# Patient Record
Sex: Male | Born: 1965 | Race: Black or African American | Hispanic: No | Marital: Single | Smoking: Current every day smoker
Health system: Southern US, Community
[De-identification: ages and names within clinical notes are randomized; demographics above are authoritative.]

## PROBLEM LIST (undated history)

## (undated) DIAGNOSIS — D649 Anemia, unspecified: Secondary | ICD-10-CM

## (undated) DIAGNOSIS — Z992 Dependence on renal dialysis: Secondary | ICD-10-CM

## (undated) DIAGNOSIS — Z972 Presence of dental prosthetic device (complete) (partial): Secondary | ICD-10-CM

## (undated) DIAGNOSIS — I10 Essential (primary) hypertension: Secondary | ICD-10-CM

## (undated) DIAGNOSIS — N186 End stage renal disease: Secondary | ICD-10-CM

## (undated) DIAGNOSIS — N189 Chronic kidney disease, unspecified: Secondary | ICD-10-CM

## (undated) DIAGNOSIS — Z9289 Personal history of other medical treatment: Secondary | ICD-10-CM

## (undated) DIAGNOSIS — R0600 Dyspnea, unspecified: Secondary | ICD-10-CM

## (undated) DIAGNOSIS — F1010 Alcohol abuse, uncomplicated: Secondary | ICD-10-CM

## (undated) DIAGNOSIS — N529 Male erectile dysfunction, unspecified: Secondary | ICD-10-CM

## (undated) HISTORY — PX: NO PAST SURGERIES: SHX2092

---

## 2000-02-24 ENCOUNTER — Encounter: Payer: Self-pay | Admitting: *Deleted

## 2000-02-24 ENCOUNTER — Emergency Department (HOSPITAL_COMMUNITY): Admission: EM | Admit: 2000-02-24 | Discharge: 2000-02-24 | Payer: Self-pay | Admitting: *Deleted

## 2000-02-27 ENCOUNTER — Ambulatory Visit (HOSPITAL_BASED_OUTPATIENT_CLINIC_OR_DEPARTMENT_OTHER): Admission: RE | Admit: 2000-02-27 | Discharge: 2000-02-27 | Payer: Self-pay | Admitting: *Deleted

## 2002-01-17 ENCOUNTER — Emergency Department (HOSPITAL_COMMUNITY): Admission: EM | Admit: 2002-01-17 | Discharge: 2002-01-17 | Payer: Self-pay | Admitting: Emergency Medicine

## 2002-01-17 ENCOUNTER — Encounter: Payer: Self-pay | Admitting: Emergency Medicine

## 2013-01-31 ENCOUNTER — Encounter (HOSPITAL_COMMUNITY): Payer: Self-pay | Admitting: Emergency Medicine

## 2013-01-31 ENCOUNTER — Emergency Department (HOSPITAL_COMMUNITY)
Admission: EM | Admit: 2013-01-31 | Discharge: 2013-02-01 | Disposition: A | Discharge status: Home / Self Care | Payer: Self-pay | Attending: Emergency Medicine | Admitting: Emergency Medicine

## 2013-01-31 DIAGNOSIS — W19XXXA Unspecified fall, initial encounter: Secondary | ICD-10-CM

## 2013-01-31 DIAGNOSIS — S0003XA Contusion of scalp, initial encounter: Secondary | ICD-10-CM | POA: Insufficient documentation

## 2013-01-31 DIAGNOSIS — F1010 Alcohol abuse, uncomplicated: Secondary | ICD-10-CM | POA: Insufficient documentation

## 2013-01-31 DIAGNOSIS — S0101XA Laceration without foreign body of scalp, initial encounter: Secondary | ICD-10-CM

## 2013-01-31 DIAGNOSIS — S0100XA Unspecified open wound of scalp, initial encounter: Secondary | ICD-10-CM | POA: Insufficient documentation

## 2013-01-31 DIAGNOSIS — S199XXA Unspecified injury of neck, initial encounter: Secondary | ICD-10-CM | POA: Insufficient documentation

## 2013-01-31 DIAGNOSIS — S0993XA Unspecified injury of face, initial encounter: Secondary | ICD-10-CM | POA: Insufficient documentation

## 2013-01-31 DIAGNOSIS — F10929 Alcohol use, unspecified with intoxication, unspecified: Secondary | ICD-10-CM

## 2013-01-31 DIAGNOSIS — IMO0002 Reserved for concepts with insufficient information to code with codable children: Secondary | ICD-10-CM | POA: Insufficient documentation

## 2013-01-31 DIAGNOSIS — M25521 Pain in right elbow: Secondary | ICD-10-CM

## 2013-01-31 DIAGNOSIS — S060X1A Concussion with loss of consciousness of 30 minutes or less, initial encounter: Secondary | ICD-10-CM | POA: Insufficient documentation

## 2013-01-31 DIAGNOSIS — S1093XA Contusion of unspecified part of neck, initial encounter: Secondary | ICD-10-CM | POA: Insufficient documentation

## 2013-01-31 DIAGNOSIS — Y9389 Activity, other specified: Secondary | ICD-10-CM | POA: Insufficient documentation

## 2013-01-31 DIAGNOSIS — R040 Epistaxis: Secondary | ICD-10-CM | POA: Insufficient documentation

## 2013-01-31 DIAGNOSIS — Y92410 Unspecified street and highway as the place of occurrence of the external cause: Secondary | ICD-10-CM | POA: Insufficient documentation
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
## 2013-02-01 ENCOUNTER — Emergency Department (HOSPITAL_COMMUNITY): Payer: Self-pay

## 2013-02-01 LAB — COMPREHENSIVE METABOLIC PANEL
ALT: 79 U/L — ABNORMAL HIGH (ref 0–53)
AST: 52 U/L — ABNORMAL HIGH (ref 0–37)
Albumin: 3.5 g/dL (ref 3.5–5.2)
Alkaline Phosphatase: 84 U/L (ref 39–117)
BUN: 24 mg/dL — ABNORMAL HIGH (ref 6–23)
CO2: 21 mEq/L (ref 19–32)
Calcium: 9.5 mg/dL (ref 8.4–10.5)
Chloride: 105 mEq/L (ref 96–112)
Creatinine, Ser: 2.81 mg/dL — ABNORMAL HIGH (ref 0.50–1.35)
GFR calc Af Amer: 30 mL/min — ABNORMAL LOW (ref >90)
GFR calc non Af Amer: 26 mL/min — ABNORMAL LOW (ref >90)
Glucose, Bld: 83 mg/dL (ref 70–99)
Potassium: 4.2 mEq/L (ref 3.5–5.1)
Sodium: 141 mEq/L (ref 135–145)
Total Bilirubin: 0.6 mg/dL (ref 0.3–1.2)
Total Protein: 7.3 g/dL (ref 6.0–8.3)

## 2013-02-01 LAB — CBC WITH DIFFERENTIAL/PLATELET
Basophils Absolute: 0.1 10*3/uL (ref 0.0–0.1)
Basophils Relative: 0 % (ref 0–1)
Eosinophils Absolute: 0.2 10*3/uL (ref 0.0–0.7)
Eosinophils Relative: 1 % (ref 0–5)
HCT: 43.7 % (ref 39.0–52.0)
Hemoglobin: 15.5 g/dL (ref 13.0–17.0)
Lymphocytes Relative: 6 % — ABNORMAL LOW (ref 12–46)
Lymphs Abs: 0.9 10*3/uL (ref 0.7–4.0)
MCH: 34.9 pg — ABNORMAL HIGH (ref 26.0–34.0)
MCHC: 35.5 g/dL (ref 30.0–36.0)
MCV: 98.4 fL (ref 78.0–100.0)
Monocytes Absolute: 0.9 10*3/uL (ref 0.1–1.0)
Monocytes Relative: 6 % (ref 3–12)
Neutro Abs: 13.5 10*3/uL — ABNORMAL HIGH (ref 1.7–7.7)
Neutrophils Relative %: 87 % — ABNORMAL HIGH (ref 43–77)
Platelets: 169 10*3/uL (ref 150–400)
RBC: 4.44 MIL/uL (ref 4.22–5.81)
RDW: 12.4 % (ref 11.5–15.5)
WBC: 15.6 10*3/uL — ABNORMAL HIGH (ref 4.0–10.5)

## 2013-02-01 LAB — ETHANOL: Alcohol, Ethyl (B): 125 mg/dL — ABNORMAL HIGH (ref 0–11)

## 2013-02-01 IMAGING — CR DG PELVIS 1-2V
1 series · 1 of 1 positions shown · non-contrast
Comparison: None.

CLINICAL DATA: Fall

PELVIS - 1-2 VIEW

[t pelvis ap]
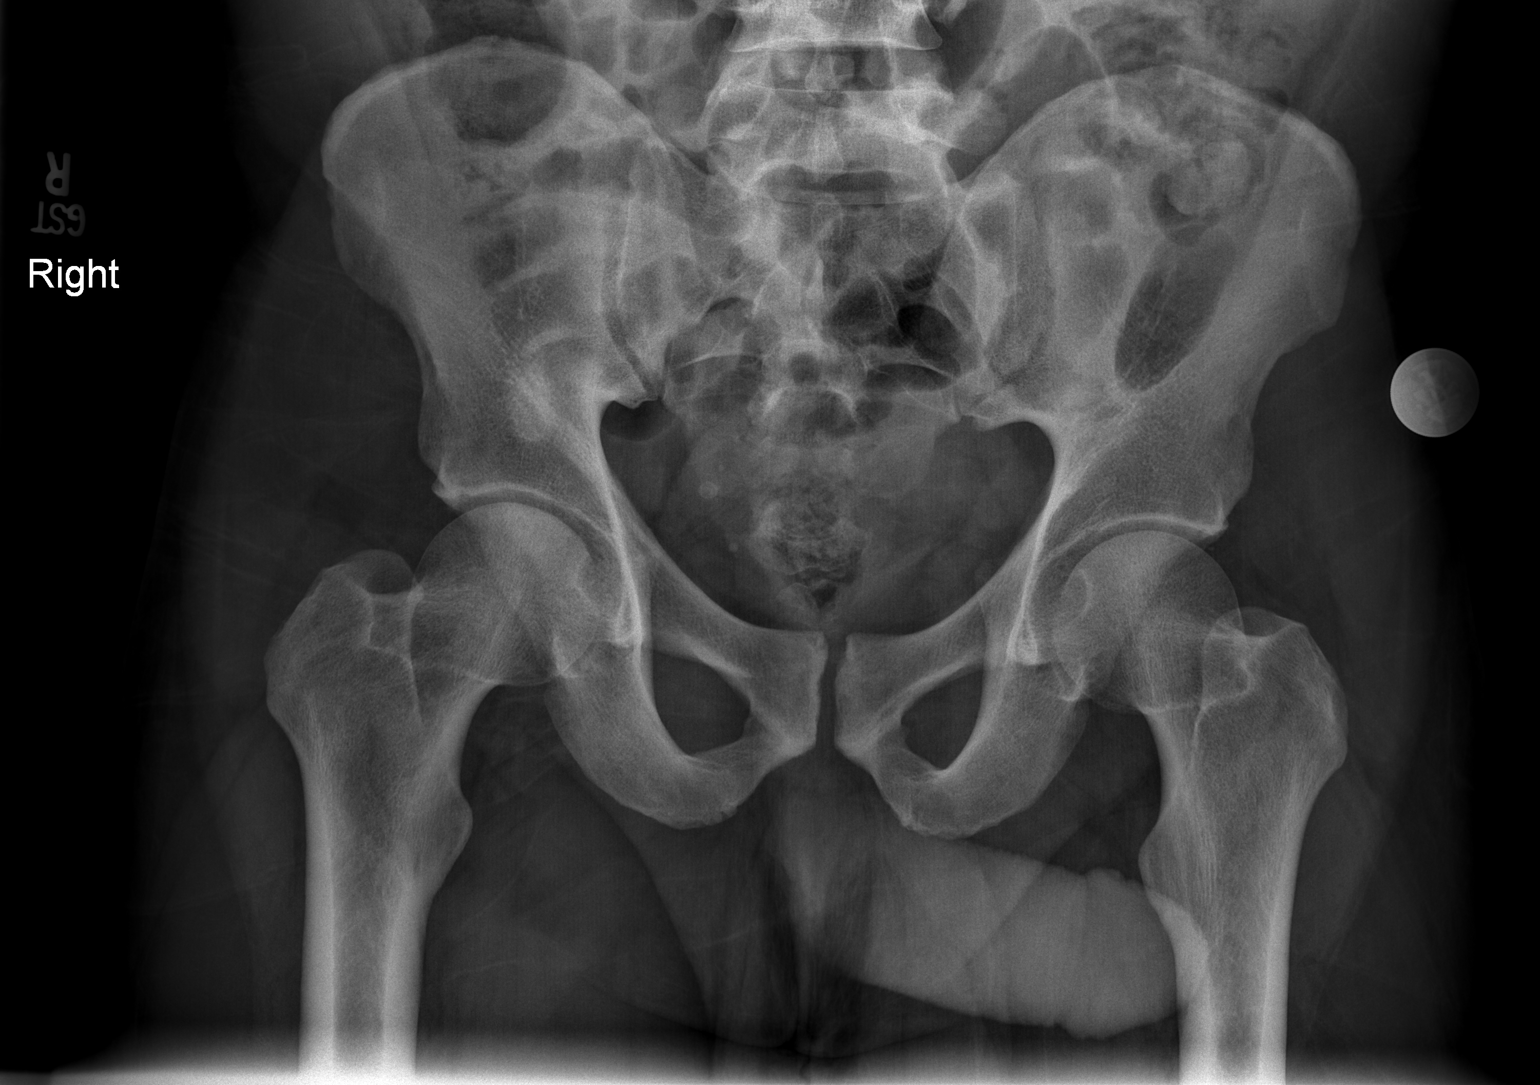

[1 of 1 positions shown; findings below may reference images not displayed]

FINDINGS: No displaced fracture.  Femoral heads project over the
acetabula.  There is an oval sclerotic focus projecting over the
right iliac bone.
IMPRESSION: No displaced fracture identified on this single view of the pelvis.
Recommend dedicated views if there is a focal area of concern.

Oval sclerotic focus right iliac bone. If no outside prior imaging
is available for comparison, consider bone scan to further
evaluate.

## 2013-02-01 IMAGING — CR DG ELBOW COMPLETE 3+V*R*
5 series · 5 of 5 positions shown · non-contrast
Comparison: None.

CLINICAL DATA: Fall, pain, laceration.

RIGHT ELBOW - COMPLETE 3+ VIEW

[x elbow ap right]
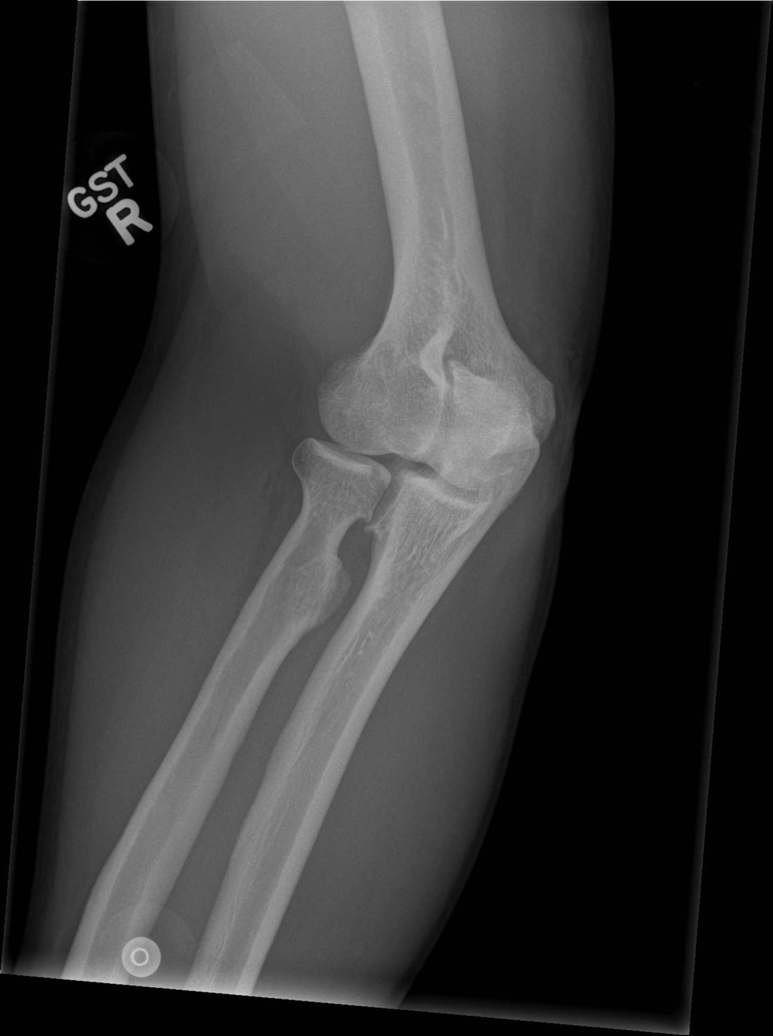

[x elbow obl right (1 of 2)]
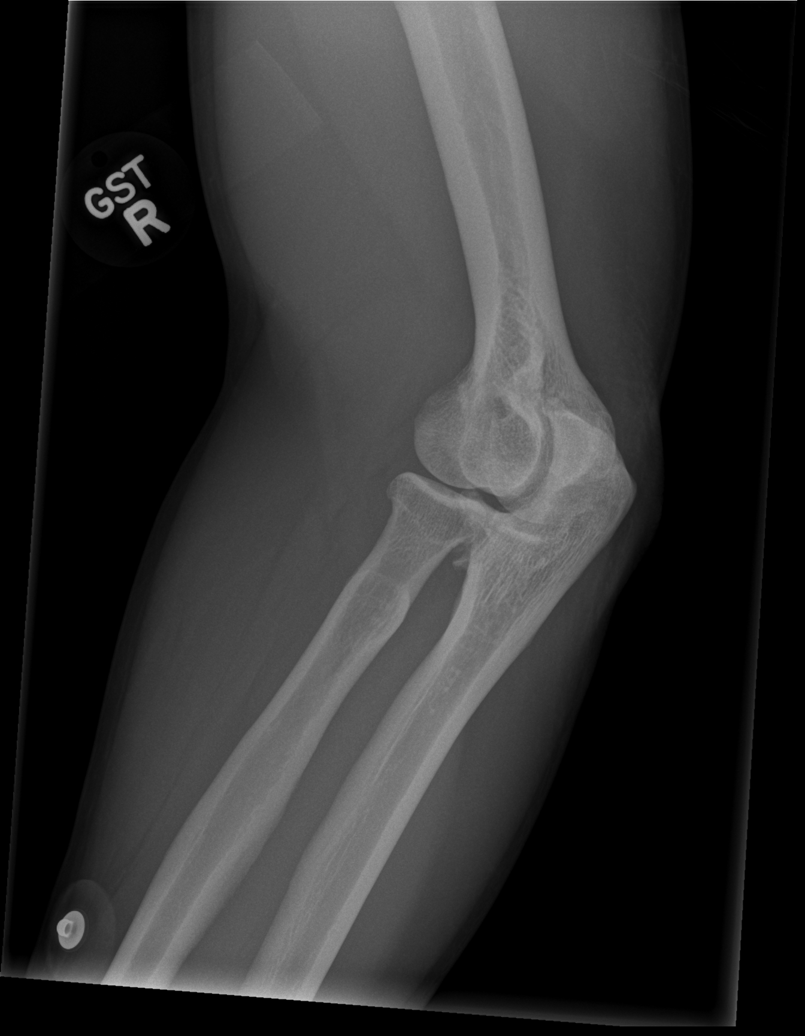

[x elbow obl right (2 of 2)]
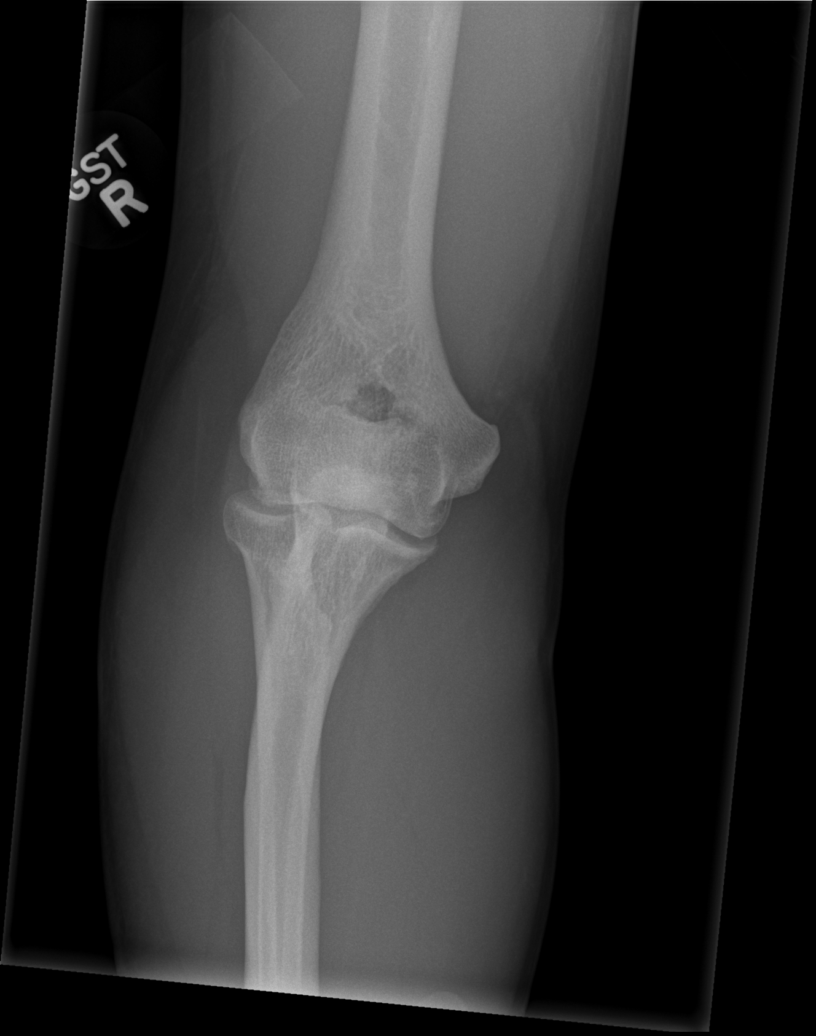

[x elbow lat right (1 of 2)]
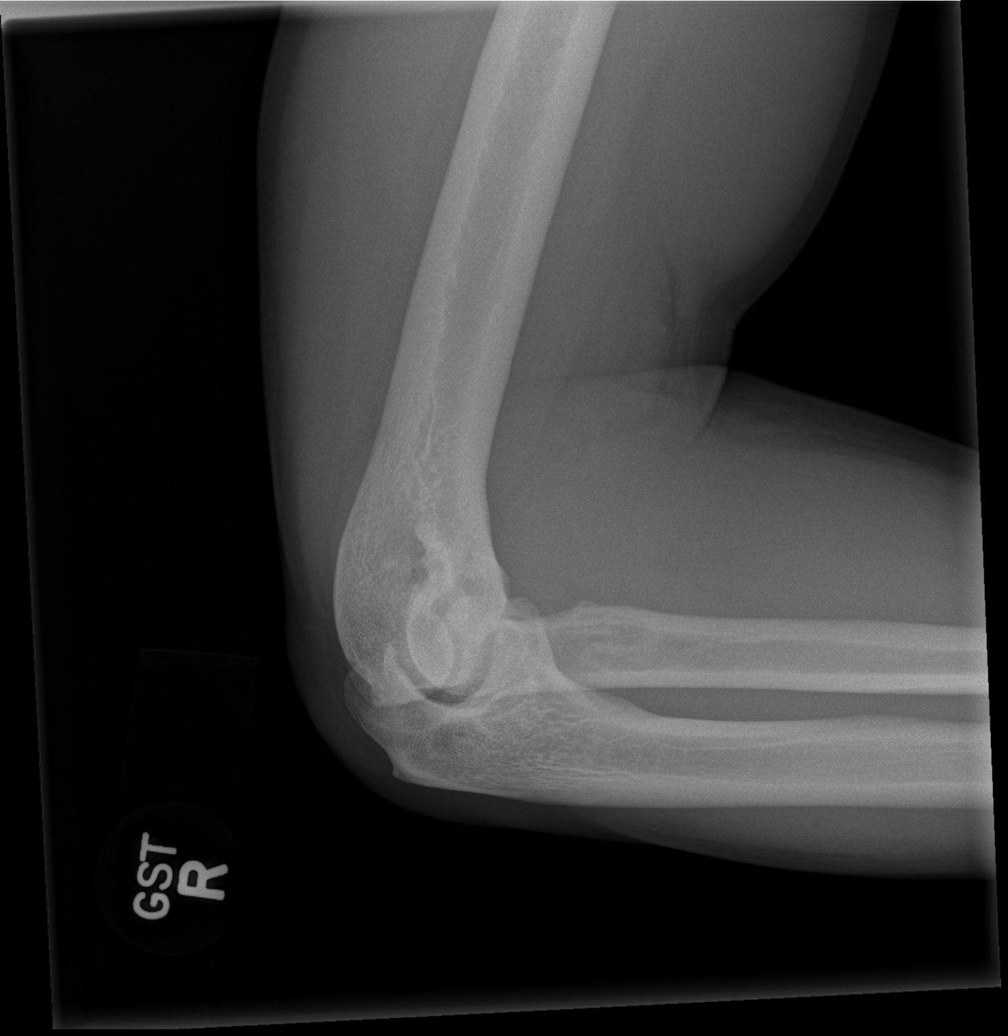

[x elbow lat right (2 of 2)]
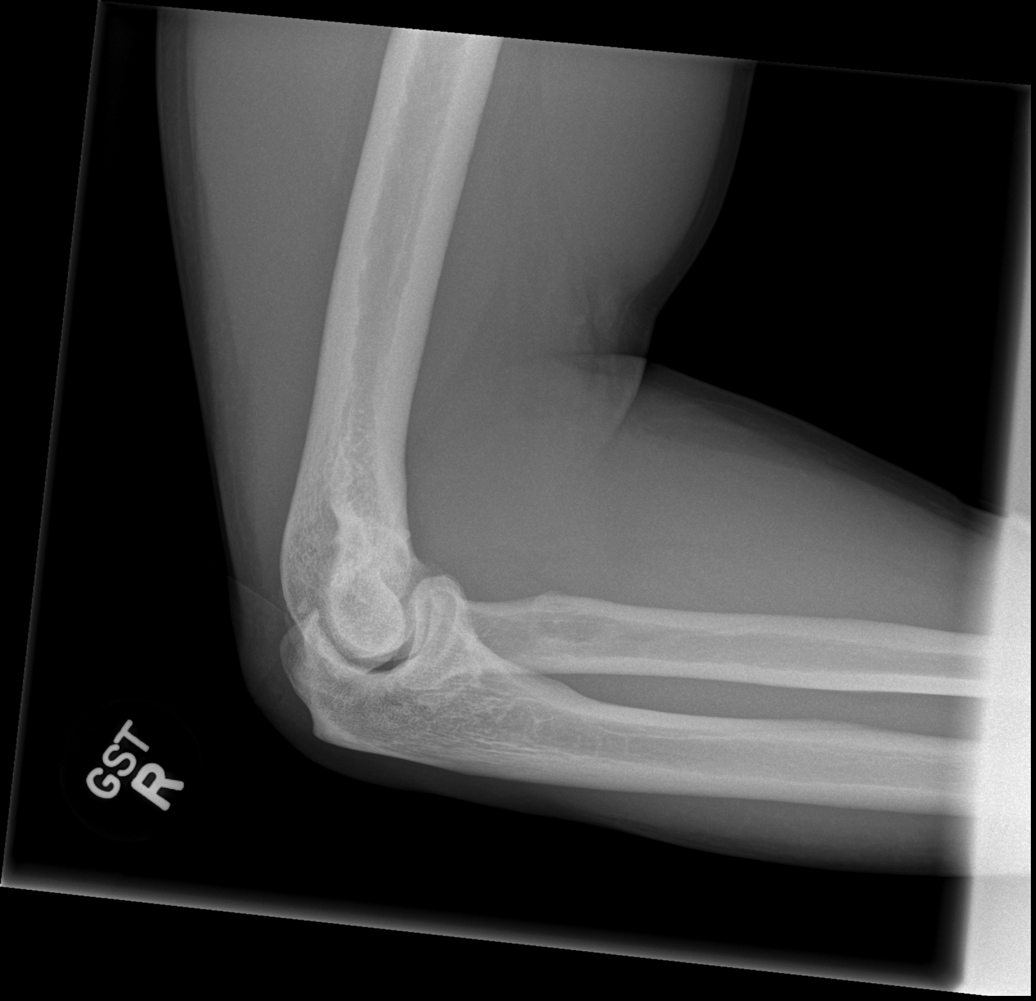

[5 of 5 positions shown; findings below may reference images not displayed]

FINDINGS: The lateral view is suboptimal despite repeating.  It is
difficult to assess for joint effusion.  There is a linear lucency
within the radial head.  Cannot exclude nondisplaced radial head
fracture.  Consider immobilization and repeat imaging if symptoms
persist.
IMPRESSION: Linear lucency in the radial head.  Cannot exclude nondisplaced
radial head fracture.  Consider immobilization and repeat imaging
if symptoms persist.

## 2013-02-01 IMAGING — CR DG SACRUM/COCCYX 2+V
3 series · 3 of 3 positions shown · non-contrast
Comparison: Contemporaneous pelvis and lumbar spine radiographs

CLINICAL DATA: Fall, pain.

SACRUM AND COCCYX - 2+ VIEW

[t sacrum ap]
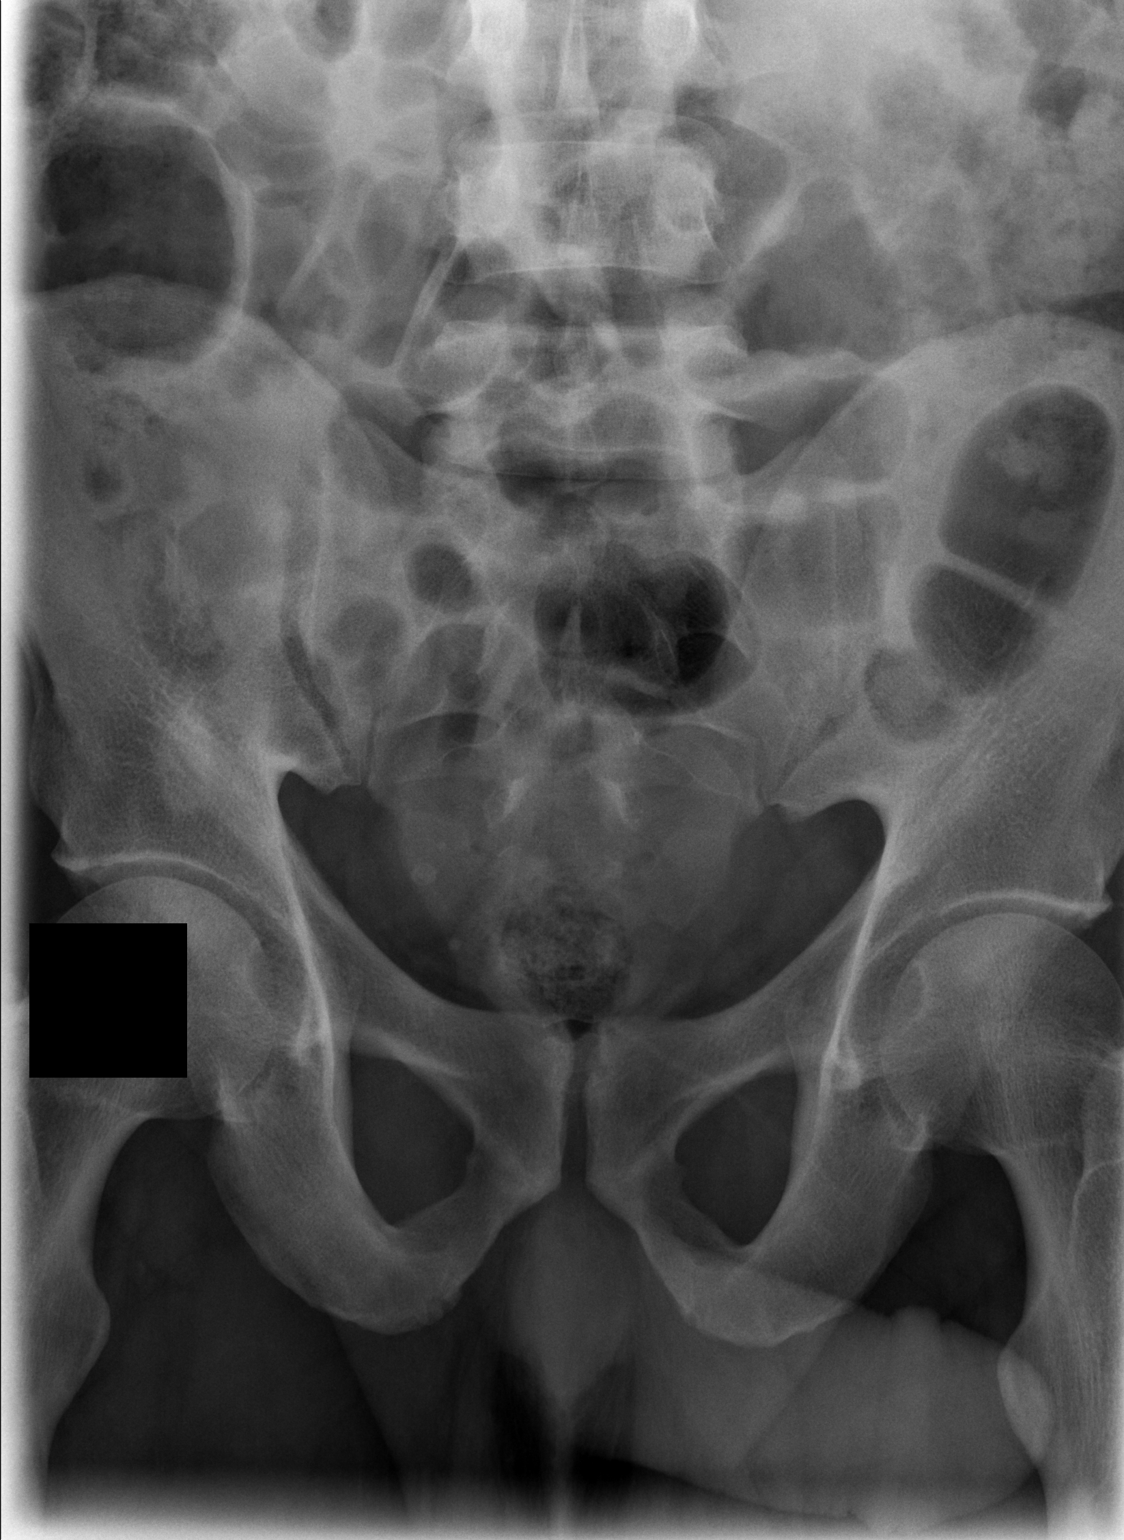

[t coccyx ap]
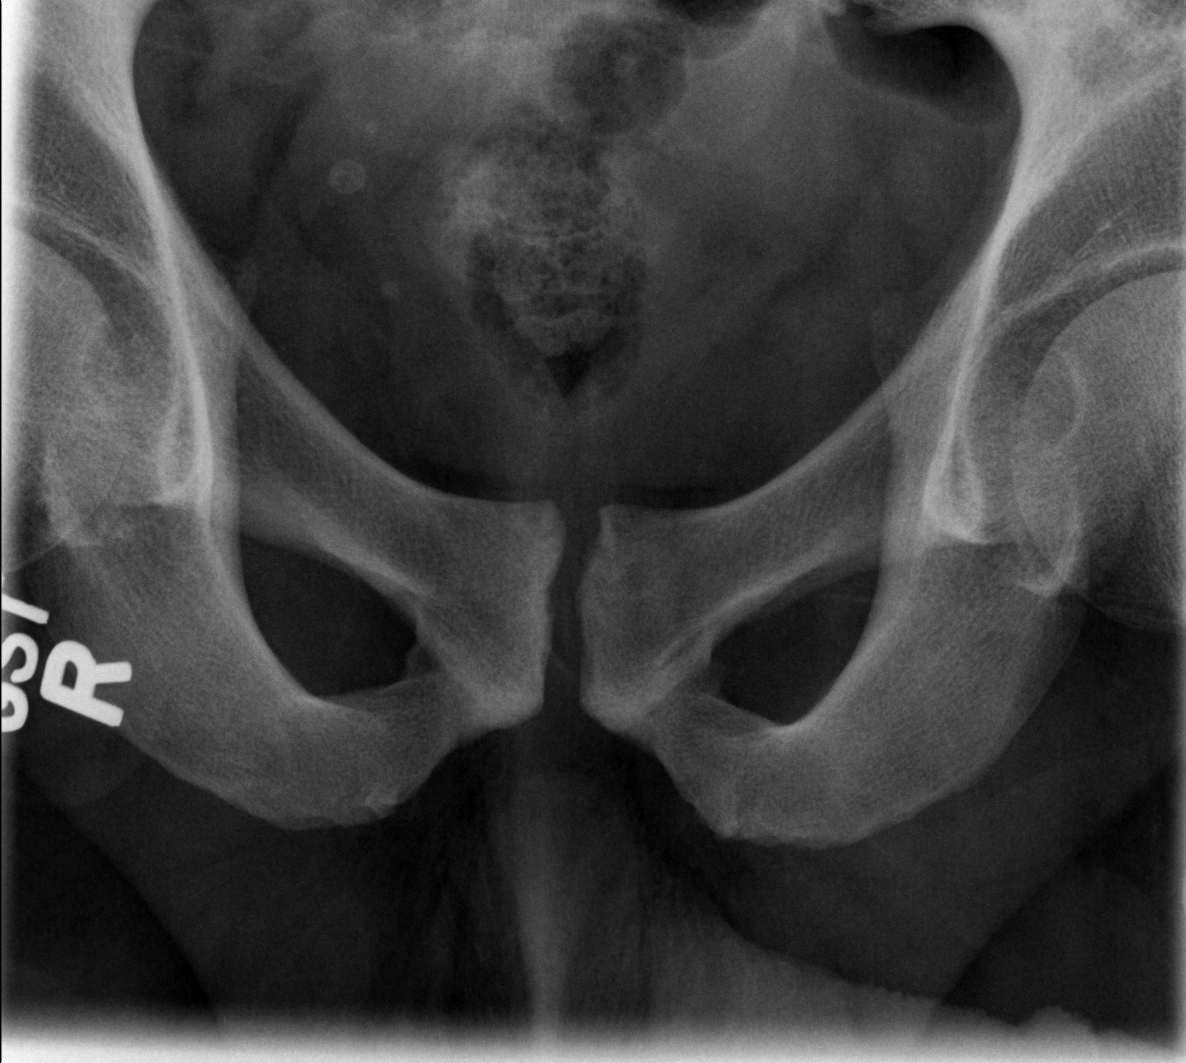

[t sacrum coccyx lat]
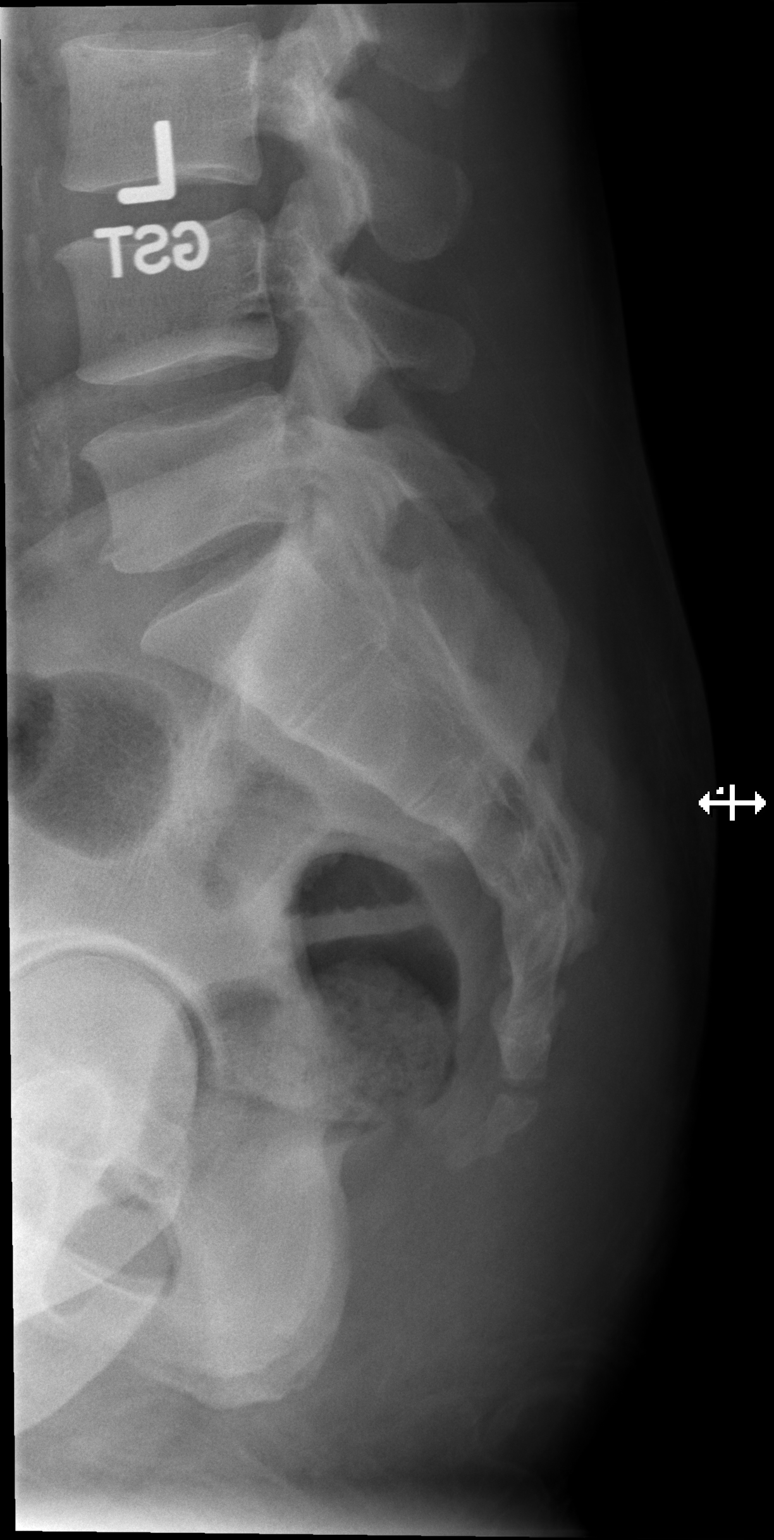

[3 of 3 positions shown; findings below may reference images not displayed]

FINDINGS: No displaced fracture.  No aggressive osseous lesions.
IMPRESSION: No displaced fracture identified.

Consider cross-sectional imaging to exclude a nondisplaced fracture
if management will change.

## 2013-02-01 IMAGING — CR DG LUMBAR SPINE COMPLETE 4+V
5 series · 5 of 5 positions shown · non-contrast
Comparison: None

CLINICAL DATA: Fall, lower back pain and coccyx pain.

LUMBAR SPINE - COMPLETE 4+ VIEW

[t lumbar spine ap]
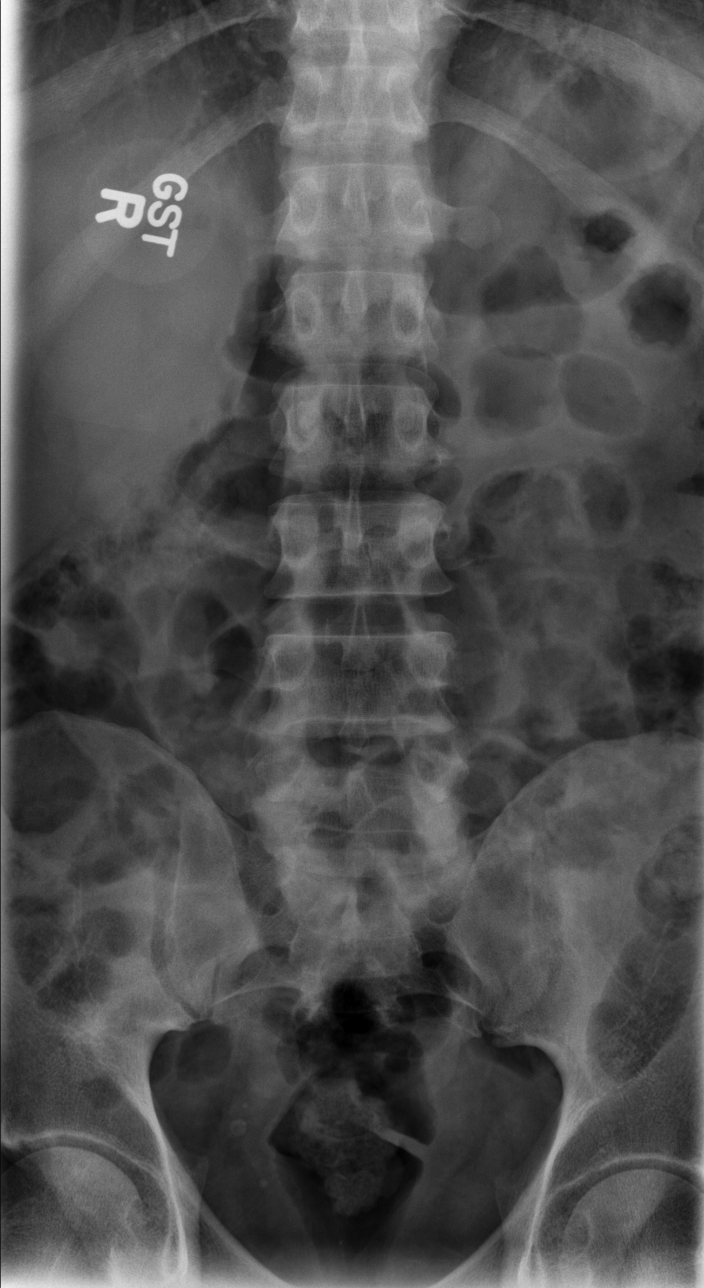

[t lumbar spine obl (1 of 2)]
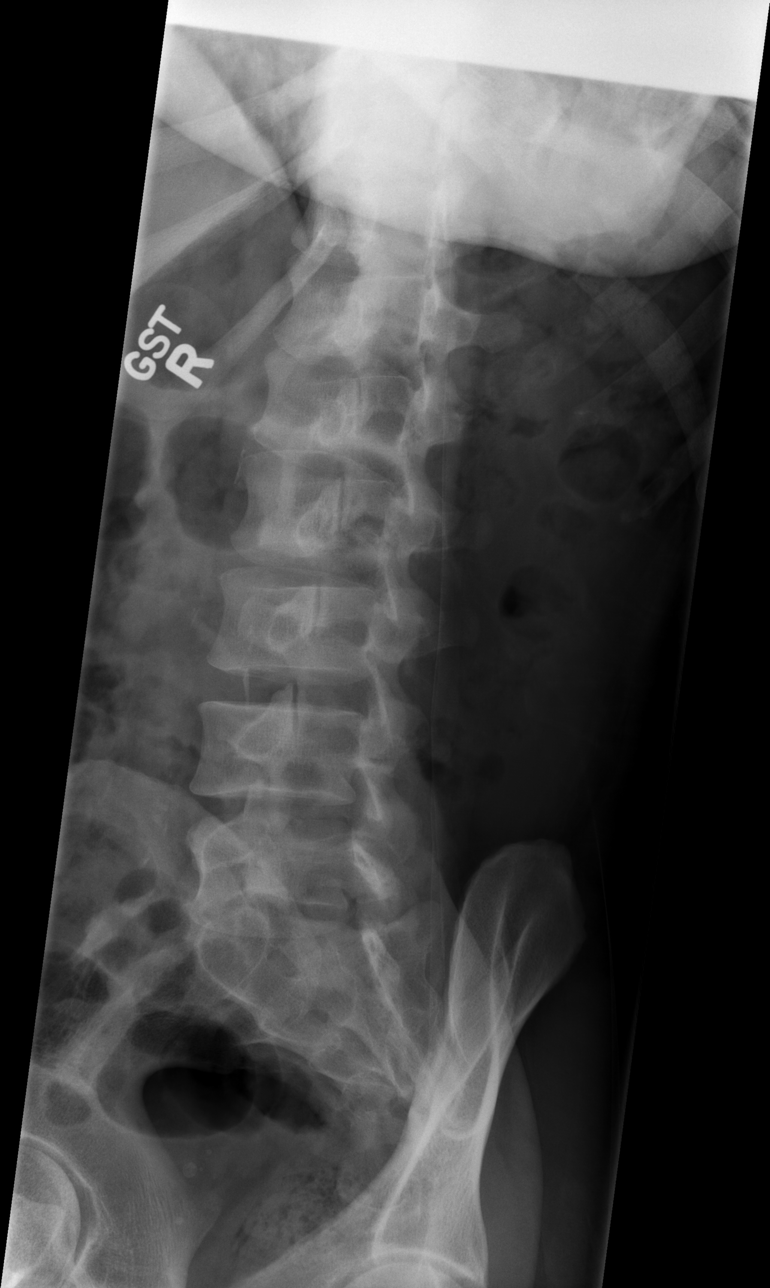

[t lumbar spine obl (2 of 2)]
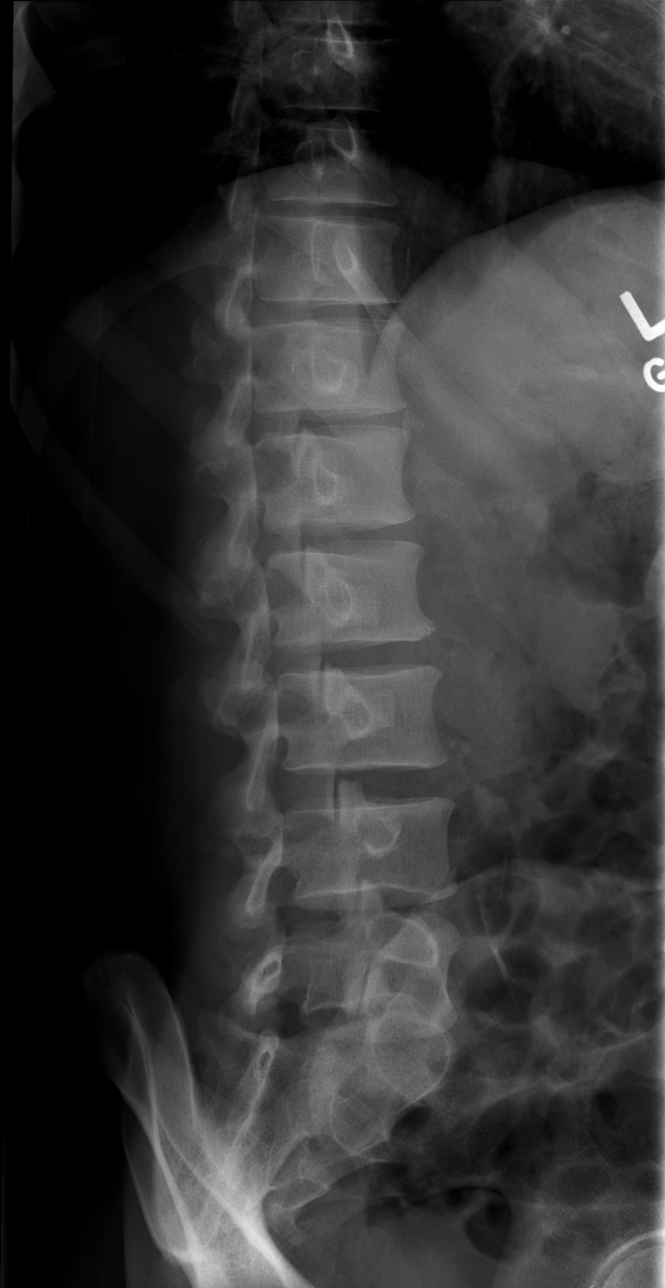

[t lumbar spine lat]
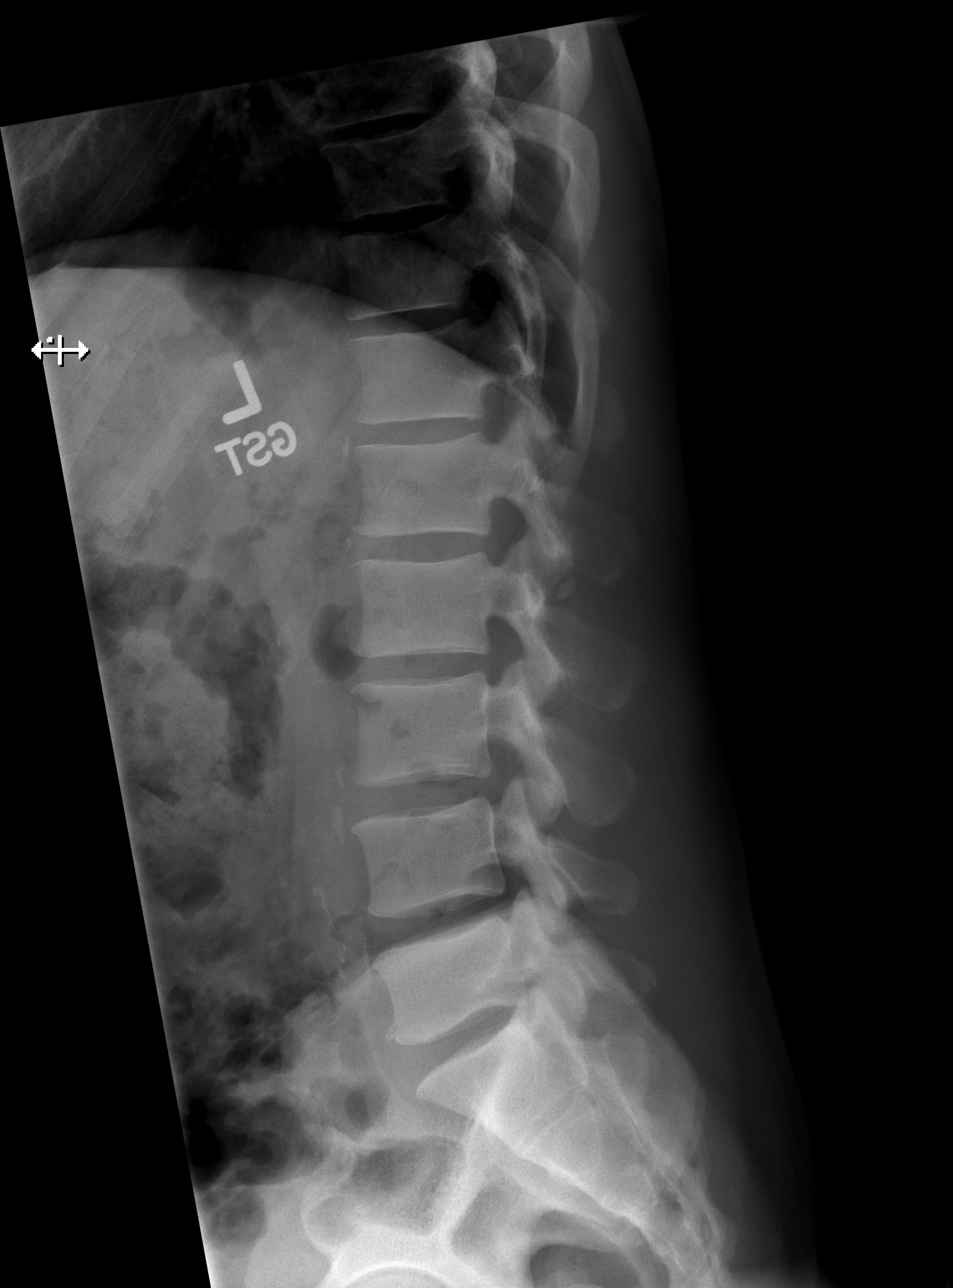

[t lumbar l-5 s-1 spot]
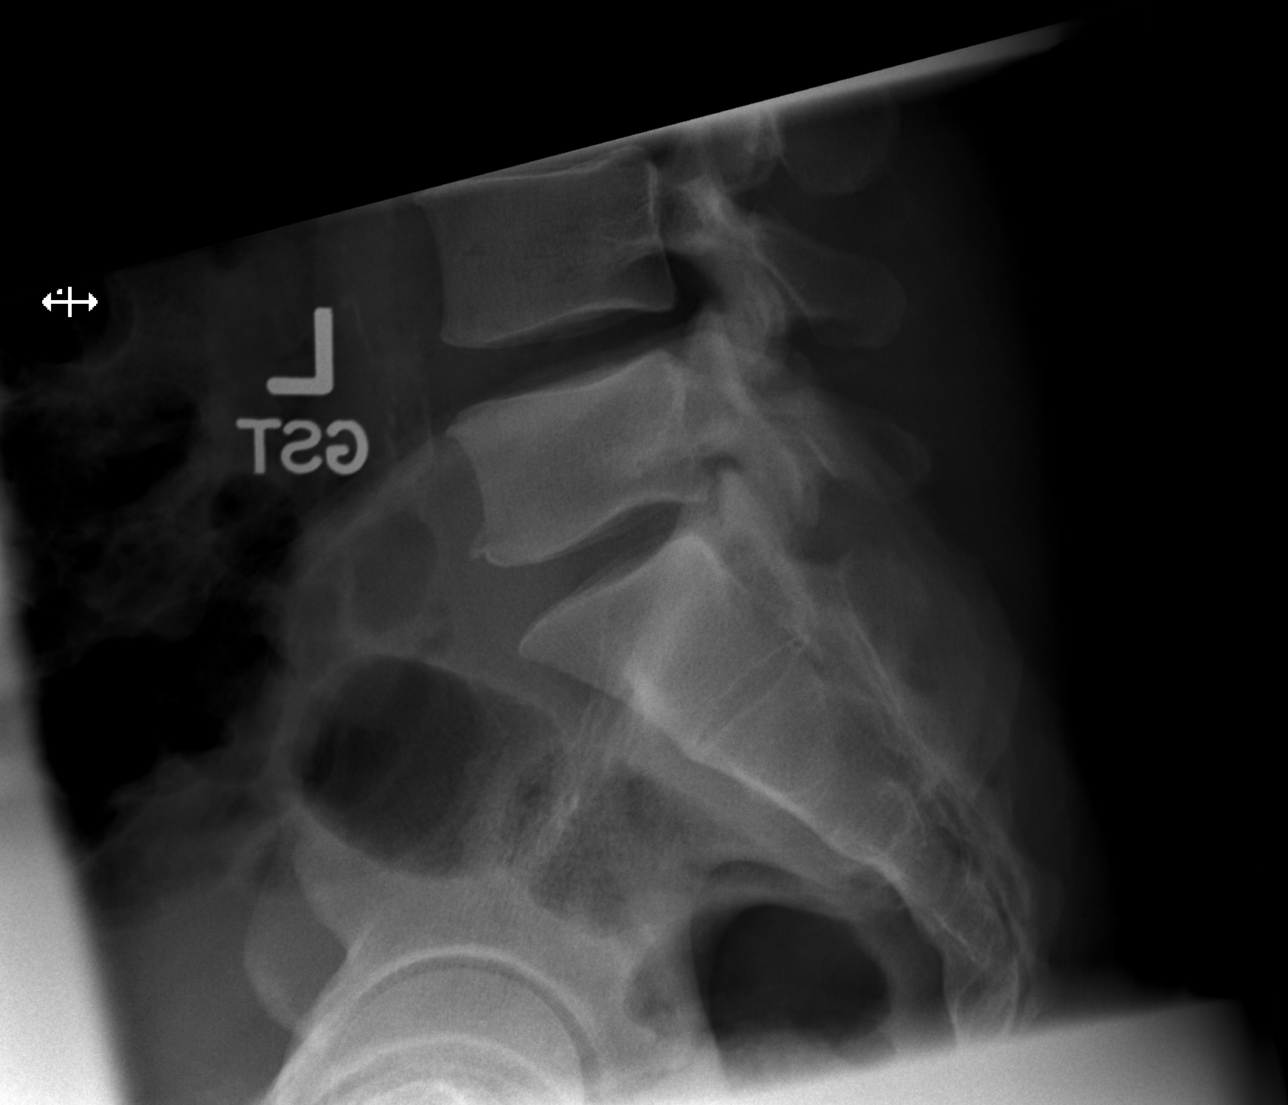

[5 of 5 positions shown; findings below may reference images not displayed]

FINDINGS: The imaged vertebral bodies and inter-vertebral disc
spaces are maintained. No displaced acute fracture or dislocation
identified. Mild lower lumbar and lumbosacral degenerative changes
Atherosclerotic vascular calcifications.  Otherwise, the  para-
vertebral and overlying soft tissues are within normal limits.
IMPRESSION: Mild lower lumbar degenerative changes.  No acute osseous finding
of the lumbar spine

## 2013-02-01 IMAGING — CT CT HEAD W/O CM
4 of 8 series · 17 of 47 positions shown, 19 images · non-contrast
Comparison: None.

CT HEAD

CLINICAL DATA: Fall, hit head.  Neck pain.  Laceration and
swelling to back of head.

CT HEAD WITHOUT CONTRAST
CT MAXILLOFACIAL WITHOUT CONTRAST
CT CERVICAL SPINE WITHOUT CONTRAST
TECHNIQUE: Multidetector CT imaging of the head, cervical spine,
and maxillofacial structures were performed using the standard
protocol without intravenous contrast. Multiplanar CT image
reconstructions of the cervical spine and maxillofacial structures
were also generated.

[Series 6: facial 2.0 h31s st · axial · 0.32mm/px · z∈[-245,-93]mm · 8 of 98 slices shown, 10 images]
[im 11/98  brain]
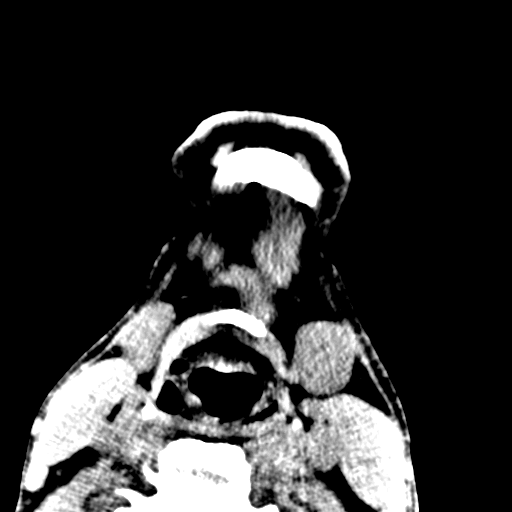
[im 11/98  bone]
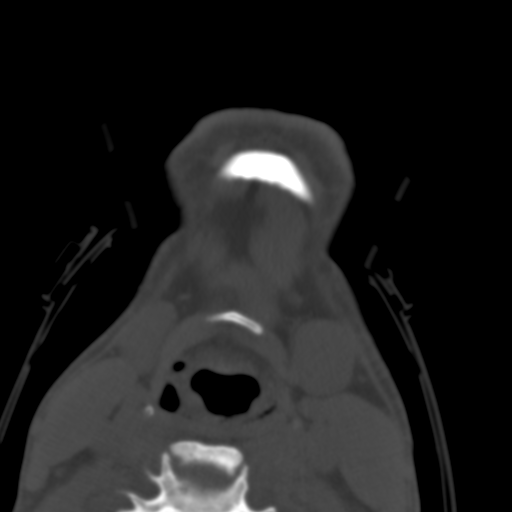
[im 22/98  brain]
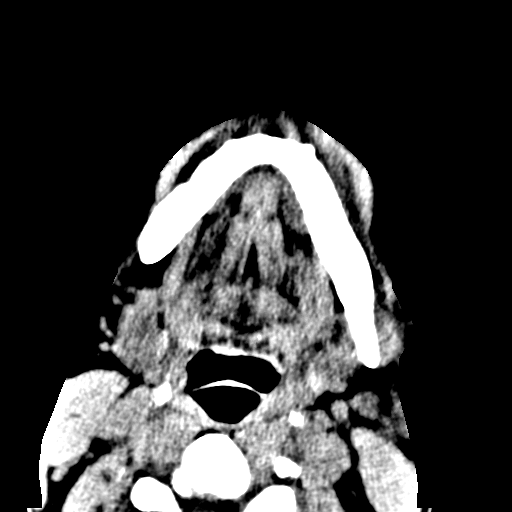
[im 33/98  brain]
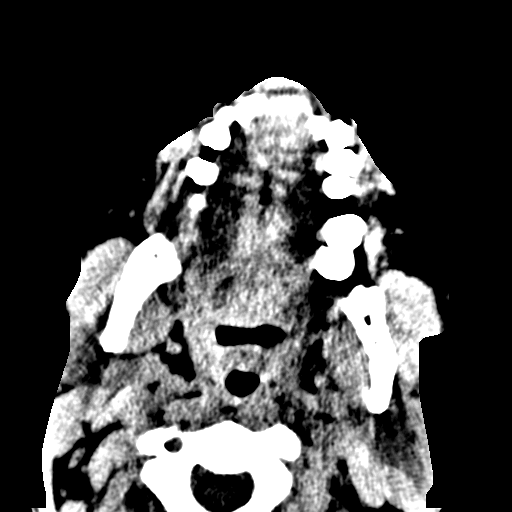
[im 44/98  brain]
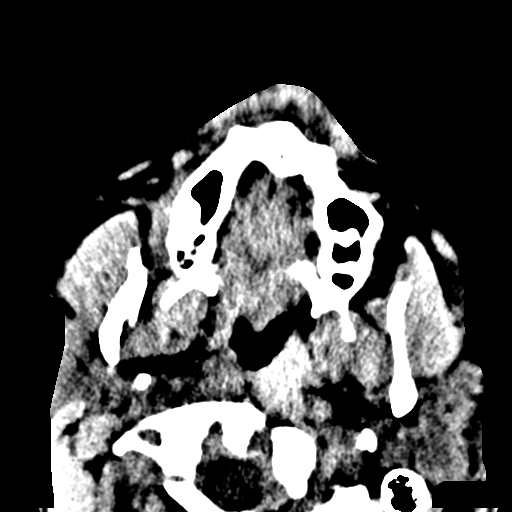
[im 54/98  brain]
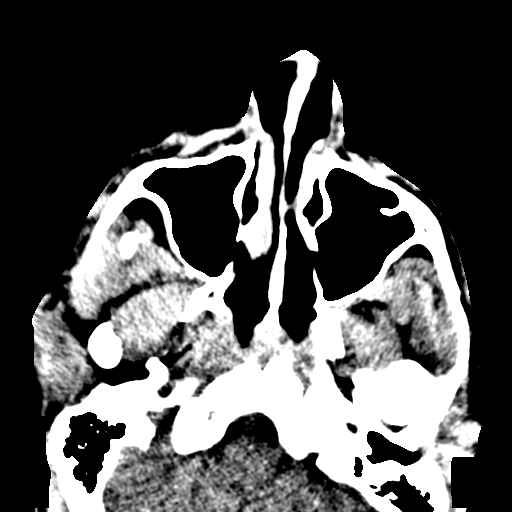
[im 54/98  bone]
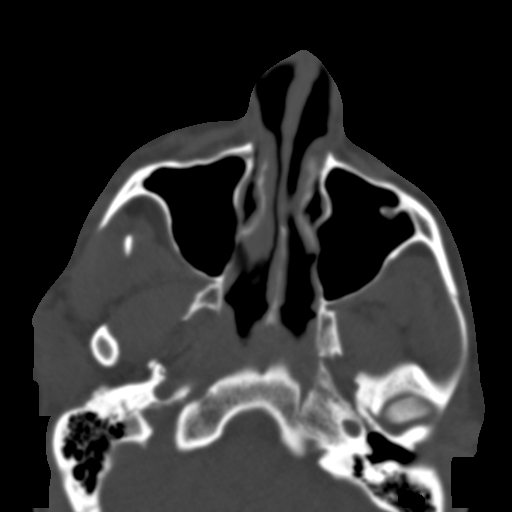
[im 65/98  brain]
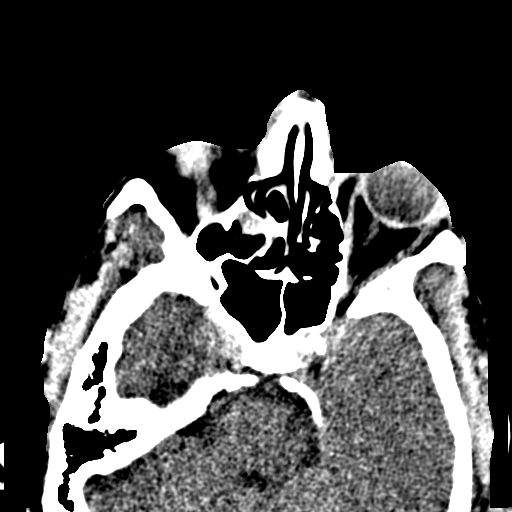
[im 76/98  brain]
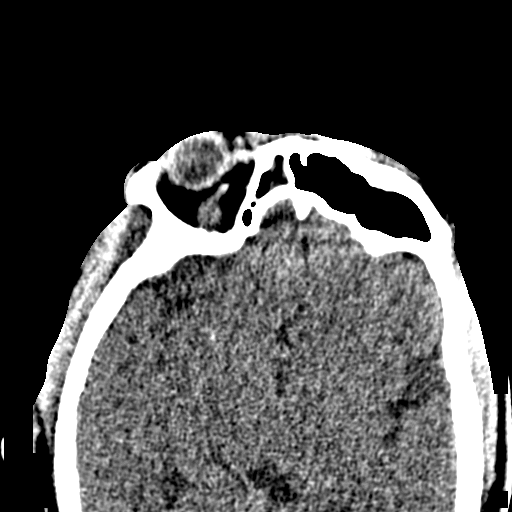
[im 87/98  brain]
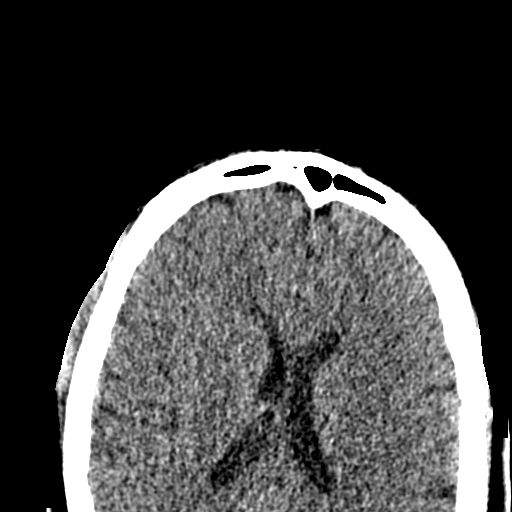

[Series 602: cor · coronal · 0.38mm/px · 3 of 68 slices shown]
[im 14/68  brain]
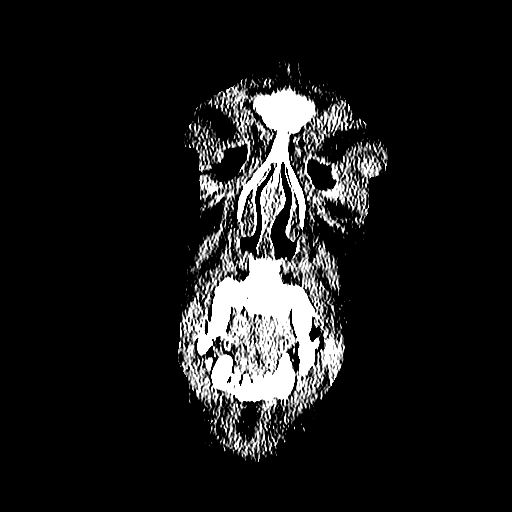
[im 27/68  brain]
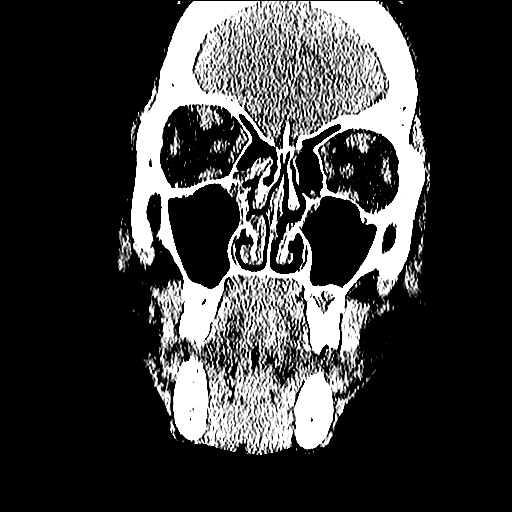
[im 41/68  brain]
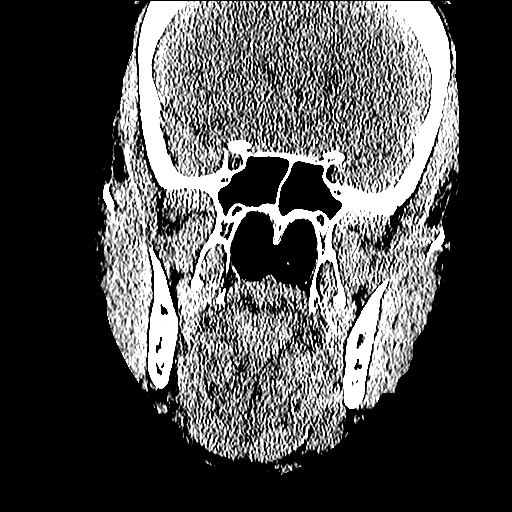

[Series 603: sag · sagittal · 0.38mm/px · 2 of 79 slices shown]
[im 27/79  brain]
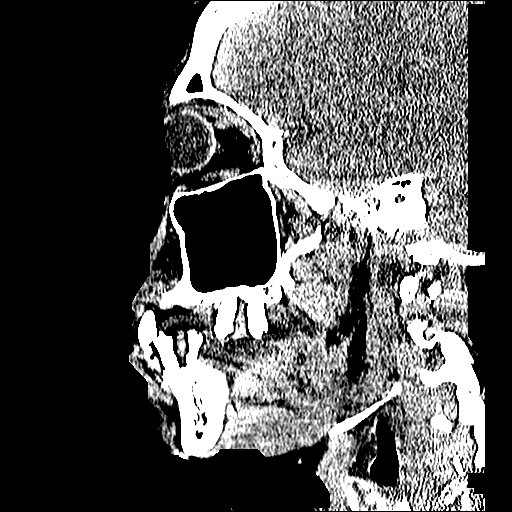
[im 53/79  brain]
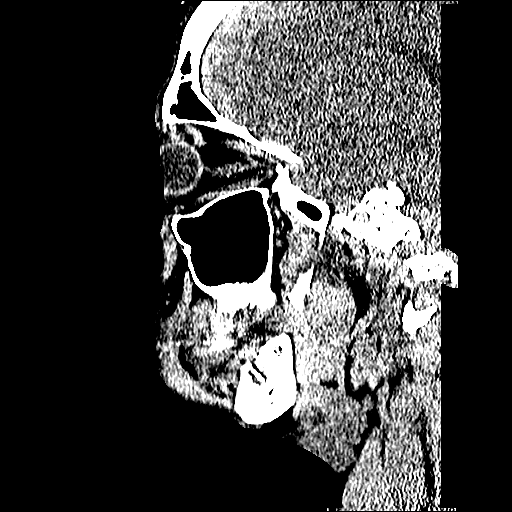

[Series 604: axial · axial · 0.33mm/px · z∈[-310,-241]mm · 4 of 83 slices shown]
[im 12/83  brain]
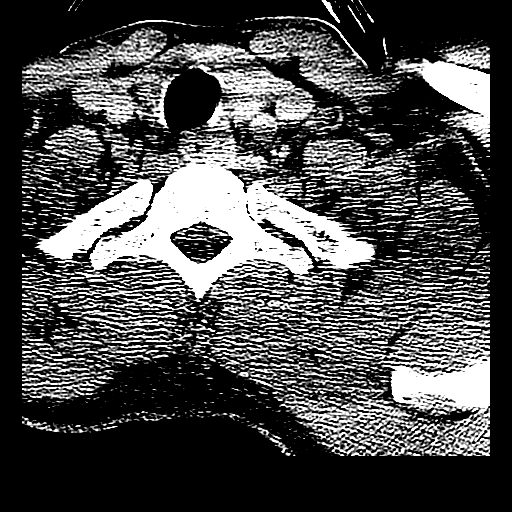
[im 24/83  brain]
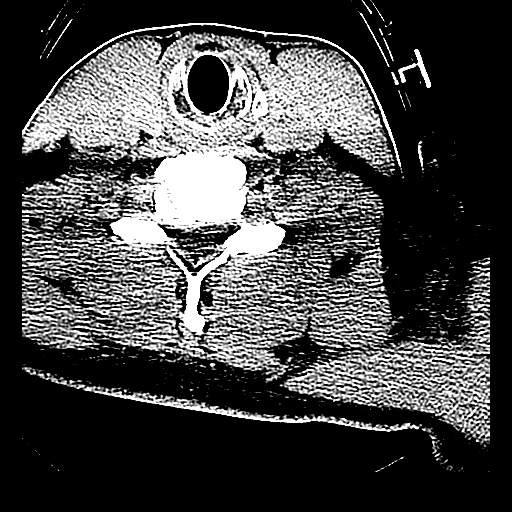
[im 36/83  brain]
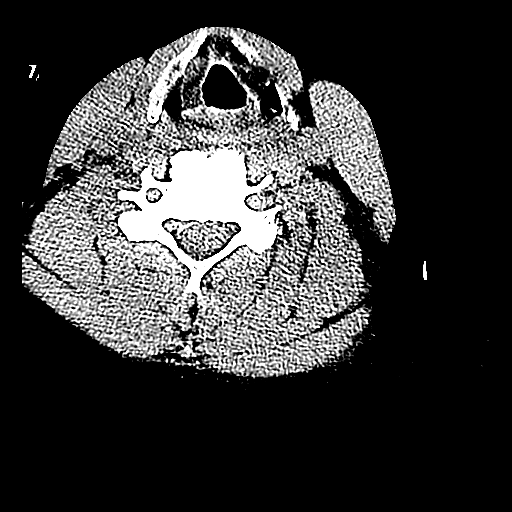
[im 47/83  brain]
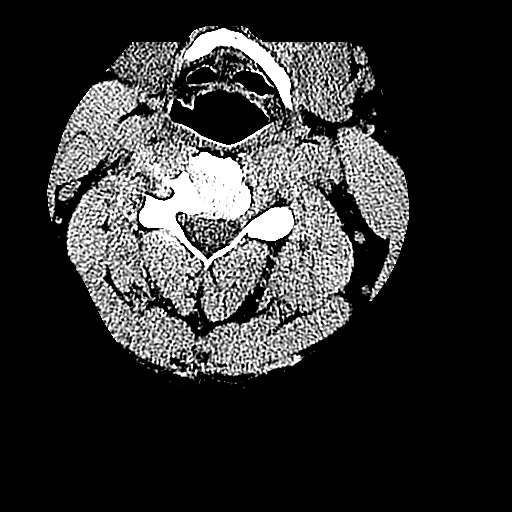

[17 of 47 positions shown; findings below may reference images not displayed]

FINDINGS: Soft tissue swelling and laceration noted along the
posterior scalp.  No underlying calvarial abnormality. No acute
intracranial abnormality.  Specifically, no hemorrhage,
hydrocephalus, mass lesion, acute infarction, or significant
intracranial injury.  No acute calvarial abnormality. Visualized
mastoids clear.  Orbital soft tissues unremarkable.
IMPRESSION: No acute intracranial abnormality.

CT MAXILLOFACIAL
FINDINGS: Zygomatic arches and orbital walls are intact.  Mandible
intact.  No facial fracture.  Orbital soft tissues are
unremarkable. Minimal mucosal thickening within the right maxillary
sinus and scattered ethmoid air cells.
IMPRESSION: No evidence of facial fracture.

CT CERVICAL SPINE
FINDINGS: Degenerative disc disease in the mid and lower cervical
spine with degenerative spurring and disc space narrowing.
Prevertebral soft tissues are normal.  Alignment normal.  No
fracture.  No epidural or paraspinal hematoma.
IMPRESSION: Degenerative changes.  No acute findings.

## 2013-02-01 MED ORDER — OXYCODONE-ACETAMINOPHEN 5-325 MG PO TABS: 2.0000 | ORAL_TABLET | ORAL | Status: DC | PRN

## 2013-02-01 MED ORDER — MORPHINE SULFATE 4 MG/ML IJ SOLN
4.0000 mg | Freq: Once | INTRAMUSCULAR | Status: AC
  Administered 2013-02-01: 4 mg via INTRAVENOUS
  Filled 2013-02-01: qty 1

## 2013-02-01 MED ORDER — TETANUS-DIPHTH-ACELL PERTUSSIS 5-2.5-18.5 LF-MCG/0.5 IM SUSP
0.5000 mL | Freq: Once | INTRAMUSCULAR | Status: AC
  Administered 2013-02-01: 0.5 mL via INTRAMUSCULAR
  Filled 2013-02-01: qty 0.5
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
## 2016-08-04 ENCOUNTER — Emergency Department (HOSPITAL_COMMUNITY): Payer: Medicaid Other

## 2016-08-04 ENCOUNTER — Inpatient Hospital Stay (HOSPITAL_COMMUNITY)
Admission: EM | Admit: 2016-08-04 | Discharge: 2016-08-12 | DRG: 673 | Disposition: A | Discharge status: Home / Self Care | Payer: Medicaid Other | Attending: Internal Medicine | Admitting: Internal Medicine

## 2016-08-04 ENCOUNTER — Other Ambulatory Visit (HOSPITAL_COMMUNITY): Payer: Self-pay

## 2016-08-04 ENCOUNTER — Inpatient Hospital Stay (HOSPITAL_COMMUNITY): Payer: Medicaid Other

## 2016-08-04 ENCOUNTER — Encounter (HOSPITAL_COMMUNITY): Payer: Self-pay | Admitting: *Deleted

## 2016-08-04 DIAGNOSIS — Z841 Family history of disorders of kidney and ureter: Secondary | ICD-10-CM | POA: Diagnosis not present

## 2016-08-04 DIAGNOSIS — Z23 Encounter for immunization: Secondary | ICD-10-CM

## 2016-08-04 DIAGNOSIS — I161 Hypertensive emergency: Secondary | ICD-10-CM | POA: Diagnosis not present

## 2016-08-04 DIAGNOSIS — R200 Anesthesia of skin: Secondary | ICD-10-CM | POA: Diagnosis not present

## 2016-08-04 DIAGNOSIS — D649 Anemia, unspecified: Secondary | ICD-10-CM | POA: Diagnosis present

## 2016-08-04 DIAGNOSIS — D509 Iron deficiency anemia, unspecified: Secondary | ICD-10-CM | POA: Diagnosis present

## 2016-08-04 DIAGNOSIS — F1020 Alcohol dependence, uncomplicated: Secondary | ICD-10-CM | POA: Diagnosis present

## 2016-08-04 DIAGNOSIS — N185 Chronic kidney disease, stage 5: Secondary | ICD-10-CM | POA: Diagnosis not present

## 2016-08-04 DIAGNOSIS — Z95828 Presence of other vascular implants and grafts: Secondary | ICD-10-CM

## 2016-08-04 DIAGNOSIS — F1010 Alcohol abuse, uncomplicated: Secondary | ICD-10-CM | POA: Diagnosis not present

## 2016-08-04 DIAGNOSIS — J441 Chronic obstructive pulmonary disease with (acute) exacerbation: Secondary | ICD-10-CM | POA: Diagnosis present

## 2016-08-04 DIAGNOSIS — J449 Chronic obstructive pulmonary disease, unspecified: Secondary | ICD-10-CM | POA: Diagnosis not present

## 2016-08-04 DIAGNOSIS — D638 Anemia in other chronic diseases classified elsewhere: Secondary | ICD-10-CM | POA: Diagnosis present

## 2016-08-04 DIAGNOSIS — N179 Acute kidney failure, unspecified: Secondary | ICD-10-CM | POA: Diagnosis not present

## 2016-08-04 DIAGNOSIS — I10 Essential (primary) hypertension: Secondary | ICD-10-CM | POA: Diagnosis not present

## 2016-08-04 DIAGNOSIS — F17210 Nicotine dependence, cigarettes, uncomplicated: Secondary | ICD-10-CM | POA: Diagnosis present

## 2016-08-04 DIAGNOSIS — Z992 Dependence on renal dialysis: Secondary | ICD-10-CM

## 2016-08-04 DIAGNOSIS — K2970 Gastritis, unspecified, without bleeding: Secondary | ICD-10-CM | POA: Diagnosis present

## 2016-08-04 DIAGNOSIS — N186 End stage renal disease: Secondary | ICD-10-CM | POA: Diagnosis not present

## 2016-08-04 DIAGNOSIS — I120 Hypertensive chronic kidney disease with stage 5 chronic kidney disease or end stage renal disease: Secondary | ICD-10-CM | POA: Diagnosis not present

## 2016-08-04 DIAGNOSIS — R0602 Shortness of breath: Secondary | ICD-10-CM | POA: Diagnosis not present

## 2016-08-04 DIAGNOSIS — Z419 Encounter for procedure for purposes other than remedying health state, unspecified: Secondary | ICD-10-CM

## 2016-08-04 DIAGNOSIS — Z720 Tobacco use: Secondary | ICD-10-CM | POA: Diagnosis present

## 2016-08-04 DIAGNOSIS — K644 Residual hemorrhoidal skin tags: Secondary | ICD-10-CM | POA: Diagnosis not present

## 2016-08-04 DIAGNOSIS — K921 Melena: Secondary | ICD-10-CM | POA: Diagnosis present

## 2016-08-04 HISTORY — DX: Alcohol abuse, uncomplicated: F10.10

## 2016-08-04 HISTORY — DX: Chronic kidney disease, unspecified: N18.9

## 2016-08-04 LAB — CBC WITH DIFFERENTIAL/PLATELET
Basophils Absolute: 0 10*3/uL (ref 0.0–0.1)
Basophils Relative: 0 %
Eosinophils Absolute: 0.7 10*3/uL (ref 0.0–0.7)
Eosinophils Relative: 9 %
HCT: 16.2 % — ABNORMAL LOW (ref 39.0–52.0)
Hemoglobin: 5.3 g/dL — CL (ref 13.0–17.0)
Lymphocytes Relative: 15 %
Lymphs Abs: 1.2 10*3/uL (ref 0.7–4.0)
MCH: 31.5 pg (ref 26.0–34.0)
MCHC: 32.7 g/dL (ref 30.0–36.0)
MCV: 96.4 fL (ref 78.0–100.0)
Monocytes Absolute: 0.5 10*3/uL (ref 0.1–1.0)
Monocytes Relative: 6 %
Neutro Abs: 5.6 10*3/uL (ref 1.7–7.7)
Neutrophils Relative %: 70 %
Platelets: 158 10*3/uL (ref 150–400)
RBC: 1.68 MIL/uL — ABNORMAL LOW (ref 4.22–5.81)
RDW: 14.2 % (ref 11.5–15.5)
WBC: 8 10*3/uL (ref 4.0–10.5)

## 2016-08-04 LAB — IRON AND TIBC
Iron: 29 ug/dL — ABNORMAL LOW (ref 45–182)
Saturation Ratios: 7 % — ABNORMAL LOW (ref 17.9–39.5)
TIBC: 423 ug/dL (ref 250–450)
UIBC: 394 ug/dL

## 2016-08-04 LAB — HEPATIC FUNCTION PANEL
ALT: 20 U/L (ref 17–63)
AST: 22 U/L (ref 15–41)
Albumin: 3.2 g/dL — ABNORMAL LOW (ref 3.5–5.0)
Alkaline Phosphatase: 78 U/L (ref 38–126)
Bilirubin, Direct: <0.1 mg/dL — ABNORMAL LOW (ref 0.1–0.5)
Total Bilirubin: 0.4 mg/dL (ref 0.3–1.2)
Total Protein: 6.4 g/dL — ABNORMAL LOW (ref 6.5–8.1)

## 2016-08-04 LAB — I-STAT TROPONIN, ED: Troponin i, poc: 0.03 ng/mL (ref 0.00–0.08)

## 2016-08-04 LAB — I-STAT CHEM 8, ED
BUN: 88 mg/dL — ABNORMAL HIGH (ref 6–20)
Calcium, Ion: 1.15 mmol/L (ref 1.15–1.40)
Chloride: 113 mmol/L — ABNORMAL HIGH (ref 101–111)
Creatinine, Ser: 14.4 mg/dL — ABNORMAL HIGH (ref 0.61–1.24)
Glucose, Bld: 92 mg/dL (ref 65–99)
HCT: 15 % — ABNORMAL LOW (ref 39.0–52.0)
Hemoglobin: 5.1 g/dL — CL (ref 13.0–17.0)
Potassium: 4.6 mmol/L (ref 3.5–5.1)
Sodium: 140 mmol/L (ref 135–145)
TCO2: 16 mmol/L (ref 0–100)

## 2016-08-04 LAB — RETICULOCYTES
RBC.: 1.68 MIL/uL — ABNORMAL LOW (ref 4.22–5.81)
Retic Count, Absolute: 38.6 10*3/uL (ref 19.0–186.0)
Retic Ct Pct: 2.3 % (ref 0.4–3.1)

## 2016-08-04 LAB — ETHANOL: Alcohol, Ethyl (B): <5 mg/dL (ref <5)

## 2016-08-04 LAB — ABO/RH: ABO/RH(D): O NEG

## 2016-08-04 LAB — URINE MICROSCOPIC-ADD ON

## 2016-08-04 LAB — HEMOGLOBIN AND HEMATOCRIT, BLOOD
HCT: 24.9 % — ABNORMAL LOW (ref 39.0–52.0)
Hemoglobin: 8.3 g/dL — ABNORMAL LOW (ref 13.0–17.0)

## 2016-08-04 LAB — URINALYSIS, ROUTINE W REFLEX MICROSCOPIC
Bilirubin Urine: NEGATIVE
Glucose, UA: 100 mg/dL — AB
Ketones, ur: NEGATIVE mg/dL
Leukocytes, UA: NEGATIVE
Nitrite: NEGATIVE
Protein, ur: >300 mg/dL — AB
Specific Gravity, Urine: 1.011 (ref 1.005–1.030)
pH: 5.5 (ref 5.0–8.0)

## 2016-08-04 LAB — PREPARE RBC (CROSSMATCH)

## 2016-08-04 LAB — FOLATE: Folate: 10.6 ng/mL (ref >5.9)

## 2016-08-04 LAB — ACETAMINOPHEN LEVEL: Acetaminophen (Tylenol), Serum: <10 ug/mL — ABNORMAL LOW (ref 10–30)

## 2016-08-04 LAB — SALICYLATE LEVEL: Salicylate Lvl: <7 mg/dL (ref 2.8–30.0)

## 2016-08-04 LAB — PROTEIN / CREATININE RATIO, URINE
Creatinine, Urine: 68.1 mg/dL
Protein Creatinine Ratio: 2.67 mg/mg{Cre} — ABNORMAL HIGH (ref 0.00–0.15)
Total Protein, Urine: 182 mg/dL

## 2016-08-04 LAB — VITAMIN B12: Vitamin B-12: 220 pg/mL (ref 180–914)

## 2016-08-04 LAB — POC OCCULT BLOOD, ED: Fecal Occult Bld: POSITIVE — AB

## 2016-08-04 LAB — FERRITIN: Ferritin: 16 ng/mL — ABNORMAL LOW (ref 24–336)

## 2016-08-04 LAB — TSH: TSH: 1.742 u[IU]/mL (ref 0.350–4.500)

## 2016-08-04 IMAGING — CR DG CHEST 2V
2 series · 2 of 2 positions shown · non-contrast
Comparison: None.

CLINICAL DATA: 49 y/o M; shortness of breath and mid chest pain for
1 week.

EXAM:
CHEST  2 VIEW

[chest pa]
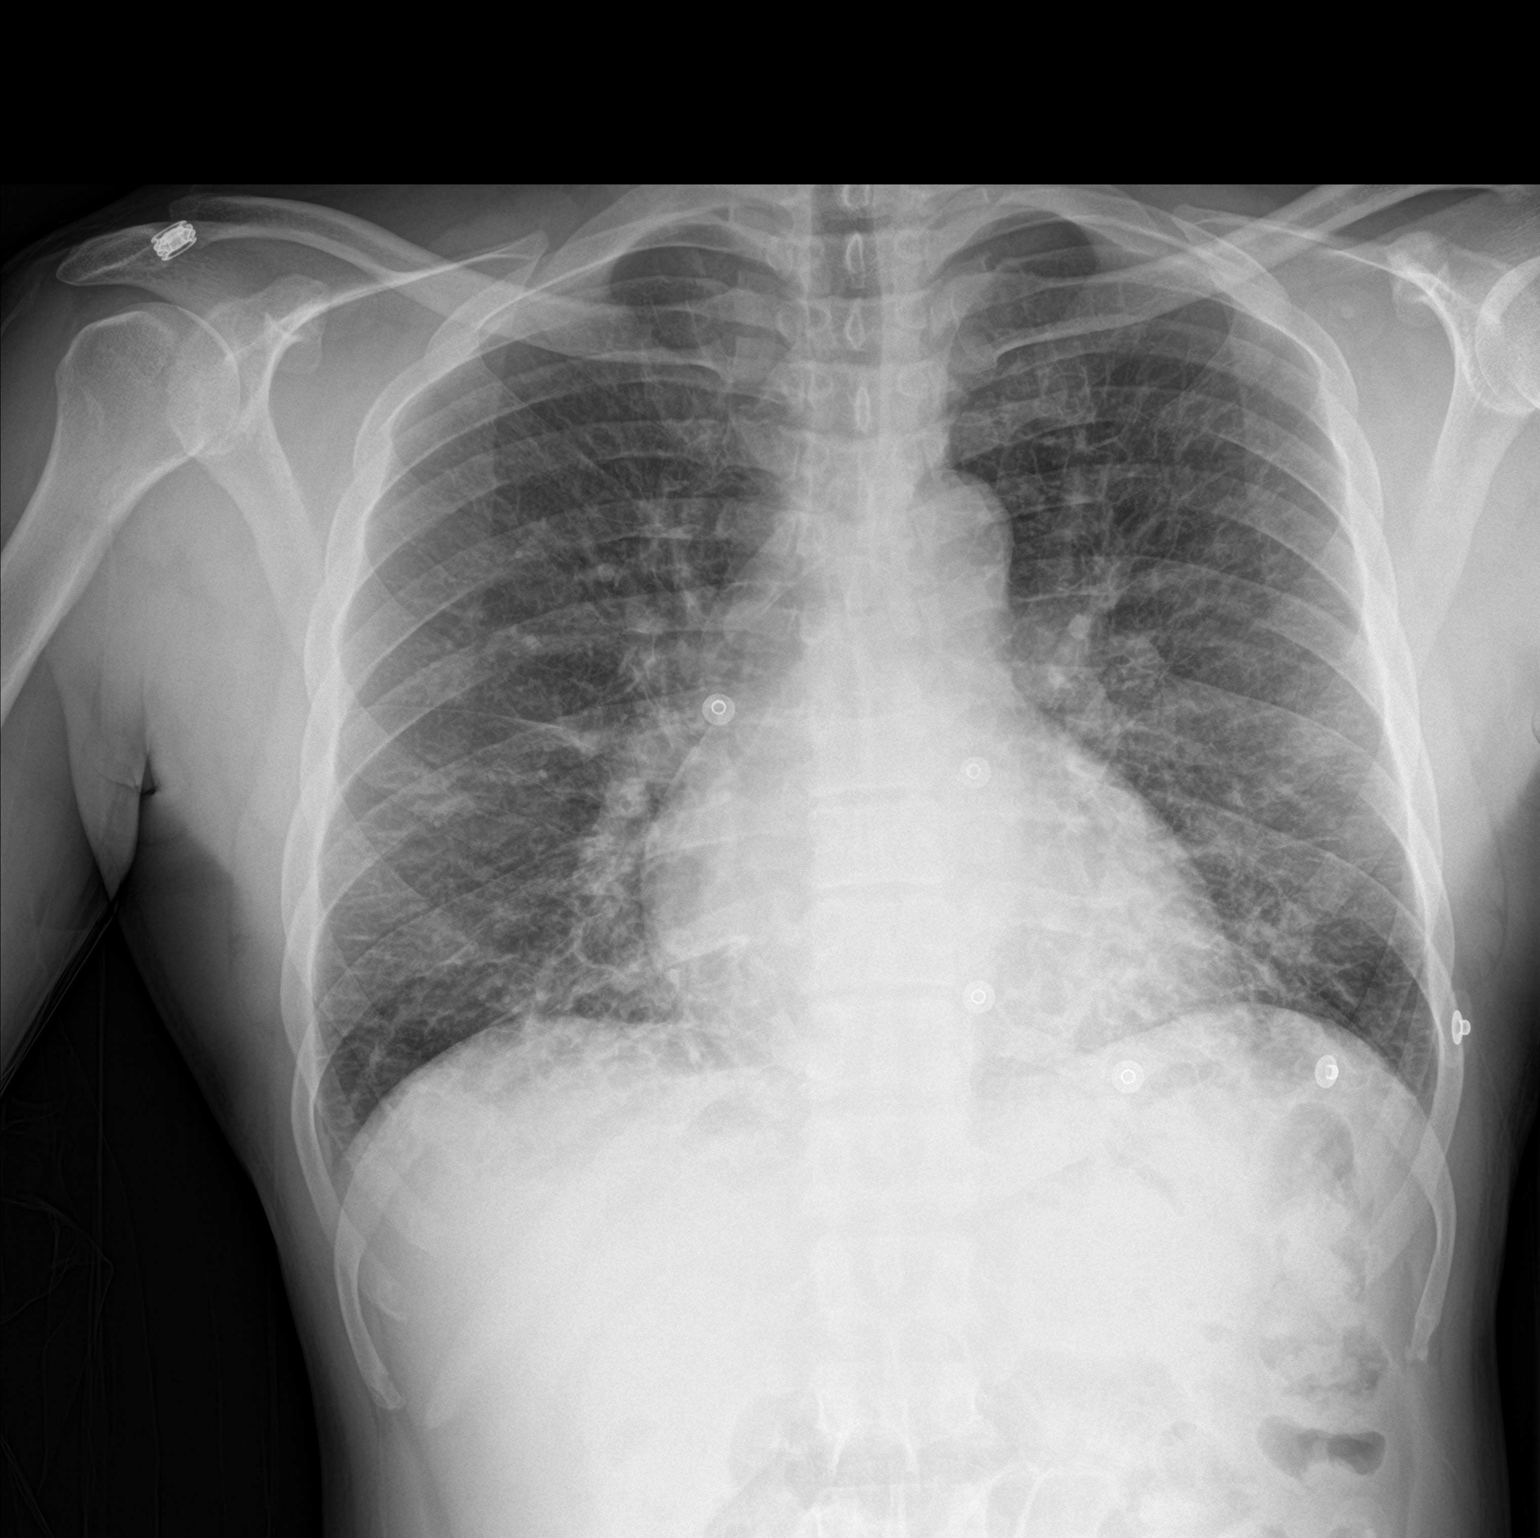

[chest lat]
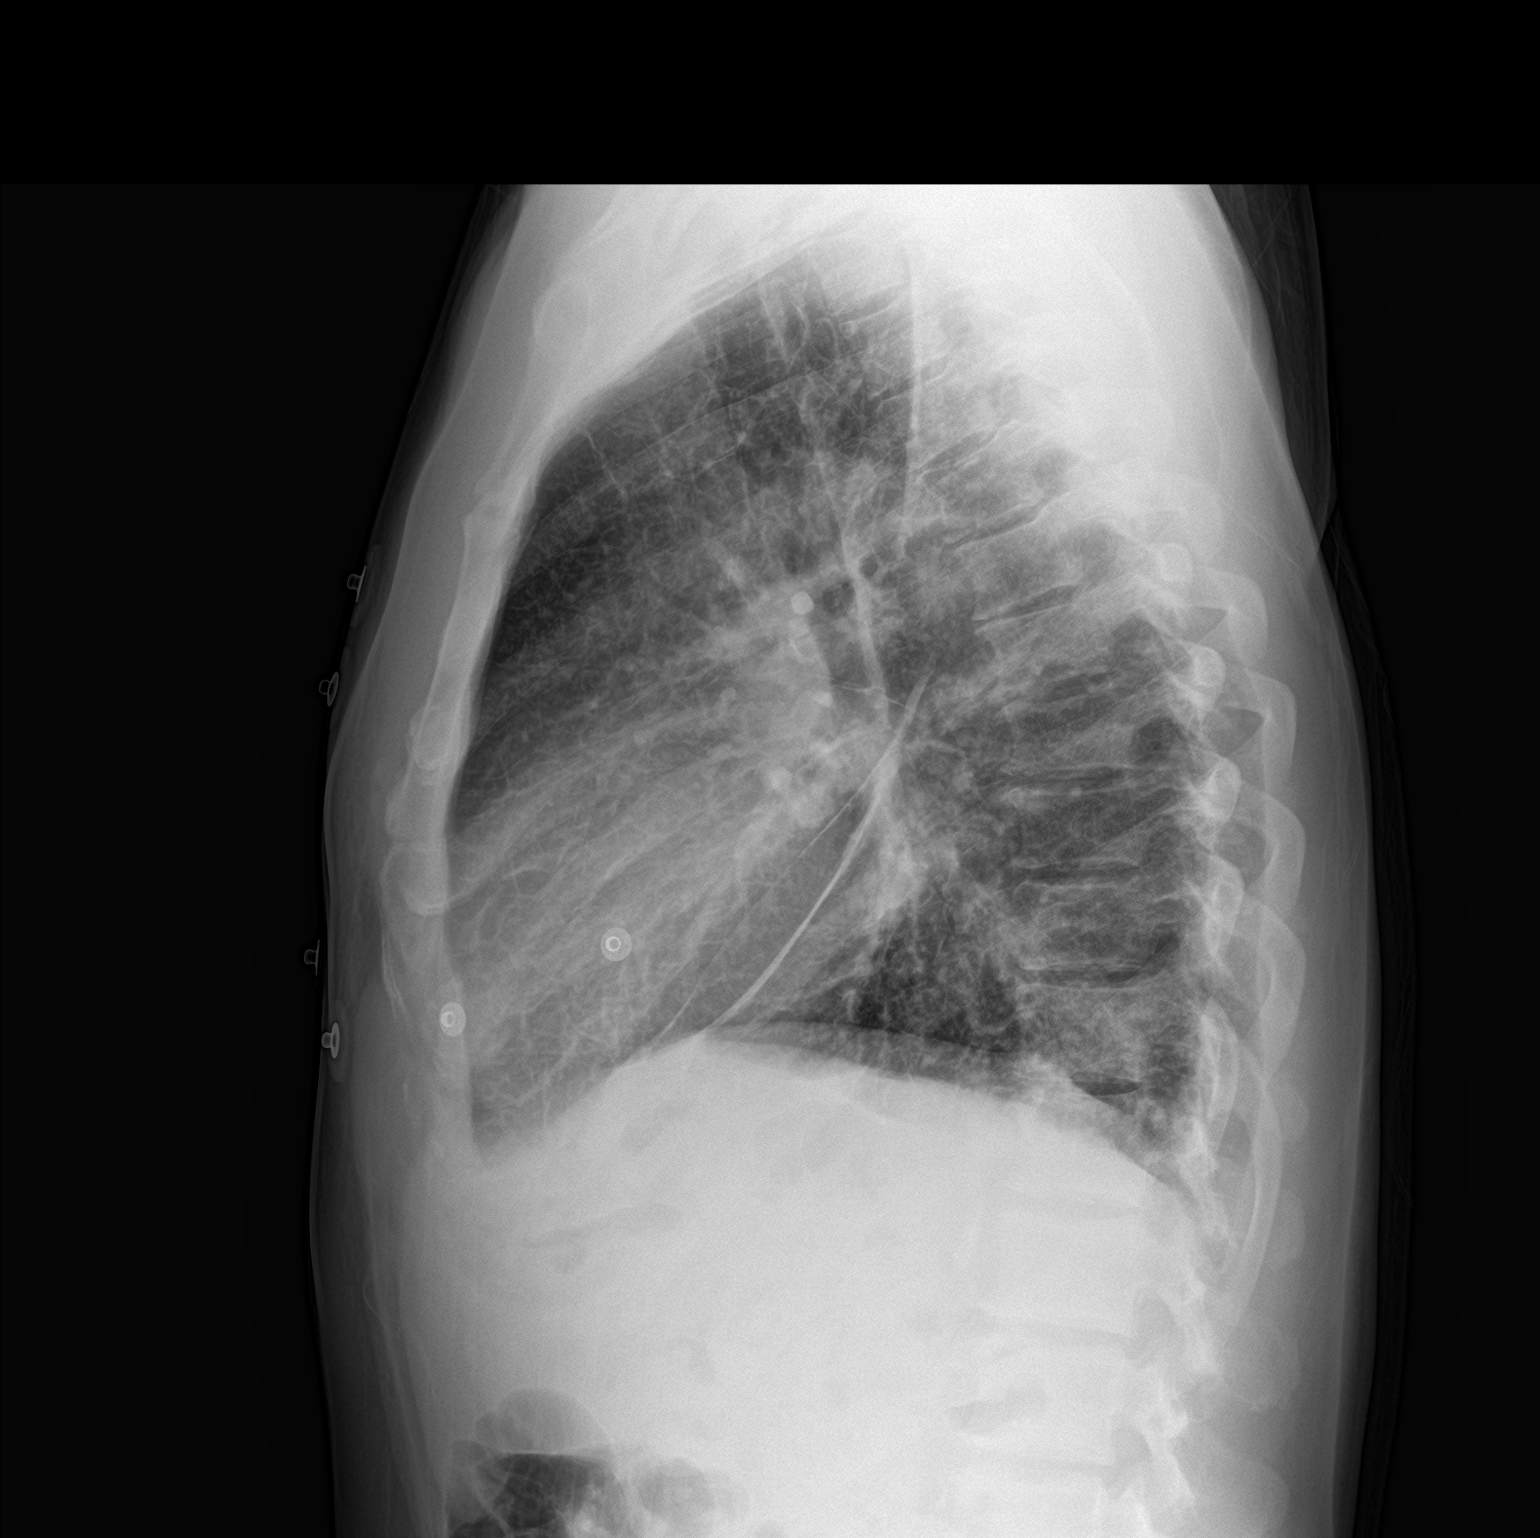

[2 of 2 positions shown; findings below may reference images not displayed]

FINDINGS: Moderate bronchitic changes. No focal consolidation. No pneumothorax
or effusion. Normal cardiac silhouette. Mild degenerative changes of
thoracic spine.
IMPRESSION: Moderate bronchitic changes.  No focal consolidation.

By: RTOYOTA M.D.

## 2016-08-04 MED ORDER — ONDANSETRON HCL 4 MG PO TABS: 4.0000 mg | ORAL_TABLET | Freq: Four times a day (QID) | ORAL | Status: DC | PRN

## 2016-08-04 MED ORDER — THIAMINE HCL 100 MG/ML IJ SOLN
100.0000 mg | Freq: Every day | INTRAMUSCULAR | Status: DC
  Filled 2016-08-04: qty 2

## 2016-08-04 MED ORDER — VITAMIN B-1 100 MG PO TABS
100.0000 mg | ORAL_TABLET | Freq: Every day | ORAL | Status: DC
  Administered 2016-08-04 – 2016-08-12 (×9): 100 mg via ORAL
  Filled 2016-08-04 (×9): qty 1

## 2016-08-04 MED ORDER — FOLIC ACID 1 MG PO TABS
1.0000 mg | ORAL_TABLET | Freq: Every day | ORAL | Status: DC
  Administered 2016-08-04 – 2016-08-12 (×9): 1 mg via ORAL
  Filled 2016-08-04 (×9): qty 1

## 2016-08-04 MED ORDER — LORAZEPAM 1 MG PO TABS: 1.0000 mg | ORAL_TABLET | Freq: Four times a day (QID) | ORAL | Status: AC | PRN

## 2016-08-04 MED ORDER — BISACODYL 10 MG RE SUPP: 10.0000 mg | Freq: Every day | RECTAL | Status: DC | PRN

## 2016-08-04 MED ORDER — ACETAMINOPHEN 650 MG RE SUPP: 650.0000 mg | Freq: Four times a day (QID) | RECTAL | Status: DC | PRN

## 2016-08-04 MED ORDER — SODIUM CHLORIDE 0.9 % IV SOLN: 10.0000 mL/h | Freq: Once | INTRAVENOUS | Status: DC

## 2016-08-04 MED ORDER — HYDRALAZINE HCL 20 MG/ML IJ SOLN
10.0000 mg | Freq: Once | INTRAMUSCULAR | Status: AC
  Administered 2016-08-04: 10 mg via INTRAVENOUS
  Filled 2016-08-04: qty 1

## 2016-08-04 MED ORDER — HYDRALAZINE HCL 20 MG/ML IJ SOLN
10.0000 mg | Freq: Four times a day (QID) | INTRAMUSCULAR | Status: DC | PRN
  Administered 2016-08-04 – 2016-08-05 (×2): 10 mg via INTRAVENOUS
  Filled 2016-08-04 (×2): qty 1

## 2016-08-04 MED ORDER — ACETAMINOPHEN 325 MG PO TABS
650.0000 mg | ORAL_TABLET | Freq: Four times a day (QID) | ORAL | Status: DC | PRN
  Administered 2016-08-05 – 2016-08-10 (×4): 650 mg via ORAL
  Filled 2016-08-04 (×2): qty 2

## 2016-08-04 MED ORDER — ONDANSETRON HCL 4 MG/2ML IJ SOLN: 4.0000 mg | Freq: Four times a day (QID) | INTRAMUSCULAR | Status: DC | PRN

## 2016-08-04 MED ORDER — ADULT MULTIVITAMIN W/MINERALS CH
1.0000 | ORAL_TABLET | Freq: Every day | ORAL | Status: DC
  Administered 2016-08-04 – 2016-08-12 (×10): 1 via ORAL
  Filled 2016-08-04 (×11): qty 1

## 2016-08-04 MED ORDER — MAGNESIUM CITRATE PO SOLN: 1.0000 | Freq: Once | ORAL | Status: DC | PRN

## 2016-08-04 MED ORDER — ALBUTEROL SULFATE (2.5 MG/3ML) 0.083% IN NEBU
2.5000 mg | INHALATION_SOLUTION | RESPIRATORY_TRACT | Status: DC | PRN
Start: 2016-08-04 — End: 2016-08-12
  Administered 2016-08-04 (×2): 2.5 mg via RESPIRATORY_TRACT
  Filled 2016-08-04 (×2): qty 3

## 2016-08-04 MED ORDER — HYDRALAZINE HCL 20 MG/ML IJ SOLN: 5.0000 mg | Freq: Three times a day (TID) | INTRAMUSCULAR | Status: DC | PRN

## 2016-08-04 MED ORDER — SODIUM CHLORIDE 0.9 % IV SOLN
INTRAVENOUS | Status: DC
  Administered 2016-08-04 – 2016-08-07 (×3): via INTRAVENOUS

## 2016-08-04 MED ORDER — SODIUM CHLORIDE 0.9% FLUSH
3.0000 mL | Freq: Two times a day (BID) | INTRAVENOUS | Status: DC
  Administered 2016-08-04 – 2016-08-11 (×10): 3 mL via INTRAVENOUS

## 2016-08-04 MED ORDER — TRAZODONE HCL 50 MG PO TABS
25.0000 mg | ORAL_TABLET | Freq: Every evening | ORAL | Status: DC | PRN
  Filled 2016-08-04: qty 1

## 2016-08-04 MED ORDER — POLYETHYLENE GLYCOL 3350 17 G PO PACK
17.0000 g | PACK | Freq: Every day | ORAL | Status: DC
  Administered 2016-08-04 – 2016-08-05 (×2): 17 g via ORAL
  Filled 2016-08-04 (×2): qty 1

## 2016-08-04 MED ORDER — SENNOSIDES-DOCUSATE SODIUM 8.6-50 MG PO TABS
1.0000 | ORAL_TABLET | Freq: Every evening | ORAL | Status: DC | PRN
  Administered 2016-08-09: 1 via ORAL
  Filled 2016-08-04 (×2): qty 1

## 2016-08-04 MED ORDER — NICOTINE 14 MG/24HR TD PT24
14.0000 mg | MEDICATED_PATCH | Freq: Every day | TRANSDERMAL | Status: DC
  Administered 2016-08-04 – 2016-08-12 (×8): 14 mg via TRANSDERMAL
  Filled 2016-08-04 (×9): qty 1

## 2016-08-04 MED ORDER — SODIUM CHLORIDE 0.9 % IV BOLUS (SEPSIS)
1000.0000 mL | Freq: Once | INTRAVENOUS | Status: AC
  Administered 2016-08-04: 1000 mL via INTRAVENOUS

## 2016-08-04 MED ORDER — IPRATROPIUM-ALBUTEROL 0.5-2.5 (3) MG/3ML IN SOLN
3.0000 mL | Freq: Once | RESPIRATORY_TRACT | Status: AC
  Administered 2016-08-04: 3 mL via RESPIRATORY_TRACT
  Filled 2016-08-04: qty 3

## 2016-08-04 MED ORDER — SALINE SPRAY 0.65 % NA SOLN
1.0000 | NASAL | Status: DC | PRN
  Administered 2016-08-05: 1 via NASAL
  Filled 2016-08-04 (×2): qty 44

## 2016-08-04 MED ORDER — FERUMOXYTOL INJECTION 510 MG/17 ML
510.0000 mg | Freq: Once | INTRAVENOUS | Status: AC
  Administered 2016-08-04: 510 mg via INTRAVENOUS
  Filled 2016-08-04: qty 17

## 2016-08-04 MED ORDER — LORAZEPAM 2 MG/ML IJ SOLN: 1.0000 mg | Freq: Four times a day (QID) | INTRAMUSCULAR | Status: AC | PRN

## 2016-08-04 NOTE — Progress Notes (Signed)
GI Prelim Note:  Chart reviewed, will be seeing pt later today.  Cleotis Nipper, M.D. Pager [phone] If no answer or after 5 PM call [phone]

## 2016-08-04 NOTE — H&P (Signed)
History and Physical    Jordan [name] MRN:[id number] DOB: 03/12/1966 DOA: 08/04/2016   PCP: No PCP Per Patient   Patient coming from:  Home  Chief Complaint: SHortness of breath   HPI: Jordan [name] is a 49 y.o. male not seen by a PCP in several years, with a history of tobacco abuse, presenting to the ED with several day history of progressive shortness of breath with wheezing waking him out from his sleep. Dyspnea is worse with exertion, cannot walk more than one block before experiencing these symptoms. He denies any hemoptysis.  Denies rhinorrhea, fevers, chills, night sweats or mucositis.  Denies any chest pain, chest wall pain or palpitations.Denies any sick contacts or recent long distant travels. He works as a painter. Denies any abdominal pain, nausea vomiting or diarrhea. He denies any gross blood in the stools. He denies any decreased appetite Denies dizziness or vertigo. Denies lower extremity swelling. No confusion was reported. Denies any vision changes, double vision or headaches. Has not had any screening tests over the last several years.   . ED Course:  BP (!) 185/102   Pulse (!) 126   Temp 98.5 F (36.9 C) (Oral)   Resp 24   Ht 5' 4" (1.626 m)   Wt 74.8 kg (165 lb)   SpO2 100%   BMI 28.32 kg/m    CXR showing moderate bronchitic changes. Patient was given 1 L of fluids, DuoNeb x2 Hemoglobin 5.1 receiving 2 units of blood BUN 88 creatinine 14.4, previously 2.81 in May 2014 urinalysis positive for yeast Hemoccult positive Nephrology consult and GI consult are  Pending.  Review of Systems: As per HPI otherwise 10 point review of systems negative.   History reviewed. No pertinent past medical history.  History reviewed. No pertinent surgical history.  Social History Social History   Social History  . Marital status: Single    Spouse name: N/A  . Number of children: N/A  . Years of education: N/A   Occupational History  . Not on file.   Social  History Main Topics  . Smoking status: Current Every Day Smoker    Packs/day: 0.50    Years: 15.00    Types: Cigarettes  . Smokeless tobacco: Never Used  . Alcohol use 12.6 oz/week    21 Cans of beer per week  . Drug use: Unknown  . Sexual activity: Not on file   Other Topics Concern  . Not on file   Social History Narrative  . No narrative on file     No Known Allergies  Family History  Problem Relation Age of Onset  . Diabetes Mother   . Kidney failure Father   . Kidney failure Brother       Prior to Admission medications   Not on File    Physical Exam:    Vitals:   08/04/16 0530 08/04/16 0600 08/04/16 0700 08/04/16 0730  BP: 173/100 (!) 195/108 (!) 193/109 (!) 185/102  Pulse: 108 115 118 (!) 126  Resp: (!) 28 23 18 24  Temp:      TempSrc:      SpO2: 98% 98% 95% 100%  Weight:      Height:           Constitutional: NAD, calm, comfortable  Vitals:   08/04/16 0530 08/04/16 0600 08/04/16 0700 08/04/16 0730  BP: 173/100 (!) 195/108 (!) 193/109 (!) 185/102  Pulse: 108 115 118 (!) 126  Resp: (!) 28 23 18 24  Temp:        TempSrc:      SpO2: 98% 98% 95% 100%  Weight:      Height:       Eyes: PERRL,periorbital edema, conjunctivae normal ENMT: Mucous membranes are moist. Posterior pharynx clear of any exudate or lesions.Many missing teeth.  Neck: normal, supple, no masses, no thyromegaly Respiratory: clear to auscultation bilaterally, no wheezing, no crackles. Normal respiratory effort. No accessory muscle use.  Cardiovascular: Regular rate and rhythm, no murmurs / rubs / gallops. No extremity edema. 2+ pedal pulses. No carotid bruits.  Abdomen: no tenderness, no masses palpated. No hepatosplenomegaly. Bowel sounds positive.  Musculoskeletal: no clubbing / cyanosis. No joint deformity upper and lower extremities. Good ROM, no contractures. Normal muscle tone.  Skin: no rashes, lesions, ulcers.  Neurologic: CN 2-12 grossly intact. Sensation intact, DTR  normal. Strength 5/5 in all 4.  Psychiatric: Normal judgment and insight. Alert and oriented x 3. Normal mood.     Labs on Admission: I have personally reviewed following labs and imaging studies  CBC:  Recent Labs Lab 08/04/16 0253 08/04/16 0305  WBC 8.0  --   NEUTROABS 5.6  --   HGB 5.3* 5.1*  HCT 16.2* 15.0*  MCV 96.4  --   PLT 158  --     Basic Metabolic Panel:  Recent Labs Lab 08/04/16 0305  NA 140  K 4.6  CL 113*  GLUCOSE 92  BUN 88*  CREATININE 14.40*    GFR: Estimated Creatinine Clearance: 5.8 mL/min (by C-G formula based on SCr of 14.4 mg/dL (H)).  Liver Function Tests:  Recent Labs Lab 08/04/16 0253  AST 22  ALT 20  ALKPHOS 78  BILITOT 0.4  PROT 6.4*  ALBUMIN 3.2*   No results for input(s): LIPASE, AMYLASE in the last 168 hours. No results for input(s): AMMONIA in the last 168 hours.  Coagulation Profile: No results for input(s): INR, PROTIME in the last 168 hours.  Cardiac Enzymes: No results for input(s): CKTOTAL, CKMB, CKMBINDEX, TROPONINI in the last 168 hours.  BNP (last 3 results) No results for input(s): PROBNP in the last 8760 hours.  HbA1C: No results for input(s): HGBA1C in the last 72 hours.  CBG: No results for input(s): GLUCAP in the last 168 hours.  Lipid Profile: No results for input(s): CHOL, HDL, LDLCALC, TRIG, CHOLHDL, LDLDIRECT in the last 72 hours.  Thyroid Function Tests: No results for input(s): TSH, T4TOTAL, FREET4, T3FREE, THYROIDAB in the last 72 hours.  Anemia Panel:  Recent Labs  08/04/16 0543  VITAMINB12 220  FOLATE 10.6  FERRITIN 16*  TIBC 423  IRON 29*  RETICCTPCT 2.3    Urine analysis:    Component Value Date/Time   COLORURINE STRAW (A) 08/04/2016 0448   APPEARANCEUR CLOUDY (A) 08/04/2016 0448   LABSPEC 1.011 08/04/2016 0448   PHURINE 5.5 08/04/2016 0448   GLUCOSEU 100 (A) 08/04/2016 0448   HGBUR SMALL (A) 08/04/2016 0448   BILIRUBINUR NEGATIVE 08/04/2016 0448   KETONESUR NEGATIVE  08/04/2016 0448   PROTEINUR >300 (A) 08/04/2016 0448   NITRITE NEGATIVE 08/04/2016 0448   LEUKOCYTESUR NEGATIVE 08/04/2016 0448    Sepsis Labs: @LABRCNTIP(procalcitonin:4,lacticidven:4) )No results found for this or any previous visit (from the past 240 hour(s)).   Radiological Exams on Admission: Dg Chest 2 View  Result Date: 08/04/2016 CLINICAL DATA:  49 y/o M; shortness of breath and mid chest pain for 1 week. EXAM: CHEST  2 VIEW COMPARISON:  None. FINDINGS: Moderate bronchitic changes. No focal consolidation. No pneumothorax or effusion. Normal   cardiac silhouette. Mild degenerative changes of thoracic spine. IMPRESSION: Moderate bronchitic changes.  No focal consolidation. Electronically Signed   By: Lance  Furusawa-Stratton M.D.   On: 08/04/2016 04:10    EKG: Independently reviewed.  Assessment/Plan Active Problems:   Tobacco abuse   ETOH abuse   Uncontrolled hypertension   Acute renal failure (ARF) (HCC)   Shortness of breath    Symptomatic Anemia in the setting of blood loss,  chronic disease, iron deficiency    Hemoglobin on admission 5.1 (15 in 2014)  MCV normal at 93.4, retic at 2.3, ferritin 16, Iron 29 . B12 220  Received  2 units of blood.  Hcult +. Never had a colonoscopy. CXR w/o evidence of acute process.O2 sats normal in room air   No significant amount of NSAIDs.  Admit to inpatient telemetry  Repeat CBC in am. Transfuse if Hb less than 8  Iron supplements GI consult : spoke with Dr. Buccini, who is to see patient today. Clear liquid diet today,  NPO after midnight for endoscopy  Check SPEP, UPEP   Acute on  likely chronic renal failure. Last Cr in 2014 2.81, currently at 14.4. No EGFR available for review his current stage  Lab Results  Component Value Date   CREATININE 14.40 (H) 08/04/2016   CREATININE 2.81 (H) 02/01/2013  Nephrology to see Obtain Renal US SPEP and UPEP   Uncontrolled  Hypertension in the setting of renal failure currently between  200s/100s . Has not had OP follow up  TSH Hydralazine 5-10 mg IV q 6 hrs prn for BP 180/90  Will need  workup for secondary hypertension as outpatient  Alcohol abuse and dependence and at risk for withdrawal -  Telemetry Check ETOH level  -  CIWA with Ativan per protocol -  Thiamine, folate, and MVI  Tobacco abuse  -  Nicotine patch   -  Counseled cessation    DVT prophylaxis: SCD's  Code Status:   Full     Family Communication:  Discussed with patient Disposition Plan: Expect patient to be discharged to home after condition improves Consults called:    Nephrology and GI  Admission status Inpatient Tele  , E, PA-C [facility]   08/04/2016, 7:50 AM    

## 2016-08-04 NOTE — ED Notes (Signed)
MD merrell at bedside. States pt can have clear liquids at this time.

## 2016-08-04 NOTE — ED Notes (Signed)
Blood bank called and states blood is ready 

## 2016-08-04 NOTE — ED Provider Notes (Signed)
[facility] DEPT Provider Note   CSN: [id number] Arrival date & time: 08/04/16  0229  By signing my name below, I, [name]. [name], attest that this documentation has been prepared under the direction and in the presence of Teghan Philbin, MD.  Electronically Signed: [name]. [name], ED Scribe. 08/04/16. 2:58 AM.   History   Chief Complaint Chief Complaint  Patient presents with  . Shortness of Breath   The history is provided by the patient. No language interpreter was used.  Shortness of Breath  This is a new problem. The problem occurs continuously.The current episode started 2 days ago. The problem has been gradually worsening. Associated symptoms include cough and wheezing. Pertinent negatives include no fever, no headaches, no vomiting and no leg swelling. It is unknown what precipitated the problem. Risk factors include smoking. He has had no prior hospitalizations. He has had no prior ED visits. He has had no prior ICU admissions.    HPI Comments: Jordan [name], brought in by EMS from home is a 50 y.o. male without any pertinent past medical history who presents to the Emergency Department complaining of constant shortness of breath x 2 days; worsened this evening that woke him from sleep. Pt states shortness of breath is made worse with exertion without any alleviating factors. Pt states he is able to walk about 1 city block before shortness of breath worsens. He also reports associated dry cough and wheezing. CPAP applied and pt was given Nitro x 3 and albuterol en route to department. Pt also attempted 324 mg of ASA prior to EMS arrival. No recent fever or chills. Pt smokes approximately 1/2 pack of cigarettes daily. He also drinks 12 ounces on average daily but denies any illicit drug use.  PCP: No PCP Per Patient    History reviewed. No pertinent past medical history.  There are no active problems to display for this patient.   History reviewed. No pertinent surgical  history.     Home Medications    Prior to Admission medications   Medication Sig Start Date End Date Taking? Authorizing Provider  oxyCODONE-acetaminophen (PERCOCET/ROXICET) 5-325 MG per tablet Take 2 tablets by mouth every 4 (four) hours as needed for pain. 02/01/13   Linton Flemings, MD    Family History No family history on file.  Social History Social History  Substance Use Topics  . Smoking status: Current Every Day Smoker  . Smokeless tobacco: Never Used  . Alcohol use Yes     Allergies   Patient has no known allergies.   Review of Systems Review of Systems  Constitutional: Negative for chills and fever.  Respiratory: Positive for cough, shortness of breath and wheezing.   Cardiovascular: Negative for leg swelling.  Gastrointestinal: Negative for nausea and vomiting.  Neurological: Negative for headaches.  All other systems reviewed and are negative.    Physical Exam Updated Vital Signs BP (!) 125/107 (BP Location: Right Arm)   Pulse 113   Temp 98.5 F (36.9 C) (Oral)   Resp 20   Ht 5\' 4"  (1.626 m)   Wt 165 lb (74.8 kg)   SpO2 100%   BMI 28.32 kg/m   Physical Exam  Constitutional: He is oriented to person, place, and time. He appears well-developed and well-nourished. No distress.  HENT:  Head: Normocephalic and atraumatic.  Mouth/Throat: Oropharynx is clear and moist. No oropharyngeal exudate.  Eyes: EOM are normal. Pupils are equal, round, and reactive to light.  Dense periorbital edema B lower>>>  upper  Neck: Normal range of motion. Neck supple. No JVD present. No tracheal deviation present.  No carotid bruits. Trachea is midline.  Cardiovascular: Regular rhythm, normal heart sounds and intact distal pulses.  Tachycardia present.   Pulmonary/Chest: Effort normal. No stridor. No respiratory distress. He has wheezes. He has no rales.  Wheezes of the L and diminished on the R.  Abdominal: Soft. Bowel sounds are normal. He exhibits no distension and no  mass. There is no tenderness. There is no guarding.  Genitourinary: Rectal exam shows guaiac positive stool.  Genitourinary Comments: Chaperone present normal colored hard stool in the rectal vault  Musculoskeletal: Normal range of motion. He exhibits no edema or deformity.  Lymphadenopathy:    He has no cervical adenopathy.  Neurological: He is alert and oriented to person, place, and time.  Skin: Skin is warm and dry. Capillary refill takes less than 2 seconds.  Psychiatric: He has a normal mood and affect. Judgment normal.  Nursing note and vitals reviewed.    ED Treatments / Results   DIAGNOSTIC STUDIES: Oxygen Saturation is 100% on RA, Normal by my interpretation.    COORDINATION OF CARE: 2:58 AM- Will order blood work and EKG. Discussed treatment plan with pt at bedside and pt agreed to plan.     Vitals:   08/04/16 0330 08/04/16 0430  BP: (!) 171/109 (!) 167/113  Pulse: 101 108  Resp: 18 24  Temp:      EKG Interpretation  Date/Time:  Monday August 04 2016 02:30:59 EST Ventricular Rate:  118 PR Interval:    QRS Duration: 82 QT Interval:  342 QTC Calculation: 480 R Axis:   69 Text Interpretation:  Sinus tachycardia Left ventricular hypertrophy Confirmed by [facility]  MD, Emmaline Kluver ([id number]) on 08/04/2016 3:00:23 AM      Results for orders placed or performed during the hospital encounter of 08/04/16  CBC with Differential/Platelet  Result Value Ref Range   WBC 8.0 4.0 - 10.5 K/uL   RBC 1.68 (L) 4.22 - 5.81 MIL/uL   Hemoglobin 5.3 (LL) 13.0 - 17.0 g/dL   HCT 16.2 (L) 39.0 - 52.0 %   MCV 96.4 78.0 - 100.0 fL   MCH 31.5 26.0 - 34.0 pg   MCHC 32.7 30.0 - 36.0 g/dL   RDW 14.2 11.5 - 15.5 %   Platelets 158 150 - 400 K/uL   Neutrophils Relative % 70 %   Lymphocytes Relative 15 %   Monocytes Relative 6 %   Eosinophils Relative 9 %   Basophils Relative 0 %   Neutro Abs 5.6 1.7 - 7.7 K/uL   Lymphs Abs 1.2 0.7 - 4.0 K/uL   Monocytes Absolute 0.5 0.1 - 1.0 K/uL    Eosinophils Absolute 0.7 0.0 - 0.7 K/uL   Basophils Absolute 0.0 0.0 - 0.1 K/uL   RBC Morphology POLYCHROMASIA PRESENT   Hepatic function panel  Result Value Ref Range   Total Protein 6.4 (L) 6.5 - 8.1 g/dL   Albumin 3.2 (L) 3.5 - 5.0 g/dL   AST 22 15 - 41 U/L   ALT 20 17 - 63 U/L   Alkaline Phosphatase 78 38 - 126 U/L   Total Bilirubin 0.4 0.3 - 1.2 mg/dL   Bilirubin, Direct <0.1 (L) 0.1 - 0.5 mg/dL   Indirect Bilirubin NOT CALCULATED 0.3 - 0.9 mg/dL  Acetaminophen level  Result Value Ref Range   Acetaminophen (Tylenol), Serum <10 (L) 10 - 30 ug/mL  Salicylate level  Result Value Ref Range  Salicylate Lvl <2.8 2.8 - 30.0 mg/dL  Urinalysis, Routine w reflex microscopic (not at [facility])  Result Value Ref Range   Color, Urine STRAW (A) YELLOW   APPearance CLOUDY (A) CLEAR   Specific Gravity, Urine 1.011 1.005 - 1.030   pH 5.5 5.0 - 8.0   Glucose, UA 100 (A) NEGATIVE mg/dL   Hgb urine dipstick SMALL (A) NEGATIVE   Bilirubin Urine NEGATIVE NEGATIVE   Ketones, ur NEGATIVE NEGATIVE mg/dL   Protein, ur >300 (A) NEGATIVE mg/dL   Nitrite NEGATIVE NEGATIVE   Leukocytes, UA NEGATIVE NEGATIVE  Urine microscopic-add on  Result Value Ref Range   Squamous Epithelial / LPF 0-5 (A) NONE SEEN   WBC, UA 0-5 0 - 5 WBC/hpf   RBC / HPF 0-5 0 - 5 RBC/hpf   Bacteria, UA FEW (A) NONE SEEN   Urine-Other YEAST PRESENT   I-Stat Chem 8, ED  Result Value Ref Range   Sodium 140 135 - 145 mmol/L   Potassium 4.6 3.5 - 5.1 mmol/L   Chloride 113 (H) 101 - 111 mmol/L   BUN 88 (H) 6 - 20 mg/dL   Creatinine, Ser 14.40 (H) 0.61 - 1.24 mg/dL   Glucose, Bld 92 65 - 99 mg/dL   Calcium, Ion 1.15 1.15 - 1.40 mmol/L   TCO2 16 0 - 100 mmol/L   Hemoglobin 5.1 (LL) 13.0 - 17.0 g/dL   HCT 15.0 (L) 39.0 - 52.0 %   Comment NOTIFIED PHYSICIAN   I-stat troponin, ED  Result Value Ref Range   Troponin i, poc 0.03 0.00 - 0.08 ng/mL   Comment 3          POC occult blood, ED  Result Value Ref Range   Fecal Occult Bld  POSITIVE (A) NEGATIVE   Dg Chest 2 View  Result Date: 08/04/2016 CLINICAL DATA:  50 y/o M; shortness of breath and mid chest pain for 1 week. EXAM: CHEST  2 VIEW COMPARISON:  None. FINDINGS: Moderate bronchitic changes. No focal consolidation. No pneumothorax or effusion. Normal cardiac silhouette. Mild degenerative changes of thoracic spine. IMPRESSION: Moderate bronchitic changes.  No focal consolidation. Electronically Signed   By: Kristine Garbe M.D.   On: 08/04/2016 04:10    Procedures Procedures (including critical care time)  Medications Ordered in ED Medications  0.9 %  sodium chloride infusion (not administered)  0.9 %  sodium chloride infusion (not administered)  ipratropium-albuterol (DUONEB) 0.5-2.5 (3) MG/3ML nebulizer solution 3 mL (3 mLs Nebulization Given 08/04/16 0334)  sodium chloride 0.9 % bolus 1,000 mL (1,000 mLs Intravenous New Bag/Given 08/04/16 0445)    Blood transfusion ordered.  Patient consent obtained MDM Reviewed: previous chart and nursing note Reviewed previous: labs Interpretation: labs, x-ray and ECG (No chf by me on CXR anemia of 5.3 on CBC, acute renal failure with cr 14.44 ) Total time providing critical care: > 105 minutes. This excludes time spent performing separately reportable procedures and services. Consults: admitting MD  CRITICAL CARE Performed by: Carlisle Beers Total critical care time: 120 minutes Critical care time was exclusive of separately billable procedures and treating other patients. Critical care was necessary to treat or prevent imminent or life-threatening deterioration. Critical care was time spent personally by me on the following activities: development of treatment plan with patient and/or surrogate as well as nursing, discussions with consultants, evaluation of patient's response to treatment, examination of patient, obtaining history from patient or surrogate, ordering and performing treatments and  interventions, ordering and review of laboratory  studies, ordering and review of radiographic studies, pulse oximetry and re-evaluation of patient's condition.  Final Clinical Impressions(s) / ED Diagnoses  ARF COPD Symptomatic anemia requiring transfusion  I personally performed the services described in this documentation, which was scribed in my presence. The recorded information has been reviewed and is accurate.      Veatrice Kells, MD 08/04/16 [id number]

## 2016-08-04 NOTE — Progress Notes (Signed)
Received signout from Dr. Randal Buba  50 year old male with pmh CKG, tobacco, and alcohol use; who presented with complaints of SOB with cough and wheezing over the last few days. Patient without routine care. Hemoglobin found to be 5.1, BUN 88, creatinine 14.40 (previously 2.81 in 01/2013).  UA positive for yeast. Stools noted to be hard and brown, also guaiac positive. Physical exam revealed periorbital edema, tachycardia, and wheezing. CXR showing moderate bronchitic changes. Patient was given 1 L of fluids, DuoNeb, and set up to be transfused 2 units of blood. Will need to consult nephrology.

## 2016-08-04 NOTE — Progress Notes (Signed)
RN paged because pt BP very high. Pt came in with renal failure and uncontrolled HTN. Hydralazine ordered but parameters unclear to RN. Hydralazine order changed to 10mg  IV q6hrs prn SBP over 180 and/or DBP over 100 and call if BP still this high after prn given. Reviewed GI note. No procedure planned for am. Clear liquids can be continued. D/c'd NPO order.  KJKG, NP [facility]

## 2016-08-04 NOTE — ED Notes (Signed)
To the xray dept

## 2016-08-04 NOTE — ED Notes (Signed)
Report CALLED TO 5W.

## 2016-08-04 NOTE — ED Triage Notes (Signed)
The pt  Arrived by [facility] from home  The pt woke up one hour ago coughing and he could not get his breath.  He also had some chest painrales initially he arrived by ems with c-pap in place pt was removed from c-pap and placed on nasal 02 at2  Pt is a smoker  He has 324mg  of aspirin before ems arrived   Iv per ems  They gave him sl nitro x 3  The nitro appeared to help his breathing they gave him hhn with albuterol5.0 and atrovent 0.5  Alert oriented skin warm and dry

## 2016-08-04 NOTE — ED Notes (Signed)
Admitting a bedside.

## 2016-08-04 NOTE — ED Notes (Signed)
At bedside with pt during his first 15 minutes of transfusion

## 2016-08-04 NOTE — Consult Note (Signed)
[facility] Consult Note     Date: 08/04/2016                  Patient Name:  Jordan [name]  MRN: [id number]  DOB: Apr 24, 1966  Age / Sex: 50 y.o., male         PCP: No PCP Per Patient                 Service Requesting Consult: TRH                 Reason for Consult: Acute on chronic kidney disease            Chief Complaint: SOB  HPI: Pt is a 9M with a PMH significant for EtOH abuse, tobacco abuse, and CKD (creatinine 2.8 in 2014) who is now seen in consultation at the request of Dr. Marily Memos for evaluation and recommendations surrounding presumed acute on chronic kidney disease.    Briefly, pt was in his usual state of health until 2-3 days ago when he began feeling SOB.  He prior to that, he noticed several days of dark tarry stools that occurred after he drank liquor.  This has never happened to him before.  He has not sought routine medical care in many years.    He presented to the ED where his Hgb was found to be 5.1 and creatinine of 14.  No change in urine output.  Strong FH of ESRD (mother and brother).  He denies ingesting antifreeze, rubbing alcohol, methanol, or homemade alcohol.  Past Medical History:  Diagnosis Date  . CKD (chronic kidney disease)   . ETOH abuse     Past Surgical History:  Procedure Laterality Date  . NO PAST SURGERIES      Family History  Problem Relation Age of Onset  . Diabetes Mother   . Kidney failure Father   . Kidney failure Brother    Social History:  reports that he has been smoking Cigarettes.  He has a 7.50 pack-year smoking history. He has never used smokeless tobacco. He reports that he drinks about 12.6 oz of alcohol per week . His drug history is not on file.  Allergies: No Known Allergies  No prescriptions prior to admission.    Results for orders placed or performed during the hospital encounter of 08/04/16 (from the past 48 hour(s))  CBC with Differential/Platelet     Status: Abnormal   Collection  Time: 08/04/16  2:53 AM  Result Value Ref Range   WBC 8.0 4.0 - 10.5 K/uL   RBC 1.68 (L) 4.22 - 5.81 MIL/uL   Hemoglobin 5.3 (LL) 13.0 - 17.0 g/dL    Comment: REPEATED TO VERIFY CRITICAL RESULT CALLED TO, READ BACK BY AND VERIFIED WITH: C.CRISCOE,RN [id number] 08/04/16 M.CAMPBELL    HCT 16.2 (L) 39.0 - 52.0 %   MCV 96.4 78.0 - 100.0 fL   MCH 31.5 26.0 - 34.0 pg   MCHC 32.7 30.0 - 36.0 g/dL   RDW 14.2 11.5 - 15.5 %   Platelets 158 150 - 400 K/uL   Neutrophils Relative % 70 %   Lymphocytes Relative 15 %   Monocytes Relative 6 %   Eosinophils Relative 9 %   Basophils Relative 0 %   Neutro Abs 5.6 1.7 - 7.7 K/uL   Lymphs Abs 1.2 0.7 - 4.0 K/uL   Monocytes Absolute 0.5 0.1 - 1.0 K/uL   Eosinophils Absolute 0.7 0.0 - 0.7 K/uL   Basophils Absolute 0.0  0.0 - 0.1 K/uL   RBC Morphology POLYCHROMASIA PRESENT   Hepatic function panel     Status: Abnormal   Collection Time: 08/04/16  2:53 AM  Result Value Ref Range   Total Protein 6.4 (L) 6.5 - 8.1 g/dL   Albumin 3.2 (L) 3.5 - 5.0 g/dL   AST 22 15 - 41 U/L   ALT 20 17 - 63 U/L   Alkaline Phosphatase 78 38 - 126 U/L   Total Bilirubin 0.4 0.3 - 1.2 mg/dL   Bilirubin, Direct <0.1 (L) 0.1 - 0.5 mg/dL   Indirect Bilirubin NOT CALCULATED 0.3 - 0.9 mg/dL  Acetaminophen level     Status: Abnormal   Collection Time: 08/04/16  3:00 AM  Result Value Ref Range   Acetaminophen (Tylenol), Serum <10 (L) 10 - 30 ug/mL    Comment:        THERAPEUTIC CONCENTRATIONS VARY SIGNIFICANTLY. A RANGE OF 10-30 ug/mL MAY BE AN EFFECTIVE CONCENTRATION FOR MANY PATIENTS. HOWEVER, SOME ARE BEST TREATED AT CONCENTRATIONS OUTSIDE THIS RANGE. ACETAMINOPHEN CONCENTRATIONS >150 ug/mL AT 4 HOURS AFTER INGESTION AND >50 ug/mL AT 12 HOURS AFTER INGESTION ARE OFTEN ASSOCIATED WITH TOXIC REACTIONS.   Salicylate level     Status: None   Collection Time: 08/04/16  3:00 AM  Result Value Ref Range   Salicylate Lvl <0.8 2.8 - 30.0 mg/dL  I-stat troponin, ED     Status:  None   Collection Time: 08/04/16  3:03 AM  Result Value Ref Range   Troponin i, poc 0.03 0.00 - 0.08 ng/mL   Comment 3            Comment: Due to the release kinetics of cTnI, a negative result within the first hours of the onset of symptoms does not rule out myocardial infarction with certainty. If myocardial infarction is still suspected, repeat the test at appropriate intervals.   I-Stat Chem 8, ED     Status: Abnormal   Collection Time: 08/04/16  3:05 AM  Result Value Ref Range   Sodium 140 135 - 145 mmol/L   Potassium 4.6 3.5 - 5.1 mmol/L   Chloride 113 (H) 101 - 111 mmol/L   BUN 88 (H) 6 - 20 mg/dL   Creatinine, Ser 14.40 (H) 0.61 - 1.24 mg/dL   Glucose, Bld 92 65 - 99 mg/dL   Calcium, Ion 1.15 1.15 - 1.40 mmol/L   TCO2 16 0 - 100 mmol/L   Hemoglobin 5.1 (LL) 13.0 - 17.0 g/dL   HCT 15.0 (L) 39.0 - 52.0 %   Comment NOTIFIED PHYSICIAN   Prepare RBC     Status: None   Collection Time: 08/04/16  3:09 AM  Result Value Ref Range   Order Confirmation ORDER PROCESSED BY BLOOD BANK   POC occult blood, ED     Status: Abnormal   Collection Time: 08/04/16  4:46 AM  Result Value Ref Range   Fecal Occult Bld POSITIVE (A) NEGATIVE  Urinalysis, Routine w reflex microscopic (not at [facility])     Status: Abnormal   Collection Time: 08/04/16  4:48 AM  Result Value Ref Range   Color, Urine STRAW (A) YELLOW   APPearance CLOUDY (A) CLEAR   Specific Gravity, Urine 1.011 1.005 - 1.030   pH 5.5 5.0 - 8.0   Glucose, UA 100 (A) NEGATIVE mg/dL   Hgb urine dipstick SMALL (A) NEGATIVE   Bilirubin Urine NEGATIVE NEGATIVE   Ketones, ur NEGATIVE NEGATIVE mg/dL   Protein, ur >300 (A) NEGATIVE mg/dL  Nitrite NEGATIVE NEGATIVE   Leukocytes, UA NEGATIVE NEGATIVE  Urine microscopic-add on     Status: Abnormal   Collection Time: 08/04/16  4:48 AM  Result Value Ref Range   Squamous Epithelial / LPF 0-5 (A) NONE SEEN   WBC, UA 0-5 0 - 5 WBC/hpf   RBC / HPF 0-5 0 - 5 RBC/hpf   Bacteria, UA FEW (A)  NONE SEEN   Urine-Other YEAST PRESENT   Vitamin B12     Status: None   Collection Time: 08/04/16  5:43 AM  Result Value Ref Range   Vitamin B-12 220 180 - 914 pg/mL    Comment: (NOTE) This assay is not validated for testing neonatal or myeloproliferative syndrome specimens for Vitamin B12 levels.   Folate     Status: None   Collection Time: 08/04/16  5:43 AM  Result Value Ref Range   Folate 10.6 >5.9 ng/mL  Iron and TIBC     Status: Abnormal   Collection Time: 08/04/16  5:43 AM  Result Value Ref Range   Iron 29 (L) 45 - 182 ug/dL   TIBC 423 250 - 450 ug/dL   Saturation Ratios 7 (L) 17.9 - 39.5 %   UIBC 394 ug/dL  Ferritin     Status: Abnormal   Collection Time: 08/04/16  5:43 AM  Result Value Ref Range   Ferritin 16 (L) 24 - 336 ng/mL  Reticulocytes     Status: Abnormal   Collection Time: 08/04/16  5:43 AM  Result Value Ref Range   Retic Ct Pct 2.3 0.4 - 3.1 %   RBC. 1.68 (L) 4.22 - 5.81 MIL/uL   Retic Count, Manual 38.6 19.0 - 186.0 K/uL  Type and screen [place]     Status: None (Preliminary result)   Collection Time: 08/04/16  7:00 AM  Result Value Ref Range   ABO/RH(D) O NEG    Antibody Screen NEG    Sample Expiration 08/07/2016    Unit Number [id number]    Blood Component Type RED CELLS,LR    Unit division 00    Status of Unit ISSUED    Transfusion Status OK TO TRANSFUSE    Crossmatch Result Compatible    Unit Number [id number]    Blood Component Type RED CELLS,LR    Unit division 00    Status of Unit ISSUED    Transfusion Status OK TO TRANSFUSE    Crossmatch Result Compatible    Unit Number [id number]    Blood Component Type RED CELLS,LR    Unit division 00    Status of Unit ALLOCATED    Transfusion Status OK TO TRANSFUSE    Crossmatch Result Compatible    Unit Number [id number]    Blood Component Type RED CELLS,LR    Unit division 00    Status of Unit ALLOCATED    Transfusion Status OK TO TRANSFUSE    Crossmatch Result  Compatible   ABO/Rh     Status: None   Collection Time: 08/04/16  7:00 AM  Result Value Ref Range   ABO/RH(D) O NEG   TSH     Status: None   Collection Time: 08/04/16  8:25 AM  Result Value Ref Range   TSH 1.742 0.350 - 4.500 uIU/mL    Comment: Performed by a 3rd Generation assay with a functional sensitivity of <=0.01 uIU/mL.   Dg Chest 2 View  Result Date: 08/04/2016 CLINICAL DATA:  50 y/o M; shortness of breath and mid chest pain for  1 week. EXAM: CHEST  2 VIEW COMPARISON:  None. FINDINGS: Moderate bronchitic changes. No focal consolidation. No pneumothorax or effusion. Normal cardiac silhouette. Mild degenerative changes of thoracic spine. IMPRESSION: Moderate bronchitic changes.  No focal consolidation. Electronically Signed   By: Kristine Garbe M.D.   On: 08/04/2016 04:10   US Renal  Result Date: 08/04/2016 CLINICAL DATA:  Chronic renal insufficiency EXAM: RENAL / URINARY TRACT ULTRASOUND COMPLETE COMPARISON:  None in PACs FINDINGS: Right Kidney: Length: 7.2 cm. The renal cortical echotexture is markedly increased and greater than that of the adjacent liver. There is no hydronephrosis. No discrete mass is observed. Left Kidney: Length: 9.1 cm. The renal cortical echotexture is markedly increased similar to that on the right. There is no hydronephrosis nor parenchymal mass. Bladder: The partially distended urinary bladder is normal. The prostate gland produces a mild irregular impression upon the urinary bladder base. IMPRESSION: Renal cortical atrophy and increased renal cortical echotexture consistent with medical renal disease. No hydronephrosis. Normal appearance of the urinary bladder. Electronically Signed   By: David  Martinique M.D.   On: 08/04/2016 13:25    ROS  Blood pressure (!) 173/108, pulse (!) 102, temperature 98.8 F (37.1 C), temperature source Oral, resp. rate 20, height 5\' 4"  (1.626 m), weight 74.8 kg (165 lb), SpO2 98 %. Physical Exam  GEN: NAD, lying flat in  bed HEENT: EOMI, PERRL, sclerae muddy NECK: Supple, no JVD or bruit PULM: CTAB no c/w/r CV : tachcardic, II/VI systolic murmur, no r/g ABD: soft, nondistended, nontender, +liver edge 3 cm below costal margin.  Spleen not appreciated EXT: no LE edema NEURO: AAO x 3, no asterixis MSK: no synovitis SKIN: no rashes or lesions, mult tattoos  URINE SEDIMENT: ~2 WBCs/ hpf, rare OFB.  One possible broad-based granular cast fragment.  Otherwise no crystals or hematuria.   Assessment/Plan  1.  Presumed acute on chronic kidney injury: possibly due to ischemia in the setting of symptomatic anemia.  Unclear what his other medical issues are but is hypertensive here.  Adding on HgbA1c, Hep B/C, HIV, RPR.  SPEP and UPEP are pending.  UP/C as well.  ANA (doubt GN).  Will await renal US ; may need to biopsy.  No indication for HD as of right now but will follow closely for metabolic derangements or development of uremia.  Based on I-stat labs, AG 11 (corrected for albumin is 11.2), doubt toxic alcohol ingestion.  Tylenol and salicylate levels negative.   2.  Symptomatic anemia: Tsat 7, getting ferraheme and 1u pRBCs.  May be a chronic component but some is likely acute based on history of dark stools.  May need Aranesp.  3.  Dark tarry stools: GI consult.  4.  EtOH abuse: CIWA protocol  5.  Tobacco abuse: nicotine patch.   Madelon Lips, MD [facility] Cell [phone] Pager [phone] 08/04/2016, 1:59 PM

## 2016-08-04 NOTE — Consult Note (Signed)
Referring Provider:  Dr. Linna Darner [facility] EDP) Primary Care Physician:  No PCP Per Patient Primary Gastroenterologist:  None (unassigned)  Reason for Consultation:  Heme positive stool, iron deficiency, and anemia  HPI: Jordan [name] is a 50 y.o. male admitted to the hospital this morning through the emergency room, because of weakness and shortness of breath.  Evaluation in the emergency room showed marked laboratory abnormalities, including hemoglobin of 5 as opposed to his baseline of 15.5 in 2014, and acute on chronic renal failure with creatinine of 14.4 (BUN 88), as compared to a baseline of 2.8 in 2014.  The patient smokes and is apparently a fairly heavy drinker. He has not been under regular medical care. 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 However, over the past month, he has noticed significant exertional dyspnea, and recently, he has had a productive cough, associated with chest pain while coughing. No exertional chest pain.  In the emergency room, rectal examination by the EDP showed firm, normal colored stool that was Hemoccult positive. The patient's labs are consistent with iron deficiency, including ferritin of 16 and iron saturation of 7%, although his MCV is normal.  The patient took a BC powder the night before presenting to the emergency room but denies any ongoing use of aspirin products or nonsteroidal anti-inflammatory drugs.  There is no family history of colon cancer or of other GI tract diseases.   Past Medical History:  Diagnosis Date  . CKD (chronic kidney disease)   . ETOH abuse     Past Surgical History:  Procedure Laterality Date  . NO PAST SURGERIES      Prior to Admission medications   Not on File    Current Facility-Administered Medications  Medication Dose Route Frequency Provider Last Rate Last Dose  . 0.9 %  sodium chloride infusion  10 mL/hr Intravenous Once April Palumbo, MD      . 0.9 %  sodium  chloride infusion   Intravenous Continuous Norval Morton, MD 10 mL/hr at 08/04/16 0826    . acetaminophen (TYLENOL) tablet 650 mg  650 mg Oral Q6H PRN Rondel Jumbo, PA-C       Or  . acetaminophen (TYLENOL) suppository 650 mg  650 mg Rectal Q6H PRN Rondel Jumbo, PA-C      . albuterol (PROVENTIL) (2.5 MG/3ML) 0.083% nebulizer solution 2.5 mg  2.5 mg Nebulization Q4H PRN Waldemar Dickens, MD   2.5 mg at 08/04/16 1457  . bisacodyl (DULCOLAX) suppository 10 mg  10 mg Rectal Daily PRN Rondel Jumbo, PA-C      . ferumoxytol [facility]) 510 mg in sodium chloride 0.9 % 100 mL IVPB  510 mg Intravenous Once Waldemar Dickens, MD      . folic acid (FOLVITE) tablet 1 mg  1 mg Oral Daily Rondel Jumbo, PA-C   1 mg at 08/04/16 1014  . hydrALAZINE (APRESOLINE) injection 5-10 mg  5-10 mg Intravenous Q8H PRN Rondel Jumbo, PA-C      . LORazepam (ATIVAN) tablet 1 mg  1 mg Oral Q6H PRN Rondel Jumbo, PA-C       Or  . LORazepam (ATIVAN) injection 1 mg  1 mg Intravenous Q6H PRN Rondel Jumbo, PA-C      . magnesium citrate solution 1 Bottle  1 Bottle Oral Once PRN Rondel Jumbo, PA-C      . multivitamin with minerals tablet 1 tablet  1 tablet  Oral Daily Rondel Jumbo, PA-C   1 tablet at 08/04/16 1014  . nicotine (NICODERM CQ - dosed in mg/24 hours) patch 14 mg  14 mg Transdermal Daily Rondel Jumbo, PA-C   14 mg at 08/04/16 1014  . ondansetron (ZOFRAN) tablet 4 mg  4 mg Oral Q6H PRN Rondel Jumbo, PA-C       Or  . ondansetron [facility]) injection 4 mg  4 mg Intravenous Q6H PRN Rondel Jumbo, PA-C      . senna-docusate (Senokot-S) tablet 1 tablet  1 tablet Oral QHS PRN Rondel Jumbo, PA-C      . sodium chloride flush (NS) 0.9 % injection 3 mL  3 mL Intravenous Q12H Rondel Jumbo, PA-C      . thiamine (VITAMIN B-1) tablet 100 mg  100 mg Oral Daily Rondel Jumbo, PA-C   100 mg at 08/04/16 1014   Or  . thiamine (B-1) injection 100 mg  100 mg Intravenous Daily Rondel Jumbo, PA-C      . traZODone (DESYREL)  tablet 25 mg  25 mg Oral QHS PRN Rondel Jumbo, PA-C        Allergies as of 08/04/2016  . (No Known Allergies)    Family History  Problem Relation Age of Onset  . Diabetes Mother   . Kidney failure Father   . Kidney failure Brother     Social History   Social History  . Marital status: Single    Spouse name: N/A  . Number of children: N/A  . Years of education: N/A   Occupational History  . Not on file.   Social History Main Topics  . Smoking status: Current Every Day Smoker    Packs/day: 0.50    Years: 15.00    Types: Cigarettes  . Smokeless tobacco: Never Used  . Alcohol use 12.6 oz/week    21 Cans of beer per week  . Drug use: Unknown  . Sexual activity: Not on file   Other Topics Concern  . Not on file   Social History Narrative  . No narrative on file    Review of Systems: Remarkably negative for GI tract symptoms. The patient has not noticed dark stools, rectal bleeding, anorexia, weight loss, reflux, dysphagia, nausea, epigastric pain, constipation, or diarrhea. No exertional chest pain, some exertional dyspnea as per history of present illness, cough as per history of present illness, no skin problems or lymph node swelling, no joint problems or urinary difficulties.  Physical Exam: Vital signs in last 24 hours: Temp:  [98.2 F (36.8 C)-98.8 F (37.1 C)] 98.8 F (37.1 C) (11/13 1221) Pulse Rate:  [56-126] 102 (11/13 1221) Resp:  [18-28] 20 (11/13 1221) BP: (125-195)/(98-113) 173/108 (11/13 1221) SpO2:  [95 %-100 %] 99 % (11/13 1457) Weight:  [74.8 kg (165 lb)] 74.8 kg (165 lb) (11/13 0239)   General:   Alert,  Well-developed, well-nourished, pleasant and cooperative in NAD. The patient does not look sick at all, lying in bed, despite his severe anemia and azotemia. Head:  Normocephalic and atraumatic. Eyes:  Sclera clear, no icterus.   Conjunctiva pink, despite anemia (status post transfusion). Mouth:   No ulcerations or lesions.  Oropharynx pink &  moist. Neck:   No masses or thyromegaly. Lungs:  Soft expiratory wheeze in right lung field anteriorly. Heart:  Slightly rapid rate, regular rhythm; no murmurs, clicks, rubs,  or gallops. Specifically, I am unable to hear any pericardial rub to suggest uremic  pericarditis. Abdomen:  Soft, nontender, nontympanitic, and nondistended. No masses, hepatosplenomegaly or ventral hernias noted. Normal bowel sounds, without bruits, guarding, or rebound.   Rectal:  Per EDP., Showed formed, normal colored stool, Hemoccult positive Msk:   Symmetrical without gross deformities. Extremities:   Without clubbing, cyanosis, or edema. Neurologic:  Alert and coherent;  grossly normal neurologically. Skin:  Intact without significant lesions or rashes. Cervical Nodes:  No significant cervical adenopathy. Psych:   Alert and cooperative. Normal mood and affect.  Intake/Output from previous day: 11/12 0701 - 11/13 0700 In: 1000 [I.V.:1000] Out: 160 [Urine:160] Intake/Output this shift: Total I/O In: 791.7 [I.V.:85.7; Blood:706] Out: 250 [Urine:250]  Lab Results:  Recent Labs  08/04/16 0253 08/04/16 0305 08/04/16 1423  WBC 8.0  --   --   HGB 5.3* 5.1* 8.3*  HCT 16.2* 15.0* 24.9*  PLT 158  --   --    BMET  Recent Labs  08/04/16 0305  NA 140  K 4.6  CL 113*  GLUCOSE 92  BUN 88*  CREATININE 14.40*   LFT  Recent Labs  08/04/16 0253  PROT 6.4*  ALBUMIN 3.2*  AST 22  ALT 20  ALKPHOS 78  BILITOT 0.4  BILIDIR <0.1*  IBILI NOT CALCULATED   PT/INR No results for input(s): LABPROT, INR in the last 72 hours.  Studies/Results: Dg Chest 2 View  Result Date: 08/04/2016 CLINICAL DATA:  50 y/o M; shortness of breath and mid chest pain for 1 week. EXAM: CHEST  2 VIEW COMPARISON:  None. FINDINGS: Moderate bronchitic changes. No focal consolidation. No pneumothorax or effusion. Normal cardiac silhouette. Mild degenerative changes of thoracic spine. IMPRESSION: Moderate bronchitic changes.  No  focal consolidation. Electronically Signed   By: Kristine Garbe M.D.   On: 08/04/2016 04:10   US Renal  Result Date: 08/04/2016 CLINICAL DATA:  Chronic renal insufficiency EXAM: RENAL / URINARY TRACT ULTRASOUND COMPLETE COMPARISON:  None in PACs FINDINGS: Right Kidney: Length: 7.2 cm. The renal cortical echotexture is markedly increased and greater than that of the adjacent liver. There is no hydronephrosis. No discrete mass is observed. Left Kidney: Length: 9.1 cm. The renal cortical echotexture is markedly increased similar to that on the right. There is no hydronephrosis nor parenchymal mass. Bladder: The partially distended urinary bladder is normal. The prostate gland produces a mild irregular impression upon the urinary bladder base. IMPRESSION: Renal cortical atrophy and increased renal cortical echotexture consistent with medical renal disease. No hydronephrosis. Normal appearance of the urinary bladder. Electronically Signed   By: David  Martinique M.D.   On: 08/04/2016 13:25    Impression: 1. Profound anemia, probably multifactorial 2. Iron deficiency 3. Heme positive stool 4. Severe azotemia 5. Tobacco and alcohol exposure  Discussion: It is unclear whether the renal disease is the primary cause of the patient's anemia, leading to perhaps an element of uremic gastropathy with Hemoccult positive stool on that basis (this would not readily explain the iron deficiency), or whether there is some GI tract pathology causing anemia from chronic GI tract blood loss, which put a strain on the patient's pre-existing renal dysfunction, leading to frank azotemia.  Plan: The patient clearly needs endoscopy and colonoscopy, possibly with small bowel capsule endoscopy if those studies are negative. The nature, purpose, and risks of upper endoscopy and colonoscopy were reviewed with the patient. However, I do not think that the patient should undergo such testing until there is improvement in his  blood pressure and his renal function.  I  would favor continuation of a liquid diet and I will start the patient on MiraLAX as sort of a preliminary prep while awaiting medical stabilization.  Please feel free to call me to discuss his case in more detail at any time.    LOS: 0 days   Charnae Lill V  08/04/2016, 6:53 PM   Pager [phone] If no answer or after 5 PM call [phone]

## 2016-08-04 NOTE — ED Notes (Signed)
The pt  Reports that a week ago his stools were black but went back to normal

## 2016-08-05 ENCOUNTER — Inpatient Hospital Stay (HOSPITAL_COMMUNITY): Payer: Medicaid Other

## 2016-08-05 DIAGNOSIS — N186 End stage renal disease: Secondary | ICD-10-CM

## 2016-08-05 LAB — COMPREHENSIVE METABOLIC PANEL
ALT: 16 U/L — ABNORMAL LOW (ref 17–63)
AST: 15 U/L (ref 15–41)
Albumin: 3.1 g/dL — ABNORMAL LOW (ref 3.5–5.0)
Alkaline Phosphatase: 82 U/L (ref 38–126)
Anion gap: 10 (ref 5–15)
BUN: 69 mg/dL — ABNORMAL HIGH (ref 6–20)
CO2: 16 mmol/L — ABNORMAL LOW (ref 22–32)
Calcium: 9.4 mg/dL (ref 8.9–10.3)
Chloride: 115 mmol/L — ABNORMAL HIGH (ref 101–111)
Creatinine, Ser: 11.98 mg/dL — ABNORMAL HIGH (ref 0.61–1.24)
GFR calc Af Amer: 5 mL/min — ABNORMAL LOW (ref >60)
GFR calc non Af Amer: 4 mL/min — ABNORMAL LOW (ref >60)
Glucose, Bld: 92 mg/dL (ref 65–99)
Potassium: 4.8 mmol/L (ref 3.5–5.1)
Sodium: 141 mmol/L (ref 135–145)
Total Bilirubin: 0.9 mg/dL (ref 0.3–1.2)
Total Protein: 6.6 g/dL (ref 6.5–8.1)

## 2016-08-05 LAB — URINE CULTURE: Culture: NO GROWTH

## 2016-08-05 LAB — PROTEIN ELECTROPHORESIS, SERUM
A/G Ratio: 1 (ref 0.7–1.7)
Albumin ELP: 3.3 g/dL (ref 2.9–4.4)
Alpha-1-Globulin: 0.2 g/dL (ref 0.0–0.4)
Alpha-2-Globulin: 0.7 g/dL (ref 0.4–1.0)
Beta Globulin: 1.2 g/dL (ref 0.7–1.3)
Gamma Globulin: 1.1 g/dL (ref 0.4–1.8)
Globulin, Total: 3.2 g/dL (ref 2.2–3.9)
Total Protein ELP: 6.5 g/dL (ref 6.0–8.5)

## 2016-08-05 LAB — IMMUNOFIXATION ELECTROPHORESIS
IgA: 273 mg/dL (ref 90–386)
IgG (Immunoglobin G), Serum: 1139 mg/dL (ref 700–1600)
IgM, Serum: 65 mg/dL (ref 20–172)
Total Protein ELP: 6.7 g/dL (ref 6.0–8.5)

## 2016-08-05 LAB — RPR: RPR Ser Ql: NONREACTIVE

## 2016-08-05 LAB — HEPATITIS B SURFACE ANTIGEN: Hepatitis B Surface Ag: NEGATIVE

## 2016-08-05 LAB — CBC
HCT: 23.7 % — ABNORMAL LOW (ref 39.0–52.0)
Hemoglobin: 8 g/dL — ABNORMAL LOW (ref 13.0–17.0)
MCH: 30.9 pg (ref 26.0–34.0)
MCHC: 33.8 g/dL (ref 30.0–36.0)
MCV: 91.5 fL (ref 78.0–100.0)
Platelets: 166 10*3/uL (ref 150–400)
RBC: 2.59 MIL/uL — ABNORMAL LOW (ref 4.22–5.81)
RDW: 17.2 % — ABNORMAL HIGH (ref 11.5–15.5)
WBC: 9.2 10*3/uL (ref 4.0–10.5)

## 2016-08-05 LAB — HEMOGLOBIN A1C
Hgb A1c MFr Bld: 5.3 % (ref 4.8–5.6)
Mean Plasma Glucose: 105 mg/dL

## 2016-08-05 LAB — ANTINUCLEAR ANTIBODIES, IFA: ANA Ab, IFA: POSITIVE — AB

## 2016-08-05 LAB — HCV COMMENT:

## 2016-08-05 LAB — PHOSPHORUS: Phosphorus: 4.9 mg/dL — ABNORMAL HIGH (ref 2.5–4.6)

## 2016-08-05 LAB — HIV ANTIBODY (ROUTINE TESTING W REFLEX): HIV Screen 4th Generation wRfx: NONREACTIVE

## 2016-08-05 LAB — FANA STAINING PATTERNS: Homogeneous Pattern: 1:80 {titer}

## 2016-08-05 LAB — HEPATITIS B SURFACE ANTIBODY,QUALITATIVE: Hep B S Ab: REACTIVE

## 2016-08-05 LAB — HEPATITIS C ANTIBODY (REFLEX): HCV Ab: <0.1 s/co ratio (ref 0.0–0.9)

## 2016-08-05 MED ORDER — AMLODIPINE BESYLATE 10 MG PO TABS
10.0000 mg | ORAL_TABLET | Freq: Every day | ORAL | Status: DC
Start: 2016-08-05 — End: 2016-08-12
  Administered 2016-08-05 – 2016-08-12 (×8): 10 mg via ORAL
  Filled 2016-08-05 (×8): qty 1

## 2016-08-05 MED ORDER — INFLUENZA VAC SPLIT QUAD 0.5 ML IM SUSY
0.5000 mL | PREFILLED_SYRINGE | INTRAMUSCULAR | Status: AC | PRN
  Administered 2016-08-09: 0.5 mL via INTRAMUSCULAR
  Filled 2016-08-05: qty 0.5

## 2016-08-05 MED ORDER — DARBEPOETIN ALFA 40 MCG/0.4ML IJ SOSY
40.0000 ug | PREFILLED_SYRINGE | INTRAMUSCULAR | Status: DC
  Administered 2016-08-05: 40 ug via SUBCUTANEOUS
  Filled 2016-08-05: qty 0.4

## 2016-08-05 MED ORDER — LABETALOL HCL 5 MG/ML IV SOLN: 10.0000 mg | INTRAVENOUS | Status: DC | PRN

## 2016-08-05 MED ORDER — POLYETHYLENE GLYCOL 3350 17 G PO PACK
17.0000 g | PACK | Freq: Two times a day (BID) | ORAL | Status: DC
  Administered 2016-08-05 – 2016-08-11 (×6): 17 g via ORAL
  Filled 2016-08-05 (×10): qty 1

## 2016-08-05 MED ORDER — PNEUMOCOCCAL VAC POLYVALENT 25 MCG/0.5ML IJ INJ
0.5000 mL | INJECTION | INTRAMUSCULAR | Status: AC | PRN
  Administered 2016-08-10: 0.5 mL via INTRAMUSCULAR
  Filled 2016-08-05: qty 0.5

## 2016-08-05 MED ORDER — CARVEDILOL 6.25 MG PO TABS
6.2500 mg | ORAL_TABLET | Freq: Two times a day (BID) | ORAL | Status: DC
  Administered 2016-08-05 – 2016-08-12 (×15): 6.25 mg via ORAL
  Filled 2016-08-05 (×14): qty 1

## 2016-08-05 NOTE — Progress Notes (Signed)
[facility] Progress Note    Assessment/ Plan:   1.  Presumed acute kidney injury on chronic kidney disease Stage V: possibly due to ischemia in the setting of symptomatic anemia.  Unclear what his other medical issues are but is hypertensive here.  Hep B/C, HIV, RPR all negative.  SPEP and UPEP are pending.  UP/C with ~ 2 g protein.  ANA pending. Renal U/S showing very echogenic kidneys without hydro.  Cr improving but I am unsure how much it will continue to improve as I suspect a lot of this is chronic.  He may need to start HD here in house but he doesn't have any uremic symptoms and no metabolic derangements.  Continue to follow renal function.  Vein mapping ordered for today.  2.  Symptomatic anemia: Tsat 7, getting ferraheme and 1u pRBCs.  May be a chronic component but some is likely acute based on history of dark stools.  starting Aranesp 40 q week.  Hgb goal 10-11.  3.  Dark tarry stools: GI consult, planning for scopes closer to d/c  4.  HTN: amlodipine 10 mg daily added today, also adding Coreg 6.25 mg BID.  Will uptitrate; can add diuretic (ie chlorthalidone) at some point if more control needed but won't at this time since element of AKI.  No ACEi/ARB.    4.  EtOH abuse: no tremor today, has Ativan if needed  5.  Tobacco abuse: nicotine patch.  6.  Bone/mineral: adding on PTH and phos to labs.    Subjective:     Feeling well.  Hypertensive.  Amlodipine added this AM.   Objective:   BP (!) 174/91 (BP Location: Right Arm)   Pulse (!) 120   Temp 98 F (36.7 C) (Oral)   Resp 16   Ht 5\' 4"  (1.626 m)   Wt 75 kg (165 lb 5.5 oz)   SpO2 100%   BMI 28.38 kg/m   Intake/Output Summary (Last 24 hours) at 08/05/16 1137 Last data filed at 08/05/16 1100  Gross per 24 hour  Intake           342.34 ml  Output             1400 ml  Net         -1057.66 ml   Weight change: 0.156 kg (5.5 oz)  Physical Exam: GEN: NAD, standing up HEENT: EOMI, PERRL, sclerae  muddy NECK: Supple, no JVD or bruit PULM: CTAB no c/w/r CV : tachcardic, II/VI systolic murmur, no r/g ABD: soft, nondistended, nontender, +liver edge 3 cm below costal margin.  Spleen not appreciated EXT: no LE edema NEURO: AAO x 3, no asterixis, no tremor MSK: no synovitis SKIN: no rashes or lesions, mult tattoos  Imaging: Dg Chest 2 View  Result Date: 08/04/2016 CLINICAL DATA:  50 y/o M; shortness of breath and mid chest pain for 1 week. EXAM: CHEST  2 VIEW COMPARISON:  None. FINDINGS: Moderate bronchitic changes. No focal consolidation. No pneumothorax or effusion. Normal cardiac silhouette. Mild degenerative changes of thoracic spine. IMPRESSION: Moderate bronchitic changes.  No focal consolidation. Electronically Signed   By: Kristine Garbe M.D.   On: 08/04/2016 04:10   US Renal  Result Date: 08/04/2016 CLINICAL DATA:  Chronic renal insufficiency EXAM: RENAL / URINARY TRACT ULTRASOUND COMPLETE COMPARISON:  None in PACs FINDINGS: Right Kidney: Length: 7.2 cm. The renal cortical echotexture is markedly increased and greater than that of the adjacent liver. There is no hydronephrosis. No discrete  mass is observed. Left Kidney: Length: 9.1 cm. The renal cortical echotexture is markedly increased similar to that on the right. There is no hydronephrosis nor parenchymal mass. Bladder: The partially distended urinary bladder is normal. The prostate gland produces a mild irregular impression upon the urinary bladder base. IMPRESSION: Renal cortical atrophy and increased renal cortical echotexture consistent with medical renal disease. No hydronephrosis. Normal appearance of the urinary bladder. Electronically Signed   By: David  Martinique M.D.   On: 08/04/2016 13:25    Labs: BMET  Recent Labs Lab 08/04/16 0305 08/05/16 0609  NA 140 141  K 4.6 4.8  CL 113* 115*  CO2  --  16*  GLUCOSE 92 92  BUN 88* 69*  CREATININE 14.40* 11.98*  CALCIUM  --  9.4   CBC  Recent Labs Lab  08/04/16 0253 08/04/16 0305 08/04/16 1423 08/05/16 0609  WBC 8.0  --   --  9.2  NEUTROABS 5.6  --   --   --   HGB 5.3* 5.1* 8.3* 8.0*  HCT 16.2* 15.0* 24.9* 23.7*  MCV 96.4  --   --  91.5  PLT 158  --   --  166    Medications:    . amLODipine  10 mg Oral Daily  . carvedilol  6.25 mg Oral BID WC  . folic acid  1 mg Oral Daily  . multivitamin with minerals  1 tablet Oral Daily  . nicotine  14 mg Transdermal Daily  . polyethylene glycol  17 g Oral BID  . sodium chloride flush  3 mL Intravenous Q12H  . thiamine  100 mg Oral Daily   Or  . thiamine  100 mg Intravenous Daily     Madelon Lips MD [facility] Cell [phone] pgr [phone] 08/05/2016, 11:37 AM

## 2016-08-05 NOTE — Progress Notes (Signed)
PROGRESS NOTE    Jordan [name]  WUJ:[id number] DOB: [birth year] DOA: 08/04/2016 PCP: No PCP Per Patient   Brief Narrative:  Jordan [name] is a 50 y.o. male not seen by a PCP in several years, with a history of tobacco abuse, presenting to the ED with several day history of progressive shortness of breath with wheezing waking him out from his sleep. Dyspnea is worse with exertion, cannot walk more than one block before experiencing these symptoms. He denies any hemoptysis. He denies any gross blood in the stools. Patient's hemoglobin on admission was 5.1. He received 2 units of packed red blood cells. He has never had a colonoscopy. GI was consulted as well as Nephrology for AKI.    Assessment & Plan:   Active Problems:   Tobacco abuse   ETOH abuse   Uncontrolled hypertension   Acute renal failure (ARF) (HCC)   Shortness of breath   Symptomatic anemia   Acute renal failure (HCC)   Hypertensive emergency  Symptomatic anemia, combination of iron deficiency and blood loss anemia  -Received 2 units of packed red blood cells on 11/13  -Hemoccult positive  -Started on iron supplementation  -GI following: EGD and colonoscopy when patient's BP and kidney function more stable  -CLD for now   Acute kidney injury on CKD stage 5 -Nephrology following: possibly renal failure due to ischemia in setting of symptomatic anemia  -Renal US showed medical renal disease  -ANA, SPEP, UPEP pending  -Vein mapping ordered per nephro, may need HD while in patient but will cont to monitor for now   Uncontrolled hypertension -Start Norvasc 10 mg daily, coreg 6.25mg  BID  -No ACE/ARB -Hydralazine and labetalol as needed to maintain SBP less than 180  Alcohol abuse CIWA scale  Tobacco abuse -Nicotine patch -Cessation counseling   DVT prophylaxis: SCDs Code Status: Full Family Communication: no family at bedside Disposition Plan: pending further improvement    Consultants:    GI  Nephrology  Procedures:   None  Antimicrobials:   None     Subjective: Patient states that his breathing has improved since admission. No other complaints. Feeling well otherwise. No headaches, vision changes, chest pain, nausea or vomiting.   Objective: Vitals:   08/05/16 0537 08/05/16 0557 08/05/16 0558 08/05/16 0645  BP: (!) 181/109  (!) 181/109 (!) 174/91  Pulse: (!) 112   (!) 120  Resp:      Temp:      TempSrc:      SpO2:      Weight:  75 kg (165 lb 5.5 oz)    Height:        Intake/Output Summary (Last 24 hours) at 08/05/16 0928 Last data filed at 08/05/16 0414  Gross per 24 hour  Intake           791.67 ml  Output             1000 ml  Net          -208.33 ml   Filed Weights   08/04/16 0239 08/05/16 0557  Weight: 74.8 kg (165 lb) 75 kg (165 lb 5.5 oz)    Examination:  General exam: Appears calm and comfortable  Respiratory system: Clear to auscultation. Respiratory effort normal. Cardiovascular system: S1 & S2 heard, tachycardic, regular rhythm, systolic murmur. No pedal edema. Gastrointestinal system: Abdomen is nondistended, soft and nontender. Normal bowel sounds heard. Central nervous system: Alert and oriented. No focal neurological deficits. Extremities: Symmetric 5 x 5 power.  Skin: No rashes, lesions or ulcers Psychiatry: Judgement and insight appear normal. Mood & affect appropriate.   Data Reviewed: I have personally reviewed following labs and imaging studies  CBC:  Recent Labs Lab 08/04/16 0253 08/04/16 0305 08/04/16 1423 08/05/16 0609  WBC 8.0  --   --  9.2  NEUTROABS 5.6  --   --   --   HGB 5.3* 5.1* 8.3* 8.0*  HCT 16.2* 15.0* 24.9* 23.7*  MCV 96.4  --   --  91.5  PLT 158  --   --  323   Basic Metabolic Panel:  Recent Labs Lab 08/04/16 0305 08/05/16 0609  NA 140 141  K 4.6 4.8  CL 113* 115*  CO2  --  16*  GLUCOSE 92 92  BUN 88* 69*  CREATININE 14.40* 11.98*  CALCIUM  --  9.4   GFR: Estimated Creatinine  Clearance: 7 mL/min (by C-G formula based on SCr of 11.98 mg/dL (H)). Liver Function Tests:  Recent Labs Lab 08/04/16 0253 08/05/16 0609  AST 22 15  ALT 20 16*  ALKPHOS 78 82  BILITOT 0.4 0.9  PROT 6.4* 6.6  ALBUMIN 3.2* 3.1*   No results for input(s): LIPASE, AMYLASE in the last 168 hours. No results for input(s): AMMONIA in the last 168 hours. Coagulation Profile: No results for input(s): INR, PROTIME in the last 168 hours. Cardiac Enzymes: No results for input(s): CKTOTAL, CKMB, CKMBINDEX, TROPONINI in the last 168 hours. BNP (last 3 results) No results for input(s): PROBNP in the last 8760 hours. HbA1C: No results for input(s): HGBA1C in the last 72 hours. CBG: No results for input(s): GLUCAP in the last 168 hours. Lipid Profile: No results for input(s): CHOL, HDL, LDLCALC, TRIG, CHOLHDL, LDLDIRECT in the last 72 hours. Thyroid Function Tests:  Recent Labs  08/04/16 0825  TSH 1.742   Anemia Panel:  Recent Labs  08/04/16 0543  VITAMINB12 220  FOLATE 10.6  FERRITIN 16*  TIBC 423  IRON 29*  RETICCTPCT 2.3   Sepsis Labs: No results for input(s): PROCALCITON, LATICACIDVEN in the last 168 hours.  Recent Results (from the past 240 hour(s))  Urine culture     Status: None   Collection Time: 08/04/16 10:17 AM  Result Value Ref Range Status   Specimen Description URINE, CLEAN CATCH  Final   Special Requests NONE  Final   Culture NO GROWTH  Final   Report Status 08/05/2016 FINAL  Final       Radiology Studies: Dg Chest 2 View  Result Date: 08/04/2016 CLINICAL DATA:  50 y/o M; shortness of breath and mid chest pain for 1 week. EXAM: CHEST  2 VIEW COMPARISON:  None. FINDINGS: Moderate bronchitic changes. No focal consolidation. No pneumothorax or effusion. Normal cardiac silhouette. Mild degenerative changes of thoracic spine. IMPRESSION: Moderate bronchitic changes.  No focal consolidation. Electronically Signed   By: Kristine Garbe M.D.   On:  08/04/2016 04:10   US Renal  Result Date: 08/04/2016 CLINICAL DATA:  Chronic renal insufficiency EXAM: RENAL / URINARY TRACT ULTRASOUND COMPLETE COMPARISON:  None in PACs FINDINGS: Right Kidney: Length: 7.2 cm. The renal cortical echotexture is markedly increased and greater than that of the adjacent liver. There is no hydronephrosis. No discrete mass is observed. Left Kidney: Length: 9.1 cm. The renal cortical echotexture is markedly increased similar to that on the right. There is no hydronephrosis nor parenchymal mass. Bladder: The partially distended urinary bladder is normal. The prostate gland produces a mild irregular impression  upon the urinary bladder base. IMPRESSION: Renal cortical atrophy and increased renal cortical echotexture consistent with medical renal disease. No hydronephrosis. Normal appearance of the urinary bladder. Electronically Signed   By: David  Martinique M.D.   On: 08/04/2016 13:25      Scheduled Meds: . amLODipine  10 mg Oral Daily  . folic acid  1 mg Oral Daily  . multivitamin with minerals  1 tablet Oral Daily  . nicotine  14 mg Transdermal Daily  . polyethylene glycol  17 g Oral BID  . sodium chloride flush  3 mL Intravenous Q12H  . thiamine  100 mg Oral Daily   Or  . thiamine  100 mg Intravenous Daily   Continuous Infusions: . sodium chloride 10 mL/hr at 08/04/16 0826     LOS: 1 day    Time spent: 40 minutes   Dessa Phi, DO [facility] [email or web address] Password [email or web address] 08/05/2016, 9:28 AM

## 2016-08-05 NOTE — Progress Notes (Signed)
Patient feels okay.  Hemoglobin stable over past 24 hours, following transfusion.  BUN and creatinine slightly improved.  Elevated blood pressure remains a problem.  Impression: Heme positive stool and iron deficiency anemia in a patient with acute on chronic renal failure and severe hypertension.  Recommendation: Continue observation from GI tract standpoint, since patient does not show evidence of active GI bleeding. Aim for endoscopy and colonoscopy prior to discharge, once blood pressure and renal function have plateaued, hopefully in a more acceptable range than they currently are.  Jordan [name], M.D. Pager [phone] If no answer or after 5 PM call [phone]

## 2016-08-05 NOTE — Progress Notes (Signed)
Addendum to earlier progress note: Patient is on clear liquid diet and twice daily MiraLAX, in anticipation of colonoscopy hopefully in the next several days. This will act as sort of a "preliminary prep."  Cleotis Nipper, M.D. Pager [phone] If no answer or after 5 PM call [phone]

## 2016-08-05 NOTE — Care Management Note (Addendum)
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 SOB, weakness, Cr . >14, Hgb 5.3 (transfused). Renal failure, uncontrolled HTN. UE U/S mapping for fistula today.   NO PCP/ Insurance   Action/Plan:  CM will continue to follow. Watch for HD, F/U needs.   Addendum- [facility] (no apts available at [facility]). Verified with [facility] that clippling is in progress. Will also have EGD tomorrow.   Expected Discharge Date:  08/06/16               Expected Discharge Plan:     In-House Referral:     Discharge planning Services  CM Consult  Post Acute Care Choice:    Choice offered to:     DME Arranged:    DME Agency:     HH Arranged:    HH Agency:     Status of Service:  In process, will continue to follow  If discussed at Long Length of Stay Meetings, dates discussed:    Additional Comments:  Carles Collet, RN 08/05/2016, 10:34 AM

## 2016-08-05 NOTE — Progress Notes (Signed)
Right  Upper Extremity Vein Map    Cephalic  Segment Diameter Depth Comment  1. Axilla 1.7mm 3.58mm   2. Mid upper arm 1.33mm 2.61mm   3. Above [facility] 1.49mm 2.57mm   4. In [facility] 1.39mm 4.107mm   5. Below AC   Unable to visualize  6. Mid forearm   Unable to visualize  7. Wrist   Unable to visualize                  Basilic  Segment Diameter Depth Comment  1. Axilla     2. Mid upper arm 4.38mm 25.58mm   3. Above [facility] 3.5mm 26.52mm   4. In [facility] 3.89mm 8.34mm   5. Below AC   Unable to visualize  6. Mid forearm   Unable to visualize  7. Wrist   Unable to visualize                   Left Upper Extremity Vein Map    Cephalic  Segment Diameter Depth Comment  1. Axilla 0.110mm    2. Mid upper arm   Unable to visualize  3. Above [facility]   Unable to visualize  4. In [facility]   Unable to visualize  5. Below AC   Unable to visualize  6. Mid forearm 1.57mm 2.30mm   7. Wrist 1.37mm 1.64mm                   Basilic  Segment Diameter Depth Comment  1. Axilla 3.17mm 8.52mm   2. Mid upper arm 3.55mm 7.41mm   3. Above AC 3.70mm 6.80mm   4. In [facility] 3.81mm 2.13mm Multiple Branches  5. Below AC   Unable to visualize  6. Mid forearm   Unable to visualize  7. Wrist   Unable to visualize                  08/05/2016 4:15 PM Maudry Mayhew, BS, RVT, RDCS, RDMS

## 2016-08-06 DIAGNOSIS — N185 Chronic kidney disease, stage 5: Secondary | ICD-10-CM

## 2016-08-06 LAB — BASIC METABOLIC PANEL
Anion gap: 11 (ref 5–15)
BUN: 69 mg/dL — ABNORMAL HIGH (ref 6–20)
CO2: 17 mmol/L — ABNORMAL LOW (ref 22–32)
Calcium: 9.2 mg/dL (ref 8.9–10.3)
Chloride: 111 mmol/L (ref 101–111)
Creatinine, Ser: 12.08 mg/dL — ABNORMAL HIGH (ref 0.61–1.24)
GFR calc Af Amer: 5 mL/min — ABNORMAL LOW (ref >60)
GFR calc non Af Amer: 4 mL/min — ABNORMAL LOW (ref >60)
Glucose, Bld: 98 mg/dL (ref 65–99)
Potassium: 5 mmol/L (ref 3.5–5.1)
Sodium: 139 mmol/L (ref 135–145)

## 2016-08-06 LAB — CBC WITH DIFFERENTIAL/PLATELET
Basophils Absolute: 0 10*3/uL (ref 0.0–0.1)
Basophils Relative: 0 %
Eosinophils Absolute: 0.8 10*3/uL — ABNORMAL HIGH (ref 0.0–0.7)
Eosinophils Relative: 12 %
HCT: 22.9 % — ABNORMAL LOW (ref 39.0–52.0)
Hemoglobin: 7.5 g/dL — ABNORMAL LOW (ref 13.0–17.0)
Lymphocytes Relative: 18 %
Lymphs Abs: 1.2 10*3/uL (ref 0.7–4.0)
MCH: 30.4 pg (ref 26.0–34.0)
MCHC: 32.8 g/dL (ref 30.0–36.0)
MCV: 92.7 fL (ref 78.0–100.0)
Monocytes Absolute: 0.7 10*3/uL (ref 0.1–1.0)
Monocytes Relative: 10 %
Neutro Abs: 3.9 10*3/uL (ref 1.7–7.7)
Neutrophils Relative %: 60 %
Platelets: 162 10*3/uL (ref 150–400)
RBC: 2.47 MIL/uL — ABNORMAL LOW (ref 4.22–5.81)
RDW: 16.4 % — ABNORMAL HIGH (ref 11.5–15.5)
WBC: 6.6 10*3/uL (ref 4.0–10.5)

## 2016-08-06 LAB — PARATHYROID HORMONE, INTACT (NO CA): PTH: 455 pg/mL — ABNORMAL HIGH (ref 15–65)

## 2016-08-06 MED ORDER — CALCITRIOL 0.25 MCG PO CAPS
0.2500 ug | ORAL_CAPSULE | Freq: Every day | ORAL | Status: DC
  Administered 2016-08-06 – 2016-08-12 (×7): 0.25 ug via ORAL
  Filled 2016-08-06 (×7): qty 1

## 2016-08-06 MED ORDER — DEXTROSE 5 % IV SOLN
1.5000 g | INTRAVENOUS | Status: AC
  Administered 2016-08-07: 1.5 g via INTRAVENOUS
  Filled 2016-08-06 (×2): qty 1.5

## 2016-08-06 NOTE — Progress Notes (Signed)
GASTROENTEROLOGY PROGRESS NOTE  Problem:   Heme positive stool and iron deficiency anemia  Subjective: He is hungry and would like to eat.  Objective: Today, his creatinine level was essentially the same as the day before, whereas previously, there had been daily improvement. Hemoglobin is slightly lower today, but probably within normal lab variation.  Assessment: End stage renal disease with unexplained iron deficiency anemia and heme positivity.  Plan: 1. Spoke with Dr. Hollie Salk today, who feels that patient will need dialysis. 2. Because the patient will be getting dialysis initiated on this admission, and given the absence of any active bleeding, I feel it is more prudent to defer his endoscopy and colonoscopy until he is metabolically under better control via dialysis, and his blood pressure is better controlled. Therefore, we will plan to follow the patient at a distance and will probably recheck him over the weekend, with the anticipation of arranging endoscopy and colonoscopy for early next week. Please call us in the meantime if needed.  Cleotis Nipper, M.D. 08/06/2016 8:30 PM  Pager [phone] If no answer or after 5 PM call [phone]

## 2016-08-06 NOTE — Consult Note (Signed)
[facility] [place]  Patient name: Jordan [name] MRN: [id number] DOB: [birth year] Sex: male  REASON FOR CONSULT: TDC and permanent access, consult is from Dr. Hollie Salk.  HPI: Jordan [name] is a 50 y.o. male who presents for permanent dialysis access and tunneled dialysis catheter placement. He is not yet on hemodialysis but will be starting soon. He has never had access procedures before. He is right handed. He presented to the [facility] ED with progressive shortness of breath. He has a history of renal insufficiency, tobacco abuse and alcohol abuse. He was also found to have a hemoglobin of 5.1 on admission. He had heme positive stools and iron deficiency anemia. GI is planning on endoscopy and colonoscopy early next week after dialysis is initiated.   He is a current every day smoker.   Past Medical History:  Diagnosis Date  . CKD (chronic kidney disease)   . ETOH abuse     Family History  Problem Relation Age of Onset  . Diabetes Mother   . Kidney failure Father   . Kidney failure Brother     SOCIAL HISTORY: Social History   Social History  . Marital status: Single    Spouse name: N/A  . Number of children: N/A  . Years of education: N/A   Occupational History  . Not on file.   Social History Main Topics  . Smoking status: Current Every Day Smoker    Packs/day: 0.50    Years: 15.00    Types: Cigarettes  . Smokeless tobacco: Never Used  . Alcohol use 12.6 oz/week    21 Cans of beer per week  . Drug use: Unknown  . Sexual activity: Not on file   Other Topics Concern  . Not on file   Social History Narrative  . No narrative on file    No Known Allergies  Current Facility-Administered Medications  Medication Dose Route Frequency Provider Last Rate Last Dose  . 0.9 %  sodium chloride infusion   Intravenous Continuous Norval Morton, MD 10 mL/hr at 08/04/16 0826    . acetaminophen (TYLENOL) tablet 650 mg  650 mg Oral Q6H PRN Rondel Jumbo, PA-C   650 mg at 08/06/16 1125   Or  . acetaminophen (TYLENOL) suppository 650 mg  650 mg Rectal Q6H PRN Rondel Jumbo, PA-C      . albuterol (PROVENTIL) (2.5 MG/3ML) 0.083% nebulizer solution 2.5 mg  2.5 mg Nebulization Q4H PRN Waldemar Dickens, MD   2.5 mg at 08/04/16 2059  . amLODipine (NORVASC) tablet 10 mg  10 mg Oral Daily Shon Millet, DO   10 mg at 08/06/16 0909  . bisacodyl (DULCOLAX) suppository 10 mg  10 mg Rectal Daily PRN Rondel Jumbo, PA-C      . calcitRIOL (ROCALTROL) capsule 0.25 mcg  0.25 mcg Oral Daily Madelon Lips, MD   0.25 mcg at 08/06/16 1638  . carvedilol (COREG) tablet 6.25 mg  6.25 mg Oral BID WC Madelon Lips, MD   6.25 mg at 08/06/16 1638  . [START ON 08/07/2016] cefUROXime (ZINACEF) 1.5 g in dextrose 5 % 50 mL IVPB  1.5 g Intravenous To SS-Surg Alvia Grove, PA-C      . Darbepoetin Alfa (ARANESP) injection 40 mcg  40 mcg Subcutaneous Q Tue-1800 Madelon Lips, MD   40 mcg at 08/05/16 1704  . folic acid (FOLVITE) tablet 1 mg  1 mg Oral Daily Rondel Jumbo, PA-C   1  mg at 08/06/16 0908  . hydrALAZINE (APRESOLINE) injection 10 mg  10 mg Intravenous Q6H PRN Gardiner Barefoot, NP   10 mg at 08/05/16 0558  . Influenza vac split quadrivalent PF (FLUARIX) injection 0.5 mL  0.5 mL Intramuscular Prior to discharge Shon Millet, DO      . labetalol (NORMODYNE,TRANDATE) injection 10 mg  10 mg Intravenous Q10 min PRN Shon Millet, DO      . LORazepam (ATIVAN) tablet 1 mg  1 mg Oral Q6H PRN Rondel Jumbo, PA-C       Or  . LORazepam (ATIVAN) injection 1 mg  1 mg Intravenous Q6H PRN Rondel Jumbo, PA-C      . multivitamin with minerals tablet 1 tablet  1 tablet Oral Daily Rondel Jumbo, PA-C   1 tablet at 08/06/16 0908  . nicotine (NICODERM CQ - dosed in mg/24 hours) patch 14 mg  14 mg Transdermal Daily Rondel Jumbo, PA-C   14 mg at 08/05/16 0834  . ondansetron (ZOFRAN) tablet 4 mg  4 mg Oral Q6H PRN Rondel Jumbo, PA-C        Or  . ondansetron [facility]) injection 4 mg  4 mg Intravenous Q6H PRN Rondel Jumbo, PA-C      . pneumococcal 23 valent vaccine (PNU-IMMUNE) injection 0.5 mL  0.5 mL Intramuscular Prior to discharge Shon Millet, DO      . polyethylene glycol (MIRALAX / GLYCOLAX) packet 17 g  17 g Oral BID Ronald Lobo, MD   17 g at 08/06/16 0908  . senna-docusate (Senokot-S) tablet 1 tablet  1 tablet Oral QHS PRN Rondel Jumbo, PA-C      . sodium chloride (OCEAN) 0.65 % nasal spray 1 spray  1 spray Each Nare PRN Waldemar Dickens, MD   1 spray at 08/05/16 [phone]  . sodium chloride flush (NS) 0.9 % injection 3 mL  3 mL Intravenous Q12H Rondel Jumbo, PA-C   3 mL at 08/06/16 0910  . thiamine (VITAMIN B-1) tablet 100 mg  100 mg Oral Daily Rondel Jumbo, PA-C   100 mg at 08/06/16 7371   Or  . thiamine (B-1) injection 100 mg  100 mg Intravenous Daily Rondel Jumbo, PA-C      . traZODone (DESYREL) tablet 25 mg  25 mg Oral QHS PRN Rondel Jumbo, PA-C        REVIEW OF SYSTEMS:  [X]  denotes positive finding, [ ]  denotes negative finding Cardiac  Comments:  Chest pain or chest pressure:    Shortness of breath upon exertion:    Short of breath when lying flat:    Irregular heart rhythm:        Vascular    Pain in calf, thigh, or hip brought on by ambulation:    Pain in feet at night that wakes you up from your sleep:     Blood clot in your veins:    Leg swelling:         Pulmonary    Oxygen at home:    Productive cough:     Wheezing:         Neurologic    Sudden weakness in arms or legs:     Sudden numbness in arms or legs:     Sudden onset of difficulty speaking or slurred speech:    Temporary loss of vision in one eye:     Problems with dizziness:  Gastrointestinal    Blood in stool:     Vomited blood:         Genitourinary    Burning when urinating:     Blood in urine:        Psychiatric    Major depression:         Hematologic    Bleeding problems:    Problems  with blood clotting too easily:        Skin    Rashes or ulcers:        Constitutional    Fever or chills:      PHYSICAL EXAM: Vitals:   08/05/16 1800 08/06/16 0001 08/06/16 0609 08/06/16 1420  BP: (!) 154/96 (!) 148/90 (!) 153/98 131/83  Pulse: 92 93 90 79  Resp:  17 17   Temp: 98.6 F (37 C) 98.7 F (37.1 C) 98.3 F (36.8 C) 98.1 F (36.7 C)  TempSrc: Oral Oral Oral Oral  SpO2: 100% 100% 100% 100%  Weight:      Height:        GENERAL: The patient is a well-nourished male, in no acute distress. The vital signs are documented above. VASCULAR: Palpable left radial and brachial pulse.  PULMONARY: Non labored respiratory effort.  MUSCULOSKELETAL: There are no major deformities or cyanosis. NEUROLOGIC: No focal weakness or paresthesias are detected. SKIN: There are no ulcers or rashes noted. PSYCHIATRIC: The patient has a normal affect.   MEDICAL ISSUES: New ESRD  Based on vein mapping, the patient has an adequate left basilic vein. He is right handed. Plan for left basilic vein transposition and insertion of tunneled dialysis catheter tomorrow with Dr. Scot Dock. The patient understands that basilic vein fistulas can be staged procedures. We will determine whether his surgery will be one stage versus two based on size of vein intra-op. NPO past midnight.    Virgina Jock, PA-C [facility] [place]    I agree with the above.  I have seen and examined the patient.  Based on vein mapping review and the fact that he is right handed, I have recommended a left arm BVT, either single stage or 2 stage procedure.  We discussed the risk of steal and non-maturity.  He will also need a catheter.  The left arm IV will need to be removed.  He is scheduled for tomorrow.  Annamarie Major

## 2016-08-06 NOTE — Progress Notes (Signed)
PROGRESS NOTE        PATIENT DETAILS Name: Jordan [name] Age: 50 y.o. Sex: male Date of Birth: 02-03-1966 Admit Date: 08/04/2016 Admitting Physician Waldemar Dickens, MD PCP:No PCP Per Patient  Brief Narrative: Patient is a 50 y.o. male who has not seen a PCP in the past several years presented with shortness of breath, was found to have hemoglobin of 5.1 and a creatinine of 14.4. Patient was subsequently admitted to the hospitalist service for further evaluation and treatment.  Subjective: Lying comfortably in bed-denies chest pain or shortness of breath. Feels overall much better than on initial presentation.  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 Extensive workup including HIV, hep B/C, RPR negative. A1c only at 5.3 SPEP without any M spike. No hydronephrosis seen on renal ultrasound. Creatinine slowly down trending, but suspect will require hemodialysis in the near future-although he is not uremic at this time. Await further recommendations from nephrology.  Anemia: No overt GI loss by history-although FOBT positive-GI following and contemplating EGD/colonoscopy before discharge. 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 Transfuse 2 units of PRBC on 11/13, also started on iron supplementation-hemoglobin stable for now. Follow  Hypertension: Better controlled, continue amlodipine and Coreg. Follow and optimize accordingly  Alcohol abuse: No signs of withdrawal at present, continue with Ativan per protocol  Tobacco abuse: Counseled, continue transdermal nicotine   DVT Prophylaxis:  SCD's-given concern for occult GI bleed  Code Status: Full code   Family  Communication: None at bedside  Disposition Plan: Remain inpatient-requires several more days of hospitalization  Antimicrobial agents: None  Procedures: None  CONSULTS:  GI and nephrology  Time spent: 25 minutes-Greater than 50% of this time was spent in counseling, explanation of diagnosis, planning of further management, and coordination of care.  MEDICATIONS: Anti-infectives    None      Scheduled Meds: . amLODipine  10 mg Oral Daily  . carvedilol  6.25 mg Oral BID WC  . darbepoetin (ARANESP) injection - NON-DIALYSIS  40 mcg Subcutaneous Q Tue-1800  . folic acid  1 mg Oral Daily  . multivitamin with minerals  1 tablet Oral Daily  . nicotine  14 mg Transdermal Daily  . polyethylene glycol  17 g Oral BID  . sodium chloride flush  3 mL Intravenous Q12H  . thiamine  100 mg Oral Daily   Or  . thiamine  100 mg Intravenous Daily   Continuous Infusions: . sodium chloride 10 mL/hr at 08/04/16 0826   PRN Meds:.acetaminophen **OR** acetaminophen, albuterol, bisacodyl, hydrALAZINE, Influenza vac split quadrivalent PF, labetalol, LORazepam **OR** LORazepam, ondansetron **OR** ondansetron (ZOFRAN) IV, pneumococcal 23 valent vaccine, senna-docusate, sodium chloride, traZODone   PHYSICAL EXAM: Vital signs: Vitals:   08/05/16 1318 08/05/16 1800 08/06/16 0001 08/06/16 0609  BP: (!) 168/84 (!) 154/96 (!) 148/90 (!) 153/98  Pulse: (!) 115 92 93 90  Resp: 18  17 17   Temp: 99.5 F (37.5 C) 98.6 F (37 C) 98.7 F (37.1 C) 98.3 F (36.8 C)  TempSrc: Oral Oral Oral Oral  SpO2: 100% 100% 100% 100%  Weight:      Height:       [employer]  08/04/16 0239 08/05/16 0557  Weight: 74.8 kg (165 lb) 75 kg (165 lb 5.5 oz)   Body mass index is 28.38 kg/m.   General appearance :Awake, alert, not in any distress. Speech Clear. Not toxic Looking Eyes:, pupils equally reactive to light and accomodation,no scleral icterus.Pink conjunctiva HEENT: Atraumatic and Normocephalic Neck:  supple, no JVD. No cervical lymphadenopathy. No thyromegaly Resp:Good air entry bilaterally, no added sounds  CVS: S1 S2 regular, no murmurs.  GI: Bowel sounds present, Non tender and not distended with no gaurding, rigidity or rebound.No organomegaly Extremities: B/L Lower Ext shows no edema, both legs are warm to touch Neurology:  speech clear,Non focal, sensation is grossly intact. Psychiatric: Normal judgment and insight. Alert and oriented x 3. Normal mood. Musculoskeletal:.No digital cyanosis Skin:No Rash, warm and dry Wounds:N/A  I have personally reviewed following labs and imaging studies  LABORATORY DATA: CBC:  Recent Labs Lab 08/04/16 0253 08/04/16 0305 08/04/16 1423 08/05/16 0609 08/06/16 0706  WBC 8.0  --   --  9.2 6.6  NEUTROABS 5.6  --   --   --  3.9  HGB 5.3* 5.1* 8.3* 8.0* 7.5*  HCT 16.2* 15.0* 24.9* 23.7* 22.9*  MCV 96.4  --   --  91.5 92.7  PLT 158  --   --  166 785    Basic Metabolic Panel:  Recent Labs Lab 08/04/16 0305 08/05/16 0609 08/05/16 1234 08/06/16 0706  NA 140 141  --  139  K 4.6 4.8  --  5.0  CL 113* 115*  --  111  CO2  --  16*  --  17*  GLUCOSE 92 92  --  98  BUN 88* 69*  --  69*  CREATININE 14.40* 11.98*  --  12.08*  CALCIUM  --  9.4  --  9.2  PHOS  --   --  4.9*  --     GFR: Estimated Creatinine Clearance: 6.9 mL/min (by C-G formula based on SCr of 12.08 mg/dL (H)).  Liver Function Tests:  Recent Labs Lab 08/04/16 0253 08/05/16 0609  AST 22 15  ALT 20 16*  ALKPHOS 78 82  BILITOT 0.4 0.9  PROT 6.4* 6.6  ALBUMIN 3.2* 3.1*   No results for input(s): LIPASE, AMYLASE in the last 168 hours. No results for input(s): AMMONIA in the last 168 hours.  Coagulation Profile: No results for input(s): INR, PROTIME in the last 168 hours.  Cardiac Enzymes: No results for input(s): CKTOTAL, CKMB, CKMBINDEX, TROPONINI in the last 168 hours.  BNP (last 3 results) No results for input(s): PROBNP in the last 8760  hours.  HbA1C:  Recent Labs  08/04/16 1423  HGBA1C 5.3    CBG: No results for input(s): GLUCAP in the last 168 hours.  Lipid Profile: No results for input(s): CHOL, HDL, LDLCALC, TRIG, CHOLHDL, LDLDIRECT in the last 72 hours.  Thyroid Function Tests:  Recent Labs  08/04/16 0825  TSH 1.742    Anemia Panel:  Recent Labs  08/04/16 0543  VITAMINB12 220  FOLATE 10.6  FERRITIN 16*  TIBC 423  IRON 29*  RETICCTPCT 2.3    Urine analysis:    Component Value Date/Time   COLORURINE STRAW (A) 08/04/2016 0448   APPEARANCEUR CLOUDY (A) 08/04/2016 0448   LABSPEC 1.011 08/04/2016 0448   PHURINE 5.5 08/04/2016 0448   GLUCOSEU 100 (A) 08/04/2016 0448   HGBUR SMALL (A) 08/04/2016 0448   BILIRUBINUR NEGATIVE 08/04/2016 0448   KETONESUR NEGATIVE 08/04/2016 0448   PROTEINUR >300 (  A) 08/04/2016 0448   NITRITE NEGATIVE 08/04/2016 0448   LEUKOCYTESUR NEGATIVE 08/04/2016 0448    Sepsis Labs: Lactic Acid, Venous No results found for: LATICACIDVEN  MICROBIOLOGY: Recent Results (from the past 240 hour(s))  Urine culture     Status: None   Collection Time: 08/04/16 10:17 AM  Result Value Ref Range Status   Specimen Description URINE, CLEAN CATCH  Final   Special Requests NONE  Final   Culture NO GROWTH  Final   Report Status 08/05/2016 FINAL  Final    RADIOLOGY STUDIES/RESULTS: Dg Chest 2 View  Result Date: 08/04/2016 CLINICAL DATA:  50 y/o M; shortness of breath and mid chest pain for 1 week. EXAM: CHEST  2 VIEW COMPARISON:  None. FINDINGS: Moderate bronchitic changes. No focal consolidation. No pneumothorax or effusion. Normal cardiac silhouette. Mild degenerative changes of thoracic spine. IMPRESSION: Moderate bronchitic changes.  No focal consolidation. Electronically Signed   By: Kristine Garbe M.D.   On: 08/04/2016 04:10   US Renal  Result Date: 08/04/2016 CLINICAL DATA:  Chronic renal insufficiency EXAM: RENAL / URINARY TRACT ULTRASOUND COMPLETE  COMPARISON:  None in PACs FINDINGS: Right Kidney: Length: 7.2 cm. The renal cortical echotexture is markedly increased and greater than that of the adjacent liver. There is no hydronephrosis. No discrete mass is observed. Left Kidney: Length: 9.1 cm. The renal cortical echotexture is markedly increased similar to that on the right. There is no hydronephrosis nor parenchymal mass. Bladder: The partially distended urinary bladder is normal. The prostate gland produces a mild irregular impression upon the urinary bladder base. IMPRESSION: Renal cortical atrophy and increased renal cortical echotexture consistent with medical renal disease. No hydronephrosis. Normal appearance of the urinary bladder. Electronically Signed   By: David  Martinique M.D.   On: 08/04/2016 13:25     LOS: 2 days   Oren Binet, MD  [facility] Pager:[phone]  If 7PM-7AM, please contact night-coverage [email or web address] Password [email or web address] 08/06/2016, 11:45 AM

## 2016-08-06 NOTE — Progress Notes (Signed)
Addendum to previous note: Patient would like to speak to case manager about financing his care, and would like to be started on solid food, both of which are okay from the GI tract standpoint;  I thought these would be more properly ordered by his attending hospitalist and/or nephrologist, at their discretion.  Cleotis Nipper, M.D. Pager [phone] If no answer or after 5 PM call [phone]

## 2016-08-06 NOTE — Progress Notes (Signed)
[facility] Progress Note    Assessment/ Plan:   1.  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  Hep B/C, HIV, RPR all negative.  SPEP negative; HgbA1c 5.3--> but has some glucose on Ua, adding on Kappa/ Lambda light chains.  UP/C with ~ 2 g protein.  ANA + 1:80 (weakly). Renal U/S showing very echogenic kidneys without hydro.  Creatinine is 12 and seems to have plateaued.  I was hopeful he would have more improvement; he is essentially ESRD (eGFR 4)  and should begin dialysis. Has had vein mapping, will consult [facility] for fistula and TDC placement.    2.  Symptomatic anemia: Tsat 7, getting ferraheme and 1u pRBCs.  Chronic component but some is likely acute based on history of dark stools.  starting Aranesp 40 q week.  Hgb goal 10-11.    3.  Dark tarry stools: GI consult, planning for scopes closer to d/c and until after HD initiated.  4.  HTN: amlodipine 10 mg daily added today, also adding Coreg 6.25 mg BID.  Will uptitrate.  No ACEi/ARB. BP improving.   4.  EtOH abuse: no tremor today, has Ativan if needed  5.  Tobacco abuse: nicotine patch.  6.  Bone/mineral: PTH 455, Phos 4.9, no binder needed now, starting Calcitriol 0.25 mcg daily.  Vit D pending  Subjective:     Feeling well.  BP s are getting better.  Creatinine has plateaued at 12.   Objective:   BP (!) 153/98 (BP Location: Left Arm)   Pulse 90   Temp 98.3 F (36.8 C) (Oral)   Resp 17   Ht _0  (1.626 m)   Wt 75 kg (165 lb 5.5 oz)   SpO2 100%   BMI 28.38 kg/m   Intake/Output Summary (Last 24 hours) at 08/06/16 1258 Last data filed at 08/06/16 0913  Gross per 24 hour  Intake              720 ml  Output              225 ml  Net              495 ml   Weight change:   Physical Exam: GEN: NAD, standing up HEENT: EOMI, PERRL, sclerae muddy NECK:  Supple, no JVD or bruit PULM: CTAB no c/w/r CV : tachcardic, II/VI systolic murmur, no r/g ABD: soft, nondistended, nontender, +liver edge 3 cm below costal margin.  Spleen not appreciated EXT: no LE edema NEURO: AAO x 3, no asterixis, no tremor MSK: no synovitis SKIN: no rashes or lesions, mult tattoos  Imaging: US Renal  Result Date: 08/04/2016 CLINICAL DATA:  Chronic renal insufficiency EXAM: RENAL / URINARY TRACT ULTRASOUND COMPLETE COMPARISON:  None in PACs FINDINGS: Right Kidney: Length: 7.2 cm. The renal cortical echotexture is markedly increased and greater than that of the adjacent liver. There is no hydronephrosis. No discrete mass is observed. Left Kidney: Length: 9.1 cm. The renal cortical echotexture is markedly increased similar to that on the right. There is no hydronephrosis nor parenchymal mass. Bladder: The partially distended urinary bladder is normal. The prostate gland produces a mild irregular impression upon the urinary bladder base. IMPRESSION: Renal cortical atrophy and increased renal cortical echotexture consistent with medical renal disease. No hydronephrosis. Normal appearance of the urinary bladder. Electronically Signed   By: David  Martinique M.D.  On: 08/04/2016 13:25    Labs: BMET  Recent Labs Lab 08/04/16 0305 08/05/16 0609 08/05/16 1234 08/06/16 0706  NA 140 141  --  139  K 4.6 4.8  --  5.0  CL 113* 115*  --  111  CO2  --  16*  --  17*  GLUCOSE 92 92  --  98  BUN 88* 69*  --  69*  CREATININE 14.40* 11.98*  --  12.08*  CALCIUM  --  9.4  --  9.2  PHOS  --   --  4.9*  --    CBC  Recent Labs Lab 08/04/16 0253 08/04/16 0305 08/04/16 1423 08/05/16 0609 08/06/16 0706  WBC 8.0  --   --  9.2 6.6  NEUTROABS 5.6  --   --   --  3.9  HGB 5.3* 5.1* 8.3* 8.0* 7.5*  HCT 16.2* 15.0* 24.9* 23.7* 22.9*  MCV 96.4  --   --  91.5 92.7  PLT 158  --   --  166 162    Medications:    . amLODipine  10 mg Oral Daily  . carvedilol  6.25 mg Oral BID WC  .  darbepoetin (ARANESP) injection - NON-DIALYSIS  40 mcg Subcutaneous Q Tue-1800  . folic acid  1 mg Oral Daily  . multivitamin with minerals  1 tablet Oral Daily  . nicotine  14 mg Transdermal Daily  . polyethylene glycol  17 g Oral BID  . sodium chloride flush  3 mL Intravenous Q12H  . thiamine  100 mg Oral Daily   Or  . thiamine  100 mg Intravenous Daily     Madelon Lips MD [facility] Cell [phone] pgr [phone] 08/06/2016, 12:58 PM

## 2016-08-07 ENCOUNTER — Inpatient Hospital Stay (HOSPITAL_COMMUNITY): Payer: Medicaid Other | Admitting: Certified Registered Nurse Anesthetist

## 2016-08-07 ENCOUNTER — Encounter (HOSPITAL_COMMUNITY): Payer: Self-pay | Admitting: Certified Registered Nurse Anesthetist

## 2016-08-07 ENCOUNTER — Inpatient Hospital Stay (HOSPITAL_COMMUNITY): Payer: Medicaid Other

## 2016-08-07 ENCOUNTER — Encounter (HOSPITAL_COMMUNITY)
Admission: EM | Disposition: A | Discharge status: Home / Self Care | Payer: Self-pay | Source: Home / Self Care | Attending: Internal Medicine

## 2016-08-07 DIAGNOSIS — I10 Essential (primary) hypertension: Secondary | ICD-10-CM

## 2016-08-07 HISTORY — PX: BASCILIC VEIN TRANSPOSITION: SHX5742

## 2016-08-07 HISTORY — PX: INSERTION OF DIALYSIS CATHETER: SHX1324

## 2016-08-07 LAB — CBC WITH DIFFERENTIAL/PLATELET
Basophils Absolute: 0 10*3/uL (ref 0.0–0.1)
Basophils Relative: 0 %
Eosinophils Absolute: 1 10*3/uL — ABNORMAL HIGH (ref 0.0–0.7)
Eosinophils Relative: 12 %
HCT: 23.2 % — ABNORMAL LOW (ref 39.0–52.0)
Hemoglobin: 7.6 g/dL — ABNORMAL LOW (ref 13.0–17.0)
Lymphocytes Relative: 16 %
Lymphs Abs: 1.2 10*3/uL (ref 0.7–4.0)
MCH: 30.4 pg (ref 26.0–34.0)
MCHC: 32.8 g/dL (ref 30.0–36.0)
MCV: 92.8 fL (ref 78.0–100.0)
Monocytes Absolute: 0.7 10*3/uL (ref 0.1–1.0)
Monocytes Relative: 9 %
Neutro Abs: 4.9 10*3/uL (ref 1.7–7.7)
Neutrophils Relative %: 63 %
Platelets: 179 10*3/uL (ref 150–400)
RBC: 2.5 MIL/uL — ABNORMAL LOW (ref 4.22–5.81)
RDW: 16 % — ABNORMAL HIGH (ref 11.5–15.5)
WBC: 7.8 10*3/uL (ref 4.0–10.5)

## 2016-08-07 LAB — PREPARE RBC (CROSSMATCH)

## 2016-08-07 LAB — BASIC METABOLIC PANEL
Anion gap: 11 (ref 5–15)
BUN: 67 mg/dL — ABNORMAL HIGH (ref 6–20)
CO2: 16 mmol/L — ABNORMAL LOW (ref 22–32)
Calcium: 9.2 mg/dL (ref 8.9–10.3)
Chloride: 112 mmol/L — ABNORMAL HIGH (ref 101–111)
Creatinine, Ser: 11.85 mg/dL — ABNORMAL HIGH (ref 0.61–1.24)
GFR calc Af Amer: 5 mL/min — ABNORMAL LOW (ref >60)
GFR calc non Af Amer: 4 mL/min — ABNORMAL LOW (ref >60)
Glucose, Bld: 85 mg/dL (ref 65–99)
Potassium: 5 mmol/L (ref 3.5–5.1)
Sodium: 139 mmol/L (ref 135–145)

## 2016-08-07 LAB — EXPECTORATED SPUTUM ASSESSMENT W REFEX TO RESP CULTURE

## 2016-08-07 LAB — EXPECTORATED SPUTUM ASSESSMENT W GRAM STAIN, RFLX TO RESP C

## 2016-08-07 IMAGING — CR DG CHEST 1V PORT
1 series · 1 of 1 positions shown · non-contrast
Comparison: [DATE].

CLINICAL DATA: Status post dialysis catheter insertion.

EXAM:
PORTABLE CHEST 1 VIEW

[AP]
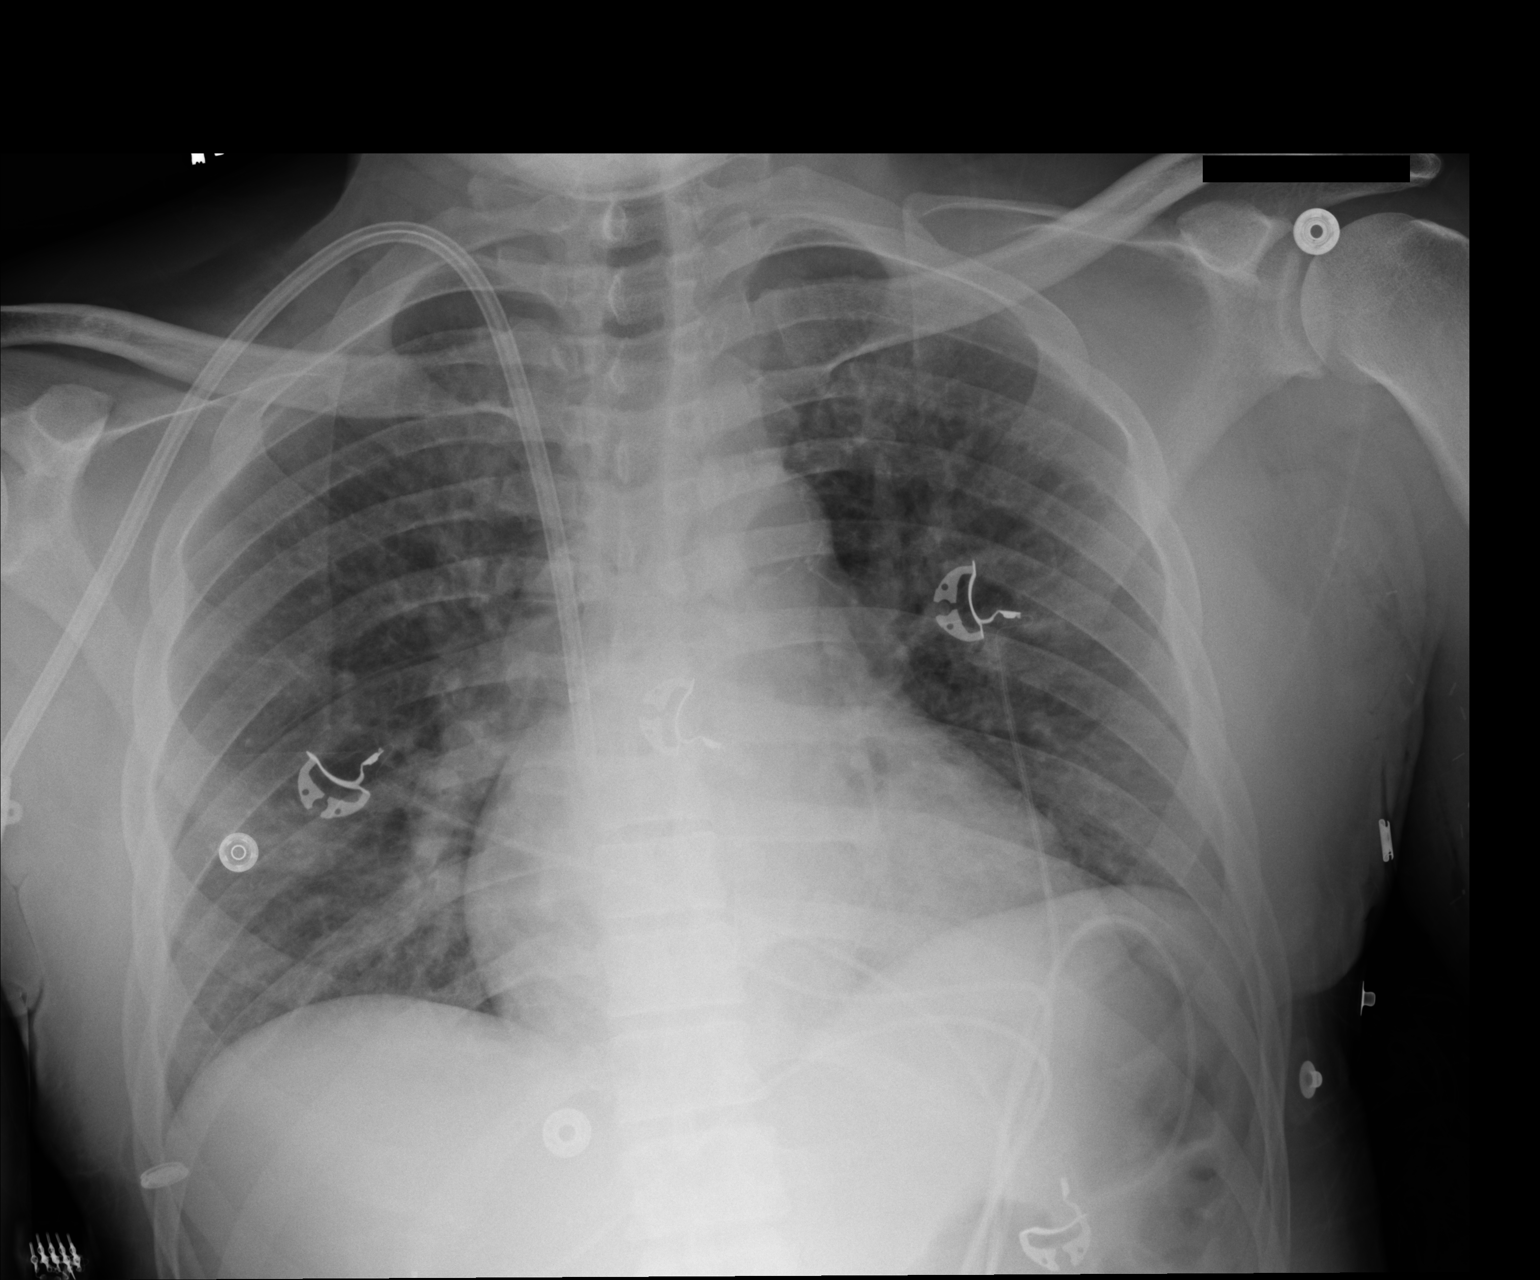

[1 of 1 positions shown; findings below may reference images not displayed]

FINDINGS: Interval right jugular catheter with its tip at the superior
cavoatrial junction. No pneumothorax. Borderline enlarged cardiac
silhouette with prominent pulmonary vasculature. Clear lungs.
Unremarkable bones.
IMPRESSION: 1. Right jugular catheter tip at the superior cavoatrial junction
without pneumothorax.
2. Pulmonary vascular congestion and borderline cardiomegaly.

## 2016-08-07 SURGERY — TRANSPOSITION, VEIN, BASILIC
Anesthesia: General | Site: Chest

## 2016-08-07 MED ORDER — LIDOCAINE-PRILOCAINE 2.5-2.5 % EX CREA
1.0000 "application " | TOPICAL_CREAM | CUTANEOUS | Status: DC | PRN
  Filled 2016-08-07: qty 5

## 2016-08-07 MED ORDER — HEPARIN SODIUM (PORCINE) 1000 UNIT/ML IJ SOLN
INTRAMUSCULAR | Status: AC
  Filled 2016-08-07: qty 1

## 2016-08-07 MED ORDER — SUCCINYLCHOLINE CHLORIDE 200 MG/10ML IV SOSY
PREFILLED_SYRINGE | INTRAVENOUS | Status: AC
  Filled 2016-08-07: qty 10

## 2016-08-07 MED ORDER — LIDOCAINE HCL (PF) 1 % IJ SOLN
5.0000 mL | INTRAMUSCULAR | Status: DC | PRN
  Filled 2016-08-07: qty 5

## 2016-08-07 MED ORDER — FENTANYL CITRATE (PF) 100 MCG/2ML IJ SOLN
INTRAMUSCULAR | Status: AC
  Filled 2016-08-07: qty 2

## 2016-08-07 MED ORDER — OXYCODONE-ACETAMINOPHEN 5-325 MG PO TABS
1.0000 | ORAL_TABLET | ORAL | Status: DC | PRN
  Administered 2016-08-07 – 2016-08-08 (×2): 2 via ORAL
  Filled 2016-08-07 (×2): qty 2

## 2016-08-07 MED ORDER — HEPARIN SODIUM (PORCINE) 1000 UNIT/ML IJ SOLN
INTRAMUSCULAR | Status: DC | PRN
  Administered 2016-08-07: 5000 [IU] via INTRAVENOUS

## 2016-08-07 MED ORDER — FENTANYL CITRATE (PF) 100 MCG/2ML IJ SOLN
INTRAMUSCULAR | Status: DC | PRN
  Administered 2016-08-07 (×2): 100 ug via INTRAVENOUS

## 2016-08-07 MED ORDER — GLYCOPYRROLATE 0.2 MG/ML IV SOSY
PREFILLED_SYRINGE | INTRAVENOUS | Status: AC
  Filled 2016-08-07: qty 3

## 2016-08-07 MED ORDER — ALBUMIN HUMAN 5 % IV SOLN
INTRAVENOUS | Status: DC | PRN
  Administered 2016-08-07 (×2): via INTRAVENOUS

## 2016-08-07 MED ORDER — ATROPINE SULFATE 1 MG/ML IJ SOLN
INTRAMUSCULAR | Status: AC
  Filled 2016-08-07: qty 1

## 2016-08-07 MED ORDER — PHENYLEPHRINE 40 MCG/ML (10ML) SYRINGE FOR IV PUSH (FOR BLOOD PRESSURE SUPPORT)
PREFILLED_SYRINGE | INTRAVENOUS | Status: AC
  Filled 2016-08-07: qty 20

## 2016-08-07 MED ORDER — IOPAMIDOL (ISOVUE-300) INJECTION 61%
INTRAVENOUS | Status: AC
  Filled 2016-08-07: qty 50

## 2016-08-07 MED ORDER — PHENYLEPHRINE HCL 10 MG/ML IJ SOLN
INTRAVENOUS | Status: DC | PRN
  Administered 2016-08-07: 50 ug/min via INTRAVENOUS

## 2016-08-07 MED ORDER — HEPARIN SODIUM (PORCINE) 1000 UNIT/ML IJ SOLN
INTRAMUSCULAR | Status: DC | PRN
  Administered 2016-08-07: 1.7 mL via INTRAVENOUS

## 2016-08-07 MED ORDER — EPHEDRINE SULFATE 50 MG/ML IJ SOLN
INTRAMUSCULAR | Status: DC | PRN
  Administered 2016-08-07 (×2): 15 mg via INTRAVENOUS
  Administered 2016-08-07: 5 mg via INTRAVENOUS
  Administered 2016-08-07: 15 mg via INTRAVENOUS

## 2016-08-07 MED ORDER — THROMBIN 20000 UNITS EX SOLR
CUTANEOUS | Status: AC
  Filled 2016-08-07: qty 20000

## 2016-08-07 MED ORDER — SODIUM CHLORIDE 0.9 % IV SOLN: 100.0000 mL | INTRAVENOUS | Status: DC | PRN

## 2016-08-07 MED ORDER — PROPOFOL 10 MG/ML IV BOLUS
INTRAVENOUS | Status: AC
  Filled 2016-08-07: qty 20

## 2016-08-07 MED ORDER — LIDOCAINE 2% (20 MG/ML) 5 ML SYRINGE
INTRAMUSCULAR | Status: DC | PRN
  Administered 2016-08-07: 60 mg via INTRAVENOUS

## 2016-08-07 MED ORDER — PENTAFLUOROPROP-TETRAFLUOROETH EX AERO
1.0000 "application " | INHALATION_SPRAY | CUTANEOUS | Status: DC | PRN
  Filled 2016-08-07: qty 30

## 2016-08-07 MED ORDER — ALTEPLASE 2 MG IJ SOLR
2.0000 mg | Freq: Once | INTRAMUSCULAR | Status: DC | PRN
  Filled 2016-08-07: qty 2

## 2016-08-07 MED ORDER — NEOSTIGMINE METHYLSULFATE 5 MG/5ML IV SOSY
PREFILLED_SYRINGE | INTRAVENOUS | Status: AC
Start: 2016-08-07 — End: 2016-08-07
  Filled 2016-08-07: qty 5

## 2016-08-07 MED ORDER — MIDAZOLAM HCL 2 MG/2ML IJ SOLN
INTRAMUSCULAR | Status: AC
  Filled 2016-08-07: qty 2

## 2016-08-07 MED ORDER — SODIUM CHLORIDE 0.9 % IV SOLN: INTRAVENOUS | Status: DC

## 2016-08-07 MED ORDER — PROPOFOL 10 MG/ML IV BOLUS
INTRAVENOUS | Status: DC | PRN
  Administered 2016-08-07: 130 mg via INTRAVENOUS

## 2016-08-07 MED ORDER — ONDANSETRON HCL 4 MG/2ML IJ SOLN
INTRAMUSCULAR | Status: AC
  Filled 2016-08-07: qty 2

## 2016-08-07 MED ORDER — MIDAZOLAM HCL 5 MG/5ML IJ SOLN
INTRAMUSCULAR | Status: DC | PRN
  Administered 2016-08-07: 2 mg via INTRAVENOUS

## 2016-08-07 MED ORDER — ONDANSETRON HCL 4 MG/2ML IJ SOLN
INTRAMUSCULAR | Status: DC | PRN
  Administered 2016-08-07: 4 mg via INTRAVENOUS

## 2016-08-07 MED ORDER — PROTAMINE SULFATE 10 MG/ML IV SOLN
INTRAVENOUS | Status: AC
  Filled 2016-08-07: qty 5

## 2016-08-07 MED ORDER — EPHEDRINE 5 MG/ML INJ
INTRAVENOUS | Status: AC
  Filled 2016-08-07: qty 10

## 2016-08-07 MED ORDER — LIDOCAINE 2% (20 MG/ML) 5 ML SYRINGE
INTRAMUSCULAR | Status: AC
  Filled 2016-08-07: qty 10

## 2016-08-07 MED ORDER — HYDROMORPHONE HCL 1 MG/ML IJ SOLN: 0.2500 mg | INTRAMUSCULAR | Status: DC | PRN

## 2016-08-07 MED ORDER — LIDOCAINE HCL (PF) 1 % IJ SOLN
INTRAMUSCULAR | Status: DC | PRN
  Administered 2016-08-07: 18 mL

## 2016-08-07 MED ORDER — LIDOCAINE HCL (PF) 1 % IJ SOLN
INTRAMUSCULAR | Status: AC
  Filled 2016-08-07: qty 30

## 2016-08-07 MED ORDER — PHENYLEPHRINE HCL 10 MG/ML IJ SOLN
INTRAMUSCULAR | Status: DC | PRN
  Administered 2016-08-07: 80 ug via INTRAVENOUS
  Administered 2016-08-07: 120 ug via INTRAVENOUS
  Administered 2016-08-07 (×2): 80 ug via INTRAVENOUS
  Administered 2016-08-07 (×4): 120 ug via INTRAVENOUS

## 2016-08-07 MED ORDER — EPHEDRINE 5 MG/ML INJ
INTRAVENOUS | Status: AC
  Filled 2016-08-07: qty 20

## 2016-08-07 MED ORDER — ONDANSETRON HCL 4 MG/2ML IJ SOLN
INTRAMUSCULAR | Status: AC
Start: 2016-08-07 — End: 2016-08-07
  Filled 2016-08-07: qty 2

## 2016-08-07 MED ORDER — 0.9 % SODIUM CHLORIDE (POUR BTL) OPTIME
TOPICAL | Status: DC | PRN
  Administered 2016-08-07: 1000 mL

## 2016-08-07 MED ORDER — ROCURONIUM BROMIDE 10 MG/ML (PF) SYRINGE
PREFILLED_SYRINGE | INTRAVENOUS | Status: AC
  Filled 2016-08-07: qty 10

## 2016-08-07 MED ORDER — SODIUM CHLORIDE 0.9 % IV SOLN
INTRAVENOUS | Status: DC | PRN
  Administered 2016-08-07: 16:00:00

## 2016-08-07 MED ORDER — SODIUM CHLORIDE 0.9 % IV SOLN
INTRAVENOUS | Status: DC | PRN
  Administered 2016-08-07: 17:00:00 via INTRAVENOUS

## 2016-08-07 MED ORDER — PROTAMINE SULFATE 10 MG/ML IV SOLN
INTRAVENOUS | Status: DC | PRN
  Administered 2016-08-07: 10 mg via INTRAVENOUS
  Administered 2016-08-07: 20 mg via INTRAVENOUS

## 2016-08-07 MED ORDER — HEPARIN SODIUM (PORCINE) 1000 UNIT/ML DIALYSIS
1000.0000 [IU] | INTRAMUSCULAR | Status: DC | PRN
  Filled 2016-08-07: qty 1

## 2016-08-07 SURGICAL SUPPLY — 62 items
ADH SKN CLS APL DERMABOND .7 (GAUZE/BANDAGES/DRESSINGS) ×2
ADH SKN CLS LQ APL DERMABOND (GAUZE/BANDAGES/DRESSINGS) ×2
ARMBAND PINK RESTRICT EXTREMIT (MISCELLANEOUS) ×4 IMPLANT
BAG DECANTER FOR FLEXI CONT (MISCELLANEOUS) ×4 IMPLANT
BIOPATCH RED 1 DISK 7.0 (GAUZE/BANDAGES/DRESSINGS) ×3 IMPLANT
BIOPATCH RED 1IN DISK 7.0MM (GAUZE/BANDAGES/DRESSINGS) ×1
BLADE CLIPPER SURG (BLADE) ×2 IMPLANT
CANISTER SUCTION 2500CC (MISCELLANEOUS) ×4 IMPLANT
CANNULA VESSEL 3MM 2 BLNT TIP (CANNULA) IMPLANT
CATH PALINDROME RT-P 15FX23CM (CATHETERS) ×2 IMPLANT
CHLORAPREP W/TINT 26ML (MISCELLANEOUS) ×4 IMPLANT
CLIP TI MEDIUM 24 (CLIP) ×4 IMPLANT
CLIP TI WIDE RED SMALL 24 (CLIP) ×4 IMPLANT
COVER PROBE W GEL 5X96 (DRAPES) ×4 IMPLANT
COVER SURGICAL LIGHT HANDLE (MISCELLANEOUS) ×4 IMPLANT
DECANTER SPIKE VIAL GLASS SM (MISCELLANEOUS) ×4 IMPLANT
DERMABOND ADHESIVE PROPEN (GAUZE/BANDAGES/DRESSINGS) ×2
DERMABOND ADVANCED (GAUZE/BANDAGES/DRESSINGS) ×2
DERMABOND ADVANCED .7 DNX12 (GAUZE/BANDAGES/DRESSINGS) ×2 IMPLANT
DERMABOND ADVANCED .7 DNX6 (GAUZE/BANDAGES/DRESSINGS) IMPLANT
DRAIN PENROSE 1/2X12 LTX STRL (WOUND CARE) IMPLANT
DRAPE C-ARM 42X72 X-RAY (DRAPES) ×4 IMPLANT
DRAPE CHEST BREAST 15X10 FENES (DRAPES) ×4 IMPLANT
DRSG TEGADERM 4X4.75 (GAUZE/BANDAGES/DRESSINGS) ×2 IMPLANT
ELECT REM PT RETURN 9FT ADLT (ELECTROSURGICAL) ×4
ELECTRODE REM PT RTRN 9FT ADLT (ELECTROSURGICAL) ×2 IMPLANT
GAUZE SPONGE 2X2 8PLY STRL LF (GAUZE/BANDAGES/DRESSINGS) IMPLANT
GAUZE SPONGE 4X4 16PLY XRAY LF (GAUZE/BANDAGES/DRESSINGS) ×6 IMPLANT
GLOVE BIO SURGEON STRL SZ 6.5 (GLOVE) ×1 IMPLANT
GLOVE BIO SURGEON STRL SZ7.5 (GLOVE) ×4 IMPLANT
GLOVE BIO SURGEONS STRL SZ 6.5 (GLOVE) ×1
GLOVE BIOGEL PI IND STRL 7.0 (GLOVE) IMPLANT
GLOVE BIOGEL PI IND STRL 8 (GLOVE) ×2 IMPLANT
GLOVE BIOGEL PI INDICATOR 7.0 (GLOVE) ×6
GLOVE BIOGEL PI INDICATOR 8 (GLOVE) ×2
GLOVE SURG SS PI 6.5 STRL IVOR (GLOVE) ×2 IMPLANT
GOWN STRL REUS W/ TWL LRG LVL3 (GOWN DISPOSABLE) ×6 IMPLANT
GOWN STRL REUS W/TWL LRG LVL3 (GOWN DISPOSABLE) ×12
KIT BASIN OR (CUSTOM PROCEDURE TRAY) ×4 IMPLANT
KIT ROOM TURNOVER OR (KITS) ×4 IMPLANT
NDL 18GX1X1/2 (RX/OR ONLY) (NEEDLE) ×2 IMPLANT
NDL HYPO 25GX1X1/2 BEV (NEEDLE) ×2 IMPLANT
NEEDLE 18GX1X1/2 (RX/OR ONLY) (NEEDLE) ×4 IMPLANT
NEEDLE 22X1 1/2 (OR ONLY) (NEEDLE) ×4 IMPLANT
NEEDLE HYPO 25GX1X1/2 BEV (NEEDLE) ×4 IMPLANT
NS IRRIG 1000ML POUR BTL (IV SOLUTION) ×4 IMPLANT
PACK CV ACCESS (CUSTOM PROCEDURE TRAY) ×4 IMPLANT
PACK SURGICAL SETUP 50X90 (CUSTOM PROCEDURE TRAY) ×4 IMPLANT
PAD ARMBOARD 7.5X6 YLW CONV (MISCELLANEOUS) ×8 IMPLANT
SPONGE GAUZE 2X2 STER 10/PKG (GAUZE/BANDAGES/DRESSINGS)
SPONGE SURGIFOAM ABS GEL 100 (HEMOSTASIS) IMPLANT
SUT ETHILON 3 0 PS 1 (SUTURE) ×4 IMPLANT
SUT PROLENE 6 0 BV (SUTURE) ×8 IMPLANT
SUT SILK 2 0 SH (SUTURE) ×2 IMPLANT
SUT VIC AB 3-0 SH 27 (SUTURE) ×8
SUT VIC AB 3-0 SH 27X BRD (SUTURE) ×2 IMPLANT
SUT VICRYL 4-0 PS2 18IN ABS (SUTURE) ×10 IMPLANT
SYR 20CC LL (SYRINGE) ×8 IMPLANT
SYR 5ML LL (SYRINGE) ×8 IMPLANT
SYR CONTROL 10ML LL (SYRINGE) ×4 IMPLANT
SYRINGE 10CC LL (SYRINGE) ×4 IMPLANT
UNDERPAD 30X30 (UNDERPADS AND DIAPERS) ×4 IMPLANT

## 2016-08-07 NOTE — Anesthesia Procedure Notes (Addendum)
Procedure Name: LMA Insertion Date/Time: 08/07/2016 3:02 PM Performed by: Salli Quarry Clotine Heiner Pre-anesthesia Checklist: Patient identified, Emergency Drugs available, Suction available and Patient being monitored Patient Re-evaluated:Patient Re-evaluated prior to inductionOxygen Delivery Method: Circle System Utilized Preoxygenation: Pre-oxygenation with 100% oxygen Intubation Type: IV induction LMA: LMA inserted LMA Size: 4.0 Number of attempts: 1 Placement Confirmation: positive ETCO2 and breath sounds checked- equal and bilateral Tube secured with: Tape Dental Injury: Teeth and Oropharynx as per pre-operative assessment

## 2016-08-07 NOTE — Progress Notes (Signed)
[facility] Progress Note    Assessment/ Plan:   1.  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  Hep B/C, HIV, RPR all negative.  SPEP negative; HgbA1c 5.3--> but has some glucose on Ua, adding on Kappa/ Lambda light chains.  UP/C with ~ 2 g protein.  ANA + 1:80 (weakly). Renal U/S showing very echogenic kidneys without hydro.  Creatinine is 12 and seems to have plateaued.  I was hopeful he would have more improvement; he is essentially ESRD (eGFR 4)  and should begin dialysis. Has had vein mapping, going to OR today with [facility].  2.  Symptomatic anemia: Tsat 7, getting ferraheme and 1u pRBCs.  Chronic component but some is likely acute based on history of dark stools.  starting Aranesp 40 q week.  Hgb goal 10-11.    3.  Dark tarry stools: GI consult, planning for scopes closer to d/c and until after HD initiated.  4.  HTN: amlodipine 10 mg daily added today, also adding Coreg 6.25 mg BID.  Will uptitrate.  No ACEi/ARB. BP improving.   4.  EtOH abuse: no tremor today, has Ativan if needed  5.  Tobacco abuse: nicotine patch.  6.  Bone/mineral: PTH 455, Phos 4.9, no binder needed now, starting Calcitriol 0.25 mcg daily.  Vit D pending  Subjective:    Getting fistula and TDC today.    Objective:   BP 134/84   Pulse 79   Temp 99.5 F (37.5 C) (Oral)   Resp 16   Ht 5' 4"  (1.626 m)   Wt 68.6 kg (151 lb 3.2 oz)   SpO2 96%   BMI 25.95 kg/m   Intake/Output Summary (Last 24 hours) at 08/07/16 1243 Last data filed at 08/07/16 1015  Gross per 24 hour  Intake              360 ml  Output                0 ml  Net              360 ml   Weight change:   Physical Exam: GEN: NAD, standing up HEENT: EOMI, PERRL, sclerae muddy NECK: Supple, no JVD or bruit PULM: CTAB no c/w/r CV : tachcardic, II/VI systolic murmur, no  r/g ABD: soft, nondistended, nontender, +liver edge 3 cm below costal margin.  Spleen not appreciated EXT: no LE edema NEURO: AAO x 3, no asterixis, no tremor MSK: no synovitis SKIN: no rashes or lesions, mult tattoos  Imaging: No results found.  Labs: BMET  Recent Labs Lab 08/04/16 0305 08/05/16 0609 08/05/16 1234 08/06/16 0706 08/07/16 0630  NA 140 141  --  139 139  K 4.6 4.8  --  5.0 5.0  CL 113* 115*  --  111 112*  CO2  --  16*  --  17* 16*  GLUCOSE 92 92  --  98 85  BUN 88* 69*  --  69* 67*  CREATININE 14.40* 11.98*  --  12.08* 11.85*  CALCIUM  --  9.4  --  9.2 9.2  PHOS  --   --  4.9*  --   --    CBC  Recent Labs Lab 08/04/16 0253  08/04/16 1423 08/05/16 0609 08/06/16 0706 08/07/16 0630  WBC 8.0  --   --  9.2 6.6 7.8  NEUTROABS 5.6  --   --   --  3.9 4.9  HGB 5.3*  < > 8.3* 8.0* 7.5* 7.6*  HCT 16.2*  < > 24.9* 23.7* 22.9* 23.2*  MCV 96.4  --   --  91.5 92.7 92.8  PLT 158  --   --  166 162 179  < > = values in this interval not displayed.  Medications:    . amLODipine  10 mg Oral Daily  . calcitRIOL  0.25 mcg Oral Daily  . carvedilol  6.25 mg Oral BID WC  . cefUROXime (ZINACEF)  IV  1.5 g Intravenous To SS-Surg  . darbepoetin (ARANESP) injection - NON-DIALYSIS  40 mcg Subcutaneous Q Tue-1800  . folic acid  1 mg Oral Daily  . multivitamin with minerals  1 tablet Oral Daily  . nicotine  14 mg Transdermal Daily  . polyethylene glycol  17 g Oral BID  . sodium chloride flush  3 mL Intravenous Q12H  . thiamine  100 mg Oral Daily   Or  . thiamine  100 mg Intravenous Daily     Madelon Lips MD [facility] Cell [phone] pgr [phone] 08/07/2016, 12:43 PM

## 2016-08-07 NOTE — Interval H&P Note (Signed)
History and Physical Interval Note:  08/07/2016 2:35 PM  Jordan [name]  has presented today for surgery, with the diagnosis of Stage V Chronic Kidney Disease N18.5  The various methods of treatment have been discussed with the patient and family. After consideration of risks, benefits and other options for treatment, the patient has consented to  Procedure(s): BASILIC VEIN TRANSPOSITION (Left) INSERTION OF DIALYSIS CATHETER (N/A) as a surgical intervention .  The patient's history has been reviewed, patient examined, no change in status, stable for surgery.  I have reviewed the patient's chart and labs.  Questions were answered to the patient's satisfaction.     [name]

## 2016-08-07 NOTE — Op Note (Signed)
    NAME: Jordan [name]   MRN: [id number] DOB: June 22, 1966    DATE OF OPERATION: 08/07/2016  PREOP DIAGNOSIS: Stage V chronic kidney disease  POSTOP DIAGNOSIS: Same  PROCEDURE:  1. Ultrasound-guided placement of right IJ 23 cm tunneled dialysis catheter 2. Left basilic vein transposition  SURGEON: Judeth Cornfield. Scot Dock, MD, FACS  ASSIST: Silva Bandy, [facility]  ANESTHESIA: Gen.   EBL: Minimal  INDICATIONS: Jordan [name] is a 50 y.o. male who presents for new access.  FINDINGS: 4 mm left upper arm basilic vein  TECHNIQUE: The patient was taken to the operative and received a general anesthetic. The neck and upper chest were prepped and draped in usual sterile fashion. Under ultrasound guidance, the right IJ was cannulated and a guidewire introduced into the suprarenal cava under fluoroscopic control. The tract over the wire was dilated and then the dilator and peel-away sheath were advanced over the wire and the wire and dilator were removed. The 23 cm catheter was passed through the peel-away sheath and positioned in the right atrium. The exit site for the catheter was selected and the skin anesthetized between the 2 areas. The catheter was brought through the tunnel and cut to the appropriate length and the distal ports were attached. Both ports withdrew easily and flushed with heparinized saline and filled with concentrated heparin. The catheter was secured at its exit site with a 3-0 nylon suture. The IJ cannulation site was closed with a 40 septic or stitch. A sterile dressing was applied.  Attention was then turned to the left arm. The basilic vein by duplex. 3 reasonable in size. Using 3 incisions along the medial aspect of the left upper arm, the basilic vein was harvested from the antecubital level to the axilla. Through the distal incision the brachial artery was dissected free beneath the fascia. Of note there was some clot in the basilic vein at the cubital level. The vein was  ligated distally and the small segment of clot was removed. The vein was then irrigated with heparinized saline. It was marked to prevent twisting. It was fully mobilized with all branches divided between clips and 3-0 silk ties. It did empty into the brachial system in the mid upper arm and harvested the brachial vein from that level. A tunnel was created from the antecubital incision to the axillary incision and the vein was brought to the tunnel. The brachial artery was clamped proximally and distally and a longitudinal arteriotomy was made. The vein was cut in appropriate length, spatulated, and sewn end-to-side to the artery using continuous 6-0 Prolene suture. At the completion was excellent thrill in the fistula and a palpable radial pulse. The heparin was partially reversed with protamine. The wounds were closed with the clear 3-0 Vicryl the skin closed with 4-0 Vicryl. Irma Bond was applied. The patient tolerated the procedure well and was transferred to the recovery room in stable condition. All needle and sponge counts were correct.   [name], MD, FACS [facility]  DATE OF DICTATION:   08/07/2016

## 2016-08-07 NOTE — Anesthesia Postprocedure Evaluation (Signed)
Anesthesia Post Note  Patient: LARNCE SCHNACKENBERG  Procedure(s) Performed: Procedure(s) (LRB): LEFT BASILIC VEIN TRANSPOSITION (Left) INSERTION OF TUNNELED DIALYSIS CATHETER (N/A)  Patient location during evaluation: PACU Anesthesia Type: General Level of consciousness: awake and alert Pain management: pain level controlled Vital Signs Assessment: post-procedure vital signs reviewed and stable Respiratory status: spontaneous breathing, nonlabored ventilation, respiratory function stable and patient connected to nasal cannula oxygen Cardiovascular status: blood pressure returned to baseline and stable Postop Assessment: no signs of nausea or vomiting Anesthetic complications: no    Last Vitals:  Vitals:   08/07/16 1807 08/07/16 1822  BP: 113/75 113/78  Pulse: 86 85  Resp: (!) 21 17  Temp:      Last Pain:  Vitals:   08/07/16 2000  TempSrc:   PainSc: [place]

## 2016-08-07 NOTE — Progress Notes (Signed)
Pre-procedure check list complete. Report given to Rolla Flatten, RN in short stay. Patient transferred to OR for fistula placement. Wyonia Hough

## 2016-08-07 NOTE — Transfer of Care (Signed)
Immediate Anesthesia Transfer of Care Note  Patient: Jordan [name]  Procedure(s) Performed: Procedure(s): LEFT BASILIC VEIN TRANSPOSITION (Left) INSERTION OF TUNNELED DIALYSIS CATHETER (N/A)  Patient Location: PACU  Anesthesia Type:General  Level of Consciousness: awake, alert , oriented and patient cooperative  Airway & Oxygen Therapy: Patient Spontanous Breathing and Patient connected to nasal cannula oxygen  Post-op Assessment: Report given to RN and Post -op Vital signs reviewed and stable  Post vital signs: Reviewed and stable  Last Vitals:  Vitals:   08/07/16 1023 08/07/16 1738  BP: 134/84   Pulse: 79   Resp:    Temp:  36.4 C    Last Pain:  Vitals:   08/07/16 1738  TempSrc:   PainSc: 0-No pain         Complications: No apparent anesthesia complications

## 2016-08-07 NOTE — H&P (View-Only) (Signed)
[facility] [place]  Patient name: Jordan [name] MRN: [id number] DOB: [birth year] Sex: male  REASON FOR CONSULT: TDC and permanent access, consult is from Dr. Hollie Salk.  HPI: Jordan [name] is a 50 y.o. male who presents for permanent dialysis access and tunneled dialysis catheter placement. He is not yet on hemodialysis but will be starting soon. He has never had access procedures before. He is right handed. He presented to the [facility] ED with progressive shortness of breath. He has a history of renal insufficiency, tobacco abuse and alcohol abuse. He was also found to have a hemoglobin of 5.1 on admission. He had heme positive stools and iron deficiency anemia. GI is planning on endoscopy and colonoscopy early next week after dialysis is initiated.   He is a current every day smoker.   Past Medical History:  Diagnosis Date  . CKD (chronic kidney disease)   . ETOH abuse     Family History  Problem Relation Age of Onset  . Diabetes Mother   . Kidney failure Father   . Kidney failure Brother     SOCIAL HISTORY: Social History   Social History  . Marital status: Single    Spouse name: N/A  . Number of children: N/A  . Years of education: N/A   Occupational History  . Not on file.   Social History Main Topics  . Smoking status: Current Every Day Smoker    Packs/day: 0.50    Years: 15.00    Types: Cigarettes  . Smokeless tobacco: Never Used  . Alcohol use 12.6 oz/week    21 Cans of beer per week  . Drug use: Unknown  . Sexual activity: Not on file   Other Topics Concern  . Not on file   Social History Narrative  . No narrative on file    No Known Allergies  Current Facility-Administered Medications  Medication Dose Route Frequency Provider Last Rate Last Dose  . 0.9 %  sodium chloride infusion   Intravenous Continuous Norval Morton, MD 10 mL/hr at 08/04/16 0826    . acetaminophen (TYLENOL) tablet 650 mg  650 mg Oral Q6H PRN Rondel Jumbo, PA-C   650 mg at 08/06/16 1125   Or  . acetaminophen (TYLENOL) suppository 650 mg  650 mg Rectal Q6H PRN Rondel Jumbo, PA-C      . albuterol (PROVENTIL) (2.5 MG/3ML) 0.083% nebulizer solution 2.5 mg  2.5 mg Nebulization Q4H PRN Waldemar Dickens, MD   2.5 mg at 08/04/16 2059  . amLODipine (NORVASC) tablet 10 mg  10 mg Oral Daily Shon Millet, DO   10 mg at 08/06/16 0909  . bisacodyl (DULCOLAX) suppository 10 mg  10 mg Rectal Daily PRN Rondel Jumbo, PA-C      . calcitRIOL (ROCALTROL) capsule 0.25 mcg  0.25 mcg Oral Daily Madelon Lips, MD   0.25 mcg at 08/06/16 1638  . carvedilol (COREG) tablet 6.25 mg  6.25 mg Oral BID WC Madelon Lips, MD   6.25 mg at 08/06/16 1638  . [START ON 08/07/2016] cefUROXime (ZINACEF) 1.5 g in dextrose 5 % 50 mL IVPB  1.5 g Intravenous To SS-Surg Alvia Grove, PA-C      . Darbepoetin Alfa (ARANESP) injection 40 mcg  40 mcg Subcutaneous Q Tue-1800 Madelon Lips, MD   40 mcg at 08/05/16 1704  . folic acid (FOLVITE) tablet 1 mg  1 mg Oral Daily Rondel Jumbo, PA-C   1  mg at 08/06/16 0908  . hydrALAZINE (APRESOLINE) injection 10 mg  10 mg Intravenous Q6H PRN Gardiner Barefoot, NP   10 mg at 08/05/16 0558  . Influenza vac split quadrivalent PF (FLUARIX) injection 0.5 mL  0.5 mL Intramuscular Prior to discharge Shon Millet, DO      . labetalol (NORMODYNE,TRANDATE) injection 10 mg  10 mg Intravenous Q10 min PRN Shon Millet, DO      . LORazepam (ATIVAN) tablet 1 mg  1 mg Oral Q6H PRN Rondel Jumbo, PA-C       Or  . LORazepam (ATIVAN) injection 1 mg  1 mg Intravenous Q6H PRN Rondel Jumbo, PA-C      . multivitamin with minerals tablet 1 tablet  1 tablet Oral Daily Rondel Jumbo, PA-C   1 tablet at 08/06/16 0908  . nicotine (NICODERM CQ - dosed in mg/24 hours) patch 14 mg  14 mg Transdermal Daily Rondel Jumbo, PA-C   14 mg at 08/05/16 0834  . ondansetron (ZOFRAN) tablet 4 mg  4 mg Oral Q6H PRN Rondel Jumbo, PA-C        Or  . ondansetron [facility]) injection 4 mg  4 mg Intravenous Q6H PRN Rondel Jumbo, PA-C      . pneumococcal 23 valent vaccine (PNU-IMMUNE) injection 0.5 mL  0.5 mL Intramuscular Prior to discharge Shon Millet, DO      . polyethylene glycol (MIRALAX / GLYCOLAX) packet 17 g  17 g Oral BID Ronald Lobo, MD   17 g at 08/06/16 0908  . senna-docusate (Senokot-S) tablet 1 tablet  1 tablet Oral QHS PRN Rondel Jumbo, PA-C      . sodium chloride (OCEAN) 0.65 % nasal spray 1 spray  1 spray Each Nare PRN Waldemar Dickens, MD   1 spray at 08/05/16 [phone]  . sodium chloride flush (NS) 0.9 % injection 3 mL  3 mL Intravenous Q12H Rondel Jumbo, PA-C   3 mL at 08/06/16 0910  . thiamine (VITAMIN B-1) tablet 100 mg  100 mg Oral Daily Rondel Jumbo, PA-C   100 mg at 08/06/16 3419   Or  . thiamine (B-1) injection 100 mg  100 mg Intravenous Daily Rondel Jumbo, PA-C      . traZODone (DESYREL) tablet 25 mg  25 mg Oral QHS PRN Rondel Jumbo, PA-C        REVIEW OF SYSTEMS:  [X]  denotes positive finding, [ ]  denotes negative finding Cardiac  Comments:  Chest pain or chest pressure:    Shortness of breath upon exertion:    Short of breath when lying flat:    Irregular heart rhythm:        Vascular    Pain in calf, thigh, or hip brought on by ambulation:    Pain in feet at night that wakes you up from your sleep:     Blood clot in your veins:    Leg swelling:         Pulmonary    Oxygen at home:    Productive cough:     Wheezing:         Neurologic    Sudden weakness in arms or legs:     Sudden numbness in arms or legs:     Sudden onset of difficulty speaking or slurred speech:    Temporary loss of vision in one eye:     Problems with dizziness:  Gastrointestinal    Blood in stool:     Vomited blood:         Genitourinary    Burning when urinating:     Blood in urine:        Psychiatric    Major depression:         Hematologic    Bleeding problems:    Problems  with blood clotting too easily:        Skin    Rashes or ulcers:        Constitutional    Fever or chills:      PHYSICAL EXAM: Vitals:   08/05/16 1800 08/06/16 0001 08/06/16 0609 08/06/16 1420  BP: (!) 154/96 (!) 148/90 (!) 153/98 131/83  Pulse: 92 93 90 79  Resp:  17 17   Temp: 98.6 F (37 C) 98.7 F (37.1 C) 98.3 F (36.8 C) 98.1 F (36.7 C)  TempSrc: Oral Oral Oral Oral  SpO2: 100% 100% 100% 100%  Weight:      Height:        GENERAL: The patient is a well-nourished male, in no acute distress. The vital signs are documented above. VASCULAR: Palpable left radial and brachial pulse.  PULMONARY: Non labored respiratory effort.  MUSCULOSKELETAL: There are no major deformities or cyanosis. NEUROLOGIC: No focal weakness or paresthesias are detected. SKIN: There are no ulcers or rashes noted. PSYCHIATRIC: The patient has a normal affect.   MEDICAL ISSUES: New ESRD  Based on vein mapping, the patient has an adequate left basilic vein. He is right handed. Plan for left basilic vein transposition and insertion of tunneled dialysis catheter tomorrow with Dr. Scot Dock. The patient understands that basilic vein fistulas can be staged procedures. We will determine whether his surgery will be one stage versus two based on size of vein intra-op. NPO past midnight.    Virgina Jock, PA-C [facility] [place]    I agree with the above.  I have seen and examined the patient.  Based on vein mapping review and the fact that he is right handed, I have recommended a left arm BVT, either single stage or 2 stage procedure.  We discussed the risk of steal and non-maturity.  He will also need a catheter.  The left arm IV will need to be removed.  He is scheduled for tomorrow.  Annamarie Major

## 2016-08-07 NOTE — Anesthesia Preprocedure Evaluation (Addendum)
Anesthesia Evaluation  Patient identified by MRN, date of birth, ID band Patient awake    Reviewed: Allergy & Precautions, H&P , NPO status , Patient's Chart, lab work & pertinent test results  Airway Mallampati: II  TM Distance: >3 FB Neck ROM: Full    Dental no notable dental hx. (+) Poor Dentition, Dental Advisory Given   Pulmonary Current Smoker,    Pulmonary exam normal breath sounds clear to auscultation       Cardiovascular hypertension, Pt. on medications  Rhythm:Regular Rate:Normal     Neuro/Psych negative neurological ROS  negative psych ROS   GI/Hepatic negative GI ROS,   Endo/Other  negative endocrine ROS  Renal/GU CRFRenal disease  negative genitourinary   Musculoskeletal   Abdominal   Peds  Hematology negative hematology ROS (+) anemia ,   Anesthesia Other Findings   Reproductive/Obstetrics negative OB ROS                           Anesthesia Physical Anesthesia Plan  ASA: III  Anesthesia Plan: General   Post-op Pain Management:    Induction: Intravenous  Airway Management Planned: LMA  Additional Equipment:   Intra-op Plan:   Post-operative Plan: Extubation in OR  Informed Consent: I have reviewed the patients History and Physical, chart, labs and discussed the procedure including the risks, benefits and alternatives for the proposed anesthesia with the patient or authorized representative who has indicated his/her understanding and acceptance.   Dental advisory given  Plan Discussed with: CRNA  Anesthesia Plan Comments:        Anesthesia Quick Evaluation

## 2016-08-07 NOTE — Progress Notes (Signed)
PROGRESS NOTE        PATIENT DETAILS Name: Jordan [name] Age: 50 y.o. Sex: male Date of Birth: [birth year] Admit Date: 08/04/2016 Admitting Physician Waldemar Dickens, MD PCP:No PCP Per Patient  Brief Narrative: Patient is a 50 y.o. male who has not seen a PCP in the past several years presented with shortness of breath, was found to have hemoglobin of 5.1 and a creatinine of 14.4. Patient was subsequently admitted to the hospitalist service for further evaluation and treatment.  Subjective: Lying comfortably in bed-denies chest pain or shortness of breath.   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 Extensive workup including HIV, hep B/C, RPR negative. A1c only at 5.3 SPEP without any M spike. No hydronephrosis seen on renal ultrasound. Creatinine slowly down trending, but seems to have plateaued over the past few days. Nephrology now recommending initiation of hemodialysis. Vascular surgery planning on hemodialysis catheter placement and left basilic vein transposition today.  Anemia: No overt GI loss by history-although FOBT positive-GI following and contemplating EGD/colonoscopy before discharge. 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 Transfused 2 units of PRBC on 11/13, also started on iron supplementation-hemoglobin stable for now. Follow  Hypertension: Better controlled, continue amlodipine and Coreg. Follow and optimize accordingly  Alcohol abuse: No signs of withdrawal at present, continue with Ativan per protocol  Tobacco abuse: Counseled, continue transdermal nicotine   DVT Prophylaxis:  SCD's-given concern for occult GI bleed  Code Status: Full code    Family Communication: None at bedside  Disposition Plan: Remain inpatient-requires several more days of hospitalization  Antimicrobial agents: None  Procedures: None  CONSULTS:  GI and nephrology  Time spent: 25 minutes-Greater than 50% of this time was spent in counseling, explanation of diagnosis, planning of further management, and coordination of care.  MEDICATIONS: Anti-infectives    Start     Dose/Rate Route Frequency Ordered Stop   08/07/16 0600  cefUROXime (ZINACEF) 1.5 g in dextrose 5 % 50 mL IVPB     1.5 g 100 mL/hr over 30 Minutes Intravenous To [facility] 08/06/16 1911 08/08/16 0600      Scheduled Meds: . amLODipine  10 mg Oral Daily  . calcitRIOL  0.25 mcg Oral Daily  . carvedilol  6.25 mg Oral BID WC  . cefUROXime (ZINACEF)  IV  1.5 g Intravenous To SS-Surg  . darbepoetin (ARANESP) injection - NON-DIALYSIS  40 mcg Subcutaneous Q Tue-1800  . folic acid  1 mg Oral Daily  . multivitamin with minerals  1 tablet Oral Daily  . nicotine  14 mg Transdermal Daily  . polyethylene glycol  17 g Oral BID  . sodium chloride flush  3 mL Intravenous Q12H  . thiamine  100 mg Oral Daily   Or  . thiamine  100 mg Intravenous Daily   Continuous Infusions: . sodium chloride 10 mL/hr at 08/04/16 0826   PRN Meds:.acetaminophen **OR** acetaminophen, albuterol, bisacodyl, hydrALAZINE, Influenza vac split quadrivalent PF, labetalol, ondansetron **OR** ondansetron (ZOFRAN) IV, pneumococcal 23 valent vaccine, senna-docusate, sodium chloride, traZODone   PHYSICAL EXAM: Vital signs: Vitals:   08/06/16 1420 08/07/16 0559 08/07/16 0600 08/07/16 1023  BP: 131/83  (!) 142/85 134/84  Pulse:  79  81 79  Resp:   16   Temp: 98.1 F (36.7 C)  99.5 F (37.5 C)   TempSrc: Oral  Oral   SpO2: 100%  96%   Weight:  68.6 kg (151 lb 3.2 oz)    Height:       Filed Weights   08/04/16 0239 08/05/16 0557 08/07/16 0559  Weight: 74.8 kg (165 lb) 75 kg (165 lb 5.5 oz) 68.6 kg (151  lb 3.2 oz)   Body mass index is 25.95 kg/m.   General appearance :Awake, alert, not in any distress. Speech Clear. Not toxic Looking Eyes:, pupils equally reactive to light and accomodation,no scleral icterus.Pink conjunctiva HEENT: Atraumatic and Normocephalic Neck: supple, no JVD. No cervical lymphadenopathy. No thyromegaly Resp:Good air entry bilaterally, no added sounds  CVS: S1 S2 regular, no murmurs.  GI: Bowel sounds present, Non tender and not distended with no gaurding, rigidity or rebound.No organomegaly Extremities: B/L Lower Ext shows no edema, both legs are warm to touch Neurology:  speech clear,Non focal, sensation is grossly intact. Psychiatric: Normal judgment and insight. Alert and oriented x 3. Normal mood. Musculoskeletal:.No digital cyanosis Skin:No Rash, warm and dry Wounds:N/A  I have personally reviewed following labs and imaging studies  LABORATORY DATA: CBC:  Recent Labs Lab 08/04/16 0253 08/04/16 0305 08/04/16 1423 08/05/16 0609 08/06/16 0706 08/07/16 0630  WBC 8.0  --   --  9.2 6.6 7.8  NEUTROABS 5.6  --   --   --  3.9 4.9  HGB 5.3* 5.1* 8.3* 8.0* 7.5* 7.6*  HCT 16.2* 15.0* 24.9* 23.7* 22.9* 23.2*  MCV 96.4  --   --  91.5 92.7 92.8  PLT 158  --   --  166 162 242    Basic Metabolic Panel:  Recent Labs Lab 08/04/16 0305 08/05/16 0609 08/05/16 1234 08/06/16 0706 08/07/16 0630  NA 140 141  --  139 139  K 4.6 4.8  --  5.0 5.0  CL 113* 115*  --  111 112*  CO2  --  16*  --  17* 16*  GLUCOSE 92 92  --  98 85  BUN 88* 69*  --  69* 67*  CREATININE 14.40* 11.98*  --  12.08* 11.85*  CALCIUM  --  9.4  --  9.2 9.2  PHOS  --   --  4.9*  --   --     GFR: Estimated Creatinine Clearance: 6.4 mL/min (by C-G formula based on SCr of 11.85 mg/dL (H)).  Liver Function Tests:  Recent Labs Lab 08/04/16 0253 08/05/16 0609  AST 22 15  ALT 20 16*  ALKPHOS 78 82  BILITOT 0.4 0.9  PROT 6.4* 6.6  ALBUMIN 3.2* 3.1*   No results for input(s):  LIPASE, AMYLASE in the last 168 hours. No results for input(s): AMMONIA in the last 168 hours.  Coagulation Profile: No results for input(s): INR, PROTIME in the last 168 hours.  Cardiac Enzymes: No results for input(s): CKTOTAL, CKMB, CKMBINDEX, TROPONINI in the last 168 hours.  BNP (last 3 results) No results for input(s): PROBNP in the last 8760 hours.  HbA1C:  Recent Labs  08/04/16 1423  HGBA1C 5.3    CBG: No results for input(s): GLUCAP in the last 168 hours.  Lipid Profile: No results for input(s): CHOL, HDL, LDLCALC, TRIG, CHOLHDL, LDLDIRECT in the last 72 hours.  Thyroid Function Tests: No results for input(s): TSH, T4TOTAL, FREET4, T3FREE, THYROIDAB in the last 72 hours.  Anemia Panel: No results  for input(s): VITAMINB12, FOLATE, FERRITIN, TIBC, IRON, RETICCTPCT in the last 72 hours.  Urine analysis:    Component Value Date/Time   COLORURINE STRAW (A) 08/04/2016 0448   APPEARANCEUR CLOUDY (A) 08/04/2016 0448   LABSPEC 1.011 08/04/2016 0448   PHURINE 5.5 08/04/2016 0448   GLUCOSEU 100 (A) 08/04/2016 0448   HGBUR SMALL (A) 08/04/2016 0448   BILIRUBINUR NEGATIVE 08/04/2016 0448   KETONESUR NEGATIVE 08/04/2016 0448   PROTEINUR >300 (A) 08/04/2016 0448   NITRITE NEGATIVE 08/04/2016 0448   LEUKOCYTESUR NEGATIVE 08/04/2016 0448    Sepsis Labs: Lactic Acid, Venous No results found for: LATICACIDVEN  MICROBIOLOGY: Recent Results (from the past 240 hour(s))  Urine culture     Status: None   Collection Time: 08/04/16 10:17 AM  Result Value Ref Range Status   Specimen Description URINE, CLEAN CATCH  Final   Special Requests NONE  Final   Culture NO GROWTH  Final   Report Status 08/05/2016 FINAL  Final  Culture, sputum-assessment     Status: None   Collection Time: 08/07/16  5:17 AM  Result Value Ref Range Status   Specimen Description EXPECTORATED SPUTUM  Final   Special Requests NONE  Final   Sputum evaluation   Final    MICROSCOPIC FINDINGS SUGGEST  THAT THIS SPECIMEN IS NOT REPRESENTATIVE OF LOWER RESPIRATORY SECRETIONS. PLEASE RECOLLECT. Gram Stain Report Called to,Read Back By and Verified With: A BAKER,RN @0722  08/07/16 MKELLY,MLT    Report Status 08/07/2016 FINAL  Final    RADIOLOGY STUDIES/RESULTS: Dg Chest 2 View  Result Date: 08/04/2016 CLINICAL DATA:  50 y/o M; shortness of breath and mid chest pain for 1 week. EXAM: CHEST  2 VIEW COMPARISON:  None. FINDINGS: Moderate bronchitic changes. No focal consolidation. No pneumothorax or effusion. Normal cardiac silhouette. Mild degenerative changes of thoracic spine. IMPRESSION: Moderate bronchitic changes.  No focal consolidation. Electronically Signed   By: Kristine Garbe M.D.   On: 08/04/2016 04:10   US Renal  Result Date: 08/04/2016 CLINICAL DATA:  Chronic renal insufficiency EXAM: RENAL / URINARY TRACT ULTRASOUND COMPLETE COMPARISON:  None in PACs FINDINGS: Right Kidney: Length: 7.2 cm. The renal cortical echotexture is markedly increased and greater than that of the adjacent liver. There is no hydronephrosis. No discrete mass is observed. Left Kidney: Length: 9.1 cm. The renal cortical echotexture is markedly increased similar to that on the right. There is no hydronephrosis nor parenchymal mass. Bladder: The partially distended urinary bladder is normal. The prostate gland produces a mild irregular impression upon the urinary bladder base. IMPRESSION: Renal cortical atrophy and increased renal cortical echotexture consistent with medical renal disease. No hydronephrosis. Normal appearance of the urinary bladder. Electronically Signed   By: David  Martinique M.D.   On: 08/04/2016 13:25     LOS: 3 days   Oren Binet, MD  [facility] Pager:[phone]  If 7PM-7AM, please contact night-coverage [email or web address] Password [email or web address] 08/07/2016, 11:29 AM

## 2016-08-08 ENCOUNTER — Other Ambulatory Visit: Payer: Self-pay | Admitting: *Deleted

## 2016-08-08 ENCOUNTER — Encounter (HOSPITAL_COMMUNITY): Payer: Self-pay | Admitting: Vascular Surgery

## 2016-08-08 DIAGNOSIS — Z4931 Encounter for adequacy testing for hemodialysis: Secondary | ICD-10-CM

## 2016-08-08 DIAGNOSIS — N186 End stage renal disease: Secondary | ICD-10-CM

## 2016-08-08 LAB — TYPE AND SCREEN
ABO/RH(D): O NEG
Antibody Screen: NEGATIVE
Unit division: 0
Unit division: 0
Unit division: 0
Unit division: 0
Unit division: 0
Unit division: 0

## 2016-08-08 LAB — ANTI-DNA ANTIBODY, DOUBLE-STRANDED: ds DNA Ab: <1 IU/mL (ref 0–9)

## 2016-08-08 LAB — CBC WITH DIFFERENTIAL/PLATELET
Basophils Absolute: 0 10*3/uL (ref 0.0–0.1)
Basophils Relative: 0 %
Eosinophils Absolute: 0.8 10*3/uL — ABNORMAL HIGH (ref 0.0–0.7)
Eosinophils Relative: 9 %
HCT: 27.6 % — ABNORMAL LOW (ref 39.0–52.0)
Hemoglobin: 8.9 g/dL — ABNORMAL LOW (ref 13.0–17.0)
Lymphocytes Relative: 11 %
Lymphs Abs: 0.9 10*3/uL (ref 0.7–4.0)
MCH: 29.5 pg (ref 26.0–34.0)
MCHC: 32.2 g/dL (ref 30.0–36.0)
MCV: 91.4 fL (ref 78.0–100.0)
Monocytes Absolute: 1.3 10*3/uL — ABNORMAL HIGH (ref 0.1–1.0)
Monocytes Relative: 15 %
Neutro Abs: 5.3 10*3/uL (ref 1.7–7.7)
Neutrophils Relative %: 65 %
Platelets: 148 10*3/uL — ABNORMAL LOW (ref 150–400)
RBC: 3.02 MIL/uL — ABNORMAL LOW (ref 4.22–5.81)
RDW: 17.6 % — ABNORMAL HIGH (ref 11.5–15.5)
WBC: 8.2 10*3/uL (ref 4.0–10.5)

## 2016-08-08 LAB — BASIC METABOLIC PANEL
Anion gap: 12 (ref 5–15)
BUN: 64 mg/dL — ABNORMAL HIGH (ref 6–20)
CO2: 14 mmol/L — ABNORMAL LOW (ref 22–32)
Calcium: 8.6 mg/dL — ABNORMAL LOW (ref 8.9–10.3)
Chloride: 112 mmol/L — ABNORMAL HIGH (ref 101–111)
Creatinine, Ser: 11.59 mg/dL — ABNORMAL HIGH (ref 0.61–1.24)
GFR calc Af Amer: 5 mL/min — ABNORMAL LOW (ref >60)
GFR calc non Af Amer: 5 mL/min — ABNORMAL LOW (ref >60)
Glucose, Bld: 87 mg/dL (ref 65–99)
Potassium: 4.9 mmol/L (ref 3.5–5.1)
Sodium: 138 mmol/L (ref 135–145)

## 2016-08-08 LAB — KAPPA/LAMBDA LIGHT CHAINS
Kappa free light chain: 186.8 mg/L — ABNORMAL HIGH (ref 3.3–19.4)
Kappa, lambda light chain ratio: 1.37 (ref 0.26–1.65)
Lambda free light chains: 136.1 mg/L — ABNORMAL HIGH (ref 5.7–26.3)

## 2016-08-08 LAB — VITAMIN D 25 HYDROXY (VIT D DEFICIENCY, FRACTURES): Vit D, 25-Hydroxy: 19.6 ng/mL — ABNORMAL LOW (ref 30.0–100.0)

## 2016-08-08 MED ORDER — TUBERCULIN PPD 5 UNIT/0.1ML ID SOLN
5.0000 [IU] | Freq: Once | INTRADERMAL | Status: AC
  Administered 2016-08-08: 5 [IU] via INTRADERMAL
  Filled 2016-08-08: qty 0.1

## 2016-08-08 MED ORDER — VITAMIN D 1000 UNITS PO TABS
1000.0000 [IU] | ORAL_TABLET | Freq: Every day | ORAL | Status: DC
  Administered 2016-08-08: 1000 [IU] via ORAL
  Filled 2016-08-08: qty 1

## 2016-08-08 NOTE — Progress Notes (Signed)
Nutrition Education Note  Consulted by MD for new ESRD nutrition education.  Provided pt with Food Pyramid for Healthy Eating with Kidney Disease handout. RD and Intern contact information provided with handout.  Pt is s/p insertion of dialysis catheter on 08/07/16. Pt consuming lunch meal at time of visit. Pt reports having a good appetite. Discussed foods high in phosphorus, potassium, and sodium. Pt states he likes some high-potassium fruits such as oranges and bananas. Counseled pt regarding switching to foods lower in potassium. Pt states he likes eggs and grits which are appropriate foods to consume on the renal diet.  Discussed appropriate servings per day of each food group. Pt appreciate of education and does not believe these diet changes will be an issue.  Teach-back method used.  Body mass index is 25.8 kg/m. BMI categorized as Overweight.  Expect fair compliance.  There are no other nutrition concerns at this time. If new nutrition needs arise, please re-consult RD.  Jordan [name] Dietetic Intern Pager Number: [phone]

## 2016-08-08 NOTE — Progress Notes (Signed)
Report given to dialysis nurse. They will put in for transportation at 1315.

## 2016-08-08 NOTE — Progress Notes (Signed)
[facility] Progress Note    Assessment/ Plan:   1. 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  Hep B/C, HIV, RPR all negative.  SPEP negative; HgbA1c 5.3--> but has some glucose on Ua, Kappa/ Lambda light chains with ratio 1.37.  UP/C with ~ 2 g protein.  ANA + 1:80 (weakly). Renal U/S showing very echogenic kidneys without hydro.  Creatinine is 12 and seems to have plateaued.  New ESRD. - hepatitis studies already complete - HIV complete - needs PPD- have placed order (11/17) - CXR without cavitary lung lesions (11/13)   2.  Symptomatic anemia: Tsat 7, getting ferraheme and 1u pRBCs.  Chronic component but some is likely acute based on history of dark stools.  starting Aranesp 40 q week.  Hgb goal 10-11.    3.  Dark tarry stools: GI consult, planning for scopes closer to d/c and until after HD initiated--> likely Monday?  4.  HTN: amlodipine 10 mg daily added today, also adding Coreg 6.25 mg BID.  Will uptitrate.  No ACEi/ARB. BP improving--> maybe some was EtOH withdrawal?   4.  EtOH abuse: no tremor today, has Ativan if needed (CIWA protocol) 5.  Tobacco abuse: nicotine patch.  6.  Bone/mineral: PTH 455, Phos 4.9, no binder needed now, starting Calcitriol 0.25 mcg daily.  Vit D 19.6, starting cholecalciferol  Subjective:    For HD today--> #1 treatment   Objective:   BP (!) 143/83   Pulse 97   Temp 99 F (37.2 C) (Oral)   Resp (!) 24   Ht 5\' 4"  (1.626 m)   Wt 68.2 kg (150 lb 5.7 oz)   SpO2 100%   BMI 25.81 kg/m   Intake/Output Summary (Last 24 hours) at 08/08/16 1620 Last data filed at 08/08/16 1547  Gross per 24 hour  Intake             1930 ml  Output              518 ml  Net             1412 ml   Weight change: -0.408 kg (-14.4 oz)  Physical Exam: GEN: NAD, standing up HEENT: EOMI, PERRL, sclerae muddy NECK: Supple, no JVD or bruit PULM: CTAB no  c/w/r CV : tachcardic, II/VI systolic murmur, no r/g ABD: soft, nondistended, nontender, +liver edge 3 cm below costal margin.  Spleen not appreciated EXT: no LE edema NEURO: AAO x 3, no asterixis, no tremor MSK: no synovitis SKIN: no rashes or lesions, mult tattoos ACESS: R IJ TDC, LUE AVF with good bruit and thrill  Imaging: Dg Chest Port 1 View  Result Date: 08/07/2016 CLINICAL DATA:  Status post dialysis catheter insertion. EXAM: PORTABLE CHEST 1 VIEW COMPARISON:  08/04/2016. FINDINGS: Interval right jugular catheter with its tip at the superior cavoatrial junction. No pneumothorax. Borderline enlarged cardiac silhouette with prominent pulmonary vasculature. Clear lungs. Unremarkable bones. IMPRESSION: 1. Right jugular catheter tip at the superior cavoatrial junction without pneumothorax. 2. Pulmonary vascular congestion and borderline cardiomegaly. Electronically Signed   By: Claudie Revering M.D.   On: 08/07/2016 21:22    Labs: BMET  Recent Labs Lab 08/04/16 0305 08/05/16 0609 08/05/16 1234 08/06/16 0706 08/07/16 0630 08/08/16 0721  NA 140 141  --  139 139 138  K 4.6 4.8  --  5.0 5.0 4.9  CL 113* 115*  --  111 112* 112*  CO2  --  16*  --  17* 16* 14*  GLUCOSE 92 92  --  98 85 87  BUN 88* 69*  --  69* 67* 64*  CREATININE 14.40* 11.98*  --  12.08* 11.85* 11.59*  CALCIUM  --  9.4  --  9.2 9.2 8.6*  PHOS  --   --  4.9*  --   --   --    CBC  Recent Labs Lab 08/04/16 0253  08/05/16 0609 08/06/16 0706 08/07/16 0630 08/08/16 0721  WBC 8.0  --  9.2 6.6 7.8 8.2  NEUTROABS 5.6  --   --  3.9 4.9 5.3  HGB 5.3*  < > 8.0* 7.5* 7.6* 8.9*  HCT 16.2*  < > 23.7* 22.9* 23.2* 27.6*  MCV 96.4  --  91.5 92.7 92.8 91.4  PLT 158  --  166 162 179 148*  < > = values in this interval not displayed.  Medications:    . amLODipine  10 mg Oral Daily  . calcitRIOL  0.25 mcg Oral Daily  . carvedilol  6.25 mg Oral BID WC  . darbepoetin (ARANESP) injection - NON-DIALYSIS  40 mcg Subcutaneous  Q Tue-1800  . folic acid  1 mg Oral Daily  . multivitamin with minerals  1 tablet Oral Daily  . nicotine  14 mg Transdermal Daily  . polyethylene glycol  17 g Oral BID  . sodium chloride flush  3 mL Intravenous Q12H  . thiamine  100 mg Oral Daily   Or  . thiamine  100 mg Intravenous Daily     Madelon Lips MD [facility] [phone] pgr [phone] 08/08/2016, 4:20 PM

## 2016-08-08 NOTE — Procedures (Signed)
Patient seen and examined on Hemodialysis. QB 200 UF goal 0 mL. HD #1  2 hrs.  Tolerating treatment well but system clotted with 17 min to go.  Needs heparin next rx. Treatment adjusted as needed.  Madelon Lips MD [facility] Cell [phone] Pgr: [phone] 4:26 PM

## 2016-08-08 NOTE — Progress Notes (Addendum)
  [facility] Progress Note  Subjective  - POD #1  Having mild numbness left hand. Is tolerable. Pain with incisions.   Objective Vitals:   08/07/16 2322 08/08/16 0638  BP: 113/69 131/77  Pulse: 100 93  Resp: 18 20  Temp: 98 F (36.7 C) 98.3 F (36.8 C)    Intake/Output Summary (Last 24 hours) at 08/08/16 0843 Last data filed at 08/08/16 0641  Gross per 24 hour  Intake             2320 ml  Output              630 ml  Net             1690 ml   Left upper arm fistula with palpable thrill. Some ecchymosis but arm is soft. Incisions clean and intact. Palpable left radial pulse.  Right IJ TDC  Assessment/Planning: 50 y.o. male is s/p: left basilic vein transposition and insertion of right IJ tunneled dialysis catheter.  1 Day Post-Op   Some mild left hand numbness. Tolerable. No pain. Palpable left radial pulse. Advised patient to notify us if this numbness worsens. Left upper arm fistula is patent.  Will sign off. F/u in 4-6 weeks with duplex to check maturation of fistula.   Alvia Grove 08/08/2016 8:43 AM --  I have interviewed the patient and examined the patient. I agree with the findings by the PA. Good thrill in BVT left arm. Palpable left radial pulse. I have arranged f/u in 6 weeks.   Gae Gallop, MD [phone]

## 2016-08-08 NOTE — Progress Notes (Signed)
PROGRESS NOTE    Jordan [name]  VHQ:[id number] DOB: 1965/11/18 DOA: 08/04/2016 PCP: No PCP Per Patient   Brief Narrative:  Jordan [name] is a 50 y.o. male not seen by a PCP in several years, with a history of tobacco abuse, presenting to the ED with several day history of progressive shortness of breath with wheezing waking him out from his sleep. Dyspnea is worse with exertion, cannot walk more than one block before experiencing these symptoms. He denies any hemoptysis. He denies any gross blood in the stools. Patient's hemoglobin on admission was 5.1. He received 2 units of packed red blood cells. He has never had a colonoscopy. GI was consulted as well as Nephrology for AKI.    Assessment & Plan:   Active Problems:   Tobacco abuse   ETOH abuse   Uncontrolled hypertension   Acute renal failure (ARF) (HCC)   Shortness of breath   Symptomatic anemia   Acute renal failure (HCC)   Hypertensive emergency  Symptomatic anemia, combination of iron deficiency and blood loss anemia  -Received 2 units of packed red blood cells on 11/13  -Hemoccult positive  -Started on iron supplementation  -GI following: EGD and colonoscopy prior to discharge   Acute kidney injury, now end-stage  -Nephrology following: possibly renal failure due to ischemia in setting of symptomatic anemia with underlying hypertensive nephrosclerosis. Extensive workup including HIV, hep B/C, RPR negative. A1c only at 5.3 SPEP without any M spike. No hydronephrosis seen on renal ultrasound.  -Left upper extremity fistula placed 11/16. Need follow up 4-6 weeks with duplex to check maturation -Right IJ tunneled dialysis catheter placed 11/16  -HD per Nephrology   Uncontrolled hypertension -Norvasc and coreg   -No ACE/ARB -Hydralazine and labetalol as needed to maintain SBP less than 180  Alcohol abuse -CIWA scale  Tobacco abuse -Nicotine patch -Cessation counseling   DVT prophylaxis: SCDs Code Status:  Full Family Communication: no family at bedside Disposition Plan: pending further improvement    Consultants:   GI  Nephrology  Procedures:   None  Antimicrobials:   None     Subjective: Patient doing well today. Has some numbness of left hand but no pain. Denies any fevers, chest pain, cough, shortness of breath, nausea, vomiting, diarrhea, abdominal pain, dysuria. Patient is tolerating meals    Objective: Vitals:   08/07/16 1807 08/07/16 1822 08/07/16 2322 08/08/16 0638  BP: 113/75 113/78 113/69 131/77  Pulse: 86 85 100 93  Resp: (!) 21 17 18 20   Temp:   98 F (36.7 C) 98.3 F (36.8 C)  TempSrc:   Oral Oral  SpO2: 97% 97% 97% 100%  Weight:    68.2 kg (150 lb 4.8 oz)  Height:        Intake/Output Summary (Last 24 hours) at 08/08/16 1109 Last data filed at 08/08/16 1033  Gross per 24 hour  Intake             2680 ml  Output              630 ml  Net             2050 ml   Filed Weights   08/05/16 0557 08/07/16 0559 08/08/16 0638  Weight: 75 kg (165 lb 5.5 oz) 68.6 kg (151 lb 3.2 oz) 68.2 kg (150 lb 4.8 oz)    Examination:  General exam: Appears calm and comfortable  Respiratory system: Clear to auscultation. Respiratory effort normal. Cardiovascular system: S1 & S2  heard, RRR, systolic murmur. No pedal edema. Gastrointestinal system: Abdomen is nondistended, soft and nontender. Normal bowel sounds heard. Central nervous system: Alert and oriented. No focal neurological deficits. Extremities: Symmetric 5 x 5 power. Left upper extremity with fistula in place, palpable thrill Skin: No rashes, lesions or ulcers, Right chest with tunneled catheter in place  Psychiatry: Judgement and insight appear normal. Mood & affect appropriate.   Data Reviewed: I have personally reviewed following labs and imaging studies  CBC:  Recent Labs Lab 08/04/16 0253  08/04/16 1423 08/05/16 0609 08/06/16 0706 08/07/16 0630 08/08/16 0721  WBC 8.0  --   --  9.2 6.6 7.8 8.2   NEUTROABS 5.6  --   --   --  3.9 4.9 5.3  HGB 5.3*  < > 8.3* 8.0* 7.5* 7.6* 8.9*  HCT 16.2*  < > 24.9* 23.7* 22.9* 23.2* 27.6*  MCV 96.4  --   --  91.5 92.7 92.8 91.4  PLT 158  --   --  166 162 179 148*  < > = values in this interval not displayed. Basic Metabolic Panel:  Recent Labs Lab 08/04/16 0305 08/05/16 0609 08/05/16 1234 08/06/16 0706 08/07/16 0630 08/08/16 0721  NA 140 141  --  139 139 138  K 4.6 4.8  --  5.0 5.0 4.9  CL 113* 115*  --  111 112* 112*  CO2  --  16*  --  17* 16* 14*  GLUCOSE 92 92  --  98 85 87  BUN 88* 69*  --  69* 67* 64*  CREATININE 14.40* 11.98*  --  12.08* 11.85* 11.59*  CALCIUM  --  9.4  --  9.2 9.2 8.6*  PHOS  --   --  4.9*  --   --   --    GFR: Estimated Creatinine Clearance: 6.5 mL/min (by C-G formula based on SCr of 11.59 mg/dL (H)). Liver Function Tests:  Recent Labs Lab 08/04/16 0253 08/05/16 0609  AST 22 15  ALT 20 16*  ALKPHOS 78 82  BILITOT 0.4 0.9  PROT 6.4* 6.6  ALBUMIN 3.2* 3.1*   No results for input(s): LIPASE, AMYLASE in the last 168 hours. No results for input(s): AMMONIA in the last 168 hours. Coagulation Profile: No results for input(s): INR, PROTIME in the last 168 hours. Cardiac Enzymes: No results for input(s): CKTOTAL, CKMB, CKMBINDEX, TROPONINI in the last 168 hours. BNP (last 3 results) No results for input(s): PROBNP in the last 8760 hours. HbA1C: No results for input(s): HGBA1C in the last 72 hours. CBG: No results for input(s): GLUCAP in the last 168 hours. Lipid Profile: No results for input(s): CHOL, HDL, LDLCALC, TRIG, CHOLHDL, LDLDIRECT in the last 72 hours. Thyroid Function Tests: No results for input(s): TSH, T4TOTAL, FREET4, T3FREE, THYROIDAB in the last 72 hours. Anemia Panel: No results for input(s): VITAMINB12, FOLATE, FERRITIN, TIBC, IRON, RETICCTPCT in the last 72 hours. Sepsis Labs: No results for input(s): PROCALCITON, LATICACIDVEN in the last 168 hours.  Recent Results (from the past  240 hour(s))  Urine culture     Status: None   Collection Time: 08/04/16 10:17 AM  Result Value Ref Range Status   Specimen Description URINE, CLEAN CATCH  Final   Special Requests NONE  Final   Culture NO GROWTH  Final   Report Status 08/05/2016 FINAL  Final  Culture, sputum-assessment     Status: None   Collection Time: 08/07/16  5:17 AM  Result Value Ref Range Status   Specimen  Description EXPECTORATED SPUTUM  Final   Special Requests NONE  Final   Sputum evaluation   Final    MICROSCOPIC FINDINGS SUGGEST THAT THIS SPECIMEN IS NOT REPRESENTATIVE OF LOWER RESPIRATORY SECRETIONS. PLEASE RECOLLECT. Gram Stain Report Called to,Read Back By and Verified With: A BAKER,RN @0722  08/07/16 MKELLY,MLT    Report Status 08/07/2016 FINAL  Final       Radiology Studies: Dg Chest Port 1 View  Result Date: 08/07/2016 CLINICAL DATA:  Status post dialysis catheter insertion. EXAM: PORTABLE CHEST 1 VIEW COMPARISON:  08/04/2016. FINDINGS: Interval right jugular catheter with its tip at the superior cavoatrial junction. No pneumothorax. Borderline enlarged cardiac silhouette with prominent pulmonary vasculature. Clear lungs. Unremarkable bones. IMPRESSION: 1. Right jugular catheter tip at the superior cavoatrial junction without pneumothorax. 2. Pulmonary vascular congestion and borderline cardiomegaly. Electronically Signed   By: Claudie Revering M.D.   On: 08/07/2016 21:22      Scheduled Meds: . amLODipine  10 mg Oral Daily  . calcitRIOL  0.25 mcg Oral Daily  . carvedilol  6.25 mg Oral BID WC  . darbepoetin (ARANESP) injection - NON-DIALYSIS  40 mcg Subcutaneous Q Tue-1800  . folic acid  1 mg Oral Daily  . multivitamin with minerals  1 tablet Oral Daily  . nicotine  14 mg Transdermal Daily  . polyethylene glycol  17 g Oral BID  . sodium chloride flush  3 mL Intravenous Q12H  . thiamine  100 mg Oral Daily   Or  . thiamine  100 mg Intravenous Daily   Continuous Infusions: . sodium chloride 10  mL/hr at 08/07/16 1320  . sodium chloride       LOS: 4 days    Time spent: 40 minutes   Dessa Phi, DO [facility] [email or web address] Password [email or web address] 08/08/2016, 11:09 AM

## 2016-08-09 LAB — CBC WITH DIFFERENTIAL/PLATELET
Basophils Absolute: 0 10*3/uL (ref 0.0–0.1)
Basophils Relative: 0 %
Eosinophils Absolute: 0.8 10*3/uL — ABNORMAL HIGH (ref 0.0–0.7)
Eosinophils Relative: 10 %
HCT: 26.3 % — ABNORMAL LOW (ref 39.0–52.0)
Hemoglobin: 8.3 g/dL — ABNORMAL LOW (ref 13.0–17.0)
Lymphocytes Relative: 21 %
Lymphs Abs: 1.7 10*3/uL (ref 0.7–4.0)
MCH: 28.8 pg (ref 26.0–34.0)
MCHC: 31.6 g/dL (ref 30.0–36.0)
MCV: 91.3 fL (ref 78.0–100.0)
Monocytes Absolute: 0.8 10*3/uL (ref 0.1–1.0)
Monocytes Relative: 10 %
Neutro Abs: 4.6 10*3/uL (ref 1.7–7.7)
Neutrophils Relative %: 59 %
Platelets: 131 10*3/uL — ABNORMAL LOW (ref 150–400)
RBC: 2.88 MIL/uL — ABNORMAL LOW (ref 4.22–5.81)
RDW: 17.5 % — ABNORMAL HIGH (ref 11.5–15.5)
WBC: 7.9 10*3/uL (ref 4.0–10.5)

## 2016-08-09 LAB — BASIC METABOLIC PANEL
Anion gap: 10 (ref 5–15)
BUN: 47 mg/dL — ABNORMAL HIGH (ref 6–20)
CO2: 21 mmol/L — ABNORMAL LOW (ref 22–32)
Calcium: 9 mg/dL (ref 8.9–10.3)
Chloride: 109 mmol/L (ref 101–111)
Creatinine, Ser: 9.47 mg/dL — ABNORMAL HIGH (ref 0.61–1.24)
GFR calc Af Amer: 7 mL/min — ABNORMAL LOW (ref >60)
GFR calc non Af Amer: 6 mL/min — ABNORMAL LOW (ref >60)
Glucose, Bld: 89 mg/dL (ref 65–99)
Potassium: 4.2 mmol/L (ref 3.5–5.1)
Sodium: 140 mmol/L (ref 135–145)

## 2016-08-09 LAB — HEPATITIS B SURFACE ANTIGEN: Hepatitis B Surface Ag: NEGATIVE

## 2016-08-09 LAB — HEPATITIS B SURFACE ANTIBODY,QUALITATIVE: Hep B S Ab: REACTIVE

## 2016-08-09 LAB — HEPATITIS B CORE ANTIBODY, TOTAL: Hep B Core Total Ab: POSITIVE — AB

## 2016-08-09 MED ORDER — DARBEPOETIN ALFA 40 MCG/0.4ML IJ SOSY
40.0000 ug | PREFILLED_SYRINGE | INTRAMUSCULAR | Status: DC
Start: 2016-08-12 — End: 2016-08-12
  Administered 2016-08-12: 40 ug via INTRAVENOUS
  Filled 2016-08-09: qty 0.4

## 2016-08-09 NOTE — Progress Notes (Signed)
Jordan [name] 9:18 AM  Subjective: Patient seen and examined and case discussed with my partner Dr. B and hospital computer chart reviewed and he has no GI complaints and has not seen any blood and did okay with dialysis yesterday and we discussed colonoscopy and endoscopy and he is willing to have that done and will plan on Monday  Objective: Vital signs stable afebrile no acute distress lungs are clear decreased heart sounds abdomen is soft nontender good bowel sounds labs reviewed hemoglobin stable  Assessment: Guaiac positive anemia in patient without previous GI workup  Plan: The risks benefits methods of colonoscopy and endoscopy was discussed with the patient and he is willing to have the procedure and if okay tomorrow we will proceed on Monday and we discussed the prep as well  [facility] E  Pager [phone] After 5PM or if no answer call [phone]

## 2016-08-09 NOTE — Progress Notes (Signed)
PROGRESS NOTE    Jordan [name]  BTD:[id number] DOB: Jun 15, 1966 DOA: 08/04/2016 PCP: No PCP Per Patient   Brief Narrative:  Jordan [name] is a 50 y.o. male not seen by a PCP in several years, with a history of tobacco abuse, presenting to the ED with several day history of progressive shortness of breath with wheezing waking him out from his sleep. Dyspnea is worse with exertion, cannot walk more than one block before experiencing these symptoms. He denies any hemoptysis. He denies any gross blood in the stools. Patient's hemoglobin on admission was 5.1. He received 2 units of packed red blood cells. He has never had a colonoscopy. GI was consulted as well as Nephrology for AKI.    Assessment & Plan:   Principal Problem:   Symptomatic anemia Active Problems:   Tobacco abuse   ETOH abuse   Uncontrolled hypertension   Acute renal failure (ARF) (HCC)   Hypertensive emergency  Symptomatic anemia, combination of iron deficiency and blood loss anemia  -Received 2 units of packed red blood cells on 11/13  -Hemoccult positive  -Started on iron supplementation  -GI following: EGD and colonoscopy prior to discharge, tentatively Monday  -Hgb stabilized at 8   New ESRD  -Nephrology following: possibly renal failure due to ischemia in setting of symptomatic anemia with underlying hypertensive nephrosclerosis. Extensive workup including HIV, hep B/C, RPR negative. A1c only at 5.3. SPEP without any M spike. No hydronephrosis seen on renal ultrasound.  -Left upper extremity fistula placed 11/16. Need follow up 4-6 weeks with duplex to check maturation -Right IJ tunneled dialysis catheter placed 11/16  -HD per Nephrology  -CLIP pending   Uncontrolled hypertension, improved  -Norvasc and coreg   -No ACE/ARB -Hydralazine and labetalol as needed to maintain SBP less than 180  Alcohol abuse -Initially treated with CIWA, now off   Tobacco abuse -Nicotine patch -Cessation counseling    DVT prophylaxis: SCDs Code Status: Full Family Communication: no family at bedside Disposition Plan: pending further improvement    Consultants:   GI  Nephrology  Procedures:   None  Antimicrobials:   None     Subjective: Patient doing well today. No complaints.   Objective: Vitals:   08/08/16 1547 08/08/16 2113 08/09/16 0254 08/09/16 0503  BP: (!) 143/83 134/80  118/67  Pulse: 97 97  88  Resp: (!) 24 19  19   Temp: 99 F (37.2 C) 98.4 F (36.9 C)  98.4 F (36.9 C)  TempSrc: Oral     SpO2:  99%  96%  Weight: 68.2 kg (150 lb 5.7 oz)  68.4 kg (150 lb 12.7 oz)   Height:        Intake/Output Summary (Last 24 hours) at 08/09/16 0740 Last data filed at 08/09/16 0648  Gross per 24 hour  Intake            616.5 ml  Output             -112 ml  Net            728.5 ml   Filed Weights   08/08/16 1358 08/08/16 1547 08/09/16 0254  Weight: 68.2 kg (150 lb 5.7 oz) 68.2 kg (150 lb 5.7 oz) 68.4 kg (150 lb 12.7 oz)    Examination:  General exam: Appears calm and comfortable  Respiratory system: Clear to auscultation. Respiratory effort normal. Cardiovascular system: S1 & S2 heard, RRR, systolic murmur. No pedal edema. Gastrointestinal system: Abdomen is nondistended, soft and nontender. Normal  bowel sounds heard. Central nervous system: Alert and oriented. No focal neurological deficits. Extremities: Symmetric 5 x 5 power. Left upper extremity with fistula in place, palpable thrill Skin: No rashes, lesions or ulcers, Right chest with tunneled catheter in place  Psychiatry: Judgement and insight appear normal. Mood & affect appropriate.   Data Reviewed: I have personally reviewed following labs and imaging studies  CBC:  Recent Labs Lab 08/04/16 0253  08/05/16 0609 08/06/16 0706 08/07/16 0630 08/08/16 0721 08/09/16 0537  WBC 8.0  --  9.2 6.6 7.8 8.2 7.9  NEUTROABS 5.6  --   --  3.9 4.9 5.3 4.6  HGB 5.3*  < > 8.0* 7.5* 7.6* 8.9* 8.3*  HCT 16.2*  < > 23.7*  22.9* 23.2* 27.6* 26.3*  MCV 96.4  --  91.5 92.7 92.8 91.4 91.3  PLT 158  --  166 162 179 148* 131*  < > = values in this interval not displayed. Basic Metabolic Panel:  Recent Labs Lab 08/05/16 0609 08/05/16 1234 08/06/16 0706 08/07/16 0630 08/08/16 0721 08/09/16 0537  NA 141  --  139 139 138 140  K 4.8  --  5.0 5.0 4.9 4.2  CL 115*  --  111 112* 112* 109  CO2 16*  --  17* 16* 14* 21*  GLUCOSE 92  --  98 85 87 89  BUN 69*  --  69* 67* 64* 47*  CREATININE 11.98*  --  12.08* 11.85* 11.59* 9.47*  CALCIUM 9.4  --  9.2 9.2 8.6* 9.0  PHOS  --  4.9*  --   --   --   --    GFR: Estimated Creatinine Clearance: 8 mL/min (by C-G formula based on SCr of 9.47 mg/dL (H)). Liver Function Tests:  Recent Labs Lab 08/04/16 0253 08/05/16 0609  AST 22 15  ALT 20 16*  ALKPHOS 78 82  BILITOT 0.4 0.9  PROT 6.4* 6.6  ALBUMIN 3.2* 3.1*   No results for input(s): LIPASE, AMYLASE in the last 168 hours. No results for input(s): AMMONIA in the last 168 hours. Coagulation Profile: No results for input(s): INR, PROTIME in the last 168 hours. Cardiac Enzymes: No results for input(s): CKTOTAL, CKMB, CKMBINDEX, TROPONINI in the last 168 hours. BNP (last 3 results) No results for input(s): PROBNP in the last 8760 hours. HbA1C: No results for input(s): HGBA1C in the last 72 hours. CBG: No results for input(s): GLUCAP in the last 168 hours. Lipid Profile: No results for input(s): CHOL, HDL, LDLCALC, TRIG, CHOLHDL, LDLDIRECT in the last 72 hours. Thyroid Function Tests: No results for input(s): TSH, T4TOTAL, FREET4, T3FREE, THYROIDAB in the last 72 hours. Anemia Panel: No results for input(s): VITAMINB12, FOLATE, FERRITIN, TIBC, IRON, RETICCTPCT in the last 72 hours. Sepsis Labs: No results for input(s): PROCALCITON, LATICACIDVEN in the last 168 hours.  Recent Results (from the past 240 hour(s))  Urine culture     Status: None   Collection Time: 08/04/16 10:17 AM  Result Value Ref Range  Status   Specimen Description URINE, CLEAN CATCH  Final   Special Requests NONE  Final   Culture NO GROWTH  Final   Report Status 08/05/2016 FINAL  Final  Culture, sputum-assessment     Status: None   Collection Time: 08/07/16  5:17 AM  Result Value Ref Range Status   Specimen Description EXPECTORATED SPUTUM  Final   Special Requests NONE  Final   Sputum evaluation   Final    MICROSCOPIC FINDINGS SUGGEST THAT THIS SPECIMEN  IS NOT REPRESENTATIVE OF LOWER RESPIRATORY SECRETIONS. PLEASE RECOLLECT. Gram Stain Report Called to,Read Back By and Verified With: A BAKER,RN @0722  08/07/16 MKELLY,MLT    Report Status 08/07/2016 FINAL  Final       Radiology Studies: Dg Chest Port 1 View  Result Date: 08/07/2016 CLINICAL DATA:  Status post dialysis catheter insertion. EXAM: PORTABLE CHEST 1 VIEW COMPARISON:  08/04/2016. FINDINGS: Interval right jugular catheter with its tip at the superior cavoatrial junction. No pneumothorax. Borderline enlarged cardiac silhouette with prominent pulmonary vasculature. Clear lungs. Unremarkable bones. IMPRESSION: 1. Right jugular catheter tip at the superior cavoatrial junction without pneumothorax. 2. Pulmonary vascular congestion and borderline cardiomegaly. Electronically Signed   By: Claudie Revering M.D.   On: 08/07/2016 21:22      Scheduled Meds: . amLODipine  10 mg Oral Daily  . calcitRIOL  0.25 mcg Oral Daily  . carvedilol  6.25 mg Oral BID WC  . darbepoetin (ARANESP) injection - NON-DIALYSIS  40 mcg Subcutaneous Q Tue-1800  . folic acid  1 mg Oral Daily  . multivitamin with minerals  1 tablet Oral Daily  . nicotine  14 mg Transdermal Daily  . polyethylene glycol  17 g Oral BID  . sodium chloride flush  3 mL Intravenous Q12H  . thiamine  100 mg Oral Daily   Or  . thiamine  100 mg Intravenous Daily  . tuberculin  5 Units Intradermal Once   Continuous Infusions: . sodium chloride 10 mL/hr at 08/07/16 1320  . sodium chloride       LOS: 5 days     Time spent: 30 minutes   Dessa Phi, DO [facility] [email or web address] Password [email or web address] 08/09/2016, 7:40 AM

## 2016-08-09 NOTE — Progress Notes (Signed)
[facility] Progress Note    Assessment/ Plan:   1. 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  Hep B/C, HIV, RPR all negative.  SPEP negative; HgbA1c 5.3--> but has some glucose on Ua, Kappa/ Lambda light chains with ratio 1.37.  UP/C with ~ 2 g protein.  ANA + 1:80 (weakly). Renal U/S showing very echogenic kidneys without hydro.  Creatinine is 12 and seems to have plateaued.  New ESRD. - hepatitis studies already complete - HIV complete - needs PPD- have placed order (11/17) - CXR without cavitary lung lesions (11/13) -CLIP  2.  Symptomatic anemia: Tsat 7, getting ferraheme and 1u pRBCs.  Chronic component but some is likely acute based on history of dark stools.  starting Aranesp 40 q week (11/14-) . Have now ordered it with dialysis. Hgb goal 10-11.    3.  Dark tarry stools: GI consult, planning for scopes closer to d/c and until after HD initiated--> likely Monday.  4.  HTN: amlodipine 10 mg daily Coreg 6.25 mg BID.  Will uptitrate.  No ACEi/ARB. BP improving--> maybe some was EtOH withdrawal?   4.  EtOH abuse: no tremor today, has Ativan if needed (CIWA protocol)  5.  Tobacco abuse: nicotine patch.  6.  Bone/mineral: PTH 455, Phos 4.9, no binder needed now, starting Calcitriol 0.25 mcg daily.  Vit D 19.6  Subjective:    For HD #2 today.     Objective:   BP 118/67 (BP Location: Right Arm)   Pulse 88   Temp 98.4 F (36.9 C)   Resp 19   Ht 5\' 4"  (1.626 m)   Wt 68.4 kg (150 lb 12.7 oz)   SpO2 96%   BMI 25.88 kg/m   Intake/Output Summary (Last 24 hours) at 08/09/16 1340 Last data filed at 08/09/16 0648  Gross per 24 hour  Intake            256.5 ml  Output             -112 ml  Net            368.5 ml   Weight change: 0.024 kg (0.9 oz)  Physical Exam: GEN: NAD, sitting in bed, talking on phone HEENT: EOMI, PERRL, sclerae muddy NECK: Supple, no JVD or  bruit PULM: CTAB no c/w/r CV : tachcardic, II/VI systolic murmur, no r/g ABD: soft, nondistended, nontender, +liver edge 3 cm below costal margin.  Spleen not appreciated EXT: no LE edema NEURO: AAO x 3, no asterixis, no tremor MSK: no synovitis SKIN: no rashes or lesions, mult tattoos ACESS: R IJ TDC, LUE AVF with good bruit and thrill  Imaging: Dg Chest Port 1 View  Result Date: 08/07/2016 CLINICAL DATA:  Status post dialysis catheter insertion. EXAM: PORTABLE CHEST 1 VIEW COMPARISON:  08/04/2016. FINDINGS: Interval right jugular catheter with its tip at the superior cavoatrial junction. No pneumothorax. Borderline enlarged cardiac silhouette with prominent pulmonary vasculature. Clear lungs. Unremarkable bones. IMPRESSION: 1. Right jugular catheter tip at the superior cavoatrial junction without pneumothorax. 2. Pulmonary vascular congestion and borderline cardiomegaly. Electronically Signed   By: Claudie Revering M.D.   On: 08/07/2016 21:22    Labs: BMET  Recent Labs Lab 08/04/16 0305 08/05/16 0609 08/05/16 1234 08/06/16 0706 08/07/16 0630 08/08/16 0721 08/09/16 0537  NA 140 141  --  139 139 138 140  K 4.6 4.8  --  5.0 5.0 4.9 4.2  CL  113* 115*  --  111 112* 112* 109  CO2  --  16*  --  17* 16* 14* 21*  GLUCOSE 92 92  --  98 85 87 89  BUN 88* 69*  --  69* 67* 64* 47*  CREATININE 14.40* 11.98*  --  12.08* 11.85* 11.59* 9.47*  CALCIUM  --  9.4  --  9.2 9.2 8.6* 9.0  PHOS  --   --  4.9*  --   --   --   --    CBC  Recent Labs Lab 08/06/16 0706 08/07/16 0630 08/08/16 0721 08/09/16 0537  WBC 6.6 7.8 8.2 7.9  NEUTROABS 3.9 4.9 5.3 4.6  HGB 7.5* 7.6* 8.9* 8.3*  HCT 22.9* 23.2* 27.6* 26.3*  MCV 92.7 92.8 91.4 91.3  PLT 162 179 148* 131*    Medications:    . amLODipine  10 mg Oral Daily  . calcitRIOL  0.25 mcg Oral Daily  . carvedilol  6.25 mg Oral BID WC  . darbepoetin (ARANESP) injection - NON-DIALYSIS  40 mcg Subcutaneous Q Tue-1800  . folic acid  1 mg Oral Daily   . multivitamin with minerals  1 tablet Oral Daily  . nicotine  14 mg Transdermal Daily  . polyethylene glycol  17 g Oral BID  . sodium chloride flush  3 mL Intravenous Q12H  . thiamine  100 mg Oral Daily   Or  . thiamine  100 mg Intravenous Daily  . tuberculin  5 Units Intradermal Once     Madelon Lips MD [facility] Cell [phone] pgr [phone] 08/09/2016, 1:40 PM

## 2016-08-10 LAB — CBC WITH DIFFERENTIAL/PLATELET
Basophils Absolute: 0 10*3/uL (ref 0.0–0.1)
Basophils Relative: 0 %
Eosinophils Absolute: 0.7 10*3/uL (ref 0.0–0.7)
Eosinophils Relative: 9 %
HCT: 26.2 % — ABNORMAL LOW (ref 39.0–52.0)
Hemoglobin: 8.5 g/dL — ABNORMAL LOW (ref 13.0–17.0)
Lymphocytes Relative: 14 %
Lymphs Abs: 1.1 10*3/uL (ref 0.7–4.0)
MCH: 29.3 pg (ref 26.0–34.0)
MCHC: 32.4 g/dL (ref 30.0–36.0)
MCV: 90.3 fL (ref 78.0–100.0)
Monocytes Absolute: 0.7 10*3/uL (ref 0.1–1.0)
Monocytes Relative: 8 %
Neutro Abs: 5.6 10*3/uL (ref 1.7–7.7)
Neutrophils Relative %: 69 %
Platelets: 122 10*3/uL — ABNORMAL LOW (ref 150–400)
RBC: 2.9 MIL/uL — ABNORMAL LOW (ref 4.22–5.81)
RDW: 17 % — ABNORMAL HIGH (ref 11.5–15.5)
WBC: 8.1 10*3/uL (ref 4.0–10.5)

## 2016-08-10 MED ORDER — ACETAMINOPHEN 325 MG PO TABS
ORAL_TABLET | ORAL | Status: AC
  Filled 2016-08-10: qty 2

## 2016-08-10 MED ORDER — ACETAMINOPHEN 325 MG PO TABS
ORAL_TABLET | ORAL | Status: AC
  Administered 2016-08-10: 650 mg via ORAL
  Filled 2016-08-10: qty 2

## 2016-08-10 MED ORDER — SODIUM CHLORIDE 0.9 % IV SOLN
INTRAVENOUS | Status: DC
  Administered 2016-08-10: 10 mL/h via INTRAVENOUS

## 2016-08-10 MED ORDER — PEG 3350-KCL-NA BICARB-NACL 420 G PO SOLR
4000.0000 mL | Freq: Once | ORAL | Status: AC
  Administered 2016-08-10: 4000 mL via ORAL
  Filled 2016-08-10: qty 4000

## 2016-08-10 NOTE — Progress Notes (Signed)
PROGRESS NOTE    DEACON GADBOIS  HUD:[id number] DOB: March 01, 1966 DOA: 08/04/2016 PCP: No PCP Per Patient   Brief Narrative:  Jordan [name] is a 50 y.o. male not seen by a PCP in several years, with a history of tobacco abuse, presenting to the ED with several day history of progressive shortness of breath with wheezing waking him out from his sleep. Dyspnea is worse with exertion, cannot walk more than one block before experiencing these symptoms. He denies any hemoptysis. He denies any gross blood in the stools. Patient's hemoglobin on admission was 5.1. He received 2 units of packed red blood cells. He has never had a colonoscopy. GI was consulted as well as Nephrology for AKI.    Assessment & Plan:   Principal Problem:   Symptomatic anemia Active Problems:   Tobacco abuse   ETOH abuse   Uncontrolled hypertension   Acute renal failure (ARF) (HCC)   Hypertensive emergency  Symptomatic anemia, combination of iron deficiency and blood loss anemia  -Received 2 units of packed red blood cells on 11/13  -Hemoccult positive  -Started on iron supplementation  -Hgb stabilized at 8  -GI following: EGD and colonoscopy scheduled for 11/20 8am   New ESRD  -Nephrology following: possibly renal failure due to ischemia in setting of symptomatic anemia with underlying hypertensive nephrosclerosis. Extensive workup including HIV, hep B/C, RPR negative. A1c only at 5.3. SPEP without any M spike. No hydronephrosis seen on renal ultrasound.  -Left upper extremity fistula placed 11/16. Need follow up 4-6 weeks with duplex to check maturation -Right IJ tunneled dialysis catheter placed 11/16  -HD per Nephrology  -CLIP pending   Uncontrolled hypertension, improved  -Norvasc and coreg   -No ACE/ARB -Hydralazine and labetalol as needed to maintain SBP less than 180  Alcohol abuse -Initially treated with CIWA, now off   Tobacco abuse -Nicotine patch -Cessation counseling   DVT prophylaxis:  SCDs Code Status: Full Family Communication: no family at bedside Disposition Plan: pending further work up and improvement, likely back home    Consultants:   GI  Nephrology  Procedures:   None  Antimicrobials:   None     Subjective: Patient doing well today. No complaints.   Objective: Vitals:   08/10/16 0300 08/10/16 0330 08/10/16 0343 08/10/16 0534  BP:  (!) 143/86 140/80 130/62  Pulse: 98 90 100 71  Resp:   20 18  Temp:   98 F (36.7 C) 99.4 F (37.4 C)  TempSrc:   Oral Oral  SpO2:   100% 100%  Weight:   67.9 kg (149 lb 11.1 oz)   Height:        Intake/Output Summary (Last 24 hours) at 08/10/16 0732 Last data filed at 08/10/16 0500  Gross per 24 hour  Intake           117.33 ml  Output                0 ml  Net           117.33 ml   Filed Weights   08/09/16 0254 08/10/16 0043 08/10/16 0343  Weight: 68.4 kg (150 lb 12.7 oz) 67.9 kg (149 lb 11.1 oz) 67.9 kg (149 lb 11.1 oz)    Examination:  General exam: Appears calm and comfortable  Respiratory system: Clear to auscultation. Respiratory effort normal. Cardiovascular system: S1 & S2 heard, RRR, systolic murmur. No pedal edema. Gastrointestinal system: Abdomen is nondistended, soft and nontender. Normal bowel sounds heard.  Central nervous system: Alert and oriented. No focal neurological deficits. Extremities: Symmetric 5 x 5 power. Left upper extremity with fistula in place, palpable thrill Skin: No rashes, lesions or ulcers, Right chest with tunneled catheter in place  Psychiatry: Judgement and insight appear normal. Mood & affect appropriate.   Data Reviewed: I have personally reviewed following labs and imaging studies  CBC:  Recent Labs Lab 08/06/16 0706 08/07/16 0630 08/08/16 0721 08/09/16 0537 08/10/16 0632  WBC 6.6 7.8 8.2 7.9 8.1  NEUTROABS 3.9 4.9 5.3 4.6 5.6  HGB 7.5* 7.6* 8.9* 8.3* 8.5*  HCT 22.9* 23.2* 27.6* 26.3* 26.2*  MCV 92.7 92.8 91.4 91.3 90.3  PLT 162 179 148* 131* 122*    Basic Metabolic Panel:  Recent Labs Lab 08/05/16 0609 08/05/16 1234 08/06/16 0706 08/07/16 0630 08/08/16 0721 08/09/16 0537  NA 141  --  139 139 138 140  K 4.8  --  5.0 5.0 4.9 4.2  CL 115*  --  111 112* 112* 109  CO2 16*  --  17* 16* 14* 21*  GLUCOSE 92  --  98 85 87 89  BUN 69*  --  69* 67* 64* 47*  CREATININE 11.98*  --  12.08* 11.85* 11.59* 9.47*  CALCIUM 9.4  --  9.2 9.2 8.6* 9.0  PHOS  --  4.9*  --   --   --   --    GFR: Estimated Creatinine Clearance: 8 mL/min (by C-G formula based on SCr of 9.47 mg/dL (H)). Liver Function Tests:  Recent Labs Lab 08/04/16 0253 08/05/16 0609  AST 22 15  ALT 20 16*  ALKPHOS 78 82  BILITOT 0.4 0.9  PROT 6.4* 6.6  ALBUMIN 3.2* 3.1*   No results for input(s): LIPASE, AMYLASE in the last 168 hours. No results for input(s): AMMONIA in the last 168 hours. Coagulation Profile: No results for input(s): INR, PROTIME in the last 168 hours. Cardiac Enzymes: No results for input(s): CKTOTAL, CKMB, CKMBINDEX, TROPONINI in the last 168 hours. BNP (last 3 results) No results for input(s): PROBNP in the last 8760 hours. HbA1C: No results for input(s): HGBA1C in the last 72 hours. CBG: No results for input(s): GLUCAP in the last 168 hours. Lipid Profile: No results for input(s): CHOL, HDL, LDLCALC, TRIG, CHOLHDL, LDLDIRECT in the last 72 hours. Thyroid Function Tests: No results for input(s): TSH, T4TOTAL, FREET4, T3FREE, THYROIDAB in the last 72 hours. Anemia Panel: No results for input(s): VITAMINB12, FOLATE, FERRITIN, TIBC, IRON, RETICCTPCT in the last 72 hours. Sepsis Labs: No results for input(s): PROCALCITON, LATICACIDVEN in the last 168 hours.  Recent Results (from the past 240 hour(s))  Urine culture     Status: None   Collection Time: 08/04/16 10:17 AM  Result Value Ref Range Status   Specimen Description URINE, CLEAN CATCH  Final   Special Requests NONE  Final   Culture NO GROWTH  Final   Report Status 08/05/2016 FINAL   Final  Culture, sputum-assessment     Status: None   Collection Time: 08/07/16  5:17 AM  Result Value Ref Range Status   Specimen Description EXPECTORATED SPUTUM  Final   Special Requests NONE  Final   Sputum evaluation   Final    MICROSCOPIC FINDINGS SUGGEST THAT THIS SPECIMEN IS NOT REPRESENTATIVE OF LOWER RESPIRATORY SECRETIONS. PLEASE RECOLLECT. Gram Stain Report Called to,Read Back By and Verified With: A BAKER,RN @0722  08/07/16 MKELLY,MLT    Report Status 08/07/2016 FINAL  Final       Radiology  Studies: No results found.    Scheduled Meds: . acetaminophen      . amLODipine  10 mg Oral Daily  . calcitRIOL  0.25 mcg Oral Daily  . carvedilol  6.25 mg Oral BID WC  . [START ON 08/12/2016] darbepoetin (ARANESP) injection - DIALYSIS  40 mcg Intravenous Q Tue-HD  . folic acid  1 mg Oral Daily  . multivitamin with minerals  1 tablet Oral Daily  . nicotine  14 mg Transdermal Daily  . polyethylene glycol  17 g Oral BID  . sodium chloride flush  3 mL Intravenous Q12H  . thiamine  100 mg Oral Daily   Or  . thiamine  100 mg Intravenous Daily  . tuberculin  5 Units Intradermal Once   Continuous Infusions: . sodium chloride 10 mL/hr at 08/07/16 1320  . sodium chloride       LOS: 6 days    Time spent: 30 minutes   Dessa Phi, DO [facility] [email or web address] Password [email or web address] 08/10/2016, 7:32 AM

## 2016-08-10 NOTE — Progress Notes (Signed)
TB skin test placed in [facility] on 08/08/16 Read @ 48 hours. 65mm Negative result.

## 2016-08-10 NOTE — Progress Notes (Signed)
[facility] Progress Note    Assessment/ Plan:   1. 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  Hep B/C, HIV, RPR all negative.  SPEP negative; HgbA1c 5.3--> but has some glucose on Ua, Kappa/ Lambda light chains with ratio 1.37.  UP/C with ~ 2 g protein.  ANA + 1:80 (weakly). Renal U/S showing very echogenic kidneys without hydro.  Creatinine is 12 and seems to have plateaued.  New ESRD. - hepatitis studies already complete - HIV complete - needs PPD- have placed order (11/17), needs to be read today 11/19 after 5:30 pm - CXR without cavitary lung lesions (11/13) -CLIP  2.  Symptomatic anemia: Tsat 7, getting ferraheme and 1u pRBCs.  Chronic component but some is likely acute based on history of dark stools.  starting Aranesp 40 q week (11/14-) . Have now ordered it with dialysis. Hgb goal 10-11.    3.  Dark tarry stools: GI consult, planning for scopes closer to d/c and until after HD initiated--> likely Monday.  4.  HTN: amlodipine 10 mg daily Coreg 6.25 mg BID.  Will uptitrate.  No ACEi/ARB. BP improving--> maybe some was EtOH withdrawal?   4.  EtOH abuse: no tremor today, has Ativan if needed (CIWA protocol)  5.  Tobacco abuse: nicotine patch.  6.  Bone/mineral: PTH 455, Phos 4.9, no binder needed now, starting Calcitriol 0.25 mcg daily.  Vit D 19.6  Subjective:    For HD #3 today.     Objective:   BP 119/65   Pulse 96   Temp 97 F (36.1 C) (Oral)   Resp 18   Ht 5\' 4"  (1.626 m)   Wt 67.9 kg (149 lb 11.1 oz)   SpO2 98%   BMI 25.69 kg/m   Intake/Output Summary (Last 24 hours) at 08/10/16 1442 Last data filed at 08/10/16 0500  Gross per 24 hour  Intake           117.33 ml  Output                0 ml  Net           117.33 ml   Weight change: -0.3 kg (-10.6 oz)  Physical Exam: GEN: NAD, sitting in bed HEENT: EOMI, PERRL, sclerae muddy NECK: Supple, no JVD or  bruit PULM: CTAB no c/w/r CV : tachcardic, II/VI systolic murmur, no r/g ABD: soft, nondistended, nontender, +liver edge 3 cm below costal margin.  Spleen not appreciated EXT: no LE edema NEURO: AAO x 3, no asterixis, no tremor MSK: no synovitis SKIN: no rashes or lesions, mult tattoos ACESS: R IJ TDC, LUE AVF with good bruit and thrill  Imaging: No results found.  Labs: BMET  Recent Labs Lab 08/04/16 0305 08/05/16 0609 08/05/16 1234 08/06/16 0706 08/07/16 0630 08/08/16 0721 08/09/16 0537 08/10/16 0632  NA 140 141  --  139 139 138 140 135  K 4.6 4.8  --  5.0 5.0 4.9 4.2 3.2*  CL 113* 115*  --  111 112* 112* 109 100*  CO2  --  16*  --  17* 16* 14* 21* 27  GLUCOSE 92 92  --  98 85 87 89 135*  BUN 88* 69*  --  69* 67* 64* 47* 22*  CREATININE 14.40* 11.98*  --  12.08* 11.85* 11.59* 9.47* 5.76*  CALCIUM  --  9.4  --  9.2 9.2 8.6* 9.0 8.8*  PHOS  --   --  4.9*  --   --   --   --   --    CBC  Recent Labs Lab 08/07/16 0630 08/08/16 0721 08/09/16 0537 08/10/16 0632  WBC 7.8 8.2 7.9 8.1  NEUTROABS 4.9 5.3 4.6 5.6  HGB 7.6* 8.9* 8.3* 8.5*  HCT 23.2* 27.6* 26.3* 26.2*  MCV 92.8 91.4 91.3 90.3  PLT 179 148* 131* 122*    Medications:    . acetaminophen      . amLODipine  10 mg Oral Daily  . calcitRIOL  0.25 mcg Oral Daily  . carvedilol  6.25 mg Oral BID WC  . [START ON 08/12/2016] darbepoetin (ARANESP) injection - DIALYSIS  40 mcg Intravenous Q Tue-HD  . folic acid  1 mg Oral Daily  . multivitamin with minerals  1 tablet Oral Daily  . nicotine  14 mg Transdermal Daily  . polyethylene glycol  17 g Oral BID  . polyethylene glycol-electrolytes  4,000 mL Oral Once  . sodium chloride flush  3 mL Intravenous Q12H  . thiamine  100 mg Oral Daily   Or  . thiamine  100 mg Intravenous Daily  . tuberculin  5 Units Intradermal Once     Madelon Lips MD [facility] Cell [phone] pgr [phone] 08/10/2016, 2:42 PM

## 2016-08-10 NOTE — Progress Notes (Signed)
Report given to dialysis nurse. Pt to transport to dialysis before lunch.

## 2016-08-10 NOTE — Anesthesia Preprocedure Evaluation (Addendum)
Anesthesia Evaluation  Patient identified by MRN, date of birth, ID band Patient awake    Reviewed: Allergy & Precautions, H&P , NPO status , Patient's Chart, lab work & pertinent test results  Airway Mallampati: II   Neck ROM: full    Dental no notable dental hx.    Pulmonary Current Smoker,    Pulmonary exam normal breath sounds clear to auscultation       Cardiovascular hypertension,  Rhythm:regular Rate:Normal     Neuro/Psych negative neurological ROS  negative psych ROS   GI/Hepatic Neg liver ROS, GI bleed   Endo/Other  negative endocrine ROS  Renal/GU Renal InsufficiencyRenal disease  negative genitourinary   Musculoskeletal   Abdominal   Peds  Hematology negative hematology ROS (+) anemia ,   Anesthesia Other Findings   Reproductive/Obstetrics negative OB ROS                            Anesthesia Physical Anesthesia Plan  ASA: III  Anesthesia Plan: MAC   Post-op Pain Management:    Induction: Intravenous  Airway Management Planned: Nasal Cannula  Additional Equipment:   Intra-op Plan:   Post-operative Plan:   Informed Consent: I have reviewed the patients History and Physical, chart, labs and discussed the procedure including the risks, benefits and alternatives for the proposed anesthesia with the patient or authorized representative who has indicated his/her understanding and acceptance.   Dental advisory given  Plan Discussed with: CRNA  Anesthesia Plan Comments:         Anesthesia Quick Evaluation

## 2016-08-10 NOTE — Procedures (Signed)
Patient seen and examined on Hemodialysis. QB 400 UF goal 500 mL Tolerating treatment well.  No clotting with addition of small dose of heparin.  Need to keep an eye on hemoglobins, they are stable but still need scopes Treatment adjusted as needed.  Madelon Lips MD [facility] Cell [phone] Pgr: [phone] 2:44 PM

## 2016-08-10 NOTE — Progress Notes (Signed)
Jordan [name] 10:34 AM  Subjective: Patient doing well without any new complaints and no GI complaints and we rediscussed his procedures and he is willing to proceed  Objective: Vital signs stable afebrile no acute distress abdomen is soft nontender labs stable  Assessment: Guaiac positive anemia  Plan: Will proceed with prep today clear liquids only and colonoscopy and endoscopy tomorrow at 8 AM by one of my partners  [facility] E  Pager [phone] After 5PM or if no answer call [phone]

## 2016-08-11 ENCOUNTER — Telehealth: Payer: Self-pay | Admitting: Vascular Surgery

## 2016-08-11 ENCOUNTER — Encounter (HOSPITAL_COMMUNITY): Payer: Self-pay | Admitting: Anesthesiology

## 2016-08-11 LAB — BASIC METABOLIC PANEL
Anion gap: 8 (ref 5–15)
Anion gap: 11 (ref 5–15)
BUN: 22 mg/dL — ABNORMAL HIGH (ref 6–20)
BUN: 14 mg/dL (ref 6–20)
CO2: 27 mmol/L (ref 22–32)
CO2: 28 mmol/L (ref 22–32)
Calcium: 8.8 mg/dL — ABNORMAL LOW (ref 8.9–10.3)
Calcium: 9.2 mg/dL (ref 8.9–10.3)
Chloride: 100 mmol/L — ABNORMAL LOW (ref 101–111)
Chloride: 97 mmol/L — ABNORMAL LOW (ref 101–111)
Creatinine, Ser: 5.76 mg/dL — ABNORMAL HIGH (ref 0.61–1.24)
Creatinine, Ser: 5.02 mg/dL — ABNORMAL HIGH (ref 0.61–1.24)
GFR calc Af Amer: 12 mL/min — ABNORMAL LOW (ref >60)
GFR calc Af Amer: 14 mL/min — ABNORMAL LOW (ref >60)
GFR calc non Af Amer: 11 mL/min — ABNORMAL LOW (ref >60)
GFR calc non Af Amer: 12 mL/min — ABNORMAL LOW (ref >60)
Glucose, Bld: 135 mg/dL — ABNORMAL HIGH (ref 65–99)
Glucose, Bld: 86 mg/dL (ref 65–99)
Potassium: 3.2 mmol/L — ABNORMAL LOW (ref 3.5–5.1)
Potassium: 3.6 mmol/L (ref 3.5–5.1)
Sodium: 135 mmol/L (ref 135–145)
Sodium: 136 mmol/L (ref 135–145)

## 2016-08-11 LAB — CBC WITH DIFFERENTIAL/PLATELET
Basophils Absolute: 0 10*3/uL (ref 0.0–0.1)
Basophils Relative: 0 %
Eosinophils Absolute: 0.7 10*3/uL (ref 0.0–0.7)
Eosinophils Relative: 8 %
HCT: 28.2 % — ABNORMAL LOW (ref 39.0–52.0)
Hemoglobin: 9.2 g/dL — ABNORMAL LOW (ref 13.0–17.0)
Lymphocytes Relative: 10 %
Lymphs Abs: 0.9 10*3/uL (ref 0.7–4.0)
MCH: 30.2 pg (ref 26.0–34.0)
MCHC: 32.6 g/dL (ref 30.0–36.0)
MCV: 92.5 fL (ref 78.0–100.0)
Monocytes Absolute: 1.3 10*3/uL — ABNORMAL HIGH (ref 0.1–1.0)
Monocytes Relative: 14 %
Neutro Abs: 5.9 10*3/uL (ref 1.7–7.7)
Neutrophils Relative %: 67 %
Platelets: 111 10*3/uL — ABNORMAL LOW (ref 150–400)
RBC: 3.05 MIL/uL — ABNORMAL LOW (ref 4.22–5.81)
RDW: 17.4 % — ABNORMAL HIGH (ref 11.5–15.5)
WBC: 8.8 10*3/uL (ref 4.0–10.5)

## 2016-08-11 NOTE — Progress Notes (Signed)
[facility] Progress Note  Jordan [name] 50 y.o. 07/14/1966  CC:   Anemia,    Subjective: Patient only finished 1/3 of the prep for colonoscopy. Not having clear BM. Denied any blood in the stool. Hgb stable.   ROS : negative for abdominal pain, nausea, vomiting.    Objective: Vital signs in last 24 hours: Vitals:   08/10/16 2226 08/11/16 0644  BP: 130/73 124/74  Pulse: 89 93  Resp: 17 16  Temp: 98.3 F (36.8 C) 99.6 F (37.6 C)    Physical Exam:  General:  Alert, cooperative, no distress, appears stated age  Head:  Normocephalic, without obvious abnormality, atraumatic  Eyes:  , EOM's intact,   Lungs:   Clear to auscultation bilaterally, respirations unlabored  Heart:  Regular rate and rhythm, S1, S2 normal  Abdomen:   Soft, non-tender, bowel sounds active all four quadrants,  no masses,   Extremities: Extremities normal, atraumatic, no  edema  Pulses: 2+ and symmetric    Lab Results:  Recent Labs  08/10/16 0632 08/11/16 0445  NA 135 136  K 3.2* 3.6  CL 100* 97*  CO2 27 28  GLUCOSE 135* 86  BUN 22* 14  CREATININE 5.76* 5.02*  CALCIUM 8.8* 9.2   No results for input(s): AST, ALT, ALKPHOS, BILITOT, PROT, ALBUMIN in the last 72 hours.  Recent Labs  08/10/16 0632 08/11/16 0445  WBC 8.1 8.8  NEUTROABS 5.6 5.9  HGB 8.5* 9.2*  HCT 26.2* 28.2*  MCV 90.3 92.5  PLT 122* 111*   No results for input(s): LABPROT, INR in the last 72 hours.    Assessment/Plan: - Anemia - Occult blood positive - Acute renal failure  Recommendations ------------------------- - Patient has not finished his prep for colonoscopy. Discussed in detail with the patient.  - We'll put him on clear liquid diet today. Tentative plan for EGD and colonoscopy tomorrow - No globin stable. No active overt bleeding. - GI Will follow   Otis Brace MD, FACP 08/11/2016, 8:25 AM  Pager [phone]  If no answer or after 5 PM call [phone]

## 2016-08-11 NOTE — Progress Notes (Signed)
CSW called by [facility] to assist with transportation resources for patient to attend dialysis at [facility] on [address].   CSW called dialysis center to ask about local transport companies- they referred CSW to [employer].  CSW called [employer] who states pt would be eligible for their services even though technically in [facility].  No application process but pt dialysis center would need to call with CLIP time/length of treatment in order to set up transport times-  Each ride would be $6 round trip.  CSW explained to pt and provided resources.  Pt also inquired about what he should do when he gets home since he doesn't feel as if he can work as much with dialysis and dialysis port- CSW informed about Medicaid application and SSI Disability application process- also left message for Financial Counselor to follow up with patient regarding this process.  CSW signing off- please reconsult if needed  Jorge Ny, [employer] Social Worker [phone]

## 2016-08-11 NOTE — Telephone Encounter (Signed)
Unable to leave vm on phone, states it it unavailable Will mail letter for appt on 12/27 w/US and f/u

## 2016-08-11 NOTE — Telephone Encounter (Signed)
-----   Message from Mena Goes, RN sent at 08/08/2016 12:45 PM EST ----- Regarding: schedule duplex and postop    ----- Message ----- From: Alvia Grove, PA-C Sent: 08/08/2016   8:46 AM To: Vvs Charge Pool  S/p left basilic vein transposition and TDC 08/07/16  F/u in 4-6 weeks with duplex  Thanks Maudie Mercury

## 2016-08-11 NOTE — Progress Notes (Signed)
Brief Education Follow-Up Note  RD received consult for renal diet education.   Noted pt received education on 08/08/16; refer to RD note for further details.   Spoke with pt at bedside, who reports he is anxious due to being on a clear liquid diet and having to consume bowel prep. He states he was eating well when on solid foods previously.   When probed, pt did recall speaking with dietetic intern on 08/08/16, however, he reports "I think she mostly talked to my daughter". Pt reports he prepares his own foods at home. Reviewed principles of renal diet with pt; reports he tries to follow a low sodium diet at home. Discussed specific ways he could decrease sodium in his diet. Also discussed high sources of potassium and phosphorus in diet diet. Pt was very upset about having to limit banana consumption, as he eats one daily to suppress hand cramps; RD discussed other ways he could limit other low potassium foods by substituting it with lower potassium and phosphorus fruits and vegetables. Also discussed that diet recommendations may change based upon lab levels. Teachback method used. Expect fair compliance.   NO further nutritional needs identified at this time. If further nutrition issues arise, please re-consult RD.   Jordan [name] A. Jimmye Norman, RD, LDN, CDE Pager: [phone] After hours Pager: [phone]

## 2016-08-11 NOTE — Progress Notes (Signed)
Pt. Reporting numbness in left hand (arm of recent fistula placement). Hand warm and good capillary refilled. MD notified. Will continue to monitor.

## 2016-08-11 NOTE — Progress Notes (Signed)
Assessment/ Plan:   1. 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  Hep B/C, HIV, RPR all negative. SPEP negative; HgbA1c 5.3--> but has some glucose on Ua, Kappa/ Lambda light chains with ratio 1.37. UP/C with ~ 2 g protein. ANA + 1:80 (weakly). Renal U/S showing very echogenic kidneys without hydro.  Creatinine is 12 and seems to have plateaued.  New ESRD. - hepatitis studies already complete - HIV complete - needs PPD- have placed order (11/17), needs to be read today 11/19 after 5:30 pm - CXR without cavitary lung lesions (11/13) -CLIP, HD in AM after GI procedure  2. Symptomatic anemia: Tsat 7, getting ferraheme and 1u pRBCs.  Chronic component but some is likely acute based on history of dark stools. starting Aranesp 40 q week (11/14-) . Have now ordered it with dialysis. Hgb goal 10-11.    3. Dark tarry stools: GI consult, colon. in AM  4. EtOH abuse: no tremor today, has Ativan if needed (CIWA protocol)  5. Tobacco abuse: nicotine patch.  6.  Bone/mineral: PTH 455, Phos 4.9, no binder needed now, starting Calcitriol 0.25 mcg daily.  Vit D 19.6  Subjective: Interval History: Feels better  Objective: Vital signs in last 24 hours: Temp:  [97.7 F (36.5 C)-99.6 F (37.6 C)] 99.6 F (37.6 C) (11/20 0644) Pulse Rate:  [88-98] 90 (11/20 0843) Resp:  [16-20] 16 (11/20 0644) BP: (113-151)/(65-89) 133/80 (11/20 0843) SpO2:  [99 %] 99 % (11/20 0644) Weight change: 0 kg (0 lb)  Intake/Output from previous day: 11/19 0701 - 11/20 0700 In: 131 [I.V.:131] Out: 711 [Urine:200] Intake/Output this shift: Total I/O In: 0  Out: 100 [Urine:100]  General appearance: alert and cooperative Resp: clear to auscultation bilaterally Cardio: regular rate and rhythm, S1, S2 normal, no murmur, click, rub or gallop Extremities: extremities normal, atraumatic, no cyanosis or edema and LUE AVF   CW  TDC on R  Lab Results:  Recent Labs  08/10/16 0632 08/11/16 0445  WBC 8.1 8.8  HGB 8.5* 9.2*  HCT 26.2* 28.2*  PLT 122* 111*   BMET:  Recent Labs  08/10/16 0632 08/11/16 0445  NA 135 136  K 3.2* 3.6  CL 100* 97*  CO2 27 28  GLUCOSE 135* 86  BUN 22* 14  CREATININE 5.76* 5.02*  CALCIUM 8.8* 9.2   No results for input(s): PTH in the last 72 hours. Iron Studies: No results for input(s): IRON, TIBC, TRANSFERRIN, FERRITIN in the last 72 hours. Studies/Results: No results found.  Scheduled: . amLODipine  10 mg Oral Daily  . calcitRIOL  0.25 mcg Oral Daily  . carvedilol  6.25 mg Oral BID WC  . [START ON 08/12/2016] darbepoetin (ARANESP) injection - DIALYSIS  40 mcg Intravenous Q Tue-HD  . folic acid  1 mg Oral Daily  . multivitamin with minerals  1 tablet Oral Daily  . nicotine  14 mg Transdermal Daily  . polyethylene glycol  17 g Oral BID  . sodium chloride flush  3 mL Intravenous Q12H  . thiamine  100 mg Oral Daily   Or  . thiamine  100 mg Intravenous Daily     LOS: 7 days   Olie Scaffidi C 08/11/2016,12:52 PM

## 2016-08-11 NOTE — Progress Notes (Signed)
Spoke with patient primary RN and patient directly regarding bowel prep for colonoscopy today. Patient has drank about 1/3 of 4L prep, he is having liquid stools with hard formed stool mixed in. Patient is scheduled for EGD and colonoscopy at 0800 today. Call placed to Dr. Alessandra Bevels to relay information. Per Dr. Alessandra Bevels cancel procedure for today and reschedule for tomorrow, he will round on patient to discuss this with him. Call placed to patient nurse and informed her of this, advised for patient not drink any more prep at this time until MD see patient and discusses new plan.

## 2016-08-11 NOTE — Plan of Care (Signed)
Problem: Fluid Volume: Goal: Compliance with measures to maintain balanced fluid volume will improve Outcome: Progressing Patient verbalizes understanding about the importance of fluid volume management including monitoring intake amounts and following a renal diet.

## 2016-08-11 NOTE — Progress Notes (Signed)
PROGRESS NOTE    Jordan [name]  CWC:[id number] DOB: 04-11-1966 DOA: 08/04/2016 PCP: No PCP Per Patient   Brief Narrative:  Jordan [name] is a 50 y.o. male not seen by a PCP in several years, with a history of tobacco abuse, presenting to the ED with several day history of progressive shortness of breath with wheezing waking him out from his sleep. Dyspnea is worse with exertion, cannot walk more than one block before experiencing these symptoms. He denies any hemoptysis. He denies any gross blood in the stools. Patient's hemoglobin on admission was 5.1. He received 2 units of packed red blood cells. He has never had a colonoscopy. GI was consulted as well as Nephrology for AKI.    Assessment & Plan:   Principal Problem:   Symptomatic anemia Active Problems:   Tobacco abuse   ETOH abuse   Uncontrolled hypertension   Acute renal failure (ARF) (HCC)   Hypertensive emergency  Symptomatic anemia, combination of iron deficiency and blood loss anemia  -Received 2 units of packed red blood cells on 11/13  -Hemoccult positive  -Started on iron supplementation  -Hgb stabilized at 8  -GI following: EGD and colonoscopy scheduled for 11/21. Rescheduled as patient unable to finish prep yesterday    New ESRD  -Nephrology following: possibly renal failure due to ischemia in setting of symptomatic anemia with underlying hypertensive nephrosclerosis. Extensive workup including HIV, hep B/C, RPR negative. A1c only at 5.3. SPEP without any M spike. No hydronephrosis seen on renal ultrasound.  -Left upper extremity fistula placed 11/16. Need follow up 4-6 weeks with duplex to check maturation -Right IJ tunneled dialysis catheter placed 11/16  -HD per Nephrology  -CLIP pending   Uncontrolled hypertension, improved  -Norvasc and coreg   -No ACE/ARB  Alcohol abuse -Initially treated with CIWA, now off   Tobacco abuse -Nicotine patch -Cessation counseling   DVT prophylaxis: SCDs Code  Status: Full Family Communication: no family at bedside Disposition Plan: pending further work up and improvement, likely back home    Consultants:   GI  Nephrology  Procedures:   None  Antimicrobials:   None     Subjective: Patient doing well today. States he had a horrible night due to colonoscopy prep and was only able to finish 1/3 of it. No nausea, vomiting, not having clear BM.   Objective: Vitals:   08/10/16 1458 08/10/16 2226 08/11/16 0644 08/11/16 0843  BP: (!) 151/89 130/73 124/74 133/80  Pulse: 88 89 93 90  Resp: 20 17 16    Temp: 97.7 F (36.5 C) 98.3 F (36.8 C) 99.6 F (37.6 C)   TempSrc: Oral Oral Oral   SpO2:  99% 99%   Weight:      Height:        Intake/Output Summary (Last 24 hours) at 08/11/16 0905 Last data filed at 08/10/16 2226  Gross per 24 hour  Intake           131.01 ml  Output              711 ml  Net          -579.99 ml   Filed Weights   08/10/16 0043 08/10/16 0343 08/10/16 1145  Weight: 67.9 kg (149 lb 11.1 oz) 67.9 kg (149 lb 11.1 oz) 67.9 kg (149 lb 11.1 oz)    Examination:  General exam: Appears calm and comfortable  Respiratory system: Clear to auscultation. Respiratory effort normal. Cardiovascular system: S1 & S2 heard, RRR, systolic  murmur. No pedal edema. Gastrointestinal system: Abdomen is nondistended, soft and nontender. Normal bowel sounds heard. Central nervous system: Alert and oriented. No focal neurological deficits. Extremities: Symmetric 5 x 5 power. Left upper extremity with fistula in place, palpable thrill Skin: No rashes, lesions or ulcers, Right chest with tunneled catheter in place  Psychiatry: Judgement and insight appear normal. Mood & affect appropriate.   Data Reviewed: I have personally reviewed following labs and imaging studies  CBC:  Recent Labs Lab 08/07/16 0630 08/08/16 0721 08/09/16 0537 08/10/16 0632 08/11/16 0445  WBC 7.8 8.2 7.9 8.1 8.8  NEUTROABS 4.9 5.3 4.6 5.6 5.9  HGB 7.6*  8.9* 8.3* 8.5* 9.2*  HCT 23.2* 27.6* 26.3* 26.2* 28.2*  MCV 92.8 91.4 91.3 90.3 92.5  PLT 179 148* 131* 122* 440*   Basic Metabolic Panel:  Recent Labs Lab 08/05/16 1234  08/07/16 0630 08/08/16 0721 08/09/16 0537 08/10/16 0632 08/11/16 0445  NA  --   < > 139 138 140 135 136  K  --   < > 5.0 4.9 4.2 3.2* 3.6  CL  --   < > 112* 112* 109 100* 97*  CO2  --   < > 16* 14* 21* 27 28  GLUCOSE  --   < > 85 87 89 135* 86  BUN  --   < > 67* 64* 47* 22* 14  CREATININE  --   < > 11.85* 11.59* 9.47* 5.76* 5.02*  CALCIUM  --   < > 9.2 8.6* 9.0 8.8* 9.2  PHOS 4.9*  --   --   --   --   --   --   < > = values in this interval not displayed. GFR: Estimated Creatinine Clearance: 15.1 mL/min (by C-G formula based on SCr of 5.02 mg/dL (H)). Liver Function Tests:  Recent Labs Lab 08/05/16 0609  AST 15  ALT 16*  ALKPHOS 82  BILITOT 0.9  PROT 6.6  ALBUMIN 3.1*   No results for input(s): LIPASE, AMYLASE in the last 168 hours. No results for input(s): AMMONIA in the last 168 hours. Coagulation Profile: No results for input(s): INR, PROTIME in the last 168 hours. Cardiac Enzymes: No results for input(s): CKTOTAL, CKMB, CKMBINDEX, TROPONINI in the last 168 hours. BNP (last 3 results) No results for input(s): PROBNP in the last 8760 hours. HbA1C: No results for input(s): HGBA1C in the last 72 hours. CBG: No results for input(s): GLUCAP in the last 168 hours. Lipid Profile: No results for input(s): CHOL, HDL, LDLCALC, TRIG, CHOLHDL, LDLDIRECT in the last 72 hours. Thyroid Function Tests: No results for input(s): TSH, T4TOTAL, FREET4, T3FREE, THYROIDAB in the last 72 hours. Anemia Panel: No results for input(s): VITAMINB12, FOLATE, FERRITIN, TIBC, IRON, RETICCTPCT in the last 72 hours. Sepsis Labs: No results for input(s): PROCALCITON, LATICACIDVEN in the last 168 hours.  Recent Results (from the past 240 hour(s))  Urine culture     Status: None   Collection Time: 08/04/16 10:17 AM    Result Value Ref Range Status   Specimen Description URINE, CLEAN CATCH  Final   Special Requests NONE  Final   Culture NO GROWTH  Final   Report Status 08/05/2016 FINAL  Final  Culture, sputum-assessment     Status: None   Collection Time: 08/07/16  5:17 AM  Result Value Ref Range Status   Specimen Description EXPECTORATED SPUTUM  Final   Special Requests NONE  Final   Sputum evaluation   Final    MICROSCOPIC  FINDINGS SUGGEST THAT THIS SPECIMEN IS NOT REPRESENTATIVE OF LOWER RESPIRATORY SECRETIONS. PLEASE RECOLLECT. Gram Stain Report Called to,Read Back By and Verified With: A BAKER,RN @0722  08/07/16 MKELLY,MLT    Report Status 08/07/2016 FINAL  Final       Radiology Studies: No results found.    Scheduled Meds: . amLODipine  10 mg Oral Daily  . calcitRIOL  0.25 mcg Oral Daily  . carvedilol  6.25 mg Oral BID WC  . [START ON 08/12/2016] darbepoetin (ARANESP) injection - DIALYSIS  40 mcg Intravenous Q Tue-HD  . folic acid  1 mg Oral Daily  . multivitamin with minerals  1 tablet Oral Daily  . nicotine  14 mg Transdermal Daily  . polyethylene glycol  17 g Oral BID  . sodium chloride flush  3 mL Intravenous Q12H  . thiamine  100 mg Oral Daily   Or  . thiamine  100 mg Intravenous Daily   Continuous Infusions: . sodium chloride 10 mL/hr at 08/07/16 1320  . sodium chloride    . sodium chloride 10 mL/hr (08/10/16 1604)     LOS: 7 days    Time spent: 30 minutes   Dessa Phi, DO [facility] [email or web address] Password [email or web address] 08/11/2016, 9:05 AM

## 2016-08-12 ENCOUNTER — Encounter (HOSPITAL_COMMUNITY)
Admission: EM | Disposition: A | Discharge status: Home / Self Care | Payer: Self-pay | Source: Home / Self Care | Attending: Internal Medicine

## 2016-08-12 ENCOUNTER — Inpatient Hospital Stay (HOSPITAL_COMMUNITY): Payer: Medicaid Other | Admitting: Anesthesiology

## 2016-08-12 ENCOUNTER — Encounter (HOSPITAL_COMMUNITY): Payer: Self-pay

## 2016-08-12 HISTORY — PX: ESOPHAGOGASTRODUODENOSCOPY: SHX5428

## 2016-08-12 HISTORY — PX: COLONOSCOPY: SHX5424

## 2016-08-12 LAB — BASIC METABOLIC PANEL
Anion gap: 10 (ref 5–15)
BUN: 20 mg/dL (ref 6–20)
CO2: 26 mmol/L (ref 22–32)
Calcium: 9.3 mg/dL (ref 8.9–10.3)
Chloride: 99 mmol/L — ABNORMAL LOW (ref 101–111)
Creatinine, Ser: 6.96 mg/dL — ABNORMAL HIGH (ref 0.61–1.24)
GFR calc Af Amer: 10 mL/min — ABNORMAL LOW (ref >60)
GFR calc non Af Amer: 8 mL/min — ABNORMAL LOW (ref >60)
Glucose, Bld: 84 mg/dL (ref 65–99)
Potassium: 3.8 mmol/L (ref 3.5–5.1)
Sodium: 135 mmol/L (ref 135–145)

## 2016-08-12 LAB — CBC WITH DIFFERENTIAL/PLATELET
Basophils Absolute: 0 10*3/uL (ref 0.0–0.1)
Basophils Relative: 0 %
Eosinophils Absolute: 0.6 10*3/uL (ref 0.0–0.7)
Eosinophils Relative: 6 %
HCT: 28 % — ABNORMAL LOW (ref 39.0–52.0)
Hemoglobin: 8.8 g/dL — ABNORMAL LOW (ref 13.0–17.0)
Lymphocytes Relative: 17 %
Lymphs Abs: 1.7 10*3/uL (ref 0.7–4.0)
MCH: 29.1 pg (ref 26.0–34.0)
MCHC: 31.4 g/dL (ref 30.0–36.0)
MCV: 92.7 fL (ref 78.0–100.0)
Monocytes Absolute: 0.9 10*3/uL (ref 0.1–1.0)
Monocytes Relative: 9 %
Neutro Abs: 6.9 10*3/uL (ref 1.7–7.7)
Neutrophils Relative %: 68 %
Platelets: 118 10*3/uL — ABNORMAL LOW (ref 150–400)
RBC: 3.02 MIL/uL — ABNORMAL LOW (ref 4.22–5.81)
RDW: 17 % — ABNORMAL HIGH (ref 11.5–15.5)
WBC: 10.1 10*3/uL (ref 4.0–10.5)

## 2016-08-12 SURGERY — EGD (ESOPHAGOGASTRODUODENOSCOPY)
Anesthesia: Monitor Anesthesia Care

## 2016-08-12 MED ORDER — DARBEPOETIN ALFA 40 MCG/0.4ML IJ SOSY
PREFILLED_SYRINGE | INTRAMUSCULAR | Status: AC
  Administered 2016-08-12: 40 ug via INTRAVENOUS
  Filled 2016-08-12: qty 0.4

## 2016-08-12 MED ORDER — PROPOFOL 500 MG/50ML IV EMUL
INTRAVENOUS | Status: DC | PRN
  Administered 2016-08-12: 100 ug/kg/min via INTRAVENOUS

## 2016-08-12 MED ORDER — PROPOFOL 10 MG/ML IV BOLUS
INTRAVENOUS | Status: DC | PRN
  Administered 2016-08-12: 30 mg via INTRAVENOUS
  Administered 2016-08-12: 20 mg via INTRAVENOUS
  Administered 2016-08-12: 50 mg via INTRAVENOUS

## 2016-08-12 MED ORDER — SODIUM CHLORIDE 0.9 % IV SOLN
INTRAVENOUS | Status: DC | PRN
  Administered 2016-08-12: 10:00:00 via INTRAVENOUS

## 2016-08-12 MED ORDER — CARVEDILOL 6.25 MG PO TABS: 6.2500 mg | ORAL_TABLET | Freq: Two times a day (BID) | ORAL | 0 refills | Status: DC

## 2016-08-12 MED ORDER — LIDOCAINE HCL (CARDIAC) 20 MG/ML IV SOLN
INTRAVENOUS | Status: DC | PRN
  Administered 2016-08-12: 100 mg via INTRATRACHEAL

## 2016-08-12 MED ORDER — BUTAMBEN-TETRACAINE-BENZOCAINE 2-2-14 % EX AERO
INHALATION_SPRAY | CUTANEOUS | Status: DC | PRN
  Administered 2016-08-12: 2 via TOPICAL

## 2016-08-12 MED ORDER — AMLODIPINE BESYLATE 10 MG PO TABS: 10.0000 mg | ORAL_TABLET | Freq: Every day | ORAL | 0 refills | Status: DC

## 2016-08-12 NOTE — Anesthesia Postprocedure Evaluation (Signed)
Anesthesia Post Note  Patient: Jordan [name]  Procedure(s) Performed: Procedure(s) (LRB): ESOPHAGOGASTRODUODENOSCOPY (EGD) (N/A) COLONOSCOPY (N/A)  Patient location during evaluation: PACU Anesthesia Type: MAC Level of consciousness: awake and alert Pain management: pain level controlled Vital Signs Assessment: post-procedure vital signs reviewed and stable Respiratory status: spontaneous breathing, nonlabored ventilation and respiratory function stable Cardiovascular status: stable and blood pressure returned to baseline Anesthetic complications: no    Last Vitals:  Vitals:   08/12/16 1722 08/12/16 1806  BP: 119/76 117/70  Pulse: 87 92  Resp: 18   Temp: 37.1 C     Last Pain:  Vitals:   08/12/16 1722  TempSrc: Oral  PainSc: 0-No pain                 Kylee Nardozzi,W. EDMOND

## 2016-08-12 NOTE — Anesthesia Procedure Notes (Signed)
Procedure Name: MAC Date/Time: 08/12/2016 9:52 AM Performed by: Lance Coon Pre-anesthesia Checklist: Patient identified, Emergency Drugs available, Suction available, Patient being monitored and Timeout performed Patient Re-evaluated:Patient Re-evaluated prior to inductionOxygen Delivery Method: Nasal cannula

## 2016-08-12 NOTE — Op Note (Signed)
[facility] Patient Name: Jordan [name] Procedure Date : 08/12/2016 MRN: [id number] Attending MD: Otis Brace , MD Date of Birth: November 22, 1965 CSN: [id number] Age: 50 Admit Type: Inpatient Procedure:                Upper GI endoscopy Indications:              Gastrointestinal bleeding of unknown origin Providers:                Otis Brace, MD, Dortha Schwalbe RN, RN,                            Despina Pole Tech, Technician, Lance Coon, CRNA Referring MD:              Medicines:                Sedation Administered by an Anesthesia Professional Complications:            No immediate complications. Estimated Blood Loss:     Estimated blood loss: none. Procedure:                Pre-Anesthesia Assessment:                           - Prior to the procedure, a History and Physical                            was performed, and patient medications and                            allergies were reviewed. The patient's tolerance of                            previous anesthesia was also reviewed. The risks                            and benefits of the procedure and the sedation                            options and risks were discussed with the patient.                            All questions were answered, and informed consent                            was obtained. Prior Anticoagulants: The patient has                            taken no previous anticoagulant or antiplatelet                            agents. ASA Grade Assessment: III - A patient with                            severe systemic disease. After reviewing the risks  and benefits, the patient was deemed in                            satisfactory condition to undergo the procedure.                           After obtaining informed consent, the endoscope was                            passed under direct vision. Throughout the                            procedure, the  patient's blood pressure, pulse, and                            oxygen saturations were monitored continuously. The                            Endoscope was introduced through the mouth, and                            advanced to the second part of duodenum. The upper                            GI endoscopy was accomplished with ease. The                            patient tolerated the procedure well. Scope In: Scope Out: Findings:      The Z-line was regular and was found 40 cm from the incisors.      Localized mild inflammation characterized by erythema, friability,       granularity and linear erosions was found in the gastric fundus.       Biopsies were taken with a cold forceps for Helicobacter pylori testing.      The exam of the stomach was otherwise normal.      The duodenal bulb, first portion of the duodenum and second portion of       the duodenum were normal. Impression:               - Z-line regular, 40 cm from the incisors.                           - Gastritis. Biopsied.                           - Normal duodenal bulb, first portion of the                            duodenum and second portion of the duodenum. Moderate Sedation:      Moderate (conscious) sedation was personally administered by an       anesthesia professional. The following parameters were monitored: oxygen       saturation, heart rate, blood pressure, and response to care. Recommendation:           - Return patient to hospital ward for ongoing  care.                           - Resume previous diet.                           - Continue present medications.                           - Await pathology results. Procedure Code(s):        --- Professional ---                           [phone], Esophagogastroduodenoscopy, flexible,                            transoral; with biopsy, single or multiple Diagnosis Code(s):        --- Professional ---                           K29.70, Gastritis, unspecified,  without bleeding                           K92.2, Gastrointestinal hemorrhage, unspecified CPT copyright 2016 [employer] Association. All rights reserved. The codes documented in this report are preliminary and upon coder review may  be revised to meet current compliance requirements. Otis Brace, MD Otis Brace, MD 08/12/2016 10:32:12 AM Number of Addenda: 0

## 2016-08-12 NOTE — Progress Notes (Signed)
Jordan [name] is a 50 y.o. male has presented with anemia.  The various methods of treatment have been discussed with the patient and family. After consideration of risks, benefits and other options for treatment, the patient has consented to  Procedure(s): EGD and colonoscopy.  as a surgical intervention .  The patient's history has been reviewed, patient examined, no change in status, stable for surgery.  I have reviewed the patient's chart and labs.  Questions were answered to the patient's satisfaction.

## 2016-08-12 NOTE — Progress Notes (Addendum)
PROGRESS NOTE    Jordan [name]  XAJ:[id number] DOB: September 21, 1966 DOA: 08/04/2016 PCP: No PCP Per Patient   Brief Narrative:  Jordan [name] is a 50 y.o. male not seen by a PCP in several years, with a history of tobacco abuse, presenting to the ED with several day history of progressive shortness of breath with wheezing waking him out from his sleep. Dyspnea is worse with exertion, cannot walk more than one block before experiencing these symptoms. He denies any hemoptysis. He denies any gross blood in the stools. Patient's hemoglobin on admission was 5.1. He received 2 units of packed red blood cells. He has never had a colonoscopy. GI was consulted as well as Nephrology for AKI.    Assessment & Plan:   Principal Problem:   Symptomatic anemia Active Problems:   Tobacco abuse   ETOH abuse   Uncontrolled hypertension   Acute renal failure (ARF) (HCC)   Hypertensive emergency  Symptomatic anemia, combination of iron deficiency and blood loss anemia  -Received 2 units of packed red blood cells on 11/13  -Hemoccult positive  -Started on iron supplementation  -Hgb stabilized at 8  -GI consulted, S/p EGD and colonoscopy on 11/21 -- found gastritis, colon polyp  -Gastric biopsy for H Pylori pending -Colon polyp biopsy pending  -Will need to follow with GI (dr. Alessandra Bevels) in 4 weeks, and repeat colonoscopy in 1 year   New ESRD  -Nephrology following: possibly renal failure due to ischemia in setting of symptomatic anemia with underlying hypertensive nephrosclerosis. Extensive workup including HIV, hep B/C, RPR negative. A1c only at 5.3. SPEP without any M spike. No hydronephrosis seen on renal ultrasound.  -Left upper extremity fistula placed 11/16. Need follow up 4-6 weeks with duplex to check maturation -Right IJ tunneled dialysis catheter placed 11/16  -HD per Nephrology  -CLIP pending   Uncontrolled hypertension, improved  -Norvasc and coreg   -No ACE/ARB  Alcohol  abuse -Initially treated with CIWA, now off   Tobacco abuse -Nicotine patch -Cessation counseling   DVT prophylaxis: SCDs Code Status: Full Family Communication: no family at bedside Disposition Plan: EGD/Colonoscopy/HD today, CLIP still pending. Likely back home tomorrow 11/22    Consultants:   GI  Nephrology  Vascular surgery  Procedures:   LUE fistula placed 11/16, RIJ tunneled catheter placed 11/16  EGD/Colonoscopy 11/21   Antimicrobials:   None     Subjective: Patient doing well today. No episodes of nausea, vomiting, abdominal pain. No further bleeding episodes. Had some numbness in left arm in area of fistula, but now resolved   Objective: Vitals:   08/12/16 1035 08/12/16 1040 08/12/16 1108 08/12/16 1208  BP: 108/70 132/76 128/81 127/70  Pulse: 90 90 88   Resp: (!) 21 16 16    Temp:   98.2 F (36.8 C)   TempSrc:   Oral   SpO2: 97% 97% 98%   Weight:      Height:        Intake/Output Summary (Last 24 hours) at 08/12/16 1219 Last data filed at 08/12/16 1032  Gross per 24 hour  Intake           469.17 ml  Output                0 ml  Net           469.17 ml   Filed Weights   08/10/16 0343 08/10/16 1145 08/12/16 0651  Weight: 67.9 kg (149 lb 11.1 oz) 67.9 kg (149 lb  11.1 oz) 69.4 kg (153 lb 1.6 oz)    Examination:  General exam: Appears calm and comfortable  Respiratory system: Clear to auscultation. Respiratory effort normal. Cardiovascular system: S1 & S2 heard, RRR, systolic murmur. No pedal edema. Gastrointestinal system: Abdomen is nondistended, soft and nontender. Normal bowel sounds heard. Central nervous system: Alert and oriented. No focal neurological deficits. Extremities: Symmetric 5 x 5 power. Left upper extremity with fistula in place, palpable thrill, arm is warm to palpation  Skin: No rashes, lesions or ulcers, Right chest with tunneled catheter in place  Psychiatry: Judgement and insight appear normal. Mood & affect appropriate.    Data Reviewed: I have personally reviewed following labs and imaging studies  CBC:  Recent Labs Lab 08/08/16 0721 08/09/16 0537 08/10/16 0632 08/11/16 0445 08/12/16 0553  WBC 8.2 7.9 8.1 8.8 10.1  NEUTROABS 5.3 4.6 5.6 5.9 6.9  HGB 8.9* 8.3* 8.5* 9.2* 8.8*  HCT 27.6* 26.3* 26.2* 28.2* 28.0*  MCV 91.4 91.3 90.3 92.5 92.7  PLT 148* 131* 122* 111* 657*   Basic Metabolic Panel:  Recent Labs Lab 08/05/16 1234  08/08/16 0721 08/09/16 0537 08/10/16 0632 08/11/16 0445 08/12/16 0553  NA  --   < > 138 140 135 136 135  K  --   < > 4.9 4.2 3.2* 3.6 3.8  CL  --   < > 112* 109 100* 97* 99*  CO2  --   < > 14* 21* 27 28 26   GLUCOSE  --   < > 87 89 135* 86 84  BUN  --   < > 64* 47* 22* 14 20  CREATININE  --   < > 11.59* 9.47* 5.76* 5.02* 6.96*  CALCIUM  --   < > 8.6* 9.0 8.8* 9.2 9.3  PHOS 4.9*  --   --   --   --   --   --   < > = values in this interval not displayed. GFR: Estimated Creatinine Clearance: 10.9 mL/min (by C-G formula based on SCr of 6.96 mg/dL (H)). Liver Function Tests: No results for input(s): AST, ALT, ALKPHOS, BILITOT, PROT, ALBUMIN in the last 168 hours. No results for input(s): LIPASE, AMYLASE in the last 168 hours. No results for input(s): AMMONIA in the last 168 hours. Coagulation Profile: No results for input(s): INR, PROTIME in the last 168 hours. Cardiac Enzymes: No results for input(s): CKTOTAL, CKMB, CKMBINDEX, TROPONINI in the last 168 hours. BNP (last 3 results) No results for input(s): PROBNP in the last 8760 hours. HbA1C: No results for input(s): HGBA1C in the last 72 hours. CBG: No results for input(s): GLUCAP in the last 168 hours. Lipid Profile: No results for input(s): CHOL, HDL, LDLCALC, TRIG, CHOLHDL, LDLDIRECT in the last 72 hours. Thyroid Function Tests: No results for input(s): TSH, T4TOTAL, FREET4, T3FREE, THYROIDAB in the last 72 hours. Anemia Panel: No results for input(s): VITAMINB12, FOLATE, FERRITIN, TIBC, IRON, RETICCTPCT  in the last 72 hours. Sepsis Labs: No results for input(s): PROCALCITON, LATICACIDVEN in the last 168 hours.  Recent Results (from the past 240 hour(s))  Urine culture     Status: None   Collection Time: 08/04/16 10:17 AM  Result Value Ref Range Status   Specimen Description URINE, CLEAN CATCH  Final   Special Requests NONE  Final   Culture NO GROWTH  Final   Report Status 08/05/2016 FINAL  Final  Culture, sputum-assessment     Status: None   Collection Time: 08/07/16  5:17 AM  Result  Value Ref Range Status   Specimen Description EXPECTORATED SPUTUM  Final   Special Requests NONE  Final   Sputum evaluation   Final    MICROSCOPIC FINDINGS SUGGEST THAT THIS SPECIMEN IS NOT REPRESENTATIVE OF LOWER RESPIRATORY SECRETIONS. PLEASE RECOLLECT. Gram Stain Report Called to,Read Back By and Verified With: A BAKER,RN @0722  08/07/16 MKELLY,MLT    Report Status 08/07/2016 FINAL  Final       Radiology Studies: No results found.    Scheduled Meds: . amLODipine  10 mg Oral Daily  . calcitRIOL  0.25 mcg Oral Daily  . carvedilol  6.25 mg Oral BID WC  . darbepoetin (ARANESP) injection - DIALYSIS  40 mcg Intravenous Q Tue-HD  . folic acid  1 mg Oral Daily  . multivitamin with minerals  1 tablet Oral Daily  . nicotine  14 mg Transdermal Daily  . polyethylene glycol  17 g Oral BID  . sodium chloride flush  3 mL Intravenous Q12H  . thiamine  100 mg Oral Daily   Continuous Infusions: . sodium chloride 10 mL/hr at 08/07/16 1320  . sodium chloride    . sodium chloride 10 mL/hr (08/10/16 1604)     LOS: 8 days    Time spent: 30 minutes   Dessa Phi, DO [facility] [email or web address] Password [email or web address] 08/12/2016, 12:19 PM

## 2016-08-12 NOTE — Progress Notes (Signed)
08/12/2016 1:53 PM Hemodialysis Outpatient Note; this patient has been accepted at the [facility] on [address], on a Tuesday, Thursday and Saturday 2nd shift schedule. Due to the Holiday this week the center is requesting that Mr. Ager present to the center tomorrow (Wednesday) prior to 2pm to sign his paperwork and consents. He can then be dialyzed on Friday and Saturday this week and begin his regular schedule next week. Thank you. Jenetta Downer.

## 2016-08-12 NOTE — Progress Notes (Signed)
Assessment/ Plan:   1. New ESRD: nephrosclerosis and family history of ESRD in brother Jordan [name].  -CLIP, HD today; await OP schedule for HD 2 GIB with polyp on colonoscopy with poor prep, gastritis on endo 3 Anemia   Subjective: Colonoscopy done today  Objective: Vital signs in last 24 hours: Temp:  [98.2 F (36.8 C)-99.6 F (37.6 C)] 98.2 F (36.8 C) (11/21 1108) Pulse Rate:  [84-92] 88 (11/21 1108) Resp:  [16-21] 16 (11/21 1108) BP: (86-135)/(52-85) 127/70 (11/21 1208) SpO2:  [95 %-99 %] 98 % (11/21 1108) Weight:  [69.4 kg (153 lb 1.6 oz)] 69.4 kg (153 lb 1.6 oz) (11/21 0651) Weight change: 1.546 kg (3 lb 6.5 oz)  Intake/Output from previous day: 11/20 0701 - 11/21 0700 In: 469.2 [P.O.:100; I.V.:369.2] Out: 100 [Urine:100] Intake/Output this shift: No intake/output data recorded.  General appearance: alert and cooperative Resp: clear to auscultation bilaterally Chest wall: no tenderness Cardio: regular rate and rhythm, S1, S2 normal, no murmur, click, rub or gallop Extremities: extremities normal, atraumatic, no cyanosis or edema AVF immature on LUE; TDC on R  Lab Results:  Recent Labs  08/11/16 0445 08/12/16 0553  WBC 8.8 10.1  HGB 9.2* 8.8*  HCT 28.2* 28.0*  PLT 111* 118*   BMET:  Recent Labs  08/11/16 0445 08/12/16 0553  NA 136 135  K 3.6 3.8  CL 97* 99*  CO2 28 26  GLUCOSE 86 84  BUN 14 20  CREATININE 5.02* 6.96*  CALCIUM 9.2 9.3   No results for input(s): PTH in the last 72 hours. Iron Studies: No results for input(s): IRON, TIBC, TRANSFERRIN, FERRITIN in the last 72 hours. Studies/Results: No results found.  Scheduled: . amLODipine  10 mg Oral Daily  . calcitRIOL  0.25 mcg Oral Daily  . carvedilol  6.25 mg Oral BID WC  . darbepoetin (ARANESP) injection - DIALYSIS  40 mcg Intravenous Q Tue-HD  . folic acid  1 mg Oral Daily  . multivitamin with minerals  1 tablet Oral Daily  . nicotine  14 mg Transdermal Daily  . polyethylene glycol  17 g  Oral BID  . sodium chloride flush  3 mL Intravenous Q12H  . thiamine  100 mg Oral Daily     LOS: 8 days   Jordan [name] C 08/12/2016,12:46 PM

## 2016-08-12 NOTE — Progress Notes (Signed)
[facility] Gastroenterology Progress Note  Jordan [name] 50 y.o. [birth year]  CC:   Anemia,    Subjective:  Patient completed his prep. Noted small amount of bright blood in the stool. For EGD and colonoscopy today.  hemoglobin remained stable.   ROS : negative for abdominal pain, nausea, vomiting.    Objective: Vital signs in last 24 hours: Vitals:   08/11/16 2314 08/12/16 0651  BP: 124/71 116/71  Pulse: 92 87  Resp: 17 16  Temp: 99.6 F (37.6 C) 98.7 F (37.1 C)    Physical Exam:  General:  Alert, cooperative, no distress, appears stated age  Head:  Normocephalic, without obvious abnormality, atraumatic  Eyes:  , EOM's intact,   Lungs:   Clear to auscultation bilaterally, respirations unlabored  Heart:  Regular rate and rhythm, S1, S2 normal  Abdomen:   Soft, non-tender, bowel sounds active all four quadrants,  no masses,   Extremities: Extremities normal, atraumatic, no  edema  Pulses: 2+ and symmetric    Lab Results:  Recent Labs  08/11/16 0445 08/12/16 0553  NA 136 135  K 3.6 3.8  CL 97* 99*  CO2 28 26  GLUCOSE 86 84  BUN 14 20  CREATININE 5.02* 6.96*  CALCIUM 9.2 9.3   No results for input(s): AST, ALT, ALKPHOS, BILITOT, PROT, ALBUMIN in the last 72 hours.  Recent Labs  08/11/16 0445 08/12/16 0553  WBC 8.8 10.1  NEUTROABS 5.9 6.9  HGB 9.2* 8.8*  HCT 28.2* 28.0*  MCV 92.5 92.7  PLT 111* 118*   No results for input(s): LABPROT, INR in the last 72 hours.    Assessment/Plan: - Anemia - Occult blood positive - Acute renal failure  Recommendations ------------------------- - EGD and colonoscopy today. Risk benefits discussed with the patient. Verbalized understanding - Further plan based on endoscopic finding - GI Will follow   Otis Brace MD, FACP 08/12/2016, 8:29 AM  Pager [phone]  If no answer or after 5 PM call [phone]

## 2016-08-12 NOTE — Op Note (Signed)
[facility] Patient Name: Jordan [name] Procedure Date : 08/12/2016 MRN: [id number] Attending MD: Otis Brace , MD Date of Birth: Feb 04, 1966 CSN: [id number] Age: 50 Admit Type: Inpatient Procedure:                Colonoscopy Indications:              Heme positive stool, Iron deficiency anemia Providers:                Otis Brace, MD, Dortha Schwalbe RN, RN,                            Despina Pole Tech, Technician, Lance Coon, CRNA Referring MD:              Medicines:                Sedation Administered by an Anesthesia Professional Complications:            No immediate complications. Estimated Blood Loss:     Estimated blood loss: none. Procedure:                Pre-Anesthesia Assessment:                           - Prior to the procedure, a History and Physical                            was performed, and patient medications and                            allergies were reviewed. The patient's tolerance of                            previous anesthesia was also reviewed. The risks                            and benefits of the procedure and the sedation                            options and risks were discussed with the patient.                            All questions were answered, and informed consent                            was obtained. Prior Anticoagulants: The patient has                            taken no previous anticoagulant or antiplatelet                            agents. ASA Grade Assessment: III - A patient with                            severe systemic disease. After reviewing the risks  and benefits, the patient was deemed in                            satisfactory condition to undergo the procedure.                           After obtaining informed consent, the colonoscope                            was passed under direct vision. Throughout the                            procedure, the patient's  blood pressure, pulse, and                            oxygen saturations were monitored continuously. The                            EC-[id number] ([id number]) scope was introduced through                            the anus and advanced to the the cecum, identified                            by appendiceal orifice and ileocecal valve. The                            colonoscopy was performed with ease. The patient                            tolerated the procedure well. The quality of the                            bowel preparation was poor. The ileocecal valve,                            appendiceal orifice, and rectum were photographed.                            The quality of the bowel preparation was poor. Scope In: 10:06:52 AM Scope Out: 10:31:05 AM Scope Withdrawal Time: 0 hours 5 minutes 57 seconds  Total Procedure Duration: 0 hours 24 minutes 13 seconds  Findings:      The perianal exam findings include non-thrombosed external hemorrhoids.      A small amount of semi-liquid stool was found in the sigmoid colon, in       the transverse colon and in the cecum, interfering with visualization.      A localized area of mildly congested and erythematous mucosa was found       at the hepatic flexure. Biopsies were taken with a cold forceps for       histology.      A 8 mm polyp was found in the distal sigmoid colon. The polyp was       semi-pedunculated. The polyp was removed with a cold snare. Resection  and retrieval were complete.      Internal hemorrhoids were found during retroflexion. The hemorrhoids       were medium-sized. Impression:               - Preparation of the colon was poor.                           - Non-thrombosed external hemorrhoids found on                            perianal exam.                           - Stool in the sigmoid colon, in the transverse                            colon and in the cecum.                           - Congested and erythematous  mucosa at the hepatic                            flexure. Biopsied.                           - One 8 mm polyp in the distal sigmoid colon,                            removed with a cold snare. Resected and retrieved.                           - Internal hemorrhoids. Moderate Sedation:      Moderate (conscious) sedation was personally administered by an       anesthesia professional. The following parameters were monitored: oxygen       saturation, heart rate, blood pressure, and response to care. Recommendation:           - Return patient to hospital ward for ongoing care.                           - Resume previous diet.                           - Continue present medications.                           - Await pathology results.                           - Repeat colonoscopy in 1 year because the bowel                            preparation was suboptimal.                           - Return to my office in 4 weeks. Procedure Code(s):        ---  Professional ---                           [phone], Colonoscopy, flexible; with removal of                            tumor(s), polyp(s), or other lesion(s) by snare                            technique                           45380, 59, Colonoscopy, flexible; with biopsy,                            single or multiple Diagnosis Code(s):        --- Professional ---                           K64.4, Residual hemorrhoidal skin tags                           K64.8, Other hemorrhoids                           K63.89, Other specified diseases of intestine                           D12.5, Benign neoplasm of sigmoid colon                           R19.5, Other fecal abnormalities                           D50.9, Iron deficiency anemia, unspecified CPT copyright 2016 American Medical Association. All rights reserved. The codes documented in this report are preliminary and upon coder review may  be revised to meet current compliance requirements. Otis Brace, MD Otis Brace, MD 08/12/2016 10:40:00 AM Number of Addenda: 0

## 2016-08-12 NOTE — Brief Op Note (Signed)
08/04/2016 - 08/12/2016  10:50 AM  PATIENT:  Jordan [name]  51 y.o. male  PRE-OPERATIVE DIAGNOSIS:  GI Bleed  POST-OPERATIVE DIAGNOSIS:  gastritis; colon polyp. No active bleeding. Poor prep    PROCEDURE:  Procedure(s): ESOPHAGOGASTRODUODENOSCOPY (EGD) (N/A) COLONOSCOPY (N/A)  SURGEON:  Surgeon(s) and Role:    * Otis Brace, MD - Primary  Recommendations -------------------------- - Advance diet as tolerated - While up in GI clinic in 4 weeks - Repeat colonoscopy in 1 year secondary to poor prep and colon polyp. - If ongoing bleeding, may need capsule endoscopy - GI will sign off. Call us back if needed

## 2016-08-12 NOTE — Progress Notes (Signed)
[name] to be D/C'd to home per MD order.  Discussed with the patient and all questions fully answered.  VSS, Skin clean, dry and intact without evidence of skin break down, no evidence of skin tears noted. IV catheter discontinued intact. Site without signs and symptoms of complications. Dressing and pressure applied.  An After Visit Summary was printed and given to the patient. Patient received prescriptions.  D/c education completed with patient/family including follow up instructions, medication list, d/c activities limitations if indicated, with other d/c instructions as indicated by MD - patient able to verbalize understanding, all questions fully answered.   Patient instructed to return to ED, call 911, or call MD for any changes in condition.   Patient escorted via [place], and D/C home via private auto.  Lynann Beaver 08/12/2016 6:36 PM

## 2016-08-12 NOTE — Transfer of Care (Signed)
Immediate Anesthesia Transfer of Care Note  Patient: Jordan [name]  Procedure(s) Performed: Procedure(s): ESOPHAGOGASTRODUODENOSCOPY (EGD) (N/A) COLONOSCOPY (N/A)  Patient Location: Endoscopy Unit  Anesthesia Type:MAC  Level of Consciousness: sedated and patient cooperative  Airway & Oxygen Therapy: Patient Spontanous Breathing  Post-op Assessment: Report given to RN and Post -op Vital signs reviewed and stable  Post vital signs: Reviewed and stable  Last Vitals:  Vitals:   08/12/16 0651 08/12/16 0843  BP: 116/71 135/85  Pulse: 87 85  Resp: 16 19  Temp: 37.1 C 36.9 C    Last Pain:  Vitals:   08/12/16 0843  TempSrc: Oral  PainSc:       Patients Stated Pain Goal: 4 (11/46/43 1427)  Complications: No apparent anesthesia complications

## 2016-08-12 NOTE — Discharge Summary (Signed)
Physician Discharge Summary  Jordan VOLKOV [name]:[id number] DOB: 07-23-1966 DOA: 08/04/2016  PCP: No PCP Per Patient  Admit date: 08/04/2016 Discharge date: 08/12/2016  Admitted From: Home Disposition:  Home  Recommendations for Outpatient Follow-up:  1. Follow up with PCP in 1-2 weeks 2. You have been accepted at [facility] on [address], on a Tuesday, Thursday and Saturday 2nd shift schedule. You must go to the dialysis center on 11/22 prior to 2pm to sign paperwork and consent, then you will be dialyzed on Friday and Saturday this week and begin regular schedule next week.  3. Follow up with GI (Dr. Alessandra Bevels) in 4 weeks and repeat colonoscopy in 1 year.  4. Follow up with Vascular Surgery (Dr. Scot Dock) in 4-6 weeks with duplex to check maturation of left upper arm fistula  5. Please obtain BMP/CBC in one week  6. Please follow up on the following pending results: gastric biopsy for H Pylori and colon polyp biopsy   Home Health: No  Equipment/Devices: None   Discharge Condition: Stable CODE STATUS: Full  Diet recommendation: Renal diet   Brief/Interim Summary: Jordan Teall Hawkinsis a 50 y.o.malenot seen by a PCP in several years, with a history of tobacco abuse, presenting to the ED with several day history of progressive shortness of breath with wheezingwaking him out from his sleep. Dyspnea is worse with exertion, cannot walk more than one block before experiencing these symptoms. He denies any hemoptysis. He denies any gross blood in the stools. Patient's hemoglobin on admission was 5.1. He received 2 units of packed red blood cells. He has never had a colonoscopy. GI was consulted as well as Nephrology for AKI. See below for further details of hospitalization, work up, and plan.   Discharge Diagnoses:  Principal Problem:   Symptomatic anemia Active Problems:   Tobacco abuse   ETOH abuse   Uncontrolled hypertension   Acute renal failure (ARF)  (HCC)   Hypertensive emergency  Symptomatic anemia, combination of iron deficiency and blood loss anemia  -Received 2 units of packed red blood cells on 11/13  -Hemoccult positive  -Hgb stabilized at 8. No further bleeding episodes.  -GI consulted, S/p EGD and colonoscopy on 11/21 -- found gastritis, colon polyp  -Gastric biopsy for H Pylori pending -Colon polyp biopsy pending  -Will need to follow with GI (Dr. Alessandra Bevels) in 4 weeks, and repeat colonoscopy in 1 year   New ESRD  -Nephrology following: possibly renal failure due to ischemia in setting of symptomatic anemia with underlying hypertensive nephrosclerosis. Extensive workup including HIV, hep B/C, RPR negative. A1c only at 5.3. SPEP without any M spike. No hydronephrosis seen on renal ultrasound.  -Left upper extremity fistula placed 11/16. Need follow up 4-6 weeks with duplex to check maturation -Right IJ tunneled dialysis catheter placed 11/16  -HD per Nephrology  -CLIP completed, [facility]   Uncontrolled hypertension, improved  -Norvasc and coreg  -No ACE/ARB  Alcohol abuse -Initially treated with CIWA, now off   Tobacco abuse -Nicotine patch -Cessation counseling   Discharge Instructions  Discharge Instructions    Call MD for:  extreme fatigue    Complete by:  As directed    Call MD for:  persistant dizziness or light-headedness    Complete by:  As directed    Call MD for:  redness, tenderness, or signs of infection (pain, swelling, redness, odor or green/yellow discharge around incision site)    Complete by:  As directed    Call  MD for:  temperature >100.4    Complete by:  As directed    Diet - low sodium heart healthy    Complete by:  As directed    Discharge instructions    Complete by:  As directed    Recommendations for Outpatient Follow-up:  Follow up with PCP in 1-2 weeks You have been accepted at [facility] on [address] [place], on a Tuesday, Thursday and  Saturday 2nd shift schedule. You must go to the dialysis center on 11/22 prior to 2pm to sign paperwork and consent, then you will be dialyzed on Friday and Saturday this week and begin regular schedule next week.  Follow up with GI (Dr. Alessandra Bevels) in 4 weeks and repeat colonoscopy in 1 year.  Follow up with Vascular Surgery in 4-6 weeks with duplex to check maturation of left upper arm fistula  Please obtain BMP/CBC in one week  Please follow up on the following pending results: gastric biopsy for H Pylori and colon polyp biopsy   Increase activity slowly    Complete by:  As directed        Medication List    TAKE these medications   amLODipine 10 MG tablet Commonly known as:  NORVASC Take 1 tablet (10 mg total) by mouth daily. Start taking on:  08/13/2016   carvedilol 6.25 MG tablet Commonly known as:  COREG Take 1 tablet (6.25 mg total) by mouth 2 (two) times daily with a meal.      Follow-up Information    [name], MD Follow up in 4 week(s).   Specialties:  Vascular Surgery, Cardiology Why:  Our office will call you to ararnge an appointment  Contact information: [place]  [address] [address]. Go on 09/08/2016.   Specialty:  Internal Medicine Why:  at 2:30pm. Please arrive 15 minutes early and bring your photo ID. This is to establish primary care. Contact information: [facility].   Why:  You may fill your prescriptions here at discharge for a reduced price, usually around $4 each. Contact information: [place] [address] [phone]         No Known Allergies  Consultations:  GI  Nephro  Vascular Sx    Procedures/Studies: Dg Chest 2 View  Result Date: 08/04/2016 CLINICAL DATA:  50 y/o M; shortness of breath and mid chest pain  for 1 week. EXAM: CHEST  2 VIEW COMPARISON:  None. FINDINGS: Moderate bronchitic changes. No focal consolidation. No pneumothorax or effusion. Normal cardiac silhouette. Mild degenerative changes of thoracic spine. IMPRESSION: Moderate bronchitic changes.  No focal consolidation. Electronically Signed   By: Kristine Garbe M.D.   On: 08/04/2016 04:10   US Renal  Result Date: 08/04/2016 CLINICAL DATA:  Chronic renal insufficiency EXAM: RENAL / URINARY TRACT ULTRASOUND COMPLETE COMPARISON:  None in PACs FINDINGS: Right Kidney: Length: 7.2 cm. The renal cortical echotexture is markedly increased and greater than that of the adjacent liver. There is no hydronephrosis. No discrete mass is observed. Left Kidney: Length: 9.1 cm. The renal cortical echotexture is markedly increased similar to that on the right. There is no hydronephrosis nor parenchymal mass. Bladder: The partially distended urinary bladder is normal. The prostate gland produces a mild irregular impression upon the urinary bladder base. IMPRESSION:  Renal cortical atrophy and increased renal cortical echotexture consistent with medical renal disease. No hydronephrosis. Normal appearance of the urinary bladder. Electronically Signed   By: David  Martinique M.D.   On: 08/04/2016 13:25   Dg Chest Port 1 View  Result Date: 08/07/2016 CLINICAL DATA:  Status post dialysis catheter insertion. EXAM: PORTABLE CHEST 1 VIEW COMPARISON:  08/04/2016. FINDINGS: Interval right jugular catheter with its tip at the superior cavoatrial junction. No pneumothorax. Borderline enlarged cardiac silhouette with prominent pulmonary vasculature. Clear lungs. Unremarkable bones. IMPRESSION: 1. Right jugular catheter tip at the superior cavoatrial junction without pneumothorax. 2. Pulmonary vascular congestion and borderline cardiomegaly. Electronically Signed   By: Claudie Revering M.D.   On: 08/07/2016 21:22     Discharge Exam: Vitals:   08/12/16 1352 08/12/16 1430   BP: 115/68 114/64  Pulse: 89 88  Resp:    Temp:     Vitals:   08/12/16 1208 08/12/16 1347 08/12/16 1352 08/12/16 1430  BP: 127/70 127/67 115/68 114/64  Pulse:  88 89 88  Resp:  18    Temp:  98.1 F (36.7 C)    TempSrc:  Oral    SpO2:  98%    Weight:      Height:        General: Pt is alert, awake, not in acute distress Cardiovascular: RRR, S1/S2 +, no rubs, no gallops Respiratory: CTA bilaterally, no wheezing, no rhonchi Abdominal: Soft, NT, ND, bowel sounds + Extremities: no edema, no cyanosis, left upper fistula with palpable thrill    The results of significant diagnostics from this hospitalization (including imaging, microbiology, ancillary and laboratory) are listed below for reference.     Microbiology: Recent Results (from the past 240 hour(s))  Urine culture     Status: None   Collection Time: 08/04/16 10:17 AM  Result Value Ref Range Status   Specimen Description URINE, CLEAN CATCH  Final   Special Requests NONE  Final   Culture NO GROWTH  Final   Report Status 08/05/2016 FINAL  Final  Culture, sputum-assessment     Status: None   Collection Time: 08/07/16  5:17 AM  Result Value Ref Range Status   Specimen Description EXPECTORATED SPUTUM  Final   Special Requests NONE  Final   Sputum evaluation   Final    MICROSCOPIC FINDINGS SUGGEST THAT THIS SPECIMEN IS NOT REPRESENTATIVE OF LOWER RESPIRATORY SECRETIONS. PLEASE RECOLLECT. Gram Stain Report Called to,Read Back By and Verified With: A BAKER,RN @0722  08/07/16 MKELLY,MLT    Report Status 08/07/2016 FINAL  Final     Labs: BNP (last 3 results) No results for input(s): BNP in the last 8760 hours. Basic Metabolic Panel:  Recent Labs Lab 08/08/16 0721 08/09/16 0537 08/10/16 0632 08/11/16 0445 08/12/16 0553  NA 138 140 135 136 135  K 4.9 4.2 3.2* 3.6 3.8  CL 112* 109 100* 97* 99*  CO2 14* 21* 27 28 26   GLUCOSE 87 89 135* 86 84  BUN 64* 47* 22* 14 20  CREATININE 11.59* 9.47* 5.76* 5.02* 6.96*   CALCIUM 8.6* 9.0 8.8* 9.2 9.3   Liver Function Tests: No results for input(s): AST, ALT, ALKPHOS, BILITOT, PROT, ALBUMIN in the last 168 hours. No results for input(s): LIPASE, AMYLASE in the last 168 hours. No results for input(s): AMMONIA in the last 168 hours. CBC:  Recent Labs Lab 08/08/16 0721 08/09/16 0537 08/10/16 0632 08/11/16 0445 08/12/16 0553  WBC 8.2 7.9 8.1 8.8 10.1  NEUTROABS 5.3 4.6 5.6 5.9 6.9  HGB 8.9* 8.3* 8.5* 9.2* 8.8*  HCT 27.6* 26.3* 26.2* 28.2* 28.0*  MCV 91.4 91.3 90.3 92.5 92.7  PLT 148* 131* 122* 111* 118*   Cardiac Enzymes: No results for input(s): CKTOTAL, CKMB, CKMBINDEX, TROPONINI in the last 168 hours. BNP: Invalid input(s): POCBNP CBG: No results for input(s): GLUCAP in the last 168 hours. D-Dimer No results for input(s): DDIMER in the last 72 hours. Hgb A1c No results for input(s): HGBA1C in the last 72 hours. Lipid Profile No results for input(s): CHOL, HDL, LDLCALC, TRIG, CHOLHDL, LDLDIRECT in the last 72 hours. Thyroid function studies No results for input(s): TSH, T4TOTAL, T3FREE, THYROIDAB in the last 72 hours.  Invalid input(s): FREET3 Anemia work up No results for input(s): VITAMINB12, FOLATE, FERRITIN, TIBC, IRON, RETICCTPCT in the last 72 hours. Urinalysis    Component Value Date/Time   COLORURINE STRAW (A) 08/04/2016 0448   APPEARANCEUR CLOUDY (A) 08/04/2016 0448   LABSPEC 1.011 08/04/2016 0448   PHURINE 5.5 08/04/2016 0448   GLUCOSEU 100 (A) 08/04/2016 0448   HGBUR SMALL (A) 08/04/2016 0448   BILIRUBINUR NEGATIVE 08/04/2016 0448   KETONESUR NEGATIVE 08/04/2016 0448   PROTEINUR >300 (A) 08/04/2016 0448   NITRITE NEGATIVE 08/04/2016 0448   LEUKOCYTESUR NEGATIVE 08/04/2016 0448   Sepsis Labs Invalid input(s): PROCALCITONIN,  WBC,  LACTICIDVEN Microbiology Recent Results (from the past 240 hour(s))  Urine culture     Status: None   Collection Time: 08/04/16 10:17 AM  Result Value Ref Range Status   Specimen  Description URINE, CLEAN CATCH  Final   Special Requests NONE  Final   Culture NO GROWTH  Final   Report Status 08/05/2016 FINAL  Final  Culture, sputum-assessment     Status: None   Collection Time: 08/07/16  5:17 AM  Result Value Ref Range Status   Specimen Description EXPECTORATED SPUTUM  Final   Special Requests NONE  Final   Sputum evaluation   Final    MICROSCOPIC FINDINGS SUGGEST THAT THIS SPECIMEN IS NOT REPRESENTATIVE OF LOWER RESPIRATORY SECRETIONS. PLEASE RECOLLECT. Gram Stain Report Called to,Read Back By and Verified With: A BAKER,RN @0722  08/07/16 MKELLY,MLT    Report Status 08/07/2016 FINAL  Final     Time coordinating discharge: Over 30 minutes  SIGNED:  Dessa Phi, DO [facility] Pager [phone]  If 7PM-7AM, please contact night-coverage [email or web address] Password [facility] 08/12/2016, 2:34 PM

## 2016-08-13 DIAGNOSIS — I120 Hypertensive chronic kidney disease with stage 5 chronic kidney disease or end stage renal disease: Secondary | ICD-10-CM | POA: Insufficient documentation

## 2016-08-13 DIAGNOSIS — D509 Iron deficiency anemia, unspecified: Secondary | ICD-10-CM | POA: Insufficient documentation

## 2016-08-13 DIAGNOSIS — R519 Headache, unspecified: Secondary | ICD-10-CM | POA: Insufficient documentation

## 2016-08-13 DIAGNOSIS — N2581 Secondary hyperparathyroidism of renal origin: Secondary | ICD-10-CM | POA: Insufficient documentation

## 2016-08-13 DIAGNOSIS — Z23 Encounter for immunization: Secondary | ICD-10-CM | POA: Insufficient documentation

## 2016-08-13 DIAGNOSIS — R52 Pain, unspecified: Secondary | ICD-10-CM | POA: Insufficient documentation

## 2016-08-13 DIAGNOSIS — E440 Moderate protein-calorie malnutrition: Secondary | ICD-10-CM | POA: Insufficient documentation

## 2016-08-13 DIAGNOSIS — D689 Coagulation defect, unspecified: Secondary | ICD-10-CM | POA: Insufficient documentation

## 2016-08-13 DIAGNOSIS — R197 Diarrhea, unspecified: Secondary | ICD-10-CM | POA: Insufficient documentation

## 2016-09-05 ENCOUNTER — Other Ambulatory Visit: Payer: Self-pay | Admitting: *Deleted

## 2016-09-05 DIAGNOSIS — M79642 Pain in left hand: Secondary | ICD-10-CM

## 2016-09-08 ENCOUNTER — Ambulatory Visit: Payer: Self-pay | Admitting: Family Medicine

## 2016-09-08 ENCOUNTER — Encounter: Payer: Self-pay | Admitting: Vascular Surgery

## 2016-09-09 ENCOUNTER — Other Ambulatory Visit: Payer: Self-pay

## 2016-09-09 ENCOUNTER — Ambulatory Visit (HOSPITAL_COMMUNITY)
Admission: RE | Admit: 2016-09-09 | Discharge: 2016-09-09 | Disposition: A | Discharge status: Home / Self Care | Payer: Medicaid Other | Source: Ambulatory Visit | Attending: Vascular Surgery | Admitting: Vascular Surgery

## 2016-09-09 ENCOUNTER — Ambulatory Visit (INDEPENDENT_AMBULATORY_CARE_PROVIDER_SITE_OTHER): Payer: Self-pay | Admitting: Vascular Surgery

## 2016-09-09 VITALS — BP 109/68 | HR 99 | Temp 98.0°F | Resp 18

## 2016-09-09 DIAGNOSIS — M79642 Pain in left hand: Secondary | ICD-10-CM

## 2016-09-09 DIAGNOSIS — T82898A Other specified complication of vascular prosthetic devices, implants and grafts, initial encounter: Secondary | ICD-10-CM

## 2016-09-09 NOTE — Progress Notes (Signed)
   Patient name: Jordan [name] MRN: [id number] DOB: April 09, 1966 Sex: male  REASON FOR VISIT: left hand numbness post-op access  HPI: Jordan [name] is a 50 y.o. male who presents for his first post-operative visit s/p left 08/07/2016. On postop day 1, he did complain of some numbness to his left hand. He stated that this was tolerable. Since his surgery, he has complained of progressive numbness to his left hand. He states that it is worse during dialysis. He says that he had to end his dialysis session early yesterday secondary to this numbness. He dialyzes on Mondays, Wednesdays and Fridays.  Current Outpatient Prescriptions  Medication Sig Dispense Refill  . amLODipine (NORVASC) 10 MG tablet Take 1 tablet (10 mg total) by mouth daily. 30 tablet 0  . carvedilol (COREG) 6.25 MG tablet Take 1 tablet (6.25 mg total) by mouth 2 (two) times daily with a meal. 60 tablet 0   No current facility-administered medications for this visit.     REVIEW OF SYSTEMS:  [X]  denotes positive finding, [ ]  denotes negative finding Cardiac  Comments:  Chest pain or chest pressure:    Shortness of breath upon exertion:    Short of breath when lying flat:    Irregular heart rhythm:    Constitutional    Fever or chills:      PHYSICAL EXAM: Vitals:   09/09/16 1416  BP: 109/68  Pulse: 99  Resp: 18  Temp: 98 F (36.7 C)  SpO2: 98%    GENERAL: The patient is a well-nourished male, in no acute distress. The vital signs are documented above. CARDIOVASCULAR: There is a regular rate and rhythm.Right IJ TDC. PULMONARY: Non labored respiratory effort. VASCULAR: 1+ left radial pulse. Excellent thrill left upper arm fistula. Left upper arm incisions healing well. Mild clear drainage from middle incision. 2+ right radial pulse. Easily visible left forearm cephalic vein.   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 This numbness is worse with dialysis. He is currently dialyzing via a right IJ tunneled dialysis catheter on Mondays, Wednesdays and Fridays. Patient was seen with Dr. early who recommends ligation of left arm fistula. The patient understands that he will need new access in the right arm. On exam, there appears to be a adequate right cephalic vein at the wrist. Plan for ligation of left basilic vein fistula with creation of new right arm fistula on 09/12/2016 with Dr. Scot Dock. He will have dialysis scheduled for this Thursday.  Virgina Jock, PA-C [facility] [place]

## 2016-09-11 ENCOUNTER — Encounter (HOSPITAL_COMMUNITY): Payer: Self-pay | Admitting: *Deleted

## 2016-09-11 NOTE — Progress Notes (Signed)
Spoke with pt for pre-op call. Pt denies cardiac history, chest pain or sob. Pt states he can't afford his bp medications and has not had any for at least 4 days. He states he's going to try and get them filled at [facility]

## 2016-09-12 ENCOUNTER — Encounter (HOSPITAL_COMMUNITY)
Admission: RE | Disposition: A | Discharge status: Home / Self Care | Payer: Self-pay | Source: Ambulatory Visit | Attending: Vascular Surgery

## 2016-09-12 ENCOUNTER — Ambulatory Visit (HOSPITAL_COMMUNITY)
Admission: RE | Admit: 2016-09-12 | Discharge: 2016-09-12 | Disposition: A | Discharge status: Home / Self Care | Payer: Medicaid Other | Source: Ambulatory Visit | Attending: Vascular Surgery | Admitting: Vascular Surgery

## 2016-09-12 ENCOUNTER — Encounter (HOSPITAL_COMMUNITY): Payer: Self-pay | Admitting: *Deleted

## 2016-09-12 ENCOUNTER — Ambulatory Visit (HOSPITAL_COMMUNITY): Payer: Medicaid Other | Admitting: Anesthesiology

## 2016-09-12 DIAGNOSIS — Z79899 Other long term (current) drug therapy: Secondary | ICD-10-CM | POA: Diagnosis not present

## 2016-09-12 DIAGNOSIS — F17200 Nicotine dependence, unspecified, uncomplicated: Secondary | ICD-10-CM | POA: Diagnosis not present

## 2016-09-12 DIAGNOSIS — Z992 Dependence on renal dialysis: Secondary | ICD-10-CM | POA: Diagnosis not present

## 2016-09-12 DIAGNOSIS — N186 End stage renal disease: Secondary | ICD-10-CM | POA: Diagnosis not present

## 2016-09-12 DIAGNOSIS — T82898A Other specified complication of vascular prosthetic devices, implants and grafts, initial encounter: Secondary | ICD-10-CM | POA: Insufficient documentation

## 2016-09-12 DIAGNOSIS — I120 Hypertensive chronic kidney disease with stage 5 chronic kidney disease or end stage renal disease: Secondary | ICD-10-CM | POA: Diagnosis not present

## 2016-09-12 DIAGNOSIS — Y832 Surgical operation with anastomosis, bypass or graft as the cause of abnormal reaction of the patient, or of later complication, without mention of misadventure at the time of the procedure: Secondary | ICD-10-CM | POA: Diagnosis not present

## 2016-09-12 HISTORY — DX: Essential (primary) hypertension: I10

## 2016-09-12 HISTORY — DX: Anemia, unspecified: D64.9

## 2016-09-12 HISTORY — PX: LIGATION OF ARTERIOVENOUS  FISTULA: SHX5948

## 2016-09-12 LAB — POCT I-STAT 4, (NA,K, GLUC, HGB,HCT)
Glucose, Bld: 79 mg/dL (ref 65–99)
HCT: 34 % — ABNORMAL LOW (ref 39.0–52.0)
Hemoglobin: 11.6 g/dL — ABNORMAL LOW (ref 13.0–17.0)
Potassium: 4.2 mmol/L (ref 3.5–5.1)
Sodium: 140 mmol/L (ref 135–145)

## 2016-09-12 SURGERY — LIGATION OF ARTERIOVENOUS  FISTULA
Anesthesia: General | Site: Arm Upper | Laterality: Left

## 2016-09-12 MED ORDER — FENTANYL CITRATE (PF) 250 MCG/5ML IJ SOLN
INTRAMUSCULAR | Status: AC
  Filled 2016-09-12: qty 5

## 2016-09-12 MED ORDER — PHENYLEPHRINE HCL 10 MG/ML IJ SOLN
INTRAMUSCULAR | Status: DC | PRN
  Administered 2016-09-12: 80 ug via INTRAVENOUS
  Administered 2016-09-12: 120 ug via INTRAVENOUS
  Administered 2016-09-12: 80 ug via INTRAVENOUS

## 2016-09-12 MED ORDER — 0.9 % SODIUM CHLORIDE (POUR BTL) OPTIME
TOPICAL | Status: DC | PRN
  Administered 2016-09-12: 1000 mL

## 2016-09-12 MED ORDER — LIDOCAINE HCL (CARDIAC) 20 MG/ML IV SOLN
INTRAVENOUS | Status: DC | PRN
  Administered 2016-09-12: 70 mg via INTRATRACHEAL

## 2016-09-12 MED ORDER — THROMBIN 20000 UNITS EX SOLR
CUTANEOUS | Status: AC
  Filled 2016-09-12: qty 20000

## 2016-09-12 MED ORDER — MIDAZOLAM HCL 2 MG/2ML IJ SOLN
INTRAMUSCULAR | Status: AC
  Filled 2016-09-12: qty 2

## 2016-09-12 MED ORDER — OXYCODONE-ACETAMINOPHEN 5-325 MG PO TABS: 1.0000 | ORAL_TABLET | Freq: Four times a day (QID) | ORAL | 0 refills | Status: DC | PRN

## 2016-09-12 MED ORDER — SODIUM CHLORIDE 0.9 % IV SOLN
INTRAVENOUS | Status: DC
  Administered 2016-09-12 (×2): via INTRAVENOUS

## 2016-09-12 MED ORDER — PROPOFOL 10 MG/ML IV BOLUS
INTRAVENOUS | Status: DC | PRN
  Administered 2016-09-12: 180 mg via INTRAVENOUS

## 2016-09-12 MED ORDER — PHENYLEPHRINE 40 MCG/ML (10ML) SYRINGE FOR IV PUSH (FOR BLOOD PRESSURE SUPPORT)
PREFILLED_SYRINGE | INTRAVENOUS | Status: AC
  Filled 2016-09-12: qty 10

## 2016-09-12 MED ORDER — MIDAZOLAM HCL 5 MG/5ML IJ SOLN
INTRAMUSCULAR | Status: DC | PRN
  Administered 2016-09-12: 2 mg via INTRAVENOUS

## 2016-09-12 MED ORDER — BACITRACIN ZINC 500 UNIT/GM EX OINT
TOPICAL_OINTMENT | CUTANEOUS | Status: AC
  Filled 2016-09-12: qty 28.35

## 2016-09-12 MED ORDER — DEXTROSE 5 % IV SOLN
1.5000 g | INTRAVENOUS | Status: AC
  Administered 2016-09-12: 1.5 g via INTRAVENOUS

## 2016-09-12 MED ORDER — EPHEDRINE SULFATE 50 MG/ML IJ SOLN
INTRAMUSCULAR | Status: DC | PRN
  Administered 2016-09-12 (×3): 15 mg via INTRAVENOUS

## 2016-09-12 MED ORDER — HEPARIN SODIUM (PORCINE) 5000 UNIT/ML IJ SOLN
INTRAMUSCULAR | Status: DC | PRN
  Administered 2016-09-12: 500 mL

## 2016-09-12 MED ORDER — PAPAVERINE HCL 30 MG/ML IJ SOLN
INTRAMUSCULAR | Status: AC
  Filled 2016-09-12: qty 2

## 2016-09-12 MED ORDER — DEXTROSE 5 % IV SOLN
INTRAVENOUS | Status: AC
  Filled 2016-09-12: qty 1.5

## 2016-09-12 MED ORDER — ONDANSETRON HCL 4 MG/2ML IJ SOLN
INTRAMUSCULAR | Status: AC
  Filled 2016-09-12: qty 2

## 2016-09-12 MED ORDER — ONDANSETRON HCL 4 MG/2ML IJ SOLN
INTRAMUSCULAR | Status: DC | PRN
  Administered 2016-09-12: 4 mg via INTRAVENOUS

## 2016-09-12 MED ORDER — CHLORHEXIDINE GLUCONATE CLOTH 2 % EX PADS: 6.0000 | MEDICATED_PAD | Freq: Once | CUTANEOUS | Status: DC

## 2016-09-12 MED ORDER — LIDOCAINE HCL (PF) 1 % IJ SOLN
INTRAMUSCULAR | Status: AC
  Filled 2016-09-12: qty 30

## 2016-09-12 MED ORDER — PROPOFOL 10 MG/ML IV BOLUS
INTRAVENOUS | Status: AC
  Filled 2016-09-12: qty 20

## 2016-09-12 MED ORDER — EPHEDRINE 5 MG/ML INJ
INTRAVENOUS | Status: AC
  Filled 2016-09-12: qty 10

## 2016-09-12 MED ORDER — OXYCODONE-ACETAMINOPHEN 5-325 MG PO TABS
ORAL_TABLET | ORAL | Status: AC
  Filled 2016-09-12: qty 2

## 2016-09-12 MED ORDER — EPINEPHRINE PF 1 MG/ML IJ SOLN
INTRAMUSCULAR | Status: AC
  Filled 2016-09-12: qty 1

## 2016-09-12 MED ORDER — BACITRACIN 500 UNIT/GM EX OINT
TOPICAL_OINTMENT | CUTANEOUS | Status: DC | PRN
  Administered 2016-09-12: 1 via TOPICAL

## 2016-09-12 MED ORDER — HEPARIN SODIUM (PORCINE) 1000 UNIT/ML IJ SOLN
INTRAMUSCULAR | Status: AC
  Filled 2016-09-12: qty 1

## 2016-09-12 MED ORDER — FENTANYL CITRATE (PF) 250 MCG/5ML IJ SOLN
INTRAMUSCULAR | Status: DC | PRN
  Administered 2016-09-12: 100 ug via INTRAVENOUS
  Administered 2016-09-12: 50 ug via INTRAVENOUS

## 2016-09-12 SURGICAL SUPPLY — 43 items
ADH SKN CLS APL DERMABOND .7 (GAUZE/BANDAGES/DRESSINGS) ×2
ARMBAND PINK RESTRICT EXTREMIT (MISCELLANEOUS) ×2 IMPLANT
CANISTER SUCTION 2500CC (MISCELLANEOUS) ×3 IMPLANT
CANNULA VESSEL 3MM 2 BLNT TIP (CANNULA) ×3 IMPLANT
CLIP TI MEDIUM 6 (CLIP) ×3 IMPLANT
CLIP TI WIDE RED SMALL 6 (CLIP) ×5 IMPLANT
COVER PROBE W GEL 5X96 (DRAPES) IMPLANT
DECANTER SPIKE VIAL GLASS SM (MISCELLANEOUS) ×3 IMPLANT
DERMABOND ADVANCED (GAUZE/BANDAGES/DRESSINGS) ×1
DERMABOND ADVANCED .7 DNX12 (GAUZE/BANDAGES/DRESSINGS) ×2 IMPLANT
DRAPE ORTHO SPLIT 77X108 STRL (DRAPES) ×3
DRAPE SURG ORHT 6 SPLT 77X108 (DRAPES) ×1 IMPLANT
ELECT REM PT RETURN 9FT ADLT (ELECTROSURGICAL) ×3
ELECTRODE REM PT RTRN 9FT ADLT (ELECTROSURGICAL) ×2 IMPLANT
GAUZE SPONGE 4X4 12PLY STRL (GAUZE/BANDAGES/DRESSINGS) ×1 IMPLANT
GEL ULTRASOUND 20GR AQUASONIC (MISCELLANEOUS) IMPLANT
GLOVE BIO SURGEON STRL SZ7.5 (GLOVE) ×3 IMPLANT
GLOVE BIOGEL M 6.5 STRL (GLOVE) ×4 IMPLANT
GLOVE BIOGEL PI IND STRL 8 (GLOVE) ×3 IMPLANT
GLOVE BIOGEL PI INDICATOR 8 (GLOVE) ×2
GOWN STRL REUS W/ TWL LRG LVL3 (GOWN DISPOSABLE) ×6 IMPLANT
GOWN STRL REUS W/TWL LRG LVL3 (GOWN DISPOSABLE) ×9
GRAFT GORETEX 6X10 (Vascular Products) ×2 IMPLANT
KIT BASIN OR (CUSTOM PROCEDURE TRAY) ×3 IMPLANT
KIT ROOM TURNOVER OR (KITS) ×3 IMPLANT
NS IRRIG 1000ML POUR BTL (IV SOLUTION) ×3 IMPLANT
PACK CV ACCESS (CUSTOM PROCEDURE TRAY) ×3 IMPLANT
PAD ARMBOARD 7.5X6 YLW CONV (MISCELLANEOUS) ×6 IMPLANT
SPONGE GAUZE 4X4 12PLY STER LF (GAUZE/BANDAGES/DRESSINGS) ×2 IMPLANT
SPONGE SURGIFOAM ABS GEL 100 (HEMOSTASIS) IMPLANT
STOCKINETTE 6  STRL (DRAPES) ×1
STOCKINETTE 6 STRL (DRAPES) ×1 IMPLANT
SUT ETHILON 3 0 PS 1 (SUTURE) IMPLANT
SUT PROLENE 6 0 BV (SUTURE) ×7 IMPLANT
SUT SILK 0 TIES 10X30 (SUTURE) ×3 IMPLANT
SUT VIC AB 3-0 SH 27 (SUTURE) ×3
SUT VIC AB 3-0 SH 27X BRD (SUTURE) ×2 IMPLANT
SUT VICRYL 4-0 PS2 18IN ABS (SUTURE) ×3 IMPLANT
SWAB COLLECTION DEVICE MRSA (MISCELLANEOUS) IMPLANT
TAPE CLOTH SURG 4X10 WHT LF (GAUZE/BANDAGES/DRESSINGS) ×2 IMPLANT
TUBE ANAEROBIC SPECIMEN COL (MISCELLANEOUS) IMPLANT
UNDERPAD 30X30 (UNDERPADS AND DIAPERS) ×3 IMPLANT
WATER STERILE IRR 1000ML POUR (IV SOLUTION) ×3 IMPLANT

## 2016-09-12 NOTE — Op Note (Signed)
    NAME: Jordan [name]   MRN: [id number] DOB: 27-Jan-1966    DATE OF OPERATION: 09/12/2016  PREOP DIAGNOSIS: Steal syndrome left arm  POSTOP DIAGNOSIS: Same  PROCEDURE: Banding of left basilic vein transposition  SURGEON: Judeth Cornfield. Scot Dock, MD, FACS  ASSIST: Silva Bandy, [facility]  ANESTHESIA: Gen.   EBL: Minimal  INDICATIONS: Jordan [name] is a 50 y.o. male who had a left basilic vein transposition placed. He developed steal symptoms. I recommended banding of his fistula before proceeding with ligation of the fistula. He was agreeable with this plan.  FINDINGS: Palpable radial pulse at the completion with a biphasic radial and ulnar signal. Still with good thrill and AV fistula.  TECHNIQUE: The patient was taken to the operating room and received a general anesthetic. The left upper extremity was prepped and draped in usual sterile fashion. A longitudinal incision was made over the proximal fistula. Here the fistula was dissected free. A 6 mm PTFE graft was brought to the field and a segment of this was cut and then opened longitudinally. This was then sewn with interrupted 6-0 Prolene in order to narrow the fistula slightly. At the completion there was a biphasic radial and ulnar signal with Doppler. Hemostasis was obtained in the wound. The wound was closed with deep 3-0 Vicryl and the skin closed with 4-0 Vicryl. Dermabond was applied. The patient tolerated the procedure well and was transferred to the recovery room in stable condition. All needle and sponge counts were correct.  [name], MD, FACS [facility]  DATE OF DICTATION:   09/12/2016

## 2016-09-12 NOTE — Interval H&P Note (Signed)
History and Physical Interval Note:  09/12/2016 11:34 AM  Jordan [name]  has presented today for surgery, with the diagnosis of Steal Syndrome Left Basilic Vein Transposition T82.510 End Stage Renal Disease N18.6  The various methods of treatment have been discussed with the patient and family. After consideration of risks, benefits and other options for treatment, the patient has consented to  Procedure(s): LIGATION OF ARTERIOVENOUS  FISTULA-LEFT BASILIC VEIN TRANSPOSITION (Left) ARTERIOVENOUS (AV) FISTULA CREATION-RIGHT ARM (Right) as a surgical intervention .  The patient's history has been reviewed, patient examined, no change in status, stable for surgery.  I have reviewed the patient's chart and labs.  Questions were answered to the patient's satisfaction.     [name]

## 2016-09-12 NOTE — Anesthesia Postprocedure Evaluation (Signed)
Anesthesia Post Note  Patient: Jordan [name]  Procedure(s) Performed: Procedure(s) (LRB): BANDING OF LEFT  ARTERIOVENOUS  FISTULA (Left)  Patient location during evaluation: PACU Anesthesia Type: General Level of consciousness: awake and alert and patient cooperative Pain management: pain level controlled Vital Signs Assessment: post-procedure vital signs reviewed and stable Respiratory status: spontaneous breathing and respiratory function stable Cardiovascular status: stable Anesthetic complications: no       Last Vitals:  Vitals:   09/12/16 1409 09/12/16 1420  BP: 104/68 (!) 137/94  Pulse: 87 85  Resp: 19 16  Temp: 36.9 C     Last Pain:  Vitals:   09/12/16 0900  TempSrc: Oral                 [facility] S

## 2016-09-12 NOTE — Anesthesia Preprocedure Evaluation (Signed)
Anesthesia Evaluation  Patient identified by MRN, date of birth, ID band Patient awake    Reviewed: Allergy & Precautions, H&P , NPO status , Patient's Chart, lab work & pertinent test results  Airway Mallampati: II   Neck ROM: full    Dental   Pulmonary Current Smoker,    breath sounds clear to auscultation       Cardiovascular hypertension,  Rhythm:regular Rate:Normal     Neuro/Psych    GI/Hepatic (+)     substance abuse  alcohol use,   Endo/Other    Renal/GU ESRF and DialysisRenal disease     Musculoskeletal   Abdominal   Peds  Hematology   Anesthesia Other Findings   Reproductive/Obstetrics                             Anesthesia Physical Anesthesia Plan  ASA: III  Anesthesia Plan: General   Post-op Pain Management:    Induction: Intravenous  Airway Management Planned: LMA  Additional Equipment:   Intra-op Plan:   Post-operative Plan:   Informed Consent: I have reviewed the patients History and Physical, chart, labs and discussed the procedure including the risks, benefits and alternatives for the proposed anesthesia with the patient or authorized representative who has indicated his/her understanding and acceptance.     Plan Discussed with: CRNA, Anesthesiologist and Surgeon  Anesthesia Plan Comments:         Anesthesia Quick Evaluation

## 2016-09-12 NOTE — Anesthesia Procedure Notes (Signed)
Procedure Name: LMA Insertion Date/Time: 09/12/2016 12:12 PM Performed by: Mariea Clonts Pre-anesthesia Checklist: Patient identified, Emergency Drugs available, Suction available and Patient being monitored Patient Re-evaluated:Patient Re-evaluated prior to inductionOxygen Delivery Method: Circle System Utilized Preoxygenation: Pre-oxygenation with 100% oxygen Intubation Type: IV induction Ventilation: Mask ventilation without difficulty LMA: LMA inserted LMA Size: 4.0 Number of attempts: 1 Airway Equipment and Method: Bite block Placement Confirmation: positive ETCO2 Tube secured with: Tape Dental Injury: Teeth and Oropharynx as per pre-operative assessment

## 2016-09-12 NOTE — Transfer of Care (Signed)
Immediate Anesthesia Transfer of Care Note  Patient: Jordan [name]  Procedure(s) Performed: Procedure(s): BANDING OF LEFT  ARTERIOVENOUS  FISTULA (Left)  Patient Location: PACU  Anesthesia Type:General  Level of Consciousness: awake, alert  and oriented  Airway & Oxygen Therapy: Patient Spontanous Breathing and Patient connected to nasal cannula oxygen  Post-op Assessment: Report given to RN, Post -op Vital signs reviewed and stable and Patient moving all extremities X 4  Post vital signs: Reviewed and stable  Last Vitals:  Vitals:   09/12/16 0900 09/12/16 1310  BP: (!) 138/92 107/69  Pulse: 84 (!) 102  Resp: 18 17  Temp: 36.4 C 36.5 C    Last Pain:  Vitals:   09/12/16 0900  TempSrc: Oral      Patients Stated Pain Goal: (P) 0 (63/78/58 [id number])  Complications: No apparent anesthesia complications

## 2016-09-12 NOTE — H&P (View-Only) (Signed)
   Patient name: Jordan [name] MRN: [id number] DOB: [birth year] Sex: male  REASON FOR VISIT: left hand numbness post-op access  HPI: CLYDE ZARRELLA is a 50 y.o. male who presents for his first post-operative visit s/p left 08/07/2016. On postop day 1, he did complain of some numbness to his left hand. He stated that this was tolerable. Since his surgery, he has complained of progressive numbness to his left hand. He states that it is worse during dialysis. He says that he had to end his dialysis session early yesterday secondary to this numbness. He dialyzes on Mondays, Wednesdays and Fridays.  Current Outpatient Prescriptions  Medication Sig Dispense Refill  . amLODipine (NORVASC) 10 MG tablet Take 1 tablet (10 mg total) by mouth daily. 30 tablet 0  . carvedilol (COREG) 6.25 MG tablet Take 1 tablet (6.25 mg total) by mouth 2 (two) times daily with a meal. 60 tablet 0   No current facility-administered medications for this visit.     REVIEW OF SYSTEMS:  [X]  denotes positive finding, [ ]  denotes negative finding Cardiac  Comments:  Chest pain or chest pressure:    Shortness of breath upon exertion:    Short of breath when lying flat:    Irregular heart rhythm:    Constitutional    Fever or chills:      PHYSICAL EXAM: Vitals:   09/09/16 1416  BP: 109/68  Pulse: 99  Resp: 18  Temp: 98 F (36.7 C)  SpO2: 98%    GENERAL: The patient is a well-nourished male, in no acute distress. The vital signs are documented above. CARDIOVASCULAR: There is a regular rate and rhythm.Right IJ TDC. PULMONARY: Non labored respiratory effort. VASCULAR: 1+ left radial pulse. Excellent thrill left upper arm fistula. Left upper arm incisions healing well. Mild clear drainage from middle incision. 2+ right radial pulse. Easily visible left forearm cephalic vein.   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 This numbness is worse with dialysis. He is currently dialyzing via a right IJ tunneled dialysis catheter on Mondays, Wednesdays and Fridays. Patient was seen with Dr. early who recommends ligation of left arm fistula. The patient understands that he will need new access in the right arm. On exam, there appears to be a adequate right cephalic vein at the wrist. Plan for ligation of left basilic vein fistula with creation of new right arm fistula on 09/12/2016 with Dr. Scot Dock. He will have dialysis scheduled for this Thursday.  Virgina Jock, PA-C [facility] [place]

## 2016-09-16 ENCOUNTER — Encounter (HOSPITAL_COMMUNITY): Payer: Self-pay | Admitting: Vascular Surgery

## 2016-09-17 ENCOUNTER — Emergency Department
Admission: EM | Admit: 2016-09-17 | Discharge: 2016-09-17 | Disposition: A | Discharge status: Home / Self Care | Payer: Medicaid Other | Attending: Emergency Medicine | Admitting: Emergency Medicine

## 2016-09-17 ENCOUNTER — Emergency Department: Payer: Medicaid Other

## 2016-09-17 ENCOUNTER — Encounter (HOSPITAL_COMMUNITY): Payer: Self-pay

## 2016-09-17 DIAGNOSIS — I129 Hypertensive chronic kidney disease with stage 1 through stage 4 chronic kidney disease, or unspecified chronic kidney disease: Secondary | ICD-10-CM | POA: Insufficient documentation

## 2016-09-17 DIAGNOSIS — N189 Chronic kidney disease, unspecified: Secondary | ICD-10-CM | POA: Insufficient documentation

## 2016-09-17 DIAGNOSIS — F17210 Nicotine dependence, cigarettes, uncomplicated: Secondary | ICD-10-CM | POA: Diagnosis not present

## 2016-09-17 DIAGNOSIS — R0789 Other chest pain: Secondary | ICD-10-CM

## 2016-09-17 DIAGNOSIS — Z79899 Other long term (current) drug therapy: Secondary | ICD-10-CM | POA: Diagnosis not present

## 2016-09-17 LAB — TROPONIN I
Troponin I: <0.03 ng/mL (ref <0.03)
Troponin I: <0.03 ng/mL (ref <0.03)

## 2016-09-17 LAB — BASIC METABOLIC PANEL
Anion gap: 12 (ref 5–15)
BUN: 45 mg/dL — ABNORMAL HIGH (ref 6–20)
CO2: 26 mmol/L (ref 22–32)
Calcium: 9.1 mg/dL (ref 8.9–10.3)
Chloride: 101 mmol/L (ref 101–111)
Creatinine, Ser: 9.86 mg/dL — ABNORMAL HIGH (ref 0.61–1.24)
GFR calc Af Amer: 6 mL/min — ABNORMAL LOW (ref >60)
GFR calc non Af Amer: 5 mL/min — ABNORMAL LOW (ref >60)
Glucose, Bld: 62 mg/dL — ABNORMAL LOW (ref 65–99)
Potassium: 4.7 mmol/L (ref 3.5–5.1)
Sodium: 139 mmol/L (ref 135–145)

## 2016-09-17 LAB — CBC
HCT: 33.7 % — ABNORMAL LOW (ref 40.0–52.0)
Hemoglobin: 11.4 g/dL — ABNORMAL LOW (ref 13.0–18.0)
MCH: 32.6 pg (ref 26.0–34.0)
MCHC: 33.7 g/dL (ref 32.0–36.0)
MCV: 96.7 fL (ref 80.0–100.0)
Platelets: 157 10*3/uL (ref 150–440)
RBC: 3.49 MIL/uL — ABNORMAL LOW (ref 4.40–5.90)
RDW: 20.9 % — ABNORMAL HIGH (ref 11.5–14.5)
WBC: 6.9 10*3/uL (ref 3.8–10.6)

## 2016-09-17 IMAGING — CR DG CHEST 2V
2 series · 2 of 2 positions shown · non-contrast
Comparison: [DATE]

CLINICAL DATA: Shortness of Breath, chest pain after dialysis this
morning

EXAM:
CHEST  2 VIEW

[chest lat]
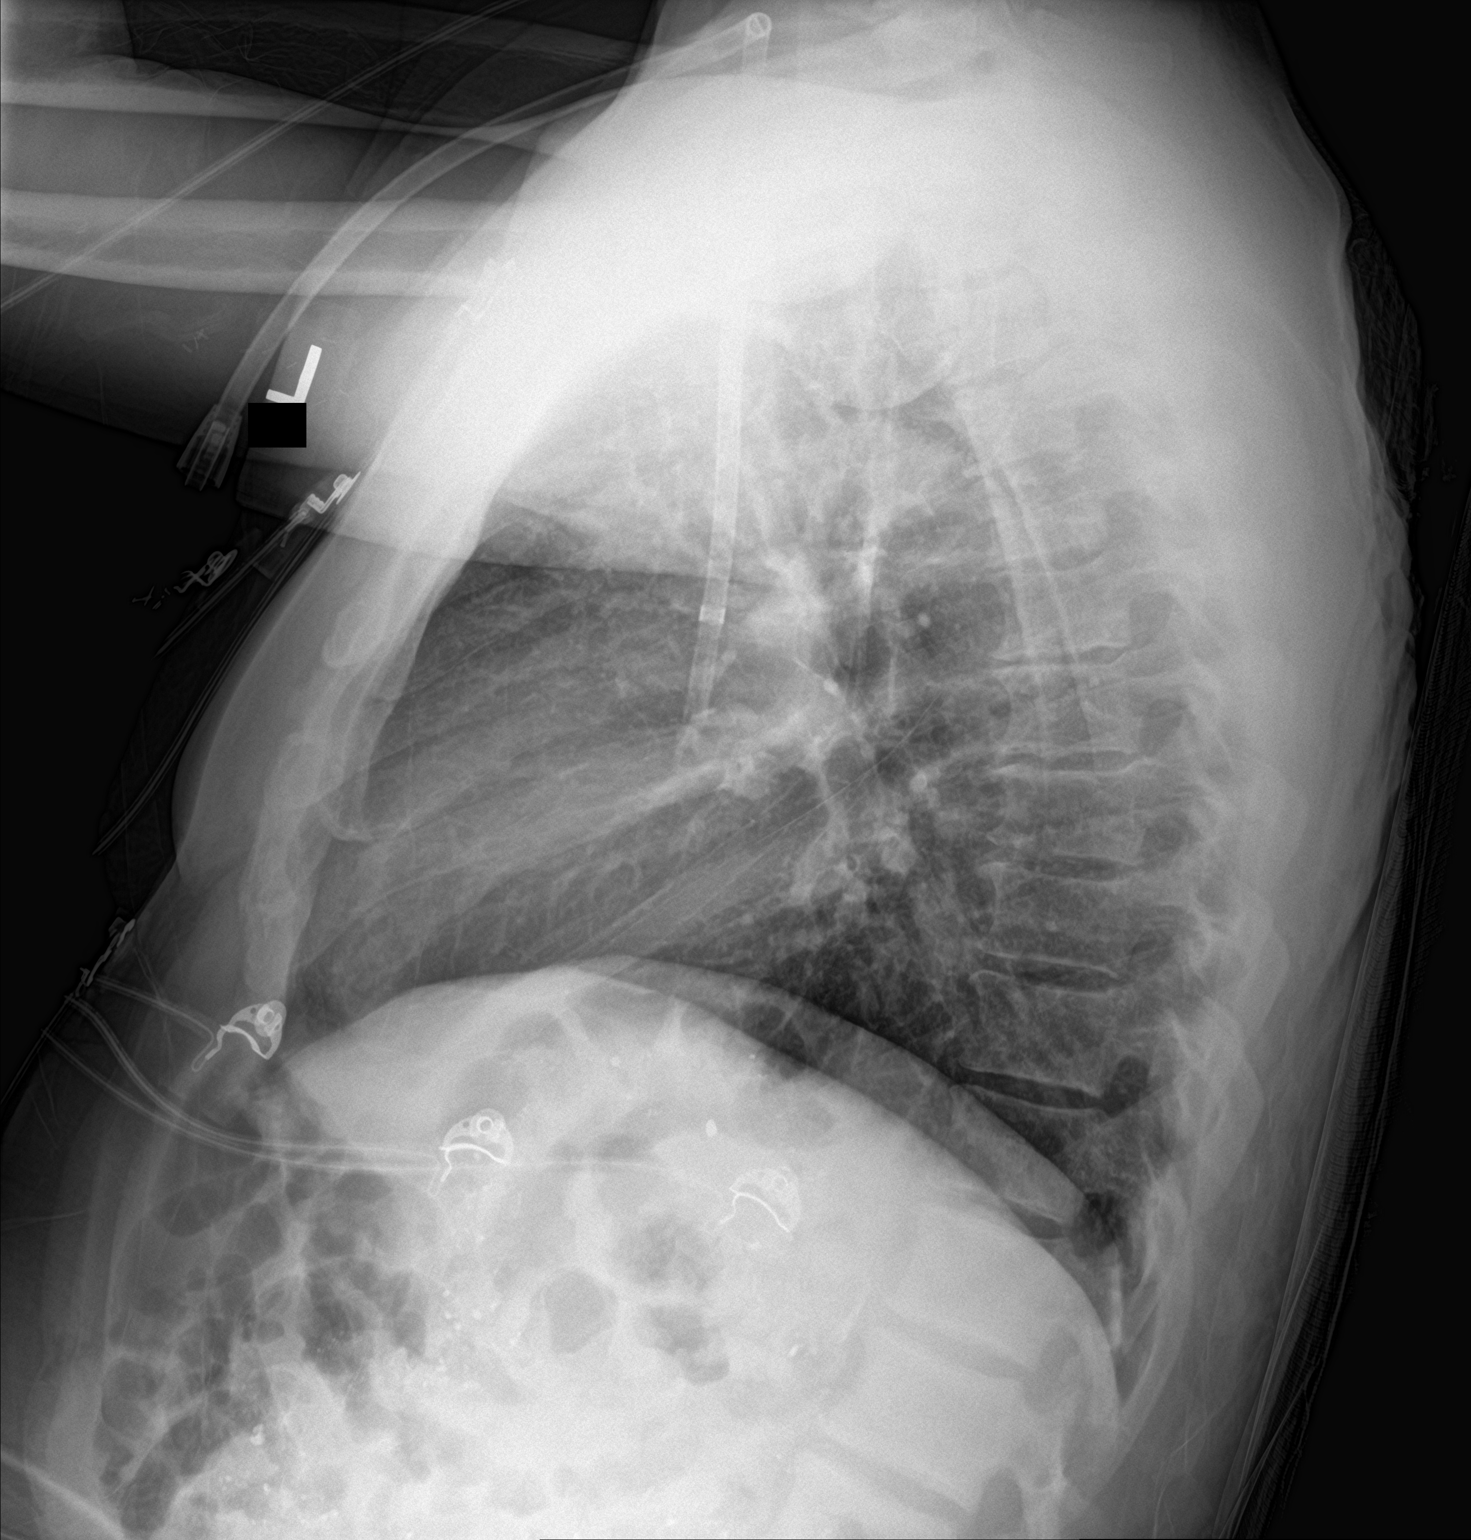

[chest ap]
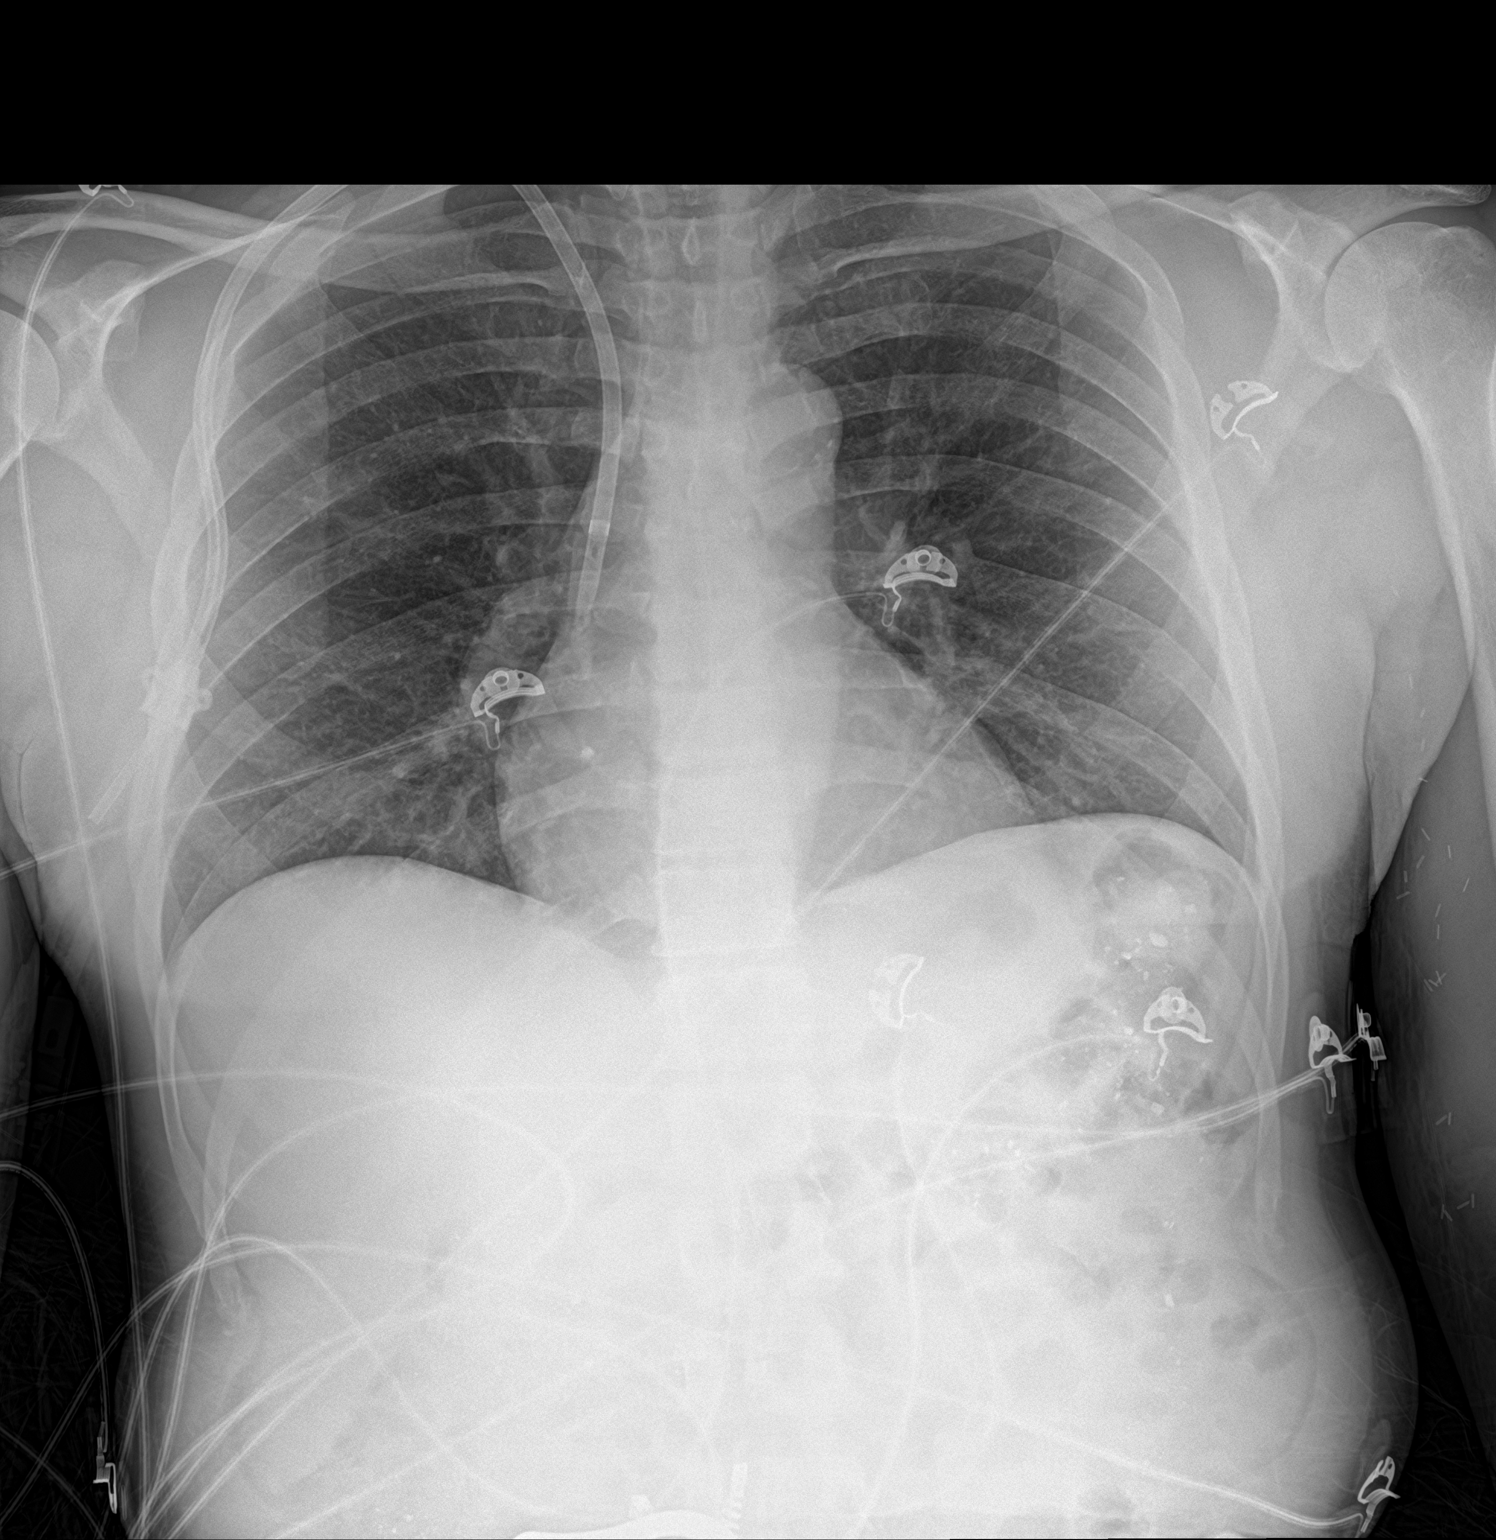

[2 of 2 positions shown; findings below may reference images not displayed]

FINDINGS: Cardiomediastinal silhouette is stable. Right dialysis catheter is
unchanged in position. No infiltrate or pleural effusion. No
pulmonary edema. No pneumothorax.
IMPRESSION: No active cardiopulmonary disease.

## 2016-09-17 NOTE — Discharge Instructions (Signed)

## 2016-09-17 NOTE — ED Triage Notes (Signed)
Pt comes into the ED via EMS from [facility] with c/o substernal chest pain that started about 1hr into dialysis, states pain is much better now 4/10.Jordan [name] Pt is not in any acute distress. ASA 324mg  given PTA.Jordan [name]

## 2016-09-17 NOTE — ED Provider Notes (Signed)
[facility] Emergency Department Provider Note   ____________________________________________    I have reviewed the triage vital signs and the nursing notes.   HISTORY  Chief Complaint Chest Pain     HPI Jordan [name] is a 50 y.o. male who presents with complaints of chest pain. Patient reports he was receiving dialysis, had had one hour and had a brief episode of chest discomfort in his central chest which he described as tightness. This resolved rapidly and currently feels well and has no complaints. No history of heart disease. No shortness of breath. No palpitations. No calf pain or swelling.   Past Medical History:  Diagnosis Date  . Anemia   . CKD (chronic kidney disease)    Stage 5  Dialysis - M/W/F  . ETOH abuse   . Hypertension     Patient Active Problem List   Diagnosis Date Noted  . Tobacco abuse 08/04/2016  . ETOH abuse 08/04/2016  . Uncontrolled hypertension 08/04/2016  . Acute renal failure (ARF) ([place]) 08/04/2016  . Symptomatic anemia 08/04/2016  . Hypertensive emergency     Past Surgical History:  Procedure Laterality Date  . BASCILIC VEIN TRANSPOSITION Left 08/07/2016   Procedure: LEFT BASILIC VEIN TRANSPOSITION;  Surgeon: Angelia Mould, MD;  Location: [place];  Service: Vascular;  Laterality: Left;  . COLONOSCOPY N/A 08/12/2016   Procedure: COLONOSCOPY;  Surgeon: Otis Brace, MD;  Location: [place];  Service: Gastroenterology;  Laterality: N/A;  . ESOPHAGOGASTRODUODENOSCOPY N/A 08/12/2016   Procedure: ESOPHAGOGASTRODUODENOSCOPY (EGD);  Surgeon: Otis Brace, MD;  Location: [place];  Service: Gastroenterology;  Laterality: N/A;  . INSERTION OF DIALYSIS CATHETER N/A 08/07/2016   Procedure: INSERTION OF TUNNELED DIALYSIS CATHETER;  Surgeon: Angelia Mould, MD;  Location: [place];  Service: Vascular;  Laterality: N/A;  . LIGATION OF ARTERIOVENOUS  FISTULA Left 09/12/2016   Procedure: BANDING  OF LEFT  ARTERIOVENOUS  FISTULA;  Surgeon: Angelia Mould, MD;  Location: [facility] OR;  Service: Vascular;  Laterality: Left;  . NO PAST SURGERIES      Prior to Admission medications   Medication Sig Start Date End Date Taking? Authorizing Provider  amLODipine (NORVASC) 10 MG tablet Take 1 tablet (10 mg total) by mouth daily. 08/13/16  Yes Jennifer Chahn-Yang Choi, DO  carvedilol (COREG) 6.25 MG tablet Take 1 tablet (6.25 mg total) by mouth 2 (two) times daily with a meal. 08/12/16  Yes Jennifer Chahn-Yang Choi, DO  ibuprofen (ADVIL,MOTRIN) 200 MG tablet Take 400 mg by mouth every 6 (six) hours as needed for headache or moderate pain.    Historical Provider, MD  oxyCODONE-acetaminophen (PERCOCET/ROXICET) 5-325 MG tablet Take 1 tablet by mouth every 6 (six) hours as needed. Patient not taking: Reported on 09/17/2016 09/12/16   Alvia Grove, PA-C     Allergies Patient has no known allergies.  Family History  Problem Relation Age of Onset  . Diabetes Mother   . Kidney failure Mother   . Kidney failure Brother     Social History Social History  Substance Use Topics  . Smoking status: Current Every Day Smoker    Packs/day: 0.50    Years: 15.00    Types: Cigarettes  . Smokeless tobacco: Never Used     Comment: down to 1 a day  . Alcohol use 12.6 oz/week    21 Cans of beer per week     Comment: none since 08/04/16    Review of Systems  Constitutional: No fever/chills Eyes:  No visual changes.   Cardiovascular:As above Respiratory: Denies shortness of breath. No cough Gastrointestinal: No abdominal pain.  No nausea, no vomiting.   Genitourinary: Negative for dysuria. Musculoskeletal: Negative for back pain. Skin: Negative for rash. Neurological: Negative for headaches   10-point ROS otherwise negative.  ____________________________________________   PHYSICAL EXAM:  VITAL SIGNS: ED Triage Vitals  Enc Vitals Group     BP 09/17/16 1338 124/89     Pulse Rate  09/17/16 1338 80     Resp 09/17/16 1338 17     Temp 09/17/16 1338 98 F (36.7 C)     Temp Source 09/17/16 1338 Oral     SpO2 09/17/16 1338 100 %     Weight 09/17/16 1339 160 lb (72.6 kg)     Height 09/17/16 1339 5\' 4"  (1.626 m)     Head Circumference --      Peak Flow --      Pain Score 09/17/16 1339 4     Pain Loc --      Pain Edu? --      Excl. in [place]? --     Constitutional: Alert and oriented. No acute distress. Pleasant and interactive Eyes: Conjunctivae are normal.   Nose: No congestion/rhinnorhea. Mouth/Throat: Mucous membranes are moist.    Cardiovascular: Normal rate, regular rhythm. Grossly normal heart sounds.  Good peripheral circulation. Right chest dialysis catheter. Left AV fistula, positive thrill Respiratory: Normal respiratory effort.  No retractions. Lungs CTAB. Gastrointestinal: Soft and nontender. No distention.  No CVA tenderness. Genitourinary: deferred Musculoskeletal: No lower extremity tenderness nor edema.  Warm and well perfused Neurologic:  Normal speech and language. No gross focal neurologic deficits are appreciated.  Skin:  Skin is warm, dry and intact. No rash noted. Psychiatric: Mood and affect are normal. Speech and behavior are normal.  ____________________________________________   LABS (all labs ordered are listed, but only abnormal results are displayed)  Labs Reviewed  BASIC METABOLIC PANEL - Abnormal; Notable for the following:       Result Value   Glucose, Bld 62 (*)    BUN 45 (*)    Creatinine, Ser 9.86 (*)    GFR calc non Af Amer 5 (*)    GFR calc Af Amer 6 (*)    All other components within normal limits  CBC - Abnormal; Notable for the following:    RBC 3.49 (*)    Hemoglobin 11.4 (*)    HCT 33.7 (*)    RDW 20.9 (*)    All other components within normal limits  TROPONIN I   ____________________________________________  EKG  ED ECG REPORT I, Lavonia Drafts, the attending physician, personally viewed and interpreted  this ECG.  Date: 09/17/2016 EKG Time: 1:41 PM Rate: 82 Rhythm: normal sinus rhythm QRS Axis: normal Intervals: normal ST/T Wave abnormalities: normal Conduction Disturbances: none   ____________________________________________  RADIOLOGY  Chest x-ray unremarkable ____________________________________________   PROCEDURES  Procedure(s) performed: No    Critical Care performed: No ____________________________________________   INITIAL IMPRESSION / ASSESSMENT AND PLAN / ED COURSE  Pertinent labs & imaging results that were available during my care of the patient were reviewed by me and considered in my medical decision making (see chart for details).  Patient presents with brief episode of chest pain now resolved. He is well-appearing and in no distress. Initial EKG and lab work is reassuring. We will check a second troponin and if that is normal. The patient will be appropriate for outpatient follow-up as needed. I've  asked Dr. Karma Greaser to follow-up on the second blood test. Anticipate d/c.  Clinical Course    ____________________________________________   FINAL CLINICAL IMPRESSION(S) / ED DIAGNOSES  Final diagnoses:  Atypical chest pain      NEW MEDICATIONS STARTED DURING THIS VISIT:  New Prescriptions   No medications on file     Note:  This document was prepared using Dragon voice recognition software and may include unintentional dictation errors.    Lavonia Drafts, MD 09/17/16 [phone]

## 2016-09-17 NOTE — ED Notes (Signed)
Patient transported to X-ray 

## 2016-09-17 NOTE — ED Notes (Signed)
ED Provider at bedside. 

## 2016-09-17 NOTE — ED Provider Notes (Signed)
-----------------------------------------   3:26 PM on 09/17/2016 -----------------------------------------   Blood pressure 116/83, pulse 83, temperature 98 F (36.7 C), temperature source Oral, resp. rate 14, height 5\' 4"  (1.626 m), weight 72.6 kg, SpO2 100 %.  Assuming care from Dr. Corky Downs.  In short, Jordan [name] is a 50 y.o. male with a chief complaint of Chest Pain .  Refer to the original H&P for additional details.  The current plan of care is to follow up second troponin.   ----------------------------------------- 6:21 PM on 09/17/2016 -----------------------------------------  Patient asymptomatic and ate a meal while he was waiting.  Second troponin was negative.  I gave my usual and customary return precautions.      Hinda Kehr, MD 09/17/16 [phone]

## 2016-09-24 ENCOUNTER — Encounter: Payer: Self-pay | Admitting: Vascular Surgery

## 2016-09-24 ENCOUNTER — Ambulatory Visit (INDEPENDENT_AMBULATORY_CARE_PROVIDER_SITE_OTHER): Payer: Self-pay | Admitting: Vascular Surgery

## 2016-09-24 VITALS — BP 90/57 | HR 104 | Temp 98.1°F | Resp 16 | Ht 64.0 in | Wt 151.0 lb

## 2016-09-24 DIAGNOSIS — Z992 Dependence on renal dialysis: Secondary | ICD-10-CM

## 2016-09-24 DIAGNOSIS — N186 End stage renal disease: Secondary | ICD-10-CM

## 2016-09-24 NOTE — Progress Notes (Signed)
   Patient name: Jordan [name] MRN: [id number] DOB: August 09, 1966 Sex: male  REASON FOR VISIT: post-op  HPI: Jordan [name] is a 51 y.o. male who presents for postoperative follow-up status post banding of his left basilic vein transposition. This was performed for steal syndrome. The patient denies any further numbness or pain to his left hand. His original basilic vein transposition was on 08/07/2016 by Dr. Scot Dock. He is currently dialyzing via a right IJ tunneled dialysis catheter on Mondays, Wednesdays and Fridays. There are no complications with this.  Current Outpatient Prescriptions  Medication Sig Dispense Refill  . amLODipine (NORVASC) 10 MG tablet Take 1 tablet (10 mg total) by mouth daily. (Patient not taking: Reported on 09/24/2016) 30 tablet 0  . carvedilol (COREG) 6.25 MG tablet Take 1 tablet (6.25 mg total) by mouth 2 (two) times daily with a meal. (Patient not taking: Reported on 09/24/2016) 60 tablet 0  . ibuprofen (ADVIL,MOTRIN) 200 MG tablet Take 400 mg by mouth every 6 (six) hours as needed for headache or moderate pain.    Marland Kitchen oxyCODONE-acetaminophen (PERCOCET/ROXICET) 5-325 MG tablet Take 1 tablet by mouth every 6 (six) hours as needed. (Patient not taking: Reported on 09/24/2016) 6 tablet 0   No current facility-administered medications for this visit.     REVIEW OF SYSTEMS:  [X]  denotes positive finding, [ ]  denotes negative finding Cardiac  Comments:  Chest pain or chest pressure:    Shortness of breath upon exertion:    Short of breath when lying flat:    Irregular heart rhythm:    Constitutional    Fever or chills:      PHYSICAL EXAM: Vitals:   09/24/16 1543  BP: (!) 90/57  Pulse: (!) 104  Resp: 16  Temp: 98.1 F (36.7 C)  TempSrc: Oral  SpO2: 98%  Weight: 151 lb (68.5 kg)  Height: 5\' 4"  (1.626 m)    GENERAL: The patient is a well-nourished male, in no acute distress. The vital signs are documented above. VASCULAR: Left antecubital incision healed.  Easily palpable thrill left upper arm fistula. 1+ left radial pulse. Sensation and motor function intact to left hand.   MEDICAL ISSUES: Status post banding left basilic vein transposition  The patient's left hand steal symptoms have resolved. He is currently dialyzing via a right IJ tunneled dialysis catheter. His original basilic vein transposition surgery was on 08/07/2016. It is maturing well and may be cannulated 3 months from the original surgery date. He will follow-up as needed.  Virgina Jock, PA-C [facility] MD: Scot Dock

## 2016-10-13 DIAGNOSIS — Z0279 Encounter for issue of other medical certificate: Secondary | ICD-10-CM | POA: Diagnosis not present

## 2016-10-17 DIAGNOSIS — N390 Urinary tract infection, site not specified: Secondary | ICD-10-CM | POA: Insufficient documentation

## 2016-11-14 ENCOUNTER — Other Ambulatory Visit: Payer: Self-pay

## 2016-11-17 ENCOUNTER — Other Ambulatory Visit: Payer: Self-pay

## 2016-11-19 ENCOUNTER — Other Ambulatory Visit: Payer: Self-pay

## 2016-11-20 ENCOUNTER — Ambulatory Visit (HOSPITAL_COMMUNITY)
Admission: RE | Admit: 2016-11-20 | Discharge: 2016-11-20 | Disposition: A | Discharge status: Home / Self Care | Payer: Medicaid Other | Source: Ambulatory Visit | Attending: Vascular Surgery | Admitting: Vascular Surgery

## 2016-11-20 DIAGNOSIS — T82898A Other specified complication of vascular prosthetic devices, implants and grafts, initial encounter: Secondary | ICD-10-CM | POA: Diagnosis not present

## 2016-11-20 DIAGNOSIS — Z841 Family history of disorders of kidney and ureter: Secondary | ICD-10-CM | POA: Insufficient documentation

## 2016-11-20 DIAGNOSIS — I120 Hypertensive chronic kidney disease with stage 5 chronic kidney disease or end stage renal disease: Secondary | ICD-10-CM | POA: Insufficient documentation

## 2016-11-20 DIAGNOSIS — F17210 Nicotine dependence, cigarettes, uncomplicated: Secondary | ICD-10-CM | POA: Diagnosis not present

## 2016-11-20 DIAGNOSIS — Z992 Dependence on renal dialysis: Secondary | ICD-10-CM | POA: Insufficient documentation

## 2016-11-20 DIAGNOSIS — Z79899 Other long term (current) drug therapy: Secondary | ICD-10-CM | POA: Insufficient documentation

## 2016-11-20 DIAGNOSIS — N186 End stage renal disease: Secondary | ICD-10-CM | POA: Diagnosis present

## 2016-11-20 DIAGNOSIS — Z833 Family history of diabetes mellitus: Secondary | ICD-10-CM | POA: Diagnosis not present

## 2016-11-20 MED ORDER — LIDOCAINE HCL (PF) 2 % IJ SOLN
INTRAMUSCULAR | Status: AC
  Filled 2016-11-20: qty 10

## 2016-11-20 NOTE — Progress Notes (Signed)
Right neck dressing CDI and VSS; PT DC home with transportation.

## 2016-11-20 NOTE — Progress Notes (Signed)
[facility]IN [facility]ISTS SHORT STAY H&P  CC: ESRD   HPI: Jordan [name] is a 51 y.o. male who has been on HD through  functioning Hemodialysis access in the basilic vein transposition surgery was on 08/07/2016 left extremity. They are here for HD catheter removal. Pt. denies signs of steal syndrome.  Past Medical History:  Diagnosis Date  . Anemia   . CKD (chronic kidney disease)    Stage 5  Dialysis - M/W/F  . ETOH abuse   . Hypertension     Family Hx Family History  Problem Relation Age of Onset  . Diabetes Mother   . Kidney failure Mother   . Kidney failure Brother     Social HX Social History  Substance Use Topics  . Smoking status: Current Every Day Smoker    Packs/day: 0.05    Years: 15.00    Types: Cigarettes  . Smokeless tobacco: Never Used     Comment: down to 1 a day  . Alcohol use 12.6 oz/week    21 Cans of beer per week     Comment: none since 08/04/16    Allergies No Known Allergies  Medications Current Outpatient Prescriptions  Medication Sig Dispense Refill  . amLODipine (NORVASC) 10 MG tablet Take 1 tablet (10 mg total) by mouth daily. (Patient not taking: Reported on 09/24/2016) 30 tablet 0  . carvedilol (COREG) 6.25 MG tablet Take 1 tablet (6.25 mg total) by mouth 2 (two) times daily with a meal. (Patient not taking: Reported on 09/24/2016) 60 tablet 0  . ibuprofen (ADVIL,MOTRIN) 200 MG tablet Take 400 mg by mouth every 6 (six) hours as needed for headache or moderate pain.    Marland Kitchen oxyCODONE-acetaminophen (PERCOCET/ROXICET) 5-325 MG tablet Take 1 tablet by mouth every 6 (six) hours as needed. (Patient not taking: Reported on 09/24/2016) 6 tablet 0   No current facility-administered medications for this encounter.     Labs COAG No results found for: INR, PROTIME No results found for: PTT  PHYSICAL EXAM  There were no vitals filed for this visit.  General:  WDWN in NAD HENT: WNL Eyes: Pupils equal Pulmonary: normal non-labored breathing   Cardiac: RRR, Skin: normal, no cyanosis, jaundice, pallor or bruising Vascular Exam/Pulses: 2+  pulses in LEFT lower extremity. Extremities without ischemic changes, no Gangrene , no cellulitis; no open wounds;   There is a good thrill and good bruit in the left AV fistula . Hand grip is 5/5 and sensation in digits is intact;   Impression: This is a 51 y.o. male who has a functioning HD access.  Plan: Removal of Right IJ HD catheter Theda Sers, Laurynn Mccorvey [facility] 11/20/2016 11:58 AM  [facility] Catheter Removal Procedure Note  Diagnosis: ESRD with Functioning AVF/AVGG  Plan:  Remove right diatek catheter  Consent signed:  yes Time out completed:  yes Coumadin:  No. PT/INR (if applicable):   Other labs:   Procedure: 1.  Sterile prepping and draping over catheter area 2. 5 ml 2% lidocaine plain instilled at removal site. 3.  right catheter removed in its entirety with cuff in tact. 4.  Complications: none  5. Tip of catheter sent for culture:  no   Patient tolerated procedure well:  yes Pressure held, no bleeding noted, dressing applied Instructions given to the pt regarding wound care and bleeding.  OtherLaurence Slate [facility] 11/20/2016 11:58 AM

## 2016-11-21 NOTE — Addendum Note (Signed)
Addendum  created 11/21/16 1310 by Donnella Bi, RN   Order sets accessed

## 2016-12-10 ENCOUNTER — Telehealth: Payer: Self-pay

## 2016-12-10 DIAGNOSIS — T82510A Breakdown (mechanical) of surgically created arteriovenous fistula, initial encounter: Secondary | ICD-10-CM

## 2016-12-10 DIAGNOSIS — N186 End stage renal disease: Secondary | ICD-10-CM

## 2016-12-10 NOTE — Telephone Encounter (Signed)
rec'd call from nurse at [place].  Reported the pt's left arm access has not been running well.  Reported, today, he had to be stuck in the arterial limb 3 x,  to complete the HD treatment.  Stated it seemed like the access was  clotting off.  Reported they were able to finish the treatment today.  Discussed with Dr. Scot Dock.  Recommended to schedule for access duplex and office visit to further evaluate.

## 2016-12-11 NOTE — Telephone Encounter (Signed)
Sched appt 12/16/16; lab at 11:30 and TFE 1:00. Lm on cell# to inform pt.

## 2016-12-16 ENCOUNTER — Inpatient Hospital Stay (HOSPITAL_COMMUNITY): Admission: RE | Admit: 2016-12-16 | Payer: PRIVATE HEALTH INSURANCE | Source: Ambulatory Visit

## 2016-12-16 ENCOUNTER — Ambulatory Visit: Payer: PRIVATE HEALTH INSURANCE | Admitting: Vascular Surgery

## 2017-01-28 ENCOUNTER — Other Ambulatory Visit: Payer: Self-pay

## 2017-02-26 ENCOUNTER — Ambulatory Visit (INDEPENDENT_AMBULATORY_CARE_PROVIDER_SITE_OTHER): Payer: Medicare Other | Admitting: Urology

## 2017-02-26 ENCOUNTER — Encounter: Payer: Self-pay | Admitting: Urology

## 2017-02-26 VITALS — BP 112/67 | HR 98 | Ht 64.0 in | Wt 151.8 lb

## 2017-02-26 DIAGNOSIS — N529 Male erectile dysfunction, unspecified: Secondary | ICD-10-CM

## 2017-02-26 MED ORDER — SILDENAFIL CITRATE 20 MG PO TABS: ORAL_TABLET | ORAL | 3 refills | Status: DC

## 2017-02-26 NOTE — Progress Notes (Signed)
02/26/2017 3:48 PM   Jordan [name] Jul 23, 1966 [id number]  Referring provider: Corliss Parish, MD [address] [place], [place] [address]  Chief Complaint  Patient presents with  . Erectile Dysfunction    HPI: The patient is a 51 year old [name] with a past medical history of end-stage renal disease on hemodialysis presents to discuss erectile dysfunction.  This is gotten worse since he started dialysis November 2017. He is able to obtain a partial erection but does not penetrate well enough for intercourse. He has not tried medications before. He has no heart conditions. He can walk up and down flights of stairs without difficulty. She does not take nitrates for chest pain.   PMH: Past Medical History:  Diagnosis Date  . Anemia   . CKD (chronic kidney disease)    Stage 5  Dialysis - M/W/F  . ETOH abuse   . Hypertension     Surgical History: Past Surgical History:  Procedure Laterality Date  . BASCILIC VEIN TRANSPOSITION Left 08/07/2016   Procedure: LEFT BASILIC VEIN TRANSPOSITION;  Surgeon: Angelia Mould, MD;  Location: [place];  Service: Vascular;  Laterality: Left;  . COLONOSCOPY N/A 08/12/2016   Procedure: COLONOSCOPY;  Surgeon: Otis Brace, MD;  Location: [place];  Service: Gastroenterology;  Laterality: N/A;  . ESOPHAGOGASTRODUODENOSCOPY N/A 08/12/2016   Procedure: ESOPHAGOGASTRODUODENOSCOPY (EGD);  Surgeon: Otis Brace, MD;  Location: [place];  Service: Gastroenterology;  Laterality: N/A;  . INSERTION OF DIALYSIS CATHETER N/A 08/07/2016   Procedure: INSERTION OF TUNNELED DIALYSIS CATHETER;  Surgeon: Angelia Mould, MD;  Location: [place];  Service: Vascular;  Laterality: N/A;  . LIGATION OF ARTERIOVENOUS  FISTULA Left 09/12/2016   Procedure: BANDING OF LEFT  ARTERIOVENOUS  FISTULA;  Surgeon: Angelia Mould, MD;  Location: [place];  Service: Vascular;  Laterality: Left;  . NO PAST SURGERIES      Home Medications:  Allergies  as of 02/26/2017   No Known Allergies     Medication List       Accurate as of 02/26/17  3:48 PM. Always use your most recent med list.          amLODipine 10 MG tablet Commonly known as:  NORVASC Take 1 tablet (10 mg total) by mouth daily.   carvedilol 6.25 MG tablet Commonly known as:  COREG Take 1 tablet (6.25 mg total) by mouth 2 (two) times daily with a meal.   ibuprofen 200 MG tablet Commonly known as:  ADVIL,MOTRIN Take 400 mg by mouth every 6 (six) hours as needed for headache or moderate pain.   oxyCODONE-acetaminophen 5-325 MG tablet Commonly known as:  PERCOCET/ROXICET Take 1 tablet by mouth every 6 (six) hours as needed.   sildenafil 20 MG tablet Commonly known as:  REVATIO Take 1 to 5 tabs PO daily prn       Allergies: No Known Allergies  Family History: Family History  Problem Relation Age of Onset  . Diabetes Mother   . Kidney failure Mother   . Kidney failure Brother   . Post-traumatic stress disorder Neg Hx   . Bladder Cancer Neg Hx   . Kidney cancer Neg Hx     Social History:  reports that he has been smoking Cigarettes.  He has a 0.75 pack-year smoking history. He has never used smokeless tobacco. He reports that he drinks about 12.6 oz of alcohol per week . He reports that he does not use drugs.  ROS: UROLOGY Frequent Urination?: No Hard to postpone urination?:  No Burning/pain with urination?: No Get up at night to urinate?: No Leakage of urine?: No Urine stream starts and stops?: No Trouble starting stream?: No Do you have to strain to urinate?: No Blood in urine?: No Urinary tract infection?: No Sexually transmitted disease?: No Injury to kidneys or bladder?: No Painful intercourse?: No Weak stream?: No Erection problems?: No Penile pain?: No  Gastrointestinal Nausea?: No Vomiting?: No Indigestion/heartburn?: No Diarrhea?: No Constipation?: No  Constitutional Fever: No Night sweats?: No Weight loss?: No Fatigue?:  No  Skin Skin rash/lesions?: Yes Itching?: Yes  Eyes Blurred vision?: No Double vision?: No  Ears/Nose/Throat Sore throat?: No Sinus problems?: No  Hematologic/Lymphatic Swollen glands?: No Easy bruising?: No  Cardiovascular Leg swelling?: No Chest pain?: No  Respiratory Cough?: No Shortness of breath?: No  Endocrine Excessive thirst?: No  Musculoskeletal Back pain?: No Joint pain?: No  Neurological Headaches?: No Dizziness?: No  Psychologic Depression?: No Anxiety?: No  Physical Exam: BP 112/67 (BP Location: Left Arm, Patient Position: Sitting, Cuff Size: Normal)   Pulse 98   Ht 5\' 4"  (1.626 m)   Wt 151 lb 12.8 oz (68.9 kg)   BMI 26.06 kg/m   Constitutional:  Alert and oriented, No acute distress. HEENT: [place] AT, moist mucus membranes.  Trachea midline, no masses. Cardiovascular: No clubbing, cyanosis, or edema. Respiratory: Normal respiratory effort, no increased work of breathing. GI: Abdomen is soft, nontender, nondistended, no abdominal masses GU: No CVA tenderness. Normal phallus. Testicles descended equal bilaterally. Benign. Skin: No rashes, bruises or suspicious lesions. Lymph: No cervical or inguinal adenopathy. Neurologic: Grossly intact, no focal deficits, moving all 4 extremities. Psychiatric: Normal mood and affect.  Laboratory Data: Lab Results  Component Value Date   WBC 6.9 09/17/2016   HGB 11.4 (L) 09/17/2016   HCT 33.7 (L) 09/17/2016   MCV 96.7 09/17/2016   PLT 157 09/17/2016    Lab Results  Component Value Date   CREATININE 9.86 (H) 09/17/2016    No results found for: PSA  No results found for: TESTOSTERONE  Lab Results  Component Value Date   HGBA1C 5.3 08/04/2016    Urinalysis    Component Value Date/Time   COLORURINE STRAW (A) 08/04/2016 0448   APPEARANCEUR CLOUDY (A) 08/04/2016 0448   LABSPEC 1.011 08/04/2016 0448   PHURINE 5.5 08/04/2016 0448   GLUCOSEU 100 (A) 08/04/2016 0448   HGBUR SMALL (A)  08/04/2016 0448   BILIRUBINUR NEGATIVE 08/04/2016 0448   KETONESUR NEGATIVE 08/04/2016 0448   PROTEINUR >300 (A) 08/04/2016 0448   NITRITE NEGATIVE 08/04/2016 0448   LEUKOCYTESUR NEGATIVE 08/04/2016 0448    Assessment & Plan:   1. Erectile dysfunction We'll start the patient on generic sildenafil 1-5 tablets by mouth daily when necessary. He is one of the risk of priapism and the need for emergent intervention. He'll follow-up in 3 months to assess his progress.  Return in about 3 months (around 05/29/2017).  Nickie Retort, MD  [facility] [address] [place], [place] [address] [phone]

## 2017-05-26 DIAGNOSIS — K2971 Gastritis, unspecified, with bleeding: Secondary | ICD-10-CM | POA: Insufficient documentation

## 2017-05-26 DIAGNOSIS — I10 Essential (primary) hypertension: Secondary | ICD-10-CM | POA: Diagnosis present

## 2017-05-26 DIAGNOSIS — Z992 Dependence on renal dialysis: Secondary | ICD-10-CM

## 2017-05-26 DIAGNOSIS — N186 End stage renal disease: Secondary | ICD-10-CM | POA: Diagnosis present

## 2017-05-28 ENCOUNTER — Encounter: Payer: Self-pay | Admitting: Urology

## 2017-05-28 ENCOUNTER — Ambulatory Visit: Payer: Medicaid Other | Admitting: Urology

## 2017-06-18 ENCOUNTER — Encounter: Payer: Self-pay | Admitting: Urology

## 2017-06-18 ENCOUNTER — Ambulatory Visit: Payer: Medicaid Other | Admitting: Urology

## 2017-10-26 ENCOUNTER — Other Ambulatory Visit: Payer: Self-pay

## 2017-10-26 DIAGNOSIS — R200 Anesthesia of skin: Secondary | ICD-10-CM

## 2017-11-17 ENCOUNTER — Other Ambulatory Visit (HOSPITAL_COMMUNITY): Payer: Self-pay

## 2017-11-17 ENCOUNTER — Ambulatory Visit: Payer: Self-pay | Admitting: Vascular Surgery

## 2018-01-12 ENCOUNTER — Inpatient Hospital Stay (HOSPITAL_COMMUNITY): Admission: RE | Admit: 2018-01-12 | Payer: Self-pay | Source: Ambulatory Visit

## 2018-01-12 ENCOUNTER — Ambulatory Visit: Payer: Self-pay | Admitting: Vascular Surgery

## 2018-03-19 ENCOUNTER — Emergency Department (HOSPITAL_COMMUNITY): Payer: Medicare Other

## 2018-03-19 ENCOUNTER — Encounter (HOSPITAL_COMMUNITY): Payer: Self-pay | Admitting: Internal Medicine

## 2018-03-19 ENCOUNTER — Inpatient Hospital Stay (HOSPITAL_COMMUNITY)
Admission: EM | Admit: 2018-03-19 | Discharge: 2018-03-19 | DRG: 070 | Discharge status: Against Medical Advice | Payer: Medicare Other | Attending: Internal Medicine | Admitting: Internal Medicine

## 2018-03-19 DIAGNOSIS — F17210 Nicotine dependence, cigarettes, uncomplicated: Secondary | ICD-10-CM | POA: Diagnosis present

## 2018-03-19 DIAGNOSIS — Z862 Personal history of diseases of the blood and blood-forming organs and certain disorders involving the immune mechanism: Secondary | ICD-10-CM | POA: Insufficient documentation

## 2018-03-19 DIAGNOSIS — K148 Other diseases of tongue: Secondary | ICD-10-CM

## 2018-03-19 DIAGNOSIS — X58XXXA Exposure to other specified factors, initial encounter: Secondary | ICD-10-CM | POA: Diagnosis present

## 2018-03-19 DIAGNOSIS — F1010 Alcohol abuse, uncomplicated: Secondary | ICD-10-CM | POA: Diagnosis present

## 2018-03-19 DIAGNOSIS — Y900 Blood alcohol level of less than 20 mg/100 ml: Secondary | ICD-10-CM | POA: Diagnosis present

## 2018-03-19 DIAGNOSIS — J029 Acute pharyngitis, unspecified: Secondary | ICD-10-CM | POA: Diagnosis present

## 2018-03-19 DIAGNOSIS — I120 Hypertensive chronic kidney disease with stage 5 chronic kidney disease or end stage renal disease: Secondary | ICD-10-CM | POA: Diagnosis present

## 2018-03-19 DIAGNOSIS — Z992 Dependence on renal dialysis: Secondary | ICD-10-CM

## 2018-03-19 DIAGNOSIS — T68XXXA Hypothermia, initial encounter: Secondary | ICD-10-CM | POA: Diagnosis present

## 2018-03-19 DIAGNOSIS — E872 Acidosis: Secondary | ICD-10-CM | POA: Diagnosis present

## 2018-03-19 DIAGNOSIS — Z8619 Personal history of other infectious and parasitic diseases: Secondary | ICD-10-CM | POA: Insufficient documentation

## 2018-03-19 DIAGNOSIS — B1910 Unspecified viral hepatitis B without hepatic coma: Secondary | ICD-10-CM | POA: Insufficient documentation

## 2018-03-19 DIAGNOSIS — N2581 Secondary hyperparathyroidism of renal origin: Secondary | ICD-10-CM | POA: Diagnosis present

## 2018-03-19 DIAGNOSIS — Z79899 Other long term (current) drug therapy: Secondary | ICD-10-CM | POA: Diagnosis not present

## 2018-03-19 DIAGNOSIS — E162 Hypoglycemia, unspecified: Secondary | ICD-10-CM | POA: Diagnosis present

## 2018-03-19 DIAGNOSIS — I10 Essential (primary) hypertension: Secondary | ICD-10-CM | POA: Diagnosis present

## 2018-03-19 DIAGNOSIS — R402413 Glasgow coma scale score 13-15, at hospital admission: Secondary | ICD-10-CM | POA: Diagnosis present

## 2018-03-19 DIAGNOSIS — Z841 Family history of disorders of kidney and ureter: Secondary | ICD-10-CM

## 2018-03-19 DIAGNOSIS — Z5321 Procedure and treatment not carried out due to patient leaving prior to being seen by health care provider: Secondary | ICD-10-CM | POA: Diagnosis not present

## 2018-03-19 DIAGNOSIS — N186 End stage renal disease: Secondary | ICD-10-CM | POA: Diagnosis present

## 2018-03-19 DIAGNOSIS — K7290 Hepatic failure, unspecified without coma: Secondary | ICD-10-CM

## 2018-03-19 DIAGNOSIS — G9340 Encephalopathy, unspecified: Principal | ICD-10-CM | POA: Diagnosis present

## 2018-03-19 DIAGNOSIS — D649 Anemia, unspecified: Secondary | ICD-10-CM

## 2018-03-19 DIAGNOSIS — D631 Anemia in chronic kidney disease: Secondary | ICD-10-CM | POA: Diagnosis present

## 2018-03-19 DIAGNOSIS — K7682 Hepatic encephalopathy: Secondary | ICD-10-CM

## 2018-03-19 LAB — CBC WITH DIFFERENTIAL/PLATELET
Abs Immature Granulocytes: 0.1 10*3/uL (ref 0.0–0.1)
Basophils Absolute: 0 10*3/uL (ref 0.0–0.1)
Basophils Relative: 1 %
Eosinophils Absolute: 0.7 10*3/uL (ref 0.0–0.7)
Eosinophils Relative: 8 %
HCT: 35 % — ABNORMAL LOW (ref 39.0–52.0)
Hemoglobin: 11.2 g/dL — ABNORMAL LOW (ref 13.0–17.0)
Immature Granulocytes: 1 %
Lymphocytes Relative: 25 %
Lymphs Abs: 1.9 10*3/uL (ref 0.7–4.0)
MCH: 32.6 pg (ref 26.0–34.0)
MCHC: 32 g/dL (ref 30.0–36.0)
MCV: 101.7 fL — ABNORMAL HIGH (ref 78.0–100.0)
Monocytes Absolute: 0.8 10*3/uL (ref 0.1–1.0)
Monocytes Relative: 10 %
Neutro Abs: 4.5 10*3/uL (ref 1.7–7.7)
Neutrophils Relative %: 56 %
Platelets: 201 10*3/uL (ref 150–400)
RBC: 3.44 MIL/uL — ABNORMAL LOW (ref 4.22–5.81)
RDW: 14.2 % (ref 11.5–15.5)
WBC: 7.9 10*3/uL (ref 4.0–10.5)

## 2018-03-19 LAB — CBG MONITORING, ED: Glucose-Capillary: 86 mg/dL (ref 70–99)

## 2018-03-19 LAB — HEMOGLOBIN A1C
Hgb A1c MFr Bld: 5.5 % (ref 4.8–5.6)
Mean Plasma Glucose: 111.15 mg/dL

## 2018-03-19 LAB — I-STAT TROPONIN, ED: Troponin i, poc: 0 ng/mL (ref 0.00–0.08)

## 2018-03-19 LAB — COMPREHENSIVE METABOLIC PANEL
ALT: 18 U/L (ref 0–44)
AST: 22 U/L (ref 15–41)
Albumin: 3.7 g/dL (ref 3.5–5.0)
Alkaline Phosphatase: 81 U/L (ref 38–126)
Anion gap: 23 — ABNORMAL HIGH (ref 5–15)
BUN: 34 mg/dL — ABNORMAL HIGH (ref 6–20)
CO2: 20 mmol/L — ABNORMAL LOW (ref 22–32)
Calcium: 8.1 mg/dL — ABNORMAL LOW (ref 8.9–10.3)
Chloride: 96 mmol/L — ABNORMAL LOW (ref 98–111)
Creatinine, Ser: 12.99 mg/dL — ABNORMAL HIGH (ref 0.61–1.24)
GFR calc Af Amer: 4 mL/min — ABNORMAL LOW (ref >60)
GFR calc non Af Amer: 4 mL/min — ABNORMAL LOW (ref >60)
Glucose, Bld: 71 mg/dL (ref 70–99)
Potassium: 3.9 mmol/L (ref 3.5–5.1)
Sodium: 139 mmol/L (ref 135–145)
Total Bilirubin: 0.8 mg/dL (ref 0.3–1.2)
Total Protein: 8.1 g/dL (ref 6.5–8.1)

## 2018-03-19 LAB — I-STAT CG4 LACTIC ACID, ED: Lactic Acid, Venous: 3.17 mmol/L (ref 0.5–1.9)

## 2018-03-19 LAB — I-STAT VENOUS BLOOD GAS, ED
Acid-base deficit: 2 mmol/L (ref 0.0–2.0)
Bicarbonate: 26.2 mmol/L (ref 20.0–28.0)
O2 Saturation: 47 %
TCO2: 28 mmol/L (ref 22–32)
pCO2, Ven: 58.6 mmHg (ref 44.0–60.0)
pH, Ven: 7.258 (ref 7.250–7.430)
pO2, Ven: 30 mmHg — CL (ref 32.0–45.0)

## 2018-03-19 LAB — I-STAT CHEM 8, ED
BUN: 34 mg/dL — ABNORMAL HIGH (ref 6–20)
Calcium, Ion: 0.98 mmol/L — ABNORMAL LOW (ref 1.15–1.40)
Chloride: 96 mmol/L — ABNORMAL LOW (ref 98–111)
Creatinine, Ser: 13.1 mg/dL — ABNORMAL HIGH (ref 0.61–1.24)
Glucose, Bld: 48 mg/dL — ABNORMAL LOW (ref 70–99)
HCT: 33 % — ABNORMAL LOW (ref 39.0–52.0)
Hemoglobin: 11.2 g/dL — ABNORMAL LOW (ref 13.0–17.0)
Potassium: 3.6 mmol/L (ref 3.5–5.1)
Sodium: 139 mmol/L (ref 135–145)
TCO2: 24 mmol/L (ref 22–32)

## 2018-03-19 LAB — OSMOLALITY: Osmolality: 303 mOsm/kg — ABNORMAL HIGH (ref 275–295)

## 2018-03-19 LAB — AMMONIA: Ammonia: 82 umol/L — ABNORMAL HIGH (ref 9–35)

## 2018-03-19 LAB — GLUCOSE, CAPILLARY: Glucose-Capillary: 116 mg/dL — ABNORMAL HIGH (ref 70–99)

## 2018-03-19 LAB — ETHANOL: Alcohol, Ethyl (B): 14 mg/dL — ABNORMAL HIGH (ref <10)

## 2018-03-19 LAB — GROUP A STREP BY PCR: Group A Strep by PCR: NOT DETECTED

## 2018-03-19 LAB — TSH: TSH: 0.453 u[IU]/mL (ref 0.350–4.500)

## 2018-03-19 IMAGING — DX DG CHEST 2V
2 series · 2 of 2 positions shown · non-contrast
Comparison: [DATE]

CLINICAL DATA: Pt here from home after calling out for "throat pain
that was an emergency." Upon EMS arrival, pt was responsive to pain
only. CBG on arrival 57. Recheck CBG [REDACTED] No hx of diabetes

EXAM:
CHEST - 2 VIEW

[w chest lat]
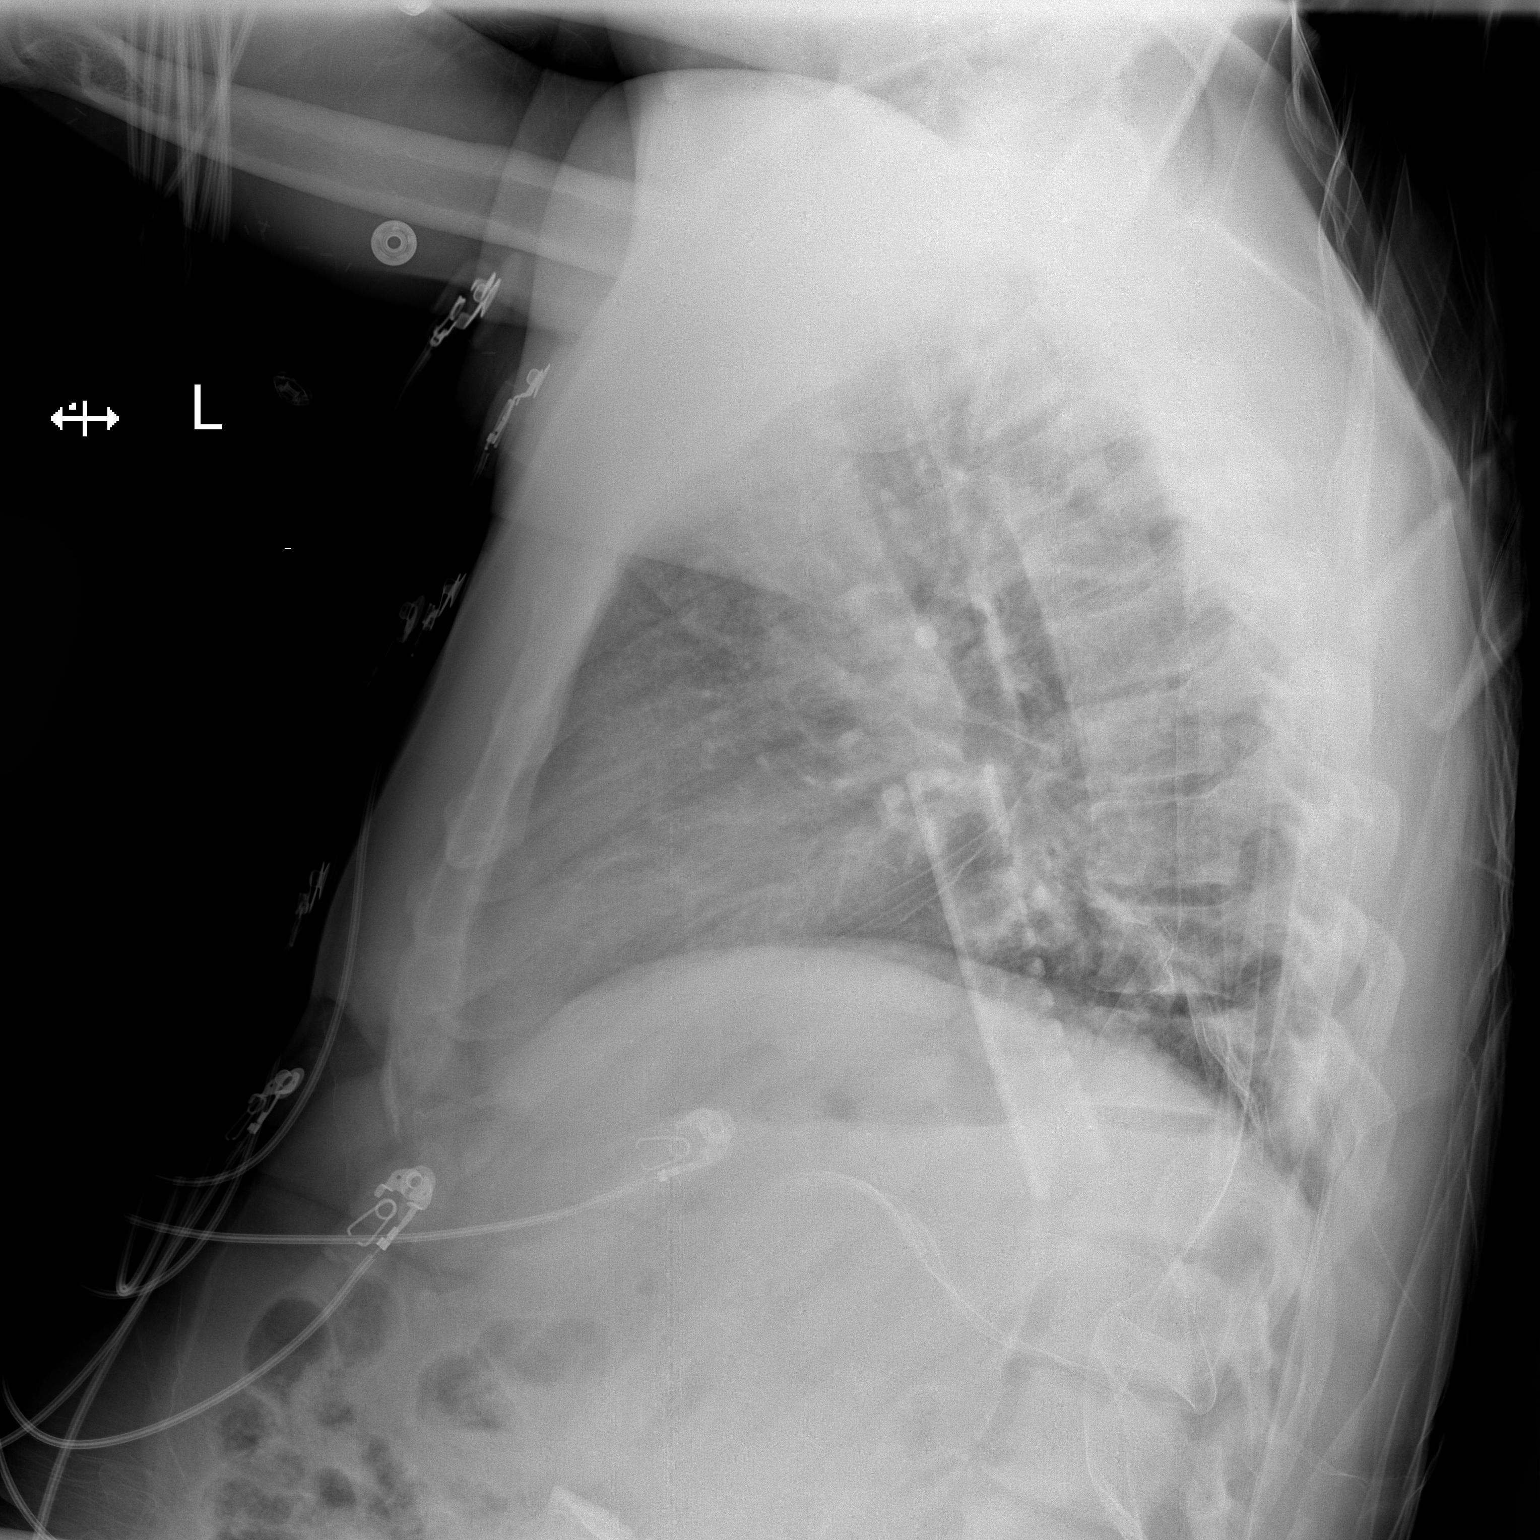

[x chest ap]
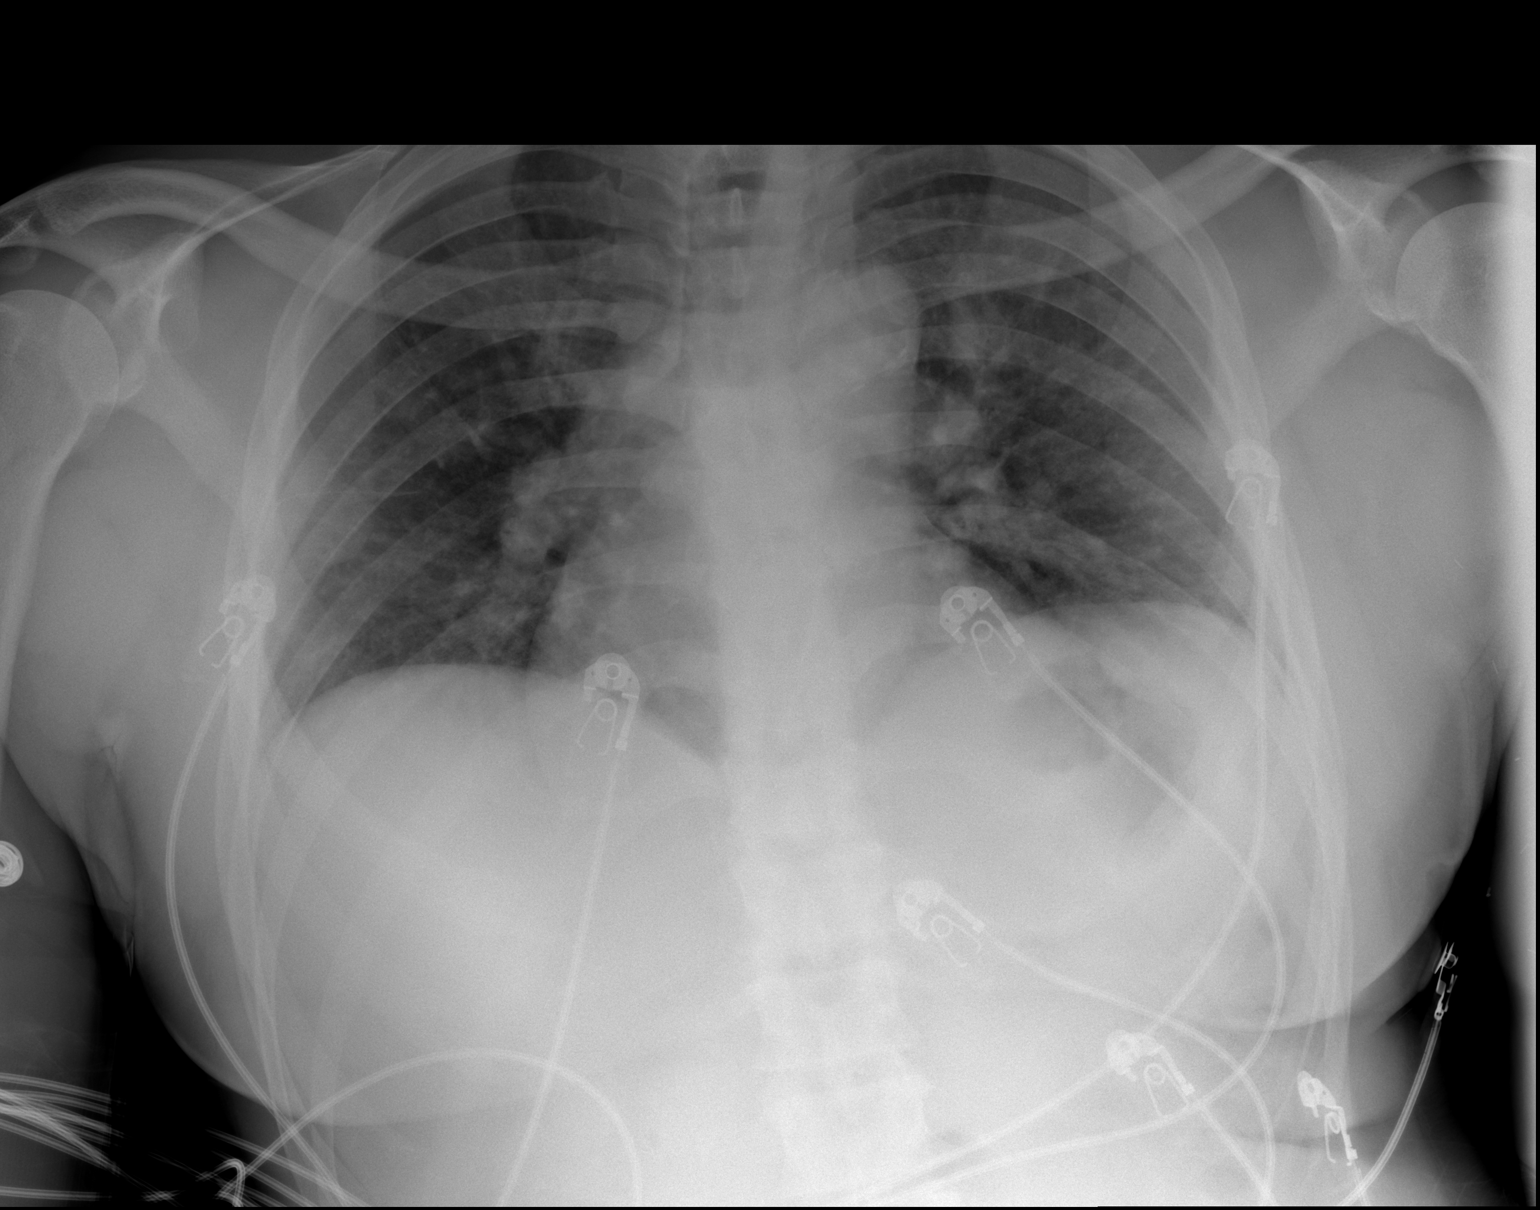

[2 of 2 positions shown; findings below may reference images not displayed]

FINDINGS: Low lung volumes with crowding of bronchovascular structures. No
confluent airspace disease or overt interstitial edema.

Heart size upper limits normal for technique. Aortic Atherosclerosis
([YZ]-170.0).

No effusion. No pneumothorax. Interval removal of tunneled right IJ
hemodialysis catheter.

Visualized bones unremarkable.
IMPRESSION: Low volumes.  No acute cardiopulmonary disease.

## 2018-03-19 IMAGING — CT CT HEAD W/O CM
3 series · 16 of 47 positions shown, 19 images · non-contrast
Comparison: [DATE]

CLINICAL DATA: Acute presentation with throat pain. Dialysis
patient. Mental status changes.

EXAM:
CT HEAD WITHOUT CONTRAST
TECHNIQUE: Contiguous axial images were obtained from the base of the skull
through the vertex without intravenous contrast.

[Series 1: head 5.0 h30s · axial · 0.42mm/px · z∈[-86,+44]mm · 10 of 32 slices shown, 13 images]
[im 3/32  brain]
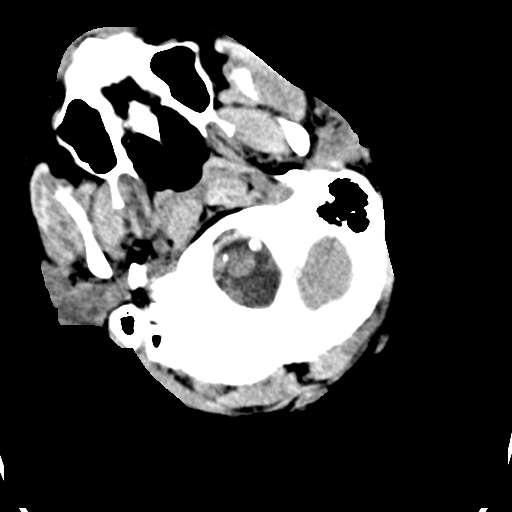
[im 3/32  bone]
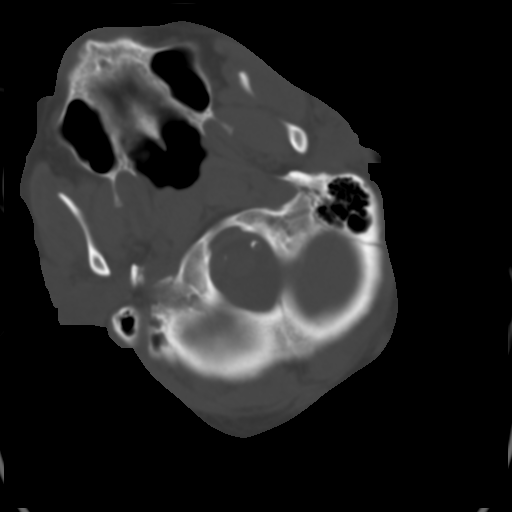
[im 6/32  brain]
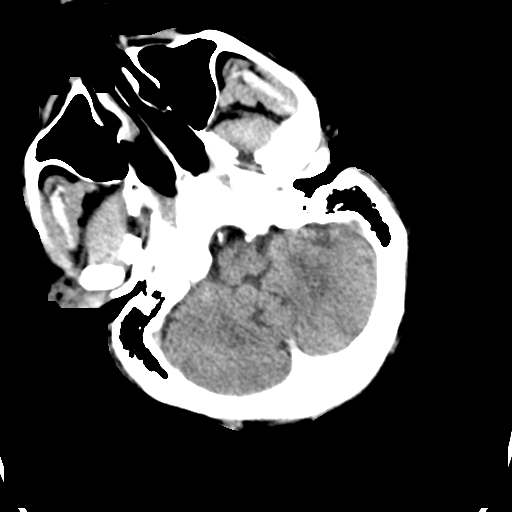
[im 9/32  brain]
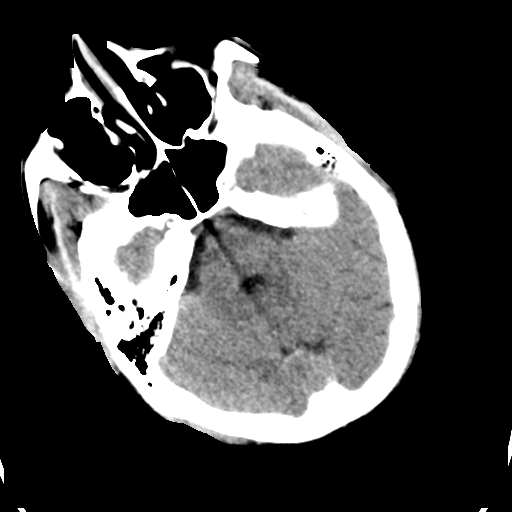
[im 11/32  brain]
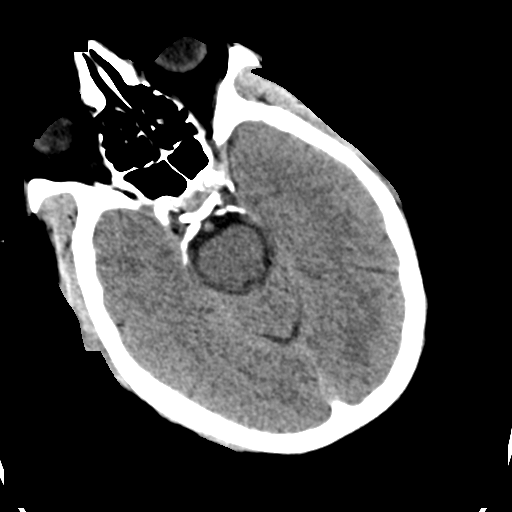
[im 14/32  brain]
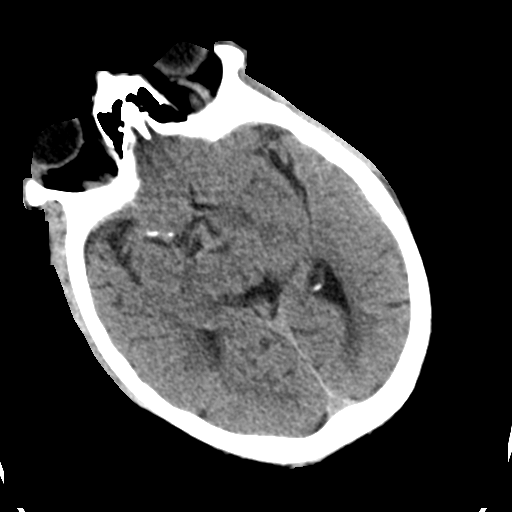
[im 14/32  bone]
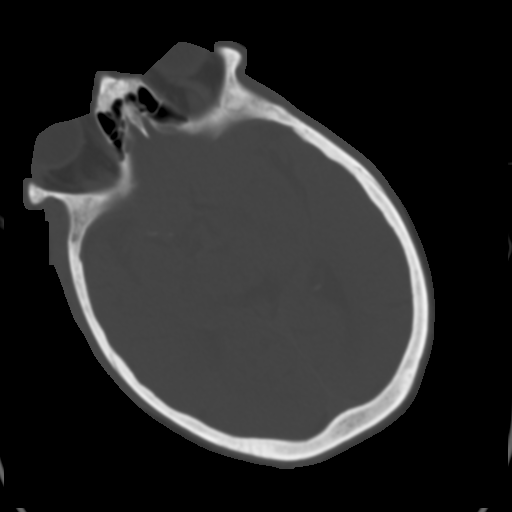
[im 18/32  brain]
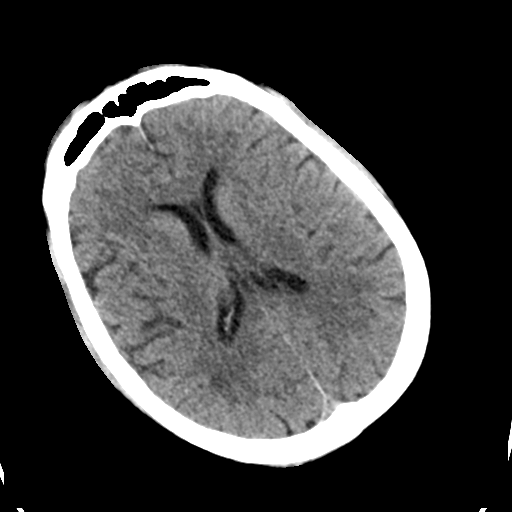
[im 21/32  brain]
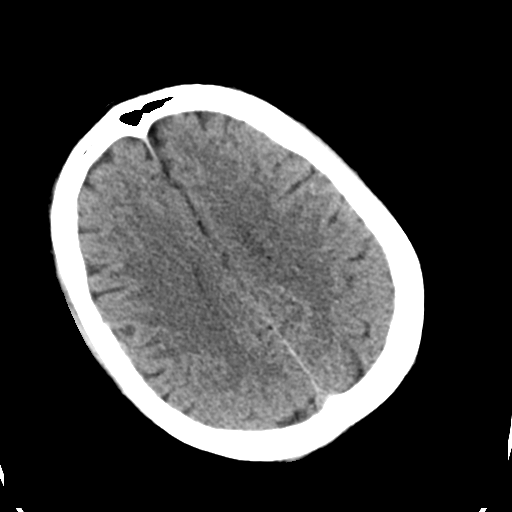
[im 24/32  brain]
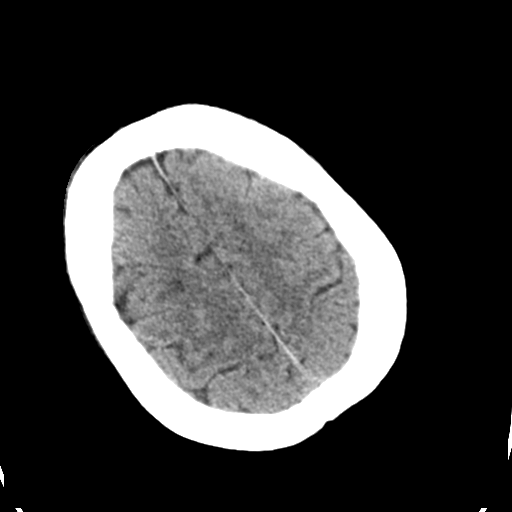
[im 26/32  brain]
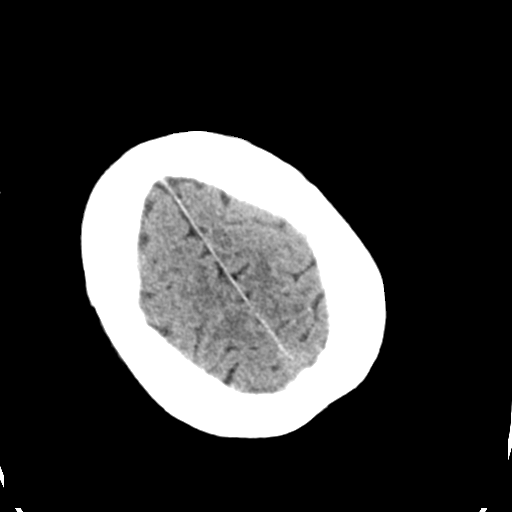
[im 26/32  bone]
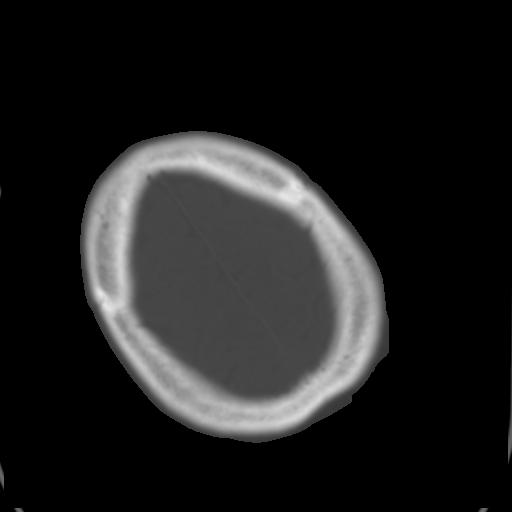
[im 29/32  brain]
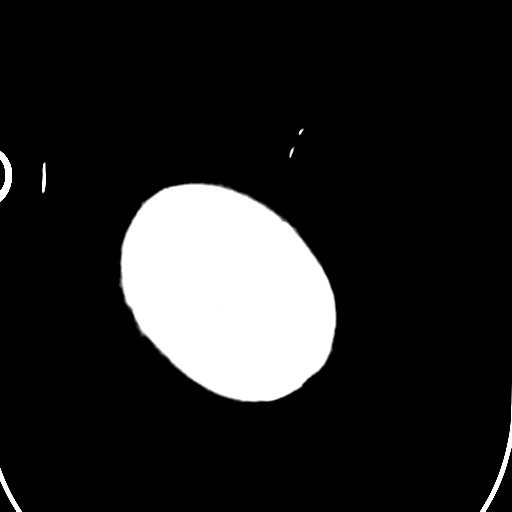

[Series 5: head 3.0 mpr cor · coronal · 0.30mm/px · 3 of 67 slices shown]
[im 23/67  brain]
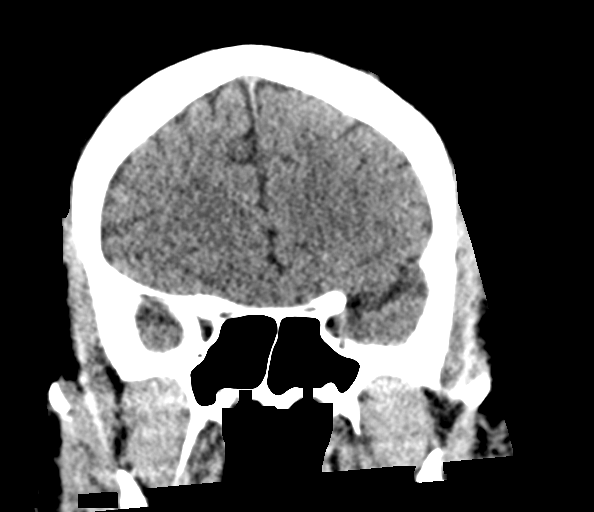
[im 30/67  brain]
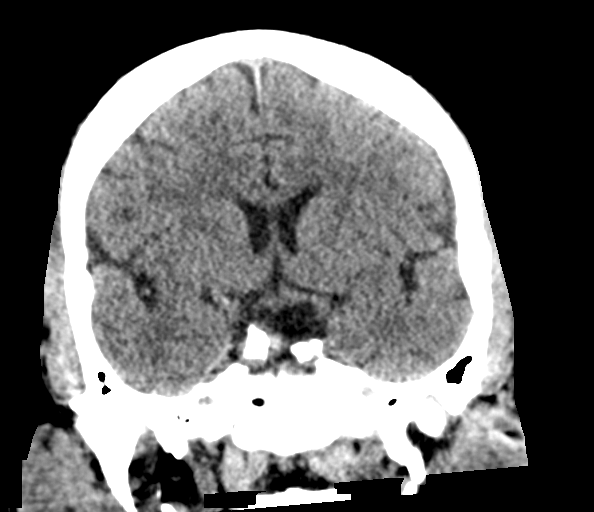
[im 37/67  brain]
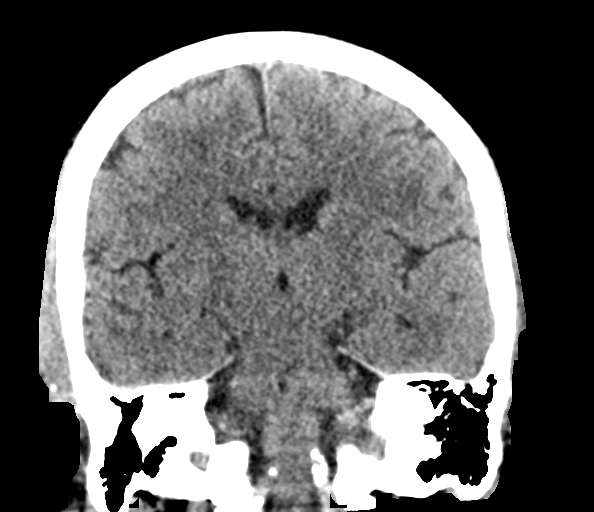

[Series 6: head 3.0 mpr sag · sagittal · 0.36mm/px · 3 of 58 slices shown]
[im 20/58  brain]
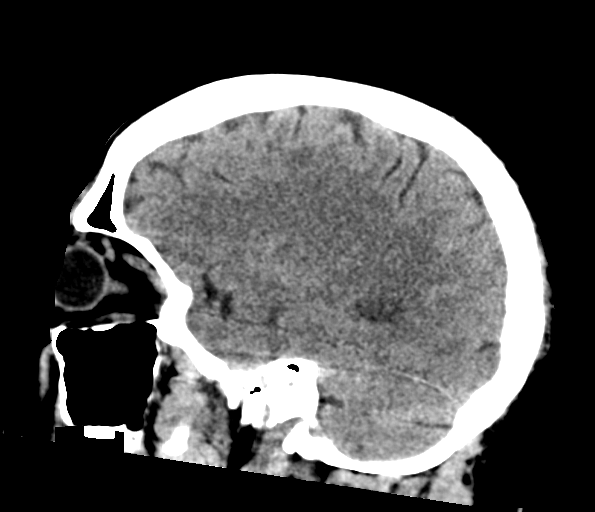
[im 29/58  brain]
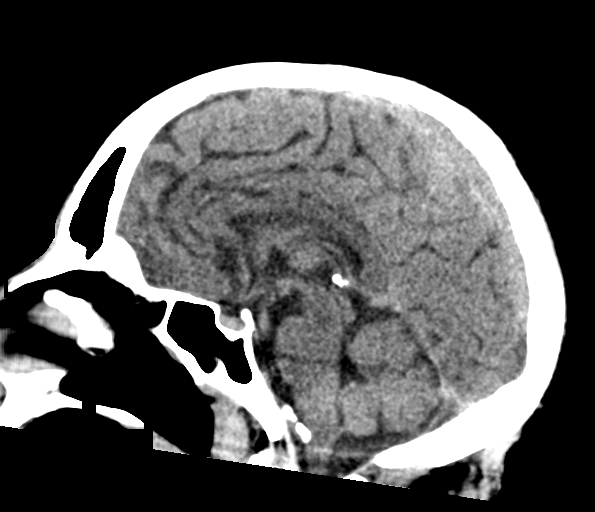
[im 39/58  brain]
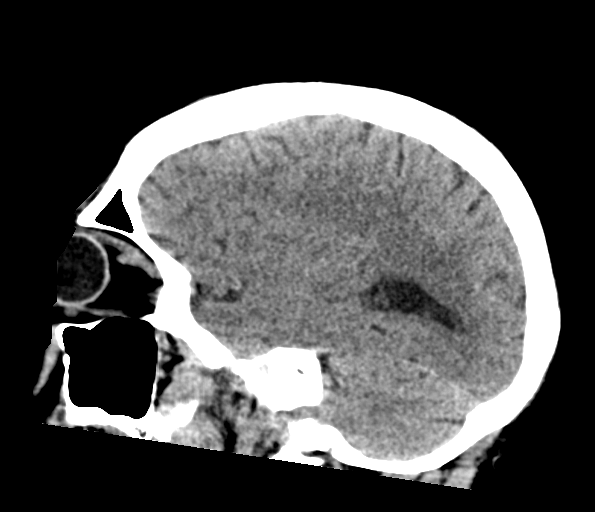

[16 of 47 positions shown; findings below may reference images not displayed]

FINDINGS: Brain: The brain does not show accelerated atrophy. There are mild
chronic small-vessel ischemic changes of the white matter. No sign
of acute infarction, mass lesion, hemorrhage, hydrocephalus or
extra-axial collection.

Vascular: There is atherosclerotic calcification of the major
vessels at the base of the brain.

Skull: Negative

Sinuses/Orbits: Clear/normal

Other: None
IMPRESSION: No acute or significant brain finding. Mild chronic small-vessel
change of the hemispheric white matter.

## 2018-03-19 IMAGING — CT CT NECK W/O CM
5 series · 16 of 33 positions shown, 18 images · IV contrast (APPLIED)
Comparison: [DATE]

CLINICAL DATA: Acute presentation with severe throat pain.

EXAM:
CT NECK WITHOUT CONTRAST
TECHNIQUE: Multidetector CT imaging of the neck was performed following the
standard protocol without intravenous contrast.

[Series 3: neck 2.0 i31s 3 · axial · 0.39mm/px · z∈[-199,-125]mm · 2 of 111 slices shown (1 of 2)]
[im 37/111  bone]
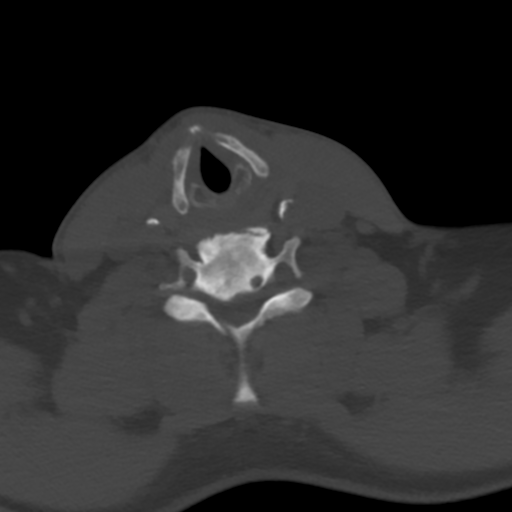
[im 74/111  bone]
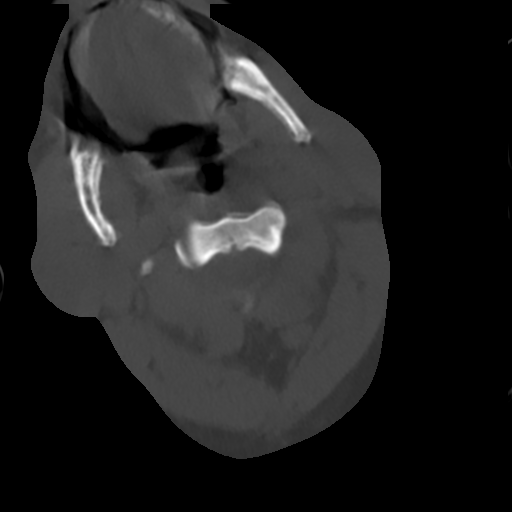

[Series 4: neck 2.0 i31s 3 · axial · 0.39mm/px · z∈[-217,-107]mm · 3 of 111 slices shown, 4 images (2 of 2)]
[im 28/111  soft-tissue]
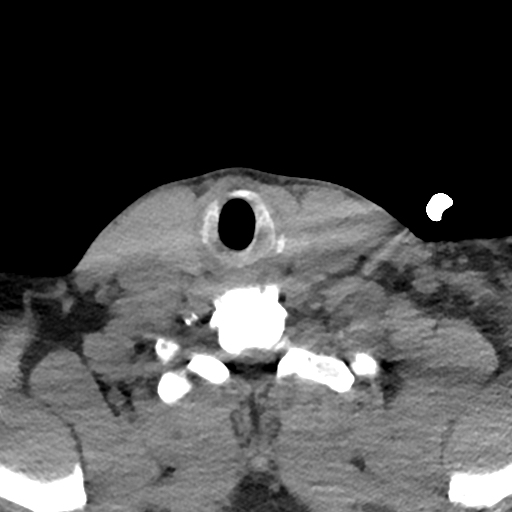
[im 28/111  bone]
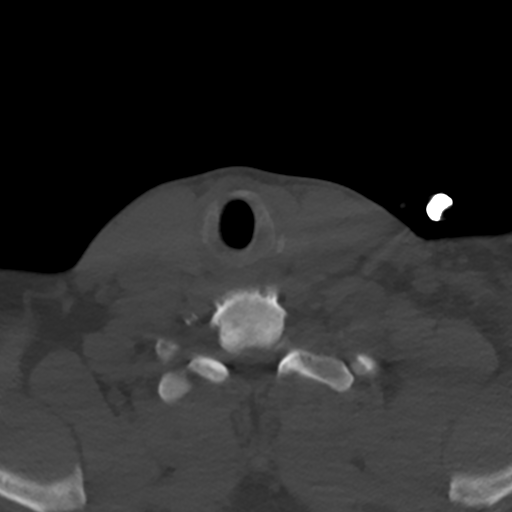
[im 56/111  bone]
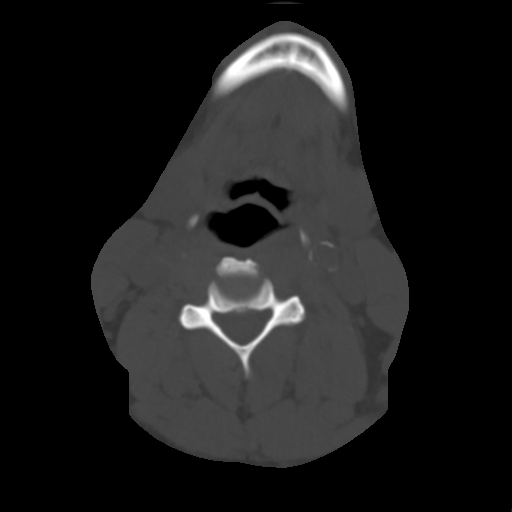
[im 83/111  bone]
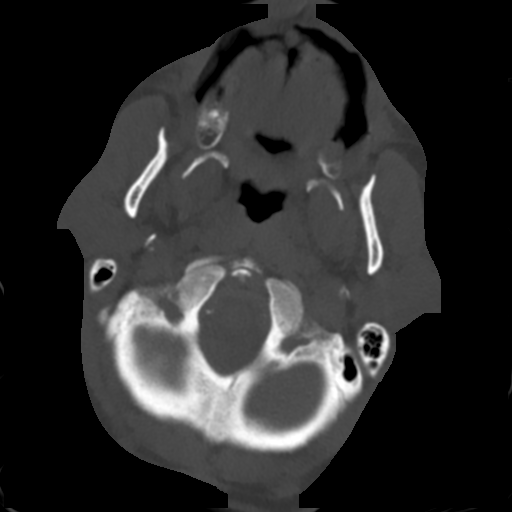

[Series 7: coronal st · coronal · 0.38mm/px · 3 of 110 slices shown]
[im 22/110  bone]
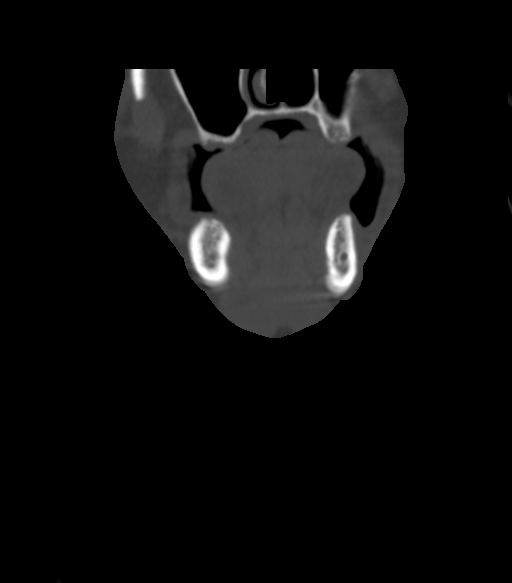
[im 44/110  bone]
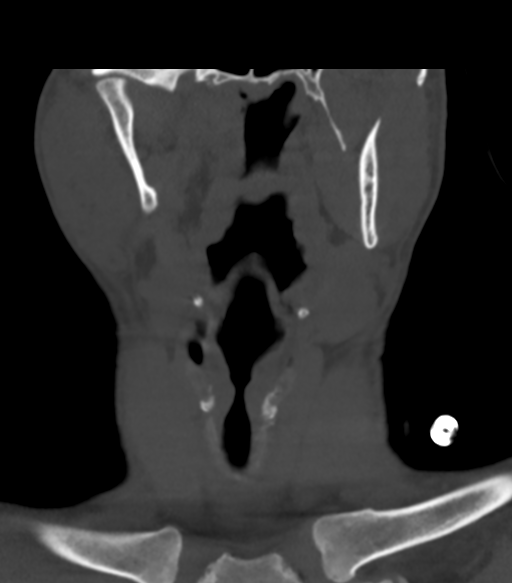
[im 66/110  bone]
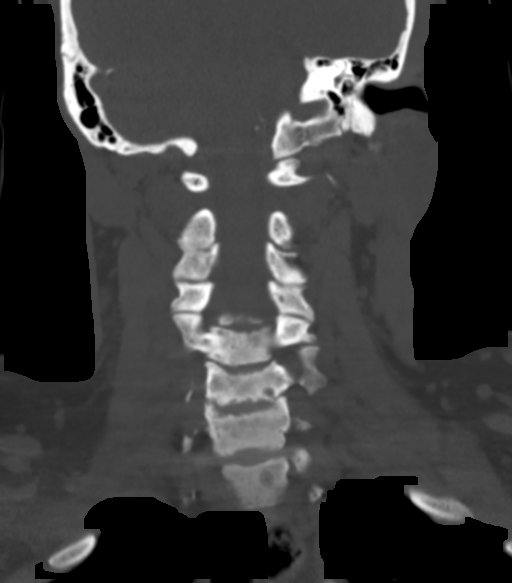

[Series 8: sagittal st · sagittal · 0.43mm/px · 5 of 101 slices shown, 6 images]
[im 34/101  bone]
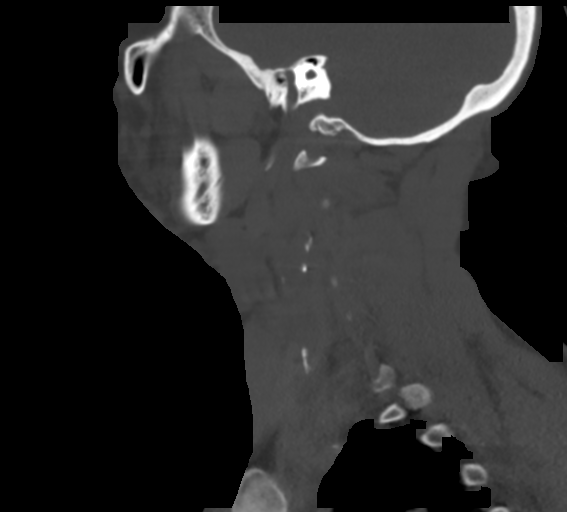
[im 42/101  bone]
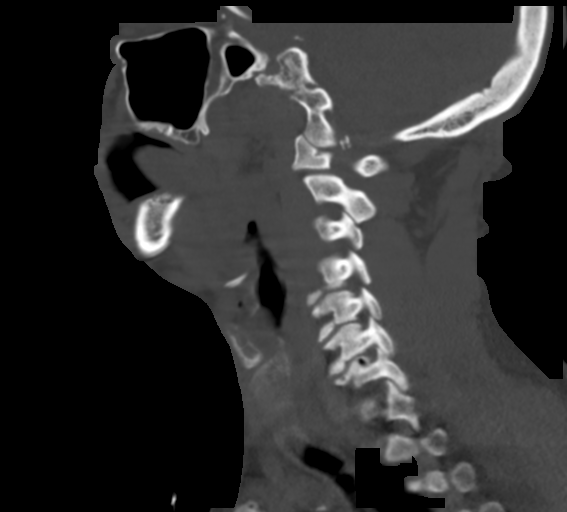
[im 51/101  soft-tissue]
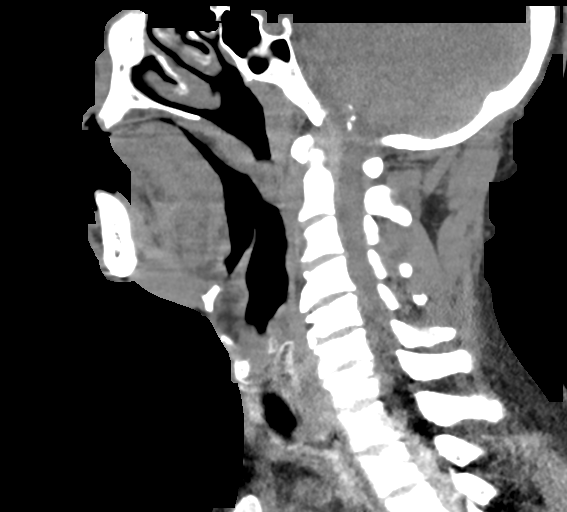
[im 51/101  bone]
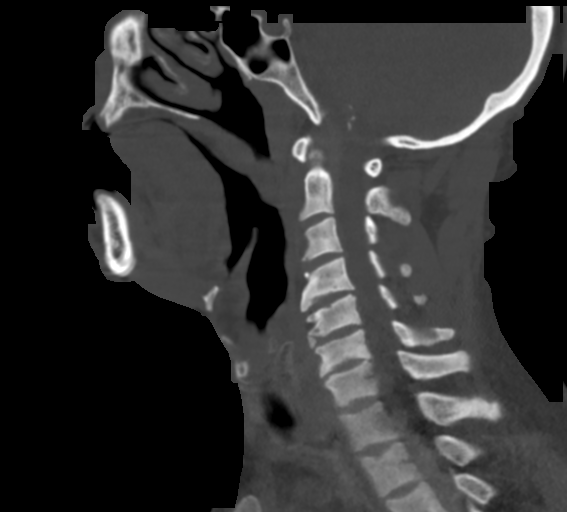
[im 59/101  bone]
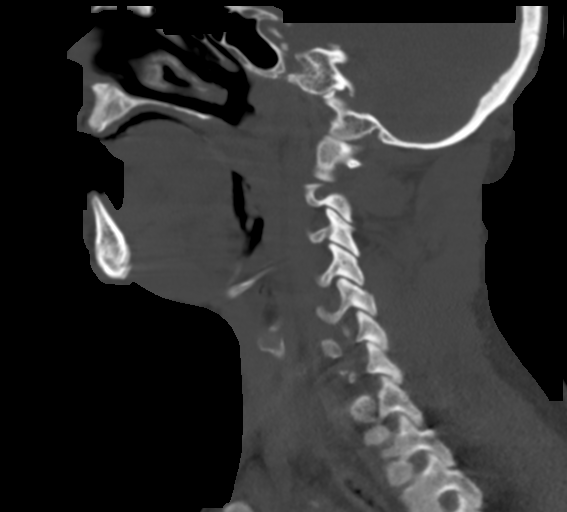
[im 67/101  bone]
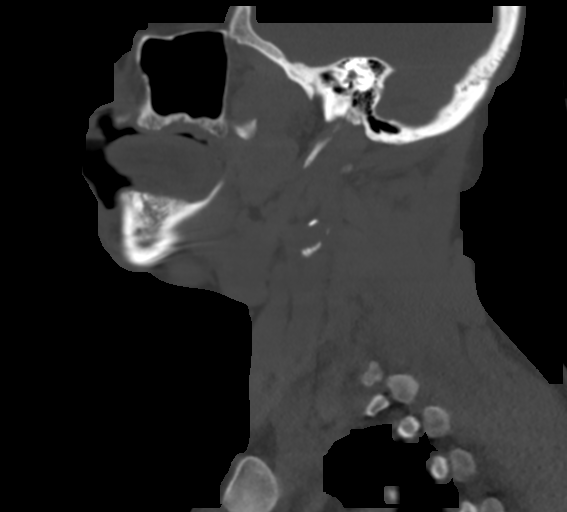

[Series 9: orthogonal st · axial · 0.39mm/px · z∈[-212,-104]mm · 3 of 108 slices shown]
[im 27/108  bone]
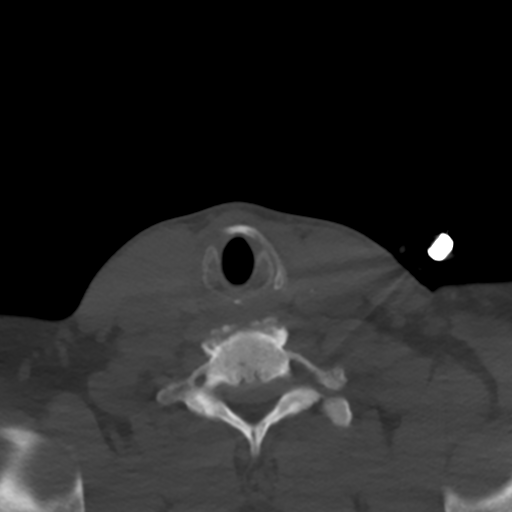
[im 54/108  bone]
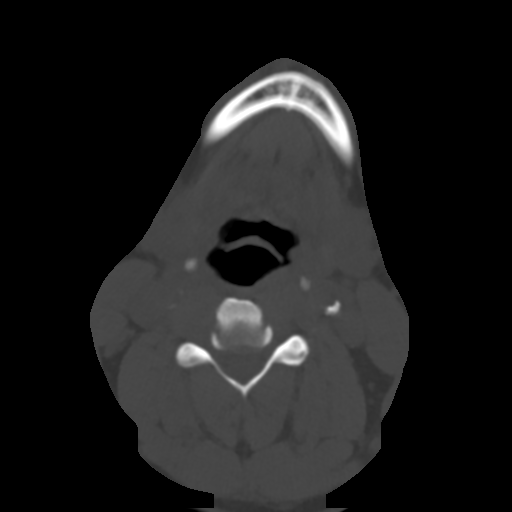
[im 81/108  bone]
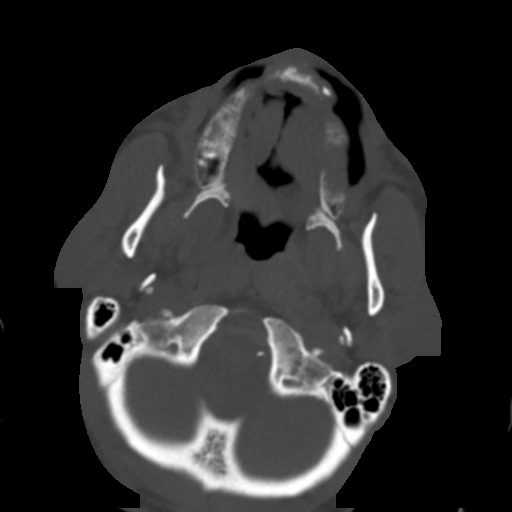

[16 of 33 positions shown; findings below may reference images not displayed]

FINDINGS: Pharynx and larynx: No mucosal or submucosal lesion is seen.

Salivary glands: Parotid and submandibular glands are normal.

Thyroid: Normal

Lymph nodes: No enlarged or low-density nodes seen on either side of
the neck.

Vascular: Carotid bifurcation region calcification. No acute
vascular finding evident.

Limited intracranial: Normal

Visualized orbits: Normal

Mastoids and visualized paranasal sinuses: Clear

Skeleton: Ordinary mid cervical spondylosis. No acute or likely
significant finding.

Upper chest: Negative

Other: No sign of inflammatory changes.
IMPRESSION: Negative CT scan of the neck. No abnormality seen to explain acute
pain. The

## 2018-03-19 MED ORDER — DEXTROSE 50 % IV SOLN: 1.0000 | INTRAVENOUS | Status: DC | PRN

## 2018-03-19 MED ORDER — SENNOSIDES-DOCUSATE SODIUM 8.6-50 MG PO TABS: 1.0000 | ORAL_TABLET | Freq: Every evening | ORAL | Status: DC | PRN

## 2018-03-19 MED ORDER — NICOTINE 7 MG/24HR TD PT24: 7.0000 mg | MEDICATED_PATCH | Freq: Every day | TRANSDERMAL | Status: DC

## 2018-03-19 MED ORDER — FOLIC ACID 1 MG PO TABS: 1.0000 mg | ORAL_TABLET | Freq: Every day | ORAL | 0 refills | Status: DC

## 2018-03-19 MED ORDER — THIAMINE HCL 100 MG PO TABS: 100.0000 mg | ORAL_TABLET | Freq: Every day | ORAL | 0 refills | Status: DC

## 2018-03-19 MED ORDER — NICOTINE 7 MG/24HR TD PT24: 7.0000 mg | MEDICATED_PATCH | Freq: Every day | TRANSDERMAL | 0 refills | Status: DC

## 2018-03-19 MED ORDER — PIPERACILLIN-TAZOBACTAM 3.375 G IVPB: 3.3750 g | Freq: Two times a day (BID) | INTRAVENOUS | Status: DC

## 2018-03-19 MED ORDER — THIAMINE HCL 100 MG/ML IJ SOLN: 100.0000 mg | Freq: Every day | INTRAMUSCULAR | Status: DC

## 2018-03-19 MED ORDER — VITAMIN B-1 100 MG PO TABS: 100.0000 mg | ORAL_TABLET | Freq: Every day | ORAL | Status: DC

## 2018-03-19 MED ORDER — ACETAMINOPHEN 325 MG PO TABS: 650.0000 mg | ORAL_TABLET | Freq: Four times a day (QID) | ORAL | Status: DC | PRN

## 2018-03-19 MED ORDER — PIPERACILLIN-TAZOBACTAM 3.375 G IVPB 30 MIN
3.3750 g | Freq: Once | INTRAVENOUS | Status: AC
  Administered 2018-03-19: 3.375 g via INTRAVENOUS
  Filled 2018-03-19: qty 50

## 2018-03-19 MED ORDER — ACETAMINOPHEN 650 MG RE SUPP: 650.0000 mg | Freq: Four times a day (QID) | RECTAL | Status: DC | PRN

## 2018-03-19 MED ORDER — LACTULOSE 10 GM/15ML PO SOLN
20.0000 g | Freq: Once | ORAL | Status: DC
Start: 2018-03-19 — End: 2018-03-19

## 2018-03-19 MED ORDER — FOLIC ACID 1 MG PO TABS: 1.0000 mg | ORAL_TABLET | Freq: Every day | ORAL | Status: DC

## 2018-03-19 MED ORDER — VANCOMYCIN HCL 10 G IV SOLR
1500.0000 mg | Freq: Once | INTRAVENOUS | Status: DC
  Filled 2018-03-19: qty 1500

## 2018-03-19 MED ORDER — HEPARIN SODIUM (PORCINE) 5000 UNIT/ML IJ SOLN: 5000.0000 [IU] | Freq: Three times a day (TID) | INTRAMUSCULAR | Status: DC

## 2018-03-19 MED ORDER — LACTULOSE ENEMA
300.0000 mL | Freq: Once | ORAL | Status: DC
  Filled 2018-03-19: qty 300

## 2018-03-19 MED ORDER — ADULT MULTIVITAMIN W/MINERALS CH: 1.0000 | ORAL_TABLET | Freq: Every day | ORAL | Status: DC

## 2018-03-19 NOTE — ED Triage Notes (Addendum)
Pt here from home after calling out for "throat pain that was an emergency." Upon EMS arrival, pt was responsive to pain only. CBG on arrival 46. Recheck CBG WDL No hx of diabetes. Given 12.5g D50 IV by EMS. Intermittently responsive to voice for EMS, follows commands for this RN. Pt receives dialysis MWF.

## 2018-03-19 NOTE — Assessment & Plan Note (Deleted)
History of +HepBcore Ab 11/17 but repeat negative in Care Everywhere 8/18

## 2018-03-19 NOTE — H&P (Signed)
Date: 03/19/2018               Patient Name:  Jordan [name] MRN: [id number]  DOB: 1966-02-08 Age / Sex: 52 y.o., male   PCP: Patient, No Pcp Per         Medical Service: Internal Medicine Teaching Service         Attending Physician: Dr. Aldine Contes, MD    First Contact: Dr. Trilby Drummer Pager: [phone]  Second Contact: Dr. Danford Bad Pager: [phone]       After Hours (After 5p/  First Contact Pager: [phone]  weekends / holidays): Second Contact Pager: [phone]   Chief Complaint: Encephalopathy, Throat Pain  History of Present Illness:  Mr Ulin is a 52 yo M with Hx of ESRD (HD MWF); HTN; Alcohol Use, and Anemia who presented with encephalopathy a throat pain. Patient had been difficult to arouse earlier so history was obtained with assistance of chart review. His roommate had called the 911 for the patient's sore throat and inability to be roused. His brother had reportedly helped him home the night prior as he was stumbling and weak. Patient was found lying on the floor with blankets. Initially not arousable, but would then respond to some questions. CBG was initially normal but repeat en route was in the 50s and patient was given D50. Patient was intermittently arousable for EMS and ED provider. He states that he had noticed 2 days of a sore throat and feeling tired. 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 He states he just started feeling more alert about 20 minutes before he was seen by me. He endorses 2 days of cough intermittently productive of clear sputum. He denies fevers, chills, headache, Vision changes, Nausea, diarrhea, constipation, or changes in urination.  In the ED: - Vitals: Stable, initial Temp 96.7 - Labs: CMP showed HCO3 20 (GAP 23), BUN 35, Cr 12.9 (ESRD previous Cr 9 one yr ago), Glucose 71; CBC showed Hgb 11.2 (at baseline); ETOH 14; Ammonia 82; Troponin was negative; Lactate 3.17 (ESRD and ETOH positive); VBG 7.258  58  30; Strep  PCR was ordered; and blood cultures were obtained - EKG: personally reviewed my interpretation is NSR, Biphasic T-wave in III and aVF, ST-elevation V1,V2,V3 (early repolarization suspected) - CXR: Low lung volumes, no acute disease - CT Head: No acute or significant brain finding. Mild chronic small-vessel change of the hemispheric white matter - CT Neck: Negative CT scan of the neck. No abnormality seen to explain acute pain.  - Interventions: Zosyn, vanc (d/c'd before administered), lactulose (d/c's before administered)  Patient to admitted for further workup and care  Meds: Current Meds  Medication Sig  . hydrOXYzine (ATARAX/VISTARIL) 50 MG tablet Take 50 mg by mouth as needed.  Marland Kitchen RENVELA 800 MG tablet Take 800 mg by mouth 2 (two) times daily.    Allergies: Allergies as of 03/19/2018  . (No Known Allergies)   Past Medical History:  Diagnosis Date  . Anemia   . CKD (chronic kidney disease)    Stage 5  Dialysis - M/W/F  . ETOH abuse   . Hypertension     Family History:  Family History  Problem Relation Age of Onset  . Diabetes Mother   . Kidney failure Mother   . Kidney failure Brother   . Post-traumatic stress disorder Neg Hx   . Bladder Cancer Neg Hx   . Kidney cancer Neg Hx   - Reviewed on  Admission  Social History:  Social History   Tobacco Use  . Smoking status: Current Every Day Smoker    Packs/day: 0.05    Years: 15.00    Pack years: 0.75    Types: Cigarettes  . Smokeless tobacco: Never Used  . Tobacco comment: down to 1 a day  Substance Use Topics  . Alcohol use: Yes    Alcohol/week: 12.6 oz    Types: 21 Cans of beer per week    Comment: none since 08/04/16  . Drug use: No  - Reviewed on Admission  Review of Systems: A complete ROS was negative except as per HPI.  Physical Exam: Blood pressure 134/83, pulse 86, temperature (!) 96.7 F (35.9 C), temperature source Rectal, resp. rate (!) 22, height 5\' 6"  (1.676 m), weight 160 lb (72.6 kg), SpO2  98 %. Physical Exam  Constitutional: He is oriented to person, place, and time. He appears well-developed and well-nourished. No distress.  HENT:  Head: Normocephalic and atraumatic.  macroglossia  Eyes: EOM are normal. Right eye exhibits no discharge. Left eye exhibits no discharge.  Cardiovascular: Normal rate, regular rhythm, normal heart sounds and intact distal pulses.  Pulmonary/Chest: Effort normal and breath sounds normal. No respiratory distress.  Abdominal: Soft. Bowel sounds are normal. He exhibits no distension. There is no tenderness.  Musculoskeletal: He exhibits no edema or deformity.  Neurological: He is alert and oriented to person, place, and time.  No asterixis  Skin: Skin is warm and dry.   EKG: personally reviewed my interpretation is NSR, Biphasic T-wave in III and aVF, ST-elevation V1,V2,V3 (early repolarization suspected)  CXR: personally reviewed my interpretation is low lung volumes, rotated exam, no acute disease  Assessment & Plan by Problem: Active Problems:   ETOH abuse   ESRD (end stage renal disease) ([place])   Essential hypertension   Encephalopathy  Encephalopathy: Resolving. Patient presented from home after 911 was called for patient being difficult to rouse. Per EMS and ED reports patient was initially only intermittently responsive. Did have an episode of hypoglycemia with EMS and a GAP acidosis (rasing concern for toxic ingestion) and elevated lactate (raising concern for possible sepsis). Once patient was alert he denies any ingestion and will be proceeding with dialysis today, which would treat for most toxic ingestions. His hypo glycemia can be explained by alcohol consumption and his elevated lactate can be explained by ESRD status and alcohol consumption. CTs of the Head and Neck in ED were negative for acute abnormalities. Upon my exam patient was alert and oriented x3 asking for food and stating he had just been very tired for the past 2 days after  working outdoors in the heat. Patient's symptoms may be due to a combination of exhaustion, alcohol consumption, and hypoglycemia. Do not suspect sepsis or hepatic encephalopathy. > Patient's history suspicious for heavier alcohol use than he describes given ethanol level of 14 on admission, likely at least 12 hours after last drink given he had been asleep all night and morning. > Osmolar Gap 5 (normal) - Cardiac monitoring - HD as below - AM CMP and CBC  ESRD: On HD MWF. Complaint with his sessions. Last session on Wednesday. Missed session today. Patient has follow with Dr Emmit Pomfret and is being worked up for [facility] at [facility]. [facility] Consulted in the ED for HD, appreciate their assistance  Alcohol Use: Patient has history of alcohol use disorder with up to 21 drinks per week, currently endorses only about 4-5 drinks per  week. - CIWA without ativan  Pharyngitis: Patient describes 2 days of sore throat. Throat is mildly eryhtematous on exam. > Strep PCR pending  Hypertension: Patient has not taken antihypertensives (amlodipine, carvedilol) for at elast 1.5 years.  > BP elevated to 006J-494J systolic in ED - Will continue to monitor BP response following dialysis.  FEN: CM VTE ppx: Heparin Code Status: FULL  Dispo: Admit patient to Inpatient with expected length of stay greater than 2 midnights.  Signed: Neva Seat, MD 03/19/2018, 2:37 PM  Pager: [phone]

## 2018-03-19 NOTE — ED Notes (Signed)
Patient transported to CT 

## 2018-03-19 NOTE — ED Notes (Signed)
Patient transported to X-ray/CT scans

## 2018-03-19 NOTE — ED Notes (Signed)
Droplet precautions initiated and maintained.

## 2018-03-19 NOTE — Progress Notes (Signed)
Pharmacy Antibiotic Note  Jordan [name] is a 52 y.o. male admitted on 03/19/2018 with bacteremia.  Pharmacy has been consulted for vancomycin and Zosyn dosing. Patient is ESRD on HD MWF per notes. Uncertain when last session was.   Plan: Vancomycin 1500mg  IV x1 as loading dose- will follow HD schedule and tolerance for maintenance dosing Zosyn 3.375g IV x1 over 18min, then 3.375g IV q12h EI Follow c/s, clinical progression, HD schedule/tolernace, level PRN, LOT  Height: 5\' 6"  (167.6 cm) Weight: 160 lb (72.6 kg) IBW/kg (Calculated) : 63.8  Temp (24hrs), Avg:96.7 F (35.9 C), Min:96.7 F (35.9 C), Max:96.7 F (35.9 C)  Recent Labs  Lab 03/19/18 0910 03/19/18 1103 03/19/18 1118 03/19/18 1119  WBC  --  7.9  --   --   CREATININE 12.99*  --  13.10*  --   LATICACIDVEN  --   --   --  3.17*    Estimated Creatinine Clearance: 6.1 mL/min (A) (by C-G formula based on SCr of 13.1 mg/dL (H)).    No Known Allergies  Antimicrobials this admission: Vancomycin 6/28 >>  Zosyn 6/28 >>   Dose adjustments this admission: n/a  Microbiology results: 6/28 BCx:  6/28 Group A strep PCR:  Thank you for allowing pharmacy to be a part of this patient's care.  Jordan [name], PharmD, BCPS Clinical Pharmacist [phone] Please check AMION for all [place] numbers 03/19/2018 12:32 PM

## 2018-03-19 NOTE — Consult Note (Addendum)
[facility] Renal Consultation Note  Indication for Consultation:  Management of ESRD/hemodialysis; anemia, hypertension/volume and secondary hyperparathyroidism  HPI: Jordan [name] is a 52 y.o. male with ESRD nephrosclerosis  first hd 07/2016-  HTN, etoh abuse, tobacco abuse ,GIB  07/2016 gastritis /, hemorrhoids now admitted with AMS  With Hepatic encephalopathy( ammonia 82, ETOH 14 ), ETOH abuse , hypoglycemia. His last  Hd was  on schedule= Wednesday 03/17/18 . 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 By ER history = roommate called EMS sec. Pt difficult to arouse and co sore throat, BS in 50s  Given D50  By ems without much improvement . No reported problems with op hd, denies fevers, chills, cp, sob , abd pain and has been compliant with hd past weeks . Throat  pain cos in Er with CT Hd and Neck  Negative  For acute problems / CXR  No vol excess /and k 3.9 with Scr 13/ bun 34/ HGB 11.2/    Past Medical History:  Diagnosis Date  . Anemia   . CKD (chronic kidney disease)    Stage 5  Dialysis - M/W/F  . ETOH abuse   . Hypertension     Past Surgical History:  Procedure Laterality Date  . BASCILIC VEIN TRANSPOSITION Left 08/07/2016   Procedure: LEFT BASILIC VEIN TRANSPOSITION;  Surgeon: Angelia Mould, MD;  Location: [place];  Service: Vascular;  Laterality: Left;  . COLONOSCOPY N/A 08/12/2016   Procedure: COLONOSCOPY;  Surgeon: Otis Brace, MD;  Location: [place];  Service: Gastroenterology;  Laterality: N/A;  . ESOPHAGOGASTRODUODENOSCOPY N/A 08/12/2016   Procedure: ESOPHAGOGASTRODUODENOSCOPY (EGD);  Surgeon: Otis Brace, MD;  Location: [place];  Service: Gastroenterology;  Laterality: N/A;  . INSERTION OF DIALYSIS CATHETER N/A 08/07/2016   Procedure: INSERTION OF TUNNELED DIALYSIS CATHETER;  Surgeon: Angelia Mould, MD;  Location: [place];   Service: Vascular;  Laterality: N/A;  . LIGATION OF ARTERIOVENOUS  FISTULA Left 09/12/2016   Procedure: BANDING OF LEFT  ARTERIOVENOUS  FISTULA;  Surgeon: Angelia Mould, MD;  Location: [facility] OR;  Service: Vascular;  Laterality: Left;  . NO PAST SURGERIES        Family History  Problem Relation Age of Onset  . Diabetes Mother   . Kidney failure Mother   . Kidney failure Brother   . Post-traumatic stress disorder Neg Hx   . Bladder Cancer Neg Hx   . Kidney cancer Neg Hx       reports that he has been smoking cigarettes.  He has a 0.75 pack-year smoking history. He has never used smokeless tobacco. He reports that he drinks about 12.6 oz of alcohol per week. He reports that he does not use drugs.  No Known Allergies  Prior to Admission medications   Medication Sig Start Date End Date Taking? Authorizing Provider  hydrOXYzine (ATARAX/VISTARIL) 50 MG tablet Take 50 mg by mouth as needed. 12/25/17  Yes [provider]  RENVELA 800 MG tablet Take 800 mg by mouth 2 (two) times daily. 01/06/18  Yes [provider]  amLODipine (NORVASC) 10 MG tablet Take 1 tablet (10 mg total) by mouth daily. Patient not taking: Reported on 09/24/2016 08/13/16   Dessa Phi, DO  carvedilol (COREG) 6.25 MG tablet Take 1 tablet (6.25 mg total) by mouth 2 (two) times daily with a meal. Patient not taking: Reported on 09/24/2016 08/12/16  Dessa Phi, DO     Anti-infectives (From admission, onward)   Start     Dose/Rate Route Frequency Ordered Stop   03/20/18 0100  piperacillin-tazobactam (ZOSYN) IVPB 3.375 g  Status:  Discontinued     3.375 g 12.5 mL/hr over 240 Minutes Intravenous Every 12 hours 03/19/18 1206 03/19/18 1335   03/19/18 1215  piperacillin-tazobactam (ZOSYN) IVPB 3.375 g     3.375 g 100 mL/hr over 30 Minutes Intravenous  Once 03/19/18 1206 03/19/18 1306   03/19/18 1215  vancomycin (VANCOCIN) 1,500 mg in sodium chloride 0.9 % 500 mL IVPB  Status:  Discontinued     1,500  mg 250 mL/hr over 120 Minutes Intravenous  Once 03/19/18 1206 03/19/18 1316      Results for orders placed or performed during the hospital encounter of 03/19/18 (from the past 48 hour(s))  Comprehensive metabolic panel     Status: Abnormal   Collection Time: 03/19/18  9:10 AM  Result Value Ref Range   Sodium 139 135 - 145 mmol/L   Potassium 3.9 3.5 - 5.1 mmol/L   Chloride 96 (L) 98 - 111 mmol/L    Comment: Please note change in reference range.   CO2 20 (L) 22 - 32 mmol/L   Glucose, Bld 71 70 - 99 mg/dL    Comment: Please note change in reference range.   BUN 34 (H) 6 - 20 mg/dL    Comment: Please note change in reference range.   Creatinine, Ser 12.99 (H) 0.61 - 1.24 mg/dL   Calcium 8.1 (L) 8.9 - 10.3 mg/dL   Total Protein 8.1 6.5 - 8.1 g/dL   Albumin 3.7 3.5 - 5.0 g/dL   AST 22 15 - 41 U/L   ALT 18 0 - 44 U/L    Comment: Please note change in reference range.   Alkaline Phosphatase 81 38 - 126 U/L   Total Bilirubin 0.8 0.3 - 1.2 mg/dL   GFR calc non Af Amer 4 (L) >60 mL/min   GFR calc Af Amer 4 (L) >60 mL/min    Comment: (NOTE) The eGFR has been calculated using the CKD EPI equation. This calculation has not been validated in all clinical situations. eGFR's persistently <60 mL/min signify possible Chronic Kidney Disease.    Anion gap 23 (H) 5 - 15    Comment: Performed at [facility], [place] [address], [place], [place] [address]  CBG monitoring, ED     Status: None   Collection Time: 03/19/18  9:15 AM  Result Value Ref Range   Glucose-Capillary 86 70 - 99 mg/dL   Comment 1 Notify RN    Comment 2 Document in Chart   Group A Strep by PCR     Status: None   Collection Time: 03/19/18 10:43 AM  Result Value Ref Range   Group A Strep by PCR NOT DETECTED NOT DETECTED    Comment: Performed at [facility], [place] [address], [place], [place] [address]  CBC with Differential/Platelet     Status: Abnormal   Collection Time: 03/19/18 11:03 AM  Result Value Ref Range    WBC 7.9 4.0 - 10.5 K/uL   RBC 3.44 (L) 4.22 - 5.81 MIL/uL   Hemoglobin 11.2 (L) 13.0 - 17.0 g/dL   HCT 35.0 (L) 39.0 - 52.0 %   MCV 101.7 (H) 78.0 - 100.0 fL   MCH 32.6 26.0 - 34.0 pg   MCHC 32.0 30.0 - 36.0 g/dL   RDW 14.2 11.5 -  15.5 %   Platelets 201 150 - 400 K/uL   Neutrophils Relative % 56 %   Neutro Abs 4.5 1.7 - 7.7 K/uL   Lymphocytes Relative 25 %   Lymphs Abs 1.9 0.7 - 4.0 K/uL   Monocytes Relative 10 %   Monocytes Absolute 0.8 0.1 - 1.0 K/uL   Eosinophils Relative 8 %   Eosinophils Absolute 0.7 0.0 - 0.7 K/uL   Basophils Relative 1 %   Basophils Absolute 0.0 0.0 - 0.1 K/uL   Immature Granulocytes 1 %   Abs Immature Granulocytes 0.1 0.0 - 0.1 K/uL    Comment: Performed at [place] [address], [place], [place] [address]  Ethanol     Status: Abnormal   Collection Time: 03/19/18 11:03 AM  Result Value Ref Range   Alcohol, Ethyl (B) 14 (H) <10 mg/dL    Comment: (NOTE) Lowest detectable limit for serum alcohol is 10 mg/dL. For medical purposes only. Performed at [facility], [place] [address] [place], [place] [address]   TSH     Status: None   Collection Time: 03/19/18 11:03 AM  Result Value Ref Range   TSH 0.453 0.350 - 4.500 uIU/mL    Comment: Performed by a 3rd Generation assay with a functional sensitivity of <=0.01 uIU/mL. Performed at [facility], [place] [address], [place], [place] [address]   Ammonia     Status: Abnormal   Collection Time: 03/19/18 11:03 AM  Result Value Ref Range   Ammonia 82 (H) 9 - 35 umol/L    Comment: Performed at [facility] [address], [place], [place] [address]  Osmolality     Status: Abnormal   Collection Time: 03/19/18 11:03 AM  Result Value Ref Range   Osmolality 303 (H) 275 - 295 mOsm/kg    Comment: Performed at [facility], [place] [address], [place], [place] [address]  I-stat troponin, ED     Status: None   Collection Time: 03/19/18 11:17 AM  Result Value Ref Range   Troponin i,  poc 0.00 0.00 - 0.08 ng/mL   Comment 3            Comment: Due to the release kinetics of cTnI, a negative result within the first hours of the onset of symptoms does not rule out myocardial infarction with certainty. If myocardial infarction is still suspected, repeat the test at appropriate intervals.   I-stat chem 8, ed     Status: Abnormal   Collection Time: 03/19/18 11:18 AM  Result Value Ref Range   Sodium 139 135 - 145 mmol/L   Potassium 3.6 3.5 - 5.1 mmol/L   Chloride 96 (L) 98 - 111 mmol/L   BUN 34 (H) 6 - 20 mg/dL   Creatinine, Ser 13.10 (H) 0.61 - 1.24 mg/dL   Glucose, Bld 48 (L) 70 - 99 mg/dL   Calcium, Ion 0.98 (L) 1.15 - 1.40 mmol/L   TCO2 24 22 - 32 mmol/L   Hemoglobin 11.2 (L) 13.0 - 17.0 g/dL   HCT 33.0 (L) 39.0 - 52.0 %  I-Stat CG4 Lactic Acid, ED     Status: Abnormal   Collection Time: 03/19/18 11:19 AM  Result Value Ref Range   Lactic Acid, Venous 3.17 (HH) 0.5 - 1.9 mmol/L   Comment NOTIFIED PHYSICIAN   I-Stat venous blood gas, ED     Status: Abnormal   Collection Time: 03/19/18 11:20 AM  Result Value Ref Range  pH, Ven 7.258 7.250 - 7.430   pCO2, Ven 58.6 44.0 - 60.0 mmHg   pO2, Ven 30.0 (LL) 32.0 - 45.0 mmHg   Bicarbonate 26.2 20.0 - 28.0 mmol/L   TCO2 28 22 - 32 mmol/L   O2 Saturation 47.0 %   Acid-base deficit 2.0 0.0 - 2.0 mmol/L   Patient temperature HIDE    Sample type VENOUS    Comment NOTIFIED PHYSICIAN     ROS: see hpi  Physical Exam: Vitals:   03/19/18 1400 03/19/18 1501  BP: 134/83 (!) 141/88  Pulse: 86 90  Resp: (!) 22 18  Temp:    SpO2: 98% 99%     General: alert, AAM , NAD , Awoke from sleep, appropriate , soft spoken , wd ,WN HEENT: [place], EOMI , Sclera  mildly icteric , MMM Neck: no jvd Heart: RRR, No mrg Lungs: CTA , non labored breathing  Abdomen: BS +, soft ,NT, ND Extremities: no pedal edema Skin: no overt rash or pedal ulcers Neuro: Alert now OX3, no acute focal deficits  Dialysis Access: pos bruit LUA AVF    Dialysis Orders: Center: [facility]   on mwf . EDW 73 kg( leaving below needs dropped)  HD Bath 2 k, 2.25 ca  Time 4hr Heparin 2000. Access LUA AVF     Calcitriol 1.24mg / Parsabiv 5 mcg hd  mcg IV/HD  Mircera 30 mcg q 2 wks ( last  03/10/18)      Assessment/Plan  1. ESRD -  HD today on schedule MWF 2. Hepatic Encephalopathy - with Ammonia  82  and ETOH 14 . Rx per admit team  3. Hypertension/volume  - no excess vol on CXR or exam/ on amlodipine 10 mg  (make HS ) and Coreg 6.240mbid / fu bp trend  4. Anemia Of ESRD  - HGB 11.2 no esa needs , fu trne (last Mircera 3035m 03/10/18 5. Metabolic bone disease -  Po vit d on hd , Renvela binder  6. ETOH abuse - etoh  Level 14 on admit    DavErnest HaberA-C [phone]/[phone], 3:17 PM   Pt seen, examined and agree w A/P as above.  RobKelly Splinter CarNewell Rubbermaidger [id number]/2019, 4:38 PM

## 2018-03-19 NOTE — ED Notes (Signed)
Hospitalists at the bedside.

## 2018-03-19 NOTE — Progress Notes (Signed)
Inpatient Diabetes Program Recommendations  AACE/ADA: New Consensus Statement on Inpatient Glycemic Control (2015)  Target Ranges:  Prepandial:   less than 140 mg/dL      Peak postprandial:   less than 180 mg/dL (1-2 hours)      Critically ill patients:  140 - 180 mg/dL   Lab Results  Component Value Date   GLUCAP 86 03/19/2018   HGBA1C 5.5 03/19/2018    Patient does not have a history of DM A1c 5.5%.  Lab glucose 48 at 1120 am. Called RN Ryan to recheck a glucose one time to make sure patient was ok. RN reported patient came to floor about 1 hour ago and was hungry. Patient received crackers and a beverage at that time.  Thanks,  Tama Headings RN, MSN, BC-ADM, [facility] Inpatient Diabetes Coordinator Team Pager [phone] (8a-5p)

## 2018-03-19 NOTE — ED Notes (Signed)
Attempts made to gain 2nd IV access and draw labs unsuccessful. Able to draw enough blood for CMP. Will attempt to gain rest of lab work when patient comes back from x-ray/CT scans.

## 2018-03-19 NOTE — ED Provider Notes (Signed)
[facility] EMERGENCY DEPARTMENT Provider Note   CSN: [id number] Arrival date & time: 03/19/18  8527     History   Chief Complaint Chief Complaint  Patient presents with  . Altered Mental Status    HPI Jordan [name] is a 52 y.o. male hx of CKD on HD (last HD yesterday but MWF), ETOH abuse, HTN here with AMS.  Patient usually goes to dialysis Monday Wednesday Friday, he states that go to dialysis yesterday for extra dialysis but unclear if he actually went.  He cannot tell me how much he got dialyzed yesterday.  Today, patient called EMS for severe sore throat.  Patient was noted to be altered and CBG was 57.  Patient was given D50 by EMS.  Patient was noted to be very tired in triage and wakes up and then falls asleep.  The history is provided by the patient and the EMS personnel.  Level V caveat- AMS   Past Medical History:  Diagnosis Date  . Anemia   . CKD (chronic kidney disease)    Stage 5  Dialysis - M/W/F  . ETOH abuse   . Hypertension     Patient Active Problem List   Diagnosis Date Noted  . Tobacco abuse 08/04/2016  . ETOH abuse 08/04/2016  . Uncontrolled hypertension 08/04/2016  . Acute renal failure (ARF) ([place]) 08/04/2016  . Symptomatic anemia 08/04/2016  . Hypertensive emergency     Past Surgical History:  Procedure Laterality Date  . BASCILIC VEIN TRANSPOSITION Left 08/07/2016   Procedure: LEFT BASILIC VEIN TRANSPOSITION;  Surgeon: Angelia Mould, MD;  Location: [place];  Service: Vascular;  Laterality: Left;  . COLONOSCOPY N/A 08/12/2016   Procedure: COLONOSCOPY;  Surgeon: Otis Brace, MD;  Location: [place];  Service: Gastroenterology;  Laterality: N/A;  . ESOPHAGOGASTRODUODENOSCOPY N/A 08/12/2016   Procedure: ESOPHAGOGASTRODUODENOSCOPY (EGD);  Surgeon: Otis Brace, MD;  Location: [facility];  Service: Gastroenterology;  Laterality: N/A;  . INSERTION OF DIALYSIS CATHETER N/A 08/07/2016   Procedure: INSERTION OF  TUNNELED DIALYSIS CATHETER;  Surgeon: Angelia Mould, MD;  Location: [place] [place];  Service: Vascular;  Laterality: N/A;  . LIGATION OF ARTERIOVENOUS  FISTULA Left 09/12/2016   Procedure: BANDING OF LEFT  ARTERIOVENOUS  FISTULA;  Surgeon: Angelia Mould, MD;  Location: [facility];  Service: Vascular;  Laterality: Left;  . NO PAST SURGERIES          Home Medications    Prior to Admission medications   Medication Sig Start Date End Date Taking? Authorizing Provider  amLODipine (NORVASC) 10 MG tablet Take 1 tablet (10 mg total) by mouth daily. Patient not taking: Reported on 09/24/2016 08/13/16   Dessa Phi, DO  carvedilol (COREG) 6.25 MG tablet Take 1 tablet (6.25 mg total) by mouth 2 (two) times daily with a meal. Patient not taking: Reported on 09/24/2016 08/12/16   Dessa Phi, DO  ibuprofen (ADVIL,MOTRIN) 200 MG tablet Take 400 mg by mouth every 6 (six) hours as needed for headache or moderate pain.    [provider]  oxyCODONE-acetaminophen (PERCOCET/ROXICET) 5-325 MG tablet Take 1 tablet by mouth every 6 (six) hours as needed. Patient not taking: Reported on 09/24/2016 09/12/16   Virgina Jock A, PA-C  sildenafil (REVATIO) 20 MG tablet Take 1 to 5 tabs PO daily prn 02/26/17   Nickie Retort, MD    Family History Family History  Problem Relation Age of Onset  . Diabetes Mother   . Kidney failure Mother   .  Kidney failure Brother   . Post-traumatic stress disorder Neg Hx   . Bladder Cancer Neg Hx   . Kidney cancer Neg Hx     Social History Social History   Tobacco Use  . Smoking status: Current Every Day Smoker    Packs/day: 0.05    Years: 15.00    Pack years: 0.75    Types: Cigarettes  . Smokeless tobacco: Never Used  . Tobacco comment: down to 1 a day  Substance Use Topics  . Alcohol use: Yes    Alcohol/week: 12.6 oz    Types: 21 Cans of beer per week    Comment: none since 08/04/16  . Drug use: No     Allergies   Patient has no known  allergies.   Review of Systems Review of Systems  HENT: Positive for sore throat.   Psychiatric/Behavioral: Positive for confusion.  All other systems reviewed and are negative.    Physical Exam Updated Vital Signs BP (!) 162/104   Pulse 89   Temp (!) 96.7 F (35.9 C) (Rectal)   Resp 18   Ht 5\' 6"  (1.676 m)   Wt 72.6 kg (160 lb)   SpO2 100%   BMI 25.82 kg/m   Physical Exam  Constitutional:  Altered, confused. Wakes up to command but then falls asleep   HENT:  Head: Normocephalic.  OP slightly red, tonsils not enlarged   Eyes: Pupils are equal, round, and reactive to light. Conjunctivae and EOM are normal.  Neck:  Diffuse cervical LAD, some submandibular swelling   Cardiovascular: Normal rate, regular rhythm and normal heart sounds.  Pulmonary/Chest: Effort normal. No stridor. No respiratory distress. He has no wheezes.  Abdominal: Soft. Bowel sounds are normal. He exhibits no distension. There is no tenderness.  Musculoskeletal: Normal range of motion.  Neurological:  Sleepy, wakes up and then falls asleep. Moving all extremities   Skin: Skin is warm.  Psychiatric:  Unable   Nursing note and vitals reviewed.    ED Treatments / Results  Labs (all labs ordered are listed, but only abnormal results are displayed) Labs Reviewed  CBC WITH DIFFERENTIAL/PLATELET - Abnormal; Notable for the following components:      Result Value   RBC 3.44 (*)    Hemoglobin 11.2 (*)    HCT 35.0 (*)    MCV 101.7 (*)    All other components within normal limits  ETHANOL - Abnormal; Notable for the following components:   Alcohol, Ethyl (B) 14 (*)    All other components within normal limits  COMPREHENSIVE METABOLIC PANEL - Abnormal; Notable for the following components:   Chloride 96 (*)    CO2 20 (*)    BUN 34 (*)    Creatinine, Ser 12.99 (*)    Calcium 8.1 (*)    GFR calc non Af Amer 4 (*)    GFR calc Af Amer 4 (*)    Anion gap 23 (*)    All other components within normal  limits  AMMONIA - Abnormal; Notable for the following components:   Ammonia 82 (*)    All other components within normal limits  I-STAT CHEM 8, ED - Abnormal; Notable for the following components:   Chloride 96 (*)    BUN 34 (*)    Creatinine, Ser 13.10 (*)    Glucose, Bld 48 (*)    Calcium, Ion 0.98 (*)    Hemoglobin 11.2 (*)    HCT 33.0 (*)    All other components within  normal limits  I-STAT CG4 LACTIC ACID, ED - Abnormal; Notable for the following components:   Lactic Acid, Venous 3.17 (*)    All other components within normal limits  I-STAT VENOUS BLOOD GAS, ED - Abnormal; Notable for the following components:   pO2, Ven 30.0 (*)    All other components within normal limits  CULTURE, BLOOD (ROUTINE X 2)  CULTURE, BLOOD (ROUTINE X 2)  GROUP A STREP BY PCR  TSH  BLOOD GAS, VENOUS  CBG MONITORING, ED  I-STAT TROPONIN, ED  I-STAT CG4 LACTIC ACID, ED    EKG EKG Interpretation  Date/Time:  Friday March 19 2018 08:59:09 EDT Ventricular Rate:  84 PR Interval:    QRS Duration: 93 QT Interval:  431 QTC Calculation: 510 R Axis:   46 Text Interpretation:  Sinus rhythm Borderline T abnormalities, inferior leads ST elev, probable normal early repol pattern Prolonged QT interval No significant change since last tracing Confirmed by Wandra Arthurs [phone]) on 03/19/2018 9:45:06 AM   Radiology Dg Chest 2 View  Result Date: 03/19/2018 CLINICAL DATA:  Pt here from home after calling out for "throat pain that was an emergency." Upon EMS arrival, pt was responsive to pain only. CBG on arrival 49. Recheck CBG WDL No hx of diabetes EXAM: CHEST - 2 VIEW COMPARISON:  09/17/2016 FINDINGS: Low lung volumes with crowding of bronchovascular structures. No confluent airspace disease or overt interstitial edema. Heart size upper limits normal for technique. Aortic Atherosclerosis (ICD10-170.0). No effusion. No pneumothorax. Interval removal of tunneled right IJ hemodialysis catheter. Visualized bones  unremarkable. IMPRESSION: Low volumes.  No acute cardiopulmonary disease. Electronically Signed   By: Lucrezia Europe M.D.   On: 03/19/2018 10:01   Ct Head Wo Contrast  Result Date: 03/19/2018 CLINICAL DATA:  Acute presentation with throat pain. Dialysis patient. Mental status changes. EXAM: CT HEAD WITHOUT CONTRAST TECHNIQUE: Contiguous axial images were obtained from the base of the skull through the vertex without intravenous contrast. COMPARISON:  02/01/2013 FINDINGS: Brain: The brain does not show accelerated atrophy. There are mild chronic small-vessel ischemic changes of the white matter. No sign of acute infarction, mass lesion, hemorrhage, hydrocephalus or extra-axial collection. Vascular: There is atherosclerotic calcification of the major vessels at the base of the brain. Skull: Negative Sinuses/Orbits: Clear/normal Other: None IMPRESSION: No acute or significant brain finding. Mild chronic small-vessel change of the hemispheric white matter. Electronically Signed   By: Nelson Chimes M.D.   On: 03/19/2018 10:42   Ct Soft Tissue Neck Wo Contrast  Result Date: 03/19/2018 CLINICAL DATA:  Acute presentation with severe throat pain. EXAM: CT NECK WITHOUT CONTRAST TECHNIQUE: Multidetector CT imaging of the neck was performed following the standard protocol without intravenous contrast. COMPARISON:  02/01/2013 FINDINGS: Pharynx and larynx: No mucosal or submucosal lesion is seen. Salivary glands: Parotid and submandibular glands are normal. Thyroid: Normal Lymph nodes: No enlarged or low-density nodes seen on either side of the neck. Vascular: Carotid bifurcation region calcification. No acute vascular finding evident. Limited intracranial: Normal Visualized orbits: Normal Mastoids and visualized paranasal sinuses: Clear Skeleton: Ordinary mid cervical spondylosis. No acute or likely significant finding. Upper chest: Negative Other: No sign of inflammatory changes. IMPRESSION: Negative CT scan of the neck. No  abnormality seen to explain acute pain. The Electronically Signed   By: Nelson Chimes M.D.   On: 03/19/2018 10:40    Procedures Procedures (including critical care time)  CRITICAL CARE Performed by: Wandra Arthurs   Total critical care time:  30 minutes  Critical care time was exclusive of separately billable procedures and treating other patients.  Critical care was necessary to treat or prevent imminent or life-threatening deterioration.  Critical care was time spent personally by me on the following activities: development of treatment plan with patient and/or surrogate as well as nursing, discussions with consultants, evaluation of patient's response to treatment, examination of patient, obtaining history from patient or surrogate, ordering and performing treatments and interventions, ordering and review of laboratory studies, ordering and review of radiographic studies, pulse oximetry and re-evaluation of patient's condition.   Medications Ordered in ED Medications  piperacillin-tazobactam (ZOSYN) IVPB 3.375 g (3.375 g Intravenous New Bag/Given 03/19/18 1228)  piperacillin-tazobactam (ZOSYN) IVPB 3.375 g (has no administration in time range)  vancomycin (VANCOCIN) 1,500 mg in sodium chloride 0.9 % 500 mL IVPB (has no administration in time range)  lactulose (CHRONULAC) enema 200 gm (has no administration in time range)     Initial Impression / Assessment and Plan / ED Course  I have reviewed the triage vital signs and the nursing notes.  Pertinent labs & imaging results that were available during my care of the patient were reviewed by me and considered in my medical decision making (see chart for details).    Jordan [name] is a 52 y.o. male here with AMS. Patient is hypothermic. Patient has sore throat as well and neck swelling. Consider Ludwig vs RPA. Also consider pneumonia vs hypothyroidism. He is alcoholic as well so will get ammonia to r/o hepatic encephalopathy. Will get  labs, CT head, CT neck, lactate, cultures, ammonia levels. He doesn't urinate at baseline.   12:54 PM CT head/neck unremarkable. Lactate 3.1. CXR showed no obvious infiltrate. Given hypothermia, lactic acidosis, will give empiric abx. Of note, his Cr is 13, baseline is 9 so I think he likely didn't go to dialysis recently. His ETOH is 14 and ammonia is 82 so I think the AMS is likely hepatic encephalopathy. He is not awake for lactulose orally so will give lactulose enema. I called Dr. Melvia Heaps from nephrology to dialyze him. Internal medicine to admit for hepatic encephalopathy, renal failure, sepsis.   Final Clinical Impressions(s) / ED Diagnoses   Final diagnoses:  Hepatic encephalopathy ([place])  ESRD (end stage renal disease) [facility])    ED Discharge Orders    None       Drenda Freeze, MD 03/19/18 1254

## 2018-03-19 NOTE — Progress Notes (Signed)
Paged that patient is wanting to leave after HD. Upon entering the room the patient is resting comfortably. He voices that he would like to go home and is not sure why he was admitted to the hospital. Feels that he was excessively tired from working in the heat the day prior and is otherwise feeling fine. Denies fevers, chills, visual changes, headaches, N/V, abdominal pain, CP, SOB, cough, myalgias, arthralgias, new rash, diarrhea, hematuria, dysuria. He understands that we do not have a clear picture as to what caused his encephalopathy and was advised to stay in the hospital for further work-up. He again reiterated that he wants to leave. We discussed risks of leaving against medical advise including death. He voiced understanding and asked to leave.

## 2018-03-20 NOTE — Discharge Summary (Signed)
Patient Left Against Medical Advice  Name: Jordan [name] MRN: [id number] DOB: 10/30/1965 52 y.o. PCP: Patient, No Pcp Per  Date of Admission: 03/19/2018  8:55 AM Date of Discharge: 03/19/2018 Attending Physician: Aldine Contes, MD Discharge Diagnosis:  1. Encephalopathy 2. ESRD 3. ETOH abuse  Discharge Medications: Allergies as of 03/19/2018   No Known Allergies     Medication List    STOP taking these medications   amLODipine 10 MG tablet Commonly known as:  NORVASC   carvedilol 6.25 MG tablet Commonly known as:  COREG     TAKE these medications   folic acid 1 MG tablet Commonly known as:  FOLVITE Take 1 tablet (1 mg total) by mouth daily.   hydrOXYzine 50 MG tablet Commonly known as:  ATARAX/VISTARIL Take 50 mg by mouth as needed.   nicotine 7 mg/24hr patch Commonly known as:  NICODERM CQ - dosed in mg/24 hr Place 1 patch (7 mg total) onto the skin daily.   RENVELA 800 MG tablet Generic drug:  sevelamer carbonate Take 800 mg by mouth 2 (two) times daily.   thiamine 100 MG tablet Take 1 tablet (100 mg total) by mouth daily.       Disposition and follow-up:   Jordan [name] left Against Medical Advice.  At the hospital follow up visit please address:  Encephalopathy: - Monitor for recurrence of symptoms - Investigate extent of alcohol use further  ESRD:  - Ensure compliance with HD sessions - Monitor renal transplant workup status  Alcohol Use:  - Investigate further degree of use and counsel on reduction/cessation as appropriate  Pharyngitis:  - Monitor for resolution of symptoms  Hypertension:  2.  Labs / imaging needed at time of follow-up: None  3.  Pending labs/ test needing follow-up: None  Follow-up Appointments:   Hospital Course by problem list:  Encephalopathy: Patient presented from home after 911 was called for patient being difficult to rouse. Per EMS and ED reports patient was initially only intermittently  responsive and had 1-2 episodes of hypoglycemia. His labwork showed Anion Gap Acidosis with normal osmolar gap, elevated lactate, and ETOH level of 14.  He had CTs of the Head and Neck in ED, negative for acute abnormalities.Patient was alert by the time he was seen by internal medicine and denied any toxic ingestion, but endorsed alcohol use. His hypoglycemia and elevated lactate were likely due to alcohol use and ESRD status.  He claimed he had just been very tired for the past 2 days after working outdoors in the heat. Patient's symptoms may be due to a combination of exhaustion, alcohol consumption, and hypoglycemia. Sepsis or hepatic encephalopathy were not suspected. His workup was not completed as patient left AMA after finishing his dialysis session.  ESRD: On HD MWF. Complaint with his sessions. Last session on Wednesday prior to admission Missed session on Friday (day of admission). He was seen by nephrology and dialysis in the hospital. He subsequently left AMA after dialysis.   Alcohol Use: Patient has history of alcohol use disorder with up to 21 drinks per week, currently endorses only about 4-5 drinks per week. Suspect patient may drink more than reported and his lethargy and encephalopathy were related, in part, to alcohol use.  Pharyngitis: Patient describes 2 days of sore throat. Throat is mildly eryhtematous on exam. Strep PCR negative.  Hypertension: Patient has not taken antihypertensives (amlodipine, carvedilol) for at least 1.5 years. BP elevated to 585I-778E systolic in ED. Plan was to  follow up BP following dialysis, but patient left AMA  Discharge Vitals:   BP 135/73 (BP Location: Right Arm)   Pulse 90   Temp 98.4 F (36.9 C) (Oral)   Resp 18   Ht 5' 4"  (1.626 m)   Wt 161 lb 9.6 oz (73.3 kg)   SpO2 99%   BMI 27.74 kg/m   Pertinent Labs, Studies, and Procedures:  CBC Latest Ref Rng & Units 03/19/2018 03/19/2018 09/17/2016  WBC 4.0 - 10.5 K/uL - 7.9 6.9  Hemoglobin  13.0 - 17.0 g/dL 11.2(L) 11.2(L) 11.4(L)  Hematocrit 39.0 - 52.0 % 33.0(L) 35.0(L) 33.7(L)  Platelets 150 - 400 K/uL - 201 157   CMP Latest Ref Rng & Units 03/19/2018 03/19/2018 09/17/2016  Glucose 70 - 99 mg/dL 48(L) 71 62(L)  BUN 6 - 20 mg/dL 34(H) 34(H) 45(H)  Creatinine 0.61 - 1.24 mg/dL 13.10(H) 12.99(H) 9.86(H)  Sodium 135 - 145 mmol/L 139 139 139  Potassium 3.5 - 5.1 mmol/L 3.6 3.9 4.7  Chloride 98 - 111 mmol/L 96(L) 96(L) 101  CO2 22 - 32 mmol/L - 20(L) 26  Calcium 8.9 - 10.3 mg/dL - 8.1(L) 9.1  Total Protein 6.5 - 8.1 g/dL - 8.1 -  Total Bilirubin 0.3 - 1.2 mg/dL - 0.8 -  Alkaline Phos 38 - 126 U/L - 81 -  AST 15 - 41 U/L - 22 -  ALT 0 - 44 U/L - 18 -   Admission EKG: EKG Interpretation  Date/Time:  Friday March 19 2018 08:59:09 EDT Ventricular Rate:  84 PR Interval:    QRS Duration: 93 QT Interval:  431 QTC Calculation: 510 R Axis:   46 Text Interpretation:  Sinus rhythm Borderline T abnormalities, inferior leads ST elev, probable normal early repol pattern Prolonged QT interval No significant change since last tracing Confirmed by Wandra Arthurs [phone]) on 03/19/2018 9:45:06 AM  CXR: IMPRESSION: Low volumes.  No acute cardiopulmonary disease.  CT Head WO: IMPRESSION: No acute or significant brain finding. Mild chronic small-vessel change of the hemispheric white matter.  CT Soft Tissue Neck WO: IMPRESSION: Negative CT scan of the neck. No abnormality seen to explain acute pain.  Recent Results (from the past 2160 hour(s))  Comprehensive metabolic panel     Status: Abnormal   Collection Time: 03/19/18  9:10 AM  Result Value Ref Range   Sodium 139 135 - 145 mmol/L   Potassium 3.9 3.5 - 5.1 mmol/L   Chloride 96 (L) 98 - 111 mmol/L    Comment: Please note change in reference range.   CO2 20 (L) 22 - 32 mmol/L   Glucose, Bld 71 70 - 99 mg/dL    Comment: Please note change in reference range.   BUN 34 (H) 6 - 20 mg/dL    Comment: Please note change in reference  range.   Creatinine, Ser 12.99 (H) 0.61 - 1.24 mg/dL   Calcium 8.1 (L) 8.9 - 10.3 mg/dL   Total Protein 8.1 6.5 - 8.1 g/dL   Albumin 3.7 3.5 - 5.0 g/dL   AST 22 15 - 41 U/L   ALT 18 0 - 44 U/L    Comment: Please note change in reference range.   Alkaline Phosphatase 81 38 - 126 U/L   Total Bilirubin 0.8 0.3 - 1.2 mg/dL   GFR calc non Af Amer 4 (L) >60 mL/min   GFR calc Af Amer 4 (L) >60 mL/min    Comment: (NOTE) The eGFR has been calculated using the CKD EPI  equation. This calculation has not been validated in all clinical situations. eGFR's persistently <60 mL/min signify possible Chronic Kidney Disease.    Anion gap 23 (H) 5 - 15    Comment: Performed at [facility], [place] [address] [place], [place] [address]  CBG monitoring, ED     Status: None   Collection Time: 03/19/18  9:15 AM  Result Value Ref Range   Glucose-Capillary 86 70 - 99 mg/dL   Comment 1 Notify RN    Comment 2 Document in Chart   Group A Strep by PCR     Status: None   Collection Time: 03/19/18 10:43 AM  Result Value Ref Range   Group A Strep by PCR NOT DETECTED NOT DETECTED    Comment: Performed at [facility], [place] [address] [place], [place] [address]  Blood culture (routine x 2)     Status: None (Preliminary result)   Collection Time: 03/19/18 10:50 AM  Result Value Ref Range   Specimen Description BLOOD RIGHT WRIST    Special Requests      BOTTLES DRAWN AEROBIC ONLY Blood Culture results may not be optimal due to an inadequate volume of blood received in culture bottles   Culture      NO GROWTH < 24 HOURS Performed at [facility] [address] [place], [place] [address]    Report Status PENDING   Blood culture (routine x 2)     Status: None (Preliminary result)   Collection Time: 03/19/18 10:59 AM  Result Value Ref Range   Specimen Description BLOOD RIGHT HAND    Special Requests      BOTTLES DRAWN AEROBIC ONLY Blood Culture results may not be optimal due to an inadequate  volume of blood received in culture bottles   Culture      NO GROWTH < 24 HOURS Performed at [facility] [address], [place],  [address]    Report Status PENDING   CBC with Differential/Platelet     Status: Abnormal   Collection Time: 03/19/18 11:03 AM  Result Value Ref Range   WBC 7.9 4.0 - 10.5 K/uL   RBC 3.44 (L) 4.22 - 5.81 MIL/uL   Hemoglobin 11.2 (L) 13.0 - 17.0 g/dL   HCT 35.0 (L) 39.0 - 52.0 %   MCV 101.7 (H) 78.0 - 100.0 fL   MCH 32.6 26.0 - 34.0 pg   MCHC 32.0 30.0 - 36.0 g/dL   RDW 14.2 11.5 - 15.5 %   Platelets 201 150 - 400 K/uL   Neutrophils Relative % 56 %   Neutro Abs 4.5 1.7 - 7.7 K/uL   Lymphocytes Relative 25 %   Lymphs Abs 1.9 0.7 - 4.0 K/uL   Monocytes Relative 10 %   Monocytes Absolute 0.8 0.1 - 1.0 K/uL   Eosinophils Relative 8 %   Eosinophils Absolute 0.7 0.0 - 0.7 K/uL   Basophils Relative 1 %   Basophils Absolute 0.0 0.0 - 0.1 K/uL   Immature Granulocytes 1 %   Abs Immature Granulocytes 0.1 0.0 - 0.1 K/uL    Comment: Performed at [facility], [address] [place],  [address]  Ethanol     Status: Abnormal   Collection Time: 03/19/18 11:03 AM  Result Value Ref Range   Alcohol, Ethyl (B) 14 (H) <10 mg/dL    Comment: (NOTE) Lowest detectable limit for serum alcohol is 10 mg/dL. For medical purposes only. Performed at [facility], [place] [address] [place], [place] [address]   TSH     Status: None   Collection Time: 03/19/18 11:03 AM  Result Value Ref Range   TSH 0.453 0.350 - 4.500 uIU/mL    Comment: Performed by a 3rd Generation assay with a functional sensitivity of <=0.01 uIU/mL. Performed at [facility], [place] [address] [place], [place] [address]   Ammonia     Status: Abnormal   Collection Time: 03/19/18 11:03 AM  Result Value Ref Range   Ammonia 82 (H) 9 - 35 umol/L    Comment: Performed at [facility] [address] [place], [place] [address]  Osmolality     Status: Abnormal    Collection Time: 03/19/18 11:03 AM  Result Value Ref Range   Osmolality 303 (H) 275 - 295 mOsm/kg    Comment: Performed at [facility], [place] [address], [place], [place] [address]  I-stat troponin, ED     Status: None   Collection Time: 03/19/18 11:17 AM  Result Value Ref Range   Troponin i, poc 0.00 0.00 - 0.08 ng/mL   Comment 3            Comment: Due to the release kinetics of cTnI, a negative result within the first hours of the onset of symptoms does not rule out myocardial infarction with certainty. If myocardial infarction is still suspected, repeat the test at appropriate intervals.   I-stat chem 8, ed     Status: Abnormal   Collection Time: 03/19/18 11:18 AM  Result Value Ref Range   Sodium 139 135 - 145 mmol/L   Potassium 3.6 3.5 - 5.1 mmol/L   Chloride 96 (L) 98 - 111 mmol/L   BUN 34 (H) 6 - 20 mg/dL   Creatinine, Ser 13.10 (H) 0.61 - 1.24 mg/dL   Glucose, Bld 48 (L) 70 - 99 mg/dL   Calcium, Ion 0.98 (L) 1.15 - 1.40 mmol/L   TCO2 24 22 - 32 mmol/L   Hemoglobin 11.2 (L) 13.0 - 17.0 g/dL   HCT 33.0 (L) 39.0 - 52.0 %  I-Stat CG4 Lactic Acid, ED     Status: Abnormal   Collection Time: 03/19/18 11:19 AM  Result Value Ref Range   Lactic Acid, Venous 3.17 (HH) 0.5 - 1.9 mmol/L   Comment NOTIFIED PHYSICIAN   I-Stat venous blood gas, ED     Status: Abnormal   Collection Time: 03/19/18 11:20 AM  Result Value Ref Range   pH, Ven 7.258 7.250 - 7.430   pCO2, Ven 58.6 44.0 - 60.0 mmHg   pO2, Ven 30.0 (LL) 32.0 - 45.0 mmHg   Bicarbonate 26.2 20.0 - 28.0 mmol/L   TCO2 28 22 - 32 mmol/L   O2 Saturation 47.0 %   Acid-base deficit 2.0 0.0 - 2.0 mmol/L   Patient temperature HIDE    Sample type VENOUS    Comment NOTIFIED PHYSICIAN   Hemoglobin A1c     Status: None   Collection Time: 03/19/18  3:36 PM  Result Value Ref Range   Hgb A1c MFr Bld 5.5 4.8 - 5.6 %    Comment: (NOTE) Pre diabetes:          5.7%-6.4% Diabetes:              >6.4% Glycemic control for    <7.0% adults with diabetes    Mean Plasma Glucose 111.15 mg/dL    Comment: Performed at [facility], [address], [place], [place] [address]  Glucose, capillary     Status: Abnormal   Collection Time: 03/19/18  4:05 PM  Result Value Ref Range   Glucose-Capillary 116 (H) 70 - 99 mg/dL    Discharge Instructions: Discharge Instructions    Call MD for:  difficulty breathing, headache or visual disturbances   Complete by:  As directed    Call MD for:  extreme fatigue   Complete by:  As directed    Call MD for:  hives   Complete by:  As directed    Call MD for:  persistant dizziness or light-headedness   Complete by:  As directed    Call MD for:  persistant nausea and vomiting   Complete by:  As directed    Call MD for:  redness, tenderness, or signs of infection (pain, swelling, redness, odor or green/yellow discharge around incision site)   Complete by:  As directed    Call MD for:  severe uncontrolled pain   Complete by:  As directed    Call MD for:  temperature >100.4   Complete by:  As directed    Diet - low sodium heart healthy   Complete by:  As directed    Discharge instructions   Complete by:  As directed    Please follow up with Dr. Moshe Cipro or another physician within 1 week of leaving the hospital. If you start having chest pain, shortness of breath, return of confusion, sleepiness please return to the ED.  Please limit your alcohol intake.  Make sure that you are eating regularly throughout the day. I have sent in nicotine patches to your pharmacy; place a new patch every 24 hrs (remove old one).   Increase activity slowly   Complete by:  As directed       Signed: Neva Seat, MD 03/20/2018, 10:21 AM   Pager: [phone]

## 2018-03-24 LAB — CULTURE, BLOOD (ROUTINE X 2)
Culture: NO GROWTH
Culture: NO GROWTH

## 2018-04-15 ENCOUNTER — Ambulatory Visit: Payer: Medicaid Other | Admitting: Urology

## 2018-04-15 ENCOUNTER — Encounter: Payer: Self-pay | Admitting: Urology

## 2018-05-17 ENCOUNTER — Emergency Department (HOSPITAL_COMMUNITY): Payer: Medicare Other

## 2018-05-17 ENCOUNTER — Observation Stay (HOSPITAL_COMMUNITY)
Admission: EM | Admit: 2018-05-17 | Discharge: 2018-05-17 | Disposition: A | Discharge status: Home / Self Care | Payer: Medicare Other | Attending: Internal Medicine | Admitting: Internal Medicine

## 2018-05-17 ENCOUNTER — Encounter (HOSPITAL_COMMUNITY): Payer: Self-pay

## 2018-05-17 DIAGNOSIS — R778 Other specified abnormalities of plasma proteins: Secondary | ICD-10-CM | POA: Diagnosis present

## 2018-05-17 DIAGNOSIS — I120 Hypertensive chronic kidney disease with stage 5 chronic kidney disease or end stage renal disease: Secondary | ICD-10-CM | POA: Insufficient documentation

## 2018-05-17 DIAGNOSIS — I10 Essential (primary) hypertension: Secondary | ICD-10-CM | POA: Diagnosis not present

## 2018-05-17 DIAGNOSIS — N186 End stage renal disease: Secondary | ICD-10-CM | POA: Diagnosis not present

## 2018-05-17 DIAGNOSIS — Z79899 Other long term (current) drug therapy: Secondary | ICD-10-CM | POA: Insufficient documentation

## 2018-05-17 DIAGNOSIS — J811 Chronic pulmonary edema: Secondary | ICD-10-CM | POA: Diagnosis present

## 2018-05-17 DIAGNOSIS — Z9114 Patient's other noncompliance with medication regimen: Secondary | ICD-10-CM | POA: Diagnosis not present

## 2018-05-17 DIAGNOSIS — J9601 Acute respiratory failure with hypoxia: Principal | ICD-10-CM | POA: Diagnosis present

## 2018-05-17 DIAGNOSIS — Z992 Dependence on renal dialysis: Secondary | ICD-10-CM

## 2018-05-17 DIAGNOSIS — D631 Anemia in chronic kidney disease: Secondary | ICD-10-CM | POA: Diagnosis present

## 2018-05-17 DIAGNOSIS — I161 Hypertensive emergency: Secondary | ICD-10-CM | POA: Diagnosis not present

## 2018-05-17 DIAGNOSIS — F17210 Nicotine dependence, cigarettes, uncomplicated: Secondary | ICD-10-CM | POA: Insufficient documentation

## 2018-05-17 DIAGNOSIS — J810 Acute pulmonary edema: Secondary | ICD-10-CM

## 2018-05-17 DIAGNOSIS — E8770 Fluid overload, unspecified: Secondary | ICD-10-CM

## 2018-05-17 DIAGNOSIS — Z720 Tobacco use: Secondary | ICD-10-CM | POA: Diagnosis present

## 2018-05-17 DIAGNOSIS — R7989 Other specified abnormal findings of blood chemistry: Secondary | ICD-10-CM | POA: Diagnosis not present

## 2018-05-17 DIAGNOSIS — F1010 Alcohol abuse, uncomplicated: Secondary | ICD-10-CM | POA: Diagnosis not present

## 2018-05-17 LAB — BASIC METABOLIC PANEL
Anion gap: 19 — ABNORMAL HIGH (ref 5–15)
Anion gap: 14 (ref 5–15)
BUN: 44 mg/dL — ABNORMAL HIGH (ref 6–20)
BUN: 47 mg/dL — ABNORMAL HIGH (ref 6–20)
CO2: 24 mmol/L (ref 22–32)
CO2: 24 mmol/L (ref 22–32)
Calcium: 8.6 mg/dL — ABNORMAL LOW (ref 8.9–10.3)
Calcium: 8.2 mg/dL — ABNORMAL LOW (ref 8.9–10.3)
Chloride: 94 mmol/L — ABNORMAL LOW (ref 98–111)
Chloride: 98 mmol/L (ref 98–111)
Creatinine, Ser: 12.84 mg/dL — ABNORMAL HIGH (ref 0.61–1.24)
Creatinine, Ser: 13.42 mg/dL — ABNORMAL HIGH (ref 0.61–1.24)
GFR calc Af Amer: 5 mL/min — ABNORMAL LOW (ref >60)
GFR calc Af Amer: 4 mL/min — ABNORMAL LOW (ref >60)
GFR calc non Af Amer: 4 mL/min — ABNORMAL LOW (ref >60)
GFR calc non Af Amer: 4 mL/min — ABNORMAL LOW (ref >60)
Glucose, Bld: 111 mg/dL — ABNORMAL HIGH (ref 70–99)
Glucose, Bld: 114 mg/dL — ABNORMAL HIGH (ref 70–99)
Potassium: 3.8 mmol/L (ref 3.5–5.1)
Potassium: 4 mmol/L (ref 3.5–5.1)
Sodium: 137 mmol/L (ref 135–145)
Sodium: 136 mmol/L (ref 135–145)

## 2018-05-17 LAB — CBC WITH DIFFERENTIAL/PLATELET
Abs Immature Granulocytes: 0.1 10*3/uL (ref 0.0–0.1)
Basophils Absolute: 0 10*3/uL (ref 0.0–0.1)
Basophils Relative: 0 %
Eosinophils Absolute: 0.8 10*3/uL — ABNORMAL HIGH (ref 0.0–0.7)
Eosinophils Relative: 8 %
HCT: 28.4 % — ABNORMAL LOW (ref 39.0–52.0)
Hemoglobin: 9.2 g/dL — ABNORMAL LOW (ref 13.0–17.0)
Immature Granulocytes: 1 %
Lymphocytes Relative: 15 %
Lymphs Abs: 1.5 10*3/uL (ref 0.7–4.0)
MCH: 33.9 pg (ref 26.0–34.0)
MCHC: 32.4 g/dL (ref 30.0–36.0)
MCV: 104.8 fL — ABNORMAL HIGH (ref 78.0–100.0)
Monocytes Absolute: 0.7 10*3/uL (ref 0.1–1.0)
Monocytes Relative: 7 %
Neutro Abs: 7.2 10*3/uL (ref 1.7–7.7)
Neutrophils Relative %: 69 %
Platelets: 165 10*3/uL (ref 150–400)
RBC: 2.71 MIL/uL — ABNORMAL LOW (ref 4.22–5.81)
RDW: 14 % (ref 11.5–15.5)
WBC: 10.3 10*3/uL (ref 4.0–10.5)

## 2018-05-17 LAB — GLUCOSE, CAPILLARY: Glucose-Capillary: 231 mg/dL — ABNORMAL HIGH (ref 70–99)

## 2018-05-17 LAB — LIPID PANEL
Cholesterol: 145 mg/dL (ref 0–200)
HDL: 34 mg/dL — ABNORMAL LOW (ref >40)
LDL Cholesterol: 98 mg/dL (ref 0–99)
Total CHOL/HDL Ratio: 4.3 RATIO
Triglycerides: 67 mg/dL (ref <150)
VLDL: 13 mg/dL (ref 0–40)

## 2018-05-17 LAB — TROPONIN I
Troponin I: 0.04 ng/mL (ref <0.03)
Troponin I: 0.05 ng/mL (ref <0.03)
Troponin I: 0.04 ng/mL (ref <0.03)

## 2018-05-17 LAB — CBC
HCT: 23 % — ABNORMAL LOW (ref 39.0–52.0)
Hemoglobin: 7.5 g/dL — ABNORMAL LOW (ref 13.0–17.0)
MCH: 33.9 pg (ref 26.0–34.0)
MCHC: 32.6 g/dL (ref 30.0–36.0)
MCV: 104.1 fL — ABNORMAL HIGH (ref 78.0–100.0)
Platelets: 143 10*3/uL — ABNORMAL LOW (ref 150–400)
RBC: 2.21 MIL/uL — ABNORMAL LOW (ref 4.22–5.81)
RDW: 13.8 % (ref 11.5–15.5)
WBC: 9.3 10*3/uL (ref 4.0–10.5)

## 2018-05-17 LAB — HEMOGLOBIN A1C
Hgb A1c MFr Bld: 4.7 % — ABNORMAL LOW (ref 4.8–5.6)
Mean Plasma Glucose: 88.19 mg/dL

## 2018-05-17 LAB — HIV ANTIBODY (ROUTINE TESTING W REFLEX): HIV Screen 4th Generation wRfx: NONREACTIVE

## 2018-05-17 LAB — MAGNESIUM: Magnesium: 2.3 mg/dL (ref 1.7–2.4)

## 2018-05-17 IMAGING — DX DG CHEST 1V PORT
1 series · 1 of 1 positions shown · non-contrast
Comparison: Chest radiograph dated [DATE]

CLINICAL DATA: 51-year-old male with respiratory distress.

EXAM:
PORTABLE CHEST 1 VIEW

[chest ap]
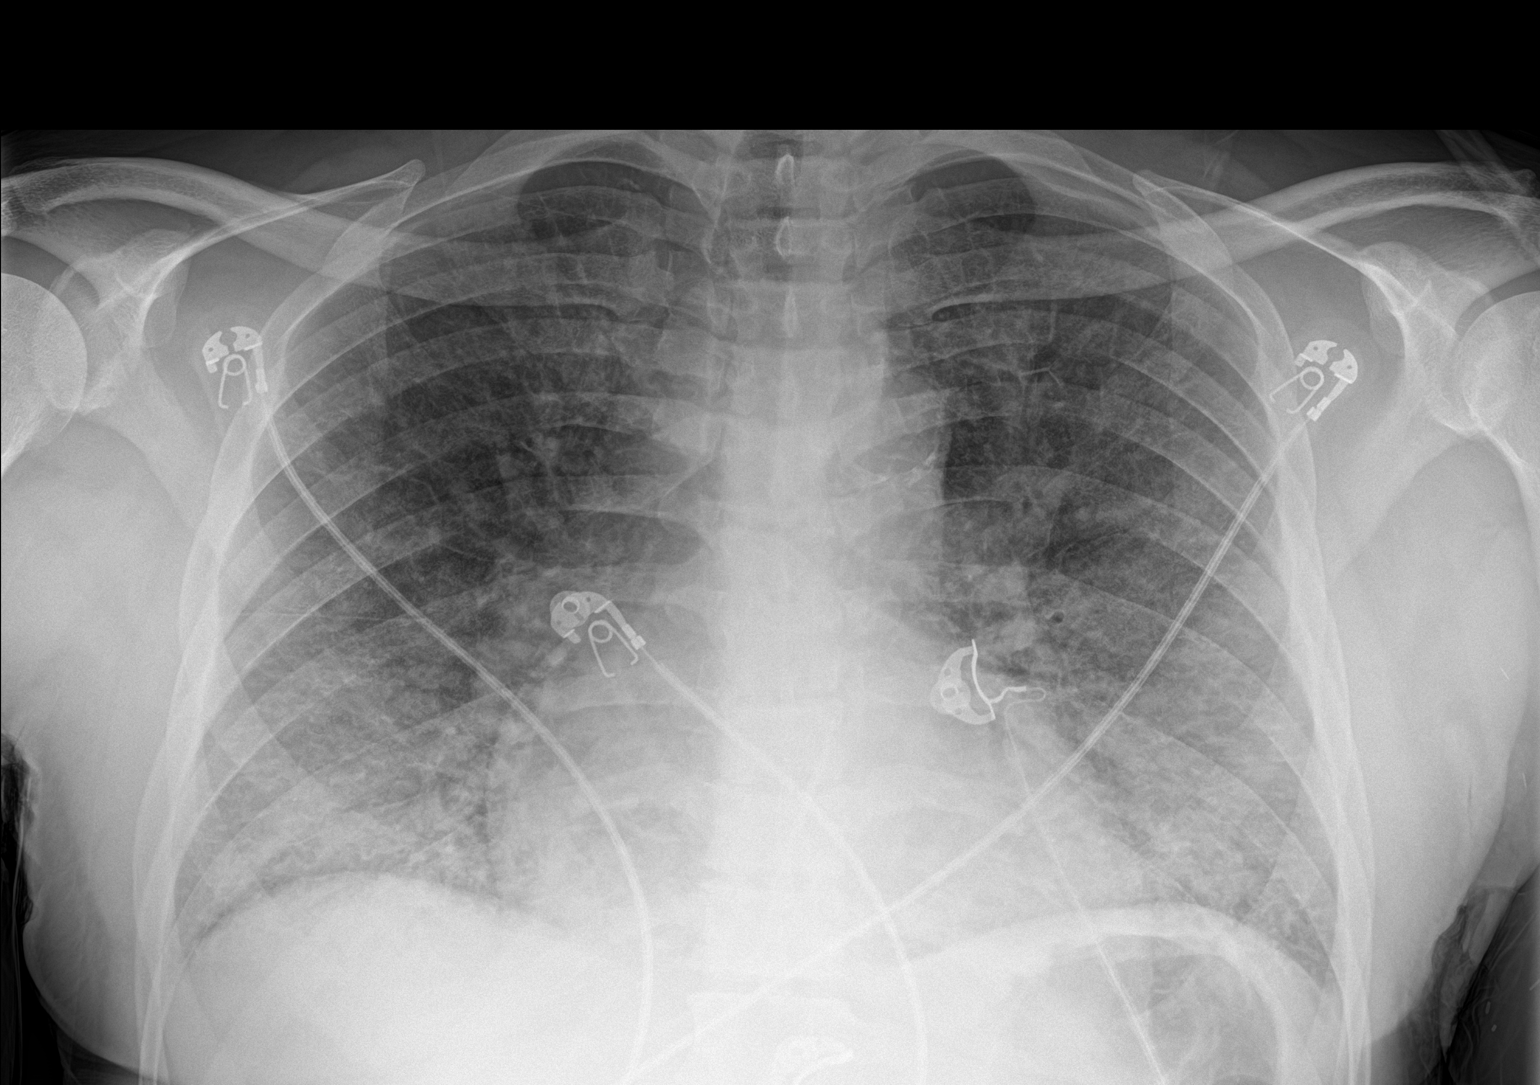

[1 of 1 positions shown; findings below may reference images not displayed]

FINDINGS: Mild cardiomegaly. There is diffuse interstitial prominence most
consistent with edema. No pleural effusion or pneumothorax. No acute
osseous pathology.
IMPRESSION: Cardiomegaly with pulmonary edema. Pneumonia is not excluded.
Clinical correlation is recommended.

## 2018-05-17 MED ORDER — MORPHINE SULFATE (PF) 2 MG/ML IV SOLN: 2.0000 mg | INTRAVENOUS | Status: DC | PRN

## 2018-05-17 MED ORDER — ADULT MULTIVITAMIN W/MINERALS CH
1.0000 | ORAL_TABLET | Freq: Every day | ORAL | Status: DC
  Administered 2018-05-17: 1 via ORAL
  Filled 2018-05-17: qty 1

## 2018-05-17 MED ORDER — HYDRALAZINE HCL 20 MG/ML IJ SOLN: 5.0000 mg | INTRAMUSCULAR | Status: DC | PRN

## 2018-05-17 MED ORDER — HEPARIN SODIUM (PORCINE) 5000 UNIT/ML IJ SOLN
5000.0000 [IU] | Freq: Three times a day (TID) | INTRAMUSCULAR | Status: DC
  Administered 2018-05-17: 5000 [IU] via SUBCUTANEOUS
  Filled 2018-05-17: qty 1

## 2018-05-17 MED ORDER — NITROGLYCERIN IN D5W 200-5 MCG/ML-% IV SOLN: 50.0000 ug/min | Freq: Once | INTRAVENOUS | Status: DC

## 2018-05-17 MED ORDER — DARBEPOETIN ALFA 60 MCG/0.3ML IJ SOSY
60.0000 ug | PREFILLED_SYRINGE | INTRAMUSCULAR | Status: DC
  Filled 2018-05-17 (×2): qty 0.3

## 2018-05-17 MED ORDER — NICOTINE 21 MG/24HR TD PT24
21.0000 mg | MEDICATED_PATCH | Freq: Every day | TRANSDERMAL | Status: DC
  Administered 2018-05-17: 21 mg via TRANSDERMAL
  Filled 2018-05-17: qty 1

## 2018-05-17 MED ORDER — ZOLPIDEM TARTRATE 5 MG PO TABS: 5.0000 mg | ORAL_TABLET | Freq: Every evening | ORAL | Status: DC | PRN

## 2018-05-17 MED ORDER — SENNOSIDES-DOCUSATE SODIUM 8.6-50 MG PO TABS: 1.0000 | ORAL_TABLET | Freq: Every evening | ORAL | Status: DC | PRN

## 2018-05-17 MED ORDER — ACETAMINOPHEN 650 MG RE SUPP: 650.0000 mg | Freq: Four times a day (QID) | RECTAL | Status: DC | PRN

## 2018-05-17 MED ORDER — CHLORHEXIDINE GLUCONATE CLOTH 2 % EX PADS: 6.0000 | MEDICATED_PAD | Freq: Every day | CUTANEOUS | Status: DC

## 2018-05-17 MED ORDER — LORAZEPAM 2 MG/ML IJ SOLN: 0.0000 mg | Freq: Two times a day (BID) | INTRAMUSCULAR | Status: DC

## 2018-05-17 MED ORDER — ACETAMINOPHEN 325 MG PO TABS: 650.0000 mg | ORAL_TABLET | Freq: Four times a day (QID) | ORAL | Status: DC | PRN

## 2018-05-17 MED ORDER — ONDANSETRON HCL 4 MG/2ML IJ SOLN: 4.0000 mg | Freq: Four times a day (QID) | INTRAMUSCULAR | Status: DC | PRN

## 2018-05-17 MED ORDER — AMLODIPINE BESYLATE 10 MG PO TABS
10.0000 mg | ORAL_TABLET | Freq: Every day | ORAL | Status: DC
  Filled 2018-05-17: qty 1

## 2018-05-17 MED ORDER — LORAZEPAM 2 MG/ML IJ SOLN: 1.0000 mg | Freq: Four times a day (QID) | INTRAMUSCULAR | Status: DC | PRN

## 2018-05-17 MED ORDER — NITROGLYCERIN IN D5W 200-5 MCG/ML-% IV SOLN
50.0000 ug/min | Freq: Once | INTRAVENOUS | Status: AC
  Administered 2018-05-17: 50 ug/min via INTRAVENOUS
  Filled 2018-05-17: qty 250

## 2018-05-17 MED ORDER — FOLIC ACID 1 MG PO TABS
1.0000 mg | ORAL_TABLET | Freq: Every day | ORAL | Status: DC
  Administered 2018-05-17: 1 mg via ORAL
  Filled 2018-05-17: qty 1

## 2018-05-17 MED ORDER — LEVALBUTEROL HCL 1.25 MG/0.5ML IN NEBU
1.2500 mg | INHALATION_SOLUTION | Freq: Four times a day (QID) | RESPIRATORY_TRACT | Status: DC
  Administered 2018-05-17: 1.25 mg via RESPIRATORY_TRACT
  Filled 2018-05-17: qty 0.5

## 2018-05-17 MED ORDER — SEVELAMER CARBONATE 800 MG PO TABS
800.0000 mg | ORAL_TABLET | Freq: Two times a day (BID) | ORAL | Status: DC
  Administered 2018-05-17 (×2): 800 mg via ORAL
  Filled 2018-05-17 (×2): qty 1

## 2018-05-17 MED ORDER — HYDROXYZINE HCL 25 MG PO TABS: 50.0000 mg | ORAL_TABLET | ORAL | Status: DC | PRN

## 2018-05-17 MED ORDER — DARBEPOETIN ALFA 60 MCG/0.3ML IJ SOSY: 60.0000 ug | PREFILLED_SYRINGE | INTRAMUSCULAR | Status: DC

## 2018-05-17 MED ORDER — LORAZEPAM 2 MG/ML IJ SOLN: 0.0000 mg | Freq: Four times a day (QID) | INTRAMUSCULAR | Status: DC

## 2018-05-17 MED ORDER — ONDANSETRON HCL 4 MG PO TABS: 4.0000 mg | ORAL_TABLET | Freq: Four times a day (QID) | ORAL | Status: DC | PRN

## 2018-05-17 MED ORDER — ASPIRIN EC 325 MG PO TBEC
325.0000 mg | DELAYED_RELEASE_TABLET | Freq: Every day | ORAL | Status: DC
  Administered 2018-05-17: 325 mg via ORAL
  Filled 2018-05-17: qty 1

## 2018-05-17 MED ORDER — VITAMIN B-1 100 MG PO TABS
100.0000 mg | ORAL_TABLET | Freq: Every day | ORAL | Status: DC
  Administered 2018-05-17: 100 mg via ORAL
  Filled 2018-05-17: qty 1

## 2018-05-17 MED ORDER — AMLODIPINE BESYLATE 10 MG PO TABS: 10.0000 mg | ORAL_TABLET | Freq: Every day | ORAL | 0 refills | Status: DC

## 2018-05-17 MED ORDER — LORAZEPAM 1 MG PO TABS: 1.0000 mg | ORAL_TABLET | Freq: Four times a day (QID) | ORAL | Status: DC | PRN

## 2018-05-17 MED ORDER — THIAMINE HCL 100 MG/ML IJ SOLN: 100.0000 mg | Freq: Every day | INTRAMUSCULAR | Status: DC

## 2018-05-17 MED ORDER — DM-GUAIFENESIN ER 30-600 MG PO TB12
1.0000 | ORAL_TABLET | Freq: Two times a day (BID) | ORAL | Status: DC | PRN
  Filled 2018-05-17: qty 1

## 2018-05-17 NOTE — ED Provider Notes (Addendum)
[facility] EMERGENCY DEPARTMENT Provider Note   CSN: [id number] Arrival date & time: 05/17/18  4403     History   Chief Complaint Chief Complaint  Patient presents with  . Respiratory Distress    HPI Jordan [name] is a 52 y.o. male.  HPI  52 year old male with history of hypertension, CKD comes in with chief complaint of respiratory distress.  Patient gets his dialysis Monday, Wednesday, Friday in [place].  According to patient he started getting shortness of breath during the daytime yesterday.  Is a day progressed his symptoms gradually got worse.  Patient has orthopnea and paroxysmal nocturnal dyspnea-like symptoms.  Finally patients are having severe respiratory distress and called EMS.  According to EMS, when they arrived patient's O2 sats were in the 80s.  They put him on BiPAP.  He was wheezing.  Patient has no history of lung disease and he denies any cough, fevers, chills.  Patient is up-to-date with his dialysis and has taken all his medications as prescribed.  Past Medical History:  Diagnosis Date  . Anemia   . CKD (chronic kidney disease)    Stage 5  Dialysis - M/W/F  . ETOH abuse   . Hypertension     Patient Active Problem List   Diagnosis Date Noted  . Hepatitis B 03/19/2018  . Encephalopathy 03/19/2018  . 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 History of macrocytic anemia 03/19/2018  . History of iron deficiency anemia 03/19/2018  . ESRD (end stage renal disease) ([facility] [place]) 05/26/2017  . Essential hypertension 05/26/2017  . Gastritis with hemorrhage 05/26/2017  . Tobacco abuse 08/04/2016  . ETOH abuse 08/04/2016  . Uncontrolled hypertension 08/04/2016  . Acute renal failure (ARF) ([facility]) 08/04/2016  . Symptomatic anemia 08/04/2016  . Hypertensive emergency     Past Surgical History:  Procedure Laterality Date  . BASCILIC VEIN TRANSPOSITION Left 08/07/2016   Procedure: LEFT BASILIC VEIN TRANSPOSITION;  Surgeon:  Angelia Mould, MD;  Location: [facility];  Service: Vascular;  Laterality: Left;  . COLONOSCOPY N/A 08/12/2016   Procedure: COLONOSCOPY;  Surgeon: Otis Brace, MD;  Location: [facility];  Service: Gastroenterology;  Laterality: N/A;  . ESOPHAGOGASTRODUODENOSCOPY N/A 08/12/2016   Procedure: ESOPHAGOGASTRODUODENOSCOPY (EGD);  Surgeon: Otis Brace, MD;  Location: [facility];  Service: Gastroenterology;  Laterality: N/A;  . INSERTION OF DIALYSIS CATHETER N/A 08/07/2016   Procedure: INSERTION OF TUNNELED DIALYSIS CATHETER;  Surgeon: Angelia Mould, MD;  Location: [facility];  Service: Vascular;  Laterality: N/A;  . LIGATION OF ARTERIOVENOUS  FISTULA Left 09/12/2016   Procedure: BANDING OF LEFT  ARTERIOVENOUS  FISTULA;  Surgeon: Angelia Mould, MD;  Location: [facility];  Service: Vascular;  Laterality: Left;  . NO PAST SURGERIES          Home Medications    Prior to Admission medications   Medication Sig Start Date End Date Taking? Authorizing Provider  folic acid (FOLVITE) 1 MG tablet Take 1 tablet (1 mg total) by mouth daily. 03/19/18   Alphonzo Grieve, MD  hydrOXYzine (ATARAX/VISTARIL) 50 MG tablet Take 50 mg by mouth as needed. 12/25/17   [provider]  nicotine (NICODERM CQ - DOSED IN MG/24 HR) 7 mg/24hr patch Place 1 patch (7 mg total) onto the skin daily. 03/19/18   Alphonzo Grieve, MD  RENVELA 800 MG tablet Take 800 mg by mouth 2 (two) times daily. 01/06/18   [provider]  thiamine 100 MG tablet Take 1 tablet (100  mg total) by mouth daily. 03/19/18   Alphonzo Grieve, MD    Family History Family History  Problem Relation Age of Onset  . Diabetes Mother   . Kidney failure Mother   . Kidney failure Brother   . Post-traumatic stress disorder Neg Hx   . Bladder Cancer Neg Hx   . Kidney cancer Neg Hx     Social History Social History   Tobacco Use  . Smoking status: Current Every Day Smoker    Packs/day: 0.05    Years: 15.00    Pack  years: 0.75    Types: Cigarettes  . Smokeless tobacco: Never Used  . Tobacco comment: down to 1 a day  Substance Use Topics  . Alcohol use: Yes    Alcohol/week: 21.0 standard drinks    Types: 21 Cans of beer per week    Comment: none since 08/04/16  . Drug use: No     Allergies   Patient has no known allergies.   Review of Systems Review of Systems  Unable to perform ROS: Severe respiratory distress     Physical Exam Updated Vital Signs BP (!) 174/107   Pulse (!) 107   Temp 98.5 F (36.9 C) (Axillary)   Resp (!) 22   Ht 5\' 4"  (1.626 m)   Wt 76.2 kg   SpO2 100%   BMI 28.84 kg/m   Physical Exam  Constitutional: He is oriented to person, place, and time. He appears well-developed.  HENT:  Head: Atraumatic.  Eyes: EOM are normal.  Neck: Neck supple.  Cardiovascular: Regular rhythm.  Tachycardia  Pulmonary/Chest: Effort normal. He has wheezes.  And expiratory wheeze without any rales  Abdominal: Soft. There is no tenderness.  Musculoskeletal: He exhibits no edema.  Neurological: He is alert and oriented to person, place, and time.  Skin: Skin is warm.  Nursing note and vitals reviewed.    ED Treatments / Results  Labs (all labs ordered are listed, but only abnormal results are displayed) Labs Reviewed  CBC WITH DIFFERENTIAL/PLATELET - Abnormal; Notable for the following components:      Result Value   RBC 2.71 (*)    Hemoglobin 9.2 (*)    HCT 28.4 (*)    MCV 104.8 (*)    Eosinophils Absolute 0.8 (*)    All other components within normal limits  BASIC METABOLIC PANEL  TROPONIN I  MAGNESIUM    EKG EKG Interpretation  Date/Time:  Monday May 17 2018 01:00:49 EDT Ventricular Rate:  107 PR Interval:    QRS Duration: 91 QT Interval:  369 QTC Calculation: 493 R Axis:   52 Text Interpretation:  Sinus tachycardia Atrial premature complexes Borderline prolonged QT interval No acute changes Confirmed by Varney Biles ([id number]) on 05/17/2018 1:25:47  AM   Radiology Dg Chest Port 1 View  Result Date: 05/17/2018 CLINICAL DATA:  52 year old male with respiratory distress. EXAM: PORTABLE CHEST 1 VIEW COMPARISON:  Chest radiograph dated 03/19/2018 FINDINGS: Mild cardiomegaly. There is diffuse interstitial prominence most consistent with edema. No pleural effusion or pneumothorax. No acute osseous pathology. IMPRESSION: Cardiomegaly with pulmonary edema. Pneumonia is not excluded. Clinical correlation is recommended. Electronically Signed   By: Anner Crete M.D.   On: 05/17/2018 01:27    Procedures Procedures (including critical care time)  CRITICAL CARE Performed by: Dellamae Rosamilia   Total critical care time: 41 minutes for acute respiratory failure requiring NIPPV support / respiratory support.  Critical care time was exclusive of separately billable procedures and treating  other patients.  Critical care was necessary to treat or prevent imminent or life-threatening deterioration.  Critical care was time spent personally by me on the following activities: development of treatment plan with patient and/or surrogate as well as nursing, discussions with consultants, evaluation of patient's response to treatment, examination of patient, obtaining history from patient or surrogate, ordering and performing treatments and interventions, ordering and review of laboratory studies, ordering and review of radiographic studies, pulse oximetry and re-evaluation of patient's condition.   Medications Ordered in ED Medications  nitroGLYCERIN 50 mg in dextrose 5 % 250 mL (0.2 mg/mL) infusion (190 mcg/min Intravenous Rate/Dose Change 05/17/18 0250)     Initial Impression / Assessment and Plan / ED Course  I have reviewed the triage vital signs and the nursing notes.  Pertinent labs & imaging results that were available during my care of the patient were reviewed by me and considered in my medical decision making (see chart for  details).  Clinical Course as of May 17 254  Mon May 17, 2018  0200 Patient appears comfortable on BiPAP.  Is no longer in respiratory distress. Since patient does not take any antihypertensives, we will start him on nitro drip at this time.  We will map of 90 mmhg  MAP (mmHg): 130 [AN]  0255 Dr. Jonnie Finner, nephrology will see the patient.  He wants the patient to be admitted.   [AN]    Clinical Course User Index [AN] Varney Biles, MD    52 year old male comes in with chief complaint of shortness of breath. Patient has history of end-stage renal disease on HD.  He does not have history of hypertension or lung disease. Patient started having acute shortness of breath tonight, after gradual worsening during the daytime.  He was wheezing all over and was noted to be hypoxic and tripoding for EMS.  Patient is on BiPAP at this time.  He is noted to be hypertensive with maps in the 130-140 range.  Concerns are for flash pulmonary edema.  Based on history and exam, we do not think patient is having pneumonia or COPD exacerbation.  She denies any chest pain and this does not appear to be ACS.    Final Clinical Impressions(s) / ED Diagnoses   Final diagnoses:  Acute respiratory failure with hypoxia ([place])  Acute pulmonary edema [facility])    ED Discharge Orders    None       Varney Biles, MD 05/17/18 [place], [place], MD 05/29/18 [phone]

## 2018-05-17 NOTE — Discharge Summary (Signed)
DISCHARGE SUMMARY  PRESS CASALE  MR#: [id number]  DOB:11/30/1965  Date of Admission: 05/17/2018 Date of Discharge: 05/17/2018  Attending Physician:Jeffrey Hennie Duos, MD  Patient's RWE:[id number], No Pcp Per  Consults:  Nephrology   Disposition: D/C home   Follow-up Appts: Follow-up Information    [place] [place] Follow up.   Why:  Keep your schedule hemodialysis appointments.   Contact information: [place] [address] [phone]          Discharge Diagnoses: Acute hypoxic respiratory failure Pulmonary edema Malignant hypertension - uncontrolled hypertension ESRD Anemia of chronic kidney disease Tobacco abuse Alcohol abuse-  Initial presentation: 51 y.o. male with a hx of hypertension not taking medications, alcohol abuse, tobacco abuse, anemia, and ESRD on HD (MWF) who presented with shortness of breath, orthopnea, and paroxysmal nocturnal dyspnea. ]No chest pain. No fever or chills.   In the ED he was found to have oxygen sats in the 80s and BiPAP was started.  Hospital Course: The patient was admitted to the acute units and Nephrology was consulted.  Nephrology suspected the patient's severe pulmonary edema was related to lean body weight loss with a resultant need to adjust his EDW.  He was given an extra urgent hemodialysis treatment during the hospital.  He tolerated this exceptionally well with his symptoms entirely resolving.  He otherwise was medically stable.  He was therefore cleared for discharge home 05/17/2018.  Nephrology has communicated his new lower EDW to his hemodialysis unit for future treatments.  Allergies as of 05/17/2018   No Known Allergies     Medication List    STOP taking these medications   nicotine 7 mg/24hr patch Commonly known as:  NICODERM CQ - dosed in mg/24 hr     TAKE these medications   acetaminophen 325 MG tablet Commonly known as:  TYLENOL Take 2 tablets (650 mg total) by mouth every 6 (six) hours as  needed for mild pain (or Fever >/= 101).   amLODipine 10 MG tablet Commonly known as:  NORVASC Take 1 tablet (10 mg total) by mouth daily.   calcium acetate 667 MG capsule Commonly known as:  PHOSLO Take 1,334 mg by mouth 3 (three) times daily with meals.   Darbepoetin Alfa 60 MCG/0.3ML Sosy injection Commonly known as:  ARANESP Inject 0.3 mLs (60 mcg total) into the vein every Monday with hemodialysis. Start taking on:  01/27/997   folic acid 1 MG tablet Commonly known as:  FOLVITE Take 1 tablet (1 mg total) by mouth daily.   hydrOXYzine 50 MG tablet Commonly known as:  ATARAX/VISTARIL Take 50 mg by mouth as needed.   RENVELA 800 MG tablet Generic drug:  sevelamer carbonate Take 800 mg by mouth 2 (two) times daily.   thiamine 100 MG tablet Take 1 tablet (100 mg total) by mouth daily.       Day of Discharge BP (!) 146/82 (BP Location: Right Arm)   Pulse (!) 102   Temp 98 F (36.7 C) (Oral)   Resp 18   Ht 5\' 4"  (1.626 m)   Wt 71.2 kg Comment: stood to scale   SpO2 95%   BMI 26.94 kg/m   Physical Exam: General: No acute respiratory distress Lungs: Clear to auscultation bilaterally without wheezes or crackles Cardiovascular: Regular rate and rhythm without murmur gallop or rub normal S1 and S2 Abdomen: Nontender, nondistended, soft, bowel sounds positive, no rebound, no ascites, no appreciable mass Extremities: No significant cyanosis, clubbing, or edema bilateral lower extremities  Basic Metabolic Panel: Recent Labs  Lab 05/17/18 0112 05/17/18 0358  NA 137 136  K 3.8 4.0  CL 94* 98  CO2 24 24  GLUCOSE 111* 114*  BUN 44* 47*  CREATININE 12.84* 13.42*  CALCIUM 8.6* 8.2*  MG 2.3  --    CBC: Recent Labs  Lab 05/17/18 0112 05/17/18 0358  WBC 10.3 9.3  NEUTROABS 7.2  --   HGB 9.2* 7.5*  HCT 28.4* 23.0*  MCV 104.8* 104.1*  PLT 165 143*    Cardiac Enzymes: Recent Labs  Lab 05/17/18 0112 05/17/18 0358 05/17/18 1004  TROPONINI 0.04* 0.05* 0.04*     Time spent in discharge (includes decision making & examination of pt): 30 minutes  05/17/2018, 4:39 PM   Cherene Altes, MD [facility] Office  [phone] Pager [phone]  On-Call/Text Page:      [email or web address]      password [facility]

## 2018-05-17 NOTE — Progress Notes (Signed)
Patient stated he was breathing easier and wanted to remove bipap. Placed patient on 3lpm nasal cannula with Sp02=99% at this time. Will continue to monitor patient.

## 2018-05-17 NOTE — Consult Note (Addendum)
Renal Service Consult Note [facility]  Jordan [name] 05/17/2018 [place] Requesting Physician:  Dr Kathrynn Humble  Reason for Consult:  ESRD pt in resp distress HPI: The patient is a 52 y.o. year-old with hx of HTN, etoh abuse, anemia and ESRD on HD MWF in [place] / [place].  Pt presented to ED this am w/ sudden onset SOB, sweating, and coughing.  No chest pain, prod cough, fevers , chills.   IN ED BP was 187/108, RR 27 and temp wnl.  CXR shows pulm edema. We are asked to see for HD.    Pt comes to his HD appts and stays on the machine.  Has been coming off close to or under his edw of 72.5kg lately.     ROS  denies CP  no joint pain   no HA  no blurry vision  no rash  no diarrhea  no nausea/ vomiting    Past Medical History  Past Medical History:  Diagnosis Date  . Anemia   . CKD (chronic kidney disease)    Stage 5  Dialysis - M/W/F  . ETOH abuse   . Hypertension    Past Surgical History  Past Surgical History:  Procedure Laterality Date  . BASCILIC VEIN TRANSPOSITION Left 08/07/2016   Procedure: LEFT BASILIC VEIN TRANSPOSITION;  Surgeon: Angelia Mould, MD;  Location: [place];  Service: Vascular;  Laterality: Left;  . COLONOSCOPY N/A 08/12/2016   Procedure: COLONOSCOPY;  Surgeon: Otis Brace, MD;  Location: [place];  Service: Gastroenterology;  Laterality: N/A;  . ESOPHAGOGASTRODUODENOSCOPY N/A 08/12/2016   Procedure: ESOPHAGOGASTRODUODENOSCOPY (EGD);  Surgeon: Otis Brace, MD;  Location: [place];  Service: Gastroenterology;  Laterality: N/A;  . INSERTION OF DIALYSIS CATHETER N/A 08/07/2016   Procedure: INSERTION OF TUNNELED DIALYSIS CATHETER;  Surgeon: Angelia Mould, MD;  Location: [place];  Service: Vascular;  Laterality: N/A;  . LIGATION OF ARTERIOVENOUS  FISTULA Left 09/12/2016   Procedure: BANDING OF LEFT  ARTERIOVENOUS  FISTULA;  Surgeon: Angelia Mould, MD;  Location: [facility] OR;  Service: Vascular;   Laterality: Left;  . NO PAST SURGERIES     Family History  Family History  Problem Relation Age of Onset  . Diabetes Mother   . Kidney failure Mother   . Kidney failure Brother   . Post-traumatic stress disorder Neg Hx   . Bladder Cancer Neg Hx   . Kidney cancer Neg Hx    Social History  reports that he has been smoking cigarettes. He has a 0.75 pack-year smoking history. He has never used smokeless tobacco. He reports that he drinks about 21.0 standard drinks of alcohol per week. He reports that he does not use drugs. Allergies No Known Allergies Home medications Prior to Admission medications   Medication Sig Start Date End Date Taking? Authorizing Provider  folic acid (FOLVITE) 1 MG tablet Take 1 tablet (1 mg total) by mouth daily. 03/19/18   Alphonzo Grieve, MD  hydrOXYzine (ATARAX/VISTARIL) 50 MG tablet Take 50 mg by mouth as needed. 12/25/17   [provider]  nicotine (NICODERM CQ - DOSED IN MG/24 HR) 7 mg/24hr patch Place 1 patch (7 mg total) onto the skin daily. 03/19/18   Alphonzo Grieve, MD  RENVELA 800 MG tablet Take 800 mg by mouth 2 (two) times daily. 01/06/18   [provider]  thiamine 100 MG tablet Take 1 tablet (100 mg total) by mouth daily. 03/19/18   Alphonzo Grieve, MD   Liver Function Tests  No results for input(s): AST, ALT, ALKPHOS, BILITOT, PROT, ALBUMIN in the last 168 hours. No results for input(s): LIPASE, AMYLASE in the last 168 hours. CBC Recent Labs  Lab 05/17/18 0112  WBC 10.3  NEUTROABS 7.2  HGB 9.2*  HCT 28.4*  MCV 104.8*  PLT 397   Basic Metabolic Panel Recent Labs  Lab 05/17/18 0112  NA 137  K 3.8  CL 94*  CO2 24  GLUCOSE 111*  BUN 44*  CREATININE 12.84*  CALCIUM 8.6*   Iron/TIBC/Ferritin/ %Sat    Component Value Date/Time   IRON 29 (L) 08/04/2016 0543   TIBC 423 08/04/2016 0543   FERRITIN 16 (L) 08/04/2016 0543   IRONPCTSAT 7 (L) 08/04/2016 0543    Vitals:   05/17/18 0242 05/17/18 0245 05/17/18 0248 05/17/18  0251  BP: (!) 158/106 (!) 165/107 (!) 169/103 (!) 174/107  Pulse:    (!) 107  Resp: 20 (!) 21 16 (!) 22  Temp:      TempSrc:      SpO2:    100%  Weight:      Height:       Exam Gen alert, on bipap, no distress No rash, cyanosis or gangrene Sclera anicteric, throat not seen  No jvd or bruits Chest bilat basilar rales 1/3 up RRR no MRG Abd soft ntnd no mass or ascites +bs GU normal male MS no joint effusions or deformity Ext no LE edema, no wounds or ulcers Neuro is alert, Ox 3 , nf LUA AVF+bruit    Home meds:  - renvela 800 bid/ thiamine 100/ nicotine patch/ folic acid  Dialysis: MWF [place]  4h  450/800   72.5kg (coming in under)   LUA aVF   Hep 2000  - calc 2 ug tiw  - etecalcetide 5ug IV tiw  - mircera 75 ug q 2wks to start on 8/27 Last Hb 8.6 and last tsat 49%    Impression: 1. Resp distress/ pulm edema - suspect pulm edema due to lean body wt loss in HD patient.  On Bipap and looks better per ED team.  Plan on HD asap this am.  May need lower dry wt, has been coming in right at or slightly under dry wt.  Wean bipap on HD as tolerated.  2. ESRD HD MWF x 4 yrs 3. HTN - not on any BP medications at home. On IV NTG now 4. MBD - cont meds 5. Anemia ckd - tsat 49%, Hb down 8.6, was supposed to start ^'d dose mircera today, plan darbe 60 ug on HD   Plan - as above  Kelly Splinter MD [employer] pager [phone]   05/17/2018, 3:12 AM

## 2018-05-17 NOTE — H&P (Signed)
History and Physical    Jordan [name] ULA:[id number] DOB: [birth year] DOA: 05/17/2018  Referring MD/NP/PA:   PCP: Patient, No Pcp Per   Patient coming from:  The patient is coming from home.  At baseline, pt is independent for most of ADL.   Chief Complaint: SOB  HPI: Jordan [name] is a 52 y.o. male with medical history significant of hypertension not taking medications, alcohol abuse, tobacco abuse, anemia, ESRD-HD (MWF), who presents with shortness of breath  Patient states that he has been having shortness of breath since yesterday, which has been progressively getting worse. He has orthopnea and paroxysmal nocturnal dyspnea. He has cough with grass colored mucus production. No chest pain. No fever or chills. He was found to have oxygen desaturated to 80s. Patient denies nausea or vomiting, diarrhea or abdominal pain. No symptoms of UTI or unilateral weakness. Patient states that he had full course of dialysis on Friday. Patient cannot speak in full sentence. BiPAP was started in the ED.  ED Course: pt was found to have elevated blood pressure 187/108, tachycardia, tachypnea,temperature normal. Troponin 0.04, WBC 10.3, potassium 3.8, bicarbonate 24, creatinine 12.84, BUN 44, chest x-ray showed cardiomegaly and pulmonary edema. Patient is placed on stepdown bed for observation. Renal, Dr. Jonnie Finner was consulted for urgernt dialysis.   Review of Systems:   General: no fevers, chills, has fatigue HEENT: no blurry vision, hearing changes or sore throat Respiratory: has dyspnea, coughing, no wheezing CV: no chest pain, no palpitations GI: no nausea, vomiting, abdominal pain, diarrhea, constipation GU: no dysuria, burning on urination, increased urinary frequency, hematuria  Ext: no leg edema Neuro: no unilateral weakness, numbness, or tingling, no vision change or hearing loss Skin: no rash, no skin tear. MSK: No muscle spasm, no deformity, no limitation of range of movement in  spin Heme: No easy bruising.  Travel history: No recent long distant travel.  Allergy: No Known Allergies  Past Medical History:  Diagnosis Date  . Anemia   . CKD (chronic kidney disease)    Stage 5  Dialysis - M/W/F  . ETOH abuse   . Hypertension     Past Surgical History:  Procedure Laterality Date  . BASCILIC VEIN TRANSPOSITION Left 08/07/2016   Procedure: LEFT BASILIC VEIN TRANSPOSITION;  Surgeon: Angelia Mould, MD;  Location: [place];  Service: Vascular;  Laterality: Left;  . COLONOSCOPY N/A 08/12/2016   Procedure: COLONOSCOPY;  Surgeon: Otis Brace, MD;  Location: [place];  Service: Gastroenterology;  Laterality: N/A;  . ESOPHAGOGASTRODUODENOSCOPY N/A 08/12/2016   Procedure: ESOPHAGOGASTRODUODENOSCOPY (EGD);  Surgeon: Otis Brace, MD;  Location: [place];  Service: Gastroenterology;  Laterality: N/A;  . INSERTION OF DIALYSIS CATHETER N/A 08/07/2016   Procedure: INSERTION OF TUNNELED DIALYSIS CATHETER;  Surgeon: Angelia Mould, MD;  Location: [place];  Service: Vascular;  Laterality: N/A;  . LIGATION OF ARTERIOVENOUS  FISTULA Left 09/12/2016   Procedure: BANDING OF LEFT  ARTERIOVENOUS  FISTULA;  Surgeon: Angelia Mould, MD;  Location: [place];  Service: Vascular;  Laterality: Left;  . NO PAST SURGERIES      Social History:  reports that he has been smoking cigarettes. He has a 0.75 pack-year smoking history. He has never used smokeless tobacco. He reports that he drinks about 21.0 standard drinks of alcohol per week. He reports that he does not use drugs.  Family History:  Family History  Problem Relation Age of Onset  . Diabetes Mother   . Kidney failure Mother   .  Kidney failure Brother   . Post-traumatic stress disorder Neg Hx   . Bladder Cancer Neg Hx   . Kidney cancer Neg Hx      Prior to Admission medications   Medication Sig Start Date End Date Taking? Authorizing Provider  folic acid (FOLVITE) 1 MG tablet Take 1 tablet (1 mg  total) by mouth daily. 03/19/18   Alphonzo Grieve, MD  hydrOXYzine (ATARAX/VISTARIL) 50 MG tablet Take 50 mg by mouth as needed. 12/25/17   [provider]  nicotine (NICODERM CQ - DOSED IN MG/24 HR) 7 mg/24hr patch Place 1 patch (7 mg total) onto the skin daily. 03/19/18   Alphonzo Grieve, MD  RENVELA 800 MG tablet Take 800 mg by mouth 2 (two) times daily. 01/06/18   [provider]  thiamine 100 MG tablet Take 1 tablet (100 mg total) by mouth daily. 03/19/18   Alphonzo Grieve, MD    Physical Exam: Vitals:   05/17/18 0248 05/17/18 0251 05/17/18 0325 05/17/18 0330  BP: (!) 169/103 (!) 174/107 (!) 164/106 (!) 167/104  Pulse:  (!) 107    Resp: 16 (!) 22 (!) 22 17  Temp:      TempSrc:      SpO2:  100%    Weight:      Height:       General: Not in acute distress HEENT:       Eyes: PERRL, EOMI, no scleral icterus.       ENT: No discharge from the ears and nose, no pharynx injection, no tonsillar enlargement.        Neck: No JVD, no bruit, no mass felt. Heme: No neck lymph node enlargement. Cardiac: S1/S2, RRR, No murmurs, No gallops or rubs. Respiratory: Has fine crackles bilaterally. GI: Soft, nondistended, nontender, no rebound pain, no organomegaly, BS present. GU: No hematuria Ext: No pitting leg edema bilaterally. 2+DP/PT pulse bilaterally. Musculoskeletal: No joint deformities, No joint redness or warmth, no limitation of ROM in spin. Skin: No rashes.  Neuro: Alert, oriented X3, cranial nerves II-XII grossly intact, moves all extremities normally. Psych: Patient is not psychotic, no suicidal or hemocidal ideation.  Labs on Admission: I have personally reviewed following labs and imaging studies  CBC: Recent Labs  Lab 05/17/18 0112  WBC 10.3  NEUTROABS 7.2  HGB 9.2*  HCT 28.4*  MCV 104.8*  PLT 485   Basic Metabolic Panel: Recent Labs  Lab 05/17/18 0112  NA 137  K 3.8  CL 94*  CO2 24  GLUCOSE 111*  BUN 44*  CREATININE 12.84*  CALCIUM 8.6*  MG 2.3    GFR: Estimated Creatinine Clearance: 6.4 mL/min (A) (by C-G formula based on SCr of 12.84 mg/dL (H)). Liver Function Tests: No results for input(s): AST, ALT, ALKPHOS, BILITOT, PROT, ALBUMIN in the last 168 hours. No results for input(s): LIPASE, AMYLASE in the last 168 hours. No results for input(s): AMMONIA in the last 168 hours. Coagulation Profile: No results for input(s): INR, PROTIME in the last 168 hours. Cardiac Enzymes: Recent Labs  Lab 05/17/18 0112  TROPONINI 0.04*   BNP (last 3 results) No results for input(s): PROBNP in the last 8760 hours. HbA1C: No results for input(s): HGBA1C in the last 72 hours. CBG: No results for input(s): GLUCAP in the last 168 hours. Lipid Profile: No results for input(s): CHOL, HDL, LDLCALC, TRIG, CHOLHDL, LDLDIRECT in the last 72 hours. Thyroid Function Tests: No results for input(s): TSH, T4TOTAL, FREET4, T3FREE, THYROIDAB in the last 72 hours. Anemia Panel:  No results for input(s): VITAMINB12, FOLATE, FERRITIN, TIBC, IRON, RETICCTPCT in the last 72 hours. Urine analysis:    Component Value Date/Time   COLORURINE STRAW (A) 08/04/2016 0448   APPEARANCEUR CLOUDY (A) 08/04/2016 0448   LABSPEC 1.011 08/04/2016 0448   PHURINE 5.5 08/04/2016 0448   GLUCOSEU 100 (A) 08/04/2016 0448   HGBUR SMALL (A) 08/04/2016 0448   BILIRUBINUR NEGATIVE 08/04/2016 0448   KETONESUR NEGATIVE 08/04/2016 0448   PROTEINUR >300 (A) 08/04/2016 0448   NITRITE NEGATIVE 08/04/2016 0448   LEUKOCYTESUR NEGATIVE 08/04/2016 0448   Sepsis Labs: @LABRCNTIP (procalcitonin:4,lacticidven:4) )No results found for this or any previous visit (from the past 240 hour(s)).   Radiological Exams on Admission: Dg Chest Port 1 View  Result Date: 05/17/2018 CLINICAL DATA:  52 year old male with respiratory distress. EXAM: PORTABLE CHEST 1 VIEW COMPARISON:  Chest radiograph dated 03/19/2018 FINDINGS: Mild cardiomegaly. There is diffuse interstitial prominence most consistent  with edema. No pleural effusion or pneumothorax. No acute osseous pathology. IMPRESSION: Cardiomegaly with pulmonary edema. Pneumonia is not excluded. Clinical correlation is recommended. Electronically Signed   By: Anner Crete M.D.   On: 05/17/2018 01:27     EKG: Independently reviewed.  Sinus rhythm, tachycardia, QTC 493, LAE, nonspecific T-wave change.  Assessment/Plan Principal Problem:   Acute respiratory failure with hypoxia (HCC) Active Problems:   Tobacco abuse   ETOH abuse   Hypertensive emergency   ESRD on dialysis [facility])   Essential hypertension   Elevated troponin   Pulmonary edema   Anemia in ESRD (end-stage renal disease) ([place])   Acute respiratory failure with hypoxia due to pulmonary edema: this is likely secondary to hypertensive emergency and blood pressure medication noncompliance. Dr. Jonnie Finner of renal was consulted and hemodialysis urgently.  -will place on tele bed for obs -on BiPAP -urgent dialysis per Dr. Jonnie Finner  ESRD on dialysis (MWF): -HD per renal  Hypertensive emergency and hx of HTN: patient is not taking any medications at home. -on nitro gtt now -start amlodipine 10 mg daily -IV hydralazine when necessary  Tobacco abuse and Alcohol abuse: -Nicotine patch -CIWA protocol  Elevated troponin: trop 0.04. No CP. Likely due to demand ischemia secondary to pulmonary edema and hypertensive emergency. Decreased clearance in the setting of ESRD may have also contributed partially. -Troponin 3 - check A1c, FLP, UDS -start ASA -prn morphine if developes CP  Anemia due to Esrd: hgb dropped drom 11.2 on 03/19/18 to 9.2 today. -plan darbe 60 ug on HD per Dr. Jonnie Finner   DVT ppx: SQ Heparin     Code Status: Full code Family Communication: None at bed side. Disposition Plan:  Anticipate discharge back to previous home environment Consults called:  Dr. Jonnie Finner of renal Admission status:    SDU/obs     Date of Service 05/17/2018    Ivor Costa [facility] Pager [phone]  If 7PM-7AM, please contact night-coverage [email or web address] Password [email or web address] 05/17/2018, 4:11 AM

## 2018-05-17 NOTE — Discharge Instructions (Signed)
Pulmonary Edema Pulmonary edema is abnormal fluid buildup in the lungs that can make it hard to breathe. Follow these instructions at home:  Talk to your doctor about an exercise program.  Eat a healthy diet: ? Eat fresh fruits, vegetables, and lean meats. ? Limit high fat and salty foods. ? Avoid processed, canned, or fried foods. ? Avoid fast food.  Follow your doctor's advice about taking medicine and recording the medicine you take.  Follow your doctor's advice about keeping a record of your weight.  Talk to your doctor about keeping track of your blood pressure.  Do not smoke.  Do not use nicotine patches or nicotine gum.  Make a follow-up appointment with your doctor.  Ask your doctor for a copy of your latest heart tracing (ECG) and keep a copy with you at all times. Get help right away if:  You have chest pain. THIS IS AN EMERGENCY. Do not wait to see if the pain will go away. Call for local emergency medical help. Do not drive yourself to the hospital.  You have sweating, feel sick to your stomach (nauseous), or are experiencing shortness of breath.  Your weight increases more than your doctor tells you it should.  You start to have shortness of breath.  You notice more swelling in your hands, feet, ankles, or belly.  You have dizziness, blurred vision, headache, or unsteadiness that does not go away.  You cough up bloody spit.  You have a cough that does not go away.  You are unable to sleep because it is hard to breathe.  You begin to feel a jumping or fluttering sensation (palpitations) in the chest that is unusual for you. This information is not intended to replace advice given to you by your health care provider. Make sure you discuss any questions you have with your health care provider. Document Released: 08/27/2009 Document Revised: 02/14/2016 Document Reviewed: 05/16/2013 Elsevier Interactive Patient Education  [employer].

## 2018-05-17 NOTE — Procedures (Signed)
I saw the patient during dialysis.  Near the end of his treatment he is now off bipap, breathing comfortably laying at 20 degrees in bed.  Feels back to normal.  After 3.5L of UF he had mild cramps, BP 166/101.   Post weight today is 71.2kg.    OK with DC today from nephrology perspective.  I communicated new EDW with home HD unit RN -- recommended 71kg.     Reminded him to be very careful with na/fluid restriction prior to next treatment.

## 2018-05-17 NOTE — ED Triage Notes (Addendum)
Patient BIB [facility] for respiratory distress. Per EMS patient is a dialysis patient (MWF); reports increasing SOB throughout the day, worse when laying down/sitting. Upon EMS arrival O2 sats 80% on RA started on CPAP 100%. Given 10 albuterol, 0.5 Atrovent, and 2 NTG. Patient denies chest pain, n/v, or abdominal pain. Patient A&O x 4.   BP 224/104 HR 112

## 2018-05-17 NOTE — Progress Notes (Signed)
RT transported pt to dialysis without any events. Report given to RT.

## 2018-07-18 ENCOUNTER — Emergency Department (HOSPITAL_COMMUNITY): Payer: Medicare Other

## 2018-07-18 ENCOUNTER — Encounter (HOSPITAL_COMMUNITY): Payer: Self-pay | Admitting: Emergency Medicine

## 2018-07-18 ENCOUNTER — Other Ambulatory Visit: Payer: Self-pay

## 2018-07-18 ENCOUNTER — Inpatient Hospital Stay (HOSPITAL_COMMUNITY)
Admission: EM | Admit: 2018-07-18 | Discharge: 2018-07-19 | DRG: 189 | Disposition: A | Discharge status: Home / Self Care | Payer: Medicare Other | Attending: Internal Medicine | Admitting: Internal Medicine

## 2018-07-18 DIAGNOSIS — I120 Hypertensive chronic kidney disease with stage 5 chronic kidney disease or end stage renal disease: Secondary | ICD-10-CM | POA: Diagnosis not present

## 2018-07-18 DIAGNOSIS — F17210 Nicotine dependence, cigarettes, uncomplicated: Secondary | ICD-10-CM | POA: Diagnosis not present

## 2018-07-18 DIAGNOSIS — Z79899 Other long term (current) drug therapy: Secondary | ICD-10-CM | POA: Diagnosis not present

## 2018-07-18 DIAGNOSIS — Z720 Tobacco use: Secondary | ICD-10-CM

## 2018-07-18 DIAGNOSIS — Z9115 Patient's noncompliance with renal dialysis: Secondary | ICD-10-CM

## 2018-07-18 DIAGNOSIS — D631 Anemia in chronic kidney disease: Secondary | ICD-10-CM | POA: Diagnosis not present

## 2018-07-18 DIAGNOSIS — Z992 Dependence on renal dialysis: Secondary | ICD-10-CM

## 2018-07-18 DIAGNOSIS — L299 Pruritus, unspecified: Secondary | ICD-10-CM | POA: Diagnosis not present

## 2018-07-18 DIAGNOSIS — E8770 Fluid overload, unspecified: Secondary | ICD-10-CM | POA: Diagnosis not present

## 2018-07-18 DIAGNOSIS — Z841 Family history of disorders of kidney and ureter: Secondary | ICD-10-CM

## 2018-07-18 DIAGNOSIS — R0602 Shortness of breath: Secondary | ICD-10-CM | POA: Diagnosis present

## 2018-07-18 DIAGNOSIS — E8889 Other specified metabolic disorders: Secondary | ICD-10-CM | POA: Diagnosis present

## 2018-07-18 DIAGNOSIS — N2581 Secondary hyperparathyroidism of renal origin: Secondary | ICD-10-CM | POA: Diagnosis not present

## 2018-07-18 DIAGNOSIS — N186 End stage renal disease: Secondary | ICD-10-CM | POA: Diagnosis present

## 2018-07-18 DIAGNOSIS — J810 Acute pulmonary edema: Secondary | ICD-10-CM | POA: Diagnosis not present

## 2018-07-18 DIAGNOSIS — R0603 Acute respiratory distress: Secondary | ICD-10-CM | POA: Diagnosis not present

## 2018-07-18 DIAGNOSIS — R0902 Hypoxemia: Secondary | ICD-10-CM | POA: Diagnosis present

## 2018-07-18 DIAGNOSIS — N19 Unspecified kidney failure: Secondary | ICD-10-CM

## 2018-07-18 DIAGNOSIS — F1010 Alcohol abuse, uncomplicated: Secondary | ICD-10-CM | POA: Diagnosis not present

## 2018-07-18 DIAGNOSIS — J811 Chronic pulmonary edema: Secondary | ICD-10-CM | POA: Diagnosis present

## 2018-07-18 DIAGNOSIS — I10 Essential (primary) hypertension: Secondary | ICD-10-CM

## 2018-07-18 LAB — I-STAT CHEM 8, ED
BUN: 77 mg/dL — ABNORMAL HIGH (ref 6–20)
Calcium, Ion: 1.11 mmol/L — ABNORMAL LOW (ref 1.15–1.40)
Chloride: 100 mmol/L (ref 98–111)
Creatinine, Ser: >18 mg/dL — ABNORMAL HIGH (ref 0.61–1.24)
Glucose, Bld: 94 mg/dL (ref 70–99)
HCT: 28 % — ABNORMAL LOW (ref 39.0–52.0)
Hemoglobin: 9.5 g/dL — ABNORMAL LOW (ref 13.0–17.0)
Potassium: 5 mmol/L (ref 3.5–5.1)
Sodium: 138 mmol/L (ref 135–145)
TCO2: 25 mmol/L (ref 22–32)

## 2018-07-18 LAB — CBC WITH DIFFERENTIAL/PLATELET
Abs Immature Granulocytes: 0.03 10*3/uL (ref 0.00–0.07)
Basophils Absolute: 0.1 10*3/uL (ref 0.0–0.1)
Basophils Relative: 1 %
Eosinophils Absolute: 0.8 10*3/uL — ABNORMAL HIGH (ref 0.0–0.5)
Eosinophils Relative: 8 %
HCT: 29.5 % — ABNORMAL LOW (ref 39.0–52.0)
Hemoglobin: 9.5 g/dL — ABNORMAL LOW (ref 13.0–17.0)
Immature Granulocytes: 0 %
Lymphocytes Relative: 16 %
Lymphs Abs: 1.7 10*3/uL (ref 0.7–4.0)
MCH: 32.5 pg (ref 26.0–34.0)
MCHC: 32.2 g/dL (ref 30.0–36.0)
MCV: 101 fL — ABNORMAL HIGH (ref 80.0–100.0)
Monocytes Absolute: 0.7 10*3/uL (ref 0.1–1.0)
Monocytes Relative: 7 %
Neutro Abs: 7 10*3/uL (ref 1.7–7.7)
Neutrophils Relative %: 68 %
Platelets: 169 10*3/uL (ref 150–400)
RBC: 2.92 MIL/uL — ABNORMAL LOW (ref 4.22–5.81)
RDW: 12.9 % (ref 11.5–15.5)
WBC: 10.2 10*3/uL (ref 4.0–10.5)
nRBC: 0 % (ref 0.0–0.2)

## 2018-07-18 LAB — COMPREHENSIVE METABOLIC PANEL
ALT: 11 U/L (ref 0–44)
AST: 16 U/L (ref 15–41)
Albumin: 3.5 g/dL (ref 3.5–5.0)
Alkaline Phosphatase: 55 U/L (ref 38–126)
Anion gap: 17 — ABNORMAL HIGH (ref 5–15)
BUN: 76 mg/dL — ABNORMAL HIGH (ref 6–20)
CO2: 23 mmol/L (ref 22–32)
Calcium: 9.6 mg/dL (ref 8.9–10.3)
Chloride: 100 mmol/L (ref 98–111)
Creatinine, Ser: 19.14 mg/dL — ABNORMAL HIGH (ref 0.61–1.24)
GFR calc Af Amer: 3 mL/min — ABNORMAL LOW (ref >60)
GFR calc non Af Amer: 2 mL/min — ABNORMAL LOW (ref >60)
Glucose, Bld: 93 mg/dL (ref 70–99)
Potassium: 5.1 mmol/L (ref 3.5–5.1)
Sodium: 140 mmol/L (ref 135–145)
Total Bilirubin: 0.9 mg/dL (ref 0.3–1.2)
Total Protein: 7.6 g/dL (ref 6.5–8.1)

## 2018-07-18 LAB — MAGNESIUM: Magnesium: 3 mg/dL — ABNORMAL HIGH (ref 1.7–2.4)

## 2018-07-18 LAB — I-STAT TROPONIN, ED: Troponin i, poc: 0.04 ng/mL (ref 0.00–0.08)

## 2018-07-18 LAB — ETHANOL: Alcohol, Ethyl (B): <10 mg/dL (ref <10)

## 2018-07-18 LAB — PHOSPHORUS: Phosphorus: 6.1 mg/dL — ABNORMAL HIGH (ref 2.5–4.6)

## 2018-07-18 IMAGING — DX DG CHEST 1V PORT
1 series · 1 of 1 positions shown · non-contrast
Comparison: [DATE]

CLINICAL DATA: Itching all over since [REDACTED]. Patient unable to
go to dialysis due to feeling poorly. Swelling beneath the thighs.
Dyspnea and cough today.

EXAM:
PORTABLE CHEST 1 VIEW

[chest ap]
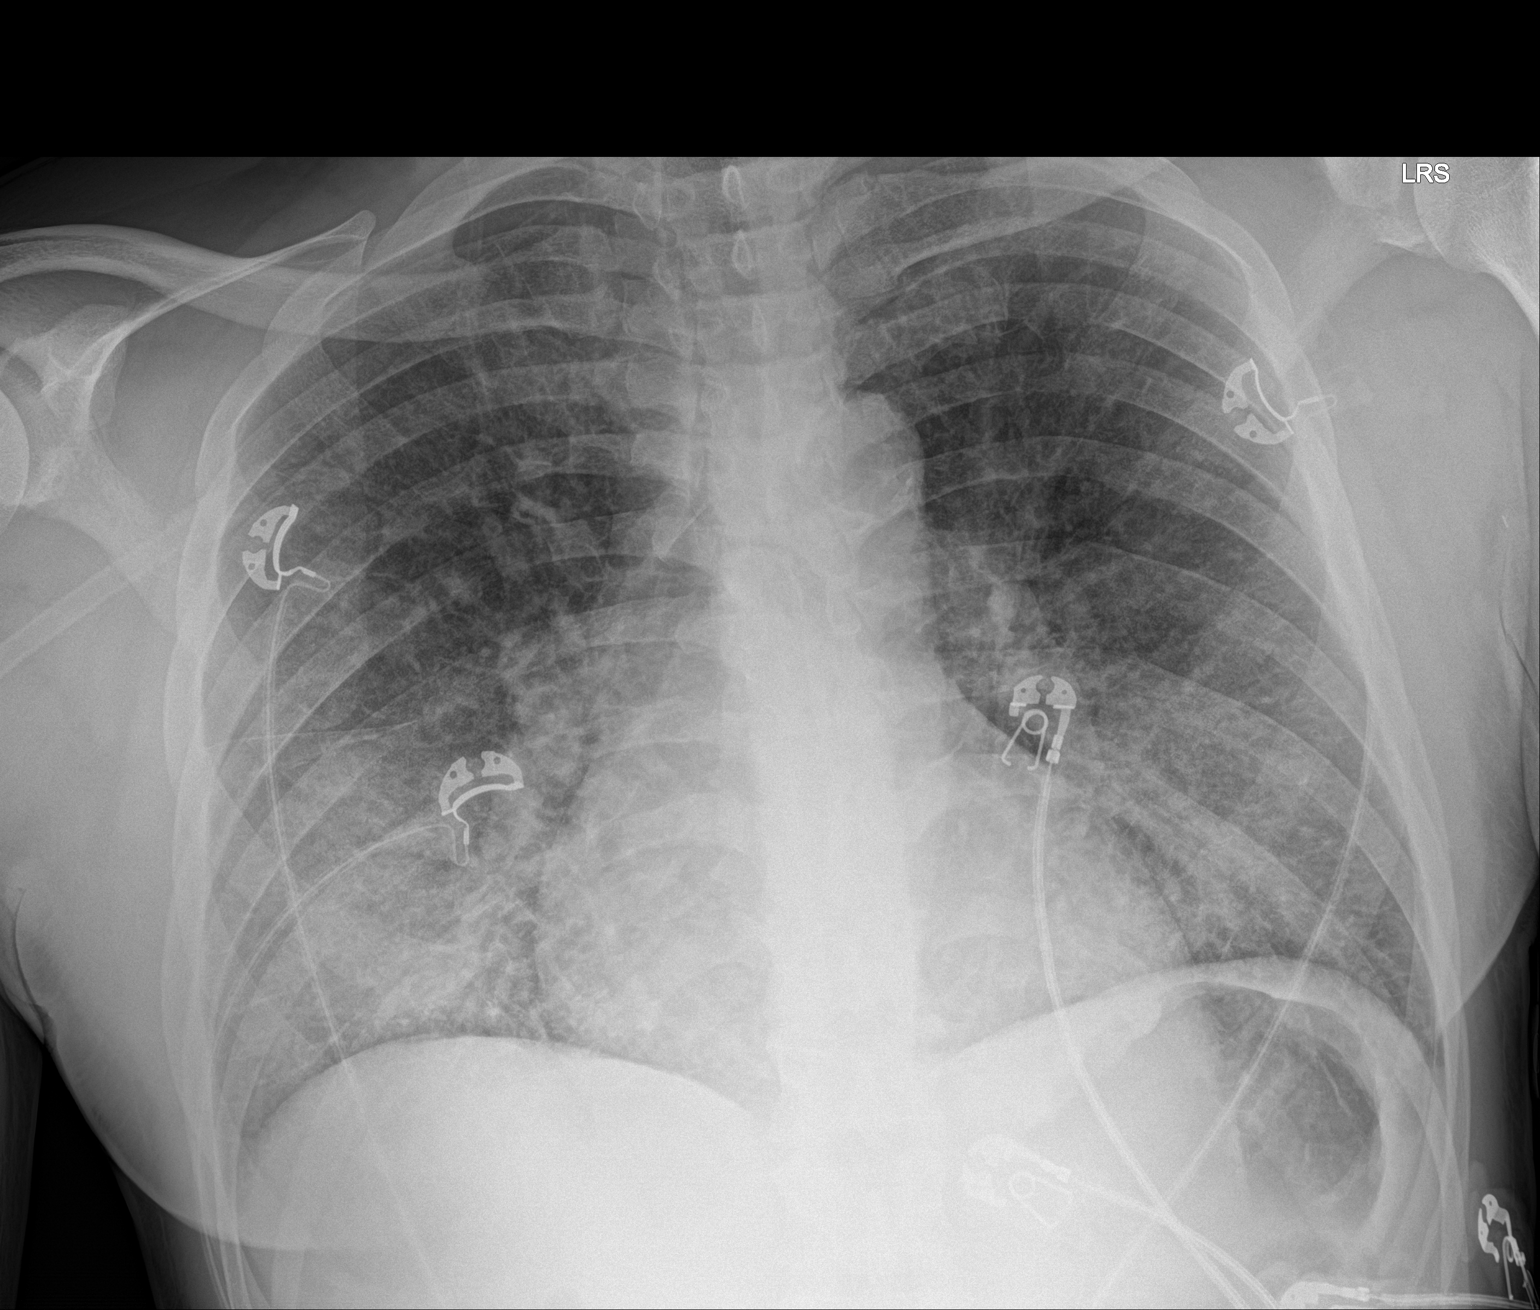

[1 of 1 positions shown; findings below may reference images not displayed]

FINDINGS: Stable borderline cardiomegaly with moderate aortic atherosclerosis.
No aneurysm. Diffuse pulmonary edema with more confluent opacities
at the right greater than left lung base. No effusion or
pneumothorax. No acute nor suspicious osseous abnormalities.
IMPRESSION: 1. Stable cardiomegaly with aortic atherosclerosis.
2. Redemonstration of what appears to be diffuse pulmonary edema
possibly noncardiogenic given history of dialysis. There are more
confluent airspace opacities noted at the right lung base, more
likely related to the edema however superimposed pneumonia would be
difficult to entirely exclude.

## 2018-07-18 MED ORDER — VITAMIN B-1 100 MG PO TABS: 100.0000 mg | ORAL_TABLET | Freq: Every day | ORAL | Status: DC

## 2018-07-18 MED ORDER — LIDOCAINE-PRILOCAINE 2.5-2.5 % EX CREA: 1.0000 "application " | TOPICAL_CREAM | CUTANEOUS | Status: DC | PRN

## 2018-07-18 MED ORDER — ACETAMINOPHEN 325 MG PO TABS: 650.0000 mg | ORAL_TABLET | Freq: Four times a day (QID) | ORAL | Status: DC | PRN

## 2018-07-18 MED ORDER — SODIUM CHLORIDE 0.9 % IV SOLN: 100.0000 mL | INTRAVENOUS | Status: DC | PRN

## 2018-07-18 MED ORDER — ONDANSETRON HCL 4 MG/2ML IJ SOLN: 4.0000 mg | Freq: Four times a day (QID) | INTRAMUSCULAR | Status: DC | PRN

## 2018-07-18 MED ORDER — ONDANSETRON HCL 4 MG PO TABS: 4.0000 mg | ORAL_TABLET | Freq: Four times a day (QID) | ORAL | Status: DC | PRN

## 2018-07-18 MED ORDER — IPRATROPIUM BROMIDE 0.02 % IN SOLN
0.5000 mg | Freq: Once | RESPIRATORY_TRACT | Status: AC
  Administered 2018-07-18: 0.5 mg via RESPIRATORY_TRACT
  Filled 2018-07-18: qty 2.5

## 2018-07-18 MED ORDER — NICOTINE 21 MG/24HR TD PT24: 21.0000 mg | MEDICATED_PATCH | Freq: Every day | TRANSDERMAL | Status: DC

## 2018-07-18 MED ORDER — DIPHENHYDRAMINE HCL 50 MG/ML IJ SOLN
25.0000 mg | Freq: Once | INTRAMUSCULAR | Status: AC
  Administered 2018-07-18: 25 mg via INTRAVENOUS
  Filled 2018-07-18: qty 1

## 2018-07-18 MED ORDER — METHYLPREDNISOLONE SODIUM SUCC 125 MG IJ SOLR
125.0000 mg | Freq: Once | INTRAMUSCULAR | Status: AC
  Administered 2018-07-18: 125 mg via INTRAVENOUS
  Filled 2018-07-18: qty 2

## 2018-07-18 MED ORDER — CHLORHEXIDINE GLUCONATE CLOTH 2 % EX PADS: 6.0000 | MEDICATED_PAD | Freq: Every day | CUTANEOUS | Status: DC

## 2018-07-18 MED ORDER — HEPARIN SODIUM (PORCINE) 1000 UNIT/ML DIALYSIS: 4000.0000 [IU] | INTRAMUSCULAR | Status: DC | PRN

## 2018-07-18 MED ORDER — FOLIC ACID 1 MG PO TABS: 1.0000 mg | ORAL_TABLET | Freq: Every day | ORAL | Status: DC

## 2018-07-18 MED ORDER — HEPARIN SODIUM (PORCINE) 5000 UNIT/ML IJ SOLN: 5000.0000 [IU] | Freq: Three times a day (TID) | INTRAMUSCULAR | Status: DC

## 2018-07-18 MED ORDER — LIDOCAINE HCL (PF) 1 % IJ SOLN: 5.0000 mL | INTRAMUSCULAR | Status: DC | PRN

## 2018-07-18 MED ORDER — SEVELAMER CARBONATE 800 MG PO TABS
800.0000 mg | ORAL_TABLET | Freq: Two times a day (BID) | ORAL | Status: DC
  Administered 2018-07-19: 800 mg via ORAL
  Filled 2018-07-18 (×2): qty 1

## 2018-07-18 MED ORDER — AMLODIPINE BESYLATE 10 MG PO TABS: 10.0000 mg | ORAL_TABLET | Freq: Every day | ORAL | Status: DC

## 2018-07-18 MED ORDER — ALBUTEROL SULFATE (2.5 MG/3ML) 0.083% IN NEBU
5.0000 mg | INHALATION_SOLUTION | Freq: Once | RESPIRATORY_TRACT | Status: AC
  Administered 2018-07-18: 5 mg via RESPIRATORY_TRACT
  Filled 2018-07-18: qty 6

## 2018-07-18 MED ORDER — CALCIUM ACETATE (PHOS BINDER) 667 MG PO CAPS
1334.0000 mg | ORAL_CAPSULE | Freq: Three times a day (TID) | ORAL | Status: DC
  Filled 2018-07-18 (×2): qty 2

## 2018-07-18 MED ORDER — HYDROXYZINE HCL 25 MG PO TABS
50.0000 mg | ORAL_TABLET | ORAL | Status: DC | PRN
  Administered 2018-07-19: 50 mg via ORAL
  Filled 2018-07-18: qty 2

## 2018-07-18 MED ORDER — PENTAFLUOROPROP-TETRAFLUOROETH EX AERO: 1.0000 "application " | INHALATION_SPRAY | CUTANEOUS | Status: DC | PRN

## 2018-07-18 NOTE — H&P (Signed)
History and Physical   Jordan [name] XBD:[id number] DOB: [birth year] DOA: 07/18/2018  Referring MD/NP/PA: Dr. Darl Householder  PCP: System, Pcp Not In   Outpatient Specialists: Nephrology  Patient coming from: Home  Chief Complaint: Shortness of breath  HPI: Jordan [name] is a 52 y.o. male with medical history significant of end-stage renal disease on hemodialysis on Mondays Wednesdays and Fridays, hypertension who, anemia of chronic disease who missed his dialysis for 2 days secondary to itching.  Patient was seen in the ER with shortness of breath bilateral crackles as well as generalized fluid overload.  He is being admitted for emergent hemodialysis.  Nephrology has been consulted.  ED Course: Temperature is 98 blood pressure 170/107 pulse 116 respiratory rate of 18 oxygen sat 98% room air.  White count 8.2 with hemoglobin 9.5 and creatinine 19.14 BUN is 76.  Phosphorus is 6.1 and magnesium 3.0.  Chest x-ray showed diffuse pulmonary edema.  Patient has received steroids and oxygen as well as nebulizer treatment.  Nephrology has been consulted and patient will be admitted for hemodialysis.  Review of Systems: As per HPI otherwise 10 point review of systems negative.    Past Medical History:  Diagnosis Date  . Anemia   . CKD (chronic kidney disease)    Stage 5  Dialysis - M/W/F  . ETOH abuse   . Hypertension     Past Surgical History:  Procedure Laterality Date  . BASCILIC VEIN TRANSPOSITION Left 08/07/2016   Procedure: LEFT BASILIC VEIN TRANSPOSITION;  Surgeon: Angelia Mould, MD;  Location: [place];  Service: Vascular;  Laterality: Left;  . COLONOSCOPY N/A 08/12/2016   Procedure: COLONOSCOPY;  Surgeon: Otis Brace, MD;  Location: [place];  Service: Gastroenterology;  Laterality: N/A;  . ESOPHAGOGASTRODUODENOSCOPY N/A 08/12/2016   Procedure: ESOPHAGOGASTRODUODENOSCOPY (EGD);  Surgeon: Otis Brace, MD;  Location: [place];  Service: Gastroenterology;   Laterality: N/A;  . INSERTION OF DIALYSIS CATHETER N/A 08/07/2016   Procedure: INSERTION OF TUNNELED DIALYSIS CATHETER;  Surgeon: Angelia Mould, MD;  Location: [place];  Service: Vascular;  Laterality: N/A;  . LIGATION OF ARTERIOVENOUS  FISTULA Left 09/12/2016   Procedure: BANDING OF LEFT  ARTERIOVENOUS  FISTULA;  Surgeon: Angelia Mould, MD;  Location: [place];  Service: Vascular;  Laterality: Left;  . NO PAST SURGERIES       reports that he has been smoking cigarettes. He has a 0.75 pack-year smoking history. He has never used smokeless tobacco. He reports that he drinks about 21.0 standard drinks of alcohol per week. He reports that he does not use drugs.  No Known Allergies  Family History  Problem Relation Age of Onset  . Diabetes Mother   . Kidney failure Mother   . Kidney failure Brother   . Post-traumatic stress disorder Neg Hx   . Bladder Cancer Neg Hx   . Kidney cancer Neg Hx      Prior to Admission medications   Medication Sig Start Date End Date Taking? Authorizing Provider  acetaminophen (TYLENOL) 325 MG tablet Take 2 tablets (650 mg total) by mouth every 6 (six) hours as needed for mild pain (or Fever >/= 101). 05/17/18   Cherene Altes, MD  amLODipine (NORVASC) 10 MG tablet Take 1 tablet (10 mg total) by mouth daily. 05/17/18   Cherene Altes, MD  calcium acetate (PHOSLO) 667 MG capsule Take 1,334 mg by mouth 3 (three) times daily with meals.    [provider]  Darbepoetin Alfa (ARANESP) 60  MCG/0.3ML SOSY injection Inject 0.3 mLs (60 mcg total) into the vein every Monday with hemodialysis. 05/24/18   Cherene Altes, MD  folic acid (FOLVITE) 1 MG tablet Take 1 tablet (1 mg total) by mouth daily. 03/19/18   Alphonzo Grieve, MD  hydrOXYzine (ATARAX/VISTARIL) 50 MG tablet Take 50 mg by mouth as needed. 12/25/17   [provider]  RENVELA 800 MG tablet Take 800 mg by mouth 2 (two) times daily. 01/06/18   [provider]  thiamine  100 MG tablet Take 1 tablet (100 mg total) by mouth daily. 03/19/18   Alphonzo Grieve, MD    Physical Exam: Vitals:   07/18/18 2009  BP: (!) 170/107  Pulse: (!) 116  Resp: 18  Temp: 98 F (36.7 C)  TempSrc: Oral  SpO2: 98%      Constitutional: NAD, calm, comfortable Vitals:   07/18/18 2009  BP: (!) 170/107  Pulse: (!) 116  Resp: 18  Temp: 98 F (36.7 C)  TempSrc: Oral  SpO2: 98%   Eyes: PERRL, lids and conjunctivae normal ENMT: Mucous membranes are moist. Posterior pharynx clear of any exudate or lesions.Normal dentition.  Neck: normal, supple, no masses, no thyromegaly Respiratory: Mild expiratory wheezes with diffuse bilateral crackles. Normal respiratory effort. No accessory muscle use.  Cardiovascular: Regular rate and rhythm, no murmurs / rubs / gallops. No extremity edema. 2+ pedal pulses. No carotid bruits.  Abdomen: no tenderness, no masses palpated. No hepatosplenomegaly. Bowel sounds positive.  Musculoskeletal: no clubbing / cyanosis. No joint deformity upper and lower extremities. Good ROM, no contractures. Normal muscle tone.  Skin: no rashes, lesions, ulcers. No induration Neurologic: CN 2-12 grossly intact. Sensation intact, DTR normal. Strength 5/5 in all 4.  Psychiatric: Normal judgment and insight. Alert and oriented x 3. Normal mood.     Labs on Admission: I have personally reviewed following labs and imaging studies  CBC: Recent Labs  Lab 07/18/18 2025 07/18/18 2110  WBC  --  10.2  NEUTROABS  --  7.0  HGB 9.5* 9.5*  HCT 28.0* 29.5*  MCV  --  101.0*  PLT  --  157   Basic Metabolic Panel: Recent Labs  Lab 07/18/18 2025 07/18/18 2110  NA 138 140  K 5.0 5.1  CL 100 100  CO2  --  23  GLUCOSE 94 93  BUN 77* 76*  CREATININE >18.00* 19.14*  CALCIUM  --  9.6  MG  --  3.0*  PHOS  --  6.1*   GFR: CrCl cannot be calculated (Unknown ideal weight.). Liver Function Tests: Recent Labs  Lab 07/18/18 2110  AST 16  ALT 11  ALKPHOS 55    BILITOT 0.9  PROT 7.6  ALBUMIN 3.5   No results for input(s): LIPASE, AMYLASE in the last 168 hours. No results for input(s): AMMONIA in the last 168 hours. Coagulation Profile: No results for input(s): INR, PROTIME in the last 168 hours. Cardiac Enzymes: No results for input(s): CKTOTAL, CKMB, CKMBINDEX, TROPONINI in the last 168 hours. BNP (last 3 results) No results for input(s): PROBNP in the last 8760 hours. HbA1C: No results for input(s): HGBA1C in the last 72 hours. CBG: No results for input(s): GLUCAP in the last 168 hours. Lipid Profile: No results for input(s): CHOL, HDL, LDLCALC, TRIG, CHOLHDL, LDLDIRECT in the last 72 hours. Thyroid Function Tests: No results for input(s): TSH, T4TOTAL, FREET4, T3FREE, THYROIDAB in the last 72 hours. Anemia Panel: No results for input(s): VITAMINB12, FOLATE, FERRITIN, TIBC, IRON, RETICCTPCT in  the last 72 hours. Urine analysis:    Component Value Date/Time   COLORURINE STRAW (A) 08/04/2016 0448   APPEARANCEUR CLOUDY (A) 08/04/2016 0448   LABSPEC 1.011 08/04/2016 0448   PHURINE 5.5 08/04/2016 0448   GLUCOSEU 100 (A) 08/04/2016 0448   HGBUR SMALL (A) 08/04/2016 0448   BILIRUBINUR NEGATIVE 08/04/2016 0448   KETONESUR NEGATIVE 08/04/2016 0448   PROTEINUR >300 (A) 08/04/2016 0448   NITRITE NEGATIVE 08/04/2016 0448   LEUKOCYTESUR NEGATIVE 08/04/2016 0448   Sepsis Labs: @LABRCNTIP (procalcitonin:4,lacticidven:4) )No results found for this or any previous visit (from the past 240 hour(s)).   Radiological Exams on Admission: Dg Chest Port 1 View  Result Date: 07/18/2018 CLINICAL DATA:  Itching all over since Thursday. Patient unable to go to dialysis due to feeling poorly. Swelling beneath the thighs. Dyspnea and cough today. EXAM: PORTABLE CHEST 1 VIEW COMPARISON:  05/17/2018 FINDINGS: Stable borderline cardiomegaly with moderate aortic atherosclerosis. No aneurysm. Diffuse pulmonary edema with more confluent opacities at the right  greater than left lung base. No effusion or pneumothorax. No acute nor suspicious osseous abnormalities. IMPRESSION: 1. Stable cardiomegaly with aortic atherosclerosis. 2. Redemonstration of what appears to be diffuse pulmonary edema possibly noncardiogenic given history of dialysis. There are more confluent airspace opacities noted at the right lung base, more likely related to the edema however superimposed pneumonia would be difficult to entirely exclude. Electronically Signed   By: Ashley Royalty M.D.   On: 07/18/2018 22:17    EKG: Independently reviewed.  It showed sinus tachycardia with a rate of 122.  No significant ST changes  Assessment/Plan Principal Problem:   Pulmonary edema Active Problems:   Tobacco abuse   ETOH abuse   Uncontrolled hypertension   ESRD on dialysis ([place])     #1 pulmonary edema: Secondary to patient missing his hemodialysis.  Admit the patient and continue empiric nebulizer.  Hemodialysis as per nephrology.  #2 end-stage renal disease: As above  #3 alcohol abuse: Patient denied any recent alcohol intake.  Continue thiamine and folic acid.  Watch for evidence of DTs.  #4 uncontrolled hypertension: Partly due to missing hemodialysis.  Blood pressure medication will be continued from home on that monitor after hemodialysis.  #5 tobacco abuse: Counseling provided.  Nicotine patch.     DVT prophylaxis: Heparin Code Status: Full code Family Communication: None available Disposition Plan: Home Consults called: Dr. Posey Pronto of nephrology Admission status: Observation  Severity of Illness: The appropriate patient status for this patient is OBSERVATION. Observation status is judged to be reasonable and necessary in order to provide the required intensity of service to ensure the patient's safety. The patient's presenting symptoms, physical exam findings, and initial radiographic and laboratory data in the context of their medical condition is felt to place them at  decreased risk for further clinical deterioration. Furthermore, it is anticipated that the patient will be medically stable for discharge from the hospital within 2 midnights of admission. The following factors support the patient status of observation.   " The patient's presenting symptoms include shortness of breath and hypoxia. " The physical exam findings include hypoxemia fluid overload. " The initial radiographic and laboratory data are pulmonary edema on chest x-ray.     Barbette Merino MD [facility] Pager [phone]  If 7PM-7AM, please contact night-coverage [email or web address] Password [email or web address]  07/18/2018, 11:05 PM

## 2018-07-18 NOTE — ED Triage Notes (Addendum)
C/o itching all over since Thursday.  States he was unable to go to dialysis on Friday because he felt so bad.  It appears pt has swelling under bilateral eyes but pt denies and states that is normal for him.  Pt believes it is related to his phosphorus level.

## 2018-07-18 NOTE — ED Notes (Signed)
Pt complains of the itching still. Pt states the medication has not helped.

## 2018-07-18 NOTE — ED Provider Notes (Signed)
[facility] EMERGENCY DEPARTMENT Provider Note   CSN: [id number] Arrival date & time: 07/18/18  2004     History   Chief Complaint Chief Complaint  Patient presents with  . itching all over  . Shortness of Breath    HPI Jordan [name] is a 52 y.o. male hx of CKD on HD, ETOH abuse, HTN, here presented with shortness of breath, itchiness.  Patient states that he has progressive shortness of breath over the last several days.  Patient also has increased itchiness throughout.  He states that he cannot sit in the dialysis chair so he missed his dialysis 2 days ago.  His last dialysis was Wednesday  (4 days ago).  Patient denies any new drugs or shampoos.  Denies any exposures to allergens.  Patient was admitted for pulmonary edema several months ago.  The history is provided by the patient.    Past Medical History:  Diagnosis Date  . Anemia   . CKD (chronic kidney disease)    Stage 5  Dialysis - M/W/F  . ETOH abuse   . Hypertension     Patient Active Problem List   Diagnosis Date Noted  . Anemia in ESRD (end-stage renal disease) ([place]) 05/17/2018  . Hepatitis B 03/19/2018  . Encephalopathy 03/19/2018  . 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 History of macrocytic anemia 03/19/2018  . History of iron deficiency anemia 03/19/2018  . ESRD on dialysis ([place]) 05/26/2017  . Essential hypertension 05/26/2017  . Gastritis with hemorrhage 05/26/2017  . Tobacco abuse 08/04/2016  . ETOH abuse 08/04/2016  . Uncontrolled hypertension 08/04/2016  . Acute renal failure (ARF) ([place]) 08/04/2016  . Symptomatic anemia 08/04/2016    Past Surgical History:  Procedure Laterality Date  . BASCILIC VEIN TRANSPOSITION Left 08/07/2016   Procedure: LEFT BASILIC VEIN TRANSPOSITION;  Surgeon: Angelia Mould, MD;  Location: [place];  Service: Vascular;  Laterality: Left;  . COLONOSCOPY N/A 08/12/2016   Procedure: COLONOSCOPY;  Surgeon: Otis Brace, MD;   Location: [place];  Service: Gastroenterology;  Laterality: N/A;  . ESOPHAGOGASTRODUODENOSCOPY N/A 08/12/2016   Procedure: ESOPHAGOGASTRODUODENOSCOPY (EGD);  Surgeon: Otis Brace, MD;  Location: [facility];  Service: Gastroenterology;  Laterality: N/A;  . INSERTION OF DIALYSIS CATHETER N/A 08/07/2016   Procedure: INSERTION OF TUNNELED DIALYSIS CATHETER;  Surgeon: Angelia Mould, MD;  Location: [facility];  Service: Vascular;  Laterality: N/A;  . LIGATION OF ARTERIOVENOUS  FISTULA Left 09/12/2016   Procedure: BANDING OF LEFT  ARTERIOVENOUS  FISTULA;  Surgeon: Angelia Mould, MD;  Location: [facility];  Service: Vascular;  Laterality: Left;  . NO PAST SURGERIES          Home Medications    Prior to Admission medications   Medication Sig Start Date End Date Taking? Authorizing Provider  acetaminophen (TYLENOL) 325 MG tablet Take 2 tablets (650 mg total) by mouth every 6 (six) hours as needed for mild pain (or Fever >/= 101). 05/17/18   Cherene Altes, MD  amLODipine (NORVASC) 10 MG tablet Take 1 tablet (10 mg total) by mouth daily. 05/17/18   Cherene Altes, MD  calcium acetate (PHOSLO) 667 MG capsule Take 1,334 mg by mouth 3 (three) times daily with meals.    [provider]  Darbepoetin Alfa (ARANESP) 60 MCG/0.3ML SOSY injection Inject 0.3 mLs (60 mcg total) into the vein every Monday with hemodialysis. 05/24/18   Cherene Altes, MD  folic acid (FOLVITE) 1 MG tablet Take  1 tablet (1 mg total) by mouth daily. 03/19/18   Alphonzo Grieve, MD  hydrOXYzine (ATARAX/VISTARIL) 50 MG tablet Take 50 mg by mouth as needed. 12/25/17   [provider]  RENVELA 800 MG tablet Take 800 mg by mouth 2 (two) times daily. 01/06/18   [provider]  thiamine 100 MG tablet Take 1 tablet (100 mg total) by mouth daily. 03/19/18   Alphonzo Grieve, MD    Family History Family History  Problem Relation Age of Onset  . Diabetes Mother   . Kidney failure Mother   .  Kidney failure Brother   . Post-traumatic stress disorder Neg Hx   . Bladder Cancer Neg Hx   . Kidney cancer Neg Hx     Social History Social History   Tobacco Use  . Smoking status: Current Every Day Smoker    Packs/day: 0.05    Years: 15.00    Pack years: 0.75    Types: Cigarettes  . Smokeless tobacco: Never Used  . Tobacco comment: down to 1 a day  Substance Use Topics  . Alcohol use: Yes    Alcohol/week: 21.0 standard drinks    Types: 21 Cans of beer per week    Comment: none since 08/04/16  . Drug use: No     Allergies   Patient has no known allergies.   Review of Systems Review of Systems  Respiratory: Positive for shortness of breath.   All other systems reviewed and are negative.    Physical Exam Updated Vital Signs BP (!) 170/107 (BP Location: Right Arm)   Pulse (!) 116   Temp 98 F (36.7 C) (Oral)   Resp 18   SpO2 98%   Physical Exam  Constitutional:  Uncomfortable, tachypneic   HENT:  Head: Normocephalic.  Mouth/Throat: Oropharynx is clear and moist.  Eyes: Pupils are equal, round, and reactive to light. EOM are normal.  Neck: Normal range of motion.  Cardiovascular:  Tachycardic   Pulmonary/Chest:  Tachypneic, + crackles bilateral bases   Abdominal: Soft. Bowel sounds are normal.  Musculoskeletal:       Right lower leg: Normal.  1+ edema bilaterally   Neurological: He is alert.  Skin: Skin is warm. Capillary refill takes less than 2 seconds.  No obvious urticaria or rash or cellulitis. No rash on web spaces   Psychiatric: He has a normal mood and affect.  Nursing note and vitals reviewed.    ED Treatments / Results  Labs (all labs ordered are listed, but only abnormal results are displayed) Labs Reviewed  CBC WITH DIFFERENTIAL/PLATELET - Abnormal; Notable for the following components:      Result Value   RBC 2.92 (*)    Hemoglobin 9.5 (*)    HCT 29.5 (*)    MCV 101.0 (*)    Eosinophils Absolute 0.8 (*)    All other  components within normal limits  COMPREHENSIVE METABOLIC PANEL - Abnormal; Notable for the following components:   BUN 76 (*)    Creatinine, Ser 19.14 (*)    GFR calc non Af Amer 2 (*)    GFR calc Af Amer 3 (*)    Anion gap 17 (*)    All other components within normal limits  MAGNESIUM - Abnormal; Notable for the following components:   Magnesium 3.0 (*)    All other components within normal limits  PHOSPHORUS - Abnormal; Notable for the following components:   Phosphorus 6.1 (*)    All other components within normal limits  I-STAT CHEM 8, ED - Abnormal; Notable for the following components:   BUN 77 (*)    Creatinine, Ser >18.00 (*)    Calcium, Ion 1.11 (*)    Hemoglobin 9.5 (*)    HCT 28.0 (*)    All other components within normal limits  ETHANOL  I-STAT TROPONIN, ED    EKG EKG Interpretation  Date/Time:  Sunday July 18 2018 21:43:12 EDT Ventricular Rate:  125 PR Interval:    QRS Duration: 89 QT Interval:  325 QTC Calculation: 469 R Axis:   77 Text Interpretation:  Sinus tachycardia Ventricular premature complex Aberrant complex RSR' in V1 or V2, probably normal variant Baseline wander in lead(s) II III aVF V1 V2 Since last tracing rate faster Confirmed by Wandra Arthurs ([id number]) on 07/18/2018 9:45:44 PM   Radiology Dg Chest Port 1 View  Result Date: 07/18/2018 CLINICAL DATA:  Itching all over since Thursday. Patient unable to go to dialysis due to feeling poorly. Swelling beneath the thighs. Dyspnea and cough today. EXAM: PORTABLE CHEST 1 VIEW COMPARISON:  05/17/2018 FINDINGS: Stable borderline cardiomegaly with moderate aortic atherosclerosis. No aneurysm. Diffuse pulmonary edema with more confluent opacities at the right greater than left lung base. No effusion or pneumothorax. No acute nor suspicious osseous abnormalities. IMPRESSION: 1. Stable cardiomegaly with aortic atherosclerosis. 2. Redemonstration of what appears to be diffuse pulmonary edema possibly  noncardiogenic given history of dialysis. There are more confluent airspace opacities noted at the right lung base, more likely related to the edema however superimposed pneumonia would be difficult to entirely exclude. Electronically Signed   By: Ashley Royalty M.D.   On: 07/18/2018 22:17    Procedures Procedures (including critical care time)  CRITICAL CARE Performed by: Wandra Arthurs   Total critical care time: 30 minutes  Critical care time was exclusive of separately billable procedures and treating other patients.  Critical care was necessary to treat or prevent imminent or life-threatening deterioration.  Critical care was time spent personally by me on the following activities: development of treatment plan with patient and/or surrogate as well as nursing, discussions with consultants, evaluation of patient's response to treatment, examination of patient, obtaining history from patient or surrogate, ordering and performing treatments and interventions, ordering and review of laboratory studies, ordering and review of radiographic studies, pulse oximetry and re-evaluation of patient's condition.   Medications Ordered in ED Medications  methylPREDNISolone sodium succinate (SOLU-MEDROL) 125 mg/2 mL injection 125 mg (125 mg Intravenous Not Given 07/18/18 2133)  Chlorhexidine Gluconate Cloth 2 % PADS 6 each (has no administration in time range)  diphenhydrAMINE (BENADRYL) injection 25 mg (25 mg Intravenous Given 07/18/18 2131)  albuterol (PROVENTIL) (2.5 MG/3ML) 0.083% nebulizer solution 5 mg (5 mg Nebulization Given 07/18/18 2153)  ipratropium (ATROVENT) nebulizer solution 0.5 mg (0.5 mg Nebulization Given 07/18/18 2153)     Initial Impression / Assessment and Plan / ED Course  I have reviewed the triage vital signs and the nursing notes.  Pertinent labs & imaging results that were available during my care of the patient were reviewed by me and considered in my medical decision making  (see chart for details).     Jordan [name] is a 52 y.o. male here with SOB, itchiness. I think patient likely has pulmonary edema, uremia causing his symptoms. Will get labs, CXR. Will give nebs, steroids, benadryl.   11:13 PM Cr 20, K 5.1. CXR showed pulmonary edema. I talked to Dr. Posey Pronto from nephrology. He will put in  dialysis orders. Since patient is in pulmonary edema, will admit to hospitalist service.    Final Clinical Impressions(s) / ED Diagnoses   Final diagnoses:  None    ED Discharge Orders    None       Drenda Freeze, MD 07/18/18 2314

## 2018-07-18 NOTE — ED Notes (Signed)
Pt's breathing returning to normal, HR continues to be 112s.  This RN made two additional attempts for an IV, and [place], RN attempted x 1.  Will attempt again when patient's breathing returns to more relaxed state.

## 2018-07-18 NOTE — ED Notes (Signed)
This RN went in to start IV.  Patient became SOB, started coughing up frothy, blood-tinged sputum while this RN attempted IV.  Patient then became diaphoretic and began to ask for supplemental O2.   EDP made aware.

## 2018-07-19 ENCOUNTER — Other Ambulatory Visit: Payer: Self-pay

## 2018-07-19 DIAGNOSIS — N186 End stage renal disease: Secondary | ICD-10-CM | POA: Diagnosis not present

## 2018-07-19 DIAGNOSIS — R0602 Shortness of breath: Secondary | ICD-10-CM | POA: Diagnosis not present

## 2018-07-19 DIAGNOSIS — Z720 Tobacco use: Secondary | ICD-10-CM | POA: Diagnosis not present

## 2018-07-19 DIAGNOSIS — J810 Acute pulmonary edema: Secondary | ICD-10-CM | POA: Diagnosis not present

## 2018-07-19 DIAGNOSIS — F1010 Alcohol abuse, uncomplicated: Secondary | ICD-10-CM | POA: Diagnosis not present

## 2018-07-19 LAB — CBC
HCT: 29.1 % — ABNORMAL LOW (ref 39.0–52.0)
HCT: 29.8 % — ABNORMAL LOW (ref 39.0–52.0)
Hemoglobin: 9.1 g/dL — ABNORMAL LOW (ref 13.0–17.0)
Hemoglobin: 9.1 g/dL — ABNORMAL LOW (ref 13.0–17.0)
MCH: 31.1 pg (ref 26.0–34.0)
MCH: 31.6 pg (ref 26.0–34.0)
MCHC: 30.5 g/dL (ref 30.0–36.0)
MCHC: 31.3 g/dL (ref 30.0–36.0)
MCV: 101 fL — ABNORMAL HIGH (ref 80.0–100.0)
MCV: 101.7 fL — ABNORMAL HIGH (ref 80.0–100.0)
Platelets: 163 10*3/uL (ref 150–400)
Platelets: 190 10*3/uL (ref 150–400)
RBC: 2.88 MIL/uL — ABNORMAL LOW (ref 4.22–5.81)
RBC: 2.93 MIL/uL — ABNORMAL LOW (ref 4.22–5.81)
RDW: 12.9 % (ref 11.5–15.5)
RDW: 13.1 % (ref 11.5–15.5)
WBC: 12.7 10*3/uL — ABNORMAL HIGH (ref 4.0–10.5)
WBC: 9.5 10*3/uL (ref 4.0–10.5)
nRBC: 0 % (ref 0.0–0.2)
nRBC: 0 % (ref 0.0–0.2)

## 2018-07-19 LAB — COMPREHENSIVE METABOLIC PANEL
ALT: 11 U/L (ref 0–44)
AST: 14 U/L — ABNORMAL LOW (ref 15–41)
Albumin: 3.6 g/dL (ref 3.5–5.0)
Alkaline Phosphatase: 52 U/L (ref 38–126)
Anion gap: 19 — ABNORMAL HIGH (ref 5–15)
BUN: 83 mg/dL — ABNORMAL HIGH (ref 6–20)
CO2: 21 mmol/L — ABNORMAL LOW (ref 22–32)
Calcium: 9.4 mg/dL (ref 8.9–10.3)
Chloride: 99 mmol/L (ref 98–111)
Creatinine, Ser: 20.49 mg/dL — ABNORMAL HIGH (ref 0.61–1.24)
GFR calc Af Amer: 3 mL/min — ABNORMAL LOW (ref >60)
GFR calc non Af Amer: 2 mL/min — ABNORMAL LOW (ref >60)
Glucose, Bld: 112 mg/dL — ABNORMAL HIGH (ref 70–99)
Potassium: 4.9 mmol/L (ref 3.5–5.1)
Sodium: 139 mmol/L (ref 135–145)
Total Bilirubin: 1 mg/dL (ref 0.3–1.2)
Total Protein: 7.3 g/dL (ref 6.5–8.1)

## 2018-07-19 LAB — CREATININE, SERUM
Creatinine, Ser: 19.74 mg/dL — ABNORMAL HIGH (ref 0.61–1.24)
GFR calc Af Amer: 3 mL/min — ABNORMAL LOW (ref >60)
GFR calc non Af Amer: 2 mL/min — ABNORMAL LOW (ref >60)

## 2018-07-19 LAB — TSH: TSH: 0.934 u[IU]/mL (ref 0.350–4.500)

## 2018-07-19 MED ORDER — RENA-VITE PO TABS: 1.0000 | ORAL_TABLET | Freq: Every day | ORAL | 0 refills | Status: AC

## 2018-07-19 MED ORDER — RENA-VITE PO TABS: 1.0000 | ORAL_TABLET | Freq: Every day | ORAL | Status: DC

## 2018-07-19 MED ORDER — DARBEPOETIN ALFA 40 MCG/0.4ML IJ SOSY
40.0000 ug | PREFILLED_SYRINGE | INTRAMUSCULAR | Status: DC
  Administered 2018-07-19: 40 ug via INTRAVENOUS

## 2018-07-19 MED ORDER — CALCITRIOL 0.5 MCG PO CAPS
ORAL_CAPSULE | ORAL | Status: AC
  Filled 2018-07-19: qty 4

## 2018-07-19 MED ORDER — CALCIUM ACETATE (PHOS BINDER) 667 MG PO CAPS
4002.0000 mg | ORAL_CAPSULE | Freq: Three times a day (TID) | ORAL | Status: DC
  Administered 2018-07-19: 4002 mg via ORAL
  Filled 2018-07-19: qty 6

## 2018-07-19 MED ORDER — NEPRO/CARBSTEADY PO LIQD
237.0000 mL | Freq: Two times a day (BID) | ORAL | Status: DC
  Filled 2018-07-19 (×2): qty 237

## 2018-07-19 MED ORDER — CALCITRIOL 0.5 MCG PO CAPS
2.2500 ug | ORAL_CAPSULE | ORAL | Status: DC
  Administered 2018-07-19: 2.25 ug via ORAL

## 2018-07-19 MED ORDER — CALCITRIOL 0.25 MCG PO CAPS: 2.2500 ug | ORAL_CAPSULE | ORAL | 0 refills | Status: AC

## 2018-07-19 MED ORDER — DARBEPOETIN ALFA 40 MCG/0.4ML IJ SOSY
PREFILLED_SYRINGE | INTRAMUSCULAR | Status: AC
  Filled 2018-07-19: qty 0.4

## 2018-07-19 MED ORDER — CALCITRIOL 0.25 MCG PO CAPS
ORAL_CAPSULE | ORAL | Status: AC
  Filled 2018-07-19: qty 1

## 2018-07-19 MED ORDER — HYDROXYZINE HCL 50 MG PO TABS: 50.0000 mg | ORAL_TABLET | Freq: Four times a day (QID) | ORAL | 0 refills | Status: DC | PRN

## 2018-07-19 NOTE — Discharge Summary (Signed)
Physician Discharge Summary  Jordan [name] TWK:[id number] DOB: [birth year] DOA: 07/18/2018  PCP: System, Pcp Not In  Admit date: 07/18/2018 Discharge date: 07/19/2018  Admitted From: Home Disposition: Home  Recommendations for Outpatient Follow-up:  1. Follow up with PCP in 1-2 weeks 2. Please obtain BMP/CBC in one week your next doctors visit.  3. Follow-up with outpatient nephrology at the next dialysis  Home Health: None Equipment/Devices: None Discharge Condition: Stable CODE STATUS: Full code Diet recommendation: Renal  Brief/Interim Summary: 52 year old with history of ESRD on hemodialysis Monday Wednesday Friday at [facility], essential hypertension, alcohol use, tobacco use Came to the hospital with complaints of feeling shortness of breath and itching.  Patient states he goes to his routine dialysis on Monday Wednesday Friday and his last session was last Wednesday.  He missed Friday because he was having intense itching throughout his body therefore could not make it.  He came to the hospital reporting shortness of breath.  On the chest x-ray is found to have diffuse pulmonary edema therefore nephrology was consultation for hemodialysis. Patient tolerated his HD procedure well and felt better symptomatically after.  He is being discharged home in stable condition with outpatient follow-up with primary care provider and nephrology as stated above.  He will be given Atarax for itching.  Advised to remain compliant with his dialysis.   Discharge Diagnoses:  Principal Problem:   Pulmonary edema Active Problems:   Tobacco abuse   ETOH abuse   Uncontrolled hypertension   ESRD on dialysis [facility])  Acute respiratory distress secondary to pulmonary edema ESRD on hemodialysis, Monday Wednesday Friday -Patient missed his dialysis session last Friday therefore came to the hospital with signs of fluid overload.  He was dialyzed this morning and he feels better.  He can be  discharged after that and follow-up outpatient. -Have advised him to remain compliant with his HD sessions to avoid these kind of symptoms in the future.  Diffuse itching, better -We will prescribe Atarax  Essential hypertension - Initially blood pressure was elevated but this is improved since his dialysis.  Continue his home Norvasc 10 mg daily  Alcohol use and tobacco use -Nicotine patch while he was here.  Counseled to quit using alcohol and tobacco.  Heparin subcu for DVT prophylaxis while here Patient remains full code Discharged today in stable condition after his dialysis.  Discharge Instructions   Allergies as of 07/19/2018   No Known Allergies     Medication List    TAKE these medications   acetaminophen 325 MG tablet Commonly known as:  TYLENOL Take 2 tablets (650 mg total) by mouth every 6 (six) hours as needed for mild pain (or Fever >/= 101).   amLODipine 10 MG tablet Commonly known as:  NORVASC Take 1 tablet (10 mg total) by mouth daily.   calcitRIOL 0.25 MCG capsule Commonly known as:  ROCALTROL Take 9 capsules (2.25 mcg total) by mouth every Monday, Wednesday, and Friday with hemodialysis.   calcium acetate 667 MG capsule Commonly known as:  PHOSLO Take 1,334 mg by mouth 3 (three) times daily with meals.   Darbepoetin Alfa 60 MCG/0.3ML Sosy injection Commonly known as:  ARANESP Inject 0.3 mLs (60 mcg total) into the vein every Monday with hemodialysis.   folic acid 1 MG tablet Commonly known as:  FOLVITE Take 1 tablet (1 mg total) by mouth daily.   hydrOXYzine 50 MG tablet Commonly known as:  ATARAX/VISTARIL Take 1 tablet (50 mg total) by mouth every 6 (  six) hours as needed for itching. What changed:    when to take this  reasons to take this   multivitamin Tabs tablet Take 1 tablet by mouth at bedtime.   RENVELA 800 MG tablet Generic drug:  sevelamer carbonate Take 800 mg by mouth 2 (two) times daily.   thiamine 100 MG tablet Take 1  tablet (100 mg total) by mouth daily.   triamcinolone cream 0.1 % Commonly known as:  KENALOG      Follow-up [facility]. Schedule an appointment as soon as possible for a visit in 2 week(s).   Contact information: [place] [id number] [phone]         No Known Allergies  You were cared for by a hospitalist during your hospital stay. If you have any questions about your discharge medications or the care you received while you were in the hospital after you are discharged, you can call the unit and asked to speak with the hospitalist on call if the hospitalist that took care of you is not available. Once you are discharged, your primary care physician will handle any further medical issues. Please note that no refills for any discharge medications will be authorized once you are discharged, as it is imperative that you return to your primary care physician (or establish a relationship with a primary care physician if you do not have one) for your aftercare needs so that they can reassess your need for medications and monitor your lab values.  Consultations:  Nephrology   Procedures/Studies: Dg Chest Port 1 View  Result Date: 07/18/2018 CLINICAL DATA:  Itching all over since Thursday. Patient unable to go to dialysis due to feeling poorly. Swelling beneath the thighs. Dyspnea and cough today. EXAM: PORTABLE CHEST 1 VIEW COMPARISON:  05/17/2018 FINDINGS: Stable borderline cardiomegaly with moderate aortic atherosclerosis. No aneurysm. Diffuse pulmonary edema with more confluent opacities at the right greater than left lung base. No effusion or pneumothorax. No acute nor suspicious osseous abnormalities. IMPRESSION: 1. Stable cardiomegaly with aortic atherosclerosis. 2. Redemonstration of what appears to be diffuse pulmonary edema possibly noncardiogenic given history of dialysis. There are more confluent  airspace opacities noted at the right lung base, more likely related to the edema however superimposed pneumonia would be difficult to entirely exclude. Electronically Signed   By: Ashley Royalty M.D.   On: 07/18/2018 22:17      Subjective: Feels better after being on dialysis and fluid removal.  No new complaints. He tells me last week he had intense itching throughout his body therefore did not go to his dialysis session.  This morning this is better.  General = no fevers, chills, dizziness, malaise, fatigue HEENT/EYES = negative for pain, redness, loss of vision, double vision, blurred vision, loss of hearing, sore throat, hoarseness, dysphagia Cardiovascular= negative for chest pain, palpitation, murmurs, lower extremity swelling Respiratory/lungs= negative for shortness of breath, cough, hemoptysis, wheezing, mucus production Gastrointestinal= negative for nausea, vomiting,, abdominal pain, melena, hematemesis Genitourinary= negative for Dysuria, Hematuria, Change in Urinary Frequency MSK = Negative for arthralgia, myalgias, Back Pain, Joint swelling  Neurology= Negative for headache, seizures, numbness, tingling  Psychiatry= Negative for anxiety, depression, suicidal and homocidal ideation Allergy/Immunology= Medication/Food allergy as listed  Skin= Negative for Rash, lesions, ulcers, itching   Discharge Exam: Vitals:   07/19/18 0900 07/19/18 0930  BP: 134/75 137/81  Pulse: 92 92  Resp:    Temp:  SpO2: 100%    Vitals:   07/19/18 0800 07/19/18 0830 07/19/18 0900 07/19/18 0930  BP: (!) 155/85 (!) 142/82 134/75 137/81  Pulse: (!) 104 90 92 92  Resp:      Temp:      TempSrc:      SpO2: 100% 100% 100%     General: Pt is alert, awake, not in acute distress Cardiovascular: RRR, S1/S2 +, no rubs, no gallops Respiratory: CTA bilaterally, no wheezing, no rhonchi Abdominal: Soft, NT, ND, bowel sounds + Extremities: no edema, no cyanosis    The results of significant  diagnostics from this hospitalization (including imaging, microbiology, ancillary and laboratory) are listed below for reference.     Microbiology: No results found for this or any previous visit (from the past 240 hour(s)).   Labs: BNP (last 3 results) No results for input(s): BNP in the last 8760 hours. Basic Metabolic Panel: Recent Labs  Lab 07/18/18 2025 07/18/18 2110 07/19/18 0003 07/19/18 0349  NA 138 140  --  139  K 5.0 5.1  --  4.9  CL 100 100  --  99  CO2  --  23  --  21*  GLUCOSE 94 93  --  112*  BUN 77* 76*  --  83*  CREATININE >18.00* 19.14* 19.74* 20.49*  CALCIUM  --  9.6  --  9.4  MG  --  3.0*  --   --   PHOS  --  6.1*  --   --    Liver Function Tests: Recent Labs  Lab 07/18/18 2110 07/19/18 0349  AST 16 14*  ALT 11 11  ALKPHOS 55 52  BILITOT 0.9 1.0  PROT 7.6 7.3  ALBUMIN 3.5 3.6   No results for input(s): LIPASE, AMYLASE in the last 168 hours. No results for input(s): AMMONIA in the last 168 hours. CBC: Recent Labs  Lab 07/18/18 2025 07/18/18 2110 07/19/18 0003 07/19/18 0349  WBC  --  10.2 12.7* 9.5  NEUTROABS  --  7.0  --   --   HGB 9.5* 9.5* 9.1* 9.1*  HCT 28.0* 29.5* 29.1* 29.8*  MCV  --  101.0* 101.0* 101.7*  PLT  --  169 190 163   Cardiac Enzymes: No results for input(s): CKTOTAL, CKMB, CKMBINDEX, TROPONINI in the last 168 hours. BNP: Invalid input(s): POCBNP CBG: No results for input(s): GLUCAP in the last 168 hours. D-Dimer No results for input(s): DDIMER in the last 72 hours. Hgb A1c No results for input(s): HGBA1C in the last 72 hours. Lipid Profile No results for input(s): CHOL, HDL, LDLCALC, TRIG, CHOLHDL, LDLDIRECT in the last 72 hours. Thyroid function studies Recent Labs    07/19/18 0003  TSH 0.934   Anemia work up No results for input(s): VITAMINB12, FOLATE, FERRITIN, TIBC, IRON, RETICCTPCT in the last 72 hours. Urinalysis    Component Value Date/Time   COLORURINE STRAW (A) 08/04/2016 0448   APPEARANCEUR  CLOUDY (A) 08/04/2016 0448   LABSPEC 1.011 08/04/2016 0448   PHURINE 5.5 08/04/2016 0448   GLUCOSEU 100 (A) 08/04/2016 0448   HGBUR SMALL (A) 08/04/2016 0448   BILIRUBINUR NEGATIVE 08/04/2016 0448   KETONESUR NEGATIVE 08/04/2016 0448   PROTEINUR >300 (A) 08/04/2016 0448   NITRITE NEGATIVE 08/04/2016 0448   LEUKOCYTESUR NEGATIVE 08/04/2016 0448   Sepsis Labs Invalid input(s): PROCALCITONIN,  WBC,  LACTICIDVEN Microbiology No results found for this or any previous visit (from the past 240 hour(s)).   Time coordinating discharge:  I have spent 35 minutes  face to face with the patient and on the ward discussing the patients care, assessment, plan and disposition with other care givers. >50% of the time was devoted counseling the patient about the risks and benefits of treatment/Discharge disposition and coordinating care.   SIGNED:   Damita Lack, MD  [facility] 07/19/2018, 10:41 AM Pager   If 7PM-7AM, please contact night-coverage [email or web address] Password [email or web address]

## 2018-07-19 NOTE — Consult Note (Signed)
[facility] Renal Consultation Note    Indication for Consultation:  Management of ESRD/hemodialysis; anemia, hypertension/volume and secondary hyperparathyroidism PCP:  HPI: Jordan [name] is a 52 y.o. male with ESRD on hemodialysis MWF at [facility]. PMH of HTN, ETOH abuse, tobacco abuse, AOCD, SHPT. Last HD 07/14/18 left 0.4 kg under OP EDW, ran full tx.   Patient presented to ED with C/O SOB and itching 07/18/2018. Patient states itching started early Friday-said he didn't think he could sit still long enough to complete a treatment. He said he had rash on chest which has now resolved. He was hypertensive on arrival, CXR showed diffuse pulmonary edema with confluent airspace opacities R lung mostly likely pulmonary edema but superimposed PNA difficult to exclude. WBC 10.2 HGB 9.5 K+ 5.1 SCr 19.14 BUN 76> He has been admitted for pulmonary edema and hemodialysis. He can possibly be discharged home post HD.   Seen on HD, no C/O itching/SOB at present. He denies fever, chills, chest pain, SOB prior to Sunday. Says he believes he is itching because his phosphorous is high. Denies recent allergen exposures, ABX or new medications.    Past Medical History:  Diagnosis Date  . Anemia   . CKD (chronic kidney disease)    Stage 5  Dialysis - M/W/F  . ETOH abuse   . Hypertension    Past Surgical History:  Procedure Laterality Date  . BASCILIC VEIN TRANSPOSITION Left 08/07/2016   Procedure: LEFT BASILIC VEIN TRANSPOSITION;  Surgeon: Angelia Mould, MD;  Location: [place];  Service: Vascular;  Laterality: Left;  . COLONOSCOPY N/A 08/12/2016   Procedure: COLONOSCOPY;  Surgeon: Otis Brace, MD;  Location: [place];  Service: Gastroenterology;  Laterality: N/A;  . ESOPHAGOGASTRODUODENOSCOPY N/A 08/12/2016   Procedure: ESOPHAGOGASTRODUODENOSCOPY (EGD);  Surgeon: Otis Brace, MD;  Location: [place];  Service: Gastroenterology;  Laterality: N/A;  .  INSERTION OF DIALYSIS CATHETER N/A 08/07/2016   Procedure: INSERTION OF TUNNELED DIALYSIS CATHETER;  Surgeon: Angelia Mould, MD;  Location: [place];  Service: Vascular;  Laterality: N/A;  . LIGATION OF ARTERIOVENOUS  FISTULA Left 09/12/2016   Procedure: BANDING OF LEFT  ARTERIOVENOUS  FISTULA;  Surgeon: Angelia Mould, MD;  Location[facility] OR;  Service: Vascular;  Laterality: Left;  . NO PAST SURGERIES     Family History  Problem Relation Age of Onset  . Diabetes Mother   . Kidney failure Mother   . Kidney failure Brother   . Post-traumatic stress disorder Neg Hx   . Bladder Cancer Neg Hx   . Kidney cancer Neg Hx    Social History:  reports that he has been smoking cigarettes. He has a 0.75 pack-year smoking history. He has never used smokeless tobacco. He reports that he drinks about 21.0 standard drinks of alcohol per week. He reports that he does not use drugs. No Known Allergies Prior to Admission medications   Medication Sig Start Date End Date Taking? Authorizing Provider  acetaminophen (TYLENOL) 325 MG tablet Take 2 tablets (650 mg total) by mouth every 6 (six) hours as needed for mild pain (or Fever >/= 101). 05/17/18   Cherene Altes, MD  amLODipine (NORVASC) 10 MG tablet Take 1 tablet (10 mg total) by mouth daily. 05/17/18   Cherene Altes, MD  calcium acetate (PHOSLO) 667 MG capsule Take 1,334 mg by mouth 3 (three) times daily with meals.    [provider]  Darbepoetin Alfa (ARANESP) 60 MCG/0.3ML SOSY injection Inject 0.3 mLs (  60 mcg total) into the vein every Monday with hemodialysis. 05/24/18   Cherene Altes, MD  folic acid (FOLVITE) 1 MG tablet Take 1 tablet (1 mg total) by mouth daily. 03/19/18   Alphonzo Grieve, MD  hydrOXYzine (ATARAX/VISTARIL) 50 MG tablet Take 50 mg by mouth as needed. 12/25/17   [provider]  RENVELA 800 MG tablet Take 800 mg by mouth 2 (two) times daily. 01/06/18   [provider]  thiamine 100 MG tablet  Take 1 tablet (100 mg total) by mouth daily. 03/19/18   Alphonzo Grieve, MD  triamcinolone cream (KENALOG) 0.1 %  07/13/18   [provider]   Current Facility-Administered Medications  Medication Dose Route Frequency Provider Last Rate Last Dose  . 0.9 %  sodium chloride infusion  100 mL Intravenous PRN Elmarie Shiley, MD      . 0.9 %  sodium chloride infusion  100 mL Intravenous PRN Elmarie Shiley, MD      . acetaminophen (TYLENOL) tablet 650 mg  650 mg Oral Q6H PRN Elwyn Reach, MD      . amLODipine (NORVASC) tablet 10 mg  10 mg Oral Daily Gala Romney L, MD      . calcium acetate (PHOSLO) capsule 1,334 mg  1,334 mg Oral TID WC Elwyn Reach, MD      . Chlorhexidine Gluconate Cloth 2 % PADS 6 each  6 each Topical Q0600 Elmarie Shiley, MD      . folic acid (FOLVITE) tablet 1 mg  1 mg Oral Daily Elwyn Reach, MD      . heparin injection 4,000 Units  4,000 Units Dialysis PRN Elmarie Shiley, MD      . heparin injection 5,000 Units  5,000 Units Subcutaneous Q8H Gala Romney L, MD      . hydrOXYzine (ATARAX/VISTARIL) tablet 50 mg  50 mg Oral PRN Elwyn Reach, MD   50 mg at 07/19/18 0108  . lidocaine (PF) (XYLOCAINE) 1 % injection 5 mL  5 mL Intradermal PRN Elmarie Shiley, MD      . lidocaine-prilocaine (EMLA) cream 1 application  1 application Topical PRN Elmarie Shiley, MD      . nicotine (NICODERM CQ - dosed in mg/24 hours) patch 21 mg  21 mg Transdermal Daily Jonelle Sidle, Mohammad L, MD      . ondansetron (ZOFRAN) tablet 4 mg  4 mg Oral Q6H PRN Elwyn Reach, MD       Or  . ondansetron (ZOFRAN) injection 4 mg  4 mg Intravenous Q6H PRN Elwyn Reach, MD      . pentafluoroprop-tetrafluoroeth (GEBAUERS) aerosol 1 application  1 application Topical PRN Elmarie Shiley, MD      . sevelamer carbonate (RENVELA) tablet 800 mg  800 mg Oral BID WC Jonelle Sidle, Mohammad L, MD      . thiamine (VITAMIN B-1) tablet 100 mg  100 mg Oral Daily Elwyn Reach, MD       Labs: Basic Metabolic Panel: Recent  Labs  Lab 07/18/18 2025 07/18/18 2110 07/19/18 0003 07/19/18 0349  NA 138 140  --  139  K 5.0 5.1  --  4.9  CL 100 100  --  99  CO2  --  23  --  21*  GLUCOSE 94 93  --  112*  BUN 77* 76*  --  83*  CREATININE >18.00* 19.14* 19.74* 20.49*  CALCIUM  --  9.6  --  9.4  PHOS  --  6.1*  --   --  Liver Function Tests: Recent Labs  Lab 07/18/18 2110 07/19/18 0349  AST 16 14*  ALT 11 11  ALKPHOS 55 52  BILITOT 0.9 1.0  PROT 7.6 7.3  ALBUMIN 3.5 3.6   No results for input(s): LIPASE, AMYLASE in the last 168 hours. No results for input(s): AMMONIA in the last 168 hours. CBC: Recent Labs  Lab 07/18/18 2110 07/19/18 0003 07/19/18 0349  WBC 10.2 12.7* 9.5  NEUTROABS 7.0  --   --   HGB 9.5* 9.1* 9.1*  HCT 29.5* 29.1* 29.8*  MCV 101.0* 101.0* 101.7*  PLT 169 190 163   Cardiac Enzymes: No results for input(s): CKTOTAL, CKMB, CKMBINDEX, TROPONINI in the last 168 hours. CBG: No results for input(s): GLUCAP in the last 168 hours. Iron Studies: No results for input(s): IRON, TIBC, TRANSFERRIN, FERRITIN in the last 72 hours. Studies/Results: Dg Chest Port 1 View  Result Date: 07/18/2018 CLINICAL DATA:  Itching all over since Thursday. Patient unable to go to dialysis due to feeling poorly. Swelling beneath the thighs. Dyspnea and cough today. EXAM: PORTABLE CHEST 1 VIEW COMPARISON:  05/17/2018 FINDINGS: Stable borderline cardiomegaly with moderate aortic atherosclerosis. No aneurysm. Diffuse pulmonary edema with more confluent opacities at the right greater than left lung base. No effusion or pneumothorax. No acute nor suspicious osseous abnormalities. IMPRESSION: 1. Stable cardiomegaly with aortic atherosclerosis. 2. Redemonstration of what appears to be diffuse pulmonary edema possibly noncardiogenic given history of dialysis. There are more confluent airspace opacities noted at the right lung base, more likely related to the edema however superimposed pneumonia would be difficult to  entirely exclude. Electronically Signed   By: Ashley Royalty M.D.   On: 07/18/2018 22:17    ROS: As per HPI otherwise negative.   Physical Exam: Vitals:   07/19/18 0800 07/19/18 0830 07/19/18 0900 07/19/18 0930  BP: (!) 155/85 (!) 142/82 134/75 137/81  Pulse: (!) 104 90 92 92  Resp:      Temp:      TempSrc:      SpO2: 100% 100% 100%      General: Well developed, well nourished, in no acute distress. Head: Normocephalic, atraumatic, sclera non-icteric, mucus membranes are moist Neck: Supple. JVD not elevated. Lungs: Clear bilaterally to auscultation without wheezes, rales, or rhonchi. Breathing is unlabored. Heart: RRR with S1 S2. No murmurs, rubs, or gallops appreciated. Abdomen: Soft, non-tender, non-distended with normoactive bowel sounds. No rebound/guarding. No obvious abdominal masses. M-S:  Strength and tone appear normal for age. Lower extremities:without edema or ischemic changes, no open wounds  Neuro: Alert and oriented X 3. Moves all extremities spontaneously. Psych:  Responds to questions appropriately with a normal affect. Dialysis Access: LUA AVF cannulated at present.   Dialysis Orders: [facility] MWF 4 hrs 180NRe 450/800 68.5 kg 2.0 K/ 2.25 Ca  -Heparin 2000 units IV -Parsabiv 5 mg IV TIW -Calcitriol 2.25 mcg PO TIW  Assessment/Plan: 1.  SOB/Pulmonary edema-missed HD 07/16/18. Usually compliant with HD. UFG 4.5 liters. Lower EDW to 68 kg.  2. Itching-no visible rash. Last Phos 10.7-probably playing a role. Reminded to take binders, Atarax/Solumedrol 125 mg IV per primary.  3.  ESRD -  MWF. HD today on schedule. K+ 4.9 4.  Hypertension/volume  - BP controlled. No LE edema.  5.  Anemia  - HGB 9.1. Last HGB 10.3 at OP HD center 07/14/18. Give Aranesp 40 mcg IV today, follow HGB 6.  Metabolic bone disease - RFP added to today's labs. Parsabiv not available on formulary. Continue  binders, VDRA.  7.  Nutrition - Albumin 3.6 Renal diet, renal vit, nepro  Disposition:  Discussed with primary. Stable for DC post HD.   Rita H. Owens Shark, NP-C 07/19/2018, 9:56 AM  [employer] [phone]

## 2018-07-19 NOTE — ED Notes (Signed)
Pt called out complaining he was itching again.

## 2018-07-19 NOTE — Progress Notes (Signed)
Paged Dr Reesa Chew to verify when Pt should be discharged. Per Amin Pt was to be discharged from Dialysis after being walked down hall to make sure Pt didn't become short of breath.  Per Reesa Chew, dialysis stated that dialysis could not discharge pt from there, that the pt would have to be sent to the floor and then discharged.   This RN will discharge pt per orders of Physician.

## 2018-09-09 ENCOUNTER — Other Ambulatory Visit: Payer: Self-pay

## 2018-09-09 ENCOUNTER — Emergency Department (HOSPITAL_COMMUNITY)
Admission: EM | Admit: 2018-09-09 | Discharge: 2018-09-10 | Disposition: A | Discharge status: Home / Self Care | Payer: Medicare Other | Attending: Emergency Medicine | Admitting: Emergency Medicine

## 2018-09-09 ENCOUNTER — Encounter (HOSPITAL_COMMUNITY): Payer: Self-pay

## 2018-09-09 DIAGNOSIS — F17210 Nicotine dependence, cigarettes, uncomplicated: Secondary | ICD-10-CM | POA: Insufficient documentation

## 2018-09-09 DIAGNOSIS — I10 Essential (primary) hypertension: Secondary | ICD-10-CM | POA: Diagnosis not present

## 2018-09-09 DIAGNOSIS — L299 Pruritus, unspecified: Secondary | ICD-10-CM | POA: Diagnosis present

## 2018-09-09 DIAGNOSIS — Z79899 Other long term (current) drug therapy: Secondary | ICD-10-CM | POA: Diagnosis not present

## 2018-09-09 NOTE — ED Triage Notes (Signed)
Pt here with complaint of persistent itching for the last day.  Pt gets dialysis M/W/F and received whole treatment yesterday.  A&Ox4, no shortness of breath or any other complaints.

## 2018-09-10 DIAGNOSIS — L299 Pruritus, unspecified: Secondary | ICD-10-CM | POA: Diagnosis not present

## 2018-09-10 LAB — CBC WITH DIFFERENTIAL/PLATELET
Abs Immature Granulocytes: 0.03 10*3/uL (ref 0.00–0.07)
Basophils Absolute: 0 10*3/uL (ref 0.0–0.1)
Basophils Relative: 0 %
Eosinophils Absolute: 0.8 10*3/uL — ABNORMAL HIGH (ref 0.0–0.5)
Eosinophils Relative: 8 %
HCT: 35 % — ABNORMAL LOW (ref 39.0–52.0)
Hemoglobin: 11.4 g/dL — ABNORMAL LOW (ref 13.0–17.0)
Immature Granulocytes: 0 %
Lymphocytes Relative: 16 %
Lymphs Abs: 1.7 10*3/uL (ref 0.7–4.0)
MCH: 32.9 pg (ref 26.0–34.0)
MCHC: 32.6 g/dL (ref 30.0–36.0)
MCV: 101.2 fL — ABNORMAL HIGH (ref 80.0–100.0)
Monocytes Absolute: 1.1 10*3/uL — ABNORMAL HIGH (ref 0.1–1.0)
Monocytes Relative: 11 %
Neutro Abs: 6.7 10*3/uL (ref 1.7–7.7)
Neutrophils Relative %: 65 %
Platelets: 182 10*3/uL (ref 150–400)
RBC: 3.46 MIL/uL — ABNORMAL LOW (ref 4.22–5.81)
RDW: 14.4 % (ref 11.5–15.5)
WBC: 10.4 10*3/uL (ref 4.0–10.5)
nRBC: 0 % (ref 0.0–0.2)

## 2018-09-10 LAB — COMPREHENSIVE METABOLIC PANEL
ALT: 13 U/L (ref 0–44)
AST: 17 U/L (ref 15–41)
Albumin: 3.6 g/dL (ref 3.5–5.0)
Alkaline Phosphatase: 54 U/L (ref 38–126)
Anion gap: 19 — ABNORMAL HIGH (ref 5–15)
BUN: 32 mg/dL — ABNORMAL HIGH (ref 6–20)
CO2: 29 mmol/L (ref 22–32)
Calcium: 10.9 mg/dL — ABNORMAL HIGH (ref 8.9–10.3)
Chloride: 92 mmol/L — ABNORMAL LOW (ref 98–111)
Creatinine, Ser: 12.77 mg/dL — ABNORMAL HIGH (ref 0.61–1.24)
GFR calc Af Amer: 5 mL/min — ABNORMAL LOW (ref >60)
GFR calc non Af Amer: 4 mL/min — ABNORMAL LOW (ref >60)
Glucose, Bld: 97 mg/dL (ref 70–99)
Potassium: 3.9 mmol/L (ref 3.5–5.1)
Sodium: 140 mmol/L (ref 135–145)
Total Bilirubin: 0.8 mg/dL (ref 0.3–1.2)
Total Protein: 7.4 g/dL (ref 6.5–8.1)

## 2018-09-10 MED ORDER — HYDROXYZINE HCL 25 MG PO TABS
50.0000 mg | ORAL_TABLET | Freq: Once | ORAL | Status: AC
  Administered 2018-09-10: 50 mg via ORAL
  Filled 2018-09-10: qty 2

## 2018-09-10 MED ORDER — HYDROXYZINE HCL 25 MG PO TABS: 50.0000 mg | ORAL_TABLET | Freq: Three times a day (TID) | ORAL | 0 refills | Status: DC | PRN

## 2018-09-10 MED ORDER — DIPHENHYDRAMINE HCL 25 MG PO CAPS
50.0000 mg | ORAL_CAPSULE | Freq: Once | ORAL | Status: DC
  Filled 2018-09-10: qty 2

## 2018-09-10 NOTE — Discharge Instructions (Signed)
Please follow-up with your nephrologist as an outpatient.  Your labs today were normal.

## 2018-09-10 NOTE — ED Notes (Signed)
Discharge instructions discussed with patient. No further questions at this time. Patient ambulatory, A&O x 4 at discharge.

## 2018-09-10 NOTE — ED Provider Notes (Addendum)
TIME SEEN: 12:38 AM  CHIEF COMPLAINT: Itching  HPI: Patient is a 52 year old male with history of end-stage renal disease on hemodialysis Monday, Wednesday, Friday, hypertension, alcohol abuse who presents to the emergency department with diffuse itching for the past few days.  It appears he has had similar symptoms in the past.  States when he was seen here in October he had similar symptoms and states he thinks he was given a steroid which helped his symptoms.  It appears he was given Atarax.  No rash.  No fever.  He has not missed any dialysis but states he has had to have shorter dialysis sessions secondary to intense itching during dialysis.  He states he has talked to his nephrologist about this but cannot recall what they have told him.  No new soaps, lotions, detergents or medications.  No chest pain or shortness of breath.  No vomiting or diarrhea.  ROS: See HPI Constitutional: no fever  Eyes: no drainage  ENT: no runny nose   Cardiovascular:  no chest pain  Resp: no SOB  GI: no vomiting GU: no dysuria Integumentary: no rash  Allergy: no hives  Musculoskeletal: no leg swelling  Neurological: no slurred speech ROS otherwise negative  PAST MEDICAL HISTORY/PAST SURGICAL HISTORY:  Past Medical History:  Diagnosis Date  . Anemia   . CKD (chronic kidney disease)    Stage 5  Dialysis - M/W/F  . ETOH abuse   . Hypertension     MEDICATIONS:  Prior to Admission medications   Medication Sig Start Date End Date Taking? Authorizing Provider  acetaminophen (TYLENOL) 325 MG tablet Take 2 tablets (650 mg total) by mouth every 6 (six) hours as needed for mild pain (or Fever >/= 101). 05/17/18   Cherene Altes, MD  amLODipine (NORVASC) 10 MG tablet Take 1 tablet (10 mg total) by mouth daily. 05/17/18   Cherene Altes, MD  calcium acetate (PHOSLO) 667 MG capsule Take 1,334 mg by mouth 3 (three) times daily with meals.    [provider]  Darbepoetin Alfa (ARANESP) 60  MCG/0.3ML SOSY injection Inject 0.3 mLs (60 mcg total) into the vein every Monday with hemodialysis. 05/24/18   Cherene Altes, MD  folic acid (FOLVITE) 1 MG tablet Take 1 tablet (1 mg total) by mouth daily. 03/19/18   Alphonzo Grieve, MD  hydrOXYzine (ATARAX/VISTARIL) 50 MG tablet Take 1 tablet (50 mg total) by mouth every 6 (six) hours as needed for itching. 07/19/18   Amin, Ankit Chirag, MD  RENVELA 800 MG tablet Take 800 mg by mouth 2 (two) times daily. 01/06/18   [provider]  thiamine 100 MG tablet Take 1 tablet (100 mg total) by mouth daily. 03/19/18   Alphonzo Grieve, MD  triamcinolone cream (KENALOG) 0.1 %  07/13/18   [provider]    ALLERGIES:  No Known Allergies  SOCIAL HISTORY:  Social History   Tobacco Use  . Smoking status: Current Every Day Smoker    Packs/day: 0.05    Years: 15.00    Pack years: 0.75    Types: Cigarettes  . Smokeless tobacco: Never Used  . Tobacco comment: down to 1 a day  Substance Use Topics  . Alcohol use: Yes    Alcohol/week: 21.0 standard drinks    Types: 21 Cans of beer per week    Comment: none since 08/04/16    FAMILY HISTORY: Family History  Problem Relation Age of Onset  . Diabetes Mother   . Kidney  failure Mother   . Kidney failure Brother   . Post-traumatic stress disorder Neg Hx   . Bladder Cancer Neg Hx   . Kidney cancer Neg Hx     EXAM: BP (!) 187/99   Pulse 100   Temp 97.9 F (36.6 C) (Oral)   Resp 17   SpO2 100%  CONSTITUTIONAL: Alert and oriented and responds appropriately to questions. Well-appearing; well-nourished HEAD: Normocephalic EYES: Conjunctivae clear, pupils appear equal, EOMI, no scleral icterus ENT: normal nose; moist mucous membranes NECK: Supple, no meningismus, no nuchal rigidity, no LAD  CARD: RRR; S1 and S2 appreciated; no murmurs, no clicks, no rubs, no gallops RESP: Normal chest excursion without splinting or tachypnea; breath sounds clear and equal bilaterally; no  wheezes, no rhonchi, no rales, no hypoxia or respiratory distress, speaking full sentences ABD/GI: Normal bowel sounds; non-distended; soft, non-tender, no rebound, no guarding, no peritoneal signs, no hepatosplenomegaly BACK:  The back appears normal and is non-tender to palpation, there is no CVA tenderness EXT: Normal ROM in all joints; non-tender to palpation; no edema; normal capillary refill; no cyanosis, no calf tenderness or swelling    SKIN: Normal color for age and race; warm; no rash, patient has some scarring and excoriation from scratching but no rash noted, no urticaria, no petechiae or purpura, no blisters or desquamation, no calciphylaxis noted NEURO: Moves all extremities equally PSYCH: The patient's mood and manner are appropriate. Grooming and personal hygiene are appropriate.  MEDICAL DECISION MAKING: Patient here with itching that is worse after dialysis.  He has no rash on examination.  Does have a history of alcohol abuse and therefore will obtain labs to check a total bilirubin level, liver function test to ensure this is not the cause of his itching.  He denies missing any dialysis recently and has no chest pain or shortness of breath.  I do not feel he needs to be on steroids at this time.  Will give Atarax for symptomatic relief.  ED PROGRESS: Patient's labs show no acute abnormality.  I feel he is safe to be discharged and follow-up with his outpatient doctors.  It appears Atarax has helped him in the past.  Will discharge with prescription of the same.   Patient states he is frustrated because he is worried that with dialysis tomorrow he will begin itching again.  Discussed with him that there is no sign of any life-threatening illness or injury present today and that he will need to follow-up as an outpatient for this.  He has not been scratching over the past hour and is resting comfortably currently.  Again there is no sign of any rash on examination, infectious etiology,  other systemic symptoms.  No new exposures.  I do not feel steroids, epinephrine would be beneficial.  I feel he needs symptomatic treatment and close outpatient follow-up.  He verbalized understanding.   At this time, I do not feel there is any life-threatening condition present. I have reviewed and discussed all results (EKG, imaging, lab, urine as appropriate) and exam findings with patient/family. I have reviewed nursing notes and appropriate previous records.  I feel the patient is safe to be discharged home without further emergent workup and can continue workup as an outpatient as needed. Discussed usual and customary return precautions. Patient/family verbalize understanding and are comfortable with this plan.  Outpatient follow-up has been provided as needed. All questions have been answered.      Hopelynn Gartland, Delice Bison, DO 09/10/18 [phone]  Iyanni Hepp, Delice Bison, DO 09/10/18 [phone]

## 2018-09-10 NOTE — ED Notes (Signed)
Patient refused last set of vitals. States "this was a waste of time. I'm still itching. I can't go to dialysis like this, I can't stand it". Informed patient I had Benadryl for him to take but he refused it because "it doesn't work".

## 2018-10-03 ENCOUNTER — Emergency Department (HOSPITAL_COMMUNITY)
Admission: EM | Admit: 2018-10-03 | Discharge: 2018-10-03 | Disposition: A | Discharge status: Home / Self Care | Payer: Medicare Other | Attending: Emergency Medicine | Admitting: Emergency Medicine

## 2018-10-03 ENCOUNTER — Encounter (HOSPITAL_COMMUNITY): Payer: Self-pay | Admitting: Emergency Medicine

## 2018-10-03 ENCOUNTER — Emergency Department (HOSPITAL_COMMUNITY): Payer: Medicare Other

## 2018-10-03 DIAGNOSIS — Z992 Dependence on renal dialysis: Secondary | ICD-10-CM | POA: Insufficient documentation

## 2018-10-03 DIAGNOSIS — R0602 Shortness of breath: Secondary | ICD-10-CM | POA: Diagnosis present

## 2018-10-03 DIAGNOSIS — Z79899 Other long term (current) drug therapy: Secondary | ICD-10-CM | POA: Diagnosis not present

## 2018-10-03 DIAGNOSIS — N185 Chronic kidney disease, stage 5: Secondary | ICD-10-CM | POA: Insufficient documentation

## 2018-10-03 DIAGNOSIS — I120 Hypertensive chronic kidney disease with stage 5 chronic kidney disease or end stage renal disease: Secondary | ICD-10-CM | POA: Diagnosis not present

## 2018-10-03 DIAGNOSIS — J40 Bronchitis, not specified as acute or chronic: Secondary | ICD-10-CM | POA: Diagnosis not present

## 2018-10-03 DIAGNOSIS — F17210 Nicotine dependence, cigarettes, uncomplicated: Secondary | ICD-10-CM | POA: Diagnosis not present

## 2018-10-03 LAB — CBC WITH DIFFERENTIAL/PLATELET
Abs Immature Granulocytes: 0.05 10*3/uL (ref 0.00–0.07)
Basophils Absolute: 0 10*3/uL (ref 0.0–0.1)
Basophils Relative: 0 %
Eosinophils Absolute: 0.7 10*3/uL — ABNORMAL HIGH (ref 0.0–0.5)
Eosinophils Relative: 7 %
HCT: 25 % — ABNORMAL LOW (ref 39.0–52.0)
Hemoglobin: 8.2 g/dL — ABNORMAL LOW (ref 13.0–17.0)
Immature Granulocytes: 1 %
Lymphocytes Relative: 15 %
Lymphs Abs: 1.6 10*3/uL (ref 0.7–4.0)
MCH: 34.2 pg — ABNORMAL HIGH (ref 26.0–34.0)
MCHC: 32.8 g/dL (ref 30.0–36.0)
MCV: 104.2 fL — ABNORMAL HIGH (ref 80.0–100.0)
Monocytes Absolute: 0.5 10*3/uL (ref 0.1–1.0)
Monocytes Relative: 5 %
Neutro Abs: 7.3 10*3/uL (ref 1.7–7.7)
Neutrophils Relative %: 72 %
Platelets: 173 10*3/uL (ref 150–400)
RBC: 2.4 MIL/uL — ABNORMAL LOW (ref 4.22–5.81)
RDW: 14.6 % (ref 11.5–15.5)
WBC: 10.2 10*3/uL (ref 4.0–10.5)
nRBC: 0 % (ref 0.0–0.2)

## 2018-10-03 LAB — I-STAT TROPONIN, ED: Troponin i, poc: 0.04 ng/mL (ref 0.00–0.08)

## 2018-10-03 LAB — BASIC METABOLIC PANEL
Anion gap: 21 — ABNORMAL HIGH (ref 5–15)
BUN: 64 mg/dL — ABNORMAL HIGH (ref 6–20)
CO2: 22 mmol/L (ref 22–32)
Calcium: 9.2 mg/dL (ref 8.9–10.3)
Chloride: 94 mmol/L — ABNORMAL LOW (ref 98–111)
Creatinine, Ser: 16.49 mg/dL — ABNORMAL HIGH (ref 0.61–1.24)
GFR calc Af Amer: 3 mL/min — ABNORMAL LOW (ref >60)
GFR calc non Af Amer: 3 mL/min — ABNORMAL LOW (ref >60)
Glucose, Bld: 115 mg/dL — ABNORMAL HIGH (ref 70–99)
Potassium: 4.5 mmol/L (ref 3.5–5.1)
Sodium: 137 mmol/L (ref 135–145)

## 2018-10-03 IMAGING — CR DG CHEST 2V
2 series · 2 of 2 positions shown · non-contrast
Comparison: [DATE]

CLINICAL DATA: Intermittent shortness of breath and cough for 2
weeks.

EXAM:
CHEST - 2 VIEW

[chest lat]
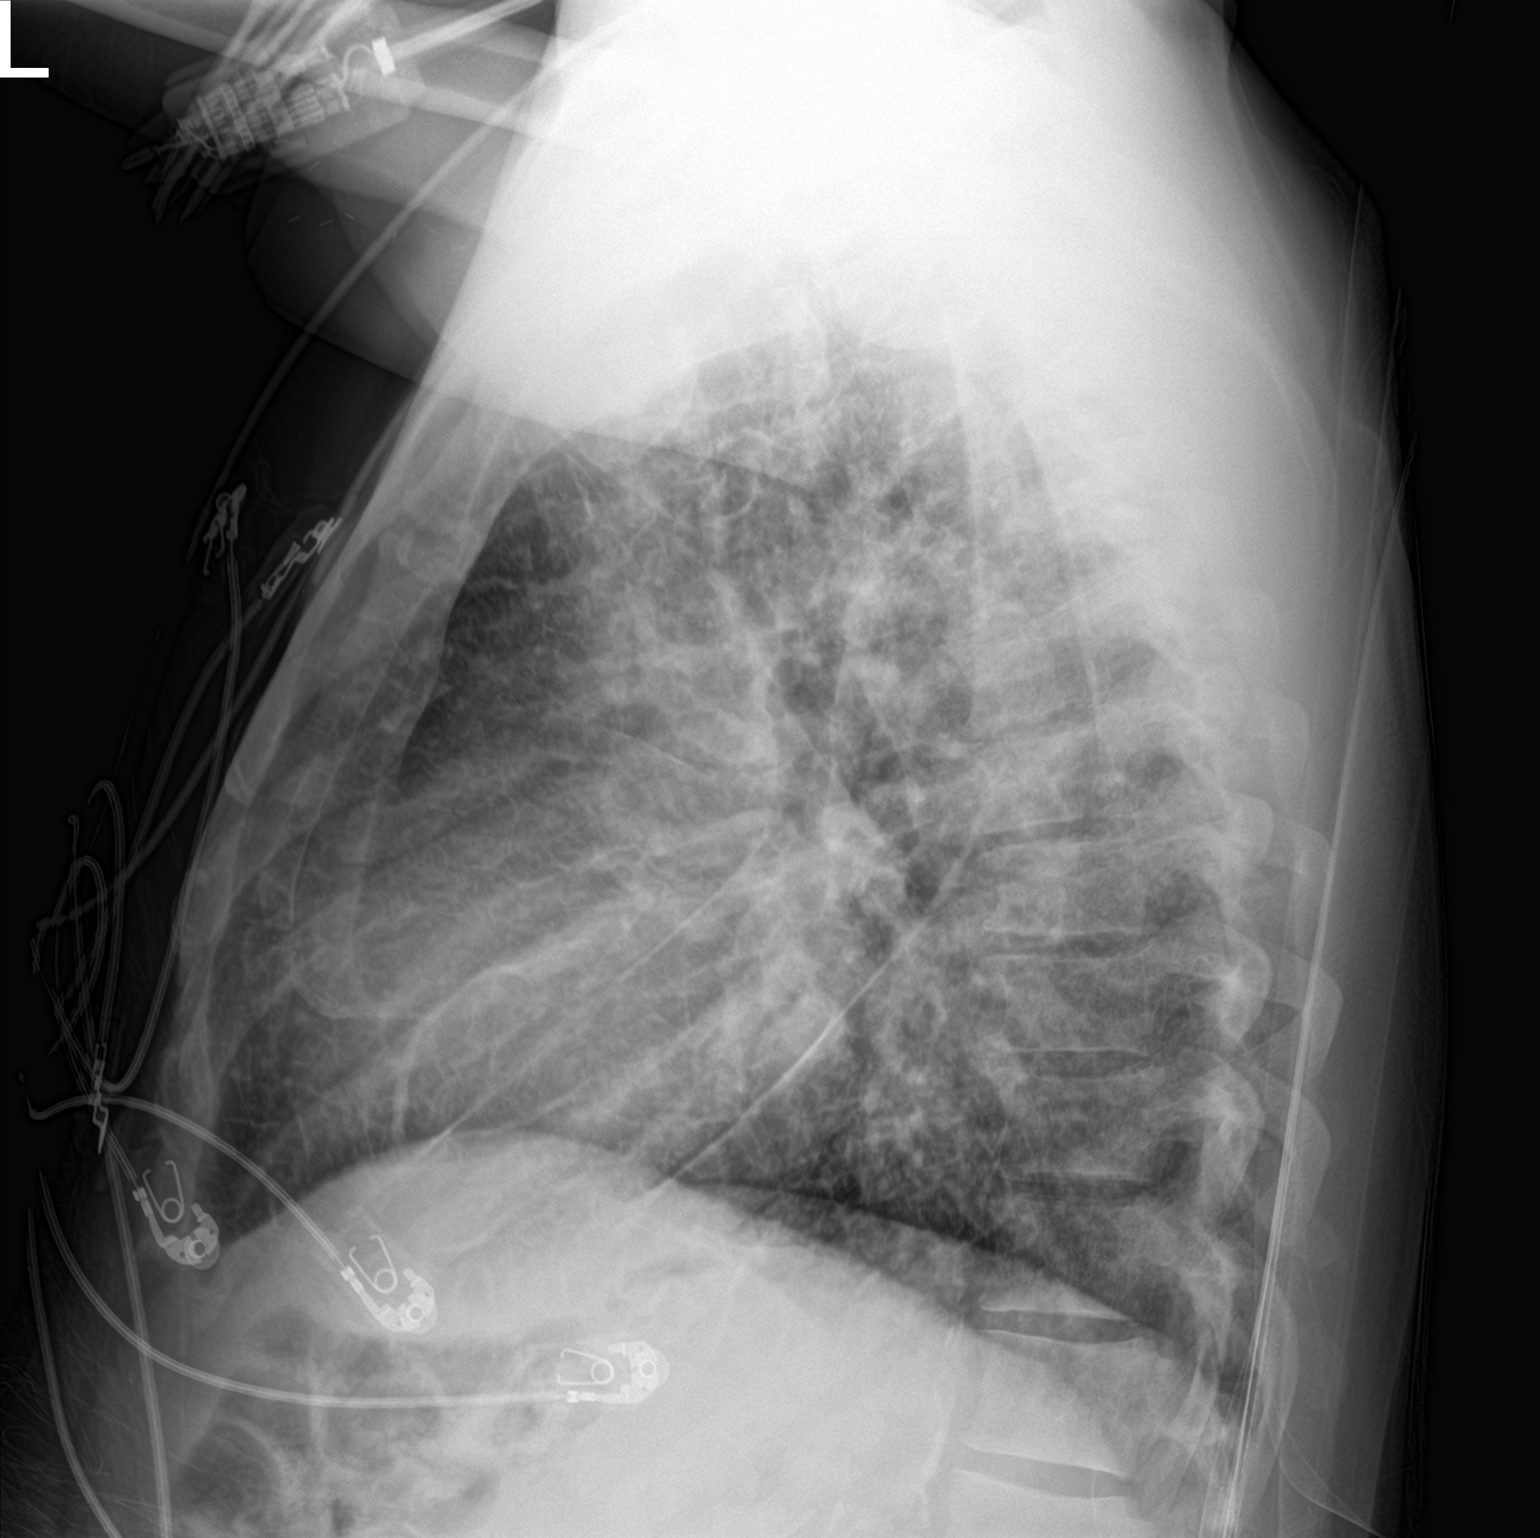

[chest ap]
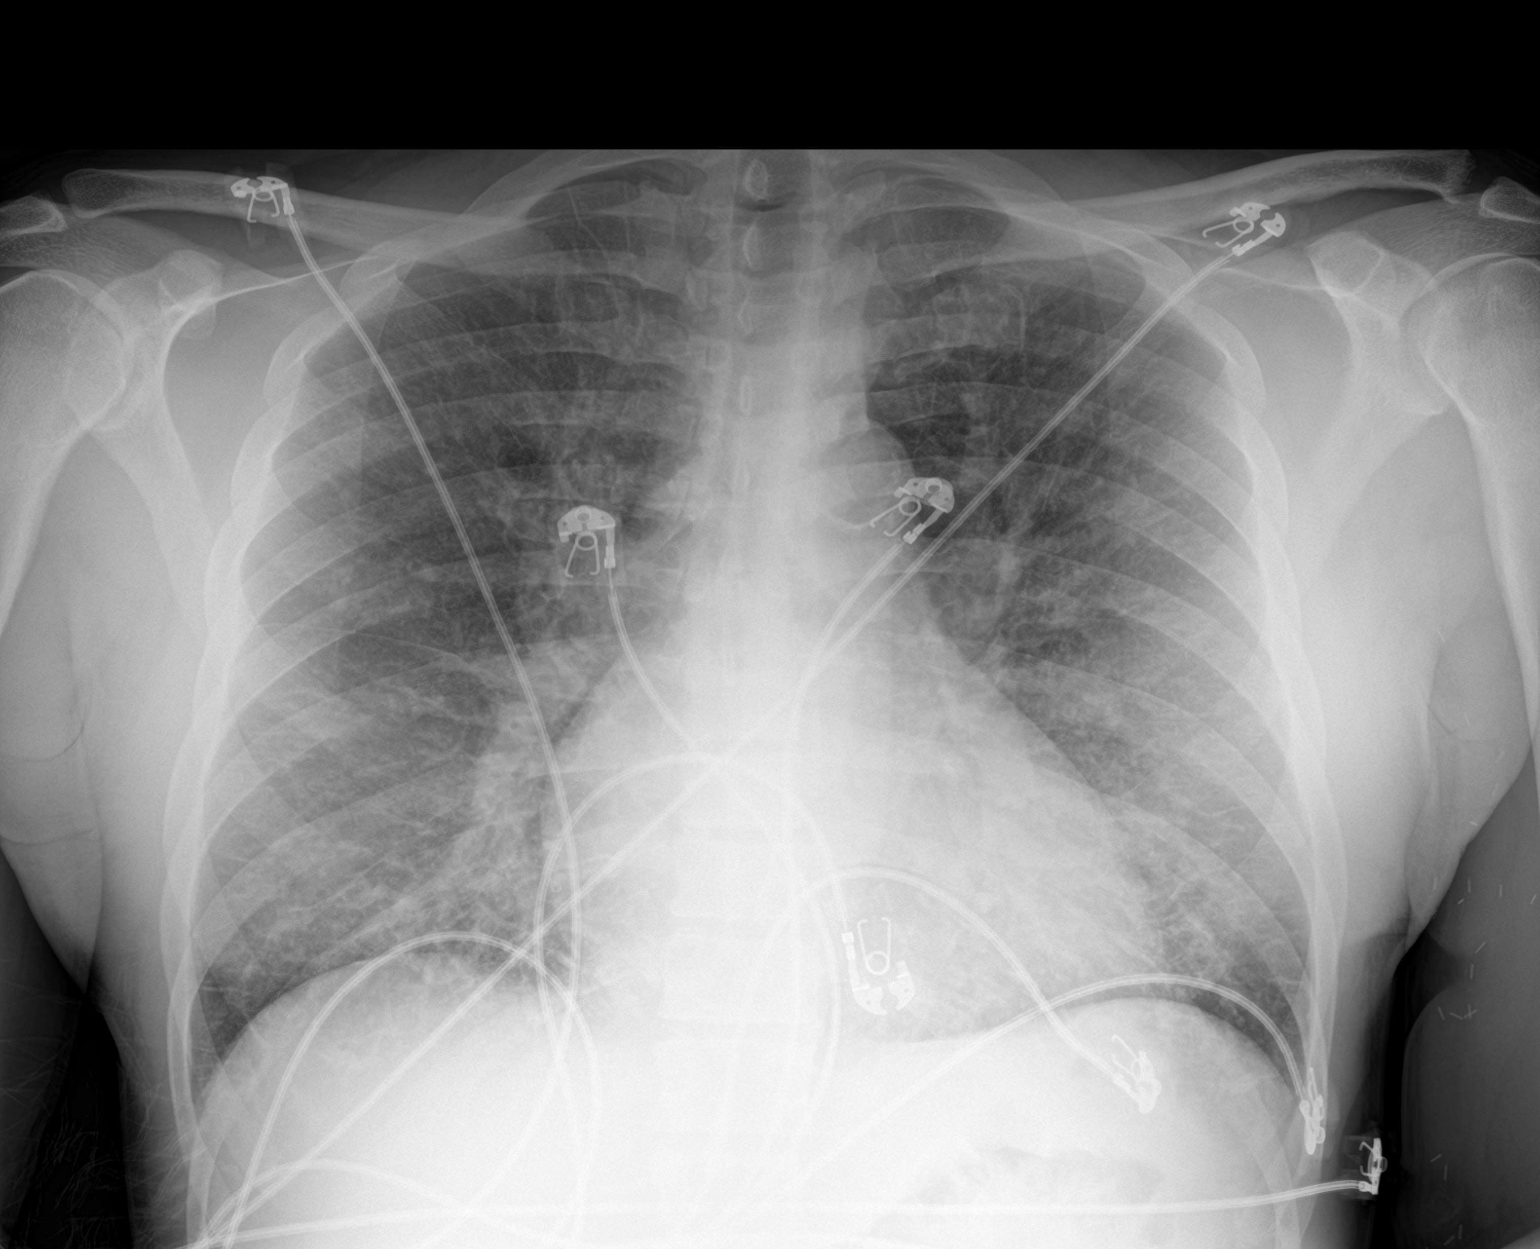

[2 of 2 positions shown; findings below may reference images not displayed]

FINDINGS: Cardiac enlargement with diffuse interstitial infiltration in the
lungs consistent with edema. No focal consolidation. No blunting of
costophrenic angles. No pneumothorax. Mediastinal contours appear
intact. Calcification of the aorta.
IMPRESSION: Cardiac enlargement with diffuse interstitial edema.

## 2018-10-03 MED ORDER — ALBUTEROL SULFATE HFA 108 (90 BASE) MCG/ACT IN AERS: 2.0000 | INHALATION_SPRAY | RESPIRATORY_TRACT | 2 refills | Status: DC | PRN

## 2018-10-03 MED ORDER — PREDNISONE 20 MG PO TABS: 40.0000 mg | ORAL_TABLET | Freq: Every day | ORAL | 0 refills | Status: DC

## 2018-10-03 MED ORDER — DIPHENHYDRAMINE HCL 25 MG PO CAPS
25.0000 mg | ORAL_CAPSULE | Freq: Once | ORAL | Status: AC
  Administered 2018-10-03: 25 mg via ORAL
  Filled 2018-10-03: qty 1

## 2018-10-03 MED ORDER — DOXYCYCLINE HYCLATE 100 MG PO CAPS: 100.0000 mg | ORAL_CAPSULE | Freq: Two times a day (BID) | ORAL | 0 refills | Status: DC

## 2018-10-03 MED ORDER — DIPHENHYDRAMINE HCL 50 MG/ML IJ SOLN: 25.0000 mg | Freq: Once | INTRAMUSCULAR | Status: DC

## 2018-10-03 NOTE — ED Provider Notes (Signed)
[facility] EMERGENCY DEPARTMENT Provider Note   CSN: [id number] Arrival date & time: 10/03/18  2992     History   Chief Complaint Chief Complaint  Patient presents with  . Shortness of Breath    HPI Jordan [name] is a 53 y.o. male.  Patient presents to the emergency department for evaluation of shortness of breath.  Patient reports that he has been experiencing cough that has been ongoing for 2 weeks.  Cough has progressed and is now productive of thick brown sputum.  Patient arrives in the ER by ambulance.  EMS report that he had diffuse wheezing throughout all lung fields, improved with nebulizer treatment.  Patient confirms this at arrival, reports that his breathing is much better.     Past Medical History:  Diagnosis Date  . Anemia   . CKD (chronic kidney disease)    Stage 5  Dialysis - M/W/F  . ETOH abuse   . Hypertension     Patient Active Problem List   Diagnosis Date Noted  . Pulmonary edema 07/18/2018  . Anemia in ESRD (end-stage renal disease) ([place]) 05/17/2018  . Hepatitis B 03/19/2018  . Encephalopathy 03/19/2018  . 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 History of macrocytic anemia 03/19/2018  . History of iron deficiency anemia 03/19/2018  . ESRD on dialysis ([place]) 05/26/2017  . Essential hypertension 05/26/2017  . Gastritis with hemorrhage 05/26/2017  . Tobacco abuse 08/04/2016  . ETOH abuse 08/04/2016  . Uncontrolled hypertension 08/04/2016  . Acute renal failure (ARF) ([place]) 08/04/2016  . Symptomatic anemia 08/04/2016    Past Surgical History:  Procedure Laterality Date  . BASCILIC VEIN TRANSPOSITION Left 08/07/2016   Procedure: LEFT BASILIC VEIN TRANSPOSITION;  Surgeon: Angelia Mould, MD;  Location: [facility];  Service: Vascular;  Laterality: Left;  . COLONOSCOPY N/A 08/12/2016   Procedure: COLONOSCOPY;  Surgeon: Otis Brace, MD;  Location: [facility];  Service: Gastroenterology;  Laterality: N/A;    . ESOPHAGOGASTRODUODENOSCOPY N/A 08/12/2016   Procedure: ESOPHAGOGASTRODUODENOSCOPY (EGD);  Surgeon: Otis Brace, MD;  Location: [facility];  Service: Gastroenterology;  Laterality: N/A;  . INSERTION OF DIALYSIS CATHETER N/A 08/07/2016   Procedure: INSERTION OF TUNNELED DIALYSIS CATHETER;  Surgeon: Angelia Mould, MD;  Location: [facility];  Service: Vascular;  Laterality: N/A;  . LIGATION OF ARTERIOVENOUS  FISTULA Left 09/12/2016   Procedure: BANDING OF LEFT  ARTERIOVENOUS  FISTULA;  Surgeon: Angelia Mould, MD;  Location: [facility];  Service: Vascular;  Laterality: Left;  . NO PAST SURGERIES          Home Medications    Prior to Admission medications   Medication Sig Start Date End Date Taking? Authorizing Provider  acetaminophen (TYLENOL) 325 MG tablet Take 2 tablets (650 mg total) by mouth every 6 (six) hours as needed for mild pain (or Fever >/= 101). 05/17/18   Cherene Altes, MD  albuterol (PROVENTIL HFA;VENTOLIN HFA) 108 (90 Base) MCG/ACT inhaler Inhale 2 puffs into the lungs every 4 (four) hours as needed for wheezing or shortness of breath. 10/03/18   Orpah Greek, MD  amLODipine (NORVASC) 10 MG tablet Take 1 tablet (10 mg total) by mouth daily. 05/17/18   Cherene Altes, MD  calcium acetate (PHOSLO) 667 MG capsule Take 1,334 mg by mouth 3 (three) times daily with meals.    [provider]  Darbepoetin Alfa (ARANESP) 60 MCG/0.3ML SOSY injection Inject 0.3 mLs (60 mcg total) into the vein every Monday  with hemodialysis. 05/24/18   Cherene Altes, MD  doxycycline (VIBRAMYCIN) 100 MG capsule Take 1 capsule (100 mg total) by mouth 2 (two) times daily. 10/03/18   Orpah Greek, MD  folic acid (FOLVITE) 1 MG tablet Take 1 tablet (1 mg total) by mouth daily. 03/19/18   Alphonzo Grieve, MD  hydrOXYzine (ATARAX/VISTARIL) 25 MG tablet Take 2 tablets (50 mg total) by mouth every 8 (eight) hours as needed. 09/10/18   Ward, Delice Bison, DO  predniSONE  (DELTASONE) 20 MG tablet Take 2 tablets (40 mg total) by mouth daily with breakfast. 10/03/18   Pollina, Gwenyth Allegra, MD  RENVELA 800 MG tablet Take 800 mg by mouth 2 (two) times daily. 01/06/18   [provider]  thiamine 100 MG tablet Take 1 tablet (100 mg total) by mouth daily. 03/19/18   Alphonzo Grieve, MD  triamcinolone cream (KENALOG) 0.1 %  07/13/18   [provider]    Family History Family History  Problem Relation Age of Onset  . Diabetes Mother   . Kidney failure Mother   . Kidney failure Brother   . Post-traumatic stress disorder Neg Hx   . Bladder Cancer Neg Hx   . Kidney cancer Neg Hx     Social History Social History   Tobacco Use  . Smoking status: Current Every Day Smoker    Packs/day: 0.05    Years: 15.00    Pack years: 0.75    Types: Cigarettes  . Smokeless tobacco: Never Used  . Tobacco comment: down to 1 a day  Substance Use Topics  . Alcohol use: Yes    Alcohol/week: 21.0 standard drinks    Types: 21 Cans of beer per week    Comment: none since 08/04/16  . Drug use: No     Allergies   Patient has no known allergies.   Review of Systems Review of Systems  Respiratory: Positive for cough, shortness of breath and wheezing.   Skin:       Itching, but no rash  All other systems reviewed and are negative.    Physical Exam Updated Vital Signs BP (!) 144/84   Pulse 98   Temp 98.6 F (37 C) (Oral)   Resp 18   SpO2 99%   Physical Exam Vitals signs and nursing note reviewed.  Constitutional:      General: He is not in acute distress.    Appearance: Normal appearance. He is well-developed.  HENT:     Head: Normocephalic and atraumatic.     Right Ear: Hearing normal.     Left Ear: Hearing normal.     Nose: Nose normal.  Eyes:     Conjunctiva/sclera: Conjunctivae normal.     Pupils: Pupils are equal, round, and reactive to light.  Neck:     Musculoskeletal: Normal range of motion and neck supple.  Cardiovascular:      Rate and Rhythm: Regular rhythm.     Heart sounds: S1 normal and S2 normal. No murmur. No friction rub. No gallop.   Pulmonary:     Effort: Pulmonary effort is normal. No respiratory distress.     Breath sounds: Wheezing present.  Chest:     Chest wall: No tenderness.  Abdominal:     General: Bowel sounds are normal.     Palpations: Abdomen is soft.     Tenderness: There is no abdominal tenderness. There is no guarding or rebound. Negative signs include Murphy's sign and McBurney's sign.  Hernia: No hernia is present.  Musculoskeletal: Normal range of motion.  Skin:    General: Skin is warm and dry.     Findings: No rash.  Neurological:     Mental Status: He is alert and oriented to person, place, and time.     GCS: GCS eye subscore is 4. GCS verbal subscore is 5. GCS motor subscore is 6.     Cranial Nerves: No cranial nerve deficit.     Sensory: No sensory deficit.     Coordination: Coordination normal.  Psychiatric:        Speech: Speech normal.        Behavior: Behavior normal.        Thought Content: Thought content normal.      ED Treatments / Results  Labs (all labs ordered are listed, but only abnormal results are displayed) Labs Reviewed  CBC WITH DIFFERENTIAL/PLATELET - Abnormal; Notable for the following components:      Result Value   RBC 2.40 (*)    Hemoglobin 8.2 (*)    HCT 25.0 (*)    MCV 104.2 (*)    MCH 34.2 (*)    Eosinophils Absolute 0.7 (*)    All other components within normal limits  BASIC METABOLIC PANEL - Abnormal; Notable for the following components:   Chloride 94 (*)    Glucose, Bld 115 (*)    BUN 64 (*)    Creatinine, Ser 16.49 (*)    GFR calc non Af Amer 3 (*)    GFR calc Af Amer 3 (*)    Anion gap 21 (*)    All other components within normal limits  I-STAT TROPONIN, ED    EKG EKG Interpretation  Date/Time:  Sunday October 03 2018 02:16:50 EST Ventricular Rate:  109 PR Interval:    QRS Duration: 83 QT Interval:  356 QTC  Calculation: 480 R Axis:   67 Text Interpretation:  Sinus tachycardia Borderline prolonged QT interval Confirmed by Orpah Greek [phone]) on 10/03/2018 2:19:11 AM   Radiology Dg Chest 2 View  Result Date: 10/03/2018 CLINICAL DATA:  Intermittent shortness of breath and cough for 2 weeks. EXAM: CHEST - 2 VIEW COMPARISON:  07/18/2018 FINDINGS: Cardiac enlargement with diffuse interstitial infiltration in the lungs consistent with edema. No focal consolidation. No blunting of costophrenic angles. No pneumothorax. Mediastinal contours appear intact. Calcification of the aorta. IMPRESSION: Cardiac enlargement with diffuse interstitial edema. Electronically Signed   By: Lucienne Capers M.D.   On: 10/03/2018 03:22    Procedures Procedures (including critical care time)  Medications Ordered in ED Medications  diphenhydrAMINE (BENADRYL) capsule 25 mg (has no administration in time range)     Initial Impression / Assessment and Plan / ED Course  I have reviewed the triage vital signs and the nursing notes.  Pertinent labs & imaging results that were available during my care of the patient were reviewed by me and considered in my medical decision making (see chart for details).     Patient presents to the emergency department for evaluation of difficulty breathing.  He reportedly had significant bronchospasm and wheezing for EMS but has improved with albuterol treatment.  He has been administered Solu-Medrol.  Patient confirms that he feels much better.  Patient is a dialysis patient.  He missed dialysis yesterday because he was itching.  Apparently this itching that he is complaining of is a chronic problem for him.  He does not have any rash notable on examination and this is  a chronic condition.  He asked for Benadryl and was given Benadryl for the itch.  Patient has done well, he is currently sleeping and breathing comfortably, oxygen saturation 99%.  He will not require  hospitalization for this.  No evidence of pneumonia.  Although his x-ray does show some interstitial edema, his electrolytes are normal and he does not have any extremitas that would require emergent dialysis today.  He can be discharged, treat for upper respiratory bronchitis and follow-up with dialysis tomorrow as scheduled.  He was given return precautions.  Final Clinical Impressions(s) / ED Diagnoses   Final diagnoses:  Bronchitis    ED Discharge Orders         Ordered    albuterol (PROVENTIL HFA;VENTOLIN HFA) 108 (90 Base) MCG/ACT inhaler  Every 4 hours PRN     10/03/18 0504    predniSONE (DELTASONE) 20 MG tablet  Daily with breakfast     10/03/18 0504    doxycycline (VIBRAMYCIN) 100 MG capsule  2 times daily     10/03/18 0505           Orpah Greek, MD 10/03/18 [phone]

## 2018-10-03 NOTE — ED Notes (Signed)
Had to wake pt up. Pt reports his breathing fine but complains of itching.

## 2018-10-03 NOTE — ED Notes (Signed)
Patient verbalizes understanding of discharge instructions. Opportunity for questioning and answers were provided. Armband removed by staff, pt discharged from ED home via POV.  

## 2018-10-03 NOTE — ED Triage Notes (Signed)
Pt arrived [facility] from home with c/o intermittent SOB, cough x 2 weeks. Pt has a productive cough with brown sputum. Per ems pt has rhonchi in LLL and was wheezing through out. Pt given 125mg  solumedrol, 10 albuterol and 1mg  atrovent wen route with EMS. Dialysis pt MWF last went Wednesday because on Friday he was "itching" pt seen previously for itching.  Vitals with EMS 167/106 P100 cbg 133 99% during neb treatment. 18g IV in the [facility]

## 2018-10-03 NOTE — ED Notes (Signed)
Patient transported to X-ray 

## 2018-10-03 NOTE — Discharge Instructions (Signed)
You must go to dialysis on Monday, even if you are itching.  If your shortness of breath worsens, return to the ER.

## 2018-11-15 ENCOUNTER — Emergency Department: Payer: Medicare Other

## 2018-11-15 ENCOUNTER — Other Ambulatory Visit: Payer: Self-pay

## 2018-11-15 ENCOUNTER — Emergency Department
Admission: EM | Admit: 2018-11-15 | Discharge: 2018-11-15 | Disposition: A | Discharge status: Home / Self Care | Payer: Medicare Other | Attending: Internal Medicine | Admitting: Internal Medicine

## 2018-11-15 DIAGNOSIS — N185 Chronic kidney disease, stage 5: Secondary | ICD-10-CM | POA: Insufficient documentation

## 2018-11-15 DIAGNOSIS — D649 Anemia, unspecified: Secondary | ICD-10-CM | POA: Diagnosis not present

## 2018-11-15 DIAGNOSIS — R042 Hemoptysis: Secondary | ICD-10-CM | POA: Insufficient documentation

## 2018-11-15 DIAGNOSIS — I120 Hypertensive chronic kidney disease with stage 5 chronic kidney disease or end stage renal disease: Secondary | ICD-10-CM | POA: Diagnosis not present

## 2018-11-15 DIAGNOSIS — F17210 Nicotine dependence, cigarettes, uncomplicated: Secondary | ICD-10-CM | POA: Insufficient documentation

## 2018-11-15 DIAGNOSIS — Z992 Dependence on renal dialysis: Secondary | ICD-10-CM | POA: Insufficient documentation

## 2018-11-15 DIAGNOSIS — Z79899 Other long term (current) drug therapy: Secondary | ICD-10-CM | POA: Insufficient documentation

## 2018-11-15 DIAGNOSIS — R0602 Shortness of breath: Secondary | ICD-10-CM | POA: Diagnosis present

## 2018-11-15 LAB — CBC
HCT: 24.6 % — ABNORMAL LOW (ref 39.0–52.0)
Hemoglobin: 7.9 g/dL — ABNORMAL LOW (ref 13.0–17.0)
MCH: 35.7 pg — ABNORMAL HIGH (ref 26.0–34.0)
MCHC: 32.1 g/dL (ref 30.0–36.0)
MCV: 111.3 fL — ABNORMAL HIGH (ref 80.0–100.0)
Platelets: 162 10*3/uL (ref 150–400)
RBC: 2.21 MIL/uL — ABNORMAL LOW (ref 4.22–5.81)
RDW: 15.3 % (ref 11.5–15.5)
WBC: 8.2 10*3/uL (ref 4.0–10.5)
nRBC: 0.2 % (ref 0.0–0.2)

## 2018-11-15 LAB — COMPREHENSIVE METABOLIC PANEL
ALT: 16 U/L (ref 0–44)
AST: 21 U/L (ref 15–41)
Albumin: 3.7 g/dL (ref 3.5–5.0)
Alkaline Phosphatase: 45 U/L (ref 38–126)
Anion gap: 17 — ABNORMAL HIGH (ref 5–15)
BUN: 78 mg/dL — ABNORMAL HIGH (ref 6–20)
CO2: 25 mmol/L (ref 22–32)
Calcium: 10.8 mg/dL — ABNORMAL HIGH (ref 8.9–10.3)
Chloride: 97 mmol/L — ABNORMAL LOW (ref 98–111)
Creatinine, Ser: 14.35 mg/dL — ABNORMAL HIGH (ref 0.61–1.24)
GFR calc Af Amer: 4 mL/min — ABNORMAL LOW (ref >60)
GFR calc non Af Amer: 3 mL/min — ABNORMAL LOW (ref >60)
Glucose, Bld: 127 mg/dL — ABNORMAL HIGH (ref 70–99)
Potassium: 5 mmol/L (ref 3.5–5.1)
Sodium: 139 mmol/L (ref 135–145)
Total Bilirubin: 1.2 mg/dL (ref 0.3–1.2)
Total Protein: 7 g/dL (ref 6.5–8.1)

## 2018-11-15 LAB — TYPE AND SCREEN
ABO/RH(D): O NEG
Antibody Screen: NEGATIVE

## 2018-11-15 IMAGING — CT CT ANGIO CHEST
2 of 6 series · 18 of 46 positions shown · IV contrast (APPLIED)
Comparison: Chest x-ray [DATE]

CLINICAL DATA: Chronic kidney disease. Alcohol abuse. Hypertension.
Current shortness of breath with bloody sputum 4 days.

EXAM:
CT ANGIOGRAPHY CHEST WITH CONTRAST
TECHNIQUE: Multidetector CT imaging of the chest was performed using the
standard protocol during bolus administration of intravenous
contrast. Multiplanar CT image reconstructions and MIPs were
obtained to evaluate the vascular anatomy.
CONTRAST:  75mL OMNIPAQUE IOHEXOL 350 MG/ML SOLN

[Series 5: thins · axial · 0.65mm/px · z∈[-309,-54]mm · 15 of 281 slices shown]
[im 13/281  lung]
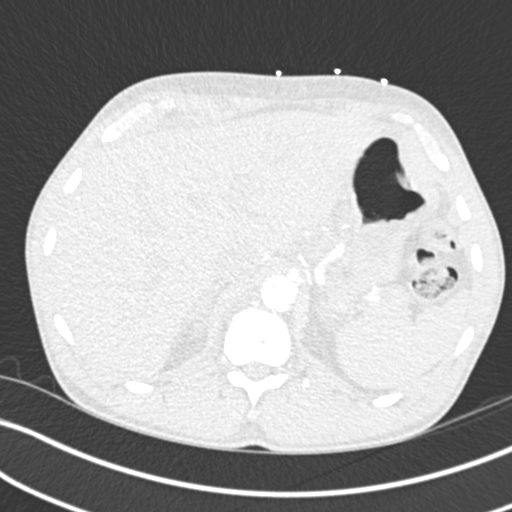
[im 37/281  soft-tissue]
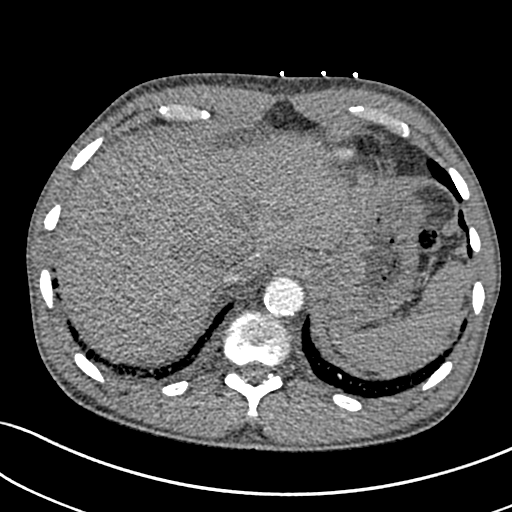
[im 49/281  lung]
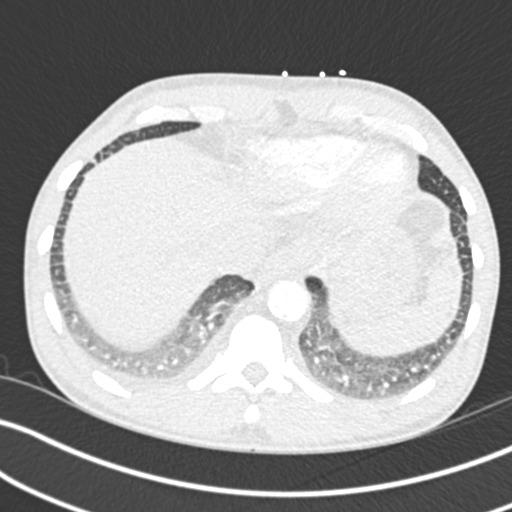
[im 74/281  soft-tissue]
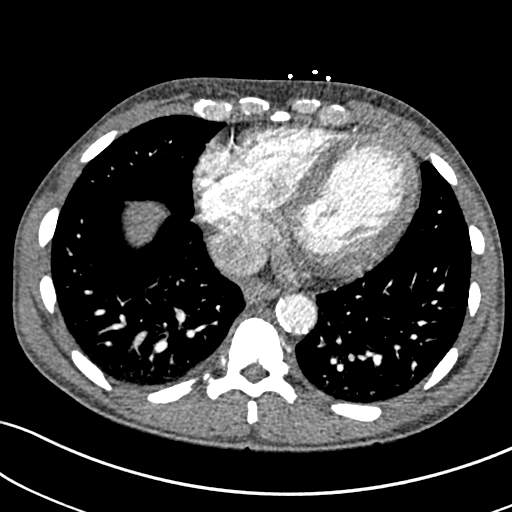
[im 86/281  lung]
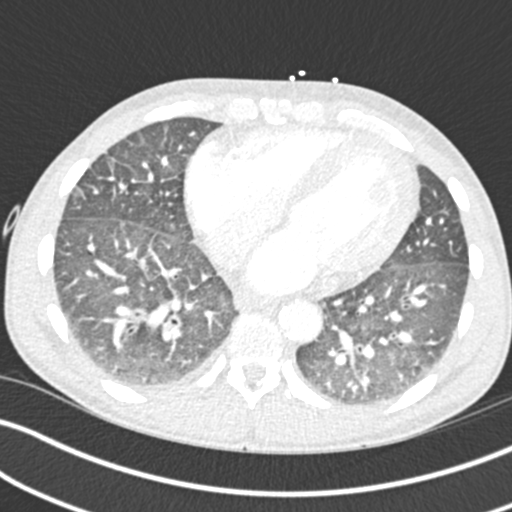
[im 110/281  soft-tissue]
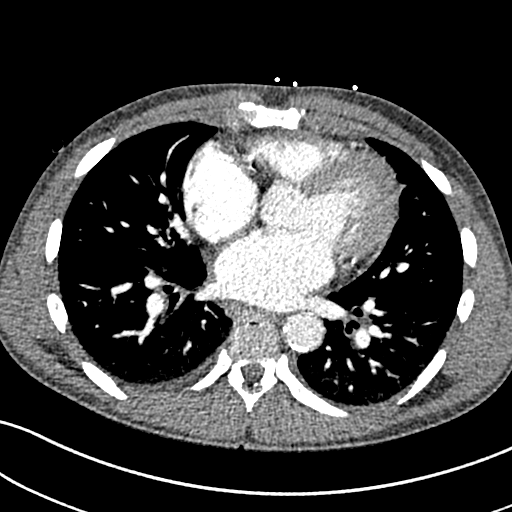
[im 122/281  lung]
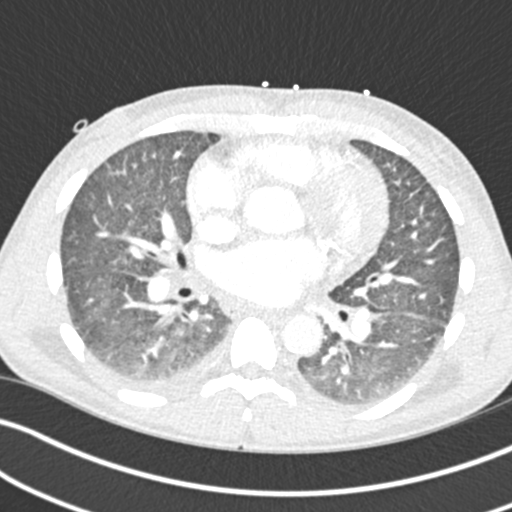
[im 147/281  soft-tissue]
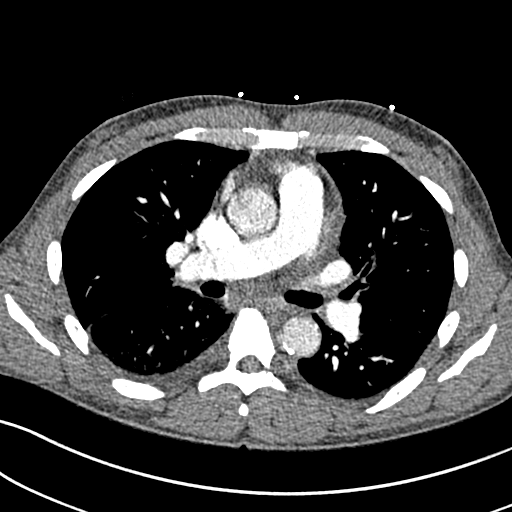
[im 159/281  lung]
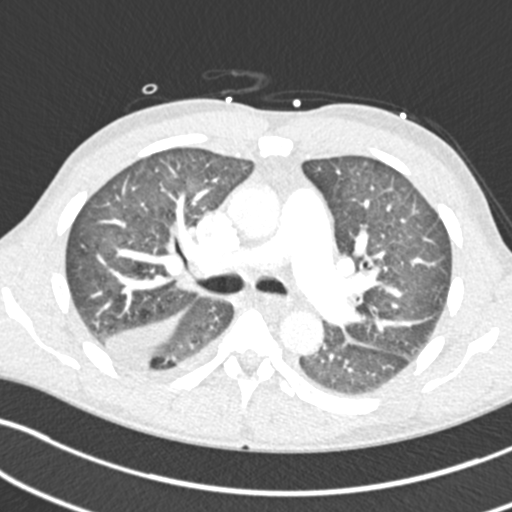
[im 171/281  soft-tissue]
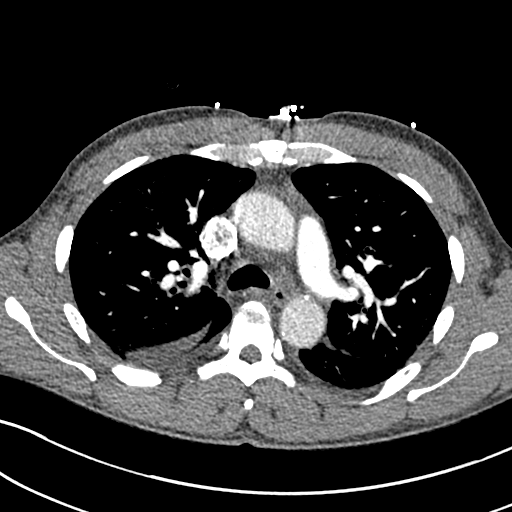
[im 195/281  lung]
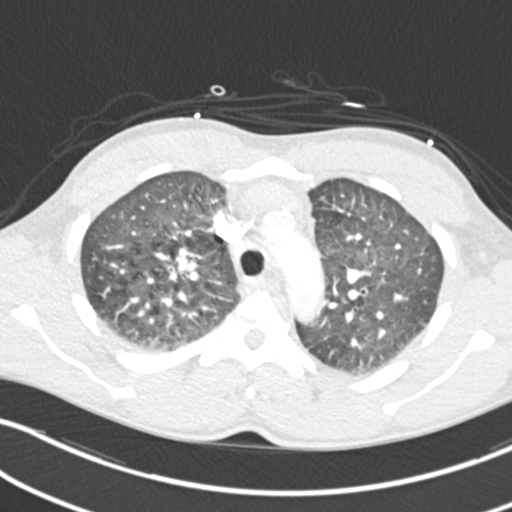
[im 207/281  soft-tissue]
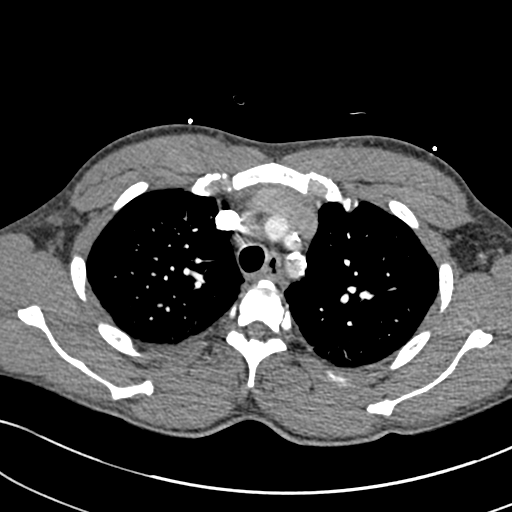
[im 232/281  lung]
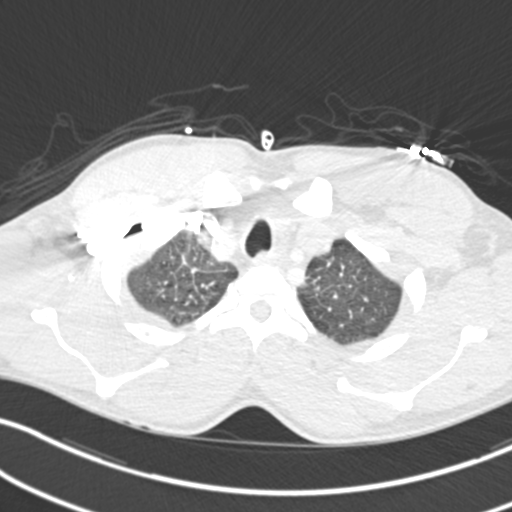
[im 244/281  soft-tissue]
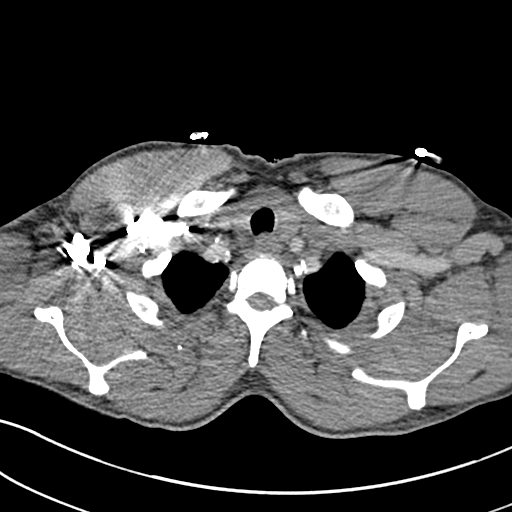
[im 268/281  lung]
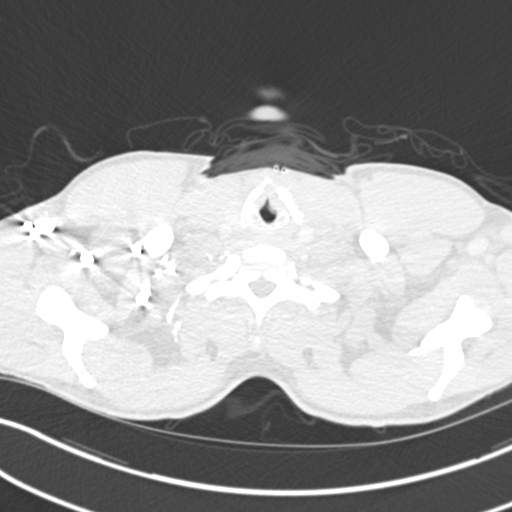

[Series 7: coronal mpr · coronal · 0.58mm/px · 3 of 79 slices shown]
[im 20/79  soft-tissue]
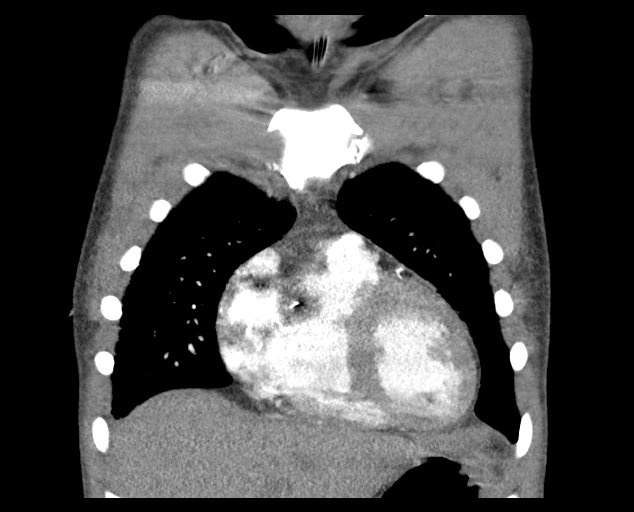
[im 40/79  soft-tissue]
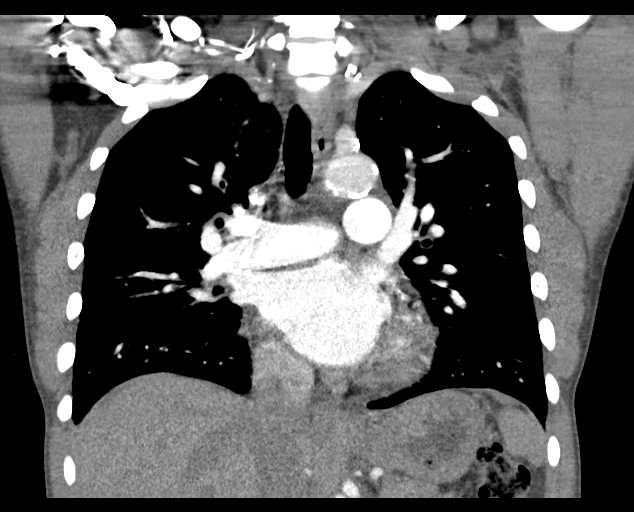
[im 59/79  soft-tissue]
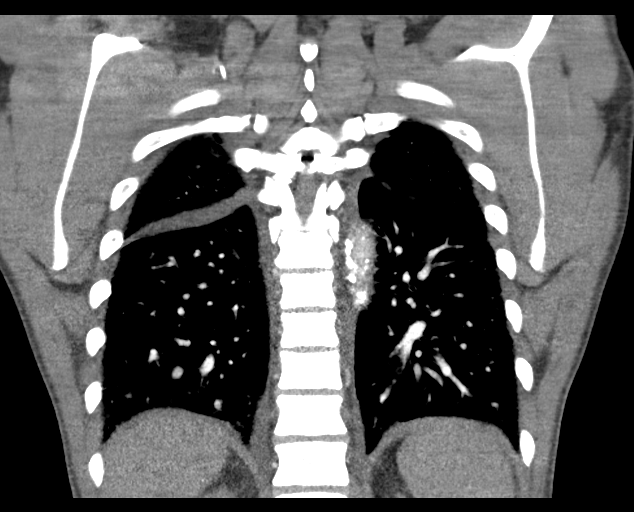

[18 of 46 positions shown; findings below may reference images not displayed]

FINDINGS: Cardiovascular: Mild cardiomegaly. Calcified plaque over the left
main and 3 vessel coronary arteries. Mild calcified plaque over the
thoracic aorta. No evidence of pulmonary emboli.

Mediastinum/Nodes: No mediastinal or hilar adenopathy. Remaining
mediastinal structures are normal.

Lungs/Pleura: Lungs are adequately inflated and demonstrate mild
bilateral hazy central perihilar increased attenuation of bibasilar
basilar increased attenuation suggesting interstitial edema. Minimal
fluid along the superior aspect of the right major fissure. Airways
are normal.

Upper Abdomen: No acute findings. Calcified plaque over the
abdominal aorta.

Musculoskeletal: Mild degenerative change of the spine.

Review of the MIP images confirms the above findings.
IMPRESSION: Subtle hazy bilateral perihilar and bibasilar attenuation likely
interstitial edema. Mild cardiomegaly. Tiny amount right pleural
fluid.

No evidence of pulmonary emboli.

Aortic Atherosclerosis ([DJ]-[DJ]). Atherosclerotic coronary
artery disease.

## 2018-11-15 MED ORDER — IOHEXOL 350 MG/ML SOLN
75.0000 mL | Freq: Once | INTRAVENOUS | Status: AC | PRN
  Administered 2018-11-15: 75 mL via INTRAVENOUS

## 2018-11-15 NOTE — ED Triage Notes (Signed)
Pt c/o SOB with bloody sputum for the past 4 days, states it was a lot of blood last night. Pt is in NAD at present. Respirations WNL. Skin is warm and dry.

## 2018-11-15 NOTE — ED Provider Notes (Signed)
[facility] Emergency Department Provider Note       Time seen: ----------------------------------------- 2:33 PM on 11/15/2018 -----------------------------------------   I have reviewed the triage vital signs and the nursing notes.  HISTORY   Chief Complaint Hemoptysis    HPI Jordan [name] is a 53 y.o. male with a history of anemia, chronic kidney disease, on dialysis Monday, Wednesday and Friday, alcohol abuse, hypertension who presents to the ED for shortness of breath with bloody sputum for the past 4 days.  Patient states that happens more at night, particular when he lays down.  He denies fevers, chills or other complaints.  Past Medical History:  Diagnosis Date  . Anemia   . CKD (chronic kidney disease)    Stage 5  Dialysis - M/W/F  . ETOH abuse   . Hypertension     Patient Active Problem List   Diagnosis Date Noted  . Pulmonary edema 07/18/2018  . Anemia in ESRD (end-stage renal disease) ([place]) 05/17/2018  . Hepatitis B 03/19/2018  . Encephalopathy 03/19/2018  . 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 History of macrocytic anemia 03/19/2018  . History of iron deficiency anemia 03/19/2018  . ESRD on dialysis ([place]) 05/26/2017  . Essential hypertension 05/26/2017  . Gastritis with hemorrhage 05/26/2017  . Tobacco abuse 08/04/2016  . ETOH abuse 08/04/2016  . Uncontrolled hypertension 08/04/2016  . Acute renal failure (ARF) ([place]) 08/04/2016  . Symptomatic anemia 08/04/2016    Past Surgical History:  Procedure Laterality Date  . BASCILIC VEIN TRANSPOSITION Left 08/07/2016   Procedure: LEFT BASILIC VEIN TRANSPOSITION;  Surgeon: Angelia Mould, MD;  Location: [place];  Service: Vascular;  Laterality: Left;  . COLONOSCOPY N/A 08/12/2016   Procedure: COLONOSCOPY;  Surgeon: Otis Brace, MD;  Location: [place];  Service: Gastroenterology;  Laterality: N/A;  . ESOPHAGOGASTRODUODENOSCOPY N/A 08/12/2016   Procedure:  ESOPHAGOGASTRODUODENOSCOPY (EGD);  Surgeon: Otis Brace, MD;  Location: [place];  Service: Gastroenterology;  Laterality: N/A;  . INSERTION OF DIALYSIS CATHETER N/A 08/07/2016   Procedure: INSERTION OF TUNNELED DIALYSIS CATHETER;  Surgeon: Angelia Mould, MD;  Location: [place];  Service: Vascular;  Laterality: N/A;  . LIGATION OF ARTERIOVENOUS  FISTULA Left 09/12/2016   Procedure: BANDING OF LEFT  ARTERIOVENOUS  FISTULA;  Surgeon: Angelia Mould, MD;  Location: [place];  Service: Vascular;  Laterality: Left;  . NO PAST SURGERIES      Allergies Patient has no known allergies.  Social History Social History   Tobacco Use  . Smoking status: Current Every Day Smoker    Packs/day: 0.05    Years: 15.00    Pack years: 0.75    Types: Cigarettes  . Smokeless tobacco: Never Used  . Tobacco comment: down to 1 a day  Substance Use Topics  . Alcohol use: Yes    Alcohol/week: 21.0 standard drinks    Types: 21 Cans of beer per week    Comment: none since 08/04/16  . Drug use: No   Review of Systems Constitutional: Negative for fever. Cardiovascular: Negative for chest pain. Respiratory: Negative for shortness of breath.  Positive for cough and hemoptysis Gastrointestinal: Negative for abdominal pain, vomiting and diarrhea. Musculoskeletal: Negative for back pain. Skin: Negative for rash. Neurological: Negative for headaches, focal weakness or numbness.  All systems negative/normal/unremarkable except as stated in the HPI  ____________________________________________   PHYSICAL EXAM:  VITAL SIGNS: ED Triage Vitals  Enc Vitals Group     BP 11/15/18 1227 Marland Kitchen)  178/100     Pulse Rate 11/15/18 1227 (!) 102     Resp 11/15/18 1227 17     Temp 11/15/18 1227 98 F (36.7 C)     Temp Source 11/15/18 1227 Oral     SpO2 11/15/18 1227 99 %     Weight 11/15/18 1229 154 lb (69.9 kg)     Height 11/15/18 1229 5\' 4"  (1.626 m)     Head Circumference --      Peak Flow --       Pain Score 11/15/18 1229 0     Pain Loc --      Pain Edu? --      Excl. in [place]? --     Constitutional: Alert and oriented.  No acute distress Eyes: Conjunctivae are normal. Normal extraocular movements. Cardiovascular: Normal rate, regular rhythm. No murmurs, rubs, or gallops. Respiratory: Normal respiratory effort without tachypnea nor retractions. Breath sounds are clear and equal bilaterally. Gastrointestinal: Soft and nontender. Normal bowel sounds Musculoskeletal: Nontender with normal range of motion in extremities. No lower extremity tenderness nor edema. Neurologic:  Normal speech and language. No gross focal neurologic deficits are appreciated.  Skin:  Skin is warm, dry and intact. No rash noted. Psychiatric: Mood and affect are normal. Speech and behavior are normal.  ____________________________________________  ED COURSE:  As part of my medical decision making, I reviewed the following data within the [facility] History obtained from family if available, nursing notes, old chart and ekg, as well as notes from prior ED visits. Patient presented for hemoptysis, we will assess with labs and imaging as indicated at this time.   Procedures ____________________________________________   LABS (pertinent positives/negatives)  Labs Reviewed  COMPREHENSIVE METABOLIC PANEL - Abnormal; Notable for the following components:      Result Value   Chloride 97 (*)    Glucose, Bld 127 (*)    BUN 78 (*)    Creatinine, Ser 14.35 (*)    Calcium 10.8 (*)    GFR calc non Af Amer 3 (*)    GFR calc Af Amer 4 (*)    Anion gap 17 (*)    All other components within normal limits  CBC - Abnormal; Notable for the following components:   RBC 2.21 (*)    Hemoglobin 7.9 (*)    HCT 24.6 (*)    MCV 111.3 (*)    MCH 35.7 (*)    All other components within normal limits  TYPE AND SCREEN    RADIOLOGY  CTA chest Is still pending ____________________________________________    DIFFERENTIAL DIAGNOSIS   Bronchitis, bronchospasm, pneumonia, PE, influenza  FINAL ASSESSMENT AND PLAN  Hemoptysis   Plan: The patient had presented for hemoptysis. Patient's labs are at his baseline. Patient's imaging still pending at this time   Laurence Aly, MD    Note: This note was generated in part or whole with voice recognition software. Voice recognition is usually quite accurate but there are transcription errors that can and very often do occur. I apologize for any typographical errors that were not detected and corrected.     Earleen Newport, MD 11/15/18 1515

## 2018-11-15 NOTE — ED Provider Notes (Signed)
CT Angio back and negative for PE. Does show some slight edema. Patient is scheduled for dialysis tomorrow at 11 am. Patient states he does have oxygen at home. Comfortable with plan to follow up tomorrow.    Nance Pear, MD 11/15/18 [phone]

## 2018-11-15 NOTE — Discharge Instructions (Signed)
Please seek medical attention for any high fevers, chest pain, shortness of breath, change in behavior, persistent vomiting, bloody stool or any other new or concerning symptoms.  

## 2018-11-15 NOTE — ED Notes (Signed)
Patient transported to CT 

## 2018-11-15 NOTE — ED Notes (Signed)
Patient requested something to eat. It was given the OK from Dr. Jimmye Norman .

## 2018-11-30 ENCOUNTER — Other Ambulatory Visit: Payer: Self-pay

## 2018-11-30 ENCOUNTER — Emergency Department: Payer: Medicare Other

## 2018-11-30 ENCOUNTER — Inpatient Hospital Stay
Admission: EM | Admit: 2018-11-30 | Discharge: 2018-12-03 | DRG: 915 | Disposition: A | Discharge status: Home / Self Care | Payer: Medicare Other | Attending: Internal Medicine | Admitting: Internal Medicine

## 2018-11-30 DIAGNOSIS — N2581 Secondary hyperparathyroidism of renal origin: Secondary | ICD-10-CM | POA: Diagnosis present

## 2018-11-30 DIAGNOSIS — E875 Hyperkalemia: Secondary | ICD-10-CM | POA: Diagnosis present

## 2018-11-30 DIAGNOSIS — S7011XA Contusion of right thigh, initial encounter: Secondary | ICD-10-CM

## 2018-11-30 DIAGNOSIS — Z841 Family history of disorders of kidney and ureter: Secondary | ICD-10-CM | POA: Diagnosis not present

## 2018-11-30 DIAGNOSIS — N19 Unspecified kidney failure: Secondary | ICD-10-CM | POA: Diagnosis present

## 2018-11-30 DIAGNOSIS — T782XXA Anaphylactic shock, unspecified, initial encounter: Secondary | ICD-10-CM

## 2018-11-30 DIAGNOSIS — Z833 Family history of diabetes mellitus: Secondary | ICD-10-CM

## 2018-11-30 DIAGNOSIS — Z7952 Long term (current) use of systemic steroids: Secondary | ICD-10-CM

## 2018-11-30 DIAGNOSIS — T148XXA Other injury of unspecified body region, initial encounter: Secondary | ICD-10-CM

## 2018-11-30 DIAGNOSIS — Z992 Dependence on renal dialysis: Secondary | ICD-10-CM

## 2018-11-30 DIAGNOSIS — N186 End stage renal disease: Secondary | ICD-10-CM

## 2018-11-30 DIAGNOSIS — D631 Anemia in chronic kidney disease: Secondary | ICD-10-CM | POA: Diagnosis present

## 2018-11-30 DIAGNOSIS — I120 Hypertensive chronic kidney disease with stage 5 chronic kidney disease or end stage renal disease: Secondary | ICD-10-CM | POA: Diagnosis present

## 2018-11-30 DIAGNOSIS — Y848 Other medical procedures as the cause of abnormal reaction of the patient, or of later complication, without mention of misadventure at the time of the procedure: Secondary | ICD-10-CM | POA: Diagnosis present

## 2018-11-30 DIAGNOSIS — F17210 Nicotine dependence, cigarettes, uncomplicated: Secondary | ICD-10-CM | POA: Diagnosis present

## 2018-11-30 DIAGNOSIS — T7840XA Allergy, unspecified, initial encounter: Principal | ICD-10-CM | POA: Diagnosis present

## 2018-11-30 DIAGNOSIS — L7632 Postprocedural hematoma of skin and subcutaneous tissue following other procedure: Secondary | ICD-10-CM | POA: Diagnosis present

## 2018-11-30 DIAGNOSIS — Z79899 Other long term (current) drug therapy: Secondary | ICD-10-CM | POA: Diagnosis not present

## 2018-11-30 LAB — CBC WITH DIFFERENTIAL/PLATELET
Abs Immature Granulocytes: 0.03 10*3/uL (ref 0.00–0.07)
Basophils Absolute: 0 10*3/uL (ref 0.0–0.1)
Basophils Relative: 0 %
Eosinophils Absolute: 0.5 10*3/uL (ref 0.0–0.5)
Eosinophils Relative: 5 %
HCT: 22.1 % — ABNORMAL LOW (ref 39.0–52.0)
Hemoglobin: 7 g/dL — ABNORMAL LOW (ref 13.0–17.0)
Immature Granulocytes: 0 %
Lymphocytes Relative: 7 %
Lymphs Abs: 0.6 10*3/uL — ABNORMAL LOW (ref 0.7–4.0)
MCH: 35.5 pg — ABNORMAL HIGH (ref 26.0–34.0)
MCHC: 31.7 g/dL (ref 30.0–36.0)
MCV: 112.2 fL — ABNORMAL HIGH (ref 80.0–100.0)
Monocytes Absolute: 0.4 10*3/uL (ref 0.1–1.0)
Monocytes Relative: 5 %
Neutro Abs: 7.1 10*3/uL (ref 1.7–7.7)
Neutrophils Relative %: 83 %
Platelets: 150 10*3/uL (ref 150–400)
RBC: 1.97 MIL/uL — ABNORMAL LOW (ref 4.22–5.81)
RDW: 14.7 % (ref 11.5–15.5)
Smear Review: NORMAL
WBC: 8.7 10*3/uL (ref 4.0–10.5)
nRBC: 0 % (ref 0.0–0.2)

## 2018-11-30 LAB — COMPREHENSIVE METABOLIC PANEL
ALT: 22 U/L (ref 0–44)
AST: 37 U/L (ref 15–41)
Albumin: 3.3 g/dL — ABNORMAL LOW (ref 3.5–5.0)
Alkaline Phosphatase: 38 U/L (ref 38–126)
Anion gap: 18 — ABNORMAL HIGH (ref 5–15)
BUN: 74 mg/dL — ABNORMAL HIGH (ref 6–20)
CO2: 22 mmol/L (ref 22–32)
Calcium: 9.7 mg/dL (ref 8.9–10.3)
Chloride: 99 mmol/L (ref 98–111)
Creatinine, Ser: 13.63 mg/dL — ABNORMAL HIGH (ref 0.61–1.24)
GFR calc Af Amer: 4 mL/min — ABNORMAL LOW (ref >60)
GFR calc non Af Amer: 4 mL/min — ABNORMAL LOW (ref >60)
Glucose, Bld: 100 mg/dL — ABNORMAL HIGH (ref 70–99)
Potassium: 5.4 mmol/L — ABNORMAL HIGH (ref 3.5–5.1)
Sodium: 139 mmol/L (ref 135–145)
Total Bilirubin: 1.3 mg/dL — ABNORMAL HIGH (ref 0.3–1.2)
Total Protein: 6.7 g/dL (ref 6.5–8.1)

## 2018-11-30 IMAGING — DX PORTABLE CHEST - 1 VIEW
1 series · 1 of 1 positions shown · non-contrast
Comparison: [DATE]

CLINICAL DATA: Shortness of breath

EXAM:
PORTABLE CHEST 1 VIEW

[chest ap]
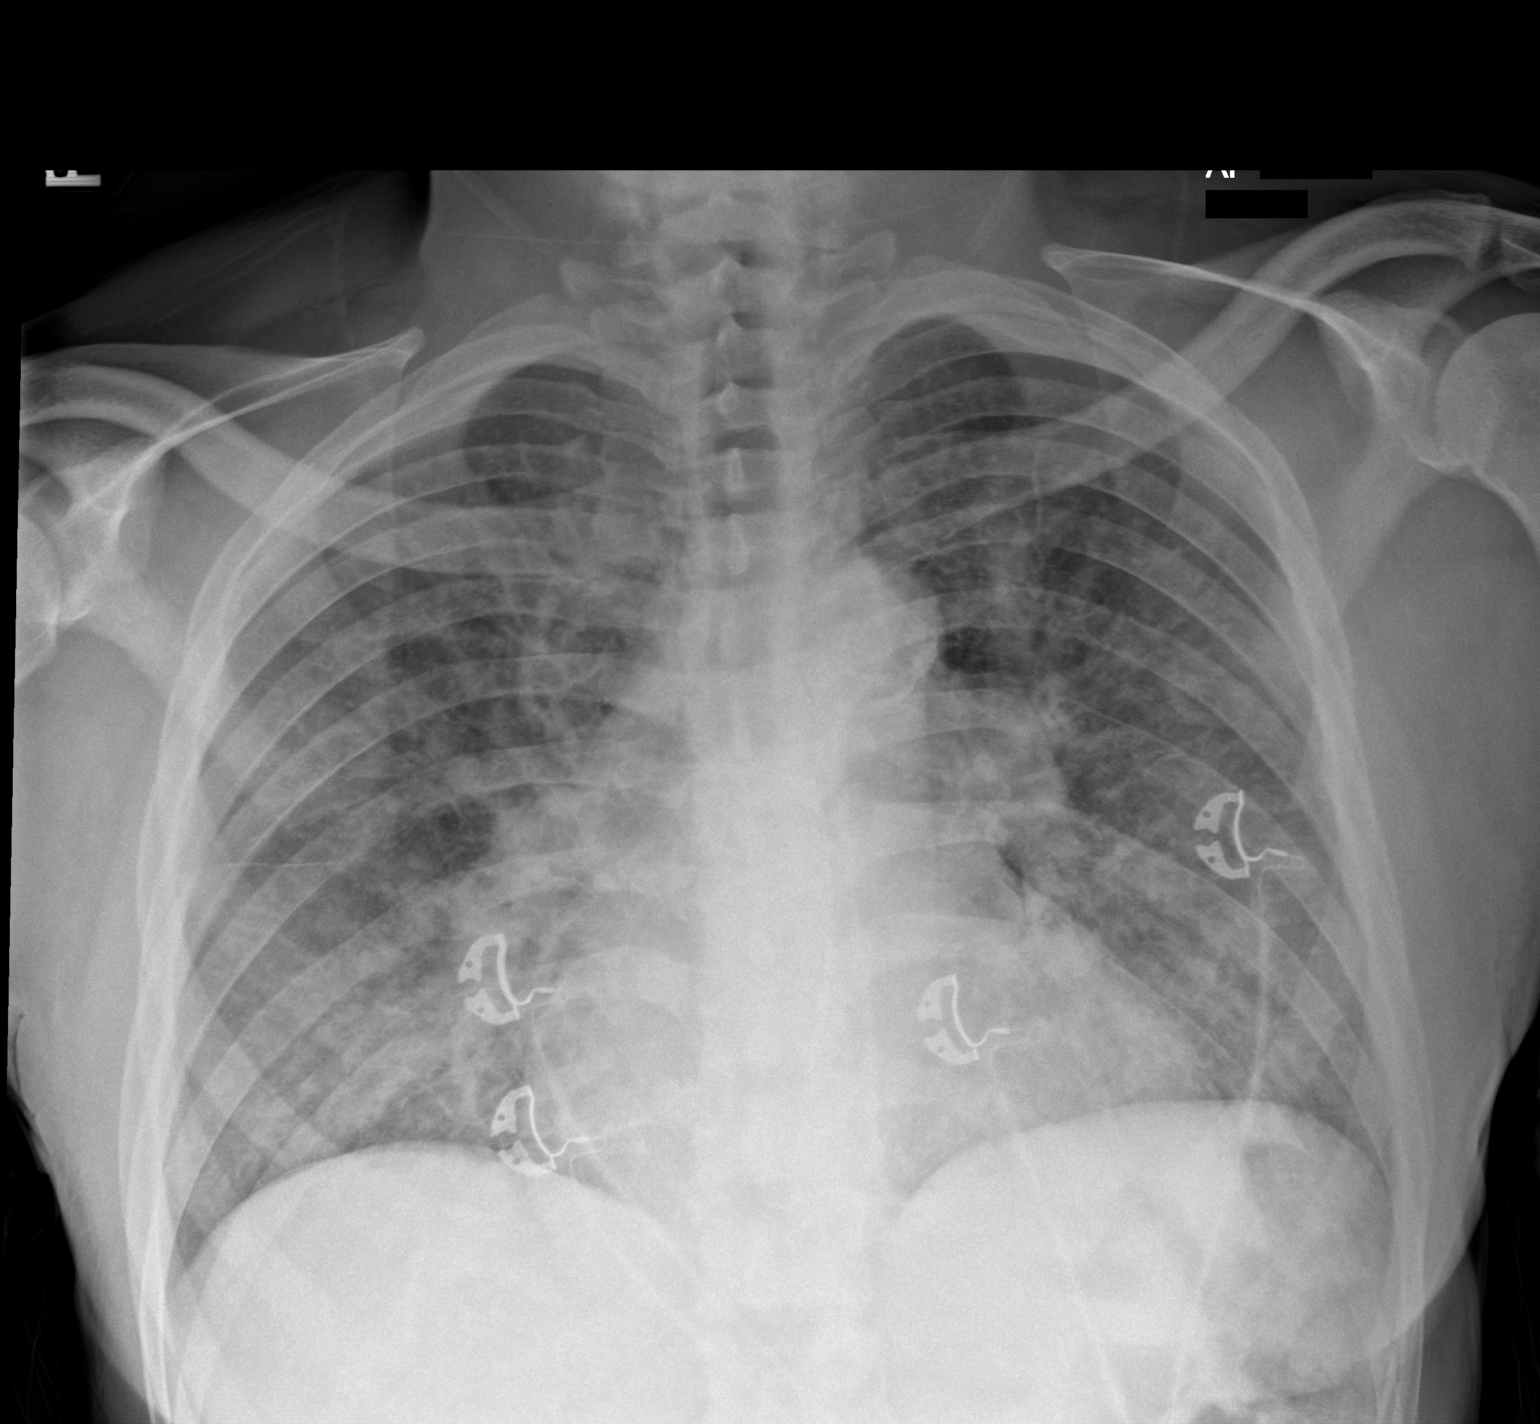

[1 of 1 positions shown; findings below may reference images not displayed]

FINDINGS: Cardiac shadow is prominent. Aortic calcifications are again seen.
Diffuse vascular congestion is noted without interstitial edema
likely related to volume overload. This has increased in the
interval from the prior exam. No bony abnormality is seen.
IMPRESSION: Increasing vascular congestion when compared with the prior exam.

## 2018-11-30 MED ORDER — METHYLPREDNISOLONE SODIUM SUCC 125 MG IJ SOLR
125.0000 mg | Freq: Once | INTRAMUSCULAR | Status: AC
  Administered 2018-11-30: 125 mg via INTRAVENOUS

## 2018-11-30 MED ORDER — METHYLPREDNISOLONE SODIUM SUCC 125 MG IJ SOLR
60.0000 mg | Freq: Two times a day (BID) | INTRAMUSCULAR | Status: DC
  Administered 2018-11-30 – 2018-12-01 (×2): 60 mg via INTRAVENOUS
  Filled 2018-11-30 (×2): qty 2

## 2018-11-30 MED ORDER — DIPHENHYDRAMINE HCL 25 MG PO CAPS: 50.0000 mg | ORAL_CAPSULE | Freq: Once | ORAL | Status: DC

## 2018-11-30 MED ORDER — FOLIC ACID 1 MG PO TABS
1.0000 mg | ORAL_TABLET | Freq: Every day | ORAL | Status: DC
  Administered 2018-11-30: 1 mg via ORAL
  Filled 2018-11-30: qty 1

## 2018-11-30 MED ORDER — EPOETIN ALFA 4000 UNIT/ML IJ SOLN
4000.0000 [IU] | Freq: Once | INTRAMUSCULAR | Status: AC
  Administered 2018-11-30: 4000 [IU] via INTRAVENOUS
  Filled 2018-11-30: qty 1

## 2018-11-30 MED ORDER — AMLODIPINE BESYLATE 10 MG PO TABS
10.0000 mg | ORAL_TABLET | Freq: Every day | ORAL | Status: DC
  Administered 2018-12-03: 10 mg via ORAL
  Filled 2018-11-30 (×3): qty 1

## 2018-11-30 MED ORDER — VITAMIN B-1 100 MG PO TABS
100.0000 mg | ORAL_TABLET | Freq: Every day | ORAL | Status: DC
  Administered 2018-11-30: 100 mg via ORAL
  Filled 2018-11-30: qty 1

## 2018-11-30 MED ORDER — SODIUM CHLORIDE 0.9 % IV SOLN: 100.0000 mL | INTRAVENOUS | Status: DC | PRN

## 2018-11-30 MED ORDER — DIPHENHYDRAMINE HCL 50 MG/ML IJ SOLN
INTRAMUSCULAR | Status: AC
  Filled 2018-11-30: qty 1

## 2018-11-30 MED ORDER — DOXYCYCLINE HYCLATE 100 MG PO TABS
100.0000 mg | ORAL_TABLET | Freq: Two times a day (BID) | ORAL | Status: DC
  Administered 2018-11-30 – 2018-12-03 (×5): 100 mg via ORAL
  Filled 2018-11-30 (×5): qty 1

## 2018-11-30 MED ORDER — ALBUTEROL SULFATE HFA 108 (90 BASE) MCG/ACT IN AERS: 2.0000 | INHALATION_SPRAY | RESPIRATORY_TRACT | Status: DC | PRN

## 2018-11-30 MED ORDER — DIPHENHYDRAMINE HCL 50 MG/ML IJ SOLN
25.0000 mg | Freq: Four times a day (QID) | INTRAMUSCULAR | Status: DC | PRN
  Administered 2018-11-30 – 2018-12-03 (×6): 25 mg via INTRAVENOUS
  Filled 2018-11-30 (×4): qty 1

## 2018-11-30 MED ORDER — FAMOTIDINE IN NACL 20-0.9 MG/50ML-% IV SOLN
20.0000 mg | Freq: Two times a day (BID) | INTRAVENOUS | Status: DC
  Administered 2018-11-30 – 2018-12-03 (×5): 20 mg via INTRAVENOUS
  Filled 2018-11-30 (×5): qty 50

## 2018-11-30 MED ORDER — SEVELAMER CARBONATE 800 MG PO TABS
1600.0000 mg | ORAL_TABLET | Freq: Three times a day (TID) | ORAL | Status: DC
  Administered 2018-11-30 – 2018-12-03 (×7): 1600 mg via ORAL
  Filled 2018-11-30 (×7): qty 2

## 2018-11-30 MED ORDER — DIPHENHYDRAMINE HCL 50 MG/ML IJ SOLN
50.0000 mg | Freq: Once | INTRAMUSCULAR | Status: AC
  Administered 2018-11-30: 50 mg via INTRAVENOUS

## 2018-11-30 MED ORDER — HEPARIN SODIUM (PORCINE) 1000 UNIT/ML DIALYSIS
1000.0000 [IU] | INTRAMUSCULAR | Status: DC | PRN
  Filled 2018-11-30: qty 1

## 2018-11-30 MED ORDER — EPINEPHRINE 0.3 MG/0.3ML IJ SOAJ
0.3000 mg | Freq: Once | INTRAMUSCULAR | Status: AC
  Administered 2018-11-30: 0.3 mg via INTRAMUSCULAR

## 2018-11-30 MED ORDER — HEPARIN SODIUM (PORCINE) 1000 UNIT/ML DIALYSIS
20.0000 [IU]/kg | INTRAMUSCULAR | Status: DC | PRN
  Filled 2018-11-30: qty 2

## 2018-11-30 MED ORDER — CHLORHEXIDINE GLUCONATE CLOTH 2 % EX PADS
6.0000 | MEDICATED_PAD | Freq: Every day | CUTANEOUS | Status: DC
  Filled 2018-11-30: qty 6

## 2018-11-30 MED ORDER — LIDOCAINE-PRILOCAINE 2.5-2.5 % EX CREA
1.0000 "application " | TOPICAL_CREAM | CUTANEOUS | Status: DC | PRN
  Filled 2018-11-30: qty 5

## 2018-11-30 MED ORDER — DARBEPOETIN ALFA 60 MCG/0.3ML IJ SOSY: 60.0000 ug | PREFILLED_SYRINGE | INTRAMUSCULAR | Status: DC

## 2018-11-30 MED ORDER — NICOTINE 14 MG/24HR TD PT24
14.0000 mg | MEDICATED_PATCH | Freq: Every day | TRANSDERMAL | Status: DC
  Administered 2018-11-30 – 2018-12-03 (×4): 14 mg via TRANSDERMAL
  Filled 2018-11-30 (×4): qty 1

## 2018-11-30 MED ORDER — LORAZEPAM 2 MG/ML IJ SOLN
1.0000 mg | Freq: Once | INTRAMUSCULAR | Status: AC
  Administered 2018-11-30: 1 mg via INTRAVENOUS
  Filled 2018-11-30: qty 1

## 2018-11-30 MED ORDER — LIDOCAINE HCL (PF) 1 % IJ SOLN
5.0000 mL | INTRAMUSCULAR | Status: DC | PRN
  Filled 2018-11-30: qty 5

## 2018-11-30 MED ORDER — HYDROXYZINE HCL 25 MG PO TABS
50.0000 mg | ORAL_TABLET | Freq: Three times a day (TID) | ORAL | Status: DC | PRN
  Administered 2018-12-02: 50 mg via ORAL
  Filled 2018-11-30: qty 2

## 2018-11-30 MED ORDER — PENTAFLUOROPROP-TETRAFLUOROETH EX AERO
1.0000 "application " | INHALATION_SPRAY | CUTANEOUS | Status: DC | PRN
  Filled 2018-11-30: qty 30

## 2018-11-30 MED ORDER — ACETAMINOPHEN 325 MG PO TABS
650.0000 mg | ORAL_TABLET | Freq: Four times a day (QID) | ORAL | Status: DC | PRN
  Administered 2018-12-02: 650 mg via ORAL
  Filled 2018-11-30: qty 2

## 2018-11-30 MED ORDER — FAMOTIDINE IN NACL 20-0.9 MG/50ML-% IV SOLN
20.0000 mg | Freq: Once | INTRAVENOUS | Status: AC
  Administered 2018-11-30: 20 mg via INTRAVENOUS

## 2018-11-30 MED ORDER — ALBUTEROL SULFATE (2.5 MG/3ML) 0.083% IN NEBU: 2.5000 mg | INHALATION_SOLUTION | RESPIRATORY_TRACT | Status: DC | PRN

## 2018-11-30 MED ORDER — ALBUTEROL SULFATE (2.5 MG/3ML) 0.083% IN NEBU
5.0000 mg | INHALATION_SOLUTION | Freq: Once | RESPIRATORY_TRACT | Status: AC
  Administered 2018-11-30: 5 mg via RESPIRATORY_TRACT
  Filled 2018-11-30: qty 6

## 2018-11-30 MED ORDER — ADULT MULTIVITAMIN W/MINERALS CH
1.0000 | ORAL_TABLET | Freq: Every day | ORAL | Status: DC
  Administered 2018-11-30: 1 via ORAL
  Filled 2018-11-30: qty 1

## 2018-11-30 NOTE — ED Provider Notes (Signed)
Patient is likely uremic which is causing his severe itching.  He does have a right thigh hematoma from epi shot.  This will need to be observed.  We will place an ice pack on the hematoma.  I will discuss with nephrology for the possibility of dialyzing him in the hospital.   Earleen Newport, MD 11/30/18 [phone]

## 2018-11-30 NOTE — ED Notes (Signed)
Pt continues to be restless and complains of itching.  EDP notified; see new orders.

## 2018-11-30 NOTE — ED Notes (Signed)
Nephrology to bedside.  

## 2018-11-30 NOTE — ED Notes (Signed)
ED TO INPATIENT HANDOFF REPORT  ED Nurse Name and Phone #: Caryl Pina, RN [phone]  S Name/Age/Gender Jordan [name] 53 y.o. male Room/Bed: ED07A/ED07A  Code Status   Code Status: Full Code  Home/SNF/Other Home Patient oriented to: self, place, time and situation Is this baseline? Yes   Triage Complete: Triage complete  Chief Complaint SOB, Poss Allergic Reaction  Triage Note Pt [facility] from kidney center with c/o allergic reaction. EMS states pt was there to for dialysis when he started feeling SHOB and itching all over. EMS states that pt was having some expiratory wheezing so initiated a duoneb en route. EMS states that he is on a new medication but is unknown what it is. Pt has some bloody sputum as well as some hives on body. Pt had dialysis on Friday.    Allergies No Known Allergies  Level of Care/Admitting Diagnosis ED Disposition    ED Disposition Condition [facility] Area: [place] [[id number]]  Level of Care: Telemetry [5]  Diagnosis: Allergic reaction [428768]  Admitting Physician: Otila Back [place]  Attending Physician: Otila Back [[id number]]  Estimated length of stay: past midnight tomorrow  Certification:: I certify this patient will need inpatient services for at least 2 midnights  PT Class (Do Not Modify): Inpatient [101]  PT Acc Code (Do Not Modify): Private [1]       B Medical/Surgery History Past Medical History:  Diagnosis Date  . Anemia   . CKD (chronic kidney disease)    Stage 5  Dialysis - M/W/F  . ETOH abuse   . Hypertension    Past Surgical History:  Procedure Laterality Date  . BASCILIC VEIN TRANSPOSITION Left 08/07/2016   Procedure: LEFT BASILIC VEIN TRANSPOSITION;  Surgeon: Angelia Mould, MD;  Location: [place];  Service: Vascular;  Laterality: Left;  . COLONOSCOPY N/A 08/12/2016   Procedure: COLONOSCOPY;  Surgeon: Otis Brace, MD;  Location: [place];  Service:  Gastroenterology;  Laterality: N/A;  . ESOPHAGOGASTRODUODENOSCOPY N/A 08/12/2016   Procedure: ESOPHAGOGASTRODUODENOSCOPY (EGD);  Surgeon: Otis Brace, MD;  Location: [place];  Service: Gastroenterology;  Laterality: N/A;  . INSERTION OF DIALYSIS CATHETER N/A 08/07/2016   Procedure: INSERTION OF TUNNELED DIALYSIS CATHETER;  Surgeon: Angelia Mould, MD;  Location: [place];  Service: Vascular;  Laterality: N/A;  . LIGATION OF ARTERIOVENOUS  FISTULA Left 09/12/2016   Procedure: BANDING OF LEFT  ARTERIOVENOUS  FISTULA;  Surgeon: Angelia Mould, MD;  Location: [place];  Service: Vascular;  Laterality: Left;  . NO PAST SURGERIES       A IV Location/Drains/Wounds Patient Lines/Drains/Airways Status   Active Line/Drains/Airways    Name:   Placement date:   Placement time:   Site:   Days:   Peripheral IV 11/30/18 Right Antecubital   11/30/18    0645    Antecubital   less than 1   Fistula / Graft Left Upper arm Arteriovenous fistula   -    -    Upper arm      Hemodialysis Catheter Right Subclavian Double-lumen   08/07/16    1520    Subclavian   845   Incision (Closed) 08/07/16 Chest Right   08/07/16    1605     845   Incision (Closed) 08/07/16 Arm Left   08/07/16    1605     845   Incision (Closed) 09/12/16 Arm Left   09/12/16    1252  809          Intake/Output Last 24 hours  Intake/Output Summary (Last 24 hours) at 11/30/2018 1052 Last data filed at 11/30/2018 0715 Gross per 24 hour  Intake 50 ml  Output -  Net 50 ml    Labs/Imaging Results for orders placed or performed during the hospital encounter of 11/30/18 (from the past 48 hour(s))  Comprehensive metabolic panel     Status: Abnormal   Collection Time: 11/30/18  8:07 AM  Result Value Ref Range   Sodium 139 135 - 145 mmol/L   Potassium 5.4 (H) 3.5 - 5.1 mmol/L    Comment: HEMOLYSIS AT THIS LEVEL MAY AFFECT RESULT   Chloride 99 98 - 111 mmol/L   CO2 22 22 - 32 mmol/L   Glucose, Bld 100 (H) 70 - 99 mg/dL    BUN 74 (H) 6 - 20 mg/dL   Creatinine, Ser 13.63 (H) 0.61 - 1.24 mg/dL   Calcium 9.7 8.9 - 10.3 mg/dL   Total Protein 6.7 6.5 - 8.1 g/dL   Albumin 3.3 (L) 3.5 - 5.0 g/dL   AST 37 15 - 41 U/L    Comment: HEMOLYSIS AT THIS LEVEL MAY AFFECT RESULT   ALT 22 0 - 44 U/L   Alkaline Phosphatase 38 38 - 126 U/L   Total Bilirubin 1.3 (H) 0.3 - 1.2 mg/dL    Comment: HEMOLYSIS AT THIS LEVEL MAY AFFECT RESULT   GFR calc non Af Amer 4 (L) >60 mL/min   GFR calc Af Amer 4 (L) >60 mL/min   Anion gap 18 (H) 5 - 15    Comment: Performed at [facility], [address] [place], [place] [address]  CBC with Differential     Status: Abnormal   Collection Time: 11/30/18  8:07 AM  Result Value Ref Range   WBC 8.7 4.0 - 10.5 K/uL   RBC 1.97 (L) 4.22 - 5.81 MIL/uL   Hemoglobin 7.0 (L) 13.0 - 17.0 g/dL   HCT 22.1 (L) 39.0 - 52.0 %   MCV 112.2 (H) 80.0 - 100.0 fL   MCH 35.5 (H) 26.0 - 34.0 pg   MCHC 31.7 30.0 - 36.0 g/dL   RDW 14.7 11.5 - 15.5 %   Platelets 150 150 - 400 K/uL   nRBC 0.0 0.0 - 0.2 %   Neutrophils Relative % 83 %   Neutro Abs 7.1 1.7 - 7.7 K/uL   Lymphocytes Relative 7 %   Lymphs Abs 0.6 (L) 0.7 - 4.0 K/uL   Monocytes Relative 5 %   Monocytes Absolute 0.4 0.1 - 1.0 K/uL   Eosinophils Relative 5 %   Eosinophils Absolute 0.5 0.0 - 0.5 K/uL   Basophils Relative 0 %   Basophils Absolute 0.0 0.0 - 0.1 K/uL   WBC Morphology MORPHOLOGY UNREMARKABLE    RBC Morphology MORPHOLOGY UNREMARKABLE    Smear Review Normal platelet morphology    Immature Granulocytes 0 %   Abs Immature Granulocytes 0.03 0.00 - 0.07 K/uL    Comment: Performed at [facility], [address] [place], [place] [address]   Dg Chest Portable 1 View  Result Date: 11/30/2018 CLINICAL DATA:  Shortness of breath EXAM: PORTABLE CHEST 1 VIEW COMPARISON:  11/15/2018 FINDINGS: Cardiac shadow is prominent. Aortic calcifications are again seen. Diffuse vascular congestion is noted without interstitial edema  likely related to volume overload. This has increased in the interval from the prior exam. No bony abnormality is seen. IMPRESSION: Increasing vascular congestion when compared  with the prior exam. Electronically Signed   By: Inez Catalina M.D.   On: 11/30/2018 07:17    Pending Labs Unresulted Labs (From admission, onward)    Start     Ordered   12/01/18 0500  CBC  Tomorrow morning,   STAT     11/30/18 1015   12/01/18 0500  Comprehensive metabolic panel  Tomorrow morning,   STAT     11/30/18 1015   12/01/18 0500  Magnesium  Tomorrow morning,   STAT     11/30/18 1015   12/01/18 0500  Phosphorus  Tomorrow morning,   STAT     11/30/18 1015   Signed and Held  Phosphorus  Once,   R     Signed and Held   Signed and Held  Ferritin  Once,   R     Signed and Held   Signed and Held  Iron and TIBC  Once,   R     Signed and Held   Signed and Held  Parathyroid hormone, intact (no Ca)  Once,   R     Signed and Held   Signed and Held  Magnesium  Once,   R     Signed and Held          Vitals/Pain Today's Vitals   11/30/18 0900 11/30/18 0930 11/30/18 1000 11/30/18 1045  BP: (!) 156/97 (!) 155/88 (!) 160/90   Pulse: (!) 111 (!) 113 (!) 109   Resp: (!) 23 (!) 24 (!) 22   SpO2: 96% 97% 98%   Weight:      Height:      PainSc:    Asleep    Isolation Precautions No active isolations  Medications Medications  Chlorhexidine Gluconate Cloth 2 % PADS 6 each (has no administration in time range)  epoetin alfa (EPOGEN,PROCRIT) injection 4,000 Units (has no administration in time range)  acetaminophen (TYLENOL) tablet 650 mg (has no administration in time range)  doxycycline (VIBRAMYCIN) capsule 100 mg (has no administration in time range)  amLODipine (NORVASC) tablet 10 mg (has no administration in time range)  sevelamer carbonate (RENVELA) tablet 1,600 mg (has no administration in time range)  Darbepoetin Alfa (ARANESP) injection 60 mcg (has no administration in time range)  folic acid  (FOLVITE) tablet 1 mg (has no administration in time range)  thiamine tablet 100 mg (has no administration in time range)  albuterol (PROVENTIL HFA;VENTOLIN HFA) 108 (90 Base) MCG/ACT inhaler 2 puff (has no administration in time range)  diphenhydrAMINE (BENADRYL) injection 25 mg (has no administration in time range)  hydrOXYzine (ATARAX/VISTARIL) tablet 50 mg (has no administration in time range)  multivitamin with minerals tablet 1 tablet (has no administration in time range)  methylPREDNISolone sodium succinate (SOLU-MEDROL) 125 mg/2 mL injection 60 mg (has no administration in time range)  famotidine (PEPCID) IVPB 20 mg premix (has no administration in time range)  nicotine (NICODERM CQ - dosed in mg/24 hours) patch 14 mg (has no administration in time range)  albuterol (PROVENTIL) (2.5 MG/3ML) 0.083% nebulizer solution 5 mg (5 mg Nebulization Given 11/30/18 0800)  methylPREDNISolone sodium succinate (SOLU-MEDROL) 125 mg/2 mL injection 125 mg (125 mg Intravenous Given 11/30/18 0645)  famotidine (PEPCID) IVPB 20 mg premix (0 mg Intravenous Stopped 11/30/18 0715)  diphenhydrAMINE (BENADRYL) injection 50 mg (50 mg Intravenous Given 11/30/18 0655)  EPINEPHrine (EPI-PEN) injection 0.3 mg (0.3 mg Intramuscular Given 11/30/18 0645)  LORazepam (ATIVAN) injection 1 mg (1 mg Intravenous Given 11/30/18 0848)  diphenhydrAMINE (BENADRYL) injection  50 mg (50 mg Intravenous Given 11/30/18 0848)    Mobility walks Low fall risk   Focused Assessments Pulmonary Assessment Handoff:  Lung sounds:   O2 Device: Nasal Cannula O2 Flow Rate (L/min): 2 L/min      R Recommendations: See Admitting Provider Note  Report given to:   Additional Notes: N/A

## 2018-11-30 NOTE — Plan of Care (Signed)
  Problem: Activity: Goal: Risk for activity intolerance will decrease Outcome: Progressing   

## 2018-11-30 NOTE — Progress Notes (Signed)
   11/30/18 1515  Vital Signs  Pulse Rate Source Monitor  BP Location Right Arm  BP Method Automatic  Patient Position (if appropriate) Lying  During Hemodialysis Assessment  Blood Flow Rate (mL/min) 400 mL/min  Arterial Pressure (mmHg) -160 mmHg  Venous Pressure (mmHg) 200 mmHg  Transmembrane Pressure (mmHg) 70 mmHg  Ultrafiltration Rate (mL/min) 860 mL/min  Dialysate Flow Rate (mL/min) 800 ml/min  Conductivity: Machine  13.5  HD Safety Checks Performed Yes  Intra-Hemodialysis Comments Progressing as prescribed   Pt sleeping comfortably

## 2018-11-30 NOTE — H&P (Addendum)
[facility] [place] NAME: Jordan [name]    MR#:  [id number]  DATE OF BIRTH:  August 30, 1966  DATE OF ADMISSION:  11/30/2018  PRIMARY CARE PHYSICIAN: Lorelee Market, MD   REQUESTING/REFERRING PHYSICIAN: Dr. Lenise Arena  CHIEF COMPLAINT:   Chief Complaint  Patient presents with  . Allergic Reaction    HISTORY OF PRESENT ILLNESS:  Jordan [name]  is a 53 y.o. male with a known history of end-stage renal disease on hemodialysis on Mondays, Wednesdays and Fridays, hypertension, alcohol abuse and anemia of chronic disease who presented to the emergency room via EMS with complaints of facial swelling and itching.  Was felt to be having an allergic reaction.  Etiology unclear.  Was given DuoNeb in route.  Patient denies any new exposures.  No dizziness.  No chest pain.  Symptoms started gradually.  No aggravating or relieving factors.  Patient was already given intramuscular epinephrine to the right thigh.  Laboratory studies done in the emergency room.  There was concern that patient might be uremic.  Last hemodialysis was 4 days prior to admission.  Patient already seen by nephrology service prior to my evaluation.  Patient also reported to have developed hematoma in the right eye at the site of epinephrine administration.  Patient already given a dose of IV Solu-Medrol as well as Pepcid and Benadryl.  Patient being admitted to medical service for further evaluation and management.  PAST MEDICAL HISTORY:   Past Medical History:  Diagnosis Date  . Anemia   . CKD (chronic kidney disease)    Stage 5  Dialysis - M/W/F  . ETOH abuse   . Hypertension     PAST SURGICAL HISTORY:   Past Surgical History:  Procedure Laterality Date  . BASCILIC VEIN TRANSPOSITION Left 08/07/2016   Procedure: LEFT BASILIC VEIN TRANSPOSITION;  Surgeon: Angelia Mould, MD;  Location: [facility];  Service: Vascular;  Laterality: Left;  . COLONOSCOPY N/A  08/12/2016   Procedure: COLONOSCOPY;  Surgeon: Otis Brace, MD;  Location: [facility];  Service: Gastroenterology;  Laterality: N/A;  . ESOPHAGOGASTRODUODENOSCOPY N/A 08/12/2016   Procedure: ESOPHAGOGASTRODUODENOSCOPY (EGD);  Surgeon: Otis Brace, MD;  Location: [facility];  Service: Gastroenterology;  Laterality: N/A;  . INSERTION OF DIALYSIS CATHETER N/A 08/07/2016   Procedure: INSERTION OF TUNNELED DIALYSIS CATHETER;  Surgeon: Angelia Mould, MD;  Location: [facility];  Service: Vascular;  Laterality: N/A;  . LIGATION OF ARTERIOVENOUS  FISTULA Left 09/12/2016   Procedure: BANDING OF LEFT  ARTERIOVENOUS  FISTULA;  Surgeon: Angelia Mould, MD;  Location: [facility];  Service: Vascular;  Laterality: Left;  . NO PAST SURGERIES      SOCIAL HISTORY:   Social History   Tobacco Use  . Smoking status: Current Every Day Smoker    Packs/day: 0.05    Years: 15.00    Pack years: 0.75    Types: Cigarettes  . Smokeless tobacco: Never Used  . Tobacco comment: down to 1 a day  Substance Use Topics  . Alcohol use: Yes    Alcohol/week: 21.0 standard drinks    Types: 21 Cans of beer per week    Comment: none since 08/04/16    FAMILY HISTORY:   Family History  Problem Relation Age of Onset  . Diabetes Mother   . Kidney failure Mother   . Kidney failure Brother   . Post-traumatic stress disorder Neg Hx   . Bladder Cancer Neg Hx   . Kidney cancer  Neg Hx     DRUG ALLERGIES:  No Known Allergies  REVIEW OF SYSTEMS:   Review of Systems  Constitutional: Negative for chills and fever.  HENT: Negative for hearing loss and tinnitus.   Eyes: Negative for blurred vision, double vision and photophobia.  Respiratory: Positive for cough and shortness of breath.        Mild wheezing  Cardiovascular: Negative for chest pain and orthopnea.  Gastrointestinal: Negative for abdominal pain, heartburn, nausea and vomiting.  Genitourinary: Negative for dysuria and urgency.    Musculoskeletal: Negative for back pain and myalgias.  Skin: Positive for itching and rash.  Neurological: Negative for dizziness and headaches.  Psychiatric/Behavioral: Negative for depression and suicidal ideas.    MEDICATIONS AT HOME:   Prior to Admission medications   Medication Sig Start Date End Date Taking? Authorizing Provider  acetaminophen (TYLENOL) 325 MG tablet Take 2 tablets (650 mg total) by mouth every 6 (six) hours as needed for mild pain (or Fever >/= 101). Patient taking differently: Take 650 mg by mouth every 4 (four) hours as needed for mild pain.  05/17/18  Yes Cherene Altes, MD  albuterol (PROVENTIL HFA;VENTOLIN HFA) 108 (90 Base) MCG/ACT inhaler Inhale 2 puffs into the lungs every 4 (four) hours as needed for wheezing or shortness of breath. 10/03/18  Yes Pollina, Gwenyth Allegra, MD  diphenhydrAMINE (BENADRYL) 25 MG tablet Take 25 mg by mouth every 6 (six) hours as needed for itching.   Yes [provider]  doxycycline (VIBRAMYCIN) 100 MG capsule Take 1 capsule (100 mg total) by mouth 2 (two) times daily. 10/03/18  Yes Pollina, Gwenyth Allegra, MD  hydrOXYzine (ATARAX/VISTARIL) 50 MG tablet Take 50 mg by mouth 3 (three) times daily as needed for itching.   Yes [provider]  RENVELA 800 MG tablet Take 2,400 mg by mouth 3 (three) times daily with meals.  01/06/18  Yes [provider]  thiamine 100 MG tablet Take 1 tablet (100 mg total) by mouth daily. 03/19/18  Yes Alphonzo Grieve, MD  amLODipine (NORVASC) 10 MG tablet Take 1 tablet (10 mg total) by mouth daily. Patient not taking: Reported on 10/03/2018 05/17/18   Cherene Altes, MD  Darbepoetin Alfa (ARANESP) 60 MCG/0.3ML SOSY injection Inject 0.3 mLs (60 mcg total) into the vein every Monday with hemodialysis. Patient not taking: Reported on 11/30/2018 05/24/18   Cherene Altes, MD  folic acid (FOLVITE) 1 MG tablet Take 1 tablet (1 mg total) by mouth daily. Patient not taking: Reported on  10/03/2018 03/19/18   Alphonzo Grieve, MD  gabapentin (NEURONTIN) 100 MG capsule Take 100 mg by mouth 3 (three) times daily as needed (itching).    [provider]  hydrOXYzine (ATARAX/VISTARIL) 25 MG tablet Take 2 tablets (50 mg total) by mouth every 8 (eight) hours as needed. Patient not taking: Reported on 10/03/2018 09/10/18   Ward, Delice Bison, DO  loperamide (IMODIUM) 2 MG capsule Take 4 mg by mouth as needed for diarrhea or loose stools.    [provider]  predniSONE (DELTASONE) 20 MG tablet Take 2 tablets (40 mg total) by mouth daily with breakfast. Patient not taking: Reported on 11/30/2018 10/03/18   Orpah Greek, MD      VITAL SIGNS:  Blood pressure (!) 145/89, pulse (!) 108, temperature 98.6 F (37 C), temperature source Oral, resp. rate 20, height 5\' 4"  (1.626 m), weight 69.3 kg, SpO2 96 %.  PHYSICAL EXAMINATION:  Physical Exam  GENERAL:  53 y.o.-year-old  patient lying in the bed with no acute distress.  EYES: Pupils equal, round, reactive to light and accommodation. No scleral icterus. Extraocular muscles intact.  HEENT: Head atraumatic, normocephalic. Oropharynx and nasopharynx clear.  NECK:  Supple, no jugular venous distention. No thyroid enlargement, no tenderness.  LUNGS: Normal breath sounds bilaterally, no wheezing, rales,rhonchi or crepitation. No use of accessory muscles of respiration.  CARDIOVASCULAR: S1, S2 normal. No murmurs, rubs, or gallops.  ABDOMEN: Soft, nontender, nondistended. Bowel sounds present. No organomegaly or mass.  EXTREMITIES: No pedal edema, cyanosis, or clubbing.  Noted swelling and tenderness over the right eye at the site of epinephrine administration concerning for intramuscular hematoma. NEUROLOGIC: Cranial nerves II through XII are intact. Muscle strength 5/5 in all extremities. Sensation intact. Gait not checked.  PSYCHIATRIC: The patient is alert and oriented x 3.  SKIN: Mild pruritic papular rash diffusely.   Gradually improving with epinephrine administration.  No ulcers.Marland Kitchen   LABORATORY PANEL:   CBC Recent Labs  Lab 11/30/18 0807  WBC 8.7  HGB 7.0*  HCT 22.1*  PLT 150   ------------------------------------------------------------------------------------------------------------------  Chemistries  Recent Labs  Lab 11/30/18 0807  NA 139  K 5.4*  CL 99  CO2 22  GLUCOSE 100*  BUN 74*  CREATININE 13.63*  CALCIUM 9.7  AST 37  ALT 22  ALKPHOS 38  BILITOT 1.3*   ------------------------------------------------------------------------------------------------------------------  Cardiac Enzymes No results for input(s): TROPONINI in the last 168 hours. ------------------------------------------------------------------------------------------------------------------  RADIOLOGY:  Dg Chest Portable 1 View  Result Date: 11/30/2018 CLINICAL DATA:  Shortness of breath EXAM: PORTABLE CHEST 1 VIEW COMPARISON:  11/15/2018 FINDINGS: Cardiac shadow is prominent. Aortic calcifications are again seen. Diffuse vascular congestion is noted without interstitial edema likely related to volume overload. This has increased in the interval from the prior exam. No bony abnormality is seen. IMPRESSION: Increasing vascular congestion when compared with the prior exam. Electronically Signed   By: Inez Catalina M.D.   On: 11/30/2018 07:17      IMPRESSION AND PLAN:   1.  Allergic reaction Etiology not clear.  Patient cannot remember any exposure. Was already given a dose of intramuscular epinephrine. Placed on IV Solu-Medrol.  Pepcid and as needed Benadryl. Clinically no evidence of any airway compromise. Monitor clinically  2.  Intramuscular hematoma in the right thigh Secondary to epinephrine injection.  Avoid blood thinners.  Monitor clinically  3.  End-stage renal disease on hemodialysis Last hemodialysis 4 days ago. Already seen by nephrologist plans for dialysis due to concern of patient being  slightly uremic.  4.  Mild hyperkalemia with potassium of 5.4 To be treated with hemodialysis today  5.  Anemia of chronic disease Related to end-stage renal disease.  Continue Aranesp.  6.  History of alcohol abuse. No evidence of intoxication or withdrawal at this time. Continue thiamine, folic acid and multivitamin  DVT prophylaxis; SCDs No heparin products due to intramuscular hematoma.    All the records are reviewed and case discussed with ED provider. Management plans discussed with the patient, family and they are in agreement.  CODE STATUS: 50  TOTAL TIME TAKING CARE OF THIS PATIENT: 2 minutes.    Eboni Coval M.D on 11/30/2018 at 2:40 PM  Between 7am to 6pm - Pager - [phone]  After 6pm go to [email or web address] - Proofreader  [facility]  Office  [phone]  CC: Primary care physician; Lorelee Market, MD   Note: This dictation was prepared with Dragon dictation along with smaller  Company secretary. Any transcriptional errors that result from this process are unintentional.

## 2018-11-30 NOTE — Progress Notes (Signed)
   11/30/18 1735  Neurological  Level of Consciousness Alert  Orientation Level Oriented X4  Respiratory  Respiratory Pattern Regular;Labored  Chest Assessment Chest expansion symmetrical  R Upper  Breath Sounds Clear  L Upper Breath Sounds Clear  R Lower Breath Sounds Inspiratory wheezes  L Lower Breath Sounds Inspiratory wheezes  Cough Non-productive  Cardiac  Heart Sounds S1, S2  Jugular Venous Distention (JVD) No  Cardiac Rhythm ST  Vascular  R Radial Pulse +2  L Radial Pulse +2  R Dorsalis Pedis Pulse +1  L Dorsalis Pedis Pulse +1  Musculoskeletal  Musculoskeletal (WDL) WDL  Generalized Weakness Yes  Psychosocial  Psychosocial (WDL) WDL   Post dialysis assessment

## 2018-11-30 NOTE — ED Triage Notes (Signed)
Pt arrived via [facility] from kidney center with c/o allergic reaction. EMS states pt was there to for dialysis when he started feeling SHOB and itching all over. EMS states that pt was having some expiratory wheezing so initiated a duoneb en route. EMS states that he is on a new medication but is unknown what it is. Pt has some bloody sputum as well as some hives on body. Pt had dialysis on Friday.

## 2018-11-30 NOTE — ED Provider Notes (Signed)
[facility] Emergency Department Provider Note  ____________________________________________  Time seen: Approximately 7:27 AM  I have reviewed the triage vital signs and the nursing notes.   HISTORY  Chief Complaint Allergic Reaction    HPI Jordan [name] is a 53 y.o. male with a history of end-stage renal disease on hemodialysis Monday with her Friday, hypertension, alcohol abuse who last had dialysis on Friday 4 days ago.  He went to dialysis this morning but on arrival there was noted to have facial swelling and itching and was sent to the ED for evaluation.  EMS note that the patient also was itchy so they gave him a DuoNeb en route.  Patient denies any new exposures or known allergens.  Does complain of shortness of breath and itching.  No dizziness or chest pain.  Symptoms are gradual onset, constant, no aggravating or alleviating factors.      Past Medical History:  Diagnosis Date  . Anemia   . CKD (chronic kidney disease)    Stage 5  Dialysis - M/W/F  . ETOH abuse   . Hypertension      Patient Active Problem List   Diagnosis Date Noted  . Pulmonary edema 07/18/2018  . Anemia in ESRD (end-stage renal disease) ([place]) 05/17/2018  . Hepatitis B 03/19/2018  . Encephalopathy 03/19/2018  . 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 History of macrocytic anemia 03/19/2018  . History of iron deficiency anemia 03/19/2018  . ESRD on dialysis ([place]) 05/26/2017  . Essential hypertension 05/26/2017  . Gastritis with hemorrhage 05/26/2017  . Tobacco abuse 08/04/2016  . ETOH abuse 08/04/2016  . Uncontrolled hypertension 08/04/2016  . Acute renal failure (ARF) ([place]) 08/04/2016  . Symptomatic anemia 08/04/2016     Past Surgical History:  Procedure Laterality Date  . BASCILIC VEIN TRANSPOSITION Left 08/07/2016   Procedure: LEFT BASILIC VEIN TRANSPOSITION;  Surgeon: Angelia Mould, MD;  Location: [facility];  Service: Vascular;  Laterality:  Left;  . COLONOSCOPY N/A 08/12/2016   Procedure: COLONOSCOPY;  Surgeon: Otis Brace, MD;  Location: [facility];  Service: Gastroenterology;  Laterality: N/A;  . ESOPHAGOGASTRODUODENOSCOPY N/A 08/12/2016   Procedure: ESOPHAGOGASTRODUODENOSCOPY (EGD);  Surgeon: Otis Brace, MD;  Location: [facility];  Service: Gastroenterology;  Laterality: N/A;  . INSERTION OF DIALYSIS CATHETER N/A 08/07/2016   Procedure: INSERTION OF TUNNELED DIALYSIS CATHETER;  Surgeon: Angelia Mould, MD;  Location: [facility];  Service: Vascular;  Laterality: N/A;  . LIGATION OF ARTERIOVENOUS  FISTULA Left 09/12/2016   Procedure: BANDING OF LEFT  ARTERIOVENOUS  FISTULA;  Surgeon: Angelia Mould, MD;  Location: [facility];  Service: Vascular;  Laterality: Left;  . NO PAST SURGERIES       Prior to Admission medications   Medication Sig Start Date End Date Taking? Authorizing Provider  acetaminophen (TYLENOL) 325 MG tablet Take 2 tablets (650 mg total) by mouth every 6 (six) hours as needed for mild pain (or Fever >/= 101). 05/17/18   Cherene Altes, MD  albuterol (PROVENTIL HFA;VENTOLIN HFA) 108 (90 Base) MCG/ACT inhaler Inhale 2 puffs into the lungs every 4 (four) hours as needed for wheezing or shortness of breath. 10/03/18   Orpah Greek, MD  amLODipine (NORVASC) 10 MG tablet Take 1 tablet (10 mg total) by mouth daily. Patient not taking: Reported on 10/03/2018 05/17/18   Cherene Altes, MD  Darbepoetin Alfa (ARANESP) 60 MCG/0.3ML SOSY injection Inject 0.3 mLs (60 mcg total) into the vein every Monday with  hemodialysis. Patient not taking: Reported on 10/03/2018 05/24/18   Cherene Altes, MD  diphenhydrAMINE (BENADRYL) 25 MG tablet Take 25 mg by mouth every 6 (six) hours as needed for itching.    [provider]  doxycycline (VIBRAMYCIN) 100 MG capsule Take 1 capsule (100 mg total) by mouth 2 (two) times daily. 10/03/18   Orpah Greek, MD  folic acid (FOLVITE) 1 MG tablet  Take 1 tablet (1 mg total) by mouth daily. Patient not taking: Reported on 10/03/2018 03/19/18   Alphonzo Grieve, MD  hydrOXYzine (ATARAX/VISTARIL) 25 MG tablet Take 2 tablets (50 mg total) by mouth every 8 (eight) hours as needed. Patient not taking: Reported on 10/03/2018 09/10/18   Ward, Delice Bison, DO  predniSONE (DELTASONE) 20 MG tablet Take 2 tablets (40 mg total) by mouth daily with breakfast. 10/03/18   Pollina, Gwenyth Allegra, MD  RENVELA 800 MG tablet Take 1,600 mg by mouth 3 (three) times daily with meals.  01/06/18   [provider]  thiamine 100 MG tablet Take 1 tablet (100 mg total) by mouth daily. Patient not taking: Reported on 10/03/2018 03/19/18   Alphonzo Grieve, MD     Allergies Patient has no known allergies.   Family History  Problem Relation Age of Onset  . Diabetes Mother   . Kidney failure Mother   . Kidney failure Brother   . Post-traumatic stress disorder Neg Hx   . Bladder Cancer Neg Hx   . Kidney cancer Neg Hx     Social History Social History   Tobacco Use  . Smoking status: Current Every Day Smoker    Packs/day: 0.05    Years: 15.00    Pack years: 0.75    Types: Cigarettes  . Smokeless tobacco: Never Used  . Tobacco comment: down to 1 a day  Substance Use Topics  . Alcohol use: Yes    Alcohol/week: 21.0 standard drinks    Types: 21 Cans of beer per week    Comment: none since 08/04/16  . Drug use: No    Review of Systems  Constitutional:   No fever or chills.  ENT:   No sore throat. No rhinorrhea.  Facial swelling as above Cardiovascular:   No chest pain or syncope. Respiratory: Positive shortness of breath without cough. Gastrointestinal:   Negative for abdominal pain, vomiting and diarrhea.  Musculoskeletal:   Negative for focal pain or swelling All other systems reviewed and are negative except as documented above in ROS and HPI.  ____________________________________________   PHYSICAL EXAM:  VITAL SIGNS: ED Triage Vitals   Enc Vitals Group     BP 11/30/18 0649 (!) 174/102     Pulse Rate 11/30/18 0649 (!) 110     Resp 11/30/18 0649 18     Temp --      Temp src --      SpO2 11/30/18 0649 96 %     Weight 11/30/18 0700 154 lb (69.9 kg)     Height 11/30/18 0700 5\' 4"  (1.626 m)     Head Circumference --      Peak Flow --      Pain Score 11/30/18 0700 7     Pain Loc --      Pain Edu? --      Excl. in [place]? --     Vital signs reviewed, nursing assessments reviewed.   Constitutional:   Alert and oriented. Non-toxic appearance. Eyes:   Conjunctivae are normal. EOMI. PERRL. ENT  Head:   Normocephalic and atraumatic.  Diffuse facial swelling, worse in periorbital face bilaterally      Nose:   No congestion/rhinnorhea.       Mouth/Throat:   MMM, no pharyngeal erythema. No peritonsillar mass.       Neck:   No meningismus. Full ROM. Hematological/Lymphatic/Immunilogical:   No cervical lymphadenopathy. Cardiovascular:   Tachycardia heart rate 110. Symmetric bilateral radial and DP pulses.  No murmurs. Cap refill less than 2 seconds. Respiratory:   Normal respiratory effort without tachypnea/retractions.  Diffuse expiratory wheezing . Gastrointestinal:   Soft and nontender. Non distended. There is no CVA tenderness.  No rebound, rigidity, or guarding. Musculoskeletal:   Normal range of motion in all extremities. No joint effusions.  No lower extremity tenderness.  No edema. Neurologic:   Normal speech and language.  Motor grossly intact. No acute focal neurologic deficits are appreciated.  Skin:    Skin is warm, dry and intact. No rash noted.  No petechiae, purpura, or bullae.  ____________________________________________    LABS (pertinent positives/negatives) (all labs ordered are listed, but only abnormal results are displayed) Labs Reviewed  COMPREHENSIVE METABOLIC PANEL  CBC WITH DIFFERENTIAL/PLATELET   ____________________________________________   EKG  Interpreted by me Sinus tachycardia  rate 110, right axis, normal intervals.  Normal QRS ST segments and T waves.  ____________________________________________    [id number]  Dg Chest Portable 1 View  Result Date: 11/30/2018 CLINICAL DATA:  Shortness of breath EXAM: PORTABLE CHEST 1 VIEW COMPARISON:  11/15/2018 FINDINGS: Cardiac shadow is prominent. Aortic calcifications are again seen. Diffuse vascular congestion is noted without interstitial edema likely related to volume overload. This has increased in the interval from the prior exam. No bony abnormality is seen. IMPRESSION: Increasing vascular congestion when compared with the prior exam. Electronically Signed   By: Inez Catalina M.D.   On: 11/30/2018 07:17    ____________________________________________   PROCEDURES .Critical Care Performed by: Carrie Mew, MD Authorized by: Carrie Mew, MD   Critical care provider statement:    Critical care time (minutes):  30   Critical care time was exclusive of:  Separately billable procedures and treating other patients   Critical care was necessary to treat or prevent imminent or life-threatening deterioration of the following conditions:  Circulatory failure   Critical care was time spent personally by me on the following activities:  Development of treatment plan with patient or surrogate, discussions with consultants, evaluation of patient's response to treatment, examination of patient, obtaining history from patient or surrogate, ordering and performing treatments and interventions, ordering and review of laboratory studies, ordering and review of radiographic studies, pulse oximetry, re-evaluation of patient's condition and review of old charts    ____________________________________________  DIFFERENTIAL DIAGNOSIS   Anaphylactic reaction, pulmonary edema, pneumonia, electrolyte abnormality  CLINICAL IMPRESSION / ASSESSMENT AND PLAN / ED COURSE  Medications ordered in the ED: Medications  albuterol  (PROVENTIL) (2.5 MG/3ML) 0.083% nebulizer solution 5 mg (has no administration in time range)  methylPREDNISolone sodium succinate (SOLU-MEDROL) 125 mg/2 mL injection 125 mg (125 mg Intravenous Given 11/30/18 0645)  famotidine (PEPCID) IVPB 20 mg premix (20 mg Intravenous New Bag/Given 11/30/18 0645)  diphenhydrAMINE (BENADRYL) injection 50 mg (50 mg Intravenous Given 11/30/18 0655)  EPINEPHrine (EPI-PEN) injection 0.3 mg (0.3 mg Intramuscular Given 11/30/18 0645)    Pertinent labs & imaging results that were available during my care of the patient were reviewed by me and considered in my medical decision making (see chart for details).  Patient presents with facial swelling, itching, expiratory wheezing consistent with anaphylactic reaction.  Unable to identify exposure.  Ordered prednisone Solu-Medrol Pepcid and EpiPen, labs and chest x-ray.  EKG unremarkable.      ____________________________________________   FINAL CLINICAL IMPRESSION(S) / ED DIAGNOSES    Final diagnoses:  Anaphylaxis, initial encounter  End-stage renal disease on hemodialysis [facility])     ED Discharge Orders    None      Portions of this note were generated with dragon dictation software. Dictation errors may occur despite best attempts at proofreading.   Carrie Mew, MD 11/30/18 [phone]

## 2018-11-30 NOTE — ED Notes (Signed)
Hospitalist to bedside.

## 2018-11-30 NOTE — Consult Note (Signed)
Date: 11/30/2018                  Patient Name:  Jordan [name]  MRN: [id number]  DOB: 1966-09-06  Age / Sex: 53 y.o., male         PCP: Lorelee Market, MD                 Service Requesting Consult: IM/ Otila Back, MD                 Reason for Consult: uremia            History of Present Illness: Patient is a 53 y.o. male with medical problems of end-stage renal disease, who was admitted to [facility] on 11/30/2018 for evaluation of excessive itching.  Initially it was thought patient might be having an allergic reaction therefore he was treated with albuterol, Solu-Medrol, Benadryl and EpiPen. Patient states that he has been on dialysis for 3-1/2 years.  He dialyzes at [facility].  Monday, Wednesday, Friday shift.  His last dialysis was on Friday according to patient.  He states he missed his dialysis yesterday because of excessive itching.  Patient is very sleepy at present because of Benadryl and not able to provide detailed information.   Medications: Outpatient medications: (Not in a hospital admission)   Current medications: No current facility-administered medications for this encounter.    Current Outpatient Medications  Medication Sig Dispense Refill  . acetaminophen (TYLENOL) 325 MG tablet Take 2 tablets (650 mg total) by mouth every 6 (six) hours as needed for mild pain (or Fever >/= 101).    Marland Kitchen albuterol (PROVENTIL HFA;VENTOLIN HFA) 108 (90 Base) MCG/ACT inhaler Inhale 2 puffs into the lungs every 4 (four) hours as needed for wheezing or shortness of breath. 1 Inhaler 2  . amLODipine (NORVASC) 10 MG tablet Take 1 tablet (10 mg total) by mouth daily. (Patient not taking: Reported on 10/03/2018) 30 tablet 0  . Darbepoetin Alfa (ARANESP) 60 MCG/0.3ML SOSY injection Inject 0.3 mLs (60 mcg total) into the vein every Monday with hemodialysis. (Patient not taking: Reported on 10/03/2018) 4.2 mL   . diphenhydrAMINE (BENADRYL) 25 MG tablet Take 25 mg by mouth every 6 (six)  hours as needed for itching.    Marland Kitchen doxycycline (VIBRAMYCIN) 100 MG capsule Take 1 capsule (100 mg total) by mouth 2 (two) times daily. 20 capsule 0  . folic acid (FOLVITE) 1 MG tablet Take 1 tablet (1 mg total) by mouth daily. (Patient not taking: Reported on 10/03/2018) 30 tablet 0  . hydrOXYzine (ATARAX/VISTARIL) 25 MG tablet Take 2 tablets (50 mg total) by mouth every 8 (eight) hours as needed. (Patient not taking: Reported on 10/03/2018) 30 tablet 0  . predniSONE (DELTASONE) 20 MG tablet Take 2 tablets (40 mg total) by mouth daily with breakfast. 10 tablet 0  . RENVELA 800 MG tablet Take 1,600 mg by mouth 3 (three) times daily with meals.   6  . thiamine 100 MG tablet Take 1 tablet (100 mg total) by mouth daily. (Patient not taking: Reported on 10/03/2018) 30 tablet 0      Allergies: No Known Allergies    Past Medical History: Past Medical History:  Diagnosis Date  . Anemia   . CKD (chronic kidney disease)    Stage 5  Dialysis - M/W/F  . ETOH abuse   . Hypertension      Past Surgical History: Past Surgical History:  Procedure Laterality Date  . BASCILIC  VEIN TRANSPOSITION Left 08/07/2016   Procedure: LEFT BASILIC VEIN TRANSPOSITION;  Surgeon: Angelia Mould, MD;  Location: [place];  Service: Vascular;  Laterality: Left;  . COLONOSCOPY N/A 08/12/2016   Procedure: COLONOSCOPY;  Surgeon: Otis Brace, MD;  Location: [place];  Service: Gastroenterology;  Laterality: N/A;  . ESOPHAGOGASTRODUODENOSCOPY N/A 08/12/2016   Procedure: ESOPHAGOGASTRODUODENOSCOPY (EGD);  Surgeon: Otis Brace, MD;  Location: [facility];  Service: Gastroenterology;  Laterality: N/A;  . INSERTION OF DIALYSIS CATHETER N/A 08/07/2016   Procedure: INSERTION OF TUNNELED DIALYSIS CATHETER;  Surgeon: Angelia Mould, MD;  Location: [place];  Service: Vascular;  Laterality: N/A;  . LIGATION OF ARTERIOVENOUS  FISTULA Left 09/12/2016   Procedure: BANDING OF LEFT  ARTERIOVENOUS  FISTULA;  Surgeon:  Angelia Mould, MD;  Location: [facility] OR;  Service: Vascular;  Laterality: Left;  . NO PAST SURGERIES       Family History: Family History  Problem Relation Age of Onset  . Diabetes Mother   . Kidney failure Mother   . Kidney failure Brother   . Post-traumatic stress disorder Neg Hx   . Bladder Cancer Neg Hx   . Kidney cancer Neg Hx      Social History: Social History   Socioeconomic History  . Marital status: Single    Spouse name: Not on file  . Number of children: Not on file  . Years of education: Not on file  . Highest education level: Not on file  Occupational History  . Not on file  Social Needs  . Financial resource strain: Not on file  . Food insecurity:    Worry: Not on file    Inability: Not on file  . Transportation needs:    Medical: Not on file    Non-medical: Not on file  Tobacco Use  . Smoking status: Current Every Day Smoker    Packs/day: 0.05    Years: 15.00    Pack years: 0.75    Types: Cigarettes  . Smokeless tobacco: Never Used  . Tobacco comment: down to 1 a day  Substance and Sexual Activity  . Alcohol use: Yes    Alcohol/week: 21.0 standard drinks    Types: 21 Cans of beer per week    Comment: none since 08/04/16  . Drug use: No  . Sexual activity: Not on file  Lifestyle  . Physical activity:    Days per week: Not on file    Minutes per session: Not on file  . Stress: Not on file  Relationships  . Social connections:    Talks on phone: Not on file    Gets together: Not on file    Attends religious service: Not on file    Active member of club or organization: Not on file    Attends meetings of clubs or organizations: Not on file    Relationship status: Not on file  . Intimate partner violence:    Fear of current or ex partner: Not on file    Emotionally abused: Not on file    Physically abused: Not on file    Forced sexual activity: Not on file  Other Topics Concern  . Not on file  Social History Narrative  . Not on  file     Review of Systems: Not reliable Gen:  HEENT:  CV:  Resp:  GI: GU :  MS:  Derm:   Psych: Heme:  Neuro:  Endocrine  Vital Signs: Blood pressure (!) 160/97, pulse (!) 115, resp. rate Marland Kitchen)  21, height 5\' 4"  (1.626 m), weight 69.9 kg, SpO2 92 %.   Intake/Output Summary (Last 24 hours) at 11/30/2018 0931 Last data filed at 11/30/2018 0715 Gross per 24 hour  Intake 50 ml  Output -  Net 50 ml    Weight trends: [employer]   11/30/18 0700  Weight: 69.9 kg    Physical Exam: General:  No acute distress, laying in the bed  HEENT  moist oral mucous membranes  Neck:  No JVD  Lungs:  Mild diffuse wheezing, mild basilar crackles, 2 L nasal cannula  Heart::  Regular rhythm, sinus rhythm on telemetry  Abdomen:  Soft, Nontender  Extremities:  No peripheral edema  Neurologic:  Sleepy but arousable, able to answer few questions  Skin:  No rashes, excessive itching  Access:  Left upper extremity well-developed AV fistula, good bruit and thrill   Lab results: Basic Metabolic Panel: Recent Labs  Lab 11/30/18 0807  NA 139  K 5.4*  CL 99  CO2 22  GLUCOSE 100*  BUN 74*  CREATININE 13.63*  CALCIUM 9.7    Liver Function Tests: Recent Labs  Lab 11/30/18 0807  AST 37  ALT 22  ALKPHOS 38  BILITOT 1.3*  PROT 6.7  ALBUMIN 3.3*   No results for input(s): LIPASE, AMYLASE in the last 168 hours. No results for input(s): AMMONIA in the last 168 hours.  CBC: Recent Labs  Lab 11/30/18 0807  WBC 8.7  NEUTROABS 7.1  HGB 7.0*  HCT 22.1*  MCV 112.2*  PLT 150    Cardiac Enzymes: No results for input(s): CKTOTAL, TROPONINI in the last 168 hours.  BNP: Invalid input(s): POCBNP  CBG: No results for input(s): GLUCAP in the last 168 hours.  Microbiology: No results found for this or any previous visit (from the past 720 hour(s)).   Coagulation Studies: No results for input(s): LABPROT, INR in the last 72 hours.  Urinalysis: No results for input(s):  COLORURINE, LABSPEC, PHURINE, GLUCOSEU, HGBUR, BILIRUBINUR, KETONESUR, PROTEINUR, UROBILINOGEN, NITRITE, LEUKOCYTESUR in the last 72 hours.  Invalid input(s): APPERANCEUR      Imaging: Dg Chest Portable 1 View  Result Date: 11/30/2018 CLINICAL DATA:  Shortness of breath EXAM: PORTABLE CHEST 1 VIEW COMPARISON:  11/15/2018 FINDINGS: Cardiac shadow is prominent. Aortic calcifications are again seen. Diffuse vascular congestion is noted without interstitial edema likely related to volume overload. This has increased in the interval from the prior exam. No bony abnormality is seen. IMPRESSION: Increasing vascular congestion when compared with the prior exam. Electronically Signed   By: Inez Catalina M.D.   On: 11/30/2018 07:17      Assessment & Plan: Pt is a 53 y.o. African-American  male with ESRD, hypertension, was admitted on 11/30/2018 with excessive itching thought to be secondary to either allergic reaction or uremia for under dialysis  [address]/MWF  1.  Uremia in the setting of ESRD.  Patient missed his dialysis yesterday.  He has mild hyperkalemia and excessive itching which may be a manifestation of uremia.  We will arrange for hemodialysis today.  2.  Anemia of chronic kidney disease -Epogen supplementation with dialysis  3.  Secondary hyperparathyroidism -We will check phosphorus and PTH      LOS: 0 Marillyn Goren 3/10/20209:31 AM  [place], [place]  Note: This note was prepared with Dragon dictation. Any transcription errors are unintentional

## 2018-11-30 NOTE — ED Notes (Addendum)
Pt called out that he was itching too much and needed medications. PRN benadryl given. Pt sleeping at this time with audible snoring.

## 2018-11-30 NOTE — Progress Notes (Signed)
   11/30/18 1415  Vital Signs  Pulse Rate 96  Pulse Rate Source Monitor  BP Location Right Arm  BP Method Automatic  Patient Position (if appropriate) Lying  During Hemodialysis Assessment  Blood Flow Rate (mL/min) 400 mL/min  Arterial Pressure (mmHg) -120 mmHg  Venous Pressure (mmHg) 150 mmHg  Transmembrane Pressure (mmHg) 50 mmHg  Ultrafiltration Rate (mL/min) 860 mL/min  Dialysate Flow Rate (mL/min) 800 ml/min  Conductivity: Machine  13.8  HD Safety Checks Performed Yes  Intra-Hemodialysis Comments Progressing as prescribed   Pt c/o generalized body itchiness, last diphenhydramine  dose 11:30 am , PRN scheduled every 6 hrs, will notify MD.

## 2018-11-30 NOTE — Progress Notes (Signed)
Hemodialysis- Attempted to get patient for treatment. Patient wants to eat lunch. Will reattempt

## 2018-11-30 NOTE — Progress Notes (Signed)
   11/30/18 1545  Vital Signs  Pulse Rate Source Monitor  BP Location Right Arm  BP Method Automatic  Patient Position (if appropriate) Lying  During Hemodialysis Assessment  Blood Flow Rate (mL/min) 400 mL/min  Arterial Pressure (mmHg) -150 mmHg  Venous Pressure (mmHg) 220 mmHg  Transmembrane Pressure (mmHg) 70 mmHg  Ultrafiltration Rate (mL/min) 860 mL/min  Dialysate Flow Rate (mL/min) 800 ml/min  Conductivity: Machine  15.3  HD Safety Checks Performed Yes  Intra-Hemodialysis Comments Progressing as prescribed   Pt c/o of pain on the R leg (point of epi pen injection) , site swollen and tender, will notify primary RN to monitor.

## 2018-12-01 LAB — COMPREHENSIVE METABOLIC PANEL
ALT: 20 U/L (ref 0–44)
AST: 22 U/L (ref 15–41)
Albumin: 3.1 g/dL — ABNORMAL LOW (ref 3.5–5.0)
Alkaline Phosphatase: 42 U/L (ref 38–126)
Anion gap: 12 (ref 5–15)
BUN: 48 mg/dL — ABNORMAL HIGH (ref 6–20)
CO2: 31 mmol/L (ref 22–32)
Calcium: 8.8 mg/dL — ABNORMAL LOW (ref 8.9–10.3)
Chloride: 98 mmol/L (ref 98–111)
Creatinine, Ser: 8.36 mg/dL — ABNORMAL HIGH (ref 0.61–1.24)
GFR calc Af Amer: 8 mL/min — ABNORMAL LOW (ref >60)
GFR calc non Af Amer: 7 mL/min — ABNORMAL LOW (ref >60)
Glucose, Bld: 127 mg/dL — ABNORMAL HIGH (ref 70–99)
Potassium: 5 mmol/L (ref 3.5–5.1)
Sodium: 141 mmol/L (ref 135–145)
Total Bilirubin: 1.2 mg/dL (ref 0.3–1.2)
Total Protein: 6 g/dL — ABNORMAL LOW (ref 6.5–8.1)

## 2018-12-01 LAB — MAGNESIUM: Magnesium: 2.2 mg/dL (ref 1.7–2.4)

## 2018-12-01 LAB — CBC
HCT: 19.8 % — ABNORMAL LOW (ref 39.0–52.0)
Hemoglobin: 6.2 g/dL — ABNORMAL LOW (ref 13.0–17.0)
MCH: 35.2 pg — ABNORMAL HIGH (ref 26.0–34.0)
MCHC: 31.3 g/dL (ref 30.0–36.0)
MCV: 112.5 fL — ABNORMAL HIGH (ref 80.0–100.0)
Platelets: 139 10*3/uL — ABNORMAL LOW (ref 150–400)
RBC: 1.76 MIL/uL — ABNORMAL LOW (ref 4.22–5.81)
RDW: 14.7 % (ref 11.5–15.5)
WBC: 7.2 10*3/uL (ref 4.0–10.5)
nRBC: 0.3 % — ABNORMAL HIGH (ref 0.0–0.2)

## 2018-12-01 LAB — HEMOGLOBIN AND HEMATOCRIT, BLOOD
HCT: 20.1 % — ABNORMAL LOW (ref 39.0–52.0)
Hemoglobin: 6.3 g/dL — ABNORMAL LOW (ref 13.0–17.0)

## 2018-12-01 LAB — MRSA PCR SCREENING: MRSA by PCR: NEGATIVE

## 2018-12-01 LAB — PHOSPHORUS: Phosphorus: 5.2 mg/dL — ABNORMAL HIGH (ref 2.5–4.6)

## 2018-12-01 LAB — PREPARE RBC (CROSSMATCH)

## 2018-12-01 MED ORDER — VITAMIN B-1 100 MG PO TABS
100.0000 mg | ORAL_TABLET | Freq: Every day | ORAL | Status: DC
  Administered 2018-12-02 – 2018-12-03 (×2): 100 mg via ORAL
  Filled 2018-12-01 (×2): qty 1

## 2018-12-01 MED ORDER — LORAZEPAM 1 MG PO TABS: 1.0000 mg | ORAL_TABLET | Freq: Four times a day (QID) | ORAL | Status: DC | PRN

## 2018-12-01 MED ORDER — FOLIC ACID 1 MG PO TABS
1.0000 mg | ORAL_TABLET | Freq: Every day | ORAL | Status: DC
  Administered 2018-12-01 – 2018-12-03 (×3): 1 mg via ORAL
  Filled 2018-12-01 (×3): qty 1

## 2018-12-01 MED ORDER — ADULT MULTIVITAMIN W/MINERALS CH
1.0000 | ORAL_TABLET | Freq: Every day | ORAL | Status: DC
  Administered 2018-12-02 – 2018-12-03 (×2): 1 via ORAL
  Filled 2018-12-01 (×2): qty 1

## 2018-12-01 MED ORDER — LORAZEPAM 2 MG/ML IJ SOLN: 0.0000 mg | Freq: Two times a day (BID) | INTRAMUSCULAR | Status: DC

## 2018-12-01 MED ORDER — EPOETIN ALFA 10000 UNIT/ML IJ SOLN
4000.0000 [IU] | INTRAMUSCULAR | Status: DC
  Administered 2018-12-01 – 2018-12-03 (×2): 4000 [IU] via INTRAVENOUS

## 2018-12-01 MED ORDER — THIAMINE HCL 100 MG/ML IJ SOLN: 100.0000 mg | Freq: Every day | INTRAMUSCULAR | Status: DC

## 2018-12-01 MED ORDER — SODIUM CHLORIDE 0.9% IV SOLUTION: Freq: Once | INTRAVENOUS | Status: DC

## 2018-12-01 MED ORDER — LORAZEPAM 2 MG/ML IJ SOLN
0.0000 mg | Freq: Four times a day (QID) | INTRAMUSCULAR | Status: AC
  Administered 2018-12-02: 2 mg via INTRAVENOUS
  Filled 2018-12-01: qty 1

## 2018-12-01 MED ORDER — METHYLPREDNISOLONE SODIUM SUCC 40 MG IJ SOLR
40.0000 mg | Freq: Two times a day (BID) | INTRAMUSCULAR | Status: DC
  Administered 2018-12-01 – 2018-12-03 (×4): 40 mg via INTRAVENOUS
  Filled 2018-12-01 (×5): qty 1

## 2018-12-01 MED ORDER — LORAZEPAM 2 MG/ML IJ SOLN: 1.0000 mg | Freq: Four times a day (QID) | INTRAMUSCULAR | Status: DC | PRN

## 2018-12-01 NOTE — Progress Notes (Signed)
   12/01/18 2100  Neurological  Level of Consciousness Alert  Orientation Level Oriented X4  Respiratory  Respiratory Pattern Regular  Chest Assessment Chest expansion symmetrical  Bilateral Breath Sounds Diminished;Expiratory wheezes  Cough Productive  Sputum Amount Small  Sputum Color Hemoptysis;Clear (reported to night shift RN.spit 1x during tx,bright red)  Cardiac  Pulse Regular  Cardiac Rhythm ST  Ectopy Multifocal PVC's  Ectopy Frequency Occasional  Vascular  R Radial Pulse +2  L Radial Pulse +2  Integumentary  Integumentary (WDL) X  Skin Color Appropriate for ethnicity  Skin Condition Dry;Itching  Itching intervention  (see MAR - Nephrologist notified)  Musculoskeletal  Musculoskeletal (WDL) WDL  GU Assessment  Genitourinary (WDL) X  Genitourinary Symptoms  (HD pt)  Psychosocial  Psychosocial (WDL) WDL

## 2018-12-01 NOTE — Progress Notes (Signed)
HD Tx Start   12/01/18 1715  Vital Signs  Pulse Rate (!) 106  Resp (!) 24  BP (!) 156/97  Oxygen Therapy  SpO2 97 %  During Hemodialysis Assessment  Blood Flow Rate (mL/min) 400 mL/min  Arterial Pressure (mmHg) -180 mmHg  Venous Pressure (mmHg) 150 mmHg  Transmembrane Pressure (mmHg) 60 mmHg  Ultrafiltration Rate (mL/min) 810 mL/min  Dialysate Flow Rate (mL/min) 800 ml/min  Conductivity: Machine  14  HD Safety Checks Performed Yes  Dialysis Fluid Bolus Normal Saline  Bolus Amount (mL) 250 mL  Intra-Hemodialysis Comments Tx initiated

## 2018-12-01 NOTE — Progress Notes (Signed)
Post HD Tx   12/01/18 2100  Vital Signs  Pulse Rate (!) 107  Resp (!) 23  BP (!) 167/99  Oxygen Therapy  SpO2 100 %  Post-Hemodialysis Assessment  Rinseback Volume (mL) 250 mL  KECN 70.9 V  Dialyzer Clearance Lightly streaked  Duration of HD Treatment -hour(s) 3.5 hour(s)  Hemodialysis Intake (mL) 500 mL  UF Total -Machine (mL) 3000 mL  Net UF (mL) 2500 mL  Tolerated HD Treatment Yes  Post-Hemodialysis Comments pt stable post tx  AVG/AVF Arterial Site Held (minutes) 12 minutes  AVG/AVF Venous Site Held (minutes) 12 minutes  Fistula / Graft Left Upper arm Arteriovenous fistula  No Placement Date or Time found.   Orientation: Left  Access Location: Upper arm  Access Type: Arteriovenous fistula  Site Condition No complications  Fistula / Graft Assessment Present;Thrill;Bruit  Drainage Description None

## 2018-12-01 NOTE — Progress Notes (Signed)
HD Tx End   12/01/18 2045  Hand-Off documentation  Report given to (Full Name) Sharyn Lull RN 1C  Report received from (Full Name) Trellis Paganini RN  Vital Signs  Temp 98.6 F (37 C)  Temp Source Oral  Pulse Rate (!) 105  Resp (!) 21  BP (!) 182/93  BP Location Right Arm  BP Method Automatic  Patient Position (if appropriate) Lying  Oxygen Therapy  SpO2 98 %  O2 Device Room Air  Pain Assessment  Pain Scale 0-10  Pain Score 0  During Hemodialysis Assessment  Blood Flow Rate (mL/min) 200 mL/min  Arterial Pressure (mmHg) -160 mmHg  Venous Pressure (mmHg) 150 mmHg  Transmembrane Pressure (mmHg) 70 mmHg  Ultrafiltration Rate (mL/min) 810 mL/min  Dialysate Flow Rate (mL/min) 800 ml/min  Conductivity: Machine  14  HD Safety Checks Performed Yes  Dialysis Fluid Bolus Normal Saline  Bolus Amount (mL) 250 mL  Intra-Hemodialysis Comments Tx completed;Tolerated well  Fistula / Graft Left Upper arm Arteriovenous fistula  No Placement Date or Time found.   Orientation: Left  Access Location: Upper arm  Access Type: Arteriovenous fistula  Site Condition No complications  Fistula / Graft Assessment Present;Thrill;Bruit  Status Deaccessed  Drainage Description None

## 2018-12-01 NOTE — Progress Notes (Signed)
Notified MD of pt with sweats, and some agitation. Has history of Alcohol Abuse. Asked about last drink, orders placed. Will continue to monitor and assess.

## 2018-12-01 NOTE — Progress Notes (Signed)
[facility], [place] 12/01/18  Subjective:   Patient feels well today. 3 L of fluid was removed with hemodialysis yesterday Hemoglobin found to be low at 6.2.  Patient is scheduled for blood transfusion today Able to eat without nausea or vomiting.  Ate a good breakfast and also asking for extra snacks today.  Objective:  Vital signs in last 24 hours:  Temp:  [97.8 F (36.6 C)-98.7 F (37.1 C)] 98.6 F (37 C) (03/11 0843) Pulse Rate:  [99-113] 99 (03/11 0843) Resp:  [16-31] 18 (03/11 0843) BP: (133-165)/(81-95) 152/85 (03/11 0843) SpO2:  [92 %-96 %] 96 % (03/11 0843) Weight:  [67.2 kg] 67.2 kg (03/11 0438)  Weight change: -0.544 kg Filed Weights   11/30/18 1141 11/30/18 1343 12/01/18 0438  Weight: 69.3 kg 69.3 kg 67.2 kg    Intake/Output:    Intake/Output Summary (Last 24 hours) at 12/01/2018 1503 Last data filed at 12/01/2018 1029 Gross per 24 hour  Intake 170.81 ml  Output 3034 ml  Net -2863.19 ml     Physical Exam: General:  No acute distress, laying in the bed  HEENT  moist oral mucous membranes  Neck  supple  Pulm/lungs  clear to auscultation, normal effort  CVS/Heart  regular  Abdomen:   Soft, nontender  Extremities:  No peripheral edema  Neurologic:  Alert, oriented  Skin:  No acute rashes  Access:  Left upper extremity AV fistula       Basic Metabolic Panel:  Recent Labs  Lab 11/30/18 0807 12/01/18 0544  NA 139 141  K 5.4* 5.0  CL 99 98  CO2 22 31  GLUCOSE 100* 127*  BUN 74* 48*  CREATININE 13.63* 8.36*  CALCIUM 9.7 8.8*  MG  --  2.2  PHOS  --  5.2*     CBC: Recent Labs  Lab 11/30/18 0807 12/01/18 0544  WBC 8.7 7.2  NEUTROABS 7.1  --   HGB 7.0* 6.2*  HCT 22.1* 19.8*  MCV 112.2* 112.5*  PLT 150 139*      Lab Results  Component Value Date   HEPBSAG Negative 08/08/2016   HEPBSAB Reactive 08/08/2016      Microbiology:  Recent Results (from the past 240 hour(s))  MRSA PCR Screening      Status: None   Collection Time: 12/01/18 12:55 PM  Result Value Ref Range Status   MRSA by PCR NEGATIVE NEGATIVE Final    Comment:        The GeneXpert MRSA Assay (FDA approved for NASAL specimens only), is one component of a comprehensive MRSA colonization surveillance program. It is not intended to diagnose MRSA infection nor to guide or monitor treatment for MRSA infections. Performed at [facility], [address] [place], [place] [address]     Coagulation Studies: No results for input(s): LABPROT, INR in the last 72 hours.  Urinalysis: No results for input(s): COLORURINE, LABSPEC, PHURINE, GLUCOSEU, HGBUR, BILIRUBINUR, KETONESUR, PROTEINUR, UROBILINOGEN, NITRITE, LEUKOCYTESUR in the last 72 hours.  Invalid input(s): APPERANCEUR    Imaging: Dg Chest Portable 1 View  Result Date: 11/30/2018 CLINICAL DATA:  Shortness of breath EXAM: PORTABLE CHEST 1 VIEW COMPARISON:  11/15/2018 FINDINGS: Cardiac shadow is prominent. Aortic calcifications are again seen. Diffuse vascular congestion is noted without interstitial edema likely related to volume overload. This has increased in the interval from the prior exam. No bony abnormality is seen. IMPRESSION: Increasing vascular congestion when compared with the prior exam. Electronically Signed   By: Elta Guadeloupe  Lukens M.D.   On: 11/30/2018 07:17     Medications:   . famotidine (PEPCID) IV 20 mg (11/30/18 2116)   . sodium chloride   Intravenous Once  . amLODipine  10 mg Oral Daily  . Chlorhexidine Gluconate Cloth  6 each Topical Q0600  . doxycycline  100 mg Oral BID  . folic acid  1 mg Oral Daily  . LORazepam  0-4 mg Intravenous Q6H   Followed by  . [START ON 12/03/2018] LORazepam  0-4 mg Intravenous Q12H  . methylPREDNISolone (SOLU-MEDROL) injection  40 mg Intravenous Q12H  . multivitamin with minerals  1 tablet Oral Daily  . nicotine  14 mg Transdermal Daily  . sevelamer carbonate  1,600 mg Oral TID WC  . thiamine  100  mg Oral Daily   Or  . thiamine  100 mg Intravenous Daily   acetaminophen, albuterol, diphenhydrAMINE, hydrOXYzine, LORazepam **OR** LORazepam  Assessment/ Plan:  53 y.o. African-American male with ESRD, hypertension, was admitted on 11/30/2018 with excessive itching thought to be secondary to either allergic reaction or uremia for under dialysis  [address]/MWF  1.  Uremia in the setting of ESRD.   -Plan for hemodialysis today to get the patient back on his regular regimen  2.  Anemia of chronic kidney disease -Epogen supplementation with dialysis -Blood transfusion planned with dialysis today -Patient denies any active bleeding  3.  Secondary hyperparathyroidism -Phosphorus 5.2, acceptable   LOS: 1 Laurice Iglesia 3/11/20203:03 PM  [place], [place]  Note: This note was prepared with Dragon dictation. Any transcription errors are unintentional

## 2018-12-01 NOTE — Progress Notes (Signed)
HD Pre Tx Assessment   12/01/18 1700  Neurological  Level of Consciousness Alert  Orientation Level Oriented X4  Respiratory  Respiratory Pattern Regular  Chest Assessment Chest expansion symmetrical  Bilateral Breath Sounds Diminished;Expiratory wheezes;Fine crackles  Cough Productive  Sputum Amount Copious  Cardiac  Pulse Regular  Cardiac Rhythm ST  Vascular  R Radial Pulse +2  L Radial Pulse +2  Integumentary  Integumentary (WDL) X  Skin Color Appropriate for ethnicity  Skin Condition Dry;Itching  Itching intervention  (see MAR - Nephrologist notified)  Musculoskeletal  Musculoskeletal (WDL) WDL  GU Assessment  Genitourinary (WDL) X  Genitourinary Symptoms  (HD pt)  Psychosocial  Psychosocial (WDL) WDL

## 2018-12-01 NOTE — Progress Notes (Signed)
[facility] [place] NAME: Helmut Hennon    MR#:  [id number]  DATE OF BIRTH:  [birth year]  SUBJECTIVE:  CHIEF COMPLAINT:   Chief Complaint  Patient presents with  . Allergic Reaction   No new complaint this morning.  No itching.  No rash.  No bleeding.  Resting comfortably. REVIEW OF SYSTEMS:  Review of Systems  Constitutional: Negative for fever.  HENT: Negative for hearing loss and tinnitus.   Eyes: Negative for blurred vision and double vision.  Respiratory: Negative for cough and shortness of breath.   Cardiovascular: Negative for chest pain and palpitations.  Gastrointestinal: Negative for heartburn.  Genitourinary: Negative for dysuria, frequency and urgency.  Musculoskeletal: Negative for myalgias and neck pain.  Skin: Negative for itching and rash.  Neurological: Negative for dizziness and headaches.  Psychiatric/Behavioral: Negative for depression and hallucinations.    DRUG ALLERGIES:  No Known Allergies VITALS:  Blood pressure (!) 152/85, pulse 99, temperature 98.6 F (37 C), temperature source Oral, resp. rate 18, height 5\' 4"  (1.626 m), weight 67.2 kg, SpO2 96 %. PHYSICAL EXAMINATION:   Physical Exam  Constitutional: He appears well-developed and well-nourished.  HENT:  Head: Normocephalic and atraumatic.  Right Ear: External ear normal.  Eyes: Pupils are equal, round, and reactive to light. Conjunctivae and EOM are normal. Right eye exhibits no discharge.  Neck: Normal range of motion. Neck supple. No tracheal deviation present.  Cardiovascular: Normal rate, regular rhythm and normal heart sounds.  No murmur heard. Respiratory: Effort normal. He has wheezes.  GI: Soft. He exhibits no distension.  Musculoskeletal:        General: No edema.     Comments: Size of right thigh hematoma reduced.  Less tenderness.  Less swelling.  Neurological: He is alert. No cranial nerve deficit.  Skin: Skin is warm. He is not  diaphoretic. No erythema.  Psychiatric: He has a normal mood and affect. His behavior is normal.   LABORATORY PANEL:  Male CBC Recent Labs  Lab 12/01/18 0544  WBC 7.2  HGB 6.2*  HCT 19.8*  PLT 139*   ------------------------------------------------------------------------------------------------------------------ Chemistries  Recent Labs  Lab 12/01/18 0544  NA 141  K 5.0  CL 98  CO2 31  GLUCOSE 127*  BUN 48*  CREATININE 8.36*  CALCIUM 8.8*  MG 2.2  AST 22  ALT 20  ALKPHOS 42  BILITOT 1.2   RADIOLOGY:  No results found. ASSESSMENT AND PLAN:   1.  Allergic reaction Etiology not clear.  Patient cannot remember any exposure. Was already given a dose of intramuscular epinephrine. Placed on IV Solu-Medrol.  Significant improvement clinically.  No itching or rash this morning.  Decreased dose of IV Solu-Medrol from 60 to 40 mg every 12 hourly. Pepcid and as needed Benadryl. Clinically no evidence of any airway compromise. Monitor clinically  2.  Intramuscular hematoma in the right thigh Less swelling and tenderness this morning. Secondary to epinephrine injection.  Avoid blood thinners.  Monitor clinically  3.  End-stage renal disease on hemodialysis Last hemodialysis 4 days prior to admission. Had hemodialysis yesterday.  Scheduled for hemodialysis again today.  4.  Mild hyperkalemia with potassium of 5.4 Resolved with hemodialysis  5.   Acute on chronic chronic disease Likely also related to right thigh hematoma status post epinephrine injection.  Clinically not evidence of any bleeding. Noted hemoglobin down to 6.2.  To transfuse with 1 unit of packed red blood cells during hemodialysis today. Continue  Aranesp.  6.  History of alcohol abuse. No evidence of intoxication or withdrawal at this time. Continue thiamine, folic acid and multivitamin  DVT prophylaxis; SCDs No heparin products due to intramuscular hematoma.   All the records are reviewed  and case discussed with Care Management/Social Worker. Management plans discussed with the patient, family and they are in agreement.  CODE STATUS: Full Code  TOTAL TIME TAKING CARE OF THIS PATIENT: 27 minutes.   More than 50% of the time was spent in counseling/coordination of care: YES  POSSIBLE D/C IN 1-2 DAYS, DEPENDING ON CLINICAL CONDITION.   Tavish Gettis M.D on 12/01/2018 at 2:14 PM  Between 7am to 6pm - Pager - [phone]  After 6pm go to [email or web address] - Proofreader  [facility]  Office  [phone]  CC: Primary care physician; Lorelee Market, MD  Note: This dictation was prepared with Dragon dictation along with smaller phrase technology. Any transcriptional errors that result from this process are unintentional.

## 2018-12-01 NOTE — Progress Notes (Signed)
Pre HD Tx    12/01/18 1705  Hand-Off documentation  Report given to (Full Name) Trellis Paganini RN  Report received from (Full Name) Stacie Glaze RN  Vital Signs  Temp 98.2 F (36.8 C)  Temp Source Oral  Pulse Rate (!) 103  Pulse Rate Source Monitor  Resp 19  BP (!) 154/97  BP Location Right Arm  BP Method Automatic  Patient Position (if appropriate) Lying  Oxygen Therapy  SpO2 100 %  O2 Device Room Air  Pain Assessment  Pain Scale 0-10  Pain Score 0  Dialysis Weight  Weight 67.2 kg  Type of Weight Pre-Dialysis  Time-Out for Hemodialysis  What Procedure? Hemodialysis  Pt Identifiers(min of two) First/Last Name;MRN/Account#  Correct Site? Yes  Correct Side? Yes  Correct Procedure? Yes  Consents Verified? Yes  Rad Studies Available? N/A  Safety Precautions Reviewed? Yes  Engineer, civil (consulting) Number 4  Station Number 4  UF/Alarm Test Passed  Conductivity: Meter 14  Conductivity: Machine  14  pH 7.4  Reverse Osmosis main  Normal Saline Lot Number [id number]  Dialyzer Lot Number [id number]  Disposable Set Lot Number [id number]  Machine Temperature 98.6 F (37 C)  Musician and Audible Yes  Blood Lines Intact and Secured Yes  Pre Treatment Patient Checks  Vascular access used during treatment Fistula  Hepatitis B Surface Antigen Results Negative  Date Hepatitis B Surface Antigen Drawn 11/25/18  Hepatitis B Surface Antibody  (>100)  Date Hepatitis B Surface Antibody Drawn 11/25/18  Hemodialysis Consent Verified Yes  Hemodialysis Standing Orders Initiated Yes  ECG (Telemetry) Monitor On Yes  Prime Ordered Normal Saline  Length of  DialysisTreatment -hour(s) 3.5 Hour(s)  Dialysis Treatment Comments Na 140  Dialyzer Elisio 17H NR  Dialysate 2K, 2.5 Ca  Dialysate Flow Ordered 800  Blood Flow Rate Ordered 400 mL/min  Ultrafiltration Goal 2.5 Liters  Blood Products Ordered Packed Red Blood Cells  Dialysis Blood Pressure Support Ordered Normal Saline   Education / Care Plan  Dialysis Education Provided Yes  Documented Education in Care Plan Yes  Fistula / Graft Left Upper arm Arteriovenous fistula  No Placement Date or Time found.   Orientation: Left  Access Location: Upper arm  Access Type: Arteriovenous fistula  Site Condition No complications  Fistula / Graft Assessment Present;Thrill;Bruit  Status Accessed  Needle Size 15  Drainage Description None

## 2018-12-02 ENCOUNTER — Encounter
Admission: EM | Disposition: A | Discharge status: Home / Self Care | Payer: Self-pay | Source: Home / Self Care | Attending: Internal Medicine

## 2018-12-02 ENCOUNTER — Other Ambulatory Visit (INDEPENDENT_AMBULATORY_CARE_PROVIDER_SITE_OTHER): Payer: Self-pay | Admitting: Vascular Surgery

## 2018-12-02 DIAGNOSIS — S7011XA Contusion of right thigh, initial encounter: Secondary | ICD-10-CM

## 2018-12-02 DIAGNOSIS — Z992 Dependence on renal dialysis: Secondary | ICD-10-CM

## 2018-12-02 DIAGNOSIS — Z79899 Other long term (current) drug therapy: Secondary | ICD-10-CM

## 2018-12-02 DIAGNOSIS — F17210 Nicotine dependence, cigarettes, uncomplicated: Secondary | ICD-10-CM

## 2018-12-02 DIAGNOSIS — N186 End stage renal disease: Secondary | ICD-10-CM

## 2018-12-02 DIAGNOSIS — I120 Hypertensive chronic kidney disease with stage 5 chronic kidney disease or end stage renal disease: Secondary | ICD-10-CM

## 2018-12-02 HISTORY — PX: LOWER EXTREMITY INTERVENTION: CATH118252

## 2018-12-02 LAB — BASIC METABOLIC PANEL
Anion gap: 11 (ref 5–15)
BUN: 32 mg/dL — ABNORMAL HIGH (ref 6–20)
CO2: 28 mmol/L (ref 22–32)
Calcium: 8.6 mg/dL — ABNORMAL LOW (ref 8.9–10.3)
Chloride: 102 mmol/L (ref 98–111)
Creatinine, Ser: 5.51 mg/dL — ABNORMAL HIGH (ref 0.61–1.24)
GFR calc Af Amer: 13 mL/min — ABNORMAL LOW (ref >60)
GFR calc non Af Amer: 11 mL/min — ABNORMAL LOW (ref >60)
Glucose, Bld: 146 mg/dL — ABNORMAL HIGH (ref 70–99)
Potassium: 4 mmol/L (ref 3.5–5.1)
Sodium: 141 mmol/L (ref 135–145)

## 2018-12-02 LAB — CBC
HCT: 24.2 % — ABNORMAL LOW (ref 39.0–52.0)
Hemoglobin: 7.5 g/dL — ABNORMAL LOW (ref 13.0–17.0)
MCH: 34.2 pg — ABNORMAL HIGH (ref 26.0–34.0)
MCHC: 31 g/dL (ref 30.0–36.0)
MCV: 110.5 fL — ABNORMAL HIGH (ref 80.0–100.0)
Platelets: 168 10*3/uL (ref 150–400)
RBC: 2.19 MIL/uL — ABNORMAL LOW (ref 4.22–5.81)
RDW: 19.1 % — ABNORMAL HIGH (ref 11.5–15.5)
WBC: 10.4 10*3/uL (ref 4.0–10.5)
nRBC: 0.5 % — ABNORMAL HIGH (ref 0.0–0.2)

## 2018-12-02 LAB — HEMOGLOBIN AND HEMATOCRIT, BLOOD
HCT: 22.5 % — ABNORMAL LOW (ref 39.0–52.0)
Hemoglobin: 7.1 g/dL — ABNORMAL LOW (ref 13.0–17.0)

## 2018-12-02 LAB — MAGNESIUM: Magnesium: 2.1 mg/dL (ref 1.7–2.4)

## 2018-12-02 SURGERY — LOWER EXTREMITY INTERVENTION
Anesthesia: Moderate Sedation | Laterality: Right

## 2018-12-02 MED ORDER — FENTANYL CITRATE (PF) 100 MCG/2ML IJ SOLN
INTRAMUSCULAR | Status: AC
  Filled 2018-12-02: qty 2

## 2018-12-02 MED ORDER — MIDAZOLAM HCL 5 MG/5ML IJ SOLN
INTRAMUSCULAR | Status: AC
  Filled 2018-12-02: qty 5

## 2018-12-02 MED ORDER — MIDAZOLAM HCL 2 MG/ML PO SYRP
8.0000 mg | ORAL_SOLUTION | Freq: Once | ORAL | Status: DC | PRN
  Filled 2018-12-02: qty 4

## 2018-12-02 MED ORDER — HYDROMORPHONE HCL 1 MG/ML IJ SOLN: 1.0000 mg | Freq: Once | INTRAMUSCULAR | Status: DC | PRN

## 2018-12-02 MED ORDER — FAMOTIDINE 20 MG PO TABS: 20.0000 mg | ORAL_TABLET | Freq: Every day | ORAL | 0 refills | Status: DC

## 2018-12-02 MED ORDER — ONDANSETRON HCL 4 MG/2ML IJ SOLN: 4.0000 mg | Freq: Four times a day (QID) | INTRAMUSCULAR | Status: DC | PRN

## 2018-12-02 MED ORDER — NICOTINE 14 MG/24HR TD PT24: 14.0000 mg | MEDICATED_PATCH | Freq: Every day | TRANSDERMAL | 0 refills | Status: DC

## 2018-12-02 MED ORDER — SODIUM CHLORIDE 0.9 % IV SOLN
INTRAVENOUS | Status: DC
  Administered 2018-12-02: 14:00:00 via INTRAVENOUS

## 2018-12-02 MED ORDER — METHYLPREDNISOLONE SODIUM SUCC 125 MG IJ SOLR: 125.0000 mg | Freq: Once | INTRAMUSCULAR | Status: DC | PRN

## 2018-12-02 MED ORDER — HEPARIN SODIUM (PORCINE) 1000 UNIT/ML IJ SOLN
INTRAMUSCULAR | Status: AC
  Filled 2018-12-02: qty 1

## 2018-12-02 MED ORDER — CEFAZOLIN SODIUM-DEXTROSE 1-4 GM/50ML-% IV SOLN
1.0000 g | INTRAVENOUS | Status: AC
  Filled 2018-12-02: qty 50

## 2018-12-02 MED ORDER — DIPHENHYDRAMINE HCL 50 MG/ML IJ SOLN: 50.0000 mg | Freq: Once | INTRAMUSCULAR | Status: DC | PRN

## 2018-12-02 MED ORDER — MORPHINE SULFATE (PF) 2 MG/ML IV SOLN
2.0000 mg | INTRAVENOUS | Status: DC | PRN
  Administered 2018-12-02: 2 mg via INTRAVENOUS
  Filled 2018-12-02: qty 1

## 2018-12-02 MED ORDER — LIDOCAINE-EPINEPHRINE (PF) 1 %-1:200000 IJ SOLN
INTRAMUSCULAR | Status: AC
  Filled 2018-12-02: qty 30

## 2018-12-02 MED ORDER — FAMOTIDINE 20 MG PO TABS: 40.0000 mg | ORAL_TABLET | Freq: Once | ORAL | Status: DC | PRN

## 2018-12-02 MED ORDER — FENTANYL CITRATE (PF) 100 MCG/2ML IJ SOLN
INTRAMUSCULAR | Status: DC | PRN
  Administered 2018-12-02: 50 ug via INTRAVENOUS

## 2018-12-02 MED ORDER — PREDNISONE 10 MG PO TABS: ORAL_TABLET | ORAL | 0 refills | Status: DC

## 2018-12-02 MED ORDER — MIDAZOLAM HCL 2 MG/2ML IJ SOLN
INTRAMUSCULAR | Status: DC | PRN
  Administered 2018-12-02: 2 mg via INTRAVENOUS

## 2018-12-02 SURGICAL SUPPLY — 10 items
CATH BEACON 5 .035 65 KMP TIP (CATHETERS) ×1 IMPLANT
CATH PIG 70CM (CATHETERS) ×1 IMPLANT
DEVICE STARCLOSE SE CLOSURE (Vascular Products) ×1 IMPLANT
DEVICE TORQUE .025-.038 (MISCELLANEOUS) ×1 IMPLANT
GUIDEWIRE ANGLED .035 180CM (WIRE) ×1 IMPLANT
PACK ANGIOGRAPHY (CUSTOM PROCEDURE TRAY) ×1 IMPLANT
SHEATH BRITE TIP 5FRX11 (SHEATH) ×1 IMPLANT
SYR MEDRAD MARK 7 150ML (SYRINGE) ×1 IMPLANT
TUBING CONTRAST HIGH PRESS 72 (TUBING) ×1 IMPLANT
WIRE J 3MM .035X145CM (WIRE) ×1 IMPLANT

## 2018-12-02 NOTE — H&P (Signed)
[facility] History & Physical Update  The patient was interviewed and re-examined.  The patient's previous History and Physical has been reviewed and is unchanged.  There is no change in the plan of care. We plan to proceed with the scheduled procedure.  Leotis Pain, MD  12/02/2018, 2:19 PM

## 2018-12-02 NOTE — Progress Notes (Signed)
[facility], [place] 12/02/18  Subjective:   Patient complains of right thigh pain and swelling Hemoglobin 7.5 this morning  Objective:  Vital signs in last 24 hours:  Temp:  [98.2 F (36.8 C)-98.6 F (37 C)] 98.3 F (36.8 C) (03/12 0340) Pulse Rate:  [102-110] 102 (03/12 0340) Resp:  [17-24] 23 (03/11 2100) BP: (140-182)/(80-107) 140/80 (03/12 0340) SpO2:  [95 %-100 %] 98 % (03/12 0340) Weight:  [66.9 kg-67.2 kg] 66.9 kg (03/11 2145)  Weight change: -2.11 kg Filed Weights   12/01/18 0438 12/01/18 1705 12/01/18 2145  Weight: 67.2 kg 67.2 kg 66.9 kg    Intake/Output:    Intake/Output Summary (Last 24 hours) at 12/02/2018 1207 Last data filed at 12/02/2018 0603 Gross per 24 hour  Intake 640 ml  Output 2500 ml  Net -1860 ml     Physical Exam: General:  No acute distress, laying in the bed  HEENT  moist oral mucous membranes  Neck  supple  Pulm/lungs  clear to auscultation, normal effort  CVS/Heart  regular  Abdomen:   Soft, nontender  Extremities:  No peripheral edema, right thigh pain and swelling  Neurologic:  Alert, oriented  Skin:  No acute rashes  Access:  Left upper extremity AV fistula       Basic Metabolic Panel:  Recent Labs  Lab 11/30/18 0807 12/01/18 0544 12/02/18 0450  NA 139 141 141  K 5.4* 5.0 4.0  CL 99 98 102  CO2 22 31 28   GLUCOSE 100* 127* 146*  BUN 74* 48* 32*  CREATININE 13.63* 8.36* 5.51*  CALCIUM 9.7 8.8* 8.6*  MG  --  2.2 2.1  PHOS  --  5.2*  --      CBC: Recent Labs  Lab 11/30/18 0807 12/01/18 0544 12/01/18 1930 12/02/18 0450  WBC 8.7 7.2  --  10.4  NEUTROABS 7.1  --   --   --   HGB 7.0* 6.2* 6.3* 7.5*  HCT 22.1* 19.8* 20.1* 24.2*  MCV 112.2* 112.5*  --  110.5*  PLT 150 139*  --  168      Lab Results  Component Value Date   HEPBSAG Negative 08/08/2016   HEPBSAB Reactive 08/08/2016      Microbiology:  Recent Results (from the past 240 hour(s))  MRSA PCR Screening      Status: None   Collection Time: 12/01/18 12:55 PM  Result Value Ref Range Status   MRSA by PCR NEGATIVE NEGATIVE Final    Comment:        The GeneXpert MRSA Assay (FDA approved for NASAL specimens only), is one component of a comprehensive MRSA colonization surveillance program. It is not intended to diagnose MRSA infection nor to guide or monitor treatment for MRSA infections. Performed at [facility], [address] [place], [place] [address]     Coagulation Studies: No results for input(s): LABPROT, INR in the last 72 hours.  Urinalysis: No results for input(s): COLORURINE, LABSPEC, PHURINE, GLUCOSEU, HGBUR, BILIRUBINUR, KETONESUR, PROTEINUR, UROBILINOGEN, NITRITE, LEUKOCYTESUR in the last 72 hours.  Invalid input(s): APPERANCEUR    Imaging: No results found.   Medications:   . famotidine (PEPCID) IV 20 mg (12/02/18 0925)   . sodium chloride   Intravenous Once  . amLODipine  10 mg Oral Daily  . Chlorhexidine Gluconate Cloth  6 each Topical Q0600  . doxycycline  100 mg Oral BID  . [START ON 12/03/2018] epoetin (EPOGEN/PROCRIT) injection  4,000 Units Intravenous Q M,W,F-HD  .  folic acid  1 mg Oral Daily  . LORazepam  0-4 mg Intravenous Q6H   Followed by  . [START ON 12/03/2018] LORazepam  0-4 mg Intravenous Q12H  . methylPREDNISolone (SOLU-MEDROL) injection  40 mg Intravenous Q12H  . multivitamin with minerals  1 tablet Oral Daily  . nicotine  14 mg Transdermal Daily  . sevelamer carbonate  1,600 mg Oral TID WC  . thiamine  100 mg Oral Daily   Or  . thiamine  100 mg Intravenous Daily   acetaminophen, albuterol, diphenhydrAMINE, hydrOXYzine, LORazepam **OR** LORazepam  Assessment/ Plan:  53 y.o. African-American male with ESRD, hypertension, was admitted on 11/30/2018 with excessive itching thought to be secondary to either allergic reaction or uremia for under dialysis  [address]/[facility]  1.  ESRD.   -Plan for hemodialysis tomorrow  2.   Anemia of chronic kidney disease -Epogen supplementation with dialysis -Blood transfusion given with hemodialysis this admission  3.  Secondary hyperparathyroidism -Phosphorus 5.2, acceptable  4.  Right thigh hematoma Surgical evaluation is in progress   LOS: 2 Armanii Pressnell 3/12/202012:07 PM  [place], [place]  Note: This note was prepared with Dragon dictation. Any transcription errors are unintentional

## 2018-12-02 NOTE — Consult Note (Signed)
[facility] Consult Note  MRN : [id number]  Jordan [name] is a 53 y.o. (02-15-1966) male who presents with chief complaint of  Chief Complaint  Patient presents with  . Allergic Reaction   History of Present Illness:  The patient is a 53 year old male with a past medical history of hypertension, EtOH abuse, end-stage renal disease on hemodialysis, anemia, tobacco abuse, hepatitis B, encephalopathy who was admitted through the [facility] emergency department after experiencing a allergic reaction to an unknown trigger at dialysis.  The patient endorses a history of being treated with epinephrine to the right thigh as part of his treatment for anaphylaxis..  Since admission, the patient has noticed progressively worsening swelling and discomfort to that area.  The patient notes his pain worsens with activity.  Skin is intact.  He denies any fever, nausea vomiting.  Vascular surgery was consulted by Dr. Stark Jock for worsening right thigh hematoma. Current Facility-Administered Medications  Medication Dose Route Frequency Provider Last Rate Last Dose  . fentaNYL (SUBLIMAZE) 100 MCG/2ML injection           . heparin 1000 UNIT/ML injection           . lidocaine-EPINEPHrine (XYLOCAINE-EPINEPHrine) 1 %-1:200000 (PF) injection           . midazolam (VERSED) 5 MG/5ML injection           . [MAR Hold] 0.9 %  sodium chloride infusion (Manually program via Guardrails IV Fluids)   Intravenous Once Ojie, Jude, MD      . 0.9 %  sodium chloride infusion   Intravenous Continuous Travez Stancil A, PA-C 10 mL/hr at 12/02/18 1340    . [MAR Hold] acetaminophen (TYLENOL) tablet 650 mg  650 mg Oral Q6H PRN Stark Jock, Jude, MD   650 mg at 12/02/18 1129  . [MAR Hold] albuterol (PROVENTIL) (2.5 MG/3ML) 0.083% nebulizer solution 2.5 mg  2.5 mg Nebulization Q4H PRN Ojie, Jude, MD      . Doug Sou Hold] amLODipine (NORVASC) tablet 10 mg  10 mg Oral Daily Ojie, Jude, MD       . Doug Sou Hold] ceFAZolin (ANCEF) IVPB 1 g/50 mL premix  1 g Intravenous On Call Krizia Flight, Janalyn Harder, PA-C      . [MAR Hold] Chlorhexidine Gluconate Cloth 2 % PADS 6 each  6 each Topical Q0600 Murlean Iba, MD      . Doug Sou Hold] diphenhydrAMINE (BENADRYL) injection 25 mg  25 mg Intravenous Q6H PRN Stark Jock, Jude, MD   25 mg at 12/02/18 0118  . diphenhydrAMINE (BENADRYL) injection 50 mg  50 mg Intravenous Once PRN Shanee Batch, Janalyn Harder, PA-C      . [MAR Hold] doxycycline (VIBRA-TABS) tablet 100 mg  100 mg Oral BID Stark Jock, Jude, MD   100 mg at 12/02/18 0925  . [MAR Hold] epoetin alfa (EPOGEN,PROCRIT) injection 4,000 Units  4,000 Units Intravenous Q M,W,F-HD Murlean Iba, MD   4,000 Units at 12/01/18 1753  . [MAR Hold] famotidine (PEPCID) IVPB 20 mg premix  20 mg Intravenous Q12H Ojie, Jude, MD 100 mL/hr at 12/02/18 0925 20 mg at 12/02/18 0925  . famotidine (PEPCID) tablet 40 mg  40 mg Oral Once PRN Mamoru Takeshita, Janalyn Harder, PA-C      . [MAR Hold] folic acid (FOLVITE) tablet 1 mg  1 mg Oral Daily Lance Coon, MD   1 mg at 12/02/18 0925  . [MAR Hold] HYDROmorphone (DILAUDID) injection 1 mg  1 mg Intravenous Once PRN Jem Castro, Joelene Millin  A, PA-C      . [MAR Hold] hydrOXYzine (ATARAX/VISTARIL) tablet 50 mg  50 mg Oral TID PRN Stark Jock, Jude, MD   50 mg at 12/02/18 0200  . lidocaine-EPINEPHrine (XYLOCAINE-EPINEPHrine) 1 %-1:200000 (PF) injection           . [MAR Hold] LORazepam (ATIVAN) injection 0-4 mg  0-4 mg Intravenous Q6H Lance Coon, MD   2 mg at 12/02/18 0230   Followed by  . [MAR Hold] LORazepam (ATIVAN) injection 0-4 mg  0-4 mg Intravenous Q12H Lance Coon, MD      . Doug Sou Hold] LORazepam (ATIVAN) tablet 1 mg  1 mg Oral Q6H PRN Lance Coon, MD       Or  . Doug Sou Hold] LORazepam (ATIVAN) injection 1 mg  1 mg Intravenous Q6H PRN Lance Coon, MD      . methylPREDNISolone sodium succinate (SOLU-MEDROL) 125 mg/2 mL injection 125 mg  125 mg Intravenous Once PRN Degan Hanser, Janalyn Harder, PA-C      . [MAR  Hold] methylPREDNISolone sodium succinate (SOLU-MEDROL) 40 mg/mL injection 40 mg  40 mg Intravenous Q12H Ojie, Jude, MD   40 mg at 12/02/18 0925  . midazolam (VERSED) 2 MG/ML syrup 8 mg  8 mg Oral Once PRN Porfiria Heinrich, Janalyn Harder, PA-C      . [MAR Hold] morphine 2 MG/ML injection 2 mg  2 mg Intravenous Q4H PRN Stark Jock, Jude, MD   2 mg at 12/02/18 1249  . [MAR Hold] multivitamin with minerals tablet 1 tablet  1 tablet Oral Daily Lance Coon, MD   1 tablet at 12/02/18 [phone]  . [MAR Hold] nicotine (NICODERM CQ - dosed in mg/24 hours) patch 14 mg  14 mg Transdermal Daily Ojie, Jude, MD   14 mg at 12/02/18 0925  . [MAR Hold] ondansetron (ZOFRAN) injection 4 mg  4 mg Intravenous Q6H PRN Eyal Greenhaw, Janalyn Harder, PA-C      . [MAR Hold] sevelamer carbonate (RENVELA) tablet 1,600 mg  1,600 mg Oral TID WC Ojie, Jude, MD   1,600 mg at 12/02/18 0925  . [MAR Hold] thiamine (VITAMIN B-1) tablet 100 mg  100 mg Oral Daily Lance Coon, MD   100 mg at 12/02/18 4287   Or  . [MAR Hold] thiamine (B-1) injection 100 mg  100 mg Intravenous Daily Lance Coon, MD       Past Medical History:  Diagnosis Date  . Anemia   . CKD (chronic kidney disease)    Stage 5  Dialysis - M/W/F  . ETOH abuse   . Hypertension    Past Surgical History:  Procedure Laterality Date  . BASCILIC VEIN TRANSPOSITION Left 08/07/2016   Procedure: LEFT BASILIC VEIN TRANSPOSITION;  Surgeon: Angelia Mould, MD;  Location: [place];  Service: Vascular;  Laterality: Left;  . COLONOSCOPY N/A 08/12/2016   Procedure: COLONOSCOPY;  Surgeon: Otis Brace, MD;  Location: [place];  Service: Gastroenterology;  Laterality: N/A;  . ESOPHAGOGASTRODUODENOSCOPY N/A 08/12/2016   Procedure: ESOPHAGOGASTRODUODENOSCOPY (EGD);  Surgeon: Otis Brace, MD;  Location: [place];  Service: Gastroenterology;  Laterality: N/A;  . INSERTION OF DIALYSIS CATHETER N/A 08/07/2016   Procedure: INSERTION OF TUNNELED DIALYSIS CATHETER;  Surgeon: Angelia Mould, MD;  Location: [place];  Service: Vascular;  Laterality: N/A;  . LIGATION OF ARTERIOVENOUS  FISTULA Left 09/12/2016   Procedure: BANDING OF LEFT  ARTERIOVENOUS  FISTULA;  Surgeon: Angelia Mould, MD;  Location: [place];  Service: Vascular;  Laterality: Left;  . NO PAST SURGERIES  Social History Social History   Tobacco Use  . Smoking status: Current Every Day Smoker    Packs/day: 0.05    Years: 15.00    Pack years: 0.75    Types: Cigarettes  . Smokeless tobacco: Never Used  . Tobacco comment: down to 1 a day  Substance Use Topics  . Alcohol use: Yes    Alcohol/week: 21.0 standard drinks    Types: 21 Cans of beer per week    Comment: none since 08/04/16  . Drug use: No   Family History Family History  Problem Relation Age of Onset  . Diabetes Mother   . Kidney failure Mother   . Kidney failure Brother   . Post-traumatic stress disorder Neg Hx   . Bladder Cancer Neg Hx   . Kidney cancer Neg Hx   Denies family history of peripheral artery disease, venous disease or bleeding/clotting disorders.  No Known Allergies  REVIEW OF SYSTEMS (Negative unless checked)  Constitutional: [] Weight loss  [] Fever  [] Chills Cardiac: [] Chest pain   [] Chest pressure   [] Palpitations   [] Shortness of breath when laying flat   [] Shortness of breath at rest   [] Shortness of breath with exertion. Vascular:  [x] Pain in legs with walking   [x] Pain in legs at rest   [x] Pain in legs when laying flat   [] Claudication   [] Pain in feet when walking  [] Pain in feet at rest  [] Pain in feet when laying flat   [] History of DVT   [] Phlebitis   [x] Swelling in legs   [] Varicose veins   [] Non-healing ulcers Pulmonary:   [] Uses home oxygen   [] Productive cough   [] Hemoptysis   [] Wheeze  [] COPD   [] Asthma Neurologic:  [] Dizziness  [] Blackouts   [] Seizures   [] History of stroke   [] History of TIA  [] Aphasia   [] Temporary blindness   [] Dysphagia   [] Weakness or numbness in arms   [] Weakness or numbness in  legs Musculoskeletal:  [] Arthritis   [] Joint swelling   [] Joint pain   [] Low back pain Hematologic:  [] Easy bruising  [] Easy bleeding   [] Hypercoagulable state   [x] Anemic  [x] Hepatitis Gastrointestinal:  [] Blood in stool   [] Vomiting blood  [x] Gastroesophageal reflux/heartburn   [] Difficulty swallowing. Genitourinary:  [x] Chronic kidney disease   [] Difficult urination  [] Frequent urination  [] Burning with urination   [] Blood in urine Skin:  [] Rashes   [] Ulcers   [] Wounds Psychological:  [] History of anxiety   []  History of major depression.  Physical Examination  Vitals:   12/01/18 2145 12/02/18 0340 12/02/18 1341 12/02/18 1601  BP: (!) 154/90 140/80 (!) 154/83   Pulse: (!) 106 (!) 102    Resp:   19   Temp: 98.3 F (36.8 C) 98.3 F (36.8 C) 98.3 F (36.8 C)   TempSrc: Oral Oral Oral   SpO2: 96% 98% 94% 100%  Weight: 66.9 kg     Height:       Body mass index is 25.3 kg/m. Gen:  WD/WN, NAD Head: [place]/AT, No temporalis wasting. Prominent temp pulse not noted. Ear/Nose/Throat: Hearing grossly intact, nares w/o erythema or drainage, oropharynx w/o Erythema/Exudate Eyes: Sclera non-icteric, conjunctiva clear Neck: Trachea midline.  No JVD.  Pulmonary:  Good air movement, respirations not labored, equal bilaterally.  Cardiac: RRR, normal S1, S2. Vascular:  Vessel Right Left  Radial Palpable Palpable  Ulnar Palpable Palpable  Brachial Palpable Palpable  Carotid Palpable, without bruit Palpable, without bruit  Aorta Not palpable N/A  Femoral Palpable Palpable  Popliteal Palpable  Palpable  PT Palpable Palpable  DP Palpable Palpable   Right lower extremity: Hematoma noted to the right thigh.  Tender to palpation.  Erythematous.  Skin is intact.  Gastrointestinal: soft, non-tender/non-distended. No guarding/reflex.  Musculoskeletal: M/S 5/5 throughout.  Extremities without ischemic changes.  No deformity or atrophy.  Neurologic: Sensation grossly intact in extremities.   Symmetrical.  Speech is fluent. Motor exam as listed above. Psychiatric: Judgment intact, Mood & affect appropriate for pt's clinical situation. Dermatologic: No rashes or ulcers noted.  No cellulitis or open wounds. Lymph : No Cervical, Axillary, or Inguinal lymphadenopathy.  CBC Lab Results  Component Value Date   WBC 10.4 12/02/2018   HGB 7.1 (L) 12/02/2018   HCT 22.5 (L) 12/02/2018   MCV 110.5 (H) 12/02/2018   PLT 168 12/02/2018   BMET    Component Value Date/Time   NA 141 12/02/2018 0450   K 4.0 12/02/2018 0450   CL 102 12/02/2018 0450   CO2 28 12/02/2018 0450   GLUCOSE 146 (H) 12/02/2018 0450   BUN 32 (H) 12/02/2018 0450   CREATININE 5.51 (H) 12/02/2018 0450   CALCIUM 8.6 (L) 12/02/2018 0450   GFRNONAA 11 (L) 12/02/2018 0450   GFRAA 13 (L) 12/02/2018 0450   Estimated Creatinine Clearance: 13.3 mL/min (A) (by C-G formula based on SCr of 5.51 mg/dL (H)).  COAG No results found for: INR, PROTIME  Radiology Ct Angio Chest Pe W And/or Wo Contrast  Result Date: 11/15/2018 CLINICAL DATA:  Chronic kidney disease. Alcohol abuse. Hypertension. Current shortness of breath with bloody sputum 4 days. EXAM: CT ANGIOGRAPHY CHEST WITH CONTRAST TECHNIQUE: Multidetector CT imaging of the chest was performed using the standard protocol during bolus administration of intravenous contrast. Multiplanar CT image reconstructions and MIPs were obtained to evaluate the vascular anatomy. CONTRAST:  50mL OMNIPAQUE IOHEXOL 350 MG/ML SOLN COMPARISON:  Chest x-ray 10/03/2018 FINDINGS: Cardiovascular: Mild cardiomegaly. Calcified plaque over the left main and 3 vessel coronary arteries. Mild calcified plaque over the thoracic aorta. No evidence of pulmonary emboli. Mediastinum/Nodes: No mediastinal or hilar adenopathy. Remaining mediastinal structures are normal. Lungs/Pleura: Lungs are adequately inflated and demonstrate mild bilateral hazy central perihilar increased attenuation of bibasilar basilar  increased attenuation suggesting interstitial edema. Minimal fluid along the superior aspect of the right major fissure. Airways are normal. Upper Abdomen: No acute findings. Calcified plaque over the abdominal aorta. Musculoskeletal: Mild degenerative change of the spine. Review of the MIP images confirms the above findings. IMPRESSION: Subtle hazy bilateral perihilar and bibasilar attenuation likely interstitial edema. Mild cardiomegaly. Tiny amount right pleural fluid. No evidence of pulmonary emboli. Aortic Atherosclerosis (ICD10-I70.0). Atherosclerotic coronary artery disease. Electronically Signed   By: Marin Olp M.D.   On: 11/15/2018 16:00   Dg Chest Portable 1 View  Result Date: 11/30/2018 CLINICAL DATA:  Shortness of breath EXAM: PORTABLE CHEST 1 VIEW COMPARISON:  11/15/2018 FINDINGS: Cardiac shadow is prominent. Aortic calcifications are again seen. Diffuse vascular congestion is noted without interstitial edema likely related to volume overload. This has increased in the interval from the prior exam. No bony abnormality is seen. IMPRESSION: Increasing vascular congestion when compared with the prior exam. Electronically Signed   By: Inez Catalina M.D.   On: 11/30/2018 07:17   Assessment/Plan The patient is a 53 year old male with multiple medical issues who was admitted to [facility] for anaphylaxis.  Patient was given epinephrine to the right thigh and subsequently experienced progressively worsening hematoma 1.  Right thigh hematoma: Size and tenderness  of the hematoma has progressively worsened since admission.  Recommend a right lower extremity angiogram with possible intervention to assess the patient's anatomy and localize any bleeding.  If bleeding is noted and attempt at hemostasis can be achieved at that time.  Procedure, risks and benefits explained to the patient.  All questions answered.  Patient wishes to proceed. 2.  Tobacco abuse: We had a discussion for  approximately three minutes regarding the absolute need for smoking cessation due to the deleterious nature of tobacco on the vascular system. We discussed the tobacco use would diminish patency of any intervention, and likely significantly worsen progressio of disease. We discussed multiple agents for quitting including replacement therapy or medications to reduce cravings such as Chantix. The patient voices their understanding of the importance of smoking cessation. 3. ESRD: Denies any issues with his hemodialysis access at this time 4.  Hypertension: On appropriate medications. Encouraged good control as its slows the progression of atherosclerotic disease  Discussed with Dr. Mayme Genta, PA-C  12/02/2018 4:03 PM    This note was created with Dragon medical transcription system.  Any error is purely unintentional

## 2018-12-02 NOTE — Progress Notes (Addendum)
Jordan [name] NAME: Jordan [name]    MR#:  [id number]  DATE OF BIRTH:  1966/07/27  SUBJECTIVE:  CHIEF COMPLAINT:   Chief Complaint  Patient presents with  . Allergic Reaction   No new complaint this morning.  No itching.  No rash.  No bleeding.  Resting comfortably. REVIEW OF SYSTEMS:  Review of Systems  Constitutional: Negative for fever.  HENT: Negative for hearing loss and tinnitus.   Eyes: Negative for blurred vision and double vision.  Respiratory: Negative for cough and shortness of breath.   Cardiovascular: Negative for chest pain and palpitations.  Gastrointestinal: Negative for heartburn.  Genitourinary: Negative for dysuria, frequency and urgency.  Musculoskeletal: Negative for myalgias and neck pain.       Increasing swelling and pain at the site of her right thigh hematoma  Skin: Negative for itching and rash.  Neurological: Negative for dizziness and headaches.  Psychiatric/Behavioral: Negative for depression and hallucinations.    DRUG ALLERGIES:  No Known Allergies VITALS:  Blood pressure 140/80, pulse (!) 102, temperature 98.3 F (36.8 C), temperature source Oral, resp. rate (!) 23, height 5\' 4"  (1.626 m), weight 66.9 kg, SpO2 98 %. PHYSICAL EXAMINATION:   Physical Exam  Constitutional: He appears well-developed and well-nourished.  HENT:  Head: Normocephalic and atraumatic.  Right Ear: External ear normal.  Eyes: Pupils are equal, round, and reactive to light. Conjunctivae and EOM are normal. Right eye exhibits no discharge.  Neck: Normal range of motion. Neck supple. No tracheal deviation present.  Cardiovascular: Normal rate, regular rhythm and normal heart sounds.  No murmur heard. Respiratory: Effort normal. He has wheezes.  GI: Soft. He exhibits no distension.  Musculoskeletal:        General: No edema.     Comments: Increased swelling and tenderness at the site of the right thigh hematoma  this morning  Neurological: He is alert. No cranial nerve deficit.  Skin: Skin is warm. He is not diaphoretic. No erythema.  Psychiatric: He has a normal mood and affect. His behavior is normal.   LABORATORY PANEL:  Male CBC Recent Labs  Lab 12/02/18 0450  WBC 10.4  HGB 7.5*  HCT 24.2*  PLT 168   ------------------------------------------------------------------------------------------------------------------ Chemistries  Recent Labs  Lab 12/01/18 0544 12/02/18 0450  NA 141 141  K 5.0 4.0  CL 98 102  CO2 31 28  GLUCOSE 127* 146*  BUN 48* 32*  CREATININE 8.36* 5.51*  CALCIUM 8.8* 8.6*  MG 2.2 2.1  AST 22  --   ALT 20  --   ALKPHOS 42  --   BILITOT 1.2  --    RADIOLOGY:  No results found. ASSESSMENT AND PLAN:   1.  Allergic reaction Etiology not clear.  Patient cannot remember any exposure. Was already given a dose of intramuscular epinephrine. Placed on IV Solu-Medrol.  Significant improvement clinically.  Had some itching last night which appears improved this morning.   Patient already on pepcid and as needed Benadryl. Clinically no evidence of any airway compromise. Monitor clinically  2.  Intramuscular hematoma in the right thigh Secondary to epinephrine injection on the day of presentation prior to my evaluation The hematoma did improve in the last 1 to 2 days. This morning however patient noted to have increased swelling and tenderness of the right thigh. Requested for repeat hemoglobin and hematocrit. Vascular surgery consult placed .  I spoke with Dr. Delana Meyer.  He recommended keeping patient  n.p.o.  He will discussed with Dr. Lucky Cowboy who will evaluate patient and make a decision regarding doing arteriogram to determine if patient actively bleeding. PRN morphine for pain control. Avoid blood thinners.  Monitor clinically  3.  End-stage renal disease on hemodialysis Last hemodialysis 4 days prior to admission. Continue inpatient hemodialysis per  nephrology  4.  Mild hyperkalemia with potassium of 5.4 Resolved with hemodialysis  5.   Acute on chronic chronic disease Likely also related to right thigh hematoma status post epinephrine injection. Noted hemoglobin down to 6.2 yesterday and patient transfused with 1 unit of packed red blood cells with improvement in hemoglobin to 7.5 this morning.Continue Aranesp.  6.  History of alcohol abuse. No evidence of intoxication or withdrawal at this time. Continue thiamine, folic acid and multivitamin  DVT prophylaxis; SCDs No heparin products due to intramuscular hematoma.   All the records are reviewed and case discussed with Care Management/Social Worker. Management plans discussed with the patient, family and they are in agreement.  CODE STATUS: Full Code  TOTAL TIME TAKING CARE OF THIS PATIENT: 36 minutes.   More than 50% of the time was spent in counseling/coordination of care: YES  POSSIBLE D/C IN 2 DAYS, DEPENDING ON CLINICAL CONDITION.   [name] M.D on 12/02/2018 at 12:09 PM  Between 7am to 6pm - Pager - [phone]  After 6pm go to [email or web address] - Proofreader  [facility]  Office  [phone]  CC: Primary care physician; Lorelee Market, MD  Note: This dictation was prepared with Dragon dictation along with smaller phrase technology. Any transcriptional errors that result from this process are unintentional.

## 2018-12-02 NOTE — Op Note (Signed)
[facility]  Percutaneous Study/Intervention Procedural Note   Date of Surgery: 11/30/2018 - 12/02/2018  Surgeon(s):Lezley Bedgood    Assistants:none  Pre-operative Diagnosis: Right thigh hematoma  Post-operative diagnosis:  Same  Procedure(s) Performed:             1.  Ultrasound guidance for vascular access left femoral artery             2.  Catheter placement into right SFA and profunda femoris artery from left femoral approach             3.  Aortogram and selective right lower extremity angiogram including selective injections in both the SFA and the profunda femoris artery             4.  StarClose closure device left femoral artery  EBL: 5 cc  Contrast: 50 cc  Fluoro Time: 4.8 minutes  Moderate Conscious Sedation Time: approximately 20 minutes using 2 mg of Versed and 50 mcg of Fentanyl              Indications:  Patient is a 53 y.o.male with a right thigh hematoma in the area from previous epinephrine shot injection.  This got worse with activity and has progressed despite being 2 days since the injection.  There was concern for possible arterial component to the hematoma, and we were asked to perform an angiogram for further evaluation.  Risks and benefits are discussed and informed consent is obtained.   Procedure:  The patient was identified and appropriate procedural time out was performed.  The patient was then placed supine on the table and prepped and draped in the usual sterile fashion. Moderate conscious sedation was administered during a face to face encounter with the patient throughout the procedure with my supervision of the RN administering medicines and monitoring the patient's vital signs, pulse oximetry, telemetry and mental status throughout from the start of the procedure until the patient was taken to the recovery room. Ultrasound was used to evaluate the left common femoral artery.  It was patent .  A digital ultrasound image was acquired.   A Seldinger needle was used to access the left common femoral artery under direct ultrasound guidance and a permanent image was performed.  A 0.035 J wire was advanced without resistance and a 5Fr sheath was placed.  Pigtail catheter was placed into the aorta and an AP aortogram was performed. This demonstrated sluggish renal artery flow but no obvious stenosis and normal aorta and iliac segments without significant stenosis. I then crossed the aortic bifurcation and advanced to the right femoral head. Selective right lower extremity angiogram was then performed. This demonstrated a normal common femoral artery, profunda femoris artery, and superficial femoral artery proximally.  No obvious signs of bleeding were seen on the indirect injection.  I then decided to sequentially catheterize the SFA and perform further interrogation of the SFA.  This was normal with normal flow down to the tibial trifurcation and no extravasation from any branches.  Catheter was then pulled back to the common femoral artery and then advanced down into the profunda femoris artery for selective injection of the profunda femoris artery to evaluate for bleeding.  Again, no extravasation was seen in any profunda femoris artery branches with brisk normal flow.  I elected to terminate the procedure. The sheath was removed and StarClose closure device was deployed in the left femoral artery with excellent hemostatic result. The patient was taken to the recovery room in stable  condition having tolerated the procedure well.  Findings:               Aortogram:  Normal aorta and iliac arteries without significant stenosis.  The renal artery perfusion was quite pruned but no obvious renal artery stenosis was seen.             Right lower Extremity:  Normal common femoral artery, profunda femoris artery, and superficial femoral artery down to the popliteal artery and the tibial trifurcation.  Sequential catheter placement was performed into the  SFA and then the profunda femoris artery to perform selective imaging to look at branches for extravasation.  No extravasation was seen in either the SFA or the profunda femoris artery branches.   Disposition: Patient was taken to the recovery room in stable condition having tolerated the procedure well.  Complications: None  Leotis Pain 12/02/2018 4:32 PM   This note was created with Dragon Medical transcription system. Any errors in dictation are purely unintentional.

## 2018-12-02 NOTE — Progress Notes (Signed)
Patient clinically stable post angiogram, vitals stable. No bleeding nor hematoma at left groin. Report called to  Care nurse on telemetry (Amy) with questions answered.denies complaints at this time.

## 2018-12-02 NOTE — Progress Notes (Signed)
Patient back from vascular procedure.  Had some bleeding at site.  Held pressure for 10 minutes and bleeding appears to have ceased.  Will continue to monitor.

## 2018-12-03 ENCOUNTER — Encounter: Payer: Self-pay | Admitting: Vascular Surgery

## 2018-12-03 LAB — MAGNESIUM: Magnesium: 2.2 mg/dL (ref 1.7–2.4)

## 2018-12-03 LAB — CBC
HCT: 21.5 % — ABNORMAL LOW (ref 39.0–52.0)
Hemoglobin: 6.6 g/dL — ABNORMAL LOW (ref 13.0–17.0)
MCH: 34.2 pg — ABNORMAL HIGH (ref 26.0–34.0)
MCHC: 30.7 g/dL (ref 30.0–36.0)
MCV: 111.4 fL — ABNORMAL HIGH (ref 80.0–100.0)
Platelets: 182 10*3/uL (ref 150–400)
RBC: 1.93 MIL/uL — ABNORMAL LOW (ref 4.22–5.81)
RDW: 18.6 % — ABNORMAL HIGH (ref 11.5–15.5)
WBC: 11.2 10*3/uL — ABNORMAL HIGH (ref 4.0–10.5)
nRBC: 0.2 % (ref 0.0–0.2)

## 2018-12-03 LAB — BASIC METABOLIC PANEL
Anion gap: 10 (ref 5–15)
BUN: 61 mg/dL — ABNORMAL HIGH (ref 6–20)
CO2: 27 mmol/L (ref 22–32)
Calcium: 8.5 mg/dL — ABNORMAL LOW (ref 8.9–10.3)
Chloride: 104 mmol/L (ref 98–111)
Creatinine, Ser: 8.05 mg/dL — ABNORMAL HIGH (ref 0.61–1.24)
GFR calc Af Amer: 8 mL/min — ABNORMAL LOW (ref >60)
GFR calc non Af Amer: 7 mL/min — ABNORMAL LOW (ref >60)
Glucose, Bld: 154 mg/dL — ABNORMAL HIGH (ref 70–99)
Potassium: 4.7 mmol/L (ref 3.5–5.1)
Sodium: 141 mmol/L (ref 135–145)

## 2018-12-03 LAB — PHOSPHORUS: Phosphorus: 4.2 mg/dL (ref 2.5–4.6)

## 2018-12-03 LAB — PREPARE RBC (CROSSMATCH)

## 2018-12-03 LAB — HEMOGLOBIN AND HEMATOCRIT, BLOOD
HCT: 25.4 % — ABNORMAL LOW (ref 39.0–52.0)
Hemoglobin: 8.2 g/dL — ABNORMAL LOW (ref 13.0–17.0)

## 2018-12-03 MED ORDER — HYDRALAZINE HCL 20 MG/ML IJ SOLN
10.0000 mg | Freq: Once | INTRAMUSCULAR | Status: AC
  Administered 2018-12-03: 10 mg via INTRAVENOUS
  Filled 2018-12-03: qty 1

## 2018-12-03 MED ORDER — SODIUM CHLORIDE 0.9% IV SOLUTION: Freq: Once | INTRAVENOUS | Status: DC

## 2018-12-03 MED ORDER — OXYCODONE-ACETAMINOPHEN 5-325 MG PO TABS: 1.0000 | ORAL_TABLET | ORAL | 0 refills | Status: AC | PRN

## 2018-12-03 NOTE — Progress Notes (Signed)
Pt refused his CIWA assessment and refusing bed alarm off. Will continue to monitor.

## 2018-12-03 NOTE — Discharge Summary (Signed)
[facility] NAME: Jordan [name]    MR#:  [id number]  DATE OF BIRTH:  1966-07-16  DATE OF ADMISSION:  11/30/2018   ADMITTING PHYSICIAN: Gladstone Lighter, MD  DATE OF DISCHARGE: 12/03/2018  PRIMARY CARE PHYSICIAN: Lorelee Market, MD   ADMISSION DIAGNOSIS:  Uremia [N19] End-stage renal disease on hemodialysis ([facility]) [N18.6, Z99.2] Anaphylaxis, initial encounter [T78.2XXA] Hematoma of right thigh, initial encounter [S70.11XA] DISCHARGE DIAGNOSIS:  Active Problems:   Allergic reaction  SECONDARY DIAGNOSIS:   Past Medical History:  Diagnosis Date  . Anemia   . CKD (chronic kidney disease)    Stage 5  Dialysis - M/W/F  . ETOH abuse   . Hypertension    HOSPITAL COURSE:    Chief complaint; allergic reaction  History of presenting complaint; Jordan [name]  is a 53 y.o. male with a known history of end-stage renal disease on hemodialysis on Mondays, Wednesdays and Fridays, hypertension, alcohol abuse and anemia of chronic disease who presented to the emergency room via EMS with complaints of facial swelling and itching.  Was felt to be having an allergic reaction.  Etiology unclear.  Was given DuoNeb in route.  Patient denies any new exposures.  No dizziness.  No chest pain.  Symptoms started gradually.  No aggravating or relieving factors.  Patient was already given intramuscular epinephrine to the right thigh.  Laboratory studies done in the emergency room.  There was concern that patient might be uremic.  Last hemodialysis was 4 days prior to admission.  Patient already seen by nephrology service prior to my evaluation.  Patient also reported to have developed hematoma in the right thigh at the site of epinephrine administration.  Patient already given a dose of IV Solu-Medrol as well as Pepcid and Benadryl.  Patient admitted to medical service for further evaluation   Hospital course;  1.Allergic reaction Etiology not  clear. Patient cannot remember any exposure. Was already given a dose of intramuscular epinephrine.  Patient was treated with IV Solu-Medrol during this admission.  Being discharged on p.o. prednisone taper.  PRN Benadryl and Pepcid.  No further itching reported today.  Clinically no evidence of any airway compromise.  Hemodynamically stable.   2.Intramuscular hematoma in the right thigh Secondary to epinephrineinjection on the day of presentation prior to my evaluation The hematoma did improve in the last 1 to 2 days.  Yesterday patient noted to have increased swelling and tenderness of the right thigh.  Vascular surgery was consulted.  Patient subsequently had angiogram done with normal common femoral artery, profunda femoris artery, and superficial femoral artery down to the popliteal artery and the tibial trifurcation.  Sequential catheter placement was performed into the SFA and then the profunda femoris artery to perform selective imaging to look at branches for extravasation.  No extravasation was seen in either the SFA or the profunda femoris artery branches. Patient reports significant improvement this morning.  Swelling significantly improved.  Patient ambulating independently.  Patient requesting to be discharged home today. Noted slight drop in hemoglobin to 6.6 this morning.  Was transfused with 1 unit of packed red blood cells during hemodialysis today and repeat hemoglobin improved to 8.2 prior to discharge.  3.End-stage renal disease on hemodialysis Continue outpatient hemodialysis on discharge  4.Mild hyperkalemia with potassium of 5.4 Resolved with hemodialysis  5.  Acute on chronic chronic disease Likely also related to right thigh hematoma status post epinephrine injection.  Patient received a total of 2  units of packed red blood cells during this admission.  Hemoglobin stable at 8.2 this morning from 6.2 yesterday  6.History of alcohol abuse. No evidence of  intoxication or withdrawal at this time.  Was placed on p.o. thiamine, multivitamin and folic acid during this admission.    DISCHARGE CONDITIONS:  Stable CONSULTS OBTAINED:  Treatment Team:  Jordan Iba, MD DRUG ALLERGIES:  No Known Allergies DISCHARGE MEDICATIONS:   Allergies as of 12/03/2018   No Known Allergies     Medication List    STOP taking these medications   gabapentin 100 MG capsule Commonly known as:  NEURONTIN     TAKE these medications   acetaminophen 325 MG tablet Commonly known as:  TYLENOL Take 2 tablets (650 mg total) by mouth every 6 (six) hours as needed for mild pain (or Fever >/= 101). What changed:    when to take this  reasons to take this   albuterol 108 (90 Base) MCG/ACT inhaler Commonly known as:  PROVENTIL HFA;VENTOLIN HFA Inhale 2 puffs into the lungs every 4 (four) hours as needed for wheezing or shortness of breath.   amLODipine 10 MG tablet Commonly known as:  NORVASC Take 1 tablet (10 mg total) by mouth daily.   Darbepoetin Alfa 60 MCG/0.3ML Sosy injection Commonly known as:  ARANESP Inject 0.3 mLs (60 mcg total) into the vein every Monday with hemodialysis.   diphenhydrAMINE 25 MG tablet Commonly known as:  BENADRYL Take 25 mg by mouth every 6 (six) hours as needed for itching.   doxycycline 100 MG capsule Commonly known as:  VIBRAMYCIN Take 1 capsule (100 mg total) by mouth 2 (two) times daily.   famotidine 20 MG tablet Commonly known as:  PEPCID Take 1 tablet (20 mg total) by mouth daily for 5 days.   folic acid 1 MG tablet Commonly known as:  FOLVITE Take 1 tablet (1 mg total) by mouth daily.   hydrOXYzine 25 MG tablet Commonly known as:  ATARAX/VISTARIL Take 2 tablets (50 mg total) by mouth every 8 (eight) hours as needed. What changed:  Another medication with the same name was removed. Continue taking this medication, and follow the directions you see here.   loperamide 2 MG capsule Commonly known as:   IMODIUM Take 4 mg by mouth as needed for diarrhea or loose stools.   nicotine 14 mg/24hr patch Commonly known as:  NICODERM CQ - dosed in mg/24 hours Place 1 patch (14 mg total) onto the skin daily.   oxyCODONE-acetaminophen 5-325 MG tablet Commonly known as:  Percocet Take 1 tablet by mouth every 4 (four) hours as needed for up to 4 days for severe pain.   predniSONE 10 MG tablet Commonly known as:  DELTASONE 40 mg p.o. daily x2 days Then 30 mg p.o. daily x2 days Then 20 mg p.o. daily x2 days Then 10 mg p.o. daily x2 days What changed:    medication strength  how much to take  how to take this  when to take this  additional instructions   Renvela 800 MG tablet Generic drug:  sevelamer carbonate Take 2,400 mg by mouth 3 (three) times daily with meals.   thiamine 100 MG tablet Take 1 tablet (100 mg total) by mouth daily.        DISCHARGE INSTRUCTIONS:   DIET:  Renal diet DISCHARGE CONDITION:  Stable ACTIVITY:  Activity as tolerated OXYGEN:  Home Oxygen: No.  Oxygen Delivery: room air DISCHARGE LOCATION:  home   If you experience  worsening of your admission symptoms, develop shortness of breath, life threatening emergency, suicidal or homicidal thoughts you must seek medical attention immediately by calling 911 or calling your MD immediately  if symptoms less severe.  You Must read complete instructions/literature along with all the possible adverse reactions/side effects for all the Medicines you take and that have been prescribed to you. Take any new Medicines after you have completely understood and accpet all the possible adverse reactions/side effects.   Please note  You were cared for by a hospitalist during your hospital stay. If you have any questions about your discharge medications or the care you received while you were in the hospital after you are discharged, you can call the unit and asked to speak with the hospitalist on call if the hospitalist  that took care of you is not available. Once you are discharged, your primary care physician will handle any further medical issues. Please note that NO REFILLS for any discharge medications will be authorized once you are discharged, as it is imperative that you return to your primary care physician (or establish a relationship with a primary care physician if you do not have one) for your aftercare needs so that they can reassess your need for medications and monitor your lab values.    On the day of Discharge:  VITAL SIGNS:  Blood pressure (!) 164/95, pulse (!) 106, temperature 98.5 F (36.9 C), temperature source Oral, resp. rate 18, height 5\' 4"  (1.626 m), weight 70.4 kg, SpO2 99 %. PHYSICAL EXAMINATION:  GENERAL:  53 y.o.-year-old patient lying in the bed with no acute distress.  EYES: Pupils equal, round, reactive to light and accommodation. No scleral icterus. Extraocular muscles intact.  HEENT: Head atraumatic, normocephalic. Oropharynx and nasopharynx clear.  NECK:  Supple, no jugular venous distention. No thyroid enlargement, no tenderness.  LUNGS: Normal breath sounds bilaterally, no wheezing, rales,rhonchi or crepitation. No use of accessory muscles of respiration.  CARDIOVASCULAR: S1, S2 normal. No murmurs, rubs, or gallops.  ABDOMEN: Soft, non-tender, non-distended. Bowel sounds present. No organomegaly or mass.  EXTREMITIES: No pedal edema, cyanosis, or clubbing.  NEUROLOGIC: Cranial nerves II through XII are intact. Muscle strength 5/5 in all extremities. Sensation intact. Gait not checked.  PSYCHIATRIC: The patient is alert and oriented x 3.  SKIN: No obvious rash, lesion, or ulcer.  DATA REVIEW:   CBC Recent Labs  Lab 12/03/18 0436 12/03/18 1529  WBC 11.2*  --   HGB 6.6* 8.2*  HCT 21.5* 25.4*  PLT 182  --     Chemistries  Recent Labs  Lab 12/01/18 0544  12/03/18 0436  NA 141   < > 141  K 5.0   < > 4.7  CL 98   < > 104  CO2 31   < > 27  GLUCOSE 127*   < >  154*  BUN 48*   < > 61*  CREATININE 8.36*   < > 8.05*  CALCIUM 8.8*   < > 8.5*  MG 2.2   < > 2.2  AST 22  --   --   ALT 20  --   --   ALKPHOS 42  --   --   BILITOT 1.2  --   --    < > = values in this interval not displayed.     Microbiology Results  Results for orders placed or performed during the hospital encounter of 11/30/18  MRSA PCR Screening     Status: None  Collection Time: 12/01/18 12:55 PM  Result Value Ref Range Status   MRSA by PCR NEGATIVE NEGATIVE Final    Comment:        The GeneXpert MRSA Assay (FDA approved for NASAL specimens only), is one component of a comprehensive MRSA colonization surveillance program. It is not intended to diagnose MRSA infection nor to guide or monitor treatment for MRSA infections. Performed at [facility], [address] [place], [place] [address]     RADIOLOGY:  No results found.   Management plans discussed with the patient, family and they are in agreement.  CODE STATUS: Full Code   TOTAL TIME TAKING CARE OF THIS PATIENT: 40 minutes.    Jordan [name] M.D on 12/03/2018 at 4:08 PM  Between 7am to 6pm - Pager - [phone]  After 6pm go to [email or web address] - Proofreader  [facility]  Office  [phone]  CC: Primary care physician; Lorelee Market, MD   Note: This dictation was prepared with Dragon dictation along with smaller phrase technology. Any transcriptional errors that result from this process are unintentional.

## 2018-12-03 NOTE — Progress Notes (Signed)
[place], [place] 12/03/18  Subjective:   Patient Seen during hemodialysis.  Tolerating well.  Right thigh is softer and less painful.  Patient continues to complain of generalized itching.  Receiving blood transfusion during dialysis.   HEMODIALYSIS FLOWSHEET:  Blood Flow Rate (mL/min): 300 mL/min Arterial Pressure (mmHg): -150 mmHg Venous Pressure (mmHg): 170 mmHg Transmembrane Pressure (mmHg): 60 mmHg Ultrafiltration Rate (mL/min): 0 mL/min Dialysate Flow Rate (mL/min): 800 ml/min Conductivity: Machine : 13.9 Conductivity: Machine : 13.9 Dialysis Fluid Bolus: Normal Saline Bolus Amount (mL): 250 mL Dialysate Change: 2K    Objective:  Vital signs in last 24 hours:  Temp:  [98.1 F (36.7 C)-98.5 F (36.9 C)] 98.4 F (36.9 C) (03/13 1145) Pulse Rate:  [98-109] 103 (03/13 1200) Resp:  [16-23] 21 (03/13 1200) BP: (144-182)/(83-106) 182/98 (03/13 1200) SpO2:  [94 %-100 %] 97 % (03/13 1200) Weight:  [70.4 kg] 70.4 kg (03/13 0955)  Weight change:  Filed Weights   12/01/18 1705 12/01/18 2145 12/03/18 0955  Weight: 67.2 kg 66.9 kg 70.4 kg    Intake/Output:    Intake/Output Summary (Last 24 hours) at 12/03/2018 1242 Last data filed at 12/03/2018 0905 Gross per 24 hour  Intake 256.32 ml  Output 0 ml  Net 256.32 ml     Physical Exam: General:  No acute distress, laying in the bed  HEENT  moist oral mucous membranes  Neck  supple  Pulm/lungs  clear to auscultation, normal effort  CVS/Heart  regular  Abdomen:   Soft, nontender  Extremities:  No peripheral edema, right thigh pain and swelling- improving  Neurologic:  Alert, oriented  Skin:  No acute rashes  Access:  Left upper extremity AV fistula       Basic Metabolic Panel:  Recent Labs  Lab 11/30/18 0807 12/01/18 0544 12/02/18 0450 12/03/18 0436  NA 139 141 141 141  K 5.4* 5.0 4.0 4.7  CL 99 98 102 104  CO2 22 31 28 27   GLUCOSE 100* 127* 146* 154*  BUN 74* 48* 32* 61*   CREATININE 13.63* 8.36* 5.51* 8.05*  CALCIUM 9.7 8.8* 8.6* 8.5*  MG  --  2.2 2.1 2.2  PHOS  --  5.2*  --  4.2     CBC: Recent Labs  Lab 11/30/18 0807 12/01/18 0544 12/01/18 1930 12/02/18 0450 12/02/18 1253 12/03/18 0436  WBC 8.7 7.2  --  10.4  --  11.2*  NEUTROABS 7.1  --   --   --   --   --   HGB 7.0* 6.2* 6.3* 7.5* 7.1* 6.6*  HCT 22.1* 19.8* 20.1* 24.2* 22.5* 21.5*  MCV 112.2* 112.5*  --  110.5*  --  111.4*  PLT 150 139*  --  168  --  182      Lab Results  Component Value Date   HEPBSAG Negative 08/08/2016   HEPBSAB Reactive 08/08/2016      Microbiology:  Recent Results (from the past 240 hour(s))  MRSA PCR Screening     Status: None   Collection Time: 12/01/18 12:55 PM  Result Value Ref Range Status   MRSA by PCR NEGATIVE NEGATIVE Final    Comment:        The GeneXpert MRSA Assay (FDA approved for NASAL specimens only), is one component of a comprehensive MRSA colonization surveillance program. It is not intended to diagnose MRSA infection nor to guide or monitor treatment for MRSA infections. Performed at [facility], [address], [place], [place] [address]  Coagulation Studies: No results for input(s): LABPROT, INR in the last 72 hours.  Urinalysis: No results for input(s): COLORURINE, LABSPEC, PHURINE, GLUCOSEU, HGBUR, BILIRUBINUR, KETONESUR, PROTEINUR, UROBILINOGEN, NITRITE, LEUKOCYTESUR in the last 72 hours.  Invalid input(s): APPERANCEUR    Imaging: No results found.   Medications:   .  ceFAZolin (ANCEF) IV    . famotidine (PEPCID) IV 20 mg (12/02/18 2104)   . sodium chloride   Intravenous Once  . sodium chloride   Intravenous Once  . amLODipine  10 mg Oral Daily  . Chlorhexidine Gluconate Cloth  6 each Topical Q0600  . doxycycline  100 mg Oral BID  . epoetin (EPOGEN/PROCRIT) injection  4,000 Units Intravenous Q M,W,F-HD  . folic acid  1 mg Oral Daily  . LORazepam  0-4 mg Intravenous Q12H  . methylPREDNISolone  (SOLU-MEDROL) injection  40 mg Intravenous Q12H  . multivitamin with minerals  1 tablet Oral Daily  . nicotine  14 mg Transdermal Daily  . sevelamer carbonate  1,600 mg Oral TID WC  . thiamine  100 mg Oral Daily   Or  . thiamine  100 mg Intravenous Daily   acetaminophen, albuterol, diphenhydrAMINE, HYDROmorphone (DILAUDID) injection, hydrOXYzine, LORazepam **OR** LORazepam, morphine injection, ondansetron (ZOFRAN) IV  Assessment/ Plan:  53 y.o. African-American male with ESRD, hypertension, was admitted on 11/30/2018 with excessive itching thought to be secondary to either allergic reaction or uremia for under dialysis  [address]/MWF  1.  ESRD.   -Patient seen during dialysis Tolerating well   2.  Anemia of chronic kidney disease -Epogen supplementation with dialysis -Blood transfusion given with hemodialysis this admission  3.  Secondary hyperparathyroidism -Phosphorus 4.2, acceptable  4.  Right thigh hematoma Right leg angiogram was negative for any active bleed. Clinically improving   LOS: 3 Jordan [name] 3/13/202012:42 PM  [place], [place]  Note: This note was prepared with Dragon dictation. Any transcription errors are unintentional

## 2018-12-03 NOTE — Progress Notes (Signed)
Post HD Tx   12/03/18 1352  Vital Signs  Pulse Rate (!) 105  Resp (!) 22  BP (!) 160/99  Oxygen Therapy  SpO2 100 %  Post-Hemodialysis Assessment  Rinseback Volume (mL) 250 mL  Dialyzer Clearance Lightly streaked  Duration of HD Treatment -hour(s) 3.5 hour(s)  Hemodialysis Intake (mL) 500 mL  UF Total -Machine (mL) 526 mL  Net UF (mL) 26 mL  Tolerated HD Treatment Yes  Post-Hemodialysis Comments  (Pt tolerated well after UF off/BFR 300 per MD)  AVG/AVF Arterial Site Held (minutes) 10 minutes  AVG/AVF Venous Site Held (minutes) 10 minutes

## 2018-12-03 NOTE — Progress Notes (Signed)
Pt refused his CHG bath. Will continue to monitor.

## 2018-12-03 NOTE — Plan of Care (Signed)
  Problem: Education: Goal: Knowledge of General Education information will improve Description Including pain rating scale, medication(s)/side effects and non-pharmacologic comfort measures Outcome: Progressing   Problem: Clinical Measurements: Goal: Will remain free from infection Outcome: Progressing   Problem: Activity: Goal: Risk for activity intolerance will decrease Outcome: Progressing   Problem: Clinical Measurements: Goal: Complications related to the disease process, condition or treatment will be avoided or minimized Outcome: Progressing

## 2018-12-03 NOTE — Progress Notes (Signed)
  Pt's BP is 168/101, pt received blood pressure  meds after HD tx. Dr. Stark Jock notified, received verbal orders for one time dose of  IV  Hydralazine.   Update 16:46; Rechecked BP is now 148/ 95. Pt in no acute distress.

## 2018-12-03 NOTE — Progress Notes (Signed)
HD Tx End   12/03/18 1345  Hand-Off documentation  Report given to (Full Name) Stacie Glaze RN  Report received from (Full Name) Trellis Paganini RN  Vital Signs  Temp 98.6 F (37 C)  Temp Source Oral  Pulse Rate 99  Pulse Rate Source Monitor  Resp (!) 22  BP (!) 175/78  BP Location Right Arm  BP Method Automatic  Patient Position (if appropriate) Lying  Oxygen Therapy  SpO2 99 %  O2 Device Room Air  Pain Assessment  Pain Scale 0-10  Pain Score 0  During Hemodialysis Assessment  Blood Flow Rate (mL/min) 300 mL/min  Arterial Pressure (mmHg) -150 mmHg  Venous Pressure (mmHg) 170 mmHg  Transmembrane Pressure (mmHg) 60 mmHg  Ultrafiltration Rate (mL/min) 0 mL/min  Dialysate Flow Rate (mL/min) 800 ml/min  Conductivity: Machine  13.9  HD Safety Checks Performed Yes  Dialysis Fluid Bolus Normal Saline  Bolus Amount (mL) 250 mL  Intra-Hemodialysis Comments Tx completed  Fistula / Graft Left Upper arm Arteriovenous fistula  No Placement Date or Time found.   Orientation: Left  Access Location: Upper arm  Access Type: Arteriovenous fistula  Site Condition No complications  Fistula / Graft Assessment Present;Thrill;Bruit  Status Deaccessed  Drainage Description None

## 2018-12-03 NOTE — Progress Notes (Signed)
Jordan [name] to be D/C'd Home per MD order.  Discussed prescriptions and follow up appointments with the patient. Prescriptions given to patient, medication list explained in detail. Pt verbalized understanding.  Allergies as of 12/03/2018   No Known Allergies     Medication List    STOP taking these medications   gabapentin 100 MG capsule Commonly known as:  NEURONTIN     TAKE these medications   acetaminophen 325 MG tablet Commonly known as:  TYLENOL Take 2 tablets (650 mg total) by mouth every 6 (six) hours as needed for mild pain (or Fever >/= 101). What changed:    when to take this  reasons to take this   albuterol 108 (90 Base) MCG/ACT inhaler Commonly known as:  PROVENTIL HFA;VENTOLIN HFA Inhale 2 puffs into the lungs every 4 (four) hours as needed for wheezing or shortness of breath.   amLODipine 10 MG tablet Commonly known as:  NORVASC Take 1 tablet (10 mg total) by mouth daily.   Darbepoetin Alfa 60 MCG/0.3ML Sosy injection Commonly known as:  ARANESP Inject 0.3 mLs (60 mcg total) into the vein every Monday with hemodialysis.   diphenhydrAMINE 25 MG tablet Commonly known as:  BENADRYL Take 25 mg by mouth every 6 (six) hours as needed for itching.   doxycycline 100 MG capsule Commonly known as:  VIBRAMYCIN Take 1 capsule (100 mg total) by mouth 2 (two) times daily.   famotidine 20 MG tablet Commonly known as:  PEPCID Take 1 tablet (20 mg total) by mouth daily for 5 days.   folic acid 1 MG tablet Commonly known as:  FOLVITE Take 1 tablet (1 mg total) by mouth daily.   hydrOXYzine 25 MG tablet Commonly known as:  ATARAX/VISTARIL Take 2 tablets (50 mg total) by mouth every 8 (eight) hours as needed. What changed:  Another medication with the same name was removed. Continue taking this medication, and follow the directions you see here.   loperamide 2 MG capsule Commonly known as:  IMODIUM Take 4 mg by mouth as needed for diarrhea or loose stools.    nicotine 14 mg/24hr patch Commonly known as:  NICODERM CQ - dosed in mg/24 hours Place 1 patch (14 mg total) onto the skin daily.   oxyCODONE-acetaminophen 5-325 MG tablet Commonly known as:  Percocet Take 1 tablet by mouth every 4 (four) hours as needed for up to 4 days for severe pain.   predniSONE 10 MG tablet Commonly known as:  DELTASONE 40 mg p.o. daily x2 days Then 30 mg p.o. daily x2 days Then 20 mg p.o. daily x2 days Then 10 mg p.o. daily x2 days What changed:    medication strength  how much to take  how to take this  when to take this  additional instructions   Renvela 800 MG tablet Generic drug:  sevelamer carbonate Take 2,400 mg by mouth 3 (three) times daily with meals.   thiamine 100 MG tablet Take 1 tablet (100 mg total) by mouth daily.       Vitals:   12/03/18 1442 12/03/18 1620  BP: (!) 164/95 (!) 168/101  Pulse: (!) 106   Resp: 18   Temp: 98.5 F (36.9 C)   SpO2: 99%     Tele box removed and returned. Skin clean, dry and intact without evidence of skin break down, no evidence of skin tears noted. IV catheter discontinued intact. Site without signs and symptoms of complications. Dressing and pressure applied. Pt denies pain at  this time. No complaints noted.  An After Visit Summary was printed and given to the patient. Patient escorted via [place], and D/C home via private auto.  [name]

## 2018-12-03 NOTE — Progress Notes (Signed)
[facility] Daily Progress Note   Subjective: 1 Day Post-Op: 1. Ultrasound guidance for vascular access left femoral artery 2. Catheter placement into right SFA and profunda femoris artery from left femoral  approach 3. Aortogram and selective right lower extremity angiogram including selective injections  in both the SFA and the profunda femoris artery 4.  StarClose closure device left femoral artery  Findings: Right lower Extremity: Normal common femoral artery, profunda femoris artery, and superficial femoral artery down to the popliteal artery and the tibial trifurcation.  Sequential catheter placement was performed into the SFA and then the profunda femoris artery to perform selective imaging to look at branches for extravasation.  No extravasation was seen in either the SFA or the profunda femoris artery branches.  No active bleeding noted on angiogram. No further recommendations or follow up needed.  Vascular to sign off at this time.  Discussed with Dr. Eber Hong Wynton Hufstetler PA-C 12/03/2018 11:00 AM

## 2018-12-03 NOTE — Progress Notes (Signed)
HD Tx Start   12/03/18 1015  Vital Signs  Pulse Rate 100  Resp 18  BP (!) 159/94  Oxygen Therapy  SpO2 97 %  During Hemodialysis Assessment  Blood Flow Rate (mL/min) 400 mL/min  Arterial Pressure (mmHg) -110 mmHg  Venous Pressure (mmHg) 190 mmHg  Transmembrane Pressure (mmHg) 60 mmHg  Ultrafiltration Rate (mL/min) 430 mL/min  Dialysate Flow Rate (mL/min) 800 ml/min  Conductivity: Machine  13.9  HD Safety Checks Performed Yes  Dialysis Fluid Bolus Normal Saline  Bolus Amount (mL) 250 mL  Intra-Hemodialysis Comments Tx initiated

## 2018-12-03 NOTE — Progress Notes (Signed)
Pre HD Tx   12/03/18 0955  Hand-Off documentation  Report given to (Full Name) Trellis Paganini RN  Report received from (Full Name) Stacie Glaze RN  Vital Signs  Temp 98.4 F (36.9 C)  Temp Source Oral  Pulse Rate (!) 102  Pulse Rate Source Monitor  Resp 16  BP (!) 154/96  BP Location Right Arm  BP Method Automatic  Patient Position (if appropriate) Lying  Oxygen Therapy  SpO2 96 %  O2 Device Room Air  Pain Assessment  Pain Scale 0-10  Pain Score 0  Dialysis Weight  Weight 70.4 kg  Type of Weight Pre-Dialysis  Time-Out for Hemodialysis  What Procedure? Hemodialysis   Pt Identifiers(min of two) First/Last Name;MRN/Account#  Correct Site? Yes  Correct Side? Yes  Correct Procedure? Yes  Consents Verified? Yes  Rad Studies Available? N/A  Safety Precautions Reviewed? Yes  Engineer, civil (consulting) Number 4  Station Number 4  UF/Alarm Test Passed  Conductivity: Meter 14  Conductivity: Machine  13.8  pH 7.2  Reverse Osmosis main  Normal Saline Lot Number [id number]  Dialyzer Lot Number [id number]  Disposable Set Lot Number [id number]  Machine Temperature 98.6 F (37 C)  Musician and Audible Yes  Blood Lines Intact and Secured Yes  Pre Treatment Patient Checks  Vascular access used during treatment Fistula  Hepatitis B Surface Antigen Results Negative  Date Hepatitis B Surface Antigen Drawn 11/25/18  Hepatitis B Surface Antibody  (>10)  Date Hepatitis B Surface Antibody Drawn 11/25/18  Hemodialysis Consent Verified Yes  Hemodialysis Standing Orders Initiated Yes  ECG (Telemetry) Monitor On Yes  Prime Ordered Normal Saline  Length of  DialysisTreatment -hour(s) 3.5 Hour(s)  Dialysis Treatment Comments Na 140  Dialyzer Elisio 17H NR  Dialysate 2K, 2.5 Ca  Dialysate Flow Ordered 800  Blood Flow Rate Ordered 400 mL/min  Ultrafiltration Goal 1 Liters  Blood Products Ordered Packed Red Blood Cells  Dialysis Blood Pressure Support Ordered Normal Saline  Education  / Care Plan  Dialysis Education Provided Yes  Documented Education in Care Plan Yes  Fistula / Graft Left Upper arm Arteriovenous fistula  No Placement Date or Time found.   Orientation: Left  Access Location: Upper arm  Access Type: Arteriovenous fistula  Site Condition No complications  Fistula / Graft Assessment Present;Thrill;Bruit  Status Accessed  Needle Size 15  Drainage Description None

## 2018-12-03 NOTE — Care Management Important Message (Signed)
Important Message  Patient Details  Name: Jordan [name] MRN: [id number] Date of Birth: 10-07-1965   Medicare Important Message Given:  Yes    Dannette Barbara 12/03/2018, 2:10 PM

## 2018-12-03 NOTE — Progress Notes (Signed)
Pre HD Assessment   Noted right left hematoma following epi admin after suspected allergic rxn on admission. Pt voices that it is painful to the touch. Left groin dressing post procedure from yesterday is clean, dry and intact without active bleeding. Will attempt 1 liter fluid removal today.     12/03/18 1000  Neurological  Level of Consciousness Alert  Orientation Level Oriented X4  Respiratory  Respiratory Pattern Regular  Chest Assessment Chest expansion symmetrical  Bilateral Breath Sounds Clear;Diminished  Cough Non-productive;Other (Comment) (non-productive today-hemoptysis last tx (small amts/x2))  Cardiac  Pulse Regular  Heart Sounds S1, S2  Jugular Venous Distention (JVD) No  ECG Monitor Yes  Cardiac Rhythm ST  Ectopy Multifocal PVC's  Ectopy Frequency Occasional  Antiarrhythmic device No  Vascular  R Radial Pulse +2  L Radial Pulse +2  Integumentary  Integumentary (WDL) X  Skin Color Appropriate for ethnicity  Skin Condition Dry;Itching  Additional Integumentary Comments  (HD pt)  Musculoskeletal  Musculoskeletal (WDL) WDL  Gastrointestinal  Bowel Sounds Assessment Hyperactive  GU Assessment  Genitourinary (WDL) X  Genitourinary Symptoms Oliguria (HD pt)  Psychosocial  Psychosocial (WDL) WDL  Emotional support given Given to patient

## 2018-12-03 NOTE — Progress Notes (Signed)
Post HD Tx Assessment    12/03/18 1355  Neurological  Level of Consciousness Alert  Orientation Level Oriented X4  Respiratory  Respiratory Pattern Regular  Chest Assessment Chest expansion symmetrical  Bilateral Breath Sounds Clear;Diminished  Cardiac  Pulse Regular  Heart Sounds S1, S2  Jugular Venous Distention (JVD) No  ECG Monitor Yes  Cardiac Rhythm ST  Ectopy Multifocal PVC's  Ectopy Frequency Occasional  Antiarrhythmic device No  Vascular  R Radial Pulse +2  L Radial Pulse +2  Integumentary  Integumentary (WDL) X  Skin Color Appropriate for ethnicity  Skin Condition Dry;Itching  Musculoskeletal  Musculoskeletal (WDL) WDL  Gastrointestinal  Bowel Sounds Assessment Hyperactive  GU Assessment  Genitourinary (WDL) X  Genitourinary Symptoms Oliguria (HD pt)  Psychosocial  Psychosocial (WDL) WDL  Emotional support given Given to patient

## 2018-12-04 LAB — TYPE AND SCREEN
ABO/RH(D): O NEG
Antibody Screen: NEGATIVE
Unit division: 0
Unit division: 0

## 2018-12-04 LAB — BPAM RBC
Blood Product Expiration Date: 202003122359
Blood Product Expiration Date: 202003282359
ISSUE DATE / TIME: 202003111731
ISSUE DATE / TIME: 202003131135
Unit Type and Rh: 5100
Unit Type and Rh: 9500

## 2019-02-11 ENCOUNTER — Emergency Department: Payer: Medicare Other

## 2019-02-11 ENCOUNTER — Inpatient Hospital Stay
Admission: EM | Admit: 2019-02-11 | Discharge: 2019-02-12 | DRG: 189 | Disposition: A | Discharge status: Home / Self Care | Payer: Medicare Other | Attending: Internal Medicine | Admitting: Internal Medicine

## 2019-02-11 ENCOUNTER — Encounter: Payer: Self-pay | Admitting: Emergency Medicine

## 2019-02-11 ENCOUNTER — Other Ambulatory Visit: Payer: Self-pay

## 2019-02-11 DIAGNOSIS — R188 Other ascites: Secondary | ICD-10-CM | POA: Diagnosis present

## 2019-02-11 DIAGNOSIS — R042 Hemoptysis: Secondary | ICD-10-CM | POA: Diagnosis present

## 2019-02-11 DIAGNOSIS — F17210 Nicotine dependence, cigarettes, uncomplicated: Secondary | ICD-10-CM | POA: Diagnosis present

## 2019-02-11 DIAGNOSIS — Z79899 Other long term (current) drug therapy: Secondary | ICD-10-CM

## 2019-02-11 DIAGNOSIS — Z992 Dependence on renal dialysis: Secondary | ICD-10-CM

## 2019-02-11 DIAGNOSIS — N2581 Secondary hyperparathyroidism of renal origin: Secondary | ICD-10-CM | POA: Diagnosis present

## 2019-02-11 DIAGNOSIS — I120 Hypertensive chronic kidney disease with stage 5 chronic kidney disease or end stage renal disease: Secondary | ICD-10-CM | POA: Diagnosis present

## 2019-02-11 DIAGNOSIS — Z833 Family history of diabetes mellitus: Secondary | ICD-10-CM

## 2019-02-11 DIAGNOSIS — J810 Acute pulmonary edema: Principal | ICD-10-CM | POA: Diagnosis present

## 2019-02-11 DIAGNOSIS — D631 Anemia in chronic kidney disease: Secondary | ICD-10-CM | POA: Diagnosis present

## 2019-02-11 DIAGNOSIS — Z1159 Encounter for screening for other viral diseases: Secondary | ICD-10-CM

## 2019-02-11 DIAGNOSIS — N186 End stage renal disease: Secondary | ICD-10-CM | POA: Diagnosis present

## 2019-02-11 DIAGNOSIS — D649 Anemia, unspecified: Secondary | ICD-10-CM

## 2019-02-11 DIAGNOSIS — E875 Hyperkalemia: Secondary | ICD-10-CM | POA: Diagnosis present

## 2019-02-11 DIAGNOSIS — D62 Acute posthemorrhagic anemia: Secondary | ICD-10-CM | POA: Diagnosis present

## 2019-02-11 LAB — COMPREHENSIVE METABOLIC PANEL WITH GFR
ALT: 98 U/L — ABNORMAL HIGH (ref 0–44)
AST: 104 U/L — ABNORMAL HIGH (ref 15–41)
Albumin: 3.5 g/dL (ref 3.5–5.0)
Alkaline Phosphatase: 65 U/L (ref 38–126)
Anion gap: 22 — ABNORMAL HIGH (ref 5–15)
BUN: 98 mg/dL — ABNORMAL HIGH (ref 6–20)
CO2: 22 mmol/L (ref 22–32)
Calcium: 9 mg/dL (ref 8.9–10.3)
Chloride: 96 mmol/L — ABNORMAL LOW (ref 98–111)
Creatinine, Ser: 15.76 mg/dL — ABNORMAL HIGH (ref 0.61–1.24)
GFR calc Af Amer: 4 mL/min — ABNORMAL LOW
GFR calc non Af Amer: 3 mL/min — ABNORMAL LOW
Glucose, Bld: 87 mg/dL (ref 70–99)
Potassium: 6.3 mmol/L (ref 3.5–5.1)
Sodium: 140 mmol/L (ref 135–145)
Total Bilirubin: 1.4 mg/dL — ABNORMAL HIGH (ref 0.3–1.2)
Total Protein: 6.5 g/dL (ref 6.5–8.1)

## 2019-02-11 LAB — PROTIME-INR
INR: 1.4 — ABNORMAL HIGH (ref 0.8–1.2)
Prothrombin Time: 16.7 s — ABNORMAL HIGH (ref 11.4–15.2)

## 2019-02-11 LAB — CBC WITH DIFFERENTIAL/PLATELET
Abs Immature Granulocytes: 0.12 K/uL — ABNORMAL HIGH (ref 0.00–0.07)
Basophils Absolute: 0 K/uL (ref 0.0–0.1)
Basophils Relative: 0 %
Eosinophils Absolute: 0.1 K/uL (ref 0.0–0.5)
Eosinophils Relative: 1 %
HCT: 20.4 % — ABNORMAL LOW (ref 39.0–52.0)
Hemoglobin: 6.4 g/dL — ABNORMAL LOW (ref 13.0–17.0)
Immature Granulocytes: 1 %
Lymphocytes Relative: 10 %
Lymphs Abs: 1.1 K/uL (ref 0.7–4.0)
MCH: 34.2 pg — ABNORMAL HIGH (ref 26.0–34.0)
MCHC: 31.4 g/dL (ref 30.0–36.0)
MCV: 109.1 fL — ABNORMAL HIGH (ref 80.0–100.0)
Monocytes Absolute: 0.7 K/uL (ref 0.1–1.0)
Monocytes Relative: 6 %
Neutro Abs: 8.4 K/uL — ABNORMAL HIGH (ref 1.7–7.7)
Neutrophils Relative %: 82 %
Platelets: 155 K/uL (ref 150–400)
RBC: 1.87 MIL/uL — ABNORMAL LOW (ref 4.22–5.81)
RDW: 15.9 % — ABNORMAL HIGH (ref 11.5–15.5)
WBC: 10.4 K/uL (ref 4.0–10.5)
nRBC: 0.2 % (ref 0.0–0.2)

## 2019-02-11 LAB — MRSA PCR SCREENING: MRSA by PCR: NEGATIVE

## 2019-02-11 LAB — APTT: aPTT: 29 s (ref 24–36)

## 2019-02-11 LAB — TROPONIN I: Troponin I: 0.07 ng/mL (ref <0.03)

## 2019-02-11 LAB — PREPARE RBC (CROSSMATCH)

## 2019-02-11 LAB — SARS CORONAVIRUS 2 BY RT PCR (HOSPITAL ORDER, PERFORMED IN ~~LOC~~ HOSPITAL LAB): SARS Coronavirus 2: NEGATIVE

## 2019-02-11 LAB — LACTIC ACID, PLASMA: Lactic Acid, Venous: 1.7 mmol/L (ref 0.5–1.9)

## 2019-02-11 IMAGING — CT CT CHEST WITHOUT CONTRAST
2 of 3 series · 15 of 36 positions shown, 18 images · non-contrast
Comparison: Chest radiograph [DATE] and CTA [DATE]

CLINICAL DATA: Hemoptysis. Shortness of breath. On dialysis.

EXAM:
CT CHEST WITHOUT CONTRAST
TECHNIQUE: Multidetector CT imaging of the chest was performed following the
standard protocol without IV contrast.

[Series 2: thorax · axial · 0.66mm/px · z∈[-320,-62]mm · 12 of 153 slices shown, 15 images]
[im 12/153  mediastinal]
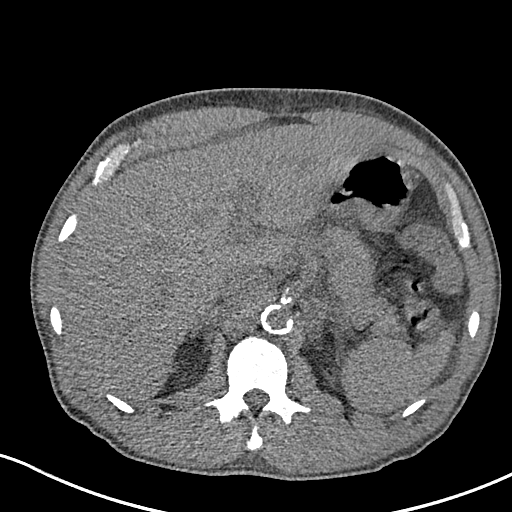
[im 12/153  lung]
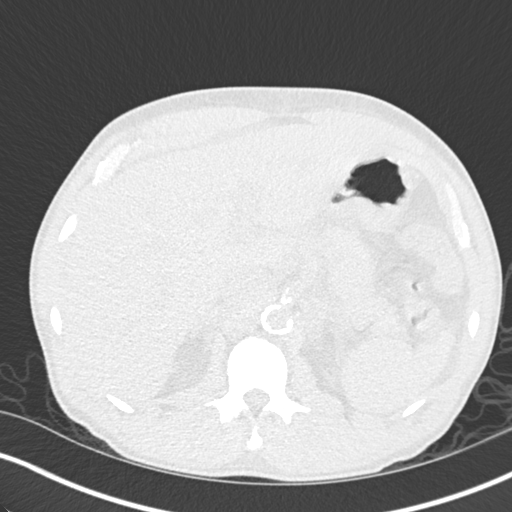
[im 23/153  lung]
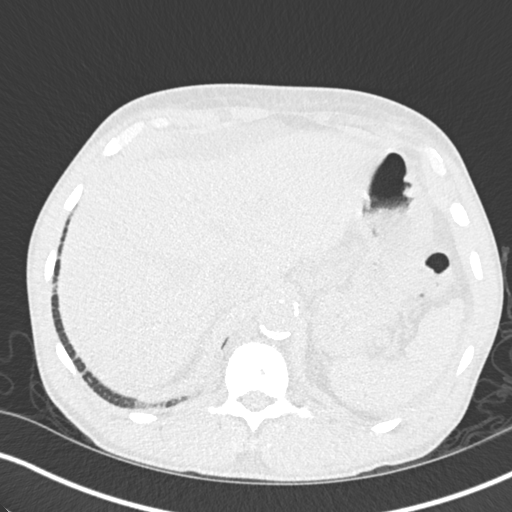
[im 34/153  lung]
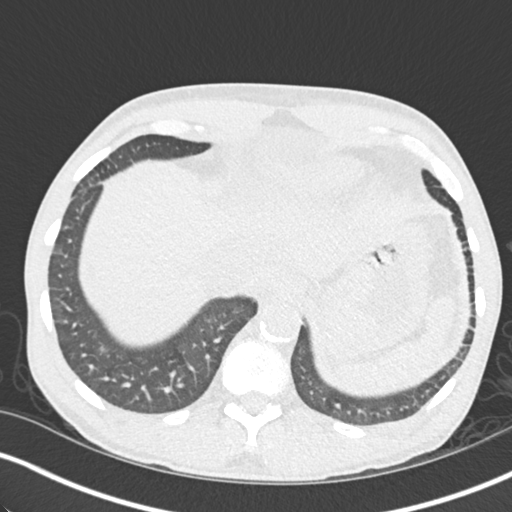
[im 46/153  lung]
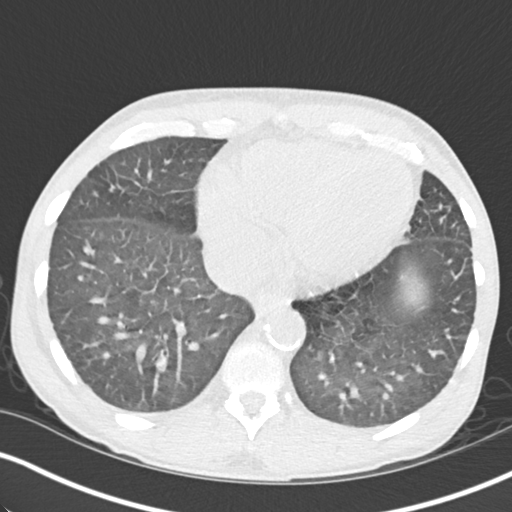
[im 57/153  mediastinal]
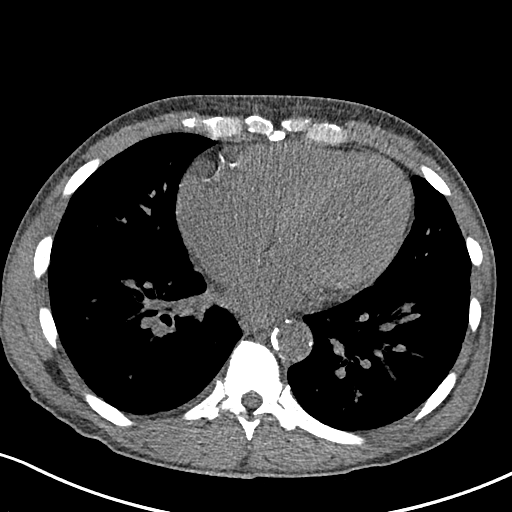
[im 57/153  lung]
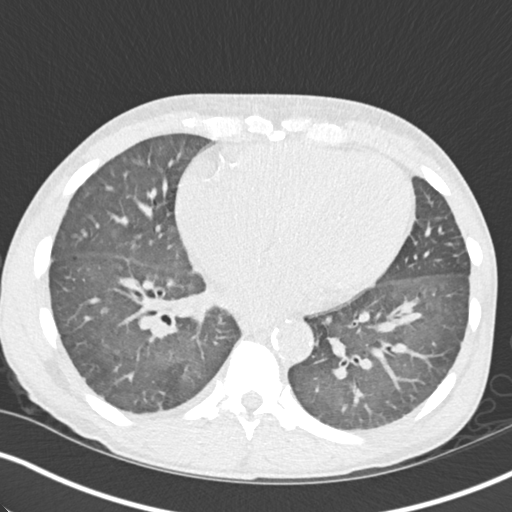
[im 68/153  lung]
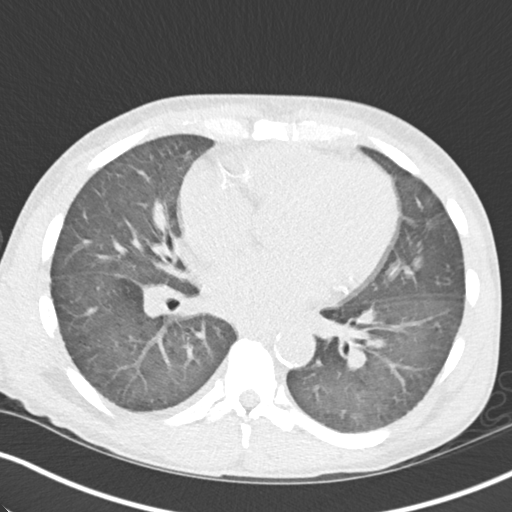
[im 85/153  lung]
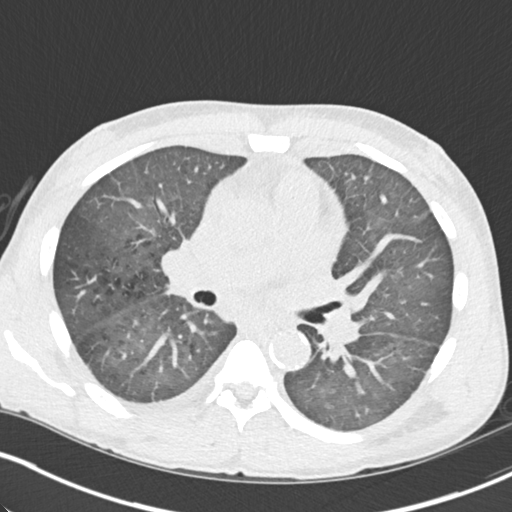
[im 96/153  lung]
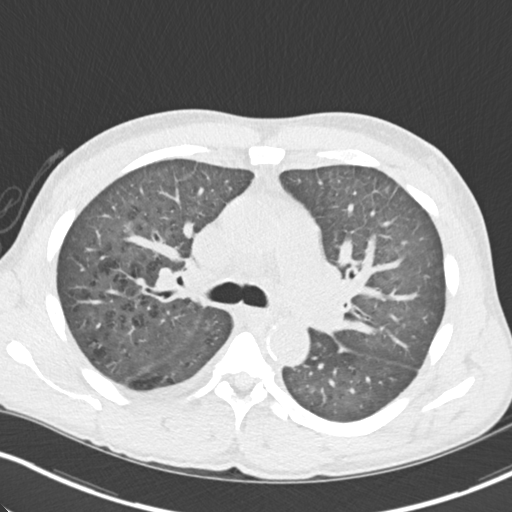
[im 107/153  mediastinal]
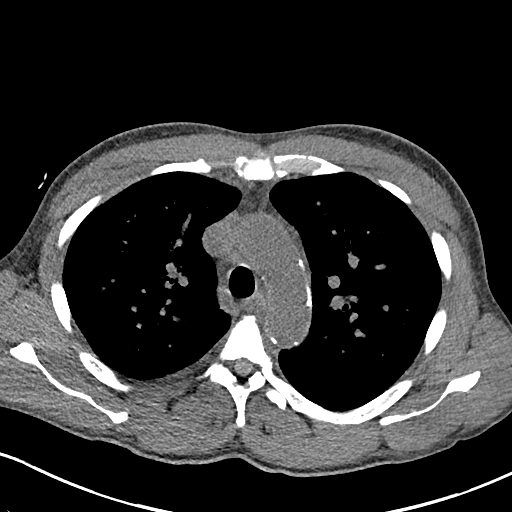
[im 107/153  lung]
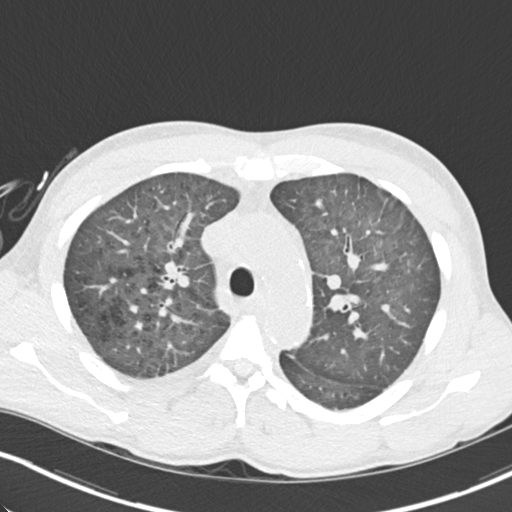
[im 119/153  lung]
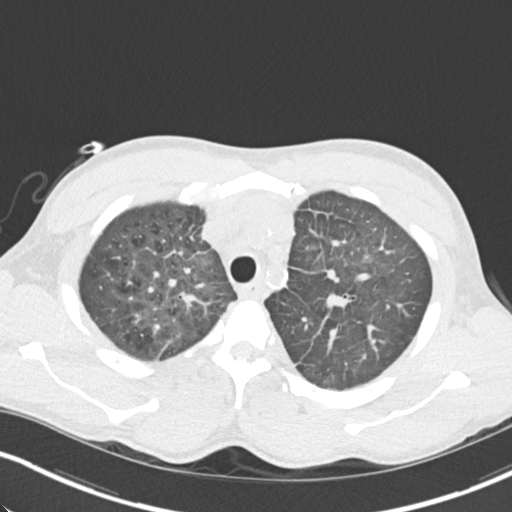
[im 130/153  lung]
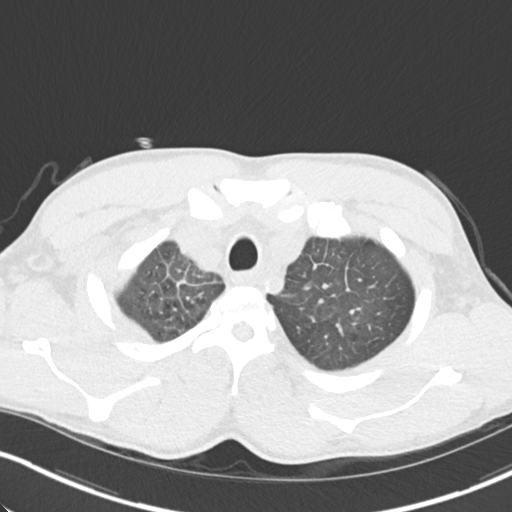
[im 141/153  lung]
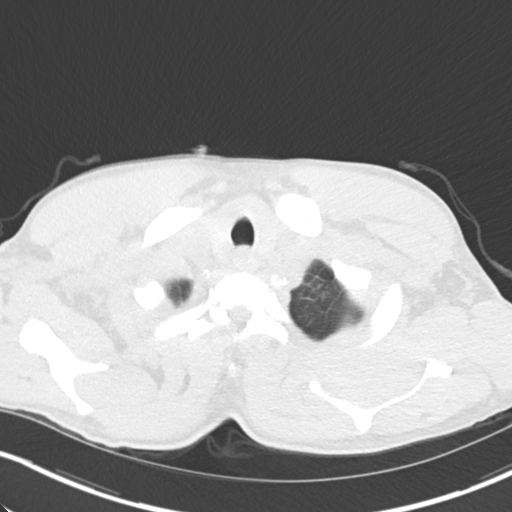

[Series 5: coronal · coronal · 0.66mm/px · 3 of 133 slices shown]
[im 27/133  lung]
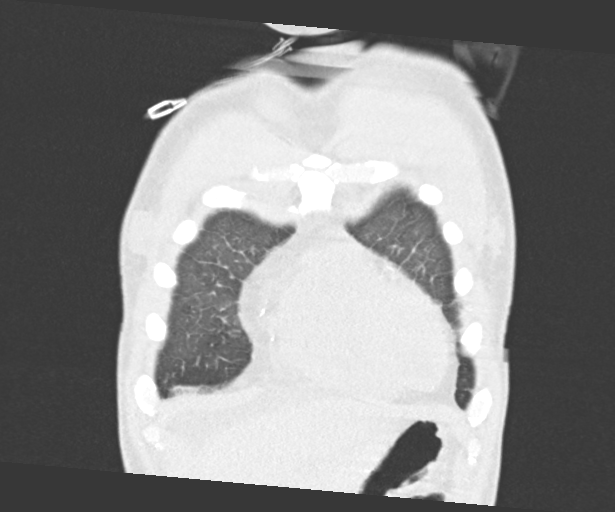
[im 53/133  lung]
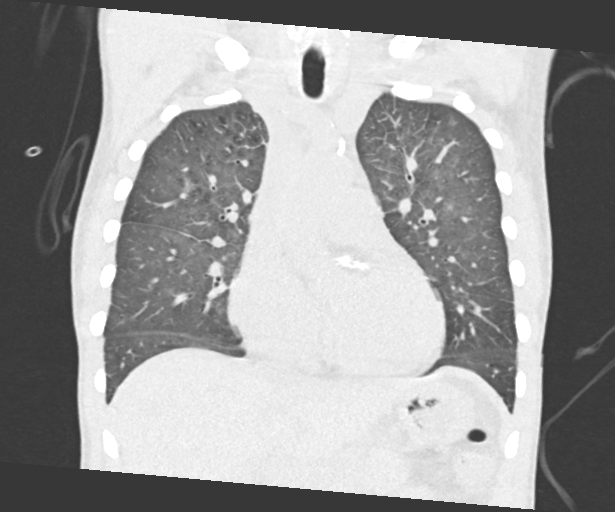
[im 80/133  lung]
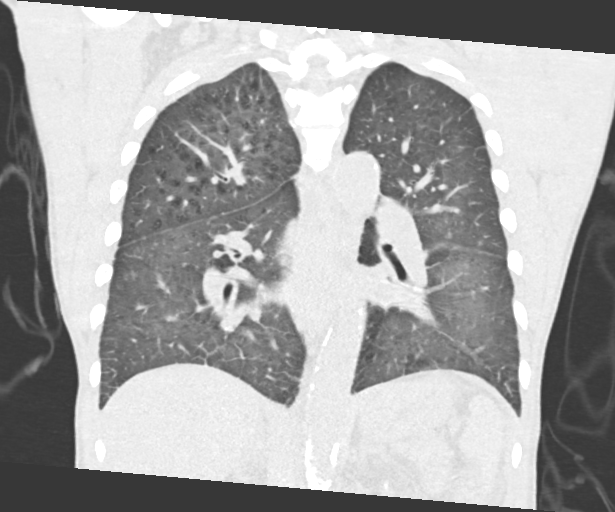

[15 of 36 positions shown; findings below may reference images not displayed]

FINDINGS: Cardiovascular: Mild cardiomegaly. Decreased attenuation of the
blood pool compatible with known anemia. Thoracic aortic
atherosclerosis without aneurysm. Three-vessel coronary artery
atherosclerosis. No pericardial effusion.

Mediastinum/Nodes: No enlarged axillary lymph nodes. Small
mediastinal lymph nodes, likely reactive. Limited assessment for
hilar lymphadenopathy in the absence of IV contrast. Unremarkable
esophagus and thyroid.

Lungs/Pleura: No pleural effusion or pneumothorax. Interlobular
septal thickening and mild ground glass opacity relatively diffusely
throughout both lungs, similar to the prior CT allowing for
increased be basilar motion artifact on the prior study. No
consolidative opacity or mass. Mild peribronchial thickening.

Upper Abdomen: Small volume ascites in the upper abdomen. Atrophic
kidneys. Heavy atherosclerotic calcification.

Musculoskeletal: Diffusely increased osseous density likely
reflecting renal osteodystrophy. Small Schmorl's nodes in the
thoracic and lumbar spine.
IMPRESSION: 1. Similar appearance of interlobular septal thickening and ground
glass opacity bilaterally likely reflecting mild edema.
2. Small volume ascites in the upper abdomen.
3. Aortic Atherosclerosis ([3I]-[3I]).

## 2019-02-11 IMAGING — DX PORTABLE CHEST - 1 VIEW
1 series · 1 of 1 positions shown · non-contrast
Comparison: Radiographs [DATE] and [DATE].

CLINICAL DATA: Coughing up blood.  Hemodialysis patient.

EXAM:
PORTABLE CHEST 1 VIEW

[chest ap]
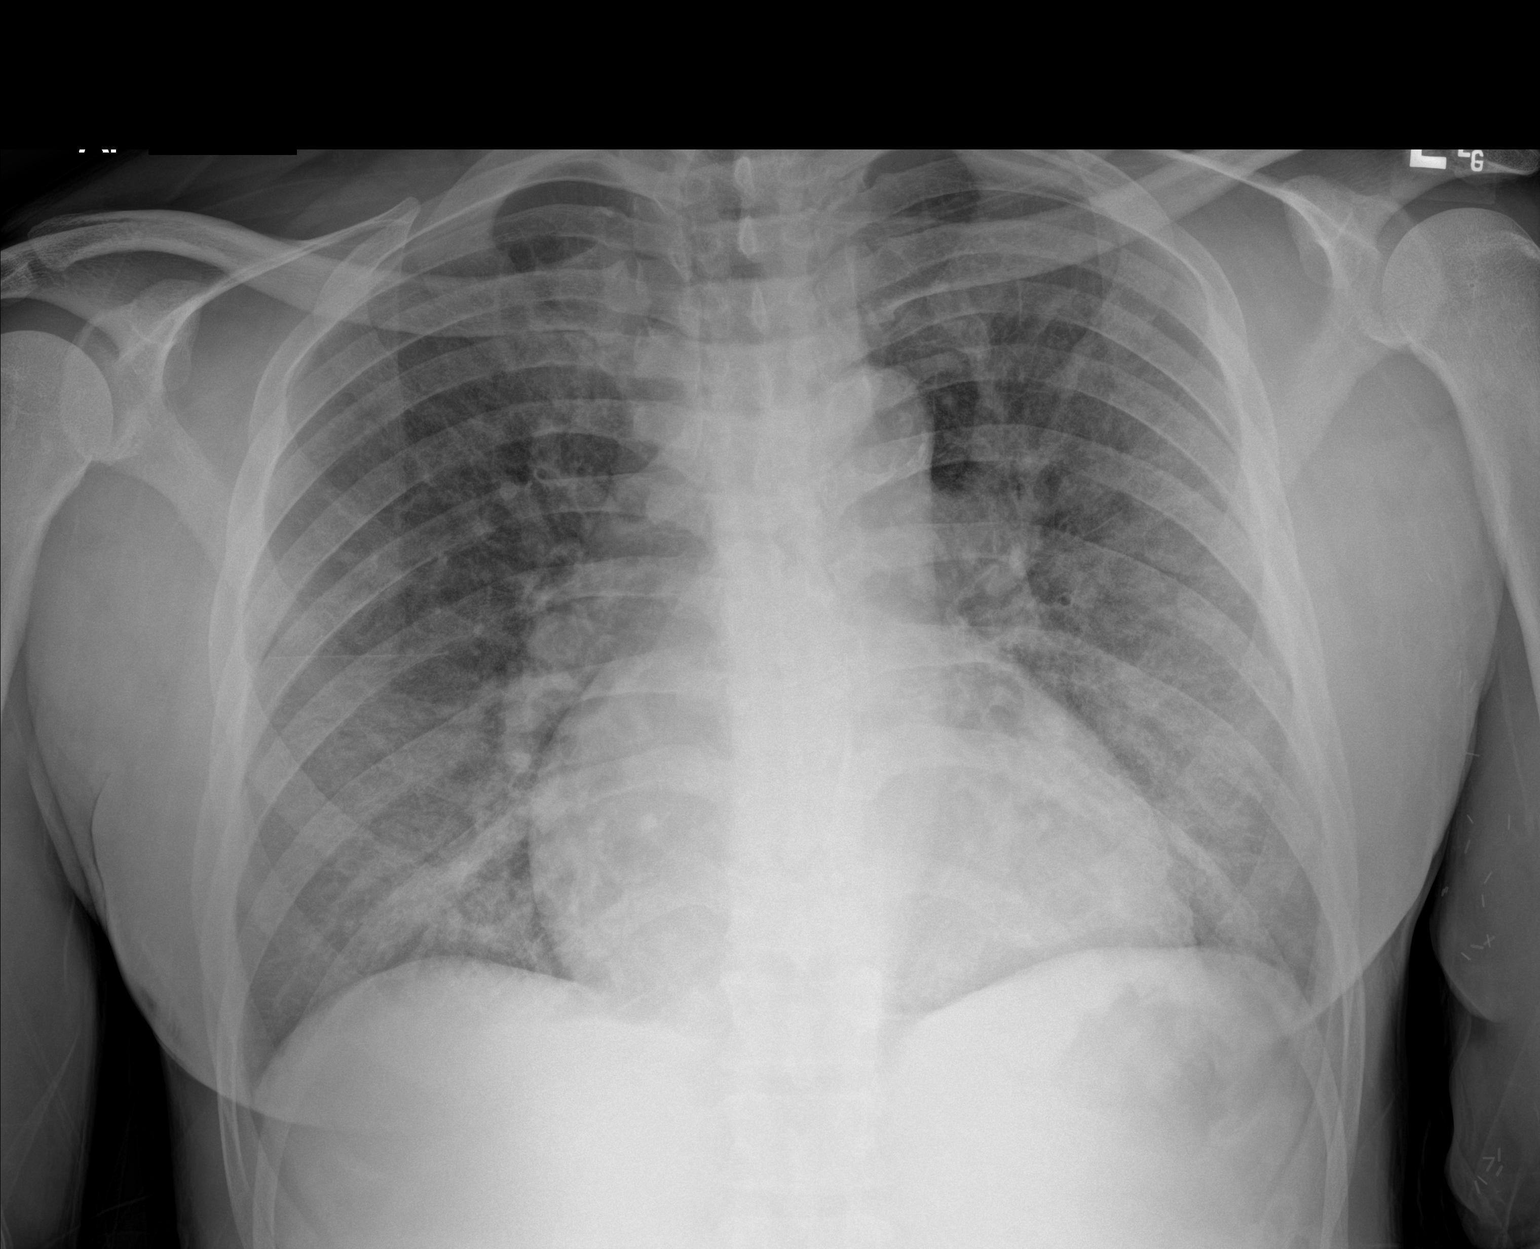

[1 of 1 positions shown; findings below may reference images not displayed]

FINDINGS: There is mild cardiomegaly with chronic vascular congestion. No
edema, confluent airspace opacity, pleural effusion or pneumothorax.
The bones appear unremarkable.
IMPRESSION: Mild cardiomegaly with chronic vascular congestion. No edema or
other acute findings.

## 2019-02-11 MED ORDER — NICOTINE 14 MG/24HR TD PT24
14.0000 mg | MEDICATED_PATCH | Freq: Every day | TRANSDERMAL | Status: DC
  Administered 2019-02-11: 14 mg via TRANSDERMAL
  Filled 2019-02-11: qty 1

## 2019-02-11 MED ORDER — DEXTROSE 50 % IV SOLN
1.0000 | Freq: Once | INTRAVENOUS | Status: AC
  Administered 2019-02-11: 50 mL via INTRAVENOUS
  Filled 2019-02-11: qty 50

## 2019-02-11 MED ORDER — CALCIUM GLUCONATE 10 % IV SOLN: 1.0000 g | Freq: Once | INTRAVENOUS | Status: DC

## 2019-02-11 MED ORDER — CALCIUM GLUCONATE-NACL 1-0.675 GM/50ML-% IV SOLN
1.0000 g | Freq: Once | INTRAVENOUS | Status: AC
  Administered 2019-02-11: 1000 mg via INTRAVENOUS
  Filled 2019-02-11: qty 50

## 2019-02-11 MED ORDER — CHLORHEXIDINE GLUCONATE CLOTH 2 % EX PADS: 6.0000 | MEDICATED_PAD | Freq: Every day | CUTANEOUS | Status: DC

## 2019-02-11 MED ORDER — DIPHENHYDRAMINE HCL 25 MG PO CAPS
25.0000 mg | ORAL_CAPSULE | Freq: Once | ORAL | Status: AC
  Administered 2019-02-11: 25 mg via ORAL
  Filled 2019-02-11: qty 1

## 2019-02-11 MED ORDER — PENTAFLUOROPROP-TETRAFLUOROETH EX AERO
1.0000 "application " | INHALATION_SPRAY | CUTANEOUS | Status: DC | PRN
  Filled 2019-02-11: qty 30

## 2019-02-11 MED ORDER — SODIUM CHLORIDE 0.9% IV SOLUTION
Freq: Once | INTRAVENOUS | Status: DC
  Filled 2019-02-11: qty 250

## 2019-02-11 MED ORDER — INSULIN ASPART 100 UNIT/ML ~~LOC~~ SOLN
5.0000 [IU] | Freq: Once | SUBCUTANEOUS | Status: AC
  Administered 2019-02-11: 17:00:00 5 [IU] via INTRAVENOUS
  Filled 2019-02-11: qty 1

## 2019-02-11 NOTE — ED Triage Notes (Signed)
Pt to ER via EMS from dialysis with c/o coughing up blood.  Pt states started on Wednesday.  Pt sent by dialysis.  States last dialysis treatment was on Wednesday and was short.

## 2019-02-11 NOTE — ED Provider Notes (Signed)
[facility] Emergency Department Provider Note   ____________________________________________    I have reviewed the triage vital signs and the nursing notes.   HISTORY  Chief Complaint Hemoptysis     HPI Jordan [name] is a 53 y.o. male with a history of chronic kidney disease on dialysis last dialysis was on Wednesday who presents today with complaints of hemoptysis.  Patient reports over the last 2 to 3 days he has been coughing up blood.  He went to see a pulmonologist yesterday but they told him they could not see him until he has been tested for coronavirus.  Today dialysis they sent him to the emergency department and would not dialyze him.  He denies fevers or chills.  No myalgias.  No known exposure to positive COVID-19 patient.  Mild shortness of breath noted.  Not on blood thinners  Past Medical History:  Diagnosis Date  . Anemia   . CKD (chronic kidney disease)    Stage 5  Dialysis - M/W/F  . ETOH abuse   . Hypertension     Patient Active Problem List   Diagnosis Date Noted  . Allergic reaction 11/30/2018  . Pulmonary edema 07/18/2018  . Anemia in ESRD (end-stage renal disease) ([place]) 05/17/2018  . Hepatitis B 03/19/2018  . Encephalopathy 03/19/2018  . 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 History of macrocytic anemia 03/19/2018  . History of iron deficiency anemia 03/19/2018  . ESRD on dialysis ([place]) 05/26/2017  . Essential hypertension 05/26/2017  . Gastritis with hemorrhage 05/26/2017  . Tobacco abuse 08/04/2016  . ETOH abuse 08/04/2016  . Uncontrolled hypertension 08/04/2016  . Acute renal failure (ARF) ([place]) 08/04/2016  . Symptomatic anemia 08/04/2016    Past Surgical History:  Procedure Laterality Date  . BASCILIC VEIN TRANSPOSITION Left 08/07/2016   Procedure: LEFT BASILIC VEIN TRANSPOSITION;  Surgeon: Angelia Mould, MD;  Location: [place];  Service: Vascular;  Laterality: Left;  . COLONOSCOPY N/A  08/12/2016   Procedure: COLONOSCOPY;  Surgeon: Otis Brace, MD;  Location: [place];  Service: Gastroenterology;  Laterality: N/A;  . ESOPHAGOGASTRODUODENOSCOPY N/A 08/12/2016   Procedure: ESOPHAGOGASTRODUODENOSCOPY (EGD);  Surgeon: Otis Brace, MD;  Location: [place];  Service: Gastroenterology;  Laterality: N/A;  . INSERTION OF DIALYSIS CATHETER N/A 08/07/2016   Procedure: INSERTION OF TUNNELED DIALYSIS CATHETER;  Surgeon: Angelia Mould, MD;  Location: [place];  Service: Vascular;  Laterality: N/A;  . LIGATION OF ARTERIOVENOUS  FISTULA Left 09/12/2016   Procedure: BANDING OF LEFT  ARTERIOVENOUS  FISTULA;  Surgeon: Angelia Mould, MD;  Location: [place];  Service: Vascular;  Laterality: Left;  . LOWER EXTREMITY INTERVENTION Right 12/02/2018   Procedure: LOWER EXTREMITY INTERVENTION;  Surgeon: Algernon Huxley, MD;  Location: [facility];  Service: Cardiovascular;  Laterality: Right;  . NO PAST SURGERIES      Prior to Admission medications   Medication Sig Start Date End Date Taking? Authorizing Provider  acetaminophen (TYLENOL) 325 MG tablet Take 2 tablets (650 mg total) by mouth every 6 (six) hours as needed for mild pain (or Fever >/= 101). Patient taking differently: Take 650 mg by mouth every 4 (four) hours as needed for mild pain.  05/17/18   Cherene Altes, MD  albuterol (PROVENTIL HFA;VENTOLIN HFA) 108 (90 Base) MCG/ACT inhaler Inhale 2 puffs into the lungs every 4 (four) hours as needed for wheezing or shortness of breath. 10/03/18   Orpah Greek, MD  amLODipine (  NORVASC) 10 MG tablet Take 1 tablet (10 mg total) by mouth daily. Patient not taking: Reported on 10/03/2018 05/17/18   Cherene Altes, MD  Darbepoetin Alfa (ARANESP) 60 MCG/0.3ML SOSY injection Inject 0.3 mLs (60 mcg total) into the vein every Monday with hemodialysis. Patient not taking: Reported on 11/30/2018 05/24/18   Cherene Altes, MD  diphenhydrAMINE (BENADRYL) 25 MG  tablet Take 25 mg by mouth every 6 (six) hours as needed for itching.    [provider]  doxycycline (VIBRAMYCIN) 100 MG capsule Take 1 capsule (100 mg total) by mouth 2 (two) times daily. 10/03/18   Orpah Greek, MD  famotidine (PEPCID) 20 MG tablet Take 1 tablet (20 mg total) by mouth daily for 5 days. 12/02/18 12/07/18  Otila Back, MD  folic acid (FOLVITE) 1 MG tablet Take 1 tablet (1 mg total) by mouth daily. Patient not taking: Reported on 10/03/2018 03/19/18   Alphonzo Grieve, MD  hydrOXYzine (ATARAX/VISTARIL) 25 MG tablet Take 2 tablets (50 mg total) by mouth every 8 (eight) hours as needed. Patient not taking: Reported on 10/03/2018 09/10/18   Ward, Delice Bison, DO  loperamide (IMODIUM) 2 MG capsule Take 4 mg by mouth as needed for diarrhea or loose stools.    [provider]  nicotine (NICODERM CQ - DOSED IN MG/24 HOURS) 14 mg/24hr patch Place 1 patch (14 mg total) onto the skin daily. 12/03/18   Stark Jock Jude, MD  predniSONE (DELTASONE) 10 MG tablet 40 mg p.o. daily x2 days Then 30 mg p.o. daily x2 days Then 20 mg p.o. daily x2 days Then 10 mg p.o. daily x2 days 12/02/18   Otila Back, MD  RENVELA 800 MG tablet Take 2,400 mg by mouth 3 (three) times daily with meals.  01/06/18   [provider]  thiamine 100 MG tablet Take 1 tablet (100 mg total) by mouth daily. 03/19/18   Alphonzo Grieve, MD     Allergies Patient has no known allergies.  Family History  Problem Relation Age of Onset  . Diabetes Mother   . Kidney failure Mother   . Kidney failure Brother   . Post-traumatic stress disorder Neg Hx   . Bladder Cancer Neg Hx   . Kidney cancer Neg Hx     Social History Social History   Tobacco Use  . Smoking status: Current Every Day Smoker    Packs/day: 0.05    Years: 15.00    Pack years: 0.75    Types: Cigarettes  . Smokeless tobacco: Never Used  . Tobacco comment: down to 1 a day  Substance Use Topics  . Alcohol use: Yes    Alcohol/week: 21.0  standard drinks    Types: 21 Cans of beer per week    Comment: none since 08/04/16  . Drug use: No    Review of Systems  Constitutional: No fever/chills Eyes: No visual changes.  ENT: No sore throat. Cardiovascular: Denies chest pain. Respiratory: As above Gastrointestinal: No abdominal pain.  No nausea, no vomiting.   Genitourinary: Negative for dysuria. Musculoskeletal: Negative for back pain. Skin: Negative for rash. Neurological: Negative for headaches   ____________________________________________   PHYSICAL EXAM:  VITAL SIGNS: ED Triage Vitals  Enc Vitals Group     BP 02/11/19 1211 (!) 153/76     Pulse Rate 02/11/19 1211 100     Resp 02/11/19 1211 20     Temp 02/11/19 1211 98.8 F (37.1 C)     Temp Source 02/11/19 1211 Oral  SpO2 02/11/19 1211 (!) 89 %     Weight 02/11/19 1216 70.3 kg (155 lb)     Height 02/11/19 1216 1.626 m (5\' 4" )     Head Circumference --      Peak Flow --      Pain Score 02/11/19 1221 0     Pain Loc --      Pain Edu? --      Excl. in [place]? --     Constitutional: Alert and oriented.  Eyes: Conjunctivae are normal.   Nose: No congestion/rhinnorhea. Mouth/Throat: Mucous membranes are moist.    Cardiovascular: Normal rate, regular rhythm. Grossly normal heart sounds.  Good peripheral circulation. Respiratory: Normal respiratory effort.  No retractions. Lungs CTAB. Gastrointestinal: Soft and nontender. No distention.  No CVA tenderness. Genitourinary: deferred Musculoskeletal: No lower extremity tenderness nor edema.  Warm and well perfused Neurologic:  Normal speech and language. No gross focal neurologic deficits are appreciated.  Skin:  Skin is warm, dry and intact. No rash noted. Psychiatric: Mood and affect are normal. Speech and behavior are normal.  ____________________________________________   LABS (all labs ordered are listed, but only abnormal results are displayed)  Labs Reviewed  CBC WITH DIFFERENTIAL/PLATELET -  Abnormal; Notable for the following components:      Result Value   RBC 1.87 (*)    Hemoglobin 6.4 (*)    HCT 20.4 (*)    MCV 109.1 (*)    MCH 34.2 (*)    RDW 15.9 (*)    Neutro Abs 8.4 (*)    Abs Immature Granulocytes 0.12 (*)    All other components within normal limits  COMPREHENSIVE METABOLIC PANEL - Abnormal; Notable for the following components:   Potassium 6.3 (*)    Chloride 96 (*)    BUN 98 (*)    Creatinine, Ser 15.76 (*)    AST 104 (*)    ALT 98 (*)    Total Bilirubin 1.4 (*)    GFR calc non Af Amer 3 (*)    GFR calc Af Amer 4 (*)    Anion gap 22 (*)    All other components within normal limits  TROPONIN I - Abnormal; Notable for the following components:   Troponin I 0.07 (*)    All other components within normal limits  PROTIME-INR - Abnormal; Notable for the following components:   Prothrombin Time 16.7 (*)    INR 1.4 (*)    All other components within normal limits  SARS CORONAVIRUS 2 (HOSPITAL ORDER, [place] [facility])  CULTURE, BLOOD (ROUTINE X 2)  CULTURE, BLOOD (ROUTINE X 2)  LACTIC ACID, PLASMA  APTT   ____________________________________________  EKG  ED ECG REPORT I, Lavonia Drafts, the attending physician, personally viewed and interpreted this ECG.  Date: 02/11/2019  Rhythm: normal sinus rhythm QRS Axis: normal Intervals: normal ST/T Wave abnormalities: normal Narrative Interpretation: no evidence of acute ischemia  ____________________________________________  RADIOLOGY  Chest x-ray unremarkable ____________________________________________   PROCEDURES  Procedure(s) performed: No  Procedures   Critical Care performed: No ____________________________________________   INITIAL IMPRESSION / ASSESSMENT AND PLAN / ED COURSE  Pertinent labs & imaging results that were available during my care of the patient were reviewed by me and considered in my medical decision making (see chart for details).  Patient  presents with reports of hemoptysis, chest x-ray overall reassuring, exam is reassuring he is well-appearing and appears that he has been having intermittent symptoms over the last several days.  Initial pulse oximetry of 89% however, started on nasal cannula oxygen.  Pending labs further eval  ----------------------------------------- 3:18 PM on 02/11/2019 -----------------------------------------  Patient with elevated potassium of 6.3 likely related to missing dialysis today.  Require admission for dialysis have discussed with the hospitalist for admission.  Pending callback from nephrology    ____________________________________________   FINAL CLINICAL IMPRESSION(S) / ED DIAGNOSES  Final diagnoses:  Hyperkalemia  Anemia, unspecified type  Hemoptysis        Note:  This document was prepared using Dragon voice recognition software and may include unintentional dictation errors.   Lavonia Drafts, MD 02/11/19 [phone]

## 2019-02-11 NOTE — ED Notes (Signed)
Date and time results received: 02/11/19 3:06 PM  Test: K, Troponin Critical Value: 6.2, 0.07  Name of Provider Notified: Corky Downs

## 2019-02-11 NOTE — TOC Initial Note (Signed)
Transition of Care [facility]) - Initial/Assessment Note    Patient Details  Name: Jordan [name] MRN: [id number] Date of Birth: Feb 17, 1966  Transition of [facility]) CM/SW Contact:    Shelbie Hutching, RN Phone Number: 02/11/2019, 3:29 PM  Clinical Narrative:                 Patient is being seen in the ED for hemoptysis x 3 days, patient went to dialysis but they would not dialyze him and sent him to the ED.  Patient will be admitted and Elvera Bicker with patient pathways notified that patient is here.  Patient is currently waiting on a bed in the isolation ward.  Patient is negative for COVID.    Expected Discharge Plan: Home/Self Care Barriers to Discharge: Continued Medical Work up   Patient Goals and CMS Choice        Expected Discharge Plan and Services Expected Discharge Plan: Home/Self Care                                              Prior Living Arrangements/Services   Lives with:: Relatives                Criminal Activity/Legal Involvement Pertinent to Current Situation/Hospitalization: No - Comment as needed  Activities of Daily Living      Permission Sought/Granted                  Emotional Assessment              Admission diagnosis:  vomiting blood Patient Active Problem List   Diagnosis Date Noted  . Allergic reaction 11/30/2018  . Pulmonary edema 07/18/2018  . Anemia in ESRD (end-stage renal disease) ([place]) 05/17/2018  . Hepatitis B 03/19/2018  . Encephalopathy 03/19/2018  . 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 History of macrocytic anemia 03/19/2018  . History of iron deficiency anemia 03/19/2018  . ESRD on dialysis ([place]) 05/26/2017  . Essential hypertension 05/26/2017  . Gastritis with hemorrhage 05/26/2017  . Tobacco abuse 08/04/2016  . ETOH abuse 08/04/2016  . Uncontrolled hypertension 08/04/2016  . Acute renal failure (ARF) ([place]) 08/04/2016  . Symptomatic anemia 08/04/2016   PCP:  Lorelee Market, MD Pharmacy:   [facility] [place] [address] Phone: [phone] Fax: [phone]     Social Determinants of Health (SDOH) Interventions    Readmission Risk Interventions No flowsheet data found.

## 2019-02-11 NOTE — H&P (Signed)
[facility] NAME: Jordan [name]    MR#:  [id number]  DATE OF BIRTH:  [birth year]  DATE OF ADMISSION:  02/11/2019  PRIMARY CARE PHYSICIAN: Lorelee Market, MD   REQUESTING/REFERRING PHYSICIAN: Lavonia Drafts  CHIEF COMPLAINT:   Chief Complaint  Patient presents with   Hemoptysis    HISTORY OF PRESENT ILLNESS:  Jordan [name]  is a 53 y.o. male with a known history of end-stage renal disease on hemodialysis on Mondays Wednesdays and Fridays, hypertension , anemia of chronic disease and tobacco abuse who presented to the emergency room today with complaints of hemoptysis.  Patient has been coughing up blood over the last 3 days.  Described amount as teaspoon sized.  More at night.  No fevers.  No chest pain.  No nausea vomiting or diarrhea.  Patient had gone for hemodialysis today but was not dialyzed.  Was sent to the emergency room for further evaluation.  COVID virus testing done was negative.  Patient not on any blood thinners.  Chest x-ray done in the emergency room with mild cardiomegaly with chronic vascular congestion without acute findings.  Patient subsequently had CT chest without contrast done which showed findings reflecting mild edema.  Small volume ascites in the upper abdomen.  Patient noted to have potassium of 6.3.  Hemoglobin of 6.4.  Hyperkalemia being treated medically in the emergency room.  I discussed with nephrologist and plan is to have patient dialyzed today.  2 units of packed red blood cells has been ordered to be transfused during hemodialysis.  PAST MEDICAL HISTORY:   Past Medical History:  Diagnosis Date   Anemia    CKD (chronic kidney disease)    Stage 5  Dialysis - M/W/F   ETOH abuse    Hypertension     PAST SURGICAL HISTORY:   Past Surgical History:  Procedure Laterality Date   BASCILIC VEIN TRANSPOSITION Left 08/07/2016   Procedure: LEFT BASILIC VEIN TRANSPOSITION;  Surgeon:  Angelia Mould, MD;  Location: [facility];  Service: Vascular;  Laterality: Left;   COLONOSCOPY N/A 08/12/2016   Procedure: COLONOSCOPY;  Surgeon: Otis Brace, MD;  Location: [facility];  Service: Gastroenterology;  Laterality: N/A;   ESOPHAGOGASTRODUODENOSCOPY N/A 08/12/2016   Procedure: ESOPHAGOGASTRODUODENOSCOPY (EGD);  Surgeon: Otis Brace, MD;  Location: [facility];  Service: Gastroenterology;  Laterality: N/A;   INSERTION OF DIALYSIS CATHETER N/A 08/07/2016   Procedure: INSERTION OF TUNNELED DIALYSIS CATHETER;  Surgeon: Angelia Mould, MD;  Location: [facility];  Service: Vascular;  Laterality: N/A;   LIGATION OF ARTERIOVENOUS  FISTULA Left 09/12/2016   Procedure: BANDING OF LEFT  ARTERIOVENOUS  FISTULA;  Surgeon: Angelia Mould, MD;  Location: [facility];  Service: Vascular;  Laterality: Left;   LOWER EXTREMITY INTERVENTION Right 12/02/2018   Procedure: LOWER EXTREMITY INTERVENTION;  Surgeon: Algernon Huxley, MD;  Location: [facility];  Service: Cardiovascular;  Laterality: Right;   NO PAST SURGERIES      SOCIAL HISTORY:   Social History   Tobacco Use   Smoking status: Current Every Day Smoker    Packs/day: 0.05    Years: 15.00    Pack years: 0.75    Types: Cigarettes   Smokeless tobacco: Never Used   Tobacco comment: down to 1 a day  Substance Use Topics   Alcohol use: Yes    Alcohol/week: 21.0 standard drinks    Types: 21 Cans of beer per week    Comment: none  since 08/04/16    FAMILY HISTORY:   Family History  Problem Relation Age of Onset   Diabetes Mother    Kidney failure Mother    Kidney failure Brother    Post-traumatic stress disorder Neg Hx    Bladder Cancer Neg Hx    Kidney cancer Neg Hx     DRUG ALLERGIES:  No Known Allergies  REVIEW OF SYSTEMS:   Review of Systems  Constitutional: Negative for chills and fever.  HENT: Negative for hearing loss and tinnitus.   Eyes: Negative for blurred vision and double  vision.  Respiratory: Positive for cough and hemoptysis. Negative for shortness of breath.   Cardiovascular: Negative for chest pain and palpitations.  Gastrointestinal: Negative for abdominal pain, heartburn, nausea and vomiting.  Genitourinary: Negative for dysuria and urgency.  Musculoskeletal: Negative for myalgias and neck pain.  Skin: Negative for itching and rash.  Neurological: Negative for dizziness and headaches.  Psychiatric/Behavioral: Negative for depression and substance abuse.    MEDICATIONS AT HOME:   Prior to Admission medications   Medication Sig Start Date End Date Taking? Authorizing Provider  acetaminophen (TYLENOL) 325 MG tablet Take 2 tablets (650 mg total) by mouth every 6 (six) hours as needed for mild pain (or Fever >/= 101). Patient taking differently: Take 650 mg by mouth every 4 (four) hours as needed for mild pain.  05/17/18   Cherene Altes, MD  albuterol (PROVENTIL HFA;VENTOLIN HFA) 108 (90 Base) MCG/ACT inhaler Inhale 2 puffs into the lungs every 4 (four) hours as needed for wheezing or shortness of breath. 10/03/18   Orpah Greek, MD  amLODipine (NORVASC) 10 MG tablet Take 1 tablet (10 mg total) by mouth daily. Patient not taking: Reported on 10/03/2018 05/17/18   Cherene Altes, MD  Darbepoetin Alfa (ARANESP) 60 MCG/0.3ML SOSY injection Inject 0.3 mLs (60 mcg total) into the vein every Monday with hemodialysis. Patient not taking: Reported on 11/30/2018 05/24/18   Cherene Altes, MD  diphenhydrAMINE (BENADRYL) 25 MG tablet Take 25 mg by mouth every 6 (six) hours as needed for itching.    [provider]  doxycycline (VIBRAMYCIN) 100 MG capsule Take 1 capsule (100 mg total) by mouth 2 (two) times daily. 10/03/18   Orpah Greek, MD  famotidine (PEPCID) 20 MG tablet Take 1 tablet (20 mg total) by mouth daily for 5 days. 12/02/18 12/07/18  Otila Back, MD  folic acid (FOLVITE) 1 MG tablet Take 1 tablet (1 mg total) by mouth  daily. Patient not taking: Reported on 10/03/2018 03/19/18   Alphonzo Grieve, MD  hydrOXYzine (ATARAX/VISTARIL) 25 MG tablet Take 2 tablets (50 mg total) by mouth every 8 (eight) hours as needed. Patient not taking: Reported on 10/03/2018 09/10/18   Ward, Delice Bison, DO  loperamide (IMODIUM) 2 MG capsule Take 4 mg by mouth as needed for diarrhea or loose stools.    [provider]  nicotine (NICODERM CQ - DOSED IN MG/24 HOURS) 14 mg/24hr patch Place 1 patch (14 mg total) onto the skin daily. 12/03/18   Stark Jock Balen Woolum, MD  predniSONE (DELTASONE) 10 MG tablet 40 mg p.o. daily x2 days Then 30 mg p.o. daily x2 days Then 20 mg p.o. daily x2 days Then 10 mg p.o. daily x2 days 12/02/18   Otila Back, MD  RENVELA 800 MG tablet Take 2,400 mg by mouth 3 (three) times daily with meals.  01/06/18   [provider]  thiamine 100 MG tablet Take 1 tablet (100 mg  total) by mouth daily. 03/19/18   Alphonzo Grieve, MD      VITAL SIGNS:  Blood pressure (!) 153/76, pulse 100, temperature 98.8 F (37.1 C), temperature source Oral, resp. rate 20, height 5\' 4"  (1.626 m), weight 70.3 kg, SpO2 (!) 89 %.  PHYSICAL EXAMINATION:  Physical Exam  GENERAL:  53 y.o.-year-old patient lying in the bed with no acute distress.  EYES: Pupils equal, round, reactive to light and accommodation. No scleral icterus. Extraocular muscles intact.  HEENT: Head atraumatic, normocephalic. Oropharynx and nasopharynx clear.  NECK:  Supple, no jugular venous distention. No thyroid enlargement, no tenderness.  LUNGS: Normal breath sounds bilaterally, no wheezing, rales,rhonchi or crepitation. No use of accessory muscles of respiration.  CARDIOVASCULAR: S1, S2 normal. No murmurs, rubs, or gallops.  ABDOMEN: Soft, nontender, nondistended. Bowel sounds present. No organomegaly or mass.  EXTREMITIES: No pedal edema, cyanosis, or clubbing.  NEUROLOGIC: Cranial nerves II through XII are intact. Muscle strength 5/5 in all extremities.  Sensation intact. Gait not checked.  PSYCHIATRIC: The patient is alert and oriented x 3.  SKIN: No obvious rash, lesion, or ulcer.   LABORATORY PANEL:   CBC Recent Labs  Lab 02/11/19 1354  WBC 10.4  HGB 6.4*  HCT 20.4*  PLT 155   ------------------------------------------------------------------------------------------------------------------  Chemistries  Recent Labs  Lab 02/11/19 1354  NA 140  K 6.3*  CL 96*  CO2 22  GLUCOSE 87  BUN 98*  CREATININE 15.76*  CALCIUM 9.0  AST 104*  ALT 98*  ALKPHOS 65  BILITOT 1.4*   ------------------------------------------------------------------------------------------------------------------  Cardiac Enzymes Recent Labs  Lab 02/11/19 1354  TROPONINI 0.07*   ------------------------------------------------------------------------------------------------------------------  RADIOLOGY:  Ct Chest Wo Contrast  Result Date: 02/11/2019 CLINICAL DATA:  Hemoptysis. Shortness of breath. On dialysis. EXAM: CT CHEST WITHOUT CONTRAST TECHNIQUE: Multidetector CT imaging of the chest was performed following the standard protocol without IV contrast. COMPARISON:  Chest radiograph 02/11/2019 and CTA 11/15/2018 FINDINGS: Cardiovascular: Mild cardiomegaly. Decreased attenuation of the blood pool compatible with known anemia. Thoracic aortic atherosclerosis without aneurysm. Three-vessel coronary artery atherosclerosis. No pericardial effusion. Mediastinum/Nodes: No enlarged axillary lymph nodes. Small mediastinal lymph nodes, likely reactive. Limited assessment for hilar lymphadenopathy in the absence of IV contrast. Unremarkable esophagus and thyroid. Lungs/Pleura: No pleural effusion or pneumothorax. Interlobular septal thickening and mild ground glass opacity relatively diffusely throughout both lungs, similar to the prior CT allowing for increased be basilar motion artifact on the prior study. No consolidative opacity or mass. Mild peribronchial  thickening. Upper Abdomen: Small volume ascites in the upper abdomen. Atrophic kidneys. Heavy atherosclerotic calcification. 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 Small Schmorl's nodes in the thoracic and lumbar spine. IMPRESSION: 1. Similar appearance of interlobular septal thickening and ground glass opacity bilaterally likely reflecting mild edema. 2. Small volume ascites in the upper abdomen. 3. Aortic Atherosclerosis (ICD10-I70.0). Electronically Signed   By: Logan Bores M.D.   On: 02/11/2019 16:45   Dg Chest Port 1 View  Result Date: 02/11/2019 CLINICAL DATA:  Coughing up blood.  Hemodialysis patient. EXAM: PORTABLE CHEST 1 VIEW COMPARISON:  Radiographs 05/17/2018 and 03/19/2018. FINDINGS: There is mild cardiomegaly with chronic vascular congestion. No edema, confluent airspace opacity, pleural effusion or pneumothorax. The bones appear unremarkable. IMPRESSION: Mild cardiomegaly with chronic vascular congestion. No edema or other acute findings. Electronically Signed   By: Richardean Sale M.D.   On: 02/11/2019 12:50      IMPRESSION AND PLAN:  Patient is a 53 year old African-American male with  history of end-stage renal disease on hemodialysis on Mondays Wednesdays and Fridays and hypertension who presented with hemoptysis.  Found to have hemoglobin of 6.4 and potassium of 6.3.  Being admitted for further management.  1.  Hemoptysis Described as mild.  Teaspoon sized.  No further hemoptysis since last night.  No chest pain.  No fevers. CT chest done with findings consistent with mild edema. Avoid all antiplatelet agents or anticoagulants. Monitor  2.  Acute blood loss anemia with hemoglobin of 6.4 Secondary to hemoptysis. Superimposed on anemia of chronic disease.  To be transfused with 2 units of packed red blood cells during hemodialysis today  3.  End-stage renal disease on hemodialysis. Patient due for hemodialysis today.   Nephrologist already making arrangements for hemodialysis today.  4.  Hyperkalemia with potassium of 6.3 Being treated medically in the emergency room.  To be dialyzed today as well.  5.  Tobacco abuse Smoking cessation counseling done.  Patient motivated to cut down.  Placed on nicotine patch.  DVT prophylaxis; SCDs No heparin products due to presentation with hemoptysis.    All the records are reviewed and case discussed with ED provider. Management plans discussed with the patient, family and they are in agreement.  CODE STATUS: Full code  TOTAL TIME TAKING CARE OF THIS PATIENT: 63 minutes.    Nithin Demeo M.D on 02/11/2019 at 4:57 PM  Between 7am to 6pm - Pager - [phone]  After 6pm go to [email or web address] - Proofreader  [facility]  Office  [phone]  CC: Primary care physician; Lorelee Market, MD   Note: This dictation was prepared with Dragon dictation along with smaller phrase technology. Any transcriptional errors that result from this process are unintentional.

## 2019-02-11 NOTE — Progress Notes (Signed)
Pre HD Assessment    02/11/19 2127  Neurological  Level of Consciousness Alert  Orientation Level Oriented X4  Respiratory  Respiratory Pattern Regular;Labored  Chest Assessment Chest expansion symmetrical  Bilateral Breath Sounds Diminished;Fine crackles  Cough Congested  Cardiac  Pulse Regular  Heart Sounds S1, S2  ECG Monitor Yes  Cardiac Rhythm ST  Vascular  R Radial Pulse +2  L Radial Pulse +2  Edema Generalized;Facial  Generalized Edema +2  Psychosocial  Psychosocial (WDL) WDL

## 2019-02-11 NOTE — Progress Notes (Signed)
Pt is off the unit to dialysis.  

## 2019-02-11 NOTE — Progress Notes (Signed)
Pre HD Assessment    02/11/19 2027  Neurological  Level of Consciousness Alert  Orientation Level Oriented X4  Respiratory  Respiratory Pattern Regular;Labored  Chest Assessment Chest expansion symmetrical  Bilateral Breath Sounds Diminished;Fine crackles  Cough Congested  Cardiac  Pulse Regular  Heart Sounds S1, S2  ECG Monitor Yes  Cardiac Rhythm ST  Vascular  R Radial Pulse +2  L Radial Pulse +2  Edema Generalized;Facial  Generalized Edema +2  Psychosocial  Psychosocial (WDL) WDL

## 2019-02-11 NOTE — Progress Notes (Signed)
Shortly after inititaing TX, pt became flushed, felt hot and nauseated. MD notified. UF turned off, BFR reduced Pt O2 increased. Pt encouraged to try slow deep breathing and after 10 minutes began to start to feel better, TX resumed as ordered, pt is stable.    02/11/19 2053  Vital Signs  Resp (!) 23  BP (!) 159/103  During Hemodialysis Assessment  Blood Flow Rate (mL/min) 400 mL/min  Arterial Pressure (mmHg) -120 mmHg  Venous Pressure (mmHg) 230 mmHg  Transmembrane Pressure (mmHg) 60 mmHg  Ultrafiltration Rate (mL/min) 880 mL/min  Dialysate Flow Rate (mL/min) 600 ml/min  Conductivity: Machine  13.9  HD Safety Checks Performed Yes  Intra-Hemodialysis Comments See progress note

## 2019-02-11 NOTE — ED Notes (Signed)
ED TO INPATIENT HANDOFF REPORT  ED Nurse Name and Phone #: Anderson Malta [phone]  S Name/Age/Gender Jordan [name] 53 y.o. male Room/Bed: ED32A/ED32A  Code Status   Code Status: Full Code  Home/SNF/Other Home Patient oriented to: self, place, time and situation Is this baseline? Yes   Triage Complete: Triage complete  Chief Complaint vomiting blood  Triage Note Pt to ER via EMS from dialysis with c/o coughing up blood.  Pt states started on Wednesday.  Pt sent by dialysis.  States last dialysis treatment was on Wednesday and was short.   Allergies No Known Allergies  Level of Care/Admitting Diagnosis ED Disposition    ED Disposition Condition [facility] Area: [place] [[id number]]  Level of Care: Telemetry [5]  Covid Evaluation: Screening Protocol (No Symptoms)  Diagnosis: Hemoptysis [213086]  Admitting Physician: Otila Back [[id number]]  Attending Physician: Otila Back [[id number]]  Estimated length of stay: past midnight tomorrow  Certification:: I certify this patient will need inpatient services for at least 2 midnights  PT Class (Do Not Modify): Inpatient [101]  PT Acc Code (Do Not Modify): Private [1]       B Medical/Surgery History Past Medical History:  Diagnosis Date  . Anemia   . CKD (chronic kidney disease)    Stage 5  Dialysis - M/W/F  . ETOH abuse   . Hypertension    Past Surgical History:  Procedure Laterality Date  . BASCILIC VEIN TRANSPOSITION Left 08/07/2016   Procedure: LEFT BASILIC VEIN TRANSPOSITION;  Surgeon: Angelia Mould, MD;  Location: [place];  Service: Vascular;  Laterality: Left;  . COLONOSCOPY N/A 08/12/2016   Procedure: COLONOSCOPY;  Surgeon: Otis Brace, MD;  Location: [place];  Service: Gastroenterology;  Laterality: N/A;  . ESOPHAGOGASTRODUODENOSCOPY N/A 08/12/2016   Procedure: ESOPHAGOGASTRODUODENOSCOPY (EGD);  Surgeon: Otis Brace, MD;  Location: [place];  Service:  Gastroenterology;  Laterality: N/A;  . INSERTION OF DIALYSIS CATHETER N/A 08/07/2016   Procedure: INSERTION OF TUNNELED DIALYSIS CATHETER;  Surgeon: Angelia Mould, MD;  Location: [place];  Service: Vascular;  Laterality: N/A;  . LIGATION OF ARTERIOVENOUS  FISTULA Left 09/12/2016   Procedure: BANDING OF LEFT  ARTERIOVENOUS  FISTULA;  Surgeon: Angelia Mould, MD;  Location: [place];  Service: Vascular;  Laterality: Left;  . LOWER EXTREMITY INTERVENTION Right 12/02/2018   Procedure: LOWER EXTREMITY INTERVENTION;  Surgeon: Algernon Huxley, MD;  Location: [facility];  Service: Cardiovascular;  Laterality: Right;  . NO PAST SURGERIES       A IV Location/Drains/Wounds Patient Lines/Drains/Airways Status   Active Line/Drains/Airways    Name:   Placement date:   Placement time:   Site:   Days:   Fistula / Graft Left Upper arm Arteriovenous fistula   -    -    Upper arm             Intake/Output Last 24 hours No intake or output data in the 24 hours ending 02/11/19 1656  Labs/Imaging Results for orders placed or performed during the hospital encounter of 02/11/19 (from the past 48 hour(s))  CBC with Differential     Status: Abnormal   Collection Time: 02/11/19  1:54 PM  Result Value Ref Range   WBC 10.4 4.0 - 10.5 K/uL   RBC 1.87 (L) 4.22 - 5.81 MIL/uL   Hemoglobin 6.4 (L) 13.0 - 17.0 g/dL   HCT 20.4 (L) 39.0 - 52.0 %   MCV 109.1 (H) 80.0 -  100.0 fL   MCH 34.2 (H) 26.0 - 34.0 pg   MCHC 31.4 30.0 - 36.0 g/dL   RDW 15.9 (H) 11.5 - 15.5 %   Platelets 155 150 - 400 K/uL   nRBC 0.2 0.0 - 0.2 %   Neutrophils Relative % 82 %   Neutro Abs 8.4 (H) 1.7 - 7.7 K/uL   Lymphocytes Relative 10 %   Lymphs Abs 1.1 0.7 - 4.0 K/uL   Monocytes Relative 6 %   Monocytes Absolute 0.7 0.1 - 1.0 K/uL   Eosinophils Relative 1 %   Eosinophils Absolute 0.1 0.0 - 0.5 K/uL   Basophils Relative 0 %   Basophils Absolute 0.0 0.0 - 0.1 K/uL   Immature Granulocytes 1 %   Abs Immature  Granulocytes 0.12 (H) 0.00 - 0.07 K/uL    Comment: Performed at [facility], [address] [place], [place] [address]  Comprehensive metabolic panel     Status: Abnormal   Collection Time: 02/11/19  1:54 PM  Result Value Ref Range   Sodium 140 135 - 145 mmol/L   Potassium 6.3 (HH) 3.5 - 5.1 mmol/L    Comment: CRITICAL RESULT CALLED TO, READ BACK BY AND VERIFIED WITH Carleta Woodrow AT 1450 ON 02/11/19 KLM RESULTS VERIFIED BY REPEAT TESTING    Chloride 96 (L) 98 - 111 mmol/L   CO2 22 22 - 32 mmol/L   Glucose, Bld 87 70 - 99 mg/dL   BUN 98 (H) 6 - 20 mg/dL   Creatinine, Ser 15.76 (H) 0.61 - 1.24 mg/dL   Calcium 9.0 8.9 - 10.3 mg/dL   Total Protein 6.5 6.5 - 8.1 g/dL   Albumin 3.5 3.5 - 5.0 g/dL   AST 104 (H) 15 - 41 U/L   ALT 98 (H) 0 - 44 U/L   Alkaline Phosphatase 65 38 - 126 U/L   Total Bilirubin 1.4 (H) 0.3 - 1.2 mg/dL   GFR calc non Af Amer 3 (L) >60 mL/min   GFR calc Af Amer 4 (L) >60 mL/min   Anion gap 22 (H) 5 - 15    Comment: Performed at [facility], [address] [place], [place] [address]  Troponin I - ONCE - STAT     Status: Abnormal   Collection Time: 02/11/19  1:54 PM  Result Value Ref Range   Troponin I 0.07 (HH) <0.03 ng/mL    Comment: CRITICAL RESULT CALLED TO, READ BACK BY AND VERIFIED WITH Karlisa Gaubert AT 1450 ON 02/11/19 KLM Performed at [facility], [address] [place], [place] [address]   Lactic acid, plasma     Status: None   Collection Time: 02/11/19  1:54 PM  Result Value Ref Range   Lactic Acid, Venous 1.7 0.5 - 1.9 mmol/L    Comment: Performed at [facility], [address] [place], [place] [address]  APTT     Status: None   Collection Time: 02/11/19  1:54 PM  Result Value Ref Range   aPTT 29 24 - 36 seconds    Comment: Performed at [facility], [address] [place], [place] [address]  Protime-INR     Status: Abnormal   Collection Time: 02/11/19  1:54 PM  Result Value Ref Range    Prothrombin Time 16.7 (H) 11.4 - 15.2 seconds   INR 1.4 (H) 0.8 - 1.2    Comment: (NOTE) INR goal varies based on device and disease states. Performed at [facility], [address],  [place], [place] [address]   SARS Coronavirus 2 (CEPHEID- Performed in [place] hospital lab), Hosp Order     Status: None   Collection Time: 02/11/19  2:00 PM  Result Value Ref Range   SARS Coronavirus 2 NEGATIVE NEGATIVE    Comment: (NOTE) If result is NEGATIVE SARS-CoV-2 target nucleic acids are NOT DETECTED. The SARS-CoV-2 RNA is generally detectable in upper and lower  respiratory specimens during the acute phase of infection. The lowest  concentration of SARS-CoV-2 viral copies this assay can detect is 250  copies / mL. A negative result does not preclude SARS-CoV-2 infection  and should not be used as the sole basis for treatment or other  patient management decisions.  A negative result may occur with  improper specimen collection / handling, submission of specimen other  than nasopharyngeal swab, presence of viral mutation(s) within the  areas targeted by this assay, and inadequate number of viral copies  (<250 copies / mL). A negative result must be combined with clinical  observations, patient history, and epidemiological information. If result is POSITIVE SARS-CoV-2 target nucleic acids are DETECTED. The SARS-CoV-2 RNA is generally detectable in upper and lower  respiratory specimens dur ing the acute phase of infection.  Positive  results are indicative of active infection with SARS-CoV-2.  Clinical  correlation with patient history and other diagnostic information is  necessary to determine patient infection status.  Positive results do  not rule out bacterial infection or co-infection with other viruses. If result is PRESUMPTIVE POSTIVE SARS-CoV-2 nucleic acids MAY BE PRESENT.   A presumptive positive result was obtained on the submitted specimen  and confirmed on  repeat testing.  While 2019 novel coronavirus  (SARS-CoV-2) nucleic acids may be present in the submitted sample  additional confirmatory testing may be necessary for epidemiological  and / or clinical management purposes  to differentiate between  SARS-CoV-2 and other Sarbecovirus currently known to infect humans.  If clinically indicated additional testing with an alternate test  methodology [phone]) is advised. The SARS-CoV-2 RNA is generally  detectable in upper and lower respiratory sp ecimens during the acute  phase of infection. The expected result is Negative. Fact Sheet for Patients:  StrictlyIdeas.no Fact Sheet for Healthcare Providers: BankingDealers.co.za This test is not yet approved or cleared by the [place] FDA and has been authorized for detection and/or diagnosis of SARS-CoV-2 by FDA under an Emergency Use Authorization (EUA).  This EUA will remain in effect (meaning this test can be used) for the duration of the COVID-19 declaration under Section 564(b)(1) of the Act, 21 U.S.C. section 360bbb-3(b)(1), unless the authorization is terminated or revoked sooner. Performed at [facility], [address] [place], [place] [address]    Ct Chest Wo Contrast  Result Date: 02/11/2019 CLINICAL DATA:  Hemoptysis. Shortness of breath. On dialysis. EXAM: CT CHEST WITHOUT CONTRAST TECHNIQUE: Multidetector CT imaging of the chest was performed following the standard protocol without IV contrast. COMPARISON:  Chest radiograph 02/11/2019 and CTA 11/15/2018 FINDINGS: Cardiovascular: Mild cardiomegaly. Decreased attenuation of the blood pool compatible with known anemia. Thoracic aortic atherosclerosis without aneurysm. Three-vessel coronary artery atherosclerosis. No pericardial effusion. Mediastinum/Nodes: No enlarged axillary lymph nodes. Small mediastinal lymph nodes, likely reactive. Limited assessment for hilar  lymphadenopathy in the absence of IV contrast. Unremarkable esophagus and thyroid. Lungs/Pleura: No pleural effusion or pneumothorax. Interlobular septal thickening and mild ground glass opacity relatively diffusely throughout both lungs, similar to the prior CT allowing for increased be basilar motion artifact  on the prior study. No consolidative opacity or mass. Mild peribronchial thickening. Upper Abdomen: Small volume ascites in the upper abdomen. Atrophic kidneys. Heavy atherosclerotic calcification. 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 Small Schmorl's nodes in the thoracic and lumbar spine. IMPRESSION: 1. Similar appearance of interlobular septal thickening and ground glass opacity bilaterally likely reflecting mild edema. 2. Small volume ascites in the upper abdomen. 3. Aortic Atherosclerosis (ICD10-I70.0). Electronically Signed   By: Logan Bores M.D.   On: 02/11/2019 16:45   Dg Chest Port 1 View  Result Date: 02/11/2019 CLINICAL DATA:  Coughing up blood.  Hemodialysis patient. EXAM: PORTABLE CHEST 1 VIEW COMPARISON:  Radiographs 05/17/2018 and 03/19/2018. FINDINGS: There is mild cardiomegaly with chronic vascular congestion. No edema, confluent airspace opacity, pleural effusion or pneumothorax. The bones appear unremarkable. IMPRESSION: Mild cardiomegaly with chronic vascular congestion. No edema or other acute findings. Electronically Signed   By: Richardean Sale M.D.   On: 02/11/2019 12:50    Pending Labs Unresulted Labs (From admission, onward)    Start     Ordered   02/11/19 1226  Blood culture (routine x 2)  BLOOD CULTURE X 2,   STAT     02/11/19 1226          Vitals/Pain Today's Vitals   02/11/19 1211 02/11/19 1216 02/11/19 1221  BP: (!) 153/76    Pulse: 100    Resp: 20    Temp: 98.8 F (37.1 C)    TempSrc: Oral    SpO2: (!) 89%    Weight:  70.3 kg   Height:  5\' 4"  (1.626 m)   PainSc:   0-No pain    Isolation  Precautions Droplet and Contact precautions  Medications Medications  insulin aspart (novoLOG) injection 5 Units (has no administration in time range)  dextrose 50 % solution 50 mL (has no administration in time range)  calcium gluconate 1 g/ 50 mL sodium chloride IVPB (has no administration in time range)  diphenhydrAMINE (BENADRYL) capsule 25 mg (25 mg Oral Given 02/11/19 1402)    Mobility walks Low fall risk   Focused Assessments Pulmonary Assessment Handoff:  Lung sounds:   O2 Device: Room Air        R Recommendations: See Admitting Provider Note  Report given to:   Additional Notes:

## 2019-02-12 LAB — BPAM RBC
Blood Product Expiration Date: 202006142359
Blood Product Expiration Date: 202006142359
ISSUE DATE / TIME: 202005222129
ISSUE DATE / TIME: 202005222229
Unit Type and Rh: 5100
Unit Type and Rh: 5100

## 2019-02-12 LAB — BASIC METABOLIC PANEL
Anion gap: 16 — ABNORMAL HIGH (ref 5–15)
BUN: 62 mg/dL — ABNORMAL HIGH (ref 6–20)
CO2: 27 mmol/L (ref 22–32)
Calcium: 8.9 mg/dL (ref 8.9–10.3)
Chloride: 99 mmol/L (ref 98–111)
Creatinine, Ser: 11.44 mg/dL — ABNORMAL HIGH (ref 0.61–1.24)
GFR calc Af Amer: 5 mL/min — ABNORMAL LOW (ref >60)
GFR calc non Af Amer: 5 mL/min — ABNORMAL LOW (ref >60)
Glucose, Bld: 118 mg/dL — ABNORMAL HIGH (ref 70–99)
Potassium: 4.3 mmol/L (ref 3.5–5.1)
Sodium: 142 mmol/L (ref 135–145)

## 2019-02-12 LAB — TYPE AND SCREEN
ABO/RH(D): O NEG
Antibody Screen: NEGATIVE
Unit division: 0
Unit division: 0

## 2019-02-12 LAB — PHOSPHORUS: Phosphorus: 6.1 mg/dL — ABNORMAL HIGH (ref 2.5–4.6)

## 2019-02-12 LAB — CBC
HCT: 25.5 % — ABNORMAL LOW (ref 39.0–52.0)
Hemoglobin: 8.4 g/dL — ABNORMAL LOW (ref 13.0–17.0)
MCH: 33.1 pg (ref 26.0–34.0)
MCHC: 32.9 g/dL (ref 30.0–36.0)
MCV: 100.4 fL — ABNORMAL HIGH (ref 80.0–100.0)
Platelets: 125 10*3/uL — ABNORMAL LOW (ref 150–400)
RBC: 2.54 MIL/uL — ABNORMAL LOW (ref 4.22–5.81)
RDW: 19.2 % — ABNORMAL HIGH (ref 11.5–15.5)
WBC: 8.5 10*3/uL (ref 4.0–10.5)
nRBC: 0.2 % (ref 0.0–0.2)

## 2019-02-12 LAB — MAGNESIUM: Magnesium: 2.1 mg/dL (ref 1.7–2.4)

## 2019-02-12 MED ORDER — CALCIUM ACETATE (PHOS BINDER) 667 MG PO CAPS
1334.0000 mg | ORAL_CAPSULE | Freq: Three times a day (TID) | ORAL | Status: DC
  Administered 2019-02-12: 1334 mg via ORAL
  Filled 2019-02-12 (×3): qty 2

## 2019-02-12 MED ORDER — AMLODIPINE BESYLATE 10 MG PO TABS: 10.0000 mg | ORAL_TABLET | Freq: Every day | ORAL | 1 refills | Status: DC

## 2019-02-12 MED ORDER — FAMOTIDINE IN NACL 20-0.9 MG/50ML-% IV SOLN
20.0000 mg | Freq: Once | INTRAVENOUS | Status: AC
  Administered 2019-02-12: 06:00:00 20 mg via INTRAVENOUS
  Filled 2019-02-12: qty 50

## 2019-02-12 MED ORDER — SODIUM CHLORIDE 0.9% FLUSH: 3.0000 mL | Freq: Two times a day (BID) | INTRAVENOUS | Status: DC

## 2019-02-12 MED ORDER — DIPHENHYDRAMINE HCL 50 MG/ML IJ SOLN
25.0000 mg | Freq: Once | INTRAMUSCULAR | Status: AC
  Administered 2019-02-12: 25 mg via INTRAVENOUS
  Filled 2019-02-12: qty 1

## 2019-02-12 MED ORDER — CALCIUM ACETATE (PHOS BINDER) 667 MG PO CAPS: 1334.0000 mg | ORAL_CAPSULE | Freq: Three times a day (TID) | ORAL | 2 refills | Status: DC

## 2019-02-12 MED ORDER — DIPHENHYDRAMINE HCL 25 MG PO CAPS
25.0000 mg | ORAL_CAPSULE | Freq: Once | ORAL | Status: AC
  Administered 2019-02-12: 25 mg via ORAL
  Filled 2019-02-12: qty 1

## 2019-02-12 MED ORDER — AMLODIPINE BESYLATE 10 MG PO TABS
10.0000 mg | ORAL_TABLET | Freq: Every day | ORAL | Status: DC
  Administered 2019-02-12: 11:00:00 10 mg via ORAL
  Filled 2019-02-12: qty 1

## 2019-02-12 MED ORDER — HYDRALAZINE HCL 20 MG/ML IJ SOLN: 10.0000 mg | Freq: Four times a day (QID) | INTRAMUSCULAR | Status: DC | PRN

## 2019-02-12 NOTE — Progress Notes (Signed)
Pt. back in room from dialysis. Alert and oriented. No complaints voiced at this time. Will continue to monitor.

## 2019-02-12 NOTE — Progress Notes (Signed)
HD Tx completed, tolerated well, UF goal met, PT B/P improved pulse and HR improved with 2 units blood received during [facility]    02/11/19 2330  Vital Signs  Temp 98.6 F (37 C)  Temp Source Oral  Pulse Rate 89  Resp 15  BP (!) 171/105  During Hemodialysis Assessment  Blood Flow Rate (mL/min) 400 mL/min  Arterial Pressure (mmHg) -120 mmHg  Venous Pressure (mmHg) 230 mmHg  Transmembrane Pressure (mmHg) 60 mmHg  Ultrafiltration Rate (mL/min) 880 mL/min  Dialysate Flow Rate (mL/min) 600 ml/min  Conductivity: Machine  13.9  HD Safety Checks Performed Yes  KECN 70.6 KECN  Dialysis Fluid Bolus Normal Saline  Bolus Amount (mL) 250 mL  Intra-Hemodialysis Comments Tx completed;Tolerated well

## 2019-02-12 NOTE — Progress Notes (Addendum)
Went over discharge instructions with the patient including new medication and to call for his follow-up appointment, provide discharge paperwork. Discontinue peripheral IV and telemetry monitor. Talked to patient about the pharmacy listed in his discharge paper which is the [facility], he say that they are close now, the weekend and Monday since it is memorial day. Text page Dr. Posey Pronto, Sona if we can change the pharmacy to [facility] in [place], awaiting for MD to call me back.   Patient's ride is here I took his number and I will let him know once I hear back from Dr. Posey Pronto.   Dr. Posey Pronto page back and she say she will change the pharmacy. Tried to call patient's number was not working, so I called his brother instead and let him know about the change.

## 2019-02-12 NOTE — Progress Notes (Signed)
Post HD Assessment   02/11/19 2345  Neurological  Level of Consciousness Alert  Orientation Level Oriented X4  Respiratory  Respiratory Pattern Regular  Chest Assessment Chest expansion symmetrical  Bilateral Breath Sounds Clear;Diminished  Cough None  Cardiac  Pulse Regular  Heart Sounds S1, S2  ECG Monitor Yes  Cardiac Rhythm ST;NSR  Vascular  R Radial Pulse +2  L Radial Pulse +2  Edema Generalized;Facial  Generalized Edema +1  Psychosocial  Psychosocial (WDL) WDL

## 2019-02-12 NOTE — Progress Notes (Signed)
Post HD TX    02/11/19 2345  Vital Signs  Resp (!) 22  BP (!) 162/100  Post-Hemodialysis Assessment  Rinseback Volume (mL) 250 mL  KECN 70.6 V  Dialyzer Clearance Lightly streaked  Duration of HD Treatment -hour(s) 3 hour(s)  Hemodialysis Intake (mL) 500 mL  UF Total -Machine (mL) 2001 mL  Net UF (mL) 1501 mL  Tolerated HD Treatment Yes  AVG/AVF Arterial Site Held (minutes) 5 minutes  AVG/AVF Venous Site Held (minutes) 5 minutes  Fistula / Graft Left Upper arm Arteriovenous fistula  No Placement Date or Time found.   Placed prior to admission: Yes  Orientation: Left  Access Location: Upper arm  Access Type: Arteriovenous fistula  Site Condition No complications  Fistula / Graft Assessment Thrill;Bruit;Aneurism present  Status Deaccessed  Drainage Description None

## 2019-02-12 NOTE — Progress Notes (Signed)
[facility]  ROUNDING NOTE   Subjective:  Patient admitted yesterday with hemoptysis. He was found to be hyperkalemic and underwent hemodialysis yesterday. Feeling much better today.   Objective:  Vital signs in last 24 hours:  Temp:  [97.9 F (36.6 C)-99.3 F (37.4 C)] 97.9 F (36.6 C) (05/23 0814) Pulse Rate:  [89-110] 104 (05/23 0815) Resp:  [15-30] 18 (05/23 0814) BP: (142-182)/(78-113) 169/113 (05/23 0814) SpO2:  [91 %-100 %] 93 % (05/23 0815) Weight:  [70.3 kg] 70.3 kg (05/22 2030)  Weight change:  Filed Weights   02/11/19 1216 02/11/19 2030  Weight: 70.3 kg 70.3 kg    Intake/Output: I/O last 3 completed shifts: In: 1390 [Blood:1390] Out: 1503 [Urine:1; Other:1501; Stool:1]   Intake/Output this shift:  Total I/O In: 360 [P.O.:360] Out: -   Physical Exam: General: No acute distress  Head: Normocephalic, atraumatic. Moist oral mucosal membranes  Eyes: Anicteric  Neck: Supple, trachea midline  Lungs:  Clear to auscultation, normal effort  Heart: S1S2 no rubs  Abdomen:  Soft, nontender, bowel sounds present  Extremities:  peripheral edema.  Neurologic: Awake, alert, following commands  Skin: No lesions  Access:  eft upper extremity AV fistula    Basic Metabolic Panel: Recent Labs  Lab 02/11/19 1354 02/12/19 0545  NA 140 142  K 6.3* 4.3  CL 96* 99  CO2 22 27  GLUCOSE 87 118*  BUN 98* 62*  CREATININE 15.76* 11.44*  CALCIUM 9.0 8.9  MG  --  2.1  PHOS  --  6.1*    Liver Function Tests: Recent Labs  Lab 02/11/19 1354  AST 104*  ALT 98*  ALKPHOS 65  BILITOT 1.4*  PROT 6.5  ALBUMIN 3.5   No results for input(s): LIPASE, AMYLASE in the last 168 hours. No results for input(s): AMMONIA in the last 168 hours.  CBC: Recent Labs  Lab 02/11/19 1354 02/12/19 0545  WBC 10.4 8.5  NEUTROABS 8.4*  --   HGB 6.4* 8.4*  HCT 20.4* 25.5*  MCV 109.1* 100.4*  PLT 155 125*    Cardiac Enzymes: Recent Labs  Lab 02/11/19 1354   TROPONINI 0.07*    BNP: Invalid input(s): POCBNP  CBG: No results for input(s): GLUCAP in the last 168 hours.  Microbiology: Results for orders placed or performed during the hospital encounter of 02/11/19  Blood culture (routine x 2)     Status: None (Preliminary result)   Collection Time: 02/11/19  1:48 PM  Result Value Ref Range Status   Specimen Description BLOOD  Final   Special Requests   Final    BOTTLES DRAWN AEROBIC AND ANAEROBIC Blood Culture results may not be optimal due to an inadequate volume of blood received in culture bottles   Culture   Final    NO GROWTH < 24 HOURS Performed at [facility], [address], [place], [place] [address]    Report Status PENDING  Incomplete  Blood culture (routine x 2)     Status: None (Preliminary result)   Collection Time: 02/11/19  1:54 PM  Result Value Ref Range Status   Specimen Description BLOOD  Final   Special Requests   Final    BOTTLES DRAWN AEROBIC AND ANAEROBIC Blood Culture results may not be optimal due to an inadequate volume of blood received in culture bottles   Culture   Final    NO GROWTH < 24 HOURS Performed at [facility], [address], [place], [place] [address]  Report Status PENDING  Incomplete  SARS Coronavirus 2 (CEPHEID- Performed in [place] hospital lab), Hosp Order     Status: None   Collection Time: 02/11/19  2:00 PM  Result Value Ref Range Status   SARS Coronavirus 2 NEGATIVE NEGATIVE Final    Comment: (NOTE) If result is NEGATIVE SARS-CoV-2 target nucleic acids are NOT DETECTED. The SARS-CoV-2 RNA is generally detectable in upper and lower  respiratory specimens during the acute phase of infection. The lowest  concentration of SARS-CoV-2 viral copies this assay can detect is 250  copies / mL. A negative result does not preclude SARS-CoV-2 infection  and should not be used as the sole basis for treatment or other  patient management decisions.  A negative result  may occur with  improper specimen collection / handling, submission of specimen other  than nasopharyngeal swab, presence of viral mutation(s) within the  areas targeted by this assay, and inadequate number of viral copies  (<250 copies / mL). A negative result must be combined with clinical  observations, patient history, and epidemiological information. If result is POSITIVE SARS-CoV-2 target nucleic acids are DETECTED. The SARS-CoV-2 RNA is generally detectable in upper and lower  respiratory specimens dur ing the acute phase of infection.  Positive  results are indicative of active infection with SARS-CoV-2.  Clinical  correlation with patient history and other diagnostic information is  necessary to determine patient infection status.  Positive results do  not rule out bacterial infection or co-infection with other viruses. If result is PRESUMPTIVE POSTIVE SARS-CoV-2 nucleic acids MAY BE PRESENT.   A presumptive positive result was obtained on the submitted specimen  and confirmed on repeat testing.  While 2019 novel coronavirus  (SARS-CoV-2) nucleic acids may be present in the submitted sample  additional confirmatory testing may be necessary for epidemiological  and / or clinical management purposes  to differentiate between  SARS-CoV-2 and other Sarbecovirus currently known to infect humans.  If clinically indicated additional testing with an alternate test  methodology [phone]) is advised. The SARS-CoV-2 RNA is generally  detectable in upper and lower respiratory sp ecimens during the acute  phase of infection. The expected result is Negative. Fact Sheet for Patients:  StrictlyIdeas.no Fact Sheet for Healthcare Providers: BankingDealers.co.za This test is not yet approved or cleared by the [place] FDA and has been authorized for detection and/or diagnosis of SARS-CoV-2 by FDA under an Emergency Use Authorization (EUA).   This EUA will remain in effect (meaning this test can be used) for the duration of the COVID-19 declaration under Section 564(b)(1) of the Act, 21 U.S.C. section 360bbb-3(b)(1), unless the authorization is terminated or revoked sooner. Performed at [facility], [address] [place], [place] [address]   MRSA PCR Screening     Status: None   Collection Time: 02/11/19  6:48 PM  Result Value Ref Range Status   MRSA by PCR NEGATIVE NEGATIVE Final    Comment:        The GeneXpert MRSA Assay (FDA approved for NASAL specimens only), is one component of a comprehensive MRSA colonization surveillance program. It is not intended to diagnose MRSA infection nor to guide or monitor treatment for MRSA infections. Performed at [facility], [address] [place], [place] [address]     Coagulation Studies: Recent Labs    02/11/19 1354  LABPROT 16.7*  INR 1.4*    Urinalysis: No results for input(s): COLORURINE, LABSPEC, PHURINE, GLUCOSEU, HGBUR, BILIRUBINUR, KETONESUR, PROTEINUR, UROBILINOGEN, NITRITE,  LEUKOCYTESUR in the last 72 hours.  Invalid input(s): APPERANCEUR    Imaging: Ct Chest Wo Contrast  Result Date: 02/11/2019 CLINICAL DATA:  Hemoptysis. Shortness of breath. On dialysis. EXAM: CT CHEST WITHOUT CONTRAST TECHNIQUE: Multidetector CT imaging of the chest was performed following the standard protocol without IV contrast. COMPARISON:  Chest radiograph 02/11/2019 and CTA 11/15/2018 FINDINGS: Cardiovascular: Mild cardiomegaly. Decreased attenuation of the blood pool compatible with known anemia. Thoracic aortic atherosclerosis without aneurysm. Three-vessel coronary artery atherosclerosis. No pericardial effusion. Mediastinum/Nodes: No enlarged axillary lymph nodes. Small mediastinal lymph nodes, likely reactive. Limited assessment for hilar lymphadenopathy in the absence of IV contrast. Unremarkable esophagus and thyroid. Lungs/Pleura: No pleural effusion or  pneumothorax. Interlobular septal thickening and mild ground glass opacity relatively diffusely throughout both lungs, similar to the prior CT allowing for increased be basilar motion artifact on the prior study. No consolidative opacity or mass. Mild peribronchial thickening. Upper Abdomen: Small volume ascites in the upper abdomen. Atrophic kidneys. Heavy atherosclerotic calcification. 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 Small Schmorl's nodes in the thoracic and lumbar spine. IMPRESSION: 1. Similar appearance of interlobular septal thickening and ground glass opacity bilaterally likely reflecting mild edema. 2. Small volume ascites in the upper abdomen. 3. Aortic Atherosclerosis (ICD10-I70.0). Electronically Signed   By: Logan Bores M.D.   On: 02/11/2019 16:45   Dg Chest Port 1 View  Result Date: 02/11/2019 CLINICAL DATA:  Coughing up blood.  Hemodialysis patient. EXAM: PORTABLE CHEST 1 VIEW COMPARISON:  Radiographs 05/17/2018 and 03/19/2018. FINDINGS: There is mild cardiomegaly with chronic vascular congestion. No edema, confluent airspace opacity, pleural effusion or pneumothorax. The bones appear unremarkable. IMPRESSION: Mild cardiomegaly with chronic vascular congestion. No edema or other acute findings. Electronically Signed   By: Richardean Sale M.D.   On: 02/11/2019 12:50     Medications:    . sodium chloride   Intravenous Once  . amLODipine  10 mg Oral Daily  . calcium acetate  1,334 mg Oral TID WC  . Chlorhexidine Gluconate Cloth  6 each Topical Q0600  . nicotine  14 mg Transdermal Daily  . sodium chloride flush  3 mL Intravenous Q12H   hydrALAZINE  Assessment/ Plan:  53 y.o. male with ESRD, hypertension, anemia of chronic kidney disease, secondary hyperparathyroidism who was admitted with hemoptysis.  [facility] [address]/MWF/[facility]  1.  ESRD on HD MWF.  Patient underwent dialysis yesterday.  No acute indication for  dialysis today.  We advised the patient to maintain his normal outpatient hemodialysis schedule Monday.  2.  Anemia of chronic kidney disease.  Hemoglobin improved to 8.4 post blood transfusion.  Continue Mircera as an outpatient.  3.  Secondary hyperparathyroidism.  Most recent phosphorus was 6.1.  Continue calcium acetate 2 tablets p.o. 3 times daily with meals and monitor bone mineral metabolism parameters as an outpatient.   LOS: 1 Sabirin Baray 5/23/20201:59 PM

## 2019-02-12 NOTE — Discharge Summary (Addendum)
[place] [facility] [place] NAME: Jordan [name]    MR#:  [id number]  DATE OF BIRTH:  Jul 14, 1966  DATE OF ADMISSION:  02/11/2019 ADMITTING PHYSICIAN: Otila Back, MD  DATE OF DISCHARGE: 02/12/2019  PRIMARY CARE PHYSICIAN: Lorelee Market, MD    ADMISSION DIAGNOSIS:  Hyperkalemia [E87.5] Hemoptysis [R04.2] Anemia, unspecified type [D64.9]  DISCHARGE DIAGNOSIS:  Hyperkalemia-- resolved acute pulmonary edema which shortness of breath improved after dialysis any of chronic disease/end-stage renal disease-- status post  blood transfusion SECONDARY DIAGNOSIS:   Past Medical History:  Diagnosis Date  . Anemia   . CKD (chronic kidney disease)    Stage 5  Dialysis - M/W/F  . ETOH abuse   . Hypertension     HOSPITAL COURSE:  53 year old African-American male with history of end-stage renal disease on hemodialysis on Mondays Wednesdays and Fridays and hypertension who presented with hemoptysis.  Found to have hemoglobin of 6.4 and potassium of 6.3.  Being admitted for further management.  1.  Hemoptysis suspected due to Pulmonary edema as noted on CT chest Described as mild.  Teaspoon sized.  No further hemoptysis since last night.  No chest pain.  No fevers. CT chest done with findings consistent with mild edema. Avoid all antiplatelet agents or anticoagulants. No more coughing sats stable on RA patient improved quicker than expected.  2.  Acute on chronic anemia in the setting of ESRD -hemoglobin of 6.4--BT--8.4 Secondary to hemoptysis. Superimposed on anemia of chronic disease.    3.  End-stage renal disease on hemodialysis. Status post emergent hemodialysis with ultrafiltration removal of 1500 mL -continue phoslo and renvela  4.  Hyperkalemia with potassium of 6.3 Being treated medically in the emergency room.  -K down to 4.3  5.  Tobacco abuse Smoking cessation counseling done.  Patient motivated tostop  Placed on  nicotine patch.  6. HTN--norvasc  Overall at baseline. D/w nephrology. No indicaiton for HD today. Patient will resume his hemodialysis per routine from Monday.    CONSULTS OBTAINED:  Treatment Team:  Anthonette Legato, MD  DRUG ALLERGIES:  No Known Allergies  DISCHARGE MEDICATIONS:   Allergies as of 02/12/2019   No Known Allergies     Medication List    TAKE these medications   albuterol 108 (90 Base) MCG/ACT inhaler Commonly known as:  VENTOLIN HFA Inhale 2 puffs into the lungs every 4 (four) hours as needed for wheezing or shortness of breath.   amLODipine 10 MG tablet Commonly known as:  NORVASC Take 1 tablet (10 mg total) by mouth daily. Start taking on:  Feb 13, 2019   calcium acetate 667 MG capsule Commonly known as:  PHOSLO Take 2 capsules (1,334 mg total) by mouth 3 (three) times daily with meals.   Renvela 800 MG tablet Generic drug:  sevelamer carbonate Take 2,400 mg by mouth 3 (three) times daily with meals.       If you experience worsening of your admission symptoms, develop shortness of breath, life threatening emergency, suicidal or homicidal thoughts you must seek medical attention immediately by calling 911 or calling your MD immediately  if symptoms less severe.  You Must read complete instructions/literature along with all the possible adverse reactions/side effects for all the Medicines you take and that have been prescribed to you. Take any new Medicines after you have completely understood and accept all the possible adverse reactions/side effects.   Please note  You were cared for by a hospitalist during your hospital  stay. If you have any questions about your discharge medications or the care you received while you were in the hospital after you are discharged, you can call the unit and asked to speak with the hospitalist on call if the hospitalist that took care of you is not available. Once you are discharged, your primary care physician  will handle any further medical issues. Please note that NO REFILLS for any discharge medications will be authorized once you are discharged, as it is imperative that you return to your primary care physician (or establish a relationship with a primary care physician if you do not have one) for your aftercare needs so that they can reassess your need for medications and monitor your lab values. Today   SUBJECTIVE   No new compalints  VITAL SIGNS:  Blood pressure (!) 169/113, pulse (!) 104, temperature 97.9 F (36.6 C), resp. rate 18, height 5\' 4"  (1.626 m), weight 70.3 kg, SpO2 93 %.  I/O:    Intake/Output Summary (Last 24 hours) at 02/12/2019 1352 Last data filed at 02/12/2019 1350 Gross per 24 hour  Intake 1750 ml  Output 1503 ml  Net 247 ml    PHYSICAL EXAMINATION:  GENERAL:  53 y.o.-year-old patient lying in the bed with no acute distress. Chronically ill EYES: Pupils equal, round, reactive to light and accommodation. No scleral icterus. Extraocular muscles intact.  HEENT: Head atraumatic, normocephalic. Oropharynx and nasopharynx clear.  NECK:  Supple, no jugular venous distention. No thyroid enlargement, no tenderness.  LUNGS: Normal breath sounds bilaterally, no wheezing, rales,rhonchi or crepitation. No use of accessory muscles of respiration.  CARDIOVASCULAR: S1, S2 normal. No murmurs, rubs, or gallops.  ABDOMEN: Soft, non-tender, non-distended. Bowel sounds present. No organomegaly or mass.  EXTREMITIES: No pedal edema, cyanosis, or clubbing. HD fistula/graft UE NEUROLOGIC: Cranial nerves II through XII are intact. Muscle strength 5/5 in all extremities. Sensation intact. Gait not checked.  PSYCHIATRIC: The patient is alert and oriented x 3.  SKIN: No obvious rash, lesion, or ulcer.   DATA REVIEW:   CBC  Recent Labs  Lab 02/12/19 0545  WBC 8.5  HGB 8.4*  HCT 25.5*  PLT 125*    Chemistries  Recent Labs  Lab 02/11/19 1354 02/12/19 0545  NA 140 142  K 6.3*  4.3  CL 96* 99  CO2 22 27  GLUCOSE 87 118*  BUN 98* 62*  CREATININE 15.76* 11.44*  CALCIUM 9.0 8.9  MG  --  2.1  AST 104*  --   ALT 98*  --   ALKPHOS 65  --   BILITOT 1.4*  --     Microbiology Results   Recent Results (from the past 240 hour(s))  Blood culture (routine x 2)     Status: None (Preliminary result)   Collection Time: 02/11/19  1:48 PM  Result Value Ref Range Status   Specimen Description BLOOD  Final   Special Requests   Final    BOTTLES DRAWN AEROBIC AND ANAEROBIC Blood Culture results may not be optimal due to an inadequate volume of blood received in culture bottles   Culture   Final    NO GROWTH < 24 HOURS Performed at [facility], [address] [place], [place] [address]    Report Status PENDING  Incomplete  Blood culture (routine x 2)     Status: None (Preliminary result)   Collection Time: 02/11/19  1:54 PM  Result Value Ref Range Status   Specimen Description BLOOD  Final  Special Requests   Final    BOTTLES DRAWN AEROBIC AND ANAEROBIC Blood Culture results may not be optimal due to an inadequate volume of blood received in culture bottles   Culture   Final    NO GROWTH < 24 HOURS Performed at [facility], [address] [place], [place] [address]    Report Status PENDING  Incomplete  SARS Coronavirus 2 (CEPHEID- Performed in [place] hospital lab), Hosp Order     Status: None   Collection Time: 02/11/19  2:00 PM  Result Value Ref Range Status   SARS Coronavirus 2 NEGATIVE NEGATIVE Final    Comment: (NOTE) If result is NEGATIVE SARS-CoV-2 target nucleic acids are NOT DETECTED. The SARS-CoV-2 RNA is generally detectable in upper and lower  respiratory specimens during the acute phase of infection. The lowest  concentration of SARS-CoV-2 viral copies this assay can detect is 250  copies / mL. A negative result does not preclude SARS-CoV-2 infection  and should not be used as the sole basis for treatment or other   patient management decisions.  A negative result may occur with  improper specimen collection / handling, submission of specimen other  than nasopharyngeal swab, presence of viral mutation(s) within the  areas targeted by this assay, and inadequate number of viral copies  (<250 copies / mL). A negative result must be combined with clinical  observations, patient history, and epidemiological information. If result is POSITIVE SARS-CoV-2 target nucleic acids are DETECTED. The SARS-CoV-2 RNA is generally detectable in upper and lower  respiratory specimens dur ing the acute phase of infection.  Positive  results are indicative of active infection with SARS-CoV-2.  Clinical  correlation with patient history and other diagnostic information is  necessary to determine patient infection status.  Positive results do  not rule out bacterial infection or co-infection with other viruses. If result is PRESUMPTIVE POSTIVE SARS-CoV-2 nucleic acids MAY BE PRESENT.   A presumptive positive result was obtained on the submitted specimen  and confirmed on repeat testing.  While 2019 novel coronavirus  (SARS-CoV-2) nucleic acids may be present in the submitted sample  additional confirmatory testing may be necessary for epidemiological  and / or clinical management purposes  to differentiate between  SARS-CoV-2 and other Sarbecovirus currently known to infect humans.  If clinically indicated additional testing with an alternate test  methodology [phone]) is advised. The SARS-CoV-2 RNA is generally  detectable in upper and lower respiratory sp ecimens during the acute  phase of infection. The expected result is Negative. Fact Sheet for Patients:  StrictlyIdeas.no Fact Sheet for Healthcare Providers: BankingDealers.co.za This test is not yet approved or cleared by the [place] FDA and has been authorized for detection and/or diagnosis of SARS-CoV-2  by FDA under an Emergency Use Authorization (EUA).  This EUA will remain in effect (meaning this test can be used) for the duration of the COVID-19 declaration under Section 564(b)(1) of the Act, 21 U.S.C. section 360bbb-3(b)(1), unless the authorization is terminated or revoked sooner. Performed at [facility], [address] [place], [place] [address]   MRSA PCR Screening     Status: None   Collection Time: 02/11/19  6:48 PM  Result Value Ref Range Status   MRSA by PCR NEGATIVE NEGATIVE Final    Comment:        The GeneXpert MRSA Assay (FDA approved for NASAL specimens only), is one component of a comprehensive MRSA colonization surveillance program. It is not intended to diagnose  MRSA infection nor to guide or monitor treatment for MRSA infections. Performed at [facility], [address], [place], [place] [address]     RADIOLOGY:  Ct Chest Wo Contrast  Result Date: 02/11/2019 CLINICAL DATA:  Hemoptysis. Shortness of breath. On dialysis. EXAM: CT CHEST WITHOUT CONTRAST TECHNIQUE: Multidetector CT imaging of the chest was performed following the standard protocol without IV contrast. COMPARISON:  Chest radiograph 02/11/2019 and CTA 11/15/2018 FINDINGS: Cardiovascular: Mild cardiomegaly. Decreased attenuation of the blood pool compatible with known anemia. Thoracic aortic atherosclerosis without aneurysm. Three-vessel coronary artery atherosclerosis. No pericardial effusion. Mediastinum/Nodes: No enlarged axillary lymph nodes. Small mediastinal lymph nodes, likely reactive. Limited assessment for hilar lymphadenopathy in the absence of IV contrast. Unremarkable esophagus and thyroid. Lungs/Pleura: No pleural effusion or pneumothorax. Interlobular septal thickening and mild ground glass opacity relatively diffusely throughout both lungs, similar to the prior CT allowing for increased be basilar motion artifact on the prior study. No consolidative opacity or mass.  Mild peribronchial thickening. Upper Abdomen: Small volume ascites in the upper abdomen. Atrophic kidneys. Heavy atherosclerotic calcification. 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 Small Schmorl's nodes in the thoracic and lumbar spine. IMPRESSION: 1. Similar appearance of interlobular septal thickening and ground glass opacity bilaterally likely reflecting mild edema. 2. Small volume ascites in the upper abdomen. 3. Aortic Atherosclerosis (ICD10-I70.0). Electronically Signed   By: Logan Bores M.D.   On: 02/11/2019 16:45   Dg Chest Port 1 View  Result Date: 02/11/2019 CLINICAL DATA:  Coughing up blood.  Hemodialysis patient. EXAM: PORTABLE CHEST 1 VIEW COMPARISON:  Radiographs 05/17/2018 and 03/19/2018. FINDINGS: There is mild cardiomegaly with chronic vascular congestion. No edema, confluent airspace opacity, pleural effusion or pneumothorax. The bones appear unremarkable. IMPRESSION: Mild cardiomegaly with chronic vascular congestion. No edema or other acute findings. Electronically Signed   By: Richardean Sale M.D.   On: 02/11/2019 12:50     CODE STATUS:     Code Status Orders  (From admission, onward)         Start     Ordered   02/11/19 1646  Full code  Continuous     02/11/19 1646        Code Status History    Date Active Date Inactive Code Status Order ID Comments User Context   11/30/2018 1011 12/03/2018 1959 Full Code [id number]  Otila Back, MD ED   07/18/2018 2330 07/19/2018 1908 Full Code [id number]  Elwyn Reach, MD ED   05/17/2018 0321 05/17/2018 2113 Full Code [id number]  Ivor Costa, MD ED   03/19/2018 1336 03/20/2018 0003 Full Code [id number]  Neva Seat, MD ED   08/04/2016 0807 08/12/2016 2323 Full Code [id number]  Shawn Route, Coralee Pesa, PA-C ED      TOTAL TIME TAKING CARE OF THIS PATIENT: *40* minutes.    Fritzi Mandes M.D on 02/12/2019 at 1:52 PM  Between 7am to 6pm - Pager - [phone] After 6pm go to  [email or web address] - password [email or web address] [place] [facility]  Office  [phone]  CC: Primary care physician; Lorelee Market, MD

## 2019-02-12 NOTE — Progress Notes (Signed)
Notified Gardiner Barefoot, NP, about pt. with c/o generalized itching that has reoccurred since prior administration of Benadryl. Pt. Previously stated that the itching is not a new occurrence.  Pt. States that he has chronic itching issues at home and usually takes a shower to relieve the discomfort or uses a cream. However, pt. recieved 2 units of PRBC's in dialysis last night.  No hives or rash noted upon assessment. Pt. denies chest pain or shortness of breath. Received a one time order for 20mg  IV pepcid and 25mg  IV benadryl. See MAR.  Will continue to monitor.

## 2019-02-12 NOTE — Discharge Instructions (Signed)
Resume your hemodialysis Monday Wednesday Friday as before Pt   advised smoking cessation

## 2019-02-16 LAB — CULTURE, BLOOD (ROUTINE X 2)
Culture: NO GROWTH
Culture: NO GROWTH

## 2019-02-18 ENCOUNTER — Encounter: Payer: Self-pay | Admitting: Emergency Medicine

## 2019-02-18 ENCOUNTER — Emergency Department
Admission: EM | Admit: 2019-02-18 | Discharge: 2019-02-18 | Disposition: A | Discharge status: Home / Self Care | Payer: Medicare Other | Attending: Emergency Medicine | Admitting: Emergency Medicine

## 2019-02-18 ENCOUNTER — Other Ambulatory Visit: Payer: Self-pay

## 2019-02-18 ENCOUNTER — Emergency Department: Payer: Medicare Other

## 2019-02-18 DIAGNOSIS — F17210 Nicotine dependence, cigarettes, uncomplicated: Secondary | ICD-10-CM | POA: Insufficient documentation

## 2019-02-18 DIAGNOSIS — R042 Hemoptysis: Secondary | ICD-10-CM | POA: Diagnosis not present

## 2019-02-18 DIAGNOSIS — I120 Hypertensive chronic kidney disease with stage 5 chronic kidney disease or end stage renal disease: Secondary | ICD-10-CM | POA: Diagnosis not present

## 2019-02-18 DIAGNOSIS — L299 Pruritus, unspecified: Secondary | ICD-10-CM | POA: Diagnosis present

## 2019-02-18 DIAGNOSIS — Z79899 Other long term (current) drug therapy: Secondary | ICD-10-CM | POA: Diagnosis not present

## 2019-02-18 DIAGNOSIS — Z1159 Encounter for screening for other viral diseases: Secondary | ICD-10-CM | POA: Diagnosis not present

## 2019-02-18 DIAGNOSIS — Z992 Dependence on renal dialysis: Secondary | ICD-10-CM | POA: Diagnosis not present

## 2019-02-18 DIAGNOSIS — N186 End stage renal disease: Secondary | ICD-10-CM | POA: Insufficient documentation

## 2019-02-18 DIAGNOSIS — J810 Acute pulmonary edema: Secondary | ICD-10-CM

## 2019-02-18 LAB — COMPREHENSIVE METABOLIC PANEL
ALT: 57 U/L — ABNORMAL HIGH (ref 0–44)
AST: 22 U/L (ref 15–41)
Albumin: 3.4 g/dL — ABNORMAL LOW (ref 3.5–5.0)
Alkaline Phosphatase: 67 U/L (ref 38–126)
Anion gap: 20 — ABNORMAL HIGH (ref 5–15)
BUN: 73 mg/dL — ABNORMAL HIGH (ref 6–20)
CO2: 22 mmol/L (ref 22–32)
Calcium: 9.1 mg/dL (ref 8.9–10.3)
Chloride: 99 mmol/L (ref 98–111)
Creatinine, Ser: 13 mg/dL — ABNORMAL HIGH (ref 0.61–1.24)
GFR calc Af Amer: 5 mL/min — ABNORMAL LOW (ref >60)
GFR calc non Af Amer: 4 mL/min — ABNORMAL LOW (ref >60)
Glucose, Bld: 79 mg/dL (ref 70–99)
Potassium: 5 mmol/L (ref 3.5–5.1)
Sodium: 141 mmol/L (ref 135–145)
Total Bilirubin: 1.4 mg/dL — ABNORMAL HIGH (ref 0.3–1.2)
Total Protein: 7 g/dL (ref 6.5–8.1)

## 2019-02-18 LAB — CBC WITH DIFFERENTIAL/PLATELET
Abs Immature Granulocytes: 0.14 10*3/uL — ABNORMAL HIGH (ref 0.00–0.07)
Basophils Absolute: 0.1 10*3/uL (ref 0.0–0.1)
Basophils Relative: 0 %
Eosinophils Absolute: 0.7 10*3/uL — ABNORMAL HIGH (ref 0.0–0.5)
Eosinophils Relative: 6 %
HCT: 24.3 % — ABNORMAL LOW (ref 39.0–52.0)
Hemoglobin: 7.6 g/dL — ABNORMAL LOW (ref 13.0–17.0)
Immature Granulocytes: 1 %
Lymphocytes Relative: 11 %
Lymphs Abs: 1.3 10*3/uL (ref 0.7–4.0)
MCH: 33.2 pg (ref 26.0–34.0)
MCHC: 31.3 g/dL (ref 30.0–36.0)
MCV: 106.1 fL — ABNORMAL HIGH (ref 80.0–100.0)
Monocytes Absolute: 0.8 10*3/uL (ref 0.1–1.0)
Monocytes Relative: 6 %
Neutro Abs: 9.4 10*3/uL — ABNORMAL HIGH (ref 1.7–7.7)
Neutrophils Relative %: 76 %
Platelets: 123 10*3/uL — ABNORMAL LOW (ref 150–400)
RBC: 2.29 MIL/uL — ABNORMAL LOW (ref 4.22–5.81)
RDW: 17.1 % — ABNORMAL HIGH (ref 11.5–15.5)
WBC: 12.5 10*3/uL — ABNORMAL HIGH (ref 4.0–10.5)
nRBC: 0 % (ref 0.0–0.2)

## 2019-02-18 LAB — LACTIC ACID, PLASMA: Lactic Acid, Venous: 1.4 mmol/L (ref 0.5–1.9)

## 2019-02-18 LAB — SARS CORONAVIRUS 2 BY RT PCR (HOSPITAL ORDER, PERFORMED IN ~~LOC~~ HOSPITAL LAB): SARS Coronavirus 2: NEGATIVE

## 2019-02-18 LAB — TROPONIN I: Troponin I: 0.08 ng/mL (ref <0.03)

## 2019-02-18 IMAGING — DX PORTABLE CHEST - 1 VIEW
1 series · 1 of 1 positions shown · non-contrast
Comparison: CT scan and radiographs [DATE].

CLINICAL DATA: Productive cough.

EXAM:
PORTABLE CHEST 1 VIEW

[chest ap]
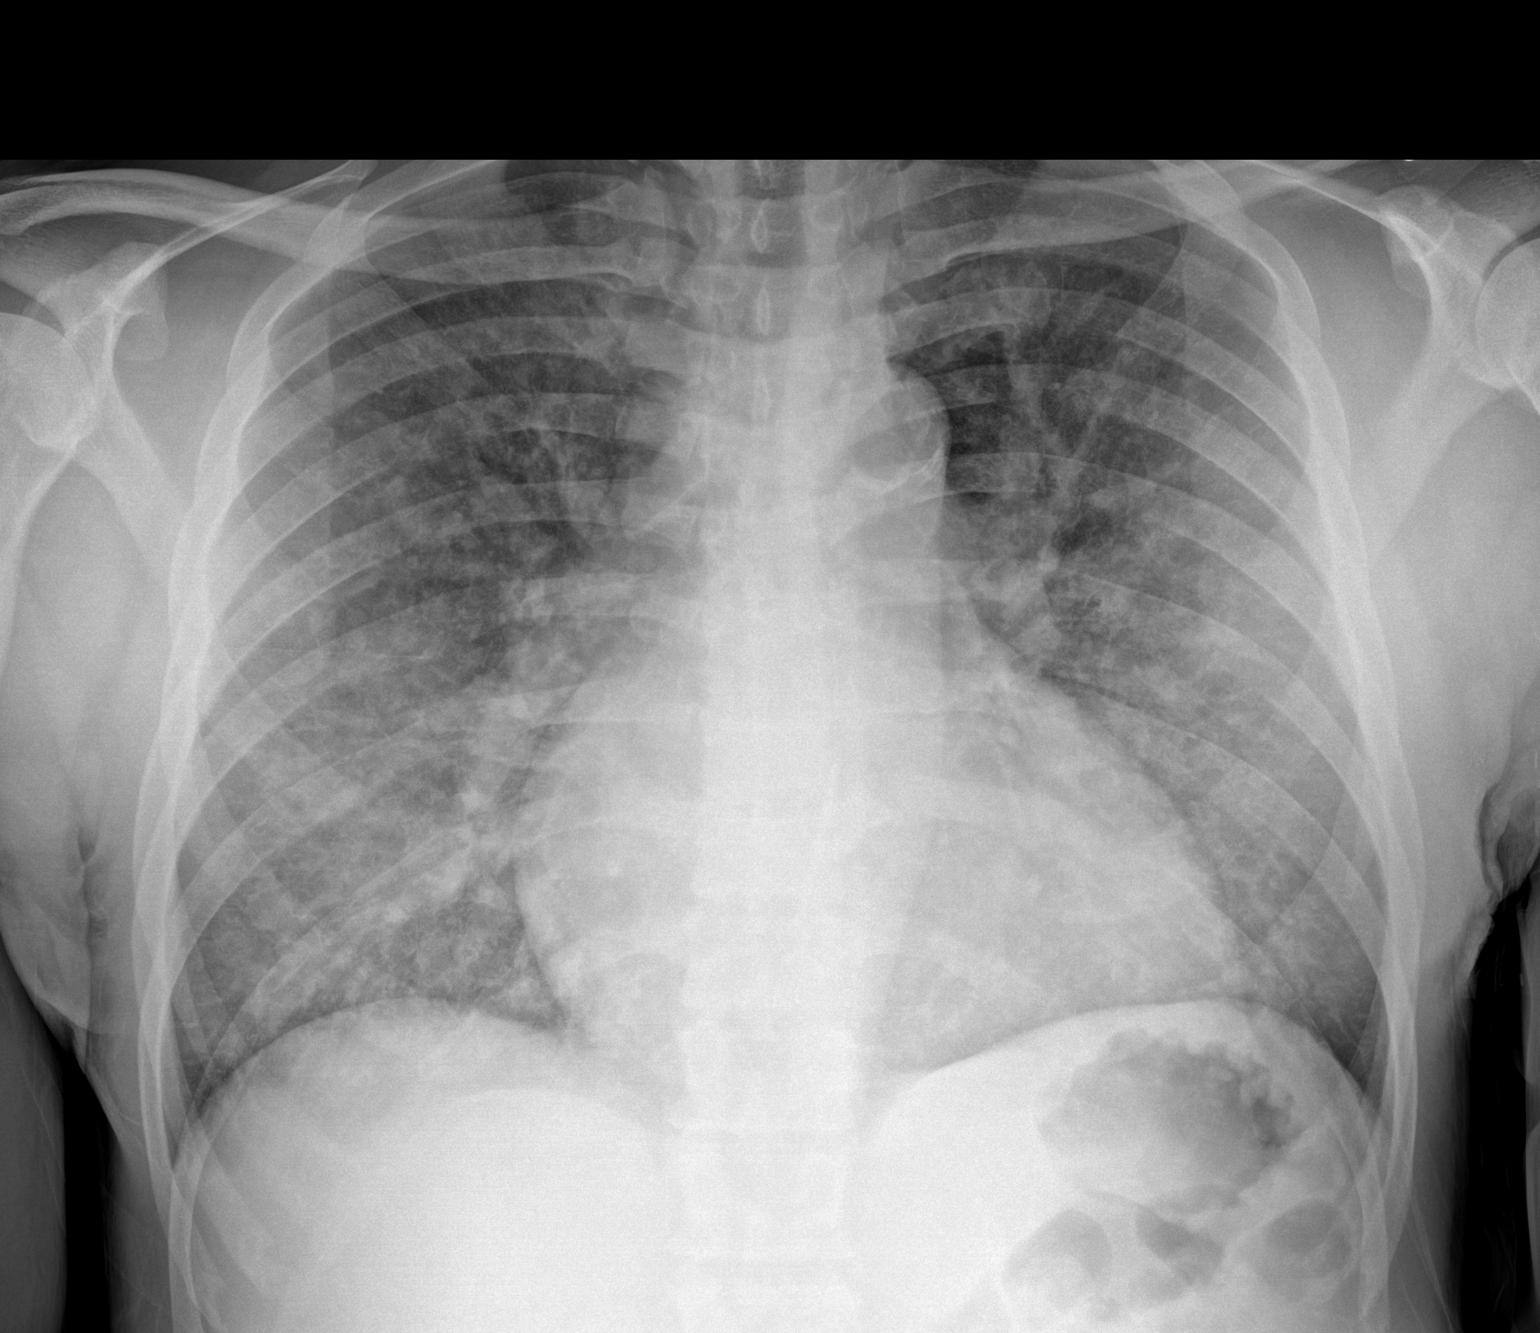

[1 of 1 positions shown; findings below may reference images not displayed]

FINDINGS: Stable cardiomegaly with central pulmonary vascular congestion. No
pneumothorax or pleural effusion is noted. Stable bilateral lung
opacities are noted which may represent some degree of pulmonary
edema. Bony thorax is unremarkable.
IMPRESSION: Stable cardiomegaly with central pulmonary vascular congestion and
possible bilateral pulmonary edema.

## 2019-02-18 MED ORDER — HYDROXYZINE HCL 25 MG PO TABS
25.0000 mg | ORAL_TABLET | Freq: Three times a day (TID) | ORAL | Status: DC | PRN
  Administered 2019-02-18: 25 mg via ORAL
  Filled 2019-02-18 (×2): qty 1

## 2019-02-18 MED ORDER — ONDANSETRON 4 MG PO TBDP
4.0000 mg | ORAL_TABLET | Freq: Once | ORAL | Status: AC
  Administered 2019-02-18: 4 mg via ORAL
  Filled 2019-02-18: qty 1

## 2019-02-18 NOTE — ED Provider Notes (Signed)
[facility] Emergency Department Provider Note  ____________________________________________  Time seen: Approximately 8:42 AM  I have reviewed the triage vital signs and the nursing notes.   HISTORY  Chief Complaint Pruritis   HPI Jordan [name] is a 53 y.o. male with h/o ESRD on HD (MWF), HTN, alcohol abuse who presents for evaluation of pruritus.  Patient seen inflexible transfer to C pod because of a cough.  His biggest complaint is diffuse pruritus which she has had for 2 days.  Last dialysis was 3 days ago.  His schedule was changed this week due to [facility] Day weekend.  He is scheduled to have dialysis today at 11 AM.  He is also complaining of a cough with intermittent blood-tinged sputum.  He was admitted 7 days ago for the same.  Had a CT that was negative for PE and positive for pulmonary edema.  Patient denies fever or chills, chest pain or shortness of breath, leg pain or swelling, any prior history of PE or DVTs.   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   History of macrocytic anemia 03/19/2018   History of iron deficiency anemia 03/19/2018   ESRD on dialysis ([place]) 05/26/2017   Essential hypertension 05/26/2017   Gastritis with hemorrhage 05/26/2017   Tobacco abuse 08/04/2016   ETOH abuse 08/04/2016   Uncontrolled hypertension 08/04/2016   Acute renal failure (ARF) ([place]) 08/04/2016   Symptomatic anemia 08/04/2016    Past Surgical History:  Procedure Laterality Date   BASCILIC VEIN TRANSPOSITION Left 08/07/2016    Procedure: LEFT BASILIC VEIN TRANSPOSITION;  Surgeon: Angelia Mould, MD;  Location: [place];  Service: Vascular;  Laterality: Left;   COLONOSCOPY N/A 08/12/2016   Procedure: COLONOSCOPY;  Surgeon: Otis Brace, MD;  Location: [place];  Service: Gastroenterology;  Laterality: N/A;   ESOPHAGOGASTRODUODENOSCOPY N/A 08/12/2016   Procedure: ESOPHAGOGASTRODUODENOSCOPY (EGD);  Surgeon: Otis Brace, MD;  Location: [place];  Service: Gastroenterology;  Laterality: N/A;   INSERTION OF DIALYSIS CATHETER N/A 08/07/2016   Procedure: INSERTION OF TUNNELED DIALYSIS CATHETER;  Surgeon: Angelia Mould, MD;  Location: [place];  Service: Vascular;  Laterality: N/A;   LIGATION OF ARTERIOVENOUS  FISTULA Left 09/12/2016   Procedure: BANDING OF LEFT  ARTERIOVENOUS  FISTULA;  Surgeon: Angelia Mould, MD;  Location: [place];  Service: Vascular;  Laterality: Left;   LOWER EXTREMITY INTERVENTION Right 12/02/2018   Procedure: LOWER EXTREMITY INTERVENTION;  Surgeon: Algernon Huxley, MD;  Location: [place] [facility];  Service: Cardiovascular;  Laterality: Right;   NO PAST SURGERIES      Prior to Admission medications   Medication Sig Start Date End Date Taking? Authorizing Provider  albuterol (PROVENTIL HFA;VENTOLIN HFA) 108 (90 Base) MCG/ACT inhaler Inhale 2 puffs into the lungs every 4 (four) hours as needed for wheezing or shortness of breath. 10/03/18   Orpah Greek, MD  amLODipine (NORVASC) 10 MG tablet Take 1 tablet (10 mg total) by mouth daily. 02/13/19   Fritzi Mandes, MD  calcium  acetate (PHOSLO) 667 MG capsule Take 2 capsules (1,334 mg total) by mouth 3 (three) times daily with meals. 02/12/19   Fritzi Mandes, MD  RENVELA 800 MG tablet Take 2,400 mg by mouth 3 (three) times daily with meals.  01/06/18   [provider]    Allergies Patient has no known allergies.  Family History  Problem Relation Age of Onset   Diabetes Mother    Kidney failure Mother      Kidney failure Brother    Post-traumatic stress disorder Neg Hx    Bladder Cancer Neg Hx    Kidney cancer Neg Hx     Social History Social History   Tobacco Use   Smoking status: Current Every Day Smoker    Packs/day: 0.05    Years: 15.00    Pack years: 0.75    Types: Cigarettes   Smokeless tobacco: Never Used   Tobacco comment: down to 1 a day  Substance Use Topics   Alcohol use: Yes    Alcohol/week: 21.0 standard drinks    Types: 21 Cans of beer per week    Comment: none since 08/04/16   Drug use: No    Review of Systems  Constitutional: Negative for fever. Eyes: Negative for visual changes. ENT: Negative for sore throat. Neck: No neck pain  Cardiovascular: Negative for chest pain. Respiratory: Negative for shortness of breath. + cough, hemoptysis Gastrointestinal: Negative for abdominal pain, vomiting or diarrhea. Genitourinary: Negative for dysuria. Musculoskeletal: Negative for back pain. Skin: Negative for rash. + pruritus Neurological: Negative for headaches, weakness or numbness. Psych: No SI or HI  ____________________________________________   PHYSICAL EXAM:  VITAL SIGNS: ED Triage Vitals [02/18/19 0618]  Enc Vitals Group     BP (!) 165/89     Pulse Rate (!) 103     Resp 18     Temp 97.8 F (36.6 C)     Temp Source Oral     SpO2 100 %     Weight 155 lb (70.3 kg)     Height 5\' 4"  (1.626 m)     Head Circumference      Peak Flow      Pain Score      Pain Loc      Pain Edu?      Excl. in [place]?     Constitutional: Alert and oriented. Well appearing and in no apparent distress. HEENT:      Head: Normocephalic and atraumatic.         Eyes: Conjunctivae are normal. Sclera is non-icteric.       Mouth/Throat: Mucous membranes are moist.       Neck: Supple with no signs of meningismus. Cardiovascular: Tachycardic with regular rhythm. No murmurs, gallops, or rubs. 2+ symmetrical distal pulses are present in all extremities. No  JVD. Respiratory: Normal respiratory effort. Lungs are clear to auscultation bilaterally. No wheezes, crackles, or rhonchi.  Gastrointestinal: Soft, non tender, and non distended with positive bowel sounds. No rebound or guarding. Musculoskeletal: Nontender with normal range of motion in all extremities. No edema, cyanosis, or erythema of extremities. Neurologic: Normal speech and language. Face is symmetric. Moving all extremities. No gross focal neurologic deficits are appreciated. Skin: Skin is warm, dry and intact. No rash noted. Psychiatric: Mood and affect are normal. Speech and behavior are normal.  ____________________________________________   LABS (all labs ordered are listed, but only abnormal results are displayed)  Labs Reviewed  CBC WITH DIFFERENTIAL/PLATELET - Abnormal; Notable for the following components:  Result Value   WBC 12.5 (*)    RBC 2.29 (*)    Hemoglobin 7.6 (*)    HCT 24.3 (*)    MCV 106.1 (*)    RDW 17.1 (*)    Platelets 123 (*)    Neutro Abs 9.4 (*)    Eosinophils Absolute 0.7 (*)    Abs Immature Granulocytes 0.14 (*)    All other components within normal limits  COMPREHENSIVE METABOLIC PANEL - Abnormal; Notable for the following components:   BUN 73 (*)    Creatinine, Ser 13.00 (*)    Albumin 3.4 (*)    ALT 57 (*)    Total Bilirubin 1.4 (*)    GFR calc non Af Amer 4 (*)    GFR calc Af Amer 5 (*)    Anion gap 20 (*)    All other components within normal limits  TROPONIN I - Abnormal; Notable for the following components:   Troponin I 0.08 (*)    All other components within normal limits  SARS CORONAVIRUS 2 (HOSPITAL ORDER, [place] [facility])  LACTIC ACID, PLASMA   ____________________________________________  EKG  ED ECG REPORT I, Rudene Re, the attending physician, personally viewed and interpreted this ECG.  Sinus tachycardia, rate of 108, normal intervals, normal axis, no ST elevations or depressions.   Otherwise normal EKG unchanged from prior. ____________________________________________  RADIOLOGY  I have personally reviewed the images performed during this visit and I agree with the Radiologist's read.   Interpretation by Radiologist:  Dg Chest Portable 1 View  Result Date: 02/18/2019 CLINICAL DATA:  Productive cough. EXAM: PORTABLE CHEST 1 VIEW COMPARISON:  CT scan and radiographs of Feb 11, 2019. FINDINGS: Stable cardiomegaly with central pulmonary vascular congestion. No pneumothorax or pleural effusion is noted. Stable bilateral lung opacities are noted which may represent some degree of pulmonary edema. Bony thorax is unremarkable. IMPRESSION: Stable cardiomegaly with central pulmonary vascular congestion and possible bilateral pulmonary edema. Electronically Signed   By: Marijo Conception M.D.   On: 02/18/2019 07:36     ____________________________________________   PROCEDURES  Procedure(s) performed: None Procedures Critical Care performed:  None ____________________________________________   INITIAL IMPRESSION / ASSESSMENT AND PLAN / ED COURSE   53 y.o. male with h/o ESRD on HD (MWF), HTN, alcohol abuse who presents for evaluation of pruritus.  Patient seen inflexible transfer to C pod because of a cough and hemoptysis. Patient with several prior presentations with hemoptysis including most recently 7 days ago with negative CT angiogram of the chest.  Every single presentation was due to pulmonary edema.  Chest x-ray is consistent with pulmonary edema most likely in the setting of only 1 dialysis treatment in the last 7 days instead of 2.  Patient is breathing comfortably on room air and satting 100%, there is no swelling or edema of his lower extremities.  His biggest complaint is pruritus which is due to his dialysis.  He was given hydroxyzine and flax before being transferred to [place]. His labs showed no evidence of hyperkalemia. Stable anemia. Stable mildly elevated troponin.  EKG with no ischemic changes. Low suspicion for infectious etiology. Covid swab repeated. Patient scheduled for HD this am at 11AM. Will provide him with cab voucher to transfer to HD once COVID swab is returned.   _________________________ 9:48 AM on 02/18/2019 -----------------------------------------  COVID negative.  Patient remains well-appearing with normal work of breathing, no chest pain or shortness of breath.  Voucher was provided for patient  to go to dialysis at 11 AM.  Will discharge at this time.  Discussed my standard return precautions with patient.    As part of my medical decision making, I reviewed the following data within the [facility] notes reviewed and incorporated, Labs reviewed , EKG interpreted , Old EKG reviewed, Old chart reviewed, Radiograph reviewed  Notes from prior ED visits and [place] Controlled Substance Database    Pertinent labs & imaging results that were available during my care of the patient were reviewed by me and considered in my medical decision making (see chart for details).    ____________________________________________   FINAL CLINICAL IMPRESSION(S) / ED DIAGNOSES  Final diagnoses:  Chronic pruritus  Hemoptysis  Acute pulmonary edema ([place])      NEW MEDICATIONS STARTED DURING THIS VISIT:  ED Discharge Orders    None       Note:  This document was prepared using Dragon voice recognition software and may include unintentional dictation errors.    Alfred Levins, [place], MD 02/18/19 [phone]

## 2019-02-18 NOTE — TOC Initial Note (Signed)
Present Transition of Care [facility]) - Initial/Assessment Note    Patient Details  Name: Jordan [name] MRN: [id number] Date of Birth: 11/06/1965  Transition of Care [facility]) CM/SW Contact:    Katrina Stack, RN Phone Number: 02/18/2019, 8:44 AM  Clinical Narrative:                  Patient with high risk score.  He has frequent presentations. Presents today with puritis for 2 days. He is chronic HD at [facility] M W F 11 AM. Due for HD today. Informed attending he is coughing up blood.  Patient had work up for this same symptom one week prior.  Informed attending.  It intended patient will discharge from the ED in time to receive his HD treatment today.  Patient denies transportation issues that affect his ability to get to pcp appointments. Patient uses [employer] for transportation.  Patient will be provided a cab voucher that will take him to his HD appointment.  CM notified [place] that patient will need his routine transportation home after his dialysis session     Patient Goals and CMS Choice        Expected Discharge Plan and Services                                                Prior Living Arrangements/Services                       Activities of Daily Living      Permission Sought/Granted                  Emotional Assessment              Admission diagnosis:  Itching, High blood pressure Patient Active Problem List   Diagnosis Date Noted  . Hemoptysis 02/11/2019  . Allergic reaction 11/30/2018  . Pulmonary edema 07/18/2018  . Anemia in ESRD (end-stage renal disease) ([place]) 05/17/2018  . Hepatitis B 03/19/2018  . Encephalopathy 03/19/2018  . 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 History of macrocytic anemia 03/19/2018  . History of iron deficiency anemia 03/19/2018  . ESRD on dialysis ([place]) 05/26/2017  . Essential hypertension 05/26/2017  . Gastritis with hemorrhage 05/26/2017  . Tobacco abuse 08/04/2016  . ETOH  abuse 08/04/2016  . Uncontrolled hypertension 08/04/2016  . Acute renal failure (ARF) ([place]) 08/04/2016  . Symptomatic anemia 08/04/2016   PCP:  Lorelee Market, MD Pharmacy:   [place], [address] [place] [address] Phone: [phone] Fax: [phone]     Social Determinants of Health (SDOH) Interventions    Readmission Risk Interventions No flowsheet data found.

## 2019-02-18 NOTE — ED Notes (Signed)
Date and time results received: 02/18/19 8:14 AM   Test: Troponin Critical Value: 0.08  Name of Provider Notified: S. Caryn Section, PA

## 2019-02-18 NOTE — ED Triage Notes (Signed)
Pt arrived via EMS from home where pt called out for c/o all over purities x2 days, worsening this AM. Pt reports he has taken 7, 25mg  Benadryl within the last 3-4 hours with no relief.

## 2019-02-18 NOTE — Discharge Instructions (Signed)
Make sure to go straight to dialysis to help with your cough and itching. Return to the ER if you have chest pain, shortness of breath, or fever.

## 2019-02-18 NOTE — ED Provider Notes (Signed)
[facility] Emergency Department Provider Note  ____________________________________________   First MD Initiated Contact with Patient 02/18/19 [phone]     (approximate)  I have reviewed the triage vital signs and the nursing notes.   HISTORY  Chief Complaint Pruritis    HPI Jordan [name] is a 53 y.o. male presents emergency department complaining of persistent itching for 2 days.  He also complains of productive cough with blood.  Denies fever or chills.  Last dialysis was on Tuesday.  He does have swelling to the face.  He denies any rash.  He tried Benadryl and moisturizing creams without any relief.    Past Medical History:  Diagnosis Date  . Anemia   . CKD (chronic kidney disease)    Stage 5  Dialysis - M/W/F  . ETOH abuse   . Hypertension     Patient Active Problem List   Diagnosis Date Noted  . Hemoptysis 02/11/2019  . Allergic reaction 11/30/2018  . Pulmonary edema 07/18/2018  . Anemia in ESRD (end-stage renal disease) ([place]) 05/17/2018  . Hepatitis B 03/19/2018  . Encephalopathy 03/19/2018  . 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 History of macrocytic anemia 03/19/2018  . History of iron deficiency anemia 03/19/2018  . ESRD on dialysis ([place]) 05/26/2017  . Essential hypertension 05/26/2017  . Gastritis with hemorrhage 05/26/2017  . Tobacco abuse 08/04/2016  . ETOH abuse 08/04/2016  . Uncontrolled hypertension 08/04/2016  . Acute renal failure (ARF) ([place]) 08/04/2016  . Symptomatic anemia 08/04/2016    Past Surgical History:  Procedure Laterality Date  . BASCILIC VEIN TRANSPOSITION Left 08/07/2016   Procedure: LEFT BASILIC VEIN TRANSPOSITION;  Surgeon: Angelia Mould, MD;  Location: [place];  Service: Vascular;  Laterality: Left;  . COLONOSCOPY N/A 08/12/2016   Procedure: COLONOSCOPY;  Surgeon: Otis Brace, MD;  Location: [place];  Service: Gastroenterology;  Laterality: N/A;  .  ESOPHAGOGASTRODUODENOSCOPY N/A 08/12/2016   Procedure: ESOPHAGOGASTRODUODENOSCOPY (EGD);  Surgeon: Otis Brace, MD;  Location: [place];  Service: Gastroenterology;  Laterality: N/A;  . INSERTION OF DIALYSIS CATHETER N/A 08/07/2016   Procedure: INSERTION OF TUNNELED DIALYSIS CATHETER;  Surgeon: Angelia Mould, MD;  Location: [place];  Service: Vascular;  Laterality: N/A;  . LIGATION OF ARTERIOVENOUS  FISTULA Left 09/12/2016   Procedure: BANDING OF LEFT  ARTERIOVENOUS  FISTULA;  Surgeon: Angelia Mould, MD;  Location: [place];  Service: Vascular;  Laterality: Left;  . LOWER EXTREMITY INTERVENTION Right 12/02/2018   Procedure: LOWER EXTREMITY INTERVENTION;  Surgeon: Algernon Huxley, MD;  Location: [place] [facility];  Service: Cardiovascular;  Laterality: Right;  . NO PAST SURGERIES      Prior to Admission medications   Medication Sig Start Date End Date Taking? Authorizing Provider  albuterol (PROVENTIL HFA;VENTOLIN HFA) 108 (90 Base) MCG/ACT inhaler Inhale 2 puffs into the lungs every 4 (four) hours as needed for wheezing or shortness of breath. 10/03/18   Orpah Greek, MD  amLODipine (NORVASC) 10 MG tablet Take 1 tablet (10 mg total) by mouth daily. 02/13/19   Fritzi Mandes, MD  calcium acetate (PHOSLO) 667 MG capsule Take 2 capsules (1,334 mg total) by mouth 3 (three) times daily with meals. 02/12/19   Fritzi Mandes, MD  RENVELA 800 MG tablet Take 2,400 mg by mouth 3 (three) times daily with meals.  01/06/18   [provider]    Allergies Patient has no known allergies.  Family History  Problem Relation Age of Onset  .  Diabetes Mother   . Kidney failure Mother   . Kidney failure Brother   . Post-traumatic stress disorder Neg Hx   . Bladder Cancer Neg Hx   . Kidney cancer Neg Hx     Social History Social History   Tobacco Use  . Smoking status: Current Every Day Smoker    Packs/day: 0.05    Years: 15.00    Pack years: 0.75    Types: Cigarettes  .  Smokeless tobacco: Never Used  . Tobacco comment: down to 1 a day  Substance Use Topics  . Alcohol use: Yes    Alcohol/week: 21.0 standard drinks    Types: 21 Cans of beer per week    Comment: none since 08/04/16  . Drug use: No    Review of Systems  Constitutional: No fever/chills Eyes: No visual changes. ENT: No sore throat. Respiratory: Productive cough with blood Genitourinary: Negative for dysuria. Musculoskeletal: Negative for back pain. Skin: Negative for rash.  Itching    ____________________________________________   PHYSICAL EXAM:  VITAL SIGNS: ED Triage Vitals [02/18/19 0618]  Enc Vitals Group     BP (!) 165/89     Pulse Rate (!) 103     Resp 18     Temp 97.8 F (36.6 C)     Temp Source Oral     SpO2 100 %     Weight 155 lb (70.3 kg)     Height _0  (1.626 m)     Head Circumference      Peak Flow      Pain Score      Pain Loc      Pain Edu?      Excl. in [place]?     Constitutional: Alert and oriented. Well appearing and in no acute distress.  Patient is scratching Eyes: Conjunctivae are normal.  Head: Atraumatic. Nose: No congestion/rhinnorhea. Mouth/Throat: Mucous membranes are moist.   Neck:  supple no lymphadenopathy noted Cardiovascular: Normal rate, regular rhythm. Heart sounds are normal Respiratory: Normal respiratory effort.  No retractions, lungs c t a  GU: deferred Musculoskeletal: FROM all extremities, warm and well perfused Neurologic:  Normal speech and language.  Skin:  Skin is warm, dry and intact. No rash noted. Psychiatric: Mood and affect are normal. Speech and behavior are normal.  ____________________________________________   LABS (all labs ordered are listed, but only abnormal results are displayed)  Labs Reviewed  CBC WITH DIFFERENTIAL/PLATELET - Abnormal; Notable for the following components:      Result Value   WBC 12.5 (*)    RBC 2.29 (*)    Hemoglobin 7.6 (*)    HCT 24.3 (*)    MCV 106.1 (*)    RDW 17.1 (*)     Platelets 123 (*)    Neutro Abs 9.4 (*)    Eosinophils Absolute 0.7 (*)    Abs Immature Granulocytes 0.14 (*)    All other components within normal limits  COMPREHENSIVE METABOLIC PANEL - Abnormal; Notable for the following components:   BUN 73 (*)    Creatinine, Ser 13.00 (*)    Albumin 3.4 (*)    ALT 57 (*)    Total Bilirubin 1.4 (*)    GFR calc non Af Amer 4 (*)    GFR calc Af Amer 5 (*)    Anion gap 20 (*)    All other components within normal limits  TROPONIN I - Abnormal; Notable for the following components:   Troponin I 0.08 (*)  All other components within normal limits  SARS CORONAVIRUS 2 (HOSPITAL ORDER, [place] [facility])  LACTIC ACID, PLASMA   ____________________________________________   ____________________________________________  RADIOLOGY  Chest x-ray shows pulmonary vascular congestion  ____________________________________________   PROCEDURES  Procedure(s) performed: Atarax 25 mg p.o.   Procedures    ____________________________________________   INITIAL IMPRESSION / ASSESSMENT AND PLAN / ED COURSE  Pertinent labs & imaging results that were available during my care of the patient were reviewed by me and considered in my medical decision making (see chart for details).   Patient is 53 year old dialysis patient complaining of itching.  Patient also has productive cough with blood.  He was seen a week ago for the productive cough.  COVID at that time was negative.  He denies any fever or chills.  Last dialysis was Tuesday.  Physical exam patient is constantly scratching.  Does have some periorbital edema.  Blood pressure and pulse are both elevated.  Remainder the exam is basically unremarkable.  Chest x-ray shows pulmonary vascular congestion with possible pulmonary edema  Labs ordered, CBC, met C, troponin, COVID  Discussed with Dr. Alfred Levins, pt moved to [place] was evaluated in Emergency Department  on 02/18/2019 for the symptoms described in the history of present illness. He was evaluated in the context of the global COVID-19 pandemic, which necessitated consideration that the patient might be at risk for infection with the SARS-CoV-2 virus that causes COVID-19. Institutional protocols and algorithms that pertain to the evaluation of patients at risk for COVID-19 are in a state of rapid change based on information released by regulatory bodies including the CDC and federal and state organizations. These policies and algorithms were followed during the patient's care in the ED.   As part of my medical decision making, I reviewed the following data within the [place] notes reviewed and incorporated, Labs review, Old chart reviewed, Evaluated by EM attending dr Alfred Levins, Notes from prior ED visits and [place] Controlled Substance Database  ____________________________________________   FINAL CLINICAL IMPRESSION(S) / ED DIAGNOSES  Final diagnoses:  Chronic pruritus  Hemoptysis  Acute pulmonary edema ([place])      NEW MEDICATIONS STARTED DURING THIS VISIT:  New Prescriptions   No medications on file     Note:  This document was prepared using Dragon voice recognition software and may include unintentional dictation errors.    Versie Starks, PA-C 02/18/19 [place], [place], MD 02/18/19 [phone]

## 2019-02-18 NOTE — ED Notes (Addendum)
See triage note.  Presents with persistent itching for about 2 days.  States he has tried showers and moisturizing creams  Last dialysis was Tuesday and is scheduled to go again today  Peri orbital edema noted  SOB with exertion noted   Cough has been prod

## 2019-02-18 NOTE — ED Notes (Signed)
Pt moved to room 32  Report to [facility]

## 2019-06-14 ENCOUNTER — Encounter: Payer: Self-pay | Admitting: Internal Medicine

## 2019-06-14 ENCOUNTER — Other Ambulatory Visit: Payer: Self-pay

## 2019-06-14 ENCOUNTER — Ambulatory Visit (INDEPENDENT_AMBULATORY_CARE_PROVIDER_SITE_OTHER): Payer: Medicare Other | Admitting: Internal Medicine

## 2019-06-14 VITALS — BP 120/68 | HR 100 | Temp 97.8°F | Wt 157.0 lb

## 2019-06-14 DIAGNOSIS — I10 Essential (primary) hypertension: Secondary | ICD-10-CM | POA: Diagnosis not present

## 2019-06-14 DIAGNOSIS — N186 End stage renal disease: Secondary | ICD-10-CM

## 2019-06-14 DIAGNOSIS — D631 Anemia in chronic kidney disease: Secondary | ICD-10-CM

## 2019-06-14 DIAGNOSIS — R2241 Localized swelling, mass and lump, right lower limb: Secondary | ICD-10-CM | POA: Diagnosis not present

## 2019-06-14 DIAGNOSIS — Z992 Dependence on renal dialysis: Secondary | ICD-10-CM

## 2019-06-14 DIAGNOSIS — Z716 Tobacco abuse counseling: Secondary | ICD-10-CM | POA: Diagnosis not present

## 2019-06-14 MED ORDER — CHANTIX STARTING MONTH PAK 0.5 MG X 11 & 1 MG X 42 PO TABS: ORAL_TABLET | ORAL | 0 refills | Status: DC

## 2019-06-14 NOTE — Assessment & Plan Note (Signed)
Continue dialysis He will continue to follow with nephrology

## 2019-06-14 NOTE — Assessment & Plan Note (Signed)
Controlled off meds Reinforced DASH diet and increase in aerobic exercise Will monitor

## 2019-06-14 NOTE — Assessment & Plan Note (Signed)
CBC reviewed He reports he gets iron with dialysis Will monitor

## 2019-06-14 NOTE — Patient Instructions (Signed)
Coping with Quitting Smoking  Quitting smoking is a physical and mental challenge. You will face cravings, withdrawal symptoms, and temptation. Before quitting, work with your health care provider to make a plan that can help you cope. Preparation can help you quit and keep you from giving in. How can I cope with cravings? Cravings usually last for 5-10 minutes. If you get through it, the craving will pass. Consider taking the following actions to help you cope with cravings:  Keep your mouth busy: ? Chew sugar-free gum. ? Suck on hard candies or a straw. ? Brush your teeth.  Keep your hands and body busy: ? Immediately change to a different activity when you feel a craving. ? Squeeze or play with a ball. ? Do an activity or a hobby, like making bead jewelry, practicing needlepoint, or working with wood. ? Mix up your normal routine. ? Take a short exercise break. Go for a quick walk or run up and down stairs. ? Spend time in public places where smoking is not allowed.  Focus on doing something kind or helpful for someone else.  Call a friend or family member to talk during a craving.  Join a support group.  Call a quit line, such as 1-800-QUIT-NOW.  Talk with your health care provider about medicines that might help you cope with cravings and make quitting easier for you. How can I deal with withdrawal symptoms? Your body may experience negative effects as it tries to get used to not having nicotine in the system. These effects are called withdrawal symptoms. They may include:  Feeling hungrier than normal.  Trouble concentrating.  Irritability.  Trouble sleeping.  Feeling depressed.  Restlessness and agitation.  Craving a cigarette. To manage withdrawal symptoms:  Avoid places, people, and activities that trigger your cravings.  Remember why you want to quit.  Get plenty of sleep.  Avoid coffee and other caffeinated drinks. These may worsen some of your symptoms.  How can I handle social situations? Social situations can be difficult when you are quitting smoking, especially in the first few weeks. To manage this, you can:  Avoid parties, bars, and other social situations where people might be smoking.  Avoid alcohol.  Leave right away if you have the urge to smoke.  Explain to your family and friends that you are quitting smoking. Ask for understanding and support.  Plan activities with friends or family where smoking is not an option. What are some ways I can cope with stress? Wanting to smoke may cause stress, and stress can make you want to smoke. Find ways to manage your stress. Relaxation techniques can help. For example:  Breathe slowly and deeply, in through your nose and out through your mouth.  Listen to soothing, relaxing music.  Talk with a family member or friend about your stress.  Light a candle.  Soak in a bath or take a shower.  Think about a peaceful place. What are some ways I can prevent weight gain? Be aware that many people gain weight after they quit smoking. However, not everyone does. To keep from gaining weight, have a plan in place before you quit and stick to the plan after you quit. Your plan should include:  Having healthy snacks. When you have a craving, it may help to: ? Eat plain popcorn, crunchy carrots, celery, or other cut vegetables. ? Chew sugar-free gum.  Changing how you eat: ? Eat small portion sizes at meals. ? Eat 4-6 small meals   throughout the day instead of 1-2 large meals a day. ? Be mindful when you eat. Do not watch television or do other things that might distract you as you eat.  Exercising regularly: ? Make time to exercise each day. If you do not have time for a long workout, do short bouts of exercise for 5-10 minutes several times a day. ? Do some form of strengthening exercise, like weight lifting, and some form of aerobic exercise, like running or swimming.  Drinking plenty of  water or other low-calorie or no-calorie drinks. Drink 6-8 glasses of water daily, or as much as instructed by your health care provider. Summary  Quitting smoking is a physical and mental challenge. You will face cravings, withdrawal symptoms, and temptation to smoke again. Preparation can help you as you go through these challenges.  You can cope with cravings by keeping your mouth busy (such as by chewing gum), keeping your body and hands busy, and making calls to family, friends, or a helpline for people who want to quit smoking.  You can cope with withdrawal symptoms by avoiding places where people smoke, avoiding drinks with caffeine, and getting plenty of rest.  Ask your health care provider about the different ways to prevent weight gain, avoid stress, and handle social situations. This information is not intended to replace advice given to you by your health care provider. Make sure you discuss any questions you have with your health care provider. Document Released: 09/05/2016 Document Revised: 08/21/2017 Document Reviewed: 09/05/2016 Elsevier Patient Education  2020 Elsevier Inc.  

## 2019-06-14 NOTE — Progress Notes (Signed)
HPI  Pt presents to the clinic today to establish care and for management of the conditions listed below.   HTN: His BP today is 120/68.  He is not currently on any antihypertensive medications, but has been on multiple in the past. He denies headaches, dizziness, visual changes or chest pain.   Anemia of CKD: His last H/H was 7.6/24.3, 01/2019. He has had blood transfusions in the past. He reports he gets iron with his dialysis.  ESRD: Creatinine 12, GFR 5, 01/2019. HD, M/W/F in [place]. He does follow with nephrologist. He is currently preparing for kidney transplant but has to quit smoking. He is down to 3 cigarettes per day. He has failed patches and gums.  Flu: 06/2018 Tetanus: 01/2013 Pneumovax: 08/2017 Prevnar: 09/2018 PSA Screening: never Colon Screening: 07/2016 Vision Screening: as needed Dentist: as needed  Past Medical History:  Diagnosis Date  . Anemia   . CKD (chronic kidney disease)    Stage 5  Dialysis - M/W/F  . ETOH abuse   . Hypertension     Current Outpatient Medications  Medication Sig Dispense Refill  . albuterol (PROVENTIL HFA;VENTOLIN HFA) 108 (90 Base) MCG/ACT inhaler Inhale 2 puffs into the lungs every 4 (four) hours as needed for wheezing or shortness of breath. 1 Inhaler 2  . calcium acetate (PHOSLO) 667 MG capsule Take 2 capsules (1,334 mg total) by mouth 3 (three) times daily with meals. 90 capsule 2  . RENVELA 800 MG tablet Take 2,400 mg by mouth 3 (three) times daily with meals.   6   No current facility-administered medications for this visit.     No Known Allergies  Family History  Problem Relation Age of Onset  . Diabetes Mother   . Kidney failure Mother   . Kidney failure Brother   . Post-traumatic stress disorder Neg Hx   . Bladder Cancer Neg Hx   . Kidney cancer Neg Hx     Social History   Socioeconomic History  . Marital status: Single    Spouse name: Not on file  . Number of children: Not on file  . Years of education: Not  on file  . Highest education level: Not on file  Occupational History  . Not on file  Social Needs  . Financial resource strain: Not on file  . Food insecurity    Worry: Not on file    Inability: Not on file  . Transportation needs    Medical: Not on file    Non-medical: Not on file  Tobacco Use  . Smoking status: Current Every Day Smoker    Packs/day: 0.50    Years: 15.00    Pack years: 7.50    Types: Cigarettes  . Smokeless tobacco: Never Used  . Tobacco comment: down to 1 a day  Substance and Sexual Activity  . Alcohol use: Yes    Alcohol/week: 21.0 standard drinks    Types: 21 Cans of beer per week    Comment: none since 08/04/16  . Drug use: No  . Sexual activity: Not on file  Lifestyle  . Physical activity    Days per week: Not on file    Minutes per session: Not on file  . Stress: Not on file  Relationships  . Social Herbalist on phone: Not on file    Gets together: Not on file    Attends religious service: Not on file    Active member of club or organization: Not on file  Attends meetings of clubs or organizations: Not on file    Relationship status: Not on file  . Intimate partner violence    Fear of current or ex partner: Not on file    Emotionally abused: Not on file    Physically abused: Not on file    Forced sexual activity: Not on file  Other Topics Concern  . Not on file  Social History Narrative  . Not on file    ROS:  Constitutional: Denies fever, malaise, fatigue, headache or abrupt weight changes.  HEENT: Denies eye pain, eye redness, ear pain, ringing in the ears, wax buildup, runny nose, nasal congestion, bloody nose, or sore throat. Respiratory: Denies difficulty breathing, shortness of breath, cough or sputum production.   Cardiovascular: Denies chest pain, chest tightness, palpitations or swelling in the hands or feet.  Gastrointestinal: Denies abdominal pain, bloating, constipation, diarrhea or blood in the stool.  GU:  Denies frequency, urgency, pain with urination, blood in urine, odor or discharge. Musculoskeletal: Denies decrease in range of motion, difficulty with gait, muscle pain or joint pain and swelling.  Skin: Pt reports lump of right upper thigh. Denies redness, rashes, lesions or ulcercations.  Neurological: Denies dizziness, difficulty with memory, difficulty with speech or problems with balance and coordination.  Psych: Denies anxiety, depression, SI/HI.  No other specific complaints in a complete review of systems (except as listed in HPI above).  PE:  BP 120/68   Pulse 100   Temp 97.8 F (36.6 C) (Temporal)   Wt 157 lb (71.2 kg)   SpO2 97%   BMI 26.95 kg/m  Wt Readings from Last 3 Encounters:  06/14/19 157 lb (71.2 kg)  02/18/19 155 lb (70.3 kg)  02/11/19 154 lb 15.7 oz (70.3 kg)    General: Appears older than his stated age, well developed, well nourished in NAD. Head: Normocephalic and atraumatic. Skin: Cord like nonmobile tortouos mass noted of right lateral upper thigh. Cardiovascular: Normal rate and rhythm. S1,S2 noted.  No murmur, rubs or gallops noted. No JVD or BLE edema. Left upper arm AV fistula with +bruti/thrill.  Pulmonary/Chest: Normal effort and diminshed breath sounds. No respiratory distress. No wheezes, rales or ronchi noted.  Musculoskeletal:   No difficulty with gait.  Neurological: Alert and oriented. Cranial nerves II-XII grossly intact. Coordination normal.  Psychiatric: Mood and affect normal. Behavior is normal. Judgment and thought content normal.   BMET    Component Value Date/Time   NA 141 02/18/2019 0730   K 5.0 02/18/2019 0730   CL 99 02/18/2019 0730   CO2 22 02/18/2019 0730   GLUCOSE 79 02/18/2019 0730   BUN 73 (H) 02/18/2019 0730   CREATININE 13.00 (H) 02/18/2019 0730   CALCIUM 9.1 02/18/2019 0730   GFRNONAA 4 (L) 02/18/2019 0730   GFRAA 5 (L) 02/18/2019 0730    Lipid Panel     Component Value Date/Time   CHOL 145 05/17/2018 0359    TRIG 67 05/17/2018 0359   HDL 34 (L) 05/17/2018 0359   CHOLHDL 4.3 05/17/2018 0359   VLDL 13 05/17/2018 0359   LDLCALC 98 05/17/2018 0359    CBC    Component Value Date/Time   WBC 12.5 (H) 02/18/2019 0730   RBC 2.29 (L) 02/18/2019 0730   HGB 7.6 (L) 02/18/2019 0730   HCT 24.3 (L) 02/18/2019 0730   PLT 123 (L) 02/18/2019 0730   MCV 106.1 (H) 02/18/2019 0730   MCH 33.2 02/18/2019 0730   MCHC 31.3 02/18/2019 0730  RDW 17.1 (H) 02/18/2019 0730   LYMPHSABS 1.3 02/18/2019 0730   MONOABS 0.8 02/18/2019 0730   EOSABS 0.7 (H) 02/18/2019 0730   BASOSABS 0.1 02/18/2019 0730    Hgb A1C Lab Results  Component Value Date   HGBA1C 4.7 (L) 05/17/2018     Assessment and Plan:  Mass of Right Upper Thigh:  ? Varicosity Occurred after steroid injection Will obtain u/s RLE for further eval, no concern of DVT  Encounter for Smoking Cessation:  Support offered today Will trial Chantix  Make an appt for your Medicare Wellness Exam Webb Silversmith, NP

## 2019-06-16 ENCOUNTER — Other Ambulatory Visit: Payer: Self-pay | Admitting: Internal Medicine

## 2019-06-16 ENCOUNTER — Other Ambulatory Visit: Payer: Self-pay

## 2019-06-16 ENCOUNTER — Telehealth: Payer: Self-pay | Admitting: *Deleted

## 2019-06-16 ENCOUNTER — Ambulatory Visit: Admission: RE | Admit: 2019-06-16 | Payer: Medicare Other | Source: Ambulatory Visit

## 2019-06-16 ENCOUNTER — Ambulatory Visit (INDEPENDENT_AMBULATORY_CARE_PROVIDER_SITE_OTHER): Payer: Medicare Other

## 2019-06-16 DIAGNOSIS — R2241 Localized swelling, mass and lump, right lower limb: Secondary | ICD-10-CM | POA: Diagnosis not present

## 2019-06-16 NOTE — Telephone Encounter (Signed)
Kim at [facility] called stating that patient was there this morning for a pre-op and wanted to go ahead and have his Ultrasound done there. Patient was suppose to come to her department, but has not shown up yet. Kim requested a call back for verification of orders. Maudie Mercury stated that she has sent an order over to you to sign, but wants to discuss all the test needed.

## 2019-06-16 NOTE — Telephone Encounter (Signed)
Jordan [name], can you help me with this. Was he scheduled somewhere else or at a different time.

## 2019-06-16 NOTE — Telephone Encounter (Signed)
Patient went were he was supposed to go today for his [place] at [facility]. The [place] Dept at [facility]   somehow got involved and were trying to help him out by putting another order in but her never showed up because he went where he was supposed to go.

## 2019-06-20 ENCOUNTER — Telehealth: Payer: Self-pay | Admitting: Internal Medicine

## 2019-06-20 NOTE — Telephone Encounter (Signed)
Misty from [facility] is calling in regards to the patient New Patient appointment,.  She wanted to check with [place] to see if there is anyway possible they could get office notes on that appointment   Phone- [phone]

## 2019-06-23 ENCOUNTER — Other Ambulatory Visit: Payer: Self-pay

## 2019-06-23 ENCOUNTER — Ambulatory Visit (INDEPENDENT_AMBULATORY_CARE_PROVIDER_SITE_OTHER): Payer: Medicare Other | Admitting: Internal Medicine

## 2019-06-23 ENCOUNTER — Telehealth: Payer: Self-pay

## 2019-06-23 ENCOUNTER — Encounter: Payer: Self-pay | Admitting: Internal Medicine

## 2019-06-23 VITALS — BP 132/88 | HR 103 | Temp 98.6°F | Wt 158.0 lb

## 2019-06-23 DIAGNOSIS — R202 Paresthesia of skin: Secondary | ICD-10-CM | POA: Diagnosis not present

## 2019-06-23 DIAGNOSIS — R2241 Localized swelling, mass and lump, right lower limb: Secondary | ICD-10-CM

## 2019-06-23 LAB — VITAMIN D 25 HYDROXY (VIT D DEFICIENCY, FRACTURES): VITD: 51.22 ng/mL (ref 30.00–100.00)

## 2019-06-23 LAB — VITAMIN B12: Vitamin B-12: 265 pg/mL (ref 211–911)

## 2019-06-23 LAB — HEMOGLOBIN A1C: Hgb A1c MFr Bld: 6.3 % (ref 4.6–6.5)

## 2019-06-23 NOTE — Progress Notes (Signed)
Subjective:    Patient ID: Jordan [name], male    DOB: [birth year], 53 y.o.   MRN: [id number]  HPI  Pt presents to the clinic today with c/o right leg pain, numbness and tingling. He reports this has been occurring every time he has dialysis. He had to stop dialysis yesterday because the pain was too intense. He describes the pain as sharp and shooting, starting in the right mid thigh and radiating into his toes. He denies back pain or back injury.  He believes this is coming from a knot on his right lateral thigh, that developed after a epinephrine injection at the hospital 11/2018. Ultrasound of the area showed:  There are 2 avascular, anechoic areas on the far lateral mid right thigh. Neither one is compressible. They measure 0.9 x 0.3 cm and, 0.5 x 0.2 cm.  He had a previous angiogram of the right lower leg by vascular that did not show any abnormalities.  Review of Systems      Past Medical History:  Diagnosis Date  . Anemia   . CKD (chronic kidney disease)    Stage 5  Dialysis - M/W/F  . ETOH abuse   . Hypertension     Current Outpatient Medications  Medication Sig Dispense Refill  . albuterol (PROVENTIL HFA;VENTOLIN HFA) 108 (90 Base) MCG/ACT inhaler Inhale 2 puffs into the lungs every 4 (four) hours as needed for wheezing or shortness of breath. 1 Inhaler 2  . calcium acetate (PHOSLO) 667 MG capsule Take 2 capsules (1,334 mg total) by mouth 3 (three) times daily with meals. 90 capsule 2  . RENVELA 800 MG tablet Take 2,400 mg by mouth 3 (three) times daily with meals.   6  . varenicline (CHANTIX STARTING MONTH PAK) 0.5 MG X 11 & 1 MG X 42 tablet Take one 0.5 mg tablet by mouth once daily for 3 days, then increase to one 0.5 mg tablet twice daily for 4 days, then increase to one 1 mg tablet twice daily. 53 tablet 0   No current facility-administered medications for this visit.     No Known Allergies  Family History  Problem Relation Age of Onset  . Diabetes Mother   .  Kidney failure Mother   . Kidney failure Brother   . Post-traumatic stress disorder Neg Hx   . Bladder Cancer Neg Hx   . Kidney cancer Neg Hx     Social History   Socioeconomic History  . Marital status: Single    Spouse name: Not on file  . Number of children: Not on file  . Years of education: Not on file  . Highest education level: Not on file  Occupational History  . Not on file  Social Needs  . Financial resource strain: Not on file  . Food insecurity    Worry: Not on file    Inability: Not on file  . Transportation needs    Medical: Not on file    Non-medical: Not on file  Tobacco Use  . Smoking status: Current Every Day Smoker    Packs/day: 0.50    Years: 15.00    Pack years: 7.50    Types: Cigarettes  . Smokeless tobacco: Never Used  . Tobacco comment: down to 1 a day  Substance and Sexual Activity  . Alcohol use: Yes    Alcohol/week: 21.0 standard drinks    Types: 21 Cans of beer per week    Comment: none since 08/04/16  . Drug use:  No  . Sexual activity: Not on file  Lifestyle  . Physical activity    Days per week: Not on file    Minutes per session: Not on file  . Stress: Not on file  Relationships  . Social Herbalist on phone: Not on file    Gets together: Not on file    Attends religious service: Not on file    Active member of club or organization: Not on file    Attends meetings of clubs or organizations: Not on file    Relationship status: Not on file  . Intimate partner violence    Fear of current or ex partner: Not on file    Emotionally abused: Not on file    Physically abused: Not on file    Forced sexual activity: Not on file  Other Topics Concern  . Not on file  Social History Narrative  . Not on file     Constitutional: Denies fever, malaise, fatigue, headache or abrupt weight changes.  Respiratory: Denies difficulty breathing, shortness of breath, cough or sputum production.   Cardiovascular: Denies chest pain,  chest tightness, palpitations or swelling in the hands or feet.  Musculoskeletal: Denies decrease in range of motion, difficulty with gait, muscle pain or joint pain and swelling.  Skin: Pt reports mass of right lateral thigh. Denies redness, rashes, or ulcercations.  Neurological: Pt reports numbness and tingling of right leg.Denies dizziness, difficulty with memory, difficulty with speech or problems with balance and coordination.    No other specific complaints in a complete review of systems (except as listed in HPI above).  Objective:   Physical Exam  BP 132/88   Pulse (!) 103   Temp 98.6 F (37 C) (Temporal)   Wt 158 lb (71.7 kg)   SpO2 98%   BMI 27.12 kg/m  Wt Readings from Last 3 Encounters:  06/23/19 158 lb (71.7 kg)  06/14/19 157 lb (71.2 kg)  02/18/19 155 lb (70.3 kg)    General: Appears his stated age, well developed, well nourished in NAD. Skin: Warm, dry and intact. 2 distinct masses noted of right lateral thigh, smaller in size than previous exam. Cardiovascular: Tachycardic.  Pedal pulses 2+ bilaterally. Pulmonary/Chest: Normal effort and positive vesicular breath sounds. No respiratory distress. No wheezes, rales or ronchi noted.  Musculoskeletal: Normal flexion, extension and rotation of the lumbar spine.  No bony tenderness noted over the lumbar spine.  Normal flexion, extension, internal and external rotation of the right hip.  No pain with palpation of the right hip.  Strength 5/5 B LE.  No difficulty with gait.  Neurological: Alert and oriented.  Sensation intact to BLE.   BMET    Component Value Date/Time   NA 141 02/18/2019 0730   K 5.0 02/18/2019 0730   CL 99 02/18/2019 0730   CO2 22 02/18/2019 0730   GLUCOSE 79 02/18/2019 0730   BUN 73 (H) 02/18/2019 0730   CREATININE 13.00 (H) 02/18/2019 0730   CALCIUM 9.1 02/18/2019 0730   GFRNONAA 4 (L) 02/18/2019 0730   GFRAA 5 (L) 02/18/2019 0730    Lipid Panel     Component Value Date/Time   CHOL 145  05/17/2018 0359   TRIG 67 05/17/2018 0359   HDL 34 (L) 05/17/2018 0359   CHOLHDL 4.3 05/17/2018 0359   VLDL 13 05/17/2018 0359   LDLCALC 98 05/17/2018 0359    CBC    Component Value Date/Time   WBC 12.5 (H) 02/18/2019 0730  RBC 2.29 (L) 02/18/2019 0730   HGB 7.6 (L) 02/18/2019 0730   HCT 24.3 (L) 02/18/2019 0730   PLT 123 (L) 02/18/2019 0730   MCV 106.1 (H) 02/18/2019 0730   MCH 33.2 02/18/2019 0730   MCHC 31.3 02/18/2019 0730   RDW 17.1 (H) 02/18/2019 0730   LYMPHSABS 1.3 02/18/2019 0730   MONOABS 0.8 02/18/2019 0730   EOSABS 0.7 (H) 02/18/2019 0730   BASOSABS 0.1 02/18/2019 0730    Hgb A1C Lab Results  Component Value Date   HGBA1C 4.7 (L) 05/17/2018            Assessment & Plan:   Mass of Thigh:  Ultrasound was not helpful in diagnosis of this mass ?  Scar tissue from epinephrine injection in the right lateral thigh  Paresthesia of Right Lower Extremity:  X-ray lumbar spine today Will check vitamin D, B12 and A1c today Consider PT versus MRI of the lumbar spine  We will follow-up after labs and x-rays, return precautions discussed Webb Silversmith, NP

## 2019-06-23 NOTE — Telephone Encounter (Signed)
Pt needs to return to have xrays taken, LVM to call xray

## 2019-06-25 ENCOUNTER — Encounter: Payer: Self-pay | Admitting: Internal Medicine

## 2019-06-25 NOTE — Patient Instructions (Signed)
Paresthesia Paresthesia is a burning or prickling feeling. This feeling can happen in any part of the body. It often happens in the hands, arms, legs, or feet. Usually, it is not painful. In most cases, the feeling goes away in a short time and is not a sign of a serious problem. If you have paresthesia that lasts a long time, you may need to be seen by your doctor. Follow these instructions at home: Alcohol use   Do not drink alcohol if: ? Your doctor tells you not to drink. ? You are pregnant, may be pregnant, or are planning to become pregnant.  If you drink alcohol: ? Limit how much you use to:  0-1 drink a day for women.  0-2 drinks a day for men. ? Be aware of how much alcohol is in your drink. In the [place] one drink equals one 12 oz bottle of beer (355 mL), one 5 oz glass of wine (148 mL), or one 1 oz glass of hard liquor (44 mL). Nutrition   Eat a healthy diet. This includes: ? Eating foods that have a lot of fiber in them, such as fresh fruits and vegetables, whole grains, and beans. ? Limiting foods that have a lot of fat and processed sugars in them, such as fried or sweet foods. General instructions  Take over-the-counter and prescription medicines only as told by your doctor.  Do not use any products that have nicotine or tobacco in them, such as cigarettes and e-cigarettes. If you need help quitting, ask your doctor.  If you have diabetes, work with your doctor to make sure your blood sugar stays in a healthy range.  If your feet feel numb: ? Check for redness, warmth, and swelling every day. ? Wear padded socks and comfortable shoes. These help protect your feet.  Keep all follow-up visits as told by your doctor. This is important. Contact a doctor if:  You have paresthesia that gets worse or does not go away.  Your burning or prickling feeling gets worse when you walk.  You have pain or cramps.  You feel dizzy.  You have a rash. Get help right away if  you:  Feel weak.  Have trouble walking or moving.  Have problems speaking, understanding, or seeing.  Feel confused.  Cannot control when you pee (urinate) or poop (have a bowel movement).  Lose feeling (have numbness) after an injury.  Have new weakness in an arm or leg.  Pass out (faint). Summary  Paresthesia is a burning or prickling feeling. It often happens in the hands, arms, legs, or feet.  In most cases, the feeling goes away in a short time and is not a sign of a serious problem.  If you have paresthesia that lasts a long time, you may need to be seen by your doctor. This information is not intended to replace advice given to you by your health care provider. Make sure you discuss any questions you have with your health care provider. Document Released: 08/21/2008 Document Revised: 10/04/2018 Document Reviewed: 09/17/2017 Elsevier Patient Education  2020 [employer]  

## 2019-06-28 ENCOUNTER — Ambulatory Visit (INDEPENDENT_AMBULATORY_CARE_PROVIDER_SITE_OTHER)
Admission: RE | Admit: 2019-06-28 | Discharge: 2019-06-28 | Disposition: A | Discharge status: Home / Self Care | Payer: Medicare Other | Source: Ambulatory Visit | Attending: Internal Medicine | Admitting: Internal Medicine

## 2019-06-28 DIAGNOSIS — R202 Paresthesia of skin: Secondary | ICD-10-CM

## 2019-06-28 IMAGING — DX DG LUMBAR SPINE COMPLETE 4+V
5 series · 5 of 5 positions shown · non-contrast
Comparison: Radiographs dated [DATE]

CLINICAL DATA: Right leg paresthesia.

EXAM:
LUMBAR SPINE - COMPLETE 4+ VIEW

[l-spine ap]
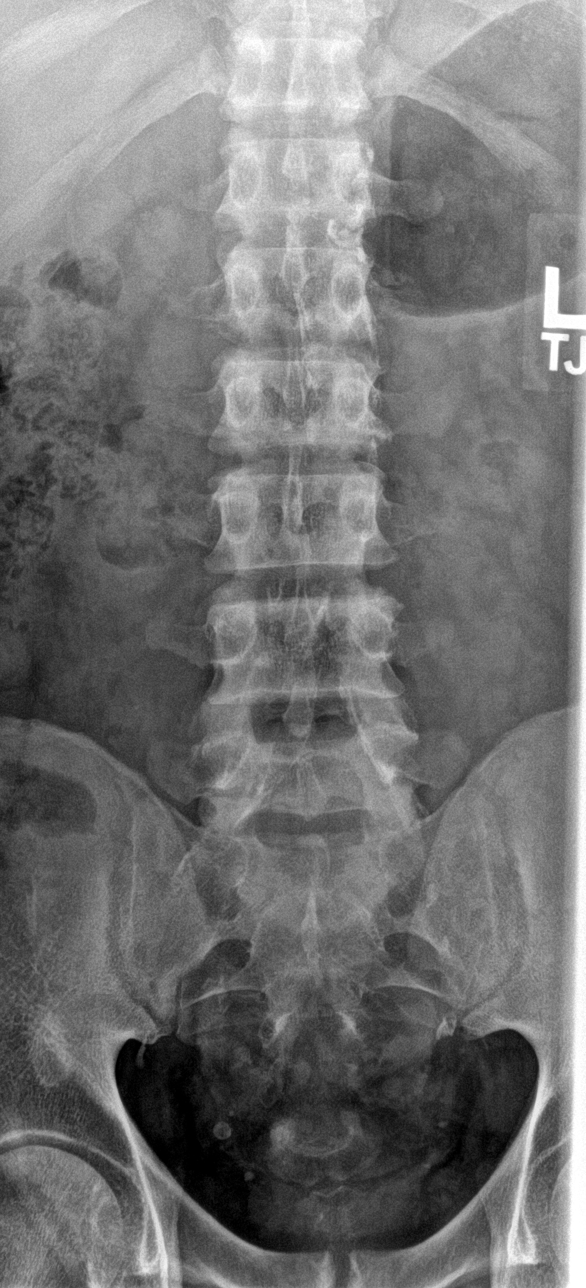

[l-spine obl (1 of 2)]
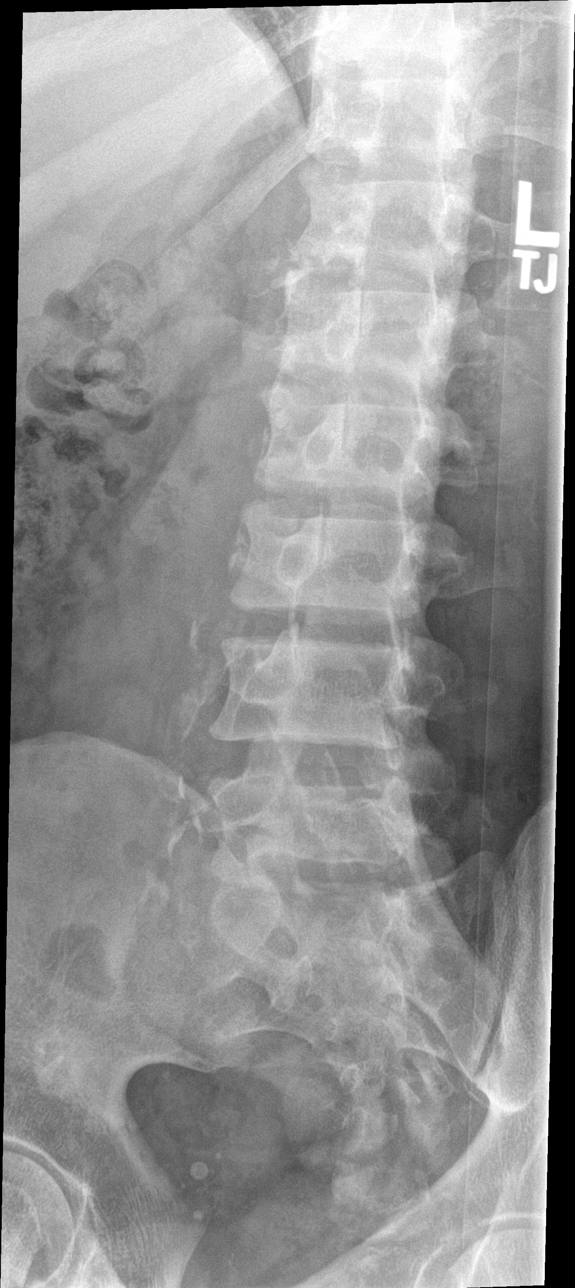

[l-spine obl (2 of 2)]
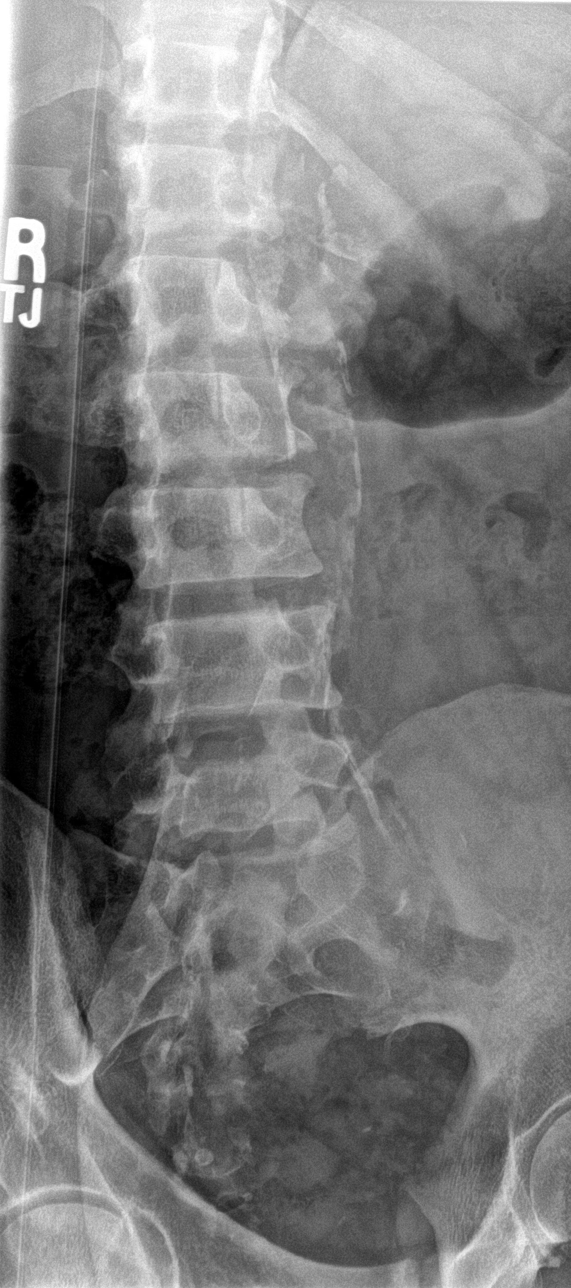

[l-spine lat]
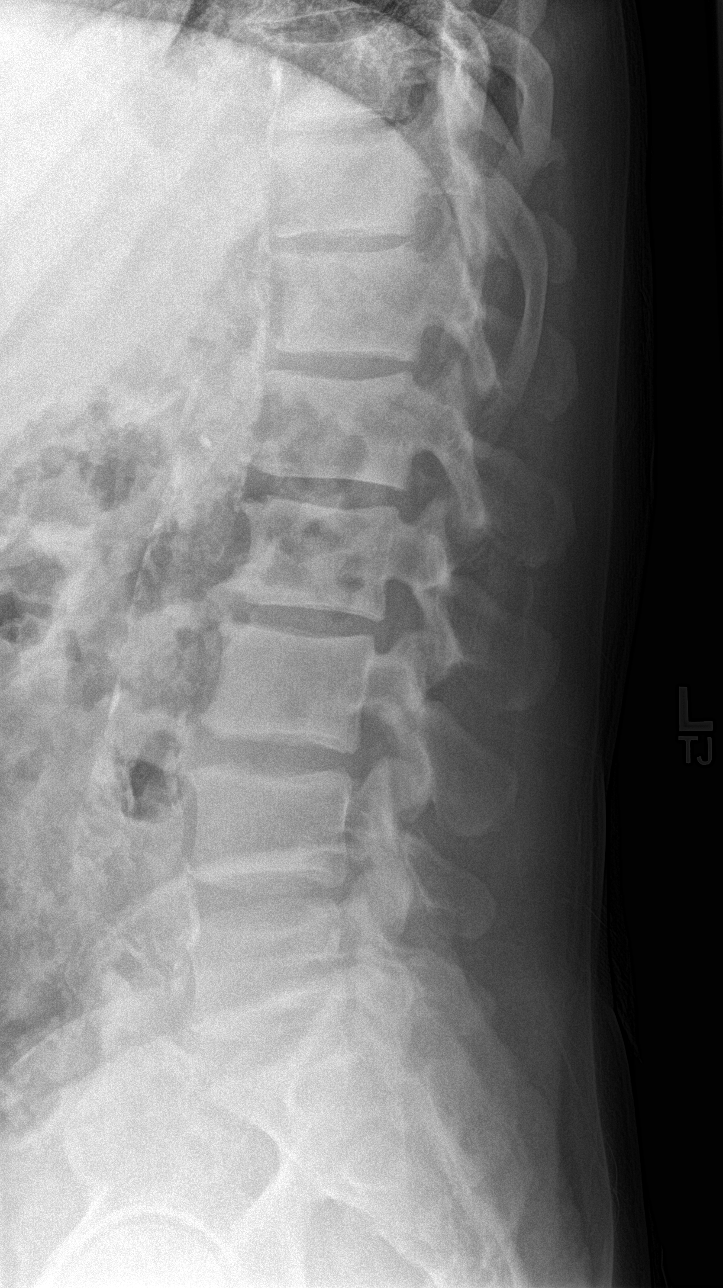

[l-spine l5/s1]
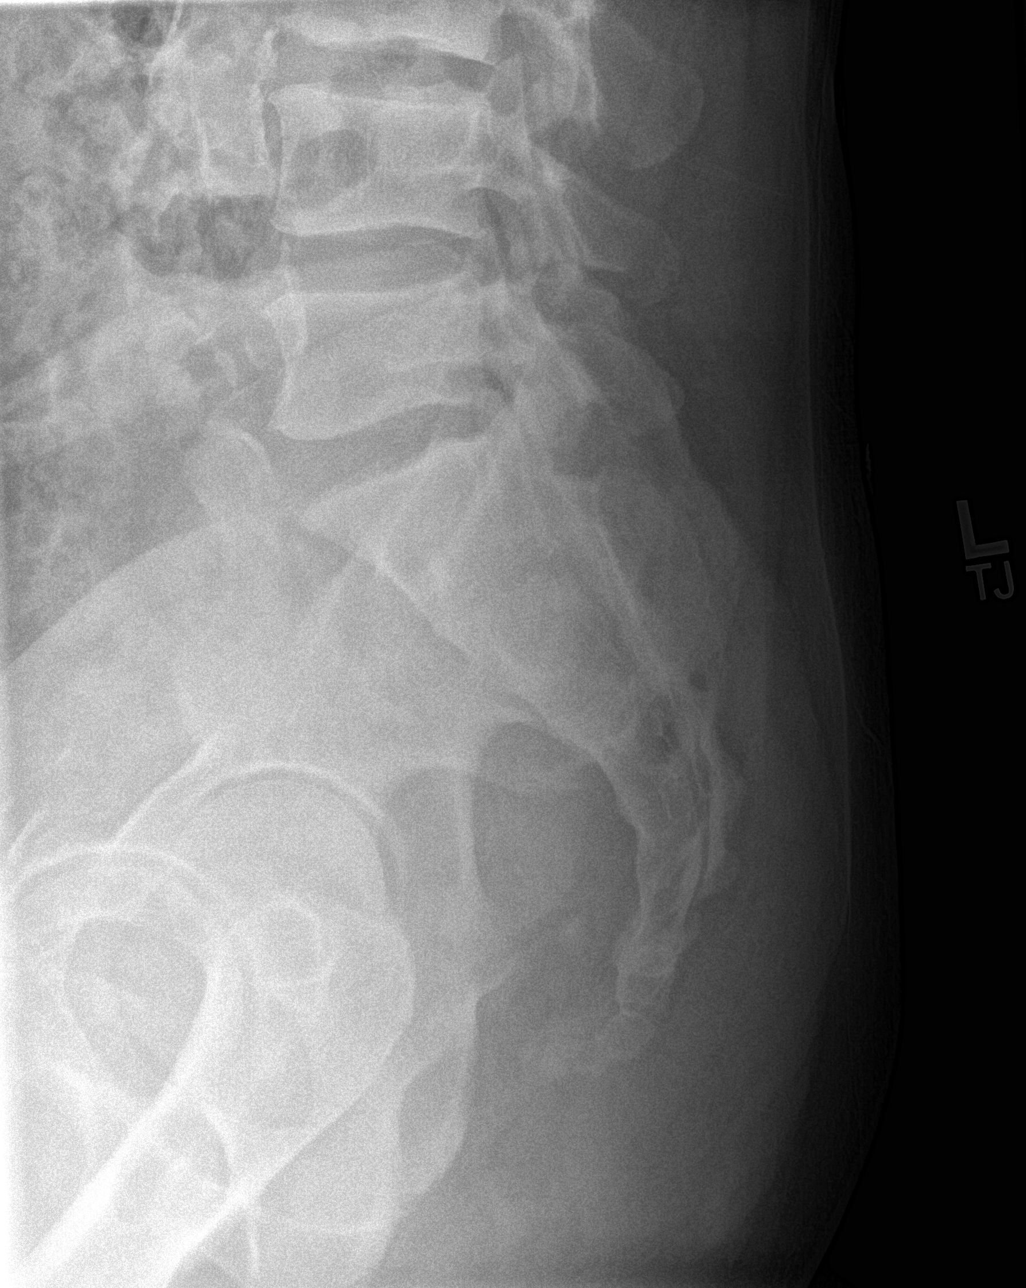

[5 of 5 positions shown; findings below may reference images not displayed]

FINDINGS: There are 6 non-rib-bearing vertebra in the lumbar spine. There is
no disc space narrowing, fracture, subluxation, facet arthritis, or
other abnormality of the lumbar spine. The sacroiliac joints appear
normal.

The patient does have extensive arterial vascular calcification in
the abdominal aorta and iliac vessels, significantly progressed
since [EY].
IMPRESSION: No significant abnormality of the lumbar spine.

The patient has extensive arterial vascular calcification in the
abdominal aorta and iliac vessels, significantly progressed since

## 2019-07-01 ENCOUNTER — Telehealth: Payer: Self-pay | Admitting: Internal Medicine

## 2019-07-01 ENCOUNTER — Other Ambulatory Visit: Payer: Self-pay | Admitting: Internal Medicine

## 2019-07-01 DIAGNOSIS — R2241 Localized swelling, mass and lump, right lower limb: Secondary | ICD-10-CM

## 2019-07-01 NOTE — Telephone Encounter (Signed)
Patient stated that his leg is still giving him trouble and in pain. He wanted to know if it is possible for him to come in and have an xray done.  Patient is only available on Tuesdays or Thursdays due to dialysis    Are we okay to schedule this for him?

## 2019-07-01 NOTE — Telephone Encounter (Signed)
See result note from lumbar xray

## 2019-07-12 ENCOUNTER — Ambulatory Visit: Admission: RE | Admit: 2019-07-12 | Payer: Medicare Other | Source: Ambulatory Visit

## 2019-07-19 ENCOUNTER — Ambulatory Visit: Admission: RE | Admit: 2019-07-19 | Payer: Medicare Other | Source: Ambulatory Visit

## 2019-07-22 ENCOUNTER — Emergency Department (HOSPITAL_COMMUNITY): Payer: No Typology Code available for payment source

## 2019-07-22 ENCOUNTER — Emergency Department (HOSPITAL_COMMUNITY)
Admission: EM | Admit: 2019-07-22 | Discharge: 2019-07-22 | Disposition: A | Discharge status: Home / Self Care | Payer: No Typology Code available for payment source | Attending: Emergency Medicine | Admitting: Emergency Medicine

## 2019-07-22 DIAGNOSIS — Y9389 Activity, other specified: Secondary | ICD-10-CM | POA: Insufficient documentation

## 2019-07-22 DIAGNOSIS — N186 End stage renal disease: Secondary | ICD-10-CM | POA: Insufficient documentation

## 2019-07-22 DIAGNOSIS — T1490XA Injury, unspecified, initial encounter: Secondary | ICD-10-CM

## 2019-07-22 DIAGNOSIS — S80812A Abrasion, left lower leg, initial encounter: Secondary | ICD-10-CM | POA: Diagnosis not present

## 2019-07-22 DIAGNOSIS — Y92410 Unspecified street and highway as the place of occurrence of the external cause: Secondary | ICD-10-CM | POA: Insufficient documentation

## 2019-07-22 DIAGNOSIS — M25061 Hemarthrosis, right knee: Secondary | ICD-10-CM

## 2019-07-22 DIAGNOSIS — M542 Cervicalgia: Secondary | ICD-10-CM | POA: Diagnosis not present

## 2019-07-22 DIAGNOSIS — Y999 Unspecified external cause status: Secondary | ICD-10-CM | POA: Diagnosis not present

## 2019-07-22 DIAGNOSIS — F17210 Nicotine dependence, cigarettes, uncomplicated: Secondary | ICD-10-CM | POA: Insufficient documentation

## 2019-07-22 DIAGNOSIS — S022XXA Fracture of nasal bones, initial encounter for closed fracture: Secondary | ICD-10-CM | POA: Insufficient documentation

## 2019-07-22 DIAGNOSIS — Z992 Dependence on renal dialysis: Secondary | ICD-10-CM | POA: Diagnosis not present

## 2019-07-22 DIAGNOSIS — S0101XA Laceration without foreign body of scalp, initial encounter: Secondary | ICD-10-CM | POA: Diagnosis not present

## 2019-07-22 DIAGNOSIS — I120 Hypertensive chronic kidney disease with stage 5 chronic kidney disease or end stage renal disease: Secondary | ICD-10-CM | POA: Insufficient documentation

## 2019-07-22 DIAGNOSIS — S80811A Abrasion, right lower leg, initial encounter: Secondary | ICD-10-CM | POA: Insufficient documentation

## 2019-07-22 DIAGNOSIS — S0990XA Unspecified injury of head, initial encounter: Secondary | ICD-10-CM | POA: Diagnosis present

## 2019-07-22 LAB — I-STAT CHEM 8, ED
BUN: 53 mg/dL — ABNORMAL HIGH (ref 6–20)
Calcium, Ion: 0.86 mmol/L — CL (ref 1.15–1.40)
Chloride: 99 mmol/L (ref 98–111)
Creatinine, Ser: 15.1 mg/dL — ABNORMAL HIGH (ref 0.61–1.24)
Glucose, Bld: 111 mg/dL — ABNORMAL HIGH (ref 70–99)
HCT: 31 % — ABNORMAL LOW (ref 39.0–52.0)
Hemoglobin: 10.5 g/dL — ABNORMAL LOW (ref 13.0–17.0)
Potassium: 4.6 mmol/L (ref 3.5–5.1)
Sodium: 135 mmol/L (ref 135–145)
TCO2: 24 mmol/L (ref 22–32)

## 2019-07-22 LAB — CBC
HCT: 33.5 % — ABNORMAL LOW (ref 39.0–52.0)
Hemoglobin: 11.1 g/dL — ABNORMAL LOW (ref 13.0–17.0)
MCH: 33 pg (ref 26.0–34.0)
MCHC: 33.1 g/dL (ref 30.0–36.0)
MCV: 99.7 fL (ref 80.0–100.0)
Platelets: 151 10*3/uL (ref 150–400)
RBC: 3.36 MIL/uL — ABNORMAL LOW (ref 4.22–5.81)
RDW: 15.4 % (ref 11.5–15.5)
WBC: 7.8 10*3/uL (ref 4.0–10.5)
nRBC: 0 % (ref 0.0–0.2)

## 2019-07-22 LAB — SAMPLE TO BLOOD BANK

## 2019-07-22 LAB — COMPREHENSIVE METABOLIC PANEL
ALT: 17 U/L (ref 0–44)
AST: 21 U/L (ref 15–41)
Albumin: 3.4 g/dL — ABNORMAL LOW (ref 3.5–5.0)
Alkaline Phosphatase: 183 U/L — ABNORMAL HIGH (ref 38–126)
Anion gap: 20 — ABNORMAL HIGH (ref 5–15)
BUN: 58 mg/dL — ABNORMAL HIGH (ref 6–20)
CO2: 23 mmol/L (ref 22–32)
Calcium: 8 mg/dL — ABNORMAL LOW (ref 8.9–10.3)
Chloride: 95 mmol/L — ABNORMAL LOW (ref 98–111)
Creatinine, Ser: 14.14 mg/dL — ABNORMAL HIGH (ref 0.61–1.24)
GFR calc Af Amer: 4 mL/min — ABNORMAL LOW (ref >60)
GFR calc non Af Amer: 4 mL/min — ABNORMAL LOW (ref >60)
Glucose, Bld: 112 mg/dL — ABNORMAL HIGH (ref 70–99)
Potassium: 4.8 mmol/L (ref 3.5–5.1)
Sodium: 138 mmol/L (ref 135–145)
Total Bilirubin: 0.8 mg/dL (ref 0.3–1.2)
Total Protein: 6.7 g/dL (ref 6.5–8.1)

## 2019-07-22 LAB — LIPASE, BLOOD: Lipase: 51 U/L (ref 11–51)

## 2019-07-22 LAB — PROTIME-INR
INR: 1 (ref 0.8–1.2)
Prothrombin Time: 13.4 seconds (ref 11.4–15.2)

## 2019-07-22 LAB — CDS SEROLOGY

## 2019-07-22 LAB — LACTIC ACID, PLASMA: Lactic Acid, Venous: 2.1 mmol/L (ref 0.5–1.9)
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
## 2019-07-22 MED ORDER — IBUPROFEN 600 MG PO TABS: 600.0000 mg | ORAL_TABLET | Freq: Four times a day (QID) | ORAL | 0 refills | Status: DC | PRN

## 2019-07-22 MED ORDER — METHOCARBAMOL 500 MG PO TABS: 500.0000 mg | ORAL_TABLET | Freq: Two times a day (BID) | ORAL | 0 refills | Status: DC

## 2019-07-22 MED ORDER — LIDOCAINE HCL (PF) 1 % IJ SOLN: 30.0000 mL | Freq: Once | INTRAMUSCULAR | Status: DC

## 2019-07-22 MED ORDER — IOHEXOL 350 MG/ML SOLN
100.0000 mL | Freq: Once | INTRAVENOUS | Status: AC | PRN
  Administered 2019-07-22: 100 mL via INTRAVENOUS

## 2019-07-22 MED ORDER — MORPHINE SULFATE (PF) 4 MG/ML IV SOLN: 4.0000 mg | Freq: Once | INTRAVENOUS | Status: DC

## 2019-07-22 MED ORDER — MORPHINE SULFATE (PF) 4 MG/ML IV SOLN
8.0000 mg | Freq: Once | INTRAVENOUS | Status: AC
  Administered 2019-07-22: 8 mg via INTRAVENOUS
  Filled 2019-07-22: qty 2

## 2019-07-22 MED ORDER — LIDOCAINE-EPINEPHRINE (PF) 2 %-1:200000 IJ SOLN
10.0000 mL | Freq: Once | INTRAMUSCULAR | Status: AC
  Administered 2019-07-22: 10 mL
  Filled 2019-07-22: qty 20

## 2019-07-22 MED ORDER — BUPIVACAINE HCL (PF) 0.5 % IJ SOLN: 30.0000 mL | Freq: Once | INTRAMUSCULAR | Status: DC

## 2019-07-22 MED ORDER — MORPHINE SULFATE (PF) 4 MG/ML IV SOLN
4.0000 mg | Freq: Once | INTRAVENOUS | Status: AC
  Administered 2019-07-22: 4 mg via INTRAVENOUS
  Filled 2019-07-22: qty 1
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
## 2019-07-22 NOTE — ED Notes (Signed)
Family Relative [facility]) [phone]
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
## 2019-07-22 NOTE — ED Notes (Signed)
Patient verbalizes understanding of discharge instructions. Opportunity for questioning and answers were provided. Armband removed by staff, pt discharged from ED via wheelchair to home.  

## 2019-07-26 ENCOUNTER — Ambulatory Visit: Payer: Medicare Other | Admitting: Internal Medicine

## 2019-07-26 ENCOUNTER — Telehealth: Payer: Self-pay

## 2019-07-26 NOTE — Telephone Encounter (Signed)
Noted, will discuss at upcoming appt 
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
## 2019-07-28 ENCOUNTER — Ambulatory Visit: Admission: RE | Admit: 2019-07-28 | Payer: No Typology Code available for payment source | Source: Ambulatory Visit

## 2019-07-29 ENCOUNTER — Ambulatory Visit: Payer: Medicare Other | Admitting: Internal Medicine
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
## 2019-08-04 ENCOUNTER — Ambulatory Visit (INDEPENDENT_AMBULATORY_CARE_PROVIDER_SITE_OTHER): Payer: Medicare Other | Admitting: Internal Medicine

## 2019-08-04 ENCOUNTER — Encounter: Payer: Self-pay | Admitting: Internal Medicine

## 2019-08-04 ENCOUNTER — Telehealth: Payer: Self-pay | Admitting: Internal Medicine

## 2019-08-04 ENCOUNTER — Other Ambulatory Visit: Payer: Self-pay

## 2019-08-04 DIAGNOSIS — R2241 Localized swelling, mass and lump, right lower limb: Secondary | ICD-10-CM

## 2019-08-04 DIAGNOSIS — S0191XD Laceration without foreign body of unspecified part of head, subsequent encounter: Secondary | ICD-10-CM | POA: Diagnosis not present

## 2019-08-04 DIAGNOSIS — M25461 Effusion, right knee: Secondary | ICD-10-CM

## 2019-08-04 MED ORDER — TRAMADOL HCL 50 MG PO TABS: 50.0000 mg | ORAL_TABLET | Freq: Two times a day (BID) | ORAL | 0 refills | Status: AC | PRN
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
## 2019-08-04 NOTE — Telephone Encounter (Signed)
Pt is requesting to be scheduled for an MRI.   Please advise, thanks.

## 2019-08-04 NOTE — Patient Instructions (Signed)
Laceration Care, Adult A laceration is a cut that may go through all layers of the skin. The cut may also go into the tissue that is right under the skin. Some cuts heal on their own. Others need to be closed with stitches (sutures), staples, skin adhesive strips, or skin glue. Taking care of your injury lowers your risk of infection, helps your injury to heal better, and may prevent scarring. Supplies needed:  Soap.  Water.  Hand sanitizer.  Bandage (dressing).  Antibiotic ointment.  Clean towel. How to take care of your cut Wash your hands with soap and water before touching your wound or changing your bandage. If soap and water are not available, use hand sanitizer. If your doctor used stitches or staples:  Keep the wound clean and dry.  If you were given a bandage, change it at least once a day as told by your doctor. You should also change it if it gets wet or dirty.  Keep the wound completely dry for the first 24 hours, or as told by your doctor. After that, you may take a shower or a bath. Do not get the wound soaked in water until after the stitches or staples have been removed.  Clean the wound once a day, or as told by your doctor: ? Wash the wound with soap and water. ? Rinse the wound with water to remove all soap. ? Pat the wound dry with a clean towel. Do not rub the wound.  After you clean the wound, put a thin layer of antibiotic ointment on it as told by your doctor. This ointment: ? Helps to prevent infection. ? Keeps the bandage from sticking to the wound.  Have your stitches or staples removed as told by your doctor. If your doctor used skin adhesive strips:  Keep the wound clean and dry.  If you were given a bandage, you should change it at least once a day as told by your doctor. You should also change it if it gets wet or dirty.  Do not get the skin adhesive strips wet. You can take a shower or a bath, but keep the wound dry.  If the wound gets wet,  pat it dry with a clean towel. Do not rub the wound.  Skin adhesive strips fall off on their own. You can trim the strips as the wound heals. Do not remove any strips that are still stuck to the wound. They will fall off after a while. If your doctor used skin glue:  Try to keep your wound dry, but you may briefly wet it in the shower or bath. Do not soak the wound in water, such as by swimming.  After you take a shower or a bath, gently pat the wound dry with a clean towel. Do not rub the wound.  Do not do any activities that will make you really sweaty until the skin glue has fallen off on its own.  Do not apply liquid, cream, or ointment medicine to your wound while the skin glue is still on.  If you were given a bandage, you should change it at least once a day or as told by your doctor. You should also change it if it gets dirty or wet.  If a bandage is placed over the wound, do not let the tape touch the skin glue.  Do not pick at the glue. The skin glue usually stays on for 5-10 days. Then, it falls off the skin. General   instructions   Take over-the-counter and prescription medicines only as told by your doctor.  If you were given antibiotic medicine or ointment, take or apply it as told by your doctor. Do not stop using it even if your condition improves.  Do not scratch or pick at the wound.  Check your wound every day for signs of infection. Watch for: ? Redness, swelling, or pain. ? Fluid, blood, or pus.  Raise (elevate) the injured area above the level of your heart while you are sitting or lying down.  If directed, put ice on the affected area: ? Put ice in a plastic bag. ? Place a towel between your skin and the bag. ? Leave the ice on for 20 minutes, 2-3 times a day.  Prevent scarring by covering your wound with sunscreen of at least 30 SPF whenever you are outside after your wound has healed.  Keep all follow-up visits as told by your doctor. This is important.  Get help if:  You got a tetanus shot and you have any of these problems at the injection site: ? Swelling. ? Very bad pain. ? Redness. ? Bleeding.  You have a fever.  A wound that was closed breaks open.  You notice a bad smell coming from your wound or your bandage.  You notice something coming out of the wound, such as wood or glass.  Medicine does not relieve your pain.  You have more redness, swelling, or pain at the site of your wound.  You have fluid, blood, or pus coming from your wound.  You notice a change in the color of your skin near your wound.  You need to change the bandage often because fluid, blood, or pus is coming from the wound.  You start to have a new rash.  You start to have numbness around the wound. Get help right away if:  You have very bad swelling around the wound.  Your pain suddenly gets worse and is very bad.  You notice painful lumps near the wound or anywhere on your body.  You have a red streak going away from your wound.  The wound is on your hand or foot, and: ? You cannot move a finger or toe. ? Your fingers or toes look pale or bluish. Summary  A laceration is a cut that may go through all layers of the skin. The cut may also go into the tissue right under the skin.  Some cuts heal on their own. Others need to be closed with stitches, staples, skin adhesive strips, or skin glue.  Follow your doctor's instructions for caring for your cut. Proper care of a cut lowers the risk of infection, helps the cut heal better, and prevents scarring. This information is not intended to replace advice given to you by your health care provider. Make sure you discuss any questions you have with your health care provider. Document Released: 02/25/2008 Document Revised: 11/06/2017 Document Reviewed: 09/28/2017 Elsevier Patient Education  2020 [employer].
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
## 2019-08-07 NOTE — Telephone Encounter (Signed)
Jordan [name], he has been scheduled and cancelled 3 times. Not sure what we need to do to get him rescheduled.

## 2019-08-09 ENCOUNTER — Other Ambulatory Visit
Admission: RE | Admit: 2019-08-09 | Discharge: 2019-08-09 | Disposition: A | Discharge status: Home / Self Care | Payer: Medicare Other | Source: Ambulatory Visit | Attending: Plastic Surgery | Admitting: Plastic Surgery

## 2019-08-09 DIAGNOSIS — Z20828 Contact with and (suspected) exposure to other viral communicable diseases: Secondary | ICD-10-CM | POA: Insufficient documentation

## 2019-08-09 DIAGNOSIS — Z01812 Encounter for preprocedural laboratory examination: Secondary | ICD-10-CM | POA: Insufficient documentation

## 2019-08-09 LAB — SARS CORONAVIRUS 2 (TAT 6-24 HRS): SARS Coronavirus 2: NEGATIVE

## 2019-08-10 ENCOUNTER — Encounter (HOSPITAL_COMMUNITY): Payer: Self-pay | Admitting: *Deleted

## 2019-08-10 ENCOUNTER — Telehealth: Payer: Self-pay

## 2019-08-10 ENCOUNTER — Other Ambulatory Visit: Payer: Self-pay

## 2019-08-10 MED ORDER — CEFAZOLIN SODIUM-DEXTROSE 2-4 GM/100ML-% IV SOLN
2.0000 g | INTRAVENOUS | Status: AC
  Administered 2019-08-11: 2 g via INTRAVENOUS
  Filled 2019-08-10: qty 100

## 2019-08-10 NOTE — Anesthesia Preprocedure Evaluation (Addendum)
Anesthesia Evaluation  Patient identified by MRN, date of birth, ID band Patient awake    Reviewed: Allergy & Precautions, NPO status , Patient's Chart, lab work & pertinent test results  History of Anesthesia Complications Negative for: history of anesthetic complications  Airway Mallampati: II  TM Distance: >3 FB Neck ROM: Full    Dental  (+) Dental Advisory Given   Pulmonary neg recent URI, Current Smoker and Patient abstained from smoking.,    breath sounds clear to auscultation       Cardiovascular hypertension, (-) angina(-) Past MI and (-) CHF  Rhythm:Regular     Neuro/Psych negative neurological ROS  negative psych ROS   GI/Hepatic negative GI ROS, (+)     substance abuse  alcohol use, Hepatitis -  Endo/Other  negative endocrine ROS  Renal/GU ESRF and DialysisRenal disease     Musculoskeletal   Abdominal   Peds  Hematology  (+) Blood dyscrasia, anemia ,   Anesthesia Other Findings   Reproductive/Obstetrics                            Anesthesia Physical Anesthesia Plan  ASA: III  Anesthesia Plan: General   Post-op Pain Management:    Induction: Intravenous  PONV Risk Score and Plan: 1 and Treatment may vary due to age or medical condition, Ondansetron and Dexamethasone  Airway Management Planned: Oral ETT  Additional Equipment: None  Intra-op Plan:   Post-operative Plan: Extubation in OR  Informed Consent: I have reviewed the patients History and Physical, chart, labs and discussed the procedure including the risks, benefits and alternatives for the proposed anesthesia with the patient or authorized representative who has indicated his/her understanding and acceptance.     Dental advisory given  Plan Discussed with: CRNA and Surgeon  Anesthesia Plan Comments: (PAT note written 08/10/2019 by Myra Gianotti, PA-C. )       Anesthesia Quick Evaluation

## 2019-08-10 NOTE — Telephone Encounter (Signed)
Everytime an order gets canceled you will need to place a New order. You cant reuse the order once it has been scheduled

## 2019-08-10 NOTE — Progress Notes (Signed)
Patient denies shortness of breath, fever, cough and chest pain.  PCP - Webb Silversmith, NP Cardiologist -  denies  Chest x-ray -07/22/19, 1 view EKG - 07/22/19 Stress Test - deneis ECHO - denies Cardiac Cath - denies  Anesthesia review: Yes  STOP now taking any Aspirin (unless otherwise instructed by your surgeon), Aleve, Naproxen, Ibuprofen, Motrin, Advil, Goody's, BC's, all herbal medications, fish oil, and all vitamins.   Coronavirus Screening Covid test on 08/09/19 was negative.  Patient verbalized understanding of instructions that were given via phone.

## 2019-08-10 NOTE — Telephone Encounter (Signed)
I have reordered. If he cancels again, I will not reorder.

## 2019-08-10 NOTE — Telephone Encounter (Signed)
Will need to wait until we see post op notes from Plastics
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
## 2019-08-10 NOTE — Addendum Note (Signed)
Addended by: Jearld Fenton on: 08/10/2019 09:38 AM   Modules accepted: Orders

## 2019-08-10 NOTE — Telephone Encounter (Signed)
Pt scheduled to have his nose reconstructed tomorrow.  He is to arrive@5  a.m.  When should pt have his u/s and MRI?

## 2019-08-11 ENCOUNTER — Encounter (HOSPITAL_COMMUNITY)
Admission: RE | Disposition: A | Discharge status: Home / Self Care | Payer: Self-pay | Source: Home / Self Care | Attending: Plastic Surgery

## 2019-08-11 ENCOUNTER — Ambulatory Visit (HOSPITAL_COMMUNITY)
Admission: RE | Admit: 2019-08-11 | Discharge: 2019-08-11 | Disposition: A | Discharge status: Home / Self Care | Payer: No Typology Code available for payment source | Attending: Plastic Surgery | Admitting: Plastic Surgery

## 2019-08-11 ENCOUNTER — Ambulatory Visit (HOSPITAL_COMMUNITY): Payer: No Typology Code available for payment source | Admitting: Certified Registered Nurse Anesthetist

## 2019-08-11 ENCOUNTER — Encounter (HOSPITAL_COMMUNITY): Payer: Self-pay

## 2019-08-11 ENCOUNTER — Ambulatory Visit: Payer: Medicare Other

## 2019-08-11 DIAGNOSIS — D649 Anemia, unspecified: Secondary | ICD-10-CM | POA: Insufficient documentation

## 2019-08-11 DIAGNOSIS — F17200 Nicotine dependence, unspecified, uncomplicated: Secondary | ICD-10-CM | POA: Insufficient documentation

## 2019-08-11 DIAGNOSIS — N186 End stage renal disease: Secondary | ICD-10-CM | POA: Insufficient documentation

## 2019-08-11 DIAGNOSIS — Z992 Dependence on renal dialysis: Secondary | ICD-10-CM | POA: Insufficient documentation

## 2019-08-11 DIAGNOSIS — I120 Hypertensive chronic kidney disease with stage 5 chronic kidney disease or end stage renal disease: Secondary | ICD-10-CM | POA: Diagnosis not present

## 2019-08-11 DIAGNOSIS — Z79899 Other long term (current) drug therapy: Secondary | ICD-10-CM | POA: Diagnosis not present

## 2019-08-11 DIAGNOSIS — S022XXA Fracture of nasal bones, initial encounter for closed fracture: Secondary | ICD-10-CM | POA: Diagnosis not present

## 2019-08-11 DIAGNOSIS — I739 Peripheral vascular disease, unspecified: Secondary | ICD-10-CM | POA: Insufficient documentation

## 2019-08-11 DIAGNOSIS — Y92410 Unspecified street and highway as the place of occurrence of the external cause: Secondary | ICD-10-CM | POA: Insufficient documentation

## 2019-08-11 HISTORY — DX: Male erectile dysfunction, unspecified: N52.9

## 2019-08-11 HISTORY — DX: Presence of dental prosthetic device (complete) (partial): Z97.2

## 2019-08-11 HISTORY — PX: CLOSED REDUCTION NASAL FRACTURE: SHX5365

## 2019-08-11 HISTORY — DX: Personal history of other medical treatment: Z92.89

## 2019-08-11 HISTORY — DX: Dyspnea, unspecified: R06.00

## 2019-08-11 LAB — CBC
HCT: 23.2 % — ABNORMAL LOW (ref 39.0–52.0)
Hemoglobin: 7.5 g/dL — ABNORMAL LOW (ref 13.0–17.0)
MCH: 33.3 pg (ref 26.0–34.0)
MCHC: 32.3 g/dL (ref 30.0–36.0)
MCV: 103.1 fL — ABNORMAL HIGH (ref 80.0–100.0)
Platelets: 194 10*3/uL (ref 150–400)
RBC: 2.25 MIL/uL — ABNORMAL LOW (ref 4.22–5.81)
RDW: 14.7 % (ref 11.5–15.5)
WBC: 7.4 10*3/uL (ref 4.0–10.5)
nRBC: 0 % (ref 0.0–0.2)

## 2019-08-11 LAB — BASIC METABOLIC PANEL
Anion gap: 19 — ABNORMAL HIGH (ref 5–15)
BUN: 44 mg/dL — ABNORMAL HIGH (ref 6–20)
CO2: 25 mmol/L (ref 22–32)
Calcium: 8.4 mg/dL — ABNORMAL LOW (ref 8.9–10.3)
Chloride: 98 mmol/L (ref 98–111)
Creatinine, Ser: 13.09 mg/dL — ABNORMAL HIGH (ref 0.61–1.24)
GFR calc Af Amer: 4 mL/min — ABNORMAL LOW (ref >60)
GFR calc non Af Amer: 4 mL/min — ABNORMAL LOW (ref >60)
Glucose, Bld: 88 mg/dL (ref 70–99)
Potassium: 4.3 mmol/L (ref 3.5–5.1)
Sodium: 142 mmol/L (ref 135–145)

## 2019-08-11 SURGERY — CLOSED REDUCTION, FRACTURE, NASAL BONE
Anesthesia: General | Site: Nose

## 2019-08-11 MED ORDER — LIDOCAINE-EPINEPHRINE 1 %-1:100000 IJ SOLN
INTRAMUSCULAR | Status: DC | PRN
  Administered 2019-08-11: 4 mL

## 2019-08-11 MED ORDER — ONDANSETRON HCL 4 MG/2ML IJ SOLN
INTRAMUSCULAR | Status: DC | PRN
  Administered 2019-08-11: 4 mg via INTRAVENOUS

## 2019-08-11 MED ORDER — DEXMEDETOMIDINE HCL 200 MCG/2ML IV SOLN
INTRAVENOUS | Status: DC | PRN
  Administered 2019-08-11: 8 ug via INTRAVENOUS

## 2019-08-11 MED ORDER — ONDANSETRON HCL 4 MG/2ML IJ SOLN
INTRAMUSCULAR | Status: AC
  Filled 2019-08-11: qty 2

## 2019-08-11 MED ORDER — OXYCODONE HCL 5 MG PO TABS
5.0000 mg | ORAL_TABLET | Freq: Once | ORAL | Status: AC | PRN
  Administered 2019-08-11: 5 mg via ORAL

## 2019-08-11 MED ORDER — BACITRACIN-NEOMYCIN-POLYMYXIN 400-5-5000 EX OINT
TOPICAL_OINTMENT | CUTANEOUS | Status: DC | PRN
  Administered 2019-08-11: 1 via TOPICAL

## 2019-08-11 MED ORDER — FENTANYL CITRATE (PF) 250 MCG/5ML IJ SOLN
INTRAMUSCULAR | Status: DC | PRN
  Administered 2019-08-11 (×2): 50 ug via INTRAVENOUS

## 2019-08-11 MED ORDER — SUCCINYLCHOLINE CHLORIDE 200 MG/10ML IV SOSY
PREFILLED_SYRINGE | INTRAVENOUS | Status: DC | PRN
  Administered 2019-08-11: 100 mg via INTRAVENOUS

## 2019-08-11 MED ORDER — FENTANYL CITRATE (PF) 250 MCG/5ML IJ SOLN
INTRAMUSCULAR | Status: AC
  Filled 2019-08-11: qty 5

## 2019-08-11 MED ORDER — OXYCODONE HCL 5 MG PO TABS
ORAL_TABLET | ORAL | Status: AC
  Filled 2019-08-11: qty 1

## 2019-08-11 MED ORDER — FENTANYL CITRATE (PF) 100 MCG/2ML IJ SOLN
INTRAMUSCULAR | Status: AC
  Filled 2019-08-11: qty 2

## 2019-08-11 MED ORDER — DEXMEDETOMIDINE HCL IN NACL 200 MCG/50ML IV SOLN
INTRAVENOUS | Status: AC
  Filled 2019-08-11: qty 50

## 2019-08-11 MED ORDER — MIDAZOLAM HCL 2 MG/2ML IJ SOLN
INTRAMUSCULAR | Status: AC
  Filled 2019-08-11: qty 2

## 2019-08-11 MED ORDER — LIDOCAINE 2% (20 MG/ML) 5 ML SYRINGE
INTRAMUSCULAR | Status: DC | PRN
  Administered 2019-08-11: 60 mg via INTRAVENOUS

## 2019-08-11 MED ORDER — CEPHALEXIN 500 MG PO CAPS: 500.0000 mg | ORAL_CAPSULE | Freq: Every day | ORAL | 0 refills | Status: AC

## 2019-08-11 MED ORDER — ACETAMINOPHEN 500 MG PO TABS: 1000.0000 mg | ORAL_TABLET | Freq: Once | ORAL | Status: DC | PRN

## 2019-08-11 MED ORDER — ACETAMINOPHEN 10 MG/ML IV SOLN: 1000.0000 mg | Freq: Once | INTRAVENOUS | Status: DC | PRN

## 2019-08-11 MED ORDER — OXYCODONE HCL 5 MG/5ML PO SOLN: 5.0000 mg | Freq: Once | ORAL | Status: AC | PRN

## 2019-08-11 MED ORDER — PROPOFOL 10 MG/ML IV BOLUS
INTRAVENOUS | Status: DC | PRN
  Administered 2019-08-11: 120 mg via INTRAVENOUS

## 2019-08-11 MED ORDER — 0.9 % SODIUM CHLORIDE (POUR BTL) OPTIME
TOPICAL | Status: DC | PRN
  Administered 2019-08-11: 1000 mL

## 2019-08-11 MED ORDER — DEXAMETHASONE SODIUM PHOSPHATE 10 MG/ML IJ SOLN
INTRAMUSCULAR | Status: DC | PRN
  Administered 2019-08-11: 10 mg via INTRAVENOUS

## 2019-08-11 MED ORDER — FENTANYL CITRATE (PF) 100 MCG/2ML IJ SOLN
25.0000 ug | INTRAMUSCULAR | Status: DC | PRN
  Administered 2019-08-11: 25 ug via INTRAVENOUS

## 2019-08-11 MED ORDER — LIDOCAINE 2% (20 MG/ML) 5 ML SYRINGE
INTRAMUSCULAR | Status: AC
  Filled 2019-08-11: qty 10

## 2019-08-11 MED ORDER — OXYMETAZOLINE HCL 0.05 % NA SOLN
NASAL | Status: DC | PRN
  Administered 2019-08-11: 1

## 2019-08-11 MED ORDER — PROPOFOL 10 MG/ML IV BOLUS
INTRAVENOUS | Status: AC
  Filled 2019-08-11: qty 20

## 2019-08-11 MED ORDER — BACITRACIN-NEOMYCIN-POLYMYXIN OINTMENT TUBE
TOPICAL_OINTMENT | CUTANEOUS | Status: AC
  Filled 2019-08-11: qty 14.17

## 2019-08-11 MED ORDER — LIDOCAINE-EPINEPHRINE 1 %-1:100000 IJ SOLN
INTRAMUSCULAR | Status: AC
  Filled 2019-08-11: qty 1

## 2019-08-11 MED ORDER — ACETAMINOPHEN 160 MG/5ML PO SOLN: 1000.0000 mg | Freq: Once | ORAL | Status: DC | PRN

## 2019-08-11 MED ORDER — SODIUM CHLORIDE 0.9 % IV SOLN
INTRAVENOUS | Status: DC | PRN
  Administered 2019-08-11: 07:00:00 via INTRAVENOUS

## 2019-08-11 MED ORDER — SUCCINYLCHOLINE CHLORIDE 200 MG/10ML IV SOSY
PREFILLED_SYRINGE | INTRAVENOUS | Status: AC
  Filled 2019-08-11: qty 10

## 2019-08-11 MED ORDER — OXYMETAZOLINE HCL 0.05 % NA SOLN
NASAL | Status: AC
  Filled 2019-08-11: qty 30

## 2019-08-11 MED ORDER — MIDAZOLAM HCL 5 MG/5ML IJ SOLN
INTRAMUSCULAR | Status: DC | PRN
  Administered 2019-08-11: 2 mg via INTRAVENOUS

## 2019-08-11 MED ORDER — DEXAMETHASONE SODIUM PHOSPHATE 10 MG/ML IJ SOLN
INTRAMUSCULAR | Status: AC
  Filled 2019-08-11: qty 1

## 2019-08-11 SURGICAL SUPPLY — 31 items
BLADE SURG 15 STRL LF DISP TIS (BLADE) IMPLANT
BLADE SURG 15 STRL SS (BLADE)
CANISTER SUCT 1200ML W/VALVE (MISCELLANEOUS) ×2 IMPLANT
COVER BACK TABLE 60X90IN (DRAPES) ×2 IMPLANT
COVER MAYO STAND STRL (DRAPES) ×1 IMPLANT
COVER WAND RF STERILE (DRAPES) ×1 IMPLANT
DRAPE HALF SHEET 40X57 (DRAPES) ×2 IMPLANT
GAUZE SPONGE 2X2 8PLY STRL LF (GAUZE/BANDAGES/DRESSINGS) ×1 IMPLANT
GAUZE SPONGE 4X4 16PLY XRAY LF (GAUZE/BANDAGES/DRESSINGS) ×1 IMPLANT
GLOVE BIO SURGEON STRL SZ 6 (GLOVE) ×2 IMPLANT
GLOVE BIOGEL PI IND STRL 6.5 (GLOVE) IMPLANT
GLOVE BIOGEL PI IND STRL 7.0 (GLOVE) IMPLANT
GLOVE BIOGEL PI INDICATOR 6.5 (GLOVE) ×1
GLOVE BIOGEL PI INDICATOR 7.0 (GLOVE) ×1
GOWN STRL NON-REIN LRG LVL3 (GOWN DISPOSABLE) ×2 IMPLANT
KIT BASIN OR (CUSTOM PROCEDURE TRAY) ×2 IMPLANT
KIT SPLINT NASAL DENVER LRG BE (GAUZE/BANDAGES/DRESSINGS) ×1 IMPLANT
KIT SPLINT NASAL DENVER MIN BE (GAUZE/BANDAGES/DRESSINGS) IMPLANT
KIT SPLINT NASAL DENVER PET BE (GAUZE/BANDAGES/DRESSINGS) IMPLANT
KIT SPLINT NASAL DENVER SM BEI (GAUZE/BANDAGES/DRESSINGS) IMPLANT
NDL PRECISIONGLIDE 27X1.5 (NEEDLE) IMPLANT
NEEDLE PRECISIONGLIDE 27X1.5 (NEEDLE) IMPLANT
PATTIES SURGICAL .5 X3 (DISPOSABLE) ×2 IMPLANT
SPLINT NASAL DOYLE BI-VL (GAUZE/BANDAGES/DRESSINGS) ×1 IMPLANT
SPLINT NASAL THERMO PLAST (MISCELLANEOUS) IMPLANT
SPONGE GAUZE 2X2 STER 10/PKG (GAUZE/BANDAGES/DRESSINGS) ×1
SUT CHROMIC 4 0 P 3 18 (SUTURE) IMPLANT
SUT PROLENE 2 0 CT2 30 (SUTURE) ×2 IMPLANT
SYR CONTROL 10ML LL (SYRINGE) ×1 IMPLANT
TOWEL GREEN STERILE FF (TOWEL DISPOSABLE) ×2 IMPLANT
TUBE CONNECTING 20X1/4 (TUBING) ×2 IMPLANT
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
## 2019-08-11 NOTE — Op Note (Signed)
Operative Note   DATE OF OPERATION: 11.19.20  LOCATION: [place] [facility]-outpatient  SURGICAL DIVISION: Plastic Surgery  PREOPERATIVE DIAGNOSES:  Closed fracture nasal bones and septum  POSTOPERATIVE DIAGNOSES:  same  PROCEDURE:  Closed reduction nasal bones and septum with stabilization  SURGEON: Irene Limbo MD MBA  ASSISTANT: none  ANESTHESIA:  General.   EBL: 5 ml  COMPLICATIONS: None immediate.   INDICATIONS FOR PROCEDURE:  The patient, Jordan [name], is a 53 y.o. male born on [birth year], is here for reduction nasal and septal fractures.   FINDINGS: Right caudal septal deviation reduced.  DESCRIPTION OF PROCEDURE:  The patient was taken to the operating room. SCDs were placed and IV antibiotics were given. A time out was performed and all information was confirmed to be correct. Nasal passages treated with Afrin soaked pledgets and local anesthetic infiltrated to perform bilateral supraorbital and infraorbital nerve blocks as well as across nasal dorsum membranous septum. Patient draped in usual fashion. Pledgets removed. Nasal septum reduced with nasal speculum. Blunt elevator used to elevated nasal bones and reduced. Doyle splints secured to membranous septum with 2-0 prolene. Denver splint applied to nasal dorsum. Nasopharynx suctioned.  The patient was allowed to wake from anesthesia, extubated and taken to the recovery room in satisfactory condition.   SPECIMENS: none  DRAINS: none  Irene Limbo, MD [facility] Plastic & Reconstructive Surgery

## 2019-08-11 NOTE — Transfer of Care (Signed)
Immediate Anesthesia Transfer of Care Note  Patient: Jordan [name]  Procedure(s) Performed: CLOSED REDUCTION NASAL FRACTURE WITH STABILIZATION (N/A Nose)  Patient Location: PACU  Anesthesia Type:General  Level of Consciousness: awake, alert  and oriented  Airway & Oxygen Therapy: Patient Spontanous Breathing and Patient connected to face mask oxygen  Post-op Assessment: Report given to RN, Post -op Vital signs reviewed and stable and Patient moving all extremities X 4  Post vital signs: Reviewed and stable  Last Vitals:  Vitals Value Taken Time  BP    Temp    Pulse 100 08/11/19 0750  Resp 13 08/11/19 0750  SpO2 100 % 08/11/19 0750  Vitals shown include unvalidated device data.  Last Pain:  Vitals:   08/11/19 [id number]  PainSc: 0-No pain         Complications: No apparent anesthesia complications

## 2019-08-11 NOTE — Interval H&P Note (Signed)
History and Physical Interval Note:  08/11/2019 5:46 AM  Jordan [name]  has presented today for surgery, with the diagnosis of closed nasal fracture, closed septal fracture.  The various methods of treatment have been discussed with the patient and family. After consideration of risks, benefits and other options for treatment, the patient has consented to  Procedure(s): CLOSED REDUCTION NASAL FRACTURE WITH STABILIZATION (N/A) as a surgical intervention.  The patient's history has been reviewed, patient examined, no change in status, stable for surgery.  I have reviewed the patient's chart and labs.  Questions were answered to the patient's satisfaction.     Arnoldo Hooker Tenise Stetler

## 2019-08-11 NOTE — Anesthesia Procedure Notes (Signed)
Procedure Name: Intubation Date/Time: 08/11/2019 7:22 AM Performed by: Mariea Clonts, CRNA Pre-anesthesia Checklist: Patient identified, Emergency Drugs available, Suction available and Patient being monitored Patient Re-evaluated:Patient Re-evaluated prior to induction Oxygen Delivery Method: Circle System Utilized Preoxygenation: Pre-oxygenation with 100% oxygen Induction Type: IV induction Ventilation: Mask ventilation without difficulty Laryngoscope Size: Mac and 3 Grade View: Grade II Tube type: Oral Tube size: 7.5 mm Number of attempts: 1 Airway Equipment and Method: Stylet and Oral airway Placement Confirmation: ETT inserted through vocal cords under direct vision,  positive ETCO2 and breath sounds checked- equal and bilateral Tube secured with: Tape Dental Injury: Teeth and Oropharynx as per pre-operative assessment

## 2019-08-12 ENCOUNTER — Encounter (HOSPITAL_COMMUNITY): Payer: Self-pay | Admitting: Plastic Surgery

## 2019-08-14 NOTE — Anesthesia Postprocedure Evaluation (Signed)
Anesthesia Post Note  Patient: Jordan [name]  Procedure(s) Performed: CLOSED REDUCTION NASAL FRACTURE WITH STABILIZATION (N/A Nose)     Patient location during evaluation: PACU Anesthesia Type: General Level of consciousness: awake and alert Pain management: pain level controlled Vital Signs Assessment: post-procedure vital signs reviewed and stable Respiratory status: spontaneous breathing, nonlabored ventilation, respiratory function stable and patient connected to nasal cannula oxygen Cardiovascular status: blood pressure returned to baseline and stable Postop Assessment: no apparent nausea or vomiting Anesthetic complications: no    Last Vitals:  Vitals:   08/11/19 0849 08/11/19 0850  BP: (!) 171/92   Pulse: 93 92  Resp: 12 12  Temp:  36.8 C  SpO2: 95% 96%    Last Pain:  Vitals:   08/11/19 0820  PainSc: Asleep                 Trula Frede

## 2019-08-22 ENCOUNTER — Telehealth: Payer: Self-pay

## 2019-08-22 NOTE — Telephone Encounter (Signed)
Pt states he doesn't have the metal splint on his nose anymore and doesn't think they placed anything metal during nasal surgery.  He is ready to do his MRI.

## 2019-08-22 NOTE — Telephone Encounter (Signed)
It is ordered, just needs to be scheduled

## 2019-08-24 ENCOUNTER — Ambulatory Visit: Payer: Medicare Other

## 2019-09-15 ENCOUNTER — Ambulatory Visit: Admission: RE | Admit: 2019-09-15 | Payer: Medicare Other | Source: Ambulatory Visit

## 2019-09-19 ENCOUNTER — Other Ambulatory Visit: Payer: Self-pay

## 2019-09-19 ENCOUNTER — Encounter: Payer: Self-pay | Admitting: *Deleted

## 2019-09-19 ENCOUNTER — Inpatient Hospital Stay
Admission: EM | Admit: 2019-09-19 | Discharge: 2019-09-22 | DRG: 811 | Disposition: A | Discharge status: Home / Self Care | Payer: Medicare Other | Attending: Internal Medicine | Admitting: Internal Medicine

## 2019-09-19 DIAGNOSIS — Z20828 Contact with and (suspected) exposure to other viral communicable diseases: Secondary | ICD-10-CM | POA: Diagnosis present

## 2019-09-19 DIAGNOSIS — F17210 Nicotine dependence, cigarettes, uncomplicated: Secondary | ICD-10-CM | POA: Diagnosis present

## 2019-09-19 DIAGNOSIS — I10 Essential (primary) hypertension: Secondary | ICD-10-CM | POA: Diagnosis not present

## 2019-09-19 DIAGNOSIS — D649 Anemia, unspecified: Secondary | ICD-10-CM | POA: Diagnosis not present

## 2019-09-19 DIAGNOSIS — L299 Pruritus, unspecified: Secondary | ICD-10-CM | POA: Diagnosis present

## 2019-09-19 DIAGNOSIS — Z992 Dependence on renal dialysis: Secondary | ICD-10-CM

## 2019-09-19 DIAGNOSIS — R9431 Abnormal electrocardiogram [ECG] [EKG]: Secondary | ICD-10-CM | POA: Diagnosis present

## 2019-09-19 DIAGNOSIS — D62 Acute posthemorrhagic anemia: Secondary | ICD-10-CM | POA: Diagnosis not present

## 2019-09-19 DIAGNOSIS — N186 End stage renal disease: Secondary | ICD-10-CM | POA: Diagnosis not present

## 2019-09-19 DIAGNOSIS — D631 Anemia in chronic kidney disease: Secondary | ICD-10-CM | POA: Diagnosis present

## 2019-09-19 DIAGNOSIS — Z841 Family history of disorders of kidney and ureter: Secondary | ICD-10-CM

## 2019-09-19 DIAGNOSIS — Z66 Do not resuscitate: Secondary | ICD-10-CM | POA: Diagnosis present

## 2019-09-19 DIAGNOSIS — D696 Thrombocytopenia, unspecified: Secondary | ICD-10-CM | POA: Diagnosis present

## 2019-09-19 DIAGNOSIS — R2241 Localized swelling, mass and lump, right lower limb: Secondary | ICD-10-CM

## 2019-09-19 DIAGNOSIS — Z833 Family history of diabetes mellitus: Secondary | ICD-10-CM

## 2019-09-19 DIAGNOSIS — B1910 Unspecified viral hepatitis B without hepatic coma: Secondary | ICD-10-CM | POA: Diagnosis present

## 2019-09-19 LAB — IRON AND TIBC
Iron: 110 ug/dL (ref 45–182)
Saturation Ratios: 32 % (ref 17.9–39.5)
TIBC: 340 ug/dL (ref 250–450)
UIBC: 230 ug/dL

## 2019-09-19 LAB — COMPREHENSIVE METABOLIC PANEL
ALT: 10 U/L (ref 0–44)
AST: 16 U/L (ref 15–41)
Albumin: 3.6 g/dL (ref 3.5–5.0)
Alkaline Phosphatase: 122 U/L (ref 38–126)
Anion gap: 14 (ref 5–15)
BUN: 37 mg/dL — ABNORMAL HIGH (ref 6–20)
CO2: 30 mmol/L (ref 22–32)
Calcium: 8.3 mg/dL — ABNORMAL LOW (ref 8.9–10.3)
Chloride: 96 mmol/L — ABNORMAL LOW (ref 98–111)
Creatinine, Ser: 9.67 mg/dL — ABNORMAL HIGH (ref 0.61–1.24)
GFR calc Af Amer: 6 mL/min — ABNORMAL LOW (ref >60)
GFR calc non Af Amer: 6 mL/min — ABNORMAL LOW (ref >60)
Glucose, Bld: 82 mg/dL (ref 70–99)
Potassium: 3.7 mmol/L (ref 3.5–5.1)
Sodium: 140 mmol/L (ref 135–145)
Total Bilirubin: 1.5 mg/dL — ABNORMAL HIGH (ref 0.3–1.2)
Total Protein: 7.5 g/dL (ref 6.5–8.1)

## 2019-09-19 LAB — CBC WITH DIFFERENTIAL/PLATELET
Abs Immature Granulocytes: 0.07 10*3/uL (ref 0.00–0.07)
Basophils Absolute: 0.1 10*3/uL (ref 0.0–0.1)
Basophils Relative: 1 %
Eosinophils Absolute: 0.6 10*3/uL — ABNORMAL HIGH (ref 0.0–0.5)
Eosinophils Relative: 7 %
HCT: 21.1 % — ABNORMAL LOW (ref 39.0–52.0)
Hemoglobin: 6.7 g/dL — ABNORMAL LOW (ref 13.0–17.0)
Immature Granulocytes: 1 %
Lymphocytes Relative: 16 %
Lymphs Abs: 1.3 10*3/uL (ref 0.7–4.0)
MCH: 34.2 pg — ABNORMAL HIGH (ref 26.0–34.0)
MCHC: 31.8 g/dL (ref 30.0–36.0)
MCV: 107.7 fL — ABNORMAL HIGH (ref 80.0–100.0)
Monocytes Absolute: 0.6 10*3/uL (ref 0.1–1.0)
Monocytes Relative: 7 %
Neutro Abs: 5.6 10*3/uL (ref 1.7–7.7)
Neutrophils Relative %: 68 %
Platelets: 170 10*3/uL (ref 150–400)
RBC: 1.96 MIL/uL — ABNORMAL LOW (ref 4.22–5.81)
RDW: 13.7 % (ref 11.5–15.5)
WBC: 8.2 10*3/uL (ref 4.0–10.5)
nRBC: 0 % (ref 0.0–0.2)

## 2019-09-19 LAB — PHOSPHORUS: Phosphorus: 3.8 mg/dL (ref 2.5–4.6)

## 2019-09-19 LAB — RETICULOCYTES
Immature Retic Fract: 20.2 % — ABNORMAL HIGH (ref 2.3–15.9)
RBC.: 1.89 MIL/uL — ABNORMAL LOW (ref 4.22–5.81)
Retic Count, Absolute: 90.9 10*3/uL (ref 19.0–186.0)
Retic Ct Pct: 4.8 % — ABNORMAL HIGH (ref 0.4–3.1)

## 2019-09-19 LAB — FOLATE: Folate: 8.9 ng/mL (ref >5.9)

## 2019-09-19 LAB — VITAMIN B12: Vitamin B-12: 262 pg/mL (ref 180–914)

## 2019-09-19 LAB — MAGNESIUM: Magnesium: 2.1 mg/dL (ref 1.7–2.4)

## 2019-09-19 LAB — FERRITIN: Ferritin: 727 ng/mL — ABNORMAL HIGH (ref 24–336)

## 2019-09-19 MED ORDER — HYDROXYZINE HCL 25 MG PO TABS
25.0000 mg | ORAL_TABLET | Freq: Once | ORAL | Status: AC
  Administered 2019-09-19: 25 mg via ORAL
  Filled 2019-09-19: qty 1

## 2019-09-19 MED ORDER — FAMOTIDINE IN NACL 20-0.9 MG/50ML-% IV SOLN
20.0000 mg | Freq: Once | INTRAVENOUS | Status: AC
  Administered 2019-09-19: 20 mg via INTRAVENOUS
  Filled 2019-09-19: qty 50

## 2019-09-19 MED ORDER — SODIUM CHLORIDE 0.9 % IV SOLN
10.0000 mL/h | Freq: Once | INTRAVENOUS | Status: AC
  Administered 2019-09-20: 10 mL/h via INTRAVENOUS

## 2019-09-19 MED ORDER — TRIAMCINOLONE ACETONIDE 0.5 % EX OINT
TOPICAL_OINTMENT | Freq: Two times a day (BID) | CUTANEOUS | Status: DC
  Administered 2019-09-20: 1 via TOPICAL
  Filled 2019-09-19: qty 15

## 2019-09-19 NOTE — ED Provider Notes (Signed)
[facility] EMERGENCY DEPARTMENT Provider Note   CSN: [id number] Arrival date & time: 09/19/19  1322     History Chief Complaint  Patient presents with  . Pruritis    Jordan [name] is a 53 y.o. male.  HPI   53 yo M with PMhx CKD, anemia here with weakness, pruritis. Pt reports his pruritis has worsened over the past 2-3 days. H/o intermittent chronic pruritis but is much worse, and acutely worsened with HD today. He itches primarily in his legs and bl elbows, but also diffusely. No wheezing, lip swelling, or mouth lesions. He denies any new allergen exposures, clothes, drugs. He has been seen before for this. Does not feel like PO benadryl is helping. No pain. Denies known insect bites. No known tick bites.   Pt also c/o mild worsening of chronic fatigue along with SOB, worse with exertion. No alleviating factors. No CP, lightheadedness. Denies any blood loss in his stools.      Past Medical History:  Diagnosis Date  . Anemia   . CKD (chronic kidney disease)    Stage 5  Dialysis - M/W/F in [place], [place]  . Dyspnea    tx with inhaler when sick  . ED (erectile dysfunction)   . ETOH abuse   . History of blood transfusion   . Hypertension   . Wears dentures     Patient Active Problem List   Diagnosis Date Noted  . Anemia in ESRD (end-stage renal disease) ([place]) 05/17/2018  . Hepatitis B 03/19/2018  . ESRD on dialysis ([place]) 05/26/2017  . Essential hypertension 05/26/2017    Past Surgical History:  Procedure Laterality Date  . BASCILIC VEIN TRANSPOSITION Left 08/07/2016   Procedure: LEFT BASILIC VEIN TRANSPOSITION;  Surgeon: Angelia Mould, MD;  Location: [facility];  Service: Vascular;  Laterality: Left;  . CLOSED REDUCTION NASAL FRACTURE N/A 08/11/2019   Procedure: CLOSED REDUCTION NASAL FRACTURE WITH STABILIZATION;  Surgeon: Irene Limbo, MD;  Location: [facility];  Service: Plastics;  Laterality: N/A;  . COLONOSCOPY N/A 08/12/2016   Procedure: COLONOSCOPY;  Surgeon: Otis Brace, MD;  Location: [facility];  Service: Gastroenterology;  Laterality: N/A;  . ESOPHAGOGASTRODUODENOSCOPY N/A 08/12/2016   Procedure: ESOPHAGOGASTRODUODENOSCOPY (EGD);  Surgeon: Otis Brace, MD;  Location: [facility];  Service: Gastroenterology;  Laterality: N/A;  . INSERTION OF DIALYSIS CATHETER N/A 08/07/2016   Procedure: INSERTION OF TUNNELED DIALYSIS CATHETER;  Surgeon: Angelia Mould, MD;  Location: [facility];  Service: Vascular;  Laterality: N/A;  . LIGATION OF ARTERIOVENOUS  FISTULA Left 09/12/2016   Procedure: BANDING OF LEFT  ARTERIOVENOUS  FISTULA;  Surgeon: Angelia Mould, MD;  Location: [facility];  Service: Vascular;  Laterality: Left;  . LOWER EXTREMITY INTERVENTION Right 12/02/2018   Procedure: LOWER EXTREMITY INTERVENTION;  Surgeon: Algernon Huxley, MD;  Location: [facility];  Service: Cardiovascular;  Laterality: Right;       Family History  Problem Relation Age of Onset  . Diabetes Mother   . Kidney failure Mother   . Kidney failure Brother   . Post-traumatic stress disorder Neg Hx   . Bladder Cancer Neg Hx   . Kidney cancer Neg Hx     Social History   Tobacco Use  . Smoking status: Current Every Day Smoker    Packs/day: 0.50    Years: 15.00    Pack years: 7.50    Types: Cigarettes  . Smokeless tobacco: Never Used  Substance Use Topics  . Alcohol use: Yes  Alcohol/week: 21.0 standard drinks    Types: 21 Cans of beer per week    Comment: none since 08/04/16  . Drug use: No    Home Medications Prior to Admission medications   Medication Sig Start Date End Date Taking? Authorizing Provider  albuterol (PROVENTIL HFA;VENTOLIN HFA) 108 (90 Base) MCG/ACT inhaler Inhale 2 puffs into the lungs every 4 (four) hours as needed for wheezing or shortness of breath. 10/03/18  Yes Pollina, Gwenyth Allegra, MD  calcium acetate (PHOSLO) 667 MG capsule Take 2 capsules (1,334 mg total) by mouth 3 (three)  times daily with meals. Patient taking differently: Take [phone] mg by mouth See admin instructions. Take [phone] with each meal take 667 mg with each snack 02/12/19  Yes Fritzi Mandes, MD  hydrOXYzine (ATARAX/VISTARIL) 50 MG tablet Take 50 mg by mouth 3 (three) times daily. 08/09/19  Yes [provider]  sildenafil (VIAGRA) 50 MG tablet Take 50 mg by mouth daily as needed for erectile dysfunction. 07/20/19  Yes [provider]    Allergies    Patient has no known allergies.  Review of Systems   Review of Systems  Constitutional: Positive for fatigue. Negative for chills and fever.  HENT: Negative for sore throat.   Respiratory: Positive for shortness of breath.   Cardiovascular: Negative for chest pain.  Gastrointestinal: Negative for abdominal pain.  Genitourinary: Negative for flank pain.  Musculoskeletal: Negative for neck pain.  Skin: Positive for rash. Negative for wound.  Allergic/Immunologic: Negative for immunocompromised state.  Neurological: Positive for weakness. Negative for numbness.  Hematological: Does not bruise/bleed easily.  All other systems reviewed and are negative.   Physical Exam Updated Vital Signs BP (!) 165/90 (BP Location: Right Arm)   Pulse (!) 103   Temp 98.4 F (36.9 C) (Oral)   Resp 20   Ht 5\' 4"  (1.626 m)   Wt 70.3 kg   SpO2 96%   BMI 26.61 kg/m   Physical Exam Vitals and nursing note reviewed.  Constitutional:      General: He is not in acute distress.    Appearance: He is well-developed.  HENT:     Head: Normocephalic and atraumatic.  Eyes:     Conjunctiva/sclera: Conjunctivae normal.  Cardiovascular:     Rate and Rhythm: Normal rate and regular rhythm.     Heart sounds: Normal heart sounds.  Pulmonary:     Effort: Pulmonary effort is normal. No respiratory distress.     Breath sounds: No wheezing.  Abdominal:     General: Abdomen is flat. There is no distension.  Musculoskeletal:     Cervical back: Neck  supple.  Skin:    General: Skin is warm.     Capillary Refill: Capillary refill takes less than 2 seconds.     Findings: Rash present.     Comments: Urticarial rash noted to posterior thighs, bilateral upper arms/elbows. Secondary excoriations to posterior thighs. Mild erythema of face. No swelling of lips.   Neurological:     Mental Status: He is alert and oriented to person, place, and time.     Motor: No abnormal muscle tone.     ED Results / Procedures / Treatments   Labs (all labs ordered are listed, but only abnormal results are displayed) Labs Reviewed  CBC WITH DIFFERENTIAL/PLATELET - Abnormal; Notable for the following components:      Result Value   RBC 1.96 (*)    Hemoglobin 6.7 (*)    HCT 21.1 (*)  MCV 107.7 (*)    MCH 34.2 (*)    Eosinophils Absolute 0.6 (*)    All other components within normal limits  COMPREHENSIVE METABOLIC PANEL - Abnormal; Notable for the following components:   Chloride 96 (*)    BUN 37 (*)    Creatinine, Ser 9.67 (*)    Calcium 8.3 (*)    Total Bilirubin 1.5 (*)    GFR calc non Af Amer 6 (*)    GFR calc Af Amer 6 (*)    All other components within normal limits  FERRITIN - Abnormal; Notable for the following components:   Ferritin 727 (*)    All other components within normal limits  RETICULOCYTES - Abnormal; Notable for the following components:   Retic Ct Pct 4.8 (*)    RBC. 1.89 (*)    Immature Retic Fract 20.2 (*)    All other components within normal limits  SARS CORONAVIRUS 2 (TAT 6-24 HRS)  MAGNESIUM  PHOSPHORUS  FOLATE  IRON AND TIBC  VITAMIN B12  PREPARE RBC (CROSSMATCH)  TYPE AND SCREEN    EKG Normal sinus rhythm, VR 96. PR 114, QRS 84, QTc 523. No acute st elevations or depressions.   Radiology No results found.  Procedures .Critical Care Performed by: Duffy Bruce, MD Authorized by: Duffy Bruce, MD   Critical care provider statement:    Critical care time (minutes):  35   Critical care time was  exclusive of:  Separately billable procedures and treating other patients and teaching time   Critical care was necessary to treat or prevent imminent or life-threatening deterioration of the following conditions:  Cardiac failure and circulatory failure   Critical care was time spent personally by me on the following activities:  Development of treatment plan with patient or surrogate, discussions with consultants, evaluation of patient's response to treatment, examination of patient, obtaining history from patient or surrogate, ordering and performing treatments and interventions, ordering and review of laboratory studies, ordering and review of radiographic studies, pulse oximetry, re-evaluation of patient's condition and review of old charts   I assumed direction of critical care for this patient from another provider in my specialty: no     (including critical care time)  Medications Ordered in ED Medications  0.9 %  sodium chloride infusion (has no administration in time range)  triamcinolone ointment (KENALOG) 0.5 % ( Topical Given 09/19/19 2122)  famotidine (PEPCID) IVPB 20 mg premix (0 mg Intravenous Stopped 09/19/19 1908)  hydrOXYzine (ATARAX/VISTARIL) tablet 25 mg (25 mg Oral Given 09/19/19 1820)    ED Course  I have reviewed the triage vital signs and the nursing notes.  Pertinent labs & imaging results that were available during my care of the patient were reviewed by me and considered in my medical decision making (see chart for details).  Jordan [name] was evaluated in Emergency Department on 09/19/2019 for the symptoms described in the history of present illness. He was evaluated in the context of the global COVID-19 pandemic, which necessitated consideration that the patient might be at risk for infection with the SARS-CoV-2 virus that causes COVID-19. Institutional protocols and algorithms that pertain to the evaluation of patients at risk for COVID-19 are in a state of rapid  change based on information released by regulatory bodies including the CDC and federal and state organizations. These policies and algorithms were followed during the patient's care in the ED.    MDM Rules/Calculators/A&P  53 yo M here with urticaria, pruritis. History of recurrent urticaria. Unclear etiology - could be idiopathic, viral, related to one of his medications, or possibly uremic. Rash is not c/w SJS/TEN. No signs of secondary system involvement. Anithistamines given.  Pt also reporting SOB. Hgb 6.7. He has a h/o chronic anemia from his renal disease, but will need slow transfusion with monitoring. Will admit.  Final Clinical Impression(s) / ED Diagnoses Final diagnoses:  Acute on chronic anemia  Chronic pruritus    Rx / DC Orders ED Discharge Orders    None       Duffy Bruce, MD 09/19/19 2230

## 2019-09-19 NOTE — ED Triage Notes (Signed)
PT reports feeling itchy all over x 2 days. Pt unsure if he was having an allergic reaction but reports the PO benadryl at home has not been working. NO hives noted upon assessment in triage but pt reports itching on abd and both legs.

## 2019-09-19 NOTE — ED Notes (Signed)
Vladimir Crofts, NP in with pt at this time.

## 2019-09-19 NOTE — ED Notes (Signed)
Report given to Butch RN 

## 2019-09-19 NOTE — ED Notes (Signed)
Pt is resting.  He states the rash on his bottom is itching a lot, and when he touches it, he experiences pain of 8/10.  He requested a meal tray and drink which was provided.  Toileting offered, bed locked in lowest position, call bell within reach.  No other needs expressed at this time.

## 2019-09-19 NOTE — ED Notes (Signed)
See triage note  Presents with itching all over  States sxs' started about 2 days ago  Tried PO benadryl w/o relief
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
## 2019-09-20 DIAGNOSIS — Z992 Dependence on renal dialysis: Secondary | ICD-10-CM

## 2019-09-20 DIAGNOSIS — R2241 Localized swelling, mass and lump, right lower limb: Secondary | ICD-10-CM | POA: Diagnosis not present

## 2019-09-20 DIAGNOSIS — Z841 Family history of disorders of kidney and ureter: Secondary | ICD-10-CM | POA: Diagnosis not present

## 2019-09-20 DIAGNOSIS — L299 Pruritus, unspecified: Secondary | ICD-10-CM | POA: Diagnosis present

## 2019-09-20 DIAGNOSIS — D649 Anemia, unspecified: Secondary | ICD-10-CM

## 2019-09-20 DIAGNOSIS — D62 Acute posthemorrhagic anemia: Secondary | ICD-10-CM | POA: Diagnosis present

## 2019-09-20 DIAGNOSIS — D539 Nutritional anemia, unspecified: Secondary | ICD-10-CM | POA: Diagnosis not present

## 2019-09-20 DIAGNOSIS — F17210 Nicotine dependence, cigarettes, uncomplicated: Secondary | ICD-10-CM | POA: Diagnosis present

## 2019-09-20 DIAGNOSIS — Z833 Family history of diabetes mellitus: Secondary | ICD-10-CM | POA: Diagnosis not present

## 2019-09-20 DIAGNOSIS — Z20828 Contact with and (suspected) exposure to other viral communicable diseases: Secondary | ICD-10-CM | POA: Diagnosis present

## 2019-09-20 DIAGNOSIS — B1910 Unspecified viral hepatitis B without hepatic coma: Secondary | ICD-10-CM | POA: Diagnosis present

## 2019-09-20 DIAGNOSIS — D631 Anemia in chronic kidney disease: Secondary | ICD-10-CM | POA: Diagnosis present

## 2019-09-20 DIAGNOSIS — D696 Thrombocytopenia, unspecified: Secondary | ICD-10-CM | POA: Diagnosis present

## 2019-09-20 DIAGNOSIS — N186 End stage renal disease: Secondary | ICD-10-CM | POA: Diagnosis present

## 2019-09-20 DIAGNOSIS — Z66 Do not resuscitate: Secondary | ICD-10-CM | POA: Diagnosis present

## 2019-09-20 LAB — CBC
HCT: 20.8 % — ABNORMAL LOW (ref 39.0–52.0)
Hemoglobin: 6.5 g/dL — ABNORMAL LOW (ref 13.0–17.0)
MCH: 33.5 pg (ref 26.0–34.0)
MCHC: 31.3 g/dL (ref 30.0–36.0)
MCV: 107.2 fL — ABNORMAL HIGH (ref 80.0–100.0)
Platelets: 160 10*3/uL (ref 150–400)
RBC: 1.94 MIL/uL — ABNORMAL LOW (ref 4.22–5.81)
RDW: 14.3 % (ref 11.5–15.5)
WBC: 7.5 10*3/uL (ref 4.0–10.5)
nRBC: 0 % (ref 0.0–0.2)

## 2019-09-20 LAB — COMPREHENSIVE METABOLIC PANEL
ALT: 9 U/L (ref 0–44)
AST: 15 U/L (ref 15–41)
Albumin: 3.4 g/dL — ABNORMAL LOW (ref 3.5–5.0)
Alkaline Phosphatase: 128 U/L — ABNORMAL HIGH (ref 38–126)
Anion gap: 16 — ABNORMAL HIGH (ref 5–15)
BUN: 46 mg/dL — ABNORMAL HIGH (ref 6–20)
CO2: 28 mmol/L (ref 22–32)
Calcium: 8.1 mg/dL — ABNORMAL LOW (ref 8.9–10.3)
Chloride: 98 mmol/L (ref 98–111)
Creatinine, Ser: 11.25 mg/dL — ABNORMAL HIGH (ref 0.61–1.24)
GFR calc Af Amer: 5 mL/min — ABNORMAL LOW (ref >60)
GFR calc non Af Amer: 5 mL/min — ABNORMAL LOW (ref >60)
Glucose, Bld: 98 mg/dL (ref 70–99)
Potassium: 4.2 mmol/L (ref 3.5–5.1)
Sodium: 142 mmol/L (ref 135–145)
Total Bilirubin: 1.3 mg/dL — ABNORMAL HIGH (ref 0.3–1.2)
Total Protein: 7.1 g/dL (ref 6.5–8.1)

## 2019-09-20 LAB — HIV ANTIBODY (ROUTINE TESTING W REFLEX): HIV Screen 4th Generation wRfx: NONREACTIVE

## 2019-09-20 LAB — HEMOGLOBIN AND HEMATOCRIT, BLOOD
HCT: 28 % — ABNORMAL LOW (ref 39.0–52.0)
Hemoglobin: 9.1 g/dL — ABNORMAL LOW (ref 13.0–17.0)

## 2019-09-20 LAB — PHOSPHORUS: Phosphorus: 5 mg/dL — ABNORMAL HIGH (ref 2.5–4.6)

## 2019-09-20 LAB — CBC WITH DIFFERENTIAL/PLATELET
Abs Immature Granulocytes: 0.07 10*3/uL (ref 0.00–0.07)
Basophils Absolute: 0 10*3/uL (ref 0.0–0.1)
Basophils Relative: 0 %
Eosinophils Absolute: 0.6 10*3/uL — ABNORMAL HIGH (ref 0.0–0.5)
Eosinophils Relative: 9 %
HCT: 20 % — ABNORMAL LOW (ref 39.0–52.0)
Hemoglobin: 6.4 g/dL — ABNORMAL LOW (ref 13.0–17.0)
Immature Granulocytes: 1 %
Lymphocytes Relative: 16 %
Lymphs Abs: 1.1 10*3/uL (ref 0.7–4.0)
MCH: 34 pg (ref 26.0–34.0)
MCHC: 32 g/dL (ref 30.0–36.0)
MCV: 106.4 fL — ABNORMAL HIGH (ref 80.0–100.0)
Monocytes Absolute: 0.5 10*3/uL (ref 0.1–1.0)
Monocytes Relative: 7 %
Neutro Abs: 4.8 10*3/uL (ref 1.7–7.7)
Neutrophils Relative %: 67 %
Platelets: 157 10*3/uL (ref 150–400)
RBC: 1.88 MIL/uL — ABNORMAL LOW (ref 4.22–5.81)
RDW: 14.2 % (ref 11.5–15.5)
WBC: 7.1 10*3/uL (ref 4.0–10.5)
nRBC: 0 % (ref 0.0–0.2)

## 2019-09-20 LAB — SARS CORONAVIRUS 2 (TAT 6-24 HRS): SARS Coronavirus 2: NEGATIVE

## 2019-09-20 LAB — PREPARE RBC (CROSSMATCH)

## 2019-09-20 LAB — MAGNESIUM: Magnesium: 2.1 mg/dL (ref 1.7–2.4)

## 2019-09-20 LAB — MRSA PCR SCREENING: MRSA by PCR: NEGATIVE

## 2019-09-20 LAB — TSH: TSH: 0.665 u[IU]/mL (ref 0.350–4.500)

## 2019-09-20 MED ORDER — SODIUM CHLORIDE 0.9% FLUSH: 3.0000 mL | INTRAVENOUS | Status: DC | PRN

## 2019-09-20 MED ORDER — ORAL CARE MOUTH RINSE
15.0000 mL | Freq: Two times a day (BID) | OROMUCOSAL | Status: DC
  Administered 2019-09-20 – 2019-09-21 (×2): 15 mL via OROMUCOSAL

## 2019-09-20 MED ORDER — NICOTINE 21 MG/24HR TD PT24
21.0000 mg | MEDICATED_PATCH | Freq: Once | TRANSDERMAL | Status: AC
  Administered 2019-09-20: 21 mg via TRANSDERMAL
  Filled 2019-09-20: qty 1

## 2019-09-20 MED ORDER — DIPHENHYDRAMINE HCL 50 MG/ML IJ SOLN
25.0000 mg | Freq: Once | INTRAMUSCULAR | Status: AC
  Administered 2019-09-20: 25 mg via INTRAVENOUS
  Filled 2019-09-20: qty 1

## 2019-09-20 MED ORDER — DIPHENHYDRAMINE HCL 25 MG PO CAPS
25.0000 mg | ORAL_CAPSULE | Freq: Four times a day (QID) | ORAL | Status: DC | PRN
  Administered 2019-09-21: 25 mg via ORAL
  Filled 2019-09-20: qty 1

## 2019-09-20 MED ORDER — SODIUM CHLORIDE 0.9% FLUSH
3.0000 mL | Freq: Two times a day (BID) | INTRAVENOUS | Status: DC
  Administered 2019-09-20 – 2019-09-22 (×5): 3 mL via INTRAVENOUS

## 2019-09-20 MED ORDER — SODIUM CHLORIDE 0.9 % IV SOLN: Freq: Once | INTRAVENOUS | Status: DC

## 2019-09-20 MED ORDER — ACETAMINOPHEN 325 MG PO TABS: 650.0000 mg | ORAL_TABLET | Freq: Four times a day (QID) | ORAL | Status: DC | PRN

## 2019-09-20 MED ORDER — HYDRALAZINE HCL 25 MG PO TABS
25.0000 mg | ORAL_TABLET | Freq: Four times a day (QID) | ORAL | Status: DC | PRN
  Filled 2019-09-20: qty 1

## 2019-09-20 MED ORDER — ALPRAZOLAM 0.25 MG PO TABS
0.2500 mg | ORAL_TABLET | Freq: Two times a day (BID) | ORAL | Status: DC | PRN
  Administered 2019-09-20 (×2): 0.25 mg via ORAL
  Filled 2019-09-20 (×3): qty 1

## 2019-09-20 MED ORDER — SODIUM CHLORIDE 0.9% IV SOLUTION: Freq: Once | INTRAVENOUS | Status: AC

## 2019-09-20 MED ORDER — ACETAMINOPHEN 650 MG RE SUPP: 650.0000 mg | Freq: Four times a day (QID) | RECTAL | Status: DC | PRN

## 2019-09-20 MED ORDER — HYDROCODONE-ACETAMINOPHEN 5-325 MG PO TABS: 1.0000 | ORAL_TABLET | ORAL | Status: DC | PRN

## 2019-09-20 MED ORDER — SODIUM CHLORIDE 0.9 % IV SOLN: 250.0000 mL | INTRAVENOUS | Status: DC | PRN
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
## 2019-09-20 NOTE — Progress Notes (Signed)
PROGRESS NOTE    Jordan [name]  [id number] DOB: July 20, 1966 DOA: 09/19/2019 PCP: Jearld Fenton, NP       Assessment & Plan:   Active Problems:   ESRD on dialysis [facility])   Essential hypertension   Anemia in ESRD (end-stage renal disease) (HCC)   Prolonged QT interval   Symptomatic anemia   Pruritus   Acute blood loss anemia & anemia of chronic disease: was previously in the process of receiving his first unit of packed red blood cells sometime through the transfusion developed worsening pruritus so the transfusion was stopped & the transfusion was not completed. Will give 2 units of PRBCs w/ IV benadryl prior to start of the transfusions. H&H 4 hours post transfusions   ESRD: on HD MWF. Nephro consulted   Elevated alkaline phosphatase level: previously elevated in Oct 2020. Etiology unclear. Will continue to monitor   Hyperbilirubinemia: etiology unclear, possible alcohol use. Will continue to monitor   Essential HTN: hydralazine prn  Prolonged QT interval: will monitor on tele  Pruritus: chronic. Etiology unclear. Benadryl prn  Hx of alcohol abuse: monitor for any signs/symptoms of w/drawal  Smoker: smoking cessation counseling. Nicotine patch to prevent w/drawal  DVT prophylaxis: SCDs only secondary to anemia Code Status: full  Family Communication:  Disposition Plan:    Consultants:   nephro   Antimicrobials: n/a   Subjective: Pt c/o fatigue and feeling sleepy.   Objective: Vitals:   09/20/19 0120 09/20/19 0459 09/20/19 0459 09/20/19 0633  BP: (!) 148/94 (!) 152/99 (!) 152/99 (!) 152/92  Pulse: (!) 101 (!) 108 (!) 105 (!) 101  Resp: 18 18 20 16   Temp:    98.3 F (36.8 C)  TempSrc:    Oral  SpO2: 90% 96% 92% 100%  Weight:      Height:        Intake/Output Summary (Last 24 hours) at 09/20/2019 0736 Last data filed at 09/20/2019 0220 Gross per 24 hour  Intake 175 ml  Output --  Net 175 ml   Filed Weights   09/19/19 1442  Weight:  70.3 kg    Examination:  General exam: Appears calm and comfortable  Respiratory system: diminished breath sounds b/l. No rales Cardiovascular system: S1 & S2 +. No , rubs, gallops or clicks.  Gastrointestinal system: Abdomen is nondistended, soft and nontender. Normal bowel sounds heard. Central nervous system: Alert and oriented. Moves all 4 extremities Psychiatry: Judgement and insight appear normal. Flat mood and affect    Data Reviewed: I have personally reviewed following labs and imaging studies  CBC: Recent Labs  Lab 09/19/19 1447 09/20/19 0145 09/20/19 0454  WBC 8.2 7.1 7.5  NEUTROABS 5.6 4.8  --   HGB 6.7* 6.4* 6.5*  HCT 21.1* 20.0* 20.8*  MCV 107.7* 106.4* 107.2*  PLT 170 157 0000000   Basic Metabolic Panel: Recent Labs  Lab 09/19/19 1447 09/19/19 1602 09/20/19 0454  NA 140  --  142  K 3.7  --  4.2  CL 96*  --  98  CO2 30  --  28  GLUCOSE 82  --  98  BUN 37*  --  46*  CREATININE 9.67*  --  11.25*  CALCIUM 8.3*  --  8.1*  MG  --  2.1 2.1  PHOS  --  3.8 5.0*   GFR: Estimated Creatinine Clearance: 6.4 mL/min (A) (by C-G formula based on SCr of 11.25 mg/dL (H)). Liver Function Tests: Recent Labs  Lab 09/19/19 1447 09/20/19 0454  AST 16 15  ALT 10 9  ALKPHOS 122 128*  BILITOT 1.5* 1.3*  PROT 7.5 7.1  ALBUMIN 3.6 3.4*   No results for input(s): LIPASE, AMYLASE in the last 168 hours. No results for input(s): AMMONIA in the last 168 hours. Coagulation Profile: No results for input(s): INR, PROTIME in the last 168 hours. Cardiac Enzymes: No results for input(s): CKTOTAL, CKMB, CKMBINDEX, TROPONINI in the last 168 hours. BNP (last 3 results) No results for input(s): PROBNP in the last 8760 hours. HbA1C: No results for input(s): HGBA1C in the last 72 hours. CBG: No results for input(s): GLUCAP in the last 168 hours. Lipid Profile: No results for input(s): CHOL, HDL, LDLCALC, TRIG, CHOLHDL, LDLDIRECT in the last 72 hours. Thyroid Function  Tests: Recent Labs    09/20/19 0454  TSH 0.665   Anemia Panel: Recent Labs    09/19/19 1447 09/19/19 1952  VITAMINB12  --  262  FOLATE 8.9  --   FERRITIN 727*  --   TIBC 340  --   IRON 110  --   RETICCTPCT  --  4.8*   Sepsis Labs: No results for input(s): PROCALCITON, LATICACIDVEN in the last 168 hours.  Recent Results (from the past 240 hour(s))  SARS CORONAVIRUS 2 (TAT 6-24 HRS) Nasopharyngeal Nasopharyngeal Swab     Status: None   Collection Time: 09/19/19  6:30 PM   Specimen: Nasopharyngeal Swab  Result Value Ref Range Status   SARS Coronavirus 2 NEGATIVE NEGATIVE Final    Comment: (NOTE) SARS-CoV-2 target nucleic acids are NOT DETECTED. The SARS-CoV-2 RNA is generally detectable in upper and lower respiratory specimens during the acute phase of infection. Negative results do not preclude SARS-CoV-2 infection, do not rule out co-infections with other pathogens, and should not be used as the sole basis for treatment or other patient management decisions. Negative results must be combined with clinical observations, patient history, and epidemiological information. The expected result is Negative. Fact Sheet for Patients: SugarRoll.be Fact Sheet for Healthcare Providers: https://www.woods-mathews.com/ This test is not yet approved or cleared by the [place] FDA and  has been authorized for detection and/or diagnosis of SARS-CoV-2 by FDA under an Emergency Use Authorization (EUA). This EUA will remain  in effect (meaning this test can be used) for the duration of the COVID-19 declaration under Section 56 4(b)(1) of the Act, 21 U.S.C. section 360bbb-3(b)(1), unless the authorization is terminated or revoked sooner. Performed at [facility], [place] [address], [place], [place] [address]          Radiology Studies: No results found.      Scheduled Meds: . sodium chloride   Intravenous Once  .  diphenhydrAMINE  25 mg Intravenous Once  . mouth rinse  15 mL Mouth Rinse BID  . nicotine  21 mg Transdermal Once  . sodium chloride flush  3 mL Intravenous Q12H  . triamcinolone ointment   Topical BID   Continuous Infusions: . sodium chloride Stopped (09/20/19 0511)  . sodium chloride       LOS: 0 days    Time spent: 33 mins    Wyvonnia Dusky, MD [facility] Pager [phone]  If 7PM-7AM, please contact night-coverage [email or web address] Password [facility] 09/20/2019, 7:36 AM

## 2019-09-20 NOTE — ED Notes (Addendum)
ED TO INPATIENT HANDOFF REPORT  ED Nurse Name and Phone #: Karena Addison [phone]  S Name/Age/Gender Jordan [name] 53 y.o. male Room/Bed: ED38A/ED38A  Code Status   Code Status: DNR  Home/SNF/Other Home Patient oriented to: self, place, time and situation Is this baseline? Yes   Triage Complete: Triage complete  Chief Complaint Symptomatic anemia [D64.9]  Triage Note PT reports feeling itchy all over x 2 days. Pt unsure if he was having an allergic reaction but reports the PO benadryl at home has not been working. NO hives noted upon assessment in triage but pt reports itching on abd and both legs.     Allergies No Known Allergies  Level of Care/Admitting Diagnosis ED Disposition    ED Disposition Condition Comment   Admit  Hospital Area: [facility] [[id number]]  Level of Care: Telemetry [5]  Covid Evaluation: Asymptomatic Screening Protocol (No Symptoms)  Diagnosis: Symptomatic anemia [id number]  Admitting Physician: Toy Baker [[id number]]  Attending Physician: Toy Baker [[id number]]  PT Class (Do Not Modify): Observation [104]  PT Acc Code (Do Not Modify): Observation [10022]       B Medical/Surgery History Past Medical History:  Diagnosis Date  . Anemia   . CKD (chronic kidney disease)    Stage 5  Dialysis - M/W/F in [place], [place]  . Dyspnea    tx with inhaler when sick  . ED (erectile dysfunction)   . ETOH abuse   . History of blood transfusion   . Hypertension   . Wears dentures    Past Surgical History:  Procedure Laterality Date  . BASCILIC VEIN TRANSPOSITION Left 08/07/2016   Procedure: LEFT BASILIC VEIN TRANSPOSITION;  Surgeon: Angelia Mould, MD;  Location: [facility];  Service: Vascular;  Laterality: Left;  . CLOSED REDUCTION NASAL FRACTURE N/A 08/11/2019   Procedure: CLOSED REDUCTION NASAL FRACTURE WITH STABILIZATION;  Surgeon: Irene Limbo, MD;  Location: [facility];  Service: Plastics;  Laterality: N/A;  .  COLONOSCOPY N/A 08/12/2016   Procedure: COLONOSCOPY;  Surgeon: Otis Brace, MD;  Location: [facility];  Service: Gastroenterology;  Laterality: N/A;  . ESOPHAGOGASTRODUODENOSCOPY N/A 08/12/2016   Procedure: ESOPHAGOGASTRODUODENOSCOPY (EGD);  Surgeon: Otis Brace, MD;  Location: [facility];  Service: Gastroenterology;  Laterality: N/A;  . INSERTION OF DIALYSIS CATHETER N/A 08/07/2016   Procedure: INSERTION OF TUNNELED DIALYSIS CATHETER;  Surgeon: Angelia Mould, MD;  Location: [facility];  Service: Vascular;  Laterality: N/A;  . LIGATION OF ARTERIOVENOUS  FISTULA Left 09/12/2016   Procedure: BANDING OF LEFT  ARTERIOVENOUS  FISTULA;  Surgeon: Angelia Mould, MD;  Location: [facility];  Service: Vascular;  Laterality: Left;  . LOWER EXTREMITY INTERVENTION Right 12/02/2018   Procedure: LOWER EXTREMITY INTERVENTION;  Surgeon: Algernon Huxley, MD;  Location: [facility];  Service: Cardiovascular;  Laterality: Right;     A IV Location/Drains/Wounds Patient Lines/Drains/Airways Status   Active Line/Drains/Airways    Name:   Placement date:   Placement time:   Site:   Days:   Peripheral IV 09/19/19 Right Antecubital   09/19/19    1808    Antecubital   1   Peripheral IV 09/19/19 Right Forearm   09/19/19    1809    Forearm   1   Fistula / Graft Left Upper arm Arteriovenous fistula   --    --    Upper arm      Incision (Closed) 08/11/19 Nose   08/11/19    0757     40  Intake/Output Last 24 hours  Intake/Output Summary (Last 24 hours) at 09/20/2019 0602 Last data filed at 09/20/2019 0220 Gross per 24 hour  Intake 175 ml  Output --  Net 175 ml    Labs/Imaging Results for orders placed or performed during the hospital encounter of 09/19/19 (from the past 48 hour(s))  CBC with Differential     Status: Abnormal   Collection Time: 09/19/19  2:47 PM  Result Value Ref Range   WBC 8.2 4.0 - 10.5 K/uL   RBC 1.96 (L) 4.22 - 5.81 MIL/uL   Hemoglobin 6.7 (L) 13.0 -  17.0 g/dL   HCT 21.1 (L) 39.0 - 52.0 %   MCV 107.7 (H) 80.0 - 100.0 fL   MCH 34.2 (H) 26.0 - 34.0 pg   MCHC 31.8 30.0 - 36.0 g/dL   RDW 13.7 11.5 - 15.5 %   Platelets 170 150 - 400 K/uL   nRBC 0.0 0.0 - 0.2 %   Neutrophils Relative % 68 %   Neutro Abs 5.6 1.7 - 7.7 K/uL   Lymphocytes Relative 16 %   Lymphs Abs 1.3 0.7 - 4.0 K/uL   Monocytes Relative 7 %   Monocytes Absolute 0.6 0.1 - 1.0 K/uL   Eosinophils Relative 7 %   Eosinophils Absolute 0.6 (H) 0.0 - 0.5 K/uL   Basophils Relative 1 %   Basophils Absolute 0.1 0.0 - 0.1 K/uL   Immature Granulocytes 1 %   Abs Immature Granulocytes 0.07 0.00 - 0.07 K/uL    Comment: Performed at [facility], [address] [place], [place] [address]  Comprehensive metabolic panel     Status: Abnormal   Collection Time: 09/19/19  2:47 PM  Result Value Ref Range   Sodium 140 135 - 145 mmol/L   Potassium 3.7 3.5 - 5.1 mmol/L   Chloride 96 (L) 98 - 111 mmol/L   CO2 30 22 - 32 mmol/L   Glucose, Bld 82 70 - 99 mg/dL   BUN 37 (H) 6 - 20 mg/dL   Creatinine, Ser 9.67 (H) 0.61 - 1.24 mg/dL   Calcium 8.3 (L) 8.9 - 10.3 mg/dL   Total Protein 7.5 6.5 - 8.1 g/dL   Albumin 3.6 3.5 - 5.0 g/dL   AST 16 15 - 41 U/L   ALT 10 0 - 44 U/L   Alkaline Phosphatase 122 38 - 126 U/L   Total Bilirubin 1.5 (H) 0.3 - 1.2 mg/dL   GFR calc non Af Amer 6 (L) >60 mL/min   GFR calc Af Amer 6 (L) >60 mL/min   Anion gap 14 5 - 15    Comment: Performed at [facility], [address], [place], [place] [address]  Folate     Status: None   Collection Time: 09/19/19  2:47 PM  Result Value Ref Range   Folate 8.9 >5.9 ng/mL    Comment: Performed at [facility], [address] [place], [place] [address]  Iron and TIBC     Status: None   Collection Time: 09/19/19  2:47 PM  Result Value Ref Range   Iron 110 45 - 182 ug/dL   TIBC 340 250 - 450 ug/dL   Saturation Ratios 32 17.9 - 39.5 %   UIBC 230 ug/dL    Comment: Performed at [facility], [address], [place], [place] [address]  Ferritin     Status: Abnormal   Collection Time: 09/19/19  2:47 PM  Result Value Ref Range   Ferritin  727 (H) 24 - 336 ng/mL    Comment: Performed at [facility], [address] [place], [place] [address]  Magnesium     Status: None   Collection Time: 09/19/19  4:02 PM  Result Value Ref Range   Magnesium 2.1 1.7 - 2.4 mg/dL    Comment: Performed at [facility], [address] [place], [place] [address]  Phosphorus     Status: None   Collection Time: 09/19/19  4:02 PM  Result Value Ref Range   Phosphorus 3.8 2.5 - 4.6 mg/dL    Comment: Performed at [facility], [address] [place], [place] [address]  Prepare RBC     Status: None   Collection Time: 09/19/19  5:18 PM  Result Value Ref Range   Order Confirmation      ORDER PROCESSED BY BLOOD BANK Performed at [facility], [address] [place], [place] [address]   Type and screen [place]     Status: None (Preliminary result)   Collection Time: 09/19/19  6:04 PM  Result Value Ref Range   ABO/RH(D) O NEG    Antibody Screen POS    Sample Expiration 09/22/2019,2359    DAT, IgG NEG    DAT, complement NEG    Antibody Identification ANTI D ANTI C    Unit Number [id number]    Blood Component Type RED CELLS,LR    Unit division 00    Status of Unit ALLOCATED    Transfusion Status OK TO TRANSFUSE    Crossmatch Result COMPATIBLE    Unit Number [id number]    Blood Component Type RED CELLS,LR    Unit division 00    Status of Unit ISSUED    Transfusion Status OK TO TRANSFUSE    Crossmatch Result      COMPATIBLE Performed at [facility], [address] [place], [place] [address]    Unit Number [id number]    Blood Component Type RED CELLS,LR    Unit division 00    Status of Unit ALLOCATED    Transfusion Status OK TO TRANSFUSE    Crossmatch Result COMPATIBLE    Unit Number  [id number]    Blood Component Type RED CELLS,LR    Unit division 00    Status of Unit ALLOCATED    Transfusion Status OK TO TRANSFUSE    Crossmatch Result COMPATIBLE   SARS CORONAVIRUS 2 (TAT 6-24 HRS) Nasopharyngeal Nasopharyngeal Swab     Status: None   Collection Time: 09/19/19  6:30 PM   Specimen: Nasopharyngeal Swab  Result Value Ref Range   SARS Coronavirus 2 NEGATIVE NEGATIVE    Comment: (NOTE) SARS-CoV-2 target nucleic acids are NOT DETECTED. The SARS-CoV-2 RNA is generally detectable in upper and lower respiratory specimens during the acute phase of infection. Negative results do not preclude SARS-CoV-2 infection, do not rule out co-infections with other pathogens, and should not be used as the sole basis for treatment or other patient management decisions. Negative results must be combined with clinical observations, patient history, and epidemiological information. The expected result is Negative. Fact Sheet for Patients: SugarRoll.be Fact Sheet for Healthcare Providers: https://www.woods-mathews.com/ This test is not yet approved or cleared by the [place] FDA and  has been authorized for detection and/or diagnosis of SARS-CoV-2 by FDA under an Emergency Use Authorization (EUA). This EUA will remain  in effect (meaning this test can be used) for the duration of the COVID-19 declaration under Section 56 4(b)(1) of the  Act, 21 U.S.C. section 360bbb-3(b)(1), unless the authorization is terminated or revoked sooner. Performed at [facility], [place] [address] [place], [place] [address]   Vitamin B12     Status: None   Collection Time: 09/19/19  7:52 PM  Result Value Ref Range   Vitamin B-12 262 180 - 914 pg/mL    Comment: (NOTE) This assay is not validated for testing neonatal or myeloproliferative syndrome specimens for Vitamin B12 levels. Performed at [facility], [place] [address] [place],  [place] [address]   Reticulocytes     Status: Abnormal   Collection Time: 09/19/19  7:52 PM  Result Value Ref Range   Retic Ct Pct 4.8 (H) 0.4 - 3.1 %   RBC. 1.89 (L) 4.22 - 5.81 MIL/uL   Retic Count, Absolute 90.9 19.0 - 186.0 K/uL   Immature Retic Fract 20.2 (H) 2.3 - 15.9 %    Comment: Performed at [facility], [address] [place], [place] [address]  CBC with Differential     Status: Abnormal   Collection Time: 09/20/19  1:45 AM  Result Value Ref Range   WBC 7.1 4.0 - 10.5 K/uL   RBC 1.88 (L) 4.22 - 5.81 MIL/uL   Hemoglobin 6.4 (L) 13.0 - 17.0 g/dL   HCT 20.0 (L) 39.0 - 52.0 %   MCV 106.4 (H) 80.0 - 100.0 fL   MCH 34.0 26.0 - 34.0 pg   MCHC 32.0 30.0 - 36.0 g/dL   RDW 14.2 11.5 - 15.5 %   Platelets 157 150 - 400 K/uL   nRBC 0.0 0.0 - 0.2 %   Neutrophils Relative % 67 %   Neutro Abs 4.8 1.7 - 7.7 K/uL   Lymphocytes Relative 16 %   Lymphs Abs 1.1 0.7 - 4.0 K/uL   Monocytes Relative 7 %   Monocytes Absolute 0.5 0.1 - 1.0 K/uL   Eosinophils Relative 9 %   Eosinophils Absolute 0.6 (H) 0.0 - 0.5 K/uL   Basophils Relative 0 %   Basophils Absolute 0.0 0.0 - 0.1 K/uL   Immature Granulocytes 1 %   Abs Immature Granulocytes 0.07 0.00 - 0.07 K/uL    Comment: Performed at [facility], [address] [place], [place] [address]  CBC     Status: Abnormal   Collection Time: 09/20/19  4:54 AM  Result Value Ref Range   WBC 7.5 4.0 - 10.5 K/uL   RBC 1.94 (L) 4.22 - 5.81 MIL/uL   Hemoglobin 6.5 (L) 13.0 - 17.0 g/dL   HCT 20.8 (L) 39.0 - 52.0 %   MCV 107.2 (H) 80.0 - 100.0 fL   MCH 33.5 26.0 - 34.0 pg   MCHC 31.3 30.0 - 36.0 g/dL   RDW 14.3 11.5 - 15.5 %   Platelets 160 150 - 400 K/uL   nRBC 0.0 0.0 - 0.2 %    Comment: Performed at [facility], [address] [place],  [address]   No results found.  Pending Labs Unresulted Labs (From admission, onward)    Start     Ordered   09/20/19 0500  HIV Antibody (routine testing w rflx)  (HIV Antibody  (Routine testing w reflex) panel)  Tomorrow morning,   STAT     09/20/19 0144   09/20/19 0500  Magnesium  Tomorrow morning,   STAT    Comments: Call MD if <1.5    09/20/19 0144   09/20/19 0500  Phosphorus  Tomorrow morning,   STAT  09/20/19 0144   09/20/19 0500  TSH  Once,   STAT    Comments: Cancel if already done within 1 month and notify MD    09/20/19 0144   09/20/19 0500  Comprehensive metabolic panel  Once,   STAT    Comments: Cal MD for K<3.5 or >5.0    09/20/19 0144   Unscheduled  Occult blood card to lab, stool RN will collect  As needed,   STAT    Question Answer Comment  Specimen to be collected by: RN will collect   Release to patient Immediate      09/19/19 1936          Vitals/Pain Today's Vitals   09/20/19 0120 09/20/19 0459 09/20/19 0459 09/20/19 0509  BP: (!) 148/94 (!) 152/99 (!) 152/99   Pulse: (!) 101 (!) 108 (!) 105   Resp: 18 18 20    Temp:      TempSrc:      SpO2: 90% 96% 92%   Weight:      Height:      PainSc:   0-No pain 0-No pain    Isolation Precautions No active isolations  Medications Medications  triamcinolone ointment (KENALOG) 0.5 % ( Topical Given 09/19/19 2122)  acetaminophen (TYLENOL) tablet 650 mg (has no administration in time range)    Or  acetaminophen (TYLENOL) suppository 650 mg (has no administration in time range)  HYDROcodone-acetaminophen (NORCO/VICODIN) 5-325 MG per tablet 1-2 tablet (has no administration in time range)  0.9 %  sodium chloride infusion ( Intravenous Hold 09/20/19 0511)  diphenhydrAMINE (BENADRYL) capsule 25 mg (has no administration in time range)  sodium chloride flush (NS) 0.9 % injection 3 mL (has no administration in time range)  sodium chloride flush (NS) 0.9 % injection 3 mL (has no administration in time range)  0.9 %  sodium chloride infusion (has no administration in time range)  nicotine (NICODERM CQ - dosed in mg/24 hours) patch 21 mg (21 mg Transdermal Patch Applied 09/20/19 0050)   0.9 %  sodium chloride infusion (10 mL/hr Intravenous New Bag/Given 09/20/19 0038)  famotidine (PEPCID) IVPB 20 mg premix (0 mg Intravenous Stopped 09/19/19 1908)  hydrOXYzine (ATARAX/VISTARIL) tablet 25 mg (25 mg Oral Given 09/19/19 1820)  diphenhydrAMINE (BENADRYL) injection 25 mg (25 mg Intravenous Given 09/20/19 0132)    Mobility walks Low fall risk   Focused Assessments    R Recommendations: See Admitting Provider Note  Report given to: Elisha Headland, RN

## 2019-09-20 NOTE — ED Notes (Signed)
Patient placed on 2L O2 due to continued shortness of breath and O2 sat of 90-92% on RA.

## 2019-09-20 NOTE — ED Notes (Signed)
Pt called out for increased itching after blood administration was started at 0032. Blood stopped and sent back to blood bank. Hospitalist made aware of situation.

## 2019-09-21 ENCOUNTER — Inpatient Hospital Stay: Payer: Medicare Other

## 2019-09-21 DIAGNOSIS — R2241 Localized swelling, mass and lump, right lower limb: Secondary | ICD-10-CM

## 2019-09-21 DIAGNOSIS — D539 Nutritional anemia, unspecified: Secondary | ICD-10-CM

## 2019-09-21 LAB — CBC
HCT: 26.2 % — ABNORMAL LOW (ref 39.0–52.0)
Hemoglobin: 8.6 g/dL — ABNORMAL LOW (ref 13.0–17.0)
MCH: 33.1 pg (ref 26.0–34.0)
MCHC: 32.8 g/dL (ref 30.0–36.0)
MCV: 100.8 fL — ABNORMAL HIGH (ref 80.0–100.0)
Platelets: 150 10*3/uL (ref 150–400)
RBC: 2.6 MIL/uL — ABNORMAL LOW (ref 4.22–5.81)
RDW: 17.9 % — ABNORMAL HIGH (ref 11.5–15.5)
WBC: 9 10*3/uL (ref 4.0–10.5)
nRBC: 0 % (ref 0.0–0.2)

## 2019-09-21 LAB — COMPREHENSIVE METABOLIC PANEL
ALT: 9 U/L (ref 0–44)
AST: 12 U/L — ABNORMAL LOW (ref 15–41)
Albumin: 3.5 g/dL (ref 3.5–5.0)
Alkaline Phosphatase: 114 U/L (ref 38–126)
Anion gap: 18 — ABNORMAL HIGH (ref 5–15)
BUN: 65 mg/dL — ABNORMAL HIGH (ref 6–20)
CO2: 25 mmol/L (ref 22–32)
Calcium: 8.2 mg/dL — ABNORMAL LOW (ref 8.9–10.3)
Chloride: 99 mmol/L (ref 98–111)
Creatinine, Ser: 13.64 mg/dL — ABNORMAL HIGH (ref 0.61–1.24)
GFR calc Af Amer: 4 mL/min — ABNORMAL LOW (ref >60)
GFR calc non Af Amer: 4 mL/min — ABNORMAL LOW (ref >60)
Glucose, Bld: 101 mg/dL — ABNORMAL HIGH (ref 70–99)
Potassium: 4.5 mmol/L (ref 3.5–5.1)
Sodium: 142 mmol/L (ref 135–145)
Total Bilirubin: 1.6 mg/dL — ABNORMAL HIGH (ref 0.3–1.2)
Total Protein: 7.2 g/dL (ref 6.5–8.1)

## 2019-09-21 IMAGING — MR MR FEMUR*R* W/O CM
5 series · 40 of 40 positions shown · non-contrast
Comparison: None.

CLINICAL DATA: Soft tissue mass in the right leg.

EXAM:
MRI OF THE RIGHT FEMUR WITHOUT CONTRAST
TECHNIQUE: Multiplanar, multisequence MR imaging of the right femur was
performed. No intravenous contrast was administered.

[Series 6: composed cor t1_comp · coronal · right · 5.6mm · 1.08mm/px · 4 of 34 slices shown]
[im 1/34]
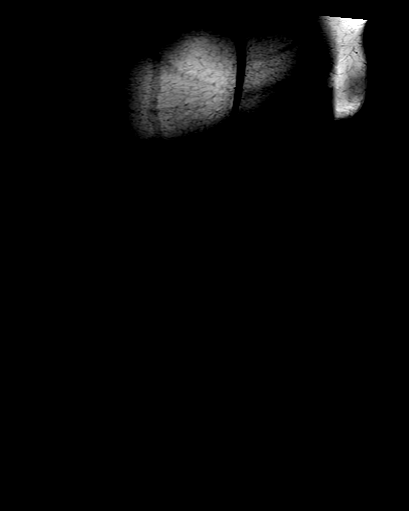
[im 12/34]
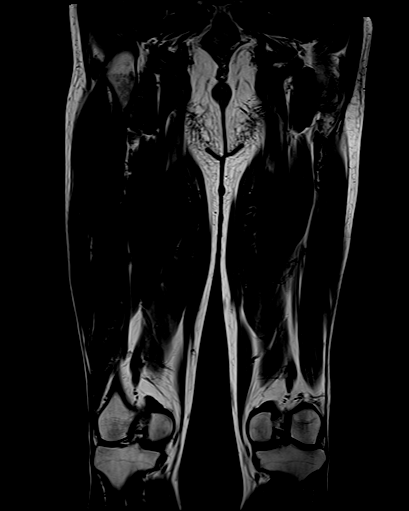
[im 23/34]
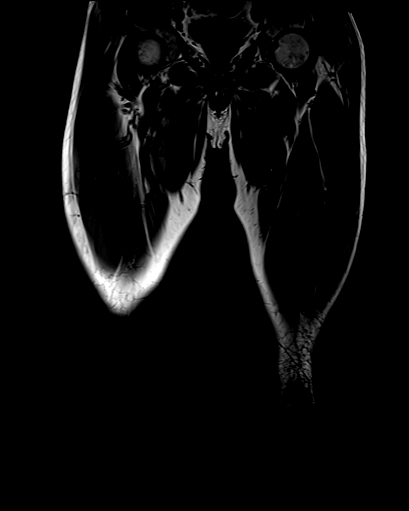
[im 34/34]
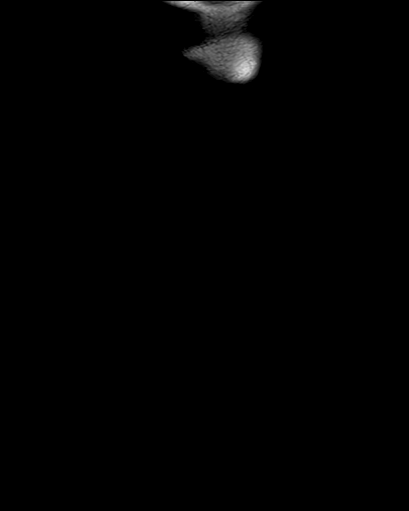

[Series 10: composed cor stir_comp · coronal · right · 5.6mm · 1.08mm/px · 5 of 34 slices shown]
[im 1/34]
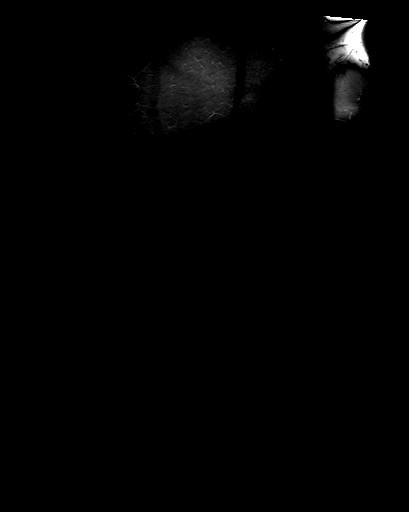
[im 9/34]
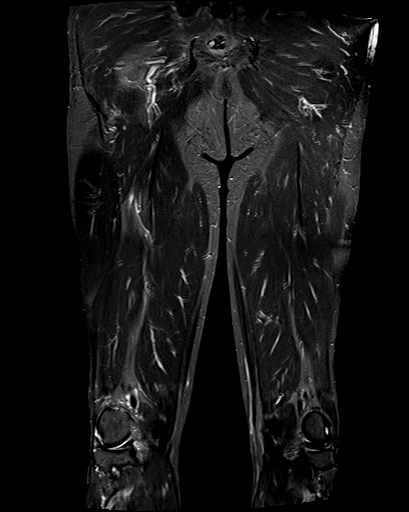
[im 17/34]
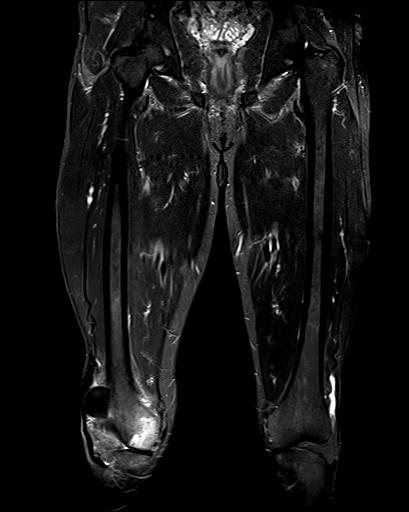
[im 25/34]
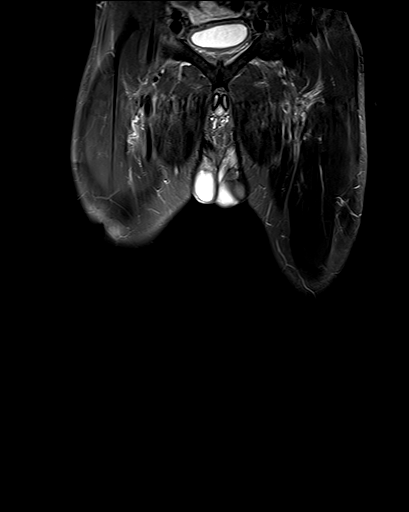
[im 34/34]
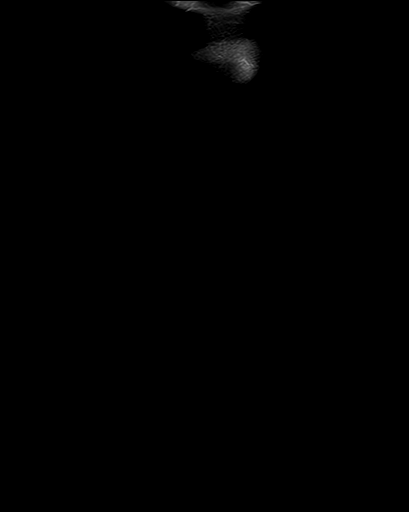

[Series 15: T2 · axial · right · 5.0mm · 0.86mm/px · z∈[-224,+292]mm · 13 of 87 slices shown]
[im 1/87]
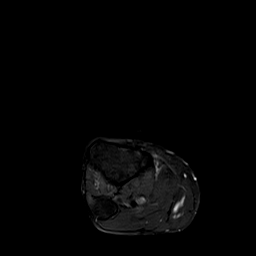
[im 8/87]
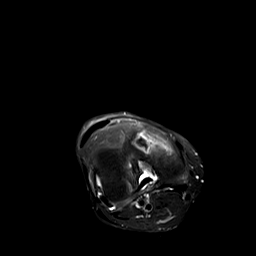
[im 15/87]
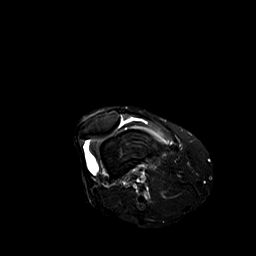
[im 22/87]
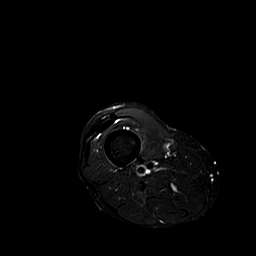
[im 29/87]
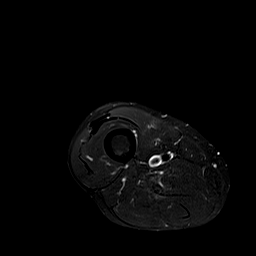
[im 36/87]
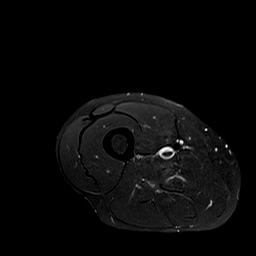
[im 44/87]
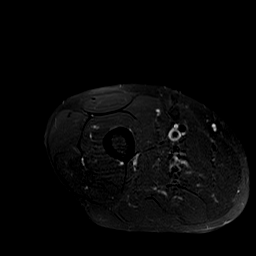
[im 51/87]
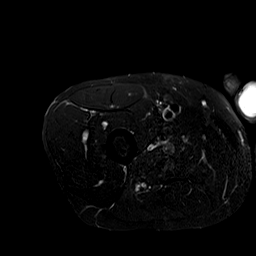
[im 58/87]
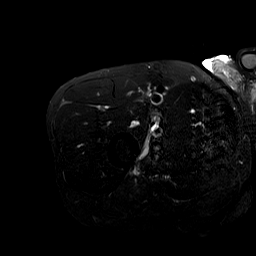
[im 65/87]
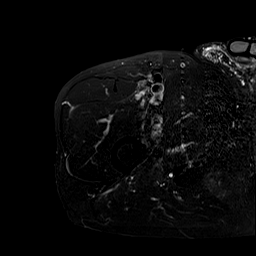
[im 72/87]
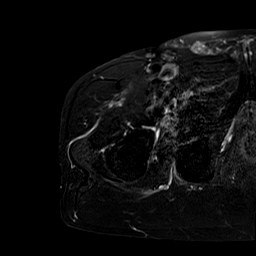
[im 79/87]
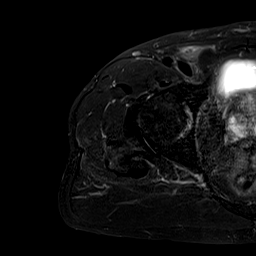
[im 87/87]
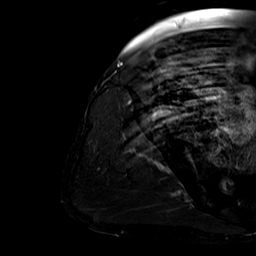

[Series 18: ax t1_comp · axial · right · 5.0mm · 0.86mm/px · z∈[-224,+292]mm · 13 of 87 slices shown]
[im 1/87]
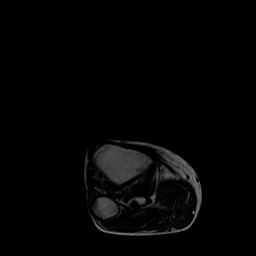
[im 8/87]
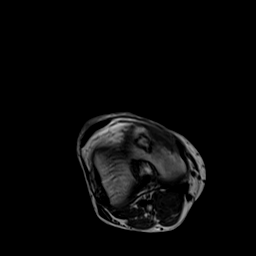
[im 15/87]
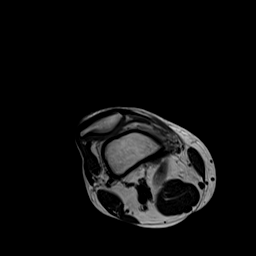
[im 22/87]
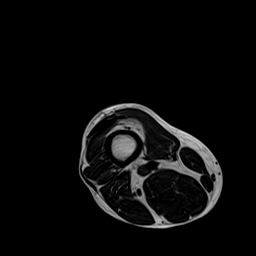
[im 29/87]
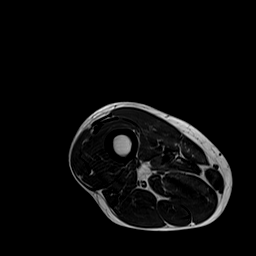
[im 36/87]
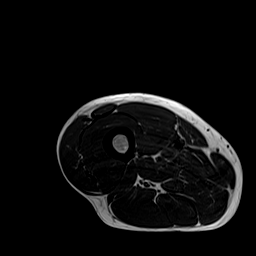
[im 44/87]
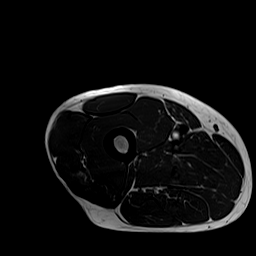
[im 51/87]
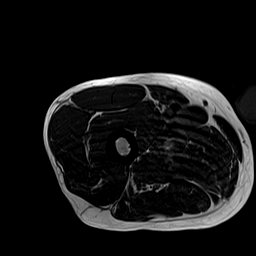
[im 58/87]
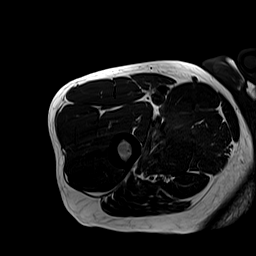
[im 65/87]
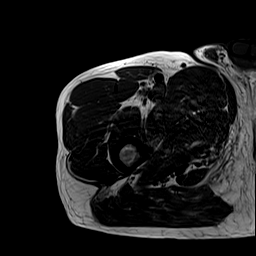
[im 72/87]
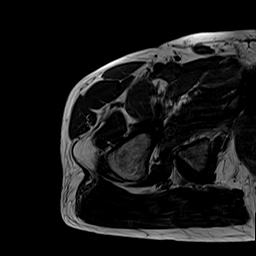
[im 79/87]
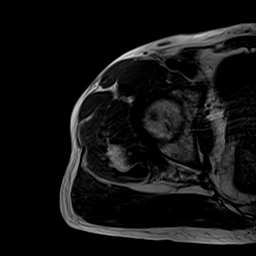
[im 87/87]
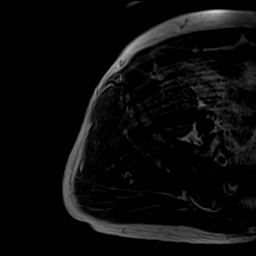

[Series 23: t2_tse_sag fs_comp · sagittal · right · 5.6mm · 0.94mm/px · 5 of 36 slices shown]
[im 1/36]
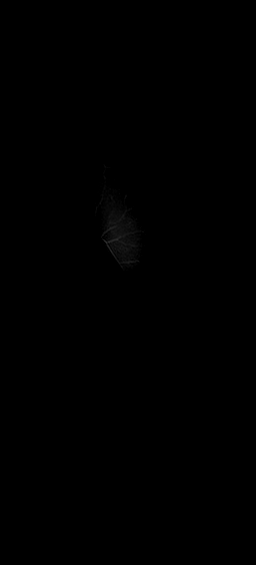
[im 9/36]
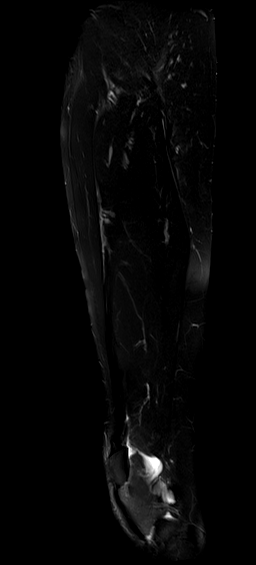
[im 18/36]
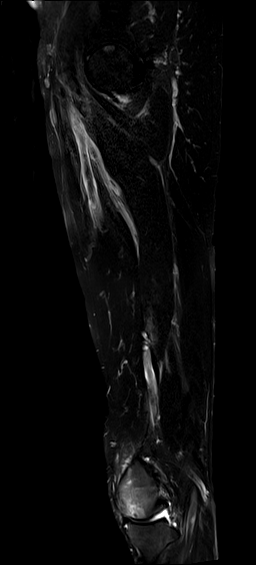
[im 27/36]
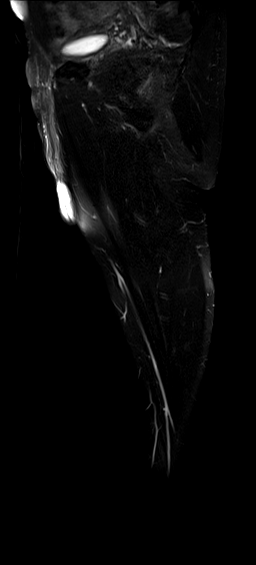
[im 36/36]
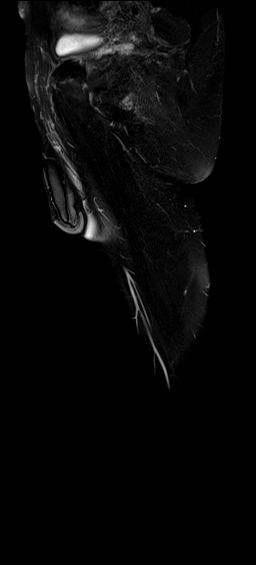

[40 of 40 positions shown; findings below may reference images not displayed]

FINDINGS: Bones/Joint/Cartilage

No fracture or dislocation. Normal alignment. Small joint effusion.
Minimal osteoarthritis of the right hip with mild subchondral
reactive marrow changes.

Subchondral serpiginous signal abnormality in the right medial
femoral condyle with severe surrounding marrow edema which may
reflect subchondral osteonecrosis versus a bone infarct.

No aggressive osseous lesion.

Ligaments

Collateral ligaments are intact.

Muscles and Tendons
Muscles are normal. No muscle atrophy. No intramuscular fluid
collection or hematoma.

Soft tissue
No fluid collection or hematoma.  No soft tissue mass.
IMPRESSION: 1. No soft tissue mass at the site of clinical palpable abnormality
in the anterior aspect of the right thigh.
2. Subchondral serpiginous signal abnormality in the right medial
femoral condyle with severe surrounding marrow edema which may
reflect subchondral osteonecrosis versus a bone infarct.

## 2019-09-21 MED ORDER — LABETALOL HCL 5 MG/ML IV SOLN
10.0000 mg | Freq: Four times a day (QID) | INTRAVENOUS | Status: DC | PRN
  Filled 2019-09-21: qty 4

## 2019-09-21 MED ORDER — CALCIUM ACETATE (PHOS BINDER) 667 MG PO CAPS
1334.0000 mg | ORAL_CAPSULE | Freq: Three times a day (TID) | ORAL | Status: DC
  Administered 2019-09-22 (×2): 1334 mg via ORAL
  Filled 2019-09-21 (×2): qty 2

## 2019-09-21 MED ORDER — EPOETIN ALFA 10000 UNIT/ML IJ SOLN
4000.0000 [IU] | INTRAMUSCULAR | Status: DC
  Administered 2019-09-21: 4000 [IU] via INTRAVENOUS

## 2019-09-21 MED ORDER — CHLORHEXIDINE GLUCONATE CLOTH 2 % EX PADS: 6.0000 | MEDICATED_PAD | Freq: Every day | CUTANEOUS | Status: DC

## 2019-09-21 NOTE — Progress Notes (Signed)
   09/21/19 0930  Neurological  Level of Consciousness Alert  Orientation Level Oriented X4  Respiratory  Respiratory Pattern Regular;Unlabored  Bilateral Breath Sounds Clear  Cardiac  Pulse Regular  Heart Sounds S1, S2  ECG Monitor Yes  pt stable for HD TX AVF +/+ UFG 1.5L no c/os
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
## 2019-09-21 NOTE — Progress Notes (Addendum)
PROGRESS NOTE    Jordan [name]  [id number] DOB: May 26, 1966 DOA: 09/19/2019 PCP: Jearld Fenton, NP       Assessment & Plan:   Active Problems:   ESRD on dialysis [facility])   Essential hypertension   Anemia in ESRD (end-stage renal disease) (HCC)   Prolonged QT interval   Symptomatic anemia   Pruritus   Hyperbilirubinemia   Acute blood loss anemia & anemia of chronic disease: etiology unclear. Improved today. S/p 2 units of PRBCs 09/20/19. Will continue to monitor H&H. Fecal occult ordered. GI consulted. 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 Here, there is focal penetrating atherosclerotic ulcer versus focal dissection. Likely chronic. Discussed the case w/ Dr. Lucky Cowboy (vascular surg) and he stated that those findings are not worrisome & would not cause any bleeding, pain or other issues.  ESRD: on HD MWF. Nephro following and recs apprec  Right leg mass: intermittent as per pt. Etiology unclear, possible hematoma. MRI femur ordered as pt was previously scheduled as an outpatient for this test but has missed the appt 5 times secondary to transportation issues   Elevated alkaline phosphatase level: previously elevated in Oct 2020. Resolved  Hyperbilirubinemia: etiology unclear, possible alcohol use. Trending up today. Will continue to monitor   Essential HTN: hydralazine prn  Prolonged QT interval: will monitor on tele  Pruritus: chronic. Etiology unclear. Benadryl prn  Hx of alcohol abuse: monitor for any signs/symptoms of w/drawal  Smoker: smoking cessation counseling. Nicotine patch to prevent w/drawal  DVT prophylaxis: SCDs only secondary to anemia Code Status: full  Family Communication:  Disposition Plan:    Consultants:   nephro  GI   Antimicrobials: n/a   Subjective: Pt c/o malaise  Objective: Vitals:   09/20/19 2115 09/21/19 0050 09/21/19 0146 09/21/19 0310  BP: (!) 164/97 (!) 162/110 (!)  172/102 (!) 152/88  Pulse: 99 96 (!) 102 95  Resp: 17 19  20   Temp: 97.9 F (36.6 C) 97.8 F (36.6 C)  98.7 F (37.1 C)  TempSrc: Oral Oral  Oral  SpO2: 93% 98%  93%  Weight:      Height:        Intake/Output Summary (Last 24 hours) at 09/21/2019 0757 Last data filed at 09/20/2019 1700 Gross per 24 hour  Intake 847 ml  Output --  Net 847 ml   Filed Weights   09/19/19 1442  Weight: 70.3 kg    Examination:  General exam: Appears calm and comfortable  Respiratory system: decreased breath sounds b/l. No wheezes Cardiovascular system: S1 & S2 +. No rubs, gallops or clicks.  Gastrointestinal system: Abdomen is nondistended, soft and nontender. Normal bowel sounds heard. Central nervous system: Alert and oriented. Moves all 4 extremities Psychiatry: Judgement and insight appear normal. Flat mood and affect    Data Reviewed: I have personally reviewed following labs and imaging studies  CBC: Recent Labs  Lab 09/19/19 1447 09/20/19 0145 09/20/19 0454 09/20/19 1811 09/21/19 0414  WBC 8.2 7.1 7.5  --  9.0  NEUTROABS 5.6 4.8  --   --   --   HGB 6.7* 6.4* 6.5* 9.1* 8.6*  HCT 21.1* 20.0* 20.8* 28.0* 26.2*  MCV 107.7* 106.4* 107.2*  --  100.8*  PLT 170 157 160  --  [id number]   Basic Metabolic Panel: Recent Labs  Lab 09/19/19 1447 09/19/19 1602 09/20/19 0454 09/21/19 0414  NA 140  --  142 142  K 3.7  --  4.2  4.5  CL 96*  --  98 99  CO2 30  --  28 25  GLUCOSE 82  --  98 101*  BUN 37*  --  46* 65*  CREATININE 9.67*  --  11.25* 13.64*  CALCIUM 8.3*  --  8.1* 8.2*  MG  --  2.1 2.1  --   PHOS  --  3.8 5.0*  --    GFR: Estimated Creatinine Clearance: 5.3 mL/min (A) (by C-G formula based on SCr of 13.64 mg/dL (H)). Liver Function Tests: Recent Labs  Lab 09/19/19 1447 09/20/19 0454 09/21/19 0414  AST 16 15 12*  ALT 10 9 9   ALKPHOS 122 128* 114  BILITOT 1.5* 1.3* 1.6*  PROT 7.5 7.1 7.2  ALBUMIN 3.6 3.4* 3.5   No results for input(s): LIPASE, AMYLASE in the last  168 hours. No results for input(s): AMMONIA in the last 168 hours. Coagulation Profile: No results for input(s): INR, PROTIME in the last 168 hours. Cardiac Enzymes: No results for input(s): CKTOTAL, CKMB, CKMBINDEX, TROPONINI in the last 168 hours. BNP (last 3 results) No results for input(s): PROBNP in the last 8760 hours. HbA1C: No results for input(s): HGBA1C in the last 72 hours. CBG: No results for input(s): GLUCAP in the last 168 hours. Lipid Profile: No results for input(s): CHOL, HDL, LDLCALC, TRIG, CHOLHDL, LDLDIRECT in the last 72 hours. Thyroid Function Tests: Recent Labs    09/20/19 0454  TSH 0.665   Anemia Panel: Recent Labs    09/19/19 1447 09/19/19 1952  VITAMINB12  --  262  FOLATE 8.9  --   FERRITIN 727*  --   TIBC 340  --   IRON 110  --   RETICCTPCT  --  4.8*   Sepsis Labs: No results for input(s): PROCALCITON, LATICACIDVEN in the last 168 hours.  Recent Results (from the past 240 hour(s))  SARS CORONAVIRUS 2 (TAT 6-24 HRS) Nasopharyngeal Nasopharyngeal Swab     Status: None   Collection Time: 09/19/19  6:30 PM   Specimen: Nasopharyngeal Swab  Result Value Ref Range Status   SARS Coronavirus 2 NEGATIVE NEGATIVE Final    Comment: (NOTE) SARS-CoV-2 target nucleic acids are NOT DETECTED. The SARS-CoV-2 RNA is generally detectable in upper and lower respiratory specimens during the acute phase of infection. Negative results do not preclude SARS-CoV-2 infection, do not rule out co-infections with other pathogens, and should not be used as the sole basis for treatment or other patient management decisions. Negative results must be combined with clinical observations, patient history, and epidemiological information. The expected result is Negative. Fact Sheet for Patients: SugarRoll.be Fact Sheet for Healthcare Providers: https://www.woods-mathews.com/ This test is not yet approved or cleared by the [place] FDA and  has been authorized for detection and/or diagnosis of SARS-CoV-2 by FDA under an Emergency Use Authorization (EUA). This EUA will remain  in effect (meaning this test can be used) for the duration of the COVID-19 declaration under Section 56 4(b)(1) of the Act, 21 U.S.C. section 360bbb-3(b)(1), unless the authorization is terminated or revoked sooner. Performed at [facility], [place] [address] [place], [place] [address]   MRSA PCR Screening     Status: None   Collection Time: 09/20/19  9:30 PM   Specimen: Nasopharyngeal  Result Value Ref Range Status   MRSA by PCR NEGATIVE NEGATIVE Final    Comment:        The GeneXpert MRSA Assay (FDA approved for NASAL specimens only), is one component  of a comprehensive MRSA colonization surveillance program. It is not intended to diagnose MRSA infection nor to guide or monitor treatment for MRSA infections. Performed at [facility], [address] [place], [place] [address]          Radiology Studies: No results found.      Scheduled Meds: . mouth rinse  15 mL Mouth Rinse BID  . sodium chloride flush  3 mL Intravenous Q12H  . triamcinolone ointment   Topical BID   Continuous Infusions: . sodium chloride Stopped (09/20/19 0511)  . sodium chloride       LOS: 1 day    Time spent: 35 mins    Wyvonnia Dusky, MD [facility] Pager [phone]  If 7PM-7AM, please contact night-coverage [email or web address] Password [email or web address] 09/21/2019, 7:57 AM
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
## 2019-09-21 NOTE — Progress Notes (Signed)
   09/21/19 1335  Neurological  Level of Consciousness Alert  Orientation Level Oriented X4  Respiratory  Respiratory Pattern Regular;Unlabored  Bilateral Breath Sounds Clear  Cardiac  Pulse Regular  Heart Sounds S1, S2  pt stable tolerated Hd tx no c/os Avf +/+ ufg 2.5L

## 2019-09-21 NOTE — Progress Notes (Signed)
Talked to Dr. Jimmye Norman about patient not having his binders ordered.  She gave me a verbal order for them.

## 2019-09-22 DIAGNOSIS — N186 End stage renal disease: Secondary | ICD-10-CM

## 2019-09-22 DIAGNOSIS — D631 Anemia in chronic kidney disease: Secondary | ICD-10-CM

## 2019-09-22 LAB — COMPREHENSIVE METABOLIC PANEL
ALT: 9 U/L (ref 0–44)
AST: 12 U/L — ABNORMAL LOW (ref 15–41)
Albumin: 3.4 g/dL — ABNORMAL LOW (ref 3.5–5.0)
Alkaline Phosphatase: 114 U/L (ref 38–126)
Anion gap: 13 (ref 5–15)
BUN: 43 mg/dL — ABNORMAL HIGH (ref 6–20)
CO2: 27 mmol/L (ref 22–32)
Calcium: 8.3 mg/dL — ABNORMAL LOW (ref 8.9–10.3)
Chloride: 101 mmol/L (ref 98–111)
Creatinine, Ser: 9.1 mg/dL — ABNORMAL HIGH (ref 0.61–1.24)
GFR calc Af Amer: 7 mL/min — ABNORMAL LOW (ref >60)
GFR calc non Af Amer: 6 mL/min — ABNORMAL LOW (ref >60)
Glucose, Bld: 102 mg/dL — ABNORMAL HIGH (ref 70–99)
Potassium: 4.3 mmol/L (ref 3.5–5.1)
Sodium: 141 mmol/L (ref 135–145)
Total Bilirubin: 1.5 mg/dL — ABNORMAL HIGH (ref 0.3–1.2)
Total Protein: 7.1 g/dL (ref 6.5–8.1)

## 2019-09-22 LAB — CBC
HCT: 27.7 % — ABNORMAL LOW (ref 39.0–52.0)
Hemoglobin: 8.7 g/dL — ABNORMAL LOW (ref 13.0–17.0)
MCH: 31.5 pg (ref 26.0–34.0)
MCHC: 31.4 g/dL (ref 30.0–36.0)
MCV: 100.4 fL — ABNORMAL HIGH (ref 80.0–100.0)
Platelets: 143 10*3/uL — ABNORMAL LOW (ref 150–400)
RBC: 2.76 MIL/uL — ABNORMAL LOW (ref 4.22–5.81)
RDW: 16.1 % — ABNORMAL HIGH (ref 11.5–15.5)
WBC: 9.5 10*3/uL (ref 4.0–10.5)
nRBC: 0 % (ref 0.0–0.2)

## 2019-09-22 MED ORDER — DIPHENHYDRAMINE HCL 25 MG PO CAPS: 25.0000 mg | ORAL_CAPSULE | Freq: Four times a day (QID) | ORAL | 0 refills | Status: DC | PRN

## 2019-09-22 NOTE — Progress Notes (Signed)
[name], MD [address] [place], [place], [place], [address] [address], [place], [place], [place], [address] Phone: [phone]  Fax: [phone]   Subjective:  No abdominal pain, no signs of active GI bleeding  Objective: Exam: Vital signs in last 24 hours: Vitals:   09/21/19 1404 09/21/19 2015 09/21/19 2028 09/22/19 0455  BP: (!) 168/98  (!) 158/94 (!) 157/94  Pulse: (!) 108  (!) 101 (!) 102  Resp: 20  20 18   Temp: 98.9 F (37.2 C)  98.9 F (37.2 C) 98.6 F (37 C)  TempSrc: Oral  Oral Oral  SpO2: (!) 89% 91% 100% 94%  Weight:      Height:       Weight change:   Intake/Output Summary (Last 24 hours) at 09/22/2019 1417 Last data filed at 09/22/2019 1249 Gross per 24 hour  Intake 720 ml  Output --  Net 720 ml    General: No acute distress, AAO x3 Abd: Soft, NT/ND, No HSM Skin: Warm, no rashes Neck: Supple, Trachea midline   Lab Results: Lab Results  Component Value Date   WBC 9.5 09/22/2019   HGB 8.7 (L) 09/22/2019   HCT 27.7 (L) 09/22/2019   MCV 100.4 (H) 09/22/2019   PLT 143 (L) 09/22/2019   Micro Results: Recent Results (from the past 240 hour(s))  SARS CORONAVIRUS 2 (TAT 6-24 HRS) Nasopharyngeal Nasopharyngeal Swab     Status: None   Collection Time: 09/19/19  6:30 PM   Specimen: Nasopharyngeal Swab  Result Value Ref Range Status   SARS Coronavirus 2 NEGATIVE NEGATIVE Final    Comment: (NOTE) SARS-CoV-2 target nucleic acids are NOT DETECTED. The SARS-CoV-2 RNA is generally detectable in upper and lower respiratory specimens during the acute phase of infection. Negative results do not preclude SARS-CoV-2 infection, do not rule out co-infections with other pathogens, and should not be used as the sole basis for treatment or other patient management decisions. Negative results must be combined with clinical observations, patient history, and epidemiological information. The expected result is Negative. Fact Sheet for  Patients: SugarRoll.be Fact Sheet for Healthcare Providers: https://www.woods-mathews.com/ This test is not yet approved or cleared by the [place] FDA and  has been authorized for detection and/or diagnosis of SARS-CoV-2 by FDA under an Emergency Use Authorization (EUA). This EUA will remain  in effect (meaning this test can be used) for the duration of the COVID-19 declaration under Section 56 4(b)(1) of the Act, 21 U.S.C. section 360bbb-3(b)(1), unless the authorization is terminated or revoked sooner. Performed at [facility], [place] [address] [place], [place] [address]   MRSA PCR Screening     Status: None   Collection Time: 09/20/19  9:30 PM   Specimen: Nasopharyngeal  Result Value Ref Range Status   MRSA by PCR NEGATIVE NEGATIVE Final    Comment:        The GeneXpert MRSA Assay (FDA approved for NASAL specimens only), is one component of a comprehensive MRSA colonization surveillance program. It is not intended to diagnose MRSA infection nor to guide or monitor treatment for MRSA infections. Performed at [facility], [address] [place], [place] [address]    Studies/Results: MR [facility] RIGHT WO CONTRAST  Result Date: 09/21/2019 CLINICAL DATA:  Soft tissue mass in the right leg. EXAM: MRI OF THE RIGHT FEMUR WITHOUT CONTRAST TECHNIQUE: Multiplanar, multisequence MR imaging of the right femur was performed. No intravenous contrast was administered. COMPARISON:  None. FINDINGS: Bones/Joint/Cartilage No fracture or  dislocation. Normal alignment. Small joint effusion. Minimal osteoarthritis of the right hip with mild subchondral reactive marrow changes. Subchondral serpiginous signal abnormality in the right medial femoral condyle with severe surrounding marrow edema which may reflect subchondral osteonecrosis versus a bone infarct. No aggressive osseous lesion. Ligaments Collateral ligaments are intact. Muscles and  Tendons Muscles are normal. No muscle atrophy. No intramuscular fluid collection or hematoma. Soft tissue No fluid collection or hematoma.  No soft tissue mass. IMPRESSION: 1. No soft tissue mass at the site of clinical palpable abnormality in the anterior aspect of the right thigh. 2. Subchondral serpiginous signal abnormality in the right medial femoral condyle with severe surrounding marrow edema which may reflect subchondral osteonecrosis versus a bone infarct. Electronically Signed   By: Kathreen Devoid   On: 09/21/2019 17:57   Medications:  Scheduled Meds: . calcium acetate  [id number] mg Oral TID WC  . Chlorhexidine Gluconate Cloth  6 each Topical Q0600  . epoetin (EPOGEN/PROCRIT) injection  4,000 Units Intravenous Q M,W,F-HD  . mouth rinse  15 mL Mouth Rinse BID  . sodium chloride flush  3 mL Intravenous Q12H  . triamcinolone ointment   Topical BID   Continuous Infusions: . sodium chloride Stopped (09/20/19 0511)  . sodium chloride     PRN Meds:.sodium chloride, acetaminophen **OR** acetaminophen, ALPRAZolam, diphenhydrAMINE, hydrALAZINE, HYDROcodone-acetaminophen, labetalol, sodium chloride flush   Assessment: Active Problems:   ESRD on dialysis [facility])   Essential hypertension   Anemia in ESRD (end-stage renal disease) (HCC)   Prolonged QT interval   Symptomatic anemia   Pruritus   Hyperbilirubinemia   Leg mass, right    Plan: Hemoglobin is stable at this time  Would recommend hematology evaluation for further work-up for microcytic anemia  Official vascular consult also recommended due to CT findings  Elective endoscopic procedures to be done after hematology work-up is complete  Patient does not have iron deficiency and no signs of active GI bleeding, therefore no indication for urgent endoscopy at this time   LOS: 2 days   [name], MD 09/22/2019, 2:17 PM
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
## 2019-09-22 NOTE — Progress Notes (Signed)
Pt discharged per MD order. IV removed. Discharge instructions reviewed with pt. Pt verbalized understanding. All questions answered to pt satisfaction. Pt taken downstairs in wheelchair by staff.  

## 2019-09-23 LAB — TYPE AND SCREEN
ABO/RH(D): O NEG
Antibody Screen: POSITIVE
DAT, IgG: NEGATIVE
DAT, complement: NEGATIVE
Donor AG Type: NEGATIVE
Donor AG Type: NEGATIVE
Donor AG Type: NEGATIVE
Unit division: 0
Unit division: 0
Unit division: 0
Unit division: 0
Unit division: 0

## 2019-09-23 LAB — BPAM RBC
Blood Product Expiration Date: 202101132359
Blood Product Expiration Date: 202101132359
Blood Product Expiration Date: 202101142359
Blood Product Expiration Date: 202101142359
Blood Product Expiration Date: 202101182359
ISSUE DATE / TIME: 202012290024
ISSUE DATE / TIME: 202012290919
ISSUE DATE / TIME: 202012291306
Unit Type and Rh: 9500
Unit Type and Rh: 9500
Unit Type and Rh: 9500
Unit Type and Rh: 9500
Unit Type and Rh: 9500

## 2019-10-01 ENCOUNTER — Other Ambulatory Visit: Payer: Self-pay

## 2019-10-01 ENCOUNTER — Non-Acute Institutional Stay (HOSPITAL_COMMUNITY)
Admission: EM | Admit: 2019-10-01 | Discharge: 2019-10-01 | Disposition: A | Discharge status: Home / Self Care | Payer: Medicare Other | Attending: Nephrology | Admitting: Nephrology

## 2019-10-01 ENCOUNTER — Encounter (HOSPITAL_COMMUNITY): Payer: Self-pay | Admitting: Emergency Medicine

## 2019-10-01 ENCOUNTER — Emergency Department (HOSPITAL_COMMUNITY): Payer: Medicare Other

## 2019-10-01 DIAGNOSIS — Z20822 Contact with and (suspected) exposure to covid-19: Secondary | ICD-10-CM | POA: Diagnosis not present

## 2019-10-01 DIAGNOSIS — Z992 Dependence on renal dialysis: Secondary | ICD-10-CM | POA: Diagnosis not present

## 2019-10-01 DIAGNOSIS — L299 Pruritus, unspecified: Secondary | ICD-10-CM

## 2019-10-01 DIAGNOSIS — N529 Male erectile dysfunction, unspecified: Secondary | ICD-10-CM | POA: Diagnosis not present

## 2019-10-01 DIAGNOSIS — F17210 Nicotine dependence, cigarettes, uncomplicated: Secondary | ICD-10-CM | POA: Insufficient documentation

## 2019-10-01 DIAGNOSIS — D539 Nutritional anemia, unspecified: Secondary | ICD-10-CM | POA: Insufficient documentation

## 2019-10-01 DIAGNOSIS — I120 Hypertensive chronic kidney disease with stage 5 chronic kidney disease or end stage renal disease: Secondary | ICD-10-CM | POA: Diagnosis not present

## 2019-10-01 DIAGNOSIS — D631 Anemia in chronic kidney disease: Secondary | ICD-10-CM | POA: Insufficient documentation

## 2019-10-01 DIAGNOSIS — J810 Acute pulmonary edema: Secondary | ICD-10-CM | POA: Insufficient documentation

## 2019-10-01 DIAGNOSIS — N186 End stage renal disease: Secondary | ICD-10-CM | POA: Diagnosis not present

## 2019-10-01 DIAGNOSIS — Z79899 Other long term (current) drug therapy: Secondary | ICD-10-CM | POA: Diagnosis not present

## 2019-10-01 LAB — CBC WITH DIFFERENTIAL/PLATELET
Abs Immature Granulocytes: 0.08 10*3/uL — ABNORMAL HIGH (ref 0.00–0.07)
Basophils Absolute: 0 10*3/uL (ref 0.0–0.1)
Basophils Relative: 0 %
Eosinophils Absolute: 0.4 10*3/uL (ref 0.0–0.5)
Eosinophils Relative: 4 %
HCT: 21.1 % — ABNORMAL LOW (ref 39.0–52.0)
Hemoglobin: 6.8 g/dL — CL (ref 13.0–17.0)
Immature Granulocytes: 1 %
Lymphocytes Relative: 10 %
Lymphs Abs: 1 10*3/uL (ref 0.7–4.0)
MCH: 33.5 pg (ref 26.0–34.0)
MCHC: 32.2 g/dL (ref 30.0–36.0)
MCV: 103.9 fL — ABNORMAL HIGH (ref 80.0–100.0)
Monocytes Absolute: 0.6 10*3/uL (ref 0.1–1.0)
Monocytes Relative: 6 %
Neutro Abs: 7.7 10*3/uL (ref 1.7–7.7)
Neutrophils Relative %: 79 %
Platelets: 155 10*3/uL (ref 150–400)
RBC: 2.03 MIL/uL — ABNORMAL LOW (ref 4.22–5.81)
RDW: 14.3 % (ref 11.5–15.5)
WBC: 9.6 10*3/uL (ref 4.0–10.5)
nRBC: 0 % (ref 0.0–0.2)

## 2019-10-01 LAB — BASIC METABOLIC PANEL
Anion gap: 19 — ABNORMAL HIGH (ref 5–15)
BUN: 54 mg/dL — ABNORMAL HIGH (ref 6–20)
CO2: 24 mmol/L (ref 22–32)
Calcium: 8.4 mg/dL — ABNORMAL LOW (ref 8.9–10.3)
Chloride: 96 mmol/L — ABNORMAL LOW (ref 98–111)
Creatinine, Ser: 12.23 mg/dL — ABNORMAL HIGH (ref 0.61–1.24)
GFR calc Af Amer: 5 mL/min — ABNORMAL LOW (ref >60)
GFR calc non Af Amer: 4 mL/min — ABNORMAL LOW (ref >60)
Glucose, Bld: 100 mg/dL — ABNORMAL HIGH (ref 70–99)
Potassium: 4.2 mmol/L (ref 3.5–5.1)
Sodium: 139 mmol/L (ref 135–145)

## 2019-10-01 LAB — MAGNESIUM: Magnesium: 2 mg/dL (ref 1.7–2.4)

## 2019-10-01 LAB — RESPIRATORY PANEL BY RT PCR (FLU A&B, COVID)
Influenza A by PCR: NEGATIVE
Influenza B by PCR: NEGATIVE
SARS Coronavirus 2 by RT PCR: NEGATIVE

## 2019-10-01 LAB — PREPARE RBC (CROSSMATCH)

## 2019-10-01 IMAGING — DX DG CHEST 1V PORT
1 series · 1 of 1 positions shown · non-contrast
Comparison: [DATE]

CLINICAL DATA: Dyspnea

EXAM:
PORTABLE CHEST 1 VIEW

[chest ap]
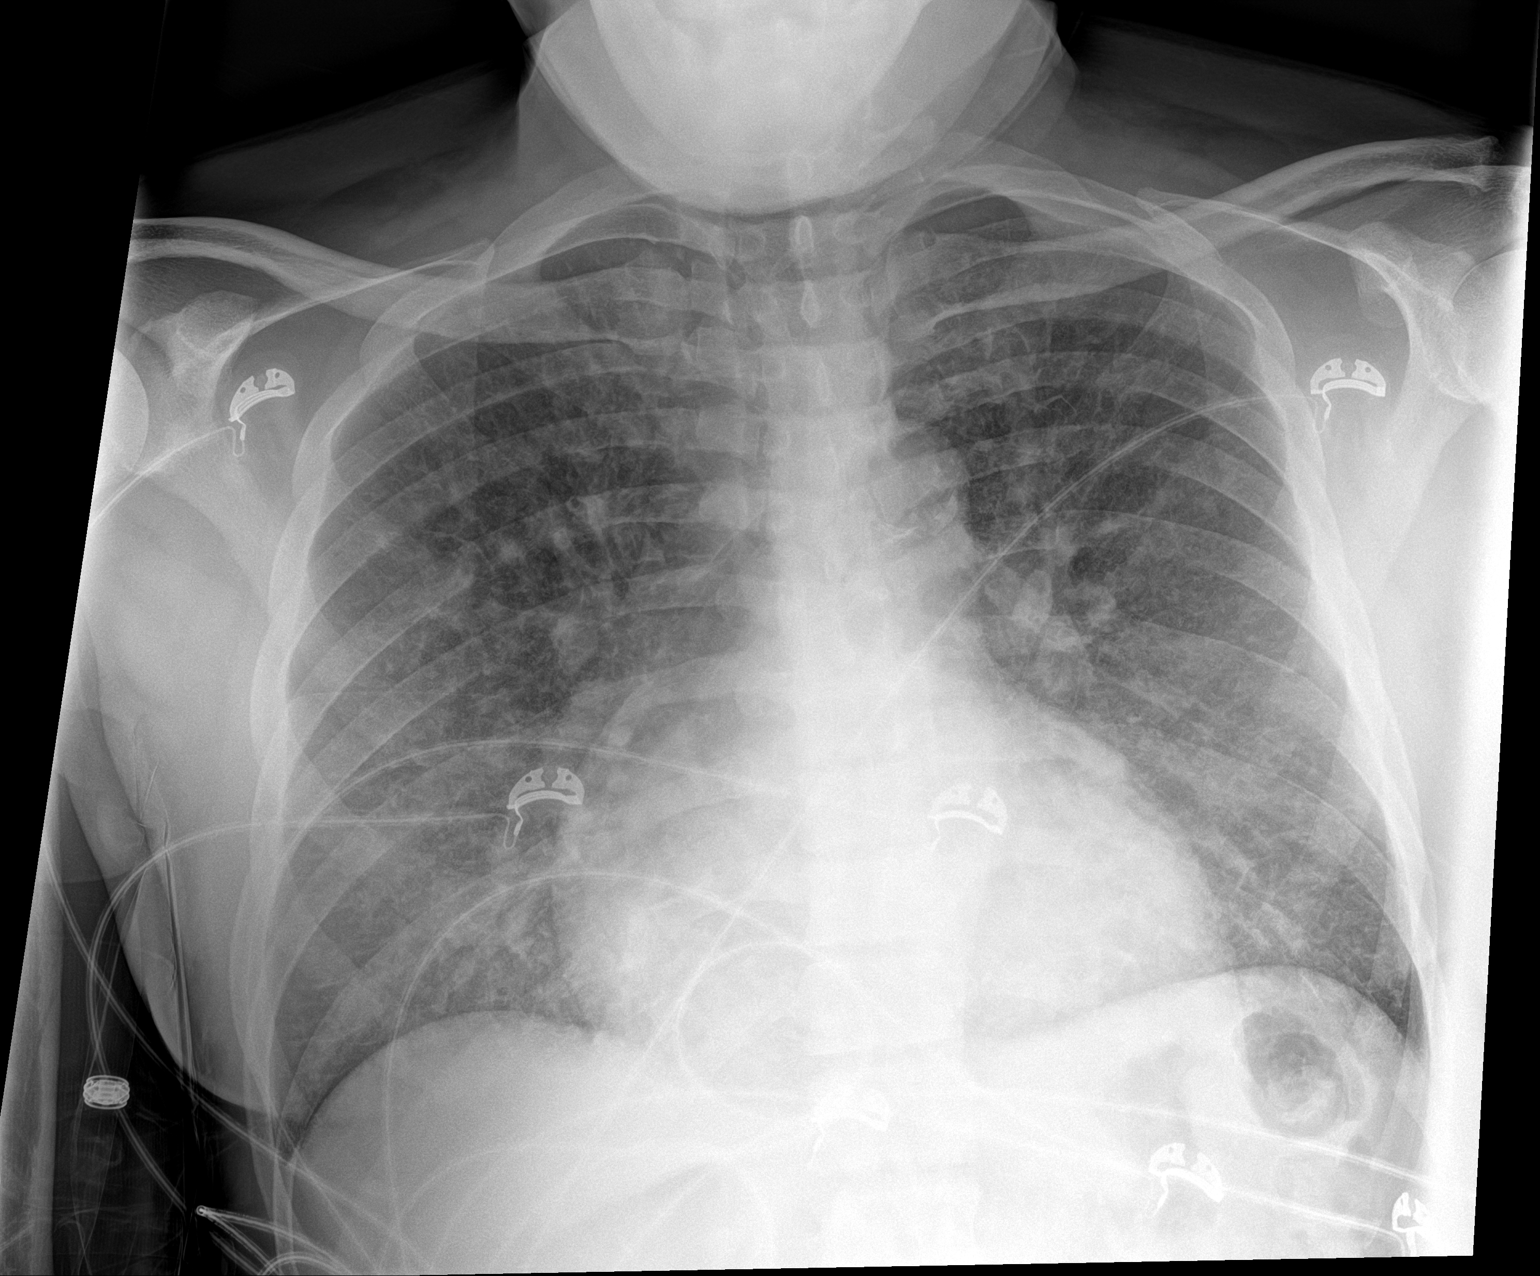

[1 of 1 positions shown; findings below may reference images not displayed]

FINDINGS: Mild cardiomegaly. Mild interstitial opacities. No pleural effusion
or pneumothorax. No focal airspace consolidation.
IMPRESSION: Mild cardiomegaly and mild interstitial edema.

## 2019-10-01 MED ORDER — DOXEPIN HCL 10 MG PO CAPS
10.0000 mg | ORAL_CAPSULE | Freq: Once | ORAL | Status: AC
  Administered 2019-10-01: 10 mg via ORAL
  Filled 2019-10-01: qty 1

## 2019-10-01 MED ORDER — FAMOTIDINE 20 MG PO TABS
20.0000 mg | ORAL_TABLET | Freq: Once | ORAL | Status: AC
  Administered 2019-10-01: 20 mg via ORAL
  Filled 2019-10-01: qty 1

## 2019-10-01 MED ORDER — DIPHENHYDRAMINE HCL 25 MG PO CAPS
50.0000 mg | ORAL_CAPSULE | Freq: Once | ORAL | Status: AC
  Administered 2019-10-01: 50 mg via ORAL
  Filled 2019-10-01: qty 2

## 2019-10-01 MED ORDER — CHLORHEXIDINE GLUCONATE CLOTH 2 % EX PADS: 6.0000 | MEDICATED_PAD | Freq: Every day | CUTANEOUS | Status: DC

## 2019-10-01 MED ORDER — SODIUM CHLORIDE 0.9% IV SOLUTION: Freq: Once | INTRAVENOUS | Status: DC

## 2019-10-01 NOTE — ED Provider Notes (Signed)
54 year old male ESRD on dialysispresents to ER with concern for shortness of breath.  Missed dialysis yesterday, had been scheduled for dialysis this morning.  CXR noted mild pulmonary edema, concern for fluid overload, need for dialysis.  No hyper K, hemoglobin slight drop, history of chronic anemia.  Care was discussed with nephrology on-call, Dr. Royce Macadamia who will arrange for urgent dialysis.  She also ordered unit of blood.  7:30AM Received signout from Dr. Roxanne Mins, anticipate discharge after dialysis  3:38 PM Signed out to Dr. Vanita Panda, has not returned from dialysis yet   Lucrezia Starch, MD 10/01/19 1538

## 2019-10-01 NOTE — ED Notes (Signed)
Walked patient to the bathroom patient did well patient is now back in bed on the monitor  

## 2019-10-01 NOTE — ED Provider Notes (Addendum)
[facility] EMERGENCY DEPARTMENT Provider Note   CSN: [id number] Arrival date & time: 10/01/19  [id number]   History Chief Complaint  Patient presents with  . Shortness of Breath    Jordan [name] is a 54 y.o. male.  The history is provided by the patient.  Shortness of Breath He has history of hypertension, end-stage renal disease on hemodialysis, prolonged QT interval and comes in because of dyspnea.  He had missed his dialysis session yesterday and is scheduled to have his dialysis done at 5:45 AM today.  He has had dyspnea for about the last 2 days which is worse when supine.  He has also had a cough productive of some green sputum.  He denies fever or chills, although EMS reports the measured his temperature at 101.3.  He denies nausea, vomiting, diarrhea.  He denies arthralgias or myalgias.  There has been no loss of sense of smell or taste.  He denies exposure to COVID-19.  He denies chest pain, heaviness, tightness, pressure.  He denies diaphoresis.  Of note, he did receive a blood transfusion about a week ago.  He also is complaining of generalized itching which has been going on for over a month, has failed to respond to oral diphenhydramine.  EMS noted oxygen saturation of 80% on room air.  Past Medical History:  Diagnosis Date  . Anemia   . CKD (chronic kidney disease)    Stage 5  Dialysis - M/W/F in [place], [place]  . Dyspnea    tx with inhaler when sick  . ED (erectile dysfunction)   . ETOH abuse   . History of blood transfusion   . Hypertension   . Wears dentures     Patient Active Problem List   Diagnosis Date Noted  . Leg mass, right   . Hyperbilirubinemia   . Prolonged QT interval 09/19/2019  . Symptomatic anemia 09/19/2019  . Pruritus 09/19/2019  . Anemia in ESRD (end-stage renal disease) ([place]) 05/17/2018  . Hepatitis B 03/19/2018  . ESRD on dialysis ([place]) 05/26/2017  . Essential hypertension 05/26/2017    Past Surgical History:  Procedure  Laterality Date  . BASCILIC VEIN TRANSPOSITION Left 08/07/2016   Procedure: LEFT BASILIC VEIN TRANSPOSITION;  Surgeon: Angelia Mould, MD;  Location: [place];  Service: Vascular;  Laterality: Left;  . CLOSED REDUCTION NASAL FRACTURE N/A 08/11/2019   Procedure: CLOSED REDUCTION NASAL FRACTURE WITH STABILIZATION;  Surgeon: Irene Limbo, MD;  Location: [place];  Service: Plastics;  Laterality: N/A;  . COLONOSCOPY N/A 08/12/2016   Procedure: COLONOSCOPY;  Surgeon: Otis Brace, MD;  Location: [place];  Service: Gastroenterology;  Laterality: N/A;  . ESOPHAGOGASTRODUODENOSCOPY N/A 08/12/2016   Procedure: ESOPHAGOGASTRODUODENOSCOPY (EGD);  Surgeon: Otis Brace, MD;  Location: [place];  Service: Gastroenterology;  Laterality: N/A;  . INSERTION OF DIALYSIS CATHETER N/A 08/07/2016   Procedure: INSERTION OF TUNNELED DIALYSIS CATHETER;  Surgeon: Angelia Mould, MD;  Location: [place];  Service: Vascular;  Laterality: N/A;  . LIGATION OF ARTERIOVENOUS  FISTULA Left 09/12/2016   Procedure: BANDING OF LEFT  ARTERIOVENOUS  FISTULA;  Surgeon: Angelia Mould, MD;  Location: [place];  Service: Vascular;  Laterality: Left;  . LOWER EXTREMITY INTERVENTION Right 12/02/2018   Procedure: LOWER EXTREMITY INTERVENTION;  Surgeon: Algernon Huxley, MD;  Location: [place] [facility];  Service: Cardiovascular;  Laterality: Right;       Family History  Problem Relation Age of Onset  . Diabetes Mother   . Kidney  failure Mother   . Kidney failure Brother   . Post-traumatic stress disorder Neg Hx   . Bladder Cancer Neg Hx   . Kidney cancer Neg Hx     Social History   Tobacco Use  . Smoking status: Current Every Day Smoker    Packs/day: 0.50    Years: 15.00    Pack years: 7.50    Types: Cigarettes  . Smokeless tobacco: Never Used  Substance Use Topics  . Alcohol use: Yes    Alcohol/week: 21.0 standard drinks    Types: 21 Cans of beer per week    Comment: none since 08/04/16   . Drug use: No    Home Medications Prior to Admission medications   Medication Sig Start Date End Date Taking? Authorizing Provider  albuterol (PROVENTIL HFA;VENTOLIN HFA) 108 (90 Base) MCG/ACT inhaler Inhale 2 puffs into the lungs every 4 (four) hours as needed for wheezing or shortness of breath. 10/03/18   Orpah Greek, MD  calcium acetate (PHOSLO) 667 MG capsule Take 2 capsules (1,334 mg total) by mouth 3 (three) times daily with meals. Patient taking differently: Take [phone] mg by mouth See admin instructions. Take [phone] with each meal take 667 mg with each snack 02/12/19   Fritzi Mandes, MD  diphenhydrAMINE (BENADRYL) 25 mg capsule Take 1 capsule (25 mg total) by mouth every 6 (six) hours as needed for itching. 09/22/19 10/22/19  Wyvonnia Dusky, MD  hydrOXYzine (ATARAX/VISTARIL) 50 MG tablet Take 50 mg by mouth 3 (three) times daily. 08/09/19   [provider]  sildenafil (VIAGRA) 50 MG tablet Take 50 mg by mouth daily as needed for erectile dysfunction. 07/20/19   [provider]    Allergies    Patient has no known allergies.  Review of Systems   Review of Systems  Respiratory: Positive for shortness of breath.   All other systems reviewed and are negative.   Physical Exam Updated Vital Signs BP (!) 183/105 (BP Location: Right Arm)   Pulse (!) 116   Temp 98.5 F (36.9 C) (Oral)   Resp (!) 26   SpO2 96%   Physical Exam Vitals and nursing note reviewed.   54 year old male, resting comfortably and in no acute distress. Vital signs are significant for elevated heart rate, respiratory rate, blood pressure. Oxygen saturation is 88%, which is hypoxic.  He is placed on nasal oxygen with saturation increasing to 96%. Head is normocephalic and atraumatic. PERRLA, EOMI. Oropharynx is clear. Neck is nontender and supple without adenopathy. JVD is present. Back is nontender and there is no CVA tenderness. Lungs have fine rales at the bases  bilaterally.  There are no wheezes or rhonchi. Chest is nontender. Heart has regular rate and rhythm without murmur. Abdomen is soft, flat, nontender without masses or hepatosplenomegaly and peristalsis is normoactive. Extremities have no cyanosis or edema, full range of motion is present.  AV fistula is present in the left upper arm with thrill present. Skin is warm and dry without rash. Neurologic: Mental status is normal, cranial nerves are intact, there are no motor or sensory deficits.  ED Results / Procedures / Treatments   Labs (all labs ordered are listed, but only abnormal results are displayed) Labs Reviewed  BASIC METABOLIC PANEL - Abnormal; Notable for the following components:      Result Value   Chloride 96 (*)    Glucose, Bld 100 (*)    BUN 54 (*)    Creatinine, Ser 12.23 (*)  Calcium 8.4 (*)    GFR calc non Af Amer 4 (*)    GFR calc Af Amer 5 (*)    Anion gap 19 (*)    All other components within normal limits  CBC WITH DIFFERENTIAL/PLATELET - Abnormal; Notable for the following components:   RBC 2.03 (*)    Hemoglobin 6.8 (*)    HCT 21.1 (*)    MCV 103.9 (*)    Abs Immature Granulocytes 0.08 (*)    All other components within normal limits  RESPIRATORY PANEL BY RT PCR (FLU A&B, COVID)  MAGNESIUM  PREPARE RBC (CROSSMATCH)  TYPE AND SCREEN    EKG EKG Interpretation  Date/Time:  Saturday October 01 2019 03:50:30 EST Ventricular Rate:  114 PR Interval:    QRS Duration: 84 QT Interval:  367 QTC Calculation: 506 R Axis:   61 Text Interpretation: Sinus tachycardia Ventricular premature complex Aberrant conduction of SV complex(es) RSR' in V1 or V2, probably normal variant Prolonged QT interval Baseline wander in lead(s) II When compared with ECG of 09/19/2019, QT has shortened Confirmed by Delora Fuel ([id number]) on 10/01/2019 3:59:41 AM   Radiology DG Chest Port 1 View  Result Date: 10/01/2019 CLINICAL DATA:  Dyspnea EXAM: PORTABLE CHEST 1 VIEW COMPARISON:   07/22/2019 FINDINGS: Mild cardiomegaly. Mild interstitial opacities. No pleural effusion or pneumothorax. No focal airspace consolidation. IMPRESSION: Mild cardiomegaly and mild interstitial edema. Electronically Signed   By: Ulyses Jarred M.D.   On: 10/01/2019 04:38    Procedures Procedures  CRITICAL CARE Performed by: Delora Fuel Total critical care time: 40 minutes Critical care time was exclusive of separately billable procedures and treating other patients. Critical care was necessary to treat or prevent imminent or life-threatening deterioration. Critical care was time spent personally by me on the following activities: development of treatment plan with patient and/or surrogate as well as nursing, discussions with consultants, evaluation of patient's response to treatment, examination of patient, obtaining history from patient or surrogate, ordering and performing treatments and interventions, ordering and review of laboratory studies, ordering and review of radiographic studies, pulse oximetry and re-evaluation of patient's condition.  Medications Ordered in ED Medications  Chlorhexidine Gluconate Cloth 2 % PADS 6 each (has no administration in time range)  0.9 %  sodium chloride infusion (Manually program via Guardrails IV Fluids) (has no administration in time range)  doxepin (SINEQUAN) capsule 10 mg (10 mg Oral Given 10/01/19 0430)  diphenhydrAMINE (BENADRYL) capsule 50 mg (50 mg Oral Given 10/01/19 0556)  famotidine (PEPCID) tablet 20 mg (20 mg Oral Given 10/01/19 0556)    ED Course  I have reviewed the triage vital signs and the nursing notes.  Pertinent labs & imaging results that were available during my care of the patient were reviewed by me and considered in my medical decision making (see chart for details).  MDM Rules/Calculators/A&P Dyspnea with hypoxia most likely secondary to fluid overload given his recent blood transfusion and missing dialysis session.  Also consider  possibility of pneumonia including COVID-19.  No reason to suspect pulmonary embolism.  Old records are reviewed showing prior visits for urticaria and pruritus, prior admission for respiratory failure and need for emergent dialysis.  Chest x-ray is consistent with mild pulmonary edema.  Labs show no hyperkalemia, but worsening anemia.  He will likely need a transfusion in the next 1-2 weeks.  Case is discussed with Dr. Royce Macadamia of nephrology service who agrees to admit the patient for urgent dialysis.  COVID-19 PCR is negative.  Also, he failed to get relief of itching with doxepin and is given a dose of diphenhydramine plus famotidine.  Jordan [name] was evaluated in Emergency Department on 10/01/2019 for the symptoms described in the history of present illness. He was evaluated in the context of the global COVID-19 pandemic, which necessitated consideration that the patient might be at risk for infection with the SARS-CoV-2 virus that causes COVID-19. Institutional protocols and algorithms that pertain to the evaluation of patients at risk for COVID-19 are in a state of rapid change based on information released by regulatory bodies including the CDC and federal and state organizations. These policies and algorithms were followed during the patient's care in the ED.  Final Clinical Impression(s) / ED Diagnoses Final diagnoses:  Acute pulmonary edema ([place])  End-stage renal disease on hemodialysis ([place])  Macrocytic anemia    Rx / DC Orders ED Discharge Orders    None       Delora Fuel, MD [id number] [phone]    Delora Fuel, MD [id number] [phone]

## 2019-10-01 NOTE — ED Notes (Signed)
Gave patient his lunch tray patient is resting with call bell in reach

## 2019-10-01 NOTE — ED Notes (Signed)
Nephrology paged again.

## 2019-10-01 NOTE — ED Notes (Signed)
Called Hemodialysis.  They stated to go ahead and give blood b/c pt is not due for dialysis until later on today.

## 2019-10-01 NOTE — ED Notes (Signed)
Date and time results received: 10/01/19  (use smartphrase ".now" to insert current time)  Test: Hg  Critical Value: 6.8 Name of Provider Notified: Roxanne Mins

## 2019-10-01 NOTE — ED Notes (Signed)
Nephrology paged.

## 2019-10-01 NOTE — ED Triage Notes (Signed)
HD pt brought to ED by [facility] from home for c/o SOB x 3 days, fever and persistent cought, pt miss HD yesterday. HD treatment MWF. Pt has a blood transfusion a week ago. 80% on RA placed on NRM by EMS with a SPO2 up to 100%. Per EMS fever 1013. BP 164/82, HR 110.

## 2019-10-01 NOTE — ED Notes (Signed)
Called Dialysis.  They stated they have had a lot of emergencies and his time has been pushed to later this afternoon/early evening.  They stated to hold the 2nd unit of blood until he gets to dialysis.

## 2019-10-01 NOTE — Discharge Instructions (Addendum)
Please be sure to maintain your dialysis schedule.  Return here for concerning changes in your condition.

## 2019-10-01 NOTE — ED Notes (Signed)
Got patient a turkey sandwich patient is resting with call bell in reach 

## 2019-10-01 NOTE — ED Notes (Signed)
Went to check on patient and patient was sitting in the chair on the telephone

## 2019-10-02 LAB — TYPE AND SCREEN
ABO/RH(D): O NEG
Antibody Screen: POSITIVE
DAT, IgG: NEGATIVE
Donor AG Type: NEGATIVE
Donor AG Type: NEGATIVE
Unit division: 0
Unit division: 0

## 2019-10-02 LAB — BPAM RBC
Blood Product Expiration Date: 202101142359
Blood Product Expiration Date: 202101182359
ISSUE DATE / TIME: 202101090953
ISSUE DATE / TIME: 202101091548
Unit Type and Rh: 9500
Unit Type and Rh: 9500

## 2019-10-05 ENCOUNTER — Inpatient Hospital Stay (HOSPITAL_COMMUNITY)
Admission: EM | Admit: 2019-10-05 | Discharge: 2019-10-07 | DRG: 640 | Disposition: A | Discharge status: Home / Self Care | Payer: Medicare Other | Attending: Internal Medicine | Admitting: Internal Medicine

## 2019-10-05 ENCOUNTER — Other Ambulatory Visit: Payer: Self-pay

## 2019-10-05 ENCOUNTER — Emergency Department (HOSPITAL_COMMUNITY): Payer: Medicare Other

## 2019-10-05 DIAGNOSIS — E8770 Fluid overload, unspecified: Principal | ICD-10-CM | POA: Diagnosis present

## 2019-10-05 DIAGNOSIS — E876 Hypokalemia: Secondary | ICD-10-CM | POA: Diagnosis present

## 2019-10-05 DIAGNOSIS — F17210 Nicotine dependence, cigarettes, uncomplicated: Secondary | ICD-10-CM | POA: Diagnosis present

## 2019-10-05 DIAGNOSIS — Z833 Family history of diabetes mellitus: Secondary | ICD-10-CM

## 2019-10-05 DIAGNOSIS — R0603 Acute respiratory distress: Secondary | ICD-10-CM | POA: Diagnosis present

## 2019-10-05 DIAGNOSIS — L299 Pruritus, unspecified: Secondary | ICD-10-CM | POA: Diagnosis present

## 2019-10-05 DIAGNOSIS — Z9115 Patient's noncompliance with renal dialysis: Secondary | ICD-10-CM

## 2019-10-05 DIAGNOSIS — Z841 Family history of disorders of kidney and ureter: Secondary | ICD-10-CM

## 2019-10-05 DIAGNOSIS — I120 Hypertensive chronic kidney disease with stage 5 chronic kidney disease or end stage renal disease: Secondary | ICD-10-CM | POA: Diagnosis present

## 2019-10-05 DIAGNOSIS — B1910 Unspecified viral hepatitis B without hepatic coma: Secondary | ICD-10-CM | POA: Diagnosis present

## 2019-10-05 DIAGNOSIS — R0902 Hypoxemia: Secondary | ICD-10-CM

## 2019-10-05 DIAGNOSIS — R17 Unspecified jaundice: Secondary | ICD-10-CM | POA: Diagnosis present

## 2019-10-05 DIAGNOSIS — J810 Acute pulmonary edema: Secondary | ICD-10-CM | POA: Diagnosis present

## 2019-10-05 DIAGNOSIS — D631 Anemia in chronic kidney disease: Secondary | ICD-10-CM | POA: Diagnosis present

## 2019-10-05 DIAGNOSIS — N186 End stage renal disease: Secondary | ICD-10-CM

## 2019-10-05 DIAGNOSIS — I10 Essential (primary) hypertension: Secondary | ICD-10-CM | POA: Diagnosis present

## 2019-10-05 DIAGNOSIS — Z20822 Contact with and (suspected) exposure to covid-19: Secondary | ICD-10-CM | POA: Diagnosis present

## 2019-10-05 DIAGNOSIS — E8889 Other specified metabolic disorders: Secondary | ICD-10-CM | POA: Diagnosis present

## 2019-10-05 DIAGNOSIS — I959 Hypotension, unspecified: Secondary | ICD-10-CM | POA: Diagnosis present

## 2019-10-05 DIAGNOSIS — Z992 Dependence on renal dialysis: Secondary | ICD-10-CM

## 2019-10-05 LAB — CBC WITH DIFFERENTIAL/PLATELET
Abs Immature Granulocytes: 0.19 10*3/uL — ABNORMAL HIGH (ref 0.00–0.07)
Basophils Absolute: 0 10*3/uL (ref 0.0–0.1)
Basophils Relative: 0 %
Eosinophils Absolute: 0.3 10*3/uL (ref 0.0–0.5)
Eosinophils Relative: 4 %
HCT: 24.8 % — ABNORMAL LOW (ref 39.0–52.0)
Hemoglobin: 8.1 g/dL — ABNORMAL LOW (ref 13.0–17.0)
Immature Granulocytes: 2 %
Lymphocytes Relative: 12 %
Lymphs Abs: 1.1 10*3/uL (ref 0.7–4.0)
MCH: 32.9 pg (ref 26.0–34.0)
MCHC: 32.7 g/dL (ref 30.0–36.0)
MCV: 100.8 fL — ABNORMAL HIGH (ref 80.0–100.0)
Monocytes Absolute: 0.6 10*3/uL (ref 0.1–1.0)
Monocytes Relative: 6 %
Neutro Abs: 7.6 10*3/uL (ref 1.7–7.7)
Neutrophils Relative %: 76 %
Platelets: 155 10*3/uL (ref 150–400)
RBC: 2.46 MIL/uL — ABNORMAL LOW (ref 4.22–5.81)
RDW: 15.7 % — ABNORMAL HIGH (ref 11.5–15.5)
WBC: 9.8 10*3/uL (ref 4.0–10.5)
nRBC: 0.2 % (ref 0.0–0.2)

## 2019-10-05 LAB — TROPONIN I (HIGH SENSITIVITY)
Troponin I (High Sensitivity): 134 ng/L (ref <18)
Troponin I (High Sensitivity): 114 ng/L (ref <18)

## 2019-10-05 LAB — PROTIME-INR
INR: 1.4 — ABNORMAL HIGH (ref 0.8–1.2)
Prothrombin Time: 17.3 seconds — ABNORMAL HIGH (ref 11.4–15.2)

## 2019-10-05 LAB — COMPREHENSIVE METABOLIC PANEL
ALT: 41 U/L (ref 0–44)
AST: 37 U/L (ref 15–41)
Albumin: 3.1 g/dL — ABNORMAL LOW (ref 3.5–5.0)
Alkaline Phosphatase: 155 U/L — ABNORMAL HIGH (ref 38–126)
Anion gap: 16 — ABNORMAL HIGH (ref 5–15)
BUN: 71 mg/dL — ABNORMAL HIGH (ref 6–20)
CO2: 27 mmol/L (ref 22–32)
Calcium: 8.4 mg/dL — ABNORMAL LOW (ref 8.9–10.3)
Chloride: 96 mmol/L — ABNORMAL LOW (ref 98–111)
Creatinine, Ser: 11.29 mg/dL — ABNORMAL HIGH (ref 0.61–1.24)
GFR calc Af Amer: 5 mL/min — ABNORMAL LOW (ref >60)
GFR calc non Af Amer: 5 mL/min — ABNORMAL LOW (ref >60)
Glucose, Bld: 124 mg/dL — ABNORMAL HIGH (ref 70–99)
Potassium: 3.1 mmol/L — ABNORMAL LOW (ref 3.5–5.1)
Sodium: 139 mmol/L (ref 135–145)
Total Bilirubin: 2.5 mg/dL — ABNORMAL HIGH (ref 0.3–1.2)
Total Protein: 6.5 g/dL (ref 6.5–8.1)

## 2019-10-05 LAB — LACTIC ACID, PLASMA: Lactic Acid, Venous: 1.8 mmol/L (ref 0.5–1.9)

## 2019-10-05 LAB — POC SARS CORONAVIRUS 2 AG -  ED: SARS Coronavirus 2 Ag: NEGATIVE

## 2019-10-05 LAB — RESPIRATORY PANEL BY RT PCR (FLU A&B, COVID)
Influenza A by PCR: NEGATIVE
Influenza B by PCR: NEGATIVE
SARS Coronavirus 2 by RT PCR: NEGATIVE

## 2019-10-05 IMAGING — DX DG CHEST 1V PORT
1 series · 1 of 1 positions shown · non-contrast
Comparison: [DATE], CT [DATE], [DATE], [DATE]

CLINICAL DATA: Short of breath

EXAM:
PORTABLE CHEST 1 VIEW

[chest]
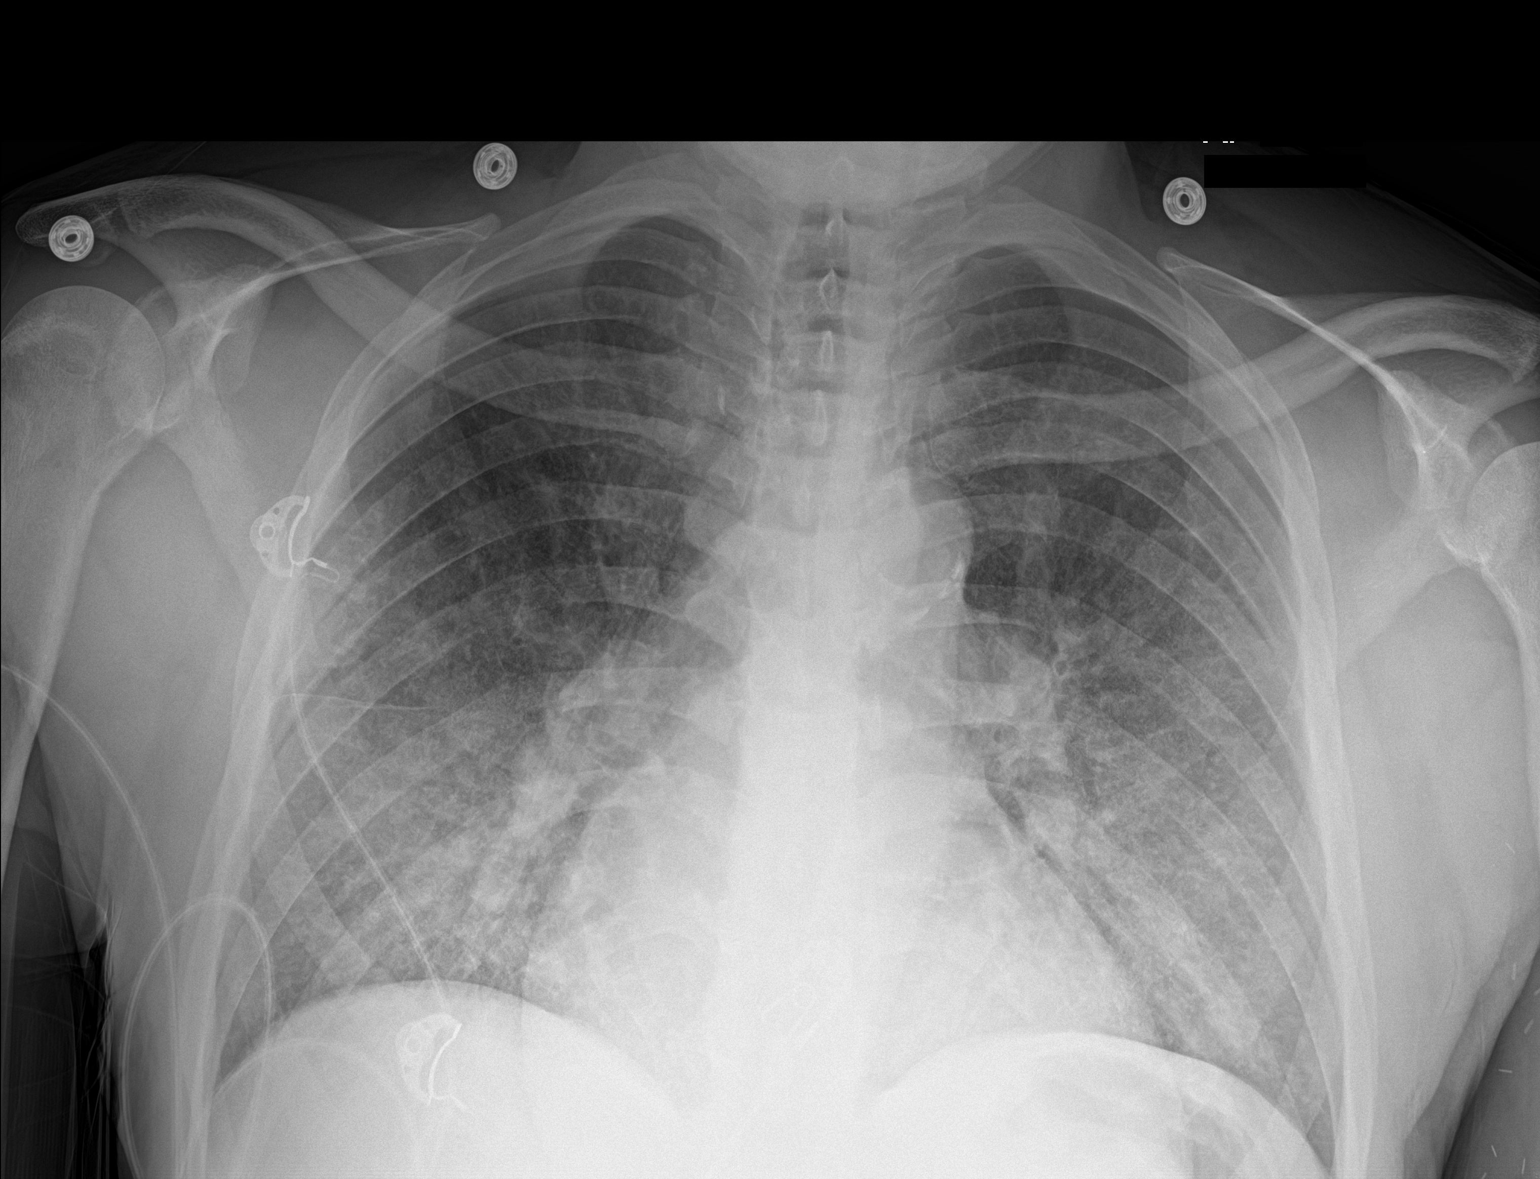

[1 of 1 positions shown; findings below may reference images not displayed]

FINDINGS: Stable mild cardiomegaly. Vascular calcification. Diffuse bilateral
interstitial and ground-glass opacity.aortic atherosclerosis. No
pneumothorax.
IMPRESSION: Cardiomegaly with vascular congestion. Diffuse bilateral
interstitial and ground-glass opacity, favor pulmonary edema.

## 2019-10-05 MED ORDER — DIPHENHYDRAMINE HCL 50 MG/ML IJ SOLN
25.0000 mg | Freq: Once | INTRAMUSCULAR | Status: AC
  Administered 2019-10-05: 25 mg via INTRAVENOUS
  Filled 2019-10-05: qty 1

## 2019-10-05 MED ORDER — ALBUTEROL SULFATE HFA 108 (90 BASE) MCG/ACT IN AERS
2.0000 | INHALATION_SPRAY | Freq: Once | RESPIRATORY_TRACT | Status: AC
  Administered 2019-10-05: 2 via RESPIRATORY_TRACT
  Filled 2019-10-05: qty 6.7

## 2019-10-05 MED ORDER — NICOTINE 14 MG/24HR TD PT24
14.0000 mg | MEDICATED_PATCH | Freq: Every day | TRANSDERMAL | Status: DC
  Administered 2019-10-06 – 2019-10-07 (×3): 14 mg via TRANSDERMAL
  Filled 2019-10-05 (×3): qty 1

## 2019-10-05 MED ORDER — NITROGLYCERIN 2 % TD OINT
1.0000 [in_us] | TOPICAL_OINTMENT | Freq: Four times a day (QID) | TRANSDERMAL | Status: AC
  Administered 2019-10-06: 1 [in_us] via TOPICAL
  Filled 2019-10-05 (×2): qty 30

## 2019-10-05 MED ORDER — FUROSEMIDE 10 MG/ML IJ SOLN
120.0000 mg | Freq: Once | INTRAVENOUS | Status: AC
  Administered 2019-10-05: 120 mg via INTRAVENOUS
  Filled 2019-10-05: qty 10

## 2019-10-05 MED ORDER — METHYLPREDNISOLONE SODIUM SUCC 40 MG IJ SOLR
40.0000 mg | Freq: Once | INTRAMUSCULAR | Status: AC
  Administered 2019-10-05: 40 mg via INTRAVENOUS
  Filled 2019-10-05: qty 1

## 2019-10-05 NOTE — ED Notes (Signed)
Pt given meal per MD 

## 2019-10-05 NOTE — H&P (Signed)
History and Physical    Jordan [name] [id number] DOB: Apr 14, 1966 DOA: 10/05/2019  PCP: Jearld Fenton, NP   Patient coming from: Home.  I have personally briefly reviewed patient's old medical records in [place]  Chief Complaint: Shortness of breath.  HPI: Jordan [name] is a 54 y.o. male with medical history significant of anemia, hep B, ESRD on hemodialysis on M/W/F, dyspnea, ED, EtOH abuse, hypertension, active smoker of about 4 cigarettes a day who is coming to the emergency department due to progressively worse dyspnea for the past few days associated with pink-tinged sputum and fatigue.  His initial O2 sat was 86% on room air.  He denies fever, chills or night sweats.  No history of COVID-19 exposure per patient.  He denies chest pain, palpitations, but occasionally feels lightheaded.  No PND, but some orthopnea and difficulty sleeping, but denies lower extremity edema.  Denies abdominal pain, nausea, emesis, melena or hematochezia.  Had diarrhea about 2 days ago, but has not had a BM since then.  He urinates about once a day and denies dysuria, frequency or hematuria.  No polyuria, polydipsia, polyphagia or blurred vision.  ED Course: White count 9.8, hemoglobin 8.1 and platelets 155.  PT 17.3 INR 1.4.  Lactic acid was normal.  SARS coronavirus was negative.  Potassium was 3.1, chloride 96.  The rest of the electrolytes within normal range when calcium corrected to albumin.  BUN was 71, glucose 124 and creatinine 11.29 mg/dL.  Total bilirubin was 2.5 mg/dL.  Troponin was 134 and then 114 ng/L.  Chest radiograph shows cardiomegaly with vascular congestion.  Review of Systems: As per HPI otherwise 10 point review of systems negative.  Past Medical History:  Diagnosis Date  . Anemia   . CKD (chronic kidney disease)    Stage 5  Dialysis - M/W/F in [place], [place]  . Dyspnea    tx with inhaler when sick  . ED (erectile dysfunction)   . ETOH abuse   . History of blood  transfusion   . Hypertension   . Wears dentures     Past Surgical History:  Procedure Laterality Date  . BASCILIC VEIN TRANSPOSITION Left 08/07/2016   Procedure: LEFT BASILIC VEIN TRANSPOSITION;  Surgeon: Angelia Mould, MD;  Location: [place];  Service: Vascular;  Laterality: Left;  . CLOSED REDUCTION NASAL FRACTURE N/A 08/11/2019   Procedure: CLOSED REDUCTION NASAL FRACTURE WITH STABILIZATION;  Surgeon: Irene Limbo, MD;  Location: [place];  Service: Plastics;  Laterality: N/A;  . COLONOSCOPY N/A 08/12/2016   Procedure: COLONOSCOPY;  Surgeon: Otis Brace, MD;  Location: [place];  Service: Gastroenterology;  Laterality: N/A;  . ESOPHAGOGASTRODUODENOSCOPY N/A 08/12/2016   Procedure: ESOPHAGOGASTRODUODENOSCOPY (EGD);  Surgeon: Otis Brace, MD;  Location: [place];  Service: Gastroenterology;  Laterality: N/A;  . INSERTION OF DIALYSIS CATHETER N/A 08/07/2016   Procedure: INSERTION OF TUNNELED DIALYSIS CATHETER;  Surgeon: Angelia Mould, MD;  Location: [place];  Service: Vascular;  Laterality: N/A;  . LIGATION OF ARTERIOVENOUS  FISTULA Left 09/12/2016   Procedure: BANDING OF LEFT  ARTERIOVENOUS  FISTULA;  Surgeon: Angelia Mould, MD;  Location: [place];  Service: Vascular;  Laterality: Left;  . LOWER EXTREMITY INTERVENTION Right 12/02/2018   Procedure: LOWER EXTREMITY INTERVENTION;  Surgeon: Algernon Huxley, MD;  Location: [place] [facility];  Service: Cardiovascular;  Laterality: Right;     reports that he has been smoking cigarettes. He has a 7.50 pack-year smoking history. He has never  used smokeless tobacco. He reports current alcohol use of about 21.0 standard drinks of alcohol per week. He reports that he does not use drugs.  No Known Allergies  Family History  Problem Relation Age of Onset  . Diabetes Mother   . Kidney failure Mother   . Kidney failure Brother   . Post-traumatic stress disorder Neg Hx   . Bladder Cancer Neg Hx   . Kidney cancer  Neg Hx    Prior to Admission medications   Medication Sig Start Date End Date Taking? Authorizing Provider  albuterol (PROVENTIL HFA;VENTOLIN HFA) 108 (90 Base) MCG/ACT inhaler Inhale 2 puffs into the lungs every 4 (four) hours as needed for wheezing or shortness of breath. 10/03/18   Orpah Greek, MD  calcium acetate (PHOSLO) 667 MG capsule Take 2 capsules (1,334 mg total) by mouth 3 (three) times daily with meals. Patient not taking: Reported on 10/01/2019 02/12/19   Fritzi Mandes, MD  diphenhydrAMINE (BENADRYL) 25 mg capsule Take 1 capsule (25 mg total) by mouth every 6 (six) hours as needed for itching. 09/22/19 10/22/19  Wyvonnia Dusky, MD  hydrOXYzine (ATARAX/VISTARIL) 50 MG tablet Take 50 mg by mouth 3 (three) times daily. 08/09/19   [provider]  sevelamer carbonate (RENVELA) 800 MG tablet Take 2,400 mg by mouth 3 (three) times daily. 09/28/19   [provider]  sildenafil (VIAGRA) 50 MG tablet Take 50 mg by mouth daily as needed for erectile dysfunction. 07/20/19   [provider]    Physical Exam: Vitals:   10/05/19 1617 10/05/19 2049  BP: (!) 172/98 (!) 166/108  Pulse: (!) 111 (!) 111  Resp: (!) 27 (!) 33  SpO2: 91% 93%    Constitutional: NAD, calm, comfortable Eyes: PERRL, lids and conjunctivae normal ENMT: Mucous membranes are moist. Posterior pharynx clear of any exudate or lesions. Neck: normal, supple, no masses, no thyromegaly Respiratory: Bilateral wheezing and crackles. Normal respiratory effort. No accessory muscle use.  Cardiovascular: Regular rate and rhythm, no murmurs / rubs / gallops. No extremity edema.  Left arm AV fistula in place with good thrill.  2+ pedal pulses. No carotid bruits.  Abdomen: no tenderness, no masses palpated. No hepatosplenomegaly. Bowel sounds positive.  Musculoskeletal: no clubbing / cyanosis. Good ROM, no contractures. Normal muscle tone.  Skin: Multiple excoriations on lower extremities from  scratching. Neurologic: CN 2-12 grossly intact. Sensation intact, DTR normal. Strength 5/5 in all 4.  Psychiatric: Normal judgment and insight. Alert and oriented x 3. Normal mood.   Labs on Admission: I have personally reviewed following labs and imaging studies  CBC: Recent Labs  Lab 10/01/19 0429 10/05/19 1653  WBC 9.6 9.8  NEUTROABS 7.7 7.6  HGB 6.8* 8.1*  HCT 21.1* 24.8*  MCV 103.9* 100.8*  PLT 155 [id number]   Basic Metabolic Panel: Recent Labs  Lab 10/01/19 0429 10/05/19 1653  NA 139 139  K 4.2 3.1*  CL 96* 96*  CO2 24 27  GLUCOSE 100* 124*  BUN 54* 71*  CREATININE 12.23* 11.29*  CALCIUM 8.4* 8.4*  MG 2.0  --    GFR: CrCl cannot be calculated (Unknown ideal weight.). Liver Function Tests: Recent Labs  Lab 10/05/19 1653  AST 37  ALT 41  ALKPHOS 155*  BILITOT 2.5*  PROT 6.5  ALBUMIN 3.1*   No results for input(s): LIPASE, AMYLASE in the last 168 hours. No results for input(s): AMMONIA in the last 168 hours. Coagulation Profile: Recent Labs  Lab 10/05/19 1653  INR  1.4*   Cardiac Enzymes: No results for input(s): CKTOTAL, CKMB, CKMBINDEX, TROPONINI in the last 168 hours. BNP (last 3 results) No results for input(s): PROBNP in the last 8760 hours. HbA1C: No results for input(s): HGBA1C in the last 72 hours. CBG: No results for input(s): GLUCAP in the last 168 hours. Lipid Profile: No results for input(s): CHOL, HDL, LDLCALC, TRIG, CHOLHDL, LDLDIRECT in the last 72 hours. Thyroid Function Tests: No results for input(s): TSH, T4TOTAL, FREET4, T3FREE, THYROIDAB in the last 72 hours. Anemia Panel: No results for input(s): VITAMINB12, FOLATE, FERRITIN, TIBC, IRON, RETICCTPCT in the last 72 hours. Urine analysis:    Component Value Date/Time   COLORURINE STRAW (A) 08/04/2016 0448   APPEARANCEUR CLOUDY (A) 08/04/2016 0448   LABSPEC 1.011 08/04/2016 0448   PHURINE 5.5 08/04/2016 0448   GLUCOSEU 100 (A) 08/04/2016 0448   HGBUR SMALL (A) 08/04/2016 0448    BILIRUBINUR NEGATIVE 08/04/2016 0448   KETONESUR NEGATIVE 08/04/2016 0448   PROTEINUR >300 (A) 08/04/2016 0448   NITRITE NEGATIVE 08/04/2016 0448   LEUKOCYTESUR NEGATIVE 08/04/2016 0448    Radiological Exams on Admission: DG Chest Port 1 View  Result Date: 10/05/2019 CLINICAL DATA:  Short of breath EXAM: PORTABLE CHEST 1 VIEW COMPARISON:  10/01/2019, CT 07/22/2019, 07/22/2019, 02/18/2019 FINDINGS: Stable mild cardiomegaly. Vascular calcification. Diffuse bilateral interstitial and ground-glass opacity.aortic atherosclerosis. No pneumothorax. IMPRESSION: Cardiomegaly with vascular congestion. Diffuse bilateral interstitial and ground-glass opacity, favor pulmonary edema. Electronically Signed   By: Donavan Foil M.D.   On: 10/05/2019 16:38    EKG: Independently reviewed.  Vent. rate 109 BPM PR interval * ms QRS duration 89 ms QT/QTc 376/507 ms P-R-T axes 66 62 75 Sinus tachycardia  Assessment/Plan Principal Problem:   Volume overload Likely due to incomplete dialysis. Furosemide 120 mg IVP x1. Nitropaste to anterior chest wall. HD in the morning. Solu-Medrol 40 mg IVP x1. Bronchodilators as needed.  Active Problems:   Essential hypertension Monitor blood pressure. Hydralazine as needed. Received Lasix earlier. Has Nitropaste to ACW    Anemia in ESRD (end-stage renal disease) ([facility]) Monitor H&H. Epogen per nephrology.    Pruritus Continue hydroxyzine. Received Solu-Medrol for wheezing.    Hyperbilirubinemia Check RUQ ultrasound. Follow-up LFTs.    Hypokalemia Monitor potassium level.   DVT prophylaxis: Lovenox SQ. Code Status: Full code. Family Communication:  Disposition Plan: Observation for HD in AM. Consults called: Nephrology was contacted by the ED. Admission status: Observation/telemetry.   Reubin Milan MD [facility]  If 7PM-7AM, please contact night-coverage [email or web address]  10/05/2019, 9:39 PM   This document was prepared using  Dragon voice recognition software and may contain some unintended transcription errors.

## 2019-10-05 NOTE — ED Triage Notes (Signed)
BIB EMS from home. Reports SOB X few days, coughing up pink tinged sputum, seen 10/01/19 and dx with pulmonary edema. Unable to complete a full dialysis treatment in 3 weeks. 86% RA, placed on 2L [place].

## 2019-10-05 NOTE — ED Provider Notes (Signed)
[facility] EMERGENCY DEPARTMENT Provider Note   CSN: [id number] Arrival date & time: 10/05/19  1551     History Chief Complaint  Patient presents with  . Shortness of Breath    Jordan [name] is a 54 y.o. male.  HPI Patient reports that he started to get increasingly short of breath this evening.  He reports he has been somewhat short of breath for a few days but started coughing a lot last night and today.  He reports he is bringing up a little bit of pink-tinged sputum.  He also reports he is wheezing.  Patient reports he does smoke a few cigarettes a day.  He has not had fever or body aches.  He denies chest pain.  He denies swelling in his legs.  He reports he is having to cut his dialysis sessions short because he has such a bad problem with itching.  He reports almost his whole body itches, and after about an hour he cannot finish the dialysis.  He states his itching started a few weeks ago after he got his last blood transfusion.  He denies he had a problem with distracting itching before that.     Past Medical History:  Diagnosis Date  . Anemia   . CKD (chronic kidney disease)    Stage 5  Dialysis - M/W/F in [place], [place]  . Dyspnea    tx with inhaler when sick  . ED (erectile dysfunction)   . ETOH abuse   . History of blood transfusion   . Hypertension   . Wears dentures     Patient Active Problem List   Diagnosis Date Noted  . ESRD (end stage renal disease) ([facility]) 10/01/2019  . Leg mass, right   . Hyperbilirubinemia   . Prolonged QT interval 09/19/2019  . Symptomatic anemia 09/19/2019  . Pruritus 09/19/2019  . Anemia in ESRD (end-stage renal disease) ([facility]) 05/17/2018  . Hepatitis B 03/19/2018  . ESRD on dialysis ([facility]) 05/26/2017  . Essential hypertension 05/26/2017    Past Surgical History:  Procedure Laterality Date  . BASCILIC VEIN TRANSPOSITION Left 08/07/2016   Procedure: LEFT BASILIC VEIN TRANSPOSITION;  Surgeon: Angelia Mould, MD;  Location: [facility];  Service: Vascular;  Laterality: Left;  . CLOSED REDUCTION NASAL FRACTURE N/A 08/11/2019   Procedure: CLOSED REDUCTION NASAL FRACTURE WITH STABILIZATION;  Surgeon: Irene Limbo, MD;  Location: [facility];  Service: Plastics;  Laterality: N/A;  . COLONOSCOPY N/A 08/12/2016   Procedure: COLONOSCOPY;  Surgeon: Otis Brace, MD;  Location: [facility];  Service: Gastroenterology;  Laterality: N/A;  . ESOPHAGOGASTRODUODENOSCOPY N/A 08/12/2016   Procedure: ESOPHAGOGASTRODUODENOSCOPY (EGD);  Surgeon: Otis Brace, MD;  Location: [facility];  Service: Gastroenterology;  Laterality: N/A;  . INSERTION OF DIALYSIS CATHETER N/A 08/07/2016   Procedure: INSERTION OF TUNNELED DIALYSIS CATHETER;  Surgeon: Angelia Mould, MD;  Location: [facility];  Service: Vascular;  Laterality: N/A;  . LIGATION OF ARTERIOVENOUS  FISTULA Left 09/12/2016   Procedure: BANDING OF LEFT  ARTERIOVENOUS  FISTULA;  Surgeon: Angelia Mould, MD;  Location: [facility];  Service: Vascular;  Laterality: Left;  . LOWER EXTREMITY INTERVENTION Right 12/02/2018   Procedure: LOWER EXTREMITY INTERVENTION;  Surgeon: Algernon Huxley, MD;  Location: [facility];  Service: Cardiovascular;  Laterality: Right;       Family History  Problem Relation Age of Onset  . Diabetes Mother   . Kidney failure Mother   . Kidney failure Brother   .  Post-traumatic stress disorder Neg Hx   . Bladder Cancer Neg Hx   . Kidney cancer Neg Hx     Social History   Tobacco Use  . Smoking status: Current Every Day Smoker    Packs/day: 0.50    Years: 15.00    Pack years: 7.50    Types: Cigarettes  . Smokeless tobacco: Never Used  Substance Use Topics  . Alcohol use: Yes    Alcohol/week: 21.0 standard drinks    Types: 21 Cans of beer per week    Comment: none since 08/04/16  . Drug use: No    Home Medications Prior to Admission medications   Medication Sig Start Date End Date Taking? Authorizing  Provider  albuterol (PROVENTIL HFA;VENTOLIN HFA) 108 (90 Base) MCG/ACT inhaler Inhale 2 puffs into the lungs every 4 (four) hours as needed for wheezing or shortness of breath. 10/03/18   Orpah Greek, MD  calcium acetate (PHOSLO) 667 MG capsule Take 2 capsules (1,334 mg total) by mouth 3 (three) times daily with meals. Patient not taking: Reported on 10/01/2019 02/12/19   Fritzi Mandes, MD  diphenhydrAMINE (BENADRYL) 25 mg capsule Take 1 capsule (25 mg total) by mouth every 6 (six) hours as needed for itching. 09/22/19 10/22/19  Wyvonnia Dusky, MD  hydrOXYzine (ATARAX/VISTARIL) 50 MG tablet Take 50 mg by mouth 3 (three) times daily. 08/09/19   [provider]  sevelamer carbonate (RENVELA) 800 MG tablet Take 2,400 mg by mouth 3 (three) times daily. 09/28/19   [provider]  sildenafil (VIAGRA) 50 MG tablet Take 50 mg by mouth daily as needed for erectile dysfunction. 07/20/19   [provider]    Allergies    Patient has no known allergies.  Review of Systems   Review of Systems 10 Systems reviewed and are negative for acute change except as noted in the HPI.  Physical Exam Updated Vital Signs BP (!) 166/108 (BP Location: Right Arm)   Pulse (!) 111   Resp (!) 33   SpO2 93%   Physical Exam Constitutional:      Comments: Patient is alert and appropriate.  Mild to moderate tachypnea.  He is speaking in full sentences.  Mental status is clear.  HENT:     Head: Normocephalic and atraumatic.     Mouth/Throat:     Pharynx: Oropharynx is clear.  Eyes:     Extraocular Movements: Extraocular movements intact.  Cardiovascular:     Rate and Rhythm: Normal rate and regular rhythm.  Pulmonary:     Comments: Moderate increased work of breathing at rest.  Speaking in full sentences.  Crackles lower lung fields. Abdominal:     General: There is no distension.     Palpations: Abdomen is soft.     Tenderness: There is no abdominal tenderness. There is no  guarding.  Musculoskeletal:        General: No swelling. Normal range of motion.     Right lower leg: No edema.     Left lower leg: No edema.  Skin:    General: Skin is warm and dry.  Neurological:     General: No focal deficit present.     Coordination: Coordination normal.  Psychiatric:        Mood and Affect: Mood normal.     ED Results / Procedures / Treatments   Labs (all labs ordered are listed, but only abnormal results are displayed) Labs Reviewed  COMPREHENSIVE METABOLIC PANEL - Abnormal; Notable for the following  components:      Result Value   Potassium 3.1 (*)    Chloride 96 (*)    Glucose, Bld 124 (*)    BUN 71 (*)    Creatinine, Ser 11.29 (*)    Calcium 8.4 (*)    Albumin 3.1 (*)    Alkaline Phosphatase 155 (*)    Total Bilirubin 2.5 (*)    GFR calc non Af Amer 5 (*)    GFR calc Af Amer 5 (*)    Anion gap 16 (*)    All other components within normal limits  CBC WITH DIFFERENTIAL/PLATELET - Abnormal; Notable for the following components:   RBC 2.46 (*)    Hemoglobin 8.1 (*)    HCT 24.8 (*)    MCV 100.8 (*)    RDW 15.7 (*)    Abs Immature Granulocytes 0.19 (*)    All other components within normal limits  PROTIME-INR - Abnormal; Notable for the following components:   Prothrombin Time 17.3 (*)    INR 1.4 (*)    All other components within normal limits  TROPONIN I (HIGH SENSITIVITY) - Abnormal; Notable for the following components:   Troponin I (High Sensitivity) 134 (*)    All other components within normal limits  RESPIRATORY PANEL BY RT PCR (FLU A&B, COVID)  LACTIC ACID, PLASMA  POC SARS CORONAVIRUS 2 AG -  ED  TROPONIN I (HIGH SENSITIVITY)    EKG EKG Interpretation  Date/Time:  Wednesday October 05 2019 16:14:24 EST Ventricular Rate:  109 PR Interval:    QRS Duration: 89 QT Interval:  376 QTC Calculation: 507 R Axis:   62 Text Interpretation: Sinus tachycardia no sig change from previous. no acute ischemic appearamnce Confirmed by  Charlesetta Shanks [phone]) on 10/05/2019 8:29:03 PM   Radiology DG Chest Port 1 View  Result Date: 10/05/2019 CLINICAL DATA:  Short of breath EXAM: PORTABLE CHEST 1 VIEW COMPARISON:  10/01/2019, CT 07/22/2019, 07/22/2019, 02/18/2019 FINDINGS: Stable mild cardiomegaly. Vascular calcification. Diffuse bilateral interstitial and ground-glass opacity.aortic atherosclerosis. No pneumothorax. IMPRESSION: Cardiomegaly with vascular congestion. Diffuse bilateral interstitial and ground-glass opacity, favor pulmonary edema. Electronically Signed   By: Donavan Foil M.D.   On: 10/05/2019 16:38    Procedures Procedures (including critical care time) CRITICAL CARE Performed by: Charlesetta Shanks   Total critical care time: 30 minutes  Critical care time was exclusive of separately billable procedures and treating other patients.  Critical care was necessary to treat or prevent imminent or life-threatening deterioration.  Critical care was time spent personally by me on the following activities: development of treatment plan with patient and/or surrogate as well as nursing, discussions with consultants, evaluation of patient's response to treatment, examination of patient, obtaining history from patient or surrogate, ordering and performing treatments and interventions, ordering and review of laboratory studies, ordering and review of radiographic studies, pulse oximetry and re-evaluation of patient's condition. Medications Ordered in ED Medications  albuterol (VENTOLIN HFA) 108 (90 Base) MCG/ACT inhaler 2 puff (has no administration in time range)  diphenhydrAMINE (BENADRYL) injection 25 mg (has no administration in time range)  albuterol (VENTOLIN HFA) 108 (90 Base) MCG/ACT inhaler 2 puff (2 puffs Inhalation Given 10/05/19 1859)    ED Course  I have reviewed the triage vital signs and the nursing notes.  Pertinent labs & imaging results that were available during my care of the patient were reviewed by  me and considered in my medical decision making (see chart for details).  Clinical Course as of  Oct 04 2101  Wed Oct 05, 2019  2039 Consult: Reviewed with nephrology, Dr. Carolin Sicks. They will consult and see patient in the morning anticipating needing inpatient dialysis.   [MP]    Clinical Course User Index [MP] Charlesetta Shanks, MD   MDM Rules/Calculators/A&P                      Patient presents with increasing shortness of breath.  He has not been able to complete dialysis sessions.  He does have crackles and volume overload on x-ray.  This appears most likely due to inability to complete dialysis sessions.  Patient is requiring oxygen.  He will need admission for observation and inpatient dialysis. Final Clinical Impression(s) / ED Diagnoses Final diagnoses:  ESRD (end stage renal disease) on dialysis [facility])  Hypoxia    Rx / DC Orders ED Discharge Orders    None       Charlesetta Shanks, MD 10/07/19 1607

## 2019-10-06 DIAGNOSIS — E8770 Fluid overload, unspecified: Secondary | ICD-10-CM | POA: Diagnosis present

## 2019-10-06 DIAGNOSIS — I959 Hypotension, unspecified: Secondary | ICD-10-CM | POA: Diagnosis present

## 2019-10-06 DIAGNOSIS — R0603 Acute respiratory distress: Secondary | ICD-10-CM | POA: Diagnosis present

## 2019-10-06 DIAGNOSIS — F17210 Nicotine dependence, cigarettes, uncomplicated: Secondary | ICD-10-CM | POA: Diagnosis present

## 2019-10-06 DIAGNOSIS — Z9115 Patient's noncompliance with renal dialysis: Secondary | ICD-10-CM | POA: Diagnosis not present

## 2019-10-06 DIAGNOSIS — E8889 Other specified metabolic disorders: Secondary | ICD-10-CM | POA: Diagnosis present

## 2019-10-06 DIAGNOSIS — D631 Anemia in chronic kidney disease: Secondary | ICD-10-CM

## 2019-10-06 DIAGNOSIS — Z20822 Contact with and (suspected) exposure to covid-19: Secondary | ICD-10-CM | POA: Diagnosis present

## 2019-10-06 DIAGNOSIS — R17 Unspecified jaundice: Secondary | ICD-10-CM | POA: Diagnosis present

## 2019-10-06 DIAGNOSIS — N186 End stage renal disease: Secondary | ICD-10-CM | POA: Diagnosis present

## 2019-10-06 DIAGNOSIS — E876 Hypokalemia: Secondary | ICD-10-CM | POA: Diagnosis present

## 2019-10-06 DIAGNOSIS — Z992 Dependence on renal dialysis: Secondary | ICD-10-CM | POA: Diagnosis not present

## 2019-10-06 DIAGNOSIS — L299 Pruritus, unspecified: Secondary | ICD-10-CM | POA: Diagnosis present

## 2019-10-06 DIAGNOSIS — R0902 Hypoxemia: Secondary | ICD-10-CM | POA: Diagnosis present

## 2019-10-06 DIAGNOSIS — Z841 Family history of disorders of kidney and ureter: Secondary | ICD-10-CM | POA: Diagnosis not present

## 2019-10-06 DIAGNOSIS — Z833 Family history of diabetes mellitus: Secondary | ICD-10-CM | POA: Diagnosis not present

## 2019-10-06 DIAGNOSIS — I120 Hypertensive chronic kidney disease with stage 5 chronic kidney disease or end stage renal disease: Secondary | ICD-10-CM | POA: Diagnosis present

## 2019-10-06 DIAGNOSIS — B1910 Unspecified viral hepatitis B without hepatic coma: Secondary | ICD-10-CM | POA: Diagnosis present

## 2019-10-06 DIAGNOSIS — J810 Acute pulmonary edema: Secondary | ICD-10-CM | POA: Diagnosis present

## 2019-10-06 LAB — COMPREHENSIVE METABOLIC PANEL
ALT: 42 U/L (ref 0–44)
AST: 32 U/L (ref 15–41)
Albumin: 2.9 g/dL — ABNORMAL LOW (ref 3.5–5.0)
Alkaline Phosphatase: 136 U/L — ABNORMAL HIGH (ref 38–126)
Anion gap: 18 — ABNORMAL HIGH (ref 5–15)
BUN: 84 mg/dL — ABNORMAL HIGH (ref 6–20)
CO2: 24 mmol/L (ref 22–32)
Calcium: 8.2 mg/dL — ABNORMAL LOW (ref 8.9–10.3)
Chloride: 97 mmol/L — ABNORMAL LOW (ref 98–111)
Creatinine, Ser: 12.5 mg/dL — ABNORMAL HIGH (ref 0.61–1.24)
GFR calc Af Amer: 5 mL/min — ABNORMAL LOW (ref >60)
GFR calc non Af Amer: 4 mL/min — ABNORMAL LOW (ref >60)
Glucose, Bld: 177 mg/dL — ABNORMAL HIGH (ref 70–99)
Potassium: 4.2 mmol/L (ref 3.5–5.1)
Sodium: 139 mmol/L (ref 135–145)
Total Bilirubin: 2.2 mg/dL — ABNORMAL HIGH (ref 0.3–1.2)
Total Protein: 6.3 g/dL — ABNORMAL LOW (ref 6.5–8.1)

## 2019-10-06 LAB — CBC
HCT: 22.6 % — ABNORMAL LOW (ref 39.0–52.0)
Hemoglobin: 7.4 g/dL — ABNORMAL LOW (ref 13.0–17.0)
MCH: 32.9 pg (ref 26.0–34.0)
MCHC: 32.7 g/dL (ref 30.0–36.0)
MCV: 100.4 fL — ABNORMAL HIGH (ref 80.0–100.0)
Platelets: 148 10*3/uL — ABNORMAL LOW (ref 150–400)
RBC: 2.25 MIL/uL — ABNORMAL LOW (ref 4.22–5.81)
RDW: 15.7 % — ABNORMAL HIGH (ref 11.5–15.5)
WBC: 9.1 10*3/uL (ref 4.0–10.5)
nRBC: 0 % (ref 0.0–0.2)

## 2019-10-06 LAB — PHOSPHORUS: Phosphorus: 4.9 mg/dL — ABNORMAL HIGH (ref 2.5–4.6)

## 2019-10-06 MED ORDER — CAMPHOR-MENTHOL 0.5-0.5 % EX LOTN
TOPICAL_LOTION | CUTANEOUS | Status: DC | PRN
  Filled 2019-10-06: qty 222

## 2019-10-06 MED ORDER — HYDRALAZINE HCL 10 MG PO TABS
10.0000 mg | ORAL_TABLET | Freq: Four times a day (QID) | ORAL | Status: DC | PRN
  Administered 2019-10-06: 10 mg via ORAL
  Filled 2019-10-06: qty 1

## 2019-10-06 MED ORDER — HYDROXYZINE HCL 25 MG PO TABS
50.0000 mg | ORAL_TABLET | Freq: Three times a day (TID) | ORAL | Status: DC
  Administered 2019-10-06 – 2019-10-07 (×5): 50 mg via ORAL
  Filled 2019-10-06 (×6): qty 2

## 2019-10-06 MED ORDER — SEVELAMER CARBONATE 800 MG PO TABS
2400.0000 mg | ORAL_TABLET | Freq: Three times a day (TID) | ORAL | Status: DC
  Administered 2019-10-06 – 2019-10-07 (×3): 2400 mg via ORAL
  Filled 2019-10-06 (×3): qty 3

## 2019-10-06 MED ORDER — ACETAMINOPHEN 650 MG RE SUPP: 650.0000 mg | Freq: Four times a day (QID) | RECTAL | Status: DC | PRN

## 2019-10-06 MED ORDER — CALCIUM ACETATE (PHOS BINDER) 667 MG PO CAPS
1334.0000 mg | ORAL_CAPSULE | Freq: Three times a day (TID) | ORAL | Status: DC
  Administered 2019-10-06 – 2019-10-07 (×3): 1334 mg via ORAL
  Filled 2019-10-06 (×3): qty 2

## 2019-10-06 MED ORDER — GABAPENTIN 100 MG PO CAPS
100.0000 mg | ORAL_CAPSULE | Freq: Every day | ORAL | Status: DC
  Administered 2019-10-06 (×2): 100 mg via ORAL
  Filled 2019-10-06 (×2): qty 1

## 2019-10-06 MED ORDER — ALBUTEROL SULFATE (2.5 MG/3ML) 0.083% IN NEBU: 3.0000 mL | INHALATION_SOLUTION | RESPIRATORY_TRACT | Status: DC | PRN

## 2019-10-06 MED ORDER — CHLORHEXIDINE GLUCONATE CLOTH 2 % EX PADS: 6.0000 | MEDICATED_PAD | Freq: Every day | CUTANEOUS | Status: DC

## 2019-10-06 MED ORDER — HEPARIN SODIUM (PORCINE) 5000 UNIT/ML IJ SOLN
5000.0000 [IU] | Freq: Three times a day (TID) | INTRAMUSCULAR | Status: DC
  Administered 2019-10-06: 5000 [IU] via SUBCUTANEOUS
  Filled 2019-10-06 (×2): qty 1

## 2019-10-06 MED ORDER — ACETAMINOPHEN 325 MG PO TABS
650.0000 mg | ORAL_TABLET | Freq: Four times a day (QID) | ORAL | Status: DC | PRN
  Filled 2019-10-06: qty 2

## 2019-10-06 NOTE — Plan of Care (Signed)
  Problem: Activity: Goal: Risk for activity intolerance will decrease Outcome: Progressing   Problem: Coping: Goal: Level of anxiety will decrease Outcome: Progressing   

## 2019-10-06 NOTE — Consult Note (Signed)
[facility]: Reason for Consult: To manage dialysis and dialysis related needs Referring Physician: Dr Eulogio Bear  HPI:  Jordan [name] is an 54 y.o. male with history of hypertension, anemia, ESRD on HD MWF at [facility], noncompliance with the dialysis treatment presented with worsening shortness of breath, hypoxia seen as a consultation at the request of Dr. Eliseo Squires for the management of ESRD.  Patient is noncompliance with outpatient dialysis treatment.  He missed 2 treatments last week, the last treatment was yesterday only for 1 hour.  He signs off early with the complaint of itching.  His phosphorus level was very high.  In the ER, he was hypotensive, hypoxic in room air requiring 3 L of oxygen.  The chest x-ray with pulmonary edema.  Labs showed potassium level of 4.2 and hemoglobin 7.4.  Patient reports worsening shortness of breath associated with some chest discomfort.  Denied nausea vomiting headache or dizziness.  Outpatient dialysis orders: Dialyzes at [place] 68 kg. 4hours HD Bath 2K, 2 Ca , Dialyzer 180 NR, Heparin none. Access left UE AVF + Mircera 225 mcg on 1/6  Past Medical History:  Diagnosis Date  . Anemia   . CKD (chronic kidney disease)    Stage 5  Dialysis - M/W/F in [place], [place]  . Dyspnea    tx with inhaler when sick  . ED (erectile dysfunction)   . ETOH abuse   . History of blood transfusion   . Hypertension   . Wears dentures     Past Surgical History:  Procedure Laterality Date  . BASCILIC VEIN TRANSPOSITION Left 08/07/2016   Procedure: LEFT BASILIC VEIN TRANSPOSITION;  Surgeon: Angelia Mould, MD;  Location: [place];  Service: Vascular;  Laterality: Left;  . CLOSED REDUCTION NASAL FRACTURE N/A 08/11/2019   Procedure: CLOSED REDUCTION NASAL FRACTURE WITH STABILIZATION;  Surgeon: Irene Limbo, MD;  Location: [place];  Service: Plastics;  Laterality: N/A;  . COLONOSCOPY N/A 08/12/2016   Procedure: COLONOSCOPY;  Surgeon: Otis Brace, MD;  Location: [place];  Service: Gastroenterology;  Laterality: N/A;  . ESOPHAGOGASTRODUODENOSCOPY N/A 08/12/2016   Procedure: ESOPHAGOGASTRODUODENOSCOPY (EGD);  Surgeon: Otis Brace, MD;  Location: [place];  Service: Gastroenterology;  Laterality: N/A;  . INSERTION OF DIALYSIS CATHETER N/A 08/07/2016   Procedure: INSERTION OF TUNNELED DIALYSIS CATHETER;  Surgeon: Angelia Mould, MD;  Location: [place];  Service: Vascular;  Laterality: N/A;  . LIGATION OF ARTERIOVENOUS  FISTULA Left 09/12/2016   Procedure: BANDING OF LEFT  ARTERIOVENOUS  FISTULA;  Surgeon: Angelia Mould, MD;  Location: [place];  Service: Vascular;  Laterality: Left;  . LOWER EXTREMITY INTERVENTION Right 12/02/2018   Procedure: LOWER EXTREMITY INTERVENTION;  Surgeon: Algernon Huxley, MD;  Location: [facility];  Service: Cardiovascular;  Laterality: Right;    Family History  Problem Relation Age of Onset  . Diabetes Mother   . Kidney failure Mother   . Kidney failure Brother   . Post-traumatic stress disorder Neg Hx   . Bladder Cancer Neg Hx   . Kidney cancer Neg Hx     Social History:  reports that he has been smoking cigarettes. He has a 7.50 pack-year smoking history. He has never used smokeless tobacco. He reports current alcohol use of about 21.0 standard drinks of alcohol per week. He reports that he does not use drugs.  Allergies: No Known Allergies  Medications: I have reviewed the patient's current medications.   Results for orders  placed or performed during the hospital encounter of 10/05/19 (from the past 48 hour(s))  Comprehensive metabolic panel     Status: Abnormal   Collection Time: 10/05/19  4:53 PM  Result Value Ref Range   Sodium 139 135 - 145 mmol/L   Potassium 3.1 (L) 3.5 - 5.1 mmol/L   Chloride 96 (L) 98 - 111 mmol/L   CO2 27 22 - 32 mmol/L   Glucose, Bld 124 (H) 70 - 99 mg/dL   BUN 71 (H) 6 - 20 mg/dL    Creatinine, Ser 11.29 (H) 0.61 - 1.24 mg/dL   Calcium 8.4 (L) 8.9 - 10.3 mg/dL   Total Protein 6.5 6.5 - 8.1 g/dL   Albumin 3.1 (L) 3.5 - 5.0 g/dL   AST 37 15 - 41 U/L   ALT 41 0 - 44 U/L   Alkaline Phosphatase 155 (H) 38 - 126 U/L   Total Bilirubin 2.5 (H) 0.3 - 1.2 mg/dL   GFR calc non Af Amer 5 (L) >60 mL/min   GFR calc Af Amer 5 (L) >60 mL/min   Anion gap 16 (H) 5 - 15    Comment: Performed at [facility], [address], [place], [place] [address]  Troponin I (High Sensitivity)     Status: Abnormal   Collection Time: 10/05/19  4:53 PM  Result Value Ref Range   Troponin I (High Sensitivity) 134 (HH) <18 ng/L    Comment: CRITICAL RESULT CALLED TO, READ BACK BY AND VERIFIED WITH: R HARDY,RN 1756 10/05/2019 WBOND (NOTE) Elevated high sensitivity troponin I (hsTnI) values and significant  changes across serial measurements may suggest ACS but many other  chronic and acute conditions are known to elevate hsTnI results.  Refer to the Links section for chest pain algorithms and additional  guidance. Performed at [facility], [place] [address] [place], [place] [address]   Lactic acid, plasma     Status: None   Collection Time: 10/05/19  4:53 PM  Result Value Ref Range   Lactic Acid, Venous 1.8 0.5 - 1.9 mmol/L    Comment: Performed at [facility] [address] [place], [place] [address]  CBC with Differential     Status: Abnormal   Collection Time: 10/05/19  4:53 PM  Result Value Ref Range   WBC 9.8 4.0 - 10.5 K/uL   RBC 2.46 (L) 4.22 - 5.81 MIL/uL   Hemoglobin 8.1 (L) 13.0 - 17.0 g/dL   HCT 24.8 (L) 39.0 - 52.0 %   MCV 100.8 (H) 80.0 - 100.0 fL   MCH 32.9 26.0 - 34.0 pg   MCHC 32.7 30.0 - 36.0 g/dL   RDW 15.7 (H) 11.5 - 15.5 %   Platelets 155 150 - 400 K/uL   nRBC 0.2 0.0 - 0.2 %   Neutrophils Relative % 76 %   Neutro Abs 7.6 1.7 - 7.7 K/uL   Lymphocytes Relative 12 %   Lymphs Abs 1.1 0.7 - 4.0 K/uL   Monocytes Relative 6 %   Monocytes Absolute 0.6  0.1 - 1.0 K/uL   Eosinophils Relative 4 %   Eosinophils Absolute 0.3 0.0 - 0.5 K/uL   Basophils Relative 0 %   Basophils Absolute 0.0 0.0 - 0.1 K/uL   Immature Granulocytes 2 %   Abs Immature Granulocytes 0.19 (H) 0.00 - 0.07 K/uL    Comment: Performed at [facility] [address] [place], [place] [address]  Protime-INR     Status:  Abnormal   Collection Time: 10/05/19  4:53 PM  Result Value Ref Range   Prothrombin Time 17.3 (H) 11.4 - 15.2 seconds   INR 1.4 (H) 0.8 - 1.2    Comment: (NOTE) INR goal varies based on device and disease states. Performed at [facility], [place] [address] [place], [place] [address]   POC SARS Coronavirus 2 Ag-ED - Nasal Swab (BD Veritor Kit)     Status: None   Collection Time: 10/05/19  5:32 PM  Result Value Ref Range   SARS Coronavirus 2 Ag NEGATIVE NEGATIVE    Comment: (NOTE) SARS-CoV-2 antigen NOT DETECTED.  Negative results are presumptive.  Negative results do not preclude SARS-CoV-2 infection and should not be used as the sole basis for treatment or other patient management decisions, including infection  control decisions, particularly in the presence of clinical signs and  symptoms consistent with COVID-19, or in those who have been in contact with the virus.  Negative results must be combined with clinical observations, patient history, and epidemiological information. The expected result is Negative. Fact Sheet for Patients: PodPark.tn Fact Sheet for Healthcare Providers: GiftContent.is This test is not yet approved or cleared by the [place] FDA and  has been authorized for detection and/or diagnosis of SARS-CoV-2 by FDA under an Emergency Use Authorization (EUA).  This EUA will remain in effect (meaning this test can be used) for the duration of  the COVID-19 de claration under Section 564(b)(1) of the Act, 21 U.S.C. section 360bbb-3(b)(1), unless the  authorization is terminated or revoked sooner.   Troponin I (High Sensitivity)     Status: Abnormal   Collection Time: 10/05/19  8:37 PM  Result Value Ref Range   Troponin I (High Sensitivity) 114 (HH) <18 ng/L    Comment: CRITICAL VALUE NOTED.  VALUE IS CONSISTENT WITH PREVIOUSLY REPORTED AND CALLED VALUE. Performed at [facility], [place] [address], [place], [place] [address]   Respiratory Panel by RT PCR (Flu A&B, Covid) - Nasopharyngeal Swab     Status: None   Collection Time: 10/05/19 10:22 PM   Specimen: Nasopharyngeal Swab  Result Value Ref Range   SARS Coronavirus 2 by RT PCR NEGATIVE NEGATIVE    Comment: (NOTE) SARS-CoV-2 target nucleic acids are NOT DETECTED. The SARS-CoV-2 RNA is generally detectable in upper respiratoy specimens during the acute phase of infection. The lowest concentration of SARS-CoV-2 viral copies this assay can detect is 131 copies/mL. A negative result does not preclude SARS-Cov-2 infection and should not be used as the sole basis for treatment or other patient management decisions. A negative result may occur with  improper specimen collection/handling, submission of specimen other than nasopharyngeal swab, presence of viral mutation(s) within the areas targeted by this assay, and inadequate number of viral copies (<131 copies/mL). A negative result must be combined with clinical observations, patient history, and epidemiological information. The expected result is Negative. Fact Sheet for Patients:  PinkCheek.be Fact Sheet for Healthcare Providers:  GravelBags.it This test is not yet ap proved or cleared by the [place] FDA and  has been authorized for detection and/or diagnosis of SARS-CoV-2 by FDA under an Emergency Use Authorization (EUA). This EUA will remain  in effect (meaning this test can be used) for the duration of the COVID-19 declaration under Section 564(b)(1) of the  Act, 21 U.S.C. section 360bbb-3(b)(1), unless the authorization is terminated or revoked sooner.    Influenza A by PCR NEGATIVE NEGATIVE   Influenza B by  PCR NEGATIVE NEGATIVE    Comment: (NOTE) The Xpert Xpress SARS-CoV-2/FLU/RSV assay is intended as an aid in  the diagnosis of influenza from Nasopharyngeal swab specimens and  should not be used as a sole basis for treatment. Nasal washings and  aspirates are unacceptable for Xpert Xpress SARS-CoV-2/FLU/RSV  testing. Fact Sheet for Patients: PinkCheek.be Fact Sheet for Healthcare Providers: GravelBags.it This test is not yet approved or cleared by the [place] FDA and  has been authorized for detection and/or diagnosis of SARS-CoV-2 by  FDA under an Emergency Use Authorization (EUA). This EUA will remain  in effect (meaning this test can be used) for the duration of the  Covid-19 declaration under Section 564(b)(1) of the Act, 21  U.S.C. section 360bbb-3(b)(1), unless the authorization is  terminated or revoked. Performed at [facility], [place] [address] [place], [place] [address]   Comprehensive metabolic panel     Status: Abnormal   Collection Time: 10/06/19  4:28 AM  Result Value Ref Range   Sodium 139 135 - 145 mmol/L   Potassium 4.2 3.5 - 5.1 mmol/L   Chloride 97 (L) 98 - 111 mmol/L   CO2 24 22 - 32 mmol/L   Glucose, Bld 177 (H) 70 - 99 mg/dL   BUN 84 (H) 6 - 20 mg/dL   Creatinine, Ser 12.50 (H) 0.61 - 1.24 mg/dL   Calcium 8.2 (L) 8.9 - 10.3 mg/dL   Total Protein 6.3 (L) 6.5 - 8.1 g/dL   Albumin 2.9 (L) 3.5 - 5.0 g/dL   AST 32 15 - 41 U/L   ALT 42 0 - 44 U/L   Alkaline Phosphatase 136 (H) 38 - 126 U/L   Total Bilirubin 2.2 (H) 0.3 - 1.2 mg/dL   GFR calc non Af Amer 4 (L) >60 mL/min   GFR calc Af Amer 5 (L) >60 mL/min   Anion gap 18 (H) 5 - 15    Comment: Performed at [facility], [place] [address] [place], [place] [address]  CBC     Status:  Abnormal   Collection Time: 10/06/19  4:28 AM  Result Value Ref Range   WBC 9.1 4.0 - 10.5 K/uL   RBC 2.25 (L) 4.22 - 5.81 MIL/uL   Hemoglobin 7.4 (L) 13.0 - 17.0 g/dL   HCT 22.6 (L) 39.0 - 52.0 %   MCV 100.4 (H) 80.0 - 100.0 fL   MCH 32.9 26.0 - 34.0 pg   MCHC 32.7 30.0 - 36.0 g/dL   RDW 15.7 (H) 11.5 - 15.5 %   Platelets 148 (L) 150 - 400 K/uL   nRBC 0.0 0.0 - 0.2 %    Comment: Performed at [facility], [place] [address], [place], [place] [address]    DG Chest Port 1 View  Result Date: 10/05/2019 CLINICAL DATA:  Short of breath EXAM: PORTABLE CHEST 1 VIEW COMPARISON:  10/01/2019, CT 07/22/2019, 07/22/2019, 02/18/2019 FINDINGS: Stable mild cardiomegaly. Vascular calcification. Diffuse bilateral interstitial and ground-glass opacity.aortic atherosclerosis. No pneumothorax. IMPRESSION: Cardiomegaly with vascular congestion. Diffuse bilateral interstitial and ground-glass opacity, favor pulmonary edema. Electronically Signed   By: Donavan Foil M.D.   On: 10/05/2019 16:38    ROS: As per H&P, other systems reviewed are negative. Blood pressure (!) 161/88, pulse 98, temperature 99.8 F (37.7 C), temperature source Oral, resp. rate 20, weight 70.1 kg, SpO2 (!) 88 %. 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 Skin: No rash or ulcer.  Assessment/Plan: 1 Acute pulmonary edema/acute hypoxia: Due to missed dialysis treatment and fluid overload.  Plan for HD this morning for ultrafiltration.  2 ESRD: MWF schedule.  He missed 2 treatments last week and had incomplete treatment yesterday.  Plan for extra treatment today.  He has AV fistula for the access.  3  Hypertension: BP elevated.  Plan for ultrafiltration during HD.  Resume home medication  4. Anemia of ESRD: Had Mircera on 09/28/2019.  Monitor hemoglobin.  5. Metabolic Bone Disease: Check phosphorus level.  Continue both PhosLo and Renvela.  Need to better control phosphorus level which might be contributing itching.   Jalene Demo Tanna Furry 10/06/2019, 6:49 AM

## 2019-10-06 NOTE — Progress Notes (Signed)
Saw patient and examined in the room. Awaiting HD which should be happening very soon.  Discussed his pruritus which started 3-4 mos ago and gets worse on HD so he is signing off early. Asking for help w/ this.  Has tried topicals and benadryl ("eating like candy"). Will start gabapentin.  HD today.   Kelly Splinter, MD 10/06/2019, 11:05 AM

## 2019-10-06 NOTE — Progress Notes (Signed)
New Admission Note:   Arrival Method: ED via stretcher Mental Orientation: A&O x4 Telemetry: Box10, CCMD notified Assessment: to be completed Skin:refer to flowsheet IV: RUA, NSL Pain: 0/10 Tubes: None Safety Measures: Safety Fall Prevention Plan has been discussed  Admission: to be completed 5 [place] Orientation: Patient has been orientated to the room, unit and staff.   Family: none at bedside  Orders to be reviewed and implemented. Will continue to monitor the patient. Call light has been placed within reach and bed alarm has been activated.

## 2019-10-06 NOTE — Progress Notes (Signed)
Progress Note    Jordan [name]  [id number] DOB: 08/18/1966  DOA: 10/05/2019 PCP: Jearld Fenton, NP    Brief Narrative:     Medical records reviewed and are as summarized below:  Jordan [name] is an 54 y.o. male with medical history significant of anemia, hep B, ESRD on hemodialysis on M/W/F, dyspnea, ED, EtOH abuse, hypertension, active smoker of about 4 cigarettes a day who is coming to the emergency department due to progressively worse dyspnea for the past few days associated with pink-tinged sputum and fatigue.  His initial O2 sat was 86% on room air.  He denies fever, chills or night sweats.  No history of COVID-19 exposure per patient.  He denies chest pain, palpitations, but occasionally feels lightheaded.  No PND, but some orthopnea and difficulty sleeping, but denies lower extremity edema.  Denies abdominal pain, nausea, emesis, melena or hematochezia.  Had diarrhea about 2 days ago, but has not had a BM since then.  He urinates about once a day and denies dysuria, frequency or hematuria.  No polyuria, polydipsia, polyphagia or blurred vision.  Assessment/Plan:   Principal Problem:   Volume overload Active Problems:   Essential hypertension   Anemia in ESRD (end-stage renal disease) (HCC)   Pruritus   Hyperbilirubinemia   Hypokalemia   Volume overload Likely due to incomplete dialysis/missed sessions. HD today and again in the AM -dropped to 80 at rest initially this afternoon but on re-eval was lower 90s at rest but stopped to mid 80s with ambulation    Essential hypertension Monitor blood pressure. Hydralazine as needed. Received Lasix earlier. Has Nitropaste to [facility]    Anemia in ESRD (end-stage renal disease) (HCC) Monitor H&H. Epogen per nephrology.    Pruritus Continue hydroxyzine. -lower phos    Hyperbilirubinemia Check RUQ ultrasound. Follow-up LFTs.   Family Communication/Anticipated D/C date and plan/Code Status    Disposition  Plan: if able to ambulate tomorrow after HD can be d/c'd    Medical Consultants:    nephrology  Subjective:   No complaints  Objective:    Vitals:   10/06/19 1300 10/06/19 1330 10/06/19 1400 10/06/19 1435  BP: (!) 190/111 (!) 183/98 (!) 158/89 (!) 176/86  Pulse: (!) 104 99 (!) 101 (!) 101  Resp:    20  Temp:    97.6 F (36.4 C)  TempSrc:    Oral  SpO2:    100%  Weight:    68.1 kg    Intake/Output Summary (Last 24 hours) at 10/06/2019 1701 Last data filed at 10/06/2019 1418 Gross per 24 hour  Intake 560 ml  Output 3250 ml  Net -2690 ml   Filed Weights   10/06/19 0531 10/06/19 1435  Weight: 70.1 kg 68.1 kg    Exam: In bed, NAD pleasant and cooperative rales in lower lung- some upper airway noise No LE edema  Data Reviewed:   I have personally reviewed following labs and imaging studies:  Labs: Labs show the following:   Basic Metabolic Panel: Recent Labs  Lab 10/01/19 0429 10/01/19 0429 10/05/19 1653 10/06/19 0428 10/06/19 0746  NA 139  --  139 139  --   K 4.2   < > 3.1* 4.2  --   CL 96*  --  96* 97*  --   CO2 24  --  27 24  --   GLUCOSE 100*  --  124* 177*  --   BUN 54*  --  71* 84*  --  CREATININE 12.23*  --  11.29* 12.50*  --   CALCIUM 8.4*  --  8.4* 8.2*  --   MG 2.0  --   --   --   --   PHOS  --   --   --   --  4.9*   < > = values in this interval not displayed.   GFR Estimated Creatinine Clearance: 5.8 mL/min (A) (by C-G formula based on SCr of 12.5 mg/dL (H)). Liver Function Tests: Recent Labs  Lab 10/05/19 1653 10/06/19 0428  AST 37 32  ALT 41 42  ALKPHOS 155* 136*  BILITOT 2.5* 2.2*  PROT 6.5 6.3*  ALBUMIN 3.1* 2.9*   No results for input(s): LIPASE, AMYLASE in the last 168 hours. No results for input(s): AMMONIA in the last 168 hours. Coagulation profile Recent Labs  Lab 10/05/19 1653  INR 1.4*    CBC: Recent Labs  Lab 10/01/19 0429 10/05/19 1653 10/06/19 0428  WBC 9.6 9.8 9.1  NEUTROABS 7.7 7.6  --   HGB  6.8* 8.1* 7.4*  HCT 21.1* 24.8* 22.6*  MCV 103.9* 100.8* 100.4*  PLT 155 155 148*   Cardiac Enzymes: No results for input(s): CKTOTAL, CKMB, CKMBINDEX, TROPONINI in the last 168 hours. BNP (last 3 results) No results for input(s): PROBNP in the last 8760 hours. CBG: No results for input(s): GLUCAP in the last 168 hours. D-Dimer: No results for input(s): DDIMER in the last 72 hours. Hgb A1c: No results for input(s): HGBA1C in the last 72 hours. Lipid Profile: No results for input(s): CHOL, HDL, LDLCALC, TRIG, CHOLHDL, LDLDIRECT in the last 72 hours. Thyroid function studies: No results for input(s): TSH, T4TOTAL, T3FREE, THYROIDAB in the last 72 hours.  Invalid input(s): FREET3 Anemia work up: No results for input(s): VITAMINB12, FOLATE, FERRITIN, TIBC, IRON, RETICCTPCT in the last 72 hours. Sepsis Labs: Recent Labs  Lab 10/01/19 0429 10/05/19 1653 10/06/19 0428  WBC 9.6 9.8 9.1  LATICACIDVEN  --  1.8  --     Microbiology Recent Results (from the past 240 hour(s))  Respiratory Panel by RT PCR (Flu A&B, Covid) - Nasopharyngeal Swab     Status: None   Collection Time: 10/01/19  4:29 AM   Specimen: Nasopharyngeal Swab  Result Value Ref Range Status   SARS Coronavirus 2 by RT PCR NEGATIVE NEGATIVE Final    Comment: (NOTE) SARS-CoV-2 target nucleic acids are NOT DETECTED. The SARS-CoV-2 RNA is generally detectable in upper respiratoy specimens during the acute phase of infection. The lowest concentration of SARS-CoV-2 viral copies this assay can detect is 131 copies/mL. A negative result does not preclude SARS-Cov-2 infection and should not be used as the sole basis for treatment or other patient management decisions. A negative result may occur with  improper specimen collection/handling, submission of specimen other than nasopharyngeal swab, presence of viral mutation(s) within the areas targeted by this assay, and inadequate number of viral copies (<131 copies/mL). A  negative result must be combined with clinical observations, patient history, and epidemiological information. The expected result is Negative. Fact Sheet for Patients:  PinkCheek.be Fact Sheet for Healthcare Providers:  GravelBags.it This test is not yet ap proved or cleared by the [place] FDA and  has been authorized for detection and/or diagnosis of SARS-CoV-2 by FDA under an Emergency Use Authorization (EUA). This EUA will remain  in effect (meaning this test can be used) for the duration of the COVID-19 declaration under Section 564(b)(1) of the Act, 21 U.S.C. section 360bbb-3(b)(1),  unless the authorization is terminated or revoked sooner.    Influenza A by PCR NEGATIVE NEGATIVE Final   Influenza B by PCR NEGATIVE NEGATIVE Final    Comment: (NOTE) The Xpert Xpress SARS-CoV-2/FLU/RSV assay is intended as an aid in  the diagnosis of influenza from Nasopharyngeal swab specimens and  should not be used as a sole basis for treatment. Nasal washings and  aspirates are unacceptable for Xpert Xpress SARS-CoV-2/FLU/RSV  testing. Fact Sheet for Patients: PinkCheek.be Fact Sheet for Healthcare Providers: GravelBags.it This test is not yet approved or cleared by the [place] FDA and  has been authorized for detection and/or diagnosis of SARS-CoV-2 by  FDA under an Emergency Use Authorization (EUA). This EUA will remain  in effect (meaning this test can be used) for the duration of the  Covid-19 declaration under Section 564(b)(1) of the Act, 21  U.S.C. section 360bbb-3(b)(1), unless the authorization is  terminated or revoked. Performed at [facility], [place] [address] [place], [place] [address]   Respiratory Panel by RT PCR (Flu A&B, Covid) - Nasopharyngeal Swab     Status: None   Collection Time: 10/05/19 10:22 PM   Specimen: Nasopharyngeal Swab   Result Value Ref Range Status   SARS Coronavirus 2 by RT PCR NEGATIVE NEGATIVE Final    Comment: (NOTE) SARS-CoV-2 target nucleic acids are NOT DETECTED. The SARS-CoV-2 RNA is generally detectable in upper respiratoy specimens during the acute phase of infection. The lowest concentration of SARS-CoV-2 viral copies this assay can detect is 131 copies/mL. A negative result does not preclude SARS-Cov-2 infection and should not be used as the sole basis for treatment or other patient management decisions. A negative result may occur with  improper specimen collection/handling, submission of specimen other than nasopharyngeal swab, presence of viral mutation(s) within the areas targeted by this assay, and inadequate number of viral copies (<131 copies/mL). A negative result must be combined with clinical observations, patient history, and epidemiological information. The expected result is Negative. Fact Sheet for Patients:  PinkCheek.be Fact Sheet for Healthcare Providers:  GravelBags.it This test is not yet ap proved or cleared by the [place] FDA and  has been authorized for detection and/or diagnosis of SARS-CoV-2 by FDA under an Emergency Use Authorization (EUA). This EUA will remain  in effect (meaning this test can be used) for the duration of the COVID-19 declaration under Section 564(b)(1) of the Act, 21 U.S.C. section 360bbb-3(b)(1), unless the authorization is terminated or revoked sooner.    Influenza A by PCR NEGATIVE NEGATIVE Final   Influenza B by PCR NEGATIVE NEGATIVE Final    Comment: (NOTE) The Xpert Xpress SARS-CoV-2/FLU/RSV assay is intended as an aid in  the diagnosis of influenza from Nasopharyngeal swab specimens and  should not be used as a sole basis for treatment. Nasal washings and  aspirates are unacceptable for Xpert Xpress SARS-CoV-2/FLU/RSV  testing. Fact Sheet for  Patients: PinkCheek.be Fact Sheet for Healthcare Providers: GravelBags.it This test is not yet approved or cleared by the [place] FDA and  has been authorized for detection and/or diagnosis of SARS-CoV-2 by  FDA under an Emergency Use Authorization (EUA). This EUA will remain  in effect (meaning this test can be used) for the duration of the  Covid-19 declaration under Section 564(b)(1) of the Act, 21  U.S.C. section 360bbb-3(b)(1), unless the authorization is  terminated or revoked. Performed at [facility], [place] [address], [place], [place] [address]     Procedures  and diagnostic studies:  DG Chest Port 1 View  Result Date: 10/05/2019 CLINICAL DATA:  Short of breath EXAM: PORTABLE CHEST 1 VIEW COMPARISON:  10/01/2019, CT 07/22/2019, 07/22/2019, 02/18/2019 FINDINGS: Stable mild cardiomegaly. Vascular calcification. Diffuse bilateral interstitial and ground-glass opacity.aortic atherosclerosis. No pneumothorax. IMPRESSION: Cardiomegaly with vascular congestion. Diffuse bilateral interstitial and ground-glass opacity, favor pulmonary edema. Electronically Signed   By: Donavan Foil M.D.   On: 10/05/2019 16:38    Medications:   . calcium acetate  1,334 mg Oral TID WC  . Chlorhexidine Gluconate Cloth  6 each Topical Q0600  . Chlorhexidine Gluconate Cloth  6 each Topical Q0600  . gabapentin  100 mg Oral QHS  . heparin  5,000 Units Subcutaneous Q8H  . hydrOXYzine  50 mg Oral TID  . nicotine  14 mg Transdermal Daily  . sevelamer carbonate  2,400 mg Oral TID WC   Continuous Infusions:   LOS: 0 days   Geradine Girt  [facility]   How to contact the [facility] Attending or Consulting provider [place] or covering provider during after hours [place], for this patient?  1. Check the care team in [facility] and look for a) attending/consulting [facility] provider listed and b) the [facility] team listed 2. Log into [email or web address] and use  's universal password to access. If you do not have the password, please contact the hospital operator. 3. Locate the [facility] provider you are looking for under [facility] and page to a number that you can be directly reached. 4. If you still have difficulty reaching the provider, please page the [facility] (Director on Call) for the Hospitalists listed on amion for assistance.  10/06/2019, 5:01 PM

## 2019-10-07 DIAGNOSIS — E876 Hypokalemia: Secondary | ICD-10-CM

## 2019-10-07 DIAGNOSIS — L299 Pruritus, unspecified: Secondary | ICD-10-CM

## 2019-10-07 MED ORDER — GABAPENTIN 100 MG PO CAPS: 100.0000 mg | ORAL_CAPSULE | Freq: Every day | ORAL | 0 refills | Status: DC

## 2019-10-07 MED ORDER — NICOTINE 14 MG/24HR TD PT24: 14.0000 mg | MEDICATED_PATCH | Freq: Every day | TRANSDERMAL | 0 refills | Status: DC

## 2019-10-07 NOTE — Progress Notes (Signed)
[facility] Progress Note   Subjective:   Patient seen on HD. On room air, VSS on HD. Only complaint is itching, was started on gabapentin yesterday and referred to dermatology outpatient. Denies SOB, orthopnea, CP, palpitations, abdominal pain, N/V/D. Wants to go home today.   Objective Vitals:   10/07/19 0555 10/07/19 0730 10/07/19 0740 10/07/19 0745  BP: (!) 145/94 (!) 151/81 (!) 145/71 136/79  Pulse: 99 99 99 97  Resp: 20 18 (!) 25 (!) 21  Temp: 98.1 F (36.7 C) 98.9 F (37.2 C)    TempSrc: Oral Oral    SpO2: 94% 91%    Weight:  68.5 kg     Physical Exam General: Well developed, well nourished male. Alert and in NAD Heart: RRR, no murmurs, rubs or gallops Lungs: Lungs CTA bilaterally without wheezing, rhonchi or rales Abdomen: Abdomen soft, non-tender, non-distended. +BS Extremities: No peripheral edema Dialysis Access:  LUE AVF currently accessed Skin: Scattered papules posterior lower extremities, no erythema/drainage  Additional Objective Labs: Basic Metabolic Panel: Recent Labs  Lab 10/01/19 0429 10/05/19 1653 10/06/19 0428 10/06/19 0746  NA 139 139 139  --   K 4.2 3.1* 4.2  --   CL 96* 96* 97*  --   CO2 24 27 24   --   GLUCOSE 100* 124* 177*  --   BUN 54* 71* 84*  --   CREATININE 12.23* 11.29* 12.50*  --   CALCIUM 8.4* 8.4* 8.2*  --   PHOS  --   --   --  4.9*   Liver Function Tests: Recent Labs  Lab 10/05/19 1653 10/06/19 0428  AST 37 32  ALT 41 42  ALKPHOS 155* 136*  BILITOT 2.5* 2.2*  PROT 6.5 6.3*  ALBUMIN 3.1* 2.9*   CBC: Recent Labs  Lab 10/01/19 0429 10/05/19 1653 10/06/19 0428  WBC 9.6 9.8 9.1  NEUTROABS 7.7 7.6  --   HGB 6.8* 8.1* 7.4*  HCT 21.1* 24.8* 22.6*  MCV 103.9* 100.8* 100.4*  PLT 155 155 148*   Blood Culture    Component Value Date/Time   SDES BLOOD 02/11/2019 1354   SPECREQUEST  02/11/2019 1354    BOTTLES DRAWN AEROBIC AND ANAEROBIC Blood Culture results may not be optimal due to an inadequate  volume of blood received in culture bottles   CULT  02/11/2019 1354    NO GROWTH 5 DAYS Performed at [facility], [address] [place], [place] [address]    REPTSTATUS 02/16/2019 FINAL 02/11/2019 1354     Studies/Results: DG Chest Port 1 View  Result Date: 10/05/2019 CLINICAL DATA:  Short of breath EXAM: PORTABLE CHEST 1 VIEW COMPARISON:  10/01/2019, CT 07/22/2019, 07/22/2019, 02/18/2019 FINDINGS: Stable mild cardiomegaly. Vascular calcification. Diffuse bilateral interstitial and ground-glass opacity.aortic atherosclerosis. No pneumothorax. IMPRESSION: Cardiomegaly with vascular congestion. Diffuse bilateral interstitial and ground-glass opacity, favor pulmonary edema. Electronically Signed   By: Donavan Foil M.D.   On: 10/05/2019 16:38   Medications:  . calcium acetate  1,334 mg Oral TID WC  . Chlorhexidine Gluconate Cloth  6 each Topical Q0600  . Chlorhexidine Gluconate Cloth  6 each Topical Q0600  . gabapentin  100 mg Oral QHS  . heparin  5,000 Units Subcutaneous Q8H  . hydrOXYzine  50 mg Oral TID  . nicotine  14 mg Transdermal Daily  . sevelamer carbonate  2,400 mg Oral TID WC    Dialysis Orders: Dialyzes at [place] 68 kg. 4hours HD Bath 2K, 2 Ca , Dialyzer  180 NR, Heparin none. Access left UE AVF + Mircera 225 mcg on 1/6  Assessment/Plan: 1 Acute pulmonary edema/acute hypoxia: Due to missed/shortened dialysis treatment and fluid overload. Completed HD yesterday with net UF 3L, on HD again today and tolerating well. On RA. May need EDW lowered at discharge.   2 ESRD: MWF schedule.  He missed 2 treatments last week and had incomplete treatment on 1/13.  Had extra treatment yesterday, now back on MWF schedule. Last K+ 4.2. Encouraged compliance, has been shortening treatments due to itching.   3 Hypertension: BP moderately elevated, improving on HD. Continue home meds, UF 3L with HD today as tolerated.   4. Anemia of ESRD: On max ESA, no bleeding  reported. Had Mircera on 09/28/2019- next dose due 1/21.  Hgb 7.4. Transfuse PRN.  5. Metabolic Bone Disease: Phos 4.9, corrected calcium 9.1.  Continue both PhosLo and Renvela.   6. Itching: Has tried topical creams, benadryl and hydroxyzine with minimal relief. Phos is actually at goal. Started on gabapentin yesterday and has been referred to dermatology outpatient.   7. Hyperbilirubinemia: Per primary.  Anice Paganini, PA-C 10/07/2019, 9:08 AM  [facility] Pager: [phone]

## 2019-10-07 NOTE — Progress Notes (Signed)
SATURATION QUALIFICATIONS: (This note is used to comply with regulatory documentation for home oxygen)  Patient Saturations on Room Air at Rest = 97%  Patient Saturations on Room Air while Ambulating = 91%   

## 2019-10-07 NOTE — Discharge Summary (Addendum)
Physician Discharge Summary  Jordan [name]:[id number] DOB: [birth year] DOA: 10/05/2019  PCP: Jearld Fenton, NP  Admit date: 10/05/2019 Discharge date: 10/07/2019  Time spent: 45 minutes  Recommendations for Outpatient Follow-up:  1. Resume MWF dialysis schedule 2. Encouraged compliance and will need control of phos for itching   Discharge Diagnoses:  Principal Problem:   Volume overload Active Problems:   Essential hypertension   Anemia in ESRD (end-stage renal disease) ([place])   Pruritus   Hyperbilirubinemia   Hypokalemia   Acute respiratory distress   Discharge Condition: stable  Diet recommendation: renal  Filed Weights   10/06/19 1900 10/07/19 0730 10/07/19 1040  Weight: 69.2 kg 68.5 kg 66 kg    History of present illness:  Jordan [name] is a 54 y.o. male with medical history significant of anemia, hep B, ESRD on hemodialysis on M/W/F, dyspnea, ED, EtOH abuse, hypertension, active smoker of about 4 cigarettes a day who presented 10/05/19 to the emergency department due to progressively worse dyspnea for the previous few days associated with pink-tinged sputum and fatigue.  His initial O2 sat was 86% on room air.  He denied fever, chills or night sweats.  No history of COVID-19 exposure per patient.  He denied chest pain, palpitations, but occasionally felt lightheaded.  No PND, but some orthopnea and difficulty sleeping, but denied lower extremity edema.  Denied abdominal pain, nausea, emesis, melena or hematochezia.  Had diarrhea about 2 days prior, but has not had a BM since then.  He urinates about once a day and denied dysuria, frequency or hematuria.  No polyuria, polydipsia, polyphagia or blurred vision.  Hospital Course:  Volume overload Likely due to incomplete dialysis/missed sessions.HD  1/14 and 1/15 per nephrology. Oxygen saturation level greater than 97% on room air at rest and 91% on room air with ambulation. Encouraged to comply with HD  sessions  Essential hypertension elevated on admission likely related to missed dialysis. Received Lasix in the emergency department.  Controlled at discharge.  Anemia in ESRD (end-stage renal disease) ([place]) stable at baseline. Epogen per nephrology.  Pruritus Continue hydroxyzine.  Also prescribed gabapentin will continue at discharge.  Per nephrology note phosphorus at goal  Hyperbilirubinemia OP follow up. Total bili 2.2 which is a little higher than baseline. Alk phos 136.    Procedures:  Dialysis 1/14 and 1/15  Consultations:  [facility] nephrology  Discharge Exam: Vitals:   10/07/19 1040 10/07/19 1100  BP: (!) 153/74 (!) 151/84  Pulse: 97   Resp: 20 18  Temp: 98.4 F (36.9 C) 98.2 F (36.8 C)  SpO2: 93% 93%    General: Awake alert no acute distress Cardiovascular: Regular rate and rhythm no murmur gallop or rub no lower extremity edema Respiratory: Nondistended soft positive bowel sounds throughout no guarding or rebounding  Discharge Instructions   Discharge Instructions    Call MD for:  difficulty breathing, headache or visual disturbances   Complete by: As directed    Call MD for:  persistant dizziness or light-headedness   Complete by: As directed    Call MD for:  severe uncontrolled pain   Complete by: As directed    Call MD for:  temperature >100.4   Complete by: As directed    Diet - low sodium heart healthy   Complete by: As directed    Increase activity slowly   Complete by: As directed      Allergies as of 10/07/2019   No Known Allergies  Medication List    TAKE these medications   albuterol 108 (90 Base) MCG/ACT inhaler Commonly known as: VENTOLIN HFA Inhale 2 puffs into the lungs every 4 (four) hours as needed for wheezing or shortness of breath.   calcium acetate 667 MG capsule Commonly known as: PHOSLO Take 2 capsules (1,334 mg total) by mouth 3 (three) times daily with meals.   diphenhydrAMINE 25 mg capsule Commonly  known as: BENADRYL Take 1 capsule (25 mg total) by mouth every 6 (six) hours as needed for itching.   gabapentin 100 MG capsule Commonly known as: NEURONTIN Take 1 capsule (100 mg total) by mouth at bedtime.   hydrOXYzine 50 MG tablet Commonly known as: ATARAX/VISTARIL Take 50 mg by mouth 3 (three) times daily.   nicotine 14 mg/24hr patch Commonly known as: NICODERM CQ - dosed in mg/24 hours Place 1 patch (14 mg total) onto the skin daily. Start taking on: October 08, 2019   sevelamer carbonate 800 MG tablet Commonly known as: RENVELA Take 2,400 mg by mouth 3 (three) times daily.   sildenafil 50 MG tablet Commonly known as: VIAGRA Take 50 mg by mouth daily as needed for erectile dysfunction.      No Known Allergies    The results of significant diagnostics from this hospitalization (including imaging, microbiology, ancillary and laboratory) are listed below for reference.    Significant Diagnostic Studies: MR [facility] RIGHT WO CONTRAST  Result Date: 09/21/2019 CLINICAL DATA:  Soft tissue mass in the right leg. EXAM: MRI OF THE RIGHT FEMUR WITHOUT CONTRAST TECHNIQUE: Multiplanar, multisequence MR imaging of the right femur was performed. No intravenous contrast was administered. COMPARISON:  None. FINDINGS: Bones/Joint/Cartilage No fracture or dislocation. Normal alignment. Small joint effusion. Minimal osteoarthritis of the right hip with mild subchondral reactive marrow changes. Subchondral serpiginous signal abnormality in the right medial femoral condyle with severe surrounding marrow edema which may reflect subchondral osteonecrosis versus a bone infarct. No aggressive osseous lesion. Ligaments Collateral ligaments are intact. Muscles and Tendons Muscles are normal. No muscle atrophy. No intramuscular fluid collection or hematoma. Soft tissue No fluid collection or hematoma.  No soft tissue mass. IMPRESSION: 1. No soft tissue mass at the site of clinical palpable abnormality in  the anterior aspect of the right thigh. 2. Subchondral serpiginous signal abnormality in the right medial femoral condyle with severe surrounding marrow edema which may reflect subchondral osteonecrosis versus a bone infarct. Electronically Signed   By: Kathreen Devoid   On: 09/21/2019 17:57   DG Chest Port 1 View  Result Date: 10/05/2019 CLINICAL DATA:  Short of breath EXAM: PORTABLE CHEST 1 VIEW COMPARISON:  10/01/2019, CT 07/22/2019, 07/22/2019, 02/18/2019 FINDINGS: Stable mild cardiomegaly. Vascular calcification. Diffuse bilateral interstitial and ground-glass opacity.aortic atherosclerosis. No pneumothorax. IMPRESSION: Cardiomegaly with vascular congestion. Diffuse bilateral interstitial and ground-glass opacity, favor pulmonary edema. Electronically Signed   By: Donavan Foil M.D.   On: 10/05/2019 16:38   DG Chest Port 1 View  Result Date: 10/01/2019 CLINICAL DATA:  Dyspnea EXAM: PORTABLE CHEST 1 VIEW COMPARISON:  07/22/2019 FINDINGS: Mild cardiomegaly. Mild interstitial opacities. No pleural effusion or pneumothorax. No focal airspace consolidation. IMPRESSION: Mild cardiomegaly and mild interstitial edema. Electronically Signed   By: Ulyses Jarred M.D.   On: 10/01/2019 04:38    Microbiology: Recent Results (from the past 240 hour(s))  Respiratory Panel by RT PCR (Flu A&B, Covid) - Nasopharyngeal Swab     Status: None   Collection Time: 10/01/19  4:29 AM   Specimen: Nasopharyngeal  Swab  Result Value Ref Range Status   SARS Coronavirus 2 by RT PCR NEGATIVE NEGATIVE Final    Comment: (NOTE) SARS-CoV-2 target nucleic acids are NOT DETECTED. The SARS-CoV-2 RNA is generally detectable in upper respiratoy specimens during the acute phase of infection. The lowest concentration of SARS-CoV-2 viral copies this assay can detect is 131 copies/mL. A negative result does not preclude SARS-Cov-2 infection and should not be used as the sole basis for treatment or other patient management decisions. A  negative result may occur with  improper specimen collection/handling, submission of specimen other than nasopharyngeal swab, presence of viral mutation(s) within the areas targeted by this assay, and inadequate number of viral copies (<131 copies/mL). A negative result must be combined with clinical observations, patient history, and epidemiological information. The expected result is Negative. Fact Sheet for Patients:  PinkCheek.be Fact Sheet for Healthcare Providers:  GravelBags.it This test is not yet ap proved or cleared by the [place] FDA and  has been authorized for detection and/or diagnosis of SARS-CoV-2 by FDA under an Emergency Use Authorization (EUA). This EUA will remain  in effect (meaning this test can be used) for the duration of the COVID-19 declaration under Section 564(b)(1) of the Act, 21 U.S.C. section 360bbb-3(b)(1), unless the authorization is terminated or revoked sooner.    Influenza A by PCR NEGATIVE NEGATIVE Final   Influenza B by PCR NEGATIVE NEGATIVE Final    Comment: (NOTE) The Xpert Xpress SARS-CoV-2/FLU/RSV assay is intended as an aid in  the diagnosis of influenza from Nasopharyngeal swab specimens and  should not be used as a sole basis for treatment. Nasal washings and  aspirates are unacceptable for Xpert Xpress SARS-CoV-2/FLU/RSV  testing. Fact Sheet for Patients: PinkCheek.be Fact Sheet for Healthcare Providers: GravelBags.it This test is not yet approved or cleared by the [place] FDA and  has been authorized for detection and/or diagnosis of SARS-CoV-2 by  FDA under an Emergency Use Authorization (EUA). This EUA will remain  in effect (meaning this test can be used) for the duration of the  Covid-19 declaration under Section 564(b)(1) of the Act, 21  U.S.C. section 360bbb-3(b)(1), unless the authorization is   terminated or revoked. Performed at [facility], [place] [address], [place], [place] [address]   Respiratory Panel by RT PCR (Flu A&B, Covid) - Nasopharyngeal Swab     Status: None   Collection Time: 10/05/19 10:22 PM   Specimen: Nasopharyngeal Swab  Result Value Ref Range Status   SARS Coronavirus 2 by RT PCR NEGATIVE NEGATIVE Final    Comment: (NOTE) SARS-CoV-2 target nucleic acids are NOT DETECTED. The SARS-CoV-2 RNA is generally detectable in upper respiratoy specimens during the acute phase of infection. The lowest concentration of SARS-CoV-2 viral copies this assay can detect is 131 copies/mL. A negative result does not preclude SARS-Cov-2 infection and should not be used as the sole basis for treatment or other patient management decisions. A negative result may occur with  improper specimen collection/handling, submission of specimen other than nasopharyngeal swab, presence of viral mutation(s) within the areas targeted by this assay, and inadequate number of viral copies (<131 copies/mL). A negative result must be combined with clinical observations, patient history, and epidemiological information. The expected result is Negative. Fact Sheet for Patients:  PinkCheek.be Fact Sheet for Healthcare Providers:  GravelBags.it This test is not yet ap proved or cleared by the [place] FDA and  has been authorized for detection and/or diagnosis of SARS-CoV-2 by FDA  under an Emergency Use Authorization (EUA). This EUA will remain  in effect (meaning this test can be used) for the duration of the COVID-19 declaration under Section 564(b)(1) of the Act, 21 U.S.C. section 360bbb-3(b)(1), unless the authorization is terminated or revoked sooner.    Influenza A by PCR NEGATIVE NEGATIVE Final   Influenza B by PCR NEGATIVE NEGATIVE Final    Comment: (NOTE) The Xpert Xpress SARS-CoV-2/FLU/RSV assay is intended as an aid  in  the diagnosis of influenza from Nasopharyngeal swab specimens and  should not be used as a sole basis for treatment. Nasal washings and  aspirates are unacceptable for Xpert Xpress SARS-CoV-2/FLU/RSV  testing. Fact Sheet for Patients: PinkCheek.be Fact Sheet for Healthcare Providers: GravelBags.it This test is not yet approved or cleared by the [place] FDA and  has been authorized for detection and/or diagnosis of SARS-CoV-2 by  FDA under an Emergency Use Authorization (EUA). This EUA will remain  in effect (meaning this test can be used) for the duration of the  Covid-19 declaration under Section 564(b)(1) of the Act, 21  U.S.C. section 360bbb-3(b)(1), unless the authorization is  terminated or revoked. Performed at [facility], [place] [address] [place], [place] [address]      Labs: Basic Metabolic Panel: Recent Labs  Lab 10/01/19 0429 10/05/19 1653 10/06/19 0428 10/06/19 0746  NA 139 139 139  --   K 4.2 3.1* 4.2  --   CL 96* 96* 97*  --   CO2 _0 --   GLUCOSE 100* 124* 177*  --   BUN 54* 71* 84*  --   CREATININE 12.23* 11.29* 12.50*  --   CALCIUM 8.4* 8.4* 8.2*  --   MG 2.0  --   --   --   PHOS  --   --   --  4.9*   Liver Function Tests: Recent Labs  Lab 10/05/19 1653 10/06/19 0428  AST 37 32  ALT 41 42  ALKPHOS 155* 136*  BILITOT 2.5* 2.2*  PROT 6.5 6.3*  ALBUMIN 3.1* 2.9*   No results for input(s): LIPASE, AMYLASE in the last 168 hours. No results for input(s): AMMONIA in the last 168 hours. CBC: Recent Labs  Lab 10/01/19 0429 10/05/19 1653 10/06/19 0428  WBC 9.6 9.8 9.1  NEUTROABS 7.7 7.6  --   HGB 6.8* 8.1* 7.4*  HCT 21.1* 24.8* 22.6*  MCV 103.9* 100.8* 100.4*  PLT 155 155 148*   Cardiac Enzymes: No results for input(s): CKTOTAL, CKMB, CKMBINDEX, TROPONINI in the last 168 hours. BNP: BNP (last 3 results) No results for input(s): BNP in the last 8760  hours.  ProBNP (last 3 results) No results for input(s): PROBNP in the last 8760 hours.  CBG: No results for input(s): GLUCAP in the last 168 hours.     Signed:  Radene Gunning NP [facility] 10/07/2019, 12:36 PM  Patient was seen, examined,treatment plan was discussed with the Advance Practice Provider.  I have personally reviewed the clinical findings, labs, EKG, imaging studies and management of this patient in detail. I have also reviewed the orders written for this patient which were under my direction. I agree with the documentation, as recorded by the Advance Practice Provider.   Jordan [name] is a 54 y.o. male here after missing HD.  He is s/p several dialysis sessions.  He is off O2 and no longer short of breath.  Encouraged compliance.   Geradine Girt, DO   How  to contact the [facility] Attending or Consulting provider [place] or covering provider during after hours [place], for this patient?  1. Check the care team in [facility] and look for a) attending/consulting [facility] provider listed and b) the [facility] team listed 2. Log into [email or web address] and use [place]'s universal password to access. If you do not have the password, please contact the hospital operator. 3. Locate the [facility] provider you are looking for under [facility] and page to a number that you can be directly reached. 4. If you still have difficulty reaching the provider, please page the [facility] (Director on Call) for the Hospitalists listed on amion for assistance.

## 2019-10-08 ENCOUNTER — Telehealth: Payer: Self-pay | Admitting: Physician Assistant

## 2019-10-08 NOTE — Telephone Encounter (Signed)
Transition of Care from [place]  Date of discharge: 10/07/19 Date of contact: 10/08/19 Method of contact: Phone Spoke to patient  Patient contacted to discuss discharge from recent inpatient hospitalization. Patient was admitted from 10/06/19-10/07/19 with a diagnosis of volume overload from missed HD. Discharge medications reviewed. Patient reports Sarna lotion was used for itching while he was admitted and was very effective, requesting a prescription for this. Rx sent through [employer] to [facility]. Patient has also been referred to a dermatologist.  Anice Paganini, PA-C 10/08/2019, 1:09 PM  [facility] Pager: [phone]

## 2019-10-22 ENCOUNTER — Observation Stay (HOSPITAL_COMMUNITY)
Admission: EM | Admit: 2019-10-22 | Discharge: 2019-10-23 | Disposition: A | Discharge status: Home / Self Care | Payer: Medicare Other | Attending: Internal Medicine | Admitting: Internal Medicine

## 2019-10-22 ENCOUNTER — Encounter (HOSPITAL_COMMUNITY): Payer: Self-pay | Admitting: Emergency Medicine

## 2019-10-22 ENCOUNTER — Emergency Department (HOSPITAL_COMMUNITY): Payer: Medicare Other

## 2019-10-22 ENCOUNTER — Other Ambulatory Visit: Payer: Self-pay

## 2019-10-22 DIAGNOSIS — I5033 Acute on chronic diastolic (congestive) heart failure: Secondary | ICD-10-CM | POA: Diagnosis not present

## 2019-10-22 DIAGNOSIS — J810 Acute pulmonary edema: Secondary | ICD-10-CM | POA: Diagnosis present

## 2019-10-22 DIAGNOSIS — Z79899 Other long term (current) drug therapy: Secondary | ICD-10-CM | POA: Diagnosis not present

## 2019-10-22 DIAGNOSIS — J9601 Acute respiratory failure with hypoxia: Secondary | ICD-10-CM | POA: Insufficient documentation

## 2019-10-22 DIAGNOSIS — R509 Fever, unspecified: Secondary | ICD-10-CM | POA: Insufficient documentation

## 2019-10-22 DIAGNOSIS — I509 Heart failure, unspecified: Secondary | ICD-10-CM

## 2019-10-22 DIAGNOSIS — Z20822 Contact with and (suspected) exposure to covid-19: Secondary | ICD-10-CM | POA: Diagnosis not present

## 2019-10-22 DIAGNOSIS — I132 Hypertensive heart and chronic kidney disease with heart failure and with stage 5 chronic kidney disease, or end stage renal disease: Principal | ICD-10-CM | POA: Insufficient documentation

## 2019-10-22 DIAGNOSIS — F17210 Nicotine dependence, cigarettes, uncomplicated: Secondary | ICD-10-CM | POA: Insufficient documentation

## 2019-10-22 DIAGNOSIS — N186 End stage renal disease: Secondary | ICD-10-CM | POA: Diagnosis not present

## 2019-10-22 DIAGNOSIS — L299 Pruritus, unspecified: Secondary | ICD-10-CM | POA: Diagnosis not present

## 2019-10-22 DIAGNOSIS — E8770 Fluid overload, unspecified: Secondary | ICD-10-CM | POA: Diagnosis not present

## 2019-10-22 DIAGNOSIS — D649 Anemia, unspecified: Secondary | ICD-10-CM

## 2019-10-22 DIAGNOSIS — B1910 Unspecified viral hepatitis B without hepatic coma: Secondary | ICD-10-CM | POA: Insufficient documentation

## 2019-10-22 DIAGNOSIS — I700 Atherosclerosis of aorta: Secondary | ICD-10-CM | POA: Diagnosis not present

## 2019-10-22 DIAGNOSIS — N529 Male erectile dysfunction, unspecified: Secondary | ICD-10-CM | POA: Insufficient documentation

## 2019-10-22 DIAGNOSIS — Z992 Dependence on renal dialysis: Secondary | ICD-10-CM | POA: Insufficient documentation

## 2019-10-22 DIAGNOSIS — D631 Anemia in chronic kidney disease: Secondary | ICD-10-CM | POA: Diagnosis not present

## 2019-10-22 DIAGNOSIS — I10 Essential (primary) hypertension: Secondary | ICD-10-CM

## 2019-10-22 LAB — CBC WITH DIFFERENTIAL/PLATELET
Abs Immature Granulocytes: 0.21 10*3/uL — ABNORMAL HIGH (ref 0.00–0.07)
Basophils Absolute: 0 10*3/uL (ref 0.0–0.1)
Basophils Relative: 0 %
Eosinophils Absolute: 0.4 10*3/uL (ref 0.0–0.5)
Eosinophils Relative: 4 %
HCT: 24.5 % — ABNORMAL LOW (ref 39.0–52.0)
Hemoglobin: 7.4 g/dL — ABNORMAL LOW (ref 13.0–17.0)
Immature Granulocytes: 2 %
Lymphocytes Relative: 9 %
Lymphs Abs: 0.9 10*3/uL (ref 0.7–4.0)
MCH: 34.9 pg — ABNORMAL HIGH (ref 26.0–34.0)
MCHC: 30.2 g/dL (ref 30.0–36.0)
MCV: 115.6 fL — ABNORMAL HIGH (ref 80.0–100.0)
Monocytes Absolute: 0.6 10*3/uL (ref 0.1–1.0)
Monocytes Relative: 6 %
Neutro Abs: 7.8 10*3/uL — ABNORMAL HIGH (ref 1.7–7.7)
Neutrophils Relative %: 79 %
Platelets: 146 10*3/uL — ABNORMAL LOW (ref 150–400)
RBC: 2.12 MIL/uL — ABNORMAL LOW (ref 4.22–5.81)
RDW: 19.9 % — ABNORMAL HIGH (ref 11.5–15.5)
WBC: 9.9 10*3/uL (ref 4.0–10.5)
nRBC: 4.5 % — ABNORMAL HIGH (ref 0.0–0.2)

## 2019-10-22 LAB — BASIC METABOLIC PANEL
Anion gap: 14 (ref 5–15)
BUN: 20 mg/dL (ref 6–20)
CO2: 29 mmol/L (ref 22–32)
Calcium: 8 mg/dL — ABNORMAL LOW (ref 8.9–10.3)
Chloride: 97 mmol/L — ABNORMAL LOW (ref 98–111)
Creatinine, Ser: 5.97 mg/dL — ABNORMAL HIGH (ref 0.61–1.24)
GFR calc Af Amer: 12 mL/min — ABNORMAL LOW (ref >60)
GFR calc non Af Amer: 10 mL/min — ABNORMAL LOW (ref >60)
Glucose, Bld: 104 mg/dL — ABNORMAL HIGH (ref 70–99)
Potassium: 3.5 mmol/L (ref 3.5–5.1)
Sodium: 140 mmol/L (ref 135–145)

## 2019-10-22 LAB — HEPATITIS PANEL, ACUTE
HCV Ab: NONREACTIVE
Hep A IgM: NONREACTIVE
Hep B C IgM: UNDETERMINED — AB
Hepatitis B Surface Ag: NONREACTIVE

## 2019-10-22 LAB — HEMOGLOBIN AND HEMATOCRIT, BLOOD
HCT: 26.7 % — ABNORMAL LOW (ref 39.0–52.0)
Hemoglobin: 8.5 g/dL — ABNORMAL LOW (ref 13.0–17.0)

## 2019-10-22 LAB — PREPARE RBC (CROSSMATCH)

## 2019-10-22 LAB — CREATININE, SERUM
Creatinine, Ser: 6.86 mg/dL — ABNORMAL HIGH (ref 0.61–1.24)
GFR calc Af Amer: 10 mL/min — ABNORMAL LOW (ref >60)
GFR calc non Af Amer: 8 mL/min — ABNORMAL LOW (ref >60)

## 2019-10-22 LAB — RESPIRATORY PANEL BY RT PCR (FLU A&B, COVID)
Influenza A by PCR: NEGATIVE
Influenza B by PCR: NEGATIVE
SARS Coronavirus 2 by RT PCR: NEGATIVE

## 2019-10-22 LAB — TROPONIN I (HIGH SENSITIVITY)
Troponin I (High Sensitivity): 92 ng/L — ABNORMAL HIGH (ref <18)
Troponin I (High Sensitivity): 56 ng/L — ABNORMAL HIGH (ref <18)

## 2019-10-22 LAB — HEPATITIS B CORE ANTIBODY, TOTAL: Hep B Core Total Ab: REACTIVE — AB

## 2019-10-22 IMAGING — DX DG CHEST 1V PORT
1 series · 1 of 1 positions shown · non-contrast
Comparison: Prior radiograph from [DATE].

CLINICAL DATA: Initial evaluation for acute shortness of breath.

EXAM:
PORTABLE CHEST 1 VIEW

[chest]
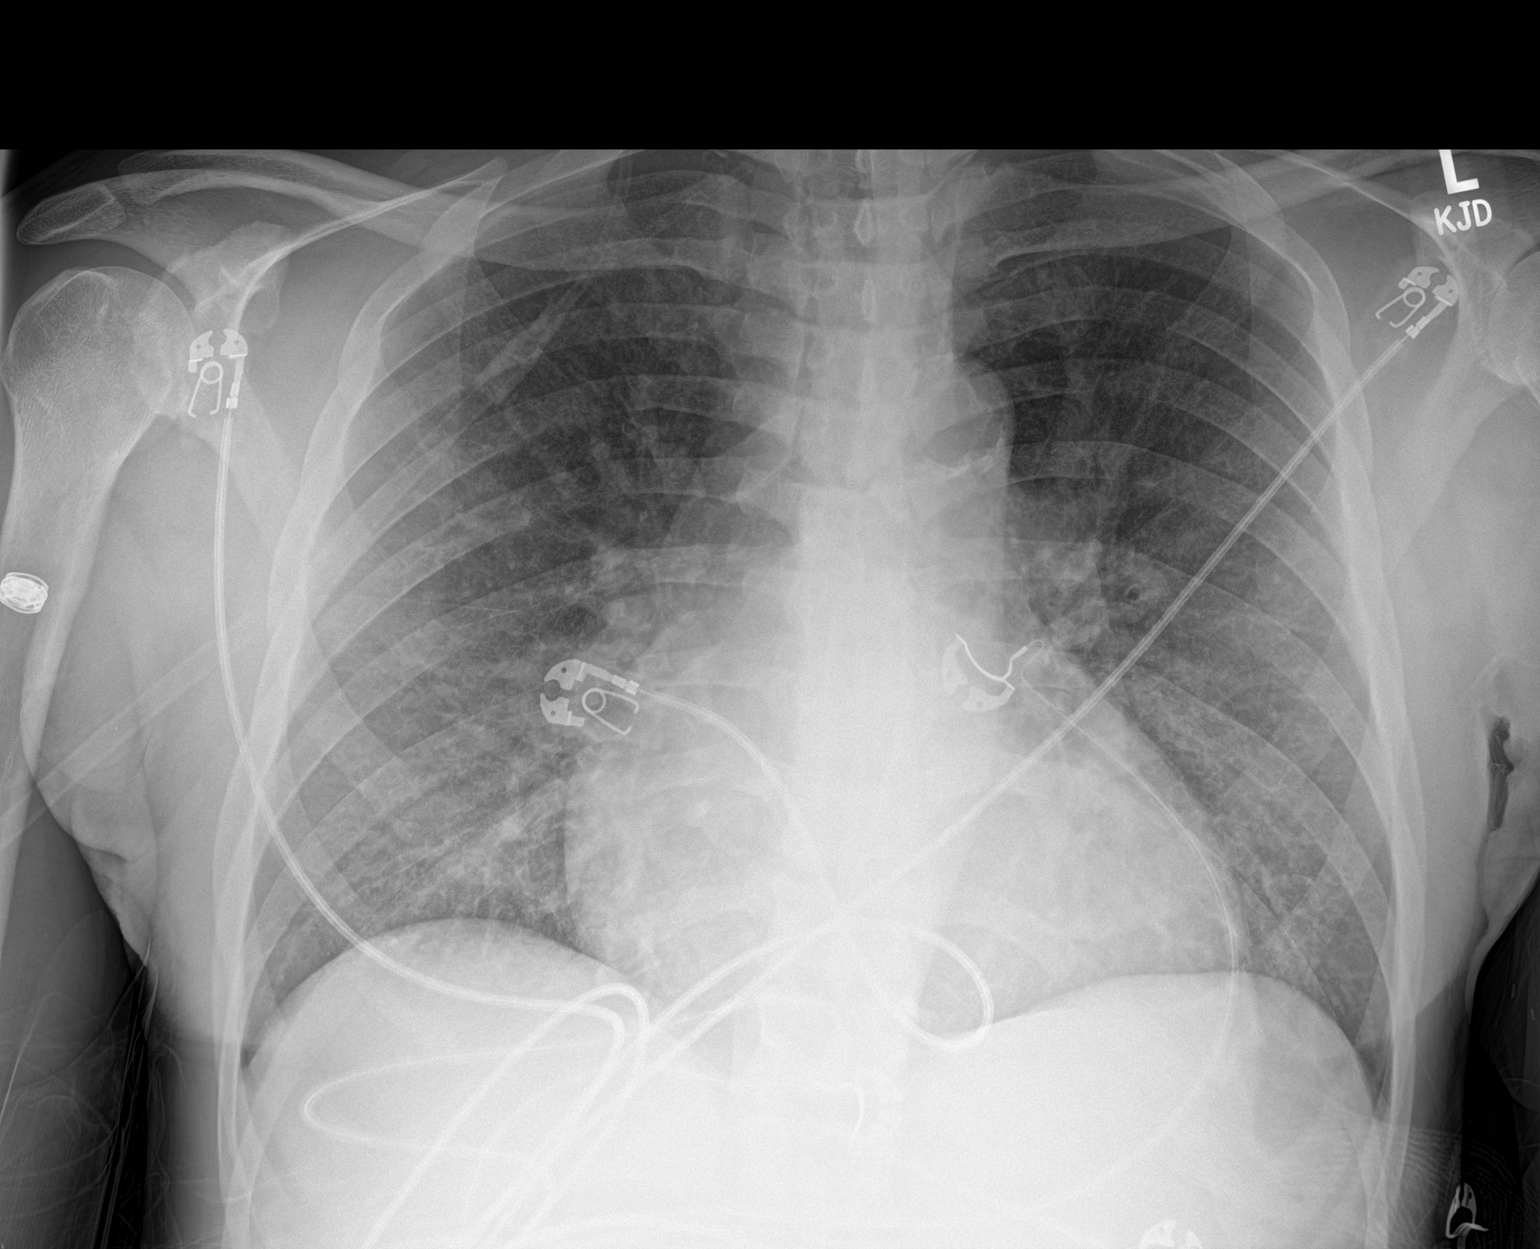

[1 of 1 positions shown; findings below may reference images not displayed]

FINDINGS: Cardiomegaly, stable. Mediastinal silhouette within normal limits.
Aortic atherosclerosis.

Lungs normally inflated. Diffuse vascular congestion with
interstitial prominence, suggesting pulmonary interstitial edema. No
consolidative opacity. No pleural effusion. No pneumothorax.

No acute osseous finding.
IMPRESSION: 1. Cardiomegaly with diffuse pulmonary vascular congestion and
interstitial prominence, suggesting pulmonary interstitial edema.
2.  Aortic Atherosclerosis ([4C]-[4C]).

## 2019-10-22 MED ORDER — CHLORHEXIDINE GLUCONATE CLOTH 2 % EX PADS
6.0000 | MEDICATED_PAD | Freq: Every day | CUTANEOUS | Status: DC
  Administered 2019-10-22 – 2019-10-23 (×2): 6 via TOPICAL

## 2019-10-22 MED ORDER — ALTEPLASE 2 MG IJ SOLR: 2.0000 mg | Freq: Once | INTRAMUSCULAR | Status: DC | PRN

## 2019-10-22 MED ORDER — LIDOCAINE-PRILOCAINE 2.5-2.5 % EX CREA: 1.0000 "application " | TOPICAL_CREAM | CUTANEOUS | Status: DC | PRN

## 2019-10-22 MED ORDER — GABAPENTIN 100 MG PO CAPS
100.0000 mg | ORAL_CAPSULE | Freq: Every day | ORAL | Status: DC
  Administered 2019-10-22: 100 mg via ORAL
  Filled 2019-10-22: qty 1

## 2019-10-22 MED ORDER — HEPARIN SODIUM (PORCINE) 1000 UNIT/ML DIALYSIS: 1000.0000 [IU] | INTRAMUSCULAR | Status: DC | PRN

## 2019-10-22 MED ORDER — SODIUM CHLORIDE 0.9% FLUSH: 3.0000 mL | INTRAVENOUS | Status: DC | PRN

## 2019-10-22 MED ORDER — ALBUTEROL SULFATE HFA 108 (90 BASE) MCG/ACT IN AERS
2.0000 | INHALATION_SPRAY | RESPIRATORY_TRACT | Status: DC | PRN
  Filled 2019-10-22: qty 6.7

## 2019-10-22 MED ORDER — DIPHENHYDRAMINE HCL 50 MG/ML IJ SOLN
12.5000 mg | Freq: Four times a day (QID) | INTRAMUSCULAR | Status: DC | PRN
  Administered 2019-10-22: 50 mg via INTRAVENOUS
  Administered 2019-10-22: 12.5 mg via INTRAVENOUS
  Filled 2019-10-22: qty 1

## 2019-10-22 MED ORDER — SODIUM CHLORIDE 0.9 % IV SOLN: 100.0000 mL | INTRAVENOUS | Status: DC | PRN

## 2019-10-22 MED ORDER — ALBUTEROL SULFATE (2.5 MG/3ML) 0.083% IN NEBU: 2.5000 mg | INHALATION_SOLUTION | RESPIRATORY_TRACT | Status: DC | PRN

## 2019-10-22 MED ORDER — PENTAFLUOROPROP-TETRAFLUOROETH EX AERO: 1.0000 "application " | INHALATION_SPRAY | CUTANEOUS | Status: DC | PRN

## 2019-10-22 MED ORDER — CALCIUM ACETATE (PHOS BINDER) 667 MG PO CAPS
1334.0000 mg | ORAL_CAPSULE | Freq: Three times a day (TID) | ORAL | Status: DC
  Administered 2019-10-22 – 2019-10-23 (×2): 1334 mg via ORAL
  Filled 2019-10-22 (×2): qty 2

## 2019-10-22 MED ORDER — DIPHENHYDRAMINE HCL 50 MG/ML IJ SOLN
INTRAMUSCULAR | Status: AC
  Filled 2019-10-22: qty 1

## 2019-10-22 MED ORDER — NICOTINE 14 MG/24HR TD PT24
14.0000 mg | MEDICATED_PATCH | Freq: Every day | TRANSDERMAL | Status: DC
  Administered 2019-10-22 – 2019-10-23 (×2): 14 mg via TRANSDERMAL
  Filled 2019-10-22 (×2): qty 1

## 2019-10-22 MED ORDER — SODIUM CHLORIDE 0.9% FLUSH
3.0000 mL | Freq: Two times a day (BID) | INTRAVENOUS | Status: DC
  Administered 2019-10-22 (×2): 3 mL via INTRAVENOUS

## 2019-10-22 MED ORDER — ACETAMINOPHEN 325 MG PO TABS
650.0000 mg | ORAL_TABLET | Freq: Four times a day (QID) | ORAL | Status: DC | PRN
  Administered 2019-10-23: 650 mg via ORAL
  Filled 2019-10-22: qty 2

## 2019-10-22 MED ORDER — LIDOCAINE HCL (PF) 1 % IJ SOLN: 5.0000 mL | INTRAMUSCULAR | Status: DC | PRN

## 2019-10-22 MED ORDER — LORATADINE 10 MG PO TABS
10.0000 mg | ORAL_TABLET | Freq: Once | ORAL | Status: AC
  Administered 2019-10-22: 10 mg via ORAL
  Filled 2019-10-22: qty 1

## 2019-10-22 MED ORDER — SODIUM CHLORIDE 0.9% IV SOLUTION: Freq: Once | INTRAVENOUS | Status: DC

## 2019-10-22 MED ORDER — HEPARIN SODIUM (PORCINE) 5000 UNIT/ML IJ SOLN
5000.0000 [IU] | Freq: Three times a day (TID) | INTRAMUSCULAR | Status: DC
  Administered 2019-10-22 (×2): 5000 [IU] via SUBCUTANEOUS
  Filled 2019-10-22 (×2): qty 1

## 2019-10-22 NOTE — ED Notes (Signed)
Pt complaints of itching 10 out of 10, since this morning 3am, pt state receiving a "little white pill", that didn't relief his itching, still being a 10 out 10.

## 2019-10-22 NOTE — Progress Notes (Signed)
Same day note  Patient seen and examined at bedside.  Patient was admitted to the hospital for shortness of breath  At the time of my evaluation, patient complains of tightness of breath especially on exertion.  Complains of leg pruritus.  Nursing staff reported fever of 100.6 F.  Physical examination reveals average built male , not in obvious distress but mildly tachypneic.Marland Kitchen  At the time of my evaluation was on room air.  Chest auscultation with coarse breath sounds.  Legs with pruritic hyperpigmented nodules..  Laboratory data and imaging was reviewed over the last 24 hours  Assessment and Plan.  Shortness of breath acute hypoxic respiratory likely secondary to volume overload from pulmonary vascular congestion.  Patient also developed new fever.  We will send the blood cultures.  Awaiting for hemodialysis.  ED physician has spoken with nephrology about it.  Continue supplemental oxygen.   denies chest pain.  Pruritus.  We will put the patient on Benadryl.  Nicotine use.  We will continue with nicotine patch  Anemia with a hemoglobin of 7.4.  Could transfuse with hemodialysis.  No Charge  Signed,  Delila Pereyra, MD [facility]

## 2019-10-22 NOTE — ED Provider Notes (Signed)
[facility] EMERGENCY DEPARTMENT Provider Note   CSN: [id number] Arrival date & time: 10/22/19  0343     History Chief Complaint  Patient presents with  . Shortness of Breath    Blood Transfusion this morning ( scheduled)   . Skin Itching    Jordan [name] is a 54 y.o. male.  HPI     Past Medical History:  Diagnosis Date  . Anemia   . CKD (chronic kidney disease)    Stage 5  Dialysis - M/W/F in [place], [place]  . Dyspnea    tx with inhaler when sick  . ED (erectile dysfunction)   . ETOH abuse   . History of blood transfusion   . Hypertension   . Wears dentures     Patient Active Problem List   Diagnosis Date Noted  . Acute respiratory distress 10/06/2019  . Volume overload 10/05/2019  . Hypokalemia 10/05/2019  . ESRD (end stage renal disease) ([place]) 10/01/2019  . Leg mass, right   . Hyperbilirubinemia   . Prolonged QT interval 09/19/2019  . Symptomatic anemia 09/19/2019  . Pruritus 09/19/2019  . Anemia in ESRD (end-stage renal disease) ([place]) 05/17/2018  . Hepatitis B 03/19/2018  . ESRD on dialysis ([place]) 05/26/2017  . Essential hypertension 05/26/2017    Past Surgical History:  Procedure Laterality Date  . BASCILIC VEIN TRANSPOSITION Left 08/07/2016   Procedure: LEFT BASILIC VEIN TRANSPOSITION;  Surgeon: Angelia Mould, MD;  Location: [facility];  Service: Vascular;  Laterality: Left;  . CLOSED REDUCTION NASAL FRACTURE N/A 08/11/2019   Procedure: CLOSED REDUCTION NASAL FRACTURE WITH STABILIZATION;  Surgeon: Irene Limbo, MD;  Location: [facility];  Service: Plastics;  Laterality: N/A;  . COLONOSCOPY N/A 08/12/2016   Procedure: COLONOSCOPY;  Surgeon: Otis Brace, MD;  Location: [facility];  Service: Gastroenterology;  Laterality: N/A;  . ESOPHAGOGASTRODUODENOSCOPY N/A 08/12/2016   Procedure: ESOPHAGOGASTRODUODENOSCOPY (EGD);  Surgeon: Otis Brace, MD;  Location: [facility];  Service: Gastroenterology;  Laterality: N/A;  .  INSERTION OF DIALYSIS CATHETER N/A 08/07/2016   Procedure: INSERTION OF TUNNELED DIALYSIS CATHETER;  Surgeon: Angelia Mould, MD;  Location: [facility];  Service: Vascular;  Laterality: N/A;  . LIGATION OF ARTERIOVENOUS  FISTULA Left 09/12/2016   Procedure: BANDING OF LEFT  ARTERIOVENOUS  FISTULA;  Surgeon: Angelia Mould, MD;  Location: [facility];  Service: Vascular;  Laterality: Left;  . LOWER EXTREMITY INTERVENTION Right 12/02/2018   Procedure: LOWER EXTREMITY INTERVENTION;  Surgeon: Algernon Huxley, MD;  Location: [facility];  Service: Cardiovascular;  Laterality: Right;       Family History  Problem Relation Age of Onset  . Diabetes Mother   . Kidney failure Mother   . Kidney failure Brother   . Post-traumatic stress disorder Neg Hx   . Bladder Cancer Neg Hx   . Kidney cancer Neg Hx     Social History   Tobacco Use  . Smoking status: Current Every Day Smoker    Packs/day: 0.50    Years: 15.00    Pack years: 7.50    Types: Cigarettes  . Smokeless tobacco: Never Used  Substance Use Topics  . Alcohol use: Yes    Alcohol/week: 21.0 standard drinks    Types: 21 Cans of beer per week    Comment: none since 08/04/16  . Drug use: No    Home Medications Prior to Admission medications   Medication Sig Start Date End Date Taking? Authorizing Provider  albuterol (PROVENTIL HFA;VENTOLIN HFA)  108 (90 Base) MCG/ACT inhaler Inhale 2 puffs into the lungs every 4 (four) hours as needed for wheezing or shortness of breath. 10/03/18   Orpah Greek, MD  calcium acetate (PHOSLO) 667 MG capsule Take 2 capsules (1,334 mg total) by mouth 3 (three) times daily with meals. Patient not taking: Reported on 10/01/2019 02/12/19   Fritzi Mandes, MD  diphenhydrAMINE (BENADRYL) 25 mg capsule Take 1 capsule (25 mg total) by mouth every 6 (six) hours as needed for itching. 09/22/19 10/22/19  Wyvonnia Dusky, MD  gabapentin (NEURONTIN) 100 MG capsule Take 1 capsule (100 mg total) by  mouth at bedtime. 10/07/19   Black, Lezlie Octave, NP  hydrOXYzine (ATARAX/VISTARIL) 50 MG tablet Take 50 mg by mouth 3 (three) times daily. 08/09/19   [provider]  nicotine (NICODERM CQ - DOSED IN MG/24 HOURS) 14 mg/24hr patch Place 1 patch (14 mg total) onto the skin daily. 10/08/19   Black, Lezlie Octave, NP  sevelamer carbonate (RENVELA) 800 MG tablet Take 2,400 mg by mouth 3 (three) times daily. 09/28/19   [provider]  sildenafil (VIAGRA) 50 MG tablet Take 50 mg by mouth daily as needed for erectile dysfunction. 07/20/19   [provider]    Allergies    Patient has no known allergies.  Review of Systems   Review of Systems  Physical Exam Updated Vital Signs BP 128/78   Pulse 100   Temp 98.7 F (37.1 C) (Oral)   Resp 16   Ht 5\' 4"  (1.626 m)   Wt 75 kg   SpO2 99%   BMI 28.38 kg/m   Physical Exam Vitals and nursing note reviewed.  Constitutional:      Appearance: He is not diaphoretic.  HENT:     Head: Normocephalic and atraumatic.     Nose: Nose normal.  Eyes:     Conjunctiva/sclera: Conjunctivae normal.     Pupils: Pupils are equal, round, and reactive to light.  Cardiovascular:     Rate and Rhythm: Normal rate and regular rhythm.     Pulses: Normal pulses.     Heart sounds: Normal heart sounds.  Pulmonary:     Effort: Tachypnea present.     Breath sounds: Rales present.  Abdominal:     General: Abdomen is flat. Bowel sounds are normal.     Tenderness: There is no abdominal tenderness. There is no guarding.  Musculoskeletal:        General: Normal range of motion.     Cervical back: Normal range of motion and neck supple.  Skin:    General: Skin is warm and dry.     Capillary Refill: Capillary refill takes less than 2 seconds.  Neurological:     General: No focal deficit present.     Mental Status: He is alert and oriented to person, place, and time.     Deep Tendon Reflexes: Reflexes normal.  Psychiatric:        Mood and Affect: Mood  normal.        Behavior: Behavior normal.     ED Results / Procedures / Treatments   Labs (all labs ordered are listed, but only abnormal results are displayed) Results for orders placed or performed during the hospital encounter of 10/22/19  CBC with Differential/Platelet  Result Value Ref Range   WBC 9.9 4.0 - 10.5 K/uL   RBC 2.12 (L) 4.22 - 5.81 MIL/uL   Hemoglobin 7.4 (L) 13.0 - 17.0 g/dL   HCT 24.5 (L)  39.0 - 52.0 %   MCV 115.6 (H) 80.0 - 100.0 fL   MCH 34.9 (H) 26.0 - 34.0 pg   MCHC 30.2 30.0 - 36.0 g/dL   RDW 19.9 (H) 11.5 - 15.5 %   Platelets 146 (L) 150 - 400 K/uL   nRBC 4.5 (H) 0.0 - 0.2 %   Neutrophils Relative % 79 %   Neutro Abs 7.8 (H) 1.7 - 7.7 K/uL   Lymphocytes Relative 9 %   Lymphs Abs 0.9 0.7 - 4.0 K/uL   Monocytes Relative 6 %   Monocytes Absolute 0.6 0.1 - 1.0 K/uL   Eosinophils Relative 4 %   Eosinophils Absolute 0.4 0.0 - 0.5 K/uL   Basophils Relative 0 %   Basophils Absolute 0.0 0.0 - 0.1 K/uL   Immature Granulocytes 2 %   Abs Immature Granulocytes 0.21 (H) 0.00 - 0.07 K/uL  Basic metabolic panel  Result Value Ref Range   Sodium 140 135 - 145 mmol/L   Potassium 3.5 3.5 - 5.1 mmol/L   Chloride 97 (L) 98 - 111 mmol/L   CO2 29 22 - 32 mmol/L   Glucose, Bld 104 (H) 70 - 99 mg/dL   BUN 20 6 - 20 mg/dL   Creatinine, Ser 5.97 (H) 0.61 - 1.24 mg/dL   Calcium 8.0 (L) 8.9 - 10.3 mg/dL   GFR calc non Af Amer 10 (L) >60 mL/min   GFR calc Af Amer 12 (L) >60 mL/min   Anion gap 14 5 - 15  Troponin I (High Sensitivity)  Result Value Ref Range   Troponin I (High Sensitivity) 92 (H) <18 ng/L   DG Chest Portable 1 View  Result Date: 10/22/2019 CLINICAL DATA:  Initial evaluation for acute shortness of breath. EXAM: PORTABLE CHEST 1 VIEW COMPARISON:  Prior radiograph from 10/05/2019. FINDINGS: Cardiomegaly, stable. Mediastinal silhouette within normal limits. Aortic atherosclerosis. Lungs normally inflated. Diffuse vascular congestion with interstitial  prominence, suggesting pulmonary interstitial edema. No consolidative opacity. No pleural effusion. No pneumothorax. No acute osseous finding. IMPRESSION: 1. Cardiomegaly with diffuse pulmonary vascular congestion and interstitial prominence, suggesting pulmonary interstitial edema. 2.  Aortic Atherosclerosis (ICD10-I70.0). Electronically Signed   By: Jeannine Boga M.D.   On: 10/22/2019 04:26   DG Chest Port 1 View  Result Date: 10/05/2019 CLINICAL DATA:  Short of breath EXAM: PORTABLE CHEST 1 VIEW COMPARISON:  10/01/2019, CT 07/22/2019, 07/22/2019, 02/18/2019 FINDINGS: Stable mild cardiomegaly. Vascular calcification. Diffuse bilateral interstitial and ground-glass opacity.aortic atherosclerosis. No pneumothorax. IMPRESSION: Cardiomegaly with vascular congestion. Diffuse bilateral interstitial and ground-glass opacity, favor pulmonary edema. Electronically Signed   By: Donavan Foil M.D.   On: 10/05/2019 16:38   DG Chest Port 1 View  Result Date: 10/01/2019 CLINICAL DATA:  Dyspnea EXAM: PORTABLE CHEST 1 VIEW COMPARISON:  07/22/2019 FINDINGS: Mild cardiomegaly. Mild interstitial opacities. No pleural effusion or pneumothorax. No focal airspace consolidation. IMPRESSION: Mild cardiomegaly and mild interstitial edema. Electronically Signed   By: Ulyses Jarred M.D.   On: 10/01/2019 04:38    EKG EKG Interpretation  Date/Time:  Saturday October 22 2019 04:00:07 EST Ventricular Rate:  103 PR Interval:    QRS Duration: 85 QT Interval:  381 QTC Calculation: 499 R Axis:   48 Text Interpretation: Sinus tachycardia RSR' in V1 or V2, probably normal variant Confirmed by Randal Buba, Keelin Neville ([id number]) on 10/22/2019 4:57:30 AM   Radiology DG Chest Portable 1 View  Result Date: 10/22/2019 CLINICAL DATA:  Initial evaluation for acute shortness of breath. EXAM: PORTABLE CHEST 1 VIEW COMPARISON:  Prior radiograph from 10/05/2019. FINDINGS: Cardiomegaly, stable. Mediastinal silhouette within normal limits.  Aortic atherosclerosis. Lungs normally inflated. Diffuse vascular congestion with interstitial prominence, suggesting pulmonary interstitial edema. No consolidative opacity. No pleural effusion. No pneumothorax. No acute osseous finding. IMPRESSION: 1. Cardiomegaly with diffuse pulmonary vascular congestion and interstitial prominence, suggesting pulmonary interstitial edema. 2.  Aortic Atherosclerosis (ICD10-I70.0). Electronically Signed   By: Jeannine Boga M.D.   On: 10/22/2019 04:26    Procedures Procedures (including critical care time)  Medications Ordered in ED Medications  loratadine (CLARITIN) tablet 10 mg (10 mg Oral Given 10/22/19 [id number])    ED Course  I have reviewed the triage vital signs and the nursing notes.  Pertinent labs & imaging results that were available during my care of the patient were reviewed by me and considered in my medical decision making (see chart for details).    543 case d/w Dr. Justin Mend.  Patient will receive dialysis this am.   Final Clinical Impression(s) / ED Diagnoses  Admit to medicine    Nocholas Damaso, MD 10/22/19 [id number]

## 2019-10-22 NOTE — Progress Notes (Signed)
New Admission Note:   Arrival Method: Bed  Mental Orientation: Alert and oriented  Telemetry: none  Assessment: Completed Skin: intact, scratches on legs   [id number] hand Pain: none  Tubes: none  Safety Measures: Safety Fall Prevention Plan has been given, discussed and signed Admission: Completed [facility] Orientation: Patient has been orientated to the room, unit and staff.  Family: none   Orders have been reviewed and implemented. Will continue to monitor the patient. Call light has been placed within reach and bed alarm has been activated.   Liliane Mallis RN [facility] Phone: [phone]

## 2019-10-22 NOTE — Procedures (Signed)
   I was present at this dialysis session, have reviewed the session itself and made  appropriate changes Kelly Splinter MD [place] pager [phone]   10/22/2019, 3:17 PM

## 2019-10-22 NOTE — H&P (Signed)
History and Physical    Jordan [name] [id number] DOB: 06/19/1966 DOA: 10/22/2019  PCP: Jearld Fenton, NP    Patient coming from: Home    Chief Complaint: Shortness of breath  HPI: Jordan [name] is a 54 y.o. male with medical history significant of  Anemia hepatitis B, end-stage renal disease on hemodialysis Monday Wednesday and Friday ethanol abuse hypertension smoker came with a chief complaint of shortness of breath.  Patient states he wants to be dialyzed today. He has a severe pruritus which tell him he is getting worse. His hemoglobin is 7.4 and he is asking for a unit of blood. The physical exam he is in mild respiratory distress tachypneic and tachycardic.  His O2 is 94%  ED Course:  Emergency room physician discussed the case with nephrology Decided to be dialyzed this morning and possible transfusion Sodium 140, potassium 3.5 CO2 29, BUN 20 creatinine 5.9 hemoglobin 7.4 hematocrit 24 platelets 146 Electrocardiogram sinus tach no acute ST-T changes troponin 92, has chronic elevated troponin around 100  Review of Systems: As per HPI otherwise 10 point review of systems negative.  With the exception of shortness of breath  Past Medical History:  Diagnosis Date  . Anemia   . CKD (chronic kidney disease)    Stage 5  Dialysis - M/W/F in [place], [place]  . Dyspnea    tx with inhaler when sick  . ED (erectile dysfunction)   . ETOH abuse   . History of blood transfusion   . Hypertension   . Wears dentures     Past Surgical History:  Procedure Laterality Date  . BASCILIC VEIN TRANSPOSITION Left 08/07/2016   Procedure: LEFT BASILIC VEIN TRANSPOSITION;  Surgeon: Angelia Mould, MD;  Location: [facility];  Service: Vascular;  Laterality: Left;  . CLOSED REDUCTION NASAL FRACTURE N/A 08/11/2019   Procedure: CLOSED REDUCTION NASAL FRACTURE WITH STABILIZATION;  Surgeon: Irene Limbo, MD;  Location: [facility];  Service: Plastics;  Laterality: N/A;  . COLONOSCOPY  N/A 08/12/2016   Procedure: COLONOSCOPY;  Surgeon: Otis Brace, MD;  Location: [facility];  Service: Gastroenterology;  Laterality: N/A;  . ESOPHAGOGASTRODUODENOSCOPY N/A 08/12/2016   Procedure: ESOPHAGOGASTRODUODENOSCOPY (EGD);  Surgeon: Otis Brace, MD;  Location: [facility];  Service: Gastroenterology;  Laterality: N/A;  . INSERTION OF DIALYSIS CATHETER N/A 08/07/2016   Procedure: INSERTION OF TUNNELED DIALYSIS CATHETER;  Surgeon: Angelia Mould, MD;  Location: [facility];  Service: Vascular;  Laterality: N/A;  . LIGATION OF ARTERIOVENOUS  FISTULA Left 09/12/2016   Procedure: BANDING OF LEFT  ARTERIOVENOUS  FISTULA;  Surgeon: Angelia Mould, MD;  Location: [facility];  Service: Vascular;  Laterality: Left;  . LOWER EXTREMITY INTERVENTION Right 12/02/2018   Procedure: LOWER EXTREMITY INTERVENTION;  Surgeon: Algernon Huxley, MD;  Location: [facility];  Service: Cardiovascular;  Laterality: Right;     reports that he has been smoking cigarettes. He has a 7.50 pack-year smoking history. He has never used smokeless tobacco. He reports current alcohol use of about 21.0 standard drinks of alcohol per week. He reports that he does not use drugs.  No Known Allergies  Family History  Problem Relation Age of Onset  . Diabetes Mother   . Kidney failure Mother   . Kidney failure Brother   . Post-traumatic stress disorder Neg Hx   . Bladder Cancer Neg Hx   . Kidney cancer Neg Hx      Prior to Admission medications   Medication Sig Start Date  End Date Taking? Authorizing Provider  albuterol (PROVENTIL HFA;VENTOLIN HFA) 108 (90 Base) MCG/ACT inhaler Inhale 2 puffs into the lungs every 4 (four) hours as needed for wheezing or shortness of breath. 10/03/18  Yes Pollina, Gwenyth Allegra, MD  calcium acetate (PHOSLO) 667 MG capsule Take 2 capsules (1,334 mg total) by mouth 3 (three) times daily with meals. 02/12/19  Yes Fritzi Mandes, MD  hydrOXYzine (ATARAX/VISTARIL) 50 MG tablet  Take 50 mg by mouth 3 (three) times daily. 08/09/19  Yes [provider]  gabapentin (NEURONTIN) 100 MG capsule Take 1 capsule (100 mg total) by mouth at bedtime. 10/07/19   Black, Lezlie Octave, NP  nicotine (NICODERM CQ - DOSED IN MG/24 HOURS) 14 mg/24hr patch Place 1 patch (14 mg total) onto the skin daily. 10/08/19   Black, Lezlie Octave, NP  sildenafil (VIAGRA) 50 MG tablet Take 50 mg by mouth daily as needed for erectile dysfunction. 07/20/19   [provider]    Physical Exam: Vitals:   10/22/19 0430 10/22/19 0445 10/22/19 0500 10/22/19 0515  BP: (!) 140/109  128/78   Pulse: (!) 101 (!) 104 (!) 107 100  Resp: (!) 29 (!) 21 (!) 33 16  Temp:      TempSrc:      SpO2: 100% 100% 98% 99%  Weight:      Height:        Constitutional: NAD, calm, comfortable Vitals:   10/22/19 0430 10/22/19 0445 10/22/19 0500 10/22/19 0515  BP: (!) 140/109  128/78   Pulse: (!) 101 (!) 104 (!) 107 100  Resp: (!) 29 (!) 21 (!) 33 16  Temp:      TempSrc:      SpO2: 100% 100% 98% 99%  Weight:      Height:       Eyes: PERRL, lids and conjunctivae normal ENMT: Mucous membranes are moist. Posterior pharynx clear of any exudate or lesions.Normal dentition.  Neck: normal, supple, no masses, no thyromegaly Respiratory: clear to auscultation bilaterally, no wheezing, no crackles. Normal respiratory effort. No accessory muscle use.  Cardiovascular: Regular rate and rhythm, no murmurs / rubs / gallops. No extremity edema. 2+ pedal pulses. No carotid bruits.  Abdomen: no tenderness, no masses palpated. No hepatosplenomegaly. Bowel sounds positive.  Musculoskeletal: no clubbing / cyanosis. No joint deformity upper and lower extremities. Good ROM, no contractures. Normal muscle tone.  Skin: no rashes, lesions, ulcers. No induration Neurologic: CN 2-12 grossly intact. Sensation intact, DTR normal. Strength 5/5 in all 4.  Psychiatric: Normal judgment and insight. Alert and oriented x 3. Normal mood.    Labs  on Admission: I have personally reviewed following labs and imaging studies  CBC: Recent Labs  Lab 10/22/19 0358  WBC 9.9  NEUTROABS 7.8*  HGB 7.4*  HCT 24.5*  MCV 115.6*  PLT 123456*   Basic Metabolic Panel: Recent Labs  Lab 10/22/19 0358  NA 140  K 3.5  CL 97*  CO2 29  GLUCOSE 104*  BUN 20  CREATININE 5.97*  CALCIUM 8.0*   GFR: Estimated Creatinine Clearance: 13.4 mL/min (A) (by C-G formula based on SCr of 5.97 mg/dL (H)). Liver Function Tests: No results for input(s): AST, ALT, ALKPHOS, BILITOT, PROT, ALBUMIN in the last 168 hours. No results for input(s): LIPASE, AMYLASE in the last 168 hours. No results for input(s): AMMONIA in the last 168 hours. Coagulation Profile: No results for input(s): INR, PROTIME in the last 168 hours. Cardiac Enzymes: No results for input(s): CKTOTAL, CKMB, CKMBINDEX, TROPONINI  in the last 168 hours. BNP (last 3 results) No results for input(s): PROBNP in the last 8760 hours. HbA1C: No results for input(s): HGBA1C in the last 72 hours. CBG: No results for input(s): GLUCAP in the last 168 hours. Lipid Profile: No results for input(s): CHOL, HDL, LDLCALC, TRIG, CHOLHDL, LDLDIRECT in the last 72 hours. Thyroid Function Tests: No results for input(s): TSH, T4TOTAL, FREET4, T3FREE, THYROIDAB in the last 72 hours. Anemia Panel: No results for input(s): VITAMINB12, FOLATE, FERRITIN, TIBC, IRON, RETICCTPCT in the last 72 hours. Urine analysis:    Component Value Date/Time   COLORURINE STRAW (A) 08/04/2016 0448   APPEARANCEUR CLOUDY (A) 08/04/2016 0448   LABSPEC 1.011 08/04/2016 0448   PHURINE 5.5 08/04/2016 0448   GLUCOSEU 100 (A) 08/04/2016 0448   HGBUR SMALL (A) 08/04/2016 0448   BILIRUBINUR NEGATIVE 08/04/2016 0448   KETONESUR NEGATIVE 08/04/2016 0448   PROTEINUR >300 (A) 08/04/2016 0448   NITRITE NEGATIVE 08/04/2016 0448   LEUKOCYTESUR NEGATIVE 08/04/2016 0448    Radiological Exams on Admission: DG Chest Portable 1  View  Result Date: 10/22/2019 CLINICAL DATA:  Initial evaluation for acute shortness of breath. EXAM: PORTABLE CHEST 1 VIEW COMPARISON:  Prior radiograph from 10/05/2019. FINDINGS: Cardiomegaly, stable. Mediastinal silhouette within normal limits. Aortic atherosclerosis. Lungs normally inflated. Diffuse vascular congestion with interstitial prominence, suggesting pulmonary interstitial edema. No consolidative opacity. No pleural effusion. No pneumothorax. No acute osseous finding. IMPRESSION: 1. Cardiomegaly with diffuse pulmonary vascular congestion and interstitial prominence, suggesting pulmonary interstitial edema. 2.  Aortic Atherosclerosis (ICD10-I70.0). Electronically Signed   By: Jeannine Boga M.D.   On: 10/22/2019 04:26    EKG: Independently reviewed.  Sinus tachycardia no acute ST-T changes  Assessment and plan  Acute on chronic congestive heart failure diastolic dysfunction Volume overloaded To be dialyzed this morning Patient requires to be transfused during dialysis Plan to be dialysis morning based on the conversation between the emergency room physician and nephrology  Chronic anemia secondary to end-stage renal disease Hemoglobin 7.4 Plan possible blood transfusion during dialysis  Essential hypertension Resume home medication  Pruritus Atarax  Assessment/Plan Principal Problem:   Volume overload Active Problems:   ESRD on dialysis ([facility])   Essential hypertension   Anemia in ESRD (end-stage renal disease) ([facility])   Symptomatic anemia   Pruritus   Acute on chronic congestive heart failure ([facility])      DVT prophylaxis: Lovenox Code Status: Full code Family Communication:  Disposition Plan: Home Consults called: Nephrology by emergency room physician to get dialyzed this morning Admission status: Observation   Alleyah Twombly G Cloyde Oregel MD [facility]  If 7PM-7AM, please contact night-coverage [email or web address]   10/22/2019, 6:31 AM

## 2019-10-22 NOTE — Consult Note (Signed)
Renal Service Consult Note [facility]  CAROL BELLUS 10/22/2019 Sol Blazing Requesting Physician:  Dr Louanne Belton  Reason for Consult:  ESRD pt w/ SOB HPI: The patient is a 54 y.o. year-old w/ hx of HTN, etoh/ tobacco and ESRD on HD MWF came to ED w/ SOB asking to have dialysis. Also c/o severe pruritis, not getting better. Says he has to sign off early from HD due to this. Hb 7.4 in ED, pt requesting prbc's, SpO2 94%. CXR +IS edema.  Asked to see for ESRD.   Pt denies any CP or abd pain.  Started on gabapentin for his itching this last admission.  Not doing any better.  May be missing his ESA by signing off HD early frequently.     ROS  denies CP  no joint pain   no HA  no blurry vision  no rash  no diarrhea  no nausea/ vomiting     Past Medical History  Past Medical History:  Diagnosis Date  . Anemia   . CKD (chronic kidney disease)    Stage 5  Dialysis - M/W/F in [place], [place]  . Dyspnea    tx with inhaler when sick  . ED (erectile dysfunction)   . ETOH abuse   . History of blood transfusion   . Hypertension   . Wears dentures    Past Surgical History  Past Surgical History:  Procedure Laterality Date  . BASCILIC VEIN TRANSPOSITION Left 08/07/2016   Procedure: LEFT BASILIC VEIN TRANSPOSITION;  Surgeon: Angelia Mould, MD;  Location: [place];  Service: Vascular;  Laterality: Left;  . CLOSED REDUCTION NASAL FRACTURE N/A 08/11/2019   Procedure: CLOSED REDUCTION NASAL FRACTURE WITH STABILIZATION;  Surgeon: Irene Limbo, MD;  Location: [place];  Service: Plastics;  Laterality: N/A;  . COLONOSCOPY N/A 08/12/2016   Procedure: COLONOSCOPY;  Surgeon: Otis Brace, MD;  Location: [place];  Service: Gastroenterology;  Laterality: N/A;  . ESOPHAGOGASTRODUODENOSCOPY N/A 08/12/2016   Procedure: ESOPHAGOGASTRODUODENOSCOPY (EGD);  Surgeon: Otis Brace, MD;  Location: [place];  Service: Gastroenterology;  Laterality: N/A;  .  INSERTION OF DIALYSIS CATHETER N/A 08/07/2016   Procedure: INSERTION OF TUNNELED DIALYSIS CATHETER;  Surgeon: Angelia Mould, MD;  Location: [place];  Service: Vascular;  Laterality: N/A;  . LIGATION OF ARTERIOVENOUS  FISTULA Left 09/12/2016   Procedure: BANDING OF LEFT  ARTERIOVENOUS  FISTULA;  Surgeon: Angelia Mould, MD;  Location: [place];  Service: Vascular;  Laterality: Left;  . LOWER EXTREMITY INTERVENTION Right 12/02/2018   Procedure: LOWER EXTREMITY INTERVENTION;  Surgeon: Algernon Huxley, MD;  Location: [place] [facility];  Service: Cardiovascular;  Laterality: Right;   Family History  Family History  Problem Relation Age of Onset  . Diabetes Mother   . Kidney failure Mother   . Kidney failure Brother   . Post-traumatic stress disorder Neg Hx   . Bladder Cancer Neg Hx   . Kidney cancer Neg Hx    Social History  reports that he has been smoking cigarettes. He has a 7.50 pack-year smoking history. He has never used smokeless tobacco. He reports current alcohol use of about 21.0 standard drinks of alcohol per week. He reports that he does not use drugs. Allergies No Known Allergies Home medications Prior to Admission medications   Medication Sig Start Date End Date Taking? Authorizing Provider  albuterol (PROVENTIL HFA;VENTOLIN HFA) 108 (90 Base) MCG/ACT inhaler Inhale 2 puffs into the lungs every 4 (four) hours as needed for  wheezing or shortness of breath. 10/03/18  Yes Pollina, Gwenyth Allegra, MD  calcium acetate (PHOSLO) 667 MG capsule Take 2 capsules (1,334 mg total) by mouth 3 (three) times daily with meals. 02/12/19  Yes Fritzi Mandes, MD  hydrOXYzine (ATARAX/VISTARIL) 50 MG tablet Take 50 mg by mouth 3 (three) times daily. 08/09/19  Yes [provider]  gabapentin (NEURONTIN) 100 MG capsule Take 1 capsule (100 mg total) by mouth at bedtime. 10/07/19   Black, Lezlie Octave, NP  nicotine (NICODERM CQ - DOSED IN MG/24 HOURS) 14 mg/24hr patch Place 1 patch (14 mg total) onto  the skin daily. 10/08/19   Black, Lezlie Octave, NP  sildenafil (VIAGRA) 50 MG tablet Take 50 mg by mouth daily as needed for erectile dysfunction. 07/20/19   [provider]   Recent Labs  Lab 10/22/19 0358  WBC 9.9  NEUTROABS 7.8*  HGB 7.4*  HCT 24.5*  MCV 115.6*  PLT 146*   Recent Labs  Lab 10/22/19 0358 10/22/19 0928  NA 140  --   K 3.5  --   CL 97*  --   CO2 29  --   GLUCOSE 104*  --   BUN 20  --   CREATININE 5.97* 6.86*  CALCIUM 8.0*  --    Iron/TIBC/Ferritin/ %Sat    Component Value Date/Time   IRON 110 09/19/2019 1447   TIBC 340 09/19/2019 1447   FERRITIN 727 (H) 09/19/2019 1447   IRONPCTSAT 32 09/19/2019 1447    Vitals:   10/22/19 0630 10/22/19 0800 10/22/19 0816 10/22/19 0911  BP: (!) 146/79 (!) 144/89 (!) 153/98 (!) 142/87  Pulse: (!) 102 (!) 104 (!) 109 (!) 102  Resp: (!) 26  (!) 22 18  Temp:    (!) 100.6 F (38.1 C)  TempSrc:    Oral  SpO2: 95% (!) 88% 98% 91%  Weight:    70.5 kg  Height:    5\' 4"  (1.626 m)    Exam Gen alert, no distress, RA, calm Skin - several hard papular lesions bilat LE's, no drainage or erythema Sclera anicteric, throat clear  No jvd or bruits Chest basilar crackels bilat RRR no MRG Abd soft ntnd no mass or ascites +bs GU normal male MS no joint effusions or deformity Ext no LE edema, no wounds or ulcers Neuro is alert, Ox 3 , nf LUA AVF+bruit    Home meds:  - gabapentin 100 hs/ n icotine patch/ phoslo ac tid/ prn hydroxyzine    Outpt HD: [facility] MWF   4h  68kg   2/2 bath  Hep none  LUA AVF  - mircera 225 last 1/27  - venofer 100mg  x 3 last 1/29 for tsat 15%  - parsa 5  - calci 0.5 tiw  - last Hb 6.8 and trending down   Assessment/ Plan: 1. SOB/ vol overload/ pulm edema - plan HD upstairs soon today. Get vol down and weigh pt post HD, may be losing body wt.  2. ESRD - on HD MWF.  HD today off sched, then reassess 3. Pruritis - sig issue for him, gets worse on HD 4. Anemia ckd - Hb 7.4, pt  requesting prbc's will order 1u   Kelly Splinter  MD 10/22/2019, 11:21 AM

## 2019-10-22 NOTE — ED Triage Notes (Signed)
Patient arrived with EMS from home reports SOB x2 weeks worse with exertion  , patient also complained of generalized skin itching yesterday after hemodialysis treatment , patient added that he is scheduled for blood transfusion this morning . Denies fever or chills .

## 2019-10-22 NOTE — Plan of Care (Signed)
  Problem: Health Behavior/Discharge Planning: Goal: Ability to manage health-related needs will improve Outcome: Progressing   

## 2019-10-22 NOTE — ED Notes (Signed)
Pt oxygen sat dropped to 87% on RA. Pt placed on 2L O2 [place] to maintain O2 above 92%. Pt is sating 98% on 2L O2 [place].

## 2019-10-23 DIAGNOSIS — I509 Heart failure, unspecified: Secondary | ICD-10-CM | POA: Diagnosis not present

## 2019-10-23 DIAGNOSIS — I10 Essential (primary) hypertension: Secondary | ICD-10-CM | POA: Diagnosis not present

## 2019-10-23 DIAGNOSIS — N186 End stage renal disease: Secondary | ICD-10-CM | POA: Diagnosis not present

## 2019-10-23 DIAGNOSIS — E8770 Fluid overload, unspecified: Secondary | ICD-10-CM | POA: Diagnosis not present

## 2019-10-23 DIAGNOSIS — I132 Hypertensive heart and chronic kidney disease with heart failure and with stage 5 chronic kidney disease, or end stage renal disease: Secondary | ICD-10-CM | POA: Diagnosis not present

## 2019-10-23 LAB — CBC
HCT: 30.5 % — ABNORMAL LOW (ref 39.0–52.0)
Hemoglobin: 9.6 g/dL — ABNORMAL LOW (ref 13.0–17.0)
MCH: 34.8 pg — ABNORMAL HIGH (ref 26.0–34.0)
MCHC: 31.5 g/dL (ref 30.0–36.0)
MCV: 110.5 fL — ABNORMAL HIGH (ref 80.0–100.0)
Platelets: 137 10*3/uL — ABNORMAL LOW (ref 150–400)
RBC: 2.76 MIL/uL — ABNORMAL LOW (ref 4.22–5.81)
RDW: 22.5 % — ABNORMAL HIGH (ref 11.5–15.5)
WBC: 9.9 10*3/uL (ref 4.0–10.5)
nRBC: 2.6 % — ABNORMAL HIGH (ref 0.0–0.2)

## 2019-10-23 LAB — COMPREHENSIVE METABOLIC PANEL
ALT: 23 U/L (ref 0–44)
AST: 24 U/L (ref 15–41)
Albumin: 3.6 g/dL (ref 3.5–5.0)
Alkaline Phosphatase: 192 U/L — ABNORMAL HIGH (ref 38–126)
Anion gap: 15 (ref 5–15)
BUN: 23 mg/dL — ABNORMAL HIGH (ref 6–20)
CO2: 28 mmol/L (ref 22–32)
Calcium: 8.4 mg/dL — ABNORMAL LOW (ref 8.9–10.3)
Chloride: 98 mmol/L (ref 98–111)
Creatinine, Ser: 5.94 mg/dL — ABNORMAL HIGH (ref 0.61–1.24)
GFR calc Af Amer: 12 mL/min — ABNORMAL LOW (ref >60)
GFR calc non Af Amer: 10 mL/min — ABNORMAL LOW (ref >60)
Glucose, Bld: 119 mg/dL — ABNORMAL HIGH (ref 70–99)
Potassium: 3.8 mmol/L (ref 3.5–5.1)
Sodium: 141 mmol/L (ref 135–145)
Total Bilirubin: 2 mg/dL — ABNORMAL HIGH (ref 0.3–1.2)
Total Protein: 7.3 g/dL (ref 6.5–8.1)

## 2019-10-23 LAB — MAGNESIUM: Magnesium: 2.2 mg/dL (ref 1.7–2.4)

## 2019-10-23 MED ORDER — CALCITRIOL 0.5 MCG PO CAPS: 0.5000 ug | ORAL_CAPSULE | ORAL | Status: DC

## 2019-10-23 MED ORDER — TRIAMCINOLONE ACETONIDE 0.025 % EX OINT: 1.0000 "application " | TOPICAL_OINTMENT | Freq: Two times a day (BID) | CUTANEOUS | 2 refills | Status: DC

## 2019-10-23 MED ORDER — CHLORHEXIDINE GLUCONATE CLOTH 2 % EX PADS: 6.0000 | MEDICATED_PAD | Freq: Every day | CUTANEOUS | Status: DC

## 2019-10-23 MED ORDER — CALCITRIOL 0.5 MCG PO CAPS: 0.5000 ug | ORAL_CAPSULE | ORAL | 0 refills | Status: DC

## 2019-10-23 MED ORDER — RENA-VITE PO TABS: 1.0000 | ORAL_TABLET | Freq: Every day | ORAL | Status: DC

## 2019-10-23 NOTE — Discharge Summary (Signed)
Physician Discharge Summary  Jordan [name] [id number] DOB: 04-May-1966 DOA: 10/22/2019  PCP: Jearld Fenton, NP  Admit date: 10/22/2019 Discharge date: 10/23/2019  Admitted From: Home  Discharge disposition: Home   Recommendations for Outpatient Follow-Up:   . Follow up with your primary care provider in one week.  . Check CBC, BMP in the next visit. . Blood cultures have been drawn for mild fever on 10/23/19.  Patient insisted on getting discharged but will need to follow-up blood cultures.  He looks clinically okay.  He has been advised to seek medical attention if his clinical condition worsened or if had persistent fever.   Discharge Diagnosis:   Principal Problem:   Volume overload Active Problems:   ESRD on dialysis [facility])   Essential hypertension   Anemia in ESRD (end-stage renal disease) (HCC)   Symptomatic anemia   Pruritus   Acute on chronic congestive heart failure ([place]) Fever  Discharge Condition: Improved.  Diet recommendation: Low sodium, heart healthy.    Wound care: None.  Code status: Full.   History of Present Illness:   Jordan [name] is a 54 y.o. male with medical history significant for  Anemia, hepatitis B, end-stage renal disease on hemodialysis Monday Wednesday and Friday, ethanol abuse, hypertension, smoker who presented to the hospital with  shortness of breath. On physical exam he was in mild respiratory distress, was tachypneic and tachycardic.  ED Course:  Sodium 140, potassium 3.5 CO2 29, BUN 20 creatinine 5.9 hemoglobin 7.4 hematocrit 24 platelets 146. Electrocardiogram sinus tach no acute ST-T changes troponin 92, has chronic elevated troponin around 100   Hospital Course:   Following conditions were addressed during hospitalization as listed below,  Shortness of breath, acute hypoxic respiratory failure likely secondary to volume overload from pulmonary vascular congestion.  Status post hemodialysis with resolution of his  symptoms.  He was advised compliance with hemodialysis and quitting smoking.  Mild fever.  Influenza was negative. COVID-19 was negative.  Patient denies any cough or sputum production.  No localizing signs of infection.  Patient is insistent on getting discharged today due to his brother's wedding.  Blood cultures have been drawn today.  Will need to follow-up blood cultures on discharge.  Patient was advised to come to the hospital or seek medical attention if he were to develop further/worsening symptoms.  Pruritus nodules over the legs.    Continue Atarax, add triamcinolone cream on discharge.  Patient was emphasized the need for compliance with hemodialysis  Nicotine use.    Patient was counseled against smoking.  Anemia with a hemoglobin of 7.4.    Received 1 unit of packed RBC.  His shortness of breath has improved.   Disposition.  At this time, patient is stable for disposition home.  He was advised to seek medical attention for ongoing fever or worsening symptoms.  Medical Consultants:    Nephrology  Procedures:    Hemodialysis PRBC transfusion  Subjective:   Today, patient feels okay.  Ambulating okay.  Feels okay with breathing, no cough or sputum production.  Discharge Exam:   Vitals:   10/23/19 0502 10/23/19 0919  BP: 139/85 (!) 146/99  Pulse: (!) 105 100  Resp: 18 18  Temp: 98.2 F (36.8 C) 100.3 F (37.9 C)  SpO2: 94% 94%   Vitals:   10/22/19 1729 10/22/19 2010 10/23/19 0502 10/23/19 0919  BP: 137/83 131/73 139/85 (!) 146/99  Pulse: (!) 102 98 (!) 105 100  Resp: 18 18 18  18  Temp: 98.4 F (36.9 C)  98.2 F (36.8 C) 100.3 F (37.9 C)  TempSrc: Oral   Oral  SpO2: 92% 100% 94% 94%  Weight:      Height:        General: Alert awake, not in obvious distress, on room air HENT: pupils equally reacting to light,  No scleral pallor or icterus noted. Oral mucosa is moist.  Chest:  .  Diminished breath sounds bilaterally.  No wheezes noted CVS: S1 &S2  heard. No murmur.  Regular rate and rhythm. Abdomen: Soft, nontender, nondistended.  Bowel sounds are heard.   Extremities: No cyanosis, clubbing or edema.  Peripheral pulses are palpable.  Pruritic nodules over the lower extremities.  Left upper extremity AV fistula with bruit Psych: Alert, awake and oriented, normal mood CNS:  No cranial nerve deficits.  Power equal in all extremities.   Skin: Warm and dry.  No rashes noted.  The results of significant diagnostics from this hospitalization (including imaging, microbiology, ancillary and laboratory) are listed below for reference.     Diagnostic Studies:   DG Chest Portable 1 View  Result Date: 10/22/2019 CLINICAL DATA:  Initial evaluation for acute shortness of breath. EXAM: PORTABLE CHEST 1 VIEW COMPARISON:  Prior radiograph from 10/05/2019. FINDINGS: Cardiomegaly, stable. Mediastinal silhouette within normal limits. Aortic atherosclerosis. Lungs normally inflated. Diffuse vascular congestion with interstitial prominence, suggesting pulmonary interstitial edema. No consolidative opacity. No pleural effusion. No pneumothorax. No acute osseous finding. IMPRESSION: 1. Cardiomegaly with diffuse pulmonary vascular congestion and interstitial prominence, suggesting pulmonary interstitial edema. 2.  Aortic Atherosclerosis (ICD10-I70.0). Electronically Signed   By: Jeannine Boga M.D.   On: 10/22/2019 04:26    Labs:   Basic Metabolic Panel: Recent Labs  Lab 10/22/19 0358 10/22/19 0928 10/23/19 0610  NA 140  --  141  K 3.5  --  3.8  CL 97*  --  98  CO2 29  --  28  GLUCOSE 104*  --  119*  BUN 20  --  23*  CREATININE 5.97* 6.86* 5.94*  CALCIUM 8.0*  --  8.4*  MG  --   --  2.2   GFR Estimated Creatinine Clearance: 12.2 mL/min (A) (by C-G formula based on SCr of 5.94 mg/dL (H)). Liver Function Tests: Recent Labs  Lab 10/23/19 0610  AST 24  ALT 23  ALKPHOS 192*  BILITOT 2.0*  PROT 7.3  ALBUMIN 3.6   No results for input(s):  LIPASE, AMYLASE in the last 168 hours. No results for input(s): AMMONIA in the last 168 hours. Coagulation profile No results for input(s): INR, PROTIME in the last 168 hours.  CBC: Recent Labs  Lab 10/22/19 0358 10/22/19 1818 10/23/19 0610  WBC 9.9  --  9.9  NEUTROABS 7.8*  --   --   HGB 7.4* 8.5* 9.6*  HCT 24.5* 26.7* 30.5*  MCV 115.6*  --  110.5*  PLT 146*  --  137*   Cardiac Enzymes: No results for input(s): CKTOTAL, CKMB, CKMBINDEX, TROPONINI in the last 168 hours. BNP: Invalid input(s): POCBNP CBG: No results for input(s): GLUCAP in the last 168 hours. D-Dimer No results for input(s): DDIMER in the last 72 hours. Hgb A1c No results for input(s): HGBA1C in the last 72 hours. Lipid Profile No results for input(s): CHOL, HDL, LDLCALC, TRIG, CHOLHDL, LDLDIRECT in the last 72 hours. Thyroid function studies No results for input(s): TSH, T4TOTAL, T3FREE, THYROIDAB in the last 72 hours.  Invalid input(s): FREET3 Anemia work up No results  for input(s): VITAMINB12, FOLATE, FERRITIN, TIBC, IRON, RETICCTPCT in the last 72 hours. Microbiology Recent Results (from the past 240 hour(s))  Respiratory Panel by RT PCR (Flu A&B, Covid) - Nasopharyngeal Swab     Status: None   Collection Time: 10/22/19  5:24 AM   Specimen: Nasopharyngeal Swab  Result Value Ref Range Status   SARS Coronavirus 2 by RT PCR NEGATIVE NEGATIVE Final    Comment: (NOTE) SARS-CoV-2 target nucleic acids are NOT DETECTED. The SARS-CoV-2 RNA is generally detectable in upper respiratoy specimens during the acute phase of infection. The lowest concentration of SARS-CoV-2 viral copies this assay can detect is 131 copies/mL. A negative result does not preclude SARS-Cov-2 infection and should not be used as the sole basis for treatment or other patient management decisions. A negative result may occur with  improper specimen collection/handling, submission of specimen other than nasopharyngeal swab, presence of  viral mutation(s) within the areas targeted by this assay, and inadequate number of viral copies (<131 copies/mL). A negative result must be combined with clinical observations, patient history, and epidemiological information. The expected result is Negative. Fact Sheet for Patients:  PinkCheek.be Fact Sheet for Healthcare Providers:  GravelBags.it This test is not yet ap proved or cleared by the [place] FDA and  has been authorized for detection and/or diagnosis of SARS-CoV-2 by FDA under an Emergency Use Authorization (EUA). This EUA will remain  in effect (meaning this test can be used) for the duration of the COVID-19 declaration under Section 564(b)(1) of the Act, 21 U.S.C. section 360bbb-3(b)(1), unless the authorization is terminated or revoked sooner.    Influenza A by PCR NEGATIVE NEGATIVE Final   Influenza B by PCR NEGATIVE NEGATIVE Final    Comment: (NOTE) The Xpert Xpress SARS-CoV-2/FLU/RSV assay is intended as an aid in  the diagnosis of influenza from Nasopharyngeal swab specimens and  should not be used as a sole basis for treatment. Nasal washings and  aspirates are unacceptable for Xpert Xpress SARS-CoV-2/FLU/RSV  testing. Fact Sheet for Patients: PinkCheek.be Fact Sheet for Healthcare Providers: GravelBags.it This test is not yet approved or cleared by the [place] FDA and  has been authorized for detection and/or diagnosis of SARS-CoV-2 by  FDA under an Emergency Use Authorization (EUA). This EUA will remain  in effect (meaning this test can be used) for the duration of the  Covid-19 declaration under Section 564(b)(1) of the Act, 21  U.S.C. section 360bbb-3(b)(1), unless the authorization is  terminated or revoked. Performed at [facility], [place] [address] [place], [place] [address]      Discharge Instructions:   Discharge  Instructions    Call MD for:  temperature >100.4   Complete by: As directed    Diet - low sodium heart healthy   Complete by: As directed    Discharge instructions   Complete by: As directed    Follow-up with your primary care patient in 1 week.  Continue dialysis as scheduled.  Please seek medical attention if you persist to have fever or feel sicker.  Apply cream over the itchy areas in the leg.   Increase activity slowly   Complete by: As directed      Allergies as of 10/23/2019   No Known Allergies     Medication List    TAKE these medications   albuterol 108 (90 Base) MCG/ACT inhaler Commonly known as: VENTOLIN HFA Inhale 2 puffs into the lungs every 4 (four) hours as needed for wheezing or  shortness of breath.   calcitRIOL 0.5 MCG capsule Commonly known as: ROCALTROL Take 1 capsule (0.5 mcg total) by mouth every Monday, Wednesday, and Friday with hemodialysis. Start taking on: October 24, 2019   calcium acetate 667 MG capsule Commonly known as: PHOSLO Take 2 capsules (1,334 mg total) by mouth 3 (three) times daily with meals.   gabapentin 100 MG capsule Commonly known as: NEURONTIN Take 1 capsule (100 mg total) by mouth at bedtime.   hydrOXYzine 50 MG tablet Commonly known as: ATARAX/VISTARIL Take 50 mg by mouth 3 (three) times daily.   nicotine 14 mg/24hr patch Commonly known as: NICODERM CQ - dosed in mg/24 hours Place 1 patch (14 mg total) onto the skin daily.   sildenafil 50 MG tablet Commonly known as: VIAGRA Take 50 mg by mouth daily as needed for erectile dysfunction.   triamcinolone 0.025 % ointment Commonly known as: KENALOG Apply 1 application topically 2 (two) times daily.         Time coordinating discharge: 39 minutes  Signed:  Justyna Timoney  [facility] 10/23/2019, 1:14 PM

## 2019-10-23 NOTE — Care Management (Signed)
Unable to reach patient to do [place] letter.

## 2019-10-23 NOTE — Progress Notes (Signed)
[facility] Progress Note   Dialysis Orders: [facility] MWF   4h  68kg   2/2 bath  Hep none  LUA AVF  - mircera 225 last 1/27  - venofer 100mg  x 3 last 1/29 for tsat 15%  - parsa 5  - calci 0.5 tiw  - last Hb 6.8 and trending down  Assessment/Plan: 1. pulmonary edema - net UF 3 L 1/30 with post wt 68.6  2. ESRD -MWF - HD Monday if not d/c 3. Anemia - hgb 9.6 after transfusion 1 PRBC -  4. Secondary hyperparathyroidism - binders -  5. HTN/volume - no  BP drop or cramping yesterday - not quite to EDW - it is possible EDW could go down some more 6. Nutrition - renal diet with fluid restriction 7. Fever - had elevated temp yesterday before transfusion and and again this am - does not appear sick - Covid influenza A/B negative 8.  Dialysis noncompliance   Myriam Jacobson, PA-C [facility] [phone] 10/23/2019,9:21 AM  LOS: 0 days   Subjective:   Denies any chills, cough. Wants to know when he can go home.  Objective Vitals:   10/22/19 1729 10/22/19 2010 10/23/19 0502 10/23/19 0919  BP: 137/83 131/73 139/85 (!) 146/99  Pulse: (!) 102 98 (!) 105 100  Resp: 18 18 18 18   Temp: 98.4 F (36.9 C)  98.2 F (36.8 C) 100.3 F (37.9 C)  TempSrc: Oral   Oral  SpO2: 92% 100% 94% 94%  Weight:      Height:       Physical Exam General: breathing easily on room air NAD Heart: RRR Lungs: few crackles at bases Abdomen: soft NT Extremities: no sig LE edema Dialysis Access:  Left upper AVF + bruit   Additional Objective Labs: Basic Metabolic Panel: Recent Labs  Lab 10/22/19 0358 10/22/19 0928 10/23/19 0610  NA 140  --  141  K 3.5  --  3.8  CL 97*  --  98  CO2 29  --  28  GLUCOSE 104*  --  119*  BUN 20  --  23*  CREATININE 5.97* 6.86* 5.94*  CALCIUM 8.0*  --  8.4*   Liver Function Tests: Recent Labs  Lab 10/23/19 0610  AST 24  ALT 23  ALKPHOS 192*  BILITOT 2.0*  PROT 7.3  ALBUMIN 3.6   No results for input(s): LIPASE,  AMYLASE in the last 168 hours. CBC: Recent Labs  Lab 10/22/19 0358 10/22/19 1818 10/23/19 0610  WBC 9.9  --  9.9  NEUTROABS 7.8*  --   --   HGB 7.4* 8.5* 9.6*  HCT 24.5* 26.7* 30.5*  MCV 115.6*  --  110.5*  PLT 146*  --  137*   Blood Culture    Component Value Date/Time   SDES BLOOD 02/11/2019 1354   SPECREQUEST  02/11/2019 1354    BOTTLES DRAWN AEROBIC AND ANAEROBIC Blood Culture results may not be optimal due to an inadequate volume of blood received in culture bottles   CULT  02/11/2019 1354    NO GROWTH 5 DAYS Performed at [facility], [address] [place], [place] [address]    REPTSTATUS 02/16/2019 FINAL 02/11/2019 1354    Cardiac Enzymes: No results for input(s): CKTOTAL, CKMB, CKMBINDEX, TROPONINI in the last 168 hours. CBG: No results for input(s): GLUCAP in the last 168 hours. Iron Studies: No results for input(s): IRON, TIBC, TRANSFERRIN, FERRITIN in the last 72 hours. Lab Results  Component Value Date   INR 1.4 (H) 10/05/2019   INR 1.0 07/22/2019   INR 1.4 (H) 02/11/2019   Studies/Results: DG Chest Portable 1 View  Result Date: 10/22/2019 CLINICAL DATA:  Initial evaluation for acute shortness of breath. EXAM: PORTABLE CHEST 1 VIEW COMPARISON:  Prior radiograph from 10/05/2019. FINDINGS: Cardiomegaly, stable. Mediastinal silhouette within normal limits. Aortic atherosclerosis. Lungs normally inflated. Diffuse vascular congestion with interstitial prominence, suggesting pulmonary interstitial edema. No consolidative opacity. No pleural effusion. No pneumothorax. No acute osseous finding. IMPRESSION: 1. Cardiomegaly with diffuse pulmonary vascular congestion and interstitial prominence, suggesting pulmonary interstitial edema. 2.  Aortic Atherosclerosis (ICD10-I70.0). Electronically Signed   By: Jeannine Boga M.D.   On: 10/22/2019 04:26   Medications:  . sodium chloride   Intravenous Once  . calcium acetate  1,334 mg Oral TID WC  .  Chlorhexidine Gluconate Cloth  6 each Topical Q0600  . gabapentin  100 mg Oral QHS  . heparin  5,000 Units Subcutaneous Q8H  . nicotine  14 mg Transdermal Daily  . sodium chloride flush  3 mL Intravenous Q12H

## 2019-10-23 NOTE — Progress Notes (Signed)
DISCHARGE NOTE HOME KEADEN WITTER to be discharged Home per MD order. Discussed prescriptions and follow up appointments with the patient. Prescriptions given to patient; medication list explained in detail. Patient verbalized understanding.  Skin clean, dry and intact without evidence of skin break down, no evidence of skin tears noted. IV catheter discontinued intact. Site without signs and symptoms of complications. Dressing and pressure applied. Pt denies pain at the site currently. No complaints noted.  Patient free of lines, drains, and wounds.   An After Visit Summary (AVS) was printed and given to the patient. Patient escorted via wheelchair, and discharged home via private auto.  Arlyss Repress, RN

## 2019-10-24 ENCOUNTER — Telehealth: Payer: Self-pay | Admitting: Nephrology

## 2019-10-24 LAB — TYPE AND SCREEN
ABO/RH(D): O NEG
Antibody Screen: POSITIVE
DAT, IgG: NEGATIVE
Donor AG Type: NEGATIVE
Donor AG Type: NEGATIVE
Unit division: 0
Unit division: 0

## 2019-10-24 LAB — BPAM RBC
Blood Product Expiration Date: 202102192359
Blood Product Expiration Date: 202102212359
ISSUE DATE / TIME: 202101301506
Unit Type and Rh: 9500
Unit Type and Rh: 9500

## 2019-10-24 LAB — HEPATITIS B E ANTIGEN: Hep B E Ag: NEGATIVE

## 2019-10-24 LAB — HEPATITIS B SURFACE ANTIBODY, QUANTITATIVE: Hep B S AB Quant (Post): >1000 m[IU]/mL (ref >9.9)

## 2019-10-24 NOTE — Telephone Encounter (Signed)
Transition   Of Care Contact from Inpatient Facility  Date of Discharge 10/23/2019 Date of  Admit        10/22/2019 Method of Contact  Phone Talked with Patient    Patient contacted to discuss transition of care from recent hospitalization . Patient was  Admitted to [facility] from  10/22/2019 with Diagnosis of Acute Hypoxic Respiratory Failure from  volume  overload / pulmonary vascular congestion.  Symptoms resolved with dialysis  .     Medications were reviewed and he stated he had all med's and no questions. He missed his OP HD today and would not tell me his reason  But plans to go tomorrow for op HD .   He has no questions about his discharge instructions       Ernest Haber PA-C  [facility]  Pager  [phone]

## 2019-10-25 ENCOUNTER — Inpatient Hospital Stay: Payer: Medicare Other

## 2019-10-25 ENCOUNTER — Inpatient Hospital Stay: Payer: Medicare Other | Attending: Oncology | Admitting: Oncology

## 2019-10-25 ENCOUNTER — Other Ambulatory Visit: Payer: Self-pay

## 2019-10-25 ENCOUNTER — Encounter: Payer: Self-pay | Admitting: Oncology

## 2019-10-25 VITALS — BP 151/92 | HR 101 | Temp 96.5°F | Ht 64.0 in | Wt 158.0 lb

## 2019-10-25 DIAGNOSIS — D649 Anemia, unspecified: Secondary | ICD-10-CM

## 2019-10-25 DIAGNOSIS — I120 Hypertensive chronic kidney disease with stage 5 chronic kidney disease or end stage renal disease: Secondary | ICD-10-CM | POA: Diagnosis not present

## 2019-10-25 DIAGNOSIS — F17210 Nicotine dependence, cigarettes, uncomplicated: Secondary | ICD-10-CM | POA: Diagnosis not present

## 2019-10-25 DIAGNOSIS — D631 Anemia in chronic kidney disease: Secondary | ICD-10-CM | POA: Insufficient documentation

## 2019-10-25 DIAGNOSIS — N186 End stage renal disease: Secondary | ICD-10-CM | POA: Insufficient documentation

## 2019-10-25 DIAGNOSIS — N189 Chronic kidney disease, unspecified: Secondary | ICD-10-CM

## 2019-10-25 LAB — CBC WITH DIFFERENTIAL/PLATELET
Abs Immature Granulocytes: 0.11 10*3/uL — ABNORMAL HIGH (ref 0.00–0.07)
Basophils Absolute: 0 10*3/uL (ref 0.0–0.1)
Basophils Relative: 1 %
Eosinophils Absolute: 0.4 10*3/uL (ref 0.0–0.5)
Eosinophils Relative: 5 %
HCT: 30.4 % — ABNORMAL LOW (ref 39.0–52.0)
Hemoglobin: 9 g/dL — ABNORMAL LOW (ref 13.0–17.0)
Immature Granulocytes: 1 %
Lymphocytes Relative: 9 %
Lymphs Abs: 0.8 10*3/uL (ref 0.7–4.0)
MCH: 34 pg (ref 26.0–34.0)
MCHC: 29.6 g/dL — ABNORMAL LOW (ref 30.0–36.0)
MCV: 114.7 fL — ABNORMAL HIGH (ref 80.0–100.0)
Monocytes Absolute: 0.6 10*3/uL (ref 0.1–1.0)
Monocytes Relative: 7 %
Neutro Abs: 6.4 10*3/uL (ref 1.7–7.7)
Neutrophils Relative %: 77 %
Platelets: 127 10*3/uL — ABNORMAL LOW (ref 150–400)
RBC: 2.65 MIL/uL — ABNORMAL LOW (ref 4.22–5.81)
RDW: 21.6 % — ABNORMAL HIGH (ref 11.5–15.5)
WBC: 8.3 10*3/uL (ref 4.0–10.5)
nRBC: 0.6 % — ABNORMAL HIGH (ref 0.0–0.2)

## 2019-10-25 LAB — TSH: TSH: 0.802 u[IU]/mL (ref 0.350–4.500)

## 2019-10-25 LAB — COMPREHENSIVE METABOLIC PANEL
ALT: 19 U/L (ref 0–44)
AST: 19 U/L (ref 15–41)
Albumin: 3.7 g/dL (ref 3.5–5.0)
Alkaline Phosphatase: 176 U/L — ABNORMAL HIGH (ref 38–126)
Anion gap: 19 — ABNORMAL HIGH (ref 5–15)
BUN: 74 mg/dL — ABNORMAL HIGH (ref 6–20)
CO2: 22 mmol/L (ref 22–32)
Calcium: 8.6 mg/dL — ABNORMAL LOW (ref 8.9–10.3)
Chloride: 97 mmol/L — ABNORMAL LOW (ref 98–111)
Creatinine, Ser: 12.17 mg/dL — ABNORMAL HIGH (ref 0.61–1.24)
GFR calc Af Amer: 5 mL/min — ABNORMAL LOW (ref >60)
GFR calc non Af Amer: 4 mL/min — ABNORMAL LOW (ref >60)
Glucose, Bld: 101 mg/dL — ABNORMAL HIGH (ref 70–99)
Potassium: 4.4 mmol/L (ref 3.5–5.1)
Sodium: 138 mmol/L (ref 135–145)
Total Bilirubin: 1.5 mg/dL — ABNORMAL HIGH (ref 0.3–1.2)
Total Protein: 7.5 g/dL (ref 6.5–8.1)

## 2019-10-25 LAB — IRON AND TIBC
Iron: 205 ug/dL — ABNORMAL HIGH (ref 45–182)
Saturation Ratios: 53 % — ABNORMAL HIGH (ref 17.9–39.5)
TIBC: 385 ug/dL (ref 250–450)
UIBC: 180 ug/dL

## 2019-10-25 LAB — VITAMIN B12: Vitamin B-12: 435 pg/mL (ref 180–914)

## 2019-10-25 LAB — FOLATE: Folate: 18.1 ng/mL (ref >5.9)

## 2019-10-25 LAB — RETICULOCYTES
Immature Retic Fract: 30.9 % — ABNORMAL HIGH (ref 2.3–15.9)
RBC.: 2.65 MIL/uL — ABNORMAL LOW (ref 4.22–5.81)
Retic Count, Absolute: 328.3 10*3/uL — ABNORMAL HIGH (ref 19.0–186.0)
Retic Ct Pct: 12.4 % — ABNORMAL HIGH (ref 0.4–3.1)

## 2019-10-25 LAB — FERRITIN: Ferritin: 716 ng/mL — ABNORMAL HIGH (ref 24–336)

## 2019-10-25 LAB — PATHOLOGIST SMEAR REVIEW

## 2019-10-25 NOTE — Progress Notes (Signed)
Patient is here to establish care. Patient is currently on dialysis (Mondays, Wednesdays, Fridays).

## 2019-10-26 DIAGNOSIS — N186 End stage renal disease: Secondary | ICD-10-CM | POA: Diagnosis not present

## 2019-10-26 DIAGNOSIS — Z992 Dependence on renal dialysis: Secondary | ICD-10-CM | POA: Diagnosis not present

## 2019-10-26 DIAGNOSIS — L299 Pruritus, unspecified: Secondary | ICD-10-CM | POA: Diagnosis not present

## 2019-10-26 DIAGNOSIS — D631 Anemia in chronic kidney disease: Secondary | ICD-10-CM | POA: Diagnosis not present

## 2019-10-26 DIAGNOSIS — N2581 Secondary hyperparathyroidism of renal origin: Secondary | ICD-10-CM | POA: Diagnosis not present

## 2019-10-26 DIAGNOSIS — R52 Pain, unspecified: Secondary | ICD-10-CM | POA: Diagnosis not present

## 2019-10-26 LAB — HAPTOGLOBIN: Haptoglobin: 27 mg/dL — ABNORMAL LOW (ref 29–370)

## 2019-10-26 LAB — KAPPA/LAMBDA LIGHT CHAINS
Kappa free light chain: 269.6 mg/L — ABNORMAL HIGH (ref 3.3–19.4)
Kappa, lambda light chain ratio: 1.01 (ref 0.26–1.65)
Lambda free light chains: 265.8 mg/L — ABNORMAL HIGH (ref 5.7–26.3)

## 2019-10-27 LAB — MULTIPLE MYELOMA PANEL, SERUM
Albumin SerPl Elph-Mcnc: 4 g/dL (ref 2.9–4.4)
Albumin/Glob SerPl: 1.5 (ref 0.7–1.7)
Alpha 1: 0.3 g/dL (ref 0.0–0.4)
Alpha2 Glob SerPl Elph-Mcnc: 0.6 g/dL (ref 0.4–1.0)
B-Globulin SerPl Elph-Mcnc: 1.1 g/dL (ref 0.7–1.3)
Gamma Glob SerPl Elph-Mcnc: 0.9 g/dL (ref 0.4–1.8)
Globulin, Total: 2.8 g/dL (ref 2.2–3.9)
IgA: 237 mg/dL (ref 90–386)
IgG (Immunoglobin G), Serum: 1078 mg/dL (ref 603–1613)
IgM (Immunoglobulin M), Srm: 61 mg/dL (ref 20–172)
Total Protein ELP: 6.8 g/dL (ref 6.0–8.5)

## 2019-10-27 LAB — CULTURE, BLOOD (ROUTINE X 2)
Culture: NO GROWTH
Culture: NO GROWTH
Special Requests: ADEQUATE
Special Requests: ADEQUATE

## 2019-10-28 ENCOUNTER — Encounter: Payer: Self-pay | Admitting: Oncology

## 2019-10-28 LAB — CULTURE, BLOOD (ROUTINE X 2)
Culture: NO GROWTH
Culture: NO GROWTH
Special Requests: ADEQUATE

## 2019-10-28 NOTE — Progress Notes (Signed)
Hematology/Oncology Consult note [facility] [phone] [phone]  Patient Care Team: Jearld Fenton, NP as PCP - General (Internal Medicine) Center, [facility]   Name of the patient: Jordan [name]  [id number]  1966-04-19    Reason for referral- anemia   Referring physician- Dr. Posey Pronto  Date of visit: 10/28/19   History of presenting illness- Patient is a 54 year old African-American male with a past medical history significant for end-stage renal disease on dialysis.  He sees Dr. Posey Pronto from [facility].  He has been referred to [place] for anemia.  Patient has been receiving IV iron as well as Mircera through nephrology and the last dose was given on 113 2021 to 25 mcg.  He was noted to have a hemoglobin of 7.9 in November 2020.  Most recently patient was admitted to the hospital in January for shortness of breath and fluid overload due to noncompliance with dialysis.  He received dialysis in the ER and was discharged.  He also received 1 unit of PRBC.  His hemoglobin at that point improved from 7.4-9.6.  Patient reports ongoing fatigue but denies other complaints.  Denies any blood in his stool or urine.  ECOG PS- 1  Pain scale- 0   Review of systems- Review of Systems  Constitutional: Positive for malaise/fatigue. Negative for chills, fever and weight loss.  HENT: Negative for congestion, ear discharge and nosebleeds.   Eyes: Negative for blurred vision.  Respiratory: Negative for cough, hemoptysis, sputum production, shortness of breath and wheezing.   Cardiovascular: Negative for chest pain, palpitations, orthopnea and claudication.  Gastrointestinal: Negative for abdominal pain, blood in stool, constipation, diarrhea, heartburn, melena, nausea and vomiting.  Genitourinary: Negative for dysuria, flank pain, frequency, hematuria and urgency.  Musculoskeletal: Negative for back pain, joint pain and myalgias.  Skin:  Negative for rash.  Neurological: Negative for dizziness, tingling, focal weakness, seizures, weakness and headaches.  Endo/Heme/Allergies: Does not bruise/bleed easily.  Psychiatric/Behavioral: Negative for depression and suicidal ideas. The patient does not have insomnia.     No Known Allergies  Patient Active Problem List   Diagnosis Date Noted  . Acute on chronic congestive heart failure ([place]) 10/22/2019  . Acute respiratory distress 10/06/2019  . Volume overload 10/05/2019  . Hypokalemia 10/05/2019  . ESRD (end stage renal disease) ([place]) 10/01/2019  . Leg mass, right   . Hyperbilirubinemia   . Prolonged QT interval 09/19/2019  . Symptomatic anemia 09/19/2019  . Pruritus 09/19/2019  . Hypercalcemia 01/19/2019  . Anemia in ESRD (end-stage renal disease) ([place]) 05/17/2018  . Hepatitis B 03/19/2018  . ESRD on dialysis ([place]) 05/26/2017  . Essential hypertension 05/26/2017  . Urinary tract infection, site not specified 10/17/2016  . Coagulation defect, unspecified ([place]) 08/13/2016  . Diarrhea, unspecified 08/13/2016  . Encounter for immunization 08/13/2016  . Headache, unspecified 08/13/2016  . Hypertensive chronic kidney disease with stage 5 chronic kidney disease or end stage renal disease ([place]) 08/13/2016  . Iron deficiency anemia, unspecified 08/13/2016  . Pain, unspecified 08/13/2016  . Moderate protein-calorie malnutrition ([place]) 08/13/2016  . Secondary hyperparathyroidism of renal origin ([place]) 08/13/2016     Past Medical History:  Diagnosis Date  . Anemia   . CKD (chronic kidney disease)    Stage 5  Dialysis - M/W/F in [place], [place]  . Dyspnea    tx with inhaler when sick  . ED (erectile dysfunction)   . ETOH abuse   . History of blood transfusion   . Hypertension   .  Wears dentures      Past Surgical History:  Procedure Laterality Date  . BASCILIC VEIN TRANSPOSITION Left 08/07/2016   Procedure: LEFT BASILIC VEIN TRANSPOSITION;  Surgeon: Angelia Mould, MD;  Location: [place];  Service: Vascular;  Laterality: Left;  . CLOSED REDUCTION NASAL FRACTURE N/A 08/11/2019   Procedure: CLOSED REDUCTION NASAL FRACTURE WITH STABILIZATION;  Surgeon: Irene Limbo, MD;  Location: [place];  Service: Plastics;  Laterality: N/A;  . COLONOSCOPY N/A 08/12/2016   Procedure: COLONOSCOPY;  Surgeon: Otis Brace, MD;  Location: [place];  Service: Gastroenterology;  Laterality: N/A;  . ESOPHAGOGASTRODUODENOSCOPY N/A 08/12/2016   Procedure: ESOPHAGOGASTRODUODENOSCOPY (EGD);  Surgeon: Otis Brace, MD;  Location: [place];  Service: Gastroenterology;  Laterality: N/A;  . INSERTION OF DIALYSIS CATHETER N/A 08/07/2016   Procedure: INSERTION OF TUNNELED DIALYSIS CATHETER;  Surgeon: Angelia Mould, MD;  Location: [place];  Service: Vascular;  Laterality: N/A;  . LIGATION OF ARTERIOVENOUS  FISTULA Left 09/12/2016   Procedure: BANDING OF LEFT  ARTERIOVENOUS  FISTULA;  Surgeon: Angelia Mould, MD;  Location: [place];  Service: Vascular;  Laterality: Left;  . LOWER EXTREMITY INTERVENTION Right 12/02/2018   Procedure: LOWER EXTREMITY INTERVENTION;  Surgeon: Algernon Huxley, MD;  Location: [facility];  Service: Cardiovascular;  Laterality: Right;    Social History   Socioeconomic History  . Marital status: Single    Spouse name: Not on file  . Number of children: Not on file  . Years of education: Not on file  . Highest education level: Not on file  Occupational History  . Not on file  Tobacco Use  . Smoking status: Current Every Day Smoker    Packs/day: 0.50    Years: 40.00    Pack years: 20.00    Types: Cigarettes  . Smokeless tobacco: Never Used  Substance and Sexual Activity  . Alcohol use: Yes    Alcohol/week: 21.0 standard drinks    Types: 21 Cans of beer per week    Comment: none since 08/04/16  . Drug use: No  . Sexual activity: Not on file  Other Topics Concern  . Not on file  Social History Narrative  . Not  on file   Social Determinants of Health   Financial Resource Strain:   . Difficulty of Paying Living Expenses: Not on file  Food Insecurity:   . Worried About Charity fundraiser in the Last Year: Not on file  . Ran Out of Food in the Last Year: Not on file  Transportation Needs:   . Lack of Transportation (Medical): Not on file  . Lack of Transportation (Non-Medical): Not on file  Physical Activity:   . Days of Exercise per Week: Not on file  . Minutes of Exercise per Session: Not on file  Stress:   . Feeling of Stress : Not on file  Social Connections:   . Frequency of Communication with Friends and Family: Not on file  . Frequency of Social Gatherings with Friends and Family: Not on file  . Attends Religious Services: Not on file  . Active Member of Clubs or Organizations: Not on file  . Attends Archivist Meetings: Not on file  . Marital Status: Not on file  Intimate Partner Violence:   . Fear of Current or Ex-Partner: Not on file  . Emotionally Abused: Not on file  . Physically Abused: Not on file  . Sexually Abused: Not on file     Family History  Problem Relation Age of Onset  . Diabetes Mother   . Kidney failure Mother   . Healthy Father   . Kidney failure Brother   . Healthy Sister   . Kidney disease Daughter   . Post-traumatic stress disorder Neg Hx   . Bladder Cancer Neg Hx   . Kidney cancer Neg Hx      Current Outpatient Medications:  .  albuterol (PROVENTIL HFA;VENTOLIN HFA) 108 (90 Base) MCG/ACT inhaler, Inhale 2 puffs into the lungs every 4 (four) hours as needed for wheezing or shortness of breath., Disp: 1 Inhaler, Rfl: 2 .  calcitRIOL (ROCALTROL) 0.5 MCG capsule, Take 1 capsule (0.5 mcg total) by mouth every Monday, Wednesday, and Friday with hemodialysis., Disp: 30 capsule, Rfl: 0 .  calcium acetate (PHOSLO) 667 MG capsule, Take 2 capsules (1,334 mg total) by mouth 3 (three) times daily with meals., Disp: 90 capsule, Rfl: 2 .  hydrOXYzine  (ATARAX/VISTARIL) 50 MG tablet, Take 50 mg by mouth 3 (three) times daily., Disp: , Rfl:  .  nicotine (NICODERM CQ - DOSED IN MG/24 HOURS) 14 mg/24hr patch, Place 1 patch (14 mg total) onto the skin daily., Disp: 28 patch, Rfl: 0 .  triamcinolone (KENALOG) 0.025 % ointment, Apply 1 application topically 2 (two) times daily., Disp: 30 g, Rfl: 2 .  sildenafil (VIAGRA) 50 MG tablet, Take 50 mg by mouth daily as needed for erectile dysfunction., Disp: , Rfl:    Physical exam:  Vitals:   10/25/19 1009  BP: (!) 151/92  Pulse: (!) 101  Temp: (!) 96.5 F (35.8 C)  TempSrc: Tympanic  SpO2: 99%  Weight: 158 lb (71.7 kg)  Height: 5' 4"  (1.626 m)   Physical Exam HENT:     Head: Normocephalic and atraumatic.  Eyes:     Pupils: Pupils are equal, round, and reactive to light.  Cardiovascular:     Rate and Rhythm: Normal rate and regular rhythm.     Heart sounds: Normal heart sounds.  Pulmonary:     Effort: Pulmonary effort is normal.     Breath sounds: Normal breath sounds.  Abdominal:     General: Bowel sounds are normal.     Palpations: Abdomen is soft.  Musculoskeletal:     Cervical back: Normal range of motion.  Skin:    General: Skin is warm and dry.  Neurological:     Mental Status: He is alert and oriented to person, place, and time.        CMP Latest Ref Rng & Units 10/25/2019  Glucose 70 - 99 mg/dL 101(H)  BUN 6 - 20 mg/dL 74(H)  Creatinine 0.61 - 1.24 mg/dL 12.17(H)  Sodium 135 - 145 mmol/L 138  Potassium 3.5 - 5.1 mmol/L 4.4  Chloride 98 - 111 mmol/L 97(L)  CO2 22 - 32 mmol/L 22  Calcium 8.9 - 10.3 mg/dL 8.6(L)  Total Protein 6.5 - 8.1 g/dL 7.5  Total Bilirubin 0.3 - 1.2 mg/dL 1.5(H)  Alkaline Phos 38 - 126 U/L 176(H)  AST 15 - 41 U/L 19  ALT 0 - 44 U/L 19   CBC Latest Ref Rng & Units 10/25/2019  WBC 4.0 - 10.5 K/uL 8.3  Hemoglobin 13.0 - 17.0 g/dL 9.0(L)  Hematocrit 39.0 - 52.0 % 30.4(L)  Platelets 150 - 400 K/uL 127(L)    No images are attached to the  encounter.  DG Chest Portable 1 View  Result Date: 10/22/2019 CLINICAL DATA:  Initial evaluation for acute shortness of breath. EXAM: PORTABLE CHEST  1 VIEW COMPARISON:  Prior radiograph from 10/05/2019. FINDINGS: Cardiomegaly, stable. Mediastinal silhouette within normal limits. Aortic atherosclerosis. Lungs normally inflated. Diffuse vascular congestion with interstitial prominence, suggesting pulmonary interstitial edema. No consolidative opacity. No pleural effusion. No pneumothorax. No acute osseous finding. IMPRESSION: 1. Cardiomegaly with diffuse pulmonary vascular congestion and interstitial prominence, suggesting pulmonary interstitial edema. 2.  Aortic Atherosclerosis (ICD10-I70.0). Electronically Signed   By: Jeannine Boga M.D.   On: 10/22/2019 04:26   DG Chest Port 1 View  Result Date: 10/05/2019 CLINICAL DATA:  Short of breath EXAM: PORTABLE CHEST 1 VIEW COMPARISON:  10/01/2019, CT 07/22/2019, 07/22/2019, 02/18/2019 FINDINGS: Stable mild cardiomegaly. Vascular calcification. Diffuse bilateral interstitial and ground-glass opacity.aortic atherosclerosis. No pneumothorax. IMPRESSION: Cardiomegaly with vascular congestion. Diffuse bilateral interstitial and ground-glass opacity, favor pulmonary edema. Electronically Signed   By: Donavan Foil M.D.   On: 10/05/2019 16:38   DG Chest Port 1 View  Result Date: 10/01/2019 CLINICAL DATA:  Dyspnea EXAM: PORTABLE CHEST 1 VIEW COMPARISON:  07/22/2019 FINDINGS: Mild cardiomegaly. Mild interstitial opacities. No pleural effusion or pneumothorax. No focal airspace consolidation. IMPRESSION: Mild cardiomegaly and mild interstitial edema. Electronically Signed   By: Ulyses Jarred M.D.   On: 10/01/2019 04:38    Assessment and plan- Patient is a 54 y.o. male will be referred for anemia of chronic kidney disease.  Today I will do a complete anemia work-up including CBC, ferritin and iron studies, B12 and folate, myeloma panel, serum free light chains,  TSH, haptoglobin, reticulocyte count to ascertain if there is any other cause of anemia.  Patient is already getting Venofer and EPO through dialysis.  If his hemoglobin stays between 9 and 10 with IV iron and EPO I am inclined to monitor this conservatively without a bone marrow biopsy.  He is already receiving high doses of Mircera.  However if his anemia remains significant I will consider doing a bone marrow biopsy down the line   Thank you for this kind referral and the opportunity to participate in the care of this patient   Visit Diagnosis 1. Normocytic anemia   2. Anemia of chronic renal failure, unspecified CKD stage     Dr. Randa Evens, MD, MPH [facility] [phone] 10/28/2019 12:26 PM

## 2019-10-31 ENCOUNTER — Inpatient Hospital Stay (HOSPITAL_COMMUNITY)
Admission: EM | Admit: 2019-10-31 | Discharge: 2019-11-01 | DRG: 640 | Discharge status: Against Medical Advice | Payer: Medicare Other | Attending: Internal Medicine | Admitting: Internal Medicine

## 2019-10-31 ENCOUNTER — Encounter (HOSPITAL_COMMUNITY): Payer: Self-pay

## 2019-10-31 ENCOUNTER — Other Ambulatory Visit: Payer: Self-pay

## 2019-10-31 ENCOUNTER — Emergency Department (HOSPITAL_COMMUNITY): Payer: Medicare Other

## 2019-10-31 DIAGNOSIS — I120 Hypertensive chronic kidney disease with stage 5 chronic kidney disease or end stage renal disease: Secondary | ICD-10-CM | POA: Diagnosis present

## 2019-10-31 DIAGNOSIS — R042 Hemoptysis: Secondary | ICD-10-CM | POA: Diagnosis not present

## 2019-10-31 DIAGNOSIS — I10 Essential (primary) hypertension: Secondary | ICD-10-CM | POA: Diagnosis not present

## 2019-10-31 DIAGNOSIS — I160 Hypertensive urgency: Secondary | ICD-10-CM | POA: Diagnosis not present

## 2019-10-31 DIAGNOSIS — E8770 Fluid overload, unspecified: Secondary | ICD-10-CM | POA: Diagnosis not present

## 2019-10-31 DIAGNOSIS — Z79899 Other long term (current) drug therapy: Secondary | ICD-10-CM | POA: Diagnosis not present

## 2019-10-31 DIAGNOSIS — E875 Hyperkalemia: Secondary | ICD-10-CM | POA: Diagnosis present

## 2019-10-31 DIAGNOSIS — Z833 Family history of diabetes mellitus: Secondary | ICD-10-CM

## 2019-10-31 DIAGNOSIS — F1010 Alcohol abuse, uncomplicated: Secondary | ICD-10-CM | POA: Diagnosis present

## 2019-10-31 DIAGNOSIS — E8889 Other specified metabolic disorders: Secondary | ICD-10-CM | POA: Diagnosis present

## 2019-10-31 DIAGNOSIS — Z5329 Procedure and treatment not carried out because of patient's decision for other reasons: Secondary | ICD-10-CM | POA: Diagnosis not present

## 2019-10-31 DIAGNOSIS — J9601 Acute respiratory failure with hypoxia: Secondary | ICD-10-CM | POA: Diagnosis present

## 2019-10-31 DIAGNOSIS — E8779 Other fluid overload: Secondary | ICD-10-CM | POA: Diagnosis not present

## 2019-10-31 DIAGNOSIS — R079 Chest pain, unspecified: Secondary | ICD-10-CM | POA: Diagnosis not present

## 2019-10-31 DIAGNOSIS — R0602 Shortness of breath: Secondary | ICD-10-CM | POA: Diagnosis not present

## 2019-10-31 DIAGNOSIS — Z841 Family history of disorders of kidney and ureter: Secondary | ICD-10-CM | POA: Diagnosis not present

## 2019-10-31 DIAGNOSIS — N186 End stage renal disease: Secondary | ICD-10-CM | POA: Diagnosis not present

## 2019-10-31 DIAGNOSIS — R000 Tachycardia, unspecified: Secondary | ICD-10-CM | POA: Diagnosis not present

## 2019-10-31 DIAGNOSIS — L299 Pruritus, unspecified: Secondary | ICD-10-CM | POA: Diagnosis present

## 2019-10-31 DIAGNOSIS — N2581 Secondary hyperparathyroidism of renal origin: Secondary | ICD-10-CM | POA: Diagnosis present

## 2019-10-31 DIAGNOSIS — D631 Anemia in chronic kidney disease: Secondary | ICD-10-CM | POA: Diagnosis present

## 2019-10-31 DIAGNOSIS — F17210 Nicotine dependence, cigarettes, uncomplicated: Secondary | ICD-10-CM | POA: Diagnosis present

## 2019-10-31 DIAGNOSIS — J810 Acute pulmonary edema: Secondary | ICD-10-CM | POA: Diagnosis present

## 2019-10-31 DIAGNOSIS — Z20822 Contact with and (suspected) exposure to covid-19: Secondary | ICD-10-CM | POA: Diagnosis present

## 2019-10-31 DIAGNOSIS — Z9115 Patient's noncompliance with renal dialysis: Secondary | ICD-10-CM | POA: Diagnosis not present

## 2019-10-31 DIAGNOSIS — Z992 Dependence on renal dialysis: Secondary | ICD-10-CM | POA: Diagnosis not present

## 2019-10-31 LAB — CBC WITH DIFFERENTIAL/PLATELET
Abs Immature Granulocytes: 0.06 10*3/uL (ref 0.00–0.07)
Basophils Absolute: 0 10*3/uL (ref 0.0–0.1)
Basophils Relative: 0 %
Eosinophils Absolute: 0.4 10*3/uL (ref 0.0–0.5)
Eosinophils Relative: 4 %
HCT: 29.2 % — ABNORMAL LOW (ref 39.0–52.0)
Hemoglobin: 8.6 g/dL — ABNORMAL LOW (ref 13.0–17.0)
Immature Granulocytes: 1 %
Lymphocytes Relative: 6 %
Lymphs Abs: 0.6 10*3/uL — ABNORMAL LOW (ref 0.7–4.0)
MCH: 34.3 pg — ABNORMAL HIGH (ref 26.0–34.0)
MCHC: 29.5 g/dL — ABNORMAL LOW (ref 30.0–36.0)
MCV: 116.3 fL — ABNORMAL HIGH (ref 80.0–100.0)
Monocytes Absolute: 0.5 10*3/uL (ref 0.1–1.0)
Monocytes Relative: 6 %
Neutro Abs: 7.9 10*3/uL — ABNORMAL HIGH (ref 1.7–7.7)
Neutrophils Relative %: 83 %
Platelets: 128 10*3/uL — ABNORMAL LOW (ref 150–400)
RBC: 2.51 MIL/uL — ABNORMAL LOW (ref 4.22–5.81)
RDW: 18.6 % — ABNORMAL HIGH (ref 11.5–15.5)
WBC: 9.1 10*3/uL (ref 4.0–10.5)
nRBC: 0 % (ref 0.0–0.2)

## 2019-10-31 LAB — POCT I-STAT 7, (LYTES, BLD GAS, ICA,H+H)
Acid-base deficit: 4 mmol/L — ABNORMAL HIGH (ref 0.0–2.0)
Bicarbonate: 19.9 mmol/L — ABNORMAL LOW (ref 20.0–28.0)
Calcium, Ion: 1.08 mmol/L — ABNORMAL LOW (ref 1.15–1.40)
HCT: 26 % — ABNORMAL LOW (ref 39.0–52.0)
Hemoglobin: 8.8 g/dL — ABNORMAL LOW (ref 13.0–17.0)
O2 Saturation: 88 %
Patient temperature: 98.6
Potassium: 5.2 mmol/L — ABNORMAL HIGH (ref 3.5–5.1)
Sodium: 139 mmol/L (ref 135–145)
TCO2: 21 mmol/L — ABNORMAL LOW (ref 22–32)
pCO2 arterial: 30 mmHg — ABNORMAL LOW (ref 32.0–48.0)
pH, Arterial: 7.429 (ref 7.350–7.450)
pO2, Arterial: 53 mmHg — ABNORMAL LOW (ref 83.0–108.0)

## 2019-10-31 LAB — RENAL FUNCTION PANEL
Albumin: 3.2 g/dL — ABNORMAL LOW (ref 3.5–5.0)
Anion gap: 19 — ABNORMAL HIGH (ref 5–15)
BUN: 105 mg/dL — ABNORMAL HIGH (ref 6–20)
CO2: 19 mmol/L — ABNORMAL LOW (ref 22–32)
Calcium: 8.6 mg/dL — ABNORMAL LOW (ref 8.9–10.3)
Chloride: 103 mmol/L (ref 98–111)
Creatinine, Ser: 18.25 mg/dL — ABNORMAL HIGH (ref 0.61–1.24)
GFR calc Af Amer: 3 mL/min — ABNORMAL LOW (ref >60)
GFR calc non Af Amer: 3 mL/min — ABNORMAL LOW (ref >60)
Glucose, Bld: 84 mg/dL (ref 70–99)
Phosphorus: 7.1 mg/dL — ABNORMAL HIGH (ref 2.5–4.6)
Potassium: 6.1 mmol/L — ABNORMAL HIGH (ref 3.5–5.1)
Sodium: 141 mmol/L (ref 135–145)

## 2019-10-31 LAB — COMPREHENSIVE METABOLIC PANEL
ALT: 15 U/L (ref 0–44)
AST: 18 U/L (ref 15–41)
Albumin: 3.4 g/dL — ABNORMAL LOW (ref 3.5–5.0)
Alkaline Phosphatase: 146 U/L — ABNORMAL HIGH (ref 38–126)
Anion gap: 22 — ABNORMAL HIGH (ref 5–15)
BUN: 100 mg/dL — ABNORMAL HIGH (ref 6–20)
CO2: 18 mmol/L — ABNORMAL LOW (ref 22–32)
Calcium: 8.7 mg/dL — ABNORMAL LOW (ref 8.9–10.3)
Chloride: 104 mmol/L (ref 98–111)
Creatinine, Ser: 17.41 mg/dL — ABNORMAL HIGH (ref 0.61–1.24)
GFR calc Af Amer: 3 mL/min — ABNORMAL LOW (ref >60)
GFR calc non Af Amer: 3 mL/min — ABNORMAL LOW (ref >60)
Glucose, Bld: 94 mg/dL (ref 70–99)
Potassium: 5.6 mmol/L — ABNORMAL HIGH (ref 3.5–5.1)
Sodium: 144 mmol/L (ref 135–145)
Total Bilirubin: 1.8 mg/dL — ABNORMAL HIGH (ref 0.3–1.2)
Total Protein: 6.9 g/dL (ref 6.5–8.1)

## 2019-10-31 LAB — CREATININE, SERUM
Creatinine, Ser: 17.21 mg/dL — ABNORMAL HIGH (ref 0.61–1.24)
GFR calc Af Amer: 3 mL/min — ABNORMAL LOW (ref >60)
GFR calc non Af Amer: 3 mL/min — ABNORMAL LOW (ref >60)

## 2019-10-31 LAB — CBC
HCT: 24.3 % — ABNORMAL LOW (ref 39.0–52.0)
HCT: 25.1 % — ABNORMAL LOW (ref 39.0–52.0)
Hemoglobin: 7.4 g/dL — ABNORMAL LOW (ref 13.0–17.0)
Hemoglobin: 7.6 g/dL — ABNORMAL LOW (ref 13.0–17.0)
MCH: 35.1 pg — ABNORMAL HIGH (ref 26.0–34.0)
MCH: 34.4 pg — ABNORMAL HIGH (ref 26.0–34.0)
MCHC: 30.5 g/dL (ref 30.0–36.0)
MCHC: 30.3 g/dL (ref 30.0–36.0)
MCV: 115.2 fL — ABNORMAL HIGH (ref 80.0–100.0)
MCV: 113.6 fL — ABNORMAL HIGH (ref 80.0–100.0)
Platelets: 162 10*3/uL (ref 150–400)
Platelets: 124 10*3/uL — ABNORMAL LOW (ref 150–400)
RBC: 2.11 MIL/uL — ABNORMAL LOW (ref 4.22–5.81)
RBC: 2.21 MIL/uL — ABNORMAL LOW (ref 4.22–5.81)
RDW: 18.6 % — ABNORMAL HIGH (ref 11.5–15.5)
RDW: 18 % — ABNORMAL HIGH (ref 11.5–15.5)
WBC: 8.4 10*3/uL (ref 4.0–10.5)
WBC: 9 10*3/uL (ref 4.0–10.5)
nRBC: 0 % (ref 0.0–0.2)
nRBC: 0 % (ref 0.0–0.2)

## 2019-10-31 LAB — I-STAT CHEM 8, ED
BUN: 103 mg/dL — ABNORMAL HIGH (ref 6–20)
Calcium, Ion: 0.95 mmol/L — ABNORMAL LOW (ref 1.15–1.40)
Chloride: 107 mmol/L (ref 98–111)
Creatinine, Ser: >18 mg/dL — ABNORMAL HIGH (ref 0.61–1.24)
Glucose, Bld: 93 mg/dL (ref 70–99)
HCT: 26 % — ABNORMAL LOW (ref 39.0–52.0)
Hemoglobin: 8.8 g/dL — ABNORMAL LOW (ref 13.0–17.0)
Potassium: 5.3 mmol/L — ABNORMAL HIGH (ref 3.5–5.1)
Sodium: 139 mmol/L (ref 135–145)
TCO2: 22 mmol/L (ref 22–32)

## 2019-10-31 LAB — POTASSIUM: Potassium: 4 mmol/L (ref 3.5–5.1)

## 2019-10-31 LAB — TROPONIN I (HIGH SENSITIVITY): Troponin I (High Sensitivity): 104 ng/L (ref <18)

## 2019-10-31 LAB — RESPIRATORY PANEL BY RT PCR (FLU A&B, COVID)
Influenza A by PCR: NEGATIVE
Influenza B by PCR: NEGATIVE
SARS Coronavirus 2 by RT PCR: NEGATIVE

## 2019-10-31 LAB — MAGNESIUM: Magnesium: 2.1 mg/dL (ref 1.7–2.4)

## 2019-10-31 IMAGING — DX DG CHEST 1V PORT
1 series · 1 of 1 positions shown · non-contrast
Comparison: [DATE]

CLINICAL DATA: Shortness of breath and chest pain.

EXAM:
PORTABLE CHEST 1 VIEW

[chest]
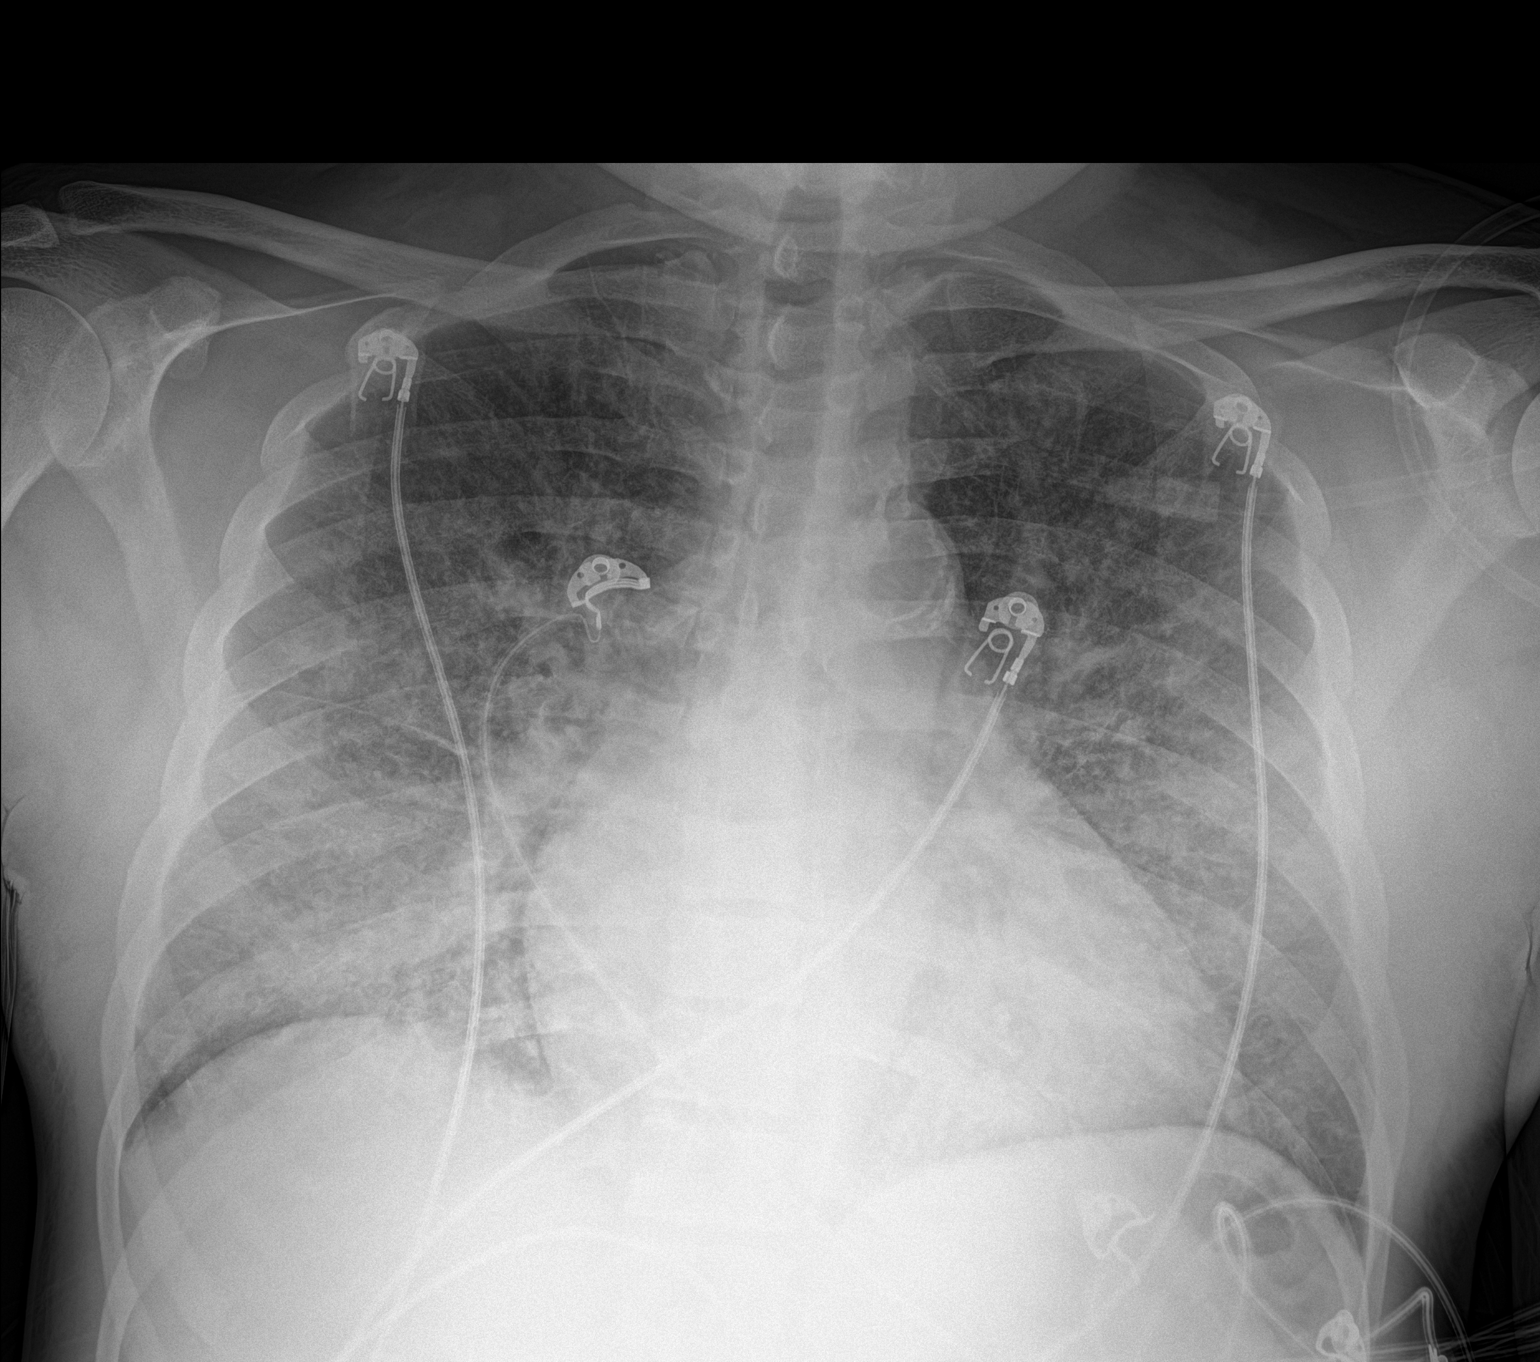

[1 of 1 positions shown; findings below may reference images not displayed]

FINDINGS: Artifact overlies the chest. The heart is enlarged. There is aortic
atherosclerosis. Widespread opacity likely reflects interstitial and
alveolar pulmonary edema. No visible effusion at this time. No acute
bone finding.
IMPRESSION: Acute pulmonary edema.

## 2019-10-31 MED ORDER — CALCIUM ACETATE (PHOS BINDER) 667 MG PO CAPS
1334.0000 mg | ORAL_CAPSULE | Freq: Three times a day (TID) | ORAL | Status: DC
  Administered 2019-10-31 – 2019-11-01 (×4): 1334 mg via ORAL
  Filled 2019-10-31 (×6): qty 2

## 2019-10-31 MED ORDER — GUAIFENESIN-DM 100-10 MG/5ML PO SYRP
5.0000 mL | ORAL_SOLUTION | ORAL | Status: DC | PRN
  Administered 2019-11-01 (×2): 5 mL via ORAL
  Filled 2019-10-31 (×2): qty 5

## 2019-10-31 MED ORDER — SODIUM CHLORIDE 0.9 % IV SOLN: 100.0000 mL | INTRAVENOUS | Status: DC | PRN

## 2019-10-31 MED ORDER — HEPARIN SODIUM (PORCINE) 5000 UNIT/ML IJ SOLN
5000.0000 [IU] | Freq: Three times a day (TID) | INTRAMUSCULAR | Status: DC
  Administered 2019-10-31 – 2019-11-01 (×2): 5000 [IU] via SUBCUTANEOUS
  Filled 2019-10-31 (×2): qty 1

## 2019-10-31 MED ORDER — CHLORHEXIDINE GLUCONATE CLOTH 2 % EX PADS: 6.0000 | MEDICATED_PAD | Freq: Every day | CUTANEOUS | Status: DC

## 2019-10-31 MED ORDER — ACETAMINOPHEN 650 MG RE SUPP: 650.0000 mg | Freq: Four times a day (QID) | RECTAL | Status: DC | PRN

## 2019-10-31 MED ORDER — HEPARIN SODIUM (PORCINE) 1000 UNIT/ML DIALYSIS: 1000.0000 [IU] | INTRAMUSCULAR | Status: DC | PRN

## 2019-10-31 MED ORDER — ALBUTEROL SULFATE (2.5 MG/3ML) 0.083% IN NEBU
5.0000 mg | INHALATION_SOLUTION | Freq: Once | RESPIRATORY_TRACT | Status: AC
  Administered 2019-10-31: 03:00:00 5 mg via RESPIRATORY_TRACT
  Filled 2019-10-31: qty 6

## 2019-10-31 MED ORDER — PENTAFLUOROPROP-TETRAFLUOROETH EX AERO: 1.0000 "application " | INHALATION_SPRAY | CUTANEOUS | Status: DC | PRN

## 2019-10-31 MED ORDER — HYDROCORTISONE 0.5 % EX CREA
TOPICAL_CREAM | Freq: Two times a day (BID) | CUTANEOUS | Status: DC
  Filled 2019-10-31: qty 28.35

## 2019-10-31 MED ORDER — ONDANSETRON HCL 4 MG PO TABS: 4.0000 mg | ORAL_TABLET | Freq: Four times a day (QID) | ORAL | Status: DC | PRN

## 2019-10-31 MED ORDER — ACETAMINOPHEN 325 MG PO TABS: 650.0000 mg | ORAL_TABLET | Freq: Four times a day (QID) | ORAL | Status: DC | PRN

## 2019-10-31 MED ORDER — CALCITRIOL 0.5 MCG PO CAPS
0.5000 ug | ORAL_CAPSULE | ORAL | Status: DC
  Administered 2019-10-31: 15:00:00 0.5 ug via ORAL
  Filled 2019-10-31: qty 1

## 2019-10-31 MED ORDER — ALTEPLASE 2 MG IJ SOLR: 2.0000 mg | Freq: Once | INTRAMUSCULAR | Status: DC | PRN

## 2019-10-31 MED ORDER — HYDROCORTISONE 1 % EX CREA
TOPICAL_CREAM | Freq: Two times a day (BID) | CUTANEOUS | Status: DC
  Filled 2019-10-31: qty 28

## 2019-10-31 MED ORDER — LIDOCAINE-PRILOCAINE 2.5-2.5 % EX CREA: 1.0000 "application " | TOPICAL_CREAM | CUTANEOUS | Status: DC | PRN

## 2019-10-31 MED ORDER — ONDANSETRON HCL 4 MG/2ML IJ SOLN: 4.0000 mg | Freq: Four times a day (QID) | INTRAMUSCULAR | Status: DC | PRN

## 2019-10-31 MED ORDER — DIPHENHYDRAMINE HCL 25 MG PO CAPS
25.0000 mg | ORAL_CAPSULE | Freq: Four times a day (QID) | ORAL | Status: AC | PRN
  Administered 2019-11-01: 25 mg via ORAL
  Filled 2019-10-31: qty 1

## 2019-10-31 MED ORDER — ALBUTEROL SULFATE (2.5 MG/3ML) 0.083% IN NEBU
2.5000 mg | INHALATION_SOLUTION | RESPIRATORY_TRACT | Status: DC | PRN
  Filled 2019-10-31: qty 3

## 2019-10-31 MED ORDER — LIDOCAINE HCL (PF) 1 % IJ SOLN: 5.0000 mL | INTRAMUSCULAR | Status: DC | PRN

## 2019-10-31 MED ORDER — HYDRALAZINE HCL 20 MG/ML IJ SOLN
10.0000 mg | INTRAMUSCULAR | Status: DC | PRN
  Administered 2019-10-31: 15:00:00 10 mg via INTRAVENOUS
  Filled 2019-10-31: qty 1

## 2019-10-31 NOTE — ED Provider Notes (Signed)
[facility] EMERGENCY DEPARTMENT Provider Note   CSN: [id number] Arrival date & time: 10/31/19  0248     History Chief Complaint  Patient presents with  . Shortness of Breath    Jordan [name] is a 54 y.o. male.  54 year old male with a history of hypertension, ESRD (on M/W/F dialysis), tobacco use, ETOH abuse presents to the emergency department complaining of shortness of breath x2 days.  EMS called to the house for progressive symptoms.  Report oxygen saturations of 78% on room air upon their arrival.  Patient has no history of chronic oxygen requirement.  States that he was only dialyzed for 1 hour on Wednesday and subsequently missed dialysis on Friday.  Shortness of breath is aggravated with exertion.  He complains of some associated nausea as well as lower extremity swelling.  Makes little to no urine at baseline.  Denies fevers, chest pain or tightness, vomiting, syncope.  Continues to smoke a few cigarettes a day.  Denies history of COPD or asthma.    Admitted to the hospital x 3 since 09/19/19 for pulmonary edema. Hx of noncompliance with dialysis.  The history is provided by the patient. No language interpreter was used.  Shortness of Breath      Past Medical History:  Diagnosis Date  . Anemia   . CKD (chronic kidney disease)    Stage 5  Dialysis - M/W/F in [place], [place]  . Dyspnea    tx with inhaler when sick  . ED (erectile dysfunction)   . ETOH abuse   . History of blood transfusion   . Hypertension   . Wears dentures     Patient Active Problem List   Diagnosis Date Noted  . Acute on chronic congestive heart failure ([place]) 10/22/2019  . Acute respiratory distress 10/06/2019  . Volume overload 10/05/2019  . Hypokalemia 10/05/2019  . ESRD (end stage renal disease) ([place]) 10/01/2019  . Leg mass, right   . Hyperbilirubinemia   . Prolonged QT interval 09/19/2019  . Symptomatic anemia 09/19/2019  . Pruritus 09/19/2019  . Hypercalcemia  01/19/2019  . Anemia in ESRD (end-stage renal disease) ([place]) 05/17/2018  . Hepatitis B 03/19/2018  . ESRD on dialysis ([place]) 05/26/2017  . Essential hypertension 05/26/2017  . Urinary tract infection, site not specified 10/17/2016  . Coagulation defect, unspecified ([place]) 08/13/2016  . Diarrhea, unspecified 08/13/2016  . Encounter for immunization 08/13/2016  . Headache, unspecified 08/13/2016  . Hypertensive chronic kidney disease with stage 5 chronic kidney disease or end stage renal disease ([place]) 08/13/2016  . Iron deficiency anemia, unspecified 08/13/2016  . Pain, unspecified 08/13/2016  . Moderate protein-calorie malnutrition ([place]) 08/13/2016  . Secondary hyperparathyroidism of renal origin ([place]) 08/13/2016    Past Surgical History:  Procedure Laterality Date  . BASCILIC VEIN TRANSPOSITION Left 08/07/2016   Procedure: LEFT BASILIC VEIN TRANSPOSITION;  Surgeon: Angelia Mould, MD;  Location: [facility];  Service: Vascular;  Laterality: Left;  . CLOSED REDUCTION NASAL FRACTURE N/A 08/11/2019   Procedure: CLOSED REDUCTION NASAL FRACTURE WITH STABILIZATION;  Surgeon: Irene Limbo, MD;  Location: [facility];  Service: Plastics;  Laterality: N/A;  . COLONOSCOPY N/A 08/12/2016   Procedure: COLONOSCOPY;  Surgeon: Otis Brace, MD;  Location: [facility];  Service: Gastroenterology;  Laterality: N/A;  . ESOPHAGOGASTRODUODENOSCOPY N/A 08/12/2016   Procedure: ESOPHAGOGASTRODUODENOSCOPY (EGD);  Surgeon: Otis Brace, MD;  Location: [facility];  Service: Gastroenterology;  Laterality: N/A;  . INSERTION OF DIALYSIS CATHETER N/A 08/07/2016   Procedure: INSERTION OF TUNNELED  DIALYSIS CATHETER;  Surgeon: Angelia Mould, MD;  Location: [facility];  Service: Vascular;  Laterality: N/A;  . LIGATION OF ARTERIOVENOUS  FISTULA Left 09/12/2016   Procedure: BANDING OF LEFT  ARTERIOVENOUS  FISTULA;  Surgeon: Angelia Mould, MD;  Location: [facility];  Service: Vascular;  Laterality: Left;  .  LOWER EXTREMITY INTERVENTION Right 12/02/2018   Procedure: LOWER EXTREMITY INTERVENTION;  Surgeon: Algernon Huxley, MD;  Location: [facility];  Service: Cardiovascular;  Laterality: Right;       Family History  Problem Relation Age of Onset  . Diabetes Mother   . Kidney failure Mother   . Healthy Father   . Kidney failure Brother   . Healthy Sister   . Kidney disease Daughter   . Post-traumatic stress disorder Neg Hx   . Bladder Cancer Neg Hx   . Kidney cancer Neg Hx     Social History   Tobacco Use  . Smoking status: Current Every Day Smoker    Packs/day: 0.50    Years: 40.00    Pack years: 20.00    Types: Cigarettes  . Smokeless tobacco: Never Used  Substance Use Topics  . Alcohol use: Yes    Alcohol/week: 21.0 standard drinks    Types: 21 Cans of beer per week    Comment: none since 08/04/16  . Drug use: No    Home Medications Prior to Admission medications   Medication Sig Start Date End Date Taking? Authorizing Provider  albuterol (PROVENTIL HFA;VENTOLIN HFA) 108 (90 Base) MCG/ACT inhaler Inhale 2 puffs into the lungs every 4 (four) hours as needed for wheezing or shortness of breath. 10/03/18   Orpah Greek, MD  calcitRIOL (ROCALTROL) 0.5 MCG capsule Take 1 capsule (0.5 mcg total) by mouth every Monday, Wednesday, and Friday with hemodialysis. 10/24/19   Pokhrel, Corrie Mckusick, MD  calcium acetate (PHOSLO) 667 MG capsule Take 2 capsules (1,334 mg total) by mouth 3 (three) times daily with meals. 02/12/19   Fritzi Mandes, MD  hydrOXYzine (ATARAX/VISTARIL) 50 MG tablet Take 50 mg by mouth 3 (three) times daily. 08/09/19   [provider]  nicotine (NICODERM CQ - DOSED IN MG/24 HOURS) 14 mg/24hr patch Place 1 patch (14 mg total) onto the skin daily. 10/08/19   Black, Lezlie Octave, NP  sildenafil (VIAGRA) 50 MG tablet Take 50 mg by mouth daily as needed for erectile dysfunction. 07/20/19   [provider]  triamcinolone (KENALOG) 0.025 % ointment Apply 1  application topically 2 (two) times daily. 10/23/19   Pokhrel, Corrie Mckusick, MD    Allergies    Patient has no known allergies.  Review of Systems   Review of Systems  Respiratory: Positive for shortness of breath.   Ten systems reviewed and are negative for acute change, except as noted in the HPI.    Physical Exam Updated Vital Signs BP (!) 186/117   Pulse (!) 115   Temp 98.7 F (37.1 C) (Oral)   Resp (!) 25   SpO2 98%   Physical Exam Vitals and nursing note reviewed.  Constitutional:      General: He is not in acute distress.    Appearance: He is well-developed. He is not diaphoretic.     Comments: Nontoxic appearing.  HENT:     Head: Normocephalic and atraumatic.  Eyes:     General: No scleral icterus.    Conjunctiva/sclera: Conjunctivae normal.  Cardiovascular:     Rate and Rhythm: Regular rhythm. Tachycardia present.  Pulses: Normal pulses.  Pulmonary:     Effort: Tachypnea and accessory muscle usage (mild) present. No respiratory distress.     Breath sounds: Wheezing present.     Comments: Diffuse expiratory wheezing. Rales in b/l bases. SpO2 97-98% on 3L via [place]. Abdominal:     Comments: Soft, distended, nontender abdomen.  Musculoskeletal:        General: Normal range of motion.     Cervical back: Normal range of motion.     Comments: 1+ pitting edema BLE  Skin:    General: Skin is warm and dry.     Coloration: Skin is not pale.     Findings: No erythema or rash.  Neurological:     Mental Status: He is alert and oriented to person, place, and time.     Coordination: Coordination normal.     Comments: Ambulatory from stretcher to exam room bed  Psychiatric:        Behavior: Behavior normal.     ED Results / Procedures / Treatments   Labs (all labs ordered are listed, but only abnormal results are displayed) Labs Reviewed  CBC WITH DIFFERENTIAL/PLATELET - Abnormal; Notable for the following components:      Result Value   RBC 2.51 (*)    Hemoglobin  8.6 (*)    HCT 29.2 (*)    MCV 116.3 (*)    MCH 34.3 (*)    MCHC 29.5 (*)    RDW 18.6 (*)    Platelets 128 (*)    Neutro Abs 7.9 (*)    Lymphs Abs 0.6 (*)    All other components within normal limits  I-STAT CHEM 8, ED - Abnormal; Notable for the following components:   Potassium 5.3 (*)    BUN 103 (*)    Creatinine, Ser >18.00 (*)    Calcium, Ion 0.95 (*)    Hemoglobin 8.8 (*)    HCT 26.0 (*)    All other components within normal limits  POCT I-STAT 7, (LYTES, BLD GAS, ICA,H+H) - Abnormal; Notable for the following components:   pCO2 arterial 30.0 (*)    pO2, Arterial 53.0 (*)    Bicarbonate 19.9 (*)    TCO2 21 (*)    Acid-base deficit 4.0 (*)    Potassium 5.2 (*)    Calcium, Ion 1.08 (*)    HCT 26.0 (*)    Hemoglobin 8.8 (*)    All other components within normal limits  RESPIRATORY PANEL BY RT PCR (FLU A&B, COVID)  COMPREHENSIVE METABOLIC PANEL  MAGNESIUM  TROPONIN I (HIGH SENSITIVITY)    EKG None  Radiology DG Chest Portable 1 View  Result Date: 10/31/2019 CLINICAL DATA:  Shortness of breath and chest pain. EXAM: PORTABLE CHEST 1 VIEW COMPARISON:  10/22/2019 FINDINGS: Artifact overlies the chest. The heart is enlarged. There is aortic atherosclerosis. Widespread opacity likely reflects interstitial and alveolar pulmonary edema. No visible effusion at this time. No acute bone finding. IMPRESSION: Acute pulmonary edema. Electronically Signed   By: Nelson Chimes M.D.   On: 10/31/2019 03:41    Procedures .Critical Care Performed by: Antonietta Breach, PA-C Authorized by: Antonietta Breach, PA-C   Critical care provider statement:    Critical care time (minutes):  45   Critical care was necessary to treat or prevent imminent or life-threatening deterioration of the following conditions:  Respiratory failure and renal failure   Critical care was time spent personally by me on the following activities:  Discussions with consultants, evaluation of patient's  response to treatment,  examination of patient, ordering and performing treatments and interventions, ordering and review of laboratory studies, ordering and review of radiographic studies, pulse oximetry, re-evaluation of patient's condition, obtaining history from patient or surrogate and review of old charts   (including critical care time)  Medications Ordered in ED Medications  albuterol (PROVENTIL) (2.5 MG/3ML) 0.083% nebulizer solution 5 mg (5 mg Nebulization Given 10/31/19 0308)    ED Course  I have reviewed the triage vital signs and the nursing notes.  Pertinent labs & imaging results that were available during my care of the patient were reviewed by me and considered in my medical decision making (see chart for details).    MDM Rules/Calculators/A&P                      54 year old male with a history of ESRD on dialysis presents to the emergency department for 48 hours of shortness of breath.  Missed dialysis on Friday and was only dialyzed for 1 hour the Wednesday prior.  On chart review, patient appears to have history of noncompliance with dialysis.  Was noted to have oxygen saturations in the 70s on EMS arrival.  No history of chronic oxygen requirement.  Sats have been stable on supplemental oxygen via nasal cannula.  BiPAP ordered for management as ED evaluation suggestive of acute pulmonary edema, likely secondary to missed dialysis.  Plan for admission to hospitalist service for further management.  Dr. Hal Hope to assess in the ED.   Final Clinical Impression(s) / ED Diagnoses Final diagnoses:  Acute pulmonary edema ([place])  Noncompliance with renal dialysis [facility])    Rx / DC Orders ED Discharge Orders    None       Antonietta Breach, PA-C 10/31/19 [place], Delice Bison, DO 10/31/19 [phone]

## 2019-10-31 NOTE — Progress Notes (Addendum)
PROGRESS NOTE    Jordan [name]  [id number] DOB: [birth year] DOA: 10/31/2019 PCP: Jordan Fenton, NP    Brief Narrative:  54 year old male who presented with dyspnea.  He does have significant past medical history for end-stage renal disease on hemodialysis (M/W/F), hypertension and chronic anemia.  Patient did not have a full dialysis session about 5 days ago, he missed his last hemodialysis session.  He presented with persistent and severe dyspnea.  On his initial physical examination he was in respiratory distress and placed on a BiPAP.  Pressure 155/92, heart rate 103, respiratory 19, oxygen saturation 99%, lungs clear to auscultation bilaterally, heart S1-S2 present rhythm, soft abdomen, no lower extremity edema.  Chest radiograph with cardiomegaly, bilateral interstitial infiltrates with vascular hilar congestion bilaterally.  Patient was admitted to the hospital working diagnosis of volume overload with pulmonary edema due to end-stage renal disease noncompliant with hemodialysis.  Assessment & Plan:   Principal Problem:   Acute pulmonary edema (HCC) Active Problems:   ESRD on dialysis [facility])   Essential hypertension   Anemia in ESRD (end-stage renal disease) (HCC)   Pruritus   1. Acute hypoxic respiratory failure due to volume overload pulmonary edema due to ESRD/ non compliant with renal replacement therapy. Patient had urgent HD today, underwent ultrafiltration 3,120 ml with improvement of oxygenation and symptoms. Will do trial off Biapap and continue oxymetry monitoring. Keep 02 sat above 88%.   Patient will likely need further ultrafiltration while hospitalized to achieve euvolemia. Follow with nephrology recommendations.  Patient continue to be at high risk for worsening respiratory failure.   2. Uncontrolled HTN/ emergency. Blood pressure systolic [id number] mmHg, now after ultrafiltration. Will continue close monitoring of blood pressure. At home not on antihypertensive  medications. On as needed hydralazine while hospitalized.   3. Anemia of chronic renal disease. Follow as outpatient. Hgb stable.   4. Metabolic bone disease. Continue with calcium acetate and calcitriol.      DVT prophylaxis: heparin   Code Status: full Family Communication: no family at the bedside  Disposition Plan/ discharge barriers: patient continue critically ill.    Consultants:   Nephrology     Subjective: Patient seen on HD, continue to have dyspnea, no chest pain, no nausea or vomiting. Positive weakness.   Objective: Vitals:   10/31/19 0929 10/31/19 0940 10/31/19 0953 10/31/19 1000  BP: (!) 173/100 (!) 170/98 (!) 172/102 (!) 172/100  Pulse: (!) 102  (!) 102 100  Resp: 19     Temp:  98.6 F (37 C)    TempSrc:  Axillary    SpO2: 100%     Weight:  74.1 kg     No intake or output data in the 24 hours ending 10/31/19 1024 Filed Weights   10/31/19 0940  Weight: 74.1 kg    Examination:   General: Not in pain or dyspnea, deconditioned  Neurology: Awake and alert, non focal  E ENT: no pallor, no icterus, oral mucosa moist Cardiovascular: No JVD. S1-S2 present, rhythmic, no gallops, rubs, or murmurs. No lower extremity edema. Pulmonary: positive breath sounds bilaterally,with no wheezing, rhonchi or rales. Gastrointestinal. Abdomen with no organomegaly, non tender, no rebound or guarding Skin. No rashes Musculoskeletal: no joint deformities     Data Reviewed: I have personally reviewed following labs and imaging studies  CBC: Recent Labs  Lab 10/25/19 1107 10/31/19 0256 10/31/19 0301 10/31/19 0359 10/31/19 0630  WBC 8.3 9.1  --   --  8.4  NEUTROABS 6.4 7.9*  --   --   --  HGB 9.0* 8.6* 8.8* 8.8* 7.4*  HCT 30.4* 29.2* 26.0* 26.0* 24.3*  MCV 114.7* 116.3*  --   --  115.2*  PLT 127* 128*  --   --  0000000   Basic Metabolic Panel: Recent Labs  Lab 10/25/19 1107 10/31/19 0256 10/31/19 0301 10/31/19 0359 10/31/19 0630  NA 138 144 139 139  --    K 4.4 5.6* 5.3* 5.2*  --   CL 97* 104 107  --   --   CO2 22 18*  --   --   --   GLUCOSE 101* 94 93  --   --   BUN 74* 100* 103*  --   --   CREATININE 12.17* 17.41* >18.00*  --  17.21*  CALCIUM 8.6* 8.7*  --   --   --   MG  --  2.1  --   --   --    GFR: Estimated Creatinine Clearance: 4.6 mL/min (A) (by C-G formula based on SCr of 17.21 mg/dL (H)). Liver Function Tests: Recent Labs  Lab 10/25/19 1107 10/31/19 0256  AST 19 18  ALT 19 15  ALKPHOS 176* 146*  BILITOT 1.5* 1.8*  PROT 7.5 6.9  ALBUMIN 3.7 3.4*   No results for input(s): LIPASE, AMYLASE in the last 168 hours. No results for input(s): AMMONIA in the last 168 hours. Coagulation Profile: No results for input(s): INR, PROTIME in the last 168 hours. Cardiac Enzymes: No results for input(s): CKTOTAL, CKMB, CKMBINDEX, TROPONINI in the last 168 hours. BNP (last 3 results) No results for input(s): PROBNP in the last 8760 hours. HbA1C: No results for input(s): HGBA1C in the last 72 hours. CBG: No results for input(s): GLUCAP in the last 168 hours. Lipid Profile: No results for input(s): CHOL, HDL, LDLCALC, TRIG, CHOLHDL, LDLDIRECT in the last 72 hours. Thyroid Function Tests: No results for input(s): TSH, T4TOTAL, FREET4, T3FREE, THYROIDAB in the last 72 hours. Anemia Panel: No results for input(s): VITAMINB12, FOLATE, FERRITIN, TIBC, IRON, RETICCTPCT in the last 72 hours.    Radiology Studies: I have reviewed all of the imaging during this hospital visit personally     Scheduled Meds: . calcitRIOL  0.5 mcg Oral Q M,W,F-HD  . calcium acetate  1,334 mg Oral TID WC  . Chlorhexidine Gluconate Cloth  6 each Topical Q0600  . heparin  5,000 Units Subcutaneous Q8H   Continuous Infusions:   LOS: 0 days        Jordan Naeem Gerome Apley, MD

## 2019-10-31 NOTE — Progress Notes (Signed)
Switched pt from [facility] to V-60 BIPAP for transport and use on 6E. No noted respiratory issues at this time.

## 2019-10-31 NOTE — Procedures (Signed)
   I was present at this dialysis session, have reviewed the session itself and made  appropriate changes Kelly Splinter MD [place] pager [phone]   10/31/2019, 1:50 PM

## 2019-10-31 NOTE — Progress Notes (Signed)
Patient transported to dialysis without any apparent complications.  

## 2019-10-31 NOTE — ED Notes (Signed)
Pt resting in bed. Pt denies new or worsening complaints. Will continue to monitor. No distress noted. Pt on continuous monitoring via blood pressure, pulse ox, and cardiac monitor.  

## 2019-10-31 NOTE — Progress Notes (Signed)
RT placed patient on BIPAP per MD order. Patient tolerating settings well at this time. RT will monitor as needed.

## 2019-10-31 NOTE — Progress Notes (Signed)
[facility] Progress Note  Subjective: pt recently dc'd on 1/31 for SOB / vol overload.  Pt now returned to OP HD this am severely SOB so sent to ED where CXR showing pulm edema and pt required bipap support for resp distress / RR 31.  Now RR 16 and pt comfortable, asked to see for HD.    Vitals:   10/31/19 1100 10/31/19 1130 10/31/19 1200 10/31/19 1230  BP: (!) 172/106 (!) 170/100 (!) 174/100 (!) 178/79  Pulse: (!) 106 (!) 104 100 99  Resp:      Temp:      TempSrc:      SpO2:      Weight:        Physical Exam General: breathing easily on room air NAD Heart: RRR Lungs: few crackles at bases Abdomen: soft NT Extremities: no sig LE edema Dialysis Access:  Left upper AVF + bruit  Dialysis Orders: [facility] MWF 4h 68kg 2/2 bath Hep none LUA AVF  Assessment/Plan: 1. SOB/ pulmonary edema - missed 1.5 HD sessions last week. Well over dry wt, plan HD semi-urgent today, max UF and wean off bipap. May need reassess edw and/or why missing dialysis.  2. ESRD -MWF - HD today as above 3. Anemia - cont meds 4. Secondary hyperparathyroidism -cont meds 5. HTN/volume - did not get him below edw on recent admit 6. Nutrition - renal diet with fluid restriction 7.  Dialysis noncompliance    Kelly Splinter 10/31/2019, 1:45 PM  Inpatient medications: . calcitRIOL  0.5 mcg Oral Q M,W,F-HD  . calcium acetate  1,334 mg Oral TID WC  . Chlorhexidine Gluconate Cloth  6 each Topical Q0600  . heparin  5,000 Units Subcutaneous Q8H    acetaminophen **OR** acetaminophen, albuterol, hydrALAZINE, ondansetron **OR** ondansetron (ZOFRAN) IV Recent Labs  Lab 10/31/19 0256 10/31/19 0256 10/31/19 0301 10/31/19 0301 10/31/19 0359 10/31/19 0630 10/31/19 1000  NA 144   < > 139   < > 139  --  141  K 5.6*   < > 5.3*   < > 5.2*  --  6.1*  CL 104   < > 107  --   --   --  103  CO2 18*  --   --   --   --   --  19*  GLUCOSE 94   < > 93  --   --   --  84  BUN 100*   < > 103*  --   --    --  105*  CREATININE 17.41*   < > >18.00*   < >  --  17.21* 18.25*  CALCIUM 8.7*  --   --   --   --   --  8.6*  PHOS  --   --   --   --   --   --  7.1*   < > = values in this interval not displayed.

## 2019-10-31 NOTE — H&P (Signed)
History and Physical    Jordan [name] [id number] DOB: 1965-11-13 DOA: 10/31/2019  PCP: Jearld Fenton, NP  Patient coming from: Home.  Chief Complaint: Shortness of breath.  HPI: Jordan [name] is a 54 y.o. male with history of ESRD on hemodialysis on Monday Wednesday Friday, hypertension, anemia presents to the ER because of worsening shortness of breath.  Patient states he did not have a full session on Wednesday about 5 days ago of dialysis and had missed his dialysis on Friday.  Became more short of breath since yesterday presents to the ER.  Denies any chest pain productive cough fever chills.  ED Course: In the ER patient was short of breath and hypoxic with chest x-ray showing pulmonary edema was placed on BiPAP.  Covid test was negative.  Patient was afebrile.  Labs show potassium of 5.6 creatinine 17.4 high sensitive troponin was 104 CBC largely at baseline.  Diastolic blood pressure was elevated at 98.  Systolic was [id number].  Review of Systems: As per HPI, rest all negative.   Past Medical History:  Diagnosis Date  . Anemia   . CKD (chronic kidney disease)    Stage 5  Dialysis - M/W/F in [place], [place]  . Dyspnea    tx with inhaler when sick  . ED (erectile dysfunction)   . ETOH abuse   . History of blood transfusion   . Hypertension   . Wears dentures     Past Surgical History:  Procedure Laterality Date  . BASCILIC VEIN TRANSPOSITION Left 08/07/2016   Procedure: LEFT BASILIC VEIN TRANSPOSITION;  Surgeon: Angelia Mould, MD;  Location: [facility];  Service: Vascular;  Laterality: Left;  . CLOSED REDUCTION NASAL FRACTURE N/A 08/11/2019   Procedure: CLOSED REDUCTION NASAL FRACTURE WITH STABILIZATION;  Surgeon: Irene Limbo, MD;  Location: [facility];  Service: Plastics;  Laterality: N/A;  . COLONOSCOPY N/A 08/12/2016   Procedure: COLONOSCOPY;  Surgeon: Otis Brace, MD;  Location: [facility];  Service: Gastroenterology;  Laterality: N/A;  .  ESOPHAGOGASTRODUODENOSCOPY N/A 08/12/2016   Procedure: ESOPHAGOGASTRODUODENOSCOPY (EGD);  Surgeon: Otis Brace, MD;  Location: [facility];  Service: Gastroenterology;  Laterality: N/A;  . INSERTION OF DIALYSIS CATHETER N/A 08/07/2016   Procedure: INSERTION OF TUNNELED DIALYSIS CATHETER;  Surgeon: Angelia Mould, MD;  Location: [facility];  Service: Vascular;  Laterality: N/A;  . LIGATION OF ARTERIOVENOUS  FISTULA Left 09/12/2016   Procedure: BANDING OF LEFT  ARTERIOVENOUS  FISTULA;  Surgeon: Angelia Mould, MD;  Location: [facility];  Service: Vascular;  Laterality: Left;  . LOWER EXTREMITY INTERVENTION Right 12/02/2018   Procedure: LOWER EXTREMITY INTERVENTION;  Surgeon: Algernon Huxley, MD;  Location: [facility];  Service: Cardiovascular;  Laterality: Right;     reports that he has been smoking cigarettes. He has a 20.00 pack-year smoking history. He has never used smokeless tobacco. He reports current alcohol use of about 21.0 standard drinks of alcohol per week. He reports that he does not use drugs.  No Known Allergies  Family History  Problem Relation Age of Onset  . Diabetes Mother   . Kidney failure Mother   . Healthy Father   . Kidney failure Brother   . Healthy Sister   . Kidney disease Daughter   . Post-traumatic stress disorder Neg Hx   . Bladder Cancer Neg Hx   . Kidney cancer Neg Hx     Prior to Admission medications   Medication Sig Start Date End Date Taking? Authorizing  Provider  albuterol (PROVENTIL HFA;VENTOLIN HFA) 108 (90 Base) MCG/ACT inhaler Inhale 2 puffs into the lungs every 4 (four) hours as needed for wheezing or shortness of breath. 10/03/18  Yes Orpah Greek, MD  calcitRIOL (ROCALTROL) 0.5 MCG capsule Take 1 capsule (0.5 mcg total) by mouth every Monday, Wednesday, and Friday with hemodialysis. 10/24/19  Yes Pokhrel, Laxman, MD  calcium acetate (PHOSLO) 667 MG capsule Take 2 capsules (1,334 mg total) by mouth 3 (three) times daily  with meals. 02/12/19  Yes Fritzi Mandes, MD  hydrOXYzine (ATARAX/VISTARIL) 50 MG tablet Take 50 mg by mouth 3 (three) times daily. 08/09/19  Yes [provider]  sildenafil (VIAGRA) 50 MG tablet Take 50 mg by mouth daily as needed for erectile dysfunction. 07/20/19  Yes [provider]  triamcinolone (KENALOG) 0.025 % ointment Apply 1 application topically 2 (two) times daily. 10/23/19  Yes Pokhrel, Laxman, MD  nicotine (NICODERM CQ - DOSED IN MG/24 HOURS) 14 mg/24hr patch Place 1 patch (14 mg total) onto the skin daily. Patient not taking: Reported on 10/31/2019 10/08/19   Radene Gunning, NP    Physical Exam: Constitutional: Moderately built and nourished. Vitals:   10/31/19 0535 10/31/19 0545 10/31/19 0600 10/31/19 0606  BP: (!) 155/92 (!) 150/90 (!) 156/98 (!) 153/99  Pulse: (!) 105 (!) 104 (!) 104 (!) 103  Resp:  16 17 19   Temp:    97.6 F (36.4 C)  TempSrc:    Axillary  SpO2:  97% 100% 99%   Eyes: Anicteric no pallor. ENMT: No discharge from the ears eyes nose or mouth. Neck: No masses.  No neck rigidity but no JVD appreciated. Respiratory: No rhonchi or crepitations. Cardiovascular: S1-S2 heard. Abdomen: Soft nontender bowel sound present. Musculoskeletal: No edema. Skin: No rash. Neurologic: Alert awake oriented time place and person.  Moves all extremities. Psychiatric: Appears normal per normal affect.   Labs on Admission: I have personally reviewed following labs and imaging studies  CBC: Recent Labs  Lab 10/25/19 1107 10/31/19 0256 10/31/19 0301 10/31/19 0359  WBC 8.3 9.1  --   --   NEUTROABS 6.4 7.9*  --   --   HGB 9.0* 8.6* 8.8* 8.8*  HCT 30.4* 29.2* 26.0* 26.0*  MCV 114.7* 116.3*  --   --   PLT 127* 128*  --   --    Basic Metabolic Panel: Recent Labs  Lab 10/25/19 1107 10/31/19 0256 10/31/19 0301 10/31/19 0359  NA 138 144 139 139  K 4.4 5.6* 5.3* 5.2*  CL 97* 104 107  --   CO2 22 18*  --   --   GLUCOSE 101* 94 93  --   BUN 74* 100*  103*  --   CREATININE 12.17* 17.41* >18.00*  --   CALCIUM 8.6* 8.7*  --   --   MG  --  2.1  --   --    GFR: CrCl cannot be calculated (This lab value cannot be used to calculate CrCl because it is not a number: >18.00). Liver Function Tests: Recent Labs  Lab 10/25/19 1107 10/31/19 0256  AST 19 18  ALT 19 15  ALKPHOS 176* 146*  BILITOT 1.5* 1.8*  PROT 7.5 6.9  ALBUMIN 3.7 3.4*   No results for input(s): LIPASE, AMYLASE in the last 168 hours. No results for input(s): AMMONIA in the last 168 hours. Coagulation Profile: No results for input(s): INR, PROTIME in the last 168 hours. Cardiac Enzymes: No results for input(s): CKTOTAL, CKMB, CKMBINDEX,  TROPONINI in the last 168 hours. BNP (last 3 results) No results for input(s): PROBNP in the last 8760 hours. HbA1C: No results for input(s): HGBA1C in the last 72 hours. CBG: No results for input(s): GLUCAP in the last 168 hours. Lipid Profile: No results for input(s): CHOL, HDL, LDLCALC, TRIG, CHOLHDL, LDLDIRECT in the last 72 hours. Thyroid Function Tests: No results for input(s): TSH, T4TOTAL, FREET4, T3FREE, THYROIDAB in the last 72 hours. Anemia Panel: No results for input(s): VITAMINB12, FOLATE, FERRITIN, TIBC, IRON, RETICCTPCT in the last 72 hours. Urine analysis:    Component Value Date/Time   COLORURINE STRAW (A) 08/04/2016 0448   APPEARANCEUR CLOUDY (A) 08/04/2016 0448   LABSPEC 1.011 08/04/2016 0448   PHURINE 5.5 08/04/2016 0448   GLUCOSEU 100 (A) 08/04/2016 0448   HGBUR SMALL (A) 08/04/2016 0448   BILIRUBINUR NEGATIVE 08/04/2016 0448   KETONESUR NEGATIVE 08/04/2016 0448   PROTEINUR >300 (A) 08/04/2016 0448   NITRITE NEGATIVE 08/04/2016 0448   LEUKOCYTESUR NEGATIVE 08/04/2016 0448   Sepsis Labs: @LABRCNTIP (procalcitonin:4,lacticidven:4) ) Recent Results (from the past 240 hour(s))  Respiratory Panel by RT PCR (Flu A&B, Covid) - Nasopharyngeal Swab     Status: None   Collection Time: 10/22/19  5:24 AM    Specimen: Nasopharyngeal Swab  Result Value Ref Range Status   SARS Coronavirus 2 by RT PCR NEGATIVE NEGATIVE Final    Comment: (NOTE) SARS-CoV-2 target nucleic acids are NOT DETECTED. The SARS-CoV-2 RNA is generally detectable in upper respiratoy specimens during the acute phase of infection. The lowest concentration of SARS-CoV-2 viral copies this assay can detect is 131 copies/mL. A negative result does not preclude SARS-Cov-2 infection and should not be used as the sole basis for treatment or other patient management decisions. A negative result may occur with  improper specimen collection/handling, submission of specimen other than nasopharyngeal swab, presence of viral mutation(s) within the areas targeted by this assay, and inadequate number of viral copies (<131 copies/mL). A negative result must be combined with clinical observations, patient history, and epidemiological information. The expected result is Negative. Fact Sheet for Patients:  PinkCheek.be Fact Sheet for Healthcare Providers:  GravelBags.it This test is not yet ap proved or cleared by the [place] FDA and  has been authorized for detection and/or diagnosis of SARS-CoV-2 by FDA under an Emergency Use Authorization (EUA). This EUA will remain  in effect (meaning this test can be used) for the duration of the COVID-19 declaration under Section 564(b)(1) of the Act, 21 U.S.C. section 360bbb-3(b)(1), unless the authorization is terminated or revoked sooner.    Influenza A by PCR NEGATIVE NEGATIVE Final   Influenza B by PCR NEGATIVE NEGATIVE Final    Comment: (NOTE) The Xpert Xpress SARS-CoV-2/FLU/RSV assay is intended as an aid in  the diagnosis of influenza from Nasopharyngeal swab specimens and  should not be used as a sole basis for treatment. Nasal washings and  aspirates are unacceptable for Xpert Xpress SARS-CoV-2/FLU/RSV  testing. Fact Sheet  for Patients: PinkCheek.be Fact Sheet for Healthcare Providers: GravelBags.it This test is not yet approved or cleared by the [place] FDA and  has been authorized for detection and/or diagnosis of SARS-CoV-2 by  FDA under an Emergency Use Authorization (EUA). This EUA will remain  in effect (meaning this test can be used) for the duration of the  Covid-19 declaration under Section 564(b)(1) of the Act, 21  U.S.C. section 360bbb-3(b)(1), unless the authorization is  terminated or revoked. Performed at [facility], [address] [place], [place] [address]   Culture, blood (Routine X 2) w Reflex to ID Panel     Status: None   Collection Time: 10/22/19 11:12 AM   Specimen: BLOOD  Result Value Ref Range Status   Specimen Description BLOOD RIGHT ANTECUBITAL  Final   Special Requests   Final    BOTTLES DRAWN AEROBIC AND ANAEROBIC Blood Culture adequate volume   Culture   Final    NO GROWTH 5 DAYS Performed at [facility], [place] [address] [place], [place] [address]    Report Status 10/27/2019 FINAL  Final  Culture, blood (Routine X 2) w Reflex to ID Panel     Status: None   Collection Time: 10/22/19 11:30 AM   Specimen: BLOOD RIGHT ARM  Result Value Ref Range Status   Specimen Description BLOOD RIGHT ARM  Final   Special Requests   Final    BOTTLES DRAWN AEROBIC ONLY Blood Culture adequate volume   Culture   Final    NO GROWTH 5 DAYS Performed at [facility], [place] [address], [place], [place] [address]    Report Status 10/27/2019 FINAL  Final  Culture, blood (routine x 2)     Status: None   Collection Time: 10/23/19 12:30 PM   Specimen: BLOOD RIGHT ARM  Result Value Ref Range Status   Specimen Description BLOOD RIGHT ARM  Final   Special Requests   Final    BOTTLES DRAWN AEROBIC AND ANAEROBIC Blood Culture results may not be optimal due to an inadequate volume of blood received in culture bottles    Culture   Final    NO GROWTH 5 DAYS Performed at [facility], [place] [address] [place], [place] [address]    Report Status 10/28/2019 FINAL  Final  Culture, blood (routine x 2)     Status: None   Collection Time: 10/23/19 12:39 PM   Specimen: BLOOD  Result Value Ref Range Status   Specimen Description BLOOD RIGHT ANTECUBITAL  Final   Special Requests   Final    BOTTLES DRAWN AEROBIC AND ANAEROBIC Blood Culture adequate volume   Culture   Final    NO GROWTH 5 DAYS Performed at [facility], [place] [address] [place], [place] [address]    Report Status 10/28/2019 FINAL  Final  Respiratory Panel by RT PCR (Flu A&B, Covid) - Nasopharyngeal Swab     Status: None   Collection Time: 10/31/19  3:00 AM   Specimen: Nasopharyngeal Swab  Result Value Ref Range Status   SARS Coronavirus 2 by RT PCR NEGATIVE NEGATIVE Final    Comment: (NOTE) SARS-CoV-2 target nucleic acids are NOT DETECTED. The SARS-CoV-2 RNA is generally detectable in upper respiratoy specimens during the acute phase of infection. The lowest concentration of SARS-CoV-2 viral copies this assay can detect is 131 copies/mL. A negative result does not preclude SARS-Cov-2 infection and should not be used as the sole basis for treatment or other patient management decisions. A negative result may occur with  improper specimen collection/handling, submission of specimen other than nasopharyngeal swab, presence of viral mutation(s) within the areas targeted by this assay, and inadequate number of viral copies (<131 copies/mL). A negative result must be combined with clinical observations, patient history, and epidemiological information. The expected result is Negative. Fact Sheet for Patients:  PinkCheek.be Fact Sheet for Healthcare Providers:  GravelBags.it This test is not yet ap proved or cleared by the [place] and  has been authorized for  detection and/or diagnosis of SARS-CoV-2 by FDA under an Emergency Use Authorization (EUA). This EUA will remain  in effect (meaning this test can be used) for the duration of the COVID-19 declaration under Section 564(b)(1) of the Act, 21 U.S.C. section 360bbb-3(b)(1), unless the authorization is terminated or revoked sooner.    Influenza A by PCR NEGATIVE NEGATIVE Final   Influenza B by PCR NEGATIVE NEGATIVE Final    Comment: (NOTE) The Xpert Xpress SARS-CoV-2/FLU/RSV assay is intended as an aid in  the diagnosis of influenza from Nasopharyngeal swab specimens and  should not be used as a sole basis for treatment. Nasal washings and  aspirates are unacceptable for Xpert Xpress SARS-CoV-2/FLU/RSV  testing. Fact Sheet for Patients: PinkCheek.be Fact Sheet for Healthcare Providers: GravelBags.it This test is not yet approved or cleared by the [place] FDA and  has been authorized for detection and/or diagnosis of SARS-CoV-2 by  FDA under an Emergency Use Authorization (EUA). This EUA will remain  in effect (meaning this test can be used) for the duration of the  Covid-19 declaration under Section 564(b)(1) of the Act, 21  U.S.C. section 360bbb-3(b)(1), unless the authorization is  terminated or revoked. Performed at [facility], [place] [address], [place], [place] [address]      Radiological Exams on Admission: DG Chest Portable 1 View  Result Date: 10/31/2019 CLINICAL DATA:  Shortness of breath and chest pain. EXAM: PORTABLE CHEST 1 VIEW COMPARISON:  10/22/2019 FINDINGS: Artifact overlies the chest. The heart is enlarged. There is aortic atherosclerosis. Widespread opacity likely reflects interstitial and alveolar pulmonary edema. No visible effusion at this time. No acute bone finding. IMPRESSION: Acute pulmonary edema. Electronically Signed   By: Nelson Chimes M.D.   On: 10/31/2019 03:41    EKG: Independently  reviewed.  Sinus tachycardia.  Assessment/Plan Principal Problem:   Acute pulmonary edema (HCC) Active Problems:   ESRD on dialysis [facility])   Essential hypertension   Anemia in ESRD (end-stage renal disease) (HCC)   Pruritus    1. Acute pulmonary edema secondary to noncompliance with his dialysis for which we will consult nephrology.  Presently on BiPAP.  I think patient's respiratory failure will improve with dialysis. 2. Hypertensive urgency likely from missing dialysis.  We will keep patient on as needed IV hydralazine.  I think patient blood pressure will improve dialysis. 3. Anemia secondary to ESRD  -follow CBC. 4. 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 5. Tobacco abuse -advised about quitting.  Given the acute respiratory failure with hypoxia will need close monitoring for any further deterioration in inpatient status.   DVT prophylaxis: Heparin. Code Status: Full code. Family Communication: Discussed with patient. Disposition Plan: Home. Consults called: We will consult nephrology. Admission status: Inpatient.   Rise Patience MD [facility] Pager [phone].  If 7PM-7AM, please contact night-coverage [email or web address] Password [facility]  10/31/2019, 6:32 AM

## 2019-11-01 ENCOUNTER — Inpatient Hospital Stay (HOSPITAL_COMMUNITY): Payer: Medicare Other

## 2019-11-01 LAB — BASIC METABOLIC PANEL
Anion gap: 16 — ABNORMAL HIGH (ref 5–15)
BUN: 64 mg/dL — ABNORMAL HIGH (ref 6–20)
CO2: 24 mmol/L (ref 22–32)
Calcium: 8.4 mg/dL — ABNORMAL LOW (ref 8.9–10.3)
Chloride: 100 mmol/L (ref 98–111)
Creatinine, Ser: 12.42 mg/dL — ABNORMAL HIGH (ref 0.61–1.24)
GFR calc Af Amer: 5 mL/min — ABNORMAL LOW (ref >60)
GFR calc non Af Amer: 4 mL/min — ABNORMAL LOW (ref >60)
Glucose, Bld: 105 mg/dL — ABNORMAL HIGH (ref 70–99)
Potassium: 4.6 mmol/L (ref 3.5–5.1)
Sodium: 140 mmol/L (ref 135–145)

## 2019-11-01 LAB — CBC
HCT: 25.4 % — ABNORMAL LOW (ref 39.0–52.0)
Hemoglobin: 7.8 g/dL — ABNORMAL LOW (ref 13.0–17.0)
MCH: 34.4 pg — ABNORMAL HIGH (ref 26.0–34.0)
MCHC: 30.7 g/dL (ref 30.0–36.0)
MCV: 111.9 fL — ABNORMAL HIGH (ref 80.0–100.0)
Platelets: 125 10*3/uL — ABNORMAL LOW (ref 150–400)
RBC: 2.27 MIL/uL — ABNORMAL LOW (ref 4.22–5.81)
RDW: 17.4 % — ABNORMAL HIGH (ref 11.5–15.5)
WBC: 7.1 10*3/uL (ref 4.0–10.5)
nRBC: 0 % (ref 0.0–0.2)

## 2019-11-01 IMAGING — DX DG CHEST 1V PORT
1 series · 2 of 2 positions shown · non-contrast
Comparison: [DATE] and [DATE]

CLINICAL DATA: Shortness of breath. Pulmonary edema.

EXAM:
PORTABLE CHEST 1 VIEW

[Series 1: chest ap · 0.14mm/px · 2 of 2 slices shown]
[im 1/2]
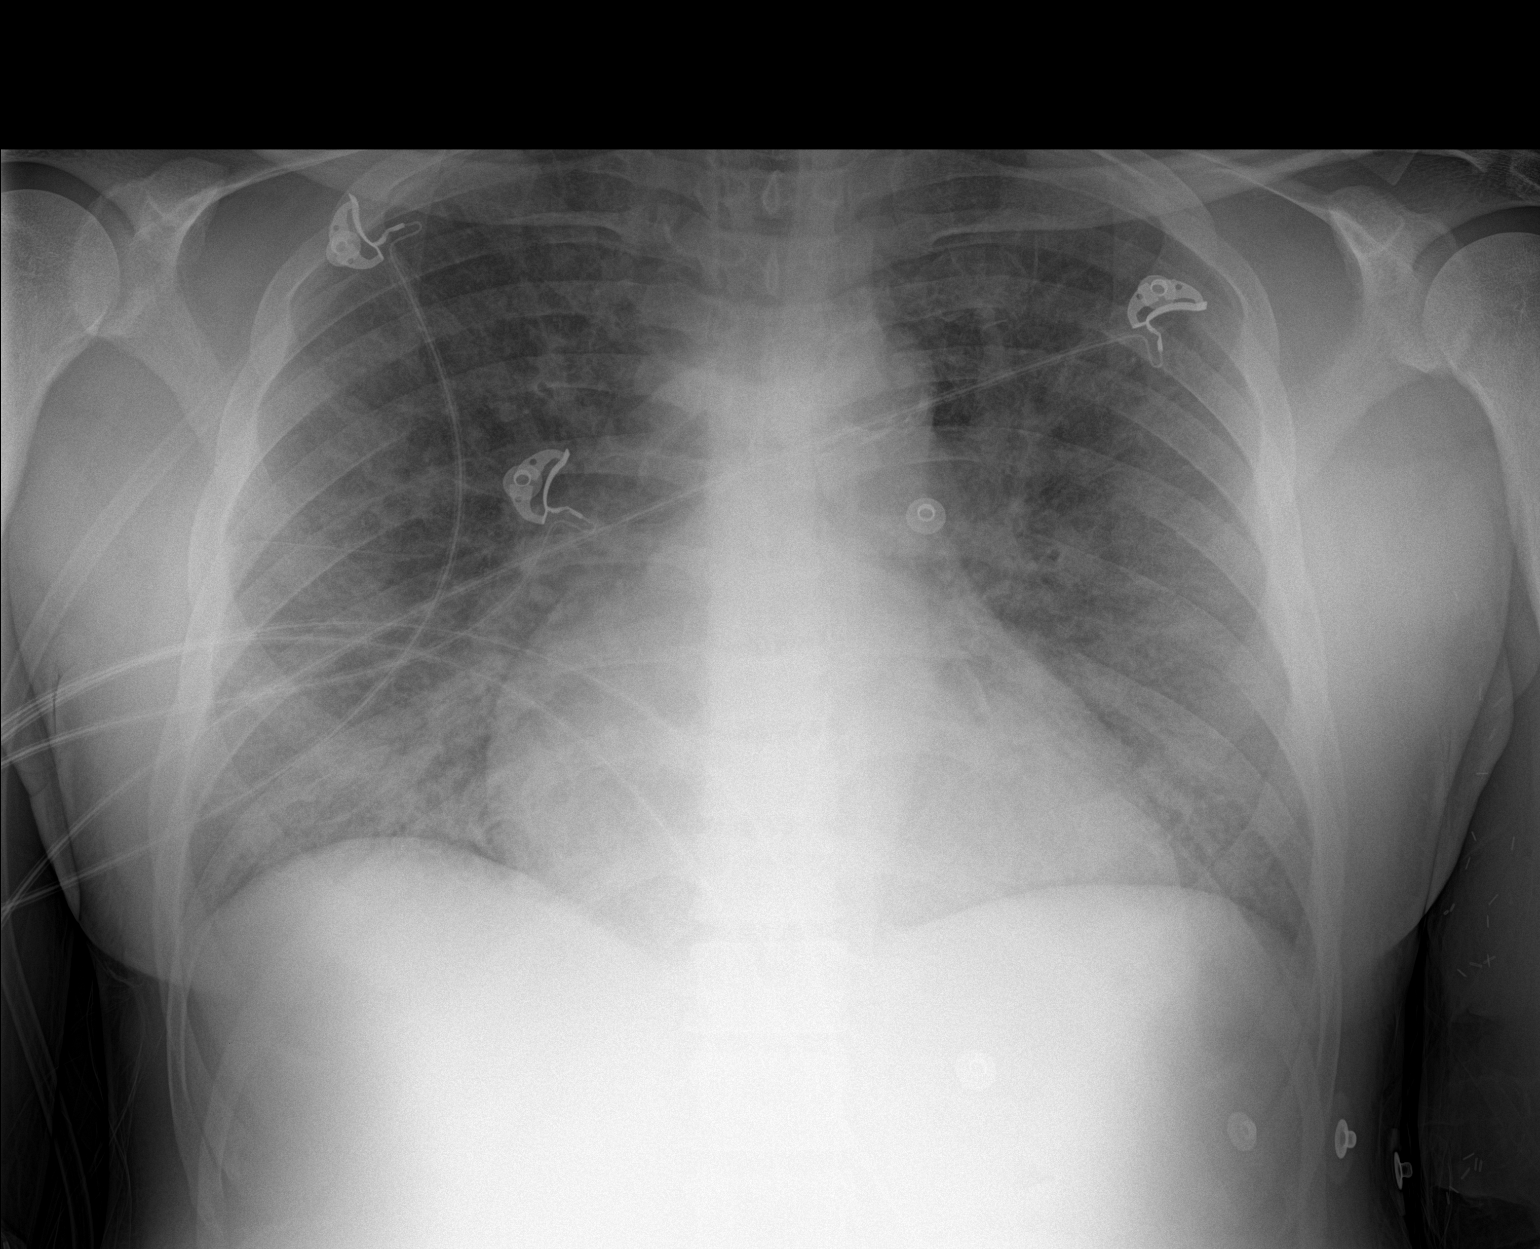
[im 2/2]
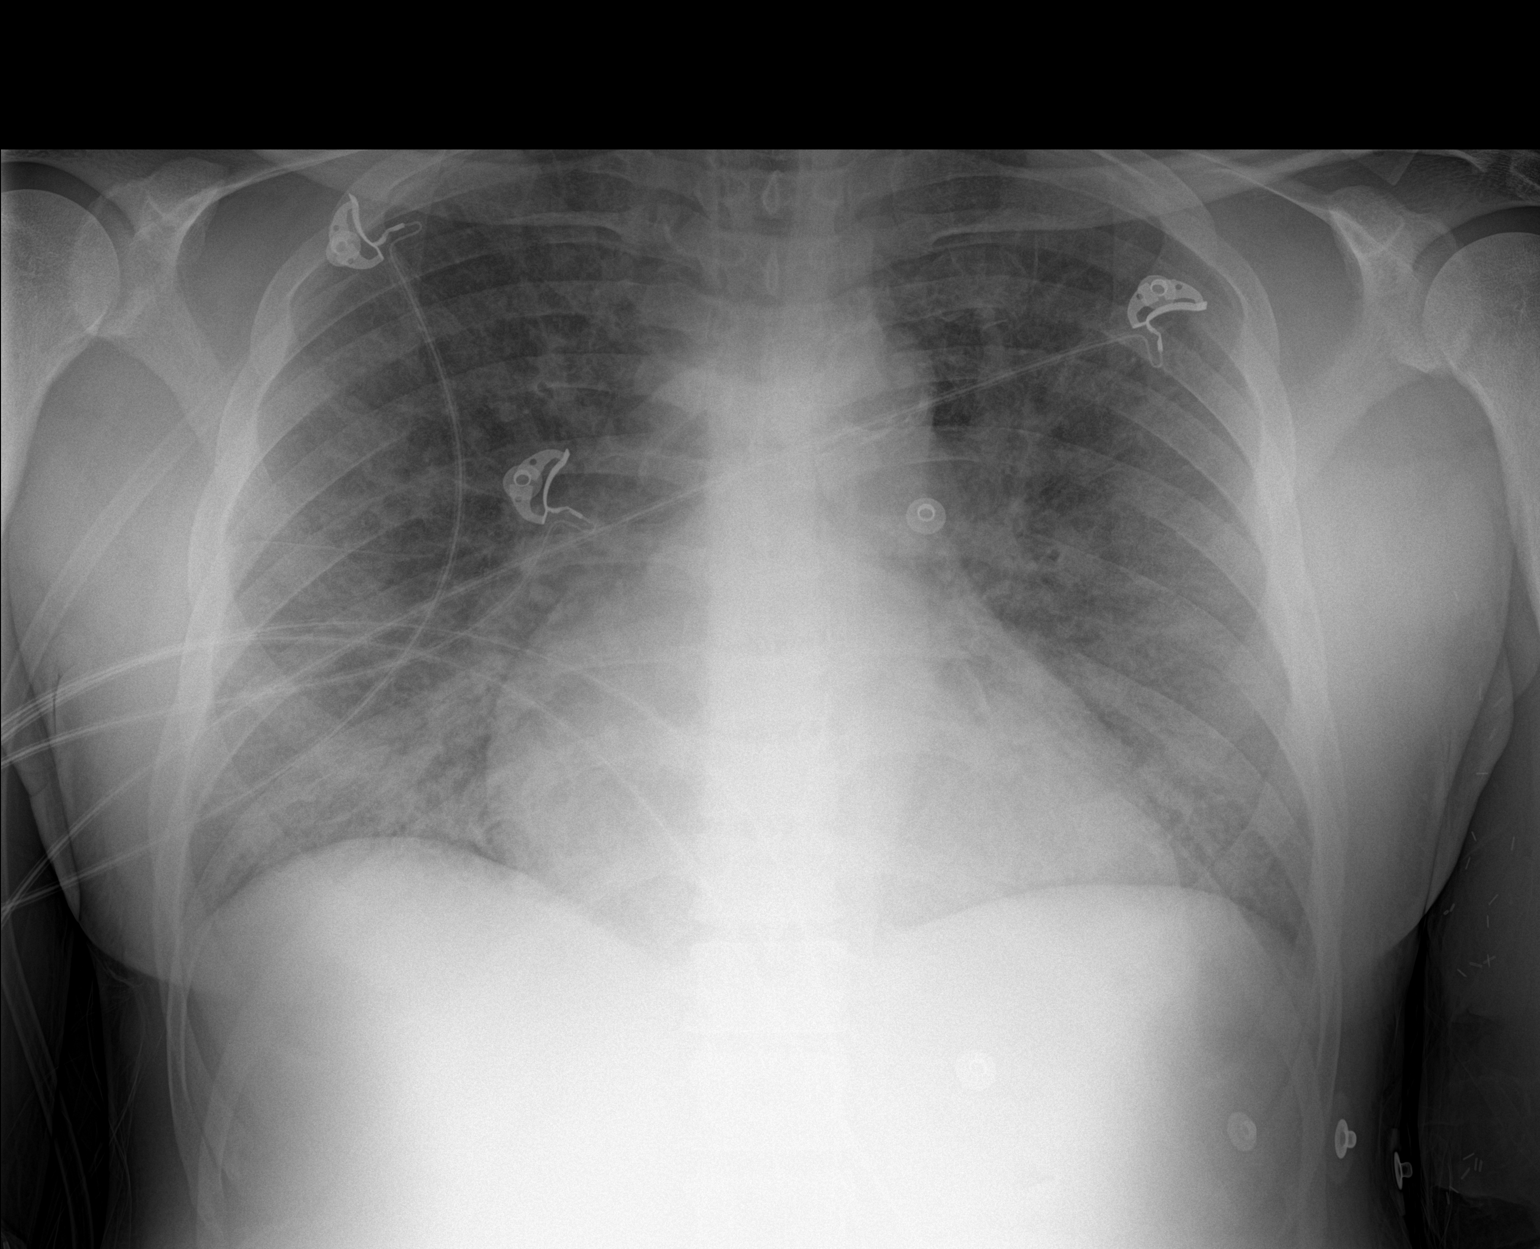

[2 of 2 positions shown; findings below may reference images not displayed]

FINDINGS: The diffuse bilateral pulmonary edema has improved. Heart size is
normal considering the AP portable technique. Slight pulmonary
vascular prominence. Aortic atherosclerosis. No effusions. No bone
abnormality.
IMPRESSION: Improving pulmonary edema.

Aortic Atherosclerosis ([R5]-[R5]).

## 2019-11-01 MED ORDER — OXYMETAZOLINE HCL 0.05 % NA SOLN
1.0000 | Freq: Two times a day (BID) | NASAL | Status: DC
  Filled 2019-11-01: qty 30

## 2019-11-01 MED ORDER — DARBEPOETIN ALFA 200 MCG/0.4ML IJ SOSY: 200.0000 ug | PREFILLED_SYRINGE | INTRAMUSCULAR | Status: DC

## 2019-11-01 MED ORDER — FLUTICASONE PROPIONATE 50 MCG/ACT NA SUSP
2.0000 | Freq: Two times a day (BID) | NASAL | Status: DC
  Filled 2019-11-01: qty 16

## 2019-11-01 MED ORDER — GUAIFENESIN-DM 100-10 MG/5ML PO SYRP: 10.0000 mL | ORAL_SOLUTION | ORAL | Status: DC | PRN

## 2019-11-01 MED ORDER — SALINE SPRAY 0.65 % NA SOLN
1.0000 | Freq: Four times a day (QID) | NASAL | Status: DC | PRN
  Filled 2019-11-01: qty 44

## 2019-11-01 NOTE — Discharge Summary (Addendum)
Physician Discharge Summary  Jordan [name] [id number] DOB: 21-Apr-1966 DOA: 10/31/2019  PCP: Jearld Fenton, NP  Admit date: 10/31/2019 Discharge date: 11/01/2019  Admitted From: Home  Disposition:  Home   Recommendations for Outpatient Follow-up and new medication changes:  1. Follow up with Jearld Fenton NP in 7 days.   Home Health: no   Equipment/Devices: no    Discharge Condition: stable  CODE STATUS: full  Diet recommendation: heart healthy and renal prudent.   Brief/Interim Summary:  Patient was admitted to the hospital with the working diagnosis of volume overload with pulmonary edema due to end-stage renal disease noncompliant with hemodialysis.  54 year old male who presented with dyspnea. He does have significant past medical history for end-stage renal disease on hemodialysis (M/W/F),hypertension and chronic anemia. Patient did not have a full dialysis session about 5 days ago, he missed his last hemodialysis session. He presented with persistent and severe dyspnea. On his initial physical examination he was in respiratory distress and placed on a BiPAP. Pressure 155/92, heart rate 103, respiratory rate 19, oxygen saturation 99%, lungs clear to auscultation bilaterally, heart S1-S2 present rhythm, soft abdomen, no lower extremity edema. Sodium 144, potassium 5.6, chloride 104, bicarb 18, glucose 94, BUN 100, creatinine 17, calcium 8.7, white count 9.1, hemoglobin 8.6, hematocrit 39.2, platelet 128.  Chest radiograph with cardiomegaly, bilateral interstitial infiltrates with vascular hilar congestion bilaterally.  EKG 117 bpm, normal axis, normal intervals, sinus rhythm, no ST segment or T wave changes.  Patient underwent emergent hemodialysis with ultrafiltration with improvement with his symptoms.   1. Acute hypoxic respiratory failure due to volume overload complicated with acute pulmonary edema due to ESRD/ non compliant with renal replacement therapy/  complicated with hyperkalemia. Patient had urgent HD yesterday with  ultrafiltration 3,120 ml. This am continue to have volume overload with positive JVD, pulmonary wheezing and rhonchi. Continue to need supplemental 02 per [place] to keep 02 saturation more than 88%. K this am 4,6.   Plan for ultrafiltration today, for the second consecutive day, to target a more negative fluid balance.     2. Uncontrolled HTN/ emergency. Blood pressure 154/90 mmHg, continue volume overload, for ultrafiltration today. Continue blood pressure monitoring.   3. Anemia of chronic renal disease. Hgb and Hct stable at 7.8. Check iron panel. No indication for prbc transfusion for Hgb more than 7. Continue with darbepoetine.   4. Metabolic bone disease. On calcium acetate and calcitriol.  5. Sinus congestion and hemoptysis. Patient this am with hemoptysis and post nasal dripping. Will hold on bipap for now as long as oxygen saturation more than 88%. Will add flonase, afrin and nasal saline spry, along with guaifenesin dm.    18:06.  Today after hemodialysis patient was told that he was probably stable to be discharged home.    On the telemetry ward he continues to have increased oxygen requirements, his oxygen saturation is low 90s on 2 L of supplemental oxygen.  He has dyspnea.  I explained him that it is best to stay in the hospital, receive supplemental oxygen, continue hemodialysis while inpatient to prevent complications including death.  Patient is adamant about leaving the hospital, he understand the consequences about leaving without completing his treatments.  He fully understands complications including worsening pulmonary edema, and death.  I explained to him in detail that I recommend him staying in the hospital, but he has decided to leave the hospital [facility].  I offer him interventions to make his hospital  stay more tolerable but he has decided to go home.    Discharge Diagnoses:   Principal Problem:   Acute pulmonary edema (HCC) Active Problems:   ESRD on dialysis [facility])   Essential hypertension   Anemia in ESRD (end-stage renal disease) ([place])   Pruritus    Discharge Instructions   Allergies as of 11/01/2019   No Known Allergies     Medication List    TAKE these medications   albuterol 108 (90 Base) MCG/ACT inhaler Commonly known as: VENTOLIN HFA Inhale 2 puffs into the lungs every 4 (four) hours as needed for wheezing or shortness of breath.   calcitRIOL 0.5 MCG capsule Commonly known as: ROCALTROL Take 1 capsule (0.5 mcg total) by mouth every Monday, Wednesday, and Friday with hemodialysis.   calcium acetate 667 MG capsule Commonly known as: PHOSLO Take 2 capsules (1,334 mg total) by mouth 3 (three) times daily with meals.   hydrOXYzine 50 MG tablet Commonly known as: ATARAX/VISTARIL Take 50 mg by mouth 3 (three) times daily.   nicotine 14 mg/24hr patch Commonly known as: NICODERM CQ - dosed in mg/24 hours Place 1 patch (14 mg total) onto the skin daily.   sildenafil 50 MG tablet Commonly known as: VIAGRA Take 50 mg by mouth daily as needed for erectile dysfunction.   triamcinolone 0.025 % ointment Commonly known as: KENALOG Apply 1 application topically 2 (two) times daily.       No Known Allergies  Consultations:  Nephrology    Procedures/Studies: DG CHEST PORT 1 VIEW  Result Date: 11/01/2019 CLINICAL DATA:  Shortness of breath. Pulmonary edema. EXAM: PORTABLE CHEST 1 VIEW COMPARISON:  10/31/2019 and 10/22/2019 FINDINGS: The diffuse bilateral pulmonary edema has improved. Heart size is normal considering the AP portable technique. Slight pulmonary vascular prominence. Aortic atherosclerosis. No effusions. No bone abnormality. IMPRESSION: Improving pulmonary edema. Aortic Atherosclerosis (ICD10-I70.0). Electronically Signed   By: Lorriane Shire M.D.   On: 11/01/2019 10:21   DG Chest Portable 1 View  Result Date:  10/31/2019 CLINICAL DATA:  Shortness of breath and chest pain. EXAM: PORTABLE CHEST 1 VIEW COMPARISON:  10/22/2019 FINDINGS: Artifact overlies the chest. The heart is enlarged. There is aortic atherosclerosis. Widespread opacity likely reflects interstitial and alveolar pulmonary edema. No visible effusion at this time. No acute bone finding. IMPRESSION: Acute pulmonary edema. Electronically Signed   By: Nelson Chimes M.D.   On: 10/31/2019 03:41   DG Chest Portable 1 View  Result Date: 10/22/2019 CLINICAL DATA:  Initial evaluation for acute shortness of breath. EXAM: PORTABLE CHEST 1 VIEW COMPARISON:  Prior radiograph from 10/05/2019. FINDINGS: Cardiomegaly, stable. Mediastinal silhouette within normal limits. Aortic atherosclerosis. Lungs normally inflated. Diffuse vascular congestion with interstitial prominence, suggesting pulmonary interstitial edema. No consolidative opacity. No pleural effusion. No pneumothorax. No acute osseous finding. IMPRESSION: 1. Cardiomegaly with diffuse pulmonary vascular congestion and interstitial prominence, suggesting pulmonary interstitial edema. 2.  Aortic Atherosclerosis (ICD10-I70.0). Electronically Signed   By: Jeannine Boga M.D.   On: 10/22/2019 04:26   DG Chest Port 1 View  Result Date: 10/05/2019 CLINICAL DATA:  Short of breath EXAM: PORTABLE CHEST 1 VIEW COMPARISON:  10/01/2019, CT 07/22/2019, 07/22/2019, 02/18/2019 FINDINGS: Stable mild cardiomegaly. Vascular calcification. Diffuse bilateral interstitial and ground-glass opacity.aortic atherosclerosis. No pneumothorax. IMPRESSION: Cardiomegaly with vascular congestion. Diffuse bilateral interstitial and ground-glass opacity, favor pulmonary edema. Electronically Signed   By: Donavan Foil M.D.   On: 10/05/2019 16:38      Procedures:   Subjective: Patient is feeling well,  back to his normal baseline, no dyspnea or chest pain, no nausea or vomiting.   Discharge Exam: Vitals:   11/01/19 1530 11/01/19  1543  BP: (!) 152/90 (!) 157/94  Pulse: (!) 101 99  Resp:  16  Temp:  98 F (36.7 C)  SpO2:  97%   Vitals:   11/01/19 1430 11/01/19 1500 11/01/19 1530 11/01/19 1543  BP: (!) 155/92 (!) 162/96 (!) 152/90 (!) 157/94  Pulse: (!) 101 (!) 105 (!) 101 99  Resp:    16  Temp:    98 F (36.7 C)  TempSrc:    Oral  SpO2:    97%  Weight:    69.9 kg    General: Not in pain or dyspnea  Neurology: Awake and alert, non focal  E ENT: no pallor, no icterus, oral mucosa moist Cardiovascular: No JVD. S1-S2 present, rhythmic, no gallops, rubs, or murmurs. No lower extremity edema. Pulmonary: positive breath sounds bilaterally, adequate air movement, no wheezing, rhonchi or rales. Gastrointestinal. Abdomen with no organomegaly, non tender, no rebound or guarding Skin. No rashes Musculoskeletal: no joint deformities   The results of significant diagnostics from this hospitalization (including imaging, microbiology, ancillary and laboratory) are listed below for reference.     Microbiology: Recent Results (from the past 240 hour(s))  Culture, blood (routine x 2)     Status: None   Collection Time: 10/23/19 12:30 PM   Specimen: BLOOD RIGHT ARM  Result Value Ref Range Status   Specimen Description BLOOD RIGHT ARM  Final   Special Requests   Final    BOTTLES DRAWN AEROBIC AND ANAEROBIC Blood Culture results may not be optimal due to an inadequate volume of blood received in culture bottles   Culture   Final    NO GROWTH 5 DAYS Performed at [facility], [place] [address] [place], [place] [address]    Report Status 10/28/2019 FINAL  Final  Culture, blood (routine x 2)     Status: None   Collection Time: 10/23/19 12:39 PM   Specimen: BLOOD  Result Value Ref Range Status   Specimen Description BLOOD RIGHT ANTECUBITAL  Final   Special Requests   Final    BOTTLES DRAWN AEROBIC AND ANAEROBIC Blood Culture adequate volume   Culture   Final    NO GROWTH 5 DAYS Performed at [facility], [place] [address], [place], [place] [address]    Report Status 10/28/2019 FINAL  Final  Respiratory Panel by RT PCR (Flu A&B, Covid) - Nasopharyngeal Swab     Status: None   Collection Time: 10/31/19  3:00 AM   Specimen: Nasopharyngeal Swab  Result Value Ref Range Status   SARS Coronavirus 2 by RT PCR NEGATIVE NEGATIVE Final    Comment: (NOTE) SARS-CoV-2 target nucleic acids are NOT DETECTED. The SARS-CoV-2 RNA is generally detectable in upper respiratoy specimens during the acute phase of infection. The lowest concentration of SARS-CoV-2 viral copies this assay can detect is 131 copies/mL. A negative result does not preclude SARS-Cov-2 infection and should not be used as the sole basis for treatment or other patient management decisions. A negative result may occur with  improper specimen collection/handling, submission of specimen other than nasopharyngeal swab, presence of viral mutation(s) within the areas targeted by this assay, and inadequate number of viral copies (<131 copies/mL). A negative result must be combined with clinical observations, patient history, and epidemiological information. The expected result is Negative. Fact Sheet for Patients:  PinkCheek.be Fact  Sheet for Healthcare Providers:  GravelBags.it This test is not yet ap proved or cleared by the [place] FDA and  has been authorized for detection and/or diagnosis of SARS-CoV-2 by FDA under an Emergency Use Authorization (EUA). This EUA will remain  in effect (meaning this test can be used) for the duration of the COVID-19 declaration under Section 564(b)(1) of the Act, 21 U.S.C. section 360bbb-3(b)(1), unless the authorization is terminated or revoked sooner.    Influenza A by PCR NEGATIVE NEGATIVE Final   Influenza B by PCR NEGATIVE NEGATIVE Final    Comment: (NOTE) The Xpert Xpress SARS-CoV-2/FLU/RSV assay is intended as an aid in  the  diagnosis of influenza from Nasopharyngeal swab specimens and  should not be used as a sole basis for treatment. Nasal washings and  aspirates are unacceptable for Xpert Xpress SARS-CoV-2/FLU/RSV  testing. Fact Sheet for Patients: PinkCheek.be Fact Sheet for Healthcare Providers: GravelBags.it This test is not yet approved or cleared by the [place] FDA and  has been authorized for detection and/or diagnosis of SARS-CoV-2 by  FDA under an Emergency Use Authorization (EUA). This EUA will remain  in effect (meaning this test can be used) for the duration of the  Covid-19 declaration under Section 564(b)(1) of the Act, 21  U.S.C. section 360bbb-3(b)(1), unless the authorization is  terminated or revoked. Performed at [facility], [place] [address] [place], [place] [address]      Labs: BNP (last 3 results) No results for input(s): BNP in the last 8760 hours. Basic Metabolic Panel: Recent Labs  Lab 10/31/19 0256 10/31/19 0256 10/31/19 0301 10/31/19 0359 10/31/19 0630 10/31/19 1000 10/31/19 1637 11/01/19 0318  NA 144  --  139 139  --  141  --  140  K 5.6*   < > 5.3* 5.2*  --  6.1* 4.0 4.6  CL 104  --  107  --   --  103  --  100  CO2 18*  --   --   --   --  19*  --  24  GLUCOSE 94  --  93  --   --  84  --  105*  BUN 100*  --  103*  --   --  105*  --  64*  CREATININE 17.41*  --  >18.00*  --  17.21* 18.25*  --  12.42*  CALCIUM 8.7*  --   --   --   --  8.6*  --  8.4*  MG 2.1  --   --   --   --   --   --   --   PHOS  --   --   --   --   --  7.1*  --   --    < > = values in this interval not displayed.   Liver Function Tests: Recent Labs  Lab 10/31/19 0256 10/31/19 1000  AST 18  --   ALT 15  --   ALKPHOS 146*  --   BILITOT 1.8*  --   PROT 6.9  --   ALBUMIN 3.4* 3.2*   No results for input(s): LIPASE, AMYLASE in the last 168 hours. No results for input(s): AMMONIA in the last 168 hours. CBC: Recent Labs   Lab 10/31/19 0256 10/31/19 0256 10/31/19 0301 10/31/19 0359 10/31/19 0630 10/31/19 1000 11/01/19 1128  WBC 9.1  --   --   --  8.4 9.0 7.1  NEUTROABS 7.9*  --   --   --   --   --   --  HGB 8.6*   < > 8.8* 8.8* 7.4* 7.6* 7.8*  HCT 29.2*   < > 26.0* 26.0* 24.3* 25.1* 25.4*  MCV 116.3*  --   --   --  115.2* 113.6* 111.9*  PLT 128*  --   --   --  162 124* 125*   < > = values in this interval not displayed.   Cardiac Enzymes: No results for input(s): CKTOTAL, CKMB, CKMBINDEX, TROPONINI in the last 168 hours. BNP: Invalid input(s): POCBNP CBG: No results for input(s): GLUCAP in the last 168 hours. D-Dimer No results for input(s): DDIMER in the last 72 hours. Hgb A1c No results for input(s): HGBA1C in the last 72 hours. Lipid Profile No results for input(s): CHOL, HDL, LDLCALC, TRIG, CHOLHDL, LDLDIRECT in the last 72 hours. Thyroid function studies No results for input(s): TSH, T4TOTAL, T3FREE, THYROIDAB in the last 72 hours.  Invalid input(s): FREET3 Anemia work up No results for input(s): VITAMINB12, FOLATE, FERRITIN, TIBC, IRON, RETICCTPCT in the last 72 hours. Urinalysis    Component Value Date/Time   COLORURINE STRAW (A) 08/04/2016 0448   APPEARANCEUR CLOUDY (A) 08/04/2016 0448   LABSPEC 1.011 08/04/2016 0448   PHURINE 5.5 08/04/2016 0448   GLUCOSEU 100 (A) 08/04/2016 0448   HGBUR SMALL (A) 08/04/2016 0448   BILIRUBINUR NEGATIVE 08/04/2016 0448   KETONESUR NEGATIVE 08/04/2016 0448   PROTEINUR >300 (A) 08/04/2016 0448   NITRITE NEGATIVE 08/04/2016 0448   LEUKOCYTESUR NEGATIVE 08/04/2016 0448   Sepsis Labs Invalid input(s): PROCALCITONIN,  WBC,  LACTICIDVEN Microbiology Recent Results (from the past 240 hour(s))  Culture, blood (routine x 2)     Status: None   Collection Time: 10/23/19 12:30 PM   Specimen: BLOOD RIGHT ARM  Result Value Ref Range Status   Specimen Description BLOOD RIGHT ARM  Final   Special Requests   Final    BOTTLES DRAWN AEROBIC AND  ANAEROBIC Blood Culture results may not be optimal due to an inadequate volume of blood received in culture bottles   Culture   Final    NO GROWTH 5 DAYS Performed at [facility], [place] [address], [place], [place] [address]    Report Status 10/28/2019 FINAL  Final  Culture, blood (routine x 2)     Status: None   Collection Time: 10/23/19 12:39 PM   Specimen: BLOOD  Result Value Ref Range Status   Specimen Description BLOOD RIGHT ANTECUBITAL  Final   Special Requests   Final    BOTTLES DRAWN AEROBIC AND ANAEROBIC Blood Culture adequate volume   Culture   Final    NO GROWTH 5 DAYS Performed at [facility], [place] [address] [place], [place] [address]    Report Status 10/28/2019 FINAL  Final  Respiratory Panel by RT PCR (Flu A&B, Covid) - Nasopharyngeal Swab     Status: None   Collection Time: 10/31/19  3:00 AM   Specimen: Nasopharyngeal Swab  Result Value Ref Range Status   SARS Coronavirus 2 by RT PCR NEGATIVE NEGATIVE Final    Comment: (NOTE) SARS-CoV-2 target nucleic acids are NOT DETECTED. The SARS-CoV-2 RNA is generally detectable in upper respiratoy specimens during the acute phase of infection. The lowest concentration of SARS-CoV-2 viral copies this assay can detect is 131 copies/mL. A negative result does not preclude SARS-Cov-2 infection and should not be used as the sole basis for treatment or other patient management decisions. A negative result may occur with  improper specimen collection/handling, submission of specimen other  than nasopharyngeal swab, presence of viral mutation(s) within the areas targeted by this assay, and inadequate number of viral copies (<131 copies/mL). A negative result must be combined with clinical observations, patient history, and epidemiological information. The expected result is Negative. Fact Sheet for Patients:  PinkCheek.be Fact Sheet for Healthcare Providers:   GravelBags.it This test is not yet ap proved or cleared by the [place] FDA and  has been authorized for detection and/or diagnosis of SARS-CoV-2 by FDA under an Emergency Use Authorization (EUA). This EUA will remain  in effect (meaning this test can be used) for the duration of the COVID-19 declaration under Section 564(b)(1) of the Act, 21 U.S.C. section 360bbb-3(b)(1), unless the authorization is terminated or revoked sooner.    Influenza A by PCR NEGATIVE NEGATIVE Final   Influenza B by PCR NEGATIVE NEGATIVE Final    Comment: (NOTE) The Xpert Xpress SARS-CoV-2/FLU/RSV assay is intended as an aid in  the diagnosis of influenza from Nasopharyngeal swab specimens and  should not be used as a sole basis for treatment. Nasal washings and  aspirates are unacceptable for Xpert Xpress SARS-CoV-2/FLU/RSV  testing. Fact Sheet for Patients: PinkCheek.be Fact Sheet for Healthcare Providers: GravelBags.it This test is not yet approved or cleared by the [place] FDA and  has been authorized for detection and/or diagnosis of SARS-CoV-2 by  FDA under an Emergency Use Authorization (EUA). This EUA will remain  in effect (meaning this test can be used) for the duration of the  Covid-19 declaration under Section 564(b)(1) of the Act, 21  U.S.C. section 360bbb-3(b)(1), unless the authorization is  terminated or revoked. Performed at [facility], [place] [address], [place], [place] [address]      Time coordinating discharge: 45 minutes  SIGNED:   Tawni Millers, MD  [facility] 11/01/2019, 4:58 PM

## 2019-11-01 NOTE — Progress Notes (Signed)
Patient returned from dialysis stating a doctor told him he could go home. Paged MD, MD states patient can go home if he is able to be weaned off O2. When patient was taken off O2 he desated to 79%. Placed patient back on 2L [place], sating 84-95% on [place]. Patient then coughed up bright red tinged sputum and threw up his dinner. Explained the risks of patient going home at this moment. Patient still wants to go home. Dr. Cathlean Sauer notified of patient's condition and him wanting to go home. MD came to bedside and spoke with patient. Patient decided to leave the hospital AMA.

## 2019-11-01 NOTE — Progress Notes (Signed)
Pt has been having nasal drainage (down back of throat) throughout the night. Also, complaining about the O2- he coughed up a small amount of brown/red tinged sputum- (not sure if it is nasal drainage or not). I will hand this off to the day shift RN-

## 2019-11-01 NOTE — Progress Notes (Signed)
Subjective:  HD yest with 3300 ml uf , still cos sob , 96 O2 sat [place] O2, coughing up some blood tinged sputum ,Not in distress , walking to bathroom now , ,obtain stat cxr   Objective Vital signs in last 24 hours: Vitals:   10/31/19 1956 10/31/19 2042 11/01/19 0639 11/01/19 0958  BP:  (!) 146/88 (!) 165/98   Pulse:  (!) 109 (!) 109 (!) (P) 108  Resp: 18   (P) 18  Temp:  99.1 F (37.3 C) 97.6 F (36.4 C)   TempSrc:  Oral Oral   SpO2:  99% 95% (!) (P) 89%  Weight:       Weight change:    Physical Exam: General:alert  NAD, but concern with coughing  some blood tinged sputum  Heart:Reg tachy , no rub  Lungs:faint crackles R base, unlabored breathing currently walking to BR  Abdomen:soft NT, ND Extremities:no sig LE edema Dialysis Access:Left upper AVF + bruit   OP Dialysis Orders: [facility] MWF 4h 68kg 2/2 bath Hep none LUA AVF  Mircera 225 mcg last 10/19/19 Calcitriol 0.86mcg po q hd  Parsabiv  5 mg po qhd   Problem/Plan: 1.SOB/ pulmonary edema - missed 1.5 HD sessions last week. Well over dry wt,HD admit 02/08= 3300 ml uf  Still  3 kg > edw with sob and cough hemoptysis  check CXR stat , may need more hd today / currently not hypoxic on o2 sat  [place] O2/ Needs better OP HD compliance / second admit past month  Missing hd    2. ESRD-MWF - HD yest and cxr today fu   3. Anemia-hgb 7.6 cont Aranesp next tx  4. Secondary hyperparathyroidism-cont meds 5.HTN/volume- did not get him below edw on recent admit/ no op bp meds  bp admit improved with hd yest  6. Nutrition- renal diet with fluid restriction 7. Dialysis noncompliance  Ernest Haber, PA-C [facility] [phone] 11/01/2019,9:59 AM  LOS: 1 day   Labs: Basic Metabolic Panel: Recent Labs  Lab 10/31/19 0256 10/31/19 0256 10/31/19 0301 10/31/19 0301 10/31/19 0359 10/31/19 0359 10/31/19 0630 10/31/19 1000 10/31/19 1637 11/01/19 0318  NA 144   < > 139   < > 139  --   --   141  --  140  K 5.6*   < > 5.3*   < > 5.2*   < >  --  6.1* 4.0 4.6  CL 104   < > 107  --   --   --   --  103  --  100  CO2 18*  --   --   --   --   --   --  19*  --  24  GLUCOSE 94   < > 93  --   --   --   --  84  --  105*  BUN 100*   < > 103*  --   --   --   --  105*  --  64*  CREATININE 17.41*   < > >18.00*   < >  --   --  17.21* 18.25*  --  12.42*  CALCIUM 8.7*  --   --   --   --   --   --  8.6*  --  8.4*  PHOS  --   --   --   --   --   --   --  7.1*  --   --    < > =  values in this interval not displayed.   Liver Function Tests: Recent Labs  Lab 10/25/19 1107 10/31/19 0256 10/31/19 1000  AST 19 18  --   ALT 19 15  --   ALKPHOS 176* 146*  --   BILITOT 1.5* 1.8*  --   PROT 7.5 6.9  --   ALBUMIN 3.7 3.4* 3.2*   No results for input(s): LIPASE, AMYLASE in the last 168 hours. No results for input(s): AMMONIA in the last 168 hours. CBC: Recent Labs  Lab 10/25/19 1107 10/25/19 1107 10/31/19 0256 10/31/19 0301 10/31/19 0359 10/31/19 0630 10/31/19 1000  WBC 8.3   < > 9.1  --   --  8.4 9.0  NEUTROABS 6.4  --  7.9*  --   --   --   --   HGB 9.0*   < > 8.6*   < > 8.8* 7.4* 7.6*  HCT 30.4*   < > 29.2*   < > 26.0* 24.3* 25.1*  MCV 114.7*  --  116.3*  --   --  115.2* 113.6*  PLT 127*   < > 128*  --   --  162 124*   < > = values in this interval not displayed.   Cardiac Enzymes: No results for input(s): CKTOTAL, CKMB, CKMBINDEX, TROPONINI in the last 168 hours. CBG: No results for input(s): GLUCAP in the last 168 hours.  Studies/Results: DG Chest Portable 1 View  Result Date: 10/31/2019 CLINICAL DATA:  Shortness of breath and chest pain. EXAM: PORTABLE CHEST 1 VIEW COMPARISON:  10/22/2019 FINDINGS: Artifact overlies the chest. The heart is enlarged. There is aortic atherosclerosis. Widespread opacity likely reflects interstitial and alveolar pulmonary edema. No visible effusion at this time. No acute bone finding. IMPRESSION: Acute pulmonary edema. Electronically Signed   By:  Nelson Chimes M.D.   On: 10/31/2019 03:41   Medications:  . calcitRIOL  0.5 mcg Oral Q M,W,F-HD  . calcium acetate  1,334 mg Oral TID WC  . heparin  5,000 Units Subcutaneous Q8H  . hydrocortisone cream   Topical BID

## 2019-11-01 NOTE — Progress Notes (Signed)
Patient refuses to wear Bipap, but it is contraindicated for patient coughing up blood. Consulted the physician to come assess the patient for decompensation and for coughing up blood.

## 2019-11-01 NOTE — Progress Notes (Signed)
PROGRESS NOTE    Jordan [name]  [id number] DOB: Apr 15, 1966 DOA: 10/31/2019 PCP: Jearld Fenton, NP    Brief Narrative:   Patient was admitted to the hospital with the working diagnosis of volume overload with pulmonary edema due to end-stage renal disease noncompliant with hemodialysis.  54 year old male who presented with dyspnea.  He does have significant past medical history for end-stage renal disease on hemodialysis (M/W/F), hypertension and chronic anemia.  Patient did not have a full dialysis session about 5 days ago, he missed his last hemodialysis session.  He presented with persistent and severe dyspnea.  On his initial physical examination he was in respiratory distress and placed on a BiPAP.  Pressure 155/92, heart rate 103, respiratory rate 19, oxygen saturation 99%, lungs clear to auscultation bilaterally, heart S1-S2 present rhythm, soft abdomen, no lower extremity edema.  Sodium 144, potassium 5.6, chloride 104, bicarb 18, glucose 94, BUN 100, creatinine 17, calcium 8.7, white count 9.1, hemoglobin 8.6, hematocrit 39.2, platelet 128.  Chest radiograph with cardiomegaly, bilateral interstitial infiltrates with vascular hilar congestion bilaterally.  Patient underwent emergent hemodialysis with ultrafiltration with improvement with his symptoms.   Plan to continue ultrafiltration until euvolemia is achieved.   Assessment & Plan:   Principal Problem:   Acute pulmonary edema (HCC) Active Problems:   ESRD on dialysis [facility])   Essential hypertension   Anemia in ESRD (end-stage renal disease) (HCC)   Pruritus   1. Acute hypoxic respiratory failure due to volume overload complicated with acute pulmonary edema due to ESRD/ non compliant with renal replacement therapy/ complicated with hyperkalemia. Patient had urgent HD yesterday with  ultrafiltration 3,120 ml. This am continue to have volume overload with positive JVD, pulmonary wheezing and rhonchi. Continue to need  supplemental 02 per [place] to keep 02 saturation more than 88%. K this am 4,6.   Plan for ultrafiltration today, for the second consecutive day, to target a more negative fluid balance.    Patient continue to be at high risk for worsening respiratory failure.   2. Uncontrolled HTN/ emergency. Blood pressure 154/90 mmHg, continue volume overload, for ultrafiltration today. Continue blood pressure monitoring.   3. Anemia of chronic renal disease. Hgb and Hct stable at 7.8. Check iron panel. No indication for prbc transfusion for Hgb more than 7. Continue with darbepoetine.   4. Metabolic bone disease. On calcium acetate and calcitriol.   5. Sinus congestion and hemoptysis. Patient this am with hemoptysis and post nasal dripping. Will hold on bipap for now as long as oxygen saturation more than 88%. Will add flonase, afrin and nasal saline spry, along with guaifenesin dm.    DVT prophylaxis: heparin   Code Status: full Family Communication: no family at the bedside  Disposition Plan/ discharge barriers: patient continue critically ill.   Consultants:    nephrology     Subjective: Patient with positive cough, postnasal dripping and hemoptysis this am, not able to sleep well last night. No nausea or vomiting. Dyspnea has improved but not yet back to baseline.   Objective: Vitals:   10/31/19 1956 10/31/19 2042 11/01/19 0639 11/01/19 0958  BP:  (!) 146/88 (!) 165/98   Pulse:  (!) 109 (!) 109 (!) 108  Resp: 18   18  Temp:  99.1 F (37.3 C) 97.6 F (36.4 C)   TempSrc:  Oral Oral   SpO2:  99% 95% (!) 89%  Weight:        Intake/Output Summary (Last 24 hours) at 11/01/2019 1056  Last data filed at 10/31/2019 2030 Gross per 24 hour  Intake 780 ml  Output 3300 ml  Net -2520 ml   Filed Weights   10/31/19 0940 10/31/19 1254  Weight: 74.1 kg 71 kg    Examination:   General: Not in pain or dyspnea, deconditioned  Neurology: Awake and alert, non focal  E ENT: no pallor, no  icterus, oral mucosa moist. Mild facial and periorbital edema.  Cardiovascular: Positive JVD. S1-S2 present, rhythmic, no gallops, rubs, or murmurs. No significant lower extremity edema. Pulmonary: positive breath sounds bilaterally, adequate air movement, positive bilateral wheezing and scattered rhonchi, with no rales. Gastrointestinal. Abdomen with, no organomegaly, non tender, no rebound or guarding Skin. No rashes Musculoskeletal: no joint deformities     Data Reviewed: I have personally reviewed following labs and imaging studies  CBC: Recent Labs  Lab 10/25/19 1107 10/25/19 1107 10/31/19 0256 10/31/19 0301 10/31/19 0359 10/31/19 0630 10/31/19 1000  WBC 8.3  --  9.1  --   --  8.4 9.0  NEUTROABS 6.4  --  7.9*  --   --   --   --   HGB 9.0*   < > 8.6* 8.8* 8.8* 7.4* 7.6*  HCT 30.4*   < > 29.2* 26.0* 26.0* 24.3* 25.1*  MCV 114.7*  --  116.3*  --   --  115.2* 113.6*  PLT 127*  --  128*  --   --  162 124*   < > = values in this interval not displayed.   Basic Metabolic Panel: Recent Labs  Lab 10/25/19 1107 10/25/19 1107 10/31/19 0256 10/31/19 0256 10/31/19 0301 10/31/19 0359 10/31/19 0630 10/31/19 1000 10/31/19 1637 11/01/19 0318  NA 138   < > 144  --  139 139  --  141  --  140  K 4.4   < > 5.6*   < > 5.3* 5.2*  --  6.1* 4.0 4.6  CL 97*  --  104  --  107  --   --  103  --  100  CO2 22  --  18*  --   --   --   --  19*  --  24  GLUCOSE 101*  --  94  --  93  --   --  84  --  105*  BUN 74*  --  100*  --  103*  --   --  105*  --  64*  CREATININE 12.17*   < > 17.41*  --  >18.00*  --  17.21* 18.25*  --  12.42*  CALCIUM 8.6*  --  8.7*  --   --   --   --  8.6*  --  8.4*  MG  --   --  2.1  --   --   --   --   --   --   --   PHOS  --   --   --   --   --   --   --  7.1*  --   --    < > = values in this interval not displayed.   GFR: Estimated Creatinine Clearance: 5.8 mL/min (A) (by C-G formula based on SCr of 12.42 mg/dL (H)). Liver Function Tests: Recent Labs  Lab  10/25/19 1107 10/31/19 0256 10/31/19 1000  AST 19 18  --   ALT 19 15  --   ALKPHOS 176* 146*  --   BILITOT 1.5* 1.8*  --   PROT 7.5  6.9  --   ALBUMIN 3.7 3.4* 3.2*   No results for input(s): LIPASE, AMYLASE in the last 168 hours. No results for input(s): AMMONIA in the last 168 hours. Coagulation Profile: No results for input(s): INR, PROTIME in the last 168 hours. Cardiac Enzymes: No results for input(s): CKTOTAL, CKMB, CKMBINDEX, TROPONINI in the last 168 hours. BNP (last 3 results) No results for input(s): PROBNP in the last 8760 hours. HbA1C: No results for input(s): HGBA1C in the last 72 hours. CBG: No results for input(s): GLUCAP in the last 168 hours. Lipid Profile: No results for input(s): CHOL, HDL, LDLCALC, TRIG, CHOLHDL, LDLDIRECT in the last 72 hours. Thyroid Function Tests: No results for input(s): TSH, T4TOTAL, FREET4, T3FREE, THYROIDAB in the last 72 hours. Anemia Panel: No results for input(s): VITAMINB12, FOLATE, FERRITIN, TIBC, IRON, RETICCTPCT in the last 72 hours.    Radiology Studies: I have reviewed all of the imaging during this hospital visit personally     Scheduled Meds: . calcitRIOL  0.5 mcg Oral Q M,W,F-HD  . calcium acetate  1,334 mg Oral TID WC  . fluticasone  2 spray Each Nare BID  . heparin  5,000 Units Subcutaneous Q8H  . hydrocortisone cream   Topical BID  . oxymetazoline  1 spray Each Nare BID   Continuous Infusions:   LOS: 1 day        Andrian Urbach Gerome Apley, MD

## 2019-11-02 ENCOUNTER — Telehealth: Payer: Self-pay

## 2019-11-02 NOTE — Telephone Encounter (Signed)
He should follow up with nephrology.

## 2019-11-02 NOTE — Telephone Encounter (Signed)
Patient doesn't qualify for TCM due to ESRD on hemodialysis. Please forward to appointment staff for follow up if needed.

## 2019-11-04 DIAGNOSIS — N186 End stage renal disease: Secondary | ICD-10-CM | POA: Diagnosis not present

## 2019-11-04 DIAGNOSIS — D631 Anemia in chronic kidney disease: Secondary | ICD-10-CM | POA: Diagnosis not present

## 2019-11-04 DIAGNOSIS — N2581 Secondary hyperparathyroidism of renal origin: Secondary | ICD-10-CM | POA: Diagnosis not present

## 2019-11-04 DIAGNOSIS — L299 Pruritus, unspecified: Secondary | ICD-10-CM | POA: Diagnosis not present

## 2019-11-04 DIAGNOSIS — R52 Pain, unspecified: Secondary | ICD-10-CM | POA: Diagnosis not present

## 2019-11-04 DIAGNOSIS — Z992 Dependence on renal dialysis: Secondary | ICD-10-CM | POA: Diagnosis not present

## 2019-11-07 DIAGNOSIS — R52 Pain, unspecified: Secondary | ICD-10-CM | POA: Diagnosis not present

## 2019-11-07 DIAGNOSIS — L299 Pruritus, unspecified: Secondary | ICD-10-CM | POA: Diagnosis not present

## 2019-11-07 DIAGNOSIS — N2581 Secondary hyperparathyroidism of renal origin: Secondary | ICD-10-CM | POA: Diagnosis not present

## 2019-11-07 DIAGNOSIS — D631 Anemia in chronic kidney disease: Secondary | ICD-10-CM | POA: Diagnosis not present

## 2019-11-07 DIAGNOSIS — N186 End stage renal disease: Secondary | ICD-10-CM | POA: Diagnosis not present

## 2019-11-07 DIAGNOSIS — Z992 Dependence on renal dialysis: Secondary | ICD-10-CM | POA: Diagnosis not present

## 2019-11-08 ENCOUNTER — Other Ambulatory Visit: Payer: Self-pay

## 2019-11-08 ENCOUNTER — Encounter: Payer: Self-pay | Admitting: Oncology

## 2019-11-08 ENCOUNTER — Inpatient Hospital Stay: Payer: Medicare Other | Admitting: Oncology

## 2019-11-08 NOTE — Progress Notes (Unsigned)
Patient stated that he has been having difficulty breathing at nighttime. He also stated that he would contact his PCP to let him know because he had been afraid to sleep due to dying on his sleep for not breathing well. Patient would like to know his lab results and recommendations.

## 2019-11-09 ENCOUNTER — Encounter: Payer: Self-pay | Admitting: Oncology

## 2019-11-09 ENCOUNTER — Other Ambulatory Visit: Payer: Self-pay

## 2019-11-09 DIAGNOSIS — N2581 Secondary hyperparathyroidism of renal origin: Secondary | ICD-10-CM | POA: Diagnosis not present

## 2019-11-09 DIAGNOSIS — L299 Pruritus, unspecified: Secondary | ICD-10-CM | POA: Diagnosis not present

## 2019-11-09 DIAGNOSIS — Z992 Dependence on renal dialysis: Secondary | ICD-10-CM | POA: Diagnosis not present

## 2019-11-09 DIAGNOSIS — N186 End stage renal disease: Secondary | ICD-10-CM | POA: Diagnosis not present

## 2019-11-09 DIAGNOSIS — D631 Anemia in chronic kidney disease: Secondary | ICD-10-CM | POA: Diagnosis not present

## 2019-11-09 DIAGNOSIS — R52 Pain, unspecified: Secondary | ICD-10-CM | POA: Diagnosis not present

## 2019-11-09 NOTE — Progress Notes (Unsigned)
Patient stated that he has been having difficulty breathing at nighttime. He also stated that he would contact his PCP to let him know because he had been afraid to sleep due to dying on his sleep for not breathing well. Patient would like to know his lab results and recommendations.

## 2019-11-10 ENCOUNTER — Inpatient Hospital Stay (HOSPITAL_BASED_OUTPATIENT_CLINIC_OR_DEPARTMENT_OTHER): Payer: Medicare Other | Admitting: Oncology

## 2019-11-10 ENCOUNTER — Inpatient Hospital Stay: Payer: Medicare Other | Admitting: Oncology

## 2019-11-10 ENCOUNTER — Encounter: Payer: Self-pay | Admitting: Oncology

## 2019-11-10 DIAGNOSIS — D631 Anemia in chronic kidney disease: Secondary | ICD-10-CM | POA: Diagnosis not present

## 2019-11-10 DIAGNOSIS — D649 Anemia, unspecified: Secondary | ICD-10-CM | POA: Diagnosis not present

## 2019-11-10 DIAGNOSIS — N189 Chronic kidney disease, unspecified: Secondary | ICD-10-CM

## 2019-11-10 NOTE — Progress Notes (Signed)
He gets to itching after his dialysis.

## 2019-11-11 ENCOUNTER — Other Ambulatory Visit: Payer: Self-pay

## 2019-11-11 ENCOUNTER — Observation Stay
Admission: EM | Admit: 2019-11-11 | Discharge: 2019-11-13 | Disposition: A | Discharge status: Home / Self Care | Payer: Medicare Other | Attending: Family Medicine | Admitting: Family Medicine

## 2019-11-11 ENCOUNTER — Encounter: Payer: Self-pay | Admitting: Emergency Medicine

## 2019-11-11 ENCOUNTER — Emergency Department: Payer: Medicare Other

## 2019-11-11 DIAGNOSIS — R042 Hemoptysis: Secondary | ICD-10-CM | POA: Diagnosis not present

## 2019-11-11 DIAGNOSIS — J9601 Acute respiratory failure with hypoxia: Secondary | ICD-10-CM | POA: Diagnosis not present

## 2019-11-11 DIAGNOSIS — N2581 Secondary hyperparathyroidism of renal origin: Secondary | ICD-10-CM | POA: Insufficient documentation

## 2019-11-11 DIAGNOSIS — R221 Localized swelling, mass and lump, neck: Secondary | ICD-10-CM | POA: Diagnosis not present

## 2019-11-11 DIAGNOSIS — F17210 Nicotine dependence, cigarettes, uncomplicated: Secondary | ICD-10-CM | POA: Diagnosis not present

## 2019-11-11 DIAGNOSIS — N186 End stage renal disease: Secondary | ICD-10-CM

## 2019-11-11 DIAGNOSIS — I132 Hypertensive heart and chronic kidney disease with heart failure and with stage 5 chronic kidney disease, or end stage renal disease: Secondary | ICD-10-CM | POA: Insufficient documentation

## 2019-11-11 DIAGNOSIS — R1310 Dysphagia, unspecified: Secondary | ICD-10-CM | POA: Diagnosis not present

## 2019-11-11 DIAGNOSIS — I10 Essential (primary) hypertension: Secondary | ICD-10-CM | POA: Diagnosis not present

## 2019-11-11 DIAGNOSIS — J439 Emphysema, unspecified: Secondary | ICD-10-CM

## 2019-11-11 DIAGNOSIS — R0902 Hypoxemia: Secondary | ICD-10-CM | POA: Diagnosis not present

## 2019-11-11 DIAGNOSIS — I700 Atherosclerosis of aorta: Secondary | ICD-10-CM

## 2019-11-11 DIAGNOSIS — D631 Anemia in chronic kidney disease: Secondary | ICD-10-CM | POA: Insufficient documentation

## 2019-11-11 DIAGNOSIS — I509 Heart failure, unspecified: Secondary | ICD-10-CM | POA: Insufficient documentation

## 2019-11-11 DIAGNOSIS — Z992 Dependence on renal dialysis: Secondary | ICD-10-CM | POA: Diagnosis not present

## 2019-11-11 DIAGNOSIS — N529 Male erectile dysfunction, unspecified: Secondary | ICD-10-CM | POA: Insufficient documentation

## 2019-11-11 DIAGNOSIS — R0602 Shortness of breath: Secondary | ICD-10-CM | POA: Diagnosis not present

## 2019-11-11 DIAGNOSIS — D696 Thrombocytopenia, unspecified: Secondary | ICD-10-CM | POA: Diagnosis not present

## 2019-11-11 DIAGNOSIS — R05 Cough: Secondary | ICD-10-CM | POA: Diagnosis not present

## 2019-11-11 DIAGNOSIS — L299 Pruritus, unspecified: Secondary | ICD-10-CM | POA: Insufficient documentation

## 2019-11-11 DIAGNOSIS — R52 Pain, unspecified: Secondary | ICD-10-CM | POA: Diagnosis not present

## 2019-11-11 DIAGNOSIS — D649 Anemia, unspecified: Secondary | ICD-10-CM

## 2019-11-11 DIAGNOSIS — Z20822 Contact with and (suspected) exposure to covid-19: Secondary | ICD-10-CM | POA: Insufficient documentation

## 2019-11-11 DIAGNOSIS — E8770 Fluid overload, unspecified: Secondary | ICD-10-CM | POA: Diagnosis not present

## 2019-11-11 DIAGNOSIS — I120 Hypertensive chronic kidney disease with stage 5 chronic kidney disease or end stage renal disease: Secondary | ICD-10-CM | POA: Diagnosis not present

## 2019-11-11 DIAGNOSIS — J9621 Acute and chronic respiratory failure with hypoxia: Secondary | ICD-10-CM

## 2019-11-11 DIAGNOSIS — J810 Acute pulmonary edema: Secondary | ICD-10-CM

## 2019-11-11 DIAGNOSIS — Z79899 Other long term (current) drug therapy: Secondary | ICD-10-CM | POA: Diagnosis not present

## 2019-11-11 LAB — CBC WITH DIFFERENTIAL/PLATELET
Abs Immature Granulocytes: 0.1 10*3/uL — ABNORMAL HIGH (ref 0.00–0.07)
Basophils Absolute: 0.1 10*3/uL (ref 0.0–0.1)
Basophils Relative: 1 %
Eosinophils Absolute: 0.4 10*3/uL (ref 0.0–0.5)
Eosinophils Relative: 5 %
HCT: 23.6 % — ABNORMAL LOW (ref 39.0–52.0)
Hemoglobin: 7.2 g/dL — ABNORMAL LOW (ref 13.0–17.0)
Immature Granulocytes: 1 %
Lymphocytes Relative: 10 %
Lymphs Abs: 0.8 10*3/uL (ref 0.7–4.0)
MCH: 33.6 pg (ref 26.0–34.0)
MCHC: 30.5 g/dL (ref 30.0–36.0)
MCV: 110.3 fL — ABNORMAL HIGH (ref 80.0–100.0)
Monocytes Absolute: 0.5 10*3/uL (ref 0.1–1.0)
Monocytes Relative: 6 %
Neutro Abs: 6.5 10*3/uL (ref 1.7–7.7)
Neutrophils Relative %: 77 %
Platelets: 142 10*3/uL — ABNORMAL LOW (ref 150–400)
RBC: 2.14 MIL/uL — ABNORMAL LOW (ref 4.22–5.81)
RDW: 15.9 % — ABNORMAL HIGH (ref 11.5–15.5)
WBC: 8.4 10*3/uL (ref 4.0–10.5)
nRBC: 0 % (ref 0.0–0.2)

## 2019-11-11 LAB — BASIC METABOLIC PANEL
Anion gap: 13 (ref 5–15)
BUN: 25 mg/dL — ABNORMAL HIGH (ref 6–20)
CO2: 32 mmol/L (ref 22–32)
Calcium: 8.6 mg/dL — ABNORMAL LOW (ref 8.9–10.3)
Chloride: 98 mmol/L (ref 98–111)
Creatinine, Ser: 6.87 mg/dL — ABNORMAL HIGH (ref 0.61–1.24)
GFR calc Af Amer: 10 mL/min — ABNORMAL LOW (ref >60)
GFR calc non Af Amer: 8 mL/min — ABNORMAL LOW (ref >60)
Glucose, Bld: 117 mg/dL — ABNORMAL HIGH (ref 70–99)
Potassium: 3.8 mmol/L (ref 3.5–5.1)
Sodium: 143 mmol/L (ref 135–145)

## 2019-11-11 IMAGING — CR DG CHEST 2V
2 series · 2 of 2 positions shown · non-contrast
Comparison: Chest radiograph dated [DATE].

CLINICAL DATA: 53-year-old male with shortness of breath.

EXAM:
CHEST - 2 VIEW

[chest pa]
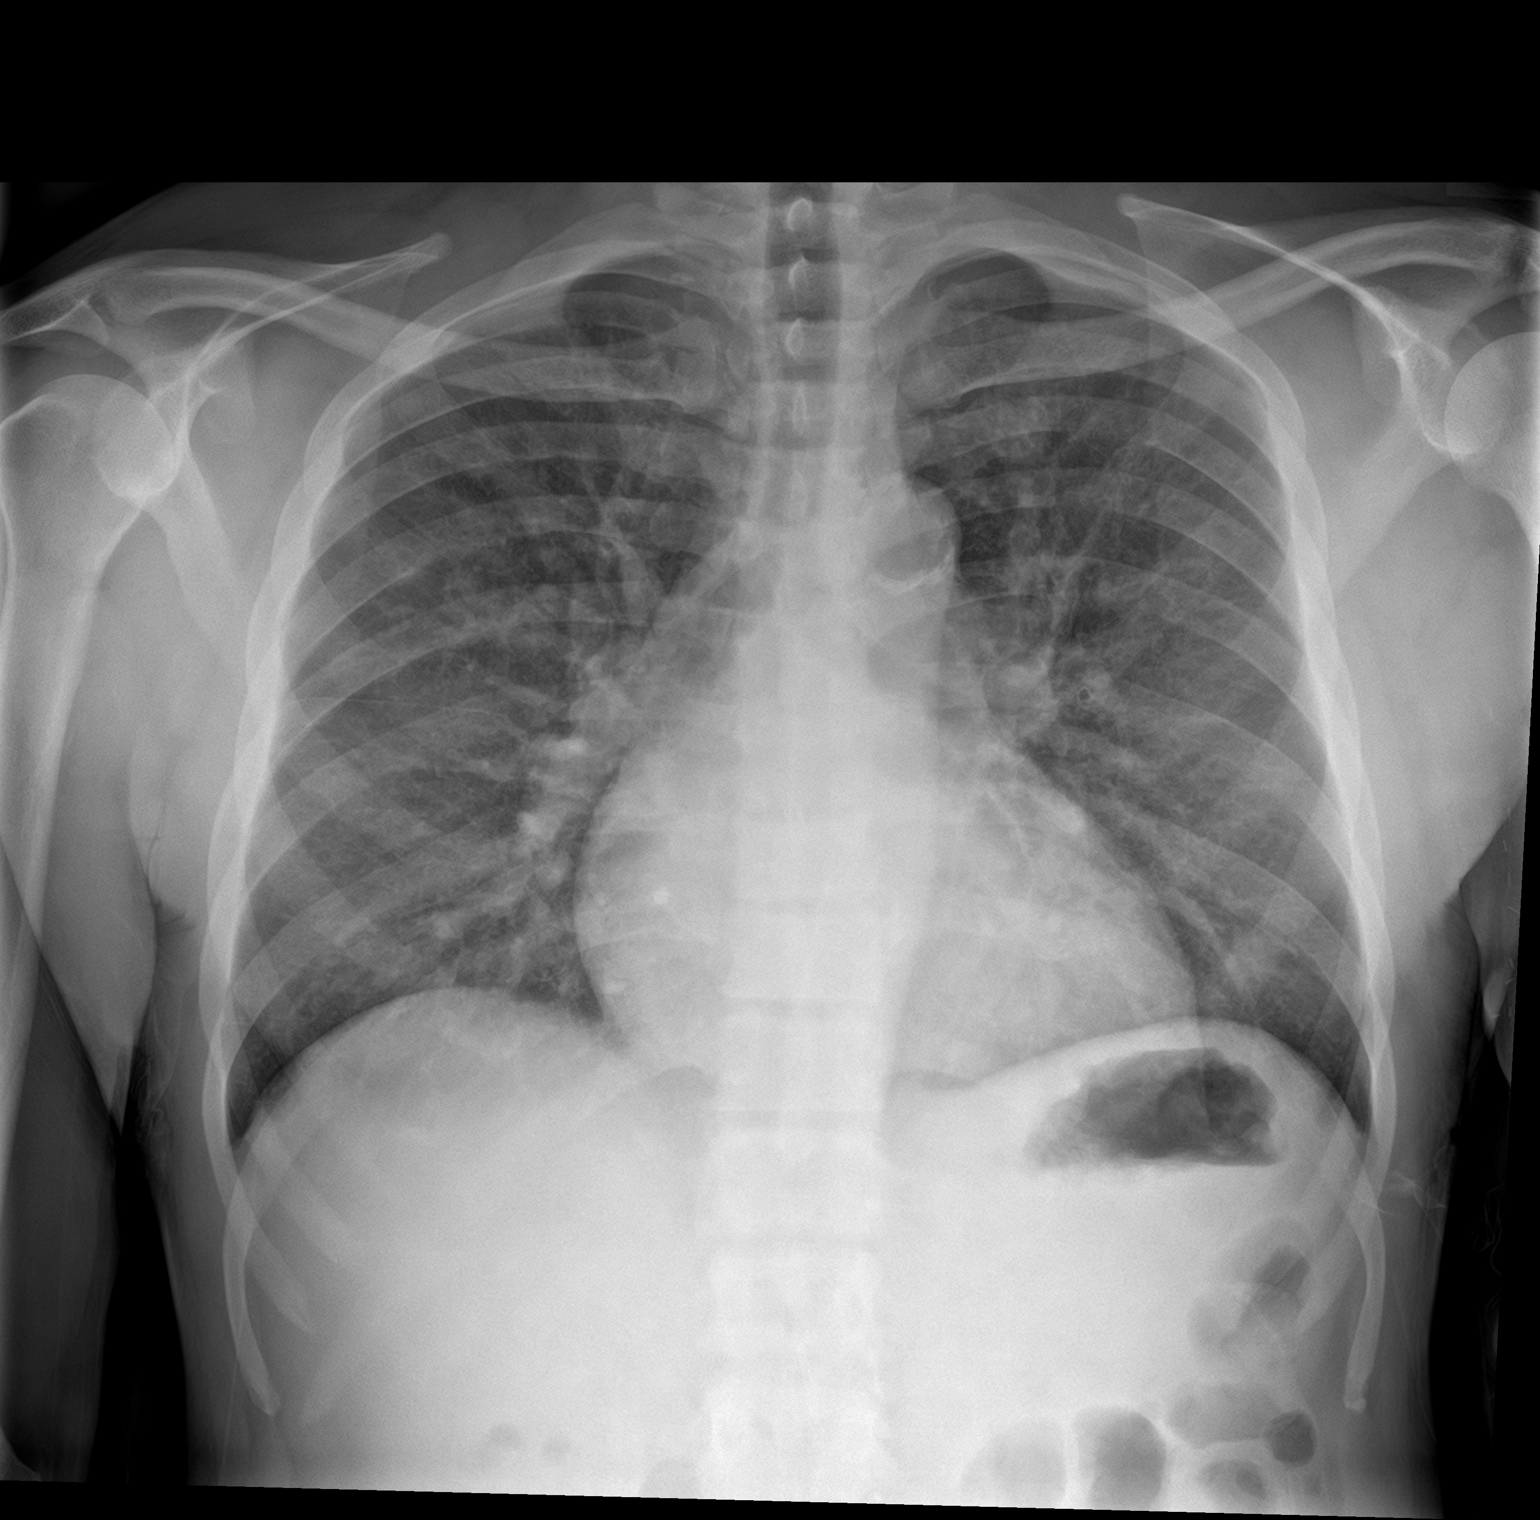

[chest lat]
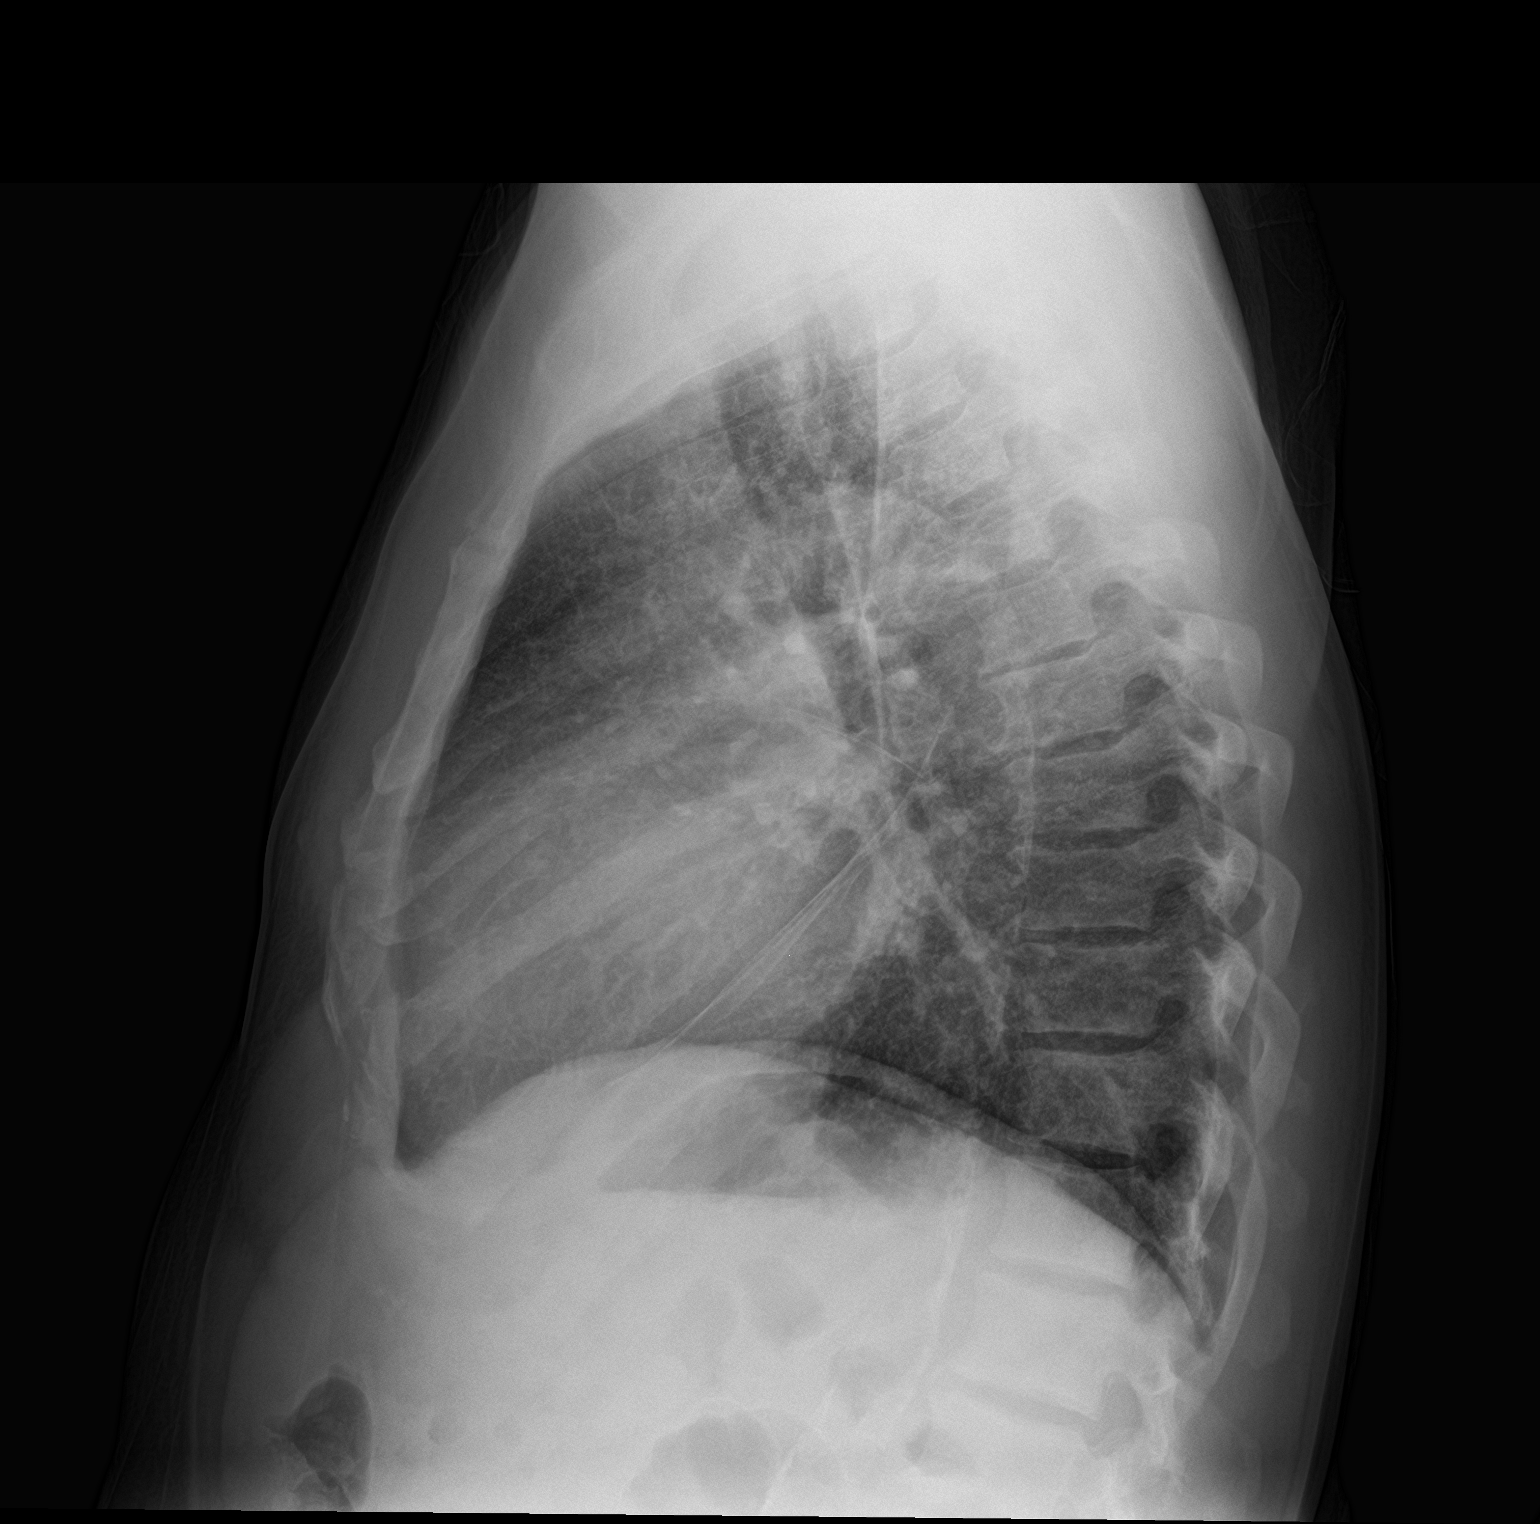

[2 of 2 positions shown; findings below may reference images not displayed]

FINDINGS: There is vascular congestion and mild edema. Superimposed pneumonia
is not excluded. Clinical correlation is recommended. No lobar
consolidation, pleural effusion, pneumothorax. There is
atherosclerotic calcification of the aorta. Stable cardiac
silhouette. No acute osseous pathology.
IMPRESSION: Mild edema. Pneumonia is not excluded. Clinical correlation is
recommended.

## 2019-11-11 MED ORDER — HYDROXYZINE HCL 25 MG PO TABS
25.0000 mg | ORAL_TABLET | Freq: Once | ORAL | Status: AC
  Administered 2019-11-12: 25 mg via ORAL
  Filled 2019-11-11: qty 1

## 2019-11-11 MED ORDER — HYDROCOD POLST-CPM POLST ER 10-8 MG/5ML PO SUER
5.0000 mL | Freq: Once | ORAL | Status: AC
  Administered 2019-11-12: 5 mL via ORAL
  Filled 2019-11-11: qty 5

## 2019-11-11 NOTE — ED Triage Notes (Signed)
Report from [facility]: pt is from home c/o SOB and cough with some blood, pt had dialysis today and was able to finish all but 40 mins; "vitals were all normal"

## 2019-11-11 NOTE — ED Triage Notes (Signed)
Pt arrives via [facility] with SOB x 2-3 days with associated cough. Pt is able to talk in complete sentences at this time and manage all secretions. Pt has clear lung sounds throughout all quadrants.

## 2019-11-11 NOTE — ED Notes (Signed)
Pt placed on 2L nasal canula by this RN. Pt's O2 now 99% on 2L.

## 2019-11-11 NOTE — ED Provider Notes (Signed)
[facility] Emergency Department Provider Note   ____________________________________________   First MD Initiated Contact with Patient 11/11/19 2341     (approximate)  I have reviewed the triage vital signs and the nursing notes.   HISTORY  Chief Complaint Shortness of Breath    HPI DEQUANTA KUEHLER is a 54 y.o. male brought to the ED via EMS from home with a chief complaint of shortness of breath.  Patient has a history of ESRD on HD M/W/F.  Reports he did receive dialysis today with the exception of 40 minutes due to itching.  Instead of removing 3 to 4 kg, he states only 1 kg was removed and he is 3 pounds above his dry weight.  Reports this happens often and that he has them stop dialysis early due to itching.  Also complaining of hemoptysis x2 days.  Recent hospitalization 2/8-2/9 for pulmonary edema secondary to ESRD noncompliant with hemodialysis.  He left AMA.  Review of records show that he was complaining of hemoptysis at that time.  Denies fever, chest pain, abdominal pain, nausea or vomiting.  Makes very little urine daily.       Past Medical History:  Diagnosis Date  . Anemia   . CKD (chronic kidney disease)    Stage 5  Dialysis - M/W/F in [place], [place]  . Dyspnea    tx with inhaler when sick  . ED (erectile dysfunction)   . ETOH abuse   . History of blood transfusion   . Hypertension   . Wears dentures     Patient Active Problem List   Diagnosis Date Noted  . Acute pulmonary edema ([place]) 10/31/2019  . Acute on chronic congestive heart failure ([place]) 10/22/2019  . Acute respiratory distress 10/06/2019  . Volume overload 10/05/2019  . Hypokalemia 10/05/2019  . ESRD (end stage renal disease) ([place]) 10/01/2019  . Leg mass, right   . Hyperbilirubinemia   . Prolonged QT interval 09/19/2019  . Symptomatic anemia 09/19/2019  . Pruritus 09/19/2019  . Hemoptysis 02/11/2019  . Hypercalcemia 01/19/2019  . Anemia in ESRD (end-stage renal  disease) ([place]) 05/17/2018  . Hepatitis B 03/19/2018  . ESRD on dialysis ([place]) 05/26/2017  . Essential hypertension 05/26/2017  . Urinary tract infection, site not specified 10/17/2016  . Coagulation defect, unspecified ([place]) 08/13/2016  . Diarrhea, unspecified 08/13/2016  . Encounter for immunization 08/13/2016  . Headache, unspecified 08/13/2016  . Hypertensive chronic kidney disease with stage 5 chronic kidney disease or end stage renal disease ([place]) 08/13/2016  . Iron deficiency anemia, unspecified 08/13/2016  . Pain, unspecified 08/13/2016  . Moderate protein-calorie malnutrition ([place]) 08/13/2016  . Secondary hyperparathyroidism of renal origin ([place]) 08/13/2016    Past Surgical History:  Procedure Laterality Date  . BASCILIC VEIN TRANSPOSITION Left 08/07/2016   Procedure: LEFT BASILIC VEIN TRANSPOSITION;  Surgeon: Angelia Mould, MD;  Location: [place];  Service: Vascular;  Laterality: Left;  . CLOSED REDUCTION NASAL FRACTURE N/A 08/11/2019   Procedure: CLOSED REDUCTION NASAL FRACTURE WITH STABILIZATION;  Surgeon: Irene Limbo, MD;  Location: [place];  Service: Plastics;  Laterality: N/A;  . COLONOSCOPY N/A 08/12/2016   Procedure: COLONOSCOPY;  Surgeon: Otis Brace, MD;  Location: [facility] [place];  Service: Gastroenterology;  Laterality: N/A;  . ESOPHAGOGASTRODUODENOSCOPY N/A 08/12/2016   Procedure: ESOPHAGOGASTRODUODENOSCOPY (EGD);  Surgeon: Otis Brace, MD;  Location: [place];  Service: Gastroenterology;  Laterality: N/A;  . INSERTION OF DIALYSIS CATHETER N/A 08/07/2016   Procedure: INSERTION OF TUNNELED DIALYSIS CATHETER;  Surgeon:  Angelia Mould, MD;  Location: [place];  Service: Vascular;  Laterality: N/A;  . LIGATION OF ARTERIOVENOUS  FISTULA Left 09/12/2016   Procedure: BANDING OF LEFT  ARTERIOVENOUS  FISTULA;  Surgeon: Angelia Mould, MD;  Location: [facility];  Service: Vascular;  Laterality: Left;  . LOWER EXTREMITY INTERVENTION Right 12/02/2018    Procedure: LOWER EXTREMITY INTERVENTION;  Surgeon: Algernon Huxley, MD;  Location: [facility];  Service: Cardiovascular;  Laterality: Right;    Prior to Admission medications   Medication Sig Start Date End Date Taking? Authorizing Provider  albuterol (PROVENTIL HFA;VENTOLIN HFA) 108 (90 Base) MCG/ACT inhaler Inhale 2 puffs into the lungs every 4 (four) hours as needed for wheezing or shortness of breath. 10/03/18   Orpah Greek, MD  calcitRIOL (ROCALTROL) 0.5 MCG capsule Take 1 capsule (0.5 mcg total) by mouth every Monday, Wednesday, and Friday with hemodialysis. 10/24/19   Pokhrel, Corrie Mckusick, MD  calcium acetate (PHOSLO) 667 MG capsule Take 2 capsules (1,334 mg total) by mouth 3 (three) times daily with meals. 02/12/19   Fritzi Mandes, MD  hydrOXYzine (ATARAX/VISTARIL) 50 MG tablet Take 50 mg by mouth 3 (three) times daily. 08/09/19   [provider]  nicotine (NICODERM CQ - DOSED IN MG/24 HOURS) 14 mg/24hr patch Place 1 patch (14 mg total) onto the skin daily. 10/08/19   Black, Lezlie Octave, NP  sildenafil (VIAGRA) 50 MG tablet Take 50 mg by mouth daily as needed for erectile dysfunction. 07/20/19   [provider]  triamcinolone (KENALOG) 0.025 % ointment Apply 1 application topically 2 (two) times daily. 10/23/19   Pokhrel, Corrie Mckusick, MD    Allergies Patient has no known allergies.  Family History  Problem Relation Age of Onset  . Diabetes Mother   . Kidney failure Mother   . Healthy Father   . Kidney failure Brother   . Healthy Sister   . Kidney disease Daughter   . Post-traumatic stress disorder Neg Hx   . Bladder Cancer Neg Hx   . Kidney cancer Neg Hx     Social History Social History   Tobacco Use  . Smoking status: Current Every Day Smoker    Packs/day: 0.50    Years: 40.00    Pack years: 20.00    Types: Cigarettes  . Smokeless tobacco: Never Used  Substance Use Topics  . Alcohol use: Yes    Alcohol/week: 21.0 standard drinks    Types: 21 Cans  of beer per week    Comment: none since 08/04/16  . Drug use: No    Review of Systems  Constitutional: No fever/chills Eyes: No visual changes. ENT: No sore throat. Cardiovascular: Denies chest pain. Respiratory: Positive for hemoptysis and shortness of breath. Gastrointestinal: No abdominal pain.  No nausea, no vomiting.  No diarrhea.  No constipation. Genitourinary: Negative for dysuria. Musculoskeletal: Negative for back pain. Skin: Negative for rash. Neurological: Negative for headaches, focal weakness or numbness.   ____________________________________________   PHYSICAL EXAM:  VITAL SIGNS: ED Triage Vitals  Enc Vitals Group     BP 11/11/19 2113 (!) 149/81     Pulse Rate 11/11/19 2113 (!) 104     Resp 11/11/19 2113 18     Temp 11/11/19 2113 98.7 F (37.1 C)     Temp Source 11/11/19 2113 Oral     SpO2 11/11/19 2113 91 %     Weight 11/11/19 2114 160 lb (72.6 kg)     Height 11/11/19 2114 5\' 4"  (1.626 m)  Head Circumference --      Peak Flow --      Pain Score 11/11/19 2113 0     Pain Loc --      Pain Edu? --      Excl. in [place]? --     Constitutional: Alert and oriented.  Chronically ill appearing and in mild acute distress. Eyes: Conjunctivae are normal. PERRL. EOMI. Head: Atraumatic. Nose: No congestion/rhinnorhea. Mouth/Throat: Mucous membranes are moist.  Oropharynx non-erythematous. Neck: No stridor.   Cardiovascular: Normal rate, regular rhythm. Grossly normal heart sounds.  Good peripheral circulation. Respiratory: Increased respiratory effort.  No retractions. Lungs with bibasilar rales. Gastrointestinal: Soft and nontender. No distention. No abdominal bruits. No CVA tenderness. Musculoskeletal: No lower extremity tenderness nor edema.  No joint effusions. Neurologic:  Normal speech and language. No gross focal neurologic deficits are appreciated.  Skin:  Skin is warm, dry and intact. No rash noted. Psychiatric: Mood and affect are normal. Speech and  behavior are normal.  ____________________________________________   LABS (all labs ordered are listed, but only abnormal results are displayed)  Labs Reviewed  CBC WITH DIFFERENTIAL/PLATELET - Abnormal; Notable for the following components:      Result Value   RBC 2.14 (*)    Hemoglobin 7.2 (*)    HCT 23.6 (*)    MCV 110.3 (*)    RDW 15.9 (*)    Platelets 142 (*)    Abs Immature Granulocytes 0.10 (*)    All other components within normal limits  BASIC METABOLIC PANEL - Abnormal; Notable for the following components:   Glucose, Bld 117 (*)    BUN 25 (*)    Creatinine, Ser 6.87 (*)    Calcium 8.6 (*)    GFR calc non Af Amer 8 (*)    GFR calc Af Amer 10 (*)    All other components within normal limits  RESPIRATORY PANEL BY RT PCR (FLU A&B, COVID)  CULTURE, BLOOD (ROUTINE X 2)  CULTURE, BLOOD (ROUTINE X 2)  LACTIC ACID, PLASMA  LACTIC ACID, PLASMA   ____________________________________________  EKG  ED ECG REPORT I, Shy Guallpa J, the attending physician, personally viewed and interpreted this ECG.   Date: 11/12/2019  EKG Time: 2125  Rate: 106  Rhythm: sinus tachycardia  Axis: Normal  Intervals:none  ST&T Change: Nonspecific  ____________________________________________  RADIOLOGY  ED MD interpretation: Pulmonary edema; CT demonstrates no PE  Official radiology report(s): DG Chest 2 View  Result Date: 11/11/2019 CLINICAL DATA:  54 year old male with shortness of breath. EXAM: CHEST - 2 VIEW COMPARISON:  Chest radiograph dated 11/01/2019. FINDINGS: There is vascular congestion and mild edema. Superimposed pneumonia is not excluded. Clinical correlation is recommended. No lobar consolidation, pleural effusion, pneumothorax. There is atherosclerotic calcification of the aorta. Stable cardiac silhouette. No acute osseous pathology. IMPRESSION: Mild edema. Pneumonia is not excluded. Clinical correlation is recommended. Electronically Signed   By: Anner Crete M.D.    On: 11/11/2019 21:41   CT Angio Chest PE W/Cm &/Or Wo Cm  Result Date: 11/12/2019 CLINICAL DATA:  Shortness of breath. End-stage renal disease on hemodialysis. Hemoptysis. EXAM: CT ANGIOGRAPHY CHEST WITH CONTRAST TECHNIQUE: Multidetector CT imaging of the chest was performed using the standard protocol during bolus administration of intravenous contrast. Multiplanar CT image reconstructions and MIPs were obtained to evaluate the vascular anatomy. CONTRAST:  59mL OMNIPAQUE IOHEXOL 350 MG/ML SOLN COMPARISON:  07/22/2019 FINDINGS: Cardiovascular: Contrast injection is sufficient to demonstrate satisfactory opacification of the pulmonary arteries to the segmental level. There  is no pulmonary embolus. The heart size is enlarged. Aortic calcifications are noted without evidence for aneurysm. Coronary artery calcifications are noted. There is no significant pericardial effusion. Mediastinum/Nodes: --the gland there is a subcarinal lymph node measuring approximately 1.7 cm. This has increased 3D since the prior study. There are few additional prominent mediastinal and hilar lymph nodes. --No axillary lymphadenopathy. --No supraclavicular lymphadenopathy. --Normal thyroid gland. --The esophagus is unremarkable Lungs/Pleura: Emphysematous changes are noted bilaterally. There is developing interlobular septal thickening. There is no pneumothorax or large pleural effusion. Upper Abdomen: No acute abnormality. 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 Review of the MIP images confirms the above findings. IMPRESSION: 1. No evidence for acute pulmonary embolus. 2. Cardiomegaly with developing interlobular septal thickening, which is favored to represent developing pulmonary edema. 3. Emphysematous changes. 4. Nonspecific mildly enlarged mediastinal and hilar lymph nodes, increased in size compared to the prior study. These are favored to be reactive. 5. Aortic Atherosclerosis (ICD10-I70.0) and  Emphysema (ICD10-J43.9). Electronically Signed   By: Constance Holster M.D.   On: 11/12/2019 01:04    ____________________________________________   PROCEDURES  Procedure(s) performed (including Critical Care):  Procedures   ____________________________________________   INITIAL IMPRESSION / ASSESSMENT AND PLAN / ED COURSE  As part of my medical decision making, I reviewed the following data within the [place] notes reviewed and incorporated, Labs reviewed, EKG interpreted, Old chart reviewed, Radiograph reviewed and Notes from prior ED visits     MYLEZ NORMAN was evaluated in Emergency Department on 11/12/2019 for the symptoms described in the history of present illness. He was evaluated in the context of the global COVID-19 pandemic, which necessitated consideration that the patient might be at risk for infection with the SARS-CoV-2 virus that causes COVID-19. Institutional protocols and algorithms that pertain to the evaluation of patients at risk for COVID-19 are in a state of rapid change based on information released by regulatory bodies including the CDC and federal and state organizations. These policies and algorithms were followed during the patient's care in the ED.    54 year old male with ESRD on HD who presents with shortness of breath and hemoptysis. Differential includes, but is not limited to, viral syndrome, bronchitis including COPD exacerbation, pneumonia, reactive airway disease including asthma, CHF including exacerbation with or without pulmonary/interstitial edema, pneumothorax, ACS, thoracic trauma, and pulmonary embolism.  Laboratory results demonstrate stable anemia from prior.  No electrolyte abnormalities.  Room air oxygen was 91%; 96% on 2 L nasal cannula oxygen.  Chest x-ray demonstrates pulmonary edema.  Review of records show that patient did not receive imaging to evaluate his hemoptysis on his recent hospitalization.  Will  obtain CTA chest to evaluate for PE.  Check blood cultures and lactic acid as chest x-ray cannot exclude underlying pneumonia.     Clinical Course as of Nov 11 220  Sat Nov 12, 2019  0127 Updated patient of CT imaging results.  Will discuss with hospitalist for admission.   [JS]    Clinical Course User Index [JS] Paulette Blanch, MD  Patient requesting Atarax for itching.  Administer Tussionex for cough.   ____________________________________________   FINAL CLINICAL IMPRESSION(S) / ED DIAGNOSES  Final diagnoses:  SOB (shortness of breath)  Acute pulmonary edema (HCC)  ESRD (end stage renal disease) on dialysis (HCC)  Hemoptysis  Anemia, unspecified type     ED Discharge Orders    None       Note:  This document was  prepared using Systems analyst and may include unintentional dictation errors.   Paulette Blanch, MD 11/12/19 [place]

## 2019-11-12 ENCOUNTER — Encounter: Payer: Self-pay | Admitting: Radiology

## 2019-11-12 ENCOUNTER — Emergency Department: Payer: Medicare Other

## 2019-11-12 DIAGNOSIS — N2581 Secondary hyperparathyroidism of renal origin: Secondary | ICD-10-CM | POA: Diagnosis not present

## 2019-11-12 DIAGNOSIS — J9601 Acute respiratory failure with hypoxia: Secondary | ICD-10-CM

## 2019-11-12 DIAGNOSIS — R0602 Shortness of breath: Secondary | ICD-10-CM | POA: Diagnosis not present

## 2019-11-12 DIAGNOSIS — D631 Anemia in chronic kidney disease: Secondary | ICD-10-CM

## 2019-11-12 DIAGNOSIS — N186 End stage renal disease: Secondary | ICD-10-CM

## 2019-11-12 DIAGNOSIS — E8770 Fluid overload, unspecified: Secondary | ICD-10-CM | POA: Diagnosis not present

## 2019-11-12 DIAGNOSIS — I700 Atherosclerosis of aorta: Secondary | ICD-10-CM | POA: Diagnosis not present

## 2019-11-12 DIAGNOSIS — J9621 Acute and chronic respiratory failure with hypoxia: Secondary | ICD-10-CM

## 2019-11-12 DIAGNOSIS — R042 Hemoptysis: Secondary | ICD-10-CM | POA: Diagnosis not present

## 2019-11-12 DIAGNOSIS — J439 Emphysema, unspecified: Secondary | ICD-10-CM

## 2019-11-12 DIAGNOSIS — I120 Hypertensive chronic kidney disease with stage 5 chronic kidney disease or end stage renal disease: Secondary | ICD-10-CM | POA: Diagnosis not present

## 2019-11-12 DIAGNOSIS — Z992 Dependence on renal dialysis: Secondary | ICD-10-CM

## 2019-11-12 HISTORY — DX: Atherosclerosis of aorta: I70.0

## 2019-11-12 HISTORY — DX: Emphysema, unspecified: J43.9

## 2019-11-12 LAB — RESPIRATORY PANEL BY RT PCR (FLU A&B, COVID)
Influenza A by PCR: NEGATIVE
Influenza B by PCR: NEGATIVE
SARS Coronavirus 2 by RT PCR: NEGATIVE

## 2019-11-12 LAB — CBC
HCT: 21 % — ABNORMAL LOW (ref 39.0–52.0)
Hemoglobin: 6.4 g/dL — ABNORMAL LOW (ref 13.0–17.0)
MCH: 33.7 pg (ref 26.0–34.0)
MCHC: 30.5 g/dL (ref 30.0–36.0)
MCV: 110.5 fL — ABNORMAL HIGH (ref 80.0–100.0)
Platelets: 121 10*3/uL — ABNORMAL LOW (ref 150–400)
RBC: 1.9 MIL/uL — ABNORMAL LOW (ref 4.22–5.81)
RDW: 15.9 % — ABNORMAL HIGH (ref 11.5–15.5)
WBC: 7.8 10*3/uL (ref 4.0–10.5)
nRBC: 0 % (ref 0.0–0.2)

## 2019-11-12 LAB — RENAL FUNCTION PANEL
Albumin: 3.2 g/dL — ABNORMAL LOW (ref 3.5–5.0)
Anion gap: 15 (ref 5–15)
BUN: 36 mg/dL — ABNORMAL HIGH (ref 6–20)
CO2: 28 mmol/L (ref 22–32)
Calcium: 8.3 mg/dL — ABNORMAL LOW (ref 8.9–10.3)
Chloride: 96 mmol/L — ABNORMAL LOW (ref 98–111)
Creatinine, Ser: 8.89 mg/dL — ABNORMAL HIGH (ref 0.61–1.24)
GFR calc Af Amer: 7 mL/min — ABNORMAL LOW (ref >60)
GFR calc non Af Amer: 6 mL/min — ABNORMAL LOW (ref >60)
Glucose, Bld: 109 mg/dL — ABNORMAL HIGH (ref 70–99)
Phosphorus: 5.2 mg/dL — ABNORMAL HIGH (ref 2.5–4.6)
Potassium: 4.6 mmol/L (ref 3.5–5.1)
Sodium: 139 mmol/L (ref 135–145)

## 2019-11-12 LAB — CREATININE, SERUM
Creatinine, Ser: 7.72 mg/dL — ABNORMAL HIGH (ref 0.61–1.24)
GFR calc Af Amer: 8 mL/min — ABNORMAL LOW (ref >60)
GFR calc non Af Amer: 7 mL/min — ABNORMAL LOW (ref >60)

## 2019-11-12 LAB — LACTIC ACID, PLASMA: Lactic Acid, Venous: 1.2 mmol/L (ref 0.5–1.9)

## 2019-11-12 IMAGING — CT CT ANGIO CHEST
2 of 6 series · 18 of 46 positions shown · IV contrast (APPLIED)
Comparison: [DATE]

CLINICAL DATA: Shortness of breath. End-stage renal disease on
hemodialysis. Hemoptysis.

EXAM:
CT ANGIOGRAPHY CHEST WITH CONTRAST
TECHNIQUE: Multidetector CT imaging of the chest was performed using the
standard protocol during bolus administration of intravenous
contrast. Multiplanar CT image reconstructions and MIPs were
obtained to evaluate the vascular anatomy.
CONTRAST:  75mL OMNIPAQUE IOHEXOL 350 MG/ML SOLN

[Series 5: thins · axial · 0.72mm/px · z∈[-678,-430]mm · 16 of 272 slices shown]
[im 12/272  lung]
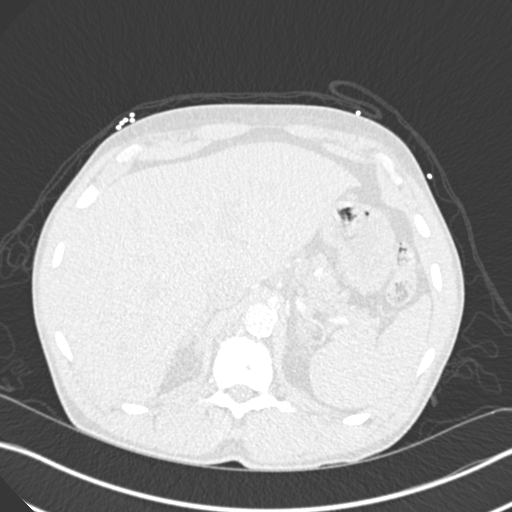
[im 36/272  soft-tissue]
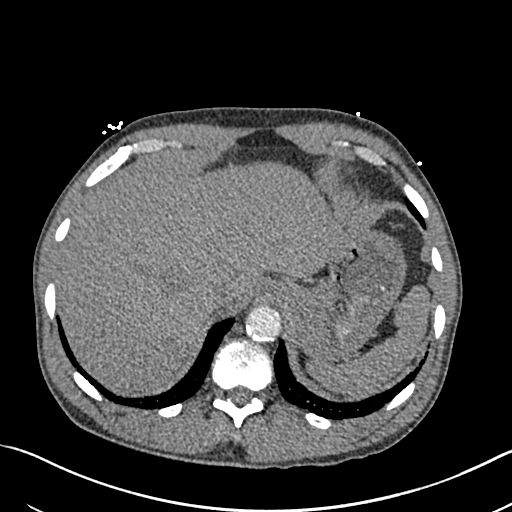
[im 48/272  lung]
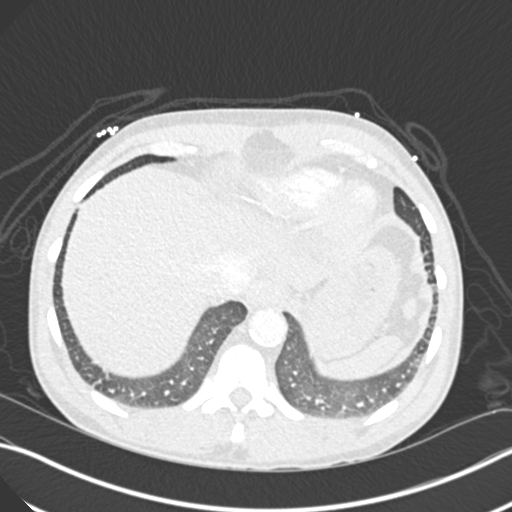
[im 59/272  soft-tissue]
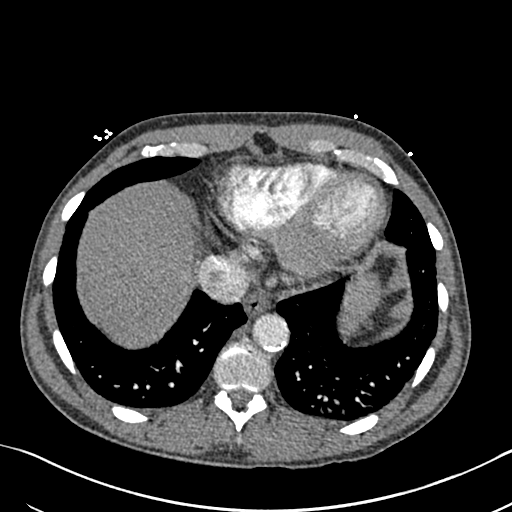
[im 83/272  lung]
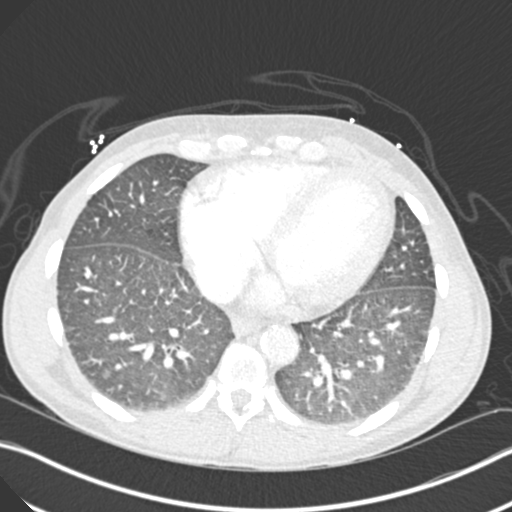
[im 95/272  soft-tissue]
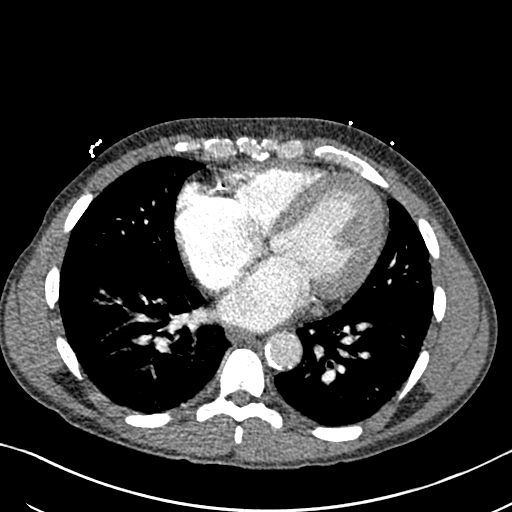
[im 107/272  lung]
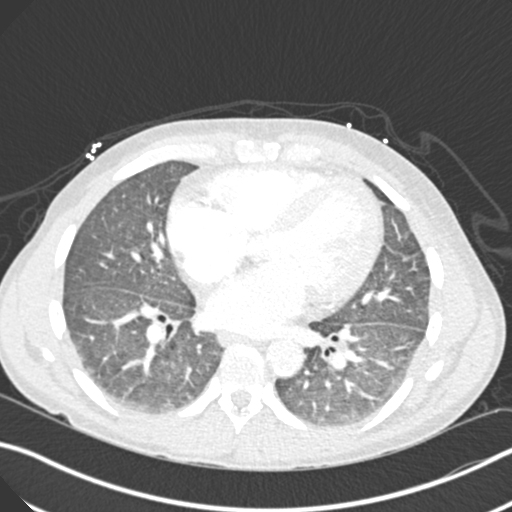
[im 130/272  soft-tissue]
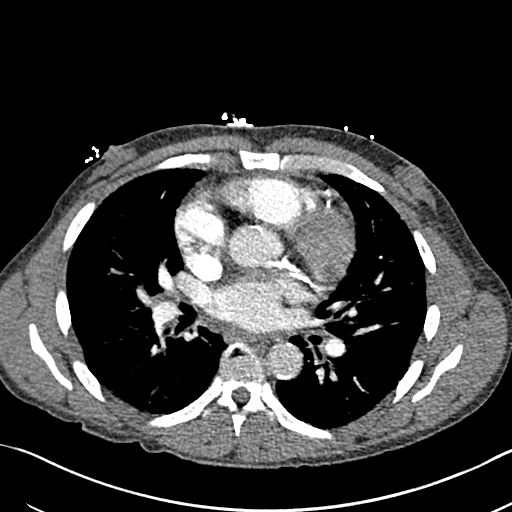
[im 142/272  lung]
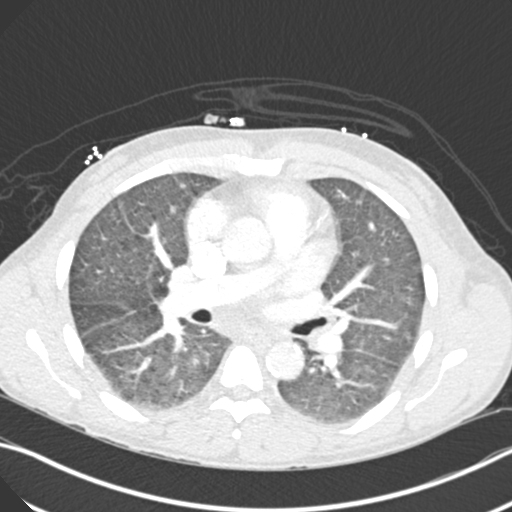
[im 165/272  soft-tissue]
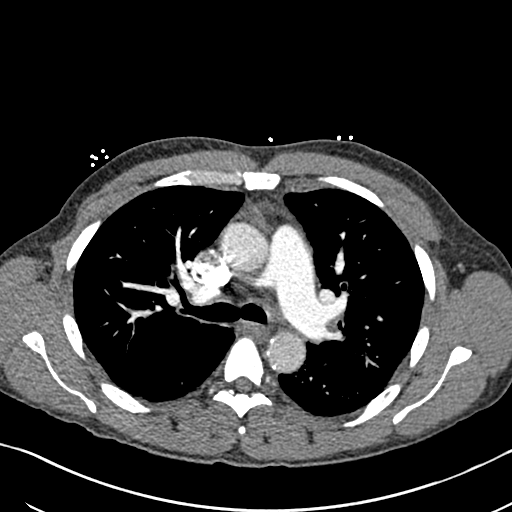
[im 177/272  lung]
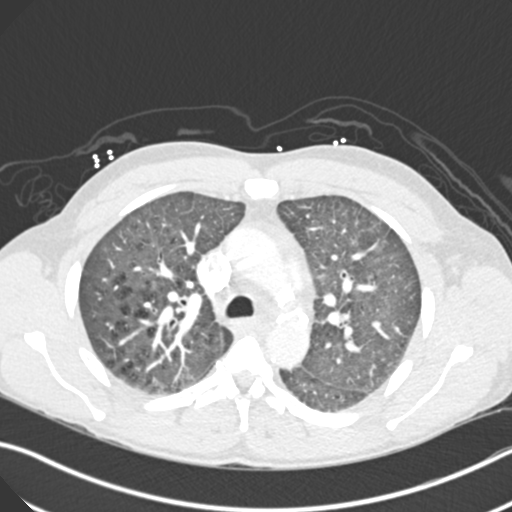
[im 189/272  soft-tissue]
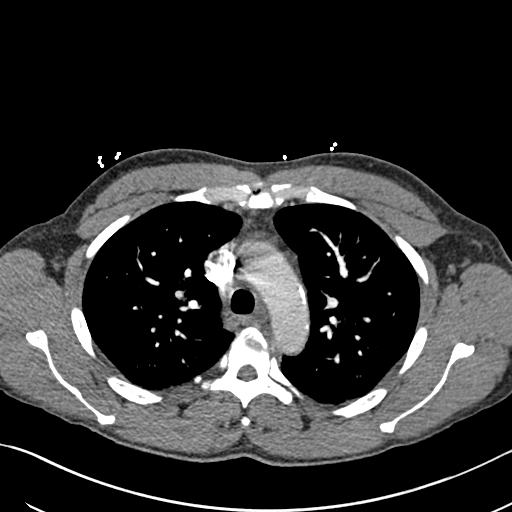
[im 213/272  lung]
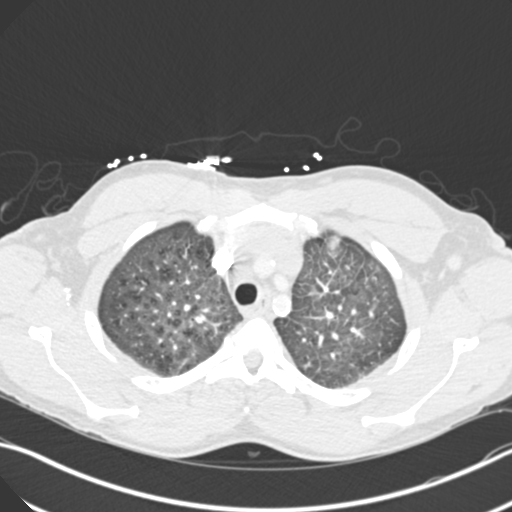
[im 224/272  soft-tissue]
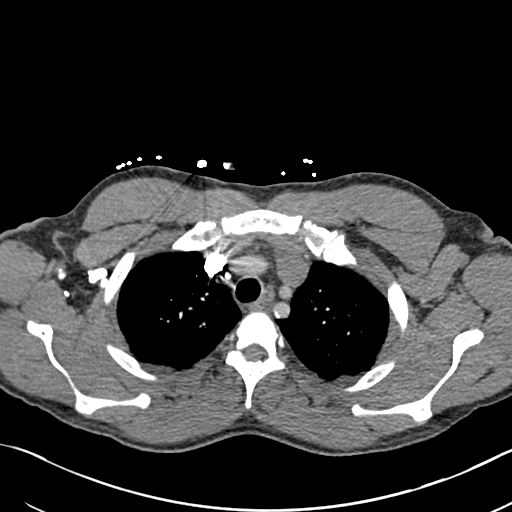
[im 236/272  lung]
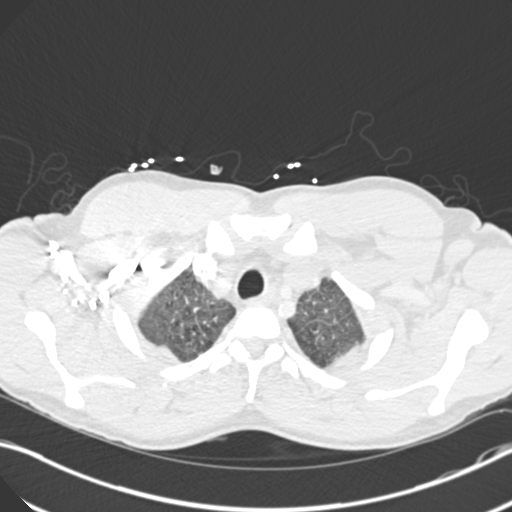
[im 260/272  soft-tissue]
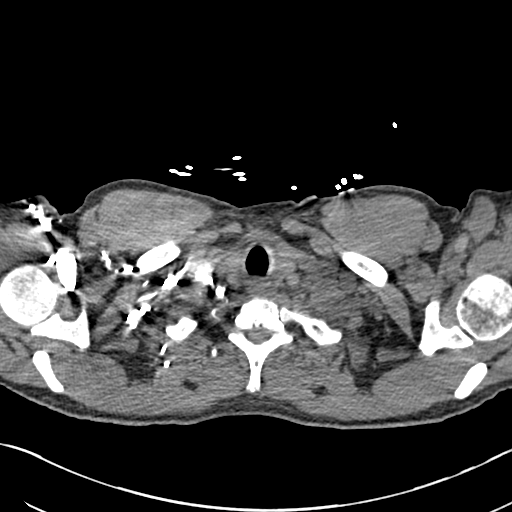

[Series 7: coronal mpr · coronal · 0.53mm/px · 2 of 85 slices shown]
[im 29/85  soft-tissue]
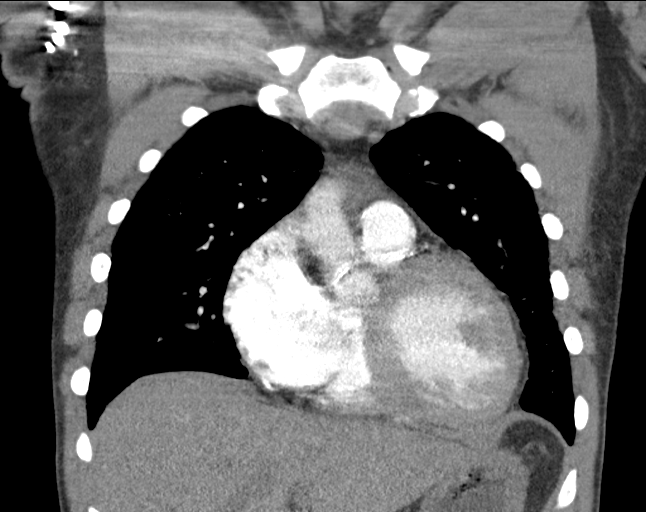
[im 57/85  soft-tissue]
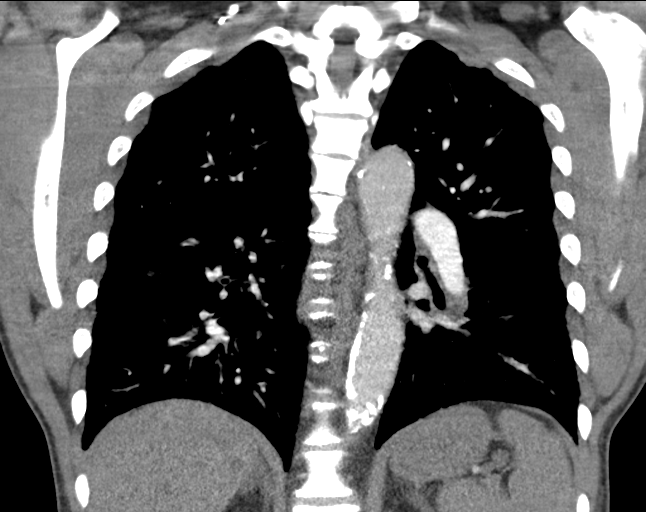

[18 of 46 positions shown; findings below may reference images not displayed]

FINDINGS: Cardiovascular: Contrast injection is sufficient to demonstrate
satisfactory opacification of the pulmonary arteries to the
segmental level. There is no pulmonary embolus. The heart size is
enlarged. Aortic calcifications are noted without evidence for
aneurysm. Coronary artery calcifications are noted. There is no
significant pericardial effusion.

Mediastinum/Nodes:

--the gland there is a subcarinal lymph node measuring approximately
1.7 cm. This has increased 3D since the prior study. There are few
additional prominent mediastinal and hilar lymph nodes.

--No axillary lymphadenopathy.

--No supraclavicular lymphadenopathy.

--Normal thyroid gland.

--The esophagus is unremarkable

Lungs/Pleura: Emphysematous changes are noted bilaterally. There is
developing interlobular septal thickening. There is no pneumothorax
or large pleural effusion.

Upper Abdomen: No acute abnormality.

Musculoskeletal: Again noted are findings of renal osteodystrophy.

Review of the MIP images confirms the above findings.
IMPRESSION: 1. No evidence for acute pulmonary embolus.
2. Cardiomegaly with developing interlobular septal thickening,
which is favored to represent developing pulmonary edema.
3. Emphysematous changes.
4. Nonspecific mildly enlarged mediastinal and hilar lymph nodes,
increased in size compared to the prior study. These are favored to
be reactive.
5. Aortic Atherosclerosis ([KZ]-[KZ]) and Emphysema ([KZ]-[KZ]).

## 2019-11-12 MED ORDER — SENNOSIDES-DOCUSATE SODIUM 8.6-50 MG PO TABS: 1.0000 | ORAL_TABLET | Freq: Every evening | ORAL | Status: DC | PRN

## 2019-11-12 MED ORDER — HYDROCORTISONE 1 % EX CREA
TOPICAL_CREAM | CUTANEOUS | Status: DC | PRN
  Filled 2019-11-12 (×2): qty 28

## 2019-11-12 MED ORDER — CALCIUM ACETATE (PHOS BINDER) 667 MG PO CAPS
1334.0000 mg | ORAL_CAPSULE | Freq: Three times a day (TID) | ORAL | Status: DC
  Administered 2019-11-12: 667 mg via ORAL
  Filled 2019-11-12: qty 2

## 2019-11-12 MED ORDER — ACETAMINOPHEN 325 MG PO TABS: 650.0000 mg | ORAL_TABLET | Freq: Four times a day (QID) | ORAL | Status: DC | PRN

## 2019-11-12 MED ORDER — DIPHENHYDRAMINE HCL 25 MG PO CAPS
25.0000 mg | ORAL_CAPSULE | Freq: Once | ORAL | Status: DC
  Filled 2019-11-12: qty 1

## 2019-11-12 MED ORDER — IOHEXOL 350 MG/ML SOLN
75.0000 mL | Freq: Once | INTRAVENOUS | Status: AC | PRN
  Administered 2019-11-12: 75 mL via INTRAVENOUS

## 2019-11-12 MED ORDER — SODIUM CHLORIDE 0.9% IV SOLUTION: Freq: Once | INTRAVENOUS | Status: AC

## 2019-11-12 MED ORDER — ACETAMINOPHEN 650 MG RE SUPP: 650.0000 mg | Freq: Four times a day (QID) | RECTAL | Status: DC | PRN

## 2019-11-12 MED ORDER — ACETAMINOPHEN 325 MG PO TABS
650.0000 mg | ORAL_TABLET | Freq: Once | ORAL | Status: DC
  Filled 2019-11-12: qty 2

## 2019-11-12 MED ORDER — HEPARIN SODIUM (PORCINE) 5000 UNIT/ML IJ SOLN
5000.0000 [IU] | Freq: Three times a day (TID) | INTRAMUSCULAR | Status: DC
  Administered 2019-11-12: 5000 [IU] via SUBCUTANEOUS
  Filled 2019-11-12: qty 1

## 2019-11-12 NOTE — Progress Notes (Signed)
[facility]  ROUNDING NOTE   Subjective:   [name] admitted to [facility] on 11/11/2019 for Acute pulmonary edema ([place]) [J81.0] SOB (shortness of breath) [R06.02] ESRD (end stage renal disease) on dialysis ([place]) [N18.6, Z99.2] Fluid overload [E87.70] Hemoptysis [R04.2] Anemia, unspecified type [D64.9]   Yesterday, patient got one hour of dialysis treatment but then came off due to itching.   He then presents to the ED last night with shortness of breath and pulmonary edema.   Objective:  Vital signs in last 24 hours:  Temp:  [98.7 F (37.1 C)-98.9 F (37.2 C)] 98.9 F (37.2 C) (02/20 0336) Pulse Rate:  [99-113] 113 (02/20 0336) Resp:  [18-24] 18 (02/20 0336) BP: (144-156)/(79-92) 152/92 (02/20 0336) SpO2:  [91 %-100 %] 96 % (02/20 0639) Weight:  [71.8 kg-72.6 kg] 71.8 kg (02/20 0336)  Weight change:  Filed Weights   11/11/19 2114 11/12/19 0336  Weight: 72.6 kg 71.8 kg    Intake/Output: I/O last 3 completed shifts: In: 200 [P.O.:200] Out: 1 [Stool:1]   Intake/Output this shift:  Total I/O In: 240 [P.O.:240] Out: -   Physical Exam: General: NAD, laying in bed  Head: Normocephalic, atraumatic. Moist oral mucosal membranes  Eyes: Anicteric, PERRL  Neck: Supple, trachea midline  Lungs:  Bilateral crackles, 3L [place]  Heart: Regular rate and rhythm  Abdomen:  Soft, nontender,   Extremities: No peripheral edema.  Neurologic: Nonfocal, moving all four extremities  Skin: No lesions  Access: Left AVF    Basic Metabolic Panel: Recent Labs  Lab 11/11/19 2118 11/12/19 0648  NA 143  --   K 3.8  --   CL 98  --   CO2 32  --   GLUCOSE 117*  --   BUN 25*  --   CREATININE 6.87* 7.72*  CALCIUM 8.6*  --     Liver Function Tests: No results for input(s): AST, ALT, ALKPHOS, BILITOT, PROT, ALBUMIN in the last 168 hours. No results for input(s): LIPASE, AMYLASE in the last 168 hours. No results for input(s): AMMONIA in the last 168  hours.  CBC: Recent Labs  Lab 11/11/19 2118 11/12/19 0648  WBC 8.4 7.8  NEUTROABS 6.5  --   HGB 7.2* 6.4*  HCT 23.6* 21.0*  MCV 110.3* 110.5*  PLT 142* 121*    Cardiac Enzymes: No results for input(s): CKTOTAL, CKMB, CKMBINDEX, TROPONINI in the last 168 hours.  BNP: Invalid input(s): POCBNP  CBG: No results for input(s): GLUCAP in the last 168 hours.  Microbiology: Results for orders placed or performed during the hospital encounter of 11/11/19  Respiratory Panel by RT PCR (Flu A&B, Covid) - Nasopharyngeal Swab     Status: None   Collection Time: 11/12/19 12:37 AM   Specimen: Nasopharyngeal Swab  Result Value Ref Range Status   SARS Coronavirus 2 by RT PCR NEGATIVE NEGATIVE Final    Comment: (NOTE) SARS-CoV-2 target nucleic acids are NOT DETECTED. The SARS-CoV-2 RNA is generally detectable in upper respiratoy specimens during the acute phase of infection. The lowest concentration of SARS-CoV-2 viral copies this assay can detect is 131 copies/mL. A negative result does not preclude SARS-Cov-2 infection and should not be used as the sole basis for treatment or other patient management decisions. A negative result may occur with  improper specimen collection/handling, submission of specimen other than nasopharyngeal swab, presence of viral mutation(s) within the areas targeted by this assay, and inadequate number of viral copies (<131 copies/mL). A negative result must be combined  with clinical observations, patient history, and epidemiological information. The expected result is Negative. Fact Sheet for Patients:  PinkCheek.be Fact Sheet for Healthcare Providers:  GravelBags.it This test is not yet ap proved or cleared by the [place] FDA and  has been authorized for detection and/or diagnosis of SARS-CoV-2 by FDA under an Emergency Use Authorization (EUA). This EUA will remain  in effect (meaning this  test can be used) for the duration of the COVID-19 declaration under Section 564(b)(1) of the Act, 21 U.S.C. section 360bbb-3(b)(1), unless the authorization is terminated or revoked sooner.    Influenza A by PCR NEGATIVE NEGATIVE Final   Influenza B by PCR NEGATIVE NEGATIVE Final    Comment: (NOTE) The Xpert Xpress SARS-CoV-2/FLU/RSV assay is intended as an aid in  the diagnosis of influenza from Nasopharyngeal swab specimens and  should not be used as a sole basis for treatment. Nasal washings and  aspirates are unacceptable for Xpert Xpress SARS-CoV-2/FLU/RSV  testing. Fact Sheet for Patients: PinkCheek.be Fact Sheet for Healthcare Providers: GravelBags.it This test is not yet approved or cleared by the [place] FDA and  has been authorized for detection and/or diagnosis of SARS-CoV-2 by  FDA under an Emergency Use Authorization (EUA). This EUA will remain  in effect (meaning this test can be used) for the duration of the  Covid-19 declaration under Section 564(b)(1) of the Act, 21  U.S.C. section 360bbb-3(b)(1), unless the authorization is  terminated or revoked. Performed at [facility], [address] [place], [place] [address]   Culture, blood (routine x 2)     Status: None (Preliminary result)   Collection Time: 11/12/19 12:37 AM   Specimen: BLOOD RIGHT FOREARM  Result Value Ref Range Status   Specimen Description BLOOD RIGHT FOREARM  Final   Special Requests Blood Culture adequate volume  Final   Culture   Final    NO GROWTH < 12 HOURS Performed at [facility], [address] [place], [place] [address]    Report Status PENDING  Incomplete  Culture, blood (routine x 2)     Status: None (Preliminary result)   Collection Time: 11/12/19 12:37 AM   Specimen: BLOOD RIGHT HAND  Result Value Ref Range Status   Specimen Description BLOOD RIGHT HAND  Final   Special Requests Blood Culture  adequate volume  Final   Culture   Final    NO GROWTH < 12 HOURS Performed at [facility], [address] [place], [place] [address]    Report Status PENDING  Incomplete    Coagulation Studies: No results for input(s): LABPROT, INR in the last 72 hours.  Urinalysis: No results for input(s): COLORURINE, LABSPEC, PHURINE, GLUCOSEU, HGBUR, BILIRUBINUR, KETONESUR, PROTEINUR, UROBILINOGEN, NITRITE, LEUKOCYTESUR in the last 72 hours.  Invalid input(s): APPERANCEUR    Imaging: DG Chest 2 View  Result Date: 11/11/2019 CLINICAL DATA:  54 year old male with shortness of breath. EXAM: CHEST - 2 VIEW COMPARISON:  Chest radiograph dated 11/01/2019. FINDINGS: There is vascular congestion and mild edema. Superimposed pneumonia is not excluded. Clinical correlation is recommended. No lobar consolidation, pleural effusion, pneumothorax. There is atherosclerotic calcification of the aorta. Stable cardiac silhouette. No acute osseous pathology. IMPRESSION: Mild edema. Pneumonia is not excluded. Clinical correlation is recommended. Electronically Signed   By: Anner Crete M.D.   On: 11/11/2019 21:41   CT Angio Chest PE W/Cm &/Or Wo Cm  Result Date: 11/12/2019 CLINICAL DATA:  Shortness of breath. End-stage renal disease on hemodialysis. Hemoptysis. EXAM:  CT ANGIOGRAPHY CHEST WITH CONTRAST TECHNIQUE: Multidetector CT imaging of the chest was performed using the standard protocol during bolus administration of intravenous contrast. Multiplanar CT image reconstructions and MIPs were obtained to evaluate the vascular anatomy. CONTRAST:  58mL OMNIPAQUE IOHEXOL 350 MG/ML SOLN COMPARISON:  07/22/2019 FINDINGS: Cardiovascular: Contrast injection is sufficient to demonstrate satisfactory opacification of the pulmonary arteries to the segmental level. There is no pulmonary embolus. The heart size is enlarged. Aortic calcifications are noted without evidence for aneurysm. Coronary artery calcifications are  noted. There is no significant pericardial effusion. Mediastinum/Nodes: --the gland there is a subcarinal lymph node measuring approximately 1.7 cm. This has increased 3D since the prior study. There are few additional prominent mediastinal and hilar lymph nodes. --No axillary lymphadenopathy. --No supraclavicular lymphadenopathy. --Normal thyroid gland. --The esophagus is unremarkable Lungs/Pleura: Emphysematous changes are noted bilaterally. There is developing interlobular septal thickening. There is no pneumothorax or large pleural effusion. Upper Abdomen: No acute abnormality. 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 Review of the MIP images confirms the above findings. IMPRESSION: 1. No evidence for acute pulmonary embolus. 2. Cardiomegaly with developing interlobular septal thickening, which is favored to represent developing pulmonary edema. 3. Emphysematous changes. 4. Nonspecific mildly enlarged mediastinal and hilar lymph nodes, increased in size compared to the prior study. These are favored to be reactive. 5. Aortic Atherosclerosis (ICD10-I70.0) and Emphysema (ICD10-J43.9). Electronically Signed   By: Constance Holster M.D.   On: 11/12/2019 01:04     Medications:    . sodium chloride   Intravenous Once  . acetaminophen  650 mg Oral Once  . diphenhydrAMINE  25 mg Oral Once  . heparin  5,000 Units Subcutaneous Q8H   acetaminophen **OR** acetaminophen, senna-docusate  Assessment/ Plan:  Jordan [name] is a 54 y.o. black male with end stage renal disease on hemodialysis, hypertension, EtOH abuse, hepatitis B who is admitted to [facility] on 11/11/2019 for Acute pulmonary edema ([place]) [J81.0] SOB (shortness of breath) [R06.02] ESRD (end stage renal disease) on dialysis ([place]) [N18.6, Z99.2] Fluid overload [E87.70] Hemoptysis [R04.2] Anemia, unspecified type [D64.9]  [facility] ([place]) MWF [place]. Left AVF  1. End Stage Renal Disease: only  got 1 hour of dialysis treatment yesterday. Now with shortness of breath and pulmonary edema.  Schedule extra dialysis treatment for later today. Orders prepared.   2. Anemia of chronic kidney disease: hemoglobin 6.4.  PRBC transfusion for today EPO with HD treatment  3. Secondary Hyperparathyroidism: with hyperphosphatemia:  Pruritis could be due to hyperphosphatemia but he has this symptom even when phosphorus is at goal.  - restart calcium acetate with meals.  - hold calcitriol  4. Hypertension: 152/92. Elevated. Not currently on any home blood pressure agents.    LOS: 0 Tracey Hermance 2/20/202110:28 AM

## 2019-11-12 NOTE — Progress Notes (Addendum)
PROGRESS NOTE  Jordan [name] [id number] DOB: 1966-01-02 DOA: 11/11/2019 PCP: Jearld Fenton, NP  Brief History   54 year old man PMH ESRD presented with shortness of breath, hemoptysis, was admitted for acute hypoxic respiratory failure secondary to volume overload secondary to poor compliance with hemodialysis.  A & P  Acute hypoxic respiratory failure secondary to volume overload secondary to poor compliance with hemodialysis, partial dialysis session as an outpatient. --Nephrology consultation appreciated. --Wean oxygen as tolerated.  Acute on chronic anemia of CKD.  No evidence of ongoing bleeding.  Although low volume hemoptysis was reported, the patient's history is suggestive of benign and minimal hemoptysis.  I do not think this has anything to do with his anemia.  Review of his chart is notable for hemoglobin 7-8. --PRBC transfusion with hemodialysis today, Epogen treatment with hemodialysis today  Low volume hemoptysis reported.  Patient's description is really tiny bit of blood when he coughed yesterday.  CTA chest negative for acute process.  Suspect this is benign in nature.  Monitor clinically.  Patient denies epistaxis. --Could consider outpatient follow-up if recurs.  Chronic intermittent thrombocytopenia, appears stable, follow-up as an outpatient  Aortic atherosclerosis and emphysema seen on chest CT --Follow-up as an outpatient  Addendum.  Complaint of neck swelling and difficulty swallowing.  I evaluated patient in hemodialysis suite.  He does have some asymmetric swelling of the face right greater than left and some fullness of the neck.  This appears to be subcutaneous edema.  It is nontender.  The skin appears unremarkable.  There is no erythema.  No lymphadenopathy noted.  Speech is fluent and clear.  Respiratory status appears quite stable.  Tongue appears unremarkable, hard and soft palate appear unremarkable.  Uvula is visualized and appeared unremarkable.   Got one dose of of Tussionex overnight.  However this does not appear to be an allergic reaction.  Suspect this was subcutaneous edema from volume overload.  Recommend hemodialysis.  Monitor clinically.  Chart review  Discharge 1/21, treated for acute hypoxic respiratory failure secondary to volume overload, mild fever Covid negative, flu negative, pruritus over the legs  Discharge 2/9 [place].  Presented with shortness of breath after missing dialysis.  Admitted for acute hypoxic respiratory failure secondary to volume overload, noncompliance.  Disposition Plan:  From: Home Anticipated disposition: Home Discussion: Needs close monitoring of blood count, hemodialysis today, resolution of hypoxia, repeat blood work in the morning.  If condition stabilizes, possible discharge tomorrow.  DVT prophylaxis: SCDs Code Status: Full Family Communication: none present    Murray Hodgkins, MD  [facility] Direct contact: see [email or web address] (further directions at bottom of note if needed) 7PM-7AM contact night coverage as at bottom of note 11/12/2019, 3:14 PM  LOS: 0 days   Significant Hospital Events   . 2/19 admitted for volume overload, acute hypoxic respiratory failure   Consults:  . Nephrology   Procedures:  . Hemodialysis  Significant Diagnostic Tests:  . SARS-CoV-2 negative . Chest x-ray mild edema. . CT chest no PE.  Pulmonary edema.   Micro Data:  .    Antimicrobials:  .   Interval History/Subjective  Feels okay today.  Breathing okay.  No hemoptysis today.  Not clear whether he had any yesterday of note.  He reports just a little bit of blood when he coughed.  Objective   Vitals:  Vitals:   11/12/19 0639 11/12/19 1405  BP:  129/86  Pulse:  97  Resp:  18  Temp:  98.3  F (36.8 C)  SpO2: 96% 94%    Exam:  Constitutional.  Appears calm, comfortable. Respiratory.  Clear to auscultation bilaterally.  No wheezes, rales or rhonchi. Cardiovascular.   Regular rate and rhythm.  No murmur, rub or gallop.  No lower extremity edema.  Psychiatric.  Grossly normal mood and affect.  Speech fluent and appropriate.  I have personally reviewed the following:   Today's Data  . Hemoglobin 6.4, platelets 121, WBC within normal limits.  Scheduled Meds: . sodium chloride   Intravenous Once  . acetaminophen  650 mg Oral Once  . calcium acetate  1,334 mg Oral TID WC  . diphenhydrAMINE  25 mg Oral Once  . heparin  5,000 Units Subcutaneous Q8H   Continuous Infusions:  Principal Problem:   Volume overload Active Problems:   ESRD on dialysis ([place])   Essential hypertension   Anemia in ESRD (end-stage renal disease) (H[facility])   Hemoptysis   Fluid overload   Acute respiratory failure with hypoxia ([place])   LOS: 0 days   How to contact the [facility] Attending or Consulting provider 7A - 7P or covering provider during after hours [place], for this patient?  1. Check the care team in [facility] and look for a) attending/consulting TR[facility] provider listed and b) the [facility] team listed 2. Log into [email or web address] and use [facility]'s universal password to access. If you do not have the password, please contact the hospital operator. 3. Locate the [facility] provider you are looking for under [facility] and page to a number that you can be directly reached. 4. If you still have difficulty reaching the provider, please page the [facility] (Director on Call) for the Hospitalists listed on amion for assistance.

## 2019-11-12 NOTE — H&P (Signed)
History and Physical    Jordan [name] [id number] DOB: [birth year] DOA: 11/11/2019  PCP: Jearld Fenton, NP   Patient coming from: home I have personally briefly reviewed patient's old medical records in [place]  Chief Complaint: coughing up blood, shortness of breath  HPI: Jordan [name] is a 54 y.o. male with medical history significant for ESRD on HD MWF with history of noncompliance with dialysis and recent overnight hospitalization on 10/31/2019 for volume overload and pulmonary edema, who also has a history of hypertension anemia of ESRD who presents to the emergency room with a complaint of coughing up small amounts of blood mixed with phlegm about a quarter sized.He has mild assoicated shortness of breath.  Patient had dialysis earlier in the day of arrival but cut his session short to 40 minutes because of pruritus.  He apparently had 1 kg of fluid removed instead of 4 and is currently above dry weight.  He denies chest pain, denies fever or chills.  During his recent past hospitalization he was having a.m. hemoptysis that was thought related to sinus congestion. ED Course: On arrival in the emergency room he was afebrile with blood pressure 149/81, HR 104, O2 sat 91% on room air.  On his blood work hemoglobin was 7.2 which is stable from recent past hospitalization.  Otherwise blood work expected for dialysis patient.  Potassium normal bicarb normal.  His chest x-ray showed mild pulmonary vascular congestion.  He underwent a CT angiogram of the chest that showed no evidence of pulmonary embolus but did show developing pulmonary edema.  EKG showed no acute ST-T wave changes.  Hospitalist consulted for admission. Review of Systems: As per HPI otherwise 10 point review of systems negative.    Past Medical History:  Diagnosis Date  . Anemia   . CKD (chronic kidney disease)    Stage 5  Dialysis - M/W/F in [place], [place]  . Dyspnea    tx with inhaler when sick  . ED  (erectile dysfunction)   . ETOH abuse   . History of blood transfusion   . Hypertension   . Wears dentures     Past Surgical History:  Procedure Laterality Date  . BASCILIC VEIN TRANSPOSITION Left 08/07/2016   Procedure: LEFT BASILIC VEIN TRANSPOSITION;  Surgeon: Angelia Mould, MD;  Location: [facility];  Service: Vascular;  Laterality: Left;  . CLOSED REDUCTION NASAL FRACTURE N/A 08/11/2019   Procedure: CLOSED REDUCTION NASAL FRACTURE WITH STABILIZATION;  Surgeon: Irene Limbo, MD;  Location: [facility];  Service: Plastics;  Laterality: N/A;  . COLONOSCOPY N/A 08/12/2016   Procedure: COLONOSCOPY;  Surgeon: Otis Brace, MD;  Location: [facility];  Service: Gastroenterology;  Laterality: N/A;  . ESOPHAGOGASTRODUODENOSCOPY N/A 08/12/2016   Procedure: ESOPHAGOGASTRODUODENOSCOPY (EGD);  Surgeon: Otis Brace, MD;  Location: [facility];  Service: Gastroenterology;  Laterality: N/A;  . INSERTION OF DIALYSIS CATHETER N/A 08/07/2016   Procedure: INSERTION OF TUNNELED DIALYSIS CATHETER;  Surgeon: Angelia Mould, MD;  Location: [facility];  Service: Vascular;  Laterality: N/A;  . LIGATION OF ARTERIOVENOUS  FISTULA Left 09/12/2016   Procedure: BANDING OF LEFT  ARTERIOVENOUS  FISTULA;  Surgeon: Angelia Mould, MD;  Location: [facility];  Service: Vascular;  Laterality: Left;  . LOWER EXTREMITY INTERVENTION Right 12/02/2018   Procedure: LOWER EXTREMITY INTERVENTION;  Surgeon: Algernon Huxley, MD;  Location: [facility];  Service: Cardiovascular;  Laterality: Right;     reports that he has been smoking cigarettes. He has  a 20.00 pack-year smoking history. He has never used smokeless tobacco. He reports current alcohol use of about 21.0 standard drinks of alcohol per week. He reports that he does not use drugs.  No Known Allergies  Family History  Problem Relation Age of Onset  . Diabetes Mother   . Kidney failure Mother   . Healthy Father   . Kidney failure Brother   .  Healthy Sister   . Kidney disease Daughter   . Post-traumatic stress disorder Neg Hx   . Bladder Cancer Neg Hx   . Kidney cancer Neg Hx      Prior to Admission medications   Medication Sig Start Date End Date Taking? Authorizing Provider  albuterol (PROVENTIL HFA;VENTOLIN HFA) 108 (90 Base) MCG/ACT inhaler Inhale 2 puffs into the lungs every 4 (four) hours as needed for wheezing or shortness of breath. 10/03/18   Orpah Greek, MD  calcitRIOL (ROCALTROL) 0.5 MCG capsule Take 1 capsule (0.5 mcg total) by mouth every Monday, Wednesday, and Friday with hemodialysis. 10/24/19   Pokhrel, Corrie Mckusick, MD  calcium acetate (PHOSLO) 667 MG capsule Take 2 capsules (1,334 mg total) by mouth 3 (three) times daily with meals. 02/12/19   Fritzi Mandes, MD  hydrOXYzine (ATARAX/VISTARIL) 50 MG tablet Take 50 mg by mouth 3 (three) times daily. 08/09/19   [provider]  nicotine (NICODERM CQ - DOSED IN MG/24 HOURS) 14 mg/24hr patch Place 1 patch (14 mg total) onto the skin daily. 10/08/19   Black, Lezlie Octave, NP  sildenafil (VIAGRA) 50 MG tablet Take 50 mg by mouth daily as needed for erectile dysfunction. 07/20/19   [provider]  triamcinolone (KENALOG) 0.025 % ointment Apply 1 application topically 2 (two) times daily. 10/23/19   Flora Lipps, MD    Physical Exam: Vitals:   11/11/19 2113 11/11/19 2114  BP: (!) 149/81   Pulse: (!) 104   Resp: 18   Temp: 98.7 F (37.1 C)   TempSrc: Oral   SpO2: 91%   Weight:  72.6 kg  Height:  5\' 4"  (1.626 m)     Vitals:   11/11/19 2113 11/11/19 2114  BP: (!) 149/81   Pulse: (!) 104   Resp: 18   Temp: 98.7 F (37.1 C)   TempSrc: Oral   SpO2: 91%   Weight:  72.6 kg  Height:  5\' 4"  (1.626 m)    Constitutional: NAD, alert and oriented x 3 Eyes: PERRL, lids with mile palpebral edemaand conjunctivae normal ENMT: Mucous membranes are moist.  Neck: normal, supple, no masses, no thyromegaly Respiratory:diminished at bases with few rales,  no wheezing, no crackles. Normal respiratory effort. No accessory muscle use.  Cardiovascular: Regular rate and rhythm, no murmurs / rubs / gallops. No extremity edema. 2+ pedal pulses. No carotid bruits.  Abdomen: no tenderness, no masses palpated. No hepatosplenomegaly. Bowel sounds positive.  Musculoskeletal: no clubbing / cyanosis. No joint deformity upper and lower extremities.  Skin: no rashes, lesions, ulcers.  Neurologic: No gross focal neurologic deficit. Psychiatric: Normal mood and affect.   Labs on Admission: I have personally reviewed following labs and imaging studies  CBC: Recent Labs  Lab 11/11/19 2118  WBC 8.4  NEUTROABS 6.5  HGB 7.2*  HCT 23.6*  MCV 110.3*  PLT [id number]*   Basic Metabolic Panel: Recent Labs  Lab 11/11/19 2118  NA 143  K 3.8  CL 98  CO2 32  GLUCOSE 117*  BUN 25*  CREATININE 6.87*  CALCIUM 8.6*   GFR:  Estimated Creatinine Clearance: 11.5 mL/min (A) (by C-G formula based on SCr of 6.87 mg/dL (H)). Liver Function Tests: No results for input(s): AST, ALT, ALKPHOS, BILITOT, PROT, ALBUMIN in the last 168 hours. No results for input(s): LIPASE, AMYLASE in the last 168 hours. No results for input(s): AMMONIA in the last 168 hours. Coagulation Profile: No results for input(s): INR, PROTIME in the last 168 hours. Cardiac Enzymes: No results for input(s): CKTOTAL, CKMB, CKMBINDEX, TROPONINI in the last 168 hours. BNP (last 3 results) No results for input(s): PROBNP in the last 8760 hours. HbA1C: No results for input(s): HGBA1C in the last 72 hours. CBG: No results for input(s): GLUCAP in the last 168 hours. Lipid Profile: No results for input(s): CHOL, HDL, LDLCALC, TRIG, CHOLHDL, LDLDIRECT in the last 72 hours. Thyroid Function Tests: No results for input(s): TSH, T4TOTAL, FREET4, T3FREE, THYROIDAB in the last 72 hours. Anemia Panel: No results for input(s): VITAMINB12, FOLATE, FERRITIN, TIBC, IRON, RETICCTPCT in the last 72 hours. Urine  analysis:    Component Value Date/Time   COLORURINE STRAW (A) 08/04/2016 0448   APPEARANCEUR CLOUDY (A) 08/04/2016 0448   LABSPEC 1.011 08/04/2016 0448   PHURINE 5.5 08/04/2016 0448   GLUCOSEU 100 (A) 08/04/2016 0448   HGBUR SMALL (A) 08/04/2016 0448   BILIRUBINUR NEGATIVE 08/04/2016 0448   KETONESUR NEGATIVE 08/04/2016 0448   PROTEINUR >300 (A) 08/04/2016 0448   NITRITE NEGATIVE 08/04/2016 0448   LEUKOCYTESUR NEGATIVE 08/04/2016 0448    Radiological Exams on Admission: DG Chest 2 View  Result Date: 11/11/2019 CLINICAL DATA:  54 year old male with shortness of breath. EXAM: CHEST - 2 VIEW COMPARISON:  Chest radiograph dated 11/01/2019. FINDINGS: There is vascular congestion and mild edema. Superimposed pneumonia is not excluded. Clinical correlation is recommended. No lobar consolidation, pleural effusion, pneumothorax. There is atherosclerotic calcification of the aorta. Stable cardiac silhouette. No acute osseous pathology. IMPRESSION: Mild edema. Pneumonia is not excluded. Clinical correlation is recommended. Electronically Signed   By: Anner Crete M.D.   On: 11/11/2019 21:41   CT Angio Chest PE W/Cm &/Or Wo Cm  Result Date: 11/12/2019 CLINICAL DATA:  Shortness of breath. End-stage renal disease on hemodialysis. Hemoptysis. EXAM: CT ANGIOGRAPHY CHEST WITH CONTRAST TECHNIQUE: Multidetector CT imaging of the chest was performed using the standard protocol during bolus administration of intravenous contrast. Multiplanar CT image reconstructions and MIPs were obtained to evaluate the vascular anatomy. CONTRAST:  66mL OMNIPAQUE IOHEXOL 350 MG/ML SOLN COMPARISON:  07/22/2019 FINDINGS: Cardiovascular: Contrast injection is sufficient to demonstrate satisfactory opacification of the pulmonary arteries to the segmental level. There is no pulmonary embolus. The heart size is enlarged. Aortic calcifications are noted without evidence for aneurysm. Coronary artery calcifications are noted. There  is no significant pericardial effusion. Mediastinum/Nodes: --the gland there is a subcarinal lymph node measuring approximately 1.7 cm. This has increased 3D since the prior study. There are few additional prominent mediastinal and hilar lymph nodes. --No axillary lymphadenopathy. --No supraclavicular lymphadenopathy. --Normal thyroid gland. --The esophagus is unremarkable Lungs/Pleura: Emphysematous changes are noted bilaterally. There is developing interlobular septal thickening. There is no pneumothorax or large pleural effusion. Upper Abdomen: No acute abnormality. 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 Review of the MIP images confirms the above findings. IMPRESSION: 1. No evidence for acute pulmonary embolus. 2. Cardiomegaly with developing interlobular septal thickening, which is favored to represent developing pulmonary edema. 3. Emphysematous changes. 4. Nonspecific mildly enlarged mediastinal and hilar lymph nodes, increased in size compared to the prior study. These  are favored to be reactive. 5. Aortic Atherosclerosis (ICD10-I70.0) and Emphysema (ICD10-J43.9). Electronically Signed   By: Constance Holster M.D.   On: 11/12/2019 01:04    EKG: Independently reviewed.   Assessment/Plan Active Problems:    Volume overload secondary to the partial dialysis session and patient with poor compliance with dialysis  ESRD on dialysis [facility]) -Nephrology consult for continuation of dialysis -Fluid restriction pending dialysis -Lasix able to produce urine -Continue Rocaltrol, PhosLo   Hemoptysis -CTA chest negative for PE or acute pulmonary process -Concur that hemoptysis might be ENT, per recent past hospitalization -Can consider outpatient referral to ENT and/or pulmonology  Chronic pruritus -Continue hydroxyzine  Anemia in ESRD (end-stage renal disease) ([facility]) -Hemoglobin 7.2, baseline plan-continue to monitor      DVT prophylaxis: heparin  Code Status: full  code  Family Communication: none  Disposition Plan: Back to previous home environment Consults called: Nephrology, Dr. Florene Glen MD [facility]     11/12/2019, 2:23 AM

## 2019-11-12 NOTE — Progress Notes (Signed)
HDU RN called stating pt refusing HDU until an MD sees him about him neck swelling. NAD upon departure from unit moments prior and sats also WNL. Informed that Nephrologist on lunch. Attending notified and arrived at bedside. MD suggested r/t fluid overload and HDU needed. Confirmed by MD and HDU RN that pt now preparing for HDU.

## 2019-11-12 NOTE — Progress Notes (Signed)
Admitted from ED with shortness of breath. No active shortness of breath with oxygen on. Sats decrease when oxygen comes off. Alert and oriented. Oxygen at 1-2 l . He complains of itching. [facility] NP notified

## 2019-11-12 NOTE — Progress Notes (Signed)
Pt dont want to start HD tx unless seen by MD regarding his swollen neck. Communicated to the floor RN and pt is very agitated and verbally abusive.

## 2019-11-12 NOTE — ED Notes (Signed)
Pt transported to CT ?

## 2019-11-12 NOTE — Progress Notes (Signed)
Pt c/o neck swelling. No changes noted since this morning. Previous charting also confirmed facial swelling. Sats are fine on RA and NAD. The only med given this shift is Phoslo at lunch, which he takes at home, and he requested to only take 1 of the 2 pills. When asked if it could be something he ate, he said no and that I was "asking too many questions". I offered to go ahead and give pre-medications, Tylenol and Benadryl, and intended for blood this afternoon in HDU, but pt refused saying benadryl "doesn't do anything". HDU also en route to pick up patient. Educated patient that if it is a fluid problem, that HDU could help with his neck. Charge RN and MD aware. Charge RN arrived to room, reinforcing above.

## 2019-11-13 DIAGNOSIS — I700 Atherosclerosis of aorta: Secondary | ICD-10-CM | POA: Diagnosis not present

## 2019-11-13 DIAGNOSIS — R042 Hemoptysis: Secondary | ICD-10-CM | POA: Diagnosis not present

## 2019-11-13 DIAGNOSIS — N2581 Secondary hyperparathyroidism of renal origin: Secondary | ICD-10-CM | POA: Diagnosis not present

## 2019-11-13 DIAGNOSIS — N186 End stage renal disease: Secondary | ICD-10-CM | POA: Diagnosis not present

## 2019-11-13 DIAGNOSIS — D631 Anemia in chronic kidney disease: Secondary | ICD-10-CM | POA: Diagnosis not present

## 2019-11-13 DIAGNOSIS — E8770 Fluid overload, unspecified: Secondary | ICD-10-CM | POA: Diagnosis not present

## 2019-11-13 DIAGNOSIS — I120 Hypertensive chronic kidney disease with stage 5 chronic kidney disease or end stage renal disease: Secondary | ICD-10-CM | POA: Diagnosis not present

## 2019-11-13 DIAGNOSIS — J9601 Acute respiratory failure with hypoxia: Secondary | ICD-10-CM | POA: Diagnosis not present

## 2019-11-13 LAB — CBC
HCT: 23.5 % — ABNORMAL LOW (ref 39.0–52.0)
Hemoglobin: 7.5 g/dL — ABNORMAL LOW (ref 13.0–17.0)
MCH: 33.3 pg (ref 26.0–34.0)
MCHC: 31.9 g/dL (ref 30.0–36.0)
MCV: 104.4 fL — ABNORMAL HIGH (ref 80.0–100.0)
Platelets: 132 10*3/uL — ABNORMAL LOW (ref 150–400)
RBC: 2.25 MIL/uL — ABNORMAL LOW (ref 4.22–5.81)
RDW: 18.1 % — ABNORMAL HIGH (ref 11.5–15.5)
WBC: 7.7 10*3/uL (ref 4.0–10.5)
nRBC: 0.3 % — ABNORMAL HIGH (ref 0.0–0.2)

## 2019-11-13 NOTE — Progress Notes (Signed)
[facility]  ROUNDING NOTE   Subjective:   Hemodialysis treatment yesterday. Tolerated treatment well. 3 hours. UF of 3 liters.   1 unit PRBC transfusion yesterday.   Objective:  Vital signs in last 24 hours:  Temp:  [98.2 F (36.8 C)-99.4 F (37.4 C)] 98.2 F (36.8 C) (02/21 0501) Pulse Rate:  [91-104] 97 (02/21 0501) Resp:  [17-26] 20 (02/21 0501) BP: (114-153)/(78-97) 142/92 (02/21 0501) SpO2:  [93 %-96 %] 96 % (02/21 0501) Weight:  [69.5 kg-72.7 kg] 69.5 kg (02/20 1915)  Weight change: 0.124 kg Filed Weights   11/12/19 0336 11/12/19 1610 11/12/19 1915  Weight: 71.8 kg 72.7 kg 69.5 kg    Intake/Output: I/O last 3 completed shifts: In: 4 [P.O.:440; Blood:340] Out: 3001 [Other:3000; Stool:1]   Intake/Output this shift:  No intake/output data recorded.  Physical Exam: General: NAD, laying in bed  Head: Normocephalic, atraumatic. Moist oral mucosal membranes  Eyes: Anicteric, PERRL  Neck: Supple, trachea midline  Lungs:  clear  Heart: Regular rate and rhythm  Abdomen:  Soft, nontender,   Extremities: No peripheral edema.  Neurologic: Nonfocal, moving all four extremities  Skin: No lesions  Access: Left AVF    Basic Metabolic Panel: Recent Labs  Lab 11/11/19 2118 11/12/19 0648 11/12/19 1530  NA 143  --  139  K 3.8  --  4.6  CL 98  --  96*  CO2 32  --  28  GLUCOSE 117*  --  109*  BUN 25*  --  36*  CREATININE 6.87* 7.72* 8.89*  CALCIUM 8.6*  --  8.3*  PHOS  --   --  5.2*    Liver Function Tests: Recent Labs  Lab 11/12/19 1530  ALBUMIN 3.2*   No results for input(s): LIPASE, AMYLASE in the last 168 hours. No results for input(s): AMMONIA in the last 168 hours.  CBC: Recent Labs  Lab 11/11/19 2118 11/12/19 0648 11/13/19 0430  WBC 8.4 7.8 7.7  NEUTROABS 6.5  --   --   HGB 7.2* 6.4* 7.5*  HCT 23.6* 21.0* 23.5*  MCV 110.3* 110.5* 104.4*  PLT 142* 121* 132*    Cardiac Enzymes: No results for input(s): CKTOTAL, CKMB,  CKMBINDEX, TROPONINI in the last 168 hours.  BNP: Invalid input(s): POCBNP  CBG: No results for input(s): GLUCAP in the last 168 hours.  Microbiology: Results for orders placed or performed during the hospital encounter of 11/11/19  Respiratory Panel by RT PCR (Flu A&B, Covid) - Nasopharyngeal Swab     Status: None   Collection Time: 11/12/19 12:37 AM   Specimen: Nasopharyngeal Swab  Result Value Ref Range Status   SARS Coronavirus 2 by RT PCR NEGATIVE NEGATIVE Final    Comment: (NOTE) SARS-CoV-2 target nucleic acids are NOT DETECTED. The SARS-CoV-2 RNA is generally detectable in upper respiratoy specimens during the acute phase of infection. The lowest concentration of SARS-CoV-2 viral copies this assay can detect is 131 copies/mL. A negative result does not preclude SARS-Cov-2 infection and should not be used as the sole basis for treatment or other patient management decisions. A negative result may occur with  improper specimen collection/handling, submission of specimen other than nasopharyngeal swab, presence of viral mutation(s) within the areas targeted by this assay, and inadequate number of viral copies (<131 copies/mL). A negative result must be combined with clinical observations, patient history, and epidemiological information. The expected result is Negative. Fact Sheet for Patients:  PinkCheek.be Fact Sheet for Healthcare Providers:  GravelBags.it This test is  not yet ap proved or cleared by the [place] and  has been authorized for detection and/or diagnosis of SARS-CoV-2 by FDA under an Emergency Use Authorization (EUA). This EUA will remain  in effect (meaning this test can be used) for the duration of the COVID-19 declaration under Section 564(b)(1) of the Act, 21 U.S.C. section 360bbb-3(b)(1), unless the authorization is terminated or revoked sooner.    Influenza A by PCR NEGATIVE NEGATIVE  Final   Influenza B by PCR NEGATIVE NEGATIVE Final    Comment: (NOTE) The Xpert Xpress SARS-CoV-2/FLU/RSV assay is intended as an aid in  the diagnosis of influenza from Nasopharyngeal swab specimens and  should not be used as a sole basis for treatment. Nasal washings and  aspirates are unacceptable for Xpert Xpress SARS-CoV-2/FLU/RSV  testing. Fact Sheet for Patients: PinkCheek.be Fact Sheet for Healthcare Providers: GravelBags.it This test is not yet approved or cleared by the [place] FDA and  has been authorized for detection and/or diagnosis of SARS-CoV-2 by  FDA under an Emergency Use Authorization (EUA). This EUA will remain  in effect (meaning this test can be used) for the duration of the  Covid-19 declaration under Section 564(b)(1) of the Act, 21  U.S.C. section 360bbb-3(b)(1), unless the authorization is  terminated or revoked. Performed at [facility], [address] [place], [place] [address]   Culture, blood (routine x 2)     Status: None (Preliminary result)   Collection Time: 11/12/19 12:37 AM   Specimen: BLOOD RIGHT FOREARM  Result Value Ref Range Status   Specimen Description BLOOD RIGHT FOREARM  Final   Special Requests Blood Culture adequate volume  Final   Culture   Final    NO GROWTH 1 DAY Performed at [facility], [address] [place], [place] [address]    Report Status PENDING  Incomplete  Culture, blood (routine x 2)     Status: None (Preliminary result)   Collection Time: 11/12/19 12:37 AM   Specimen: BLOOD RIGHT HAND  Result Value Ref Range Status   Specimen Description BLOOD RIGHT HAND  Final   Special Requests Blood Culture adequate volume  Final   Culture   Final    NO GROWTH 1 DAY Performed at [facility], [address], [place], [place] [address]    Report Status PENDING  Incomplete    Coagulation Studies: No results for input(s):  LABPROT, INR in the last 72 hours.  Urinalysis: No results for input(s): COLORURINE, LABSPEC, PHURINE, GLUCOSEU, HGBUR, BILIRUBINUR, KETONESUR, PROTEINUR, UROBILINOGEN, NITRITE, LEUKOCYTESUR in the last 72 hours.  Invalid input(s): APPERANCEUR    Imaging: DG Chest 2 View  Result Date: 11/11/2019 CLINICAL DATA:  54 year old male with shortness of breath. EXAM: CHEST - 2 VIEW COMPARISON:  Chest radiograph dated 11/01/2019. FINDINGS: There is vascular congestion and mild edema. Superimposed pneumonia is not excluded. Clinical correlation is recommended. No lobar consolidation, pleural effusion, pneumothorax. There is atherosclerotic calcification of the aorta. Stable cardiac silhouette. No acute osseous pathology. IMPRESSION: Mild edema. Pneumonia is not excluded. Clinical correlation is recommended. Electronically Signed   By: Anner Crete M.D.   On: 11/11/2019 21:41   CT Angio Chest PE W/Cm &/Or Wo Cm  Result Date: 11/12/2019 CLINICAL DATA:  Shortness of breath. End-stage renal disease on hemodialysis. Hemoptysis. EXAM: CT ANGIOGRAPHY CHEST WITH CONTRAST TECHNIQUE: Multidetector CT imaging of the chest was performed using the standard protocol during bolus administration of intravenous contrast. Multiplanar CT image reconstructions and MIPs were  obtained to evaluate the vascular anatomy. CONTRAST:  61mL OMNIPAQUE IOHEXOL 350 MG/ML SOLN COMPARISON:  07/22/2019 FINDINGS: Cardiovascular: Contrast injection is sufficient to demonstrate satisfactory opacification of the pulmonary arteries to the segmental level. There is no pulmonary embolus. The heart size is enlarged. Aortic calcifications are noted without evidence for aneurysm. Coronary artery calcifications are noted. There is no significant pericardial effusion. Mediastinum/Nodes: --the gland there is a subcarinal lymph node measuring approximately 1.7 cm. This has increased 3D since the prior study. There are few additional prominent mediastinal  and hilar lymph nodes. --No axillary lymphadenopathy. --No supraclavicular lymphadenopathy. --Normal thyroid gland. --The esophagus is unremarkable Lungs/Pleura: Emphysematous changes are noted bilaterally. There is developing interlobular septal thickening. There is no pneumothorax or large pleural effusion. Upper Abdomen: No acute abnormality. 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 Review of the MIP images confirms the above findings. IMPRESSION: 1. No evidence for acute pulmonary embolus. 2. Cardiomegaly with developing interlobular septal thickening, which is favored to represent developing pulmonary edema. 3. Emphysematous changes. 4. Nonspecific mildly enlarged mediastinal and hilar lymph nodes, increased in size compared to the prior study. These are favored to be reactive. 5. Aortic Atherosclerosis (ICD10-I70.0) and Emphysema (ICD10-J43.9). Electronically Signed   By: Constance Holster M.D.   On: 11/12/2019 01:04     Medications:    . acetaminophen  650 mg Oral Once  . diphenhydrAMINE  25 mg Oral Once  . heparin  5,000 Units Subcutaneous Q8H   acetaminophen **OR** acetaminophen, hydrocortisone cream, senna-docusate  Assessment/ Plan:  [name] is a 54 y.o. black male with end stage renal disease on hemodialysis, hypertension, EtOH abuse, hepatitis B who is admitted to [facility] on 11/11/2019 for Acute pulmonary edema ([place]) [J81.0] SOB (shortness of breath) [R06.02] ESRD (end stage renal disease) on dialysis ([place]) [N18.6, Z99.2] Fluid overload [E87.70] Hemoptysis [R04.2] Anemia, unspecified type [D64.9]  [facility] ([place]) MWF [place]. Left AVF  1. End Stage Renal Disease: only got 1 hour of dialysis treatment on Friday due to itching. He is concerned that this could be a dialyzer reaction, I will review his outpatient dialysis orders prior to treatment tomorrow.   2. Anemia of chronic kidney disease: hemoglobin 7.4,  macrocytic PRBC transfusion 2/20 - EPO with HD treatment  3. Secondary Hyperparathyroidism: with hyperphosphatemia:  Pruritis could be due to hyperphosphatemia but he has this symptom even when phosphorus is at goal.  - calcium acetate with meals.  - hold calcitriol  4. Hypertension: 142/92. Elevated. Not currently on any home blood pressure agents.    LOS: 0 Darrell Hauk 2/21/202111:02 AM

## 2019-11-13 NOTE — Progress Notes (Signed)
I connected with Jordan [name] on 11/13/19 at  3:00 PM EST by video enabled telemedicine visit and verified that I am speaking with the correct person using two identifiers.   I discussed the limitations, risks, security and privacy concerns of performing an evaluation and management service by telemedicine and the availability of in-person appointments. I also discussed with the patient that there may be a patient responsible charge related to this service. The patient expressed understanding and agreed to proceed.  Other persons participating in the visit and their role in the encounter:  home  Patient's location:  work Provider's location:  work  Risk analyst Complaint:  Discuss results fo bloodwork  History of present illness: Patient is a 54 year old African-American male with a past medical history significant for end-stage renal disease on dialysis.  He sees Dr. Posey Pronto from [facility].  He has been referred to [place] for anemia.  Patient has been receiving IV iron as well as Mircera through nephrology and the last dose was given on 113 2021 to 25 mcg.  He was noted to have a hemoglobin of 7.9 in November 2020.  Most recently patient was admitted to the hospital in January for shortness of breath and fluid overload due to noncompliance with dialysis.  He received dialysis in the ER and was discharged.  He also received 1 unit of PRBC.  His hemoglobin at that point improved from 7.4-9.6.  Interval history : other thahn fatigue patient denies any complaints   Review of Systems  Constitutional: Positive for malaise/fatigue. Negative for chills, fever and weight loss.  HENT: Negative for congestion, ear discharge and nosebleeds.   Eyes: Negative for blurred vision.  Respiratory: Negative for cough, hemoptysis, sputum production, shortness of breath and wheezing.   Cardiovascular: Negative for chest pain, palpitations, orthopnea and claudication.  Gastrointestinal: Negative for abdominal  pain, blood in stool, constipation, diarrhea, heartburn, melena, nausea and vomiting.  Genitourinary: Negative for dysuria, flank pain, frequency, hematuria and urgency.  Musculoskeletal: Negative for back pain, joint pain and myalgias.  Skin: Negative for rash.  Neurological: Negative for dizziness, tingling, focal weakness, seizures, weakness and headaches.  Endo/Heme/Allergies: Does not bruise/bleed easily.  Psychiatric/Behavioral: Negative for depression and suicidal ideas. The patient does not have insomnia.     No Known Allergies  Past Medical History:  Diagnosis Date  . Anemia   . Aortic atherosclerosis ([place]) 11/12/2019  . CKD (chronic kidney disease)    Stage 5  Dialysis - M/W/F in [place], [place]  . Dyspnea    tx with inhaler when sick  . ED (erectile dysfunction)   . Emphysema of lung ([place]) 11/12/2019  . ETOH abuse   . History of blood transfusion   . Hypertension   . Wears dentures     Past Surgical History:  Procedure Laterality Date  . BASCILIC VEIN TRANSPOSITION Left 08/07/2016   Procedure: LEFT BASILIC VEIN TRANSPOSITION;  Surgeon: Angelia Mould, MD;  Location: [facility];  Service: Vascular;  Laterality: Left;  . CLOSED REDUCTION NASAL FRACTURE N/A 08/11/2019   Procedure: CLOSED REDUCTION NASAL FRACTURE WITH STABILIZATION;  Surgeon: Irene Limbo, MD;  Location: [facility];  Service: Plastics;  Laterality: N/A;  . COLONOSCOPY N/A 08/12/2016   Procedure: COLONOSCOPY;  Surgeon: Otis Brace, MD;  Location: [facility];  Service: Gastroenterology;  Laterality: N/A;  . ESOPHAGOGASTRODUODENOSCOPY N/A 08/12/2016   Procedure: ESOPHAGOGASTRODUODENOSCOPY (EGD);  Surgeon: Otis Brace, MD;  Location: [facility];  Service: Gastroenterology;  Laterality: N/A;  . INSERTION OF DIALYSIS CATHETER  N/A 08/07/2016   Procedure: INSERTION OF TUNNELED DIALYSIS CATHETER;  Surgeon: Angelia Mould, MD;  Location: [facility];  Service: Vascular;  Laterality: N/A;  . LIGATION  OF ARTERIOVENOUS  FISTULA Left 09/12/2016   Procedure: BANDING OF LEFT  ARTERIOVENOUS  FISTULA;  Surgeon: Angelia Mould, MD;  Location: [facility];  Service: Vascular;  Laterality: Left;  . LOWER EXTREMITY INTERVENTION Right 12/02/2018   Procedure: LOWER EXTREMITY INTERVENTION;  Surgeon: Algernon Huxley, MD;  Location: [facility];  Service: Cardiovascular;  Laterality: Right;    Social History   Socioeconomic History  . Marital status: Single    Spouse name: Not on file  . Number of children: Not on file  . Years of education: Not on file  . Highest education level: Not on file  Occupational History  . Not on file  Tobacco Use  . Smoking status: Current Every Day Smoker    Packs/day: 0.50    Years: 40.00    Pack years: 20.00    Types: Cigarettes  . Smokeless tobacco: Never Used  Substance and Sexual Activity  . Alcohol use: Yes    Alcohol/week: 21.0 standard drinks    Types: 21 Cans of beer per week    Comment: none since 08/04/16  . Drug use: No  . Sexual activity: Not Currently  Other Topics Concern  . Not on file  Social History Narrative  . Not on file   Social Determinants of Health   Financial Resource Strain:   . Difficulty of Paying Living Expenses: Not on file  Food Insecurity:   . Worried About Charity fundraiser in the Last Year: Not on file  . Ran Out of Food in the Last Year: Not on file  Transportation Needs:   . Lack of Transportation (Medical): Not on file  . Lack of Transportation (Non-Medical): Not on file  Physical Activity:   . Days of Exercise per Week: Not on file  . Minutes of Exercise per Session: Not on file  Stress:   . Feeling of Stress : Not on file  Social Connections:   . Frequency of Communication with Friends and Family: Not on file  . Frequency of Social Gatherings with Friends and Family: Not on file  . Attends Religious Services: Not on file  . Active Member of Clubs or Organizations: Not on file  . Attends Theatre manager Meetings: Not on file  . Marital Status: Not on file  Intimate Partner Violence:   . Fear of Current or Ex-Partner: Not on file  . Emotionally Abused: Not on file  . Physically Abused: Not on file  . Sexually Abused: Not on file    Family History  Problem Relation Age of Onset  . Diabetes Mother   . Kidney failure Mother   . Healthy Father   . Kidney failure Brother   . Healthy Sister   . Kidney disease Daughter   . Post-traumatic stress disorder Neg Hx   . Bladder Cancer Neg Hx   . Kidney cancer Neg Hx     No current facility-administered medications for this visit. No current outpatient medications on file.  Facility-Administered Medications Ordered in Other Visits:  .  acetaminophen (TYLENOL) tablet 650 mg, 650 mg, Oral, Q6H PRN **OR** acetaminophen (TYLENOL) suppository 650 mg, 650 mg, Rectal, Q6H PRN, Athena Masse, MD .  acetaminophen (TYLENOL) tablet 650 mg, 650 mg, Oral, Once, Samuella Cota, MD .  diphenhydrAMINE (BENADRYL) capsule 25  mg, 25 mg, Oral, Once, Samuella Cota, MD .  heparin injection 5,000 Units, 5,000 Units, Subcutaneous, Q8H, Athena Masse, MD, 5,000 Units at 11/12/19 0622 .  hydrocortisone cream 1 %, , Topical, PRN, Samuella Cota, MD .  senna-docusate (Senokot-S) tablet 1 tablet, 1 tablet, Oral, QHS PRN, Athena Masse, MD  DG Chest 2 View  Result Date: 11/11/2019 CLINICAL DATA:  54 year old male with shortness of breath. EXAM: CHEST - 2 VIEW COMPARISON:  Chest radiograph dated 11/01/2019. FINDINGS: There is vascular congestion and mild edema. Superimposed pneumonia is not excluded. Clinical correlation is recommended. No lobar consolidation, pleural effusion, pneumothorax. There is atherosclerotic calcification of the aorta. Stable cardiac silhouette. No acute osseous pathology. IMPRESSION: Mild edema. Pneumonia is not excluded. Clinical correlation is recommended. Electronically Signed   By: Anner Crete M.D.   On:  11/11/2019 21:41   CT Angio Chest PE W/Cm &/Or Wo Cm  Result Date: 11/12/2019 CLINICAL DATA:  Shortness of breath. End-stage renal disease on hemodialysis. Hemoptysis. EXAM: CT ANGIOGRAPHY CHEST WITH CONTRAST TECHNIQUE: Multidetector CT imaging of the chest was performed using the standard protocol during bolus administration of intravenous contrast. Multiplanar CT image reconstructions and MIPs were obtained to evaluate the vascular anatomy. CONTRAST:  5m OMNIPAQUE IOHEXOL 350 MG/ML SOLN COMPARISON:  07/22/2019 FINDINGS: Cardiovascular: Contrast injection is sufficient to demonstrate satisfactory opacification of the pulmonary arteries to the segmental level. There is no pulmonary embolus. The heart size is enlarged. Aortic calcifications are noted without evidence for aneurysm. Coronary artery calcifications are noted. There is no significant pericardial effusion. Mediastinum/Nodes: --the gland there is a subcarinal lymph node measuring approximately 1.7 cm. This has increased 3D since the prior study. There are few additional prominent mediastinal and hilar lymph nodes. --No axillary lymphadenopathy. --No supraclavicular lymphadenopathy. --Normal thyroid gland. --The esophagus is unremarkable Lungs/Pleura: Emphysematous changes are noted bilaterally. There is developing interlobular septal thickening. There is no pneumothorax or large pleural effusion. Upper Abdomen: No acute abnormality. 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 Review of the MIP images confirms the above findings. IMPRESSION: 1. No evidence for acute pulmonary embolus. 2. Cardiomegaly with developing interlobular septal thickening, which is favored to represent developing pulmonary edema. 3. Emphysematous changes. 4. Nonspecific mildly enlarged mediastinal and hilar lymph nodes, increased in size compared to the prior study. These are favored to be reactive. 5. Aortic Atherosclerosis (ICD10-I70.0) and Emphysema  (ICD10-J43.9). Electronically Signed   By: CConstance HolsterM.D.   On: 11/12/2019 01:04   DG CHEST PORT 1 VIEW  Result Date: 11/01/2019 CLINICAL DATA:  Shortness of breath. Pulmonary edema. EXAM: PORTABLE CHEST 1 VIEW COMPARISON:  10/31/2019 and 10/22/2019 FINDINGS: The diffuse bilateral pulmonary edema has improved. Heart size is normal considering the AP portable technique. Slight pulmonary vascular prominence. Aortic atherosclerosis. No effusions. No bone abnormality. IMPRESSION: Improving pulmonary edema. Aortic Atherosclerosis (ICD10-I70.0). Electronically Signed   By: JLorriane ShireM.D.   On: 11/01/2019 10:21   DG Chest Portable 1 View  Result Date: 10/31/2019 CLINICAL DATA:  Shortness of breath and chest pain. EXAM: PORTABLE CHEST 1 VIEW COMPARISON:  10/22/2019 FINDINGS: Artifact overlies the chest. The heart is enlarged. There is aortic atherosclerosis. Widespread opacity likely reflects interstitial and alveolar pulmonary edema. No visible effusion at this time. No acute bone finding. IMPRESSION: Acute pulmonary edema. Electronically Signed   By: MNelson ChimesM.D.   On: 10/31/2019 03:41   DG Chest Portable 1 View  Result Date: 10/22/2019 CLINICAL DATA:  Initial evaluation  for acute shortness of breath. EXAM: PORTABLE CHEST 1 VIEW COMPARISON:  Prior radiograph from 10/05/2019. FINDINGS: Cardiomegaly, stable. Mediastinal silhouette within normal limits. Aortic atherosclerosis. Lungs normally inflated. Diffuse vascular congestion with interstitial prominence, suggesting pulmonary interstitial edema. No consolidative opacity. No pleural effusion. No pneumothorax. No acute osseous finding. IMPRESSION: 1. Cardiomegaly with diffuse pulmonary vascular congestion and interstitial prominence, suggesting pulmonary interstitial edema. 2.  Aortic Atherosclerosis (ICD10-I70.0). Electronically Signed   By: Jeannine Boga M.D.   On: 10/22/2019 04:26    No images are attached to the encounter.   CMP  Latest Ref Rng & Units 11/12/2019  Glucose 70 - 99 mg/dL 109(H)  BUN 6 - 20 mg/dL 36(H)  Creatinine 0.61 - 1.24 mg/dL 8.89(H)  Sodium 135 - 145 mmol/L 139  Potassium 3.5 - 5.1 mmol/L 4.6  Chloride 98 - 111 mmol/L 96(L)  CO2 22 - 32 mmol/L 28  Calcium 8.9 - 10.3 mg/dL 8.3(L)  Total Protein 6.5 - 8.1 g/dL -  Total Bilirubin 0.3 - 1.2 mg/dL -  Alkaline Phos 38 - 126 U/L -  AST 15 - 41 U/L -  ALT 0 - 44 U/L -   CBC Latest Ref Rng & Units 11/13/2019  WBC 4.0 - 10.5 K/uL 7.7  Hemoglobin 13.0 - 17.0 g/dL 7.5(L)  Hematocrit 39.0 - 52.0 % 23.5(L)  Platelets 150 - 400 K/uL 132(L)     Observation/objective: appears in no acute distress over video visit today. Breathing is non labored  Assessment and plan:Patient is a 54 year old male with h/o macrocytic anemia possibly due to CKD here to discuss bloodwork results  Patient continues to have significant anemia despite receiving EPO. Anemia work up reveals normal B!2, TSH, b12 and iron studies. Myeloma panel showed no M protein on spep but small iGg lambda on IFE. FLC ratio showsboth appa and lambda are elevated but ratio is normal likely due to CKD. Although retic count is high it is low for the degree of anemia indictaive of hypoproliferative anemia. Haptoglobin mildly low at 27.  Since the cause of anemia is unclear and has not reponded to EPO, I recommend getting a bone marrow biopsy at this time.  He will need coombs test, cold aggultinin, CMP and LDH on the day of his marrow  Follow-up instructions: video visit fater bone marrow results are back.   I discussed the assessment and treatment plan with the patient. The patient was provided an opportunity to ask questions and all were answered. The patient agreed with the plan and demonstrated an understanding of the instructions.   The patient was advised to call back or seek an in-person evaluation if the symptoms worsen or if the condition fails to improve as anticipated.   Visit  Diagnosis: 1. Normocytic anemia   2. Anemia of chronic renal failure, unspecified CKD stage     Dr. Randa Evens, MD, MPH [facility] Tel- [phone] 11/13/2019 10:51 AM

## 2019-11-13 NOTE — Discharge Summary (Signed)
Physician Discharge Summary  Jordan [name] [id number] DOB: [birth year] DOA: 11/11/2019  PCP: Jearld Fenton, NP  Admit date: 11/11/2019 Discharge date: 11/13/2019  Recommendations for Outpatient Follow-up:  1. Review outpatient dialysis orders, consider mimicking inpatient dialysis orders to prevent intractable pruritus which has caused the patient to cut short outpatient sessions  Follow-up Information    Jearld Fenton, NP Follow up.   Specialties: Internal Medicine, Emergency Medicine Why: As needed Contact information: [place] [address] [phone]            Discharge Diagnoses: Principal diagnosis is #1 1. Acute hypoxic respiratory failure secondary to volume overload  2. Pruritus with hemodialysis as an outpatient 3. Acute on chronic anemia of CKD 4. Chronic intermittent thrombocytopenia 5. Aortic atherosclerosis and emphysema   Discharge Condition: improved Disposition: home  Diet recommendation: heart healthy, limit fluid intake as per nephrologist  [facility] Weights   11/12/19 0336 11/12/19 1610 11/12/19 1915  Weight: 71.8 kg 72.7 kg 69.5 kg    History of present illness:  54 year old man PMH ESRD presented with shortness of breath, hemoptysis, was admitted for acute hypoxic respiratory failure secondary to volume overload secondary to poor compliance with hemodialysis.  Hospital Course:  Patient was admitted, seen by nephrology, underwent hemodialysis with resolution of hypoxic respiratory failure and decrease in volume overload.  Now asymptomatic and desires discharge home.  Discussed with nephrology, cleared for discharge.  Hospitalization was uncomplicated.  Patient does report itching at outpatient hemodialysis that is so extreme he has to cut off treatments.  However he had no itching during his inpatient hemodialysis.  Nephrology is looking into his outpatient hemodialysis and will see if inpatient method can be mimicked in  the outpatient setting.  Acute hypoxic respiratory failure secondary to volume overload secondary to poor compliance with hemodialysis, partial dialysis session as an outpatient. --Resolved with hemodialysis  Acute on chronic anemia of CKD.  No evidence of ongoing bleeding.  Although low volume hemoptysis was reported, the patient's history is suggestive of benign and minimal hemoptysis.  I do not think this has anything to do with his anemia.  Review of his chart is notable for hemoglobin 7-8. --Hemoglobin appropriately increased with PRBC transfusion.  Patient may consider with hemodialysis.  Low volume hemoptysis reported.  Patient's description is really tiny bit of blood when he coughed yesterday.  CTA chest negative for acute process.  Patient denies epistaxis. --Seems to have resolved.  I doubt this was clinically significant.  Could be related to occult epistaxis for even cough.  There is no history to suggest that this is clinically significant at this point.  Outpatient follow-up with workers.  Chronic intermittent thrombocytopenia, appears stable, follow-up as an outpatient  Aortic atherosclerosis and emphysema seen on chest CT --Follow-up as an outpatient  Significant Hospital Events    2/19 admitted for volume overload, acute hypoxic respiratory failure  Consults:   Nephrology  Procedures:   Hemodialysis  Significant Diagnostic Tests:   SARS-CoV-2 negative  Chest x-ray mild edema.  CT chest no PE.  Pulmonary edema.  Today's assessment: S: Feels a lot better today.  Swelling in the face has gone down.  Breathing fine.  No difficulty swallowing.  Breathing overall.  He does wonder why he itches at outpatient hemodialysis but did not itch here. O: Vitals:  Vitals:   11/12/19 1938 11/13/19 0501  BP: (!) 147/94 (!) 142/92  Pulse: (!) 104 97  Resp: 20 20  Temp: 98.8  F (37.1 C) 98.2 F (36.8 C)  SpO2: 93% 96%    Constitutional:  . Appears calm and  comfortable Eyes:  Marland Kitchen Decreased right eyelid edema ENMT:  . grossly normal hearing  . Decreased facial edema and edema below the jawline Neck:  . Decreased edema Respiratory:  . CTA bilaterally, no w/r/r.  . Respiratory effort normal. Cardiovascular:  . RRR, no m/r/g . No LE extremity edema   Psychiatric:  . Mental status o Mood, affect appropriate  Hemoglobin improved appropriately status post transfusion, 7.5.  Platelets stable at 132.  Discharge Instructions  Discharge Instructions    Activity as tolerated - No restrictions   Complete by: As directed    Diet - low sodium heart healthy   Complete by: As directed    Discharge instructions   Complete by: As directed    Call your physician or seek immediate medical attention for swelling, pain, shortness of breath, dizziness, lightheadedness, passing out, difficulty swallowing, intractable itching or worsening of condition.  Patient to limit your fluid intake as advised by your nephrologist.     Allergies as of 11/13/2019   No Known Allergies     Medication List    TAKE these medications   acetaminophen 500 MG tablet Commonly known as: TYLENOL Take 1,000 mg by mouth every 6 (six) hours as needed.   albuterol 108 (90 Base) MCG/ACT inhaler Commonly known as: VENTOLIN HFA Inhale 2 puffs into the lungs every 4 (four) hours as needed for wheezing or shortness of breath.   calcitRIOL 0.5 MCG capsule Commonly known as: ROCALTROL Take 1 capsule (0.5 mcg total) by mouth every Monday, Wednesday, and Friday with hemodialysis.   calcium acetate 667 MG capsule Commonly known as: PHOSLO Take 2 capsules (1,334 mg total) by mouth 3 (three) times daily with meals.   hydrOXYzine 50 MG tablet Commonly known as: ATARAX/VISTARIL Take 50 mg by mouth 3 (three) times daily.   nicotine 14 mg/24hr patch Commonly known as: NICODERM CQ - dosed in mg/24 hours Place 1 patch (14 mg total) onto the skin daily.   sildenafil 50 MG  tablet Commonly known as: VIAGRA Take 50 mg by mouth daily as needed for erectile dysfunction.   triamcinolone 0.025 % ointment Commonly known as: KENALOG Apply 1 application topically 2 (two) times daily.      No Known Allergies  The results of significant diagnostics from this hospitalization (including imaging, microbiology, ancillary and laboratory) are listed below for reference.    Significant Diagnostic Studies: DG Chest 2 View  Result Date: 11/11/2019 CLINICAL DATA:  54 year old male with shortness of breath. EXAM: CHEST - 2 VIEW COMPARISON:  Chest radiograph dated 11/01/2019. FINDINGS: There is vascular congestion and mild edema. Superimposed pneumonia is not excluded. Clinical correlation is recommended. No lobar consolidation, pleural effusion, pneumothorax. There is atherosclerotic calcification of the aorta. Stable cardiac silhouette. No acute osseous pathology. IMPRESSION: Mild edema. Pneumonia is not excluded. Clinical correlation is recommended. Electronically Signed   By: Anner Crete M.D.   On: 11/11/2019 21:41   CT Angio Chest PE W/Cm &/Or Wo Cm  Result Date: 11/12/2019 CLINICAL DATA:  Shortness of breath. End-stage renal disease on hemodialysis. Hemoptysis. EXAM: CT ANGIOGRAPHY CHEST WITH CONTRAST TECHNIQUE: Multidetector CT imaging of the chest was performed using the standard protocol during bolus administration of intravenous contrast. Multiplanar CT image reconstructions and MIPs were obtained to evaluate the vascular anatomy. CONTRAST:  82mL OMNIPAQUE IOHEXOL 350 MG/ML SOLN COMPARISON:  07/22/2019 FINDINGS: Cardiovascular: Contrast injection  is sufficient to demonstrate satisfactory opacification of the pulmonary arteries to the segmental level. There is no pulmonary embolus. The heart size is enlarged. Aortic calcifications are noted without evidence for aneurysm. Coronary artery calcifications are noted. There is no significant pericardial effusion.  Mediastinum/Nodes: --the gland there is a subcarinal lymph node measuring approximately 1.7 cm. This has increased 3D since the prior study. There are few additional prominent mediastinal and hilar lymph nodes. --No axillary lymphadenopathy. --No supraclavicular lymphadenopathy. --Normal thyroid gland. --The esophagus is unremarkable Lungs/Pleura: Emphysematous changes are noted bilaterally. There is developing interlobular septal thickening. There is no pneumothorax or large pleural effusion. Upper Abdomen: No acute abnormality. 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 Review of the MIP images confirms the above findings. IMPRESSION: 1. No evidence for acute pulmonary embolus. 2. Cardiomegaly with developing interlobular septal thickening, which is favored to represent developing pulmonary edema. 3. Emphysematous changes. 4. Nonspecific mildly enlarged mediastinal and hilar lymph nodes, increased in size compared to the prior study. These are favored to be reactive. 5. Aortic Atherosclerosis (ICD10-I70.0) and Emphysema (ICD10-J43.9). Electronically Signed   By: Constance Holster M.D.   On: 11/12/2019 01:04   Microbiology: Recent Results (from the past 240 hour(s))  Respiratory Panel by RT PCR (Flu A&B, Covid) - Nasopharyngeal Swab     Status: None   Collection Time: 11/12/19 12:37 AM   Specimen: Nasopharyngeal Swab  Result Value Ref Range Status   SARS Coronavirus 2 by RT PCR NEGATIVE NEGATIVE Final    Comment: (NOTE) SARS-CoV-2 target nucleic acids are NOT DETECTED. The SARS-CoV-2 RNA is generally detectable in upper respiratoy specimens during the acute phase of infection. The lowest concentration of SARS-CoV-2 viral copies this assay can detect is 131 copies/mL. A negative result does not preclude SARS-Cov-2 infection and should not be used as the sole basis for treatment or other patient management decisions. A negative result may occur with  improper specimen  collection/handling, submission of specimen other than nasopharyngeal swab, presence of viral mutation(s) within the areas targeted by this assay, and inadequate number of viral copies (<131 copies/mL). A negative result must be combined with clinical observations, patient history, and epidemiological information. The expected result is Negative. Fact Sheet for Patients:  PinkCheek.be Fact Sheet for Healthcare Providers:  GravelBags.it This test is not yet ap proved or cleared by the [place] FDA and  has been authorized for detection and/or diagnosis of SARS-CoV-2 by FDA under an Emergency Use Authorization (EUA). This EUA will remain  in effect (meaning this test can be used) for the duration of the COVID-19 declaration under Section 564(b)(1) of the Act, 21 U.S.C. section 360bbb-3(b)(1), unless the authorization is terminated or revoked sooner.    Influenza A by PCR NEGATIVE NEGATIVE Final   Influenza B by PCR NEGATIVE NEGATIVE Final    Comment: (NOTE) The Xpert Xpress SARS-CoV-2/FLU/RSV assay is intended as an aid in  the diagnosis of influenza from Nasopharyngeal swab specimens and  should not be used as a sole basis for treatment. Nasal washings and  aspirates are unacceptable for Xpert Xpress SARS-CoV-2/FLU/RSV  testing. Fact Sheet for Patients: PinkCheek.be Fact Sheet for Healthcare Providers: GravelBags.it This test is not yet approved or cleared by the [place] FDA and  has been authorized for detection and/or diagnosis of SARS-CoV-2 by  FDA under an Emergency Use Authorization (EUA). This EUA will remain  in effect (meaning this test can be used) for the duration of the  Covid-19 declaration under Section 564(b)(1)  of the Act, 21  U.S.C. section 360bbb-3(b)(1), unless the authorization is  terminated or revoked. Performed at [facility],  [address] [place], [place] [address]   Culture, blood (routine x 2)     Status: None (Preliminary result)   Collection Time: 11/12/19 12:37 AM   Specimen: BLOOD RIGHT FOREARM  Result Value Ref Range Status   Specimen Description BLOOD RIGHT FOREARM  Final   Special Requests Blood Culture adequate volume  Final   Culture   Final    NO GROWTH 1 DAY Performed at [facility], [address] [place],  [address]    Report Status PENDING  Incomplete  Culture, blood (routine x 2)     Status: None (Preliminary result)   Collection Time: 11/12/19 12:37 AM   Specimen: BLOOD RIGHT HAND  Result Value Ref Range Status   Specimen Description BLOOD RIGHT HAND  Final   Special Requests Blood Culture adequate volume  Final   Culture   Final    NO GROWTH 1 DAY Performed at [facility], [address] [place],  [address]    Report Status PENDING  Incomplete     Labs: Basic Metabolic Panel: Recent Labs  Lab 11/11/19 2118 11/12/19 0648 11/12/19 1530  NA 143  --  139  K 3.8  --  4.6  CL 98  --  96*  CO2 32  --  28  GLUCOSE 117*  --  109*  BUN 25*  --  36*  CREATININE 6.87* 7.72* 8.89*  CALCIUM 8.6*  --  8.3*  PHOS  --   --  5.2*   Liver Function Tests: Recent Labs  Lab 11/12/19 1530  ALBUMIN 3.2*   CBC: Recent Labs  Lab 11/11/19 2118 11/12/19 0648 11/13/19 0430  WBC 8.4 7.8 7.7  NEUTROABS 6.5  --   --   HGB 7.2* 6.4* 7.5*  HCT 23.6* 21.0* 23.5*  MCV 110.3* 110.5* 104.4*  PLT 142* 121* 132*    Principal Problem:   Volume overload Active Problems:   ESRD on dialysis (HCC)   Essential hypertension   Anemia in ESRD (end-stage renal disease) (HCC)   Hemoptysis   Fluid overload   Acute respiratory failure with hypoxia (HCC)   Aortic atherosclerosis (HCC)   Emphysema of lung ([place])   Time coordinating discharge: 84minutes  Signed:  Murray Hodgkins, MD  [facility]  11/13/2019, 11:17 AM

## 2019-11-13 NOTE — Plan of Care (Signed)

## 2019-11-13 NOTE — Plan of Care (Signed)
  Problem: Education: Goal: Knowledge of General Education information will improve Description: Including pain rating scale, medication(s)/side effects and non-pharmacologic comfort measures 11/13/2019 1128 by Blenda Bridegroom, RN Outcome: Completed/Met 11/13/2019 0750 by Blenda Bridegroom, RN Outcome: Progressing   Problem: Health Behavior/Discharge Planning: Goal: Ability to manage health-related needs will improve 11/13/2019 1128 by Blenda Bridegroom, RN Outcome: Completed/Met 11/13/2019 0750 by Blenda Bridegroom, RN Outcome: Progressing   Problem: Clinical Measurements: Goal: Ability to maintain clinical measurements within normal limits will improve 11/13/2019 1128 by Blenda Bridegroom, RN Outcome: Completed/Met 11/13/2019 0750 by Blenda Bridegroom, RN Outcome: Progressing Goal: Will remain free from infection 11/13/2019 1128 by Blenda Bridegroom, RN Outcome: Completed/Met 11/13/2019 0750 by Blenda Bridegroom, RN Outcome: Progressing Goal: Diagnostic test results will improve 11/13/2019 1128 by Blenda Bridegroom, RN Outcome: Completed/Met 11/13/2019 0750 by Blenda Bridegroom, RN Outcome: Progressing Goal: Respiratory complications will improve 11/13/2019 1128 by Blenda Bridegroom, RN Outcome: Completed/Met 11/13/2019 0750 by Blenda Bridegroom, RN Outcome: Progressing Goal: Cardiovascular complication will be avoided 11/13/2019 1128 by Blenda Bridegroom, RN Outcome: Completed/Met 11/13/2019 0750 by Blenda Bridegroom, RN Outcome: Progressing   Problem: Activity: Goal: Risk for activity intolerance will decrease 11/13/2019 1128 by Blenda Bridegroom, RN Outcome: Completed/Met 11/13/2019 0750 by Blenda Bridegroom, RN Outcome: Progressing   Problem: Nutrition: Goal: Adequate nutrition will be maintained 11/13/2019 1128 by Blenda Bridegroom, RN Outcome: Completed/Met 11/13/2019 0750 by Blenda Bridegroom, RN Outcome: Progressing   Problem: Coping: Goal: Level of anxiety will decrease 11/13/2019 1128 by Blenda Bridegroom, RN Outcome:  Completed/Met 11/13/2019 0750 by Blenda Bridegroom, RN Outcome: Progressing   Problem: Elimination: Goal: Will not experience complications related to bowel motility 11/13/2019 1128 by Blenda Bridegroom, RN Outcome: Completed/Met 11/13/2019 0750 by Blenda Bridegroom, RN Outcome: Progressing Goal: Will not experience complications related to urinary retention 11/13/2019 1128 by Blenda Bridegroom, RN Outcome: Completed/Met 11/13/2019 0750 by Blenda Bridegroom, RN Outcome: Progressing   Problem: Pain Managment: Goal: General experience of comfort will improve 11/13/2019 1128 by Blenda Bridegroom, RN Outcome: Completed/Met 11/13/2019 0750 by Blenda Bridegroom, RN Outcome: Progressing   Problem: Safety: Goal: Ability to remain free from injury will improve 11/13/2019 1128 by Blenda Bridegroom, RN Outcome: Completed/Met 11/13/2019 0750 by Blenda Bridegroom, RN Outcome: Progressing   Problem: Skin Integrity: Goal: Risk for impaired skin integrity will decrease 11/13/2019 1128 by Blenda Bridegroom, RN Outcome: Completed/Met 11/13/2019 0750 by Blenda Bridegroom, RN Outcome: Progressing

## 2019-11-14 DIAGNOSIS — Z992 Dependence on renal dialysis: Secondary | ICD-10-CM | POA: Diagnosis not present

## 2019-11-14 DIAGNOSIS — N186 End stage renal disease: Secondary | ICD-10-CM | POA: Diagnosis not present

## 2019-11-14 DIAGNOSIS — L299 Pruritus, unspecified: Secondary | ICD-10-CM | POA: Diagnosis not present

## 2019-11-14 DIAGNOSIS — R52 Pain, unspecified: Secondary | ICD-10-CM | POA: Diagnosis not present

## 2019-11-14 DIAGNOSIS — D631 Anemia in chronic kidney disease: Secondary | ICD-10-CM | POA: Diagnosis not present

## 2019-11-14 DIAGNOSIS — N2581 Secondary hyperparathyroidism of renal origin: Secondary | ICD-10-CM | POA: Diagnosis not present

## 2019-11-14 LAB — TYPE AND SCREEN
ABO/RH(D): O NEG
Antibody Screen: POSITIVE
Donor AG Type: NEGATIVE
Unit division: 0
Unit division: 0
Unit division: 0

## 2019-11-14 LAB — BPAM RBC
Blood Product Expiration Date: 202102232359
Blood Product Expiration Date: 202102232359
Blood Product Expiration Date: 202103012359
ISSUE DATE / TIME: 202102201637
Unit Type and Rh: 9500
Unit Type and Rh: 9500
Unit Type and Rh: 9500

## 2019-11-14 LAB — PREPARE RBC (CROSSMATCH)

## 2019-11-17 LAB — CULTURE, BLOOD (ROUTINE X 2)
Culture: NO GROWTH
Culture: NO GROWTH
Special Requests: ADEQUATE
Special Requests: ADEQUATE

## 2019-11-18 DIAGNOSIS — D631 Anemia in chronic kidney disease: Secondary | ICD-10-CM | POA: Diagnosis not present

## 2019-11-18 DIAGNOSIS — L299 Pruritus, unspecified: Secondary | ICD-10-CM | POA: Diagnosis not present

## 2019-11-18 DIAGNOSIS — N186 End stage renal disease: Secondary | ICD-10-CM | POA: Diagnosis not present

## 2019-11-18 DIAGNOSIS — Z992 Dependence on renal dialysis: Secondary | ICD-10-CM | POA: Diagnosis not present

## 2019-11-18 DIAGNOSIS — N2581 Secondary hyperparathyroidism of renal origin: Secondary | ICD-10-CM | POA: Diagnosis not present

## 2019-11-18 DIAGNOSIS — R52 Pain, unspecified: Secondary | ICD-10-CM | POA: Diagnosis not present

## 2019-11-20 DIAGNOSIS — N186 End stage renal disease: Secondary | ICD-10-CM | POA: Diagnosis not present

## 2019-11-20 DIAGNOSIS — I129 Hypertensive chronic kidney disease with stage 1 through stage 4 chronic kidney disease, or unspecified chronic kidney disease: Secondary | ICD-10-CM | POA: Diagnosis not present

## 2019-11-20 DIAGNOSIS — Z992 Dependence on renal dialysis: Secondary | ICD-10-CM | POA: Diagnosis not present

## 2019-11-23 ENCOUNTER — Emergency Department (HOSPITAL_COMMUNITY)
Admission: EM | Admit: 2019-11-23 | Discharge: 2019-11-23 | Disposition: A | Discharge status: Home / Self Care | Payer: Medicare Other | Attending: Emergency Medicine | Admitting: Emergency Medicine

## 2019-11-23 DIAGNOSIS — I132 Hypertensive heart and chronic kidney disease with heart failure and with stage 5 chronic kidney disease, or end stage renal disease: Secondary | ICD-10-CM | POA: Diagnosis not present

## 2019-11-23 DIAGNOSIS — N2581 Secondary hyperparathyroidism of renal origin: Secondary | ICD-10-CM | POA: Diagnosis not present

## 2019-11-23 DIAGNOSIS — F17210 Nicotine dependence, cigarettes, uncomplicated: Secondary | ICD-10-CM | POA: Diagnosis not present

## 2019-11-23 DIAGNOSIS — I10 Essential (primary) hypertension: Secondary | ICD-10-CM | POA: Diagnosis not present

## 2019-11-23 DIAGNOSIS — L299 Pruritus, unspecified: Secondary | ICD-10-CM | POA: Diagnosis not present

## 2019-11-23 DIAGNOSIS — N186 End stage renal disease: Secondary | ICD-10-CM | POA: Insufficient documentation

## 2019-11-23 DIAGNOSIS — D631 Anemia in chronic kidney disease: Secondary | ICD-10-CM | POA: Diagnosis not present

## 2019-11-23 DIAGNOSIS — Z79899 Other long term (current) drug therapy: Secondary | ICD-10-CM | POA: Diagnosis not present

## 2019-11-23 DIAGNOSIS — R52 Pain, unspecified: Secondary | ICD-10-CM | POA: Diagnosis not present

## 2019-11-23 DIAGNOSIS — Z992 Dependence on renal dialysis: Secondary | ICD-10-CM | POA: Diagnosis not present

## 2019-11-23 DIAGNOSIS — I509 Heart failure, unspecified: Secondary | ICD-10-CM | POA: Insufficient documentation

## 2019-11-23 DIAGNOSIS — R0902 Hypoxemia: Secondary | ICD-10-CM | POA: Diagnosis not present

## 2019-11-23 DIAGNOSIS — Z743 Need for continuous supervision: Secondary | ICD-10-CM | POA: Diagnosis not present

## 2019-11-23 DIAGNOSIS — D509 Iron deficiency anemia, unspecified: Secondary | ICD-10-CM | POA: Diagnosis not present

## 2019-11-23 LAB — COMPREHENSIVE METABOLIC PANEL
ALT: 13 U/L (ref 0–44)
AST: 17 U/L (ref 15–41)
Albumin: 3.4 g/dL — ABNORMAL LOW (ref 3.5–5.0)
Alkaline Phosphatase: 153 U/L — ABNORMAL HIGH (ref 38–126)
Anion gap: 24 — ABNORMAL HIGH (ref 5–15)
BUN: 90 mg/dL — ABNORMAL HIGH (ref 6–20)
CO2: 20 mmol/L — ABNORMAL LOW (ref 22–32)
Calcium: 9.2 mg/dL (ref 8.9–10.3)
Chloride: 94 mmol/L — ABNORMAL LOW (ref 98–111)
Creatinine, Ser: 19.65 mg/dL — ABNORMAL HIGH (ref 0.61–1.24)
GFR calc Af Amer: 3 mL/min — ABNORMAL LOW (ref >60)
GFR calc non Af Amer: 2 mL/min — ABNORMAL LOW (ref >60)
Glucose, Bld: 97 mg/dL (ref 70–99)
Potassium: 4.6 mmol/L (ref 3.5–5.1)
Sodium: 138 mmol/L (ref 135–145)
Total Bilirubin: 1.2 mg/dL (ref 0.3–1.2)
Total Protein: 6.5 g/dL (ref 6.5–8.1)

## 2019-11-23 LAB — CBC WITH DIFFERENTIAL/PLATELET
Abs Immature Granulocytes: 0 10*3/uL (ref 0.00–0.07)
Basophils Absolute: 0.2 10*3/uL — ABNORMAL HIGH (ref 0.0–0.1)
Basophils Relative: 2 %
Eosinophils Absolute: 0.8 10*3/uL — ABNORMAL HIGH (ref 0.0–0.5)
Eosinophils Relative: 9 %
HCT: 25.8 % — ABNORMAL LOW (ref 39.0–52.0)
Hemoglobin: 8 g/dL — ABNORMAL LOW (ref 13.0–17.0)
Lymphocytes Relative: 14 %
Lymphs Abs: 1.3 10*3/uL (ref 0.7–4.0)
MCH: 34.2 pg — ABNORMAL HIGH (ref 26.0–34.0)
MCHC: 31 g/dL (ref 30.0–36.0)
MCV: 110.3 fL — ABNORMAL HIGH (ref 80.0–100.0)
Monocytes Absolute: 0.3 10*3/uL (ref 0.1–1.0)
Monocytes Relative: 3 %
Neutro Abs: 6.6 10*3/uL (ref 1.7–7.7)
Neutrophils Relative %: 72 %
Platelets: 173 10*3/uL (ref 150–400)
RBC: 2.34 MIL/uL — ABNORMAL LOW (ref 4.22–5.81)
RDW: 17.6 % — ABNORMAL HIGH (ref 11.5–15.5)
WBC: 9.2 10*3/uL (ref 4.0–10.5)
nRBC: 3.5 % — ABNORMAL HIGH (ref 0.0–0.2)
nRBC: 6 /100 WBC — ABNORMAL HIGH

## 2019-11-23 MED ORDER — HYDROXYZINE HCL 25 MG PO TABS
50.0000 mg | ORAL_TABLET | Freq: Once | ORAL | Status: AC
  Administered 2019-11-23: 06:00:00 50 mg via ORAL
  Filled 2019-11-23: qty 2

## 2019-11-23 MED ORDER — TRIAMCINOLONE ACETONIDE 0.025 % EX CREA
TOPICAL_CREAM | Freq: Once | CUTANEOUS | Status: AC
  Administered 2019-11-23: 1 via TOPICAL
  Filled 2019-11-23: qty 15

## 2019-11-23 MED ORDER — CAMPHOR-MENTHOL 0.5-0.5 % EX LOTN
TOPICAL_LOTION | CUTANEOUS | Status: DC | PRN
  Administered 2019-11-23: 1 via TOPICAL
  Filled 2019-11-23: qty 222

## 2019-11-23 MED ORDER — TRIAMCINOLONE ACETONIDE 0.1 % EX CREA: 1.0000 "application " | TOPICAL_CREAM | Freq: Two times a day (BID) | CUTANEOUS | 2 refills | Status: DC

## 2019-11-23 MED ORDER — SARNA 0.5-0.5 % EX LOTN: 1.0000 "application " | TOPICAL_LOTION | CUTANEOUS | 2 refills | Status: DC | PRN

## 2019-11-23 NOTE — ED Notes (Addendum)
Pt given D/C with papers and escorted out by security. Refused to sign.

## 2019-11-23 NOTE — ED Provider Notes (Signed)
Medical screening examination/treatment/procedure(s) were conducted as a shared visit with non-physician practitioner(s) and myself.  I personally evaluated the patient during the encounter.  Patient wanted to see me because he did not want to be discharged.  He is here for itching (which he has been multiple times before).  He has been examined for emergency medical conditions and none are apparent.  Has BUN slightly elevated and has a mild acidosis which could be related to his pruritus however he is to get dialysis later today.  No hyperbilirubinemia.  No evidence of cellulitis or other infection it could be because the pruritus.  No indication for further management or workup in the ED today.  Patient can be discharged to follow up with outpatient physicians for further management of chronic conditions.Merrily Pew, MD 11/23/19 [phone]

## 2019-11-23 NOTE — ED Provider Notes (Signed)
[facility] EMERGENCY DEPARTMENT Provider Note   CSN: [id number] Arrival date & time: 11/23/19  0426     History Chief Complaint  Patient presents with  . Pruritis    Jordan [name] is a 54 y.o. male.  Patient with history of ESRD-HD, HTN, anemia, emphysema presents with generalized itching he feels is from hemodialysis. He initially reported itching for 3 weeks, however, now states that it is a chronic condition. No new symptoms. No rash, fever, pain. He has been prescribed Atarax but does not endorse taking any prescribed medications for itching.   The history is provided by the patient. No language interpreter was used.       Past Medical History:  Diagnosis Date  . Anemia   . Aortic atherosclerosis ([place]) 11/12/2019  . CKD (chronic kidney disease)    Stage 5  Dialysis - M/W/F in [place], [place]  . Dyspnea    tx with inhaler when sick  . ED (erectile dysfunction)   . Emphysema of lung ([place]) 11/12/2019  . ETOH abuse   . History of blood transfusion   . Hypertension   . Wears dentures     Patient Active Problem List   Diagnosis Date Noted  . Fluid overload 11/12/2019  . Acute respiratory failure with hypoxia ([place]) 11/12/2019  . Aortic atherosclerosis ([place]) 11/12/2019  . Emphysema of lung ([place]) 11/12/2019  . Acute pulmonary edema ([place]) 10/31/2019  . Acute on chronic congestive heart failure ([place]) 10/22/2019  . Acute respiratory distress 10/06/2019  . Volume overload 10/05/2019  . Hypokalemia 10/05/2019  . ESRD (end stage renal disease) ([place]) 10/01/2019  . Leg mass, right   . Hyperbilirubinemia   . Prolonged QT interval 09/19/2019  . Symptomatic anemia 09/19/2019  . Pruritus 09/19/2019  . Hemoptysis 02/11/2019  . Hypercalcemia 01/19/2019  . Anemia in ESRD (end-stage renal disease) ([place]) 05/17/2018  . Hepatitis B 03/19/2018  . ESRD on dialysis ([place]) 05/26/2017  . Essential hypertension 05/26/2017  . Urinary tract infection, site not specified  10/17/2016  . Coagulation defect, unspecified ([place]) 08/13/2016  . Diarrhea, unspecified 08/13/2016  . Encounter for immunization 08/13/2016  . Headache, unspecified 08/13/2016  . Hypertensive chronic kidney disease with stage 5 chronic kidney disease or end stage renal disease ([place]) 08/13/2016  . Iron deficiency anemia, unspecified 08/13/2016  . Pain, unspecified 08/13/2016  . Moderate protein-calorie malnutrition ([place]) 08/13/2016  . Secondary hyperparathyroidism of renal origin ([place]) 08/13/2016    Past Surgical History:  Procedure Laterality Date  . BASCILIC VEIN TRANSPOSITION Left 08/07/2016   Procedure: LEFT BASILIC VEIN TRANSPOSITION;  Surgeon: Angelia Mould, MD;  Location: [facility];  Service: Vascular;  Laterality: Left;  . CLOSED REDUCTION NASAL FRACTURE N/A 08/11/2019   Procedure: CLOSED REDUCTION NASAL FRACTURE WITH STABILIZATION;  Surgeon: Irene Limbo, MD;  Location: [facility];  Service: Plastics;  Laterality: N/A;  . COLONOSCOPY N/A 08/12/2016   Procedure: COLONOSCOPY;  Surgeon: Otis Brace, MD;  Location: [facility];  Service: Gastroenterology;  Laterality: N/A;  . ESOPHAGOGASTRODUODENOSCOPY N/A 08/12/2016   Procedure: ESOPHAGOGASTRODUODENOSCOPY (EGD);  Surgeon: Otis Brace, MD;  Location: [facility];  Service: Gastroenterology;  Laterality: N/A;  . INSERTION OF DIALYSIS CATHETER N/A 08/07/2016   Procedure: INSERTION OF TUNNELED DIALYSIS CATHETER;  Surgeon: Angelia Mould, MD;  Location: [facility];  Service: Vascular;  Laterality: N/A;  . LIGATION OF ARTERIOVENOUS  FISTULA Left 09/12/2016   Procedure: BANDING OF LEFT  ARTERIOVENOUS  FISTULA;  Surgeon: Angelia Mould, MD;  Location: [facility]  OR;  Service: Vascular;  Laterality: Left;  . LOWER EXTREMITY INTERVENTION Right 12/02/2018   Procedure: LOWER EXTREMITY INTERVENTION;  Surgeon: Algernon Huxley, MD;  Location: [facility];  Service: Cardiovascular;  Laterality: Right;       Family History   Problem Relation Age of Onset  . Diabetes Mother   . Kidney failure Mother   . Healthy Father   . Kidney failure Brother   . Healthy Sister   . Kidney disease Daughter   . Post-traumatic stress disorder Neg Hx   . Bladder Cancer Neg Hx   . Kidney cancer Neg Hx     Social History   Tobacco Use  . Smoking status: Current Every Day Smoker    Packs/day: 0.50    Years: 40.00    Pack years: 20.00    Types: Cigarettes  . Smokeless tobacco: Never Used  Substance Use Topics  . Alcohol use: Yes    Alcohol/week: 21.0 standard drinks    Types: 21 Cans of beer per week    Comment: none since 08/04/16  . Drug use: No    Home Medications Prior to Admission medications   Medication Sig Start Date End Date Taking? Authorizing Provider  acetaminophen (TYLENOL) 500 MG tablet Take 1,000 mg by mouth every 6 (six) hours as needed. 03/09/19 03/07/20  [provider]  albuterol (PROVENTIL HFA;VENTOLIN HFA) 108 (90 Base) MCG/ACT inhaler Inhale 2 puffs into the lungs every 4 (four) hours as needed for wheezing or shortness of breath. 10/03/18   Orpah Greek, MD  calcitRIOL (ROCALTROL) 0.5 MCG capsule Take 1 capsule (0.5 mcg total) by mouth every Monday, Wednesday, and Friday with hemodialysis. 10/24/19   Pokhrel, Corrie Mckusick, MD  calcium acetate (PHOSLO) 667 MG capsule Take 2 capsules (1,334 mg total) by mouth 3 (three) times daily with meals. 02/12/19   Fritzi Mandes, MD  hydrOXYzine (ATARAX/VISTARIL) 50 MG tablet Take 50 mg by mouth 3 (three) times daily. 08/09/19   [provider]  nicotine (NICODERM CQ - DOSED IN MG/24 HOURS) 14 mg/24hr patch Place 1 patch (14 mg total) onto the skin daily. Patient not taking: Reported on 11/12/2019 10/08/19   Radene Gunning, NP  sildenafil (VIAGRA) 50 MG tablet Take 50 mg by mouth daily as needed for erectile dysfunction. 07/20/19   [provider]  triamcinolone (KENALOG) 0.025 % ointment Apply 1 application topically 2 (two) times daily.  10/23/19   Pokhrel, Corrie Mckusick, MD    Allergies    Patient has no known allergies.  Review of Systems   Review of Systems  Constitutional: Negative for fever.  Skin: Negative for color change and wound.       itching    Physical Exam Updated Vital Signs BP (!) 164/101   Pulse (!) 101   Temp 97.8 F (36.6 C) (Oral)   Resp 18   Ht 5\' 4"  (1.626 m)   Wt 74.8 kg   SpO2 95%   BMI 28.32 kg/m   Physical Exam Vitals and nursing note reviewed.  Constitutional:      Appearance: He is well-developed.  HENT:     Head: Normocephalic.  Cardiovascular:     Rate and Rhythm: Normal rate and regular rhythm.  Pulmonary:     Effort: Pulmonary effort is normal.     Breath sounds: Rales (scattered, diffuse) present.  Abdominal:     General: Bowel sounds are normal.     Palpations: Abdomen is soft.     Tenderness: There  is no abdominal tenderness. There is no guarding or rebound.  Musculoskeletal:        General: Normal range of motion.     Cervical back: Normal range of motion and neck supple.  Skin:    General: Skin is warm and dry.     Findings: No rash.     Comments: Patient actively scratching various locations. No rash visualized. No redness.   Neurological:     Mental Status: He is alert and oriented to person, place, and time.     ED Results / Procedures / Treatments   Labs (all labs ordered are listed, but only abnormal results are displayed) Labs Reviewed  COMPREHENSIVE METABOLIC PANEL  CBC WITH DIFFERENTIAL/PLATELET    EKG None  Radiology No results found.  Procedures Procedures (including critical care time)  Medications Ordered in ED Medications  hydrOXYzine (ATARAX/VISTARIL) tablet 50 mg (50 mg Oral Given 11/23/19 0536)    ED Course  I have reviewed the triage vital signs and the nursing notes.  Pertinent labs & imaging results that were available during my care of the patient were reviewed by me and considered in my medical decision making (see chart for  details).    MDM Rules/Calculators/A&P                      Patient to ED with chronic generalized pruritis  He is not apparently using Atarax, it is unclear. He states he is taking Benadryl for itching. Atarax provided in the ED. Labs pending to insure no significant imbalance.   Atarax did not provide any relief. He has been prescribed Kenalog cream but reports he is not using this medication, "they only give me a little bit". This is ordered in the ED to attempt further relief.   Discussed his itching to be a chronic condition, likely secondary to hemodialysis, and we would try our best to find a solution for his comfort but that if labs are unremarkable he would be able to be discharged home to follow up with his doctor. He states he wants an answer to the problem. He states he wants his doctor contacted, though he has made no attempt to call his doctor over the weeks he has had itching. He states he will not go to dialysis if he is itching.   Kenalog cream ordered. Per Pharmacy recommendation, Sarna cream can be used concomitantly with the Kenalog for best change of providing relief.   Anticipate discharge home.   Labs c/w need for dialysis which is scheduled for today. The patient reports no relief with topicals that were provided. He is refusing to be discharged despite discussion on chronic nature of this problem and need for outpatient follow up to treat. Dr. Dayna Barker has been in to see him, and he is still refusing discharge. Security asked to escort patient out. He is strongly encouraged to go to his dialysis session today.   Final Clinical Impression(s) / ED Diagnoses Final diagnoses:  None   1. Chronic pruritis   Rx / DC Orders ED Discharge Orders    None       Charlann Lange, PA-C 11/23/19 [id number]    Mesner, Corene Cornea, MD 11/23/19 [phone]

## 2019-11-23 NOTE — ED Triage Notes (Signed)
Pt arrives via [facility] from home.   Pt states he has been itching for "three weeks now", denies being in contact with anything that would make him itch. Pt states "it comes from my dialysis I believe."  MWF dialysis, missed this past Monday.   Pt states "I eat benadryl like candy, but it doesn't do nothing."

## 2019-11-23 NOTE — Discharge Instructions (Addendum)
Your chronic itching is likely due to your hemodialysis. You have been given everything we can treat itching with in the emergency department and prescriptions have been written and sent and you are stable for discharge home. I hope these medications help your condition.   Your labs indicate that you need to go to your dialysis session today. I hope you decide to go even though you have said you won't if your itching continues.   You need to contact your doctor for further management of chronic, uncontrolled itching.

## 2019-11-23 NOTE — ED Notes (Addendum)
Pt stating that he will not leave the ED, he wants dialysis here, he wants to talk to the dr, and "what am I supposed to do when I start itching again". MD and PA informed. Pt encouraged to go to dialysis at the center, pt states "I'm not going all the way up to [place], I want dialysis here". This RN informed the pt that he would not be getting dialysis here and that it is for emergencies and that he has been determined by the doctor to not be suffering from an emergency so he needs to go to his dialysis center.

## 2019-11-23 NOTE — ED Notes (Addendum)
Pt provided with D/C papers, pt attempted to kick this RN's hand. Pt informed that he needs to get dressed and security will be escorting him out.   Medications given in ED highlighted, RN wrote "reapply as needed". For the creams that were given in ED. Pt to leave with the SARNA and KENALOG creams".

## 2019-11-25 DIAGNOSIS — N2581 Secondary hyperparathyroidism of renal origin: Secondary | ICD-10-CM | POA: Diagnosis not present

## 2019-11-25 DIAGNOSIS — D509 Iron deficiency anemia, unspecified: Secondary | ICD-10-CM | POA: Diagnosis not present

## 2019-11-25 DIAGNOSIS — D631 Anemia in chronic kidney disease: Secondary | ICD-10-CM | POA: Diagnosis not present

## 2019-11-25 DIAGNOSIS — Z992 Dependence on renal dialysis: Secondary | ICD-10-CM | POA: Diagnosis not present

## 2019-11-25 DIAGNOSIS — N186 End stage renal disease: Secondary | ICD-10-CM | POA: Diagnosis not present

## 2019-11-25 DIAGNOSIS — R52 Pain, unspecified: Secondary | ICD-10-CM | POA: Diagnosis not present

## 2019-11-25 DIAGNOSIS — L299 Pruritus, unspecified: Secondary | ICD-10-CM | POA: Diagnosis not present

## 2019-11-30 DIAGNOSIS — R52 Pain, unspecified: Secondary | ICD-10-CM | POA: Diagnosis not present

## 2019-11-30 DIAGNOSIS — D509 Iron deficiency anemia, unspecified: Secondary | ICD-10-CM | POA: Diagnosis not present

## 2019-11-30 DIAGNOSIS — D631 Anemia in chronic kidney disease: Secondary | ICD-10-CM | POA: Diagnosis not present

## 2019-11-30 DIAGNOSIS — N186 End stage renal disease: Secondary | ICD-10-CM | POA: Diagnosis not present

## 2019-11-30 DIAGNOSIS — Z992 Dependence on renal dialysis: Secondary | ICD-10-CM | POA: Diagnosis not present

## 2019-11-30 DIAGNOSIS — L299 Pruritus, unspecified: Secondary | ICD-10-CM | POA: Diagnosis not present

## 2019-11-30 DIAGNOSIS — N2581 Secondary hyperparathyroidism of renal origin: Secondary | ICD-10-CM | POA: Diagnosis not present

## 2019-12-02 DIAGNOSIS — L299 Pruritus, unspecified: Secondary | ICD-10-CM | POA: Diagnosis not present

## 2019-12-02 DIAGNOSIS — D509 Iron deficiency anemia, unspecified: Secondary | ICD-10-CM | POA: Diagnosis not present

## 2019-12-02 DIAGNOSIS — Z992 Dependence on renal dialysis: Secondary | ICD-10-CM | POA: Diagnosis not present

## 2019-12-02 DIAGNOSIS — N2581 Secondary hyperparathyroidism of renal origin: Secondary | ICD-10-CM | POA: Diagnosis not present

## 2019-12-02 DIAGNOSIS — R52 Pain, unspecified: Secondary | ICD-10-CM | POA: Diagnosis not present

## 2019-12-02 DIAGNOSIS — D631 Anemia in chronic kidney disease: Secondary | ICD-10-CM | POA: Diagnosis not present

## 2019-12-02 DIAGNOSIS — N186 End stage renal disease: Secondary | ICD-10-CM | POA: Diagnosis not present

## 2019-12-05 DIAGNOSIS — L299 Pruritus, unspecified: Secondary | ICD-10-CM | POA: Diagnosis not present

## 2019-12-05 DIAGNOSIS — R52 Pain, unspecified: Secondary | ICD-10-CM | POA: Diagnosis not present

## 2019-12-05 DIAGNOSIS — D509 Iron deficiency anemia, unspecified: Secondary | ICD-10-CM | POA: Diagnosis not present

## 2019-12-05 DIAGNOSIS — N186 End stage renal disease: Secondary | ICD-10-CM | POA: Diagnosis not present

## 2019-12-05 DIAGNOSIS — Z992 Dependence on renal dialysis: Secondary | ICD-10-CM | POA: Diagnosis not present

## 2019-12-05 DIAGNOSIS — N2581 Secondary hyperparathyroidism of renal origin: Secondary | ICD-10-CM | POA: Diagnosis not present

## 2019-12-05 DIAGNOSIS — D631 Anemia in chronic kidney disease: Secondary | ICD-10-CM | POA: Diagnosis not present

## 2019-12-08 ENCOUNTER — Telehealth: Payer: Self-pay | Admitting: *Deleted

## 2019-12-08 NOTE — Telephone Encounter (Signed)
Called pt at all numbers- one number I left message stating that pt has [facility] scheduled for 3/23 next tues. [facility] 7:30 [facility] , bring a driver to take him home. NPO after midnight before procedure. Sips of water for any meds to take. You will be there total 2-4 hours for the entire stay. Also the f/u appt to see md is on 4/1 at 10:45. Will try again to call later in the day

## 2019-12-09 ENCOUNTER — Emergency Department (HOSPITAL_COMMUNITY)
Admission: EM | Admit: 2019-12-09 | Discharge: 2019-12-09 | Disposition: A | Discharge status: Home / Self Care | Payer: Medicare Other | Attending: Emergency Medicine | Admitting: Emergency Medicine

## 2019-12-09 ENCOUNTER — Emergency Department (HOSPITAL_COMMUNITY): Payer: Medicare Other

## 2019-12-09 ENCOUNTER — Other Ambulatory Visit: Payer: Self-pay

## 2019-12-09 DIAGNOSIS — Z992 Dependence on renal dialysis: Secondary | ICD-10-CM | POA: Diagnosis not present

## 2019-12-09 DIAGNOSIS — L299 Pruritus, unspecified: Secondary | ICD-10-CM | POA: Insufficient documentation

## 2019-12-09 DIAGNOSIS — F17210 Nicotine dependence, cigarettes, uncomplicated: Secondary | ICD-10-CM | POA: Insufficient documentation

## 2019-12-09 DIAGNOSIS — I120 Hypertensive chronic kidney disease with stage 5 chronic kidney disease or end stage renal disease: Secondary | ICD-10-CM | POA: Diagnosis not present

## 2019-12-09 DIAGNOSIS — G4489 Other headache syndrome: Secondary | ICD-10-CM | POA: Diagnosis not present

## 2019-12-09 DIAGNOSIS — Z743 Need for continuous supervision: Secondary | ICD-10-CM | POA: Diagnosis not present

## 2019-12-09 DIAGNOSIS — J810 Acute pulmonary edema: Secondary | ICD-10-CM | POA: Insufficient documentation

## 2019-12-09 DIAGNOSIS — R05 Cough: Secondary | ICD-10-CM | POA: Diagnosis not present

## 2019-12-09 DIAGNOSIS — R0902 Hypoxemia: Secondary | ICD-10-CM | POA: Diagnosis not present

## 2019-12-09 DIAGNOSIS — N186 End stage renal disease: Secondary | ICD-10-CM | POA: Diagnosis not present

## 2019-12-09 DIAGNOSIS — R000 Tachycardia, unspecified: Secondary | ICD-10-CM | POA: Diagnosis not present

## 2019-12-09 DIAGNOSIS — R0602 Shortness of breath: Secondary | ICD-10-CM | POA: Diagnosis not present

## 2019-12-09 LAB — CBC WITH DIFFERENTIAL/PLATELET
Abs Immature Granulocytes: 0 10*3/uL (ref 0.00–0.07)
Basophils Absolute: 0.2 10*3/uL — ABNORMAL HIGH (ref 0.0–0.1)
Basophils Relative: 2 %
Eosinophils Absolute: 0.3 10*3/uL (ref 0.0–0.5)
Eosinophils Relative: 3 %
HCT: 32.6 % — ABNORMAL LOW (ref 39.0–52.0)
Hemoglobin: 9.7 g/dL — ABNORMAL LOW (ref 13.0–17.0)
Lymphocytes Relative: 8 %
Lymphs Abs: 0.7 10*3/uL (ref 0.7–4.0)
MCH: 34.5 pg — ABNORMAL HIGH (ref 26.0–34.0)
MCHC: 29.8 g/dL — ABNORMAL LOW (ref 30.0–36.0)
MCV: 116 fL — ABNORMAL HIGH (ref 80.0–100.0)
Monocytes Absolute: 0.4 10*3/uL (ref 0.1–1.0)
Monocytes Relative: 4 %
Neutro Abs: 7.7 10*3/uL (ref 1.7–7.7)
Neutrophils Relative %: 83 %
Platelets: 140 10*3/uL — ABNORMAL LOW (ref 150–400)
RBC: 2.81 MIL/uL — ABNORMAL LOW (ref 4.22–5.81)
RDW: 17.3 % — ABNORMAL HIGH (ref 11.5–15.5)
WBC: 9.3 10*3/uL (ref 4.0–10.5)
nRBC: 0 % (ref 0.0–0.2)
nRBC: 0 /100 WBC

## 2019-12-09 LAB — BASIC METABOLIC PANEL
Anion gap: 23 — ABNORMAL HIGH (ref 5–15)
BUN: 69 mg/dL — ABNORMAL HIGH (ref 6–20)
CO2: 20 mmol/L — ABNORMAL LOW (ref 22–32)
Calcium: 9.4 mg/dL (ref 8.9–10.3)
Chloride: 98 mmol/L (ref 98–111)
Creatinine, Ser: 15.07 mg/dL — ABNORMAL HIGH (ref 0.61–1.24)
GFR calc Af Amer: 4 mL/min — ABNORMAL LOW (ref >60)
GFR calc non Af Amer: 3 mL/min — ABNORMAL LOW (ref >60)
Glucose, Bld: 79 mg/dL (ref 70–99)
Potassium: 5.1 mmol/L (ref 3.5–5.1)
Sodium: 141 mmol/L (ref 135–145)

## 2019-12-09 IMAGING — DX DG CHEST 1V PORT
1 series · 1 of 1 positions shown · non-contrast
Comparison: Chest radiograph dated [DATE] and CT dated
[DATE]

CLINICAL DATA: 51-year-old male with cough.

EXAM:
PORTABLE CHEST 1 VIEW

[chest]
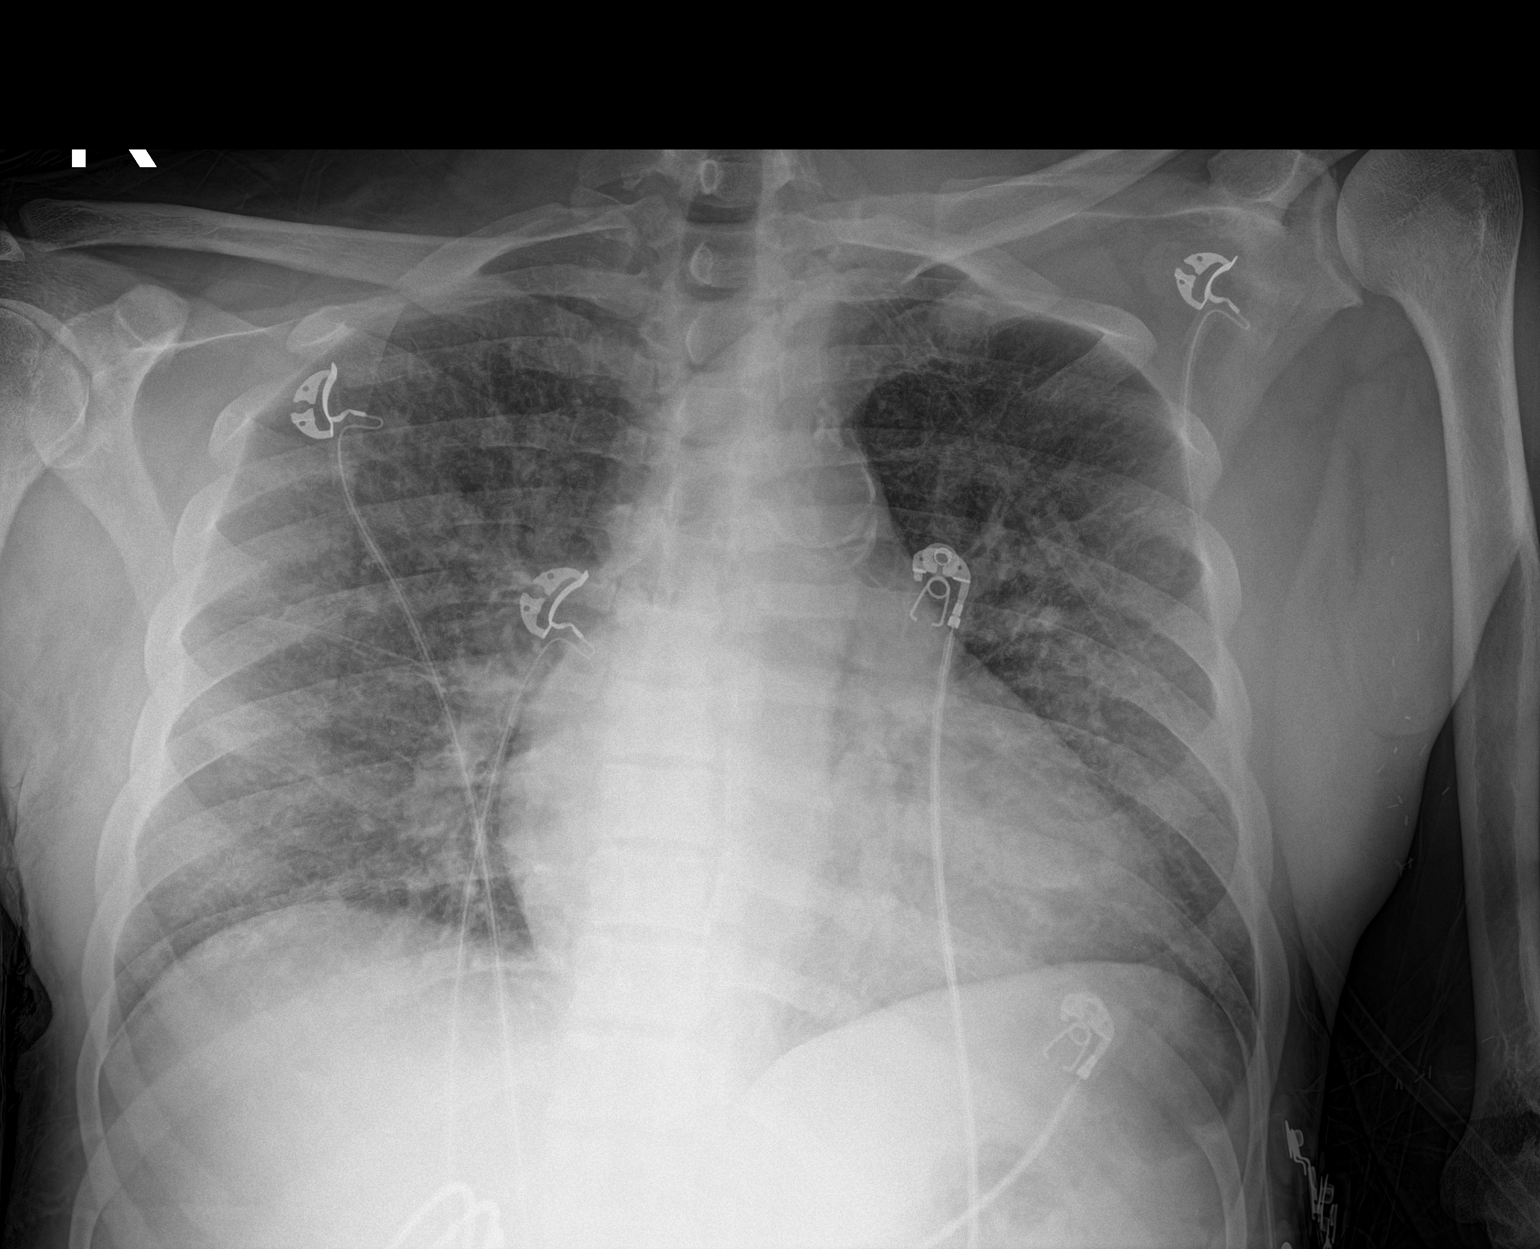

[1 of 1 positions shown; findings below may reference images not displayed]

FINDINGS: There is cardiomegaly with vascular congestion and edema. Pneumonia
is not excluded. Clinical correlation is recommended. No focal
consolidation, pleural effusion, pneumothorax. Atherosclerotic
calcification of the aorta. No acute osseous pathology.
IMPRESSION: Cardiomegaly with pulmonary edema. Pneumonia is not excluded.
Clinical correlation is recommended.

## 2019-12-09 MED ORDER — LIDOCAINE-PRILOCAINE 2.5-2.5 % EX CREA
1.0000 "application " | TOPICAL_CREAM | CUTANEOUS | Status: DC | PRN
  Filled 2019-12-09: qty 5

## 2019-12-09 MED ORDER — SODIUM CHLORIDE 0.9 % IV SOLN: 100.0000 mL | INTRAVENOUS | Status: DC | PRN

## 2019-12-09 MED ORDER — CHLORHEXIDINE GLUCONATE CLOTH 2 % EX PADS: 6.0000 | MEDICATED_PAD | Freq: Every day | CUTANEOUS | Status: DC

## 2019-12-09 MED ORDER — HYDROXYZINE HCL 25 MG PO TABS
25.0000 mg | ORAL_TABLET | Freq: Once | ORAL | Status: AC
  Administered 2019-12-09: 25 mg via ORAL
  Filled 2019-12-09: qty 1

## 2019-12-09 MED ORDER — HEPARIN SODIUM (PORCINE) 1000 UNIT/ML DIALYSIS
1000.0000 [IU] | INTRAMUSCULAR | Status: DC | PRN
  Filled 2019-12-09: qty 1

## 2019-12-09 MED ORDER — LIDOCAINE HCL (PF) 1 % IJ SOLN: 5.0000 mL | INTRAMUSCULAR | Status: DC | PRN

## 2019-12-09 MED ORDER — PENTAFLUOROPROP-TETRAFLUOROETH EX AERO
1.0000 "application " | INHALATION_SPRAY | CUTANEOUS | Status: DC | PRN
  Filled 2019-12-09: qty 30

## 2019-12-09 MED ORDER — ALTEPLASE 2 MG IJ SOLR: 2.0000 mg | Freq: Once | INTRAMUSCULAR | Status: DC | PRN

## 2019-12-09 NOTE — ED Provider Notes (Signed)
Emergency Department Provider Note   I have reviewed the triage vital signs and the nursing notes.   HISTORY  Chief Complaint Shortness of Breath and Pruritis   HPI Jordan [name] is a 54 y.o. male who presents the emerge department today for shortness of breath.  Patient is a dialysis patient last received partial dialysis session on Monday and missed it on Wednesday secondary to court.  He started have worsening dyspnea throughout the night.  Also with his chronic pruritus.  Patient states that this happened to him in the past with fluid overload.  EMS was called he was saturating 88% on room air so put him on oxygen and brought him here for further evaluation.  Patient does not have any chest pain or any other symptoms.   No other associated or modifying symptoms.    Past Medical History:  Diagnosis Date  . Anemia   . Aortic atherosclerosis ([place]) 11/12/2019  . CKD (chronic kidney disease)    Stage 5  Dialysis - M/W/F in [place], [place]  . Dyspnea    tx with inhaler when sick  . ED (erectile dysfunction)   . Emphysema of lung ([place]) 11/12/2019  . ETOH abuse   . History of blood transfusion   . Hypertension   . Wears dentures     Patient Active Problem List   Diagnosis Date Noted  . Fluid overload 11/12/2019  . Acute respiratory failure with hypoxia ([place]) 11/12/2019  . Aortic atherosclerosis ([place]) 11/12/2019  . Emphysema of lung ([place]) 11/12/2019  . Acute pulmonary edema ([place]) 10/31/2019  . Acute on chronic congestive heart failure ([place]) 10/22/2019  . Acute respiratory distress 10/06/2019  . Volume overload 10/05/2019  . Hypokalemia 10/05/2019  . ESRD (end stage renal disease) ([place]) 10/01/2019  . Leg mass, right   . Hyperbilirubinemia   . Prolonged QT interval 09/19/2019  . Symptomatic anemia 09/19/2019  . Pruritus 09/19/2019  . Hemoptysis 02/11/2019  . Hypercalcemia 01/19/2019  . Anemia in ESRD (end-stage renal disease) ([place]) 05/17/2018  . Hepatitis B  03/19/2018  . ESRD on dialysis ([place]) 05/26/2017  . Essential hypertension 05/26/2017  . Urinary tract infection, site not specified 10/17/2016  . Coagulation defect, unspecified ([place]) 08/13/2016  . Diarrhea, unspecified 08/13/2016  . Encounter for immunization 08/13/2016  . Headache, unspecified 08/13/2016  . Hypertensive chronic kidney disease with stage 5 chronic kidney disease or end stage renal disease ([place]) 08/13/2016  . Iron deficiency anemia, unspecified 08/13/2016  . Pain, unspecified 08/13/2016  . Moderate protein-calorie malnutrition ([place]) 08/13/2016  . Secondary hyperparathyroidism of renal origin ([place]) 08/13/2016    Past Surgical History:  Procedure Laterality Date  . BASCILIC VEIN TRANSPOSITION Left 08/07/2016   Procedure: LEFT BASILIC VEIN TRANSPOSITION;  Surgeon: Angelia Mould, MD;  Location: [facility];  Service: Vascular;  Laterality: Left;  . CLOSED REDUCTION NASAL FRACTURE N/A 08/11/2019   Procedure: CLOSED REDUCTION NASAL FRACTURE WITH STABILIZATION;  Surgeon: Irene Limbo, MD;  Location: [facility];  Service: Plastics;  Laterality: N/A;  . COLONOSCOPY N/A 08/12/2016   Procedure: COLONOSCOPY;  Surgeon: Otis Brace, MD;  Location: [facility];  Service: Gastroenterology;  Laterality: N/A;  . ESOPHAGOGASTRODUODENOSCOPY N/A 08/12/2016   Procedure: ESOPHAGOGASTRODUODENOSCOPY (EGD);  Surgeon: Otis Brace, MD;  Location: [facility];  Service: Gastroenterology;  Laterality: N/A;  . INSERTION OF DIALYSIS CATHETER N/A 08/07/2016   Procedure: INSERTION OF TUNNELED DIALYSIS CATHETER;  Surgeon: Angelia Mould, MD;  Location: [facility];  Service: Vascular;  Laterality: N/A;  .  LIGATION OF ARTERIOVENOUS  FISTULA Left 09/12/2016   Procedure: BANDING OF LEFT  ARTERIOVENOUS  FISTULA;  Surgeon: Angelia Mould, MD;  Location: [facility];  Service: Vascular;  Laterality: Left;  . LOWER EXTREMITY INTERVENTION Right 12/02/2018   Procedure: LOWER EXTREMITY INTERVENTION;   Surgeon: Algernon Huxley, MD;  Location: [facility];  Service: Cardiovascular;  Laterality: Right;    Current Outpatient Rx  . Order #: [id number] Class: Historical Med  . Order #: [id number] Class: Normal  . Order #: [id number] Class: Normal  . Order #: [id number] Class: Normal  . Order #: [id number] Class: Normal  . Order #: [id number] Class: Historical Med  . Order #: [id number] Class: Normal  . Order #: [id number] Class: Historical Med  . Order #: [id number] Class: Normal    Allergies Patient has no known allergies.  Family History  Problem Relation Age of Onset  . Diabetes Mother   . Kidney failure Mother   . Healthy Father   . Kidney failure Brother   . Healthy Sister   . Kidney disease Daughter   . Post-traumatic stress disorder Neg Hx   . Bladder Cancer Neg Hx   . Kidney cancer Neg Hx     Social History Social History   Tobacco Use  . Smoking status: Current Every Day Smoker    Packs/day: 0.50    Years: 40.00    Pack years: 20.00    Types: Cigarettes  . Smokeless tobacco: Never Used  Substance Use Topics  . Alcohol use: Yes    Alcohol/week: 21.0 standard drinks    Types: 21 Cans of beer per week    Comment: none since 08/04/16  . Drug use: No    Review of Systems  All other systems negative except as documented in the HPI. All pertinent positives and negatives as reviewed in the HPI. ____________________________________________   PHYSICAL EXAM:  VITAL SIGNS: ED Triage Vitals  Enc Vitals Group     BP 12/09/19 0236 (!) 179/104     Pulse Rate 12/09/19 0236 (!) 113     Resp 12/09/19 0236 (!) 23     Temp 12/09/19 0236 97.7 F (36.5 C)     Temp Source 12/09/19 0236 Oral     SpO2 12/09/19 0236 95 %    Constitutional: Alert and oriented. Well appearing and in no acute distress. Eyes: Conjunctivae are normal. PERRL. EOMI. Head: Atraumatic. Nose: No congestion/rhinnorhea. Mouth/Throat: Mucous membranes are moist.  Oropharynx non-erythematous. Neck: No  stridor.  No meningeal signs.   Cardiovascular: tachycardic rate, regular rhythm. Good peripheral circulation. Grossly normal heart sounds.   Respiratory: tachypneic respiratory effort.  No retractions. Lungs CTAB. Gastrointestinal: Soft and nontender. No distention.  Musculoskeletal: No lower extremity tenderness nor edema. No gross deformities of extremities. Neurologic:  Normal speech and language. No gross focal neurologic deficits are appreciated.  Skin:  Skin is warm, dry and intact. No rash noted.   ____________________________________________   LABS (all labs ordered are listed, but only abnormal results are displayed)  Labs Reviewed  CBC WITH DIFFERENTIAL/PLATELET  BASIC METABOLIC PANEL   ____________________________________________  EKG   EKG Interpretation  Date/Time:    Ventricular Rate:    PR Interval:    QRS Duration:   QT Interval:    QTC Calculation:   R Axis:     Text Interpretation:         ____________________________________________  RADIOLOGY  DG Chest Portable 1 View  Result Date: 12/09/2019 CLINICAL DATA:  54 year old male with cough. EXAM: PORTABLE CHEST 1 VIEW  COMPARISON:  Chest radiograph dated 11/11/2019 and CT dated 11/12/2019 FINDINGS: There is cardiomegaly with vascular congestion and edema. Pneumonia is not excluded. Clinical correlation is recommended. No focal consolidation, pleural effusion, pneumothorax. Atherosclerotic calcification of the aorta. No acute osseous pathology. IMPRESSION: Cardiomegaly with pulmonary edema. Pneumonia is not excluded. Clinical correlation is recommended. Electronically Signed   By: Anner Crete M.D.   On: 12/09/2019 02:54    ____________________________________________   PROCEDURES  Procedure(s) performed:   .Critical Care Performed by: Merrily Pew, MD Authorized by: Merrily Pew, MD   Critical care provider statement:    Critical care time (minutes):  45   Critical care was necessary to  treat or prevent imminent or life-threatening deterioration of the following conditions:  Respiratory failure   Critical care was time spent personally by me on the following activities:  Discussions with consultants, evaluation of patient's response to treatment, examination of patient, ordering and performing treatments and interventions, ordering and review of laboratory studies, ordering and review of radiographic studies, pulse oximetry, re-evaluation of patient's condition, obtaining history from patient or surrogate and review of old charts   ____________________________________________   INITIAL IMPRESSION / ASSESSMENT AND PLAN / ED COURSE  Pulmonary edema with hypoxia. Discussed with Dr. Noel Journey who will arrange for dialysis. Suspect he'll be able to be discharged afterwards.   Still with pruritus. Chronic problem. Can continue to work on it as an outpatient.   Pertinent labs & imaging results that were available during my care of the patient were reviewed by me and considered in my medical decision making (see chart for details).  ____________________________________________  FINAL CLINICAL IMPRESSION(S) / ED DIAGNOSES  Final diagnoses:  None     MEDICATIONS GIVEN DURING THIS VISIT:  Medications - No data to display   NEW OUTPATIENT MEDICATIONS STARTED DURING THIS VISIT:  New Prescriptions   No medications on file    Note:  This note was prepared with assistance of Dragon voice recognition software. Occasional wrong-word or sound-a-like substitutions may have occurred due to the inherent limitations of voice recognition software.   Alexandera Kuntzman, Corene Cornea, MD 12/10/19 [phone]

## 2019-12-09 NOTE — Procedures (Signed)
   I was present at this dialysis session, have reviewed the session itself and made  appropriate changes Kelly Splinter MD [facility] pager [phone]   12/09/2019, 1:53 PM

## 2019-12-09 NOTE — ED Triage Notes (Signed)
Pt arrives via [facility] from home   Pt is a MWF dialysis pt, he missed his Wednesday session.  Pt called EMS for SOB, lungs were clear with ems, Pt was 88% on RA, and 95% On 4L.   Pt took 100 benadryl with no relief for itching.

## 2019-12-09 NOTE — Progress Notes (Signed)
[facility] Brief Note:  Patient presented to ED due to shortness of breath after missing dialysis on 12/07/19.  CXR showed pulmonary edema, weight is ~5L over edw.  Currently on dialysis.  Breathing improved.  No longer requiring O2.  As long as no other reason for admission, and symptoms continue to improve plan to d/c home after HD.   Outpatient HD orders: MWF - [facility] 4hrs  450/800 EDW 68kg 2K 2Ca LU AVF Sensipar 77mcg qHD Venofer 100mg  qHD x 10 mircera 211mcg q2wks - last 3/10 Calcitriol 64mcg qHD  Jen Mow, PA-C [facility] Pager: [phone]

## 2019-12-09 NOTE — ED Provider Notes (Signed)
Care assumed from Dr. Dayna Barker at shift change.  See his note for full H&P.   Plan is for discharge home after dialysis if patient is back to baseline.   12:10 PM I evaluated patient upon his return to emergency department. He is well appearing and in no acute distress. He reports shortness of breath has significantly improved and he has normal work of breathing on my exam. Patient is stable to be discharged home. Recommend he follow up outpatient with pcp to continue work up for pruritis and for symptom recheck. Discussed the importance of attending all dialysis sessions and strict ED return precautions. Patient has no questions and agrees with plan of care. He is stable at time of disposition.   Vitals:   12/09/19 1100 12/09/19 1130 12/09/19 1155 12/09/19 1227  BP: (!) 153/96 137/85 (!) 156/89 (!) 157/91  Pulse: 97 90 87 95  Resp:   18 20  Temp:   98.4 F (36.9 C) 98.8 F (37.1 C)  TempSrc:   Oral Oral  SpO2:   (P) 99% 95%  Weight:   69.3 kg      Portions of this note were generated with Lobby[employer]. Dictation errors may occur despite best attempts at proofreading.      Flint Melter 12/09/19 1230    Veryl Speak, MD 12/09/19 1521

## 2019-12-09 NOTE — Progress Notes (Signed)
Discussed with EDP. Patient with pulmonary edema, on 2 L of oxygen. Not in distress. K acceptable. Plan for HD first shift in the morning. HD ordered and the nurse notified. Full consult to follow.

## 2019-12-09 NOTE — ED Notes (Signed)
Pt. Transported to HD pod #3.

## 2019-12-09 NOTE — ED Notes (Signed)
Pt. Waiting on family to pick him up. ETA 1315 hr.

## 2019-12-09 NOTE — Discharge Instructions (Addendum)
You have been seen today for shortness of breath and itching. Please read and follow all provided instructions. Return to the emergency room for worsening condition or new concerning symptoms.    1. Medications:  Continue usual home medications Take medications as prescribed. Please review all of the medicines and only take them if you do not have an allergy to them.   2. Treatment: rest. Try not to miss scheduled dialysis appointments if possible  3. Follow Up:  Please follow up with primary care provider by scheduling an appointment as soon as possible for a visit to further discuss your itching    It is also a possibility that you have an allergic reaction to any of the medicines that you have been prescribed - Everybody reacts differently to medications and while MOST people have no trouble with most medicines, you may have a reaction such as nausea, vomiting, rash, swelling, shortness of breath. If this is the case, please stop taking the medicine immediately and contact your physician.  ?

## 2019-12-12 ENCOUNTER — Telehealth: Payer: Self-pay

## 2019-12-12 ENCOUNTER — Other Ambulatory Visit: Payer: Self-pay | Admitting: Radiology

## 2019-12-12 DIAGNOSIS — R52 Pain, unspecified: Secondary | ICD-10-CM | POA: Diagnosis not present

## 2019-12-12 DIAGNOSIS — D509 Iron deficiency anemia, unspecified: Secondary | ICD-10-CM | POA: Diagnosis not present

## 2019-12-12 DIAGNOSIS — D631 Anemia in chronic kidney disease: Secondary | ICD-10-CM | POA: Diagnosis not present

## 2019-12-12 DIAGNOSIS — L299 Pruritus, unspecified: Secondary | ICD-10-CM | POA: Diagnosis not present

## 2019-12-12 DIAGNOSIS — Z992 Dependence on renal dialysis: Secondary | ICD-10-CM | POA: Diagnosis not present

## 2019-12-12 DIAGNOSIS — N186 End stage renal disease: Secondary | ICD-10-CM | POA: Diagnosis not present

## 2019-12-12 DIAGNOSIS — N2581 Secondary hyperparathyroidism of renal origin: Secondary | ICD-10-CM | POA: Diagnosis not present

## 2019-12-12 NOTE — Telephone Encounter (Signed)
Called patient and he did not answer. I had to leave him a voicemail on his cell phone since his home phone you can't leave a voicemail. A detailed message was left to go to the [facility] tomorrow at 7:30 AM for his bone marrow biopsy. This patient was contacted ealier today by Venida Jarvis and he did not answer either.

## 2019-12-13 ENCOUNTER — Ambulatory Visit
Admission: RE | Admit: 2019-12-13 | Discharge: 2019-12-13 | Disposition: A | Discharge status: Home / Self Care | Payer: Medicare Other | Source: Ambulatory Visit | Attending: Oncology | Admitting: Oncology

## 2019-12-14 ENCOUNTER — Telehealth: Payer: Self-pay

## 2019-12-14 ENCOUNTER — Telehealth: Payer: Self-pay | Admitting: *Deleted

## 2019-12-14 NOTE — Telephone Encounter (Signed)
Called patient again and had to leave him another voicemail. Patient had a bone marrow biopsy scheduled yesterday and he was a no show. Dr. Janese Banks, since he is not answering, are you okay for me to mail him a letter to call us back. He has a follow up appointment with you to go over his biopsy result. Please advise.

## 2019-12-14 NOTE — Telephone Encounter (Signed)
Jordan [name] called to report that patient was a no show for his bone marrow biopsy today

## 2019-12-15 ENCOUNTER — Telehealth: Payer: Self-pay | Admitting: *Deleted

## 2019-12-15 NOTE — Telephone Encounter (Signed)
Called all numbers for pt.. The first # [phone] no answer and voicemail full. Work # [phone] rings until it disconnects, then Mobile # [phone]- I left a message stating because he did not come for BM bx and we will also cancel his f/u appt on 4/1. I need pt to call me back and left my direct number to call me back so we can r/s.  I also called his brother elmore but there ws no answer and no voicemail has been set up

## 2019-12-16 ENCOUNTER — Telehealth: Payer: Self-pay

## 2019-12-16 DIAGNOSIS — N186 End stage renal disease: Secondary | ICD-10-CM | POA: Diagnosis not present

## 2019-12-16 DIAGNOSIS — L299 Pruritus, unspecified: Secondary | ICD-10-CM | POA: Diagnosis not present

## 2019-12-16 DIAGNOSIS — N2581 Secondary hyperparathyroidism of renal origin: Secondary | ICD-10-CM | POA: Diagnosis not present

## 2019-12-16 DIAGNOSIS — D631 Anemia in chronic kidney disease: Secondary | ICD-10-CM | POA: Diagnosis not present

## 2019-12-16 DIAGNOSIS — Z992 Dependence on renal dialysis: Secondary | ICD-10-CM | POA: Diagnosis not present

## 2019-12-16 DIAGNOSIS — D509 Iron deficiency anemia, unspecified: Secondary | ICD-10-CM | POA: Diagnosis not present

## 2019-12-16 DIAGNOSIS — R52 Pain, unspecified: Secondary | ICD-10-CM | POA: Diagnosis not present

## 2019-12-16 NOTE — Telephone Encounter (Signed)
Error

## 2019-12-19 DIAGNOSIS — D631 Anemia in chronic kidney disease: Secondary | ICD-10-CM | POA: Diagnosis not present

## 2019-12-19 DIAGNOSIS — Z992 Dependence on renal dialysis: Secondary | ICD-10-CM | POA: Diagnosis not present

## 2019-12-19 DIAGNOSIS — L299 Pruritus, unspecified: Secondary | ICD-10-CM | POA: Diagnosis not present

## 2019-12-19 DIAGNOSIS — N2581 Secondary hyperparathyroidism of renal origin: Secondary | ICD-10-CM | POA: Diagnosis not present

## 2019-12-19 DIAGNOSIS — N186 End stage renal disease: Secondary | ICD-10-CM | POA: Diagnosis not present

## 2019-12-19 DIAGNOSIS — R52 Pain, unspecified: Secondary | ICD-10-CM | POA: Diagnosis not present

## 2019-12-19 DIAGNOSIS — D509 Iron deficiency anemia, unspecified: Secondary | ICD-10-CM | POA: Diagnosis not present

## 2019-12-21 DIAGNOSIS — R52 Pain, unspecified: Secondary | ICD-10-CM | POA: Diagnosis not present

## 2019-12-21 DIAGNOSIS — Z992 Dependence on renal dialysis: Secondary | ICD-10-CM | POA: Diagnosis not present

## 2019-12-21 DIAGNOSIS — N186 End stage renal disease: Secondary | ICD-10-CM | POA: Diagnosis not present

## 2019-12-21 DIAGNOSIS — L299 Pruritus, unspecified: Secondary | ICD-10-CM | POA: Diagnosis not present

## 2019-12-21 DIAGNOSIS — N2581 Secondary hyperparathyroidism of renal origin: Secondary | ICD-10-CM | POA: Diagnosis not present

## 2019-12-21 DIAGNOSIS — D509 Iron deficiency anemia, unspecified: Secondary | ICD-10-CM | POA: Diagnosis not present

## 2019-12-21 DIAGNOSIS — D631 Anemia in chronic kidney disease: Secondary | ICD-10-CM | POA: Diagnosis not present

## 2019-12-22 ENCOUNTER — Inpatient Hospital Stay: Payer: Medicare Other | Attending: Oncology | Admitting: Oncology

## 2019-12-22 ENCOUNTER — Telehealth: Payer: Self-pay | Admitting: Oncology

## 2019-12-22 ENCOUNTER — Encounter: Payer: Self-pay | Admitting: Oncology

## 2019-12-22 NOTE — Telephone Encounter (Signed)
Patient did not attend appt on this date. Writer phoned patient to reschedule but could not reach patient and left a voicemail. Letter has also been mailed asking that patient phone [facility] to reschedule.

## 2019-12-23 DIAGNOSIS — D631 Anemia in chronic kidney disease: Secondary | ICD-10-CM | POA: Diagnosis not present

## 2019-12-23 DIAGNOSIS — N186 End stage renal disease: Secondary | ICD-10-CM | POA: Diagnosis not present

## 2019-12-23 DIAGNOSIS — N2581 Secondary hyperparathyroidism of renal origin: Secondary | ICD-10-CM | POA: Diagnosis not present

## 2019-12-23 DIAGNOSIS — L299 Pruritus, unspecified: Secondary | ICD-10-CM | POA: Diagnosis not present

## 2019-12-23 DIAGNOSIS — D509 Iron deficiency anemia, unspecified: Secondary | ICD-10-CM | POA: Diagnosis not present

## 2019-12-23 DIAGNOSIS — Z992 Dependence on renal dialysis: Secondary | ICD-10-CM | POA: Diagnosis not present

## 2019-12-26 DIAGNOSIS — Z992 Dependence on renal dialysis: Secondary | ICD-10-CM | POA: Diagnosis not present

## 2019-12-26 DIAGNOSIS — L299 Pruritus, unspecified: Secondary | ICD-10-CM | POA: Diagnosis not present

## 2019-12-26 DIAGNOSIS — D509 Iron deficiency anemia, unspecified: Secondary | ICD-10-CM | POA: Diagnosis not present

## 2019-12-26 DIAGNOSIS — D631 Anemia in chronic kidney disease: Secondary | ICD-10-CM | POA: Diagnosis not present

## 2019-12-26 DIAGNOSIS — N2581 Secondary hyperparathyroidism of renal origin: Secondary | ICD-10-CM | POA: Diagnosis not present

## 2019-12-26 DIAGNOSIS — N186 End stage renal disease: Secondary | ICD-10-CM | POA: Diagnosis not present

## 2019-12-28 DIAGNOSIS — N186 End stage renal disease: Secondary | ICD-10-CM | POA: Diagnosis not present

## 2019-12-28 DIAGNOSIS — D509 Iron deficiency anemia, unspecified: Secondary | ICD-10-CM | POA: Diagnosis not present

## 2019-12-28 DIAGNOSIS — Z992 Dependence on renal dialysis: Secondary | ICD-10-CM | POA: Diagnosis not present

## 2019-12-28 DIAGNOSIS — D631 Anemia in chronic kidney disease: Secondary | ICD-10-CM | POA: Diagnosis not present

## 2019-12-28 DIAGNOSIS — N2581 Secondary hyperparathyroidism of renal origin: Secondary | ICD-10-CM | POA: Diagnosis not present

## 2019-12-28 DIAGNOSIS — L299 Pruritus, unspecified: Secondary | ICD-10-CM | POA: Diagnosis not present

## 2019-12-30 DIAGNOSIS — Z992 Dependence on renal dialysis: Secondary | ICD-10-CM | POA: Diagnosis not present

## 2019-12-30 DIAGNOSIS — N186 End stage renal disease: Secondary | ICD-10-CM | POA: Diagnosis not present

## 2019-12-30 DIAGNOSIS — L299 Pruritus, unspecified: Secondary | ICD-10-CM | POA: Diagnosis not present

## 2019-12-30 DIAGNOSIS — D631 Anemia in chronic kidney disease: Secondary | ICD-10-CM | POA: Diagnosis not present

## 2019-12-30 DIAGNOSIS — D509 Iron deficiency anemia, unspecified: Secondary | ICD-10-CM | POA: Diagnosis not present

## 2019-12-30 DIAGNOSIS — N2581 Secondary hyperparathyroidism of renal origin: Secondary | ICD-10-CM | POA: Diagnosis not present

## 2020-01-02 DIAGNOSIS — N2581 Secondary hyperparathyroidism of renal origin: Secondary | ICD-10-CM | POA: Diagnosis not present

## 2020-01-02 DIAGNOSIS — D509 Iron deficiency anemia, unspecified: Secondary | ICD-10-CM | POA: Diagnosis not present

## 2020-01-02 DIAGNOSIS — L299 Pruritus, unspecified: Secondary | ICD-10-CM | POA: Diagnosis not present

## 2020-01-02 DIAGNOSIS — Z992 Dependence on renal dialysis: Secondary | ICD-10-CM | POA: Diagnosis not present

## 2020-01-02 DIAGNOSIS — N186 End stage renal disease: Secondary | ICD-10-CM | POA: Diagnosis not present

## 2020-01-02 DIAGNOSIS — D631 Anemia in chronic kidney disease: Secondary | ICD-10-CM | POA: Diagnosis not present

## 2020-01-04 DIAGNOSIS — D509 Iron deficiency anemia, unspecified: Secondary | ICD-10-CM | POA: Diagnosis not present

## 2020-01-04 DIAGNOSIS — N2581 Secondary hyperparathyroidism of renal origin: Secondary | ICD-10-CM | POA: Diagnosis not present

## 2020-01-04 DIAGNOSIS — D631 Anemia in chronic kidney disease: Secondary | ICD-10-CM | POA: Diagnosis not present

## 2020-01-04 DIAGNOSIS — N186 End stage renal disease: Secondary | ICD-10-CM | POA: Diagnosis not present

## 2020-01-04 DIAGNOSIS — L299 Pruritus, unspecified: Secondary | ICD-10-CM | POA: Diagnosis not present

## 2020-01-04 DIAGNOSIS — Z992 Dependence on renal dialysis: Secondary | ICD-10-CM | POA: Diagnosis not present

## 2020-01-06 DIAGNOSIS — N2581 Secondary hyperparathyroidism of renal origin: Secondary | ICD-10-CM | POA: Diagnosis not present

## 2020-01-06 DIAGNOSIS — L299 Pruritus, unspecified: Secondary | ICD-10-CM | POA: Diagnosis not present

## 2020-01-06 DIAGNOSIS — D631 Anemia in chronic kidney disease: Secondary | ICD-10-CM | POA: Diagnosis not present

## 2020-01-06 DIAGNOSIS — Z992 Dependence on renal dialysis: Secondary | ICD-10-CM | POA: Diagnosis not present

## 2020-01-06 DIAGNOSIS — N186 End stage renal disease: Secondary | ICD-10-CM | POA: Diagnosis not present

## 2020-01-06 DIAGNOSIS — D509 Iron deficiency anemia, unspecified: Secondary | ICD-10-CM | POA: Diagnosis not present

## 2020-01-09 ENCOUNTER — Encounter: Payer: Self-pay | Admitting: *Deleted

## 2020-01-09 DIAGNOSIS — Z992 Dependence on renal dialysis: Secondary | ICD-10-CM | POA: Diagnosis not present

## 2020-01-09 DIAGNOSIS — N2581 Secondary hyperparathyroidism of renal origin: Secondary | ICD-10-CM | POA: Diagnosis not present

## 2020-01-09 DIAGNOSIS — N186 End stage renal disease: Secondary | ICD-10-CM | POA: Diagnosis not present

## 2020-01-09 DIAGNOSIS — L299 Pruritus, unspecified: Secondary | ICD-10-CM | POA: Diagnosis not present

## 2020-01-09 DIAGNOSIS — D631 Anemia in chronic kidney disease: Secondary | ICD-10-CM | POA: Diagnosis not present

## 2020-01-09 DIAGNOSIS — D509 Iron deficiency anemia, unspecified: Secondary | ICD-10-CM | POA: Diagnosis not present

## 2020-01-10 ENCOUNTER — Telehealth: Payer: Self-pay | Admitting: *Deleted

## 2020-01-10 NOTE — Telephone Encounter (Signed)
Pt was scheduled for a bone marrow and only thing we could do was call him and left voicemail about the bone marrow and he was told in clinic it would be done and he agreed about it. After he missed the bx. We tried calling multiple times and only 1 phone on his list could leave a voicemail. Then on 4/1 we sent letter to pt. Asking him to call in due to we are unable to contact him by phone except for leaving a message. Today I have sent a certified letter letting him know due that we are unable to connect with him through voicemail, letter we can't schedule any appts. For the future until he contacts our office.

## 2020-01-11 DIAGNOSIS — N186 End stage renal disease: Secondary | ICD-10-CM | POA: Diagnosis not present

## 2020-01-11 DIAGNOSIS — N2581 Secondary hyperparathyroidism of renal origin: Secondary | ICD-10-CM | POA: Diagnosis not present

## 2020-01-11 DIAGNOSIS — D631 Anemia in chronic kidney disease: Secondary | ICD-10-CM | POA: Diagnosis not present

## 2020-01-11 DIAGNOSIS — L299 Pruritus, unspecified: Secondary | ICD-10-CM | POA: Diagnosis not present

## 2020-01-11 DIAGNOSIS — Z992 Dependence on renal dialysis: Secondary | ICD-10-CM | POA: Diagnosis not present

## 2020-01-11 DIAGNOSIS — D509 Iron deficiency anemia, unspecified: Secondary | ICD-10-CM | POA: Diagnosis not present

## 2020-01-16 DIAGNOSIS — D631 Anemia in chronic kidney disease: Secondary | ICD-10-CM | POA: Diagnosis not present

## 2020-01-16 DIAGNOSIS — D509 Iron deficiency anemia, unspecified: Secondary | ICD-10-CM | POA: Diagnosis not present

## 2020-01-16 DIAGNOSIS — N186 End stage renal disease: Secondary | ICD-10-CM | POA: Diagnosis not present

## 2020-01-16 DIAGNOSIS — Z992 Dependence on renal dialysis: Secondary | ICD-10-CM | POA: Diagnosis not present

## 2020-01-16 DIAGNOSIS — N2581 Secondary hyperparathyroidism of renal origin: Secondary | ICD-10-CM | POA: Diagnosis not present

## 2020-01-16 DIAGNOSIS — L299 Pruritus, unspecified: Secondary | ICD-10-CM | POA: Diagnosis not present

## 2020-01-20 DIAGNOSIS — I129 Hypertensive chronic kidney disease with stage 1 through stage 4 chronic kidney disease, or unspecified chronic kidney disease: Secondary | ICD-10-CM | POA: Diagnosis not present

## 2020-01-20 DIAGNOSIS — N186 End stage renal disease: Secondary | ICD-10-CM | POA: Diagnosis not present

## 2020-01-20 DIAGNOSIS — Z992 Dependence on renal dialysis: Secondary | ICD-10-CM | POA: Diagnosis not present

## 2020-01-24 ENCOUNTER — Observation Stay (HOSPITAL_COMMUNITY): Payer: Medicare Other

## 2020-01-24 ENCOUNTER — Encounter (HOSPITAL_COMMUNITY): Payer: Self-pay

## 2020-01-24 ENCOUNTER — Emergency Department (HOSPITAL_COMMUNITY): Payer: Medicare Other

## 2020-01-24 ENCOUNTER — Inpatient Hospital Stay (HOSPITAL_COMMUNITY)
Admission: EM | Admit: 2020-01-24 | Discharge: 2020-01-26 | DRG: 291 | Discharge status: Against Medical Advice | Payer: Medicare Other | Attending: Internal Medicine | Admitting: Internal Medicine

## 2020-01-24 DIAGNOSIS — E8770 Fluid overload, unspecified: Secondary | ICD-10-CM

## 2020-01-24 DIAGNOSIS — J810 Acute pulmonary edema: Secondary | ICD-10-CM | POA: Diagnosis not present

## 2020-01-24 DIAGNOSIS — R0902 Hypoxemia: Secondary | ICD-10-CM | POA: Diagnosis not present

## 2020-01-24 DIAGNOSIS — F1021 Alcohol dependence, in remission: Secondary | ICD-10-CM | POA: Diagnosis present

## 2020-01-24 DIAGNOSIS — Z9115 Patient's noncompliance with renal dialysis: Secondary | ICD-10-CM

## 2020-01-24 DIAGNOSIS — Z79899 Other long term (current) drug therapy: Secondary | ICD-10-CM | POA: Diagnosis not present

## 2020-01-24 DIAGNOSIS — R0683 Snoring: Secondary | ICD-10-CM | POA: Diagnosis not present

## 2020-01-24 DIAGNOSIS — Z20822 Contact with and (suspected) exposure to covid-19: Secondary | ICD-10-CM | POA: Diagnosis not present

## 2020-01-24 DIAGNOSIS — D539 Nutritional anemia, unspecified: Secondary | ICD-10-CM | POA: Diagnosis present

## 2020-01-24 DIAGNOSIS — I5033 Acute on chronic diastolic (congestive) heart failure: Secondary | ICD-10-CM | POA: Diagnosis not present

## 2020-01-24 DIAGNOSIS — Z833 Family history of diabetes mellitus: Secondary | ICD-10-CM

## 2020-01-24 DIAGNOSIS — J439 Emphysema, unspecified: Secondary | ICD-10-CM | POA: Diagnosis not present

## 2020-01-24 DIAGNOSIS — I10 Essential (primary) hypertension: Secondary | ICD-10-CM | POA: Diagnosis present

## 2020-01-24 DIAGNOSIS — D631 Anemia in chronic kidney disease: Secondary | ICD-10-CM | POA: Diagnosis present

## 2020-01-24 DIAGNOSIS — R0602 Shortness of breath: Secondary | ICD-10-CM | POA: Diagnosis not present

## 2020-01-24 DIAGNOSIS — R55 Syncope and collapse: Secondary | ICD-10-CM

## 2020-01-24 DIAGNOSIS — E875 Hyperkalemia: Secondary | ICD-10-CM | POA: Diagnosis not present

## 2020-01-24 DIAGNOSIS — N186 End stage renal disease: Secondary | ICD-10-CM | POA: Diagnosis not present

## 2020-01-24 DIAGNOSIS — Z841 Family history of disorders of kidney and ureter: Secondary | ICD-10-CM

## 2020-01-24 DIAGNOSIS — R609 Edema, unspecified: Secondary | ICD-10-CM | POA: Diagnosis not present

## 2020-01-24 DIAGNOSIS — Z992 Dependence on renal dialysis: Secondary | ICD-10-CM | POA: Diagnosis not present

## 2020-01-24 DIAGNOSIS — I120 Hypertensive chronic kidney disease with stage 5 chronic kidney disease or end stage renal disease: Secondary | ICD-10-CM | POA: Diagnosis not present

## 2020-01-24 DIAGNOSIS — F17210 Nicotine dependence, cigarettes, uncomplicated: Secondary | ICD-10-CM | POA: Diagnosis present

## 2020-01-24 DIAGNOSIS — I132 Hypertensive heart and chronic kidney disease with heart failure and with stage 5 chronic kidney disease, or end stage renal disease: Principal | ICD-10-CM | POA: Diagnosis present

## 2020-01-24 DIAGNOSIS — N2581 Secondary hyperparathyroidism of renal origin: Secondary | ICD-10-CM | POA: Diagnosis present

## 2020-01-24 DIAGNOSIS — Z743 Need for continuous supervision: Secondary | ICD-10-CM | POA: Diagnosis not present

## 2020-01-24 LAB — URINALYSIS, ROUTINE W REFLEX MICROSCOPIC
Bilirubin Urine: NEGATIVE
Glucose, UA: 150 mg/dL — AB
Ketones, ur: NEGATIVE mg/dL
Nitrite: NEGATIVE
Protein, ur: 100 mg/dL — AB
Specific Gravity, Urine: 1.012 (ref 1.005–1.030)
pH: 9 — ABNORMAL HIGH (ref 5.0–8.0)

## 2020-01-24 LAB — CBG MONITORING, ED: Glucose-Capillary: 143 mg/dL — ABNORMAL HIGH (ref 70–99)

## 2020-01-24 LAB — RESPIRATORY PANEL BY RT PCR (FLU A&B, COVID)
Influenza A by PCR: NEGATIVE
Influenza B by PCR: NEGATIVE
SARS Coronavirus 2 by RT PCR: NEGATIVE

## 2020-01-24 LAB — CBC
HCT: 28.7 % — ABNORMAL LOW (ref 39.0–52.0)
Hemoglobin: 8.7 g/dL — ABNORMAL LOW (ref 13.0–17.0)
MCH: 34.1 pg — ABNORMAL HIGH (ref 26.0–34.0)
MCHC: 30.3 g/dL (ref 30.0–36.0)
MCV: 112.5 fL — ABNORMAL HIGH (ref 80.0–100.0)
Platelets: 181 10*3/uL (ref 150–400)
RBC: 2.55 MIL/uL — ABNORMAL LOW (ref 4.22–5.81)
RDW: 16.9 % — ABNORMAL HIGH (ref 11.5–15.5)
WBC: 8.5 10*3/uL (ref 4.0–10.5)
nRBC: 0 % (ref 0.0–0.2)

## 2020-01-24 LAB — BASIC METABOLIC PANEL
Anion gap: 19 — ABNORMAL HIGH (ref 5–15)
BUN: 114 mg/dL — ABNORMAL HIGH (ref 6–20)
CO2: 21 mmol/L — ABNORMAL LOW (ref 22–32)
Calcium: 9.5 mg/dL (ref 8.9–10.3)
Chloride: 101 mmol/L (ref 98–111)
Creatinine, Ser: 20.08 mg/dL — ABNORMAL HIGH (ref 0.61–1.24)
GFR calc Af Amer: 3 mL/min — ABNORMAL LOW (ref >60)
GFR calc non Af Amer: 2 mL/min — ABNORMAL LOW (ref >60)
Glucose, Bld: 100 mg/dL — ABNORMAL HIGH (ref 70–99)
Potassium: 6.3 mmol/L (ref 3.5–5.1)
Sodium: 141 mmol/L (ref 135–145)

## 2020-01-24 LAB — POTASSIUM: Potassium: 3.9 mmol/L (ref 3.5–5.1)

## 2020-01-24 LAB — ECHOCARDIOGRAM COMPLETE
Height: 64 in
Weight: 2512 oz

## 2020-01-24 IMAGING — DX DG CHEST 2V
3 series · 3 of 3 positions shown · non-contrast
Comparison: Chest radiograph dated [DATE].

CLINICAL DATA: 53-year-old male with shortness of breath.

EXAM:
CHEST - 2 VIEW

[chest pa (1 of 2)]
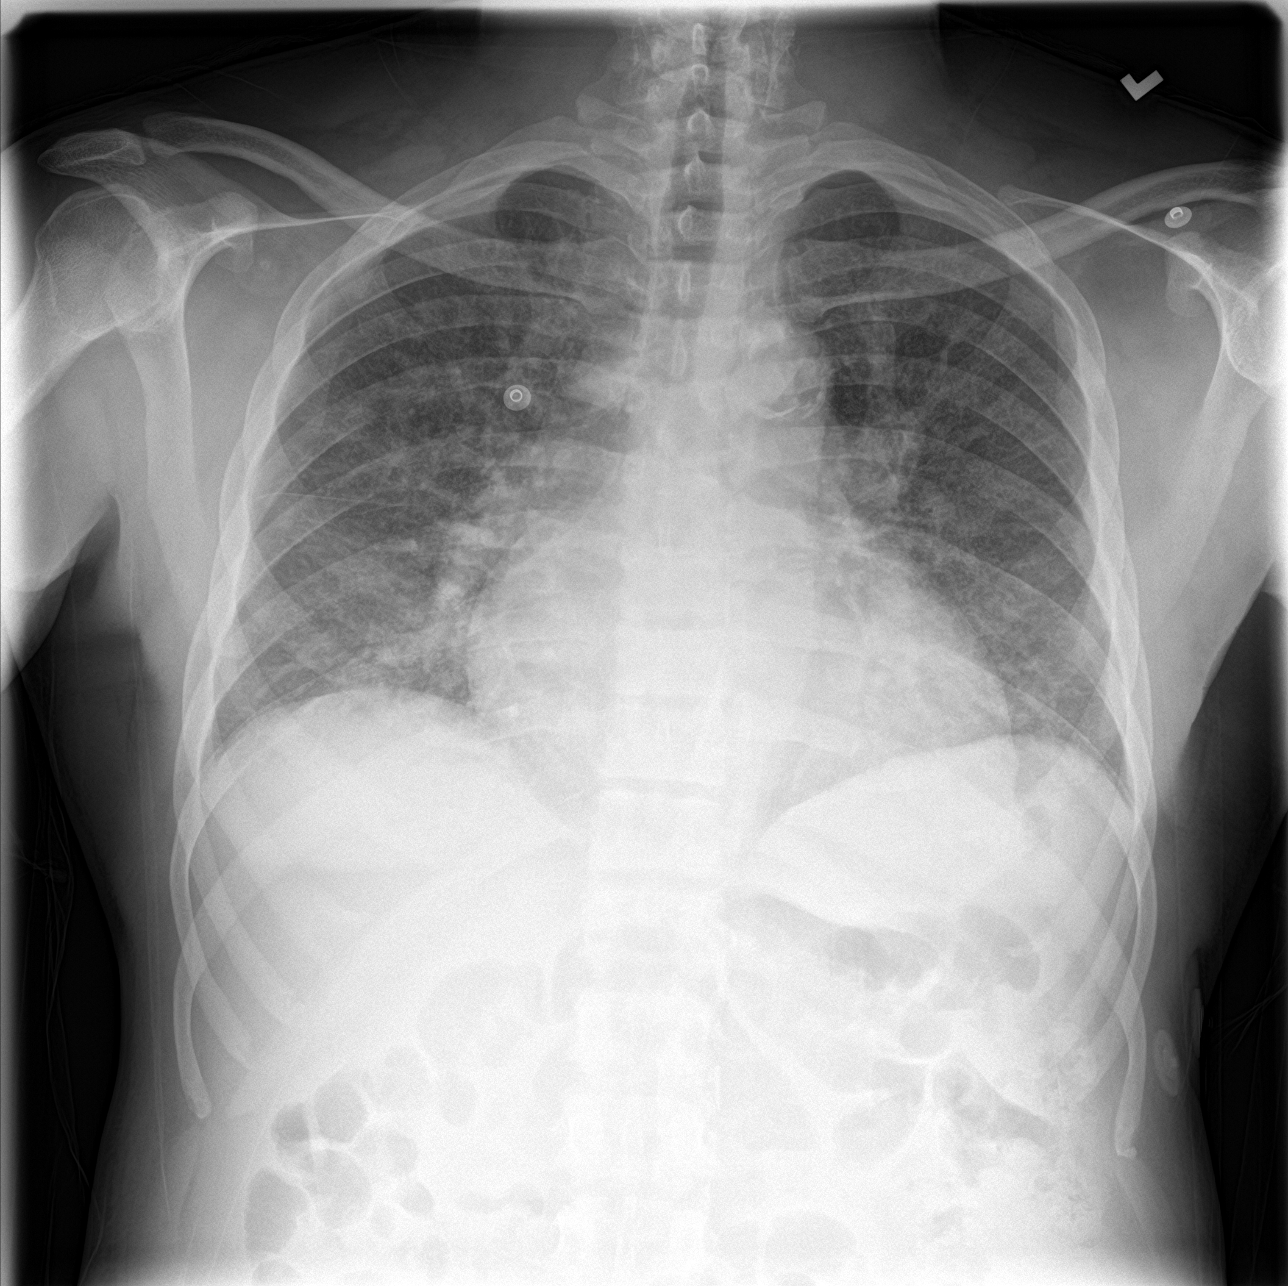

[chest lat]
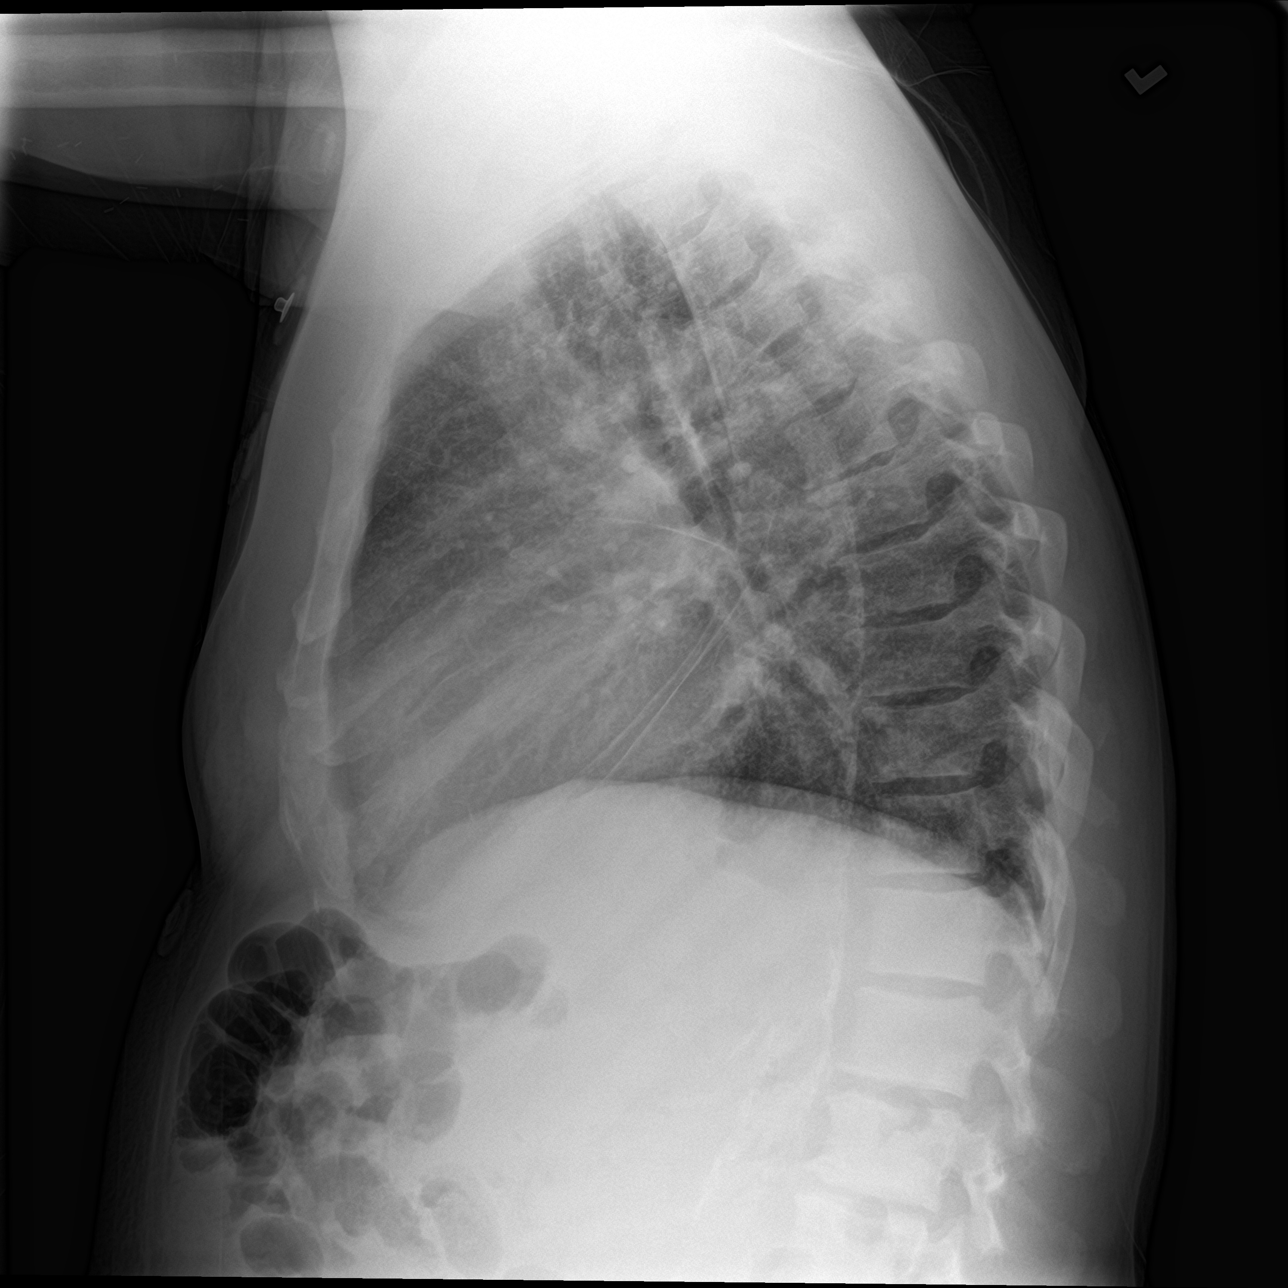

[chest pa (2 of 2)]
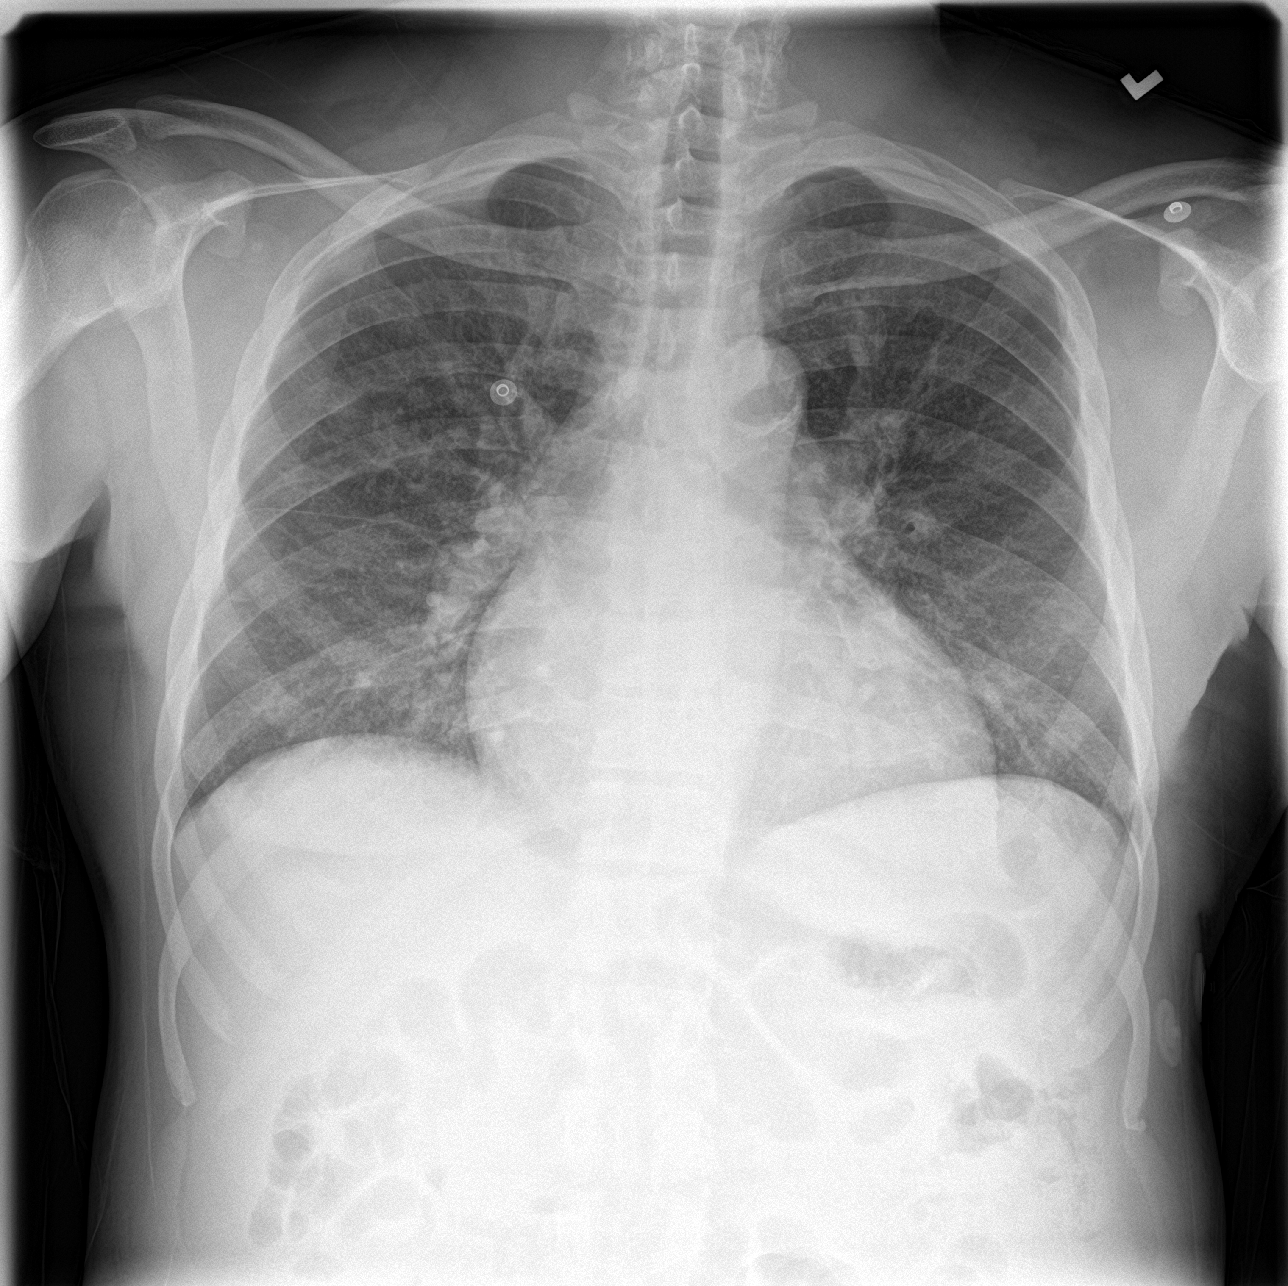

[3 of 3 positions shown; findings below may reference images not displayed]

FINDINGS: There is cardiomegaly with vascular congestion and probable mild
interstitial edema. Pneumonia is not excluded. Clinical correlation
is recommended. No focal consolidation, pleural effusion,
pneumothorax. Atherosclerotic calcification of the aorta. No acute
osseous pathology.
IMPRESSION: Cardiomegaly with vascular congestion and probable mild interstitial
edema. Pneumonia is not excluded.

## 2020-01-24 MED ORDER — ENOXAPARIN SODIUM 30 MG/0.3ML ~~LOC~~ SOLN: 30.0000 mg | SUBCUTANEOUS | Status: DC

## 2020-01-24 MED ORDER — HYDRALAZINE HCL 25 MG PO TABS: 50.0000 mg | ORAL_TABLET | Freq: Three times a day (TID) | ORAL | Status: DC

## 2020-01-24 MED ORDER — SODIUM CHLORIDE 0.9% FLUSH
3.0000 mL | Freq: Two times a day (BID) | INTRAVENOUS | Status: DC
  Administered 2020-01-24 – 2020-01-25 (×4): 3 mL via INTRAVENOUS

## 2020-01-24 MED ORDER — DARBEPOETIN ALFA 200 MCG/0.4ML IJ SOSY
PREFILLED_SYRINGE | INTRAMUSCULAR | Status: AC
  Administered 2020-01-24: 200 ug via SUBCUTANEOUS
  Filled 2020-01-24: qty 0.4

## 2020-01-24 MED ORDER — NITROGLYCERIN 2 % TD OINT
1.0000 [in_us] | TOPICAL_OINTMENT | Freq: Once | TRANSDERMAL | Status: AC
  Administered 2020-01-24: 1 [in_us] via TOPICAL
  Filled 2020-01-24: qty 1

## 2020-01-24 MED ORDER — NICOTINE 14 MG/24HR TD PT24
14.0000 mg | MEDICATED_PATCH | Freq: Every day | TRANSDERMAL | Status: DC
  Administered 2020-01-24 – 2020-01-25 (×2): 14 mg via TRANSDERMAL
  Filled 2020-01-24 (×2): qty 1

## 2020-01-24 MED ORDER — LABETALOL HCL 100 MG PO TABS
100.0000 mg | ORAL_TABLET | Freq: Two times a day (BID) | ORAL | Status: DC
  Administered 2020-01-24 – 2020-01-25 (×4): 100 mg via ORAL
  Filled 2020-01-24 (×4): qty 1

## 2020-01-24 MED ORDER — CHLORHEXIDINE GLUCONATE CLOTH 2 % EX PADS: 6.0000 | MEDICATED_PAD | Freq: Every day | CUTANEOUS | Status: DC

## 2020-01-24 MED ORDER — CALCITRIOL 0.5 MCG PO CAPS
0.5000 ug | ORAL_CAPSULE | ORAL | Status: DC
  Administered 2020-01-25: 0.5 ug via ORAL
  Filled 2020-01-24: qty 1

## 2020-01-24 MED ORDER — ENOXAPARIN SODIUM 30 MG/0.3ML ~~LOC~~ SOLN
30.0000 mg | SUBCUTANEOUS | Status: DC
  Administered 2020-01-24 – 2020-01-25 (×2): 30 mg via SUBCUTANEOUS
  Filled 2020-01-24 (×2): qty 0.3

## 2020-01-24 MED ORDER — CALCIUM ACETATE (PHOS BINDER) 667 MG PO CAPS
1334.0000 mg | ORAL_CAPSULE | Freq: Three times a day (TID) | ORAL | Status: DC
  Administered 2020-01-24 – 2020-01-25 (×3): 1334 mg via ORAL
  Filled 2020-01-24 (×6): qty 2

## 2020-01-24 MED ORDER — HYDROXYZINE HCL 25 MG PO TABS
25.0000 mg | ORAL_TABLET | Freq: Once | ORAL | Status: AC
  Administered 2020-01-24: 25 mg via ORAL

## 2020-01-24 MED ORDER — ACETAMINOPHEN 325 MG PO TABS: 650.0000 mg | ORAL_TABLET | Freq: Four times a day (QID) | ORAL | Status: DC | PRN

## 2020-01-24 MED ORDER — HYDROXYZINE HCL 25 MG PO TABS
ORAL_TABLET | ORAL | Status: AC
  Filled 2020-01-24: qty 1

## 2020-01-24 MED ORDER — DARBEPOETIN ALFA 200 MCG/0.4ML IJ SOSY
200.0000 ug | PREFILLED_SYRINGE | INTRAMUSCULAR | Status: DC
  Filled 2020-01-24: qty 0.4

## 2020-01-24 MED ORDER — HYDROXYZINE HCL 25 MG PO TABS
25.0000 mg | ORAL_TABLET | Freq: Three times a day (TID) | ORAL | Status: DC | PRN
  Administered 2020-01-24: 25 mg via ORAL
  Filled 2020-01-24: qty 1

## 2020-01-24 MED ORDER — SODIUM CHLORIDE 0.9% FLUSH: 3.0000 mL | Freq: Once | INTRAVENOUS | Status: DC

## 2020-01-24 MED ORDER — AMLODIPINE BESYLATE 10 MG PO TABS
10.0000 mg | ORAL_TABLET | Freq: Every day | ORAL | Status: DC
  Administered 2020-01-24 – 2020-01-25 (×2): 10 mg via ORAL
  Filled 2020-01-24: qty 1
  Filled 2020-01-24: qty 2
  Filled 2020-01-24: qty 1

## 2020-01-24 MED ORDER — ACETAMINOPHEN 650 MG RE SUPP: 650.0000 mg | Freq: Four times a day (QID) | RECTAL | Status: DC | PRN

## 2020-01-24 MED ORDER — CHLORHEXIDINE GLUCONATE CLOTH 2 % EX PADS
6.0000 | MEDICATED_PAD | Freq: Every day | CUTANEOUS | Status: DC
  Administered 2020-01-25 – 2020-01-26 (×2): 6 via TOPICAL

## 2020-01-24 MED ORDER — PROCHLORPERAZINE EDISYLATE 10 MG/2ML IJ SOLN: 5.0000 mg | INTRAMUSCULAR | Status: DC | PRN

## 2020-01-24 NOTE — ED Notes (Signed)
Breakfast Ordered 

## 2020-01-24 NOTE — H&P (Signed)
History and Physical    Jordan [name] BHA:[id number] DOB: Jun 22, 1966 DOA: 01/24/2020  PCP: Jearld Fenton, NP   Patient coming from: Home.  I have personally briefly reviewed patient's old medical records in [place]  Chief Complaint: Legs and testicles swelling.  HPI: Jordan [name] is a 54 y.o. male with medical history significant of anemia, aortic stenosis, stage V CKD, ED, emphysema lung, active smoker, history of EtOH abuse, history of blood transfusion, hypertension who is coming to the emergency department due to bilateral lower extremity and bilateral testicular edema since yesterday.  He has been mildly dyspneic.  He denies sodium or high fluid intake.  He reports missing dialysis on Friday and Monday.  He also stated that he passed out for 5 times while trying to have a bowel movement at home.  He denies chest pain, palpitations, nausea, emesis, diaphoresis, fever, chills, sore throat, wheezing or hemoptysis.  He has been constipated, denies abdominal pain, diarrhea, melena or hematochezia.  He rarely urinates.  No dysuria, frequency or nocturia.  ED Course: Initial vital signs temperature 98.1 F, pulse 101, respiration 18, blood pressure 166/101 mmHg and O2 sat 86% on room air.  Patient received supplemental oxygen and nephrology was contacted.  Nephrology did not suggest any treatment for hyperkalemia, but they are arranging for HD in the next 2 hours.  CBC shows a white count of 8.5, hemoglobin 8.7 g/dL and platelets 181.  Sodium 141, potassium 6.3, chloride 101 and CO2 21 mmol/L.  Glucose 100, BUN 114, creatinine 20.08 and calcium 9.5 mg/dL.  His chest radiograph showed cardiomegaly with vascular congestion and probably mild interstitial edema.   Review of Systems: As per HPI otherwise all other systems reviewed and are negative.  Past Medical History:  Diagnosis Date  . Anemia   . Aortic atherosclerosis ([place]) 11/12/2019  . CKD (chronic kidney disease)    Stage 5   Dialysis - M/W/F in [place], [place]  . Dyspnea    tx with inhaler when sick  . ED (erectile dysfunction)   . Emphysema of lung ([place]) 11/12/2019  . ETOH abuse   . History of blood transfusion   . Hypertension   . Wears dentures     Past Surgical History:  Procedure Laterality Date  . BASCILIC VEIN TRANSPOSITION Left 08/07/2016   Procedure: LEFT BASILIC VEIN TRANSPOSITION;  Surgeon: Angelia Mould, MD;  Location: [facility];  Service: Vascular;  Laterality: Left;  . CLOSED REDUCTION NASAL FRACTURE N/A 08/11/2019   Procedure: CLOSED REDUCTION NASAL FRACTURE WITH STABILIZATION;  Surgeon: Irene Limbo, MD;  Location: [facility];  Service: Plastics;  Laterality: N/A;  . COLONOSCOPY N/A 08/12/2016   Procedure: COLONOSCOPY;  Surgeon: Otis Brace, MD;  Location: [facility];  Service: Gastroenterology;  Laterality: N/A;  . ESOPHAGOGASTRODUODENOSCOPY N/A 08/12/2016   Procedure: ESOPHAGOGASTRODUODENOSCOPY (EGD);  Surgeon: Otis Brace, MD;  Location: [facility];  Service: Gastroenterology;  Laterality: N/A;  . INSERTION OF DIALYSIS CATHETER N/A 08/07/2016   Procedure: INSERTION OF TUNNELED DIALYSIS CATHETER;  Surgeon: Angelia Mould, MD;  Location: [facility];  Service: Vascular;  Laterality: N/A;  . LIGATION OF ARTERIOVENOUS  FISTULA Left 09/12/2016   Procedure: BANDING OF LEFT  ARTERIOVENOUS  FISTULA;  Surgeon: Angelia Mould, MD;  Location: [facility];  Service: Vascular;  Laterality: Left;  . LOWER EXTREMITY INTERVENTION Right 12/02/2018   Procedure: LOWER EXTREMITY INTERVENTION;  Surgeon: Algernon Huxley, MD;  Location: [facility];  Service: Cardiovascular;  Laterality: Right;  Social History  reports that he has been smoking cigarettes. He has a 20.00 pack-year smoking history. He has never used smokeless tobacco. He reports current alcohol use of about 21.0 standard drinks of alcohol per week. He reports that he does not use drugs.  No Known Allergies  Family  History  Problem Relation Age of Onset  . Diabetes Mother   . Kidney failure Mother   . Healthy Father   . Kidney failure Brother   . Healthy Sister   . Kidney disease Daughter   . Post-traumatic stress disorder Neg Hx   . Bladder Cancer Neg Hx   . Kidney cancer Neg Hx    Prior to Admission medications   Medication Sig Start Date End Date Taking? Authorizing Provider  acetaminophen (TYLENOL) 500 MG tablet Take 1,000 mg by mouth every 6 (six) hours as needed for mild pain or fever.  03/09/19 03/07/20 Yes [provider]  albuterol (PROVENTIL HFA;VENTOLIN HFA) 108 (90 Base) MCG/ACT inhaler Inhale 2 puffs into the lungs every 4 (four) hours as needed for wheezing or shortness of breath. 10/03/18  Yes Orpah Greek, MD  calcitRIOL (ROCALTROL) 0.5 MCG capsule Take 1 capsule (0.5 mcg total) by mouth every Monday, Wednesday, and Friday with hemodialysis. 10/24/19  Yes Pokhrel, Laxman, MD  calcium acetate (PHOSLO) 667 MG capsule Take 2 capsules (1,334 mg total) by mouth 3 (three) times daily with meals. 02/12/19  Yes Fritzi Mandes, MD  camphor-menthol [facility]) lotion Apply 1 application topically as needed for itching. 11/23/19  Yes Upstill, Nehemiah Settle, PA-C  hydrOXYzine (ATARAX/VISTARIL) 50 MG tablet Take 50 mg by mouth 3 (three) times daily. 08/09/19  Yes [provider]  sildenafil (VIAGRA) 50 MG tablet Take 50 mg by mouth daily as needed for erectile dysfunction. 07/20/19  Yes [provider]  triamcinolone cream (KENALOG) 0.1 % Apply 1 application topically 2 (two) times daily. 11/23/19  Yes Charlann Lange, PA-C    Physical Exam: Vitals:   01/24/20 0131 01/24/20 0337 01/24/20 0338 01/24/20 0400  BP: (!) 166/101 (!) 163/107 (!) 163/107 (!) 158/109  Pulse: (!) 101 99 96 99  Resp: 18 (!) 21 20 (!) 22  Temp: 98.1 F (36.7 C)  98.4 F (36.9 C)   TempSrc:   Oral   SpO2: (!) 86% 92% 98% 96%  Weight:   71.2 kg   Height:   5\' 4"  (1.626 m)     Constitutional: NAD, calm,  comfortable Eyes: PERRL, lids and conjunctivae normal ENMT: Mucous membranes are moist. Posterior pharynx clear of any exudate or lesions. Neck: normal, supple, no masses, no thyromegaly Respiratory: Mildly tachypneic in the low 20s, decreased breath sounds with scattered crackles bilaterally, no wheezing. No accessory muscle use.  Cardiovascular: Regular rate and rhythm, no murmurs / rubs / gallops. Left arm AV fistula in place with good thrill.  No extremity edema. 2+ pedal pulses. No carotid bruits.  Abdomen: no tenderness, no masses palpated. No hepatosplenomegaly. Bowel sounds positive.  GU: Positive scrotal and penile edema. Musculoskeletal: no clubbing / cyanosis. Good ROM, no contractures. Normal muscle tone.  Skin: no rashes, lesions, ulcers on very limited dermatological examination. Neurologic: CN 2-12 grossly intact. Sensation intact, DTR normal. Strength 5/5 in all 4.  Psychiatric: Normal judgment and insight. Alert and oriented x 3. Normal mood.   Labs on Admission: I have personally reviewed following labs and imaging studies  CBC: Recent Labs  Lab 01/24/20 0147  WBC 8.5  HGB 8.7*  HCT 28.7*  MCV  112.5*  PLT 010    Basic Metabolic Panel: Recent Labs  Lab 01/24/20 0147  NA 141  K 6.3*  CL 101  CO2 21*  GLUCOSE 100*  BUN 114*  CREATININE 20.08*  CALCIUM 9.5    GFR: Estimated Creatinine Clearance: 3.9 mL/min (A) (by C-G formula based on SCr of 20.08 mg/dL (H)).  Liver Function Tests: No results for input(s): AST, ALT, ALKPHOS, BILITOT, PROT, ALBUMIN in the last 168 hours.  Radiological Exams on Admission: DG Chest 2 View  Result Date: 01/24/2020 CLINICAL DATA:  54 year old male with shortness of breath. EXAM: CHEST - 2 VIEW COMPARISON:  Chest radiograph dated 12/09/2019. FINDINGS: There is cardiomegaly with vascular congestion and probable mild interstitial edema. Pneumonia is not excluded. Clinical correlation is recommended. No focal consolidation,  pleural effusion, pneumothorax. Atherosclerotic calcification of the aorta. No acute osseous pathology. IMPRESSION: Cardiomegaly with vascular congestion and probable mild interstitial edema. Pneumonia is not excluded. Electronically Signed   By: Anner Crete M.D.   On: 01/24/2020 01:59    EKG: Independently reviewed. Vent. rate 100 BPM PR interval 134 ms QRS duration 78 ms QT/QTc 356/459 ms P-R-T axes 63 76 35 NSR Normal ECG.  Assessment/Plan Principal Problem:   Syncopal episodes Likely vasovagal. Observation/telemetry. Check echocardiogram. Check carotid Doppler.  Active Problems:   Fluid overload Continue supplemental oxygen. Nephrology currently arranging for dialysis. Nitropaste to [facility] in the meantime. Avoid missing HD sessions in the future.    ESRD on dialysis [facility]) Going for HD soon. Advised to keep HD as scheduled. Continue calcium acetate. Avoid sodium intake. Avoid high liquids intake.    Essential hypertension Currently not on antihypertensives. Monitor blood pressure. Use antihypertensive as needed.    Macrocytic anemia Monitor H&H. Normal folate and B12 three months ago. Erythropoietin per nephrology.    Hyperkalemia Will have dialysis soon. No acute treatment per nephrology.   DVT prophylaxis: Lovenox SQ. Code Status:   Full code. Family Communication: Disposition Plan:   Patient is from:  Home.  Anticipated DC to:  Home.  Anticipated DC date:  01/24/2020 or 01/25/2020.  Anticipated DC barriers: Work-up and nephrology clearance. Consults called:  Nephrology (Dr. Justin Mend). Admission status: Observation/telemetry.  Severity of Illness: High severity.  Reubin Milan MD [facility]  How to contact the [facility] Attending or Consulting provider [place] or covering provider during after hours [place], for this patient?   1. Check the care team in [facility] and look for a) attending/consulting [facility] provider listed and b) the [facility] team  listed 2. Log into [email or web address] and use [facility]'s universal password to access. If you do not have the password, please contact the hospital operator. 3. Locate the [facility] provider you are looking for under [facility] and page to a number that you can be directly reached. 4. If you still have difficulty reaching the provider, please page the [facility] (Director on Call) for the Hospitalists listed on amion for assistance.  01/24/2020, 4:38 AM   This document was prepared using Dragon voice recognition software and may contain some unintended transcription errors.

## 2020-01-24 NOTE — Progress Notes (Signed)
PROGRESS NOTE    Patient: Jordan [name]                            PCP: Jearld Fenton, NP                    DOB: [birth year]            DOA: 01/24/2020 NOM:[id number]             DOS: 01/24/2020, 8:51 AM   LOS: 0 days   Date of Service: The patient was seen and examined on 01/24/2020  Subjective:   The patient was seen and examined this morning, remained stable sitting at the side of bed having breakfast. Stating he does not know why he is missed his dialysis, reporting he does not take his medications.  Denying of having any chest pain, shortness of breath.  Brief Narrative:   Jordan [name] is a 54 y.o.M with PMh/o chronic anemia, aortic stenosis, ESRD, ED, Emphysema lung, active smoker, history of EtOH abuse, h/o blood transfusion, HTN .Marland Kitchen Presented to the ED with missed hemodialysis, BL lower extremity edema and bilateral testicular edema since yesterday. He reports missing dialysis on Friday and Monday.  He also stated that he passed out for 5 times while trying to have a bowel movement at home. In ED work-up revealed potassium 6.3, creatinine of 20.08,  Nephrologist on-call was consulted and are recommending immediate treatment for hyperkalemia, plan to hemodialysis earlier this morning.   Assessment & Plan:   Principal Problem:   Syncopal episodes Active Problems:   ESRD on dialysis [facility])   Essential hypertension   Fluid overload   Macrocytic anemia   Hyperkalemia   Syncopal episodes -Likely vasovagal, electrolyte normality due to missed hemodialysis -We will continue observation/telemetry. -Check echocardiogram. -Check carotid Doppler.  Active Problems:    End-stage renal disease/hyperkalemia/volume overload -On hemodialysis -missed 2 session of hemodialysis (Friday and Monday) -Nephrologist consulted emergent hemodialysis -Monitoring potassium, hopefully will improve with dialysis -Continue calcium acetate -Monitoring lower extremity and testicular  edema -Anticipated fluid reduction with hemodialysis -Monitoring daily weight   Essential hypertension Currently not on antihypertensives. Monitor blood pressure. -We will initiate medication adding Norvasc, labetalol -As needed IV hydralazine    Anemia of chronic disease-macrocytic anemia Monitor H&H. Normal folate and B12 three months ago. Erythropoietin per nephrology.    H/o Aortic stenosis -Obtaining echocardiogram, -Monitoring -Adding medication for optimizing blood pressure -We will add beta-blockers.  Emphysema lung, active smoker -Patient was advised to quit smoking he shows no interest -NicoDerm patch will be provided -We will continue monitor O2 sat,  provide supplemental oxygen, maintaining O2 sat greater 92% -Nebs as needed.  History of EtOH abuse,  -Denies any recent use of alcohol -We will monitor closely.    Nutritional status:         Cultures; None   Antimicrobials: None     Consultants: Nephrologist   -----------------------------------------------------------------------------------------------------------------------------  DVT prophylaxis: SCD/Compression stockings and Lovenox SQ Code Status:   Code Status: Full Code Family Communication: No family member present at bedside-  The above findings and plan of care has been discussed with patient  in detail,  they expressed understanding and agreement of above.   Admission status:   Status is: Observation  The patient remains OBS appropriate and will d/c before 2 midnights.  Dispo: The patient is from: Home  Anticipated d/c is to: Home              Anticipated d/c date is: 1 day              Patient currently is not medically stable to d/c.  Due to critically elevated potassium,  volume overload, needing emergent hemodialysis,          Procedures:    Hemodialysis  Antimicrobials:  Anti-infectives (From admission, onward)   None       Medication:   . [START ON 01/25/2020] calcitRIOL  0.5 mcg Oral Q M,W,F-HD  . calcium acetate  1,334 mg Oral TID WC  . enoxaparin (LOVENOX) injection  30 mg Subcutaneous Q24H  . nicotine  14 mg Transdermal Daily  . sodium chloride flush  3 mL Intravenous Once  . sodium chloride flush  3 mL Intravenous Q12H    acetaminophen **OR** acetaminophen, prochlorperazine   Objective:   Vitals:   01/24/20 0600 01/24/20 0615 01/24/20 0616 01/24/20 0718  BP: (!) 152/97   (!) 173/112  Pulse: 95 (!) 109 (!) 109 95  Resp: (!) 21   19  Temp:      TempSrc:      SpO2:  92% 94% 94%  Weight:      Height:        Intake/Output Summary (Last 24 hours) at 01/24/2020 0851 Last data filed at 01/24/2020 3664 Gross per 24 hour  Intake 120 ml  Output --  Net 120 ml   Filed Weights   01/24/20 0338  Weight: 71.2 kg     Examination:   Physical Exam  Constitution:  Alert, cooperative, no distress,  Appears calm and comfortable  Psychiatric:  stable mood and affect, cognition intact,   HEENT: Normocephalic, PERRL, otherwise with in Normal limits  Chest:Chest symmetric Cardio vascular:  S1/S2, RRR, No murmure, No Rubs or Gallops  pulmonary: Clear to auscultation bilaterally, respirations unlabored, negative wheezes / crackles Abdomen: Soft, non-tender, non-distended, bowel sounds,no masses, no organomegaly Muscular skeletal: Limited exam - in bed, able to move all 4 extremities, Normal strength,  Neuro: CNII-XII intact. , normal motor and sensation, reflexes intact  Extremities: +2 pitting edema lower extremities, +2 pulses  Skin: Dry, warm to touch, negative for any Rashes, No open wounds Wounds: per nursing documentation    ------------------------------------------------------------------------------------------------------------------------------------------    LABs:  CBC Latest Ref Rng & Units 01/24/2020 12/09/2019 11/23/2019  WBC 4.0 - 10.5 K/uL 8.5 9.3 9.2  Hemoglobin 13.0 - 17.0 g/dL 8.7(L) 9.7(L) 8.0(L)   Hematocrit 39.0 - 52.0 % 28.7(L) 32.6(L) 25.8(L)  Platelets 150 - 400 K/uL 181 140(L) 173   CMP Latest Ref Rng & Units 01/24/2020 12/09/2019 11/23/2019  Glucose 70 - 99 mg/dL 100(H) 79 97  BUN 6 - 20 mg/dL 114(H) 69(H) 90(H)  Creatinine 0.61 - 1.24 mg/dL 20.08(H) 15.07(H) 19.65(H)  Sodium 135 - 145 mmol/L 141 141 138  Potassium 3.5 - 5.1 mmol/L 6.3(HH) 5.1 4.6  Chloride 98 - 111 mmol/L 101 98 94(L)  CO2 22 - 32 mmol/L 21(L) 20(L) 20(L)  Calcium 8.9 - 10.3 mg/dL 9.5 9.4 9.2  Total Protein 6.5 - 8.1 g/dL - - 6.5  Total Bilirubin 0.3 - 1.2 mg/dL - - 1.2  Alkaline Phos 38 - 126 U/L - - 153(H)  AST 15 - 41 U/L - - 17  ALT 0 - 44 U/L - - 13       Micro Results Recent Results (from the past 240 hour(s))  Respiratory Panel  by RT PCR (Flu A&B, Covid) - Nasopharyngeal Swab     Status: None   Collection Time: 01/24/20  3:52 AM   Specimen: Nasopharyngeal Swab  Result Value Ref Range Status   SARS Coronavirus 2 by RT PCR NEGATIVE NEGATIVE Final    Comment: (NOTE) SARS-CoV-2 target nucleic acids are NOT DETECTED. The SARS-CoV-2 RNA is generally detectable in upper respiratoy specimens during the acute phase of infection. The lowest concentration of SARS-CoV-2 viral copies this assay can detect is 131 copies/mL. A negative result does not preclude SARS-Cov-2 infection and should not be used as the sole basis for treatment or other patient management decisions. A negative result may occur with  improper specimen collection/handling, submission of specimen other than nasopharyngeal swab, presence of viral mutation(s) within the areas targeted by this assay, and inadequate number of viral copies (<131 copies/mL). A negative result must be combined with clinical observations, patient history, and epidemiological information. The expected result is Negative. Fact Sheet for Patients:  PinkCheek.be Fact Sheet for Healthcare Providers:   GravelBags.it This test is not yet ap proved or cleared by the [place] FDA and  has been authorized for detection and/or diagnosis of SARS-CoV-2 by FDA under an Emergency Use Authorization (EUA). This EUA will remain  in effect (meaning this test can be used) for the duration of the COVID-19 declaration under Section 564(b)(1) of the Act, 21 U.S.C. section 360bbb-3(b)(1), unless the authorization is terminated or revoked sooner.    Influenza A by PCR NEGATIVE NEGATIVE Final   Influenza B by PCR NEGATIVE NEGATIVE Final    Comment: (NOTE) The Xpert Xpress SARS-CoV-2/FLU/RSV assay is intended as an aid in  the diagnosis of influenza from Nasopharyngeal swab specimens and  should not be used as a sole basis for treatment. Nasal washings and  aspirates are unacceptable for Xpert Xpress SARS-CoV-2/FLU/RSV  testing. Fact Sheet for Patients: PinkCheek.be Fact Sheet for Healthcare Providers: GravelBags.it This test is not yet approved or cleared by the [place] FDA and  has been authorized for detection and/or diagnosis of SARS-CoV-2 by  FDA under an Emergency Use Authorization (EUA). This EUA will remain  in effect (meaning this test can be used) for the duration of the  Covid-19 declaration under Section 564(b)(1) of the Act, 21  U.S.C. section 360bbb-3(b)(1), unless the authorization is  terminated or revoked. Performed at [facility], [place] [address], [place], [place] [address]     Radiology Reports DG Chest 2 View  Result Date: 01/24/2020 CLINICAL DATA:  54 year old male with shortness of breath. EXAM: CHEST - 2 VIEW COMPARISON:  Chest radiograph dated 12/09/2019. FINDINGS: There is cardiomegaly with vascular congestion and probable mild interstitial edema. Pneumonia is not excluded. Clinical correlation is recommended. No focal consolidation, pleural effusion, pneumothorax.  Atherosclerotic calcification of the aorta. No acute osseous pathology. IMPRESSION: Cardiomegaly with vascular congestion and probable mild interstitial edema. Pneumonia is not excluded. Electronically Signed   By: Anner Crete M.D.   On: 01/24/2020 01:59    SIGNED: Deatra James, MD, FACP, FHM. [facility],  Pager (please use [email or web address] to page/text)  If 7PM-7AM, please contact night-coverage [email or web address] 01/24/2020, 8:51 AM

## 2020-01-24 NOTE — ED Notes (Signed)
Gave patient urinal for u/a

## 2020-01-24 NOTE — ED Notes (Signed)
Pt given 221ml ginger ale per pt request.

## 2020-01-24 NOTE — ED Notes (Signed)
2L O2 applied to patient per PA

## 2020-01-24 NOTE — Consult Note (Signed)
Renal Service Consult Note [facility]  Jordan [name] 01/24/2020 Sol Blazing Requesting Physician:  Dr Roger Shelter, Chauncey Cruel.  Reason for Consult:  ESRD pt w/ swelling in legs and scrotum HPI: The patient is a 54 y.o. year-old HTN, etoh abuse,ESRD on HD, anemia , aortic stenosis, active smoker who presented this am to ED w/ c/o bilat leg and scrotal edema. Missed last 2 HD sessions. Also states he passed out about 5 times while trying to have a BM at home.  Our records show pt missed his last 3 HD sessions. We are asked to see for HD.    Pt seen in ED on stretcher.  Snoring respirations, diffusely swollen all over.  Denies any CP, abd pain. +SOB and cough.  No fevers.     ROS  denies CP  no joint pain   no HA  no blurry vision  no rash  no diarrhea  no nausea/ vomiting   Past Medical History  Past Medical History:  Diagnosis Date  . Anemia   . Aortic atherosclerosis ([place]) 11/12/2019  . CKD (chronic kidney disease)    Stage 5  Dialysis - M/W/F in [place], [place]  . Dyspnea    tx with inhaler when sick  . ED (erectile dysfunction)   . Emphysema of lung ([place]) 11/12/2019  . ETOH abuse   . History of blood transfusion   . Hypertension   . Wears dentures    Past Surgical History  Past Surgical History:  Procedure Laterality Date  . BASCILIC VEIN TRANSPOSITION Left 08/07/2016   Procedure: LEFT BASILIC VEIN TRANSPOSITION;  Surgeon: Angelia Mould, MD;  Location: [place];  Service: Vascular;  Laterality: Left;  . CLOSED REDUCTION NASAL FRACTURE N/A 08/11/2019   Procedure: CLOSED REDUCTION NASAL FRACTURE WITH STABILIZATION;  Surgeon: Irene Limbo, MD;  Location: [place];  Service: Plastics;  Laterality: N/A;  . COLONOSCOPY N/A 08/12/2016   Procedure: COLONOSCOPY;  Surgeon: Otis Brace, MD;  Location: [place];  Service: Gastroenterology;  Laterality: N/A;  . ESOPHAGOGASTRODUODENOSCOPY N/A 08/12/2016   Procedure: ESOPHAGOGASTRODUODENOSCOPY (EGD);   Surgeon: Otis Brace, MD;  Location: [place];  Service: Gastroenterology;  Laterality: N/A;  . INSERTION OF DIALYSIS CATHETER N/A 08/07/2016   Procedure: INSERTION OF TUNNELED DIALYSIS CATHETER;  Surgeon: Angelia Mould, MD;  Location: [place];  Service: Vascular;  Laterality: N/A;  . LIGATION OF ARTERIOVENOUS  FISTULA Left 09/12/2016   Procedure: BANDING OF LEFT  ARTERIOVENOUS  FISTULA;  Surgeon: Angelia Mould, MD;  Location: [place];  Service: Vascular;  Laterality: Left;  . LOWER EXTREMITY INTERVENTION Right 12/02/2018   Procedure: LOWER EXTREMITY INTERVENTION;  Surgeon: Algernon Huxley, MD;  Location: [facility];  Service: Cardiovascular;  Laterality: Right;   Family History  Family History  Problem Relation Age of Onset  . Diabetes Mother   . Kidney failure Mother   . Healthy Father   . Kidney failure Brother   . Healthy Sister   . Kidney disease Daughter   . Post-traumatic stress disorder Neg Hx   . Bladder Cancer Neg Hx   . Kidney cancer Neg Hx    Social History  reports that he has been smoking cigarettes. He has a 20.00 pack-year smoking history. He has never used smokeless tobacco. He reports current alcohol use of about 21.0 standard drinks of alcohol per week. He reports that he does not use drugs. Allergies No Known Allergies Home medications Prior to Admission medications   Medication Sig  Start Date End Date Taking? Authorizing Provider  acetaminophen (TYLENOL) 500 MG tablet Take 1,000 mg by mouth every 6 (six) hours as needed for mild pain or fever.  03/09/19 03/07/20 Yes [provider]  albuterol (PROVENTIL HFA;VENTOLIN HFA) 108 (90 Base) MCG/ACT inhaler Inhale 2 puffs into the lungs every 4 (four) hours as needed for wheezing or shortness of breath. 10/03/18  Yes Orpah Greek, MD  calcitRIOL (ROCALTROL) 0.5 MCG capsule Take 1 capsule (0.5 mcg total) by mouth every Monday, Wednesday, and Friday with hemodialysis. 10/24/19  Yes  Pokhrel, L[place], MD  calcium acetate (PHOSLO) 667 MG capsule Take 2 capsules (1,334 mg total) by mouth 3 (three) times daily with meals. 02/12/19  Yes Fritzi Mandes, MD  camphor-menthol [facility]) lotion Apply 1 application topically as needed for itching. 11/23/19  Yes Upstill, Nehemiah Settle, PA-C  hydrOXYzine (ATARAX/VISTARIL) 50 MG tablet Take 50 mg by mouth 3 (three) times daily. 08/09/19  Yes [provider]  sildenafil (VIAGRA) 50 MG tablet Take 50 mg by mouth daily as needed for erectile dysfunction. 07/20/19  Yes [provider]  triamcinolone cream (KENALOG) 0.1 % Apply 1 application topically 2 (two) times daily. 11/23/19  Yes Charlann Lange, PA-C     Vitals:   01/24/20 1000 01/24/20 1030 01/24/20 1100 01/24/20 1130  BP: (!) 166/112 (!) 166/103 (!) 178/111 (!) 163/103  Pulse: 97 (!) 103 100 88  Resp: (!) 23  18 18   Temp:      TempSrc:      SpO2: 95% 95% 100% 96%  Weight:      Height:       Exam Gen pt lethargic No rash, cyanosis or gangrene Sclera anicteric, throat clear No jvd or bruits Chest bilat rales 1/3 up RRR no MRG Abd soft ntnd no mass or ascites +bs GU sig scrotal / penile edema MS no joint effusions or deformity Ext diffuse LE and facial edema Neuro is lethargic but cooperative and Ox 3 LUA AVF+bruit    Home meds:  - rocaltrol mwf 0.5 ug/ phoslo 3 ac tid  - prn's/ vitamins/ supplements     Outpt HD: MWF [place] [facility]  4h  450/800  68kg  2/2 bath  LUA AVF  Hep none  - mircera 225 last 4/2  - venofer 50/wk  - sensipar 90 tiw  - calcitriol 1 ug   Assessment/ Plan: 1. Volume overload - w/ anasarca/ body edema due to multiple missed HD sessions +/- fluid indiscretion.  Pt looks sick and needs HD, will plan on HD this afternoon and again tomorrow, get vol and solute down.  2. Hyperkalemia - rx w/ HD 3. ESRD - on HD MWF.  Missed HD x 3 4. Anemia ckd - next esa is due, will order 5. MBD ckd - cont meds /binders, rocaltrol 6. HTN - not sure if  taking home BP meds, none listed per pta meds.       Kelly Splinter  MD 01/24/2020, 12:09 PM  Recent Labs  Lab 01/24/20 0147  WBC 8.5  HGB 8.7*   Recent Labs  Lab 01/24/20 0147  K 6.3*  BUN 114*  CREATININE 20.08*  CALCIUM 9.5

## 2020-01-24 NOTE — Procedures (Signed)
   I was present at this dialysis session, have reviewed the session itself and made  appropriate changes Kelly Splinter MD [place] pager [phone]   01/24/2020, 1:18 PM

## 2020-01-24 NOTE — ED Notes (Signed)
Lunch Tray Ordered @ 1033. 

## 2020-01-24 NOTE — ED Triage Notes (Signed)
Pt comes from home, missed dialysis on Friday and today, now having swelling in bilateral legs and testicular swelling. Pt also reports 4-5 syncopal episodes today as well.

## 2020-01-24 NOTE — ED Notes (Signed)
Provided patient with food/drink

## 2020-01-24 NOTE — ED Provider Notes (Addendum)
[facility] EMERGENCY DEPARTMENT Provider Note   CSN: [id number] Arrival date & time: 01/24/20  0119     History Chief Complaint  Patient presents with  . Shortness of Breath  . Loss of Consciousness    Jordan [name] is a 54 y.o. male with a history of ESRD on dialysis MWF, hypertension, CHF, tobacco abuse, hypertension, anemia, & EtOH abuse who presents to the ED with complaints of peripheral edema, dyspnea, and syncopal episodes. Patient states that over the past few days he has had progressively worsening peripheral edema to the bilateral lower extremities extending to the testicles, he states this is very uncomfortable, he has developed associated shortness of breath that is worse with movement, no alleviating factors.  He also relates that he has had 3 syncopal episodes in the past couple of days, each have been when he is straining to have a bowel movement, he states he becomes lightheaded and loses consciousness.  He does not believe he had any head injuries.  He is unsure of the last time he went to dialysis, he states that he definitely missed today and Friday.  Denies fever, chills, cough, chest pain, abdominal pain, vomiting, melena, hematochezia, numbness, or weakness.  States he has not passed out previously.  HPI     Past Medical History:  Diagnosis Date  . Anemia   . Aortic atherosclerosis ([place]) 11/12/2019  . CKD (chronic kidney disease)    Stage 5  Dialysis - M/W/F in [place], [place]  . Dyspnea    tx with inhaler when sick  . ED (erectile dysfunction)   . Emphysema of lung ([place]) 11/12/2019  . ETOH abuse   . History of blood transfusion   . Hypertension   . Wears dentures     Patient Active Problem List   Diagnosis Date Noted  . Fluid overload 11/12/2019  . Acute respiratory failure with hypoxia ([place]) 11/12/2019  . Aortic atherosclerosis ([place]) 11/12/2019  . Emphysema of lung ([place]) 11/12/2019  . Acute pulmonary edema ([place]) 10/31/2019  . Acute  on chronic congestive heart failure ([place]) 10/22/2019  . Acute respiratory distress 10/06/2019  . Volume overload 10/05/2019  . Hypokalemia 10/05/2019  . ESRD (end stage renal disease) ([place]) 10/01/2019  . Leg mass, right   . Hyperbilirubinemia   . Prolonged QT interval 09/19/2019  . Symptomatic anemia 09/19/2019  . Pruritus 09/19/2019  . Hemoptysis 02/11/2019  . Hypercalcemia 01/19/2019  . Anemia in ESRD (end-stage renal disease) ([place]) 05/17/2018  . Hepatitis B 03/19/2018  . ESRD on dialysis ([place]) 05/26/2017  . Essential hypertension 05/26/2017  . Urinary tract infection, site not specified 10/17/2016  . Coagulation defect, unspecified ([place]) 08/13/2016  . Diarrhea, unspecified 08/13/2016  . Encounter for immunization 08/13/2016  . Headache, unspecified 08/13/2016  . Hypertensive chronic kidney disease with stage 5 chronic kidney disease or end stage renal disease ([place]) 08/13/2016  . Iron deficiency anemia, unspecified 08/13/2016  . Pain, unspecified 08/13/2016  . Moderate protein-calorie malnutrition ([place]) 08/13/2016  . Secondary hyperparathyroidism of renal origin ([place]) 08/13/2016    Past Surgical History:  Procedure Laterality Date  . BASCILIC VEIN TRANSPOSITION Left 08/07/2016   Procedure: LEFT BASILIC VEIN TRANSPOSITION;  Surgeon: Angelia Mould, MD;  Location: [facility];  Service: Vascular;  Laterality: Left;  . CLOSED REDUCTION NASAL FRACTURE N/A 08/11/2019   Procedure: CLOSED REDUCTION NASAL FRACTURE WITH STABILIZATION;  Surgeon: Irene Limbo, MD;  Location: [facility];  Service: Plastics;  Laterality: N/A;  . COLONOSCOPY N/A 08/12/2016  Procedure: COLONOSCOPY;  Surgeon: Otis Brace, MD;  Location: [facility];  Service: Gastroenterology;  Laterality: N/A;  . ESOPHAGOGASTRODUODENOSCOPY N/A 08/12/2016   Procedure: ESOPHAGOGASTRODUODENOSCOPY (EGD);  Surgeon: Otis Brace, MD;  Location: [facility];  Service: Gastroenterology;  Laterality: N/A;  . INSERTION OF  DIALYSIS CATHETER N/A 08/07/2016   Procedure: INSERTION OF TUNNELED DIALYSIS CATHETER;  Surgeon: Angelia Mould, MD;  Location: [facility];  Service: Vascular;  Laterality: N/A;  . LIGATION OF ARTERIOVENOUS  FISTULA Left 09/12/2016   Procedure: BANDING OF LEFT  ARTERIOVENOUS  FISTULA;  Surgeon: Angelia Mould, MD;  Location: [facility];  Service: Vascular;  Laterality: Left;  . LOWER EXTREMITY INTERVENTION Right 12/02/2018   Procedure: LOWER EXTREMITY INTERVENTION;  Surgeon: Algernon Huxley, MD;  Location: [facility];  Service: Cardiovascular;  Laterality: Right;       Family History  Problem Relation Age of Onset  . Diabetes Mother   . Kidney failure Mother   . Healthy Father   . Kidney failure Brother   . Healthy Sister   . Kidney disease Daughter   . Post-traumatic stress disorder Neg Hx   . Bladder Cancer Neg Hx   . Kidney cancer Neg Hx     Social History   Tobacco Use  . Smoking status: Current Every Day Smoker    Packs/day: 0.50    Years: 40.00    Pack years: 20.00    Types: Cigarettes  . Smokeless tobacco: Never Used  Substance Use Topics  . Alcohol use: Yes    Alcohol/week: 21.0 standard drinks    Types: 21 Cans of beer per week    Comment: none since 08/04/16  . Drug use: No    Home Medications Prior to Admission medications   Medication Sig Start Date End Date Taking? Authorizing Provider  acetaminophen (TYLENOL) 500 MG tablet Take 1,000 mg by mouth every 6 (six) hours as needed. 03/09/19 03/07/20  [provider]  albuterol (PROVENTIL HFA;VENTOLIN HFA) 108 (90 Base) MCG/ACT inhaler Inhale 2 puffs into the lungs every 4 (four) hours as needed for wheezing or shortness of breath. 10/03/18   Orpah Greek, MD  calcitRIOL (ROCALTROL) 0.5 MCG capsule Take 1 capsule (0.5 mcg total) by mouth every Monday, Wednesday, and Friday with hemodialysis. 10/24/19   Pokhrel, Corrie Mckusick, MD  calcium acetate (PHOSLO) 667 MG capsule Take 2 capsules (1,334 mg  total) by mouth 3 (three) times daily with meals. 02/12/19   Fritzi Mandes, MD  camphor-menthol [facility]) lotion Apply 1 application topically as needed for itching. 11/23/19   Charlann Lange, PA-C  hydrOXYzine (ATARAX/VISTARIL) 50 MG tablet Take 50 mg by mouth 3 (three) times daily. 08/09/19   [provider]  nicotine (NICODERM CQ - DOSED IN MG/24 HOURS) 14 mg/24hr patch Place 1 patch (14 mg total) onto the skin daily. Patient not taking: Reported on 11/12/2019 10/08/19   Radene Gunning, NP  sildenafil (VIAGRA) 50 MG tablet Take 50 mg by mouth daily as needed for erectile dysfunction. 07/20/19   [provider]  triamcinolone cream (KENALOG) 0.1 % Apply 1 application topically 2 (two) times daily. 11/23/19   Charlann Lange, PA-C    Allergies    Patient has no known allergies.  Review of Systems   Review of Systems  Constitutional: Negative for chills and fever.  Respiratory: Positive for shortness of breath. Negative for cough.   Cardiovascular: Positive for leg swelling. Negative for chest pain.  Gastrointestinal: Negative for abdominal pain, blood in stool,  diarrhea and vomiting.  Genitourinary: Positive for scrotal swelling.  Neurological: Positive for syncope. Negative for seizures, speech difficulty, weakness, numbness and headaches.  All other systems reviewed and are negative.   Physical Exam Updated Vital Signs BP (!) 166/101 (BP Location: Right Arm)   Pulse (!) 101   Temp 98.1 F (36.7 C)   Resp 18   SpO2 (!) 86%   Physical Exam Vitals and nursing note reviewed.  Constitutional:      General: He is not in acute distress.    Appearance: He is well-developed. He is not toxic-appearing.  HENT:     Head: Normocephalic and atraumatic.  Eyes:     General:        Right eye: No discharge.        Left eye: No discharge.     Conjunctiva/sclera: Conjunctivae normal.  Cardiovascular:     Rate and Rhythm: Normal rate and regular rhythm.  Pulmonary:     Effort: No  respiratory distress.     Breath sounds: Decreased breath sounds (Bibasilar) and wheezing (Expiratory.) present. No rhonchi.     Comments: SPO2 85 to 91% on room air, improved to mid 90s on 2 L via nasal cannula. Abdominal:     General: There is no distension.     Palpations: Abdomen is soft.     Tenderness: There is no abdominal tenderness.  Musculoskeletal:     Cervical back: Neck supple.     Comments: Patient has symmetric 3+ pitting edema to the lower legs which extends to the upper legs as well as to the scrotum.  Chaperone present.  Skin:    General: Skin is warm and dry.     Findings: No rash.  Neurological:     General: No focal deficit present.     Mental Status: He is alert.     Comments: Clear speech.   Psychiatric:        Behavior: Behavior normal.    ED Results / Procedures / Treatments   Labs (all labs ordered are listed, but only abnormal results are displayed) Labs Reviewed  BASIC METABOLIC PANEL - Abnormal; Notable for the following components:      Result Value   Potassium 6.3 (*)    CO2 21 (*)    Glucose, Bld 100 (*)    BUN 114 (*)    Creatinine, Ser 20.08 (*)    GFR calc non Af Amer 2 (*)    GFR calc Af Amer 3 (*)    Anion gap 19 (*)    All other components within normal limits  CBC - Abnormal; Notable for the following components:   RBC 2.55 (*)    Hemoglobin 8.7 (*)    HCT 28.7 (*)    MCV 112.5 (*)    MCH 34.1 (*)    RDW 16.9 (*)    All other components within normal limits  URINALYSIS, ROUTINE W REFLEX MICROSCOPIC  CBG MONITORING, ED    EKG EKG Interpretation  Date/Time:  Tuesday Jan 24 2020 01:29:01 EDT Ventricular Rate:  100 PR Interval:  134 QRS Duration: 78 QT Interval:  356 QTC Calculation: 459 R Axis:   76 Text Interpretation: Normal sinus rhythm Normal ECG Confirmed by Pryor Curia [phone]) on 01/24/2020 3:49:34 AM   Radiology DG Chest 2 View  Result Date: 01/24/2020 CLINICAL DATA:  54 year old male with shortness of breath.  EXAM: CHEST - 2 VIEW COMPARISON:  Chest radiograph dated 12/09/2019. FINDINGS: There is cardiomegaly with vascular congestion  and probable mild interstitial edema. Pneumonia is not excluded. Clinical correlation is recommended. No focal consolidation, pleural effusion, pneumothorax. Atherosclerotic calcification of the aorta. No acute osseous pathology. IMPRESSION: Cardiomegaly with vascular congestion and probable mild interstitial edema. Pneumonia is not excluded. Electronically Signed   By: Anner Crete M.D.   On: 01/24/2020 01:59    Procedures  04:00 Cardiac monitoring reveals 95NSR as reviewed and interpreted by me. Cardiac monitoring was ordered due to syncope and to monitor patient for dysrhythmia.  .Critical Care Performed by: Amaryllis Dyke, PA-C Authorized by: Amaryllis Dyke, PA-C    CRITICAL CARE Performed by: Kennith Maes   Total critical care time: 30 minutes  Critical care time was exclusive of separately billable procedures and treating other patients.  Critical care was necessary to treat or prevent imminent or life-threatening deterioration.  Critical care was time spent personally by me on the following activities: development of treatment plan with patient and/or surrogate as well as nursing, discussions with consultants, evaluation of patient's response to treatment, examination of patient, obtaining history from patient or surrogate, ordering and performing treatments and interventions, ordering and review of laboratory studies, ordering and review of radiographic studies, pulse oximetry and re-evaluation of patient's condition.  (including critical care time)  Medications Ordered in ED Medications  sodium chloride flush (NS) 0.9 % injection 3 mL (has no administration in time range)    ED Course  I have reviewed the triage vital signs and the nursing notes.  Pertinent labs & imaging results that were available during my care of the  patient were reviewed by me and considered in my medical decision making (see chart for details).    MDM Rules/Calculators/A&P                     Patient presents to the ED with complaints of peripheral edema, dyspnea, and syncope.  Recent missed dialysis sessions.  Patient notably hypoxic on room air, blood pressure is elevated.  He has bibasilar wheezing with decreased breath sounds.  He has significant peripheral edema noted.  Additional history obtained:  Previous records obtained and reviewed.   EKG: NSR, no significant interval or T wave changes. Lab Tests:  I Ordered, reviewed, and interpreted labs, which included:  CBC: Anemia similar to baseline ranges.  No leukocytosis. BMP: Hyperkalemia.  Findings consistent with ESRD.  Imaging Studies ordered:  I ordered imaging studies which included CXR, I independently visualized and interpreted imaging which showed cardiomegaly with vascular congestion and probable mild interstitial edema.  ED Course:   Patient with presentation concerning for acute pulmonary edema in the setting of missed dialysis sessions as well as multiple syncopal episodes.  Per radiology read pneumonia not excluded, however patient is afebrile, no leukocytosis, no coughing, therefore feel this is less likely.  He is hyperkalemic at 6.3, no significant concerning EKG changes.  Will discuss with nephrology on-call and plan for admission.  04:00: CONSULT: Discussed with nephrologist Dr. Justin Mend, likely dialyze within the next 1-2 hours, no need for ED to treat hyperkalemia at this time, appreciate consultation.   04:17: CONSULT: Discussed with hospitalist Dr. Olevia Bowens- accepts admission.   This is a shared visit with supervising physician Dr. Leonides Schanz who has independently evaluated patient & provided guidance in evaluation/management/disposition, in agreement with care   Portions of this note were generated with Dragon dictation software. Dictation errors may occur despite  best attempts at proofreading.  Final Clinical Impression(s) / ED Diagnoses Final diagnoses:  ESRD (  end stage renal disease) ([place])  Hyperkalemia  Syncope, unspecified syncope type  Acute pulmonary edema [facility])    Rx / DC Orders ED Discharge Orders    None       Amaryllis Dyke, PA-C 01/24/20 0240  Jordan [name] was evaluated in Emergency Department on 01/24/2020 for the symptoms described in the history of present illness. He/she was evaluated in the context of the global COVID-19 pandemic, which necessitated consideration that the patient might be at risk for infection with the SARS-CoV-2 virus that causes COVID-19. Institutional protocols and algorithms that pertain to the evaluation of patients at risk for COVID-19 are in a state of rapid change based on information released by regulatory bodies including the CDC and federal and state organizations. These policies and algorithms were followed during the patient's care in the ED.     Amaryllis Dyke, PA-C 01/24/20 9735    Ward, Delice Bison, DO 01/24/20 [phone]

## 2020-01-24 NOTE — Progress Notes (Signed)
PT Cancellation Note  Patient Details Name: Jordan [name] MRN: [id number] DOB: 1966-02-10   Cancelled Treatment:    Reason Eval/Treat Not Completed: Patient at procedure or test/unavailable  Jordan [name], PT Acute Rehab Services Pager [phone] [facility] Rehab [phone] [facility] [phone]    Jordan [name] 01/24/2020, 4:47 PM

## 2020-01-24 NOTE — Progress Notes (Signed)
Echocardiogram 2D Echocardiogram has been performed.  [place] Deputy Malarie Tappen 01/24/2020, 10:09 AM

## 2020-01-25 DIAGNOSIS — D539 Nutritional anemia, unspecified: Secondary | ICD-10-CM | POA: Diagnosis present

## 2020-01-25 DIAGNOSIS — Z9115 Patient's noncompliance with renal dialysis: Secondary | ICD-10-CM | POA: Diagnosis not present

## 2020-01-25 DIAGNOSIS — F1021 Alcohol dependence, in remission: Secondary | ICD-10-CM | POA: Diagnosis present

## 2020-01-25 DIAGNOSIS — Z992 Dependence on renal dialysis: Secondary | ICD-10-CM | POA: Diagnosis not present

## 2020-01-25 DIAGNOSIS — R55 Syncope and collapse: Secondary | ICD-10-CM | POA: Diagnosis not present

## 2020-01-25 DIAGNOSIS — N2581 Secondary hyperparathyroidism of renal origin: Secondary | ICD-10-CM | POA: Diagnosis present

## 2020-01-25 DIAGNOSIS — Z841 Family history of disorders of kidney and ureter: Secondary | ICD-10-CM | POA: Diagnosis not present

## 2020-01-25 DIAGNOSIS — F17210 Nicotine dependence, cigarettes, uncomplicated: Secondary | ICD-10-CM | POA: Diagnosis present

## 2020-01-25 DIAGNOSIS — E875 Hyperkalemia: Secondary | ICD-10-CM | POA: Diagnosis not present

## 2020-01-25 DIAGNOSIS — E8770 Fluid overload, unspecified: Secondary | ICD-10-CM | POA: Diagnosis not present

## 2020-01-25 DIAGNOSIS — R0683 Snoring: Secondary | ICD-10-CM | POA: Diagnosis present

## 2020-01-25 DIAGNOSIS — Z833 Family history of diabetes mellitus: Secondary | ICD-10-CM | POA: Diagnosis not present

## 2020-01-25 DIAGNOSIS — I132 Hypertensive heart and chronic kidney disease with heart failure and with stage 5 chronic kidney disease, or end stage renal disease: Secondary | ICD-10-CM | POA: Diagnosis present

## 2020-01-25 DIAGNOSIS — Z79899 Other long term (current) drug therapy: Secondary | ICD-10-CM | POA: Diagnosis not present

## 2020-01-25 DIAGNOSIS — J439 Emphysema, unspecified: Secondary | ICD-10-CM | POA: Diagnosis present

## 2020-01-25 DIAGNOSIS — Z20822 Contact with and (suspected) exposure to covid-19: Secondary | ICD-10-CM | POA: Diagnosis present

## 2020-01-25 DIAGNOSIS — I5033 Acute on chronic diastolic (congestive) heart failure: Secondary | ICD-10-CM | POA: Diagnosis present

## 2020-01-25 DIAGNOSIS — D631 Anemia in chronic kidney disease: Secondary | ICD-10-CM | POA: Diagnosis present

## 2020-01-25 DIAGNOSIS — I120 Hypertensive chronic kidney disease with stage 5 chronic kidney disease or end stage renal disease: Secondary | ICD-10-CM | POA: Diagnosis not present

## 2020-01-25 DIAGNOSIS — N186 End stage renal disease: Secondary | ICD-10-CM | POA: Diagnosis not present

## 2020-01-25 LAB — COMPREHENSIVE METABOLIC PANEL
ALT: 13 U/L (ref 0–44)
AST: 14 U/L — ABNORMAL LOW (ref 15–41)
Albumin: 2.8 g/dL — ABNORMAL LOW (ref 3.5–5.0)
Alkaline Phosphatase: 91 U/L (ref 38–126)
Anion gap: 14 (ref 5–15)
BUN: 60 mg/dL — ABNORMAL HIGH (ref 6–20)
CO2: 24 mmol/L (ref 22–32)
Calcium: 8.8 mg/dL — ABNORMAL LOW (ref 8.9–10.3)
Chloride: 101 mmol/L (ref 98–111)
Creatinine, Ser: 13.08 mg/dL — ABNORMAL HIGH (ref 0.61–1.24)
GFR calc Af Amer: 4 mL/min — ABNORMAL LOW (ref >60)
GFR calc non Af Amer: 4 mL/min — ABNORMAL LOW (ref >60)
Glucose, Bld: 134 mg/dL — ABNORMAL HIGH (ref 70–99)
Potassium: 4.7 mmol/L (ref 3.5–5.1)
Sodium: 139 mmol/L (ref 135–145)
Total Bilirubin: 1.3 mg/dL — ABNORMAL HIGH (ref 0.3–1.2)
Total Protein: 6.3 g/dL — ABNORMAL LOW (ref 6.5–8.1)

## 2020-01-25 LAB — CBC
HCT: 26.8 % — ABNORMAL LOW (ref 39.0–52.0)
Hemoglobin: 8.2 g/dL — ABNORMAL LOW (ref 13.0–17.0)
MCH: 33.2 pg (ref 26.0–34.0)
MCHC: 30.6 g/dL (ref 30.0–36.0)
MCV: 108.5 fL — ABNORMAL HIGH (ref 80.0–100.0)
Platelets: 159 10*3/uL (ref 150–400)
RBC: 2.47 MIL/uL — ABNORMAL LOW (ref 4.22–5.81)
RDW: 16 % — ABNORMAL HIGH (ref 11.5–15.5)
WBC: 7.6 10*3/uL (ref 4.0–10.5)
nRBC: 0 % (ref 0.0–0.2)

## 2020-01-25 LAB — GLUCOSE, CAPILLARY: Glucose-Capillary: 72 mg/dL (ref 70–99)

## 2020-01-25 NOTE — Progress Notes (Signed)
PT Cancellation Note  Patient Details Name: Jordan [name] MRN: [id number] DOB: 18-Apr-1966   Cancelled Treatment:    Reason Eval/Treat Not Completed: Fatigue/lethargy limiting ability to participate.  Declining PT, reports he does not feel up to being OOB and will prefer a tx at another time.    Ramond Dial 01/25/2020, 11:58 AM   Mee Hives, PT MS Acute Rehab Dept. Number: [place]

## 2020-01-25 NOTE — Progress Notes (Signed)
Progress Note    KATHY WARES  HOZ:[id number] DOB: [birth year]  DOA: 01/24/2020 PCP: Jearld Fenton, NP    Brief Narrative:    Medical records reviewed and are as summarized below:  Jordan [name] is an 54 y.o. male with a past medical history that includes end-stage renal disease on dialysis, aortic stenosis, chronic anemia, emphysema, EtOH abuse, hypertension admitted to the hospital May 4 due to worsening lower extremity edema up to his testicles likely related to noncompliance with dialysis in addition to reported syncopal events while trying to have a bowel movement.  Assessment/Plan:   Principal Problem:   Syncopal episodes Active Problems:   ESRD on dialysis (Person)   Hyperkalemia   Essential hypertension   Macrocytic anemia   #1.  Syncopal episodes.  Likely vasovagal in the setting of missed dialysis.  Reportedly he had 5 events prior to coming to the hospital while he was trying to have bowel movements.  Echo reveals an EF of 45 to 50% with grade 3 diastolic dysfunction and global hypokinesis as well as moderate left ventricular hypertrophy. no Events on telemetry. Dialysis for metobolic derangements. No s/sx infection -Obtain orthostatics -Consider carotid Dopplers -Stool softener  #2.  End-stage renal disease on dialysis/hyperkalemia/volume overload.  Patient is a Monday Wednesday Friday dialysis patient and he missed 2 consecutive sessions.  Unable to articulate why he missed those sessions.  He was evaluated by nephrology who opined volume overload with anasarca/body edema due to multiple missed hemodialysis sessions plus or minus fluid indiscretion and recommended emergent dialysis.  He underwent dialysis then and is undergoing dialysis again today. -Appreciate nephrology's assistance  #3.  Hypertension.  Fair control.  No antihypertensive meds on his home medication list.  Norvasc and beta-blocker added -Monitor -As needed hydralazine  #4.  Anemia of chronic  disease.  Stable at baseline  #5.  History of aortic stenosis.  Echocardiogram as noted above. -Blood pressure control -Labetalol as noted above  #6.  Emphysema/tobacco use.  Not on home oxygen.  Oxygen saturation level greater than 90% -As needed nebs -Cessation counseling offered  #7.  History of EtOH abuse.  Denies any recent drinking -monitor   Family Communication/Anticipated D/C date and plan/Code Status   DVT prophylaxis: scd ordered. Code Status: Full Code.  Family Communication: patient with questions answered Disposition Plan: Status is: Inpatient  Remains inpatient appropriate because:Inpatient level of care appropriate due to severity of illness   Dispo: The patient is from: Home              Anticipated d/c is to: Home              Anticipated d/c date is: 1 day              Patient currently is not medically stable to d/c.          Medical Consultants:    neprhology   Anti-Infectives:    None  Subjective:   Arouses to verbal stimuli in dialysis. Denies pain/discomfort resting quietly snoring  Objective:    Vitals:   01/25/20 0900 01/25/20 0930 01/25/20 1000 01/25/20 1030  BP: 125/60 (!) 145/92 136/80 (!) 119/57  Pulse: 83 89 85 87  Resp: 17 16 16 17   Temp:      TempSrc:      SpO2:      Weight:      Height:        Intake/Output Summary (Last 24 hours) at 01/25/2020 1409  Last data filed at 01/24/2020 1828 Gross per 24 hour  Intake 417 ml  Output 3000 ml  Net -2583 ml   Filed Weights   01/24/20 0338 01/25/20 0500 01/25/20 0757  Weight: 71.2 kg 78.2 kg 78.8 kg    Exam: General: Resting quietly snoring no acute distress CV regular rate and rhythm no murmur gallop or rub 2+ pitting edema lower extremities bilaterally up to above knees Respiratory: No increased work of breathing breath sounds distant but clear no crackles Abdomen: Nondistended soft positive bowel sounds throughout nontender to palpation Skeletal: Joints without  swelling/erythema moves all extremities spontaneously Neuro: Eyes closed arouses to verbal stimuli moves all extremities spontaneously follows commands oriented x3  Data Reviewed:   I have personally reviewed following labs and imaging studies:  Labs: Labs show the following:   Basic Metabolic Panel: Recent Labs  Lab 01/24/20 0147 01/24/20 0147 01/24/20 1930 01/25/20 0500  NA 141  --   --  139  K 6.3*   < > 3.9 4.7  CL 101  --   --  101  CO2 21*  --   --  24  GLUCOSE 100*  --   --  134*  BUN 114*  --   --  60*  CREATININE 20.08*  --   --  13.08*  CALCIUM 9.5  --   --  8.8*   < > = values in this interval not displayed.   GFR Estimated Creatinine Clearance: 6.3 mL/min (A) (by C-G formula based on SCr of 13.08 mg/dL (H)). Liver Function Tests: Recent Labs  Lab 01/25/20 0500  AST 14*  ALT 13  ALKPHOS 91  BILITOT 1.3*  PROT 6.3*  ALBUMIN 2.8*   No results for input(s): LIPASE, AMYLASE in the last 168 hours. No results for input(s): AMMONIA in the last 168 hours. Coagulation profile No results for input(s): INR, PROTIME in the last 168 hours.  CBC: Recent Labs  Lab 01/24/20 0147 01/25/20 0500  WBC 8.5 7.6  HGB 8.7* 8.2*  HCT 28.7* 26.8*  MCV 112.5* 108.5*  PLT 181 159   Cardiac Enzymes: No results for input(s): CKTOTAL, CKMB, CKMBINDEX, TROPONINI in the last 168 hours. BNP (last 3 results) No results for input(s): PROBNP in the last 8760 hours. CBG: Recent Labs  Lab 01/24/20 0452 01/25/20 0545  GLUCAP 143* 72   D-Dimer: No results for input(s): DDIMER in the last 72 hours. Hgb A1c: No results for input(s): HGBA1C in the last 72 hours. Lipid Profile: No results for input(s): CHOL, HDL, LDLCALC, TRIG, CHOLHDL, LDLDIRECT in the last 72 hours. Thyroid function studies: No results for input(s): TSH, T4TOTAL, T3FREE, THYROIDAB in the last 72 hours.  Invalid input(s): FREET3 Anemia work up: No results for input(s): VITAMINB12, FOLATE, FERRITIN, TIBC,  IRON, RETICCTPCT in the last 72 hours. Sepsis Labs: Recent Labs  Lab 01/24/20 0147 01/25/20 0500  WBC 8.5 7.6    Microbiology Recent Results (from the past 240 hour(s))  Respiratory Panel by RT PCR (Flu A&B, Covid) - Nasopharyngeal Swab     Status: None   Collection Time: 01/24/20  3:52 AM   Specimen: Nasopharyngeal Swab  Result Value Ref Range Status   SARS Coronavirus 2 by RT PCR NEGATIVE NEGATIVE Final    Comment: (NOTE) SARS-CoV-2 target nucleic acids are NOT DETECTED. The SARS-CoV-2 RNA is generally detectable in upper respiratoy specimens during the acute phase of infection. The lowest concentration of SARS-CoV-2 viral copies this assay can detect is 131 copies/mL. A  negative result does not preclude SARS-Cov-2 infection and should not be used as the sole basis for treatment or other patient management decisions. A negative result may occur with  improper specimen collection/handling, submission of specimen other than nasopharyngeal swab, presence of viral mutation(s) within the areas targeted by this assay, and inadequate number of viral copies (<131 copies/mL). A negative result must be combined with clinical observations, patient history, and epidemiological information. The expected result is Negative. Fact Sheet for Patients:  PinkCheek.be Fact Sheet for Healthcare Providers:  GravelBags.it This test is not yet ap proved or cleared by the [place] FDA and  has been authorized for detection and/or diagnosis of SARS-CoV-2 by FDA under an Emergency Use Authorization (EUA). This EUA will remain  in effect (meaning this test can be used) for the duration of the COVID-19 declaration under Section 564(b)(1) of the Act, 21 U.S.C. section 360bbb-3(b)(1), unless the authorization is terminated or revoked sooner.    Influenza A by PCR NEGATIVE NEGATIVE Final   Influenza B by PCR NEGATIVE NEGATIVE Final     Comment: (NOTE) The Xpert Xpress SARS-CoV-2/FLU/RSV assay is intended as an aid in  the diagnosis of influenza from Nasopharyngeal swab specimens and  should not be used as a sole basis for treatment. Nasal washings and  aspirates are unacceptable for Xpert Xpress SARS-CoV-2/FLU/RSV  testing. Fact Sheet for Patients: PinkCheek.be Fact Sheet for Healthcare Providers: GravelBags.it This test is not yet approved or cleared by the [place] FDA and  has been authorized for detection and/or diagnosis of SARS-CoV-2 by  FDA under an Emergency Use Authorization (EUA). This EUA will remain  in effect (meaning this test can be used) for the duration of the  Covid-19 declaration under Section 564(b)(1) of the Act, 21  U.S.C. section 360bbb-3(b)(1), unless the authorization is  terminated or revoked. Performed at [facility], [place] [address], [place], [place] [address]     Procedures and diagnostic studies:  DG Chest 2 View  Result Date: 01/24/2020 CLINICAL DATA:  54 year old male with shortness of breath. EXAM: CHEST - 2 VIEW COMPARISON:  Chest radiograph dated 12/09/2019. FINDINGS: There is cardiomegaly with vascular congestion and probable mild interstitial edema. Pneumonia is not excluded. Clinical correlation is recommended. No focal consolidation, pleural effusion, pneumothorax. Atherosclerotic calcification of the aorta. No acute osseous pathology. IMPRESSION: Cardiomegaly with vascular congestion and probable mild interstitial edema. Pneumonia is not excluded. Electronically Signed   By: Anner Crete M.D.   On: 01/24/2020 01:59   ECHOCARDIOGRAM COMPLETE  Result Date: 01/24/2020    ECHOCARDIOGRAM REPORT   Patient Name:   Jordan [name] Date of Exam: 01/24/2020 Medical Rec #:  [id number]        Height:       64.0 in Accession #:    [id number]       Weight:       157.0 lb Date of Birth:  [birth year]        BSA:          1.765 m  Patient Age:    69 years         BP:           187/113 mmHg Patient Gender: M                HR:           99 bpm. Exam Location:  Inpatient Procedure: 2D Echo, Color Doppler and Cardiac Doppler Indications:    R55 Syncope  History:        Patient has no prior history of Echocardiogram examinations.                 Risk Factors:Hypertension and ETOH Abuse. ESRD.  Sonographer:    Raquel Sarna Senior RDCS Referring Phys: [phone] Liborio Negron Torres  Sonographer Comments: Patient short of breath and very mobile throughout exam. IMPRESSIONS  1. Left ventricular ejection fraction, by estimation, is 45 to 50%. The left ventricle has mildly decreased function. The left ventricle demonstrates global hypokinesis. There is moderate left ventricular hypertrophy. Left ventricular diastolic parameters are consistent with Grade III diastolic dysfunction (restrictive). Elevated left ventricular end-diastolic pressure.  2. Right ventricular systolic function is normal. The right ventricular size is normal. There is severely elevated pulmonary artery systolic pressure.  3. Left atrial size was severely dilated.  4. Right atrial size was severely dilated.  5. The mitral valve is grossly normal. Mild mitral valve regurgitation. No evidence of mitral stenosis.  6. Tricuspid valve regurgitation is moderate.  7. The aortic valve is tricuspid. Aortic valve regurgitation is not visualized. Mild aortic valve sclerosis is present, with no evidence of aortic valve stenosis.  8. The inferior vena cava is dilated in size with <50% respiratory variability, suggesting right atrial pressure of 15 mmHg. Comparison(s): No prior Echocardiogram. FINDINGS  Left Ventricle: Left ventricular ejection fraction, by estimation, is 45 to 50%. The left ventricle has mildly decreased function. The left ventricle demonstrates global hypokinesis. The left ventricular internal cavity size was normal in size. There is  moderate left ventricular hypertrophy. Left  ventricular diastolic parameters are consistent with Grade III diastolic dysfunction (restrictive). Elevated left ventricular end-diastolic pressure. Right Ventricle: The right ventricular size is normal. No increase in right ventricular wall thickness. Right ventricular systolic function is normal. There is severely elevated pulmonary artery systolic pressure. The tricuspid regurgitant velocity is 3.45 m/s, and with an assumed right atrial pressure of 15 mmHg, the estimated right ventricular systolic pressure is 88.4 mmHg. Left Atrium: Left atrial size was severely dilated. Right Atrium: Right atrial size was severely dilated. Pericardium: Trivial pericardial effusion is present. Mitral Valve: The mitral valve is grossly normal. Moderate mitral annular calcification. Mild mitral valve regurgitation. No evidence of mitral valve stenosis. Tricuspid Valve: The tricuspid valve is normal in structure. Tricuspid valve regurgitation is moderate . No evidence of tricuspid stenosis. Aortic Valve: The aortic valve is tricuspid. . There is mild thickening and mild calcification of the aortic valve. Aortic valve regurgitation is not visualized. Mild aortic valve sclerosis is present, with no evidence of aortic valve stenosis. There is mild thickening of the aortic valve. There is mild calcification of the aortic valve. Pulmonic Valve: The pulmonic valve was grossly normal. Pulmonic valve regurgitation is trivial. No evidence of pulmonic stenosis. Aorta: The aortic root, ascending aorta and aortic arch are all structurally normal, with no evidence of dilitation or obstruction. Pulmonary Artery: The pulmonary artery is of normal size. Venous: The inferior vena cava is dilated in size with less than 50% respiratory variability, suggesting right atrial pressure of 15 mmHg. IAS/Shunts: No atrial level shunt detected by color flow Doppler. Additional Comments: There is a small pleural effusion. Moderate ascites is present.  LEFT  VENTRICLE PLAX 2D LVIDd:         4.63 cm      Diastology LVIDs:         3.18 cm      LV e' lateral:   9.14 cm/s LV PW:  1.28 cm      LV E/e' lateral: 17.5 LV IVS:        1.49 cm      LV e' medial:    6.53 cm/s LVOT diam:     2.10 cm      LV E/e' medial:  24.5 LV SV:         67 LV SV Index:   38 LVOT Area:     3.46 cm  LV Volumes (MOD) LV vol d, MOD A2C: 142.0 ml LV vol d, MOD A4C: 135.0 ml LV vol s, MOD A2C: 78.2 ml LV vol s, MOD A4C: 74.5 ml LV SV MOD A2C:     63.8 ml LV SV MOD A4C:     135.0 ml LV SV MOD BP:      68.1 ml RIGHT VENTRICLE RV S prime:     10.10 cm/s TAPSE (M-mode): 1.8 cm LEFT ATRIUM             Index       RIGHT ATRIUM           Index LA diam:        4.00 cm 2.27 cm/m  RA Area:     24.20 cm LA Vol (A2C):   91.4 ml 51.78 ml/m RA Volume:   81.00 ml  45.89 ml/m LA Vol (A4C):   85.9 ml 48.67 ml/m LA Biplane Vol: 89.0 ml 50.42 ml/m  AORTIC VALVE LVOT Vmax:   107.00 cm/s LVOT Vmean:  68.700 cm/s LVOT VTI:    0.192 m  AORTA Ao Root diam: 3.10 cm Ao Asc diam:  3.60 cm MITRAL VALVE                 TRICUSPID VALVE MV Area (PHT): 4.49 cm      TR Peak grad:   47.6 mmHg MV Decel Time: 169 msec      TR Vmax:        345.00 cm/s MR Peak grad:    121.0 mmHg MR Mean grad:    93.0 mmHg   SHUNTS MR Vmax:         550.00 cm/s Systemic VTI:  0.19 m MR Vmean:        467.0 cm/s  Systemic Diam: 2.10 cm MR PISA:         6.28 cm MR PISA Eff ROA: 46 mm MR PISA Radius:  1.00 cm MV E velocity: 160.00 cm/s MV A velocity: 46.90 cm/s MV E/A ratio:  3.41 Buford Dresser MD Electronically signed by Buford Dresser MD Signature Date/Time: 01/24/2020/3:05:15 PM    Final     Medications:   . amLODipine  10 mg Oral Daily  . calcitRIOL  0.5 mcg Oral Q M,W,F-HD  . calcium acetate  1,334 mg Oral TID WC  . Chlorhexidine Gluconate Cloth  6 each Topical Q0600  . darbepoetin (ARANESP) injection - NON-DIALYSIS  200 mcg Subcutaneous Q Tue-1800  . enoxaparin (LOVENOX) injection  30 mg Subcutaneous Q24H  .  labetalol  100 mg Oral BID  . nicotine  14 mg Transdermal Daily  . sodium chloride flush  3 mL Intravenous Once  . sodium chloride flush  3 mL Intravenous Q12H   Continuous Infusions:   LOS: 0 days   Radene Gunning NP  [facility]   How to contact the [facility] Attending or Consulting provider [place] or covering provider during after hours [place], for this patient?  1. Check the care team in [facility] and  look for a) attending/consulting [facility] provider listed and b) the [facility] team listed 2. Log into [email or web address] and use [place]'s universal password to access. If you do not have the password, please contact the hospital operator. 3. Locate the [facility] provider you are looking for under [facility] and page to a number that you can be directly reached. 4. If you still have difficulty reaching the provider, please page the [facility] (Director on Call) for the Hospitalists listed on amion for assistance.  01/25/2020, 2:09 PM

## 2020-01-25 NOTE — Progress Notes (Signed)
[name] Progress Note   Subjective:   Patient seen and examined at bedside during dialysis.  Remains lethargic.  Briefly awakens to touch and goes back to sleep.    Objective Vitals:   01/25/20 0900 01/25/20 0930 01/25/20 1000 01/25/20 1030  BP: 125/60 (!) 145/92 136/80 (!) 119/57  Pulse: 83 89 85 87  Resp: 17 16 16 17   Temp:      TempSrc:      SpO2:      Weight:      Height:       Physical Exam General:NAD, WDWN Heart:RRR Lungs:+wheezing throughout Abdomen:soft, NTND GU: +edema in scrotum and penis Extremities:trace edema LE Dialysis Access: LU AVF+b   Filed Weights   01/24/20 0338 01/25/20 0500 01/25/20 0757  Weight: 71.2 kg 78.2 kg 78.8 kg    Intake/Output Summary (Last 24 hours) at 01/25/2020 1115 Last data filed at 01/24/2020 1828 Gross per 24 hour  Intake 417 ml  Output 3000 ml  Net -2583 ml    Additional Objective Labs: Basic Metabolic Panel: Recent Labs  Lab 01/24/20 0147 01/24/20 1930 01/25/20 0500  NA 141  --  139  K 6.3* 3.9 4.7  CL 101  --  101  CO2 21*  --  24  GLUCOSE 100*  --  134*  BUN 114*  --  60*  CREATININE 20.08*  --  13.08*  CALCIUM 9.5  --  8.8*   Liver Function Tests: Recent Labs  Lab 01/25/20 0500  AST 14*  ALT 13  ALKPHOS 91  BILITOT 1.3*  PROT 6.3*  ALBUMIN 2.8*   CBC: Recent Labs  Lab 01/24/20 0147 01/25/20 0500  WBC 8.5 7.6  HGB 8.7* 8.2*  HCT 28.7* 26.8*  MCV 112.5* 108.5*  PLT 181 159   Blood Culture    Component Value Date/Time   SDES BLOOD RIGHT FOREARM 11/12/2019 0037   SDES BLOOD RIGHT HAND 11/12/2019 0037   SPECREQUEST Blood Culture adequate volume 11/12/2019 0037   SPECREQUEST Blood Culture adequate volume 11/12/2019 0037   CULT  11/12/2019 0037    NO GROWTH 5 DAYS Performed at [facility], [address] [place], [place] [address]    CULT  11/12/2019 0037    NO GROWTH 5 DAYS Performed at [facility], [address] [place], [place] [address]    REPTSTATUS 11/17/2019 FINAL 11/12/2019 0037   REPTSTATUS 11/17/2019 FINAL 11/12/2019 0037    CBG: Recent Labs  Lab 01/24/20 0452 01/25/20 0545  GLUCAP 143* 72   Studies/Results: DG Chest 2 View  Result Date: 01/24/2020 CLINICAL DATA:  54 year old male with shortness of breath. EXAM: CHEST - 2 VIEW COMPARISON:  Chest radiograph dated 12/09/2019. FINDINGS: There is cardiomegaly with vascular congestion and probable mild interstitial edema. Pneumonia is not excluded. Clinical correlation is recommended. No focal consolidation, pleural effusion, pneumothorax. Atherosclerotic calcification of the aorta. No acute osseous pathology. IMPRESSION: Cardiomegaly with vascular congestion and probable mild interstitial edema. Pneumonia is not excluded. Electronically Signed   By: Anner Crete M.D.   On: 01/24/2020 01:59   ECHOCARDIOGRAM COMPLETE  Result Date: 01/24/2020    ECHOCARDIOGRAM REPORT   Patient Name:   Jordan [name] Date of Exam: 01/24/2020 Medical Rec #:  [id number]        Height:       64.0 in Accession #:    [id number]       Weight:       157.0 lb Date of Birth:  [birth year]  BSA:          1.765 m Patient Age:    54 years         BP:           187/113 mmHg Patient Gender: M                HR:           99 bpm. Exam Location:  Inpatient Procedure: 2D Echo, Color Doppler and Cardiac Doppler Indications:    R55 Syncope  History:        Patient has no prior history of Echocardiogram examinations.                 Risk Factors:Hypertension and ETOH Abuse. ESRD.  Sonographer:    Raquel Sarna Senior RDCS Referring Phys: [phone] [place]  Sonographer Comments: Patient short of breath and very mobile throughout exam. IMPRESSIONS  1. Left ventricular ejection fraction, by estimation, is 45 to 50%. The left ventricle has mildly decreased function. The left ventricle demonstrates global hypokinesis. There is moderate left ventricular hypertrophy. Left ventricular diastolic parameters are consistent  with Grade III diastolic dysfunction (restrictive). Elevated left ventricular end-diastolic pressure.  2. Right ventricular systolic function is normal. The right ventricular size is normal. There is severely elevated pulmonary artery systolic pressure.  3. Left atrial size was severely dilated.  4. Right atrial size was severely dilated.  5. The mitral valve is grossly normal. Mild mitral valve regurgitation. No evidence of mitral stenosis.  6. Tricuspid valve regurgitation is moderate.  7. The aortic valve is tricuspid. Aortic valve regurgitation is not visualized. Mild aortic valve sclerosis is present, with no evidence of aortic valve stenosis.  8. The inferior vena cava is dilated in size with <50% respiratory variability, suggesting right atrial pressure of 15 mmHg. Comparison(s): No prior Echocardiogram. FINDINGS  Left Ventricle: Left ventricular ejection fraction, by estimation, is 45 to 50%. The left ventricle has mildly decreased function. The left ventricle demonstrates global hypokinesis. The left ventricular internal cavity size was normal in size. There is  moderate left ventricular hypertrophy. Left ventricular diastolic parameters are consistent with Grade III diastolic dysfunction (restrictive). Elevated left ventricular end-diastolic pressure. Right Ventricle: The right ventricular size is normal. No increase in right ventricular wall thickness. Right ventricular systolic function is normal. There is severely elevated pulmonary artery systolic pressure. The tricuspid regurgitant velocity is 3.45 m/s, and with an assumed right atrial pressure of 15 mmHg, the estimated right ventricular systolic pressure is 19.4 mmHg. Left Atrium: Left atrial size was severely dilated. Right Atrium: Right atrial size was severely dilated. Pericardium: Trivial pericardial effusion is present. Mitral Valve: The mitral valve is grossly normal. Moderate mitral annular calcification. Mild mitral valve regurgitation. No  evidence of mitral valve stenosis. Tricuspid Valve: The tricuspid valve is normal in structure. Tricuspid valve regurgitation is moderate . No evidence of tricuspid stenosis. Aortic Valve: The aortic valve is tricuspid. . There is mild thickening and mild calcification of the aortic valve. Aortic valve regurgitation is not visualized. Mild aortic valve sclerosis is present, with no evidence of aortic valve stenosis. There is mild thickening of the aortic valve. There is mild calcification of the aortic valve. Pulmonic Valve: The pulmonic valve was grossly normal. Pulmonic valve regurgitation is trivial. No evidence of pulmonic stenosis. Aorta: The aortic root, ascending aorta and aortic arch are all structurally normal, with no evidence of dilitation or obstruction. Pulmonary Artery: The pulmonary artery is of normal size. Venous:  The inferior vena cava is dilated in size with less than 50% respiratory variability, suggesting right atrial pressure of 15 mmHg. IAS/Shunts: No atrial level shunt detected by color flow Doppler. Additional Comments: There is a small pleural effusion. Moderate ascites is present.  LEFT VENTRICLE PLAX 2D LVIDd:         4.63 cm      Diastology LVIDs:         3.18 cm      LV e' lateral:   9.14 cm/s LV PW:         1.28 cm      LV E/e' lateral: 17.5 LV IVS:        1.49 cm      LV e' medial:    6.53 cm/s LVOT diam:     2.10 cm      LV E/e' medial:  24.5 LV SV:         67 LV SV Index:   38 LVOT Area:     3.46 cm  LV Volumes (MOD) LV vol d, MOD A2C: 142.0 ml LV vol d, MOD A4C: 135.0 ml LV vol s, MOD A2C: 78.2 ml LV vol s, MOD A4C: 74.5 ml LV SV MOD A2C:     63.8 ml LV SV MOD A4C:     135.0 ml LV SV MOD BP:      68.1 ml RIGHT VENTRICLE RV S prime:     10.10 cm/s TAPSE (M-mode): 1.8 cm LEFT ATRIUM             Index       RIGHT ATRIUM           Index LA diam:        4.00 cm 2.27 cm/m  RA Area:     24.20 cm LA Vol (A2C):   91.4 ml 51.78 ml/m RA Volume:   81.00 ml  45.89 ml/m LA Vol (A4C):   85.9  ml 48.67 ml/m LA Biplane Vol: 89.0 ml 50.42 ml/m  AORTIC VALVE LVOT Vmax:   107.00 cm/s LVOT Vmean:  68.700 cm/s LVOT VTI:    0.192 m  AORTA Ao Root diam: 3.10 cm Ao Asc diam:  3.60 cm MITRAL VALVE                 TRICUSPID VALVE MV Area (PHT): 4.49 cm      TR Peak grad:   47.6 mmHg MV Decel Time: 169 msec      TR Vmax:        345.00 cm/s MR Peak grad:    121.0 mmHg MR Mean grad:    93.0 mmHg   SHUNTS MR Vmax:         550.00 cm/s Systemic VTI:  0.19 m MR Vmean:        467.0 cm/s  Systemic Diam: 2.10 cm MR PISA:         6.28 cm MR PISA Eff ROA: 46 mm MR PISA Radius:  1.00 cm MV E velocity: 160.00 cm/s MV A velocity: 46.90 cm/s MV E/A ratio:  3.41 Buford Dresser MD Electronically signed by Buford Dresser MD Signature Date/Time: 01/24/2020/3:05:15 PM    Final     Medications:  . amLODipine  10 mg Oral Daily  . calcitRIOL  0.5 mcg Oral Q M,W,F-HD  . calcium acetate  1,334 mg Oral TID WC  . Chlorhexidine Gluconate Cloth  6 each Topical Q0600  . darbepoetin (ARANESP) injection - NON-DIALYSIS  200 mcg Subcutaneous Q Tue-1800  .  enoxaparin (LOVENOX) injection  30 mg Subcutaneous Q24H  . labetalol  100 mg Oral BID  . nicotine  14 mg Transdermal Daily  . sodium chloride flush  3 mL Intravenous Once  . sodium chloride flush  3 mL Intravenous Q12H    Dialysis Orders: MWF [facility]  4h  450/800  68kg  2/2 bath  LUA AVF  Hep none  - mircera 225 last 4/2  - venofer 50/wk  - sensipar 90 tiw  - calcitriol 1 ug  Assessment/Plan: 1. Volume overload - Some improvement but remains overloaded.  Plan for serial HD, net UF removed yesterday 3L.  Set for additional 3L to be removed today.   2. ESRD - on HD MWF.  Extra treatment yesterday for volume removal.  Plan for serial dialysis.  3. Hyperkalemia - K improved today to 4.7.   3. Anemia of CKD- Hgb 8.2.  Aranesp 248mcg qwk.  4. Secondary hyperparathyroidism - Ca at goal.  Will check phos. Continue binders/VDRA.   5. HTN- BP  improved.  Continue home meds.   6. Nutrition - Renal diet w/fluid restrictions.   Jen Mow, PA-C [facility] Pager: [phone] 01/25/2020,11:15 AM  LOS: 0 days

## 2020-01-25 NOTE — TOC Initial Note (Signed)
Transition of Care [facility]) - Initial/Assessment Note    Patient Details  Name: Jordan [name] MRN: [id number] Date of Birth: 03-06-1966  Transition of Care [facility]) CM/SW Contact:    Marilu Favre, RN Phone Number: 01/25/2020, 12:52 PM  Clinical Narrative:                  Patient confirmed face sheet information. From home alone, does not use any DME. Did not offer any other information. Will follow up after patient works with PT .   Barriers to Discharge: Continued Medical Work up   Patient Goals and CMS Choice        Expected Discharge Plan and Services     Discharge Planning Services: CM Consult   Living arrangements for the past 2 months: [place]                   DME Agency: NA                  Prior Living Arrangements/Services Living arrangements for the past 2 months: Single Family Home Lives with:: Self Patient language and need for interpreter reviewed:: Yes              Criminal Activity/Legal Involvement Pertinent to Current Situation/Hospitalization: No - Comment as needed  Activities of Daily Living      Permission Sought/Granted   Permission granted to share information with : No              Emotional Assessment Appearance:: Appears stated age     Orientation: : Oriented to Self, Oriented to Place, Oriented to  Time, Oriented to Situation Alcohol / Substance Use: Not Applicable Psych Involvement: No (comment)  Admission diagnosis:  Hyperkalemia [E87.5] Syncopal episodes [R55] Acute pulmonary edema (HCC) [J81.0] ESRD (end stage renal disease) ([place]) [N18.6] Syncope, unspecified syncope type [R55] Volume overload [E87.70] Patient Active Problem List   Diagnosis Date Noted  . Syncopal episodes 01/24/2020  . Macrocytic anemia 01/24/2020  . Hyperkalemia 01/24/2020  . Fluid overload 11/12/2019  . Acute respiratory failure with hypoxia ([place]) 11/12/2019  . Aortic atherosclerosis ([place]) 11/12/2019  . Emphysema of  lung ([place]) 11/12/2019  . Acute pulmonary edema ([place]) 10/31/2019  . Acute on chronic congestive heart failure ([place]) 10/22/2019  . Acute respiratory distress 10/06/2019  . Volume overload 10/05/2019  . Hypokalemia 10/05/2019  . ESRD (end stage renal disease) ([place]) 10/01/2019  . Leg mass, right   . Hyperbilirubinemia   . Symptomatic anemia 09/19/2019  . Pruritus 09/19/2019  . Hemoptysis 02/11/2019  . Hypercalcemia 01/19/2019  . Anemia in ESRD (end-stage renal disease) ([place]) 05/17/2018  . Hepatitis B 03/19/2018  . ESRD on dialysis ([place]) 05/26/2017  . Essential hypertension 05/26/2017  . Urinary tract infection, site not specified 10/17/2016  . Coagulation defect, unspecified ([place]) 08/13/2016  . Diarrhea, unspecified 08/13/2016  . Encounter for immunization 08/13/2016  . Headache, unspecified 08/13/2016  . Hypertensive chronic kidney disease with stage 5 chronic kidney disease or end stage renal disease ([place]) 08/13/2016  . Iron deficiency anemia, unspecified 08/13/2016  . Pain, unspecified 08/13/2016  . Moderate protein-calorie malnutrition ([place]) 08/13/2016  . Secondary hyperparathyroidism of renal origin ([place]) 08/13/2016   PCP:  Jearld Fenton, NP Pharmacy:   [facility], [place] - [address] [place] [address] Phone: [phone] Fax: [phone]  [facility] [address] - [place], [place] - [address] [employer], [place]  [place] [address] Phone: [phone] Fax: [phone]  [place], [place] - [place] AT [facility] [address] [place] [address] Phone: [phone] Fax: [phone]     Social Determinants of Health ([place]) Interventions    Readmission Risk Interventions No flowsheet data found.

## 2020-01-26 ENCOUNTER — Encounter (HOSPITAL_COMMUNITY): Payer: Self-pay | Admitting: Internal Medicine

## 2020-01-26 DIAGNOSIS — Z9119 Patient's noncompliance with other medical treatment and regimen: Secondary | ICD-10-CM

## 2020-01-26 DIAGNOSIS — J810 Acute pulmonary edema: Secondary | ICD-10-CM

## 2020-01-26 LAB — GLUCOSE, CAPILLARY: Glucose-Capillary: 83 mg/dL (ref 70–99)

## 2020-01-26 NOTE — Progress Notes (Signed)
Pt refused labs to be drawn at this time, requested lab to come back after 6am.

## 2020-01-26 NOTE — Progress Notes (Signed)
Patient wanting to leave AMA. Dr. Eliseo Squires made aware and paper work signed. IV removed

## 2020-01-26 NOTE — Discharge Summary (Signed)
Physician Discharge Summary  MCKOY BHAKTA XFG:[id number] DOB: 03/05/1966 DOA: 01/24/2020  PCP: Jearld Fenton, NP  Admit date: 01/24/2020 Discharge date: 01/26/2020  Admitted From: home Discharge disposition: PATIENT LEFT AMA BEFORE BEING SEEN   Recommendations for Outpatient Follow-Up:     Discharge Diagnosis:   Principal Problem:   Syncopal episodes Active Problems:   ESRD on dialysis ([facility])   Hyperkalemia   Essential hypertension   Macrocytic anemia    Discharge Condition: PATIENT LEFT AMA  Diet recommendation: Low sodium, heart healthy.    Wound care: None.  Code status: Full.   History of Present Illness:   Jordan [name] is a 54 y.o. male with medical history significant of anemia, aortic stenosis, stage V CKD, ED, emphysema lung, active smoker, history of EtOH abuse, history of blood transfusion, hypertension admitted 5/4 to the emergency department due to bilateral lower extremity and bilateral testicular edema since day prior.  He had been mildly dyspneic.  He denied sodium or high fluid intake.  He reported missing dialysis on Friday and Monday.  He also stated that he passed out for 5 times while trying to have a bowel movement at home.  He denied chest pain, palpitations, nausea, emesis, diaphoresis, fever, chills, sore throat, wheezing or hemoptysis.  He had been constipated, denied abdominal pain, diarrhea, melena or hematochezia.  He rarely urinates.  No dysuria, frequency or nocturia.  ED Course: Initial vital signs temperature 98.1 F, pulse 101, respiration 18, blood pressure 166/101 mmHg and O2 sat 86% on room air.  Patient received supplemental oxygen and nephrology was contacted.  Nephrology did not suggest any treatment for hyperkalemia, but they are arranging for HD in the next 2 hours.  CBC showed a white count of 8.5, hemoglobin 8.7 g/dL and platelets 181.  Sodium 141, potassium 6.3, chloride 101 and CO2 21 mmol/L.  Glucose 100, BUN 114,  creatinine 20.08 and calcium 9.5 mg/dL.  His chest radiograph showed cardiomegaly with vascular congestion and probably mild interstitial edema.    Hospital Course by Problem:   PATIENT LEFT AMA 01/26/20  #1.  Syncopal episodes.  Likely vasovagal in the setting of missed dialysis.  Reportedly he had 5 events prior to coming to the hospital while he was trying to have bowel movements.  Echo reveals an EF of 45 to 50% with grade 3 diastolic dysfunction and global hypokinesis as well as moderate left ventricular hypertrophy. no Events on telemetry. Dialysis for metobolic derangements. No s/sx infection  #2.  End-stage renal disease on dialysis/hyperkalemia/volume overload.  Patient is a Monday Wednesday Friday dialysis patient and he missed 2 consecutive sessions.  Unable to articulate why he missed those sessions.  He was evaluated by nephrology who opined volume overload with anasarca/body edema due to multiple missed hemodialysis sessions plus or minus fluid indiscretion and recommended emergent dialysis.  He underwent dialysis x2 and then left ama.  #3.  Hypertension.  Fair control.  No antihypertensive meds on his home medication list.  Norvasc and beta-blocker added. He left ama  #4.  Anemia of chronic disease.  Stable at baseline  #5.  History of aortic stenosis.  Echocardiogram as noted above.  #6.  Emphysema/tobacco use.  Not on home oxygen.  Oxygen saturation level greater than 90% #7.  History of EtOH abuse.  Denies any recent drinking -monitor   Medical Consultants:   nephrology   Discharge Exam:   Vitals:   01/25/20 2350 01/26/20 0519  BP: Marland Kitchen)  141/85 131/84  Pulse: 97 89  Resp: 17 17  Temp: 98.4 F (36.9 C) 98.5 F (36.9 C)  SpO2: 95% 98%   Vitals:   01/25/20 1728 01/25/20 2130 01/25/20 2350 01/26/20 0519  BP: 132/74 140/83 (!) 141/85 131/84  Pulse: 93 96 97 89  Resp: 18 16 17 17   Temp: 97.9 F (36.6 C) 98.5 F (36.9 C) 98.4 F (36.9 C) 98.5 F (36.9 C)   TempSrc:  Oral Oral Oral  SpO2: (!) 88% 93% 95% 98%  Weight:    74.7 kg  Height:        PE: LEFT AMA before physical exam on 01/26/20  The results of significant diagnostics from this hospitalization (including imaging, microbiology, ancillary and laboratory) are listed below for reference.     Procedures and Diagnostic Studies:   DG Chest 2 View  Result Date: 01/24/2020 CLINICAL DATA:  54 year old male with shortness of breath. EXAM: CHEST - 2 VIEW COMPARISON:  Chest radiograph dated 12/09/2019. FINDINGS: There is cardiomegaly with vascular congestion and probable mild interstitial edema. Pneumonia is not excluded. Clinical correlation is recommended. No focal consolidation, pleural effusion, pneumothorax. Atherosclerotic calcification of the aorta. No acute osseous pathology. IMPRESSION: Cardiomegaly with vascular congestion and probable mild interstitial edema. Pneumonia is not excluded. Electronically Signed   By: Anner Crete M.D.   On: 01/24/2020 01:59   ECHOCARDIOGRAM COMPLETE  Result Date: 01/24/2020    ECHOCARDIOGRAM REPORT   Patient Name:   Jordan [name] Date of Exam: 01/24/2020 Medical Rec #:  [id number]        Height:       64.0 in Accession #:    [id number]       Weight:       157.0 lb Date of Birth:  1966-05-20        BSA:          1.765 m Patient Age:    54 years         BP:           187/113 mmHg Patient Gender: M                HR:           99 bpm. Exam Location:  Inpatient Procedure: 2D Echo, Color Doppler and Cardiac Doppler Indications:    R55 Syncope  History:        Patient has no prior history of Echocardiogram examinations.                 Risk Factors:Hypertension and ETOH Abuse. ESRD.  Sonographer:    Raquel Sarna Senior RDCS Referring Phys: [id number] [place]  Sonographer Comments: Patient short of breath and very mobile throughout exam. IMPRESSIONS  1. Left ventricular ejection fraction, by estimation, is 45 to 50%. The left ventricle has mildly decreased function.  The left ventricle demonstrates global hypokinesis. There is moderate left ventricular hypertrophy. Left ventricular diastolic parameters are consistent with Grade III diastolic dysfunction (restrictive). Elevated left ventricular end-diastolic pressure.  2. Right ventricular systolic function is normal. The right ventricular size is normal. There is severely elevated pulmonary artery systolic pressure.  3. Left atrial size was severely dilated.  4. Right atrial size was severely dilated.  5. The mitral valve is grossly normal. Mild mitral valve regurgitation. No evidence of mitral stenosis.  6. Tricuspid valve regurgitation is moderate.  7. The aortic valve is tricuspid. Aortic valve regurgitation is not visualized. Mild aortic valve sclerosis is present, with no  evidence of aortic valve stenosis.  8. The inferior vena cava is dilated in size with <50% respiratory variability, suggesting right atrial pressure of 15 mmHg. Comparison(s): No prior Echocardiogram. FINDINGS  Left Ventricle: Left ventricular ejection fraction, by estimation, is 45 to 50%. The left ventricle has mildly decreased function. The left ventricle demonstrates global hypokinesis. The left ventricular internal cavity size was normal in size. There is  moderate left ventricular hypertrophy. Left ventricular diastolic parameters are consistent with Grade III diastolic dysfunction (restrictive). Elevated left ventricular end-diastolic pressure. Right Ventricle: The right ventricular size is normal. No increase in right ventricular wall thickness. Right ventricular systolic function is normal. There is severely elevated pulmonary artery systolic pressure. The tricuspid regurgitant velocity is 3.45 m/s, and with an assumed right atrial pressure of 15 mmHg, the estimated right ventricular systolic pressure is 38.7 mmHg. Left Atrium: Left atrial size was severely dilated. Right Atrium: Right atrial size was severely dilated. Pericardium: Trivial  pericardial effusion is present. Mitral Valve: The mitral valve is grossly normal. Moderate mitral annular calcification. Mild mitral valve regurgitation. No evidence of mitral valve stenosis. Tricuspid Valve: The tricuspid valve is normal in structure. Tricuspid valve regurgitation is moderate . No evidence of tricuspid stenosis. Aortic Valve: The aortic valve is tricuspid. . There is mild thickening and mild calcification of the aortic valve. Aortic valve regurgitation is not visualized. Mild aortic valve sclerosis is present, with no evidence of aortic valve stenosis. There is mild thickening of the aortic valve. There is mild calcification of the aortic valve. Pulmonic Valve: The pulmonic valve was grossly normal. Pulmonic valve regurgitation is trivial. No evidence of pulmonic stenosis. Aorta: The aortic root, ascending aorta and aortic arch are all structurally normal, with no evidence of dilitation or obstruction. Pulmonary Artery: The pulmonary artery is of normal size. Venous: The inferior vena cava is dilated in size with less than 50% respiratory variability, suggesting right atrial pressure of 15 mmHg. IAS/Shunts: No atrial level shunt detected by color flow Doppler. Additional Comments: There is a small pleural effusion. Moderate ascites is present.  LEFT VENTRICLE PLAX 2D LVIDd:         4.63 cm      Diastology LVIDs:         3.18 cm      LV e' lateral:   9.14 cm/s LV PW:         1.28 cm      LV E/e' lateral: 17.5 LV IVS:        1.49 cm      LV e' medial:    6.53 cm/s LVOT diam:     2.10 cm      LV E/e' medial:  24.5 LV SV:         67 LV SV Index:   38 LVOT Area:     3.46 cm  LV Volumes (MOD) LV vol d, MOD A2C: 142.0 ml LV vol d, MOD A4C: 135.0 ml LV vol s, MOD A2C: 78.2 ml LV vol s, MOD A4C: 74.5 ml LV SV MOD A2C:     63.8 ml LV SV MOD A4C:     135.0 ml LV SV MOD BP:      68.1 ml RIGHT VENTRICLE RV S prime:     10.10 cm/s TAPSE (M-mode): 1.8 cm LEFT ATRIUM             Index       RIGHT ATRIUM  Index LA diam:        4.00 cm 2.27 cm/m  RA Area:     24.20 cm LA Vol (A2C):   91.4 ml 51.78 ml/m RA Volume:   81.00 ml  45.89 ml/m LA Vol (A4C):   85.9 ml 48.67 ml/m LA Biplane Vol: 89.0 ml 50.42 ml/m  AORTIC VALVE LVOT Vmax:   107.00 cm/s LVOT Vmean:  68.700 cm/s LVOT VTI:    0.192 m  AORTA Ao Root diam: 3.10 cm Ao Asc diam:  3.60 cm MITRAL VALVE                 TRICUSPID VALVE MV Area (PHT): 4.49 cm      TR Peak grad:   47.6 mmHg MV Decel Time: 169 msec      TR Vmax:        345.00 cm/s MR Peak grad:    121.0 mmHg MR Mean grad:    93.0 mmHg   SHUNTS MR Vmax:         550.00 cm/s Systemic VTI:  0.19 m MR Vmean:        467.0 cm/s  Systemic Diam: 2.10 cm MR PISA:         6.28 cm MR PISA Eff ROA: 46 mm MR PISA Radius:  1.00 cm MV E velocity: 160.00 cm/s MV A velocity: 46.90 cm/s MV E/A ratio:  3.41 Buford Dresser MD Electronically signed by Buford Dresser MD Signature Date/Time: 01/24/2020/3:05:15 PM    Final      Labs:   Basic Metabolic Panel: Recent Labs  Lab 01/24/20 0147 01/24/20 0147 01/24/20 1930 01/25/20 0500  NA 141  --   --  139  K 6.3*   < > 3.9 4.7  CL 101  --   --  101  CO2 21*  --   --  24  GLUCOSE 100*  --   --  134*  BUN 114*  --   --  60*  CREATININE 20.08*  --   --  13.08*  CALCIUM 9.5  --   --  8.8*   < > = values in this interval not displayed.   GFR Estimated Creatinine Clearance: 6.1 mL/min (A) (by C-G formula based on SCr of 13.08 mg/dL (H)). Liver Function Tests: Recent Labs  Lab 01/25/20 0500  AST 14*  ALT 13  ALKPHOS 91  BILITOT 1.3*  PROT 6.3*  ALBUMIN 2.8*   No results for input(s): LIPASE, AMYLASE in the last 168 hours. No results for input(s): AMMONIA in the last 168 hours. Coagulation profile No results for input(s): INR, PROTIME in the last 168 hours.  CBC: Recent Labs  Lab 01/24/20 0147 01/25/20 0500  WBC 8.5 7.6  HGB 8.7* 8.2*  HCT 28.7* 26.8*  MCV 112.5* 108.5*  PLT 181 159   Cardiac Enzymes: No results for  input(s): CKTOTAL, CKMB, CKMBINDEX, TROPONINI in the last 168 hours. BNP: Invalid input(s): POCBNP CBG: Recent Labs  Lab 01/24/20 0452 01/25/20 0545 01/26/20 0609  GLUCAP 143* 72 83   D-Dimer No results for input(s): DDIMER in the last 72 hours. Hgb A1c No results for input(s): HGBA1C in the last 72 hours. Lipid Profile No results for input(s): CHOL, HDL, LDLCALC, TRIG, CHOLHDL, LDLDIRECT in the last 72 hours. Thyroid function studies No results for input(s): TSH, T4TOTAL, T3FREE, THYROIDAB in the last 72 hours.  Invalid input(s): FREET3 Anemia work up No results for input(s): VITAMINB12, FOLATE, FERRITIN, TIBC, IRON, RETICCTPCT in the last 72 hours.  Microbiology Recent Results (from the past 240 hour(s))  Respiratory Panel by RT PCR (Flu A&B, Covid) - Nasopharyngeal Swab     Status: None   Collection Time: 01/24/20  3:52 AM   Specimen: Nasopharyngeal Swab  Result Value Ref Range Status   SARS Coronavirus 2 by RT PCR NEGATIVE NEGATIVE Final    Comment: (NOTE) SARS-CoV-2 target nucleic acids are NOT DETECTED. The SARS-CoV-2 RNA is generally detectable in upper respiratoy specimens during the acute phase of infection. The lowest concentration of SARS-CoV-2 viral copies this assay can detect is 131 copies/mL. A negative result does not preclude SARS-Cov-2 infection and should not be used as the sole basis for treatment or other patient management decisions. A negative result may occur with  improper specimen collection/handling, submission of specimen other than nasopharyngeal swab, presence of viral mutation(s) within the areas targeted by this assay, and inadequate number of viral copies (<131 copies/mL). A negative result must be combined with clinical observations, patient history, and epidemiological information. The expected result is Negative. Fact Sheet for Patients:  PinkCheek.be Fact Sheet for Healthcare Providers:   GravelBags.it This test is not yet ap proved or cleared by the [place] FDA and  has been authorized for detection and/or diagnosis of SARS-CoV-2 by FDA under an Emergency Use Authorization (EUA). This EUA will remain  in effect (meaning this test can be used) for the duration of the COVID-19 declaration under Section 564(b)(1) of the Act, 21 U.S.C. section 360bbb-3(b)(1), unless the authorization is terminated or revoked sooner.    Influenza A by PCR NEGATIVE NEGATIVE Final   Influenza B by PCR NEGATIVE NEGATIVE Final    Comment: (NOTE) The Xpert Xpress SARS-CoV-2/FLU/RSV assay is intended as an aid in  the diagnosis of influenza from Nasopharyngeal swab specimens and  should not be used as a sole basis for treatment. Nasal washings and  aspirates are unacceptable for Xpert Xpress SARS-CoV-2/FLU/RSV  testing. Fact Sheet for Patients: PinkCheek.be Fact Sheet for Healthcare Providers: GravelBags.it This test is not yet approved or cleared by the [place] FDA and  has been authorized for detection and/or diagnosis of SARS-CoV-2 by  FDA under an Emergency Use Authorization (EUA). This EUA will remain  in effect (meaning this test can be used) for the duration of the  Covid-19 declaration under Section 564(b)(1) of the Act, 21  U.S.C. section 360bbb-3(b)(1), unless the authorization is  terminated or revoked. Performed at [facility], [place] [address] [place],  [address]      Discharge Instructions:        Time coordinating discharge: 20 minutes  Signed:  Radene Gunning NP  [facility] 01/26/2020, 9:46 AM

## 2020-01-27 ENCOUNTER — Telehealth: Payer: Self-pay | Admitting: Nurse Practitioner

## 2020-01-27 DIAGNOSIS — Z992 Dependence on renal dialysis: Secondary | ICD-10-CM | POA: Diagnosis not present

## 2020-01-27 DIAGNOSIS — D509 Iron deficiency anemia, unspecified: Secondary | ICD-10-CM | POA: Diagnosis not present

## 2020-01-27 DIAGNOSIS — D631 Anemia in chronic kidney disease: Secondary | ICD-10-CM | POA: Diagnosis not present

## 2020-01-27 DIAGNOSIS — N2581 Secondary hyperparathyroidism of renal origin: Secondary | ICD-10-CM | POA: Diagnosis not present

## 2020-01-27 DIAGNOSIS — N186 End stage renal disease: Secondary | ICD-10-CM | POA: Diagnosis not present

## 2020-01-27 NOTE — Telephone Encounter (Signed)
Transition of care contact from inpatient facility  Date of Discharge: 01/26/2020 left AMA Date of Contact: 01/27/2020 Method of contact: Phone  Attempted to contact patient to discuss transition of care from inpatient admission. Patient did not answer the phone. Has no voicemail. Unable to leave message.

## 2020-01-29 ENCOUNTER — Telehealth: Payer: Self-pay | Admitting: Nephrology

## 2020-01-29 NOTE — Telephone Encounter (Signed)
Transition of Care Contact from [facility]  Date of Discharge: 01/27/20 Date of Contact: 01/29/20 Method of contact: phone  Attempted to contact patient to discuss transition of care from inpatient admission. Patient did not answer the phone. Has no voicemail. Unable to leave message.  Jen Mow, PA-C [facility] Pager: [phone]

## 2020-01-31 ENCOUNTER — Other Ambulatory Visit: Payer: Self-pay

## 2020-01-31 ENCOUNTER — Inpatient Hospital Stay
Admission: EM | Admit: 2020-01-31 | Discharge: 2020-02-01 | DRG: 640 | Discharge status: Against Medical Advice | Payer: Medicare Other | Attending: Internal Medicine | Admitting: Internal Medicine

## 2020-01-31 ENCOUNTER — Encounter: Payer: Self-pay | Admitting: Emergency Medicine

## 2020-01-31 ENCOUNTER — Emergency Department: Payer: Medicare Other

## 2020-01-31 DIAGNOSIS — J9621 Acute and chronic respiratory failure with hypoxia: Secondary | ICD-10-CM | POA: Diagnosis present

## 2020-01-31 DIAGNOSIS — J449 Chronic obstructive pulmonary disease, unspecified: Secondary | ICD-10-CM | POA: Diagnosis not present

## 2020-01-31 DIAGNOSIS — I10 Essential (primary) hypertension: Secondary | ICD-10-CM

## 2020-01-31 DIAGNOSIS — I160 Hypertensive urgency: Secondary | ICD-10-CM | POA: Diagnosis not present

## 2020-01-31 DIAGNOSIS — Z20822 Contact with and (suspected) exposure to covid-19: Secondary | ICD-10-CM | POA: Diagnosis present

## 2020-01-31 DIAGNOSIS — Z992 Dependence on renal dialysis: Secondary | ICD-10-CM | POA: Diagnosis not present

## 2020-01-31 DIAGNOSIS — N186 End stage renal disease: Secondary | ICD-10-CM

## 2020-01-31 DIAGNOSIS — E8770 Fluid overload, unspecified: Secondary | ICD-10-CM | POA: Diagnosis not present

## 2020-01-31 DIAGNOSIS — Z743 Need for continuous supervision: Secondary | ICD-10-CM | POA: Diagnosis not present

## 2020-01-31 DIAGNOSIS — N2581 Secondary hyperparathyroidism of renal origin: Secondary | ICD-10-CM | POA: Diagnosis present

## 2020-01-31 DIAGNOSIS — Z833 Family history of diabetes mellitus: Secondary | ICD-10-CM

## 2020-01-31 DIAGNOSIS — N50819 Testicular pain, unspecified: Secondary | ICD-10-CM | POA: Diagnosis present

## 2020-01-31 DIAGNOSIS — J9601 Acute respiratory failure with hypoxia: Secondary | ICD-10-CM | POA: Diagnosis present

## 2020-01-31 DIAGNOSIS — I120 Hypertensive chronic kidney disease with stage 5 chronic kidney disease or end stage renal disease: Secondary | ICD-10-CM | POA: Diagnosis present

## 2020-01-31 DIAGNOSIS — J810 Acute pulmonary edema: Secondary | ICD-10-CM | POA: Diagnosis not present

## 2020-01-31 DIAGNOSIS — F1010 Alcohol abuse, uncomplicated: Secondary | ICD-10-CM | POA: Diagnosis present

## 2020-01-31 DIAGNOSIS — Z9115 Patient's noncompliance with renal dialysis: Secondary | ICD-10-CM

## 2020-01-31 DIAGNOSIS — E785 Hyperlipidemia, unspecified: Secondary | ICD-10-CM | POA: Diagnosis present

## 2020-01-31 DIAGNOSIS — Z9114 Patient's other noncompliance with medication regimen: Secondary | ICD-10-CM

## 2020-01-31 DIAGNOSIS — R0689 Other abnormalities of breathing: Secondary | ICD-10-CM | POA: Diagnosis not present

## 2020-01-31 DIAGNOSIS — Z79899 Other long term (current) drug therapy: Secondary | ICD-10-CM | POA: Diagnosis not present

## 2020-01-31 DIAGNOSIS — Z5329 Procedure and treatment not carried out because of patient's decision for other reasons: Secondary | ICD-10-CM | POA: Diagnosis not present

## 2020-01-31 DIAGNOSIS — R0602 Shortness of breath: Secondary | ICD-10-CM | POA: Diagnosis not present

## 2020-01-31 DIAGNOSIS — Z841 Family history of disorders of kidney and ureter: Secondary | ICD-10-CM | POA: Diagnosis not present

## 2020-01-31 DIAGNOSIS — R531 Weakness: Secondary | ICD-10-CM | POA: Diagnosis not present

## 2020-01-31 DIAGNOSIS — R52 Pain, unspecified: Secondary | ICD-10-CM | POA: Diagnosis not present

## 2020-01-31 DIAGNOSIS — J811 Chronic pulmonary edema: Secondary | ICD-10-CM | POA: Diagnosis present

## 2020-01-31 DIAGNOSIS — G4489 Other headache syndrome: Secondary | ICD-10-CM | POA: Diagnosis not present

## 2020-01-31 DIAGNOSIS — D631 Anemia in chronic kidney disease: Secondary | ICD-10-CM | POA: Diagnosis not present

## 2020-01-31 LAB — CBC WITH DIFFERENTIAL/PLATELET
Abs Immature Granulocytes: 0.05 K/uL (ref 0.00–0.07)
Basophils Absolute: 0 K/uL (ref 0.0–0.1)
Basophils Relative: 1 %
Eosinophils Absolute: 0.5 K/uL (ref 0.0–0.5)
Eosinophils Relative: 6 %
HCT: 30.2 % — ABNORMAL LOW (ref 39.0–52.0)
Hemoglobin: 9 g/dL — ABNORMAL LOW (ref 13.0–17.0)
Immature Granulocytes: 1 %
Lymphocytes Relative: 11 %
Lymphs Abs: 0.9 K/uL (ref 0.7–4.0)
MCH: 32.5 pg (ref 26.0–34.0)
MCHC: 29.8 g/dL — ABNORMAL LOW (ref 30.0–36.0)
MCV: 109 fL — ABNORMAL HIGH (ref 80.0–100.0)
Monocytes Absolute: 0.7 K/uL (ref 0.1–1.0)
Monocytes Relative: 9 %
Neutro Abs: 6 K/uL (ref 1.7–7.7)
Neutrophils Relative %: 72 %
Platelets: 144 K/uL — ABNORMAL LOW (ref 150–400)
RBC: 2.77 MIL/uL — ABNORMAL LOW (ref 4.22–5.81)
RDW: 16.2 % — ABNORMAL HIGH (ref 11.5–15.5)
WBC: 8.2 K/uL (ref 4.0–10.5)
nRBC: 0 % (ref 0.0–0.2)

## 2020-01-31 LAB — BASIC METABOLIC PANEL WITH GFR
Anion gap: 18 — ABNORMAL HIGH (ref 5–15)
BUN: 66 mg/dL — ABNORMAL HIGH (ref 6–20)
CO2: 25 mmol/L (ref 22–32)
Calcium: 9.1 mg/dL (ref 8.9–10.3)
Chloride: 101 mmol/L (ref 98–111)
Creatinine, Ser: 14.2 mg/dL — ABNORMAL HIGH (ref 0.61–1.24)
GFR calc Af Amer: 4 mL/min — ABNORMAL LOW
GFR calc non Af Amer: 3 mL/min — ABNORMAL LOW
Glucose, Bld: 79 mg/dL (ref 70–99)
Potassium: 4.7 mmol/L (ref 3.5–5.1)
Sodium: 144 mmol/L (ref 135–145)

## 2020-01-31 LAB — HEPATIC FUNCTION PANEL
ALT: 14 U/L (ref 0–44)
AST: 26 U/L (ref 15–41)
Albumin: 3.9 g/dL (ref 3.5–5.0)
Alkaline Phosphatase: 106 U/L (ref 38–126)
Bilirubin, Direct: 0.4 mg/dL — ABNORMAL HIGH (ref 0.0–0.2)
Indirect Bilirubin: 1.3 mg/dL — ABNORMAL HIGH (ref 0.3–0.9)
Total Bilirubin: 1.7 mg/dL — ABNORMAL HIGH (ref 0.3–1.2)
Total Protein: 7.9 g/dL (ref 6.5–8.1)

## 2020-01-31 IMAGING — DX DG CHEST 1V PORT
1 series · 1 of 1 positions shown · non-contrast
Comparison: [DATE]

CLINICAL DATA: Shortness of breath

EXAM:
PORTABLE CHEST 1 VIEW

[chest ap]
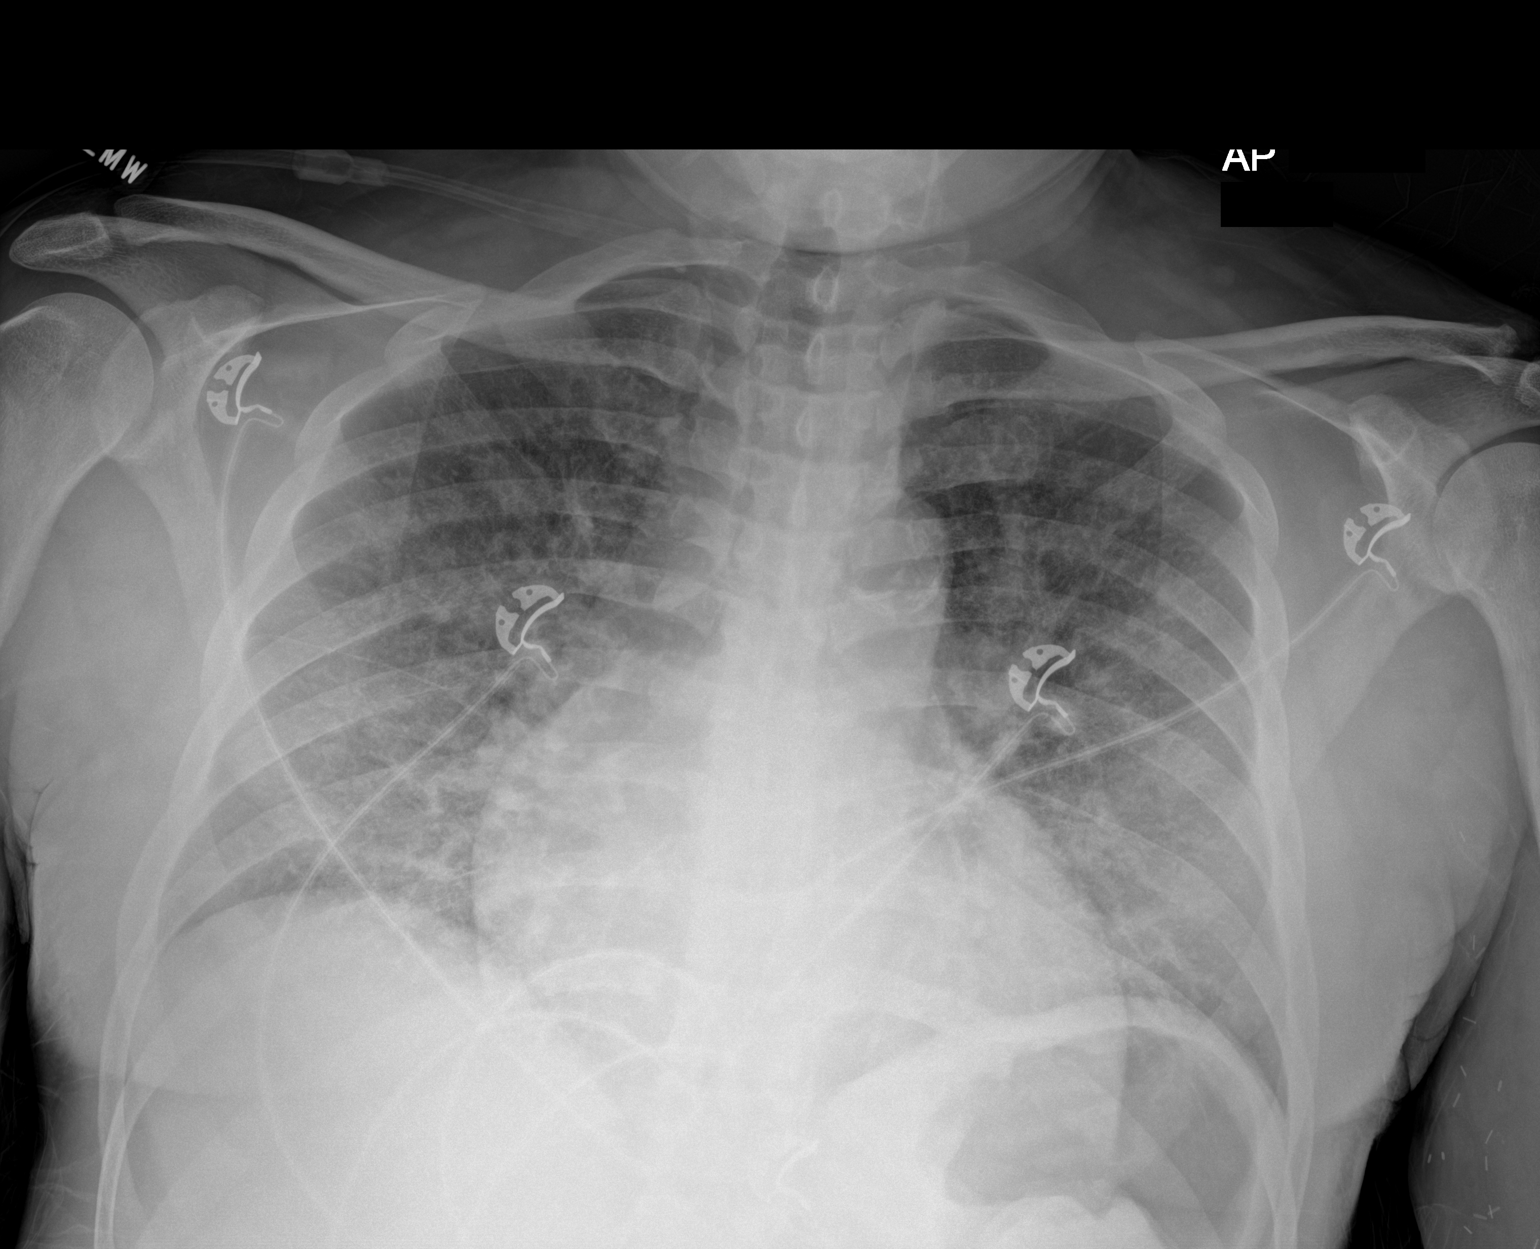

[1 of 1 positions shown; findings below may reference images not displayed]

FINDINGS: There is unchanged mild cardiomegaly. There is prominence of the
central pulmonary vasculature with mildly increased left airspace
opacities seen throughout both, increased from the prior exam. No
pleural effusion. No acute osseous abnormality.
IMPRESSION: Cardiomegaly and increased diffuse bilateral airspace opacities,
likely pulmonary edema, however infectious etiology.

## 2020-01-31 MED ORDER — NITROGLYCERIN 2 % TD OINT
1.0000 [in_us] | TOPICAL_OINTMENT | Freq: Once | TRANSDERMAL | Status: AC
  Administered 2020-01-31: 1 [in_us] via TOPICAL
  Filled 2020-01-31: qty 1

## 2020-01-31 MED ORDER — LABETALOL HCL 5 MG/ML IV SOLN: 10.0000 mg | Freq: Once | INTRAVENOUS | Status: DC

## 2020-01-31 MED ORDER — IPRATROPIUM-ALBUTEROL 0.5-2.5 (3) MG/3ML IN SOLN
3.0000 mL | Freq: Once | RESPIRATORY_TRACT | Status: AC
  Administered 2020-01-31: 3 mL via RESPIRATORY_TRACT
  Filled 2020-01-31: qty 3

## 2020-01-31 MED ORDER — LABETALOL HCL 5 MG/ML IV SOLN
20.0000 mg | Freq: Once | INTRAVENOUS | Status: AC
  Administered 2020-01-31: 20 mg via INTRAVENOUS
  Filled 2020-01-31: qty 4

## 2020-01-31 NOTE — ED Provider Notes (Signed)
[facility] Emergency Department Provider Note    First MD Initiated Contact with Patient 01/31/20 2122     (approximate)  I have reviewed the triage vital signs and the nursing notes.   HISTORY  Chief Complaint Testicle Pain and Shortness of Breath    HPI Jordan [name] is a 54 y.o. male with the below listed past medical history dialysis dependent presents the ER for worsening swelling as well as shortness of breath.  Patient missed dialysis yesterday due to a court appointment.  Patient was just recently admitted for similar swelling but is also having multiple syncopal episodes and has been missing dialysis more frequently.  Left AMA during previous admission.    Past Medical History:  Diagnosis Date  . Anemia   . Aortic atherosclerosis ([place]) 11/12/2019  . CKD (chronic kidney disease)    Stage 5  Dialysis - M/W/F in [place], [place]  . Dyspnea    tx with inhaler when sick  . ED (erectile dysfunction)   . Emphysema of lung ([place]) 11/12/2019  . ETOH abuse   . History of blood transfusion   . Hypertension   . Wears dentures    Family History  Problem Relation Age of Onset  . Diabetes Mother   . Kidney failure Mother   . Healthy Father   . Kidney failure Brother   . Healthy Sister   . Kidney disease Daughter   . Post-traumatic stress disorder Neg Hx   . Bladder Cancer Neg Hx   . Kidney cancer Neg Hx    Past Surgical History:  Procedure Laterality Date  . BASCILIC VEIN TRANSPOSITION Left 08/07/2016   Procedure: LEFT BASILIC VEIN TRANSPOSITION;  Surgeon: Angelia Mould, MD;  Location: [place];  Service: Vascular;  Laterality: Left;  . CLOSED REDUCTION NASAL FRACTURE N/A 08/11/2019   Procedure: CLOSED REDUCTION NASAL FRACTURE WITH STABILIZATION;  Surgeon: Irene Limbo, MD;  Location: [place];  Service: Plastics;  Laterality: N/A;  . COLONOSCOPY N/A 08/12/2016   Procedure: COLONOSCOPY;  Surgeon: Otis Brace, MD;  Location: [place];  Service: Gastroenterology;  Laterality: N/A;  . ESOPHAGOGASTRODUODENOSCOPY N/A 08/12/2016   Procedure: ESOPHAGOGASTRODUODENOSCOPY (EGD);  Surgeon: Otis Brace, MD;  Location: [place];  Service: Gastroenterology;  Laterality: N/A;  . INSERTION OF DIALYSIS CATHETER N/A 08/07/2016   Procedure: INSERTION OF TUNNELED DIALYSIS CATHETER;  Surgeon: Angelia Mould, MD;  Location: [place];  Service: Vascular;  Laterality: N/A;  . LIGATION OF ARTERIOVENOUS  FISTULA Left 09/12/2016   Procedure: BANDING OF LEFT  ARTERIOVENOUS  FISTULA;  Surgeon: Angelia Mould, MD;  Location: [place];  Service: Vascular;  Laterality: Left;  . LOWER EXTREMITY INTERVENTION Right 12/02/2018   Procedure: LOWER EXTREMITY INTERVENTION;  Surgeon: Algernon Huxley, MD;  Location: [place] [facility];  Service: Cardiovascular;  Laterality: Right;   Patient Active Problem List   Diagnosis Date Noted  . Pulmonary edema 01/31/2020  . Syncopal episodes 01/24/2020  . Macrocytic anemia 01/24/2020  . Hyperkalemia 01/24/2020  . Fluid overload 11/12/2019  . Acute respiratory failure with hypoxia ([place]) 11/12/2019  . Aortic atherosclerosis ([place]) 11/12/2019  . Emphysema of lung ([place]) 11/12/2019  . Acute pulmonary edema ([place]) 10/31/2019  . Acute on chronic congestive heart failure ([place]) 10/22/2019  . Acute respiratory distress 10/06/2019  . Volume overload 10/05/2019  . Hypokalemia 10/05/2019  . ESRD (end stage renal disease) ([place]) 10/01/2019  . Leg mass, right   . Hyperbilirubinemia   . Symptomatic anemia 09/19/2019  .  Pruritus 09/19/2019  . Hemoptysis 02/11/2019  . Hypercalcemia 01/19/2019  . Anemia in ESRD (end-stage renal disease) ([place]) 05/17/2018  . Hepatitis B 03/19/2018  . ESRD on dialysis ([place]) 05/26/2017  . Essential hypertension 05/26/2017  . Urinary tract infection, site not specified 10/17/2016  . Coagulation defect, unspecified ([place]) 08/13/2016  . Diarrhea, unspecified 08/13/2016  .  Encounter for immunization 08/13/2016  . Headache, unspecified 08/13/2016  . Hypertensive chronic kidney disease with stage 5 chronic kidney disease or end stage renal disease ([place]) 08/13/2016  . Iron deficiency anemia, unspecified 08/13/2016  . Pain, unspecified 08/13/2016  . Moderate protein-calorie malnutrition ([employer]) 08/13/2016  . Secondary hyperparathyroidism of renal origin ([place]) 08/13/2016      Prior to Admission medications   Medication Sig Start Date End Date Taking? Authorizing Provider  acetaminophen (TYLENOL) 500 MG tablet Take 1,000 mg by mouth every 6 (six) hours as needed for mild pain or fever.  03/09/19 03/07/20  [provider]  albuterol (PROVENTIL HFA;VENTOLIN HFA) 108 (90 Base) MCG/ACT inhaler Inhale 2 puffs into the lungs every 4 (four) hours as needed for wheezing or shortness of breath. 10/03/18   Orpah Greek, MD  calcitRIOL (ROCALTROL) 0.5 MCG capsule Take 1 capsule (0.5 mcg total) by mouth every Monday, Wednesday, and Friday with hemodialysis. 10/24/19   Pokhrel, Corrie Mckusick, MD  calcium acetate (PHOSLO) 667 MG capsule Take 2 capsules (1,334 mg total) by mouth 3 (three) times daily with meals. 02/12/19   Fritzi Mandes, MD  camphor-menthol [facility]) lotion Apply 1 application topically as needed for itching. 11/23/19   Charlann Lange, PA-C  hydrOXYzine (ATARAX/VISTARIL) 50 MG tablet Take 50 mg by mouth 3 (three) times daily. 08/09/19   [provider]  sildenafil (VIAGRA) 50 MG tablet Take 50 mg by mouth daily as needed for erectile dysfunction. 07/20/19   [provider]  triamcinolone cream (KENALOG) 0.1 % Apply 1 application topically 2 (two) times daily. 11/23/19   Charlann Lange, PA-C    Allergies Patient has no known allergies.    Social History Social History   Tobacco Use  . Smoking status: Current Every Day Smoker    Packs/day: 0.50    Years: 40.00    Pack years: 20.00    Types: Cigarettes  . Smokeless tobacco: Never Used   Substance Use Topics  . Alcohol use: Yes    Alcohol/week: 21.0 standard drinks    Types: 21 Cans of beer per week    Comment: none since 08/04/16  . Drug use: No    Review of Systems Patient denies headaches, rhinorrhea, blurry vision, numbness, shortness of breath, chest pain, edema, cough, abdominal pain, nausea, vomiting, diarrhea, dysuria, fevers, rashes or hallucinations unless otherwise stated above in HPI. ____________________________________________   PHYSICAL EXAM:  VITAL SIGNS: Vitals:   01/31/20 2200 01/31/20 2300  BP: (!) 156/95 (!) 144/95  Pulse: (!) 103 87  Resp:    Temp:    SpO2: 98% 93%    Constitutional: Alert and oriented.  Eyes: Conjunctivae are normal.  Head: Atraumatic. Nose: No congestion/rhinnorhea. Mouth/Throat: Mucous membranes are moist.   Neck: No stridor. Painless ROM.  Cardiovascular: Normal rate, regular rhythm. Grossly normal heart sounds.  Good peripheral circulation. Respiratory: mild tachypnea.  No retractions lungs with diminished posterior lung fields Gastrointestinal: Soft and nontender. No distention. No abdominal bruits. No CVA tenderness. Genitourinary: pitting scrotal edema, no cellulitis, crepitus or blistering Musculoskeletal: pitting edema to umbilicus.  No joint effusions. Neurologic:  Normal speech and language. No gross focal  neurologic deficits are appreciated. No facial droop Skin:  Skin is warm, dry and intact. No rash noted. Psychiatric: Mood and affect are normal. Speech and behavior are normal.  ____________________________________________   LABS (all labs ordered are listed, but only abnormal results are displayed)  Results for orders placed or performed during the hospital encounter of 01/31/20 (from the past 24 hour(s))  CBC with Differential/Platelet     Status: Abnormal   Collection Time: 01/31/20  9:23 PM  Result Value Ref Range   WBC 8.2 4.0 - 10.5 K/uL   RBC 2.77 (L) 4.22 - 5.81 MIL/uL   Hemoglobin 9.0  (L) 13.0 - 17.0 g/dL   HCT 30.2 (L) 39.0 - 52.0 %   MCV 109.0 (H) 80.0 - 100.0 fL   MCH 32.5 26.0 - 34.0 pg   MCHC 29.8 (L) 30.0 - 36.0 g/dL   RDW 16.2 (H) 11.5 - 15.5 %   Platelets 144 (L) 150 - 400 K/uL   nRBC 0.0 0.0 - 0.2 %   Neutrophils Relative % 72 %   Neutro Abs 6.0 1.7 - 7.7 K/uL   Lymphocytes Relative 11 %   Lymphs Abs 0.9 0.7 - 4.0 K/uL   Monocytes Relative 9 %   Monocytes Absolute 0.7 0.1 - 1.0 K/uL   Eosinophils Relative 6 %   Eosinophils Absolute 0.5 0.0 - 0.5 K/uL   Basophils Relative 1 %   Basophils Absolute 0.0 0.0 - 0.1 K/uL   Immature Granulocytes 1 %   Abs Immature Granulocytes 0.05 0.00 - 0.07 K/uL  Basic metabolic panel     Status: Abnormal   Collection Time: 01/31/20  9:23 PM  Result Value Ref Range   Sodium 144 135 - 145 mmol/L   Potassium 4.7 3.5 - 5.1 mmol/L   Chloride 101 98 - 111 mmol/L   CO2 25 22 - 32 mmol/L   Glucose, Bld 79 70 - 99 mg/dL   BUN 66 (H) 6 - 20 mg/dL   Creatinine, Ser 14.20 (H) 0.61 - 1.24 mg/dL   Calcium 9.1 8.9 - 10.3 mg/dL   GFR calc non Af Amer 3 (L) >60 mL/min   GFR calc Af Amer 4 (L) >60 mL/min   Anion gap 18 (H) 5 - 15  Hepatic function panel     Status: Abnormal   Collection Time: 01/31/20  9:23 PM  Result Value Ref Range   Total Protein 7.9 6.5 - 8.1 g/dL   Albumin 3.9 3.5 - 5.0 g/dL   AST 26 15 - 41 U/L   ALT 14 0 - 44 U/L   Alkaline Phosphatase 106 38 - 126 U/L   Total Bilirubin 1.7 (H) 0.3 - 1.2 mg/dL   Bilirubin, Direct 0.4 (H) 0.0 - 0.2 mg/dL   Indirect Bilirubin 1.3 (H) 0.3 - 0.9 mg/dL   ____________________________________________  EKG My review and personal interpretation at Time: 21:17   Indication: sob   Rate: 21:17  Rhythm: sinus Axis: normal Other: normal intervals, nonspecific st abn, no stemi or depressions ____________________________________________  RADIOLOGY  I personally reviewed all radiographic images ordered to evaluate for the above acute complaints and reviewed radiology reports and  findings.  These findings were personally discussed with the patient.  Please see medical record for radiology report.  ____________________________________________   PROCEDURES  Procedure(s) performed:  .Critical Care Performed by: Merlyn Lot, MD Authorized by: Merlyn Lot, MD   Critical care provider statement:    Critical care time (minutes):  15   Critical care  time was exclusive of:  Separately billable procedures and treating other patients   Critical care was necessary to treat or prevent imminent or life-threatening deterioration of the following conditions:  Circulatory failure and renal failure   Critical care was time spent personally by me on the following activities:  Development of treatment plan with patient or surrogate, discussions with consultants, evaluation of patient's response to treatment, examination of patient, obtaining history from patient or surrogate, ordering and performing treatments and interventions, ordering and review of laboratory studies, ordering and review of radiographic studies, pulse oximetry, re-evaluation of patient's condition and review of old charts      Critical Care performed: yes ____________________________________________   INITIAL IMPRESSION / ASSESSMENT AND PLAN / ED COURSE  Pertinent labs & imaging results that were available during my care of the patient were reviewed by me and considered in my medical decision making (see chart for details).   DDX: CHF, hypertensive urgency, COPD, hyperkalemia, lecture light abnormality, dialysis noncompliance  Jordan [name] is a 54 y.o. who presents to the ED with symptoms as described above.  Patient is short of breath and protecting his airway.  He is saturating appropriately on 2 to 3 L nasal cannula right now but is mildly tachypneic hypertensive with chest x-ray showing evidence of pulmonary edema.  Potassium is normal.  Will give IV labetalol for blood pressure control as  well as topical nitrates.  Discussed case with nephrology likely my concern for volume overload and missed dialysis.  Will discuss with hospitalist for admission for blood pressure control.  Patient demonstrates understanding and agrees to plan.     The patient was evaluated in Emergency Department today for the symptoms described in the history of present illness. He/she was evaluated in the context of the global COVID-19 pandemic, which necessitated consideration that the patient might be at risk for infection with the SARS-CoV-2 virus that causes COVID-19. Institutional protocols and algorithms that pertain to the evaluation of patients at risk for COVID-19 are in a state of rapid change based on information released by regulatory bodies including the CDC and federal and state organizations. These policies and algorithms were followed during the patient's care in the ED.  As part of my medical decision making, I reviewed the following data within the [place] notes reviewed and incorporated, Labs reviewed, notes from prior ED visits and [place] Controlled Substance Database   ____________________________________________   FINAL CLINICAL IMPRESSION(S) / ED DIAGNOSES  Final diagnoses:  Hypertensive urgency  Acute pulmonary edema ([place])      NEW MEDICATIONS STARTED DURING THIS VISIT:  New Prescriptions   No medications on file     Note:  This document was prepared using Dragon voice recognition software and may include unintentional dictation errors.    Merlyn Lot, MD 01/31/20 [phone]

## 2020-01-31 NOTE — ED Notes (Signed)
MD at bedside. 

## 2020-01-31 NOTE — ED Triage Notes (Signed)
pt presents from home via [facility] with c/o hx of testicular swelling, biltaeral lower leg swelling and shortness of breath. Pitting edema noted to lower legs. Pt states swelling has been present for about 2 days. mwf dialysis, hasnt been since friday. Pt respirations even and labored at this time. Pt does not normally require oxygen. Pt placed on 3L by ems. Pt oxygen saturation 100% on 3L. Pt alert at this time.

## 2020-01-31 NOTE — H&P (Signed)
History and Physical   Jordan [name] GNF:[id number] DOB: 1966/01/19 DOA: 01/31/2020  Referring MD/NP/PA: Dr. Quentin Cornwall  PCP: Jearld Fenton, NP   Outpatient Specialists: [facility]  Patient coming from: Home  Chief Complaint: Shortness of breath  HPI: Jordan [name] is a 54 y.o. male with medical history significant of end-stage renal disease on hemodialysis Mondays Wednesdays Fridays, COPD, previous alcohol abuse, hypertension, hyperlipidemia who has been noncompliant with his hemodialysis.  Patient apparently missed hemodialysis yesterday.  He presented to the ER with shortness of breath cough and tachypnea.  Patient was found to be in fluid overload.  He denied any chest pain but overall pressure.  Denied any sick contacts.  Denied any fever or chills.  Patient was evaluated for COVID-19 and so far negative.  At this point he appears to have fluid overload as a result of missed dialysis.  Nephrology consulted and recommended admission with plan to dialyze patient in the morning..  ED Course: Temperature is 97.8 blood pressure 178/104 pulse 105 respirate 23 oxygen sat 93% room air.  White count 8.2 hemoglobin 9.01 platelet 144.  Sodium 1 4040 potassium 4.7 BUN is 66 creatinine 14.2.  The rest of the chemistry appears to be within normal.  Chest x-ray showed cardiomegaly with increased diffuse bilateral airspace opacities likely pulmonary edema.  Patient is being admitted placed on oxygen and will be dialyzed in the morning.  Review of Systems: As per HPI otherwise 10 point review of systems negative.    Past Medical History:  Diagnosis Date  . Anemia   . Aortic atherosclerosis ([place]) 11/12/2019  . CKD (chronic kidney disease)    Stage 5  Dialysis - M/W/F in [place], [place]  . Dyspnea    tx with inhaler when sick  . ED (erectile dysfunction)   . Emphysema of lung ([place]) 11/12/2019  . ETOH abuse   . History of blood transfusion   . Hypertension   . Wears dentures      Past Surgical History:  Procedure Laterality Date  . BASCILIC VEIN TRANSPOSITION Left 08/07/2016   Procedure: LEFT BASILIC VEIN TRANSPOSITION;  Surgeon: Angelia Mould, MD;  Location: [facility];  Service: Vascular;  Laterality: Left;  . CLOSED REDUCTION NASAL FRACTURE N/A 08/11/2019   Procedure: CLOSED REDUCTION NASAL FRACTURE WITH STABILIZATION;  Surgeon: Irene Limbo, MD;  Location: [facility];  Service: Plastics;  Laterality: N/A;  . COLONOSCOPY N/A 08/12/2016   Procedure: COLONOSCOPY;  Surgeon: Otis Brace, MD;  Location: [facility];  Service: Gastroenterology;  Laterality: N/A;  . ESOPHAGOGASTRODUODENOSCOPY N/A 08/12/2016   Procedure: ESOPHAGOGASTRODUODENOSCOPY (EGD);  Surgeon: Otis Brace, MD;  Location: [facility];  Service: Gastroenterology;  Laterality: N/A;  . INSERTION OF DIALYSIS CATHETER N/A 08/07/2016   Procedure: INSERTION OF TUNNELED DIALYSIS CATHETER;  Surgeon: Angelia Mould, MD;  Location: [facility];  Service: Vascular;  Laterality: N/A;  . LIGATION OF ARTERIOVENOUS  FISTULA Left 09/12/2016   Procedure: BANDING OF LEFT  ARTERIOVENOUS  FISTULA;  Surgeon: Angelia Mould, MD;  Location: [facility];  Service: Vascular;  Laterality: Left;  . LOWER EXTREMITY INTERVENTION Right 12/02/2018   Procedure: LOWER EXTREMITY INTERVENTION;  Surgeon: Algernon Huxley, MD;  Location: [facility];  Service: Cardiovascular;  Laterality: Right;     reports that he has been smoking cigarettes. He has a 20.00 pack-year smoking history. He has never used smokeless tobacco. He reports current alcohol use of about 21.0 standard drinks of alcohol per week. He reports that  he does not use drugs.  No Known Allergies  Family History  Problem Relation Age of Onset  . Diabetes Mother   . Kidney failure Mother   . Healthy Father   . Kidney failure Brother   . Healthy Sister   . Kidney disease Daughter   . Post-traumatic stress disorder Neg Hx   . Bladder Cancer Neg Hx    . Kidney cancer Neg Hx      Prior to Admission medications   Medication Sig Start Date End Date Taking? Authorizing Provider  acetaminophen (TYLENOL) 500 MG tablet Take 1,000 mg by mouth every 6 (six) hours as needed for mild pain or fever.  03/09/19 03/07/20  [provider]  albuterol (PROVENTIL HFA;VENTOLIN HFA) 108 (90 Base) MCG/ACT inhaler Inhale 2 puffs into the lungs every 4 (four) hours as needed for wheezing or shortness of breath. 10/03/18   Orpah Greek, MD  calcitRIOL (ROCALTROL) 0.5 MCG capsule Take 1 capsule (0.5 mcg total) by mouth every Monday, Wednesday, and Friday with hemodialysis. 10/24/19   Pokhrel, Corrie Mckusick, MD  calcium acetate (PHOSLO) 667 MG capsule Take 2 capsules (1,334 mg total) by mouth 3 (three) times daily with meals. 02/12/19   Fritzi Mandes, MD  camphor-menthol [facility]) lotion Apply 1 application topically as needed for itching. 11/23/19   Charlann Lange, PA-C  hydrOXYzine (ATARAX/VISTARIL) 50 MG tablet Take 50 mg by mouth 3 (three) times daily. 08/09/19   [provider]  sildenafil (VIAGRA) 50 MG tablet Take 50 mg by mouth daily as needed for erectile dysfunction. 07/20/19   [provider]  triamcinolone cream (KENALOG) 0.1 % Apply 1 application topically 2 (two) times daily. 11/23/19   Charlann Lange, PA-C    Physical Exam: Vitals:   01/31/20 2118 01/31/20 2119 01/31/20 2200 01/31/20 2300  BP: (!) 178/104  (!) 156/95 (!) 144/95  Pulse: (!) 105  (!) 103 87  Resp: (!) 23     Temp:      TempSrc:      SpO2: 100%  98% 93%  Weight:  72.6 kg    Height:  5\' 4"  (1.626 m)        Constitutional: Anxious, no acute distress Vitals:   01/31/20 2118 01/31/20 2119 01/31/20 2200 01/31/20 2300  BP: (!) 178/104  (!) 156/95 (!) 144/95  Pulse: (!) 105  (!) 103 87  Resp: (!) 23     Temp:      TempSrc:      SpO2: 100%  98% 93%  Weight:  72.6 kg    Height:  5\' 4"  (1.626 m)     Eyes: PERRL, lids and conjunctivae normal ENMT: Mucous  membranes are moist. Posterior pharynx clear of any exudate or lesions.Normal dentition.  Neck: normal, supple, no masses, no thyromegaly Respiratory: Decreased air entry bilaterally with basal crackles normal respiratory effort. No accessory muscle use.  Cardiovascular: Regular rate and rhythm, no murmurs / rubs / gallops. No extremity edema. 2+ pedal pulses. No carotid bruits.  Abdomen: no tenderness, no masses palpated. No hepatosplenomegaly. Bowel sounds positive.  Musculoskeletal: no clubbing / cyanosis. No joint deformity upper and lower extremities. Good ROM, no contractures. Normal muscle tone.  Skin: no rashes, lesions, ulcers. No induration Neurologic: CN 2-12 grossly intact. Sensation intact, DTR normal. Strength 5/5 in all 4.  Psychiatric: Normal judgment and insight. Alert and oriented x 3. Normal mood.     Labs on Admission: I have personally reviewed following labs and imaging studies  CBC: Recent Labs  Lab 01/25/20 0500 01/31/20 2123  WBC 7.6 8.2  NEUTROABS  --  6.0  HGB 8.2* 9.0*  HCT 26.8* 30.2*  MCV 108.5* 109.0*  PLT 159 696*   Basic Metabolic Panel: Recent Labs  Lab 01/25/20 0500 01/31/20 2123  NA 139 144  K 4.7 4.7  CL 101 101  CO2 24 25  GLUCOSE 134* 79  BUN 60* 66*  CREATININE 13.08* 14.20*  CALCIUM 8.8* 9.1   GFR: Estimated Creatinine Clearance: 5.6 mL/min (A) (by C-G formula based on SCr of 14.2 mg/dL (H)). Liver Function Tests: Recent Labs  Lab 01/25/20 0500 01/31/20 2123  AST 14* 26  ALT 13 14  ALKPHOS 91 106  BILITOT 1.3* 1.7*  PROT 6.3* 7.9  ALBUMIN 2.8* 3.9   No results for input(s): LIPASE, AMYLASE in the last 168 hours. No results for input(s): AMMONIA in the last 168 hours. Coagulation Profile: No results for input(s): INR, PROTIME in the last 168 hours. Cardiac Enzymes: No results for input(s): CKTOTAL, CKMB, CKMBINDEX, TROPONINI in the last 168 hours. BNP (last 3 results) No results for input(s): PROBNP in the last 8760  hours. HbA1C: No results for input(s): HGBA1C in the last 72 hours. CBG: Recent Labs  Lab 01/25/20 0545 01/26/20 0609  GLUCAP 72 83   Lipid Profile: No results for input(s): CHOL, HDL, LDLCALC, TRIG, CHOLHDL, LDLDIRECT in the last 72 hours. Thyroid Function Tests: No results for input(s): TSH, T4TOTAL, FREET4, T3FREE, THYROIDAB in the last 72 hours. Anemia Panel: No results for input(s): VITAMINB12, FOLATE, FERRITIN, TIBC, IRON, RETICCTPCT in the last 72 hours. Urine analysis:    Component Value Date/Time   COLORURINE YELLOW 01/24/2020 0904   APPEARANCEUR CLEAR 01/24/2020 0904   LABSPEC 1.012 01/24/2020 0904   PHURINE 9.0 (H) 01/24/2020 0904   GLUCOSEU 150 (A) 01/24/2020 0904   HGBUR SMALL (A) 01/24/2020 0904   BILIRUBINUR NEGATIVE 01/24/2020 0904   KETONESUR NEGATIVE 01/24/2020 0904   PROTEINUR 100 (A) 01/24/2020 0904   NITRITE NEGATIVE 01/24/2020 0904   LEUKOCYTESUR MODERATE (A) 01/24/2020 0904   Sepsis Labs: @LABRCNTIP (procalcitonin:4,lacticidven:4) ) Recent Results (from the past 240 hour(s))  Respiratory Panel by RT PCR (Flu A&B, Covid) - Nasopharyngeal Swab     Status: None   Collection Time: 01/24/20  3:52 AM   Specimen: Nasopharyngeal Swab  Result Value Ref Range Status   SARS Coronavirus 2 by RT PCR NEGATIVE NEGATIVE Final    Comment: (NOTE) SARS-CoV-2 target nucleic acids are NOT DETECTED. The SARS-CoV-2 RNA is generally detectable in upper respiratoy specimens during the acute phase of infection. The lowest concentration of SARS-CoV-2 viral copies this assay can detect is 131 copies/mL. A negative result does not preclude SARS-Cov-2 infection and should not be used as the sole basis for treatment or other patient management decisions. A negative result may occur with  improper specimen collection/handling, submission of specimen other than nasopharyngeal swab, presence of viral mutation(s) within the areas targeted by this assay, and inadequate number of  viral copies (<131 copies/mL). A negative result must be combined with clinical observations, patient history, and epidemiological information. The expected result is Negative. Fact Sheet for Patients:  PinkCheek.be Fact Sheet for Healthcare Providers:  GravelBags.it This test is not yet ap proved or cleared by the [place] FDA and  has been authorized for detection and/or diagnosis of SARS-CoV-2 by FDA under an Emergency Use Authorization (EUA). This EUA will remain  in effect (meaning this test can be used) for the duration of the COVID-19  declaration under Section 564(b)(1) of the Act, 21 U.S.C. section 360bbb-3(b)(1), unless the authorization is terminated or revoked sooner.    Influenza A by PCR NEGATIVE NEGATIVE Final   Influenza B by PCR NEGATIVE NEGATIVE Final    Comment: (NOTE) The Xpert Xpress SARS-CoV-2/FLU/RSV assay is intended as an aid in  the diagnosis of influenza from Nasopharyngeal swab specimens and  should not be used as a sole basis for treatment. Nasal washings and  aspirates are unacceptable for Xpert Xpress SARS-CoV-2/FLU/RSV  testing. Fact Sheet for Patients: PinkCheek.be Fact Sheet for Healthcare Providers: GravelBags.it This test is not yet approved or cleared by the [place] FDA and  has been authorized for detection and/or diagnosis of SARS-CoV-2 by  FDA under an Emergency Use Authorization (EUA). This EUA will remain  in effect (meaning this test can be used) for the duration of the  Covid-19 declaration under Section 564(b)(1) of the Act, 21  U.S.C. section 360bbb-3(b)(1), unless the authorization is  terminated or revoked. Performed at [facility], [place] [address] [place], [place] [address]      Radiological Exams on Admission: DG Chest Portable 1 View  Result Date: 01/31/2020 CLINICAL DATA:  Shortness of breath  EXAM: PORTABLE CHEST 1 VIEW COMPARISON:  Jan 24, 2020 FINDINGS: There is unchanged mild cardiomegaly. There is prominence of the central pulmonary vasculature with mildly increased left airspace opacities seen throughout both, increased from the prior exam. No pleural effusion. No acute osseous abnormality. IMPRESSION: Cardiomegaly and increased diffuse bilateral airspace opacities, likely pulmonary edema, however infectious etiology. Electronically Signed   By: Prudencio Pair M.D.   On: 01/31/2020 21:43    EKG: Independently reviewed.  Sinus tachycardia with no significant ST changes.  Assessment/Plan Principal Problem:   Pulmonary edema Active Problems:   ESRD on dialysis [facility])   Essential hypertension   Anemia in ESRD (end-stage renal disease) ([place])   Acute respiratory failure with hypoxia (HCC)     #1 pulmonary edema: Secondary to missed dialysis and end-stage renal disease.  Patient will be admitted.  Continue home regimen.  Placed on oxygen.  He will have dialysis first thing in the morning to catch up.  #2 end-stage renal disease: Hemodialysis Mondays Wednesdays and Fridays.  Patient to be dialyzed tomorrow.  #3 essential hypertension: Has hypertensive urgency at the moment.  Resume home regimen.  Dialysis.  #4 anemia of chronic disease: Continue to monitor H&H.  #5 medication noncompliance: Counseling provided   DVT prophylaxis: Heparin Code Status: Full code Family Communication: No family at bedside Disposition Plan: Home Consults called: Dr. Holley Raring nephrology Admission status: Observation  Severity of Illness: The appropriate patient status for this patient is OBSERVATION. Observation status is judged to be reasonable and necessary in order to provide the required intensity of service to ensure the patient's safety. The patient's presenting symptoms, physical exam findings, and initial radiographic and laboratory data in the context of their medical condition is felt to  place them at decreased risk for further clinical deterioration. Furthermore, it is anticipated that the patient will be medically stable for discharge from the hospital within 2 midnights of admission. The following factors support the patient status of observation.   " The patient's presenting symptoms include shortness of breath. " The physical exam findings include bilateral crackles. " The initial radiographic and laboratory data are evidence of pulmonary edema.     Barbette Merino MD [facility] Pager [phone]  If 7PM-7AM, please contact night-coverage [email or web address] Password [email or web address]  01/31/2020, 11:33 PM

## 2020-02-01 DIAGNOSIS — N50819 Testicular pain, unspecified: Secondary | ICD-10-CM | POA: Diagnosis present

## 2020-02-01 DIAGNOSIS — I10 Essential (primary) hypertension: Secondary | ICD-10-CM | POA: Diagnosis not present

## 2020-02-01 DIAGNOSIS — I160 Hypertensive urgency: Secondary | ICD-10-CM | POA: Diagnosis present

## 2020-02-01 DIAGNOSIS — Z9115 Patient's noncompliance with renal dialysis: Secondary | ICD-10-CM | POA: Diagnosis not present

## 2020-02-01 DIAGNOSIS — J9601 Acute respiratory failure with hypoxia: Secondary | ICD-10-CM | POA: Diagnosis present

## 2020-02-01 DIAGNOSIS — D631 Anemia in chronic kidney disease: Secondary | ICD-10-CM | POA: Diagnosis not present

## 2020-02-01 DIAGNOSIS — F1010 Alcohol abuse, uncomplicated: Secondary | ICD-10-CM | POA: Diagnosis present

## 2020-02-01 DIAGNOSIS — E8770 Fluid overload, unspecified: Secondary | ICD-10-CM | POA: Diagnosis present

## 2020-02-01 DIAGNOSIS — I120 Hypertensive chronic kidney disease with stage 5 chronic kidney disease or end stage renal disease: Secondary | ICD-10-CM | POA: Diagnosis present

## 2020-02-01 DIAGNOSIS — Z9114 Patient's other noncompliance with medication regimen: Secondary | ICD-10-CM | POA: Diagnosis not present

## 2020-02-01 DIAGNOSIS — E785 Hyperlipidemia, unspecified: Secondary | ICD-10-CM | POA: Diagnosis present

## 2020-02-01 DIAGNOSIS — Z992 Dependence on renal dialysis: Secondary | ICD-10-CM | POA: Diagnosis not present

## 2020-02-01 DIAGNOSIS — N2581 Secondary hyperparathyroidism of renal origin: Secondary | ICD-10-CM | POA: Diagnosis not present

## 2020-02-01 DIAGNOSIS — Z5329 Procedure and treatment not carried out because of patient's decision for other reasons: Secondary | ICD-10-CM | POA: Diagnosis not present

## 2020-02-01 DIAGNOSIS — J810 Acute pulmonary edema: Secondary | ICD-10-CM | POA: Diagnosis present

## 2020-02-01 DIAGNOSIS — Z841 Family history of disorders of kidney and ureter: Secondary | ICD-10-CM | POA: Diagnosis not present

## 2020-02-01 DIAGNOSIS — J449 Chronic obstructive pulmonary disease, unspecified: Secondary | ICD-10-CM | POA: Diagnosis present

## 2020-02-01 DIAGNOSIS — Z20822 Contact with and (suspected) exposure to covid-19: Secondary | ICD-10-CM | POA: Diagnosis present

## 2020-02-01 DIAGNOSIS — N186 End stage renal disease: Secondary | ICD-10-CM | POA: Diagnosis present

## 2020-02-01 DIAGNOSIS — Z79899 Other long term (current) drug therapy: Secondary | ICD-10-CM | POA: Diagnosis not present

## 2020-02-01 DIAGNOSIS — Z833 Family history of diabetes mellitus: Secondary | ICD-10-CM | POA: Diagnosis not present

## 2020-02-01 LAB — CBC
HCT: 25.5 % — ABNORMAL LOW (ref 39.0–52.0)
Hemoglobin: 7.6 g/dL — ABNORMAL LOW (ref 13.0–17.0)
MCH: 32.3 pg (ref 26.0–34.0)
MCHC: 29.8 g/dL — ABNORMAL LOW (ref 30.0–36.0)
MCV: 108.5 fL — ABNORMAL HIGH (ref 80.0–100.0)
Platelets: 129 10*3/uL — ABNORMAL LOW (ref 150–400)
RBC: 2.35 MIL/uL — ABNORMAL LOW (ref 4.22–5.81)
RDW: 16.3 % — ABNORMAL HIGH (ref 11.5–15.5)
WBC: 7.5 10*3/uL (ref 4.0–10.5)
nRBC: 0 % (ref 0.0–0.2)

## 2020-02-01 LAB — GLUCOSE, CAPILLARY: Glucose-Capillary: 88 mg/dL (ref 70–99)

## 2020-02-01 LAB — COMPREHENSIVE METABOLIC PANEL
ALT: 12 U/L (ref 0–44)
AST: 15 U/L (ref 15–41)
Albumin: 3.5 g/dL (ref 3.5–5.0)
Alkaline Phosphatase: 104 U/L (ref 38–126)
Anion gap: 16 — ABNORMAL HIGH (ref 5–15)
BUN: 70 mg/dL — ABNORMAL HIGH (ref 6–20)
CO2: 23 mmol/L (ref 22–32)
Calcium: 8.9 mg/dL (ref 8.9–10.3)
Chloride: 101 mmol/L (ref 98–111)
Creatinine, Ser: 14.57 mg/dL — ABNORMAL HIGH (ref 0.61–1.24)
GFR calc Af Amer: 4 mL/min — ABNORMAL LOW (ref >60)
GFR calc non Af Amer: 3 mL/min — ABNORMAL LOW (ref >60)
Glucose, Bld: 142 mg/dL — ABNORMAL HIGH (ref 70–99)
Potassium: 4.8 mmol/L (ref 3.5–5.1)
Sodium: 140 mmol/L (ref 135–145)
Total Bilirubin: 1.5 mg/dL — ABNORMAL HIGH (ref 0.3–1.2)
Total Protein: 7 g/dL (ref 6.5–8.1)

## 2020-02-01 LAB — MRSA PCR SCREENING: MRSA by PCR: NEGATIVE

## 2020-02-01 LAB — SARS CORONAVIRUS 2 BY RT PCR (HOSPITAL ORDER, PERFORMED IN ~~LOC~~ HOSPITAL LAB): SARS Coronavirus 2: NEGATIVE

## 2020-02-01 MED ORDER — PHENOL 1.4 % MT LIQD
1.0000 | OROMUCOSAL | Status: DC | PRN
  Administered 2020-02-01: 1 via OROMUCOSAL
  Filled 2020-02-01: qty 177

## 2020-02-01 MED ORDER — CHLORHEXIDINE GLUCONATE CLOTH 2 % EX PADS: 6.0000 | MEDICATED_PAD | Freq: Every day | CUTANEOUS | Status: DC

## 2020-02-01 MED ORDER — CALCIUM CARBONATE ANTACID 1250 MG/5ML PO SUSP
500.0000 mg | Freq: Four times a day (QID) | ORAL | Status: DC | PRN
  Filled 2020-02-01: qty 5

## 2020-02-01 MED ORDER — HYDROXYZINE HCL 25 MG PO TABS
25.0000 mg | ORAL_TABLET | Freq: Three times a day (TID) | ORAL | Status: DC | PRN
  Administered 2020-02-01: 25 mg via ORAL
  Filled 2020-02-01 (×2): qty 1

## 2020-02-01 MED ORDER — ACETAMINOPHEN 325 MG PO TABS: 650.0000 mg | ORAL_TABLET | Freq: Four times a day (QID) | ORAL | Status: DC | PRN

## 2020-02-01 MED ORDER — DOCUSATE SODIUM 283 MG RE ENEM
1.0000 | ENEMA | RECTAL | Status: DC | PRN
  Filled 2020-02-01: qty 1

## 2020-02-01 MED ORDER — IPRATROPIUM-ALBUTEROL 0.5-2.5 (3) MG/3ML IN SOLN
3.0000 mL | RESPIRATORY_TRACT | Status: DC | PRN
  Administered 2020-02-01: 3 mL via RESPIRATORY_TRACT
  Filled 2020-02-01: qty 3

## 2020-02-01 MED ORDER — ONDANSETRON HCL 4 MG PO TABS: 4.0000 mg | ORAL_TABLET | Freq: Four times a day (QID) | ORAL | Status: DC | PRN

## 2020-02-01 MED ORDER — HYDRALAZINE HCL 20 MG/ML IJ SOLN
10.0000 mg | Freq: Four times a day (QID) | INTRAMUSCULAR | Status: DC | PRN
  Filled 2020-02-01: qty 0.5

## 2020-02-01 MED ORDER — CAMPHOR-MENTHOL 0.5-0.5 % EX LOTN
1.0000 "application " | TOPICAL_LOTION | Freq: Three times a day (TID) | CUTANEOUS | Status: DC | PRN
  Filled 2020-02-01 (×2): qty 222

## 2020-02-01 MED ORDER — ACETAMINOPHEN 650 MG RE SUPP: 650.0000 mg | Freq: Four times a day (QID) | RECTAL | Status: DC | PRN

## 2020-02-01 MED ORDER — ONDANSETRON HCL 4 MG/2ML IJ SOLN: 4.0000 mg | Freq: Four times a day (QID) | INTRAMUSCULAR | Status: DC | PRN

## 2020-02-01 MED ORDER — ZOLPIDEM TARTRATE 5 MG PO TABS: 5.0000 mg | ORAL_TABLET | Freq: Every evening | ORAL | Status: DC | PRN

## 2020-02-01 MED ORDER — HEPARIN SODIUM (PORCINE) 5000 UNIT/ML IJ SOLN: 5000.0000 [IU] | Freq: Three times a day (TID) | INTRAMUSCULAR | Status: DC

## 2020-02-01 MED ORDER — SORBITOL 70 % SOLN
30.0000 mL | Status: DC | PRN
  Filled 2020-02-01: qty 30

## 2020-02-01 MED ORDER — AMLODIPINE BESYLATE 10 MG PO TABS
10.0000 mg | ORAL_TABLET | Freq: Every day | ORAL | Status: DC
  Filled 2020-02-01: qty 1

## 2020-02-01 MED ORDER — NEPRO/CARBSTEADY PO LIQD: 237.0000 mL | Freq: Three times a day (TID) | ORAL | Status: DC | PRN

## 2020-02-01 NOTE — TOC Initial Note (Signed)
Transition of Care [facility]) - Initial/Assessment Note    Patient Details  Name: Jordan [name] MRN: [id number] Date of Birth: 1966/06/28  [facility]) CM/SW Contact:    Elease Hashimoto, LCSW Phone Number: 02/01/2020, 8:56 AM  Clinical Narrative:  Met with pt who is a high risk pt since was recently here one week ago. He reports he started feeling bad and missed his HD treatment which made him feel worse then came to the hospital. He goes to HD M,W,F and takes the [facility]. He lives alone and is independent with all mobility and home management. He has a brother who checks on him. When asked reason he is non--compliant he had no answer. He states: " I don't know." He has a PCP and health insurance. Will follow at a distance in case any needs.                  Expected Discharge Plan: Home/Self Care Barriers to Discharge: Continued Medical Work up   Patient Goals and CMS Choice Patient states their goals for this hospitalization and ongoing recovery are:: I feel better already after dialysis I hope I cna go home      Expected Discharge Plan and Services Expected Discharge Plan: Home/Self Care In-house Referral: Clinical Social Work     Living arrangements for the past 2 months: Single Family Home                                      Prior Living Arrangements/Services Living arrangements for the past 2 months: Single Family Home Lives with:: Self Patient language and need for interpreter reviewed:: No Do you feel safe going back to the place where you live?: Yes      Need for Family Participation in Patient Care: No (Comment) Care giver support system in place?: No (comment)   Criminal Activity/Legal Involvement Pertinent to Current Situation/Hospitalization: No - Comment as needed  Activities of Daily Living Home Assistive Devices/Equipment: Dentures (specify type)(upper and lower) ADL Screening (condition at time of admission) Patient's cognitive ability  adequate to safely complete daily activities?: Yes Is the patient deaf or have difficulty hearing?: No Does the patient have difficulty seeing, even when wearing glasses/contacts?: No Does the patient have difficulty concentrating, remembering, or making decisions?: No Patient able to express need for assistance with ADLs?: Yes Does the patient have difficulty dressing or bathing?: No Independently performs ADLs?: Yes (appropriate for developmental age) Does the patient have difficulty walking or climbing stairs?: No Weakness of Legs: None Weakness of Arms/Hands: None  Permission Sought/Granted Permission sought to share information with : Family Supports Permission granted to share information with : Yes, Verbal Permission Granted  Share Information with NAME: [place]     Permission granted to share info w Relationship: Brother     Emotional Assessment Appearance:: Appears stated age Attitude/Demeanor/Rapport: Engaged Affect (typically observed): Accepting Orientation: : Oriented to Self, Oriented to Place, Oriented to  Time, Oriented to Situation Alcohol / Substance Use: Not Applicable Psych Involvement: No (comment)  Admission diagnosis:  Acute pulmonary edema (HCC) [J81.0] Pulmonary edema [J81.1] Hypertensive urgency [I16.0] Patient Active Problem List   Diagnosis Date Noted  . Pulmonary edema 01/31/2020  . Syncopal episodes 01/24/2020  . Macrocytic anemia 01/24/2020  . Hyperkalemia 01/24/2020  . Fluid overload 11/12/2019  . Acute respiratory failure with hypoxia ([place]) 11/12/2019  . Aortic atherosclerosis ([place]) 11/12/2019  .  Emphysema of lung ([place]) 11/12/2019  . Acute pulmonary edema ([place]) 10/31/2019  . Acute on chronic congestive heart failure ([place]) 10/22/2019  . Acute respiratory distress 10/06/2019  . Volume overload 10/05/2019  . Hypokalemia 10/05/2019  . ESRD (end stage renal disease) ([place]) 10/01/2019  . Leg mass, right   . Hyperbilirubinemia   . Symptomatic  anemia 09/19/2019  . Pruritus 09/19/2019  . Hemoptysis 02/11/2019  . Hypercalcemia 01/19/2019  . Anemia in ESRD (end-stage renal disease) ([place]) 05/17/2018  . Hepatitis B 03/19/2018  . ESRD on dialysis ([place]) 05/26/2017  . Essential hypertension 05/26/2017  . Urinary tract infection, site not specified 10/17/2016  . Coagulation defect, unspecified ([place]) 08/13/2016  . Diarrhea, unspecified 08/13/2016  . Encounter for immunization 08/13/2016  . Headache, unspecified 08/13/2016  . Hypertensive chronic kidney disease with stage 5 chronic kidney disease or end stage renal disease ([place]) 08/13/2016  . Iron deficiency anemia, unspecified 08/13/2016  . Pain, unspecified 08/13/2016  . Moderate protein-calorie malnutrition ([place]) 08/13/2016  . Secondary hyperparathyroidism of renal origin ([place]) 08/13/2016   PCP:  Jearld Fenton, NP Pharmacy:   [facility], [place] [address] Phone: [phone] Fax: [phone]  [facility] - [place], [place] - [address] [employer], [address] [place] [address] Phone: [phone] Fax: [phone]  [place], [place] - [place] [address] [facility] [address] [place] [address] Phone: [phone] Fax: [phone]     Social Determinants of Health (SDOH) Interventions    Readmission Risk Interventions No flowsheet data found.
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
## 2020-02-01 NOTE — Progress Notes (Signed)
Hemodialysis patient known at [facility] MWF 11:30am, patient rides with [employer]. Please contact me with any dialysis placement concerns.  Elvera Bicker Dialysis Coordinator [phone]

## 2020-02-01 NOTE — Progress Notes (Signed)
[facility] [place] NAME: Jordan [name]    MR#:  [id number]  DATE OF BIRTH:  06/25/1966  SUBJECTIVE:  patient came in with increasing shortness of breath. He had missed dialysis on Monday due to a court date for driving ticket. Feels little better. Ate his breakfast this morning denies chest pain.   REVIEW OF SYSTEMS:   Review of Systems  Constitutional: Negative for chills, fever and weight loss.  HENT: Negative for ear discharge, ear pain and nosebleeds.   Eyes: Negative for blurred vision, pain and discharge.  Respiratory: Positive for shortness of breath. Negative for sputum production, wheezing and stridor.   Cardiovascular: Negative for chest pain, palpitations, orthopnea and PND.  Gastrointestinal: Negative for abdominal pain, diarrhea, nausea and vomiting.  Genitourinary: Negative for frequency and urgency.  Musculoskeletal: Negative for back pain and joint pain.  Neurological: Negative for sensory change, speech change, focal weakness and weakness.  Psychiatric/Behavioral: Negative for depression and hallucinations. The patient is not nervous/anxious.    Tolerating Diet:yes Tolerating PT: patient is ambulatory.  DRUG ALLERGIES:  No Known Allergies  VITALS:  Blood pressure (!) 153/100, pulse 95, temperature 97.6 F (36.4 C), temperature source Oral, resp. rate 20, height 5\' 4"  (1.626 m), weight 80.7 kg, SpO2 96 %.  PHYSICAL EXAMINATION:   Physical Exam  GENERAL:  54 y.o.-year-old patient lying in the bed with no acute distress. Obese EYES: Pupils equal, round, reactive to light and accommodation. No scleral icterus.   HEENT: Head atraumatic, normocephalic. Oropharynx and nasopharynx clear.  NECK:  Supple, no jugular venous distention. No thyroid enlargement, no tenderness.  LUNGS: decreased breath sounds bilaterally, no wheezing, rales, rhonchi. No use of accessory muscles of respiration.  CARDIOVASCULAR: S1, S2 normal.  No murmurs, rubs, or gallops.  ABDOMEN: Soft, nontender, nondistended. Bowel sounds present. No organomegaly or mass. Abdominal obesity EXTREMITIES: No cyanosis, clubbing or edema b/l.    NEUROLOGIC: Cranial nerves II through XII are intact. No focal Motor or sensory deficits b/l.   PSYCHIATRIC:  patient is alert and oriented x 3.  SKIN: No obvious rash, lesion, or ulcer.   LABORATORY PANEL:  CBC Recent Labs  Lab 02/01/20 0416  WBC 7.5  HGB 7.6*  HCT 25.5*  PLT 129*    Chemistries  Recent Labs  Lab 02/01/20 0416  NA 140  K 4.8  CL 101  CO2 23  GLUCOSE 142*  BUN 70*  CREATININE 14.57*  CALCIUM 8.9  AST 15  ALT 12  ALKPHOS 104  BILITOT 1.5*   Cardiac Enzymes No results for input(s): TROPONINI in the last 168 hours. RADIOLOGY:  DG Chest Portable 1 View  Result Date: 01/31/2020 CLINICAL DATA:  Shortness of breath EXAM: PORTABLE CHEST 1 VIEW COMPARISON:  Jan 24, 2020 FINDINGS: There is unchanged mild cardiomegaly. There is prominence of the central pulmonary vasculature with mildly increased left airspace opacities seen throughout both, increased from the prior exam. No pleural effusion. No acute osseous abnormality. IMPRESSION: Cardiomegaly and increased diffuse bilateral airspace opacities, likely pulmonary edema, however infectious etiology. Electronically Signed   By: Prudencio Pair M.D.   On: 01/31/2020 21:43   ASSESSMENT AND PLAN:  [name] is a 54 y.o. male with medical history significant of end-stage renal disease on hemodialysis Mondays Wednesdays Fridays, COPD, previous alcohol abuse, hypertension, hyperlipidemia who has been noncompliant with his hemodialysis.  Patient apparently missed hemodialysis yesterday.  He presented to the ER with shortness of breath cough  and tachypnea.  Patient was found to be in fluid overload  #1 acute hypoxic respiratory failure secondary to volume overload/pulmonary edema: Secondary to missed dialysis and end-stage renal disease.   -   Placed on oxygen.  -Spoke with nephrology Dr. Zollie Scale to get patient dialyze - wean to room air. Patient does not wear oxygen on a chronic basis -patient has history of noncompliance to his meds and maintaining dialysis schedule. -He tells me he had a court date and missed dialysis yesterday  #2 end-stage renal disease: Hemodialysis Mondays Wednesdays and Fridays.  Patient to be dialyzed today  #3 essential hypertension: Has hypertensive urgency at the moment.   -patient is not on any blood pressure meds at home. -Will start on Norvasc 10 mg daily -PRN IV hydralazine.  #4 anemia of chronic disease: Continue to monitor H&H.  #5 medication noncompliance: Counseling provided   DVT prophylaxis: Heparin Code Status: Full code Family Communication: No family at bedside Disposition Plan: Home Consults called: Dr. Holley Raring nephrology Admission status: Observation  Severity of Illness: after dialysis if hemodynamically stable patient should be able to discharge home.   TOTAL TIME TAKING CARE OF THIS PATIENT: *25* minutes.  >50% time spent on counselling and coordination of care  Note: This dictation was prepared with Dragon dictation along with smaller phrase technology. Any transcriptional errors that result from this process are unintentional.  Fritzi Mandes M.D    [facility]   CC: Primary care physician; Jearld Fenton, NPPatient ID: Jordan [name], male   DOB: 1966-04-06, 54 y.o.   MRN: [id number]

## 2020-02-01 NOTE — Plan of Care (Signed)
  Problem: Health Behavior/Discharge Planning: Goal: Ability to manage health-related needs will improve Outcome: Progressing   Problem: Clinical Measurements: Goal: Will remain free from infection Outcome: Progressing Goal: Diagnostic test results will improve Outcome: Progressing   Problem: Activity: Goal: Risk for activity intolerance will decrease Outcome: Progressing   Problem: Nutrition: Goal: Adequate nutrition will be maintained Outcome: Progressing   Problem: Coping: Goal: Level of anxiety will decrease Outcome: Progressing   Problem: Elimination: Goal: Will not experience complications related to bowel motility Outcome: Progressing   Problem: Pain Managment: Goal: General experience of comfort will improve Outcome: Progressing   Problem: Safety: Goal: Ability to remain free from injury will improve Outcome: Progressing   Problem: Skin Integrity: Goal: Risk for impaired skin integrity will decrease Outcome: Progressing

## 2020-02-01 NOTE — Progress Notes (Signed)
[facility]  ROUNDING NOTE   Subjective:  Patient well-known to [place] from prior admissions. Came in with elevated blood pressure, shortness of breath, and pulmonary edema. Currently receiving dialysis.  Objective:  Vital signs in last 24 hours:  Temp:  [97.6 F (36.4 C)-98.3 F (36.8 C)] 97.7 F (36.5 C) (05/12 1150) Pulse Rate:  [87-105] 104 (05/12 1330) Resp:  [20-27] 21 (05/12 1330) BP: (141-178)/(88-111) 167/100 (05/12 1330) SpO2:  [81 %-100 %] 99 % (05/12 1330) Weight:  [72.6 kg-80.7 kg] 80.7 kg (05/12 0150)  Weight change:  Filed Weights   01/31/20 2119 02/01/20 0150  Weight: 72.6 kg 80.7 kg    Intake/Output: No intake/output data recorded.   Intake/Output this shift:  Total I/O In: 120 [P.O.:120] Out: -   Physical Exam: General: No acute distress  Head: Normocephalic, atraumatic. Moist oral mucosal membranes  Eyes: Anicteric  Neck: Supple, trachea midline  Lungs:   Basilar rales, normal effort  Heart: S1S2 no rubs  Abdomen:  Soft, nontender, bowel sounds present  Extremities: 1+ peripheral edema.  Neurologic: Awake, alert, following commands  Skin: No lesions  Access: Left upper extremity AV fistula    Basic Metabolic Panel: Recent Labs  Lab 01/31/20 2123 02/01/20 0416  NA 144 140  K 4.7 4.8  CL 101 101  CO2 25 23  GLUCOSE 79 142*  BUN 66* 70*  CREATININE 14.20* 14.57*  CALCIUM 9.1 8.9    Liver Function Tests: Recent Labs  Lab 01/31/20 2123 02/01/20 0416  AST 26 15  ALT 14 12  ALKPHOS 106 104  BILITOT 1.7* 1.5*  PROT 7.9 7.0  ALBUMIN 3.9 3.5   No results for input(s): LIPASE, AMYLASE in the last 168 hours. No results for input(s): AMMONIA in the last 168 hours.  CBC: Recent Labs  Lab 01/31/20 2123 02/01/20 0416  WBC 8.2 7.5  NEUTROABS 6.0  --   HGB 9.0* 7.6*  HCT 30.2* 25.5*  MCV 109.0* 108.5*  PLT 144* 129*    Cardiac Enzymes: No results for input(s): CKTOTAL, CKMB, CKMBINDEX, TROPONINI in the last 168  hours.  BNP: Invalid input(s): POCBNP  CBG: Recent Labs  Lab 01/26/20 0609 02/01/20 0748  GLUCAP 83 88    Microbiology: Results for orders placed or performed during the hospital encounter of 01/31/20  SARS Coronavirus 2 by RT PCR (hospital order, performed in [facility] hospital lab) Nasopharyngeal Nasopharyngeal Swab     Status: None   Collection Time: 01/31/20 10:15 PM   Specimen: Nasopharyngeal Swab  Result Value Ref Range Status   SARS Coronavirus 2 NEGATIVE NEGATIVE Final    Comment: (NOTE) SARS-CoV-2 target nucleic acids are NOT DETECTED. The SARS-CoV-2 RNA is generally detectable in upper and lower respiratory specimens during the acute phase of infection. The lowest concentration of SARS-CoV-2 viral copies this assay can detect is 250 copies / mL. A negative result does not preclude SARS-CoV-2 infection and should not be used as the sole basis for treatment or other patient management decisions.  A negative result may occur with improper specimen collection / handling, submission of specimen other than nasopharyngeal swab, presence of viral mutation(s) within the areas targeted by this assay, and inadequate number of viral copies (<250 copies / mL). A negative result must be combined with clinical observations, patient history, and epidemiological information. Fact Sheet for Patients:   StrictlyIdeas.no Fact Sheet for Healthcare Providers: BankingDealers.co.za This test is not yet approved or cleared  by the [place] FDA and has been  authorized for detection and/or diagnosis of SARS-CoV-2 by FDA under an Emergency Use Authorization (EUA).  This EUA will remain in effect (meaning this test can be used) for the duration of the COVID-19 declaration under Section 564(b)(1) of the Act, 21 U.S.C. section 360bbb-3(b)(1), unless the authorization is terminated or revoked sooner. Performed at [facility], [address] [place], [place] [address]   MRSA PCR Screening     Status: None   Collection Time: 02/01/20  2:26 AM   Specimen: Nasopharyngeal  Result Value Ref Range Status   MRSA by PCR NEGATIVE NEGATIVE Final    Comment:        The GeneXpert MRSA Assay (FDA approved for NASAL specimens only), is one component of a comprehensive MRSA colonization surveillance program. It is not intended to diagnose MRSA infection nor to guide or monitor treatment for MRSA infections. Performed at [facility], [address] [place], [place] [address]     Coagulation Studies: No results for input(s): LABPROT, INR in the last 72 hours.  Urinalysis: No results for input(s): COLORURINE, LABSPEC, PHURINE, GLUCOSEU, HGBUR, BILIRUBINUR, KETONESUR, PROTEINUR, UROBILINOGEN, NITRITE, LEUKOCYTESUR in the last 72 hours.  Invalid input(s): APPERANCEUR    Imaging: DG Chest Portable 1 View  Result Date: 01/31/2020 CLINICAL DATA:  Shortness of breath EXAM: PORTABLE CHEST 1 VIEW COMPARISON:  Jan 24, 2020 FINDINGS: There is unchanged mild cardiomegaly. There is prominence of the central pulmonary vasculature with mildly increased left airspace opacities seen throughout both, increased from the prior exam. No pleural effusion. No acute osseous abnormality. IMPRESSION: Cardiomegaly and increased diffuse bilateral airspace opacities, likely pulmonary edema, however infectious etiology. Electronically Signed   By: Prudencio Pair M.D.   On: 01/31/2020 21:43     Medications:    . amLODipine  10 mg Oral Daily  . Chlorhexidine Gluconate Cloth  6 each Topical Daily  . Chlorhexidine Gluconate Cloth  6 each Topical Q0600  . heparin  5,000 Units Subcutaneous Q8H   acetaminophen **OR** acetaminophen, calcium carbonate (dosed in mg elemental calcium), camphor-menthol **AND** hydrOXYzine, docusate sodium, feeding supplement (NEPRO CARB STEADY), hydrALAZINE, ipratropium-albuterol, ondansetron **OR** ondansetron  (ZOFRAN) IV, phenol, sorbitol, zolpidem  Assessment/ Plan:  54 y.o. male with past medical history of ESRD on HD MWF, hypertension, alcohol abuse, hepatitis B, anemia chronic kidney disease, secondary hyperparathyroidism who was admitted with shortness of breath and acute pulmonary edema.  [facility] MWF [facility] Left AVF  1.  Acute pulmonary edema.  Patient undergoing dialysis for volume removal given acute pulmonary edema.  Advised patient on importance of adherence with outpatient dialysis schedule.  2.  Hypertension.  Maintain the patient on amlodipine 10 mg p.o. daily.  3.  Anemia of chronic kidney disease.  Hemoglobin 7.6.  Patient to resume Epogen as an outpatient.  4.  Secondary hyperparathyroidism.  Continue to monitor bone mineral metabolism parameters as an outpatient.   LOS: 0 Isela Stantz 5/12/20211:44 PM

## 2020-02-03 ENCOUNTER — Emergency Department (HOSPITAL_COMMUNITY): Payer: Medicare Other

## 2020-02-03 ENCOUNTER — Other Ambulatory Visit: Payer: Self-pay

## 2020-02-03 ENCOUNTER — Encounter (HOSPITAL_COMMUNITY): Payer: Self-pay | Admitting: Emergency Medicine

## 2020-02-03 ENCOUNTER — Observation Stay (HOSPITAL_COMMUNITY)
Admission: EM | Admit: 2020-02-03 | Discharge: 2020-02-04 | Disposition: A | Discharge status: Home / Self Care | Payer: Medicare Other | Attending: Internal Medicine | Admitting: Internal Medicine

## 2020-02-03 DIAGNOSIS — Z5329 Procedure and treatment not carried out because of patient's decision for other reasons: Secondary | ICD-10-CM | POA: Diagnosis not present

## 2020-02-03 DIAGNOSIS — Z743 Need for continuous supervision: Secondary | ICD-10-CM | POA: Diagnosis not present

## 2020-02-03 DIAGNOSIS — N185 Chronic kidney disease, stage 5: Secondary | ICD-10-CM | POA: Diagnosis present

## 2020-02-03 DIAGNOSIS — F17210 Nicotine dependence, cigarettes, uncomplicated: Secondary | ICD-10-CM | POA: Diagnosis not present

## 2020-02-03 DIAGNOSIS — I1311 Hypertensive heart and chronic kidney disease without heart failure, with stage 5 chronic kidney disease, or end stage renal disease: Secondary | ICD-10-CM | POA: Diagnosis present

## 2020-02-03 DIAGNOSIS — I700 Atherosclerosis of aorta: Secondary | ICD-10-CM | POA: Insufficient documentation

## 2020-02-03 DIAGNOSIS — I132 Hypertensive heart and chronic kidney disease with heart failure and with stage 5 chronic kidney disease, or end stage renal disease: Secondary | ICD-10-CM | POA: Diagnosis not present

## 2020-02-03 DIAGNOSIS — I10 Essential (primary) hypertension: Secondary | ICD-10-CM | POA: Diagnosis present

## 2020-02-03 DIAGNOSIS — R609 Edema, unspecified: Secondary | ICD-10-CM | POA: Diagnosis not present

## 2020-02-03 DIAGNOSIS — N186 End stage renal disease: Secondary | ICD-10-CM | POA: Diagnosis not present

## 2020-02-03 DIAGNOSIS — R0602 Shortness of breath: Secondary | ICD-10-CM

## 2020-02-03 DIAGNOSIS — Z79899 Other long term (current) drug therapy: Secondary | ICD-10-CM | POA: Insufficient documentation

## 2020-02-03 DIAGNOSIS — R0603 Acute respiratory distress: Secondary | ICD-10-CM | POA: Diagnosis not present

## 2020-02-03 DIAGNOSIS — E8770 Fluid overload, unspecified: Secondary | ICD-10-CM | POA: Diagnosis present

## 2020-02-03 DIAGNOSIS — R0902 Hypoxemia: Secondary | ICD-10-CM | POA: Diagnosis not present

## 2020-02-03 DIAGNOSIS — I5043 Acute on chronic combined systolic (congestive) and diastolic (congestive) heart failure: Secondary | ICD-10-CM | POA: Diagnosis not present

## 2020-02-03 DIAGNOSIS — J439 Emphysema, unspecified: Secondary | ICD-10-CM | POA: Diagnosis not present

## 2020-02-03 DIAGNOSIS — Z9115 Patient's noncompliance with renal dialysis: Secondary | ICD-10-CM | POA: Diagnosis not present

## 2020-02-03 DIAGNOSIS — Z20822 Contact with and (suspected) exposure to covid-19: Secondary | ICD-10-CM | POA: Diagnosis not present

## 2020-02-03 DIAGNOSIS — F1010 Alcohol abuse, uncomplicated: Secondary | ICD-10-CM | POA: Insufficient documentation

## 2020-02-03 DIAGNOSIS — I5042 Chronic combined systolic (congestive) and diastolic (congestive) heart failure: Secondary | ICD-10-CM | POA: Diagnosis present

## 2020-02-03 DIAGNOSIS — R1909 Other intra-abdominal and pelvic swelling, mass and lump: Secondary | ICD-10-CM

## 2020-02-03 DIAGNOSIS — Z992 Dependence on renal dialysis: Secondary | ICD-10-CM | POA: Diagnosis not present

## 2020-02-03 DIAGNOSIS — D631 Anemia in chronic kidney disease: Secondary | ICD-10-CM | POA: Diagnosis present

## 2020-02-03 LAB — RENAL FUNCTION PANEL
Albumin: 3.4 g/dL — ABNORMAL LOW (ref 3.5–5.0)
Anion gap: 16 — ABNORMAL HIGH (ref 5–15)
BUN: 56 mg/dL — ABNORMAL HIGH (ref 6–20)
CO2: 26 mmol/L (ref 22–32)
Calcium: 9.6 mg/dL (ref 8.9–10.3)
Chloride: 101 mmol/L (ref 98–111)
Creatinine, Ser: 12.6 mg/dL — ABNORMAL HIGH (ref 0.61–1.24)
GFR calc Af Amer: 5 mL/min — ABNORMAL LOW (ref >60)
GFR calc non Af Amer: 4 mL/min — ABNORMAL LOW (ref >60)
Glucose, Bld: 108 mg/dL — ABNORMAL HIGH (ref 70–99)
Phosphorus: 5.8 mg/dL — ABNORMAL HIGH (ref 2.5–4.6)
Potassium: 4.8 mmol/L (ref 3.5–5.1)
Sodium: 143 mmol/L (ref 135–145)

## 2020-02-03 LAB — CBC WITH DIFFERENTIAL/PLATELET
Abs Immature Granulocytes: 0 10*3/uL (ref 0.00–0.07)
Basophils Absolute: 0 10*3/uL (ref 0.0–0.1)
Basophils Relative: 0 %
Eosinophils Absolute: 0.3 10*3/uL (ref 0.0–0.5)
Eosinophils Relative: 4 %
HCT: 29.5 % — ABNORMAL LOW (ref 39.0–52.0)
Hemoglobin: 8.6 g/dL — ABNORMAL LOW (ref 13.0–17.0)
Lymphocytes Relative: 10 %
Lymphs Abs: 0.8 10*3/uL (ref 0.7–4.0)
MCH: 32.1 pg (ref 26.0–34.0)
MCHC: 29.2 g/dL — ABNORMAL LOW (ref 30.0–36.0)
MCV: 110.1 fL — ABNORMAL HIGH (ref 80.0–100.0)
Monocytes Absolute: 0.3 10*3/uL (ref 0.1–1.0)
Monocytes Relative: 4 %
Neutro Abs: 6.2 10*3/uL (ref 1.7–7.7)
Neutrophils Relative %: 82 %
Platelets: 140 10*3/uL — ABNORMAL LOW (ref 150–400)
RBC: 2.68 MIL/uL — ABNORMAL LOW (ref 4.22–5.81)
RDW: 15.7 % — ABNORMAL HIGH (ref 11.5–15.5)
WBC: 7.5 10*3/uL (ref 4.0–10.5)
nRBC: 0 % (ref 0.0–0.2)
nRBC: 0 /100 WBC

## 2020-02-03 LAB — TSH: TSH: 1.073 u[IU]/mL (ref 0.350–4.500)

## 2020-02-03 LAB — BASIC METABOLIC PANEL
Anion gap: 16 — ABNORMAL HIGH (ref 5–15)
BUN: 50 mg/dL — ABNORMAL HIGH (ref 6–20)
CO2: 27 mmol/L (ref 22–32)
Calcium: 9.7 mg/dL (ref 8.9–10.3)
Chloride: 102 mmol/L (ref 98–111)
Creatinine, Ser: 12.06 mg/dL — ABNORMAL HIGH (ref 0.61–1.24)
GFR calc Af Amer: 5 mL/min — ABNORMAL LOW (ref >60)
GFR calc non Af Amer: 4 mL/min — ABNORMAL LOW (ref >60)
Glucose, Bld: 84 mg/dL (ref 70–99)
Potassium: 4.5 mmol/L (ref 3.5–5.1)
Sodium: 145 mmol/L (ref 135–145)

## 2020-02-03 LAB — SARS CORONAVIRUS 2 BY RT PCR (HOSPITAL ORDER, PERFORMED IN ~~LOC~~ HOSPITAL LAB): SARS Coronavirus 2: NEGATIVE

## 2020-02-03 LAB — MAGNESIUM: Magnesium: 2.1 mg/dL (ref 1.7–2.4)

## 2020-02-03 LAB — BRAIN NATRIURETIC PEPTIDE: B Natriuretic Peptide: 3674.7 pg/mL — ABNORMAL HIGH (ref 0.0–100.0)

## 2020-02-03 LAB — TROPONIN I (HIGH SENSITIVITY): Troponin I (High Sensitivity): 71 ng/L — ABNORMAL HIGH (ref <18)

## 2020-02-03 IMAGING — DX DG CHEST 1V PORT
1 series · 1 of 1 positions shown · non-contrast
Comparison: Chest x-ray dated [DATE].

CLINICAL DATA: Shortness of breath.

EXAM:
PORTABLE CHEST 1 VIEW

[chest ap]
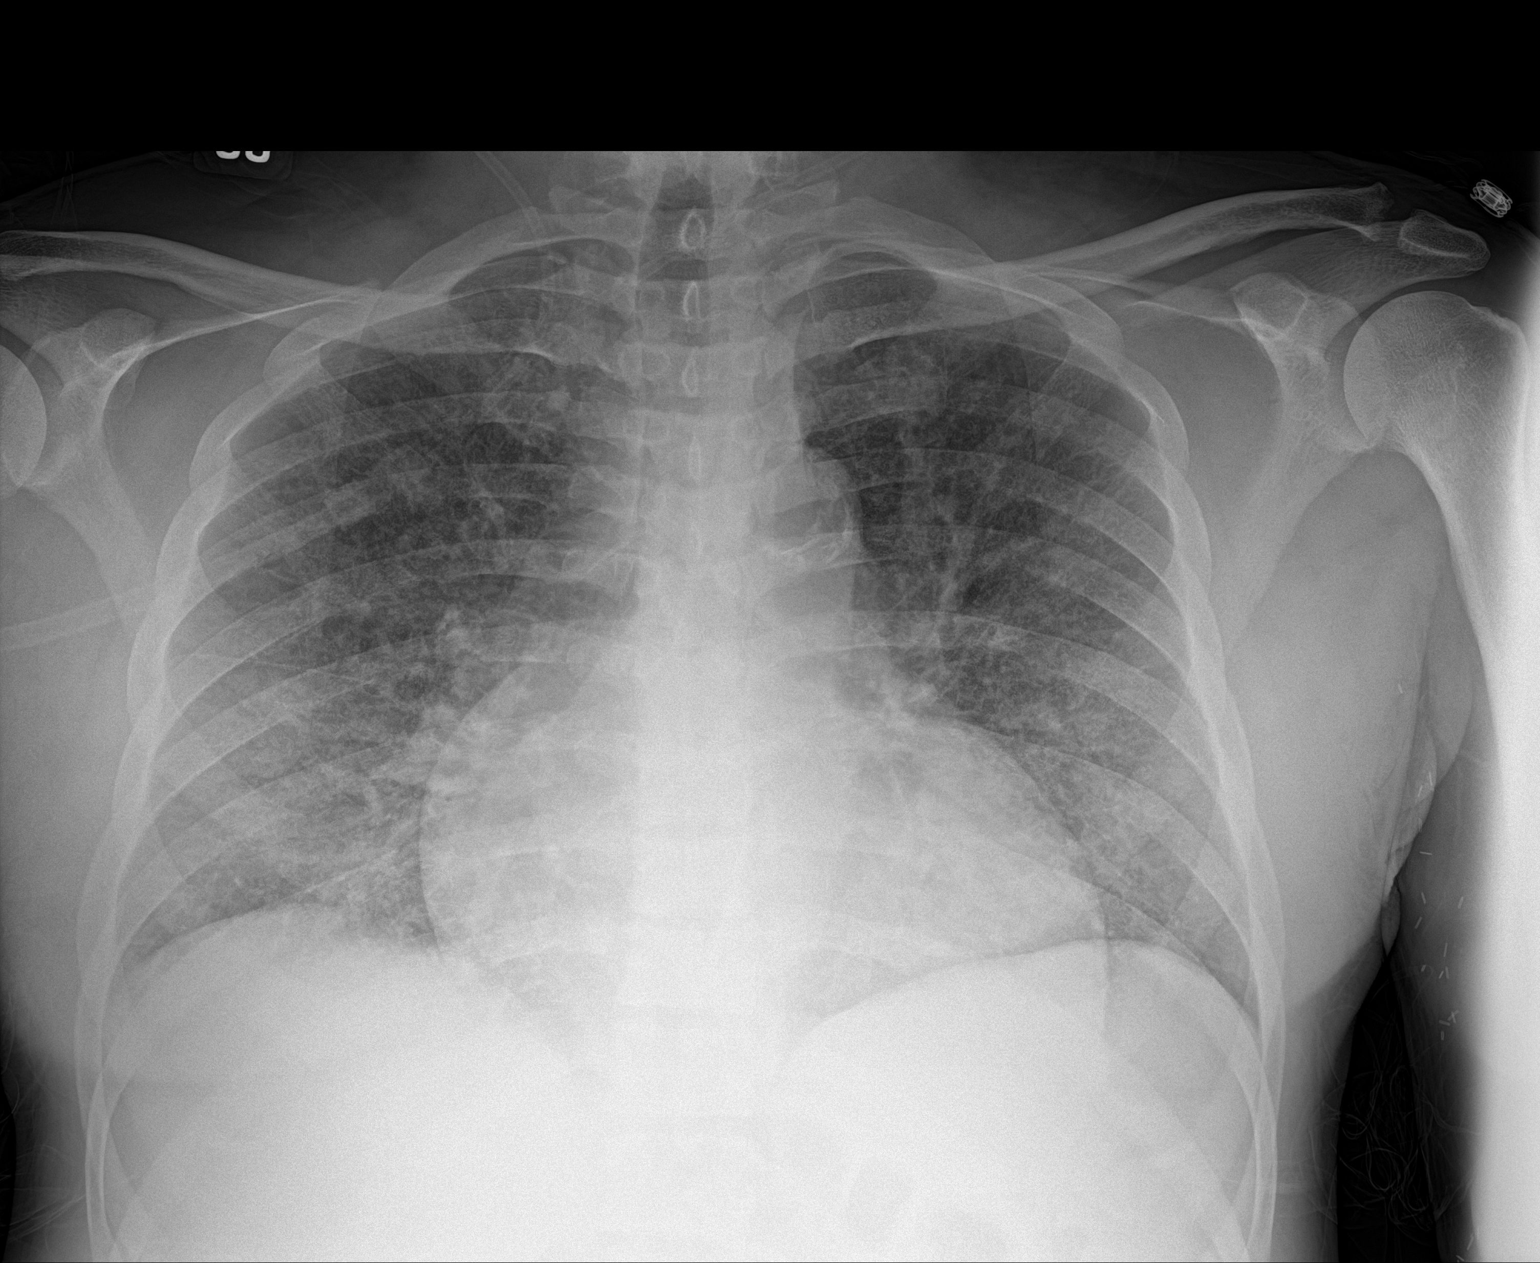

[1 of 1 positions shown; findings below may reference images not displayed]

FINDINGS: Unchanged mild cardiomegaly with diffuse interstitial thickening. No
focal consolidation, pleural effusion, or pneumothorax. No acute
osseous abnormality.
IMPRESSION: Unchanged interstitial pulmonary edema.

## 2020-02-03 MED ORDER — ONDANSETRON HCL 4 MG PO TABS: 4.0000 mg | ORAL_TABLET | Freq: Four times a day (QID) | ORAL | Status: DC | PRN

## 2020-02-03 MED ORDER — CALCITRIOL 0.5 MCG PO CAPS: 0.5000 ug | ORAL_CAPSULE | ORAL | Status: DC

## 2020-02-03 MED ORDER — CAMPHOR-MENTHOL 0.5-0.5 % EX LOTN
1.0000 "application " | TOPICAL_LOTION | CUTANEOUS | Status: DC | PRN
  Filled 2020-02-03: qty 222

## 2020-02-03 MED ORDER — HEPARIN SODIUM (PORCINE) 1000 UNIT/ML DIALYSIS
1000.0000 [IU] | INTRAMUSCULAR | Status: DC | PRN
  Filled 2020-02-03: qty 1

## 2020-02-03 MED ORDER — ACETAMINOPHEN 500 MG PO TABS: 1000.0000 mg | ORAL_TABLET | Freq: Four times a day (QID) | ORAL | Status: DC | PRN

## 2020-02-03 MED ORDER — SODIUM CHLORIDE 0.9 % IV SOLN: 100.0000 mL | INTRAVENOUS | Status: DC | PRN

## 2020-02-03 MED ORDER — SODIUM CHLORIDE 0.9 % IV SOLN: 250.0000 mL | INTRAVENOUS | Status: DC | PRN

## 2020-02-03 MED ORDER — NICOTINE 14 MG/24HR TD PT24
14.0000 mg | MEDICATED_PATCH | Freq: Every day | TRANSDERMAL | Status: DC
  Administered 2020-02-03: 14 mg via TRANSDERMAL
  Filled 2020-02-03: qty 1

## 2020-02-03 MED ORDER — ALBUTEROL SULFATE (2.5 MG/3ML) 0.083% IN NEBU: 2.5000 mg | INHALATION_SOLUTION | RESPIRATORY_TRACT | Status: DC | PRN

## 2020-02-03 MED ORDER — HEPARIN SODIUM (PORCINE) 5000 UNIT/ML IJ SOLN
5000.0000 [IU] | Freq: Three times a day (TID) | INTRAMUSCULAR | Status: DC
  Administered 2020-02-03 (×2): 5000 [IU] via SUBCUTANEOUS
  Filled 2020-02-03 (×2): qty 1

## 2020-02-03 MED ORDER — SODIUM CHLORIDE 0.9% FLUSH
3.0000 mL | Freq: Two times a day (BID) | INTRAVENOUS | Status: DC
  Administered 2020-02-03 (×2): 3 mL via INTRAVENOUS

## 2020-02-03 MED ORDER — KETOROLAC TROMETHAMINE 15 MG/ML IJ SOLN: 15.0000 mg | Freq: Four times a day (QID) | INTRAMUSCULAR | Status: DC | PRN

## 2020-02-03 MED ORDER — TRAZODONE HCL 50 MG PO TABS: 50.0000 mg | ORAL_TABLET | Freq: Every evening | ORAL | Status: DC | PRN

## 2020-02-03 MED ORDER — CALCIUM ACETATE (PHOS BINDER) 667 MG PO CAPS
2001.0000 mg | ORAL_CAPSULE | Freq: Three times a day (TID) | ORAL | Status: DC
  Administered 2020-02-03 – 2020-02-04 (×2): 2001 mg via ORAL
  Filled 2020-02-03 (×3): qty 3

## 2020-02-03 MED ORDER — HYDRALAZINE HCL 25 MG PO TABS
25.0000 mg | ORAL_TABLET | Freq: Four times a day (QID) | ORAL | Status: DC | PRN
  Administered 2020-02-03: 25 mg via ORAL
  Filled 2020-02-03: qty 1

## 2020-02-03 MED ORDER — CHLORHEXIDINE GLUCONATE CLOTH 2 % EX PADS: 6.0000 | MEDICATED_PAD | Freq: Every day | CUTANEOUS | Status: DC

## 2020-02-03 MED ORDER — SODIUM CHLORIDE 0.9% FLUSH: 3.0000 mL | INTRAVENOUS | Status: DC | PRN

## 2020-02-03 MED ORDER — POLYETHYLENE GLYCOL 3350 17 G PO PACK: 17.0000 g | PACK | Freq: Every day | ORAL | Status: DC | PRN

## 2020-02-03 MED ORDER — LIDOCAINE-PRILOCAINE 2.5-2.5 % EX CREA
1.0000 "application " | TOPICAL_CREAM | CUTANEOUS | Status: DC | PRN
  Filled 2020-02-03: qty 5

## 2020-02-03 MED ORDER — FUROSEMIDE 10 MG/ML IJ SOLN
120.0000 mg | Freq: Once | INTRAVENOUS | Status: AC
  Administered 2020-02-03: 120 mg via INTRAVENOUS
  Filled 2020-02-03: qty 10

## 2020-02-03 MED ORDER — ALBUTEROL SULFATE HFA 108 (90 BASE) MCG/ACT IN AERS: 2.0000 | INHALATION_SPRAY | RESPIRATORY_TRACT | Status: DC | PRN

## 2020-02-03 MED ORDER — TRIAMCINOLONE ACETONIDE 0.1 % EX CREA
1.0000 "application " | TOPICAL_CREAM | Freq: Two times a day (BID) | CUTANEOUS | Status: DC | PRN
  Administered 2020-02-04: 1 via TOPICAL
  Filled 2020-02-03 (×2): qty 15

## 2020-02-03 MED ORDER — ALTEPLASE 2 MG IJ SOLR: 2.0000 mg | Freq: Once | INTRAMUSCULAR | Status: DC | PRN

## 2020-02-03 MED ORDER — ONDANSETRON HCL 4 MG/2ML IJ SOLN: 4.0000 mg | Freq: Four times a day (QID) | INTRAMUSCULAR | Status: DC | PRN

## 2020-02-03 MED ORDER — HYDROXYZINE HCL 25 MG PO TABS
50.0000 mg | ORAL_TABLET | Freq: Three times a day (TID) | ORAL | Status: DC | PRN
  Administered 2020-02-04: 50 mg via ORAL
  Filled 2020-02-03: qty 2

## 2020-02-03 MED ORDER — LIDOCAINE HCL (PF) 1 % IJ SOLN: 5.0000 mL | INTRAMUSCULAR | Status: DC | PRN

## 2020-02-03 MED ORDER — PENTAFLUOROPROP-TETRAFLUOROETH EX AERO
1.0000 "application " | INHALATION_SPRAY | CUTANEOUS | Status: DC | PRN
  Filled 2020-02-03: qty 116

## 2020-02-03 NOTE — ED Provider Notes (Signed)
[facility]ERGENCY DEPARTMENT Provider Note   CSN: [id number] Arrival date & time: 02/03/20  5329     History Chief Complaint  Patient presents with  . Genital Swelling    Jordan [name] is a 54 y.o. male.  54 y.o male with a PMH of CKD5, anemia, EtOH abuse presents to the ED with a chief complaint of genital swelling for the past 2 days.  Patient reports he currently attends dialysis Monday, Wednesday, Friday, last dialyzed on Wednesday.  Reports he has had ongoing swelling for the past 2 days.  States he voices concern to dialysis team, was told that this was likely due to fluid and should resolve however has failed to do so.  He also endorses shortness of breath, currently on supplemental oxygen as needed at home, states he has been feeling more short of breath with exertion lately.  He has not been running fevers, chest pain, other complaints. Of note, patient does still produce urine.  The history is provided by the patient and medical records.       Past Medical History:  Diagnosis Date  . Anemia   . Aortic atherosclerosis ([place]) 11/12/2019  . CKD (chronic kidney disease)    Stage 5  Dialysis - M/W/F in [place], [place]  . Dyspnea    tx with inhaler when sick  . ED (erectile dysfunction)   . Emphysema of lung ([place]) 11/12/2019  . ETOH abuse   . History of blood transfusion   . Hypertension   . Wears dentures     Patient Active Problem List   Diagnosis Date Noted  . Pulmonary edema 01/31/2020  . Syncopal episodes 01/24/2020  . Macrocytic anemia 01/24/2020  . Hyperkalemia 01/24/2020  . Fluid overload 11/12/2019  . Acute respiratory failure with hypoxia ([place]) 11/12/2019  . Aortic atherosclerosis ([place]) 11/12/2019  . Emphysema of lung ([place]) 11/12/2019  . Acute pulmonary edema ([place]) 10/31/2019  . Acute on chronic congestive heart failure ([place]) 10/22/2019  . Acute respiratory distress 10/06/2019  . Volume overload 10/05/2019  . Hypokalemia  10/05/2019  . ESRD (end stage renal disease) ([place]) 10/01/2019  . Leg mass, right   . Hyperbilirubinemia   . Symptomatic anemia 09/19/2019  . Pruritus 09/19/2019  . Hemoptysis 02/11/2019  . Hypercalcemia 01/19/2019  . Anemia in ESRD (end-stage renal disease) ([place]) 05/17/2018  . Hepatitis B 03/19/2018  . ESRD on dialysis ([place]) 05/26/2017  . Essential hypertension 05/26/2017  . Urinary tract infection, site not specified 10/17/2016  . Coagulation defect, unspecified ([place]) 08/13/2016  . Diarrhea, unspecified 08/13/2016  . Encounter for immunization 08/13/2016  . Headache, unspecified 08/13/2016  . Hypertensive chronic kidney disease with stage 5 chronic kidney disease or end stage renal disease ([place]) 08/13/2016  . Iron deficiency anemia, unspecified 08/13/2016  . Pain, unspecified 08/13/2016  . Moderate protein-calorie malnutrition ([place]) 08/13/2016  . Secondary hyperparathyroidism of renal origin ([place]) 08/13/2016    Past Surgical History:  Procedure Laterality Date  . BASCILIC VEIN TRANSPOSITION Left 08/07/2016   Procedure: LEFT BASILIC VEIN TRANSPOSITION;  Surgeon: Angelia Mould, MD;  Location: [facility];  Service: Vascular;  Laterality: Left;  . CLOSED REDUCTION NASAL FRACTURE N/A 08/11/2019   Procedure: CLOSED REDUCTION NASAL FRACTURE WITH STABILIZATION;  Surgeon: Irene Limbo, MD;  Location: [facility];  Service: Plastics;  Laterality: N/A;  . COLONOSCOPY N/A 08/12/2016   Procedure: COLONOSCOPY;  Surgeon: Otis Brace, MD;  Location: [facility];  Service: Gastroenterology;  Laterality: N/A;  . ESOPHAGOGASTRODUODENOSCOPY N/A 08/12/2016  Procedure: ESOPHAGOGASTRODUODENOSCOPY (EGD);  Surgeon: Otis Brace, MD;  Location: [facility];  Service: Gastroenterology;  Laterality: N/A;  . INSERTION OF DIALYSIS CATHETER N/A 08/07/2016   Procedure: INSERTION OF TUNNELED DIALYSIS CATHETER;  Surgeon: Angelia Mould, MD;  Location: [facility];  Service: Vascular;  Laterality:  N/A;  . LIGATION OF ARTERIOVENOUS  FISTULA Left 09/12/2016   Procedure: BANDING OF LEFT  ARTERIOVENOUS  FISTULA;  Surgeon: Angelia Mould, MD;  Location: [facility];  Service: Vascular;  Laterality: Left;  . LOWER EXTREMITY INTERVENTION Right 12/02/2018   Procedure: LOWER EXTREMITY INTERVENTION;  Surgeon: Algernon Huxley, MD;  Location: [facility];  Service: Cardiovascular;  Laterality: Right;       Family History  Problem Relation Age of Onset  . Diabetes Mother   . Kidney failure Mother   . Healthy Father   . Kidney failure Brother   . Healthy Sister   . Kidney disease Daughter   . Post-traumatic stress disorder Neg Hx   . Bladder Cancer Neg Hx   . Kidney cancer Neg Hx     Social History   Tobacco Use  . Smoking status: Current Every Day Smoker    Packs/day: 0.50    Years: 40.00    Pack years: 20.00    Types: Cigarettes  . Smokeless tobacco: Never Used  Substance Use Topics  . Alcohol use: Yes    Alcohol/week: 21.0 standard drinks    Types: 21 Cans of beer per week    Comment: none since 08/04/16  . Drug use: No    Home Medications Prior to Admission medications   Medication Sig Start Date End Date Taking? Authorizing Provider  acetaminophen (TYLENOL) 500 MG tablet Take 1,000 mg by mouth every 6 (six) hours as needed for mild pain or fever.  03/09/19 03/07/20 Yes [provider]  albuterol (PROVENTIL HFA;VENTOLIN HFA) 108 (90 Base) MCG/ACT inhaler Inhale 2 puffs into the lungs every 4 (four) hours as needed for wheezing or shortness of breath. 10/03/18  Yes Orpah Greek, MD  calcitRIOL (ROCALTROL) 0.5 MCG capsule Take 1 capsule (0.5 mcg total) by mouth every Monday, Wednesday, and Friday with hemodialysis. 10/24/19  Yes Pokhrel, Laxman, MD  calcium acetate (PHOSLO) 667 MG capsule Take 2 capsules (1,334 mg total) by mouth 3 (three) times daily with meals. Patient taking differently: Take 2,001 mg by mouth 3 (three) times daily with meals.  02/12/19   Yes Fritzi Mandes, MD  camphor-menthol [facility]) lotion Apply 1 application topically as needed for itching. 11/23/19  Yes Upstill, Nehemiah Settle, PA-C  hydrOXYzine (ATARAX/VISTARIL) 50 MG tablet Take 50 mg by mouth 3 (three) times daily as needed for anxiety or itching.  08/09/19  Yes [provider]  triamcinolone cream (KENALOG) 0.1 % Apply 1 application topically 2 (two) times daily. Patient taking differently: Apply 1 application topically 2 (two) times daily as needed (itching).  11/23/19  Yes Charlann Lange, PA-C    Allergies    Patient has no known allergies.  Review of Systems   Review of Systems  Constitutional: Negative for fever.  HENT: Negative for sore throat.   Respiratory: Positive for shortness of breath.   Cardiovascular: Positive for leg swelling. Negative for chest pain.  Gastrointestinal: Negative for abdominal pain, nausea and vomiting.  Genitourinary: Negative for flank pain.  Musculoskeletal: Negative for back pain.  Neurological: Negative for light-headedness and headaches.  All other systems reviewed and are negative.   Physical Exam Updated Vital Signs BP (!) 163/87  Pulse (!) 106   Temp 98.4 F (36.9 C) (Oral)   Resp (!) 23   Ht 5\' 4"  (1.626 m)   Wt 80 kg   SpO2 94%   BMI 30.27 kg/m   Physical Exam Vitals and nursing note reviewed. Exam conducted with a chaperone present.  Constitutional:      Appearance: Normal appearance.  HENT:     Head: Normocephalic and atraumatic.     Nose: Congestion present.     Mouth/Throat:     Mouth: Mucous membranes are moist.  Eyes:     Pupils: Pupils are equal, round, and reactive to light.  Cardiovascular:     Rate and Rhythm: Normal rate.     Pulses:          Dorsalis pedis pulses are 2+ on the right side and 2+ on the left side.       Posterior tibial pulses are 2+ on the right side and 2+ on the left side.     Comments: 1+ pitting edema bilaterally.  No calf tenderness. Pulmonary:     Breath sounds:  Examination of the right-upper field reveals rales. Examination of the right-lower field reveals rales. Rales present.  Abdominal:     General: Abdomen is flat.     Tenderness: There is no abdominal tenderness. There is no right CVA tenderness or left CVA tenderness.     Hernia: There is no hernia in the left inguinal area or right inguinal area.  Genitourinary:    Penis: Uncircumcised.      Testes:        Right: Swelling present. Tenderness not present.        Left: Swelling present. Tenderness not present.     Comments: Extensive swelling noted to the genital region bilaterally.  Not erythematous, no changes in skin, no streaking. Chaperoned by RN April. Musculoskeletal:     Cervical back: Normal range of motion and neck supple.     Right lower leg: 1+ Edema present.     Left lower leg: 1+ Edema present.  Lymphadenopathy:     Lower Body: Right inguinal adenopathy present. Left inguinal adenopathy present.  Skin:    General: Skin is warm and dry.  Neurological:     Mental Status: He is alert and oriented to person, place, and time.     ED Results / Procedures / Treatments   Labs (all labs ordered are listed, but only abnormal results are displayed) Labs Reviewed  CBC WITH DIFFERENTIAL/PLATELET - Abnormal; Notable for the following components:      Result Value   RBC 2.68 (*)    Hemoglobin 8.6 (*)    HCT 29.5 (*)    MCV 110.1 (*)    MCHC 29.2 (*)    RDW 15.7 (*)    Platelets 140 (*)    All other components within normal limits  BASIC METABOLIC PANEL - Abnormal; Notable for the following components:   BUN 50 (*)    Creatinine, Ser 12.06 (*)    GFR calc non Af Amer 4 (*)    GFR calc Af Amer 5 (*)    Anion gap 16 (*)    All other components within normal limits  SARS CORONAVIRUS 2 BY RT PCR (HOSPITAL ORDER, [facility])  URINALYSIS, ROUTINE W REFLEX MICROSCOPIC  BRAIN NATRIURETIC PEPTIDE  TROPONIN I (HIGH SENSITIVITY)     EKG None  Radiology DG Chest Portable 1 View  Result Date: 02/03/2020 CLINICAL DATA:  Shortness of breath. EXAM: PORTABLE CHEST 1 VIEW COMPARISON:  Chest x-ray dated Jan 31, 2020. FINDINGS: Unchanged mild cardiomegaly with diffuse interstitial thickening. No focal consolidation, pleural effusion, or pneumothorax. No acute osseous abnormality. IMPRESSION: Unchanged interstitial pulmonary edema. Electronically Signed   By: Titus Dubin M.D.   On: 02/03/2020 10:50    Procedures Procedures (including critical care time)  Medications Ordered in ED Medications  Chlorhexidine Gluconate Cloth 2 % PADS 6 each (has no administration in time range)  furosemide (LASIX) 120 mg in dextrose 5 % 50 mL IVPB (has no administration in time range)    ED Course  I have reviewed the triage vital signs and the nursing notes.  Pertinent labs & imaging results that were available during my care of the patient were reviewed by me and considered in my medical decision making (see chart for details).    MDM Rules/Calculators/A&P  Patient with a past medical history of CKD 5 presents to the ED with complaints of genital swelling along with shortness of breath for the past 2 days.  Does report having dialysis Monday, Wednesday, Friday, states he attempted to seek medical care however did not do so.  He is currently reporting increased swelling to his groin area, has not been running fevers, still makes urine and did report dysuria to triage however the not voices during our interview.  During evaluation there is rales noted to upper and lower right lung fields, there is extensive swelling to his groin area without signs of erythema, or tenderness to palpation.  1+ pitting edema to bilateral legs.  He does report shortness of breath, does have supplemental oxygen for occasional use, states he is felt more short of breath with exertion for the past 2 days.  Chest xray: Unchanged interstitial pulmonary  edema.  Interpretation of labs by me with a CBC without leukocytosis, globin is decreased but within his baseline.  BMP without any hyperkalemia, creatinine level is 12.06 within his baseline.  12:23 PM Spoke to Nephrology on call, who reported patient is in a nonurgent basis, will likely get dialyzed sometime today or tomorrow.  Extensive patient review of chart, patient had left AMA after his last visit at [facility].  Patient does not have oxygen at home, according to last progress note patient left without any oxygen need.  Did arrive in the ED with an oxygen saturation of 86%, I feel that is best for patient to be hospitalized due to new oxygen requirement and receive his dialysis treatment.  2:30 PM spoke to Dr. Evangeline Gula who recommended 120 IV Lasix along with Covid testing.  Patient will be admitted for observation and will need dialysis.   Portions of this note were generated with Lobbyist. Dictation errors may occur despite best attempts at proofreading.  Final Clinical Impression(s) / ED Diagnoses Final diagnoses:  Groin swelling  Shortness of breath    Rx / DC Orders ED Discharge Orders    None       Janeece Fitting, PA-C 02/03/20 1430    Tegeler, Gwenyth Allegra, MD 02/03/20 1635

## 2020-02-03 NOTE — Progress Notes (Signed)
[facility] Progress Note:  Jordan [name] 1966/02/15 [id number]  Nephrology aware of patient in the ED d/t SOB and scrotal edema.  Admitted to [facility] 5/4-5/6 and to [facility] 5/11-5/12 for same compliant, leaving AMA for both admission.  Patient is chronically non compliant with outpatient dialysis.  Orders written for dialysis today due to volume overload.  If no other reason for admission can d/c post HD.    OP Dialysis Orders: MWF [facility] 4h 450/800 68kg 2/2 bath LUA AVF Hep none - mircera 225 last 4/2 - venofer 50/wk - sensipar 90 tiw - calcitriol 1 ug  Jen Mow, PA-C [facility] Pager: [phone]

## 2020-02-03 NOTE — ED Notes (Signed)
Jordan [name], other [phone] would like an update.

## 2020-02-03 NOTE — ED Triage Notes (Signed)
Patient arrived with EMS from home reports genital swelling onset 2 days ago with dysuria , hemodialysis q Mon/Wed/Fri.

## 2020-02-03 NOTE — ED Notes (Signed)
Pt given food and drink.

## 2020-02-03 NOTE — ED Notes (Signed)
Per pt, is usually on 2L [place] at home. Pt was not on O2 and SpO2 was 86% on RA. Pt placed on 2L  and SpO2 currently 96%.

## 2020-02-03 NOTE — H&P (Signed)
History and Physical    Jordan [name] NTI:[id number] DOB: 22-Dec-1965 DOA: 02/03/2020  PCP: Jearld Fenton, NP  Patient coming from:   I have personally briefly reviewed patient's old medical records in [place]  Chief Complaint: Genital swelling  HPI: Jordan [name] is a 54 y.o. male with medical history significant of end-stage renal disease on hemodialysis who still makes urine, anemia, alcohol abuse, and a history of nonadherence to medical therapy who left AMA from [facility] on 512 after having been admitted on 511.  He also left AMA from [facility] on 5 6.  Nephrology PA reports that he is chronically noncompliant with outpatient dialysis.  He is currently in volume overload and orders have been written for dialysis today.  Patient complains of ongoing swelling for 2 days and he voices concern to the dialysis team previously.  He was told that his fluid should resolve however but it has failed to do so.  (Perhaps because he has not attended dialysis?)  He endorses shortness of breath and is currently on supplemental oxygen but left from  without an oxygen requirement.  He has been feeling more short of breath with exertion lately.  He denies any fevers, chills, nausea, vomiting, chest pain, loss of taste or smell or other complaints.  Patient does still produce urine.  He reports that he has not yet obtained a COVID-19 vaccination.  ED Course:   Patient hypoxic on arrival with an oxygen saturation of 86%.  He does not have oxygen at home.  CBC does not show any leukocytosis.  Globulin is decreased but within his baseline.  BMP without any hyperkalemia.  Creatinine level is 12.06 within his baseline.  Nephrology contacted who plan to dialyze the patient sometime today.  Given his extensive scrotal edema and hypoxemia he was referred to me for further evaluation and management.  Review of Systems: As per HPI otherwise all other systems reviewed and  negative.   Past  Medical History:  Diagnosis Date  . Anemia   . Aortic atherosclerosis ([place]) 11/12/2019  . CKD (chronic kidney disease)    Stage 5  Dialysis - M/W/F in [place], [place]  . Dyspnea    tx with inhaler when sick  . ED (erectile dysfunction)   . Emphysema of lung ([place]) 11/12/2019  . ETOH abuse   . History of blood transfusion   . Hypertension   . Wears dentures     Past Surgical History:  Procedure Laterality Date  . BASCILIC VEIN TRANSPOSITION Left 08/07/2016   Procedure: LEFT BASILIC VEIN TRANSPOSITION;  Surgeon: Angelia Mould, MD;  Location: [facility];  Service: Vascular;  Laterality: Left;  . CLOSED REDUCTION NASAL FRACTURE N/A 08/11/2019   Procedure: CLOSED REDUCTION NASAL FRACTURE WITH STABILIZATION;  Surgeon: Irene Limbo, MD;  Location: [place];  Service: Plastics;  Laterality: N/A;  . COLONOSCOPY N/A 08/12/2016   Procedure: COLONOSCOPY;  Surgeon: Otis Brace, MD;  Location: [facility];  Service: Gastroenterology;  Laterality: N/A;  . ESOPHAGOGASTRODUODENOSCOPY N/A 08/12/2016   Procedure: ESOPHAGOGASTRODUODENOSCOPY (EGD);  Surgeon: Otis Brace, MD;  Location: [facility];  Service: Gastroenterology;  Laterality: N/A;  . INSERTION OF DIALYSIS CATHETER N/A 08/07/2016   Procedure: INSERTION OF TUNNELED DIALYSIS CATHETER;  Surgeon: Angelia Mould, MD;  Location: [facility];  Service: Vascular;  Laterality: N/A;  . LIGATION OF ARTERIOVENOUS  FISTULA Left 09/12/2016   Procedure: BANDING OF LEFT  ARTERIOVENOUS  FISTULA;  Surgeon: Angelia Mould, MD;  Location: [facility];  Service: Vascular;  Laterality: Left;  . LOWER EXTREMITY INTERVENTION Right 12/02/2018   Procedure: LOWER EXTREMITY INTERVENTION;  Surgeon: Algernon Huxley, MD;  Location: [facility];  Service: Cardiovascular;  Laterality: Right;    Social History   Social History Narrative  . Not on file     reports that he has been smoking cigarettes. He has a 20.00 pack-year smoking history. He has  never used smokeless tobacco. He reports current alcohol use of about 21.0 standard drinks of alcohol per week. He reports that he does not use drugs.  No Known Allergies  Family History  Problem Relation Age of Onset  . Diabetes Mother   . Kidney failure Mother   . Healthy Father   . Kidney failure Brother   . Healthy Sister   . Kidney disease Daughter   . Post-traumatic stress disorder Neg Hx   . Bladder Cancer Neg Hx   . Kidney cancer Neg Hx      Prior to Admission medications   Medication Sig Start Date End Date Taking? Authorizing Provider  acetaminophen (TYLENOL) 500 MG tablet Take 1,000 mg by mouth every 6 (six) hours as needed for mild pain or fever.  03/09/19 03/07/20 Yes [provider]  albuterol (PROVENTIL HFA;VENTOLIN HFA) 108 (90 Base) MCG/ACT inhaler Inhale 2 puffs into the lungs every 4 (four) hours as needed for wheezing or shortness of breath. 10/03/18  Yes Orpah Greek, MD  calcitRIOL (ROCALTROL) 0.5 MCG capsule Take 1 capsule (0.5 mcg total) by mouth every Monday, Wednesday, and Friday with hemodialysis. 10/24/19  Yes Pokhrel, Laxman, MD  calcium acetate (PHOSLO) 667 MG capsule Take 2 capsules (1,334 mg total) by mouth 3 (three) times daily with meals. Patient taking differently: Take 2,001 mg by mouth 3 (three) times daily with meals.  02/12/19  Yes Fritzi Mandes, MD  camphor-menthol [facility]) lotion Apply 1 application topically as needed for itching. 11/23/19  Yes Upstill, Nehemiah Settle, PA-C  hydrOXYzine (ATARAX/VISTARIL) 50 MG tablet Take 50 mg by mouth 3 (three) times daily as needed for anxiety or itching.  08/09/19  Yes [provider]  triamcinolone cream (KENALOG) 0.1 % Apply 1 application topically 2 (two) times daily. Patient taking differently: Apply 1 application topically 2 (two) times daily as needed (itching).  11/23/19  Yes Charlann Lange, PA-C    Physical Exam:  Constitutional: NAD, calm, comfortable Vitals:   02/03/20 1219 02/03/20 1315  02/03/20 1330 02/03/20 1345  BP: (!) 156/92 (!) 163/87 (!) 150/78 (!) 156/95  Pulse: (!) 103 (!) 106 (!) 107 (!) 101  Resp:  (!) 23 20   Temp:      TempSrc:      SpO2: 92% 94% 95% 96%  Weight:      Height:       Eyes: PERRL, lids and conjunctivae normal ENMT: Mucous membranes are moist. Posterior pharynx clear of any exudate or lesions.Normal dentition.  Neck: normal, supple, no masses, no thyromegaly Respiratory: dec bs b/l with exp wheezing throughout, B/L  Crackles 1/2 way up. increased respiratory effort. No accessory muscle use.  Cardiovascular: Regular rate and rhythm, 2/6 SE murmur, no rubs / gallops. 2+extremity edema. 2+ pedal pulses. No carotid bruits. Scrotal edema Massive and difficult to wear clothing with it Abdomen: no tenderness, no masses palpated. No hepatosplenomegaly. Bowel sounds positive.  Musculoskeletal: no clubbing / cyanosis. No joint deformity upper and lower extremities. Good ROM, no contractures. Normal muscle tone.  Skin: no rashes, lesions, ulcers. No induration Neurologic:  CN 2-12 grossly intact. Sensation intact, DTR normal. Strength 5/5 in all 4.  Psychiatric: Normal judgment and insight. Alert and oriented x 3. Normal mood.    Labs on Admission: I have personally reviewed following labs and imaging studies  CBC: Recent Labs  Lab 01/31/20 2123 02/01/20 0416 02/03/20 0648  WBC 8.2 7.5 7.5  NEUTROABS 6.0  --  6.2  HGB 9.0* 7.6* 8.6*  HCT 30.2* 25.5* 29.5*  MCV 109.0* 108.5* 110.1*  PLT 144* 129* 163*   Basic Metabolic Panel: Recent Labs  Lab 01/31/20 2123 02/01/20 0416 02/03/20 0648  NA 144 140 145  K 4.7 4.8 4.5  CL 101 101 102  CO2 25 23 27   GLUCOSE 79 142* 84  BUN 66* 70* 50*  CREATININE 14.20* 14.57* 12.06*  CALCIUM 9.1 8.9 9.7   GFR: Estimated Creatinine Clearance: 6.8 mL/min (A) (by C-G formula based on SCr of 12.06 mg/dL (H)). Liver Function Tests: Recent Labs  Lab 01/31/20 2123 02/01/20 0416  AST 26 15  ALT 14 12   ALKPHOS 106 104  BILITOT 1.7* 1.5*  PROT 7.9 7.0  ALBUMIN 3.9 3.5   CBG: Recent Labs  Lab 02/01/20 0748  GLUCAP 88   Urine analysis:    Component Value Date/Time   COLORURINE YELLOW 01/24/2020 0904   APPEARANCEUR CLEAR 01/24/2020 0904   LABSPEC 1.012 01/24/2020 0904   PHURINE 9.0 (H) 01/24/2020 0904   GLUCOSEU 150 (A) 01/24/2020 0904   HGBUR SMALL (A) 01/24/2020 0904   BILIRUBINUR NEGATIVE 01/24/2020 0904   [place] 01/24/2020 0904   PROTEINUR 100 (A) 01/24/2020 0904   NITRITE NEGATIVE 01/24/2020 0904   LEUKOCYTESUR MODERATE (A) 01/24/2020 0904    Radiological Exams on Admission: DG Chest Portable 1 View  Result Date: 02/03/2020 CLINICAL DATA:  Shortness of breath. EXAM: PORTABLE CHEST 1 VIEW COMPARISON:  Chest x-ray dated Jan 31, 2020. FINDINGS: Unchanged mild cardiomegaly with diffuse interstitial thickening. No focal consolidation, pleural effusion, or pneumothorax. No acute osseous abnormality. IMPRESSION: Unchanged interstitial pulmonary edema. Electronically Signed   By: Titus Dubin M.D.   On: 02/03/2020 10:50    EKG: Independently reviewed.  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  Acute respiratory distress: Likely related to volume overload with hypoxemia.  Will provide oxygen supplementation and give some diuretic now for relief of acute hypoxemia.  Patient will need acute dialysis.  2.  Acute on chronic combined systolic and diastolic congestive heart failure with volume overload: Patient requires hemodialysis for effective removal of volume.  Nephrology has seen the patient and is planning for  dialysis today.  Patient appears to be at least 6 or 7 pounds above his dry weight.  3.  Essential hypertension: Blood pressures are currently daily likely due to volume overload from lack of clearance due to renal failure.  Continue home medications and expect to improve after dialysis.  4.  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  Patient currently not on any antihypertensives.  Will monitor blood pressure and consider medications if necessary.  5.  Anemia in end-stage renal disease:  Today hemoglobin is 8.5 and currently within his baseline range of 7.6-9.  We will continue to monitor.  Patient will get erythropoietin at dialysis per his regular schedule.  DVT prophylaxis: Subcu heparin Code Status: Full code Family Communication: Offered to call family patient declined Disposition Plan: Likely home once dialysis complete and hypoxia resolved Consults called: Nephrology who is aware of the patient. Admission status: It is my clinical opinion that referral for OBSERVATION is reasonable and necessary in this patient based on the above information provided. The aforementioned taken together are felt to place the patient at high risk for further clinical deterioration. However it is anticipated that the patient may be medically stable for discharge from the hospital within 24 to 48 hours.    Lady Deutscher MD FACP [facility] Pager [phone]  How to contact the [facility] Attending or Consulting provider [place] or covering provider during after hours [place], for this patient?  1. Check the care team in [facility] and look for a) attending/consulting [facility] provider listed and b) the [facility] team listed 2. Log into [email or web address] and use [place]'s universal password to access. If you do not have the password, please contact the hospital operator. 3. Locate the [facility] provider you are looking for under  [facility] and page to a number that you can be directly reached. 4. If you still have difficulty reaching the provider, please page the [facility] (Director on Call) for the Hospitalists listed on amion for assistance.  If 7PM-7AM, please contact night-coverage [email or web address] Password [facility]  02/03/2020, 3:06 PM

## 2020-02-03 NOTE — ED Notes (Signed)
Bladder Scan- 154

## 2020-02-04 DIAGNOSIS — R0603 Acute respiratory distress: Secondary | ICD-10-CM | POA: Diagnosis not present

## 2020-02-04 DIAGNOSIS — N186 End stage renal disease: Secondary | ICD-10-CM | POA: Diagnosis not present

## 2020-02-04 DIAGNOSIS — E8770 Fluid overload, unspecified: Secondary | ICD-10-CM | POA: Diagnosis not present

## 2020-02-04 DIAGNOSIS — Z992 Dependence on renal dialysis: Secondary | ICD-10-CM | POA: Diagnosis not present

## 2020-02-04 LAB — CBC
HCT: 27.8 % — ABNORMAL LOW (ref 39.0–52.0)
Hemoglobin: 8.4 g/dL — ABNORMAL LOW (ref 13.0–17.0)
MCH: 31.9 pg (ref 26.0–34.0)
MCHC: 30.2 g/dL (ref 30.0–36.0)
MCV: 105.7 fL — ABNORMAL HIGH (ref 80.0–100.0)
Platelets: DECREASED 10*3/uL (ref 150–400)
RBC: 2.63 MIL/uL — ABNORMAL LOW (ref 4.22–5.81)
RDW: 15.2 % (ref 11.5–15.5)
WBC: 7.7 10*3/uL (ref 4.0–10.5)
nRBC: 0 % (ref 0.0–0.2)

## 2020-02-04 LAB — BASIC METABOLIC PANEL
Anion gap: 12 (ref 5–15)
BUN: 20 mg/dL (ref 6–20)
CO2: 30 mmol/L (ref 22–32)
Calcium: 9.8 mg/dL (ref 8.9–10.3)
Chloride: 98 mmol/L (ref 98–111)
Creatinine, Ser: 6.66 mg/dL — ABNORMAL HIGH (ref 0.61–1.24)
GFR calc Af Amer: 10 mL/min — ABNORMAL LOW (ref >60)
GFR calc non Af Amer: 9 mL/min — ABNORMAL LOW (ref >60)
Glucose, Bld: 109 mg/dL — ABNORMAL HIGH (ref 70–99)
Potassium: 3.9 mmol/L (ref 3.5–5.1)
Sodium: 140 mmol/L (ref 135–145)

## 2020-02-04 LAB — BRAIN NATRIURETIC PEPTIDE: B Natriuretic Peptide: 3406.5 pg/mL — ABNORMAL HIGH (ref 0.0–100.0)

## 2020-02-04 NOTE — Progress Notes (Signed)
Pt just returned to the unit from dialysis. Alert and oriented X4. Pt verbalized that he is going home today. Currently on 2 L02 for SOB. Will continue to monitor pt closely.

## 2020-02-04 NOTE — Progress Notes (Signed)
   [facility] Progress Note   Assessment/ Plan:    OP Dialysis Orders: MWF [place] [facility] 4h 450/800 68kg 2/2 bath LUA AVF Hep none - mircera 225 last 4/2 - venofer 50/wk - sensipar 90 tiw - calcitriol 1 ug  1. Hypoxia/ vol overload- improved s/p HD.  Encouraged compliance with HD.  If sats OK off O2 can d/c from renal perspective 2.ESRD MWF [place].  HD yesterday, resume reg schedule this week 3. Anemia: no ESA  4. CKD-MBD: calcitriol with HD 5. Nutrition: renal diet/ vits 6. Hypertension: improved s/p UF  Subjective:    Seen in room.  HD last night wth 3.5L off.  Took off O2 this AM--> wants to go home. Tells me that his brother died in a car accident with him in the car earlier this year and since then it's been hard.     Objective:   BP (!) 159/100 (BP Location: Right Arm)   Pulse (!) 108   Temp 98.3 F (36.8 C) (Oral)   Resp 20   Ht 5\' 4"  (1.626 m)   Wt 76.2 kg   SpO2 94%   BMI 28.82 kg/m   Physical Exam: Gen: NAD, sitting up and eating breakfast CVS: RRR loud S2 Resp: normal WOB, occasional exp wheeze Abd: soft, nontender Ext: trace LE edema ACCESS: + AVF  Labs: BMET Recent Labs  Lab 01/31/20 2123 02/01/20 0416 02/03/20 0648 02/03/20 1720  NA 144 140 145 143  K 4.7 4.8 4.5 4.8  CL 101 101 102 101  CO2 25 23 27 26   GLUCOSE 79 142* 84 108*  BUN 66* 70* 50* 56*  CREATININE 14.20* 14.57* 12.06* 12.60*  CALCIUM 9.1 8.9 9.7 9.6  PHOS  --   --   --  5.8*   CBC Recent Labs  Lab 01/31/20 2123 02/01/20 0416 02/03/20 0648  WBC 8.2 7.5 7.5  NEUTROABS 6.0  --  6.2  HGB 9.0* 7.6* 8.6*  HCT 30.2* 25.5* 29.5*  MCV 109.0* 108.5* 110.1*  PLT 144* 129* 140*      Medications:    . [START ON 02/06/2020] calcitRIOL  0.5 mcg Oral Q M,W,F-HD  . calcium acetate  2,001 mg Oral TID WC  . Chlorhexidine Gluconate Cloth  6 each Topical Q0600  . heparin  5,000 Units Subcutaneous Q8H  . nicotine  14 mg Transdermal Daily  .  sodium chloride flush  3 mL Intravenous Q12H     Madelon Lips, MD 02/04/2020, 9:40 AM

## 2020-02-04 NOTE — Progress Notes (Signed)
Patient states 'he will only be napping until 9am when he is leaving' and he does not need his heart monitor on. Tele box removed from the room and CCMD made aware that patient is refusing.

## 2020-02-04 NOTE — Progress Notes (Signed)
Pt has been picked up by the hospital transporter to the dialysis center. Pt is alert and oriented X4 at the time of transfer to another unit. Able to communicate needs. Itching medication was administered right before the transfer. No respiratory distress was noted.

## 2020-02-04 NOTE — Discharge Summary (Signed)
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 male with medical history significant of end-stage renal disease on hemodialysis who still makes urine, anemia, alcohol abuse, and a history of nonadherence to medical therapy who left AMA from [facility] on 512 after having been admitted on 511.  He also left AMA from [facility] on 5 6.   -Presented to the ED with fluid overload following noncompliance with hemodialysis  Hospital Course:  Jordan [name] is a 54 year old male with ESRD on hemodialysis Monday Wednesday Friday, alcohol use, chronic anemia, diastolic CHF, noncompliance, 2 recent hospitalizations with fluid overload from noncompliance with dialysis presented to the ED with dyspnea fluid overload yesterday afternoon. -He was dialyzed overnight and improved significantly, weaned off oxygen -Ambulating in his room and in the halls now, anxious to discharge right now. -Discussed compliance with dialysis with the patient, including risk of death. 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 -Denies having problems with transportation, counseled on the importance of compliance and that it needs to be a priority for him to stay alive -He tells me he will go to dialysis on Monday  Consultations:  Nephrology  Discharge Exam: Vitals:   02/04/20 0550 02/04/20 0641  BP: (!) 176/98 (!) 159/100  Pulse: (!) 106 (!) 108  Resp: (!) 26 20  Temp: 98.5 F (36.9 C) 98.3 F (36.8 C)  SpO2: 100% 94%    General: AAOx3 Cardiovascular: S1S2/RRR Respiratory: CTAB  Discharge Instructions   Discharge Instructions    Discharge instructions   Complete by: As directed    Renal Diet   Increase activity slowly   Complete by: As directed      Allergies as of 02/04/2020   No Known Allergies     Medication List    TAKE these medications   acetaminophen 500 MG tablet Commonly known as: TYLENOL Take 1,000 mg by mouth every 6 (six) hours as needed for mild pain or fever.   albuterol 108 (90 Base) MCG/ACT inhaler Commonly known as: VENTOLIN HFA Inhale 2 puffs into the lungs every 4 (four) hours as needed for wheezing or shortness of breath.   calcitRIOL 0.5 MCG capsule Commonly known as: ROCALTROL Take 1 capsule (0.5 mcg total) by mouth every Monday, Wednesday, and Friday with hemodialysis.   calcium acetate 667 MG capsule Commonly known as: PHOSLO Take 2 capsules (1,334 mg total) by mouth 3 (three) times daily with meals. What changed: how much to take   hydrOXYzine 50 MG tablet Commonly known as: ATARAX/VISTARIL Take 50 mg by mouth  3 (three) times daily as needed for anxiety or itching.   Sarna lotion Generic drug: camphor-menthol Apply 1 application topically as needed for itching.   triamcinolone cream 0.1 % Commonly known as: KENALOG Apply 1 application topically 2 (two) times daily. What changed:   when to take this  reasons to take this      No Known Allergies Follow-up Information    Jearld Fenton, NP. Schedule an appointment as soon as possible for a visit in 1 week(s).   Specialties: Internal Medicine, Emergency Medicine Contact information: [address] [place] [address] [phone]        Dialysis Follow up on 02/06/2020.            The results of significant diagnostics from this hospitalization (including imaging, microbiology, ancillary and laboratory) are listed below for reference.    Significant Diagnostic Studies: DG Chest 2 View  Result Date: 01/24/2020 CLINICAL DATA:  54 year old male with shortness of breath. EXAM: CHEST - 2 VIEW COMPARISON:  Chest radiograph dated 12/09/2019. FINDINGS: There is cardiomegaly with vascular congestion and probable mild interstitial edema. Pneumonia is not excluded. Clinical correlation is recommended. No focal consolidation, pleural effusion, pneumothorax. Atherosclerotic calcification of the aorta. No acute osseous pathology. IMPRESSION: Cardiomegaly with vascular congestion and probable mild interstitial edema. Pneumonia is not excluded. Electronically Signed   By: Anner Crete M.D.   On: 01/24/2020 01:59   DG Chest Portable 1 View  Result Date: 02/03/2020 CLINICAL DATA:  Shortness of breath. EXAM: PORTABLE CHEST 1 VIEW COMPARISON:  Chest x-ray dated Jan 31, 2020. FINDINGS: Unchanged mild cardiomegaly with diffuse interstitial thickening. No focal consolidation, pleural effusion, or pneumothorax. No acute osseous abnormality. IMPRESSION: Unchanged interstitial pulmonary edema. Electronically Signed   By: Titus Dubin M.D.   On: 02/03/2020 10:50   DG Chest Portable 1 View  Result Date: 01/31/2020 CLINICAL DATA:  Shortness of breath EXAM: PORTABLE CHEST 1 VIEW COMPARISON:  Jan 24, 2020 FINDINGS: There is unchanged mild cardiomegaly. There is prominence of the central pulmonary vasculature with mildly increased left airspace opacities seen throughout both, increased from the prior exam. No pleural effusion.  No acute osseous abnormality. IMPRESSION: Cardiomegaly and increased diffuse bilateral airspace opacities, likely pulmonary edema, however infectious etiology. Electronically Signed   By: Prudencio Pair M.D.   On: 01/31/2020 21:43   ECHOCARDIOGRAM COMPLETE  Result Date: 01/24/2020    ECHOCARDIOGRAM REPORT   Patient Name:   VUE PAVON Date of Exam: 01/24/2020 Medical Rec #:  [id number]        Height:       64.0 in Accession #:    [id number]       Weight:       157.0 lb Date of Birth:  July 25, 1966        BSA:          1.765 m Patient Age:    22 years         BP:           187/113 mmHg Patient Gender: M                HR:           99 bpm. Exam Location:  Inpatient Procedure: 2D Echo, Color Doppler and Cardiac Doppler Indications:    R55 Syncope  History:        Patient has no prior history of Echocardiogram examinations.  Risk Factors:Hypertension and ETOH Abuse. ESRD.  Sonographer:    Raquel Sarna Senior RDCS Referring Phys: [phone] [place]  Sonographer Comments: Patient short of breath and very mobile throughout exam. IMPRESSIONS  1. Left ventricular ejection fraction, by estimation, is 45 to 50%. The left ventricle has mildly decreased function. The left ventricle demonstrates global hypokinesis. There is moderate left ventricular hypertrophy. Left ventricular diastolic parameters are consistent with Grade III diastolic dysfunction (restrictive). Elevated left ventricular end-diastolic pressure.  2. Right ventricular systolic function is normal. The right ventricular size is normal. There is severely elevated pulmonary artery systolic pressure.  3. Left atrial size was severely dilated.  4. Right atrial size was severely dilated.  5. The mitral valve is grossly normal. Mild mitral valve regurgitation. No evidence of mitral stenosis.  6. Tricuspid valve regurgitation is moderate.  7. The aortic valve is tricuspid. Aortic valve regurgitation is not visualized. Mild aortic valve sclerosis is  present, with no evidence of aortic valve stenosis.  8. The inferior vena cava is dilated in size with <50% respiratory variability, suggesting right atrial pressure of 15 mmHg. Comparison(s): No prior Echocardiogram. FINDINGS  Left Ventricle: Left ventricular ejection fraction, by estimation, is 45 to 50%. The left ventricle has mildly decreased function. The left ventricle demonstrates global hypokinesis. The left ventricular internal cavity size was normal in size. There is  moderate left ventricular hypertrophy. Left ventricular diastolic parameters are consistent with Grade III diastolic dysfunction (restrictive). Elevated left ventricular end-diastolic pressure. Right Ventricle: The right ventricular size is normal. No increase in right ventricular wall thickness. Right ventricular systolic function is normal. There is severely elevated pulmonary artery systolic pressure. The tricuspid regurgitant velocity is 3.45 m/s, and with an assumed right atrial pressure of 15 mmHg, the estimated right ventricular systolic pressure is 02.5 mmHg. Left Atrium: Left atrial size was severely dilated. Right Atrium: Right atrial size was severely dilated. Pericardium: Trivial pericardial effusion is present. Mitral Valve: The mitral valve is grossly normal. Moderate mitral annular calcification. Mild mitral valve regurgitation. No evidence of mitral valve stenosis. Tricuspid Valve: The tricuspid valve is normal in structure. Tricuspid valve regurgitation is moderate . No evidence of tricuspid stenosis. Aortic Valve: The aortic valve is tricuspid. . There is mild thickening and mild calcification of the aortic valve. Aortic valve regurgitation is not visualized. Mild aortic valve sclerosis is present, with no evidence of aortic valve stenosis. There is mild thickening of the aortic valve. There is mild calcification of the aortic valve. Pulmonic Valve: The pulmonic valve was grossly normal. Pulmonic valve regurgitation is  trivial. No evidence of pulmonic stenosis. Aorta: The aortic root, ascending aorta and aortic arch are all structurally normal, with no evidence of dilitation or obstruction. Pulmonary Artery: The pulmonary artery is of normal size. Venous: The inferior vena cava is dilated in size with less than 50% respiratory variability, suggesting right atrial pressure of 15 mmHg. IAS/Shunts: No atrial level shunt detected by color flow Doppler. Additional Comments: There is a small pleural effusion. Moderate ascites is present.  LEFT VENTRICLE PLAX 2D LVIDd:         4.63 cm      Diastology LVIDs:         3.18 cm      LV e' lateral:   9.14 cm/s LV PW:         1.28 cm      LV E/e' lateral: 17.5 LV IVS:        1.49 cm  LV e' medial:    6.53 cm/s LVOT diam:     2.10 cm      LV E/e' medial:  24.5 LV SV:         67 LV SV Index:   38 LVOT Area:     3.46 cm  LV Volumes (MOD) LV vol d, MOD A2C: 142.0 ml LV vol d, MOD A4C: 135.0 ml LV vol s, MOD A2C: 78.2 ml LV vol s, MOD A4C: 74.5 ml LV SV MOD A2C:     63.8 ml LV SV MOD A4C:     135.0 ml LV SV MOD BP:      68.1 ml RIGHT VENTRICLE RV S prime:     10.10 cm/s TAPSE (M-mode): 1.8 cm LEFT ATRIUM             Index       RIGHT ATRIUM           Index LA diam:        4.00 cm 2.27 cm/m  RA Area:     24.20 cm LA Vol (A2C):   91.4 ml 51.78 ml/m RA Volume:   81.00 ml  45.89 ml/m LA Vol (A4C):   85.9 ml 48.67 ml/m LA Biplane Vol: 89.0 ml 50.42 ml/m  AORTIC VALVE LVOT Vmax:   107.00 cm/s LVOT Vmean:  68.700 cm/s LVOT VTI:    0.192 m  AORTA Ao Root diam: 3.10 cm Ao Asc diam:  3.60 cm MITRAL VALVE                 TRICUSPID VALVE MV Area (PHT): 4.49 cm      TR Peak grad:   47.6 mmHg MV Decel Time: 169 msec      TR Vmax:        345.00 cm/s MR Peak grad:    121.0 mmHg MR Mean grad:    93.0 mmHg   SHUNTS MR Vmax:         550.00 cm/s Systemic VTI:  0.19 m MR Vmean:        467.0 cm/s  Systemic Diam: 2.10 cm MR PISA:         6.28 cm MR PISA Eff ROA: 46 mm MR PISA Radius:  1.00 cm MV E velocity:  160.00 cm/s MV A velocity: 46.90 cm/s MV E/A ratio:  3.41 Buford Dresser MD Electronically signed by Buford Dresser MD Signature Date/Time: 01/24/2020/3:05:15 PM    Final     Microbiology: Recent Results (from the past 240 hour(s))  SARS Coronavirus 2 by RT PCR (hospital order, performed in [facility] hospital lab) Nasopharyngeal Nasopharyngeal Swab     Status: None   Collection Time: 01/31/20 10:15 PM   Specimen: Nasopharyngeal Swab  Result Value Ref Range Status   SARS Coronavirus 2 NEGATIVE NEGATIVE Final    Comment: (NOTE) SARS-CoV-2 target nucleic acids are NOT DETECTED. The SARS-CoV-2 RNA is generally detectable in upper and lower respiratory specimens during the acute phase of infection. The lowest concentration of SARS-CoV-2 viral copies this assay can detect is 250 copies / mL. A negative result does not preclude SARS-CoV-2 infection and should not be used as the sole basis for treatment or other patient management decisions.  A negative result may occur with improper specimen collection / handling, submission of specimen other than nasopharyngeal swab, presence of viral mutation(s) within the areas targeted by this assay, and inadequate number of viral copies (<250 copies / mL). A negative result must be combined with clinical  observations, patient history, and epidemiological information. Fact Sheet for Patients:   StrictlyIdeas.no Fact Sheet for Healthcare Providers: BankingDealers.co.za This test is not yet approved or cleared  by the [place] FDA and has been authorized for detection and/or diagnosis of SARS-CoV-2 by FDA under an Emergency Use Authorization (EUA).  This EUA will remain in effect (meaning this test can be used) for the duration of the COVID-19 declaration under Section 564(b)(1) of the Act, 21 U.S.C. section 360bbb-3(b)(1), unless the authorization is terminated or revoked sooner. Performed  at [facility], [address] [place], [place] [address]   MRSA PCR Screening     Status: None   Collection Time: 02/01/20  2:26 AM   Specimen: Nasopharyngeal  Result Value Ref Range Status   MRSA by PCR NEGATIVE NEGATIVE Final    Comment:        The GeneXpert MRSA Assay (FDA approved for NASAL specimens only), is one component of a comprehensive MRSA colonization surveillance program. It is not intended to diagnose MRSA infection nor to guide or monitor treatment for MRSA infections. Performed at [facility], [address] [place], [place] [address]   SARS Coronavirus 2 by RT PCR (hospital order, performed in [facility] hospital lab) Nasopharyngeal Nasopharyngeal Swab     Status: None   Collection Time: 02/03/20  3:23 PM   Specimen: Nasopharyngeal Swab  Result Value Ref Range Status   SARS Coronavirus 2 NEGATIVE NEGATIVE Final    Comment: (NOTE) SARS-CoV-2 target nucleic acids are NOT DETECTED. The SARS-CoV-2 RNA is generally detectable in upper and lower respiratory specimens during the acute phase of infection. The lowest concentration of SARS-CoV-2 viral copies this assay can detect is 250 copies / mL. A negative result does not preclude SARS-CoV-2 infection and should not be used as the sole basis for treatment or other patient management decisions.  A negative result may occur with improper specimen collection / handling, submission of specimen other than nasopharyngeal swab, presence of viral mutation(s) within the areas targeted by this assay, and inadequate number of viral copies (<250 copies / mL). A negative result must be combined with clinical observations, patient history, and epidemiological information. Fact Sheet for Patients:   StrictlyIdeas.no Fact Sheet for Healthcare Providers: BankingDealers.co.za This test is not yet approved or cleared  by the [place] FDA and has been  authorized for detection and/or diagnosis of SARS-CoV-2 by FDA under an Emergency Use Authorization (EUA).  This EUA will remain in effect (meaning this test can be used) for the duration of the COVID-19 declaration under Section 564(b)(1) of the Act, 21 U.S.C. section 360bbb-3(b)(1), unless the authorization is terminated or revoked sooner. Performed at [facility], [place] [address], [place], [place] [address]      Labs: Basic Metabolic Panel: Recent Labs  Lab 01/31/20 2123 02/01/20 0416 02/03/20 0648 02/03/20 1720 02/04/20 0931  NA 144 140 145 143 140  K 4.7 4.8 4.5 4.8 3.9  CL 101 101 102 101 98  CO2 25 23 27 26 30   GLUCOSE 79 142* 84 108* 109*  BUN 66* 70* 50* 56* 20  CREATININE 14.20* 14.57* 12.06* 12.60* 6.66*  CALCIUM 9.1 8.9 9.7 9.6 9.8  MG  --   --   --  2.1  --   PHOS  --   --   --  5.8*  --    Liver Function Tests: Recent Labs  Lab 01/31/20 2123 02/01/20 0416 02/03/20 1720  AST 26 15  --  ALT 14 12  --   ALKPHOS 106 104  --   BILITOT 1.7* 1.5*  --   PROT 7.9 7.0  --   ALBUMIN 3.9 3.5 3.4*   No results for input(s): LIPASE, AMYLASE in the last 168 hours. No results for input(s): AMMONIA in the last 168 hours. CBC: Recent Labs  Lab 01/31/20 2123 02/01/20 0416 02/03/20 0648 02/04/20 0931  WBC 8.2 7.5 7.5 7.7  NEUTROABS 6.0  --  6.2  --   HGB 9.0* 7.6* 8.6* 8.4*  HCT 30.2* 25.5* 29.5* 27.8*  MCV 109.0* 108.5* 110.1* 105.7*  PLT 144* 129* 140* PLATELET CLUMPS NOTED ON SMEAR, COUNT APPEARS DECREASED   Cardiac Enzymes: No results for input(s): CKTOTAL, CKMB, CKMBINDEX, TROPONINI in the last 168 hours. BNP: BNP (last 3 results) Recent Labs    02/03/20 1720 02/04/20 0931  BNP 3,674.7* 3,406.5*    ProBNP (last 3 results) No results for input(s): PROBNP in the last 8760 hours.  CBG: Recent Labs  Lab 02/01/20 0748  GLUCAP 88       Signed:  Domenic Polite MD.  [facility] 02/04/2020, 12:13 PM

## 2020-02-04 NOTE — Plan of Care (Signed)

## 2020-02-05 ENCOUNTER — Telehealth (HOSPITAL_COMMUNITY): Payer: Self-pay | Admitting: Nephrology

## 2020-02-05 NOTE — Telephone Encounter (Signed)
Transition of care contact from inpatient facility  Date of Discharge: 02/04/2020 Date of Contact: 02/05/2020 --- attempted Method of contact: Phone  Attempted to contact patient to discuss transition of care from inpatient admission. Patient did not answer the phone and no ability to leave message. Will try to call tomorrow.  Veneta Penton, PA-C [employer] Pager [phone]

## 2020-02-06 DIAGNOSIS — N186 End stage renal disease: Secondary | ICD-10-CM | POA: Diagnosis not present

## 2020-02-06 DIAGNOSIS — D509 Iron deficiency anemia, unspecified: Secondary | ICD-10-CM | POA: Diagnosis not present

## 2020-02-06 DIAGNOSIS — D631 Anemia in chronic kidney disease: Secondary | ICD-10-CM | POA: Diagnosis not present

## 2020-02-06 DIAGNOSIS — Z992 Dependence on renal dialysis: Secondary | ICD-10-CM | POA: Diagnosis not present

## 2020-02-06 DIAGNOSIS — N2581 Secondary hyperparathyroidism of renal origin: Secondary | ICD-10-CM | POA: Diagnosis not present

## 2020-02-08 ENCOUNTER — Telehealth (HOSPITAL_COMMUNITY): Payer: Self-pay | Admitting: Nephrology

## 2020-02-08 DIAGNOSIS — Z992 Dependence on renal dialysis: Secondary | ICD-10-CM | POA: Diagnosis not present

## 2020-02-08 DIAGNOSIS — N2581 Secondary hyperparathyroidism of renal origin: Secondary | ICD-10-CM | POA: Diagnosis not present

## 2020-02-08 DIAGNOSIS — D509 Iron deficiency anemia, unspecified: Secondary | ICD-10-CM | POA: Diagnosis not present

## 2020-02-08 DIAGNOSIS — N186 End stage renal disease: Secondary | ICD-10-CM | POA: Diagnosis not present

## 2020-02-08 DIAGNOSIS — D631 Anemia in chronic kidney disease: Secondary | ICD-10-CM | POA: Diagnosis not present

## 2020-02-08 NOTE — Telephone Encounter (Signed)
Transition of care contact from inpatient facility  Date of Discharge: 02/04/2020 Date of Contact: 02/08/2020 ---attempted Method of contact: Phone  Attempted to contact patient to discuss transition of care from inpatient admission. Patient did not answer the phone - no ability to leave voicemail message.  Will follow-up with him at dialysis this week.  Veneta Penton, PA-C [employer] Pager [phone]

## 2020-02-10 DIAGNOSIS — Z992 Dependence on renal dialysis: Secondary | ICD-10-CM | POA: Diagnosis not present

## 2020-02-10 DIAGNOSIS — N2581 Secondary hyperparathyroidism of renal origin: Secondary | ICD-10-CM | POA: Diagnosis not present

## 2020-02-10 DIAGNOSIS — D631 Anemia in chronic kidney disease: Secondary | ICD-10-CM | POA: Diagnosis not present

## 2020-02-10 DIAGNOSIS — D509 Iron deficiency anemia, unspecified: Secondary | ICD-10-CM | POA: Diagnosis not present

## 2020-02-10 DIAGNOSIS — N186 End stage renal disease: Secondary | ICD-10-CM | POA: Diagnosis not present

## 2020-02-17 DIAGNOSIS — D631 Anemia in chronic kidney disease: Secondary | ICD-10-CM | POA: Diagnosis not present

## 2020-02-17 DIAGNOSIS — N2581 Secondary hyperparathyroidism of renal origin: Secondary | ICD-10-CM | POA: Diagnosis not present

## 2020-02-17 DIAGNOSIS — Z992 Dependence on renal dialysis: Secondary | ICD-10-CM | POA: Diagnosis not present

## 2020-02-17 DIAGNOSIS — D509 Iron deficiency anemia, unspecified: Secondary | ICD-10-CM | POA: Diagnosis not present

## 2020-02-17 DIAGNOSIS — N186 End stage renal disease: Secondary | ICD-10-CM | POA: Diagnosis not present

## 2020-02-20 ENCOUNTER — Emergency Department: Payer: Medicare Other

## 2020-02-20 ENCOUNTER — Other Ambulatory Visit: Payer: Self-pay

## 2020-02-20 ENCOUNTER — Observation Stay
Admission: EM | Admit: 2020-02-20 | Discharge: 2020-02-20 | Discharge status: Against Medical Advice | Payer: Medicare Other | Attending: Internal Medicine | Admitting: Internal Medicine

## 2020-02-20 DIAGNOSIS — J810 Acute pulmonary edema: Secondary | ICD-10-CM

## 2020-02-20 DIAGNOSIS — E161 Other hypoglycemia: Secondary | ICD-10-CM | POA: Diagnosis not present

## 2020-02-20 DIAGNOSIS — Z20822 Contact with and (suspected) exposure to covid-19: Secondary | ICD-10-CM | POA: Diagnosis not present

## 2020-02-20 DIAGNOSIS — I700 Atherosclerosis of aorta: Secondary | ICD-10-CM | POA: Insufficient documentation

## 2020-02-20 DIAGNOSIS — I5043 Acute on chronic combined systolic (congestive) and diastolic (congestive) heart failure: Secondary | ICD-10-CM | POA: Diagnosis present

## 2020-02-20 DIAGNOSIS — N186 End stage renal disease: Secondary | ICD-10-CM

## 2020-02-20 DIAGNOSIS — I132 Hypertensive heart and chronic kidney disease with heart failure and with stage 5 chronic kidney disease, or end stage renal disease: Principal | ICD-10-CM | POA: Insufficient documentation

## 2020-02-20 DIAGNOSIS — Z833 Family history of diabetes mellitus: Secondary | ICD-10-CM | POA: Diagnosis not present

## 2020-02-20 DIAGNOSIS — Z79899 Other long term (current) drug therapy: Secondary | ICD-10-CM | POA: Diagnosis not present

## 2020-02-20 DIAGNOSIS — E876 Hypokalemia: Secondary | ICD-10-CM | POA: Diagnosis not present

## 2020-02-20 DIAGNOSIS — J9601 Acute respiratory failure with hypoxia: Secondary | ICD-10-CM | POA: Insufficient documentation

## 2020-02-20 DIAGNOSIS — J439 Emphysema, unspecified: Secondary | ICD-10-CM | POA: Insufficient documentation

## 2020-02-20 DIAGNOSIS — Z8619 Personal history of other infectious and parasitic diseases: Secondary | ICD-10-CM | POA: Diagnosis not present

## 2020-02-20 DIAGNOSIS — F17210 Nicotine dependence, cigarettes, uncomplicated: Secondary | ICD-10-CM | POA: Diagnosis not present

## 2020-02-20 DIAGNOSIS — Z992 Dependence on renal dialysis: Secondary | ICD-10-CM | POA: Diagnosis not present

## 2020-02-20 DIAGNOSIS — J90 Pleural effusion, not elsewhere classified: Secondary | ICD-10-CM | POA: Insufficient documentation

## 2020-02-20 DIAGNOSIS — J9621 Acute and chronic respiratory failure with hypoxia: Secondary | ICD-10-CM | POA: Diagnosis present

## 2020-02-20 DIAGNOSIS — E162 Hypoglycemia, unspecified: Secondary | ICD-10-CM | POA: Diagnosis not present

## 2020-02-20 DIAGNOSIS — R0902 Hypoxemia: Secondary | ICD-10-CM | POA: Diagnosis not present

## 2020-02-20 DIAGNOSIS — D631 Anemia in chronic kidney disease: Secondary | ICD-10-CM | POA: Diagnosis not present

## 2020-02-20 DIAGNOSIS — Z743 Need for continuous supervision: Secondary | ICD-10-CM | POA: Diagnosis not present

## 2020-02-20 DIAGNOSIS — R0602 Shortness of breath: Secondary | ICD-10-CM | POA: Diagnosis not present

## 2020-02-20 DIAGNOSIS — R188 Other ascites: Secondary | ICD-10-CM | POA: Diagnosis not present

## 2020-02-20 DIAGNOSIS — Z841 Family history of disorders of kidney and ureter: Secondary | ICD-10-CM | POA: Diagnosis not present

## 2020-02-20 DIAGNOSIS — D649 Anemia, unspecified: Secondary | ICD-10-CM | POA: Diagnosis not present

## 2020-02-20 DIAGNOSIS — I10 Essential (primary) hypertension: Secondary | ICD-10-CM | POA: Diagnosis present

## 2020-02-20 DIAGNOSIS — I5042 Chronic combined systolic (congestive) and diastolic (congestive) heart failure: Secondary | ICD-10-CM | POA: Diagnosis present

## 2020-02-20 DIAGNOSIS — I129 Hypertensive chronic kidney disease with stage 1 through stage 4 chronic kidney disease, or unspecified chronic kidney disease: Secondary | ICD-10-CM | POA: Diagnosis not present

## 2020-02-20 LAB — CBC WITH DIFFERENTIAL/PLATELET
Abs Immature Granulocytes: 0.03 10*3/uL (ref 0.00–0.07)
Basophils Absolute: 0 10*3/uL (ref 0.0–0.1)
Basophils Relative: 1 %
Eosinophils Absolute: 0.3 10*3/uL (ref 0.0–0.5)
Eosinophils Relative: 4 %
HCT: 18.9 % — ABNORMAL LOW (ref 39.0–52.0)
Hemoglobin: 5.8 g/dL — ABNORMAL LOW (ref 13.0–17.0)
Immature Granulocytes: 1 %
Lymphocytes Relative: 8 %
Lymphs Abs: 0.5 10*3/uL — ABNORMAL LOW (ref 0.7–4.0)
MCH: 32.4 pg (ref 26.0–34.0)
MCHC: 30.7 g/dL (ref 30.0–36.0)
MCV: 105.6 fL — ABNORMAL HIGH (ref 80.0–100.0)
Monocytes Absolute: 0.4 10*3/uL (ref 0.1–1.0)
Monocytes Relative: 7 %
Neutro Abs: 4.8 10*3/uL (ref 1.7–7.7)
Neutrophils Relative %: 79 %
Platelets: 126 10*3/uL — ABNORMAL LOW (ref 150–400)
RBC: 1.79 MIL/uL — ABNORMAL LOW (ref 4.22–5.81)
RDW: 15.8 % — ABNORMAL HIGH (ref 11.5–15.5)
WBC: 6 10*3/uL (ref 4.0–10.5)
nRBC: 0 % (ref 0.0–0.2)

## 2020-02-20 LAB — COMPREHENSIVE METABOLIC PANEL
ALT: 8 U/L (ref 0–44)
AST: 13 U/L — ABNORMAL LOW (ref 15–41)
Albumin: 3.8 g/dL (ref 3.5–5.0)
Alkaline Phosphatase: 100 U/L (ref 38–126)
Anion gap: 19 — ABNORMAL HIGH (ref 5–15)
BUN: 66 mg/dL — ABNORMAL HIGH (ref 6–20)
CO2: 22 mmol/L (ref 22–32)
Calcium: 9.3 mg/dL (ref 8.9–10.3)
Chloride: 100 mmol/L (ref 98–111)
Creatinine, Ser: 14.77 mg/dL — ABNORMAL HIGH (ref 0.61–1.24)
GFR calc Af Amer: 4 mL/min — ABNORMAL LOW (ref >60)
GFR calc non Af Amer: 3 mL/min — ABNORMAL LOW (ref >60)
Glucose, Bld: 81 mg/dL (ref 70–99)
Potassium: 4.6 mmol/L (ref 3.5–5.1)
Sodium: 141 mmol/L (ref 135–145)
Total Bilirubin: 2 mg/dL — ABNORMAL HIGH (ref 0.3–1.2)
Total Protein: 7.5 g/dL (ref 6.5–8.1)

## 2020-02-20 LAB — TROPONIN I (HIGH SENSITIVITY)
Troponin I (High Sensitivity): 73 ng/L — ABNORMAL HIGH (ref <18)
Troponin I (High Sensitivity): 76 ng/L — ABNORMAL HIGH (ref <18)

## 2020-02-20 LAB — ETHANOL: Alcohol, Ethyl (B): <10 mg/dL (ref <10)

## 2020-02-20 LAB — SARS CORONAVIRUS 2 BY RT PCR (HOSPITAL ORDER, PERFORMED IN ~~LOC~~ HOSPITAL LAB): SARS Coronavirus 2: NEGATIVE

## 2020-02-20 LAB — PHOSPHORUS: Phosphorus: 6.4 mg/dL — ABNORMAL HIGH (ref 2.5–4.6)

## 2020-02-20 LAB — PREPARE RBC (CROSSMATCH)

## 2020-02-20 IMAGING — DX DG CHEST 1V PORT
1 series · 1 of 1 positions shown · non-contrast
Comparison: [DATE]

CLINICAL DATA: Shortness of breath

EXAM:
PORTABLE CHEST 1 VIEW

[chest ap]
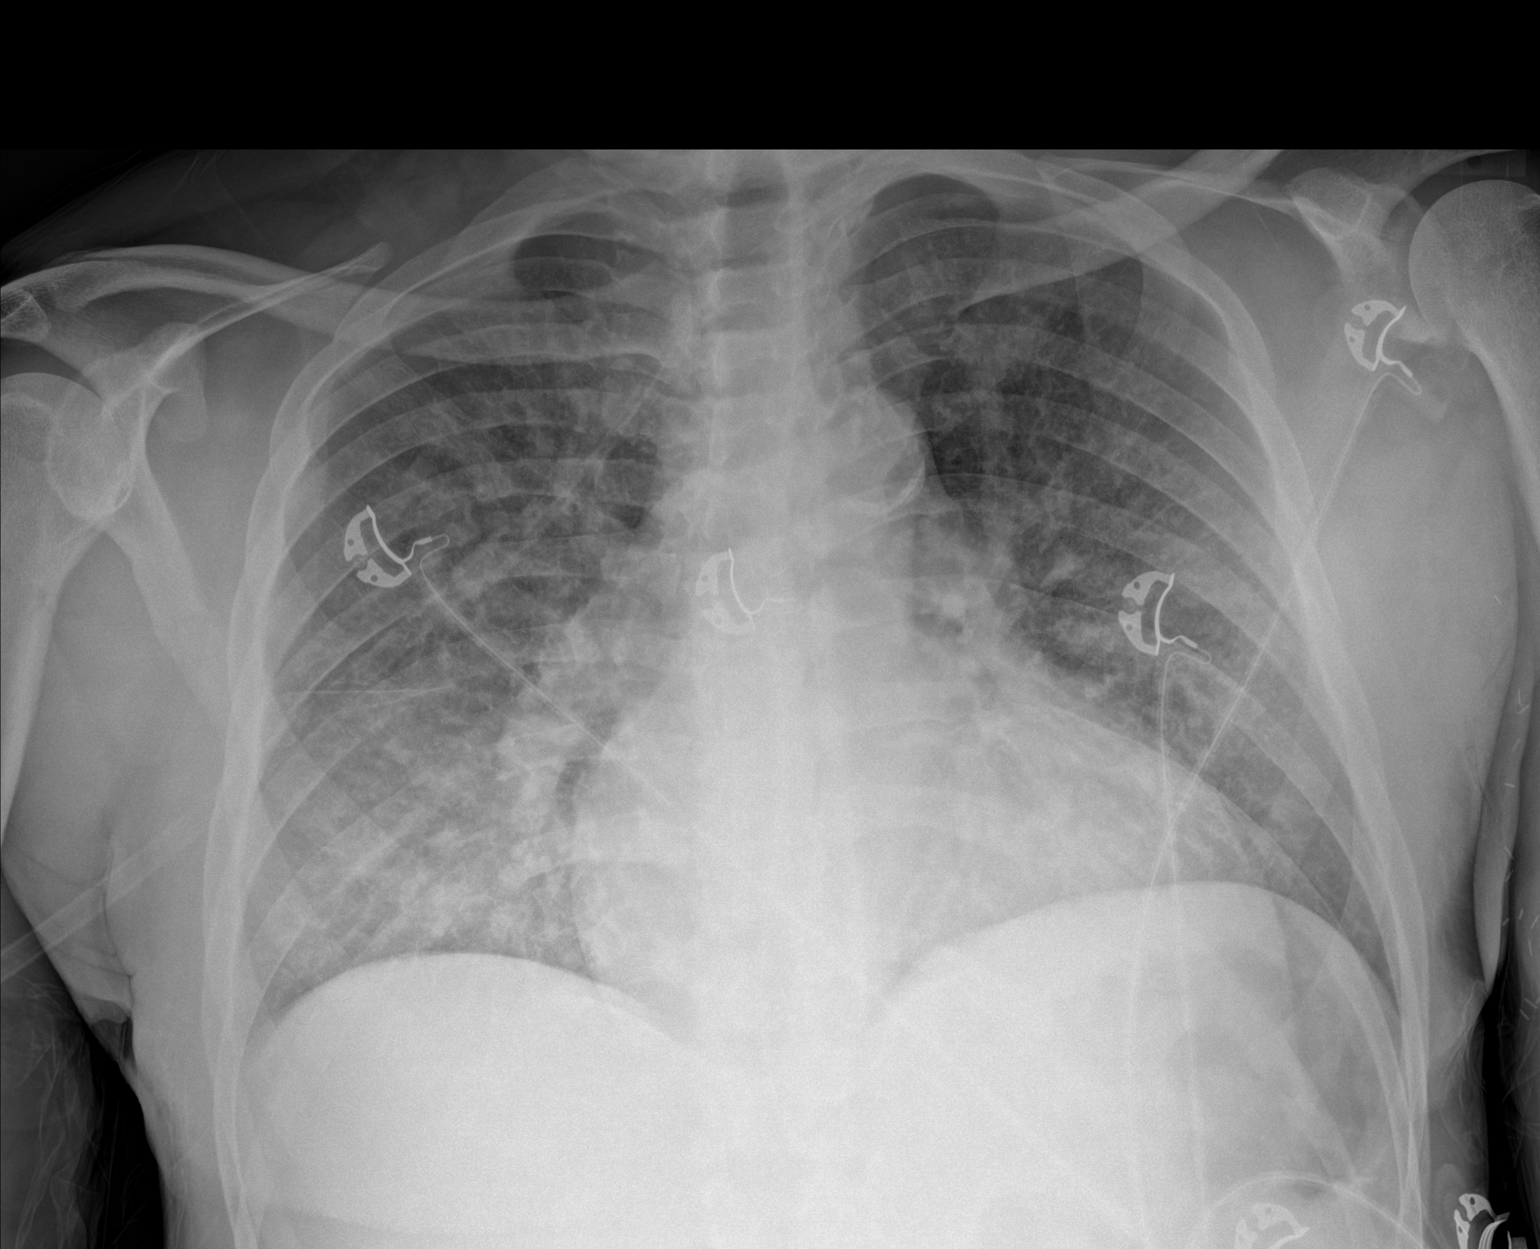

[1 of 1 positions shown; findings below may reference images not displayed]

FINDINGS: Again noted is cardiomegaly. Aortic knob calcifications. Slight
interval worsening in the diffuse fluffy interstitial opacity seen
throughout both lungs. No pleural effusion. No acute osseous
abnormality.
IMPRESSION: Slight interval worsening in the diffuse pulmonary edema.

## 2020-02-20 MED ORDER — LIDOCAINE HCL (PF) 1 % IJ SOLN
5.0000 mL | INTRAMUSCULAR | Status: DC | PRN
  Filled 2020-02-20: qty 5

## 2020-02-20 MED ORDER — SODIUM CHLORIDE 0.9 % IV SOLN: 100.0000 mL | INTRAVENOUS | Status: DC | PRN

## 2020-02-20 MED ORDER — SODIUM CHLORIDE 0.9% FLUSH
3.0000 mL | Freq: Two times a day (BID) | INTRAVENOUS | Status: DC
  Administered 2020-02-20: 3 mL via INTRAVENOUS

## 2020-02-20 MED ORDER — SODIUM CHLORIDE 0.9 % IV SOLN: 250.0000 mL | INTRAVENOUS | Status: DC | PRN

## 2020-02-20 MED ORDER — SODIUM CHLORIDE 0.9 % IV SOLN
10.0000 mL/h | Freq: Once | INTRAVENOUS | Status: AC
  Administered 2020-02-20: 10 mL/h via INTRAVENOUS

## 2020-02-20 MED ORDER — SODIUM CHLORIDE 0.9% FLUSH: 3.0000 mL | INTRAVENOUS | Status: DC | PRN

## 2020-02-20 MED ORDER — ALTEPLASE 2 MG IJ SOLR: 2.0000 mg | Freq: Once | INTRAMUSCULAR | Status: DC | PRN

## 2020-02-20 MED ORDER — LIDOCAINE-PRILOCAINE 2.5-2.5 % EX CREA
1.0000 "application " | TOPICAL_CREAM | CUTANEOUS | Status: DC | PRN
  Filled 2020-02-20: qty 5

## 2020-02-20 MED ORDER — PENTAFLUOROPROP-TETRAFLUOROETH EX AERO
1.0000 "application " | INHALATION_SPRAY | CUTANEOUS | Status: DC | PRN
  Filled 2020-02-20 (×2): qty 30

## 2020-02-20 MED ORDER — FUROSEMIDE 10 MG/ML IJ SOLN
40.0000 mg | Freq: Once | INTRAMUSCULAR | Status: AC
  Administered 2020-02-20: 40 mg via INTRAVENOUS
  Filled 2020-02-20: qty 4

## 2020-02-20 MED ORDER — ALTEPLASE 2 MG IJ SOLR
2.0000 mg | Freq: Once | INTRAMUSCULAR | Status: DC | PRN
  Filled 2020-02-20: qty 2

## 2020-02-20 MED ORDER — PENTAFLUOROPROP-TETRAFLUOROETH EX AERO
1.0000 "application " | INHALATION_SPRAY | CUTANEOUS | Status: DC | PRN
  Filled 2020-02-20: qty 30

## 2020-02-20 MED ORDER — HEPARIN SODIUM (PORCINE) 1000 UNIT/ML DIALYSIS
1000.0000 [IU] | INTRAMUSCULAR | Status: DC | PRN
  Filled 2020-02-20 (×2): qty 1

## 2020-02-20 MED ORDER — ONDANSETRON HCL 4 MG/2ML IJ SOLN: 4.0000 mg | Freq: Four times a day (QID) | INTRAMUSCULAR | Status: DC | PRN

## 2020-02-20 MED ORDER — ACETAMINOPHEN 325 MG PO TABS: 650.0000 mg | ORAL_TABLET | ORAL | Status: DC | PRN

## 2020-02-20 MED ORDER — CHLORHEXIDINE GLUCONATE CLOTH 2 % EX PADS
6.0000 | MEDICATED_PAD | Freq: Every day | CUTANEOUS | Status: DC
  Filled 2020-02-20: qty 6

## 2020-02-20 MED ORDER — HEPARIN SODIUM (PORCINE) 5000 UNIT/ML IJ SOLN: 5000.0000 [IU] | Freq: Three times a day (TID) | INTRAMUSCULAR | Status: DC

## 2020-02-20 MED ORDER — HEPARIN SODIUM (PORCINE) 1000 UNIT/ML DIALYSIS
1000.0000 [IU] | INTRAMUSCULAR | Status: DC | PRN
  Filled 2020-02-20: qty 1

## 2020-02-20 NOTE — ED Notes (Signed)
Patient given sandwich tray and gingerale upon request. Patient also given TV remote

## 2020-02-20 NOTE — Progress Notes (Signed)
Pre HD  

## 2020-02-20 NOTE — Progress Notes (Signed)
HD ended, pt tolerated tc.  @ units PRBCs infused.,  UF goal met

## 2020-02-20 NOTE — Progress Notes (Signed)
Transfusion completed

## 2020-02-20 NOTE — Progress Notes (Signed)
Blood trtansfusion

## 2020-02-20 NOTE — Progress Notes (Signed)
Transfusion started

## 2020-02-20 NOTE — Progress Notes (Signed)
Post HD  

## 2020-02-20 NOTE — Progress Notes (Signed)
Patient back from dialysis, I went in to obtain v/s and attempted to get weight on standing scale however patient refused and says he needs to go home because his family is going back to [place] in the morning and he will not have a way to get home. I educated patient that he still not ready to go since he still short of breath, and we need to check his hgb level since he received two units of blood during his dialysis treatment. Patient states " you do not understand, I need to go home". Dr. Jamse Arn notified. Patient wishes to leave AMA. Formed signed, IV and tele box removed, pt escorted via wheelchair.

## 2020-02-20 NOTE — H&P (Addendum)
History and Physical    Jordan [name] TDV:[id number] DOB: Jul 22, 1966 DOA: 02/20/2020  PCP: Jearld Fenton, NP   Patient coming from: Home I have personally briefly reviewed patient's old medical records in [place]  Chief Complaint: Shortness of breath  HPI: Jordan [name] is a 54 y.o. male with medical history significant for ESRD on HD MWF, with documented history of poor adherence with dialysis and AMA discharges most recently 01/31/2020 as well as history of chronic anemia Baseline around 9 and alcohol abuse who presents to the emergency room in respiratory distress requiring nonrebreather with EMS.  O2 sats reportedly 80% on their arrival.  He has a congested cough but denies fever or chills.  Denies chest pain.  He denies missing his last dialysis session and he scheduled for dialysis on the day of arrival  ED Course: BP on arrival 160/110, HR 110, RR 24 O2 sat 94% on nonrebreather.  Serum potassium and bicarb were within normal limits WBC normal.  Hemoglobin 5.8, down from baseline of 8.  Chest x-ray showed cardiomegaly with slight interval worsening in the diffuse pulmonary edema.  EKG showed sinus tachycardia with no acute ST-T wave changes.  Review of Systems: Unable to obtain due to clinical  Past Medical History:  Diagnosis Date  . Anemia   . Aortic atherosclerosis ([place]) 11/12/2019  . CKD (chronic kidney disease)    Stage 5  Dialysis - M/W/F in [place], [place]  . Dyspnea    tx with inhaler when sick  . ED (erectile dysfunction)   . Emphysema of lung ([place]) 11/12/2019  . ETOH abuse   . History of blood transfusion   . Hypertension   . Wears dentures     Past Surgical History:  Procedure Laterality Date  . BASCILIC VEIN TRANSPOSITION Left 08/07/2016   Procedure: LEFT BASILIC VEIN TRANSPOSITION;  Surgeon: Angelia Mould, MD;  Location: [facility];  Service: Vascular;  Laterality: Left;  . CLOSED REDUCTION NASAL FRACTURE N/A 08/11/2019   Procedure: CLOSED  REDUCTION NASAL FRACTURE WITH STABILIZATION;  Surgeon: Irene Limbo, MD;  Location: [facility];  Service: Plastics;  Laterality: N/A;  . COLONOSCOPY N/A 08/12/2016   Procedure: COLONOSCOPY;  Surgeon: Otis Brace, MD;  Location: [facility];  Service: Gastroenterology;  Laterality: N/A;  . ESOPHAGOGASTRODUODENOSCOPY N/A 08/12/2016   Procedure: ESOPHAGOGASTRODUODENOSCOPY (EGD);  Surgeon: Otis Brace, MD;  Location: [facility];  Service: Gastroenterology;  Laterality: N/A;  . INSERTION OF DIALYSIS CATHETER N/A 08/07/2016   Procedure: INSERTION OF TUNNELED DIALYSIS CATHETER;  Surgeon: Angelia Mould, MD;  Location: [facility];  Service: Vascular;  Laterality: N/A;  . LIGATION OF ARTERIOVENOUS  FISTULA Left 09/12/2016   Procedure: BANDING OF LEFT  ARTERIOVENOUS  FISTULA;  Surgeon: Angelia Mould, MD;  Location: [facility];  Service: Vascular;  Laterality: Left;  . LOWER EXTREMITY INTERVENTION Right 12/02/2018   Procedure: LOWER EXTREMITY INTERVENTION;  Surgeon: Algernon Huxley, MD;  Location: [facility];  Service: Cardiovascular;  Laterality: Right;     reports that he has been smoking cigarettes. He has a 20.00 pack-year smoking history. He has never used smokeless tobacco. He reports current alcohol use of about 21.0 standard drinks of alcohol per week. He reports that he does not use drugs.  No Known Allergies  Family History  Problem Relation Age of Onset  . Diabetes Mother   . Kidney failure Mother   . Healthy Father   . Kidney failure Brother   . Healthy Sister   .  Kidney disease Daughter   . Post-traumatic stress disorder Neg Hx   . Bladder Cancer Neg Hx   . Kidney cancer Neg Hx      Prior to Admission medications   Medication Sig Start Date End Date Taking? Authorizing Provider  acetaminophen (TYLENOL) 500 MG tablet Take 1,000 mg by mouth every 6 (six) hours as needed for mild pain or fever.  03/09/19 03/07/20 Yes [provider]  albuterol  (PROVENTIL HFA;VENTOLIN HFA) 108 (90 Base) MCG/ACT inhaler Inhale 2 puffs into the lungs every 4 (four) hours as needed for wheezing or shortness of breath. 10/03/18  Yes Orpah Greek, MD  calcitRIOL (ROCALTROL) 0.5 MCG capsule Take 1 capsule (0.5 mcg total) by mouth every Monday, Wednesday, and Friday with hemodialysis. 10/24/19  Yes Pokhrel, Laxman, MD  calcium acetate (PHOSLO) 667 MG capsule Take 2 capsules (1,334 mg total) by mouth 3 (three) times daily with meals. Patient taking differently: Take 2,001 mg by mouth 3 (three) times daily with meals.  02/12/19  Yes Fritzi Mandes, MD  camphor-menthol [facility]) lotion Apply 1 application topically as needed for itching. 11/23/19  Yes Upstill, Nehemiah Settle, PA-C  hydrOXYzine (ATARAX/VISTARIL) 50 MG tablet Take 50 mg by mouth 3 (three) times daily as needed for anxiety or itching.  08/09/19  Yes [provider]  triamcinolone cream (KENALOG) 0.1 % Apply 1 application topically 2 (two) times daily. Patient taking differently: Apply 1 application topically 2 (two) times daily as needed (itching).  11/23/19  Yes Charlann Lange, PA-C    Physical Exam: Vitals:   02/20/20 0433  BP: (!) 160/110  Pulse: (!) 110  Resp: (!) 24  Temp: 98.7 F (37.1 C)  SpO2: 94%     Vitals:   02/20/20 0433  BP: (!) 160/110  Pulse: (!) 110  Resp: (!) 24  Temp: 98.7 F (37.1 C)  SpO2: 94%    Constitutional: Alert and awake, oriented x3, tachypneic with increased work of breathing eyes: PERLA, EOMI, irises appear normal, anicteric sclera,  ENMT: external ears and nose appear normal, normal hearing       Neck: neck appears normal, no masses, normal ROM, no thyromegaly, no JVD  CVS: S1-S2 clear, regular no murmur rubs or gallops,  , no carotid bruits, pedal pulses palpable,  Respiratory: Bilateral rales, no rhonchi. Respiratory effort normal increased with accessory muscle use.  Abdomen: soft nontender, nondistended, normal bowel sounds, no hepatosplenomegaly, no  hernias Musculoskeletal: : no cyanosis, clubbing , no contractures or atrophy Neuro: Grossly intact, moving all extremities Psych: judgement and insight appear normal, stable mood and affect,  Skin: no rashes or lesions or ulcers, no induration or nodules   Labs on Admission: I have personally reviewed following labs and imaging studies  CBC: Recent Labs  Lab 02/20/20 0420  WBC 6.0  NEUTROABS 4.8  HGB 5.8*  HCT 18.9*  MCV 105.6*  PLT 694*   Basic Metabolic Panel: Recent Labs  Lab 02/20/20 0420  NA 141  K 4.6  CL 100  CO2 22  GLUCOSE 81  BUN 66*  CREATININE 14.77*  CALCIUM 9.3   GFR: CrCl cannot be calculated (Unknown ideal weight.). Liver Function Tests: Recent Labs  Lab 02/20/20 0420  AST 13*  ALT 8  ALKPHOS 100  BILITOT 2.0*  PROT 7.5  ALBUMIN 3.8   No results for input(s): LIPASE, AMYLASE in the last 168 hours. No results for input(s): AMMONIA in the last 168 hours. Coagulation Profile: No results for input(s): INR, PROTIME in the  last 168 hours. Cardiac Enzymes: No results for input(s): CKTOTAL, CKMB, CKMBINDEX, TROPONINI in the last 168 hours. BNP (last 3 results) No results for input(s): PROBNP in the last 8760 hours. HbA1C: No results for input(s): HGBA1C in the last 72 hours. CBG: No results for input(s): GLUCAP in the last 168 hours. Lipid Profile: No results for input(s): CHOL, HDL, LDLCALC, TRIG, CHOLHDL, LDLDIRECT in the last 72 hours. Thyroid Function Tests: No results for input(s): TSH, T4TOTAL, FREET4, T3FREE, THYROIDAB in the last 72 hours. Anemia Panel: No results for input(s): VITAMINB12, FOLATE, FERRITIN, TIBC, IRON, RETICCTPCT in the last 72 hours. Urine analysis:    Component Value Date/Time   COLORURINE YELLOW 01/24/2020 0904   APPEARANCEUR CLEAR 01/24/2020 0904   LABSPEC 1.012 01/24/2020 0904   PHURINE 9.0 (H) 01/24/2020 0904   GLUCOSEU 150 (A) 01/24/2020 0904   HGBUR SMALL (A) 01/24/2020 0904   BILIRUBINUR NEGATIVE  01/24/2020 0904   [place] 01/24/2020 0904   PROTEINUR 100 (A) 01/24/2020 0904   NITRITE NEGATIVE 01/24/2020 0904   LEUKOCYTESUR MODERATE (A) 01/24/2020 0904    Radiological Exams on Admission: DG Chest Port 1 View  Result Date: 02/20/2020 CLINICAL DATA:  Shortness of breath EXAM: PORTABLE CHEST 1 VIEW COMPARISON:  Feb 03, 2020 FINDINGS: Again noted is cardiomegaly. Aortic knob calcifications. Slight interval worsening in the diffuse fluffy interstitial opacity seen throughout both lungs. No pleural effusion. No acute osseous abnormality. IMPRESSION: Slight interval worsening in the diffuse pulmonary edema. Electronically Signed   By: Prudencio Pair M.D.   On: 02/20/2020 04:40    EKG: Independently reviewed.   Assessment/Plan Principal Problem:  1.  Acute pulmonary edema (HCC)   2. Acute respiratory failure with hypoxia (HCC)   3. Acute on chronic combined systolic and diastolic CHF (congestive heart failure) ([place]) -Patient presenting with acute respiratory failure with O2 sats 80% on room air on arrival of EMS with work-up consistent with heart failure exacerbation. -Last EF on file 45 to 50% on 01/24/2020 -Patient denies missing dialysis or cutting his session short -Possibly triggered by severe anemia with hemoglobin 5.8 -IV Lasix -Expect improvement with dialysis -Supplemental oxygen to keep sats over 94%    ESRD on dialysis [facility]) -Nephrology consult for dialysis as scheduled for today  Symptomatic anemia superimposed on Anemia in ESRD (end-stage renal disease) (HCC) -Hemoglobin of 5.8 down from baseline of 8 -Type and cross for transfusion during dialysis    Essential hypertension -Continue home meds pending med rec    DVT prophylaxis: heparin  Code Status: full code  Family Communication:  none  Disposition Plan: Back to previous home environment Consults called: Dr Holley Raring, nephrology Status:obs    Athena Masse MD [facility]     02/20/2020,  5:07 AM

## 2020-02-20 NOTE — Discharge Summary (Signed)
Jordan [name] ZJQ:[id number] DOB: 14-Mar-1966 DOA: 02/20/2020  PCP: Jearld Fenton, NP  Admit date: 02/20/2020  Discharge date: 02/20/2020  Admitted From: Home   disposition: Home   Recommendations for Outpatient Follow-up:   Patient is signing out AMA. Patient needs to be more compliant with his HD appointments.  Home Health: N/A Equipment/Devices: N/A Consultations: Renal Discharge Condition: Improved CODE STATUS: Full Diet Recommendation: Heart Healthy low-salt  Diet Order            Diet renal with fluid restriction Fluid restriction: 1200 mL Fluid; Room service appropriate? Yes; Fluid consistency: Thin  Diet effective now               Chief Complaint  Patient presents with   Shortness of Breath     Brief history of present illness from the day of admission and additional interim summary      Jordan [name] is a 54 y.o. male with medical history significant for ESRD on HD MWF, with documented history of poor adherence with dialysis and AMA discharges most recently 01/31/2020 as well as history of chronic anemia Baseline around 8 and alcohol abuse who presents to the emergency room in respiratory distress requiring nonrebreather with EMS.  O2 sats reportedly 80% on their arrival.  He has a congested cough but denies fever or chills.  Denies chest pain.  He denies missing his last dialysis session and he scheduled for dialysis on the day of arrival  BP on arrival 160/110, HR 110, RR 24 O2 sat 94% on nonrebreather.  Serum potassium and bicarb were within normal limits WBC normal.  Hemoglobin 5.8, down from baseline of 8.  Chest x-ray showed cardiomegaly with slight interval worsening in the diffuse pulmonary edema.  EKG showed sinus tachycardia with no acute ST-T wave changes.                                                                  Hospital Course    Patient was seen by renal and underwent emergent dialysis with transfusion of 2 unit PRBC.  After dialysis patient stated that he wanted to go home.  Notes he has family in town and does not want to be in the hospital.  Notes that shortness of breath is much better and he feels like he has more energy.  I noted that he was still tachypneic and he may need further dialysis in the morning.  Patient refused to stay and said he would sign out AMA as he is done in the past.  He states he is aware of risks of worsening respiratory status.   Discharge diagnosis     Principal Problem:   Acute pulmonary edema (HCC) Active Problems:   ESRD on dialysis [facility])   Essential hypertension   Anemia  in ESRD (end-stage renal disease) ([place])   Symptomatic anemia   Acute respiratory failure with hypoxia (HCC)   Acute on chronic combined systolic and diastolic CHF (congestive heart failure) [facility])    Discharge instructions      Discharge Medications     Follow-up Information    [place] Follow up on 02/28/2020.   Specialty: Cardiology Why: at 8:30am. Enter through the [place] entrance Contact information: [place] [phone]          Major procedures and Radiology Reports - PLEASE review detailed and final reports thoroughly  -        DG Chest 2 View  Result Date: 01/24/2020 CLINICAL DATA:  54 year old male with shortness of breath. EXAM: CHEST - 2 VIEW COMPARISON:  Chest radiograph dated 12/09/2019. FINDINGS: There is cardiomegaly with vascular congestion and probable mild interstitial edema. Pneumonia is not excluded. Clinical correlation is recommended. No focal consolidation, pleural effusion, pneumothorax. Atherosclerotic calcification of the aorta. No acute osseous pathology. IMPRESSION: Cardiomegaly with vascular  congestion and probable mild interstitial edema. Pneumonia is not excluded. Electronically Signed   By: Anner Crete M.D.   On: 01/24/2020 01:59   DG Chest Port 1 View  Result Date: 02/20/2020 CLINICAL DATA:  Shortness of breath EXAM: PORTABLE CHEST 1 VIEW COMPARISON:  Feb 03, 2020 FINDINGS: Again noted is cardiomegaly. Aortic knob calcifications. Slight interval worsening in the diffuse fluffy interstitial opacity seen throughout both lungs. No pleural effusion. No acute osseous abnormality. IMPRESSION: Slight interval worsening in the diffuse pulmonary edema. Electronically Signed   By: Prudencio Pair M.D.   On: 02/20/2020 04:40   DG Chest Portable 1 View  Result Date: 02/03/2020 CLINICAL DATA:  Shortness of breath. EXAM: PORTABLE CHEST 1 VIEW COMPARISON:  Chest x-ray dated Jan 31, 2020. FINDINGS: Unchanged mild cardiomegaly with diffuse interstitial thickening. No focal consolidation, pleural effusion, or pneumothorax. No acute osseous abnormality. IMPRESSION: Unchanged interstitial pulmonary edema. Electronically Signed   By: Titus Dubin M.D.   On: 02/03/2020 10:50   DG Chest Portable 1 View  Result Date: 01/31/2020 CLINICAL DATA:  Shortness of breath EXAM: PORTABLE CHEST 1 VIEW COMPARISON:  Jan 24, 2020 FINDINGS: There is unchanged mild cardiomegaly. There is prominence of the central pulmonary vasculature with mildly increased left airspace opacities seen throughout both, increased from the prior exam. No pleural effusion. No acute osseous abnormality. IMPRESSION: Cardiomegaly and increased diffuse bilateral airspace opacities, likely pulmonary edema, however infectious etiology. Electronically Signed   By: Prudencio Pair M.D.   On: 01/31/2020 21:43   ECHOCARDIOGRAM COMPLETE  Result Date: 01/24/2020    ECHOCARDIOGRAM REPORT   Patient Name:   Jordan [name] Date of Exam: 01/24/2020 Medical Rec #:  [id number]        Height:       64.0 in Accession #:    [id number]       Weight:       157.0 lb  Date of Birth:  Nov 20, 1965        BSA:          1.765 m Patient Age:    75 years         BP:           187/113 mmHg Patient Gender: M                HR:           99  bpm. Exam Location:  Inpatient Procedure: 2D Echo, Color Doppler and Cardiac Doppler Indications:    R55 Syncope  History:        Patient has no prior history of Echocardiogram examinations.                 Risk Factors:Hypertension and ETOH Abuse. ESRD.  Sonographer:    Raquel Sarna Senior RDCS Referring Phys: [phone] [place]  Sonographer Comments: Patient short of breath and very mobile throughout exam. IMPRESSIONS  1. Left ventricular ejection fraction, by estimation, is 45 to 50%. The left ventricle has mildly decreased function. The left ventricle demonstrates global hypokinesis. There is moderate left ventricular hypertrophy. Left ventricular diastolic parameters are consistent with Grade III diastolic dysfunction (restrictive). Elevated left ventricular end-diastolic pressure.  2. Right ventricular systolic function is normal. The right ventricular size is normal. There is severely elevated pulmonary artery systolic pressure.  3. Left atrial size was severely dilated.  4. Right atrial size was severely dilated.  5. The mitral valve is grossly normal. Mild mitral valve regurgitation. No evidence of mitral stenosis.  6. Tricuspid valve regurgitation is moderate.  7. The aortic valve is tricuspid. Aortic valve regurgitation is not visualized. Mild aortic valve sclerosis is present, with no evidence of aortic valve stenosis.  8. The inferior vena cava is dilated in size with <50% respiratory variability, suggesting right atrial pressure of 15 mmHg. Comparison(s): No prior Echocardiogram. FINDINGS  Left Ventricle: Left ventricular ejection fraction, by estimation, is 45 to 50%. The left ventricle has mildly decreased function. The left ventricle demonstrates global hypokinesis. The left ventricular internal cavity size was normal in size. There is   moderate left ventricular hypertrophy. Left ventricular diastolic parameters are consistent with Grade III diastolic dysfunction (restrictive). Elevated left ventricular end-diastolic pressure. Right Ventricle: The right ventricular size is normal. No increase in right ventricular wall thickness. Right ventricular systolic function is normal. There is severely elevated pulmonary artery systolic pressure. The tricuspid regurgitant velocity is 3.45 m/s, and with an assumed right atrial pressure of 15 mmHg, the estimated right ventricular systolic pressure is 01.0 mmHg. Left Atrium: Left atrial size was severely dilated. Right Atrium: Right atrial size was severely dilated. Pericardium: Trivial pericardial effusion is present. Mitral Valve: The mitral valve is grossly normal. Moderate mitral annular calcification. Mild mitral valve regurgitation. No evidence of mitral valve stenosis. Tricuspid Valve: The tricuspid valve is normal in structure. Tricuspid valve regurgitation is moderate . No evidence of tricuspid stenosis. Aortic Valve: The aortic valve is tricuspid. . There is mild thickening and mild calcification of the aortic valve. Aortic valve regurgitation is not visualized. Mild aortic valve sclerosis is present, with no evidence of aortic valve stenosis. There is mild thickening of the aortic valve. There is mild calcification of the aortic valve. Pulmonic Valve: The pulmonic valve was grossly normal. Pulmonic valve regurgitation is trivial. No evidence of pulmonic stenosis. Aorta: The aortic root, ascending aorta and aortic arch are all structurally normal, with no evidence of dilitation or obstruction. Pulmonary Artery: The pulmonary artery is of normal size. Venous: The inferior vena cava is dilated in size with less than 50% respiratory variability, suggesting right atrial pressure of 15 mmHg. IAS/Shunts: No atrial level shunt detected by color flow Doppler. Additional Comments: There is a small pleural  effusion. Moderate ascites is present.  LEFT VENTRICLE PLAX 2D LVIDd:         4.63 cm      Diastology LVIDs:  3.18 cm      LV e' lateral:   9.14 cm/s LV PW:         1.28 cm      LV E/e' lateral: 17.5 LV IVS:        1.49 cm      LV e' medial:    6.53 cm/s LVOT diam:     2.10 cm      LV E/e' medial:  24.5 LV SV:         67 LV SV Index:   38 LVOT Area:     3.46 cm  LV Volumes (MOD) LV vol d, MOD A2C: 142.0 ml LV vol d, MOD A4C: 135.0 ml LV vol s, MOD A2C: 78.2 ml LV vol s, MOD A4C: 74.5 ml LV SV MOD A2C:     63.8 ml LV SV MOD A4C:     135.0 ml LV SV MOD BP:      68.1 ml RIGHT VENTRICLE RV S prime:     10.10 cm/s TAPSE (M-mode): 1.8 cm LEFT ATRIUM             Index       RIGHT ATRIUM           Index LA diam:        4.00 cm 2.27 cm/m  RA Area:     24.20 cm LA Vol (A2C):   91.4 ml 51.78 ml/m RA Volume:   81.00 ml  45.89 ml/m LA Vol (A4C):   85.9 ml 48.67 ml/m LA Biplane Vol: 89.0 ml 50.42 ml/m  AORTIC VALVE LVOT Vmax:   107.00 cm/s LVOT Vmean:  68.700 cm/s LVOT VTI:    0.192 m  AORTA Ao Root diam: 3.10 cm Ao Asc diam:  3.60 cm MITRAL VALVE                 TRICUSPID VALVE MV Area (PHT): 4.49 cm      TR Peak grad:   47.6 mmHg MV Decel Time: 169 msec      TR Vmax:        345.00 cm/s MR Peak grad:    121.0 mmHg MR Mean grad:    93.0 mmHg   SHUNTS MR Vmax:         550.00 cm/s Systemic VTI:  0.19 m MR Vmean:        467.0 cm/s  Systemic Diam: 2.10 cm MR PISA:         6.28 cm MR PISA Eff ROA: 46 mm MR PISA Radius:  1.00 cm MV E velocity: 160.00 cm/s MV A velocity: 46.90 cm/s MV E/A ratio:  3.41 Buford Dresser MD Electronically signed by Buford Dresser MD Signature Date/Time: 01/24/2020/3:05:15 PM    Final     Micro Results    Recent Results (from the past 240 hour(s))  SARS Coronavirus 2 by RT PCR (hospital order, performed in [place] hospital lab) Nasopharyngeal Nasopharyngeal Swab     Status: None   Collection Time: 02/20/20  4:33 AM   Specimen: Nasopharyngeal Swab  Result Value Ref  Range Status   SARS Coronavirus 2 NEGATIVE NEGATIVE Final    Comment: (NOTE) SARS-CoV-2 target nucleic acids are NOT DETECTED. The SARS-CoV-2 RNA is generally detectable in upper and lower respiratory specimens during the acute phase of infection. The lowest concentration of SARS-CoV-2 viral copies this assay can detect is 250 copies / mL. A negative result does not preclude SARS-CoV-2 infection and should not be used as the sole  basis for treatment or other patient management decisions.  A negative result may occur with improper specimen collection / handling, submission of specimen other than nasopharyngeal swab, presence of viral mutation(s) within the areas targeted by this assay, and inadequate number of viral copies (<250 copies / mL). A negative result must be combined with clinical observations, patient history, and epidemiological information. Fact Sheet for Patients:   StrictlyIdeas.no Fact Sheet for Healthcare Providers: BankingDealers.co.za This test is not yet approved or cleared  by the [place] FDA and has been authorized for detection and/or diagnosis of SARS-CoV-2 by FDA under an Emergency Use Authorization (EUA).  This EUA will remain in effect (meaning this test can be used) for the duration of the COVID-19 declaration under Section 564(b)(1) of the Act, 21 U.S.C. section 360bbb-3(b)(1), unless the authorization is terminated or revoked sooner. Performed at [facility], [address], [place], [place] [address]     Today   Subjective    Beatrice Sehgal feels much improved since admission.  Wants to go home [place] despite my recommendation to stay overnight for work for repeat evaluation in the morning and observation status post dialysis and pulmonary edema.  Objective   Blood pressure (!) 167/94, pulse 98, temperature 98.5 F (36.9 C), temperature source Oral, resp. rate 18,  weight 76.1 kg, SpO2 91 %.   Intake/Output Summary (Last 24 hours) at 02/20/2020 1729 Last data filed at 02/20/2020 1515 Gross per 24 hour  Intake 415 ml  Output 2000 ml  Net -1585 ml    Exam General: Patient appears well and in good spirits sitting up in bed in no acute distress.  Eyes: sclera anicteric, conjuctiva mild injection bilaterally CVS: S1-S2, regular  Respiratory:  decreased air entry bilaterally, rales at bases  GI: NABS, soft, NT  LE: No edema.  Neuro: A/O x 3, Moving all extremities equally with normal strength, CN 3-12 intact, grossly nonfocal.  Psych: patient is logical and coherent, judgement and insight are very poor, mood and affect appropriate to situation.    Data Review   CBC w Diff:  Lab Results  Component Value Date   WBC 6.0 02/20/2020   HGB 5.8 (L) 02/20/2020   HCT 18.9 (L) 02/20/2020   PLT 126 (L) 02/20/2020   LYMPHOPCT 8 02/20/2020   MONOPCT 7 02/20/2020   EOSPCT 4 02/20/2020   BASOPCT 1 02/20/2020    CMP:  Lab Results  Component Value Date   NA 141 02/20/2020   K 4.6 02/20/2020   CL 100 02/20/2020   CO2 22 02/20/2020   BUN 66 (H) 02/20/2020   CREATININE 14.77 (H) 02/20/2020   PROT 7.5 02/20/2020   ALBUMIN 3.8 02/20/2020   BILITOT 2.0 (H) 02/20/2020   ALKPHOS 100 02/20/2020   AST 13 (L) 02/20/2020   ALT 8 02/20/2020  .   Total Time in preparing paper work, data evaluation and todays exam - 35 minutes  Vashti Hey M.D on 02/20/2020 at 5:29 PM  [facility]   Office  [phone]

## 2020-02-20 NOTE — Progress Notes (Signed)
HD started. 

## 2020-02-20 NOTE — ED Triage Notes (Addendum)
BIB [facility], pt states SOB for a few days now, pt last dialysis tx Friday, d/t to received dialysis (5/31 1100). Pt with Rales and wheezing in all  Fields. Pt on 80%RA.

## 2020-02-20 NOTE — ED Notes (Signed)
Blood bank relayed at this time they are still working on blood for patient. Will call when ready

## 2020-02-20 NOTE — Progress Notes (Signed)
Patient due for hemodialysis today therefore orders have been prepared.  Currently under observation.

## 2020-02-20 NOTE — ED Provider Notes (Signed)
[facility] Emergency Department Provider Note   ____________________________________________   First MD Initiated Contact with Patient 02/20/20 [phone]     (approximate)  I have reviewed the triage vital signs and the nursing notes.   HISTORY  Chief Complaint SOB   HPI Jordan [name] is a 54 y.o. male brought to the ED via EMS from home with a chief complaint of shortness of breath.  Patient has a history of ESRD on HD M/W/F.  States he was last dialyzed on Friday and is due this morning.  Presents with shortness of breath and chest tightness.  Denies fever, cough, abdominal pain, nausea or vomiting.  Patient produces little urine.       Past Medical History:  Diagnosis Date  . Anemia   . Aortic atherosclerosis ([place]) 11/12/2019  . CKD (chronic kidney disease)    Stage 5  Dialysis - M/W/F in [place], [place]  . Dyspnea    tx with inhaler when sick  . ED (erectile dysfunction)   . Emphysema of lung ([place]) 11/12/2019  . ETOH abuse   . History of blood transfusion   . Hypertension   . Wears dentures     Patient Active Problem List   Diagnosis Date Noted  . 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 Acute on chronic combined systolic and diastolic CHF (congestive heart failure) ([place]) 02/03/2020  . Pulmonary edema 01/31/2020  . Syncopal episodes 01/24/2020  . Macrocytic anemia 01/24/2020  . Hyperkalemia 01/24/2020  . Fluid overload 11/12/2019  . Acute respiratory failure with hypoxia ([place]) 11/12/2019  . Aortic atherosclerosis ([place]) 11/12/2019  . Emphysema of lung ([place]) 11/12/2019  . Acute pulmonary edema ([place]) 10/31/2019  . Acute on chronic congestive heart failure ([place]) 10/22/2019  . Acute respiratory distress 10/06/2019  . Volume overload 10/05/2019  . Hypokalemia 10/05/2019  . ESRD (end stage renal disease) ([place]) 10/01/2019  . Leg mass, right   . Hyperbilirubinemia   . Symptomatic anemia 09/19/2019  . Pruritus  09/19/2019  . Hemoptysis 02/11/2019  . Hypercalcemia 01/19/2019  . Anemia in ESRD (end-stage renal disease) ([place]) 05/17/2018  . Hepatitis B 03/19/2018  . ESRD on dialysis ([place]) 05/26/2017  . Essential hypertension 05/26/2017  . Urinary tract infection, site not specified 10/17/2016  . Coagulation defect, unspecified ([place]) 08/13/2016  . Diarrhea, unspecified 08/13/2016  . Encounter for immunization 08/13/2016  . Headache, unspecified 08/13/2016  . Hypertensive chronic kidney disease with stage 5 chronic kidney disease or end stage renal disease ([place]) 08/13/2016  . Iron deficiency anemia, unspecified 08/13/2016  . Pain, unspecified 08/13/2016  . Moderate protein-calorie malnutrition ([place]) 08/13/2016  . Secondary hyperparathyroidism of renal origin ([place]) 08/13/2016    Past Surgical History:  Procedure Laterality Date  . BASCILIC VEIN TRANSPOSITION Left 08/07/2016   Procedure: LEFT BASILIC VEIN TRANSPOSITION;  Surgeon: Angelia Mould, MD;  Location: [place];  Service: Vascular;  Laterality: Left;  . CLOSED REDUCTION NASAL FRACTURE N/A 08/11/2019   Procedure: CLOSED REDUCTION NASAL FRACTURE WITH STABILIZATION;  Surgeon: Irene Limbo, MD;  Location: [facility];  Service: Plastics;  Laterality: N/A;  . COLONOSCOPY N/A 08/12/2016   Procedure: COLONOSCOPY;  Surgeon: Otis Brace, MD;  Location: [facility];  Service: Gastroenterology;  Laterality: N/A;  . ESOPHAGOGASTRODUODENOSCOPY N/A 08/12/2016   Procedure: ESOPHAGOGASTRODUODENOSCOPY (EGD);  Surgeon: Otis Brace, MD;  Location: [facility];  Service: Gastroenterology;  Laterality: N/A;  . INSERTION OF DIALYSIS CATHETER N/A 08/07/2016   Procedure: INSERTION OF TUNNELED DIALYSIS  CATHETER;  Surgeon: Angelia Mould, MD;  Location: [facility];  Service: Vascular;  Laterality: N/A;  . LIGATION OF ARTERIOVENOUS  FISTULA Left 09/12/2016   Procedure: BANDING OF LEFT  ARTERIOVENOUS  FISTULA;  Surgeon: Angelia Mould, MD;   Location: [facility];  Service: Vascular;  Laterality: Left;  . LOWER EXTREMITY INTERVENTION Right 12/02/2018   Procedure: LOWER EXTREMITY INTERVENTION;  Surgeon: Algernon Huxley, MD;  Location: [facility];  Service: Cardiovascular;  Laterality: Right;    Prior to Admission medications   Medication Sig Start Date End Date Taking? Authorizing Provider  acetaminophen (TYLENOL) 500 MG tablet Take 1,000 mg by mouth every 6 (six) hours as needed for mild pain or fever.  03/09/19 03/07/20  [provider]  albuterol (PROVENTIL HFA;VENTOLIN HFA) 108 (90 Base) MCG/ACT inhaler Inhale 2 puffs into the lungs every 4 (four) hours as needed for wheezing or shortness of breath. 10/03/18   Orpah Greek, MD  calcitRIOL (ROCALTROL) 0.5 MCG capsule Take 1 capsule (0.5 mcg total) by mouth every Monday, Wednesday, and Friday with hemodialysis. 10/24/19   Pokhrel, Corrie Mckusick, MD  calcium acetate (PHOSLO) 667 MG capsule Take 2 capsules (1,334 mg total) by mouth 3 (three) times daily with meals. Patient taking differently: Take 2,001 mg by mouth 3 (three) times daily with meals.  02/12/19   Fritzi Mandes, MD  camphor-menthol [facility]) lotion Apply 1 application topically as needed for itching. 11/23/19   Charlann Lange, PA-C  hydrOXYzine (ATARAX/VISTARIL) 50 MG tablet Take 50 mg by mouth 3 (three) times daily as needed for anxiety or itching.  08/09/19   [provider]  triamcinolone cream (KENALOG) 0.1 % Apply 1 application topically 2 (two) times daily. Patient taking differently: Apply 1 application topically 2 (two) times daily as needed (itching).  11/23/19   Charlann Lange, PA-C    Allergies Patient has no known allergies.  Family History  Problem Relation Age of Onset  . Diabetes Mother   . Kidney failure Mother   . Healthy Father   . Kidney failure Brother   . Healthy Sister   . Kidney disease Daughter   . Post-traumatic stress disorder Neg Hx   . Bladder Cancer Neg Hx   . Kidney cancer  Neg Hx     Social History Social History   Tobacco Use  . Smoking status: Current Every Day Smoker    Packs/day: 0.50    Years: 40.00    Pack years: 20.00    Types: Cigarettes  . Smokeless tobacco: Never Used  Substance Use Topics  . Alcohol use: Yes    Alcohol/week: 21.0 standard drinks    Types: 21 Cans of beer per week    Comment: none since 08/04/16  . Drug use: No    Review of Systems  Constitutional: No fever/chills Eyes: No visual changes. ENT: No sore throat. Cardiovascular: Positive for chest pain. Respiratory: Positive for shortness of breath. Gastrointestinal: No abdominal pain.  No nausea, no vomiting.  No diarrhea.  No constipation. Genitourinary: Negative for dysuria. Musculoskeletal: Negative for back pain. Skin: Negative for rash. Neurological: Negative for headaches, focal weakness or numbness.   ____________________________________________   PHYSICAL EXAM:  VITAL SIGNS: ED Triage Vitals  Enc Vitals Group     BP      Pulse      Resp      Temp      Temp src      SpO2      Weight  Height      Head Circumference      Peak Flow      Pain Score      Pain Loc      Pain Edu?      Excl. in [place]?     Constitutional: Alert and oriented.  Chronically ill appearing and in mild to moderate acute distress. Eyes: Conjunctivae are normal. PERRL. EOMI. Head: Atraumatic. Nose: No congestion/rhinnorhea. Mouth/Throat: Mucous membranes are moist.   Neck: No stridor.   Cardiovascular: Normal rate, regular rhythm. Grossly normal heart sounds.  Good peripheral circulation. Respiratory: Normal respiratory effort.  No retractions. Lungs bilateral rales. Gastrointestinal: Soft and nontender. No distention. No abdominal bruits. No CVA tenderness. Musculoskeletal: No lower extremity tenderness nor edema.  No joint effusions. Neurologic:  Normal speech and language. No gross focal neurologic deficits are appreciated.  Skin:  Skin is warm, dry and intact. No  rash noted. Psychiatric: Mood and affect are normal. Speech and behavior are normal.  ____________________________________________   LABS (all labs ordered are listed, but only abnormal results are displayed)  Labs Reviewed  CBC WITH DIFFERENTIAL/PLATELET - Abnormal; Notable for the following components:      Result Value   RBC 1.79 (*)    Hemoglobin 5.8 (*)    HCT 18.9 (*)    MCV 105.6 (*)    RDW 15.8 (*)    Platelets 126 (*)    Lymphs Abs 0.5 (*)    All other components within normal limits  SARS CORONAVIRUS 2 BY RT PCR (HOSPITAL ORDER, [facility])  COMPREHENSIVE METABOLIC PANEL  ETHANOL  TYPE AND SCREEN  PREPARE RBC (CROSSMATCH)  TROPONIN I (HIGH SENSITIVITY)   ____________________________________________  EKG  ED ECG REPORT I, Dalilah Curlin J, the attending physician, personally viewed and interpreted this ECG.   Date: 02/20/2020  EKG Time: 0416  Rate: 109  Rhythm: sinus tachycardia  Axis: Normal  Intervals:none  ST&T Change: Nonspecific  ____________________________________________  RADIOLOGY  ED MD interpretation: Pulmonary edema  Official radiology report(s): DG Chest Port 1 View  Result Date: 02/20/2020 CLINICAL DATA:  Shortness of breath EXAM: PORTABLE CHEST 1 VIEW COMPARISON:  Feb 03, 2020 FINDINGS: Again noted is cardiomegaly. Aortic knob calcifications. Slight interval worsening in the diffuse fluffy interstitial opacity seen throughout both lungs. No pleural effusion. No acute osseous abnormality. IMPRESSION: Slight interval worsening in the diffuse pulmonary edema. Electronically Signed   By: Prudencio Pair M.D.   On: 02/20/2020 04:40    ____________________________________________   PROCEDURES  Procedure(s) performed (including Critical Care):  .1-3 Lead EKG Interpretation Performed by: Paulette Blanch, MD Authorized by: Paulette Blanch, MD     Interpretation: abnormal     ECG rate:  108   ECG rate assessment: tachycardic      Rhythm: sinus tachycardia     Ectopy: none     Conduction: normal   Comments:     Patient placed on cardiac monitor to evaluate for arrhythmias   CRITICAL CARE Performed by: Paulette Blanch   Total critical care time: 45 minutes  Critical care time was exclusive of separately billable procedures and treating other patients.  Critical care was necessary to treat or prevent imminent or life-threatening deterioration.  Critical care was time spent personally by me on the following activities: development of treatment plan with patient and/or surrogate as well as nursing, discussions with consultants, evaluation of patient's response to treatment, examination of patient, obtaining history from patient or surrogate, ordering and  performing treatments and interventions, ordering and review of laboratory studies, ordering and review of radiographic studies, pulse oximetry and re-evaluation of patient's condition.   ____________________________________________   INITIAL IMPRESSION / ASSESSMENT AND PLAN / ED COURSE  As part of my medical decision making, I reviewed the following data within the [place] notes reviewed and incorporated, Labs reviewed, EKG interpreted, Old chart reviewed, Radiograph reviewed, Discussed with admitting physician and Notes from prior ED visits     Jordan [name] was evaluated in Emergency Department on 02/20/2020 for the symptoms described in the history of present illness. He was evaluated in the context of the global COVID-19 pandemic, which necessitated consideration that the patient might be at risk for infection with the SARS-CoV-2 virus that causes COVID-19. Institutional protocols and algorithms that pertain to the evaluation of patients at risk for COVID-19 are in a state of rapid change based on information released by regulatory bodies including the CDC and federal and state organizations. These policies and algorithms were  followed during the patient's care in the ED.    54 year old male presenting with shortness of breath. Differential includes, but is not limited to, viral syndrome, bronchitis including COPD exacerbation, pneumonia, reactive airway disease including asthma, CHF including exacerbation with or without pulmonary/interstitial edema, pneumothorax, ACS, thoracic trauma, and pulmonary embolism.  Will obtain lab work, chest x-ray.  I personally reviewed patient's medical records and see that he has had 3 admissions this month for similar.  Also note history of medical noncompliance.   Clinical Course as of Feb 19 446  Mon Feb 20, 2020  0938 H/H noted which is a drop from 8/28 2 weeks ago.  Will transfuse PRBCs.  Will discuss with hospitalist services for admission.   [JS]    Clinical Course User Index [JS] Paulette Blanch, MD     ____________________________________________   FINAL CLINICAL IMPRESSION(S) / ED DIAGNOSES  Final diagnoses:  Hypoxia  Shortness of breath  Acute pulmonary edema ([place])  Anemia, unspecified type     ED Discharge Orders    None       Note:  This document was prepared using Dragon voice recognition software and may include unintentional dictation errors.   Paulette Blanch, MD 02/20/20 [phone]

## 2020-02-20 NOTE — Progress Notes (Signed)
Patient admitted to unit. Oriented to room, call bell, and staff. Bed in lowest position. Fall safety plan reviewed. Full assessment to Epic. Skin assessment verified with [place] Telemetry box verification with tele clerk- Box#:[id number]. Will continue to monitor.

## 2020-02-21 ENCOUNTER — Telehealth: Payer: Self-pay | Admitting: Family

## 2020-02-21 LAB — PARATHYROID HORMONE, INTACT (NO CA): PTH: 669 pg/mL — ABNORMAL HIGH (ref 15–65)

## 2020-02-21 NOTE — Discharge Summary (Signed)
Pt left AMA therefore ono medications listed and no final discharge diagnosis will be dictated   Jordan [name] a 54 y.o.malewith medical history significant ofend-stage renal disease on hemodialysis Mondays Wednesdays Fridays, COPD, previous alcohol abuse, hypertension, hyperlipidemia who has been noncompliant with his hemodialysis. Patient apparently missed hemodialysis yesterday. He presented to the ER with shortness of breath cough and tachypnea. Patient was found to be in fluid overload  #1 acute hypoxic respiratory failure secondary to volume overload/pulmonary edema:Secondary to missed dialysis and end-stage renal disease.  - wean to room air. Patient does not wear oxygen on a chronic basis -patient has history of noncompliance to his meds and maintaining dialysis schedule. -He tells me he had a court date and missed dialysis yesterday. He left AMA stating to The nurse that his son got shot.  #2 end-stage renal disease: -Hemodialysis Mondays Wednesdays and Fridays.  -got dialyzed inhouse  #3 essential hypertension:Has hypertensive urgency at the moment.  -patient is not on any blood pressure meds at home. -t on Norvasc 10 mg daily -PRN IV hydralazine.  #4 anemia of chronic disease:Continue to monitor H&H.  #5 medication noncompliance:Counseling provided   Left AMA

## 2020-02-21 NOTE — Telephone Encounter (Signed)
Unable to reach patient regarding his new patient CHF Clinic appointment after his recent hospital discharge on 5/31.   Alyse Low, [place]

## 2020-02-23 LAB — TYPE AND SCREEN
ABO/RH(D): O NEG
Antibody Screen: POSITIVE
Donor AG Type: NEGATIVE
Donor AG Type: NEGATIVE
PT AG Type: NEGATIVE
Unit division: 0
Unit division: 0
Unit division: 0
Unit division: 0

## 2020-02-23 LAB — BPAM RBC
Blood Product Expiration Date: 202106032359
Blood Product Expiration Date: 202106032359
Blood Product Expiration Date: 202106062359
Blood Product Expiration Date: 202106082359
ISSUE DATE / TIME: 202105311327
ISSUE DATE / TIME: 202105311411
Unit Type and Rh: 9500
Unit Type and Rh: 9500
Unit Type and Rh: 9500
Unit Type and Rh: 9500

## 2020-02-24 DIAGNOSIS — N2581 Secondary hyperparathyroidism of renal origin: Secondary | ICD-10-CM | POA: Diagnosis not present

## 2020-02-24 DIAGNOSIS — N186 End stage renal disease: Secondary | ICD-10-CM | POA: Diagnosis not present

## 2020-02-24 DIAGNOSIS — Z992 Dependence on renal dialysis: Secondary | ICD-10-CM | POA: Diagnosis not present

## 2020-02-24 DIAGNOSIS — L299 Pruritus, unspecified: Secondary | ICD-10-CM | POA: Diagnosis not present

## 2020-02-24 DIAGNOSIS — D509 Iron deficiency anemia, unspecified: Secondary | ICD-10-CM | POA: Diagnosis not present

## 2020-02-27 DIAGNOSIS — D509 Iron deficiency anemia, unspecified: Secondary | ICD-10-CM | POA: Diagnosis not present

## 2020-02-27 DIAGNOSIS — N186 End stage renal disease: Secondary | ICD-10-CM | POA: Diagnosis not present

## 2020-02-27 DIAGNOSIS — N2581 Secondary hyperparathyroidism of renal origin: Secondary | ICD-10-CM | POA: Diagnosis not present

## 2020-02-27 DIAGNOSIS — Z992 Dependence on renal dialysis: Secondary | ICD-10-CM | POA: Diagnosis not present

## 2020-02-27 DIAGNOSIS — L299 Pruritus, unspecified: Secondary | ICD-10-CM | POA: Diagnosis not present

## 2020-02-28 ENCOUNTER — Telehealth: Payer: Self-pay | Admitting: Family

## 2020-02-28 ENCOUNTER — Ambulatory Visit: Payer: Medicare Other | Admitting: Family

## 2020-02-28 NOTE — Telephone Encounter (Signed)
Patient did not show for his Heart Failure Clinic appointment on 02/28/20. Will attempt to reschedule.

## 2020-02-29 DIAGNOSIS — L299 Pruritus, unspecified: Secondary | ICD-10-CM | POA: Diagnosis not present

## 2020-02-29 DIAGNOSIS — N186 End stage renal disease: Secondary | ICD-10-CM | POA: Diagnosis not present

## 2020-02-29 DIAGNOSIS — D509 Iron deficiency anemia, unspecified: Secondary | ICD-10-CM | POA: Diagnosis not present

## 2020-02-29 DIAGNOSIS — N2581 Secondary hyperparathyroidism of renal origin: Secondary | ICD-10-CM | POA: Diagnosis not present

## 2020-02-29 DIAGNOSIS — Z992 Dependence on renal dialysis: Secondary | ICD-10-CM | POA: Diagnosis not present

## 2020-03-02 DIAGNOSIS — Z992 Dependence on renal dialysis: Secondary | ICD-10-CM | POA: Diagnosis not present

## 2020-03-02 DIAGNOSIS — L299 Pruritus, unspecified: Secondary | ICD-10-CM | POA: Diagnosis not present

## 2020-03-02 DIAGNOSIS — D509 Iron deficiency anemia, unspecified: Secondary | ICD-10-CM | POA: Diagnosis not present

## 2020-03-02 DIAGNOSIS — N2581 Secondary hyperparathyroidism of renal origin: Secondary | ICD-10-CM | POA: Diagnosis not present

## 2020-03-02 DIAGNOSIS — N186 End stage renal disease: Secondary | ICD-10-CM | POA: Diagnosis not present

## 2020-03-12 ENCOUNTER — Emergency Department: Payer: Medicare Other

## 2020-03-12 ENCOUNTER — Encounter: Payer: Self-pay | Admitting: Emergency Medicine

## 2020-03-12 ENCOUNTER — Other Ambulatory Visit: Payer: Self-pay

## 2020-03-12 ENCOUNTER — Observation Stay
Admission: EM | Admit: 2020-03-12 | Discharge: 2020-03-13 | Disposition: A | Discharge status: Home / Self Care | Payer: Medicare Other | Attending: Internal Medicine | Admitting: Internal Medicine

## 2020-03-12 DIAGNOSIS — J9601 Acute respiratory failure with hypoxia: Secondary | ICD-10-CM

## 2020-03-12 DIAGNOSIS — I5043 Acute on chronic combined systolic (congestive) and diastolic (congestive) heart failure: Secondary | ICD-10-CM | POA: Insufficient documentation

## 2020-03-12 DIAGNOSIS — N186 End stage renal disease: Secondary | ICD-10-CM | POA: Diagnosis not present

## 2020-03-12 DIAGNOSIS — R404 Transient alteration of awareness: Secondary | ICD-10-CM | POA: Diagnosis not present

## 2020-03-12 DIAGNOSIS — R0602 Shortness of breath: Principal | ICD-10-CM | POA: Insufficient documentation

## 2020-03-12 DIAGNOSIS — L299 Pruritus, unspecified: Secondary | ICD-10-CM | POA: Diagnosis not present

## 2020-03-12 DIAGNOSIS — Z9115 Patient's noncompliance with renal dialysis: Secondary | ICD-10-CM | POA: Diagnosis not present

## 2020-03-12 DIAGNOSIS — Z20822 Contact with and (suspected) exposure to covid-19: Secondary | ICD-10-CM | POA: Insufficient documentation

## 2020-03-12 DIAGNOSIS — I132 Hypertensive heart and chronic kidney disease with heart failure and with stage 5 chronic kidney disease, or end stage renal disease: Secondary | ICD-10-CM | POA: Insufficient documentation

## 2020-03-12 DIAGNOSIS — Z992 Dependence on renal dialysis: Secondary | ICD-10-CM | POA: Diagnosis not present

## 2020-03-12 DIAGNOSIS — R0902 Hypoxemia: Secondary | ICD-10-CM | POA: Diagnosis not present

## 2020-03-12 DIAGNOSIS — F17210 Nicotine dependence, cigarettes, uncomplicated: Secondary | ICD-10-CM | POA: Diagnosis not present

## 2020-03-12 DIAGNOSIS — I517 Cardiomegaly: Secondary | ICD-10-CM | POA: Diagnosis not present

## 2020-03-12 DIAGNOSIS — E875 Hyperkalemia: Secondary | ICD-10-CM

## 2020-03-12 DIAGNOSIS — Z79899 Other long term (current) drug therapy: Secondary | ICD-10-CM | POA: Diagnosis not present

## 2020-03-12 DIAGNOSIS — J810 Acute pulmonary edema: Secondary | ICD-10-CM

## 2020-03-12 DIAGNOSIS — J811 Chronic pulmonary edema: Secondary | ICD-10-CM | POA: Diagnosis not present

## 2020-03-12 DIAGNOSIS — Z743 Need for continuous supervision: Secondary | ICD-10-CM | POA: Diagnosis not present

## 2020-03-12 DIAGNOSIS — I509 Heart failure, unspecified: Secondary | ICD-10-CM

## 2020-03-12 DIAGNOSIS — J90 Pleural effusion, not elsewhere classified: Secondary | ICD-10-CM | POA: Diagnosis not present

## 2020-03-12 LAB — BASIC METABOLIC PANEL
Anion gap: 23 — ABNORMAL HIGH (ref 5–15)
BUN: 147 mg/dL — ABNORMAL HIGH (ref 6–20)
CO2: 21 mmol/L — ABNORMAL LOW (ref 22–32)
Calcium: 9.3 mg/dL (ref 8.9–10.3)
Chloride: 96 mmol/L — ABNORMAL LOW (ref 98–111)
Creatinine, Ser: 24.89 mg/dL — ABNORMAL HIGH (ref 0.61–1.24)
GFR calc Af Amer: 2 mL/min — ABNORMAL LOW (ref >60)
GFR calc non Af Amer: 2 mL/min — ABNORMAL LOW (ref >60)
Glucose, Bld: 91 mg/dL (ref 70–99)
Potassium: 5.8 mmol/L — ABNORMAL HIGH (ref 3.5–5.1)
Sodium: 140 mmol/L (ref 135–145)

## 2020-03-12 LAB — CBC
HCT: 25.6 % — ABNORMAL LOW (ref 39.0–52.0)
Hemoglobin: 8.3 g/dL — ABNORMAL LOW (ref 13.0–17.0)
MCH: 33.1 pg (ref 26.0–34.0)
MCHC: 32.4 g/dL (ref 30.0–36.0)
MCV: 102 fL — ABNORMAL HIGH (ref 80.0–100.0)
Platelets: 136 10*3/uL — ABNORMAL LOW (ref 150–400)
RBC: 2.51 MIL/uL — ABNORMAL LOW (ref 4.22–5.81)
RDW: 18.6 % — ABNORMAL HIGH (ref 11.5–15.5)
WBC: 7.7 10*3/uL (ref 4.0–10.5)
nRBC: 0 % (ref 0.0–0.2)

## 2020-03-12 LAB — BRAIN NATRIURETIC PEPTIDE: B Natriuretic Peptide: >4500 pg/mL — ABNORMAL HIGH (ref 0.0–100.0)

## 2020-03-12 LAB — SARS CORONAVIRUS 2 BY RT PCR (HOSPITAL ORDER, PERFORMED IN ~~LOC~~ HOSPITAL LAB): SARS Coronavirus 2: NEGATIVE

## 2020-03-12 IMAGING — CR DG CHEST 2V
1 series · 2 of 2 positions shown · non-contrast
Comparison: [DATE], [DATE]

CLINICAL DATA: Shortness of breath

EXAM:
CHEST - 2 VIEW

[Series 1: dg chest 2 view · 0.14mm/px · 2 of 2 slices shown]
[im 1/2]
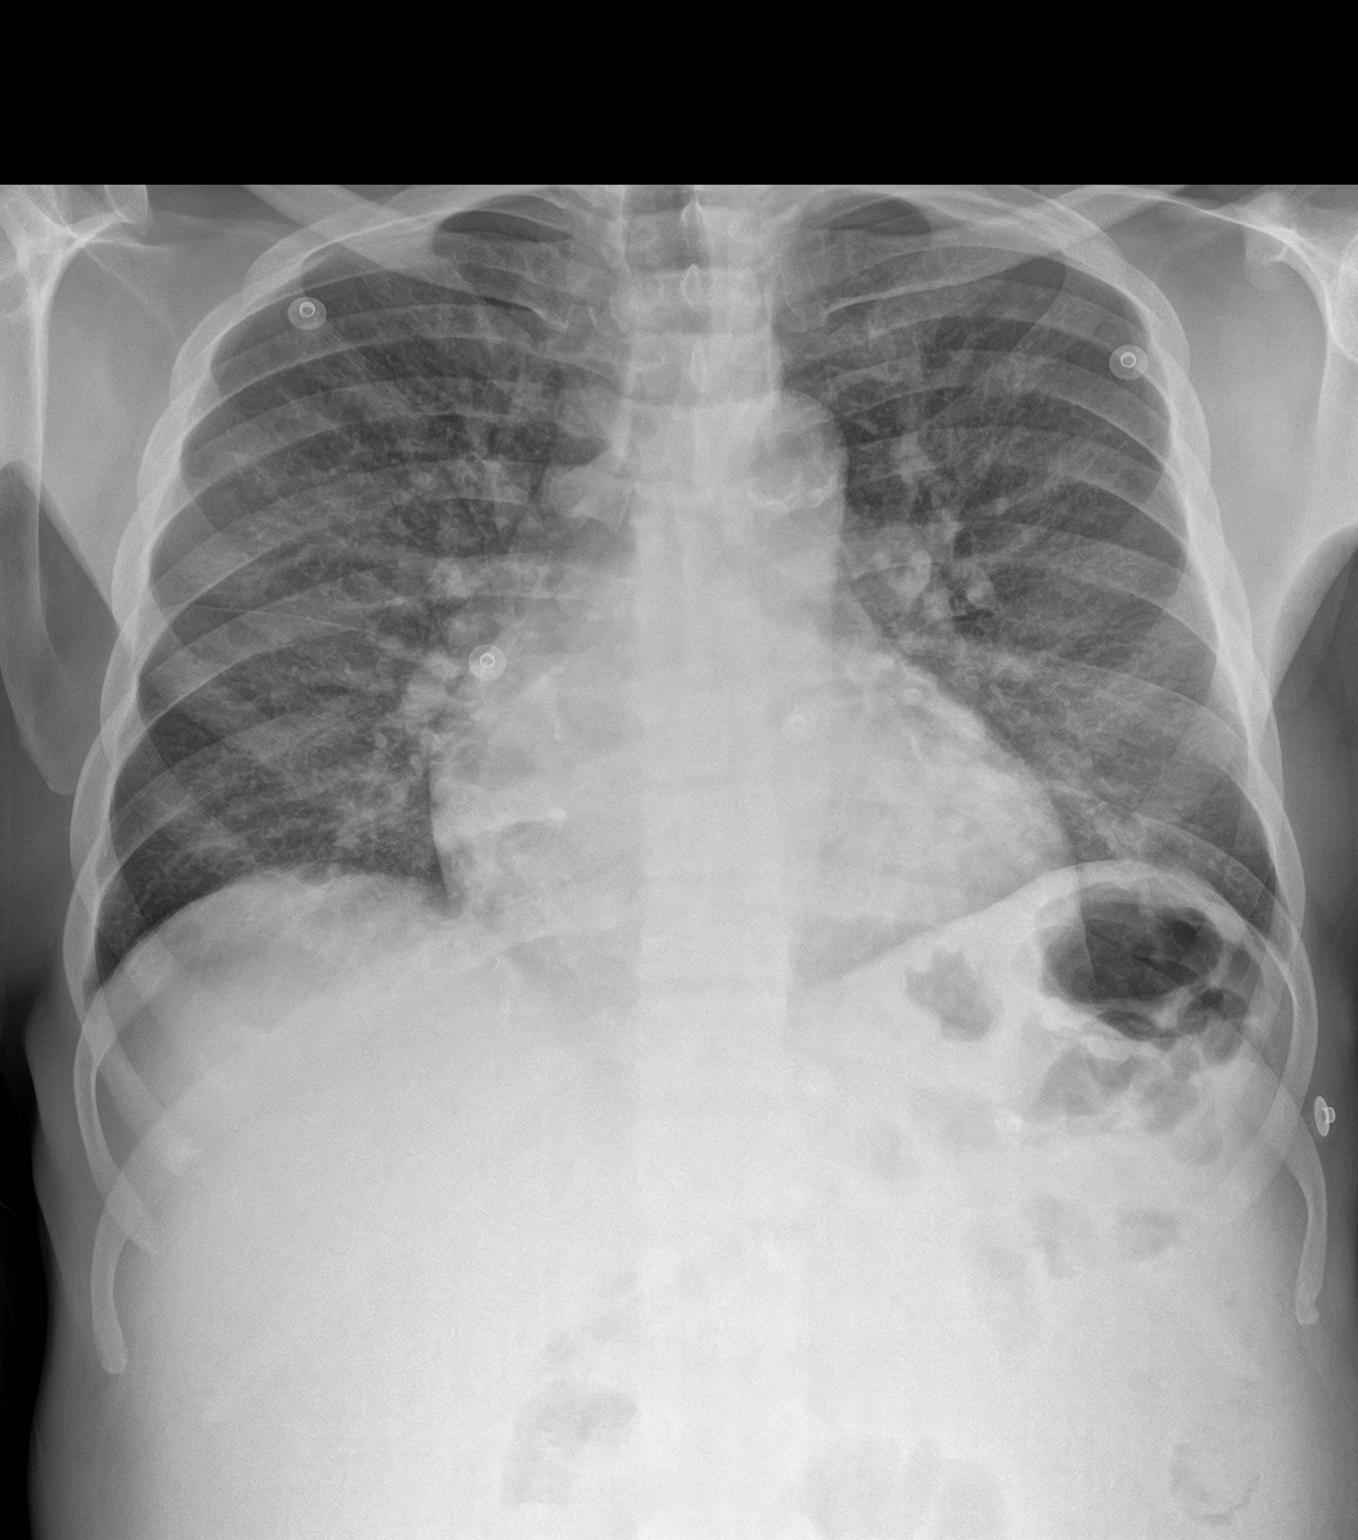
[im 2/2]
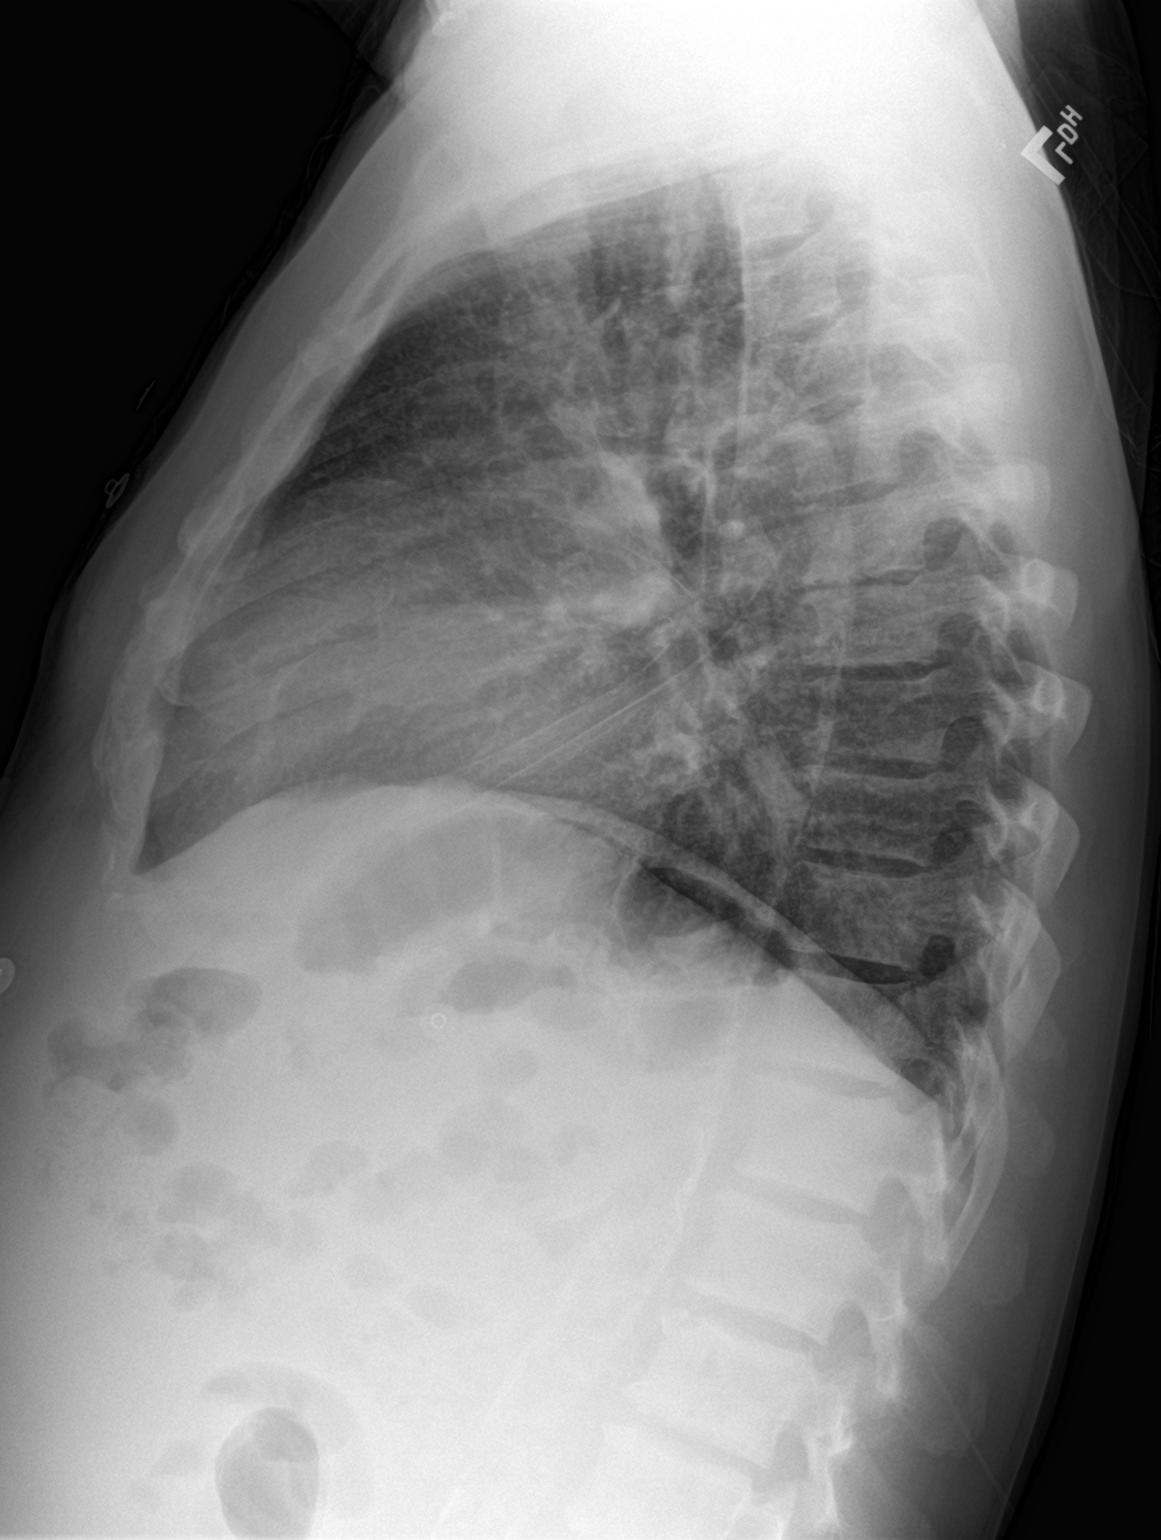

[2 of 2 positions shown; findings below may reference images not displayed]

FINDINGS: No pleural effusion. Mild cardiomegaly with vascular congestion.
Mild bilateral ground-glass opacity. Aortic atherosclerosis. No
pneumothorax.
IMPRESSION: Cardiomegaly with vascular congestion and mild bilateral
ground-glass opacity, probably representing mild pulmonary edema.

## 2020-03-12 MED ORDER — HYDRALAZINE HCL 20 MG/ML IJ SOLN
5.0000 mg | Freq: Once | INTRAMUSCULAR | Status: AC
  Administered 2020-03-12: 5 mg via INTRAVENOUS
  Filled 2020-03-12: qty 1

## 2020-03-12 MED ORDER — CALCIUM GLUCONATE 10 % IV SOLN
1.0000 g | Freq: Once | INTRAVENOUS | Status: AC
  Administered 2020-03-12: 1 g via INTRAVENOUS
  Filled 2020-03-12: qty 10

## 2020-03-12 MED ORDER — ACETAMINOPHEN 325 MG PO TABS: 650.0000 mg | ORAL_TABLET | ORAL | Status: DC | PRN

## 2020-03-12 MED ORDER — ALBUTEROL SULFATE (2.5 MG/3ML) 0.083% IN NEBU: 3.0000 mL | INHALATION_SOLUTION | RESPIRATORY_TRACT | Status: DC | PRN

## 2020-03-12 MED ORDER — DEXTROSE 50 % IV SOLN
1.0000 | Freq: Once | INTRAVENOUS | Status: AC
  Administered 2020-03-12: 50 mL via INTRAVENOUS
  Filled 2020-03-12: qty 50

## 2020-03-12 MED ORDER — SODIUM CHLORIDE 0.9% FLUSH: 3.0000 mL | Freq: Two times a day (BID) | INTRAVENOUS | Status: DC

## 2020-03-12 MED ORDER — ONDANSETRON HCL 4 MG/2ML IJ SOLN: 4.0000 mg | Freq: Four times a day (QID) | INTRAMUSCULAR | Status: DC | PRN

## 2020-03-12 MED ORDER — ALPRAZOLAM 0.5 MG PO TABS: 0.2500 mg | ORAL_TABLET | Freq: Two times a day (BID) | ORAL | Status: DC | PRN

## 2020-03-12 MED ORDER — HYDROXYZINE HCL 25 MG PO TABS
50.0000 mg | ORAL_TABLET | Freq: Three times a day (TID) | ORAL | Status: DC | PRN
  Administered 2020-03-13 (×2): 50 mg via ORAL
  Filled 2020-03-12 (×2): qty 2

## 2020-03-12 MED ORDER — TRIAMCINOLONE ACETONIDE 0.1 % EX CREA
1.0000 "application " | TOPICAL_CREAM | Freq: Two times a day (BID) | CUTANEOUS | Status: DC | PRN
  Administered 2020-03-13: 1 via TOPICAL
  Filled 2020-03-12 (×2): qty 15

## 2020-03-12 MED ORDER — CALCITRIOL 0.25 MCG PO CAPS: 0.5000 ug | ORAL_CAPSULE | ORAL | Status: DC

## 2020-03-12 MED ORDER — HEPARIN SODIUM (PORCINE) 5000 UNIT/ML IJ SOLN
5000.0000 [IU] | Freq: Three times a day (TID) | INTRAMUSCULAR | Status: DC
  Administered 2020-03-13: 5000 [IU] via SUBCUTANEOUS
  Filled 2020-03-12: qty 1

## 2020-03-12 MED ORDER — CALCIUM ACETATE (PHOS BINDER) 667 MG PO CAPS
2001.0000 mg | ORAL_CAPSULE | Freq: Three times a day (TID) | ORAL | Status: DC
  Administered 2020-03-13 (×2): 2001 mg via ORAL
  Filled 2020-03-12 (×4): qty 3

## 2020-03-12 MED ORDER — CAMPHOR-MENTHOL 0.5-0.5 % EX LOTN
1.0000 "application " | TOPICAL_LOTION | CUTANEOUS | Status: DC | PRN
  Filled 2020-03-12: qty 222

## 2020-03-12 MED ORDER — SODIUM CHLORIDE 0.9 % IV SOLN: 250.0000 mL | INTRAVENOUS | Status: DC | PRN

## 2020-03-12 MED ORDER — PATIROMER SORBITEX CALCIUM 8.4 G PO PACK
8.4000 g | PACK | Freq: Every day | ORAL | Status: DC
  Administered 2020-03-12: 8.4 g via ORAL
  Filled 2020-03-12 (×2): qty 1

## 2020-03-12 MED ORDER — INSULIN ASPART 100 UNIT/ML ~~LOC~~ SOLN
8.0000 [IU] | Freq: Once | SUBCUTANEOUS | Status: AC
  Administered 2020-03-12: 8 [IU] via INTRAVENOUS
  Filled 2020-03-12: qty 1

## 2020-03-12 MED ORDER — SODIUM CHLORIDE 0.9% FLUSH: 3.0000 mL | INTRAVENOUS | Status: DC | PRN

## 2020-03-12 MED ORDER — ZOLPIDEM TARTRATE 5 MG PO TABS: 5.0000 mg | ORAL_TABLET | Freq: Every evening | ORAL | Status: DC | PRN

## 2020-03-12 NOTE — H&P (Signed)
[facility] [place] NAME: Jordan [name]    MR#:  [id number]  DATE OF BIRTH:  [birth year]  DATE OF ADMISSION:  03/12/2020  PRIMARY CARE PHYSICIAN: Jearld Fenton, NP   REQUESTING/REFERRING PHYSICIAN: Arta Silence, MD CHIEF COMPLAINT:   Chief Complaint  Patient presents with  . Shortness of Breath    HISTORY OF PRESENT ILLNESS:  Jordan [name]  is a 54 y.o. male with a known history end-stage renal disease on hemodialysis on Monday Wednesday and Friday who missed the last 2 sessions of hemodialysis, hypertension, alcohol abuse, emphysema and presents to the emergency room with acute onset of worsening dyspnea with associated cough and wheezing since last night.  He stated that his son was killed in [place] on Thursday.  He denied any fever however has been having chills.  No chest pain or palpitations.  No dysuria, or frequency or urgency or hematuria or flank pain.  Upon presentation to the emergency room, blood pressure was 158/88 with pulse 70 of 94% on 3 L of O2 by nasal cannula and otherwise normal vital signs.  Labs revealed potassium 5.8, BUN of 147 and creatinine 24.89.  CBC showed anemia close to baseline.  COVID-19 PCR is currently pending.  Last 2D echo revealed an EF of 45 to 50% with global hypokinesia and grade 3 diastolic dysfunction on 11/28/1827.  The patient was given an ampule of 1 g of IV calcium gluconate, D50/insulin, 5 mg of IV hydralazine and 8.4 g p.o. Veltassa.  He will be admitted to an observation progressive unit bed for further evaluation and management. PAST MEDICAL HISTORY:   Past Medical History:  Diagnosis Date  . Anemia   . Aortic atherosclerosis ([place]) 11/12/2019  . CKD (chronic kidney disease)    Stage 5  Dialysis - M/W/F in [place], [place]  . Dyspnea    tx with inhaler when sick  . ED (erectile dysfunction)   . Emphysema of lung ([place]) 11/12/2019  . ETOH abuse   . History of blood transfusion   . Hypertension     . Wears dentures     PAST SURGICAL HISTORY:   Past Surgical History:  Procedure Laterality Date  . BASCILIC VEIN TRANSPOSITION Left 08/07/2016   Procedure: LEFT BASILIC VEIN TRANSPOSITION;  Surgeon: Angelia Mould, MD;  Location: [facility];  Service: Vascular;  Laterality: Left;  . CLOSED REDUCTION NASAL FRACTURE N/A 08/11/2019   Procedure: CLOSED REDUCTION NASAL FRACTURE WITH STABILIZATION;  Surgeon: Irene Limbo, MD;  Location: [facility];  Service: Plastics;  Laterality: N/A;  . COLONOSCOPY N/A 08/12/2016   Procedure: COLONOSCOPY;  Surgeon: Otis Brace, MD;  Location: [facility];  Service: Gastroenterology;  Laterality: N/A;  . ESOPHAGOGASTRODUODENOSCOPY N/A 08/12/2016   Procedure: ESOPHAGOGASTRODUODENOSCOPY (EGD);  Surgeon: Otis Brace, MD;  Location: [facility];  Service: Gastroenterology;  Laterality: N/A;  . INSERTION OF DIALYSIS CATHETER N/A 08/07/2016   Procedure: INSERTION OF TUNNELED DIALYSIS CATHETER;  Surgeon: Angelia Mould, MD;  Location: [facility];  Service: Vascular;  Laterality: N/A;  . LIGATION OF ARTERIOVENOUS  FISTULA Left 09/12/2016   Procedure: BANDING OF LEFT  ARTERIOVENOUS  FISTULA;  Surgeon: Angelia Mould, MD;  Location: [facility];  Service: Vascular;  Laterality: Left;  . LOWER EXTREMITY INTERVENTION Right 12/02/2018   Procedure: LOWER EXTREMITY INTERVENTION;  Surgeon: Algernon Huxley, MD;  Location: [facility];  Service: Cardiovascular;  Laterality: Right;    SOCIAL HISTORY:   Social History  Tobacco Use  . Smoking status: Current Every Day Smoker    Packs/day: 0.50    Years: 40.00    Pack years: 20.00    Types: Cigarettes  . Smokeless tobacco: Never Used  Substance Use Topics  . Alcohol use: Yes    Alcohol/week: 21.0 standard drinks    Types: 21 Cans of beer per week    Comment: none since 08/04/16    FAMILY HISTORY:   Family History  Problem Relation Age of Onset  . Diabetes Mother   . Kidney failure Mother    . Healthy Father   . Kidney failure Brother   . Healthy Sister   . Kidney disease Daughter   . Post-traumatic stress disorder Neg Hx   . Bladder Cancer Neg Hx   . Kidney cancer Neg Hx     DRUG ALLERGIES:  No Known Allergies  REVIEW OF SYSTEMS:   ROS As per history of present illness. All pertinent systems were reviewed above. Constitutional,  HEENT, cardiovascular, respiratory, GI, GU, musculoskeletal, neuro, psychiatric, endocrine,  integumentary and hematologic systems were reviewed and are otherwise  negative/unremarkable except for positive findings mentioned above in the HPI.   MEDICATIONS AT HOME:   Prior to Admission medications   Medication Sig Start Date End Date Taking? Authorizing Provider  albuterol (PROVENTIL HFA;VENTOLIN HFA) 108 (90 Base) MCG/ACT inhaler Inhale 2 puffs into the lungs every 4 (four) hours as needed for wheezing or shortness of breath. 10/03/18   Orpah Greek, MD  calcitRIOL (ROCALTROL) 0.5 MCG capsule Take 1 capsule (0.5 mcg total) by mouth every Monday, Wednesday, and Friday with hemodialysis. 10/24/19   Pokhrel, Corrie Mckusick, MD  calcium acetate (PHOSLO) 667 MG capsule Take 2 capsules (1,334 mg total) by mouth 3 (three) times daily with meals. Patient taking differently: Take 2,001 mg by mouth 3 (three) times daily with meals.  02/12/19   Fritzi Mandes, MD  camphor-menthol [facility]) lotion Apply 1 application topically as needed for itching. 11/23/19   Charlann Lange, PA-C  hydrOXYzine (ATARAX/VISTARIL) 50 MG tablet Take 50 mg by mouth 3 (three) times daily as needed for anxiety or itching.  08/09/19   [provider]  triamcinolone cream (KENALOG) 0.1 % Apply 1 application topically 2 (two) times daily. Patient taking differently: Apply 1 application topically 2 (two) times daily as needed (itching).  11/23/19   Charlann Lange, PA-C      VITAL SIGNS:  Blood pressure (!) 177/85, pulse 96, temperature 98.2 F (36.8 C), temperature source Oral,  resp. rate 19, height 5\' 4"  (1.626 m), weight 78 kg, SpO2 100 %.  PHYSICAL EXAMINATION:  Physical Exam  GENERAL:  54 y.o.-year-old African-American male patient lying in the bed with mild respiratory distress with conversational dyspnea. EYES: Pupils equal, round, reactive to light and accommodation. No scleral icterus. Extraocular muscles intact.  HEENT: Head atraumatic, normocephalic. Oropharynx and nasopharynx clear.  NECK:  Supple, no jugular venous distention. No thyroid enlargement, no tenderness.  LUNGS: Diminished bibasal breath sounds with bibasal rales. CARDIOVASCULAR: Regular rate and rhythm, S1, S2 normal. No murmurs, rubs, or gallops.  ABDOMEN: Soft, nondistended, nontender. Bowel sounds present. No organomegaly or mass.  EXTREMITIES: No pedal edema, cyanosis, or clubbing.  NEUROLOGIC: Cranial nerves II through XII are intact. Muscle strength 5/5 in all extremities. Sensation intact. Gait not checked.  PSYCHIATRIC: The patient is alert and oriented x 3.  Normal affect and good eye contact. SKIN: No obvious rash, lesion, or ulcer.   LABORATORY PANEL:   CBC  Recent Labs  Lab 03/12/20 1643  WBC 7.7  HGB 8.3*  HCT 25.6*  PLT 136*   ------------------------------------------------------------------------------------------------------------------  Chemistries  Recent Labs  Lab 03/12/20 1735  NA 140  K 5.8*  CL 96*  CO2 21*  GLUCOSE 91  BUN 147*  CREATININE 24.89*  CALCIUM 9.3   ------------------------------------------------------------------------------------------------------------------  Cardiac Enzymes No results for input(s): TROPONINI in the last 168 hours. ------------------------------------------------------------------------------------------------------------------  RADIOLOGY:  DG Chest 2 View  Result Date: 03/12/2020 CLINICAL DATA:  Shortness of breath EXAM: CHEST - 2 VIEW COMPARISON:  02/20/2020, 02/03/2020 FINDINGS: No pleural effusion. Mild  cardiomegaly with vascular congestion. Mild bilateral ground-glass opacity. Aortic atherosclerosis. No pneumothorax. IMPRESSION: Cardiomegaly with vascular congestion and mild bilateral ground-glass opacity, probably representing mild pulmonary edema. Electronically Signed   By: Donavan Foil M.D.   On: 03/12/2020 17:24      IMPRESSION AND PLAN:   1.  Acute on chronic combined systolic and diastolic CHF with fluid overload associated with end-stage renal disease on hemodialysis with noncompliance missing the last 2 sessions.  Last EF was 45-50 with grade 3 diastolic dysfunction and global hypokinesia on 01/24/2020. -The patient will be admitted to an observation progressive unit bed. -Nephrology consultation will be obtained for hemodialysis tomorrow. -We will diurese with IV Lasix. -Strict I's and O's. -Daily weights will be obtained. -We will continue his calcitriol and PhosLo.  2. Acute hypoxic respiratory failure secondary to #1. -O2 protocol will be followed. -Management as above.  3.  Hyperkalemia. -This was aggressively managed and will be followed.  4.  COPD with emphysema. -We will continue his albuterol and possible as needed DuoNeb's.  5.  Stable chronic anemia of end-stage renal disease. -H&H will be monitored.  6.  Hypertension. -He will be placed on as needed IV labetalol.  7.  DVT prophylaxis. -Subcutaneous heparin.     All the records are reviewed and case discussed with ED provider. The plan of care was discussed in details with the patient (and family). I answered all questions. The patient agreed to proceed with the above mentioned plan. Further management will depend upon hospital course.   CODE STATUS: Full code  Status is: Observation  The patient remains OBS appropriate and will d/c before 2 midnights.  Dispo: The patient is from: Home              Anticipated d/c is to: Home              Anticipated d/c date is: 1 day              Patient  currently is not medically stable to d/c.    TOTAL TIME TAKING CARE OF THIS PATIENT: 55 minutes.    Christel Mormon M.D on 03/12/2020 at 9:44 PM  [facility]   From 7 PM-7 AM, contact night-coverage [email or web address]  CC: Primary care physician; Jearld Fenton, NP   Note: This dictation was prepared with Dragon dictation along with smaller phrase technology. Any transcriptional typo errors that result from this process are unintentional.

## 2020-03-12 NOTE — ED Notes (Signed)
Pt provided sandwich tray

## 2020-03-12 NOTE — ED Provider Notes (Signed)
[facility] Emergency Department Provider Note ____________________________________________   First MD Initiated Contact with Patient 03/12/20 1949     (approximate)  I have reviewed the triage vital signs and the nursing notes.   HISTORY  Chief Complaint Shortness of Breath    HPI Jordan [name] is a 54 y.o. male with PMH as noted below including ESRD on dialysis, hypertension, and CHF who presents with worsening shortness of breath, gradual onset over the last several days, associated with lower abdominal swelling.  The patient states he feels like he has too much fluid.  He reports a cough productive of clear sputum.  He denies any chest pain, fever chills, vomiting, or diarrhea.  He states that he missed his last 2 dialysis treatments due to travel for a death in the family.  Past Medical History:  Diagnosis Date  . Anemia   . Aortic atherosclerosis ([place]) 11/12/2019  . CKD (chronic kidney disease)    Stage 5  Dialysis - M/W/F in [place], [place]  . Dyspnea    tx with inhaler when sick  . ED (erectile dysfunction)   . Emphysema of lung ([place]) 11/12/2019  . ETOH abuse   . History of blood transfusion   . Hypertension   . Wears dentures     Patient Active Problem List   Diagnosis Date Noted  . Acute CHF (congestive heart failure) ([place]) 03/12/2020  . 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 Acute on chronic combined systolic and diastolic CHF (congestive heart failure) ([place]) 02/03/2020  . Pulmonary edema 01/31/2020  . Syncopal episodes 01/24/2020  . Macrocytic anemia 01/24/2020  . Hyperkalemia 01/24/2020  . Fluid overload 11/12/2019  . Acute respiratory failure with hypoxia ([place]) 11/12/2019  . Aortic atherosclerosis ([place]) 11/12/2019  . Emphysema of lung ([place]) 11/12/2019  . Acute pulmonary edema ([place]) 10/31/2019  . Acute on chronic congestive heart failure ([place]) 10/22/2019  . Acute respiratory distress 10/06/2019   . Volume overload 10/05/2019  . Hypokalemia 10/05/2019  . ESRD (end stage renal disease) ([place]) 10/01/2019  . Leg mass, right   . Hyperbilirubinemia   . Symptomatic anemia 09/19/2019  . Pruritus 09/19/2019  . Hemoptysis 02/11/2019  . Hypercalcemia 01/19/2019  . Anemia in ESRD (end-stage renal disease) ([place]) 05/17/2018  . Hepatitis B 03/19/2018  . ESRD on dialysis ([place]) 05/26/2017  . Essential hypertension 05/26/2017  . Urinary tract infection, site not specified 10/17/2016  . Coagulation defect, unspecified ([place]) 08/13/2016  . Diarrhea, unspecified 08/13/2016  . Encounter for immunization 08/13/2016  . Headache, unspecified 08/13/2016  . Hypertensive chronic kidney disease with stage 5 chronic kidney disease or end stage renal disease ([place]) 08/13/2016  . Iron deficiency anemia, unspecified 08/13/2016  . Pain, unspecified 08/13/2016  . Moderate protein-calorie malnutrition ([place]) 08/13/2016  . Secondary hyperparathyroidism of renal origin ([place]) 08/13/2016    Past Surgical History:  Procedure Laterality Date  . BASCILIC VEIN TRANSPOSITION Left 08/07/2016   Procedure: LEFT BASILIC VEIN TRANSPOSITION;  Surgeon: Angelia Mould, MD;  Location: [facility];  Service: Vascular;  Laterality: Left;  . CLOSED REDUCTION NASAL FRACTURE N/A 08/11/2019   Procedure: CLOSED REDUCTION NASAL FRACTURE WITH STABILIZATION;  Surgeon: Irene Limbo, MD;  Location: [facility];  Service: Plastics;  Laterality: N/A;  . COLONOSCOPY N/A 08/12/2016   Procedure: COLONOSCOPY;  Surgeon: Otis Brace, MD;  Location: [place];  Service: Gastroenterology;  Laterality: N/A;  . ESOPHAGOGASTRODUODENOSCOPY N/A 08/12/2016   Procedure: ESOPHAGOGASTRODUODENOSCOPY (EGD);  Surgeon: Otis Brace,  MD;  Location: [facility];  Service: Gastroenterology;  Laterality: N/A;  . INSERTION OF DIALYSIS CATHETER N/A 08/07/2016   Procedure: INSERTION OF TUNNELED DIALYSIS CATHETER;  Surgeon: Angelia Mould, MD;   Location: [facility];  Service: Vascular;  Laterality: N/A;  . LIGATION OF ARTERIOVENOUS  FISTULA Left 09/12/2016   Procedure: BANDING OF LEFT  ARTERIOVENOUS  FISTULA;  Surgeon: Angelia Mould, MD;  Location: [place];  Service: Vascular;  Laterality: Left;  . LOWER EXTREMITY INTERVENTION Right 12/02/2018   Procedure: LOWER EXTREMITY INTERVENTION;  Surgeon: Algernon Huxley, MD;  Location: [facility];  Service: Cardiovascular;  Laterality: Right;    Prior to Admission medications   Medication Sig Start Date End Date Taking? Authorizing Provider  albuterol (PROVENTIL HFA;VENTOLIN HFA) 108 (90 Base) MCG/ACT inhaler Inhale 2 puffs into the lungs every 4 (four) hours as needed for wheezing or shortness of breath. 10/03/18   Orpah Greek, MD  calcitRIOL (ROCALTROL) 0.5 MCG capsule Take 1 capsule (0.5 mcg total) by mouth every Monday, Wednesday, and Friday with hemodialysis. 10/24/19   Pokhrel, Corrie Mckusick, MD  calcium acetate (PHOSLO) 667 MG capsule Take 2 capsules (1,334 mg total) by mouth 3 (three) times daily with meals. Patient taking differently: Take 2,001 mg by mouth 3 (three) times daily with meals.  02/12/19   Fritzi Mandes, MD  camphor-menthol [facility]) lotion Apply 1 application topically as needed for itching. 11/23/19   Charlann Lange, PA-C  hydrOXYzine (ATARAX/VISTARIL) 50 MG tablet Take 50 mg by mouth 3 (three) times daily as needed for anxiety or itching.  08/09/19   [provider]  triamcinolone cream (KENALOG) 0.1 % Apply 1 application topically 2 (two) times daily. Patient taking differently: Apply 1 application topically 2 (two) times daily as needed (itching).  11/23/19   Charlann Lange, PA-C    Allergies Patient has no known allergies.  Family History  Problem Relation Age of Onset  . Diabetes Mother   . Kidney failure Mother   . Healthy Father   . Kidney failure Brother   . Healthy Sister   . Kidney disease Daughter   . Post-traumatic stress disorder Neg Hx   .  Bladder Cancer Neg Hx   . Kidney cancer Neg Hx     Social History Social History   Tobacco Use  . Smoking status: Current Every Day Smoker    Packs/day: 0.50    Years: 40.00    Pack years: 20.00    Types: Cigarettes  . Smokeless tobacco: Never Used  Vaping Use  . Vaping Use: Never used  Substance Use Topics  . Alcohol use: Yes    Alcohol/week: 21.0 standard drinks    Types: 21 Cans of beer per week    Comment: none since 08/04/16  . Drug use: No    Review of Systems  Constitutional: No fever/chills. Eyes: No redness. ENT: No sore throat. Cardiovascular: Denies chest pain. Respiratory: Positive for shortness of breath. Gastrointestinal: No vomiting or diarrhea.  Genitourinary: Negative for dysuria.  Musculoskeletal: Negative for back pain. Skin: Negative for rash. Neurological: Negative for headache.   ____________________________________________   PHYSICAL EXAM:  VITAL SIGNS: ED Triage Vitals  Enc Vitals Group     BP 03/12/20 1631 (!) 158/88     Pulse Rate 03/12/20 1631 92     Resp 03/12/20 1631 16     Temp 03/12/20 1631 98.2 F (36.8 C)     Temp Source 03/12/20 1631 Oral     SpO2 03/12/20  1631 94 %     Weight 03/12/20 1634 172 lb (78 kg)     Height 03/12/20 1634 5\' 4"  (1.626 m)     Head Circumference --      Peak Flow --      Pain Score --      Pain Loc --      Pain Edu? --      Excl. in [place]? --     Constitutional: Alert and oriented.  Uncomfortable appearing but in no acute distress. Eyes: Conjunctivae are normal.  Head: Atraumatic. Nose: No congestion/rhinnorhea. Mouth/Throat: Mucous membranes are moist.   Neck: Normal range of motion.  Cardiovascular: Normal rate, regular rhythm. Grossly normal heart sounds.  Good peripheral circulation. Respiratory: Slightly increased respiratory effort.  No retractions.  Somewhat diminished breath sounds bilaterally. Gastrointestinal: Soft and nontender.  Mild distention. Genitourinary: No flank  tenderness. Musculoskeletal: Trace bilateral lower extremity edema.  Extremities warm and well perfused.  Neurologic:  Normal speech and language. No gross focal neurologic deficits are appreciated.  Skin:  Skin is warm and dry. No rash noted. Psychiatric: Mood and affect are normal. Speech and behavior are normal.  ____________________________________________   LABS (all labs ordered are listed, but only abnormal results are displayed)  Labs Reviewed  CBC - Abnormal; Notable for the following components:      Result Value   RBC 2.51 (*)    Hemoglobin 8.3 (*)    HCT 25.6 (*)    MCV 102.0 (*)    RDW 18.6 (*)    Platelets 136 (*)    All other components within normal limits  BASIC METABOLIC PANEL - Abnormal; Notable for the following components:   Potassium 5.8 (*)    Chloride 96 (*)    CO2 21 (*)    BUN 147 (*)    Creatinine, Ser 24.89 (*)    GFR calc non Af Amer 2 (*)    GFR calc Af Amer 2 (*)    Anion gap 23 (*)    All other components within normal limits  SARS CORONAVIRUS 2 BY RT PCR (HOSPITAL ORDER, [place] [facility])  BRAIN NATRIURETIC PEPTIDE   ____________________________________________  EKG  ED ECG REPORT I, Arta Silence, the attending physician, personally viewed and interpreted this ECG.  Date: 03/12/2020 EKG Time: 1645 Rate: 93 Rhythm: normal sinus rhythm QRS Axis: normal Intervals: normal ST/T Wave abnormalities: normal Narrative Interpretation: no evidence of acute ischemia  ____________________________________________  RADIOLOGY  CXR: Cardiomegaly with vascular congestion and edema ____________________________________________   PROCEDURES  Procedure(s) performed: No  Procedures  Critical Care performed: No ____________________________________________   INITIAL IMPRESSION / ASSESSMENT AND PLAN / ED COURSE  Pertinent labs & imaging results that were available during my care of the patient were reviewed by me  and considered in my medical decision making (see chart for details).  54 year old male with PMH as noted above including ESRD, hypertension, and CHF presents with worsening shortness of breath, abdominal edema, and concern for fluid overload.  The patient states he missed his last 2 dialysis treatments.  I reviewed the past medical record in Epic.  The patient was most recently admitted last month with pulmonary edema and hypoxia to the low 80s requiring nonrebreather.  He had also missed dialysis at that time.  On exam, the patient is slightly uncomfortable appearing but in no distress.  His vital signs are normal except for hypertension and an O2 saturation in the high 80s to around 90%  on room air.  He has slightly increased work of breathing but no acute respiratory distress.  The remainder of the exam is as described above.  Lab work-up and chest x-ray were obtained from triage.  Chest x-ray reveals vascular congestion and edema.  Lab work-up is significant for hyperkalemia to 5.8, although the patient has no peaked T waves or other EKG changes.  Overall I suspect that the patient's respiratory symptoms are related to pulmonary edema likely from a combination of renal insufficiency and CHF.  I have ordered calcium gluconate, Veltassa, insulin and dextrose for the hyperkalemia.  I will consult nephrology and plan for admission.  ----------------------------------------- 9:00 PM on 03/12/2020 -----------------------------------------  I discussed the case with Dr. Holley Raring from nephrology who in addition to the above treatments recommends hydralazine for the patient's blood pressure.  He advises that the patient will be appropriate for dialysis tomorrow morning.  ----------------------------------------- 9:24 PM on 03/12/2020 -----------------------------------------  I discussed the case with Dr. Sidney Ace from the hospitalist service for  admission. ____________________________________________   FINAL CLINICAL IMPRESSION(S) / ED DIAGNOSES  Final diagnoses:  Acute pulmonary edema (HCC)  Hyperkalemia      NEW MEDICATIONS STARTED DURING THIS VISIT:  New Prescriptions   No medications on file     Note:  This document was prepared using Dragon voice recognition software and may include unintentional dictation errors.    Arta Silence, MD 03/12/20 2125

## 2020-03-12 NOTE — ED Triage Notes (Signed)
Pt comes into the ED via EMS from home with c/o itching and SOB , has not had diaylsis since last Wednesday. Pt is in NAD, 90% on RA, pt is currently on 3LNC. #20gRwrist.

## 2020-03-12 NOTE — ED Triage Notes (Signed)
Patient presents to the ED with increased shortness of breath and generalized swelling.  Patient states he missed his past two dialysis treatments because he had to go out of town, Fridays and today's.  Patient states, "I feel like I have too much fluid on me."  Patient coughing in triage.  Patient states, "I have a lot of phlegm."  Patient states EMS placed patient on 2L of oxygen by [place] due to low oxygen saturation on their arrival to his home.

## 2020-03-12 NOTE — ED Notes (Signed)
See triage note, pt reports "itching all over" and "fluid build up" that started a few days ago. Reports missing 2-3 days of dialysis. No swelling noted to extremities at this time.  Pt reports does not wear oxygen at home.  RR even and unlabored.

## 2020-03-13 DIAGNOSIS — R0602 Shortness of breath: Secondary | ICD-10-CM | POA: Diagnosis not present

## 2020-03-13 DIAGNOSIS — E875 Hyperkalemia: Secondary | ICD-10-CM | POA: Diagnosis not present

## 2020-03-13 DIAGNOSIS — N2581 Secondary hyperparathyroidism of renal origin: Secondary | ICD-10-CM | POA: Diagnosis not present

## 2020-03-13 DIAGNOSIS — I509 Heart failure, unspecified: Secondary | ICD-10-CM | POA: Diagnosis not present

## 2020-03-13 DIAGNOSIS — J810 Acute pulmonary edema: Secondary | ICD-10-CM

## 2020-03-13 DIAGNOSIS — D631 Anemia in chronic kidney disease: Secondary | ICD-10-CM | POA: Diagnosis not present

## 2020-03-13 LAB — BASIC METABOLIC PANEL
Anion gap: 21 — ABNORMAL HIGH (ref 5–15)
BUN: 143 mg/dL — ABNORMAL HIGH (ref 6–20)
CO2: 21 mmol/L — ABNORMAL LOW (ref 22–32)
Calcium: 9.1 mg/dL (ref 8.9–10.3)
Chloride: 99 mmol/L (ref 98–111)
Creatinine, Ser: 24.88 mg/dL — ABNORMAL HIGH (ref 0.61–1.24)
GFR calc Af Amer: 2 mL/min — ABNORMAL LOW (ref >60)
GFR calc non Af Amer: 2 mL/min — ABNORMAL LOW (ref >60)
Glucose, Bld: 114 mg/dL — ABNORMAL HIGH (ref 70–99)
Potassium: 6.1 mmol/L — ABNORMAL HIGH (ref 3.5–5.1)
Sodium: 141 mmol/L (ref 135–145)

## 2020-03-13 LAB — CBC WITH DIFFERENTIAL/PLATELET
Abs Immature Granulocytes: 0.07 10*3/uL (ref 0.00–0.07)
Basophils Absolute: 0 10*3/uL (ref 0.0–0.1)
Basophils Relative: 0 %
Eosinophils Absolute: 0.3 10*3/uL (ref 0.0–0.5)
Eosinophils Relative: 4 %
HCT: 25.5 % — ABNORMAL LOW (ref 39.0–52.0)
Hemoglobin: 8.3 g/dL — ABNORMAL LOW (ref 13.0–17.0)
Immature Granulocytes: 1 %
Lymphocytes Relative: 7 %
Lymphs Abs: 0.6 10*3/uL — ABNORMAL LOW (ref 0.7–4.0)
MCH: 33.2 pg (ref 26.0–34.0)
MCHC: 32.5 g/dL (ref 30.0–36.0)
MCV: 102 fL — ABNORMAL HIGH (ref 80.0–100.0)
Monocytes Absolute: 0.7 10*3/uL (ref 0.1–1.0)
Monocytes Relative: 9 %
Neutro Abs: 6.2 10*3/uL (ref 1.7–7.7)
Neutrophils Relative %: 79 %
Platelets: 135 10*3/uL — ABNORMAL LOW (ref 150–400)
RBC: 2.5 MIL/uL — ABNORMAL LOW (ref 4.22–5.81)
RDW: 18.3 % — ABNORMAL HIGH (ref 11.5–15.5)
WBC: 7.8 10*3/uL (ref 4.0–10.5)
nRBC: 0 % (ref 0.0–0.2)

## 2020-03-13 LAB — POTASSIUM: Potassium: 4.1 mmol/L (ref 3.5–5.1)

## 2020-03-13 MED ORDER — CHLORHEXIDINE GLUCONATE CLOTH 2 % EX PADS
6.0000 | MEDICATED_PAD | Freq: Every day | CUTANEOUS | Status: DC
  Filled 2020-03-13 (×2): qty 6

## 2020-03-13 MED ORDER — DIPHENHYDRAMINE HCL 50 MG/ML IJ SOLN
25.0000 mg | Freq: Once | INTRAMUSCULAR | Status: AC
  Administered 2020-03-13: 25 mg via INTRAVENOUS

## 2020-03-13 MED ORDER — PATIROMER SORBITEX CALCIUM 8.4 G PO PACK: 8.4000 g | PACK | Freq: Every day | ORAL | 0 refills | Status: DC

## 2020-03-13 NOTE — ED Notes (Signed)
Pt set up with lunch tray

## 2020-03-13 NOTE — ED Notes (Signed)
Patients sheets changed and new blankets given per request

## 2020-03-13 NOTE — ED Notes (Signed)
Pt given crackers and applesauce upon request

## 2020-03-13 NOTE — Discharge Summary (Signed)
Physician Discharge Summary  Jordan [name]:[id number] DOB: 1966-08-10 DOA: 03/12/2020  PCP: Jearld Fenton, NP  Admit date: 03/12/2020 Discharge date: 03/13/2020  Admitted From: Home Disposition: Home  Recommendations for Outpatient Follow-up:  1. Follow up with PCP in 1-2 weeks 2. Please obtain BMP/CBC in one week 3. Please follow up on the following pending results: None  Home Health: No Equipment/Devices: None Discharge Condition: Fair CODE STATUS: Full Diet recommendation: Heart Healthy / Carb Modified   Brief/Interim Summary: Jordan [name]  is a 54 y.o. male with a known history end-stage renal disease on hemodialysis on Monday Wednesday and Friday who missed the last 2 sessions of hemodialysis, hypertension, alcohol abuse, emphysema and presents to the emergency room with acute onset of worsening dyspnea with associated cough and wheezing since last night.  He stated that his son was killed in [place] on Thursday. On presentation he was mildly hypoxic.  Potassium at 5.8 which increased to 6.1 next day morning despite given Veltassa, calcium gluconate and insulin. Patient was dialyzed next morning.  Potassium before discharge was 4.1.   Patient was planning to go to [place].  He was advised to arrange travel dialysis before leaving.  He was discharged home and will continue rest of his home medications. He was instructed to follow-up with outpatient dialysis.  Discharge Diagnoses:  Active Problems:   Acute CHF (congestive heart failure) [facility])   Discharge Instructions  Discharge Instructions    Diet - low sodium heart healthy   Complete by: As directed    Discharge instructions   Complete by: As directed    It was pleasure taking care of you. It is very important that you go for your dialysis regularly.  If you are planning to travel out of state please arrange your travel dialysis before leaving.   Increase activity slowly   Complete by: As directed       Allergies as of 03/13/2020   No Known Allergies     Medication List    TAKE these medications   albuterol 108 (90 Base) MCG/ACT inhaler Commonly known as: VENTOLIN HFA Inhale 2 puffs into the lungs every 4 (four) hours as needed for wheezing or shortness of breath.   calcitRIOL 0.5 MCG capsule Commonly known as: ROCALTROL Take 1 capsule (0.5 mcg total) by mouth every Monday, Wednesday, and Friday with hemodialysis.   calcium acetate 667 MG capsule Commonly known as: PHOSLO Take 2 capsules (1,334 mg total) by mouth 3 (three) times daily with meals. What changed: how much to take   Fosrenol 1000 MG Pack Generic drug: Lanthanum Carbonate Take 2 packets by mouth See admin instructions. MIX 2 PACKETS WITH SMALL AMOUNT OF APPLESAUCE OR SIMILAR FOOD. EAT IMMEDIATELY 3 TIMES/DAY WITH MEALS AND 1 PACKET WITH SNACKS   hydrOXYzine 50 MG tablet Commonly known as: ATARAX/VISTARIL Take 50 mg by mouth 3 (three) times daily as needed for anxiety or itching.   patiromer 8.4 g packet Commonly known as: VELTASSA Take 1 packet (8.4 g total) by mouth daily. Start taking on: March 14, 2020   Sarna lotion Generic drug: camphor-menthol Apply 1 application topically as needed for itching.   triamcinolone cream 0.1 % Commonly known as: KENALOG Apply 1 application topically 2 (two) times daily. What changed:   when to take this  reasons to take this       [facility] Follow up on 04/17/2020.   Specialty: Cardiology Why: at  2:00pm. Enter through the [place] entrance Contact information: [place] [address], [place], [place]. Schedule an appointment as soon as possible for a visit.   Specialties: Internal Medicine, Emergency Medicine Contact information: [place] [address] [phone]              No Known  Allergies  Consultations:  Nephrology  Procedures/Studies: DG Chest 2 View  Result Date: 03/12/2020 CLINICAL DATA:  Shortness of breath EXAM: CHEST - 2 VIEW COMPARISON:  02/20/2020, 02/03/2020 FINDINGS: No pleural effusion. Mild cardiomegaly with vascular congestion. Mild bilateral ground-glass opacity. Aortic atherosclerosis. No pneumothorax. IMPRESSION: Cardiomegaly with vascular congestion and mild bilateral ground-glass opacity, probably representing mild pulmonary edema. Electronically Signed   By: Donavan Foil M.D.   On: 03/12/2020 17:24   DG Chest Port 1 View  Result Date: 02/20/2020 CLINICAL DATA:  Shortness of breath EXAM: PORTABLE CHEST 1 VIEW COMPARISON:  Feb 03, 2020 FINDINGS: Again noted is cardiomegaly. Aortic knob calcifications. Slight interval worsening in the diffuse fluffy interstitial opacity seen throughout both lungs. No pleural effusion. No acute osseous abnormality. IMPRESSION: Slight interval worsening in the diffuse pulmonary edema. Electronically Signed   By: Prudencio Pair M.D.   On: 02/20/2020 04:40   Subjective: Patient was little irritable when seen today.  He was going for dialysis.  Patient wants to go to [place] after dialysis.  He told me that his son was killed couple of days ago.  Patient felt better after dialysis.  Discharge Exam: Vitals:   03/13/20 1300 03/13/20 1603  BP: (!) 167/85 (!) 156/86  Pulse: 88 95  Resp: 18 16  Temp: 98.2 F (36.8 C)   SpO2: 99% 94%   Vitals:   03/13/20 1230 03/13/20 1245 03/13/20 1300 03/13/20 1603  BP: 135/87 (!) 155/86 (!) 167/85 (!) 156/86  Pulse: 92 87 88 95  Resp: 20 18 18 16   Temp:   98.2 F (36.8 C)   TempSrc:   Oral   SpO2:   99% 94%  Weight:      Height:        General: Pt is alert, awake, not in acute distress Cardiovascular: RRR, S1/S2 +, no rubs, no gallops Respiratory: CTA bilaterally, no wheezing, no rhonchi Abdominal: Soft, NT, ND, bowel sounds + Extremities: no edema, no cyanosis   The  results of significant diagnostics from this hospitalization (including imaging, microbiology, ancillary and laboratory) are listed below for reference.    Microbiology: Recent Results (from the past 240 hour(s))  SARS Coronavirus 2 by RT PCR (hospital order, performed in [facility] hospital lab) Nasopharyngeal Nasopharyngeal Swab     Status: None   Collection Time: 03/12/20  8:53 PM   Specimen: Nasopharyngeal Swab  Result Value Ref Range Status   SARS Coronavirus 2 NEGATIVE NEGATIVE Final    Comment: (NOTE) SARS-CoV-2 target nucleic acids are NOT DETECTED.  The SARS-CoV-2 RNA is generally detectable in upper and lower respiratory specimens during the acute phase of infection. The lowest concentration of SARS-CoV-2 viral copies this assay can detect is 250 copies / mL. A negative result does not preclude SARS-CoV-2 infection and should not be used as the sole basis for treatment or other patient management decisions.  A negative result may occur with improper specimen collection / handling, submission of specimen other than nasopharyngeal swab, presence of viral mutation(s) within the areas targeted by this assay, and inadequate  number of viral copies (<250 copies / mL). A negative result must be combined with clinical observations, patient history, and epidemiological information.  Fact Sheet for Patients:   StrictlyIdeas.no  Fact Sheet for Healthcare Providers: BankingDealers.co.za  This test is not yet approved or  cleared by the [place] FDA and has been authorized for detection and/or diagnosis of SARS-CoV-2 by FDA under an Emergency Use Authorization (EUA).  This EUA will remain in effect (meaning this test can be used) for the duration of the COVID-19 declaration under Section 564(b)(1) of the Act, 21 U.S.C. section 360bbb-3(b)(1), unless the authorization is terminated or revoked sooner.  Performed at [facility], [address] [place], [place] [address]      Labs: BNP (last 3 results) Recent Labs    02/03/20 1720 02/04/20 0931 03/12/20 1643  BNP 3,674.7* 3,406.5* >8,413.2*   Basic Metabolic Panel: Recent Labs  Lab 03/12/20 1735 03/13/20 0534 03/13/20 1548  NA 140 141  --   K 5.8* 6.1* 4.1  CL 96* 99  --   CO2 21* 21*  --   GLUCOSE 91 114*  --   BUN 147* 143*  --   CREATININE 24.89* 24.88*  --   CALCIUM 9.3 9.1  --    Liver Function Tests: No results for input(s): AST, ALT, ALKPHOS, BILITOT, PROT, ALBUMIN in the last 168 hours. No results for input(s): LIPASE, AMYLASE in the last 168 hours. No results for input(s): AMMONIA in the last 168 hours. CBC: Recent Labs  Lab 03/12/20 1643 03/13/20 0534  WBC 7.7 7.8  NEUTROABS  --  6.2  HGB 8.3* 8.3*  HCT 25.6* 25.5*  MCV 102.0* 102.0*  PLT 136* 135*   Cardiac Enzymes: No results for input(s): CKTOTAL, CKMB, CKMBINDEX, TROPONINI in the last 168 hours. BNP: Invalid input(s): POCBNP CBG: No results for input(s): GLUCAP in the last 168 hours. D-Dimer No results for input(s): DDIMER in the last 72 hours. Hgb A1c No results for input(s): HGBA1C in the last 72 hours. Lipid Profile No results for input(s): CHOL, HDL, LDLCALC, TRIG, CHOLHDL, LDLDIRECT in the last 72 hours. Thyroid function studies No results for input(s): TSH, T4TOTAL, T3FREE, THYROIDAB in the last 72 hours.  Invalid input(s): FREET3 Anemia work up No results for input(s): VITAMINB12, FOLATE, FERRITIN, TIBC, IRON, RETICCTPCT in the last 72 hours. Urinalysis    Component Value Date/Time   COLORURINE YELLOW 01/24/2020 0904   APPEARANCEUR CLEAR 01/24/2020 0904   LABSPEC 1.012 01/24/2020 0904   PHURINE 9.0 (H) 01/24/2020 0904   GLUCOSEU 150 (A) 01/24/2020 0904   HGBUR SMALL (A) 01/24/2020 0904   BILIRUBINUR NEGATIVE 01/24/2020 0904   KETONESUR NEGATIVE 01/24/2020 0904   PROTEINUR 100 (A) 01/24/2020 0904   NITRITE NEGATIVE 01/24/2020 0904    LEUKOCYTESUR MODERATE (A) 01/24/2020 0904   Sepsis Labs Invalid input(s): PROCALCITONIN,  WBC,  LACTICIDVEN Microbiology Recent Results (from the past 240 hour(s))  SARS Coronavirus 2 by RT PCR (hospital order, performed in [facility] hospital lab) Nasopharyngeal Nasopharyngeal Swab     Status: None   Collection Time: 03/12/20  8:53 PM   Specimen: Nasopharyngeal Swab  Result Value Ref Range Status   SARS Coronavirus 2 NEGATIVE NEGATIVE Final    Comment: (NOTE) SARS-CoV-2 target nucleic acids are NOT DETECTED.  The SARS-CoV-2 RNA is generally detectable in upper and lower respiratory specimens during the acute phase of infection. The lowest concentration of SARS-CoV-2 viral copies this assay can detect is 250 copies / mL. A  negative result does not preclude SARS-CoV-2 infection and should not be used as the sole basis for treatment or other patient management decisions.  A negative result may occur with improper specimen collection / handling, submission of specimen other than nasopharyngeal swab, presence of viral mutation(s) within the areas targeted by this assay, and inadequate number of viral copies (<250 copies / mL). A negative result must be combined with clinical observations, patient history, and epidemiological information.  Fact Sheet for Patients:   StrictlyIdeas.no  Fact Sheet for Healthcare Providers: BankingDealers.co.za  This test is not yet approved or  cleared by the [place] FDA and has been authorized for detection and/or diagnosis of SARS-CoV-2 by FDA under an Emergency Use Authorization (EUA).  This EUA will remain in effect (meaning this test can be used) for the duration of the COVID-19 declaration under Section 564(b)(1) of the Act, 21 U.S.C. section 360bbb-3(b)(1), unless the authorization is terminated or revoked sooner.  Performed at [facility], [address] [place], [place]  [address]     Time coordinating discharge: Over 30 minutes  SIGNED:  Lorella Nimrod, MD  [facility] 03/13/2020, 5:06 PM  If 7PM-7AM, please contact night-coverage [email or web address]  This record has been created using Systems analyst. Errors have been sought and corrected,but may not always be located. Such creation errors do not reflect on the standard of care.

## 2020-03-13 NOTE — ED Notes (Signed)
Patient given graham crackers

## 2020-03-13 NOTE — Progress Notes (Signed)
[facility]  ROUNDING NOTE   Subjective:  Patient known to [place] from prior admission. He dialyzes at [facility] on MWF. States his last treatment was on Wednesday. Reports that his son was apparently killed. Potassium still high at 6.1.   Objective:  Vital signs in last 24 hours:  Temp:  [98.2 F (36.8 C)-98.8 F (37.1 C)] 98.8 F (37.1 C) (06/22 0930) Pulse Rate:  [88-104] 95 (06/22 1115) Resp:  [16-24] 24 (06/22 1115) BP: (143-177)/(83-108) 143/95 (06/22 1115) SpO2:  [94 %-100 %] 100 % (06/22 1015) Weight:  [78 kg] 78 kg (06/21 1634)  Weight change:  Filed Weights   03/12/20 1634  Weight: 78 kg    Intake/Output: No intake/output data recorded.   Intake/Output this shift:  Total I/O In: -  Out: 100 [Urine:100]  Physical Exam: General: No acute distress  Head: Normocephalic, atraumatic. Moist oral mucosal membranes  Eyes: Anicteric  Neck: Supple, trachea midline  Lungs:  Basilar rales, normal effort  Heart: S1S2 no rubs  Abdomen:  Soft, distended, bowel sounds present  Extremities: Trace peripheral edema.  Neurologic: Awake, alert, following commands  Skin: No lesions  Access: LUE AVF    Basic Metabolic Panel: Recent Labs  Lab 03/12/20 1735 03/13/20 0534  NA 140 141  K 5.8* 6.1*  CL 96* 99  CO2 21* 21*  GLUCOSE 91 114*  BUN 147* 143*  CREATININE 24.89* 24.88*  CALCIUM 9.3 9.1    Liver Function Tests: No results for input(s): AST, ALT, ALKPHOS, BILITOT, PROT, ALBUMIN in the last 168 hours. No results for input(s): LIPASE, AMYLASE in the last 168 hours. No results for input(s): AMMONIA in the last 168 hours.  CBC: Recent Labs  Lab 03/12/20 1643 03/13/20 0534  WBC 7.7 7.8  NEUTROABS  --  6.2  HGB 8.3* 8.3*  HCT 25.6* 25.5*  MCV 102.0* 102.0*  PLT 136* 135*    Cardiac Enzymes: No results for input(s): CKTOTAL, CKMB, CKMBINDEX, TROPONINI in the last 168 hours.  BNP: Invalid input(s): POCBNP  CBG: No results  for input(s): GLUCAP in the last 168 hours.  Microbiology: Results for orders placed or performed during the hospital encounter of 03/12/20  SARS Coronavirus 2 by RT PCR (hospital order, performed in [facility] hospital lab) Nasopharyngeal Nasopharyngeal Swab     Status: None   Collection Time: 03/12/20  8:53 PM   Specimen: Nasopharyngeal Swab  Result Value Ref Range Status   SARS Coronavirus 2 NEGATIVE NEGATIVE Final    Comment: (NOTE) SARS-CoV-2 target nucleic acids are NOT DETECTED.  The SARS-CoV-2 RNA is generally detectable in upper and lower respiratory specimens during the acute phase of infection. The lowest concentration of SARS-CoV-2 viral copies this assay can detect is 250 copies / mL. A negative result does not preclude SARS-CoV-2 infection and should not be used as the sole basis for treatment or other patient management decisions.  A negative result may occur with improper specimen collection / handling, submission of specimen other than nasopharyngeal swab, presence of viral mutation(s) within the areas targeted by this assay, and inadequate number of viral copies (<250 copies / mL). A negative result must be combined with clinical observations, patient history, and epidemiological information.  Fact Sheet for Patients:   StrictlyIdeas.no  Fact Sheet for Healthcare Providers: BankingDealers.co.za  This test is not yet approved or  cleared by the [place] FDA and has been authorized for detection and/or diagnosis of SARS-CoV-2 by FDA under an Emergency Use Authorization (EUA).  This EUA will remain in effect (meaning this test can be used) for the duration of the COVID-19 declaration under Section 564(b)(1) of the Act, 21 U.S.C. section 360bbb-3(b)(1), unless the authorization is terminated or revoked sooner.  Performed at [facility], [address] [place], [place] [address]     Coagulation  Studies: No results for input(s): LABPROT, INR in the last 72 hours.  Urinalysis: No results for input(s): COLORURINE, LABSPEC, PHURINE, GLUCOSEU, HGBUR, BILIRUBINUR, KETONESUR, PROTEINUR, UROBILINOGEN, NITRITE, LEUKOCYTESUR in the last 72 hours.  Invalid input(s): APPERANCEUR    Imaging: DG Chest 2 View  Result Date: 03/12/2020 CLINICAL DATA:  Shortness of breath EXAM: CHEST - 2 VIEW COMPARISON:  02/20/2020, 02/03/2020 FINDINGS: No pleural effusion. Mild cardiomegaly with vascular congestion. Mild bilateral ground-glass opacity. Aortic atherosclerosis. No pneumothorax. IMPRESSION: Cardiomegaly with vascular congestion and mild bilateral ground-glass opacity, probably representing mild pulmonary edema. Electronically Signed   By: Donavan Foil M.D.   On: 03/12/2020 17:24     Medications:   . sodium chloride     . [START ON 03/14/2020] calcitRIOL  0.5 mcg Oral Q M,W,F-HD  . calcium acetate  2,001 mg Oral TID WC  . Chlorhexidine Gluconate Cloth  6 each Topical Q0600  . heparin  5,000 Units Subcutaneous Q8H  . patiromer  8.4 g Oral Daily  . sodium chloride flush  3 mL Intravenous Q12H   sodium chloride, acetaminophen, albuterol, ALPRAZolam, camphor-menthol, hydrOXYzine, ondansetron (ZOFRAN) IV, sodium chloride flush, triamcinolone cream, zolpidem  Assessment/ Plan:  54 y.o. male with past medical history of ESRD on HD MWF, dialysis schedule nonadherence, anemia of chronic kidney disease, secondary hyperparathyroidism, alcohol abuse, erectile dysfunction, hypertension who presented after missed dialysis sessions.   [facility]/MWF/[facility]  1.  Hyperkalemia.  Serum potassium up to 6.1.  Secondary to nonadherence with dialysis schedule.  We will plan to perform dialysis treatment today using 2K bath.  2.  Pulmonary edema in the setting of ESRD.  Patient with mild pulmonary edema.  We will plan for ultrafiltration with dialysis treatment today.  3.  Anemia of chronic  kidney disease.  Hemoglobin 8.3.  He will resume Epogen as an outpatient tomorrow.  4.  Secondary hyperparathyroidism.  Phosphorus high at 6.4.  Maintain the patient on calcium acetate 4 tablets p.o. 3 times daily with meals.   LOS: 0 Jinelle Butchko 6/22/202111:31 AM

## 2020-03-13 NOTE — ED Notes (Signed)
Patient complaining of itching. Kenalog cream ordered with no good results. Awaiting Vistaril order to be verified by pharmacy. Patient disconnected all leads for tele. Patient refuses to wear for duration of shift.

## 2020-03-13 NOTE — ED Notes (Signed)
MD Lorella Nimrod made aware of "Patient reports the cream for itching has not helped. He is livid and yelling he needs something besides a cream. Also requesting to be transfered to another hospital and would like to see the doctor himself"

## 2020-03-13 NOTE — ED Notes (Signed)
Pt given warm blanket.

## 2020-03-13 NOTE — ED Notes (Signed)
E-signature not working at this time. Pt verbalized understanding of D/C instructions, prescriptions and follow up care with no further questions at this time. Pt in NAD and ambulatory at time of D/C.  

## 2020-03-15 ENCOUNTER — Telehealth: Payer: Self-pay | Admitting: Family

## 2020-03-15 NOTE — Telephone Encounter (Signed)
Patients phone has been disconnected so I am unable to reach patient regarding his new patient CHF Clinic appointment that was made for him after his recent hospital discharge.   Alyse Low, [place]

## 2020-03-16 DIAGNOSIS — N2581 Secondary hyperparathyroidism of renal origin: Secondary | ICD-10-CM | POA: Diagnosis not present

## 2020-03-16 DIAGNOSIS — D509 Iron deficiency anemia, unspecified: Secondary | ICD-10-CM | POA: Diagnosis not present

## 2020-03-16 DIAGNOSIS — N186 End stage renal disease: Secondary | ICD-10-CM | POA: Diagnosis not present

## 2020-03-16 DIAGNOSIS — L299 Pruritus, unspecified: Secondary | ICD-10-CM | POA: Diagnosis not present

## 2020-03-16 DIAGNOSIS — Z992 Dependence on renal dialysis: Secondary | ICD-10-CM | POA: Diagnosis not present

## 2020-03-21 ENCOUNTER — Other Ambulatory Visit: Payer: Self-pay

## 2020-03-21 ENCOUNTER — Observation Stay
Admission: EM | Admit: 2020-03-21 | Discharge: 2020-03-22 | Disposition: A | Discharge status: Home / Self Care | Payer: Medicare Other | Attending: Internal Medicine | Admitting: Internal Medicine

## 2020-03-21 ENCOUNTER — Emergency Department: Payer: Medicare Other

## 2020-03-21 DIAGNOSIS — J810 Acute pulmonary edema: Principal | ICD-10-CM | POA: Insufficient documentation

## 2020-03-21 DIAGNOSIS — I132 Hypertensive heart and chronic kidney disease with heart failure and with stage 5 chronic kidney disease, or end stage renal disease: Secondary | ICD-10-CM | POA: Diagnosis not present

## 2020-03-21 DIAGNOSIS — Z992 Dependence on renal dialysis: Secondary | ICD-10-CM | POA: Diagnosis not present

## 2020-03-21 DIAGNOSIS — I120 Hypertensive chronic kidney disease with stage 5 chronic kidney disease or end stage renal disease: Secondary | ICD-10-CM | POA: Diagnosis not present

## 2020-03-21 DIAGNOSIS — R0602 Shortness of breath: Secondary | ICD-10-CM | POA: Diagnosis not present

## 2020-03-21 DIAGNOSIS — Z9115 Patient's noncompliance with renal dialysis: Secondary | ICD-10-CM | POA: Insufficient documentation

## 2020-03-21 DIAGNOSIS — Z20822 Contact with and (suspected) exposure to covid-19: Secondary | ICD-10-CM | POA: Insufficient documentation

## 2020-03-21 DIAGNOSIS — J439 Emphysema, unspecified: Secondary | ICD-10-CM | POA: Diagnosis not present

## 2020-03-21 DIAGNOSIS — R109 Unspecified abdominal pain: Secondary | ICD-10-CM | POA: Diagnosis not present

## 2020-03-21 DIAGNOSIS — R0689 Other abnormalities of breathing: Secondary | ICD-10-CM | POA: Diagnosis not present

## 2020-03-21 DIAGNOSIS — R404 Transient alteration of awareness: Secondary | ICD-10-CM | POA: Diagnosis not present

## 2020-03-21 DIAGNOSIS — D631 Anemia in chronic kidney disease: Secondary | ICD-10-CM | POA: Diagnosis not present

## 2020-03-21 DIAGNOSIS — I5043 Acute on chronic combined systolic (congestive) and diastolic (congestive) heart failure: Secondary | ICD-10-CM | POA: Diagnosis not present

## 2020-03-21 DIAGNOSIS — I5042 Chronic combined systolic (congestive) and diastolic (congestive) heart failure: Secondary | ICD-10-CM | POA: Diagnosis present

## 2020-03-21 DIAGNOSIS — N186 End stage renal disease: Secondary | ICD-10-CM | POA: Insufficient documentation

## 2020-03-21 DIAGNOSIS — R0603 Acute respiratory distress: Secondary | ICD-10-CM

## 2020-03-21 DIAGNOSIS — N529 Male erectile dysfunction, unspecified: Secondary | ICD-10-CM | POA: Diagnosis not present

## 2020-03-21 DIAGNOSIS — R778 Other specified abnormalities of plasma proteins: Secondary | ICD-10-CM

## 2020-03-21 DIAGNOSIS — J9601 Acute respiratory failure with hypoxia: Secondary | ICD-10-CM | POA: Diagnosis not present

## 2020-03-21 DIAGNOSIS — N2581 Secondary hyperparathyroidism of renal origin: Secondary | ICD-10-CM | POA: Diagnosis not present

## 2020-03-21 DIAGNOSIS — Z833 Family history of diabetes mellitus: Secondary | ICD-10-CM | POA: Diagnosis not present

## 2020-03-21 DIAGNOSIS — F17210 Nicotine dependence, cigarettes, uncomplicated: Secondary | ICD-10-CM | POA: Diagnosis not present

## 2020-03-21 DIAGNOSIS — J9600 Acute respiratory failure, unspecified whether with hypoxia or hypercapnia: Secondary | ICD-10-CM | POA: Diagnosis not present

## 2020-03-21 DIAGNOSIS — F1010 Alcohol abuse, uncomplicated: Secondary | ICD-10-CM | POA: Diagnosis not present

## 2020-03-21 DIAGNOSIS — Z79899 Other long term (current) drug therapy: Secondary | ICD-10-CM | POA: Insufficient documentation

## 2020-03-21 DIAGNOSIS — Z743 Need for continuous supervision: Secondary | ICD-10-CM | POA: Diagnosis not present

## 2020-03-21 DIAGNOSIS — I700 Atherosclerosis of aorta: Secondary | ICD-10-CM | POA: Insufficient documentation

## 2020-03-21 DIAGNOSIS — I10 Essential (primary) hypertension: Secondary | ICD-10-CM

## 2020-03-21 DIAGNOSIS — I517 Cardiomegaly: Secondary | ICD-10-CM | POA: Diagnosis not present

## 2020-03-21 LAB — CBC WITH DIFFERENTIAL/PLATELET
Abs Immature Granulocytes: 0.12 10*3/uL — ABNORMAL HIGH (ref 0.00–0.07)
Basophils Absolute: 0 10*3/uL (ref 0.0–0.1)
Basophils Relative: 0 %
Eosinophils Absolute: 0.2 10*3/uL (ref 0.0–0.5)
Eosinophils Relative: 2 %
HCT: 21.9 % — ABNORMAL LOW (ref 39.0–52.0)
Hemoglobin: 6.9 g/dL — ABNORMAL LOW (ref 13.0–17.0)
Immature Granulocytes: 1 %
Lymphocytes Relative: 10 %
Lymphs Abs: 0.9 10*3/uL (ref 0.7–4.0)
MCH: 31.8 pg (ref 26.0–34.0)
MCHC: 31.5 g/dL (ref 30.0–36.0)
MCV: 100.9 fL — ABNORMAL HIGH (ref 80.0–100.0)
Monocytes Absolute: 0.9 10*3/uL (ref 0.1–1.0)
Monocytes Relative: 11 %
Neutro Abs: 6.6 10*3/uL (ref 1.7–7.7)
Neutrophils Relative %: 76 %
Platelets: 140 10*3/uL — ABNORMAL LOW (ref 150–400)
RBC: 2.17 MIL/uL — ABNORMAL LOW (ref 4.22–5.81)
RDW: 17.3 % — ABNORMAL HIGH (ref 11.5–15.5)
WBC: 8.7 10*3/uL (ref 4.0–10.5)
nRBC: 0 % (ref 0.0–0.2)

## 2020-03-21 LAB — BLOOD GAS, ARTERIAL
Acid-base deficit: 3.3 mmol/L — ABNORMAL HIGH (ref 0.0–2.0)
Bicarbonate: 20.8 mmol/L (ref 20.0–28.0)
Delivery systems: POSITIVE
FIO2: 0.5
O2 Saturation: 99.8 %
Patient temperature: 37
pCO2 arterial: 32 mmHg (ref 32.0–48.0)
pH, Arterial: 7.42 (ref 7.350–7.450)
pO2, Arterial: 236 mmHg — ABNORMAL HIGH (ref 83.0–108.0)

## 2020-03-21 LAB — COMPREHENSIVE METABOLIC PANEL
ALT: 21 U/L (ref 0–44)
AST: 12 U/L — ABNORMAL LOW (ref 15–41)
Albumin: 3.5 g/dL (ref 3.5–5.0)
Alkaline Phosphatase: 103 U/L (ref 38–126)
Anion gap: 17 — ABNORMAL HIGH (ref 5–15)
BUN: 71 mg/dL — ABNORMAL HIGH (ref 6–20)
CO2: 23 mmol/L (ref 22–32)
Calcium: 8.4 mg/dL — ABNORMAL LOW (ref 8.9–10.3)
Chloride: 99 mmol/L (ref 98–111)
Creatinine, Ser: 17.04 mg/dL — ABNORMAL HIGH (ref 0.61–1.24)
GFR calc Af Amer: 3 mL/min — ABNORMAL LOW (ref >60)
GFR calc non Af Amer: 3 mL/min — ABNORMAL LOW (ref >60)
Glucose, Bld: 94 mg/dL (ref 70–99)
Potassium: 5.4 mmol/L — ABNORMAL HIGH (ref 3.5–5.1)
Sodium: 139 mmol/L (ref 135–145)
Total Bilirubin: 1.7 mg/dL — ABNORMAL HIGH (ref 0.3–1.2)
Total Protein: 7.3 g/dL (ref 6.5–8.1)

## 2020-03-21 LAB — TROPONIN I (HIGH SENSITIVITY)
Troponin I (High Sensitivity): 322 ng/L (ref <18)
Troponin I (High Sensitivity): 289 ng/L (ref <18)

## 2020-03-21 LAB — PHOSPHORUS: Phosphorus: 8 mg/dL — ABNORMAL HIGH (ref 2.5–4.6)

## 2020-03-21 LAB — PROTIME-INR
INR: 1.4 — ABNORMAL HIGH (ref 0.8–1.2)
Prothrombin Time: 16.7 seconds — ABNORMAL HIGH (ref 11.4–15.2)

## 2020-03-21 LAB — SARS CORONAVIRUS 2 BY RT PCR (HOSPITAL ORDER, PERFORMED IN ~~LOC~~ HOSPITAL LAB): SARS Coronavirus 2: NEGATIVE

## 2020-03-21 LAB — GLUCOSE, CAPILLARY: Glucose-Capillary: 66 mg/dL — ABNORMAL LOW (ref 70–99)

## 2020-03-21 LAB — HEPATITIS B SURFACE ANTIGEN: Hepatitis B Surface Ag: NONREACTIVE

## 2020-03-21 LAB — BRAIN NATRIURETIC PEPTIDE: B Natriuretic Peptide: 3603.5 pg/mL — ABNORMAL HIGH (ref 0.0–100.0)

## 2020-03-21 LAB — MAGNESIUM: Magnesium: 2.3 mg/dL (ref 1.7–2.4)

## 2020-03-21 IMAGING — DX DG CHEST 1V PORT
1 series · 1 of 1 positions shown · non-contrast
Comparison: Nine days ago

CLINICAL DATA: Shortness of breath

EXAM:
PORTABLE CHEST 1 VIEW

[chest ap]
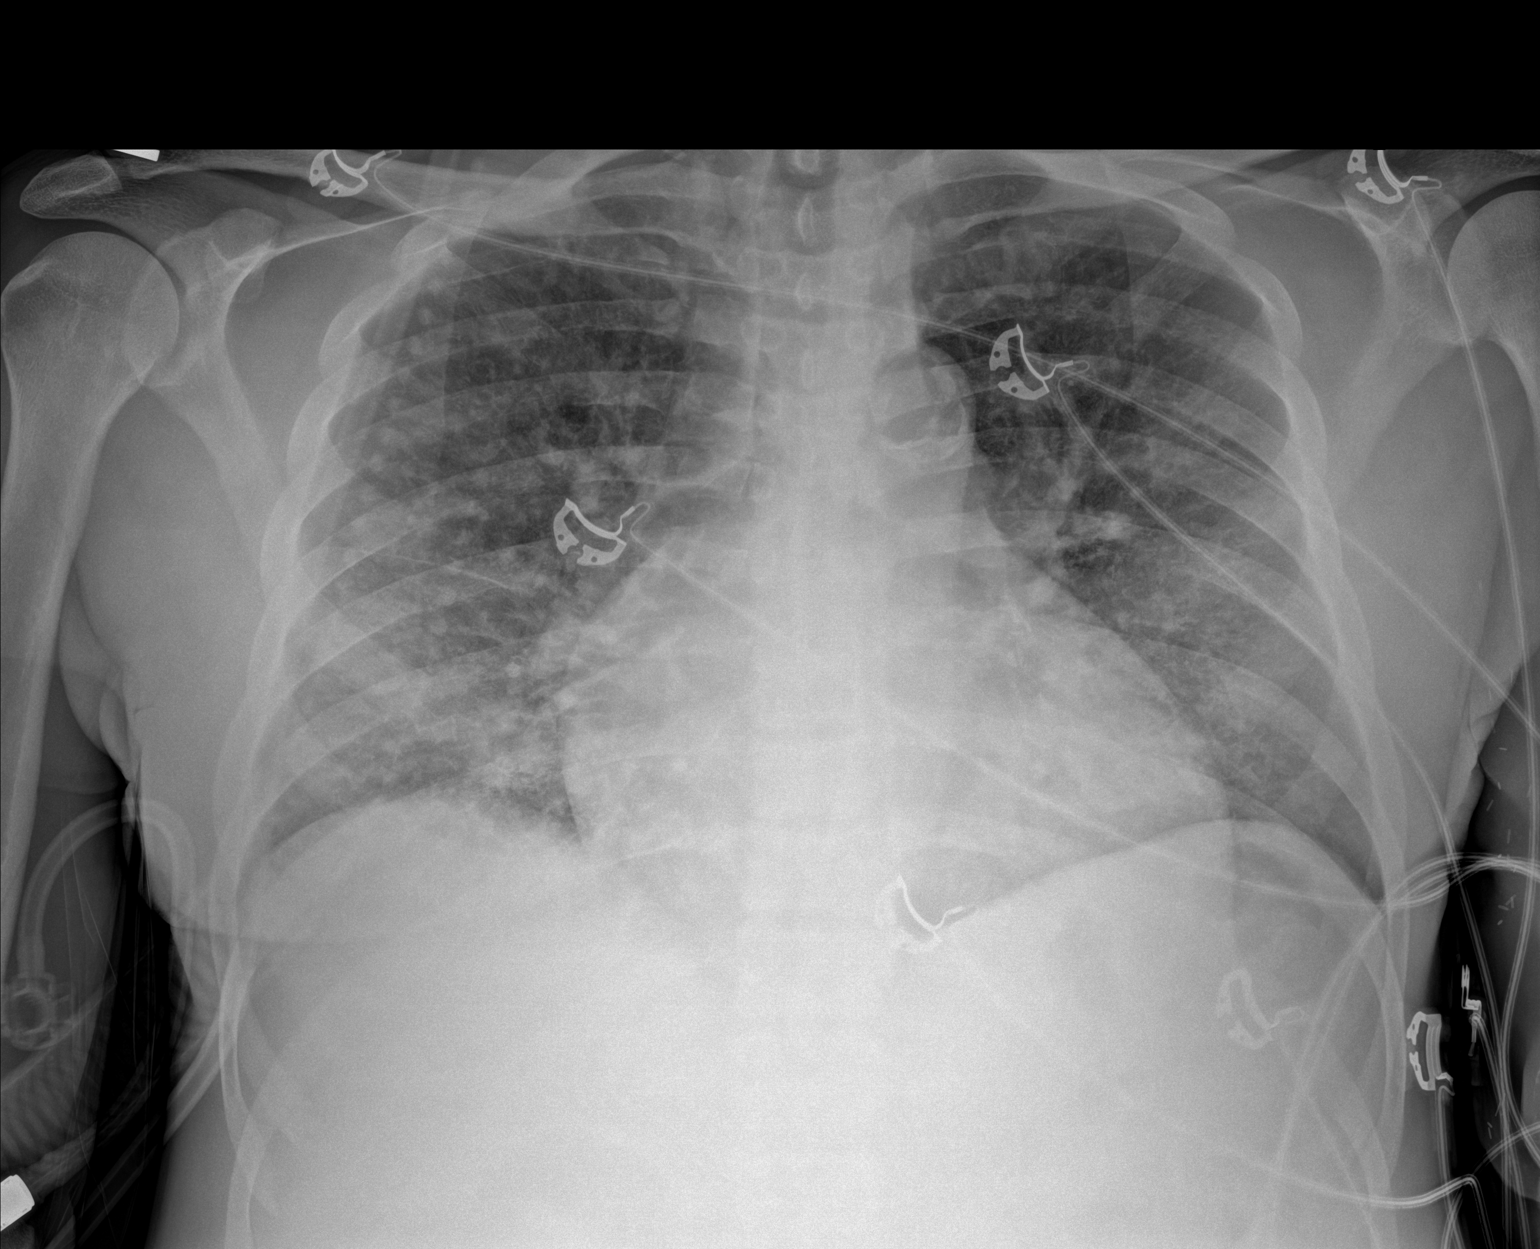

[1 of 1 positions shown; findings below may reference images not displayed]

FINDINGS: Cardiomegaly and vascular congestion. No Kerley lines or effusion.
No pneumothorax or focal consolidation.
IMPRESSION: Cardiomegaly and vascular congestion.

## 2020-03-21 MED ORDER — GABAPENTIN 300 MG PO CAPS
300.0000 mg | ORAL_CAPSULE | Freq: Every day | ORAL | Status: DC
  Administered 2020-03-21: 300 mg via ORAL
  Filled 2020-03-21: qty 1

## 2020-03-21 MED ORDER — HYDROCORTISONE 1 % EX LOTN
TOPICAL_LOTION | Freq: Three times a day (TID) | CUTANEOUS | Status: DC
  Filled 2020-03-21: qty 118

## 2020-03-21 MED ORDER — NITROGLYCERIN 2 % TD OINT
1.0000 [in_us] | TOPICAL_OINTMENT | Freq: Four times a day (QID) | TRANSDERMAL | Status: DC
  Administered 2020-03-21 – 2020-03-22 (×3): 1 [in_us] via TOPICAL
  Filled 2020-03-21 (×4): qty 1

## 2020-03-21 MED ORDER — SODIUM CHLORIDE 0.9% FLUSH: 3.0000 mL | INTRAVENOUS | Status: DC | PRN

## 2020-03-21 MED ORDER — ALPRAZOLAM 0.25 MG PO TABS
0.2500 mg | ORAL_TABLET | Freq: Two times a day (BID) | ORAL | Status: DC | PRN
  Administered 2020-03-21: 0.25 mg via ORAL
  Filled 2020-03-21 (×3): qty 1

## 2020-03-21 MED ORDER — SODIUM CHLORIDE 0.9 % IV SOLN: 250.0000 mL | INTRAVENOUS | Status: DC | PRN

## 2020-03-21 MED ORDER — CALCIUM ACETATE (PHOS BINDER) 667 MG PO CAPS
1334.0000 mg | ORAL_CAPSULE | Freq: Three times a day (TID) | ORAL | Status: DC
  Administered 2020-03-21 – 2020-03-22 (×2): 1334 mg via ORAL
  Filled 2020-03-21 (×5): qty 2

## 2020-03-21 MED ORDER — LANTHANUM CARBONATE 500 MG PO CHEW
1000.0000 mg | CHEWABLE_TABLET | Freq: Three times a day (TID) | ORAL | Status: DC
  Administered 2020-03-21 – 2020-03-22 (×2): 1000 mg via ORAL
  Filled 2020-03-21 (×4): qty 2

## 2020-03-21 MED ORDER — LISINOPRIL 10 MG PO TABS
5.0000 mg | ORAL_TABLET | Freq: Every day | ORAL | Status: DC
  Administered 2020-03-21 – 2020-03-22 (×2): 5 mg via ORAL
  Filled 2020-03-21 (×2): qty 1

## 2020-03-21 MED ORDER — CAMPHOR-MENTHOL 0.5-0.5 % EX LOTN
1.0000 "application " | TOPICAL_LOTION | CUTANEOUS | Status: DC | PRN
  Filled 2020-03-21: qty 222

## 2020-03-21 MED ORDER — HYDRALAZINE HCL 50 MG PO TABS
50.0000 mg | ORAL_TABLET | Freq: Three times a day (TID) | ORAL | Status: DC
  Administered 2020-03-21 – 2020-03-22 (×3): 50 mg via ORAL
  Filled 2020-03-21 (×3): qty 1

## 2020-03-21 MED ORDER — DIPHENHYDRAMINE HCL 50 MG/ML IJ SOLN
25.0000 mg | Freq: Once | INTRAMUSCULAR | Status: AC
  Administered 2020-03-21: 25 mg via INTRAVENOUS

## 2020-03-21 MED ORDER — DIPHENHYDRAMINE HCL 50 MG/ML IJ SOLN: 12.5000 mg | Freq: Once | INTRAMUSCULAR | Status: DC

## 2020-03-21 MED ORDER — PATIROMER SORBITEX CALCIUM 8.4 G PO PACK
8.4000 g | PACK | Freq: Every day | ORAL | Status: DC
  Administered 2020-03-21 – 2020-03-22 (×2): 8.4 g via ORAL
  Filled 2020-03-21 (×3): qty 1

## 2020-03-21 MED ORDER — HEPARIN SODIUM (PORCINE) 5000 UNIT/ML IJ SOLN
5000.0000 [IU] | Freq: Three times a day (TID) | INTRAMUSCULAR | Status: DC
  Administered 2020-03-21 – 2020-03-22 (×3): 5000 [IU] via SUBCUTANEOUS
  Filled 2020-03-21 (×3): qty 1

## 2020-03-21 MED ORDER — SODIUM CHLORIDE 0.9 % IV SOLN: Freq: Once | INTRAVENOUS | Status: DC

## 2020-03-21 MED ORDER — EPOETIN ALFA 10000 UNIT/ML IJ SOLN
10000.0000 [IU] | INTRAMUSCULAR | Status: DC
  Administered 2020-03-21: 10000 [IU] via INTRAVENOUS

## 2020-03-21 MED ORDER — HALOPERIDOL LACTATE 5 MG/ML IJ SOLN
2.0000 mg | Freq: Once | INTRAMUSCULAR | Status: AC
  Administered 2020-03-21: 2 mg via INTRAVENOUS
  Filled 2020-03-21: qty 1

## 2020-03-21 MED ORDER — ONDANSETRON HCL 4 MG/2ML IJ SOLN: 4.0000 mg | Freq: Four times a day (QID) | INTRAMUSCULAR | Status: DC | PRN

## 2020-03-21 MED ORDER — SEVELAMER CARBONATE 800 MG PO TABS
800.0000 mg | ORAL_TABLET | Freq: Three times a day (TID) | ORAL | Status: DC
  Administered 2020-03-21 – 2020-03-22 (×3): 800 mg via ORAL
  Filled 2020-03-21 (×2): qty 1

## 2020-03-21 MED ORDER — TRIAMCINOLONE ACETONIDE 0.1 % EX CREA
1.0000 "application " | TOPICAL_CREAM | Freq: Two times a day (BID) | CUTANEOUS | Status: DC | PRN
  Filled 2020-03-21: qty 15

## 2020-03-21 MED ORDER — DIPHENHYDRAMINE HCL 50 MG/ML IJ SOLN
25.0000 mg | Freq: Four times a day (QID) | INTRAMUSCULAR | Status: DC | PRN
  Administered 2020-03-21: 25 mg via INTRAVENOUS
  Filled 2020-03-21: qty 1

## 2020-03-21 MED ORDER — HYDROXYZINE HCL 25 MG PO TABS
50.0000 mg | ORAL_TABLET | Freq: Three times a day (TID) | ORAL | Status: DC | PRN
  Filled 2020-03-21: qty 1

## 2020-03-21 MED ORDER — ACETAMINOPHEN 325 MG PO TABS: 650.0000 mg | ORAL_TABLET | ORAL | Status: DC | PRN

## 2020-03-21 MED ORDER — SODIUM CHLORIDE 0.9% FLUSH
3.0000 mL | Freq: Two times a day (BID) | INTRAVENOUS | Status: DC
  Administered 2020-03-21 – 2020-03-22 (×3): 3 mL via INTRAVENOUS

## 2020-03-21 NOTE — Progress Notes (Signed)
Hemodialysis patient known at [facility] MWF 11:30am, patient rides with [employer]. Please contact me with any dialysis placement concerns.  Elvera Bicker Dialysis Coordinator [phone]

## 2020-03-21 NOTE — Progress Notes (Signed)
Same day note  Patient seen and examined at bedside.  Patient was admitted to the hospital for shortness of breath.  At the time of my evaluation, patient complains of itching and sleepiness.  Physical examination reveals mildly somnolent male seen during hemodialysis.  Laboratory data and imaging was reviewed  Assessment and Plan.  Acute pulmonary edema  secondary to fluid overload from missed dialysis with Acute respiratory failure with hypoxia/Acute on chronic combined systolic and diastolic CHF  Last known ejection fraction of 45 to 50% with grade 3 diastolic dysfunction.  On Nitropaste.    For hemodialysis today.Marland Kitchen  BiPAP and wean as tolerated  Somnolence likely from Benadryl and benzodiazepines.  Generalized pruritus.  Will resume home medication.  ESRD on dialysis, with missed dialysis sessions Nephrology consult for dialysis.    For hemodialysis today.  Elevated troponin Likely related to demand ischemia from acute respiratory distress.    No chest pain or EKG changes.   Anemia in ESRD (end-stage renal disease) (HCC) Hemoglobin of 6.9 from 8.0.  Type and screen has been performed.  Awaiting for blood transfusion.  Essential hypertension -Continue home meds   No Charge  Signed,  Delila Pereyra, MD [facility]

## 2020-03-21 NOTE — Progress Notes (Signed)
[facility]  ROUNDING NOTE   Subjective:   [name] admitted to [facility] on 03/21/2020 for Acute pulmonary edema ([place]) [J81.0] Respiratory distress [R06.03]  Emergent hemodialysis treatment this morning.     HEMODIALYSIS FLOWSHEET:  Blood Flow Rate (mL/min): 200 mL/min Arterial Pressure (mmHg): -30 mmHg Venous Pressure (mmHg): 10 mmHg Transmembrane Pressure (mmHg): 30 mmHg Ultrafiltration Rate (mL/min): 300 mL/min Dialysate Flow Rate (mL/min): 400 ml/min Conductivity: Machine : 13.8 Conductivity: Machine : 13.8 Dialysis Fluid Bolus: Normal Saline Bolus Amount (mL): 250 mL    Objective:  Vital signs in last 24 hours:  Temp:  [97.6 F (36.4 C)-98.8 F (37.1 C)] 98.8 F (37.1 C) (06/30 1200) Pulse Rate:  [88-108] 99 (06/30 1200) Resp:  [18-32] 18 (06/30 1200) BP: (137-166)/(78-113) 145/83 (06/30 1200) SpO2:  [80 %-100 %] 99 % (06/30 1200) Weight:  [76.8 kg] 76.8 kg (06/30 0312)  Weight change:  Filed Weights   03/21/20 0312  Weight: 76.8 kg    Intake/Output: No intake/output data recorded.   Intake/Output this shift:  Total I/O In: -  Out: 3500 [Other:3500]  Physical Exam: General: NAD,   Head: Normocephalic, atraumatic. Moist oral mucosal membranes  Eyes: Anicteric, PERRL  Neck: Supple, trachea midline  Lungs:  Clear to auscultation  Heart: Regular rate and rhythm  Abdomen:  Soft, nontender,   Extremities:   peripheral edema.  Neurologic: Nonfocal, moving all four extremities  Skin: No lesions  Access: Left AVF    Basic Metabolic Panel: Recent Labs  Lab 03/21/20 0315  NA 139  K 5.4*  CL 99  CO2 23  GLUCOSE 94  BUN 71*  CREATININE 17.04*  CALCIUM 8.4*  MG 2.3  PHOS 8.0*    Liver Function Tests: Recent Labs  Lab 03/21/20 0315  AST 12*  ALT 21  ALKPHOS 103  BILITOT 1.7*  PROT 7.3  ALBUMIN 3.5   No results for input(s): LIPASE, AMYLASE in the last 168 hours. No results for input(s): AMMONIA in the last 168  hours.  CBC: Recent Labs  Lab 03/21/20 0315  WBC 8.7  NEUTROABS 6.6  HGB 6.9*  HCT 21.9*  MCV 100.9*  PLT 140*    Cardiac Enzymes: No results for input(s): CKTOTAL, CKMB, CKMBINDEX, TROPONINI in the last 168 hours.  BNP: Invalid input(s): POCBNP  CBG: Recent Labs  Lab 03/21/20 0654  GLUCAP 37*    Microbiology: Results for orders placed or performed during the hospital encounter of 03/21/20  SARS Coronavirus 2 by RT PCR (hospital order, performed in [facility] hospital lab) Nasopharyngeal Nasopharyngeal Swab     Status: None   Collection Time: 03/21/20  3:10 AM   Specimen: Nasopharyngeal Swab  Result Value Ref Range Status   SARS Coronavirus 2 NEGATIVE NEGATIVE Final    Comment: (NOTE) SARS-CoV-2 target nucleic acids are NOT DETECTED.  The SARS-CoV-2 RNA is generally detectable in upper and lower respiratory specimens during the acute phase of infection. The lowest concentration of SARS-CoV-2 viral copies this assay can detect is 250 copies / mL. A negative result does not preclude SARS-CoV-2 infection and should not be used as the sole basis for treatment or other patient management decisions.  A negative result may occur with improper specimen collection / handling, submission of specimen other than nasopharyngeal swab, presence of viral mutation(s) within the areas targeted by this assay, and inadequate number of viral copies (<250 copies / mL). A negative result must be combined with clinical observations, patient history, and epidemiological information.  Fact Sheet for Patients:   StrictlyIdeas.no  Fact Sheet for Healthcare Providers: BankingDealers.co.za  This test is not yet approved or  cleared by the [place] FDA and has been authorized for detection and/or diagnosis of SARS-CoV-2 by FDA under an Emergency Use Authorization (EUA).  This EUA will remain in effect (meaning this test can be used) for  the duration of the COVID-19 declaration under Section 564(b)(1) of the Act, 21 U.S.C. section 360bbb-3(b)(1), unless the authorization is terminated or revoked sooner.  Performed at [facility], [address] [place], [place] [address]     Coagulation Studies: Recent Labs    03/21/20 0315  LABPROT 16.7*  INR 1.4*    Urinalysis: No results for input(s): COLORURINE, LABSPEC, PHURINE, GLUCOSEU, HGBUR, BILIRUBINUR, KETONESUR, PROTEINUR, UROBILINOGEN, NITRITE, LEUKOCYTESUR in the last 72 hours.  Invalid input(s): APPERANCEUR    Imaging: DG Chest Port 1 View  Result Date: 03/21/2020 CLINICAL DATA:  Shortness of breath EXAM: PORTABLE CHEST 1 VIEW COMPARISON:  Nine days ago FINDINGS: Cardiomegaly and vascular congestion. No Kerley lines or effusion. No pneumothorax or focal consolidation. IMPRESSION: Cardiomegaly and vascular congestion. Electronically Signed   By: Monte Fantasia M.D.   On: 03/21/2020 04:10     Medications:   . sodium chloride    . sodium chloride Stopped (03/21/20 1311)   . calcium acetate  1,334 mg Oral TID WC  . gabapentin  300 mg Oral QHS  . haloperidol lactate  2 mg Intravenous Once  . heparin  5,000 Units Subcutaneous Q8H  . hydrALAZINE  50 mg Oral TID  . hydrocortisone   Topical TID  . lanthanum  1,000 mg Oral TID WC  . lisinopril  5 mg Oral Daily  . nitroGLYCERIN  1 inch Topical Q6H  . patiromer  8.4 g Oral Daily  . sevelamer carbonate  800 mg Oral TID WC  . sodium chloride flush  3 mL Intravenous Q12H   sodium chloride, acetaminophen, ALPRAZolam, camphor-menthol, diphenhydrAMINE, hydrOXYzine, ondansetron (ZOFRAN) IV, sodium chloride flush, triamcinolone cream  Assessment/ Plan:  [name] is a 54 y.o. black male with end stage renal disease on hemodialysis, alcohol abuse, erectile dysfunction, hypertension who is admitted to [facility] for Acute pulmonary edema ([place]) [J81.0] Respiratory distress [R06.03]  [place]  [facility]/MWF/[facility]  1. End Stage Renal Disease with hyperkalemia: hemodialysis treatment today emergently. Tolerated treatment well.  - patiromer  2. Hypertension: well controlled. Restarted lisinopril and hydralazine.   3. Anemia of chronic kidney disease: Hemoglobin 6.9. PRBC transfusion for today.   4. Secondary Hyperparathyroidism - lanthanum and sevelamer   LOS: 0 Bricelyn Freestone 6/30/20214:58 PM

## 2020-03-21 NOTE — H&P (Signed)
History and Physical    [name]:[id number] DOB: 1965/11/17 DOA: 03/21/2020  PCP: Jearld Fenton, NP   Patient coming from: home  I have personally briefly reviewed patient's old medical records in [name]  Chief Complaint: shortness of breath  HPI: Jordan [name] is a 54 y.o. male with medical history significant for KOAH [name] is a 55 y.o. 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  O2 sat was in the 80s.  History is limited due to clinical condition but he denies cough, fever or chills and denies chest pain.ED Course: On arrival blood pressure was 165/112 with heart rate 103, respiratory rate 22, O2 sat 80% as he was transitioned to BiPAP in the ER.  Chest x-ray showed pulmonary vascular congestion.  EKG showed sinus tachycardia with no acute ST-T wave changes.  Blood work significant for troponin of 322, BNP 3600, hemoglobin 6.9.  Potassium normal.   ABG on BiPAP was unremarkable.  Patient treated with Nitropaste.  The emergency room provider contacted nephrologist Dr. Juleen China who will dialyze patient in the next few hours.  Review of Systems: Limited due to clinical condition.    Past Medical History:  Diagnosis Date  . Anemia   . Aortic atherosclerosis ([place]) 11/12/2019  . CKD (chronic kidney disease)    Stage 5  Dialysis - M/W/F in [place], [place]  . Dyspnea    tx with inhaler when sick  . ED (erectile dysfunction)   . Emphysema of lung ([place]) 11/12/2019  . ETOH abuse   . History of blood transfusion   . Hypertension   . Wears dentures     Past Surgical History:  Procedure Laterality Date  . BASCILIC VEIN  TRANSPOSITION Left 08/07/2016   Procedure: LEFT BASILIC VEIN TRANSPOSITION;  Surgeon: Angelia Mould, MD;  Location: [place];  Service: Vascular;  Laterality: Left;  . CLOSED REDUCTION NASAL FRACTURE N/A 08/11/2019   Procedure: CLOSED REDUCTION NASAL FRACTURE WITH STABILIZATION;  Surgeon: Irene Limbo, MD;  Location: [place];  Service: Plastics;  Laterality: N/A;  . COLONOSCOPY N/A 08/12/2016   Procedure: COLONOSCOPY;  Surgeon: Otis Brace, MD;  Location: [place];  Service: Gastroenterology;  Laterality: N/A;  . ESOPHAGOGASTRODUODENOSCOPY N/A 08/12/2016   Procedure: ESOPHAGOGASTRODUODENOSCOPY (EGD);  Surgeon: Otis Brace, MD;  Location: [place];  Service: Gastroenterology;  Laterality: N/A;  . INSERTION OF DIALYSIS CATHETER N/A 08/07/2016   Procedure: INSERTION OF TUNNELED DIALYSIS CATHETER;  Surgeon: Angelia Mould, MD;  Location: [place];  Service: Vascular;  Laterality: N/A;  . LIGATION OF ARTERIOVENOUS  FISTULA Left 09/12/2016   Procedure: BANDING OF LEFT  ARTERIOVENOUS  FISTULA;  Surgeon: Angelia Mould, MD;  Location: [place];  Service: Vascular;  Laterality: Left;  . LOWER EXTREMITY INTERVENTION Right 12/02/2018   Procedure: LOWER EXTREMITY INTERVENTION;  Surgeon: Algernon Huxley, MD;  Location: [place] [facility];  Service: Cardiovascular;  Laterality: Right;     reports that he has been smoking cigarettes. He has a 20.00 pack-year smoking history. He has never used smokeless tobacco. He reports current alcohol use of about 21.0 standard drinks of alcohol per week. He  reports that he does not use drugs.  No Known Allergies  Family History  Problem Relation Age of Onset  . Diabetes Mother   . Kidney failure Mother   . Healthy Father   . Kidney failure Brother   . Healthy Sister   . Kidney disease Daughter   . Post-traumatic stress disorder Neg Hx   . Bladder Cancer Neg Hx   . Kidney cancer Neg Hx       Prior to Admission medications    Medication Sig Start Date End Date Taking? Authorizing Provider  FOSRENOL 1000 MG PACK Take 2 packets by mouth See admin instructions. MIX 2 PACKETS WITH SMALL AMOUNT OF APPLESAUCE OR SIMILAR FOOD. EAT IMMEDIATELY 3 TIMES/DAY WITH MEALS AND 1 PACKET WITH SNACKS 02/27/20  Yes [provider]  gabapentin (NEURONTIN) 300 MG capsule Take 300 mg by mouth at bedtime.   Yes [provider]  hydrALAZINE (APRESOLINE) 50 MG tablet Take 50 mg by mouth 3 (three) times daily.   Yes [provider]  hydrOXYzine (ATARAX/VISTARIL) 50 MG tablet Take 50 mg by mouth 3 (three) times daily as needed for anxiety or itching.  08/09/19  Yes [provider]  patiromer (VELTASSA) 8.4 g packet Take 1 packet (8.4 g total) by mouth daily. 03/14/20  Yes Lorella Nimrod, MD  quinapril (ACCUPRIL) 10 MG tablet Take 10 mg by mouth every evening.   Yes [provider]  sevelamer carbonate (RENVELA) 800 MG tablet Take 800 mg by mouth 3 (three) times daily with meals.   Yes [provider]  calcium acetate (PHOSLO) 667 MG capsule Take 2 capsules (1,334 mg total) by mouth 3 (three) times daily with meals. Patient taking differently: Take 2,001 mg by mouth 3 (three) times daily with meals.  02/12/19   Fritzi Mandes, MD  camphor-menthol [facility]) lotion Apply 1 application topically as needed for itching. 11/23/19   Charlann Lange, PA-C  triamcinolone cream (KENALOG) 0.1 % Apply 1 application topically 2 (two) times daily. Patient taking differently: Apply 1 application topically 2 (two) times daily as needed (itching).  11/23/19   Charlann Lange, PA-C    Physical Exam: Vitals:   03/21/20 0330 03/21/20 0400 03/21/20 0430 03/21/20 0500  BP: (!) 159/100 (!) 162/113 (!) 166/107 (!) 147/98  Pulse: 99 98 99 88  Resp: (!) 29 (!) 29 (!) 31 20  Temp:      TempSrc:      SpO2: 100% 100% 99% 100%  Weight:      Height:         Vitals:   03/21/20 0330 03/21/20 0400 03/21/20 0430 03/21/20 0500   BP: (!) 159/100 (!) 162/113 (!) 166/107 (!) 147/98  Pulse: 99 98 99 88  Resp: (!) 29 (!) 29 (!) 31 20  Temp:      TempSrc:      SpO2: 100% 100% 99% 100%  Weight:      Height:          Constitutional: Awake, alert.  Tachypneic and speaking in shortened sentences.  BiPAP on but increased work of breathing HEENT:      Head: Normocephalic and atraumatic.         Eyes: PERLA, EOMI, Conjunctivae are normal. Sclera is non-icteric.       Mouth/Throat: Mucous membranes are moist.       Neck: Supple with no signs of meningismus. Cardiovascular:  Sinus tachycardia. No murmurs, gallops, or rubs. 2+ symmetrical distal pulses are present . No JVD. No LE  edema Respiratory:Tachypneic and speaking in shortened sentences.  BiPAP on but increased work of breathing .  Decreased breath sounds bilaterally with bibasilar rales.  Gastrointestinal: Soft, non tender, mild distention with positive bowel sounds. No rebound or guarding. Genitourinary: No CVA tenderness. Musculoskeletal: Nontender with normal range of motion in all extremities. No cyanosis, or erythema of extremities. Neurologic: Normal speech and language. Face is symmetric. Moving all extremities. No gross focal neurologic deficits . Skin: Skin is warm, dry.  No rash or ulcers Psychiatric: Mood and affect are normal Speech and behavior are normal   Labs on Admission: I have personally reviewed following labs and imaging studies  CBC: Recent Labs  Lab 03/21/20 0315  WBC 8.7  NEUTROABS 6.6  HGB 6.9*  HCT 21.9*  MCV 100.9*  PLT 875*   Basic Metabolic Panel: Recent Labs  Lab 03/21/20 0315  NA 139  K 5.4*  CL 99  CO2 23  GLUCOSE 94  BUN 71*  CREATININE 17.04*  CALCIUM 8.4*  MG 2.3  PHOS 8.0*   GFR: Estimated Creatinine Clearance: 4.7 mL/min (A) (by C-G formula based on SCr of 17.04 mg/dL (H)). Liver Function Tests: Recent Labs  Lab 03/21/20 0315  AST 12*  ALT 21  ALKPHOS 103  BILITOT 1.7*  PROT 7.3  ALBUMIN 3.5    No results for input(s): LIPASE, AMYLASE in the last 168 hours. No results for input(s): AMMONIA in the last 168 hours. Coagulation Profile: Recent Labs  Lab 03/21/20 0315  INR 1.4*   Cardiac Enzymes: No results for input(s): CKTOTAL, CKMB, CKMBINDEX, TROPONINI in the last 168 hours. BNP (last 3 results) No results for input(s): PROBNP in the last 8760 hours. HbA1C: No results for input(s): HGBA1C in the last 72 hours. CBG: No results for input(s): GLUCAP in the last 168 hours. Lipid Profile: No results for input(s): CHOL, HDL, LDLCALC, TRIG, CHOLHDL, LDLDIRECT in the last 72 hours. Thyroid Function Tests: No results for input(s): TSH, T4TOTAL, FREET4, T3FREE, THYROIDAB in the last 72 hours. Anemia Panel: No results for input(s): VITAMINB12, FOLATE, FERRITIN, TIBC, IRON, RETICCTPCT in the last 72 hours. Urine analysis:    Component Value Date/Time   COLORURINE YELLOW 01/24/2020 0904   APPEARANCEUR CLEAR 01/24/2020 0904   LABSPEC 1.012 01/24/2020 0904   PHURINE 9.0 (H) 01/24/2020 0904   GLUCOSEU 150 (A) 01/24/2020 0904   HGBUR SMALL (A) 01/24/2020 0904   BILIRUBINUR NEGATIVE 01/24/2020 0904   [place] 01/24/2020 0904   PROTEINUR 100 (A) 01/24/2020 0904   NITRITE NEGATIVE 01/24/2020 0904   LEUKOCYTESUR MODERATE (A) 01/24/2020 0904    Radiological Exams on Admission: DG Chest Port 1 View  Result Date: 03/21/2020 CLINICAL DATA:  Shortness of breath EXAM: PORTABLE CHEST 1 VIEW COMPARISON:  Nine days ago FINDINGS: Cardiomegaly and vascular congestion. No Kerley lines or effusion. No pneumothorax or focal consolidation. IMPRESSION: Cardiomegaly and vascular congestion. Electronically Signed   By: Monte Fantasia M.D.   On: 03/21/2020 04:10    EKG: Independently reviewed. Interpretation :   Assessment/Plan Principal Problem:   Acute pulmonary edema (HCC) Active Problems:   Acute on chronic combined systolic and diastolic CHF (congestive heart failure) (HCC)    ESRD on dialysis (HCC)   Non-compliance with renal dialysis ([place])   Essential hypertension   Elevated troponin   Anemia in ESRD (end-stage renal disease) ([place])   1.  Acute pulmonary edema (HCC) secondary to fluid overload from missed dialysis   2. Acute respiratory failure with hypoxia ([place])  3. Acute on chronic combined systolic and diastolic CHF (congestive heart failure) ([place]) -Patient presenting with acute respiratory failure with O2 sats 80% on room air on arrival of EMS with work-up consistent with acute heart  -Last EF 45 to 50% with grade 3 diastolic dysfunction on 1/0/3159 -Last dialysis session on 03/16/2020 -Continue Nitropaste -Expect improvement with dialysis -Continue BiPAP and wean as tolerated    ESRD on dialysis [facility]), with missed dialysis sessions -Nephrology consult for dialysis.  Dr. Juleen China will dialyze at 8:30 AM  Elevated troponin -Likely related to demand ischemia from acute respiratory distress.  Patient denies chest pain and EKG with no acute ST-T wave changes   Anemia in ESRD (end-stage renal disease) (HCC) -Hemoglobin of 6.9 down from baseline of 8 -Type and screen for possible transfusion during dialysis    Essential hypertension -Continue home meds   DVT prophylaxis: Heparin Code Status: full code  Family Communication:  none  Disposition Plan: Back to previous home environment Consults called: Nephrology Status:obs    Athena Masse MD [facility]     03/21/2020, 5:03 AM

## 2020-03-21 NOTE — ED Notes (Signed)
Pt states he has to use the toilet for a BM now as an emergency, pt provided with bedside commode for BM. Currently sitting on at this time. Pt easily transferred from bed to commode on own. Will take pt to floor after BM is finished. Pt has pull all wires and monitoring devices off of self due to stating he needs to have BM

## 2020-03-21 NOTE — ED Triage Notes (Signed)
Pt comes from home via [facility] due to SOB. Pt reports going to sleep not feeling well tonight, waking up and being SOB. EMS arrived and pt had RR of 30s, pt complained of abdominal pain and distension. Pt is a dialysis pt and is due for tx this AM and has not missed treatments. EMS administered 2 doses of nitroglycerin and placed pt on CPAP. Prior to pt receiving nitro pt experienced a syncopal episode moving from chair to stretcher. Pt did not fall.   On arrival Pt is still SOB and RR 32

## 2020-03-21 NOTE — ED Notes (Signed)
X RAY at bedside 

## 2020-03-21 NOTE — Progress Notes (Signed)
Treatment completed tolerated well   03/21/20 1200  Hand-Off documentation  Handoff Given Given to shift RN/LPN  Report given to (Full Name) Wilhelmenia Blase RN  Handoff Received Received from shift RN/LPN  Report received from (Full Name) Sherren Mocha RN  Vital Signs  Temp 98.8 F (37.1 C)  Temp Source Oral  Pulse Rate 99  Pulse Rate Source Monitor  Resp 18  BP (!) 145/83  BP Location Right Arm  BP Method Automatic  Patient Position (if appropriate) Lying  Oxygen Therapy  SpO2 99 %  O2 Device Nasal Cannula  O2 Flow Rate (L/min) 2 L/min  Pain Assessment  Pain Scale 0-10  Pain Score 0  Pain Type Acute pain  Patients Stated Pain Goal 0  Pre Treatment Patient Checks  Vascular access used during treatment Fistula  Hepatitis B Surface Antigen Results Negative  Date Hepatitis B Surface Antigen Drawn 02/29/20  Hepatitis B Surface Antibody  (>1000)  Date Hepatitis B Surface Antibody Drawn 02/29/20  During Hemodialysis Assessment  Blood Flow Rate (mL/min) 200 mL/min  Arterial Pressure (mmHg) -30 mmHg  Venous Pressure (mmHg) 10 mmHg  Transmembrane Pressure (mmHg) 30 mmHg  Ultrafiltration Rate (mL/min) 300 mL/min  Dialysate Flow Rate (mL/min) 400 ml/min  Conductivity: Machine  13.8  HD Safety Checks Performed Yes  KECN 78.1 KECN  Dialysis Fluid Bolus Normal Saline  Bolus Amount (mL) 250 mL  Intra-Hemodialysis Comments Tx completed  Post-Hemodialysis Assessment  Rinseback Volume (mL) 250 mL  KECN 78.1 V  Dialyzer Clearance Clear  Duration of HD Treatment -hour(s) 3.5 hour(s)  Hemodialysis Intake (mL) 500 mL  UF Total -Machine (mL) 4000 mL  Net UF (mL) 3500 mL  Tolerated HD Treatment Yes  Post-Hemodialysis Comments treatment complete  AVG/AVF Arterial Site Held (minutes) 10 minutes  AVG/AVF Venous Site Held (minutes) 10 minutes  Education / Care Plan  Dialysis Education Provided Yes  Documented Education in Care Plan Yes  Fistula / Graft Left Upper arm Arteriovenous  fistula  No Placement Date or Time found.   Placed prior to admission: Yes  Orientation: Left  Access Location: Upper arm  Access Type: Arteriovenous fistula  Site Condition No complications  Fistula / Graft Assessment Present;Thrill;Bruit  Status Accessed  Needle Size 15g  Drainage Description None

## 2020-03-21 NOTE — ED Provider Notes (Signed)
[facility] Emergency Department Provider Note  ____________________________________________   First MD Initiated Contact with Patient 03/21/20 0310     (approximate)  I have reviewed the triage vital signs and the nursing notes.  History  Chief Complaint Shortness of Breath    HPI Jordan [name] is a 54 y.o. male with history of ESRD on HD, HF (EF 40 to 58% + diastolic dysfunction) who presents emergency department for shortness of breath and respiratory distress.  Patient dialyzes Monday, Wednesday, Friday.  Last dialyzed on Friday.  States he missed Monday of this week because of a recent death in the family.  Was feeling somewhat unwell when he went to bed, and then woke up in the middle of the night with acute shortness of breath and tachypnea. Symptoms have been constant since onset and worsening.  On EMS arrival he was tachypneic into the 30s.  Oxygen 93% on RA.  Placed on CPAP for his work of breathing w/ some improvement.  Did have a brief ~30 second syncopal episode during his transfer at home onto EMS stretcher, has since returned to baseline.  Given two nitro by EMS.  He denies any chest pain.  Does feel volume overloaded in his abdomen as well.  No fevers, cough.  Has not been vaccinated against COVID.  History somewhat limited due to his respiratory distress.    Past Medical Hx Past Medical History:  Diagnosis Date  . Anemia   . Aortic atherosclerosis ([place]) 11/12/2019  . CKD (chronic kidney disease)    Stage 5  Dialysis - M/W/F in [place], [place]  . Dyspnea    tx with inhaler when sick  . ED (erectile dysfunction)   . Emphysema of lung ([place]) 11/12/2019  . ETOH abuse   . History of blood transfusion   . Hypertension   . Wears dentures     Problem List Patient Active Problem List   Diagnosis Date Noted  . Acute CHF (congestive heart failure) ([place]) 03/12/2020  . 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  Acute on chronic combined systolic and diastolic CHF (congestive heart failure) ([place]) 02/03/2020  . Pulmonary edema 01/31/2020  . Syncopal episodes 01/24/2020  . Macrocytic anemia 01/24/2020  . Hyperkalemia 01/24/2020  . Fluid overload 11/12/2019  . Acute respiratory failure with hypoxia ([place]) 11/12/2019  . Aortic atherosclerosis ([place]) 11/12/2019  . Emphysema of lung ([place]) 11/12/2019  . Acute pulmonary edema ([place]) 10/31/2019  . Acute on chronic congestive heart failure ([place]) 10/22/2019  . Acute respiratory distress 10/06/2019  . Volume overload 10/05/2019  . Hypokalemia 10/05/2019  . ESRD (end stage renal disease) ([place]) 10/01/2019  . Leg mass, right   . Hyperbilirubinemia   . Symptomatic anemia 09/19/2019  . Pruritus 09/19/2019  . Hemoptysis 02/11/2019  . Hypercalcemia 01/19/2019  . Anemia in ESRD (end-stage renal disease) ([place]) 05/17/2018  . Hepatitis B 03/19/2018  . ESRD on dialysis ([place]) 05/26/2017  . Essential hypertension 05/26/2017  . Urinary tract infection, site not specified 10/17/2016  . Coagulation defect, unspecified ([place]) 08/13/2016  . Diarrhea, unspecified 08/13/2016  . Encounter for immunization 08/13/2016  . Headache, unspecified 08/13/2016  . Hypertensive chronic kidney disease with stage 5 chronic kidney disease or end stage renal disease ([place]) 08/13/2016  . Iron deficiency anemia, unspecified 08/13/2016  . Pain, unspecified 08/13/2016  . Moderate protein-calorie malnutrition ([place]) 08/13/2016  . Secondary hyperparathyroidism of renal origin ([place]) 08/13/2016    Past Surgical Hx Past Surgical  History:  Procedure Laterality Date  . BASCILIC VEIN TRANSPOSITION Left 08/07/2016   Procedure: LEFT BASILIC VEIN TRANSPOSITION;  Surgeon: Angelia Mould, MD;  Location: [facility];  Service: Vascular;  Laterality: Left;  . CLOSED REDUCTION NASAL FRACTURE N/A 08/11/2019   Procedure: CLOSED REDUCTION NASAL FRACTURE WITH STABILIZATION;  Surgeon: Irene Limbo, MD;   Location: [facility];  Service: Plastics;  Laterality: N/A;  . COLONOSCOPY N/A 08/12/2016   Procedure: COLONOSCOPY;  Surgeon: Otis Brace, MD;  Location: [facility];  Service: Gastroenterology;  Laterality: N/A;  . ESOPHAGOGASTRODUODENOSCOPY N/A 08/12/2016   Procedure: ESOPHAGOGASTRODUODENOSCOPY (EGD);  Surgeon: Otis Brace, MD;  Location: [facility];  Service: Gastroenterology;  Laterality: N/A;  . INSERTION OF DIALYSIS CATHETER N/A 08/07/2016   Procedure: INSERTION OF TUNNELED DIALYSIS CATHETER;  Surgeon: Angelia Mould, MD;  Location: [facility];  Service: Vascular;  Laterality: N/A;  . LIGATION OF ARTERIOVENOUS  FISTULA Left 09/12/2016   Procedure: BANDING OF LEFT  ARTERIOVENOUS  FISTULA;  Surgeon: Angelia Mould, MD;  Location: [facility];  Service: Vascular;  Laterality: Left;  . LOWER EXTREMITY INTERVENTION Right 12/02/2018   Procedure: LOWER EXTREMITY INTERVENTION;  Surgeon: Algernon Huxley, MD;  Location: [facility];  Service: Cardiovascular;  Laterality: Right;    Medications Prior to Admission medications   Medication Sig Start Date End Date Taking? Authorizing Provider  albuterol (PROVENTIL HFA;VENTOLIN HFA) 108 (90 Base) MCG/ACT inhaler Inhale 2 puffs into the lungs every 4 (four) hours as needed for wheezing or shortness of breath. 10/03/18   Orpah Greek, MD  calcitRIOL (ROCALTROL) 0.5 MCG capsule Take 1 capsule (0.5 mcg total) by mouth every Monday, Wednesday, and Friday with hemodialysis. Patient not taking: Reported on 03/12/2020 10/24/19   Flora Lipps, MD  calcium acetate (PHOSLO) 667 MG capsule Take 2 capsules (1,334 mg total) by mouth 3 (three) times daily with meals. Patient taking differently: Take 2,001 mg by mouth 3 (three) times daily with meals.  02/12/19   Fritzi Mandes, MD  camphor-menthol [facility]) lotion Apply 1 application topically as needed for itching. 11/23/19   Upstill, Shari, PA-C  FOSRENOL 1000 MG PACK Take 2 packets by mouth See  admin instructions. MIX 2 PACKETS WITH SMALL AMOUNT OF APPLESAUCE OR SIMILAR FOOD. EAT IMMEDIATELY 3 TIMES/DAY WITH MEALS AND 1 PACKET WITH SNACKS 02/27/20   [provider]  hydrOXYzine (ATARAX/VISTARIL) 50 MG tablet Take 50 mg by mouth 3 (three) times daily as needed for anxiety or itching.  08/09/19   [provider]  patiromer (VELTASSA) 8.4 g packet Take 1 packet (8.4 g total) by mouth daily. 03/14/20   Lorella Nimrod, MD  triamcinolone cream (KENALOG) 0.1 % Apply 1 application topically 2 (two) times daily. Patient taking differently: Apply 1 application topically 2 (two) times daily as needed (itching).  11/23/19   Charlann Lange, PA-C    Allergies Patient has no known allergies.  Family Hx Family History  Problem Relation Age of Onset  . Diabetes Mother   . Kidney failure Mother   . Healthy Father   . Kidney failure Brother   . Healthy Sister   . Kidney disease Daughter   . Post-traumatic stress disorder Neg Hx   . Bladder Cancer Neg Hx   . Kidney cancer Neg Hx     Social Hx Social History   Tobacco Use  . Smoking status: Current Every Day Smoker    Packs/day: 0.50    Years: 40.00    Pack years: 20.00  Types: Cigarettes  . Smokeless tobacco: Never Used  Vaping Use  . Vaping Use: Never used  Substance Use Topics  . Alcohol use: Yes    Alcohol/week: 21.0 standard drinks    Types: 21 Cans of beer per week    Comment: none since 08/04/16  . Drug use: No     Review of Systems  Constitutional: Negative for fever. Negative for chills. Eyes: Negative for visual changes. ENT: Negative for sore throat. Cardiovascular: Negative for chest pain. Respiratory: + for shortness of breath. Gastrointestinal: Negative for nausea. Negative for vomiting.  Genitourinary: Negative for dysuria. Musculoskeletal: Negative for leg swelling. Skin: Negative for rash. Neurological: Negative for headaches.   Physical Exam  Vital Signs: ED Triage Vitals  Enc Vitals  Group     BP 03/21/20 0311 (!) 165/112     Pulse Rate 03/21/20 0311 (!) 103     Resp 03/21/20 0311 (!) 32     Temp --      Temp src --      SpO2 03/21/20 0311 (!) 80 %     Weight 03/21/20 0312 169 lb 5 oz (76.8 kg)     Height 03/21/20 0312 5\' 4"  (1.626 m)     Head Circumference --      Peak Flow --      Pain Score 03/21/20 0311 8     Pain Loc --      Pain Edu? --      Excl. in [place]? --     Constitutional: Alert and oriented. Arrives on CPAP. Speaking in shortened sentences.  Head: Normocephalic. Atraumatic. Eyes: Conjunctivae clear. Sclera anicteric. Pupils equal and symmetric. Nose: No masses or lesions. No congestion or rhinorrhea. Mouth/Throat: Wearing mask.  Neck: No stridor. Trachea midline.  Cardiovascular: Tachycardic, regular rhythm. Extremities well perfused. Respiratory: Speaking in shortened sentences.  Arrives on CPAP with EMS, transitioned to BiPAP on arrival.  Tachypneic, with decreased breath sounds at the bases and bibasilar rales.  Oxygen 100% on BiPAP at 12/6, 50%. Gastrointestinal: Soft. Slightly distended, presumably from fluid.  Genitourinary: Deferred. Musculoskeletal: No lower extremity edema. No deformities. Neurologic:  Normal speech and language. No gross focal or lateralizing neurologic deficits are appreciated.  Skin: Scattered scabs and excoriations to the extremities.  Psychiatric: Mood and affect are appropriate for situation.  EKG  Personally reviewed and interpreted by myself.   Date: 03/21/20 Time: 0307 Rate: 104 Rhythm: sinus Axis: normal Intervals: WNL Significant artifact, but no acute ischemic changes noted No STEMI    Radiology  Personally reviewed available imaging myself.   CXR - IMPRESSION:  Cardiomegaly and vascular congestion.    Procedures  Procedure(s) performed (including critical care):  .Critical Care Performed by: Lilia Pro., MD Authorized by: Lilia Pro., MD   Critical care provider statement:     Critical care time (minutes):  40   Critical care was time spent personally by me on the following activities:  Discussions with consultants, evaluation of patient's response to treatment, examination of patient, ordering and performing treatments and interventions, ordering and review of laboratory studies, ordering and review of radiographic studies, pulse oximetry, re-evaluation of patient's condition, obtaining history from patient or surrogate and review of old charts     Initial Impression / Assessment and Plan / MDM / ED Course  54 y.o. male who presents to the ED for respiratory distress, tachypnea, SOB, likely 2nd volume overload in the setting of ESRD w/ missed dialysis as well as hx of HF  Ddx: volume overload in the setting of ESRD with missed dialysis, as well as history of HF, combined systolic and diastolic.  Also consider ACS, pulmonary infection, COVID-19, symptomatic anemia.  Will continue BiPAP, obtain labs, imaging.  Anticipate he will need urgent dialysis.  Will plan for labs, imaging, supplemental oxygen  Normal pH and pO2 on ABG, will decrease FiO2, but continue BiPAP for his work of breathing.  Remainder of labs reveal elevated BNP 3600.  CXR with vascular congestion and cardiomegaly.  Troponin 322, suspect secondary to demand, but this is slightly higher than his priors, will trend. EKG w/ artifact due to being on BiPAP, but no STEMI and no obvious acute ischemic changes.  Hemoglobin 6.9, this appears somewhat within range of his priors from 5.8-8.3 previously.  COVID negative.  Discussed with nephrology, Dr. Juleen China, who will plan to dialyze first thing in the AM.  Will admit.  _______________________________   As part of my medical decision making I have reviewed available labs, radiology tests, reviewed old records/performed chart review, obtained additional history from EMS, and discussed with consultants (nephrology, Dr. Juleen China).     Final Clinical  Impression(s) / ED Diagnosis  Respiratory distress    Note:  This document was prepared using Dragon voice recognition software and may include unintentional dictation errors.   Lilia Pro., MD 03/21/20 [phone]

## 2020-03-21 NOTE — Progress Notes (Signed)
Inpatient Diabetes Program Recommendations  AACE/ADA: New Consensus Statement on Inpatient Glycemic Control (2015)  Target Ranges:  Prepandial:   less than 140 mg/dL      Peak postprandial:   less than 180 mg/dL (1-2 hours)      Critically ill patients:  140 - 180 mg/dL   Lab Results  Component Value Date   GLUCAP 66 (L) 03/21/2020   HGBA1C 6.3 06/23/2019    Review of Glycemic Control Results for YUNIOR, JAIN (MRN [id number]) as of 03/21/2020 10:43  Ref. Range 03/21/2020 06:54  Glucose-Capillary Latest Ref Range: 70 - 99 mg/dL 66 (L)   Diabetes history: PreDM? Outpatient Diabetes medications: none Current orders for Inpatient glycemic control: none  Inpatient Diabetes Program Recommendations:    Noted mild hypoglycemia this AM prior to HD. Consider adding CBGs TID & HS.  Also, last A1C from 2020, consider adding A1C?   Thanks, Bronson Curb, MSN, RNC-OB Diabetes Coordinator [phone] (8a-5p)

## 2020-03-21 NOTE — ED Notes (Signed)
Pt a difficult stick, lab contacted requesting for assistance with troponin and type and screen collection. States the will be here for collection at earliest possible time.

## 2020-03-21 NOTE — ED Notes (Signed)
Pt provided warm blanket and tv remote at this time. Denies any other needs currently,call light in reach

## 2020-03-21 NOTE — ED Notes (Addendum)
Pt transitioned to [place] by this nurse with order from Dr. Damita Dunnings due to pt request. Pt placed on 4LPM [place]. Pt expresses no SOB, O2 sats remain 98-100% on 4LPM. RR even and unlabored at this time. Pt continues to move in bed and moan in discomfort due to itching in legs despite benadryl doses. Pt states this itching is ongoing for last "few weeks" and pt states "I meant to get this looked at but forgot to." Pt has spots on inside of legs that he has scratched top layer of skin from.

## 2020-03-21 NOTE — ED Notes (Signed)
Pt calls nurse in room complaining of itching in the legs and stomach. Pt appears uncomfortable, MD notified, orders to follow

## 2020-03-21 NOTE — Plan of Care (Signed)
Recently admitted for fluid volume overload.

## 2020-03-21 NOTE — ED Notes (Signed)
Date and time results received: 03/21/20 0400 (use smartphrase ".now" to insert current time)  Test: troponin Critical Value: 322  Name of Provider Notified: [facility]

## 2020-03-22 DIAGNOSIS — I10 Essential (primary) hypertension: Secondary | ICD-10-CM | POA: Diagnosis not present

## 2020-03-22 DIAGNOSIS — E875 Hyperkalemia: Secondary | ICD-10-CM | POA: Diagnosis not present

## 2020-03-22 DIAGNOSIS — N2581 Secondary hyperparathyroidism of renal origin: Secondary | ICD-10-CM | POA: Diagnosis not present

## 2020-03-22 DIAGNOSIS — D631 Anemia in chronic kidney disease: Secondary | ICD-10-CM | POA: Diagnosis not present

## 2020-03-22 DIAGNOSIS — R778 Other specified abnormalities of plasma proteins: Secondary | ICD-10-CM | POA: Diagnosis not present

## 2020-03-22 DIAGNOSIS — J810 Acute pulmonary edema: Secondary | ICD-10-CM | POA: Diagnosis not present

## 2020-03-22 DIAGNOSIS — I5043 Acute on chronic combined systolic (congestive) and diastolic (congestive) heart failure: Secondary | ICD-10-CM | POA: Diagnosis not present

## 2020-03-22 DIAGNOSIS — N186 End stage renal disease: Secondary | ICD-10-CM | POA: Diagnosis not present

## 2020-03-22 LAB — BASIC METABOLIC PANEL
Anion gap: 14 (ref 5–15)
BUN: 58 mg/dL — ABNORMAL HIGH (ref 6–20)
CO2: 26 mmol/L (ref 22–32)
Calcium: 8.8 mg/dL — ABNORMAL LOW (ref 8.9–10.3)
Chloride: 99 mmol/L (ref 98–111)
Creatinine, Ser: 9.88 mg/dL — ABNORMAL HIGH (ref 0.61–1.24)
GFR calc Af Amer: 6 mL/min — ABNORMAL LOW (ref >60)
GFR calc non Af Amer: 5 mL/min — ABNORMAL LOW (ref >60)
Glucose, Bld: 119 mg/dL — ABNORMAL HIGH (ref 70–99)
Potassium: 4.9 mmol/L (ref 3.5–5.1)
Sodium: 139 mmol/L (ref 135–145)

## 2020-03-22 LAB — MAGNESIUM: Magnesium: 2 mg/dL (ref 1.7–2.4)

## 2020-03-22 LAB — CBC
HCT: 22.2 % — ABNORMAL LOW (ref 39.0–52.0)
Hemoglobin: 7.2 g/dL — ABNORMAL LOW (ref 13.0–17.0)
MCH: 32.4 pg (ref 26.0–34.0)
MCHC: 32.4 g/dL (ref 30.0–36.0)
MCV: 100 fL (ref 80.0–100.0)
Platelets: 141 10*3/uL — ABNORMAL LOW (ref 150–400)
RBC: 2.22 MIL/uL — ABNORMAL LOW (ref 4.22–5.81)
RDW: 17.2 % — ABNORMAL HIGH (ref 11.5–15.5)
WBC: 7.5 10*3/uL (ref 4.0–10.5)
nRBC: 0 % (ref 0.0–0.2)

## 2020-03-22 LAB — PHOSPHORUS: Phosphorus: 6 mg/dL — ABNORMAL HIGH (ref 2.5–4.6)

## 2020-03-22 LAB — MRSA PCR SCREENING: MRSA by PCR: NEGATIVE

## 2020-03-22 NOTE — Progress Notes (Signed)
Jordan [name] to be D/C'd Home per MD order.  Discussed prescriptions and follow up appointments with the patient. Prescriptions given to patient, medication list explained in detail. Pt verbalized understanding.  Allergies as of 03/22/2020   No Known Allergies     Medication List    TAKE these medications   calcium acetate 667 MG capsule Commonly known as: PHOSLO Take 2 capsules (1,334 mg total) by mouth 3 (three) times daily with meals. What changed: how much to take   Fosrenol 1000 MG Pack Generic drug: Lanthanum Carbonate Take 2 packets by mouth See admin instructions. MIX 2 PACKETS WITH SMALL AMOUNT OF APPLESAUCE OR SIMILAR FOOD. EAT IMMEDIATELY 3 TIMES/DAY WITH MEALS AND 1 PACKET WITH SNACKS   gabapentin 300 MG capsule Commonly known as: NEURONTIN Take 300 mg by mouth at bedtime.   hydrALAZINE 50 MG tablet Commonly known as: APRESOLINE Take 50 mg by mouth 3 (three) times daily.   hydrOXYzine 50 MG tablet Commonly known as: ATARAX/VISTARIL Take 50 mg by mouth 3 (three) times daily as needed for anxiety or itching.   patiromer 8.4 g packet Commonly known as: VELTASSA Take 1 packet (8.4 g total) by mouth daily.   quinapril 10 MG tablet Commonly known as: ACCUPRIL Take 10 mg by mouth every evening.   Sarna lotion Generic drug: camphor-menthol Apply 1 application topically as needed for itching.   sevelamer carbonate 800 MG tablet Commonly known as: RENVELA Take 800 mg by mouth 3 (three) times daily with meals.   triamcinolone cream 0.1 % Commonly known as: KENALOG Apply 1 application topically 2 (two) times daily. What changed:   when to take this  reasons to take this            Durable Medical Equipment  (From admission, onward)         Start     Ordered   03/22/20 1319  For home use only DME oxygen  Once       Question Answer Comment  Length of Need 12 Months   Mode or (Route) Nasal cannula   Liters per Minute 2   Frequency Continuous  (stationary and portable oxygen unit needed)   Oxygen conserving device Yes   Oxygen delivery system Gas      03/22/20 1318          Vitals:   03/22/20 1102 03/22/20 1152  BP:  (!) 161/99  Pulse:  99  Resp:  16  Temp:  97.9 F (36.6 C)  SpO2: 93% 100%    Skin clean, dry and intact without evidence of skin break down, no evidence of skin tears noted. IV catheter discontinued intact. Site without signs and symptoms of complications. Dressing and pressure applied. Pt denies pain at this time. No complaints noted.  An After Visit Summary was printed and given to the patient. Patient escorted via [place], and D/C home via private auto.  Fuller Mandril, RN

## 2020-03-22 NOTE — Evaluation (Signed)
Physical Therapy Evaluation Patient Details Name: Jordan [name] MRN: [id number] DOB: 10-07-1965 Today's Date: 03/22/2020   History of Present Illness  Jordan [name] is a 54 y.o. male with medical history significant for Jordan [name] is a 54 y.o. 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  Clinical Impression  Pt admitted with above diagnosis. Pt currently with functional limitations due to the deficits listed below (see "PT Problem List"). Upon entry, pt seated edge of bed, awake (but severely somnolent) and agreeable to participate. The pt is alert and oriented x3, pleasant, conversational, and generally a fair historian, difficult to understand at times due to slurred speech. Pt is snoring while awake, which he acknowledges before Pryor Curia has a chance to inquire. Pt also falls asleep mid sentence 3 times in 5 minutes with loss of seated postural upright and awakening with startle response, eventually convinced to move back to supine for safety. Pt able to perform bed mobility and transfers unsupported with independence, marches in place with no problem. Pt AMB around unit an hour prior for O2 assessment, but additional AMB is deferred at this time due to excessive daytime sleepiness action. Functional mobility assessment demonstrates increased effort/time requirements, good tolerance, but no need for physical assistance. Per nursing, pt will qualify for O2 at home due to desaturation with AMB. Per patient, no baseline impairment in mobility or balance issues. RN made aware of author's concerns regarding narcoleptic-like presentation of patient. Pt will  benefit from skilled PT intervention to increase independence and safety with basic mobility in preparation for discharge to the venue listed below.       Follow Up Recommendations Home health PT    Equipment Recommendations  None recommended by PT    Recommendations for Other Services       Precautions / Restrictions Precautions Precautions: Fall Restrictions Weight Bearing Restrictions: No      Mobility  Bed Mobility Overal bed mobility: Independent                Transfers Overall transfer level: Independent Equipment used: None             General transfer comment: no LOB, able to march in place without issue.  Ambulation/Gait Ambulation/Gait assistance:  (AMB round unit with RN earlier. Deferred at this time due to lethargy.)              Stairs            Wheelchair Mobility    Modified Rankin (Stroke Patients Only)       Balance Overall balance assessment: Modified Independent                                           Pertinent Vitals/Pain Pain Assessment: No/denies pain    Home Living Family/patient expects to be discharged to:: Private residence Living Arrangements: Alone Available Help at Discharge: Family Type of Home: House Home Access: Stairs to enter     Home Layout: One level Home Equipment: None      Prior  Function                 Hand Dominance        Extremity/Trunk Assessment   Upper Extremity Assessment Upper Extremity Assessment: Overall WFL for tasks assessed    Lower Extremity Assessment Lower Extremity Assessment: Overall WFL for tasks assessed       Communication      Cognition Arousal/Alertness: Lethargic Behavior During Therapy: WFL for tasks assessed/performed Overall Cognitive Status: Within Functional Limits for tasks assessed                                        General Comments      Exercises     Assessment/Plan    PT  Assessment Patient needs continued PT services  PT Problem List Decreased range of motion;Decreased activity tolerance;Decreased mobility;Decreased cognition       PT Treatment Interventions Gait training;Stair training;Functional mobility training;Therapeutic activities;Therapeutic exercise;Patient/family education    PT Goals (Current goals can be found in the Care Plan section)  Acute Rehab PT Goals Patient Stated Goal: improve breathing, not feel so sleepy PT Goal Formulation: With patient Time For Goal Achievement: 04/05/20 Potential to Achieve Goals: Fair    Frequency Min 2X/week   Barriers to discharge Inaccessible home environment      Co-evaluation               AM-PAC PT "6 Clicks" Mobility  Outcome Measure Help needed turning from your back to your side while in a flat bed without using bedrails?: None Help needed moving from lying on your back to sitting on the side of a flat bed without using bedrails?: None Help needed moving to and from a bed to a chair (including a wheelchair)?: None Help needed standing up from a chair using your arms (e.g., wheelchair or bedside chair)?: None Help needed to walk in hospital room?: A Little Help needed climbing 3-5 steps with a railing? : A Little 6 Click Score: 22    End of Session Equipment Utilized During Treatment: Gait belt Activity Tolerance: Patient tolerated treatment well;Patient limited by lethargy Patient left: in bed;with call bell/phone within reach;with bed alarm set Nurse Communication: Mobility status PT Visit Diagnosis: Difficulty in walking, not elsewhere classified (R26.2);Other abnormalities of gait and mobility (R26.89);Other symptoms and signs involving the nervous system (R29.898)    Time: 6948-5462 PT Time Calculation (min) (ACUTE ONLY): 12 min   Charges:   PT Evaluation $PT Eval High Complexity: 1 High          2:01 PM, 03/22/20 Etta Grandchild, PT, DPT Physical Therapist - [facility]  [phone] ([place])    [place] C 03/22/2020, 1:56 PM

## 2020-03-22 NOTE — Progress Notes (Signed)
[facility]  ROUNDING NOTE   Subjective:   Hemodialysis treatment yesterday. UF of 3.5 liters.   Objective:  Vital signs in last 24 hours:  Temp:  [97.6 F (36.4 C)-98.4 F (36.9 C)] 97.9 F (36.6 C) (07/01 1152) Pulse Rate:  [88-103] 99 (07/01 1152) Resp:  [16-24] 16 (07/01 1152) BP: (136-163)/(82-99) 161/99 (07/01 1152) SpO2:  [93 %-100 %] 100 % (07/01 1152) Weight:  [72 kg] 72 kg (07/01 0500)  Weight change: -4.8 kg Filed Weights   03/21/20 0312 03/22/20 0500  Weight: 76.8 kg 72 kg    Intake/Output: I/O last 3 completed shifts: In: 120 [P.O.:120] Out: 3550 [Urine:50; Other:3500]   Intake/Output this shift:  Total I/O In: 480 [P.O.:480] Out: -   Physical Exam: General: NAD,   Head: Normocephalic, atraumatic. Moist oral mucosal membranes  Eyes: Anicteric, PERRL  Neck: Supple, trachea midline  Lungs:  Clear to auscultation  Heart: Regular rate and rhythm  Abdomen:  Soft, nontender,   Extremities:   peripheral edema.  Neurologic: Nonfocal, moving all four extremities  Skin: No lesions  Access: Left AVF    Basic Metabolic Panel: Recent Labs  Lab 03/21/20 0315 03/22/20 0406  NA 139 139  K 5.4* 4.9  CL 99 99  CO2 23 26  GLUCOSE 94 119*  BUN 71* 58*  CREATININE 17.04* 9.88*  CALCIUM 8.4* 8.8*  MG 2.3 2.0  PHOS 8.0* 6.0*    Liver Function Tests: Recent Labs  Lab 03/21/20 0315  AST 12*  ALT 21  ALKPHOS 103  BILITOT 1.7*  PROT 7.3  ALBUMIN 3.5   No results for input(s): LIPASE, AMYLASE in the last 168 hours. No results for input(s): AMMONIA in the last 168 hours.  CBC: Recent Labs  Lab 03/21/20 0315 03/22/20 0406  WBC 8.7 7.5  NEUTROABS 6.6  --   HGB 6.9* 7.2*  HCT 21.9* 22.2*  MCV 100.9* 100.0  PLT 140* 141*    Cardiac Enzymes: No results for input(s): CKTOTAL, CKMB, CKMBINDEX, TROPONINI in the last 168 hours.  BNP: Invalid input(s): POCBNP  CBG: Recent Labs  Lab 03/21/20 0654  GLUCAP 22*     Microbiology: Results for orders placed or performed during the hospital encounter of 03/21/20  SARS Coronavirus 2 by RT PCR (hospital order, performed in [facility] hospital lab) Nasopharyngeal Nasopharyngeal Swab     Status: None   Collection Time: 03/21/20  3:10 AM   Specimen: Nasopharyngeal Swab  Result Value Ref Range Status   SARS Coronavirus 2 NEGATIVE NEGATIVE Final    Comment: (NOTE) SARS-CoV-2 target nucleic acids are NOT DETECTED.  The SARS-CoV-2 RNA is generally detectable in upper and lower respiratory specimens during the acute phase of infection. The lowest concentration of SARS-CoV-2 viral copies this assay can detect is 250 copies / mL. A negative result does not preclude SARS-CoV-2 infection and should not be used as the sole basis for treatment or other patient management decisions.  A negative result may occur with improper specimen collection / handling, submission of specimen other than nasopharyngeal swab, presence of viral mutation(s) within the areas targeted by this assay, and inadequate number of viral copies (<250 copies / mL). A negative result must be combined with clinical observations, patient history, and epidemiological information.  Fact Sheet for Patients:   StrictlyIdeas.no  Fact Sheet for Healthcare Providers: BankingDealers.co.za  This test is not yet approved or  cleared by the [place] FDA and has been authorized for detection and/or diagnosis of SARS-CoV-2  by FDA under an Emergency Use Authorization (EUA).  This EUA will remain in effect (meaning this test can be used) for the duration of the COVID-19 declaration under Section 564(b)(1) of the Act, 21 U.S.C. section 360bbb-3(b)(1), unless the authorization is terminated or revoked sooner.  Performed at [facility], [address] [place], [place] [address]   MRSA PCR Screening     Status: None   Collection Time:  03/21/20 11:48 PM   Specimen: Nasopharyngeal  Result Value Ref Range Status   MRSA by PCR NEGATIVE NEGATIVE Final    Comment:        The GeneXpert MRSA Assay (FDA approved for NASAL specimens only), is one component of a comprehensive MRSA colonization surveillance program. It is not intended to diagnose MRSA infection nor to guide or monitor treatment for MRSA infections. Performed at [facility], [address] [place], [place] [address]     Coagulation Studies: Recent Labs    03/21/20 0315  LABPROT 16.7*  INR 1.4*    Urinalysis: No results for input(s): COLORURINE, LABSPEC, PHURINE, GLUCOSEU, HGBUR, BILIRUBINUR, KETONESUR, PROTEINUR, UROBILINOGEN, NITRITE, LEUKOCYTESUR in the last 72 hours.  Invalid input(s): APPERANCEUR    Imaging: DG Chest Port 1 View  Result Date: 03/21/2020 CLINICAL DATA:  Shortness of breath EXAM: PORTABLE CHEST 1 VIEW COMPARISON:  Nine days ago FINDINGS: Cardiomegaly and vascular congestion. No Kerley lines or effusion. No pneumothorax or focal consolidation. IMPRESSION: Cardiomegaly and vascular congestion. Electronically Signed   By: Monte Fantasia M.D.   On: 03/21/2020 04:10     Medications:   . sodium chloride    . sodium chloride Stopped (03/21/20 1311)   . calcium acetate  1,334 mg Oral TID WC  . gabapentin  300 mg Oral QHS  . heparin  5,000 Units Subcutaneous Q8H  . hydrALAZINE  50 mg Oral TID  . hydrocortisone   Topical TID  . lanthanum  1,000 mg Oral TID WC  . lisinopril  5 mg Oral Daily  . nitroGLYCERIN  1 inch Topical Q6H  . patiromer  8.4 g Oral Daily  . sevelamer carbonate  800 mg Oral TID WC  . sodium chloride flush  3 mL Intravenous Q12H   sodium chloride, acetaminophen, ALPRAZolam, camphor-menthol, diphenhydrAMINE, hydrOXYzine, ondansetron (ZOFRAN) IV, triamcinolone cream  Assessment/ Plan:  [name] is a 54 y.o. black male with end stage renal disease on hemodialysis, alcohol abuse, erectile  dysfunction, hypertension who is admitted to [facility] for Acute pulmonary edema ([place]) [J81.0] Respiratory distress [R06.03]  [facility]/MWF/[facility]  1. End Stage Renal Disease with hyperkalemia: dialysis yesterday. Continue MWF schedule  2. Hypertension: well controlled. Restarted lisinopril and hydralazine.   3. Anemia of chronic kidney disease: PRBC transfusion was not done due to antibodies.   4. Secondary Hyperparathyroidism - lanthanum and sevelamer   LOS: 0 Demarrio Menges 7/1/20212:20 PM

## 2020-03-22 NOTE — Progress Notes (Signed)
SATURATION QUALIFICATIONS: (This note is used to comply with regulatory documentation for home oxygen) ? ?Patient Saturations on Room Air at Rest = 90% ? ?Patient Saturations on Room Air while Ambulating = 84% ? ?Patient Saturations on 2 Liters of oxygen while Ambulating = 93% ? ?Please briefly explain why patient needs home oxygen: ?

## 2020-03-22 NOTE — Clinical Social Work Note (Signed)
Patient was not on oxygen prior to admission. Per RN, patient meets saturation qualifications for 2 L home oxygen. MD will enter DME order. Sent referral to [facility] representative.  Dayton Scrape, [place]

## 2020-03-22 NOTE — Discharge Summary (Signed)
Physician Discharge Summary  [name] HDQ:[id number] DOB: 22-Aug-1966 DOA: 03/21/2020  PCP: Jearld Fenton, NP  Admit date: 03/21/2020 Discharge date: 03/22/2020  Admitted From: Home  Discharge disposition: Home   Recommendations for Outpatient Follow-Up:   . Follow up with your primary care provider in one week.  . Check CBC, BMP, magnesium in the next visit . Continue hemodialysis as outpatient.  Follow-up with nephrology as scheduled by the clinic. Jordan [name] Please consider sleep study as outpatient as patient seems to be snoring frequently.   Discharge Diagnosis:   Principal Problem:   Acute pulmonary edema (HCC) Active Problems:   ESRD on dialysis [facility])   Essential hypertension   Elevated troponin   Anemia in ESRD (end-stage renal disease) (HCC)   Acute on chronic combined systolic and diastolic CHF (congestive heart failure) (HCC)   Non-compliance with renal dialysis [facility])   Discharge Condition: Improved.  Diet recommendation: Low sodium, heart healthy.    Wound care: None.  Code status: Full.   History of Present Illness:   Jordan [name] is a 54 y.o. male with medical history significant for Jordan [name] a 53 y.o.malewith medical history significant forESRD on HD MWF, anemia of CKD with baseline of 8, chronic combined heart failure last EF 45 to 50% May 2021 who has had several hospitalizations for missed dialysis, discharged on 03/13/2020, and with last dialysis 5 days ago on 03/16/2020 who presents to the emergency room by EMS in respiratory distress requiring CPAP to assist with work of breathing.  O2 sat was in the 80s.   ED Course: On arrival, blood pressure was 165/112 with heart rate 103, respiratory rate 22, O2 sat 80% as he was transitioned to BiPAP in the ER.  Chest x-ray showed pulmonary vascular congestion.  EKG showed sinus tachycardia with no acute ST-T wave changes.  Blood work significant for troponin of 322, BNP 3600, hemoglobin 6.9.   Potassium normal.   ABG on BiPAP was unremarkable.  Patient treated with Nitropaste.  The emergency room provider contacted nephrologist Dr. Juleen China for dialysis and the patient was admitted to the hospital.  Hospital Course:   Following conditions were addressed during hospitalization as listed below,  Acute pulmonary edema secondary to fluid overload from missed dialysis with Acute respiratory failure with hypoxia/Acute on chronic combined systolic and diastolic CHF  Likely contributed by noncompliance to medication.  Last known ejection fraction of 45 to 50% with grade 3 diastolic dysfunction.    Received dialysis while in the hospital with improvement of volume overload and pulmonary edema.  Patient was subsequently weaned to room air   Generalized pruritus.  Resume home medication  ESRD on dialysis, with missed dialysis sessions Status post dialysis.  Patient was discharged need for being on medication.  Elevated troponin Likely related to demand ischemia from acute respiratory distress.   No chest pain or EKG changes.   Anemia in ESRD (end-stage renal disease) (HCC) Hemoglobin of 7.2 at this time.  Essential hypertension -Continue home meds.  Possibility of noncompliance.  Patient was noted to have extremely elevated blood pressure on presentation.  Episodes of snoring.  Possible obstructive sleep apnea.  Patient was encouraged to discuss this with his primary care physician and discuss about sleep study as outpatient.  Disposition.  At this time, patient is stable for disposition home.  Spoke with Dr. Juleen China nephrology prior to disposition.  I tried to reach the patient's brother on the phone but was unable to reach him.  Medical Consultants:    Nephrology  Procedures:    Hemodialysis Subjective:   Today, patient feels okay.  Denies any shortness of breath, cough, fever chills.  Wishes to go home.  Discharge Exam:   Vitals:   03/22/20 1030 03/22/20  1102  BP: (!) 163/91   Pulse: 99   Resp: 20   Temp: 97.8 F (36.6 C)   SpO2: 94% 93%   Vitals:   03/22/20 0630 03/22/20 0803 03/22/20 1030 03/22/20 1102  BP: (!) 146/90 (!) 162/89 (!) 163/91   Pulse: (!) 103 (!) 101 99   Resp: 18  20   Temp: 98.2 F (36.8 C) 97.6 F (36.4 C) 97.8 F (36.6 C)   TempSrc:  Oral Oral   SpO2: 93% 95% 94% 93%  Weight:      Height:        General: Alert awake, not in obvious distress, communicative HENT: pupils equally reacting to light,  No scleral pallor or icterus noted. Oral mucosa is moist.  Chest:    Diminished breath sounds bilaterally. No crackles or wheezes.  CVS: S1 &S2 heard. No murmur.  Regular rate and rhythm. Abdomen: Soft, nontender, nondistended.  Bowel sounds are heard.   Extremities: No cyanosis, clubbing or edema.  Peripheral pulses are palpable.  Left upper extremity AV fistula in place Psych: Alert, awake and oriented, normal mood CNS:  No cranial nerve deficits.  Power equal in all extremities.   Skin: Warm and dry.  No rashes noted.  The results of significant diagnostics from this hospitalization (including imaging, microbiology, ancillary and laboratory) are listed below for reference.     Diagnostic Studies:   DG Chest Port 1 View  Result Date: 03/21/2020 CLINICAL DATA:  Shortness of breath EXAM: PORTABLE CHEST 1 VIEW COMPARISON:  Nine days ago FINDINGS: Cardiomegaly and vascular congestion. No Kerley lines or effusion. No pneumothorax or focal consolidation. IMPRESSION: Cardiomegaly and vascular congestion. Electronically Signed   By: Monte Fantasia M.D.   On: 03/21/2020 04:10     Labs:   Basic Metabolic Panel: Recent Labs  Lab 03/21/20 0315 03/22/20 0406  NA 139 139  K 5.4* 4.9  CL 99 99  CO2 23 26  GLUCOSE 94 119*  BUN 71* 58*  CREATININE 17.04* 9.88*  CALCIUM 8.4* 8.8*  MG 2.3 2.0  PHOS 8.0* 6.0*   GFR Estimated Creatinine Clearance: 8 mL/min (A) (by C-G formula based on SCr of 9.88 mg/dL  (H)). Liver Function Tests: Recent Labs  Lab 03/21/20 0315  AST 12*  ALT 21  ALKPHOS 103  BILITOT 1.7*  PROT 7.3  ALBUMIN 3.5   No results for input(s): LIPASE, AMYLASE in the last 168 hours. No results for input(s): AMMONIA in the last 168 hours. Coagulation profile Recent Labs  Lab 03/21/20 0315  INR 1.4*    CBC: Recent Labs  Lab 03/21/20 0315 03/22/20 0406  WBC 8.7 7.5  NEUTROABS 6.6  --   HGB 6.9* 7.2*  HCT 21.9* 22.2*  MCV 100.9* 100.0  PLT 140* 141*   Cardiac Enzymes: No results for input(s): CKTOTAL, CKMB, CKMBINDEX, TROPONINI in the last 168 hours. BNP: Invalid input(s): POCBNP CBG: Recent Labs  Lab 03/21/20 0654  GLUCAP 66*   D-Dimer No results for input(s): DDIMER in the last 72 hours. Hgb A1c No results for input(s): HGBA1C in the last 72 hours. Lipid Profile No results for input(s): CHOL, HDL, LDLCALC, TRIG, CHOLHDL, LDLDIRECT in the last 72 hours. Thyroid function studies No results for  input(s): TSH, T4TOTAL, T3FREE, THYROIDAB in the last 72 hours.  Invalid input(s): FREET3 Anemia work up No results for input(s): VITAMINB12, FOLATE, FERRITIN, TIBC, IRON, RETICCTPCT in the last 72 hours. Microbiology Recent Results (from the past 240 hour(s))  SARS Coronavirus 2 by RT PCR (hospital order, performed in [facility] hospital lab) Nasopharyngeal Nasopharyngeal Swab     Status: None   Collection Time: 03/12/20  8:53 PM   Specimen: Nasopharyngeal Swab  Result Value Ref Range Status   SARS Coronavirus 2 NEGATIVE NEGATIVE Final    Comment: (NOTE) SARS-CoV-2 target nucleic acids are NOT DETECTED.  The SARS-CoV-2 RNA is generally detectable in upper and lower respiratory specimens during the acute phase of infection. The lowest concentration of SARS-CoV-2 viral copies this assay can detect is 250 copies / mL. A negative result does not preclude SARS-CoV-2 infection and should not be used as the sole basis for treatment or other patient  management decisions.  A negative result may occur with improper specimen collection / handling, submission of specimen other than nasopharyngeal swab, presence of viral mutation(s) within the areas targeted by this assay, and inadequate number of viral copies (<250 copies / mL). A negative result must be combined with clinical observations, patient history, and epidemiological information.  Fact Sheet for Patients:   StrictlyIdeas.no  Fact Sheet for Healthcare Providers: BankingDealers.co.za  This test is not yet approved or  cleared by the [place] FDA and has been authorized for detection and/or diagnosis of SARS-CoV-2 by FDA under an Emergency Use Authorization (EUA).  This EUA will remain in effect (meaning this test can be used) for the duration of the COVID-19 declaration under Section 564(b)(1) of the Act, 21 U.S.C. section 360bbb-3(b)(1), unless the authorization is terminated or revoked sooner.  Performed at [facility], [address] [place], [place] [address]   SARS Coronavirus 2 by RT PCR (hospital order, performed in [facility] hospital lab) Nasopharyngeal Nasopharyngeal Swab     Status: None   Collection Time: 03/21/20  3:10 AM   Specimen: Nasopharyngeal Swab  Result Value Ref Range Status   SARS Coronavirus 2 NEGATIVE NEGATIVE Final    Comment: (NOTE) SARS-CoV-2 target nucleic acids are NOT DETECTED.  The SARS-CoV-2 RNA is generally detectable in upper and lower respiratory specimens during the acute phase of infection. The lowest concentration of SARS-CoV-2 viral copies this assay can detect is 250 copies / mL. A negative result does not preclude SARS-CoV-2 infection and should not be used as the sole basis for treatment or other patient management decisions.  A negative result may occur with improper specimen collection / handling, submission of specimen other than nasopharyngeal swab, presence  of viral mutation(s) within the areas targeted by this assay, and inadequate number of viral copies (<250 copies / mL). A negative result must be combined with clinical observations, patient history, and epidemiological information.  Fact Sheet for Patients:   StrictlyIdeas.no  Fact Sheet for Healthcare Providers: BankingDealers.co.za  This test is not yet approved or  cleared by the [place] FDA and has been authorized for detection and/or diagnosis of SARS-CoV-2 by FDA under an Emergency Use Authorization (EUA).  This EUA will remain in effect (meaning this test can be used) for the duration of the COVID-19 declaration under Section 564(b)(1) of the Act, 21 U.S.C. section 360bbb-3(b)(1), unless the authorization is terminated or revoked sooner.  Performed at [facility], [address] [place], [place] [address]   MRSA PCR Screening  Status: None   Collection Time: 03/21/20 11:48 PM   Specimen: Nasopharyngeal  Result Value Ref Range Status   MRSA by PCR NEGATIVE NEGATIVE Final    Comment:        The GeneXpert MRSA Assay (FDA approved for NASAL specimens only), is one component of a comprehensive MRSA colonization surveillance program. It is not intended to diagnose MRSA infection nor to guide or monitor treatment for MRSA infections. Performed at [facility], [address] [place], [place] [address]      Discharge Instructions:   Discharge Instructions    Diet - low sodium heart healthy   Complete by: As directed    Discharge instructions   Complete by: As directed    Continue hemodialysis as scheduled.  Continue to take all the medications that have been prescribed.  Follow-up with your primary care provider in 1 week.   Increase activity slowly   Complete by: As directed      Allergies as of 03/22/2020   No Known Allergies     Medication List    TAKE these medications    calcium acetate 667 MG capsule Commonly known as: PHOSLO Take 2 capsules (1,334 mg total) by mouth 3 (three) times daily with meals. What changed: how much to take   Fosrenol 1000 MG Pack Generic drug: Lanthanum Carbonate Take 2 packets by mouth See admin instructions. MIX 2 PACKETS WITH SMALL AMOUNT OF APPLESAUCE OR SIMILAR FOOD. EAT IMMEDIATELY 3 TIMES/DAY WITH MEALS AND 1 PACKET WITH SNACKS   gabapentin 300 MG capsule Commonly known as: NEURONTIN Take 300 mg by mouth at bedtime.   hydrALAZINE 50 MG tablet Commonly known as: APRESOLINE Take 50 mg by mouth 3 (three) times daily.   hydrOXYzine 50 MG tablet Commonly known as: ATARAX/VISTARIL Take 50 mg by mouth 3 (three) times daily as needed for anxiety or itching.   patiromer 8.4 g packet Commonly known as: VELTASSA Take 1 packet (8.4 g total) by mouth daily.   quinapril 10 MG tablet Commonly known as: ACCUPRIL Take 10 mg by mouth every evening.   Sarna lotion Generic drug: camphor-menthol Apply 1 application topically as needed for itching.   sevelamer carbonate 800 MG tablet Commonly known as: RENVELA Take 800 mg by mouth 3 (three) times daily with meals.   triamcinolone cream 0.1 % Commonly known as: KENALOG Apply 1 application topically 2 (two) times daily. What changed:   when to take this  reasons to take this       [facility] Follow up on 04/17/2020.   Specialty: Cardiology Why: at 2:00pm. Enter through the [place] entrance Contact information: [place] [phone]               Time coordinating discharge: 39 minutes  Signed:  Stellan Vick  [facility] 03/22/2020, 11:30 AM

## 2020-03-22 NOTE — Care Management Obs Status (Signed)
[facility] NOTIFICATION   Patient Details  Name: Jordan [name] MRN: [id number] Date of Birth: October 16, 1965   Medicare Observation Status Notification Given:  Yes    Candie Chroman, LCSW 03/22/2020, 10:16 AM

## 2020-03-23 DIAGNOSIS — E8770 Fluid overload, unspecified: Secondary | ICD-10-CM | POA: Diagnosis not present

## 2020-03-23 DIAGNOSIS — D631 Anemia in chronic kidney disease: Secondary | ICD-10-CM | POA: Diagnosis not present

## 2020-03-23 DIAGNOSIS — N186 End stage renal disease: Secondary | ICD-10-CM | POA: Diagnosis not present

## 2020-03-23 DIAGNOSIS — N2581 Secondary hyperparathyroidism of renal origin: Secondary | ICD-10-CM | POA: Diagnosis not present

## 2020-03-23 DIAGNOSIS — Z992 Dependence on renal dialysis: Secondary | ICD-10-CM | POA: Diagnosis not present

## 2020-03-23 LAB — BPAM RBC
Blood Product Expiration Date: 202107202359
Blood Product Expiration Date: 202107202359
Unit Type and Rh: 9500
Unit Type and Rh: 9500

## 2020-03-23 LAB — TYPE AND SCREEN
ABO/RH(D): O NEG
Antibody Screen: POSITIVE
Unit division: 0
Unit division: 0

## 2020-03-28 ENCOUNTER — Telehealth: Payer: Self-pay | Admitting: Family

## 2020-03-28 NOTE — Telephone Encounter (Signed)
Unable to reach patient regarding his new patient CHF CLinic appointment that was made for him after his recent hosptial discharge.   Charna Neeb, NT

## 2020-04-03 ENCOUNTER — Inpatient Hospital Stay (HOSPITAL_COMMUNITY)
Admission: EM | Admit: 2020-04-03 | Discharge: 2020-04-10 | DRG: 291 | Discharge status: Against Medical Advice | Payer: Medicare Other | Attending: Critical Care Medicine | Admitting: Critical Care Medicine

## 2020-04-03 ENCOUNTER — Emergency Department (HOSPITAL_COMMUNITY): Payer: Medicare Other

## 2020-04-03 ENCOUNTER — Encounter (HOSPITAL_COMMUNITY): Payer: Self-pay | Admitting: Emergency Medicine

## 2020-04-03 DIAGNOSIS — E8770 Fluid overload, unspecified: Secondary | ICD-10-CM | POA: Diagnosis not present

## 2020-04-03 DIAGNOSIS — E872 Acidosis: Secondary | ICD-10-CM | POA: Diagnosis not present

## 2020-04-03 DIAGNOSIS — I5043 Acute on chronic combined systolic (congestive) and diastolic (congestive) heart failure: Secondary | ICD-10-CM | POA: Diagnosis present

## 2020-04-03 DIAGNOSIS — R0600 Dyspnea, unspecified: Secondary | ICD-10-CM

## 2020-04-03 DIAGNOSIS — Z992 Dependence on renal dialysis: Secondary | ICD-10-CM | POA: Diagnosis not present

## 2020-04-03 DIAGNOSIS — E8779 Other fluid overload: Secondary | ICD-10-CM | POA: Diagnosis not present

## 2020-04-03 DIAGNOSIS — I4891 Unspecified atrial fibrillation: Secondary | ICD-10-CM

## 2020-04-03 DIAGNOSIS — R0902 Hypoxemia: Secondary | ICD-10-CM | POA: Diagnosis not present

## 2020-04-03 DIAGNOSIS — Z20822 Contact with and (suspected) exposure to covid-19: Secondary | ICD-10-CM | POA: Diagnosis not present

## 2020-04-03 DIAGNOSIS — G9341 Metabolic encephalopathy: Secondary | ICD-10-CM | POA: Diagnosis not present

## 2020-04-03 DIAGNOSIS — F32A Depression, unspecified: Secondary | ICD-10-CM | POA: Diagnosis present

## 2020-04-03 DIAGNOSIS — J810 Acute pulmonary edema: Secondary | ICD-10-CM | POA: Diagnosis not present

## 2020-04-03 DIAGNOSIS — I5021 Acute systolic (congestive) heart failure: Secondary | ICD-10-CM | POA: Diagnosis not present

## 2020-04-03 DIAGNOSIS — I10 Essential (primary) hypertension: Secondary | ICD-10-CM | POA: Diagnosis present

## 2020-04-03 DIAGNOSIS — R6889 Other general symptoms and signs: Secondary | ICD-10-CM | POA: Diagnosis not present

## 2020-04-03 DIAGNOSIS — R451 Restlessness and agitation: Secondary | ICD-10-CM | POA: Diagnosis not present

## 2020-04-03 DIAGNOSIS — N2581 Secondary hyperparathyroidism of renal origin: Secondary | ICD-10-CM | POA: Diagnosis not present

## 2020-04-03 DIAGNOSIS — E7220 Disorder of urea cycle metabolism, unspecified: Secondary | ICD-10-CM | POA: Diagnosis not present

## 2020-04-03 DIAGNOSIS — I132 Hypertensive heart and chronic kidney disease with heart failure and with stage 5 chronic kidney disease, or end stage renal disease: Secondary | ICD-10-CM | POA: Diagnosis not present

## 2020-04-03 DIAGNOSIS — D539 Nutritional anemia, unspecified: Secondary | ICD-10-CM | POA: Diagnosis not present

## 2020-04-03 DIAGNOSIS — R0603 Acute respiratory distress: Secondary | ICD-10-CM

## 2020-04-03 DIAGNOSIS — D6489 Other specified anemias: Secondary | ICD-10-CM | POA: Diagnosis not present

## 2020-04-03 DIAGNOSIS — D631 Anemia in chronic kidney disease: Secondary | ICD-10-CM

## 2020-04-03 DIAGNOSIS — Z743 Need for continuous supervision: Secondary | ICD-10-CM | POA: Diagnosis not present

## 2020-04-03 DIAGNOSIS — R17 Unspecified jaundice: Secondary | ICD-10-CM | POA: Diagnosis present

## 2020-04-03 DIAGNOSIS — Z781 Physical restraint status: Secondary | ICD-10-CM

## 2020-04-03 DIAGNOSIS — Z5329 Procedure and treatment not carried out because of patient's decision for other reasons: Secondary | ICD-10-CM | POA: Diagnosis not present

## 2020-04-03 DIAGNOSIS — I5023 Acute on chronic systolic (congestive) heart failure: Secondary | ICD-10-CM | POA: Diagnosis not present

## 2020-04-03 DIAGNOSIS — E875 Hyperkalemia: Secondary | ICD-10-CM | POA: Diagnosis not present

## 2020-04-03 DIAGNOSIS — E162 Hypoglycemia, unspecified: Secondary | ICD-10-CM | POA: Diagnosis not present

## 2020-04-03 DIAGNOSIS — J811 Chronic pulmonary edema: Secondary | ICD-10-CM | POA: Diagnosis not present

## 2020-04-03 DIAGNOSIS — I313 Pericardial effusion (noninflammatory): Secondary | ICD-10-CM | POA: Diagnosis not present

## 2020-04-03 DIAGNOSIS — R739 Hyperglycemia, unspecified: Secondary | ICD-10-CM | POA: Diagnosis not present

## 2020-04-03 DIAGNOSIS — F23 Brief psychotic disorder: Secondary | ICD-10-CM | POA: Diagnosis not present

## 2020-04-03 DIAGNOSIS — J9601 Acute respiratory failure with hypoxia: Secondary | ICD-10-CM | POA: Diagnosis not present

## 2020-04-03 DIAGNOSIS — Z978 Presence of other specified devices: Secondary | ICD-10-CM

## 2020-04-03 DIAGNOSIS — R0602 Shortness of breath: Secondary | ICD-10-CM | POA: Diagnosis not present

## 2020-04-03 DIAGNOSIS — D696 Thrombocytopenia, unspecified: Secondary | ICD-10-CM | POA: Diagnosis present

## 2020-04-03 DIAGNOSIS — I509 Heart failure, unspecified: Secondary | ICD-10-CM | POA: Diagnosis not present

## 2020-04-03 DIAGNOSIS — N186 End stage renal disease: Secondary | ICD-10-CM | POA: Diagnosis not present

## 2020-04-03 DIAGNOSIS — Z841 Family history of disorders of kidney and ureter: Secondary | ICD-10-CM

## 2020-04-03 DIAGNOSIS — E876 Hypokalemia: Secondary | ICD-10-CM | POA: Diagnosis not present

## 2020-04-03 DIAGNOSIS — I480 Paroxysmal atrial fibrillation: Secondary | ICD-10-CM | POA: Diagnosis present

## 2020-04-03 DIAGNOSIS — F329 Major depressive disorder, single episode, unspecified: Secondary | ICD-10-CM | POA: Diagnosis not present

## 2020-04-03 DIAGNOSIS — R092 Respiratory arrest: Secondary | ICD-10-CM | POA: Diagnosis not present

## 2020-04-03 DIAGNOSIS — Z9115 Patient's noncompliance with renal dialysis: Secondary | ICD-10-CM

## 2020-04-03 DIAGNOSIS — Z91158 Patient's noncompliance with renal dialysis for other reason: Secondary | ICD-10-CM

## 2020-04-03 DIAGNOSIS — I953 Hypotension of hemodialysis: Secondary | ICD-10-CM | POA: Diagnosis not present

## 2020-04-03 DIAGNOSIS — R4020 Unspecified coma: Secondary | ICD-10-CM

## 2020-04-03 DIAGNOSIS — R410 Disorientation, unspecified: Secondary | ICD-10-CM | POA: Diagnosis not present

## 2020-04-03 DIAGNOSIS — I120 Hypertensive chronic kidney disease with stage 5 chronic kidney disease or end stage renal disease: Secondary | ICD-10-CM | POA: Diagnosis not present

## 2020-04-03 DIAGNOSIS — F17210 Nicotine dependence, cigarettes, uncomplicated: Secondary | ICD-10-CM | POA: Diagnosis present

## 2020-04-03 DIAGNOSIS — Z833 Family history of diabetes mellitus: Secondary | ICD-10-CM

## 2020-04-03 DIAGNOSIS — D649 Anemia, unspecified: Secondary | ICD-10-CM | POA: Diagnosis not present

## 2020-04-03 DIAGNOSIS — Z79899 Other long term (current) drug therapy: Secondary | ICD-10-CM

## 2020-04-03 DIAGNOSIS — E663 Overweight: Secondary | ICD-10-CM | POA: Diagnosis present

## 2020-04-03 DIAGNOSIS — F419 Anxiety disorder, unspecified: Secondary | ICD-10-CM | POA: Diagnosis present

## 2020-04-03 DIAGNOSIS — F10239 Alcohol dependence with withdrawal, unspecified: Secondary | ICD-10-CM | POA: Diagnosis not present

## 2020-04-03 DIAGNOSIS — Z6836 Body mass index (BMI) 36.0-36.9, adult: Secondary | ICD-10-CM

## 2020-04-03 DIAGNOSIS — N189 Chronic kidney disease, unspecified: Secondary | ICD-10-CM

## 2020-04-03 DIAGNOSIS — F320 Major depressive disorder, single episode, mild: Secondary | ICD-10-CM | POA: Diagnosis present

## 2020-04-03 DIAGNOSIS — J439 Emphysema, unspecified: Secondary | ICD-10-CM | POA: Diagnosis present

## 2020-04-03 LAB — CBC WITH DIFFERENTIAL/PLATELET
Abs Immature Granulocytes: 0.1 K/uL — ABNORMAL HIGH (ref 0.00–0.07)
Basophils Absolute: 0 K/uL (ref 0.0–0.1)
Basophils Relative: 0 %
Eosinophils Absolute: 0 K/uL (ref 0.0–0.5)
Eosinophils Relative: 0 %
HCT: 25 % — ABNORMAL LOW (ref 39.0–52.0)
Hemoglobin: 7.1 g/dL — ABNORMAL LOW (ref 13.0–17.0)
Immature Granulocytes: 1 %
Lymphocytes Relative: 5 %
Lymphs Abs: 0.3 K/uL — ABNORMAL LOW (ref 0.7–4.0)
MCH: 33 pg (ref 26.0–34.0)
MCHC: 28.4 g/dL — ABNORMAL LOW (ref 30.0–36.0)
MCV: 116.3 fL — ABNORMAL HIGH (ref 80.0–100.0)
Monocytes Absolute: 0.6 K/uL (ref 0.1–1.0)
Monocytes Relative: 9 %
Neutro Abs: 6 K/uL (ref 1.7–7.7)
Neutrophils Relative %: 85 %
Platelets: 165 K/uL (ref 150–400)
RBC: 2.15 MIL/uL — ABNORMAL LOW (ref 4.22–5.81)
RDW: 18.6 % — ABNORMAL HIGH (ref 11.5–15.5)
WBC: 7.1 K/uL (ref 4.0–10.5)
nRBC: 0 % (ref 0.0–0.2)

## 2020-04-03 LAB — BASIC METABOLIC PANEL
Anion gap: 26 — ABNORMAL HIGH (ref 5–15)
BUN: 206 mg/dL — ABNORMAL HIGH (ref 6–20)
CO2: 12 mmol/L — ABNORMAL LOW (ref 22–32)
Calcium: 9.7 mg/dL (ref 8.9–10.3)
Chloride: 102 mmol/L (ref 98–111)
Creatinine, Ser: 22.03 mg/dL — ABNORMAL HIGH (ref 0.61–1.24)
GFR calc Af Amer: 2 mL/min — ABNORMAL LOW (ref >60)
GFR calc non Af Amer: 2 mL/min — ABNORMAL LOW (ref >60)
Glucose, Bld: 94 mg/dL (ref 70–99)
Potassium: >7.5 mmol/L (ref 3.5–5.1)
Sodium: 140 mmol/L (ref 135–145)

## 2020-04-03 LAB — CBC
HCT: 20.3 % — ABNORMAL LOW (ref 39.0–52.0)
Hemoglobin: 6.4 g/dL — CL (ref 13.0–17.0)
MCH: 33.3 pg (ref 26.0–34.0)
MCHC: 31.5 g/dL (ref 30.0–36.0)
MCV: 105.7 fL — ABNORMAL HIGH (ref 80.0–100.0)
Platelets: 135 10*3/uL — ABNORMAL LOW (ref 150–400)
RBC: 1.92 MIL/uL — ABNORMAL LOW (ref 4.22–5.81)
RDW: 18.2 % — ABNORMAL HIGH (ref 11.5–15.5)
WBC: 7.1 10*3/uL (ref 4.0–10.5)
nRBC: 0 % (ref 0.0–0.2)

## 2020-04-03 LAB — I-STAT CHEM 8, ED
BUN: >130 mg/dL — ABNORMAL HIGH (ref 6–20)
Calcium, Ion: 1.06 mmol/L — ABNORMAL LOW (ref 1.15–1.40)
Chloride: 109 mmol/L (ref 98–111)
Creatinine, Ser: >18 mg/dL — ABNORMAL HIGH (ref 0.61–1.24)
Glucose, Bld: 89 mg/dL (ref 70–99)
HCT: 23 % — ABNORMAL LOW (ref 39.0–52.0)
Hemoglobin: 7.8 g/dL — ABNORMAL LOW (ref 13.0–17.0)
Potassium: 8.2 mmol/L (ref 3.5–5.1)
Sodium: 137 mmol/L (ref 135–145)
TCO2: 13 mmol/L — ABNORMAL LOW (ref 22–32)

## 2020-04-03 LAB — SARS CORONAVIRUS 2 BY RT PCR (HOSPITAL ORDER, PERFORMED IN ~~LOC~~ HOSPITAL LAB): SARS Coronavirus 2: NEGATIVE

## 2020-04-03 IMAGING — DX DG CHEST 1V PORT
1 series · 1 of 1 positions shown · non-contrast
Comparison: [DATE]

CLINICAL DATA: Hypoxemia, short of breath, end-stage renal disease

EXAM:
PORTABLE CHEST 1 VIEW

[chest]
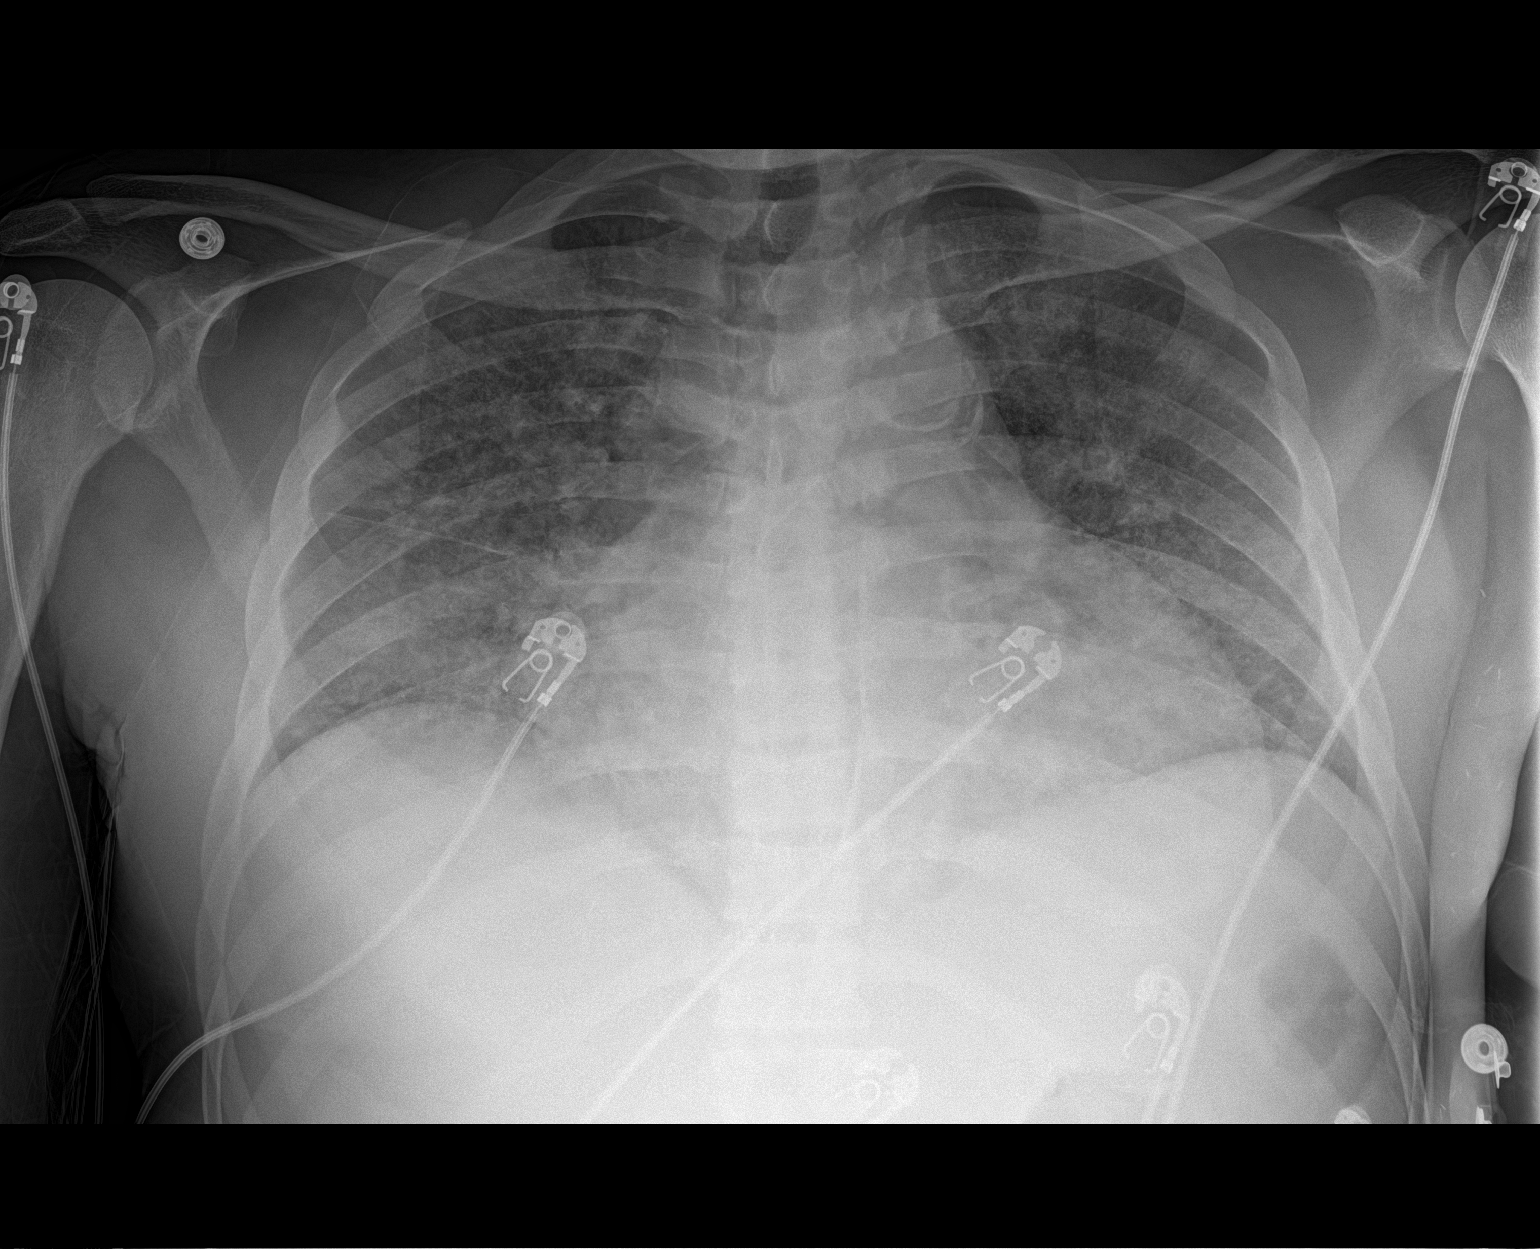

[1 of 1 positions shown; findings below may reference images not displayed]

FINDINGS: Single frontal view of the chest demonstrates an enlarged cardiac
silhouette. Diffuse interstitial prominence, ground-glass airspace
disease, and vascular congestion compatible with fluid overload. No
effusion or pneumothorax. No acute bony abnormalities.
IMPRESSION: 1. Pulmonary edema.

## 2020-04-03 MED ORDER — LIDOCAINE HCL (PF) 1 % IJ SOLN: 5.0000 mL | INTRAMUSCULAR | Status: DC | PRN

## 2020-04-03 MED ORDER — SODIUM CHLORIDE 0.9 % IV SOLN: 100.0000 mL | INTRAVENOUS | Status: DC | PRN

## 2020-04-03 MED ORDER — HEPARIN SODIUM (PORCINE) 1000 UNIT/ML DIALYSIS
1000.0000 [IU] | INTRAMUSCULAR | Status: DC | PRN
  Filled 2020-04-03: qty 1

## 2020-04-03 MED ORDER — LIDOCAINE-PRILOCAINE 2.5-2.5 % EX CREA
1.0000 "application " | TOPICAL_CREAM | CUTANEOUS | Status: DC | PRN
  Filled 2020-04-03: qty 5

## 2020-04-03 MED ORDER — ALTEPLASE 2 MG IJ SOLR: 2.0000 mg | Freq: Once | INTRAMUSCULAR | Status: DC | PRN

## 2020-04-03 MED ORDER — DEXTROSE 50 % IV SOLN
1.0000 | Freq: Once | INTRAVENOUS | Status: AC
  Administered 2020-04-03: 50 mL via INTRAVENOUS
  Filled 2020-04-03: qty 50

## 2020-04-03 MED ORDER — CHLORHEXIDINE GLUCONATE CLOTH 2 % EX PADS
6.0000 | MEDICATED_PAD | Freq: Every day | CUTANEOUS | Status: DC
  Administered 2020-04-05: 6 via TOPICAL

## 2020-04-03 MED ORDER — PENTAFLUOROPROP-TETRAFLUOROETH EX AERO
1.0000 "application " | INHALATION_SPRAY | CUTANEOUS | Status: DC | PRN
  Filled 2020-04-03: qty 116

## 2020-04-03 MED ORDER — INSULIN ASPART 100 UNIT/ML IV SOLN
5.0000 [IU] | Freq: Once | INTRAVENOUS | Status: AC
  Administered 2020-04-03: 5 [IU] via INTRAVENOUS

## 2020-04-03 NOTE — ED Triage Notes (Signed)
Pt arrives to ED with [facility] from home after a welfare check- pt has not been to dialysis in over 2 weeks. [facility] found pt to be 70% on room air having trouble breathing. Pt arrives on NRB 100% room air. States he didn't got to dialysis just because "he didn't feel like it".

## 2020-04-03 NOTE — Progress Notes (Signed)
tachycardia, c/o difficulty breathing Rapid response notified and Rapid Response nurse Ardelle Park RN in HD unit. TC to Dr. Augustin Coupe on-call Nephrologist order to D/C HX treatment pt received 2.46 hours. Critical [facility]bg called by lab of 6.4- Dr Augustin Coupe notified.

## 2020-04-03 NOTE — Progress Notes (Signed)
Frequent periods of tachycardia HR 128-157 over period of 10 minutes. pt without c/o asleep arouses easily states feels fine. TC to Dr Augustin Coupe reported tachycardia events per Dr. Augustin Coupe continue to monitor patient no intervention orders except to continue HD treatment with decrease goal of 2579ml as currently removing.

## 2020-04-03 NOTE — ED Provider Notes (Signed)
Jordan [name]   CSN: [id number] Arrival date & time: 04/03/20  1626     History Chief Complaint  Patient presents with  . Shortness of Breath    Jordan [name] is a 54 y.o. man with history significant for ESRD on HD WMF, anemia of CKD with baseline of 8, chronic combined heart failure last EF 45 to 50% May 2021 who has had several hospitalizations for missed dialysis, who presents to the ED by EMS in respiratory distress requiring NRB.  Patient was brought from home by EMS after a welfare check. Reports he has not been to dialysis in over 2 weeks. Reportedly told triage he didn't go to dialysis because "he didn't feel like it."   Denies fever/chills, cough, CP, N/V. History somewhat limited due to his respiratory distress.    Past Medical History:  Diagnosis Date  . Anemia   . Aortic atherosclerosis ([place]) 11/12/2019  . CKD (chronic kidney disease)    Stage 5  Dialysis - M/W/F in [place], [place]  . Dyspnea    tx with inhaler when sick  . ED (erectile dysfunction)   . Emphysema of lung ([place]) 11/12/2019  . ETOH abuse   . History of blood transfusion   . Hypertension   . Wears dentures     Patient Active Problem List   Diagnosis Date Noted  . Non-compliance with renal dialysis ([place]) 03/21/2020  . Acute CHF (congestive heart failure) ([place]) 03/12/2020  . 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 ([place]) 02/03/2020  . Acute on chronic combined systolic and diastolic CHF (congestive heart failure) ([place]) 02/03/2020  . Pulmonary edema 01/31/2020  . Syncopal episodes 01/24/2020  . Macrocytic anemia 01/24/2020  . Hyperkalemia 01/24/2020  . Fluid overload 11/12/2019  . Acute respiratory failure with hypoxia ([place]) 11/12/2019  . Aortic atherosclerosis ([place]) 11/12/2019  . Emphysema of lung ([place]) 11/12/2019  . Acute pulmonary edema ([place]) 10/31/2019  . Acute on chronic congestive heart failure ([place]) 10/22/2019  . Acute  respiratory distress 10/06/2019  . Volume overload 10/05/2019  . Hypokalemia 10/05/2019  . ESRD (end stage renal disease) ([place]) 10/01/2019  . Leg mass, right   . Hyperbilirubinemia   . Symptomatic anemia 09/19/2019  . Pruritus 09/19/2019  . Hemoptysis 02/11/2019  . Hypercalcemia 01/19/2019  . Elevated troponin 05/17/2018  . Anemia in ESRD (end-stage renal disease) ([place]) 05/17/2018  . Hepatitis B 03/19/2018  . ESRD on dialysis ([place]) 05/26/2017  . Essential hypertension 05/26/2017  . Urinary tract infection, site not specified 10/17/2016  . Coagulation defect, unspecified ([place]) 08/13/2016  . Diarrhea, unspecified 08/13/2016  . Encounter for immunization 08/13/2016  . Headache, unspecified 08/13/2016  . Hypertensive chronic kidney disease with stage 5 chronic kidney disease or end stage renal disease ([place]) 08/13/2016  . Iron deficiency anemia, unspecified 08/13/2016  . Pain, unspecified 08/13/2016  . Moderate protein-calorie malnutrition ([place]) 08/13/2016  . Secondary hyperparathyroidism of renal origin ([place]) 08/13/2016    Past Surgical History:  Procedure Laterality Date  . BASCILIC VEIN TRANSPOSITION Left 08/07/2016   Procedure: LEFT BASILIC VEIN TRANSPOSITION;  Surgeon: Angelia Mould, MD;  Location: [facility];  Service: Vascular;  Laterality: Left;  . CLOSED REDUCTION NASAL FRACTURE N/A 08/11/2019   Procedure: CLOSED REDUCTION NASAL FRACTURE WITH STABILIZATION;  Surgeon: Irene Limbo, MD;  Location: [facility];  Service: Plastics;  Laterality: N/A;  . COLONOSCOPY N/A 08/12/2016   Procedure: COLONOSCOPY;  Surgeon: Otis Brace, MD;  Location: [facility];  Service:  Gastroenterology;  Laterality: N/A;  . ESOPHAGOGASTRODUODENOSCOPY N/A 08/12/2016   Procedure: ESOPHAGOGASTRODUODENOSCOPY (EGD);  Surgeon: Otis Brace, MD;  Location: [facility];  Service: Gastroenterology;  Laterality: N/A;  . INSERTION OF DIALYSIS CATHETER N/A 08/07/2016   Procedure: INSERTION OF  TUNNELED DIALYSIS CATHETER;  Surgeon: Angelia Mould, MD;  Location: [facility];  Service: Vascular;  Laterality: N/A;  . LIGATION OF ARTERIOVENOUS  FISTULA Left 09/12/2016   Procedure: BANDING OF LEFT  ARTERIOVENOUS  FISTULA;  Surgeon: Angelia Mould, MD;  Location: [facility];  Service: Vascular;  Laterality: Left;  . LOWER EXTREMITY INTERVENTION Right 12/02/2018   Procedure: LOWER EXTREMITY INTERVENTION;  Surgeon: Algernon Huxley, MD;  Location: [facility];  Service: Cardiovascular;  Laterality: Right;       Family History  Problem Relation Age of Onset  . Diabetes Mother   . Kidney failure Mother   . Healthy Father   . Kidney failure Brother   . Healthy Sister   . Kidney disease Daughter   . Post-traumatic stress disorder Neg Hx   . Bladder Cancer Neg Hx   . Kidney cancer Neg Hx     Social History   Tobacco Use  . Smoking status: Current Every Day Smoker    Packs/day: 0.50    Years: 40.00    Pack years: 20.00    Types: Cigarettes  . Smokeless tobacco: Never Used  Vaping Use  . Vaping Use: Never used  Substance Use Topics  . Alcohol use: Yes    Alcohol/week: 21.0 standard drinks    Types: 21 Cans of beer per week    Comment: none since 08/04/16  . Drug use: No    Home Medications Prior to Admission medications   Medication Sig Start Date End Date Taking? Authorizing Provider  calcium acetate (PHOSLO) 667 MG capsule Take 2 capsules (1,334 mg total) by mouth 3 (three) times daily with meals. Patient taking differently: Take 2,001 mg by mouth 3 (three) times daily with meals.  02/12/19   Fritzi Mandes, MD  camphor-menthol [facility]) lotion Apply 1 application topically as needed for itching. 11/23/19   Upstill, Shari, PA-C  FOSRENOL 1000 MG PACK Take 2 packets by mouth See admin instructions. MIX 2 PACKETS WITH SMALL AMOUNT OF APPLESAUCE OR SIMILAR FOOD. EAT IMMEDIATELY 3 TIMES/DAY WITH MEALS AND 1 PACKET WITH SNACKS Patient not taking: Reported on 03/22/2020 02/27/20    [provider]  gabapentin (NEURONTIN) 300 MG capsule Take 300 mg by mouth at bedtime. Patient not taking: Reported on 03/22/2020    [provider]  hydrALAZINE (APRESOLINE) 50 MG tablet Take 50 mg by mouth 3 (three) times daily. Patient not taking: Reported on 03/22/2020    [provider]  hydrOXYzine (ATARAX/VISTARIL) 50 MG tablet Take 50 mg by mouth 3 (three) times daily as needed for anxiety or itching.  Patient not taking: Reported on 03/22/2020 08/09/19   [provider]  patiromer Daryll Drown) 8.4 g packet Take 1 packet (8.4 g total) by mouth daily. 03/14/20   Lorella Nimrod, MD  quinapril (ACCUPRIL) 10 MG tablet Take 10 mg by mouth every evening. Patient not taking: Reported on 03/22/2020    [provider]  sevelamer carbonate (RENVELA) 800 MG tablet Take 800 mg by mouth 3 (three) times daily with meals.    [provider]  triamcinolone cream (KENALOG) 0.1 % Apply 1 application topically 2 (two) times daily. Patient taking differently: Apply 1 application topically 2 (two) times daily as needed (itching).  11/23/19   Charlann Lange, PA-C    Allergies    Patient has no known allergies.  Review of Systems   Review of Systems  Constitutional: Negative for chills and fever.  Respiratory: Positive for shortness of breath. Negative for cough.   Cardiovascular: Positive for leg swelling. Negative for chest pain.  Gastrointestinal: Negative for nausea and vomiting.  Neurological: Negative for dizziness.    Physical Exam Updated Vital Signs BP (!) 171/103 (BP Location: Right Arm)   Pulse 100   Resp 16   SpO2 100%   Physical Exam Constitutional:      General: He is not in acute distress.    Appearance: He is not ill-appearing.  HENT:     Head: Normocephalic and atraumatic.  Eyes:     Extraocular Movements: Extraocular movements intact.  Neck:     Vascular: JVD present.     Comments: JVD to the jaw. Cardiovascular:     Rate and  Rhythm: Regular rhythm. Tachycardia present.     Pulses: Normal pulses.     Heart sounds: Normal heart sounds.     Arteriovenous access: left arteriovenous access is present.    Comments: Bilateral 2+ pitting edema to the knees. Left AV access with palpable thrill. Pulmonary:     Effort: Tachypnea present.     Breath sounds: Examination of the right-middle field reveals rales. Examination of the left-middle field reveals rales. Examination of the right-lower field reveals rales. Examination of the left-lower field reveals rales. Rales present.  Abdominal:     Palpations: Abdomen is soft.  Musculoskeletal:     Right lower leg: 2+ Pitting Edema present.     Left lower leg: 2+ Pitting Edema present.  Skin:    General: Skin is warm and dry.  Neurological:     Mental Status: He is alert.     ED Results / Procedures / Treatments   Labs (all labs ordered are listed, but only abnormal results are displayed) Labs Reviewed  CBC WITH DIFFERENTIAL/PLATELET - Abnormal; Notable for the following components:      Result Value   RBC 2.15 (*)    Hemoglobin 7.1 (*)    HCT 25.0 (*)    MCV 116.3 (*)    MCHC 28.4 (*)    RDW 18.6 (*)    All other components within normal limits  I-STAT CHEM 8, ED - Abnormal; Notable for the following components:   Potassium 8.2 (*)    BUN >130 (*)    Creatinine, Ser >18.00 (*)    Calcium, Ion 1.06 (*)    TCO2 13 (*)    Hemoglobin 7.8 (*)    HCT 23.0 (*)    All other components within normal limits  SARS CORONAVIRUS 2 BY RT PCR (HOSPITAL ORDER, [facility])  BASIC METABOLIC PANEL    EKG EKG Interpretation  Date/Time:  Tuesday April 03 2020 17:08:20 EDT Ventricular Rate:  99 PR Interval:    QRS Duration: 116 QT Interval:  351 QTC Calculation: 451 R Axis:   84 Text Interpretation: Incomplete analysis due to missing data in precordial lead(s) Sinus rhythm Borderline prolonged PR interval Incomplete right bundle branch block  Missing lead(s): V5 Otherwise no significant change Confirmed by Deno Etienne [phone]) on 04/03/2020 5:41:20 PM   Radiology DG Chest Port 1 View  Result Date: 04/03/2020 CLINICAL DATA:  Hypoxemia, short of breath, end-stage renal disease EXAM: PORTABLE CHEST 1 VIEW COMPARISON:  03/21/2020 FINDINGS: Single frontal view of the  chest demonstrates an enlarged cardiac silhouette. Diffuse interstitial prominence, ground-glass airspace disease, and vascular congestion compatible with fluid overload. No effusion or pneumothorax. No acute bony abnormalities. IMPRESSION: 1. Pulmonary edema. Electronically Signed   By: Randa Ngo M.D.   On: 04/03/2020 16:51    Procedures Procedures (including critical care time)  Medications Ordered in ED Medications - No data to display  ED Course  I have reviewed the triage vital signs and the nursing notes.  Pertinent labs & imaging results that were available during my care of the patient were reviewed by me and considered in my medical decision making (see chart for details).    MDM Rules/Calculators/A&P                          Jordan [name] is a 54 y.o. man with history significant for ESRD on HD WMF, anemia of CKD with baseline of 8, chronic combined heart failure last EF 45 to 50% May 2021 who has had several hospitalizations for missed dialysis, who presents to the ED by EMS in respiratory distress requiring NRB 100%.   Patient non-toxic appearing, tachypneic, tachycardic, and hypertensive with bilateral rales, JVD to the jaw, 2+ pitting edema to the knees bilaterally, satting 100% on 100% NRB. Presentation likely secondary to volume overload in the setting of ESRD with missed dialysis, complicated by history of heart failure. Differential diagnosis includes pulmonary infection, COVID-19, symptomatic anemia. Will obtain I-stat chem 8, CBC with diff, BMP, SARS-CoV test, CXR, EKG. Anticipate he will need urgent dialysis.  - CXR demonstrates pulmonary  edema. Low concern for pneumonia. - Potassium 8.2, BUN/Cr >130/>18. EKG with slightly peaked T waves. Will consult nephrology and give insulin. - Discussed with nephrology who will see patient.   Patient will be dialyzed and then likely sent back to the ED for reevaluation. If stable and well-appearing, will likely discharge home with close follow-up with outpatient dialysis.  Final Clinical Impression(s) / ED Diagnoses Final diagnoses:  None    Rx / DC Orders ED Discharge Orders    None       Alexandria Lodge, MD 04/03/20 [place], [place], DO 04/03/20 2257

## 2020-04-03 NOTE — Significant Event (Signed)
Rapid Response Event Note  Overview:  Called by HD RN for pt c/o SOB during HD and HR 130-150s. Pt has been on HD for about 2.5 hrs with just over 2L taken off (ordered to remove 3L as tolerated).     Initial Focused Assessment: On arrival, pt lethargic in bed. spO2 98% on 4L [place] (per HD RN drops his sats when O2 removed). Pt does arouse to voice but not able to answer questions d/t lethargy. Lung sounds coarse with wheezing noted.   VS: HR 145, BP 116/81, spO2 98%, RR 32  Interventions:  HD RN bolused 169ml and called nephrologist prior to my arrival. Ordered to stop HD. Removed just under 1528ml during treatment.   Bipap EKG- Afib RVR Abg   Plan of Care (if not transferred): Called ED charge to inform of pt status and need for bipap at the moment.   VS: HR 114, BP 151/93, RR 18, spO2 98% on bipap. RN instructed to call with any changes or concerns. Awaiting ED bed.   Event Summary:  Called at  2309   Event ended at  [place]

## 2020-04-03 NOTE — Consult Note (Addendum)
Reason for Consult: ESRD Referring Physician:  Dr. Tyrone Nine  Chief Complaint:  Shortness of breath  Dialysis orders: MWF 4hr at [facility] [place] 180NR EDW 68kg (leaving at 71.1 kg for many treatments) Mircera 225 q2weeks last given 01/27/20 Calcitriol 75mcg PO TIW Lt BCF  Assessment/Plan: 1. End Stage Renal Disease with hyperkalemia: last treatment 7/2; plan on dialysis emergently and then should be able to go home if supplemental O2 req is no longer required. I spent time counseling him about the effects and hazards of missing dialysis.   2. Hypertension:  Restart lisinopril and hydralazine.   3. Anemia of chronic kidney disease  - Transfuse as necessary - Will redose Mircera (last given 5/7)   4. Secondary Hyperparathyroidism - Continue lanthanum and sevelamer   HPI: Jordan [name] is an 54 y.o. male  end stage renal disease on hemodialysis, alcohol abuse, erectile dysfunction, hypertension who presents with shortness of breath after missing multiple dialysis treatments; last treatment was on 7/2 where he left at 71.1kg. Brought by EMS after a welfare check but denies f/c/ myalgias/ n/v. His brother was shot recently but he also states he didn't feel a need to go to dialysis.   ROS Pertinent items are noted in HPI.  Chemistry and CBC: Creatinine, Ser  Date/Time Value Ref Range Status  04/03/2020 05:37 PM >18.00 (H) 0.61 - 1.24 mg/dL Final  04/03/2020 05:10 PM 22.03 (H) 0.61 - 1.24 mg/dL Final  03/22/2020 04:06 AM 9.88 (H) 0.61 - 1.24 mg/dL Final  03/21/2020 03:15 AM 17.04 (H) 0.61 - 1.24 mg/dL Final  03/13/2020 05:34 AM 24.88 (H) 0.61 - 1.24 mg/dL Final  03/12/2020 05:35 PM 24.89 (H) 0.61 - 1.24 mg/dL Final  02/20/2020 04:20 AM 14.77 (H) 0.61 - 1.24 mg/dL Final  02/04/2020 09:31 AM 6.66 (H) 0.61 - 1.24 mg/dL Final    Comment:    DIALYSIS  02/03/2020 05:20 PM 12.60 (H) 0.61 - 1.24 mg/dL Final  02/03/2020 06:48 AM 12.06 (H) 0.61 - 1.24 mg/dL Final  02/01/2020 04:16 AM 14.57  (H) 0.61 - 1.24 mg/dL Final  01/31/2020 09:23 PM 14.20 (H) 0.61 - 1.24 mg/dL Final  01/25/2020 05:00 AM 13.08 (H) 0.61 - 1.24 mg/dL Final    Comment:    DELTA CHECK NOTED DIALYSIS   01/24/2020 01:47 AM 20.08 (H) 0.61 - 1.24 mg/dL Final  12/09/2019 02:39 AM 15.07 (H) 0.61 - 1.24 mg/dL Final  11/23/2019 05:23 AM 19.65 (H) 0.61 - 1.24 mg/dL Final  11/12/2019 03:30 PM 8.89 (H) 0.61 - 1.24 mg/dL Final  11/12/2019 06:48 AM 7.72 (H) 0.61 - 1.24 mg/dL Final  11/11/2019 09:18 PM 6.87 (H) 0.61 - 1.24 mg/dL Final  11/01/2019 03:18 AM 12.42 (H) 0.61 - 1.24 mg/dL Final  10/31/2019 10:00 AM 18.25 (H) 0.61 - 1.24 mg/dL Final  10/31/2019 06:30 AM 17.21 (H) 0.61 - 1.24 mg/dL Final  10/31/2019 03:01 AM >18.00 (H) 0.61 - 1.24 mg/dL Final  10/31/2019 02:56 AM 17.41 (H) 0.61 - 1.24 mg/dL Final  10/25/2019 11:07 AM 12.17 (H) 0.61 - 1.24 mg/dL Final  10/23/2019 06:10 AM 5.94 (H) 0.61 - 1.24 mg/dL Final  10/22/2019 09:28 AM 6.86 (H) 0.61 - 1.24 mg/dL Final  10/22/2019 03:58 AM 5.97 (H) 0.61 - 1.24 mg/dL Final  10/06/2019 04:28 AM 12.50 (H) 0.61 - 1.24 mg/dL Final  10/05/2019 04:53 PM 11.29 (H) 0.61 - 1.24 mg/dL Final  10/01/2019 04:29 AM 12.23 (H) 0.61 - 1.24 mg/dL Final  09/22/2019 04:58 AM 9.10 (H) 0.61 - 1.24 mg/dL Final  09/21/2019  04:14 AM 13.64 (H) 0.61 - 1.24 mg/dL Final  09/20/2019 04:54 AM 11.25 (H) 0.61 - 1.24 mg/dL Final  09/19/2019 02:47 PM 9.67 (H) 0.61 - 1.24 mg/dL Final  08/11/2019 06:37 AM 13.09 (H) 0.61 - 1.24 mg/dL Final  07/22/2019 01:09 PM 15.10 (H) 0.61 - 1.24 mg/dL Final  07/22/2019 12:38 PM 14.14 (H) 0.61 - 1.24 mg/dL Final  02/18/2019 07:30 AM 13.00 (H) 0.61 - 1.24 mg/dL Final  02/12/2019 05:45 AM 11.44 (H) 0.61 - 1.24 mg/dL Final  02/11/2019 01:54 PM 15.76 (H) 0.61 - 1.24 mg/dL Final  12/03/2018 04:36 AM 8.05 (H) 0.61 - 1.24 mg/dL Final  12/02/2018 04:50 AM 5.51 (H) 0.61 - 1.24 mg/dL Final  12/01/2018 05:44 AM 8.36 (H) 0.61 - 1.24 mg/dL Final  11/30/2018 08:07 AM 13.63 (H) 0.61  - 1.24 mg/dL Final  11/15/2018 12:35 PM 14.35 (H) 0.61 - 1.24 mg/dL Final  10/03/2018 02:52 AM 16.49 (H) 0.61 - 1.24 mg/dL Final  09/10/2018 12:55 AM 12.77 (H) 0.61 - 1.24 mg/dL Final  07/19/2018 03:49 AM 20.49 (H) 0.61 - 1.24 mg/dL Final  07/19/2018 12:03 AM 19.74 (H) 0.61 - 1.24 mg/dL Final  07/18/2018 09:10 PM 19.14 (H) 0.61 - 1.24 mg/dL Final  07/18/2018 08:25 PM >18.00 (H) 0.61 - 1.24 mg/dL Final   Recent Labs  Lab 04/03/20 1710 04/03/20 1737  NA 140 137  K >7.5* 8.2*  CL 102 109  CO2 12*  --   GLUCOSE 94 89  BUN 206* >130*  CREATININE 22.03* >18.00*  CALCIUM 9.7  --    Recent Labs  Lab 04/03/20 1710 04/03/20 1737  WBC 7.1  --   NEUTROABS PENDING  --   HGB 7.1* 7.8*  HCT 25.0* 23.0*  MCV 116.3*  --   PLT 165  --    Liver Function Tests: No results for input(s): AST, ALT, ALKPHOS, BILITOT, PROT, ALBUMIN in the last 168 hours. No results for input(s): LIPASE, AMYLASE in the last 168 hours. No results for input(s): AMMONIA in the last 168 hours. Cardiac Enzymes: No results for input(s): CKTOTAL, CKMB, CKMBINDEX, TROPONINI in the last 168 hours. Iron Studies: No results for input(s): IRON, TIBC, TRANSFERRIN, FERRITIN in the last 72 hours. PT/INR: @LABRCNTIP (inr:5)  Xrays/Other Studies: ) Results for orders placed or performed during the hospital encounter of 04/03/20 (from the past 48 hour(s))  CBC with Differential     Status: Abnormal (Preliminary result)   Collection Time: 04/03/20  5:10 PM  Result Value Ref Range   WBC 7.1 4.0 - 10.5 K/uL   RBC 2.15 (L) 4.22 - 5.81 MIL/uL   Hemoglobin 7.1 (L) 13.0 - 17.0 g/dL   HCT 25.0 (L) 39 - 52 %   MCV 116.3 (H) 80.0 - 100.0 fL   MCH 33.0 26.0 - 34.0 pg   MCHC 28.4 (L) 30.0 - 36.0 g/dL   RDW 18.6 (H) 11.5 - 15.5 %   Platelets 165 150 - 400 K/uL   nRBC 0.0 0.0 - 0.2 %    Comment: Performed at [facility], [address], [place], [place] [address]   Neutrophils Relative % PENDING %   Neutro Abs PENDING 1.7 -  7.7 K/uL   Band Neutrophils PENDING %   Lymphocytes Relative PENDING %   Lymphs Abs PENDING 0.7 - 4.0 K/uL   Monocytes Relative PENDING %   Monocytes Absolute PENDING 0 - 1 K/uL   Eosinophils Relative PENDING %   Eosinophils Absolute PENDING 0 - 0 K/uL   Basophils  Relative PENDING %   Basophils Absolute PENDING 0 - 0 K/uL   WBC Morphology PENDING    RBC Morphology PENDING    Smear Review PENDING    Other PENDING %   nRBC PENDING 0 /100 WBC   Metamyelocytes Relative PENDING %   Myelocytes PENDING %   Promyelocytes Relative PENDING %   Blasts PENDING %   Immature Granulocytes PENDING %   Abs Immature Granulocytes PENDING 0.00 - 0.07 K/uL  Basic metabolic panel     Status: Abnormal   Collection Time: 04/03/20  5:10 PM  Result Value Ref Range   Sodium 140 135 - 145 mmol/L   Potassium >7.5 (HH) 3.5 - 5.1 mmol/L    Comment: NO VISIBLE HEMOLYSIS CRITICAL RESULT CALLED TO, READ BACK BY AND VERIFIED WITH: J.EASLEY,RN 1835 04/03/20 CLARK,S    Chloride 102 98 - 111 mmol/L   CO2 12 (L) 22 - 32 mmol/L   Glucose, Bld 94 70 - 99 mg/dL    Comment: Glucose reference range applies only to samples taken after fasting for at least 8 hours.   BUN 206 (H) 6 - 20 mg/dL   Creatinine, Ser 22.03 (H) 0.61 - 1.24 mg/dL   Calcium 9.7 8.9 - 10.3 mg/dL   GFR calc non Af Amer 2 (L) >60 mL/min   GFR calc Af Amer 2 (L) >60 mL/min   Anion gap 26 (H) 5 - 15    Comment: Performed at [facility] [address] [place],  [address]  I-stat chem 8, ED (not at [facility] or [facility])     Status: Abnormal   Collection Time: 04/03/20  5:37 PM  Result Value Ref Range   Sodium 137 135 - 145 mmol/L   Potassium 8.2 (HH) 3.5 - 5.1 mmol/L   Chloride 109 98 - 111 mmol/L   BUN >130 (H) 6 - 20 mg/dL   Creatinine, Ser >18.00 (H) 0.61 - 1.24 mg/dL   Glucose, Bld 89 70 - 99 mg/dL    Comment: Glucose reference range applies only to samples taken after fasting for at least 8 hours.   Calcium, Ion 1.06 (L) 1.15 - 1.40  mmol/L   TCO2 13 (L) 22 - 32 mmol/L   Hemoglobin 7.8 (L) 13.0 - 17.0 g/dL   HCT 23.0 (L) 39 - 52 %   DG Chest Port 1 View  Result Date: 04/03/2020 CLINICAL DATA:  Hypoxemia, short of breath, end-stage renal disease EXAM: PORTABLE CHEST 1 VIEW COMPARISON:  03/21/2020 FINDINGS: Single frontal view of the chest demonstrates an enlarged cardiac silhouette. Diffuse interstitial prominence, ground-glass airspace disease, and vascular congestion compatible with fluid overload. No effusion or pneumothorax. No acute bony abnormalities. IMPRESSION: 1. Pulmonary edema. Electronically Signed   By: Randa Ngo M.D.   On: 04/03/2020 16:51    PMH:   Past Medical History:  Diagnosis Date  . Anemia   . Aortic atherosclerosis ([place]) 11/12/2019  . CKD (chronic kidney disease)    Stage 5  Dialysis - M/W/F in [place], [place]  . Dyspnea    tx with inhaler when sick  . ED (erectile dysfunction)   . Emphysema of lung ([place]) 11/12/2019  . ETOH abuse   . History of blood transfusion   . Hypertension   . Wears dentures     PSH:   Past Surgical History:  Procedure Laterality Date  . BASCILIC VEIN TRANSPOSITION Left 08/07/2016   Procedure: LEFT BASILIC VEIN TRANSPOSITION;  Surgeon: Angelia Mould, MD;  Location:  [facility] OR;  Service: Vascular;  Laterality: Left;  . CLOSED REDUCTION NASAL FRACTURE N/A 08/11/2019   Procedure: CLOSED REDUCTION NASAL FRACTURE WITH STABILIZATION;  Surgeon: Irene Limbo, MD;  Location: [place];  Service: Plastics;  Laterality: N/A;  . COLONOSCOPY N/A 08/12/2016   Procedure: COLONOSCOPY;  Surgeon: Otis Brace, MD;  Location: [place];  Service: Gastroenterology;  Laterality: N/A;  . ESOPHAGOGASTRODUODENOSCOPY N/A 08/12/2016   Procedure: ESOPHAGOGASTRODUODENOSCOPY (EGD);  Surgeon: Otis Brace, MD;  Location: [place];  Service: Gastroenterology;  Laterality: N/A;  . INSERTION OF DIALYSIS CATHETER N/A 08/07/2016   Procedure: INSERTION OF TUNNELED DIALYSIS  CATHETER;  Surgeon: Angelia Mould, MD;  Location: [place];  Service: Vascular;  Laterality: N/A;  . LIGATION OF ARTERIOVENOUS  FISTULA Left 09/12/2016   Procedure: BANDING OF LEFT  ARTERIOVENOUS  FISTULA;  Surgeon: Angelia Mould, MD;  Location: [place];  Service: Vascular;  Laterality: Left;  . LOWER EXTREMITY INTERVENTION Right 12/02/2018   Procedure: LOWER EXTREMITY INTERVENTION;  Surgeon: [place]non Huxley, MD;  Location: [place] [facility];  Service: Cardiovascular;  Laterality: Right;    Allergies: No Known Allergies  Medications:   Prior to Admission medications   Medication Sig Start Date End Date Taking? Authorizing Provider  calcium acetate (PHOSLO) 667 MG capsule Take 2 capsules (1,334 mg total) by mouth 3 (three) times daily with meals. Patient taking differently: Take 2,001 mg by mouth 3 (three) times daily with meals.  02/12/19   Fritzi Mandes, MD  camphor-menthol [facility]) lotion Apply 1 application topically as needed for itching. 11/23/19   Upstill, Shari, PA-C  FOSRENOL 1000 MG PACK Take 2 packets by mouth See admin instructions. MIX 2 PACKETS WITH SMALL AMOUNT OF APPLESAUCE OR SIMILAR FOOD. EAT IMMEDIATELY 3 TIMES/DAY WITH MEALS AND 1 PACKET WITH SNACKS Patient not taking: Reported on 03/22/2020 02/27/20   [provider]  gabapentin (NEURONTIN) 300 MG capsule Take 300 mg by mouth at bedtime. Patient not taking: Reported on 03/22/2020    [provider]  hydrALAZINE (APRESOLINE) 50 MG tablet Take 50 mg by mouth 3 (three) times daily. Patient not taking: Reported on 03/22/2020    [provider]  hydrOXYzine (ATARAX/VISTARIL) 50 MG tablet Take 50 mg by mouth 3 (three) times daily as needed for anxiety or itching.  Patient not taking: Reported on 03/22/2020 08/09/19   [provider]  patiromer Daryll Drown) 8.4 g packet Take 1 packet (8.4 g total) by mouth daily. 03/14/20   Lorella Nimrod, MD  quinapril (ACCUPRIL) 10 MG tablet Take 10 mg by mouth every  evening. Patient not taking: Reported on 03/22/2020    [provider]  sevelamer carbonate (RENVELA) 800 MG tablet Take 800 mg by mouth 3 (three) times daily with meals.    [provider]  triamcinolone cream (KENALOG) 0.1 % Apply 1 application topically 2 (two) times daily. Patient taking differently: Apply 1 application topically 2 (two) times daily as needed (itching).  11/23/19   Charlann Lange, PA-C    Discontinued Meds:  There are no discontinued medications.  Social History:  reports that he has been smoking cigarettes. He has a 20.00 pack-year smoking history. He has never used smokeless tobacco. He reports current alcohol use of about 21.0 standard drinks of alcohol per week. He reports that he does not use drugs.  Family History:   Family History  Problem Relation Age of Onset  . Diabetes Mother   . Kidney failure Mother   . Healthy Father   .  Kidney failure Brother   . Healthy Sister   . Kidney disease Daughter   . Post-traumatic stress disorder Neg Hx   . Bladder Cancer Neg Hx   . Kidney cancer Neg Hx     Blood pressure (!) 171/103, pulse 100, resp. rate 16, SpO2 100 %. General appearance: alert, cooperative and appears stated age Eyes: negative Neck: no adenopathy, no carotid bruit, supple, symmetrical, trachea midline and thyroid not enlarged, symmetric, no tenderness/mass/nodules Back: symmetric, no curvature. ROM normal. No CVA tenderness. Resp: rales bibasilar and bilaterally Cardio: regular rate and rhythm GI: distended Extremities: edema 2+ Pulses: 2+ and symmetric Skin: Skin color, texture, turgor normal. No rashes or lesions       Tanaya Dunigan, Hunt Oris, MD 04/03/2020, 6:40 PM

## 2020-04-03 NOTE — ED Notes (Signed)
Transported to hemodialysis.

## 2020-04-03 NOTE — ED Notes (Signed)
Report has been given to dialysis can come up once covid results are negative.

## 2020-04-04 ENCOUNTER — Inpatient Hospital Stay (HOSPITAL_COMMUNITY): Payer: Medicare Other

## 2020-04-04 ENCOUNTER — Other Ambulatory Visit (HOSPITAL_COMMUNITY): Payer: Medicare Other

## 2020-04-04 ENCOUNTER — Encounter (HOSPITAL_COMMUNITY): Payer: Self-pay | Admitting: Family Medicine

## 2020-04-04 DIAGNOSIS — I5021 Acute systolic (congestive) heart failure: Secondary | ICD-10-CM | POA: Diagnosis not present

## 2020-04-04 DIAGNOSIS — E875 Hyperkalemia: Secondary | ICD-10-CM

## 2020-04-04 DIAGNOSIS — D631 Anemia in chronic kidney disease: Secondary | ICD-10-CM | POA: Diagnosis not present

## 2020-04-04 DIAGNOSIS — Z20822 Contact with and (suspected) exposure to covid-19: Secondary | ICD-10-CM | POA: Diagnosis not present

## 2020-04-04 DIAGNOSIS — I517 Cardiomegaly: Secondary | ICD-10-CM | POA: Diagnosis not present

## 2020-04-04 DIAGNOSIS — R092 Respiratory arrest: Secondary | ICD-10-CM | POA: Diagnosis not present

## 2020-04-04 DIAGNOSIS — I509 Heart failure, unspecified: Secondary | ICD-10-CM | POA: Diagnosis not present

## 2020-04-04 DIAGNOSIS — D6489 Other specified anemias: Secondary | ICD-10-CM | POA: Diagnosis present

## 2020-04-04 DIAGNOSIS — E162 Hypoglycemia, unspecified: Secondary | ICD-10-CM | POA: Diagnosis not present

## 2020-04-04 DIAGNOSIS — D539 Nutritional anemia, unspecified: Secondary | ICD-10-CM | POA: Diagnosis present

## 2020-04-04 DIAGNOSIS — I953 Hypotension of hemodialysis: Secondary | ICD-10-CM | POA: Diagnosis not present

## 2020-04-04 DIAGNOSIS — R0602 Shortness of breath: Secondary | ICD-10-CM | POA: Diagnosis not present

## 2020-04-04 DIAGNOSIS — J811 Chronic pulmonary edema: Secondary | ICD-10-CM | POA: Diagnosis not present

## 2020-04-04 DIAGNOSIS — Z9115 Patient's noncompliance with renal dialysis: Secondary | ICD-10-CM | POA: Diagnosis not present

## 2020-04-04 DIAGNOSIS — J9601 Acute respiratory failure with hypoxia: Secondary | ICD-10-CM | POA: Diagnosis not present

## 2020-04-04 DIAGNOSIS — I4891 Unspecified atrial fibrillation: Secondary | ICD-10-CM | POA: Diagnosis not present

## 2020-04-04 DIAGNOSIS — R17 Unspecified jaundice: Secondary | ICD-10-CM | POA: Diagnosis present

## 2020-04-04 DIAGNOSIS — D696 Thrombocytopenia, unspecified: Secondary | ICD-10-CM | POA: Diagnosis present

## 2020-04-04 DIAGNOSIS — I132 Hypertensive heart and chronic kidney disease with heart failure and with stage 5 chronic kidney disease, or end stage renal disease: Secondary | ICD-10-CM | POA: Diagnosis not present

## 2020-04-04 DIAGNOSIS — I313 Pericardial effusion (noninflammatory): Secondary | ICD-10-CM | POA: Diagnosis present

## 2020-04-04 DIAGNOSIS — F23 Brief psychotic disorder: Secondary | ICD-10-CM | POA: Diagnosis not present

## 2020-04-04 DIAGNOSIS — N186 End stage renal disease: Secondary | ICD-10-CM | POA: Diagnosis not present

## 2020-04-04 DIAGNOSIS — F329 Major depressive disorder, single episode, unspecified: Secondary | ICD-10-CM | POA: Diagnosis present

## 2020-04-04 DIAGNOSIS — I5023 Acute on chronic systolic (congestive) heart failure: Secondary | ICD-10-CM | POA: Diagnosis present

## 2020-04-04 DIAGNOSIS — E872 Acidosis: Secondary | ICD-10-CM | POA: Diagnosis present

## 2020-04-04 DIAGNOSIS — J9811 Atelectasis: Secondary | ICD-10-CM | POA: Diagnosis not present

## 2020-04-04 DIAGNOSIS — Z992 Dependence on renal dialysis: Secondary | ICD-10-CM | POA: Diagnosis not present

## 2020-04-04 DIAGNOSIS — I120 Hypertensive chronic kidney disease with stage 5 chronic kidney disease or end stage renal disease: Secondary | ICD-10-CM | POA: Diagnosis not present

## 2020-04-04 DIAGNOSIS — R0600 Dyspnea, unspecified: Secondary | ICD-10-CM | POA: Diagnosis not present

## 2020-04-04 DIAGNOSIS — G9341 Metabolic encephalopathy: Secondary | ICD-10-CM | POA: Diagnosis not present

## 2020-04-04 DIAGNOSIS — R410 Disorientation, unspecified: Secondary | ICD-10-CM | POA: Diagnosis not present

## 2020-04-04 DIAGNOSIS — E876 Hypokalemia: Secondary | ICD-10-CM | POA: Diagnosis not present

## 2020-04-04 DIAGNOSIS — N2581 Secondary hyperparathyroidism of renal origin: Secondary | ICD-10-CM | POA: Diagnosis not present

## 2020-04-04 DIAGNOSIS — I5043 Acute on chronic combined systolic (congestive) and diastolic (congestive) heart failure: Secondary | ICD-10-CM | POA: Diagnosis not present

## 2020-04-04 DIAGNOSIS — F10239 Alcohol dependence with withdrawal, unspecified: Secondary | ICD-10-CM | POA: Diagnosis not present

## 2020-04-04 DIAGNOSIS — I10 Essential (primary) hypertension: Secondary | ICD-10-CM | POA: Diagnosis not present

## 2020-04-04 DIAGNOSIS — F17210 Nicotine dependence, cigarettes, uncomplicated: Secondary | ICD-10-CM | POA: Diagnosis present

## 2020-04-04 DIAGNOSIS — E7220 Disorder of urea cycle metabolism, unspecified: Secondary | ICD-10-CM | POA: Diagnosis present

## 2020-04-04 DIAGNOSIS — E8770 Fluid overload, unspecified: Secondary | ICD-10-CM

## 2020-04-04 LAB — CBC
HCT: 21.9 % — ABNORMAL LOW (ref 39.0–52.0)
Hemoglobin: 6.7 g/dL — CL (ref 13.0–17.0)
MCH: 32.5 pg (ref 26.0–34.0)
MCHC: 30.6 g/dL (ref 30.0–36.0)
MCV: 106.3 fL — ABNORMAL HIGH (ref 80.0–100.0)
Platelets: 127 10*3/uL — ABNORMAL LOW (ref 150–400)
RBC: 2.06 MIL/uL — ABNORMAL LOW (ref 4.22–5.81)
RDW: 19.1 % — ABNORMAL HIGH (ref 11.5–15.5)
WBC: 7.5 10*3/uL (ref 4.0–10.5)
nRBC: 0 % (ref 0.0–0.2)

## 2020-04-04 LAB — BASIC METABOLIC PANEL
Anion gap: 20 — ABNORMAL HIGH (ref 5–15)
BUN: 100 mg/dL — ABNORMAL HIGH (ref 6–20)
CO2: 20 mmol/L — ABNORMAL LOW (ref 22–32)
Calcium: 8.9 mg/dL (ref 8.9–10.3)
Chloride: 100 mmol/L (ref 98–111)
Creatinine, Ser: 12.51 mg/dL — ABNORMAL HIGH (ref 0.61–1.24)
GFR calc Af Amer: 5 mL/min — ABNORMAL LOW (ref >60)
GFR calc non Af Amer: 4 mL/min — ABNORMAL LOW (ref >60)
Glucose, Bld: 138 mg/dL — ABNORMAL HIGH (ref 70–99)
Potassium: 5.3 mmol/L — ABNORMAL HIGH (ref 3.5–5.1)
Sodium: 140 mmol/L (ref 135–145)

## 2020-04-04 LAB — RENAL FUNCTION PANEL
Albumin: 3.4 g/dL — ABNORMAL LOW (ref 3.5–5.0)
Anion gap: 19 — ABNORMAL HIGH (ref 5–15)
BUN: 95 mg/dL — ABNORMAL HIGH (ref 6–20)
CO2: 21 mmol/L — ABNORMAL LOW (ref 22–32)
Calcium: 9.4 mg/dL (ref 8.9–10.3)
Chloride: 101 mmol/L (ref 98–111)
Creatinine, Ser: 10.43 mg/dL — ABNORMAL HIGH (ref 0.61–1.24)
GFR calc Af Amer: 6 mL/min — ABNORMAL LOW (ref >60)
GFR calc non Af Amer: 5 mL/min — ABNORMAL LOW (ref >60)
Glucose, Bld: 167 mg/dL — ABNORMAL HIGH (ref 70–99)
Phosphorus: 4.4 mg/dL (ref 2.5–4.6)
Potassium: 4.2 mmol/L (ref 3.5–5.1)
Sodium: 141 mmol/L (ref 135–145)

## 2020-04-04 LAB — BLOOD GAS, ARTERIAL
Acid-Base Excess: 0.4 mmol/L (ref 0.0–2.0)
Bicarbonate: 24.3 mmol/L (ref 20.0–28.0)
Drawn by: 25266
FIO2: 40
O2 Saturation: 99.7 %
Patient temperature: 36.5
pCO2 arterial: 37.3 mmHg (ref 32.0–48.0)
pH, Arterial: 7.427 (ref 7.350–7.450)
pO2, Arterial: 150 mmHg — ABNORMAL HIGH (ref 83.0–108.0)

## 2020-04-04 LAB — PREPARE RBC (CROSSMATCH)

## 2020-04-04 IMAGING — DX DG CHEST 1V PORT
1 series · 1 of 1 positions shown · non-contrast
Comparison: [DATE]

CLINICAL DATA: Dyspnea

EXAM:
PORTABLE CHEST 1 VIEW

[chest ap]
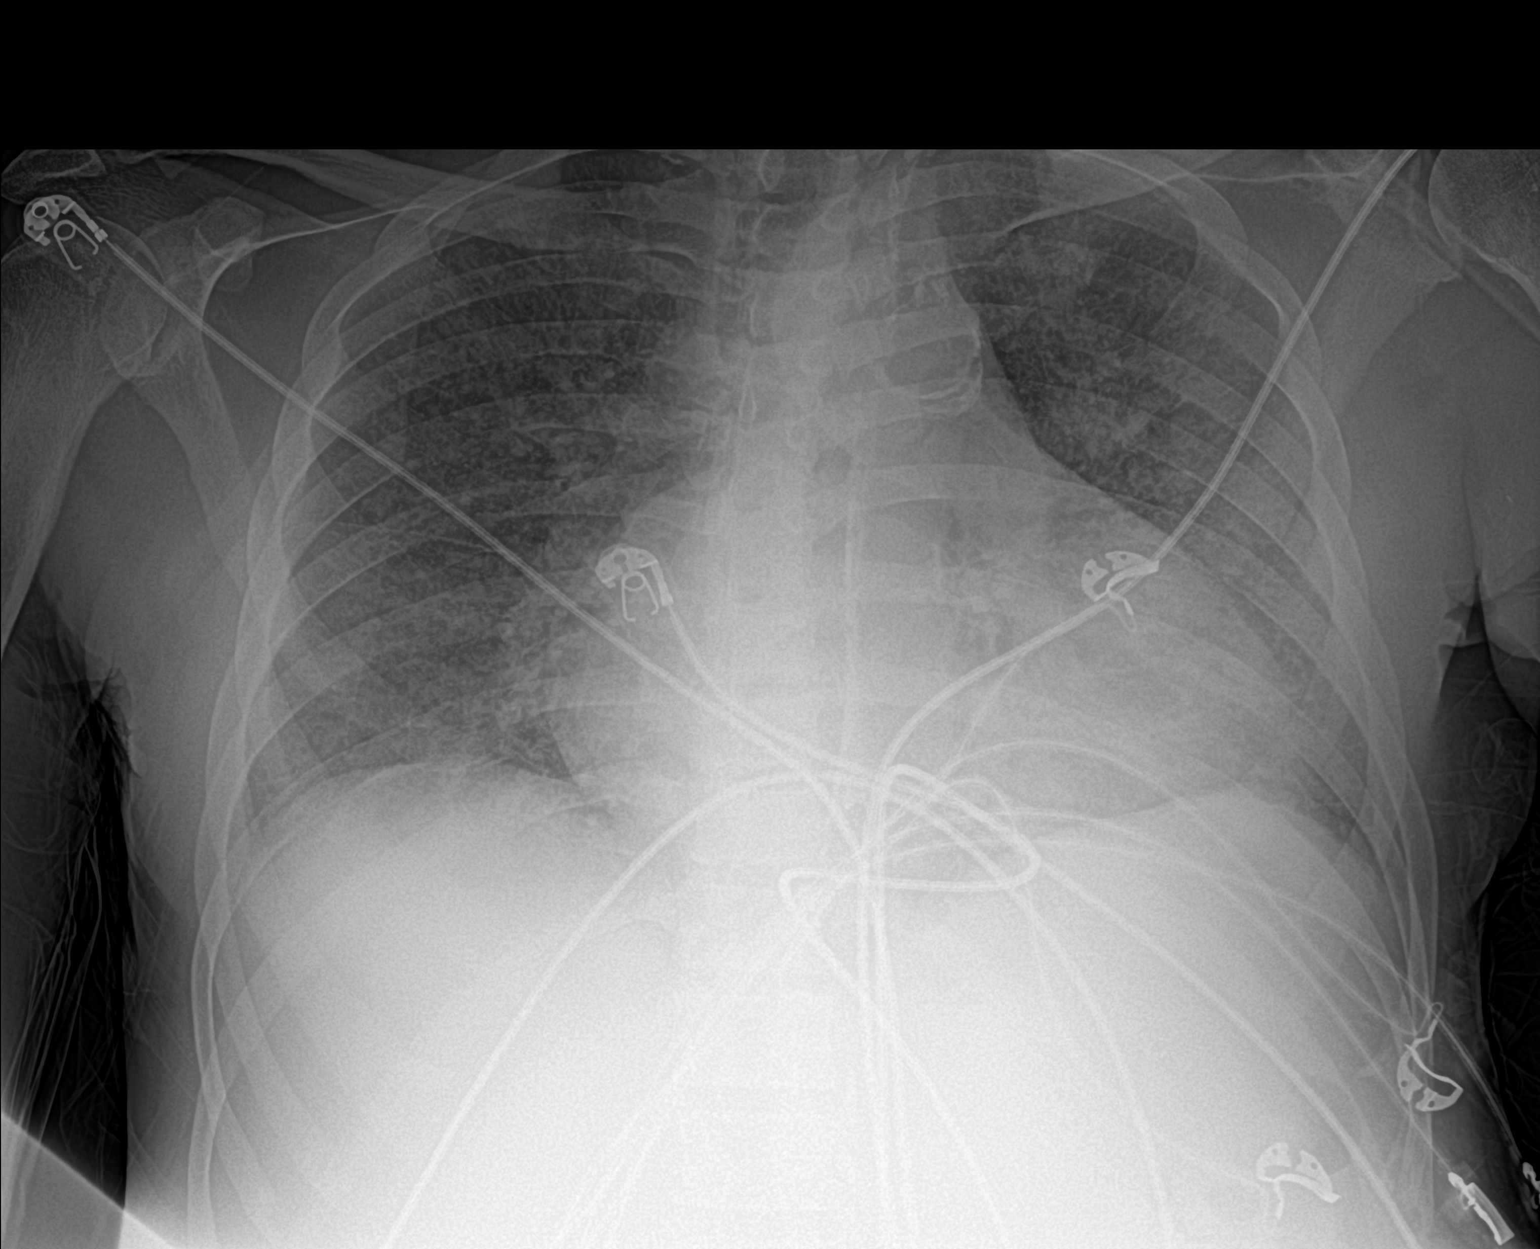

[1 of 1 positions shown; findings below may reference images not displayed]

FINDINGS: Moderate cardiomegaly. Atherosclerotic calcification of the aortic
knob. Pulmonary vascular congestion with diffuse bilateral
interstitial opacities suggesting pulmonary edema. No large pleural
fluid collection. No pneumothorax.
IMPRESSION: Cardiomegaly with pulmonary vascular congestion and diffuse
bilateral interstitial opacities suggesting pulmonary edema.

## 2020-04-04 MED ORDER — ACETAMINOPHEN 325 MG PO TABS
650.0000 mg | ORAL_TABLET | ORAL | Status: DC | PRN
  Administered 2020-04-06: 650 mg via ORAL
  Filled 2020-04-04: qty 2

## 2020-04-04 MED ORDER — DIPHENHYDRAMINE HCL 50 MG/ML IJ SOLN
12.5000 mg | Freq: Once | INTRAMUSCULAR | Status: AC
  Administered 2020-04-04: 12.5 mg via INTRAVENOUS
  Filled 2020-04-04: qty 1

## 2020-04-04 MED ORDER — HEPARIN SODIUM (PORCINE) 1000 UNIT/ML DIALYSIS
2500.0000 [IU] | INTRAMUSCULAR | Status: DC | PRN
  Filled 2020-04-04 (×2): qty 3

## 2020-04-04 MED ORDER — AMIODARONE HCL IN DEXTROSE 360-4.14 MG/200ML-% IV SOLN
60.0000 mg/h | INTRAVENOUS | Status: AC
  Administered 2020-04-04: 60 mg/h via INTRAVENOUS
  Filled 2020-04-04: qty 200

## 2020-04-04 MED ORDER — SODIUM CHLORIDE 0.9 % IV SOLN: 250.0000 mL | INTRAVENOUS | Status: DC | PRN

## 2020-04-04 MED ORDER — SODIUM CHLORIDE 0.9% FLUSH
3.0000 mL | Freq: Two times a day (BID) | INTRAVENOUS | Status: DC
  Administered 2020-04-05 – 2020-04-10 (×8): 3 mL via INTRAVENOUS

## 2020-04-04 MED ORDER — SEVELAMER CARBONATE 800 MG PO TABS
800.0000 mg | ORAL_TABLET | Freq: Three times a day (TID) | ORAL | Status: DC
  Administered 2020-04-05 – 2020-04-06 (×3): 800 mg via ORAL
  Filled 2020-04-04 (×5): qty 1

## 2020-04-04 MED ORDER — HEPARIN SODIUM (PORCINE) 1000 UNIT/ML IJ SOLN
INTRAMUSCULAR | Status: AC
  Administered 2020-04-04: 2500 [IU] via INTRAVENOUS_CENTRAL
  Filled 2020-04-04: qty 3

## 2020-04-04 MED ORDER — AMIODARONE LOAD VIA INFUSION
150.0000 mg | Freq: Once | INTRAVENOUS | Status: AC
  Administered 2020-04-04: 150 mg via INTRAVENOUS
  Filled 2020-04-04: qty 83.34

## 2020-04-04 MED ORDER — CHLORHEXIDINE GLUCONATE CLOTH 2 % EX PADS
6.0000 | MEDICATED_PAD | Freq: Every day | CUTANEOUS | Status: DC
  Administered 2020-04-04 – 2020-04-08 (×4): 6 via TOPICAL

## 2020-04-04 MED ORDER — ONDANSETRON HCL 4 MG/2ML IJ SOLN: 4.0000 mg | Freq: Four times a day (QID) | INTRAMUSCULAR | Status: DC | PRN

## 2020-04-04 MED ORDER — SODIUM CHLORIDE 0.9% IV SOLUTION: Freq: Once | INTRAVENOUS | Status: DC

## 2020-04-04 MED ORDER — DARBEPOETIN ALFA 150 MCG/0.3ML IJ SOSY
PREFILLED_SYRINGE | INTRAMUSCULAR | Status: AC
  Administered 2020-04-04: 150 ug via INTRAVENOUS
  Filled 2020-04-04: qty 0.3

## 2020-04-04 MED ORDER — DARBEPOETIN ALFA 150 MCG/0.3ML IJ SOSY
150.0000 ug | PREFILLED_SYRINGE | Freq: Once | INTRAMUSCULAR | Status: AC
  Filled 2020-04-04: qty 0.3

## 2020-04-04 MED ORDER — DIPHENHYDRAMINE HCL 25 MG PO CAPS
25.0000 mg | ORAL_CAPSULE | Freq: Three times a day (TID) | ORAL | Status: AC | PRN
  Administered 2020-04-04: 25 mg via ORAL
  Filled 2020-04-04: qty 1

## 2020-04-04 MED ORDER — SODIUM CHLORIDE 0.9% FLUSH
3.0000 mL | INTRAVENOUS | Status: DC | PRN
  Administered 2020-04-07: 3 mL via INTRAVENOUS

## 2020-04-04 MED ORDER — APIXABAN 2.5 MG PO TABS
2.5000 mg | ORAL_TABLET | Freq: Two times a day (BID) | ORAL | Status: DC
  Administered 2020-04-04 – 2020-04-06 (×3): 2.5 mg via ORAL
  Filled 2020-04-04 (×6): qty 1

## 2020-04-04 MED ORDER — PATIROMER SORBITEX CALCIUM 8.4 G PO PACK
8.4000 g | PACK | Freq: Every day | ORAL | Status: DC
  Administered 2020-04-05: 8.4 g via ORAL
  Filled 2020-04-04 (×3): qty 1

## 2020-04-04 MED ORDER — SODIUM CHLORIDE 0.9 % IV SOLN: 10.0000 mL/h | Freq: Once | INTRAVENOUS | Status: DC

## 2020-04-04 MED ORDER — AMIODARONE HCL IN DEXTROSE 360-4.14 MG/200ML-% IV SOLN
30.0000 mg/h | INTRAVENOUS | Status: DC
  Administered 2020-04-05 – 2020-04-06 (×3): 30 mg/h via INTRAVENOUS
  Filled 2020-04-04 (×3): qty 200

## 2020-04-04 MED ORDER — DILTIAZEM HCL 25 MG/5ML IV SOLN
10.0000 mg | Freq: Once | INTRAVENOUS | Status: AC
  Administered 2020-04-04: 10 mg via INTRAVENOUS
  Filled 2020-04-04: qty 5

## 2020-04-04 NOTE — Progress Notes (Signed)
  Echocardiogram 2D Echocardiogram has been attempted. Patient not in room / OTF.  Mikhala Kenan G Kaelan Amble 04/04/2020, 3:06 PM

## 2020-04-04 NOTE — ED Notes (Signed)
Pt arrived to room after dialysis, placed on 4L [place]. NAD noted, VSS on arrival. Pt denies pain at this time, no complaints or requests

## 2020-04-04 NOTE — ED Notes (Signed)
Received call back from Dr. Jonnie Finner, (nephrologist) he will come to ED to assess pt.

## 2020-04-04 NOTE — Progress Notes (Signed)
HD tx complete. Critical Hgb called-6.7. PRBC 1 unit given in dialysis. UF=3 liters. No adverse effects noted.

## 2020-04-04 NOTE — ED Notes (Signed)
Pt placed on bedpan

## 2020-04-04 NOTE — ED Notes (Signed)
Page sent to attending provider

## 2020-04-04 NOTE — ED Notes (Signed)
PT continually taking off leads. PT counseled again.

## 2020-04-04 NOTE — ED Notes (Signed)
Changed pts sheets per their request.

## 2020-04-04 NOTE — Progress Notes (Signed)
TC to ED to give report on patient. Informed that RN Charge Nurse [place] will call me back as soon as she has a space for the patient so she can obtain report.  New BP reading 151/93 P- 125 pt on NRB with 02 sat 100% on BIPAP with Respiratory and Rapid Response RN Nikki in HD unit. No patient verbal complaints.

## 2020-04-04 NOTE — ED Notes (Signed)
Dr.Schertz at bedside  

## 2020-04-04 NOTE — Consult Note (Addendum)
Cardiology Consultation:   Patient ID: ADRYEL WORTMANN MRN: [id number]; DOB: 1966/01/08  Admit date: 04/03/2020 Date of Consult: 04/04/2020  Primary Care Provider: Jearld Fenton, NP [facility] Cardiologist: New to [place] Electrophysiologist:  None    Patient Profile:   YUL DIANA is a 54 y.o. male with a hx of ESRD noncompliant with HD, anemia of chronic kidney disease, recurrent hyperkalemia, secondary hyperparathyroidism, history of EtOH and tobacco abuse who is being seen today for the evaluation of new onset atrial fibrillation at the request of Dr. Alfredia Ferguson.  History of Present Illness:   Mr. Daft is a 54 year old male with past medical history of ESRD noncompliant with HD, anemia of chronic kidney disease, recurrent hyperkalemia, secondary hyperparathyroidism, history of EtOH and tobacco abuse.  He has frequent admissions due to volume overload after missing dialysis.  Since the beginning of May until now, this is 6th admission.  Majority of previous admission for heart failure is related to missed dialysis sessions.  Echocardiogram performed on 01/24/2020 showed EF 45 to 13%, grade 3 diastolic dysfunction, severe left atrial dilatation and right atrial dilatation, mild MR. He has received multiple transfusions in the past due to worsening anemia. (1 unit PRBC 1/9, 1 unit 1/30, 2 units 2/20, 3 units 5/31, 1 unit 6/30, 1 unit 7/14)  Patient was transported to [facility] ED last night after found on a home welfare check to have significant shortness of breath.  He is a very poor historian and likely has missed recent dialysis session.  On arrival, his potassium was greater than 7.5.  Creatinine 22.03.  Hemoglobin 7.1.  Subsequent hemoglobin dropped down to 6.4 and the patient was transfused with 1 unit of packed red blood cell.  Initial EKG on arrival shows he was in sinus rhythm at the time. Chest x-ray shows clear pulmonary edema. He had 2+ pitting edema on exam.  He  was admitted for hyperkalemia and volume overload in the setting of missed dialysis session. He was taken to dialysis for volume removal, dialysis will discuss short due to onset of atrial fibrillation and hypotension.  The current plan is for repeat dialysis today.  Cardiology has been consulted for new atrial fibrillation, heart rate overnight was in the 100-130s range.  He was given a dose of IV diltiazem and placed on 2.5 mg twice a day of Eliquis.   Past Medical History:  Diagnosis Date   Anemia    Aortic atherosclerosis ([place]) 11/12/2019   CKD (chronic kidney disease)    Stage 5  Dialysis - M/W/F in [place], [place]   Dyspnea    tx with inhaler when sick   ED (erectile dysfunction)    Emphysema of lung ([place]) 11/12/2019   ETOH abuse    History of blood transfusion    Hypertension    Wears dentures     Past Surgical History:  Procedure Laterality Date   BASCILIC VEIN TRANSPOSITION Left 08/07/2016   Procedure: LEFT BASILIC VEIN TRANSPOSITION;  Surgeon: Angelia Mould, MD;  Location: [place];  Service: Vascular;  Laterality: Left;   CLOSED REDUCTION NASAL FRACTURE N/A 08/11/2019   Procedure: CLOSED REDUCTION NASAL FRACTURE WITH STABILIZATION;  Surgeon: Irene Limbo, MD;  Location: [place];  Service: Plastics;  Laterality: N/A;   COLONOSCOPY N/A 08/12/2016   Procedure: COLONOSCOPY;  Surgeon: Otis Brace, MD;  Location: [place];  Service: Gastroenterology;  Laterality: N/A;   ESOPHAGOGASTRODUODENOSCOPY N/A 08/12/2016   Procedure: ESOPHAGOGASTRODUODENOSCOPY (EGD);  Surgeon: Otis Brace, MD;  [facility];  Service: Gastroenterology;  Laterality: N/A;   INSERTION OF DIALYSIS CATHETER N/A 08/07/2016   Procedure: INSERTION OF TUNNELED DIALYSIS CATHETER;  Surgeon: Angelia Mould, MD;  Location: [place];  Service: Vascular;  Laterality: N/A;   LIGATION OF ARTERIOVENOUS  FISTULA Left 09/12/2016   Procedure: BANDING OF LEFT  ARTERIOVENOUS  FISTULA;  Surgeon:  Angelia Mould, MD;  Location: [place];  Service: Vascular;  Laterality: Left;   LOWER EXTREMITY INTERVENTION Right 12/02/2018   Procedure: LOWER EXTREMITY INTERVENTION;  Surgeon: Algernon Huxley, MD;  Location: [place] [facility];  Service: Cardiovascular;  Laterality: Right;     Home Medications:  Prior to Admission medications   Medication Sig Start Date End Date Taking? Authorizing Provider  calcium acetate (PHOSLO) 667 MG capsule Take 2 capsules (1,334 mg total) by mouth 3 (three) times daily with meals. Patient taking differently: Take 2,001 mg by mouth 3 (three) times daily with meals.  02/12/19  Yes Fritzi Mandes, MD  camphor-menthol [facility]) lotion Apply 1 application topically as needed for itching. 11/23/19  Yes Charlann Lange, PA-C  patiromer (VELTASSA) 8.4 g packet Take 1 packet (8.4 g total) by mouth daily. 03/14/20  Yes Lorella Nimrod, MD  sevelamer carbonate (RENVELA) 800 MG tablet Take 800 mg by mouth 3 (three) times daily with meals.   Yes [provider]  triamcinolone cream (KENALOG) 0.1 % Apply 1 application topically 2 (two) times daily. Patient taking differently: Apply 1 application topically 2 (two) times daily as needed (itching).  11/23/19  Yes Charlann Lange, PA-C    Inpatient Medications: Scheduled Meds:  sodium chloride   Intravenous Once   apixaban  2.5 mg Oral BID   Chlorhexidine Gluconate Cloth  6 each Topical Q0600   Chlorhexidine Gluconate Cloth  6 each Topical Q0600   darbepoetin (ARANESP) injection - DIALYSIS  150 mcg Intravenous Once   patiromer  8.4 g Oral Daily   sevelamer carbonate  800 mg Oral TID WC   sodium chloride flush  3 mL Intravenous Q12H   Continuous Infusions:  sodium chloride     sodium chloride     sodium chloride     sodium chloride     PRN Meds: sodium chloride, sodium chloride, sodium chloride, acetaminophen, alteplase, heparin, lidocaine (PF), lidocaine-prilocaine, ondansetron (ZOFRAN) IV, pentafluoroprop-tetrafluoroeth,  sodium chloride flush  Allergies:   No Known Allergies  Social History:   Social History   Socioeconomic History   Marital status: Single    Spouse name: Not on file   Number of children: Not on file   Years of education: Not on file   Highest education level: Not on file  Occupational History   Not on file  Tobacco Use   Smoking status: Current Every Day Smoker    Packs/day: 0.50    Years: 40.00    Pack years: 20.00    Types: Cigarettes   Smokeless tobacco: Never Used  Vaping Use   Vaping Use: Never used  Substance and Sexual Activity   Alcohol use: Yes    Alcohol/week: 21.0 standard drinks    Types: 21 Cans of beer per week    Comment: none since 08/04/16   Drug use: No   Sexual activity: Not Currently  Other Topics Concern   Not on file  Social History Narrative   Not on file   Social Determinants of Health   Financial Resource Strain:    Difficulty of Paying Living Expenses:   Food Insecurity:  Worried About Charity fundraiser in the Last Year:    Arboriculturist in the Last Year:   Transportation Needs:    Film/video editor (Medical):    Lack of Transportation (Non-Medical):   Physical Activity:    Days of Exercise per Week:    Minutes of Exercise per Session:   Stress:    Feeling of Stress :   Social Connections:    Frequency of Communication with Friends and Family:    Frequency of Social Gatherings with Friends and Family:    Attends Religious Services:    Active Member of Clubs or Organizations:    Attends Music therapist:    Marital Status:   Intimate Partner Violence:    Fear of Current or Ex-Partner:    Emotionally Abused:    Physically Abused:    Sexually Abused:     Family History:    Family History  Problem Relation Age of Onset   Diabetes Mother    Kidney failure Mother    Healthy Father    Kidney failure Brother    Healthy Sister    Kidney disease Daughter    Post-traumatic stress disorder Neg Hx     Bladder Cancer Neg Hx    Kidney cancer Neg Hx      ROS:  Please see the history of present illness.   All other ROS reviewed and negative.     Physical Exam/Data:   Vitals:   04/04/20 1146 04/04/20 1201 04/04/20 1216 04/04/20 1231  BP: (!) 149/85 137/88 138/88 (!) 145/93  Pulse: 86 87 91 86  Resp: 16 17 16 11   Temp:      TempSrc:      SpO2: 100% 100% 100% 100%  Weight:        Intake/Output Summary (Last 24 hours) at 04/04/2020 1301 Last data filed at 04/04/2020 0931 Gross per 24 hour  Intake 300 ml  Output 1461 ml  Net -1161 ml   Last 3 Weights 04/03/2020 03/22/2020 03/21/2020  Weight (lbs) 158 lb 11.7 oz 158 lb 11.7 oz 169 lb 5 oz  Weight (kg) 72 kg 72 kg 76.8 kg     Body mass index is 27.25 kg/m.  General:  Drowsy, on bipap, did not answer any question or follow command, able to open eye with stimuli HEENT: normal Lymph: no adenopathy Neck: no JVD Endocrine:  No thryomegaly Vascular: No carotid bruits; FA pulses 2+ bilaterally without bruits  Cardiac:  normal S1, S2; RRR; 2/6 murmur Lungs:  Anterior exam bilateral rhonchi Abd: soft, nontender, no hepatomegaly  Ext: 2+ edema Musculoskeletal:  No deformities, BUE and BLE strength normal and equal Skin: warm and dry  Neuro:  Unable to assess, patient on BIPAP and did not follow command Psych:  Unable to assess   EKG:  The EKG was personally reviewed and demonstrates:  Atrial fibrillation with RVR, rare PVC, no significant ST-T wave changes Telemetry:  Telemetry was personally reviewed and demonstrates:  Patient was initially in atrial fibrillation with RVR from 1AM until 11:34AM this morning, at 11:34AM, he converted to normal sinus rhythm with HR 80s  Relevant CV Studies:  Echo 01/24/2020  1. Left ventricular ejection fraction, by estimation, is 45 to 50%. The  left ventricle has mildly decreased function. The left ventricle  demonstrates global hypokinesis. There is moderate left ventricular  hypertrophy. Left  ventricular diastolic  parameters are consistent with Grade III diastolic dysfunction  (restrictive). Elevated left ventricular end-diastolic pressure.  2. Right ventricular systolic function is normal. The right ventricular  size is normal. There is severely elevated pulmonary artery systolic  pressure.   3. Left atrial size was severely dilated.   4. Right atrial size was severely dilated.   5. The mitral valve is grossly normal. Mild mitral valve regurgitation.  No evidence of mitral stenosis.   6. Tricuspid valve regurgitation is moderate.   7. The aortic valve is tricuspid. Aortic valve regurgitation is not  visualized. Mild aortic valve sclerosis is present, with no evidence of  aortic valve stenosis.   8. The inferior vena cava is dilated in size with <50% respiratory  variability, suggesting right atrial pressure of 15 mmHg.   Laboratory Data:  High Sensitivity Troponin:   Recent Labs  Lab 03/21/20 0315 03/21/20 0720  TROPONINIHS 322* 289*     Chemistry Recent Labs  Lab 04/03/20 1710 04/03/20 1710 04/03/20 1737 04/03/20 2230 04/04/20 0500  NA 140   < > 137 141 140  K >7.5*   < > 8.2* 4.2 5.3*  CL 102   < > 109 101 100  CO2 12*  --   --  21* 20*  GLUCOSE 94   < > 89 167* 138*  BUN 206*   < > >130* 95* 100*  CREATININE 22.03*   < > >18.00* 10.43* 12.51*  CALCIUM 9.7  --   --  9.4 8.9  GFRNONAA 2*  --   --  5* 4*  GFRAA 2*  --   --  6* 5*  ANIONGAP 26*  --   --  19* 20*   < > = values in this interval not displayed.    Recent Labs  Lab 04/03/20 2230  ALBUMIN 3.4*   Hematology Recent Labs  Lab 04/03/20 1710 04/03/20 1737 04/03/20 2230  WBC 7.1  --  7.1  RBC 2.15*  --  1.92*  HGB 7.1* 7.8* 6.4*  HCT 25.0* 23.0* 20.3*  MCV 116.3*  --  105.7*  MCH 33.0  --  33.3  MCHC 28.4*  --  31.5  RDW 18.6*  --  18.2*  PLT 165  --  135*   BNPNo results for input(s): BNP, PROBNP in the last 168 hours.  DDimer No results for input(s): DDIMER in the last 168  hours.   Radiology/Studies:  DG Chest Port 1 View  Result Date: 04/03/2020 CLINICAL DATA:  Hypoxemia, short of breath, end-stage renal disease EXAM: PORTABLE CHEST 1 VIEW COMPARISON:  03/21/2020 FINDINGS: Single frontal view of the chest demonstrates an enlarged cardiac silhouette. Diffuse interstitial prominence, ground-glass airspace disease, and vascular congestion compatible with fluid overload. No effusion or pneumothorax. No acute bony abnormalities. IMPRESSION: 1. Pulmonary edema. Electronically Signed   By: Randa Ngo M.D.   On: 04/03/2020 16:51     Assessment and Plan:   Newly diagnosed atrial fibrillation with RVR: On arrival to the ED yesterday afternoon, he was in normal sinus rhythm however developed new atrial fibrillation during dialysis last night.  This was accompanied by hypotension which stopped the dialysis session early. He converted out of atrial fibrillation around 11:34 AM this morning.  -We will discuss with MD, consider start on IV amiodarone therapy.  Although he carries a diagnosis of anemia of chronic kidney disease, however patient has had at least 6 blood transfusions this year alone, he would be a high risk patient to start on Eliquis.  I think the risk of bleeding outweighs the potential benefit. His noncompliance is  also prohibitive as well.  -This patients CHA2DS2-VASc Score and unadjusted Ischemic Stroke Rate (% per year) is equal to 2.2 % stroke rate/year from a score of 2  Above score calculated as 1 point each if present [CHF, HTN, DM, Vascular=MI/PAD/Aortic Plaque, Age if 65-74, or Male] Above score calculated as 2 points each if present [Age > 75, or Stroke/TIA/TE]  Acute respiratory failure: He is currently on BiPAP, I am unable to obtain any history from the patient and he does not follow command.  He will need another dialysis session today.  Acute on chronic combined systolic and diastolic heart failure  -Previous echocardiogram obtained in May  2021 showed EF 45 to 50% which was borderline low.  Cardiology service was not consulted at the time.  Patient is only able to tell me if he has had recent chest discomfort.  Given his history of noncompliance, he will be a poor candidate for any invasive study.  Hyperkalemia: Improved after dialysis  Anemia of chronic kidney disease: requires multiple blood transfusions this year  Altered mental status: Patient appears very somnolent during the interview and was unable to provide any history.  Noncompliance      For questions or updates, please contact [place] Please consult [email or web address] for contact info under    Signed, Almyra Deforest, [place]  04/04/2020 1:01 PM  Patient examined chart reviewed Discussed care with PA. Exam with obtunded black male Fistula in LUE. JVP elevated basilar rales Abdomen soft Flow murmur SEM LLSB Plus one edema. Telemetry review now with conversion to NSR. 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 Not likely to take PO meds reliably now Load with iv amiodarone to help maintain NSR in hopes of facilitating at least more inpatient dialysis as he has been hypotensive . Primary service has started low dose Eliquis. Although CHADVASC is 2 I suspect he is not a candidate for chronic long term anticoagulation given his non compliance with medical Rx and dialysis Would repeat echo as he has significant CE on CXR and has been non compliant with dialysis and with low BP would like to r/o pericardial effusion / uremic   Jenkins Rouge MD [facility]

## 2020-04-04 NOTE — ED Notes (Signed)
Per blood bank the unit of blood will have to be turned off at 0925, 4 hours after the unit was issued. At the current rate the blood is transfusing there unit will not finish. Will page admitting to notify.

## 2020-04-04 NOTE — ED Notes (Addendum)
Pt wanted to eat, took BiPap off himself and ate. He will not put the BiPap back on until he is finished eating. RT aware.

## 2020-04-04 NOTE — ED Notes (Signed)
PT increasingly agitated r/t not getting a shower or bed. PT educated about the process of getting the bed. PT began throwing items in room and threatening nurse. PT counseled about behavior.

## 2020-04-04 NOTE — Progress Notes (Signed)
Care started prior to midnight in the emergency room and patient was admitted early this morning after midnight and I am in current agreement with assessment and plan done by Dr. Harrold Donath.  Additional changes to the plan of care been made accordingly.  The patient is a 54 year old overweight African-American male with a past medical history significant for but not limited to ESRD on hemodialysis, anemia, history of emphysema of the lung, alcohol abuse, hypertension, aortic atherosclerosis as well as other comorbidities presented to the emergency room with shortness of breath after he had missed several dialysis sessions.  He states that he did not want to go and he has had noncompliance with hemodialysis in the past.  At home he had a welfare check and he was found to have shortness of breath and EMS was contacted and he was brought to the hospital.  He denies any fevers or chills and any chest pain but he states that his heart does race quickly sometimes.  He does have a history of volume overload and he states that his appetite has been normal but he is hungry and is not a very good historian.  In the emergency room is found to have volume overload with bilateral rales and leg edema and had increased BUN and creatinine from his baseline as well as hyperkalemia.  He was treated with calcium gluconate, insulin and D50 in the emergency room and underwent urgent dialysis but there were limited to how much fluid they could drop due to his hypotension and he was sent back to the emergency room.  He continued to have shortness of breath and found to be A. fib with RVR and he was placed on BiPAP.  Hospitalist admitted nephrology is planning to dialyze again today and because of his continued hypotension and A. fib with RVR cardiology was consulted for volume overload as well as assistance with heart rate management. Currently he is being admitted for and treated for the following but not limited too:  Acute on  Chronic Combined Systolic and Grade 3 Diastolic CHF with an EF of 45 to 50% -In the setting of volume overload from missed dialysis sessions -The being admitted to cardiac telemetry -Continue with dialysis per nephrology recommendations -strict I's and O's and daily weights -His A. fib with RVR could be contributing so cardiology been consulted for assistance with heart rate management -Continue with volume management with dialysis and appreciate further nephrology and cardiology recommendations  Acute respiratory failure with hypoxia in the setting of volume overload from acute on chronic combined systolic CHF from missed dialysis -Requiring BiPAP when I saw him and had some leg edema crackles -Continue supplemental oxygen via nasal cannula as well as BiPAP as needed -Wean O2 to room air  -Continuous pulse oximetry maintain O2 saturations greater than 90% -SpO2: 97 % O2 Flow Rate (L/min): 5 L/min FiO2 (%): 40 % -Repeat chest x-ray in the a.m. and will need an ambulatory home O2 screen prior to discharge  ESRD on Dialysis [facility]) Metabolic acidosis -Chronic with poor compliance with dialysis. -Patient's BUN/Cr on Admission was than 130/greater than 18.00 and after dialysis it is now 100/12.7 -Patient's metabolic acidosis is improving and initially his CO2 was 12, anion gap was 26, chloride level was 109 -Repeat CO2 is now 20, anion gap is 20, and chloride level is 100 -Avoid nephrotoxic medications, contrast dyes, hypotension and renally adjust medications and further deferment of dialysis to nephrology Patient is likely to be dialyzed again today  Anemia in ESRD (end-stage renal disease) (HCC)/Macrocytic Anemia -Patient with hemoglobin of 6.4 and a hematocrit of 20.3.   -Transfuse 1 unit of packed red blood cells however could not be complete as patient became volume overloaded and more dyspneic  -Patient will need to be dialyzed before further units can be provided to prevent volume  overload. -I spoke with nephrology and they will evaluate to see if he needs another unit of PRBCs in the dialysis session  -Continue to monitor for signs and symptoms of bleeding; currently no overt bleeding noted For repeat CBC in a.m.  Fluid Overload  -secondary to missed dialysis sessions -Appreciate volume management per nephrology recommendations  Essential Hypertension -Patient with hypotension during dialysis and will hold antihypertensives for now. -Continues to be hypotensive and remains on a Cardizem drip next-we will defer to cardiology for further adjustment  Hyperkalemia  -Patient initially with hyperkalemia potassium of 8.2.  After dialysis and treatment in ER potassium was 4.2 and repeat this morning was 5.8 next-patient likely to be dialyzed again and further management per nephrology  Non-compliance with Renal Dialysis [facility]) -Patient with chronic noncompliance.   -Will need social work consult to assist with educating patient on importance of dialysis and helping to arrange for any mentions that may prevent future noncompliance  Atrial Fibrillation with RVR ([place]) -Patient given Cardizem 10 mg IV once and will monitor heart rhythm and rate closely. -Patient started on Eliquis 2.5 mg p.o. twice daily -Have consulted Cardiology for further assistance in management given his uncontrolled heart rates, hypotension and continued volume overload  Thrombocytopenia -Patient's platelet count from 165 is now 135  -Continue to monitor for signs and symptoms of bleeding; currently no overt bleeding or -Repeat CBC in the a.m.  We will continue to monitor patient's clinical response to intervention repeat blood work and imaging in the a.m. and follow-up with nephrology as well as cardiology recommendations

## 2020-04-04 NOTE — ED Provider Notes (Signed)
Patient returns following dialysis, continues to be dyspneic at rest.  Oxygen saturation 90-92% with supplemental nasal oxygen.  Lungs have rales diffusely.  Discussed with nephrologist, Dr. Augustin Coupe, who states that dialysis had to be terminated because of hypotension and will need to have additional dialysis tomorrow for fluid overload.  Of note, post dialysis blood tests show hemoglobin has dropped to 6.4, and potassium has dropped to 4.2.  He will be given a blood transfusion.  Case is discussed with Dr. Corinna Capra of [facility] hospitalist, who agrees to admit the patient.   Delora Fuel, MD 21/82/88 [phone]

## 2020-04-04 NOTE — ED Notes (Signed)
Breakfast Ordered--Jordan [name]  

## 2020-04-04 NOTE — ED Notes (Signed)
Per admitting provider, keep blood unit at 75 ml/hr, turn off per protocol. Pt can have more blood during dialysis, not right now due to high risk of volume overload.

## 2020-04-04 NOTE — Progress Notes (Signed)
[facility] Progress Note  Subjective: pt had HD overnight and K+ better, 1.6 L removed and pt back on bipap d/t struggling to breath. Repeat CXR not reported yest.   Vitals:   04/04/20 0745 04/04/20 0925 04/04/20 0926 04/04/20 1121  BP: (!) 128/95 104/73 104/73   Pulse: (!) 144 (!) 110  (!) 101  Resp: (!) 21 17 (!) 25 20  Temp:  98.8 F (37.1 C)    TempSrc:  Axillary    SpO2: 100% 98%  97%  Weight:        Exam:  in bipap  responsive   +JVD  bilat rales 1/2 up post  Cor reg    Abd soft ntnd no ascites   Ext 2+ pitting pretib / pedal edema    OP HD: MWF [facility]  4h  68kg  Leaving at 71kg most rx's   L arm BCF  mircera 225 last 5.7  calc 1 ug tiw      Assessment/ Plan: 1. SOB/ resp distress - on bipap despite HD overnight. Plan another HD this afternoon asap, max UF as tolerated.  2. Hyperkalemia - K > 7.5 on admit, improved today but still high. Rx w/ hd.  3. ESRD - on HD MWF.  HD today off schedule.  4. HTN - cont acei/ hydralazine 5. 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     Kelly Splinter 04/04/2020, 11:36 AM   Recent Labs  Lab 04/03/20 1737 04/03/20 1737 04/03/20 2230 04/04/20 0500  K 8.2*   < > 4.2 5.3*  BUN >130*   < > 95* 100*  CREATININE >18.00*   < > 10.43* 12.51*  CALCIUM  --    < > 9.4 8.9  PHOS  --   --  4.4  --   HGB 7.8*  --  6.4*  --    < > = values in this interval not displayed.   Inpatient medications: . apixaban  2.5 mg Oral BID  . Chlorhexidine Gluconate Cloth  6 each Topical Q0600  . darbepoetin (ARANESP) injection - DIALYSIS  150 mcg Intravenous Once  . patiromer  8.4 g Oral Daily  . sevelamer carbonate  800 mg Oral TID WC  . sodium chloride flush  3 mL Intravenous Q12H   . sodium chloride    . sodium chloride    . sodium chloride    . sodium chloride     sodium chloride, sodium chloride, sodium chloride, acetaminophen, alteplase, heparin, lidocaine (PF),  lidocaine-prilocaine, ondansetron (ZOFRAN) IV, pentafluoroprop-tetrafluoroeth, sodium chloride flush

## 2020-04-04 NOTE — ED Notes (Signed)
PT taking of leads. PT counseled about leaving leads on.

## 2020-04-04 NOTE — H&P (Signed)
History and Physical    Jordan [name] LZJ:[id number] DOB: 30-May-1966 DOA: 04/03/2020  PCP: Jearld Fenton, NP   Patient coming from:   Home  Chief Complaint: SOB.   HPI: Jordan [name] is a 54 y.o. male with medical history significant for ESRD on HD who has missed sereral dialysis sessions. He states he just did not want to go. He has had noncompliance with HD in the past. He was found on a home welfare check to have SOB and EMS was contacted and pt brought to hospital.  He denies having any fever or chills.  He denies any chest pain but states sometimes his heart does race quickly.  He has a history of fluid overload.  He denies any cough, abdominal pain, nausea vomiting or diarrhea.  States his appetite has been normal and he is hungry now.  Patient is not a good historian.  ED Course: In the emergency room patient is found to be fluid overloaded with bilateral rails and edema of legs.  He is also found to have increase in his BUN and creatinine and hyperkalemia.  He was treated with calcium gluconate, insulin and D50 in the emergency room.  He underwent dialysis by nephrology but they were limited on how much fluid they could draw off secondary to hypotension and he was sent back to the emergency room.  Patient continued to have some shortness of breath and was found to be in A. fib with RVR.  Hospitalist service was asked to admit for further management.  Nephrology plans to dialyze again in the morning  Review of Systems:  General: Reports generalized weakness.denies fever, chills, weight loss, night sweats.  Denies dizziness.  Denies change in appetite HENT: Denies head trauma, headache, denies change in hearing, tinnitus.  Denies nasal congestion or bleeding.  Denies sore throat, sores in mouth.  Denies difficulty swallowing Eyes: Denies blurry vision, pain in eye, drainage.  Denies discoloration of eyes. Neck: Denies pain.  Denies swelling.  Denies pain with  movement. Cardiovascular: Denies chest pain.ports having palpitations.  Reports peripheral edema.  Denies orthopnea Respiratory: Ports shortness of breath.  Denies cough.  Denies wheezing.  Denies sputum production Gastrointestinal: Denies abdominal pain, swelling.  Denies nausea, vomiting, diarrhea.  Denies melena.  Denies hematemesis. Musculoskeletal: Denies limitation of movement.  Denies deformity or swelling.  Denies pain.  Denies arthralgias or myalgias. Genitourinary: Denies pelvic pain.  Denies urinary frequency or hesitancy.  Denies dysuria.  Skin: Denies rash.  Denies petechiae, purpura, ecchymosis. Neurological: Denies headache.  Denies syncope.  Denies seizure activity.  Denies weakness or paresthesia.  Denies slurred speech, drooping face.  Denies visual change. Psychiatric: Denies depression, anxiety.  Denies suicidal thoughts or ideation.  Denies hallucinations.  Past Medical History:  Diagnosis Date  . Anemia   . Aortic atherosclerosis ([place]) 11/12/2019  . CKD (chronic kidney disease)    Stage 5  Dialysis - M/W/F in [place], [place]  . Dyspnea    tx with inhaler when sick  . ED (erectile dysfunction)   . Emphysema of lung ([facility]) 11/12/2019  . ETOH abuse   . History of blood transfusion   . Hypertension   . Wears dentures     Past Surgical History:  Procedure Laterality Date  . BASCILIC VEIN TRANSPOSITION Left 08/07/2016   Procedure: LEFT BASILIC VEIN TRANSPOSITION;  Surgeon: Angelia Mould, MD;  Location: [place];  Service: Vascular;  Laterality: Left;  . CLOSED REDUCTION NASAL FRACTURE N/A 08/11/2019  Procedure: CLOSED REDUCTION NASAL FRACTURE WITH STABILIZATION;  Surgeon: Irene Limbo, MD;  Location: [place];  Service: Plastics;  Laterality: N/A;  . COLONOSCOPY N/A 08/12/2016   Procedure: COLONOSCOPY;  Surgeon: Otis Brace, MD;  Location: [place];  Service: Gastroenterology;  Laterality: N/A;  . ESOPHAGOGASTRODUODENOSCOPY N/A 08/12/2016   Procedure:  ESOPHAGOGASTRODUODENOSCOPY (EGD);  Surgeon: Otis Brace, MD;  Location: [place];  Service: Gastroenterology;  Laterality: N/A;  . INSERTION OF DIALYSIS CATHETER N/A 08/07/2016   Procedure: INSERTION OF TUNNELED DIALYSIS CATHETER;  Surgeon: Angelia Mould, MD;  Location: [place];  Service: Vascular;  Laterality: N/A;  . LIGATION OF ARTERIOVENOUS  FISTULA Left 09/12/2016   Procedure: BANDING OF LEFT  ARTERIOVENOUS  FISTULA;  Surgeon: Angelia Mould, MD;  Location: [place];  Service: Vascular;  Laterality: Left;  . LOWER EXTREMITY INTERVENTION Right 12/02/2018   Procedure: LOWER EXTREMITY INTERVENTION;  Surgeon: Algernon Huxley, MD;  Location: [place] [facility];  Service: Cardiovascular;  Laterality: Right;    Social History  reports that he has been smoking cigarettes. He has a 20.00 pack-year smoking history. He has never used smokeless tobacco. He reports current alcohol use of about 21.0 standard drinks of alcohol per week. He reports that he does not use drugs.  No Known Allergies  Family History  Problem Relation Age of Onset  . Diabetes Mother   . Kidney failure Mother   . Healthy Father   . Kidney failure Brother   . Healthy Sister   . Kidney disease Daughter   . Post-traumatic stress disorder Neg Hx   . Bladder Cancer Neg Hx   . Kidney cancer Neg Hx      Prior to Admission medications   Medication Sig Start Date End Date Taking? Authorizing Provider  calcium acetate (PHOSLO) 667 MG capsule Take 2 capsules (1,334 mg total) by mouth 3 (three) times daily with meals. Patient taking differently: Take 2,001 mg by mouth 3 (three) times daily with meals.  02/12/19  Yes Fritzi Mandes, MD  camphor-menthol [facility]) lotion Apply 1 application topically as needed for itching. 11/23/19  Yes Charlann Lange, PA-C  patiromer (VELTASSA) 8.4 g packet Take 1 packet (8.4 g total) by mouth daily. 03/14/20  Yes Lorella Nimrod, MD  sevelamer carbonate (RENVELA) 800 MG tablet Take 800 mg  by mouth 3 (three) times daily with meals.   Yes [provider]  triamcinolone cream (KENALOG) 0.1 % Apply 1 application topically 2 (two) times daily. Patient taking differently: Apply 1 application topically 2 (two) times daily as needed (itching).  11/23/19  Yes Charlann Lange, PA-C    Physical Exam: Vitals:   04/04/20 0050 04/04/20 0125 04/04/20 0145 04/04/20 0230  BP: (!) 119/92 (!) 140/99 (!) 138/97 (!) 129/95  Pulse: 98 (!) 28 90 (!) 119  Resp: 18 (!) 23 (!) 21 (!) 21  Temp:  98.3 F (36.8 C)    TempSrc:  Oral    SpO2:  96% 95% 94%  Weight:        Constitutional: NAD, calm, comfortable Vitals:   04/04/20 0050 04/04/20 0125 04/04/20 0145 04/04/20 0230  BP: (!) 119/92 (!) 140/99 (!) 138/97 (!) 129/95  Pulse: 98 (!) 28 90 (!) 119  Resp: 18 (!) 23 (!) 21 (!) 21  Temp:  98.3 F (36.8 C)    TempSrc:  Oral    SpO2:  96% 95% 94%  Weight:       General: WDWN, Alert and oriented x3.  Eyes: EOMI, PERRL,  lids and conjunctivae normal.  Sclera nonicteric HENT:  [place]/AT, external ears normal.  Nares patent without epistasis.  Mucous membranes are moist. Posterior pharynx clear of any exudate or lesions. Normal dentition.  Neck: Soft, normal range of motion, supple, no masses, no thyromegaly.  Trachea midline Respiratory: clear to auscultation bilaterally, no wheezing, no crackles. Normal respiratory effort. No accessory muscle use.  Cardiovascular: Irregularly irregular rhythm with tachycardia.  No murmurs / rubs / gallops.  +2 lower extremity edema. 1+ pedal pulses. No carotid bruits.  Mild JVD. Abdomen: Soft, no tenderness, nondistended, no rebound or guarding.  No masses palpated. Bowel sounds normoactive Musculoskeletal: FROM. no clubbing / cyanosis. No joint deformity upper and lower extremities. Normal muscle tone.  Skin: Warm, dry, intact no rashes, lesions, ulcers. No induration Neurologic: CN 2-12 grossly intact.  Normal speech.  Sensation intact, patella DTR +1  bilaterally. Strength 5/5 in all extremities.  Moves all 4 extremities spontaneously. Psychiatric: Poor insight.  Normal mood.   CHAD-VAS score- 3  Labs on Admission: I have personally reviewed following labs and imaging studies  CBC: Recent Labs  Lab 04/03/20 1710 04/03/20 1737 04/03/20 2230  WBC 7.1  --  7.1  NEUTROABS 6.0  --   --   HGB 7.1* 7.8* 6.4*  HCT 25.0* 23.0* 20.3*  MCV 116.3*  --  105.7*  PLT 165  --  135*    Basic Metabolic Panel: Recent Labs  Lab 04/03/20 1710 04/03/20 1737 04/03/20 2230  NA 140 137 141  K >7.5* 8.2* 4.2  CL 102 109 101  CO2 12*  --  21*  GLUCOSE 94 89 167*  BUN 206* >130* 95*  CREATININE 22.03* >18.00* 10.43*  CALCIUM 9.7  --  9.4  PHOS  --   --  4.4    GFR: Estimated Creatinine Clearance: 7.5 mL/min (A) (by C-G formula based on SCr of 10.43 mg/dL (H)).  Liver Function Tests: Recent Labs  Lab 04/03/20 2230  ALBUMIN 3.4*    Urine analysis:    Component Value Date/Time   COLORURINE YELLOW 01/24/2020 0904   APPEARANCEUR CLEAR 01/24/2020 0904   LABSPEC 1.012 01/24/2020 0904   PHURINE 9.0 (H) 01/24/2020 0904   GLUCOSEU 150 (A) 01/24/2020 0904   HGBUR SMALL (A) 01/24/2020 0904   BILIRUBINUR NEGATIVE 01/24/2020 0904   [place] 01/24/2020 0904   PROTEINUR 100 (A) 01/24/2020 0904   NITRITE NEGATIVE 01/24/2020 0904   LEUKOCYTESUR MODERATE (A) 01/24/2020 0904    Radiological Exams on Admission: DG Chest Port 1 View  Result Date: 04/03/2020 CLINICAL DATA:  Hypoxemia, short of breath, end-stage renal disease EXAM: PORTABLE CHEST 1 VIEW COMPARISON:  03/21/2020 FINDINGS: Single frontal view of the chest demonstrates an enlarged cardiac silhouette. Diffuse interstitial prominence, ground-glass airspace disease, and vascular congestion compatible with fluid overload. No effusion or pneumothorax. No acute bony abnormalities. IMPRESSION: 1. Pulmonary edema. Electronically Signed   By: Randa Ngo M.D.   On: 04/03/2020 16:51     EKG: Independently reviewed.  EKG shows atrial fibrillation with RVR with occasional PVC.  No acute ST changes.  QTc 457  Assessment/Plan Principal Problem:   Acute on chronic congestive heart failure [facility]) Mr. Flaum will be admitted to cardiac telemetry. He was dialyzed last evening by nephrology but they were limited on the volume they could take off secondary to hypotension.  Nephrology plans to see again in the morning and plans for dialysis again in the morning. Supplement oxygen as needed keep O2 sat between 92  to 96%    ESRD on dialysis [facility]) Chronic with poor compliance with dialysis.    Anemia in ESRD (end-stage renal disease) ([facility]) Patient with hemoglobin of 6.4.  Transfuse 1 unit of packed red blood cells.  Patient will need to be dialyzed before further units can be provided to prevent volume overload.    Fluid overload Secondary to patient missing dialysis sessions    Essential hypertension Patient with hypotension during dialysis and will hold antihypertensives for now.    Hyperkalemia Patient initially with hyperkalemia potassium of 8.2.  After dialysis and treatment in ER potassium is now 4.2    Non-compliance with renal dialysis [facility]) Patient with chronic noncompliance.  Will need social work consult to assist with educating patient on importance of dialysis and helping to arrange for any mentions that may prevent future noncompliance    Atrial fibrillation with RVR ([place]) Patient given Cardizem 10 mg IV once and will monitor heart rhythm and rate closely. Patient started on Eliquis 2.5 mg p.o. twice daily      DVT prophylaxis: Eliquis for anticoagulation with atrial fibrillation elevated chads vas score Code Status:   Full Family Communication:  Diagnosis plan discussed with patient.  Patient verbalized understanding agrees with plan.  Disposition Plan:   Patient is from:  Home  Anticipated DC to:  Home   Anticipated DC date:  Anticipate greater than  2 midnight stay in the hospital to treat medical condition  Anticipated DC barriers: Discharge may be impeded by patient's poor insight into his chronic medical condition and his history of noncompliance  Consults called:  Nephrology Admission status:  Inpatient  Severity of Illness: The appropriate patient status for this patient is INPATIENT. Inpatient status is judged to be reasonable and necessary in order to provide the required intensity of service to ensure the patient's safety. The patient's presenting symptoms, physical exam findings, and initial radiographic and laboratory data in the context of their chronic comorbidities is felt to place them at high risk for further clinical deterioration. Furthermore, it is not anticipated that the patient will be medically stable for discharge from the hospital within 2 midnights of admission. The following factors support the patient status of inpatient.   " The patient's presenting symptoms include worsening shortness of breath and edema. " The worrisome physical exam findings include edema of legs.  Palpitations.  Rales, tachycardia " The initial radiographic and laboratory data are worrisome because of hyperkalemia, fluid overload. " The chronic co-morbidities include end-stage renal disease, CHF, history of noncompliance   * I certify that at the point of admission it is my clinical judgment that the patient will require inpatient hospital care spanning beyond 2 midnights from the point of admission due to high intensity of service, high risk for further deterioration and high frequency of surveillance required.Yevonne Aline Ellison Leisure MD [facility]  How to contact the [facility] Attending or Consulting provider [place] or covering provider during after hours [place], for this patient?   1. Check the care team in [facility] and look for a) attending/consulting [facility] provider listed and b) the [facility] team listed 2. Log into [email or web address] and use [facility]'s universal password to access. If you do not have the password, please contact the hospital operator. 3. Locate the [facility] provider you are looking for under [facility] and page to a number that you can be directly reached. 4. If you still have difficulty reaching the provider, please page  the [facility] (Director on Call) for the Hospitalists listed on amion for assistance.  04/04/2020, 3:17 AM

## 2020-04-04 NOTE — ED Notes (Addendum)
Spoke with dialysis, pt is to go back to hemodialysis this evening at the earliest. This RN made staff aware that pt is still on Bipap and in fluid overload. Was provided with pager number form Dr. Jonnie Finner nephrologist. Paged Dr.

## 2020-04-05 ENCOUNTER — Inpatient Hospital Stay (HOSPITAL_COMMUNITY): Payer: Medicare Other

## 2020-04-05 ENCOUNTER — Other Ambulatory Visit: Payer: Self-pay

## 2020-04-05 DIAGNOSIS — I5021 Acute systolic (congestive) heart failure: Secondary | ICD-10-CM

## 2020-04-05 LAB — CBC WITH DIFFERENTIAL/PLATELET
Abs Immature Granulocytes: 0.15 10*3/uL — ABNORMAL HIGH (ref 0.00–0.07)
Basophils Absolute: 0 10*3/uL (ref 0.0–0.1)
Basophils Relative: 0 %
Eosinophils Absolute: 0.2 10*3/uL (ref 0.0–0.5)
Eosinophils Relative: 2 %
HCT: 26.3 % — ABNORMAL LOW (ref 39.0–52.0)
Hemoglobin: 8.3 g/dL — ABNORMAL LOW (ref 13.0–17.0)
Immature Granulocytes: 2 %
Lymphocytes Relative: 6 %
Lymphs Abs: 0.4 10*3/uL — ABNORMAL LOW (ref 0.7–4.0)
MCH: 32.2 pg (ref 26.0–34.0)
MCHC: 31.6 g/dL (ref 30.0–36.0)
MCV: 101.9 fL — ABNORMAL HIGH (ref 80.0–100.0)
Monocytes Absolute: 0.8 10*3/uL (ref 0.1–1.0)
Monocytes Relative: 11 %
Neutro Abs: 5.7 10*3/uL (ref 1.7–7.7)
Neutrophils Relative %: 79 %
Platelets: 126 10*3/uL — ABNORMAL LOW (ref 150–400)
RBC: 2.58 MIL/uL — ABNORMAL LOW (ref 4.22–5.81)
RDW: 20.8 % — ABNORMAL HIGH (ref 11.5–15.5)
WBC: 7.2 10*3/uL (ref 4.0–10.5)
nRBC: 0 % (ref 0.0–0.2)

## 2020-04-05 LAB — MAGNESIUM: Magnesium: 2.2 mg/dL (ref 1.7–2.4)

## 2020-04-05 LAB — TYPE AND SCREEN
ABO/RH(D): O NEG
Antibody Screen: POSITIVE
Donor AG Type: NEGATIVE
Donor AG Type: NEGATIVE
Unit division: 0
Unit division: 0

## 2020-04-05 LAB — COMPREHENSIVE METABOLIC PANEL
ALT: 16 U/L (ref 0–44)
AST: 16 U/L (ref 15–41)
Albumin: 3.2 g/dL — ABNORMAL LOW (ref 3.5–5.0)
Alkaline Phosphatase: 102 U/L (ref 38–126)
Anion gap: 16 — ABNORMAL HIGH (ref 5–15)
BUN: 59 mg/dL — ABNORMAL HIGH (ref 6–20)
CO2: 27 mmol/L (ref 22–32)
Calcium: 9.4 mg/dL (ref 8.9–10.3)
Chloride: 98 mmol/L (ref 98–111)
Creatinine, Ser: 9.01 mg/dL — ABNORMAL HIGH (ref 0.61–1.24)
GFR calc Af Amer: 7 mL/min — ABNORMAL LOW (ref >60)
GFR calc non Af Amer: 6 mL/min — ABNORMAL LOW (ref >60)
Glucose, Bld: 82 mg/dL (ref 70–99)
Potassium: 4 mmol/L (ref 3.5–5.1)
Sodium: 141 mmol/L (ref 135–145)
Total Bilirubin: 1.9 mg/dL — ABNORMAL HIGH (ref 0.3–1.2)
Total Protein: 7.1 g/dL (ref 6.5–8.1)

## 2020-04-05 LAB — BLOOD GAS, ARTERIAL
Acid-base deficit: 0.3 mmol/L (ref 0.0–2.0)
Bicarbonate: 25.1 mmol/L (ref 20.0–28.0)
Drawn by: 13746
FIO2: 50
O2 Saturation: 99.7 %
Patient temperature: 37.2
pCO2 arterial: 50.9 mmHg — ABNORMAL HIGH (ref 32.0–48.0)
pH, Arterial: 7.314 — ABNORMAL LOW (ref 7.350–7.450)
pO2, Arterial: 188 mmHg — ABNORMAL HIGH (ref 83.0–108.0)

## 2020-04-05 LAB — BPAM RBC
Blood Product Expiration Date: 202107212359
Blood Product Expiration Date: 202108102359
ISSUE DATE / TIME: 202107140525
ISSUE DATE / TIME: 202107141527
Unit Type and Rh: 9500
Unit Type and Rh: 9500

## 2020-04-05 LAB — ECHOCARDIOGRAM LIMITED
Height: 64 in
MV M vel: 5.19 m/s
MV Peak grad: 107.7 mmHg
S' Lateral: 3.7 cm
Weight: 2560 oz

## 2020-04-05 LAB — PHOSPHORUS: Phosphorus: 6.1 mg/dL — ABNORMAL HIGH (ref 2.5–4.6)

## 2020-04-05 LAB — AMMONIA: Ammonia: 51 umol/L — ABNORMAL HIGH (ref 9–35)

## 2020-04-05 IMAGING — DX DG CHEST 1V PORT
1 series · 1 of 1 positions shown · non-contrast
Comparison: [DATE]

CLINICAL DATA: Shortness of breath

EXAM:
PORTABLE CHEST 1 VIEW

[chest]
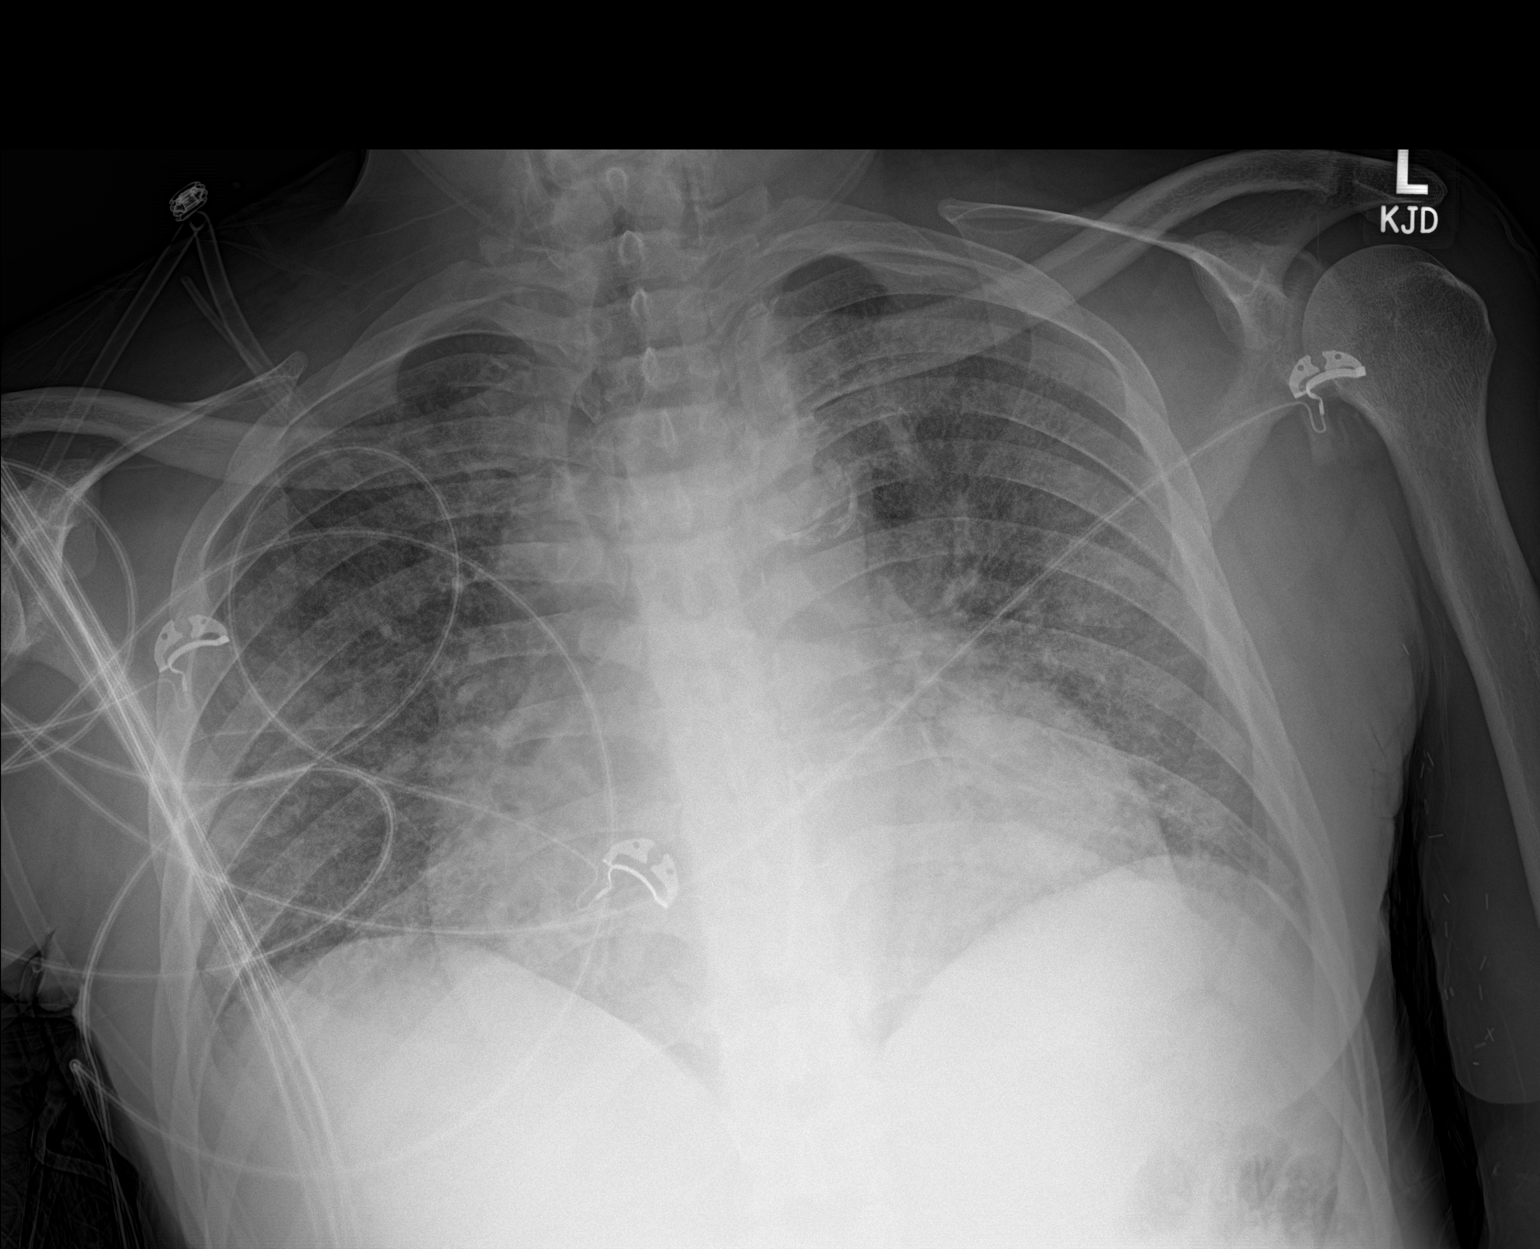

[1 of 1 positions shown; findings below may reference images not displayed]

FINDINGS: No significant change in AP portable examination with cardiomegaly
and diffuse bilateral interstitial opacity. No new airspace opacity.
The visualized skeletal structures are unremarkable.
IMPRESSION: No significant change in AP portable examination with cardiomegaly
and diffuse bilateral interstitial opacity, findings consistent with
edema. No new airspace opacity.

## 2020-04-05 MED ORDER — THIAMINE HCL 100 MG PO TABS
100.0000 mg | ORAL_TABLET | Freq: Every day | ORAL | Status: DC
  Administered 2020-04-06: 100 mg via ORAL
  Filled 2020-04-05: qty 1

## 2020-04-05 MED ORDER — ADULT MULTIVITAMIN W/MINERALS CH
1.0000 | ORAL_TABLET | Freq: Every day | ORAL | Status: DC
  Administered 2020-04-06: 1 via ORAL
  Filled 2020-04-05: qty 1

## 2020-04-05 MED ORDER — LORAZEPAM 2 MG/ML IJ SOLN
0.0000 mg | INTRAMUSCULAR | Status: DC
  Administered 2020-04-06 (×2): 1 mg via INTRAVENOUS
  Administered 2020-04-06: 2 mg via INTRAVENOUS
  Administered 2020-04-07: 4 mg via INTRAVENOUS
  Administered 2020-04-07: 2 mg via INTRAVENOUS
  Administered 2020-04-07: 1 mg via INTRAVENOUS
  Filled 2020-04-05 (×5): qty 1
  Filled 2020-04-05 (×2): qty 2

## 2020-04-05 MED ORDER — LORAZEPAM 2 MG/ML IJ SOLN
1.0000 mg | Freq: Once | INTRAMUSCULAR | Status: AC
  Administered 2020-04-05: 1 mg via INTRAVENOUS
  Filled 2020-04-05: qty 1

## 2020-04-05 MED ORDER — DIPHENHYDRAMINE HCL 25 MG PO CAPS
25.0000 mg | ORAL_CAPSULE | Freq: Four times a day (QID) | ORAL | Status: AC | PRN
  Administered 2020-04-05: 25 mg via ORAL
  Filled 2020-04-05: qty 1

## 2020-04-05 MED ORDER — LORAZEPAM 2 MG/ML IJ SOLN
INTRAMUSCULAR | Status: AC
  Filled 2020-04-05: qty 1

## 2020-04-05 MED ORDER — HALOPERIDOL LACTATE 5 MG/ML IJ SOLN
2.0000 mg | Freq: Four times a day (QID) | INTRAMUSCULAR | Status: AC | PRN
  Administered 2020-04-05: 2 mg via INTRAVENOUS
  Filled 2020-04-05: qty 1

## 2020-04-05 MED ORDER — FOLIC ACID 1 MG PO TABS
1.0000 mg | ORAL_TABLET | Freq: Every day | ORAL | Status: DC
  Administered 2020-04-06: 1 mg via ORAL
  Filled 2020-04-05: qty 1

## 2020-04-05 MED ORDER — IPRATROPIUM BROMIDE 0.02 % IN SOLN
0.5000 mg | Freq: Four times a day (QID) | RESPIRATORY_TRACT | Status: DC
  Administered 2020-04-05 (×2): 0.5 mg via RESPIRATORY_TRACT
  Filled 2020-04-05 (×2): qty 2.5

## 2020-04-05 MED ORDER — CAMPHOR-MENTHOL 0.5-0.5 % EX LOTN
TOPICAL_LOTION | CUTANEOUS | Status: DC | PRN
  Filled 2020-04-05 (×2): qty 222

## 2020-04-05 MED ORDER — THIAMINE HCL 100 MG/ML IJ SOLN
100.0000 mg | Freq: Every day | INTRAMUSCULAR | Status: DC
  Administered 2020-04-06: 100 mg via INTRAVENOUS
  Filled 2020-04-05: qty 2

## 2020-04-05 MED ORDER — LACTULOSE 10 GM/15ML PO SOLN
20.0000 g | Freq: Two times a day (BID) | ORAL | Status: DC
  Administered 2020-04-06 (×2): 20 g via ORAL
  Filled 2020-04-05 (×3): qty 30

## 2020-04-05 MED ORDER — HYDRALAZINE HCL 20 MG/ML IJ SOLN: 10.0000 mg | INTRAMUSCULAR | Status: DC | PRN

## 2020-04-05 MED ORDER — DIPHENHYDRAMINE HCL 50 MG/ML IJ SOLN
25.0000 mg | Freq: Once | INTRAMUSCULAR | Status: AC
  Administered 2020-04-05: 25 mg via INTRAVENOUS
  Filled 2020-04-05: qty 1

## 2020-04-05 MED ORDER — LORAZEPAM 2 MG/ML IJ SOLN
1.0000 mg | INTRAMUSCULAR | Status: DC | PRN
  Administered 2020-04-05: 2 mg via INTRAVENOUS
  Filled 2020-04-05: qty 1

## 2020-04-05 MED ORDER — LORAZEPAM 2 MG/ML IJ SOLN: 0.0000 mg | Freq: Three times a day (TID) | INTRAMUSCULAR | Status: DC

## 2020-04-05 MED ORDER — IPRATROPIUM BROMIDE 0.02 % IN SOLN
0.5000 mg | Freq: Two times a day (BID) | RESPIRATORY_TRACT | Status: DC
  Filled 2020-04-05: qty 2.5

## 2020-04-05 MED ORDER — LEVALBUTEROL HCL 0.63 MG/3ML IN NEBU
0.6300 mg | INHALATION_SOLUTION | Freq: Two times a day (BID) | RESPIRATORY_TRACT | Status: DC
  Filled 2020-04-05: qty 3

## 2020-04-05 MED ORDER — LEVALBUTEROL HCL 0.63 MG/3ML IN NEBU
0.6300 mg | INHALATION_SOLUTION | Freq: Four times a day (QID) | RESPIRATORY_TRACT | Status: DC
  Administered 2020-04-05 (×2): 0.63 mg via RESPIRATORY_TRACT
  Filled 2020-04-05 (×2): qty 3

## 2020-04-05 MED ORDER — LORAZEPAM 1 MG PO TABS
1.0000 mg | ORAL_TABLET | ORAL | Status: DC | PRN
  Administered 2020-04-06: 1 mg via ORAL
  Filled 2020-04-05 (×2): qty 1

## 2020-04-05 NOTE — Progress Notes (Addendum)
Pt came back from dialysis on 4LNC.  Sp02 is 99%.  Pt was given ativan while at dialysis.  Pt is sleeping at this time, RR is 18, no distress noted at this time.  Bipap on standby in the room if needed.  RT will continue to monitor.

## 2020-04-05 NOTE — ED Notes (Signed)
ECHO text in  Room.

## 2020-04-05 NOTE — ED Notes (Signed)
Breakfast Ordered--Jordan [name]  

## 2020-04-05 NOTE — Progress Notes (Signed)
Patient transported to dialysis without complication. Patient tolerated well. RT will continue to monitor.

## 2020-04-05 NOTE — ED Notes (Signed)
Lunch Tray Ordered @ 1120. 

## 2020-04-05 NOTE — Progress Notes (Signed)
Patient arrived to unit from dialysis sedated but rouses to voice. Currently on 4L [place] satting in low 90s; RT has set up bipap should it become necessary.

## 2020-04-05 NOTE — Progress Notes (Signed)
[facility] Progress Note   Subjective: Seen in ED lying in low fowlers. Still some SOB. AFib/Aflutter on heart monitor rate 98-118.   Objective Vitals:   04/05/20 0100 04/05/20 0201 04/05/20 0258 04/05/20 0456  BP: 135/80 (!) 162/86 130/85   Pulse: 89 91 90   Resp: 19 18 10    Temp:      TempSrc:      SpO2: 100% 100% 98%   Weight:    72.6 kg  Height:    5\' 4"  (1.626 m)   Physical Exam General: Chronically ill appearing male in NAD Heart: S1,S2 irregular Lungs: Bilateral breath sounds with few bibasilar crackles. O2@ 6L/M [place] Abdomen:Active BS Extremities: No LE edema Dialysis Access: L AVF aneurysmal + bruit  Additional Objective Labs: Basic Metabolic Panel: Recent Labs  Lab 04/03/20 2230 04/04/20 0500 04/05/20 0500  NA 141 140 141  K 4.2 5.3* 4.0  CL 101 100 98  CO2 21* 20* 27  GLUCOSE 167* 138* 82  BUN 95* 100* 59*  CREATININE 10.43* 12.51* 9.01*  CALCIUM 9.4 8.9 9.4  PHOS 4.4  --  6.1*   Liver Function Tests: Recent Labs  Lab 04/03/20 2230 04/05/20 0500  AST  --  16  ALT  --  16  ALKPHOS  --  102  BILITOT  --  1.9*  PROT  --  7.1  ALBUMIN 3.4* 3.2*   No results for input(s): LIPASE, AMYLASE in the last 168 hours. CBC: Recent Labs  Lab 04/03/20 1710 04/03/20 1737 04/03/20 2230 04/04/20 1345 04/05/20 0500  WBC 7.1   < > 7.1 7.5 7.2  NEUTROABS 6.0  --   --   --  5.7  HGB 7.1*   < > 6.4* 6.7* 8.3*  HCT 25.0*   < > 20.3* 21.9* 26.3*  MCV 116.3*  --  105.7* 106.3* 101.9*  PLT 165   < > 135* 127* 126*   < > = values in this interval not displayed.   Blood Culture    Component Value Date/Time   SDES BLOOD RIGHT FOREARM 11/12/2019 0037   SDES BLOOD RIGHT HAND 11/12/2019 0037   SPECREQUEST Blood Culture adequate volume 11/12/2019 0037   SPECREQUEST Blood Culture adequate volume 11/12/2019 0037   CULT  11/12/2019 0037    NO GROWTH 5 DAYS Performed at [facility], [address] [place], [place] [address]    CULT   11/12/2019 0037    NO GROWTH 5 DAYS Performed at [facility], [address] [place], [place] [address]    REPTSTATUS 11/17/2019 FINAL 11/12/2019 0037   REPTSTATUS 11/17/2019 FINAL 11/12/2019 0037    Cardiac Enzymes: No results for input(s): CKTOTAL, CKMB, CKMBINDEX, TROPONINI in the last 168 hours. CBG: No results for input(s): GLUCAP in the last 168 hours. Iron Studies: No results for input(s): IRON, TIBC, TRANSFERRIN, FERRITIN in the last 72 hours. @lablastinr3 @ Studies/Results: DG CHEST PORT 1 VIEW  Result Date: 04/04/2020 CLINICAL DATA:  Dyspnea EXAM: PORTABLE CHEST 1 VIEW COMPARISON:  04/03/2020 FINDINGS: Moderate cardiomegaly. Atherosclerotic calcification of the aortic knob. Pulmonary vascular congestion with diffuse bilateral interstitial opacities suggesting pulmonary edema. No large pleural fluid collection. No pneumothorax. IMPRESSION: Cardiomegaly with pulmonary vascular congestion and diffuse bilateral interstitial opacities suggesting pulmonary edema. Electronically Signed   By: Davina Poke D.O.   On: 04/04/2020 14:06   DG Chest Port 1 View  Result Date: 04/03/2020 CLINICAL DATA:  Hypoxemia, short of breath, end-stage renal disease EXAM: PORTABLE CHEST 1 VIEW  COMPARISON:  03/21/2020 FINDINGS: Single frontal view of the chest demonstrates an enlarged cardiac silhouette. Diffuse interstitial prominence, ground-glass airspace disease, and vascular congestion compatible with fluid overload. No effusion or pneumothorax. No acute bony abnormalities. IMPRESSION: 1. Pulmonary edema. Electronically Signed   By: Randa Ngo M.D.   On: 04/03/2020 16:51   Medications: . sodium chloride    . sodium chloride    . sodium chloride    . sodium chloride    . amiodarone 30 mg/hr (04/05/20 1227)   . sodium chloride   Intravenous Once  . apixaban  2.5 mg Oral BID  . Chlorhexidine Gluconate Cloth  6 each Topical Q0600  . Chlorhexidine Gluconate Cloth  6 each Topical  Q0600  . patiromer  8.4 g Oral Daily  . sevelamer carbonate  800 mg Oral TID WC  . sodium chloride flush  3 mL Intravenous Q12H     OP HD: MWF [facility]  4h  68kg  Leaving at 71kg most rx's   L arm BCF  mircera 225 last 5.7  calc 1 ug tiw    Assessment/ Plan: 1. SOB/ resp distress - Off BIPAP but requiring high flow O2. Will have HD again today for serial volume removal. HD 07/14 Net UF 3.5 liters. No post wt. Continue lowering volume as tolerated.  2. AFib RVR-on IV amiodarone. Started on Eliquis per cards.  3. Hyperkalemia - K > 7.5 on admit, resolved with HD. K+ 4.0 today.  4. ESRD - on HD MWF.  HD today off schedule.  5. HTN - cont acei/ hydralazine 6. Anemia ckd - tranfuse prn 7. MBD ckd - cont lanthanum/ sevelamer, Ca + phos at goal.   Jimmye Norman. Jennipher Weatherholtz NP-C 04/05/2020, 12:40 PM  [employer] [phone]

## 2020-04-05 NOTE — Progress Notes (Signed)
PROGRESS NOTE    Jordan [name]  VVO:[id number] DOB: 19-Jun-1966 DOA: 04/03/2020 PCP: Jearld Fenton, NP  Brief Narrative:  The patient is a 54 year old overweight African-American male with a past medical history significant for but not limited to ESRD on hemodialysis, anemia, history of emphysema of the lung, alcohol abuse, hypertension, aortic atherosclerosis as well as other comorbidities presented to the emergency room with shortness of breath after he had missed several dialysis sessions.  He states that he did not want to go and he has had noncompliance with hemodialysis in the past.  At home he had a welfare check and he was found to have shortness of breath and EMS was contacted and he was brought to the hospital.  He denies any fevers or chills and any chest pain but he states that his heart does race quickly sometimes.  He does have a history of volume overload and he states that his appetite has been normal but he is hungry and is not a very good historian.  In the emergency room is found to have volume overload with bilateral rales and leg edema and had increased BUN and creatinine from his baseline as well as hyperkalemia.  He was treated with calcium gluconate, insulin and D50 in the emergency room and underwent urgent dialysis but there were limited to how much fluid they could drop due to his hypotension and he was sent back to the emergency room.  He continued to have shortness of breath and found to be A. fib with RVR and he was placed on BiPAP.  Hospitalist admitted nephrology is planning to dialyze again today and because of his continued hypotension and A. fib with RVR cardiology was consulted for volume overload as well as assistance with heart rate management.  Cardiology started the patient on an amiodarone drip and he spontaneously converted to normal sinus rhythm however then converted back in the atrial fibrillation.  He will remain on amiodarone drip for now and nephrology is  planning on dialyzing the patient again today.  He was agitated earlier this afternoon and started pulling out his stuff so he was given temazepam and started on a CIWA protocol given his history of alcohol abuse.  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 fib with RVR could be contributing so cardiology been consulted for assistance with heart rate management and he is placed on a amiodarone drip; his heart rate spontaneously converted to normal sinus rhythm but now back in A. fib with RVR -Continue with volume management with dialysis and appreciate further nephrology and cardiology recommendations patient is to be dialyzed again tonight  Acute respiratory failure with hypoxia in the setting of volume overload from acute on chronic combined systolic CHF from missed dialysis -  Requiring BiPAP when I saw him and had some leg edema crackles yesterday; today he is back on 6 L but acutely worsened with his respiratory status so had to be placed back on BiPAP and an ABG was done and showed a pH of 7.31 and a PCO2 of 50 -Continue supplemental oxygen via nasal cannula as well as BiPAP as needed -Wean O2 to room air and continue with volume management per dialysis -Continuous pulse  oximetry maintain O2 saturations greater than 90% -SpO2: 98 % O2 Flow Rate (L/min): 6 L/min FiO2 (%): 50 %  -Started the patient on Xopenex/Atrovent scheduled -Repeat chest x-ray in the a.m. and will need an ambulatory home O2 screen prior to discharge  ESRD on Dialysis [facility]) Metabolic acidosis -Chronic with poor compliance with dialysis. -Patient's BUN/Cr on Admission was than 130/greater than 18.00 and after dialysis it is now 59/9.01 -Patient's metabolic acidosis is improving and initially his CO2 was 12, anion gap was 26, chloride level was 109 -Repeat CO2 is now 27, anion gap is 16, chloride level is 98 -Avoid nephrotoxic medications, contrast dyes, hypotension and renally adjust medications and further deferment of dialysis to nephrology Patient is likely to be dialyzed again today  Anemia in ESRD (end-stage renal disease) (HCC)/Macrocytic Anemia -Patient with hemoglobin of 6.4 and a hematocrit of 20.3.  -Transfuse 1 unit of packed red blood cells however could not be complete as patient became volume overloaded and more dyspneic  -Patient will need to be dialyzed before further units can be provided to prevent volume overload. -I spoke with nephrology and they will evaluate to see if he needs another unit of PRBCs in the dialysis session ; now his hemoglobin/hematocrit is/26.3 -Continue to monitor for signs and symptoms of bleeding; currently no overt bleeding noted For repeat CBC in a.m.  Fluid Overload  -secondary to missed dialysis sessions as above -Appreciate volume management per nephrology recommendations he needs will be dialyzed today  Essential Hypertension -Patient with hypotension during dialysis and will hold antihypertensives for now. -Continues to be hypotensive and remains on a Cardizem drip next-we will defer to cardiology for further adjustment  Hyperkalemia  -Patient initially with hyperkalemia potassium of 8.2. After dialysis and treatment in ER  potassium was 4.2 and repeat this morning was 5.8 next-patient likely to be dialyzed again and further management per nephrology  Non-compliance with Renal Dialysis [facility]) -Patient with chronic noncompliance.  -Will need social work consult to assist with educating patient on importance of dialysis and helping to arrange for any mentions that may prevent future noncompliance  New onset atrial Fibrillation with RVR ([place]) -Patient given Cardizem 10 mg IV once and will monitor heart rhythm and rate closely. -Patient started on Eliquis 2.5 mg p.o. twice daily -Have consulted Cardiology for further assistance in management given his uncontrolled heart rates, hypotension and continued volume overload and they have started the patient on an amiodarone drip and will continue the amiodarone drip -Cardiology recommending repeating echocardiogram which has been done but pending read -Cardiology was considering transitioning to p.o. amiodarone tomorrow however he is back in A. fib with RVR so we will continue IV amiodarone for now -Patient is not a candidate for long-term chronic anticoagulation given his poor compliance and hemodialysis -Just repeating an echocardiogram to rule out pericardial effusion  Thrombocytopenia -Patient's platelet count from 165 is now 135  -Continue to monitor for signs and symptoms of bleeding; currently no overt bleeding or -Repeat CBC in the a.m.  Agitation and tremors  with concern for alcohol withdrawal -Has a history of alcohol abuse -Placed on stepdown CIWA protocol with IV lorazepam and continue with multivitamin, folic acid and thiamine -Continue to monitor for signs and symptoms of withdrawal  Hyperammonemia -Patient is confused and agitated a little bit and could be from withdrawal but will treat his ammonia level as it is 51 and give him some lactulose twice daily  DVT prophylaxis: Anticoagulated with Eliquis Code Status: FULL CODE  Family Communication: No  family present at bedside  Disposition Plan: Pending further clearance by cardiology nephrology and patient being back to his baseline  Status is: Inpatient  Remains inpatient appropriate because:Altered mental status, Unsafe d/c plan, IV treatments appropriate due to intensity of illness or inability to take PO and Inpatient level of care appropriate due to severity of illness   Dispo: The patient is from: Home              Anticipated d/c is to: TBD              Anticipated d/c date is: 2 days              Patient currently is not medically stable to d/c.  Consultants:   Nephrology  Cardiology  Procedures:  ECHOCARDIOGRAM Done and pending read   Antimicrobials:  Anti-infectives (From admission, onward)   None     Subjective: Seen and examined at bedside and he was complaining of wanted to go home but then later became confused and started pulling on his lines.  He became very agitated and has some tremors so CIWA protocol was initiated.  Patient respiratory status also worsened so he is placed back on BiPAP.  Patient did not complain of any shortness of breath when I saw him but continues to be volume overloaded.  He does have some slight wheezing and heart rate is now irregularly irregular and tachycardic again.  No other concerns or complaints at this time  Objective: Vitals:   04/05/20 1300 04/05/20 1414 04/05/20 1426 04/05/20 1624  BP: 123/80 (!) 150/75  (!) 148/77  Pulse: 98 92 96 81  Resp: (!) 21 19 20 18   Temp:    97.9 F (36.6 C)  TempSrc:    Axillary  SpO2: 100% 92% 98%   Weight:      Height:        Intake/Output Summary (Last 24 hours) at 04/05/2020 1805 Last data filed at 04/05/2020 0039 Gross per 24 hour  Intake 200 ml  Output --  Net 200 ml   Filed Weights   04/03/20 2003 04/05/20 0456  Weight: 72 kg 72.6 kg   Examination: Physical Exam:  Constitutional: WN/WD overweight African-American male currently appears slightly uncomfortable and mildly  agitated. Eyes: Lids and conjunctivae normal, sclerae anicteric  ENMT: External Ears, Nose appear normal. Grossly normal hearing.  Neck: Appears normal, supple, no cervical masses, normal ROM, no appreciable thyromegaly: No JVD Respiratory: Diminished to auscultation bilaterally with coarse breath sounds and crackles with scattered wheezing but no appreciable rales or rhonchi.  He has slightly labored breathing and he is wearing 6 L supplemental oxygen via nasal cannula I saw him but then ended up back on the BiPAP Cardiovascular: Irregularly irregular and tachycardic, has a 2 out of 6 systolic murmur.  1-2+ lower extremity edema Abdomen: Soft, non-tender, distended secondary body habitus no masses. Bowel sounds positive.  GU: Deferred. Musculoskeletal: No clubbing / cyanosis of digits/nails. No joint deformity upper and lower extremities.  Skin: No  rashes, lesions, ulcers on limited skin evaluation. No induration; Warm and dry.  Neurologic: CN 2-12 grossly intact with no focal deficits.  Romberg sign cerebellar reflexes not assessed.  Psychiatric: Slightly impaired judgment and insight.  He is awake and alert. mildly agitated mood and appropriate affect.   Data Reviewed: I have personally reviewed following labs and imaging studies  CBC: Recent Labs  Lab 04/03/20 1710 04/03/20 1737 04/03/20 2230 04/04/20 1345 04/05/20 0500  WBC 7.1  --  7.1 7.5 7.2  NEUTROABS 6.0  --   --   --  5.7  HGB 7.1* 7.8* 6.4* 6.7* 8.3*  HCT 25.0* 23.0* 20.3* 21.9* 26.3*  MCV 116.3*  --  105.7* 106.3* 101.9*  PLT 165  --  135* 127* 854*   Basic Metabolic Panel: Recent Labs  Lab 04/03/20 1710 04/03/20 1737 04/03/20 2230 04/04/20 0500 04/05/20 0500  NA 140 137 141 140 141  K >7.5* 8.2* 4.2 5.3* 4.0  CL 102 109 101 100 98  CO2 12*  --  21* 20* 27  GLUCOSE 94 89 167* 138* 82  BUN 206* >130* 95* 100* 59*  CREATININE 22.03* >18.00* 10.43* 12.51* 9.01*  CALCIUM 9.7  --  9.4 8.9 9.4  MG  --   --   --    --  2.2  PHOS  --   --  4.4  --  6.1*   GFR: Estimated Creatinine Clearance: 8.8 mL/min (A) (by C-G formula based on SCr of 9.01 mg/dL (H)). Liver Function Tests: Recent Labs  Lab 04/03/20 2230 04/05/20 0500  AST  --  16  ALT  --  16  ALKPHOS  --  102  BILITOT  --  1.9*  PROT  --  7.1  ALBUMIN 3.4* 3.2*   No results for input(s): LIPASE, AMYLASE in the last 168 hours. Recent Labs  Lab 04/05/20 1632  AMMONIA 51*   Coagulation Profile: No results for input(s): INR, PROTIME in the last 168 hours. Cardiac Enzymes: No results for input(s): CKTOTAL, CKMB, CKMBINDEX, TROPONINI in the last 168 hours. BNP (last 3 results) No results for input(s): PROBNP in the last 8760 hours. HbA1C: No results for input(s): HGBA1C in the last 72 hours. CBG: No results for input(s): GLUCAP in the last 168 hours. Lipid Profile: No results for input(s): CHOL, HDL, LDLCALC, TRIG, CHOLHDL, LDLDIRECT in the last 72 hours. Thyroid Function Tests: No results for input(s): TSH, T4TOTAL, FREET4, T3FREE, THYROIDAB in the last 72 hours. Anemia Panel: No results for input(s): VITAMINB12, FOLATE, FERRITIN, TIBC, IRON, RETICCTPCT in the last 72 hours. Sepsis Labs: No results for input(s): PROCALCITON, LATICACIDVEN in the last 168 hours.  Recent Results (from the past 240 hour(s))  SARS Coronavirus 2 by RT PCR (hospital order, performed in [facility] hospital lab) Nasopharyngeal Nasopharyngeal Swab     Status: None   Collection Time: 04/03/20  5:10 PM   Specimen: Nasopharyngeal Swab  Result Value Ref Range Status   SARS Coronavirus 2 NEGATIVE NEGATIVE Final    Comment: (NOTE) SARS-CoV-2 target nucleic acids are NOT DETECTED.  The SARS-CoV-2 RNA is generally detectable in upper and lower respiratory specimens during the acute phase of infection. The lowest concentration of SARS-CoV-2 viral copies this assay can detect is 250 copies / mL. A negative result does not preclude SARS-CoV-2 infection and  should not be used as the sole basis for treatment or other patient management decisions.  A negative result may occur with improper specimen collection / handling, submission of  specimen other than nasopharyngeal swab, presence of viral mutation(s) within the areas targeted by this assay, and inadequate number of viral copies (<250 copies / mL). A negative result must be combined with clinical observations, patient history, and epidemiological information.  Fact Sheet for Patients:   StrictlyIdeas.no  Fact Sheet for Healthcare Providers: BankingDealers.co.za  This test is not yet approved or  cleared by the [place] FDA and has been authorized for detection and/or diagnosis of SARS-CoV-2 by FDA under an Emergency Use Authorization (EUA).  This EUA will remain in effect (meaning this test can be used) for the duration of the COVID-19 declaration under Section 564(b)(1) of the Act, 21 U.S.C. section 360bbb-3(b)(1), unless the authorization is terminated or revoked sooner.  Performed at [facility], [place] [address], [place], [place] [address]      RN Pressure Injury Documentation:     Estimated body mass index is 27.46 kg/m as calculated from the following:   Height as of this encounter: 5\' 4"  (1.626 m).   Weight as of this encounter: 72.6 kg.  Malnutrition Type:      Malnutrition Characteristics:      Nutrition Interventions:    Radiology Studies: DG CHEST PORT 1 VIEW  Result Date: 04/05/2020 CLINICAL DATA:  Shortness of breath EXAM: PORTABLE CHEST 1 VIEW COMPARISON:  04/04/2020 FINDINGS: No significant change in AP portable examination with cardiomegaly and diffuse bilateral interstitial opacity. No new airspace opacity. The visualized skeletal structures are unremarkable. IMPRESSION: No significant change in AP portable examination with cardiomegaly and diffuse bilateral interstitial opacity, findings  consistent with edema. No new airspace opacity. Electronically Signed   By: Eddie Candle M.D.   On: 04/05/2020 15:44   DG CHEST PORT 1 VIEW  Result Date: 04/04/2020 CLINICAL DATA:  Dyspnea EXAM: PORTABLE CHEST 1 VIEW COMPARISON:  04/03/2020 FINDINGS: Moderate cardiomegaly. Atherosclerotic calcification of the aortic knob. Pulmonary vascular congestion with diffuse bilateral interstitial opacities suggesting pulmonary edema. No large pleural fluid collection. No pneumothorax. IMPRESSION: Cardiomegaly with pulmonary vascular congestion and diffuse bilateral interstitial opacities suggesting pulmonary edema. Electronically Signed   By: Davina Poke D.O.   On: 04/04/2020 14:06   Scheduled Meds:  sodium chloride   Intravenous Once   apixaban  2.5 mg Oral BID   Chlorhexidine Gluconate Cloth  6 each Topical Q0600   Chlorhexidine Gluconate Cloth  6 each Topical O1751   folic acid  1 mg Oral Daily   ipratropium  0.5 mg Nebulization Q6H   levalbuterol  0.63 mg Nebulization Q6H   LORazepam  0-4 mg Intravenous Q4H   Followed by   Derrill Memo ON 04/07/2020] LORazepam  0-4 mg Intravenous Q8H   multivitamin with minerals  1 tablet Oral Daily   patiromer  8.4 g Oral Daily   sevelamer carbonate  800 mg Oral TID WC   sodium chloride flush  3 mL Intravenous Q12H   thiamine  100 mg Oral Daily   Or   thiamine  100 mg Intravenous Daily   Continuous Infusions:  sodium chloride     sodium chloride     sodium chloride     sodium chloride     amiodarone 30 mg/hr (04/05/20 1227)    LOS: 1 day   Kerney Elbe, DO [facility] PAGER is on AMION  If 7PM-7AM, please contact night-coverage [email or web address]

## 2020-04-05 NOTE — Progress Notes (Addendum)
Progress Note  Patient Name: Jordan [name] Date of Encounter: 04/05/2020  Primary Cardiologist: New   Subjective   Feeling well this AM. No specific complaints. Wants to eat. Maintaining NSR   Inpatient Medications    Scheduled Meds:  sodium chloride   Intravenous Once   apixaban  2.5 mg Oral BID   Chlorhexidine Gluconate Cloth  6 each Topical Q0600   Chlorhexidine Gluconate Cloth  6 each Topical Q0600   patiromer  8.4 g Oral Daily   sevelamer carbonate  800 mg Oral TID WC   sodium chloride flush  3 mL Intravenous Q12H   Continuous Infusions:  sodium chloride     sodium chloride     sodium chloride     sodium chloride     amiodarone 30 mg/hr (04/05/20 0038)   PRN Meds: sodium chloride, sodium chloride, sodium chloride, acetaminophen, alteplase, camphor-menthol, diphenhydrAMINE, heparin, heparin, lidocaine (PF), lidocaine-prilocaine, ondansetron (ZOFRAN) IV, pentafluoroprop-tetrafluoroeth, sodium chloride flush   Vital Signs    Vitals:   04/05/20 0100 04/05/20 0201 04/05/20 0258 04/05/20 0456  BP: 135/80 (!) 162/86 130/85   Pulse: 89 91 90   Resp: 19 18 10    Temp:      TempSrc:      SpO2: 100% 100% 98%   Weight:    72.6 kg  Height:    5\' 4"  (1.626 m)    Intake/Output Summary (Last 24 hours) at 04/05/2020 0724 Last data filed at 04/05/2020 0039 Gross per 24 hour  Intake 555.83 ml  Output 3000 ml  Net -2444.17 ml   Filed Weights   04/03/20 2003 04/05/20 0456  Weight: 72 kg 72.6 kg    Physical Exam   General: Well developed, well nourished, NAD Neck: Negative for carotid bruits. No JVD Lungs:Clear to ausculation bilaterally. No wheezes, rales, or rhonchi. Breathing is unlabored. Cardiovascular: RRR with S1 S2. No murmur Abdomen: Soft, non-tender, non-distended. No obvious abdominal masses. Extremities: No edema. Radial  pulses 2+ bilaterally Neuro: Alert and oriented. No focal deficits. No facial asymmetry. MAE spontaneously. Psych: Responds to  questions appropriately with normal affect.    Labs    Chemistry Recent Labs  Lab 04/03/20 2230 04/04/20 0500 04/05/20 0500  NA 141 140 141  K 4.2 5.3* 4.0  CL 101 100 98  CO2 21* 20* 27  GLUCOSE 167* 138* 82  BUN 95* 100* 59*  CREATININE 10.43* 12.51* 9.01*  CALCIUM 9.4 8.9 9.4  PROT  --   --  7.1  ALBUMIN 3.4*  --  3.2*  AST  --   --  16  ALT  --   --  16  ALKPHOS  --   --  102  BILITOT  --   --  1.9*  GFRNONAA 5* 4* 6*  GFRAA 6* 5* 7*  ANIONGAP 19* 20* 16*     Hematology Recent Labs  Lab 04/03/20 2230 04/04/20 1345 04/05/20 0500  WBC 7.1 7.5 7.2  RBC 1.92* 2.06* 2.58*  HGB 6.4* 6.7* 8.3*  HCT 20.3* 21.9* 26.3*  MCV 105.7* 106.3* 101.9*  MCH 33.3 32.5 32.2  MCHC 31.5 30.6 31.6  RDW 18.2* 19.1* 20.8*  PLT 135* 127* 126*    Cardiac EnzymesNo results for input(s): TROPONINI in the last 168 hours. No results for input(s): TROPIPOC in the last 168 hours.   BNPNo results for input(s): BNP, PROBNP in the last 168 hours.   DDimer No results for input(s): DDIMER in the last 168 hours.  Radiology    DG CHEST PORT 1 VIEW  Result Date: 04/04/2020 CLINICAL DATA:  Dyspnea EXAM: PORTABLE CHEST 1 VIEW COMPARISON:  04/03/2020 FINDINGS: Moderate cardiomegaly. Atherosclerotic calcification of the aortic knob. Pulmonary vascular congestion with diffuse bilateral interstitial opacities suggesting pulmonary edema. No large pleural fluid collection. No pneumothorax. IMPRESSION: Cardiomegaly with pulmonary vascular congestion and diffuse bilateral interstitial opacities suggesting pulmonary edema. Electronically Signed   By: Davina Poke D.O.   On: 04/04/2020 14:06   DG Chest Port 1 View  Result Date: 04/03/2020 CLINICAL DATA:  Hypoxemia, short of breath, end-stage renal disease EXAM: PORTABLE CHEST 1 VIEW COMPARISON:  03/21/2020 FINDINGS: Single frontal view of the chest demonstrates an enlarged cardiac silhouette. Diffuse interstitial prominence, ground-glass airspace  disease, and vascular congestion compatible with fluid overload. No effusion or pneumothorax. No acute bony abnormalities. IMPRESSION: 1. Pulmonary edema. Electronically Signed   By: Randa Ngo M.D.   On: 04/03/2020 16:51   Telemetry    04/05/20 NSR with PACs- Personally Reviewed  ECG    No new tracing as of 04/05/20- Personally Reviewed  Cardiac Studies   Echocardiogram 04/05/2020: Pending   Echocardiogram 01/24/2020:   1. Left ventricular ejection fraction, by estimation, is 45 to 50%. The  left ventricle has mildly decreased function. The left ventricle  demonstrates global hypokinesis. There is moderate left ventricular  hypertrophy. Left ventricular diastolic  parameters are consistent with Grade III diastolic dysfunction  (restrictive). Elevated left ventricular end-diastolic pressure.   2. Right ventricular systolic function is normal. The right ventricular  size is normal. There is severely elevated pulmonary artery systolic  pressure.   3. Left atrial size was severely dilated.   4. Right atrial size was severely dilated.   5. The mitral valve is grossly normal. Mild mitral valve regurgitation.  No evidence of mitral stenosis.   6. Tricuspid valve regurgitation is moderate.   7. The aortic valve is tricuspid. Aortic valve regurgitation is not  visualized. Mild aortic valve sclerosis is present, with no evidence of  aortic valve stenosis.   8. The inferior vena cava is dilated in size with <50% respiratory  variability, suggesting right atrial pressure of 15 mmHg.   Comparison(s): No prior Echocardiogram.   Patient Profile     54 y.o. male with a hx of ESRD noncompliant with HD, anemia of chronic kidney disease, recurrent hyperkalemia, secondary hyperparathyroidism, history of EtOH and tobacco abuse who is being seen today for the evaluation of new onset atrial fibrillation at the request of Dr. Alfredia Ferguson.   Assessment & Plan    1. New onset AF with RVR: -Patient  loaded with IV amiodarone infusion to help maintain NSR>>tele review today with NSR and PACs -Started on low-dose Eliquis per primary team however suspect is not a candidate for chronic long-term anticoagulation given history of poor compliance and hemodialysis -Plan to repeat echocardiogram to rule out pericardial effusion -Continue IV Amio for now>>likely transition today or tomorrow to PO   2. Acute respiratory failure: -Stabilized, currently maintaining adequate saturations on nasal cannula  3.  Acute on chronic combined systolic and diastolic CHF: -Prior echocardiogram from May 2021 with LVEF of 45 to 50%>> cardiology service not consulted at that time -Given history of noncompliance, likely poor candidate for further invasive study -Plan for GDMT with -I&O, net -3.9 L>> fluid volume managed with HD -Weight, 160lb   4.  Hyperkalemia: -Stabilized with HD -K+, 4.0 today   5.  Anemia of chronic disease: -Hemoglobin, 8.3 this a.m.  up from 6.7 yesterday   Signed, Kathyrn Drown NP-C [place] Pager: [phone] 04/05/2020, 7:24 AM     For questions or updates, please contact   Please consult [email or web address] for contact info under Cardiology/STEMI.  Patient examined chart reviewed Discussed care with NP. MS improved Maintaining NSR on telemetry. Volume control with dialysis F/U echo pending with CE on CXR and reassess EF R/O pericardial effusion. With anemia requiring multiple transfusion and non compliance doubt he is a candidate for long term anticoagulation Change to PO amiodarone in am   Jenkins Rouge MD [facility]

## 2020-04-05 NOTE — Progress Notes (Signed)
  Echocardiogram 2D Echocardiogram has been performed.  Jennette Dubin 04/05/2020, 11:42 AM

## 2020-04-06 ENCOUNTER — Inpatient Hospital Stay (HOSPITAL_COMMUNITY): Payer: Medicare Other

## 2020-04-06 DIAGNOSIS — R451 Restlessness and agitation: Secondary | ICD-10-CM

## 2020-04-06 LAB — CBC WITH DIFFERENTIAL/PLATELET
Abs Immature Granulocytes: 0.19 10*3/uL — ABNORMAL HIGH (ref 0.00–0.07)
Basophils Absolute: 0 10*3/uL (ref 0.0–0.1)
Basophils Relative: 1 %
Eosinophils Absolute: 0.2 10*3/uL (ref 0.0–0.5)
Eosinophils Relative: 3 %
HCT: 27.3 % — ABNORMAL LOW (ref 39.0–52.0)
Hemoglobin: 8.5 g/dL — ABNORMAL LOW (ref 13.0–17.0)
Immature Granulocytes: 2 %
Lymphocytes Relative: 6 %
Lymphs Abs: 0.5 10*3/uL — ABNORMAL LOW (ref 0.7–4.0)
MCH: 32.7 pg (ref 26.0–34.0)
MCHC: 31.1 g/dL (ref 30.0–36.0)
MCV: 105 fL — ABNORMAL HIGH (ref 80.0–100.0)
Monocytes Absolute: 0.9 10*3/uL (ref 0.1–1.0)
Monocytes Relative: 10 %
Neutro Abs: 6.7 10*3/uL (ref 1.7–7.7)
Neutrophils Relative %: 78 %
Platelets: 146 10*3/uL — ABNORMAL LOW (ref 150–400)
RBC: 2.6 MIL/uL — ABNORMAL LOW (ref 4.22–5.81)
RDW: 19.7 % — ABNORMAL HIGH (ref 11.5–15.5)
WBC: 8.6 10*3/uL (ref 4.0–10.5)
nRBC: 0.2 % (ref 0.0–0.2)

## 2020-04-06 LAB — COMPREHENSIVE METABOLIC PANEL
ALT: 18 U/L (ref 0–44)
AST: 22 U/L (ref 15–41)
Albumin: 3.4 g/dL — ABNORMAL LOW (ref 3.5–5.0)
Alkaline Phosphatase: 106 U/L (ref 38–126)
Anion gap: 16 — ABNORMAL HIGH (ref 5–15)
BUN: 40 mg/dL — ABNORMAL HIGH (ref 6–20)
CO2: 26 mmol/L (ref 22–32)
Calcium: 9.2 mg/dL (ref 8.9–10.3)
Chloride: 97 mmol/L — ABNORMAL LOW (ref 98–111)
Creatinine, Ser: 6.97 mg/dL — ABNORMAL HIGH (ref 0.61–1.24)
GFR calc Af Amer: 10 mL/min — ABNORMAL LOW (ref >60)
GFR calc non Af Amer: 8 mL/min — ABNORMAL LOW (ref >60)
Glucose, Bld: 118 mg/dL — ABNORMAL HIGH (ref 70–99)
Potassium: 3.4 mmol/L — ABNORMAL LOW (ref 3.5–5.1)
Sodium: 139 mmol/L (ref 135–145)
Total Bilirubin: 1.6 mg/dL — ABNORMAL HIGH (ref 0.3–1.2)
Total Protein: 7.3 g/dL (ref 6.5–8.1)

## 2020-04-06 LAB — PHOSPHORUS: Phosphorus: 4.9 mg/dL — ABNORMAL HIGH (ref 2.5–4.6)

## 2020-04-06 LAB — AMMONIA: Ammonia: 36 umol/L — ABNORMAL HIGH (ref 9–35)

## 2020-04-06 LAB — MAGNESIUM: Magnesium: 2 mg/dL (ref 1.7–2.4)

## 2020-04-06 IMAGING — DX DG CHEST 1V PORT
1 series · 1 of 1 positions shown · non-contrast
Comparison: Chest radiograph from one day prior.

CLINICAL DATA: Dyspnea

EXAM:
PORTABLE CHEST 1 VIEW

[chest]
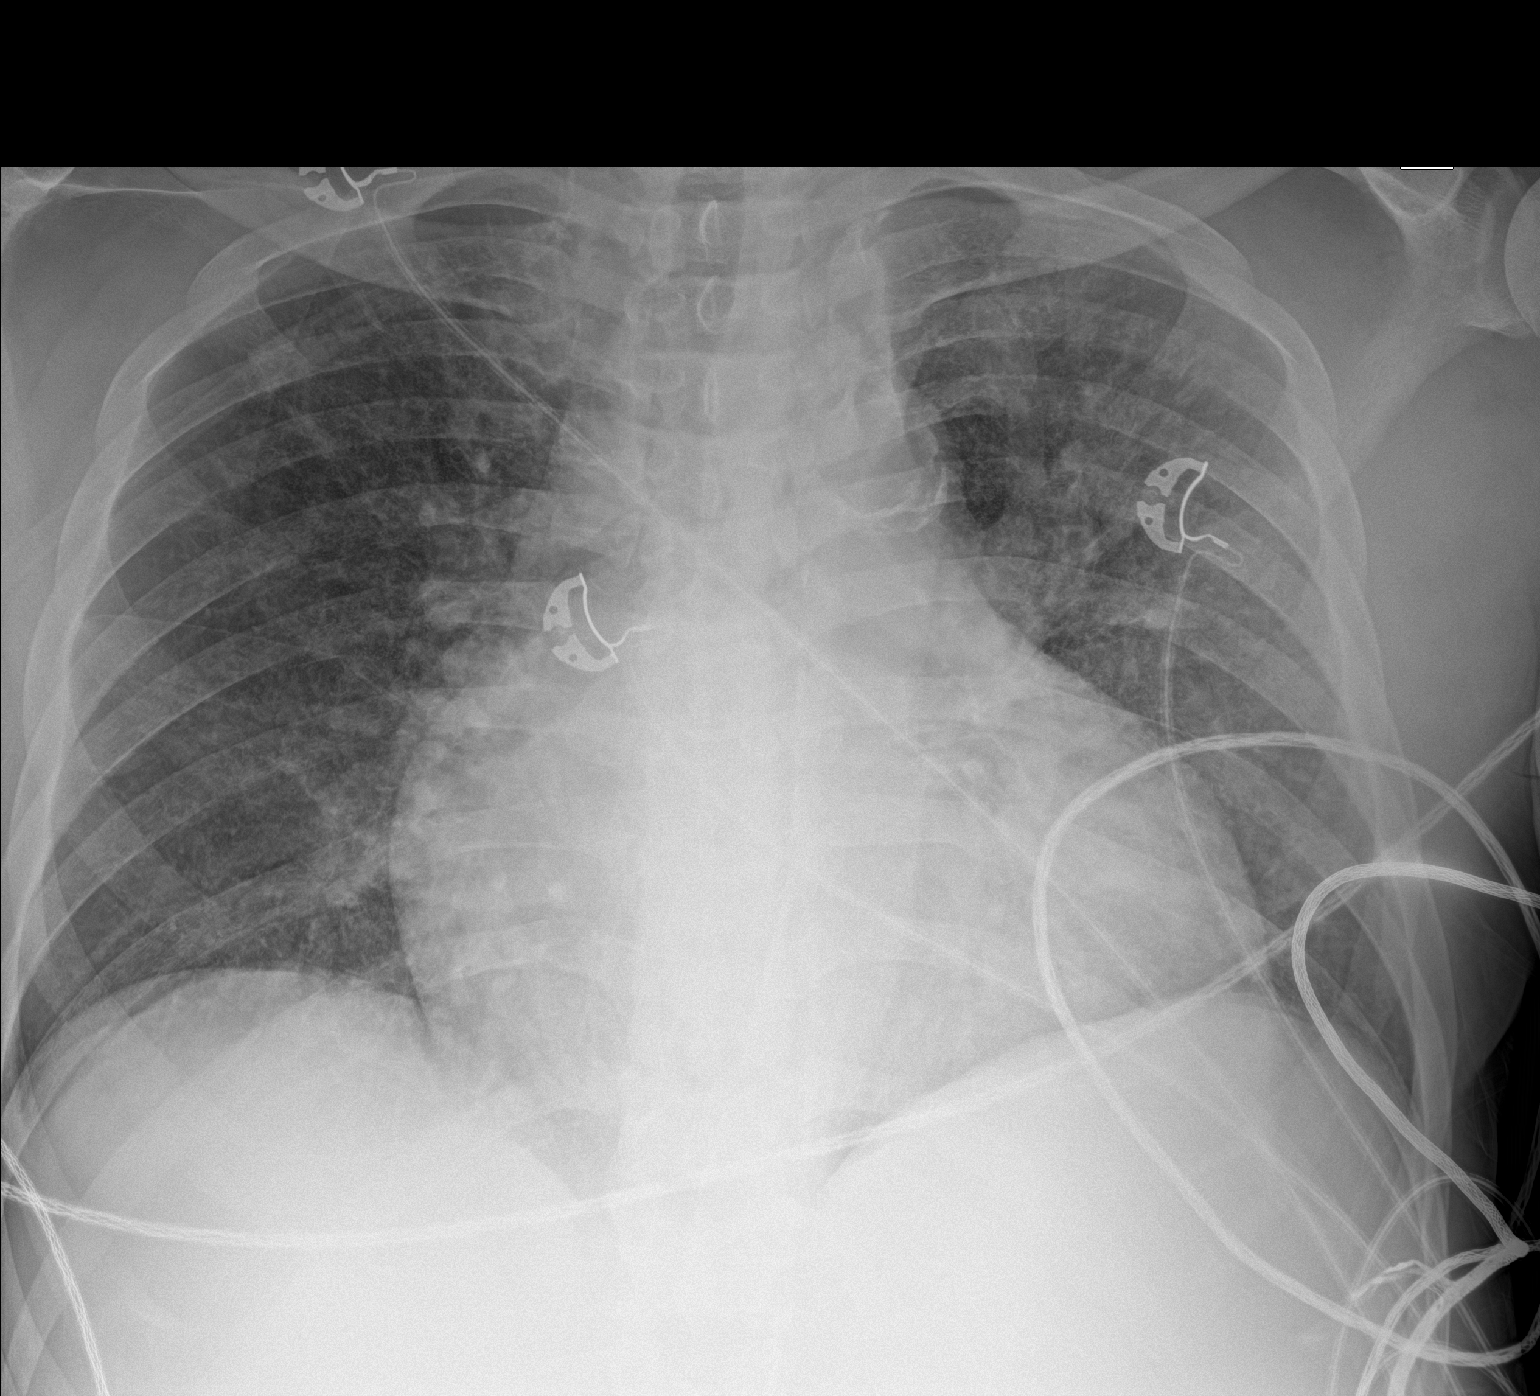

[1 of 1 positions shown; findings below may reference images not displayed]

FINDINGS: Stable cardiomediastinal silhouette with moderate cardiomegaly. No
pneumothorax. No pleural effusion. Mild-to-moderate diffuse
pulmonary edema, not substantially changed.
IMPRESSION: Mild-to-moderate congestive heart failure, not substantially
changed.

## 2020-04-06 MED ORDER — LEVALBUTEROL HCL 0.63 MG/3ML IN NEBU: 0.6300 mg | INHALATION_SOLUTION | Freq: Four times a day (QID) | RESPIRATORY_TRACT | Status: DC | PRN

## 2020-04-06 MED ORDER — IPRATROPIUM BROMIDE 0.02 % IN SOLN: 0.5000 mg | Freq: Four times a day (QID) | RESPIRATORY_TRACT | Status: DC | PRN

## 2020-04-06 MED ORDER — CARVEDILOL 3.125 MG PO TABS
3.1250 mg | ORAL_TABLET | Freq: Two times a day (BID) | ORAL | Status: DC
  Administered 2020-04-06: 3.125 mg via ORAL
  Filled 2020-04-06: qty 1

## 2020-04-06 MED ORDER — AMIODARONE HCL 200 MG PO TABS
200.0000 mg | ORAL_TABLET | Freq: Two times a day (BID) | ORAL | Status: DC
  Administered 2020-04-06 (×2): 200 mg via ORAL
  Filled 2020-04-06 (×2): qty 1

## 2020-04-06 MED ORDER — HEPARIN SODIUM (PORCINE) 5000 UNIT/ML IJ SOLN
5000.0000 [IU] | Freq: Three times a day (TID) | INTRAMUSCULAR | Status: DC
  Administered 2020-04-06 – 2020-04-10 (×9): 5000 [IU] via SUBCUTANEOUS
  Filled 2020-04-06 (×10): qty 1

## 2020-04-06 MED ORDER — HYDRALAZINE HCL 20 MG/ML IJ SOLN: 10.0000 mg | INTRAMUSCULAR | Status: DC | PRN

## 2020-04-06 NOTE — TOC Initial Note (Signed)
Transition of Care [facility]) - Initial/Assessment Note    Patient Details  Name: Jordan [name] MRN: [id number] Date of Birth: 16-Apr-1966  [facility]) CM/SW Contact:    Jacquelynn Cree Phone Number: 04/06/2020, 5:24 PM  Clinical Narrative:                 CSW met with patient bedside. Patient stated PTA he lived alone in a single story residence. CSW spoke with patient regarding PT recommendation of SNF placement at time of discharge. Patient expressed he does not want to go to a SNF and would like to return home. Patient shared he lives in an area that makes him worry about his home being safe with him gone.   CSW inquired on any supports that could assist with him returning home, patient stated his daughter Jordan Hawks [phone] will stay with him. Patient gave CSW permission to contact his daughter. CSW attempted to make contact with daughter while present in the room and was unsuccessful. CSW left a voicemail.   Patient attends dialysis MWF at [facility] in [place]. Patient gets to dialysis by transportation service "[employer]". Patient would like to go home with Home Health at discharge. TOC team will continue to follow.   Expected Discharge Plan: Skilled Nursing Facility Barriers to Discharge: Continued Medical Work up   Patient Goals and CMS Choice   CMS Medicare.gov Compare Post Acute Care list provided to:: Patient Choice offered to / list presented to : Patient  Expected Discharge Plan and Services Expected Discharge Plan: [place] arrangements for the past 2 months: Single Family Home                                      Prior Living Arrangements/Services Living arrangements for the past 2 months: Single Family Home Lives with:: Self Patient language and need for interpreter reviewed:: Yes Do you feel safe going back to the place where you live?: Yes      Need for Family Participation in Patient Care: Yes  (Comment) Care giver support system in place?: No (comment)   Criminal Activity/Legal Involvement Pertinent to Current Situation/Hospitalization: No - Comment as needed  Activities of Daily Living      Permission Sought/Granted Permission sought to share information with : Family Supports Permission granted to share information with : Yes, Verbal Permission Granted  Share Information with NAME: [place]     Permission granted to share info w Relationship: Daughter  Permission granted to share info w Contact Information: [phone]  Emotional Assessment   Attitude/Demeanor/Rapport: Self-Confident Affect (typically observed): Unable to Assess Orientation: : Oriented to Self, Oriented to Place, Oriented to  Time, Oriented to Situation Alcohol / Substance Use: Not Applicable Psych Involvement: No (comment)  Admission diagnosis:  Hyperkalemia [E87.5] Dyspnea [R06.00] Respiratory distress [R06.03] SOB (shortness of breath) [R06.02] Anemia associated with chronic renal failure [N18.9, D63.1] End-stage renal disease on hemodialysis (HCC) [N18.6, Z99.2] Atrial fibrillation with RVR (HCC) [I48.91] Acute on chronic systolic CHF (congestive heart failure) (HCC) [I50.23] Patient Active Problem List   Diagnosis Date Noted  . Atrial fibrillation with RVR ([place]) 04/04/2020  . Acute on chronic systolic CHF (congestive heart failure) ([place]) 04/04/2020  . Non-compliance with renal dialysis ([place]) 03/21/2020  . Acute CHF (congestive heart failure) ([place]) 03/12/2020  . 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  Acute on chronic combined systolic and diastolic CHF (congestive heart failure) ([place]) 02/03/2020  . Pulmonary edema 01/31/2020  . Syncopal episodes 01/24/2020  . Macrocytic anemia 01/24/2020  . Hyperkalemia 01/24/2020  . Fluid overload 11/12/2019  . Acute respiratory failure with hypoxia ([place]) 11/12/2019  . Aortic atherosclerosis ([place]) 11/12/2019  .  Emphysema of lung ([place]) 11/12/2019  . Acute pulmonary edema ([place]) 10/31/2019  . Acute on chronic congestive heart failure ([place]) 10/22/2019  . Acute respiratory distress 10/06/2019  . Volume overload 10/05/2019  . Hypokalemia 10/05/2019  . ESRD (end stage renal disease) ([place]) 10/01/2019  . Leg mass, right   . Hyperbilirubinemia   . Symptomatic anemia 09/19/2019  . Pruritus 09/19/2019  . Hemoptysis 02/11/2019  . Hypercalcemia 01/19/2019  . Elevated troponin 05/17/2018  . Anemia in ESRD (end-stage renal disease) ([place]) 05/17/2018  . Hepatitis B 03/19/2018  . ESRD on dialysis ([place]) 05/26/2017  . Essential hypertension 05/26/2017  . Urinary tract infection, site not specified 10/17/2016  . Coagulation defect, unspecified ([place]) 08/13/2016  . Diarrhea, unspecified 08/13/2016  . Encounter for immunization 08/13/2016  . Headache, unspecified 08/13/2016  . Hypertensive chronic kidney disease with stage 5 chronic kidney disease or end stage renal disease ([place]) 08/13/2016  . Iron deficiency anemia, unspecified 08/13/2016  . Pain, unspecified 08/13/2016  . Moderate protein-calorie malnutrition ([place]) 08/13/2016  . Secondary hyperparathyroidism of renal origin ([place]) 08/13/2016   PCP:  Jearld Fenton, NP Pharmacy:   [facility] [place] [place] [address] Phone: [phone] Fax: [phone]  [facility] [place], [place] - [address], [address] [place] [address] Phone: [phone] Fax: [phone]     Social Determinants of Health (SDOH) Interventions    Readmission Risk Interventions No flowsheet data found.

## 2020-04-06 NOTE — Progress Notes (Addendum)
IV access lost in elevator lobby en route to dialysis; removed by patient despite wearing safety mittens. Accompanied transport to dialysis. Informed dialysis nurse of loss of IV access. Informed dialysis nurse of previously infusing medication, rate, and indication. Dialysis nurse expressed understanding. Patient was in no acute distress when this RN handed off.

## 2020-04-06 NOTE — Progress Notes (Signed)
Patient ID: Jordan [name], male   DOB: 1966/01/28, 54 y.o.   MRN: [id number] [facility] Progress Note   Assessment/ Plan:   1.  Acute hypoxic respiratory failure secondary to pulmonary edema/volume overload: This is due to missed hemodialysis treatments and is improving with ongoing ultrafiltration with hemodialysis here in the hospital. 2. ESRD: Continue hemodialysis on Monday/Wednesday/Friday schedule with hemodialysis at this time.  If he remains in the hospital overnight, I will schedule him again for dialysis tomorrow for additional volume unloading.  Unfortunately, his outpatient course has been significant for notorious nonadherence to dialysis in spite of multiple attempts at reeducation including interventions from the medical social worker at the unit.  Referral to mental health provider was initiated 2 weeks ago and is currently pending. 3. Anemia: Continue ESA while here in the hospital, he has poor attendance at dialysis and therefore does not receive his scheduled ESA doses consistently.  Additionally, azotemia likely contributes to his poor nutritional status/inflammatory complex. 4. CKD-MBD: Calcium and phosphorus levels currently at goal, continue to monitor on low-dose sevelamer. 5.  New onset atrial fibrillation with rapid ventricular response: Seen by cardiology and echocardiogram awaited to rule out pericardial effusion/tamponade.  On amiodarone. 6. Hypertension: Blood pressure within acceptable range, continue to follow with ultrafiltration at hemodialysis.  Subjective:   Confused and agitated overnight pulling out IV access.  Now with protective mittens in place.  When seen in dialysis, he inquires as to why the mittens are there and repeatedly asks about being discharged.   Objective:   BP 110/67 (BP Location: Right Arm)   Pulse 99   Temp 97.6 F (36.4 C) (Oral)   Resp 15   Ht 5\' 4"  (1.626 m)   Wt 71.8 kg   SpO2 100%   BMI 27.17 kg/m   Physical  Exam: Gen: Appears to be comfortable resting in bed, very fidgety with obvious facial swelling CVS: Pulse regular, normal rate, S1 and S2 normal Resp: Anteriorly coarse breath sounds bilaterally without distinct rales/rhonchi Abd: Soft, obese, nontender, bowel sounds Ext: No lower extremity edema, left upper arm AV fistula cannulated at dialysis  Labs: BMET Recent Labs  Lab 04/03/20 1710 04/03/20 1737 04/03/20 2230 04/04/20 0500 04/05/20 0500 04/06/20 0506  NA 140 137 141 140 141 139  K >7.5* 8.2* 4.2 5.3* 4.0 3.4*  CL 102 109 101 100 98 97*  CO2 12*  --  21* 20* 27 26  GLUCOSE 94 89 167* 138* 82 118*  BUN 206* >130* 95* 100* 59* 40*  CREATININE 22.03* >18.00* 10.43* 12.51* 9.01* 6.97*  CALCIUM 9.7  --  9.4 8.9 9.4 9.2  PHOS  --   --  4.4  --  6.1* 4.9*   CBC Recent Labs  Lab 04/03/20 1710 04/03/20 1737 04/03/20 2230 04/04/20 1345 04/05/20 0500 04/06/20 0506  WBC 7.1   < > 7.1 7.5 7.2 8.6  NEUTROABS 6.0  --   --   --  5.7 6.7  HGB 7.1*   < > 6.4* 6.7* 8.3* 8.5*  HCT 25.0*   < > 20.3* 21.9* 26.3* 27.3*  MCV 116.3*   < > 105.7* 106.3* 101.9* 105.0*  PLT 165   < > 135* 127* 126* 146*   < > = values in this interval not displayed.      Medications:    . sodium chloride   Intravenous Once  . amiodarone  200 mg Oral BID  . apixaban  2.5 mg Oral BID  .  carvedilol  3.125 mg Oral BID WC  . Chlorhexidine Gluconate Cloth  6 each Topical Q0600  . Chlorhexidine Gluconate Cloth  6 each Topical Q0600  . folic acid  1 mg Oral Daily  . lactulose  20 g Oral BID  . LORazepam  0-4 mg Intravenous Q4H   Followed by  . [START ON 04/07/2020] LORazepam  0-4 mg Intravenous Q8H  . multivitamin with minerals  1 tablet Oral Daily  . sevelamer carbonate  800 mg Oral TID WC  . sodium chloride flush  3 mL Intravenous Q12H  . thiamine  100 mg Oral Daily   Or  . thiamine  100 mg Intravenous Daily   Elmarie Shiley, MD 04/06/2020, 10:23 AM

## 2020-04-06 NOTE — Progress Notes (Signed)
PROGRESS NOTE    KMARION RAWL  BMW:[id number] DOB: Dec 01, 1965 DOA: 04/03/2020 PCP: Jearld Fenton, NP  Brief Narrative:  The patient is a 54 year old overweight African-American male with a past medical history significant for but not limited to ESRD on hemodialysis, anemia, history of emphysema of the lung, alcohol abuse, hypertension, aortic atherosclerosis as well as other comorbidities presented to the emergency room with shortness of breath after he had missed several dialysis sessions.  He states that he did not want to go and he has had noncompliance with hemodialysis in the past.  At home he had a welfare check and he was found to have shortness of breath and EMS was contacted and he was brought to the hospital.  He denies any fevers or chills and any chest pain but he states that his heart does race quickly sometimes.  He does have a history of volume overload and he states that his appetite has been normal but he is hungry and is not a very good historian.  In the emergency room is found to have volume overload with bilateral rales and leg edema and had increased BUN and creatinine from his baseline as well as hyperkalemia.  He was treated with calcium gluconate, insulin and D50 in the emergency room and underwent urgent dialysis but there were limited to how much fluid they could drop due to his hypotension and he was sent back to the emergency room.  He continued to have shortness of breath and found to be A. fib with RVR and he was placed on BiPAP.  Hospitalist admitted nephrology is planning to dialyze again today and because of his continued hypotension and A. fib with RVR cardiology was consulted for volume overload as well as assistance with heart rate management.  Cardiology started the patient on an amiodarone drip and he spontaneously converted to normal sinus rhythm however then converted back in the atrial fibrillation.  He will remain on amiodarone drip for now and nephrology is  planning on dialyzing the patient again (this will be 4 days in a row).   He was agitated yesterday afternoon and started pulling out his stuff so he was given Lorazepamand started on a CIWA protocol given his history of alcohol abuse.  His ammonia level was also checked and it was elevated so he was given lactulose twice daily  04/06/2020 He was seen in dialysis and his heart rate are still tachycardic in the 140s and irregular.  He remained agitated and was much more confused; we had to had to have a one-to-one sitter ordered.  We will continue to monitor his CIWA protocol and obtain another ammonia level and continue lactulose for now cardiology felt that he remains sinus tachycardic and patient was loaded with IV amiodarone yesterday however he pulled out his IV and they transitioned the patient to p.o.  Cardiology is also add a low-dose beta-blocker.  Will not continue his low-dose Eliquis as a long-term solution given his noncompliance and significant anemia and cardiology feels that he is not currently a candidate for invasive cardiac evaluation.  Cardiology feels that his pericardial effusion is suspected to be uremia related given his renal failure and noncompliance.  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 fib with RVR could be  contributing so cardiology been consulted for assistance with heart rate management and he is placed on a amiodarone drip and this has been changed to p.o. given the patient continues to pull out his IVs; his heart rate spontaneously converted to normal sinus rhythm but now back in A. fib with RVR and now back into normal sinus rhythm but is tachycardic -Continue with volume management with dialysis and appreciate further nephrology and cardiology recommendations patient is to be dialyzed again tonight -To have ultrafiltration with hemodialysis  Acute respiratory failure with hypoxia in the setting of volume overload from acute on chronic combined systolic CHF from missed dialysis -Requiring BiPAP when I saw him and had some leg edema crackles yesterday; today he is back on 6 L but acutely worsened with his respiratory status so had to be placed back on BiPAP and an ABG was done and showed a pH of 7.31 and a PCO2 of 50 -Continue supplemental oxygen via nasal cannula as well as BiPAP as needed -Wean O2 to room air and continue with volume management per dialysis -Continuous pulse oximetry maintain O2 saturations greater than 90% -SpO2: 97 % O2 Flow Rate (L/min): 4 L/min FiO2 (%): 50 %  -Started the patient on Xopenex/Atrovent scheduled -Repeat chest x-ray in the a.m. and will need an ambulatory home O2 screen prior to discharge but continues to remain on oxygen and is very confused today -Patient intermittently pulls off his oxygen and had to be placed on BiPAP yesterday but is off of it today  ESRD on Dialysis [facility]) Metabolic acidosis Hyperphosphatemia -Chronic with poor compliance with dialysis. -Patient's BUN/Cr on Admission was than 130/greater than 18.00 and today it was 40/6.97 -Patient's metabolic acidosis is improving and initially his CO2 was 12, anion gap was 26, chloride level was 109 -Repeat CO2 is now 26, anion gap is 16, chloride level is 97 -Phos level was 6.1 is now trended  down to 4.9 -Avoid nephrotoxic medications, contrast dyes, hypotension and renally adjust medications and further deferment of dialysis to nephrology Patient is likely to be dialyzed again today  Anemia in ESRD (end-stage renal disease) (HCC)/Macrocytic Anemia -Patient with hemoglobin of 6.4 and a hematocrit of 20.3.  -Transfuse 1 unit of packed red blood cells however could not be complete as patient became volume overloaded and more dyspneic  -Patient will need to be dialyzed before further units can be provided to prevent volume overload. -I spoke with nephrology and they will evaluate to see if he needs another unit of PRBCs in the dialysis session ; now his hemoglobin/hematocrit  is stable and improved at 8.5/27.3 -Continue to monitor for signs and symptoms of bleeding; currently no overt bleeding noted For repeat CBC in a.m.  Fluid Overload  -secondary to missed dialysis sessions as above -Appreciate volume management per nephrology recommendations he needs will be dialyzed today seen in dialysis  Essential Hypertension -Patient with hypotension during dialysis and will hold antihypertensives for now. -Continues to be hypotensive and remains on a Cardizem drip next-we will defer to cardiology for further  adjustment  Hyperkalemia  -Patient initially with hyperkalemia potassium of 8.2.  He is now hypokalemic with a potassium level of 3.4  Non-compliance with Renal Dialysis ([facility]) -Patient with chronic noncompliance.  -Will need social work consult to assist with educating patient on importance of dialysis and helping to arrange for any mentions that may prevent future noncompliance  New onset atrial Fibrillation with RVR ([place]) conversion to normal sinus rhythm with sinus tachycardia -Patient given Cardizem 10 mg IV once and will monitor heart rhythm and rate closely. -Patient started on Eliquis 2.5 mg p.o. twice daily but will stop now given cardiology and nephrology  recommendations about him not being a long-term candidate for anticoagulation given his noncompliance and anemia -Have consulted Cardiology for further assistance in management given his uncontrolled heart rates, hypotension and continued volume overload and they have started the patient on an amiodarone drip and will continue the amiodarone drip -Cardiology recommending repeating echocardiogram and this was done and showed left ventricular ejection fraction of 45 to 50% with left ventricle being mildly decreased function and also had left ventricle demonstrated global hypokinesis as well as moderate left ventricular hypertrophy.  Patient's right ventricle systolic function was also mildly reduced; patient did have a small to moderate pericardial effusion measuring up to 1 cm adjacent to left lateral wall -Cardiology was considering transitioning to p.o. amiodarone tomorrow however he is back in A. fib with RVR so we will continue IV amiodarone for now -Patient is not a candidate for long-term chronic anticoagulation given his poor compliance and hemodialysis -Cardiology repeating an echocardiogram to rule out pericardial effusion and he did have a small to moderate pericardial effusion  Pericardial effusion -Cardiology feels that he is not a long-term candidate for anticoagulation due to his noncompliance and significant anemia and pericardial effusion next-they feel that his pericardial effusion is suspected to be uremic given his renal failure noncompliance and there recommending a follow-up echo in 4 weeks to reassess -We will stop his apixaban given his anemia and  Thrombocytopenia -Patient's platelet count from 165 and dropped to 126 at his lowest and is now 146 today -Continue to monitor for signs and symptoms of bleeding; currently no overt bleeding or -Repeat CBC in the a.m.  Confusion and agitation -We will place one-to-one sitter -ABG done yesterday showed a pH of 7.314, PCO2 of 50.9,  and PO2 of 188, bicarbonate level 25.1, and ABG O2 saturation of 99.7% and he was placed on BiPAP and has been weaned off BiPAP and on nasal cannula -May need further neurological work-up but will continue to monitor for signs and symptoms of withdrawal -Continue with delirium precautions and redirection -Consider further brain imaging if not improving and neurology consultation  Agitation and tremors with concern for alcohol withdrawal -Has a history of alcohol abuse -Placed on stepdown CIWA protocol with IV lorazepam and continue with multivitamin, folic acid and thiamine -Continue to monitor for signs and symptoms of withdrawal  Hyperammonemia -Patient is confused and agitated a little bit and could be from withdrawal but will treat his ammonia level as it is 51 and give him some lactulose twice daily -His ammonia level is now 36 and he continues to be significantly altered and confused -May need lactulose enemas  Hyperbilirubinemia -The patient's T bili was 1.9 is now trended down to 1.6 -Continue to monitor and trend and likely this was reactive  DVT prophylaxis: Anticoagulated with Eliquis but this is not a long-term solution and likely will need to be stopped;  will add heparin 5000 units subcu Code Status: FULL CODE   Family Communication: No family present at bedside  Disposition Plan: Pending further clearance by cardiology nephrology and patient being back to his baseline given that he is significantly confused and altered  Status is: Inpatient  Remains inpatient appropriate because:Altered mental status, Unsafe d/c plan, IV treatments appropriate due to intensity of illness or inability to take PO and Inpatient level of care appropriate due to severity of illness   Dispo: The patient is from: Home              Anticipated d/c is to: TBD              Anticipated d/c date is: 2 days              Patient currently is not medically stable to d/c.  Consultants:    Nephrology  Cardiology  Procedures:  ECHOCARDIOGRAM IMPRESSIONS    1. Limited echo to assess for pericardial effusion and LV systolic  function  2. Difficult to assess LVEF given patient was in Afib with RVR during  study, but appears mildly reduced. Left ventricular ejection fraction, by  estimation, is 45 to 50%. The left ventricle has mildly decreased  function. The left ventricle demonstrates  global hypokinesis. There is moderate left ventricular hypertrophy.  3. Right ventricular systolic function is mildly reduced. The right  ventricular size is moderately enlarged. There is moderately elevated  pulmonary artery systolic pressure. The estimated right ventricular  systolic pressure is 50.9 mmHg.  4. Small to moderate pericardial effusion measuring up to 1 cm adjacent  to LV lateral wall. Unable to assess for mitral inflow respiratory  variation given Afib. IVC is fixed/dilated, but no RA/RV collapse is seen  to suggest tamponade.  5. The inferior vena cava is dilated in size with <50% respiratory  variability, suggesting right atrial pressure of 15 mmHg.  6. Mitral regurgitation was not well evaluated on current study, but  noted to have eccentric MR on prior echo that by quantification was severe  (ERO 0.46 cm^2, RV 73cc). Consider TEE or cardiac MRI for further  evaluation   FINDINGS  Left Ventricle: Left ventricular ejection fraction, by estimation, is 45  to 50%. The left ventricle has mildly decreased function. The left  ventricle demonstrates global hypokinesis. The left ventricular internal  cavity size was normal in size. There is  moderate left ventricular hypertrophy.   Right Ventricle: The right ventricular size is moderately enlarged. Right  ventricular systolic function is mildly reduced. There is moderately  elevated pulmonary artery systolic pressure. The tricuspid regurgitant  velocity is 3.11 m/s, and with an  assumed right atrial pressure  of 15 mmHg, the estimated right ventricular  systolic pressure is 32.6 mmHg.   Pericardium: Small to moderate pericardial effusion measuring up to 1 cm  adjacent to LV lateral wall. Unable to assess for mitral inflow  respiratory variation given Afib. IVC is fixed/dilated, but no RA/RV  collapse is seen to suggest tamponade. A small  pericardial effusion is present.   Mitral Valve: Moderate mitral valve regurgitation.   Tricuspid Valve: The tricuspid valve is normal in structure. Tricuspid  valve regurgitation is mild.   Aortic Valve: Aortic valve regurgitation is not visualized. No aortic  stenosis is present.   Aorta: The aortic root is normal in size and structure.   Venous: The inferior vena cava is dilated in size with less than 50%  respiratory variability, suggesting right atrial  pressure of 15 mmHg.   LEFT VENTRICLE  PLAX 2D  LVIDd:     5.20 cm  LVIDs:     3.70 cm  LV PW:     1.50 cm  LV IVS:    1.10 cm     LEFT ATRIUM     Index  LA diam:  4.20 cm 2.36 cm/m    AORTA  Ao Root diam: 3.30 cm   MR Peak grad: 107.7 mmHg TRICUSPID VALVE  MR Vmax:   519.00 cm/s TR Peak grad:  38.7 mmHg              TR Vmax:    311.00 cm/s   Antimicrobials:  Anti-infectives (From admission, onward)   None     Subjective: Seen and examined at bedside in dialysis and he was significantly confused and agitated.  Thought it was the evening and nursing states that he is pulled out multiple lines and keeps pulling off his oxygen.  He states that he feels okay.  No other concerns or complaints at this time but remains confused and agitated.  Objective: Vitals:   04/06/20 1025 04/06/20 1153 04/06/20 1203 04/06/20 1552  BP: 127/68 139/88 (!) 155/109 (!) 157/75  Pulse: 100 (!) 131  97  Resp: 20  (!) 21 15  Temp: 97.8 F (36.6 C) 97.7 F (36.5 C) 97.8 F (36.6 C) (!) 97.4 F (36.3 C)  TempSrc: Oral Oral Oral Oral  SpO2: 98% 100% 100%  97%  Weight: 68.3 kg     Height:        Intake/Output Summary (Last 24 hours) at 04/06/2020 1556 Last data filed at 04/06/2020 1025 Gross per 24 hour  Intake 240 ml  Output 5100 ml  Net -4860 ml   Filed Weights   04/06/20 0457 04/06/20 0712 04/06/20 1025  Weight: 71.5 kg 71.8 kg 68.3 kg   Examination: Physical Exam:  Constitutional: WN/WD overweight African-American male who is currently agitated and appears uncomfortable laying in the dialysis unit getting dialyzed Eyes: Lids and conjunctivae normal, sclerae anicteric  ENMT: External Ears, Nose appear normal. Grossly normal hearing.  Neck: Appears normal, supple, no cervical masses, normal ROM, no appreciable thyromegaly; no JVD Respiratory: Diminished to auscultation bilaterally with coarse, no wheezing, rales, rhonchi or crackles. Normal respiratory effort and patient is not tachypenic. No accessory muscle use.  He is slightly labored breathing and intermittently continues to wear his oxygen Cardiovascular: Tachycardic rate, no murmurs / rubs / gallops. S1 and S2 auscultated.  Has 1+ lower extremity edema.  Abdomen: Soft, non-tender, distended secondary body habitus.  Bowel sounds positive.  GU: Deferred. Musculoskeletal: No clubbing / cyanosis of digits/nails. No joint deformity upper and lower extremities.  Skin: No rashes, lesions, ulcers on limited skin evaluation. No induration; Warm and dry.  Neurologic: CN 2-12 grossly intact with no focal deficits. Romberg sign cerebellar reflexes not assessed.  Psychiatric: Impaired judgment and insight.  He is not alert and oriented x 3.  A confused and agitated mood and appropriate affect.   Data Reviewed: I have personally reviewed following labs and imaging studies  CBC: Recent Labs  Lab 04/03/20 1710 04/03/20 1710 04/03/20 1737 04/03/20 2230 04/04/20 1345 04/05/20 0500 04/06/20 0506  WBC 7.1  --   --  7.1 7.5 7.2 8.6  NEUTROABS 6.0  --   --   --   --  5.7 6.7  HGB 7.1*   <  > 7.8* 6.4* 6.7* 8.3* 8.5*  HCT 25.0*   < >  23.0* 20.3* 21.9* 26.3* 27.3*  MCV 116.3*  --   --  105.7* 106.3* 101.9* 105.0*  PLT 165  --   --  135* 127* 126* 146*   < > = values in this interval not displayed.   Basic Metabolic Panel: Recent Labs  Lab 04/03/20 1710 04/03/20 1710 04/03/20 1737 04/03/20 2230 04/04/20 0500 04/05/20 0500 04/06/20 0506  NA 140   < > 137 141 140 141 139  K >7.5*   < > 8.2* 4.2 5.3* 4.0 3.4*  CL 102   < > 109 101 100 98 97*  CO2 12*  --   --  21* 20* 27 26  GLUCOSE 94   < > 89 167* 138* 82 118*  BUN 206*   < > >130* 95* 100* 59* 40*  CREATININE 22.03*   < > >18.00* 10.43* 12.51* 9.01* 6.97*  CALCIUM 9.7  --   --  9.4 8.9 9.4 9.2  MG  --   --   --   --   --  2.2 2.0  PHOS  --   --   --  4.4  --  6.1* 4.9*   < > = values in this interval not displayed.   GFR: Estimated Creatinine Clearance: 10.4 mL/min (A) (by C-G formula based on SCr of 6.97 mg/dL (H)). Liver Function Tests: Recent Labs  Lab 04/03/20 2230 04/05/20 0500 04/06/20 0506  AST  --  16 22  ALT  --  16 18  ALKPHOS  --  102 106  BILITOT  --  1.9* 1.6*  PROT  --  7.1 7.3  ALBUMIN 3.4* 3.2* 3.4*   No results for input(s): LIPASE, AMYLASE in the last 168 hours. Recent Labs  Lab 04/05/20 1632 04/06/20 1308  AMMONIA 51* 36*   Coagulation Profile: No results for input(s): INR, PROTIME in the last 168 hours. Cardiac Enzymes: No results for input(s): CKTOTAL, CKMB, CKMBINDEX, TROPONINI in the last 168 hours. BNP (last 3 results) No results for input(s): PROBNP in the last 8760 hours. HbA1C: No results for input(s): HGBA1C in the last 72 hours. CBG: No results for input(s): GLUCAP in the last 168 hours. Lipid Profile: No results for input(s): CHOL, HDL, LDLCALC, TRIG, CHOLHDL, LDLDIRECT in the last 72 hours. Thyroid Function Tests: No results for input(s): TSH, T4TOTAL, FREET4, T3FREE, THYROIDAB in the last 72 hours. Anemia Panel: No results for input(s): VITAMINB12, FOLATE,  FERRITIN, TIBC, IRON, RETICCTPCT in the last 72 hours. Sepsis Labs: No results for input(s): PROCALCITON, LATICACIDVEN in the last 168 hours.  Recent Results (from the past 240 hour(s))  SARS Coronavirus 2 by RT PCR (hospital order, performed in [facility] hospital lab) Nasopharyngeal Nasopharyngeal Swab     Status: None   Collection Time: 04/03/20  5:10 PM   Specimen: Nasopharyngeal Swab  Result Value Ref Range Status   SARS Coronavirus 2 NEGATIVE NEGATIVE Final    Comment: (NOTE) SARS-CoV-2 target nucleic acids are NOT DETECTED.  The SARS-CoV-2 RNA is generally detectable in upper and lower respiratory specimens during the acute phase of infection. The lowest concentration of SARS-CoV-2 viral copies this assay can detect is 250 copies / mL. A negative result does not preclude SARS-CoV-2 infection and should not be used as the sole basis for treatment or other patient management decisions.  A negative result may occur with improper specimen collection / handling, submission of specimen other than nasopharyngeal swab, presence of viral mutation(s) within the areas targeted by this assay, and inadequate  number of viral copies (<250 copies / mL). A negative result must be combined with clinical observations, patient history, and epidemiological information.  Fact Sheet for Patients:   StrictlyIdeas.no  Fact Sheet for Healthcare Providers: BankingDealers.co.za  This test is not yet approved or  cleared by the [place] FDA and has been authorized for detection and/or diagnosis of SARS-CoV-2 by FDA under an Emergency Use Authorization (EUA).  This EUA will remain in effect (meaning this test can be used) for the duration of the COVID-19 declaration under Section 564(b)(1) of the Act, 21 U.S.C. section 360bbb-3(b)(1), unless the authorization is terminated or revoked sooner.  Performed at [facility], [place] [address],  [place], [place] [address]      RN Pressure Injury Documentation:     Estimated body mass index is 25.85 kg/m as calculated from the following:   Height as of this encounter: 5\' 4"  (1.626 m).   Weight as of this encounter: 68.3 kg.  Malnutrition Type:      Malnutrition Characteristics:      Nutrition Interventions:    Radiology Studies: DG CHEST PORT 1 VIEW  Result Date: 04/06/2020 CLINICAL DATA:  Dyspnea EXAM: PORTABLE CHEST 1 VIEW COMPARISON:  Chest radiograph from one day prior. FINDINGS: Stable cardiomediastinal silhouette with moderate cardiomegaly. No pneumothorax. No pleural effusion. Mild-to-moderate diffuse pulmonary edema, not substantially changed. IMPRESSION: Mild-to-moderate congestive heart failure, not substantially changed. Electronically Signed   By: Ilona Sorrel M.D.   On: 04/06/2020 09:32   DG CHEST PORT 1 VIEW  Result Date: 04/05/2020 CLINICAL DATA:  Shortness of breath EXAM: PORTABLE CHEST 1 VIEW COMPARISON:  04/04/2020 FINDINGS: No significant change in AP portable examination with cardiomegaly and diffuse bilateral interstitial opacity. No new airspace opacity. The visualized skeletal structures are unremarkable. IMPRESSION: No significant change in AP portable examination with cardiomegaly and diffuse bilateral interstitial opacity, findings consistent with edema. No new airspace opacity. Electronically Signed   By: Eddie Candle M.D.   On: 04/05/2020 15:44   ECHOCARDIOGRAM LIMITED  Result Date: 04/05/2020    ECHOCARDIOGRAM LIMITED REPORT   Patient Name:   ARVINE CLAYBURN Date of Exam: 04/05/2020 Medical Rec #:  [id number]        Height:       64.0 in Accession #:    [id number]       Weight:       160.0 lb Date of Birth:  02-09-1966        BSA:          1.779 m Patient Age:    84 years         BP:           130/85 mmHg Patient Gender: M                HR:           137 bpm. Exam Location:  Inpatient Procedure: Limited Echo, Limited Color Doppler and Cardiac  Doppler Indications:    CHF-Acute Systolic F09.32 Limited for Pericardial Effusion and                 EF.  History:        Patient has prior history of Echocardiogram examinations, most                 recent 01/24/2020. Risk Factors:Hypertension.  Sonographer:    Mikki Santee RDCS (AE) Referring Phys: [place]  1. Limited echo to assess for pericardial  effusion and LV systolic function  2. Difficult to assess LVEF given patient was in Afib with RVR during study, but appears mildly reduced. Left ventricular ejection fraction, by estimation, is 45 to 50%. The left ventricle has mildly decreased function. The left ventricle demonstrates global hypokinesis. There is moderate left ventricular hypertrophy.  3. Right ventricular systolic function is mildly reduced. The right ventricular size is moderately enlarged. There is moderately elevated pulmonary artery systolic pressure. The estimated right ventricular systolic pressure is 16.1 mmHg.  4. Small to moderate pericardial effusion measuring up to 1 cm adjacent to LV lateral wall. Unable to assess for mitral inflow respiratory variation given Afib. IVC is fixed/dilated, but no RA/RV collapse is seen to suggest tamponade.  5. The inferior vena cava is dilated in size with <50% respiratory variability, suggesting right atrial pressure of 15 mmHg.  6. Mitral regurgitation was not well evaluated on current study, but noted to have eccentric MR on prior echo that by quantification was severe (ERO 0.46 cm^2, RV 73cc). Consider TEE or cardiac MRI for further evaluation FINDINGS  Left Ventricle: Left ventricular ejection fraction, by estimation, is 45 to 50%. The left ventricle has mildly decreased function. The left ventricle demonstrates global hypokinesis. The left ventricular internal cavity size was normal in size. There is  moderate left ventricular hypertrophy. Right Ventricle: The right ventricular size is moderately enlarged. Right  ventricular systolic function is mildly reduced. There is moderately elevated pulmonary artery systolic pressure. The tricuspid regurgitant velocity is 3.11 m/s, and with an assumed right atrial pressure of 15 mmHg, the estimated right ventricular systolic pressure is 09.6 mmHg. Pericardium: Small to moderate pericardial effusion measuring up to 1 cm adjacent to LV lateral wall. Unable to assess for mitral inflow respiratory variation given Afib. IVC is fixed/dilated, but no RA/RV collapse is seen to suggest tamponade. A small pericardial effusion is present. Mitral Valve: Moderate mitral valve regurgitation. Tricuspid Valve: The tricuspid valve is normal in structure. Tricuspid valve regurgitation is mild. Aortic Valve: Aortic valve regurgitation is not visualized. No aortic stenosis is present. Aorta: The aortic root is normal in size and structure. Venous: The inferior vena cava is dilated in size with less than 50% respiratory variability, suggesting right atrial pressure of 15 mmHg. LEFT VENTRICLE PLAX 2D LVIDd:         5.20 cm LVIDs:         3.70 cm LV PW:         1.50 cm LV IVS:        1.10 cm  LEFT ATRIUM         Index LA diam:    4.20 cm 2.36 cm/m   AORTA Ao Root diam: 3.30 cm MR Peak grad: 107.7 mmHg  TRICUSPID VALVE MR Vmax:      519.00 cm/s TR Peak grad:   38.7 mmHg                           TR Vmax:        311.00 cm/s Oswaldo Milian MD Electronically signed by Oswaldo Milian MD Signature Date/Time: 04/05/2020/6:18:34 PM    Final    Scheduled Meds: . sodium chloride   Intravenous Once  . amiodarone  200 mg Oral BID  . apixaban  2.5 mg Oral BID  . carvedilol  3.125 mg Oral BID WC  . Chlorhexidine Gluconate Cloth  6 each Topical Q0600  . Chlorhexidine Gluconate Cloth  6 each Topical  [id number]  . folic acid  1 mg Oral Daily  . lactulose  20 g Oral BID  . LORazepam  0-4 mg Intravenous Q4H   Followed by  . [START ON 04/07/2020] LORazepam  0-4 mg Intravenous Q8H  . multivitamin with  minerals  1 tablet Oral Daily  . sevelamer carbonate  800 mg Oral TID WC  . sodium chloride flush  3 mL Intravenous Q12H  . thiamine  100 mg Oral Daily   Or  . thiamine  100 mg Intravenous Daily   Continuous Infusions: . sodium chloride    . sodium chloride      LOS: 2 days   Kerney Elbe, DO [facility] PAGER is on [place]  If 7PM-7AM, please contact night-coverage [email or web address]

## 2020-04-06 NOTE — Progress Notes (Addendum)
Progress Note  Patient Name: Jordan [name] Date of Encounter: 04/06/2020  Primary Cardiologist: New to [facility]   Subjective   Seen in HD. Confused and agitated today. Maintaining ST   Inpatient Medications    Scheduled Meds:  sodium chloride   Intravenous Once   apixaban  2.5 mg Oral BID   Chlorhexidine Gluconate Cloth  6 each Topical Q0600   Chlorhexidine Gluconate Cloth  6 each Topical E7035   folic acid  1 mg Oral Daily   ipratropium  0.5 mg Nebulization BID   lactulose  20 g Oral BID   levalbuterol  0.63 mg Nebulization BID   LORazepam  0-4 mg Intravenous Q4H   Followed by   Derrill Memo ON 04/07/2020] LORazepam  0-4 mg Intravenous Q8H   multivitamin with minerals  1 tablet Oral Daily   patiromer  8.4 g Oral Daily   sevelamer carbonate  800 mg Oral TID WC   sodium chloride flush  3 mL Intravenous Q12H   thiamine  100 mg Oral Daily   Or   thiamine  100 mg Intravenous Daily   Continuous Infusions:  sodium chloride     sodium chloride     amiodarone 30 mg/hr (04/06/20 0022)   PRN Meds: sodium chloride, acetaminophen, camphor-menthol, heparin, hydrALAZINE, LORazepam **OR** LORazepam, ondansetron (ZOFRAN) IV, sodium chloride flush   Vital Signs    Vitals:   04/06/20 0121 04/06/20 0300 04/06/20 0350 04/06/20 0457  BP:   (!) 135/98   Pulse:      Resp:   20   Temp:   98 F (36.7 C)   TempSrc:   Oral   SpO2:  99% 97%   Weight: 69.8 kg   71.5 kg  Height:        Intake/Output Summary (Last 24 hours) at 04/06/2020 0744 Last data filed at 04/06/2020 0022 Gross per 24 hour  Intake 240 ml  Output 2100 ml  Net -1860 ml   Filed Weights   04/05/20 0456 04/06/20 0121 04/06/20 0457  Weight: 72.6 kg 69.8 kg 71.5 kg    Physical Exam   General: Ill appearing, NAD Neck: Negative for carotid bruits. No JVD Lungs:Clear to ausculation bilaterally. Breathing is unlabored. Cardiovascular: RRR with S1 S2. No murmurs Abdomen: Soft, non-tender, non-distended. No obvious  abdominal masses. Extremities: No edema. Radial pulses 2+ bilaterally Neuro: Alert and oriented. No focal deficits. No facial asymmetry. MAE spontaneously. Psych: Responds to questions appropriately with normal affect.    Labs    Chemistry Recent Labs  Lab 04/03/20 2230 04/03/20 2230 04/04/20 0500 04/05/20 0500 04/06/20 0506  NA 141   < > 140 141 139  K 4.2   < > 5.3* 4.0 3.4*  CL 101   < > 100 98 97*  CO2 21*   < > 20* 27 26  GLUCOSE 167*   < > 138* 82 118*  BUN 95*   < > 100* 59* 40*  CREATININE 10.43*   < > 12.51* 9.01* 6.97*  CALCIUM 9.4   < > 8.9 9.4 9.2  PROT  --   --   --  7.1 7.3  ALBUMIN 3.4*  --   --  3.2* 3.4*  AST  --   --   --  16 22  ALT  --   --   --  16 18  ALKPHOS  --   --   --  102 106  BILITOT  --   --   --  1.9* 1.6*  GFRNONAA 5*   < > 4* 6* 8*  GFRAA 6*   < > 5* 7* 10*  ANIONGAP 19*   < > 20* 16* 16*   < > = values in this interval not displayed.     Hematology Recent Labs  Lab 04/04/20 1345 04/05/20 0500 04/06/20 0506  WBC 7.5 7.2 8.6  RBC 2.06* 2.58* 2.60*  HGB 6.7* 8.3* 8.5*  HCT 21.9* 26.3* 27.3*  MCV 106.3* 101.9* 105.0*  MCH 32.5 32.2 32.7  MCHC 30.6 31.6 31.1  RDW 19.1* 20.8* 19.7*  PLT 127* 126* 146*    Cardiac EnzymesNo results for input(s): TROPONINI in the last 168 hours. No results for input(s): TROPIPOC in the last 168 hours.   BNPNo results for input(s): BNP, PROBNP in the last 168 hours.   DDimer No results for input(s): DDIMER in the last 168 hours.   Radiology    DG CHEST PORT 1 VIEW  Result Date: 04/05/2020 CLINICAL DATA:  Shortness of breath EXAM: PORTABLE CHEST 1 VIEW COMPARISON:  04/04/2020 FINDINGS: No significant change in AP portable examination with cardiomegaly and diffuse bilateral interstitial opacity. No new airspace opacity. The visualized skeletal structures are unremarkable. IMPRESSION: No significant change in AP portable examination with cardiomegaly and diffuse bilateral interstitial opacity,  findings consistent with edema. No new airspace opacity. Electronically Signed   By: Eddie Candle M.D.   On: 04/05/2020 15:44   DG CHEST PORT 1 VIEW  Result Date: 04/04/2020 CLINICAL DATA:  Dyspnea EXAM: PORTABLE CHEST 1 VIEW COMPARISON:  04/03/2020 FINDINGS: Moderate cardiomegaly. Atherosclerotic calcification of the aortic knob. Pulmonary vascular congestion with diffuse bilateral interstitial opacities suggesting pulmonary edema. No large pleural fluid collection. No pneumothorax. IMPRESSION: Cardiomegaly with pulmonary vascular congestion and diffuse bilateral interstitial opacities suggesting pulmonary edema. Electronically Signed   By: Davina Poke D.O.   On: 04/04/2020 14:06   ECHOCARDIOGRAM LIMITED  Result Date: 04/05/2020    ECHOCARDIOGRAM LIMITED REPORT   Patient Name:   Jordan [name] Date of Exam: 04/05/2020 Medical Rec #:  [id number]        Height:       64.0 in Accession #:    [id number]       Weight:       160.0 lb Date of Birth:  1966-02-11        BSA:          1.779 m Patient Age:    54 years         BP:           130/85 mmHg Patient Gender: M                HR:           137 bpm. Exam Location:  Inpatient Procedure: Limited Echo, Limited Color Doppler and Cardiac Doppler Indications:    CHF-Acute Systolic C37.62 Limited for Pericardial Effusion and                 EF.  History:        Patient has prior history of Echocardiogram examinations, most                 recent 01/24/2020. Risk Factors:Hypertension.  Sonographer:    Mikki Santee RDCS (AE) Referring Phys: [address]  1. Limited echo to assess for pericardial effusion and LV systolic function  2. Difficult to assess LVEF given patient was in Afib with RVR during study, but appears  mildly reduced. Left ventricular ejection fraction, by estimation, is 45 to 50%. The left ventricle has mildly decreased function. The left ventricle demonstrates global hypokinesis. There is moderate left ventricular  hypertrophy.  3. Right ventricular systolic function is mildly reduced. The right ventricular size is moderately enlarged. There is moderately elevated pulmonary artery systolic pressure. The estimated right ventricular systolic pressure is 45.3 mmHg.  4. Small to moderate pericardial effusion measuring up to 1 cm adjacent to LV lateral wall. Unable to assess for mitral inflow respiratory variation given Afib. IVC is fixed/dilated, but no RA/RV collapse is seen to suggest tamponade.  5. The inferior vena cava is dilated in size with <50% respiratory variability, suggesting right atrial pressure of 15 mmHg.  6. Mitral regurgitation was not well evaluated on current study, but noted to have eccentric MR on prior echo that by quantification was severe (ERO 0.46 cm^2, RV 73cc). Consider TEE or cardiac MRI for further evaluation FINDINGS  Left Ventricle: Left ventricular ejection fraction, by estimation, is 45 to 50%. The left ventricle has mildly decreased function. The left ventricle demonstrates global hypokinesis. The left ventricular internal cavity size was normal in size. There is  moderate left ventricular hypertrophy. Right Ventricle: The right ventricular size is moderately enlarged. Right ventricular systolic function is mildly reduced. There is moderately elevated pulmonary artery systolic pressure. The tricuspid regurgitant velocity is 3.11 m/s, and with an assumed right atrial pressure of 15 mmHg, the estimated right ventricular systolic pressure is 64.6 mmHg. Pericardium: Small to moderate pericardial effusion measuring up to 1 cm adjacent to LV lateral wall. Unable to assess for mitral inflow respiratory variation given Afib. IVC is fixed/dilated, but no RA/RV collapse is seen to suggest tamponade. A small pericardial effusion is present. Mitral Valve: Moderate mitral valve regurgitation. Tricuspid Valve: The tricuspid valve is normal in structure. Tricuspid valve regurgitation is mild. Aortic Valve:  Aortic valve regurgitation is not visualized. No aortic stenosis is present. Aorta: The aortic root is normal in size and structure. Venous: The inferior vena cava is dilated in size with less than 50% respiratory variability, suggesting right atrial pressure of 15 mmHg. LEFT VENTRICLE PLAX 2D LVIDd:         5.20 cm LVIDs:         3.70 cm LV PW:         1.50 cm LV IVS:        1.10 cm  LEFT ATRIUM         Index LA diam:    4.20 cm 2.36 cm/m   AORTA Ao Root diam: 3.30 cm MR Peak grad: 107.7 mmHg  TRICUSPID VALVE MR Vmax:      519.00 cm/s TR Peak grad:   38.7 mmHg                           TR Vmax:        311.00 cm/s Oswaldo Milian MD Electronically signed by Oswaldo Milian MD Signature Date/Time: 04/05/2020/6:18:34 PM    Final    Telemetry    04/06/20 ST with HRs in the 130's - Personally Reviewed  ECG    No new tracing as of 04/06/20- Personally Reviewed  Cardiac Studies   Echocardiogram 04/05/2020:    1. Limited echo to assess for pericardial effusion and LV systolic  function   2. Difficult to assess LVEF given patient was in Afib with RVR during  study, but appears mildly reduced. Left ventricular ejection  fraction, by  estimation, is 45 to 50%. The left ventricle has mildly decreased  function. The left ventricle demonstrates  global hypokinesis. There is moderate left ventricular hypertrophy.   3. Right ventricular systolic function is mildly reduced. The right  ventricular size is moderately enlarged. There is moderately elevated  pulmonary artery systolic pressure. The estimated right ventricular  systolic pressure is 32.4 mmHg.   4. Small to moderate pericardial effusion measuring up to 1 cm adjacent  to LV lateral wall. Unable to assess for mitral inflow respiratory  variation given Afib. IVC is fixed/dilated, but no RA/RV collapse is seen  to suggest tamponade.   5. The inferior vena cava is dilated in size with <50% respiratory  variability, suggesting right atrial  pressure of 15 mmHg.   6. Mitral regurgitation was not well evaluated on current study, but  noted to have eccentric MR on prior echo that by quantification was severe  (ERO 0.46 cm^2, RV 73cc). Consider TEE or cardiac MRI for further  evaluation      Echocardiogram 01/24/2020:   1. Left ventricular ejection fraction, by estimation, is 45 to 50%. The  left ventricle has mildly decreased function. The left ventricle  demonstrates global hypokinesis. There is moderate left ventricular  hypertrophy. Left ventricular diastolic  parameters are consistent with Grade III diastolic dysfunction  (restrictive). Elevated left ventricular end-diastolic pressure.   2. Right ventricular systolic function is normal. The right ventricular  size is normal. There is severely elevated pulmonary artery systolic  pressure.   3. Left atrial size was severely dilated.   4. Right atrial size was severely dilated.   5. The mitral valve is grossly normal. Mild mitral valve regurgitation.  No evidence of mitral stenosis.   6. Tricuspid valve regurgitation is moderate.   7. The aortic valve is tricuspid. Aortic valve regurgitation is not  visualized. Mild aortic valve sclerosis is present, with no evidence of  aortic valve stenosis.   8. The inferior vena cava is dilated in size with <50% respiratory  variability, suggesting right atrial pressure of 15 mmHg.   Comparison(s): No prior Echocardiogram.   Patient Profile     54 y.o. male with a hx of ESRD noncompliant with HD, anemia of chronic kidney disease, recurrent hyperkalemia, secondary hyperparathyroidism, history of EtOH and tobacco abuse who is being seen today for the evaluation of new onset atrial fibrillation at the request of Dr. Alfredia Ferguson.  Assessment & Plan    1. New onset AF with RVR: -Started on low-dose Eliquis per primary team however suspect is not a candidate for chronic long-term anticoagulation given history of poor compliance and  hemodialysis -Plan to repeat echocardiogram to rule out pericardial effusion>>>EF noted to be 45-50% with mildly decreased diastolic functio, global hypokinesis, moderately elevated PA pressure at 80mmHg with small to moderate pericardial effusion with no RA/RV collapse to indicate cardiac tamponade. Mitral regurgitation was not well evaluated on current study, but noted to have eccentric MR on prior echo that by quantification was severe>>>consider TEE or cardiac MRI for further  evaluation  -Patient loaded with IV amiodarone infusion to help maintain NSR>>however pt pulled out IV OTW to HD>>>weill transition to PO today>>200mg  PO BID x two weeks then 200mg  PO QD thereafter if MD agrees  -Maintaining SR/ST with rates in the 120-130's. -Will add low dose BB    2. Acute respiratory failure: -Stabilized, currently maintaining adequate saturations on nasal cannula   3.  Acute on chronic combined systolic and diastolic  CHF: -Prior echocardiogram from May 2021 with LVEF of 45 to 50%>> cardiology service not consulted at that time -Given history of noncompliance, likely poor candidate for further invasive study -Fuid volume managed with HD>>in HD today     4.  Hyperkalemia/hypokalemia: -K+, 3.4 today  -Replacement with HD   5.  Anemia of chronic disease: -Stabilizing>>hemoglobin, 8.5 this a.m. up from 6.7 yesterday   Signed, Kathyrn Drown NP-C [place] Pager: [phone] 04/06/2020, 7:44 AM     For questions or updates, please contact   Please consult [email or web address] for contact info under Cardiology/STEMI.  Patient examined chart reviewed. He is in NSR with amiodarone Ok to change to PO given MS issues and patient pulling iv's out Primary service has started him on low dose eliquis but doubt he is a long term candidate for anticoagulation due to non compliance and significant anemia.and pericardial effusion  Agree he is currently not a candidate for invasive cardiac evaluation Volume  status controlled with dialysis will need f/u echo in 4 weeks for pericardial effusion suspect uremic given renal failure and non compliance   Jenkins Rouge MD [employer] [facility]

## 2020-04-06 NOTE — Progress Notes (Signed)
PT Cancellation Note  Patient Details Name: Jordan [name] MRN: [id number] DOB: 02/15/1966   Cancelled Treatment:    Reason Eval/Treat Not Completed: Patient at procedure or test/unavailable (Pt in HD. Will check back as time allows.  )   Denice Paradise 04/06/2020, 8:58 AM  Jordan Nicolson W,PT Acute Rehabilitation Services Pager:  [phone]  Office:  [phone]

## 2020-04-06 NOTE — Procedures (Signed)
Patient seen on Hemodialysis. BP 110/67 (BP Location: Right Arm)   Pulse 99   Temp 97.6 F (36.4 C) (Oral)   Resp 15   Ht 5\' 4"  (1.626 m)   Wt 71.8 kg   SpO2 100%   BMI 27.17 kg/m   QB 400, UF goal 3L Tolerating treatment without complaints at this time. Lamenting that he has protective mittens on and asking repeatedly to go home- discussed. JP  Elmarie Shiley MD [facility]. Office # [phone] 10:11 AM

## 2020-04-06 NOTE — Progress Notes (Signed)
Pt is still on 4L [place].  Bipap on standby if needed.  Pt is oriented and no distress noted at this time.

## 2020-04-06 NOTE — Evaluation (Signed)
Physical Therapy Evaluation Patient Details Name: Jordan [name] MRN: [id number] DOB: [birth year] Today's Date: 04/06/2020   History of Present Illness  54 y.o. male with a hx of ESRD noncompliant with HD,anemia of chronic kidney disease, recurrent hyperkalemia, secondary hyperparathyroidism, history of EtOH and tobacco abusewho is being seen today for the evaluation of new onset atrial fibrillationat the request of Dr. Alfredia Ferguson.  Clinical Impression  Pt admitted with above diagnosis. Pt was able to sit EOB and do some exercise with supervision. Mod assist to stand with bil UE support. Limited by [employer].  O2 sat not reading well on 4L.  HR incr to 138 bpm with sitting. Limited eval due to pt agitation and lethargy. Will most likely need SNF.  Will follow acutely.  Pt currently with functional limitations due to the deficits listed below (see PT Problem List). Pt will benefit from skilled PT to increase their independence and safety with mobility to allow discharge to the venue listed below.   Follow Up Recommendations SNF;Supervision/Assistance - 24 hour    Equipment Recommendations  Rolling walker with 5" wheels;3in1 (PT)    Recommendations for Other Services       Precautions / Restrictions Precautions Precautions: Fall Restrictions Weight Bearing Restrictions: No      Mobility  Bed Mobility Overal bed mobility: Needs Assistance Bed Mobility: Supine to Sit     Supine to sit: Min assist     General bed mobility comments: assist due to pt impulsive and lethargic. use d the rail to sit up  Transfers Overall transfer level: Needs assistance Equipment used: 2 person hand held assist Transfers: Sit to/from Stand Sit to Stand: Mod assist;From elevated surface         General transfer comment: Needed assist to power up and steady once up and pt with posterior lean and just generally unsteady on feet. Had BM therefore cleaned pt and changed linens and gown. Pt could not take  steps to [facility] even though PT tried to encourage him to do so.   Ambulation/Gait             General Gait Details: Pt unable due to lethargy  Stairs            Wheelchair Mobility    Modified Rankin (Stroke Patients Only)       Balance Overall balance assessment: Needs assistance Sitting-balance support: No upper extremity supported;Feet supported Sitting balance-Leahy Scale: Fair Sitting balance - Comments: Removed mitts so that pt could eat his applesauce and he was able to feed himself with right hand and do some LE exercise in sitting   Standing balance support: Bilateral upper extremity supported;During functional activity Standing balance-Leahy Scale: Poor Standing balance comment: relies on bil UE support                             Pertinent Vitals/Pain Pain Assessment: No/denies pain    Home Living Family/patient expects to be discharged to:: Private residence Living Arrangements: Alone Available Help at Discharge: Family;Available PRN/intermittently Type of Home: House Home Access: Stairs to enter   [employer] of Steps: 4 Home Layout: One level Home Equipment: None      Prior Function Level of Independence: Independent               Hand Dominance   Dominant Hand: Right    Extremity/Trunk Assessment   Upper Extremity Assessment Upper Extremity Assessment: Defer to OT evaluation  Lower Extremity Assessment Lower Extremity Assessment: Generalized weakness    Cervical / Trunk Assessment Cervical / Trunk Assessment: Normal  Communication   Communication: No difficulties  Cognition Arousal/Alertness: Lethargic Behavior During Therapy: Flat affect Overall Cognitive Status: Impaired/Different from baseline                                 General Comments: Difficult to assess due to pt lethargy. Appears confused saying he lived with his brother intiially and then stating his brother is deceased  5 min later.       General Comments      Exercises General Exercises - Lower Extremity Ankle Circles/Pumps: AROM;Both;10 reps;Seated Long Arc Quad: AROM;Both;10 reps;Seated Hip Flexion/Marching: AROM;Both;10 reps;Seated   Assessment/Plan    PT Assessment Patient needs continued PT services  PT Problem List Decreased range of motion;Decreased activity tolerance;Decreased mobility;Decreased cognition       PT Treatment Interventions Gait training;Stair training;Functional mobility training;Therapeutic activities;Therapeutic exercise;Patient/family education    PT Goals (Current goals can be found in the Care Plan section)  Acute Rehab PT Goals Patient Stated Goal: unable to state PT Goal Formulation: Patient unable to participate in goal setting Time For Goal Achievement: 04/20/20 Potential to Achieve Goals: Fair    Frequency Min 2X/week   Barriers to discharge Inaccessible home environment      Co-evaluation               AM-PAC PT "6 Clicks" Mobility  Outcome Measure Help needed turning from your back to your side while in a flat bed without using bedrails?: A Little Help needed moving from lying on your back to sitting on the side of a flat bed without using bedrails?: A Little Help needed moving to and from a bed to a chair (including a wheelchair)?: A Little Help needed standing up from a chair using your arms (e.g., wheelchair or bedside chair)?: A Lot Help needed to walk in hospital room?: A Lot Help needed climbing 3-5 steps with a railing? : A Lot 6 Click Score: 15    End of Session Equipment Utilized During Treatment: Gait belt;Oxygen (on 4LO2) Activity Tolerance: Patient limited by fatigue Patient left: with call bell/phone within reach;in bed;with bed alarm set;with nursing/sitter in room;with restraints reapplied Nurse Communication: Mobility status PT Visit Diagnosis: Difficulty in walking, not elsewhere classified (R26.2);Other abnormalities of  gait and mobility (R26.89);Other symptoms and signs involving the nervous system (R29.898)    Time: 0488-8916 PT Time Calculation (min) (ACUTE ONLY): 28 min   Charges:   PT Evaluation $PT Eval Moderate Complexity: 1 Mod PT Treatments $Gait Training: 8-22 mins        Coni Homesley W,PT Acute Rehabilitation Services Pager:  [phone]  Office:  [place] 04/06/2020, 3:42 PM

## 2020-04-06 NOTE — Progress Notes (Signed)
Pt is confused and agitated , pt goes into deep naps then wakes up very agitated , Pt has pulled out multiple IV's , continuously removes oxygen causing destats into the 70's as well as the heart monitor and safety mittens. MD has been Scientist, clinical (histocompatibility and immunogenetics) ordered  Will continue to monitor

## 2020-04-07 ENCOUNTER — Inpatient Hospital Stay (HOSPITAL_COMMUNITY): Payer: Medicare Other

## 2020-04-07 ENCOUNTER — Inpatient Hospital Stay (HOSPITAL_COMMUNITY): Payer: Medicare Other | Admitting: Certified Registered"

## 2020-04-07 DIAGNOSIS — R092 Respiratory arrest: Secondary | ICD-10-CM

## 2020-04-07 DIAGNOSIS — J9601 Acute respiratory failure with hypoxia: Secondary | ICD-10-CM

## 2020-04-07 LAB — POCT I-STAT 7, (LYTES, BLD GAS, ICA,H+H)
Acid-Base Excess: 6 mmol/L — ABNORMAL HIGH (ref 0.0–2.0)
Bicarbonate: 33.5 mmol/L — ABNORMAL HIGH (ref 20.0–28.0)
Calcium, Ion: 1.26 mmol/L (ref 1.15–1.40)
HCT: 27 % — ABNORMAL LOW (ref 39.0–52.0)
Hemoglobin: 9.2 g/dL — ABNORMAL LOW (ref 13.0–17.0)
O2 Saturation: 97 %
Patient temperature: 98.6
Potassium: 3.8 mmol/L (ref 3.5–5.1)
Sodium: 139 mmol/L (ref 135–145)
TCO2: 35 mmol/L — ABNORMAL HIGH (ref 22–32)
pCO2 arterial: 64.8 mmHg — ABNORMAL HIGH (ref 32.0–48.0)
pH, Arterial: 7.321 — ABNORMAL LOW (ref 7.350–7.450)
pO2, Arterial: 104 mmHg (ref 83.0–108.0)

## 2020-04-07 LAB — CBC WITH DIFFERENTIAL/PLATELET
Abs Immature Granulocytes: 0.22 10*3/uL — ABNORMAL HIGH (ref 0.00–0.07)
Basophils Absolute: 0.1 10*3/uL (ref 0.0–0.1)
Basophils Relative: 1 %
Eosinophils Absolute: 0.3 10*3/uL (ref 0.0–0.5)
Eosinophils Relative: 3 %
HCT: 30.4 % — ABNORMAL LOW (ref 39.0–52.0)
Hemoglobin: 8.8 g/dL — ABNORMAL LOW (ref 13.0–17.0)
Immature Granulocytes: 2 %
Lymphocytes Relative: 11 %
Lymphs Abs: 1.1 10*3/uL (ref 0.7–4.0)
MCH: 31.5 pg (ref 26.0–34.0)
MCHC: 28.9 g/dL — ABNORMAL LOW (ref 30.0–36.0)
MCV: 109 fL — ABNORMAL HIGH (ref 80.0–100.0)
Monocytes Absolute: 0.9 10*3/uL (ref 0.1–1.0)
Monocytes Relative: 8 %
Neutro Abs: 7.8 10*3/uL — ABNORMAL HIGH (ref 1.7–7.7)
Neutrophils Relative %: 75 %
Platelets: 188 10*3/uL (ref 150–400)
RBC: 2.79 MIL/uL — ABNORMAL LOW (ref 4.22–5.81)
RDW: 19.2 % — ABNORMAL HIGH (ref 11.5–15.5)
WBC: 10.4 10*3/uL (ref 4.0–10.5)
nRBC: 0.6 % — ABNORMAL HIGH (ref 0.0–0.2)

## 2020-04-07 LAB — COMPREHENSIVE METABOLIC PANEL
ALT: 18 U/L (ref 0–44)
AST: 20 U/L (ref 15–41)
Albumin: 3.5 g/dL (ref 3.5–5.0)
Alkaline Phosphatase: 128 U/L — ABNORMAL HIGH (ref 38–126)
Anion gap: 13 (ref 5–15)
BUN: 32 mg/dL — ABNORMAL HIGH (ref 6–20)
CO2: 29 mmol/L (ref 22–32)
Calcium: 9.6 mg/dL (ref 8.9–10.3)
Chloride: 97 mmol/L — ABNORMAL LOW (ref 98–111)
Creatinine, Ser: 6.5 mg/dL — ABNORMAL HIGH (ref 0.61–1.24)
GFR calc Af Amer: 10 mL/min — ABNORMAL LOW (ref >60)
GFR calc non Af Amer: 9 mL/min — ABNORMAL LOW (ref >60)
Glucose, Bld: 182 mg/dL — ABNORMAL HIGH (ref 70–99)
Potassium: 4.6 mmol/L (ref 3.5–5.1)
Sodium: 139 mmol/L (ref 135–145)
Total Bilirubin: 1.3 mg/dL — ABNORMAL HIGH (ref 0.3–1.2)
Total Protein: 7.8 g/dL (ref 6.5–8.1)

## 2020-04-07 LAB — RENAL FUNCTION PANEL
Albumin: 2.9 g/dL — ABNORMAL LOW (ref 3.5–5.0)
Anion gap: 16 — ABNORMAL HIGH (ref 5–15)
BUN: 35 mg/dL — ABNORMAL HIGH (ref 6–20)
CO2: 25 mmol/L (ref 22–32)
Calcium: 8.9 mg/dL (ref 8.9–10.3)
Chloride: 97 mmol/L — ABNORMAL LOW (ref 98–111)
Creatinine, Ser: 6.57 mg/dL — ABNORMAL HIGH (ref 0.61–1.24)
GFR calc Af Amer: 10 mL/min — ABNORMAL LOW (ref >60)
GFR calc non Af Amer: 9 mL/min — ABNORMAL LOW (ref >60)
Glucose, Bld: 114 mg/dL — ABNORMAL HIGH (ref 70–99)
Phosphorus: 6.1 mg/dL — ABNORMAL HIGH (ref 2.5–4.6)
Potassium: 4 mmol/L (ref 3.5–5.1)
Sodium: 138 mmol/L (ref 135–145)

## 2020-04-07 LAB — GLUCOSE, CAPILLARY
Glucose-Capillary: 121 mg/dL — ABNORMAL HIGH (ref 70–99)
Glucose-Capillary: 164 mg/dL — ABNORMAL HIGH (ref 70–99)
Glucose-Capillary: 74 mg/dL (ref 70–99)
Glucose-Capillary: 82 mg/dL (ref 70–99)
Glucose-Capillary: 89 mg/dL (ref 70–99)

## 2020-04-07 LAB — BLOOD GAS, ARTERIAL
Acid-Base Excess: 1.8 mmol/L (ref 0.0–2.0)
Bicarbonate: 29.1 mmol/L — ABNORMAL HIGH (ref 20.0–28.0)
Drawn by: 53309
FIO2: 60
O2 Saturation: 43.6 %
Patient temperature: 36.5
pCO2 arterial: 74.2 mmHg (ref 32.0–48.0)
pH, Arterial: 7.214 — ABNORMAL LOW (ref 7.350–7.450)
pO2, Arterial: <31 mmHg — CL (ref 83.0–108.0)

## 2020-04-07 LAB — PHOSPHORUS: Phosphorus: 6.1 mg/dL — ABNORMAL HIGH (ref 2.5–4.6)

## 2020-04-07 LAB — VITAMIN B12: Vitamin B-12: 512 pg/mL (ref 180–914)

## 2020-04-07 LAB — MAGNESIUM: Magnesium: 2.3 mg/dL (ref 1.7–2.4)

## 2020-04-07 LAB — MRSA PCR SCREENING: MRSA by PCR: NEGATIVE

## 2020-04-07 LAB — HEMOGLOBIN A1C
Hgb A1c MFr Bld: 5 % (ref 4.8–5.6)
Mean Plasma Glucose: 96.8 mg/dL

## 2020-04-07 LAB — RPR: RPR Ser Ql: NONREACTIVE

## 2020-04-07 LAB — TSH: TSH: 3.913 u[IU]/mL (ref 0.350–4.500)

## 2020-04-07 IMAGING — DX DG CHEST 1V PORT
1 series · 1 of 1 positions shown · non-contrast
Comparison: Same day.

CLINICAL DATA: Shortness of breath.

EXAM:
PORTABLE CHEST 1 VIEW

[chest]
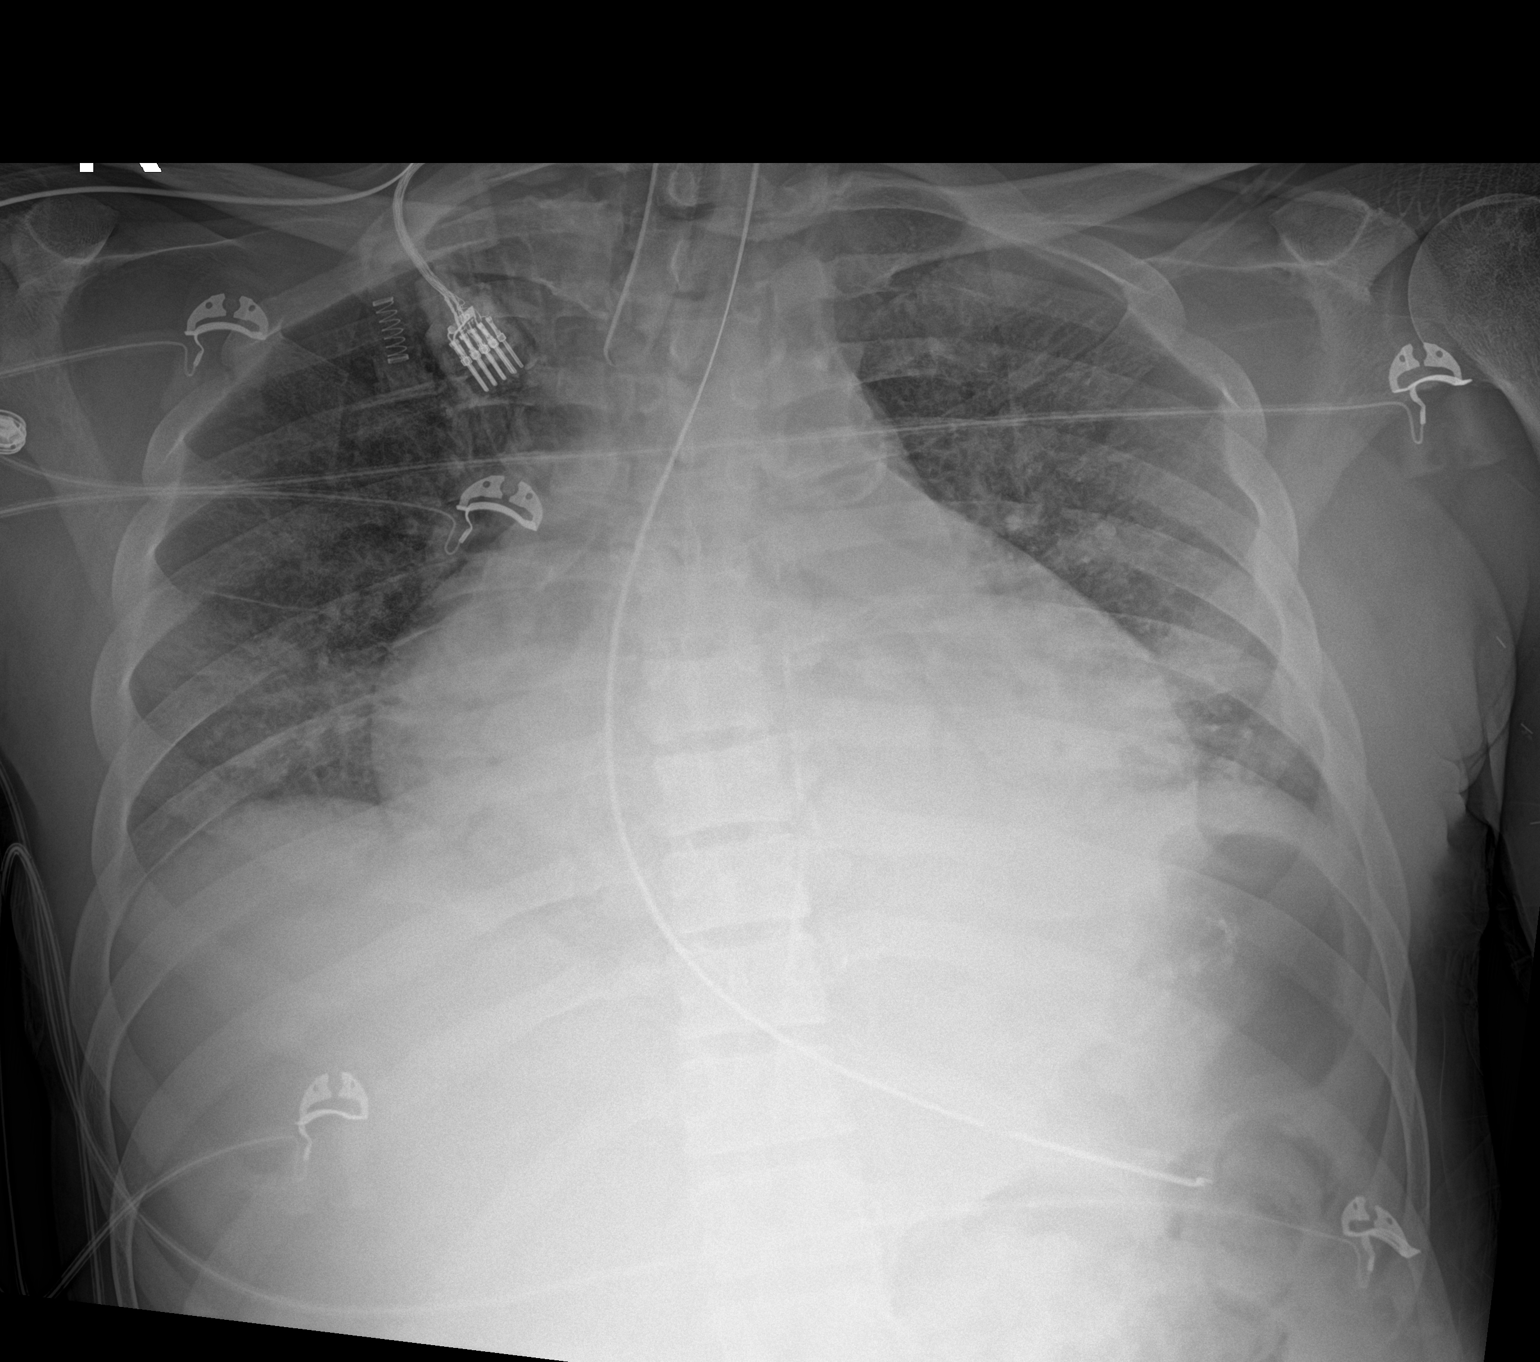

[1 of 1 positions shown; findings below may reference images not displayed]

FINDINGS: Stable cardiomegaly. Endotracheal and nasogastric tubes appear to be
in grossly good position. No pneumothorax pleural effusion is noted.
Hypoinflation of the lungs is noted with mild bibasilar subsegmental
atelectasis. Bony thorax is unremarkable.
IMPRESSION: Hypoinflation of the lungs with mild bibasilar subsegmental
atelectasis.

Aortic Atherosclerosis ([SP]-[SP]).

## 2020-04-07 IMAGING — DX DG CHEST 1V PORT
1 series · 1 of 1 positions shown · non-contrast
Comparison: [DATE]

CLINICAL DATA: Shortness of breath.

Congestive heart failure.
EXAM:
PORTABLE CHEST 1 VIEW

[chest]
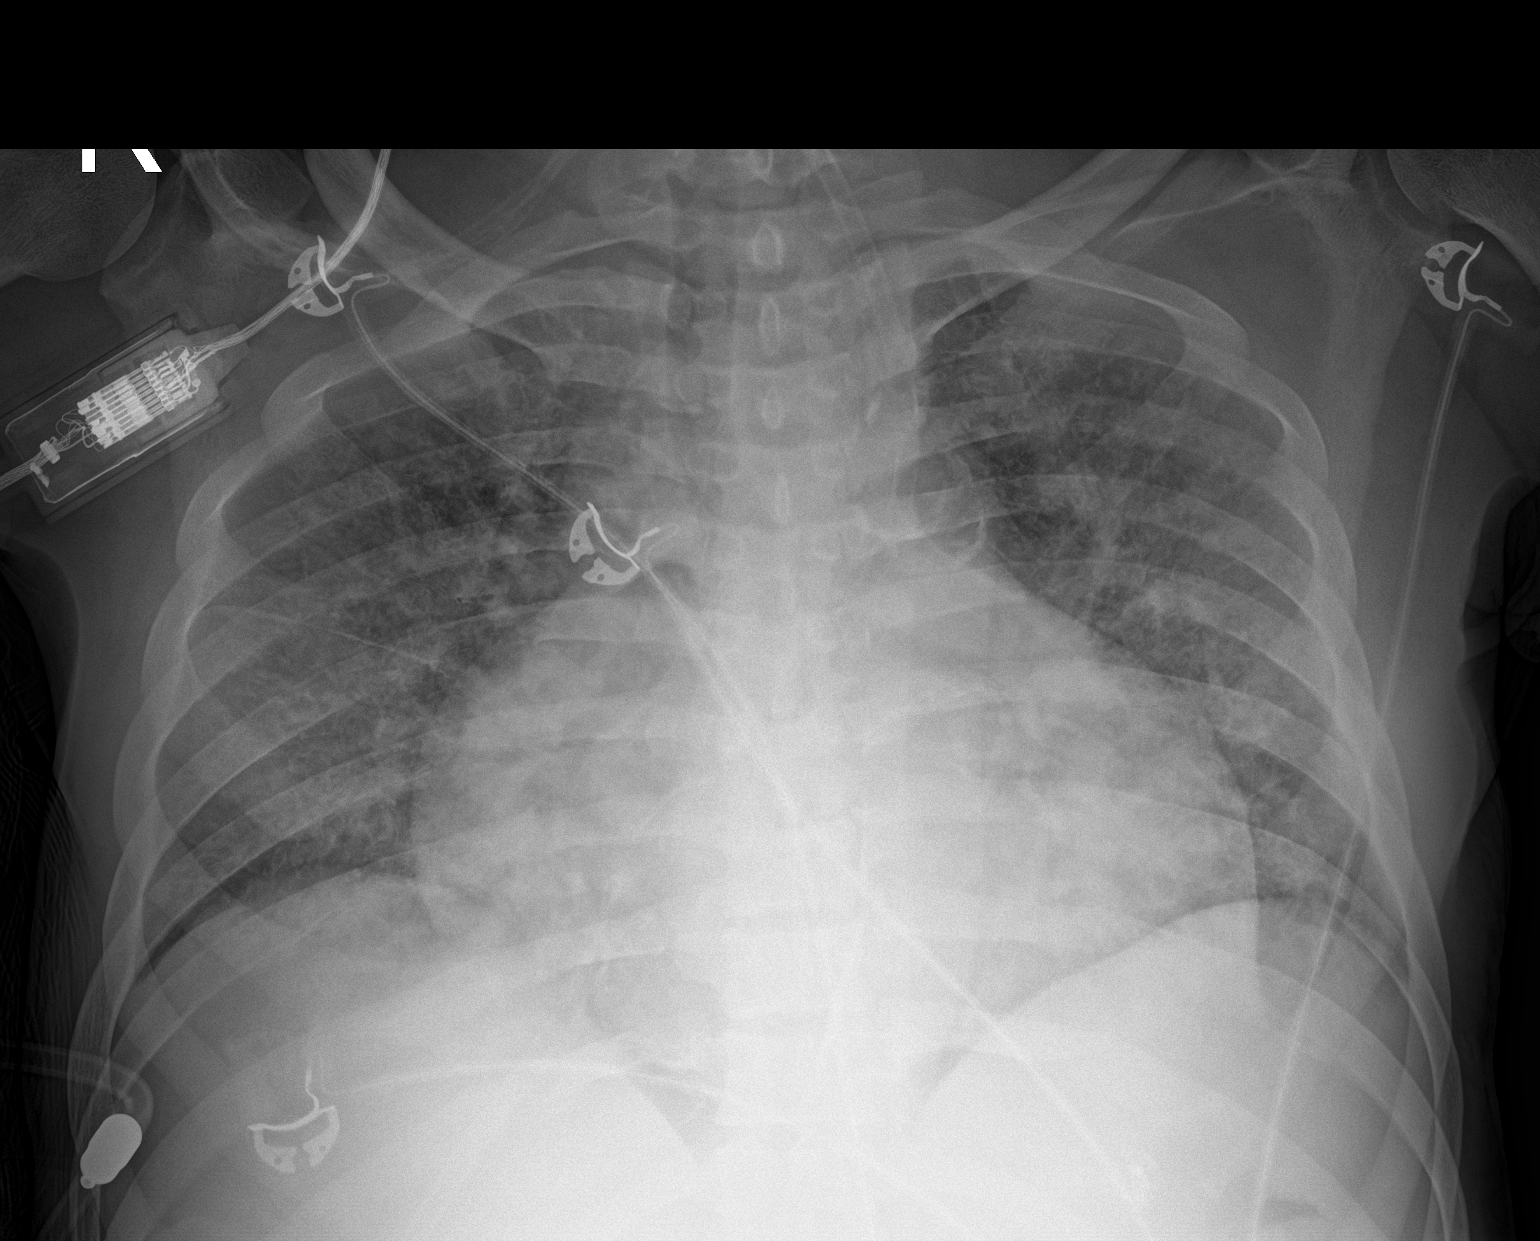

[1 of 1 positions shown; findings below may reference images not displayed]

FINDINGS: The cardiac silhouette is enlarged.

Diffuse alveolar and interstitial opacities bilaterally have
worsened from the most recent radiograph.

Osseous structures are without acute abnormality. Soft tissues are
grossly normal.
IMPRESSION: Enlarged cardiac silhouette with worsening diffuse alveolar and
interstitial opacities bilaterally, likely represent worsening mixed
pattern pulmonary edema secondary to congestive heart failure.

## 2020-04-07 MED ORDER — MIDAZOLAM HCL 2 MG/2ML IJ SOLN: 2.0000 mg | INTRAMUSCULAR | Status: DC | PRN

## 2020-04-07 MED ORDER — FOLIC ACID 1 MG PO TABS
1.0000 mg | ORAL_TABLET | Freq: Every day | ORAL | Status: DC
  Administered 2020-04-08 – 2020-04-10 (×3): 1 mg via ORAL
  Filled 2020-04-07 (×3): qty 1

## 2020-04-07 MED ORDER — THIAMINE HCL 100 MG PO TABS
100.0000 mg | ORAL_TABLET | Freq: Every day | ORAL | Status: DC
  Administered 2020-04-08 – 2020-04-10 (×3): 100 mg via ORAL
  Filled 2020-04-07 (×3): qty 1

## 2020-04-07 MED ORDER — AMIODARONE HCL 200 MG PO TABS
200.0000 mg | ORAL_TABLET | Freq: Two times a day (BID) | ORAL | Status: DC
  Administered 2020-04-08 – 2020-04-10 (×5): 200 mg via ORAL
  Filled 2020-04-07 (×5): qty 1

## 2020-04-07 MED ORDER — SODIUM CHLORIDE 0.9 % IV SOLN: 100.0000 mL | INTRAVENOUS | Status: DC | PRN

## 2020-04-07 MED ORDER — SEVELAMER CARBONATE 800 MG PO TABS
800.0000 mg | ORAL_TABLET | Freq: Three times a day (TID) | ORAL | Status: DC
  Administered 2020-04-07 (×2): 800 mg
  Filled 2020-04-07 (×2): qty 1

## 2020-04-07 MED ORDER — ADULT MULTIVITAMIN W/MINERALS CH
1.0000 | ORAL_TABLET | Freq: Every day | ORAL | Status: DC
  Administered 2020-04-07: 1
  Filled 2020-04-07: qty 1

## 2020-04-07 MED ORDER — INSULIN ASPART 100 UNIT/ML ~~LOC~~ SOLN: 0.0000 [IU] | SUBCUTANEOUS | Status: DC

## 2020-04-07 MED ORDER — DOCUSATE SODIUM 50 MG/5ML PO LIQD
100.0000 mg | Freq: Two times a day (BID) | ORAL | Status: DC
  Administered 2020-04-07: 100 mg
  Filled 2020-04-07: qty 10

## 2020-04-07 MED ORDER — CHLORHEXIDINE GLUCONATE 0.12% ORAL RINSE (MEDLINE KIT)
15.0000 mL | Freq: Two times a day (BID) | OROMUCOSAL | Status: DC
  Administered 2020-04-07: 15 mL via OROMUCOSAL

## 2020-04-07 MED ORDER — POLYETHYLENE GLYCOL 3350 17 G PO PACK
17.0000 g | PACK | Freq: Every day | ORAL | Status: DC
  Administered 2020-04-07: 17 g
  Filled 2020-04-07: qty 1

## 2020-04-07 MED ORDER — FLUMAZENIL 0.5 MG/5ML IV SOLN
INTRAVENOUS | Status: AC
  Filled 2020-04-07: qty 5

## 2020-04-07 MED ORDER — POLYETHYLENE GLYCOL 3350 17 G PO PACK: 17.0000 g | PACK | Freq: Every day | ORAL | Status: DC

## 2020-04-07 MED ORDER — PANTOPRAZOLE SODIUM 40 MG PO PACK
40.0000 mg | PACK | Freq: Every day | ORAL | Status: DC
  Administered 2020-04-07: 40 mg
  Filled 2020-04-07: qty 20

## 2020-04-07 MED ORDER — ACETAMINOPHEN 325 MG PO TABS: 650.0000 mg | ORAL_TABLET | ORAL | Status: DC | PRN

## 2020-04-07 MED ORDER — ADULT MULTIVITAMIN W/MINERALS CH
1.0000 | ORAL_TABLET | Freq: Every day | ORAL | Status: DC
  Administered 2020-04-08 – 2020-04-10 (×3): 1 via ORAL
  Filled 2020-04-07 (×3): qty 1

## 2020-04-07 MED ORDER — DOCUSATE SODIUM 50 MG/5ML PO LIQD: 100.0000 mg | Freq: Two times a day (BID) | ORAL | Status: DC

## 2020-04-07 MED ORDER — HEPARIN SODIUM (PORCINE) 1000 UNIT/ML DIALYSIS: 1000.0000 [IU] | INTRAMUSCULAR | Status: DC | PRN

## 2020-04-07 MED ORDER — ORAL CARE MOUTH RINSE
15.0000 mL | OROMUCOSAL | Status: DC
  Administered 2020-04-07 (×3): 15 mL via OROMUCOSAL

## 2020-04-07 MED ORDER — ALBUTEROL SULFATE (2.5 MG/3ML) 0.083% IN NEBU: 2.5000 mg | INHALATION_SOLUTION | RESPIRATORY_TRACT | Status: DC

## 2020-04-07 MED ORDER — CARVEDILOL 3.125 MG PO TABS
3.1250 mg | ORAL_TABLET | Freq: Two times a day (BID) | ORAL | Status: DC
  Administered 2020-04-08 – 2020-04-09 (×2): 3.125 mg via ORAL
  Filled 2020-04-07 (×3): qty 1

## 2020-04-07 MED ORDER — METOPROLOL TARTRATE 5 MG/5ML IV SOLN: 5.0000 mg | INTRAVENOUS | Status: DC | PRN

## 2020-04-07 MED ORDER — ETOMIDATE 2 MG/ML IV SOLN
INTRAVENOUS | Status: DC | PRN
  Administered 2020-04-07: 6 mg via INTRAVENOUS

## 2020-04-07 MED ORDER — CARVEDILOL 3.125 MG PO TABS
3.1250 mg | ORAL_TABLET | Freq: Two times a day (BID) | ORAL | Status: DC
  Filled 2020-04-07 (×2): qty 1

## 2020-04-07 MED ORDER — DOCUSATE SODIUM 50 MG/5ML PO LIQD
100.0000 mg | Freq: Two times a day (BID) | ORAL | Status: DC
Start: 2020-04-07 — End: 2020-04-07

## 2020-04-07 MED ORDER — PENTAFLUOROPROP-TETRAFLUOROETH EX AERO: 1.0000 "application " | INHALATION_SPRAY | CUTANEOUS | Status: DC | PRN

## 2020-04-07 MED ORDER — FOLIC ACID 1 MG PO TABS
1.0000 mg | ORAL_TABLET | Freq: Every day | ORAL | Status: DC
  Administered 2020-04-07: 1 mg
  Filled 2020-04-07: qty 1

## 2020-04-07 MED ORDER — IPRATROPIUM-ALBUTEROL 0.5-2.5 (3) MG/3ML IN SOLN: 3.0000 mL | RESPIRATORY_TRACT | Status: DC | PRN

## 2020-04-07 MED ORDER — SEVELAMER CARBONATE 800 MG PO TABS
800.0000 mg | ORAL_TABLET | Freq: Three times a day (TID) | ORAL | Status: DC
  Administered 2020-04-08 – 2020-04-10 (×5): 800 mg via ORAL
  Filled 2020-04-07 (×5): qty 1

## 2020-04-07 MED ORDER — THIAMINE HCL 100 MG PO TABS
100.0000 mg | ORAL_TABLET | Freq: Every day | ORAL | Status: DC
  Administered 2020-04-07: 100 mg
  Filled 2020-04-07: qty 1

## 2020-04-07 MED ORDER — FENTANYL CITRATE (PF) 100 MCG/2ML IJ SOLN: 50.0000 ug | INTRAMUSCULAR | Status: DC | PRN

## 2020-04-07 MED ORDER — SUCCINYLCHOLINE CHLORIDE 20 MG/ML IJ SOLN
INTRAMUSCULAR | Status: DC | PRN
  Administered 2020-04-07: 100 mg via INTRAVENOUS

## 2020-04-07 MED ORDER — PROPOFOL 1000 MG/100ML IV EMUL: 0.0000 ug/kg/min | INTRAVENOUS | Status: DC

## 2020-04-07 MED ORDER — PANTOPRAZOLE SODIUM 40 MG PO PACK
40.0000 mg | PACK | Freq: Every day | ORAL | Status: DC
  Administered 2020-04-08 – 2020-04-09 (×2): 40 mg via ORAL
  Filled 2020-04-07 (×3): qty 20

## 2020-04-07 MED ORDER — HEPARIN SODIUM (PORCINE) 1000 UNIT/ML DIALYSIS: 40.0000 [IU]/kg | INTRAMUSCULAR | Status: DC | PRN

## 2020-04-07 MED ORDER — FENTANYL CITRATE (PF) 100 MCG/2ML IJ SOLN
50.0000 ug | INTRAMUSCULAR | Status: DC | PRN
  Administered 2020-04-07 (×2): 100 ug via INTRAVENOUS
  Filled 2020-04-07 (×2): qty 2

## 2020-04-07 MED ORDER — AMIODARONE HCL 200 MG PO TABS: 200.0000 mg | ORAL_TABLET | Freq: Two times a day (BID) | ORAL | Status: DC

## 2020-04-07 MED FILL — Medication: Qty: 1 | Status: AC

## 2020-04-07 NOTE — Consult Note (Signed)
NAME:  Jordan [name], MRN:  [id number], DOB:  [birth year], LOS: 3 ADMISSION DATE:  04/03/2020, CONSULTATION DATE:  04/07/2020 REFERRING MD:  Dr. Chana Bode, [facility], CHIEF COMPLAINT:  AMS   Brief History   54 yo male smoker with hx of ESRD on iHD at welfare check at home after missing HD for 2 weeks.  Found to have hypoxia from acute pulmonary edema, hyperkalemia and a fib with RVR.  Seen by nephrology and cardiology.  Developed agitation and confusion, and started on CIWA protocol.  Developed altered mental status and respiratory arrest requiring intubation 7/17 and transferred to ICU.  Hx from medical team and chart review.  Past Medical History  ESRD, systolic/diastolic CHF, HTN, ETOH, Emphysema, ED  Significant Hospital Events   7/13 admit, iHD, Bipap 7/14 amiodarone gtt started, transfuse PRBC 7/15 CIWA protocol started 7/16 more confused/agitated 7/17 respiratory arrest/VDRF, transfer to ICU  Consults:  Nephrology Cardiology  Procedures:  ETT 7/17 >>   Significant Diagnostic Tests:  Echo 7/15 >> EF 45 to 50%, mod LVH, PASP 53.7, small/mod pericardial effusion  Micro Data:  COVID 7/13 >> negative  MRSA PCR 7/17 >>   Antimicrobials:    Interim history/subjective:    Objective   Blood pressure 136/80, pulse 93, temperature 97.7 F (36.5 C), temperature source Oral, resp. rate 18, height 5\' 4"  (1.626 m), weight 69.6 kg, SpO2 100 %.    Vent Mode: PRVC FiO2 (%):  [60 %] 60 % Set Rate:  [16 bmp] 16 bmp Vt Set:  [470 mL] 470 mL PEEP:  [5 cmH20] 5 cmH20   Intake/Output Summary (Last 24 hours) at 04/07/2020 0946 Last data filed at 04/06/2020 2213 Gross per 24 hour  Intake 220 ml  Output 3001 ml  Net -2781 ml   Filed Weights   04/06/20 0712 04/06/20 1025 04/07/20 0114  Weight: 71.8 kg 68.3 kg 69.6 kg    Examination:  General - on vent Eyes - pupils pinpoint ENT - ETT in place Cardiac - regular, 2/6 SM Chest - scattered rhonchi Abdomen - soft, non tender, +  bowel sounds Extremities - Lt upper arm AV fistula with +thrill Skin - no rashes Neuro - not following commands GU - condom cath on  Resolved Hospital Problem list   Hyperkalemia, thrombocytopenia  Assessment & Plan:   Acute hypoxic respiratory failure with compromised airway likely from ativan administration in setting of acute pulmonary edema and emphysema. - full vent support - f/u CXR, ABG - prn BDs  New onset a fib with RVR. Acute on chronic combined CHF. Pericardial effusion likely in setting of uremia. Hx of HTN. - amiodarone, coreg - ?if he is suitable candidate for long term anticoagulation given history of non compliance  ESRD. Metabolic bone disease. - iHD  Acute metabolic encephalopathy 2nd to hypoxia, uremia. ETOH withdrawal. - RASS goal 0 - prn versed, fentanyl - thiamine, folic acid  Anemia of critical illness and chronic disease. - f/u CBC - transfuse of Hb < 7 or significant bleeding  Hyperglycemia. - SSI   Best practice:  Diet: NPO DVT prophylaxis: SQ heparin GI prophylaxis: protonix Mobility: bed rest Code Status: full code Disposition: ICU  Labs   CBC: Recent Labs  Lab 04/03/20 1710 04/03/20 1737 04/03/20 2230 04/04/20 1345 04/05/20 0500 04/06/20 0506 04/07/20 0838  WBC 7.1   < > 7.1 7.5 7.2 8.6 10.4  NEUTROABS 6.0  --   --   --  5.7 6.7 7.8*  HGB 7.1*   < >  6.4* 6.7* 8.3* 8.5* 8.8*  HCT 25.0*   < > 20.3* 21.9* 26.3* 27.3* 30.4*  MCV 116.3*   < > 105.7* 106.3* 101.9* 105.0* 109.0*  PLT 165   < > 135* 127* 126* 146* 188   < > = values in this interval not displayed.    Basic Metabolic Panel: Recent Labs  Lab 04/03/20 1710 04/03/20 1710 04/03/20 1737 04/03/20 2230 04/04/20 0500 04/05/20 0500 04/06/20 0506  NA 140   < > 137 141 140 141 139  K >7.5*   < > 8.2* 4.2 5.3* 4.0 3.4*  CL 102   < > 109 101 100 98 97*  CO2 12*  --   --  21* 20* 27 26  GLUCOSE 94   < > 89 167* 138* 82 118*  BUN 206*   < > >130* 95* 100* 59*  40*  CREATININE 22.03*   < > >18.00* 10.43* 12.51* 9.01* 6.97*  CALCIUM 9.7  --   --  9.4 8.9 9.4 9.2  MG  --   --   --   --   --  2.2 2.0  PHOS  --   --   --  4.4  --  6.1* 4.9*   < > = values in this interval not displayed.   GFR: Estimated Creatinine Clearance: 10.4 mL/min (A) (by C-G formula based on SCr of 6.97 mg/dL (H)). Recent Labs  Lab 04/04/20 1345 04/05/20 0500 04/06/20 0506 04/07/20 0838  WBC 7.5 7.2 8.6 10.4    Liver Function Tests: Recent Labs  Lab 04/03/20 2230 04/05/20 0500 04/06/20 0506  AST  --  16 22  ALT  --  16 18  ALKPHOS  --  102 106  BILITOT  --  1.9* 1.6*  PROT  --  7.1 7.3  ALBUMIN 3.4* 3.2* 3.4*   No results for input(s): LIPASE, AMYLASE in the last 168 hours. Recent Labs  Lab 04/05/20 1632 04/06/20 1308  AMMONIA 51* 36*    ABG    Component Value Date/Time   PHART 7.314 (L) 04/05/2020 1435   PCO2ART 50.9 (H) 04/05/2020 1435   PO2ART 188 (H) 04/05/2020 1435   HCO3 25.1 04/05/2020 1435   TCO2 13 (L) 04/03/2020 1737   ACIDBASEDEF 0.3 04/05/2020 1435   O2SAT 99.7 04/05/2020 1435     Coagulation Profile: No results for input(s): INR, PROTIME in the last 168 hours.  Cardiac Enzymes: No results for input(s): CKTOTAL, CKMB, CKMBINDEX, TROPONINI in the last 168 hours.  HbA1C: Hgb A1c MFr Bld  Date/Time Value Ref Range Status  06/23/2019 08:40 AM 6.3 4.6 - 6.5 % Final    Comment:    Glycemic Control Guidelines for People with Diabetes:Non Diabetic:  <6%Goal of Therapy: <7%Additional Action Suggested:  >8%   05/17/2018 03:58 AM 4.7 (L) 4.8 - 5.6 % Final    Comment:    (NOTE) Pre diabetes:          5.7%-6.4% Diabetes:              >6.4% Glycemic control for   <7.0% adults with diabetes     CBG: Recent Labs  Lab 04/07/20 0924  GLUCAP 164*    Review of Systems:   Unable to obtain  Past Medical History  He,  has a past medical history of Anemia, Aortic atherosclerosis ([place]) (11/12/2019), CKD (chronic kidney disease),  Dyspnea, ED (erectile dysfunction), Emphysema of lung ([place]) (11/12/2019), ETOH abuse, History of blood transfusion, Hypertension, and Wears dentures.  Surgical History    Past Surgical History:  Procedure Laterality Date  . BASCILIC VEIN TRANSPOSITION Left 08/07/2016   Procedure: LEFT BASILIC VEIN TRANSPOSITION;  Surgeon: Angelia Mould, MD;  Location: [facility];  Service: Vascular;  Laterality: Left;  . CLOSED REDUCTION NASAL FRACTURE N/A 08/11/2019   Procedure: CLOSED REDUCTION NASAL FRACTURE WITH STABILIZATION;  Surgeon: Irene Limbo, MD;  Location: [facility];  Service: Plastics;  Laterality: N/A;  . COLONOSCOPY N/A 08/12/2016   Procedure: COLONOSCOPY;  Surgeon: Otis Brace, MD;  Location: [facility];  Service: Gastroenterology;  Laterality: N/A;  . ESOPHAGOGASTRODUODENOSCOPY N/A 08/12/2016   Procedure: ESOPHAGOGASTRODUODENOSCOPY (EGD);  Surgeon: Otis Brace, MD;  Location: [facility];  Service: Gastroenterology;  Laterality: N/A;  . INSERTION OF DIALYSIS CATHETER N/A 08/07/2016   Procedure: INSERTION OF TUNNELED DIALYSIS CATHETER;  Surgeon: Angelia Mould, MD;  Location: [facility];  Service: Vascular;  Laterality: N/A;  . LIGATION OF ARTERIOVENOUS  FISTULA Left 09/12/2016   Procedure: BANDING OF LEFT  ARTERIOVENOUS  FISTULA;  Surgeon: Angelia Mould, MD;  Location: [facility];  Service: Vascular;  Laterality: Left;  . LOWER EXTREMITY INTERVENTION Right 12/02/2018   Procedure: LOWER EXTREMITY INTERVENTION;  Surgeon: Algernon Huxley, MD;  Location: [facility];  Service: Cardiovascular;  Laterality: Right;     Social History   reports that he has been smoking cigarettes. He has a 20.00 pack-year smoking history. He has never used smokeless tobacco. He reports current alcohol use of about 21.0 standard drinks of alcohol per week. He reports that he does not use drugs.   Family History   His family history includes Diabetes in his mother; Healthy in his father  and sister; Kidney disease in his daughter; Kidney failure in his brother and mother. There is no history of Post-traumatic stress disorder, Bladder Cancer, or Kidney cancer.   Allergies No Known Allergies   Home Medications  Prior to Admission medications   Medication Sig Start Date End Date Taking? Authorizing Provider  calcium acetate (PHOSLO) 667 MG capsule Take 2 capsules (1,334 mg total) by mouth 3 (three) times daily with meals. Patient taking differently: Take 2,001 mg by mouth 3 (three) times daily with meals.  02/12/19  Yes Fritzi Mandes, MD  camphor-menthol [facility]) lotion Apply 1 application topically as needed for itching. 11/23/19  Yes Charlann Lange, PA-C  patiromer (VELTASSA) 8.4 g packet Take 1 packet (8.4 g total) by mouth daily. 03/14/20  Yes Lorella Nimrod, MD  sevelamer carbonate (RENVELA) 800 MG tablet Take 800 mg by mouth 3 (three) times daily with meals.   Yes [provider]  triamcinolone cream (KENALOG) 0.1 % Apply 1 application topically 2 (two) times daily. Patient taking differently: Apply 1 application topically 2 (two) times daily as needed (itching).  11/23/19  Yes Charlann Lange, PA-C     Critical care time: 41 minutes  Chesley Mires, MD [place] Pager - [phone] 04/07/2020, 10:05 AM

## 2020-04-07 NOTE — Progress Notes (Signed)
Patient ID: Jordan [name], male   DOB: [birth year], 54 y.o.   MRN: [id number] [facility] Progress Note   Assessment/ Plan:   1.  Acute hypoxic respiratory failure secondary to pulmonary edema/volume overload: This is due to missed hemodialysis treatments and continues to improve with hemodialysis and ultrafiltration-I will order additional dialysis for today with efforts at trying to lower his dry weight. 2. ESRD: Continue hemodialysis on Monday/Wednesday/Friday schedule with hemodialysis again ordered for today to try and volume unloading as well as offer clearance to try and improve his mental status.  Adherence has been a challenge in the past and is complicated by what appears to be a social situation/depression-referral to mental health initiated about 2 weeks ago. 3. Anemia: Continue ESA while here in the hospital, poor adherence with dialysis unfortunately has resulted in inconsistent ESA delivery. 4. CKD-MBD: Calcium and phosphorus levels currently at goal, continue to monitor on low-dose sevelamer. 5.  New onset atrial fibrillation with rapid ventricular response: Seen by cardiology and echocardiogram report pending.  Now on oral amiodarone with rate control. 6. Hypertension: Blood pressure within acceptable range, continue to follow with ultrafiltration at hemodialysis.  Subjective:   Refused BiPAP overnight, no records of oxygen saturations overnight.   Objective:   BP 136/80   Pulse 93   Temp 97.7 F (36.5 C) (Oral)   Resp 18   Ht 5\' 4"  (1.626 m)   Wt 69.6 kg   SpO2 (!) 24%   BMI 26.34 kg/m   Physical Exam: Gen: Lethargic/confused this morning.  Does not respond to calling his name CVS: Pulse regular, normal rate, S1 and S2 normal Resp: Anteriorly coarse breath sounds bilaterally without distinct rales/rhonchi Abd: Soft, obese, nontender, bowel sounds Ext: No lower extremity edema, left upper arm AV fistula cannulated at dialysis  Labs: BMET Recent Labs   Lab 04/03/20 1710 04/03/20 1737 04/03/20 2230 04/04/20 0500 04/05/20 0500 04/06/20 0506  NA 140 137 141 140 141 139  K >7.5* 8.2* 4.2 5.3* 4.0 3.4*  CL 102 109 101 100 98 97*  CO2 12*  --  21* 20* 27 26  GLUCOSE 94 89 167* 138* 82 118*  BUN 206* >130* 95* 100* 59* 40*  CREATININE 22.03* >18.00* 10.43* 12.51* 9.01* 6.97*  CALCIUM 9.7  --  9.4 8.9 9.4 9.2  PHOS  --   --  4.4  --  6.1* 4.9*   CBC Recent Labs  Lab 04/03/20 1710 04/03/20 1737 04/03/20 2230 04/04/20 1345 04/05/20 0500 04/06/20 0506  WBC 7.1   < > 7.1 7.5 7.2 8.6  NEUTROABS 6.0  --   --   --  5.7 6.7  HGB 7.1*   < > 6.4* 6.7* 8.3* 8.5*  HCT 25.0*   < > 20.3* 21.9* 26.3* 27.3*  MCV 116.3*   < > 105.7* 106.3* 101.9* 105.0*  PLT 165   < > 135* 127* 126* 146*   < > = values in this interval not displayed.      Medications:    . sodium chloride   Intravenous Once  . amiodarone  200 mg Oral BID  . carvedilol  3.125 mg Oral BID WC  . Chlorhexidine Gluconate Cloth  6 each Topical Q0600  . Chlorhexidine Gluconate Cloth  6 each Topical Q0600  . folic acid  1 mg Oral Daily  . heparin injection (subcutaneous)  5,000 Units Subcutaneous Q8H  . lactulose  20 g Oral BID  . LORazepam  0-4 mg Intravenous Q4H  Followed by  . LORazepam  0-4 mg Intravenous Q8H  . multivitamin with minerals  1 tablet Oral Daily  . sevelamer carbonate  800 mg Oral TID WC  . sodium chloride flush  3 mL Intravenous Q12H  . thiamine  100 mg Oral Daily   Or  . thiamine  100 mg Intravenous Daily   Elmarie Shiley, MD 04/07/2020, 7:56 AM

## 2020-04-07 NOTE — Progress Notes (Signed)
Called into room by NT, patient self extubated.  Restraints still on.  Patient placed on NRB and Dr. Halford Chessman made aware.  Tachycardic and lethargic, Dr. Halford Chessman at bedside, IV beta blocker to be ordered.  Monitor patients mental status and respiratory rate.

## 2020-04-07 NOTE — Progress Notes (Signed)
   04/07/20 0900  Clinical Encounter Type  Visited With Patient  Visit Type Code   Chaplain responded to code.  Chaplain available to provide support as needed.  Chaplain spoke with nurse about family support and will wait to proceed with contact once medical professionals have spoken with Wendal's family or support.

## 2020-04-07 NOTE — Progress Notes (Signed)
[facility] Progress Note Patient Name: ELIYAS SUDDRETH DOB: 1966/07/16 MRN: [id number]   Date of Service  04/07/2020  HPI/Events of Note  Camera: Discussed with bed side RN. Self extubated around 7 pm. Encephalopathic, able to cough and follow simple commands, goes to sleep. sats 98% on 55% o2 on mask. GR 91, 137/51. S/p HD. Last night got intubated , not tolerating bipap. On restraints also. Bed side asking for ? Re intubation.  eICU Interventions  - for now asp precautions - get stat ABG, if abnormal, will notify gound team to assess to re intubate.      Intervention Category Intermediate Interventions: Respiratory distress - evaluation and management Minor Interventions: Communication with other healthcare providers and/or family  Elmer Sow 04/07/2020, 8:40 PM

## 2020-04-07 NOTE — Progress Notes (Signed)
Self extubation. No noted respiratory distress.

## 2020-04-07 NOTE — Progress Notes (Signed)
Progress Note  Patient Name: Jordan [name] Date of Encounter: 04/07/2020  Primary Cardiologist: New to [facility]   Subjective   Acute respiratory arrest this am. Intubated at bedside. Transferring to ICU. Noted to be confused last night. Refused Bipap. This am more lethargic. Developed agonal breathing. Given IV Narcan and Romazicon. Did not require CPR. Never lost pulse or BP. In NSR.    Inpatient Medications    Scheduled Meds:  sodium chloride   Intravenous Once   amiodarone  200 mg Oral BID   carvedilol  3.125 mg Oral BID WC   Chlorhexidine Gluconate Cloth  6 each Topical Q0600   Chlorhexidine Gluconate Cloth  6 each Topical Q0600   flumazenil       folic acid  1 mg Oral Daily   heparin injection (subcutaneous)  5,000 Units Subcutaneous Q8H   lactulose  20 g Oral BID   LORazepam  0-4 mg Intravenous Q4H   Followed by   LORazepam  0-4 mg Intravenous Q8H   multivitamin with minerals  1 tablet Oral Daily   sevelamer carbonate  800 mg Oral TID WC   sodium chloride flush  3 mL Intravenous Q12H   thiamine  100 mg Oral Daily   Or   thiamine  100 mg Intravenous Daily   Continuous Infusions:  sodium chloride     sodium chloride     PRN Meds: sodium chloride, acetaminophen, camphor-menthol, heparin, hydrALAZINE, ipratropium, levalbuterol, LORazepam **OR** LORazepam, ondansetron (ZOFRAN) IV, sodium chloride flush   Vital Signs    Vitals:   04/07/20 0100 04/07/20 0114 04/07/20 0500 04/07/20 0616  BP: (!) 147/84  136/80   Pulse: 93     Resp:      Temp:    97.7 F (36.5 C)  TempSrc:    Oral  SpO2:      Weight:  69.6 kg    Height:        Intake/Output Summary (Last 24 hours) at 04/07/2020 0911 Last data filed at 04/06/2020 2213 Gross per 24 hour  Intake 220 ml  Output 3001 ml  Net -2781 ml   Filed Weights   04/06/20 0712 04/06/20 1025 04/07/20 0114  Weight: 71.8 kg 68.3 kg 69.6 kg    Physical Exam   General: Ill appearing, inutubated Neck:  Negative for carotid bruits. No JVD Lungs:Clear to ausculation bilaterally.  Cardiovascular: RRR with S1 S2. No murmurs Abdomen: Soft, non-tender, non-distended. No obvious abdominal masses. Extremities: No edema. Radial pulses 2+ bilaterally Neuro: Intubated not responsive   Labs    Chemistry Recent Labs  Lab 04/03/20 2230 04/03/20 2230 04/04/20 0500 04/05/20 0500 04/06/20 0506  NA 141   < > 140 141 139  K 4.2   < > 5.3* 4.0 3.4*  CL 101   < > 100 98 97*  CO2 21*   < > 20* 27 26  GLUCOSE 167*   < > 138* 82 118*  BUN 95*   < > 100* 59* 40*  CREATININE 10.43*   < > 12.51* 9.01* 6.97*  CALCIUM 9.4   < > 8.9 9.4 9.2  PROT  --   --   --  7.1 7.3  ALBUMIN 3.4*  --   --  3.2* 3.4*  AST  --   --   --  16 22  ALT  --   --   --  16 18  ALKPHOS  --   --   --  102 106  BILITOT  --   --   --  1.9* 1.6*  GFRNONAA 5*   < > 4* 6* 8*  GFRAA 6*   < > 5* 7* 10*  ANIONGAP 19*   < > 20* 16* 16*   < > = values in this interval not displayed.     Hematology Recent Labs  Lab 04/04/20 1345 04/05/20 0500 04/06/20 0506  WBC 7.5 7.2 8.6  RBC 2.06* 2.58* 2.60*  HGB 6.7* 8.3* 8.5*  HCT 21.9* 26.3* 27.3*  MCV 106.3* 101.9* 105.0*  MCH 32.5 32.2 32.7  MCHC 30.6 31.6 31.1  RDW 19.1* 20.8* 19.7*  PLT 127* 126* 146*    Cardiac EnzymesNo results for input(s): TROPONINI in the last 168 hours. No results for input(s): TROPIPOC in the last 168 hours.   BNPNo results for input(s): BNP, PROBNP in the last 168 hours.   DDimer No results for input(s): DDIMER in the last 168 hours.   Radiology    DG CHEST PORT 1 VIEW  Result Date: 04/06/2020 CLINICAL DATA:  Dyspnea EXAM: PORTABLE CHEST 1 VIEW COMPARISON:  Chest radiograph from one day prior. FINDINGS: Stable cardiomediastinal silhouette with moderate cardiomegaly. No pneumothorax. No pleural effusion. Mild-to-moderate diffuse pulmonary edema, not substantially changed. IMPRESSION: Mild-to-moderate congestive heart failure, not substantially  changed. Electronically Signed   By: Ilona Sorrel M.D.   On: 04/06/2020 09:32   ECHOCARDIOGRAM LIMITED  Result Date: 04/05/2020    ECHOCARDIOGRAM LIMITED REPORT   Patient Name:   Jordan [name] Date of Exam: 04/05/2020 Medical Rec #:  [id number]        Height:       64.0 in Accession #:    [id number]       Weight:       160.0 lb Date of Birth:  10-13-1965        BSA:          1.779 m Patient Age:    54 years         BP:           130/85 mmHg Patient Gender: M                HR:           137 bpm. Exam Location:  Inpatient Procedure: Limited Echo, Limited Color Doppler and Cardiac Doppler Indications:    CHF-Acute Systolic E42.35 Limited for Pericardial Effusion and                 EF.  History:        Patient has prior history of Echocardiogram examinations, most                 recent 01/24/2020. Risk Factors:Hypertension.  Sonographer:    Mikki Santee RDCS (AE) Referring Phys: [place]  1. Limited echo to assess for pericardial effusion and LV systolic function  2. Difficult to assess LVEF given patient was in Afib with RVR during study, but appears mildly reduced. Left ventricular ejection fraction, by estimation, is 45 to 50%. The left ventricle has mildly decreased function. The left ventricle demonstrates global hypokinesis. There is moderate left ventricular hypertrophy.  3. Right ventricular systolic function is mildly reduced. The right ventricular size is moderately enlarged. There is moderately elevated pulmonary artery systolic pressure. The estimated right ventricular systolic pressure is 36.1 mmHg.  4. Small to moderate pericardial effusion measuring up to 1 cm adjacent to LV lateral wall. Unable to assess for mitral  inflow respiratory variation given Afib. IVC is fixed/dilated, but no RA/RV collapse is seen to suggest tamponade.  5. The inferior vena cava is dilated in size with <50% respiratory variability, suggesting right atrial pressure of 15 mmHg.  6. Mitral  regurgitation was not well evaluated on current study, but noted to have eccentric MR on prior echo that by quantification was severe (ERO 0.46 cm^2, RV 73cc). Consider TEE or cardiac MRI for further evaluation FINDINGS  Left Ventricle: Left ventricular ejection fraction, by estimation, is 45 to 50%. The left ventricle has mildly decreased function. The left ventricle demonstrates global hypokinesis. The left ventricular internal cavity size was normal in size. There is  moderate left ventricular hypertrophy. Right Ventricle: The right ventricular size is moderately enlarged. Right ventricular systolic function is mildly reduced. There is moderately elevated pulmonary artery systolic pressure. The tricuspid regurgitant velocity is 3.11 m/s, and with an assumed right atrial pressure of 15 mmHg, the estimated right ventricular systolic pressure is 14.9 mmHg. Pericardium: Small to moderate pericardial effusion measuring up to 1 cm adjacent to LV lateral wall. Unable to assess for mitral inflow respiratory variation given Afib. IVC is fixed/dilated, but no RA/RV collapse is seen to suggest tamponade. A small pericardial effusion is present. Mitral Valve: Moderate mitral valve regurgitation. Tricuspid Valve: The tricuspid valve is normal in structure. Tricuspid valve regurgitation is mild. Aortic Valve: Aortic valve regurgitation is not visualized. No aortic stenosis is present. Aorta: The aortic root is normal in size and structure. Venous: The inferior vena cava is dilated in size with less than 50% respiratory variability, suggesting right atrial pressure of 15 mmHg. LEFT VENTRICLE PLAX 2D LVIDd:         5.20 cm LVIDs:         3.70 cm LV PW:         1.50 cm LV IVS:        1.10 cm  LEFT ATRIUM         Index LA diam:    4.20 cm 2.36 cm/m   AORTA Ao Root diam: 3.30 cm MR Peak grad: 107.7 mmHg  TRICUSPID VALVE MR Vmax:      519.00 cm/s TR Peak grad:   38.7 mmHg                           TR Vmax:        311.00 cm/s  Oswaldo Milian MD Electronically signed by Oswaldo Milian MD Signature Date/Time: 04/05/2020/6:18:34 PM    Final    Telemetry    04/06/20 ST with HRs in the 130's - Personally Reviewed  ECG    No new tracing as of 04/06/20- Personally Reviewed  Cardiac Studies   Echocardiogram 04/05/2020:    1. Limited echo to assess for pericardial effusion and LV systolic  function   2. Difficult to assess LVEF given patient was in Afib with RVR during  study, but appears mildly reduced. Left ventricular ejection fraction, by  estimation, is 45 to 50%. The left ventricle has mildly decreased  function. The left ventricle demonstrates  global hypokinesis. There is moderate left ventricular hypertrophy.   3. Right ventricular systolic function is mildly reduced. The right  ventricular size is moderately enlarged. There is moderately elevated  pulmonary artery systolic pressure. The estimated right ventricular  systolic pressure is 70.2 mmHg.   4. Small to moderate pericardial effusion measuring up to 1 cm adjacent  to LV lateral wall. Unable  to assess for mitral inflow respiratory  variation given Afib. IVC is fixed/dilated, but no RA/RV collapse is seen  to suggest tamponade.   5. The inferior vena cava is dilated in size with <50% respiratory  variability, suggesting right atrial pressure of 15 mmHg.   6. Mitral regurgitation was not well evaluated on current study, but  noted to have eccentric MR on prior echo that by quantification was severe  (ERO 0.46 cm^2, RV 73cc). Consider TEE or cardiac MRI for further  evaluation      Echocardiogram 01/24/2020:   1. Left ventricular ejection fraction, by estimation, is 45 to 50%. The  left ventricle has mildly decreased function. The left ventricle  demonstrates global hypokinesis. There is moderate left ventricular  hypertrophy. Left ventricular diastolic  parameters are consistent with Grade III diastolic dysfunction  (restrictive).  Elevated left ventricular end-diastolic pressure.   2. Right ventricular systolic function is normal. The right ventricular  size is normal. There is severely elevated pulmonary artery systolic  pressure.   3. Left atrial size was severely dilated.   4. Right atrial size was severely dilated.   5. The mitral valve is grossly normal. Mild mitral valve regurgitation.  No evidence of mitral stenosis.   6. Tricuspid valve regurgitation is moderate.   7. The aortic valve is tricuspid. Aortic valve regurgitation is not  visualized. Mild aortic valve sclerosis is present, with no evidence of  aortic valve stenosis.   8. The inferior vena cava is dilated in size with <50% respiratory  variability, suggesting right atrial pressure of 15 mmHg.   Comparison(s): No prior Echocardiogram.   Patient Profile     54 y.o. male with a hx of ESRD noncompliant with HD, anemia of chronic kidney disease, recurrent hyperkalemia, secondary hyperparathyroidism, history of EtOH and tobacco abuse who is being seen today for the evaluation of new onset atrial fibrillation at the request of Dr. Alfredia Ferguson.  Assessment & Plan    1. New onset AF with RVR: -Started on low-dose Eliquis per primary team however suspect is not a candidate for chronic long-term anticoagulation given history of poor compliance and hemodialysis -Plan to repeat echocardiogram to rule out pericardial effusion as outpatient>>EF noted to be 45-50% with mildly decreased diastolic function, global hypokinesis, moderately elevated PA pressure at 37mmHg with small to moderate pericardial effusion with no RA/RV collapse to indicate cardiac tamponade. Mitral regurgitation was not well evaluated on current study. Reevaluate with outpatient Echo.  -Patient loaded with IV amiodarone infusion to help maintain NSR>>transitioned to po amiodarone yesterday.  -Maintaining SR/ST  -HR improved with  low dose BB    2. Acute respiratory failure: respiratory arrest  this am. -now intubated. Suspect this is at least in part to respiratory depression from CIWA protocol. Plans per CCM.    3.  Acute on chronic combined systolic and diastolic CHF: -Prior echocardiogram from May 2021 with LVEF of 45 to 50%>> cardiology service not consulted at that time -Given history of noncompliance, likely poor candidate for further invasive study -Fuid volume managed with HD    4.  Hyperkalemia/hypokalemia: -Replacement with HD   5.  Anemia of chronic disease: -Stabilizing>>hemoglobin, 8.5 this a.m. up from 6.7 yesterday   Signed, Akiba Melfi [place] MD, [facility]   04/07/2020, 9:11 AM     For questions or updates, please contact   Please consult [email or web address] for contact info under Cardiology/STEMI.

## 2020-04-07 NOTE — Progress Notes (Signed)
CRITICAL VALUE ALERT  Critical Value:  PCO2 74.2   PO2 <31  Date & Time Notied:  04/07/20 10:53  Provider Notified: Dr. Halford Chessman  Orders Received/Actions taken: Believed to be veinous blood gas, will continue to monitor.

## 2020-04-07 NOTE — Plan of Care (Signed)
  Problem: Elimination: Goal: Will not experience complications related to bowel motility Outcome: Progressing   

## 2020-04-07 NOTE — Progress Notes (Signed)
Self extubated himself.  Maintaining SpO2, no signs of respiratory distress.  Still sleepy but wakes up easily.  Will monitor.  Chesley Mires, MD [place] Pager - [phone] 04/07/2020, 6:28 PM

## 2020-04-07 NOTE — Progress Notes (Signed)
Pt refused Bipap tonight.

## 2020-04-07 NOTE — Anesthesia Procedure Notes (Signed)
Procedure Name: Intubation Date/Time: 04/07/2020 9:00 AM Performed by: Amadeo Garnet, CRNA Pre-anesthesia Checklist: Patient identified, Emergency Drugs available, Suction available and Patient being monitored Patient Re-evaluated:Patient Re-evaluated prior to induction Oxygen Delivery Method: Ambu bag Preoxygenation: Pre-oxygenation with 100% oxygen Induction Type: IV induction Ventilation: Two handed mask ventilation required Laryngoscope Size: Mac and 3 Grade View: Grade I Tube type: Oral Tube size: 8.0 mm Number of attempts: 1 Airway Equipment and Method: Stylet Placement Confirmation: positive ETCO2,  ETT inserted through vocal cords under direct vision and breath sounds checked- equal and bilateral Secured at: 22 cm Tube secured with: Tape Dental Injury: Teeth and Oropharynx as per pre-operative assessment

## 2020-04-07 NOTE — Progress Notes (Signed)
Patient unfortunately had a respiratory arrest this morning around 8:54 and I was not paged or called until 9:11 AM.  By the time I got to the room at 9:13 patient had already been intubated and the critical care team was there and they are about to wean the patient down to the ICU.  Patient never lost a pulse and did not drop his blood pressure but when it respiratory arrest after he was given Ativan.  Last night apparently he is combative and refuses BiPAP.  He will be transferred to the critical care service and be placed on full ventilatory support and patient is to be dialyzed again given his volume overload.  TRH will sign off at this time and assume care when he is stable to be transferred out of ICU and when he is extubated.

## 2020-04-08 ENCOUNTER — Encounter (HOSPITAL_COMMUNITY): Payer: Self-pay | Admitting: Internal Medicine

## 2020-04-08 DIAGNOSIS — G9341 Metabolic encephalopathy: Secondary | ICD-10-CM

## 2020-04-08 LAB — BASIC METABOLIC PANEL
Anion gap: 12 (ref 5–15)
BUN: 20 mg/dL (ref 6–20)
CO2: 27 mmol/L (ref 22–32)
Calcium: 9.3 mg/dL (ref 8.9–10.3)
Chloride: 97 mmol/L — ABNORMAL LOW (ref 98–111)
Creatinine, Ser: 4.63 mg/dL — ABNORMAL HIGH (ref 0.61–1.24)
GFR calc Af Amer: 16 mL/min — ABNORMAL LOW (ref >60)
GFR calc non Af Amer: 14 mL/min — ABNORMAL LOW (ref >60)
Glucose, Bld: 77 mg/dL (ref 70–99)
Potassium: 3.7 mmol/L (ref 3.5–5.1)
Sodium: 136 mmol/L (ref 135–145)

## 2020-04-08 LAB — GLUCOSE, CAPILLARY
Glucose-Capillary: 83 mg/dL (ref 70–99)
Glucose-Capillary: 76 mg/dL (ref 70–99)
Glucose-Capillary: 110 mg/dL — ABNORMAL HIGH (ref 70–99)
Glucose-Capillary: 126 mg/dL — ABNORMAL HIGH (ref 70–99)
Glucose-Capillary: 147 mg/dL — ABNORMAL HIGH (ref 70–99)

## 2020-04-08 MED ORDER — AMIODARONE HCL IN DEXTROSE 360-4.14 MG/200ML-% IV SOLN
INTRAVENOUS | Status: AC
  Filled 2020-04-08: qty 200

## 2020-04-08 MED ORDER — DIPHENHYDRAMINE HCL 25 MG PO CAPS
25.0000 mg | ORAL_CAPSULE | Freq: Four times a day (QID) | ORAL | Status: DC | PRN
  Administered 2020-04-08: 25 mg via ORAL
  Filled 2020-04-08: qty 1

## 2020-04-08 MED ORDER — AMIODARONE HCL IN DEXTROSE 360-4.14 MG/200ML-% IV SOLN
30.0000 mg/h | INTRAVENOUS | Status: DC
  Administered 2020-04-08: 30 mg/h via INTRAVENOUS

## 2020-04-08 MED ORDER — INSULIN ASPART 100 UNIT/ML ~~LOC~~ SOLN
0.0000 [IU] | Freq: Three times a day (TID) | SUBCUTANEOUS | Status: DC
  Administered 2020-04-08: 0 [IU] via SUBCUTANEOUS

## 2020-04-08 MED ORDER — DEXMEDETOMIDINE HCL IN NACL 400 MCG/100ML IV SOLN
0.1000 ug/kg/h | INTRAVENOUS | Status: AC
  Administered 2020-04-08: 0.4 ug/kg/h via INTRAVENOUS

## 2020-04-08 MED ORDER — INSULIN ASPART 100 UNIT/ML ~~LOC~~ SOLN
0.0000 [IU] | Freq: Every day | SUBCUTANEOUS | Status: DC
  Administered 2020-04-08: 1 [IU] via SUBCUTANEOUS

## 2020-04-08 NOTE — Progress Notes (Signed)
Progress Note  Patient Name: Jordan [name] Date of Encounter: 04/08/2020  Primary Cardiologist: New to [facility]   Subjective   Patient alert this am. Oriented. Wants to eat. Denies SOB.     Inpatient Medications    Scheduled Meds: . amiodarone  200 mg Oral BID  . carvedilol  3.125 mg Oral BID WC  . Chlorhexidine Gluconate Cloth  6 each Topical Q0600  . folic acid  1 mg Oral Daily  . heparin injection (subcutaneous)  5,000 Units Subcutaneous Q8H  . insulin aspart  0-6 Units Subcutaneous Q4H  . multivitamin with minerals  1 tablet Oral Daily  . pantoprazole sodium  40 mg Oral Daily  . sevelamer carbonate  800 mg Oral TID WC  . sodium chloride flush  3 mL Intravenous Q12H  . thiamine  100 mg Oral Daily   Continuous Infusions: . sodium chloride    . sodium chloride    . sodium chloride    . sodium chloride    . sodium chloride    . amiodarone 30 mg/hr (04/08/20 0800)  . dexmedetomidine (PRECEDEX) IV infusion 0.4 mcg/kg/hr (04/08/20 0800)   PRN Meds: sodium chloride, sodium chloride, sodium chloride, sodium chloride, sodium chloride, acetaminophen, camphor-menthol, heparin, hydrALAZINE, ipratropium-albuterol, metoprolol tartrate, ondansetron (ZOFRAN) IV, pentafluoroprop-tetrafluoroeth, sodium chloride flush   Vital Signs    Vitals:   04/08/20 0608 04/08/20 0630 04/08/20 0700 04/08/20 0800  BP:  129/80 106/68 104/64  Pulse: 89 84 74 71  Resp: (!) 26 18 18 20   Temp:   98.9 F (37.2 C)   TempSrc:   Oral   SpO2: 96% 92% 96% 96%  Weight:      Height:        Intake/Output Summary (Last 24 hours) at 04/08/2020 0840 Last data filed at 04/08/2020 0800 Gross per 24 hour  Intake 252.34 ml  Output 2530 ml  Net -2277.66 ml   Filed Weights   04/07/20 1330 04/07/20 1726 04/08/20 0437  Weight: 70.1 kg 67.6 kg 96.5 kg    Physical Exam   General: Ill appearing, on [place] oxygen Neck: Negative for carotid bruits. No JVD Lungs:Clear to ausculation bilaterally.   Cardiovascular: RRR with S1 S2. No murmurs Abdomen: Soft, non-tender, non-distended. No obvious abdominal masses. Extremities: No edema. Radial pulses 2+ bilaterally Neuro: Intubated not responsive   Labs    Chemistry Recent Labs  Lab 04/05/20 0500 04/05/20 0500 04/06/20 0506 04/06/20 0506 04/07/20 1937 04/07/20 0838 04/07/20 1159 04/07/20 2054 04/08/20 0350  NA 141   < > 139   < > 139   < > 138 139 136  K 4.0   < > 3.4*   < > 4.6   < > 4.0 3.8 3.7  CL 98   < > 97*   < > 97*  --  97*  --  97*  CO2 27   < > 26   < > 29  --  25  --  27  GLUCOSE 82   < > 118*   < > 182*  --  114*  --  77  BUN 59*   < > 40*   < > 32*  --  35*  --  20  CREATININE 9.01*   < > 6.97*   < > 6.50*  --  6.57*  --  4.63*  CALCIUM 9.4   < > 9.2   < > 9.6  --  8.9  --  9.3  PROT 7.1  --  7.3  --  7.8  --   --   --   --   ALBUMIN 3.2*   < > 3.4*  --  3.5  --  2.9*  --   --   AST 16  --  22  --  20  --   --   --   --   ALT 16  --  18  --  18  --   --   --   --   ALKPHOS 102  --  106  --  128*  --   --   --   --   BILITOT 1.9*  --  1.6*  --  1.3*  --   --   --   --   GFRNONAA 6*   < > 8*   < > 9*  --  9*  --  14*  GFRAA 7*   < > 10*   < > 10*  --  10*  --  16*  ANIONGAP 16*   < > 16*   < > 13  --  16*  --  12   < > = values in this interval not displayed.     Hematology Recent Labs  Lab 04/05/20 0500 04/05/20 0500 04/06/20 0506 04/07/20 0838 04/07/20 2054  WBC 7.2  --  8.6 10.4  --   RBC 2.58*  --  2.60* 2.79*  --   HGB 8.3*   < > 8.5* 8.8* 9.2*  HCT 26.3*   < > 27.3* 30.4* 27.0*  MCV 101.9*  --  105.0* 109.0*  --   MCH 32.2  --  32.7 31.5  --   MCHC 31.6  --  31.1 28.9*  --   RDW 20.8*  --  19.7* 19.2*  --   PLT 126*  --  146* 188  --    < > = values in this interval not displayed.    Cardiac EnzymesNo results for input(s): TROPONINI in the last 168 hours. No results for input(s): TROPIPOC in the last 168 hours.   BNPNo results for input(s): BNP, PROBNP in the last 168 hours.    DDimer No results for input(s): DDIMER in the last 168 hours.   Radiology    DG CHEST PORT 1 VIEW  Result Date: 04/07/2020 CLINICAL DATA:  Shortness of breath. Congestive heart failure. EXAM: PORTABLE CHEST 1 VIEW COMPARISON:  April 06, 2020 FINDINGS: The cardiac silhouette is enlarged. Diffuse alveolar and interstitial opacities bilaterally have worsened from the most recent radiograph. Osseous structures are without acute abnormality. Soft tissues are grossly normal. IMPRESSION: Enlarged cardiac silhouette with worsening diffuse alveolar and interstitial opacities bilaterally, likely represent worsening mixed pattern pulmonary edema secondary to congestive heart failure. Electronically Signed   By: Fidela Salisbury M.D.   On: 04/07/2020 12:50   DG CHEST PORT 1 VIEW  Result Date: 04/07/2020 CLINICAL DATA:  Shortness of breath. EXAM: PORTABLE CHEST 1 VIEW COMPARISON:  Same day. FINDINGS: Stable cardiomegaly. Endotracheal and nasogastric tubes appear to be in grossly good position. No pneumothorax pleural effusion is noted. Hypoinflation of the lungs is noted with mild bibasilar subsegmental atelectasis. Bony thorax is unremarkable. IMPRESSION: Hypoinflation of the lungs with mild bibasilar subsegmental atelectasis. Aortic Atherosclerosis (ICD10-I70.0). Electronically Signed   By: Marijo Conception M.D.   On: 04/07/2020 10:32   Telemetry    NSR currently. Was in Afib intermittently during the night  HR 90-115.  - Personally Reviewed  ECG  No new tracing as of 04/06/20- Personally Reviewed  Cardiac Studies   Echocardiogram 04/05/2020:    1. Limited echo to assess for pericardial effusion and LV systolic  function   2. Difficult to assess LVEF given patient was in Afib with RVR during  study, but appears mildly reduced. Left ventricular ejection fraction, by  estimation, is 45 to 50%. The left ventricle has mildly decreased  function. The left ventricle demonstrates  global  hypokinesis. There is moderate left ventricular hypertrophy.   3. Right ventricular systolic function is mildly reduced. The right  ventricular size is moderately enlarged. There is moderately elevated  pulmonary artery systolic pressure. The estimated right ventricular  systolic pressure is 16.1 mmHg.   4. Small to moderate pericardial effusion measuring up to 1 cm adjacent  to LV lateral wall. Unable to assess for mitral inflow respiratory  variation given Afib. IVC is fixed/dilated, but no RA/RV collapse is seen  to suggest tamponade.   5. The inferior vena cava is dilated in size with <50% respiratory  variability, suggesting right atrial pressure of 15 mmHg.   6. Mitral regurgitation was not well evaluated on current study, but  noted to have eccentric MR on prior echo that by quantification was severe  (ERO 0.46 cm^2, RV 73cc). Consider TEE or cardiac MRI for further  evaluation      Echocardiogram 01/24/2020:   1. Left ventricular ejection fraction, by estimation, is 45 to 50%. The  left ventricle has mildly decreased function. The left ventricle  demonstrates global hypokinesis. There is moderate left ventricular  hypertrophy. Left ventricular diastolic  parameters are consistent with Grade III diastolic dysfunction  (restrictive). Elevated left ventricular end-diastolic pressure.   2. Right ventricular systolic function is normal. The right ventricular  size is normal. There is severely elevated pulmonary artery systolic  pressure.   3. Left atrial size was severely dilated.   4. Right atrial size was severely dilated.   5. The mitral valve is grossly normal. Mild mitral valve regurgitation.  No evidence of mitral stenosis.   6. Tricuspid valve regurgitation is moderate.   7. The aortic valve is tricuspid. Aortic valve regurgitation is not  visualized. Mild aortic valve sclerosis is present, with no evidence of  aortic valve stenosis.   8. The inferior vena cava is dilated  in size with <50% respiratory  variability, suggesting right atrial pressure of 15 mmHg.   Comparison(s): No prior Echocardiogram.   Patient Profile     54 y.o. male with a hx of ESRD noncompliant with HD, anemia of chronic kidney disease, recurrent hyperkalemia, secondary hyperparathyroidism, history of EtOH and tobacco abuse who is being seen today for the evaluation of new onset atrial fibrillation at the request of Dr. Alfredia Ferguson.  Assessment & Plan    1. New onset AF with RVR: -Started on low-dose Eliquis per primary team however suspect is not a candidate for chronic long-term anticoagulation given history of poor compliance and hemodialysis -Plan to repeat echocardiogram to rule out pericardial effusion as outpatient>>EF noted to be 45-50% with mildly decreased diastolic function, global hypokinesis, moderately elevated PA pressure at 9mmHg with small to moderate pericardial effusion with no RA/RV collapse to indicate cardiac tamponade. Mitral regurgitation was not well evaluated on current study. Reevaluate with outpatient Echo.  -Patient loaded with IV amiodarone infusion to help maintain NSR. Was switched to po amiodarone but then IV resumed with respiratory arrest yesterday. Still having some intermittent AFib but rate OK.  - When  able to take po's well can resume amiodarone 400 mg daily.  -HR improved with  low dose BB    2. Acute respiratory failure: respiratory arrest this am. -now self extubated. Suspect this is at least in part to respiratory depression from CIWA protocol. Plans per CCM. On [place] oxygen now.   3.  Acute on chronic combined systolic and diastolic CHF: -Prior echocardiogram from May 2021 with LVEF of 45 to 50%>> cardiology service not consulted at that time -Given history of noncompliance, likely poor candidate for further invasive study -Fuid volume managed with HD    4.  Hyperkalemia/hypokalemia: -Replacement with HD   5.  Anemia of chronic  disease: -Stabilizing>>hemoglobin, 9.2  this a.m. up from 6.7 yesterday  6. ESRD on HD. Per Nephrology   Signed, Arwin Bisceglia [place] MD, [facility]   04/08/2020, 8:40 AM     For questions or updates, please contact   Please consult [email or web address] for contact info under Cardiology/STEMI.

## 2020-04-08 NOTE — Progress Notes (Signed)
Pt becoming increasingly agitated & combative with staff.  Elink video surveillance per [employer], received Precedex gtts per Dr. Prudencio Burly.  Pt tolerating well, will continue to monitor.

## 2020-04-08 NOTE — Progress Notes (Signed)
[facility] Progress Note Patient Name: SAVIAN MAZON DOB: [birth year] MRN: [id number]   Date of Service  04/08/2020  HPI/Events of Note  Camera: agitated. Awake. Alert. Mentation improved. VS stable. Communication not coherent. Confused.  RN asking for ? Precedex.  On amiodarone gtt. HR 94. BP is fine. sats > 96%  eICU Interventions  - Precedex low dose gtt, watch for brady while on amiodarone. If worsens consider zyprexa.       Intervention Category Major Interventions: Delirium, psychosis, severe agitation - evaluation and management  Elmer Sow 04/08/2020, 5:52 AM

## 2020-04-08 NOTE — Progress Notes (Signed)
eLink Physician-Brief Progress Note Patient Name: Jordan [name] DOB: 1966/09/20 MRN: [id number]   Date of Service  04/08/2020  HPI/Events of Note  Patient complaining of generalized itchiness despite topical medication  eICU Interventions  Benadryl PO prn ordered     Intervention Category Minor Interventions: Routine modifications to care plan (e.g. PRN medications for pain, fever)  Shona Needles Genevie Elman 04/08/2020, 9:47 PM

## 2020-04-08 NOTE — Progress Notes (Signed)
Patient ID: Jordan [name], male   DOB: January 08, 1966, 54 y.o.   MRN: [id number] [facility] Progress Note   Assessment/ Plan:   1.  Acute hypoxic respiratory failure secondary to pulmonary edema/volume overload: He suffered acute respiratory failure earlier yesterday in the morning and by evening had self extubated himself-has remained on oxygen via nasal cannula and remains agitated/confused. 2. ESRD: Continue hemodialysis on Monday/Wednesday/Friday schedule with extra hemodialysis done yesterday for volume unloading/clearance.  I would recommend inpatient psychiatry consult as he may benefit from aggressive management of his anxiety/depression/passive suicidal behavior.  Adherence has been a challenge in the past and his pattern of rehospitalization will continue unless his mental health issues are addressed. 3. Anemia: Continue ESA while here in the hospital, previously poor adherence with dialysis unfortunately has resulted in inconsistent ESA delivery. 4. CKD-MBD: Calcium and phosphorus levels currently at goal, continue to monitor on low-dose sevelamer. 5.  New onset atrial fibrillation with rapid ventricular response: Seen by cardiology and echocardiogram report pending.  Now on oral amiodarone with rate control. 6. Hypertension: Blood pressure within acceptable range, continue to follow with ultrafiltration at hemodialysis.  Subjective:   Intubated yesterday morning for respiratory failure/altered mentation and transferred to ICU.  He self extubated himself later in the day and has remained on oxygen supplementation via nasal cannula.  Agitated and confused overnight.   Objective:   BP 106/68    Pulse 74    Temp 98.9 F (37.2 C) (Oral)    Resp 18    Ht 5\' 4"  (1.626 m)    Wt 96.5 kg    SpO2 96%    BMI 36.52 kg/m   Physical Exam: Gen: Awake and engaging in conversation with nurse when seen.  On oxygen via nasal cannula CVS: Pulse regular, normal rate, S1 and S2 normal Resp:  Anteriorly coarse breath sounds bilaterally without distinct rales/rhonchi Abd: Soft, obese, nontender, bowel sounds Ext: No lower extremity edema, left upper arm AV fistula with intact dressings  Labs: BMET Recent Labs  Lab 04/03/20 2230 04/03/20 2230 04/04/20 0500 04/05/20 0500 04/06/20 0506 04/07/20 0838 04/07/20 1159 04/07/20 2054 04/08/20 0350  NA 141   < > 140 141 139 139 138 139 136  K 4.2   < > 5.3* 4.0 3.4* 4.6 4.0 3.8 3.7  CL 101  --  100 98 97* 97* 97*  --  97*  CO2 21*  --  20* 27 26 29 25   --  27  GLUCOSE 167*  --  138* 82 118* 182* 114*  --  77  BUN 95*  --  100* 59* 40* 32* 35*  --  20  CREATININE 10.43*  --  12.51* 9.01* 6.97* 6.50* 6.57*  --  4.63*  CALCIUM 9.4  --  8.9 9.4 9.2 9.6 8.9  --  9.3  PHOS 4.4  --   --  6.1* 4.9* 6.1* 6.1*  --   --    < > = values in this interval not displayed.   CBC Recent Labs  Lab 04/03/20 1710 04/03/20 1737 04/04/20 1345 04/04/20 1345 04/05/20 0500 04/06/20 0506 04/07/20 0838 04/07/20 2054  WBC 7.1   < > 7.5  --  7.2 8.6 10.4  --   NEUTROABS 6.0  --   --   --  5.7 6.7 7.8*  --   HGB 7.1*   < > 6.7*   < > 8.3* 8.5* 8.8* 9.2*  HCT 25.0*   < > 21.9*   < >  26.3* 27.3* 30.4* 27.0*  MCV 116.3*   < > 106.3*  --  101.9* 105.0* 109.0*  --   PLT 165   < > 127*  --  126* 146* 188  --    < > = values in this interval not displayed.      Medications:     amiodarone  200 mg Oral BID   carvedilol  3.125 mg Oral BID WC   Chlorhexidine Gluconate Cloth  6 each Topical U0479   folic acid  1 mg Oral Daily   heparin injection (subcutaneous)  5,000 Units Subcutaneous Q8H   insulin aspart  0-6 Units Subcutaneous Q4H   multivitamin with minerals  1 tablet Oral Daily   pantoprazole sodium  40 mg Oral Daily   sevelamer carbonate  800 mg Oral TID WC   sodium chloride flush  3 mL Intravenous Q12H   thiamine  100 mg Oral Daily   Elmarie Shiley, MD 04/08/2020, 8:25 AM

## 2020-04-08 NOTE — Progress Notes (Signed)
NAME:  Jordan [name], MRN:  [id number], DOB:  21-Feb-1966, LOS: 4 ADMISSION DATE:  04/03/2020, CONSULTATION DATE:  04/07/2020 REFERRING MD:  Dr. Chana Bode, [facility], CHIEF COMPLAINT:  AMS   Brief History   54 yo male smoker with hx of ESRD on iHD at welfare check at home after missing HD for 2 weeks.  Found to have hypoxia from acute pulmonary edema, hyperkalemia and a fib with RVR.  Seen by nephrology and cardiology.  Developed agitation and confusion, and started on CIWA protocol.  Developed altered mental status and respiratory arrest requiring intubation 7/17 and transferred to ICU.  Past Medical History  ESRD, systolic/diastolic CHF, HTN, ETOH, Emphysema, ED  Significant Hospital Events   7/13 admit, iHD, Bipap 7/14 amiodarone gtt started, transfuse PRBC 7/15 CIWA protocol started 7/16 more confused/agitated 7/17 respiratory arrest/VDRF, transfer to ICU; self extubated himself 7/18 started on precedex  Consults:  Nephrology Cardiology  Procedures:  ETT 7/17 >> 7/17  Significant Diagnostic Tests:  Echo 7/15 >> EF 45 to 50%, mod LVH, PASP 53.7, small/mod pericardial effusion  Micro Data:  COVID 7/13 >> negative  MRSA PCR 7/17 >>   Antimicrobials:    Interim history/subjective:  precedex started overnight.  Asking for something to eat.  Objective   Blood pressure 104/64, pulse 71, temperature 98.9 F (37.2 C), temperature source Oral, resp. rate 20, height 5\' 4"  (1.626 m), weight 96.5 kg, SpO2 96 %.    Vent Mode: PRVC FiO2 (%):  [32 %-100 %] 32 % Set Rate:  [16 bmp-20 bmp] 20 bmp Vt Set:  [470 mL] 470 mL PEEP:  [5 cmH20] 5 cmH20 Plateau Pressure:  [19 cmH20-22 cmH20] 19 cmH20   Intake/Output Summary (Last 24 hours) at 04/08/2020 0917 Last data filed at 04/08/2020 0800 Gross per 24 hour  Intake 252.34 ml  Output 2530 ml  Net -2277.66 ml   Filed Weights   04/07/20 1330 04/07/20 1726 04/08/20 0437  Weight: 70.1 kg 67.6 kg 96.5 kg    Examination:  General -  sedated Eyes - pupils reactive ENT - no sinus tenderness, no stridor Cardiac - regular rate/rhythm, 2/6 SM Chest - equal breath sounds b/l, no wheezing or rales Abdomen - soft, non tender, + bowel sounds Extremities - no cyanosis, clubbing, or edema Skin - no rashes Neuro - RASS 0, follows commands  Resolved Hospital Problem list   Hyperkalemia, thrombocytopenia, Acute hypoxic respiratory failure with compromised airway from acute pulmonary edema  Assessment & Plan:   Acute metabolic encephalopathy 2nd to hypoxia, uremia. ETOH withdrawal. - continue precedex with RASS goal 0 to -1 - thiamine, folic acid  New onset a fib with RVR. Acute on chronic combined CHF. Pericardial effusion likely in setting of uremia. Hx of HTN. - amiodarone, coreg per cardiology - most likely not a suitable candidate for long term anticoagulation given history of non compliance  ESRD. Metabolic bone disease. - iHD per renal  Emphysema, tobacco abuse. - prn BDs  Anemia of critical illness and chronic disease. - f/u CBC intermittently - transfuse of Hb < 7 or significant bleeding  Hyperglycemia. - SSI  Best practice:  Diet: renal diet DVT prophylaxis: SQ heparin GI prophylaxis: protonix Mobility: bed rest Code Status: full code Disposition: ICU  Labs    CMP Latest Ref Rng & Units 04/08/2020 04/07/2020 04/07/2020  Glucose 70 - 99 mg/dL 77 - 114(H)  BUN 6 - 20 mg/dL 20 - 35(H)  Creatinine 0.61 - 1.24 mg/dL 4.63(H) - 6.57(H)  Sodium  135 - 145 mmol/L 136 139 138  Potassium 3.5 - 5.1 mmol/L 3.7 3.8 4.0  Chloride 98 - 111 mmol/L 97(L) - 97(L)  CO2 22 - 32 mmol/L 27 - 25  Calcium 8.9 - 10.3 mg/dL 9.3 - 8.9  Total Protein 6.5 - 8.1 g/dL - - -  Total Bilirubin 0.3 - 1.2 mg/dL - - -  Alkaline Phos 38 - 126 U/L - - -  AST 15 - 41 U/L - - -  ALT 0 - 44 U/L - - -    CBC Latest Ref Rng & Units 04/07/2020 04/07/2020 04/06/2020  WBC 4.0 - 10.5 K/uL - 10.4 8.6  Hemoglobin 13.0 - 17.0 g/dL 9.2(L)  8.8(L) 8.5(L)  Hematocrit 39 - 52 % 27.0(L) 30.4(L) 27.3(L)  Platelets 150 - 400 K/uL - 188 146(L)    ABG    Component Value Date/Time   PHART 7.321 (L) 04/07/2020 2054   PCO2ART 64.8 (H) 04/07/2020 2054   PO2ART 104 04/07/2020 2054   HCO3 33.5 (H) 04/07/2020 2054   TCO2 35 (H) 04/07/2020 2054   ACIDBASEDEF 0.3 04/05/2020 1435   O2SAT 97.0 04/07/2020 2054    CBG (last 3)  Recent Labs    04/07/20 2340 04/08/20 0307 04/08/20 0702  GLUCAP 74 83 76     Critical care time: 32 minutes  Chesley Mires, MD [place] Pager - [phone] 04/08/2020, 9:17 AM

## 2020-04-08 NOTE — Progress Notes (Signed)
Because pt NPO writer requesting amiodarone IV vs PO as ordered.  Pt VS stable on 3lpm [place] with improved mentation, but in atrial flutter, HR low 100's.  Received order accordingly from Dr. Prudencio Burly.

## 2020-04-08 NOTE — Progress Notes (Addendum)
eLink Physician-Brief Progress Note Patient Name: Jordan [name] DOB: [birth year] MRN: [id number]   Date of Service  04/08/2020  HPI/Events of Note  NPO. menation better on camera eval. amio 200 pending orally, asking for gtt. HR 80 to 110. MAP > 70  eICU Interventions  - start amio 0.5 mg/hr gtt for now.      Intervention Category Intermediate Interventions: Arrhythmia - evaluation and management  Elmer Sow 04/08/2020, 12:47 AM   1 AM ABG type 2 failure, sats are improved as per bed side RN. If worsens re intubate.

## 2020-04-09 ENCOUNTER — Encounter (HOSPITAL_COMMUNITY): Payer: Self-pay | Admitting: Internal Medicine

## 2020-04-09 DIAGNOSIS — D631 Anemia in chronic kidney disease: Secondary | ICD-10-CM

## 2020-04-09 DIAGNOSIS — F329 Major depressive disorder, single episode, unspecified: Secondary | ICD-10-CM

## 2020-04-09 DIAGNOSIS — F32A Depression, unspecified: Secondary | ICD-10-CM | POA: Diagnosis present

## 2020-04-09 DIAGNOSIS — I5043 Acute on chronic combined systolic (congestive) and diastolic (congestive) heart failure: Secondary | ICD-10-CM

## 2020-04-09 DIAGNOSIS — N186 End stage renal disease: Secondary | ICD-10-CM

## 2020-04-09 DIAGNOSIS — I10 Essential (primary) hypertension: Secondary | ICD-10-CM

## 2020-04-09 DIAGNOSIS — E8779 Other fluid overload: Secondary | ICD-10-CM

## 2020-04-09 DIAGNOSIS — I4891 Unspecified atrial fibrillation: Secondary | ICD-10-CM

## 2020-04-09 DIAGNOSIS — J810 Acute pulmonary edema: Secondary | ICD-10-CM

## 2020-04-09 DIAGNOSIS — R410 Disorientation, unspecified: Secondary | ICD-10-CM

## 2020-04-09 DIAGNOSIS — I5023 Acute on chronic systolic (congestive) heart failure: Secondary | ICD-10-CM

## 2020-04-09 DIAGNOSIS — Z9115 Patient's noncompliance with renal dialysis: Secondary | ICD-10-CM

## 2020-04-09 DIAGNOSIS — F320 Major depressive disorder, single episode, mild: Secondary | ICD-10-CM | POA: Diagnosis present

## 2020-04-09 LAB — GLUCOSE, CAPILLARY
Glucose-Capillary: 100 mg/dL — ABNORMAL HIGH (ref 70–99)
Glucose-Capillary: 124 mg/dL — ABNORMAL HIGH (ref 70–99)
Glucose-Capillary: 98 mg/dL (ref 70–99)
Glucose-Capillary: 68 mg/dL — ABNORMAL LOW (ref 70–99)
Glucose-Capillary: 144 mg/dL — ABNORMAL HIGH (ref 70–99)
Glucose-Capillary: 115 mg/dL — ABNORMAL HIGH (ref 70–99)
Glucose-Capillary: 82 mg/dL (ref 70–99)

## 2020-04-09 LAB — RENAL FUNCTION PANEL
Albumin: 3.1 g/dL — ABNORMAL LOW (ref 3.5–5.0)
Anion gap: 14 (ref 5–15)
BUN: 40 mg/dL — ABNORMAL HIGH (ref 6–20)
CO2: 27 mmol/L (ref 22–32)
Calcium: 9.1 mg/dL (ref 8.9–10.3)
Chloride: 97 mmol/L — ABNORMAL LOW (ref 98–111)
Creatinine, Ser: 6.77 mg/dL — ABNORMAL HIGH (ref 0.61–1.24)
GFR calc Af Amer: 10 mL/min — ABNORMAL LOW (ref >60)
GFR calc non Af Amer: 9 mL/min — ABNORMAL LOW (ref >60)
Glucose, Bld: 108 mg/dL — ABNORMAL HIGH (ref 70–99)
Phosphorus: 3 mg/dL (ref 2.5–4.6)
Potassium: 3.7 mmol/L (ref 3.5–5.1)
Sodium: 138 mmol/L (ref 135–145)

## 2020-04-09 LAB — CBC
HCT: 24.5 % — ABNORMAL LOW (ref 39.0–52.0)
Hemoglobin: 7.7 g/dL — ABNORMAL LOW (ref 13.0–17.0)
MCH: 32 pg (ref 26.0–34.0)
MCHC: 31.4 g/dL (ref 30.0–36.0)
MCV: 101.7 fL — ABNORMAL HIGH (ref 80.0–100.0)
Platelets: 169 10*3/uL (ref 150–400)
RBC: 2.41 MIL/uL — ABNORMAL LOW (ref 4.22–5.81)
RDW: 18.5 % — ABNORMAL HIGH (ref 11.5–15.5)
WBC: 8.5 10*3/uL (ref 4.0–10.5)
nRBC: 0.2 % (ref 0.0–0.2)

## 2020-04-09 MED ORDER — HYDROXYZINE HCL 10 MG PO TABS
10.0000 mg | ORAL_TABLET | Freq: Three times a day (TID) | ORAL | Status: DC | PRN
  Administered 2020-04-09: 10 mg via ORAL
  Filled 2020-04-09 (×2): qty 1

## 2020-04-09 MED ORDER — ALBUMIN HUMAN 25 % IV SOLN
25.0000 g | Freq: Once | INTRAVENOUS | Status: DC
  Filled 2020-04-09: qty 100

## 2020-04-09 MED ORDER — HALOPERIDOL LACTATE 5 MG/ML IJ SOLN: 2.0000 mg | Freq: Once | INTRAMUSCULAR | Status: AC

## 2020-04-09 MED ORDER — DEXMEDETOMIDINE HCL IN NACL 400 MCG/100ML IV SOLN
0.4000 ug/kg/h | INTRAVENOUS | Status: DC
  Administered 2020-04-09: 1.2 ug/kg/h via INTRAVENOUS
  Administered 2020-04-09 (×2): 0.4 ug/kg/h via INTRAVENOUS
  Administered 2020-04-10: 1.2 ug/kg/h via INTRAVENOUS
  Administered 2020-04-10: 1 ug/kg/h via INTRAVENOUS
  Filled 2020-04-09: qty 100
  Filled 2020-04-09: qty 200
  Filled 2020-04-09 (×2): qty 100

## 2020-04-09 MED ORDER — ALBUMIN HUMAN 25 % IV SOLN
25.0000 g | Freq: Once | INTRAVENOUS | Status: AC
  Administered 2020-04-09: 25 g via INTRAVENOUS

## 2020-04-09 MED ORDER — LORAZEPAM 2 MG/ML IJ SOLN
1.0000 mg | INTRAMUSCULAR | Status: DC | PRN
  Administered 2020-04-09 – 2020-04-10 (×3): 2 mg via INTRAVENOUS
  Filled 2020-04-09 (×5): qty 1

## 2020-04-09 MED ORDER — DEXTROSE 50 % IV SOLN
12.5000 g | INTRAVENOUS | Status: AC
  Administered 2020-04-09: 12.5 g via INTRAVENOUS
  Filled 2020-04-09: qty 50

## 2020-04-09 MED ORDER — DIPHENHYDRAMINE HCL 50 MG/ML IJ SOLN
25.0000 mg | Freq: Once | INTRAMUSCULAR | Status: DC
  Filled 2020-04-09: qty 1

## 2020-04-09 MED ORDER — OLANZAPINE 5 MG PO TBDP
2.5000 mg | ORAL_TABLET | Freq: Every day | ORAL | Status: DC
  Administered 2020-04-09: 2.5 mg via ORAL
  Filled 2020-04-09 (×2): qty 0.5

## 2020-04-09 MED ORDER — DIPHENHYDRAMINE HCL 50 MG/ML IJ SOLN
25.0000 mg | Freq: Four times a day (QID) | INTRAMUSCULAR | Status: DC | PRN
  Filled 2020-04-09: qty 1

## 2020-04-09 MED ORDER — HALOPERIDOL LACTATE 5 MG/ML IJ SOLN
INTRAMUSCULAR | Status: AC
  Administered 2020-04-09: 2 mg via INTRAMUSCULAR
  Filled 2020-04-09: qty 1

## 2020-04-09 MED ORDER — CARVEDILOL 12.5 MG PO TABS: 6.2500 mg | ORAL_TABLET | Freq: Two times a day (BID) | ORAL | Status: DC

## 2020-04-09 MED ORDER — DIPHENHYDRAMINE HCL 50 MG/ML IJ SOLN
25.0000 mg | Freq: Four times a day (QID) | INTRAMUSCULAR | Status: DC | PRN
  Administered 2020-04-09: 25 mg via INTRAVENOUS
  Filled 2020-04-09: qty 1

## 2020-04-09 MED ORDER — QUETIAPINE FUMARATE 50 MG PO TABS
25.0000 mg | ORAL_TABLET | Freq: Two times a day (BID) | ORAL | Status: DC
  Administered 2020-04-09 – 2020-04-10 (×3): 25 mg via ORAL
  Filled 2020-04-09 (×3): qty 1

## 2020-04-09 NOTE — Progress Notes (Signed)
Patient ID: Jordan [name], male   DOB: [birth year], 54 y.o.   MRN: [id number] [facility] Progress Note   Assessment/ Plan:   1.  Acute hypoxic respiratory failure secondary to pulmonary edema/volume overload: He suffered acute respiratory failure and was intubated; by evening had self extubated himself.  Supportive care with oxygen as needed   2. ESRD: Continue hemodialysis on Monday/Wednesday/Friday schedule.  Would recommend inpatient psychiatry consult as he may benefit from aggressive management of his anxiety/depression/passive suicidal behavior.  Adherence has been a challenge in the past and his pattern of rehospitalization will continue unless his mental health issues are addressed.  3. Anemia of ESRD: Continue ESA while here in the hospital, previously poor adherence with dialysis unfortunately has resulted in inconsistent ESA delivery.  4. CKD-MBD: Calcium and phosphorus levels currently at goal, continue to monitor on sevelamer.  5.  New onset atrial fibrillation with rapid ventricular response: Seen by cardiology.  Now on oral amiodarone   6. Hypertension: Blood pressure within acceptable range, continue to follow with ultrafiltration at hemodialysis.  Subjective:   Last HD on 7/17 with 2.5 kg UF. Agitation and threatened to leave ICU AMA overnight.  He denies complaints to me this morning  Review of systems:  Denies shortness of breath  No chest pain  no n/v    Objective:   BP 112/66   Pulse 73   Temp 98.8 F (37.1 C) (Oral)   Resp (!) 22   Ht 5\' 4"  (1.626 m)   Wt 96.2 kg   SpO2 90%   BMI 36.40 kg/m   Physical Exam: Gen: adult male in bed with NAD CVS: S1 and S2 ; no rub Resp clear to auscultation and unlabored; on 2 liters [place] Abd: Soft nontender ND Ext: No lower extremity edema Neuro - awake and conversant; provides hx and follows commands Access: left upper arm AV fistula with bruit and thrill    Labs: BMET Recent Labs  Lab  04/03/20 2230 04/03/20 2230 04/04/20 0500 04/04/20 0500 04/05/20 0500 04/06/20 0506 04/07/20 0838 04/07/20 1159 04/07/20 2054 04/08/20 0350 04/09/20 0258  NA 141   < > 140   < > 141 139 139 138 139 136 138  K 4.2   < > 5.3*   < > 4.0 3.4* 4.6 4.0 3.8 3.7 3.7  CL 101   < > 100  --  98 97* 97* 97*  --  97* 97*  CO2 21*   < > 20*  --  27 26 29 25   --  27 27  GLUCOSE 167*   < > 138*  --  82 118* 182* 114*  --  77 108*  BUN 95*   < > 100*  --  59* 40* 32* 35*  --  20 40*  CREATININE 10.43*   < > 12.51*  --  9.01* 6.97* 6.50* 6.57*  --  4.63* 6.77*  CALCIUM 9.4   < > 8.9  --  9.4 9.2 9.6 8.9  --  9.3 9.1  PHOS 4.4  --   --   --  6.1* 4.9* 6.1* 6.1*  --   --  3.0   < > = values in this interval not displayed.   CBC Recent Labs  Lab 04/03/20 1710 04/03/20 1737 04/05/20 0500 04/05/20 0500 04/06/20 0506 04/07/20 0838 04/07/20 2054 04/09/20 0258  WBC 7.1   < > 7.2  --  8.6 10.4  --  8.5  NEUTROABS 6.0  --  5.7  --  6.7 7.8*  --   --   HGB 7.1*   < > 8.3*   < > 8.5* 8.8* 9.2* 7.7*  HCT 25.0*   < > 26.3*   < > 27.3* 30.4* 27.0* 24.5*  MCV 116.3*   < > 101.9*  --  105.0* 109.0*  --  101.7*  PLT 165   < > 126*  --  146* 188  --  169   < > = values in this interval not displayed.      Medications:    . amiodarone  200 mg Oral BID  . carvedilol  3.125 mg Oral BID WC  . Chlorhexidine Gluconate Cloth  6 each Topical Q0600  . diphenhydrAMINE  25 mg Intravenous Once  . folic acid  1 mg Oral Daily  . heparin injection (subcutaneous)  5,000 Units Subcutaneous Q8H  . insulin aspart  0-5 Units Subcutaneous QHS  . insulin aspart  0-6 Units Subcutaneous TID WC  . multivitamin with minerals  1 tablet Oral Daily  . pantoprazole sodium  40 mg Oral Daily  . sevelamer carbonate  800 mg Oral TID WC  . sodium chloride flush  3 mL Intravenous Q12H  . thiamine  100 mg Oral Daily   Claudia Desanctis, MD 04/09/2020, 6:31 AM

## 2020-04-09 NOTE — Progress Notes (Signed)
Attempted to give patient IV Benadryl but he refused. He stated, "Do not get close to me, and I don't want the damn Benadryl."

## 2020-04-09 NOTE — Consult Note (Addendum)
[facility] Consult   Reason for Consult:  Rule out  Major depression with psychosis vs. Delirium  Referring Physician:  Julian Hy, DO Patient Identification: Jordan [name] MRN:  [id number] Principal Diagnosis: Acute on chronic congestive heart failure ([place]) Diagnosis:  Principal Problem:   Acute on chronic congestive heart failure ([place]) Active Problems:   ESRD on dialysis [facility])   Essential hypertension   Anemia in ESRD (end-stage renal disease) ([place])   Fluid overload   Hyperkalemia   Non-compliance with renal dialysis ([place])   Atrial fibrillation with RVR ([place])   Acute on chronic systolic CHF (congestive heart failure) ([place])   Depression   Current mild episode of major depressive disorder without prior episode ([place])   Total Time spent with patient: 30 minutes  Subjective:   Jordan [name] is a 54 y.o. male patient admitted to [facility].  Per MD progress note:  54 yo male smoker with hx of ESRD on iHD at welfare check at home after missing HD for 2 weeks.  Found to have hypoxia from acute pulmonary edema, hyperkalemia and a fib with RVR.  Seen by nephrology and cardiology.  Developed agitation and confusion, and started on CIWA protocol.  Developed altered mental status and respiratory arrest requiring intubation 7/17 and transferred to ICU.   HPI:  Jordan [name], 54 y.o., male patient seen face to face by this provider, consulted with Dr. Dwyane Dee; and chart reviewed on 04/09/20.  On evaluation Jordan [name] is in bed; he is in/out of sleep during this assessment, having to wake several times for him to answer questions.  Patient was given Haldol prior to this writers arrival and is also receiving Precedex which is making drowsy.  Patient states that he lives alone in [place], [place] but is close to family.  Patient denies suicidal/self-harm/homicidal ideation and paranoia. Patient does states that he hears voices in his head telling him "I don't like that  mother-fucker, and I don't like being around him (referring to himself).  Unable to get much information from patient; not a good historian at this time.  Spoke to patients nurse Providence Lanius, RN and Dr. Noemi Chapel:  Reporting that patient was medicated related wanting to leave the hospital, worsening agitation and aggression, threatening to hit staff related to wanting to leave.  States that they spoke with patients family who informed that patient lost his brother in a car accident last year, Son passed last year, and mother recently lost his mother which they feel is when patient gave up and missed 2 weeks of his dialysis.  Dr. Carlis Abbott wanting to rule out major depression with psychosis vs. Delirium.  Patient has no prior psychiatric history.  Dr. Carlis Abbott has started Seroquel Bid.  Will not make any changes at this time since the Seroquel was just started.  Suggested Zyprexa 2.5 mg Q hs if Seroquel is not working.  Either should help with mood stability, depression, and psychosis (whether depression induced or delirium).           Past Psychiatric History: No prior psychiatric history  Risk to Self:  No Risk to Others:  No Prior Inpatient Therapy:  No Prior Outpatient Therapy:  No  Past Medical History:  Past Medical History:  Diagnosis Date  . Anemia   . Aortic atherosclerosis ([place]) 11/12/2019  . CKD (chronic kidney disease)    Stage 5  Dialysis - M/W/F in [place], [place]  . Dyspnea    tx with inhaler when  sick  . ED (erectile dysfunction)   . Emphysema of lung ([place]) 11/12/2019  . ETOH abuse   . History of blood transfusion   . Hypertension   . Wears dentures     Past Surgical History:  Procedure Laterality Date  . BASCILIC VEIN TRANSPOSITION Left 08/07/2016   Procedure: LEFT BASILIC VEIN TRANSPOSITION;  Surgeon: Angelia Mould, MD;  Location: [facility];  Service: Vascular;  Laterality: Left;  . CLOSED REDUCTION NASAL FRACTURE N/A 08/11/2019   Procedure: CLOSED REDUCTION NASAL FRACTURE WITH  STABILIZATION;  Surgeon: Irene Limbo, MD;  Location: [facility];  Service: Plastics;  Laterality: N/A;  . COLONOSCOPY N/A 08/12/2016   Procedure: COLONOSCOPY;  Surgeon: Otis Brace, MD;  Location: [facility];  Service: Gastroenterology;  Laterality: N/A;  . ESOPHAGOGASTRODUODENOSCOPY N/A 08/12/2016   Procedure: ESOPHAGOGASTRODUODENOSCOPY (EGD);  Surgeon: Otis Brace, MD;  Location: [facility];  Service: Gastroenterology;  Laterality: N/A;  . INSERTION OF DIALYSIS CATHETER N/A 08/07/2016   Procedure: INSERTION OF TUNNELED DIALYSIS CATHETER;  Surgeon: Angelia Mould, MD;  Location: [facility];  Service: Vascular;  Laterality: N/A;  . LIGATION OF ARTERIOVENOUS  FISTULA Left 09/12/2016   Procedure: BANDING OF LEFT  ARTERIOVENOUS  FISTULA;  Surgeon: Angelia Mould, MD;  Location: [facility];  Service: Vascular;  Laterality: Left;  . LOWER EXTREMITY INTERVENTION Right 12/02/2018   Procedure: LOWER EXTREMITY INTERVENTION;  Surgeon: Algernon Huxley, MD;  Location: [facility];  Service: Cardiovascular;  Laterality: Right;   Family History:  Family History  Problem Relation Age of Onset  . Diabetes Mother   . Kidney failure Mother   . Healthy Father   . Kidney failure Brother   . Healthy Sister   . Kidney disease Daughter   . Post-traumatic stress disorder Neg Hx   . Bladder Cancer Neg Hx   . Kidney cancer Neg Hx    Family Psychiatric  History: None reported Social History:  Social History   Substance and Sexual Activity  Alcohol Use Yes  . Alcohol/week: 21.0 standard drinks  . Types: 21 Cans of beer per week   Comment: none since 08/04/16     Social History   Substance and Sexual Activity  Drug Use No    Social History   Socioeconomic History  . Marital status: Single    Spouse name: Not on file  . Number of children: Not on file  . Years of education: Not on file  . Highest education level: Not on file  Occupational History  . Not on file  Tobacco  Use  . Smoking status: Current Every Day Smoker    Packs/day: 0.50    Years: 40.00    Pack years: 20.00    Types: Cigarettes  . Smokeless tobacco: Never Used  Vaping Use  . Vaping Use: Never used  Substance and Sexual Activity  . Alcohol use: Yes    Alcohol/week: 21.0 standard drinks    Types: 21 Cans of beer per week    Comment: none since 08/04/16  . Drug use: No  . Sexual activity: Not Currently  Other Topics Concern  . Not on file  Social History Narrative  . Not on file   Social Determinants of Health   Financial Resource Strain:   . Difficulty of Paying Living Expenses:   Food Insecurity:   . Worried About Charity fundraiser in the Last Year:   . Arboriculturist in the Last Year:   Transportation Needs:   .  Lack of Transportation (Medical):   Marland Kitchen Lack of Transportation (Non-Medical):   Physical Activity:   . Days of Exercise per Week:   . Minutes of Exercise per Session:   Stress:   . Feeling of Stress :   Social Connections:   . Frequency of Communication with Friends and Family:   . Frequency of Social Gatherings with Friends and Family:   . Attends Religious Services:   . Active Member of Clubs or Organizations:   . Attends Archivist Meetings:   Marland Kitchen Marital Status:    Additional Social History:    Allergies:  No Known Allergies  Labs:  Results for orders placed or performed during the hospital encounter of 04/03/20 (from the past 48 hour(s))  Glucose, capillary     Status: None   Collection Time: 04/07/20  3:28 PM  Result Value Ref Range   Glucose-Capillary 82 70 - 99 mg/dL    Comment: Glucose reference range applies only to samples taken after fasting for at least 8 hours.  Glucose, capillary     Status: None   Collection Time: 04/07/20  7:21 PM  Result Value Ref Range   Glucose-Capillary 89 70 - 99 mg/dL    Comment: Glucose reference range applies only to samples taken after fasting for at least 8 hours.  I-STAT 7, (LYTES, BLD GAS, ICA,  H+H)     Status: Abnormal   Collection Time: 04/07/20  8:54 PM  Result Value Ref Range   pH, Arterial 7.321 (L) 7.35 - 7.45   pCO2 arterial 64.8 (H) 32 - 48 mmHg   pO2, Arterial 104 83 - 108 mmHg   Bicarbonate 33.5 (H) 20.0 - 28.0 mmol/L   TCO2 35 (H) 22 - 32 mmol/L   O2 Saturation 97.0 %   Acid-Base Excess 6.0 (H) 0.0 - 2.0 mmol/L   Sodium 139 135 - 145 mmol/L   Potassium 3.8 3.5 - 5.1 mmol/L   Calcium, Ion 1.26 1.15 - 1.40 mmol/L   HCT 27.0 (L) 39 - 52 %   Hemoglobin 9.2 (L) 13.0 - 17.0 g/dL   Patient temperature 98.6 F    Collection site Radial    Drawn by RT    Sample type ARTERIAL   Glucose, capillary     Status: None   Collection Time: 04/07/20 11:40 PM  Result Value Ref Range   Glucose-Capillary 74 70 - 99 mg/dL    Comment: Glucose reference range applies only to samples taken after fasting for at least 8 hours.  Glucose, capillary     Status: None   Collection Time: 04/08/20  3:07 AM  Result Value Ref Range   Glucose-Capillary 83 70 - 99 mg/dL    Comment: Glucose reference range applies only to samples taken after fasting for at least 8 hours.  Basic metabolic panel     Status: Abnormal   Collection Time: 04/08/20  3:50 AM  Result Value Ref Range   Sodium 136 135 - 145 mmol/L   Potassium 3.7 3.5 - 5.1 mmol/L   Chloride 97 (L) 98 - 111 mmol/L   CO2 27 22 - 32 mmol/L   Glucose, Bld 77 70 - 99 mg/dL    Comment: Glucose reference range applies only to samples taken after fasting for at least 8 hours.   BUN 20 6 - 20 mg/dL   Creatinine, Ser 4.63 (H) 0.61 - 1.24 mg/dL   Calcium 9.3 8.9 - 10.3 mg/dL   GFR calc non Af Amer 14 (L) >  60 mL/min   GFR calc Af Amer 16 (L) >60 mL/min   Anion gap 12 5 - 15    Comment: Performed at [facility] [address] [place], [place] [address]  Glucose, capillary     Status: None   Collection Time: 04/08/20  7:02 AM  Result Value Ref Range   Glucose-Capillary 76 70 - 99 mg/dL    Comment: Glucose reference range applies only to  samples taken after fasting for at least 8 hours.  Glucose, capillary     Status: Abnormal   Collection Time: 04/08/20 11:07 AM  Result Value Ref Range   Glucose-Capillary 110 (H) 70 - 99 mg/dL    Comment: Glucose reference range applies only to samples taken after fasting for at least 8 hours.  Glucose, capillary     Status: Abnormal   Collection Time: 04/08/20  3:27 PM  Result Value Ref Range   Glucose-Capillary 126 (H) 70 - 99 mg/dL    Comment: Glucose reference range applies only to samples taken after fasting for at least 8 hours.  Glucose, capillary     Status: Abnormal   Collection Time: 04/08/20  7:36 PM  Result Value Ref Range   Glucose-Capillary 147 (H) 70 - 99 mg/dL    Comment: Glucose reference range applies only to samples taken after fasting for at least 8 hours.   Comment 1 Notify RN   Renal function panel     Status: Abnormal   Collection Time: 04/09/20  2:58 AM  Result Value Ref Range   Sodium 138 135 - 145 mmol/L   Potassium 3.7 3.5 - 5.1 mmol/L   Chloride 97 (L) 98 - 111 mmol/L   CO2 27 22 - 32 mmol/L   Glucose, Bld 108 (H) 70 - 99 mg/dL    Comment: Glucose reference range applies only to samples taken after fasting for at least 8 hours.   BUN 40 (H) 6 - 20 mg/dL   Creatinine, Ser 6.77 (H) 0.61 - 1.24 mg/dL   Calcium 9.1 8.9 - 10.3 mg/dL   Phosphorus 3.0 2.5 - 4.6 mg/dL   Albumin 3.1 (L) 3.5 - 5.0 g/dL   GFR calc non Af Amer 9 (L) >60 mL/min   GFR calc Af Amer 10 (L) >60 mL/min   Anion gap 14 5 - 15    Comment: Performed at [facility] [address] [place], [place] [address]  CBC     Status: Abnormal   Collection Time: 04/09/20  2:58 AM  Result Value Ref Range   WBC 8.5 4.0 - 10.5 K/uL   RBC 2.41 (L) 4.22 - 5.81 MIL/uL   Hemoglobin 7.7 (L) 13.0 - 17.0 g/dL   HCT 24.5 (L) 39 - 52 %   MCV 101.7 (H) 80.0 - 100.0 fL   MCH 32.0 26.0 - 34.0 pg   MCHC 31.4 30.0 - 36.0 g/dL   RDW 18.5 (H) 11.5 - 15.5 %   Platelets 169 150 - 400 K/uL   nRBC 0.2 0.0  - 0.2 %    Comment: Performed at [facility], [place] [address] [place], [place] [address]  Glucose, capillary     Status: Abnormal   Collection Time: 04/09/20  4:04 AM  Result Value Ref Range   Glucose-Capillary 100 (H) 70 - 99 mg/dL    Comment: Glucose reference range applies only to samples taken after fasting for at least 8 hours.  Glucose, capillary     Status:  Abnormal   Collection Time: 04/09/20 11:09 AM  Result Value Ref Range   Glucose-Capillary 124 (H) 70 - 99 mg/dL    Comment: Glucose reference range applies only to samples taken after fasting for at least 8 hours.    Current Facility-Administered Medications  Medication Dose Route Frequency Provider Last Rate Last Admin  . 0.9 %  sodium chloride infusion  250 mL Intravenous PRN Chotiner, Yevonne Aline, MD      . 0.9 %  sodium chloride infusion  100 mL Intravenous PRN Dwana Melena, MD      . 0.9 %  sodium chloride infusion  100 mL Intravenous PRN Dwana Melena, MD      . 0.9 %  sodium chloride infusion  100 mL Intravenous PRN Valentina Gu, NP      . 0.9 %  sodium chloride infusion  100 mL Intravenous PRN Valentina Gu, NP      . acetaminophen (TYLENOL) tablet 650 mg  650 mg Oral Q4H PRN Chesley Mires, MD      . amiodarone (PACERONE) tablet 200 mg  200 mg Oral BID Chesley Mires, MD   200 mg at 04/09/20 0802  . camphor-menthol (SARNA) lotion   Topical PRN Lang Snow, FNP   Given at 04/05/20 0155  . carvedilol (COREG) tablet 6.25 mg  6.25 mg Oral BID WC Dorothy Spark, MD      . Chlorhexidine Gluconate Cloth 2 % PADS 6 each  6 each Topical Q0600 Roney Jaffe, MD   6 each at 04/08/20 0200  . dexmedetomidine (PRECEDEX) 400 MCG/100ML (4 mcg/mL) infusion  0.4-1.2 mcg/kg/hr Intravenous Titrated Desai, Rahul P, PA-C 29 mL/hr at 04/09/20 1200 1.2 mcg/kg/hr at 04/09/20 1200  . diphenhydrAMINE (BENADRYL) capsule 25 mg  25 mg Oral Q6H PRN Judd Lien, MD   25 mg at 04/08/20 2201  . diphenhydrAMINE (BENADRYL)  injection 25 mg  25 mg Intravenous Once Eliezer Bottom T, MD      . diphenhydrAMINE (BENADRYL) injection 25-50 mg  25-50 mg Intravenous Q6H PRN Noemi Chapel P, DO   25 mg at 04/09/20 1213  . folic acid (FOLVITE) tablet 1 mg  1 mg Oral Daily Chesley Mires, MD   1 mg at 04/09/20 0801  . heparin injection 1,000 Units  1,000 Units Dialysis PRN Dwana Melena, MD      . heparin injection 5,000 Units  5,000 Units Subcutaneous [address] [place], [place]   5,000 Units at 04/08/20 2203  . hydrALAZINE (APRESOLINE) injection 10 mg  10 mg Intravenous Q4H PRN Sheikh, Georgina Quint Latif, DO      . hydrOXYzine (ATARAX/VISTARIL) tablet 10 mg  10 mg Oral TID PRN Noemi Chapel P, DO   10 mg at 04/09/20 1047  . insulin aspart (novoLOG) injection 0-5 Units  0-5 Units Subcutaneous QHS Chesley Mires, MD   1 Units at 04/08/20 2203  . insulin aspart (novoLOG) injection 0-6 Units  0-6 Units Subcutaneous TID WC Chesley Mires, MD   0 Units at 04/08/20 1638  . ipratropium-albuterol (DUONEB) 0.5-2.5 (3) MG/3ML nebulizer solution 3 mL  3 mL Nebulization Q4H PRN Bowser, Laurel Dimmer, NP      . LORazepam (ATIVAN) injection 1-4 mg  1-4 mg Intravenous Q4H PRN Desai, Rahul P, PA-C   2 mg at 04/09/20 1044  . metoprolol tartrate (LOPRESSOR) injection 5 mg  5 mg Intravenous Q4H PRN Chesley Mires, MD      . multivitamin with minerals tablet 1 tablet  1 tablet Oral Daily Chesley Mires, MD   1 tablet at 04/09/20 0801  . ondansetron (ZOFRAN) injection 4 mg  4 mg Intravenous Q6H PRN Chotiner, Yevonne Aline, MD      . pantoprazole sodium (PROTONIX) 40 mg/20 mL oral suspension 40 mg  40 mg Oral Daily Chesley Mires, MD   40 mg at 04/09/20 0932  . pentafluoroprop-tetrafluoroeth (GEBAUERS) aerosol 1 application  1 application Topical PRN Valentina Gu, NP      . QUEtiapine (SEROQUEL) tablet 25 mg  25 mg Oral BID Noemi Chapel P, DO   25 mg at 04/09/20 0931  . sevelamer carbonate (RENVELA) tablet 800 mg  800 mg Oral TID WC Chesley Mires, MD   800 mg at 04/09/20  0801  . sodium chloride flush (NS) 0.9 % injection 3 mL  3 mL Intravenous Q12H Chotiner, Yevonne Aline, MD   3 mL at 04/08/20 2203  . sodium chloride flush (NS) 0.9 % injection 3 mL  3 mL Intravenous PRN Chotiner, Yevonne Aline, MD   3 mL at 04/07/20 2329  . thiamine tablet 100 mg  100 mg Oral Daily Chesley Mires, MD   100 mg at 04/09/20 0802    Musculoskeletal: Strength & Muscle Tone: Unable to assess Gait & Station: Unable to assess Patient leans: N/A  Psychiatric Specialty Exam: Physical Exam Vitals and nursing note reviewed.  Skin:    General: Skin is warm and dry.  Psychiatric:        Attention and Perception: He perceives auditory hallucinations.        Speech: Speech is delayed.        Thought Content: Thought content is not paranoid. Thought content does not include homicidal or suicidal ideation.     Comments: Unable to do full assessment related to patient in/out of sleep during assessment (drowsy).      Review of Systems  Unable to perform ROS: Acuity of condition  Psychiatric/Behavioral: Positive for hallucinations. Negative for self-injury and suicidal ideas.    Blood pressure 107/63, pulse 69, temperature 97.9 F (36.6 C), temperature source Axillary, resp. rate (!) 29, height 5\' 4"  (1.626 m), weight 96.2 kg, SpO2 (!) 87 %.Body mass index is 36.4 kg/m.  General Appearance: Hospital gown  Eye Contact:  Minimal related to being in/out sleep  Speech:  Garbled and Slow  Volume:  Normal  Mood:  Sleepy  Affect:  Congruent  Thought Process:  Disorganized and Linear  Orientation:  Other:  to person  Thought Content:  Hallucinations: Auditory  Suicidal Thoughts:  No  Homicidal Thoughts:  No  Memory:  Immediate;   Poor Recent;   Poor Remote;   Poor  Judgement:  Impaired  Insight:  Lacking  Psychomotor Activity:  Decreased  Concentration:  Concentration: Poor and Attention Span: Poor  Recall:  Poor  Fund of Knowledge:  Unable to assess  Language:  Unable to assess   Akathisia:  No  Handed:  Right  AIMS (if indicated):     Assets:  Housing Social Support  ADL's:  Unable to assess  Cognition:  Impaired,  Mild  Sleep:       Treatment Plan Summary: Medication management and Plan   Continue with Seroquel 25 mg Bid at this time.  May want to try Zyprexa zydis 2.5 mg Q hs if no improvement with Seroquel.  Patient psychiatrically cleared at this time but if needed in the future can reorder psychiatric consult.    Prior to discharge Social work consult  to set patient up with outpatient psychiatric services for therapy and medication management if needed.    Disposition:  Psychiatrically cleared No evidence of imminent risk to self or others at present.   Patient does not meet criteria for psychiatric inpatient admission. Supportive therapy provided about ongoing stressors. Discussed crisis plan, support from social network, calling 911, coming to the Emergency Department, and calling Suicide Hotline.   Spoke with Dr. Carlis Abbott who is aware of recommendations and disposition  Varun Jourdan, NP 04/09/2020 1:14 PM

## 2020-04-09 NOTE — Progress Notes (Addendum)
Patient pulled out his second IV. Not compliant with nursing staff, ripping off gown, nasal canula, and monitor wires. RN exhausted all kind of preventive measures. E-link notified.

## 2020-04-09 NOTE — Progress Notes (Signed)
Progress Note  Patient Name: Jordan [name] Date of Encounter: 04/09/2020  Primary Cardiologist: New to [facility]   Subjective   The patient denies chest pain or SOB.  Inpatient Medications    Scheduled Meds: . amiodarone  200 mg Oral BID  . carvedilol  3.125 mg Oral BID WC  . Chlorhexidine Gluconate Cloth  6 each Topical Q0600  . diphenhydrAMINE  25 mg Intravenous Once  . folic acid  1 mg Oral Daily  . heparin injection (subcutaneous)  5,000 Units Subcutaneous Q8H  . insulin aspart  0-5 Units Subcutaneous QHS  . insulin aspart  0-6 Units Subcutaneous TID WC  . multivitamin with minerals  1 tablet Oral Daily  . pantoprazole sodium  40 mg Oral Daily  . QUEtiapine  25 mg Oral BID  . sevelamer carbonate  800 mg Oral TID WC  . sodium chloride flush  3 mL Intravenous Q12H  . thiamine  100 mg Oral Daily   Continuous Infusions: . sodium chloride    . sodium chloride    . sodium chloride    . sodium chloride    . sodium chloride    . amiodarone Stopped (04/08/20 0933)  . dexmedetomidine (PRECEDEX) IV infusion Stopped (04/09/20 0424)   PRN Meds: sodium chloride, sodium chloride, sodium chloride, sodium chloride, sodium chloride, acetaminophen, camphor-menthol, diphenhydrAMINE, heparin, hydrALAZINE, hydrOXYzine, ipratropium-albuterol, LORazepam, metoprolol tartrate, ondansetron (ZOFRAN) IV, pentafluoroprop-tetrafluoroeth, sodium chloride flush   Vital Signs    Vitals:   04/09/20 0745 04/09/20 0758 04/09/20 0800 04/09/20 0900  BP: (!) 164/76  122/70   Pulse: 92 82 80 78  Resp:   19 (!) 25  Temp:      TempSrc:      SpO2: 95% 90% 90% 96%  Weight:      Height:        Intake/Output Summary (Last 24 hours) at 04/09/2020 0948 Last data filed at 04/09/2020 0800 Gross per 24 hour  Intake 915.8 ml  Output --  Net 915.8 ml   Filed Weights   04/07/20 1726 04/08/20 0437 04/09/20 0321  Weight: 67.6 kg 96.5 kg 96.2 kg    Physical Exam   General: Ill appearing, on [place]  oxygen Neck: Negative for carotid bruits. No JVD Lungs:Clear to ausculation bilaterally.  Cardiovascular: RRR with S1 S2. No murmurs Abdomen: Soft, non-tender, non-distended. No obvious abdominal masses. Extremities: No edema. Radial pulses 2+ bilaterally Neuro: Intubated not responsive   Labs    Chemistry Recent Labs  Lab 04/05/20 0500 04/05/20 0500 04/06/20 0506 04/06/20 0506 04/07/20 5638 04/07/20 7564 04/07/20 1159 04/07/20 1159 04/07/20 2054 04/08/20 0350 04/09/20 0258  NA 141   < > 139   < > 139   < > 138   < > 139 136 138  K 4.0   < > 3.4*   < > 4.6   < > 4.0   < > 3.8 3.7 3.7  CL 98   < > 97*   < > 97*   < > 97*  --   --  97* 97*  CO2 27   < > 26   < > 29   < > 25  --   --  27 27  GLUCOSE 82   < > 118*   < > 182*   < > 114*  --   --  77 108*  BUN 59*   < > 40*   < > 32*   < > 35*  --   --  20 40*  CREATININE 9.01*   < > 6.97*   < > 6.50*   < > 6.57*  --   --  4.63* 6.77*  CALCIUM 9.4   < > 9.2   < > 9.6   < > 8.9  --   --  9.3 9.1  PROT 7.1  --  7.3  --  7.8  --   --   --   --   --   --   ALBUMIN 3.2*   < > 3.4*   < > 3.5  --  2.9*  --   --   --  3.1*  AST 16  --  22  --  20  --   --   --   --   --   --   ALT 16  --  18  --  18  --   --   --   --   --   --   ALKPHOS 102  --  106  --  128*  --   --   --   --   --   --   BILITOT 1.9*  --  1.6*  --  1.3*  --   --   --   --   --   --   GFRNONAA 6*   < > 8*   < > 9*   < > 9*  --   --  14* 9*  GFRAA 7*   < > 10*   < > 10*   < > 10*  --   --  16* 10*  ANIONGAP 16*   < > 16*   < > 13   < > 16*  --   --  12 14   < > = values in this interval not displayed.     Hematology Recent Labs  Lab 04/06/20 0506 04/06/20 0506 04/07/20 0838 04/07/20 2054 04/09/20 0258  WBC 8.6  --  10.4  --  8.5  RBC 2.60*  --  2.79*  --  2.41*  HGB 8.5*   < > 8.8* 9.2* 7.7*  HCT 27.3*   < > 30.4* 27.0* 24.5*  MCV 105.0*  --  109.0*  --  101.7*  MCH 32.7  --  31.5  --  32.0  MCHC 31.1  --  28.9*  --  31.4  RDW 19.7*  --  19.2*  --   18.5*  PLT 146*  --  188  --  169   < > = values in this interval not displayed.    Cardiac EnzymesNo results for input(s): TROPONINI in the last 168 hours. No results for input(s): TROPIPOC in the last 168 hours.   BNPNo results for input(s): BNP, PROBNP in the last 168 hours.   DDimer No results for input(s): DDIMER in the last 168 hours.   Radiology    DG CHEST PORT 1 VIEW  Result Date: 04/07/2020 CLINICAL DATA:  Shortness of breath. EXAM: PORTABLE CHEST 1 VIEW COMPARISON:  Same day. FINDINGS: Stable cardiomegaly. Endotracheal and nasogastric tubes appear to be in grossly good position. No pneumothorax pleural effusion is noted. Hypoinflation of the lungs is noted with mild bibasilar subsegmental atelectasis. Bony thorax is unremarkable. IMPRESSION: Hypoinflation of the lungs with mild bibasilar subsegmental atelectasis. Aortic Atherosclerosis (ICD10-I70.0). Electronically Signed   By: Marijo Conception M.D.   On: 04/07/2020 10:32   Telemetry    NSR currently. Was in Afib intermittently during the  night, the last two episodes at 8 pm and 11 pm.  - Personally Reviewed  ECG    No new tracing as of 04/06/20- Personally Reviewed  Cardiac Studies   Echocardiogram 04/05/2020:    1. Limited echo to assess for pericardial effusion and LV systolic  function   2. Difficult to assess LVEF given patient was in Afib with RVR during  study, but appears mildly reduced. Left ventricular ejection fraction, by  estimation, is 45 to 50%. The left ventricle has mildly decreased  function. The left ventricle demonstrates  global hypokinesis. There is moderate left ventricular hypertrophy.   3. Right ventricular systolic function is mildly reduced. The right  ventricular size is moderately enlarged. There is moderately elevated  pulmonary artery systolic pressure. The estimated right ventricular  systolic pressure is 01.0 mmHg.   4. Small to moderate pericardial effusion measuring up to 1 cm  adjacent  to LV lateral wall. Unable to assess for mitral inflow respiratory  variation given Afib. IVC is fixed/dilated, but no RA/RV collapse is seen  to suggest tamponade.   5. The inferior vena cava is dilated in size with <50% respiratory  variability, suggesting right atrial pressure of 15 mmHg.   6. Mitral regurgitation was not well evaluated on current study, but  noted to have eccentric MR on prior echo that by quantification was severe  (ERO 0.46 cm^2, RV 73cc). Consider TEE or cardiac MRI for further  evaluation      Echocardiogram 01/24/2020:   1. Left ventricular ejection fraction, by estimation, is 45 to 50%. The  left ventricle has mildly decreased function. The left ventricle  demonstrates global hypokinesis. There is moderate left ventricular  hypertrophy. Left ventricular diastolic  parameters are consistent with Grade III diastolic dysfunction  (restrictive). Elevated left ventricular end-diastolic pressure.   2. Right ventricular systolic function is normal. The right ventricular  size is normal. There is severely elevated pulmonary artery systolic  pressure.   3. Left atrial size was severely dilated.   4. Right atrial size was severely dilated.   5. The mitral valve is grossly normal. Mild mitral valve regurgitation.  No evidence of mitral stenosis.   6. Tricuspid valve regurgitation is moderate.   7. The aortic valve is tricuspid. Aortic valve regurgitation is not  visualized. Mild aortic valve sclerosis is present, with no evidence of  aortic valve stenosis.   8. The inferior vena cava is dilated in size with <50% respiratory  variability, suggesting right atrial pressure of 15 mmHg.   Comparison(s): No prior Echocardiogram.    Patient Profile     54 y.o. male with a hx of ESRD noncompliant with HD, anemia of chronic kidney disease, recurrent hyperkalemia, secondary hyperparathyroidism, history of EtOH and tobacco abuse who is being seen today for the  evaluation of new onset atrial fibrillation at the request of Dr. Alfredia Ferguson.  Assessment & Plan    1. New onset AF with RVR: -Started on low-dose Eliquis per primary team however suspect is not a candidate for chronic long-term anticoagulation given history of poor compliance and hemodialysis -Plan to repeat echocardiogram to rule out pericardial effusion as outpatient>>EF noted to be 45-50% with mildly decreased diastolic function, global hypokinesis, moderately elevated PA pressure at 67mmHg with small to moderate pericardial effusion with no RA/RV collapse to indicate cardiac tamponade. Mitral regurgitation was not well evaluated on current study. Reevaluate with outpatient Echo.  -Patient loaded with IV amiodarone infusion to help maintain NSR. Was switched to  po amiodarone but then IV resumed with respiratory arrest on 7/17, now back on amiodarone 200 mg PO BID with episodes of a-fib, the last one at 11 pm yesterday. Still having some intermittent AFib but rate OK.  -HR improved with  low dose BB, I would increase carvedilol to 6.25 mg PO BID   2. Acute respiratory failure: respiratory arrest this am. -now self extubated. Suspect this is at least in part to respiratory depression from CIWA protocol. Plans per CCM. On [place] oxygen now.   3.  Acute on chronic combined systolic and diastolic CHF: -Prior echocardiogram from May 2021 with LVEF of 45 to 50%>> cardiology service not consulted at that time -Given history of noncompliance, likely poor candidate for further invasive study -Fuid volume managed with HD    4.  Hyperkalemia/hypokalemia: -Replacement with HD   5.  Anemia of chronic disease: -hemoglobin, 6.7->9.2->7.7  6. ESRD on HD. Per Nephrology   Signed, Ena Dawley, MD 04/09/2020, 9:48 AM     For questions or updates, please contact   Please consult [email or web address] for contact info under Cardiology/STEMI.

## 2020-04-09 NOTE — Progress Notes (Addendum)
1:30pm: CSW received return call from Crowder at [place] who states this patient's residence is under the [place] instead.  CSW spoke with Network engineer at [place] who states this patient is not on section 8 housing per her database search.  12pm: CSW received notification from [facility] secretary regarding this patient's need for assistance with paying his rent.   CSW met with patient at bedside to complete discussion - CSW able to obtain permission from patient to contact the [facility] on his behalf to discuss his missed rent payment.  CSW attempted to reach staff at the [employer] without success - voicemail was left requesting a return call.  Madilyn Fireman, MSW, LCSW-A Transitions of Care  Clinical Social Worker  [facility] Emergency Departments  Medical ICU [phone]

## 2020-04-09 NOTE — Progress Notes (Signed)
  OT Cancellation Note  Patient Details Name: Jordan [name] MRN: [id number] DOB: Sep 23, 1965   Cancelled Treatment:    Reason Eval/Treat Not Completed: Patient at procedure or test/ unavailable Pt currently receiving HD. OT will return later as time allows and pt is appropriate.   [facility] OTR/L Acute Rehabilitation Services Office: [place] 04/09/2020, 3:28 PM

## 2020-04-09 NOTE — Progress Notes (Signed)
[facility] Progress Note Patient Name: Jordan [name] DOB: 01/22/1966 MRN: [id number]   Date of Service  04/09/2020  HPI/Events of Note  Patient now agitated stating that he has bumps on his legs because of the bed and would want to go home. Already given PO benadryl 25 earlier  eICU Interventions  Will give another Benadryl 25 IV this time. Will inform bedside ccm team as per bedside RN request     Intervention Category Minor Interventions: Agitation / anxiety - evaluation and management  Judd Lien 04/09/2020, 1:20 AM

## 2020-04-09 NOTE — Progress Notes (Signed)
NAME:  Jordan [name], MRN:  [id number], DOB:  May 18, 1966, LOS: 5 ADMISSION DATE:  04/03/2020, CONSULTATION DATE:  04/07/2020 REFERRING MD:  Dr. Chana [name], [facility], CHIEF COMPLAINT:  AMS   Brief History   54 yo male smoker with hx of ESRD on iHD at welfare check at home after missing HD for 2 weeks.  Found to have hypoxia from acute pulmonary edema, hyperkalemia and a fib with RVR.  Seen by nephrology and cardiology.  Developed agitation and confusion, and started on CIWA protocol.  Developed altered mental status and respiratory arrest requiring intubation 7/17 and transferred to ICU.  Past Medical History  ESRD, systolic/diastolic CHF, HTN, ETOH, Emphysema, ED  Significant Hospital Events   7/13 admit, iHD, Bipap 7/14 amiodarone gtt started, transfuse PRBC 7/15 CIWA protocol started 7/16 more confused/agitated 7/17 respiratory arrest/VDRF, transfer to ICU; self extubated himself 7/18 started on precedex  Consults:  Nephrology Cardiology  Procedures:  ETT 7/17 >> 7/17  Significant Diagnostic Tests:  Echo 7/15 >> EF 45 to 50%, mod LVH, moderately enlarged RV, PASP 53.7, small/mod pericardial effusion  Micro Data:  COVID 7/13 >> negative  MRSA PCR 7/17 >>   Antimicrobials:    Interim history/subjective:  Agitated overnight, wanted to leave AMA. Pulled out all IVs.  Objective   Blood pressure (!) 164/76, pulse 82, temperature 98.8 F (37.1 C), temperature source Oral, resp. rate (!) 22, height 5\' 4"  (1.626 m), weight 96.2 kg, SpO2 90 %.        Intake/Output Summary (Last 24 hours) at 04/09/2020 0834 Last data filed at 04/09/2020 0300 Gross per 24 hour  Intake 692.14 ml  Output --  Net 692.14 ml   Filed Weights   04/07/20 1726 04/08/20 0437 04/09/20 0321  Weight: 67.6 kg 96.5 kg 96.2 kg    Examination:  General - chronically ill appearing man laying in bed in NAD Eyes - L eyelid swollen, eyes anicteric HENT - [place]/ oral mucosa moist, edentulous  Cardiac - muffled  heart sounds, regular rate & rhythm, NSR on tele Chest - breathing comfortably on 2.5L O2, CTAB Abdomen - soft, NT Extremities - no peripheral edema, no cyanosis Skin - no rashes Neuro - RASS +1, moving around in the bed on his own, moving all extremities Psych: Oriented to person, not to time or date. Told me the name of his daughter incorrectly. Agitated. Not able to describe the potential consequences of missing dialysis today as he is requesting.  Resolved Hospital Problem list   Hyperkalemia, thrombocytopenia, Acute hypoxic respiratory failure with compromised airway from acute pulmonary edema  Assessment & Plan:   Acute metabolic encephalopathy 2nd to hypoxia, uremia. ETOH withdrawal. Does not have capacity to refuse care/ make his own decisions at this point- unclear chronicity. -start seroquel 25mg  PO BID -may need to restart precedex with RASS goal 0 to -1 - con't thiamine, folic acid  New onset A- fib with RVR. Acute on chronic combined CHF Pericardial effusion likely in setting of uremia Hx of HTN - con't amiodarone and coreg; appreciate cardiology's assistance - agree that he is most likely not a suitable candidate for long term anticoagulation given history of non compliance  ESRD Metabolic bone disease - iHD per renal- planning for today -renal diet -renally dose meds  Acute respiratory failure with hypoxia, likely acute pulmonary edema from hypervolemia Emphysema, tobacco abuse. - prn BDs -wean O2 as able  Anemia of critical illness and chronic disease. - f/u CBC intermittently - transfuse of Hb <  7 or significant bleeding  Hyperglycemia- controlled - SSI PRN -goal BG 140-180 while admitted   Jordan [name] Does not have capacity to make decisions at this time -Daughter Jordan [name] and her mother Jordan [name] (who the patient had incorrectly said was his daughter) are concerned about him because he repetitively leaves AMA and does not care for himself. Jordan [name] said "he  isn't going to make it" if he leaves again. Recently had a brother that passed away and she is concerned that he is depressed and that contributes to his decisions. They think he needs help from a Psychiatrist.  Best practice:  Diet: renal diet DVT prophylaxis: SQ heparin GI prophylaxis: protonix Mobility: bed rest Code Status: full code Disposition: ICU Family: updated daughter and her mother Jordan [name] by phone  Labs    CMP Latest Ref Rng & Units 04/09/2020 04/08/2020 04/07/2020  Glucose 70 - 99 mg/dL 108(H) 77 -  BUN 6 - 20 mg/dL 40(H) 20 -  Creatinine 0.61 - 1.24 mg/dL 6.77(H) 4.63(H) -  Sodium 135 - 145 mmol/L 138 136 139  Potassium 3.5 - 5.1 mmol/L 3.7 3.7 3.8  Chloride 98 - 111 mmol/L 97(L) 97(L) -  CO2 22 - 32 mmol/L 27 27 -  Calcium 8.9 - 10.3 mg/dL 9.1 9.3 -  Total Protein 6.5 - 8.1 g/dL - - -  Total Bilirubin 0.3 - 1.2 mg/dL - - -  Alkaline Phos 38 - 126 U/L - - -  AST 15 - 41 U/L - - -  ALT 0 - 44 U/L - - -    CBC Latest Ref Rng & Units 04/09/2020 04/07/2020 04/07/2020  WBC 4.0 - 10.5 K/uL 8.5 - 10.4  Hemoglobin 13.0 - 17.0 g/dL 7.7(L) 9.2(L) 8.8(L)  Hematocrit 39 - 52 % 24.5(L) 27.0(L) 30.4(L)  Platelets 150 - 400 K/uL 169 - 188    ABG    Component Value Date/Time   PHART 7.321 (L) 04/07/2020 2054   PCO2ART 64.8 (H) 04/07/2020 2054   PO2ART 104 04/07/2020 2054   HCO3 33.5 (H) 04/07/2020 2054   TCO2 35 (H) 04/07/2020 2054   ACIDBASEDEF 0.3 04/05/2020 1435   O2SAT 97.0 04/07/2020 2054    CBG (last 3)  Recent Labs    04/08/20 1527 04/08/20 1936 04/09/20 0404  GLUCAP 126* 147* 100*     This patient is critically ill with multiple organ system failure which requires frequent high complexity decision making, assessment, support, evaluation, and titration of therapies. This was completed through the application of advanced monitoring technologies and extensive interpretation of multiple databases. During this encounter critical care time was devoted to patient  care services described in this note for 33 minutes.  Jordan Hy, DO 04/09/20 9:21 AM [facility]

## 2020-04-09 NOTE — Progress Notes (Addendum)
Pt attempting to get out of bed and leave. Dr. Carlis Abbott has deemed patient unable to make decisions to leave due to confusion/delirium and pt needs hemodialysis. Assistance called to bedside to contain situation. Charge RN, Gladys Damme and NP, Marni Griffon in room at bedside. Patient punching bed and threatening to hit. 2 mg IM Haldol given with security at bedside. IV team arrives to obtain IV and 2 mg IV Ativan given and Precedex started. PIV wrapped in kerlex for protection. Will continue to monitor. Patient seems at ease at the moment. No restraints at this time.   Dewaine Oats, RN

## 2020-04-09 NOTE — Progress Notes (Signed)
PCCM Interval Progress Note  Called to bedside due to pt agitation.  Threatening to leave AMA because he feels that the bed is giving him bumps and "tearing him up".  Was on precedex earlier during day shift.  Currently off.  He is refusing nurse to give him any more meds or assess him.  He is yelling at staff.  I re-directed him and encouraged him to stay through the morning when we can re-evaluate things.  In the meantime, will resume precedex and start PRN ativan.   Montey Hora, [place] [facility] 04/09/2020, 1:42 AM

## 2020-04-09 NOTE — Progress Notes (Signed)
Patient getting agitated, raising his voice and getting more frustrated. He is also complaining that "something is biting his legs." He is refusing all care and wants to call his brother to leave hospital. E-link notified.

## 2020-04-10 DIAGNOSIS — F23 Brief psychotic disorder: Secondary | ICD-10-CM

## 2020-04-10 DIAGNOSIS — Z992 Dependence on renal dialysis: Secondary | ICD-10-CM

## 2020-04-10 LAB — RENAL FUNCTION PANEL
Albumin: 3.2 g/dL — ABNORMAL LOW (ref 3.5–5.0)
Anion gap: 10 (ref 5–15)
BUN: 27 mg/dL — ABNORMAL HIGH (ref 6–20)
CO2: 28 mmol/L (ref 22–32)
Calcium: 9.1 mg/dL (ref 8.9–10.3)
Chloride: 100 mmol/L (ref 98–111)
Creatinine, Ser: 5.55 mg/dL — ABNORMAL HIGH (ref 0.61–1.24)
GFR calc Af Amer: 13 mL/min — ABNORMAL LOW (ref >60)
GFR calc non Af Amer: 11 mL/min — ABNORMAL LOW (ref >60)
Glucose, Bld: 89 mg/dL (ref 70–99)
Phosphorus: 4.2 mg/dL (ref 2.5–4.6)
Potassium: 4.8 mmol/L (ref 3.5–5.1)
Sodium: 138 mmol/L (ref 135–145)

## 2020-04-10 LAB — CBC
HCT: 26.1 % — ABNORMAL LOW (ref 39.0–52.0)
Hemoglobin: 7.7 g/dL — ABNORMAL LOW (ref 13.0–17.0)
MCH: 31.4 pg (ref 26.0–34.0)
MCHC: 29.5 g/dL — ABNORMAL LOW (ref 30.0–36.0)
MCV: 106.5 fL — ABNORMAL HIGH (ref 80.0–100.0)
Platelets: 153 10*3/uL (ref 150–400)
RBC: 2.45 MIL/uL — ABNORMAL LOW (ref 4.22–5.81)
RDW: 18.5 % — ABNORMAL HIGH (ref 11.5–15.5)
WBC: 7.6 10*3/uL (ref 4.0–10.5)
nRBC: 0 % (ref 0.0–0.2)

## 2020-04-10 LAB — GLUCOSE, CAPILLARY
Glucose-Capillary: 85 mg/dL (ref 70–99)
Glucose-Capillary: 69 mg/dL — ABNORMAL LOW (ref 70–99)
Glucose-Capillary: 83 mg/dL (ref 70–99)
Glucose-Capillary: 71 mg/dL (ref 70–99)
Glucose-Capillary: 128 mg/dL — ABNORMAL HIGH (ref 70–99)
Glucose-Capillary: 87 mg/dL (ref 70–99)

## 2020-04-10 MED ORDER — OLANZAPINE 2.5 MG PO TABS
2.5000 mg | ORAL_TABLET | Freq: Every day | ORAL | Status: DC
  Filled 2020-04-10: qty 1

## 2020-04-10 MED ORDER — OLANZAPINE 2.5 MG PO TABS: 2.5000 mg | ORAL_TABLET | Freq: Every day | ORAL | 1 refills | Status: DC

## 2020-04-10 MED ORDER — CHLORHEXIDINE GLUCONATE CLOTH 2 % EX PADS: 6.0000 | MEDICATED_PAD | Freq: Every day | CUTANEOUS | Status: DC

## 2020-04-10 MED ORDER — CARVEDILOL 3.125 MG PO TABS
3.1250 mg | ORAL_TABLET | Freq: Two times a day (BID) | ORAL | Status: DC
  Administered 2020-04-10: 3.125 mg via ORAL
  Filled 2020-04-10 (×2): qty 1

## 2020-04-10 MED ORDER — DARBEPOETIN ALFA 150 MCG/0.3ML IJ SOSY: 150.0000 ug | PREFILLED_SYRINGE | INTRAMUSCULAR | Status: DC

## 2020-04-10 NOTE — Progress Notes (Signed)
Progress Note  Patient Name: Jordan [name] Date of Encounter: 04/10/2020  Primary Cardiologist: New to [facility]   Subjective   The patient appears somnolent but denies any symptoms.  Inpatient Medications    Scheduled Meds: . amiodarone  200 mg Oral BID  . carvedilol  6.25 mg Oral BID WC  . Chlorhexidine Gluconate Cloth  6 each Topical Q0600  . [START ON 04/11/2020] darbepoetin (ARANESP) injection - DIALYSIS  150 mcg Intravenous Q Wed-HD  . folic acid  1 mg Oral Daily  . heparin injection (subcutaneous)  5,000 Units Subcutaneous Q8H  . insulin aspart  0-5 Units Subcutaneous QHS  . insulin aspart  0-6 Units Subcutaneous TID WC  . multivitamin with minerals  1 tablet Oral Daily  . OLANZapine zydis  2.5 mg Oral QHS  . QUEtiapine  25 mg Oral BID  . sevelamer carbonate  800 mg Oral TID WC  . sodium chloride flush  3 mL Intravenous Q12H  . thiamine  100 mg Oral Daily   Continuous Infusions: . sodium chloride    . sodium chloride    . sodium chloride    . sodium chloride    . sodium chloride    . dexmedetomidine (PRECEDEX) IV infusion 1 mcg/kg/hr (04/10/20 0700)   PRN Meds: sodium chloride, sodium chloride, sodium chloride, sodium chloride, sodium chloride, acetaminophen, camphor-menthol, diphenhydrAMINE, diphenhydrAMINE, heparin, hydrALAZINE, hydrOXYzine, ipratropium-albuterol, LORazepam, metoprolol tartrate, ondansetron (ZOFRAN) IV, pentafluoroprop-tetrafluoroeth, sodium chloride flush   Vital Signs    Vitals:   04/10/20 0407 04/10/20 0500 04/10/20 0600 04/10/20 0700  BP: (!) 97/55 (!) 93/57 96/66   Pulse: (!) 57 (!) 57 (!) 57   Resp: 17 (!) 25 (!) 28   Temp:    97.8 F (36.6 C)  TempSrc:    Axillary  SpO2: 91% 100% (!) 86%   Weight:      Height:        Intake/Output Summary (Last 24 hours) at 04/10/2020 0847 Last data filed at 04/10/2020 0700 Gross per 24 hour  Intake 396.51 ml  Output -490 ml  Net 886.51 ml   Filed Weights   04/07/20 1726 04/08/20 0437  04/09/20 0321  Weight: 67.6 kg 96.5 kg 96.2 kg    Physical Exam   General: Ill appearing, on [place] oxygen Neck: Negative for carotid bruits. No JVD Lungs:Clear to ausculation bilaterally.  Cardiovascular: RRR with S1 S2. No murmurs Abdomen: Soft, non-tender, non-distended. No obvious abdominal masses. Extremities: No edema. Radial pulses 2+ bilaterally Neuro: Intubated not responsive   Labs    Chemistry Recent Labs  Lab 04/05/20 0500 04/05/20 0500 04/06/20 0506 04/06/20 0506 04/07/20 3154 04/07/20 0086 04/07/20 1159 04/07/20 1159 04/07/20 2054 04/08/20 0350 04/09/20 0258  NA 141   < > 139   < > 139   < > 138   < > 139 136 138  K 4.0   < > 3.4*   < > 4.6   < > 4.0   < > 3.8 3.7 3.7  CL 98   < > 97*   < > 97*   < > 97*  --   --  97* 97*  CO2 27   < > 26   < > 29   < > 25  --   --  27 27  GLUCOSE 82   < > 118*   < > 182*   < > 114*  --   --  77 108*  BUN 59*   < >  40*   < > 32*   < > 35*  --   --  20 40*  CREATININE 9.01*   < > 6.97*   < > 6.50*   < > 6.57*  --   --  4.63* 6.77*  CALCIUM 9.4   < > 9.2   < > 9.6   < > 8.9  --   --  9.3 9.1  PROT 7.1  --  7.3  --  7.8  --   --   --   --   --   --   ALBUMIN 3.2*   < > 3.4*   < > 3.5  --  2.9*  --   --   --  3.1*  AST 16  --  22  --  20  --   --   --   --   --   --   ALT 16  --  18  --  18  --   --   --   --   --   --   ALKPHOS 102  --  106  --  128*  --   --   --   --   --   --   BILITOT 1.9*  --  1.6*  --  1.3*  --   --   --   --   --   --   GFRNONAA 6*   < > 8*   < > 9*   < > 9*  --   --  14* 9*  GFRAA 7*   < > 10*   < > 10*   < > 10*  --   --  16* 10*  ANIONGAP 16*   < > 16*   < > 13   < > 16*  --   --  12 14   < > = values in this interval not displayed.     Hematology Recent Labs  Lab 04/06/20 0506 04/06/20 0506 04/07/20 0838 04/07/20 2054 04/09/20 0258  WBC 8.6  --  10.4  --  8.5  RBC 2.60*  --  2.79*  --  2.41*  HGB 8.5*   < > 8.8* 9.2* 7.7*  HCT 27.3*   < > 30.4* 27.0* 24.5*  MCV 105.0*  --  109.0*  --   101.7*  MCH 32.7  --  31.5  --  32.0  MCHC 31.1  --  28.9*  --  31.4  RDW 19.7*  --  19.2*  --  18.5*  PLT 146*  --  188  --  169   < > = values in this interval not displayed.    Cardiac EnzymesNo results for input(s): TROPONINI in the last 168 hours. No results for input(s): TROPIPOC in the last 168 hours.   BNPNo results for input(s): BNP, PROBNP in the last 168 hours.   DDimer No results for input(s): DDIMER in the last 168 hours.   Radiology    No results found. Telemetry    NSR currently.  No episodes of atrial fibrillation in the last 24 hours.- Personally Reviewed  ECG    No new tracing as of 04/06/20- Personally Reviewed  Cardiac Studies   Echocardiogram 04/05/2020:    1. Limited echo to assess for pericardial effusion and LV systolic  function   2. Difficult to assess LVEF given patient was in Afib with RVR during  study, but appears mildly reduced. Left ventricular ejection fraction, by  estimation, is 45 to 50%. The left ventricle has mildly decreased  function. The left ventricle demonstrates  global hypokinesis. There is moderate left ventricular hypertrophy.   3. Right ventricular systolic function is mildly reduced. The right  ventricular size is moderately enlarged. There is moderately elevated  pulmonary artery systolic pressure. The estimated right ventricular  systolic pressure is 86.7 mmHg.   4. Small to moderate pericardial effusion measuring up to 1 cm adjacent  to LV lateral wall. Unable to assess for mitral inflow respiratory  variation given Afib. IVC is fixed/dilated, but no RA/RV collapse is seen  to suggest tamponade.   5. The inferior vena cava is dilated in size with <50% respiratory  variability, suggesting right atrial pressure of 15 mmHg.   6. Mitral regurgitation was not well evaluated on current study, but  noted to have eccentric MR on prior echo that by quantification was severe  (ERO 0.46 cm^2, RV 73cc). Consider TEE or cardiac MRI  for further  evaluation      Echocardiogram 01/24/2020:   1. Left ventricular ejection fraction, by estimation, is 45 to 50%. The  left ventricle has mildly decreased function. The left ventricle  demonstrates global hypokinesis. There is moderate left ventricular  hypertrophy. Left ventricular diastolic  parameters are consistent with Grade III diastolic dysfunction  (restrictive). Elevated left ventricular end-diastolic pressure.   2. Right ventricular systolic function is normal. The right ventricular  size is normal. There is severely elevated pulmonary artery systolic  pressure.   3. Left atrial size was severely dilated.   4. Right atrial size was severely dilated.   5. The mitral valve is grossly normal. Mild mitral valve regurgitation.  No evidence of mitral stenosis.   6. Tricuspid valve regurgitation is moderate.   7. The aortic valve is tricuspid. Aortic valve regurgitation is not  visualized. Mild aortic valve sclerosis is present, with no evidence of  aortic valve stenosis.   8. The inferior vena cava is dilated in size with <50% respiratory  variability, suggesting right atrial pressure of 15 mmHg.   Comparison(s): No prior Echocardiogram.    Patient Profile     54 y.o. male with a hx of ESRD noncompliant with HD, anemia of chronic kidney disease, recurrent hyperkalemia, secondary hyperparathyroidism, history of EtOH and tobacco abuse who is being seen today for the evaluation of new onset atrial fibrillation at the request of Dr. Alfredia Ferguson.  Assessment & Plan    1. New onset AF with RVR: -Started on low-dose Eliquis per primary team however suspect is not a candidate for chronic long-term anticoagulation given history of poor compliance and hemodialysis, his hemoglobin is stable at 7.7 but down from 9.2 on 04/07/20 -Plan to repeat echocardiogram to rule out pericardial effusion as outpatient>>EF noted to be 45-50% with mildly decreased diastolic function, global  hypokinesis, moderately elevated PA pressure at 57mmHg with small to moderate pericardial effusion with no RA/RV collapse to indicate cardiac tamponade. Mitral regurgitation was not well evaluated on current study. Reevaluate with outpatient Echo.  -Patient loaded with IV amiodarone infusion to help maintain NSR. Was switched to po amiodarone but then IV resumed with respiratory arrest on 7/17, now back on amiodarone 200 mg PO BID with episodes of a-fib, the patient has not had episode of atrial fibrillation since denied of July 18 at 11 PM.  He is rather bradycardic, I will decrease carvedilol dose to 3.125 mg p.o. twice daily, I would continue amiodarone 200 mg p.o. twice daily for  next 3 days and then switch to 200 mg daily.    2. Acute respiratory failure: respiratory arrest this am. -now self extubated. Suspect this is at least in part to respiratory depression from CIWA protocol. Plans per CCM. On [place] oxygen now.   3.  Acute on chronic combined systolic and diastolic CHF: -Prior echocardiogram from May 2021 with LVEF of 45 to 50%>> cardiology service not consulted at that time -Given history of noncompliance, likely poor candidate for further invasive study -Fuid volume managed with HD    4.  Hyperkalemia/hypokalemia: -Replacement with HD   5.  Anemia of chronic disease: -hemoglobin, 6.7->9.2->7.7  6. ESRD on HD. Per Nephrology  We will sign off, please call us with any questions.  Signed, Ena Dawley, MD 04/10/2020, 8:47 AM     For questions or updates, please contact   Please consult [email or web address] for contact info under Cardiology/STEMI.

## 2020-04-10 NOTE — Progress Notes (Addendum)
04/10/2020  Called to patient's room.  He wants to leave against medical advice.  He is oriented to person, place, and time.  I ask what will happen if he keeps missing dialysis, he states "I will die."  I tell him I would want him to stay one more day to make sure he is medically stable and off oxygen, he is not willing to do that.  He is displaying capacity to make decision for discharge even though it shows poor judgement.  He states he will go to HD on his own tomorrow.  I will prescribe medicine for his mood and told him he needs to take it in addition to his phosphate binders.  We will see if he has transport home, if he does, he will be discharged.  Erskine Emery MD PCCM

## 2020-04-10 NOTE — Progress Notes (Signed)
AMA paperwork signed, pt brother coming to get him, pt missing a pair of boots, [facility] called to see if they were there but they did not have them.

## 2020-04-10 NOTE — Progress Notes (Addendum)
Physical Therapy Treatment Patient Details Name: Jordan [name] MRN: [id number] DOB: 17-Mar-1966 Today's Date: 04/10/2020    History of Present Illness 54 y.o. male with a hx of ESRD noncompliant with HD,anemia of chronic kidney disease, recurrent hyperkalemia, secondary hyperparathyroidism, history of EtOH and tobacco abusewho is being seen today for the evaluation of new onset atrial fibrillationat the request of Dr. Alfredia Ferguson.    PT Comments    Generally alert, improving mentation, but likely not near baseline.  Emphasis on transitions, sit to stand, balance, standing activity and progressing gait.  If pt's mentation continues to improve, he is now will be safe to do therapy with 1 person assist.    Follow Up Recommendations  SNF;Supervision/Assistance - 24 hour (working toward [place])     Equipment Recommendations  Other (comment) (TBA)    Recommendations for Other Services       Precautions / Restrictions Precautions Precautions: Fall Restrictions Weight Bearing Restrictions: No    Mobility  Bed Mobility Overal bed mobility: Needs Assistance Bed Mobility: Supine to Sit     Supine to sit: Min guard     General bed mobility comments: minguard for safety  Transfers Overall transfer level: Needs assistance Equipment used: 2 person hand held assist Transfers: Sit to/from Stand Sit to Stand: Min guard         General transfer comment: guard for safety/stability  Ambulation/Gait Ambulation/Gait assistance: Min assist Gait Distance (Feet): 200 Feet Assistive device: None Gait Pattern/deviations: Step-through pattern   Gait velocity interpretation: 1.31 - 2.62 ft/sec, indicative of limited community ambulator General Gait Details: generally unsteady with scissoring, drifting, staggering with scanning.  Able to increase cadence to an insignificant degree.   Stairs             Wheelchair Mobility    Modified Rankin (Stroke Patients Only)        Balance Overall balance assessment: Needs assistance Sitting-balance support: No upper extremity supported;Feet supported Sitting balance-Leahy Scale: Fair Sitting balance - Comments: donned socks while sitting EOB   Standing balance support: No upper extremity supported;During functional activity Standing balance-Leahy Scale: Fair Standing balance comment: based on a standing activity at the sink                            Cognition Arousal/Alertness: Awake/alert Behavior During Therapy: WFL for tasks assessed/performed;Flat affect Overall Cognitive Status: Impaired/Different from baseline Area of Impairment: Attention;Safety/judgement;Awareness;Problem solving                   Current Attention Level: Selective     Safety/Judgement: Decreased awareness of safety;Decreased awareness of deficits Awareness: Emergent;Intellectual Problem Solving: Slow processing General Comments: pt requiring cues for safety, pt with instability during functional mobility required assistance from therapists to correct;pt required increased time for processing      Exercises      General Comments General comments (skin integrity, edema, etc.): SpO2 >94% with good wave form, poor wave form during functional mobility, pt DoE 3/4 at end of session following toileting      Pertinent Vitals/Pain Pain Assessment: No/denies pain    Home Living Family/patient expects to be discharged to:: Private residence Living Arrangements: Alone Available Help at Discharge: Family;Available PRN/intermittently Type of Home: House Home Access: Stairs to enter   Home Layout: One level Home Equipment: Shower seat      Prior Function Level of Independence: Independent      Comments: (P) 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   PT Goals (current goals can now be found in the care plan section) Acute Rehab PT Goals Patient Stated Goal: unable to state PT Goal  Formulation: With patient Time For Goal Achievement: 04/10/20 Potential to Achieve Goals: Fair Progress towards PT goals: Progressing toward goals    Frequency    Min 3X/week      PT Plan Current plan remains appropriate;Frequency needs to be updated    Co-evaluation PT/OT/SLP Co-Evaluation/Treatment: Yes Reason for Co-Treatment: For patient/therapist safety;To address functional/ADL transfers PT goals addressed during session: Mobility/safety with mobility OT goals addressed during session: ADL's and self-care      AM-PAC PT "6 Clicks" Mobility   Outcome Measure  Help needed turning from your back to your side while in a flat bed without using bedrails?: A Little Help needed moving from lying on your back to sitting on the side of a flat bed without using bedrails?: A Little Help needed moving to and from a bed to a chair (including a wheelchair)?: A Little Help needed standing up from a chair using your arms (e.g., wheelchair or bedside chair)?: A Little Help needed to walk in hospital room?: A Little Help needed climbing 3-5 steps with a railing? : A Lot 6 Click Score: 17    End of Session   Activity Tolerance: Patient tolerated treatment well Patient left: in chair;with call bell/phone within reach;with chair alarm set Nurse Communication: Mobility status PT Visit Diagnosis: Difficulty in walking, not elsewhere classified (R26.2);Unsteadiness on feet (R26.81);Other abnormalities of gait and mobility (R26.89)     Time: 3976-7341 PT Time Calculation (min) (ACUTE ONLY): 35 min  Charges:  $Gait Training: 8-22 mins                     04/10/2020  Ginger Carne., PT Acute Rehabilitation Services [phone]  (pager) [phone]  (office)   Tessie Fass Kaylub Detienne 04/10/2020, 4:00 PM

## 2020-04-10 NOTE — Progress Notes (Addendum)
NAME:  Jordan [name], MRN:  [id number], DOB:  21-Jun-1966, LOS: 6 ADMISSION DATE:  04/03/2020, CONSULTATION DATE:  04/07/2020 REFERRING MD:  Dr. Chana Bode, [facility], CHIEF COMPLAINT:  AMS   Brief History   54 yo male smoker with hx of ESRD on iHD at welfare check at home after missing HD for 2 weeks.  Found to have hypoxia from acute pulmonary edema, hyperkalemia and a fib with RVR.  Seen by nephrology and cardiology.  Developed agitation and confusion, and started on CIWA protocol.  Developed altered mental status and respiratory arrest requiring intubation 7/17 and transferred to ICU.  Past Medical History  ESRD, systolic/diastolic CHF, HTN, ETOH, Emphysema, ED  Significant Hospital Events   7/13 admit, iHD, Bipap 7/14 amiodarone gtt started, transfuse PRBC 7/15 CIWA protocol started 7/16 more confused/agitated 7/17 respiratory arrest/VDRF, transfer to ICU; self extubated himself 7/18 started on precedex  Consults:  Nephrology Cardiology  Procedures:  ETT 7/17 >> 7/17  Significant Diagnostic Tests:  Echo 7/15 >> EF 45 to 50%, mod LVH, moderately enlarged RV, PASP 53.7, small/mod pericardial effusion  Micro Data:  COVID 7/13 >> negative  MRSA PCR 7/17 >>   Antimicrobials:    Interim history/subjective:  No significant overnight events.  Objective   Blood pressure (!) 93/57, pulse (!) 57, temperature 97.8 F (36.6 C), temperature source Axillary, resp. rate (!) 25, height 5\' 4"  (1.626 m), weight 96.2 kg, SpO2 100 %.        Intake/Output Summary (Last 24 hours) at 04/10/2020 0708 Last data filed at 04/10/2020 0500 Gross per 24 hour  Intake 591.16 ml  Output -490 ml  Net 1081.16 ml   Filed Weights   04/07/20 1726 04/08/20 0437 04/09/20 0321  Weight: 67.6 kg 96.5 kg 96.2 kg    Examination:  General: chronically ill appearing HEENT: NCAT Cardiac: bradycardic. Regular rhythm. Extremities warm Pulm: breathing comfortably on 1LNC. Lungs clear anteriorly Abd:  soft Neuro: does not waken to voice, not following commands.  Psych: unable to assess due to sedation Skin: no rash  Resolved Hospital Problem list   Hyperkalemia, thrombocytopenia, Acute hypoxic respiratory failure with compromised airway from acute pulmonary edema  Assessment & Plan:   Acute metabolic encephalopathy 2nd to hypoxia, uremia. ETOH withdrawal. Does not have capacity to refuse care/ make his own decisions at this point- unclear chronicity. -continue seroquel and olanzapine. Would prefer to titrate up on these rather than continue precedex  -wean precedex with RASS goal 0 to 1 -con't thiamine, folic acid -SW to assist patient in setting up outpatient appt with psychiatry prior to discharge  New onset A- fib with RVR. RVR resolved--now sinus bradycardic with HR in mid 50s. Intermittently hypotensive which is likely an effect of the amiodarone Acute on chronic combined CHF--EF 45-50% Pericardial effusion likely in setting of uremia Hx of HTN - con't amiodarone and coreg; appreciate cardiology's assistance - agree that he is most likely not a suitable candidate for long term anticoagulation given history of non compliance  ESRD on HD MWF Metabolic bone disease - iHD per renal -renal diet -renally dose meds  Acute respiratory failure with hypoxia, likely acute pulmonary edema from hypervolemia. Down to 1L [place] Emphysema, tobacco abuse. - prn BDs -wean O2 as able  Anemia of critical illness and chronic disease. - f/u CBC intermittently - transfuse of Hb < 7 or significant bleeding  Hyperglycemia- had a couple of hypoglycemic events in last 24h with blood sugars running <100 even in-between those events. Continue  very sensitive SSI with HS coverage.   Best practice:  Diet: renal diet DVT prophylaxis: SQ heparin GI prophylaxis: protonix Mobility: bed rest Code Status: full code Disposition: ICU Family: will update today  Labs    CMP Latest Ref Rng & Units  04/09/2020 04/08/2020 04/07/2020  Glucose 70 - 99 mg/dL 108(H) 77 -  BUN 6 - 20 mg/dL 40(H) 20 -  Creatinine 0.61 - 1.24 mg/dL 6.77(H) 4.63(H) -  Sodium 135 - 145 mmol/L 138 136 139  Potassium 3.5 - 5.1 mmol/L 3.7 3.7 3.8  Chloride 98 - 111 mmol/L 97(L) 97(L) -  CO2 22 - 32 mmol/L 27 27 -  Calcium 8.9 - 10.3 mg/dL 9.1 9.3 -  Total Protein 6.5 - 8.1 g/dL - - -  Total Bilirubin 0.3 - 1.2 mg/dL - - -  Alkaline Phos 38 - 126 U/L - - -  AST 15 - 41 U/L - - -  ALT 0 - 44 U/L - - -    CBC Latest Ref Rng & Units 04/09/2020 04/07/2020 04/07/2020  WBC 4.0 - 10.5 K/uL 8.5 - 10.4  Hemoglobin 13.0 - 17.0 g/dL 7.7(L) 9.2(L) 8.8(L)  Hematocrit 39 - 52 % 24.5(L) 27.0(L) 30.4(L)  Platelets 150 - 400 K/uL 169 - 188    ABG    Component Value Date/Time   PHART 7.321 (L) 04/07/2020 2054   PCO2ART 64.8 (H) 04/07/2020 2054   PO2ART 104 04/07/2020 2054   HCO3 33.5 (H) 04/07/2020 2054   TCO2 35 (H) 04/07/2020 2054   ACIDBASEDEF 0.3 04/05/2020 1435   O2SAT 97.0 04/07/2020 2054    CBG (last 3)  Recent Labs    04/09/20 2307 04/10/20 0311 04/10/20 [place], MD Internal Medicine Resident PGY 2 04/10/20 8:13 AM

## 2020-04-10 NOTE — Discharge Summary (Signed)
Physician Discharge Summary  Patient ID: Jordan [name] MRN: [id number] DOB/AGE: December 23, 1965 54 y.o.  Admit date: 04/03/2020 Discharge date: 04/10/2020  Admission Diagnoses:  Discharge Diagnoses:  Principal Problem:   Acute on chronic congestive heart failure ([place]) Active Problems:   ESRD on dialysis [facility])   Essential hypertension   Anemia in ESRD (end-stage renal disease) (HCC)   Fluid overload   Hyperkalemia   Non-compliance with renal dialysis (HCC)   Atrial fibrillation with RVR (HCC)   Acute on chronic systolic CHF (congestive heart failure) (HCC)   Depression   Current mild episode of major depressive disorder without prior episode [facility])   Discharged Condition: poor  Hospital Course:  Patient was admitted after he did not go to dialysis from 2 weeks.  He was agitated and confused on floor, hypoxemic and ultimately was intubated on 7/17 but self-extubated same day.  His hypoxemia improved with dialysis.  He also developed reactive Afib/RVR, echo showing small uremic pericardial effusion, reduced EF.  Was started on amiodarone and coreg by cardiology.  These were not continued as patient was signing out AMA and the Afib was reactive.  He can start them if he can show that he can f/u with cardiology as an outpatient.  He was deemed to be a poor AC candidate due to his compliance issues.    He had periods of aggression toward staff and confusion that improved with antipsychotics.  He was seen by psychiatry who did not see any inpatient needs and cleared him.  His mental status improved to point where he was nearing readiness for discharge. Unfortunately he was not willing to stay another night in ICU on 7/20 and demanded discharge against medical advice during night shift.  He was oriented and displayed appropriate insight on consequences of missing dialysis in future.  He also stated he would start taking the antipsychotics prescribed and said he would follow up with providers  listed in discharge packet.  Consults: cardiology, pulmonary/intensive care, nephrology and psychiatry  Significant Diagnostic Studies:    Treatments: dialysis: Hemodialysis   Disposition: Discharge disposition: 07-Left Against Medical Advice/Left Without Being Seen/Elopement        Allergies as of 04/10/2020   No Known Allergies     Medication List    STOP taking these medications   triamcinolone cream 0.1 % Commonly known as: KENALOG     TAKE these medications   calcium acetate 667 MG capsule Commonly known as: PHOSLO Take 2 capsules (1,334 mg total) by mouth 3 (three) times daily with meals. What changed: how much to take   OLANZapine 2.5 MG tablet Commonly known as: ZYPREXA Take 1 tablet (2.5 mg total) by mouth at bedtime.   patiromer 8.4 g packet Commonly known as: VELTASSA Take 1 packet (8.4 g total) by mouth daily.   Sarna lotion Generic drug: camphor-menthol Apply 1 application topically as needed for itching.   sevelamer carbonate 800 MG tablet Commonly known as: RENVELA Take 800 mg by mouth 3 (three) times daily with meals.       Follow-up Information    [facility]. Schedule an appointment as soon as possible for a visit in 2 day(s).   Specialty: Cardiology Why: Call the cardiology office at the above number to make a follow up appointment regardin atrial fibrillation.  Contact information: [address] [facility]. Schedule an appointment as soon as  possible for a visit.   Specialty: Behavioral Health Why: Please call to make an appointment with behavioral health regarding depression.  Contact information: [place]       Jearld Fenton, NP. Schedule an appointment as soon as possible for a visit.   Specialties: Internal Medicine, Emergency  Medicine Why: Please call your primary care practice at the number above to arrange a hospital follow up appointment.  Contact information: [place] [id number] [phone]               Signed: Candee Furbish 04/10/2020, 9:21 PM

## 2020-04-10 NOTE — Progress Notes (Signed)
Occupational Therapy Evaluation Patient Details Name: Jordan [name] MRN: [id number] DOB: 07-Nov-1965 Today's Date: 04/10/2020    History of Present Illness 54 y.o. male with a hx of ESRD noncompliant with HD,anemia of chronic kidney disease, recurrent hyperkalemia, secondary hyperparathyroidism, history of EtOH and tobacco abusewho is being seen today for the evaluation of new onset atrial fibrillationat the request of Dr. Alfredia Ferguson.   Clinical Impression   PTA, pt was living at home alone, pt was independent with ADL/IADL and functional mobility and was driving and working. Pt currently demonstrates generalized weakness, instability, and cognitive limitations resulting in him requiring min guard to minA for completion of grooming, LB dressing and functional mobility without AD. Due to decline in current level of function, pt would benefit from acute OT to completion of IADL (ex. Medication management), safe engagement of ADL while standing, and increasing strength and stability during transfers and navigating environment.  At this time, recommend SNF follow-up. Will continue to follow acutely.     Follow Up Recommendations  SNF;Supervision/Assistance - 24 hour    Equipment Recommendations  Other (comment) (TBD)    Recommendations for Other Services       Precautions / Restrictions Precautions Precautions: Fall Restrictions Weight Bearing Restrictions: No      Mobility Bed Mobility Overal bed mobility: Needs Assistance Bed Mobility: Supine to Sit     Supine to sit: Min guard     General bed mobility comments: minguard for safety  Transfers Overall transfer level: Needs assistance Equipment used: 2 person hand held assist Transfers: Sit to/from Stand Sit to Stand: Min guard         General transfer comment: guard for safety/stability    Balance Overall balance assessment: Needs assistance Sitting-balance support: No upper extremity supported;Feet  supported Sitting balance-Leahy Scale: Fair Sitting balance - Comments: donned socks while sitting EOB   Standing balance support: No upper extremity supported;During functional activity Standing balance-Leahy Scale: Fair Standing balance comment: based on a standing activity at the sink                           ADL either performed or assessed with clinical judgement   ADL Overall ADL's : Needs assistance/impaired Eating/Feeding: Modified independent;Sitting Eating/Feeding Details (indicate cue type and reason): pt ate peanut butter while sitting in recliner Grooming: Min guard;Standing Grooming Details (indicate cue type and reason): washed hands and face standing at sink level Upper Body Bathing: Min guard;Sitting   Lower Body Bathing: Min guard;Sit to/from stand   Upper Body Dressing : Set up;Sitting   Lower Body Dressing: Min guard;Sit to/from stand Lower Body Dressing Details (indicate cue type and reason): pt donned socks sitting EOB cues to continue completion of task Toilet Transfer: Minimal assistance;Ambulation Toilet Transfer Details (indicate cue type and reason): pt ambulated with minA for stability and correction of instability Toileting- Clothing Manipulation and Hygiene: Min guard;Sit to/from stand       Functional mobility during ADLs: Minimal assistance General ADL Comments: pt demonstrates instability during functional mobiltiy requiring physical assistance from therapists to correct instability, pt with greater instability during turning;pt limited by decreased activity tolerance, instability and generalized weakness     Vision Patient Visual Report: No change from baseline Additional Comments: continue to assess     Perception     Praxis      Pertinent Vitals/Pain Pain Assessment: No/denies pain     Hand Dominance Right   Extremity/Trunk Assessment Upper Extremity Assessment  Upper Extremity Assessment: Generalized weakness   Lower  Extremity Assessment Lower Extremity Assessment: Generalized weakness   Cervical / Trunk Assessment Cervical / Trunk Assessment: Normal   Communication Communication Communication: No difficulties   Cognition Arousal/Alertness: Awake/alert Behavior During Therapy: WFL for tasks assessed/performed;Flat affect Overall Cognitive Status: Impaired/Different from baseline Area of Impairment: Attention;Safety/judgement;Awareness;Problem solving                   Current Attention Level: Selective     Safety/Judgement: Decreased awareness of safety;Decreased awareness of deficits Awareness: Emergent;Intellectual Problem Solving: Slow processing General Comments: pt requiring cues for safety, pt with instability during functional mobility required assistance from therapists to correct;pt required increased time for processing   General Comments  SpO2 >94% with good wave form, poor wave form during functional mobility, pt DoE 3/4 at end of session following toileting    Exercises     Shoulder Instructions      Home Living Family/patient expects to be discharged to:: Private residence Living Arrangements: Alone Available Help at Discharge: Family;Available PRN/intermittently Type of Home: House Home Access: Stairs to enter Entrance Stairs-Number of Steps: 4   Home Layout: One level     Bathroom Shower/Tub: Tub/shower unit         Home Equipment: Shower seat          Prior Functioning/Environment Level of Independence: Independent        Comments: 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, agricultural Problem List: Decreased activity tolerance;Impaired balance (sitting and/or standing);Decreased cognition;Decreased safety awareness;Decreased knowledge of use of DME or AE;Cardiopulmonary status limiting activity      OT Treatment/Interventions: Self-care/ADL training;Therapeutic exercise;Energy conservation;DME and/or AE  instruction;Therapeutic activities;Cognitive remediation/compensation;Patient/family education;Balance training    OT Goals(Current goals can be found in the care plan section) Acute Rehab OT Goals Patient Stated Goal: unable to state OT Goal Formulation: With patient Time For Goal Achievement: 04/24/20 Potential to Achieve Goals: Good ADL Goals Pt Will Perform Grooming: Independently Pt Will Perform Lower Body Dressing: Independently;sit to/from stand Pt Will Perform Toileting - Clothing Manipulation and hygiene: with modified independence;sit to/from stand Additional ADL Goal #1: Pt will demonstrate anticipatory awareness during ADL/IADL and functional mobility for safe engagement in his environment. Additional ADL Goal #2: Pt will complete multistep cognitive task with <3 errors for safe engagement in ADL/IADL.  OT Frequency: Min 2X/week   Barriers to D/C: Decreased caregiver support  pt lives alone       Co-evaluation PT/OT/SLP Co-Evaluation/Treatment: Yes Reason for Co-Treatment: For patient/therapist safety;To address functional/ADL transfers PT goals addressed during session: Mobility/safety with mobility OT goals addressed during session: ADL's and self-care      AM-PAC OT "6 Clicks" Daily Activity     Outcome Measure Help from another person eating meals?: A Little Help from another person taking care of personal grooming?: A Little Help from another person toileting, which includes using toliet, bedpan, or urinal?: A Little Help from another person bathing (including washing, rinsing, drying)?: A Little Help from another person to put on and taking off regular upper body clothing?: A Little Help from another person to put on and taking off regular lower body clothing?: A Little 6 Click Score: 18   End of Session Equipment Utilized During Treatment: Oxygen (<1lnc) Nurse Communication: Mobility status  Activity Tolerance: Patient tolerated treatment well Patient left:  in chair;with call bell/phone within reach;with chair alarm set  OT Visit Diagnosis:  Unsteadiness on feet (R26.81);Other abnormalities of gait and mobility (R26.89);Other symptoms and signs involving cognitive function                Time: 1457-1536 OT Time Calculation (min): 39 min Charges:  OT General Charges $OT Visit: 1 Visit OT Evaluation $OT Eval Moderate Complexity: 1 Mod OT Treatments $Self Care/Home Management : 8-22 mins  Helene Kelp OTR/L Acute Rehabilitation Services Office: [phone]   Wyn Forster 04/10/2020, 4:03 PM

## 2020-04-10 NOTE — Progress Notes (Signed)
Patient ID: Jordan [name], male   DOB: 13-Oct-1965, 54 y.o.   MRN: [id number] [facility] Progress Note   Assessment/ Plan:   1.  Acute hypoxic respiratory failure secondary to pulmonary edema/volume overload: He suffered acute respiratory failure and was intubated; by evening had self extubated himself.  Supportive care with oxygen as needed   2. ESRD: Continue hemodialysis on Monday/Wednesday/Friday schedule.   Ordered renal panel daily   3. Anemia of ESRD: Continue ESA while here in the hospital, previously poor adherence with dialysis unfortunately has resulted in inconsistent ESA delivery.  CBC in AM.  S/p aranesp 150 mcg once on 7/14.  Reordered for weekly for now  4. CKD-MBD: Calcium and phosphorus levels currently at goal, continue to monitor on sevelamer.    5.  New onset atrial fibrillation with rapid ventricular response: Seen by cardiology. on oral amiodarone.  Would decrease coreg if ok with cardiology - UF with HD is limited by hypotension  6. Hypertension: hypotension with HD.  Would decrease coreg as above if ok with cardiology.  Note precedex as well   7. Noncompliance with dialysis s/p inpatient psychiatry consult.  Appreciate psych and primary team.  Patient with anxiety/depression/passive suicidal behavior. Adherence has been a challenge in the past and his pattern of rehospitalization will continue unless his mental health issues are addressed.   Subjective:   Last HD on 7/19 with UF limited by hypotension - with goal of keep even he was actually 500 mL positive.  Note his coreg was increased yesterday per cardiology.  He was also started on precedex infusion.  He has continued on oxygen at 3 liters.  Question weights - wide variation.     Review of systems:  Unable to obtain given pt sedated on precedex   Objective:   BP (!) 93/57   Pulse (!) 57   Temp 98.2 F (36.8 C) (Axillary)   Resp (!) 25   Ht 5\' 4"  (1.626 m)   Wt 96.2 kg   SpO2 100%   BMI  36.40 kg/m   Physical Exam: Gen: adult male in bed on sedation CVS: S1 and S2 ; no rub Resp transmitted upper airway sounds and unlabored; on [place] Abd: Soft nontender ND Ext: No lower extremity edema Neuro - precedex gtt running  Access: left upper arm AV fistula with bruit and thrill    Labs: BMET Recent Labs  Lab 04/03/20 2230 04/03/20 2230 04/04/20 0500 04/04/20 0500 04/05/20 0500 04/06/20 0506 04/07/20 0838 04/07/20 1159 04/07/20 2054 04/08/20 0350 04/09/20 0258  NA 141   < > 140   < > 141 139 139 138 139 136 138  K 4.2   < > 5.3*   < > 4.0 3.4* 4.6 4.0 3.8 3.7 3.7  CL 101   < > 100  --  98 97* 97* 97*  --  97* 97*  CO2 21*   < > 20*  --  27 26 29 25   --  27 27  GLUCOSE 167*   < > 138*  --  82 118* 182* 114*  --  77 108*  BUN 95*   < > 100*  --  59* 40* 32* 35*  --  20 40*  CREATININE 10.43*   < > 12.51*  --  9.01* 6.97* 6.50* 6.57*  --  4.63* 6.77*  CALCIUM 9.4   < > 8.9  --  9.4 9.2 9.6 8.9  --  9.3 9.1  PHOS 4.4  --   --   --  6.1* 4.9* 6.1* 6.1*  --   --  3.0   < > = values in this interval not displayed.   CBC Recent Labs  Lab 04/03/20 1710 04/03/20 1737 04/05/20 0500 04/05/20 0500 04/06/20 0506 04/07/20 0838 04/07/20 2054 04/09/20 0258  WBC 7.1   < > 7.2  --  8.6 10.4  --  8.5  NEUTROABS 6.0  --  5.7  --  6.7 7.8*  --   --   HGB 7.1*   < > 8.3*   < > 8.5* 8.8* 9.2* 7.7*  HCT 25.0*   < > 26.3*   < > 27.3* 30.4* 27.0* 24.5*  MCV 116.3*   < > 101.9*  --  105.0* 109.0*  --  101.7*  PLT 165   < > 126*  --  146* 188  --  169   < > = values in this interval not displayed.      Medications:    . amiodarone  200 mg Oral BID  . carvedilol  6.25 mg Oral BID WC  . Chlorhexidine Gluconate Cloth  6 each Topical Q0600  . diphenhydrAMINE  25 mg Intravenous Once  . folic acid  1 mg Oral Daily  . heparin injection (subcutaneous)  5,000 Units Subcutaneous Q8H  . insulin aspart  0-5 Units Subcutaneous QHS  . insulin aspart  0-6 Units Subcutaneous TID WC  .  multivitamin with minerals  1 tablet Oral Daily  . OLANZapine zydis  2.5 mg Oral QHS  . pantoprazole sodium  40 mg Oral Daily  . QUEtiapine  25 mg Oral BID  . sevelamer carbonate  800 mg Oral TID WC  . sodium chloride flush  3 mL Intravenous Q12H  . thiamine  100 mg Oral Daily   Claudia Desanctis, MD 04/10/2020, 6:26 AM

## 2020-04-10 NOTE — Progress Notes (Signed)
Patient very agitated, swearing at staff, threatening to hit and "kill" staff. E-link video called to assess patient. Medication given to help calm patient. Will continue to monitor.

## 2020-04-10 NOTE — Discharge Instructions (Signed)
-   Please go to dialysis - Please follow up with a psychiatrist, number provided - Take the medication for mood that we are prescribing - Please follow up with a heart doctor, number provided  You are leaving against medical advice which means that you understand that you are risking your health and life by leaving care with a doctor's clearance.  You have stated you understand this risk and wish to proceed with going home.

## 2020-04-11 ENCOUNTER — Telehealth: Payer: Self-pay | Admitting: Nephrology

## 2020-04-11 ENCOUNTER — Other Ambulatory Visit: Payer: Self-pay

## 2020-04-11 ENCOUNTER — Emergency Department: Payer: Medicare Other

## 2020-04-11 ENCOUNTER — Emergency Department
Admission: EM | Admit: 2020-04-11 | Discharge: 2020-04-11 | Disposition: A | Discharge status: Home / Self Care | Payer: Medicare Other | Attending: Emergency Medicine | Admitting: Emergency Medicine

## 2020-04-11 DIAGNOSIS — Z743 Need for continuous supervision: Secondary | ICD-10-CM | POA: Diagnosis not present

## 2020-04-11 DIAGNOSIS — I120 Hypertensive chronic kidney disease with stage 5 chronic kidney disease or end stage renal disease: Secondary | ICD-10-CM | POA: Diagnosis not present

## 2020-04-11 DIAGNOSIS — L299 Pruritus, unspecified: Secondary | ICD-10-CM | POA: Insufficient documentation

## 2020-04-11 DIAGNOSIS — R21 Rash and other nonspecific skin eruption: Secondary | ICD-10-CM | POA: Diagnosis present

## 2020-04-11 DIAGNOSIS — R0902 Hypoxemia: Secondary | ICD-10-CM | POA: Diagnosis not present

## 2020-04-11 DIAGNOSIS — N2581 Secondary hyperparathyroidism of renal origin: Secondary | ICD-10-CM | POA: Diagnosis not present

## 2020-04-11 DIAGNOSIS — I5043 Acute on chronic combined systolic (congestive) and diastolic (congestive) heart failure: Secondary | ICD-10-CM | POA: Insufficient documentation

## 2020-04-11 DIAGNOSIS — N186 End stage renal disease: Secondary | ICD-10-CM | POA: Diagnosis not present

## 2020-04-11 DIAGNOSIS — F17210 Nicotine dependence, cigarettes, uncomplicated: Secondary | ICD-10-CM | POA: Diagnosis not present

## 2020-04-11 DIAGNOSIS — Z992 Dependence on renal dialysis: Secondary | ICD-10-CM | POA: Insufficient documentation

## 2020-04-11 DIAGNOSIS — R0602 Shortness of breath: Secondary | ICD-10-CM | POA: Diagnosis not present

## 2020-04-11 DIAGNOSIS — I132 Hypertensive heart and chronic kidney disease with heart failure and with stage 5 chronic kidney disease, or end stage renal disease: Secondary | ICD-10-CM | POA: Insufficient documentation

## 2020-04-11 DIAGNOSIS — J811 Chronic pulmonary edema: Secondary | ICD-10-CM | POA: Diagnosis not present

## 2020-04-11 DIAGNOSIS — E8770 Fluid overload, unspecified: Secondary | ICD-10-CM | POA: Diagnosis not present

## 2020-04-11 DIAGNOSIS — D631 Anemia in chronic kidney disease: Secondary | ICD-10-CM | POA: Diagnosis not present

## 2020-04-11 DIAGNOSIS — I10 Essential (primary) hypertension: Secondary | ICD-10-CM | POA: Diagnosis not present

## 2020-04-11 LAB — CBC WITH DIFFERENTIAL/PLATELET
Abs Immature Granulocytes: 0.09 10*3/uL — ABNORMAL HIGH (ref 0.00–0.07)
Basophils Absolute: 0 10*3/uL (ref 0.0–0.1)
Basophils Relative: 0 %
Eosinophils Absolute: 0.3 10*3/uL (ref 0.0–0.5)
Eosinophils Relative: 3 %
HCT: 26.3 % — ABNORMAL LOW (ref 39.0–52.0)
Hemoglobin: 8.2 g/dL — ABNORMAL LOW (ref 13.0–17.0)
Immature Granulocytes: 1 %
Lymphocytes Relative: 7 %
Lymphs Abs: 0.7 10*3/uL (ref 0.7–4.0)
MCH: 31.8 pg (ref 26.0–34.0)
MCHC: 31.2 g/dL (ref 30.0–36.0)
MCV: 101.9 fL — ABNORMAL HIGH (ref 80.0–100.0)
Monocytes Absolute: 1.3 10*3/uL — ABNORMAL HIGH (ref 0.1–1.0)
Monocytes Relative: 13 %
Neutro Abs: 7.8 10*3/uL — ABNORMAL HIGH (ref 1.7–7.7)
Neutrophils Relative %: 76 %
Platelets: 179 10*3/uL (ref 150–400)
RBC: 2.58 MIL/uL — ABNORMAL LOW (ref 4.22–5.81)
RDW: 18.6 % — ABNORMAL HIGH (ref 11.5–15.5)
WBC: 10.1 10*3/uL (ref 4.0–10.5)
nRBC: 0 % (ref 0.0–0.2)

## 2020-04-11 LAB — VITAMIN B1: Vitamin B1 (Thiamine): 383.7 nmol/L — ABNORMAL HIGH (ref 66.5–200.0)

## 2020-04-11 LAB — COMPREHENSIVE METABOLIC PANEL
ALT: 17 U/L (ref 0–44)
AST: 28 U/L (ref 15–41)
Albumin: 4 g/dL (ref 3.5–5.0)
Alkaline Phosphatase: 123 U/L (ref 38–126)
Anion gap: 15 (ref 5–15)
BUN: 56 mg/dL — ABNORMAL HIGH (ref 6–20)
CO2: 26 mmol/L (ref 22–32)
Calcium: 9.7 mg/dL (ref 8.9–10.3)
Chloride: 99 mmol/L (ref 98–111)
Creatinine, Ser: 7.41 mg/dL — ABNORMAL HIGH (ref 0.61–1.24)
GFR calc Af Amer: 9 mL/min — ABNORMAL LOW (ref >60)
GFR calc non Af Amer: 8 mL/min — ABNORMAL LOW (ref >60)
Glucose, Bld: 76 mg/dL (ref 70–99)
Potassium: 4 mmol/L (ref 3.5–5.1)
Sodium: 140 mmol/L (ref 135–145)
Total Bilirubin: 1.4 mg/dL — ABNORMAL HIGH (ref 0.3–1.2)
Total Protein: 8.5 g/dL — ABNORMAL HIGH (ref 6.5–8.1)

## 2020-04-11 IMAGING — DX DG CHEST 1V PORT
1 series · 1 of 1 positions shown · non-contrast
Comparison: [DATE]

CLINICAL DATA: Shortness of breath, missed dialysis

EXAM:
PORTABLE CHEST 1 VIEW

[chest ap]
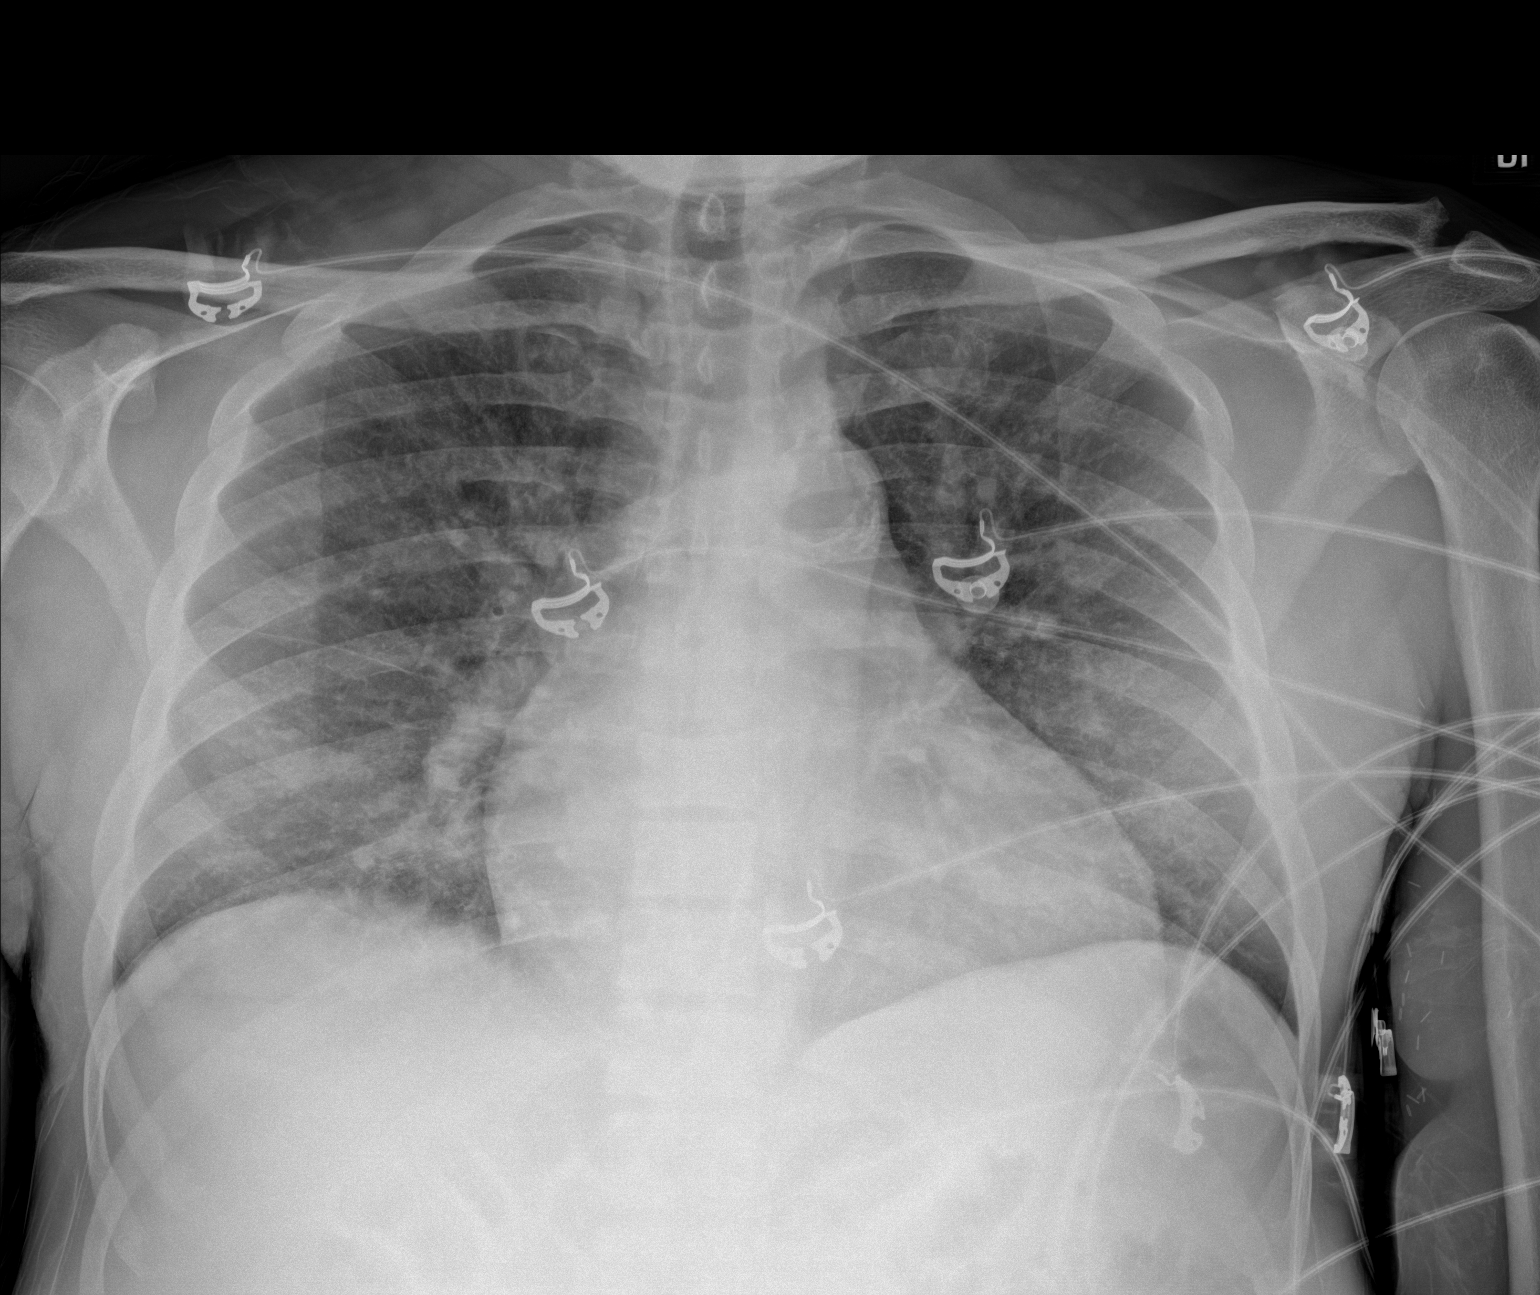

[1 of 1 positions shown; findings below may reference images not displayed]

FINDINGS: Lines and tubes are no longer present. Low lung volumes. Pulmonary
vascular congestion with probable mild edema. No pleural effusions.
No pneumothorax. Top normal heart size.
IMPRESSION: Pulmonary vascular congestion and probable mild edema.

## 2020-04-11 MED ORDER — HYDROXYZINE HCL 50 MG PO TABS
50.0000 mg | ORAL_TABLET | Freq: Once | ORAL | Status: AC
  Administered 2020-04-11: 50 mg via ORAL
  Filled 2020-04-11: qty 1

## 2020-04-11 MED ORDER — HYDROXYZINE HCL 50 MG PO TABS
50.0000 mg | ORAL_TABLET | Freq: Three times a day (TID) | ORAL | 0 refills | Status: DC | PRN
Start: 2020-04-11 — End: 2020-06-04

## 2020-04-11 MED ORDER — QUETIAPINE FUMARATE 25 MG PO TABS
25.0000 mg | ORAL_TABLET | Freq: Once | ORAL | Status: AC
  Administered 2020-04-11: 25 mg via ORAL
  Filled 2020-04-11: qty 1

## 2020-04-11 MED ORDER — DEXAMETHASONE SODIUM PHOSPHATE 10 MG/ML IJ SOLN
10.0000 mg | Freq: Once | INTRAMUSCULAR | Status: AC
  Administered 2020-04-11: 10 mg via INTRAMUSCULAR
  Filled 2020-04-11: qty 1

## 2020-04-11 NOTE — TOC Transition Note (Signed)
Transition of Care [facility]) - CM/SW Discharge Note   Patient Details  Name: Jordan [name] MRN: [id number] Date of Birth: 02-21-1966  Transition of [facility]) CM/SW Contact:  Anselm Pancoast, RN Phone Number: 04/11/2020, 11:37 AM   Clinical Narrative:    Confirmed with Amanda-Dialysis Navigator patient can return to his previous dialysis center. Patient can arrive anytime today and will be worked in for dialysis. RN CM went to visit patient in room and patient was already discharged.          Patient Goals and CMS Choice        Discharge Placement                       Discharge Plan and Services                                     Social Determinants of Health (SDOH) Interventions     Readmission Risk Interventions No flowsheet data found.

## 2020-04-11 NOTE — ED Notes (Addendum)
States he is here for dialysis  States his Dr told him told come here for treatment  Pt is SOB and abd is tight  Spoke with his dialysis center they will not be able to take him b/c he has missed about 21/2 weeks of treatment  Charge nurse aware  Pt will be moved to room 7

## 2020-04-11 NOTE — Telephone Encounter (Signed)
Transition of care contact from inpatient facility  Date of Discharge: 04/10/20 Date of Contact: 04/11/20 Method of contact: Phone  Attempted to contact patient to discuss transition of care from inpatient admission. Patient did not answer the phone -number not in service.

## 2020-04-11 NOTE — ED Notes (Signed)
See triage note  Presents with itching  States he was discharge from [facility] yesterday  He thinks they gave him something that broke him out  He has several sore areas to legs

## 2020-04-11 NOTE — ED Triage Notes (Signed)
Pt comes into the ED via EMS from home with c/o itching all over since being admitted for psych on monday. States he missed dialysis all last week and went on Monday and they kept him for eval, states he recently lost his son. Pt states he is only here for the itching, states it feels like something is biting me, states he took 2 benadryl today

## 2020-04-11 NOTE — ED Notes (Signed)
Extra red tube sent to lab

## 2020-04-11 NOTE — Discharge Instructions (Addendum)
Follow discharge care instruction take medication as directed.  Please continue your dialysis as scheduled.  You can pick up the anti-itching medication, hydroxyzine, at the [facility] on [address].  Return to the ED with any worsening symptoms.

## 2020-04-11 NOTE — ED Provider Notes (Signed)
[facility] Emergency Department Provider Note ____________________________________________   First MD Initiated Contact with Patient 04/11/20 [phone]     (approximate)  I have reviewed the triage vital signs and the nursing notes.  HISTORY  Chief Complaint Rash   HPI Jordan [name] is a 54 y.o. male who presents to the ED for evaluation of shortness of breath and rash.    History of ESRD on M/W/F IHD.  Chart review indicates patient was admitted to the ICU from 7/13-7/20, leaving AMA last night.  Hypoxemic from missed dialysis, A. fib with RVR and intermittent aggression/confusion.   Patient initially seen in our fast track by PA Mardee Postin and provided hydroxyzine with improvement of his symptoms.  Patient then began to complain of shortness of breath, and so he was transferred to my care.  During my history taking, patient does not indicate any of the above.  He reports he was on his back porch at home 2 days ago and got picked up by the cops and firefighters.  He provides tangential history, frequently returning to the itching sensation in his bilateral legs.    Past Medical History:  Diagnosis Date  . Anemia   . Aortic atherosclerosis ([place]) 11/12/2019  . CKD (chronic kidney disease)    Stage 5  Dialysis - M/W/F in [place], [place]  . Dyspnea    tx with inhaler when sick  . ED (erectile dysfunction)   . Emphysema of lung ([place]) 11/12/2019  . ETOH abuse   . History of blood transfusion   . Hypertension   . Wears dentures     Patient Active Problem List   Diagnosis Date Noted  . Depression 04/09/2020  . Current mild episode of major depressive disorder without prior episode ([place]) 04/09/2020  . Atrial fibrillation with RVR ([place]) 04/04/2020  . Acute on chronic systolic CHF (congestive heart failure) ([place]) 04/04/2020  . Non-compliance with renal dialysis ([place]) 03/21/2020  . Acute CHF (congestive heart failure) ([place]) 03/12/2020  . Hypertensive  heart and chronic kidney disease stage 5 ([place]) 02/03/2020  . Acute on chronic combined systolic and diastolic CHF (congestive heart failure) ([place]) 02/03/2020  . Pulmonary edema 01/31/2020  . Syncopal episodes 01/24/2020  . Macrocytic anemia 01/24/2020  . Hyperkalemia 01/24/2020  . Fluid overload 11/12/2019  . Acute respiratory failure with hypoxia ([place]) 11/12/2019  . Aortic atherosclerosis ([place]) 11/12/2019  . Emphysema of lung ([place]) 11/12/2019  . Acute pulmonary edema ([place]) 10/31/2019  . Acute on chronic congestive heart failure ([place]) 10/22/2019  . Acute respiratory distress 10/06/2019  . Volume overload 10/05/2019  . Hypokalemia 10/05/2019  . ESRD (end stage renal disease) ([place]) 10/01/2019  . Leg mass, right   . Hyperbilirubinemia   . Symptomatic anemia 09/19/2019  . Pruritus 09/19/2019  . Hemoptysis 02/11/2019  . Hypercalcemia 01/19/2019  . Elevated troponin 05/17/2018  . Anemia in ESRD (end-stage renal disease) ([place]) 05/17/2018  . Hepatitis B 03/19/2018  . ESRD on dialysis ([place]) 05/26/2017  . Essential hypertension 05/26/2017  . Urinary tract infection, site not specified 10/17/2016  . Coagulation defect, unspecified ([place]) 08/13/2016  . Diarrhea, unspecified 08/13/2016  . Encounter for immunization 08/13/2016  . Headache, unspecified 08/13/2016  . Hypertensive chronic kidney disease with stage 5 chronic kidney disease or end stage renal disease ([place]) 08/13/2016  . Iron deficiency anemia, unspecified 08/13/2016  . Pain, unspecified 08/13/2016  . Moderate protein-calorie malnutrition ([place]) 08/13/2016  . Secondary hyperparathyroidism of renal origin ([place]) 08/13/2016    Past  Surgical History:  Procedure Laterality Date  . BASCILIC VEIN TRANSPOSITION Left 08/07/2016   Procedure: LEFT BASILIC VEIN TRANSPOSITION;  Surgeon: Angelia Mould, MD;  Location: [facility];  Service: Vascular;  Laterality: Left;  . CLOSED REDUCTION NASAL FRACTURE N/A 08/11/2019   Procedure: CLOSED  REDUCTION NASAL FRACTURE WITH STABILIZATION;  Surgeon: Irene Limbo, MD;  Location: [facility];  Service: Plastics;  Laterality: N/A;  . COLONOSCOPY N/A 08/12/2016   Procedure: COLONOSCOPY;  Surgeon: Otis Brace, MD;  Location: [facility];  Service: Gastroenterology;  Laterality: N/A;  . ESOPHAGOGASTRODUODENOSCOPY N/A 08/12/2016   Procedure: ESOPHAGOGASTRODUODENOSCOPY (EGD);  Surgeon: Otis Brace, MD;  Location: [facility];  Service: Gastroenterology;  Laterality: N/A;  . INSERTION OF DIALYSIS CATHETER N/A 08/07/2016   Procedure: INSERTION OF TUNNELED DIALYSIS CATHETER;  Surgeon: Angelia Mould, MD;  Location: [facility];  Service: Vascular;  Laterality: N/A;  . LIGATION OF ARTERIOVENOUS  FISTULA Left 09/12/2016   Procedure: BANDING OF LEFT  ARTERIOVENOUS  FISTULA;  Surgeon: Angelia Mould, MD;  Location: [facility];  Service: Vascular;  Laterality: Left;  . LOWER EXTREMITY INTERVENTION Right 12/02/2018   Procedure: LOWER EXTREMITY INTERVENTION;  Surgeon: Algernon Huxley, MD;  Location: [facility];  Service: Cardiovascular;  Laterality: Right;    Prior to Admission medications   Medication Sig Start Date End Date Taking? Authorizing Provider  calcium acetate (PHOSLO) 667 MG capsule Take 2 capsules (1,334 mg total) by mouth 3 (three) times daily with meals. Patient taking differently: Take 2,001 mg by mouth 3 (three) times daily with meals.  02/12/19   Fritzi Mandes, MD  camphor-menthol [facility]) lotion Apply 1 application topically as needed for itching. 11/23/19   Charlann Lange, PA-C  hydrOXYzine (ATARAX/VISTARIL) 50 MG tablet Take 1 tablet (50 mg total) by mouth 3 (three) times daily as needed for itching. 04/11/20   Sable Feil, PA-C  OLANZapine (ZYPREXA) 2.5 MG tablet Take 1 tablet (2.5 mg total) by mouth at bedtime. 04/10/20   Candee Furbish, MD  patiromer (VELTASSA) 8.4 g packet Take 1 packet (8.4 g total) by mouth daily. 03/14/20   Lorella Nimrod, MD  sevelamer  carbonate (RENVELA) 800 MG tablet Take 800 mg by mouth 3 (three) times daily with meals.    [provider]    Allergies Patient has no known allergies.  Family History  Problem Relation Age of Onset  . Diabetes Mother   . Kidney failure Mother   . Healthy Father   . Kidney failure Brother   . Healthy Sister   . Kidney disease Daughter   . Post-traumatic stress disorder Neg Hx   . Bladder Cancer Neg Hx   . Kidney cancer Neg Hx     Social History Social History   Tobacco Use  . Smoking status: Current Every Day Smoker    Packs/day: 0.50    Years: 40.00    Pack years: 20.00    Types: Cigarettes  . Smokeless tobacco: Never Used  Vaping Use  . Vaping Use: Never used  Substance Use Topics  . Alcohol use: Yes    Alcohol/week: 21.0 standard drinks    Types: 21 Cans of beer per week    Comment: none since 08/04/16  . Drug use: No    Review of Systems  Constitutional: No fever/chills Eyes: No visual changes. ENT: No sore throat. Cardiovascular: Denies chest pain. Respiratory: Denies productive cough Gastrointestinal: No abdominal pain.  No nausea, no vomiting.  No diarrhea.  No constipation. Genitourinary:  Negative for dysuria. Musculoskeletal: Negative for back pain. Skin: Negative for rash.  But positive for itching sensation. Neurological: Negative for headaches, focal weakness or numbness.   ____________________________________________   PHYSICAL EXAM:  VITAL SIGNS: ED Triage Vitals  Enc Vitals Group     BP 04/11/20 0752 (!) 168/92     Pulse Rate 04/11/20 0752 88     Resp 04/11/20 0752 18     Temp 04/11/20 0752 98 F (36.7 C)     Temp Source 04/11/20 0752 Oral     SpO2 04/11/20 0752 96 %     Weight 04/11/20 0753 160 lb (72.6 kg)     Height 04/11/20 0753 5\' 4"  (1.626 m)     Head Circumference --      Peak Flow --      Pain Score 04/11/20 0752 10     Pain Loc --      Pain Edu? --      Excl. in [place]? --      Constitutional: Alert and  oriented. Well appearing and in no acute distress.  Sitting up in bed and conversational in full sentences.  Joking.  Complaining of itching sensation frequently.  With questioning, will complain of shortness of breath. Eyes: Conjunctivae are normal. PERRL. EOMI. Head: Atraumatic. Nose: No congestion/rhinnorhea. Mouth/Throat: Mucous membranes are moist.  Oropharynx non-erythematous. Neck: No stridor. No cervical spine tenderness to palpation. Cardiovascular: Normal rate, regular rhythm. Grossly normal heart sounds.  Good peripheral circulation. Respiratory: Normal respiratory effort.  No retractions.  Minimal bibasilar crackles, otherwise clear to auscultation. Gastrointestinal: Soft , nondistended, nontender to palpation. No abdominal bruits. No CVA tenderness. Musculoskeletal: No lower extremity tenderness nor edema.  No joint effusions. No signs of acute trauma. Proximal LUE AV fistula in place, distally neurovascularly intact. Neurologic:  Normal speech and language. No gross focal neurologic deficits are appreciated. No gait instability noted. Skin:  Skin is warm, dry and intact. No rash noted. Psychiatric: Mood and affect are normal. Speech and behavior are normal.  ____________________________________________   LABS (all labs ordered are listed, but only abnormal results are displayed)  Labs Reviewed  CBC WITH DIFFERENTIAL/PLATELET - Abnormal; Notable for the following components:      Result Value   RBC 2.58 (*)    Hemoglobin 8.2 (*)    HCT 26.3 (*)    MCV 101.9 (*)    RDW 18.6 (*)    Neutro Abs 7.8 (*)    Monocytes Absolute 1.3 (*)    Abs Immature Granulocytes 0.09 (*)    All other components within normal limits  COMPREHENSIVE METABOLIC PANEL - Abnormal; Notable for the following components:   BUN 56 (*)    Creatinine, Ser 7.41 (*)    Total Protein 8.5 (*)    Total Bilirubin 1.4 (*)    GFR calc non Af Amer 8 (*)    GFR calc Af Amer 9 (*)    All other components within  normal limits   ____________________________________________  12 Lead EKG Sinus rhythm rate of 94 bpm, normal axis and intervals.  No evidence of acute ischemia.  No PT wave to suggest severe hyperkalemia.  ____________________________________________  RADIOLOGY  ED MD interpretation: CXR obtained concern for pleural effusions or significant pulmonary edema in the setting of his shortness of breath and missed dialysis sessions.  Results reviewed and remarkable for mild pulmonary vascular congestion without effusion.  Official radiology report(s): DG Chest Portable 1 View  Result Date: 04/11/2020 CLINICAL DATA:  Shortness of  breath, missed dialysis EXAM: PORTABLE CHEST 1 VIEW COMPARISON:  04/07/2020 FINDINGS: Lines and tubes are no longer present. Low lung volumes. Pulmonary vascular congestion with probable mild edema. No pleural effusions. No pneumothorax. Top normal heart size. IMPRESSION: Pulmonary vascular congestion and probable mild edema. Electronically Signed   By: Macy Mis M.D.   On: 04/11/2020 09:41    ____________________________________________   PROCEDURES  Procedure(s) performed (including Critical Care):  Procedures   ____________________________________________   INITIAL IMPRESSION / ASSESSMENT AND PLAN / ED COURSE  54 year old patient with ESRD presenting from fast track, where he was initially seen for pruritus and appropriately treated with hydroxyzine, without evidence of emergent need for dialysis and able to be transferred to his outpatient unit for regularly scheduled dialysis.  We discussed the case with nephrology, they are well aware of him by name and able to fit him in to outpatient dialysis this afternoon.  Patient has no needs for emergent dialysis in the hospital today, no significant hyperkalemia or fluid overload.  His complaints of itchiness improves after providing Seroquel, as recommended by inpatient psychiatry last week.  Urged patient to  continue to follow-up with his dialysis and other outpatient visits, medically stable for transfer to his outpatient dialysis unit.  Clinical Course as of Apr 11 1616  Wed Apr 11, 2020  3710 Chart review of psychiatry notes indicates patient is was to be taking Seroquel twice daily, 1 tablet ordered here.   [DS]  1047 Spoke with nephrologist on-call, Dr. Juleen China, who was able to arrange an outpatient dialysis slot for later this morning.  Spoke with RN Tamala Julian to arrange Transport to patient's dialysis   [DS]    Clinical Course User Index [DS] Vladimir Crofts, MD     ____________________________________________   FINAL CLINICAL IMPRESSION(S) / ED DIAGNOSES  Final diagnoses:  Pruritus  ESRD (end stage renal disease) on dialysis [facility])     ED Discharge Orders         Ordered    hydrOXYzine (ATARAX/VISTARIL) 50 MG tablet  3 times daily PRN     Discontinue  Reprint     04/11/20 0818           Sharni Negron Tamala Julian   Note:  This document was prepared using Dragon voice recognition software and may include unintentional dictation errors.   Vladimir Crofts, MD 04/11/20 [phone]

## 2020-04-11 NOTE — TOC Initial Note (Signed)
Transition of Care [facility]) - Initial/Assessment Note    Patient Details  Name: RANKIN COOLMAN MRN: [id number] Date of Birth: [birth year]  Transition of Care [facility]) CM/SW Contact:    Anselm Pancoast, RN Phone Number: 04/11/2020, 10:55 AM  Clinical Narrative:                 Spoke with Elvera Bicker, Dialysis Navigator regarding patient needing OP Dialysis. Estill Bamberg states she will outreach to Dialysis center and update on plan.         Patient Goals and CMS Choice        Expected Discharge Plan and Services                                                Prior Living Arrangements/Services                       Activities of Daily Living      Permission Sought/Granted                  Emotional Assessment              Admission diagnosis:  allergic reaction Patient Active Problem List   Diagnosis Date Noted  . Depression 04/09/2020  . Current mild episode of major depressive disorder without prior episode ([place]) 04/09/2020  . Atrial fibrillation with RVR ([place]) 04/04/2020  . Acute on chronic systolic CHF (congestive heart failure) ([place]) 04/04/2020  . Non-compliance with renal dialysis ([place]) 03/21/2020  . Acute CHF (congestive heart failure) ([place]) 03/12/2020  . 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 ([place]) 02/03/2020  . Acute on chronic combined systolic and diastolic CHF (congestive heart failure) ([place]) 02/03/2020  . Pulmonary edema 01/31/2020  . Syncopal episodes 01/24/2020  . Macrocytic anemia 01/24/2020  . Hyperkalemia 01/24/2020  . Fluid overload 11/12/2019  . Acute respiratory failure with hypoxia ([place]) 11/12/2019  . Aortic atherosclerosis ([place]) 11/12/2019  . Emphysema of lung ([place]) 11/12/2019  . Acute pulmonary edema ([place]) 10/31/2019  . Acute on chronic congestive heart failure ([place]) 10/22/2019  . Acute respiratory distress 10/06/2019  . Volume overload 10/05/2019  . Hypokalemia 10/05/2019  . ESRD (end stage renal  disease) ([place]) 10/01/2019  . Leg mass, right   . Hyperbilirubinemia   . Symptomatic anemia 09/19/2019  . Pruritus 09/19/2019  . Hemoptysis 02/11/2019  . Hypercalcemia 01/19/2019  . Elevated troponin 05/17/2018  . Anemia in ESRD (end-stage renal disease) ([place]) 05/17/2018  . Hepatitis B 03/19/2018  . ESRD on dialysis ([place]) 05/26/2017  . Essential hypertension 05/26/2017  . Urinary tract infection, site not specified 10/17/2016  . Coagulation defect, unspecified ([place]) 08/13/2016  . Diarrhea, unspecified 08/13/2016  . Encounter for immunization 08/13/2016  . Headache, unspecified 08/13/2016  . Hypertensive chronic kidney disease with stage 5 chronic kidney disease or end stage renal disease ([place]) 08/13/2016  . Iron deficiency anemia, unspecified 08/13/2016  . Pain, unspecified 08/13/2016  . Moderate protein-calorie malnutrition ([place]) 08/13/2016  . Secondary hyperparathyroidism of renal origin ([place]) 08/13/2016   PCP:  Jearld Fenton, NP Pharmacy:   [facility] [address] [place] [address] Phone: [phone] Fax: [phone]  [facility] [place], [place] - [address] [place], [address] [place] [address] Phone: [phone]  Fax: [phone]     Social Determinants of Health (SDOH) Interventions    Readmission Risk Interventions No flowsheet data found.

## 2020-04-11 NOTE — ED Provider Notes (Addendum)
[facility] Emergency Department Provider Note   ____________________________________________   First MD Initiated Contact with Patient 04/11/20 [phone]     (approximate)  I have reviewed the triage vital signs and the nursing notes.   HISTORY  Chief Complaint Rash    HPI Jordan [name] is a 54 y.o. male patient complain of diffuse itching for 2 days.  Patient state he missed dialysis last week and when he had a session on Monday this week he was kept for observation.  Patient states he was released to go home and he noticed itching.  Patient states he might have been bitten by an insect.      Past Medical History:  Diagnosis Date  . Anemia   . Aortic atherosclerosis ([place]) 11/12/2019  . CKD (chronic kidney disease)    Stage 5  Dialysis - M/W/F in [place], [place]  . Dyspnea    tx with inhaler when sick  . ED (erectile dysfunction)   . Emphysema of lung ([place]) 11/12/2019  . ETOH abuse   . History of blood transfusion   . Hypertension   . Wears dentures     Patient Active Problem List   Diagnosis Date Noted  . Depression 04/09/2020  . Current mild episode of major depressive disorder without prior episode ([place]) 04/09/2020  . Atrial fibrillation with RVR ([place]) 04/04/2020  . Acute on chronic systolic CHF (congestive heart failure) ([place]) 04/04/2020  . Non-compliance with renal dialysis ([place]) 03/21/2020  . Acute CHF (congestive heart failure) ([place]) 03/12/2020  . 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 Acute on chronic combined systolic and diastolic CHF (congestive heart failure) ([place]) 02/03/2020  . Pulmonary edema 01/31/2020  . Syncopal episodes 01/24/2020  . Macrocytic anemia 01/24/2020  . Hyperkalemia 01/24/2020  . Fluid overload 11/12/2019  . Acute respiratory failure with hypoxia ([place]) 11/12/2019  . Aortic atherosclerosis ([place]) 11/12/2019  . Emphysema of lung ([place]) 11/12/2019  . Acute pulmonary edema ([place])  10/31/2019  . Acute on chronic congestive heart failure ([place]) 10/22/2019  . Acute respiratory distress 10/06/2019  . Volume overload 10/05/2019  . Hypokalemia 10/05/2019  . ESRD (end stage renal disease) ([place]) 10/01/2019  . Leg mass, right   . Hyperbilirubinemia   . Symptomatic anemia 09/19/2019  . Pruritus 09/19/2019  . Hemoptysis 02/11/2019  . Hypercalcemia 01/19/2019  . Elevated troponin 05/17/2018  . Anemia in ESRD (end-stage renal disease) ([place]) 05/17/2018  . Hepatitis B 03/19/2018  . ESRD on dialysis ([place]) 05/26/2017  . Essential hypertension 05/26/2017  . Urinary tract infection, site not specified 10/17/2016  . Coagulation defect, unspecified ([place]) 08/13/2016  . Diarrhea, unspecified 08/13/2016  . Encounter for immunization 08/13/2016  . Headache, unspecified 08/13/2016  . Hypertensive chronic kidney disease with stage 5 chronic kidney disease or end stage renal disease ([place]) 08/13/2016  . Iron deficiency anemia, unspecified 08/13/2016  . Pain, unspecified 08/13/2016  . Moderate protein-calorie malnutrition ([place]) 08/13/2016  . Secondary hyperparathyroidism of renal origin ([place]) 08/13/2016    Past Surgical History:  Procedure Laterality Date  . BASCILIC VEIN TRANSPOSITION Left 08/07/2016   Procedure: LEFT BASILIC VEIN TRANSPOSITION;  Surgeon: Angelia Mould, MD;  Location: [facility];  Service: Vascular;  Laterality: Left;  . CLOSED REDUCTION NASAL FRACTURE N/A 08/11/2019   Procedure: CLOSED REDUCTION NASAL FRACTURE WITH STABILIZATION;  Surgeon: Irene Limbo, MD;  Location: [facility];  Service: Plastics;  Laterality: N/A;  . COLONOSCOPY N/A 08/12/2016   Procedure: COLONOSCOPY;  Surgeon:  Otis Brace, MD;  Location: [facility];  Service: Gastroenterology;  Laterality: N/A;  . ESOPHAGOGASTRODUODENOSCOPY N/A 08/12/2016   Procedure: ESOPHAGOGASTRODUODENOSCOPY (EGD);  Surgeon: Otis Brace, MD;  Location: [facility];  Service: Gastroenterology;  Laterality: N/A;   . INSERTION OF DIALYSIS CATHETER N/A 08/07/2016   Procedure: INSERTION OF TUNNELED DIALYSIS CATHETER;  Surgeon: Angelia Mould, MD;  Location: [facility];  Service: Vascular;  Laterality: N/A;  . LIGATION OF ARTERIOVENOUS  FISTULA Left 09/12/2016   Procedure: BANDING OF LEFT  ARTERIOVENOUS  FISTULA;  Surgeon: Angelia Mould, MD;  Location: [facility];  Service: Vascular;  Laterality: Left;  . LOWER EXTREMITY INTERVENTION Right 12/02/2018   Procedure: LOWER EXTREMITY INTERVENTION;  Surgeon: Algernon Huxley, MD;  Location: [facility];  Service: Cardiovascular;  Laterality: Right;    Prior to Admission medications   Medication Sig Start Date End Date Taking? Authorizing Provider  calcium acetate (PHOSLO) 667 MG capsule Take 2 capsules (1,334 mg total) by mouth 3 (three) times daily with meals. Patient taking differently: Take 2,001 mg by mouth 3 (three) times daily with meals.  02/12/19   Fritzi Mandes, MD  camphor-menthol [facility]) lotion Apply 1 application topically as needed for itching. 11/23/19   Charlann Lange, PA-C  hydrOXYzine (ATARAX/VISTARIL) 50 MG tablet Take 1 tablet (50 mg total) by mouth 3 (three) times daily as needed for itching. 04/11/20   Sable Feil, PA-C  OLANZapine (ZYPREXA) 2.5 MG tablet Take 1 tablet (2.5 mg total) by mouth at bedtime. 04/10/20   Candee Furbish, MD  patiromer (VELTASSA) 8.4 g packet Take 1 packet (8.4 g total) by mouth daily. 03/14/20   Lorella Nimrod, MD  sevelamer carbonate (RENVELA) 800 MG tablet Take 800 mg by mouth 3 (three) times daily with meals.    [provider]    Allergies Patient has no known allergies.  Family History  Problem Relation Age of Onset  . Diabetes Mother   . Kidney failure Mother   . Healthy Father   . Kidney failure Brother   . Healthy Sister   . Kidney disease Daughter   . Post-traumatic stress disorder Neg Hx   . Bladder Cancer Neg Hx   . Kidney cancer Neg Hx     Social History Social History    Tobacco Use  . Smoking status: Current Every Day Smoker    Packs/day: 0.50    Years: 40.00    Pack years: 20.00    Types: Cigarettes  . Smokeless tobacco: Never Used  Vaping Use  . Vaping Use: Never used  Substance Use Topics  . Alcohol use: Yes    Alcohol/week: 21.0 standard drinks    Types: 21 Cans of beer per week    Comment: none since 08/04/16  . Drug use: No    Review of Systems Constitutional: No fever/chills Eyes: No visual changes. ENT: No sore throat. Cardiovascular: Denies chest pain. Respiratory: Denies shortness of breath. Gastrointestinal: No abdominal pain.  No nausea, no vomiting.  No diarrhea.  No constipation. Genitourinary: Negative for dysuria. Musculoskeletal: Negative for back pain. Skin: Negative for rash.  Pruritus Neurological: Negative for headaches, focal weakness or numbness. {**Psychiatric:  Endocrine: CKD,  Diabetes and hypertension   ____________________________________________   PHYSICAL EXAM:  VITAL SIGNS: ED Triage Vitals  Enc Vitals Group     BP 04/11/20 0752 (!) 168/92     Pulse Rate 04/11/20 0752 88     Resp 04/11/20 0752 18     Temp 04/11/20  0752 98 F (36.7 C)     Temp Source 04/11/20 0752 Oral     SpO2 04/11/20 0752 96 %     Weight 04/11/20 0753 160 lb (72.6 kg)     Height 04/11/20 0753 5\' 4"  (1.626 m)     Head Circumference --      Peak Flow --      Pain Score 04/11/20 0752 10     Pain Loc --      Pain Edu? --      Excl. in [place]? --     Constitutional: Alert and oriented. Well appearing and in no acute distress. Cardiovascular: Normal rate, regular rhythm. Grossly normal heart sounds.  Good peripheral circulation. Respiratory: Normal respiratory effort.  No retractions. Lungs CTAB. Musculoskeletal: No lower extremity tenderness nor edema.  No joint effusions. Neurologic:  Normal speech and language. No gross focal neurologic deficits are appreciated. No gait instability. Skin:  Skin is warm, dry and intact. No  rash noted. Psychiatric: Mood and affect are normal. Speech and behavior are normal.  ____________________________________________   LABS (all labs ordered are listed, but only abnormal results are displayed)  Labs Reviewed  CBC WITH DIFFERENTIAL/PLATELET - Abnormal; Notable for the following components:      Result Value   RBC 2.58 (*)    Hemoglobin 8.2 (*)    HCT 26.3 (*)    MCV 101.9 (*)    RDW 18.6 (*)    Neutro Abs 7.8 (*)    Monocytes Absolute 1.3 (*)    Abs Immature Granulocytes 0.09 (*)    All other components within normal limits  COMPREHENSIVE METABOLIC PANEL - Abnormal; Notable for the following components:   BUN 56 (*)    Creatinine, Ser 7.41 (*)    Total Protein 8.5 (*)    Total Bilirubin 1.4 (*)    GFR calc non Af Amer 8 (*)    GFR calc Af Amer 9 (*)    All other components within normal limits   ____________________________________________  EKG   ____________________________________________  RADIOLOGY  ED MD interpretation:    Official radiology report(s): DG Chest Portable 1 View  Result Date: 04/11/2020 CLINICAL DATA:  Shortness of breath, missed dialysis EXAM: PORTABLE CHEST 1 VIEW COMPARISON:  04/07/2020 FINDINGS: Lines and tubes are no longer present. Low lung volumes. Pulmonary vascular congestion with probable mild edema. No pleural effusions. No pneumothorax. Top normal heart size. IMPRESSION: Pulmonary vascular congestion and probable mild edema. Electronically Signed   By: Macy Mis M.D.   On: 04/11/2020 09:41    ____________________________________________   PROCEDURES  Procedure(s) performed (including Critical Care):  Procedures   ____________________________________________   INITIAL IMPRESSION / ASSESSMENT AND PLAN / ED COURSE  As part of my medical decision making, I reviewed the following data within the [facility]     Patient complains of 2 days of diffuse itching.  Patient complaint physical exam  consistent with colitis.  Patient given prescription for Atarax and advised follow-up PCP.    Jordan [name] was evaluated in Emergency Department on 04/11/2020 for the symptoms described in the history of present illness. He was evaluated in the context of the global COVID-19 pandemic, which necessitated consideration that the patient might be at risk for infection with the SARS-CoV-2 virus that causes COVID-19. Institutional protocols and algorithms that pertain to the evaluation of patients at risk for COVID-19 are in a state of rapid change based on information released by regulatory bodies including the  CDC and federal and [employer]. These policies and algorithms were followed during the patient's care in the ED.   Clinical Course as of Apr 11 1636  Wed Apr 11, 2020  8657 Chart review of psychiatry notes indicates patient is was to be taking Seroquel twice daily, 1 tablet ordered here.   [DS]  1047 Spoke with nephrologist on-call, Dr. Juleen China, who was able to arrange an outpatient dialysis slot for later this morning.  Spoke with RN Tamala Julian to arrange Transport to patient's dialysis   [DS]    Clinical Course User Index [DS] Vladimir Crofts, MD     ____________________________________________   FINAL CLINICAL IMPRESSION(S) / ED DIAGNOSES  Final diagnoses:  Pruritus  ESRD (end stage renal disease) on dialysis [facility])     ED Discharge Orders         Ordered    hydrOXYzine (ATARAX/VISTARIL) 50 MG tablet  3 times daily PRN     Discontinue  Reprint     04/11/20 0818           Note:  This document was prepared using Dragon voice recognition software and may include unintentional dictation errors.    Sable Feil, PA-C 04/11/20 8469    Lavonia Drafts, MD 04/11/20 6295    Vladimir Crofts, MD 04/11/20 [phone]

## 2020-04-12 ENCOUNTER — Telehealth: Payer: Self-pay | Admitting: Nephrology

## 2020-04-12 DIAGNOSIS — D631 Anemia in chronic kidney disease: Secondary | ICD-10-CM | POA: Diagnosis not present

## 2020-04-12 DIAGNOSIS — N2581 Secondary hyperparathyroidism of renal origin: Secondary | ICD-10-CM | POA: Diagnosis not present

## 2020-04-12 DIAGNOSIS — E8770 Fluid overload, unspecified: Secondary | ICD-10-CM | POA: Diagnosis not present

## 2020-04-12 DIAGNOSIS — Z992 Dependence on renal dialysis: Secondary | ICD-10-CM | POA: Diagnosis not present

## 2020-04-12 DIAGNOSIS — N186 End stage renal disease: Secondary | ICD-10-CM | POA: Diagnosis not present

## 2020-04-12 NOTE — Telephone Encounter (Signed)
Transition of care contact from inpatient facility  Date of Discharge: 04/10/20 Date of Contact: 7/222/21 -2nd attempt  Method of contact: Phone  Attempted to contact patient to discuss transition of care from inpatient admission. Patient did not answer the phone -number not in service.

## 2020-04-16 DIAGNOSIS — E8770 Fluid overload, unspecified: Secondary | ICD-10-CM | POA: Diagnosis not present

## 2020-04-16 DIAGNOSIS — N2581 Secondary hyperparathyroidism of renal origin: Secondary | ICD-10-CM | POA: Diagnosis not present

## 2020-04-16 DIAGNOSIS — D631 Anemia in chronic kidney disease: Secondary | ICD-10-CM | POA: Diagnosis not present

## 2020-04-16 DIAGNOSIS — Z992 Dependence on renal dialysis: Secondary | ICD-10-CM | POA: Diagnosis not present

## 2020-04-16 DIAGNOSIS — N186 End stage renal disease: Secondary | ICD-10-CM | POA: Diagnosis not present

## 2020-04-16 NOTE — Progress Notes (Deleted)
   Patient ID: Jordan [name], male    DOB: 1966/05/11, 53 y.o.   MRN: [id number]  HPI  Mr Guyton is a 54 y/o male with a history of  Echo report from 04/05/20 reviewed and showed an EF of 45-50% along with moderate LVH and moderately elevated PA Pressure of 53.7 mmHg.  Review of Systems    Physical Exam  Assessment & Plan:  1: Mildly reduced ejection fraction- - NYHA class  2:

## 2020-04-17 ENCOUNTER — Ambulatory Visit: Payer: Medicare Other | Admitting: Family

## 2020-04-17 ENCOUNTER — Telehealth: Payer: Self-pay | Admitting: Family

## 2020-04-17 NOTE — Telephone Encounter (Signed)
Patient did not show for his [facility] Failure [facility] appointment on 04/17/20. Will attempt to reschedule.

## 2020-04-18 DIAGNOSIS — E8770 Fluid overload, unspecified: Secondary | ICD-10-CM | POA: Diagnosis not present

## 2020-04-18 DIAGNOSIS — D631 Anemia in chronic kidney disease: Secondary | ICD-10-CM | POA: Diagnosis not present

## 2020-04-18 DIAGNOSIS — N186 End stage renal disease: Secondary | ICD-10-CM | POA: Diagnosis not present

## 2020-04-18 DIAGNOSIS — Z992 Dependence on renal dialysis: Secondary | ICD-10-CM | POA: Diagnosis not present

## 2020-04-18 DIAGNOSIS — N2581 Secondary hyperparathyroidism of renal origin: Secondary | ICD-10-CM | POA: Diagnosis not present

## 2020-04-20 DIAGNOSIS — D631 Anemia in chronic kidney disease: Secondary | ICD-10-CM | POA: Diagnosis not present

## 2020-04-20 DIAGNOSIS — N186 End stage renal disease: Secondary | ICD-10-CM | POA: Diagnosis not present

## 2020-04-20 DIAGNOSIS — E8770 Fluid overload, unspecified: Secondary | ICD-10-CM | POA: Diagnosis not present

## 2020-04-20 DIAGNOSIS — Z992 Dependence on renal dialysis: Secondary | ICD-10-CM | POA: Diagnosis not present

## 2020-04-20 DIAGNOSIS — N2581 Secondary hyperparathyroidism of renal origin: Secondary | ICD-10-CM | POA: Diagnosis not present

## 2020-04-21 DIAGNOSIS — I129 Hypertensive chronic kidney disease with stage 1 through stage 4 chronic kidney disease, or unspecified chronic kidney disease: Secondary | ICD-10-CM | POA: Diagnosis not present

## 2020-04-21 DIAGNOSIS — Z992 Dependence on renal dialysis: Secondary | ICD-10-CM | POA: Diagnosis not present

## 2020-04-21 DIAGNOSIS — N186 End stage renal disease: Secondary | ICD-10-CM | POA: Diagnosis not present

## 2020-04-23 DIAGNOSIS — N186 End stage renal disease: Secondary | ICD-10-CM | POA: Diagnosis not present

## 2020-04-23 DIAGNOSIS — N2581 Secondary hyperparathyroidism of renal origin: Secondary | ICD-10-CM | POA: Diagnosis not present

## 2020-04-23 DIAGNOSIS — Z992 Dependence on renal dialysis: Secondary | ICD-10-CM | POA: Diagnosis not present

## 2020-04-23 DIAGNOSIS — L299 Pruritus, unspecified: Secondary | ICD-10-CM | POA: Diagnosis not present

## 2020-04-25 DIAGNOSIS — L299 Pruritus, unspecified: Secondary | ICD-10-CM | POA: Diagnosis not present

## 2020-04-25 DIAGNOSIS — Z992 Dependence on renal dialysis: Secondary | ICD-10-CM | POA: Diagnosis not present

## 2020-04-25 DIAGNOSIS — N2581 Secondary hyperparathyroidism of renal origin: Secondary | ICD-10-CM | POA: Diagnosis not present

## 2020-04-25 DIAGNOSIS — N186 End stage renal disease: Secondary | ICD-10-CM | POA: Diagnosis not present

## 2020-04-27 DIAGNOSIS — L299 Pruritus, unspecified: Secondary | ICD-10-CM | POA: Diagnosis not present

## 2020-04-27 DIAGNOSIS — N186 End stage renal disease: Secondary | ICD-10-CM | POA: Diagnosis not present

## 2020-04-27 DIAGNOSIS — N2581 Secondary hyperparathyroidism of renal origin: Secondary | ICD-10-CM | POA: Diagnosis not present

## 2020-04-27 DIAGNOSIS — Z992 Dependence on renal dialysis: Secondary | ICD-10-CM | POA: Diagnosis not present

## 2020-04-30 DIAGNOSIS — Z992 Dependence on renal dialysis: Secondary | ICD-10-CM | POA: Diagnosis not present

## 2020-04-30 DIAGNOSIS — N186 End stage renal disease: Secondary | ICD-10-CM | POA: Diagnosis not present

## 2020-04-30 DIAGNOSIS — L299 Pruritus, unspecified: Secondary | ICD-10-CM | POA: Diagnosis not present

## 2020-04-30 DIAGNOSIS — N2581 Secondary hyperparathyroidism of renal origin: Secondary | ICD-10-CM | POA: Diagnosis not present

## 2020-05-04 DIAGNOSIS — L299 Pruritus, unspecified: Secondary | ICD-10-CM | POA: Diagnosis not present

## 2020-05-04 DIAGNOSIS — N186 End stage renal disease: Secondary | ICD-10-CM | POA: Diagnosis not present

## 2020-05-04 DIAGNOSIS — Z992 Dependence on renal dialysis: Secondary | ICD-10-CM | POA: Diagnosis not present

## 2020-05-04 DIAGNOSIS — N2581 Secondary hyperparathyroidism of renal origin: Secondary | ICD-10-CM | POA: Diagnosis not present

## 2020-05-09 DIAGNOSIS — L299 Pruritus, unspecified: Secondary | ICD-10-CM | POA: Diagnosis not present

## 2020-05-09 DIAGNOSIS — Z992 Dependence on renal dialysis: Secondary | ICD-10-CM | POA: Diagnosis not present

## 2020-05-09 DIAGNOSIS — N186 End stage renal disease: Secondary | ICD-10-CM | POA: Diagnosis not present

## 2020-05-09 DIAGNOSIS — N2581 Secondary hyperparathyroidism of renal origin: Secondary | ICD-10-CM | POA: Diagnosis not present

## 2020-05-11 DIAGNOSIS — L299 Pruritus, unspecified: Secondary | ICD-10-CM | POA: Diagnosis not present

## 2020-05-11 DIAGNOSIS — N2581 Secondary hyperparathyroidism of renal origin: Secondary | ICD-10-CM | POA: Diagnosis not present

## 2020-05-11 DIAGNOSIS — N186 End stage renal disease: Secondary | ICD-10-CM | POA: Diagnosis not present

## 2020-05-11 DIAGNOSIS — Z992 Dependence on renal dialysis: Secondary | ICD-10-CM | POA: Diagnosis not present

## 2020-05-14 DIAGNOSIS — N2581 Secondary hyperparathyroidism of renal origin: Secondary | ICD-10-CM | POA: Diagnosis not present

## 2020-05-14 DIAGNOSIS — N186 End stage renal disease: Secondary | ICD-10-CM | POA: Diagnosis not present

## 2020-05-14 DIAGNOSIS — Z992 Dependence on renal dialysis: Secondary | ICD-10-CM | POA: Diagnosis not present

## 2020-05-14 DIAGNOSIS — L299 Pruritus, unspecified: Secondary | ICD-10-CM | POA: Diagnosis not present

## 2020-05-21 DIAGNOSIS — N2581 Secondary hyperparathyroidism of renal origin: Secondary | ICD-10-CM | POA: Diagnosis not present

## 2020-05-21 DIAGNOSIS — L299 Pruritus, unspecified: Secondary | ICD-10-CM | POA: Diagnosis not present

## 2020-05-21 DIAGNOSIS — N186 End stage renal disease: Secondary | ICD-10-CM | POA: Diagnosis not present

## 2020-05-21 DIAGNOSIS — Z992 Dependence on renal dialysis: Secondary | ICD-10-CM | POA: Diagnosis not present

## 2020-05-22 DIAGNOSIS — I129 Hypertensive chronic kidney disease with stage 1 through stage 4 chronic kidney disease, or unspecified chronic kidney disease: Secondary | ICD-10-CM | POA: Diagnosis not present

## 2020-05-22 DIAGNOSIS — N186 End stage renal disease: Secondary | ICD-10-CM | POA: Diagnosis not present

## 2020-05-22 DIAGNOSIS — Z992 Dependence on renal dialysis: Secondary | ICD-10-CM | POA: Diagnosis not present

## 2020-05-25 DIAGNOSIS — N186 End stage renal disease: Secondary | ICD-10-CM | POA: Diagnosis not present

## 2020-05-25 DIAGNOSIS — Z992 Dependence on renal dialysis: Secondary | ICD-10-CM | POA: Diagnosis not present

## 2020-05-25 DIAGNOSIS — D509 Iron deficiency anemia, unspecified: Secondary | ICD-10-CM | POA: Diagnosis not present

## 2020-05-25 DIAGNOSIS — R52 Pain, unspecified: Secondary | ICD-10-CM | POA: Diagnosis not present

## 2020-05-25 DIAGNOSIS — L299 Pruritus, unspecified: Secondary | ICD-10-CM | POA: Diagnosis not present

## 2020-05-25 DIAGNOSIS — N2581 Secondary hyperparathyroidism of renal origin: Secondary | ICD-10-CM | POA: Diagnosis not present

## 2020-05-28 DIAGNOSIS — D509 Iron deficiency anemia, unspecified: Secondary | ICD-10-CM | POA: Diagnosis not present

## 2020-05-28 DIAGNOSIS — Z992 Dependence on renal dialysis: Secondary | ICD-10-CM | POA: Diagnosis not present

## 2020-05-28 DIAGNOSIS — N2581 Secondary hyperparathyroidism of renal origin: Secondary | ICD-10-CM | POA: Diagnosis not present

## 2020-05-28 DIAGNOSIS — L299 Pruritus, unspecified: Secondary | ICD-10-CM | POA: Diagnosis not present

## 2020-05-28 DIAGNOSIS — N186 End stage renal disease: Secondary | ICD-10-CM | POA: Diagnosis not present

## 2020-05-28 DIAGNOSIS — R52 Pain, unspecified: Secondary | ICD-10-CM | POA: Diagnosis not present

## 2020-06-01 DIAGNOSIS — N186 End stage renal disease: Secondary | ICD-10-CM | POA: Diagnosis not present

## 2020-06-01 DIAGNOSIS — D509 Iron deficiency anemia, unspecified: Secondary | ICD-10-CM | POA: Diagnosis not present

## 2020-06-01 DIAGNOSIS — Z992 Dependence on renal dialysis: Secondary | ICD-10-CM | POA: Diagnosis not present

## 2020-06-01 DIAGNOSIS — N2581 Secondary hyperparathyroidism of renal origin: Secondary | ICD-10-CM | POA: Diagnosis not present

## 2020-06-01 DIAGNOSIS — L299 Pruritus, unspecified: Secondary | ICD-10-CM | POA: Diagnosis not present

## 2020-06-01 DIAGNOSIS — R52 Pain, unspecified: Secondary | ICD-10-CM | POA: Diagnosis not present

## 2020-06-04 ENCOUNTER — Emergency Department
Admission: EM | Admit: 2020-06-04 | Discharge: 2020-06-04 | Discharge status: Against Medical Advice | Payer: Medicare Other | Attending: Emergency Medicine | Admitting: Emergency Medicine

## 2020-06-04 ENCOUNTER — Other Ambulatory Visit: Payer: Self-pay

## 2020-06-04 ENCOUNTER — Emergency Department: Payer: Medicare Other

## 2020-06-04 DIAGNOSIS — I132 Hypertensive heart and chronic kidney disease with heart failure and with stage 5 chronic kidney disease, or end stage renal disease: Secondary | ICD-10-CM | POA: Diagnosis not present

## 2020-06-04 DIAGNOSIS — Z992 Dependence on renal dialysis: Secondary | ICD-10-CM | POA: Insufficient documentation

## 2020-06-04 DIAGNOSIS — I5041 Acute combined systolic (congestive) and diastolic (congestive) heart failure: Secondary | ICD-10-CM | POA: Diagnosis not present

## 2020-06-04 DIAGNOSIS — R0602 Shortness of breath: Secondary | ICD-10-CM

## 2020-06-04 DIAGNOSIS — F17210 Nicotine dependence, cigarettes, uncomplicated: Secondary | ICD-10-CM | POA: Diagnosis not present

## 2020-06-04 DIAGNOSIS — Z79899 Other long term (current) drug therapy: Secondary | ICD-10-CM | POA: Insufficient documentation

## 2020-06-04 DIAGNOSIS — I120 Hypertensive chronic kidney disease with stage 5 chronic kidney disease or end stage renal disease: Secondary | ICD-10-CM | POA: Diagnosis not present

## 2020-06-04 DIAGNOSIS — N186 End stage renal disease: Secondary | ICD-10-CM | POA: Diagnosis not present

## 2020-06-04 DIAGNOSIS — R52 Pain, unspecified: Secondary | ICD-10-CM | POA: Diagnosis not present

## 2020-06-04 DIAGNOSIS — J811 Chronic pulmonary edema: Secondary | ICD-10-CM | POA: Diagnosis not present

## 2020-06-04 DIAGNOSIS — I517 Cardiomegaly: Secondary | ICD-10-CM | POA: Diagnosis not present

## 2020-06-04 DIAGNOSIS — Z743 Need for continuous supervision: Secondary | ICD-10-CM | POA: Diagnosis not present

## 2020-06-04 DIAGNOSIS — L299 Pruritus, unspecified: Secondary | ICD-10-CM | POA: Diagnosis not present

## 2020-06-04 DIAGNOSIS — R0689 Other abnormalities of breathing: Secondary | ICD-10-CM | POA: Diagnosis not present

## 2020-06-04 LAB — BASIC METABOLIC PANEL
Anion gap: 19 — ABNORMAL HIGH (ref 5–15)
BUN: 78 mg/dL — ABNORMAL HIGH (ref 6–20)
CO2: 23 mmol/L (ref 22–32)
Calcium: 9.3 mg/dL (ref 8.9–10.3)
Chloride: 96 mmol/L — ABNORMAL LOW (ref 98–111)
Creatinine, Ser: 15.59 mg/dL — ABNORMAL HIGH (ref 0.61–1.24)
GFR calc Af Amer: 4 mL/min — ABNORMAL LOW (ref >60)
GFR calc non Af Amer: 3 mL/min — ABNORMAL LOW (ref >60)
Glucose, Bld: 77 mg/dL (ref 70–99)
Potassium: 4.7 mmol/L (ref 3.5–5.1)
Sodium: 138 mmol/L (ref 135–145)

## 2020-06-04 LAB — MAGNESIUM: Magnesium: 2.8 mg/dL — ABNORMAL HIGH (ref 1.7–2.4)

## 2020-06-04 LAB — CBC
HCT: 23.7 % — ABNORMAL LOW (ref 39.0–52.0)
Hemoglobin: 7.6 g/dL — ABNORMAL LOW (ref 13.0–17.0)
MCH: 30.9 pg (ref 26.0–34.0)
MCHC: 32.1 g/dL (ref 30.0–36.0)
MCV: 96.3 fL (ref 80.0–100.0)
Platelets: 165 10*3/uL (ref 150–400)
RBC: 2.46 MIL/uL — ABNORMAL LOW (ref 4.22–5.81)
RDW: 16 % — ABNORMAL HIGH (ref 11.5–15.5)
WBC: 7.8 10*3/uL (ref 4.0–10.5)
nRBC: 0 % (ref 0.0–0.2)

## 2020-06-04 IMAGING — CR DG CHEST 2V
1 series · 2 of 2 positions shown · non-contrast
Comparison: Chest x-ray dated [DATE].

CLINICAL DATA: Shortness of breath.

EXAM:
CHEST - 2 VIEW

[Series 1: dg chest 2 view · 0.14mm/px · 2 of 2 slices shown]
[im 1/2]
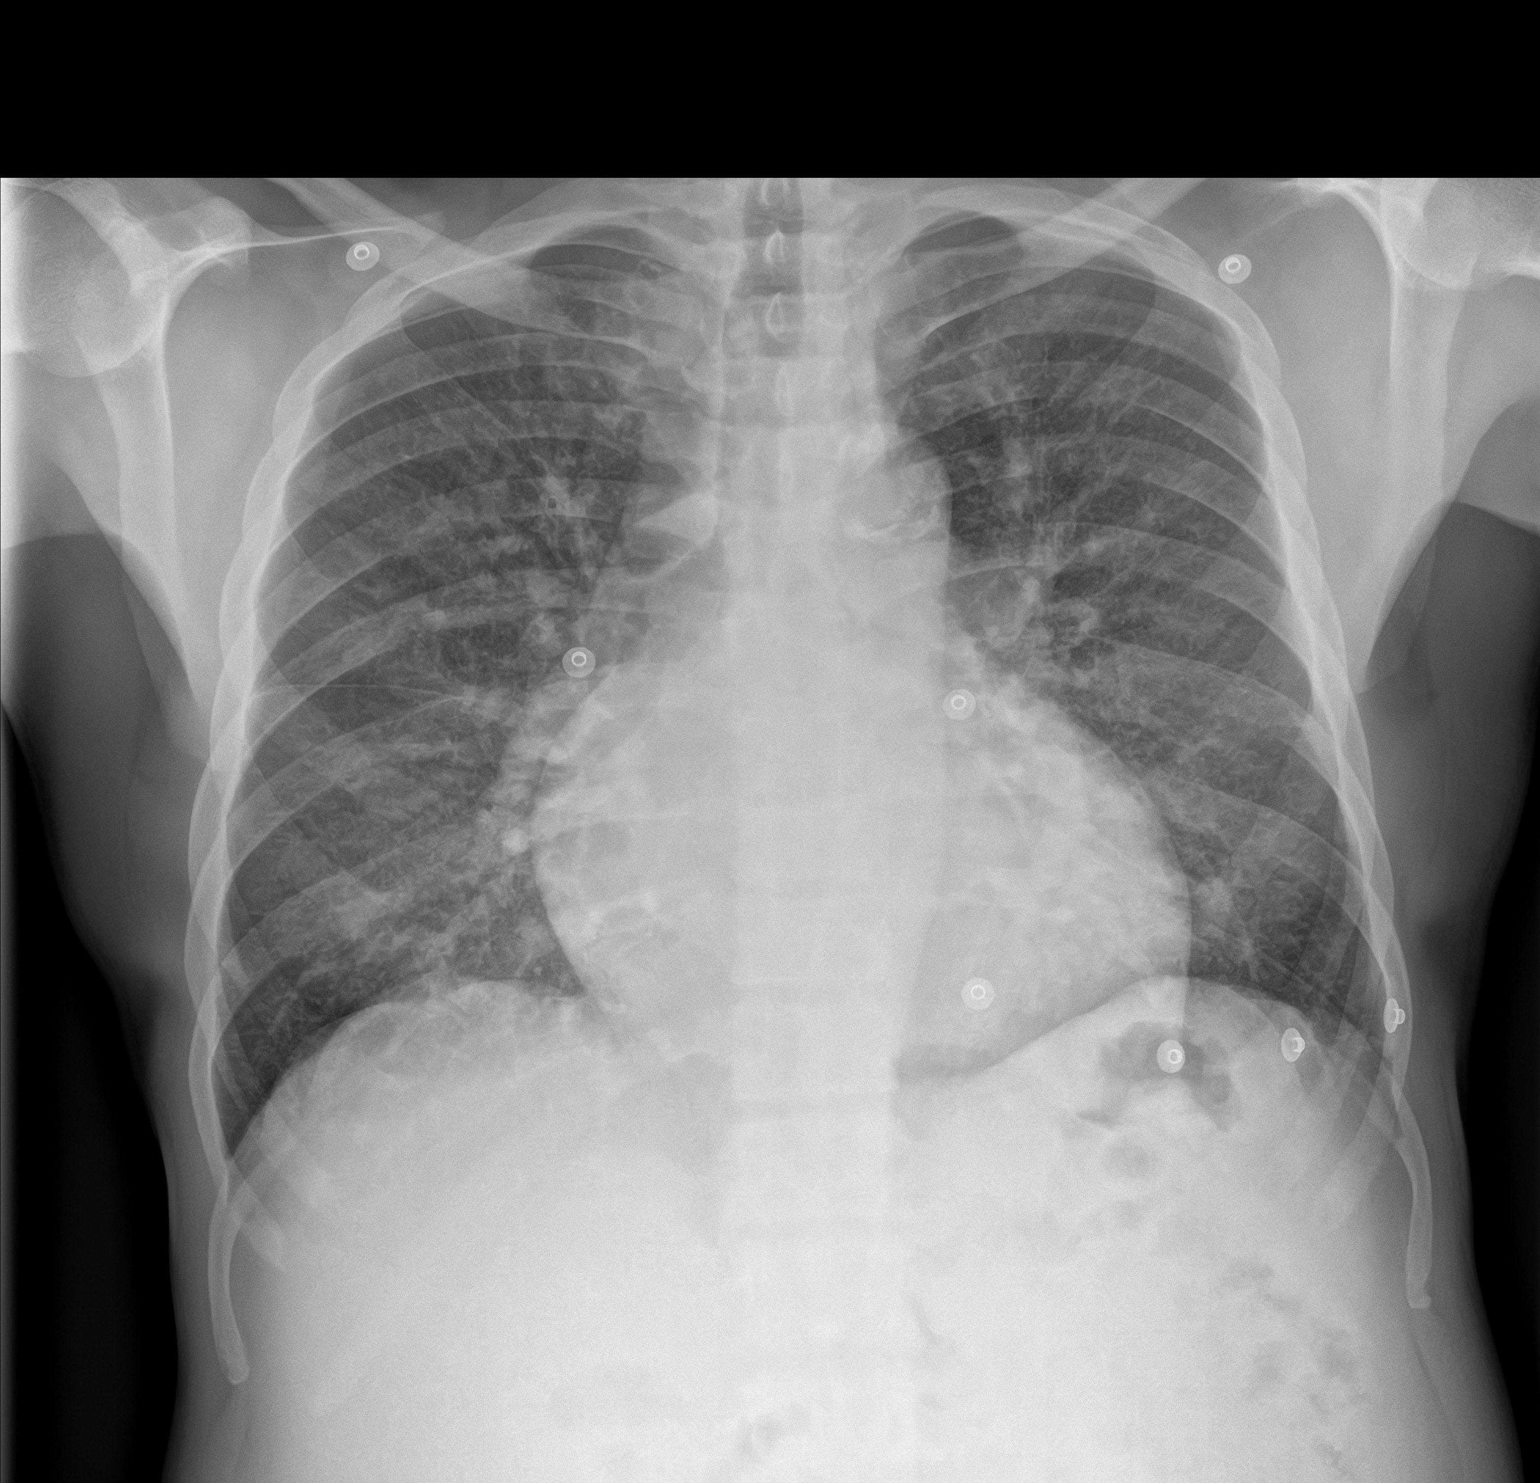
[im 2/2]
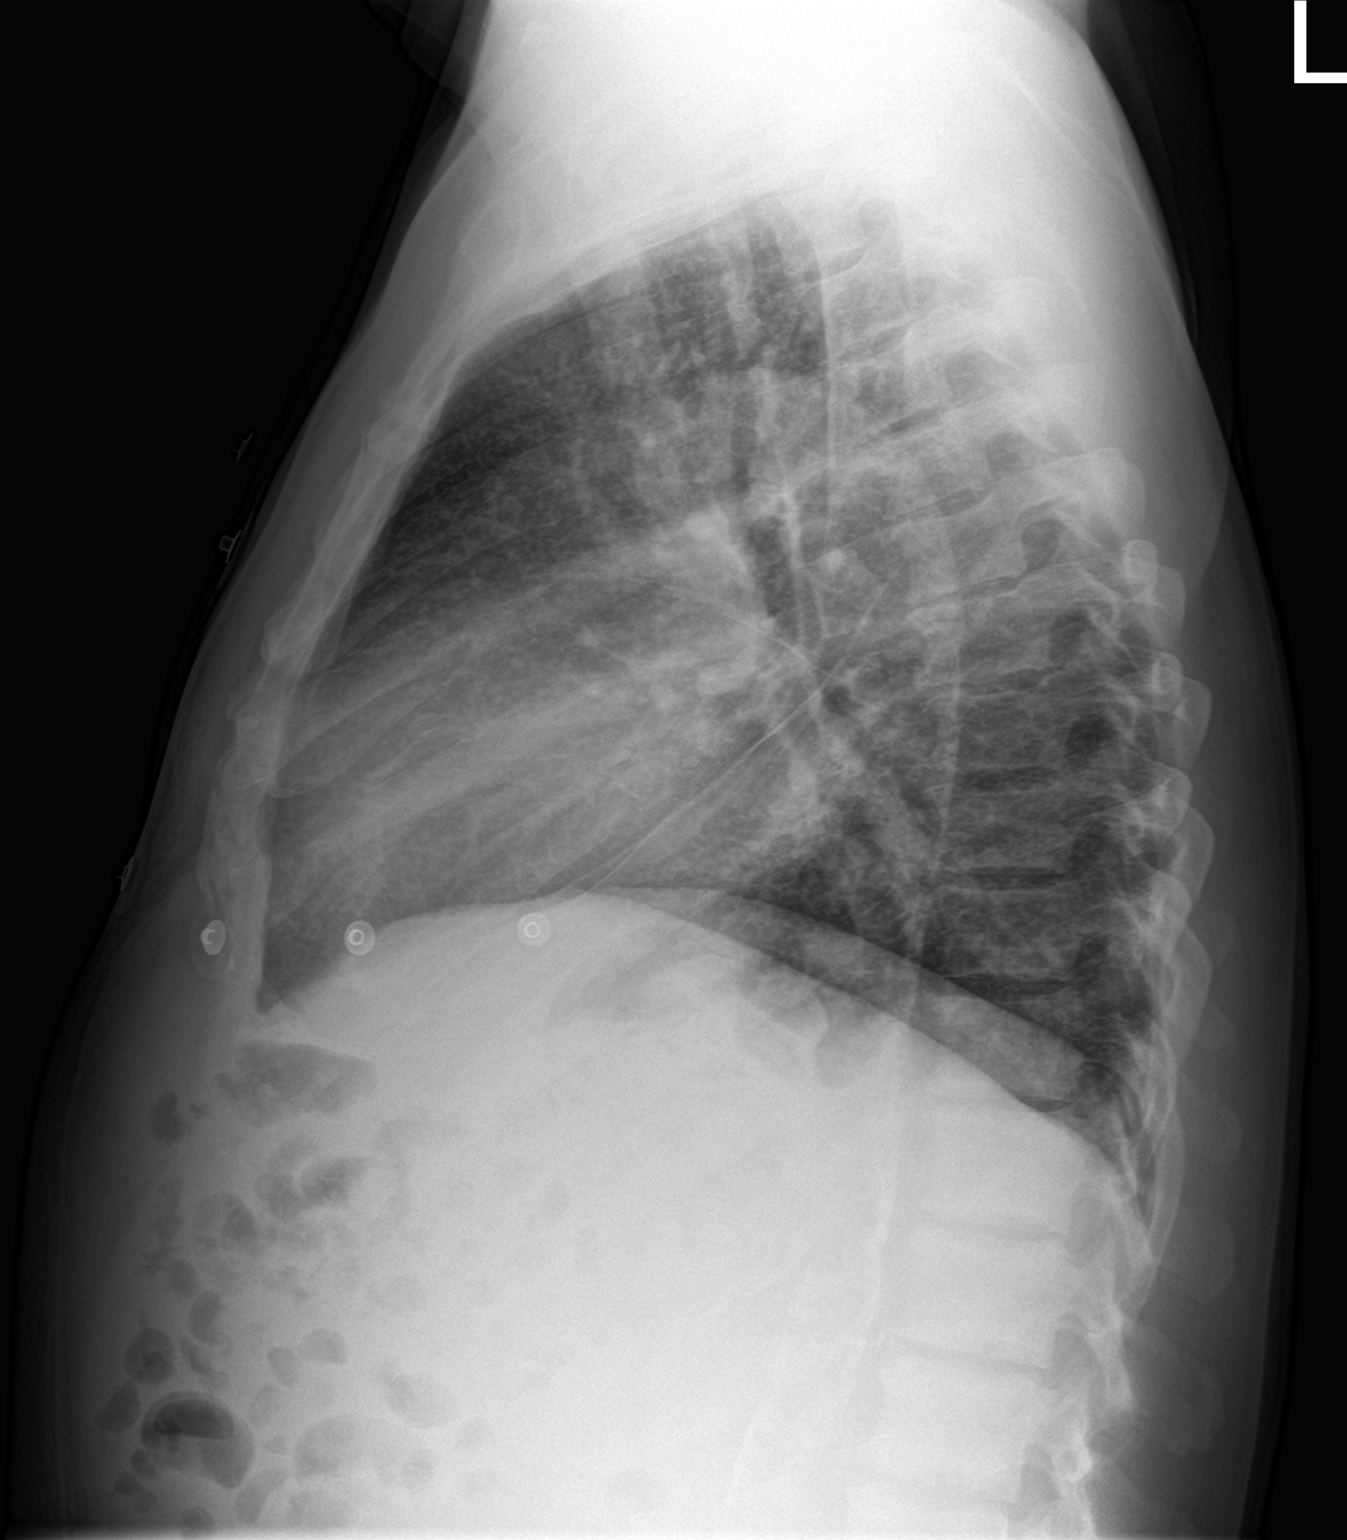

[2 of 2 positions shown; findings below may reference images not displayed]

FINDINGS: Unchanged mild cardiomegaly. Persistent pulmonary vascular
congestion and mild interstitial thickening, slightly progressed
since the prior study. No focal consolidation, pleural effusion, or
pneumothorax. No acute osseous abnormality.
IMPRESSION: 1. Slightly worsened mild interstitial pulmonary edema.

## 2020-06-04 MED ORDER — HYDROXYZINE HCL 50 MG PO TABS: 50.0000 mg | ORAL_TABLET | Freq: Three times a day (TID) | ORAL | 0 refills | Status: DC | PRN

## 2020-06-04 MED ORDER — FUROSEMIDE 10 MG/ML IJ SOLN
120.0000 mg | Freq: Once | INTRAVENOUS | Status: AC
  Administered 2020-06-04: 120 mg via INTRAVENOUS
  Filled 2020-06-04: qty 12

## 2020-06-04 MED ORDER — HYDROXYZINE HCL 25 MG PO TABS
25.0000 mg | ORAL_TABLET | Freq: Once | ORAL | Status: AC
  Administered 2020-06-04: 25 mg via ORAL
  Filled 2020-06-04: qty 1

## 2020-06-04 NOTE — ED Triage Notes (Signed)
Pt in via [facility] from home with c/o SOB, generalized itching and bilateral leg pain since Friday. Pt is on dialysis and did not get his full treatment Friday. Pt is due again today.

## 2020-06-04 NOTE — ED Provider Notes (Signed)
-----------------------------------------   5:05 PM on 06/04/2020 -----------------------------------------  The patient was signed out to me by Dr. Charna Archer.  Plan was to observe after he got IV Lasix.  If he could be weaned off of 2 L of oxygen, he would go home with a plan for dialysis tomorrow.  If not, he would be admitted.  The patient came out of the room and inform the RN that he was apparently not getting enough attention and wanted to leave.  I was with another patient at that time, and he declined to wait so that I could speak to him.  He was noted to be ambulatory and breathing without apparent difficulty.   Arta Silence, MD 06/04/20 [phone]

## 2020-06-04 NOTE — ED Notes (Signed)
Pt with trial on room air, sats dropping to 86%; [place] placed back on @ 2L

## 2020-06-04 NOTE — Discharge Instructions (Signed)
Please go to your usual dialysis center tomorrow before 11 AM for a treatment.  You may take the Atarax as needed for itching.

## 2020-06-04 NOTE — ED Provider Notes (Signed)
[facility] Emergency Department Provider Note   ____________________________________________   First MD Initiated Contact with Patient 06/04/20 1338     (approximate)  I have reviewed the triage vital signs and the nursing notes.   HISTORY  Chief Complaint Shortness of Breath    HPI Jordan [name] is a 54 y.o. male with past medical history of ESRD on HD (MWF), hypertension, atrial fibrillation, and CHF who presents to the ED complaining of shortness of breath.  Patient reports that he has had increasing difficulty breathing over the weekend but denies any chest pain or cough.  He does state that he only completed about an hour of his dialysis treatment 3 days ago because he could not stand sitting in the chair and itching.  He then felt he would be unable to tolerate his dialysis treatment today due to ongoing itching and decided to come to the ED for further evaluation.  He states he itches primarily along the back of both of his legs and has noticed a rash there.  He states this itching has been going on for over 1 month and medication he was prescribed for before helped slightly but he ran out.  He is also complaining of swelling to each of his eyelids, denies any problems with his vision.        Past Medical History:  Diagnosis Date  . Anemia   . Aortic atherosclerosis ([place]) 11/12/2019  . CKD (chronic kidney disease)    Stage 5  Dialysis - M/W/F in [place], [place]  . Dyspnea    tx with inhaler when sick  . ED (erectile dysfunction)   . Emphysema of lung ([place]) 11/12/2019  . ETOH abuse   . History of blood transfusion   . Hypertension   . Wears dentures     Patient Active Problem List   Diagnosis Date Noted  . Depression 04/09/2020  . Current mild episode of major depressive disorder without prior episode ([place]) 04/09/2020  . Atrial fibrillation with RVR ([place]) 04/04/2020  . Acute on chronic systolic CHF (congestive heart failure) ([place])  04/04/2020  . Non-compliance with renal dialysis ([place]) 03/21/2020  . Acute CHF (congestive heart failure) ([place]) 03/12/2020  . 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 ([place]) 02/03/2020  . Acute on chronic combined systolic and diastolic CHF (congestive heart failure) ([place]) 02/03/2020  . Pulmonary edema 01/31/2020  . Syncopal episodes 01/24/2020  . Macrocytic anemia 01/24/2020  . Hyperkalemia 01/24/2020  . Fluid overload 11/12/2019  . Acute respiratory failure with hypoxia ([place]) 11/12/2019  . Aortic atherosclerosis ([place]) 11/12/2019  . Emphysema of lung ([place]) 11/12/2019  . Acute pulmonary edema ([place]) 10/31/2019  . Acute on chronic congestive heart failure ([place]) 10/22/2019  . Acute respiratory distress 10/06/2019  . Volume overload 10/05/2019  . Hypokalemia 10/05/2019  . ESRD (end stage renal disease) ([place]) 10/01/2019  . Leg mass, right   . Hyperbilirubinemia   . Symptomatic anemia 09/19/2019  . Pruritus 09/19/2019  . Hemoptysis 02/11/2019  . Hypercalcemia 01/19/2019  . Elevated troponin 05/17/2018  . Anemia in ESRD (end-stage renal disease) ([place]) 05/17/2018  . Hepatitis B 03/19/2018  . ESRD on dialysis ([place]) 05/26/2017  . Essential hypertension 05/26/2017  . Urinary tract infection, site not specified 10/17/2016  . Coagulation defect, unspecified ([place]) 08/13/2016  . Diarrhea, unspecified 08/13/2016  . Encounter for immunization 08/13/2016  . Headache, unspecified 08/13/2016  . Hypertensive chronic kidney disease with stage 5 chronic kidney disease or end stage renal  disease ([place]) 08/13/2016  . Iron deficiency anemia, unspecified 08/13/2016  . Pain, unspecified 08/13/2016  . Moderate protein-calorie malnutrition ([place]) 08/13/2016  . Secondary hyperparathyroidism of renal origin ([place]) 08/13/2016    Past Surgical History:  Procedure Laterality Date  . BASCILIC VEIN TRANSPOSITION Left 08/07/2016   Procedure: LEFT BASILIC VEIN TRANSPOSITION;  Surgeon: Angelia Mould, MD;  Location: [place];  Service: Vascular;  Laterality: Left;  . CLOSED REDUCTION NASAL FRACTURE N/A 08/11/2019   Procedure: CLOSED REDUCTION NASAL FRACTURE WITH STABILIZATION;  Surgeon: Irene Limbo, MD;  Location: [place];  Service: Plastics;  Laterality: N/A;  . COLONOSCOPY N/A 08/12/2016   Procedure: COLONOSCOPY;  Surgeon: Otis Brace, MD;  Location: [place];  Service: Gastroenterology;  Laterality: N/A;  . ESOPHAGOGASTRODUODENOSCOPY N/A 08/12/2016   Procedure: ESOPHAGOGASTRODUODENOSCOPY (EGD);  Surgeon: Otis Brace, MD;  Location: [place];  Service: Gastroenterology;  Laterality: N/A;  . INSERTION OF DIALYSIS CATHETER N/A 08/07/2016   Procedure: INSERTION OF TUNNELED DIALYSIS CATHETER;  Surgeon: Angelia Mould, MD;  Location: [place];  Service: Vascular;  Laterality: N/A;  . LIGATION OF ARTERIOVENOUS  FISTULA Left 09/12/2016   Procedure: BANDING OF LEFT  ARTERIOVENOUS  FISTULA;  Surgeon: Angelia Mould, MD;  Location: [place];  Service: Vascular;  Laterality: Left;  . LOWER EXTREMITY INTERVENTION Right 12/02/2018   Procedure: LOWER EXTREMITY INTERVENTION;  Surgeon: Algernon Huxley, MD;  Location: [facility];  Service: Cardiovascular;  Laterality: Right;    Prior to Admission medications   Medication Sig Start Date End Date Taking? Authorizing Provider  calcium acetate (PHOSLO) 667 MG capsule Take 2 capsules (1,334 mg total) by mouth 3 (three) times daily with meals. Patient taking differently: Take 2,001 mg by mouth 3 (three) times daily with meals.  02/12/19  Yes Fritzi Mandes, MD  camphor-menthol [facility]) lotion Apply 1 application topically as needed for itching. 11/23/19  Yes Upstill, Shari, PA-C  OLANZapine (ZYPREXA) 2.5 MG tablet Take 1 tablet (2.5 mg total) by mouth at bedtime. 04/10/20  Yes Candee Furbish, MD  patiromer (VELTASSA) 8.4 g packet Take 1 packet (8.4 g total) by mouth daily. 03/14/20  Yes Lorella Nimrod, MD  sevelamer carbonate  (RENVELA) 800 MG tablet Take 800 mg by mouth 3 (three) times daily with meals.   Yes [provider]  hydrOXYzine (ATARAX/VISTARIL) 50 MG tablet Take 1 tablet (50 mg total) by mouth 3 (three) times daily as needed. 06/04/20   Blake Divine, MD    Allergies Patient has no known allergies.  Family History  Problem Relation Age of Onset  . Diabetes Mother   . Kidney failure Mother   . Healthy Father   . Kidney failure Brother   . Healthy Sister   . Kidney disease Daughter   . Post-traumatic stress disorder Neg Hx   . Bladder Cancer Neg Hx   . Kidney cancer Neg Hx     Social History Social History   Tobacco Use  . Smoking status: Current Every Day Smoker    Packs/day: 0.50    Years: 40.00    Pack years: 20.00    Types: Cigarettes  . Smokeless tobacco: Never Used  Vaping Use  . Vaping Use: Never used  Substance Use Topics  . Alcohol use: Yes    Alcohol/week: 21.0 standard drinks    Types: 21 Cans of beer per week    Comment: none since 08/04/16  . Drug use: No    Review of Systems  Constitutional: No fever/chills  Eyes: No visual changes. ENT: No sore throat. Cardiovascular: Denies chest pain. Respiratory: Positive for shortness of breath. Gastrointestinal: No abdominal pain.  No nausea, no vomiting.  No diarrhea.  No constipation. Genitourinary: Negative for dysuria. Musculoskeletal: Negative for back pain. Skin: Positive for rash. Neurological: Negative for headaches, focal weakness or numbness.  ____________________________________________   PHYSICAL EXAM:  VITAL SIGNS: ED Triage Vitals  Enc Vitals Group     BP 06/04/20 0846 (!) 159/95     Pulse Rate 06/04/20 0846 99     Resp 06/04/20 0846 20     Temp 06/04/20 0851 98.4 F (36.9 C)     Temp Source 06/04/20 0846 Oral     SpO2 06/04/20 0846 90 %     Weight 06/04/20 0847 170 lb (77.1 kg)     Height 06/04/20 0847 5\' 4"  (1.626 m)     Head Circumference --      Peak Flow --      Pain Score  06/04/20 0847 8     Pain Loc --      Pain Edu? --      Excl. in [place]? --     Constitutional: Alert and oriented. Eyes: Conjunctivae are normal.  Nontender lesion to left upper eyelid. Head: Atraumatic. Nose: No congestion/rhinnorhea. Mouth/Throat: Mucous membranes are moist. Neck: Normal ROM Cardiovascular: Normal rate, regular rhythm. Grossly normal heart sounds.  Left upper extremity AV fistula with palpable thrill. Respiratory: Normal respiratory effort.  No retractions. Lungs with crackles to bilateral bases. Gastrointestinal: Soft and nontender. No distention. Genitourinary: deferred Musculoskeletal: No lower extremity tenderness nor edema. Neurologic:  Normal speech and language. No gross focal neurologic deficits are appreciated. Skin:  Skin is warm, dry and intact.  Excoriations noted to bilateral legs, no erythema or warmth. Psychiatric: Mood and affect are normal. Speech and behavior are normal.  ____________________________________________   LABS (all labs ordered are listed, but only abnormal results are displayed)  Labs Reviewed  BASIC METABOLIC PANEL - Abnormal; Notable for the following components:      Result Value   Chloride 96 (*)    BUN 78 (*)    Creatinine, Ser 15.59 (*)    GFR calc non Af Amer 3 (*)    GFR calc Af Amer 4 (*)    Anion gap 19 (*)    All other components within normal limits  CBC - Abnormal; Notable for the following components:   RBC 2.46 (*)    Hemoglobin 7.6 (*)    HCT 23.7 (*)    RDW 16.0 (*)    All other components within normal limits  MAGNESIUM - Abnormal; Notable for the following components:   Magnesium 2.8 (*)    All other components within normal limits   ____________________________________________  EKG  ED ECG REPORT I, Blake Divine, the attending physician, personally viewed and interpreted this ECG.   Date: 06/04/2020  EKG Time: 8:52  Rate: 97  Rhythm: normal sinus rhythm  Axis: Normal  Intervals:Prolonged  QT  ST&T Change: None   PROCEDURES  Procedure(s) performed (including Critical Care):  Procedures   ____________________________________________   INITIAL IMPRESSION / ASSESSMENT AND PLAN / ED COURSE       54 year old male with past medical history of ESRD on HD, hypertension, atrial fib, and CHF who presents to the ED complaining of increasing shortness of breath over the past couple of days after he missed his last 2 dialysis treatments with itching.  He does appear slightly fluid overloaded with  pulmonary edema noted on chest x-ray.  He is not in any respiratory distress but does drop O2 sats to 88% on room air and was placed on 2 L nasal cannula.  Plan to discuss arrangements for dialysis with nephrology.  As for his itching, this appears to be a chronic complaint for the patient and he has been prescribed both Atarax and Seroquel for this in the past.  There is no evidence of infectious process at this time or calciphylaxis.  He would benefit from outpatient referral to dermatology but no acute intervention needed at this time, we will treat with Atarax.  Case discussed with Dr. Holley Raring of nephrology, who recommends attempt at diuresis with 120 mg of Lasix given patient still makes urine.  If he is able to diurese and we are able to wean him off the 2 L nasal cannula, he would be appropriate for discharge home and Dr. Holley Raring has arranged for patient to receive dialysis at his usual center tomorrow before 11 AM.  If we are unable to wean patient from oxygen, he may be admitted to hospitalist service for dialysis in the morning.      ____________________________________________   FINAL CLINICAL IMPRESSION(S) / ED DIAGNOSES  Final diagnoses:  Shortness of breath  ESRD on dialysis [facility])     ED Discharge Orders         Ordered    hydrOXYzine (ATARAX/VISTARIL) 50 MG tablet  3 times daily PRN        06/04/20 1543           Note:  This document was prepared using Dragon  voice recognition software and may include unintentional dictation errors.   Blake Divine, MD 06/04/20 1544

## 2020-06-04 NOTE — ED Notes (Signed)
Pt came out of room demanding to have PIV removed so he could leave; this RN was in another room with patient; PIV reported removed by another staff member; pt would not wait to speak with MD or sign AMA

## 2020-06-04 NOTE — ED Triage Notes (Signed)
Pt comes into the ED via EMS from dialysis with c/o SOB all weekend with swelling of BL eyes and itching of back and BL LE since Friday. Pt is in NAD, did not receive any of his dialysis treatment today. Only had 1.5hrs of treatment on friday

## 2020-06-06 ENCOUNTER — Encounter (HOSPITAL_COMMUNITY): Payer: Self-pay | Admitting: Emergency Medicine

## 2020-06-06 ENCOUNTER — Emergency Department (HOSPITAL_COMMUNITY): Payer: Medicare Other

## 2020-06-06 ENCOUNTER — Other Ambulatory Visit: Payer: Self-pay

## 2020-06-06 ENCOUNTER — Emergency Department (HOSPITAL_COMMUNITY)
Admission: EM | Admit: 2020-06-06 | Discharge: 2020-06-06 | Discharge status: Against Medical Advice | Payer: Medicare Other | Attending: Emergency Medicine | Admitting: Emergency Medicine

## 2020-06-06 DIAGNOSIS — R079 Chest pain, unspecified: Secondary | ICD-10-CM | POA: Diagnosis not present

## 2020-06-06 DIAGNOSIS — Z743 Need for continuous supervision: Secondary | ICD-10-CM | POA: Diagnosis not present

## 2020-06-06 DIAGNOSIS — Z992 Dependence on renal dialysis: Secondary | ICD-10-CM | POA: Diagnosis not present

## 2020-06-06 DIAGNOSIS — I132 Hypertensive heart and chronic kidney disease with heart failure and with stage 5 chronic kidney disease, or end stage renal disease: Secondary | ICD-10-CM | POA: Diagnosis not present

## 2020-06-06 DIAGNOSIS — I5043 Acute on chronic combined systolic (congestive) and diastolic (congestive) heart failure: Secondary | ICD-10-CM | POA: Diagnosis not present

## 2020-06-06 DIAGNOSIS — Z4931 Encounter for adequacy testing for hemodialysis: Secondary | ICD-10-CM | POA: Insufficient documentation

## 2020-06-06 DIAGNOSIS — Z20822 Contact with and (suspected) exposure to covid-19: Secondary | ICD-10-CM | POA: Diagnosis not present

## 2020-06-06 DIAGNOSIS — F17210 Nicotine dependence, cigarettes, uncomplicated: Secondary | ICD-10-CM | POA: Diagnosis not present

## 2020-06-06 DIAGNOSIS — R0902 Hypoxemia: Secondary | ICD-10-CM

## 2020-06-06 DIAGNOSIS — N186 End stage renal disease: Secondary | ICD-10-CM | POA: Insufficient documentation

## 2020-06-06 DIAGNOSIS — R0789 Other chest pain: Secondary | ICD-10-CM | POA: Diagnosis not present

## 2020-06-06 DIAGNOSIS — I517 Cardiomegaly: Secondary | ICD-10-CM | POA: Diagnosis not present

## 2020-06-06 DIAGNOSIS — R140 Abdominal distension (gaseous): Secondary | ICD-10-CM | POA: Diagnosis not present

## 2020-06-06 DIAGNOSIS — R0602 Shortness of breath: Secondary | ICD-10-CM | POA: Diagnosis not present

## 2020-06-06 LAB — CBC WITH DIFFERENTIAL/PLATELET
Abs Immature Granulocytes: 0.06 10*3/uL (ref 0.00–0.07)
Basophils Absolute: 0 10*3/uL (ref 0.0–0.1)
Basophils Relative: 1 %
Eosinophils Absolute: 0.2 10*3/uL (ref 0.0–0.5)
Eosinophils Relative: 3 %
HCT: 25.2 % — ABNORMAL LOW (ref 39.0–52.0)
Hemoglobin: 7.5 g/dL — ABNORMAL LOW (ref 13.0–17.0)
Immature Granulocytes: 1 %
Lymphocytes Relative: 10 %
Lymphs Abs: 0.7 10*3/uL (ref 0.7–4.0)
MCH: 29.8 pg (ref 26.0–34.0)
MCHC: 29.8 g/dL — ABNORMAL LOW (ref 30.0–36.0)
MCV: 100 fL (ref 80.0–100.0)
Monocytes Absolute: 0.5 10*3/uL (ref 0.1–1.0)
Monocytes Relative: 8 %
Neutro Abs: 5.2 10*3/uL (ref 1.7–7.7)
Neutrophils Relative %: 77 %
Platelets: 143 10*3/uL — ABNORMAL LOW (ref 150–400)
RBC: 2.52 MIL/uL — ABNORMAL LOW (ref 4.22–5.81)
RDW: 16.3 % — ABNORMAL HIGH (ref 11.5–15.5)
WBC: 6.7 10*3/uL (ref 4.0–10.5)
nRBC: 0 % (ref 0.0–0.2)

## 2020-06-06 LAB — COMPREHENSIVE METABOLIC PANEL
ALT: 8 U/L (ref 0–44)
AST: 9 U/L — ABNORMAL LOW (ref 15–41)
Albumin: 3.6 g/dL (ref 3.5–5.0)
Alkaline Phosphatase: 130 U/L — ABNORMAL HIGH (ref 38–126)
Anion gap: 23 — ABNORMAL HIGH (ref 5–15)
BUN: 99 mg/dL — ABNORMAL HIGH (ref 6–20)
CO2: 18 mmol/L — ABNORMAL LOW (ref 22–32)
Calcium: 9.5 mg/dL (ref 8.9–10.3)
Chloride: 98 mmol/L (ref 98–111)
Creatinine, Ser: 18.17 mg/dL — ABNORMAL HIGH (ref 0.61–1.24)
GFR calc Af Amer: 3 mL/min — ABNORMAL LOW (ref >60)
GFR calc non Af Amer: 3 mL/min — ABNORMAL LOW (ref >60)
Glucose, Bld: 77 mg/dL (ref 70–99)
Potassium: 5.6 mmol/L — ABNORMAL HIGH (ref 3.5–5.1)
Sodium: 139 mmol/L (ref 135–145)
Total Bilirubin: 1.6 mg/dL — ABNORMAL HIGH (ref 0.3–1.2)
Total Protein: 7.4 g/dL (ref 6.5–8.1)

## 2020-06-06 LAB — SARS CORONAVIRUS 2 BY RT PCR (HOSPITAL ORDER, PERFORMED IN ~~LOC~~ HOSPITAL LAB): SARS Coronavirus 2: NEGATIVE

## 2020-06-06 LAB — POC SARS CORONAVIRUS 2 AG -  ED: SARS Coronavirus 2 Ag: NEGATIVE

## 2020-06-06 IMAGING — DX DG CHEST 2V
2 series · 2 of 2 positions shown · non-contrast
Comparison: [DATE]

CLINICAL DATA: Shortness of breath

EXAM:
CHEST - 2 VIEW

[x chest ap]
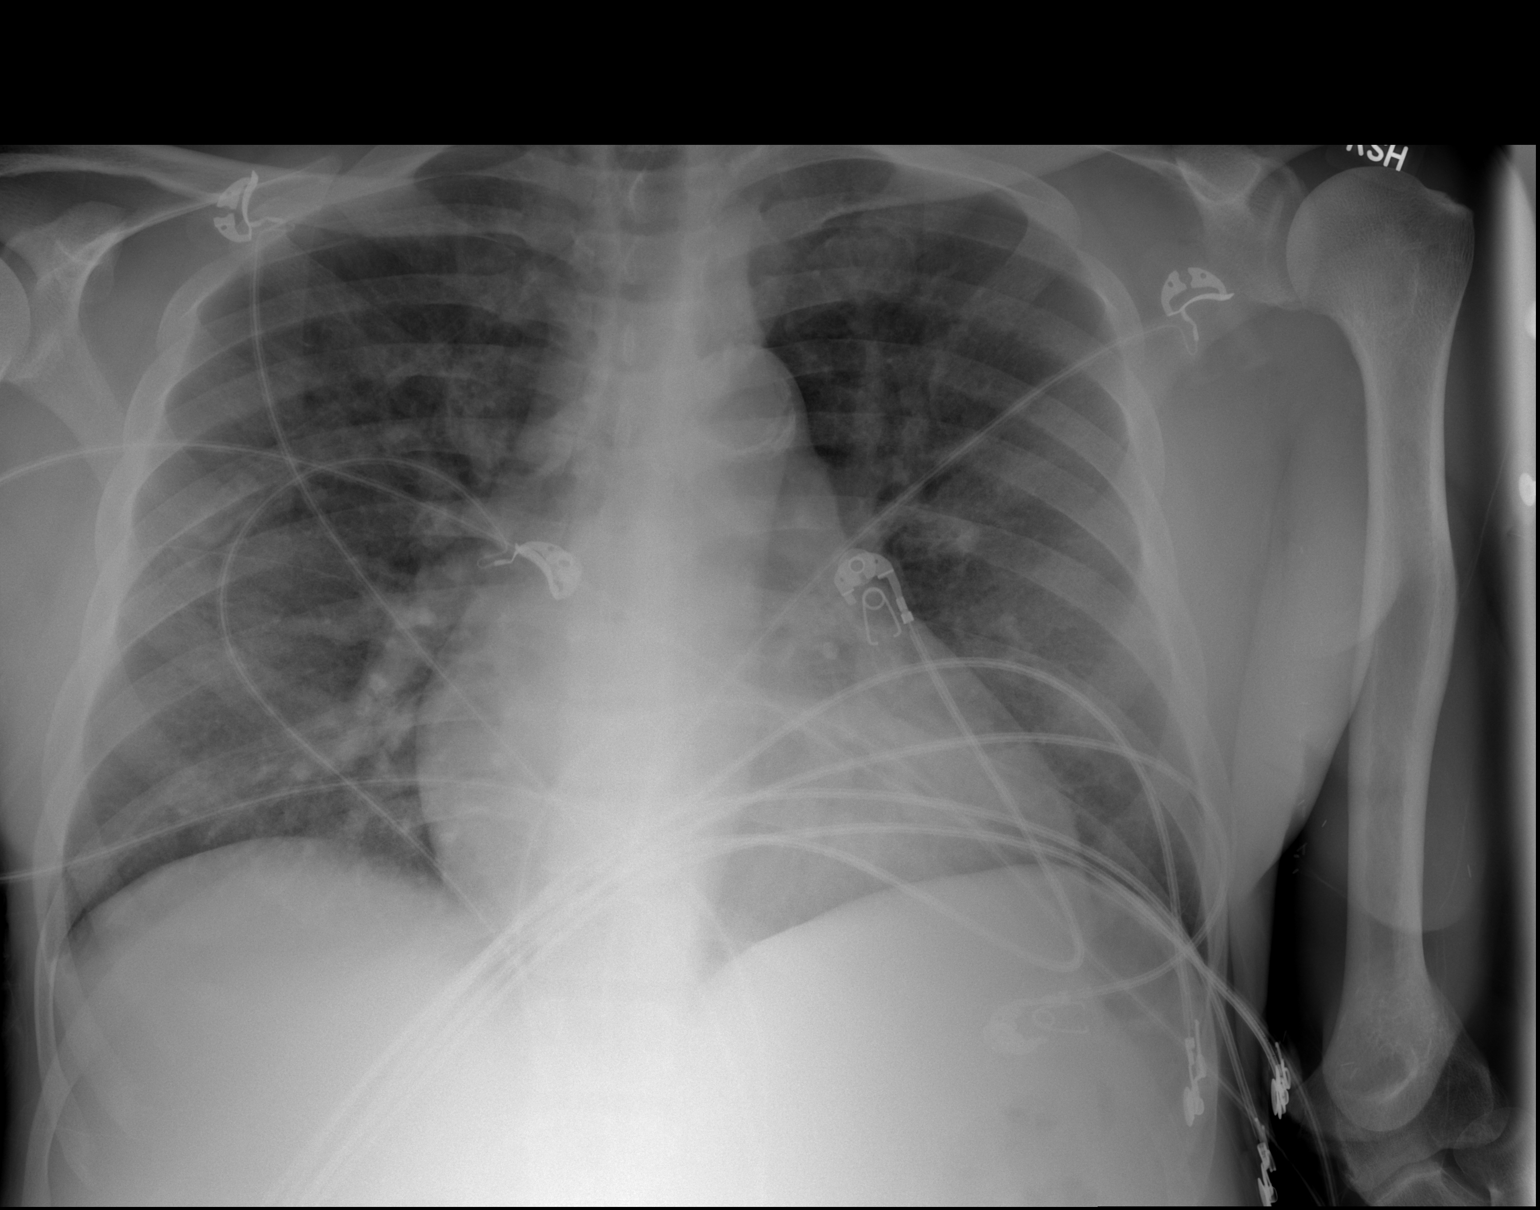

[w chest lat]
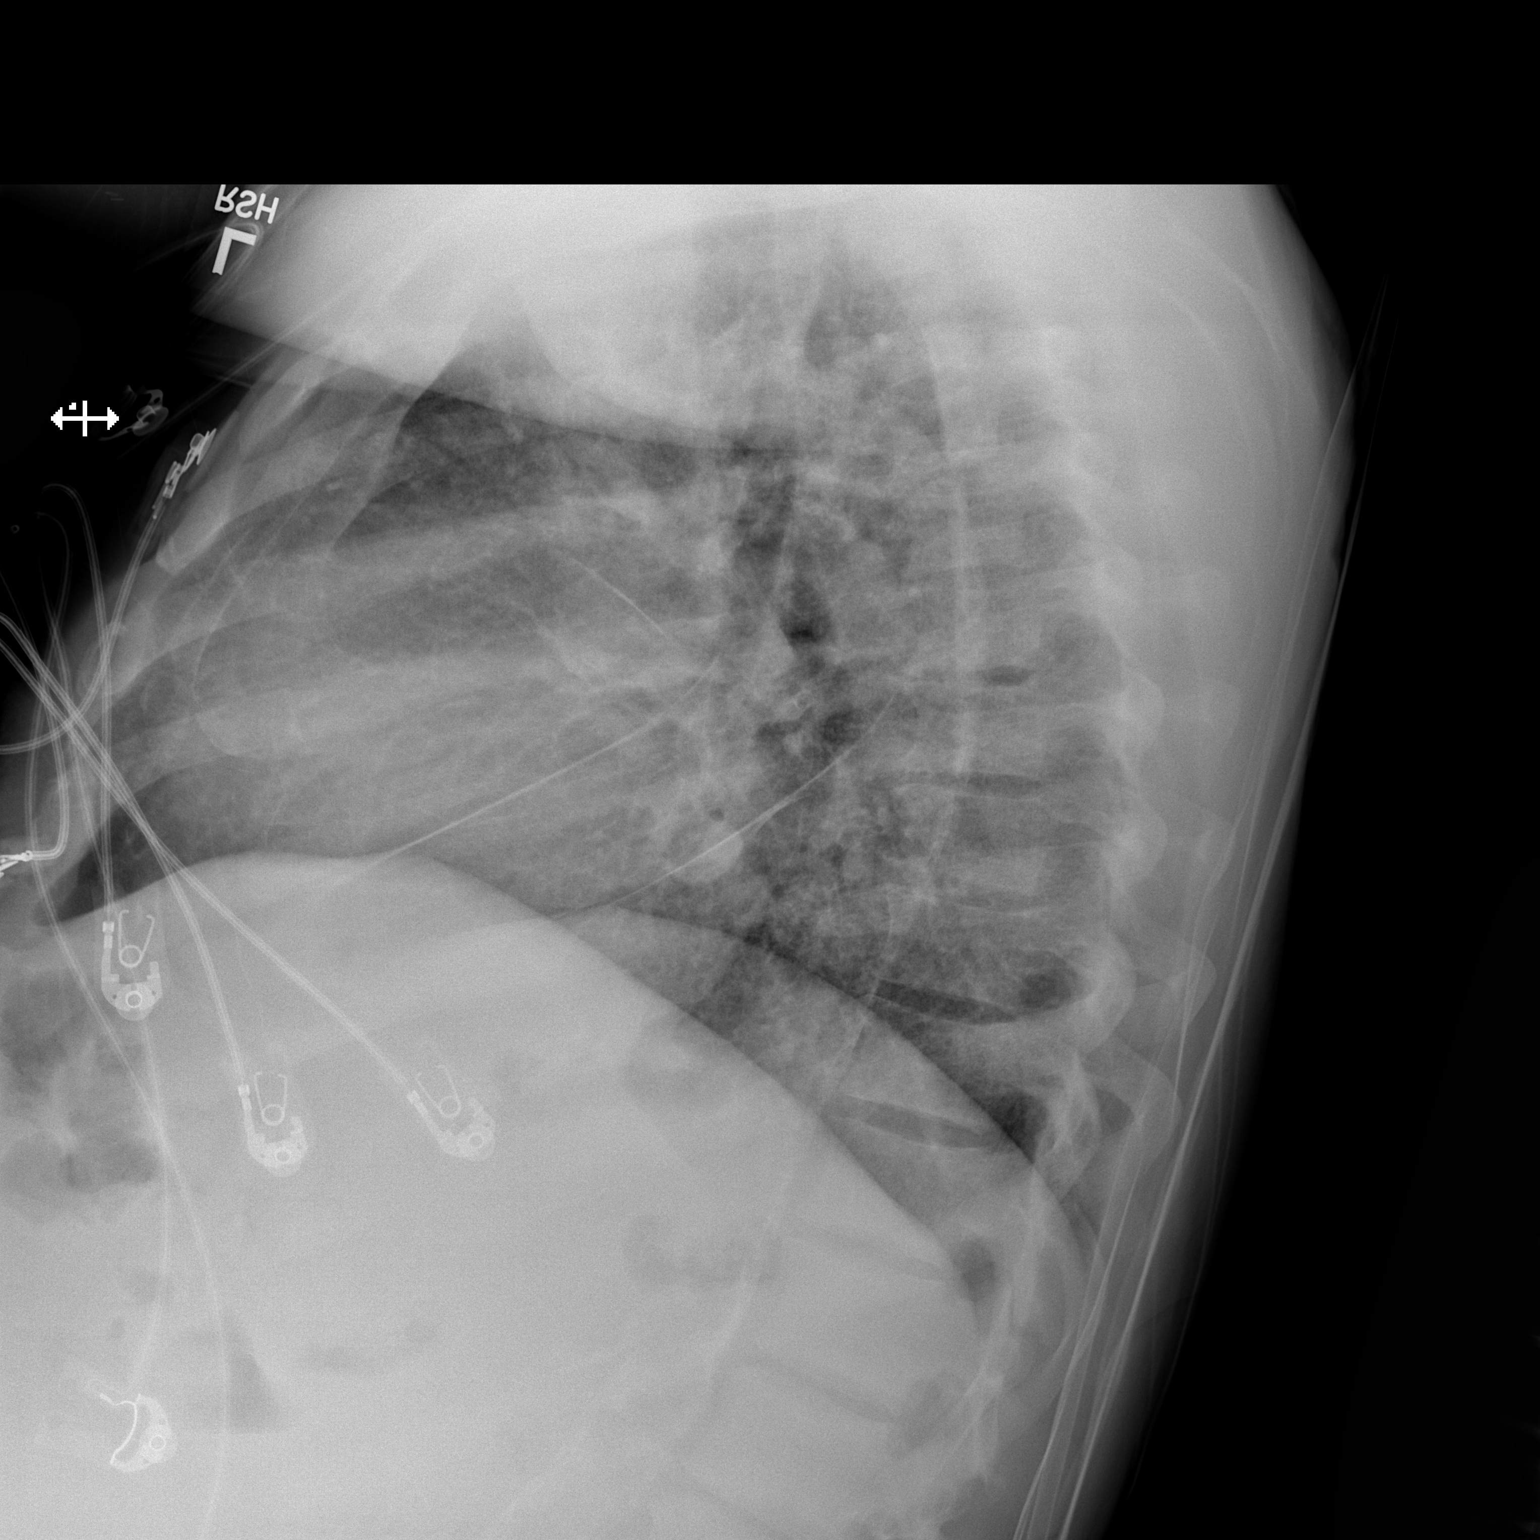

[2 of 2 positions shown; findings below may reference images not displayed]

FINDINGS: Cardiomegaly, vascular congestion. No overt edema. No confluent
opacities or effusions. No acute bony abnormality. Aortic
atherosclerosis.
IMPRESSION: Cardiomegaly, vascular congestion.

## 2020-06-06 MED ORDER — CHLORHEXIDINE GLUCONATE CLOTH 2 % EX PADS: 6.0000 | MEDICATED_PAD | Freq: Every day | CUTANEOUS | Status: DC

## 2020-06-06 MED ORDER — CINACALCET HCL 30 MG PO TABS
120.0000 mg | ORAL_TABLET | Freq: Once | ORAL | Status: DC
  Filled 2020-06-06: qty 4

## 2020-06-06 MED ORDER — HYDROXYZINE HCL 25 MG PO TABS
25.0000 mg | ORAL_TABLET | Freq: Once | ORAL | Status: AC
  Administered 2020-06-06: 25 mg via ORAL
  Filled 2020-06-06: qty 1

## 2020-06-06 MED ORDER — CALCITRIOL 0.5 MCG PO CAPS
1.0000 ug | ORAL_CAPSULE | Freq: Once | ORAL | Status: DC
  Filled 2020-06-06: qty 2

## 2020-06-06 NOTE — ED Provider Notes (Signed)
[facility] EMERGENCY DEPARTMENT Provider Note   CSN: [id number] Arrival date & time: 06/06/20  4098     History Chief Complaint  Patient presents with  . Shortness of Breath    Jordan [name] is a 54 y.o. male w PMHx ESRD on HD MWF, EtOH abuse, CHF, a fib, presenting to the emergency department with shortness of breath that has been worsening of the last multiple days.  Patient states he completed about 1 to 2 hours of dialysis on Friday, he did not go on Monday and is not on today.  He was evaluated at [facility] ED 2 days ago where he was given IV Lasix and was considered for admission, however patient decided to leave prior to completion of work-up.  He states he has not gone to dialysis due to his continuous itching which is not new for him.  He has treated with Vistaril and Benadryl without relief.  He reports shortness of breath and swelling of his eyes, as well as abdominal distention.  He is not on O2 at home.  Denies any fever or cough, no known Covid exposure.  Denies chest pain.  The history is provided by the patient and medical records.       Past Medical History:  Diagnosis Date  . Anemia   . Aortic atherosclerosis ([place]) 11/12/2019  . CKD (chronic kidney disease)    Stage 5  Dialysis - M/W/F in [place], [place]  . Dyspnea    tx with inhaler when sick  . ED (erectile dysfunction)   . Emphysema of lung ([place]) 11/12/2019  . ETOH abuse   . History of blood transfusion   . Hypertension   . Wears dentures     Patient Active Problem List   Diagnosis Date Noted  . Depression 04/09/2020  . Current mild episode of major depressive disorder without prior episode ([place]) 04/09/2020  . Atrial fibrillation with RVR ([place]) 04/04/2020  . Acute on chronic systolic CHF (congestive heart failure) ([place]) 04/04/2020  . Non-compliance with renal dialysis ([place]) 03/21/2020  . Acute CHF (congestive heart failure) ([place]) 03/12/2020  . 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 ([place]) 02/03/2020  . Acute on chronic combined systolic and diastolic CHF (congestive heart failure) ([place]) 02/03/2020  . Pulmonary edema 01/31/2020  . Syncopal episodes 01/24/2020  . Macrocytic anemia 01/24/2020  . Hyperkalemia 01/24/2020  . Fluid overload 11/12/2019  . Acute respiratory failure with hypoxia ([place]) 11/12/2019  . Aortic atherosclerosis ([place]) 11/12/2019  . Emphysema of lung ([place]) 11/12/2019  . Acute pulmonary edema ([place]) 10/31/2019  . Acute on chronic congestive heart failure ([place]) 10/22/2019  . Acute respiratory distress 10/06/2019  . Volume overload 10/05/2019  . Hypokalemia 10/05/2019  . ESRD (end stage renal disease) ([place]) 10/01/2019  . Leg mass, right   . Hyperbilirubinemia   . Symptomatic anemia 09/19/2019  . Pruritus 09/19/2019  . Hemoptysis 02/11/2019  . Hypercalcemia 01/19/2019  . Elevated troponin 05/17/2018  . Anemia in ESRD (end-stage renal disease) ([place]) 05/17/2018  . Hepatitis B 03/19/2018  . ESRD on dialysis ([place]) 05/26/2017  . Essential hypertension 05/26/2017  . Urinary tract infection, site not specified 10/17/2016  . Coagulation defect, unspecified ([place]) 08/13/2016  . Diarrhea, unspecified 08/13/2016  . Encounter for immunization 08/13/2016  . Headache, unspecified 08/13/2016  . Hypertensive chronic kidney disease with stage 5 chronic kidney disease or end stage renal disease ([place]) 08/13/2016  . Iron deficiency anemia, unspecified 08/13/2016  . Pain, unspecified 08/13/2016  .  Moderate protein-calorie malnutrition ([place]) 08/13/2016  . Secondary hyperparathyroidism of renal origin ([place]) 08/13/2016    Past Surgical History:  Procedure Laterality Date  . BASCILIC VEIN TRANSPOSITION Left 08/07/2016   Procedure: LEFT BASILIC VEIN TRANSPOSITION;  Surgeon: Angelia Mould, MD;  Location: [place];  Service: Vascular;  Laterality: Left;  . CLOSED REDUCTION NASAL FRACTURE N/A 08/11/2019   Procedure: CLOSED REDUCTION NASAL FRACTURE  WITH STABILIZATION;  Surgeon: Irene Limbo, MD;  Location: [place];  Service: Plastics;  Laterality: N/A;  . COLONOSCOPY N/A 08/12/2016   Procedure: COLONOSCOPY;  Surgeon: Otis Brace, MD;  Location: [place];  Service: Gastroenterology;  Laterality: N/A;  . ESOPHAGOGASTRODUODENOSCOPY N/A 08/12/2016   Procedure: ESOPHAGOGASTRODUODENOSCOPY (EGD);  Surgeon: Otis Brace, MD;  Location: [place];  Service: Gastroenterology;  Laterality: N/A;  . INSERTION OF DIALYSIS CATHETER N/A 08/07/2016   Procedure: INSERTION OF TUNNELED DIALYSIS CATHETER;  Surgeon: Angelia Mould, MD;  Location: [place];  Service: Vascular;  Laterality: N/A;  . LIGATION OF ARTERIOVENOUS  FISTULA Left 09/12/2016   Procedure: BANDING OF LEFT  ARTERIOVENOUS  FISTULA;  Surgeon: Angelia Mould, MD;  Location: [place];  Service: Vascular;  Laterality: Left;  . LOWER EXTREMITY INTERVENTION Right 12/02/2018   Procedure: LOWER EXTREMITY INTERVENTION;  Surgeon: Algernon Huxley, MD;  Location: [place] [facility];  Service: Cardiovascular;  Laterality: Right;       Family History  Problem Relation Age of Onset  . Diabetes Mother   . Kidney failure Mother   . Healthy Father   . Kidney failure Brother   . Healthy Sister   . Kidney disease Daughter   . Post-traumatic stress disorder Neg Hx   . Bladder Cancer Neg Hx   . Kidney cancer Neg Hx     Social History   Tobacco Use  . Smoking status: Current Every Day Smoker    Packs/day: 0.50    Years: 40.00    Pack years: 20.00    Types: Cigarettes  . Smokeless tobacco: Never Used  Vaping Use  . Vaping Use: Never used  Substance Use Topics  . Alcohol use: Yes    Alcohol/week: 21.0 standard drinks    Types: 21 Cans of beer per week    Comment: last drink 6 months ago  . Drug use: No    Home Medications Prior to Admission medications   Medication Sig Start Date End Date Taking? Authorizing Provider  calcium acetate (PHOSLO) 667 MG capsule Take 2  capsules (1,334 mg total) by mouth 3 (three) times daily with meals. Patient taking differently: Take 2,001 mg by mouth 3 (three) times daily with meals.  02/12/19   Fritzi Mandes, MD  camphor-menthol [facility]) lotion Apply 1 application topically as needed for itching. 11/23/19   Charlann Lange, PA-C  hydrOXYzine (ATARAX/VISTARIL) 50 MG tablet Take 1 tablet (50 mg total) by mouth 3 (three) times daily as needed. 06/04/20   Blake Divine, MD  OLANZapine (ZYPREXA) 2.5 MG tablet Take 1 tablet (2.5 mg total) by mouth at bedtime. 04/10/20   Candee Furbish, MD  patiromer (VELTASSA) 8.4 g packet Take 1 packet (8.4 g total) by mouth daily. 03/14/20   Lorella Nimrod, MD  sevelamer carbonate (RENVELA) 800 MG tablet Take 800 mg by mouth 3 (three) times daily with meals.    [provider]    Allergies    Patient has no known allergies.  Review of Systems   Review of Systems  Respiratory: Positive for shortness of breath.  Gastrointestinal: Positive for abdominal distention.  All other systems reviewed and are negative.   Physical Exam Updated Vital Signs BP (!) 145/92   Pulse 100   Temp 98 F (36.7 C) (Oral)   Resp 20   Ht 5\' 4"  (1.626 m)   Wt (S) 68 kg Comment: this is pt's DRY WEIGHT- per pt  SpO2 97%   BMI 25.73 kg/m   Physical Exam Vitals and nursing note reviewed.  Constitutional:      Appearance: He is well-developed.  HENT:     Head: Normocephalic and atraumatic.  Eyes:     Conjunctiva/sclera: Conjunctivae normal.  Cardiovascular:     Rate and Rhythm: Normal rate and regular rhythm.  Pulmonary:     Effort: Pulmonary effort is normal.     Comments: Fine rales in the bases.  Patient is on 2 L nasal cannula satting well. Abdominal:     General: Bowel sounds are normal. There is distension.     Palpations: Abdomen is soft.     Comments: Abdomen is distended with fluid wave.  Nontender.  Musculoskeletal:     Right lower leg: No edema.     Left lower leg: No edema.   Skin:    General: Skin is warm.  Neurological:     Mental Status: He is alert.  Psychiatric:        Behavior: Behavior normal.     ED Results / Procedures / Treatments   Labs (all labs ordered are listed, but only abnormal results are displayed) Labs Reviewed  COMPREHENSIVE METABOLIC PANEL - Abnormal; Notable for the following components:      Result Value   Potassium 5.6 (*)    CO2 18 (*)    BUN 99 (*)    Creatinine, Ser 18.17 (*)    AST 9 (*)    Alkaline Phosphatase 130 (*)    Total Bilirubin 1.6 (*)    GFR calc non Af Amer 3 (*)    GFR calc Af Amer 3 (*)    Anion gap 23 (*)    All other components within normal limits  CBC WITH DIFFERENTIAL/PLATELET - Abnormal; Notable for the following components:   RBC 2.52 (*)    Hemoglobin 7.5 (*)    HCT 25.2 (*)    MCHC 29.8 (*)    RDW 16.3 (*)    Platelets 143 (*)    All other components within normal limits  SARS CORONAVIRUS 2 BY RT PCR (HOSPITAL ORDER, [facility])  POC SARS CORONAVIRUS 2 AG -  ED    EKG None  Radiology DG Chest 2 View  Result Date: 06/06/2020 CLINICAL DATA:  Shortness of breath EXAM: CHEST - 2 VIEW COMPARISON:  06/04/2020 FINDINGS: Cardiomegaly, vascular congestion. No overt edema. No confluent opacities or effusions. No acute bony abnormality. Aortic atherosclerosis. IMPRESSION: Cardiomegaly, vascular congestion. Electronically Signed   By: Rolm Baptise M.D.   On: 06/06/2020 10:26    Procedures .Critical Care Performed by: Tishara Pizano, [place] N, PA-C Authorized by: Amirra Herling, [place] N, PA-C   Critical care provider statement:    Critical care time (minutes):  45   Critical care time was exclusive of:  Separately billable procedures and treating other patients and teaching time   Critical care was necessary to treat or prevent imminent or life-threatening deterioration of the following conditions:  Respiratory failure and renal failure   Critical care was time spent  personally by me on the following activities:  Discussions with  consultants, evaluation of patient's response to treatment, examination of patient, ordering and performing treatments and interventions, ordering and review of laboratory studies, ordering and review of radiographic studies, pulse oximetry, re-evaluation of patient's condition, obtaining history from patient or surrogate and review of old charts   I assumed direction of critical care for this patient from another provider in my specialty: no     (including critical care time)  Medications Ordered in ED Medications  Chlorhexidine Gluconate Cloth 2 % PADS 6 each (has no administration in time range)  cinacalcet (SENSIPAR) tablet 120 mg (has no administration in time range)  calcitRIOL (ROCALTROL) capsule 1 mcg (has no administration in time range)  hydrOXYzine (ATARAX/VISTARIL) tablet 25 mg (25 mg Oral Given 06/06/20 1519)    ED Course  I have reviewed the triage vital signs and the nursing notes.  Pertinent labs & imaging results that were available during my care of the patient were reviewed by me and considered in my medical decision making (see chart for details).  Clinical Course as of Jun 06 1828  Wed Jun 06, 2020  1339 Discussed with Dr. Osborne Casco with nephrology. Will dialyze patient once COVID test results. If hypoxia resolves patient is appropriate for discharge from nephrology standpoint.   [JR]    Clinical Course User Index [JR] Fabian Walder, [place] N, PA-C   MDM Rules/Calculators/A&P                          Patient presenting to the ED after noncompliance with hemodialysis with worsening shortness of breath and hypoxia.  He is satting in the 80s on room air, improved with O2 nasal cannula.  He does have distended abdomen, fine rales in the bases and chest x-ray with vascular congestion.  Potassium is 5.6.  Discussed with Dr. Osborne Casco with nephrology.  Patient will be urgently dialyzed today.  He is stable for discharge  assuming hypoxia resolves after dialysis.  He is instructed of importance of reporting to dialysis regardless of his chonic pruritus.  Patient discussed with NP Etta Quill.  Patient will need brief reevaluation after returning from dialysis and discharge if stable.  Final Clinical Impression(s) / ED Diagnoses Final diagnoses:  Encounter for hemodialysis [facility])  Hypoxia    Rx / DC Orders ED Discharge Orders    None       Sho Salguero, [place] N, PA-C 06/06/20 1830    Tegeler, Gwenyth Allegra, MD 06/27/20 1520

## 2020-06-06 NOTE — ED Triage Notes (Signed)
To ED via [facility] from home with c/o shortness of breath- increased over past couple days-- has NOT been to dialysis since Friday- "due to itching" also did not get a full treatment on Friday. States atarax and benadryl does not help the itching.  O2 sats for EMS = 87% on RA, increased on 2L/M/[place] to 94%-- O2 initially here on RA - 87-88%- placed on 2L/[place] sats 96%-- continuous itching, abd distended. Last BM 2 hours ago Dialysis in [place], makes very little urine

## 2020-06-06 NOTE — ED Notes (Addendum)
Pt back from dialysis, seen leaving the ED department. Pt asked to stay and be seen by MD again but refused to stay

## 2020-06-06 NOTE — ED Notes (Signed)
Report given to dialysisJana Half, RN

## 2020-06-06 NOTE — ED Notes (Signed)
Pt continues to remove monitor - ambulatory to bathroom, reminded to place self back on O2.

## 2020-06-06 NOTE — Treatment Plan (Signed)
54 year old male ESRD Monday Wednesday Friday at [facility] frequently misses treatments.  Has missed 2 treatments in the past 7 days.  Last dialyzed on Friday.  Comes in with shortness of breath and chest x-ray with volume overload and pulmonary edema.  Potassium is 5.7.  Patient has hypoxia.  Patient also has itching which is ongoing for a long time.  Patient will require dialysis today.  We will arrange for dialysis in the dialysis unit after Covid returns negative.  At that time and the patient's hypoxia has resolved he can likely DC from the emergency department from a renal standpoint.  We will plan for dialysis per outpatient prescription.  Also give Sensipar 120 mg and calcitriol 1 MCG.

## 2020-06-06 NOTE — ED Provider Notes (Signed)
Patient returned to the department after completing dialysis. Patient left before being reevaluated. Patient reportedly exited the department under his own power, and would not wait when approached by staff.   Etta Quill, NP 06/06/20 2026    Carmin Muskrat, MD 06/06/20 [phone]

## 2020-06-08 DIAGNOSIS — N186 End stage renal disease: Secondary | ICD-10-CM | POA: Diagnosis not present

## 2020-06-08 DIAGNOSIS — N2581 Secondary hyperparathyroidism of renal origin: Secondary | ICD-10-CM | POA: Diagnosis not present

## 2020-06-08 DIAGNOSIS — Z992 Dependence on renal dialysis: Secondary | ICD-10-CM | POA: Diagnosis not present

## 2020-06-08 DIAGNOSIS — D509 Iron deficiency anemia, unspecified: Secondary | ICD-10-CM | POA: Diagnosis not present

## 2020-06-08 DIAGNOSIS — R52 Pain, unspecified: Secondary | ICD-10-CM | POA: Diagnosis not present

## 2020-06-08 DIAGNOSIS — L299 Pruritus, unspecified: Secondary | ICD-10-CM | POA: Diagnosis not present

## 2020-06-11 DIAGNOSIS — N186 End stage renal disease: Secondary | ICD-10-CM | POA: Diagnosis not present

## 2020-06-11 DIAGNOSIS — D509 Iron deficiency anemia, unspecified: Secondary | ICD-10-CM | POA: Diagnosis not present

## 2020-06-11 DIAGNOSIS — N2581 Secondary hyperparathyroidism of renal origin: Secondary | ICD-10-CM | POA: Diagnosis not present

## 2020-06-11 DIAGNOSIS — Z992 Dependence on renal dialysis: Secondary | ICD-10-CM | POA: Diagnosis not present

## 2020-06-11 DIAGNOSIS — R52 Pain, unspecified: Secondary | ICD-10-CM | POA: Diagnosis not present

## 2020-06-11 DIAGNOSIS — L299 Pruritus, unspecified: Secondary | ICD-10-CM | POA: Diagnosis not present

## 2020-06-12 ENCOUNTER — Telehealth: Payer: Self-pay | Admitting: *Deleted

## 2020-06-12 NOTE — Telephone Encounter (Signed)
He has consistently missed appointments. I am not sending him in anything without evaluating him in the office.

## 2020-06-12 NOTE — Telephone Encounter (Signed)
Pt called in asking PCP to send in a med for itching. Pt said he's has scratched so hard that he has scratched his skin off. I advise usually he would need to be seen before we could prescribe something but pt said he has dialysis tomorrow and has missed a few appts so can't come in and wants meds sent in. As pt was saying this his phone hung up. Called # on file and it was off.   FYI to PCP

## 2020-06-14 ENCOUNTER — Ambulatory Visit: Payer: Medicare Other | Admitting: Internal Medicine

## 2020-06-15 DIAGNOSIS — Z992 Dependence on renal dialysis: Secondary | ICD-10-CM | POA: Diagnosis not present

## 2020-06-15 DIAGNOSIS — N186 End stage renal disease: Secondary | ICD-10-CM | POA: Diagnosis not present

## 2020-06-15 DIAGNOSIS — N2581 Secondary hyperparathyroidism of renal origin: Secondary | ICD-10-CM | POA: Diagnosis not present

## 2020-06-15 DIAGNOSIS — D509 Iron deficiency anemia, unspecified: Secondary | ICD-10-CM | POA: Diagnosis not present

## 2020-06-15 DIAGNOSIS — L299 Pruritus, unspecified: Secondary | ICD-10-CM | POA: Diagnosis not present

## 2020-06-15 DIAGNOSIS — R52 Pain, unspecified: Secondary | ICD-10-CM | POA: Diagnosis not present

## 2020-06-18 ENCOUNTER — Other Ambulatory Visit: Payer: Self-pay

## 2020-06-18 DIAGNOSIS — J811 Chronic pulmonary edema: Secondary | ICD-10-CM | POA: Diagnosis not present

## 2020-06-18 DIAGNOSIS — J810 Acute pulmonary edema: Principal | ICD-10-CM | POA: Insufficient documentation

## 2020-06-18 DIAGNOSIS — F17210 Nicotine dependence, cigarettes, uncomplicated: Secondary | ICD-10-CM | POA: Diagnosis not present

## 2020-06-18 DIAGNOSIS — I517 Cardiomegaly: Secondary | ICD-10-CM | POA: Diagnosis not present

## 2020-06-18 DIAGNOSIS — N186 End stage renal disease: Secondary | ICD-10-CM | POA: Insufficient documentation

## 2020-06-18 DIAGNOSIS — Z20822 Contact with and (suspected) exposure to covid-19: Secondary | ICD-10-CM | POA: Insufficient documentation

## 2020-06-18 DIAGNOSIS — R6889 Other general symptoms and signs: Secondary | ICD-10-CM | POA: Diagnosis not present

## 2020-06-18 DIAGNOSIS — Z992 Dependence on renal dialysis: Secondary | ICD-10-CM | POA: Insufficient documentation

## 2020-06-18 DIAGNOSIS — Z743 Need for continuous supervision: Secondary | ICD-10-CM | POA: Diagnosis not present

## 2020-06-18 DIAGNOSIS — R0689 Other abnormalities of breathing: Secondary | ICD-10-CM | POA: Diagnosis not present

## 2020-06-18 DIAGNOSIS — I132 Hypertensive heart and chronic kidney disease with heart failure and with stage 5 chronic kidney disease, or end stage renal disease: Secondary | ICD-10-CM | POA: Diagnosis not present

## 2020-06-18 DIAGNOSIS — I5043 Acute on chronic combined systolic (congestive) and diastolic (congestive) heart failure: Secondary | ICD-10-CM | POA: Diagnosis not present

## 2020-06-18 DIAGNOSIS — R0602 Shortness of breath: Secondary | ICD-10-CM | POA: Diagnosis not present

## 2020-06-18 DIAGNOSIS — I120 Hypertensive chronic kidney disease with stage 5 chronic kidney disease or end stage renal disease: Secondary | ICD-10-CM | POA: Diagnosis not present

## 2020-06-18 DIAGNOSIS — R188 Other ascites: Secondary | ICD-10-CM | POA: Diagnosis not present

## 2020-06-18 DIAGNOSIS — R109 Unspecified abdominal pain: Secondary | ICD-10-CM | POA: Diagnosis not present

## 2020-06-18 LAB — COMPREHENSIVE METABOLIC PANEL
ALT: 10 U/L (ref 0–44)
AST: 16 U/L (ref 15–41)
Albumin: 4 g/dL (ref 3.5–5.0)
Alkaline Phosphatase: 167 U/L — ABNORMAL HIGH (ref 38–126)
Anion gap: 18 — ABNORMAL HIGH (ref 5–15)
BUN: 76 mg/dL — ABNORMAL HIGH (ref 6–20)
CO2: 24 mmol/L (ref 22–32)
Calcium: 8.8 mg/dL — ABNORMAL LOW (ref 8.9–10.3)
Chloride: 97 mmol/L — ABNORMAL LOW (ref 98–111)
Creatinine, Ser: 13.52 mg/dL — ABNORMAL HIGH (ref 0.61–1.24)
GFR calc Af Amer: 4 mL/min — ABNORMAL LOW (ref >60)
GFR calc non Af Amer: 4 mL/min — ABNORMAL LOW (ref >60)
Glucose, Bld: 107 mg/dL — ABNORMAL HIGH (ref 70–99)
Potassium: 4.7 mmol/L (ref 3.5–5.1)
Sodium: 139 mmol/L (ref 135–145)
Total Bilirubin: 1.9 mg/dL — ABNORMAL HIGH (ref 0.3–1.2)
Total Protein: 8.1 g/dL (ref 6.5–8.1)

## 2020-06-18 LAB — LIPASE, BLOOD: Lipase: 49 U/L (ref 11–51)

## 2020-06-18 LAB — CBC
HCT: 23.8 % — ABNORMAL LOW (ref 39.0–52.0)
Hemoglobin: 7.6 g/dL — ABNORMAL LOW (ref 13.0–17.0)
MCH: 30.8 pg (ref 26.0–34.0)
MCHC: 31.9 g/dL (ref 30.0–36.0)
MCV: 96.4 fL (ref 80.0–100.0)
Platelets: 164 10*3/uL (ref 150–400)
RBC: 2.47 MIL/uL — ABNORMAL LOW (ref 4.22–5.81)
RDW: 16.1 % — ABNORMAL HIGH (ref 11.5–15.5)
WBC: 9.6 10*3/uL (ref 4.0–10.5)
nRBC: 0.4 % — ABNORMAL HIGH (ref 0.0–0.2)

## 2020-06-18 LAB — ETHANOL: Alcohol, Ethyl (B): <10 mg/dL (ref <10)

## 2020-06-18 NOTE — ED Triage Notes (Signed)
Pt in with co RUQ that started today states around 2000 no hx of the same. Pt denies any n.v.d at this time, did miss his dialysis treatment today. Last treatment was Friday.

## 2020-06-19 ENCOUNTER — Emergency Department: Payer: Medicare Other

## 2020-06-19 ENCOUNTER — Ambulatory Visit: Payer: Medicare Other | Admitting: Internal Medicine

## 2020-06-19 ENCOUNTER — Observation Stay
Admission: EM | Admit: 2020-06-19 | Discharge: 2020-06-20 | Disposition: A | Discharge status: Home / Self Care | Payer: Medicare Other | Attending: Hospitalist | Admitting: Hospitalist

## 2020-06-19 DIAGNOSIS — J811 Chronic pulmonary edema: Secondary | ICD-10-CM | POA: Diagnosis not present

## 2020-06-19 DIAGNOSIS — E8770 Fluid overload, unspecified: Secondary | ICD-10-CM | POA: Diagnosis not present

## 2020-06-19 DIAGNOSIS — R188 Other ascites: Secondary | ICD-10-CM | POA: Diagnosis not present

## 2020-06-19 DIAGNOSIS — J9601 Acute respiratory failure with hypoxia: Secondary | ICD-10-CM | POA: Diagnosis not present

## 2020-06-19 DIAGNOSIS — J9621 Acute and chronic respiratory failure with hypoxia: Secondary | ICD-10-CM | POA: Diagnosis present

## 2020-06-19 DIAGNOSIS — I509 Heart failure, unspecified: Secondary | ICD-10-CM

## 2020-06-19 DIAGNOSIS — N2581 Secondary hyperparathyroidism of renal origin: Secondary | ICD-10-CM | POA: Diagnosis not present

## 2020-06-19 DIAGNOSIS — J810 Acute pulmonary edema: Secondary | ICD-10-CM | POA: Diagnosis not present

## 2020-06-19 DIAGNOSIS — D631 Anemia in chronic kidney disease: Secondary | ICD-10-CM | POA: Diagnosis not present

## 2020-06-19 DIAGNOSIS — I5023 Acute on chronic systolic (congestive) heart failure: Secondary | ICD-10-CM

## 2020-06-19 DIAGNOSIS — R109 Unspecified abdominal pain: Secondary | ICD-10-CM | POA: Diagnosis present

## 2020-06-19 DIAGNOSIS — N186 End stage renal disease: Secondary | ICD-10-CM | POA: Diagnosis not present

## 2020-06-19 DIAGNOSIS — R0602 Shortness of breath: Secondary | ICD-10-CM | POA: Diagnosis not present

## 2020-06-19 DIAGNOSIS — F1010 Alcohol abuse, uncomplicated: Secondary | ICD-10-CM | POA: Diagnosis present

## 2020-06-19 DIAGNOSIS — I10 Essential (primary) hypertension: Secondary | ICD-10-CM | POA: Diagnosis not present

## 2020-06-19 DIAGNOSIS — I517 Cardiomegaly: Secondary | ICD-10-CM | POA: Diagnosis not present

## 2020-06-19 DIAGNOSIS — Z720 Tobacco use: Secondary | ICD-10-CM | POA: Diagnosis present

## 2020-06-19 DIAGNOSIS — Z992 Dependence on renal dialysis: Secondary | ICD-10-CM

## 2020-06-19 LAB — RESPIRATORY PANEL BY RT PCR (FLU A&B, COVID)
Influenza A by PCR: NEGATIVE
Influenza B by PCR: NEGATIVE
SARS Coronavirus 2 by RT PCR: NEGATIVE

## 2020-06-19 IMAGING — CR DG CHEST 2V
1 series · 2 of 2 positions shown · non-contrast
Comparison: [DATE]

CLINICAL DATA: Shortness of breath

EXAM:
CHEST - 2 VIEW

[Series 1: dg chest 2 view · 0.14mm/px · 2 of 2 slices shown]
[im 1/2]
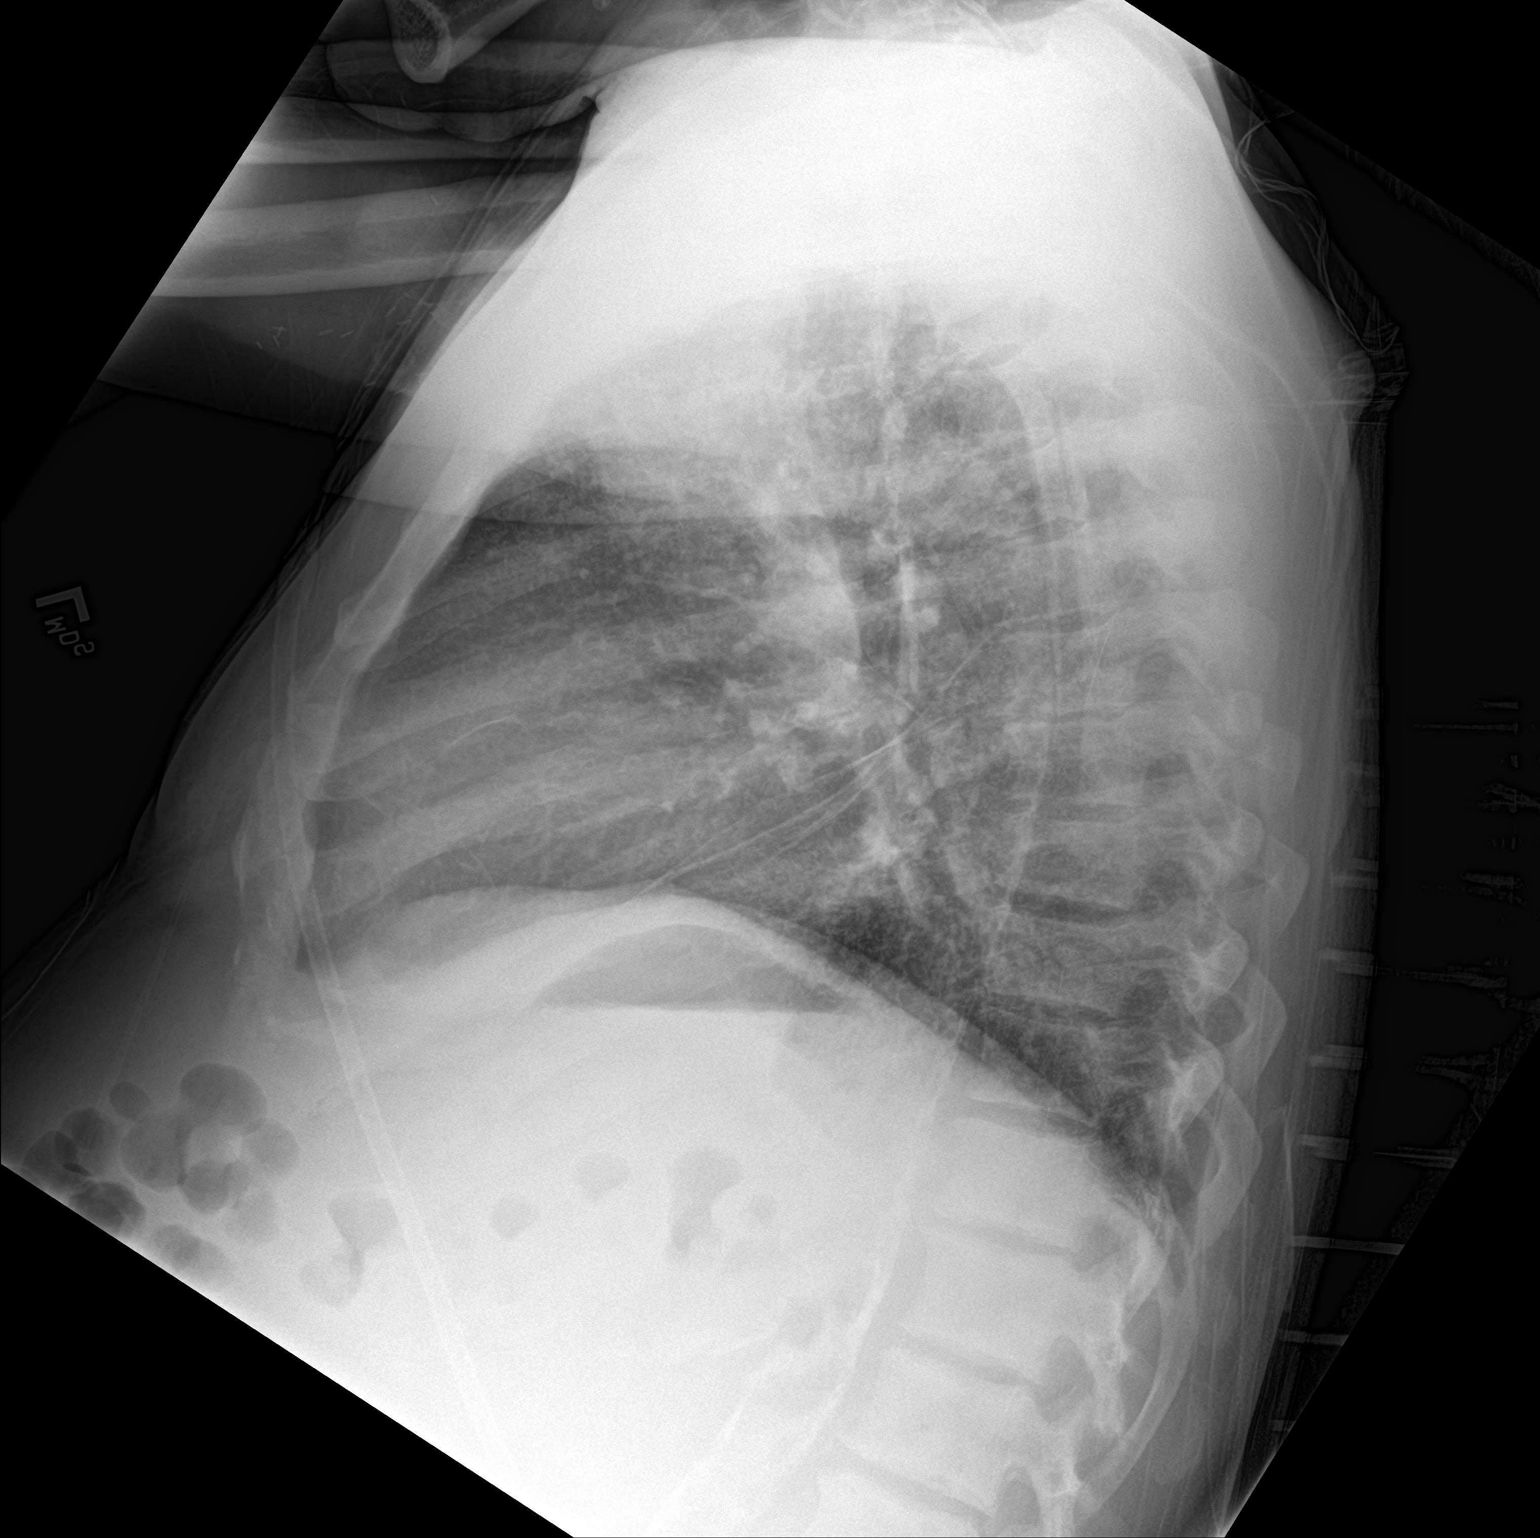
[im 2/2]
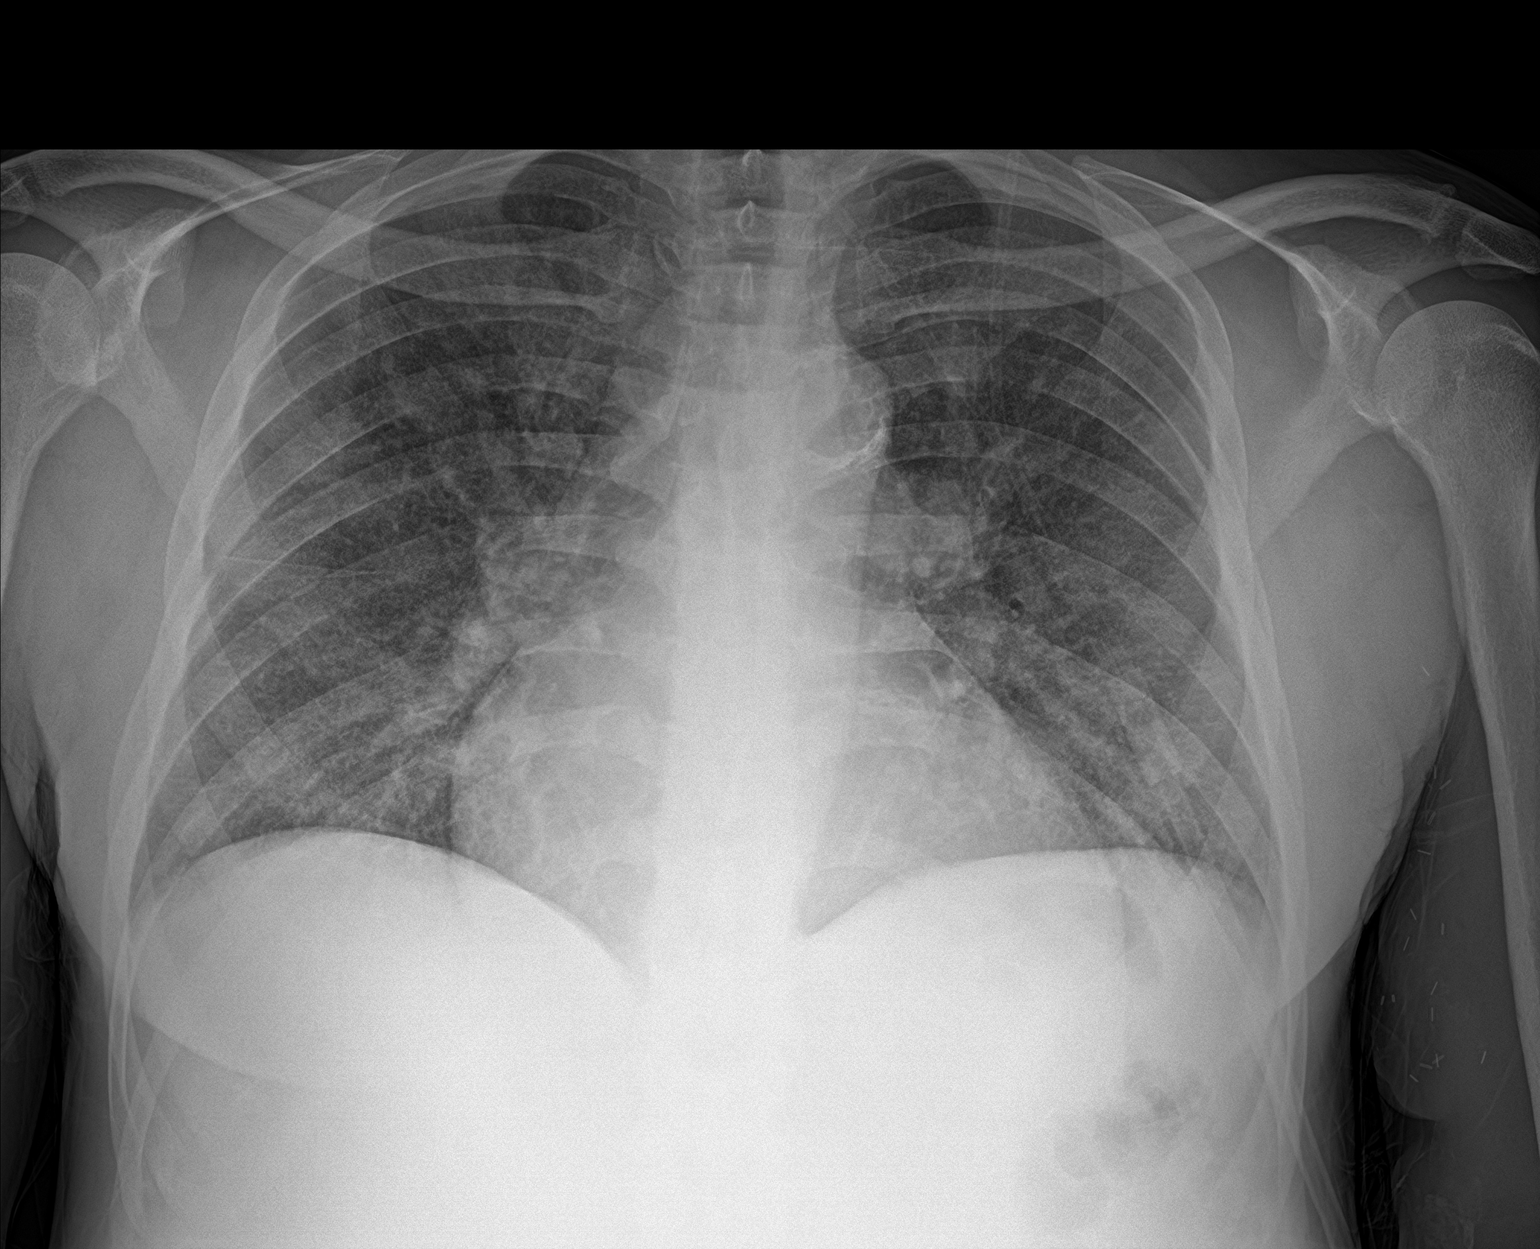

[2 of 2 positions shown; findings below may reference images not displayed]

FINDINGS: Stable mild cardiomegaly. Atherosclerotic calcification of the
thoracic aorta. Pulmonary vascular congestion with mild diffuse
interstitial opacities throughout both lungs. No pleural effusion.
No pneumothorax.
IMPRESSION: Findings suggestive of CHF with mild interstitial edema.

## 2020-06-19 IMAGING — US US ABDOMEN LIMITED
1 series · 14 of 25 positions shown · non-contrast
Comparison: None.

CLINICAL DATA: Right upper quadrant pain

EXAM:
ULTRASOUND ABDOMEN LIMITED RIGHT UPPER QUADRANT

[Series 1: us abdomen limited ruq · 14 of 42 slices shown]
[im 1/42]
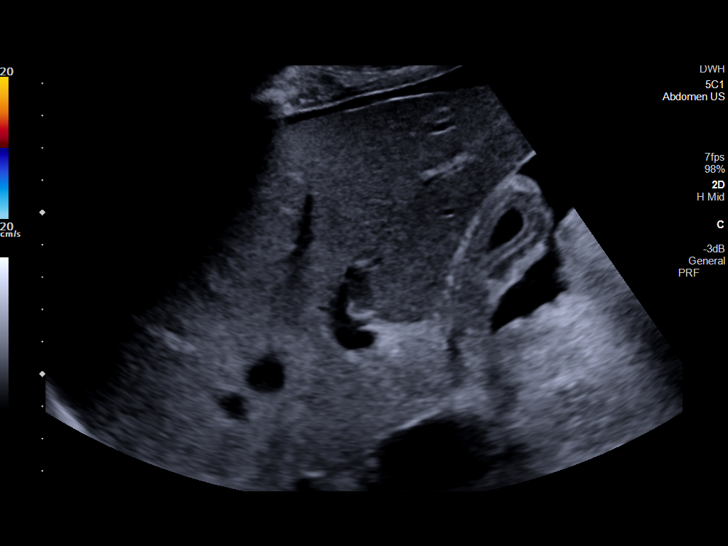
[im 4/42]
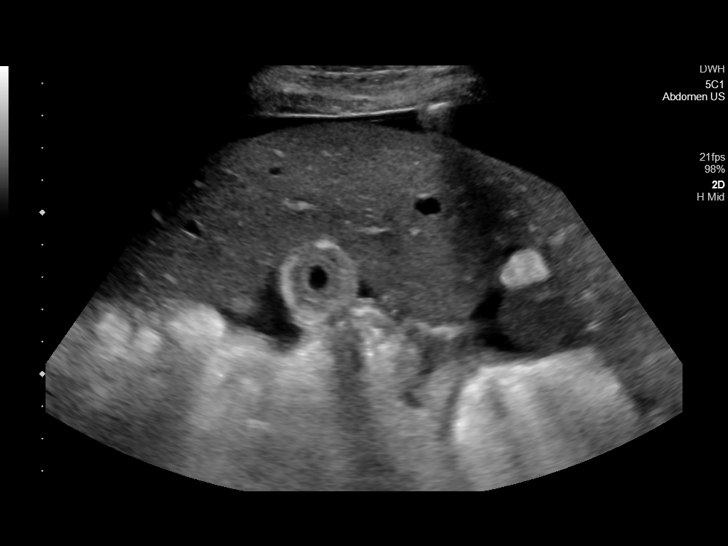
[im 7/42]
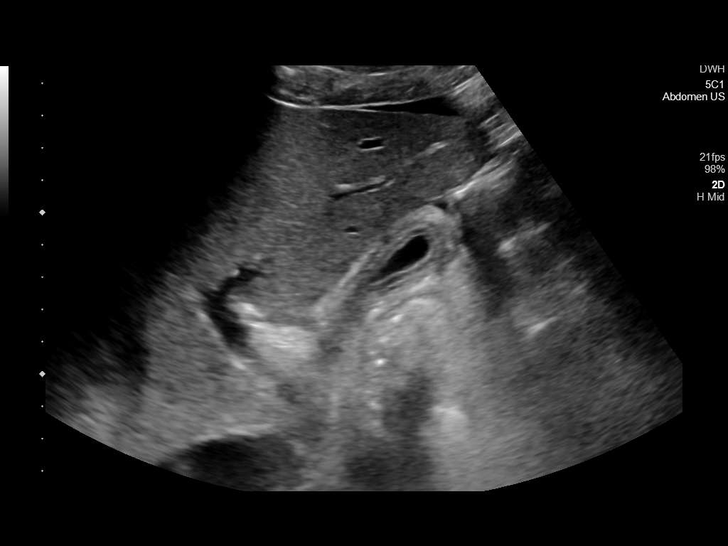
[im 11/42]
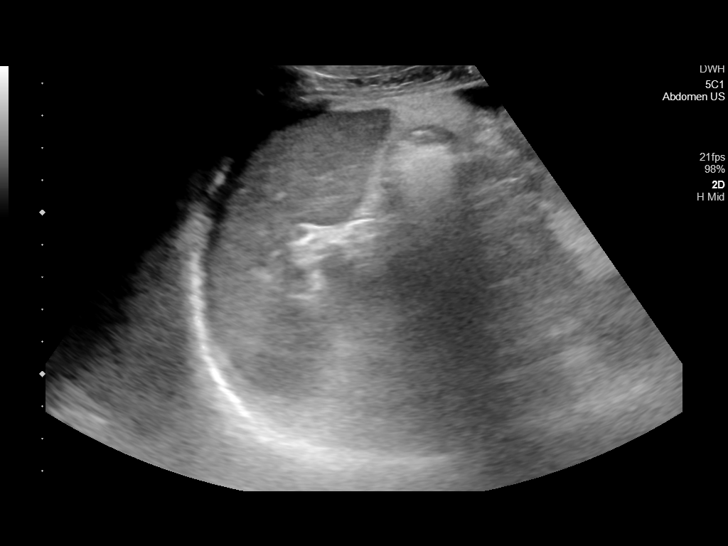
[im 14/42]
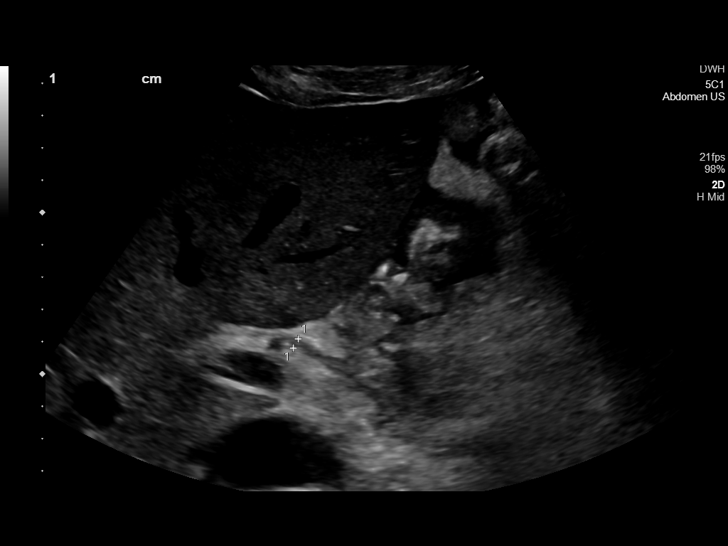
[im 16/42]
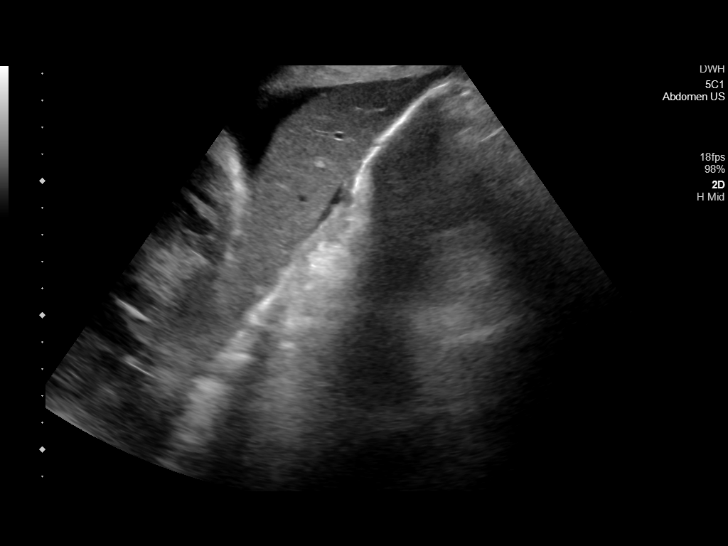
[im 19/42]
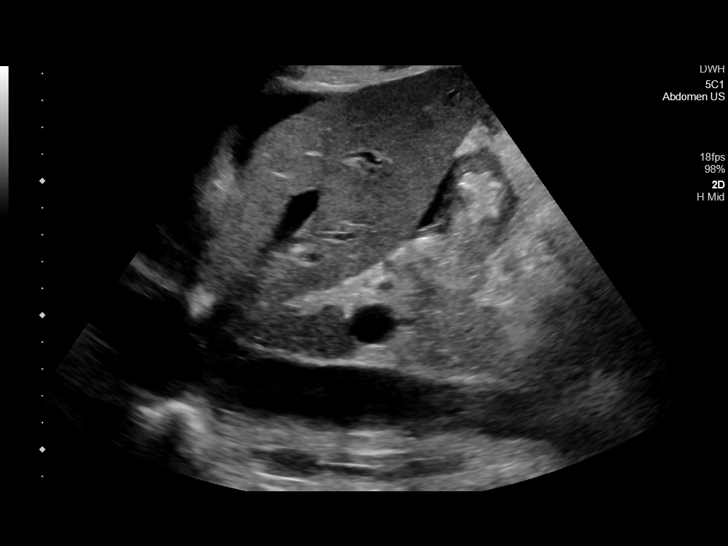
[im 23/42]
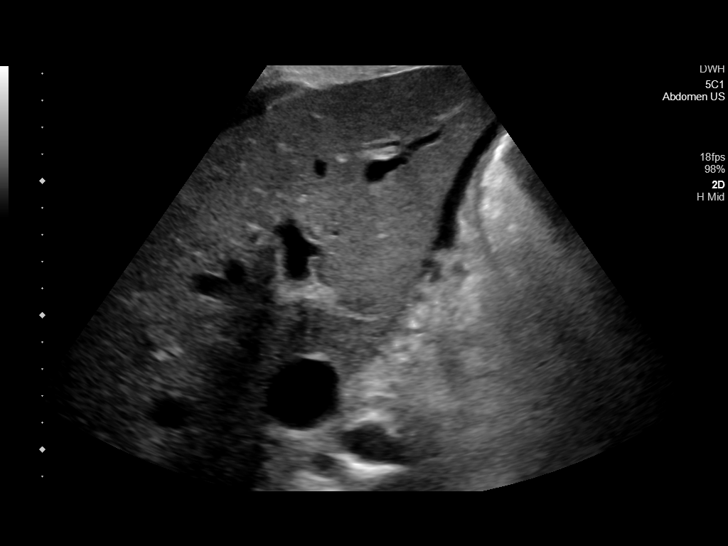
[im 26/42]
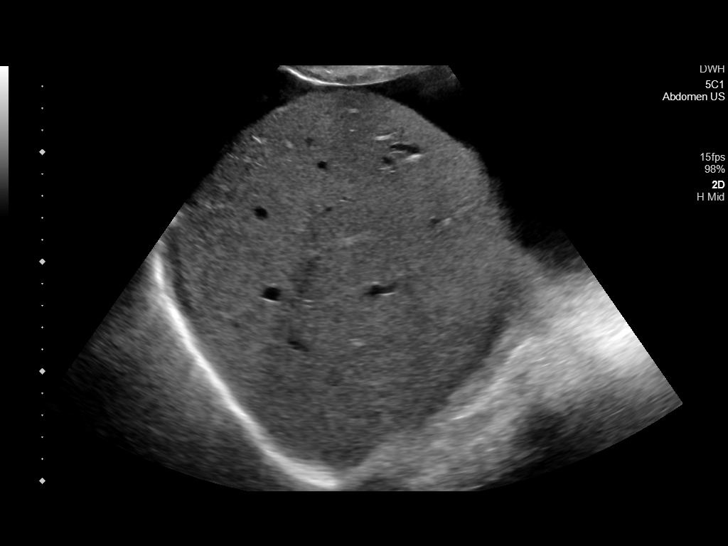
[im 28/42]
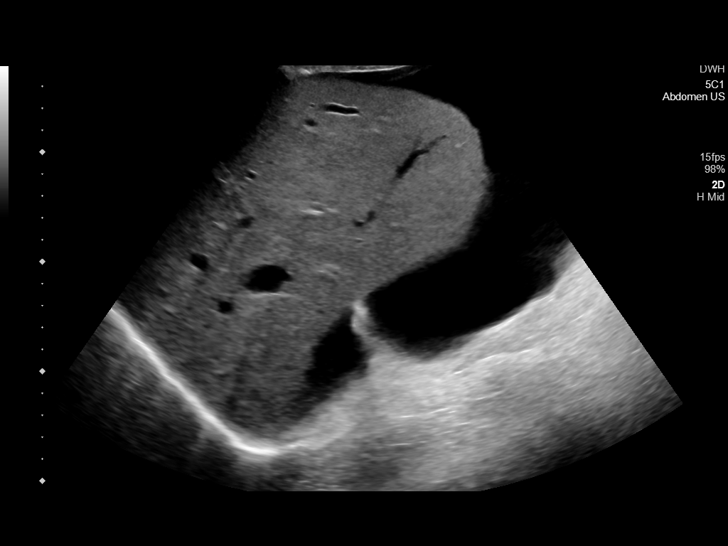
[im 31/42]
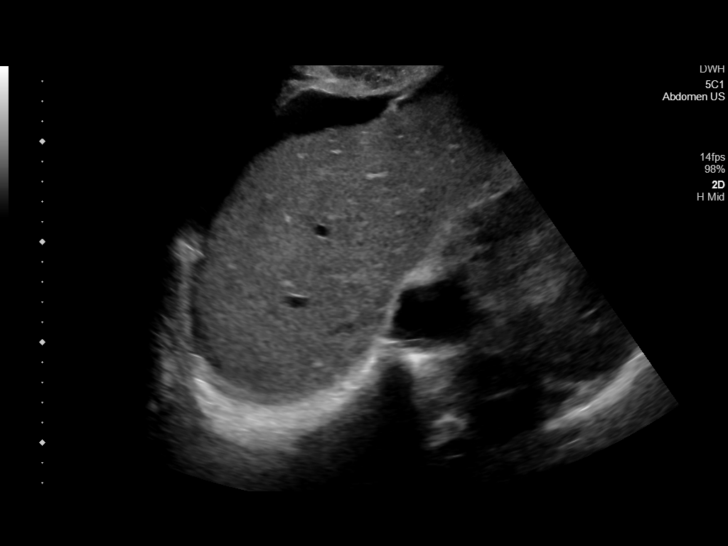
[im 35/42]
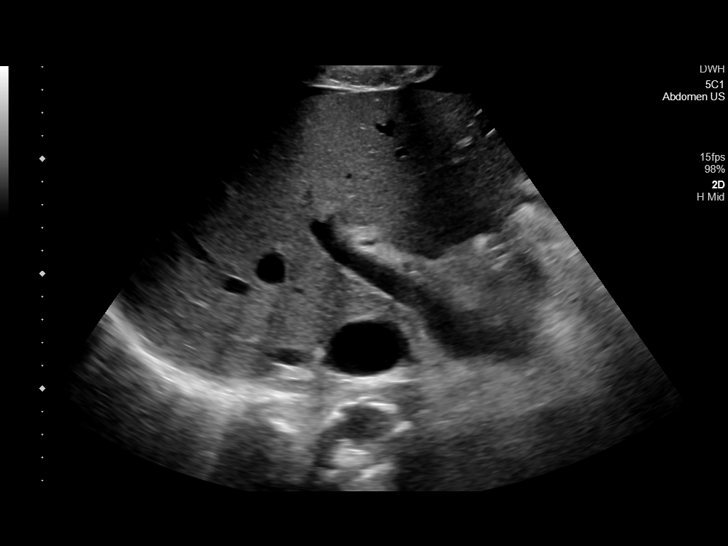
[im 38/42]
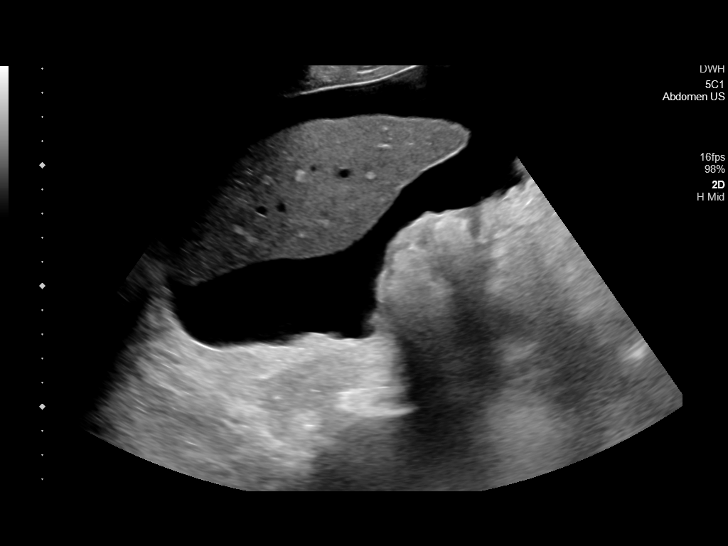
[im 42/42]
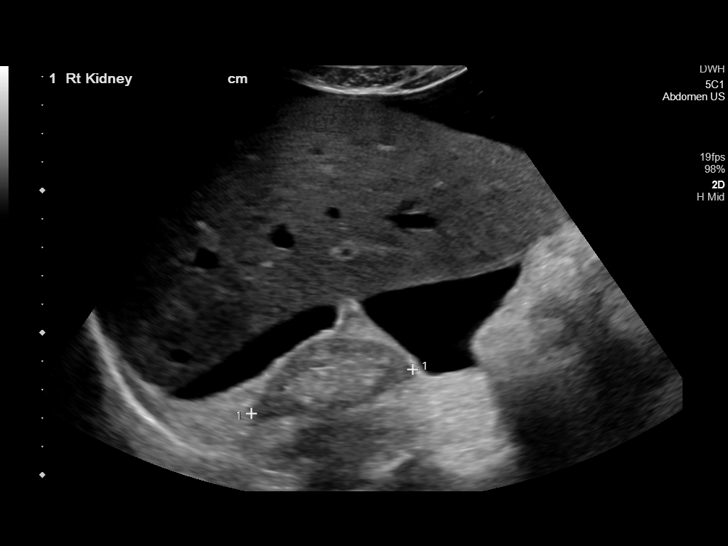

[14 of 25 positions shown; findings below may reference images not displayed]

FINDINGS: Gallbladder:

The gallbladder is contracted. No visible stones. Wall thickness
measures 7 mm but is probably exaggerated by underdistention. A
negative sonographic Murphy sign was reported by the sonographer. No
pericholecystic fluid.

Common bile duct:

Diameter: 3 mm

Liver:

No focal lesion identified. Within normal limits in parenchymal
echogenicity. Portal vein is patent on color Doppler imaging with
normal direction of blood flow towards the liver.

Other: There is ascites within all quadrants. The right kidney is
atrophic.
IMPRESSION: 1. Contracted gallbladder without evidence of acute cholecystitis.
2. Ascites within all quadrants.

## 2020-06-19 MED ORDER — THIAMINE HCL 100 MG PO TABS
100.0000 mg | ORAL_TABLET | Freq: Every day | ORAL | Status: DC
  Administered 2020-06-19: 100 mg via ORAL
  Filled 2020-06-19: qty 1

## 2020-06-19 MED ORDER — LORAZEPAM 2 MG/ML IJ SOLN: 0.0000 mg | Freq: Two times a day (BID) | INTRAMUSCULAR | Status: DC

## 2020-06-19 MED ORDER — ONDANSETRON HCL 4 MG/2ML IJ SOLN: 4.0000 mg | Freq: Three times a day (TID) | INTRAMUSCULAR | Status: DC | PRN

## 2020-06-19 MED ORDER — DM-GUAIFENESIN ER 30-600 MG PO TB12: 1.0000 | ORAL_TABLET | Freq: Two times a day (BID) | ORAL | Status: DC | PRN

## 2020-06-19 MED ORDER — CHLORHEXIDINE GLUCONATE CLOTH 2 % EX PADS
6.0000 | MEDICATED_PAD | Freq: Every day | CUTANEOUS | Status: DC
  Filled 2020-06-19: qty 6

## 2020-06-19 MED ORDER — THIAMINE HCL 100 MG/ML IJ SOLN
100.0000 mg | Freq: Every day | INTRAMUSCULAR | Status: DC
  Filled 2020-06-19: qty 2

## 2020-06-19 MED ORDER — SODIUM CHLORIDE 0.9 % IV SOLN: 250.0000 mL | INTRAVENOUS | Status: DC | PRN

## 2020-06-19 MED ORDER — SODIUM CHLORIDE 0.9% FLUSH: 3.0000 mL | Freq: Two times a day (BID) | INTRAVENOUS | Status: DC

## 2020-06-19 MED ORDER — ALBUTEROL SULFATE HFA 108 (90 BASE) MCG/ACT IN AERS
2.0000 | INHALATION_SPRAY | RESPIRATORY_TRACT | Status: DC | PRN
  Filled 2020-06-19: qty 6.7

## 2020-06-19 MED ORDER — SEVELAMER CARBONATE 800 MG PO TABS
800.0000 mg | ORAL_TABLET | Freq: Three times a day (TID) | ORAL | Status: DC
  Administered 2020-06-20 (×2): 800 mg via ORAL
  Filled 2020-06-19 (×4): qty 1

## 2020-06-19 MED ORDER — MORPHINE SULFATE (PF) 2 MG/ML IV SOLN: 2.0000 mg | INTRAVENOUS | Status: DC | PRN

## 2020-06-19 MED ORDER — LORAZEPAM 1 MG PO TABS: 1.0000 mg | ORAL_TABLET | ORAL | Status: DC | PRN

## 2020-06-19 MED ORDER — LORAZEPAM 2 MG/ML IJ SOLN: 1.0000 mg | INTRAMUSCULAR | Status: DC | PRN

## 2020-06-19 MED ORDER — HYDRALAZINE HCL 20 MG/ML IJ SOLN: 5.0000 mg | INTRAMUSCULAR | Status: DC | PRN

## 2020-06-19 MED ORDER — LISINOPRIL 10 MG PO TABS
5.0000 mg | ORAL_TABLET | Freq: Every day | ORAL | Status: DC
  Administered 2020-06-19: 5 mg via ORAL
  Filled 2020-06-19: qty 1

## 2020-06-19 MED ORDER — DIPHENHYDRAMINE HCL 50 MG/ML IJ SOLN
25.0000 mg | Freq: Once | INTRAMUSCULAR | Status: AC
  Administered 2020-06-19: 25 mg via INTRAVENOUS
  Filled 2020-06-19: qty 1

## 2020-06-19 MED ORDER — NICOTINE 21 MG/24HR TD PT24
21.0000 mg | MEDICATED_PATCH | Freq: Every day | TRANSDERMAL | Status: DC
  Administered 2020-06-19: 21 mg via TRANSDERMAL
  Filled 2020-06-19: qty 1

## 2020-06-19 MED ORDER — ASPIRIN EC 81 MG PO TBEC
81.0000 mg | DELAYED_RELEASE_TABLET | Freq: Every day | ORAL | Status: DC
  Administered 2020-06-19: 81 mg via ORAL
  Filled 2020-06-19: qty 1

## 2020-06-19 MED ORDER — OLANZAPINE 2.5 MG PO TABS
2.5000 mg | ORAL_TABLET | Freq: Every day | ORAL | Status: DC
  Administered 2020-06-19: 2.5 mg via ORAL
  Filled 2020-06-19 (×2): qty 1

## 2020-06-19 MED ORDER — SODIUM CHLORIDE 0.9% FLUSH: 3.0000 mL | INTRAVENOUS | Status: DC | PRN

## 2020-06-19 MED ORDER — FUROSEMIDE 10 MG/ML IJ SOLN
120.0000 mg | Freq: Once | INTRAVENOUS | Status: AC
  Administered 2020-06-19: 120 mg via INTRAVENOUS
  Filled 2020-06-19: qty 12

## 2020-06-19 MED ORDER — EPOETIN ALFA 4000 UNIT/ML IJ SOLN
4000.0000 [IU] | INTRAMUSCULAR | Status: DC
  Administered 2020-06-20: 4000 [IU] via INTRAVENOUS
  Filled 2020-06-19: qty 1

## 2020-06-19 MED ORDER — LORAZEPAM 2 MG/ML IJ SOLN: 0.0000 mg | Freq: Four times a day (QID) | INTRAMUSCULAR | Status: DC

## 2020-06-19 MED ORDER — CALCIUM ACETATE (PHOS BINDER) 667 MG PO CAPS
1334.0000 mg | ORAL_CAPSULE | Freq: Three times a day (TID) | ORAL | Status: DC
  Administered 2020-06-20 (×2): 1334 mg via ORAL
  Filled 2020-06-19 (×4): qty 2

## 2020-06-19 MED ORDER — ADULT MULTIVITAMIN W/MINERALS CH
1.0000 | ORAL_TABLET | Freq: Every day | ORAL | Status: DC
  Administered 2020-06-19: 1 via ORAL
  Filled 2020-06-19: qty 1

## 2020-06-19 MED ORDER — FOLIC ACID 1 MG PO TABS
1.0000 mg | ORAL_TABLET | Freq: Every day | ORAL | Status: DC
  Administered 2020-06-19: 1 mg via ORAL
  Filled 2020-06-19: qty 1

## 2020-06-19 MED ORDER — HYDROXYZINE HCL 25 MG PO TABS
50.0000 mg | ORAL_TABLET | Freq: Three times a day (TID) | ORAL | Status: DC | PRN
  Administered 2020-06-20 (×2): 50 mg via ORAL
  Filled 2020-06-19 (×2): qty 2

## 2020-06-19 MED ORDER — ACETAMINOPHEN 325 MG PO TABS: 650.0000 mg | ORAL_TABLET | Freq: Four times a day (QID) | ORAL | Status: DC | PRN

## 2020-06-19 MED ORDER — HEPARIN SODIUM (PORCINE) 5000 UNIT/ML IJ SOLN
5000.0000 [IU] | Freq: Three times a day (TID) | INTRAMUSCULAR | Status: DC
  Administered 2020-06-19 – 2020-06-20 (×2): 5000 [IU] via SUBCUTANEOUS
  Filled 2020-06-19 (×2): qty 1

## 2020-06-19 NOTE — ED Notes (Signed)
Attempted PIV x2 in right arm without success.

## 2020-06-19 NOTE — ED Notes (Signed)
Pt on 2L

## 2020-06-19 NOTE — Progress Notes (Signed)
[facility], [place] 06/19/20  Subjective:   LOS: 0  Patient known to our practice from previous admissions.  Presents for abdominal fullness leading to shortness of breath.  Missed his hemodialysis on Monday because of excessive itching Last HD was on Friday Patient was noted to have desaturations on room air therefore Admitted for further evaluation  Objective:  Vital signs in last 24 hours:  Temp:  [97.6 F (36.4 C)-98.2 F (36.8 C)] 97.6 F (36.4 C) (09/28 1328) Pulse Rate:  [88-101] 88 (09/28 1328) Resp:  [18-24] 18 (09/28 1328) BP: (147-182)/(95-114) 164/114 (09/28 1328) SpO2:  [95 %-100 %] 100 % (09/28 1328) Weight:  [72.6 kg] 72.6 kg (09/27 2315)  Weight change:  Filed Weights   06/18/20 2315  Weight: 72.6 kg    Intake/Output:   No intake or output data in the 24 hours ending 06/19/20 1533  Physical Exam: General:  No acute distress, laying in the bed  HEENT  anicteric, moist oral mucous membrane  Pulm/lungs  normal breathing effort, lungs are clear to auscultation  CVS/Heart  regular rhythm, no rub or gallop  Abdomen:   Soft, nontender  Extremities:  No peripheral edema  Neurologic:  Alert, oriented, able to follow commands  Access:  Left Arm AVF      Basic Metabolic Panel:  Recent Labs  Lab 06/18/20 2319  NA 139  K 4.7  CL 97*  CO2 24  GLUCOSE 107*  BUN 76*  CREATININE 13.52*  CALCIUM 8.8*     CBC: Recent Labs  Lab 06/18/20 2319  WBC 9.6  HGB 7.6*  HCT 23.8*  MCV 96.4  PLT 164      Lab Results  Component Value Date   HEPBSAG NON REACTIVE 03/21/2020   HEPBSAB Reactive 08/08/2016   HEPBIGM EQUIVOCAL (A) 10/22/2019      Microbiology:  No results found for this or any previous visit (from the past 240 hour(s)).  Coagulation Studies: No results for input(s): LABPROT, INR in the last 72 hours.  Urinalysis: No results for input(s): COLORURINE, LABSPEC, PHURINE, GLUCOSEU, HGBUR, BILIRUBINUR,  KETONESUR, PROTEINUR, UROBILINOGEN, NITRITE, LEUKOCYTESUR in the last 72 hours.  Invalid input(s): APPERANCEUR    Imaging: DG Chest 2 View  Result Date: 06/19/2020 CLINICAL DATA:  Shortness of breath EXAM: CHEST - 2 VIEW COMPARISON:  06/06/2020 FINDINGS: Stable mild cardiomegaly. Atherosclerotic calcification of the thoracic aorta. Pulmonary vascular congestion with mild diffuse interstitial opacities throughout both lungs. No pleural effusion. No pneumothorax. IMPRESSION: Findings suggestive of CHF with mild interstitial edema. Electronically Signed   By: Davina Poke D.O.   On: 06/19/2020 15:20   US ABDOMEN LIMITED RUQ  Result Date: 06/19/2020 CLINICAL DATA:  Right upper quadrant pain EXAM: ULTRASOUND ABDOMEN LIMITED RIGHT UPPER QUADRANT COMPARISON:  None. FINDINGS: Gallbladder: The gallbladder is contracted. No visible stones. Wall thickness measures 7 mm but is probably exaggerated by underdistention. A negative sonographic Percell Miller sign was reported by the sonographer. No pericholecystic fluid. Common bile duct: Diameter: 3 mm Liver: No focal lesion identified. Within normal limits in parenchymal echogenicity. Portal vein is patent on color Doppler imaging with normal direction of blood flow towards the liver. Other: There is ascites within all quadrants. The right kidney is atrophic. IMPRESSION: 1. Contracted gallbladder without evidence of acute cholecystitis. 2. Ascites within all quadrants. Electronically Signed   By: Ulyses Jarred M.D.   On: 06/19/2020 02:09     Medications:   . furosemide 120 mg (06/19/20 1517)   .  nicotine  21 mg Transdermal Daily     Assessment/ Plan:  54 y.o. male with end stage renal disease on hemodialysis, alcohol abuse, erectile dysfunction, hypertension atrial fibrillation multiple hospitalizations, history of acute respiratory failure requiring ventilator in July 2021 was admitted on 06/19/2020 for  Principal Problem:   Acute pulmonary edema  (HCC) Active Problems:   Tobacco abuse   ETOH abuse   ESRD on dialysis ([place])   Essential hypertension   Anemia in ESRD (end-stage renal disease) ([place])   Acute on chronic congestive heart failure ([place])   Acute respiratory failure with hypoxia (HCC)   Abdominal pain  Acute pulmonary edema ([place]) [J81.0]  #.  Shortness of breath and volume overload in setting of ESRD  [facility] [place]/MWF/[facility] [place]/EDW 67 kg Last hemodialysis September 24 Patient does appear to have mild volume overload. Currently 2 L O2 but comfortable We will plan for dialysis tomorrow morning We will also obtain bladder scan to rule out urinary retention as abdomen appears to be distended   #. Anemia of CKD  Lab Results  Component Value Date   HGB 7.6 (L) 06/18/2020   Low dose EPO with HD  #. Secondary hyperparathyroidism of renal origin N 25.81      Component Value Date/Time   PTH 669 (H) 02/20/2020 1212   Lab Results  Component Value Date   PHOS 4.2 04/10/2020   Monitor calcium and phos level during this admission    LOS: [place] 9/28/20213:33 [place], [place]

## 2020-06-19 NOTE — H&P (Signed)
History and Physical    Jordan [name]:[id number] DOB: 1966-08-05 DOA: 06/19/2020  Referring MD/NP/PA:   PCP: Jearld Fenton, NP   Patient coming from:  The patient is coming from home.  At baseline, pt is independent for most of ADL.        Chief Complaint: SOB and abdominal pain  HPI: Jordan [name] is a 54 y.o. male with medical history significant of hypertension not taking medications, alcohol abuse, tobacco abuse, anemia, ESRD-HD (MWF), noncompliance to dialysis, who presents with shortness of breath.  Patient states that he has been having abdominal pain in the past several days.  It is located in the right upper quadrant, intermittent, moderate, aching, nonradiating.  Denies nausea vomiting, diarrhea.  No fever or chills.  Because of abdominal pain, he did not go to dialysis on Monday.  Last dialysis was on last Friday.  He developed shortness of breath, which has been progressively worsening.  He has mild dry cough, but no chest pain.  No unilateral weakness.  No symptoms of UTI.  He was found to have oxygen desaturation to 88% on room air, which improved to 95 on 2 L oxygen.   ED Course: pt was found to have WBC 9.6, lipase 49, negative Covid PCR, alcohol level less than 10, potassium 4.7, bicarbonate 24, creatinine 13.5, BUN 76.  Temperature normal, blood pressure 164/114, heart rate 101 --> 88, RR 24--> 18.  Chest x-ray showed vascular congestion and pulmonary edema. Right upper quadrant of ultrasound showed gallstones without evidence of cholecystitis.  Patient is placed on MedSurg for obs. Dr. Candiss Norse of nephrology is consulted for dialysis.  Review of Systems:   General: no fevers, chills, no body weight gain, has fatigue HEENT: no blurry vision, hearing changes or sore throat Respiratory: has dyspnea, coughing, no wheezing CV: no chest pain, no palpitations GI: no nausea, vomiting, has abdominal pain, no diarrhea, constipation GU: no dysuria, burning on urination,  increased urinary frequency, hematuria  Ext: no leg edema Neuro: no unilateral weakness, numbness, or tingling, no vision change or hearing loss Skin: no rash, no skin tear. MSK: No muscle spasm, no deformity, no limitation of range of movement in spin Heme: No easy bruising.  Travel history: No recent long distant travel.  Allergy: No Known Allergies  Past Medical History:  Diagnosis Date  . Anemia   . Aortic atherosclerosis ([place]) 11/12/2019  . CKD (chronic kidney disease)    Stage 5  Dialysis - M/W/F in [place], [place]  . Dyspnea    tx with inhaler when sick  . ED (erectile dysfunction)   . Emphysema of lung ([place]) 11/12/2019  . ETOH abuse   . History of blood transfusion   . Hypertension   . Wears dentures     Past Surgical History:  Procedure Laterality Date  . BASCILIC VEIN TRANSPOSITION Left 08/07/2016   Procedure: LEFT BASILIC VEIN TRANSPOSITION;  Surgeon: Angelia Mould, MD;  Location: [facility];  Service: Vascular;  Laterality: Left;  . CLOSED REDUCTION NASAL FRACTURE N/A 08/11/2019   Procedure: CLOSED REDUCTION NASAL FRACTURE WITH STABILIZATION;  Surgeon: Irene Limbo, MD;  Location: [facility];  Service: Plastics;  Laterality: N/A;  . COLONOSCOPY N/A 08/12/2016   Procedure: COLONOSCOPY;  Surgeon: Otis Brace, MD;  Location: [facility];  Service: Gastroenterology;  Laterality: N/A;  . ESOPHAGOGASTRODUODENOSCOPY N/A 08/12/2016   Procedure: ESOPHAGOGASTRODUODENOSCOPY (EGD);  Surgeon: Otis Brace, MD;  Location: [facility];  Service: Gastroenterology;  Laterality: N/A;  . INSERTION OF  DIALYSIS CATHETER N/A 08/07/2016   Procedure: INSERTION OF TUNNELED DIALYSIS CATHETER;  Surgeon: Angelia Mould, MD;  Location: [facility];  Service: Vascular;  Laterality: N/A;  . LIGATION OF ARTERIOVENOUS  FISTULA Left 09/12/2016   Procedure: BANDING OF LEFT  ARTERIOVENOUS  FISTULA;  Surgeon: Angelia Mould, MD;  Location: [facility];  Service: Vascular;  Laterality:  Left;  . LOWER EXTREMITY INTERVENTION Right 12/02/2018   Procedure: LOWER EXTREMITY INTERVENTION;  Surgeon: Algernon Huxley, MD;  Location: [facility];  Service: Cardiovascular;  Laterality: Right;    Social History:  reports that he has been smoking cigarettes. He has a 20.00 pack-year smoking history. He has never used smokeless tobacco. He reports current alcohol use of about 21.0 standard drinks of alcohol per week. He reports that he does not use drugs.  Family History:  Family History  Problem Relation Age of Onset  . Diabetes Mother   . Kidney failure Mother   . Healthy Father   . Kidney failure Brother   . Healthy Sister   . Kidney disease Daughter   . Post-traumatic stress disorder Neg Hx   . Bladder Cancer Neg Hx   . Kidney cancer Neg Hx      Prior to Admission medications   Medication Sig Start Date End Date Taking? Authorizing Provider  calcium acetate (PHOSLO) 667 MG capsule Take 2 capsules (1,334 mg total) by mouth 3 (three) times daily with meals. Patient taking differently: Take 2,001 mg by mouth 3 (three) times daily with meals.  02/12/19   Fritzi Mandes, MD  camphor-menthol [facility]) lotion Apply 1 application topically as needed for itching. 11/23/19   Charlann Lange, PA-C  hydrOXYzine (ATARAX/VISTARIL) 50 MG tablet Take 1 tablet (50 mg total) by mouth 3 (three) times daily as needed. 06/04/20   Blake Divine, MD  OLANZapine (ZYPREXA) 2.5 MG tablet Take 1 tablet (2.5 mg total) by mouth at bedtime. 04/10/20   Candee Furbish, MD  patiromer (VELTASSA) 8.4 g packet Take 1 packet (8.4 g total) by mouth daily. 03/14/20   Lorella Nimrod, MD  sevelamer carbonate (RENVELA) 800 MG tablet Take 800 mg by mouth 3 (three) times daily with meals.    [provider]    Physical Exam: Vitals:   06/19/20 0635 06/19/20 1111 06/19/20 1328 06/19/20 1728  BP: (!) 167/98 (!) 159/101 (!) 164/114 (!) 183/103  Pulse: 96 91 88 92  Resp: 18 18 18    Temp: 98.2 F (36.8 C) 97.6 F  (36.4 C) 97.6 F (36.4 C)   TempSrc: Oral Oral Oral   SpO2: 95% 100% 100% 97%  Weight:      Height:       General: Not in acute distress HEENT:       Eyes: PERRL, EOMI, no scleral icterus.       ENT: No discharge from the ears and nose, no pharynx injection, no tonsillar enlargement.        Neck: No JVD, no bruit, no mass felt. Heme: No neck lymph node enlargement. Cardiac: S1/S2, RRR, No murmurs, No gallops or rubs. Respiratory: Has crackles bilaterally  GI: Soft, nondistended, has tenderness in RUQ, no rebound pain, no organomegaly, BS present. GU: No hematuria Ext: No pitting leg edema bilaterally. 2+DP/PT pulse bilaterally. Musculoskeletal: No joint deformities, No joint redness or warmth, no limitation of ROM in spin. Skin: No rashes.  Neuro: Alert, oriented X3, cranial nerves II-XII grossly intact, moves all extremities normally.  Psych: Patient is not psychotic,  no suicidal or hemocidal ideation.  Labs on Admission: I have personally reviewed following labs and imaging studies  CBC: Recent Labs  Lab 06/18/20 2319  WBC 9.6  HGB 7.6*  HCT 23.8*  MCV 96.4  PLT 767   Basic Metabolic Panel: Recent Labs  Lab 06/18/20 2319  NA 139  K 4.7  CL 97*  CO2 24  GLUCOSE 107*  BUN 76*  CREATININE 13.52*  CALCIUM 8.8*   GFR: Estimated Creatinine Clearance: 5.8 mL/min (A) (by C-G formula based on SCr of 13.52 mg/dL (H)). Liver Function Tests: Recent Labs  Lab 06/18/20 2319  AST 16  ALT 10  ALKPHOS 167*  BILITOT 1.9*  PROT 8.1  ALBUMIN 4.0   Recent Labs  Lab 06/18/20 2319  LIPASE 49   No results for input(s): AMMONIA in the last 168 hours. Coagulation Profile: No results for input(s): INR, PROTIME in the last 168 hours. Cardiac Enzymes: No results for input(s): CKTOTAL, CKMB, CKMBINDEX, TROPONINI in the last 168 hours. BNP (last 3 results) No results for input(s): PROBNP in the last 8760 hours. HbA1C: No results for input(s): HGBA1C in the last 72  hours. CBG: No results for input(s): GLUCAP in the last 168 hours. Lipid Profile: No results for input(s): CHOL, HDL, LDLCALC, TRIG, CHOLHDL, LDLDIRECT in the last 72 hours. Thyroid Function Tests: No results for input(s): TSH, T4TOTAL, FREET4, T3FREE, THYROIDAB in the last 72 hours. Anemia Panel: No results for input(s): VITAMINB12, FOLATE, FERRITIN, TIBC, IRON, RETICCTPCT in the last 72 hours. Urine analysis:    Component Value Date/Time   COLORURINE YELLOW 01/24/2020 0904   APPEARANCEUR CLEAR 01/24/2020 0904   LABSPEC 1.012 01/24/2020 0904   PHURINE 9.0 (H) 01/24/2020 0904   GLUCOSEU 150 (A) 01/24/2020 0904   HGBUR SMALL (A) 01/24/2020 0904   BILIRUBINUR NEGATIVE 01/24/2020 0904   KETONESUR NEGATIVE 01/24/2020 0904   PROTEINUR 100 (A) 01/24/2020 0904   NITRITE NEGATIVE 01/24/2020 0904   LEUKOCYTESUR MODERATE (A) 01/24/2020 0904   Sepsis Labs: @LABRCNTIP (procalcitonin:4,lacticidven:4) ) Recent Results (from the past 240 hour(s))  Respiratory Panel by RT PCR (Flu A&B, Covid) - Nasopharyngeal Swab     Status: None   Collection Time: 06/19/20  4:32 PM   Specimen: Nasopharyngeal Swab  Result Value Ref Range Status   SARS Coronavirus 2 by RT PCR NEGATIVE NEGATIVE Final    Comment: (NOTE) SARS-CoV-2 target nucleic acids are NOT DETECTED.  The SARS-CoV-2 RNA is generally detectable in upper respiratoy specimens during the acute phase of infection. The lowest concentration of SARS-CoV-2 viral copies this assay can detect is 131 copies/mL. A negative result does not preclude SARS-Cov-2 infection and should not be used as the sole basis for treatment or other patient management decisions. A negative result may occur with  improper specimen collection/handling, submission of specimen other than nasopharyngeal swab, presence of viral mutation(s) within the areas targeted by this assay, and inadequate number of viral copies (<131 copies/mL). A negative result must be combined with  clinical observations, patient history, and epidemiological information. The expected result is Negative.  Fact Sheet for Patients:  PinkCheek.be  Fact Sheet for Healthcare Providers:  GravelBags.it  This test is no t yet approved or cleared by the [place] FDA and  has been authorized for detection and/or diagnosis of SARS-CoV-2 by FDA under an Emergency Use Authorization (EUA). This EUA will remain  in effect (meaning this test can be used) for the duration of the COVID-19 declaration under Section 564(b)(1) of the Act, 21  U.S.C. section 360bbb-3(b)(1), unless the authorization is terminated or revoked sooner.     Influenza A by PCR NEGATIVE NEGATIVE Final   Influenza B by PCR NEGATIVE NEGATIVE Final    Comment: (NOTE) The Xpert Xpress SARS-CoV-2/FLU/RSV assay is intended as an aid in  the diagnosis of influenza from Nasopharyngeal swab specimens and  should not be used as a sole basis for treatment. Nasal washings and  aspirates are unacceptable for Xpert Xpress SARS-CoV-2/FLU/RSV  testing.  Fact Sheet for Patients: PinkCheek.be  Fact Sheet for Healthcare Providers: GravelBags.it  This test is not yet approved or cleared by the [place] FDA and  has been authorized for detection and/or diagnosis of SARS-CoV-2 by  FDA under an Emergency Use Authorization (EUA). This EUA will remain  in effect (meaning this test can be used) for the duration of the  Covid-19 declaration under Section 564(b)(1) of the Act, 21  U.S.C. section 360bbb-3(b)(1), unless the authorization is  terminated or revoked. Performed at [facility], [address], [place], [place] [address]      Radiological Exams on Admission: DG Chest 2 View  Result Date: 06/19/2020 CLINICAL DATA:  Shortness of breath EXAM: CHEST - 2 VIEW COMPARISON:  06/06/2020 FINDINGS: Stable  mild cardiomegaly. Atherosclerotic calcification of the thoracic aorta. Pulmonary vascular congestion with mild diffuse interstitial opacities throughout both lungs. No pleural effusion. No pneumothorax. IMPRESSION: Findings suggestive of CHF with mild interstitial edema. Electronically Signed   By: Davina Poke D.O.   On: 06/19/2020 15:20   US ABDOMEN LIMITED RUQ  Result Date: 06/19/2020 CLINICAL DATA:  Right upper quadrant pain EXAM: ULTRASOUND ABDOMEN LIMITED RIGHT UPPER QUADRANT COMPARISON:  None. FINDINGS: Gallbladder: The gallbladder is contracted. No visible stones. Wall thickness measures 7 mm but is probably exaggerated by underdistention. A negative sonographic Percell Miller sign was reported by the sonographer. No pericholecystic fluid. Common bile duct: Diameter: 3 mm Liver: No focal lesion identified. Within normal limits in parenchymal echogenicity. Portal vein is patent on color Doppler imaging with normal direction of blood flow towards the liver. Other: There is ascites within all quadrants. The right kidney is atrophic. IMPRESSION: 1. Contracted gallbladder without evidence of acute cholecystitis. 2. Ascites within all quadrants. Electronically Signed   By: Ulyses Jarred M.D.   On: 06/19/2020 02:09     EKG:   Not done in ED, will get one.   Assessment/Plan Principal Problem:   Acute pulmonary edema (HCC) Active Problems:   Tobacco abuse   ETOH abuse   ESRD on dialysis [facility])   Essential hypertension   Anemia in ESRD (end-stage renal disease) ([place])   Acute on chronic congestive heart failure (HCC)   Acute respiratory failure with hypoxia (HCC)   Abdominal pain    Acute respiratory failure with hypoxia due to acute pulmonary edema and acute on chronic congestive heart failure: 2D echo on 04/05/2020 showed EF of 45-50%.  Chest x-ray showed pulmonary edema.  This is most likely due to dialysis noncompliance.  -Placed on MedSurg bed for observation -Renal was consulted for  dialysis, Dr. Candiss Norse -prn albuterol for SOB -Nasal cannula oxygen to maintain oxygen saturation above 93%  Tobacco abuse -Nicotine patch  ETOH abuse -CIWA protocol  ESRD on dialysis (MWF) -Renal consulted for dialysis  Essential hypertension: Blood pressure 164/114.  Patient is not taking medications at home -IV hydralazine as needed -Start amlodipine 5 mg daily  Anemia in ESRD (end-stage renal disease) ([place]): Hemoglobin 7.6, stable (7.5 on 06/06/2020) -Follow-up by CBC  Abdominal pain: Patient has right upper quadrant abdominal pain.  Lipase normal.  Liver function normal except for total bilirubin 1.9.  Right upper quadrant ultrasound showed gallstones but no evidence of cholecystitis. -Supportive care. -As needed Zofran -As needed morphine for pain    DVT ppx: SQ Heparin    Code Status: Full code Family Communication: not done, no family member is at bed side.   Disposition Plan:  Anticipate discharge back to previous environment Consults called:  Dr. Candiss Norse of renal Admission status: Med-surg bed for obs   Status is: Observation  The patient remains OBS appropriate and will d/c before 2 midnights.  Dispo: The patient is from: Home              Anticipated d/c is to: Home              Anticipated d/c date is: 1 day              Patient currently is not medically stable to d/c.          Date of Service 06/19/2020    Ivor Costa [facility]   If 7PM-7AM, please contact night-coverage [email or web address] 06/19/2020, 6:34 PM

## 2020-06-19 NOTE — ED Notes (Signed)
Pulse ox 88% on ra; O2 placed at 2l/min via [place] to bring sat to 95%

## 2020-06-19 NOTE — ED Provider Notes (Signed)
[facility] Emergency Department Provider Note   ____________________________________________   First MD Initiated Contact with Patient 06/19/20 1338     (approximate)  I have reviewed the triage vital signs and the nursing notes.   HISTORY  Chief Complaint Abdominal Pain    HPI Jordan [name] is a 54 y.o. male with past medical history of ESRD on HD, hypertension, atrial fibrillation, and CHF who presents to the ED for abdominal pain and shortness of breath.  Patient reports that he developed some sharp pain in the right upper quadrant of his abdomen last night, that has been gradually improving since then.  He denies any associated nausea or vomiting and has not had any changes in his bowel movements.  He reports ongoing itchy rash in his extremities, which she states has made it difficult for him to go to his regular dialysis treatment.  He states he was last dialyzed 4 days ago but missed his usual dialysis appointment yesterday.  He now feels like he is holding onto extra fluid and having some difficulty breathing.  He denies any fevers, cough, or chest pain.        Past Medical History:  Diagnosis Date  . Anemia   . Aortic atherosclerosis ([place]) 11/12/2019  . CKD (chronic kidney disease)    Stage 5  Dialysis - M/W/F in [place], [place]  . Dyspnea    tx with inhaler when sick  . ED (erectile dysfunction)   . Emphysema of lung ([place]) 11/12/2019  . ETOH abuse   . History of blood transfusion   . Hypertension   . Wears dentures     Patient Active Problem List   Diagnosis Date Noted  . Abdominal pain 06/19/2020  . Depression 04/09/2020  . Current mild episode of major depressive disorder without prior episode ([place]) 04/09/2020  . Atrial fibrillation with RVR ([place]) 04/04/2020  . Acute on chronic systolic CHF (congestive heart failure) ([place]) 04/04/2020  . Non-compliance with renal dialysis ([place]) 03/21/2020  . Acute CHF (congestive heart failure)  ([place]) 03/12/2020  . 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 ([place]) 02/03/2020  . Acute on chronic combined systolic and diastolic CHF (congestive heart failure) ([place]) 02/03/2020  . Pulmonary edema 01/31/2020  . Syncopal episodes 01/24/2020  . Macrocytic anemia 01/24/2020  . Hyperkalemia 01/24/2020  . Fluid overload 11/12/2019  . Acute respiratory failure with hypoxia ([place]) 11/12/2019  . Aortic atherosclerosis ([place]) 11/12/2019  . Emphysema of lung ([place]) 11/12/2019  . Acute pulmonary edema ([place]) 10/31/2019  . Acute on chronic congestive heart failure ([place]) 10/22/2019  . Acute respiratory distress 10/06/2019  . Volume overload 10/05/2019  . Hypokalemia 10/05/2019  . ESRD (end stage renal disease) ([place]) 10/01/2019  . Leg mass, right   . Hyperbilirubinemia   . Symptomatic anemia 09/19/2019  . Pruritus 09/19/2019  . Hemoptysis 02/11/2019  . Hypercalcemia 01/19/2019  . Elevated troponin 05/17/2018  . Anemia in ESRD (end-stage renal disease) ([place]) 05/17/2018  . Hepatitis B 03/19/2018  . ESRD on dialysis ([place]) 05/26/2017  . Essential hypertension 05/26/2017  . Urinary tract infection, site not specified 10/17/2016  . Coagulation defect, unspecified ([place]) 08/13/2016  . Diarrhea, unspecified 08/13/2016  . Encounter for immunization 08/13/2016  . Headache, unspecified 08/13/2016  . Hypertensive chronic kidney disease with stage 5 chronic kidney disease or end stage renal disease ([place]) 08/13/2016  . Iron deficiency anemia, unspecified 08/13/2016  . Pain, unspecified 08/13/2016  . Moderate protein-calorie malnutrition ([place]) 08/13/2016  . Secondary  hyperparathyroidism of renal origin ([place]) 08/13/2016  . Tobacco abuse 08/04/2016  . ETOH abuse 08/04/2016    Past Surgical History:  Procedure Laterality Date  . BASCILIC VEIN TRANSPOSITION Left 08/07/2016   Procedure: LEFT BASILIC VEIN TRANSPOSITION;  Surgeon: Angelia Mould, MD;  Location: [place];  Service: Vascular;   Laterality: Left;  . CLOSED REDUCTION NASAL FRACTURE N/A 08/11/2019   Procedure: CLOSED REDUCTION NASAL FRACTURE WITH STABILIZATION;  Surgeon: Irene Limbo, MD;  Location: [place];  Service: Plastics;  Laterality: N/A;  . COLONOSCOPY N/A 08/12/2016   Procedure: COLONOSCOPY;  Surgeon: Otis Brace, MD;  Location: [place];  Service: Gastroenterology;  Laterality: N/A;  . ESOPHAGOGASTRODUODENOSCOPY N/A 08/12/2016   Procedure: ESOPHAGOGASTRODUODENOSCOPY (EGD);  Surgeon: Otis Brace, MD;  Location: [place];  Service: Gastroenterology;  Laterality: N/A;  . INSERTION OF DIALYSIS CATHETER N/A 08/07/2016   Procedure: INSERTION OF TUNNELED DIALYSIS CATHETER;  Surgeon: Angelia Mould, MD;  Location: [place];  Service: Vascular;  Laterality: N/A;  . LIGATION OF ARTERIOVENOUS  FISTULA Left 09/12/2016   Procedure: BANDING OF LEFT  ARTERIOVENOUS  FISTULA;  Surgeon: Angelia Mould, MD;  Location: [place];  Service: Vascular;  Laterality: Left;  . LOWER EXTREMITY INTERVENTION Right 12/02/2018   Procedure: LOWER EXTREMITY INTERVENTION;  Surgeon: Algernon Huxley, MD;  Location: [place];  Service: Cardiovascular;  Laterality: Right;    Prior to Admission medications   Medication Sig Start Date End Date Taking? Authorizing Provider  calcium acetate (PHOSLO) 667 MG capsule Take 2 capsules (1,334 mg total) by mouth 3 (three) times daily with meals. 02/12/19  Yes Fritzi Mandes, MD  OLANZapine (ZYPREXA) 2.5 MG tablet Take 1 tablet (2.5 mg total) by mouth at bedtime. 04/10/20  Yes Candee Furbish, MD  sevelamer carbonate (RENVELA) 800 MG tablet Take 800 mg by mouth 3 (three) times daily with meals.   Yes [provider]  camphor-menthol Timoteo Ace) lotion Apply 1 application topically as needed for itching. Patient not taking: Reported on 06/19/2020 11/23/19   Charlann Lange, PA-C  hydrOXYzine (ATARAX/VISTARIL) 50 MG tablet Take 1 tablet (50 mg total) by mouth 3 (three) times daily  as needed. 06/04/20   Blake Divine, MD  patiromer (VELTASSA) 8.4 g packet Take 1 packet (8.4 g total) by mouth daily. Patient not taking: Reported on 06/19/2020 03/14/20   Lorella Nimrod, MD    Allergies Patient has no known allergies.  Family History  Problem Relation Age of Onset  . Diabetes Mother   . Kidney failure Mother   . Healthy Father   . Kidney failure Brother   . Healthy Sister   . Kidney disease Daughter   . Post-traumatic stress disorder Neg Hx   . Bladder Cancer Neg Hx   . Kidney cancer Neg Hx     Social History Social History   Tobacco Use  . Smoking status: Current Every Day Smoker    Packs/day: 0.50    Years: 40.00    Pack years: 20.00    Types: Cigarettes  . Smokeless tobacco: Never Used  Vaping Use  . Vaping Use: Never used  Substance Use Topics  . Alcohol use: Yes    Alcohol/week: 21.0 standard drinks    Types: 21 Cans of beer per week    Comment: last drink 6 months ago  . Drug use: No    Review of Systems  Constitutional: No fever/chills Eyes: No visual changes. ENT: No sore throat. Cardiovascular: Denies chest pain. Respiratory: Positive for shortness  of breath. Gastrointestinal: Positive for abdominal pain.  No nausea, no vomiting.  No diarrhea.  No constipation. Genitourinary: Negative for dysuria. Musculoskeletal: Negative for back pain. Skin: Positive for rash. Neurological: Negative for headaches, focal weakness or numbness.  ____________________________________________   PHYSICAL EXAM:  VITAL SIGNS: ED Triage Vitals  Enc Vitals Group     BP 06/18/20 2315 (!) 182/95     Pulse Rate 06/18/20 2315 (!) 101     Resp 06/18/20 2315 (!) 24     Temp 06/18/20 2315 97.6 F (36.4 C)     Temp Source 06/18/20 2315 Oral     SpO2 06/18/20 2318 99 %     Weight 06/18/20 2315 160 lb (72.6 kg)     Height 06/18/20 2315 5\' 4"  (1.626 m)     Head Circumference --      Peak Flow --      Pain Score 06/18/20 2315 8     Pain Loc --      Pain  Edu? --      Excl. in [place]? --     Constitutional: Alert and oriented. Eyes: Conjunctivae are normal. Head: Atraumatic. Nose: No congestion/rhinnorhea. Mouth/Throat: Mucous membranes are moist. Neck: Normal ROM Cardiovascular: Normal rate, regular rhythm. Grossly normal heart sounds.  Left upper extremity AV fistula with palpable thrill. Respiratory: Normal respiratory effort.  No retractions. Lungs CTAB. Gastrointestinal: Soft and nontender. No distention. Genitourinary: deferred Musculoskeletal: No lower extremity tenderness nor edema. Neurologic:  Normal speech and language. No gross focal neurologic deficits are appreciated. Skin:  Skin is warm, dry and intact. No rash noted. Psychiatric: Mood and affect are normal. Speech and behavior are normal.  ____________________________________________   LABS (all labs ordered are listed, but only abnormal results are displayed)  Labs Reviewed  CBC - Abnormal; Notable for the following components:      Result Value   RBC 2.47 (*)    Hemoglobin 7.6 (*)    HCT 23.8 (*)    RDW 16.1 (*)    nRBC 0.4 (*)    All other components within normal limits  COMPREHENSIVE METABOLIC PANEL - Abnormal; Notable for the following components:   Chloride 97 (*)    Glucose, Bld 107 (*)    BUN 76 (*)    Creatinine, Ser 13.52 (*)    Calcium 8.8 (*)    Alkaline Phosphatase 167 (*)    Total Bilirubin 1.9 (*)    GFR calc non Af Amer 4 (*)    GFR calc Af Amer 4 (*)    Anion gap 18 (*)    All other components within normal limits  RESPIRATORY PANEL BY RT PCR (FLU A&B, COVID)  LIPASE, BLOOD  ETHANOL  URINALYSIS, COMPLETE (UACMP) WITH MICROSCOPIC     PROCEDURES  Procedure(s) performed (including Critical Care):  Procedures   ____________________________________________   INITIAL IMPRESSION / ASSESSMENT AND PLAN / ED COURSE       54 year old male with history of ESRD on HD, hypertension, atrial fibrillation, and CHF who presents to the ED  initially for right upper quadrant abdominal pain.  Right upper quadrant ultrasound shows a contracted gallbladder without evidence of cholecystitis and lab work is reassuring with chronic mild elevation in bilirubin.  He states his abdominal pain is much improved now and he has since tolerated p.o. without difficulty, I doubt acute intra-abdominal pathology.  He does not feel like he is getting increasingly short of breath with his O2 sats dropping to 88% on room air.  They subsequently improved with 2 L nasal cannula and I suspect this is due to hypervolemia with patient missing his dialysis treatment yesterday.  Case discussed with Dr. Candiss Norse of nephrology and unfortunately there is no availability for patient to be dialyzed today.  Dr. Candiss Norse recommends admission to the hospitalist for dialysis in the morning.  Case discussed with hospitalist for admission and patient agrees with plan.      ____________________________________________   FINAL CLINICAL IMPRESSION(S) / ED DIAGNOSES  Final diagnoses:  Shortness of breath  ESRD (end stage renal disease) [facility])     ED Discharge Orders    None       Note:  This document was prepared using Dragon voice recognition software and may include unintentional dictation errors.   Blake Divine, MD 06/19/20 [phone]

## 2020-06-20 ENCOUNTER — Other Ambulatory Visit: Payer: Self-pay

## 2020-06-20 ENCOUNTER — Encounter: Payer: Self-pay | Admitting: Internal Medicine

## 2020-06-20 DIAGNOSIS — D631 Anemia in chronic kidney disease: Secondary | ICD-10-CM | POA: Diagnosis not present

## 2020-06-20 DIAGNOSIS — E8770 Fluid overload, unspecified: Secondary | ICD-10-CM | POA: Diagnosis not present

## 2020-06-20 DIAGNOSIS — R0602 Shortness of breath: Secondary | ICD-10-CM | POA: Diagnosis not present

## 2020-06-20 DIAGNOSIS — N186 End stage renal disease: Secondary | ICD-10-CM | POA: Diagnosis not present

## 2020-06-20 DIAGNOSIS — J810 Acute pulmonary edema: Secondary | ICD-10-CM | POA: Diagnosis not present

## 2020-06-20 DIAGNOSIS — N2581 Secondary hyperparathyroidism of renal origin: Secondary | ICD-10-CM | POA: Diagnosis not present

## 2020-06-20 LAB — CBC
HCT: 22.1 % — ABNORMAL LOW (ref 39.0–52.0)
Hemoglobin: 7 g/dL — ABNORMAL LOW (ref 13.0–17.0)
MCH: 31 pg (ref 26.0–34.0)
MCHC: 31.7 g/dL (ref 30.0–36.0)
MCV: 97.8 fL (ref 80.0–100.0)
Platelets: 151 10*3/uL (ref 150–400)
RBC: 2.26 MIL/uL — ABNORMAL LOW (ref 4.22–5.81)
RDW: 16 % — ABNORMAL HIGH (ref 11.5–15.5)
WBC: 8.3 10*3/uL (ref 4.0–10.5)
nRBC: 0.2 % (ref 0.0–0.2)

## 2020-06-20 LAB — BASIC METABOLIC PANEL
Anion gap: 19 — ABNORMAL HIGH (ref 5–15)
BUN: 91 mg/dL — ABNORMAL HIGH (ref 6–20)
CO2: 24 mmol/L (ref 22–32)
Calcium: 9 mg/dL (ref 8.9–10.3)
Chloride: 99 mmol/L (ref 98–111)
Creatinine, Ser: 14.78 mg/dL — ABNORMAL HIGH (ref 0.61–1.24)
GFR calc Af Amer: 4 mL/min — ABNORMAL LOW (ref >60)
GFR calc non Af Amer: 3 mL/min — ABNORMAL LOW (ref >60)
Glucose, Bld: 108 mg/dL — ABNORMAL HIGH (ref 70–99)
Potassium: 5.2 mmol/L — ABNORMAL HIGH (ref 3.5–5.1)
Sodium: 142 mmol/L (ref 135–145)

## 2020-06-20 LAB — MRSA PCR SCREENING: MRSA by PCR: NEGATIVE

## 2020-06-20 LAB — PHOSPHORUS: Phosphorus: 5.1 mg/dL — ABNORMAL HIGH (ref 2.5–4.6)

## 2020-06-20 LAB — MAGNESIUM: Magnesium: 2.8 mg/dL — ABNORMAL HIGH (ref 1.7–2.4)

## 2020-06-20 MED ORDER — THIAMINE HCL 100 MG PO TABS: 100.0000 mg | ORAL_TABLET | Freq: Every day | ORAL | Status: DC

## 2020-06-20 MED ORDER — CAMPHOR-MENTHOL 0.5-0.5 % EX LOTN
TOPICAL_LOTION | CUTANEOUS | Status: DC | PRN
  Filled 2020-06-20: qty 222

## 2020-06-20 MED ORDER — EPOETIN ALFA 4000 UNIT/ML IJ SOLN
4000.0000 [IU] | INTRAMUSCULAR | Status: AC
Start: 2020-06-22 — End: ?

## 2020-06-20 MED ORDER — INFLUENZA VAC SPLIT QUAD 0.5 ML IM SUSY: 0.5000 mL | PREFILLED_SYRINGE | INTRAMUSCULAR | 0 refills | Status: AC

## 2020-06-20 MED ORDER — INFLUENZA VAC SPLIT QUAD 0.5 ML IM SUSY: 0.5000 mL | PREFILLED_SYRINGE | INTRAMUSCULAR | Status: DC

## 2020-06-20 MED ORDER — PNEUMOCOCCAL VAC POLYVALENT 25 MCG/0.5ML IJ INJ: 0.5000 mL | INJECTION | INTRAMUSCULAR | Status: DC

## 2020-06-20 MED ORDER — LISINOPRIL 10 MG PO TABS: 10.0000 mg | ORAL_TABLET | Freq: Every day | ORAL | 2 refills | Status: DC

## 2020-06-20 MED ORDER — HEPARIN SODIUM (PORCINE) 1000 UNIT/ML DIALYSIS: 20.0000 [IU]/kg | INTRAMUSCULAR | Status: DC | PRN

## 2020-06-20 MED ORDER — NICOTINE 21 MG/24HR TD PT24: 21.0000 mg | MEDICATED_PATCH | Freq: Every day | TRANSDERMAL | 0 refills | Status: AC

## 2020-06-20 NOTE — Care Management Obs Status (Signed)
[place] NOTIFICATION   Patient Details  Name: GARAN FRAPPIER MRN: [id number] Date of Birth: 1966/02/21   Medicare Observation Status Notification Given:  Yes    Candie Chroman, LCSW 06/20/2020, 4:46 PM

## 2020-06-20 NOTE — TOC Transition Note (Addendum)
Transition of Care [facility]) - CM/SW Discharge Note   Patient Details  Name: Jordan [name] MRN: [id number] Date of Birth: 22-Apr-1966  [facility] of Care [facility]) CM/SW Contact:  Candie Chroman, LCSW Phone Number: 06/20/2020, 4:51 PM   Clinical Narrative: Patient has orders to discharge home today. Patient on 2 L oxygen at home through [employer] which he says he only uses as needed. CSW notified [place] representative that patient has been on 4 L this admission. [employer] just needs DME order for 4 L to have on file. Patient does not need to change out any equipment. Patient stated he does have a scale at home but does not weigh daily. Sent secure chat to RN to let her know he may need CHF education before he leaves. No further concerns. CSW signing off.    5:11 pm: Per MD, patient back to room air after HD. CSW signing off.  Final next level of care: Home/Self Care Barriers to Discharge: No Barriers Identified   Patient Goals and CMS Choice        Discharge Placement                    Patient and family notified of of transfer: 06/20/20  Discharge Plan and Services                                     Social Determinants of Health (SDOH) Interventions     Readmission Risk Interventions No flowsheet data found.

## 2020-06-20 NOTE — Care Management CC44 (Signed)
Condition Code 44 Documentation Completed  Patient Details  Name: MICK TANGUMA MRN: [id number] Date of Birth: Nov 24, 1965   Condition Code 44 given:  Yes Patient signature on Condition Code 44 notice:  Yes Documentation of 2 MD's agreement:  Yes Code 44 added to claim:  Yes    Candie Chroman, LCSW 06/20/2020, 4:46 PM

## 2020-06-20 NOTE — Discharge Summary (Signed)
Physician Discharge Summary   Jordan [name]  male DOB: [birth year]  PRF:[id number]  PCP: Jearld Fenton, NP  Admit date: 06/19/2020 Discharge date: 06/20/2020  Admitted From: home Disposition:  home CODE STATUS: Full code  Discharge Instructions    Discharge instructions   Complete by: As directed    You have fluid buildup that was causing your abdominal discomfort and shortness of breath.  Please make sure you go to your dialysis as scheduled.    Your blood pressure has been high, so we have added a new blood pressure medication Lisinopril 10 mg daily.   Dr. Enzo Bi [facility] Course:  For full details, please see H&P, progress notes, consult notes and ancillary notes.  Briefly,  Jordan [name] is a 54 y.o. male with medical history significant of hypertension not taking medications, alcohol abuse, tobacco abuse, anemia, ESRD-HD (MWF), noncompliance to dialysis, who presented with shortness of breath and abdominal pain.  He was found to have oxygen desaturation to 88% on room air, which improved to 95 on 2 L oxygen. Pt was therefore admitted for observation.  # Acute on chronic respiratory failure with hypoxia due to  # acute pulmonary edema from missing dialysis Chest x-ray showed pulmonary edema.  This is most likely due to dialysis noncompliance.  Pt received inpatient dialysis and reported breathing back to baseline afterwards and was ready to go home.  Per TOC, pt has 2L home O2 that pt uses only as needed.  Tobacco abuse Nicotine patch  ETOH abuse CIWA protocol  ESRD on dialysis (MWF) Dialysis non-compliance Renal consulted for dialysis  Essential hypertension:  Blood pressure 164/114.  Patient is not taking medications at home.  Pt was started on Lisinopril 10 mg daily.  Anemia in ESRD (end-stage renal disease) ([place]):  Hemoglobin 7.6, stable (7.5 on 06/06/2020)  Abdominal pain:  Patient presented with right upper quadrant abdominal  pain.  Lipase normal.  Liver function normal except for total bilirubin 1.9.  Right upper quadrant ultrasound showed gallstones but no evidence of cholecystitis.  Abdominal pain likely due to fullness from fluid overload, and resolved after dialysis.   Discharge Diagnoses:  Principal Problem:   Acute pulmonary edema (HCC) Active Problems:   Tobacco abuse   ETOH abuse   ESRD on dialysis [facility])   Essential hypertension   Anemia in ESRD (end-stage renal disease) ([place])   Acute on chronic congestive heart failure (HCC)   Acute respiratory failure with hypoxia (HCC)   Pulmonary edema   Abdominal pain    Discharge Instructions:  Allergies as of 06/20/2020   No Known Allergies     Medication List    STOP taking these medications   patiromer 8.4 g packet Commonly known as: VELTASSA   Sarna lotion Generic drug: camphor-menthol     TAKE these medications   calcium acetate 667 MG capsule Commonly known as: PHOSLO Take 2 capsules (1,334 mg total) by mouth 3 (three) times daily with meals.   epoetin alfa 4000 UNIT/ML injection Commonly known as: EPOGEN Inject 1 mL (4,000 Units total) into the vein every Monday, Wednesday, and Friday with hemodialysis. Start taking on: June 22, 2020   hydrOXYzine 50 MG tablet Commonly known as: ATARAX/VISTARIL Take 1 tablet (50 mg total) by mouth 3 (three) times daily as needed.   influenza vac split quadrivalent PF 0.5 ML injection Commonly known as: FLUARIX Inject 0.5 mLs into the muscle tomorrow at 10 am for  1 dose. Start taking on: June 21, 2020   lisinopril 10 MG tablet Commonly known as: ZESTRIL Take 1 tablet (10 mg total) by mouth daily. Blood pressure medication.   nicotine 21 mg/24hr patch Commonly known as: NICODERM CQ - dosed in mg/24 hours Place 1 patch (21 mg total) onto the skin daily.   OLANZapine 2.5 MG tablet Commonly known as: ZYPREXA Take 1 tablet (2.5 mg total) by mouth at bedtime.   sevelamer carbonate 800  MG tablet Commonly known as: RENVELA Take 800 mg by mouth 3 (three) times daily with meals.   thiamine 100 MG tablet Take 1 tablet (100 mg total) by mouth daily.        Follow-up Information    [facility] up on 06/28/2020.   Specialty: Cardiology Why: at 1:00pm. Enter through the [place] entrance Contact information: [place], Cleora, NP. Schedule an appointment as soon as possible for a visit in 1 week(s).   Specialties: Internal Medicine, Emergency Medicine Contact information: [place] [address] [phone]               No Known Allergies   The results of significant diagnostics from this hospitalization (including imaging, microbiology, ancillary and laboratory) are listed below for reference.   Consultations:   Procedures/Studies: DG Chest 2 View  Result Date: 06/19/2020 CLINICAL DATA:  Shortness of breath EXAM: CHEST - 2 VIEW COMPARISON:  06/06/2020 FINDINGS: Stable mild cardiomegaly. Atherosclerotic calcification of the thoracic aorta. Pulmonary vascular congestion with mild diffuse interstitial opacities throughout both lungs. No pleural effusion. No pneumothorax. IMPRESSION: Findings suggestive of CHF with mild interstitial edema. Electronically Signed   By: Davina Poke D.O.   On: 06/19/2020 15:20   DG Chest 2 View  Result Date: 06/06/2020 CLINICAL DATA:  Shortness of breath EXAM: CHEST - 2 VIEW COMPARISON:  06/04/2020 FINDINGS: Cardiomegaly, vascular congestion. No overt edema. No confluent opacities or effusions. No acute bony abnormality. Aortic atherosclerosis. IMPRESSION: Cardiomegaly, vascular congestion. Electronically Signed   By: Rolm Baptise M.D.   On: 06/06/2020 10:26   DG Chest 2 View  Result Date: 06/04/2020 CLINICAL DATA:  Shortness of breath. EXAM: CHEST - 2 VIEW  COMPARISON:  Chest x-ray dated April 11, 2020. FINDINGS: Unchanged mild cardiomegaly. Persistent pulmonary vascular congestion and mild interstitial thickening, slightly progressed since the prior study. No focal consolidation, pleural effusion, or pneumothorax. No acute osseous abnormality. IMPRESSION: 1. Slightly worsened mild interstitial pulmonary edema. Electronically Signed   By: Titus Dubin M.D.   On: 06/04/2020 09:43   US ABDOMEN LIMITED RUQ  Result Date: 06/19/2020 CLINICAL DATA:  Right upper quadrant pain EXAM: ULTRASOUND ABDOMEN LIMITED RIGHT UPPER QUADRANT COMPARISON:  None. FINDINGS: Gallbladder: The gallbladder is contracted. No visible stones. Wall thickness measures 7 mm but is probably exaggerated by underdistention. A negative sonographic Percell Miller sign was reported by the sonographer. No pericholecystic fluid. Common bile duct: Diameter: 3 mm Liver: No focal lesion identified. Within normal limits in parenchymal echogenicity. Portal vein is patent on color Doppler imaging with normal direction of blood flow towards the liver. Other: There is ascites within all quadrants. The right kidney is atrophic. IMPRESSION: 1. Contracted gallbladder without evidence of acute cholecystitis. 2. Ascites within all quadrants. Electronically Signed   By: Ulyses Jarred M.D.   On: 06/19/2020 02:09      Labs:  BNP (last 3 results) Recent Labs    02/04/20 0931 03/12/20 1643 03/21/20 0310  BNP 3,406.5* >4,500.0* 5,009.3*   Basic Metabolic Panel: Recent Labs  Lab 06/18/20 2319 06/20/20 0414  NA 139 142  K 4.7 5.2*  CL 97* 99  CO2 24 24  GLUCOSE 107* 108*  BUN 76* 91*  CREATININE 13.52* 14.78*  CALCIUM 8.8* 9.0  MG  --  2.8*  PHOS  --  5.1*   Liver Function Tests: Recent Labs  Lab 06/18/20 2319  AST 16  ALT 10  ALKPHOS 167*  BILITOT 1.9*  PROT 8.1  ALBUMIN 4.0   Recent Labs  Lab 06/18/20 2319  LIPASE 49   No results for input(s): AMMONIA in the last 168  hours. CBC: Recent Labs  Lab 06/18/20 2319 06/20/20 0414  WBC 9.6 8.3  HGB 7.6* 7.0*  HCT 23.8* 22.1*  MCV 96.4 97.8  PLT 164 151   Cardiac Enzymes: No results for input(s): CKTOTAL, CKMB, CKMBINDEX, TROPONINI in the last 168 hours. BNP: Invalid input(s): POCBNP CBG: No results for input(s): GLUCAP in the last 168 hours. D-Dimer No results for input(s): DDIMER in the last 72 hours. Hgb A1c No results for input(s): HGBA1C in the last 72 hours. Lipid Profile No results for input(s): CHOL, HDL, LDLCALC, TRIG, CHOLHDL, LDLDIRECT in the last 72 hours. Thyroid function studies No results for input(s): TSH, T4TOTAL, T3FREE, THYROIDAB in the last 72 hours.  Invalid input(s): FREET3 Anemia work up No results for input(s): VITAMINB12, FOLATE, FERRITIN, TIBC, IRON, RETICCTPCT in the last 72 hours. Urinalysis    Component Value Date/Time   COLORURINE YELLOW 01/24/2020 0904   APPEARANCEUR CLEAR 01/24/2020 0904   LABSPEC 1.012 01/24/2020 0904   PHURINE 9.0 (H) 01/24/2020 0904   GLUCOSEU 150 (A) 01/24/2020 0904   HGBUR SMALL (A) 01/24/2020 0904   BILIRUBINUR NEGATIVE 01/24/2020 0904   KETONESUR NEGATIVE 01/24/2020 0904   PROTEINUR 100 (A) 01/24/2020 0904   NITRITE NEGATIVE 01/24/2020 0904   LEUKOCYTESUR MODERATE (A) 01/24/2020 0904   Sepsis Labs Invalid input(s): PROCALCITONIN,  WBC,  LACTICIDVEN Microbiology Recent Results (from the past 240 hour(s))  Respiratory Panel by RT PCR (Flu A&B, Covid) - Nasopharyngeal Swab     Status: None   Collection Time: 06/19/20  4:32 PM   Specimen: Nasopharyngeal Swab  Result Value Ref Range Status   SARS Coronavirus 2 by RT PCR NEGATIVE NEGATIVE Final    Comment: (NOTE) SARS-CoV-2 target nucleic acids are NOT DETECTED.  The SARS-CoV-2 RNA is generally detectable in upper respiratoy specimens during the acute phase of infection. The lowest concentration of SARS-CoV-2 viral copies this assay can detect is 131 copies/mL. A negative result  does not preclude SARS-Cov-2 infection and should not be used as the sole basis for treatment or other patient management decisions. A negative result may occur with  improper specimen collection/handling, submission of specimen other than nasopharyngeal swab, presence of viral mutation(s) within the areas targeted by this assay, and inadequate number of viral copies (<131 copies/mL). A negative result must be combined with clinical observations, patient history, and epidemiological information. The expected result is Negative.  Fact Sheet for Patients:  PinkCheek.be  Fact Sheet for Healthcare Providers:  GravelBags.it  This test is no t yet approved or cleared by the [place] FDA and  has been authorized for detection and/or diagnosis of SARS-CoV-2 by FDA under an Emergency Use Authorization (EUA). This EUA will remain  in effect (meaning this test can be used) for  the duration of the COVID-19 declaration under Section 564(b)(1) of the Act, 21 U.S.C. section 360bbb-3(b)(1), unless the authorization is terminated or revoked sooner.     Influenza A by PCR NEGATIVE NEGATIVE Final   Influenza B by PCR NEGATIVE NEGATIVE Final    Comment: (NOTE) The Xpert Xpress SARS-CoV-2/FLU/RSV assay is intended as an aid in  the diagnosis of influenza from Nasopharyngeal swab specimens and  should not be used as a sole basis for treatment. Nasal washings and  aspirates are unacceptable for Xpert Xpress SARS-CoV-2/FLU/RSV  testing.  Fact Sheet for Patients: PinkCheek.be  Fact Sheet for Healthcare Providers: GravelBags.it  This test is not yet approved or cleared by the [place] FDA and  has been authorized for detection and/or diagnosis of SARS-CoV-2 by  FDA under an Emergency Use Authorization (EUA). This EUA will remain  in effect (meaning this test can be used) for  the duration of the  Covid-19 declaration under Section 564(b)(1) of the Act, 21  U.S.C. section 360bbb-3(b)(1), unless the authorization is  terminated or revoked. Performed at [facility], [address] [place], [place] [address]   MRSA PCR Screening     Status: None   Collection Time: 06/20/20 12:35 AM   Specimen: Nasopharyngeal  Result Value Ref Range Status   MRSA by PCR NEGATIVE NEGATIVE Final    Comment:        The GeneXpert MRSA Assay (FDA approved for NASAL specimens only), is one component of a comprehensive MRSA colonization surveillance program. It is not intended to diagnose MRSA infection nor to guide or monitor treatment for MRSA infections. Performed at [facility], [address] [place], [place] [address]      Total time spend on discharging this patient, including the last patient exam, discussing the hospital stay, instructions for ongoing care as it relates to all pertinent caregivers, as well as preparing the medical discharge records, prescriptions, and/or referrals as applicable, is 30 minutes.    Enzo Bi, MD  [facility] 06/20/2020, 4:12 PM  If 7PM-7AM, please contact night-coverage

## 2020-06-20 NOTE — Progress Notes (Addendum)
[facility], [place] 06/20/20  Subjective:   LOS: 0  Patient seen in [place] treatment well. He denies worsening SOB,nausea or vomiting.    HEMODIALYSIS FLOWSHEET:  Blood Flow Rate (mL/min): 200 mL/min Arterial Pressure (mmHg): -160 mmHg Venous Pressure (mmHg): 200 mmHg Transmembrane Pressure (mmHg): 70 mmHg Ultrafiltration Rate (mL/min): 1010 mL/min Dialysate Flow Rate (mL/min): 800 ml/min Conductivity: Machine : 14.3 Conductivity: Machine : 14.3 Dialysis Fluid Bolus: Normal Saline Bolus Amount (mL): 300 mL    Objective:  Vital signs in last 24 hours:  Temp:  [97.6 F (36.4 C)-98.3 F (36.8 C)] 98.3 F (36.8 C) (09/29 1045) Pulse Rate:  [85-104] 85 (09/29 1230) Resp:  [17-28] 17 (09/29 1230) BP: (132-187)/(83-114) 141/89 (09/29 1230) SpO2:  [86 %-100 %] 100 % (09/29 1230) Weight:  [72.6 kg] 72.6 kg (09/29 0500)  Weight change: 0.024 kg Filed Weights   06/18/20 2315 06/20/20 0500  Weight: 72.6 kg 72.6 kg    Intake/Output:    Intake/Output Summary (Last 24 hours) at 06/20/2020 1237 Last data filed at 06/19/2020 1728 Gross per 24 hour  Intake 50 ml  Output --  Net 50 ml    Physical Exam: General:  No acute distress, receiving dialysis  HEENT  anicteric, moist oral mucous membrane  Pulm/lungs  normal breathing effort, lungs are clear to auscultation  CVS/Heart  S1S2 regular rhythm, no rub or gallop  Abdomen:   Soft, nontender  Extremities:  No peripheral edema  Neurologic:  Alert, oriented  Access:  Left Arm AVF      Basic Metabolic Panel:  Recent Labs  Lab 06/18/20 2319 06/20/20 0414  NA 139 142  K 4.7 5.2*  CL 97* 99  CO2 24 24  GLUCOSE 107* 108*  BUN 76* 91*  CREATININE 13.52* 14.78*  CALCIUM 8.8* 9.0  MG  --  2.8*  PHOS  --  5.1*     CBC: Recent Labs  Lab 06/18/20 2319 06/20/20 0414  WBC 9.6 8.3  HGB 7.6* 7.0*  HCT 23.8* 22.1*  MCV 96.4 97.8  PLT 164 151      Lab Results   Component Value Date   HEPBSAG NON REACTIVE 03/21/2020   HEPBSAB Reactive 08/08/2016   HEPBIGM EQUIVOCAL (A) 10/22/2019      Microbiology:  Recent Results (from the past 240 hour(s))  Respiratory Panel by RT PCR (Flu A&B, Covid) - Nasopharyngeal Swab     Status: None   Collection Time: 06/19/20  4:32 PM   Specimen: Nasopharyngeal Swab  Result Value Ref Range Status   SARS Coronavirus 2 by RT PCR NEGATIVE NEGATIVE Final    Comment: (NOTE) SARS-CoV-2 target nucleic acids are NOT DETECTED.  The SARS-CoV-2 RNA is generally detectable in upper respiratoy specimens during the acute phase of infection. The lowest concentration of SARS-CoV-2 viral copies this assay can detect is 131 copies/mL. A negative result does not preclude SARS-Cov-2 infection and should not be used as the sole basis for treatment or other patient management decisions. A negative result may occur with  improper specimen collection/handling, submission of specimen other than nasopharyngeal swab, presence of viral mutation(s) within the areas targeted by this assay, and inadequate number of viral copies (<131 copies/mL). A negative result must be combined with clinical observations, patient history, and epidemiological information. The expected result is Negative.  Fact Sheet for Patients:  PinkCheek.be  Fact Sheet for Healthcare Providers:  GravelBags.it  This test is no t yet approved or cleared by the [place] FDA  and  has been authorized for detection and/or diagnosis of SARS-CoV-2 by FDA under an Emergency Use Authorization (EUA). This EUA will remain  in effect (meaning this test can be used) for the duration of the COVID-19 declaration under Section 564(b)(1) of the Act, 21 U.S.C. section 360bbb-3(b)(1), unless the authorization is terminated or revoked sooner.     Influenza A by PCR NEGATIVE NEGATIVE Final   Influenza B by PCR NEGATIVE  NEGATIVE Final    Comment: (NOTE) The Xpert Xpress SARS-CoV-2/FLU/RSV assay is intended as an aid in  the diagnosis of influenza from Nasopharyngeal swab specimens and  should not be used as a sole basis for treatment. Nasal washings and  aspirates are unacceptable for Xpert Xpress SARS-CoV-2/FLU/RSV  testing.  Fact Sheet for Patients: PinkCheek.be  Fact Sheet for Healthcare Providers: GravelBags.it  This test is not yet approved or cleared by the [place] FDA and  has been authorized for detection and/or diagnosis of SARS-CoV-2 by  FDA under an Emergency Use Authorization (EUA). This EUA will remain  in effect (meaning this test can be used) for the duration of the  Covid-19 declaration under Section 564(b)(1) of the Act, 21  U.S.C. section 360bbb-3(b)(1), unless the authorization is  terminated or revoked. Performed at [facility], [address] [place], [place] [address]   MRSA PCR Screening     Status: None   Collection Time: 06/20/20 12:35 AM   Specimen: Nasopharyngeal  Result Value Ref Range Status   MRSA by PCR NEGATIVE NEGATIVE Final    Comment:        The GeneXpert MRSA Assay (FDA approved for NASAL specimens only), is one component of a comprehensive MRSA colonization surveillance program. It is not intended to diagnose MRSA infection nor to guide or monitor treatment for MRSA infections. Performed at [facility], [address] [place], [place] [address]     Coagulation Studies: No results for input(s): LABPROT, INR in the last 72 hours.  Urinalysis: No results for input(s): COLORURINE, LABSPEC, PHURINE, GLUCOSEU, HGBUR, BILIRUBINUR, KETONESUR, PROTEINUR, UROBILINOGEN, NITRITE, LEUKOCYTESUR in the last 72 hours.  Invalid input(s): APPERANCEUR    Imaging: DG Chest 2 View  Result Date: 06/19/2020 CLINICAL DATA:  Shortness of breath EXAM: CHEST - 2 VIEW COMPARISON:   06/06/2020 FINDINGS: Stable mild cardiomegaly. Atherosclerotic calcification of the thoracic aorta. Pulmonary vascular congestion with mild diffuse interstitial opacities throughout both lungs. No pleural effusion. No pneumothorax. IMPRESSION: Findings suggestive of CHF with mild interstitial edema. Electronically Signed   By: Davina Poke D.O.   On: 06/19/2020 15:20   US ABDOMEN LIMITED RUQ  Result Date: 06/19/2020 CLINICAL DATA:  Right upper quadrant pain EXAM: ULTRASOUND ABDOMEN LIMITED RIGHT UPPER QUADRANT COMPARISON:  None. FINDINGS: Gallbladder: The gallbladder is contracted. No visible stones. Wall thickness measures 7 mm but is probably exaggerated by underdistention. A negative sonographic Percell Miller sign was reported by the sonographer. No pericholecystic fluid. Common bile duct: Diameter: 3 mm Liver: No focal lesion identified. Within normal limits in parenchymal echogenicity. Portal vein is patent on color Doppler imaging with normal direction of blood flow towards the liver. Other: There is ascites within all quadrants. The right kidney is atrophic. IMPRESSION: 1. Contracted gallbladder without evidence of acute cholecystitis. 2. Ascites within all quadrants. Electronically Signed   By: Ulyses Jarred M.D.   On: 06/19/2020 02:09     Medications:    sodium chloride      aspirin EC  81 mg Oral Daily  calcium acetate  1,334 mg Oral TID WC   Chlorhexidine Gluconate Cloth  6 each Topical Q0600   epoetin (EPOGEN/PROCRIT) injection  4,000 Units Intravenous Q M,W,F-HD   folic acid  1 mg Oral Daily   heparin  5,000 Units Subcutaneous Q8H   [START ON 06/21/2020] influenza vac split quadrivalent PF  0.5 mL Intramuscular Tomorrow-1000   lisinopril  5 mg Oral Daily   LORazepam  0-4 mg Intravenous Q6H   Followed by   Derrill Memo ON 06/21/2020] LORazepam  0-4 mg Intravenous Q12H   multivitamin with minerals  1 tablet Oral Daily   nicotine  21 mg Transdermal Daily   OLANZapine  2.5 mg  Oral QHS   [START ON 06/21/2020] pneumococcal 23 valent vaccine  0.5 mL Intramuscular Tomorrow-1000   sevelamer carbonate  800 mg Oral TID WC   sodium chloride flush  3 mL Intravenous Q12H   thiamine  100 mg Oral Daily   Or   thiamine  100 mg Intravenous Daily     Assessment/ Plan:  54 y.o. male with end stage renal disease on hemodialysis, alcohol abuse, erectile dysfunction, hypertension atrial fibrillation multiple hospitalizations, history of acute respiratory failure requiring ventilator in July 2021 was admitted on 06/19/2020 for  Principal Problem:   Acute pulmonary edema (HCC) Active Problems:   Tobacco abuse   ETOH abuse   ESRD on dialysis ([place])   Essential hypertension   Anemia in ESRD (end-stage renal disease) ([place])   Acute on chronic congestive heart failure (HCC)   Acute respiratory failure with hypoxia (HCC)   Abdominal pain  Shortness of breath [R06.02] Acute pulmonary edema (HCC) [J81.0] ESRD (end stage renal disease) ([place]) [N18.6]  #.  Shortness of breath and volume overload in setting of ESRD  [facility]/MWF/[facility] [place]/EDW 67 kg Receiving dialysis treatment today Will continue MWF schedule    #. Anemia of CKD  Lab Results  Component Value Date   HGB 7.0 (L) 06/20/2020   Continue Epogen 4000 units with HD MWF  #. Secondary hyperparathyroidism of renal origin N 25.81      Component Value Date/Time   PTH 669 (H) 02/20/2020 1212   Lab Results  Component Value Date   PHOS 5.1 (H) 06/20/2020   Currently on Phoslo,Sevelamer  #Hypertension BP readings within acceptable range with current antihypertensive regimen of lisinopril and Hydralazine PRN   LOS: 0 Crosby Oyster 9/29/202112:37 PM  [place], [place]  Patient was seen and evaluated with Crosby Oyster, NP I agree with her note.

## 2020-06-22 DIAGNOSIS — E8770 Fluid overload, unspecified: Secondary | ICD-10-CM | POA: Diagnosis not present

## 2020-06-22 DIAGNOSIS — R52 Pain, unspecified: Secondary | ICD-10-CM | POA: Diagnosis not present

## 2020-06-22 DIAGNOSIS — N2581 Secondary hyperparathyroidism of renal origin: Secondary | ICD-10-CM | POA: Diagnosis not present

## 2020-06-22 DIAGNOSIS — Z992 Dependence on renal dialysis: Secondary | ICD-10-CM | POA: Diagnosis not present

## 2020-06-22 DIAGNOSIS — L299 Pruritus, unspecified: Secondary | ICD-10-CM | POA: Diagnosis not present

## 2020-06-22 DIAGNOSIS — D631 Anemia in chronic kidney disease: Secondary | ICD-10-CM | POA: Diagnosis not present

## 2020-06-22 DIAGNOSIS — N186 End stage renal disease: Secondary | ICD-10-CM | POA: Diagnosis not present

## 2020-06-22 DIAGNOSIS — D509 Iron deficiency anemia, unspecified: Secondary | ICD-10-CM | POA: Diagnosis not present

## 2020-06-25 ENCOUNTER — Ambulatory Visit: Payer: Medicare Other | Admitting: Internal Medicine

## 2020-06-25 DIAGNOSIS — L299 Pruritus, unspecified: Secondary | ICD-10-CM | POA: Diagnosis not present

## 2020-06-25 DIAGNOSIS — D631 Anemia in chronic kidney disease: Secondary | ICD-10-CM | POA: Diagnosis not present

## 2020-06-25 DIAGNOSIS — D509 Iron deficiency anemia, unspecified: Secondary | ICD-10-CM | POA: Diagnosis not present

## 2020-06-25 DIAGNOSIS — E8770 Fluid overload, unspecified: Secondary | ICD-10-CM | POA: Diagnosis not present

## 2020-06-25 DIAGNOSIS — N186 End stage renal disease: Secondary | ICD-10-CM | POA: Diagnosis not present

## 2020-06-25 DIAGNOSIS — N2581 Secondary hyperparathyroidism of renal origin: Secondary | ICD-10-CM | POA: Diagnosis not present

## 2020-06-25 DIAGNOSIS — Z992 Dependence on renal dialysis: Secondary | ICD-10-CM | POA: Diagnosis not present

## 2020-06-25 DIAGNOSIS — R52 Pain, unspecified: Secondary | ICD-10-CM | POA: Diagnosis not present

## 2020-06-27 DIAGNOSIS — D631 Anemia in chronic kidney disease: Secondary | ICD-10-CM | POA: Diagnosis not present

## 2020-06-27 DIAGNOSIS — N2581 Secondary hyperparathyroidism of renal origin: Secondary | ICD-10-CM | POA: Diagnosis not present

## 2020-06-27 DIAGNOSIS — Z992 Dependence on renal dialysis: Secondary | ICD-10-CM | POA: Diagnosis not present

## 2020-06-27 DIAGNOSIS — D509 Iron deficiency anemia, unspecified: Secondary | ICD-10-CM | POA: Diagnosis not present

## 2020-06-27 DIAGNOSIS — R52 Pain, unspecified: Secondary | ICD-10-CM | POA: Diagnosis not present

## 2020-06-27 DIAGNOSIS — E8770 Fluid overload, unspecified: Secondary | ICD-10-CM | POA: Diagnosis not present

## 2020-06-27 DIAGNOSIS — L299 Pruritus, unspecified: Secondary | ICD-10-CM | POA: Diagnosis not present

## 2020-06-27 DIAGNOSIS — N186 End stage renal disease: Secondary | ICD-10-CM | POA: Diagnosis not present

## 2020-06-27 NOTE — Progress Notes (Deleted)
   Patient ID: Jordan [name], male    DOB: Dec 12, 1965, 54 y.o.   MRN: [id number]  HPI  Jordan [name] is a 54 y/o male with a history of  Echo report from 04/05/20 reviewed and showed an EF of 45-50% along with moderate LVH and moderately elevated PA pressure.   Admitted 06/19/20 due to shortness of breath due to missed dialysis along with abdominal pain. Dialysis provided while admitted and symptoms improve. BP elevated as patient wasn't taking his medications. Abdominal ultrasound showed gallstones but nothing acute. Discharged the following day. Was in the ED 06/06/20 but left AMA. Was in the ED 06/04/20 but LWBS.   He presents today for his initial visit with a chief complaint of  Review of Systems    Physical Exam    Assessment & Plan:  1: Chronic heart failure with mildly reduced ejection fraction- - NYHA class - BNP 03/21/20 was 3603.5  2: HTN- - BP - BMP 06/20/20 reviewed and showed sodium 142, potassium 5.2, creatinine 14.78 and GFR 4  3: ESRD on dialysis- - dialysis on M,W,F

## 2020-06-28 ENCOUNTER — Ambulatory Visit: Payer: Medicare Other | Admitting: Family

## 2020-06-28 ENCOUNTER — Telehealth: Payer: Self-pay | Admitting: Family

## 2020-06-28 NOTE — Telephone Encounter (Signed)
Patient did not show for his [facility] appointment on 06/28/20. Will attempt to reschedule.

## 2020-06-29 DIAGNOSIS — N186 End stage renal disease: Secondary | ICD-10-CM | POA: Diagnosis not present

## 2020-06-29 DIAGNOSIS — Z992 Dependence on renal dialysis: Secondary | ICD-10-CM | POA: Diagnosis not present

## 2020-06-29 DIAGNOSIS — R52 Pain, unspecified: Secondary | ICD-10-CM | POA: Diagnosis not present

## 2020-06-29 DIAGNOSIS — D509 Iron deficiency anemia, unspecified: Secondary | ICD-10-CM | POA: Diagnosis not present

## 2020-06-29 DIAGNOSIS — L299 Pruritus, unspecified: Secondary | ICD-10-CM | POA: Diagnosis not present

## 2020-06-29 DIAGNOSIS — E8770 Fluid overload, unspecified: Secondary | ICD-10-CM | POA: Diagnosis not present

## 2020-06-29 DIAGNOSIS — N2581 Secondary hyperparathyroidism of renal origin: Secondary | ICD-10-CM | POA: Diagnosis not present

## 2020-06-29 DIAGNOSIS — D631 Anemia in chronic kidney disease: Secondary | ICD-10-CM | POA: Diagnosis not present

## 2020-07-02 DIAGNOSIS — D631 Anemia in chronic kidney disease: Secondary | ICD-10-CM | POA: Diagnosis not present

## 2020-07-02 DIAGNOSIS — N186 End stage renal disease: Secondary | ICD-10-CM | POA: Diagnosis not present

## 2020-07-02 DIAGNOSIS — Z992 Dependence on renal dialysis: Secondary | ICD-10-CM | POA: Diagnosis not present

## 2020-07-02 DIAGNOSIS — N2581 Secondary hyperparathyroidism of renal origin: Secondary | ICD-10-CM | POA: Diagnosis not present

## 2020-07-02 DIAGNOSIS — R52 Pain, unspecified: Secondary | ICD-10-CM | POA: Diagnosis not present

## 2020-07-02 DIAGNOSIS — E8770 Fluid overload, unspecified: Secondary | ICD-10-CM | POA: Diagnosis not present

## 2020-07-02 DIAGNOSIS — D509 Iron deficiency anemia, unspecified: Secondary | ICD-10-CM | POA: Diagnosis not present

## 2020-07-02 DIAGNOSIS — L299 Pruritus, unspecified: Secondary | ICD-10-CM | POA: Diagnosis not present

## 2020-07-06 DIAGNOSIS — E8770 Fluid overload, unspecified: Secondary | ICD-10-CM | POA: Diagnosis not present

## 2020-07-06 DIAGNOSIS — R52 Pain, unspecified: Secondary | ICD-10-CM | POA: Diagnosis not present

## 2020-07-06 DIAGNOSIS — D631 Anemia in chronic kidney disease: Secondary | ICD-10-CM | POA: Diagnosis not present

## 2020-07-06 DIAGNOSIS — N186 End stage renal disease: Secondary | ICD-10-CM | POA: Diagnosis not present

## 2020-07-06 DIAGNOSIS — L299 Pruritus, unspecified: Secondary | ICD-10-CM | POA: Diagnosis not present

## 2020-07-06 DIAGNOSIS — D509 Iron deficiency anemia, unspecified: Secondary | ICD-10-CM | POA: Diagnosis not present

## 2020-07-06 DIAGNOSIS — Z992 Dependence on renal dialysis: Secondary | ICD-10-CM | POA: Diagnosis not present

## 2020-07-06 DIAGNOSIS — N2581 Secondary hyperparathyroidism of renal origin: Secondary | ICD-10-CM | POA: Diagnosis not present

## 2020-07-09 DIAGNOSIS — E8770 Fluid overload, unspecified: Secondary | ICD-10-CM | POA: Diagnosis not present

## 2020-07-09 DIAGNOSIS — L299 Pruritus, unspecified: Secondary | ICD-10-CM | POA: Diagnosis not present

## 2020-07-09 DIAGNOSIS — Z992 Dependence on renal dialysis: Secondary | ICD-10-CM | POA: Diagnosis not present

## 2020-07-09 DIAGNOSIS — R52 Pain, unspecified: Secondary | ICD-10-CM | POA: Diagnosis not present

## 2020-07-09 DIAGNOSIS — D631 Anemia in chronic kidney disease: Secondary | ICD-10-CM | POA: Diagnosis not present

## 2020-07-09 DIAGNOSIS — D509 Iron deficiency anemia, unspecified: Secondary | ICD-10-CM | POA: Diagnosis not present

## 2020-07-09 DIAGNOSIS — N186 End stage renal disease: Secondary | ICD-10-CM | POA: Diagnosis not present

## 2020-07-09 DIAGNOSIS — N2581 Secondary hyperparathyroidism of renal origin: Secondary | ICD-10-CM | POA: Diagnosis not present

## 2020-07-10 DIAGNOSIS — Z992 Dependence on renal dialysis: Secondary | ICD-10-CM | POA: Diagnosis not present

## 2020-07-10 DIAGNOSIS — L299 Pruritus, unspecified: Secondary | ICD-10-CM | POA: Diagnosis not present

## 2020-07-10 DIAGNOSIS — D509 Iron deficiency anemia, unspecified: Secondary | ICD-10-CM | POA: Diagnosis not present

## 2020-07-10 DIAGNOSIS — N186 End stage renal disease: Secondary | ICD-10-CM | POA: Diagnosis not present

## 2020-07-10 DIAGNOSIS — N2581 Secondary hyperparathyroidism of renal origin: Secondary | ICD-10-CM | POA: Diagnosis not present

## 2020-07-10 DIAGNOSIS — R52 Pain, unspecified: Secondary | ICD-10-CM | POA: Diagnosis not present

## 2020-07-10 DIAGNOSIS — E8770 Fluid overload, unspecified: Secondary | ICD-10-CM | POA: Diagnosis not present

## 2020-07-10 DIAGNOSIS — D631 Anemia in chronic kidney disease: Secondary | ICD-10-CM | POA: Diagnosis not present

## 2020-07-13 DIAGNOSIS — D631 Anemia in chronic kidney disease: Secondary | ICD-10-CM | POA: Diagnosis not present

## 2020-07-13 DIAGNOSIS — Z992 Dependence on renal dialysis: Secondary | ICD-10-CM | POA: Diagnosis not present

## 2020-07-13 DIAGNOSIS — D509 Iron deficiency anemia, unspecified: Secondary | ICD-10-CM | POA: Diagnosis not present

## 2020-07-13 DIAGNOSIS — N2581 Secondary hyperparathyroidism of renal origin: Secondary | ICD-10-CM | POA: Diagnosis not present

## 2020-07-13 DIAGNOSIS — R52 Pain, unspecified: Secondary | ICD-10-CM | POA: Diagnosis not present

## 2020-07-13 DIAGNOSIS — L299 Pruritus, unspecified: Secondary | ICD-10-CM | POA: Diagnosis not present

## 2020-07-13 DIAGNOSIS — N186 End stage renal disease: Secondary | ICD-10-CM | POA: Diagnosis not present

## 2020-07-13 DIAGNOSIS — E8770 Fluid overload, unspecified: Secondary | ICD-10-CM | POA: Diagnosis not present

## 2020-07-18 DIAGNOSIS — L299 Pruritus, unspecified: Secondary | ICD-10-CM | POA: Diagnosis not present

## 2020-07-18 DIAGNOSIS — E8770 Fluid overload, unspecified: Secondary | ICD-10-CM | POA: Diagnosis not present

## 2020-07-18 DIAGNOSIS — Z992 Dependence on renal dialysis: Secondary | ICD-10-CM | POA: Diagnosis not present

## 2020-07-18 DIAGNOSIS — N186 End stage renal disease: Secondary | ICD-10-CM | POA: Diagnosis not present

## 2020-07-18 DIAGNOSIS — R52 Pain, unspecified: Secondary | ICD-10-CM | POA: Diagnosis not present

## 2020-07-18 DIAGNOSIS — N2581 Secondary hyperparathyroidism of renal origin: Secondary | ICD-10-CM | POA: Diagnosis not present

## 2020-07-18 DIAGNOSIS — D631 Anemia in chronic kidney disease: Secondary | ICD-10-CM | POA: Diagnosis not present

## 2020-07-18 DIAGNOSIS — D509 Iron deficiency anemia, unspecified: Secondary | ICD-10-CM | POA: Diagnosis not present

## 2020-07-20 DIAGNOSIS — E8770 Fluid overload, unspecified: Secondary | ICD-10-CM | POA: Diagnosis not present

## 2020-07-20 DIAGNOSIS — L299 Pruritus, unspecified: Secondary | ICD-10-CM | POA: Diagnosis not present

## 2020-07-20 DIAGNOSIS — Z992 Dependence on renal dialysis: Secondary | ICD-10-CM | POA: Diagnosis not present

## 2020-07-20 DIAGNOSIS — D631 Anemia in chronic kidney disease: Secondary | ICD-10-CM | POA: Diagnosis not present

## 2020-07-20 DIAGNOSIS — N2581 Secondary hyperparathyroidism of renal origin: Secondary | ICD-10-CM | POA: Diagnosis not present

## 2020-07-20 DIAGNOSIS — N186 End stage renal disease: Secondary | ICD-10-CM | POA: Diagnosis not present

## 2020-07-20 DIAGNOSIS — R52 Pain, unspecified: Secondary | ICD-10-CM | POA: Diagnosis not present

## 2020-07-20 DIAGNOSIS — D509 Iron deficiency anemia, unspecified: Secondary | ICD-10-CM | POA: Diagnosis not present

## 2020-07-22 DIAGNOSIS — N186 End stage renal disease: Secondary | ICD-10-CM | POA: Diagnosis not present

## 2020-07-22 DIAGNOSIS — Z992 Dependence on renal dialysis: Secondary | ICD-10-CM | POA: Diagnosis not present

## 2020-07-22 DIAGNOSIS — I129 Hypertensive chronic kidney disease with stage 1 through stage 4 chronic kidney disease, or unspecified chronic kidney disease: Secondary | ICD-10-CM | POA: Diagnosis not present

## 2020-07-23 DIAGNOSIS — N186 End stage renal disease: Secondary | ICD-10-CM | POA: Diagnosis not present

## 2020-07-23 DIAGNOSIS — N2581 Secondary hyperparathyroidism of renal origin: Secondary | ICD-10-CM | POA: Diagnosis not present

## 2020-07-23 DIAGNOSIS — D509 Iron deficiency anemia, unspecified: Secondary | ICD-10-CM | POA: Diagnosis not present

## 2020-07-23 DIAGNOSIS — D631 Anemia in chronic kidney disease: Secondary | ICD-10-CM | POA: Diagnosis not present

## 2020-07-23 DIAGNOSIS — Z992 Dependence on renal dialysis: Secondary | ICD-10-CM | POA: Diagnosis not present

## 2020-07-23 DIAGNOSIS — L299 Pruritus, unspecified: Secondary | ICD-10-CM | POA: Diagnosis not present

## 2020-07-25 DIAGNOSIS — L299 Pruritus, unspecified: Secondary | ICD-10-CM | POA: Diagnosis not present

## 2020-07-25 DIAGNOSIS — N186 End stage renal disease: Secondary | ICD-10-CM | POA: Diagnosis not present

## 2020-07-25 DIAGNOSIS — D631 Anemia in chronic kidney disease: Secondary | ICD-10-CM | POA: Diagnosis not present

## 2020-07-25 DIAGNOSIS — Z992 Dependence on renal dialysis: Secondary | ICD-10-CM | POA: Diagnosis not present

## 2020-07-25 DIAGNOSIS — N2581 Secondary hyperparathyroidism of renal origin: Secondary | ICD-10-CM | POA: Diagnosis not present

## 2020-07-25 DIAGNOSIS — D509 Iron deficiency anemia, unspecified: Secondary | ICD-10-CM | POA: Diagnosis not present

## 2020-07-27 DIAGNOSIS — N2581 Secondary hyperparathyroidism of renal origin: Secondary | ICD-10-CM | POA: Diagnosis not present

## 2020-07-27 DIAGNOSIS — N186 End stage renal disease: Secondary | ICD-10-CM | POA: Diagnosis not present

## 2020-07-27 DIAGNOSIS — L299 Pruritus, unspecified: Secondary | ICD-10-CM | POA: Diagnosis not present

## 2020-07-27 DIAGNOSIS — Z992 Dependence on renal dialysis: Secondary | ICD-10-CM | POA: Diagnosis not present

## 2020-07-27 DIAGNOSIS — D631 Anemia in chronic kidney disease: Secondary | ICD-10-CM | POA: Diagnosis not present

## 2020-07-27 DIAGNOSIS — D509 Iron deficiency anemia, unspecified: Secondary | ICD-10-CM | POA: Diagnosis not present

## 2020-07-30 DIAGNOSIS — N2581 Secondary hyperparathyroidism of renal origin: Secondary | ICD-10-CM | POA: Diagnosis not present

## 2020-07-30 DIAGNOSIS — L299 Pruritus, unspecified: Secondary | ICD-10-CM | POA: Diagnosis not present

## 2020-07-30 DIAGNOSIS — N186 End stage renal disease: Secondary | ICD-10-CM | POA: Diagnosis not present

## 2020-07-30 DIAGNOSIS — D509 Iron deficiency anemia, unspecified: Secondary | ICD-10-CM | POA: Diagnosis not present

## 2020-07-30 DIAGNOSIS — D631 Anemia in chronic kidney disease: Secondary | ICD-10-CM | POA: Diagnosis not present

## 2020-07-30 DIAGNOSIS — Z992 Dependence on renal dialysis: Secondary | ICD-10-CM | POA: Diagnosis not present

## 2020-08-01 DIAGNOSIS — N186 End stage renal disease: Secondary | ICD-10-CM | POA: Diagnosis not present

## 2020-08-01 DIAGNOSIS — D509 Iron deficiency anemia, unspecified: Secondary | ICD-10-CM | POA: Diagnosis not present

## 2020-08-01 DIAGNOSIS — N2581 Secondary hyperparathyroidism of renal origin: Secondary | ICD-10-CM | POA: Diagnosis not present

## 2020-08-01 DIAGNOSIS — D631 Anemia in chronic kidney disease: Secondary | ICD-10-CM | POA: Diagnosis not present

## 2020-08-01 DIAGNOSIS — Z992 Dependence on renal dialysis: Secondary | ICD-10-CM | POA: Diagnosis not present

## 2020-08-01 DIAGNOSIS — L299 Pruritus, unspecified: Secondary | ICD-10-CM | POA: Diagnosis not present

## 2020-08-06 DIAGNOSIS — L299 Pruritus, unspecified: Secondary | ICD-10-CM | POA: Diagnosis not present

## 2020-08-06 DIAGNOSIS — D509 Iron deficiency anemia, unspecified: Secondary | ICD-10-CM | POA: Diagnosis not present

## 2020-08-06 DIAGNOSIS — D631 Anemia in chronic kidney disease: Secondary | ICD-10-CM | POA: Diagnosis not present

## 2020-08-06 DIAGNOSIS — N186 End stage renal disease: Secondary | ICD-10-CM | POA: Diagnosis not present

## 2020-08-06 DIAGNOSIS — Z992 Dependence on renal dialysis: Secondary | ICD-10-CM | POA: Diagnosis not present

## 2020-08-06 DIAGNOSIS — N2581 Secondary hyperparathyroidism of renal origin: Secondary | ICD-10-CM | POA: Diagnosis not present

## 2020-08-08 DIAGNOSIS — L299 Pruritus, unspecified: Secondary | ICD-10-CM | POA: Diagnosis not present

## 2020-08-08 DIAGNOSIS — N2581 Secondary hyperparathyroidism of renal origin: Secondary | ICD-10-CM | POA: Diagnosis not present

## 2020-08-08 DIAGNOSIS — D631 Anemia in chronic kidney disease: Secondary | ICD-10-CM | POA: Diagnosis not present

## 2020-08-08 DIAGNOSIS — D509 Iron deficiency anemia, unspecified: Secondary | ICD-10-CM | POA: Diagnosis not present

## 2020-08-08 DIAGNOSIS — N186 End stage renal disease: Secondary | ICD-10-CM | POA: Diagnosis not present

## 2020-08-08 DIAGNOSIS — Z992 Dependence on renal dialysis: Secondary | ICD-10-CM | POA: Diagnosis not present

## 2020-08-10 DIAGNOSIS — N186 End stage renal disease: Secondary | ICD-10-CM | POA: Diagnosis not present

## 2020-08-10 DIAGNOSIS — D509 Iron deficiency anemia, unspecified: Secondary | ICD-10-CM | POA: Diagnosis not present

## 2020-08-10 DIAGNOSIS — L299 Pruritus, unspecified: Secondary | ICD-10-CM | POA: Diagnosis not present

## 2020-08-10 DIAGNOSIS — N2581 Secondary hyperparathyroidism of renal origin: Secondary | ICD-10-CM | POA: Diagnosis not present

## 2020-08-10 DIAGNOSIS — Z992 Dependence on renal dialysis: Secondary | ICD-10-CM | POA: Diagnosis not present

## 2020-08-10 DIAGNOSIS — D631 Anemia in chronic kidney disease: Secondary | ICD-10-CM | POA: Diagnosis not present

## 2020-08-14 DIAGNOSIS — N186 End stage renal disease: Secondary | ICD-10-CM | POA: Diagnosis not present

## 2020-08-14 DIAGNOSIS — L299 Pruritus, unspecified: Secondary | ICD-10-CM | POA: Diagnosis not present

## 2020-08-14 DIAGNOSIS — N2581 Secondary hyperparathyroidism of renal origin: Secondary | ICD-10-CM | POA: Diagnosis not present

## 2020-08-14 DIAGNOSIS — Z992 Dependence on renal dialysis: Secondary | ICD-10-CM | POA: Diagnosis not present

## 2020-08-14 DIAGNOSIS — D631 Anemia in chronic kidney disease: Secondary | ICD-10-CM | POA: Diagnosis not present

## 2020-08-14 DIAGNOSIS — D509 Iron deficiency anemia, unspecified: Secondary | ICD-10-CM | POA: Diagnosis not present

## 2020-08-17 DIAGNOSIS — D509 Iron deficiency anemia, unspecified: Secondary | ICD-10-CM | POA: Diagnosis not present

## 2020-08-17 DIAGNOSIS — N2581 Secondary hyperparathyroidism of renal origin: Secondary | ICD-10-CM | POA: Diagnosis not present

## 2020-08-17 DIAGNOSIS — Z992 Dependence on renal dialysis: Secondary | ICD-10-CM | POA: Diagnosis not present

## 2020-08-17 DIAGNOSIS — D631 Anemia in chronic kidney disease: Secondary | ICD-10-CM | POA: Diagnosis not present

## 2020-08-17 DIAGNOSIS — N186 End stage renal disease: Secondary | ICD-10-CM | POA: Diagnosis not present

## 2020-08-17 DIAGNOSIS — L299 Pruritus, unspecified: Secondary | ICD-10-CM | POA: Diagnosis not present

## 2020-08-20 DIAGNOSIS — L299 Pruritus, unspecified: Secondary | ICD-10-CM | POA: Diagnosis not present

## 2020-08-20 DIAGNOSIS — N186 End stage renal disease: Secondary | ICD-10-CM | POA: Diagnosis not present

## 2020-08-20 DIAGNOSIS — D631 Anemia in chronic kidney disease: Secondary | ICD-10-CM | POA: Diagnosis not present

## 2020-08-20 DIAGNOSIS — D509 Iron deficiency anemia, unspecified: Secondary | ICD-10-CM | POA: Diagnosis not present

## 2020-08-20 DIAGNOSIS — Z992 Dependence on renal dialysis: Secondary | ICD-10-CM | POA: Diagnosis not present

## 2020-08-20 DIAGNOSIS — N2581 Secondary hyperparathyroidism of renal origin: Secondary | ICD-10-CM | POA: Diagnosis not present

## 2020-08-21 ENCOUNTER — Encounter: Payer: Self-pay | Admitting: Internal Medicine

## 2020-08-21 ENCOUNTER — Ambulatory Visit (INDEPENDENT_AMBULATORY_CARE_PROVIDER_SITE_OTHER): Payer: Medicare Other | Admitting: Internal Medicine

## 2020-08-21 ENCOUNTER — Other Ambulatory Visit: Payer: Self-pay

## 2020-08-21 VITALS — BP 144/66 | HR 91 | Temp 98.4°F | Wt 148.0 lb

## 2020-08-21 DIAGNOSIS — L298 Other pruritus: Secondary | ICD-10-CM | POA: Diagnosis not present

## 2020-08-21 DIAGNOSIS — I129 Hypertensive chronic kidney disease with stage 1 through stage 4 chronic kidney disease, or unspecified chronic kidney disease: Secondary | ICD-10-CM | POA: Diagnosis not present

## 2020-08-21 DIAGNOSIS — Z992 Dependence on renal dialysis: Secondary | ICD-10-CM | POA: Diagnosis not present

## 2020-08-21 DIAGNOSIS — N186 End stage renal disease: Secondary | ICD-10-CM

## 2020-08-21 MED ORDER — GABAPENTIN 100 MG PO CAPS: 100.0000 mg | ORAL_CAPSULE | Freq: Every day | ORAL | 0 refills | Status: DC

## 2020-08-21 NOTE — Progress Notes (Signed)
Subjective:    Patient ID: Jordan [name], male    DOB: 1965/10/18, 54 y.o.   MRN: [id number]  HPI  Pt presents to the clinic today with c/o itching. He reports this is generalized. He noticed this about 6 months ago. He has no history of liver disease. He has CKD, on dialysis. He has seen dermatology in the past but would like a referral today for a second opinion. He has tried Hydroxyzine, Benadryl and multiple antiitch creams without any relief of symptoms.   Review of Systems      Past Medical History:  Diagnosis Date  . Anemia   . Aortic atherosclerosis ([place]) 11/12/2019  . CKD (chronic kidney disease)    Stage 5  Dialysis - M/W/F in [place], [place]  . Dyspnea    tx with inhaler when sick  . ED (erectile dysfunction)   . Emphysema of lung ([place]) 11/12/2019  . ETOH abuse   . History of blood transfusion   . Hypertension   . Wears dentures     Current Outpatient Medications  Medication Sig Dispense Refill  . calcium acetate (PHOSLO) 667 MG capsule Take 2 capsules (1,334 mg total) by mouth 3 (three) times daily with meals. 90 capsule 2  . epoetin alfa (EPOGEN) 4000 UNIT/ML injection Inject 1 mL (4,000 Units total) into the vein every Monday, Wednesday, and Friday with hemodialysis. 1 mL   . hydrOXYzine (ATARAX/VISTARIL) 50 MG tablet Take 1 tablet (50 mg total) by mouth 3 (three) times daily as needed. 30 tablet 0  . lisinopril (ZESTRIL) 10 MG tablet Take 1 tablet (10 mg total) by mouth daily. Blood pressure medication. 30 tablet 2  . OLANZapine (ZYPREXA) 2.5 MG tablet Take 1 tablet (2.5 mg total) by mouth at bedtime. 30 tablet 1  . sevelamer carbonate (RENVELA) 800 MG tablet Take 800 mg by mouth 3 (three) times daily with meals.    . thiamine 100 MG tablet Take 1 tablet (100 mg total) by mouth daily.     No current facility-administered medications for this visit.    No Known Allergies  Family History  Problem Relation Age of Onset  . Diabetes Mother   . Kidney failure  Mother   . Healthy Father   . Kidney failure Brother   . Healthy Sister   . Kidney disease Daughter   . Post-traumatic stress disorder Neg Hx   . Bladder Cancer Neg Hx   . Kidney cancer Neg Hx     Social History   Socioeconomic History  . Marital status: Single    Spouse name: Not on file  . Number of children: Not on file  . Years of education: Not on file  . Highest education level: Not on file  Occupational History  . Not on file  Tobacco Use  . Smoking status: Current Every Day Smoker    Packs/day: 0.50    Years: 40.00    Pack years: 20.00    Types: Cigarettes  . Smokeless tobacco: Never Used  Vaping Use  . Vaping Use: Never used  Substance and Sexual Activity  . Alcohol use: Yes    Alcohol/week: 21.0 standard drinks    Types: 21 Cans of beer per week    Comment: last drink 6 months ago  . Drug use: No  . Sexual activity: Not Currently  Other Topics Concern  . Not on file  Social History Narrative  . Not on file   Social Determinants of Health   Financial  Resource Strain:   . Difficulty of Paying Living Expenses: Not on file  Food Insecurity:   . Worried About Charity fundraiser in the Last Year: Not on file  . Ran Out of Food in the Last Year: Not on file  Transportation Needs:   . Lack of Transportation (Medical): Not on file  . Lack of Transportation (Non-Medical): Not on file  Physical Activity:   . Days of Exercise per Week: Not on file  . Minutes of Exercise per Session: Not on file  Stress:   . Feeling of Stress : Not on file  Social Connections:   . Frequency of Communication with Friends and Family: Not on file  . Frequency of Social Gatherings with Friends and Family: Not on file  . Attends Religious Services: Not on file  . Active Member of Clubs or Organizations: Not on file  . Attends Archivist Meetings: Not on file  . Marital Status: Not on file  Intimate Partner Violence:   . Fear of Current or Ex-Partner: Not on file  .  Emotionally Abused: Not on file  . Physically Abused: Not on file  . Sexually Abused: Not on file     Constitutional: Denies fever, malaise, fatigue, headache or abrupt weight changes.  Respiratory: Denies difficulty breathing, shortness of breath, cough or sputum production.   Cardiovascular: Denies chest pain, chest tightness, palpitations or swelling in the hands or feet.  Skin: Pt reports itching. Denies redness, rashes, lesions or ulcercations.    No other specific complaints in a complete review of systems (except as listed in HPI above).  Objective:   Physical Exam  BP (!) 144/66   Pulse 91   Temp 98.4 F (36.9 C) (Temporal)   Wt 148 lb (67.1 kg)   SpO2 95%   BMI 25.40 kg/m   Wt Readings from Last 3 Encounters:  06/20/20 160 lb 0.9 oz (72.6 kg)  06/06/20 (S) 149 lb 14.6 oz (68 kg)  06/04/20 170 lb (77.1 kg)    General: Appears his stated age, well developed, well nourished in NAD. Skin: Scattered excoriation and scabbed areas noted on chin, chest, bilateral arms, buttocks and lower legs. Cardiovascular: Normal rate and rhythm.  Pulmonary/Chest: Normal effort and positive vesicular breath sounds. No respiratory distress. No wheezes, rales or ronchi noted.  Neurological: Alert and oriented.   BMET    Component Value Date/Time   NA 142 06/20/2020 0414   K 5.2 (H) 06/20/2020 0414   CL 99 06/20/2020 0414   CO2 24 06/20/2020 0414   GLUCOSE 108 (H) 06/20/2020 0414   BUN 91 (H) 06/20/2020 0414   CREATININE 14.78 (H) 06/20/2020 0414   CALCIUM 9.0 06/20/2020 0414   GFRNONAA 3 (L) 06/20/2020 0414   GFRAA 4 (L) 06/20/2020 0414    Lipid Panel     Component Value Date/Time   CHOL 145 05/17/2018 0359   TRIG 67 05/17/2018 0359   HDL 34 (L) 05/17/2018 0359   CHOLHDL 4.3 05/17/2018 0359   VLDL 13 05/17/2018 0359   LDLCALC 98 05/17/2018 0359    CBC    Component Value Date/Time   WBC 8.3 06/20/2020 0414   RBC 2.26 (L) 06/20/2020 0414   HGB 7.0 (L) 06/20/2020  0414   HCT 22.1 (L) 06/20/2020 0414   PLT 151 06/20/2020 0414   MCV 97.8 06/20/2020 0414   MCH 31.0 06/20/2020 0414   MCHC 31.7 06/20/2020 0414   RDW 16.0 (H) 06/20/2020 0414   LYMPHSABS  0.7 06/06/2020 1200   MONOABS 0.5 06/06/2020 1200   EOSABS 0.2 06/06/2020 1200   BASOSABS 0.0 06/06/2020 1200    Hgb A1C Lab Results  Component Value Date   HGBA1C 5.0 04/07/2020            Assessment & Plan:   Uremic Pruritus:  He has failed antihistamines and creams OTC Will trial Gabepentin 100 mg at bedtime- sedation caution given Referral to dermatology placed for further evaluation of symptoms  Return precautions discussed Webb Silversmith, NP This visit occurred during the SARS-CoV-2 public health emergency.  Safety protocols were in place, including screening questions prior to the visit, additional usage of staff PPE, and extensive cleaning of exam room while observing appropriate contact time as indicated for disinfecting solutions.

## 2020-08-21 NOTE — Patient Instructions (Signed)
Pruritus Pruritus is an itchy feeling on the skin. One of the most common causes is dry skin, but many different things can cause itching. Most cases of itching do not require medical attention. Sometimes itchy skin can turn into a rash. Follow these instructions at home: Skin care   Apply moisturizing lotion to your skin as needed. Lotion that contains petroleum jelly is best.  Take medicines or apply medicated creams only as told by your health care provider. This may include: ? Corticosteroid cream. ? Anti-itch lotions. ? Oral antihistamines.  Apply a cool, wet cloth (cool compress) to the affected areas.  Take baths with one of the following: ? Epsom salts. You can get these at your local pharmacy or grocery store. Follow the instructions on the packaging. ? Baking soda. Pour a small amount into the bath as told by your health care provider. ? Colloidal oatmeal. You can get this at your local pharmacy or grocery store. Follow the instructions on the packaging.  Apply baking soda paste to your skin. To make the paste, stir water into a small amount of baking soda until it reaches a paste-like consistency.  Do not scratch your skin.  Do not take hot showers or baths, which can make itching worse. A cool shower may help with itching as long as you apply moisturizing lotion after the shower.  Do not use scented soaps, detergents, perfumes, and cosmetic products. Instead, use gentle, unscented versions of these items. General instructions  Avoid wearing tight clothes.  Keep a journal to help find out what is causing your itching. Write down: ? What you eat and drink. ? What cosmetic products you use. ? What soaps or detergents you use. ? What you wear, including jewelry.  Use a humidifier. This keeps the air moist, which helps to prevent dry skin.  Be aware of any changes in your itchiness. Contact a health care provider if:  The itching does not go away after several  days.  You are unusually thirsty or urinating more than normal.  Your skin tingles or feels numb.  Your skin or the white parts of your eyes turn yellow (jaundice).  You feel weak.  You have any of the following: ? Night sweats. ? Tiredness (fatigue). ? Weight loss. ? Abdominal pain. Summary  Pruritus is an itchy feeling on the skin. One of the most common causes is dry skin, but many different conditions and factors can cause itching.  Apply moisturizing lotion to your skin as needed. Lotion that contains petroleum jelly is best.  Take medicines or apply medicated creams only as told by your health care provider.  Do not take hot showers or baths. Do not use scented soaps, detergents, perfumes, or cosmetic products. This information is not intended to replace advice given to you by your health care provider. Make sure you discuss any questions you have with your health care provider. Document Revised: 09/22/2017 Document Reviewed: 09/22/2017 Elsevier Patient Education  2020 [employer]vier Inc.  

## 2020-08-22 DIAGNOSIS — I509 Heart failure, unspecified: Secondary | ICD-10-CM | POA: Diagnosis not present

## 2020-08-24 DIAGNOSIS — D509 Iron deficiency anemia, unspecified: Secondary | ICD-10-CM | POA: Diagnosis not present

## 2020-08-24 DIAGNOSIS — N186 End stage renal disease: Secondary | ICD-10-CM | POA: Diagnosis not present

## 2020-08-24 DIAGNOSIS — R52 Pain, unspecified: Secondary | ICD-10-CM | POA: Diagnosis not present

## 2020-08-24 DIAGNOSIS — Z992 Dependence on renal dialysis: Secondary | ICD-10-CM | POA: Diagnosis not present

## 2020-08-24 DIAGNOSIS — N2581 Secondary hyperparathyroidism of renal origin: Secondary | ICD-10-CM | POA: Diagnosis not present

## 2020-08-24 DIAGNOSIS — L299 Pruritus, unspecified: Secondary | ICD-10-CM | POA: Diagnosis not present

## 2020-08-27 DIAGNOSIS — N186 End stage renal disease: Secondary | ICD-10-CM | POA: Diagnosis not present

## 2020-08-27 DIAGNOSIS — D509 Iron deficiency anemia, unspecified: Secondary | ICD-10-CM | POA: Diagnosis not present

## 2020-08-27 DIAGNOSIS — N2581 Secondary hyperparathyroidism of renal origin: Secondary | ICD-10-CM | POA: Diagnosis not present

## 2020-08-27 DIAGNOSIS — R52 Pain, unspecified: Secondary | ICD-10-CM | POA: Diagnosis not present

## 2020-08-27 DIAGNOSIS — Z992 Dependence on renal dialysis: Secondary | ICD-10-CM | POA: Diagnosis not present

## 2020-08-27 DIAGNOSIS — L299 Pruritus, unspecified: Secondary | ICD-10-CM | POA: Diagnosis not present

## 2020-08-31 DIAGNOSIS — N186 End stage renal disease: Secondary | ICD-10-CM | POA: Diagnosis not present

## 2020-08-31 DIAGNOSIS — Z992 Dependence on renal dialysis: Secondary | ICD-10-CM | POA: Diagnosis not present

## 2020-08-31 DIAGNOSIS — D509 Iron deficiency anemia, unspecified: Secondary | ICD-10-CM | POA: Diagnosis not present

## 2020-08-31 DIAGNOSIS — R52 Pain, unspecified: Secondary | ICD-10-CM | POA: Diagnosis not present

## 2020-08-31 DIAGNOSIS — L299 Pruritus, unspecified: Secondary | ICD-10-CM | POA: Diagnosis not present

## 2020-08-31 DIAGNOSIS — N2581 Secondary hyperparathyroidism of renal origin: Secondary | ICD-10-CM | POA: Diagnosis not present

## 2020-09-05 DIAGNOSIS — N186 End stage renal disease: Secondary | ICD-10-CM | POA: Diagnosis not present

## 2020-09-05 DIAGNOSIS — R52 Pain, unspecified: Secondary | ICD-10-CM | POA: Diagnosis not present

## 2020-09-05 DIAGNOSIS — D509 Iron deficiency anemia, unspecified: Secondary | ICD-10-CM | POA: Diagnosis not present

## 2020-09-05 DIAGNOSIS — Z992 Dependence on renal dialysis: Secondary | ICD-10-CM | POA: Diagnosis not present

## 2020-09-05 DIAGNOSIS — N2581 Secondary hyperparathyroidism of renal origin: Secondary | ICD-10-CM | POA: Diagnosis not present

## 2020-09-05 DIAGNOSIS — L299 Pruritus, unspecified: Secondary | ICD-10-CM | POA: Diagnosis not present

## 2020-09-06 ENCOUNTER — Telehealth: Payer: Self-pay

## 2020-09-06 NOTE — Telephone Encounter (Signed)
Line continues to remain busy. Sending note to [facility] LPN and [facility] CMA.

## 2020-09-06 NOTE — Telephone Encounter (Signed)
Pt left v/m requesting cb and that was entire message. Called pt x 2 phone busy. Will try later.

## 2020-09-07 DIAGNOSIS — N186 End stage renal disease: Secondary | ICD-10-CM | POA: Diagnosis not present

## 2020-09-07 DIAGNOSIS — N2581 Secondary hyperparathyroidism of renal origin: Secondary | ICD-10-CM | POA: Diagnosis not present

## 2020-09-07 DIAGNOSIS — L299 Pruritus, unspecified: Secondary | ICD-10-CM | POA: Diagnosis not present

## 2020-09-07 DIAGNOSIS — R52 Pain, unspecified: Secondary | ICD-10-CM | POA: Diagnosis not present

## 2020-09-07 DIAGNOSIS — Z992 Dependence on renal dialysis: Secondary | ICD-10-CM | POA: Diagnosis not present

## 2020-09-07 DIAGNOSIS — D509 Iron deficiency anemia, unspecified: Secondary | ICD-10-CM | POA: Diagnosis not present

## 2020-09-07 NOTE — Telephone Encounter (Signed)
Phone is ringing busy; only contact # for pt; no DPR signed. Sending note to [facility] CMA.

## 2020-09-10 DIAGNOSIS — N2581 Secondary hyperparathyroidism of renal origin: Secondary | ICD-10-CM | POA: Diagnosis not present

## 2020-09-10 DIAGNOSIS — D509 Iron deficiency anemia, unspecified: Secondary | ICD-10-CM | POA: Diagnosis not present

## 2020-09-10 DIAGNOSIS — R52 Pain, unspecified: Secondary | ICD-10-CM | POA: Diagnosis not present

## 2020-09-10 DIAGNOSIS — Z992 Dependence on renal dialysis: Secondary | ICD-10-CM | POA: Diagnosis not present

## 2020-09-10 DIAGNOSIS — N186 End stage renal disease: Secondary | ICD-10-CM | POA: Diagnosis not present

## 2020-09-10 DIAGNOSIS — L299 Pruritus, unspecified: Secondary | ICD-10-CM | POA: Diagnosis not present

## 2020-09-11 NOTE — Telephone Encounter (Signed)
Patient left a voicemail requesting a call back. Tried to call patient back and got his voicemail. Message was that mailbox is full so unable to leave a message.  Will wait for patient to call back.

## 2020-09-17 DIAGNOSIS — D509 Iron deficiency anemia, unspecified: Secondary | ICD-10-CM | POA: Diagnosis not present

## 2020-09-17 DIAGNOSIS — R52 Pain, unspecified: Secondary | ICD-10-CM | POA: Diagnosis not present

## 2020-09-17 DIAGNOSIS — Z992 Dependence on renal dialysis: Secondary | ICD-10-CM | POA: Diagnosis not present

## 2020-09-17 DIAGNOSIS — N2581 Secondary hyperparathyroidism of renal origin: Secondary | ICD-10-CM | POA: Diagnosis not present

## 2020-09-17 DIAGNOSIS — L299 Pruritus, unspecified: Secondary | ICD-10-CM | POA: Diagnosis not present

## 2020-09-17 DIAGNOSIS — N186 End stage renal disease: Secondary | ICD-10-CM | POA: Diagnosis not present

## 2020-09-17 NOTE — Telephone Encounter (Signed)
Pt left v/m requesting cb; that was entire message; I called pt and no answer and v/m is full. Sending note to [facility] CMA.

## 2020-09-18 NOTE — Telephone Encounter (Signed)
[facility] - Client TELEPHONE ADVICE RECORD [facility] Patient Name: Jordan [name] Gender: Male DOB: [birth year] Age: 54 Y 21 D Return Phone Number: [phone] (Primary) [name] [address] Client [facility] - Client Client Site [place] Physician Webb Silversmith - NP Contact Type Call Who Is Calling Patient / Member / Family / Caregiver Call Type Triage / Clinical Relationship To Patient Self Return Phone Number [phone] (Primary) Chief Complaint Insomnia Reason for Call Symptomatic / Request for [place] states has a real bad two day sleeplessness Translation No Disp. Time Eilene Ghazi Time) Disposition Final User 09/17/2020 7:59:42 PM Attempt made - no message left [place], RN, Estill Bamberg 09/17/2020 8:24:21 PM FINAL ATTEMPT MADE - no message left [place], RN, Estill Bamberg 09/17/2020 8:24:30 PM Send to RN Final Attempt Queue [place], RN, Estill Bamberg 09/17/2020 10:07:57 PM Clinical Call Yes Junius Creamer, RN, Debra Comments User: Rozelle Logan, RN Date/Time Eilene Ghazi Time): 09/17/2020 7:59:30 PM voice mail was a male. did not leave message User: Sandi Carne, RN Date/Time Eilene Ghazi Time): 09/17/2020 10:07:51 PM person answering the phone states it is a wrong number.

## 2020-09-18 NOTE — Telephone Encounter (Signed)
Referral re sent to Dr hall's office again

## 2020-09-18 NOTE — Telephone Encounter (Signed)
Melanie CMA said she has spoken with pt x 2 this morning and pt is concerned with his itching. Threasa Beards said pt has dermatology referral and nothing further needed. I spoke with pt and he said he is not sleeping well for the last 2 nights due to itching. Pt is going to have appt with dermatology; nothing further needed.

## 2020-09-18 NOTE — Telephone Encounter (Signed)
I spoke to pt and he stated he called the office and scheduled an appt for 1/11 at 3pm but the office told him that he needed another referral.

## 2020-09-19 ENCOUNTER — Telehealth: Payer: Self-pay | Admitting: Internal Medicine

## 2020-09-19 DIAGNOSIS — Z992 Dependence on renal dialysis: Secondary | ICD-10-CM | POA: Diagnosis not present

## 2020-09-19 DIAGNOSIS — N186 End stage renal disease: Secondary | ICD-10-CM | POA: Diagnosis not present

## 2020-09-19 DIAGNOSIS — N2581 Secondary hyperparathyroidism of renal origin: Secondary | ICD-10-CM | POA: Diagnosis not present

## 2020-09-19 DIAGNOSIS — R52 Pain, unspecified: Secondary | ICD-10-CM | POA: Diagnosis not present

## 2020-09-19 DIAGNOSIS — D509 Iron deficiency anemia, unspecified: Secondary | ICD-10-CM | POA: Diagnosis not present

## 2020-09-19 DIAGNOSIS — L299 Pruritus, unspecified: Secondary | ICD-10-CM | POA: Diagnosis not present

## 2020-09-19 NOTE — Telephone Encounter (Signed)
Please see previous note. EM

## 2020-09-19 NOTE — Telephone Encounter (Signed)
Patient called in I could be barely understand him because he was mumbling and all over the place. I was trying to get info so I knew how to help him. The patient said to me that he say the dr and a referral was sent in to the dermatologist and he hadn't received a call for an appt so I said do you need to discuss a referral or something else,  he yelled at me and said " What I am fucking saying to you is I can't stop itching. Can't you get me something other than a pill?" I told him to please refrain from yelling and cussing at me and I would see what I could do. I kindly asked to place him on hold and he disconnected before I could get to his answer and get a message in.

## 2020-09-22 DIAGNOSIS — I509 Heart failure, unspecified: Secondary | ICD-10-CM | POA: Diagnosis not present

## 2020-09-24 DIAGNOSIS — N186 End stage renal disease: Secondary | ICD-10-CM | POA: Diagnosis not present

## 2020-09-24 DIAGNOSIS — L299 Pruritus, unspecified: Secondary | ICD-10-CM | POA: Diagnosis not present

## 2020-09-24 DIAGNOSIS — D631 Anemia in chronic kidney disease: Secondary | ICD-10-CM | POA: Diagnosis not present

## 2020-09-24 DIAGNOSIS — Z992 Dependence on renal dialysis: Secondary | ICD-10-CM | POA: Diagnosis not present

## 2020-09-24 DIAGNOSIS — R52 Pain, unspecified: Secondary | ICD-10-CM | POA: Diagnosis not present

## 2020-09-24 DIAGNOSIS — D509 Iron deficiency anemia, unspecified: Secondary | ICD-10-CM | POA: Diagnosis not present

## 2020-09-24 DIAGNOSIS — N2581 Secondary hyperparathyroidism of renal origin: Secondary | ICD-10-CM | POA: Diagnosis not present

## 2020-09-26 DIAGNOSIS — N2581 Secondary hyperparathyroidism of renal origin: Secondary | ICD-10-CM | POA: Diagnosis not present

## 2020-09-26 DIAGNOSIS — N186 End stage renal disease: Secondary | ICD-10-CM | POA: Diagnosis not present

## 2020-09-26 DIAGNOSIS — Z992 Dependence on renal dialysis: Secondary | ICD-10-CM | POA: Diagnosis not present

## 2020-09-26 DIAGNOSIS — D631 Anemia in chronic kidney disease: Secondary | ICD-10-CM | POA: Diagnosis not present

## 2020-09-26 DIAGNOSIS — R52 Pain, unspecified: Secondary | ICD-10-CM | POA: Diagnosis not present

## 2020-09-26 DIAGNOSIS — D509 Iron deficiency anemia, unspecified: Secondary | ICD-10-CM | POA: Diagnosis not present

## 2020-09-26 DIAGNOSIS — L299 Pruritus, unspecified: Secondary | ICD-10-CM | POA: Diagnosis not present

## 2020-09-28 DIAGNOSIS — D509 Iron deficiency anemia, unspecified: Secondary | ICD-10-CM | POA: Diagnosis not present

## 2020-09-28 DIAGNOSIS — R52 Pain, unspecified: Secondary | ICD-10-CM | POA: Diagnosis not present

## 2020-09-28 DIAGNOSIS — L299 Pruritus, unspecified: Secondary | ICD-10-CM | POA: Diagnosis not present

## 2020-09-28 DIAGNOSIS — N2581 Secondary hyperparathyroidism of renal origin: Secondary | ICD-10-CM | POA: Diagnosis not present

## 2020-09-28 DIAGNOSIS — Z992 Dependence on renal dialysis: Secondary | ICD-10-CM | POA: Diagnosis not present

## 2020-09-28 DIAGNOSIS — N186 End stage renal disease: Secondary | ICD-10-CM | POA: Diagnosis not present

## 2020-09-28 DIAGNOSIS — D631 Anemia in chronic kidney disease: Secondary | ICD-10-CM | POA: Diagnosis not present

## 2020-10-01 ENCOUNTER — Telehealth: Payer: Self-pay | Admitting: *Deleted

## 2020-10-01 NOTE — Telephone Encounter (Signed)
Patient left a voicemail requesting a call back. Tried to call back and got his voicemail. Unable to leave a message before his voicemail was full. Will wait for patient to call the office back.

## 2020-10-02 DIAGNOSIS — L298 Other pruritus: Secondary | ICD-10-CM | POA: Diagnosis not present

## 2020-10-02 NOTE — Telephone Encounter (Signed)
Tried to call patient back and unable to leave a message because his mailbox was full. Will wait for patient to call the office back.

## 2020-10-03 DIAGNOSIS — L299 Pruritus, unspecified: Secondary | ICD-10-CM | POA: Diagnosis not present

## 2020-10-03 DIAGNOSIS — Z992 Dependence on renal dialysis: Secondary | ICD-10-CM | POA: Diagnosis not present

## 2020-10-03 DIAGNOSIS — D631 Anemia in chronic kidney disease: Secondary | ICD-10-CM | POA: Diagnosis not present

## 2020-10-03 DIAGNOSIS — N2581 Secondary hyperparathyroidism of renal origin: Secondary | ICD-10-CM | POA: Diagnosis not present

## 2020-10-03 DIAGNOSIS — R52 Pain, unspecified: Secondary | ICD-10-CM | POA: Diagnosis not present

## 2020-10-03 DIAGNOSIS — N186 End stage renal disease: Secondary | ICD-10-CM | POA: Diagnosis not present

## 2020-10-03 DIAGNOSIS — D509 Iron deficiency anemia, unspecified: Secondary | ICD-10-CM | POA: Diagnosis not present

## 2020-10-05 DIAGNOSIS — Z992 Dependence on renal dialysis: Secondary | ICD-10-CM | POA: Diagnosis not present

## 2020-10-05 DIAGNOSIS — N186 End stage renal disease: Secondary | ICD-10-CM | POA: Diagnosis not present

## 2020-10-05 DIAGNOSIS — D509 Iron deficiency anemia, unspecified: Secondary | ICD-10-CM | POA: Diagnosis not present

## 2020-10-05 DIAGNOSIS — R52 Pain, unspecified: Secondary | ICD-10-CM | POA: Diagnosis not present

## 2020-10-05 DIAGNOSIS — D631 Anemia in chronic kidney disease: Secondary | ICD-10-CM | POA: Diagnosis not present

## 2020-10-05 DIAGNOSIS — N2581 Secondary hyperparathyroidism of renal origin: Secondary | ICD-10-CM | POA: Diagnosis not present

## 2020-10-05 DIAGNOSIS — L299 Pruritus, unspecified: Secondary | ICD-10-CM | POA: Diagnosis not present

## 2020-10-09 ENCOUNTER — Emergency Department (HOSPITAL_COMMUNITY): Payer: Medicare Other

## 2020-10-09 ENCOUNTER — Other Ambulatory Visit: Payer: Self-pay

## 2020-10-09 ENCOUNTER — Encounter (HOSPITAL_COMMUNITY): Payer: Self-pay | Admitting: *Deleted

## 2020-10-09 ENCOUNTER — Emergency Department (HOSPITAL_COMMUNITY)
Admission: EM | Admit: 2020-10-09 | Discharge: 2020-10-10 | Disposition: A | Discharge status: Home / Self Care | Payer: Medicare Other | Attending: Emergency Medicine | Admitting: Emergency Medicine

## 2020-10-09 DIAGNOSIS — F17210 Nicotine dependence, cigarettes, uncomplicated: Secondary | ICD-10-CM | POA: Diagnosis not present

## 2020-10-09 DIAGNOSIS — I120 Hypertensive chronic kidney disease with stage 5 chronic kidney disease or end stage renal disease: Secondary | ICD-10-CM | POA: Diagnosis not present

## 2020-10-09 DIAGNOSIS — I132 Hypertensive heart and chronic kidney disease with heart failure and with stage 5 chronic kidney disease, or end stage renal disease: Secondary | ICD-10-CM | POA: Diagnosis not present

## 2020-10-09 DIAGNOSIS — R21 Rash and other nonspecific skin eruption: Secondary | ICD-10-CM | POA: Diagnosis not present

## 2020-10-09 DIAGNOSIS — N186 End stage renal disease: Secondary | ICD-10-CM

## 2020-10-09 DIAGNOSIS — R000 Tachycardia, unspecified: Secondary | ICD-10-CM | POA: Insufficient documentation

## 2020-10-09 DIAGNOSIS — I700 Atherosclerosis of aorta: Secondary | ICD-10-CM | POA: Diagnosis not present

## 2020-10-09 DIAGNOSIS — Z79899 Other long term (current) drug therapy: Secondary | ICD-10-CM | POA: Insufficient documentation

## 2020-10-09 DIAGNOSIS — R0902 Hypoxemia: Secondary | ICD-10-CM | POA: Diagnosis not present

## 2020-10-09 DIAGNOSIS — L309 Dermatitis, unspecified: Secondary | ICD-10-CM

## 2020-10-09 DIAGNOSIS — I509 Heart failure, unspecified: Secondary | ICD-10-CM | POA: Diagnosis not present

## 2020-10-09 DIAGNOSIS — Z992 Dependence on renal dialysis: Secondary | ICD-10-CM | POA: Insufficient documentation

## 2020-10-09 DIAGNOSIS — E8770 Fluid overload, unspecified: Secondary | ICD-10-CM | POA: Diagnosis not present

## 2020-10-09 DIAGNOSIS — R0602 Shortness of breath: Secondary | ICD-10-CM | POA: Diagnosis not present

## 2020-10-09 DIAGNOSIS — I517 Cardiomegaly: Secondary | ICD-10-CM | POA: Diagnosis not present

## 2020-10-09 DIAGNOSIS — Z743 Need for continuous supervision: Secondary | ICD-10-CM | POA: Diagnosis not present

## 2020-10-09 DIAGNOSIS — R6889 Other general symptoms and signs: Secondary | ICD-10-CM | POA: Diagnosis not present

## 2020-10-09 DIAGNOSIS — R059 Cough, unspecified: Secondary | ICD-10-CM | POA: Diagnosis not present

## 2020-10-09 LAB — BASIC METABOLIC PANEL
Anion gap: 21 — ABNORMAL HIGH (ref 5–15)
BUN: 84 mg/dL — ABNORMAL HIGH (ref 6–20)
CO2: 21 mmol/L — ABNORMAL LOW (ref 22–32)
Calcium: 9.9 mg/dL (ref 8.9–10.3)
Chloride: 100 mmol/L (ref 98–111)
Creatinine, Ser: 17.7 mg/dL — ABNORMAL HIGH (ref 0.61–1.24)
GFR, Estimated: 3 mL/min — ABNORMAL LOW (ref >60)
Glucose, Bld: 107 mg/dL — ABNORMAL HIGH (ref 70–99)
Potassium: 5.1 mmol/L (ref 3.5–5.1)
Sodium: 142 mmol/L (ref 135–145)

## 2020-10-09 LAB — CBC
HCT: 28.8 % — ABNORMAL LOW (ref 39.0–52.0)
Hemoglobin: 9.1 g/dL — ABNORMAL LOW (ref 13.0–17.0)
MCH: 32.2 pg (ref 26.0–34.0)
MCHC: 31.6 g/dL (ref 30.0–36.0)
MCV: 101.8 fL — ABNORMAL HIGH (ref 80.0–100.0)
Platelets: 131 10*3/uL — ABNORMAL LOW (ref 150–400)
RBC: 2.83 MIL/uL — ABNORMAL LOW (ref 4.22–5.81)
RDW: 16.6 % — ABNORMAL HIGH (ref 11.5–15.5)
WBC: 7 10*3/uL (ref 4.0–10.5)
nRBC: 0 % (ref 0.0–0.2)

## 2020-10-09 IMAGING — CR DG CHEST 2V
2 series · 2 of 2 positions shown · non-contrast
Comparison: [DATE]

CLINICAL DATA: Shortness of breath x1 day.

EXAM:
CHEST - 2 VIEW

[chest lat]
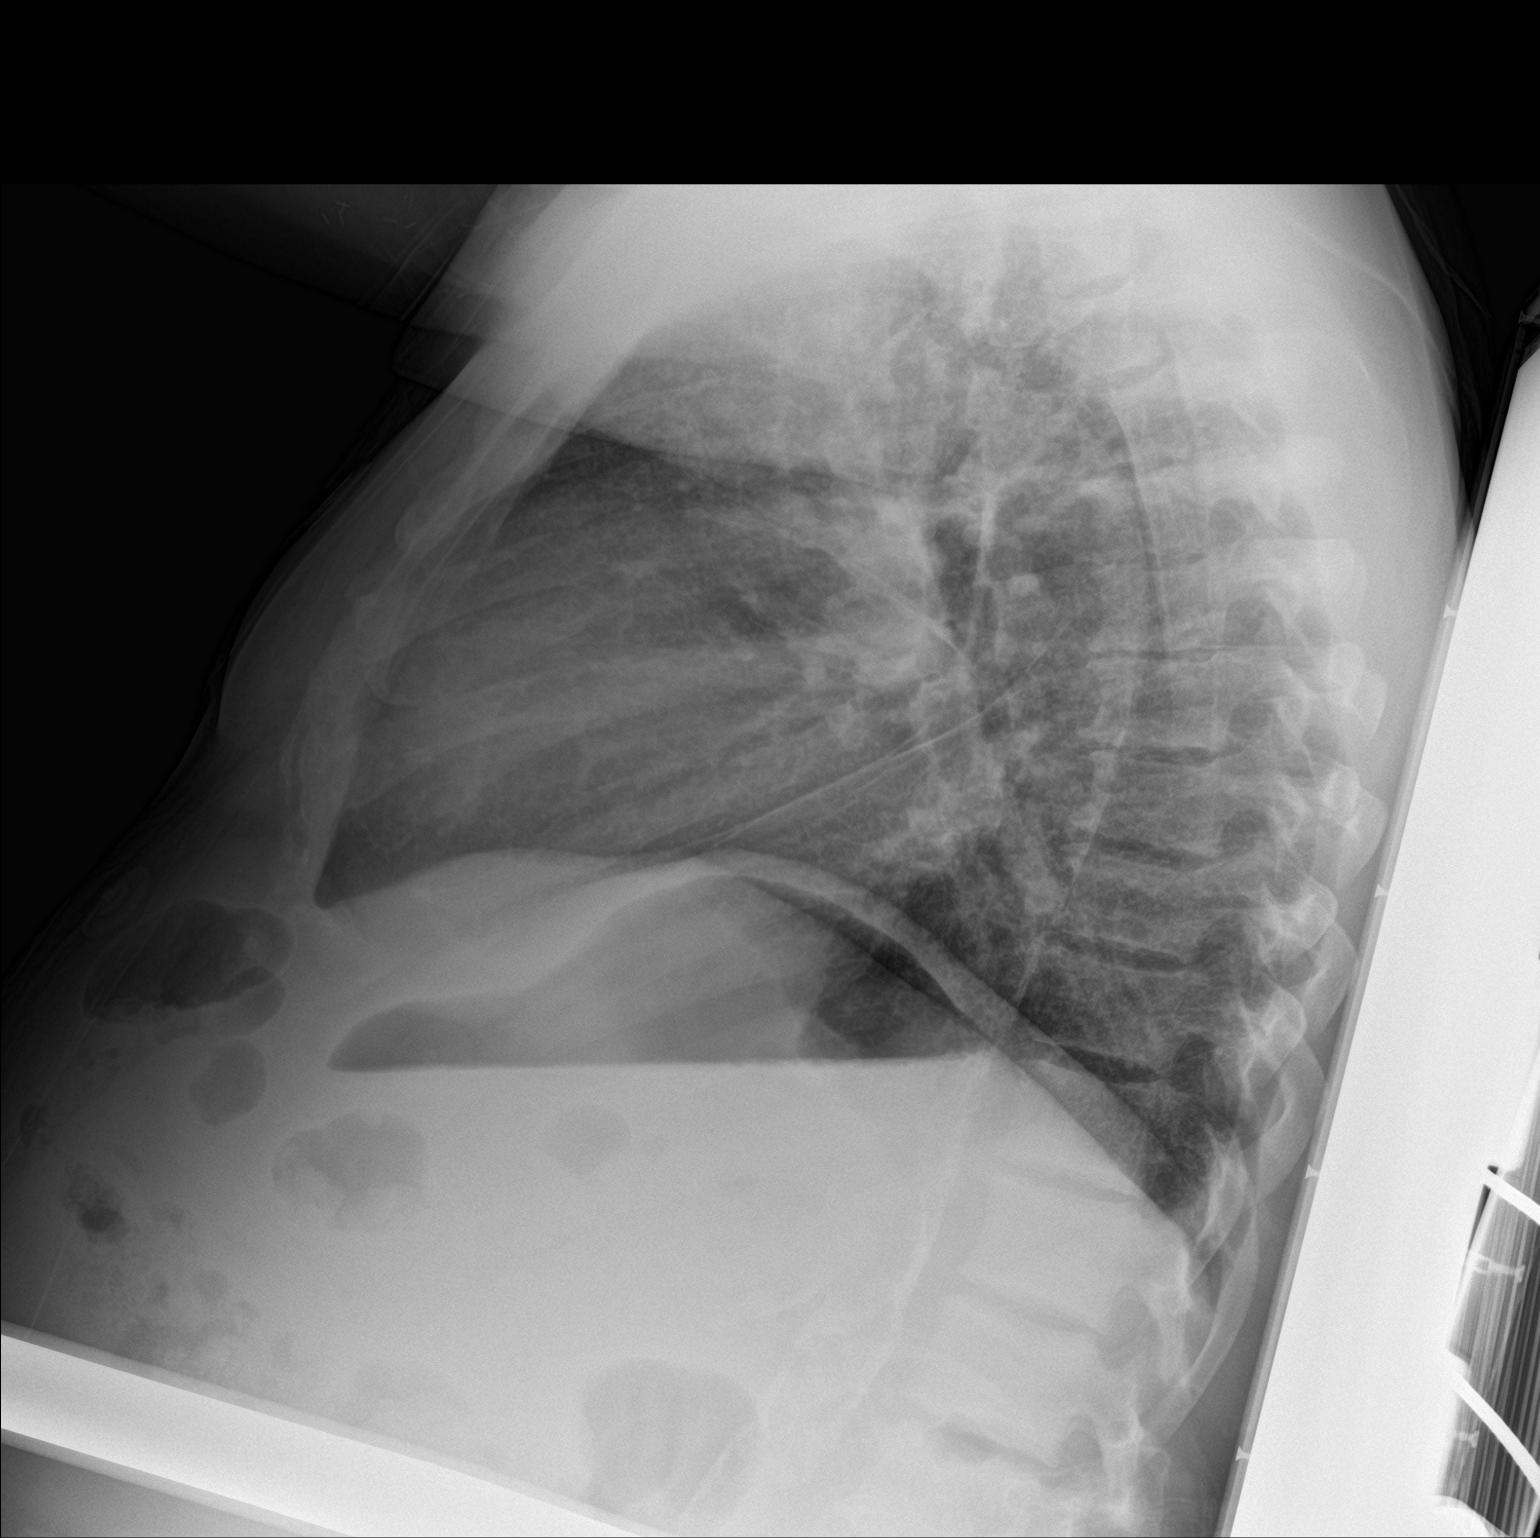

[chest ap]
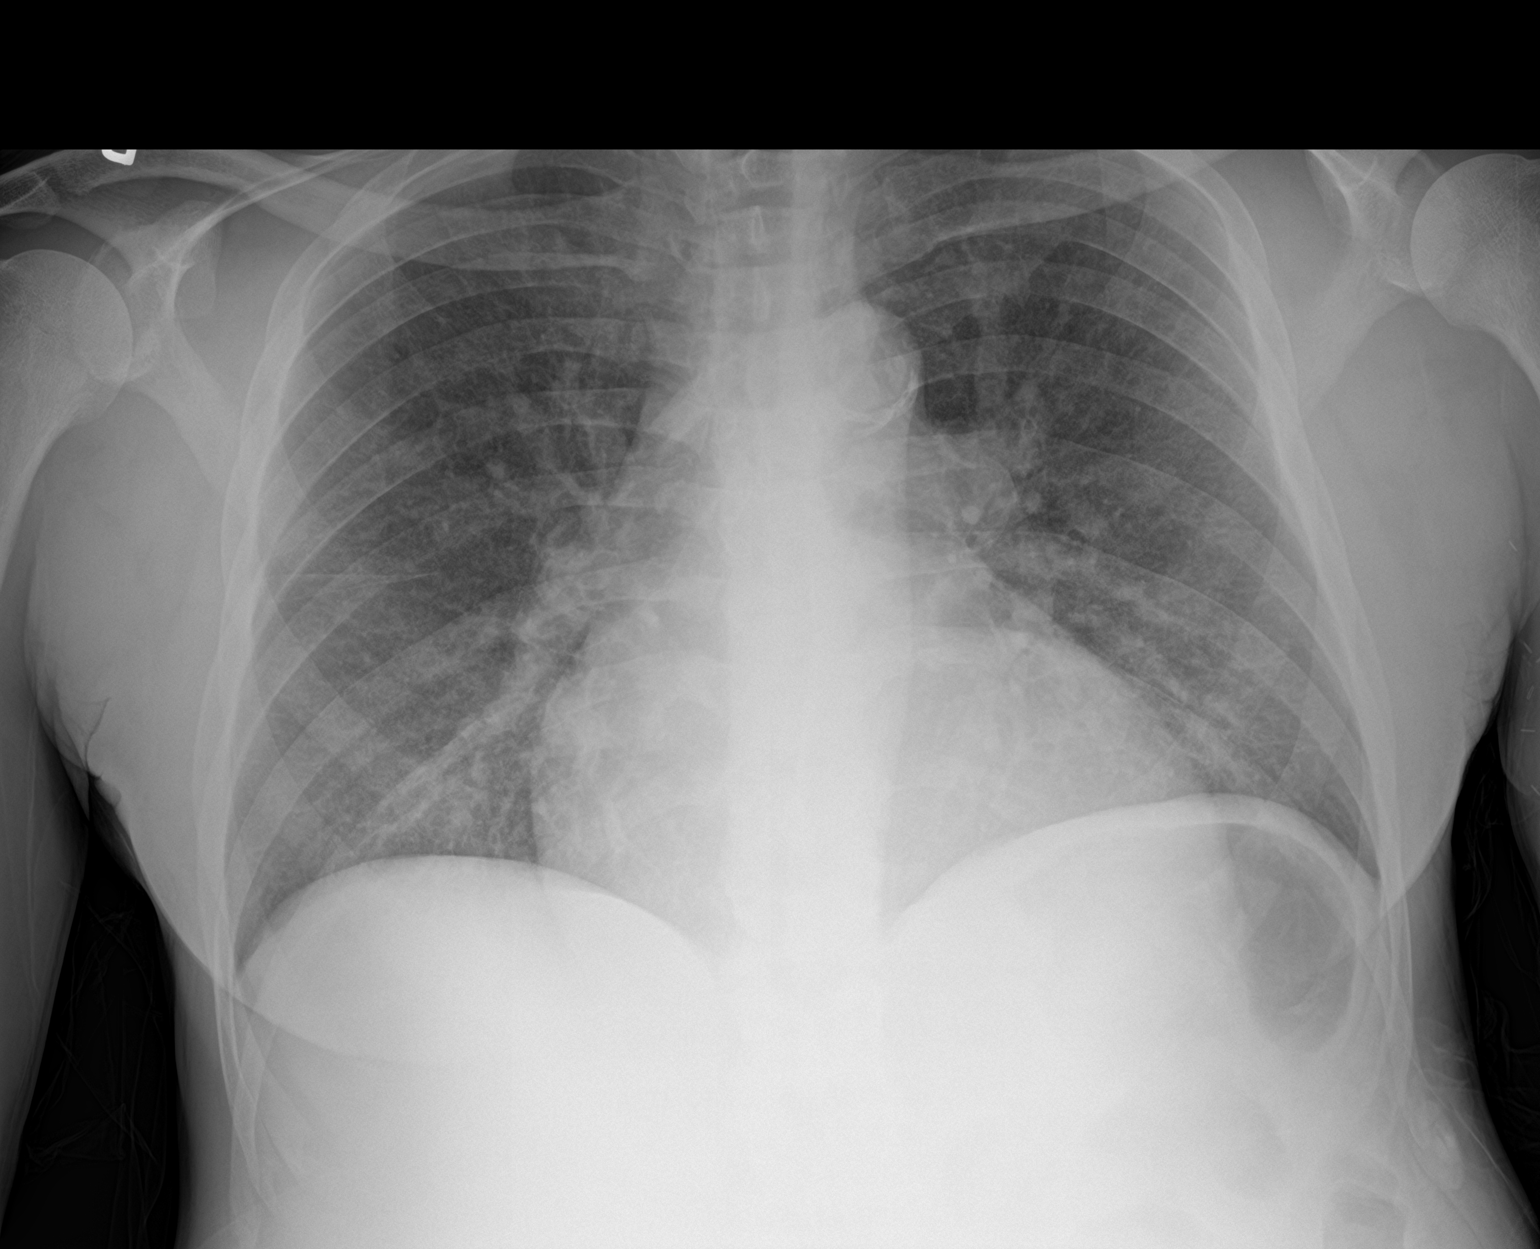

[2 of 2 positions shown; findings below may reference images not displayed]

FINDINGS: Mild, diffusely increased interstitial lung markings are seen. There
is no evidence of a pleural effusion or pneumothorax. Stable mild to
moderate severity enlargement of the cardiac silhouette is seen.
There is moderate severity calcification of the aortic arch. The
visualized skeletal structures are unremarkable.
IMPRESSION: Stable cardiomegaly with mild interstitial edema.

## 2020-10-09 NOTE — ED Triage Notes (Signed)
Pt arrived by [facility]. Reports dialysis treatments being cut short over past several weeks due to itching. Now has swelling to abd and sob. No acute resp distress is noted.

## 2020-10-10 DIAGNOSIS — R0602 Shortness of breath: Secondary | ICD-10-CM | POA: Diagnosis not present

## 2020-10-10 DIAGNOSIS — N2581 Secondary hyperparathyroidism of renal origin: Secondary | ICD-10-CM | POA: Diagnosis not present

## 2020-10-10 DIAGNOSIS — L299 Pruritus, unspecified: Secondary | ICD-10-CM | POA: Diagnosis not present

## 2020-10-10 DIAGNOSIS — Z743 Need for continuous supervision: Secondary | ICD-10-CM | POA: Diagnosis not present

## 2020-10-10 DIAGNOSIS — R0902 Hypoxemia: Secondary | ICD-10-CM | POA: Diagnosis not present

## 2020-10-10 DIAGNOSIS — D509 Iron deficiency anemia, unspecified: Secondary | ICD-10-CM | POA: Diagnosis not present

## 2020-10-10 DIAGNOSIS — R52 Pain, unspecified: Secondary | ICD-10-CM | POA: Diagnosis not present

## 2020-10-10 DIAGNOSIS — Z992 Dependence on renal dialysis: Secondary | ICD-10-CM | POA: Diagnosis not present

## 2020-10-10 DIAGNOSIS — R5381 Other malaise: Secondary | ICD-10-CM | POA: Diagnosis not present

## 2020-10-10 DIAGNOSIS — N186 End stage renal disease: Secondary | ICD-10-CM | POA: Diagnosis not present

## 2020-10-10 DIAGNOSIS — D631 Anemia in chronic kidney disease: Secondary | ICD-10-CM | POA: Diagnosis not present

## 2020-10-10 DIAGNOSIS — R279 Unspecified lack of coordination: Secondary | ICD-10-CM | POA: Diagnosis not present

## 2020-10-10 MED ORDER — PREDNISONE 10 MG PO TABS: 20.0000 mg | ORAL_TABLET | Freq: Two times a day (BID) | ORAL | 0 refills | Status: DC

## 2020-10-10 MED ORDER — CEPHALEXIN 500 MG PO CAPS: 500.0000 mg | ORAL_CAPSULE | Freq: Two times a day (BID) | ORAL | 0 refills | Status: DC

## 2020-10-10 MED ORDER — PREDNISONE 20 MG PO TABS
20.0000 mg | ORAL_TABLET | Freq: Once | ORAL | Status: AC
  Administered 2020-10-10: 20 mg via ORAL
  Filled 2020-10-10: qty 1

## 2020-10-10 MED ORDER — HYDROXYZINE HCL 25 MG PO TABS: 25.0000 mg | ORAL_TABLET | Freq: Four times a day (QID) | ORAL | 0 refills | Status: DC

## 2020-10-10 MED ORDER — HYDROXYZINE HCL 25 MG PO TABS
25.0000 mg | ORAL_TABLET | Freq: Once | ORAL | Status: AC
  Administered 2020-10-10: 25 mg via ORAL
  Filled 2020-10-10: qty 1

## 2020-10-10 MED ORDER — CEPHALEXIN 250 MG PO CAPS
500.0000 mg | ORAL_CAPSULE | Freq: Once | ORAL | Status: AC
  Administered 2020-10-10: 500 mg via ORAL
  Filled 2020-10-10: qty 2

## 2020-10-10 NOTE — ED Provider Notes (Signed)
[facility] EMERGENCY DEPARTMENT Provider Note   CSN: [id number] Arrival date & time: 10/09/20  1750     History Chief Complaint  Patient presents with  . Shortness of Breath    Jordan [name] is a 55 y.o. male.  Patient is a 55 year old male with history of end-stage renal disease on hemodialysis, hypertension, anemia.  Patient is brought by EMS for evaluation of shortness of breath.  Patient last dialyzed 4 days ago, and missed yesterday due to the weather.  He denies chest pain, fever, or productive cough.  He tells me he has been unable to complete his dialysis sessions recently due to a rash that is on his buttocks and the back of his legs.  He is requesting admission so that he can be dialyzed lying down instead of sitting.   The history is provided by the patient.  Shortness of Breath Severity:  Moderate Onset quality:  Gradual Timing:  Constant Progression:  Worsening Context: activity   Relieved by:  Nothing Worsened by:  Exertion Ineffective treatments:  None tried      Past Medical History:  Diagnosis Date  . Anemia   . Aortic atherosclerosis ([place]) 11/12/2019  . CKD (chronic kidney disease)    Stage 5  Dialysis - M/W/F in [place], [place]  . Dyspnea    tx with inhaler when sick  . ED (erectile dysfunction)   . Emphysema of lung ([facility]) 11/12/2019  . ETOH abuse   . History of blood transfusion   . Hypertension   . Wears dentures     Patient Active Problem List   Diagnosis Date Noted  . Current mild episode of major depressive disorder without prior episode ([place]) 04/09/2020  . Atrial fibrillation with RVR ([place]) 04/04/2020  . 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 Aortic atherosclerosis ([place]) 11/12/2019  . Emphysema of lung ([place]) 11/12/2019  . Anemia in ESRD (end-stage renal disease) ([place]) 05/17/2018  . ESRD on dialysis ([place]) 05/26/2017  . Moderate protein-calorie malnutrition ([facility]) 08/13/2016  .  Secondary hyperparathyroidism of renal origin ([place]) 08/13/2016    Past Surgical History:  Procedure Laterality Date  . BASCILIC VEIN TRANSPOSITION Left 08/07/2016   Procedure: LEFT BASILIC VEIN TRANSPOSITION;  Surgeon: Angelia Mould, MD;  Location: [facility];  Service: Vascular;  Laterality: Left;  . CLOSED REDUCTION NASAL FRACTURE N/A 08/11/2019   Procedure: CLOSED REDUCTION NASAL FRACTURE WITH STABILIZATION;  Surgeon: Irene Limbo, MD;  Location: [facility];  Service: Plastics;  Laterality: N/A;  . COLONOSCOPY N/A 08/12/2016   Procedure: COLONOSCOPY;  Surgeon: Otis Brace, MD;  Location: [facility];  Service: Gastroenterology;  Laterality: N/A;  . ESOPHAGOGASTRODUODENOSCOPY N/A 08/12/2016   Procedure: ESOPHAGOGASTRODUODENOSCOPY (EGD);  Surgeon: Otis Brace, MD;  Location: [facility];  Service: Gastroenterology;  Laterality: N/A;  . INSERTION OF DIALYSIS CATHETER N/A 08/07/2016   Procedure: INSERTION OF TUNNELED DIALYSIS CATHETER;  Surgeon: Angelia Mould, MD;  Location: [facility];  Service: Vascular;  Laterality: N/A;  . LIGATION OF ARTERIOVENOUS  FISTULA Left 09/12/2016   Procedure: BANDING OF LEFT  ARTERIOVENOUS  FISTULA;  Surgeon: Angelia Mould, MD;  Location: [facility];  Service: Vascular;  Laterality: Left;  . LOWER EXTREMITY INTERVENTION Right 12/02/2018   Procedure: LOWER EXTREMITY INTERVENTION;  Surgeon: Algernon Huxley, MD;  Location: [facility];  Service: Cardiovascular;  Laterality: Right;       Family History  Problem Relation Age of Onset  . Diabetes Mother   .  Kidney failure Mother   . Healthy Father   . Kidney failure Brother   . Healthy Sister   . Kidney disease Daughter   . Post-traumatic stress disorder Neg Hx   . Bladder Cancer Neg Hx   . Kidney cancer Neg Hx     Social History   Tobacco Use  . Smoking status: Current Every Day Smoker    Packs/day: 0.50    Years: 40.00    Pack years: 20.00    Types: Cigarettes  .  Smokeless tobacco: Never Used  Vaping Use  . Vaping Use: Never used  Substance Use Topics  . Alcohol use: Yes    Alcohol/week: 21.0 standard drinks    Types: 21 Cans of beer per week    Comment: last drink 6 months ago  . Drug use: No    Home Medications Prior to Admission medications   Medication Sig Start Date End Date Taking? Authorizing Provider  calcium acetate (PHOSLO) 667 MG capsule Take 2 capsules (1,334 mg total) by mouth 3 (three) times daily with meals. 02/12/19   Fritzi Mandes, MD  epoetin alfa (EPOGEN) 4000 UNIT/ML injection Inject 1 mL (4,000 Units total) into the vein every Monday, Wednesday, and Friday with hemodialysis. 06/22/20   Enzo Bi, MD  gabapentin (NEURONTIN) 100 MG capsule Take 1 capsule (100 mg total) by mouth at bedtime. 08/21/20   Jearld Fenton, NP  hydrOXYzine (ATARAX/VISTARIL) 50 MG tablet Take 1 tablet (50 mg total) by mouth 3 (three) times daily as needed. 06/04/20   Blake Divine, MD  lisinopril (ZESTRIL) 10 MG tablet Take 1 tablet (10 mg total) by mouth daily. Blood pressure medication. 06/20/20 09/18/20  Enzo Bi, MD  OLANZapine (ZYPREXA) 2.5 MG tablet Take 1 tablet (2.5 mg total) by mouth at bedtime. 04/10/20   Candee Furbish, MD  sevelamer carbonate (RENVELA) 800 MG tablet Take 800 mg by mouth 3 (three) times daily with meals.    [provider]  thiamine 100 MG tablet Take 1 tablet (100 mg total) by mouth daily. 06/20/20   Enzo Bi, MD    Allergies    Patient has no known allergies.  Review of Systems   Review of Systems  Respiratory: Positive for shortness of breath.   All other systems reviewed and are negative.   Physical Exam Updated Vital Signs BP (!) 154/99 (BP Location: Right Arm)   Pulse (!) 110   Temp 97.7 F (36.5 C) (Oral)   Resp 18   Ht '5\' 4"'$  (1.626 m)   Wt 68 kg   SpO2 97%   BMI 25.75 kg/m   Physical Exam Vitals and nursing note reviewed.  Constitutional:      General: He is not in acute distress.     Appearance: He is well-developed and well-nourished. He is not diaphoretic.  HENT:     Head: Normocephalic and atraumatic.     Mouth/Throat:     Mouth: Oropharynx is clear and moist.  Cardiovascular:     Rate and Rhythm: Normal rate and regular rhythm.     Heart sounds: No murmur heard. No friction rub.  Pulmonary:     Effort: Pulmonary effort is normal. No respiratory distress.     Breath sounds: Normal breath sounds. No wheezing or rales.  Abdominal:     General: Bowel sounds are normal. There is no distension.     Palpations: Abdomen is soft.     Tenderness: There is no abdominal tenderness.  Musculoskeletal:  General: No edema. Normal range of motion.     Cervical back: Normal range of motion and neck supple.     Right lower leg: No edema.     Left lower leg: No edema.     Comments: There is a minimally visible, punctate, scattered macular rash to the backs of the legs and buttocks.    Skin:    General: Skin is warm and dry.  Neurological:     Mental Status: He is alert and oriented to person, place, and time.     Coordination: Coordination normal.     ED Results / Procedures / Treatments   Labs (all labs ordered are listed, but only abnormal results are displayed) Labs Reviewed  BASIC METABOLIC PANEL - Abnormal; Notable for the following components:      Result Value   CO2 21 (*)    Glucose, Bld 107 (*)    BUN 84 (*)    Creatinine, Ser 17.70 (*)    GFR, Estimated 3 (*)    Anion gap 21 (*)    All other components within normal limits  CBC - Abnormal; Notable for the following components:   RBC 2.83 (*)    Hemoglobin 9.1 (*)    HCT 28.8 (*)    MCV 101.8 (*)    RDW 16.6 (*)    Platelets 131 (*)    All other components within normal limits    EKG EKG Interpretation  Date/Time:  Tuesday October 09 2020 19:15:01 EST Ventricular Rate:  105 PR Interval:  132 QRS Duration: 82 QT Interval:  358 QTC Calculation: 473 R Axis:   81 Text  Interpretation: Sinus tachycardia Cannot rule out Anterior infarct , age undetermined Abnormal ECG No significant change since 06/06/2020 Confirmed by Veryl Speak [phone]) on 10/10/2020 1:52:03 AM   Radiology DG Chest 2 View  Result Date: 10/09/2020 CLINICAL DATA:  Shortness of breath x1 day. EXAM: CHEST - 2 VIEW COMPARISON:  June 19, 2020 FINDINGS: Mild, diffusely increased interstitial lung markings are seen. There is no evidence of a pleural effusion or pneumothorax. Stable mild to moderate severity enlargement of the cardiac silhouette is seen. There is moderate severity calcification of the aortic arch. The visualized skeletal structures are unremarkable. IMPRESSION: Stable cardiomegaly with mild interstitial edema. Electronically Signed   By: Virgina Norfolk M.D.   On: 10/09/2020 20:10    Procedures Procedures (including critical care time)  Medications Ordered in ED Medications  predniSONE (DELTASONE) tablet 20 mg (has no administration in time range)  cephALEXin (KEFLEX) capsule 500 mg (has no administration in time range)  hydrOXYzine (ATARAX/VISTARIL) tablet 25 mg (has no administration in time range)    ED Course  I have reviewed the triage vital signs and the nursing notes.  Pertinent labs & imaging results that were available during my care of the patient were reviewed by me and considered in my medical decision making (see chart for details).    MDM Rules/Calculators/A&P  Patient presenting here with complaints of shortness of breath related to volume overload secondary to noncompliance with his dialysis.  Patient tells me he is unable to sit for his dialysis sessions due to a rash that is on the back of his legs and buttock.  I have examined this rash.  There is no evidence for abscess, cellulitis, or other significant finding.  There is a very mild rash as described in the HPI.  I am uncertain as to the etiology of this rash, but will treat  with prednisone,  antibiotics, and hydroxyzine.  His oxygen saturations are 92 to 93% on room air and he is in no respiratory distress.  Laboratory studies show no emergent reason for dialysis.  He is not acidotic or hyperkalemic.  Chest x-ray does show mild edema.  I see no indication for emergent dialysis and feel as though he can make his dialysis session tomorrow as scheduled.  At this point, patient will be discharged with the above medications and instructions to maintain compliance with dialysis.  Final Clinical Impression(s) / ED Diagnoses Final diagnoses:  None    Rx / DC Orders ED Discharge Orders    None       Veryl Speak, MD 10/10/20 0155

## 2020-10-10 NOTE — Discharge Instructions (Signed)
Go to your dialysis session tomorrow as previously scheduled.  Begin taking Keflex, hydroxyzine, and prednisone as prescribed today.

## 2020-10-10 NOTE — ED Notes (Signed)
Called [facility] '@02'$ :09

## 2020-10-12 DIAGNOSIS — N186 End stage renal disease: Secondary | ICD-10-CM | POA: Diagnosis not present

## 2020-10-12 DIAGNOSIS — Z992 Dependence on renal dialysis: Secondary | ICD-10-CM | POA: Diagnosis not present

## 2020-10-12 DIAGNOSIS — L299 Pruritus, unspecified: Secondary | ICD-10-CM | POA: Diagnosis not present

## 2020-10-12 DIAGNOSIS — N2581 Secondary hyperparathyroidism of renal origin: Secondary | ICD-10-CM | POA: Diagnosis not present

## 2020-10-12 DIAGNOSIS — R52 Pain, unspecified: Secondary | ICD-10-CM | POA: Diagnosis not present

## 2020-10-12 DIAGNOSIS — D631 Anemia in chronic kidney disease: Secondary | ICD-10-CM | POA: Diagnosis not present

## 2020-10-12 DIAGNOSIS — D509 Iron deficiency anemia, unspecified: Secondary | ICD-10-CM | POA: Diagnosis not present

## 2020-10-15 DIAGNOSIS — D631 Anemia in chronic kidney disease: Secondary | ICD-10-CM | POA: Diagnosis not present

## 2020-10-15 DIAGNOSIS — R52 Pain, unspecified: Secondary | ICD-10-CM | POA: Diagnosis not present

## 2020-10-15 DIAGNOSIS — Z992 Dependence on renal dialysis: Secondary | ICD-10-CM | POA: Diagnosis not present

## 2020-10-15 DIAGNOSIS — D509 Iron deficiency anemia, unspecified: Secondary | ICD-10-CM | POA: Diagnosis not present

## 2020-10-15 DIAGNOSIS — L299 Pruritus, unspecified: Secondary | ICD-10-CM | POA: Diagnosis not present

## 2020-10-15 DIAGNOSIS — N186 End stage renal disease: Secondary | ICD-10-CM | POA: Diagnosis not present

## 2020-10-15 DIAGNOSIS — N2581 Secondary hyperparathyroidism of renal origin: Secondary | ICD-10-CM | POA: Diagnosis not present

## 2020-10-17 DIAGNOSIS — R52 Pain, unspecified: Secondary | ICD-10-CM | POA: Diagnosis not present

## 2020-10-17 DIAGNOSIS — Z992 Dependence on renal dialysis: Secondary | ICD-10-CM | POA: Diagnosis not present

## 2020-10-17 DIAGNOSIS — D509 Iron deficiency anemia, unspecified: Secondary | ICD-10-CM | POA: Diagnosis not present

## 2020-10-17 DIAGNOSIS — N2581 Secondary hyperparathyroidism of renal origin: Secondary | ICD-10-CM | POA: Diagnosis not present

## 2020-10-17 DIAGNOSIS — D631 Anemia in chronic kidney disease: Secondary | ICD-10-CM | POA: Diagnosis not present

## 2020-10-17 DIAGNOSIS — L299 Pruritus, unspecified: Secondary | ICD-10-CM | POA: Diagnosis not present

## 2020-10-17 DIAGNOSIS — N186 End stage renal disease: Secondary | ICD-10-CM | POA: Diagnosis not present

## 2020-10-19 DIAGNOSIS — Z992 Dependence on renal dialysis: Secondary | ICD-10-CM | POA: Diagnosis not present

## 2020-10-19 DIAGNOSIS — R52 Pain, unspecified: Secondary | ICD-10-CM | POA: Diagnosis not present

## 2020-10-19 DIAGNOSIS — N2581 Secondary hyperparathyroidism of renal origin: Secondary | ICD-10-CM | POA: Diagnosis not present

## 2020-10-19 DIAGNOSIS — N186 End stage renal disease: Secondary | ICD-10-CM | POA: Diagnosis not present

## 2020-10-19 DIAGNOSIS — L299 Pruritus, unspecified: Secondary | ICD-10-CM | POA: Diagnosis not present

## 2020-10-19 DIAGNOSIS — D631 Anemia in chronic kidney disease: Secondary | ICD-10-CM | POA: Diagnosis not present

## 2020-10-19 DIAGNOSIS — D509 Iron deficiency anemia, unspecified: Secondary | ICD-10-CM | POA: Diagnosis not present

## 2020-10-22 DIAGNOSIS — D631 Anemia in chronic kidney disease: Secondary | ICD-10-CM | POA: Diagnosis not present

## 2020-10-22 DIAGNOSIS — R52 Pain, unspecified: Secondary | ICD-10-CM | POA: Diagnosis not present

## 2020-10-22 DIAGNOSIS — Z992 Dependence on renal dialysis: Secondary | ICD-10-CM | POA: Diagnosis not present

## 2020-10-22 DIAGNOSIS — I129 Hypertensive chronic kidney disease with stage 1 through stage 4 chronic kidney disease, or unspecified chronic kidney disease: Secondary | ICD-10-CM | POA: Diagnosis not present

## 2020-10-22 DIAGNOSIS — D509 Iron deficiency anemia, unspecified: Secondary | ICD-10-CM | POA: Diagnosis not present

## 2020-10-22 DIAGNOSIS — N186 End stage renal disease: Secondary | ICD-10-CM | POA: Diagnosis not present

## 2020-10-22 DIAGNOSIS — N2581 Secondary hyperparathyroidism of renal origin: Secondary | ICD-10-CM | POA: Diagnosis not present

## 2020-10-22 DIAGNOSIS — L299 Pruritus, unspecified: Secondary | ICD-10-CM | POA: Diagnosis not present

## 2020-10-23 DIAGNOSIS — I509 Heart failure, unspecified: Secondary | ICD-10-CM | POA: Diagnosis not present

## 2020-10-24 DIAGNOSIS — R52 Pain, unspecified: Secondary | ICD-10-CM | POA: Diagnosis not present

## 2020-10-24 DIAGNOSIS — R519 Headache, unspecified: Secondary | ICD-10-CM | POA: Diagnosis not present

## 2020-10-24 DIAGNOSIS — D509 Iron deficiency anemia, unspecified: Secondary | ICD-10-CM | POA: Diagnosis not present

## 2020-10-24 DIAGNOSIS — E8770 Fluid overload, unspecified: Secondary | ICD-10-CM | POA: Diagnosis not present

## 2020-10-24 DIAGNOSIS — N186 End stage renal disease: Secondary | ICD-10-CM | POA: Diagnosis not present

## 2020-10-24 DIAGNOSIS — Z992 Dependence on renal dialysis: Secondary | ICD-10-CM | POA: Diagnosis not present

## 2020-10-24 DIAGNOSIS — L299 Pruritus, unspecified: Secondary | ICD-10-CM | POA: Diagnosis not present

## 2020-10-24 DIAGNOSIS — D631 Anemia in chronic kidney disease: Secondary | ICD-10-CM | POA: Diagnosis not present

## 2020-10-24 DIAGNOSIS — N2581 Secondary hyperparathyroidism of renal origin: Secondary | ICD-10-CM | POA: Diagnosis not present

## 2020-10-26 DIAGNOSIS — E8770 Fluid overload, unspecified: Secondary | ICD-10-CM | POA: Diagnosis not present

## 2020-10-26 DIAGNOSIS — N2581 Secondary hyperparathyroidism of renal origin: Secondary | ICD-10-CM | POA: Diagnosis not present

## 2020-10-26 DIAGNOSIS — R519 Headache, unspecified: Secondary | ICD-10-CM | POA: Diagnosis not present

## 2020-10-26 DIAGNOSIS — D509 Iron deficiency anemia, unspecified: Secondary | ICD-10-CM | POA: Diagnosis not present

## 2020-10-26 DIAGNOSIS — R52 Pain, unspecified: Secondary | ICD-10-CM | POA: Diagnosis not present

## 2020-10-26 DIAGNOSIS — D631 Anemia in chronic kidney disease: Secondary | ICD-10-CM | POA: Diagnosis not present

## 2020-10-26 DIAGNOSIS — Z992 Dependence on renal dialysis: Secondary | ICD-10-CM | POA: Diagnosis not present

## 2020-10-26 DIAGNOSIS — N186 End stage renal disease: Secondary | ICD-10-CM | POA: Diagnosis not present

## 2020-10-26 DIAGNOSIS — L299 Pruritus, unspecified: Secondary | ICD-10-CM | POA: Diagnosis not present

## 2020-10-31 DIAGNOSIS — L299 Pruritus, unspecified: Secondary | ICD-10-CM | POA: Diagnosis not present

## 2020-10-31 DIAGNOSIS — R519 Headache, unspecified: Secondary | ICD-10-CM | POA: Diagnosis not present

## 2020-10-31 DIAGNOSIS — E8770 Fluid overload, unspecified: Secondary | ICD-10-CM | POA: Diagnosis not present

## 2020-10-31 DIAGNOSIS — R52 Pain, unspecified: Secondary | ICD-10-CM | POA: Diagnosis not present

## 2020-10-31 DIAGNOSIS — Z992 Dependence on renal dialysis: Secondary | ICD-10-CM | POA: Diagnosis not present

## 2020-10-31 DIAGNOSIS — D509 Iron deficiency anemia, unspecified: Secondary | ICD-10-CM | POA: Diagnosis not present

## 2020-10-31 DIAGNOSIS — N2581 Secondary hyperparathyroidism of renal origin: Secondary | ICD-10-CM | POA: Diagnosis not present

## 2020-10-31 DIAGNOSIS — D631 Anemia in chronic kidney disease: Secondary | ICD-10-CM | POA: Diagnosis not present

## 2020-10-31 DIAGNOSIS — N186 End stage renal disease: Secondary | ICD-10-CM | POA: Diagnosis not present

## 2020-11-02 DIAGNOSIS — N2581 Secondary hyperparathyroidism of renal origin: Secondary | ICD-10-CM | POA: Diagnosis not present

## 2020-11-02 DIAGNOSIS — D509 Iron deficiency anemia, unspecified: Secondary | ICD-10-CM | POA: Diagnosis not present

## 2020-11-02 DIAGNOSIS — R519 Headache, unspecified: Secondary | ICD-10-CM | POA: Diagnosis not present

## 2020-11-02 DIAGNOSIS — L299 Pruritus, unspecified: Secondary | ICD-10-CM | POA: Diagnosis not present

## 2020-11-02 DIAGNOSIS — E8770 Fluid overload, unspecified: Secondary | ICD-10-CM | POA: Diagnosis not present

## 2020-11-02 DIAGNOSIS — D631 Anemia in chronic kidney disease: Secondary | ICD-10-CM | POA: Diagnosis not present

## 2020-11-02 DIAGNOSIS — Z992 Dependence on renal dialysis: Secondary | ICD-10-CM | POA: Diagnosis not present

## 2020-11-02 DIAGNOSIS — R52 Pain, unspecified: Secondary | ICD-10-CM | POA: Diagnosis not present

## 2020-11-02 DIAGNOSIS — N186 End stage renal disease: Secondary | ICD-10-CM | POA: Diagnosis not present

## 2020-11-05 DIAGNOSIS — R52 Pain, unspecified: Secondary | ICD-10-CM | POA: Diagnosis not present

## 2020-11-05 DIAGNOSIS — D509 Iron deficiency anemia, unspecified: Secondary | ICD-10-CM | POA: Diagnosis not present

## 2020-11-05 DIAGNOSIS — E8770 Fluid overload, unspecified: Secondary | ICD-10-CM | POA: Diagnosis not present

## 2020-11-05 DIAGNOSIS — Z992 Dependence on renal dialysis: Secondary | ICD-10-CM | POA: Diagnosis not present

## 2020-11-05 DIAGNOSIS — R519 Headache, unspecified: Secondary | ICD-10-CM | POA: Diagnosis not present

## 2020-11-05 DIAGNOSIS — D631 Anemia in chronic kidney disease: Secondary | ICD-10-CM | POA: Diagnosis not present

## 2020-11-05 DIAGNOSIS — N2581 Secondary hyperparathyroidism of renal origin: Secondary | ICD-10-CM | POA: Diagnosis not present

## 2020-11-05 DIAGNOSIS — N186 End stage renal disease: Secondary | ICD-10-CM | POA: Diagnosis not present

## 2020-11-05 DIAGNOSIS — L299 Pruritus, unspecified: Secondary | ICD-10-CM | POA: Diagnosis not present

## 2020-11-06 ENCOUNTER — Ambulatory Visit: Payer: Medicare Other | Admitting: Internal Medicine

## 2020-11-06 ENCOUNTER — Telehealth: Payer: Self-pay

## 2020-11-06 NOTE — Telephone Encounter (Signed)
Pt no showed appt today and did not call to cancel or reschedule... per letter mailed 06/19/2020... Written notice that if pt was to No Show 1 more appointment- it would be grounds for termination from the practice

## 2020-11-06 NOTE — Telephone Encounter (Signed)
Needs to be dismissed due to multiple no shows

## 2020-11-06 NOTE — Progress Notes (Deleted)
Subjective:    Patient ID: Jordan [name], male    DOB: [birth year], 55 y.o.   MRN: [id number]  HPI    Review of Systems  Past Medical History:  Diagnosis Date  . Anemia   . Aortic atherosclerosis ([facility]) 11/12/2019  . CKD (chronic kidney disease)    Stage 5  Dialysis - M/W/F in [place], [place]  . Dyspnea    tx with inhaler when sick  . ED (erectile dysfunction)   . Emphysema of lung ([facility] [place]) 11/12/2019  . ETOH abuse   . History of blood transfusion   . Hypertension   . Wears dentures     Current Outpatient Medications  Medication Sig Dispense Refill  . calcium acetate (PHOSLO) 667 MG capsule Take 2 capsules (1,334 mg total) by mouth 3 (three) times daily with meals. 90 capsule 2  . cephALEXin (KEFLEX) 500 MG capsule Take 1 capsule (500 mg total) by mouth 2 (two) times daily. 28 capsule 0  . epoetin alfa (EPOGEN) 4000 UNIT/ML injection Inject 1 mL (4,000 Units total) into the vein every Monday, Wednesday, and Friday with hemodialysis. 1 mL   . gabapentin (NEURONTIN) 100 MG capsule Take 1 capsule (100 mg total) by mouth at bedtime. 30 capsule 0  . hydrOXYzine (ATARAX/VISTARIL) 25 MG tablet Take 1 tablet (25 mg total) by mouth every 6 (six) hours. 12 tablet 0  . lisinopril (ZESTRIL) 10 MG tablet Take 1 tablet (10 mg total) by mouth daily. Blood pressure medication. 30 tablet 2  . OLANZapine (ZYPREXA) 2.5 MG tablet Take 1 tablet (2.5 mg total) by mouth at bedtime. 30 tablet 1  . predniSONE (DELTASONE) 10 MG tablet Take 2 tablets (20 mg total) by mouth 2 (two) times daily with a meal. 20 tablet 0  . sevelamer carbonate (RENVELA) 800 MG tablet Take 800 mg by mouth 3 (three) times daily with meals.    . thiamine 100 MG tablet Take 1 tablet (100 mg total) by mouth daily.     No current facility-administered medications for this visit.    No Known Allergies  Family History  Problem Relation Age of Onset  . Diabetes Mother   . Kidney failure Mother   . Healthy Father   . Kidney  failure Brother   . Healthy Sister   . Kidney disease Daughter   . Post-traumatic stress disorder Neg Hx   . Bladder Cancer Neg Hx   . Kidney cancer Neg Hx     Social History   Socioeconomic History  . Marital status: Single    Spouse name: Not on file  . Number of children: Not on file  . Years of education: Not on file  . Highest education level: Not on file  Occupational History  . Not on file  Tobacco Use  . Smoking status: Current Every Day Smoker    Packs/day: 0.50    Years: 40.00    Pack years: 20.00    Types: Cigarettes  . Smokeless tobacco: Never Used  Vaping Use  . Vaping Use: Never used  Substance and Sexual Activity  . Alcohol use: Yes    Alcohol/week: 21.0 standard drinks    Types: 21 Cans of beer per week    Comment: last drink 6 months ago  . Drug use: No  . Sexual activity: Not Currently  Other Topics Concern  . Not on file  Social History Narrative  . Not on file   Social Determinants of Health   Financial Resource Strain: Not  on file  Food Insecurity: Not on file  Transportation Needs: Not on file  Physical Activity: Not on file  Stress: Not on file  Social Connections: Not on file  Intimate Partner Violence: Not on file     Constitutional: Denies fever, malaise, fatigue, headache or abrupt weight changes.  HEENT: Denies eye pain, eye redness, ear pain, ringing in the ears, wax buildup, runny nose, nasal congestion, bloody nose, or sore throat. Respiratory: Denies difficulty breathing, shortness of breath, cough or sputum production.   Cardiovascular: Denies chest pain, chest tightness, palpitations or swelling in the hands or feet.  Gastrointestinal: Denies abdominal pain, bloating, constipation, diarrhea or blood in the stool.  GU: Denies urgency, frequency, pain with urination, burning sensation, blood in urine, odor or discharge. Musculoskeletal: Denies decrease in range of motion, difficulty with gait, muscle pain or joint pain and  swelling.  Skin: Denies redness, rashes, lesions or ulcercations.  Neurological: Denies dizziness, difficulty with memory, difficulty with speech or problems with balance and coordination.  Psych: Denies anxiety, depression, SI/HI.  No other specific complaints in a complete review of systems (except as listed in HPI above).     Objective:   Physical Exam   There were no vitals taken for this visit. Wt Readings from Last 3 Encounters:  10/09/20 150 lb (68 kg)  08/21/20 148 lb (67.1 kg)  06/20/20 160 lb 0.9 oz (72.6 kg)    General: Appears their stated age, well developed, well nourished in NAD. Skin: Warm, dry and intact. No rashes, lesions or ulcerations noted. HEENT: Head: normal shape and size; Eyes: sclera white, no icterus, conjunctiva pink, PERRLA and EOMs intact; Ears: Tm's gray and intact, normal light reflex; Nose: mucosa pink and moist, septum midline; Throat/Mouth: Teeth present, mucosa pink and moist, no exudate, lesions or ulcerations noted.  Neck:  Neck supple, trachea midline. No masses, lumps or thyromegaly present.  Cardiovascular: Normal rate and rhythm. S1,S2 noted.  No murmur, rubs or gallops noted. No JVD or BLE edema. No carotid bruits noted. Pulmonary/Chest: Normal effort and positive vesicular breath sounds. No respiratory distress. No wheezes, rales or ronchi noted.  Abdomen: Soft and nontender. Normal bowel sounds. No distention or masses noted. Liver, spleen and kidneys non palpable. Musculoskeletal: Normal range of motion. No signs of joint swelling. No difficulty with gait.  Neurological: Alert and oriented. Cranial nerves II-XII grossly intact. Coordination normal.  Psychiatric: Mood and affect normal. Behavior is normal. Judgment and thought content normal.   EKG:  BMET    Component Value Date/Time   NA 142 10/09/2020 1915   K 5.1 10/09/2020 1915   CL 100 10/09/2020 1915   CO2 21 (L) 10/09/2020 1915   GLUCOSE 107 (H) 10/09/2020 1915   BUN 84 (H)  10/09/2020 1915   CREATININE 17.70 (H) 10/09/2020 1915   CALCIUM 9.9 10/09/2020 1915   GFRNONAA 3 (L) 10/09/2020 1915   GFRAA 4 (L) 06/20/2020 0414    Lipid Panel     Component Value Date/Time   CHOL 145 05/17/2018 0359   TRIG 67 05/17/2018 0359   HDL 34 (L) 05/17/2018 0359   CHOLHDL 4.3 05/17/2018 0359   VLDL 13 05/17/2018 0359   LDLCALC 98 05/17/2018 0359    CBC    Component Value Date/Time   WBC 7.0 10/09/2020 1915   RBC 2.83 (L) 10/09/2020 1915   HGB 9.1 (L) 10/09/2020 1915   HCT 28.8 (L) 10/09/2020 1915   PLT 131 (L) 10/09/2020 1915   MCV 101.8 (  H) 10/09/2020 1915   MCH 32.2 10/09/2020 1915   MCHC 31.6 10/09/2020 1915   RDW 16.6 (H) 10/09/2020 1915   LYMPHSABS 0.7 06/06/2020 1200   MONOABS 0.5 06/06/2020 1200   EOSABS 0.2 06/06/2020 1200   BASOSABS 0.0 06/06/2020 1200    Hgb A1C Lab Results  Component Value Date   HGBA1C 5.0 04/07/2020           Assessment & Plan:     Webb Silversmith, NP This visit occurred during the SARS-CoV-2 public health emergency.  Safety protocols were in place, including screening questions prior to the visit, additional usage of staff PPE, and extensive cleaning of exam room while observing appropriate contact time as indicated for disinfecting solutions.

## 2020-11-09 DIAGNOSIS — L299 Pruritus, unspecified: Secondary | ICD-10-CM | POA: Diagnosis not present

## 2020-11-09 DIAGNOSIS — N2581 Secondary hyperparathyroidism of renal origin: Secondary | ICD-10-CM | POA: Diagnosis not present

## 2020-11-09 DIAGNOSIS — R519 Headache, unspecified: Secondary | ICD-10-CM | POA: Diagnosis not present

## 2020-11-09 DIAGNOSIS — Z992 Dependence on renal dialysis: Secondary | ICD-10-CM | POA: Diagnosis not present

## 2020-11-09 DIAGNOSIS — D509 Iron deficiency anemia, unspecified: Secondary | ICD-10-CM | POA: Diagnosis not present

## 2020-11-09 DIAGNOSIS — N186 End stage renal disease: Secondary | ICD-10-CM | POA: Diagnosis not present

## 2020-11-09 DIAGNOSIS — R52 Pain, unspecified: Secondary | ICD-10-CM | POA: Diagnosis not present

## 2020-11-09 DIAGNOSIS — E8770 Fluid overload, unspecified: Secondary | ICD-10-CM | POA: Diagnosis not present

## 2020-11-09 DIAGNOSIS — D631 Anemia in chronic kidney disease: Secondary | ICD-10-CM | POA: Diagnosis not present

## 2020-11-10 DIAGNOSIS — D509 Iron deficiency anemia, unspecified: Secondary | ICD-10-CM | POA: Diagnosis not present

## 2020-11-10 DIAGNOSIS — N186 End stage renal disease: Secondary | ICD-10-CM | POA: Diagnosis not present

## 2020-11-10 DIAGNOSIS — Z992 Dependence on renal dialysis: Secondary | ICD-10-CM | POA: Diagnosis not present

## 2020-11-10 DIAGNOSIS — R519 Headache, unspecified: Secondary | ICD-10-CM | POA: Diagnosis not present

## 2020-11-10 DIAGNOSIS — D631 Anemia in chronic kidney disease: Secondary | ICD-10-CM | POA: Diagnosis not present

## 2020-11-10 DIAGNOSIS — R52 Pain, unspecified: Secondary | ICD-10-CM | POA: Diagnosis not present

## 2020-11-10 DIAGNOSIS — N2581 Secondary hyperparathyroidism of renal origin: Secondary | ICD-10-CM | POA: Diagnosis not present

## 2020-11-10 DIAGNOSIS — E8770 Fluid overload, unspecified: Secondary | ICD-10-CM | POA: Diagnosis not present

## 2020-11-10 DIAGNOSIS — L299 Pruritus, unspecified: Secondary | ICD-10-CM | POA: Diagnosis not present

## 2020-11-14 DIAGNOSIS — R519 Headache, unspecified: Secondary | ICD-10-CM | POA: Diagnosis not present

## 2020-11-14 DIAGNOSIS — Z992 Dependence on renal dialysis: Secondary | ICD-10-CM | POA: Diagnosis not present

## 2020-11-14 DIAGNOSIS — E8770 Fluid overload, unspecified: Secondary | ICD-10-CM | POA: Diagnosis not present

## 2020-11-14 DIAGNOSIS — D509 Iron deficiency anemia, unspecified: Secondary | ICD-10-CM | POA: Diagnosis not present

## 2020-11-14 DIAGNOSIS — N2581 Secondary hyperparathyroidism of renal origin: Secondary | ICD-10-CM | POA: Diagnosis not present

## 2020-11-14 DIAGNOSIS — R52 Pain, unspecified: Secondary | ICD-10-CM | POA: Diagnosis not present

## 2020-11-14 DIAGNOSIS — N186 End stage renal disease: Secondary | ICD-10-CM | POA: Diagnosis not present

## 2020-11-14 DIAGNOSIS — L299 Pruritus, unspecified: Secondary | ICD-10-CM | POA: Diagnosis not present

## 2020-11-14 DIAGNOSIS — D631 Anemia in chronic kidney disease: Secondary | ICD-10-CM | POA: Diagnosis not present

## 2020-11-16 DIAGNOSIS — Z992 Dependence on renal dialysis: Secondary | ICD-10-CM | POA: Diagnosis not present

## 2020-11-16 DIAGNOSIS — L299 Pruritus, unspecified: Secondary | ICD-10-CM | POA: Diagnosis not present

## 2020-11-16 DIAGNOSIS — N186 End stage renal disease: Secondary | ICD-10-CM | POA: Diagnosis not present

## 2020-11-16 DIAGNOSIS — N2581 Secondary hyperparathyroidism of renal origin: Secondary | ICD-10-CM | POA: Diagnosis not present

## 2020-11-16 DIAGNOSIS — E8770 Fluid overload, unspecified: Secondary | ICD-10-CM | POA: Diagnosis not present

## 2020-11-16 DIAGNOSIS — D631 Anemia in chronic kidney disease: Secondary | ICD-10-CM | POA: Diagnosis not present

## 2020-11-16 DIAGNOSIS — R519 Headache, unspecified: Secondary | ICD-10-CM | POA: Diagnosis not present

## 2020-11-16 DIAGNOSIS — R52 Pain, unspecified: Secondary | ICD-10-CM | POA: Diagnosis not present

## 2020-11-16 DIAGNOSIS — D509 Iron deficiency anemia, unspecified: Secondary | ICD-10-CM | POA: Diagnosis not present

## 2020-11-19 DIAGNOSIS — N2581 Secondary hyperparathyroidism of renal origin: Secondary | ICD-10-CM | POA: Diagnosis not present

## 2020-11-19 DIAGNOSIS — R519 Headache, unspecified: Secondary | ICD-10-CM | POA: Diagnosis not present

## 2020-11-19 DIAGNOSIS — Z992 Dependence on renal dialysis: Secondary | ICD-10-CM | POA: Diagnosis not present

## 2020-11-19 DIAGNOSIS — N186 End stage renal disease: Secondary | ICD-10-CM | POA: Diagnosis not present

## 2020-11-19 DIAGNOSIS — E8770 Fluid overload, unspecified: Secondary | ICD-10-CM | POA: Diagnosis not present

## 2020-11-19 DIAGNOSIS — R52 Pain, unspecified: Secondary | ICD-10-CM | POA: Diagnosis not present

## 2020-11-19 DIAGNOSIS — L299 Pruritus, unspecified: Secondary | ICD-10-CM | POA: Diagnosis not present

## 2020-11-19 DIAGNOSIS — D631 Anemia in chronic kidney disease: Secondary | ICD-10-CM | POA: Diagnosis not present

## 2020-11-19 DIAGNOSIS — I129 Hypertensive chronic kidney disease with stage 1 through stage 4 chronic kidney disease, or unspecified chronic kidney disease: Secondary | ICD-10-CM | POA: Diagnosis not present

## 2020-11-19 DIAGNOSIS — D509 Iron deficiency anemia, unspecified: Secondary | ICD-10-CM | POA: Diagnosis not present

## 2020-11-20 DIAGNOSIS — I509 Heart failure, unspecified: Secondary | ICD-10-CM | POA: Diagnosis not present

## 2020-11-20 NOTE — Telephone Encounter (Signed)
Shena with [facility] left v/m requesting cb or fax back to [phone] of certificate of medical necessity that was faxed to [facility]. Sending to [facility] CMA to see if Medical necessity form has already been faxed. Do not see under media tab.

## 2020-11-21 DIAGNOSIS — N186 End stage renal disease: Secondary | ICD-10-CM | POA: Diagnosis not present

## 2020-11-21 DIAGNOSIS — N2581 Secondary hyperparathyroidism of renal origin: Secondary | ICD-10-CM | POA: Diagnosis not present

## 2020-11-21 DIAGNOSIS — Z992 Dependence on renal dialysis: Secondary | ICD-10-CM | POA: Diagnosis not present

## 2020-11-21 DIAGNOSIS — D509 Iron deficiency anemia, unspecified: Secondary | ICD-10-CM | POA: Diagnosis not present

## 2020-11-26 DIAGNOSIS — L299 Pruritus, unspecified: Secondary | ICD-10-CM | POA: Diagnosis not present

## 2020-11-26 DIAGNOSIS — Z992 Dependence on renal dialysis: Secondary | ICD-10-CM | POA: Diagnosis not present

## 2020-11-26 DIAGNOSIS — R519 Headache, unspecified: Secondary | ICD-10-CM | POA: Diagnosis not present

## 2020-11-26 DIAGNOSIS — D509 Iron deficiency anemia, unspecified: Secondary | ICD-10-CM | POA: Diagnosis not present

## 2020-11-26 DIAGNOSIS — N2581 Secondary hyperparathyroidism of renal origin: Secondary | ICD-10-CM | POA: Diagnosis not present

## 2020-11-26 DIAGNOSIS — N186 End stage renal disease: Secondary | ICD-10-CM | POA: Diagnosis not present

## 2020-11-27 ENCOUNTER — Ambulatory Visit: Payer: Medicare Other | Admitting: Internal Medicine

## 2020-11-28 DIAGNOSIS — Z992 Dependence on renal dialysis: Secondary | ICD-10-CM | POA: Diagnosis not present

## 2020-11-28 DIAGNOSIS — N2581 Secondary hyperparathyroidism of renal origin: Secondary | ICD-10-CM | POA: Diagnosis not present

## 2020-11-28 DIAGNOSIS — R519 Headache, unspecified: Secondary | ICD-10-CM | POA: Diagnosis not present

## 2020-11-28 DIAGNOSIS — D509 Iron deficiency anemia, unspecified: Secondary | ICD-10-CM | POA: Diagnosis not present

## 2020-11-28 DIAGNOSIS — N186 End stage renal disease: Secondary | ICD-10-CM | POA: Diagnosis not present

## 2020-11-28 DIAGNOSIS — L299 Pruritus, unspecified: Secondary | ICD-10-CM | POA: Diagnosis not present

## 2020-11-28 NOTE — Telephone Encounter (Signed)
[facility] Night - Client Nonclinical Telephone Record AccessNurse Client [facility] Night - Client Client Site [place] Physician Webb Silversmith - NP Contact Type Call Who Is Calling Patient / Member / Family / Caregiver Caller Name Kathrynn Ducking Phone Number [phone] Patient Name Jordan [name] Patient DOB 1966-08-28 Call Type Message Only Information Provided Reason for Call Request for General Office Information Initial Comment Caller states she is from [facility] making a follow up on a medical certificate that was faxed on 11-20-2020. Additional Comment EXT [id number]. Her fax number is [phone]. She says she will wait on a response from NP [facility]. Disp. Time Disposition Final User 11/27/2020 5:10:11 PM General Information Provided Yes Oletta Lamas, Tamika Call Closed By: Noni Saupe Transaction Date/Time: 11/27/2020 5:04:37 PM (ET)

## 2020-11-28 NOTE — Telephone Encounter (Signed)
I have not seen anything with his name so far

## 2020-11-30 DIAGNOSIS — N2581 Secondary hyperparathyroidism of renal origin: Secondary | ICD-10-CM | POA: Diagnosis not present

## 2020-11-30 DIAGNOSIS — N186 End stage renal disease: Secondary | ICD-10-CM | POA: Diagnosis not present

## 2020-11-30 DIAGNOSIS — Z992 Dependence on renal dialysis: Secondary | ICD-10-CM | POA: Diagnosis not present

## 2020-11-30 DIAGNOSIS — R519 Headache, unspecified: Secondary | ICD-10-CM | POA: Diagnosis not present

## 2020-11-30 DIAGNOSIS — L299 Pruritus, unspecified: Secondary | ICD-10-CM | POA: Diagnosis not present

## 2020-11-30 DIAGNOSIS — D509 Iron deficiency anemia, unspecified: Secondary | ICD-10-CM | POA: Diagnosis not present

## 2020-12-05 DIAGNOSIS — Z992 Dependence on renal dialysis: Secondary | ICD-10-CM | POA: Diagnosis not present

## 2020-12-05 DIAGNOSIS — N2581 Secondary hyperparathyroidism of renal origin: Secondary | ICD-10-CM | POA: Diagnosis not present

## 2020-12-05 DIAGNOSIS — N186 End stage renal disease: Secondary | ICD-10-CM | POA: Diagnosis not present

## 2020-12-05 NOTE — Telephone Encounter (Signed)
I have not called or sent patient's dismissal letter yet but will reach take care of this on Friday.  In meantime I will defer to [facility] as to if they have forms and are filling out for patient given his no show pattern.

## 2020-12-05 NOTE — Telephone Encounter (Signed)
[facility] Night - Client Nonclinical Telephone Record AccessNurse Client [facility] Night - Client Client Site [place] Physician Webb Silversmith - NP Contact Type Call Who Is Calling Patient / Member / Family / Caregiver Caller Name Kathrynn Ducking Phone Number [phone] Patient Name Jordan [name] Patient DOB October 05, 1965 Call Type Message Only Information Provided Reason for Call Request for General Office Information Initial Comment Caller is calling from [place] she called March 8 and she faxed the certificate on March 1 and she is needing a call back from the MD regarding the documents. Additional Comment Caller is at ext. [phone] Disp. Time Disposition Final User 12/04/2020 5:48:30 PM General Information Provided Yes Irene, [place] Call Closed By: Madalyn Rob Transaction Date/Time: 12/04/2020 5:44:07 PM (ET)

## 2020-12-07 DIAGNOSIS — N186 End stage renal disease: Secondary | ICD-10-CM | POA: Diagnosis not present

## 2020-12-07 DIAGNOSIS — N2581 Secondary hyperparathyroidism of renal origin: Secondary | ICD-10-CM | POA: Diagnosis not present

## 2020-12-07 DIAGNOSIS — Z992 Dependence on renal dialysis: Secondary | ICD-10-CM | POA: Diagnosis not present

## 2020-12-10 DIAGNOSIS — Z992 Dependence on renal dialysis: Secondary | ICD-10-CM | POA: Diagnosis not present

## 2020-12-10 DIAGNOSIS — N2581 Secondary hyperparathyroidism of renal origin: Secondary | ICD-10-CM | POA: Diagnosis not present

## 2020-12-10 DIAGNOSIS — N186 End stage renal disease: Secondary | ICD-10-CM | POA: Diagnosis not present

## 2020-12-11 NOTE — Telephone Encounter (Signed)
Shena with [facility] [phone] stated that they sent over a medical necessity form and are requesting a response. I see that this patient is in the process of being dismissed from the practice. Please advise.

## 2020-12-12 DIAGNOSIS — N2581 Secondary hyperparathyroidism of renal origin: Secondary | ICD-10-CM | POA: Diagnosis not present

## 2020-12-12 DIAGNOSIS — Z992 Dependence on renal dialysis: Secondary | ICD-10-CM | POA: Diagnosis not present

## 2020-12-12 DIAGNOSIS — N186 End stage renal disease: Secondary | ICD-10-CM | POA: Diagnosis not present

## 2020-12-17 DIAGNOSIS — L299 Pruritus, unspecified: Secondary | ICD-10-CM | POA: Diagnosis not present

## 2020-12-17 DIAGNOSIS — N2581 Secondary hyperparathyroidism of renal origin: Secondary | ICD-10-CM | POA: Diagnosis not present

## 2020-12-17 DIAGNOSIS — N186 End stage renal disease: Secondary | ICD-10-CM | POA: Diagnosis not present

## 2020-12-17 DIAGNOSIS — Z992 Dependence on renal dialysis: Secondary | ICD-10-CM | POA: Diagnosis not present

## 2020-12-17 DIAGNOSIS — R52 Pain, unspecified: Secondary | ICD-10-CM | POA: Diagnosis not present

## 2020-12-19 DIAGNOSIS — R52 Pain, unspecified: Secondary | ICD-10-CM | POA: Diagnosis not present

## 2020-12-19 DIAGNOSIS — N2581 Secondary hyperparathyroidism of renal origin: Secondary | ICD-10-CM | POA: Diagnosis not present

## 2020-12-19 DIAGNOSIS — L299 Pruritus, unspecified: Secondary | ICD-10-CM | POA: Diagnosis not present

## 2020-12-19 DIAGNOSIS — Z992 Dependence on renal dialysis: Secondary | ICD-10-CM | POA: Diagnosis not present

## 2020-12-19 DIAGNOSIS — N186 End stage renal disease: Secondary | ICD-10-CM | POA: Diagnosis not present

## 2020-12-19 NOTE — Telephone Encounter (Signed)
Shena with [employer] left another v/m requesting status of certificate of med necessity. Shena request cb.

## 2020-12-20 DIAGNOSIS — N186 End stage renal disease: Secondary | ICD-10-CM | POA: Diagnosis not present

## 2020-12-20 DIAGNOSIS — Z992 Dependence on renal dialysis: Secondary | ICD-10-CM | POA: Diagnosis not present

## 2020-12-20 DIAGNOSIS — I129 Hypertensive chronic kidney disease with stage 1 through stage 4 chronic kidney disease, or unspecified chronic kidney disease: Secondary | ICD-10-CM | POA: Diagnosis not present

## 2020-12-21 DIAGNOSIS — I509 Heart failure, unspecified: Secondary | ICD-10-CM | POA: Diagnosis not present

## 2020-12-24 NOTE — Telephone Encounter (Addendum)
No Show Dismissal Letter completed today and placed in mail.  Will forward to CMA to address [employer] form request.

## 2020-12-26 DIAGNOSIS — Z992 Dependence on renal dialysis: Secondary | ICD-10-CM | POA: Diagnosis not present

## 2020-12-26 DIAGNOSIS — N186 End stage renal disease: Secondary | ICD-10-CM | POA: Diagnosis not present

## 2020-12-26 DIAGNOSIS — N2581 Secondary hyperparathyroidism of renal origin: Secondary | ICD-10-CM | POA: Diagnosis not present

## 2020-12-26 NOTE — Telephone Encounter (Signed)
I called [facility] and there was another provider they had listed for the form was going to, so not sure what was going on... They stated they would make a note

## 2020-12-28 DIAGNOSIS — Z992 Dependence on renal dialysis: Secondary | ICD-10-CM | POA: Diagnosis not present

## 2020-12-28 DIAGNOSIS — N2581 Secondary hyperparathyroidism of renal origin: Secondary | ICD-10-CM | POA: Diagnosis not present

## 2020-12-28 DIAGNOSIS — N186 End stage renal disease: Secondary | ICD-10-CM | POA: Diagnosis not present

## 2020-12-31 DIAGNOSIS — N2581 Secondary hyperparathyroidism of renal origin: Secondary | ICD-10-CM | POA: Diagnosis not present

## 2020-12-31 DIAGNOSIS — Z992 Dependence on renal dialysis: Secondary | ICD-10-CM | POA: Diagnosis not present

## 2020-12-31 DIAGNOSIS — N186 End stage renal disease: Secondary | ICD-10-CM | POA: Diagnosis not present

## 2021-01-04 DIAGNOSIS — Z992 Dependence on renal dialysis: Secondary | ICD-10-CM | POA: Diagnosis not present

## 2021-01-04 DIAGNOSIS — N186 End stage renal disease: Secondary | ICD-10-CM | POA: Diagnosis not present

## 2021-01-04 DIAGNOSIS — N2581 Secondary hyperparathyroidism of renal origin: Secondary | ICD-10-CM | POA: Diagnosis not present

## 2021-01-09 DIAGNOSIS — N186 End stage renal disease: Secondary | ICD-10-CM | POA: Diagnosis not present

## 2021-01-09 DIAGNOSIS — N2581 Secondary hyperparathyroidism of renal origin: Secondary | ICD-10-CM | POA: Diagnosis not present

## 2021-01-09 DIAGNOSIS — Z992 Dependence on renal dialysis: Secondary | ICD-10-CM | POA: Diagnosis not present

## 2021-01-10 DIAGNOSIS — Z992 Dependence on renal dialysis: Secondary | ICD-10-CM | POA: Diagnosis not present

## 2021-01-10 DIAGNOSIS — L299 Pruritus, unspecified: Secondary | ICD-10-CM | POA: Diagnosis not present

## 2021-01-10 DIAGNOSIS — L0291 Cutaneous abscess, unspecified: Secondary | ICD-10-CM | POA: Diagnosis not present

## 2021-01-10 DIAGNOSIS — N19 Unspecified kidney failure: Secondary | ICD-10-CM | POA: Diagnosis not present

## 2021-01-11 DIAGNOSIS — Z992 Dependence on renal dialysis: Secondary | ICD-10-CM | POA: Diagnosis not present

## 2021-01-11 DIAGNOSIS — N2581 Secondary hyperparathyroidism of renal origin: Secondary | ICD-10-CM | POA: Diagnosis not present

## 2021-01-11 DIAGNOSIS — N186 End stage renal disease: Secondary | ICD-10-CM | POA: Diagnosis not present

## 2021-01-12 ENCOUNTER — Inpatient Hospital Stay
Admission: EM | Admit: 2021-01-12 | Discharge: 2021-01-15 | DRG: 193 | Disposition: A | Discharge status: Home / Self Care | Payer: Medicare Other | Attending: Internal Medicine | Admitting: Internal Medicine

## 2021-01-12 ENCOUNTER — Other Ambulatory Visit: Payer: Self-pay

## 2021-01-12 DIAGNOSIS — F17210 Nicotine dependence, cigarettes, uncomplicated: Secondary | ICD-10-CM | POA: Diagnosis present

## 2021-01-12 DIAGNOSIS — R197 Diarrhea, unspecified: Secondary | ICD-10-CM | POA: Diagnosis present

## 2021-01-12 DIAGNOSIS — Z20822 Contact with and (suspected) exposure to covid-19: Secondary | ICD-10-CM | POA: Diagnosis not present

## 2021-01-12 DIAGNOSIS — R109 Unspecified abdominal pain: Secondary | ICD-10-CM | POA: Diagnosis not present

## 2021-01-12 DIAGNOSIS — I6529 Occlusion and stenosis of unspecified carotid artery: Secondary | ICD-10-CM | POA: Diagnosis not present

## 2021-01-12 DIAGNOSIS — R0602 Shortness of breath: Secondary | ICD-10-CM | POA: Diagnosis not present

## 2021-01-12 DIAGNOSIS — Z833 Family history of diabetes mellitus: Secondary | ICD-10-CM | POA: Diagnosis not present

## 2021-01-12 DIAGNOSIS — I5042 Chronic combined systolic (congestive) and diastolic (congestive) heart failure: Secondary | ICD-10-CM | POA: Diagnosis not present

## 2021-01-12 DIAGNOSIS — Z992 Dependence on renal dialysis: Secondary | ICD-10-CM

## 2021-01-12 DIAGNOSIS — D631 Anemia in chronic kidney disease: Secondary | ICD-10-CM | POA: Diagnosis present

## 2021-01-12 DIAGNOSIS — J439 Emphysema, unspecified: Secondary | ICD-10-CM | POA: Diagnosis not present

## 2021-01-12 DIAGNOSIS — G4489 Other headache syndrome: Secondary | ICD-10-CM | POA: Diagnosis not present

## 2021-01-12 DIAGNOSIS — J9611 Chronic respiratory failure with hypoxia: Secondary | ICD-10-CM | POA: Diagnosis present

## 2021-01-12 DIAGNOSIS — R1310 Dysphagia, unspecified: Secondary | ICD-10-CM | POA: Diagnosis not present

## 2021-01-12 DIAGNOSIS — Z7952 Long term (current) use of systemic steroids: Secondary | ICD-10-CM | POA: Diagnosis not present

## 2021-01-12 DIAGNOSIS — I5043 Acute on chronic combined systolic (congestive) and diastolic (congestive) heart failure: Secondary | ICD-10-CM

## 2021-01-12 DIAGNOSIS — T464X5A Adverse effect of angiotensin-converting-enzyme inhibitors, initial encounter: Secondary | ICD-10-CM | POA: Diagnosis not present

## 2021-01-12 DIAGNOSIS — I700 Atherosclerosis of aorta: Secondary | ICD-10-CM | POA: Diagnosis present

## 2021-01-12 DIAGNOSIS — Z79899 Other long term (current) drug therapy: Secondary | ICD-10-CM | POA: Diagnosis not present

## 2021-01-12 DIAGNOSIS — R188 Other ascites: Secondary | ICD-10-CM

## 2021-01-12 DIAGNOSIS — J189 Pneumonia, unspecified organism: Principal | ICD-10-CM

## 2021-01-12 DIAGNOSIS — R000 Tachycardia, unspecified: Secondary | ICD-10-CM | POA: Diagnosis not present

## 2021-01-12 DIAGNOSIS — R6889 Other general symptoms and signs: Secondary | ICD-10-CM | POA: Diagnosis not present

## 2021-01-12 DIAGNOSIS — R404 Transient alteration of awareness: Secondary | ICD-10-CM | POA: Diagnosis not present

## 2021-01-12 DIAGNOSIS — R42 Dizziness and giddiness: Secondary | ICD-10-CM | POA: Diagnosis not present

## 2021-01-12 DIAGNOSIS — E875 Hyperkalemia: Secondary | ICD-10-CM | POA: Diagnosis not present

## 2021-01-12 DIAGNOSIS — D696 Thrombocytopenia, unspecified: Secondary | ICD-10-CM | POA: Diagnosis present

## 2021-01-12 DIAGNOSIS — T783XXA Angioneurotic edema, initial encounter: Secondary | ICD-10-CM | POA: Diagnosis not present

## 2021-01-12 DIAGNOSIS — J320 Chronic maxillary sinusitis: Secondary | ICD-10-CM | POA: Diagnosis not present

## 2021-01-12 DIAGNOSIS — K5900 Constipation, unspecified: Secondary | ICD-10-CM | POA: Diagnosis not present

## 2021-01-12 DIAGNOSIS — R1110 Vomiting, unspecified: Secondary | ICD-10-CM | POA: Diagnosis not present

## 2021-01-12 DIAGNOSIS — N186 End stage renal disease: Secondary | ICD-10-CM

## 2021-01-12 DIAGNOSIS — R112 Nausea with vomiting, unspecified: Secondary | ICD-10-CM | POA: Diagnosis not present

## 2021-01-12 DIAGNOSIS — I10 Essential (primary) hypertension: Secondary | ICD-10-CM | POA: Diagnosis present

## 2021-01-12 DIAGNOSIS — I4891 Unspecified atrial fibrillation: Secondary | ICD-10-CM | POA: Diagnosis not present

## 2021-01-12 DIAGNOSIS — Z841 Family history of disorders of kidney and ureter: Secondary | ICD-10-CM

## 2021-01-12 DIAGNOSIS — I132 Hypertensive heart and chronic kidney disease with heart failure and with stage 5 chronic kidney disease, or end stage renal disease: Secondary | ICD-10-CM | POA: Diagnosis present

## 2021-01-12 DIAGNOSIS — N2581 Secondary hyperparathyroidism of renal origin: Secondary | ICD-10-CM | POA: Diagnosis present

## 2021-01-12 DIAGNOSIS — R110 Nausea: Secondary | ICD-10-CM | POA: Diagnosis not present

## 2021-01-12 DIAGNOSIS — Y92239 Unspecified place in hospital as the place of occurrence of the external cause: Secondary | ICD-10-CM | POA: Diagnosis not present

## 2021-01-12 DIAGNOSIS — Z743 Need for continuous supervision: Secondary | ICD-10-CM | POA: Diagnosis not present

## 2021-01-12 DIAGNOSIS — I5023 Acute on chronic systolic (congestive) heart failure: Secondary | ICD-10-CM | POA: Diagnosis not present

## 2021-01-12 NOTE — ED Triage Notes (Signed)
[place] via [facility] with complaints of lightheaded, dizzy, N/V/diarrhea x 4 then pt called EMS  per EMS pt is MWF and Friday session was stopped short for pt feeling nauseated  Pt reports the atarax pill was stuck throat -- takes for upper leg itching for the last year   History of HTN and DM

## 2021-01-13 ENCOUNTER — Encounter: Payer: Self-pay | Admitting: Emergency Medicine

## 2021-01-13 ENCOUNTER — Emergency Department: Payer: Medicare Other

## 2021-01-13 DIAGNOSIS — I700 Atherosclerosis of aorta: Secondary | ICD-10-CM | POA: Diagnosis present

## 2021-01-13 DIAGNOSIS — N2581 Secondary hyperparathyroidism of renal origin: Secondary | ICD-10-CM | POA: Diagnosis present

## 2021-01-13 DIAGNOSIS — T464X5A Adverse effect of angiotensin-converting-enzyme inhibitors, initial encounter: Secondary | ICD-10-CM | POA: Diagnosis not present

## 2021-01-13 DIAGNOSIS — I132 Hypertensive heart and chronic kidney disease with heart failure and with stage 5 chronic kidney disease, or end stage renal disease: Secondary | ICD-10-CM | POA: Diagnosis present

## 2021-01-13 DIAGNOSIS — K5900 Constipation, unspecified: Secondary | ICD-10-CM | POA: Diagnosis not present

## 2021-01-13 DIAGNOSIS — Y92239 Unspecified place in hospital as the place of occurrence of the external cause: Secondary | ICD-10-CM | POA: Diagnosis not present

## 2021-01-13 DIAGNOSIS — E875 Hyperkalemia: Secondary | ICD-10-CM | POA: Diagnosis not present

## 2021-01-13 DIAGNOSIS — J9611 Chronic respiratory failure with hypoxia: Secondary | ICD-10-CM | POA: Diagnosis present

## 2021-01-13 DIAGNOSIS — I4891 Unspecified atrial fibrillation: Secondary | ICD-10-CM | POA: Diagnosis present

## 2021-01-13 DIAGNOSIS — R197 Diarrhea, unspecified: Secondary | ICD-10-CM | POA: Diagnosis present

## 2021-01-13 DIAGNOSIS — Z79899 Other long term (current) drug therapy: Secondary | ICD-10-CM | POA: Diagnosis not present

## 2021-01-13 DIAGNOSIS — J189 Pneumonia, unspecified organism: Secondary | ICD-10-CM | POA: Diagnosis present

## 2021-01-13 DIAGNOSIS — J439 Emphysema, unspecified: Secondary | ICD-10-CM | POA: Diagnosis present

## 2021-01-13 DIAGNOSIS — Z992 Dependence on renal dialysis: Secondary | ICD-10-CM | POA: Diagnosis not present

## 2021-01-13 DIAGNOSIS — Z841 Family history of disorders of kidney and ureter: Secondary | ICD-10-CM | POA: Diagnosis not present

## 2021-01-13 DIAGNOSIS — I10 Essential (primary) hypertension: Secondary | ICD-10-CM | POA: Diagnosis not present

## 2021-01-13 DIAGNOSIS — I5042 Chronic combined systolic (congestive) and diastolic (congestive) heart failure: Secondary | ICD-10-CM | POA: Diagnosis present

## 2021-01-13 DIAGNOSIS — D696 Thrombocytopenia, unspecified: Secondary | ICD-10-CM | POA: Diagnosis present

## 2021-01-13 DIAGNOSIS — Z7952 Long term (current) use of systemic steroids: Secondary | ICD-10-CM | POA: Diagnosis not present

## 2021-01-13 DIAGNOSIS — R188 Other ascites: Secondary | ICD-10-CM | POA: Diagnosis present

## 2021-01-13 DIAGNOSIS — N186 End stage renal disease: Secondary | ICD-10-CM | POA: Diagnosis present

## 2021-01-13 DIAGNOSIS — Z20822 Contact with and (suspected) exposure to covid-19: Secondary | ICD-10-CM | POA: Diagnosis present

## 2021-01-13 DIAGNOSIS — F17210 Nicotine dependence, cigarettes, uncomplicated: Secondary | ICD-10-CM | POA: Diagnosis present

## 2021-01-13 DIAGNOSIS — D631 Anemia in chronic kidney disease: Secondary | ICD-10-CM | POA: Diagnosis present

## 2021-01-13 DIAGNOSIS — T783XXA Angioneurotic edema, initial encounter: Secondary | ICD-10-CM | POA: Diagnosis not present

## 2021-01-13 DIAGNOSIS — Z833 Family history of diabetes mellitus: Secondary | ICD-10-CM | POA: Diagnosis not present

## 2021-01-13 LAB — RESPIRATORY PANEL BY PCR

## 2021-01-13 LAB — CBC
HCT: 32 % — ABNORMAL LOW (ref 39.0–52.0)
HCT: 33.7 % — ABNORMAL LOW (ref 39.0–52.0)
Hemoglobin: 10.3 g/dL — ABNORMAL LOW (ref 13.0–17.0)
Hemoglobin: 10.9 g/dL — ABNORMAL LOW (ref 13.0–17.0)
MCH: 30.8 pg (ref 26.0–34.0)
MCH: 30.8 pg (ref 26.0–34.0)
MCHC: 32.2 g/dL (ref 30.0–36.0)
MCHC: 32.3 g/dL (ref 30.0–36.0)
MCV: 95.2 fL (ref 80.0–100.0)
MCV: 95.8 fL (ref 80.0–100.0)
Platelets: 115 10*3/uL — ABNORMAL LOW (ref 150–400)
Platelets: 126 10*3/uL — ABNORMAL LOW (ref 150–400)
RBC: 3.34 MIL/uL — ABNORMAL LOW (ref 4.22–5.81)
RBC: 3.54 MIL/uL — ABNORMAL LOW (ref 4.22–5.81)
RDW: 14 % (ref 11.5–15.5)
RDW: 14 % (ref 11.5–15.5)
WBC: 8.7 10*3/uL (ref 4.0–10.5)
WBC: 9.4 10*3/uL (ref 4.0–10.5)
nRBC: 0 % (ref 0.0–0.2)
nRBC: 0 % (ref 0.0–0.2)

## 2021-01-13 LAB — RESP PANEL BY RT-PCR (FLU A&B, COVID) ARPGX2
Influenza A by PCR: NEGATIVE
Influenza B by PCR: NEGATIVE
SARS Coronavirus 2 by RT PCR: NEGATIVE

## 2021-01-13 LAB — COMPREHENSIVE METABOLIC PANEL
ALT: 10 U/L (ref 0–44)
AST: 15 U/L (ref 15–41)
Albumin: 4 g/dL (ref 3.5–5.0)
Alkaline Phosphatase: 156 U/L — ABNORMAL HIGH (ref 38–126)
Anion gap: 19 — ABNORMAL HIGH (ref 5–15)
BUN: 42 mg/dL — ABNORMAL HIGH (ref 6–20)
CO2: 26 mmol/L (ref 22–32)
Calcium: 9.2 mg/dL (ref 8.9–10.3)
Chloride: 95 mmol/L — ABNORMAL LOW (ref 98–111)
Creatinine, Ser: 10.79 mg/dL — ABNORMAL HIGH (ref 0.61–1.24)
GFR, Estimated: 5 mL/min — ABNORMAL LOW (ref >60)
Glucose, Bld: 91 mg/dL (ref 70–99)
Potassium: 4.7 mmol/L (ref 3.5–5.1)
Sodium: 140 mmol/L (ref 135–145)
Total Bilirubin: 1.5 mg/dL — ABNORMAL HIGH (ref 0.3–1.2)
Total Protein: 8.4 g/dL — ABNORMAL HIGH (ref 6.5–8.1)

## 2021-01-13 LAB — TROPONIN I (HIGH SENSITIVITY)
Troponin I (High Sensitivity): 62 ng/L — ABNORMAL HIGH (ref <18)
Troponin I (High Sensitivity): 64 ng/L — ABNORMAL HIGH (ref <18)

## 2021-01-13 LAB — URINALYSIS, ROUTINE W REFLEX MICROSCOPIC
Bilirubin Urine: NEGATIVE
Glucose, UA: 150 mg/dL — AB
Hgb urine dipstick: NEGATIVE
Ketones, ur: NEGATIVE mg/dL
Leukocytes,Ua: NEGATIVE
Nitrite: NEGATIVE
Protein, ur: >=300 mg/dL — AB
Specific Gravity, Urine: 1.012 (ref 1.005–1.030)
Squamous Epithelial / HPF: NONE SEEN (ref 0–5)
pH: 9 — ABNORMAL HIGH (ref 5.0–8.0)

## 2021-01-13 LAB — CREATININE, SERUM
Creatinine, Ser: 10.98 mg/dL — ABNORMAL HIGH (ref 0.61–1.24)
GFR, Estimated: 5 mL/min — ABNORMAL LOW (ref >60)

## 2021-01-13 LAB — PROCALCITONIN: Procalcitonin: 0.54 ng/mL

## 2021-01-13 LAB — ETHANOL: Alcohol, Ethyl (B): <10 mg/dL (ref <10)

## 2021-01-13 LAB — HIV ANTIBODY (ROUTINE TESTING W REFLEX): HIV Screen 4th Generation wRfx: NONREACTIVE

## 2021-01-13 LAB — LIPASE, BLOOD: Lipase: 45 U/L (ref 11–51)

## 2021-01-13 IMAGING — CR DG CHEST 2V
1 series · 2 of 2 positions shown · non-contrast
Comparison: [DATE]

CLINICAL DATA: Shortness of breath. Lightheaded. Dizzy, nausea,
vomiting, diarrhea.

EXAM:
CHEST - 2 VIEW

[Series 1: dg chest 2 view · 0.14mm/px · 2 of 2 slices shown]
[im 1/2]
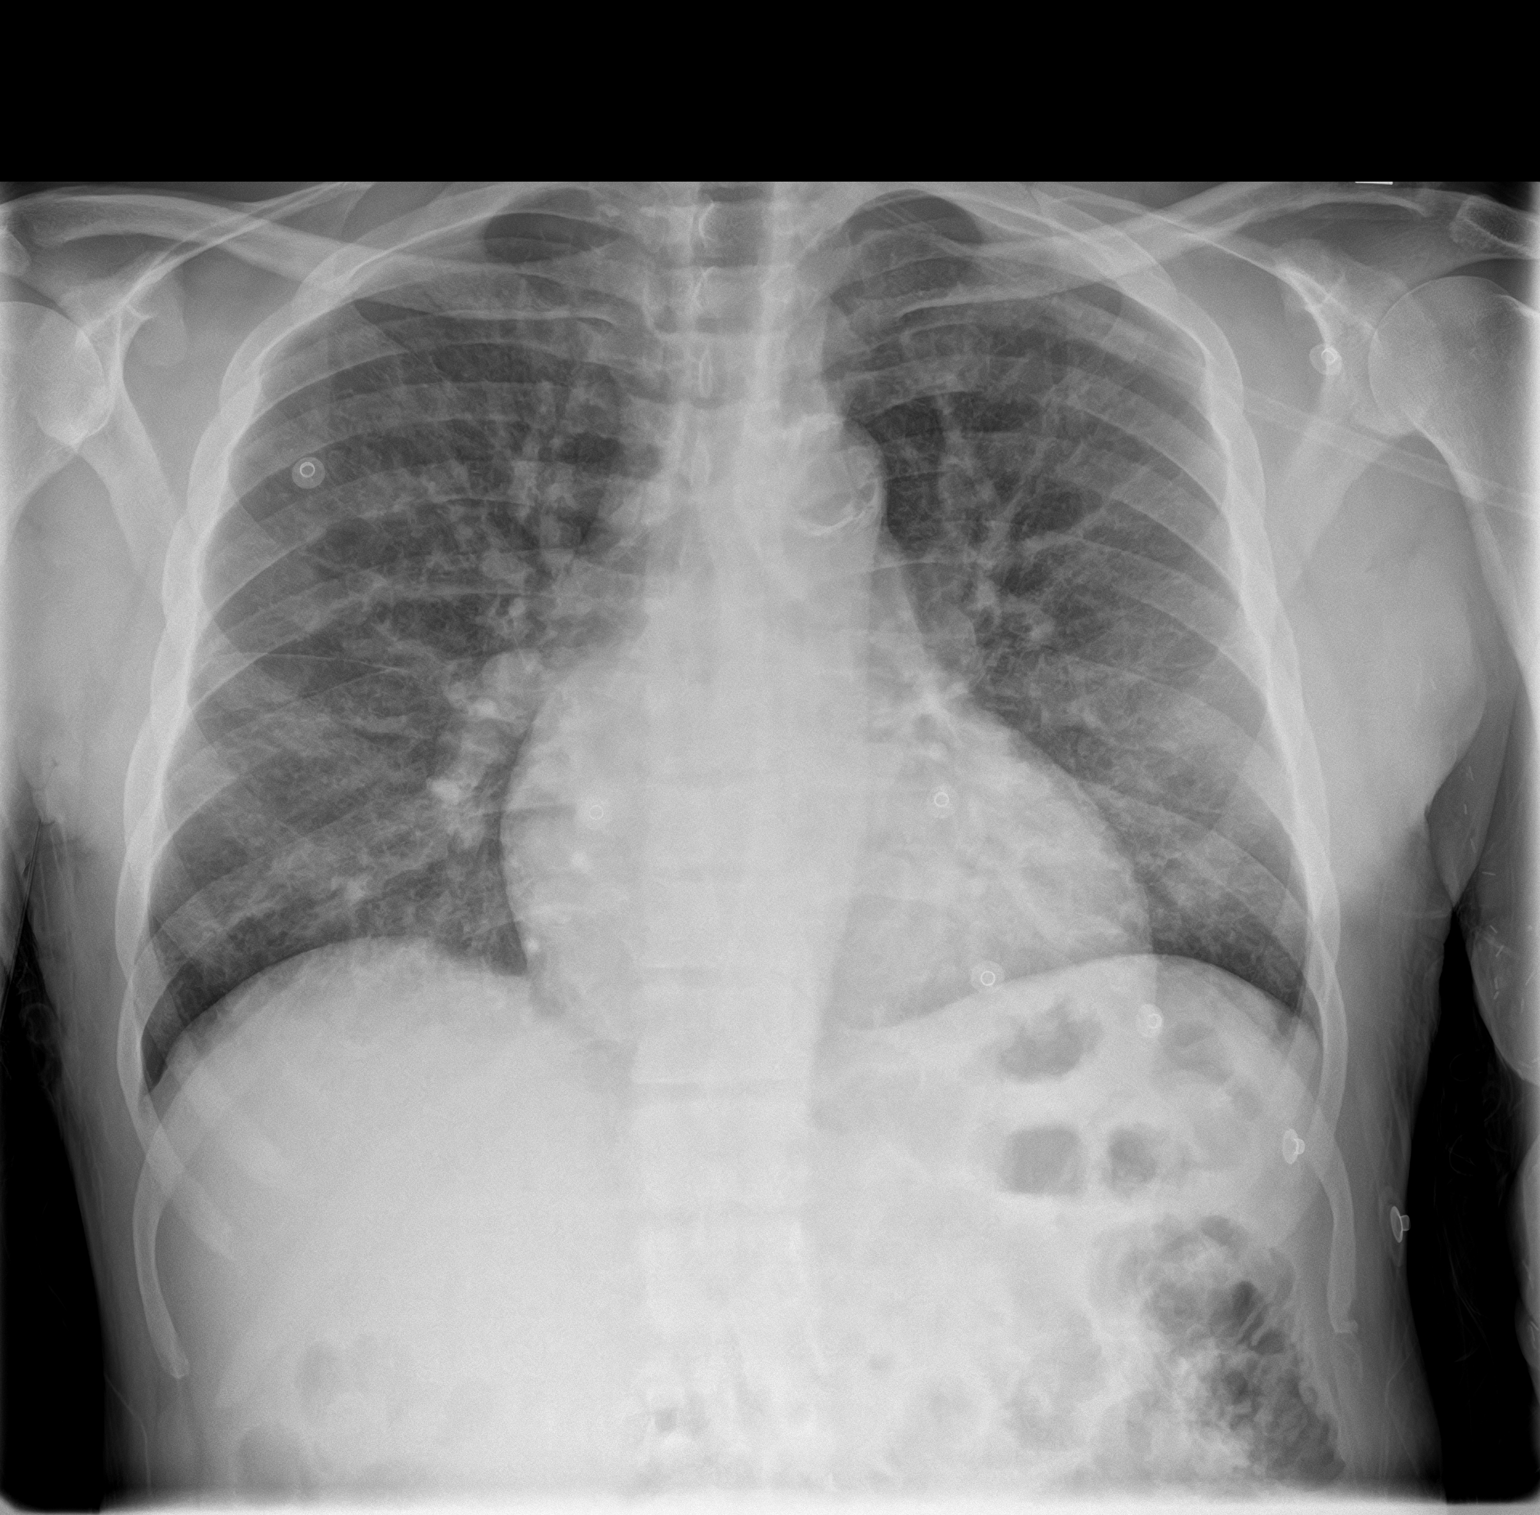
[im 2/2]
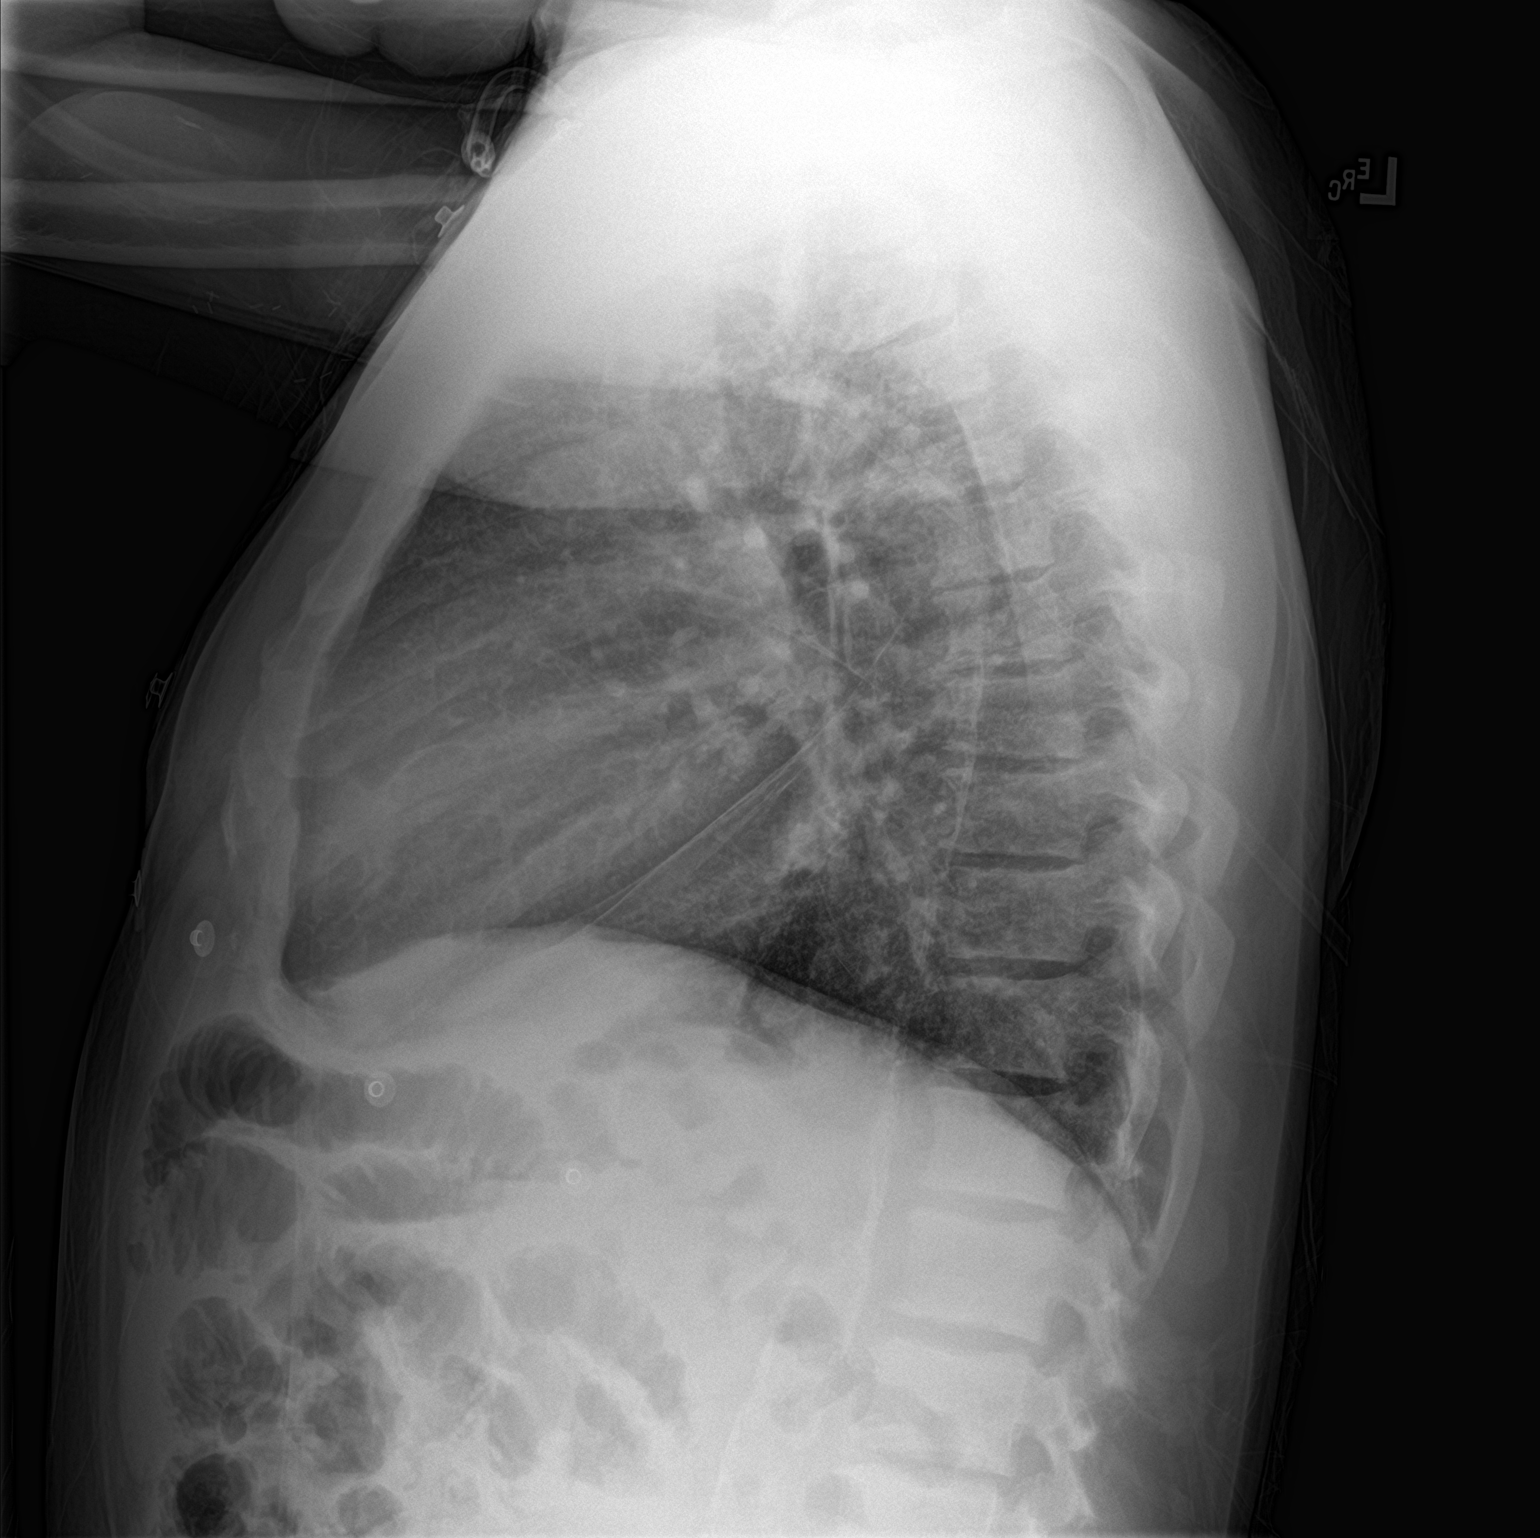

[2 of 2 positions shown; findings below may reference images not displayed]

FINDINGS: Shallow inspiration. Cardiac enlargement. Patchy infiltrates and
peribronchial thickening in the lungs with perihilar distribution.
Changes suggest multifocal pneumonia. No pleural effusions. No
pneumothorax. Mediastinal contours appear intact. Calcification of
the aorta.
IMPRESSION: Cardiac enlargement. Patchy perihilar infiltrates and peribronchial
thickening suggesting multifocal pneumonia.

## 2021-01-13 IMAGING — CT CT ABD-PELV W/ CM
2 of 5 series · 15 of 46 positions shown, 17 images · IV contrast (omnipaque)
Comparison: [DATE]

CLINICAL DATA: Acute diffuse abdominal pain. Lightheaded and dizzy.
Nausea, vomiting, and diarrhea.

EXAM:
CT ABDOMEN AND PELVIS WITH CONTRAST
TECHNIQUE: Multidetector CT imaging of the abdomen and pelvis was performed
using the standard protocol following bolus administration of
intravenous contrast.
CONTRAST:  75mL OMNIPAQUE IOHEXOL 300 MG/ML  SOLN

[Series 2: routine abd/pel with · axial · 0.80mm/px · z∈[-946,-556]mm · 12 of 90 slices shown, 14 images]
[im 6/90  soft-tissue]
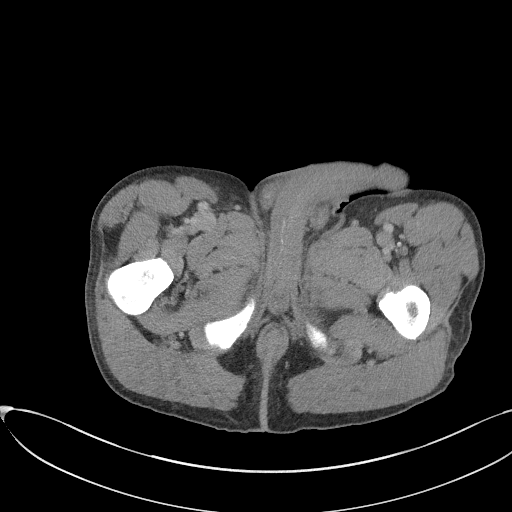
[im 6/90  bone]
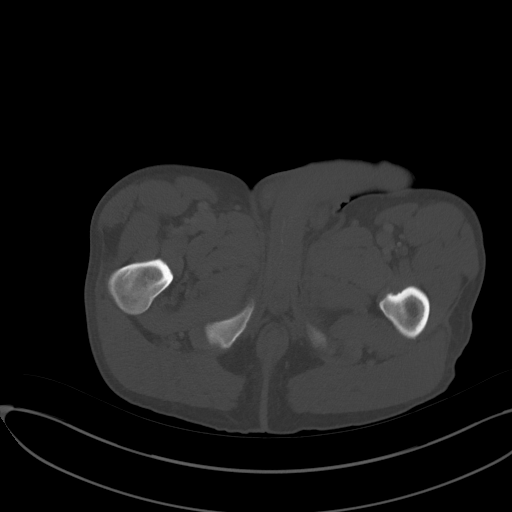
[im 12/90  soft-tissue]
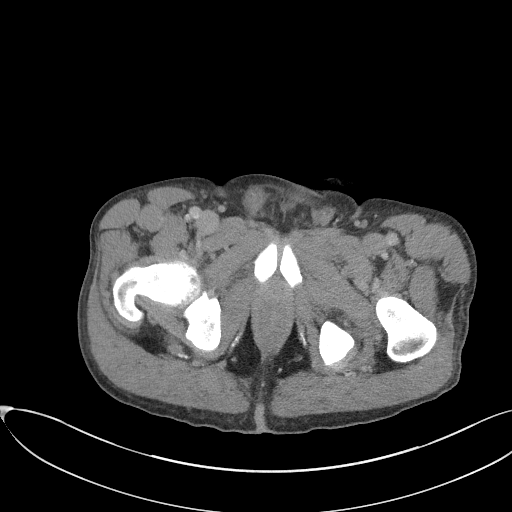
[im 23/90  soft-tissue]
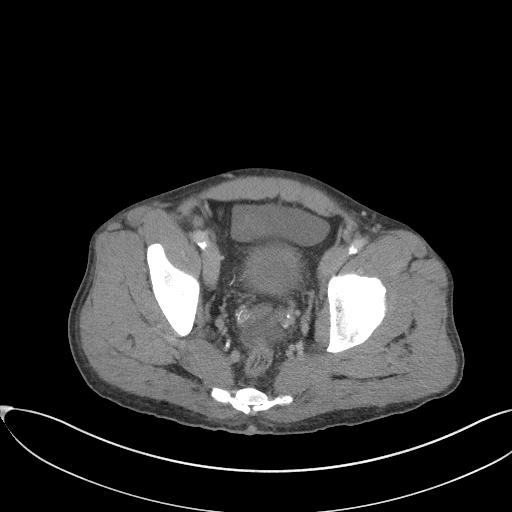
[im 28/90  soft-tissue]
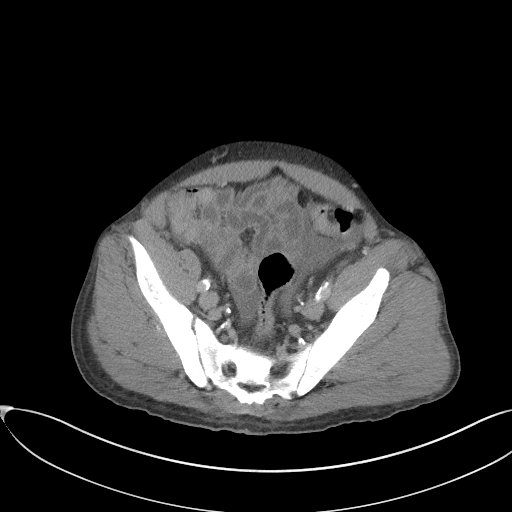
[im 34/90  soft-tissue]
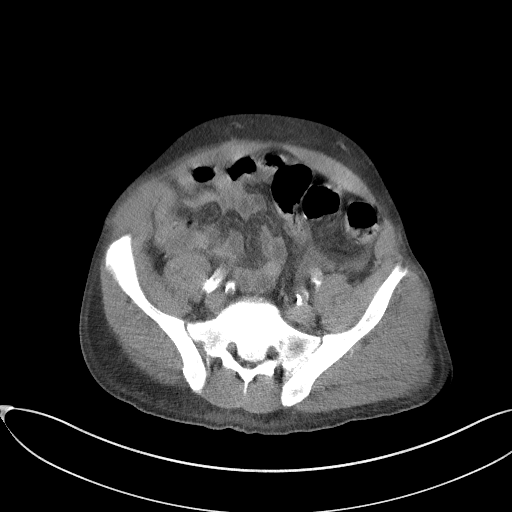
[im 39/90  soft-tissue]
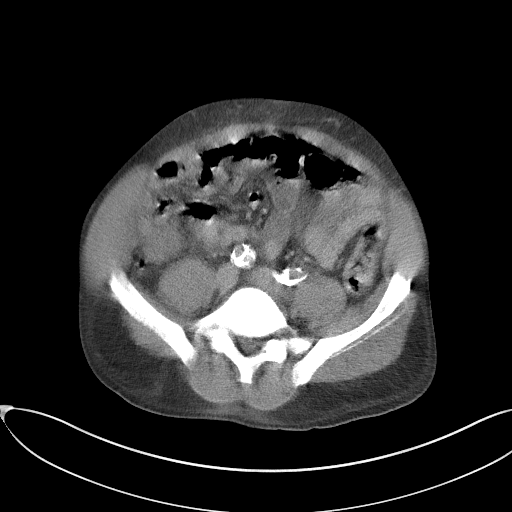
[im 51/90  soft-tissue]
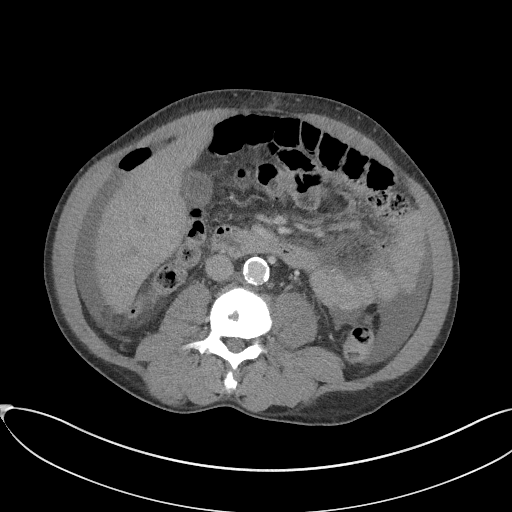
[im 56/90  soft-tissue]
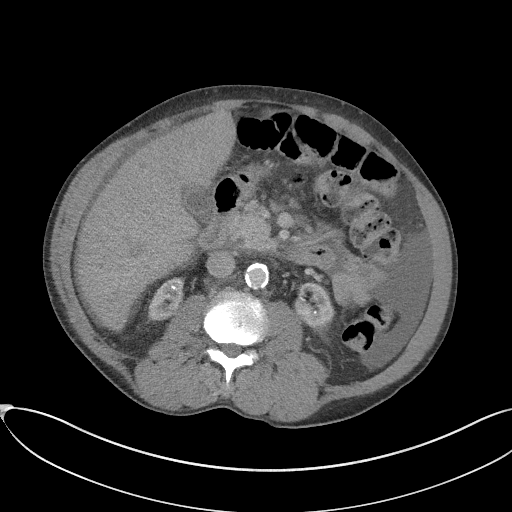
[im 62/90  soft-tissue]
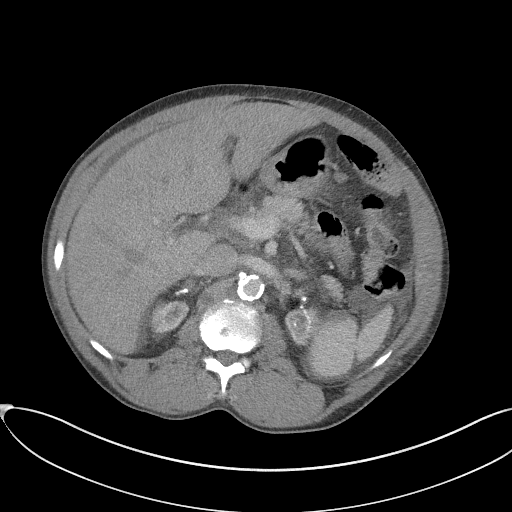
[im 62/90  bone]
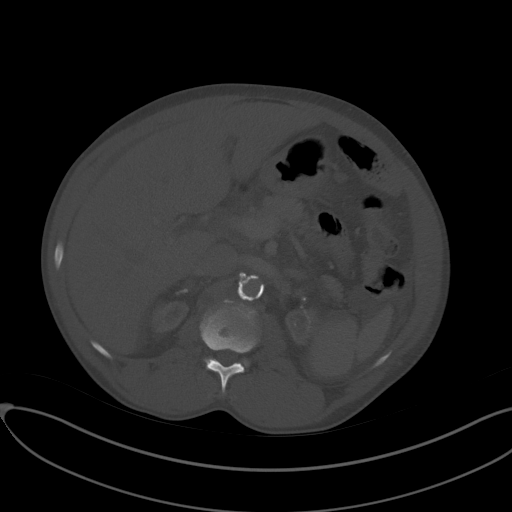
[im 67/90  soft-tissue]
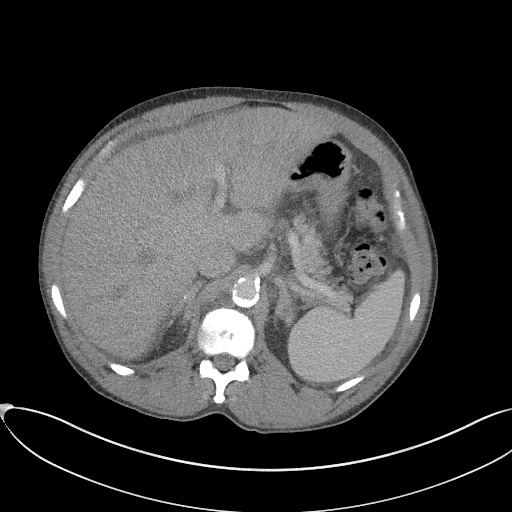
[im 78/90  soft-tissue]
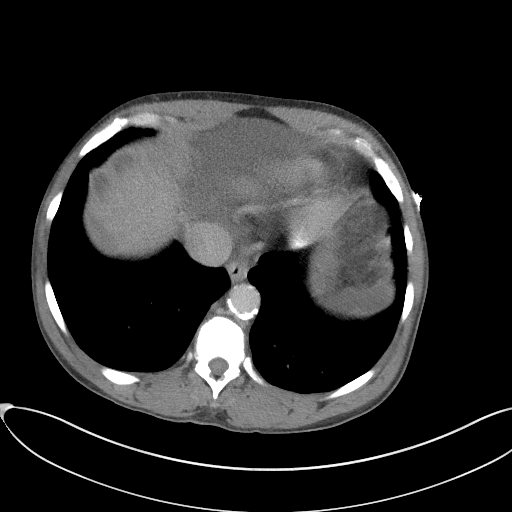
[im 84/90  soft-tissue]
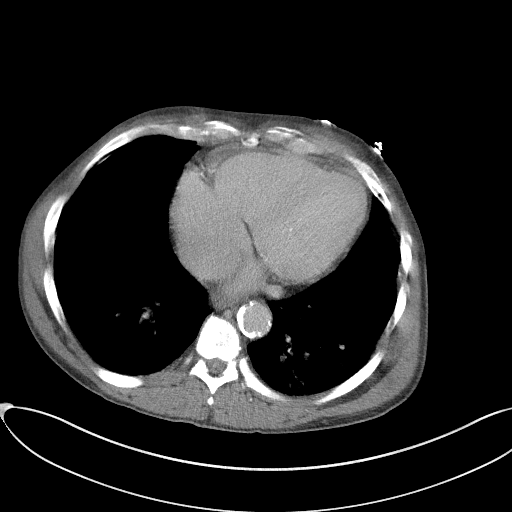

[Series 5: coronal st · coronal · 0.69mm/px · 3 of 95 slices shown]
[im 32/95  soft-tissue]
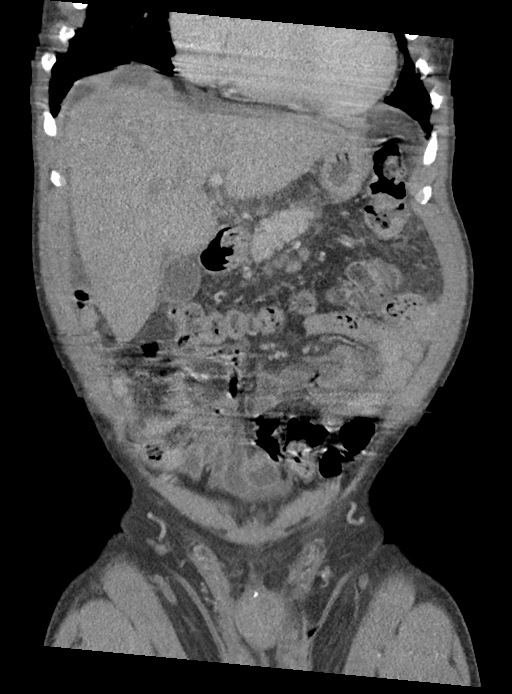
[im 42/95  soft-tissue]
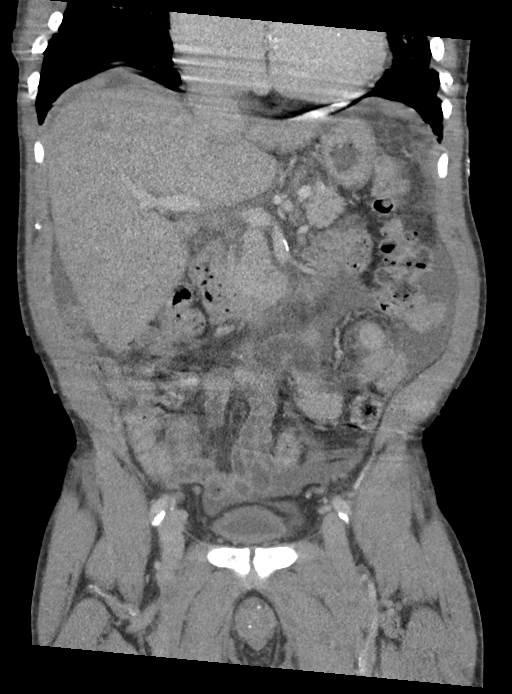
[im 53/95  soft-tissue]
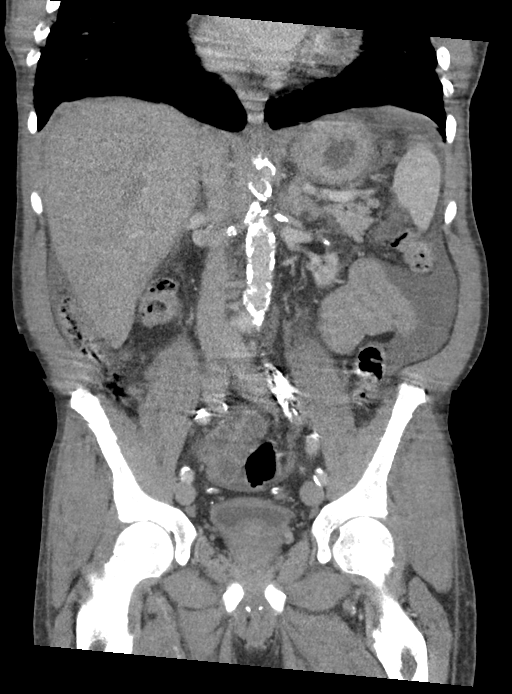

[15 of 46 positions shown; findings below may reference images not displayed]

FINDINGS: Lower chest: Motion artifact limits examination. Lung bases appear
grossly clear. Cardiac enlargement.

Hepatobiliary: No focal liver lesions. Gallbladder wall thickening
and edema likely related to ascites. No gallstones. No bile duct
dilatation.

Pancreas: Unremarkable. No pancreatic ductal dilatation or
surrounding inflammatory changes.

Spleen: Normal in size without focal abnormality.

Adrenals/Urinary Tract: No adrenal gland nodules. Prominent
bilateral renal parenchymal atrophy. Intrarenal vascular
calcifications. No hydronephrosis or hydroureter. Bladder wall is
diffusely thickened which may be due to outlet obstruction or
cystitis.

Stomach/Bowel: The stomach, small bowel, and colon are not
abnormally distended. Stool fills the colon. No obvious inflammatory
changes are demonstrated. Appendix is not identified.

Vascular/Lymphatic: Diffuse aortic and branch vessel calcifications.
Displaced calcifications are similar to prior study and may
represent old dissection without interval progression. Calcification
and thrombus of the origin of the right iliac artery, similar to
prior study. No significant lymphadenopathy.

Reproductive: Prostate gland is enlarged.

Other: Moderate free fluid throughout the abdomen and pelvis, likely
ascites. No free air.

Musculoskeletal: Diffuse bone sclerosis likely representing renal
osteodystrophy.
IMPRESSION: 1. Moderate free fluid throughout the abdomen and pelvis, likely
ascites.
2. Cardiac enlargement.
3. Prominent bilateral renal parenchymal atrophy.
4. Bladder wall thickening may be due to outlet obstruction or
cystitis.
5. Prominent aortic and abdominal arterial atherosclerosis.
6. Diffuse bone sclerosis likely representing renal osteodystrophy.

Aortic Atherosclerosis ([ZB]-[ZB]).

## 2021-01-13 MED ORDER — ONDANSETRON HCL 4 MG/2ML IJ SOLN
4.0000 mg | Freq: Four times a day (QID) | INTRAMUSCULAR | Status: DC | PRN
  Administered 2021-01-13: 4 mg via INTRAVENOUS
  Filled 2021-01-13: qty 2

## 2021-01-13 MED ORDER — SODIUM CHLORIDE 0.9 % IV SOLN
2.0000 g | INTRAVENOUS | Status: DC
  Administered 2021-01-13 – 2021-01-15 (×3): 2 g via INTRAVENOUS
  Filled 2021-01-13 (×4): qty 20

## 2021-01-13 MED ORDER — CALCIUM ACETATE (PHOS BINDER) 667 MG PO CAPS
1334.0000 mg | ORAL_CAPSULE | Freq: Three times a day (TID) | ORAL | Status: DC
  Administered 2021-01-13 – 2021-01-15 (×2): 1334 mg via ORAL
  Filled 2021-01-13 (×3): qty 2

## 2021-01-13 MED ORDER — CHLORHEXIDINE GLUCONATE CLOTH 2 % EX PADS
6.0000 | MEDICATED_PAD | Freq: Every day | CUTANEOUS | Status: DC
  Administered 2021-01-15: 6 via TOPICAL

## 2021-01-13 MED ORDER — THIAMINE HCL 100 MG PO TABS: 100.0000 mg | ORAL_TABLET | ORAL | Status: DC

## 2021-01-13 MED ORDER — SODIUM CHLORIDE 0.9 % IV SOLN: 2.0000 g | INTRAVENOUS | Status: DC

## 2021-01-13 MED ORDER — SENNOSIDES-DOCUSATE SODIUM 8.6-50 MG PO TABS
1.0000 | ORAL_TABLET | Freq: Two times a day (BID) | ORAL | Status: DC
  Administered 2021-01-13 (×2): 1 via ORAL
  Filled 2021-01-13 (×5): qty 1

## 2021-01-13 MED ORDER — GABAPENTIN 100 MG PO CAPS: 100.0000 mg | ORAL_CAPSULE | Freq: Every day | ORAL | Status: DC

## 2021-01-13 MED ORDER — HYDRALAZINE HCL 50 MG PO TABS: 50.0000 mg | ORAL_TABLET | Freq: Three times a day (TID) | ORAL | Status: DC | PRN

## 2021-01-13 MED ORDER — HYDROXYZINE HCL 25 MG PO TABS
25.0000 mg | ORAL_TABLET | Freq: Four times a day (QID) | ORAL | Status: DC
  Administered 2021-01-13 – 2021-01-15 (×6): 25 mg via ORAL
  Filled 2021-01-13 (×7): qty 1

## 2021-01-13 MED ORDER — SODIUM CHLORIDE 0.9 % IV SOLN
500.0000 mg | INTRAVENOUS | Status: DC
  Administered 2021-01-13 – 2021-01-15 (×3): 500 mg via INTRAVENOUS
  Filled 2021-01-13 (×4): qty 500

## 2021-01-13 MED ORDER — OLANZAPINE 2.5 MG PO TABS: 2.5000 mg | ORAL_TABLET | Freq: Every day | ORAL | Status: DC

## 2021-01-13 MED ORDER — EPOETIN ALFA 10000 UNIT/ML IJ SOLN
4000.0000 [IU] | INTRAMUSCULAR | Status: DC
  Administered 2021-01-14: 4000 [IU] via INTRAVENOUS

## 2021-01-13 MED ORDER — ONDANSETRON HCL 4 MG/2ML IJ SOLN: 4.0000 mg | Freq: Once | INTRAMUSCULAR | Status: DC | PRN

## 2021-01-13 MED ORDER — MORPHINE SULFATE (PF) 4 MG/ML IV SOLN
4.0000 mg | Freq: Once | INTRAVENOUS | Status: AC
Start: 2021-01-13 — End: 2021-01-13
  Administered 2021-01-13: 4 mg via INTRAVENOUS
  Filled 2021-01-13: qty 1

## 2021-01-13 MED ORDER — HEPARIN SODIUM (PORCINE) 5000 UNIT/ML IJ SOLN
5000.0000 [IU] | Freq: Three times a day (TID) | INTRAMUSCULAR | Status: DC
  Administered 2021-01-13 – 2021-01-15 (×6): 5000 [IU] via SUBCUTANEOUS
  Filled 2021-01-13 (×6): qty 1

## 2021-01-13 MED ORDER — VANCOMYCIN HCL 1750 MG/350ML IV SOLN
1750.0000 mg | Freq: Once | INTRAVENOUS | Status: AC
  Administered 2021-01-13: 1750 mg via INTRAVENOUS
  Filled 2021-01-13: qty 350

## 2021-01-13 MED ORDER — LISINOPRIL 10 MG PO TABS
10.0000 mg | ORAL_TABLET | Freq: Every day | ORAL | Status: DC
  Administered 2021-01-13: 10 mg via ORAL
  Filled 2021-01-13: qty 1

## 2021-01-13 MED ORDER — IOHEXOL 300 MG/ML  SOLN
75.0000 mL | Freq: Once | INTRAMUSCULAR | Status: AC | PRN
  Administered 2021-01-13: 75 mL via INTRAVENOUS

## 2021-01-13 MED ORDER — TRIAMCINOLONE ACETONIDE 0.1 % EX CREA
TOPICAL_CREAM | Freq: Three times a day (TID) | CUTANEOUS | Status: DC
  Filled 2021-01-13 (×2): qty 15

## 2021-01-13 MED ORDER — SODIUM CHLORIDE 0.9 % IV SOLN: 500.0000 mg | INTRAVENOUS | Status: DC

## 2021-01-13 MED ORDER — CINACALCET HCL 30 MG PO TABS
90.0000 mg | ORAL_TABLET | Freq: Every evening | ORAL | Status: DC
  Filled 2021-01-13 (×4): qty 3

## 2021-01-13 MED ORDER — ONDANSETRON HCL 4 MG/2ML IJ SOLN
4.0000 mg | Freq: Once | INTRAMUSCULAR | Status: AC
  Administered 2021-01-13: 4 mg via INTRAVENOUS
  Filled 2021-01-13: qty 2

## 2021-01-13 MED ORDER — SODIUM CHLORIDE 0.9 % IV SOLN
2.0000 g | Freq: Once | INTRAVENOUS | Status: AC
  Administered 2021-01-13: 2 g via INTRAVENOUS
  Filled 2021-01-13: qty 2

## 2021-01-13 NOTE — ED Notes (Signed)
Lab, [place], called again for troponin add on

## 2021-01-13 NOTE — ED Provider Notes (Signed)
[facility] Emergency Department Provider Note  ____________________________________________   Event Date/Time   First MD Initiated Contact with Patient 01/12/21 2359     (approximate)  I have reviewed the triage vital signs and the nursing notes.   HISTORY  Chief Complaint Abdominal Pain, Emesis, and Diarrhea    HPI Jordan [name] is a 55 y.o. male with history of end-stage renal disease on hemodialysis Monday, Wednesday and Friday, hypertension, alcohol abuse, atrial fibrillation, emphysema who presents to the emergency department with complaints of generalized abdominal pain that he is unable to describe without radiation, nausea and vomiting x1, diarrhea x4.  No measured fevers but states he did feel hot earlier today.  Reports subjective fever.  No chest pain but has had shortness of breath.  Reports nonproductive cough.  Last dialysis was Friday, April 22.  No missed dialysis.  No previous abdominal surgeries.  No sick contacts, recent travel, hospitalization or antibiotic use.    History limited as patient is a very poor historian.    Past Medical History:  Diagnosis Date  . Anemia   . Aortic atherosclerosis ([place]) 11/12/2019  . CKD (chronic kidney disease)    Stage 5  Dialysis - M/W/F in [place], [place]  . Dyspnea    tx with inhaler when sick  . ED (erectile dysfunction)   . Emphysema of lung ([place]) 11/12/2019  . ETOH abuse   . History of blood transfusion   . Hypertension   . Wears dentures     Patient Active Problem List   Diagnosis Date Noted  . Pneumonia 01/13/2021  . Current mild episode of major depressive disorder without prior episode ([place]) 04/09/2020  . Atrial fibrillation with RVR ([place]) 04/04/2020  . 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 Aortic atherosclerosis ([place]) 11/12/2019  . Emphysema of lung ([place]) 11/12/2019  . Anemia in ESRD (end-stage renal disease) ([place]) 05/17/2018  . ESRD on  dialysis ([place]) 05/26/2017  . Moderate protein-calorie malnutrition ([place]) 08/13/2016  . Secondary hyperparathyroidism of renal origin ([place]) 08/13/2016    Past Surgical History:  Procedure Laterality Date  . BASCILIC VEIN TRANSPOSITION Left 08/07/2016   Procedure: LEFT BASILIC VEIN TRANSPOSITION;  Surgeon: Angelia Mould, MD;  Location: [facility];  Service: Vascular;  Laterality: Left;  . CLOSED REDUCTION NASAL FRACTURE N/A 08/11/2019   Procedure: CLOSED REDUCTION NASAL FRACTURE WITH STABILIZATION;  Surgeon: Irene Limbo, MD;  Location: [facility];  Service: Plastics;  Laterality: N/A;  . COLONOSCOPY N/A 08/12/2016   Procedure: COLONOSCOPY;  Surgeon: Otis Brace, MD;  Location: [facility];  Service: Gastroenterology;  Laterality: N/A;  . ESOPHAGOGASTRODUODENOSCOPY N/A 08/12/2016   Procedure: ESOPHAGOGASTRODUODENOSCOPY (EGD);  Surgeon: Otis Brace, MD;  Location: [facility];  Service: Gastroenterology;  Laterality: N/A;  . INSERTION OF DIALYSIS CATHETER N/A 08/07/2016   Procedure: INSERTION OF TUNNELED DIALYSIS CATHETER;  Surgeon: Angelia Mould, MD;  Location: [facility];  Service: Vascular;  Laterality: N/A;  . LIGATION OF ARTERIOVENOUS  FISTULA Left 09/12/2016   Procedure: BANDING OF LEFT  ARTERIOVENOUS  FISTULA;  Surgeon: Angelia Mould, MD;  Location: [facility];  Service: Vascular;  Laterality: Left;  . LOWER EXTREMITY INTERVENTION Right 12/02/2018   Procedure: LOWER EXTREMITY INTERVENTION;  Surgeon: Algernon Huxley, MD;  Location: [facility];  Service: Cardiovascular;  Laterality: Right;    Prior to Admission medications   Medication Sig Start Date End Date Taking? Authorizing Provider  calcium acetate (PHOSLO) 667 MG capsule  Take 2 capsules (1,334 mg total) by mouth 3 (three) times daily with meals. 02/12/19   Fritzi Mandes, MD  cephALEXin (KEFLEX) 500 MG capsule Take 1 capsule (500 mg total) by mouth 2 (two) times daily. 10/10/20   Veryl Speak, MD  cinacalcet  (SENSIPAR) 90 MG tablet Take 1 tablet by mouth every evening. 11/28/20   [provider]  epoetin alfa (EPOGEN) 4000 UNIT/ML injection Inject 1 mL (4,000 Units total) into the vein every Monday, Wednesday, and Friday with hemodialysis. 06/22/20   Enzo Bi, MD  gabapentin (NEURONTIN) 100 MG capsule Take 1 capsule (100 mg total) by mouth at bedtime. 08/21/20   Jearld Fenton, NP  hydrOXYzine (ATARAX/VISTARIL) 25 MG tablet Take 1 tablet (25 mg total) by mouth every 6 (six) hours. 10/10/20   Veryl Speak, MD  lisinopril (ZESTRIL) 10 MG tablet Take 1 tablet (10 mg total) by mouth daily. Blood pressure medication. 06/20/20 09/18/20  Enzo Bi, MD  OLANZapine (ZYPREXA) 2.5 MG tablet Take 1 tablet (2.5 mg total) by mouth at bedtime. 04/10/20   Candee Furbish, MD  predniSONE (DELTASONE) 10 MG tablet Take 2 tablets (20 mg total) by mouth 2 (two) times daily with a meal. 10/10/20   Veryl Speak, MD  sevelamer carbonate (RENVELA) 800 MG tablet Take 800 mg by mouth 3 (three) times daily with meals.    [provider]  thiamine 100 MG tablet Take 1 tablet (100 mg total) by mouth daily. 06/20/20   Enzo Bi, MD    Allergies Patient has no known allergies.  Family History  Problem Relation Age of Onset  . Diabetes Mother   . Kidney failure Mother   . Healthy Father   . Kidney failure Brother   . Healthy Sister   . Kidney disease Daughter   . Post-traumatic stress disorder Neg Hx   . Bladder Cancer Neg Hx   . Kidney cancer Neg Hx     Social History Social History   Tobacco Use  . Smoking status: Current Every Day Smoker    Packs/day: 0.50    Years: 40.00    Pack years: 20.00    Types: Cigarettes  . Smokeless tobacco: Never Used  Vaping Use  . Vaping Use: Never used  Substance Use Topics  . Alcohol use: Yes    Alcohol/week: 21.0 standard drinks    Types: 21 Cans of beer per week    Comment: last drink 6 months ago  . Drug use: No    Review of Systems Constitutional: No  fever. Eyes: No visual changes. ENT: No sore throat. Cardiovascular: Denies chest pain. Respiratory: Denies shortness of breath. Gastrointestinal: No nausea, vomiting, diarrhea. Genitourinary: Negative for dysuria. Musculoskeletal: Negative for back pain. Skin: Negative for rash. Neurological: Negative for focal weakness or numbness.  ____________________________________________   PHYSICAL EXAM:  VITAL SIGNS: ED Triage Vitals [01/12/21 2358]  Enc Vitals Group     BP (!) 168/109     Pulse Rate (!) 102     Resp 20     Temp 98.7 F (37.1 C)     Temp Source Oral     SpO2 90 %     Weight 155 lb (70.3 kg)     Height '5\' 4"'$  (1.626 m)     Head Circumference      Peak Flow      Pain Score 8     Pain Loc      Pain Edu?      Excl. in [place]?  CONSTITUTIONAL: Alert and oriented and responds appropriately to questions.  Chronically ill-appearing.  Poor historian. HEAD: Normocephalic EYES: Conjunctivae clear, pupils appear equal, EOM appear intact ENT: normal nose; moist mucous membranes NECK: Supple, normal ROM CARD: Regular and tachycardic; S1 and S2 appreciated; no murmurs, no clicks, no rubs, no gallops RESP: Normal chest excursion without splinting or tachypnea; breath sounds clear and equal bilaterally; no wheezes, no rhonchi, no rales, sats 90% on room air at rest, speaking full sentences ABD/GI: Normal bowel sounds; non-distended; soft, diffusely tender to palpation without guarding or rebound, no peritoneal signs BACK: The back appears normal EXT: Normal ROM in all joints; no deformity noted, no edema; no cyanosis; fistula in the left upper extremity with normal thrill without redness, warmth, discomfort, bleeding or drainage SKIN: Normal color for age and race; warm; no rash on exposed skin NEURO: Moves all extremities equally PSYCH: The patient's mood and manner are appropriate.  ____________________________________________   LABS (all labs ordered are listed, but only  abnormal results are displayed)  Labs Reviewed  COMPREHENSIVE METABOLIC PANEL - Abnormal; Notable for the following components:      Result Value   Chloride 95 (*)    BUN 42 (*)    Creatinine, Ser 10.79 (*)    Total Protein 8.4 (*)    Alkaline Phosphatase 156 (*)    Total Bilirubin 1.5 (*)    GFR, Estimated 5 (*)    Anion gap 19 (*)    All other components within normal limits  CBC - Abnormal; Notable for the following components:   RBC 3.54 (*)    Hemoglobin 10.9 (*)    HCT 33.7 (*)    Platelets 126 (*)    All other components within normal limits  URINALYSIS, ROUTINE W REFLEX MICROSCOPIC - Abnormal; Notable for the following components:   Color, Urine YELLOW (*)    APPearance CLEAR (*)    pH 9.0 (*)    Glucose, UA 150 (*)    Protein, ur >=300 (*)    Bacteria, UA RARE (*)    All other components within normal limits  TROPONIN I (HIGH SENSITIVITY) - Abnormal; Notable for the following components:   Troponin I (High Sensitivity) 62 (*)    All other components within normal limits  TROPONIN I (HIGH SENSITIVITY) - Abnormal; Notable for the following components:   Troponin I (High Sensitivity) 64 (*)    All other components within normal limits  RESP PANEL BY RT-PCR (FLU A&B, COVID) [id number]  URINE CULTURE  CULTURE, BLOOD (ROUTINE X 2)  CULTURE, BLOOD (ROUTINE X 2)  LIPASE, BLOOD  ETHANOL   ____________________________________________  EKG   EKG Interpretation  Date/Time:  Sunday January 13 2021 01:14:58 EDT Ventricular Rate:  108 PR Interval:  124 QRS Duration: 96 QT Interval:  394 QTC Calculation: 529 R Axis:   74 Text Interpretation: Sinus tachycardia RSR' in V1 or V2, probably normal variant Prolonged QT interval Confirmed by Pryor Curia [id number]) on 01/13/2021 2:18:09 AM       ____________________________________________  RADIOLOGY Jessie Foot Bobby Barton, personally viewed and evaluated these images (plain radiographs) as part of my medical decision making, as well  as reviewing the written report by the radiologist.  ED MD interpretation: Chest x-ray shows multifocal pneumonia.  CT of the abdomen pelvis shows ascites.  Official radiology report(s): DG Chest 2 View  Result Date: 01/13/2021 CLINICAL DATA:  Shortness of breath. Lightheaded. Dizzy, nausea, vomiting, diarrhea. EXAM: CHEST - 2 VIEW COMPARISON:  10/09/2020 FINDINGS:  Shallow inspiration. Cardiac enlargement. Patchy infiltrates and peribronchial thickening in the lungs with perihilar distribution. Changes suggest multifocal pneumonia. No pleural effusions. No pneumothorax. Mediastinal contours appear intact. Calcification of the aorta. IMPRESSION: Cardiac enlargement. Patchy perihilar infiltrates and peribronchial thickening suggesting multifocal pneumonia. Electronically Signed   By: Lucienne Capers M.D.   On: 01/13/2021 00:49   CT ABDOMEN PELVIS W CONTRAST  Result Date: 01/13/2021 CLINICAL DATA:  Acute diffuse abdominal pain. Lightheaded and dizzy. Nausea, vomiting, and diarrhea. EXAM: CT ABDOMEN AND PELVIS WITH CONTRAST TECHNIQUE: Multidetector CT imaging of the abdomen and pelvis was performed using the standard protocol following bolus administration of intravenous contrast. CONTRAST:  10m OMNIPAQUE IOHEXOL 300 MG/ML  SOLN COMPARISON:  07/22/2019 FINDINGS: Lower chest: Motion artifact limits examination. Lung bases appear grossly clear. Cardiac enlargement. Hepatobiliary: No focal liver lesions. Gallbladder wall thickening and edema likely related to ascites. No gallstones. No bile duct dilatation. Pancreas: Unremarkable. No pancreatic ductal dilatation or surrounding inflammatory changes. Spleen: Normal in size without focal abnormality. Adrenals/Urinary Tract: No adrenal gland nodules. Prominent bilateral renal parenchymal atrophy. Intrarenal vascular calcifications. No hydronephrosis or hydroureter. Bladder wall is diffusely thickened which may be due to outlet obstruction or cystitis.  Stomach/Bowel: The stomach, small bowel, and colon are not abnormally distended. Stool fills the colon. No obvious inflammatory changes are demonstrated. Appendix is not identified. Vascular/Lymphatic: Diffuse aortic and branch vessel calcifications. 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 Calcification and thrombus of the origin of the right iliac artery, similar to prior study. No significant lymphadenopathy. Reproductive: Prostate gland is enlarged. Other: Moderate free fluid throughout the abdomen and pelvis, likely ascites. No free air. 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 IMPRESSION: 1. Moderate free fluid throughout the abdomen and pelvis, likely ascites. 2. Cardiac enlargement. 3. Prominent bilateral renal parenchymal atrophy. 4. Bladder wall thickening may be due to outlet obstruction or cystitis. 5. Prominent aortic and abdominal arterial atherosclerosis. 6. 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 Aortic Atherosclerosis (ICD10-I70.0). Electronically Signed   By: WLucienne CapersM.D.   On: 01/13/2021 01:27    ____________________________________________   PROCEDURES  Procedure(s) performed (including Critical Care):  Procedures    ____________________________________________   INITIAL IMPRESSION / ASSESSMENT AND PLAN / ED COURSE  As part of my medical decision making, I reviewed the following data within the [facility] [place]notes reviewed and incorporated, Labs reviewed , EKG interpreted , Old EKG reviewed, Old chart reviewed, Radiograph reviewed  and Notes from prior ED visits         Patient here with complaints of abdominal pain, nausea, vomiting and diarrhea.  Differential includes viral gastroenteritis, colitis, diverticulitis, appendicitis, cholecystitis, pancreatitis, UTI, pyelonephritis, kidney stone.  Also  reports feeling short of breath.  States his dialysis was cut short on Friday but he did not miss any other dialysis treatments.  Does not appear volume overloaded today.  Room air sats were 90%.  Placed on 2 L.  Will obtain chest x-ray.  Low suspicion that this is his anginal equivalent but will obtain EKG, troponin.  Will give pain and nausea medicine.  Will obtain labs, CT of the abdomen pelvis.  ED PROGRESS  Patient's work-up shows multifocal pneumonia.  COVID and flu negative.  Will cover for HCAP with vancomycin and cefepime.  Blood cultures pending.  CT of the abdomen pelvis shows ascites.  Doubt SBP based on exam.  Denies history of previous paracentesis.  He  also has bladder wall thickening which may be due to outlet obstruction versus cystitis.  Urinalysis obtained shows no sign of infection.  Troponins minimally elevated but flat.  This is in the setting of chronic kidney disease.  Doubt ACS.  Taken off of oxygen and satting 97% on room air currently.  Will discuss with hospitalist for admission.   3:30 AM  Discussed patient's case with hospitalist, Dr. Damita Dunnings.  I have recommended admission and patient (and family if present) agree with this plan. Admitting physician will place admission orders.   I reviewed all nursing notes, vitals, pertinent previous records and reviewed/interpreted all EKGs, lab and urine results, imaging (as available).   ____________________________________________   FINAL CLINICAL IMPRESSION(S) / ED DIAGNOSES  Final diagnoses:  Multifocal pneumonia  Other ascites     ED Discharge Orders    None      *Please note:  TAJEE HUTH was evaluated in Emergency Department on 01/13/2021 for the symptoms described in the history of present illness. He was evaluated in the context of the global COVID-19 pandemic, which necessitated consideration that the patient might be at risk for infection with the SARS-CoV-2 virus that causes COVID-19. Institutional  protocols and algorithms that pertain to the evaluation of patients at risk for COVID-19 are in a state of rapid change based on information released by regulatory bodies including the CDC and federal and state organizations. These policies and algorithms were followed during the patient's care in the ED.  Some ED evaluations and interventions may be delayed as a result of limited staffing during and the pandemic.*   Note:  This document was prepared using Dragon voice recognition software and may include unintentional dictation errors.   Tylik Treese, Delice Bison, DO 01/13/21 [phone]

## 2021-01-13 NOTE — Plan of Care (Signed)
Continuing with plan of care. 

## 2021-01-13 NOTE — H&P (Signed)
History and Physical    FRIEND VANDERPOEL [id number] DOB: 02-20-1966 DOA: 01/12/2021  PCP: Pcp, No   Patient coming from: Home  I have personally briefly reviewed patient's old medical records in [place]  Chief Complaint: Generalized malaise, fever and chills  HPI: Jordan [name] is a 55 y.o. male with medical history significant for ESRD on HD MWF, HTN, A. fib and COPD who presents to the emergency room with a 1 day history of generalized malaise," feeling jittery", chills and fever associated with mild shortness of breath.  Also has a nonproductive cough.  Has not missed any dialysis sessions and no sick contacts.  Denies chest pain.  Also has mild abdominal pain and states he had an episode of vomiting and diarrhea.   ED Course: On arrival, BP 168/109, pulse 102, temp 98.7, O2 sat 90% on room air.  Blood work with WBC 9.4, hemoglobin 10.9, lipase 45, troponin was 62/64, EtOH less than 10, urinalysis unremarkable EKG as reviewed by me : Sinus rhythm at 108 with no acute ST-T wave changes Imaging: Chest x-ray with patchy perihilar infiltrates and peribronchial thickening suggesting multifocal pneumonia CT abdomen and pelvis with finding of ascites  Review of Systems: As per HPI otherwise all other systems on review of systems negative.    Past Medical History:  Diagnosis Date  . Anemia   . Aortic atherosclerosis ([place]) 11/12/2019  . CKD (chronic kidney disease)    Stage 5  Dialysis - M/W/F in [place], [place]  . Dyspnea    tx with inhaler when sick  . ED (erectile dysfunction)   . Emphysema of lung ([place]) 11/12/2019  . ETOH abuse   . History of blood transfusion   . Hypertension   . Wears dentures     Past Surgical History:  Procedure Laterality Date  . BASCILIC VEIN TRANSPOSITION Left 08/07/2016   Procedure: LEFT BASILIC VEIN TRANSPOSITION;  Surgeon: Angelia Mould, MD;  Location: [facility];  Service: Vascular;  Laterality: Left;  . CLOSED REDUCTION NASAL  FRACTURE N/A 08/11/2019   Procedure: CLOSED REDUCTION NASAL FRACTURE WITH STABILIZATION;  Surgeon: Irene Limbo, MD;  Location: [facility];  Service: Plastics;  Laterality: N/A;  . COLONOSCOPY N/A 08/12/2016   Procedure: COLONOSCOPY;  Surgeon: Otis Brace, MD;  Location: [facility];  Service: Gastroenterology;  Laterality: N/A;  . ESOPHAGOGASTRODUODENOSCOPY N/A 08/12/2016   Procedure: ESOPHAGOGASTRODUODENOSCOPY (EGD);  Surgeon: Otis Brace, MD;  Location: [facility];  Service: Gastroenterology;  Laterality: N/A;  . INSERTION OF DIALYSIS CATHETER N/A 08/07/2016   Procedure: INSERTION OF TUNNELED DIALYSIS CATHETER;  Surgeon: Angelia Mould, MD;  Location: [facility];  Service: Vascular;  Laterality: N/A;  . LIGATION OF ARTERIOVENOUS  FISTULA Left 09/12/2016   Procedure: BANDING OF LEFT  ARTERIOVENOUS  FISTULA;  Surgeon: Angelia Mould, MD;  Location: [facility];  Service: Vascular;  Laterality: Left;  . LOWER EXTREMITY INTERVENTION Right 12/02/2018   Procedure: LOWER EXTREMITY INTERVENTION;  Surgeon: Algernon Huxley, MD;  Location: [facility];  Service: Cardiovascular;  Laterality: Right;     reports that he has been smoking cigarettes. He has a 20.00 pack-year smoking history. He has never used smokeless tobacco. He reports current alcohol use of about 21.0 standard drinks of alcohol per week. He reports that he does not use drugs.  No Known Allergies  Family History  Problem Relation Age of Onset  . Diabetes Mother   . Kidney failure Mother   . Healthy Father   .  Kidney failure Brother   . Healthy Sister   . Kidney disease Daughter   . Post-traumatic stress disorder Neg Hx   . Bladder Cancer Neg Hx   . Kidney cancer Neg Hx       Prior to Admission medications   Medication Sig Start Date End Date Taking? Authorizing Provider  AURYXIA 1 GM 210 MG(Fe) tablet Take 420 mg by mouth 3 (three) times daily. 11/13/20  Yes [provider]  cinacalcet (SENSIPAR)  90 MG tablet Take 1 tablet by mouth every evening. 11/28/20  Yes [provider]  doxycycline (VIBRA-TABS) 100 MG tablet Take 100 mg by mouth 2 (two) times daily. 01/10/21  Yes [provider]  hydrALAZINE (APRESOLINE) 50 MG tablet Take 50 mg by mouth. 08/09/19  Yes [provider]  hydrOXYzine (ATARAX/VISTARIL) 10 MG tablet Take 10 mg by mouth 3 (three) times daily as needed. 01/10/21  Yes [provider]  patiromer Daryll Drown) 8.4 g packet Take by mouth. 03/13/20  Yes [provider]  SARNA lotion Apply topically. 08/01/20  Yes [provider]  sildenafil (VIAGRA) 50 MG tablet Take by mouth. 07/20/19  Yes [provider]  triamcinolone cream (KENALOG) 0.1 % SMARTSIG:1 Application Topical 2-3 Times Daily 12/28/20  Yes [provider]  calcium acetate (PHOSLO) 667 MG capsule Take 2 capsules (1,334 mg total) by mouth 3 (three) times daily with meals. Patient not taking: No sig reported 02/12/19   Fritzi Mandes, MD  cephALEXin (KEFLEX) 500 MG capsule Take 1 capsule (500 mg total) by mouth 2 (two) times daily. Patient not taking: No sig reported 10/10/20   Veryl Speak, MD  epoetin alfa (EPOGEN) 4000 UNIT/ML injection Inject 1 mL (4,000 Units total) into the vein every Monday, Wednesday, and Friday with hemodialysis. Patient not taking: No sig reported 06/22/20   Enzo Bi, MD  gabapentin (NEURONTIN) 100 MG capsule Take 1 capsule (100 mg total) by mouth at bedtime. Patient not taking: No sig reported 08/21/20   Jearld Fenton, NP  hydrOXYzine (ATARAX/VISTARIL) 25 MG tablet Take 1 tablet (25 mg total) by mouth every 6 (six) hours. Patient not taking: No sig reported 10/10/20   Veryl Speak, MD  lisinopril (ZESTRIL) 10 MG tablet Take 1 tablet (10 mg total) by mouth daily. Blood pressure medication. 06/20/20 09/18/20  Enzo Bi, MD  OLANZapine (ZYPREXA) 2.5 MG tablet Take 1 tablet (2.5 mg total) by mouth at bedtime. Patient not taking: No sig  reported 04/10/20   Candee Furbish, MD  predniSONE (DELTASONE) 10 MG tablet Take 2 tablets (20 mg total) by mouth 2 (two) times daily with a meal. Patient not taking: No sig reported 10/10/20   Veryl Speak, MD  sevelamer carbonate (RENVELA) 800 MG tablet Take 800 mg by mouth 3 (three) times daily with meals. Patient not taking: No sig reported    [provider]  thiamine 100 MG tablet Take 1 tablet (100 mg total) by mouth daily. Patient not taking: No sig reported 06/20/20   Enzo Bi, MD    Physical Exam: Vitals:   01/13/21 0230 01/13/21 0300 01/13/21 0345 01/13/21 0407  BP: (!) 142/78   (!) 147/77  Pulse: (!) 108   (!) 105  Resp: 18 (!) '26 15 16  '$ Temp:    98.7 F (37.1 C)  TempSrc:    Rectal  SpO2: 96%   97%  Weight:      Height:         Vitals:   01/13/21 0230  01/13/21 0300 01/13/21 0345 01/13/21 0407  BP: (!) 142/78   (!) 147/77  Pulse: (!) 108   (!) 105  Resp: 18 (!) '26 15 16  '$ Temp:    98.7 F (37.1 C)  TempSrc:    Rectal  SpO2: 96%   97%  Weight:      Height:          Constitutional: Alert and oriented x 3 . Not in any apparent distress HEENT:      Head: Normocephalic and atraumatic.         Eyes: PERLA, EOMI, Conjunctivae are normal. Sclera is non-icteric.       Mouth/Throat: Mucous membranes are moist.       Neck: Supple with no signs of meningismus. Cardiovascular:  Tachycardia. No murmurs, gallops, or rubs. 2+ symmetrical distal pulses are present . No JVD. No LE edema Respiratory: Respiratory effort normal .Lungs sounds diminished bilaterally. No wheezes, crackles, or rhonchi.  Gastrointestinal: Soft, mild diffuse tenderness, mildly distended with positive bowel sounds.  Genitourinary: No CVA tenderness. Musculoskeletal: Nontender with normal range of motion in all extremities. No cyanosis, or erythema of extremities. Neurologic:  Face is symmetric. Moving all extremities. No gross focal neurologic deficits . Skin: Skin is warm, dry.  No rash or  ulcers Psychiatric: Mood and affect are normal    Labs on Admission: I have personally reviewed following labs and imaging studies  CBC: Recent Labs  Lab 01/13/21 0002  WBC 9.4  HGB 10.9*  HCT 33.7*  MCV 95.2  PLT 123XX123*   Basic Metabolic Panel: Recent Labs  Lab 01/13/21 0002  NA 140  K 4.7  CL 95*  CO2 26  GLUCOSE 91  BUN 42*  CREATININE 10.79*  CALCIUM 9.2   GFR: Estimated Creatinine Clearance: 6.6 mL/min (A) (by C-G formula based on SCr of 10.79 mg/dL (H)). Liver Function Tests: Recent Labs  Lab 01/13/21 0002  AST 15  ALT 10  ALKPHOS 156*  BILITOT 1.5*  PROT 8.4*  ALBUMIN 4.0   Recent Labs  Lab 01/13/21 0002  LIPASE 45   No results for input(s): AMMONIA in the last 168 hours. Coagulation Profile: No results for input(s): INR, PROTIME in the last 168 hours. Cardiac Enzymes: No results for input(s): CKTOTAL, CKMB, CKMBINDEX, TROPONINI in the last 168 hours. BNP (last 3 results) No results for input(s): PROBNP in the last 8760 hours. HbA1C: No results for input(s): HGBA1C in the last 72 hours. CBG: No results for input(s): GLUCAP in the last 168 hours. Lipid Profile: No results for input(s): CHOL, HDL, LDLCALC, TRIG, CHOLHDL, LDLDIRECT in the last 72 hours. Thyroid Function Tests: No results for input(s): TSH, T4TOTAL, FREET4, T3FREE, THYROIDAB in the last 72 hours. Anemia Panel: No results for input(s): VITAMINB12, FOLATE, FERRITIN, TIBC, IRON, RETICCTPCT in the last 72 hours. Urine analysis:    Component Value Date/Time   COLORURINE YELLOW (A) 01/13/2021 0153   APPEARANCEUR CLEAR (A) 01/13/2021 0153   LABSPEC 1.012 01/13/2021 0153   PHURINE 9.0 (H) 01/13/2021 0153   GLUCOSEU 150 (A) 01/13/2021 0153   HGBUR NEGATIVE 01/13/2021 0153   BILIRUBINUR NEGATIVE 01/13/2021 0153   KETONESUR NEGATIVE 01/13/2021 0153   PROTEINUR >=300 (A) 01/13/2021 0153   NITRITE NEGATIVE 01/13/2021 0153   LEUKOCYTESUR NEGATIVE 01/13/2021 0153    Radiological Exams  on Admission: DG Chest 2 View  Result Date: 01/13/2021 CLINICAL DATA:  Shortness of breath. Lightheaded. Dizzy, nausea, vomiting, diarrhea. EXAM: CHEST - 2 VIEW COMPARISON:  10/09/2020 FINDINGS: Shallow  inspiration. Cardiac enlargement. Patchy infiltrates and peribronchial thickening in the lungs with perihilar distribution. Changes suggest multifocal pneumonia. No pleural effusions. No pneumothorax. Mediastinal contours appear intact. Calcification of the aorta. IMPRESSION: Cardiac enlargement. Patchy perihilar infiltrates and peribronchial thickening suggesting multifocal pneumonia. Electronically Signed   By: Lucienne Capers M.D.   On: 01/13/2021 00:49   CT ABDOMEN PELVIS W CONTRAST  Result Date: 01/13/2021 CLINICAL DATA:  Acute diffuse abdominal pain. Lightheaded and dizzy. Nausea, vomiting, and diarrhea. EXAM: CT ABDOMEN AND PELVIS WITH CONTRAST TECHNIQUE: Multidetector CT imaging of the abdomen and pelvis was performed using the standard protocol following bolus administration of intravenous contrast. CONTRAST:  63m OMNIPAQUE IOHEXOL 300 MG/ML  SOLN COMPARISON:  07/22/2019 FINDINGS: Lower chest: Motion artifact limits examination. Lung bases appear grossly clear. Cardiac enlargement. Hepatobiliary: No focal liver lesions. Gallbladder wall thickening and edema likely related to ascites. No gallstones. No bile duct dilatation. Pancreas: Unremarkable. No pancreatic ductal dilatation or surrounding inflammatory changes. Spleen: Normal in size without focal abnormality. Adrenals/Urinary Tract: No adrenal gland nodules. Prominent bilateral renal parenchymal atrophy. Intrarenal vascular calcifications. No hydronephrosis or hydroureter. Bladder wall is diffusely thickened which may be due to outlet obstruction or cystitis. Stomach/Bowel: The stomach, small bowel, and colon are not abnormally distended. Stool fills the colon. No obvious inflammatory changes are demonstrated. Appendix is not identified.  Vascular/Lymphatic: Diffuse aortic and branch vessel calcifications. 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 Calcification and thrombus of the origin of the right iliac artery, similar to prior study. No significant lymphadenopathy. Reproductive: Prostate gland is enlarged. Other: Moderate free fluid throughout the abdomen and pelvis, likely ascites. No free air. 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 IMPRESSION: 1. Moderate free fluid throughout the abdomen and pelvis, likely ascites. 2. Cardiac enlargement. 3. Prominent bilateral renal parenchymal atrophy. 4. Bladder wall thickening may be due to outlet obstruction or cystitis. 5. Prominent aortic and abdominal arterial atherosclerosis. 6. 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 Aortic Atherosclerosis (ICD10-I70.0). Electronically Signed   By: WLucienne CapersM.D.   On: 01/13/2021 01:27     Assessment/Plan    Pneumonia - Patient with mild cough, subjective fever or chills, normal WBC but chest x-ray showing findings consistent with multifocal pneumonia - COVID and flu test negative -IV Rocephin and azithromycin - Follow-up procalcitonin - Follow cultures  Possible gastroenteritis - Patient with mild abdominal pain, vomiting and diarrhea - Ascites seen on CT abdomen and pelvis without other acute findings - Supportive care    ESRD on dialysis ([facility] - Nephrology consult for continuation of dialysis    Chronic combined systolic (congestive) and diastolic (congestive) heart failure ([place] - Appears euvolemic - Continue home meds    Essential hypertension - Continue home meds    Anemia in ESRD (end-stage renal disease) ([place] - At baseline    DVT prophylaxis: Heparin Code Status: full code  Family Communication:  none  Disposition Plan: Back to previous home environment Consults called:  Renal Status:At the time of admission, it appears that the appropriate admission status for this patient is INPATIENT. This is judged to be reasonable and necessary in order to provide the required intensity of service to ensure the patient's safety given the presenting symptoms, physical exam findings, and initial radiographic and laboratory data in the context of their  Comorbid conditions.   Patient requires inpatient status due to high intensity of service, high risk for further deterioration and high frequency of surveillance  required.   I certify that at the point of admission it is my clinical judgment that the patient will require inpatient hospital care spanning beyond [facility] MD [facility]     01/13/2021, 4:27 AM

## 2021-01-13 NOTE — Progress Notes (Signed)
Jordan [name]  MRN: [id number]  DOB/AGE: 1965/10/28 55 y.o.  Primary Care Physician:Pcp, No  Admit date: 01/12/2021  Chief Complaint:  Chief Complaint  Patient presents with  . Abdominal Pain  . Emesis  . Diarrhea    S-Pt presented on  01/12/2021 with  Chief Complaint  Patient presents with  . Abdominal Pain  . Emesis  . Diarrhea  .  Patient is known to our practice from earlier admission In 2021.   Patient is a 55 year old African-American male with a past medical history of ESRD, patient is a Monday Wednesday Friday schedule, hypertension, atrial fibrillation and COPD who came to the ER with chief complaint of malaise, chills and fever. Upon evaluation in the ER patient had chest x-ray done which showed patient had patchy perihilar infiltrates.  Patient was admitted with multifocal pneumonia.   Patient was seen today on second floor Patient offers no new specific physical complaints. Patient states he is feeling better than before No complaint of chest pain/shortness of breath   Medications . heparin  5,000 Units Subcutaneous Q8H  . senna-docusate  1 tablet Oral BID         [id number] from the symptoms mentioned above,there are no other symptoms referable to all systems reviewed.  Physical Exam: Vital signs in last 24 hours: Temp:  [97.5 F (36.4 C)-98.7 F (37.1 C)] 97.5 F (36.4 C) (04/24 0812) Pulse Rate:  [88-113] 91 (04/24 1245) Resp:  [15-26] 18 (04/24 1245) BP: (137-168)/(77-109) 153/95 (04/24 1245) SpO2:  [90 %-100 %] 96 % (04/24 1245) FiO2 (%):  [2 %] 2 % (04/24 0005) Weight:  [70.3 kg] 70.3 kg (04/23 2358) Weight change:  Last BM Date: 01/11/21  Intake/Output from previous day: 04/23 0701 - 04/24 0700 In: 432.7 [IV Piggyback:432.7] Out: -  Total I/O In: 640 [P.O.:240; IV Piggyback:400] Out: -    Physical Exam:  General- pt is awake,alert, oriented to time place and person  Resp- No acute REsp distress, Rhonchi present  CVS-  S1S2 regular in rate and rhythm  GIT- BS+, soft, Non tender , Non distended  EXT- No LE Edema,  No Cyanosis  Access- LUE AVF  Lab Results:  CBC  Recent Labs    01/13/21 0002 01/13/21 0511  WBC 9.4 8.7  HGB 10.9* 10.3*  HCT 33.7* 32.0*  PLT 126* 115*    BMET  Recent Labs    01/13/21 0002 01/13/21 0511  NA 140  --   K 4.7  --   CL 95*  --   CO2 26  --   GLUCOSE 91  --   BUN 42*  --   CREATININE 10.79* 10.98*  CALCIUM 9.2  --       Most recent Creatinine trend  Lab Results  Component Value Date   CREATININE 10.98 (H) 01/13/2021   CREATININE 10.79 (H) 01/13/2021   CREATININE 17.70 (H) 10/09/2020      MICRO   Recent Results (from the past 240 hour(s))  Culture, blood (Routine X 2) w Reflex to ID Panel     Status: None (Preliminary result)   Collection Time: 01/13/21  1:54 AM   Specimen: BLOOD  Result Value Ref Range Status   Specimen Description BLOOD  RIGHT ARM  Final   Special Requests   Final    BOTTLES DRAWN AEROBIC AND ANAEROBIC Blood Culture adequate volume   Culture   Final    NO GROWTH < 12 HOURS Performed at [facility], [address],  [place], [place] [address]    Report Status PENDING  Incomplete  Resp Panel by RT-PCR (Flu A&B, Covid)     Status: None   Collection Time: 01/13/21  1:56 AM   Specimen: Nasopharyngeal(NP) swabs in vial transport medium  Result Value Ref Range Status   SARS Coronavirus 2 by RT PCR NEGATIVE NEGATIVE Final    Comment: (NOTE) SARS-CoV-2 target nucleic acids are NOT DETECTED.  The SARS-CoV-2 RNA is generally detectable in upper respiratory specimens during the acute phase of infection. The lowest concentration of SARS-CoV-2 viral copies this assay can detect is 138 copies/mL. A negative result does not preclude SARS-Cov-2 infection and should not be used as the sole basis for treatment or other patient management decisions. A negative result may occur with  improper specimen collection/handling,  submission of specimen other than nasopharyngeal swab, presence of viral mutation(s) within the areas targeted by this assay, and inadequate number of viral copies(<138 copies/mL). A negative result must be combined with clinical observations, patient history, and epidemiological information. The expected result is Negative.  Fact Sheet for Patients:  EntrepreneurPulse.com.au  Fact Sheet for Healthcare Providers:  IncredibleEmployment.be  This test is no t yet approved or cleared by the [place] FDA and  has been authorized for detection and/or diagnosis of SARS-CoV-2 by FDA under an Emergency Use Authorization (EUA). This EUA will remain  in effect (meaning this test can be used) for the duration of the COVID-19 declaration under Section 564(b)(1) of the Act, 21 U.S.C.section 360bbb-3(b)(1), unless the authorization is terminated  or revoked sooner.       Influenza A by PCR NEGATIVE NEGATIVE Final   Influenza B by PCR NEGATIVE NEGATIVE Final    Comment: (NOTE) The Xpert Xpress SARS-CoV-2/FLU/RSV plus assay is intended as an aid in the diagnosis of influenza from Nasopharyngeal swab specimens and should not be used as a sole basis for treatment. Nasal washings and aspirates are unacceptable for Xpert Xpress SARS-CoV-2/FLU/RSV testing.  Fact Sheet for Patients: EntrepreneurPulse.com.au  Fact Sheet for Healthcare Providers: IncredibleEmployment.be  This test is not yet approved or cleared by the [place] FDA and has been authorized for detection and/or diagnosis of SARS-CoV-2 by FDA under an Emergency Use Authorization (EUA). This EUA will remain in effect (meaning this test can be used) for the duration of the COVID-19 declaration under Section 564(b)(1) of the Act, 21 U.S.C. section 360bbb-3(b)(1), unless the authorization is terminated or revoked.  Performed at [facility], [address] [place], [place] [address]   Culture, blood (Routine X 2) w Reflex to ID Panel     Status: None (Preliminary result)   Collection Time: 01/13/21  2:46 AM   Specimen: BLOOD  Result Value Ref Range Status   Specimen Description BLOOD RIGHT ANTECUBITAL  Final   Special Requests   Final    BOTTLES DRAWN AEROBIC AND ANAEROBIC Blood Culture adequate volume   Culture   Final    NO GROWTH <12 HOURS Performed at [facility], [place], [place] [address]    Report Status PENDING  Incomplete         Impression:  Patient is a 55 year old freak American male with a past medical history of ESRD on hemodialysis, hypertension, defibrillation, COPD who is currently admitted with    Pneumonia   ESRD on dialysis [facility])   Essential hypertension   Anemia in ESRD (end-stage renal disease) ([place])   Chronic combined systolic (congestive) and diastolic (congestive) heart failure ([place])  Patient outpatient dialysis treatment as Monday Wednesday Friday schedule Optiflex 180 NR 4 hours treatment 67 kg dry weight 2K/2 calcium bath Left AV fistula 15-gauge needles [address]    1)Renal    ESRD Patient is a hemodialysis Patient is on Monday Wednesday Friday schedule Patient will be dialyzed tomorrow No acute indication of renal placement therapy today  2)HTN    Blood pressure is stable    3)Anemia of chronic disease  CBC Latest Ref Rng & Units 01/13/2021 01/13/2021 10/09/2020  WBC 4.0 - 10.5 K/uL 8.7 9.4 7.0  Hemoglobin 13.0 - 17.0 g/dL 10.3(L) 10.9(L) 9.1(L)  Hematocrit 39.0 - 52.0 % 32.0(L) 33.7(L) 28.8(L)  Platelets 150 - 400 K/uL 115(L) 126(L) 131(L)       HGb at goal (9--11)   4) Secondary hyperparathyroidism -CKD Mineral-Bone Disorder    Lab Results  Component Value Date   PTH 669 (H) 02/20/2020   CALCIUM 9.2 01/13/2021   CAION 1.26 04/07/2020   PHOS 5.1 (H) 06/20/2020    Secondary Hyperparathyroidism present Phosphorus at  goal.   5) chronic combined systolic and diastolic CHF Patient is well compensated  6) Electrolytes   BMP Latest Ref Rng & Units 01/13/2021 01/13/2021 10/09/2020  Glucose 70 - 99 mg/dL - 91 107(H)  BUN 6 - 20 mg/dL - 42(H) 84(H)  Creatinine 0.61 - 1.24 mg/dL 10.98(H) 10.79(H) 17.70(H)  Sodium 135 - 145 mmol/L - 140 142  Potassium 3.5 - 5.1 mmol/L - 4.7 5.1  Chloride 98 - 111 mmol/L - 95(L) 100  CO2 22 - 32 mmol/L - 26 21(L)  Calcium 8.9 - 10.3 mg/dL - 9.2 9.9     Sodium Normonatremic   Potassium Normokalemic    7)Acid base  Co2 at goal  8) multifocal pneumonia Patient is on ceftriaxone azithromycin Patient is clinically improving    Plan:  We will dialyze patient in the morning We will keep patient on Epogen       Jalaila Caradonna s Theador Hawthorne 01/13/2021, 3:06 PM

## 2021-01-13 NOTE — Progress Notes (Signed)
PROGRESS NOTE    KALADIN KOLTON  [id number] DOB: Sep 29, 1965 DOA: 01/12/2021 PCP: Pcp, No   Chief complaint.  Shortness of breath. Brief Narrative:  LEVONTAE REPASS is a 55 y.o. male with medical history significant for ESRD on HD MWF, HTN, A. fib and COPD who presents to the emergency room with a 1 day history of generalized malaise," feeling jittery", chills and fever associated with mild shortness of breath.  Also has a nonproductive cough.  Has not missed any dialysis sessions and no sick contacts.   Chest x-ray showed patchy PERI hilar infiltrates and peribronchial thickening suggest multifocal pneumonia.  CT abdomen/pelvis showed small amount of ascites.  Patient is placed on Rocephin and Zithromax for community-acquired pneumonia.   Assessment & Plan:   Principal Problem:   Pneumonia Active Problems:   ESRD on dialysis [facility])   Essential hypertension   Anemia in ESRD (end-stage renal disease) (HCC)   Chronic combined systolic (congestive) and diastolic (congestive) heart failure ([place])  #1.  Multifocal pneumonia. I will check a procalcitonin level, came back with 0.54, this can be explained by end-stage renal disease. Patient pneumonia could be viral pneumonia versus atypical pneumonia. COVID test negative. I will check a respiratory pathogen panel. Continue current antibiotics with Rocephin and Zithromax. Blood culture was also sent out. Patient potentially can be discharged home tomorrow.  2.  End-stage renal disease on dialysis. Anemia of chronic kidney disease. Chronic thrombocytopenia. Nephrology is aware. Reviewed previous labs, platelet count of 131 on 10/09/2020.  Currently stable.  3.  Chronic combined systolic and diastolic congestive heart failure. Chronic hypoxemic respiratory failure. Patient was on oxygen at 2 L as needed at home.  Currently he is on 2 L.  He does not seem to have any volume overload.  #4.  Loose stool. Patient normally is  constipated, had 1 loose stool yesterday.  Feel constipated again today.  I will start stool softener.    DVT prophylaxis: Heparin Code Status: Full Family Communication: Patient does not have any confusion, updated treatment plan, all questions answered. Disposition Plan:  .   Status is: Inpatient  Remains inpatient appropriate because:Inpatient level of care appropriate due to severity of illness   Dispo: The patient is from: Home              Anticipated d/c is to: Home              Patient currently is not medically stable to d/c.   Difficult to place patient No        I/O last 3 completed shifts: In: 432.7 [IV Piggyback:432.7] Out: -  No intake/output data recorded.     Consultants:   Nephrology  Procedures: None  Antimicrobials:  Rocephin Zithromax.  Subjective: Patient still has some short of breath, cough with white mucus.  On 2 L oxygen. He slept well last night, no paroxysmal nocturnal dyspnea. Denies any fever or chills. Intermittent nausea, no vomiting.  Feel constipated again.  Had loose stool before admission. No chest pain or palpitation. 's not making much urine.  Objective: Vitals:   01/13/21 0345 01/13/21 0407 01/13/21 0502 01/13/21 0812  BP:  (!) 147/77 (!) 137/98 (!) 146/88  Pulse:  (!) 105 99 88  Resp: '15 16 18 17  '$ Temp:  98.7 F (37.1 C) 98.5 F (36.9 C) (!) 97.5 F (36.4 C)  TempSrc:  Rectal Oral Oral  SpO2:  97% 97% 100%  Weight:      Height:  Intake/Output Summary (Last 24 hours) at 01/13/2021 0949 Last data filed at 01/13/2021 0556 Gross per 24 hour  Intake 432.67 ml  Output --  Net 432.67 ml   Filed Weights   01/12/21 2358  Weight: 70.3 kg    Examination:  General exam: Appears calm and comfortable  Respiratory system: Decreased breathing sounds. Respiratory effort normal. Cardiovascular system: S1 & S2 heard, RRR. No JVD, murmurs, rubs, gallops or clicks. No pedal edema. Gastrointestinal system: Abdomen  is nondistended, soft and nontender. No organomegaly or masses felt. Normal bowel sounds heard. Central nervous system: Alert and oriented x3. No focal neurological deficits. Extremities: Symmetric 5 x 5 power. Skin: No rashes, lesions or ulcers Psychiatry:  Mood & affect appropriate.     Data Reviewed: I have personally reviewed following labs and imaging studies  CBC: Recent Labs  Lab 01/13/21 0002 01/13/21 0511  WBC 9.4 8.7  HGB 10.9* 10.3*  HCT 33.7* 32.0*  MCV 95.2 95.8  PLT 126* [id number]*   Basic Metabolic Panel: Recent Labs  Lab 01/13/21 0002 01/13/21 0511  NA 140  --   K 4.7  --   CL 95*  --   CO2 26  --   GLUCOSE 91  --   BUN 42*  --   CREATININE 10.79* 10.98*  CALCIUM 9.2  --    GFR: Estimated Creatinine Clearance: 6.5 mL/min (A) (by C-G formula based on SCr of 10.98 mg/dL (H)). Liver Function Tests: Recent Labs  Lab 01/13/21 0002  AST 15  ALT 10  ALKPHOS 156*  BILITOT 1.5*  PROT 8.4*  ALBUMIN 4.0   Recent Labs  Lab 01/13/21 0002  LIPASE 45   No results for input(s): AMMONIA in the last 168 hours. Coagulation Profile: No results for input(s): INR, PROTIME in the last 168 hours. Cardiac Enzymes: No results for input(s): CKTOTAL, CKMB, CKMBINDEX, TROPONINI in the last 168 hours. BNP (last 3 results) No results for input(s): PROBNP in the last 8760 hours. HbA1C: No results for input(s): HGBA1C in the last 72 hours. CBG: No results for input(s): GLUCAP in the last 168 hours. Lipid Profile: No results for input(s): CHOL, HDL, LDLCALC, TRIG, CHOLHDL, LDLDIRECT in the last 72 hours. Thyroid Function Tests: No results for input(s): TSH, T4TOTAL, FREET4, T3FREE, THYROIDAB in the last 72 hours. Anemia Panel: No results for input(s): VITAMINB12, FOLATE, FERRITIN, TIBC, IRON, RETICCTPCT in the last 72 hours. Sepsis Labs: Recent Labs  Lab 01/13/21 0511  PROCALCITON 0.54    Recent Results (from the past 240 hour(s))  Culture, blood (Routine X 2) w  Reflex to ID Panel     Status: None (Preliminary result)   Collection Time: 01/13/21  1:54 AM   Specimen: BLOOD  Result Value Ref Range Status   Specimen Description BLOOD  RIGHT ARM  Final   Special Requests   Final    BOTTLES DRAWN AEROBIC AND ANAEROBIC Blood Culture adequate volume   Culture   Final    NO GROWTH < 12 HOURS Performed at [facility], [address], [place], [place] [address]    Report Status PENDING  Incomplete  Resp Panel by RT-PCR (Flu A&B, Covid)     Status: None   Collection Time: 01/13/21  1:56 AM   Specimen: Nasopharyngeal(NP) swabs in vial transport medium  Result Value Ref Range Status   SARS Coronavirus 2 by RT PCR NEGATIVE NEGATIVE Final    Comment: (NOTE) SARS-CoV-2 target nucleic acids are NOT DETECTED.  The SARS-CoV-2 RNA is  generally detectable in upper respiratory specimens during the acute phase of infection. The lowest concentration of SARS-CoV-2 viral copies this assay can detect is 138 copies/mL. A negative result does not preclude SARS-Cov-2 infection and should not be used as the sole basis for treatment or other patient management decisions. A negative result may occur with  improper specimen collection/handling, submission of specimen other than nasopharyngeal swab, presence of viral mutation(s) within the areas targeted by this assay, and inadequate number of viral copies(<138 copies/mL). A negative result must be combined with clinical observations, patient history, and epidemiological information. The expected result is Negative.  Fact Sheet for Patients:  EntrepreneurPulse.com.au  Fact Sheet for Healthcare Providers:  IncredibleEmployment.be  This test is no t yet approved or cleared by the [place] FDA and  has been authorized for detection and/or diagnosis of SARS-CoV-2 by FDA under an Emergency Use Authorization (EUA). This EUA will remain  in effect (meaning this test can be  used) for the duration of the COVID-19 declaration under Section 564(b)(1) of the Act, 21 U.S.C.section 360bbb-3(b)(1), unless the authorization is terminated  or revoked sooner.       Influenza A by PCR NEGATIVE NEGATIVE Final   Influenza B by PCR NEGATIVE NEGATIVE Final    Comment: (NOTE) The Xpert Xpress SARS-CoV-2/FLU/RSV plus assay is intended as an aid in the diagnosis of influenza from Nasopharyngeal swab specimens and should not be used as a sole basis for treatment. Nasal washings and aspirates are unacceptable for Xpert Xpress SARS-CoV-2/FLU/RSV testing.  Fact Sheet for Patients: EntrepreneurPulse.com.au  Fact Sheet for Healthcare Providers: IncredibleEmployment.be  This test is not yet approved or cleared by the [place] FDA and has been authorized for detection and/or diagnosis of SARS-CoV-2 by FDA under an Emergency Use Authorization (EUA). This EUA will remain in effect (meaning this test can be used) for the duration of the COVID-19 declaration under Section 564(b)(1) of the Act, 21 U.S.C. section 360bbb-3(b)(1), unless the authorization is terminated or revoked.  Performed at [facility], [address] [place], [place] [address]   Culture, blood (Routine X 2) w Reflex to ID Panel     Status: None (Preliminary result)   Collection Time: 01/13/21  2:46 AM   Specimen: BLOOD  Result Value Ref Range Status   Specimen Description BLOOD RIGHT ANTECUBITAL  Final   Special Requests   Final    BOTTLES DRAWN AEROBIC AND ANAEROBIC Blood Culture adequate volume   Culture   Final    NO GROWTH <12 HOURS Performed at [facility], [address] [place], [place] [address]    Report Status PENDING  Incomplete         Radiology Studies: DG Chest 2 View  Result Date: 01/13/2021 CLINICAL DATA:  Shortness of breath. Lightheaded. Dizzy, nausea, vomiting, diarrhea. EXAM: CHEST - 2 VIEW COMPARISON:   10/09/2020 FINDINGS: Shallow inspiration. Cardiac enlargement. Patchy infiltrates and peribronchial thickening in the lungs with perihilar distribution. Changes suggest multifocal pneumonia. No pleural effusions. No pneumothorax. Mediastinal contours appear intact. Calcification of the aorta. IMPRESSION: Cardiac enlargement. Patchy perihilar infiltrates and peribronchial thickening suggesting multifocal pneumonia. Electronically Signed   By: Lucienne Capers M.D.   On: 01/13/2021 00:49   CT ABDOMEN PELVIS W CONTRAST  Result Date: 01/13/2021 CLINICAL DATA:  Acute diffuse abdominal pain. Lightheaded and dizzy. Nausea, vomiting, and diarrhea. EXAM: CT ABDOMEN AND PELVIS WITH CONTRAST TECHNIQUE: Multidetector CT imaging of the abdomen and pelvis was performed using the standard protocol following bolus  administration of intravenous contrast. CONTRAST:  71m OMNIPAQUE IOHEXOL 300 MG/ML  SOLN COMPARISON:  07/22/2019 FINDINGS: Lower chest: Motion artifact limits examination. Lung bases appear grossly clear. Cardiac enlargement. Hepatobiliary: No focal liver lesions. Gallbladder wall thickening and edema likely related to ascites. No gallstones. No bile duct dilatation. Pancreas: Unremarkable. No pancreatic ductal dilatation or surrounding inflammatory changes. Spleen: Normal in size without focal abnormality. Adrenals/Urinary Tract: No adrenal gland nodules. Prominent bilateral renal parenchymal atrophy. Intrarenal vascular calcifications. No hydronephrosis or hydroureter. Bladder wall is diffusely thickened which may be due to outlet obstruction or cystitis. Stomach/Bowel: The stomach, small bowel, and colon are not abnormally distended. Stool fills the colon. No obvious inflammatory changes are demonstrated. Appendix is not identified. Vascular/Lymphatic: Diffuse aortic and branch vessel calcifications. 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  Calcification and thrombus of the origin of the right iliac artery, similar to prior study. No significant lymphadenopathy. Reproductive: Prostate gland is enlarged. Other: Moderate free fluid throughout the abdomen and pelvis, likely ascites. No free air. 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 IMPRESSION: 1. Moderate free fluid throughout the abdomen and pelvis, likely ascites. 2. Cardiac enlargement. 3. Prominent bilateral renal parenchymal atrophy. 4. Bladder wall thickening may be due to outlet obstruction or cystitis. 5. Prominent aortic and abdominal arterial atherosclerosis. 6. 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 Aortic Atherosclerosis (ICD10-I70.0). Electronically Signed   By: WLucienne CapersM.D.   On: 01/13/2021 01:27        Scheduled Meds: . heparin  5,000 Units Subcutaneous Q8H   Continuous Infusions: . azithromycin 500 mg (01/13/21 0842)  . cefTRIAXone (ROCEPHIN)  IV 2 g (01/13/21 0750)     LOS: 0 days    Time spent: no charge    DSharen Hones MD [facility]   To contact the attending provider between 7A-7P or the covering provider during after hours 7P-7A, please log into the web site [email or web address] and access using universal [place] password for that web site. If you do not have the password, please call the hospital operator.  01/13/2021, 9:49 AM

## 2021-01-13 NOTE — Progress Notes (Signed)
PHARMACY -  BRIEF ANTIBIOTIC NOTE   Pharmacy has received consult(s) for Cefepime and Vancomycin for HCAP (pt on dialysis) from an ED provider.  The patient's profile has been reviewed for ht/wt/allergies/indication/available labs.    One time order(s) placed for Cefepime 2 gm and Vancomycin 1750 based on pt wt of 70.3 kg.  Further antibiotics/pharmacy consults should be ordered by admitting physician if indicated.                       Renda Rolls, PharmD, [facility] 01/13/2021 2:14 AM

## 2021-01-13 NOTE — Plan of Care (Signed)

## 2021-01-13 NOTE — ED Notes (Signed)
Lab, [place], called for troponin add on

## 2021-01-14 ENCOUNTER — Inpatient Hospital Stay: Payer: Medicare Other

## 2021-01-14 DIAGNOSIS — I10 Essential (primary) hypertension: Secondary | ICD-10-CM

## 2021-01-14 DIAGNOSIS — Z992 Dependence on renal dialysis: Secondary | ICD-10-CM

## 2021-01-14 DIAGNOSIS — N186 End stage renal disease: Secondary | ICD-10-CM

## 2021-01-14 DIAGNOSIS — D631 Anemia in chronic kidney disease: Secondary | ICD-10-CM

## 2021-01-14 LAB — BASIC METABOLIC PANEL
Anion gap: 18 — ABNORMAL HIGH (ref 5–15)
BUN: 61 mg/dL — ABNORMAL HIGH (ref 6–20)
CO2: 24 mmol/L (ref 22–32)
Calcium: 8.9 mg/dL (ref 8.9–10.3)
Chloride: 93 mmol/L — ABNORMAL LOW (ref 98–111)
Creatinine, Ser: 12.22 mg/dL — ABNORMAL HIGH (ref 0.61–1.24)
GFR, Estimated: 4 mL/min — ABNORMAL LOW (ref >60)
Glucose, Bld: 78 mg/dL (ref 70–99)
Potassium: 6 mmol/L — ABNORMAL HIGH (ref 3.5–5.1)
Sodium: 135 mmol/L (ref 135–145)

## 2021-01-14 LAB — CBC WITH DIFFERENTIAL/PLATELET
Abs Immature Granulocytes: 0.04 10*3/uL (ref 0.00–0.07)
Basophils Absolute: 0 10*3/uL (ref 0.0–0.1)
Basophils Relative: 0 %
Eosinophils Absolute: 1 10*3/uL — ABNORMAL HIGH (ref 0.0–0.5)
Eosinophils Relative: 10 %
HCT: 31.9 % — ABNORMAL LOW (ref 39.0–52.0)
Hemoglobin: 10.2 g/dL — ABNORMAL LOW (ref 13.0–17.0)
Immature Granulocytes: 0 %
Lymphocytes Relative: 7 %
Lymphs Abs: 0.7 10*3/uL (ref 0.7–4.0)
MCH: 30.5 pg (ref 26.0–34.0)
MCHC: 32 g/dL (ref 30.0–36.0)
MCV: 95.5 fL (ref 80.0–100.0)
Monocytes Absolute: 0.8 10*3/uL (ref 0.1–1.0)
Monocytes Relative: 9 %
Neutro Abs: 7.1 10*3/uL (ref 1.7–7.7)
Neutrophils Relative %: 74 %
Platelets: 102 10*3/uL — ABNORMAL LOW (ref 150–400)
RBC: 3.34 MIL/uL — ABNORMAL LOW (ref 4.22–5.81)
RDW: 14 % (ref 11.5–15.5)
WBC: 9.8 10*3/uL (ref 4.0–10.5)
nRBC: 0 % (ref 0.0–0.2)

## 2021-01-14 LAB — URINE CULTURE: Culture: NO GROWTH

## 2021-01-14 LAB — GLUCOSE, CAPILLARY: Glucose-Capillary: 136 mg/dL — ABNORMAL HIGH (ref 70–99)

## 2021-01-14 IMAGING — CT CT NECK W/O CM
3 of 4 series · 13 of 35 positions shown, 16 images · non-contrast
Comparison: Neck CT [DATE]

CLINICAL DATA: Laryngeal edema. Additional history provided: Rapid
response called earlier today due to complaint of difficulty
swallowing, patient's tongue and eyes appears swollen, patient able
to speak in full sentences, able to manage secretions without
difficulty breathing. Patient was started on lisinopril recently
which was discontinued. Concern for angioedema.

EXAM:
CT NECK WITHOUT CONTRAST
TECHNIQUE: Multidetector CT imaging of the neck was performed following the
standard protocol without intravenous contrast.

[Series 2: axial neck · axial · 0.57mm/px · z∈[-293,-139]mm · 5 of 117 slices shown, 7 images]
[im 20/117  soft-tissue]
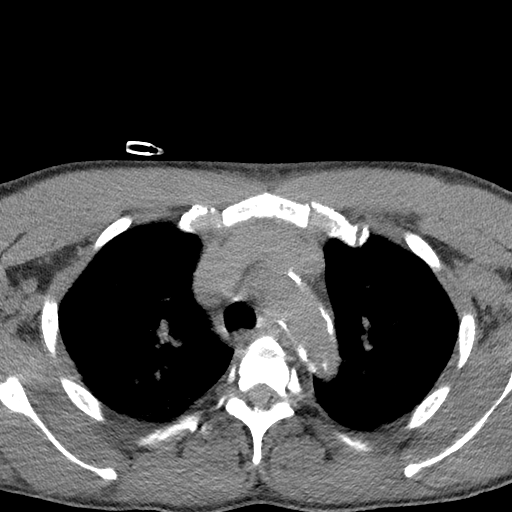
[im 20/117  bone]
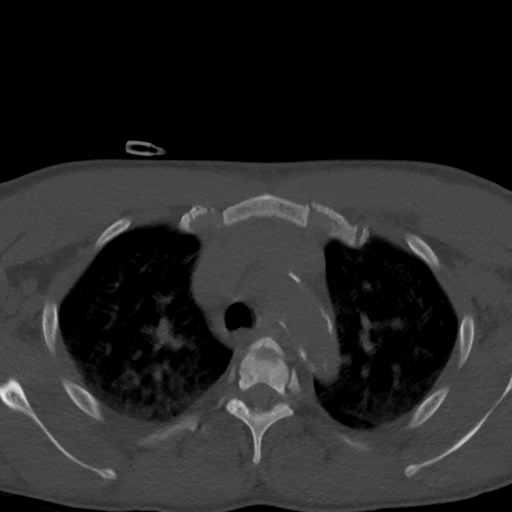
[im 39/117  bone]
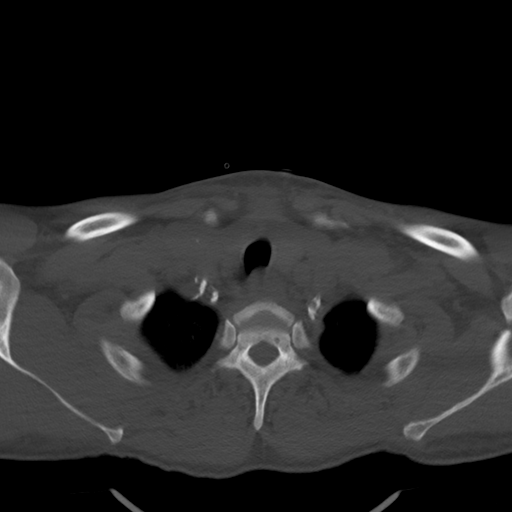
[im 59/117  bone]
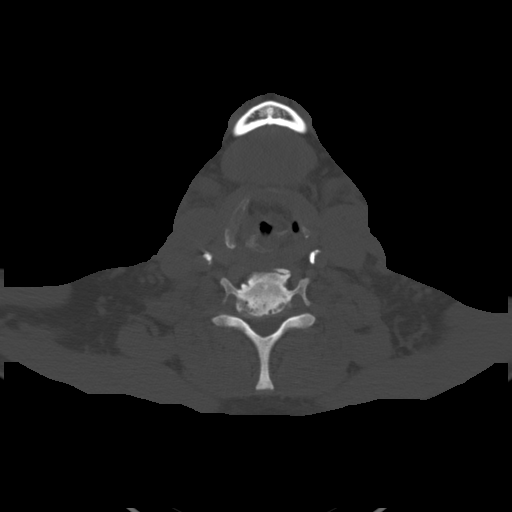
[im 78/117  bone]
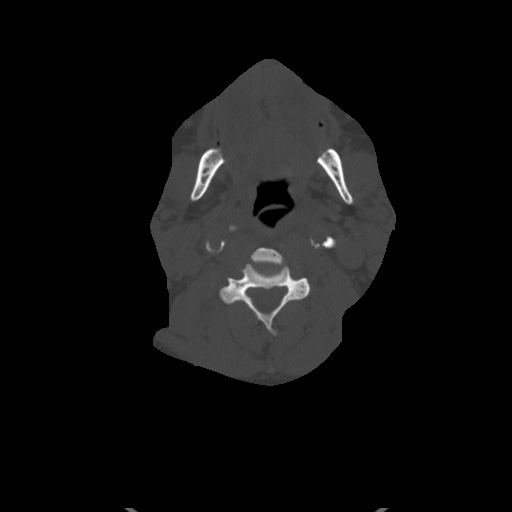
[im 97/117  soft-tissue]
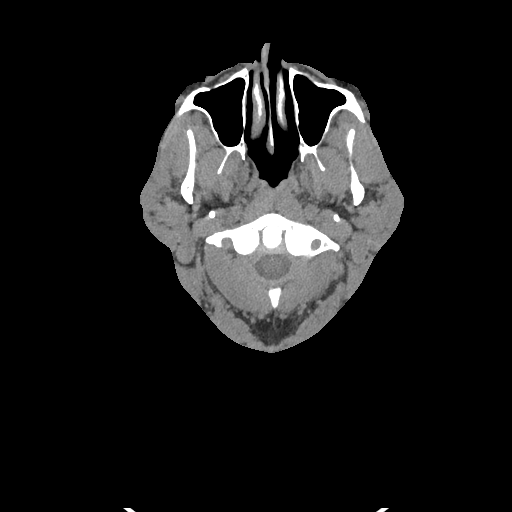
[im 97/117  bone]
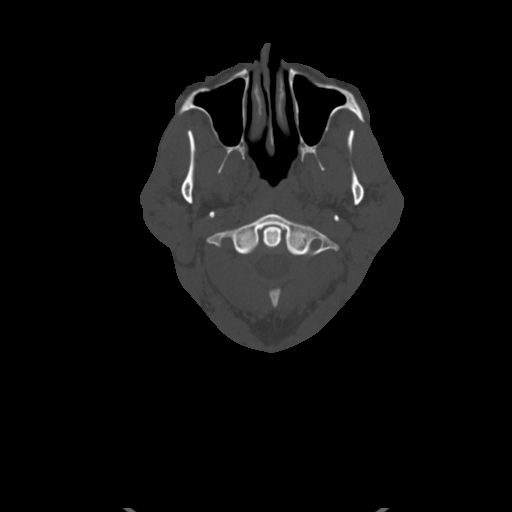

[Series 5: sag neck · sagittal · 0.50mm/px · 5 of 113 slices shown, 6 images]
[im 38/113  bone]
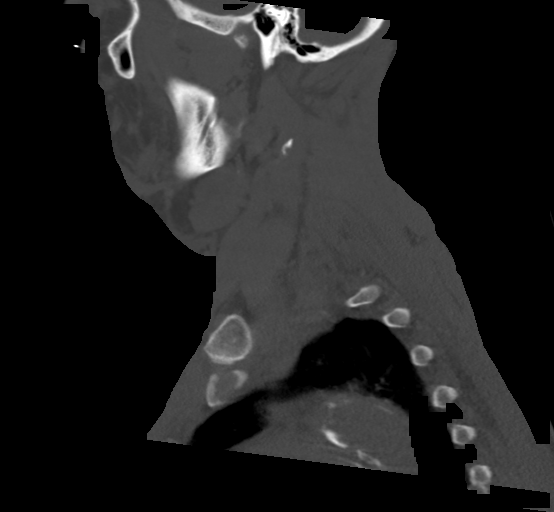
[im 47/113  bone]
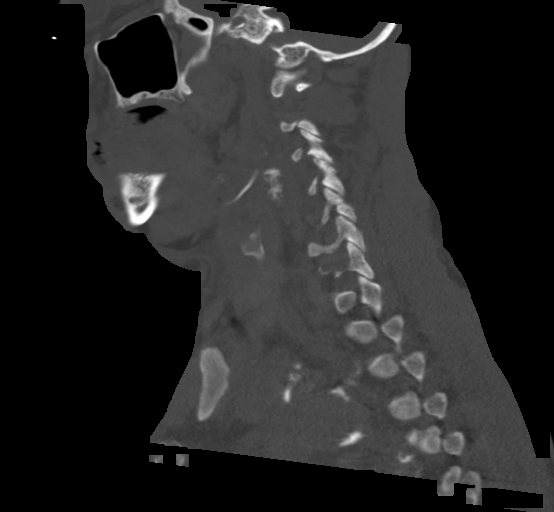
[im 57/113  soft-tissue]
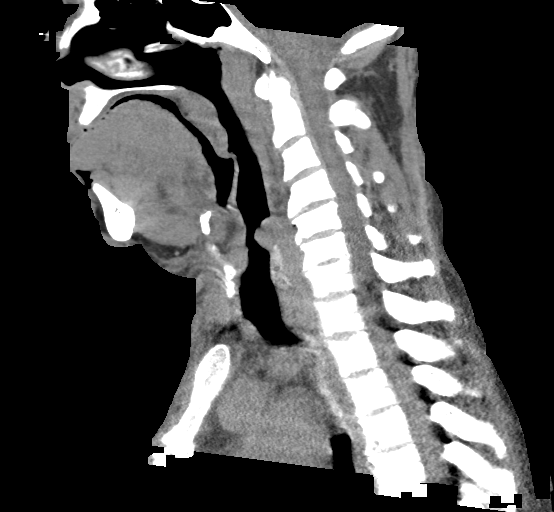
[im 57/113  bone]
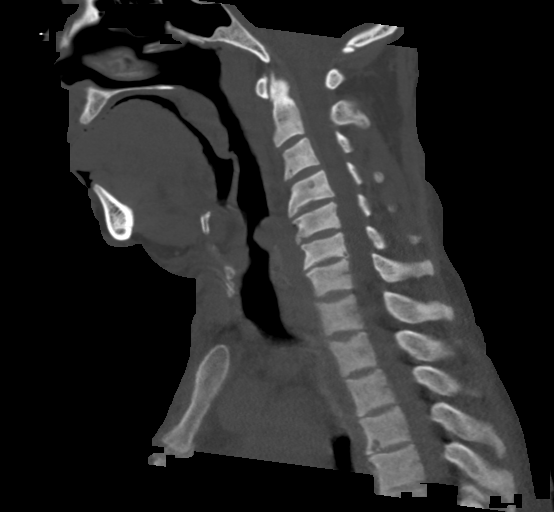
[im 66/113  bone]
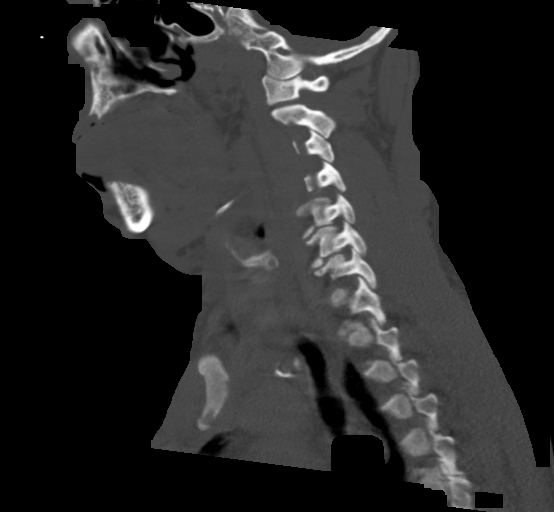
[im 75/113  bone]
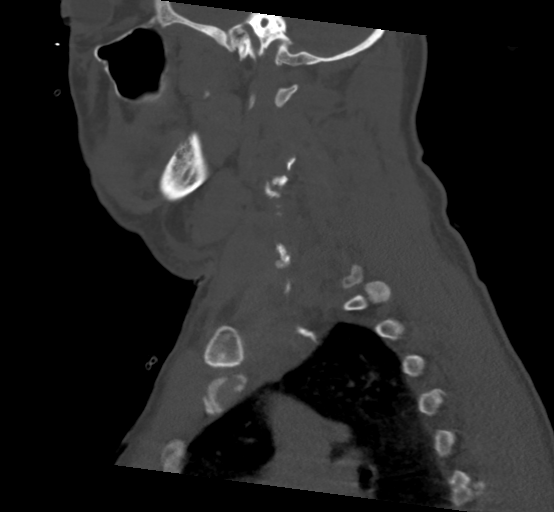

[Series 6: cor neck · coronal · 0.44mm/px · 3 of 125 slices shown]
[im 25/125  bone]
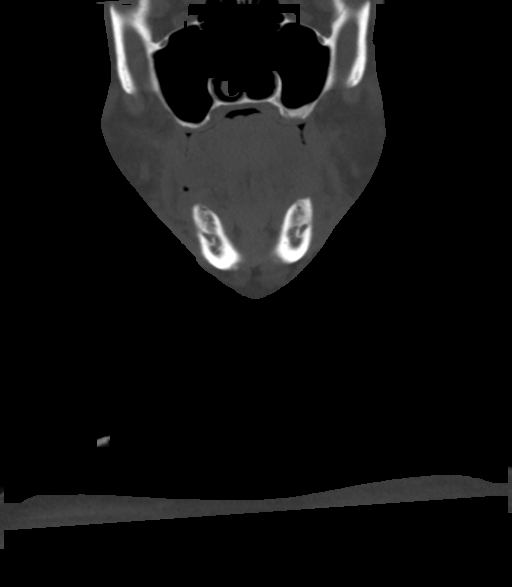
[im 50/125  bone]
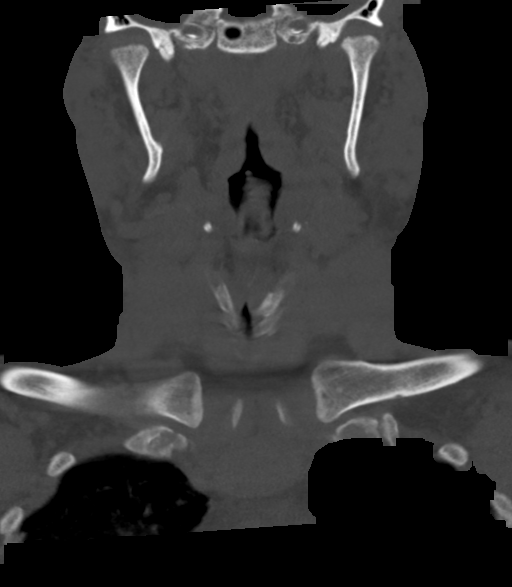
[im 75/125  bone]
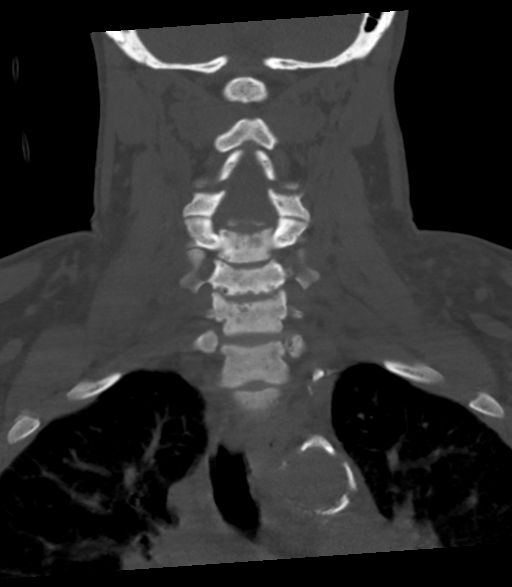

[13 of 35 positions shown; findings below may reference images not displayed]

FINDINGS: Pharynx and larynx: No appreciable swelling or discrete mass within
the oral cavity, pharynx or larynx on this non-contrast examination.
No retropharyngeal collection. No significant airway effacement.

Salivary glands: No inflammation, mass, or stone.

Thyroid: Unremarkable.

Lymph nodes: No pathologically enlarged cervical chain lymph nodes.

Vascular: Lack of intravenous contrast administration limits
assessment of the major vascular structures of the neck. Calcified
plaque within the visualized aortic arch, proximal major branch
vessels of the neck, as well as carotid and vertebral arteries.

Limited intracranial: No evidence of acute intracranial abnormality
within the field of view.

Visualized orbits: Incompletely imaged no mass or acute finding at
the imaged levels.

Mastoids and visualized paranasal sinuses: Trace right maxillary
sinus mucosal thickening. Right mastoid effusion.

Skeleton: Cervical spondylosis with multilevel disc space narrowing,
disc bulges, endplate spurring and uncovertebral hypertrophy. No
appreciable high-grade spinal canal stenosis. Multilevel bony neural
foraminal narrowing. 2 mm C3-C4 grade 1 anterolisthesis. Ventral
osteophytes at C4-C5, C5-C6 and C6-C7.

Upper chest: Respiratory motion artifact limits evaluation of the
imaged lung apices. Within this limitation, no airspace
consolidation is identified.

Other: Stranding is present within the subcutaneous soft tissues of
the neck bilaterally.
IMPRESSION: Stranding is present within the subcutaneous soft tissues of the
neck bilaterally. Given the provided history, findings may reflect
angioedema. Alternative etiologies such as cellulitis cannot be
excluded, and clinical correlation is recommended.

No appreciable swelling or discrete mass within the oral cavity,
pharynx or larynx. No significant airway effacement.

Right mastoid effusion.

Cervical spondylosis as described.

Aortic Atherosclerosis ([I0]-[I0]). Calcified plaque is also
present within the proximal major branch vessels of the neck, as
well as carotid and vertebral arteries.

## 2021-01-14 MED ORDER — DIPHENHYDRAMINE HCL 50 MG/ML IJ SOLN
25.0000 mg | Freq: Once | INTRAMUSCULAR | Status: AC
  Administered 2021-01-14: 25 mg via INTRAVENOUS

## 2021-01-14 MED ORDER — IPRATROPIUM-ALBUTEROL 0.5-2.5 (3) MG/3ML IN SOLN
3.0000 mL | RESPIRATORY_TRACT | Status: DC | PRN
  Administered 2021-01-14: 3 mL via RESPIRATORY_TRACT

## 2021-01-14 MED ORDER — RACEPINEPHRINE HCL 2.25 % IN NEBU
0.5000 mL | INHALATION_SOLUTION | Freq: Once | RESPIRATORY_TRACT | Status: AC
  Administered 2021-01-14: 0.5 mL via RESPIRATORY_TRACT
  Filled 2021-01-14: qty 0.5

## 2021-01-14 MED ORDER — DIPHENHYDRAMINE HCL 50 MG/ML IJ SOLN
INTRAMUSCULAR | Status: AC
  Filled 2021-01-14: qty 1

## 2021-01-14 MED ORDER — SODIUM ZIRCONIUM CYCLOSILICATE 10 G PO PACK
10.0000 g | PACK | Freq: Every day | ORAL | Status: DC
  Administered 2021-01-15: 10 g via ORAL
  Filled 2021-01-14 (×2): qty 1

## 2021-01-14 MED ORDER — METHYLPREDNISOLONE SODIUM SUCC 125 MG IJ SOLR
125.0000 mg | Freq: Once | INTRAMUSCULAR | Status: AC
  Administered 2021-01-14: 125 mg via INTRAVENOUS

## 2021-01-14 NOTE — Progress Notes (Addendum)
DEQUAVIOUS [name]  MRN: [id number]  DOB/AGE: 05/02/1966 55 y.o.  Primary Care Physician:Pcp, No  Admit date: 01/12/2021  Chief Complaint:  Chief Complaint  Patient presents with  . Abdominal Pain  . Emesis  . Diarrhea    S-Pt presented on  01/12/2021 with  Chief Complaint  Patient presents with  . Abdominal Pain  . Emesis  . Diarrhea  .  Patient is known to our practice from earlier admission In 2021.   Patient is a 55 year old African-American male with a past medical history of ESRD, patient is a Monday Wednesday Friday schedule, hypertension, atrial fibrillation and COPD who came to the ER with chief complaint of malaise, chills and fever. Upon evaluation in the ER patient had chest x-ray done which showed patient had patchy perihilar infiltrates.  Patient was admitted with multifocal pneumonia.   Patient seen laying in bed Alert and oriented Complains of discomfort and itching on buttocks and bilateral lower extremities. Says this is the reason he misses dialysis treatments, he doesn't want to be there scratching and moving all the time on the machine   Medications . calcium acetate  1,334 mg Oral TID WC  . Chlorhexidine Gluconate Cloth  6 each Topical Q0600  . cinacalcet  90 mg Oral QPM  . diphenhydrAMINE      . epoetin (EPOGEN/PROCRIT) injection  4,000 Units Intravenous Q M,W,F-HD  . heparin  5,000 Units Subcutaneous Q8H  . hydrOXYzine  25 mg Oral Q6H  . senna-docusate  1 tablet Oral BID  . sodium zirconium cyclosilicate  10 g Oral Daily  . thiamine  100 mg Oral Q M,W,F-HD  . triamcinolone cream   Topical TID         [id number] from the symptoms mentioned above,there are no other symptoms referable to all systems reviewed.  Physical Exam: Vital signs in last 24 hours: Temp:  [97.6 F (36.4 C)-98 F (36.7 C)] 97.6 F (36.4 C) (04/25 0800) Pulse Rate:  [88-102] 102 (04/25 1000) Resp:  [16-18] 16 (04/25 0800) BP: (147-164)/(88-98) 151/98 (04/25  1000) SpO2:  [94 %-100 %] 100 % (04/25 0800) Weight change:  Last BM Date: 01/11/21  Intake/Output from previous day: 04/24 0701 - 04/25 0700 In: 880 [P.O.:480; IV Piggyback:400] Out: -  Total I/O In: 120 [P.O.:120] Out: -    Physical Exam:  General- pt is awake,alert, oriented to time place and person  Resp- No acute REsp distress, Rhonchi present  CVS- S1S2 regular in rate and rhythm  GIT- BS+, soft, Non tender , Non distended  EXT- No LE Edema,  No Cyanosis  SKIN- Buttock and BLL healed lesions  Access- LUE AVF  Lab Results:  CBC  Recent Labs    01/13/21 0511 01/14/21 0508  WBC 8.7 9.8  HGB 10.3* 10.2*  HCT 32.0* 31.9*  PLT 115* 102*    BMET  Recent Labs    01/13/21 0002 01/13/21 0511 01/14/21 0508  NA 140  --  135  K 4.7  --  6.0*  CL 95*  --  93*  CO2 26  --  24  GLUCOSE 91  --  78  BUN 42*  --  61*  CREATININE 10.79* 10.98* 12.22*  CALCIUM 9.2  --  8.9      Most recent Creatinine trend  Lab Results  Component Value Date   CREATININE 12.22 (H) 01/14/2021   CREATININE 10.98 (H) 01/13/2021   CREATININE 10.79 (H) 01/13/2021      MICRO   Recent Results (  from the past 240 hour(s))  Urine Culture     Status: None   Collection Time: 01/13/21  1:53 AM   Specimen: Urine, Random  Result Value Ref Range Status   Specimen Description   Final    URINE, RANDOM Performed at [facility], [address], [place], [place] [address]    Special Requests   Final    NONE Performed at [facility], [address], [place], [place] [address]    Culture   Final    NO GROWTH Performed at [facility], [place] [address] [place], [place] [address]    Report Status 01/14/2021 FINAL  Final  Culture, blood (Routine X 2) w Reflex to ID Panel     Status: None (Preliminary result)   Collection Time: 01/13/21  1:54 AM   Specimen: BLOOD  Result Value Ref Range Status   Specimen Description BLOOD  RIGHT ARM  Final   Special  Requests   Final    BOTTLES DRAWN AEROBIC AND ANAEROBIC Blood Culture adequate volume   Culture   Final    NO GROWTH 1 DAY Performed at [facility], [address], [place], [place] [address]    Report Status PENDING  Incomplete  Resp Panel by RT-PCR (Flu A&B, Covid)     Status: None   Collection Time: 01/13/21  1:56 AM   Specimen: Nasopharyngeal(NP) swabs in vial transport medium  Result Value Ref Range Status   SARS Coronavirus 2 by RT PCR NEGATIVE NEGATIVE Final    Comment: (NOTE) SARS-CoV-2 target nucleic acids are NOT DETECTED.  The SARS-CoV-2 RNA is generally detectable in upper respiratory specimens during the acute phase of infection. The lowest concentration of SARS-CoV-2 viral copies this assay can detect is 138 copies/mL. A negative result does not preclude SARS-Cov-2 infection and should not be used as the sole basis for treatment or other patient management decisions. A negative result may occur with  improper specimen collection/handling, submission of specimen other than nasopharyngeal swab, presence of viral mutation(s) within the areas targeted by this assay, and inadequate number of viral copies(<138 copies/mL). A negative result must be combined with clinical observations, patient history, and epidemiological information. The expected result is Negative.  Fact Sheet for Patients:  EntrepreneurPulse.com.au  Fact Sheet for Healthcare Providers:  IncredibleEmployment.be  This test is no t yet approved or cleared by the [place] FDA and  has been authorized for detection and/or diagnosis of SARS-CoV-2 by FDA under an Emergency Use Authorization (EUA). This EUA will remain  in effect (meaning this test can be used) for the duration of the COVID-19 declaration under Section 564(b)(1) of the Act, 21 U.S.C.section 360bbb-3(b)(1), unless the authorization is terminated  or revoked sooner.       Influenza A by PCR  NEGATIVE NEGATIVE Final   Influenza B by PCR NEGATIVE NEGATIVE Final    Comment: (NOTE) The Xpert Xpress SARS-CoV-2/FLU/RSV plus assay is intended as an aid in the diagnosis of influenza from Nasopharyngeal swab specimens and should not be used as a sole basis for treatment. Nasal washings and aspirates are unacceptable for Xpert Xpress SARS-CoV-2/FLU/RSV testing.  Fact Sheet for Patients: EntrepreneurPulse.com.au  Fact Sheet for Healthcare Providers: IncredibleEmployment.be  This test is not yet approved or cleared by the [place] FDA and has been authorized for detection and/or diagnosis of SARS-CoV-2 by FDA under an Emergency Use Authorization (EUA). This EUA will remain in effect (meaning this test can be used) for the  duration of the COVID-19 declaration under Section 564(b)(1) of the Act, 21 U.S.C. section 360bbb-3(b)(1), unless the authorization is terminated or revoked.  Performed at [facility], [address] [place], [place] [address]   Culture, blood (Routine X 2) w Reflex to ID Panel     Status: None (Preliminary result)   Collection Time: 01/13/21  2:46 AM   Specimen: BLOOD  Result Value Ref Range Status   Specimen Description BLOOD RIGHT ANTECUBITAL  Final   Special Requests   Final    BOTTLES DRAWN AEROBIC AND ANAEROBIC Blood Culture adequate volume   Culture   Final    NO GROWTH 1 DAY Performed at [facility], [name] [place], [place] [address]    Report Status PENDING  Incomplete  Respiratory (~20 pathogens) panel by PCR     Status: None   Collection Time: 01/13/21  8:25 AM   Specimen: Nasopharyngeal Swab; Respiratory  Result Value Ref Range Status   Adenovirus NOT DETECTED NOT DETECTED Final   Coronavirus 229E NOT DETECTED NOT DETECTED Final    Comment: (NOTE) The Coronavirus on the Respiratory Panel, DOES NOT test for the novel  Coronavirus (2019 nCoV)    Coronavirus HKU1 NOT  DETECTED NOT DETECTED Final   Coronavirus NL63 NOT DETECTED NOT DETECTED Final   Coronavirus OC43 NOT DETECTED NOT DETECTED Final   Metapneumovirus NOT DETECTED NOT DETECTED Final   Rhinovirus / Enterovirus NOT DETECTED NOT DETECTED Final   Influenza A NOT DETECTED NOT DETECTED Final   Influenza B NOT DETECTED NOT DETECTED Final   Parainfluenza Virus 1 NOT DETECTED NOT DETECTED Final   Parainfluenza Virus 2 NOT DETECTED NOT DETECTED Final   Parainfluenza Virus 3 NOT DETECTED NOT DETECTED Final   Parainfluenza Virus 4 NOT DETECTED NOT DETECTED Final   Respiratory Syncytial Virus NOT DETECTED NOT DETECTED Final   Bordetella pertussis NOT DETECTED NOT DETECTED Final   Bordetella Parapertussis NOT DETECTED NOT DETECTED Final   Chlamydophila pneumoniae NOT DETECTED NOT DETECTED Final   Mycoplasma pneumoniae NOT DETECTED NOT DETECTED Final    Comment: Performed at [facility], [place]. [address] [place], [place] [address]         Impression:  Patient is a 55 year old freak American male with a past medical history of ESRD on hemodialysis, hypertension, defibrillation, COPD who is currently admitted with    Pneumonia   ESRD on dialysis [facility])   Essential hypertension   Anemia in ESRD (end-stage renal disease) ([place])   Chronic combined systolic (congestive) and diastolic (congestive) heart failure [facility])   Patient outpatient dialysis treatment as Monday Wednesday Friday schedule Optiflex 180 NR 4 hours treatment 67 kg dry weight 2K/2 calcium bath Left AV fistula 15-gauge needles [address]    1) ESRD with hyperkalemia on HD Will maintain outpatient schedule while admitted Scheduled to received dialysis today Patient is agreeable Lokelma daily Potassium will correct with dialysis  2) HTN  Blood pressure elevated Lisinopril discontinued due to suspected allergic reaction Will continue to monitor Hydralazine PRN if needed   3) Anemia of chronic disease  CBC Latest  Ref Rng & Units 01/14/2021 01/13/2021 01/13/2021  WBC 4.0 - 10.5 K/uL 9.8 8.7 9.4  Hemoglobin 13.0 - 17.0 g/dL 10.2(L) 10.3(L) 10.9(L)  Hematocrit 39.0 - 52.0 % 31.9(L) 32.0(L) 33.7(L)  Platelets 150 - 400 K/uL 102(L) 115(L) 126(L)     HGb at goal  Low dose EPO with dialysis  4) Secondary hyperparathyroidism -CKD Mineral-Bone Disorder  Lab Results  Component Value Date   PTH 669 (H) 02/20/2020   CALCIUM 8.9 01/14/2021   CAION 1.26 04/07/2020   PHOS 5.1 (H) 06/20/2020   Secondary Hyperparathyroidism present Phosphorus elevated Calcium Acetate with meals     01/14/2021, 1:17 PM  [employer]

## 2021-01-14 NOTE — Significant Event (Signed)
Rapid Response Event Note   Reason for Call :  Patient complaining of throat swelling- RT stated patient having wheezing lung sounds. Patient's tongue and eyes visualy swollen.  Vitals stable.  Initial Focused Assessment:       Interventions:  Duoneb given- then '25mg'$  IV of benadryl- '125mg'$  IV solumedrol and then racepinephrine. Patient took his first dose of lisinopril yesterday- patient does not have that medicine on his home med list- so it was discontinued.  Plan of Care:     Event Summary:  Patient stated that is swelling is improving and he is feeling better. MD Notified: Dr. Reesa Chew Call Time:09:44 Arrival Time:09:44 End Time:10:07  Penne Lash, RN

## 2021-01-14 NOTE — Progress Notes (Signed)
   01/14/21 0945  Clinical Encounter Type  Visited With Patient  Visit Type Initial;Psychological support;Spiritual support  Referral From Nurse  Consult/Referral To Chaplain   Chaplain responded to a rapid response page. Nurses were actively evaluating the PT. Chaplain "checked in with a nurse, who stated they were good, and thanks for checking in".

## 2021-01-14 NOTE — Progress Notes (Signed)
PROGRESS NOTE    Jordan [name]  [id number] DOB: 1966/09/21 DOA: 01/12/2021 PCP: Pcp, No   Chief complaint.  Shortness of breath. Brief Narrative:  Jordan [name] is a 55 y.o. male with medical history significant for ESRD on HD MWF, HTN, A. fib and COPD who presents to the emergency room with a 1 day history of generalized malaise," feeling jittery", chills and fever associated with mild shortness of breath.  Also has a nonproductive cough.  Has not missed any dialysis sessions and no sick contacts.   Chest x-ray showed patchy PERI hilar infiltrates and peribronchial thickening suggest multifocal pneumonia.  CT abdomen/pelvis showed small amount of ascites.  Patient is placed on Rocephin and Zithromax for community-acquired pneumonia. 4/25: Rapid response was called earlier today with his complaint of difficulty swallowing, patient's tongue and eyes appear swollen-do not know the baseline, able to speak in full sentences, able to manage his secretions and no difficulty breathing.  Patient was started on lisinopril recently which was discontinued, Benadryl, Solu-Medrol, racepinephrine was given for concern of developing angioedema.  Assessment & Plan:   Principal Problem:   Pneumonia Active Problems:   ESRD on dialysis [facility])   Essential hypertension   Anemia in ESRD (end-stage renal disease) (HCC)   Chronic combined systolic (congestive) and diastolic (congestive) heart failure (HCC)  Difficulty swallowing.  Patient was complaining of clogging in his neck, stating that he cannot swallow but able to manage his secretions.  Appear little anxious.  Tongue appears back-unknown baseline, eyes appear puffy, he was on lisinopril. -We will treat as angioedema and lisinopril was discontinued. -Gave patient racepinephrine, Benadryl and Solu-Medrol. -Monitor for any respiratory compromises. -DuoNeb treatments as needed.   Multifocal pneumonia. Patient did had positive procalcitonin but  difficult to explain with ESRD.  Respiratory viral panel and COVID was negative.  We will treat due to symptoms.  Blood cultures remain negative. -Continue current antibiotics with Rocephin and Zithromax.   End-stage renal disease on dialysis/Anemia of chronic kidney disease/Chronic thrombocytopenia. Nephrology is aware. Reviewed previous labs, platelet count of 131 on 10/09/2020.  Currently stable. -Continue with routine dialysis.  Hyperkalemia. -Patient is going for dialysis today. -Give him a dose of Lokelma   Chronic combined systolic and diastolic congestive heart failure./Chronic hypoxemic respiratory failure. Patient was on oxygen at 2 L as needed at home.  Currently he is on 2 L.  Appears euvolemic.   DVT prophylaxis: Heparin Code Status: Full Family Communication: Discussed with patient. Disposition Plan:  .   Status is: Inpatient  Remains inpatient appropriate because:Inpatient level of care appropriate due to severity of illness   Dispo: The patient is from: Home              Anticipated d/c is to: Home              Patient currently is not medically stable to d/c.   Difficult to place patient No   I/O last 3 completed shifts: In: 1312.7 [P.O.:480; IV Piggyback:832.7] Out: -  Total I/O In: 120 [P.O.:120] Out: -     Consultants:   Nephrology  Procedures: None  Antimicrobials:  Rocephin Zithromax.  Subjective: Patient was complaining of some throat clogging, stating that he is having difficulty with swallowing and his throat seems swelling.  Able to manage his secretions.  No difficulty breathing.  Objective: Vitals:   01/13/21 2259 01/14/21 0440 01/14/21 0800 01/14/21 1000  BP: (!) 164/96 (!) 149/88 (!) 154/98 (!) 151/98  Pulse: 97 96  98 (!) 102  Resp: '16 18 16   '$ Temp: 98 F (36.7 C) 97.8 F (36.6 C) 97.6 F (36.4 C)   TempSrc: Oral  Oral   SpO2: 96% 94% 100%   Weight:      Height:        Intake/Output Summary (Last 24 hours) at 01/14/2021  1339 Last data filed at 01/14/2021 1023 Gross per 24 hour  Intake 360 ml  Output --  Net 360 ml   Filed Weights   01/12/21 2358  Weight: 70.3 kg    Examination:  General.  Well-developed gentleman, in no acute distress. Pulmonary.  Few scattered wheeze involving upper lobes, normal respiratory effort. CV.  Regular rate and rhythm, no JVD, rub or murmur. Abdomen.  Soft, nontender, nondistended, BS positive. CNS.  Alert and oriented x3.  No focal neurologic deficit. Extremities.  No edema, no cyanosis, pulses intact and symmetrical. Psychiatry.  Judgment and insight appears normal.  Data Reviewed: I have personally reviewed following labs and imaging studies  CBC: Recent Labs  Lab 01/13/21 0002 01/13/21 0511 01/14/21 0508  WBC 9.4 8.7 9.8  NEUTROABS  --   --  7.1  HGB 10.9* 10.3* 10.2*  HCT 33.7* 32.0* 31.9*  MCV 95.2 95.8 95.5  PLT 126* 115* [id number]*   Basic Metabolic Panel: Recent Labs  Lab 01/13/21 0002 01/13/21 0511 01/14/21 0508  NA 140  --  135  K 4.7  --  6.0*  CL 95*  --  93*  CO2 26  --  24  GLUCOSE 91  --  78  BUN 42*  --  61*  CREATININE 10.79* 10.98* 12.22*  CALCIUM 9.2  --  8.9   GFR: Estimated Creatinine Clearance: 5.9 mL/min (A) (by C-G formula based on SCr of 12.22 mg/dL (H)). Liver Function Tests: Recent Labs  Lab 01/13/21 0002  AST 15  ALT 10  ALKPHOS 156*  BILITOT 1.5*  PROT 8.4*  ALBUMIN 4.0   Recent Labs  Lab 01/13/21 0002  LIPASE 45   No results for input(s): AMMONIA in the last 168 hours. Coagulation Profile: No results for input(s): INR, PROTIME in the last 168 hours. Cardiac Enzymes: No results for input(s): CKTOTAL, CKMB, CKMBINDEX, TROPONINI in the last 168 hours. BNP (last 3 results) No results for input(s): PROBNP in the last 8760 hours. HbA1C: No results for input(s): HGBA1C in the last 72 hours. CBG: Recent Labs  Lab 01/14/21 0946  GLUCAP 136*   Lipid Profile: No results for input(s): CHOL, HDL, LDLCALC,  TRIG, CHOLHDL, LDLDIRECT in the last 72 hours. Thyroid Function Tests: No results for input(s): TSH, T4TOTAL, FREET4, T3FREE, THYROIDAB in the last 72 hours. Anemia Panel: No results for input(s): VITAMINB12, FOLATE, FERRITIN, TIBC, IRON, RETICCTPCT in the last 72 hours. Sepsis Labs: Recent Labs  Lab 01/13/21 0511  PROCALCITON 0.54    Recent Results (from the past 240 hour(s))  Urine Culture     Status: None   Collection Time: 01/13/21  1:53 AM   Specimen: Urine, Random  Result Value Ref Range Status   Specimen Description   Final    URINE, RANDOM Performed at [facility], [address], [place], [place] [address]    Special Requests   Final    NONE Performed at [facility], [address] [place], [place] [address]    Culture   Final    NO GROWTH Performed at [facility], [place] [address], [place], [place] [address]  Report Status 01/14/2021 FINAL  Final  Culture, blood (Routine X 2) w Reflex to ID Panel     Status: None (Preliminary result)   Collection Time: 01/13/21  1:54 AM   Specimen: BLOOD  Result Value Ref Range Status   Specimen Description BLOOD  RIGHT ARM  Final   Special Requests   Final    BOTTLES DRAWN AEROBIC AND ANAEROBIC Blood Culture adequate volume   Culture   Final    NO GROWTH 1 DAY Performed at [facility], [address], [place], [place] [address]    Report Status PENDING  Incomplete  Resp Panel by RT-PCR (Flu A&B, Covid)     Status: None   Collection Time: 01/13/21  1:56 AM   Specimen: Nasopharyngeal(NP) swabs in vial transport medium  Result Value Ref Range Status   SARS Coronavirus 2 by RT PCR NEGATIVE NEGATIVE Final    Comment: (NOTE) SARS-CoV-2 target nucleic acids are NOT DETECTED.  The SARS-CoV-2 RNA is generally detectable in upper respiratory specimens during the acute phase of infection. The lowest concentration of SARS-CoV-2 viral copies this assay can detect is 138 copies/mL. A negative  result does not preclude SARS-Cov-2 infection and should not be used as the sole basis for treatment or other patient management decisions. A negative result may occur with  improper specimen collection/handling, submission of specimen other than nasopharyngeal swab, presence of viral mutation(s) within the areas targeted by this assay, and inadequate number of viral copies(<138 copies/mL). A negative result must be combined with clinical observations, patient history, and epidemiological information. The expected result is Negative.  Fact Sheet for Patients:  EntrepreneurPulse.com.au  Fact Sheet for Healthcare Providers:  IncredibleEmployment.be  This test is no t yet approved or cleared by the [place] FDA and  has been authorized for detection and/or diagnosis of SARS-CoV-2 by FDA under an Emergency Use Authorization (EUA). This EUA will remain  in effect (meaning this test can be used) for the duration of the COVID-19 declaration under Section 564(b)(1) of the Act, 21 U.S.C.section 360bbb-3(b)(1), unless the authorization is terminated  or revoked sooner.       Influenza A by PCR NEGATIVE NEGATIVE Final   Influenza B by PCR NEGATIVE NEGATIVE Final    Comment: (NOTE) The Xpert Xpress SARS-CoV-2/FLU/RSV plus assay is intended as an aid in the diagnosis of influenza from Nasopharyngeal swab specimens and should not be used as a sole basis for treatment. Nasal washings and aspirates are unacceptable for Xpert Xpress SARS-CoV-2/FLU/RSV testing.  Fact Sheet for Patients: EntrepreneurPulse.com.au  Fact Sheet for Healthcare Providers: IncredibleEmployment.be  This test is not yet approved or cleared by the [place] FDA and has been authorized for detection and/or diagnosis of SARS-CoV-2 by FDA under an Emergency Use Authorization (EUA). This EUA will remain in effect (meaning this test can be used)  for the duration of the COVID-19 declaration under Section 564(b)(1) of the Act, 21 U.S.C. section 360bbb-3(b)(1), unless the authorization is terminated or revoked.  Performed at [facility], [address] [place],  [address]   Culture, blood (Routine X 2) w Reflex to ID Panel     Status: None (Preliminary result)   Collection Time: 01/13/21  2:46 AM   Specimen: BLOOD  Result Value Ref Range Status   Specimen Description BLOOD RIGHT ANTECUBITAL  Final   Special Requests   Final    BOTTLES DRAWN AEROBIC AND ANAEROBIC Blood Culture adequate volume   Culture   Final  NO GROWTH 1 DAY Performed at [facility], [address] [place], [place] [address]    Report Status PENDING  Incomplete  Respiratory (~20 pathogens) panel by PCR     Status: None   Collection Time: 01/13/21  8:25 AM   Specimen: Nasopharyngeal Swab; Respiratory  Result Value Ref Range Status   Adenovirus NOT DETECTED NOT DETECTED Final   Coronavirus 229E NOT DETECTED NOT DETECTED Final    Comment: (NOTE) The Coronavirus on the Respiratory Panel, DOES NOT test for the novel  Coronavirus (2019 nCoV)    Coronavirus HKU1 NOT DETECTED NOT DETECTED Final   Coronavirus NL63 NOT DETECTED NOT DETECTED Final   Coronavirus OC43 NOT DETECTED NOT DETECTED Final   Metapneumovirus NOT DETECTED NOT DETECTED Final   Rhinovirus / Enterovirus NOT DETECTED NOT DETECTED Final   Influenza A NOT DETECTED NOT DETECTED Final   Influenza B NOT DETECTED NOT DETECTED Final   Parainfluenza Virus 1 NOT DETECTED NOT DETECTED Final   Parainfluenza Virus 2 NOT DETECTED NOT DETECTED Final   Parainfluenza Virus 3 NOT DETECTED NOT DETECTED Final   Parainfluenza Virus 4 NOT DETECTED NOT DETECTED Final   Respiratory Syncytial Virus NOT DETECTED NOT DETECTED Final   Bordetella pertussis NOT DETECTED NOT DETECTED Final   Bordetella Parapertussis NOT DETECTED NOT DETECTED Final   Chlamydophila pneumoniae NOT DETECTED  NOT DETECTED Final   Mycoplasma pneumoniae NOT DETECTED NOT DETECTED Final    Comment: Performed at [facility], [place]. [address] [place], [place] [address]         Radiology Studies: DG Chest 2 View  Result Date: 01/13/2021 CLINICAL DATA:  Shortness of breath. Lightheaded. Dizzy, nausea, vomiting, diarrhea. EXAM: CHEST - 2 VIEW COMPARISON:  10/09/2020 FINDINGS: Shallow inspiration. Cardiac enlargement. Patchy infiltrates and peribronchial thickening in the lungs with perihilar distribution. Changes suggest multifocal pneumonia. No pleural effusions. No pneumothorax. Mediastinal contours appear intact. Calcification of the aorta. IMPRESSION: Cardiac enlargement. Patchy perihilar infiltrates and peribronchial thickening suggesting multifocal pneumonia. Electronically Signed   By: Lucienne Capers M.D.   On: 01/13/2021 00:49   CT ABDOMEN PELVIS W CONTRAST  Result Date: 01/13/2021 CLINICAL DATA:  Acute diffuse abdominal pain. Lightheaded and dizzy. Nausea, vomiting, and diarrhea. EXAM: CT ABDOMEN AND PELVIS WITH CONTRAST TECHNIQUE: Multidetector CT imaging of the abdomen and pelvis was performed using the standard protocol following bolus administration of intravenous contrast. CONTRAST:  63m OMNIPAQUE IOHEXOL 300 MG/ML  SOLN COMPARISON:  07/22/2019 FINDINGS: Lower chest: Motion artifact limits examination. Lung bases appear grossly clear. Cardiac enlargement. Hepatobiliary: No focal liver lesions. Gallbladder wall thickening and edema likely related to ascites. No gallstones. No bile duct dilatation. Pancreas: Unremarkable. No pancreatic ductal dilatation or surrounding inflammatory changes. Spleen: Normal in size without focal abnormality. Adrenals/Urinary Tract: No adrenal gland nodules. Prominent bilateral renal parenchymal atrophy. Intrarenal vascular calcifications. No hydronephrosis or hydroureter. Bladder wall is diffusely thickened which may be due to outlet obstruction or cystitis.  Stomach/Bowel: The stomach, small bowel, and colon are not abnormally distended. Stool fills the colon. No obvious inflammatory changes are demonstrated. Appendix is not identified. Vascular/Lymphatic: Diffuse aortic and branch vessel calcifications. 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 Calcification and thrombus of the origin of the right iliac artery, similar to prior study. No significant lymphadenopathy. Reproductive: Prostate gland is enlarged. Other: Moderate free fluid throughout the abdomen and pelvis, likely ascites. No free air. 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  IMPRESSION: 1. Moderate free fluid throughout the abdomen and pelvis, likely ascites. 2. Cardiac enlargement. 3. Prominent bilateral renal parenchymal atrophy. 4. Bladder wall thickening may be due to outlet obstruction or cystitis. 5. Prominent aortic and abdominal arterial atherosclerosis. 6. 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 Aortic Atherosclerosis (ICD10-I70.0). Electronically Signed   By: Lucienne Capers M.D.   On: 01/13/2021 01:27    Scheduled Meds: . calcium acetate  1,334 mg Oral TID WC  . Chlorhexidine Gluconate Cloth  6 each Topical Q0600  . cinacalcet  90 mg Oral QPM  . diphenhydrAMINE      . epoetin (EPOGEN/PROCRIT) injection  4,000 Units Intravenous Q M,W,F-HD  . heparin  5,000 Units Subcutaneous Q8H  . hydrOXYzine  25 mg Oral Q6H  . senna-docusate  1 tablet Oral BID  . sodium zirconium cyclosilicate  10 g Oral Daily  . thiamine  100 mg Oral Q M,W,F-HD  . triamcinolone cream   Topical TID   Continuous Infusions: . azithromycin 500 mg (01/14/21 1202)  . cefTRIAXone (ROCEPHIN)  IV 2 g (01/14/21 1051)     LOS: 1 day    Time spent: 40 minutes.  This record has been created using Systems analyst. Errors have been sought and corrected,but may  not always be located. Such creation errors do not reflect on the standard of care.  Lorella Nimrod, MD [facility]   To contact the attending provider between 7A-7P or the covering provider during after hours 7P-7A, please log into the web site [email or web address] and access using universal [place] password for that web site. If you do not have the password, please call the hospital operator.  01/14/2021, 1:39 PM

## 2021-01-14 NOTE — Plan of Care (Signed)

## 2021-01-14 NOTE — TOC Progression Note (Signed)
Transition of Care [facility]) - Progression Note    Patient Details  Name: [name] MRN: [id number] Date of Birth: 07-Jul-1966  Transition of Care [facility]) CM/SW Contact  Beverly Sessions, RN Phone Number: 01/14/2021, 1:40 PM  Clinical Narrative:      Elvera Bicker dialysis liaison notified of admission.         Expected Discharge Plan and Services                                                 Social Determinants of Health (SDOH) Interventions    Readmission Risk Interventions No flowsheet data found.

## 2021-01-14 NOTE — Progress Notes (Signed)
Patient complains of swelling to throat, states it feels like he can barely swallow. O2 sats 96 % on 3 liters. Patient denies anything different with his breakfast or any difficulty swallowing or issues at that time. patient denies any pain. no acute distress noted at this time. MD called and texted. Respiratory called and Charge nurse made aware.  Patient stated his difficulty swallowing was getting worse.  Rapid called. MD evaluated patient bedside.  Medications given per order.  Duoneb given per respiratory.  Patient states at this time he is starting to feel better at this time.  Will continue to monitor.

## 2021-01-15 LAB — RENAL FUNCTION PANEL
Albumin: 3.7 g/dL (ref 3.5–5.0)
Anion gap: 16 — ABNORMAL HIGH (ref 5–15)
BUN: 39 mg/dL — ABNORMAL HIGH (ref 6–20)
CO2: 26 mmol/L (ref 22–32)
Calcium: 9.1 mg/dL (ref 8.9–10.3)
Chloride: 95 mmol/L — ABNORMAL LOW (ref 98–111)
Creatinine, Ser: 8.13 mg/dL — ABNORMAL HIGH (ref 0.61–1.24)
GFR, Estimated: 7 mL/min — ABNORMAL LOW (ref >60)
Glucose, Bld: 146 mg/dL — ABNORMAL HIGH (ref 70–99)
Phosphorus: 5.8 mg/dL — ABNORMAL HIGH (ref 2.5–4.6)
Potassium: 5 mmol/L (ref 3.5–5.1)
Sodium: 137 mmol/L (ref 135–145)

## 2021-01-15 MED ORDER — AZITHROMYCIN 500 MG PO TABS: 500.0000 mg | ORAL_TABLET | Freq: Every day | ORAL | 0 refills | Status: AC

## 2021-01-15 MED ORDER — CEFDINIR 300 MG PO CAPS: 300.0000 mg | ORAL_CAPSULE | Freq: Two times a day (BID) | ORAL | 0 refills | Status: AC

## 2021-01-15 MED ORDER — HYDRALAZINE HCL 50 MG PO TABS: 50.0000 mg | ORAL_TABLET | Freq: Three times a day (TID) | ORAL | 0 refills | Status: DC | PRN

## 2021-01-15 NOTE — Plan of Care (Signed)
Axox4. Calm and cooperative and able to voice his needs. Vitals stable. No complaint of pain. Dialysis yesterday and per report 2L removed. On 2L Chronic 02. Vitals stable. Safety measures in place. Will continue to monitor.  Problem: Education: Goal: Knowledge of General Education information will improve Description: Including pain rating scale, medication(s)/side effects and non-pharmacologic comfort measures Outcome: Progressing   Problem: Health Behavior/Discharge Planning: Goal: Ability to manage health-related needs will improve Outcome: Progressing   Problem: Clinical Measurements: Goal: Ability to maintain clinical measurements within normal limits will improve Outcome: Progressing Goal: Will remain free from infection Outcome: Progressing Goal: Diagnostic test results will improve Outcome: Progressing Goal: Respiratory complications will improve Outcome: Progressing Goal: Cardiovascular complication will be avoided Outcome: Progressing   Problem: Activity: Goal: Risk for activity intolerance will decrease Outcome: Progressing   Problem: Nutrition: Goal: Adequate nutrition will be maintained Outcome: Progressing   Problem: Elimination: Goal: Will not experience complications related to bowel motility Outcome: Progressing Goal: Will not experience complications related to urinary retention Outcome: Progressing   Problem: Pain Managment: Goal: General experience of comfort will improve Outcome: Progressing   Problem: Safety: Goal: Ability to remain free from injury will improve Outcome: Progressing   Problem: Skin Integrity: Goal: Risk for impaired skin integrity will decrease Outcome: Progressing   Problem: Activity: Goal: Ability to tolerate increased activity will improve Outcome: Progressing   Problem: Clinical Measurements: Goal: Ability to maintain a body temperature in the normal range will improve Outcome: Progressing   Problem:  Respiratory: Goal: Ability to maintain adequate ventilation will improve Outcome: Progressing Goal: Ability to maintain a clear airway will improve Outcome: Progressing

## 2021-01-15 NOTE — Progress Notes (Signed)
Patient picked up and transported to home via transport.  Portable oxygen provided.  No acute distress noted.  Care relinquished.

## 2021-01-15 NOTE — Discharge Summary (Signed)
Physician Discharge Summary  Jordan [name] [id number] DOB: 05/25/1966 DOA: 01/12/2021  PCP: Pcp, No  Admit date: 01/12/2021 Discharge date: 01/15/2021  Admitted From: Home Disposition: Home   Recommendations for Outpatient Follow-up:  1. Follow up with PCP in 1-2 weeks 2. Follow-up and your dialysis center 3. Please obtain BMP/CBC in one week 4. Please follow up on the following pending results: None  Home Health: Yes Equipment/Devices: Home oxygen Discharge Condition: Stable CODE STATUS: Full Diet recommendation: Heart Healthy Neysa Hotter  Brief/Interim Summary: Jordan Chapple Hawkinsis a 55 y.o.malewith medical history significant forESRD on HD MWF, HTN, A. fib and COPD who presents to the emergency room with a 1 day history of generalized malaise,"feeling jittery", chills and fever associated with mild shortness of breath. Also has a nonproductive cough. Has not missed any dialysis sessions and no sick contacts.  Chest x-ray showed patchy peri hilar infiltrates and peribronchial thickening suggest multifocal pneumonia.  CT abdomen/pelvis showed small amount of ascites.  Patient is placed on Rocephin and Zithromax for community-acquired pneumonia, received 3 days of IV antibiotics and discharged on 2 more days of Zithromax and cefdinir.  4/25: Rapid response was called earlier today with his complaint of difficulty swallowing, patient's tongue and eyes appear swollen-do not know the baseline, able to speak in full sentences, able to manage his secretions and no difficulty breathing.  Patient was started on lisinopril recently which was discontinued, Benadryl, Solu-Medrol, racepinephrine was given for concern of developing angioedema.  Patient should not take ACE inhibitor. He will continue to take hydralazine for his blood pressure and follow-up with his doctors for further recommendations.  Discharge Diagnoses:  Principal Problem:   Multifocal pneumonia Active Problems:   ESRD on  dialysis [facility])   Essential hypertension   Anemia in ESRD (end-stage renal disease) (HCC)   Chronic combined systolic (congestive) and diastolic (congestive) heart failure [facility])  Discharge Instructions  Discharge Instructions    Diet - low sodium heart healthy   Complete by: As directed    Discharge instructions   Complete by: As directed    It was pleasure taking care of you. You developed an adverse reaction with lisinopril, do not take it again. Continue taking your hydralazine 3 times a day to keep your blood pressure under good control. You are also being given 2 days of antibiotics, please take it as directed. Continue going for dialysis. Follow-up closely with your doctors   Increase activity slowly   Complete by: As directed      Allergies as of 01/15/2021      Reactions   Lisinopril Swelling      Medication List    STOP taking these medications   cephALEXin 500 MG capsule Commonly known as: KEFLEX   predniSONE 10 MG tablet Commonly known as: DELTASONE     TAKE these medications   Auryxia 1 GM 210 MG(Fe) tablet Generic drug: ferric citrate Take 420 mg by mouth every Monday, Wednesday, and Friday with hemodialysis.   azithromycin 500 MG tablet Commonly known as: ZITHROMAX Take 1 tablet (500 mg total) by mouth daily for 2 days.   calcium acetate 667 MG capsule Commonly known as: PHOSLO Take 2 capsules (1,334 mg total) by mouth 3 (three) times daily with meals.   cefdinir 300 MG capsule Commonly known as: OMNICEF Take 1 capsule (300 mg total) by mouth 2 (two) times daily for 2 days.   cinacalcet 90 MG tablet Commonly known as: SENSIPAR Take 1 tablet by mouth every evening.  epoetin alfa 4000 UNIT/ML injection Commonly known as: EPOGEN Inject 1 mL (4,000 Units total) into the vein every Monday, Wednesday, and Friday with hemodialysis.   hydrALAZINE 50 MG tablet Commonly known as: APRESOLINE Take 1 tablet (50 mg total) by mouth 3 (three) times daily  as needed.   hydrOXYzine 10 MG tablet Commonly known as: ATARAX/VISTARIL Take 10 mg by mouth 3 (three) times daily as needed. What changed: Another medication with the same name was removed. Continue taking this medication, and follow the directions you see here.   patiromer 8.4 g packet Commonly known as: VELTASSA Take by mouth.   Sarna lotion Generic drug: camphor-menthol Apply topically.   sevelamer carbonate 800 MG tablet Commonly known as: RENVELA Take 800 mg by mouth 3 (three) times daily with meals.   thiamine 100 MG tablet Take 1 tablet (100 mg total) by mouth daily. What changed: when to take this   triamcinolone cream 0.1 % Commonly known as: KENALOG SMARTSIG:1 Application Topical 2-3 Times Daily            Durable Medical Equipment  (From admission, onward)         Start     Ordered   01/15/21 1136  For home use only DME oxygen  Once       Question Answer Comment  Length of Need Lifetime   Mode or (Route) Nasal cannula   Liters per Minute 2   Frequency Continuous (stationary and portable oxygen unit needed)   Oxygen conserving device No   Oxygen delivery system Gas      01/15/21 1135          Allergies  Allergen Reactions  . Lisinopril Swelling    Consultations:  Nephrology  Procedures/Studies: DG Chest 2 View  Result Date: 01/13/2021 CLINICAL DATA:  Shortness of breath. Lightheaded. Dizzy, nausea, vomiting, diarrhea. EXAM: CHEST - 2 VIEW COMPARISON:  10/09/2020 FINDINGS: Shallow inspiration. Cardiac enlargement. Patchy infiltrates and peribronchial thickening in the lungs with perihilar distribution. Changes suggest multifocal pneumonia. No pleural effusions. No pneumothorax. Mediastinal contours appear intact. Calcification of the aorta. IMPRESSION: Cardiac enlargement. Patchy perihilar infiltrates and peribronchial thickening suggesting multifocal pneumonia. Electronically Signed   By: Lucienne Capers M.D.   On: 01/13/2021 00:49   CT  SOFT TISSUE NECK WO CONTRAST  Result Date: 01/14/2021 CLINICAL DATA:  Laryngeal edema. Additional history provided: Rapid response called earlier today due to complaint of difficulty swallowing, patient's tongue and eyes appears swollen, patient able to speak in full sentences, able to manage secretions without difficulty breathing. Patient was started on lisinopril recently which was discontinued. Concern for angioedema. EXAM: CT NECK WITHOUT CONTRAST TECHNIQUE: Multidetector CT imaging of the neck was performed following the standard protocol without intravenous contrast. COMPARISON:  Neck CT 03/19/2018 FINDINGS: Pharynx and larynx: No appreciable swelling or discrete mass within the oral cavity, pharynx or larynx on this non-contrast examination. No retropharyngeal collection. No significant airway effacement. Salivary glands: No inflammation, mass, or stone. Thyroid: Unremarkable. Lymph nodes: No pathologically enlarged cervical chain lymph nodes. Vascular: Lack of intravenous contrast administration limits assessment of the major vascular structures of the neck. Calcified plaque within the visualized aortic arch, proximal major branch vessels of the neck, as well as carotid and vertebral arteries. Limited intracranial: No evidence of acute intracranial abnormality within the field of view. Visualized orbits: Incompletely imaged no mass or acute finding at the imaged levels. Mastoids and visualized paranasal sinuses: Trace right maxillary sinus mucosal thickening. Right mastoid effusion. Skeleton: Cervical  spondylosis with multilevel disc space narrowing, disc bulges, endplate spurring and uncovertebral hypertrophy. No appreciable high-grade spinal canal stenosis. Multilevel bony neural foraminal narrowing. 2 mm C3-C4 grade 1 anterolisthesis. Ventral osteophytes at C4-C5, C5-C6 and C6-C7. Upper chest: Respiratory motion artifact limits evaluation of the imaged lung apices. Within this limitation, no airspace  consolidation is identified. Other: Stranding is present within the subcutaneous soft tissues of the neck bilaterally. IMPRESSION: Vanessa Bonneville is present within the subcutaneous soft tissues of the neck bilaterally. Given the provided history, findings may reflect angioedema. Alternative etiologies such as cellulitis cannot be excluded, and clinical correlation is recommended. No appreciable swelling or discrete mass within the oral cavity, pharynx or larynx. No significant airway effacement. Right mastoid effusion. Cervical spondylosis as described. Aortic Atherosclerosis (ICD10-I70.0). Calcified plaque is also present within the proximal major branch vessels of the neck, as well as carotid and vertebral arteries. Electronically Signed   By: Kellie Simmering DO   On: 01/14/2021 15:24   CT ABDOMEN PELVIS W CONTRAST  Result Date: 01/13/2021 CLINICAL DATA:  Acute diffuse abdominal pain. Lightheaded and dizzy. Nausea, vomiting, and diarrhea. EXAM: CT ABDOMEN AND PELVIS WITH CONTRAST TECHNIQUE: Multidetector CT imaging of the abdomen and pelvis was performed using the standard protocol following bolus administration of intravenous contrast. CONTRAST:  54m OMNIPAQUE IOHEXOL 300 MG/ML  SOLN COMPARISON:  07/22/2019 FINDINGS: Lower chest: Motion artifact limits examination. Lung bases appear grossly clear. Cardiac enlargement. Hepatobiliary: No focal liver lesions. Gallbladder wall thickening and edema likely related to ascites. No gallstones. No bile duct dilatation. Pancreas: Unremarkable. No pancreatic ductal dilatation or surrounding inflammatory changes. Spleen: Normal in size without focal abnormality. Adrenals/Urinary Tract: No adrenal gland nodules. Prominent bilateral renal parenchymal atrophy. Intrarenal vascular calcifications. No hydronephrosis or hydroureter. Bladder wall is diffusely thickened which may be due to outlet obstruction or cystitis. Stomach/Bowel: The stomach, small bowel, and colon are not  abnormally distended. Stool fills the colon. No obvious inflammatory changes are demonstrated. Appendix is not identified. Vascular/Lymphatic: Diffuse aortic and branch vessel calcifications. 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 Calcification and thrombus of the origin of the right iliac artery, similar to prior study. No significant lymphadenopathy. Reproductive: Prostate gland is enlarged. Other: Moderate free fluid throughout the abdomen and pelvis, likely ascites. No free air. 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 IMPRESSION: 1. Moderate free fluid throughout the abdomen and pelvis, likely ascites. 2. Cardiac enlargement. 3. Prominent bilateral renal parenchymal atrophy. 4. Bladder wall thickening may be due to outlet obstruction or cystitis. 5. Prominent aortic and abdominal arterial atherosclerosis. 6. 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 Aortic Atherosclerosis (ICD10-I70.0). Electronically Signed   By: WLucienne CapersM.D.   On: 01/13/2021 01:27     Subjective: Patient was seen and examined today.  No new complaint.  His difficulty swallowing and breathing has been improved.  Tongue looks much better, we discussed about not using lisinopril in future.  Discharge Exam: Vitals:   01/15/21 0756 01/15/21 1105  BP: (!) 156/101 120/80  Pulse: 90   Resp: 14   Temp: 98 F (36.7 C) 97.9 F (36.6 C)  SpO2: 100%    Vitals:   01/14/21 2304 01/15/21 0451 01/15/21 0756 01/15/21 1105  BP: (!) 157/97 130/82 (!) 156/101 120/80  Pulse: (!) 108 93 90   Resp: '18 18 14   '$ Temp: 98.8 F (37.1 C) 98.5 F (36.9 C) 98 F (36.7 C) 97.9 F (36.6 C)  TempSrc: Oral  Oral Oral  SpO2: 90% 100% 100%   Weight:      Height:        General: Pt is alert, awake, not in acute distress Cardiovascular: RRR, S1/S2 +, no rubs, no gallops Respiratory: CTA bilaterally,  no wheezing, no rhonchi Abdominal: Soft, NT, ND, bowel sounds + Extremities: no edema, no cyanosis   The results of significant diagnostics from this hospitalization (including imaging, microbiology, ancillary and laboratory) are listed below for reference.    Microbiology: Recent Results (from the past 240 hour(s))  Urine Culture     Status: None   Collection Time: 01/13/21  1:53 AM   Specimen: Urine, Random  Result Value Ref Range Status   Specimen Description   Final    URINE, RANDOM Performed at [facility], [address] [place], [place] [address]    Special Requests   Final    NONE Performed at [facility], [address], [place], [place] [address]    Culture   Final    NO GROWTH Performed at [facility], [place] [address] [place], [place] [address]    Report Status 01/14/2021 FINAL  Final  Culture, blood (Routine X 2) w Reflex to ID Panel     Status: None (Preliminary result)   Collection Time: 01/13/21  1:54 AM   Specimen: BLOOD  Result Value Ref Range Status   Specimen Description BLOOD  RIGHT ARM  Final   Special Requests   Final    BOTTLES DRAWN AEROBIC AND ANAEROBIC Blood Culture adequate volume   Culture   Final    NO GROWTH 2 DAYS Performed at [facility], [address], [place], [place] [address]    Report Status PENDING  Incomplete  Resp Panel by RT-PCR (Flu A&B, Covid)     Status: None   Collection Time: 01/13/21  1:56 AM   Specimen: Nasopharyngeal(NP) swabs in vial transport medium  Result Value Ref Range Status   SARS Coronavirus 2 by RT PCR NEGATIVE NEGATIVE Final    Comment: (NOTE) SARS-CoV-2 target nucleic acids are NOT DETECTED.  The SARS-CoV-2 RNA is generally detectable in upper respiratory specimens during the acute phase of infection. The lowest concentration of SARS-CoV-2 viral copies this assay can detect is 138 copies/mL. A negative result does not preclude SARS-Cov-2 infection and should not be  used as the sole basis for treatment or other patient management decisions. A negative result may occur with  improper specimen collection/handling, submission of specimen other than nasopharyngeal swab, presence of viral mutation(s) within the areas targeted by this assay, and inadequate number of viral copies(<138 copies/mL). A negative result must be combined with clinical observations, patient history, and epidemiological information. The expected result is Negative.  Fact Sheet for Patients:  EntrepreneurPulse.com.au  Fact Sheet for Healthcare Providers:  IncredibleEmployment.be  This test is no t yet approved or cleared by the [place] FDA and  has been authorized for detection and/or diagnosis of SARS-CoV-2 by FDA under an Emergency Use Authorization (EUA). This EUA will remain  in effect (meaning this test can be used) for the duration of the COVID-19 declaration under Section 564(b)(1) of the Act, 21 U.S.C.section 360bbb-3(b)(1), unless the authorization is terminated  or revoked sooner.       Influenza A by PCR NEGATIVE NEGATIVE Final   Influenza B by PCR NEGATIVE NEGATIVE Final    Comment: (NOTE) The Xpert Xpress SARS-CoV-2/FLU/RSV plus assay is intended as an aid in  the diagnosis of influenza from Nasopharyngeal swab specimens and should not be used as a sole basis for treatment. Nasal washings and aspirates are unacceptable for Xpert Xpress SARS-CoV-2/FLU/RSV testing.  Fact Sheet for Patients: EntrepreneurPulse.com.au  Fact Sheet for Healthcare Providers: IncredibleEmployment.be  This test is not yet approved or cleared by the [place] FDA and has been authorized for detection and/or diagnosis of SARS-CoV-2 by FDA under an Emergency Use Authorization (EUA). This EUA will remain in effect (meaning this test can be used) for the duration of the COVID-19 declaration under Section  564(b)(1) of the Act, 21 U.S.C. section 360bbb-3(b)(1), unless the authorization is terminated or revoked.  Performed at [facility], [address] [place], [place] [address]   Culture, blood (Routine X 2) w Reflex to ID Panel     Status: None (Preliminary result)   Collection Time: 01/13/21  2:46 AM   Specimen: BLOOD  Result Value Ref Range Status   Specimen Description BLOOD RIGHT ANTECUBITAL  Final   Special Requests   Final    BOTTLES DRAWN AEROBIC AND ANAEROBIC Blood Culture adequate volume   Culture   Final    NO GROWTH 2 DAYS Performed at [facility], [address] [place], [place] [address]    Report Status PENDING  Incomplete  Respiratory (~20 pathogens) panel by PCR     Status: None   Collection Time: 01/13/21  8:25 AM   Specimen: Nasopharyngeal Swab; Respiratory  Result Value Ref Range Status   Adenovirus NOT DETECTED NOT DETECTED Final   Coronavirus 229E NOT DETECTED NOT DETECTED Final    Comment: (NOTE) The Coronavirus on the Respiratory Panel, DOES NOT test for the novel  Coronavirus (2019 nCoV)    Coronavirus HKU1 NOT DETECTED NOT DETECTED Final   Coronavirus NL63 NOT DETECTED NOT DETECTED Final   Coronavirus OC43 NOT DETECTED NOT DETECTED Final   Metapneumovirus NOT DETECTED NOT DETECTED Final   Rhinovirus / Enterovirus NOT DETECTED NOT DETECTED Final   Influenza A NOT DETECTED NOT DETECTED Final   Influenza B NOT DETECTED NOT DETECTED Final   Parainfluenza Virus 1 NOT DETECTED NOT DETECTED Final   Parainfluenza Virus 2 NOT DETECTED NOT DETECTED Final   Parainfluenza Virus 3 NOT DETECTED NOT DETECTED Final   Parainfluenza Virus 4 NOT DETECTED NOT DETECTED Final   Respiratory Syncytial Virus NOT DETECTED NOT DETECTED Final   Bordetella pertussis NOT DETECTED NOT DETECTED Final   Bordetella Parapertussis NOT DETECTED NOT DETECTED Final   Chlamydophila pneumoniae NOT DETECTED NOT DETECTED Final   Mycoplasma pneumoniae NOT DETECTED NOT  DETECTED Final    Comment: Performed at [facility], [place]. [address], [place], [place] [address]     Labs: BNP (last 3 results) Recent Labs    02/04/20 0931 03/12/20 1643 03/21/20 0310  BNP 3,406.5* >4,500.0* 123XX123*   Basic Metabolic Panel: Recent Labs  Lab 01/13/21 0002 01/13/21 0511 01/14/21 0508 01/15/21 0427  NA 140  --  135 137  K 4.7  --  6.0* 5.0  CL 95*  --  93* 95*  CO2 26  --  24 26  GLUCOSE 91  --  78 146*  BUN 42*  --  61* 39*  CREATININE 10.79* 10.98* 12.22* 8.13*  CALCIUM 9.2  --  8.9 9.1  PHOS  --   --   --  5.8*   Liver Function Tests: Recent Labs  Lab 01/13/21 0002 01/15/21 0427  AST 15  --   ALT  10  --   ALKPHOS 156*  --   BILITOT 1.5*  --   PROT 8.4*  --   ALBUMIN 4.0 3.7   Recent Labs  Lab 01/13/21 0002  LIPASE 45   No results for input(s): AMMONIA in the last 168 hours. CBC: Recent Labs  Lab 01/13/21 0002 01/13/21 0511 01/14/21 0508  WBC 9.4 8.7 9.8  NEUTROABS  --   --  7.1  HGB 10.9* 10.3* 10.2*  HCT 33.7* 32.0* 31.9*  MCV 95.2 95.8 95.5  PLT 126* 115* 102*   Cardiac Enzymes: No results for input(s): CKTOTAL, CKMB, CKMBINDEX, TROPONINI in the last 168 hours. BNP: Invalid input(s): POCBNP CBG: Recent Labs  Lab 01/14/21 0946  GLUCAP 136*   D-Dimer No results for input(s): DDIMER in the last 72 hours. Hgb A1c No results for input(s): HGBA1C in the last 72 hours. Lipid Profile No results for input(s): CHOL, HDL, LDLCALC, TRIG, CHOLHDL, LDLDIRECT in the last 72 hours. Thyroid function studies No results for input(s): TSH, T4TOTAL, T3FREE, THYROIDAB in the last 72 hours.  Invalid input(s): FREET3 Anemia work up No results for input(s): VITAMINB12, FOLATE, FERRITIN, TIBC, IRON, RETICCTPCT in the last 72 hours. Urinalysis    Component Value Date/Time   COLORURINE YELLOW (A) 01/13/2021 0153   APPEARANCEUR CLEAR (A) 01/13/2021 0153   LABSPEC 1.012 01/13/2021 0153   PHURINE 9.0 (H) 01/13/2021 0153   GLUCOSEU  150 (A) 01/13/2021 0153   HGBUR NEGATIVE 01/13/2021 0153   BILIRUBINUR NEGATIVE 01/13/2021 0153   KETONESUR NEGATIVE 01/13/2021 0153   PROTEINUR >=300 (A) 01/13/2021 0153   NITRITE NEGATIVE 01/13/2021 0153   LEUKOCYTESUR NEGATIVE 01/13/2021 0153   Sepsis Labs Invalid input(s): PROCALCITONIN,  WBC,  LACTICIDVEN Microbiology Recent Results (from the past 240 hour(s))  Urine Culture     Status: None   Collection Time: 01/13/21  1:53 AM   Specimen: Urine, Random  Result Value Ref Range Status   Specimen Description   Final    URINE, RANDOM Performed at [facility], [address], [place], [place] [address]    Special Requests   Final    NONE Performed at [facility], [address] [place], [place] [address]    Culture   Final    NO GROWTH Performed at [facility], [place] [address] [place], [place] [address]    Report Status 01/14/2021 FINAL  Final  Culture, blood (Routine X 2) w Reflex to ID Panel     Status: None (Preliminary result)   Collection Time: 01/13/21  1:54 AM   Specimen: BLOOD  Result Value Ref Range Status   Specimen Description BLOOD  RIGHT ARM  Final   Special Requests   Final    BOTTLES DRAWN AEROBIC AND ANAEROBIC Blood Culture adequate volume   Culture   Final    NO GROWTH 2 DAYS Performed at [facility], [address], [place],  [address]    Report Status PENDING  Incomplete  Resp Panel by RT-PCR (Flu A&B, Covid)     Status: None   Collection Time: 01/13/21  1:56 AM   Specimen: Nasopharyngeal(NP) swabs in vial transport medium  Result Value Ref Range Status   SARS Coronavirus 2 by RT PCR NEGATIVE NEGATIVE Final    Comment: (NOTE) SARS-CoV-2 target nucleic acids are NOT DETECTED.  The SARS-CoV-2 RNA is generally detectable in upper respiratory specimens during the acute phase of infection. The lowest concentration of SARS-CoV-2 viral copies this assay can detect  is 138 copies/mL. A negative result does  not preclude SARS-Cov-2 infection and should not be used as the sole basis for treatment or other patient management decisions. A negative result may occur with  improper specimen collection/handling, submission of specimen other than nasopharyngeal swab, presence of viral mutation(s) within the areas targeted by this assay, and inadequate number of viral copies(<138 copies/mL). A negative result must be combined with clinical observations, patient history, and epidemiological information. The expected result is Negative.  Fact Sheet for Patients:  EntrepreneurPulse.com.au  Fact Sheet for Healthcare Providers:  IncredibleEmployment.be  This test is no t yet approved or cleared by the [place] FDA and  has been authorized for detection and/or diagnosis of SARS-CoV-2 by FDA under an Emergency Use Authorization (EUA). This EUA will remain  in effect (meaning this test can be used) for the duration of the COVID-19 declaration under Section 564(b)(1) of the Act, 21 U.S.C.section 360bbb-3(b)(1), unless the authorization is terminated  or revoked sooner.       Influenza A by PCR NEGATIVE NEGATIVE Final   Influenza B by PCR NEGATIVE NEGATIVE Final    Comment: (NOTE) The Xpert Xpress SARS-CoV-2/FLU/RSV plus assay is intended as an aid in the diagnosis of influenza from Nasopharyngeal swab specimens and should not be used as a sole basis for treatment. Nasal washings and aspirates are unacceptable for Xpert Xpress SARS-CoV-2/FLU/RSV testing.  Fact Sheet for Patients: EntrepreneurPulse.com.au  Fact Sheet for Healthcare Providers: IncredibleEmployment.be  This test is not yet approved or cleared by the [place] FDA and has been authorized for detection and/or diagnosis of SARS-CoV-2 by FDA under an Emergency Use Authorization (EUA). This EUA will remain in effect (meaning this test can be used) for the  duration of the COVID-19 declaration under Section 564(b)(1) of the Act, 21 U.S.C. section 360bbb-3(b)(1), unless the authorization is terminated or revoked.  Performed at [facility], [address] [place], [place] [address]   Culture, blood (Routine X 2) w Reflex to ID Panel     Status: None (Preliminary result)   Collection Time: 01/13/21  2:46 AM   Specimen: BLOOD  Result Value Ref Range Status   Specimen Description BLOOD RIGHT ANTECUBITAL  Final   Special Requests   Final    BOTTLES DRAWN AEROBIC AND ANAEROBIC Blood Culture adequate volume   Culture   Final    NO GROWTH 2 DAYS Performed at [facility], [address], [place], [place] [address]    Report Status PENDING  Incomplete  Respiratory (~20 pathogens) panel by PCR     Status: None   Collection Time: 01/13/21  8:25 AM   Specimen: Nasopharyngeal Swab; Respiratory  Result Value Ref Range Status   Adenovirus NOT DETECTED NOT DETECTED Final   Coronavirus 229E NOT DETECTED NOT DETECTED Final    Comment: (NOTE) The Coronavirus on the Respiratory Panel, DOES NOT test for the novel  Coronavirus (2019 nCoV)    Coronavirus HKU1 NOT DETECTED NOT DETECTED Final   Coronavirus NL63 NOT DETECTED NOT DETECTED Final   Coronavirus OC43 NOT DETECTED NOT DETECTED Final   Metapneumovirus NOT DETECTED NOT DETECTED Final   Rhinovirus / Enterovirus NOT DETECTED NOT DETECTED Final   Influenza A NOT DETECTED NOT DETECTED Final   Influenza B NOT DETECTED NOT DETECTED Final   Parainfluenza Virus 1 NOT DETECTED NOT DETECTED Final   Parainfluenza Virus 2 NOT DETECTED NOT DETECTED Final   Parainfluenza Virus 3 NOT DETECTED NOT DETECTED Final   Parainfluenza  Virus 4 NOT DETECTED NOT DETECTED Final   Respiratory Syncytial Virus NOT DETECTED NOT DETECTED Final   Bordetella pertussis NOT DETECTED NOT DETECTED Final   Bordetella Parapertussis NOT DETECTED NOT DETECTED Final   Chlamydophila pneumoniae NOT DETECTED NOT  DETECTED Final   Mycoplasma pneumoniae NOT DETECTED NOT DETECTED Final    Comment: Performed at [facility], [place] [address] [place], [place] [address]    Time coordinating discharge: Over 30 minutes  SIGNED:  Lorella Nimrod, MD  [facility] 01/15/2021, 12:01 PM  If 7PM-7AM, please contact night-coverage [email or web address]  This record has been created using Systems analyst. Errors have been sought and corrected,but may not always be located. Such creation errors do not reflect on the standard of care.

## 2021-01-15 NOTE — TOC Transition Note (Signed)
Transition of Care [facility]) - CM/SW Discharge Note   Patient Details  Name: Jordan [name] MRN: [id number] Date of Birth: 02-20-1966  [facility] CM/SW Contact:  Beverly Sessions, RN Phone Number: 01/15/2021, 1:52 PM   Clinical Narrative:    Patient to discharge today.  Elvera Bicker dialysis liaison notified of discharge Patient states that he does not have transportation home.  Typically he uses medicaid transport, and all of his family lives in [place].   [facility] transport arranged for 330  Patient does not have is portable O2 available.  Ronda from [place] has delivered to room         Patient Goals and CMS Choice        Discharge Placement                       Discharge Plan and Services                                     Social Determinants of Health (SDOH) Interventions     Readmission Risk Interventions No flowsheet data found.

## 2021-01-15 NOTE — Progress Notes (Signed)
Patient awaiting transport for discharge to home. Patient given all discharge instructions, patient stated understanding and able to teach back instructions.  Patient to be sent home with all valuables.  IV site d/ced.  No acute distress noted.

## 2021-01-15 NOTE — Progress Notes (Signed)
Jordan [name]  MRN: [id number]  DOB/AGE: 54-Aug-1967 54 y.o.  Primary Care Physician:Pcp, No  Admit date: 01/12/2021  Chief Complaint:  Chief Complaint  Patient presents with  . Abdominal Pain  . Emesis  . Diarrhea    S-Pt presented on  01/12/2021 with  Chief Complaint  Patient presents with  . Abdominal Pain  . Emesis  . Diarrhea  .  Patient is known to our practice from earlier admission In 2021.   Patient is a 55 year old African-American male with a past medical history of ESRD, patient is a Monday Wednesday Friday schedule, hypertension, atrial fibrillation and COPD who came to the ER with chief complaint of malaise, chills and fever. Upon evaluation in the ER patient had chest x-ray done which showed patient had patchy perihilar infiltrates.  Patient was admitted with multifocal pneumonia.   Patient seen laying in bed Alert and oriented States his dialysis treatment yesterday was bad due to itching and pain   Medications . calcium acetate  1,334 mg Oral TID WC  . Chlorhexidine Gluconate Cloth  6 each Topical Q0600  . cinacalcet  90 mg Oral QPM  . epoetin (EPOGEN/PROCRIT) injection  4,000 Units Intravenous Q M,W,F-HD  . heparin  5,000 Units Subcutaneous Q8H  . hydrOXYzine  25 mg Oral Q6H  . senna-docusate  1 tablet Oral BID  . sodium zirconium cyclosilicate  10 g Oral Daily  . thiamine  100 mg Oral Q M,W,F-HD  . triamcinolone cream   Topical TID         [id number] from the symptoms mentioned above,there are no other symptoms referable to all systems reviewed.  Physical Exam: Vital signs in last 24 hours: Temp:  [98 F (36.7 C)-98.8 F (37.1 C)] 98 F (36.7 C) (04/26 0756) Pulse Rate:  [90-109] 90 (04/26 0756) Resp:  [14-20] 14 (04/26 0756) BP: (130-180)/(82-105) 156/101 (04/26 0756) SpO2:  [90 %-100 %] 100 % (04/26 0756) Weight change:  Last BM Date: 01/11/21  Intake/Output from previous day: 04/25 0701 - 04/26 0700 In: 440 [P.O.:440] Out:  2000  No intake/output data recorded.   Physical Exam:  General- pt is awake,alert, oriented to time place and person  Resp- No acute REsp distress, Rhonchi present  CVS- S1S2 regular in rate and rhythm  GIT- BS+, soft, Non tender , Non distended  EXT- No LE Edema,  No Cyanosis  SKIN- Buttock and BLL healed lesions  Access- LUE AVF  Lab Results:  CBC  Recent Labs    01/13/21 0511 01/14/21 0508  WBC 8.7 9.8  HGB 10.3* 10.2*  HCT 32.0* 31.9*  PLT 115* 102*    BMET  Recent Labs    01/14/21 0508 01/15/21 0427  NA 135 137  K 6.0* 5.0  CL 93* 95*  CO2 24 26  GLUCOSE 78 146*  BUN 61* 39*  CREATININE 12.22* 8.13*  CALCIUM 8.9 9.1      Most recent Creatinine trend  Lab Results  Component Value Date   CREATININE 8.13 (H) 01/15/2021   CREATININE 12.22 (H) 01/14/2021   CREATININE 10.98 (H) 01/13/2021      MICRO   Recent Results (from the past 240 hour(s))  Urine Culture     Status: None   Collection Time: 01/13/21  1:53 AM   Specimen: Urine, Random  Result Value Ref Range Status   Specimen Description   Final    URINE, RANDOM Performed at [facility], [address], [place], [place] [address]  Special Requests   Final    NONE Performed at [facility], [address], [place], [place] [address]    Culture   Final    NO GROWTH Performed at [facility], [place] [address], [place], [place] [address]    Report Status 01/14/2021 FINAL  Final  Culture, blood (Routine X 2) w Reflex to ID Panel     Status: None (Preliminary result)   Collection Time: 01/13/21  1:54 AM   Specimen: BLOOD  Result Value Ref Range Status   Specimen Description BLOOD  RIGHT ARM  Final   Special Requests   Final    BOTTLES DRAWN AEROBIC AND ANAEROBIC Blood Culture adequate volume   Culture   Final    NO GROWTH 2 DAYS Performed at [facility], [address] [place], [place] [address]    Report Status PENDING  Incomplete  Resp  Panel by RT-PCR (Flu A&B, Covid)     Status: None   Collection Time: 01/13/21  1:56 AM   Specimen: Nasopharyngeal(NP) swabs in vial transport medium  Result Value Ref Range Status   SARS Coronavirus 2 by RT PCR NEGATIVE NEGATIVE Final    Comment: (NOTE) SARS-CoV-2 target nucleic acids are NOT DETECTED.  The SARS-CoV-2 RNA is generally detectable in upper respiratory specimens during the acute phase of infection. The lowest concentration of SARS-CoV-2 viral copies this assay can detect is 138 copies/mL. A negative result does not preclude SARS-Cov-2 infection and should not be used as the sole basis for treatment or other patient management decisions. A negative result may occur with  improper specimen collection/handling, submission of specimen other than nasopharyngeal swab, presence of viral mutation(s) within the areas targeted by this assay, and inadequate number of viral copies(<138 copies/mL). A negative result must be combined with clinical observations, patient history, and epidemiological information. The expected result is Negative.  Fact Sheet for Patients:  EntrepreneurPulse.com.au  Fact Sheet for Healthcare Providers:  IncredibleEmployment.be  This test is no t yet approved or cleared by the [place] FDA and  has been authorized for detection and/or diagnosis of SARS-CoV-2 by FDA under an Emergency Use Authorization (EUA). This EUA will remain  in effect (meaning this test can be used) for the duration of the COVID-19 declaration under Section 564(b)(1) of the Act, 21 U.S.C.section 360bbb-3(b)(1), unless the authorization is terminated  or revoked sooner.       Influenza A by PCR NEGATIVE NEGATIVE Final   Influenza B by PCR NEGATIVE NEGATIVE Final    Comment: (NOTE) The Xpert Xpress SARS-CoV-2/FLU/RSV plus assay is intended as an aid in the diagnosis of influenza from Nasopharyngeal swab specimens and should not be used as  a sole basis for treatment. Nasal washings and aspirates are unacceptable for Xpert Xpress SARS-CoV-2/FLU/RSV testing.  Fact Sheet for Patients: EntrepreneurPulse.com.au  Fact Sheet for Healthcare Providers: IncredibleEmployment.be  This test is not yet approved or cleared by the [place] FDA and has been authorized for detection and/or diagnosis of SARS-CoV-2 by FDA under an Emergency Use Authorization (EUA). This EUA will remain in effect (meaning this test can be used) for the duration of the COVID-19 declaration under Section 564(b)(1) of the Act, 21 U.S.C. section 360bbb-3(b)(1), unless the authorization is terminated or revoked.  Performed at [facility], [place] [place],  [address]   Culture, blood (Routine X 2) w Reflex to ID Panel     Status: None (Preliminary result)   Collection Time: 01/13/21  2:46 AM   Specimen: BLOOD  Result Value Ref Range Status   Specimen Description BLOOD RIGHT ANTECUBITAL  Final   Special Requests   Final    BOTTLES DRAWN AEROBIC AND ANAEROBIC Blood Culture adequate volume   Culture   Final    NO GROWTH 2 DAYS Performed at [facility], [address], [place], [place] [address]    Report Status PENDING  Incomplete  Respiratory (~20 pathogens) panel by PCR     Status: None   Collection Time: 01/13/21  8:25 AM   Specimen: Nasopharyngeal Swab; Respiratory  Result Value Ref Range Status   Adenovirus NOT DETECTED NOT DETECTED Final   Coronavirus 229E NOT DETECTED NOT DETECTED Final    Comment: (NOTE) The Coronavirus on the Respiratory Panel, DOES NOT test for the novel  Coronavirus (2019 nCoV)    Coronavirus HKU1 NOT DETECTED NOT DETECTED Final   Coronavirus NL63 NOT DETECTED NOT DETECTED Final   Coronavirus OC43 NOT DETECTED NOT DETECTED Final   Metapneumovirus NOT DETECTED NOT DETECTED Final   Rhinovirus / Enterovirus NOT DETECTED NOT DETECTED Final   Influenza  A NOT DETECTED NOT DETECTED Final   Influenza B NOT DETECTED NOT DETECTED Final   Parainfluenza Virus 1 NOT DETECTED NOT DETECTED Final   Parainfluenza Virus 2 NOT DETECTED NOT DETECTED Final   Parainfluenza Virus 3 NOT DETECTED NOT DETECTED Final   Parainfluenza Virus 4 NOT DETECTED NOT DETECTED Final   Respiratory Syncytial Virus NOT DETECTED NOT DETECTED Final   Bordetella pertussis NOT DETECTED NOT DETECTED Final   Bordetella Parapertussis NOT DETECTED NOT DETECTED Final   Chlamydophila pneumoniae NOT DETECTED NOT DETECTED Final   Mycoplasma pneumoniae NOT DETECTED NOT DETECTED Final    Comment: Performed at [facility], [place] [address], [place], [place] [address]         Impression:  Patient is a 55 year old freak American male with a past medical history of ESRD on hemodialysis, hypertension, defibrillation, COPD who is currently admitted with    Pneumonia   ESRD on dialysis [facility])   Essential hypertension   Anemia in ESRD (end-stage renal disease) ([place])   Chronic combined systolic (congestive) and diastolic (congestive) heart failure ([place])   Patient outpatient dialysis treatment as Monday Wednesday Friday schedule Optiflex 180 NR 4 hours treatment 67 kg dry weight 2K/2 calcium bath Left AV fistula 15-gauge needles [address]    1) ESRD with hyperkalemia on HD Will maintain outpatient schedule while admitted Received dialysis yesterday UF goal 2L achieved Next treatment scheduled for tomorrow Cleared from discharge from renal Resume outpatient dialysis at clinic  [facility] daily   2) HTN  Blood pressure elevated Lisinopril discontinued due to suspected allergic reaction; angioedema. Hydralazine PRN if needed   3) Anemia of chronic disease  CBC Latest Ref Rng & Units 01/14/2021 01/13/2021 01/13/2021  WBC 4.0 - 10.5 K/uL 9.8 8.7 9.4  Hemoglobin 13.0 - 17.0 g/dL 10.2(L) 10.3(L) 10.9(L)  Hematocrit 39.0 - 52.0 % 31.9(L) 32.0(L) 33.7(L)  Platelets 150 -  400 K/uL 102(L) 115(L) 126(L)     HGb at goal  Low dose EPO with dialysis  4) Secondary hyperparathyroidism -CKD Mineral-Bone Disorder  Lab Results  Component Value Date   PTH 669 (H) 02/20/2020   CALCIUM 9.1 01/15/2021   CAION 1.26 04/07/2020   PHOS 5.8 (H) 01/15/2021   Secondary Hyperparathyroidism present Phosphorus elevated Calcium Acetate with meals     01/15/2021, 9:48 AM  [facility]

## 2021-01-16 DIAGNOSIS — N186 End stage renal disease: Secondary | ICD-10-CM | POA: Diagnosis not present

## 2021-01-16 DIAGNOSIS — Z992 Dependence on renal dialysis: Secondary | ICD-10-CM | POA: Diagnosis not present

## 2021-01-16 DIAGNOSIS — N2581 Secondary hyperparathyroidism of renal origin: Secondary | ICD-10-CM | POA: Diagnosis not present

## 2021-01-18 LAB — CULTURE, BLOOD (ROUTINE X 2)
Culture: NO GROWTH
Culture: NO GROWTH
Special Requests: ADEQUATE
Special Requests: ADEQUATE

## 2021-01-19 DIAGNOSIS — I129 Hypertensive chronic kidney disease with stage 1 through stage 4 chronic kidney disease, or unspecified chronic kidney disease: Secondary | ICD-10-CM | POA: Diagnosis not present

## 2021-01-19 DIAGNOSIS — N186 End stage renal disease: Secondary | ICD-10-CM | POA: Diagnosis not present

## 2021-01-19 DIAGNOSIS — Z992 Dependence on renal dialysis: Secondary | ICD-10-CM | POA: Diagnosis not present

## 2021-01-20 DIAGNOSIS — I509 Heart failure, unspecified: Secondary | ICD-10-CM | POA: Diagnosis not present

## 2021-01-23 DIAGNOSIS — Z992 Dependence on renal dialysis: Secondary | ICD-10-CM | POA: Diagnosis not present

## 2021-01-23 DIAGNOSIS — N186 End stage renal disease: Secondary | ICD-10-CM | POA: Diagnosis not present

## 2021-01-23 DIAGNOSIS — N2581 Secondary hyperparathyroidism of renal origin: Secondary | ICD-10-CM | POA: Diagnosis not present

## 2021-01-25 DIAGNOSIS — N186 End stage renal disease: Secondary | ICD-10-CM | POA: Diagnosis not present

## 2021-01-25 DIAGNOSIS — N2581 Secondary hyperparathyroidism of renal origin: Secondary | ICD-10-CM | POA: Diagnosis not present

## 2021-01-25 DIAGNOSIS — Z992 Dependence on renal dialysis: Secondary | ICD-10-CM | POA: Diagnosis not present

## 2021-01-28 DIAGNOSIS — Z992 Dependence on renal dialysis: Secondary | ICD-10-CM | POA: Diagnosis not present

## 2021-01-28 DIAGNOSIS — N186 End stage renal disease: Secondary | ICD-10-CM | POA: Diagnosis not present

## 2021-01-28 DIAGNOSIS — N2581 Secondary hyperparathyroidism of renal origin: Secondary | ICD-10-CM | POA: Diagnosis not present

## 2021-02-01 ENCOUNTER — Other Ambulatory Visit: Payer: Self-pay

## 2021-02-01 ENCOUNTER — Emergency Department
Admission: EM | Admit: 2021-02-01 | Discharge: 2021-02-01 | Disposition: A | Discharge status: Home / Self Care | Payer: Medicare Other | Attending: Emergency Medicine | Admitting: Emergency Medicine

## 2021-02-01 ENCOUNTER — Emergency Department: Payer: Medicare Other

## 2021-02-01 DIAGNOSIS — I5042 Chronic combined systolic (congestive) and diastolic (congestive) heart failure: Secondary | ICD-10-CM | POA: Insufficient documentation

## 2021-02-01 DIAGNOSIS — T782XXA Anaphylactic shock, unspecified, initial encounter: Secondary | ICD-10-CM | POA: Diagnosis not present

## 2021-02-01 DIAGNOSIS — I499 Cardiac arrhythmia, unspecified: Secondary | ICD-10-CM | POA: Diagnosis not present

## 2021-02-01 DIAGNOSIS — N186 End stage renal disease: Secondary | ICD-10-CM | POA: Diagnosis not present

## 2021-02-01 DIAGNOSIS — I132 Hypertensive heart and chronic kidney disease with heart failure and with stage 5 chronic kidney disease, or end stage renal disease: Secondary | ICD-10-CM | POA: Diagnosis not present

## 2021-02-01 DIAGNOSIS — L298 Other pruritus: Secondary | ICD-10-CM

## 2021-02-01 DIAGNOSIS — J439 Emphysema, unspecified: Secondary | ICD-10-CM | POA: Diagnosis not present

## 2021-02-01 DIAGNOSIS — L299 Pruritus, unspecified: Secondary | ICD-10-CM | POA: Diagnosis not present

## 2021-02-01 DIAGNOSIS — R6889 Other general symptoms and signs: Secondary | ICD-10-CM | POA: Diagnosis not present

## 2021-02-01 DIAGNOSIS — F17210 Nicotine dependence, cigarettes, uncomplicated: Secondary | ICD-10-CM | POA: Diagnosis not present

## 2021-02-01 DIAGNOSIS — N2581 Secondary hyperparathyroidism of renal origin: Secondary | ICD-10-CM | POA: Diagnosis not present

## 2021-02-01 DIAGNOSIS — R079 Chest pain, unspecified: Secondary | ICD-10-CM

## 2021-02-01 DIAGNOSIS — Z743 Need for continuous supervision: Secondary | ICD-10-CM | POA: Diagnosis not present

## 2021-02-01 DIAGNOSIS — L2989 Other pruritus: Secondary | ICD-10-CM

## 2021-02-01 DIAGNOSIS — Z992 Dependence on renal dialysis: Secondary | ICD-10-CM | POA: Diagnosis not present

## 2021-02-01 DIAGNOSIS — R0789 Other chest pain: Secondary | ICD-10-CM | POA: Diagnosis not present

## 2021-02-01 LAB — BASIC METABOLIC PANEL
Anion gap: 19 — ABNORMAL HIGH (ref 5–15)
BUN: 73 mg/dL — ABNORMAL HIGH (ref 6–20)
CO2: 23 mmol/L (ref 22–32)
Calcium: 10 mg/dL (ref 8.9–10.3)
Chloride: 97 mmol/L — ABNORMAL LOW (ref 98–111)
Creatinine, Ser: 13.61 mg/dL — ABNORMAL HIGH (ref 0.61–1.24)
GFR, Estimated: 4 mL/min — ABNORMAL LOW (ref >60)
Glucose, Bld: 92 mg/dL (ref 70–99)
Potassium: 4.3 mmol/L (ref 3.5–5.1)
Sodium: 139 mmol/L (ref 135–145)

## 2021-02-01 LAB — CBC WITH DIFFERENTIAL/PLATELET
Abs Immature Granulocytes: 0.05 10*3/uL (ref 0.00–0.07)
Basophils Absolute: 0.1 10*3/uL (ref 0.0–0.1)
Basophils Relative: 1 %
Eosinophils Absolute: 0.2 10*3/uL (ref 0.0–0.5)
Eosinophils Relative: 2 %
HCT: 29.5 % — ABNORMAL LOW (ref 39.0–52.0)
Hemoglobin: 9.4 g/dL — ABNORMAL LOW (ref 13.0–17.0)
Immature Granulocytes: 1 %
Lymphocytes Relative: 10 %
Lymphs Abs: 0.8 10*3/uL (ref 0.7–4.0)
MCH: 30 pg (ref 26.0–34.0)
MCHC: 31.9 g/dL (ref 30.0–36.0)
MCV: 94.2 fL (ref 80.0–100.0)
Monocytes Absolute: 0.7 10*3/uL (ref 0.1–1.0)
Monocytes Relative: 8 %
Neutro Abs: 6.4 10*3/uL (ref 1.7–7.7)
Neutrophils Relative %: 78 %
Platelets: 131 10*3/uL — ABNORMAL LOW (ref 150–400)
RBC: 3.13 MIL/uL — ABNORMAL LOW (ref 4.22–5.81)
RDW: 15.1 % (ref 11.5–15.5)
WBC: 8.2 10*3/uL (ref 4.0–10.5)
nRBC: 0 % (ref 0.0–0.2)

## 2021-02-01 LAB — TROPONIN I (HIGH SENSITIVITY)
Troponin I (High Sensitivity): 81 ng/L — ABNORMAL HIGH (ref <18)
Troponin I (High Sensitivity): 81 ng/L — ABNORMAL HIGH (ref <18)

## 2021-02-01 IMAGING — CR DG CHEST 2V
2 series · 2 of 2 positions shown · non-contrast
Comparison: [DATE]

CLINICAL DATA: Right-sided chest pain

EXAM:
CHEST - 2 VIEW

[chest pa]
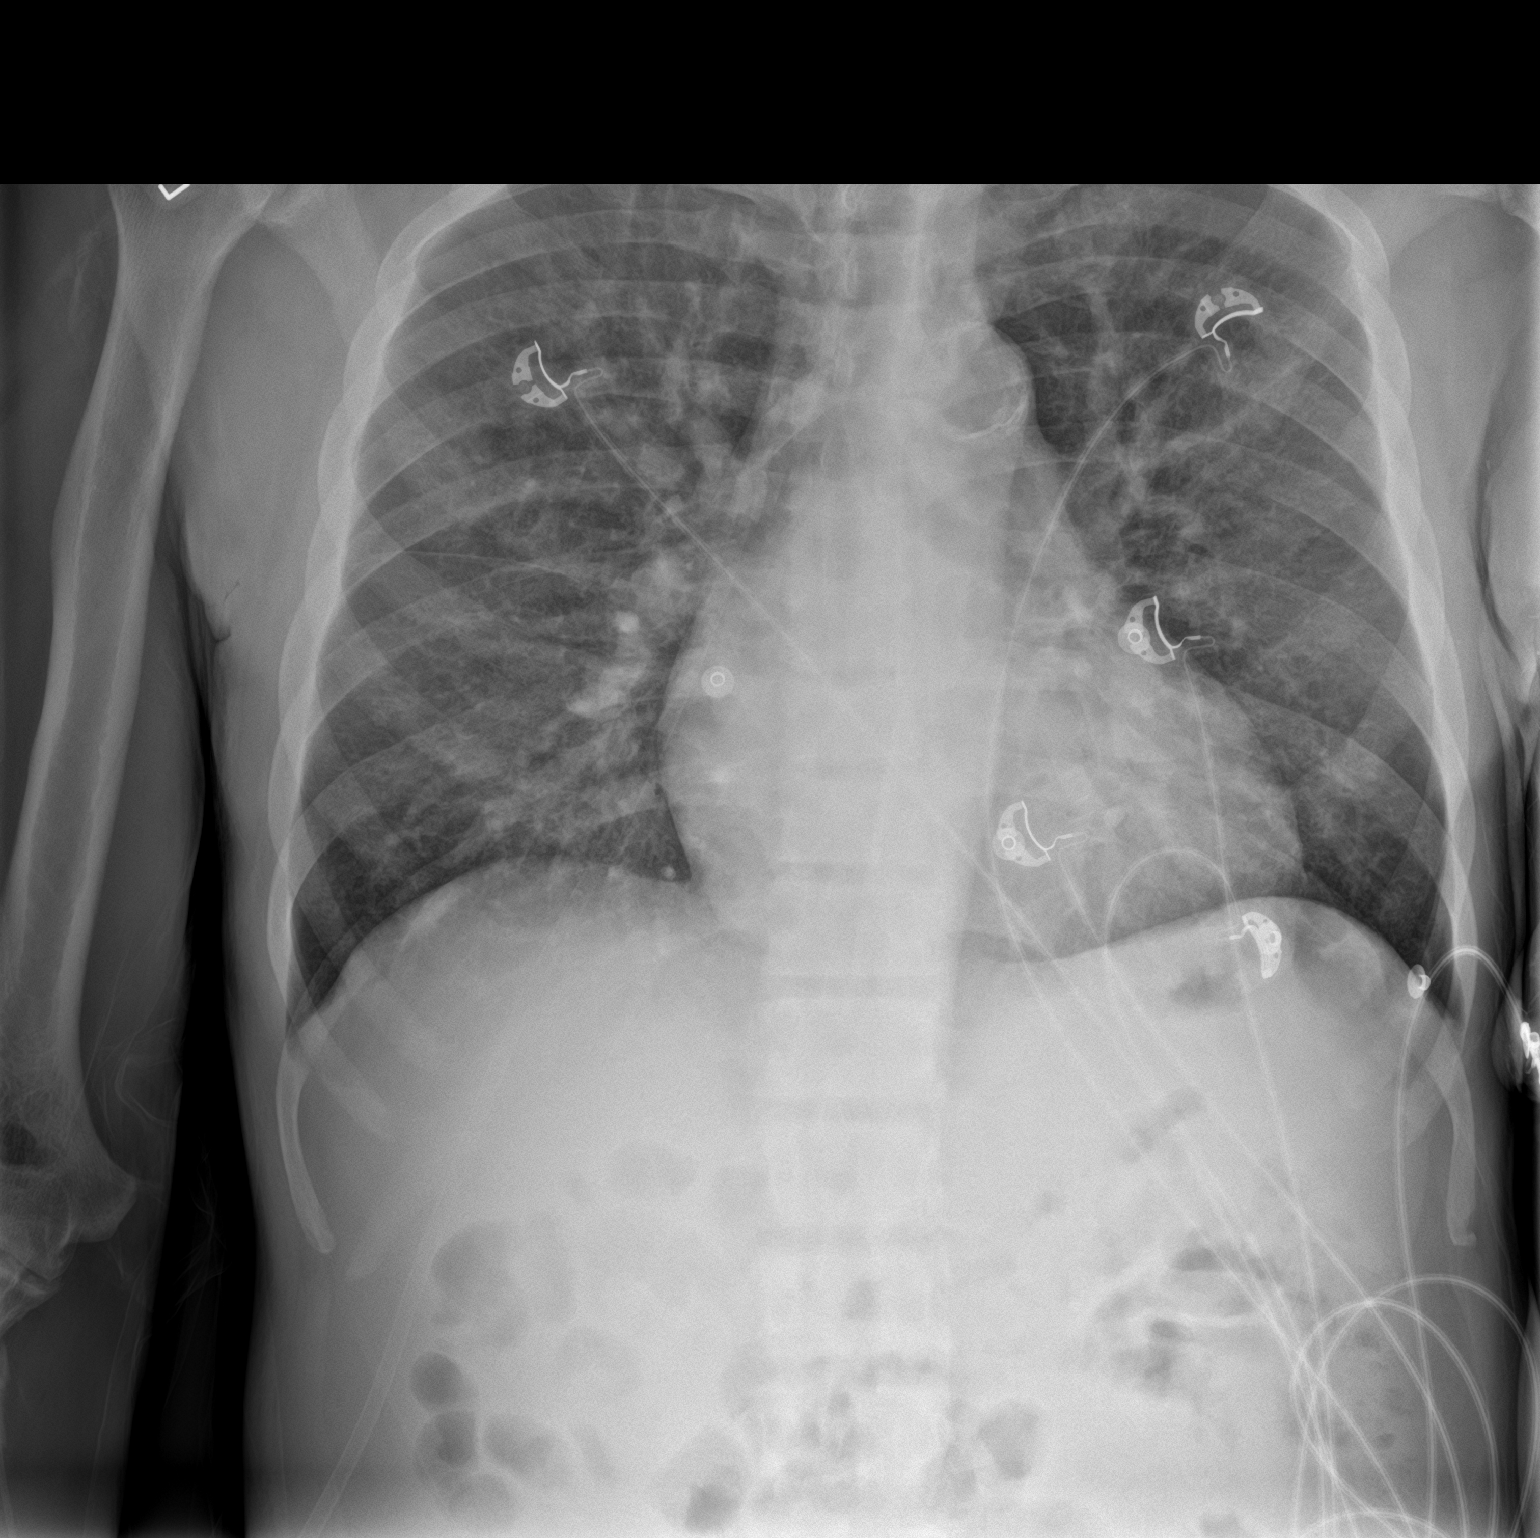

[chest lat]
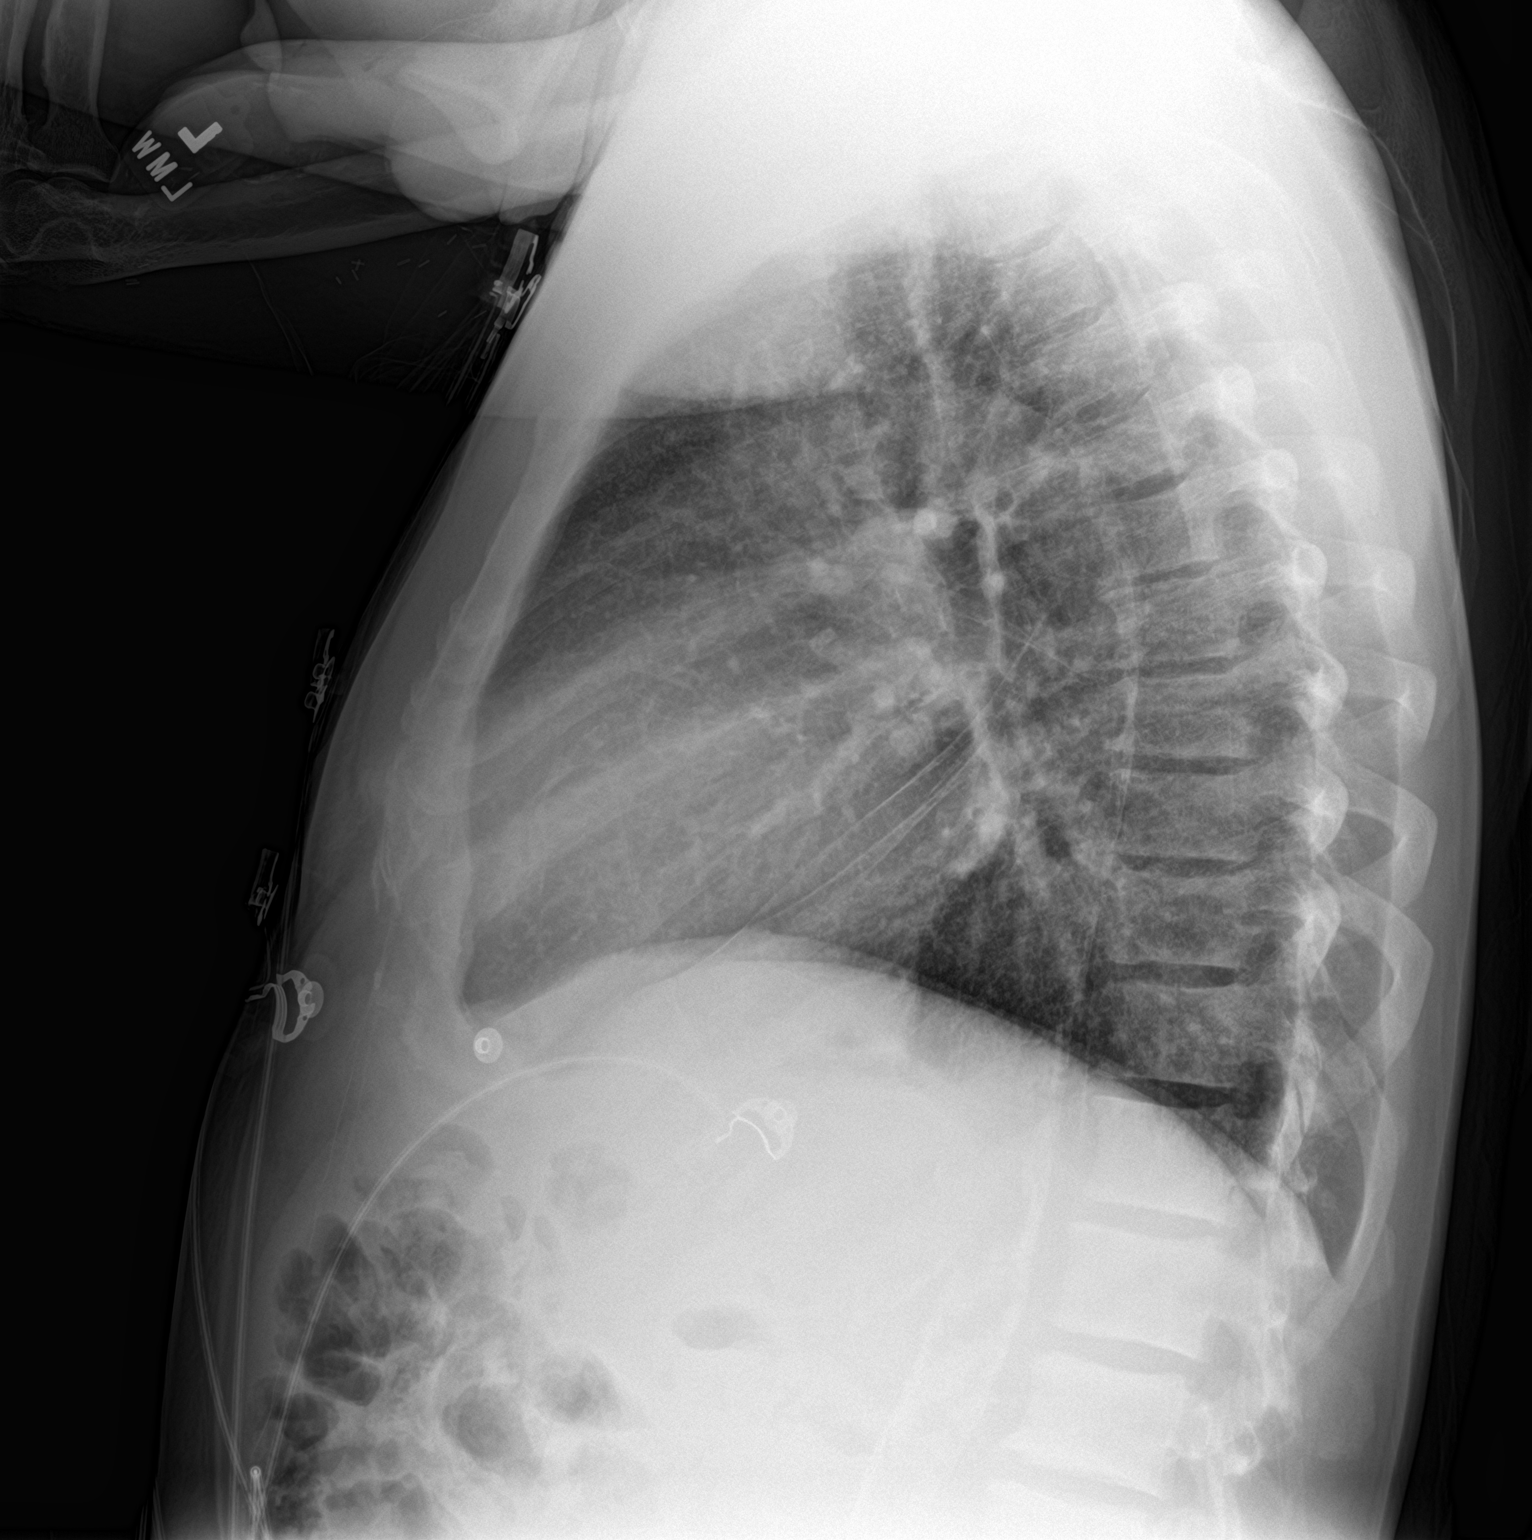

[2 of 2 positions shown; findings below may reference images not displayed]

FINDINGS: Normal heart size and mediastinal contours. Atheromatous
calcification. Interstitial prominence which is similar to prior.
Emphysema, airway thickening, and septal thickening has been noted
previously. Sclerotic bones in the setting of end-stage renal
disease.
IMPRESSION: Stable compared to priors.  No acute finding.

## 2021-02-01 MED ORDER — DIPHENHYDRAMINE HCL 50 MG/ML IJ SOLN
25.0000 mg | Freq: Once | INTRAMUSCULAR | Status: AC
  Administered 2021-02-01: 25 mg via INTRAVENOUS
  Filled 2021-02-01: qty 1

## 2021-02-01 MED ORDER — GABAPENTIN 600 MG PO TABS
300.0000 mg | ORAL_TABLET | Freq: Once | ORAL | Status: AC
  Administered 2021-02-01: 300 mg via ORAL
  Filled 2021-02-01: qty 1

## 2021-02-01 MED ORDER — FENTANYL CITRATE (PF) 100 MCG/2ML IJ SOLN
50.0000 ug | Freq: Once | INTRAMUSCULAR | Status: AC
Start: 2021-02-01 — End: 2021-02-01
  Administered 2021-02-01: 50 ug via INTRAVENOUS
  Filled 2021-02-01: qty 2

## 2021-02-01 MED ORDER — NALBUPHINE HCL 10 MG/ML IJ SOLN
5.0000 mg | Freq: Once | INTRAMUSCULAR | Status: AC
  Administered 2021-02-01: 5 mg via INTRAVENOUS
  Filled 2021-02-01: qty 0.5

## 2021-02-01 NOTE — ED Provider Notes (Signed)
[facility] Emergency Department Provider Note  ____________________________________________   Event Date/Time   First MD Initiated Contact with Patient 02/01/21 [phone]     (approximate)  I have reviewed the triage vital signs and the nursing notes.   HISTORY  Chief Complaint Chest Pain    HPI Jordan [name] is a 55 y.o. male with history of end-stage renal disease on hemodialysis Monday, Wednesday and Friday, hypertension, alcohol abuse who presents emergency department with right-sided chest pain that feels like a "knot".  Pain has been ongoing for 2 hours.  Came in by EMS.  No associated fevers, cough, worsening shortness of breath (reports he has chronic shortness of breath on 2 L of oxygen at home), vomiting, diaphoresis, dizziness.  Did have nausea.  Denies any fevers or cough.  Denies history of PE or DVT.  No lower extremity swelling or pain.  Also reports that he has been itching all over.  This is a chronic issue for him.  He took Atarax at home without relief.  No rash.  He has had areas that have been bleeding from scratching so much.  No new exposures.       States that he missed dialysis on Wednesday because he was itching too much.  He is scheduled for dialysis this morning.  Past Medical History:  Diagnosis Date  . Anemia   . Aortic atherosclerosis ([place]) 11/12/2019  . CKD (chronic kidney disease)    Stage 5  Dialysis - M/W/F in [place], [place]  . Dyspnea    tx with inhaler when sick  . ED (erectile dysfunction)   . Emphysema of lung ([place]) 11/12/2019  . ETOH abuse   . History of blood transfusion   . Hypertension   . Wears dentures     Patient Active Problem List   Diagnosis Date Noted  . Multifocal pneumonia 01/13/2021  . Current mild episode of major depressive disorder without prior episode ([place]) 04/09/2020  . Atrial fibrillation with RVR ([place]) 04/04/2020  . 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 Chronic combined systolic (congestive) and diastolic (congestive) heart failure ([place]) 02/03/2020  . Aortic atherosclerosis ([place]) 11/12/2019  . Emphysema of lung ([place]) 11/12/2019  . Anemia in ESRD (end-stage renal disease) ([place]) 05/17/2018  . ESRD on dialysis ([place]) 05/26/2017  . Essential hypertension 05/26/2017  . Moderate protein-calorie malnutrition ([place]) 08/13/2016  . Secondary hyperparathyroidism of renal origin ([place]) 08/13/2016    Past Surgical History:  Procedure Laterality Date  . BASCILIC VEIN TRANSPOSITION Left 08/07/2016   Procedure: LEFT BASILIC VEIN TRANSPOSITION;  Surgeon: Angelia Mould, MD;  Location: [place];  Service: Vascular;  Laterality: Left;  . CLOSED REDUCTION NASAL FRACTURE N/A 08/11/2019   Procedure: CLOSED REDUCTION NASAL FRACTURE WITH STABILIZATION;  Surgeon: Irene Limbo, MD;  Location: [place];  Service: Plastics;  Laterality: N/A;  . COLONOSCOPY N/A 08/12/2016   Procedure: COLONOSCOPY;  Surgeon: Otis Brace, MD;  Location: [place];  Service: Gastroenterology;  Laterality: N/A;  . ESOPHAGOGASTRODUODENOSCOPY N/A 08/12/2016   Procedure: ESOPHAGOGASTRODUODENOSCOPY (EGD);  Surgeon: Otis Brace, MD;  Location: [place];  Service: Gastroenterology;  Laterality: N/A;  . INSERTION OF DIALYSIS CATHETER N/A 08/07/2016   Procedure: INSERTION OF TUNNELED DIALYSIS CATHETER;  Surgeon: Angelia Mould, MD;  Location: [place];  Service: Vascular;  Laterality: N/A;  . LIGATION OF ARTERIOVENOUS  FISTULA Left 09/12/2016   Procedure: BANDING OF LEFT  ARTERIOVENOUS  FISTULA;  Surgeon: Angelia Mould, MD;  Location: [facility];  Service: Vascular;  Laterality: Left;  . LOWER EXTREMITY INTERVENTION Right 12/02/2018   Procedure: LOWER EXTREMITY INTERVENTION;  Surgeon: Algernon Huxley, MD;  Location: [facility];  Service: Cardiovascular;  Laterality: Right;    Prior to Admission medications   Medication Sig Start Date End Date Taking?  Authorizing Provider  AURYXIA 1 GM 210 MG(Fe) tablet Take 420 mg by mouth every Monday, Wednesday, and Friday with hemodialysis. 11/13/20   [provider]  calcium acetate (PHOSLO) 667 MG capsule Take 2 capsules (1,334 mg total) by mouth 3 (three) times daily with meals. 02/12/19   Fritzi Mandes, MD  cinacalcet (SENSIPAR) 90 MG tablet Take 1 tablet by mouth every evening. 11/28/20   [provider]  epoetin alfa (EPOGEN) 4000 UNIT/ML injection Inject 1 mL (4,000 Units total) into the vein every Monday, Wednesday, and Friday with hemodialysis. 06/22/20   Enzo Bi, MD  hydrALAZINE (APRESOLINE) 50 MG tablet Take 1 tablet (50 mg total) by mouth 3 (three) times daily as needed. 01/15/21   Lorella Nimrod, MD  hydrOXYzine (ATARAX/VISTARIL) 10 MG tablet Take 10 mg by mouth 3 (three) times daily as needed. 01/10/21   [provider]  patiromer Daryll Drown) 8.4 g packet Take by mouth. 03/13/20   [provider]  SARNA lotion Apply topically. 08/01/20   [provider]  sevelamer carbonate (RENVELA) 800 MG tablet Take 800 mg by mouth 3 (three) times daily with meals.    [provider]  thiamine 100 MG tablet Take 1 tablet (100 mg total) by mouth daily. Patient taking differently: Take 100 mg by mouth every Monday, Wednesday, and Friday with hemodialysis. 06/20/20   Enzo Bi, MD  triamcinolone cream (KENALOG) 0.1 % SMARTSIG:1 Application Topical 2-3 Times Daily 12/28/20   [provider]    Allergies Lisinopril  Family History  Problem Relation Age of Onset  . Diabetes Mother   . Kidney failure Mother   . Healthy Father   . Kidney failure Brother   . Healthy Sister   . Kidney disease Daughter   . Post-traumatic stress disorder Neg Hx   . Bladder Cancer Neg Hx   . Kidney cancer Neg Hx     Social History Social History   Tobacco Use  . Smoking status: Current Every Day Smoker    Packs/day: 0.50    Years: 40.00    Pack years: 20.00    Types:  Cigarettes  . Smokeless tobacco: Never Used  Vaping Use  . Vaping Use: Never used  Substance Use Topics  . Alcohol use: Yes    Alcohol/week: 21.0 standard drinks    Types: 21 Cans of beer per week    Comment: last drink 6 months ago  . Drug use: No    Review of Systems Constitutional: No fever. Eyes: No visual changes. ENT: No sore throat. Cardiovascular: +chest pain. Respiratory: Denies shortness of breath. Gastrointestinal: No nausea, vomiting, diarrhea. Genitourinary: Negative for dysuria. Musculoskeletal: Negative for back pain. Skin: Negative for rash. Neurological: Negative for focal weakness or numbness.  ____________________________________________   PHYSICAL EXAM:  VITAL SIGNS: ED Triage Vitals  Enc Vitals Group     BP 02/01/21 0437 (!) 153/87     Pulse Rate 02/01/21 0437 (!) 107     Resp 02/01/21 0437 (!) 24     Temp 02/01/21 0441 97.9 F (36.6 C)     Temp Source 02/01/21 0441 Oral     SpO2 02/01/21 0437 98 %  Weight 02/01/21 0430 150 lb (68 kg)     Height 02/01/21 0430 '5\' 3"'$  (1.6 m)     Head Circumference --      Peak Flow --      Pain Score 02/01/21 0430 8     Pain Loc --      Pain Edu? --      Excl. in [place]? --    CONSTITUTIONAL: Alert and oriented and responds appropriately to questions.  Chronically ill-appearing HEAD: Normocephalic, excoriations noted to the posterior scalp EYES: Conjunctivae clear, pupils appear equal, EOM appear intact ENT: normal nose; moist mucous membranes, no angioedema, normal phonation NECK: Supple, normal ROM CARD: Giller and tachycardic; S1 and S2 appreciated; no murmurs, no clicks, no rubs, no gallops CHEST: Tender palpation over the right chest wall without crepitus, ecchymosis, redness, warmth, deformity RESP: Normal chest excursion without splinting or tachypnea; breath sounds clear and equal bilaterally; no wheezes, no rhonchi, no rales, no hypoxia or respiratory distress, speaking full sentences ABD/GI: Normal  bowel sounds; non-distended; soft, non-tender, no rebound, no guarding, no peritoneal signs, no hepatosplenomegaly BACK: The back appears normal EXT: Normal ROM in all joints; no deformity noted, no edema; no cyanosis, no calf tenderness or calf swelling, fistula in the left upper extremity with normal thrill and bruit without redness, warmth, tenderness, bleeding or other drainage SKIN: Normal color for age and race; warm; no rash on exposed skin, no urticaria, multiple areas of excoriation with some areas bleeding on his extremities from scratching but no signs of superimposed infection NEURO: Moves all extremities equally PSYCH: The patient's mood and manner are appropriate.  ____________________________________________   LABS (all labs ordered are listed, but only abnormal results are displayed)  Labs Reviewed  CBC WITH DIFFERENTIAL/PLATELET - Abnormal; Notable for the following components:      Result Value   RBC 3.13 (*)    Hemoglobin 9.4 (*)    HCT 29.5 (*)    Platelets 131 (*)    All other components within normal limits  BASIC METABOLIC PANEL - Abnormal; Notable for the following components:   Chloride 97 (*)    BUN 73 (*)    Creatinine, Ser 13.61 (*)    GFR, Estimated 4 (*)    Anion gap 19 (*)    All other components within normal limits  TROPONIN I (HIGH SENSITIVITY) - Abnormal; Notable for the following components:   Troponin I (High Sensitivity) 81 (*)    All other components within normal limits  TROPONIN I (HIGH SENSITIVITY) - Abnormal; Notable for the following components:   Troponin I (High Sensitivity) 81 (*)    All other components within normal limits   ____________________________________________  EKG   EKG Interpretation  Date/Time:  Friday Feb 01 2021 04:36:39 EDT Ventricular Rate:  108 PR Interval:  126 QRS Duration: 93 QT Interval:  386 QTC Calculation: 518 R Axis:   119 Text Interpretation: Sinus tachycardia Right axis deviation Borderline  repolarization abnormality Prolonged QT interval No significant change since last tracing Confirmed by Pryor Curia [phone]) on 02/01/2021 4:57:20 AM       ____________________________________________  RADIOLOGY Jessie Foot Benno Brensinger, personally viewed and evaluated these images (plain radiographs) as part of my medical decision making, as well as reviewing the written report by the radiologist.  ED MD interpretation: Chest x-ray clear.  Official radiology report(s): DG Chest 2 View  Result Date: 02/01/2021 CLINICAL DATA:  Right-sided chest pain EXAM: CHEST - 2 VIEW COMPARISON:  01/13/2021 FINDINGS: Normal heart  size and mediastinal contours. Atheromatous calcification. Interstitial prominence which is similar to prior. Emphysema, airway thickening, and septal thickening has been noted previously. Sclerotic bones in the setting of end-stage renal disease. IMPRESSION: Stable compared to priors.  No acute finding. Electronically Signed   By: Monte Fantasia M.D.   On: 02/01/2021 05:26    ____________________________________________   PROCEDURES  Procedure(s) performed (including Critical Care):  Procedures   ____________________________________________   INITIAL IMPRESSION / ASSESSMENT AND PLAN / ED COURSE  As part of my medical decision making, I reviewed the following data within the [place] notes reviewed and incorporated, Labs reviewed , EKG interpreted , Old EKG reviewed, Old chart reviewed, Radiograph reviewed  and Notes from prior ED visits         Patient here with atypical chest pain.  Seems to be musculoskeletal in nature.  Doubt ACS, PE, dissection.  EKG shows no new ischemic change.  Cardiac labs, chest x-ray pending.  Will give pain medication.  Patient also complaining of diffuse pruritus.  No new exposures.  No rash on exam.  Gave himself Atarax at home without relief.  Will give Benadryl here.  I suspect this is uremic pleuritis from his  end-stage renal disease.  ED PROGRESS  Patient's labs reassuring.  Hemoglobin stable compared to previous.  Potassium 4.3.  Troponin mildly elevated at 81 but this appears chronic for patient.  Will obtain second to ensure stability.  Chest x-ray clear without volume overload, cardiomegaly or widened mediastinum.   7:35 AM  Pt's second troponin is flat at 81.  He had an oxygen level documented of 87%.  Unsure if this is accurate but on recheck sats are 97%.  Tachycardia has improved.  He has mildly hypertensive.  At this time does not meet criteria for emergent or urgent dialysis.  I have encouraged him to go to his dialysis appointment today.  We discussed that uremia is likely contributing to his pruritus and would likely improve with dialysis today.  As for his chest pain, this seems to be musculoskeletal in nature.  I feel he is safe for discharge.  7:55 AM  Discussed uremic pruritus with nephrologist on-call Dr. Juleen China.  He recommends narcotics and one time dose of gabapentin.  Will give gabapentin here and dose of Nubain given no significant relief with antihistamines or morphine.  Nephrology will also contact his dialysis clinic to see if there is availability for IV medication for pruritus that can give during his dialysis treatment.  Anticipate once patient is improves clinically that he will be able to be discharged to his dialysis today.  I reviewed all nursing notes and pertinent previous records as available.  I have reviewed and interpreted any EKGs, lab and urine results, imaging (as available).  ____________________________________________   FINAL CLINICAL IMPRESSION(S) / ED DIAGNOSES  Final diagnoses:  Right-sided chest pain  Uremic pruritus     ED Discharge Orders    None      *Please note:  Jordan [name] was evaluated in Emergency Department on 02/01/2021 for the symptoms described in the history of present illness. He was evaluated in the context of the global  COVID-19 pandemic, which necessitated consideration that the patient might be at risk for infection with the SARS-CoV-2 virus that causes COVID-19. Institutional protocols and algorithms that pertain to the evaluation of patients at risk for COVID-19 are in a state of rapid change based on information released by regulatory bodies including the CDC  and federal and state organizations. These policies and algorithms were followed during the patient's care in the ED.  Some ED evaluations and interventions may be delayed as a result of limited staffing during and the pandemic.*   Note:  This document was prepared using Dragon voice recognition software and may include unintentional dictation errors.   Anuja Manka, Delice Bison, DO 02/01/21 [phone]

## 2021-02-01 NOTE — ED Triage Notes (Signed)
Pt to ED via [facility], pt states he started having chest pain and itching all over prox 30 min ago. Pt states itching started after taking "anarex", a medicine he is prescribed. Pt is a dialysis pt, mwf, missed Wednesday. Pt reports pain on right side of chest, sharp and causing sob.

## 2021-02-01 NOTE — Discharge Instructions (Addendum)
Please go to your dialysis center today for dialysis.  This will help with your itching.  Please follow-up with your nephrologist for further recommendations of medications to help with itching.  Your cardiac work-up today was reassuring.

## 2021-02-01 NOTE — ED Notes (Signed)
Pt given a [place] sandwich tray upon request, pt asking where he will get his dialysis today

## 2021-02-01 NOTE — ED Notes (Signed)
Pt attempted to sign e-signature pad unsuccessfully

## 2021-02-01 NOTE — ED Notes (Signed)
Cab called for pt to have a ride to dialysis, [facility] on [address]

## 2021-02-01 NOTE — ED Notes (Signed)
Pt given scrub pants to wear due to him stating his pants make him itch, 2x2 gauze a nd large bandaids applied to his left buttock and right posterior thigh

## 2021-02-04 DIAGNOSIS — N2581 Secondary hyperparathyroidism of renal origin: Secondary | ICD-10-CM | POA: Diagnosis not present

## 2021-02-04 DIAGNOSIS — Z992 Dependence on renal dialysis: Secondary | ICD-10-CM | POA: Diagnosis not present

## 2021-02-04 DIAGNOSIS — N186 End stage renal disease: Secondary | ICD-10-CM | POA: Diagnosis not present

## 2021-02-06 DIAGNOSIS — Z992 Dependence on renal dialysis: Secondary | ICD-10-CM | POA: Diagnosis not present

## 2021-02-06 DIAGNOSIS — N186 End stage renal disease: Secondary | ICD-10-CM | POA: Diagnosis not present

## 2021-02-06 DIAGNOSIS — N2581 Secondary hyperparathyroidism of renal origin: Secondary | ICD-10-CM | POA: Diagnosis not present

## 2021-02-11 ENCOUNTER — Emergency Department
Admission: EM | Admit: 2021-02-11 | Discharge: 2021-02-11 | Disposition: A | Discharge status: Home / Self Care | Payer: Medicare Other | Attending: Emergency Medicine | Admitting: Emergency Medicine

## 2021-02-11 ENCOUNTER — Other Ambulatory Visit: Payer: Self-pay

## 2021-02-11 DIAGNOSIS — N2581 Secondary hyperparathyroidism of renal origin: Secondary | ICD-10-CM | POA: Diagnosis not present

## 2021-02-11 DIAGNOSIS — Z79899 Other long term (current) drug therapy: Secondary | ICD-10-CM | POA: Insufficient documentation

## 2021-02-11 DIAGNOSIS — R6889 Other general symptoms and signs: Secondary | ICD-10-CM | POA: Diagnosis not present

## 2021-02-11 DIAGNOSIS — I5042 Chronic combined systolic (congestive) and diastolic (congestive) heart failure: Secondary | ICD-10-CM | POA: Insufficient documentation

## 2021-02-11 DIAGNOSIS — N186 End stage renal disease: Secondary | ICD-10-CM | POA: Diagnosis not present

## 2021-02-11 DIAGNOSIS — R0902 Hypoxemia: Secondary | ICD-10-CM | POA: Diagnosis not present

## 2021-02-11 DIAGNOSIS — I132 Hypertensive heart and chronic kidney disease with heart failure and with stage 5 chronic kidney disease, or end stage renal disease: Secondary | ICD-10-CM | POA: Diagnosis not present

## 2021-02-11 DIAGNOSIS — D631 Anemia in chronic kidney disease: Secondary | ICD-10-CM | POA: Diagnosis not present

## 2021-02-11 DIAGNOSIS — Z743 Need for continuous supervision: Secondary | ICD-10-CM | POA: Diagnosis not present

## 2021-02-11 DIAGNOSIS — L298 Other pruritus: Secondary | ICD-10-CM

## 2021-02-11 DIAGNOSIS — Z992 Dependence on renal dialysis: Secondary | ICD-10-CM | POA: Insufficient documentation

## 2021-02-11 DIAGNOSIS — L299 Pruritus, unspecified: Secondary | ICD-10-CM | POA: Diagnosis not present

## 2021-02-11 DIAGNOSIS — F17210 Nicotine dependence, cigarettes, uncomplicated: Secondary | ICD-10-CM | POA: Diagnosis not present

## 2021-02-11 MED ORDER — GABAPENTIN 100 MG PO CAPS
100.0000 mg | ORAL_CAPSULE | Freq: Once | ORAL | Status: AC
  Administered 2021-02-11: 100 mg via ORAL
  Filled 2021-02-11: qty 1

## 2021-02-11 MED ORDER — PRAMOXINE HCL 1 % EX LOTN: 1.0000 "application " | TOPICAL_LOTION | Freq: Two times a day (BID) | CUTANEOUS | 1 refills | Status: DC

## 2021-02-11 MED ORDER — DIPHENHYDRAMINE HCL 50 MG/ML IJ SOLN
50.0000 mg | Freq: Once | INTRAMUSCULAR | Status: AC
  Administered 2021-02-11: 50 mg via INTRAMUSCULAR
  Filled 2021-02-11: qty 1

## 2021-02-11 NOTE — ED Notes (Signed)
Pt repeatedly asking for "any type of cream" for his itching, states "I can't leave like this!". Multiple attempts at educating pt that he has been prescribed a cream for his itching and that the cream is not available here in the ER. Pt states "I'm going to have to come back to the ER for this itching!". More education provided by Dr. Alfred Levins and this RN that appropriate medications already prescribed. Pt remains upset with plan for dispo, states again that he needs "some type of cream." More education provided that he is being discharged with a prescription for a cream.

## 2021-02-11 NOTE — ED Notes (Signed)
Called and spoke with pt's brother Jordan [name] who states he is unable to come and pick pt up but he will work on finding someone to come and drive pt home. States he will call back with an update

## 2021-02-11 NOTE — ED Provider Notes (Signed)
[facility] Emergency Department Provider Note  ____________________________________________  Time seen: Approximately 3:38 AM  I have reviewed the triage vital signs and the nursing notes.   HISTORY  Chief Complaint Pruritis   HPI Jordan [name] is a 55 y.o. male history of ESRD on HD and uremic pruritus who presents for evaluation of pruritus.  Patient reports that he has tried Benadryl and lotions at home unsuccessfully.  Reports that he is pruritus has been worse this week.  He reports that he has been picking at his scalp because the itching is so bad.  No fever or chills, no rash.   Past Medical History:  Diagnosis Date  . Anemia   . Aortic atherosclerosis ([place]) 11/12/2019  . CKD (chronic kidney disease)    Stage 5  Dialysis - M/W/F in [place], [place]  . Dyspnea    tx with inhaler when sick  . ED (erectile dysfunction)   . Emphysema of lung ([place]) 11/12/2019  . ETOH abuse   . History of blood transfusion   . Hypertension   . Wears dentures     Patient Active Problem List   Diagnosis Date Noted  . Multifocal pneumonia 01/13/2021  . Current mild episode of major depressive disorder without prior episode ([place]) 04/09/2020  . Atrial fibrillation with RVR ([place]) 04/04/2020  . 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 Chronic combined systolic (congestive) and diastolic (congestive) heart failure ([place]) 02/03/2020  . Aortic atherosclerosis ([place]) 11/12/2019  . Emphysema of lung ([place]) 11/12/2019  . Anemia in ESRD (end-stage renal disease) ([place]) 05/17/2018  . ESRD on dialysis ([place]) 05/26/2017  . Essential hypertension 05/26/2017  . Moderate protein-calorie malnutrition ([employer]) 08/13/2016  . Secondary hyperparathyroidism of renal origin ([place]) 08/13/2016    Past Surgical History:  Procedure Laterality Date  . BASCILIC VEIN TRANSPOSITION Left 08/07/2016   Procedure: LEFT BASILIC VEIN TRANSPOSITION;  Surgeon: Angelia Mould, MD;  Location: [place];  Service: Vascular;  Laterality: Left;  . CLOSED REDUCTION NASAL FRACTURE N/A 08/11/2019   Procedure: CLOSED REDUCTION NASAL FRACTURE WITH STABILIZATION;  Surgeon: Irene Limbo, MD;  Location: [place];  Service: Plastics;  Laterality: N/A;  . COLONOSCOPY N/A 08/12/2016   Procedure: COLONOSCOPY;  Surgeon: Otis Brace, MD;  Location: [place];  Service: Gastroenterology;  Laterality: N/A;  . ESOPHAGOGASTRODUODENOSCOPY N/A 08/12/2016   Procedure: ESOPHAGOGASTRODUODENOSCOPY (EGD);  Surgeon: Otis Brace, MD;  Location: [place];  Service: Gastroenterology;  Laterality: N/A;  . INSERTION OF DIALYSIS CATHETER N/A 08/07/2016   Procedure: INSERTION OF TUNNELED DIALYSIS CATHETER;  Surgeon: Angelia Mould, MD;  Location: [place];  Service: Vascular;  Laterality: N/A;  . LIGATION OF ARTERIOVENOUS  FISTULA Left 09/12/2016   Procedure: BANDING OF LEFT  ARTERIOVENOUS  FISTULA;  Surgeon: Angelia Mould, MD;  Location: [place];  Service: Vascular;  Laterality: Left;  . LOWER EXTREMITY INTERVENTION Right 12/02/2018   Procedure: LOWER EXTREMITY INTERVENTION;  Surgeon: Algernon Huxley, MD;  Location: [place] [facility];  Service: Cardiovascular;  Laterality: Right;    Prior to Admission medications   Medication Sig Start Date End Date Taking? Authorizing Provider  pramoxine (SARNA SENSITIVE) 1 % LOTN Apply 1 application topically 2 (two) times daily. 02/11/21  Yes Alfred Levins, [place], MD  AURYXIA 1 GM 210 MG(Fe) tablet Take 420 mg by mouth every Monday, Wednesday, and Friday with hemodialysis. 11/13/20   [provider]  calcium acetate (PHOSLO) 667 MG capsule Take 2 capsules (1,334  mg total) by mouth 3 (three) times daily with meals. 02/12/19   Fritzi Mandes, MD  cinacalcet (SENSIPAR) 90 MG tablet Take 1 tablet by mouth every evening. 11/28/20   [provider]  epoetin alfa (EPOGEN) 4000 UNIT/ML injection Inject 1 mL (4,000 Units total) into  the vein every Monday, Wednesday, and Friday with hemodialysis. 06/22/20   Enzo Bi, MD  hydrALAZINE (APRESOLINE) 50 MG tablet Take 1 tablet (50 mg total) by mouth 3 (three) times daily as needed. 01/15/21   Lorella Nimrod, MD  hydrOXYzine (ATARAX/VISTARIL) 10 MG tablet Take 10 mg by mouth 3 (three) times daily as needed. 01/10/21   [provider]  patiromer Daryll Drown) 8.4 g packet Take by mouth. 03/13/20   [provider]  SARNA lotion Apply topically. 08/01/20   [provider]  sevelamer carbonate (RENVELA) 800 MG tablet Take 800 mg by mouth 3 (three) times daily with meals.    [provider]  thiamine 100 MG tablet Take 1 tablet (100 mg total) by mouth daily. Patient taking differently: Take 100 mg by mouth every Monday, Wednesday, and Friday with hemodialysis. 06/20/20   Enzo Bi, MD  triamcinolone cream (KENALOG) 0.1 % SMARTSIG:1 Application Topical 2-3 Times Daily 12/28/20   [provider]    Allergies Lisinopril  Family History  Problem Relation Age of Onset  . Diabetes Mother   . Kidney failure Mother   . Healthy Father   . Kidney failure Brother   . Healthy Sister   . Kidney disease Daughter   . Post-traumatic stress disorder Neg Hx   . Bladder Cancer Neg Hx   . Kidney cancer Neg Hx     Social History Social History   Tobacco Use  . Smoking status: Current Every Day Smoker    Packs/day: 0.50    Years: 40.00    Pack years: 20.00    Types: Cigarettes  . Smokeless tobacco: Never Used  Vaping Use  . Vaping Use: Never used  Substance Use Topics  . Alcohol use: Yes    Alcohol/week: 21.0 standard drinks    Types: 21 Cans of beer per week    Comment: last drink 6 months ago  . Drug use: No    Review of Systems  Constitutional: Negative for fever. Eyes: Negative for visual changes. ENT: Negative for sore throat. Neck: No neck pain  Cardiovascular: Negative for chest pain. Respiratory: Negative for shortness of  breath. Gastrointestinal: Negative for abdominal pain, vomiting or diarrhea. Genitourinary: Negative for dysuria. Musculoskeletal: Negative for back pain. Skin: Negative for rash. + pruritus Neurological: Negative for headaches, weakness or numbness. Psych: No SI or HI  ____________________________________________   PHYSICAL EXAM:  VITAL SIGNS: ED Triage Vitals  Enc Vitals Group     BP 02/11/21 0302 (!) 163/85     Pulse Rate 02/11/21 0302 (!) 102     Resp 02/11/21 0302 20     Temp 02/11/21 0302 98.1 F (36.7 C)     Temp src --      SpO2 02/11/21 0302 99 %     Weight 02/11/21 0303 160 lb (72.6 kg)     Height 02/11/21 0303 '5\' 4"'$  (1.626 m)     Head Circumference --      Peak Flow --      Pain Score --      Pain Loc --      Pain Edu? --      Excl. in [place]? --     Constitutional:  Alert and oriented. Well appearing and in no apparent distress. HEENT:      Head: Normocephalic and atraumatic.         Eyes: Conjunctivae are normal. Sclera is non-icteric.       Mouth/Throat: Mucous membranes are moist.       Neck: Supple with no signs of meningismus. Cardiovascular: Regular rate and rhythm. No murmurs, gallops, or rubs.  Respiratory: Normal respiratory effort. Lungs are clear to auscultation bilaterally.  Gastrointestinal: Soft, non tender. Musculoskeletal:  No edema, cyanosis, or erythema of extremities. Neurologic: Normal speech and language. Face is symmetric. Moving all extremities. No gross focal neurologic deficits are appreciated. Skin: Skin is warm, dry and intact. No rash noted. Psychiatric: Mood and affect are normal. Speech and behavior are normal.  ____________________________________________   LABS (all labs ordered are listed, but only abnormal results are displayed)  Labs Reviewed - No data to display ____________________________________________  EKG  none  ____________________________________________  RADIOLOGY  none   ____________________________________________   PROCEDURES  Procedure(s) performed: None Procedures Critical Care performed:  None ____________________________________________   INITIAL IMPRESSION / ASSESSMENT AND PLAN / ED COURSE  55 y.o. male history of ESRD on HD and uremic pruritus who presents for evaluation of pruritus.  This is a chronic problem that has been going on for years.  Patient reports that Benadryl does not work but is willing to try a dose of IM Benadryl.  I did explain to the patient that this is a very hard condition and there are very limited treatments.  His nephrologist recommended trying gabapentin at nighttime to help him sleep.  Is unclear if patient is taking it he is not sure.  He has not tried anything before coming to the emergency room.  I did explain to the patient that treatment is very limited but I will offer to give him a dose of gabapentin and an IM shot of Benadryl.  I will discharge him home on pramoxine cream and I recommended him to follow-up with his nephrologist to try to adjust the dialysis to prevent his symptoms from getting too bad.       _____________________________________________ Please note:  Patient was evaluated in Emergency Department today for the symptoms described in the history of present illness. Patient was evaluated in the context of the global COVID-19 pandemic, which necessitated consideration that the patient might be at risk for infection with the SARS-CoV-2 virus that causes COVID-19. Institutional protocols and algorithms that pertain to the evaluation of patients at risk for COVID-19 are in a state of rapid change based on information released by regulatory bodies including the CDC and federal and state organizations. These policies and algorithms were followed during the patient's care in the ED.  Some ED evaluations and interventions may be delayed as a result of limited staffing during the pandemic.   [place] Controlled  Substance Database was reviewed by me. ____________________________________________   FINAL CLINICAL IMPRESSION(S) / ED DIAGNOSES   Final diagnoses:  Uremic pruritus      NEW MEDICATIONS STARTED DURING THIS VISIT:  ED Discharge Orders         Ordered    pramoxine (SARNA SENSITIVE) 1 % LOTN  2 times daily        02/11/21 [id number]           Note:  This document was prepared using Dragon voice recognition software and may include unintentional dictation errors.    Alfred Levins, [place], MD 02/11/21 [phone]

## 2021-02-11 NOTE — ED Notes (Signed)
Pt's family member Carlyon Shadow states she will pick up pt around 0545. Pt wheeled to lobby by ED charge RN, pt aware of ride coming.

## 2021-02-11 NOTE — ED Triage Notes (Signed)
Pt states has been itching for years. Pt is on MWF dialysis and has been taking his phosphate binders. Pt has states is due to have his phosphorus level checked this week.

## 2021-02-13 DIAGNOSIS — N2581 Secondary hyperparathyroidism of renal origin: Secondary | ICD-10-CM | POA: Diagnosis not present

## 2021-02-13 DIAGNOSIS — N186 End stage renal disease: Secondary | ICD-10-CM | POA: Diagnosis not present

## 2021-02-13 DIAGNOSIS — D631 Anemia in chronic kidney disease: Secondary | ICD-10-CM | POA: Diagnosis not present

## 2021-02-13 DIAGNOSIS — Z992 Dependence on renal dialysis: Secondary | ICD-10-CM | POA: Diagnosis not present

## 2021-02-15 DIAGNOSIS — Z992 Dependence on renal dialysis: Secondary | ICD-10-CM | POA: Diagnosis not present

## 2021-02-15 DIAGNOSIS — N2581 Secondary hyperparathyroidism of renal origin: Secondary | ICD-10-CM | POA: Diagnosis not present

## 2021-02-15 DIAGNOSIS — N186 End stage renal disease: Secondary | ICD-10-CM | POA: Diagnosis not present

## 2021-02-15 DIAGNOSIS — D631 Anemia in chronic kidney disease: Secondary | ICD-10-CM | POA: Diagnosis not present

## 2021-02-19 DIAGNOSIS — N186 End stage renal disease: Secondary | ICD-10-CM | POA: Diagnosis not present

## 2021-02-19 DIAGNOSIS — Z992 Dependence on renal dialysis: Secondary | ICD-10-CM | POA: Diagnosis not present

## 2021-02-19 DIAGNOSIS — I129 Hypertensive chronic kidney disease with stage 1 through stage 4 chronic kidney disease, or unspecified chronic kidney disease: Secondary | ICD-10-CM | POA: Diagnosis not present

## 2021-02-26 ENCOUNTER — Emergency Department: Payer: Medicare Other

## 2021-02-26 ENCOUNTER — Encounter: Payer: Self-pay | Admitting: Emergency Medicine

## 2021-02-26 ENCOUNTER — Other Ambulatory Visit: Payer: Self-pay

## 2021-02-26 ENCOUNTER — Emergency Department
Admission: EM | Admit: 2021-02-26 | Discharge: 2021-02-26 | Disposition: A | Discharge status: Home / Self Care | Payer: Medicare Other | Attending: Emergency Medicine | Admitting: Emergency Medicine

## 2021-02-26 DIAGNOSIS — I3139 Other pericardial effusion (noninflammatory): Secondary | ICD-10-CM

## 2021-02-26 DIAGNOSIS — F17210 Nicotine dependence, cigarettes, uncomplicated: Secondary | ICD-10-CM | POA: Insufficient documentation

## 2021-02-26 DIAGNOSIS — I5042 Chronic combined systolic (congestive) and diastolic (congestive) heart failure: Secondary | ICD-10-CM | POA: Diagnosis not present

## 2021-02-26 DIAGNOSIS — L298 Other pruritus: Secondary | ICD-10-CM | POA: Diagnosis not present

## 2021-02-26 DIAGNOSIS — Z992 Dependence on renal dialysis: Secondary | ICD-10-CM | POA: Insufficient documentation

## 2021-02-26 DIAGNOSIS — J90 Pleural effusion, not elsewhere classified: Secondary | ICD-10-CM | POA: Insufficient documentation

## 2021-02-26 DIAGNOSIS — J439 Emphysema, unspecified: Secondary | ICD-10-CM | POA: Diagnosis not present

## 2021-02-26 DIAGNOSIS — R0602 Shortness of breath: Secondary | ICD-10-CM | POA: Diagnosis present

## 2021-02-26 DIAGNOSIS — I313 Pericardial effusion (noninflammatory): Secondary | ICD-10-CM

## 2021-02-26 DIAGNOSIS — I132 Hypertensive heart and chronic kidney disease with heart failure and with stage 5 chronic kidney disease, or end stage renal disease: Secondary | ICD-10-CM | POA: Insufficient documentation

## 2021-02-26 DIAGNOSIS — N186 End stage renal disease: Secondary | ICD-10-CM

## 2021-02-26 LAB — CBC WITH DIFFERENTIAL/PLATELET
Abs Immature Granulocytes: 0.07 10*3/uL (ref 0.00–0.07)
Basophils Absolute: 0 10*3/uL (ref 0.0–0.1)
Basophils Relative: 0 %
Eosinophils Absolute: 0.3 10*3/uL (ref 0.0–0.5)
Eosinophils Relative: 3 %
HCT: 26.1 % — ABNORMAL LOW (ref 39.0–52.0)
Hemoglobin: 8.6 g/dL — ABNORMAL LOW (ref 13.0–17.0)
Immature Granulocytes: 1 %
Lymphocytes Relative: 8 %
Lymphs Abs: 0.7 10*3/uL (ref 0.7–4.0)
MCH: 31.9 pg (ref 26.0–34.0)
MCHC: 33 g/dL (ref 30.0–36.0)
MCV: 96.7 fL (ref 80.0–100.0)
Monocytes Absolute: 0.8 10*3/uL (ref 0.1–1.0)
Monocytes Relative: 9 %
Neutro Abs: 7.4 10*3/uL (ref 1.7–7.7)
Neutrophils Relative %: 79 %
Platelets: 129 10*3/uL — ABNORMAL LOW (ref 150–400)
RBC: 2.7 MIL/uL — ABNORMAL LOW (ref 4.22–5.81)
RDW: 19.5 % — ABNORMAL HIGH (ref 11.5–15.5)
WBC: 9.3 10*3/uL (ref 4.0–10.5)
nRBC: 0.2 % (ref 0.0–0.2)

## 2021-02-26 LAB — COMPREHENSIVE METABOLIC PANEL
ALT: 14 U/L (ref 0–44)
AST: 23 U/L (ref 15–41)
Albumin: 3.8 g/dL (ref 3.5–5.0)
Alkaline Phosphatase: 115 U/L (ref 38–126)
Anion gap: 18 — ABNORMAL HIGH (ref 5–15)
BUN: 88 mg/dL — ABNORMAL HIGH (ref 6–20)
CO2: 20 mmol/L — ABNORMAL LOW (ref 22–32)
Calcium: 9.5 mg/dL (ref 8.9–10.3)
Chloride: 99 mmol/L (ref 98–111)
Creatinine, Ser: 16.45 mg/dL — ABNORMAL HIGH (ref 0.61–1.24)
GFR, Estimated: 3 mL/min — ABNORMAL LOW (ref >60)
Glucose, Bld: 104 mg/dL — ABNORMAL HIGH (ref 70–99)
Potassium: 4.7 mmol/L (ref 3.5–5.1)
Sodium: 137 mmol/L (ref 135–145)
Total Bilirubin: 1.9 mg/dL — ABNORMAL HIGH (ref 0.3–1.2)
Total Protein: 7.7 g/dL (ref 6.5–8.1)

## 2021-02-26 LAB — TROPONIN I (HIGH SENSITIVITY)
Troponin I (High Sensitivity): 91 ng/L — ABNORMAL HIGH (ref <18)
Troponin I (High Sensitivity): 94 ng/L — ABNORMAL HIGH (ref <18)

## 2021-02-26 LAB — D-DIMER, QUANTITATIVE: D-Dimer, Quant: 5.73 ug/mL-FEU — ABNORMAL HIGH (ref 0.00–0.50)

## 2021-02-26 LAB — BRAIN NATRIURETIC PEPTIDE: B Natriuretic Peptide: 2730.3 pg/mL — ABNORMAL HIGH (ref 0.0–100.0)

## 2021-02-26 IMAGING — CR DG CHEST 2V
1 series · 2 of 2 positions shown · non-contrast
Comparison: [DATE]

CLINICAL DATA: Rash and shortness of breath.

EXAM:
CHEST - 2 VIEW

[Series 1: dg chest 2 view · 0.14mm/px · 2 of 2 slices shown]
[im 1/2]
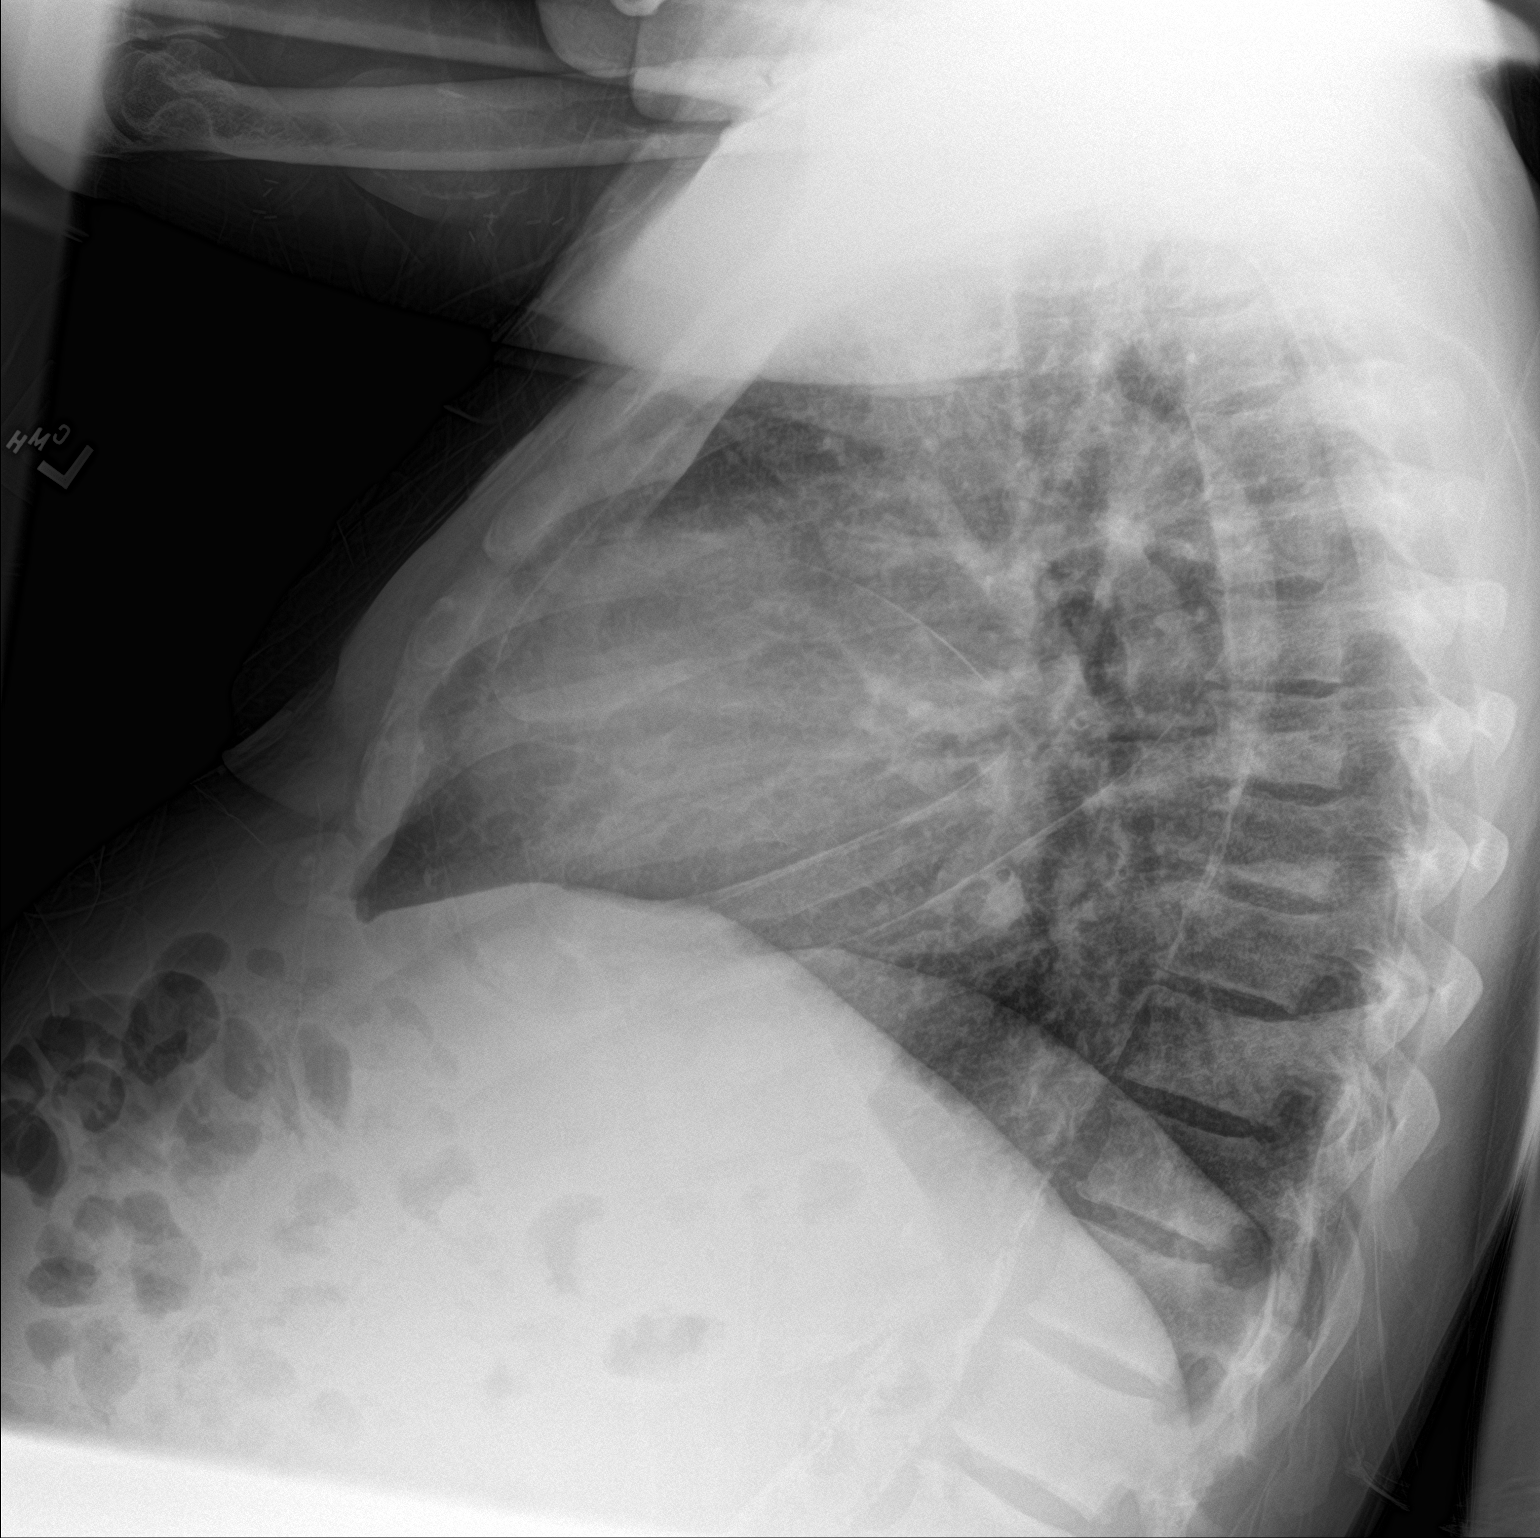
[im 2/2]
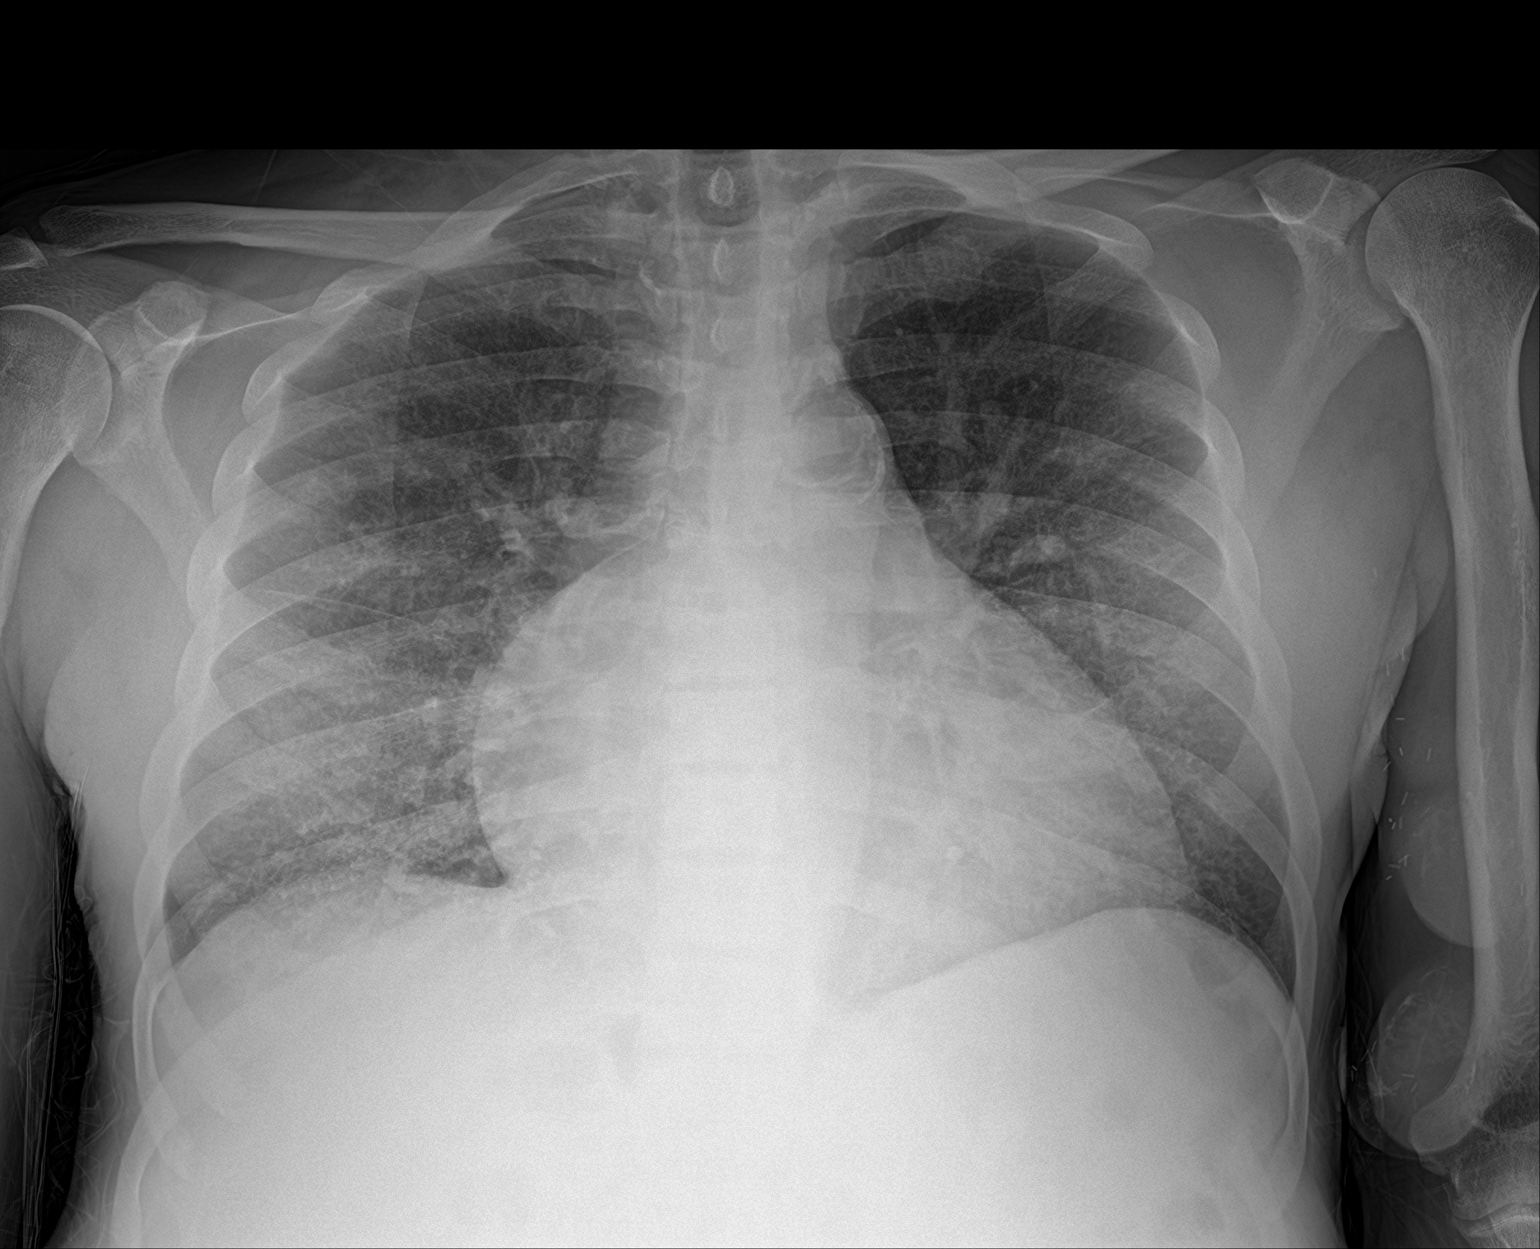

[2 of 2 positions shown; findings below may reference images not displayed]

FINDINGS: Mild, predominantly stable diffusely increased interstitial lung
markings are seen. There is no evidence of a pleural effusion or
pneumothorax. The cardiac silhouette is enlarged. Moderate severity
calcification of the aortic arch is noted. The visualized skeletal
structures are unremarkable.
IMPRESSION: Stable exam without active cardiopulmonary disease.

## 2021-02-26 IMAGING — CT CT ANGIO CHEST
2 of 6 series · 18 of 46 positions shown · IV contrast (APPLIED)
Comparison: Chest radiographs obtained earlier today. Chest CTA
dated [DATE]

CLINICAL DATA: Shortness of breath and leg swelling. Dialysis
patient.

EXAM:
CT ANGIOGRAPHY CHEST WITH CONTRAST
TECHNIQUE: Multidetector CT imaging of the chest was performed using the
standard protocol during bolus administration of intravenous
contrast. Multiplanar CT image reconstructions and MIPs were
obtained to evaluate the vascular anatomy.
CONTRAST:  75mL OMNIPAQUE IOHEXOL 350 MG/ML SOLN

[Series 5: thins · axial · 0.74mm/px · z∈[-619,-355]mm · 15 of 360 slices shown]
[im 15/360  lung]
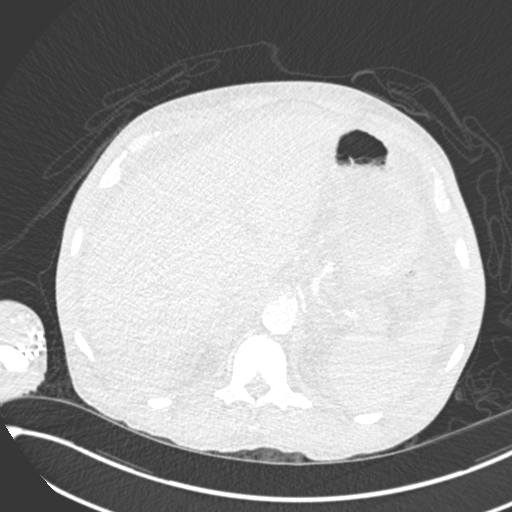
[im 45/360  soft-tissue]
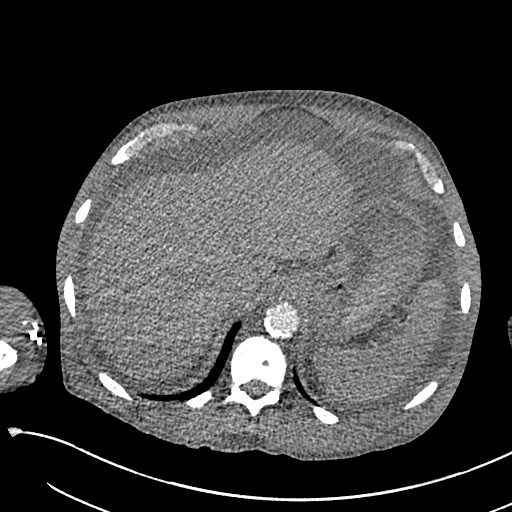
[im 60/360  lung]
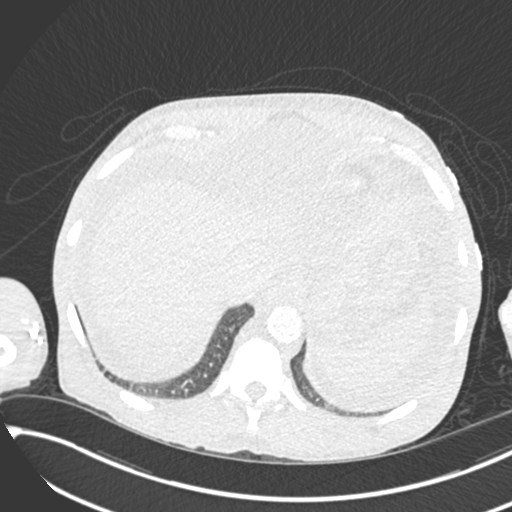
[im 90/360  soft-tissue]
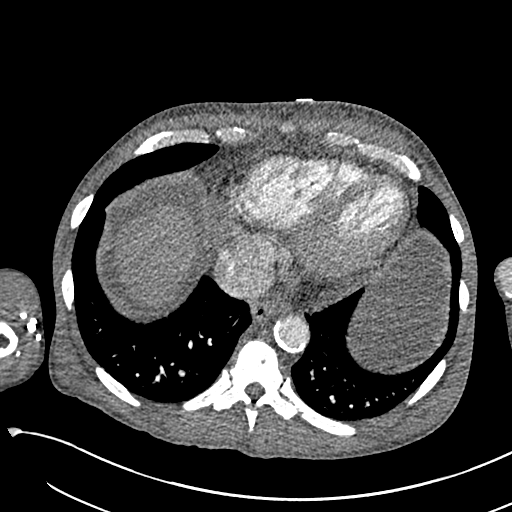
[im 105/360  lung]
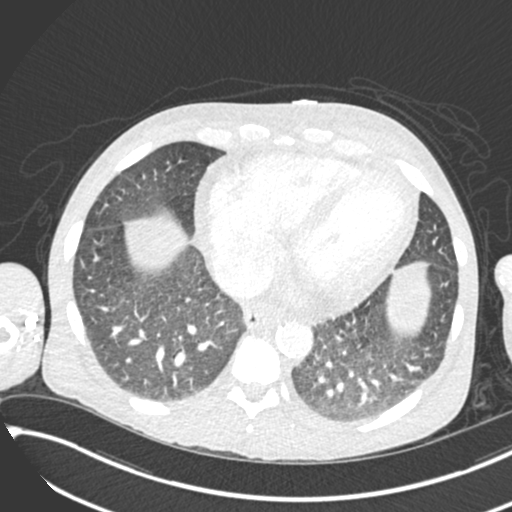
[im 135/360  soft-tissue]
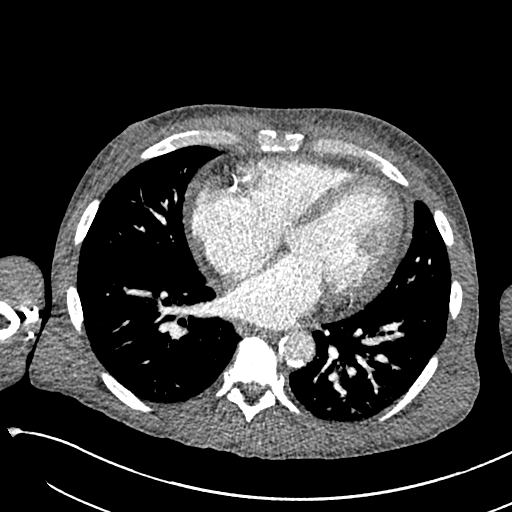
[im 150/360  lung]
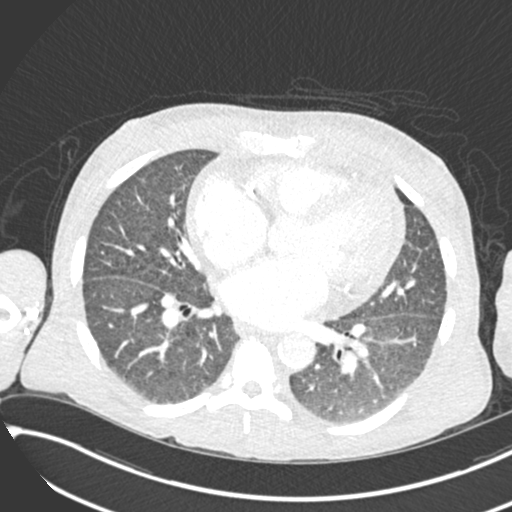
[im 180/360  soft-tissue]
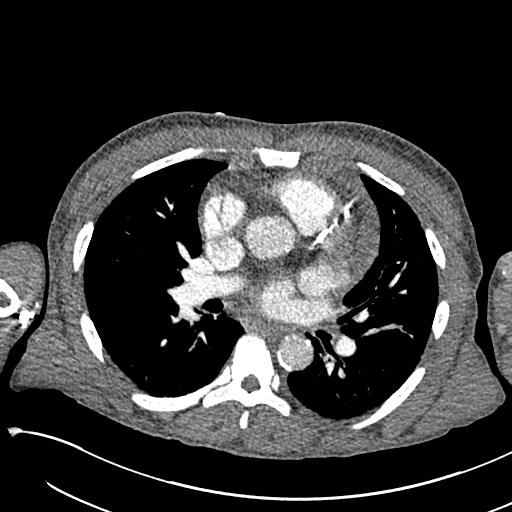
[im 210/360  lung]
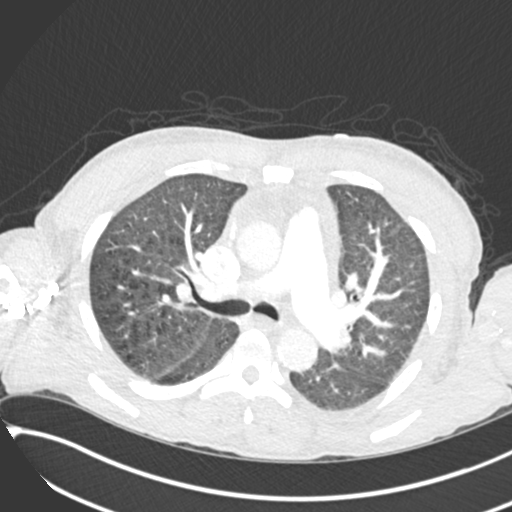
[im 225/360  soft-tissue]
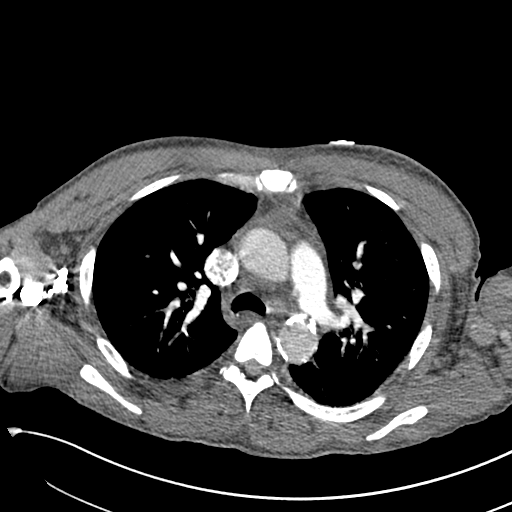
[im 255/360  lung]
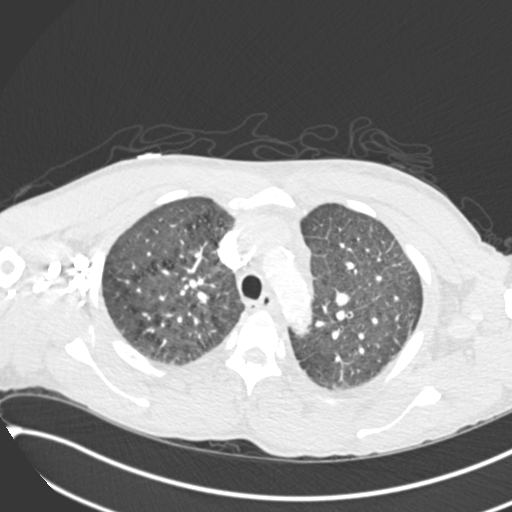
[im 270/360  soft-tissue]
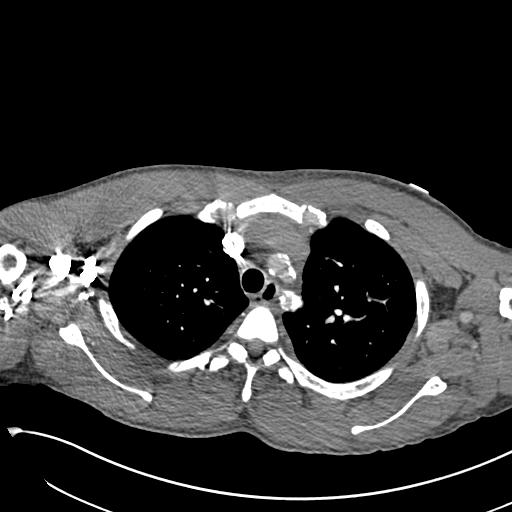
[im 300/360  lung]
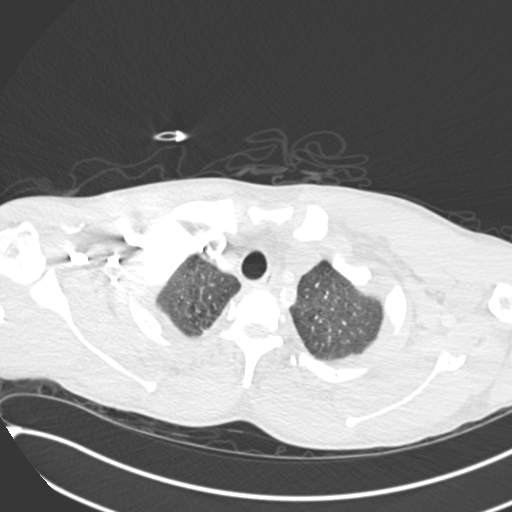
[im 315/360  soft-tissue]
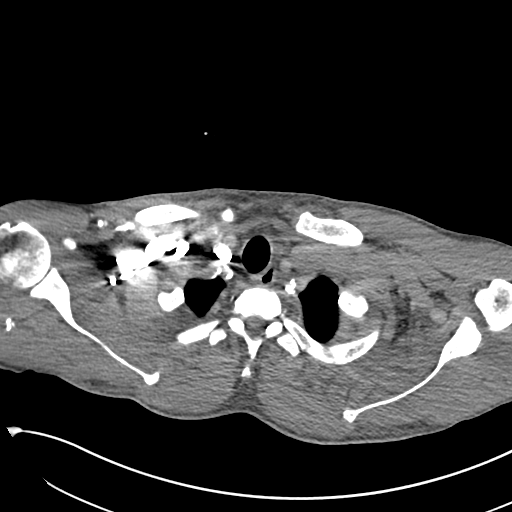
[im 345/360  lung]
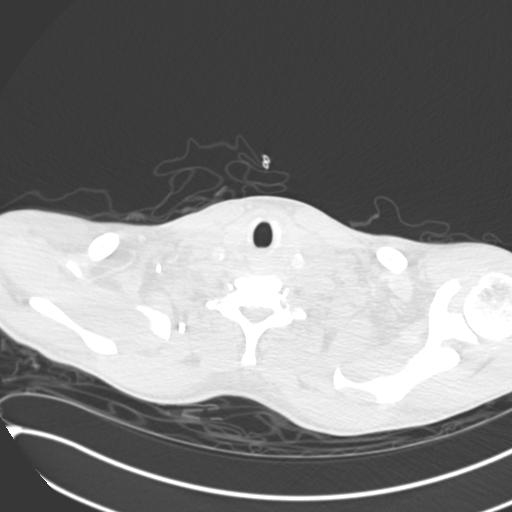

[Series 7: coronal mpr · coronal · 0.56mm/px · 3 of 88 slices shown]
[im 22/88  soft-tissue]
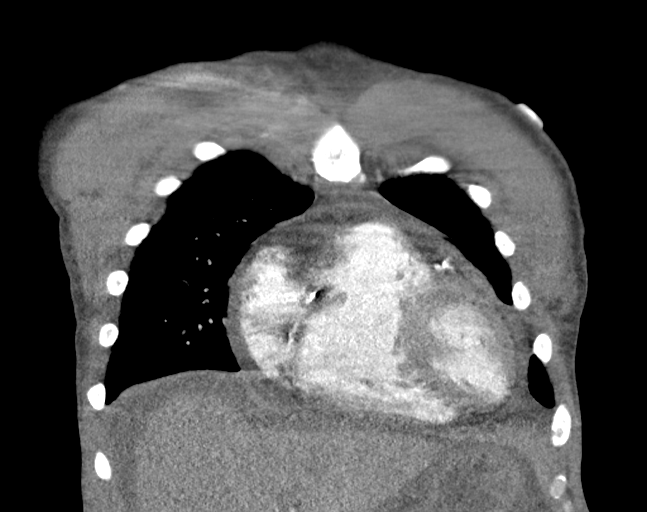
[im 44/88  soft-tissue]
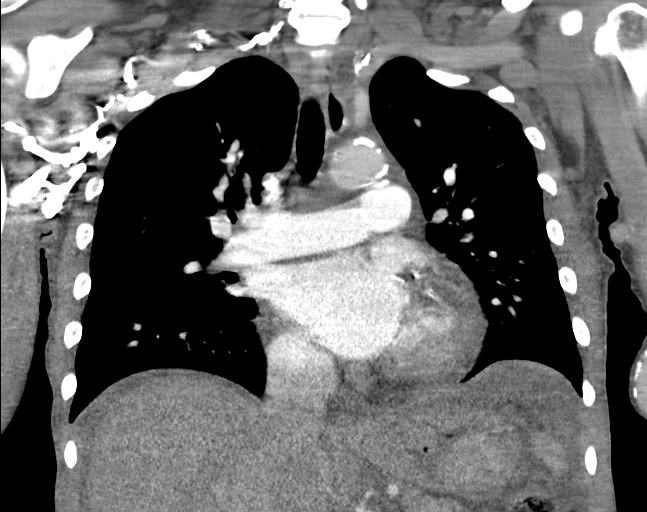
[im 66/88  soft-tissue]
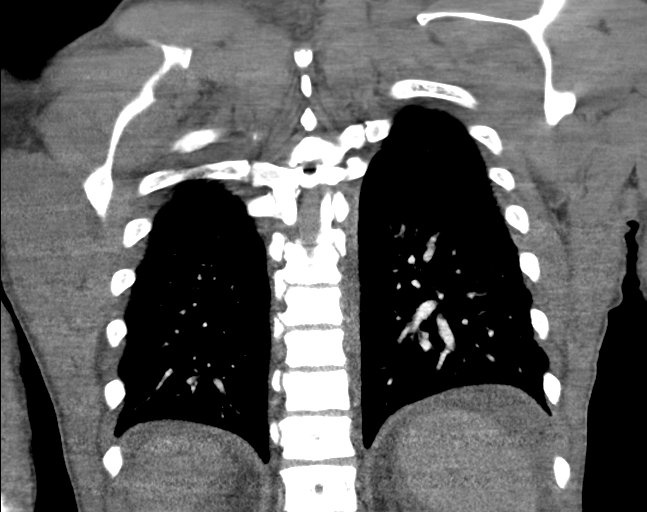

[18 of 46 positions shown; findings below may reference images not displayed]

FINDINGS: Cardiovascular: Stable enlarged heart. Interval small pericardial
effusion with a maximum thickness of 7 mm. Atheromatous
calcifications, including the coronary arteries and aorta. Normally
opacified pulmonary arteries with no pulmonary arterial filling
defects seen.

Mediastinum/Nodes: No enlarged mediastinal, hilar, or axillary lymph
nodes. Thyroid gland, trachea, and esophagus demonstrate no
significant findings.

Lungs/Pleura: Stable diffuse prominence of the interstitial markings
with mild centrilobular bullous changes, most pronounced in the
right upper lobe. No interval airspace consolidation or pleural
fluid.

Upper Abdomen: Interval small to moderate amount of free peritoneal
fluid.

Musculoskeletal: Diffuse bone sclerosis without significant change.
Thoracic and lower cervical spine degenerative changes.

Review of the MIP images confirms the above findings.
IMPRESSION: 1. No pulmonary emboli.
2. Interval small pericardial effusion.
3. Interval small to moderate amount of ascites.
4.  Calcific coronary artery and aortic atherosclerosis.
5. Stable cardiomegaly and changes of COPD with chronic interstitial
lung disease.
6. Stable diffuse bone sclerosis compatible with renal
osteodystrophy.

Aortic Atherosclerosis ([GG]-[GG]) and Emphysema ([GG]-[GG]).

## 2021-02-26 MED ORDER — IOHEXOL 350 MG/ML SOLN
75.0000 mL | Freq: Once | INTRAVENOUS | Status: AC | PRN
  Administered 2021-02-26: 75 mL via INTRAVENOUS
  Filled 2021-02-26: qty 75

## 2021-02-26 MED ORDER — LORATADINE 10 MG PO TABS
10.0000 mg | ORAL_TABLET | Freq: Every day | ORAL | Status: DC
  Administered 2021-02-26: 10 mg via ORAL
  Filled 2021-02-26: qty 1

## 2021-02-26 MED ORDER — HYDROXYZINE PAMOATE 50 MG PO CAPS: 50.0000 mg | ORAL_CAPSULE | Freq: Three times a day (TID) | ORAL | 0 refills | Status: DC | PRN

## 2021-02-26 MED ORDER — HYDROXYZINE HCL 25 MG PO TABS
25.0000 mg | ORAL_TABLET | Freq: Once | ORAL | Status: AC
  Administered 2021-02-26: 25 mg via ORAL
  Filled 2021-02-26: qty 1

## 2021-02-26 MED ORDER — LIDOCAINE HCL URETHRAL/MUCOSAL 2 % EX GEL
1.0000 "application " | Freq: Once | CUTANEOUS | Status: AC
  Administered 2021-02-26: 1 via TOPICAL
  Filled 2021-02-26: qty 5

## 2021-02-26 MED ORDER — LIDOCAINE 3 % EX CREA: 1.0000 "application " | TOPICAL_CREAM | Freq: Two times a day (BID) | CUTANEOUS | 0 refills | Status: DC | PRN

## 2021-02-26 MED ORDER — CEPHALEXIN 500 MG PO CAPS: 500.0000 mg | ORAL_CAPSULE | Freq: Four times a day (QID) | ORAL | 0 refills | Status: DC

## 2021-02-26 NOTE — ED Notes (Addendum)
Patient c/o itching. PA-C aware.  Patient refuses to keep B/P cuff on due to itching.

## 2021-02-26 NOTE — ED Triage Notes (Addendum)
Sent to ED by [facility] for evaluation.   Patient c/o itching rash to head last night, abdominal distention, SOB and leg swelling.  Patient received dialysis M-W-F.  Last dialysis - Friday

## 2021-02-26 NOTE — ED Notes (Signed)
Pt used call bell to request something for "the itchiness". When asked where he is itching pt points at his buttocks. Provider [place] notified in person.

## 2021-02-26 NOTE — ED Notes (Signed)
Patient was given a TV remote. Patient still c/o itching on buttocks. Patient cannot tolerate clothes or the blankets on his buttocks.

## 2021-02-26 NOTE — ED Provider Notes (Signed)
[facility] Emergency Department Provider Note  ____________________________________________  Time seen: Approximately 4:03 PM  I have reviewed the triage vital signs and the nursing notes.   HISTORY  Chief Complaint Shortness of Breath    HPI Jordan [name] is a 55 y.o. male who presents the emergency department complaining of shortness of breath, chest pain, cough, sores to the skin.  Patient states that his chest pain and shortness of breath began yesterday.  He describes it as left-sided chest pain.  Does not radiate.  Patient is endorsing shortness of breath and cough with this as well.  He does have a history of emphysema and states that he will use inhalers when he feels short of breath or use oxygen.  Patient states that when he feels this way he typically uses 2 L via nasal cannula.  Patient with a complex medical history to include anemia, end-stage renal disease on dialysis, congestive heart failure, emphysema, hypertension.  Patient is a Monday Wednesday Friday dialysis patient and last dialysis was on Friday, missing yesterday's Monday dialysis.  Patient states that he feels like his legs are sore and tight as well.  He does weigh himself at dialysis but not at home.  He denies any fluctuation of his weight.  No fevers or chills, URI symptoms.  Patient states that he believes that he may be retaining some fluid around his abdomen and does also have a history of         Past Medical History:  Diagnosis Date  . Anemia   . Aortic atherosclerosis ([place]) 11/12/2019  . CKD (chronic kidney disease)    Stage 5  Dialysis - M/W/F in [place], [place]  . Dyspnea    tx with inhaler when sick  . ED (erectile dysfunction)   . Emphysema of lung ([place]) 11/12/2019  . ETOH abuse   . History of blood transfusion   . Hypertension   . Wears dentures     Patient Active Problem List   Diagnosis Date Noted  . Multifocal pneumonia 01/13/2021  . Current mild episode of  major depressive disorder without prior episode ([place]) 04/09/2020  . Atrial fibrillation with RVR ([place]) 04/04/2020  . 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 Chronic combined systolic (congestive) and diastolic (congestive) heart failure ([place]) 02/03/2020  . Aortic atherosclerosis ([place]) 11/12/2019  . Emphysema of lung ([place]) 11/12/2019  . Anemia in ESRD (end-stage renal disease) ([place]) 05/17/2018  . ESRD on dialysis ([place]) 05/26/2017  . Essential hypertension 05/26/2017  . Moderate protein-calorie malnutrition ([place]) 08/13/2016  . Secondary hyperparathyroidism of renal origin ([place]) 08/13/2016    Past Surgical History:  Procedure Laterality Date  . BASCILIC VEIN TRANSPOSITION Left 08/07/2016   Procedure: LEFT BASILIC VEIN TRANSPOSITION;  Surgeon: Angelia Mould, MD;  Location: [place];  Service: Vascular;  Laterality: Left;  . CLOSED REDUCTION NASAL FRACTURE N/A 08/11/2019   Procedure: CLOSED REDUCTION NASAL FRACTURE WITH STABILIZATION;  Surgeon: Irene Limbo, MD;  Location: [place];  Service: Plastics;  Laterality: N/A;  . COLONOSCOPY N/A 08/12/2016   Procedure: COLONOSCOPY;  Surgeon: Otis Brace, MD;  Location: [place];  Service: Gastroenterology;  Laterality: N/A;  . ESOPHAGOGASTRODUODENOSCOPY N/A 08/12/2016   Procedure: ESOPHAGOGASTRODUODENOSCOPY (EGD);  Surgeon: Otis Brace, MD;  Location: [facility] [facility];  Service: Gastroenterology;  Laterality: N/A;  . INSERTION OF DIALYSIS CATHETER N/A 08/07/2016   Procedure: INSERTION OF TUNNELED DIALYSIS CATHETER;  Surgeon: Angelia Mould, MD;  Location: [facility];  Service:  Vascular;  Laterality: N/A;  . LIGATION OF ARTERIOVENOUS  FISTULA Left 09/12/2016   Procedure: BANDING OF LEFT  ARTERIOVENOUS  FISTULA;  Surgeon: Angelia Mould, MD;  Location: [facility];  Service: Vascular;  Laterality: Left;  . LOWER EXTREMITY INTERVENTION Right 12/02/2018   Procedure: LOWER EXTREMITY INTERVENTION;   Surgeon: Algernon Huxley, MD;  Location: [facility];  Service: Cardiovascular;  Laterality: Right;    Prior to Admission medications   Medication Sig Start Date End Date Taking? Authorizing Provider  cephALEXin (KEFLEX) 500 MG capsule Take 1 capsule (500 mg total) by mouth 4 (four) times daily. 02/26/21  Yes Kynsley Whitehouse, Charline Bills, PA-C  hydrOXYzine (VISTARIL) 50 MG capsule Take 1 capsule (50 mg total) by mouth 3 (three) times daily as needed. 02/26/21  Yes Tarea Skillman, Charline Bills, PA-C  Lidocaine 3 % CREA Apply 1 application topically 2 (two) times daily as needed (pain). 02/26/21  Yes Vito Beg, Charline Bills, PA-C  AURYXIA 1 GM 210 MG(Fe) tablet Take 420 mg by mouth every Monday, Wednesday, and Friday with hemodialysis. 11/13/20   [provider]  calcium acetate (PHOSLO) 667 MG capsule Take 2 capsules (1,334 mg total) by mouth 3 (three) times daily with meals. 02/12/19   Fritzi Mandes, MD  cinacalcet (SENSIPAR) 90 MG tablet Take 1 tablet by mouth every evening. 11/28/20   [provider]  epoetin alfa (EPOGEN) 4000 UNIT/ML injection Inject 1 mL (4,000 Units total) into the vein every Monday, Wednesday, and Friday with hemodialysis. 06/22/20   Enzo Bi, MD  hydrALAZINE (APRESOLINE) 50 MG tablet Take 1 tablet (50 mg total) by mouth 3 (three) times daily as needed. 01/15/21   Lorella Nimrod, MD  hydrOXYzine (ATARAX/VISTARIL) 10 MG tablet Take 10 mg by mouth 3 (three) times daily as needed. 01/10/21   [provider]  patiromer Daryll Drown) 8.4 g packet Take by mouth. 03/13/20   [provider]  pramoxine (SARNA SENSITIVE) 1 % LOTN Apply 1 application topically 2 (two) times daily. 02/11/21   Rudene Re, MD  SARNA lotion Apply topically. 08/01/20   [provider]  sevelamer carbonate (RENVELA) 800 MG tablet Take 800 mg by mouth 3 (three) times daily with meals.    [provider]  thiamine 100 MG tablet Take 1 tablet (100 mg total) by mouth  daily. Patient taking differently: Take 100 mg by mouth every Monday, Wednesday, and Friday with hemodialysis. 06/20/20   Enzo Bi, MD  triamcinolone cream (KENALOG) 0.1 % SMARTSIG:1 Application Topical 2-3 Times Daily 12/28/20   [provider]    Allergies Lisinopril  Family History  Problem Relation Age of Onset  . Diabetes Mother   . Kidney failure Mother   . Healthy Father   . Kidney failure Brother   . Healthy Sister   . Kidney disease Daughter   . Post-traumatic stress disorder Neg Hx   . Bladder Cancer Neg Hx   . Kidney cancer Neg Hx     Social History Social History   Tobacco Use  . Smoking status: Current Every Day Smoker    Packs/day: 0.50    Years: 40.00    Pack years: 20.00    Types: Cigarettes  . Smokeless tobacco: Never Used  Vaping Use  . Vaping Use: Never used  Substance Use Topics  . Alcohol use: Yes    Alcohol/week: 21.0 standard drinks    Types: 21 Cans of beer per week    Comment: last drink 6 months ago  . Drug  use: No     Review of Systems  Constitutional: No fever/chills Eyes: No visual changes. No discharge ENT: No upper respiratory complaints. Cardiovascular: Left-sided chest pain. Respiratory: Positive for cough.  Positive SOB. Gastrointestinal: No abdominal pain.  No nausea, no vomiting.  No diarrhea.  No constipation. Musculoskeletal: Negative for musculoskeletal pain. Skin: Positive for ongoing pruritus secondary to his known uremic pruritus.  Patient with multiple scabbed sores from scratching Neurological: Negative for headaches, focal weakness or numbness.  10 System ROS otherwise negative.  ____________________________________________   PHYSICAL EXAM:  VITAL SIGNS: ED Triage Vitals  Enc Vitals Group     BP 02/26/21 1600 (!) 141/63     Pulse Rate 02/26/21 1600 (!) 110     Resp 02/26/21 1600 (!) 26     Temp --      Temp src --      SpO2 02/26/21 1600 95 %     Weight 02/26/21 1555 157 lb (71.2 kg)     Height  02/26/21 1555 '5\' 4"'$  (1.626 m)     Head Circumference --      Peak Flow --      Pain Score 02/26/21 1555 0     Pain Loc --      Pain Edu? --      Excl. in [place]? --      Constitutional: Alert and oriented. Well appearing and in no acute distress. Eyes: Conjunctivae are normal. PERRL. EOMI. Head: Atraumatic.  Few scabbed lesions noted to the scalp consistent with excoriation from scratching. ENT:      Ears:       Nose: No congestion/rhinnorhea.      Mouth/Throat: Mucous membranes are moist.  Neck: No stridor.  Neck is supple full range of motion  Cardiovascular: Normal rate, regular rhythm. Normal S1 and S2.  No murmurs, rubs, gallops.  Good peripheral circulation. Respiratory: Normal respiratory effort without tachypnea or retractions. Lungs CTAB with no wheezing, rales or rhonchi. Good air entry to the bases with no decreased or absent breath sounds. Gastrointestinal: Bowel sounds 4 quadrants. Soft and nontender to palpation. No guarding or rigidity. No palpable masses. No distention.  Musculoskeletal: Full range of motion to all extremities. No gross deformities appreciated. Neurologic:  Normal speech and language. No gross focal neurologic deficits are appreciated.  Skin:  Skin is warm, dry and intact. No rash noted. Psychiatric: Mood and affect are normal. Speech and behavior are normal. Patient exhibits appropriate insight and judgement.   ____________________________________________   LABS (all labs ordered are listed, but only abnormal results are displayed)  Labs Reviewed  COMPREHENSIVE METABOLIC PANEL - Abnormal; Notable for the following components:      Result Value   CO2 20 (*)    Glucose, Bld 104 (*)    BUN 88 (*)    Creatinine, Ser 16.45 (*)    Total Bilirubin 1.9 (*)    GFR, Estimated 3 (*)    Anion gap 18 (*)    All other components within normal limits  CBC WITH DIFFERENTIAL/PLATELET - Abnormal; Notable for the following components:   RBC 2.70 (*)     Hemoglobin 8.6 (*)    HCT 26.1 (*)    RDW 19.5 (*)    Platelets 129 (*)    All other components within normal limits  BRAIN NATRIURETIC PEPTIDE - Abnormal; Notable for the following components:   B Natriuretic Peptide 2,730.3 (*)    All other components within normal limits  D-DIMER, QUANTITATIVE - Abnormal;  Notable for the following components:   D-Dimer, Quant 5.73 (*)    All other components within normal limits  TROPONIN I (HIGH SENSITIVITY) - Abnormal; Notable for the following components:   Troponin I (High Sensitivity) 91 (*)    All other components within normal limits  TROPONIN I (HIGH SENSITIVITY) - Abnormal; Notable for the following components:   Troponin I (High Sensitivity) 94 (*)    All other components within normal limits   ____________________________________________  EKG   ____________________________________________  RADIOLOGY I personally viewed and evaluated these images as part of my medical decision making, as well as reviewing the written report by the radiologist.  ED Provider Interpretation: No evidence of cardiopulmonary disease on chest x-ray.  CT angio revealed small pericardial effusion with chronic pulmonary changes.  No evidence of PE.  DG Chest 2 View  Result Date: 02/26/2021 CLINICAL DATA:  Rash and shortness of breath. EXAM: CHEST - 2 VIEW COMPARISON:  Feb 01, 2021 FINDINGS: Mild, predominantly stable diffusely increased interstitial lung markings are seen. There is no evidence of a pleural effusion or pneumothorax. The cardiac silhouette is enlarged. Moderate severity calcification of the aortic arch is noted. The visualized skeletal structures are unremarkable. IMPRESSION: Stable exam without active cardiopulmonary disease. Electronically Signed   By: Virgina Norfolk M.D.   On: 02/26/2021 16:55   CT Angio Chest PE W and/or Wo Contrast  Result Date: 02/26/2021 CLINICAL DATA:  Shortness of breath and leg swelling. Dialysis patient. EXAM: CT  ANGIOGRAPHY CHEST WITH CONTRAST TECHNIQUE: Multidetector CT imaging of the chest was performed using the standard protocol during bolus administration of intravenous contrast. Multiplanar CT image reconstructions and MIPs were obtained to evaluate the vascular anatomy. CONTRAST:  25m OMNIPAQUE IOHEXOL 350 MG/ML SOLN COMPARISON:  Chest radiographs obtained earlier today. Chest CTA dated 11/12/2019 FINDINGS: Cardiovascular: Stable enlarged heart. Interval small pericardial effusion with a maximum thickness of 7 mm. Atheromatous calcifications, including the coronary arteries and aorta. Normally opacified pulmonary arteries with no pulmonary arterial filling defects seen. Mediastinum/Nodes: No enlarged mediastinal, hilar, or axillary lymph nodes. Thyroid gland, trachea, and esophagus demonstrate no significant findings. Lungs/Pleura: Stable diffuse prominence of the interstitial markings with mild centrilobular bullous changes, most pronounced in the right upper lobe. No interval airspace consolidation or pleural fluid. Upper Abdomen: Interval small to moderate amount of free peritoneal fluid. Musculoskeletal: Diffuse bone sclerosis without significant change. Thoracic and lower cervical spine degenerative changes. Review of the MIP images confirms the above findings. IMPRESSION: 1. No pulmonary emboli. 2. Interval small pericardial effusion. 3. Interval small to moderate amount of ascites. 4.  Calcific coronary artery and aortic atherosclerosis. 5. 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 6. 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 Aortic Atherosclerosis (ICD10-I70.0) and Emphysema (ICD10-J43.9). Electronically Signed   By: SClaudie ReveringM.D.   On: 02/26/2021 21:26    ____________________________________________    PROCEDURES  Procedure(s) performed:    Procedures    Medications  loratadine (CLARITIN) tablet 10 mg (10 mg Oral Given  02/26/21 2301)  hydrOXYzine (ATARAX/VISTARIL) tablet 25 mg (25 mg Oral Given 02/26/21 2106)  iohexol (OMNIPAQUE) 350 MG/ML injection 75 mL (75 mLs Intravenous Contrast Given 02/26/21 2117)  lidocaine (XYLOCAINE) 2 % jelly 1 application (1 application Topical Given 02/26/21 2301)       Pulmonary Embolism Rule-out Criteria (PERC rule)  If YES to ANY of the following, the PERC rule is not satisfied and cannot be used to rule out PE in this patient (consider d-dimer or imaging depending on pre-test probability).                      If NO to ALL of the following, AND the clinician's pre-test probability is <15%, the [facility] rule is satisfied and there is no need for further workup (including no need to obtain a d-dimer) as the post-test probability of pulmonary embolism is <2%.                      Mnemonic is HAD CLOTS   H - hormone use (exogenous estrogen)      No. A - age > 38                                                 Yes.   D - DVT/PE history                                      No.   C - coughing blood (hemoptysis)                 No. L - leg swelling, unilateral                             No. O - O2 Sat on Room Air < 95%                  No. T - tachycardia (HR ? 100)                         Yes.   S - surgery or trauma, recent                      No.   Based on my evaluation of the patient, including application of this decision instrument, further testing to evaluate for pulmonary embolism is indicated at this time. I have discussed this recommendation with the patient who states understanding and agreement with this plan.   ____________________________________________   INITIAL IMPRESSION / ASSESSMENT AND PLAN / ED COURSE  Pertinent labs & imaging results that were available during my care of the patient were reviewed by me and considered in my medical decision making (see chart for details).  Review of the [place] CSRS was performed in accordance of the [place]  prior to dispensing any controlled drugs.           Patient's diagnosis is consistent with shortness of breath, end-stage renal disease on dialysis, pericardial effusion, pulmonary emphysema, uremic pruritus.  Patient presented to the emergency department multiple complaints.  Patient initially arrived complaining of shortness of breath.  He had no chest pain at the time.  Patient was also complaining of scattered lesions that were painful secondary to scratching from his known uremic pruritus.  Patient missed dialysis yesterday.  Differential included PE, bronchitis, pneumonia, emphysema, CHF exacerbation, cellulitis.  Patient did return with a positive D-dimer and imaging was obtained.  Patient had a small pericardial effusion.  This is likely secondary  to his end-stage renal disease.  Patient was feeling better symptomatically after receiving oxygen.  He states that he becomes short of breath at home he uses his inhaler and oxygen and typically feels better.  He is currently complaining of no shortness of breath at this time.  Patient's main complaint is pain from the lesions.  These appear to be scattered over the body likely secondary to scratching.  There are a few that appear to be in the beginning stages of cellulitis.  Patient will be treated with Keflex for same.  Topical lidocaine for the painful lesions on his buttocks.  No indication for further work-up currently.  Patient is stable for discharge.  Return precautions discussed with the patient.  Patient is encouraged to go to dialysis tomorrow. Patient is given ED precautions to return to the ED for any worsening or new symptoms.     ____________________________________________  FINAL CLINICAL IMPRESSION(S) / ED DIAGNOSES  Final diagnoses:  Shortness of breath  ESRD on dialysis (HCC)  Pericardial effusion  Pulmonary emphysema, unspecified emphysema type (HCC)  Uremic pruritus      NEW MEDICATIONS STARTED DURING THIS  VISIT:  ED Discharge Orders         Ordered    hydrOXYzine (VISTARIL) 50 MG capsule  3 times daily PRN        02/26/21 2250    Lidocaine 3 % CREA  2 times daily PRN        02/26/21 2250    cephALEXin (KEFLEX) 500 MG capsule  4 times daily        02/26/21 2250              This chart was dictated using voice recognition software/Dragon. Despite best efforts to proofread, errors can occur which can change the meaning. Any change was purely unintentional.    Darletta Moll, PA-C 02/26/21 2341    Nance Pear, MD 02/26/21 [phone]

## 2021-02-26 NOTE — ED Notes (Signed)
Pt given graham crackers, saltines, apple sauce and ice water.

## 2021-02-26 NOTE — ED Notes (Signed)
Patient reports a decrease in the amount of itching he is feeling. Patient is eating applesauce.

## 2021-02-26 NOTE — ED Notes (Signed)
Patient transported to CT scan . 

## 2021-02-26 NOTE — ED Notes (Signed)
Patient asked for O2. Pulse ox was between 92-94% on room air. Patient was placed on 2L O2 via [place]. Patient c/o severe itching on buttocks, back and head. Patient had scattered bumps on those places.

## 2021-03-01 ENCOUNTER — Other Ambulatory Visit: Payer: Self-pay

## 2021-03-01 ENCOUNTER — Emergency Department: Payer: Medicare Other

## 2021-03-01 DIAGNOSIS — Z992 Dependence on renal dialysis: Secondary | ICD-10-CM | POA: Diagnosis not present

## 2021-03-01 DIAGNOSIS — R0602 Shortness of breath: Secondary | ICD-10-CM | POA: Diagnosis present

## 2021-03-01 DIAGNOSIS — E8770 Fluid overload, unspecified: Secondary | ICD-10-CM | POA: Diagnosis not present

## 2021-03-01 DIAGNOSIS — E875 Hyperkalemia: Secondary | ICD-10-CM | POA: Diagnosis not present

## 2021-03-01 DIAGNOSIS — J9611 Chronic respiratory failure with hypoxia: Secondary | ICD-10-CM | POA: Diagnosis not present

## 2021-03-01 DIAGNOSIS — I5043 Acute on chronic combined systolic (congestive) and diastolic (congestive) heart failure: Secondary | ICD-10-CM | POA: Diagnosis not present

## 2021-03-01 DIAGNOSIS — N186 End stage renal disease: Secondary | ICD-10-CM | POA: Diagnosis not present

## 2021-03-01 DIAGNOSIS — Z79899 Other long term (current) drug therapy: Secondary | ICD-10-CM | POA: Diagnosis not present

## 2021-03-01 DIAGNOSIS — I132 Hypertensive heart and chronic kidney disease with heart failure and with stage 5 chronic kidney disease, or end stage renal disease: Secondary | ICD-10-CM | POA: Diagnosis not present

## 2021-03-01 DIAGNOSIS — L89329 Pressure ulcer of left buttock, unspecified stage: Secondary | ICD-10-CM | POA: Insufficient documentation

## 2021-03-01 DIAGNOSIS — Z20822 Contact with and (suspected) exposure to covid-19: Secondary | ICD-10-CM | POA: Diagnosis not present

## 2021-03-01 DIAGNOSIS — F17210 Nicotine dependence, cigarettes, uncomplicated: Secondary | ICD-10-CM | POA: Diagnosis not present

## 2021-03-01 DIAGNOSIS — J449 Chronic obstructive pulmonary disease, unspecified: Secondary | ICD-10-CM | POA: Insufficient documentation

## 2021-03-01 LAB — COMPREHENSIVE METABOLIC PANEL
ALT: 15 U/L (ref 0–44)
AST: 22 U/L (ref 15–41)
Albumin: 3.9 g/dL (ref 3.5–5.0)
Alkaline Phosphatase: 102 U/L (ref 38–126)
Anion gap: 19 — ABNORMAL HIGH (ref 5–15)
BUN: 112 mg/dL — ABNORMAL HIGH (ref 6–20)
CO2: 22 mmol/L (ref 22–32)
Calcium: 9.7 mg/dL (ref 8.9–10.3)
Chloride: 98 mmol/L (ref 98–111)
Creatinine, Ser: 19.98 mg/dL — ABNORMAL HIGH (ref 0.61–1.24)
GFR, Estimated: 2 mL/min — ABNORMAL LOW (ref >60)
Glucose, Bld: 105 mg/dL — ABNORMAL HIGH (ref 70–99)
Potassium: 5.9 mmol/L — ABNORMAL HIGH (ref 3.5–5.1)
Sodium: 139 mmol/L (ref 135–145)
Total Bilirubin: 1.9 mg/dL — ABNORMAL HIGH (ref 0.3–1.2)
Total Protein: 8 g/dL (ref 6.5–8.1)

## 2021-03-01 LAB — CBC WITH DIFFERENTIAL/PLATELET
Abs Immature Granulocytes: 0.05 10*3/uL (ref 0.00–0.07)
Basophils Absolute: 0 10*3/uL (ref 0.0–0.1)
Basophils Relative: 1 %
Eosinophils Absolute: 0.2 10*3/uL (ref 0.0–0.5)
Eosinophils Relative: 3 %
HCT: 26.7 % — ABNORMAL LOW (ref 39.0–52.0)
Hemoglobin: 8.7 g/dL — ABNORMAL LOW (ref 13.0–17.0)
Immature Granulocytes: 1 %
Lymphocytes Relative: 5 %
Lymphs Abs: 0.4 10*3/uL — ABNORMAL LOW (ref 0.7–4.0)
MCH: 31.8 pg (ref 26.0–34.0)
MCHC: 32.6 g/dL (ref 30.0–36.0)
MCV: 97.4 fL (ref 80.0–100.0)
Monocytes Absolute: 0.7 10*3/uL (ref 0.1–1.0)
Monocytes Relative: 9 %
Neutro Abs: 6 10*3/uL (ref 1.7–7.7)
Neutrophils Relative %: 81 %
Platelets: 126 10*3/uL — ABNORMAL LOW (ref 150–400)
RBC: 2.74 MIL/uL — ABNORMAL LOW (ref 4.22–5.81)
RDW: 19.2 % — ABNORMAL HIGH (ref 11.5–15.5)
WBC: 7.4 10*3/uL (ref 4.0–10.5)
nRBC: 0 % (ref 0.0–0.2)

## 2021-03-01 IMAGING — CR DG CHEST 2V
2 series · 2 of 2 positions shown · non-contrast
Comparison: [DATE]

CLINICAL DATA: Shortness of breath.  Dialysis patient.

EXAM:
CHEST - 2 VIEW

[chest lat]
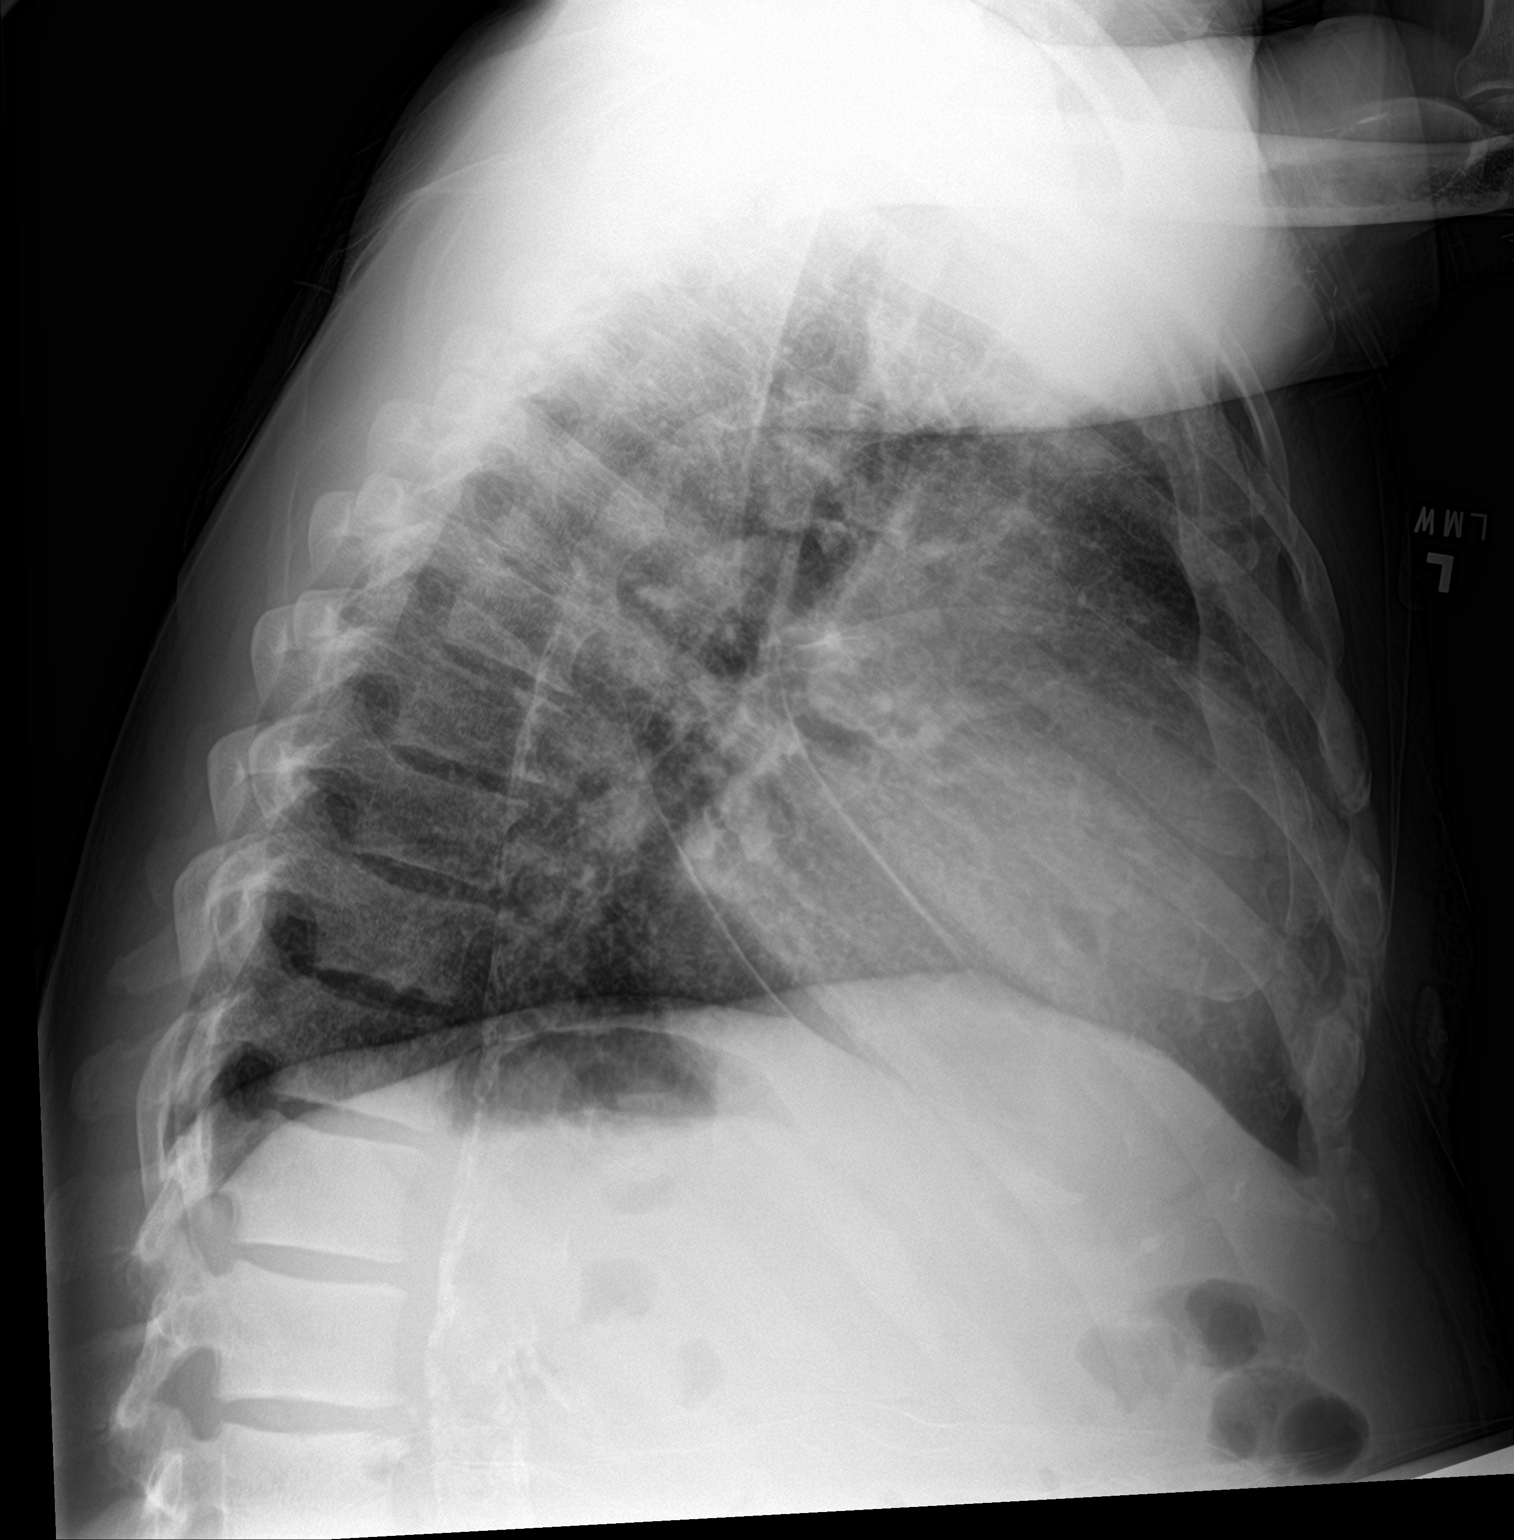

[chest ap]
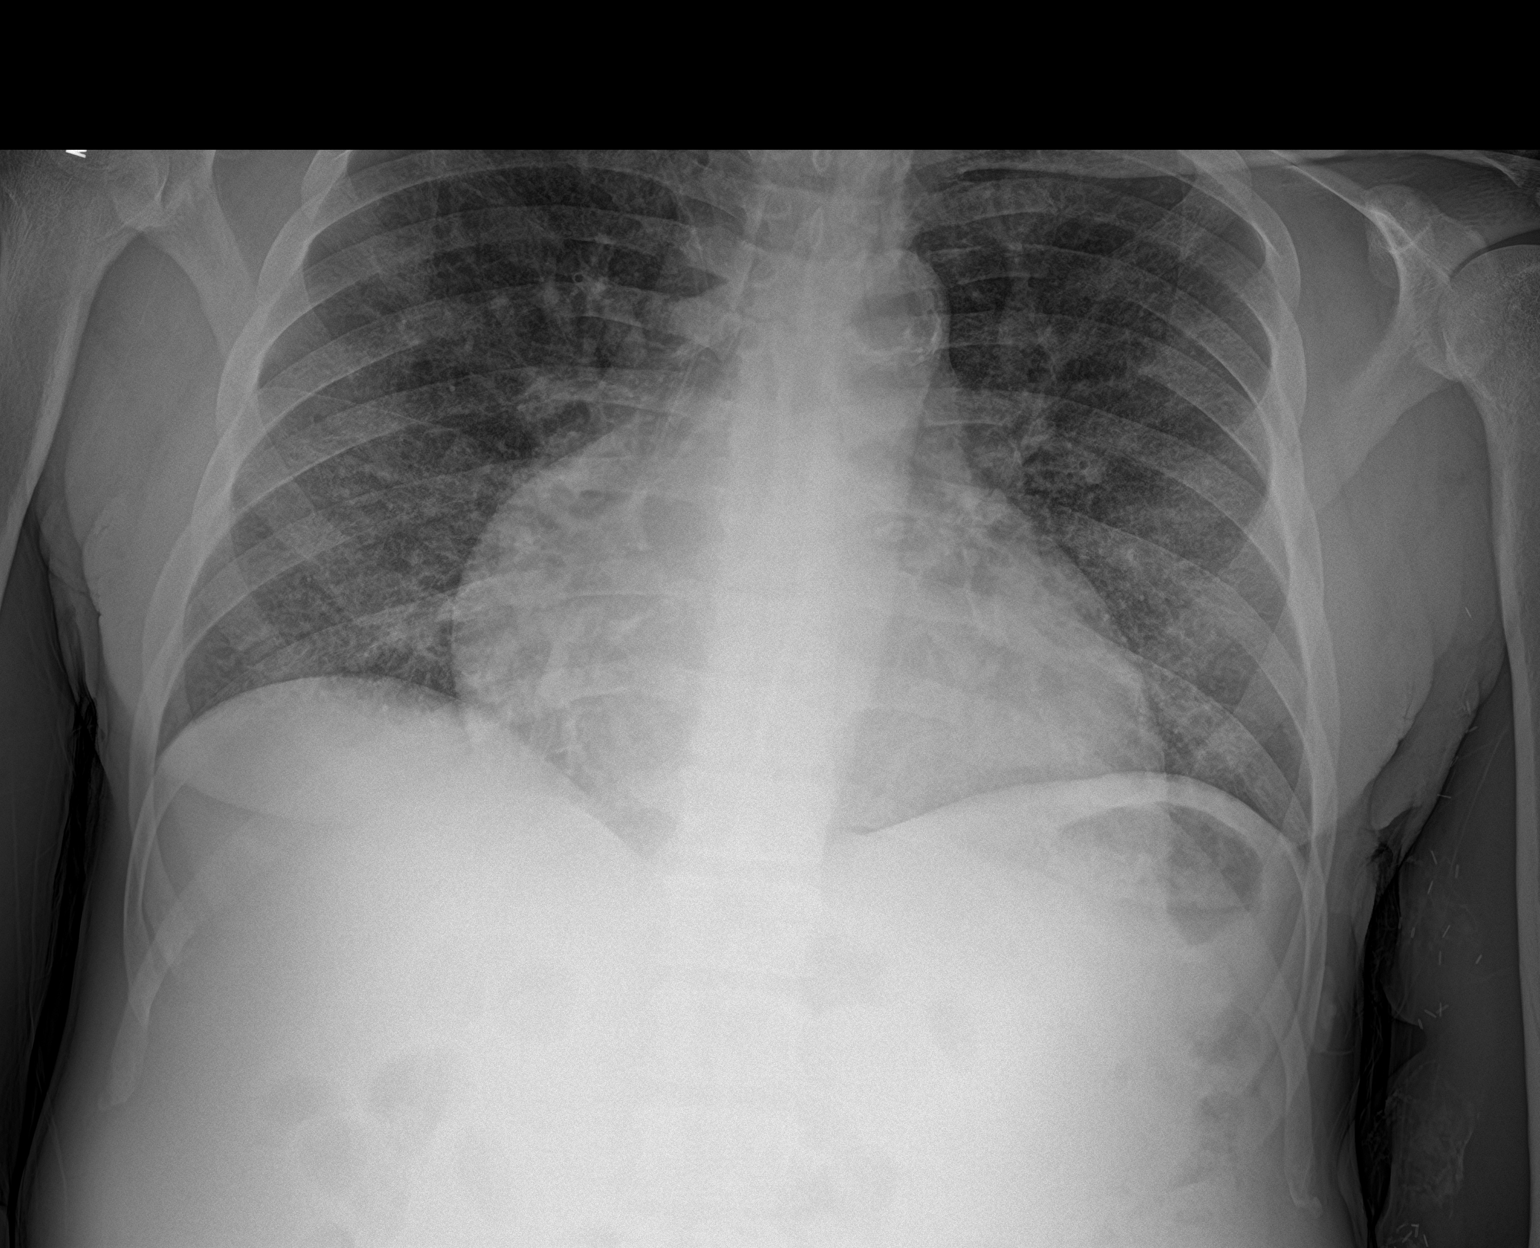

[2 of 2 positions shown; findings below may reference images not displayed]

FINDINGS: Stable cardiomegaly. The hila and mediastinum are unchanged.
Increased interstitial markings in the lungs are similar since
previous studies. No other abnormalities.
IMPRESSION: Stable cardiomegaly.

Stable increased interstitial markings in the lungs are chronic
without overt acute edema or infiltrate.

## 2021-03-01 MED ORDER — ALBUTEROL SULFATE HFA 108 (90 BASE) MCG/ACT IN AERS
2.0000 | INHALATION_SPRAY | RESPIRATORY_TRACT | Status: DC | PRN
  Filled 2021-03-01: qty 6.7

## 2021-03-01 NOTE — ED Triage Notes (Signed)
Pt provided with snacks as requested

## 2021-03-01 NOTE — ED Notes (Signed)
Pt has missed two episodes of dialysis, pt is currently on 4lpm, hr 112, per ems, 164/100 blood pressure per ems. Pt is swollen up to abd per ems.

## 2021-03-01 NOTE — ED Notes (Signed)
Pt is refusing to have VS rechecked. Pt stated,"they already got them back there. I ain't worried about them. I just need some food. Let me talk ton that nurse over there." April, RN aware that pt wants to talk to her.

## 2021-03-01 NOTE — ED Triage Notes (Signed)
Pt presents to ER via ems with c/o SOB x2-3 days.  Pt is a MWF dialysis pt and has missed Wednesday and today.  Pt states he feels like he has fluid on his lungs.  Pt appears to have diff breathing in triage.  Pt also endorses some right side abd pain.  Pt A&O x4 at this time.

## 2021-03-02 ENCOUNTER — Observation Stay
Admission: EM | Admit: 2021-03-02 | Discharge: 2021-03-02 | Disposition: A | Discharge status: Home / Self Care | Payer: Medicare Other | Attending: Family Medicine | Admitting: Family Medicine

## 2021-03-02 ENCOUNTER — Observation Stay (HOSPITAL_BASED_OUTPATIENT_CLINIC_OR_DEPARTMENT_OTHER)
Admit: 2021-03-02 | Discharge: 2021-03-02 | Disposition: A | Discharge status: Home / Self Care | Payer: Medicare Other | Attending: Internal Medicine | Admitting: Internal Medicine

## 2021-03-02 DIAGNOSIS — I509 Heart failure, unspecified: Secondary | ICD-10-CM

## 2021-03-02 DIAGNOSIS — E8770 Fluid overload, unspecified: Secondary | ICD-10-CM | POA: Diagnosis not present

## 2021-03-02 DIAGNOSIS — Z91158 Patient's noncompliance with renal dialysis for other reason: Secondary | ICD-10-CM

## 2021-03-02 DIAGNOSIS — N186 End stage renal disease: Secondary | ICD-10-CM

## 2021-03-02 DIAGNOSIS — J9621 Acute and chronic respiratory failure with hypoxia: Secondary | ICD-10-CM | POA: Diagnosis present

## 2021-03-02 DIAGNOSIS — Z9115 Patient's noncompliance with renal dialysis: Secondary | ICD-10-CM

## 2021-03-02 DIAGNOSIS — L0231 Cutaneous abscess of buttock: Secondary | ICD-10-CM

## 2021-03-02 DIAGNOSIS — J9601 Acute respiratory failure with hypoxia: Secondary | ICD-10-CM

## 2021-03-02 DIAGNOSIS — E875 Hyperkalemia: Secondary | ICD-10-CM

## 2021-03-02 DIAGNOSIS — I5021 Acute systolic (congestive) heart failure: Secondary | ICD-10-CM

## 2021-03-02 DIAGNOSIS — Z992 Dependence on renal dialysis: Secondary | ICD-10-CM

## 2021-03-02 DIAGNOSIS — I5043 Acute on chronic combined systolic (congestive) and diastolic (congestive) heart failure: Secondary | ICD-10-CM

## 2021-03-02 DIAGNOSIS — I10 Essential (primary) hypertension: Secondary | ICD-10-CM

## 2021-03-02 LAB — RESP PANEL BY RT-PCR (FLU A&B, COVID) ARPGX2
Influenza A by PCR: NEGATIVE
Influenza B by PCR: NEGATIVE
SARS Coronavirus 2 by RT PCR: NEGATIVE

## 2021-03-02 LAB — ECHOCARDIOGRAM COMPLETE
AR max vel: 1.64 cm2
AV Peak grad: 13 mmHg
Ao pk vel: 1.8 m/s
Area-P 1/2: 6.12 cm2
S' Lateral: 3.51 cm

## 2021-03-02 MED ORDER — CHLORHEXIDINE GLUCONATE CLOTH 2 % EX PADS
6.0000 | MEDICATED_PAD | Freq: Every day | CUTANEOUS | Status: DC
  Filled 2021-03-02 (×2): qty 6

## 2021-03-02 MED ORDER — ALTEPLASE 2 MG IJ SOLR
2.0000 mg | Freq: Once | INTRAMUSCULAR | Status: DC | PRN
  Filled 2021-03-02: qty 2

## 2021-03-02 MED ORDER — SODIUM CHLORIDE 0.9 % IV SOLN: 250.0000 mL | INTRAVENOUS | Status: DC | PRN

## 2021-03-02 MED ORDER — HEPARIN SODIUM (PORCINE) 5000 UNIT/ML IJ SOLN
5000.0000 [IU] | Freq: Three times a day (TID) | INTRAMUSCULAR | Status: DC
  Administered 2021-03-02: 5000 [IU] via SUBCUTANEOUS
  Filled 2021-03-02: qty 1

## 2021-03-02 MED ORDER — LABETALOL HCL 5 MG/ML IV SOLN: 10.0000 mg | Freq: Once | INTRAVENOUS | Status: DC

## 2021-03-02 MED ORDER — ONDANSETRON HCL 4 MG/2ML IJ SOLN: 4.0000 mg | Freq: Four times a day (QID) | INTRAMUSCULAR | Status: DC | PRN

## 2021-03-02 MED ORDER — DOXYCYCLINE HYCLATE 100 MG PO TABS
100.0000 mg | ORAL_TABLET | Freq: Once | ORAL | Status: AC
  Administered 2021-03-02: 100 mg via ORAL
  Filled 2021-03-02: qty 1

## 2021-03-02 MED ORDER — FUROSEMIDE 10 MG/ML IJ SOLN
40.0000 mg | Freq: Two times a day (BID) | INTRAMUSCULAR | Status: DC
  Administered 2021-03-02: 40 mg via INTRAVENOUS
  Filled 2021-03-02: qty 4

## 2021-03-02 MED ORDER — DOXYCYCLINE HYCLATE 100 MG PO TABS
100.0000 mg | ORAL_TABLET | Freq: Two times a day (BID) | ORAL | Status: DC
  Administered 2021-03-02: 100 mg via ORAL
  Filled 2021-03-02: qty 1

## 2021-03-02 MED ORDER — PENTAFLUOROPROP-TETRAFLUOROETH EX AERO
1.0000 "application " | INHALATION_SPRAY | CUTANEOUS | Status: DC | PRN
  Filled 2021-03-02: qty 30

## 2021-03-02 MED ORDER — SODIUM ZIRCONIUM CYCLOSILICATE 10 G PO PACK
10.0000 g | PACK | ORAL | Status: AC
  Administered 2021-03-02: 10 g via ORAL
  Filled 2021-03-02: qty 1

## 2021-03-02 MED ORDER — DIPHENHYDRAMINE HCL 25 MG PO CAPS
50.0000 mg | ORAL_CAPSULE | Freq: Once | ORAL | Status: AC
  Administered 2021-03-02: 50 mg via ORAL
  Filled 2021-03-02: qty 2

## 2021-03-02 MED ORDER — LIDOCAINE HCL (PF) 1 % IJ SOLN: 5.0000 mL | INTRAMUSCULAR | Status: DC | PRN

## 2021-03-02 MED ORDER — LIDOCAINE-PRILOCAINE 2.5-2.5 % EX CREA: 1.0000 "application " | TOPICAL_CREAM | CUTANEOUS | Status: DC | PRN

## 2021-03-02 MED ORDER — SODIUM CHLORIDE 0.9 % IV SOLN: 100.0000 mL | INTRAVENOUS | Status: DC | PRN

## 2021-03-02 MED ORDER — DIPHENHYDRAMINE HCL 25 MG PO CAPS
25.0000 mg | ORAL_CAPSULE | Freq: Once | ORAL | Status: AC
  Administered 2021-03-02: 25 mg via ORAL
  Filled 2021-03-02: qty 1

## 2021-03-02 MED ORDER — HEPARIN SODIUM (PORCINE) 1000 UNIT/ML DIALYSIS
1000.0000 [IU] | INTRAMUSCULAR | Status: DC | PRN
  Filled 2021-03-02: qty 1

## 2021-03-02 MED ORDER — SODIUM CHLORIDE 0.9% FLUSH: 3.0000 mL | INTRAVENOUS | Status: DC | PRN

## 2021-03-02 MED ORDER — SODIUM CHLORIDE 0.9% FLUSH: 3.0000 mL | Freq: Two times a day (BID) | INTRAVENOUS | Status: DC

## 2021-03-02 MED ORDER — ACETAMINOPHEN 325 MG PO TABS: 650.0000 mg | ORAL_TABLET | ORAL | Status: DC | PRN

## 2021-03-02 NOTE — ED Notes (Signed)
This RN noted pt to be resting in bed with eyes closed, coffee from breakfast tray noted to be spilled all over tray table and floor. This RN asked pt what happened. Pt stated "I fell asleep and spilled it" Pt noted to have taking monitoring devices off at this time, placed all devices back on pt and reeducated importance of keeping them on at this time. Pt verbalized understanding.   Pt in NAD, no needs noted at this time.

## 2021-03-02 NOTE — ED Notes (Signed)
Admitting MD [place] at bedside

## 2021-03-02 NOTE — ED Notes (Signed)
Nephrology at bedside

## 2021-03-02 NOTE — ED Provider Notes (Signed)
[facility] Emergency Department Provider Note  ____________________________________________   Event Date/Time   First MD Initiated Contact with Patient 03/02/21 0217     (approximate)  I have reviewed the triage vital signs and the nursing notes.   HISTORY  Chief Complaint Shortness of Breath    HPI Jordan [name] is a 55 y.o. male with medical history as listed below which notably includes end-stage renal disease on hemodialysis Mondays, Wednesdays, and Fridays.  He presents tonight for evaluation of worsening shortness of breath.  He said he is missed dialysis at least twice this week, once because he went to jail and another time because he did not feel like going in.  He says his shortness of breath is worse with exertion and when he lies flat.  He was 87% on room air and was placed on 2 L of oxygen by nasal cannula.  He still produces urine.  He has no fever, chest pain, nausea, vomiting, nor abdominal pain.  He also has a lesion on his left buttock that has been draining and said that it hurts.  Nothing particular makes the symptoms better.     Past Medical History:  Diagnosis Date   Anemia    Aortic atherosclerosis ([place]) 11/12/2019   CKD (chronic kidney disease)    Stage 5  Dialysis - M/W/F in [place], [place]   Dyspnea    tx with inhaler when sick   ED (erectile dysfunction)    Emphysema of lung ([place]) 11/12/2019   ETOH abuse    History of blood transfusion    Hypertension    Wears dentures     Patient Active Problem List   Diagnosis Date Noted   Abscess of buttock 03/02/2021   Fluid overload 03/02/2021   Multifocal pneumonia 01/13/2021   Current mild episode of major depressive disorder without prior episode ([place]) 04/09/2020   Atrial fibrillation with RVR ([place]) 04/04/2020   Non-compliance with renal dialysis ([place]) 03/21/2020   Acute CHF (congestive heart failure) ([place]) 03/12/2020   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  ([place]) 02/03/2020   Acute on chronic combined systolic and diastolic CHF (congestive heart failure) ([place]) 02/03/2020   Acute respiratory failure with hypoxia ([place]) 11/12/2019   Aortic atherosclerosis ([place]) 11/12/2019   Emphysema of lung ([place]) 11/12/2019   Acute on chronic congestive heart failure ([place]) 10/22/2019   Volume overload 10/05/2019   Anemia in ESRD (end-stage renal disease) ([place]) 05/17/2018   ESRD on dialysis ([place]) 05/26/2017   Essential hypertension 05/26/2017   Moderate protein-calorie malnutrition ([place]) 08/13/2016   Secondary hyperparathyroidism of renal origin ([place]) 08/13/2016    Past Surgical History:  Procedure Laterality Date   BASCILIC VEIN TRANSPOSITION Left 08/07/2016   Procedure: LEFT BASILIC VEIN TRANSPOSITION;  Surgeon: Angelia Mould, MD;  Location: [place];  Service: Vascular;  Laterality: Left;   CLOSED REDUCTION NASAL FRACTURE N/A 08/11/2019   Procedure: CLOSED REDUCTION NASAL FRACTURE WITH STABILIZATION;  Surgeon: Irene Limbo, MD;  Location: [place];  Service: Plastics;  Laterality: N/A;   COLONOSCOPY N/A 08/12/2016   Procedure: COLONOSCOPY;  Surgeon: Otis Brace, MD;  Location: [place];  Service: Gastroenterology;  Laterality: N/A;   ESOPHAGOGASTRODUODENOSCOPY N/A 08/12/2016   Procedure: ESOPHAGOGASTRODUODENOSCOPY (EGD);  Surgeon: Otis Brace, MD;  Location: [place];  Service: Gastroenterology;  Laterality: N/A;   INSERTION OF DIALYSIS CATHETER N/A 08/07/2016   Procedure: INSERTION OF TUNNELED DIALYSIS CATHETER;  Surgeon: Angelia Mould, MD;  Location: [place];  Service:  Vascular;  Laterality: N/A;   LIGATION OF ARTERIOVENOUS  FISTULA Left 09/12/2016   Procedure: BANDING OF LEFT  ARTERIOVENOUS  FISTULA;  Surgeon: Angelia Mould, MD;  Location: [place];  Service: Vascular;  Laterality: Left;   LOWER EXTREMITY INTERVENTION Right 12/02/2018   Procedure: LOWER EXTREMITY INTERVENTION;  Surgeon: Algernon Huxley, MD;  Location: [facility] [facility];  Service: Cardiovascular;  Laterality: Right;    Prior to Admission medications   Medication Sig Start Date End Date Taking? Authorizing Provider  AURYXIA 1 GM 210 MG(Fe) tablet Take 420 mg by mouth every Monday, Wednesday, and Friday with hemodialysis. 11/13/20   [provider]  calcium acetate (PHOSLO) 667 MG capsule Take 2 capsules (1,334 mg total) by mouth 3 (three) times daily with meals. 02/12/19   Fritzi Mandes, MD  cephALEXin (KEFLEX) 500 MG capsule Take 1 capsule (500 mg total) by mouth 4 (four) times daily. 02/26/21   Cuthriell, Charline Bills, PA-C  cinacalcet (SENSIPAR) 90 MG tablet Take 1 tablet by mouth every evening. 11/28/20   [provider]  epoetin alfa (EPOGEN) 4000 UNIT/ML injection Inject 1 mL (4,000 Units total) into the vein every Monday, Wednesday, and Friday with hemodialysis. 06/22/20   Enzo Bi, MD  hydrALAZINE (APRESOLINE) 50 MG tablet Take 1 tablet (50 mg total) by mouth 3 (three) times daily as needed. 01/15/21   Lorella Nimrod, MD  hydrOXYzine (ATARAX/VISTARIL) 10 MG tablet Take 10 mg by mouth 3 (three) times daily as needed. 01/10/21   [provider]  hydrOXYzine (VISTARIL) 50 MG capsule Take 1 capsule (50 mg total) by mouth 3 (three) times daily as needed. 02/26/21   Cuthriell, Charline Bills, PA-C  Lidocaine 3 % CREA Apply 1 application topically 2 (two) times daily as needed (pain). 02/26/21   Cuthriell, Charline Bills, PA-C  patiromer (VELTASSA) 8.4 g packet Take by mouth. 03/13/20   [provider]  pramoxine (SARNA SENSITIVE) 1 % LOTN Apply 1 application topically 2 (two) times daily. 02/11/21   Rudene Re, MD  SARNA lotion Apply topically. 08/01/20   [provider]  sevelamer carbonate (RENVELA) 800 MG tablet Take 800 mg by mouth 3 (three) times daily with meals.    [provider]  thiamine 100 MG tablet Take 1 tablet (100 mg total) by mouth daily. Patient taking differently: Take 100 mg by mouth every  Monday, Wednesday, and Friday with hemodialysis. 06/20/20   Enzo Bi, MD  triamcinolone cream (KENALOG) 0.1 % SMARTSIG:1 Application Topical 2-3 Times Daily 12/28/20   [provider]    Allergies Lisinopril  Family History  Problem Relation Age of Onset   Diabetes Mother    Kidney failure Mother    Healthy Father    Kidney failure Brother    Healthy Sister    Kidney disease Daughter    Post-traumatic stress disorder Neg Hx    Bladder Cancer Neg Hx    Kidney cancer Neg Hx     Social History Social History   Tobacco Use   Smoking status: Every Day    Packs/day: 0.50    Years: 40.00    Pack years: 20.00    Types: Cigarettes   Smokeless tobacco: Never  Vaping Use   Vaping Use: Never used  Substance Use Topics   Alcohol use: Yes    Alcohol/week: 21.0 standard drinks    Types: 21 Cans of beer per week    Comment: last drink 6 months ago   Drug use: No  Review of Systems Constitutional: No fever/chills Eyes: No visual changes. ENT: No sore throat. Cardiovascular: Denies chest pain. Respiratory: Positive for shortness of breath, worse with exertion and lying flat. Gastrointestinal: No abdominal pain.  No nausea, no vomiting.  No diarrhea.  No constipation. Genitourinary: Negative for dysuria. Musculoskeletal: Negative for neck pain.  Negative for back pain. Integumentary: Painful draining lesion on left buttock. Neurological: Negative for headaches, focal weakness or numbness.   ____________________________________________   PHYSICAL EXAM:  VITAL SIGNS: ED Triage Vitals  Enc Vitals Group     BP 03/01/21 2110 (!) 158/93     Pulse Rate 03/01/21 2110 (!) 101     Resp 03/01/21 2110 20     Temp 03/01/21 2110 97.9 F (36.6 C)     Temp Source 03/01/21 2110 Oral     SpO2 03/01/21 2110 93 %     Weight 03/01/21 2111 70.8 kg (156 lb)     Height 03/01/21 2111 1.626 m ('5\' 4"'$ )     Head Circumference --      Peak Flow --      Pain Score 03/01/21 2111 0      Pain Loc --      Pain Edu? --      Excl. in [place]? --     Constitutional: Alert and oriented.  Eyes: Conjunctivae are normal.  Head: Atraumatic. Nose: No congestion/rhinnorhea. Mouth/Throat: Patient is wearing a mask. Neck: No stridor.  No meningeal signs.   Cardiovascular: Normal rate, regular rhythm. Good peripheral circulation. Respiratory: Patient is wearing 2 L of oxygen by nasal cannula.  Coarse breath sounds throughout. Gastrointestinal: Soft and nontender. No distention.  Musculoskeletal: No lower extremity tenderness nor edema. No gross deformities of extremities. Neurologic:  Normal speech and language. No gross focal neurologic deficits are appreciated.  Skin:  Skin is warm and dry.  He has a cutaneous abscess on his left buttock that is open in the middle and draining bloody material.  No surrounding cellulitis.   ____________________________________________   LABS (all labs ordered are listed, but only abnormal results are displayed)  Labs Reviewed  CBC WITH DIFFERENTIAL/PLATELET - Abnormal; Notable for the following components:      Result Value   RBC 2.74 (*)    Hemoglobin 8.7 (*)    HCT 26.7 (*)    RDW 19.2 (*)    Platelets 126 (*)    Lymphs Abs 0.4 (*)    All other components within normal limits  COMPREHENSIVE METABOLIC PANEL - Abnormal; Notable for the following components:   Potassium 5.9 (*)    Glucose, Bld 105 (*)    BUN 112 (*)    Creatinine, Ser 19.98 (*)    Total Bilirubin 1.9 (*)    GFR, Estimated 2 (*)    Anion gap 19 (*)    All other components within normal limits  RESP PANEL BY RT-PCR (FLU A&B, COVID) [id number]   ____________________________________________  EKG  ED ECG REPORT I, Hinda Kehr, the attending physician, personally viewed and interpreted this ECG.  Date: 03/01/2021 EKG Time: 21: 26 Rate: 102 Rhythm: Mild sinus tachycardia QRS Axis: Right axis deviation Intervals: normal ST/T Wave abnormalities: normal Narrative  Interpretation: no evidence of acute ischemia  ____________________________________________  RADIOLOGY I, Hinda Kehr, personally viewed and evaluated these images (plain radiographs) as part of my medical decision making, as well as reviewing the written report by the radiologist.  ED MD interpretation: Increased interstitial markings consistent with pulmonary vascular congestion but no obvious infiltrate  Official radiology report(s): DG Chest 2 View  Result Date: 03/01/2021 CLINICAL DATA:  Shortness of breath.  Dialysis patient. EXAM: CHEST - 2 VIEW COMPARISON:  February 27, 2019 FINDINGS: Stable cardiomegaly. The hila and mediastinum are unchanged. Increased interstitial markings in the lungs are similar since previous studies. No other abnormalities. IMPRESSION: Stable cardiomegaly. Stable increased interstitial markings in the lungs are chronic without overt acute edema or infiltrate. Electronically Signed   By: Dorise Bullion III M.D   On: 03/01/2021 21:50    ____________________________________________   PROCEDURES   Procedure(s) performed (including Critical Care):  Procedures   ____________________________________________   INITIAL IMPRESSION / MDM / [place] / ED COURSE  As part of my medical decision making, I reviewed the following data within the [facility] notes reviewed and incorporated, Labs reviewed , EKG interpreted , Old chart reviewed, Radiograph reviewed , Discussed with admitting physician , and Notes from prior ED visits   Differential diagnosis includes, but is not limited to, noncompliance with dialysis, volume overload, pneumonia, COVID-19.  Patient has missed at least 2 doses of dialysis.  His vital signs are stable but he is hypertensive with a diastolic of about [id number] for which I ordered labetalol 10 mg IV (but subsequently it was held because his blood pressure came down before getting elevated lower).  He is on 2 L of  oxygen by nasal cannula for his mild hypoxemia which is likely due to the volume overload.  He will benefit from dialysis urgently but not emergently.  His potassium is 5.9 and I ordered Lokelma 10 g p.o. but his EKG is reassuring.  CMP is normal other than his renal function and his anion gap is elevated but I suspect that is secondary to his other issues.  CBC is stable.  As per the agreement with the nephrology team, I sent a secure chat message to Dr. Holley Raring who is on-call to make him aware of the patient so that he would find a message when he gets up in the morning rather than wake him up overnight.  I will contact the hospitalist service for admission.  I am starting the patient on doxycycline for his buttock abscess but there is no indication for I&D since it is already open and draining.       Clinical Course as of 03/02/21 0500  Sat Mar 02, 2021  0328 Discussed with Dr. Damita Dunnings with the hospitalist service who will admit. [CF]    Clinical Course User Index [CF] Hinda Kehr, MD     ____________________________________________  FINAL CLINICAL IMPRESSION(S) / ED DIAGNOSES  Final diagnoses:  Acute respiratory failure with hypoxemia ([place])  ESRD on hemodialysis ([place])  Hyperkalemia  Uncontrolled hypertension  Cutaneous abscess of buttock     MEDICATIONS GIVEN DURING THIS VISIT:  Medications  albuterol (VENTOLIN HFA) 108 (90 Base) MCG/ACT inhaler 2 puff (has no administration in time range)  doxycycline (VIBRA-TABS) tablet 100 mg (has no administration in time range)  sodium chloride flush (NS) 0.9 % injection 3 mL (has no administration in time range)  sodium chloride flush (NS) 0.9 % injection 3 mL (has no administration in time range)  0.9 %  sodium chloride infusion (has no administration in time range)  acetaminophen (TYLENOL) tablet 650 mg (has no administration in time range)  ondansetron (ZOFRAN) injection 4 mg (has no administration in time range)  heparin injection  5,000 Units (has no administration in time range)  furosemide (LASIX) injection 40 mg (  40 mg Intravenous Given 03/02/21 0442)  sodium zirconium cyclosilicate (LOKELMA) packet 10 g (10 g Oral Given 03/02/21 0442)  doxycycline (VIBRA-TABS) tablet 100 mg (100 mg Oral Given 03/02/21 0442)     ED Discharge Orders     None        Note:  This document was prepared using Dragon voice recognition software and may include unintentional dictation errors.   Hinda Kehr, MD 03/02/21 0500

## 2021-03-02 NOTE — Progress Notes (Signed)
[facility]  ROUNDING NOTE   Subjective:  Jordan [name] is a 55 years old AA gentleman with medical history of ESRD on HD MWF, CHF, HTN, A. fib and COPD, who presents to the ED with shortness of breath.  His last dialysis was on Monday 6/6.  He was hyperkalemic on admission.He denies nausea, vomiting, anorexia.He is on 2L of  O2 via nasal cannula and SpO2 in 90's. Objective:  Vital signs in last 24 hours:  Temp:  [97.7 F (36.5 C)-98 F (36.7 C)] 98 F (36.7 C) (06/11 0137) Pulse Rate:  [92-102] 92 (06/11 1035) Resp:  [19-21] 19 (06/11 1035) BP: (145-161)/(87-131) 153/100 (06/11 1035) SpO2:  [87 %-99 %] 99 % (06/11 1035) Weight:  [70.8 kg] 70.8 kg (06/10 2111)  Weight change:  Filed Weights   03/01/21 2111  Weight: 70.8 kg    Intake/Output: No intake/output data recorded.   Intake/Output this shift:  No intake/output data recorded.  Physical Exam: General: NAD, sitting at the side of the bed  Head: Normocephalic, atraumatic. Moist oral mucosal membranes  Eyes: Anicteric  Neck: Supple, trachea at midline  Lungs:  Clear to auscultation bilaterally  Heart: Regular rate and rhythm  Abdomen:  Soft, nontender,non distended  Extremities:  No peripheral edema.  Neurologic: Nonfocal, moving all four extremities  Skin: Has some sores on his left buttocks and scalp  Access: Lt upper arm AVF,+bruit,+thrill    Basic Metabolic Panel: Recent Labs  Lab 02/26/21 1700 03/01/21 2120  NA 137 139  K 4.7 5.9*  CL 99 98  CO2 20* 22  GLUCOSE 104* 105*  BUN 88* 112*  CREATININE 16.45* 19.98*  CALCIUM 9.5 9.7    Liver Function Tests: Recent Labs  Lab 02/26/21 1700 03/01/21 2120  AST 23 22  ALT 14 15  ALKPHOS 115 102  BILITOT 1.9* 1.9*  PROT 7.7 8.0  ALBUMIN 3.8 3.9   No results for input(s): LIPASE, AMYLASE in the last 168 hours. No results for input(s): AMMONIA in the last 168 hours.  CBC: Recent Labs  Lab 02/26/21 1700 03/01/21 2120  WBC 9.3  7.4  NEUTROABS 7.4 6.0  HGB 8.6* 8.7*  HCT 26.1* 26.7*  MCV 96.7 97.4  PLT 129* 126*    Cardiac Enzymes: No results for input(s): CKTOTAL, CKMB, CKMBINDEX, TROPONINI in the last 168 hours.  BNP: Invalid input(s): POCBNP  CBG: No results for input(s): GLUCAP in the last 168 hours.  Microbiology: Results for orders placed or performed during the hospital encounter of 03/02/21  Resp Panel by RT-PCR (Flu A&B, Covid) Nasopharyngeal Swab     Status: None   Collection Time: 03/02/21  4:16 AM   Specimen: Nasopharyngeal Swab; Nasopharyngeal(NP) swabs in vial transport medium  Result Value Ref Range Status   SARS Coronavirus 2 by RT PCR NEGATIVE NEGATIVE Final    Comment: (NOTE) SARS-CoV-2 target nucleic acids are NOT DETECTED.  The SARS-CoV-2 RNA is generally detectable in upper respiratory specimens during the acute phase of infection. The lowest concentration of SARS-CoV-2 viral copies this assay can detect is 138 copies/mL. A negative result does not preclude SARS-Cov-2 infection and should not be used as the sole basis for treatment or other patient management decisions. A negative result may occur with  improper specimen collection/handling, submission of specimen other than nasopharyngeal swab, presence of viral mutation(s) within the areas targeted by this assay, and inadequate number of viral copies(<138 copies/mL). A negative result must be combined with clinical observations, patient history,  and epidemiological information. The expected result is Negative.  Fact Sheet for Patients:  EntrepreneurPulse.com.au  Fact Sheet for Healthcare Providers:  IncredibleEmployment.be  This test is no t yet approved or cleared by the [place] FDA and  has been authorized for detection and/or diagnosis of SARS-CoV-2 by FDA under an Emergency Use Authorization (EUA). This EUA will remain  in effect (meaning this test can be used) for the  duration of the COVID-19 declaration under Section 564(b)(1) of the Act, 21 U.S.C.section 360bbb-3(b)(1), unless the authorization is terminated  or revoked sooner.       Influenza A by PCR NEGATIVE NEGATIVE Final   Influenza B by PCR NEGATIVE NEGATIVE Final    Comment: (NOTE) The Xpert Xpress SARS-CoV-2/FLU/RSV plus assay is intended as an aid in the diagnosis of influenza from Nasopharyngeal swab specimens and should not be used as a sole basis for treatment. Nasal washings and aspirates are unacceptable for Xpert Xpress SARS-CoV-2/FLU/RSV testing.  Fact Sheet for Patients: EntrepreneurPulse.com.au  Fact Sheet for Healthcare Providers: IncredibleEmployment.be  This test is not yet approved or cleared by the [place] FDA and has been authorized for detection and/or diagnosis of SARS-CoV-2 by FDA under an Emergency Use Authorization (EUA). This EUA will remain in effect (meaning this test can be used) for the duration of the COVID-19 declaration under Section 564(b)(1) of the Act, 21 U.S.C. section 360bbb-3(b)(1), unless the authorization is terminated or revoked.  Performed at [facility], [address] [place], [place] [address]     Coagulation Studies: No results for input(s): LABPROT, INR in the last 72 hours.  Urinalysis: No results for input(s): COLORURINE, LABSPEC, PHURINE, GLUCOSEU, HGBUR, BILIRUBINUR, KETONESUR, PROTEINUR, UROBILINOGEN, NITRITE, LEUKOCYTESUR in the last 72 hours.  Invalid input(s): APPERANCEUR    Imaging: DG Chest 2 View  Result Date: 03/01/2021 CLINICAL DATA:  Shortness of breath.  Dialysis patient. EXAM: CHEST - 2 VIEW COMPARISON:  February 27, 2019 FINDINGS: Stable cardiomegaly. The hila and mediastinum are unchanged. Increased interstitial markings in the lungs are similar since previous studies. No other abnormalities. IMPRESSION: Stable cardiomegaly. Stable increased interstitial markings in  the lungs are chronic without overt acute edema or infiltrate. Electronically Signed   By: Dorise Bullion III M.D   On: 03/01/2021 21:50     Medications:    sodium chloride     sodium chloride     sodium chloride      Chlorhexidine Gluconate Cloth  6 each Topical Q0600   doxycycline  100 mg Oral Q12H   furosemide  40 mg Intravenous Q12H   heparin  5,000 Units Subcutaneous Q8H   sodium chloride flush  3 mL Intravenous Q12H   sodium chloride, sodium chloride, sodium chloride, acetaminophen, albuterol, alteplase, heparin, lidocaine (PF), lidocaine-prilocaine, ondansetron (ZOFRAN) IV, pentafluoroprop-tetrafluoroeth, sodium chloride flush  Assessment/ Plan:  Jordan [name] is a 56 y.o.  male with medical history of ESRD on HD MWF, CHF, HTN, A. fib and COPD, who presents to the ED with shortness of breath.  His last dialysis was on Monday 6/6.  He was hyperkalemic on admission.He denies nausea, vomiting, anorexia.He is on 2l O2 via nasal cannula and SpO2 in 90's.  #Hyperkalemia Potassium 5.9  Treated with Lokelma in the Emergency department Planning dialysis today   #ESRD in dialysis MWF Last HD was on Monday Dialysis orders prepared for today Patient can be discharged post dialysis from Nephrology stand point  #Hypertension with CKD Blood Pressure levels stay above goal Getting managed  in ED with labetalol and Lasix Continue home antihypertensive regimen on discharge.  #Anemia of CKD Lab Results  Component Value Date   HGB 8.7 (L) 03/01/2021    Will continue Epogen with outpatient dialysis     LOS: 0 Wylie Russon 6/11/202212:33 PM

## 2021-03-02 NOTE — ED Notes (Signed)
Echo at bedside

## 2021-03-02 NOTE — ED Notes (Signed)
Pt urged to place oxygen back on. Pt cussing, states he wants to talk to someone "over you". Pt states is concerned about wait, will not allow  Repeat vitals signs at this time. Annie Main charge rn notified pt wishes to speak to him.

## 2021-03-02 NOTE — Discharge Summary (Signed)
Physician Discharge Summary  Jordan [name] [id number] DOB: 1965/12/06 DOA: 03/02/2021  PCP: Pcp, No  Admit date: 03/02/2021 Discharge date: 03/02/2021  Admitted From: Home  Disposition:  Home   Recommendations for Outpatient Follow-up:  None      Home Health: None  Equipment/Devices: None new  Discharge Condition: Good  CODE STATUS: FULL Diet recommendation: Renal  Brief/Interim Summary: [name] is a 55 year old male with ESRD on HD MWF, chronic combined CHF, HTN, A. fib and COPD who presented with several days progressive shortness of breath.  On Wednesday this past week he was not feeling well so he skipped dialysis.  On Friday he was on his way to dialysis when he was pulled over by a patrol car and missed his dialysis again.  Today he felt short of breath with exertion so he came to the ER.  There chest x-ray showed signs of fluid overload and so he was admitted for dialysis.     PRINCIPAL HOSPITAL DIAGNOSIS: Volume overload due to missed dialysis    Discharge Diagnoses:  Volume overload due to missed dialysis Chronic respiratory failure with hypoxia Chronic systolic and diastolic CHF ESRD on dialysis Hyperkalemia Hypertension COPD The patient presented with shortness of breath and signs of fluid overload on exam.  Acute CHF was ruled out, acute respiratory failure was ruled out.  He was observed overnight and went to dialysis in the morning.  After dialysis he felt comfortable and was stable for discharge.  The patient complained of buttock pain, bilateral buttocks near the gluteal cleft.  On examination of his back and buttocks, he had several scattered ovoid excoriations or shallow round ulcers on his flank, lower back (these were the source of his bleeding).  He was without pus, without surrounding cellulitis, without any signs of gluteal cleft cellulitis or abscess.  He was offered a Mepilex dressing because these appeared to have some resemblance to  pressure ulcers, but he declined.          Discharge Instructions  Discharge Instructions     No wound care   Complete by: As directed       Allergies as of 03/02/2021       Reactions   Lisinopril Swelling        Medication List     TAKE these medications    Auryxia 1 GM 210 MG(Fe) tablet Generic drug: ferric citrate Take 420 mg by mouth every Monday, Wednesday, and Friday with hemodialysis.   calcium acetate 667 MG capsule Commonly known as: PHOSLO Take 2 capsules (1,334 mg total) by mouth 3 (three) times daily with meals.   cephALEXin 500 MG capsule Commonly known as: KEFLEX Take 1 capsule (500 mg total) by mouth 4 (four) times daily.   cinacalcet 90 MG tablet Commonly known as: SENSIPAR Take 1 tablet by mouth every evening.   epoetin alfa 4000 UNIT/ML injection Commonly known as: EPOGEN Inject 1 mL (4,000 Units total) into the vein every Monday, Wednesday, and Friday with hemodialysis.   hydrALAZINE 50 MG tablet Commonly known as: APRESOLINE Take 1 tablet (50 mg total) by mouth 3 (three) times daily as needed.   hydrOXYzine 10 MG tablet Commonly known as: ATARAX/VISTARIL Take 10 mg by mouth 3 (three) times daily as needed.   hydrOXYzine 50 MG capsule Commonly known as: Vistaril Take 1 capsule (50 mg total) by mouth 3 (three) times daily as needed.   Lidocaine 3 % Crea Apply 1 application topically 2 (two) times daily as needed (pain).  patiromer 8.4 g packet Commonly known as: VELTASSA Take by mouth.   pramoxine 1 % Lotn Commonly known as: SARNA SENSITIVE Apply 1 application topically 2 (two) times daily.   Sarna lotion Generic drug: camphor-menthol Apply topically.   sevelamer carbonate 800 MG tablet Commonly known as: RENVELA Take 800 mg by mouth 3 (three) times daily with meals.   thiamine 100 MG tablet Take 1 tablet (100 mg total) by mouth daily. What changed: when to take this   triamcinolone cream 0.1 % Commonly known  as: KENALOG SMARTSIG:1 Application Topical 2-3 Times Daily        Allergies  Allergen Reactions   Lisinopril Swelling       Procedures/Studies: DG Chest 2 View  Result Date: 03/01/2021 CLINICAL DATA:  Shortness of breath.  Dialysis patient. EXAM: CHEST - 2 VIEW COMPARISON:  February 27, 2019 FINDINGS: Stable cardiomegaly. The hila and mediastinum are unchanged. Increased interstitial markings in the lungs are similar since previous studies. No other abnormalities. IMPRESSION: Stable cardiomegaly. Stable increased interstitial markings in the lungs are chronic without overt acute edema or infiltrate. Electronically Signed   By: Dorise Bullion III M.D   On: 03/01/2021 21:50   DG Chest 2 View  Result Date: 02/26/2021 CLINICAL DATA:  Rash and shortness of breath. EXAM: CHEST - 2 VIEW COMPARISON:  Feb 01, 2021 FINDINGS: Mild, predominantly stable diffusely increased interstitial lung markings are seen. There is no evidence of a pleural effusion or pneumothorax. The cardiac silhouette is enlarged. Moderate severity calcification of the aortic arch is noted. The visualized skeletal structures are unremarkable. IMPRESSION: Stable exam without active cardiopulmonary disease. Electronically Signed   By: Virgina Norfolk M.D.   On: 02/26/2021 16:55   DG Chest 2 View  Result Date: 02/01/2021 CLINICAL DATA:  Right-sided chest pain EXAM: CHEST - 2 VIEW COMPARISON:  01/13/2021 FINDINGS: Normal heart size and mediastinal contours. Atheromatous calcification. Interstitial prominence which is similar to prior. Emphysema, airway thickening, and septal thickening has been noted previously. Sclerotic bones in the setting of end-stage renal disease. IMPRESSION: Stable compared to priors.  No acute finding. Electronically Signed   By: Monte Fantasia M.D.   On: 02/01/2021 05:26   CT Angio Chest PE W and/or Wo Contrast  Result Date: 02/26/2021 CLINICAL DATA:  Shortness of breath and leg swelling. Dialysis patient.  EXAM: CT ANGIOGRAPHY CHEST WITH CONTRAST TECHNIQUE: Multidetector CT imaging of the chest was performed using the standard protocol during bolus administration of intravenous contrast. Multiplanar CT image reconstructions and MIPs were obtained to evaluate the vascular anatomy. CONTRAST:  40m OMNIPAQUE IOHEXOL 350 MG/ML SOLN COMPARISON:  Chest radiographs obtained earlier today. Chest CTA dated 11/12/2019 FINDINGS: Cardiovascular: Stable enlarged heart. Interval small pericardial effusion with a maximum thickness of 7 mm. Atheromatous calcifications, including the coronary arteries and aorta. Normally opacified pulmonary arteries with no pulmonary arterial filling defects seen. Mediastinum/Nodes: No enlarged mediastinal, hilar, or axillary lymph nodes. Thyroid gland, trachea, and esophagus demonstrate no significant findings. Lungs/Pleura: Stable diffuse prominence of the interstitial markings with mild centrilobular bullous changes, most pronounced in the right upper lobe. No interval airspace consolidation or pleural fluid. Upper Abdomen: Interval small to moderate amount of free peritoneal fluid. Musculoskeletal: Diffuse bone sclerosis without significant change. Thoracic and lower cervical spine degenerative changes. Review of the MIP images confirms the above findings. IMPRESSION: 1. No pulmonary emboli. 2. Interval small pericardial effusion. 3. Interval small to moderate amount of ascites. 4.  Calcific coronary artery and aortic atherosclerosis. 5.  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 6. 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 Aortic Atherosclerosis (ICD10-I70.0) and Emphysema (ICD10-J43.9). Electronically Signed   By: Claudie Revering M.D.   On: 02/26/2021 21:26   ECHOCARDIOGRAM COMPLETE  Result Date: 03/02/2021    ECHOCARDIOGRAM REPORT   Patient Name:   Jordan [name] Date of Exam: 03/02/2021 Medical Rec #:  [id number]        Height:        64.0 in Accession #:    [id number]       Weight:       156.0 lb Date of Birth:  1965/12/01        BSA:          1.760 m Patient Age:    63 years         BP:           157/105 mmHg Patient Gender: M                HR:           91 bpm. Exam Location:  [facility] Procedure: 2D Echo Indications:     CHF I50.21  History:         Patient has prior history of Echocardiogram examinations, most                  recent 04/05/2020.  Sonographer:     Kathlen Brunswick RDCS Referring Phys:  [id number] Athena Masse Diagnosing Phys: Kate Sable MD IMPRESSIONS  1. Left ventricular ejection fraction, by estimation, is 50%. The left ventricle has low normal function. The left ventricle has no regional wall motion abnormalities. There is mild left ventricular hypertrophy. Left ventricular diastolic parameters are  consistent with Grade II diastolic dysfunction (pseudonormalization).  2. Right ventricular systolic function is mildly reduced. The right ventricular size is mildly enlarged. There is severely elevated pulmonary artery systolic pressure. The estimated right ventricular systolic pressure is 123456 mmHg.  3. Left atrial size was mildly dilated.  4. Right atrial size was mildly dilated.  5. The mitral valve is degenerative. Mild mitral valve regurgitation.  6. Tricuspid valve regurgitation is moderate.  7. The aortic valve is tricuspid. Aortic valve regurgitation is not visualized. Mild aortic valve sclerosis is present, with no evidence of aortic valve stenosis.  8. The inferior vena cava is dilated in size with <50% respiratory variability, suggesting right atrial pressure of 15 mmHg. FINDINGS  Left Ventricle: Left ventricular ejection fraction, by estimation, is 50%. The left ventricle has low normal function. The left ventricle has no regional wall motion abnormalities. The left ventricular internal cavity size was normal in size. There is mild left ventricular hypertrophy. Left ventricular diastolic parameters are  consistent with Grade II diastolic dysfunction (pseudonormalization). Right Ventricle: The right ventricular size is mildly enlarged. No increase in right ventricular wall thickness. Right ventricular systolic function is mildly reduced. There is severely elevated pulmonary artery systolic pressure. The tricuspid regurgitant velocity is 3.68 m/s, and with an assumed right atrial pressure of 15 mmHg, the estimated right ventricular systolic pressure is 123456 mmHg. Left Atrium: Left atrial size was mildly dilated. Right Atrium: Right atrial size was mildly dilated. Pericardium: There is no evidence of pericardial effusion. Mitral Valve: The mitral valve is degenerative in appearance. Mild mitral valve regurgitation. Tricuspid Valve: The tricuspid valve is normal in structure. Tricuspid valve regurgitation is moderate. Aortic Valve: The aortic valve is tricuspid. Aortic valve regurgitation is  not visualized. Mild aortic valve sclerosis is present, with no evidence of aortic valve stenosis. Aortic valve peak gradient measures 13.0 mmHg. Pulmonic Valve: The pulmonic valve was normal in structure. Pulmonic valve regurgitation is not visualized. Aorta: The aortic root is normal in size and structure. Venous: The inferior vena cava is dilated in size with less than 50% respiratory variability, suggesting right atrial pressure of 15 mmHg. IAS/Shunts: No atrial level shunt detected by color flow Doppler.  LEFT VENTRICLE PLAX 2D LVIDd:         4.97 cm  Diastology LVIDs:         3.51 cm  LV e' medial:    6.53 cm/s LV PW:         1.29 cm  LV E/e' medial:  23.3 LV IVS:        1.15 cm  LV e' lateral:   9.46 cm/s LVOT diam:     1.90 cm  LV E/e' lateral: 16.1 LV SV:         52 LV SV Index:   30 LVOT Area:     2.84 cm  RIGHT VENTRICLE RV Basal diam:  4.32 cm RV S prime:     12.00 cm/s TAPSE (M-mode): 1.4 cm LEFT ATRIUM             Index       RIGHT ATRIUM           Index LA diam:        5.20 cm 2.95 cm/m  RA Area:     19.80 cm LA  Vol (A2C):   92.4 ml 52.49 ml/m RA Volume:   61.00 ml  34.65 ml/m LA Vol (A4C):   55.6 ml 31.59 ml/m LA Biplane Vol: 71.8 ml 40.79 ml/m  AORTIC VALVE                PULMONIC VALVE AV Area (Vmax): 1.64 cm    PV Vmax:       1.10 m/s AV Vmax:        180.00 cm/s PV Peak grad:  4.8 mmHg AV Peak Grad:   13.0 mmHg LVOT Vmax:      104.00 cm/s LVOT Vmean:     70.000 cm/s LVOT VTI:       0.184 m  AORTA Ao Root diam: 2.90 cm Ao Asc diam:  2.80 cm MITRAL VALVE                TRICUSPID VALVE MV Area (PHT): 6.12 cm     TV Peak grad:   47.8 mmHg MV Decel Time: 124 msec     TV Vmax:        3.46 m/s MV E velocity: 152.00 cm/s  TR Peak grad:   54.2 mmHg MV A velocity: 64.90 cm/s   TR Vmax:        368.00 cm/s MV E/A ratio:  2.34                             SHUNTS                             Systemic VTI:  0.18 m                             Systemic Diam: 1.90 cm Kate Sable MD Electronically signed by Kate Sable  MD Signature Date/Time: 03/02/2021/2:54:39 PM    Final       Subjective: Breathing is better, no fever, no sputum, no chest pain, his breathing is at baseline.  He still has bilateral lower buttock pain, but this is tolerable.  He is itching. Discharge Exam: Vitals:   03/02/21 1445 03/02/21 1623  BP: 128/78 (!) 144/88  Pulse: 99 98  Resp: (!) 21 20  Temp:    SpO2:  98%   Vitals:   03/02/21 1415 03/02/21 1430 03/02/21 1445 03/02/21 1623  BP: 119/80 (!) 151/97 128/78 (!) 144/88  Pulse: 87 96 99 98  Resp: 17 19 (!) 21 20  Temp:      TempSrc:      SpO2:    98%  Weight:      Height:        General: Pt is alert, awake, not in acute distress Skin: Skin of buttocks is dry and clean.  several scattered ovoid excoriations or shallow round ulcers on his flank, lower back (these were the source of his bleeding).  He was without pus, without surrounding cellulitis, without any signs of gluteal cleft cellulitis or abscess. Cardiovascular: RRR, nl S1-S2, no murmurs appreciated.   No LE edema.    Respiratory: Normal respiratory rate and rhythm.  CTAB without rales or wheezes. Abdominal: Abdomen soft and non-tender.  No distension or HSM.   Neuro/Psych: Strength symmetric in upper and lower extremities.  Judgment and insight appear normal.   The results of significant diagnostics from this hospitalization (including imaging, microbiology, ancillary and laboratory) are listed below for reference.     Microbiology: Recent Results (from the past 240 hour(s))  Resp Panel by RT-PCR (Flu A&B, Covid) Nasopharyngeal Swab     Status: None   Collection Time: 03/02/21  4:16 AM   Specimen: Nasopharyngeal Swab; Nasopharyngeal(NP) swabs in vial transport medium  Result Value Ref Range Status   SARS Coronavirus 2 by RT PCR NEGATIVE NEGATIVE Final    Comment: (NOTE) SARS-CoV-2 target nucleic acids are NOT DETECTED.  The SARS-CoV-2 RNA is generally detectable in upper respiratory specimens during the acute phase of infection. The lowest concentration of SARS-CoV-2 viral copies this assay can detect is 138 copies/mL. A negative result does not preclude SARS-Cov-2 infection and should not be used as the sole basis for treatment or other patient management decisions. A negative result may occur with  improper specimen collection/handling, submission of specimen other than nasopharyngeal swab, presence of viral mutation(s) within the areas targeted by this assay, and inadequate number of viral copies(<138 copies/mL). A negative result must be combined with clinical observations, patient history, and epidemiological information. The expected result is Negative.  Fact Sheet for Patients:  EntrepreneurPulse.com.au  Fact Sheet for Healthcare Providers:  IncredibleEmployment.be  This test is no t yet approved or cleared by the [place] FDA and  has been authorized for detection and/or diagnosis of SARS-CoV-2 by FDA under an Emergency Use Authorization  (EUA). This EUA will remain  in effect (meaning this test can be used) for the duration of the COVID-19 declaration under Section 564(b)(1) of the Act, 21 U.S.C.section 360bbb-3(b)(1), unless the authorization is terminated  or revoked sooner.       Influenza A by PCR NEGATIVE NEGATIVE Final   Influenza B by PCR NEGATIVE NEGATIVE Final    Comment: (NOTE) The Xpert Xpress SARS-CoV-2/FLU/RSV plus assay is intended as an aid in the diagnosis of influenza from Nasopharyngeal swab specimens and should not be used as  a sole basis for treatment. Nasal washings and aspirates are unacceptable for Xpert Xpress SARS-CoV-2/FLU/RSV testing.  Fact Sheet for Patients: EntrepreneurPulse.com.au  Fact Sheet for Healthcare Providers: IncredibleEmployment.be  This test is not yet approved or cleared by the [place] FDA and has been authorized for detection and/or diagnosis of SARS-CoV-2 by FDA under an Emergency Use Authorization (EUA). This EUA will remain in effect (meaning this test can be used) for the duration of the COVID-19 declaration under Section 564(b)(1) of the Act, 21 U.S.C. section 360bbb-3(b)(1), unless the authorization is terminated or revoked.  Performed at [facility], [address] [place], [place] [address]      Labs: BNP (last 3 results) Recent Labs    03/12/20 1643 03/21/20 0310 02/26/21 1700  BNP >4,500.0* 3,603.5* 123456*   Basic Metabolic Panel: Recent Labs  Lab 02/26/21 1700 03/01/21 2120  NA 137 139  K 4.7 5.9*  CL 99 98  CO2 20* 22  GLUCOSE 104* 105*  BUN 88* 112*  CREATININE 16.45* 19.98*  CALCIUM 9.5 9.7   Liver Function Tests: Recent Labs  Lab 02/26/21 1700 03/01/21 2120  AST 23 22  ALT 14 15  ALKPHOS 115 102  BILITOT 1.9* 1.9*  PROT 7.7 8.0  ALBUMIN 3.8 3.9   No results for input(s): LIPASE, AMYLASE in the last 168 hours. No results for input(s): AMMONIA in the last 168  hours. CBC: Recent Labs  Lab 02/26/21 1700 03/01/21 2120  WBC 9.3 7.4  NEUTROABS 7.4 6.0  HGB 8.6* 8.7*  HCT 26.1* 26.7*  MCV 96.7 97.4  PLT 129* 126*   Cardiac Enzymes: No results for input(s): CKTOTAL, CKMB, CKMBINDEX, TROPONINI in the last 168 hours. BNP: Invalid input(s): POCBNP CBG: No results for input(s): GLUCAP in the last 168 hours. D-Dimer No results for input(s): DDIMER in the last 72 hours. Hgb A1c No results for input(s): HGBA1C in the last 72 hours. Lipid Profile No results for input(s): CHOL, HDL, LDLCALC, TRIG, CHOLHDL, LDLDIRECT in the last 72 hours. Thyroid function studies No results for input(s): TSH, T4TOTAL, T3FREE, THYROIDAB in the last 72 hours.  Invalid input(s): FREET3 Anemia work up No results for input(s): VITAMINB12, FOLATE, FERRITIN, TIBC, IRON, RETICCTPCT in the last 72 hours. Urinalysis    Component Value Date/Time   COLORURINE YELLOW (A) 01/13/2021 0153   APPEARANCEUR CLEAR (A) 01/13/2021 0153   LABSPEC 1.012 01/13/2021 0153   PHURINE 9.0 (H) 01/13/2021 0153   GLUCOSEU 150 (A) 01/13/2021 0153   HGBUR NEGATIVE 01/13/2021 0153   BILIRUBINUR NEGATIVE 01/13/2021 0153   KETONESUR NEGATIVE 01/13/2021 0153   PROTEINUR >=300 (A) 01/13/2021 0153   NITRITE NEGATIVE 01/13/2021 0153   LEUKOCYTESUR NEGATIVE 01/13/2021 0153   Sepsis Labs Invalid input(s): PROCALCITONIN,  WBC,  LACTICIDVEN Microbiology Recent Results (from the past 240 hour(s))  Resp Panel by RT-PCR (Flu A&B, Covid) Nasopharyngeal Swab     Status: None   Collection Time: 03/02/21  4:16 AM   Specimen: Nasopharyngeal Swab; Nasopharyngeal(NP) swabs in vial transport medium  Result Value Ref Range Status   SARS Coronavirus 2 by RT PCR NEGATIVE NEGATIVE Final    Comment: (NOTE) SARS-CoV-2 target nucleic acids are NOT DETECTED.  The SARS-CoV-2 RNA is generally detectable in upper respiratory specimens during the acute phase of infection. The lowest concentration of SARS-CoV-2  viral copies this assay can detect is 138 copies/mL. A negative result does not preclude SARS-Cov-2 infection and should not be used as the sole basis for treatment or other patient  management decisions. A negative result may occur with  improper specimen collection/handling, submission of specimen other than nasopharyngeal swab, presence of viral mutation(s) within the areas targeted by this assay, and inadequate number of viral copies(<138 copies/mL). A negative result must be combined with clinical observations, patient history, and epidemiological information. The expected result is Negative.  Fact Sheet for Patients:  EntrepreneurPulse.com.au  Fact Sheet for Healthcare Providers:  IncredibleEmployment.be  This test is no t yet approved or cleared by the [place] FDA and  has been authorized for detection and/or diagnosis of SARS-CoV-2 by FDA under an Emergency Use Authorization (EUA). This EUA will remain  in effect (meaning this test can be used) for the duration of the COVID-19 declaration under Section 564(b)(1) of the Act, 21 U.S.C.section 360bbb-3(b)(1), unless the authorization is terminated  or revoked sooner.       Influenza A by PCR NEGATIVE NEGATIVE Final   Influenza B by PCR NEGATIVE NEGATIVE Final    Comment: (NOTE) The Xpert Xpress SARS-CoV-2/FLU/RSV plus assay is intended as an aid in the diagnosis of influenza from Nasopharyngeal swab specimens and should not be used as a sole basis for treatment. Nasal washings and aspirates are unacceptable for Xpert Xpress SARS-CoV-2/FLU/RSV testing.  Fact Sheet for Patients: EntrepreneurPulse.com.au  Fact Sheet for Healthcare Providers: IncredibleEmployment.be  This test is not yet approved or cleared by the [place] FDA and has been authorized for detection and/or diagnosis of SARS-CoV-2 by FDA under an Emergency Use Authorization  (EUA). This EUA will remain in effect (meaning this test can be used) for the duration of the COVID-19 declaration under Section 564(b)(1) of the Act, 21 U.S.C. section 360bbb-3(b)(1), unless the authorization is terminated or revoked.  Performed at [facility], [address] [place], [place] [address]      Time coordinating discharge: 30 minutes    30 Day Unplanned Readmission Risk Score    Flowsheet Row ED to Hosp-Admission (Discharged) from 01/12/2021 in [facility]  30 Day Unplanned Readmission Risk Score (%) 19.06 Filed at 01/15/2021 1600       This score is the patient's risk of an unplanned readmission within 30 days of being discharged (0 -100%). The score is based on dignosis, age, lab data, medications, orders, and past utilization.   Low:  0-14.9   Medium: 15-21.9   High: 22-29.9   Extreme: 30 and above             SIGNED:   Edwin Dada, MD  [facility] 03/02/2021, 5:19 PM

## 2021-03-02 NOTE — ED Notes (Signed)
Pt resting at this time. Pt refuses to keep monitoring equipment on at this time. Pt given benadryl to help with itching. Pt waiting for dialysis at this time. NAD noted. Pt denies any further needs at this time.

## 2021-03-02 NOTE — ED Notes (Signed)
Spoke with provider about patient blood pressure. Plan to hold at this time. Will speak with hospitalist if PRN is needed.

## 2021-03-02 NOTE — ED Notes (Signed)
Pt moved to RM 34 for admission holding. Report to [place], Therapist, sports.

## 2021-03-02 NOTE — ED Notes (Signed)
Pt transported to hemodialysis at this time.

## 2021-03-02 NOTE — ED Notes (Signed)
Pt given phone to call ride at this time

## 2021-03-02 NOTE — ED Notes (Signed)
Messaged admitting MD [place] and contacted Dialysis RN Lorriane Shire regarding orders for pt dialysis today.

## 2021-03-02 NOTE — H&P (Signed)
History and Physical    Jordan [name] [id number] DOB: [birth year] DOA: 03/02/2021  PCP: Pcp, No   Patient coming from: Home  I have personally briefly reviewed patient's old medical records in [place]  Chief Complaint: Shortness of breath  HPI: Jordan [name] is a 55 y.o. male with medical history significant for  ESRD on HD MWF, chronic combined CHF, HTN, A. fib and COPD, who presents to the ED with a 2 to 3-day history of shortness of breath.  His last dialysis was on Monday 6/6.  He denies cough, fever or chills and denies chest pain.  Denies lower extremity edema.  Says he feels like there is fluid on his lungs.  Also complains of a sore area on his left buttock that has been oozing blood. ED Course: On arrival in the ED afebrile, BP 161/131, pulse 101 with O2 sat as low as 87% on room air improving to the mid 90s on 2 L.  Blood work significant forPotassium of 5.9, normal bicarb and hemoglobin of 8.7 not far from baseline. EKG, personally viewed and interpreted: Sinus tachycardia at 102 with nonspecific ST-T wave changes Imaging: Chest x-ray stable cardiomegaly.  Stable increased interstitial markings in the lungs are chronic without overt acute edema or infiltrate  Patient was treated with a DuoNeb, labetalol and a dose of Lokelma.  Hospitalist consulted for admission.   Review of Systems: As per HPI otherwise all other systems on review of systems negative.    Past Medical History:  Diagnosis Date   Anemia    Aortic atherosclerosis ([place]) 11/12/2019   CKD (chronic kidney disease)    Stage 5  Dialysis - M/W/F in [place], [place]   Dyspnea    tx with inhaler when sick   ED (erectile dysfunction)    Emphysema of lung ([place]) 11/12/2019   ETOH abuse    History of blood transfusion    Hypertension    Wears dentures     Past Surgical History:  Procedure Laterality Date   BASCILIC VEIN TRANSPOSITION Left 08/07/2016   Procedure: LEFT BASILIC VEIN TRANSPOSITION;   Surgeon: Angelia Mould, MD;  Location: [facility];  Service: Vascular;  Laterality: Left;   CLOSED REDUCTION NASAL FRACTURE N/A 08/11/2019   Procedure: CLOSED REDUCTION NASAL FRACTURE WITH STABILIZATION;  Surgeon: Irene Limbo, MD;  Location: [facility];  Service: Plastics;  Laterality: N/A;   COLONOSCOPY N/A 08/12/2016   Procedure: COLONOSCOPY;  Surgeon: Otis Brace, MD;  Location: [facility];  Service: Gastroenterology;  Laterality: N/A;   ESOPHAGOGASTRODUODENOSCOPY N/A 08/12/2016   Procedure: ESOPHAGOGASTRODUODENOSCOPY (EGD);  Surgeon: Otis Brace, MD;  Location: [facility];  Service: Gastroenterology;  Laterality: N/A;   INSERTION OF DIALYSIS CATHETER N/A 08/07/2016   Procedure: INSERTION OF TUNNELED DIALYSIS CATHETER;  Surgeon: Angelia Mould, MD;  Location: [facility];  Service: Vascular;  Laterality: N/A;   LIGATION OF ARTERIOVENOUS  FISTULA Left 09/12/2016   Procedure: BANDING OF LEFT  ARTERIOVENOUS  FISTULA;  Surgeon: Angelia Mould, MD;  Location: [facility];  Service: Vascular;  Laterality: Left;   LOWER EXTREMITY INTERVENTION Right 12/02/2018   Procedure: LOWER EXTREMITY INTERVENTION;  Surgeon: Algernon Huxley, MD;  Location: [facility];  Service: Cardiovascular;  Laterality: Right;     reports that he has been smoking cigarettes. He has a 20.00 pack-year smoking history. He has never used smokeless tobacco. He reports current alcohol use of about 21.0 standard drinks of alcohol per week. He reports that he does  not use drugs.  Allergies  Allergen Reactions   Lisinopril Swelling    Family History  Problem Relation Age of Onset   Diabetes Mother    Kidney failure Mother    Healthy Father    Kidney failure Brother    Healthy Sister    Kidney disease Daughter    Post-traumatic stress disorder Neg Hx    Bladder Cancer Neg Hx    Kidney cancer Neg Hx       Prior to Admission medications   Medication Sig Start Date End Date Taking? Authorizing  Provider  AURYXIA 1 GM 210 MG(Fe) tablet Take 420 mg by mouth every Monday, Wednesday, and Friday with hemodialysis. 11/13/20   [provider]  calcium acetate (PHOSLO) 667 MG capsule Take 2 capsules (1,334 mg total) by mouth 3 (three) times daily with meals. 02/12/19   Fritzi Mandes, MD  cephALEXin (KEFLEX) 500 MG capsule Take 1 capsule (500 mg total) by mouth 4 (four) times daily. 02/26/21   Cuthriell, Charline Bills, PA-C  cinacalcet (SENSIPAR) 90 MG tablet Take 1 tablet by mouth every evening. 11/28/20   [provider]  epoetin alfa (EPOGEN) 4000 UNIT/ML injection Inject 1 mL (4,000 Units total) into the vein every Monday, Wednesday, and Friday with hemodialysis. 06/22/20   Enzo Bi, MD  hydrALAZINE (APRESOLINE) 50 MG tablet Take 1 tablet (50 mg total) by mouth 3 (three) times daily as needed. 01/15/21   Lorella Nimrod, MD  hydrOXYzine (ATARAX/VISTARIL) 10 MG tablet Take 10 mg by mouth 3 (three) times daily as needed. 01/10/21   [provider]  hydrOXYzine (VISTARIL) 50 MG capsule Take 1 capsule (50 mg total) by mouth 3 (three) times daily as needed. 02/26/21   Cuthriell, Charline Bills, PA-C  Lidocaine 3 % CREA Apply 1 application topically 2 (two) times daily as needed (pain). 02/26/21   Cuthriell, Charline Bills, PA-C  patiromer (VELTASSA) 8.4 g packet Take by mouth. 03/13/20   [provider]  pramoxine (SARNA SENSITIVE) 1 % LOTN Apply 1 application topically 2 (two) times daily. 02/11/21   Rudene Re, MD  SARNA lotion Apply topically. 08/01/20   [provider]  sevelamer carbonate (RENVELA) 800 MG tablet Take 800 mg by mouth 3 (three) times daily with meals.    [provider]  thiamine 100 MG tablet Take 1 tablet (100 mg total) by mouth daily. Patient taking differently: Take 100 mg by mouth every Monday, Wednesday, and Friday with hemodialysis. 06/20/20   Enzo Bi, MD  triamcinolone cream (KENALOG) 0.1 % SMARTSIG:1 Application Topical 2-3 Times Daily  12/28/20   [provider]    Physical Exam: Vitals:   03/01/21 2110 03/01/21 2111 03/01/21 2343 03/02/21 0137  BP: (!) 158/93  (!) 145/87 (!) 161/131  Pulse: (!) 101  (!) 101 (!) 102  Resp: 20  19 (!) 21  Temp: 97.9 F (36.6 C)  97.7 F (36.5 C) 98 F (36.7 C)  TempSrc: Oral  Oral Oral  SpO2: 93%  96% (!) 87%  Weight:  70.8 kg    Height:  '5\' 4"'$  (1.626 m)       Vitals:   03/01/21 2110 03/01/21 2111 03/01/21 2343 03/02/21 0137  BP: (!) 158/93  (!) 145/87 (!) 161/131  Pulse: (!) 101  (!) 101 (!) 102  Resp: 20  19 (!) 21  Temp: 97.9 F (36.6 C)  97.7 F (36.5 C) 98 F (36.7 C)  TempSrc: Oral  Oral Oral  SpO2: 93%  96% Marland Kitchen)  87%  Weight:  70.8 kg    Height:  '5\' 4"'$  (1.626 m)        Constitutional: Alert and oriented x 3 .  Conversational dyspnea HEENT:      Head: Normocephalic and atraumatic.         Eyes: PERLA, EOMI, Conjunctivae are normal. Sclera is non-icteric.       Mouth/Throat: Mucous membranes are moist.       Neck: Supple with no signs of meningismus. Cardiovascular: Tachycardic. No murmurs, gallops, or rubs. 2+ symmetrical distal pulses are present . No JVD. No LE edema Respiratory: Respiratory effort slightly increased.Lungs sounds diminished bilaterally.  Few basilar crackles gastrointestinal: Soft, non tender, and non distended with positive bowel sounds.  Genitourinary: No CVA tenderness. Musculoskeletal: Nontender with normal range of motion in all extremities. No cyanosis, or erythema of extremities. Neurologic:  Face is symmetric. Moving all extremities. No gross focal neurologic deficits . Skin: Skin is warm, dry.  Shallow half centimeter ulcer medial left buttock.  No surrounding erythema, no fluctuance Psychiatric: Mood and affect are normal    Labs on Admission: I have personally reviewed following labs and imaging studies  CBC: Recent Labs  Lab 02/26/21 1700 03/01/21 2120  WBC 9.3 7.4  NEUTROABS 7.4 6.0  HGB 8.6* 8.7*  HCT 26.1*  26.7*  MCV 96.7 97.4  PLT 129* [id number]*   Basic Metabolic Panel: Recent Labs  Lab 02/26/21 1700 03/01/21 2120  NA 137 139  K 4.7 5.9*  CL 99 98  CO2 20* 22  GLUCOSE 104* 105*  BUN 88* 112*  CREATININE 16.45* 19.98*  CALCIUM 9.5 9.7   GFR: Estimated Creatinine Clearance: 3.6 mL/min (A) (by C-G formula based on SCr of 19.98 mg/dL (H)). Liver Function Tests: Recent Labs  Lab 02/26/21 1700 03/01/21 2120  AST 23 22  ALT 14 15  ALKPHOS 115 102  BILITOT 1.9* 1.9*  PROT 7.7 8.0  ALBUMIN 3.8 3.9   No results for input(s): LIPASE, AMYLASE in the last 168 hours. No results for input(s): AMMONIA in the last 168 hours. Coagulation Profile: No results for input(s): INR, PROTIME in the last 168 hours. Cardiac Enzymes: No results for input(s): CKTOTAL, CKMB, CKMBINDEX, TROPONINI in the last 168 hours. BNP (last 3 results) No results for input(s): PROBNP in the last 8760 hours. HbA1C: No results for input(s): HGBA1C in the last 72 hours. CBG: No results for input(s): GLUCAP in the last 168 hours. Lipid Profile: No results for input(s): CHOL, HDL, LDLCALC, TRIG, CHOLHDL, LDLDIRECT in the last 72 hours. Thyroid Function Tests: No results for input(s): TSH, T4TOTAL, FREET4, T3FREE, THYROIDAB in the last 72 hours. Anemia Panel: No results for input(s): VITAMINB12, FOLATE, FERRITIN, TIBC, IRON, RETICCTPCT in the last 72 hours. Urine analysis:    Component Value Date/Time   COLORURINE YELLOW (A) 01/13/2021 0153   APPEARANCEUR CLEAR (A) 01/13/2021 0153   LABSPEC 1.012 01/13/2021 0153   PHURINE 9.0 (H) 01/13/2021 0153   GLUCOSEU 150 (A) 01/13/2021 0153   HGBUR NEGATIVE 01/13/2021 0153   BILIRUBINUR NEGATIVE 01/13/2021 0153   KETONESUR NEGATIVE 01/13/2021 0153   PROTEINUR >=300 (A) 01/13/2021 0153   NITRITE NEGATIVE 01/13/2021 0153   LEUKOCYTESUR NEGATIVE 01/13/2021 0153    Radiological Exams on Admission: DG Chest 2 View  Result Date: 03/01/2021 CLINICAL DATA:  Shortness of  breath.  Dialysis patient. EXAM: CHEST - 2 VIEW COMPARISON:  February 27, 2019 FINDINGS: Stable cardiomegaly. The hila and mediastinum are unchanged. Increased interstitial markings in the  lungs are similar since previous studies. No other abnormalities. IMPRESSION: Stable cardiomegaly. Stable increased interstitial markings in the lungs are chronic without overt acute edema or infiltrate. Electronically Signed   By: Dorise Bullion III M.D   On: 03/01/2021 21:50     Assessment/Plan 55 year old male with history of ESRD on HD MWF, chronic combined CHF, HTN, A. fib and COPD, presenting with a 2 to 3-day history of shortness of breath after missing last 2 dialysis sessions..    Volume overload   Acute respiratory failure with hypoxia (HCC)   Acute on chronic combined systolic and diastolic CHF   ESRD on dialysis, last session 6/6 -Patient with shortness of breath likely related to volume overload from missed dialysis sessions resulting in  mild heart failure exacerbation - Chest x-ray without overt acute edema or infiltrate - Should improve with dialysis in the a.m.  Urgent dialysis not needed - Labetalol for blood pressure control - Nephrology consulted - Last echo was from July 2021 with EF 45 to 50% - We will get updated echo  Hyperkalemia - Secondary to missed dialysis.  No acute EKG changes - Got Lokelma in the emergency room - Continue to monitor  Ulcer left buttock - Started on doxycycline from the ER which we will continue - Wound care  Hypertension - As needed labetalol for now and resume home meds  COPD - DuoNebs as needed    DVT prophylaxis: Heparin Code Status: full code  Family Communication:  none  Disposition Plan: Back to previous home environment Consults called: renal  Status: Observation    Athena Masse MD [facility]     03/02/2021, 3:55 AM

## 2021-03-02 NOTE — ED Notes (Signed)
This RN noted pt to be standing next to bedside. Pt stated "my butt hurts. It feels sore" This RN also noted that prophylactic sacral dressing was thrown on bedside table. MD Danford made aware of continued pain at this time.

## 2021-03-02 NOTE — ED Notes (Signed)
Pt verbalized understanding of d/c instructions at this time. Follow-up care and renal diet pamphlet discussed with pt. Pt denied further questions.  Pt assisted to ED lobby at this time to wait for ride, NAD noted, RR even.   First RN Levada Dy aware of pt in lobby and pt within first RN sight at this time.

## 2021-03-02 NOTE — ED Notes (Signed)
Pt stated he has ride coming to pick him up. MD Danford at bedside discussing d/c with pt. Pt changing into personal clothing at this time.

## 2021-03-02 NOTE — ED Notes (Signed)
Transport called to take pt to hemodialysis

## 2021-03-02 NOTE — ED Notes (Signed)
Pt noted to be standing next to bed at this time with O2 and monitoring devices on the ground again. This RN asked pt what his needs/concerns were. Pt stated "my butt hurts" This RN assessed area, no redness or open wounds noted by this RN at this time. Mepilex pressure dressing placed as prophylactic. Pt also educated about turning in bed ever 1-2 hours as needed to prevent pressure injuries or skin breakdown. Pt verbalized understanding.   Pt placed back on O2 and all monitoring devices. This RN reeducated pt on the importance of leaving all these devices on. Pt verbalized understanding at this time.

## 2021-03-02 NOTE — ED Notes (Addendum)
Pt still c/o butt pain at this time. MD Danford made aware. No new orders at this time. Ascom callback number provided to MD Danford if clarification is needed.

## 2021-03-10 ENCOUNTER — Emergency Department: Payer: Medicare Other

## 2021-03-10 ENCOUNTER — Other Ambulatory Visit: Payer: Self-pay

## 2021-03-10 ENCOUNTER — Observation Stay
Admission: EM | Admit: 2021-03-10 | Discharge: 2021-03-11 | Disposition: A | Discharge status: Home / Self Care | Payer: Medicare Other | Attending: Internal Medicine | Admitting: Internal Medicine

## 2021-03-10 DIAGNOSIS — Z992 Dependence on renal dialysis: Secondary | ICD-10-CM | POA: Diagnosis not present

## 2021-03-10 DIAGNOSIS — I132 Hypertensive heart and chronic kidney disease with heart failure and with stage 5 chronic kidney disease, or end stage renal disease: Secondary | ICD-10-CM | POA: Diagnosis not present

## 2021-03-10 DIAGNOSIS — D631 Anemia in chronic kidney disease: Secondary | ICD-10-CM | POA: Diagnosis present

## 2021-03-10 DIAGNOSIS — E8770 Fluid overload, unspecified: Secondary | ICD-10-CM | POA: Diagnosis not present

## 2021-03-10 DIAGNOSIS — Z20822 Contact with and (suspected) exposure to covid-19: Secondary | ICD-10-CM | POA: Diagnosis not present

## 2021-03-10 DIAGNOSIS — N185 Chronic kidney disease, stage 5: Secondary | ICD-10-CM | POA: Diagnosis present

## 2021-03-10 DIAGNOSIS — I10 Essential (primary) hypertension: Secondary | ICD-10-CM | POA: Diagnosis present

## 2021-03-10 DIAGNOSIS — I1311 Hypertensive heart and chronic kidney disease without heart failure, with stage 5 chronic kidney disease, or end stage renal disease: Secondary | ICD-10-CM | POA: Diagnosis present

## 2021-03-10 DIAGNOSIS — R0602 Shortness of breath: Secondary | ICD-10-CM | POA: Diagnosis present

## 2021-03-10 DIAGNOSIS — N186 End stage renal disease: Secondary | ICD-10-CM | POA: Diagnosis not present

## 2021-03-10 DIAGNOSIS — J449 Chronic obstructive pulmonary disease, unspecified: Secondary | ICD-10-CM | POA: Diagnosis not present

## 2021-03-10 DIAGNOSIS — F17210 Nicotine dependence, cigarettes, uncomplicated: Secondary | ICD-10-CM | POA: Insufficient documentation

## 2021-03-10 DIAGNOSIS — Z79899 Other long term (current) drug therapy: Secondary | ICD-10-CM | POA: Insufficient documentation

## 2021-03-10 DIAGNOSIS — I5043 Acute on chronic combined systolic (congestive) and diastolic (congestive) heart failure: Secondary | ICD-10-CM | POA: Diagnosis not present

## 2021-03-10 DIAGNOSIS — I6782 Cerebral ischemia: Secondary | ICD-10-CM | POA: Diagnosis not present

## 2021-03-10 DIAGNOSIS — E8779 Other fluid overload: Secondary | ICD-10-CM

## 2021-03-10 LAB — CBC
HCT: 23.3 % — ABNORMAL LOW (ref 39.0–52.0)
Hemoglobin: 7.5 g/dL — ABNORMAL LOW (ref 13.0–17.0)
MCH: 32.8 pg (ref 26.0–34.0)
MCHC: 32.2 g/dL (ref 30.0–36.0)
MCV: 101.7 fL — ABNORMAL HIGH (ref 80.0–100.0)
Platelets: 128 10*3/uL — ABNORMAL LOW (ref 150–400)
RBC: 2.29 MIL/uL — ABNORMAL LOW (ref 4.22–5.81)
RDW: 18.3 % — ABNORMAL HIGH (ref 11.5–15.5)
WBC: 7.4 10*3/uL (ref 4.0–10.5)
nRBC: 0 % (ref 0.0–0.2)

## 2021-03-10 LAB — BASIC METABOLIC PANEL
Anion gap: 20 — ABNORMAL HIGH (ref 5–15)
BUN: 97 mg/dL — ABNORMAL HIGH (ref 6–20)
CO2: 23 mmol/L (ref 22–32)
Calcium: 9.7 mg/dL (ref 8.9–10.3)
Chloride: 102 mmol/L (ref 98–111)
Creatinine, Ser: 17.84 mg/dL — ABNORMAL HIGH (ref 0.61–1.24)
GFR, Estimated: 3 mL/min — ABNORMAL LOW (ref >60)
Glucose, Bld: 104 mg/dL — ABNORMAL HIGH (ref 70–99)
Potassium: 5 mmol/L (ref 3.5–5.1)
Sodium: 145 mmol/L (ref 135–145)

## 2021-03-10 LAB — RESP PANEL BY RT-PCR (FLU A&B, COVID) ARPGX2
Influenza A by PCR: NEGATIVE
Influenza B by PCR: NEGATIVE
SARS Coronavirus 2 by RT PCR: NEGATIVE

## 2021-03-10 LAB — TROPONIN I (HIGH SENSITIVITY): Troponin I (High Sensitivity): 221 ng/L (ref <18)

## 2021-03-10 LAB — BRAIN NATRIURETIC PEPTIDE: B Natriuretic Peptide: 2267.3 pg/mL — ABNORMAL HIGH (ref 0.0–100.0)

## 2021-03-10 IMAGING — CR DG CHEST 2V
1 series · 2 of 2 positions shown · non-contrast
Comparison: [DATE]

CLINICAL DATA: Shortness of breath

EXAM:
CHEST - 2 VIEW

[Series 1: dg chest 2 view · 0.14mm/px · 2 of 2 slices shown]
[im 1/2]
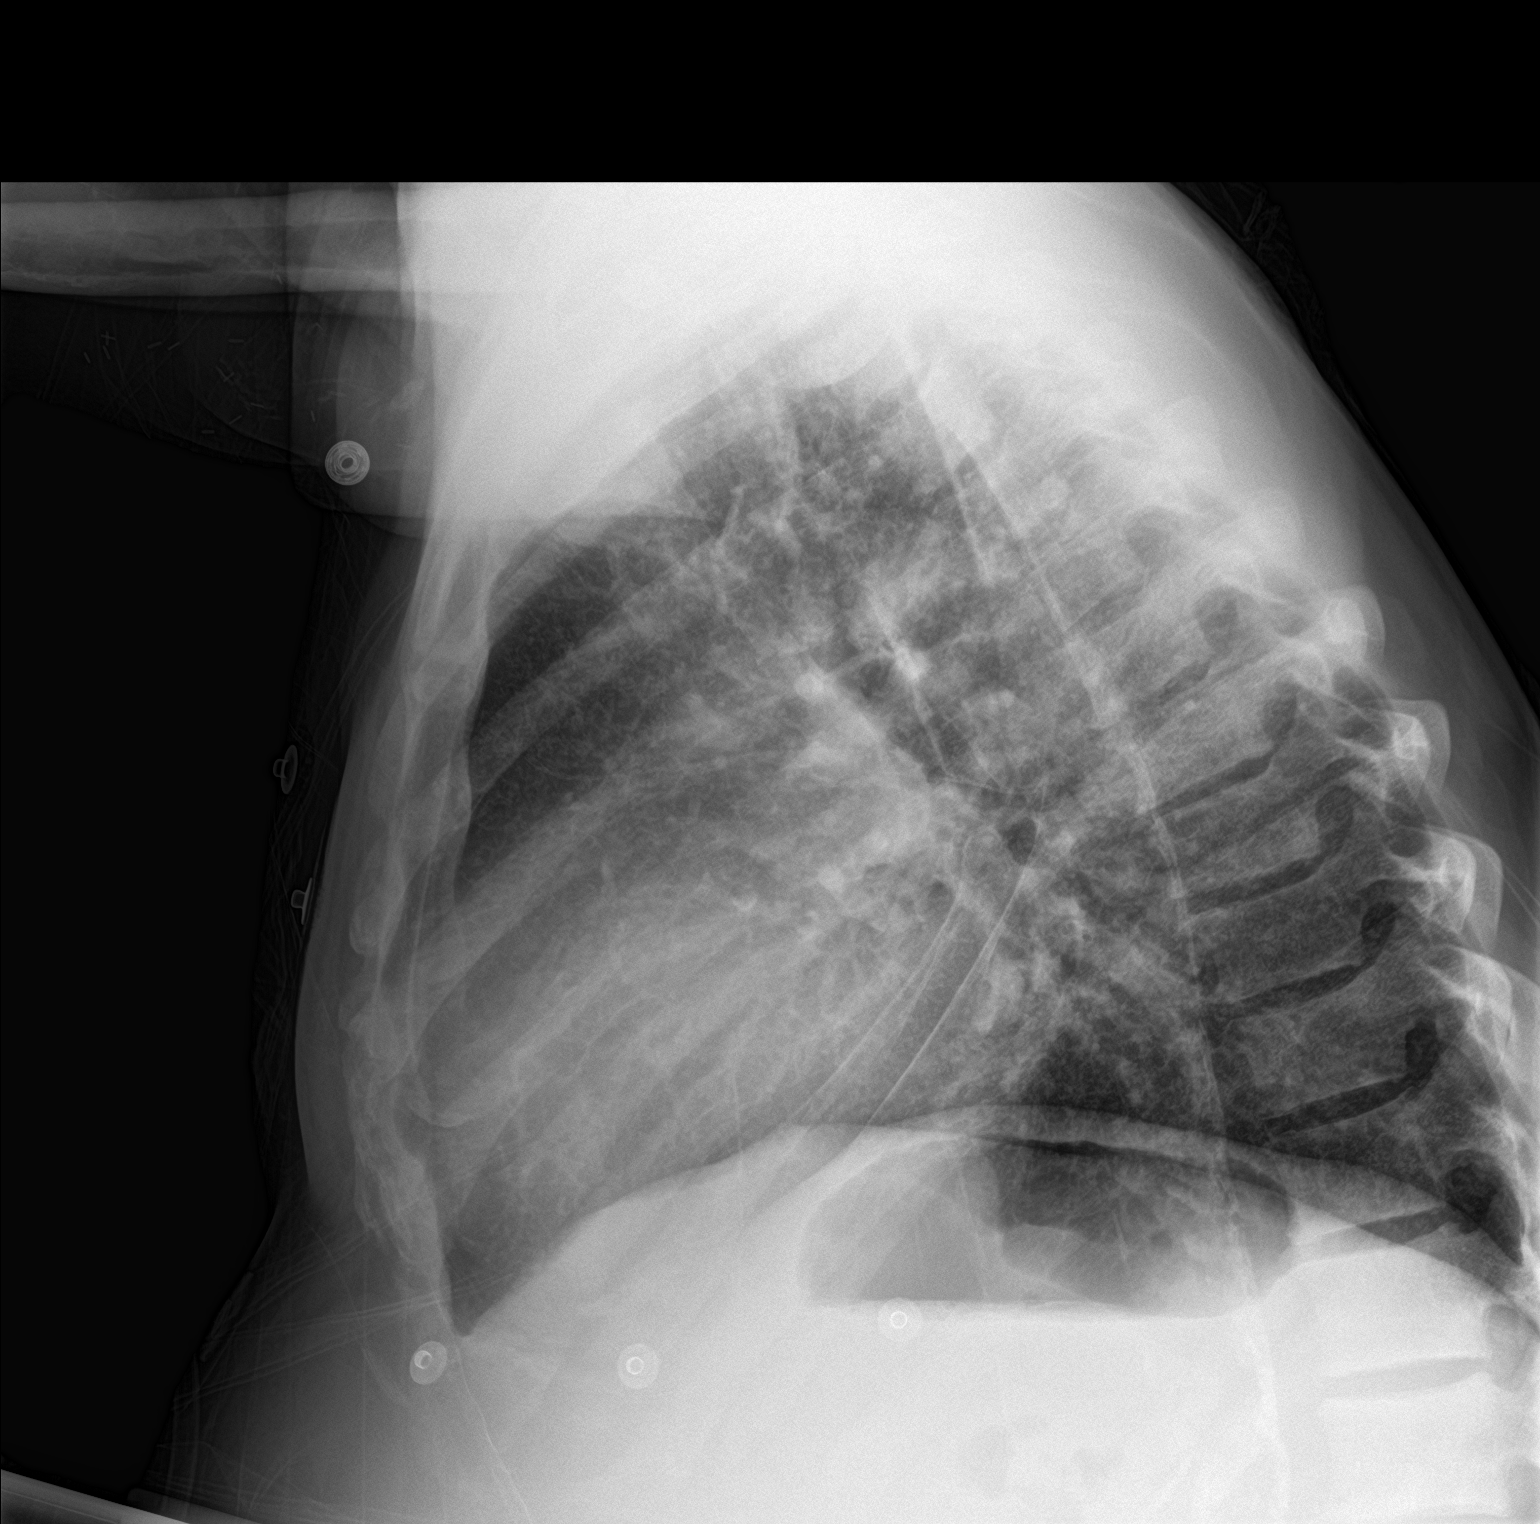
[im 2/2]
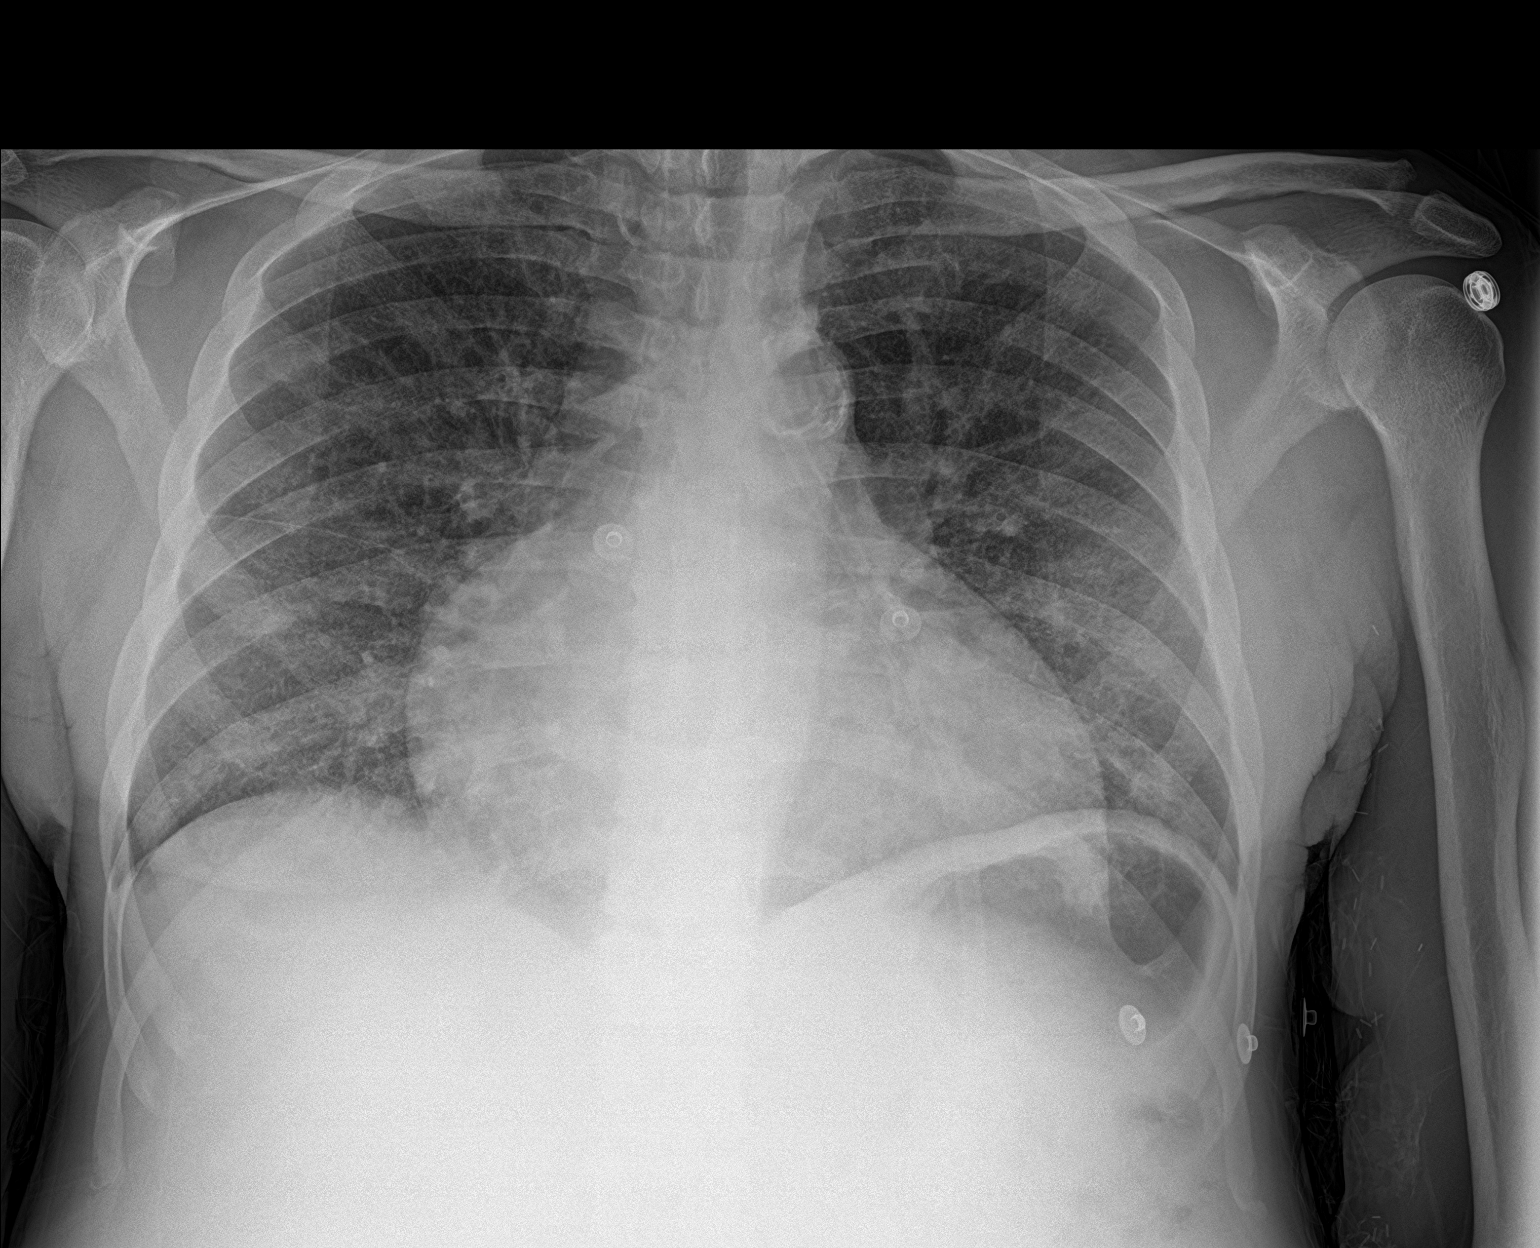

[2 of 2 positions shown; findings below may reference images not displayed]

FINDINGS: Cardiac shadow remains enlarged. Aortic calcifications are again
seen. Vascular congestion is again noted and stable. Interstitial
changes are noted unchanged from the prior exam without focal
infiltrate or sizable effusion. No bony abnormality is noted.
IMPRESSION: Stable interstitial changes of a chronic nature.

No acute abnormality is noted.

## 2021-03-10 MED ORDER — CINACALCET HCL 30 MG PO TABS
90.0000 mg | ORAL_TABLET | Freq: Every evening | ORAL | Status: DC
  Administered 2021-03-11: 90 mg via ORAL
  Filled 2021-03-10 (×2): qty 3

## 2021-03-10 MED ORDER — CALCIUM ACETATE (PHOS BINDER) 667 MG PO CAPS
1334.0000 mg | ORAL_CAPSULE | Freq: Three times a day (TID) | ORAL | Status: DC
  Administered 2021-03-11: 1334 mg via ORAL
  Filled 2021-03-10 (×4): qty 2

## 2021-03-10 MED ORDER — ACETAMINOPHEN 650 MG RE SUPP: 650.0000 mg | Freq: Four times a day (QID) | RECTAL | Status: DC | PRN

## 2021-03-10 MED ORDER — ONDANSETRON HCL 4 MG/2ML IJ SOLN: 4.0000 mg | Freq: Four times a day (QID) | INTRAMUSCULAR | Status: DC | PRN

## 2021-03-10 MED ORDER — SEVELAMER CARBONATE 800 MG PO TABS
800.0000 mg | ORAL_TABLET | Freq: Three times a day (TID) | ORAL | Status: DC
  Administered 2021-03-11: 800 mg via ORAL
  Filled 2021-03-10 (×4): qty 1

## 2021-03-10 MED ORDER — ACETAMINOPHEN 325 MG PO TABS
650.0000 mg | ORAL_TABLET | Freq: Four times a day (QID) | ORAL | Status: DC | PRN
  Administered 2021-03-11: 650 mg via ORAL
  Filled 2021-03-10: qty 2

## 2021-03-10 MED ORDER — HEPARIN SODIUM (PORCINE) 5000 UNIT/ML IJ SOLN
5000.0000 [IU] | Freq: Three times a day (TID) | INTRAMUSCULAR | Status: DC
  Administered 2021-03-11: 5000 [IU] via SUBCUTANEOUS
  Filled 2021-03-10: qty 1

## 2021-03-10 MED ORDER — IPRATROPIUM-ALBUTEROL 0.5-2.5 (3) MG/3ML IN SOLN
3.0000 mL | Freq: Once | RESPIRATORY_TRACT | Status: AC
  Administered 2021-03-10: 3 mL via RESPIRATORY_TRACT
  Filled 2021-03-10: qty 3

## 2021-03-10 MED ORDER — ONDANSETRON HCL 4 MG PO TABS: 4.0000 mg | ORAL_TABLET | Freq: Four times a day (QID) | ORAL | Status: DC | PRN

## 2021-03-10 MED ORDER — HYDRALAZINE HCL 50 MG PO TABS: 50.0000 mg | ORAL_TABLET | Freq: Three times a day (TID) | ORAL | Status: DC | PRN

## 2021-03-10 MED ORDER — HYDROXYZINE HCL 10 MG PO TABS
10.0000 mg | ORAL_TABLET | Freq: Three times a day (TID) | ORAL | Status: DC | PRN
  Filled 2021-03-10: qty 1

## 2021-03-10 MED ORDER — THIAMINE HCL 100 MG PO TABS
100.0000 mg | ORAL_TABLET | Freq: Every day | ORAL | Status: DC
  Administered 2021-03-11: 100 mg via ORAL
  Filled 2021-03-10: qty 1

## 2021-03-10 NOTE — ED Notes (Signed)
CHARGE RN NOTIFIED OF PT'S ELEVATED TROPONIN AND NEED FOR BED.

## 2021-03-10 NOTE — ED Triage Notes (Signed)
Pt arrived via [facility] with reports of shortness of breath and fluid build up, pt is on HD MWF and missed Friday due to [place] in jail.  Pt c/o shortness of breath and fluid build up in his abdomen and legs.  Pt is short of breath when speaking in complete sentences. Edema noted to BLE.   Pt states his last HD was on Wednesday, but not a complete treatment because he was itching so bad.

## 2021-03-10 NOTE — H&P (Signed)
History and Physical   Jordan [name] [id number] DOB: 1966/01/02 DOA: 03/10/2021  PCP: Merryl Hacker, No  Outpatient Specialists: [facility] Patient coming from: Home via EMS  I have personally briefly reviewed patient's old medical records in [name].  Chief Concern: Shortness of breath  HPI: Jordan [name] is a 55 y.o. male with medical history significant for End-stage renal disease on hemodialysis MWF, combined heart failure, hypertension, paroxysmal atrial fibrillation, COPD, noncompliance with dialysis medication, presents to the emergency department for chief concerns of shortness of breath.  He reports the shortness of breath that started 3 days ago. He missed his Friday dialysis because he was in jail for driving without a license on Thursday. He states they were supposed to take him to the hospital on Friday for dialysis but did take him.   They let him home on Friday but was too late for him to go to dialysis.  He reports that shortness of breath is worse with exertion.  Urination: no  Social history: lives by himself. He smokes 3-4 cigarettes per day. He denies etoh and recreational drug use. He is disabled and formerly worked as a Research officer, political party: He received one dose of covid 19, today.   ROS: Constitutional: no weight change, no fever ENT/Mouth: no sore throat, no rhinorrhea Eyes: no eye pain, no vision changes Cardiovascular: no chest pain, + dyspnea,  no edema, no palpitations Respiratory: no cough, no sputum, no wheezing Gastrointestinal: no nausea, no vomiting, no diarrhea, no constipation Genitourinary: no urinary incontinence, no dysuria, no hematuria Musculoskeletal: no arthralgias, no myalgias Skin: no skin lesions, no pruritus, Neuro: + weakness, no loss of consciousness, no syncope Psych: no anxiety, no depression, + decrease appetite Heme/Lymph: no bruising, no bleeding  ED Course: EDP, patient requiring hospitalization  for chief concerns of volume overload from missed dialysis session.  Vitals in the emergency department was remarkable for temperature 98.1, respiration rate 22, blood pressure 147/91, SPO2 2 of 94% on 2 L nasal cannula.  Heart rate 105.  Labs in the emergency was remarkable for serum creatinine of 17.89, BUN 97, nonfasting blood glucose 104, BNP 2267, troponin 221, WBC 7.4, hemoglobin 7.5, platelets 128.  ED called nephrology and they will dialysize him in the a.m.  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 on 03/11/2021  End-stage renal disease on hemodialysis with poor compliance - Cinacalcet 90 mg nightly resumed - Sevelamer 800 mg p.o. 3 times daily -BMP in a.m.  Hypertension-hydralazine 50 mg 3 times daily as needed for SBP greater than 180  Acute on chronic anemia secondary to renal failure - Hemoglobin on admission is 7.5 I suspect this is secondary to patient not receiving EPO during dialysis session as he has not been to dialysis session -CBC in the a.m.  Chronic thrombocytopenia-at baseline  COVID PCR/influenza A/influenza B pending-continue airborne and contact precautions  Chart reviewed.   DVT prophylaxis: Heparin 5000 units every 8 hours subcutaneous Code Status:  Full code Diet: Renal Family Communication: No Disposition Plan: Pending clinical course Consults called: Nephrology Admission status: Observation, progressive cardiac, telemetry  Past Medical  History:  Diagnosis Date   Anemia    Aortic atherosclerosis ([place]) 11/12/2019   CKD (chronic kidney disease)    Stage 5  Dialysis - M/W/F in [place], [place]   Dyspnea    tx with inhaler when sick   ED (erectile dysfunction)    Emphysema of lung ([place]) 11/12/2019   ETOH abuse    History of blood transfusion    Hypertension    Wears dentures    Past Surgical History:  Procedure Laterality Date   BASCILIC VEIN TRANSPOSITION Left 08/07/2016   Procedure: LEFT BASILIC VEIN TRANSPOSITION;  Surgeon: Angelia Mould, MD;  Location: [place];  Service: Vascular;  Laterality: Left;   CLOSED REDUCTION NASAL FRACTURE N/A 08/11/2019   Procedure: CLOSED REDUCTION NASAL FRACTURE WITH STABILIZATION;  Surgeon: Irene Limbo, MD;  Location: [place];  Service: Plastics;  Laterality: N/A;   COLONOSCOPY N/A 08/12/2016   Procedure: COLONOSCOPY;  Surgeon: Otis Brace, MD;  Location: [place];  Service: Gastroenterology;  Laterality: N/A;   ESOPHAGOGASTRODUODENOSCOPY N/A 08/12/2016   Procedure: ESOPHAGOGASTRODUODENOSCOPY (EGD);  Surgeon: Otis Brace, MD;  Location: [place];  Service: Gastroenterology;  Laterality: N/A;   INSERTION OF DIALYSIS CATHETER N/A 08/07/2016   Procedure: INSERTION OF TUNNELED DIALYSIS CATHETER;  Surgeon: Angelia Mould, MD;  Location: [place];  Service: Vascular;  Laterality: N/A;   LIGATION OF ARTERIOVENOUS  FISTULA Left 09/12/2016   Procedure: BANDING OF LEFT  ARTERIOVENOUS  FISTULA;  Surgeon: Angelia Mould, MD;  Location: [place];  Service: Vascular;  Laterality: Left;   LOWER EXTREMITY INTERVENTION Right 12/02/2018   Procedure: LOWER EXTREMITY INTERVENTION;  Surgeon: Algernon Huxley, MD;  Location: [place] [facility];  Service: Cardiovascular;  Laterality: Right;   Social History:  reports that he has been smoking cigarettes. He has a 20.00 pack-year smoking history. He has never used smokeless tobacco. He reports current alcohol use of about 21.0  standard drinks of alcohol per week. He reports that he does not use drugs.  Allergies  Allergen Reactions   Lisinopril Swelling   Family History  Problem Relation Age of Onset   Diabetes Mother    Kidney failure Mother    Healthy Father    Kidney failure Brother    Healthy Sister    Kidney disease Daughter    Post-traumatic stress disorder Neg Hx    Bladder Cancer Neg Hx    Kidney cancer Neg Hx    Family history: Family history reviewed and not pertinent  Prior to Admission medications   Medication Sig Start Date End Date Taking? Authorizing Provider  AURYXIA 1 GM 210 MG(Fe) tablet Take 420 mg by mouth every Monday, Wednesday, and Friday with hemodialysis. 11/13/20   [provider]  calcium acetate (PHOSLO) 667 MG capsule Take 2 capsules (1,334 mg total) by mouth 3 (three) times daily with meals. 02/12/19   Fritzi Mandes, MD  cephALEXin (KEFLEX) 500 MG capsule Take 1 capsule (500 mg total) by mouth 4 (four) times daily. 02/26/21   Cuthriell, Charline Bills, PA-C  cinacalcet (SENSIPAR) 90 MG tablet Take 1 tablet by mouth every evening. 11/28/20   [provider]  epoetin alfa (EPOGEN) 4000 UNIT/ML injection Inject 1 mL (4,000 Units total) into the vein every Monday, Wednesday, and Friday with hemodialysis. 06/22/20   Enzo Bi, MD  hydrALAZINE (APRESOLINE) 50 MG tablet Take  1 tablet (50 mg total) by mouth 3 (three) times daily as needed. 01/15/21   Lorella Nimrod, MD  hydrOXYzine (ATARAX/VISTARIL) 10 MG tablet Take 10 mg by mouth 3 (three) times daily as needed. 01/10/21   [provider]  hydrOXYzine (VISTARIL) 50 MG capsule Take 1 capsule (50 mg total) by mouth 3 (three) times daily as needed. 02/26/21   Cuthriell, Charline Bills, PA-C  Lidocaine 3 % CREA Apply 1 application topically 2 (two) times daily as needed (pain). 02/26/21   Cuthriell, Charline Bills, PA-C  patiromer (VELTASSA) 8.4 g packet Take by mouth. 03/13/20   [provider]  pramoxine (SARNA SENSITIVE) 1 %  LOTN Apply 1 application topically 2 (two) times daily. 02/11/21   Rudene Re, MD  SARNA lotion Apply topically. 08/01/20   [provider]  sevelamer carbonate (RENVELA) 800 MG tablet Take 800 mg by mouth 3 (three) times daily with meals.    [provider]  thiamine 100 MG tablet Take 1 tablet (100 mg total) by mouth daily. 06/20/20   Enzo Bi, MD  triamcinolone cream (KENALOG) 0.1 % SMARTSIG:1 Application Topical 2-3 Times Daily 12/28/20   [provider]   Physical Exam: Vitals:   03/10/21 2116 03/10/21 2119 03/10/21 2123  BP:   (!) 147/91  Pulse:   (!) 105  Resp:   (!) 22  Temp:   98.1 F (36.7 C)  TempSrc:   Oral  SpO2: 95%  95%  Weight:  71.2 kg   Height:  '5\' 4"'$  (1.626 m)    Constitutional: appears older than chronological age, NAD, calm, comfortable Eyes: PERRL, lids and conjunctivae normal ENMT: Mucous membranes are moist. Posterior pharynx clear of any exudate or lesions. Age-appropriate dentition. Hearing appropriate Neck: normal, supple, no masses, no thyromegaly Respiratory: clear to auscultation bilaterally, no wheezing, no crackles. Normal respiratory effort. No accessory muscle use.  Cardiovascular: Regular rate and rhythm, no murmurs / rubs / gallops. No extremity edema. 2+ pedal pulses. No carotid bruits.  Left upper extremity fistula with appropriate bruit Abdomen: obese abdomen, no tenderness, no masses palpated, no hepatosplenomegaly. Bowel sounds positive.  Musculoskeletal: no clubbing / cyanosis. No joint deformity upper and lower extremities. Good ROM, no contractures, no atrophy. Normal muscle tone.  Skin: no rashes, lesions, ulcers. No induration Neurologic: Sensation intact. Strength 5/5 in all 4.  Psychiatric: Normal judgment and insight. Alert and oriented x 3. Normal mood.   EKG: independently reviewed, showing sinus tachycardia with rate of 104, QTc 489  Chest x-ray on Admission: I personally reviewed and I agree with  radiologist reading as below.  DG Chest 2 View  Result Date: 03/10/2021 CLINICAL DATA:  Shortness of breath EXAM: CHEST - 2 VIEW COMPARISON:  03/01/2021 FINDINGS: Cardiac shadow remains enlarged. Aortic calcifications are again seen. Vascular congestion is again noted and stable. Interstitial changes are noted unchanged from the prior exam without focal infiltrate or sizable effusion. No bony abnormality is noted. IMPRESSION: Stable interstitial changes of a chronic nature. No acute abnormality is noted. Electronically Signed   By: Inez Catalina M.D.   On: 03/10/2021 22:02    Labs on Admission: I have personally reviewed following labs  CBC: Recent Labs  Lab 03/10/21 2128  WBC 7.4  HGB 7.5*  HCT 23.3*  MCV 101.7*  PLT 0000000*   Basic Metabolic Panel: Recent Labs  Lab 03/10/21 2128  NA 145  K 5.0  CL 102  CO2 23  GLUCOSE 104*  BUN 97*  CREATININE 17.84*  CALCIUM 9.7   Urine analysis:    Component Value Date/Time   COLORURINE YELLOW (A) 01/13/2021 0153   APPEARANCEUR CLEAR (A) 01/13/2021 0153   LABSPEC 1.012 01/13/2021 0153   PHURINE 9.0 (H) 01/13/2021 0153   GLUCOSEU 150 (A) 01/13/2021 0153   HGBUR NEGATIVE 01/13/2021 0153   BILIRUBINUR NEGATIVE 01/13/2021 0153   KETONESUR NEGATIVE 01/13/2021 0153   PROTEINUR >=300 (A) 01/13/2021 0153   NITRITE NEGATIVE 01/13/2021 0153   LEUKOCYTESUR NEGATIVE 01/13/2021 0153   Florencio Hollibaugh N Norva Bowe D.O. [facility]  If 7PM-7AM, please contact overnight-coverage provider If 7AM-7PM, please contact day coverage provider [email or web address]  03/10/2021, 11:01 PM

## 2021-03-10 NOTE — ED Provider Notes (Signed)
[facility] Emergency Department Provider Note   ____________________________________________   Event Date/Time   First MD Initiated Contact with Patient 03/10/21 2220     (approximate)  I have reviewed the triage vital signs and the nursing notes.   HISTORY  Chief Complaint Shortness of Breath (Missed Dialysis)    HPI Jordan [name] is a 55 y.o. male history of end-stage renal disease, emphysema, alcohol abuse  Patient reports that he was again arrested by the police and missed dialysis.  He reports that similar happened about 2 weeks ago, he reports he was pulled over and while driving without a license in both occasions.  Today reports he was pulled over on Friday could not get to his dialysis session that day, and was briefly in jail and released today.  He reports that he is short of breath, feels very full of fluid as he describes it.  He does occasionally use oxygen 2 L, but oftentimes does not need to use it.  Reports mild shortness of breath.  Feels like he is got a lot of extra fluid on him  No chest pain.  No nausea or vomiting.  No fevers or chills.  Reports was recently in the hospital about 2 weeks ago for same thing  Typically attends dialysis on [place]    Past Medical History:  Diagnosis Date   Anemia    Aortic atherosclerosis ([place]) 11/12/2019   CKD (chronic kidney disease)    Stage 5  Dialysis - M/W/F in [place], [place]   Dyspnea    tx with inhaler when sick   ED (erectile dysfunction)    Emphysema of lung ([place]) 11/12/2019   ETOH abuse    History of blood transfusion    Hypertension    Wears dentures     Patient Active Problem List   Diagnosis Date Noted   Shortness of breath 03/10/2021   Abscess of buttock 03/02/2021   Fluid overload 03/02/2021   Multifocal pneumonia 01/13/2021   Current mild episode of major depressive disorder without prior episode ([place]) 04/09/2020   Atrial fibrillation with RVR ([place]) 04/04/2020    Non-compliance with renal dialysis ([place]) 03/21/2020   Acute CHF (congestive heart failure) ([place]) 03/12/2020   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 ([place]) 02/03/2020   Acute on chronic combined systolic and diastolic CHF (congestive heart failure) ([place]) 02/03/2020   Acute respiratory failure with hypoxia ([place]) 11/12/2019   Aortic atherosclerosis ([place]) 11/12/2019   Emphysema of lung ([place]) 11/12/2019   Acute on chronic congestive heart failure ([place]) 10/22/2019   Volume overload 10/05/2019   Anemia in ESRD (end-stage renal disease) ([place]) 05/17/2018   ESRD on dialysis ([place]) 05/26/2017   Essential hypertension 05/26/2017   Moderate protein-calorie malnutrition ([place]) 08/13/2016   Secondary hyperparathyroidism of renal origin ([place]) 08/13/2016    Past Surgical History:  Procedure Laterality Date   BASCILIC VEIN TRANSPOSITION Left 08/07/2016   Procedure: LEFT BASILIC VEIN TRANSPOSITION;  Surgeon: Angelia Mould, MD;  Location: [place];  Service: Vascular;  Laterality: Left;   CLOSED REDUCTION NASAL FRACTURE N/A 08/11/2019   Procedure: CLOSED REDUCTION NASAL FRACTURE WITH STABILIZATION;  Surgeon: Irene Limbo, MD;  Location: [place];  Service: Plastics;  Laterality: N/A;   COLONOSCOPY N/A 08/12/2016   Procedure: COLONOSCOPY;  Surgeon: Otis Brace, MD;  Location: [place];  Service: Gastroenterology;  Laterality: N/A;   ESOPHAGOGASTRODUODENOSCOPY N/A 08/12/2016   Procedure: ESOPHAGOGASTRODUODENOSCOPY (EGD);  Surgeon: Otis Brace, MD;  Location: [place];  Service: Gastroenterology;  Laterality: N/A;   INSERTION OF DIALYSIS CATHETER N/A 08/07/2016   Procedure: INSERTION OF TUNNELED DIALYSIS CATHETER;  Surgeon: Angelia Mould, MD;  Location: [place];  Service: Vascular;  Laterality: N/A;   LIGATION OF ARTERIOVENOUS  FISTULA Left 09/12/2016   Procedure: BANDING OF LEFT  ARTERIOVENOUS  FISTULA;  Surgeon: Angelia Mould, MD;  Location: [place];   Service: Vascular;  Laterality: Left;   LOWER EXTREMITY INTERVENTION Right 12/02/2018   Procedure: LOWER EXTREMITY INTERVENTION;  Surgeon: Algernon Huxley, MD;  Location: [place] [facility];  Service: Cardiovascular;  Laterality: Right;    Prior to Admission medications   Medication Sig Start Date End Date Taking? Authorizing Provider  ALPRAZolam (XANAX) 0.25 MG tablet Take 0.25 mg by mouth 2 (two) times daily as needed. 02/27/21   [provider]  AURYXIA 1 GM 210 MG(Fe) tablet Take 420 mg by mouth every Monday, Wednesday, and Friday with hemodialysis. 11/13/20   [provider]  cinacalcet (SENSIPAR) 90 MG tablet Take 1 tablet by mouth every evening. 11/28/20   [provider]  epoetin alfa (EPOGEN) 4000 UNIT/ML injection Inject 1 mL (4,000 Units total) into the vein every Monday, Wednesday, and Friday with hemodialysis. 06/22/20   Enzo Bi, MD  hydrALAZINE (APRESOLINE) 50 MG tablet Take 1 tablet (50 mg total) by mouth 3 (three) times daily as needed. 01/15/21   Lorella Nimrod, MD  hydrOXYzine (VISTARIL) 50 MG capsule Take 1 capsule (50 mg total) by mouth 3 (three) times daily as needed. 02/26/21   Cuthriell, Charline Bills, PA-C  Lidocaine 3 % CREA Apply 1 application topically 2 (two) times daily as needed (pain). 02/26/21   Cuthriell, Charline Bills, PA-C  sevelamer carbonate (RENVELA) 800 MG tablet Take 800 mg by mouth 3 (three) times daily with meals.    [provider]    Allergies Lisinopril  Family History  Problem Relation Age of Onset   Diabetes Mother    Kidney failure Mother    Healthy Father    Kidney failure Brother    Healthy Sister    Kidney disease Daughter    Post-traumatic stress disorder Neg Hx    Bladder Cancer Neg Hx    Kidney cancer Neg Hx     Social History Social History   Tobacco Use   Smoking status: Every Day    Packs/day: 0.50    Years: 40.00    Pack years: 20.00    Types: Cigarettes   Smokeless tobacco: Never  Vaping Use    Vaping Use: Never used  Substance Use Topics   Alcohol use: Yes    Alcohol/week: 21.0 standard drinks    Types: 21 Cans of beer per week    Comment: last drink 6 months ago   Drug use: No    Review of Systems Constitutional: No fever/chills Eyes: No visual changes. ENT: No sore throat. Cardiovascular: Denies chest pain. Respiratory: Mild shortness of breath gets better on oxygen.  Denies wheezing.  Slight dry cough for several days.  No vomiting.  No chest pain Gastrointestinal: No abdominal pain.   Genitourinary: Does not make hardly any urine ever, reports he might have a dripper to every once in a while at that Musculoskeletal: Negative for back pain. Skin: Negative for rash. Neurological: Negative for headaches, areas of focal weakness or numbness.    ____________________________________________   PHYSICAL EXAM:  VITAL SIGNS: ED Triage Vitals  Enc Vitals Group     BP 03/10/21 2123 [place] Kitchen)  147/91     Pulse Rate 03/10/21 2123 (!) 105     Resp 03/10/21 2123 (!) 22     Temp 03/10/21 2123 98.1 F (36.7 C)     Temp Source 03/10/21 2123 Oral     SpO2 03/10/21 2116 95 %     Weight 03/10/21 2119 157 lb (71.2 kg)     Height 03/10/21 2119 '5\' 4"'$  (1.626 m)     Head Circumference --      Peak Flow --      Pain Score 03/10/21 2118 0     Pain Loc --      Pain Edu? --      Excl. in [place]? --     Constitutional: Alert and oriented. Well appearing and in no acute distress.  He does have mild increased work of breathing but in no distress.  Laying comfortably on his side without hypoxia on 2 L nasal cannula. Eyes: Conjunctivae are normal. Head: Atraumatic. Nose: No congestion/rhinnorhea. Mouth/Throat: Mucous membranes are moist. Neck: No stridor.  Cardiovascular: Normal rate, regular rhythm. Grossly normal heart sounds.  Good peripheral circulation. Respiratory: Normal respiratory effort except for a mildly increased respiratory rate.  No retractions. Lungs CTAB except for some very  scant and expiratory wheezing. Gastrointestinal: Soft and nontender. No distention. Musculoskeletal: No lower extremity tenderness but does appear edematous in his feet hands Neurologic:  Normal speech and language. No gross focal neurologic deficits are appreciated.  Skin:  Skin is warm, dry and intact. No rash noted. Psychiatric: Mood and affect are normal. Speech and behavior are normal.  ____________________________________________   LABS (all labs ordered are listed, but only abnormal results are displayed)  Labs Reviewed  BASIC METABOLIC PANEL - Abnormal; Notable for the following components:      Result Value   Glucose, Bld 104 (*)    BUN 97 (*)    Creatinine, Ser 17.84 (*)    GFR, Estimated 3 (*)    Anion gap 20 (*)    All other components within normal limits  CBC - Abnormal; Notable for the following components:   RBC 2.29 (*)    Hemoglobin 7.5 (*)    HCT 23.3 (*)    MCV 101.7 (*)    RDW 18.3 (*)    Platelets 128 (*)    All other components within normal limits  BRAIN NATRIURETIC PEPTIDE - Abnormal; Notable for the following components:   B Natriuretic Peptide 2,267.3 (*)    All other components within normal limits  TROPONIN I (HIGH SENSITIVITY) - Abnormal; Notable for the following components:   Troponin I (High Sensitivity) 221 (*)    All other components within normal limits  RESP PANEL BY RT-PCR (FLU A&B, COVID) ARPGX2  BASIC METABOLIC PANEL  CBC  TROPONIN I (HIGH SENSITIVITY)   ____________________________________________  EKG  Reviewed and interpreted by me at 2130 Heart rate 105 QRS 85 QTc 490 Sinus tachycardia without ischemia ____________________________________________  RADIOLOGY  DG Chest 2 View  Result Date: 03/10/2021 CLINICAL DATA:  Shortness of breath EXAM: CHEST - 2 VIEW COMPARISON:  03/01/2021 FINDINGS: Cardiac shadow remains enlarged. Aortic calcifications are again seen. Vascular congestion is again noted and stable. Interstitial  changes are noted unchanged from the prior exam without focal infiltrate or sizable effusion. No bony abnormality is noted. IMPRESSION: Stable interstitial changes of a chronic nature. No acute abnormality is noted. Electronically Signed   By: Inez Catalina M.D.   On: 03/10/2021 22:02    Chest x-ray  reviewed, stable interstitial changes ____________________________________________   PROCEDURES  Procedure(s) performed: None  Procedures  Critical Care performed: No  ____________________________________________   INITIAL IMPRESSION / ASSESSMENT AND PLAN / ED COURSE  Pertinent labs & imaging results that were available during my care of the patient were reviewed by me and considered in my medical decision making (see chart for details).   Patient presents with shortness of breath.  This is in the setting of having missed dialysis appointments.  Clinical exam appears that of volume overload.  He does not appear to be in need of emergent dialysis, but does appear to be in need of rather urgent hemodialysis.  He has been off dialysis now for several days, apparently last hemodialysis session was about the 11th of this month.  I discussed this case with Dr. Juleen China our on-call nephrologist, he advises admission to the hospital we will have hemodialysis team assessed and plan to perform hemodialysis in the morning.  I think this is a reasonable and agreeable plan.  The patient understanding also agreeable with this plan.  Patient does have a mildly elevated troponin however this is in the setting of end-stage renal disease and he denies any chest pain.  Mild dyspnea.  Has a history of same presenting in the past with similar with relief and appears volume overloaded.  Clinically doubt the patient has acute ACS or pulmonary embolism.  Reassuring clinical examination except for volume overload at this time  Admission discussed with Dr. Rupert Stacks of the hospitalist service is understanding agreeable with  plan for admission nephrology consult to follow in the morning as already discussed with Dr. Juleen China      ____________________________________________   FINAL CLINICAL IMPRESSION(S) / ED DIAGNOSES  Final diagnoses:  Other hypervolemia  ESRD (end stage renal disease) on dialysis [facility])        Note:  This document was prepared using Dragon voice recognition software and may include unintentional dictation errors       Delman Kitten, MD 03/10/21 2358

## 2021-03-10 NOTE — ED Notes (Signed)
DR. Jacqualine Code NOTIFIED OF 221 TROPONIN.

## 2021-03-11 ENCOUNTER — Observation Stay: Payer: Medicare Other

## 2021-03-11 DIAGNOSIS — R0602 Shortness of breath: Secondary | ICD-10-CM | POA: Diagnosis not present

## 2021-03-11 LAB — CBC
HCT: 23.9 % — ABNORMAL LOW (ref 39.0–52.0)
Hemoglobin: 7.5 g/dL — ABNORMAL LOW (ref 13.0–17.0)
MCH: 31.5 pg (ref 26.0–34.0)
MCHC: 31.4 g/dL (ref 30.0–36.0)
MCV: 100.4 fL — ABNORMAL HIGH (ref 80.0–100.0)
Platelets: 139 10*3/uL — ABNORMAL LOW (ref 150–400)
RBC: 2.38 MIL/uL — ABNORMAL LOW (ref 4.22–5.81)
RDW: 18.1 % — ABNORMAL HIGH (ref 11.5–15.5)
WBC: 8.4 10*3/uL (ref 4.0–10.5)
nRBC: 0.2 % (ref 0.0–0.2)

## 2021-03-11 LAB — BASIC METABOLIC PANEL
Anion gap: 19 — ABNORMAL HIGH (ref 5–15)
BUN: 94 mg/dL — ABNORMAL HIGH (ref 6–20)
CO2: 24 mmol/L (ref 22–32)
Calcium: 9.4 mg/dL (ref 8.9–10.3)
Chloride: 102 mmol/L (ref 98–111)
Creatinine, Ser: 17.97 mg/dL — ABNORMAL HIGH (ref 0.61–1.24)
GFR, Estimated: 3 mL/min — ABNORMAL LOW (ref >60)
Glucose, Bld: 124 mg/dL — ABNORMAL HIGH (ref 70–99)
Potassium: 4.9 mmol/L (ref 3.5–5.1)
Sodium: 145 mmol/L (ref 135–145)

## 2021-03-11 LAB — PHOSPHORUS: Phosphorus: 10 mg/dL — ABNORMAL HIGH (ref 2.5–4.6)

## 2021-03-11 LAB — TROPONIN I (HIGH SENSITIVITY): Troponin I (High Sensitivity): 220 ng/L (ref <18)

## 2021-03-11 IMAGING — CT CT HEAD W/O CM
3 of 4 series · 15 of 47 positions shown, 18 images · non-contrast
Comparison: [DATE].

CLINICAL DATA: 54 y.o. male with medical history significant for
End-stage renal disease on hemodialysis MWF, combined heart failure,
hypertension, paroxysmal atrial fibrillation, COPD, noncompliance
with dialysis medication, presents to the emergency department for
chief concerns of shortness of breath.

EXAM:
CT HEAD WITHOUT CONTRAST
TECHNIQUE: Contiguous axial images were obtained from the base of the skull
through the vertex without intravenous contrast.

[Series 3: head wo · axial · 0.44mm/px · z∈[-142,-17]mm · 9 of 31 slices shown, 12 images]
[im 3/31  brain]
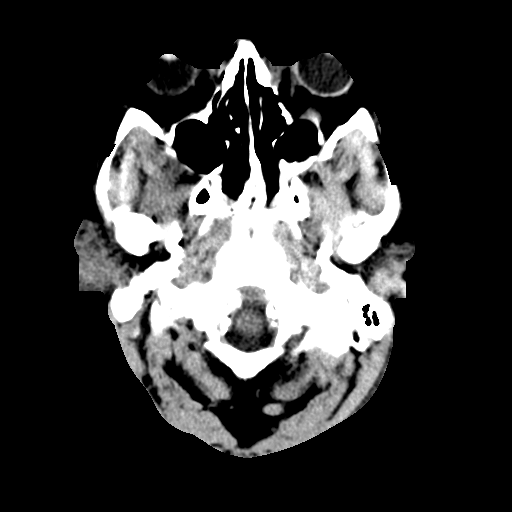
[im 3/31  bone]
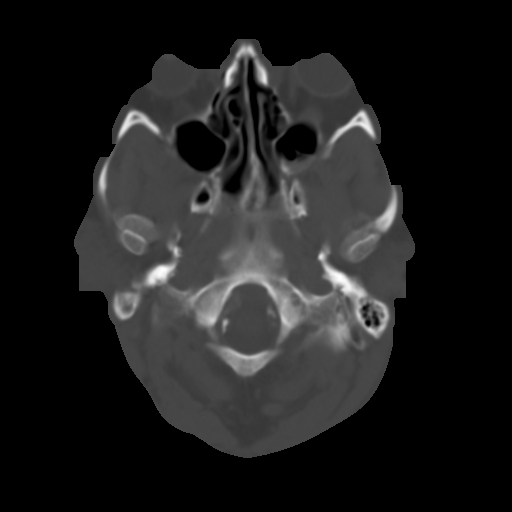
[im 7/31  brain]
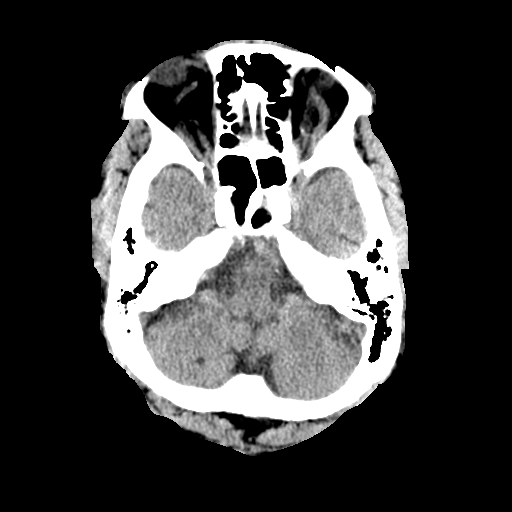
[im 9/31  brain]
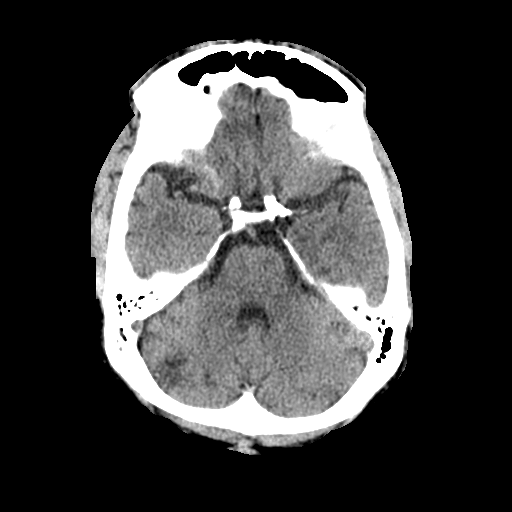
[im 13/31  brain]
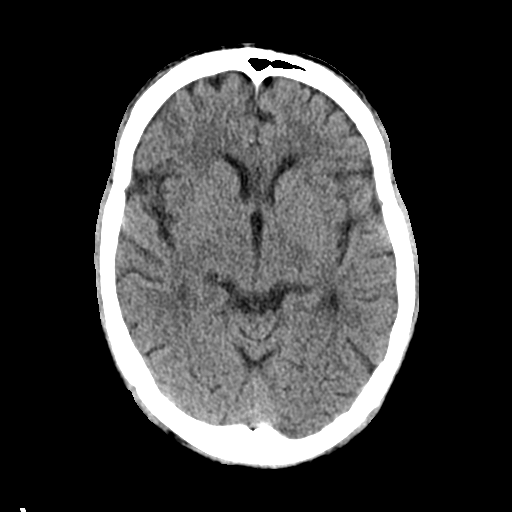
[im 16/31  brain]
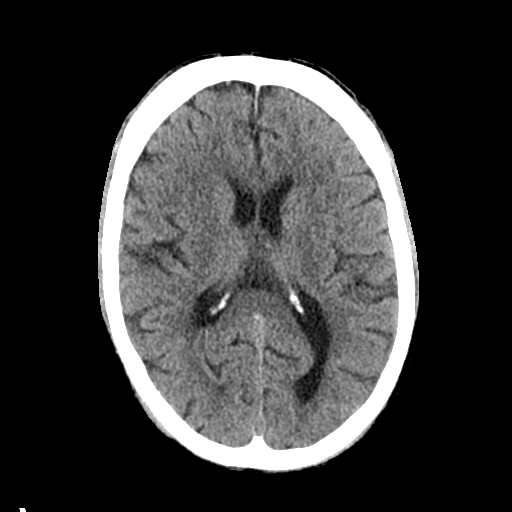
[im 16/31  bone]
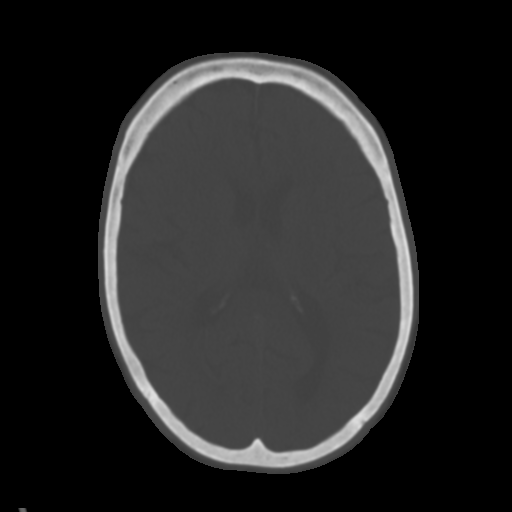
[im 18/31  brain]
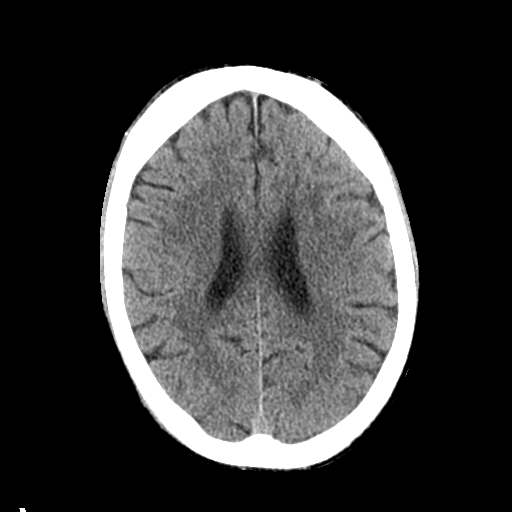
[im 22/31  brain]
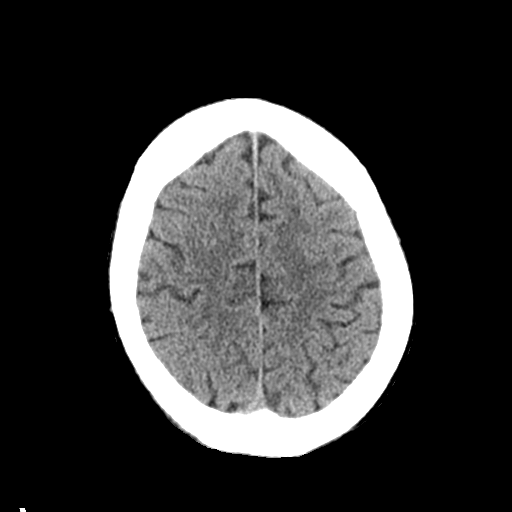
[im 24/31  brain]
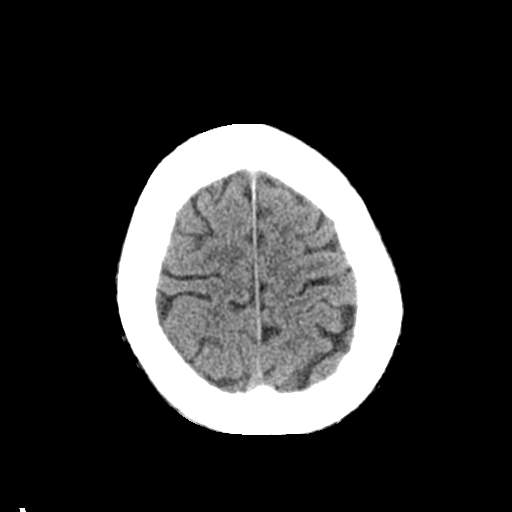
[im 28/31  brain]
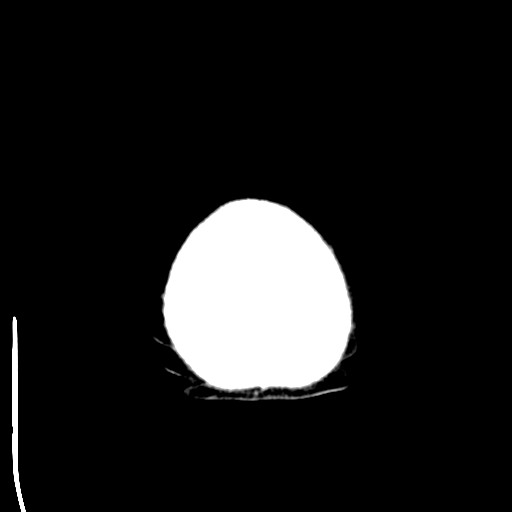
[im 28/31  bone]
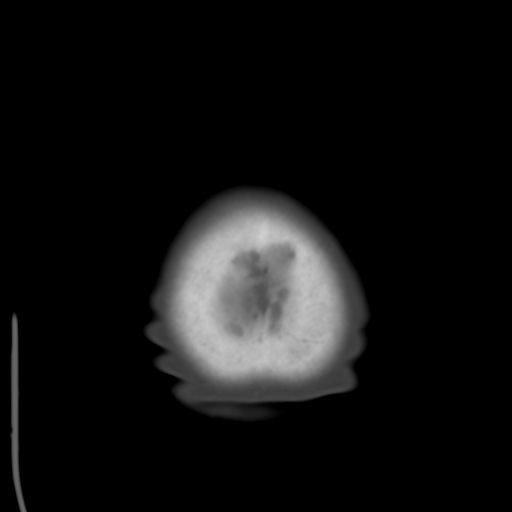

[Series 6: coronal soft tissue · coronal · 0.33mm/px · 3 of 68 slices shown]
[im 23/68  brain]
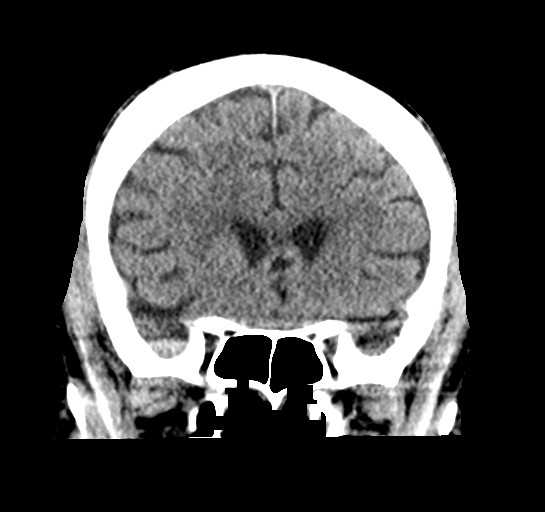
[im 30/68  brain]
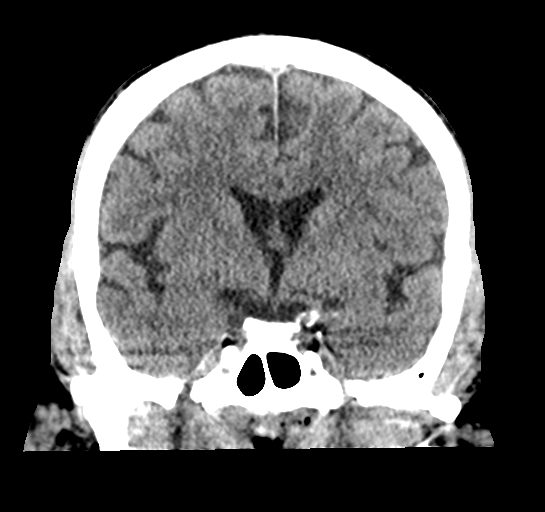
[im 38/68  brain]
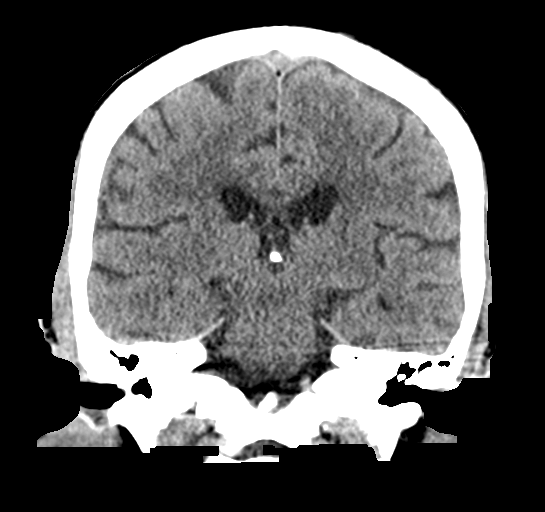

[Series 7: sagittal soft tissue · sagittal · 0.33mm/px · 3 of 54 slices shown]
[im 18/54  brain]
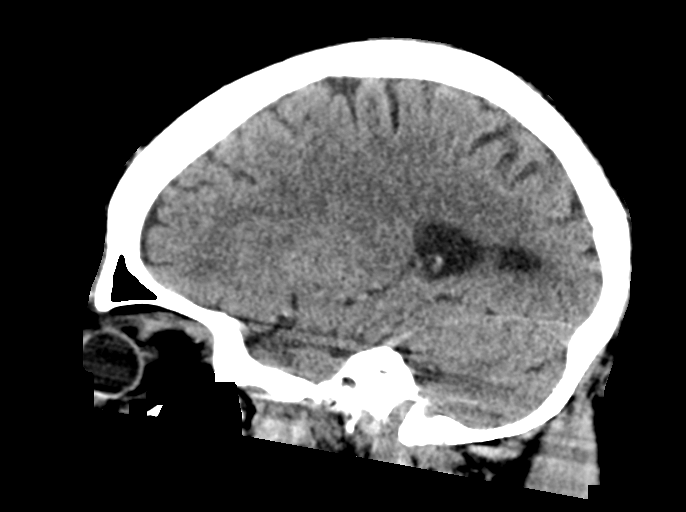
[im 27/54  brain]
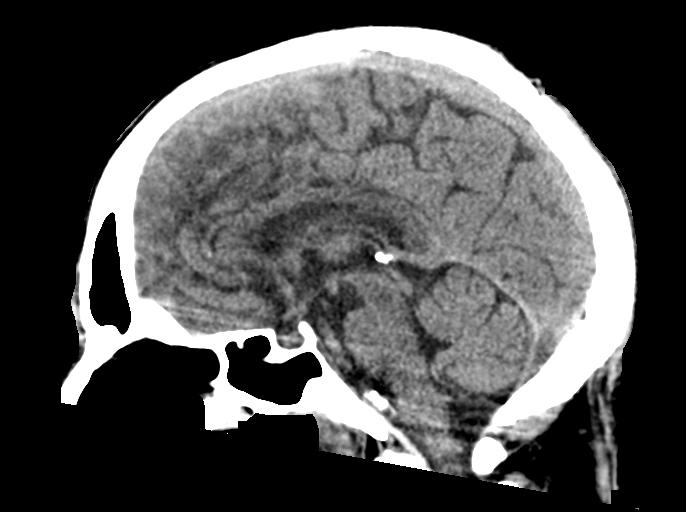
[im 36/54  brain]
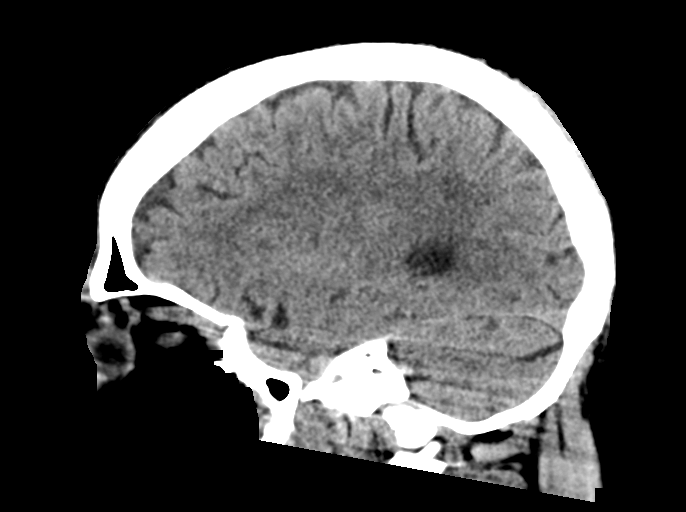

[15 of 47 positions shown; findings below may reference images not displayed]

FINDINGS: Brain: No evidence of acute infarction, hemorrhage, hydrocephalus,
extra-axial collection or mass lesion/mass effect.

Patchy areas of white matter hypoattenuation are noted consistent
with mild chronic microvascular ischemic change, stable. Prominent
sulcus, with possible small old infarct, right cerebellum, also
stable.

Vascular: No hyperdense vessel. Advanced carotid vascular
calcification for age, extending into the middle cerebral arteries.

Skull: Normal. Negative for fracture or focal lesion.

Sinuses/Orbits: Globes and orbits are unremarkable. Dependent
secretions in the visualized left maxillary sinus. Remaining sinuses
are clear.

Other: None.
IMPRESSION: 1. No acute intracranial abnormalities.
2. Mild chronic microvascular ischemic change. Possible small old
right cerebellar infarct.

## 2021-03-11 MED ORDER — ALPRAZOLAM 0.25 MG PO TABS
0.2500 mg | ORAL_TABLET | Freq: Two times a day (BID) | ORAL | Status: DC | PRN
  Administered 2021-03-11: 0.25 mg via ORAL
  Filled 2021-03-11: qty 1

## 2021-03-11 MED ORDER — THIAMINE HCL 100 MG PO TABS: 100.0000 mg | ORAL_TABLET | Freq: Every day | ORAL | Status: DC

## 2021-03-11 MED ORDER — CALCIUM ACETATE (PHOS BINDER) 667 MG PO CAPS: 1334.0000 mg | ORAL_CAPSULE | Freq: Three times a day (TID) | ORAL | 2 refills | Status: DC

## 2021-03-11 NOTE — ED Notes (Signed)
Pt taken to dialysis by transport team.

## 2021-03-11 NOTE — Discharge Summary (Signed)
Physician Discharge Summary  Jordan [name] [id number] DOB: 1966/01/09 DOA: 03/10/2021  PCP: Pcp, No  Admit date: 03/10/2021 Discharge date: 03/11/2021  Admitted From: Home Disposition: Home  Recommendations for Outpatient Follow-up:  Follow up with PCP in 1-2 weeks Follow-up for scheduled dialysis on Monday, Wednesday and Friday. Please obtain BMP/CBC in one week Please follow up on the following pending results:  Home Health: No Equipment/Devices: None Discharge Condition: Stable CODE STATUS: Full Diet recommendation: Heart Healthy / Carb Modified   Brief/Interim Summary: Jordan [name] is a 55 y.o. male with medical history significant for End-stage renal disease on hemodialysis MWF, combined heart failure, hypertension, paroxysmal atrial fibrillation, COPD, noncompliance with dialysis medication, presents to the emergency department for chief concerns of shortness of breath.  Patient appears volume overload.  Per patient he just missed his dialysis on Friday as he was taken to jail for driving without a license on Thursday.  On care everywhere his last dialysis was noted on 02/22/2021.  Electrolytes within normal limit.  He was taken for dialysis by nephrology.  Saturating well on room air.  Per nephrology he can resume his schedule and there is no need for more subsequent dialysis.  Patient will continue the rest of his home medications and resume his scheduled dialysis on Monday, Wednesday and Friday.  He was advised to follow-up with his providers.  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 Please continue with your current medications and go for your dialysis regularly. Please do not miss any session and follow-up with your nephrologist closely.   Increase activity slowly   Complete by: As directed       Allergies as of 03/11/2021       Reactions   Lisinopril Swelling        Medication List     TAKE these medications    ALPRAZolam 0.25 MG tablet Commonly known as: XANAX Take 0.25 mg by mouth 2 (two) times daily as needed.   Auryxia 1 GM 210 MG(Fe) tablet Generic drug: ferric citrate Take 420 mg by mouth every Monday, Wednesday, and Friday with hemodialysis.   calcium acetate 667 MG capsule Commonly known as: PHOSLO Take 2 capsules (1,334 mg total) by mouth 3 (three) times daily with meals.   cinacalcet 90 MG tablet Commonly known as: SENSIPAR Take 1 tablet by mouth every evening.   epoetin alfa 4000 UNIT/ML injection Commonly known as: EPOGEN Inject 1 mL (4,000 Units total) into the vein every Monday, Wednesday, and Friday with hemodialysis.   hydrALAZINE 50 MG tablet Commonly known as: APRESOLINE Take 1 tablet (50 mg total) by mouth 3 (three) times daily as needed.   hydrOXYzine 50 MG capsule Commonly known as: Vistaril Take 1 capsule (50 mg total) by mouth 3 (three) times daily as needed.   Lidocaine 3 % Crea Apply 1 application topically 2 (two) times daily as needed (pain).   sevelamer carbonate 800 MG tablet Commonly known as: RENVELA Take 800 mg by mouth 3 (three) times daily with  meals.   thiamine 100 MG tablet Take 1 tablet (100 mg total) by mouth daily.        Allergies  Allergen Reactions   Lisinopril Swelling    Consultations: Nephrology  Procedures/Studies: DG Chest 2 View  Result Date: 03/10/2021 CLINICAL DATA:  Shortness of breath EXAM: CHEST - 2 VIEW COMPARISON:  03/01/2021 FINDINGS: Cardiac shadow remains enlarged. Aortic calcifications are again seen. Vascular  congestion is again noted and stable. Interstitial changes are noted unchanged from the prior exam without focal infiltrate or sizable effusion. No bony abnormality is noted. IMPRESSION: Stable interstitial changes of a chronic nature. No acute abnormality is noted. Electronically Signed   By: Inez Catalina M.D.   On: 03/10/2021 22:02   DG Chest 2 View  Result Date: 03/01/2021 CLINICAL DATA:  Shortness of breath.  Dialysis patient. EXAM: CHEST - 2 VIEW COMPARISON:  February 27, 2019 FINDINGS: Stable cardiomegaly. The hila and mediastinum are unchanged. Increased interstitial markings in the lungs are similar since previous studies. No other abnormalities. IMPRESSION: Stable cardiomegaly. Stable increased interstitial markings in the lungs are chronic without overt acute edema or infiltrate. Electronically Signed   By: Dorise Bullion III M.D   On: 03/01/2021 21:50   DG Chest 2 View  Result Date: 02/26/2021 CLINICAL DATA:  Rash and shortness of breath. EXAM: CHEST - 2 VIEW COMPARISON:  Feb 01, 2021 FINDINGS: Mild, predominantly stable diffusely increased interstitial lung markings are seen. There is no evidence of a pleural effusion or pneumothorax. The cardiac silhouette is enlarged. Moderate severity calcification of the aortic arch is noted. The visualized skeletal structures are unremarkable. IMPRESSION: Stable exam without active cardiopulmonary disease. Electronically Signed   By: Virgina Norfolk M.D.   On: 02/26/2021 16:55   CT Angio Chest PE W and/or Wo Contrast  Result Date: 02/26/2021 CLINICAL DATA:  Shortness of breath and leg swelling. Dialysis patient. EXAM: CT ANGIOGRAPHY CHEST WITH CONTRAST TECHNIQUE: Multidetector CT imaging of the chest was performed using the standard protocol during bolus administration of intravenous contrast. Multiplanar CT image reconstructions and MIPs were obtained to evaluate the vascular anatomy. CONTRAST:  68m OMNIPAQUE IOHEXOL 350 MG/ML SOLN COMPARISON:  Chest  radiographs obtained earlier today. Chest CTA dated 11/12/2019 FINDINGS: Cardiovascular: Stable enlarged heart. Interval small pericardial effusion with a maximum thickness of 7 mm. Atheromatous calcifications, including the coronary arteries and aorta. Normally opacified pulmonary arteries with no pulmonary arterial filling defects seen. Mediastinum/Nodes: No enlarged mediastinal, hilar, or axillary lymph nodes. Thyroid gland, trachea, and esophagus demonstrate no significant findings. Lungs/Pleura: Stable diffuse prominence of the interstitial markings with mild centrilobular bullous changes, most pronounced in the right upper lobe. No interval airspace consolidation or pleural fluid. Upper Abdomen: Interval small to moderate amount of free peritoneal fluid. Musculoskeletal: Diffuse bone sclerosis without significant change. Thoracic and lower cervical spine degenerative changes. Review of the MIP images confirms the above findings. IMPRESSION: 1. No pulmonary emboli. 2. Interval small pericardial effusion. 3. Interval small to moderate amount of ascites. 4.  Calcific coronary artery and aortic atherosclerosis. 5. 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 6. 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 Aortic Atherosclerosis (ICD10-I70.0) and Emphysema (ICD10-J43.9). Electronically Signed   By: SClaudie ReveringM.D.   On: 02/26/2021 21:26   ECHOCARDIOGRAM COMPLETE  Result Date: 03/02/2021    ECHOCARDIOGRAM REPORT   Patient Name:   CSHAYE CHUSTZDate of Exam: 03/02/2021 Medical Rec #:  [id number]  Height:       64.0 in Accession #:    [id number]       Weight:       156.0 lb Date of Birth:  02-15-1966        BSA:          1.760 m Patient Age:    23 years         BP:           157/105 mmHg Patient Gender: M                HR:           91 bpm. Exam Location:  [facility] Procedure: 2D Echo Indications:     CHF I50.21  History:         Patient has prior  history of Echocardiogram examinations, most                  recent 04/05/2020.  Sonographer:     Kathlen Brunswick RDCS Referring Phys:  [id number] Athena Masse Diagnosing Phys: Kate Sable MD IMPRESSIONS  1. Left ventricular ejection fraction, by estimation, is 50%. The left ventricle has low normal function. The left ventricle has no regional wall motion abnormalities. There is mild left ventricular hypertrophy. Left ventricular diastolic parameters are  consistent with Grade II diastolic dysfunction (pseudonormalization).  2. Right ventricular systolic function is mildly reduced. The right ventricular size is mildly enlarged. There is severely elevated pulmonary artery systolic pressure. The estimated right ventricular systolic pressure is 123456 mmHg.  3. Left atrial size was mildly dilated.  4. Right atrial size was mildly dilated.  5. The mitral valve is degenerative. Mild mitral valve regurgitation.  6. Tricuspid valve regurgitation is moderate.  7. The aortic valve is tricuspid. Aortic valve regurgitation is not visualized. Mild aortic valve sclerosis is present, with no evidence of aortic valve stenosis.  8. The inferior vena cava is dilated in size with <50% respiratory variability, suggesting right atrial pressure of 15 mmHg. FINDINGS  Left Ventricle: Left ventricular ejection fraction, by estimation, is 50%. The left ventricle has low normal function. The left ventricle has no regional wall motion abnormalities. The left ventricular internal cavity size was normal in size. There is mild left ventricular hypertrophy. Left ventricular diastolic parameters are consistent with Grade II diastolic dysfunction (pseudonormalization). Right Ventricle: The right ventricular size is mildly enlarged. No increase in right ventricular wall thickness. Right ventricular systolic function is mildly reduced. There is severely elevated pulmonary artery systolic pressure. The tricuspid regurgitant velocity is 3.68 m/s,  and with an assumed right atrial pressure of 15 mmHg, the estimated right ventricular systolic pressure is 123456 mmHg. Left Atrium: Left atrial size was mildly dilated. Right Atrium: Right atrial size was mildly dilated. Pericardium: There is no evidence of pericardial effusion. Mitral Valve: The mitral valve is degenerative in appearance. Mild mitral valve regurgitation. Tricuspid Valve: The tricuspid valve is normal in structure. Tricuspid valve regurgitation is moderate. Aortic Valve: The aortic valve is tricuspid. Aortic valve regurgitation is not visualized. Mild aortic valve sclerosis is present, with no evidence of aortic valve stenosis. Aortic valve peak gradient measures 13.0 mmHg. Pulmonic Valve: The pulmonic valve was normal in structure. Pulmonic valve regurgitation is not visualized. Aorta: The aortic root is normal in size and structure. Venous: The inferior vena cava is dilated in size with less than 50% respiratory variability, suggesting right atrial pressure of 15 mmHg. IAS/Shunts: No atrial level shunt detected by color flow  Doppler.  LEFT VENTRICLE PLAX 2D LVIDd:         4.97 cm  Diastology LVIDs:         3.51 cm  LV e' medial:    6.53 cm/s LV PW:         1.29 cm  LV E/e' medial:  23.3 LV IVS:        1.15 cm  LV e' lateral:   9.46 cm/s LVOT diam:     1.90 cm  LV E/e' lateral: 16.1 LV SV:         52 LV SV Index:   30 LVOT Area:     2.84 cm  RIGHT VENTRICLE RV Basal diam:  4.32 cm RV S prime:     12.00 cm/s TAPSE (M-mode): 1.4 cm LEFT ATRIUM             Index       RIGHT ATRIUM           Index LA diam:        5.20 cm 2.95 cm/m  RA Area:     19.80 cm LA Vol (A2C):   92.4 ml 52.49 ml/m RA Volume:   61.00 ml  34.65 ml/m LA Vol (A4C):   55.6 ml 31.59 ml/m LA Biplane Vol: 71.8 ml 40.79 ml/m  AORTIC VALVE                PULMONIC VALVE AV Area (Vmax): 1.64 cm    PV Vmax:       1.10 m/s AV Vmax:        180.00 cm/s PV Peak grad:  4.8 mmHg AV Peak Grad:   13.0 mmHg LVOT Vmax:      104.00 cm/s LVOT  Vmean:     70.000 cm/s LVOT VTI:       0.184 m  AORTA Ao Root diam: 2.90 cm Ao Asc diam:  2.80 cm MITRAL VALVE                TRICUSPID VALVE MV Area (PHT): 6.12 cm     TV Peak grad:   47.8 mmHg MV Decel Time: 124 msec     TV Vmax:        3.46 m/s MV E velocity: 152.00 cm/s  TR Peak grad:   54.2 mmHg MV A velocity: 64.90 cm/s   TR Vmax:        368.00 cm/s MV E/A ratio:  2.34                             SHUNTS                             Systemic VTI:  0.18 m                             Systemic Diam: 1.90 cm Kate Sable MD Electronically signed by Kate Sable MD Signature Date/Time: 03/02/2021/2:54:39 PM    Final     Subjective: Patient was seen and examined today.  He was eating breakfast and appears very comfortable, on room air.  Patient was concerned about her volume overload stating that he just missed 1 dialysis but on care everywhere his last dialysis was on June/3/22. He was counseled to stay compliant with his schedule.  Discharge Exam: Vitals:   03/11/21 1315 03/11/21 1330  BP:  134/83 (!) 146/109  Pulse: 99 (!) 101  Resp: 18 18  Temp:    SpO2:     Vitals:   03/11/21 1245 03/11/21 1300 03/11/21 1315 03/11/21 1330  BP: (!) 144/95 (!) 144/93 134/83 (!) 146/109  Pulse: (!) 102 (!) 104 99 (!) 101  Resp: '19 19 18 18  '$ Temp:      TempSrc:      SpO2:      Weight:      Height:        General: Pt is alert, awake, not in acute distress, significant periorbital edema. Cardiovascular: RRR, S1/S2 +, no rubs, no gallops Respiratory: CTA bilaterally, no wheezing, no rhonchi Abdominal: Soft, NT, ND, bowel sounds + Extremities: 2+ LE edema.  The results of significant diagnostics from this hospitalization (including imaging, microbiology, ancillary and laboratory) are listed below for reference.    Microbiology: Recent Results (from the past 240 hour(s))  Resp Panel by RT-PCR (Flu A&B, Covid) Nasopharyngeal Swab     Status: None   Collection Time: 03/02/21  4:16 AM    Specimen: Nasopharyngeal Swab; Nasopharyngeal(NP) swabs in vial transport medium  Result Value Ref Range Status   SARS Coronavirus 2 by RT PCR NEGATIVE NEGATIVE Final    Comment: (NOTE) SARS-CoV-2 target nucleic acids are NOT DETECTED.  The SARS-CoV-2 RNA is generally detectable in upper respiratory specimens during the acute phase of infection. The lowest concentration of SARS-CoV-2 viral copies this assay can detect is 138 copies/mL. A negative result does not preclude SARS-Cov-2 infection and should not be used as the sole basis for treatment or other patient management decisions. A negative result may occur with  improper specimen collection/handling, submission of specimen other than nasopharyngeal swab, presence of viral mutation(s) within the areas targeted by this assay, and inadequate number of viral copies(<138 copies/mL). A negative result must be combined with clinical observations, patient history, and epidemiological information. The expected result is Negative.  Fact Sheet for Patients:  EntrepreneurPulse.com.au  Fact Sheet for Healthcare Providers:  IncredibleEmployment.be  This test is no t yet approved or cleared by the [place] FDA and  has been authorized for detection and/or diagnosis of SARS-CoV-2 by FDA under an Emergency Use Authorization (EUA). This EUA will remain  in effect (meaning this test can be used) for the duration of the COVID-19 declaration under Section 564(b)(1) of the Act, 21 U.S.C.section 360bbb-3(b)(1), unless the authorization is terminated  or revoked sooner.       Influenza A by PCR NEGATIVE NEGATIVE Final   Influenza B by PCR NEGATIVE NEGATIVE Final    Comment: (NOTE) The Xpert Xpress SARS-CoV-2/FLU/RSV plus assay is intended as an aid in the diagnosis of influenza from Nasopharyngeal swab specimens and should not be used as a sole basis for treatment. Nasal washings and aspirates are  unacceptable for Xpert Xpress SARS-CoV-2/FLU/RSV testing.  Fact Sheet for Patients: EntrepreneurPulse.com.au  Fact Sheet for Healthcare Providers: IncredibleEmployment.be  This test is not yet approved or cleared by the [place] FDA and has been authorized for detection and/or diagnosis of SARS-CoV-2 by FDA under an Emergency Use Authorization (EUA). This EUA will remain in effect (meaning this test can be used) for the duration of the COVID-19 declaration under Section 564(b)(1) of the Act, 21 U.S.C. section 360bbb-3(b)(1), unless the authorization is terminated or revoked.  Performed at [facility], [address] [place], [place] [address]   Resp Panel by RT-PCR (Flu A&B, Covid) Nasopharyngeal Swab     Status:  None   Collection Time: 03/10/21 10:27 PM   Specimen: Nasopharyngeal Swab; Nasopharyngeal(NP) swabs in vial transport medium  Result Value Ref Range Status   SARS Coronavirus 2 by RT PCR NEGATIVE NEGATIVE Final    Comment: (NOTE) SARS-CoV-2 target nucleic acids are NOT DETECTED.  The SARS-CoV-2 RNA is generally detectable in upper respiratory specimens during the acute phase of infection. The lowest concentration of SARS-CoV-2 viral copies this assay can detect is 138 copies/mL. A negative result does not preclude SARS-Cov-2 infection and should not be used as the sole basis for treatment or other patient management decisions. A negative result may occur with  improper specimen collection/handling, submission of specimen other than nasopharyngeal swab, presence of viral mutation(s) within the areas targeted by this assay, and inadequate number of viral copies(<138 copies/mL). A negative result must be combined with clinical observations, patient history, and epidemiological information. The expected result is Negative.  Fact Sheet for Patients:  EntrepreneurPulse.com.au  Fact Sheet for Healthcare  Providers:  IncredibleEmployment.be  This test is no t yet approved or cleared by the [place] FDA and  has been authorized for detection and/or diagnosis of SARS-CoV-2 by FDA under an Emergency Use Authorization (EUA). This EUA will remain  in effect (meaning this test can be used) for the duration of the COVID-19 declaration under Section 564(b)(1) of the Act, 21 U.S.C.section 360bbb-3(b)(1), unless the authorization is terminated  or revoked sooner.       Influenza A by PCR NEGATIVE NEGATIVE Final   Influenza B by PCR NEGATIVE NEGATIVE Final    Comment: (NOTE) The Xpert Xpress SARS-CoV-2/FLU/RSV plus assay is intended as an aid in the diagnosis of influenza from Nasopharyngeal swab specimens and should not be used as a sole basis for treatment. Nasal washings and aspirates are unacceptable for Xpert Xpress SARS-CoV-2/FLU/RSV testing.  Fact Sheet for Patients: EntrepreneurPulse.com.au  Fact Sheet for Healthcare Providers: IncredibleEmployment.be  This test is not yet approved or cleared by the [place] FDA and has been authorized for detection and/or diagnosis of SARS-CoV-2 by FDA under an Emergency Use Authorization (EUA). This EUA will remain in effect (meaning this test can be used) for the duration of the COVID-19 declaration under Section 564(b)(1) of the Act, 21 U.S.C. section 360bbb-3(b)(1), unless the authorization is terminated or revoked.  Performed at [facility], [address] [place], [place] [address]      Labs: BNP (last 3 results) Recent Labs    03/21/20 0310 02/26/21 1700 03/10/21 2128  BNP 3,603.5* 2,730.3* [id number]*   Basic Metabolic Panel: Recent Labs  Lab 03/10/21 2128 03/11/21 0458 03/11/21 0942  NA 145 145  --   K 5.0 4.9  --   CL 102 102  --   CO2 23 24  --   GLUCOSE 104* 124*  --   BUN 97* 94*  --   CREATININE 17.84* 17.97*  --   CALCIUM 9.7 9.4  --    PHOS  --   --  10.0*   Liver Function Tests: No results for input(s): AST, ALT, ALKPHOS, BILITOT, PROT, ALBUMIN in the last 168 hours. No results for input(s): LIPASE, AMYLASE in the last 168 hours. No results for input(s): AMMONIA in the last 168 hours. CBC: Recent Labs  Lab 03/10/21 2128 03/11/21 0458  WBC 7.4 8.4  HGB 7.5* 7.5*  HCT 23.3* 23.9*  MCV 101.7* 100.4*  PLT 128* 139*   Cardiac Enzymes: No results for input(s): CKTOTAL, CKMB, CKMBINDEX, TROPONINI in the last  168 hours. BNP: Invalid input(s): POCBNP CBG: No results for input(s): GLUCAP in the last 168 hours. D-Dimer No results for input(s): DDIMER in the last 72 hours. Hgb A1c No results for input(s): HGBA1C in the last 72 hours. Lipid Profile No results for input(s): CHOL, HDL, LDLCALC, TRIG, CHOLHDL, LDLDIRECT in the last 72 hours. Thyroid function studies No results for input(s): TSH, T4TOTAL, T3FREE, THYROIDAB in the last 72 hours.  Invalid input(s): FREET3 Anemia work up No results for input(s): VITAMINB12, FOLATE, FERRITIN, TIBC, IRON, RETICCTPCT in the last 72 hours. Urinalysis    Component Value Date/Time   COLORURINE YELLOW (A) 01/13/2021 0153   APPEARANCEUR CLEAR (A) 01/13/2021 0153   LABSPEC 1.012 01/13/2021 0153   PHURINE 9.0 (H) 01/13/2021 0153   GLUCOSEU 150 (A) 01/13/2021 0153   HGBUR NEGATIVE 01/13/2021 0153   BILIRUBINUR NEGATIVE 01/13/2021 0153   KETONESUR NEGATIVE 01/13/2021 0153   PROTEINUR >=300 (A) 01/13/2021 0153   NITRITE NEGATIVE 01/13/2021 0153   LEUKOCYTESUR NEGATIVE 01/13/2021 0153   Sepsis Labs Invalid input(s): PROCALCITONIN,  WBC,  LACTICIDVEN Microbiology Recent Results (from the past 240 hour(s))  Resp Panel by RT-PCR (Flu A&B, Covid) Nasopharyngeal Swab     Status: None   Collection Time: 03/02/21  4:16 AM   Specimen: Nasopharyngeal Swab; Nasopharyngeal(NP) swabs in vial transport medium  Result Value Ref Range Status   SARS Coronavirus 2 by RT PCR NEGATIVE  NEGATIVE Final    Comment: (NOTE) SARS-CoV-2 target nucleic acids are NOT DETECTED.  The SARS-CoV-2 RNA is generally detectable in upper respiratory specimens during the acute phase of infection. The lowest concentration of SARS-CoV-2 viral copies this assay can detect is 138 copies/mL. A negative result does not preclude SARS-Cov-2 infection and should not be used as the sole basis for treatment or other patient management decisions. A negative result may occur with  improper specimen collection/handling, submission of specimen other than nasopharyngeal swab, presence of viral mutation(s) within the areas targeted by this assay, and inadequate number of viral copies(<138 copies/mL). A negative result must be combined with clinical observations, patient history, and epidemiological information. The expected result is Negative.  Fact Sheet for Patients:  EntrepreneurPulse.com.au  Fact Sheet for Healthcare Providers:  IncredibleEmployment.be  This test is no t yet approved or cleared by the [place] FDA and  has been authorized for detection and/or diagnosis of SARS-CoV-2 by FDA under an Emergency Use Authorization (EUA). This EUA will remain  in effect (meaning this test can be used) for the duration of the COVID-19 declaration under Section 564(b)(1) of the Act, 21 U.S.C.section 360bbb-3(b)(1), unless the authorization is terminated  or revoked sooner.       Influenza A by PCR NEGATIVE NEGATIVE Final   Influenza B by PCR NEGATIVE NEGATIVE Final    Comment: (NOTE) The Xpert Xpress SARS-CoV-2/FLU/RSV plus assay is intended as an aid in the diagnosis of influenza from Nasopharyngeal swab specimens and should not be used as a sole basis for treatment. Nasal washings and aspirates are unacceptable for Xpert Xpress SARS-CoV-2/FLU/RSV testing.  Fact Sheet for Patients: EntrepreneurPulse.com.au  Fact Sheet for Healthcare  Providers: IncredibleEmployment.be  This test is not yet approved or cleared by the [place] FDA and has been authorized for detection and/or diagnosis of SARS-CoV-2 by FDA under an Emergency Use Authorization (EUA). This EUA will remain in effect (meaning this test can be used) for the duration of the COVID-19 declaration under Section 564(b)(1) of the Act, 21 U.S.C. section 360bbb-3(b)(1), unless the authorization  is terminated or revoked.  Performed at [facility], [place], [place] [address]   Resp Panel by RT-PCR (Flu A&B, Covid) Nasopharyngeal Swab     Status: None   Collection Time: 03/10/21 10:27 PM   Specimen: Nasopharyngeal Swab; Nasopharyngeal(NP) swabs in vial transport medium  Result Value Ref Range Status   SARS Coronavirus 2 by RT PCR NEGATIVE NEGATIVE Final    Comment: (NOTE) SARS-CoV-2 target nucleic acids are NOT DETECTED.  The SARS-CoV-2 RNA is generally detectable in upper respiratory specimens during the acute phase of infection. The lowest concentration of SARS-CoV-2 viral copies this assay can detect is 138 copies/mL. A negative result does not preclude SARS-Cov-2 infection and should not be used as the sole basis for treatment or other patient management decisions. A negative result may occur with  improper specimen collection/handling, submission of specimen other than nasopharyngeal swab, presence of viral mutation(s) within the areas targeted by this assay, and inadequate number of viral copies(<138 copies/mL). A negative result must be combined with clinical observations, patient history, and epidemiological information. The expected result is Negative.  Fact Sheet for Patients:  EntrepreneurPulse.com.au  Fact Sheet for Healthcare Providers:  IncredibleEmployment.be  This test is no t yet approved or cleared by the [place] FDA and  has been authorized for  detection and/or diagnosis of SARS-CoV-2 by FDA under an Emergency Use Authorization (EUA). This EUA will remain  in effect (meaning this test can be used) for the duration of the COVID-19 declaration under Section 564(b)(1) of the Act, 21 U.S.C.section 360bbb-3(b)(1), unless the authorization is terminated  or revoked sooner.       Influenza A by PCR NEGATIVE NEGATIVE Final   Influenza B by PCR NEGATIVE NEGATIVE Final    Comment: (NOTE) The Xpert Xpress SARS-CoV-2/FLU/RSV plus assay is intended as an aid in the diagnosis of influenza from Nasopharyngeal swab specimens and should not be used as a sole basis for treatment. Nasal washings and aspirates are unacceptable for Xpert Xpress SARS-CoV-2/FLU/RSV testing.  Fact Sheet for Patients: EntrepreneurPulse.com.au  Fact Sheet for Healthcare Providers: IncredibleEmployment.be  This test is not yet approved or cleared by the [place] FDA and has been authorized for detection and/or diagnosis of SARS-CoV-2 by FDA under an Emergency Use Authorization (EUA). This EUA will remain in effect (meaning this test can be used) for the duration of the COVID-19 declaration under Section 564(b)(1) of the Act, 21 U.S.C. section 360bbb-3(b)(1), unless the authorization is terminated or revoked.  Performed at [facility], [address] [place], [place] [address]     Time coordinating discharge: Over 30 minutes  SIGNED:  Lorella Nimrod, MD  [facility] 03/11/2021, 4:07 PM  If 7PM-7AM, please contact night-coverage [email or web address]  This record has been created using Systems analyst. Errors have been sought and corrected,but may not always be located. Such creation errors do not reflect on the standard of care.

## 2021-03-11 NOTE — ED Notes (Signed)
Pt eating breakfast sitting on side of bed. Pt had taken off all monitoring leads. Reminded pt to keep wires BP cuff etc on.

## 2021-03-11 NOTE — Progress Notes (Signed)
[facility]  ROUNDING NOTE   Subjective:   Mr. BRAYDEN MANJARRES was admitted to [facility] on 03/10/2021 for Shortness of breath [R06.02]  Last hemodialysis treatment was reported to be June 15, Wednesday by patient. However Patient's last outpatient dialysis seems to have been June 3 according to Care Everywhere.   Patient was found to be shortness of breath. Patient states that was taken to [facility] on Friday due to driving without a driver's license.    Objective:  Vital signs in last 24 hours:  Temp:  [98.1 F (36.7 C)-98.2 F (36.8 C)] 98.2 F (36.8 C) (06/20 0438) Pulse Rate:  [97-108] 97 (06/20 0438) Resp:  [20-24] 20 (06/20 0438) BP: (128-147)/(91-106) 128/91 (06/20 0438) SpO2:  [95 %-100 %] 100 % (06/20 0438) Weight:  [71.2 kg] 71.2 kg (06/19 2119)  Weight change:  Filed Weights   03/10/21 2119  Weight: 71.2 kg    Intake/Output: No intake/output data recorded.   Intake/Output this shift:  No intake/output data recorded.  Physical Exam: General: NAD, laying in bed  Head: +facial edema  Eyes: Anicteric, PERRL  Neck: Supple, trachea midline  Lungs:  Bilateral crackles  Heart: Regular rate and rhythm  Abdomen:  Soft, nontender,   Extremities:  +peripheral edema.  Neurologic: Nonfocal, moving all four extremities  Skin: No lesions  Access: Left AVF    Basic Metabolic Panel: Recent Labs  Lab 03/10/21 2128 03/11/21 0458  NA 145 145  K 5.0 4.9  CL 102 102  CO2 23 24  GLUCOSE 104* 124*  BUN 97* 94*  CREATININE 17.84* 17.97*  CALCIUM 9.7 9.4    Liver Function Tests: No results for input(s): AST, ALT, ALKPHOS, BILITOT, PROT, ALBUMIN in the last 168 hours. No results for input(s): LIPASE, AMYLASE in the last 168 hours. No results for input(s): AMMONIA in the last 168 hours.  CBC: Recent Labs  Lab 03/10/21 2128 03/11/21 0458  WBC 7.4 8.4  HGB 7.5* 7.5*  HCT 23.3* 23.9*  MCV 101.7* 100.4*  PLT 128* 139*    Cardiac Enzymes: No results  for input(s): CKTOTAL, CKMB, CKMBINDEX, TROPONINI in the last 168 hours.  BNP: Invalid input(s): POCBNP  CBG: No results for input(s): GLUCAP in the last 168 hours.  Microbiology: Results for orders placed or performed during the hospital encounter of 03/10/21  Resp Panel by RT-PCR (Flu A&B, Covid) Nasopharyngeal Swab     Status: None   Collection Time: 03/10/21 10:27 PM   Specimen: Nasopharyngeal Swab; Nasopharyngeal(NP) swabs in vial transport medium  Result Value Ref Range Status   SARS Coronavirus 2 by RT PCR NEGATIVE NEGATIVE Final    Comment: (NOTE) SARS-CoV-2 target nucleic acids are NOT DETECTED.  The SARS-CoV-2 RNA is generally detectable in upper respiratory specimens during the acute phase of infection. The lowest concentration of SARS-CoV-2 viral copies this assay can detect is 138 copies/mL. A negative result does not preclude SARS-Cov-2 infection and should not be used as the sole basis for treatment or other patient management decisions. A negative result may occur with  improper specimen collection/handling, submission of specimen other than nasopharyngeal swab, presence of viral mutation(s) within the areas targeted by this assay, and inadequate number of viral copies(<138 copies/mL). A negative result must be combined with clinical observations, patient history, and epidemiological information. The expected result is Negative.  Fact Sheet for Patients:  EntrepreneurPulse.com.au  Fact Sheet for Healthcare Providers:  IncredibleEmployment.be  This test is no t yet approved or cleared by the [place]  States FDA and  has been authorized for detection and/or diagnosis of SARS-CoV-2 by FDA under an Emergency Use Authorization (EUA). This EUA will remain  in effect (meaning this test can be used) for the duration of the COVID-19 declaration under Section 564(b)(1) of the Act, 21 U.S.C.section 360bbb-3(b)(1), unless the  authorization is terminated  or revoked sooner.       Influenza A by PCR NEGATIVE NEGATIVE Final   Influenza B by PCR NEGATIVE NEGATIVE Final    Comment: (NOTE) The Xpert Xpress SARS-CoV-2/FLU/RSV plus assay is intended as an aid in the diagnosis of influenza from Nasopharyngeal swab specimens and should not be used as a sole basis for treatment. Nasal washings and aspirates are unacceptable for Xpert Xpress SARS-CoV-2/FLU/RSV testing.  Fact Sheet for Patients: EntrepreneurPulse.com.au  Fact Sheet for Healthcare Providers: IncredibleEmployment.be  This test is not yet approved or cleared by the [place] FDA and has been authorized for detection and/or diagnosis of SARS-CoV-2 by FDA under an Emergency Use Authorization (EUA). This EUA will remain in effect (meaning this test can be used) for the duration of the COVID-19 declaration under Section 564(b)(1) of the Act, 21 U.S.C. section 360bbb-3(b)(1), unless the authorization is terminated or revoked.  Performed at [facility], [address] [place], [place] [address]     Coagulation Studies: No results for input(s): LABPROT, INR in the last 72 hours.  Urinalysis: No results for input(s): COLORURINE, LABSPEC, PHURINE, GLUCOSEU, HGBUR, BILIRUBINUR, KETONESUR, PROTEINUR, UROBILINOGEN, NITRITE, LEUKOCYTESUR in the last 72 hours.  Invalid input(s): APPERANCEUR    Imaging: DG Chest 2 View  Result Date: 03/10/2021 CLINICAL DATA:  Shortness of breath EXAM: CHEST - 2 VIEW COMPARISON:  03/01/2021 FINDINGS: Cardiac shadow remains enlarged. Aortic calcifications are again seen. Vascular congestion is again noted and stable. Interstitial changes are noted unchanged from the prior exam without focal infiltrate or sizable effusion. No bony abnormality is noted. IMPRESSION: Stable interstitial changes of a chronic nature. No acute abnormality is noted. Electronically Signed   By: Inez Catalina M.D.   On: 03/10/2021 22:02     Medications:     calcium acetate  1,334 mg Oral TID WC   cinacalcet  90 mg Oral QPM   heparin  5,000 Units Subcutaneous Q8H   sevelamer carbonate  800 mg Oral TID WC   thiamine  100 mg Oral Daily   acetaminophen **OR** acetaminophen, ALPRAZolam, hydrALAZINE, hydrOXYzine, ondansetron **OR** ondansetron (ZOFRAN) IV  Assessment/ Plan:  Mr. MONTRELL LUSIGNAN is a 55 y.o. black male with end stage renal disease on hemodialysis, hypertension, COPD, EtOH abuse, atrial fibrillation, congestive heart failure who is admitted to [facility] on 03/10/2021 for Shortness of breath [R06.02]  [facility] M[facility] Left AVF 67kg  End Stage Renal Disease: last dialysis was Wednesday, June 15. Now with volume overload. Will need dialysis today. Orders prepared.   2. Hypertension: 146/88. Holding home BP medications. Home regimen of hydralazine and quinapril.   3. Anemia with chronic kidney disease: hemoglobin 7.5. Macrocytic. Mircera as outpatient.  - EPO with HD treatment inpatient.   4. Secondary Hyperparathyroidism: started on calcium acetate and sevelamer with meals. Cinacalcet daily.    LOS: 0 Anwyn Kriegel 6/20/20229:08 AM

## 2021-03-11 NOTE — ED Notes (Signed)
Pt assisted to bathroom, given graham crackers applesauce and po fluids

## 2021-03-11 NOTE — Progress Notes (Signed)
Patient tolerated tx without incident. VSS remained stable. Pt. concerned about buttocks discomfort ongoing and itching. AVF functioned within set parameters, no excessive post tx bleeding. Pt. cooperative with care, no concerns.

## 2021-03-11 NOTE — ED Notes (Signed)
Pt at doorway looking for nurse to ask for drink and snack. Reconnected pt to [place] monitor and provided ice water and graham crackers to pt. Explained importance of remaining on monitor.

## 2021-03-20 ENCOUNTER — Other Ambulatory Visit: Payer: Self-pay

## 2021-03-20 ENCOUNTER — Observation Stay
Admission: EM | Admit: 2021-03-20 | Discharge: 2021-03-21 | Disposition: A | Discharge status: Home / Self Care | Payer: Medicare Other | Attending: Internal Medicine | Admitting: Internal Medicine

## 2021-03-20 ENCOUNTER — Emergency Department: Payer: Medicare Other

## 2021-03-20 DIAGNOSIS — H53132 Sudden visual loss, left eye: Secondary | ICD-10-CM | POA: Insufficient documentation

## 2021-03-20 DIAGNOSIS — I10 Essential (primary) hypertension: Secondary | ICD-10-CM | POA: Diagnosis present

## 2021-03-20 DIAGNOSIS — R0600 Dyspnea, unspecified: Secondary | ICD-10-CM

## 2021-03-20 DIAGNOSIS — I5032 Chronic diastolic (congestive) heart failure: Secondary | ICD-10-CM | POA: Diagnosis present

## 2021-03-20 DIAGNOSIS — E8770 Fluid overload, unspecified: Secondary | ICD-10-CM

## 2021-03-20 DIAGNOSIS — D631 Anemia in chronic kidney disease: Secondary | ICD-10-CM | POA: Diagnosis present

## 2021-03-20 DIAGNOSIS — F17210 Nicotine dependence, cigarettes, uncomplicated: Secondary | ICD-10-CM | POA: Diagnosis not present

## 2021-03-20 DIAGNOSIS — Z720 Tobacco use: Secondary | ICD-10-CM | POA: Diagnosis present

## 2021-03-20 DIAGNOSIS — R0602 Shortness of breath: Principal | ICD-10-CM | POA: Insufficient documentation

## 2021-03-20 DIAGNOSIS — Z79899 Other long term (current) drug therapy: Secondary | ICD-10-CM | POA: Diagnosis not present

## 2021-03-20 DIAGNOSIS — I480 Paroxysmal atrial fibrillation: Secondary | ICD-10-CM | POA: Diagnosis not present

## 2021-03-20 DIAGNOSIS — I132 Hypertensive heart and chronic kidney disease with heart failure and with stage 5 chronic kidney disease, or end stage renal disease: Secondary | ICD-10-CM | POA: Insufficient documentation

## 2021-03-20 DIAGNOSIS — Z20822 Contact with and (suspected) exposure to covid-19: Secondary | ICD-10-CM | POA: Insufficient documentation

## 2021-03-20 DIAGNOSIS — J439 Emphysema, unspecified: Secondary | ICD-10-CM | POA: Diagnosis not present

## 2021-03-20 DIAGNOSIS — N186 End stage renal disease: Secondary | ICD-10-CM | POA: Insufficient documentation

## 2021-03-20 DIAGNOSIS — I5043 Acute on chronic combined systolic (congestive) and diastolic (congestive) heart failure: Secondary | ICD-10-CM | POA: Diagnosis not present

## 2021-03-20 DIAGNOSIS — Z992 Dependence on renal dialysis: Secondary | ICD-10-CM | POA: Insufficient documentation

## 2021-03-20 DIAGNOSIS — F1010 Alcohol abuse, uncomplicated: Secondary | ICD-10-CM | POA: Insufficient documentation

## 2021-03-20 DIAGNOSIS — H5462 Unqualified visual loss, left eye, normal vision right eye: Secondary | ICD-10-CM | POA: Diagnosis not present

## 2021-03-20 LAB — CBC
HCT: 22.8 % — ABNORMAL LOW (ref 39.0–52.0)
Hemoglobin: 7.4 g/dL — ABNORMAL LOW (ref 13.0–17.0)
MCH: 32.6 pg (ref 26.0–34.0)
MCHC: 32.5 g/dL (ref 30.0–36.0)
MCV: 100.4 fL — ABNORMAL HIGH (ref 80.0–100.0)
Platelets: 123 10*3/uL — ABNORMAL LOW (ref 150–400)
RBC: 2.27 MIL/uL — ABNORMAL LOW (ref 4.22–5.81)
RDW: 17.4 % — ABNORMAL HIGH (ref 11.5–15.5)
WBC: 7.3 10*3/uL (ref 4.0–10.5)
nRBC: 0 % (ref 0.0–0.2)

## 2021-03-20 LAB — BASIC METABOLIC PANEL
Anion gap: 22 — ABNORMAL HIGH (ref 5–15)
BUN: 96 mg/dL — ABNORMAL HIGH (ref 6–20)
CO2: 19 mmol/L — ABNORMAL LOW (ref 22–32)
Calcium: 9.5 mg/dL (ref 8.9–10.3)
Chloride: 105 mmol/L (ref 98–111)
Creatinine, Ser: 18.44 mg/dL — ABNORMAL HIGH (ref 0.61–1.24)
GFR, Estimated: 3 mL/min — ABNORMAL LOW (ref >60)
Glucose, Bld: 124 mg/dL — ABNORMAL HIGH (ref 70–99)
Potassium: 4.8 mmol/L (ref 3.5–5.1)
Sodium: 146 mmol/L — ABNORMAL HIGH (ref 135–145)

## 2021-03-20 LAB — RESP PANEL BY RT-PCR (FLU A&B, COVID) ARPGX2
Influenza A by PCR: NEGATIVE
Influenza B by PCR: NEGATIVE
SARS Coronavirus 2 by RT PCR: NEGATIVE

## 2021-03-20 IMAGING — CR DG CHEST 2V
2 series · 2 of 2 positions shown · non-contrast
Comparison: [DATE]

CLINICAL DATA: Shortness of breath.

EXAM:
CHEST - 2 VIEW

[chest lat]
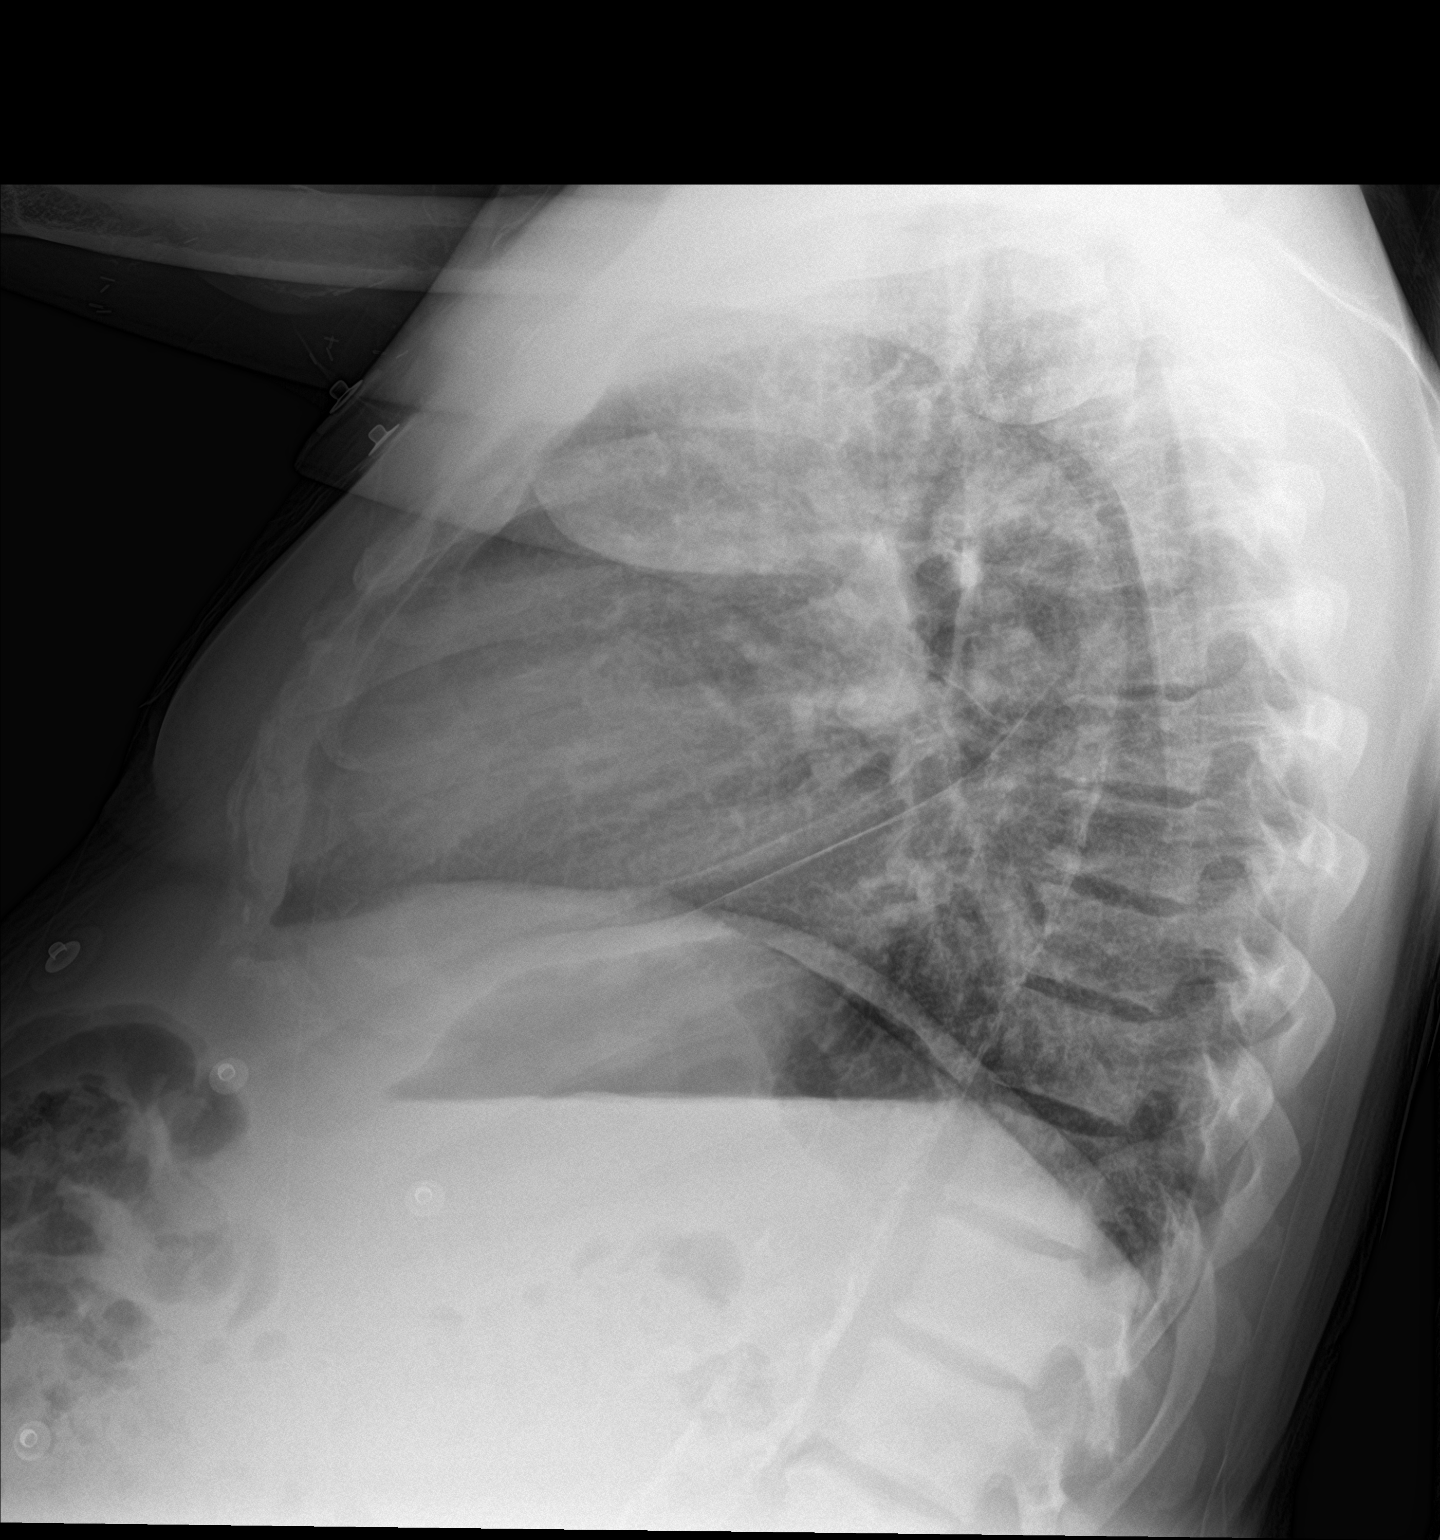

[chest ap]
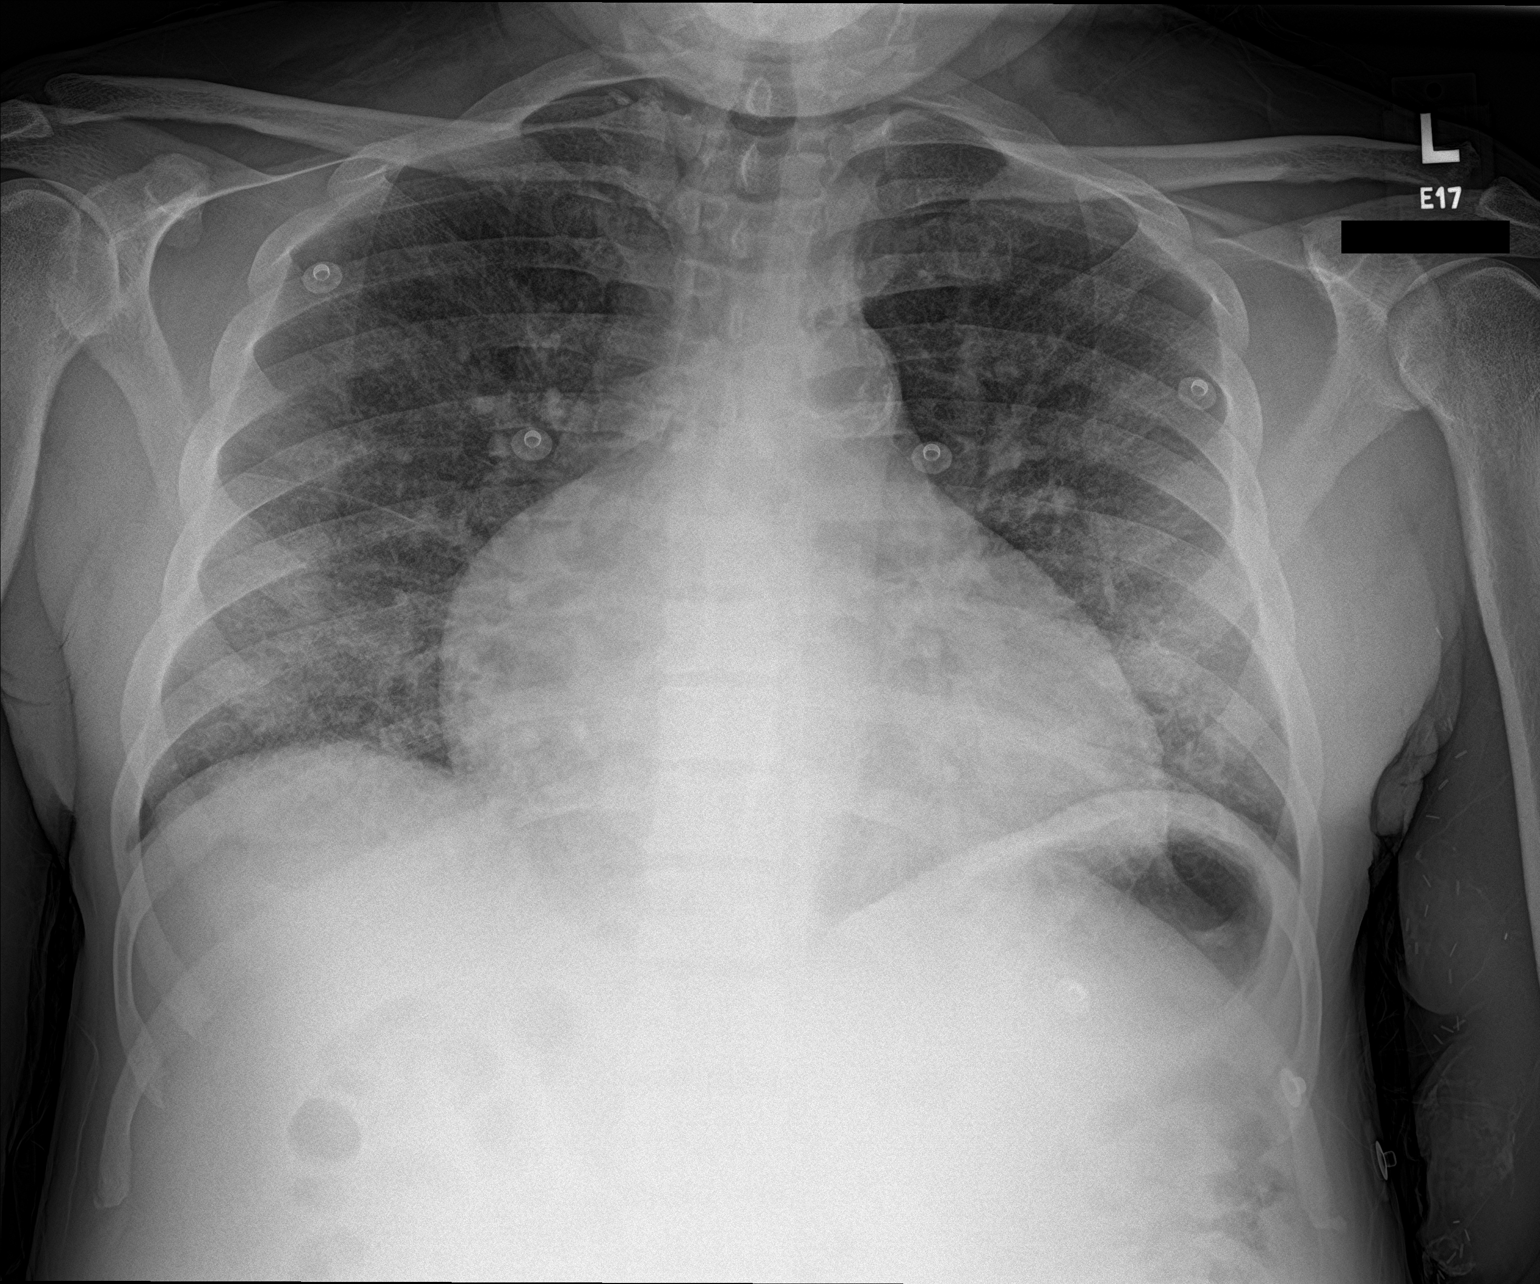

[2 of 2 positions shown; findings below may reference images not displayed]

FINDINGS: Coarse lung markings are similar to the prior examinations and
appears to be related to chronic changes. Stable cardiomegaly.
Atherosclerotic calcifications at the aortic arch. No pleural
effusions. Again noted is sclerosis of the bones and compatible with
chronic renal disease.
IMPRESSION: 1. Stable cardiomegaly.
2. Chronic lung findings without acute changes.

## 2021-03-20 MED ORDER — HYDROCORTISONE 1 % EX CREA
TOPICAL_CREAM | Freq: Two times a day (BID) | CUTANEOUS | Status: DC
  Filled 2021-03-20: qty 28

## 2021-03-20 MED ORDER — FERRIC CITRATE 1 GM 210 MG(FE) PO TABS
420.0000 mg | ORAL_TABLET | Freq: Three times a day (TID) | ORAL | Status: DC
  Administered 2021-03-21: 420 mg via ORAL
  Filled 2021-03-20 (×4): qty 2

## 2021-03-20 MED ORDER — ONDANSETRON HCL 4 MG/2ML IJ SOLN: 4.0000 mg | Freq: Three times a day (TID) | INTRAMUSCULAR | Status: DC | PRN

## 2021-03-20 MED ORDER — HYDRALAZINE HCL 50 MG PO TABS
50.0000 mg | ORAL_TABLET | Freq: Three times a day (TID) | ORAL | Status: DC
  Administered 2021-03-20 – 2021-03-21 (×2): 50 mg via ORAL
  Filled 2021-03-20 (×2): qty 1

## 2021-03-20 MED ORDER — HEPARIN SODIUM (PORCINE) 5000 UNIT/ML IJ SOLN
5000.0000 [IU] | Freq: Three times a day (TID) | INTRAMUSCULAR | Status: DC
  Administered 2021-03-20 – 2021-03-21 (×2): 5000 [IU] via SUBCUTANEOUS
  Filled 2021-03-20 (×2): qty 1

## 2021-03-20 MED ORDER — HYDROXYZINE PAMOATE 50 MG PO CAPS
50.0000 mg | ORAL_CAPSULE | Freq: Three times a day (TID) | ORAL | Status: DC | PRN
  Filled 2021-03-20: qty 1

## 2021-03-20 MED ORDER — CHLORHEXIDINE GLUCONATE CLOTH 2 % EX PADS
6.0000 | MEDICATED_PAD | Freq: Every day | CUTANEOUS | Status: DC
  Administered 2021-03-21: 6 via TOPICAL
  Filled 2021-03-20 (×2): qty 6

## 2021-03-20 MED ORDER — DM-GUAIFENESIN ER 30-600 MG PO TB12
1.0000 | ORAL_TABLET | Freq: Two times a day (BID) | ORAL | Status: DC
  Administered 2021-03-20 – 2021-03-21 (×3): 1 via ORAL
  Filled 2021-03-20 (×3): qty 1

## 2021-03-20 MED ORDER — PENTAFLUOROPROP-TETRAFLUOROETH EX AERO
1.0000 "application " | INHALATION_SPRAY | CUTANEOUS | Status: DC | PRN
  Filled 2021-03-20 (×2): qty 30

## 2021-03-20 MED ORDER — ALBUTEROL SULFATE (2.5 MG/3ML) 0.083% IN NEBU
2.5000 mg | INHALATION_SOLUTION | RESPIRATORY_TRACT | Status: DC | PRN
  Administered 2021-03-21: 2.5 mg via RESPIRATORY_TRACT
  Filled 2021-03-20: qty 3

## 2021-03-20 MED ORDER — ASPIRIN EC 81 MG PO TBEC
81.0000 mg | DELAYED_RELEASE_TABLET | Freq: Every day | ORAL | Status: DC
  Administered 2021-03-20 – 2021-03-21 (×2): 81 mg via ORAL
  Filled 2021-03-20 (×2): qty 1

## 2021-03-20 MED ORDER — FERRIC CITRATE 1 GM 210 MG(FE) PO TABS: 420.0000 mg | ORAL_TABLET | ORAL | Status: DC

## 2021-03-20 MED ORDER — HEPARIN SODIUM (PORCINE) 1000 UNIT/ML DIALYSIS
1000.0000 [IU] | INTRAMUSCULAR | Status: DC | PRN
  Filled 2021-03-20: qty 1

## 2021-03-20 MED ORDER — DIPHENHYDRAMINE HCL 50 MG/ML IJ SOLN
25.0000 mg | Freq: Three times a day (TID) | INTRAMUSCULAR | Status: DC | PRN
  Administered 2021-03-20: 25 mg via INTRAVENOUS
  Filled 2021-03-20: qty 1

## 2021-03-20 MED ORDER — THIAMINE HCL 100 MG PO TABS
100.0000 mg | ORAL_TABLET | Freq: Every day | ORAL | Status: DC
  Administered 2021-03-20 – 2021-03-21 (×2): 100 mg via ORAL
  Filled 2021-03-20 (×2): qty 1

## 2021-03-20 MED ORDER — LIDOCAINE 5 % EX OINT
1.0000 "application " | TOPICAL_OINTMENT | Freq: Two times a day (BID) | CUTANEOUS | Status: DC | PRN
  Filled 2021-03-20: qty 35.44

## 2021-03-20 MED ORDER — ACETAMINOPHEN 325 MG PO TABS
650.0000 mg | ORAL_TABLET | Freq: Four times a day (QID) | ORAL | Status: DC | PRN
Start: 2021-03-20 — End: 2021-03-21
  Administered 2021-03-20: 650 mg via ORAL
  Filled 2021-03-20: qty 2

## 2021-03-20 MED ORDER — ALTEPLASE 2 MG IJ SOLR
2.0000 mg | Freq: Once | INTRAMUSCULAR | Status: DC | PRN
  Filled 2021-03-20: qty 2

## 2021-03-20 MED ORDER — LIDOCAINE HCL (PF) 1 % IJ SOLN
5.0000 mL | INTRAMUSCULAR | Status: DC | PRN
  Filled 2021-03-20: qty 5

## 2021-03-20 MED ORDER — LIDOCAINE-PRILOCAINE 2.5-2.5 % EX CREA
1.0000 "application " | TOPICAL_CREAM | CUTANEOUS | Status: DC | PRN
  Administered 2021-03-20: 1 via TOPICAL
  Filled 2021-03-20: qty 5

## 2021-03-20 MED ORDER — SODIUM CHLORIDE 0.9 % IV SOLN: 100.0000 mL | INTRAVENOUS | Status: DC | PRN

## 2021-03-20 MED ORDER — DIPHENHYDRAMINE HCL 50 MG/ML IJ SOLN
25.0000 mg | Freq: Once | INTRAMUSCULAR | Status: AC
  Administered 2021-03-20: 25 mg via INTRAVENOUS
  Filled 2021-03-20: qty 1

## 2021-03-20 MED ORDER — CINACALCET HCL 30 MG PO TABS
60.0000 mg | ORAL_TABLET | Freq: Every day | ORAL | Status: DC
  Filled 2021-03-20: qty 2

## 2021-03-20 MED ORDER — HYDROXYZINE HCL 25 MG PO TABS
50.0000 mg | ORAL_TABLET | Freq: Three times a day (TID) | ORAL | Status: DC | PRN
  Administered 2021-03-20: 50 mg via ORAL
  Filled 2021-03-20: qty 2

## 2021-03-20 MED ORDER — ASPIRIN 81 MG PO CHEW
CHEWABLE_TABLET | ORAL | Status: AC
  Filled 2021-03-20: qty 1

## 2021-03-20 MED ORDER — CINACALCET HCL 30 MG PO TABS
60.0000 mg | ORAL_TABLET | Freq: Every day | ORAL | Status: DC
  Administered 2021-03-21: 60 mg via ORAL
  Filled 2021-03-20: qty 2

## 2021-03-20 MED ORDER — NICOTINE 21 MG/24HR TD PT24
21.0000 mg | MEDICATED_PATCH | Freq: Every day | TRANSDERMAL | Status: DC
  Administered 2021-03-20 – 2021-03-21 (×2): 21 mg via TRANSDERMAL
  Filled 2021-03-20 (×2): qty 1

## 2021-03-20 MED ORDER — HYDRALAZINE HCL 20 MG/ML IJ SOLN: 5.0000 mg | INTRAMUSCULAR | Status: DC | PRN

## 2021-03-20 MED ORDER — CALCIUM ACETATE (PHOS BINDER) 667 MG PO CAPS
1334.0000 mg | ORAL_CAPSULE | Freq: Three times a day (TID) | ORAL | Status: DC
  Administered 2021-03-21: 1334 mg via ORAL
  Filled 2021-03-20 (×3): qty 2

## 2021-03-20 MED ORDER — IPRATROPIUM-ALBUTEROL 0.5-2.5 (3) MG/3ML IN SOLN
3.0000 mL | Freq: Four times a day (QID) | RESPIRATORY_TRACT | Status: DC
  Administered 2021-03-20 – 2021-03-21 (×4): 3 mL via RESPIRATORY_TRACT
  Filled 2021-03-20 (×4): qty 3

## 2021-03-20 MED ORDER — EPOETIN ALFA 10000 UNIT/ML IJ SOLN
10000.0000 [IU] | INTRAMUSCULAR | Status: DC
  Administered 2021-03-20: 10000 [IU] via SUBCUTANEOUS
  Filled 2021-03-20 (×2): qty 1

## 2021-03-20 MED ORDER — SUCROFERRIC OXYHYDROXIDE 500 MG PO CHEW
500.0000 mg | CHEWABLE_TABLET | Freq: Three times a day (TID) | ORAL | Status: DC
  Administered 2021-03-21: 500 mg via ORAL
  Filled 2021-03-20 (×3): qty 1

## 2021-03-20 MED ORDER — METHYLPREDNISOLONE SODIUM SUCC 125 MG IJ SOLR
60.0000 mg | Freq: Once | INTRAMUSCULAR | Status: AC
  Administered 2021-03-20: 60 mg via INTRAVENOUS
  Filled 2021-03-20: qty 2

## 2021-03-20 NOTE — ED Provider Notes (Signed)
[facility] Emergency Department Provider Note  Time seen: 9:33 AM  I have reviewed the triage vital signs and the nursing notes.   HISTORY  Chief Complaint Shortness of Breath and Blurred Vision   HPI Jordan [name] is a 55 y.o. male with a past medical history of anemia, ESRD on HD Monday/Wednesday/Friday, alcohol use, hypertension who presents to the emergency department for shortness of breath.  According to the patient he received dialysis this past Monday and is supposed to have dialysis today but due to his shortness of breath he came to the emergency department.  States increased swelling and fluid accumulation.  Patient has oxygen prescribed at home to be used as needed, requiring 2 L to maintain sats above 90% currently.   Past Medical History:  Diagnosis Date   Anemia    Aortic atherosclerosis ([place]) 11/12/2019   CKD (chronic kidney disease)    Stage 5  Dialysis - M/W/F in [place], [place]   Dyspnea    tx with inhaler when sick   ED (erectile dysfunction)    Emphysema of lung (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 ([place]) 02/03/2020   Acute on chronic combined systolic and diastolic CHF (congestive heart failure) ([place]) 02/03/2020   Acute respiratory failure with hypoxia ([place]) 11/12/2019   Aortic atherosclerosis ([place]) 11/12/2019   Emphysema of lung ([place]) 11/12/2019   Acute on chronic congestive heart failure  ([place]) 10/22/2019   Volume overload 10/05/2019   Anemia in ESRD (end-stage renal disease) ([place]) 05/17/2018   ESRD on dialysis ([place]) 05/26/2017   Essential hypertension 05/26/2017   Moderate protein-calorie malnutrition ([place]) 08/13/2016   Secondary hyperparathyroidism of renal origin ([place]) 08/13/2016    Past Surgical History:  Procedure Laterality Date   BASCILIC VEIN TRANSPOSITION Left 08/07/2016   Procedure: LEFT BASILIC VEIN TRANSPOSITION;  Surgeon: Angelia Mould, MD;  Location: [facility];  Service: Vascular;  Laterality: Left;   CLOSED REDUCTION NASAL FRACTURE N/A 08/11/2019   Procedure: CLOSED REDUCTION NASAL FRACTURE WITH STABILIZATION;  Surgeon: Irene Limbo, MD;  Location: [facility];  Service: Plastics;  Laterality: N/A;   COLONOSCOPY N/A 08/12/2016   Procedure: COLONOSCOPY;  Surgeon: Otis Brace, MD;  Location: [facility];  Service: Gastroenterology;  Laterality: N/A;   ESOPHAGOGASTRODUODENOSCOPY N/A 08/12/2016   Procedure: ESOPHAGOGASTRODUODENOSCOPY (EGD);  Surgeon: Otis Brace, MD;  Location: [facility];  Service: Gastroenterology;  Laterality: N/A;   INSERTION OF DIALYSIS CATHETER N/A 08/07/2016   Procedure: INSERTION OF TUNNELED DIALYSIS CATHETER;  Surgeon: Angelia Mould, MD;  Location: [facility];  Service: Vascular;  Laterality: N/A;   LIGATION OF ARTERIOVENOUS  FISTULA Left 09/12/2016   Procedure: BANDING OF LEFT  ARTERIOVENOUS  FISTULA;  Surgeon: Angelia Mould, MD;  Location: [facility];  Service: Vascular;  Laterality: Left;   LOWER EXTREMITY INTERVENTION Right 12/02/2018   Procedure: LOWER EXTREMITY INTERVENTION;  Surgeon: Algernon Huxley, MD;  Location: [facility];  Service: Cardiovascular;  Laterality: Right;    Prior to Admission medications   Medication Sig Start Date End Date Taking? Authorizing Provider  ALPRAZolam (XANAX) 0.25 MG tablet Take 0.25 mg by mouth 2 (two) times daily as needed. 02/27/21   [provider]  AURYXIA 1  GM 210 MG(Fe) tablet Take 420 mg by mouth every Monday, Wednesday, and Friday with hemodialysis. 11/13/20   [provider]  calcium acetate (PHOSLO) 667 MG capsule Take 2 capsules (1,334 mg total) by mouth 3 (three) times daily with meals. 03/11/21   Lorella Nimrod, MD  cinacalcet (SENSIPAR) 90 MG tablet Take 1 tablet by mouth every evening. 11/28/20   [provider]  epoetin alfa (EPOGEN) 4000 UNIT/ML injection Inject 1 mL (4,000 Units total) into the vein every Monday, Wednesday, and Friday with hemodialysis. 06/22/20   Enzo Bi, MD  hydrALAZINE (APRESOLINE) 50 MG tablet Take 1 tablet (50 mg total) by mouth 3 (three) times daily as needed. 01/15/21   Lorella Nimrod, MD  hydrOXYzine (VISTARIL) 50 MG capsule Take 1 capsule (50 mg total) by mouth 3 (three) times daily as needed. 02/26/21   Cuthriell, Charline Bills, PA-C  Lidocaine 3 % CREA Apply 1 application topically 2 (two) times daily as needed (pain). 02/26/21   Cuthriell, Charline Bills, PA-C  sevelamer carbonate (RENVELA) 800 MG tablet Take 800 mg by mouth 3 (three) times daily with meals.    [provider]  thiamine 100 MG tablet Take 1 tablet (100 mg total) by mouth daily. 03/11/21   Lorella Nimrod, MD    Allergies  Allergen Reactions   Lisinopril Swelling    Family History  Problem Relation Age of Onset   Diabetes Mother    Kidney failure Mother    Healthy Father    Kidney failure Brother    Healthy Sister    Kidney disease Daughter    Post-traumatic stress disorder Neg Hx    Bladder Cancer Neg Hx    Kidney cancer Neg Hx     Social History Social History   Tobacco Use   Smoking status: Every Day    Packs/day: 0.50    Years: 40.00    Pack years: 20.00    Types: Cigarettes   Smokeless tobacco: Never  Vaping Use   Vaping Use: Never used  Substance Use Topics   Alcohol use: Yes    Alcohol/week: 21.0 standard drinks    Types: 21 Cans of beer per week    Comment: last drink 6 months ago   Drug use: No     Review of Systems Constitutional: Negative for fever. Cardiovascular: Negative for chest pain. Respiratory: Positive for shortness of breath. Gastrointestinal: Negative for abdominal pain, vomiting  Musculoskeletal: Increased peripheral fluid accumulation. Neurological: Negative for headache All other ROS negative  ____________________________________________   PHYSICAL EXAM:  VITAL SIGNS: ED Triage Vitals  Enc Vitals Group     BP 03/20/21 0728 (!) 155/97     Pulse Rate 03/20/21 0728 (!) 108     Resp 03/20/21 0728 (!) 22     Temp 03/20/21 0728 97.6 F (36.4 C)     Temp Source 03/20/21 0728 Oral     SpO2 03/20/21 0728 94 %     Weight 03/20/21 0736 158 lb (71.7 kg)     Height 03/20/21 0736 '5\' 4"'$  (1.626 m)     Head Circumference --      Peak Flow --      Pain Score 03/20/21 0736 8  Pain Loc --      Pain Edu? --      Excl. in [place]? --    Constitutional: Alert and oriented. Well appearing and in no distress. Eyes: Normal exam ENT      Head: Normocephalic and atraumatic.      Mouth/Throat: Mucous membranes are moist. Cardiovascular: Normal rate, regular rhythm around 100 bpm. Respiratory: Mild tachypnea but no obvious wheeze rales or rhonchi. Gastrointestinal: Soft and nontender. No distention.   Musculoskeletal: 2+ pitting edema to bilateral lower extremities. Neurologic:  Normal speech and language. No gross focal neurologic deficits  Skin:  Skin is warm, dry and intact.  Psychiatric: Mood and affect are normal.   ____________________________________________    EKG  EKG viewed and interpreted by myself shows sinus tachycardia 109 bpm with a narrow QRS, normal axis, normal intervals, no concerning ST changes.  ____________________________________________    RADIOLOGY  Chest x-ray is clear but does show cardiomegaly.  ____________________________________________   INITIAL IMPRESSION / ASSESSMENT AND PLAN / ED COURSE  Pertinent labs & imaging results  that were available during my care of the patient were reviewed by me and considered in my medical decision making (see chart for details).   Patient presents emergency department for worsening shortness of breath and fluid accumulation.  Received partial dialysis on Monday and was supposed to have dialysis today but the patient did not go due to shortness of breath.  Here the patient's labs showed BUN elevation of 96, potassium is 4.8 with a reassuring EKG.  Patient is tachypneic and tachycardic due to shortness of breath although chest x-ray appears clear.  We will discuss with nephrology for further plan as far as dialysis in the hospital versus discharge to his dialysis center.  I spoke to the nephrologist who recommends dialysis.  We will admit to the hospitalist service with plan for dialysis later today.  Jordan [name] was evaluated in Emergency Department on 03/20/2021 for the symptoms described in the history of present illness. He was evaluated in the context of the global COVID-19 pandemic, which necessitated consideration that the patient might be at risk for infection with the SARS-CoV-2 virus that causes COVID-19. Institutional protocols and algorithms that pertain to the evaluation of patients at risk for COVID-19 are in a state of rapid change based on information released by regulatory bodies including the CDC and federal and state organizations. These policies and algorithms were followed during the patient's care in the ED.  ____________________________________________   FINAL CLINICAL IMPRESSION(S) / ED DIAGNOSES  Dyspnea   Harvest Dark, MD 03/20/21 1043

## 2021-03-20 NOTE — ED Notes (Signed)
Pt going to Dialysis.

## 2021-03-20 NOTE — Consult Note (Addendum)
NEURO HOSPITALIST CONSULT NOTE   Requestig physician: Dr. Blaine Hamper  Reason for Consult: One week history of left monocular vision loss  History obtained from:   Patient and Chart     HPI:                                                                                                                                          Jordan [name] is an 55 y.o. male with a PMHx of ESRD on HD MWF, anemia, combined heart failure, hypertension, paroxysmal atrial fibrillation, COPD requiring home oxygen, alcohol use and noncompliance with dialysis who was recently dischargef from the hospital after management of volume overload secondary to missed dialysis sessions. He re-presented this morning with symptoms of recurrent SOB, swelling and blurred vision. He stated to the Triage RN that he has been unable to complete a full session of dialysis in over a month due to rash and itching. He was tachypneic and tachycardic in the ED.   Labs in the ED were consistent with his ESRD. EKG was reassuring. CXR appeared clear.   The patient also reported a one week history of vision loss in his left eye. Neurology and Ophthalmology have been consulted. He denies eye pain. He states he noticed the loss of vision on awakening about one week ago. He states that his vision is completely black in his left eye with no light sensation. Right eye is with normal baseline vision.   Past Medical History:  Diagnosis Date   Anemia    Aortic atherosclerosis ([place]) 11/12/2019   CKD (chronic kidney disease)    Stage 5  Dialysis - M/W/F in [place], [place]   Dyspnea    tx with inhaler when sick   ED (erectile dysfunction)    Emphysema of lung ([place]) 11/12/2019   ETOH abuse    History of blood transfusion    Hypertension    Wears dentures     Past Surgical History:  Procedure Laterality Date   BASCILIC VEIN TRANSPOSITION Left 08/07/2016   Procedure: LEFT BASILIC VEIN TRANSPOSITION;  Surgeon: Angelia Mould,  MD;  Location: [facility];  Service: Vascular;  Laterality: Left;   CLOSED REDUCTION NASAL FRACTURE N/A 08/11/2019   Procedure: CLOSED REDUCTION NASAL FRACTURE WITH STABILIZATION;  Surgeon: Irene Limbo, MD;  Location: [facility];  Service: Plastics;  Laterality: N/A;   COLONOSCOPY N/A 08/12/2016   Procedure: COLONOSCOPY;  Surgeon: Otis Brace, MD;  Location: [facility];  Service: Gastroenterology;  Laterality: N/A;   ESOPHAGOGASTRODUODENOSCOPY N/A 08/12/2016   Procedure: ESOPHAGOGASTRODUODENOSCOPY (EGD);  Surgeon: Otis Brace, MD;  Location: [facility];  Service: Gastroenterology;  Laterality: N/A;   INSERTION OF DIALYSIS CATHETER N/A 08/07/2016   Procedure: INSERTION OF TUNNELED DIALYSIS CATHETER;  Surgeon: Angelia Mould, MD;  [facility];  Service: Vascular;  Laterality: N/A;   LIGATION OF ARTERIOVENOUS  FISTULA Left 09/12/2016   Procedure: BANDING OF LEFT  ARTERIOVENOUS  FISTULA;  Surgeon: Angelia Mould, MD;  Location: [facility];  Service: Vascular;  Laterality: Left;   LOWER EXTREMITY INTERVENTION Right 12/02/2018   Procedure: LOWER EXTREMITY INTERVENTION;  Surgeon: Algernon Huxley, MD;  Location: [facility];  Service: Cardiovascular;  Laterality: Right;    Family History  Problem Relation Age of Onset   Diabetes Mother    Kidney failure Mother    Healthy Father    Kidney failure Brother    Healthy Sister    Kidney disease Daughter    Post-traumatic stress disorder Neg Hx    Bladder Cancer Neg Hx    Kidney cancer Neg Hx             Social History:  reports that he has been smoking cigarettes. He has a 20.00 pack-year smoking history. He has never used smokeless tobacco. He reports current alcohol use of about 21.0 standard drinks of alcohol per week. He reports that he does not use drugs.  Allergies  Allergen Reactions   Lisinopril Swelling    MEDICATIONS:                                                                                                                      No current facility-administered medications on file prior to encounter.   Current Outpatient Medications on File Prior to Encounter  Medication Sig Dispense Refill   AURYXIA 1 GM 210 MG(Fe) tablet Take 420 mg by mouth every Monday, Wednesday, and Friday with hemodialysis.     calcium acetate (PHOSLO) 667 MG capsule Take 2 capsules (1,334 mg total) by mouth 3 (three) times daily with meals. 90 capsule 2   cinacalcet (SENSIPAR) 60 MG tablet SMARTSIG:2 Tablet(s) By Mouth Every Evening     hydrOXYzine (VISTARIL) 50 MG capsule Take 1 capsule (50 mg total) by mouth 3 (three) times daily as needed. 30 capsule 0   Lidocaine 3 % CREA Apply 1 application topically 2 (two) times daily as needed (pain). 30 g 0   thiamine 100 MG tablet Take 1 tablet (100 mg total) by mouth daily.     VELPHORO 500 MG chewable tablet Chew 500 mg by mouth 3 (three) times daily.     ALPRAZolam (XANAX) 0.25 MG tablet Take 0.25 mg by mouth 2 (two) times daily as needed. (Patient not taking: Reported on 03/20/2021)     cinacalcet (SENSIPAR) 90 MG tablet Take 1 tablet by mouth every evening. (Patient not taking: Reported on 03/20/2021)     epoetin alfa (EPOGEN) 4000 UNIT/ML injection Inject 1 mL (4,000 Units total) into the vein every Monday, Wednesday, and Friday with hemodialysis. 1 mL    hydrALAZINE (APRESOLINE) 50 MG tablet Take 1 tablet (50 mg total) by mouth 3 (three) times daily as needed. (Patient not taking: Reported on 03/20/2021) 90 tablet 0   hydrOXYzine (ATARAX/VISTARIL)  25 MG tablet Take 25 mg by mouth 3 (three) times daily. (Patient not taking: Reported on 03/20/2021)     sevelamer carbonate (RENVELA) 800 MG tablet Take 800 mg by mouth 3 (three) times daily with meals. (Patient not taking: Reported on 03/20/2021)       Scheduled:  aspirin EC  81 mg Oral Daily   Chlorhexidine Gluconate Cloth  6 each Topical Q0600   dextromethorphan-guaiFENesin  1 tablet Oral BID   ipratropium-albuterol  3 mL  Nebulization Q6H   nicotine  21 mg Transdermal Daily   Continuous:  sodium chloride     sodium chloride       ROS:                                                                                                                                       As per HPI. The patient is drowsy and in this context is not able to provide a comprehensive ROS.    Blood pressure 138/90, pulse (!) 107, temperature 97.6 F (36.4 C), temperature source Oral, resp. rate (!) 24, height '5\' 4"'$  (1.626 m), weight 71.7 kg, SpO2 94 %.   General Examination:                                                                                                       Physical Exam  HEENT-  [place]/AT. No scleral injection bilaterally. No corneal opacities.    Lungs-Sonorous breathing pattern Extremities- Warm and well perfused   Neurological Examination Mental Status: Drowsy. Speech is fluent with intact comprehension for basic commands. Naming intact. Oriented x 5. Poor insight.  Cranial Nerves: II: Complete vision loss OS - unable to detect light. Vision OD is normal; able to identify objects and read text with visual fields intact. Left pupil 4 mm and reactive to 3 mm. Right pupil 3 mm and reactive to 2 mm.   III,IV, VI: No ptosis. EOMI.   V,VII: Smile symmetric, facial temp sensation equal bilaterally VIII: hearing intact to voice IX,X: No hypophonia XI: Symmetric XII: midline tongue extension Motor: Right : Upper extremity   5/5    Left:     Upper extremity   5/5  Lower extremity   5/5     Lower extremity   5/5 Normal tone throughout; no atrophy noted Sensory: Temp and light touch intact throughout, bilaterally. No extinction to DSS.  Deep Tendon Reflexes: 2+ and symmetric throughout Cerebellar: No ataxia with FNF bilaterally.  Gait: Deferred  Lab Results: Basic Metabolic Panel: Recent Labs  Lab 03/20/21 0740  NA 146*  K 4.8  CL 105  CO2 19*  GLUCOSE 124*  BUN 96*  CREATININE 18.44*  CALCIUM 9.5     CBC: Recent Labs  Lab 03/20/21 0740  WBC 7.3  HGB 7.4*  HCT 22.8*  MCV 100.4*  PLT 123*    Cardiac Enzymes: No results for input(s): CKTOTAL, CKMB, CKMBINDEX, TROPONINI in the last 168 hours.  Lipid Panel: No results for input(s): CHOL, TRIG, HDL, CHOLHDL, VLDL, LDLCALC in the last 168 hours.  Imaging: DG Chest 2 View  Result Date: 03/20/2021 CLINICAL DATA:  Shortness of breath. EXAM: CHEST - 2 VIEW COMPARISON:  03/10/2021 FINDINGS: Coarse lung markings are similar to the prior examinations and appears to be related to chronic changes. Stable cardiomegaly. Atherosclerotic calcifications at the aortic arch. No pleural effusions. Again noted is sclerosis of the bones and compatible with chronic renal disease. IMPRESSION: 1. Stable cardiomegaly. 2. Chronic lung findings without acute changes. Electronically Signed   By: Markus Daft M.D.   On: 03/20/2021 08:14     Assessment: 55 year old male with ESRD on HD, SOB due to possible fluid overload and a 1 week history of left monocular vision loss.  1. Exam reveals complete vision loss in the left eye. No language, coordination, visual field defects in the right eye, motor or sensory deficits to suggest a stroke involving the cerebral hemispheres, brainstem or cerebellum.  2. Atrial fibrillation. On ASA as an outpatient, not on anticoagulation.  3. CT head from 6/20: No acute intracranial abnormalities. Mild chronic microvascular ischemic change. 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 4. Exam findings are localizable to the left eye, most likely due to retinal damage. DDx includes CRAO and CRVO. Retinal hemorrhage or retinal detachment also possible. Embolic phenomenon leading to CRAO is relatively high on the DDx given the patient's atrial fibrillation.  5. He has had a recent TTE (June 11), which EF of 50%. No mural thrombus or atrial septal defect was mentioned in the report.     Recommendations: 1. MRI of brain and orbits  WITHOUT contrast 2. May need a transesophageal echo given that recent TTE was negative for mural thrombus, vegetation or ASD.   3. CTA of head and neck to assess for atherosclerotic disease. CTA of head and neck is safe as he has ESRD and is on dialysis. Need to follow CTA with dialysis to remove contrast material.  4. Cardiac telemetry. 5. Ophthalmology consult for dilated retinal exam.  6. If he is diagnosed with CRVO, he will need a hypercoagulable work up.  7. Continue ASA for now, which has been started this admission. May need to be started on anticoagulation. When he is fully awake, will need to determine from the patient why he is not on anticoagulation.    Electronically signed: Dr. Kerney Elbe 03/20/2021, 2:40 PM

## 2021-03-20 NOTE — ED Triage Notes (Signed)
Pt arrives to ed via pov with c/o sob, blurred vision and swelling. Pt states he has been unable to complete a full session of dialysis in over a month due to rash, and itching. Pitting edema noted in bilateral lower extremities. Pt a&o x 4, able to speak in full sentences with equal chest rise and fall.

## 2021-03-20 NOTE — Progress Notes (Signed)
Pt ended HD tx 40 minutes early AMA. HD MD notified and report to primary RN. Pt being transported from HD to MRI as ordered. 1.9L fluid removed. Did not meet UF goal. Pt respirations WNL post HD, still ST on monitor, A&O and stable.  Epogen 10,000 units and Tylenol '650mg'$  PO for bilateral buttock pain given with HD. CCMD notified of pt departure from HD unit. AVF sites held for 17 minutes. Access positive for thrill and bruit post HD.

## 2021-03-20 NOTE — Progress Notes (Signed)
Pre HD info 

## 2021-03-20 NOTE — ED Notes (Addendum)
Pt placed on 2L nasal cannula for comfort. Pt states he wears 2L PRN at home.

## 2021-03-20 NOTE — Progress Notes (Addendum)
[facility]  ROUNDING NOTE   Subjective:   Jordan [name] is a 55 y.o. male with medical concerns including hypertension, anemia, tobacco and alcohol abuse, and ESRD on dialysis. He presents to the ED complaining of shortness of breath and left eye vision loss.   We have been consulted to manage dialysis care. He receives dialysis at [facility] followed by [facility]. He received a partial treatment on Monday. He states he itches so bad that he he can't sit for his full treatment. This has been a concern for years. Patients states his shortness of breath was getting worse, but with progressive vision loss in left eye for over a week, he decided to come to ED. He states he can see a small amount but not details. Denies nausea, vomiting and diarrhea. Denies numbness, tingling and change in sensation on limbs.    Objective:  Vital signs in last 24 hours:  Temp:  [97.6 F (36.4 C)] 97.6 F (36.4 C) (06/29 0728) Pulse Rate:  [87-108] 107 (06/29 1200) Resp:  [20-24] 24 (06/29 1200) BP: (138-162)/(90-97) 138/90 (06/29 1200) SpO2:  [94 %] 94 % (06/29 1200) Weight:  [71.7 kg] 71.7 kg (06/29 0736)  Weight change:  Filed Weights   03/20/21 0736  Weight: 71.7 kg    Intake/Output: No intake/output data recorded.   Intake/Output this shift:  No intake/output data recorded.  Physical Exam: General: NAD, laying on stretcher  Head: Normocephalic, atraumatic. Moist oral mucosal membranes  Eyes: Anicteric, left eye periorbital edema  Lungs:  Clear to auscultation, normal breathing effort  Heart: Regular rate and rhythm  Abdomen:  Soft, nontender,   Extremities:  2+ peripheral edema.  Neurologic: Nonfocal, moving all four extremities  Skin: No lesions  Access: Lt AVF    Basic Metabolic Panel: Recent Labs  Lab 03/20/21 0740  NA 146*  K 4.8  CL 105  CO2 19*  GLUCOSE 124*  BUN 96*  CREATININE 18.44*  CALCIUM 9.5    Liver Function Tests: No results for  input(s): AST, ALT, ALKPHOS, BILITOT, PROT, ALBUMIN in the last 168 hours. No results for input(s): LIPASE, AMYLASE in the last 168 hours. No results for input(s): AMMONIA in the last 168 hours.  CBC: Recent Labs  Lab 03/20/21 0740  WBC 7.3  HGB 7.4*  HCT 22.8*  MCV 100.4*  PLT 123*    Cardiac Enzymes: No results for input(s): CKTOTAL, CKMB, CKMBINDEX, TROPONINI in the last 168 hours.  BNP: Invalid input(s): POCBNP  CBG: No results for input(s): GLUCAP in the last 168 hours.  Microbiology: Results for orders placed or performed during the hospital encounter of 03/20/21  Resp Panel by RT-PCR (Flu A&B, Covid) Nasopharyngeal Swab     Status: None   Collection Time: 03/20/21 10:51 AM   Specimen: Nasopharyngeal Swab; Nasopharyngeal(NP) swabs in vial transport medium  Result Value Ref Range Status   SARS Coronavirus 2 by RT PCR NEGATIVE NEGATIVE Final    Comment: (NOTE) SARS-CoV-2 target nucleic acids are NOT DETECTED.  The SARS-CoV-2 RNA is generally detectable in upper respiratory specimens during the acute phase of infection. The lowest concentration of SARS-CoV-2 viral copies this assay can detect is 138 copies/mL. A negative result does not preclude SARS-Cov-2 infection and should not be used as the sole basis for treatment or other patient management decisions. A negative result may occur with  improper specimen collection/handling, submission of specimen other than nasopharyngeal swab, presence of viral mutation(s) within the areas targeted by  this assay, and inadequate number of viral copies(<138 copies/mL). A negative result must be combined with clinical observations, patient history, and epidemiological information. The expected result is Negative.  Fact Sheet for Patients:  EntrepreneurPulse.com.au  Fact Sheet for Healthcare Providers:  IncredibleEmployment.be  This test is no t yet approved or cleared by the [place]  FDA and  has been authorized for detection and/or diagnosis of SARS-CoV-2 by FDA under an Emergency Use Authorization (EUA). This EUA will remain  in effect (meaning this test can be used) for the duration of the COVID-19 declaration under Section 564(b)(1) of the Act, 21 U.S.C.section 360bbb-3(b)(1), unless the authorization is terminated  or revoked sooner.       Influenza A by PCR NEGATIVE NEGATIVE Final   Influenza B by PCR NEGATIVE NEGATIVE Final    Comment: (NOTE) The Xpert Xpress SARS-CoV-2/FLU/RSV plus assay is intended as an aid in the diagnosis of influenza from Nasopharyngeal swab specimens and should not be used as a sole basis for treatment. Nasal washings and aspirates are unacceptable for Xpert Xpress SARS-CoV-2/FLU/RSV testing.  Fact Sheet for Patients: EntrepreneurPulse.com.au  Fact Sheet for Healthcare Providers: IncredibleEmployment.be  This test is not yet approved or cleared by the [place] FDA and has been authorized for detection and/or diagnosis of SARS-CoV-2 by FDA under an Emergency Use Authorization (EUA). This EUA will remain in effect (meaning this test can be used) for the duration of the COVID-19 declaration under Section 564(b)(1) of the Act, 21 U.S.C. section 360bbb-3(b)(1), unless the authorization is terminated or revoked.  Performed at [facility], [address] [place], [place] [address]     Coagulation Studies: No results for input(s): LABPROT, INR in the last 72 hours.  Urinalysis: No results for input(s): COLORURINE, LABSPEC, PHURINE, GLUCOSEU, HGBUR, BILIRUBINUR, KETONESUR, PROTEINUR, UROBILINOGEN, NITRITE, LEUKOCYTESUR in the last 72 hours.  Invalid input(s): APPERANCEUR    Imaging: DG Chest 2 View  Result Date: 03/20/2021 CLINICAL DATA:  Shortness of breath. EXAM: CHEST - 2 VIEW COMPARISON:  03/10/2021 FINDINGS: Coarse lung markings are similar to the prior examinations  and appears to be related to chronic changes. Stable cardiomegaly. Atherosclerotic calcifications at the aortic arch. No pleural effusions. Again noted is sclerosis of the bones and compatible with chronic renal disease. IMPRESSION: 1. Stable cardiomegaly. 2. Chronic lung findings without acute changes. Electronically Signed   By: Markus Daft M.D.   On: 03/20/2021 08:14     Medications:    sodium chloride     sodium chloride      aspirin EC  81 mg Oral Daily   Chlorhexidine Gluconate Cloth  6 each Topical Q0600   dextromethorphan-guaiFENesin  1 tablet Oral BID   ipratropium-albuterol  3 mL Nebulization Q6H   nicotine  21 mg Transdermal Daily   sodium chloride, sodium chloride, acetaminophen, albuterol, alteplase, heparin, hydrALAZINE, lidocaine (PF), lidocaine-prilocaine, ondansetron (ZOFRAN) IV, pentafluoroprop-tetrafluoroeth  Assessment/ Plan:  Mr. HRISHIKESH SOMMA is a 55 y.o.  male with medical concerns including hypertension, anemia, tobacco and alcohol abuse, and ESRD on dialysis. He presents to the ED complaining of shortness of breath and left eye vision loss.   [facility] [address]/MWF/Lt AVF  Shortness of breath  -Requiring oxygen supplementation -concern about volume overload -plan for urgent HD today  2. End stage renal disease on dialysis Will maintain outpatient schedule while inpatient Received partial treatment on Monday Will dialyze today  UF goal 3L Next treatment scheduled for Friday Or sooner based on respiratory status  3. Anemia of chronic kidney disease   Lab Results  Component Value Date   HGB 7.4 (L) 03/20/2021   Hgb below target EPO with treatments  4. Secondary Hyperparathyroidism:   Lab Results  Component Value Date   PTH 669 (H) 02/20/2020   CALCIUM 9.5 03/20/2021   CAION 1.26 04/07/2020   PHOS 10.0 (H) 03/11/2021   Phosphorus elevated  Auryxia ordered with meals Can consider phoslo if phos remains high   LOS: 0 Shantelle  Breeze 6/29/20222:52 PM   Patient was seen and evaluated with Colon Flattery, NP.  Plan of care was discussed with patient as well as NP.  I agree with the note as documented.

## 2021-03-20 NOTE — Plan of Care (Signed)
  Problem: Education: Goal: Knowledge of General Education information will improve Description: Including pain rating scale, medication(s)/side effects and non-pharmacologic comfort measures Outcome: Progressing   Problem: Clinical Measurements: Goal: Respiratory complications will improve Outcome: Progressing   Problem: Activity: Goal: Risk for activity intolerance will decrease Outcome: Progressing   

## 2021-03-20 NOTE — ED Notes (Signed)
Pt keeps removing BP cuff, cardiac monitor despite patient education. Denies complaints at this time.

## 2021-03-20 NOTE — Progress Notes (Signed)
Pt HD tx started at 1520 in HD unit on [facility]. HD team initially reported to ED room to dialyze patient. Delay in HD tx due to inability to connect dialysis and water machines to sink as necessary. ED staff/charge nurse and HD MD aware of problem. Pt is restless, c/o of pain 10/10, severe itching and tachypneic. AVF cannulated w/ no complications. Pt is bleeding from several boils on buttocks and legs.

## 2021-03-20 NOTE — H&P (Signed)
History and Physical    Jordan [name] [id number] DOB: [birth year] DOA: 03/20/2021  Referring MD/NP/PA:   PCP: Pcp, No   Patient coming from:  The patient is coming from home.  At baseline, pt is independent for most of ADL.        Chief Complaint: SOB and left eye vision loss  HPI: Jordan [name] is a 55 y.o. male with medical history significant of hypertension, alcohol abuse in remission, tobacco abuse, anemia, ESRD-HD (MWF), noncompliance to dialysis, who presents with shortness of breath and left eye vision loss.  Pt states that he had half session of dialysis on Monday, he is supposed to have dialysis today but due to worsening shortness of breath he came to the emergency department.  He has cough with little brownish colored sputum production.  Denies chest pain.  No fever or chills.  Patient stated he had 1 small loose stool bowel movement earlier, no nausea, vomiting, abdominal pain.  He states that he has severe vision loss in the left eye which has been going for more than 1 week.  No unilateral numbness or tingling to extremities.  No facial droop or slurred speech.  No hearing loss.  He is wearing 2 L oxygen as needed.  He states that he has itching all over.   ED Course: pt was found to have WBC 7.3, pending COVID-19 PCR, potassium 4.8, bicarbonate 19, creatinine 18.44, BUN 96, temperature normal, blood pressure 162/91, heart rate 108, 87, RR 22, oxygen saturation 94% on home level 2 L oxygen, chest x-ray showed cardiomegaly and chronic lung disease.  Patient is placed on progressive bed for observation.  Dr. Candiss Norse of renal, Dr. George Ina of ophthalmology, Dr. Cheral Marker of neurology are consulted.  Review of Systems:   General: no fevers, chills, no body weight gain, has fatigue HEENT: no blurry vision, hearing changes or sore throat Respiratory: has dyspnea, coughing, no wheezing CV: no chest pain, no palpitations GI: no nausea, vomiting, abdominal pain, diarrhea,  constipation GU: no dysuria, burning on urination, increased urinary frequency, hematuria  Ext: has leg edema Neuro: no unilateral weakness, numbness, or tingling, has severe left eye vision change, no hearing loss Skin: no rash, no skin tear. Has itching all over MSK: No muscle spasm, no deformity, no limitation of range of movement in spin Heme: No easy bruising.  Travel history: No recent long distant travel.  Allergy:  Allergies  Allergen Reactions   Lisinopril Swelling    Past Medical History:  Diagnosis Date   Anemia    Aortic atherosclerosis ([place]) 11/12/2019   CKD (chronic kidney disease)    Stage 5  Dialysis - M/W/F in [place], [place]   Dyspnea    tx with inhaler when sick   ED (erectile dysfunction)    Emphysema of lung ([place]) 11/12/2019   ETOH abuse    History of blood transfusion    Hypertension    Wears dentures     Past Surgical History:  Procedure Laterality Date   BASCILIC VEIN TRANSPOSITION Left 08/07/2016   Procedure: LEFT BASILIC VEIN TRANSPOSITION;  Surgeon: Angelia Mould, MD;  Location: [facility];  Service: Vascular;  Laterality: Left;   CLOSED REDUCTION NASAL FRACTURE N/A 08/11/2019   Procedure: CLOSED REDUCTION NASAL FRACTURE WITH STABILIZATION;  Surgeon: Irene Limbo, MD;  Location: [facility];  Service: Plastics;  Laterality: N/A;   COLONOSCOPY N/A 08/12/2016   Procedure: COLONOSCOPY;  Surgeon: Otis Brace, MD;  Location: [facility];  Service: Gastroenterology;  Laterality: N/A;   ESOPHAGOGASTRODUODENOSCOPY N/A 08/12/2016   Procedure: ESOPHAGOGASTRODUODENOSCOPY (EGD);  Surgeon: Otis Brace, MD;  Location: [facility];  Service: Gastroenterology;  Laterality: N/A;   INSERTION OF DIALYSIS CATHETER N/A 08/07/2016   Procedure: INSERTION OF TUNNELED DIALYSIS CATHETER;  Surgeon: Angelia Mould, MD;  Location: [facility];  Service: Vascular;  Laterality: N/A;   LIGATION OF ARTERIOVENOUS  FISTULA Left 09/12/2016   Procedure: BANDING OF LEFT   ARTERIOVENOUS  FISTULA;  Surgeon: Angelia Mould, MD;  Location: [facility];  Service: Vascular;  Laterality: Left;   LOWER EXTREMITY INTERVENTION Right 12/02/2018   Procedure: LOWER EXTREMITY INTERVENTION;  Surgeon: Algernon Huxley, MD;  Location: [facility];  Service: Cardiovascular;  Laterality: Right;    Social History:  reports that he has been smoking cigarettes. He has a 20.00 pack-year smoking history. He has never used smokeless tobacco. He reports current alcohol use of about 21.0 standard drinks of alcohol per week. He reports that he does not use drugs.  Family History:  Family History  Problem Relation Age of Onset   Diabetes Mother    Kidney failure Mother    Healthy Father    Kidney failure Brother    Healthy Sister    Kidney disease Daughter    Post-traumatic stress disorder Neg Hx    Bladder Cancer Neg Hx    Kidney cancer Neg Hx      Prior to Admission medications   Medication Sig Start Date End Date Taking? Authorizing Provider  ALPRAZolam (XANAX) 0.25 MG tablet Take 0.25 mg by mouth 2 (two) times daily as needed. 02/27/21   [provider]  AURYXIA 1 GM 210 MG(Fe) tablet Take 420 mg by mouth every Monday, Wednesday, and Friday with hemodialysis. 11/13/20   [provider]  calcium acetate (PHOSLO) 667 MG capsule Take 2 capsules (1,334 mg total) by mouth 3 (three) times daily with meals. 03/11/21   Lorella Nimrod, MD  cinacalcet (SENSIPAR) 90 MG tablet Take 1 tablet by mouth every evening. 11/28/20   [provider]  epoetin alfa (EPOGEN) 4000 UNIT/ML injection Inject 1 mL (4,000 Units total) into the vein every Monday, Wednesday, and Friday with hemodialysis. 06/22/20   Enzo Bi, MD  hydrALAZINE (APRESOLINE) 50 MG tablet Take 1 tablet (50 mg total) by mouth 3 (three) times daily as needed. 01/15/21   Lorella Nimrod, MD  hydrOXYzine (VISTARIL) 50 MG capsule Take 1 capsule (50 mg total) by mouth 3 (three) times daily as needed. 02/26/21    Cuthriell, Charline Bills, PA-C  Lidocaine 3 % CREA Apply 1 application topically 2 (two) times daily as needed (pain). 02/26/21   Cuthriell, Charline Bills, PA-C  sevelamer carbonate (RENVELA) 800 MG tablet Take 800 mg by mouth 3 (three) times daily with meals.    [provider]  thiamine 100 MG tablet Take 1 tablet (100 mg total) by mouth daily. 03/11/21   Lorella Nimrod, MD    Physical Exam: Vitals:   03/20/21 1715 03/20/21 1730 03/20/21 1740 03/20/21 1750  BP: (!) 142/95 (!) 141/76 (!) 154/88 127/65  Pulse: (!) 105 (!) 106 (!) 107 (!) 107  Resp: '19 18 19 20  '$ Temp:   98.1 F (36.7 C)   TempSrc:   Oral   SpO2: 100% 99% 100% 99%  Weight:      Height:       General: Not in acute distress HEENT:       Eyes: PERRL, EOMI, no scleral icterus.  ENT: No discharge from the ears and nose, no pharynx injection, no tonsillar enlargement.        Neck: No JVD, no bruit, no mass felt. Heme: No neck lymph node enlargement. Cardiac: S1/S2, RRR, No murmurs, No gallops or rubs. Respiratory: No rales, wheezing, rhonchi or rubs. GI: Soft, nondistended, nontender, no rebound pain, no organomegaly, BS present. GU: No hematuria Ext: has 1+ leg edema bilaterally. 1+DP/PT pulse bilaterally. Musculoskeletal: No joint deformities, No joint redness or warmth, no limitation of ROM in spin. Skin: No rashes.  Neuro: Alert, oriented X3, cranial nerves IV-XII grossly intact, moves all extremities normally. Has severe left eye vision loss  Psych: Patient is not psychotic, no suicidal or hemocidal ideation.  Labs on Admission: I have personally reviewed following labs and imaging studies  CBC: Recent Labs  Lab 03/20/21 0740  WBC 7.3  HGB 7.4*  HCT 22.8*  MCV 100.4*  PLT [id number]*   Basic Metabolic Panel: Recent Labs  Lab 03/20/21 0740  NA 146*  K 4.8  CL 105  CO2 19*  GLUCOSE 124*  BUN 96*  CREATININE 18.44*  CALCIUM 9.5   GFR: Estimated Creatinine Clearance: 4.2 mL/min (A) (by C-G formula  based on SCr of 18.44 mg/dL (H)). Liver Function Tests: No results for input(s): AST, ALT, ALKPHOS, BILITOT, PROT, ALBUMIN in the last 168 hours. No results for input(s): LIPASE, AMYLASE in the last 168 hours. No results for input(s): AMMONIA in the last 168 hours. Coagulation Profile: No results for input(s): INR, PROTIME in the last 168 hours. Cardiac Enzymes: No results for input(s): CKTOTAL, CKMB, CKMBINDEX, TROPONINI in the last 168 hours. BNP (last 3 results) No results for input(s): PROBNP in the last 8760 hours. HbA1C: No results for input(s): HGBA1C in the last 72 hours. CBG: No results for input(s): GLUCAP in the last 168 hours. Lipid Profile: No results for input(s): CHOL, HDL, LDLCALC, TRIG, CHOLHDL, LDLDIRECT in the last 72 hours. Thyroid Function Tests: No results for input(s): TSH, T4TOTAL, FREET4, T3FREE, THYROIDAB in the last 72 hours. Anemia Panel: No results for input(s): VITAMINB12, FOLATE, FERRITIN, TIBC, IRON, RETICCTPCT in the last 72 hours. Urine analysis:    Component Value Date/Time   COLORURINE YELLOW (A) 01/13/2021 0153   APPEARANCEUR CLEAR (A) 01/13/2021 0153   LABSPEC 1.012 01/13/2021 0153   PHURINE 9.0 (H) 01/13/2021 0153   GLUCOSEU 150 (A) 01/13/2021 0153   HGBUR NEGATIVE 01/13/2021 0153   BILIRUBINUR NEGATIVE 01/13/2021 0153   KETONESUR NEGATIVE 01/13/2021 0153   PROTEINUR >=300 (A) 01/13/2021 0153   NITRITE NEGATIVE 01/13/2021 0153   LEUKOCYTESUR NEGATIVE 01/13/2021 0153   Sepsis Labs: '@LABRCNTIP'$ (procalcitonin:4,lacticidven:4) ) Recent Results (from the past 240 hour(s))  Resp Panel by RT-PCR (Flu A&B, Covid) Nasopharyngeal Swab     Status: None   Collection Time: 03/10/21 10:27 PM   Specimen: Nasopharyngeal Swab; Nasopharyngeal(NP) swabs in vial transport medium  Result Value Ref Range Status   SARS Coronavirus 2 by RT PCR NEGATIVE NEGATIVE Final    Comment: (NOTE) SARS-CoV-2 target nucleic acids are NOT DETECTED.  The SARS-CoV-2 RNA  is generally detectable in upper respiratory specimens during the acute phase of infection. The lowest concentration of SARS-CoV-2 viral copies this assay can detect is 138 copies/mL. A negative result does not preclude SARS-Cov-2 infection and should not be used as the sole basis for treatment or other patient management decisions. A negative result may occur with  improper specimen collection/handling, submission of specimen other than nasopharyngeal swab, presence of viral mutation(s)  within the areas targeted by this assay, and inadequate number of viral copies(<138 copies/mL). A negative result must be combined with clinical observations, patient history, and epidemiological information. The expected result is Negative.  Fact Sheet for Patients:  EntrepreneurPulse.com.au  Fact Sheet for Healthcare Providers:  IncredibleEmployment.be  This test is no t yet approved or cleared by the [place] FDA and  has been authorized for detection and/or diagnosis of SARS-CoV-2 by FDA under an Emergency Use Authorization (EUA). This EUA will remain  in effect (meaning this test can be used) for the duration of the COVID-19 declaration under Section 564(b)(1) of the Act, 21 U.S.C.section 360bbb-3(b)(1), unless the authorization is terminated  or revoked sooner.       Influenza A by PCR NEGATIVE NEGATIVE Final   Influenza B by PCR NEGATIVE NEGATIVE Final    Comment: (NOTE) The Xpert Xpress SARS-CoV-2/FLU/RSV plus assay is intended as an aid in the diagnosis of influenza from Nasopharyngeal swab specimens and should not be used as a sole basis for treatment. Nasal washings and aspirates are unacceptable for Xpert Xpress SARS-CoV-2/FLU/RSV testing.  Fact Sheet for Patients: EntrepreneurPulse.com.au  Fact Sheet for Healthcare Providers: IncredibleEmployment.be  This test is not yet approved or cleared by the  [place] FDA and has been authorized for detection and/or diagnosis of SARS-CoV-2 by FDA under an Emergency Use Authorization (EUA). This EUA will remain in effect (meaning this test can be used) for the duration of the COVID-19 declaration under Section 564(b)(1) of the Act, 21 U.S.C. section 360bbb-3(b)(1), unless the authorization is terminated or revoked.  Performed at [facility], [address] [place], [place] [address]   Resp Panel by RT-PCR (Flu A&B, Covid) Nasopharyngeal Swab     Status: None   Collection Time: 03/20/21 10:51 AM   Specimen: Nasopharyngeal Swab; Nasopharyngeal(NP) swabs in vial transport medium  Result Value Ref Range Status   SARS Coronavirus 2 by RT PCR NEGATIVE NEGATIVE Final    Comment: (NOTE) SARS-CoV-2 target nucleic acids are NOT DETECTED.  The SARS-CoV-2 RNA is generally detectable in upper respiratory specimens during the acute phase of infection. The lowest concentration of SARS-CoV-2 viral copies this assay can detect is 138 copies/mL. A negative result does not preclude SARS-Cov-2 infection and should not be used as the sole basis for treatment or other patient management decisions. A negative result may occur with  improper specimen collection/handling, submission of specimen other than nasopharyngeal swab, presence of viral mutation(s) within the areas targeted by this assay, and inadequate number of viral copies(<138 copies/mL). A negative result must be combined with clinical observations, patient history, and epidemiological information. The expected result is Negative.  Fact Sheet for Patients:  EntrepreneurPulse.com.au  Fact Sheet for Healthcare Providers:  IncredibleEmployment.be  This test is no t yet approved or cleared by the [place] FDA and  has been authorized for detection and/or diagnosis of SARS-CoV-2 by FDA under an Emergency Use Authorization (EUA). This EUA will  remain  in effect (meaning this test can be used) for the duration of the COVID-19 declaration under Section 564(b)(1) of the Act, 21 U.S.C.section 360bbb-3(b)(1), unless the authorization is terminated  or revoked sooner.       Influenza A by PCR NEGATIVE NEGATIVE Final   Influenza B by PCR NEGATIVE NEGATIVE Final    Comment: (NOTE) The Xpert Xpress SARS-CoV-2/FLU/RSV plus assay is intended as an aid in the diagnosis of influenza from Nasopharyngeal swab specimens and should not be used as a sole  basis for treatment. Nasal washings and aspirates are unacceptable for Xpert Xpress SARS-CoV-2/FLU/RSV testing.  Fact Sheet for Patients: EntrepreneurPulse.com.au  Fact Sheet for Healthcare Providers: IncredibleEmployment.be  This test is not yet approved or cleared by the [place] FDA and has been authorized for detection and/or diagnosis of SARS-CoV-2 by FDA under an Emergency Use Authorization (EUA). This EUA will remain in effect (meaning this test can be used) for the duration of the COVID-19 declaration under Section 564(b)(1) of the Act, 21 U.S.C. section 360bbb-3(b)(1), unless the authorization is terminated or revoked.  Performed at [facility], [address], [place], [place] [address]      Radiological Exams on Admission: DG Chest 2 View  Result Date: 03/20/2021 CLINICAL DATA:  Shortness of breath. EXAM: CHEST - 2 VIEW COMPARISON:  03/10/2021 FINDINGS: Coarse lung markings are similar to the prior examinations and appears to be related to chronic changes. Stable cardiomegaly. Atherosclerotic calcifications at the aortic arch. No pleural effusions. Again noted is sclerosis of the bones and compatible with chronic renal disease. IMPRESSION: 1. Stable cardiomegaly. 2. Chronic lung findings without acute changes. Electronically Signed   By: Markus Daft M.D.   On: 03/20/2021 08:14     EKG: I have personally reviewed.  Sinus  rhythm, QTC 452, low voltage, RAD, nonspecific T wave change  Assessment/Plan Principal Problem:   SOB (shortness of breath) Active Problems:   Tobacco abuse   ESRD on dialysis ([place])   Essential hypertension   Anemia in ESRD (end-stage renal disease) ([facility])   Emphysema of lung ([facility])   Fluid overload   PAF (paroxysmal atrial fibrillation) ([facility])   Chronic diastolic CHF (congestive heart failure) ([facility])   Vision loss of left eye   SOB: Patient reports worsening shortness of breath, likely multifactorial etiology, including fluid overload, COPD and chronic dCHF.  Patient has oxygen saturation 94% on home level of 2 L oxygen.  Dr. Candiss Norse of renal is consulted for dialysis  -will place in progressive bed for observation -Dialysis per renal -Bronchodilators -Nasal cannula oxygen to maintain oxygen saturation above 93%  Tobacco abuse -Nicotine patch  ESRD on dialysis ([facility]): -HD per renal   Essential hypertension: -IV hydralazine prn -Continue oral hydralazine  Anemia in ESRD (end-stage renal disease) ([place]): Hemoglobin 7.4, stable, he has hemoglobin 7.5 on 03/11/2021 -Follow-up with CBC  Emphysema of lung ([facility]) -Bronchodilators  PAF (paroxysmal atrial fibrillation) [facility]): Patient is not taking anticoagulants.  Heart rate 108, 87 -Cardiac monitor  Chronic diastolic CHF (congestive heart failure) ([place]): 2D echo on 03/02/2021 showed EF of 50% with grade 2 diastolic dysfunction.  -Volume management per renal by dialysis  Vision loss of left eye: Etiology is not clear. Need to r/o retinal artery or vein occlusion.  Symptoms have been going on for more than 1 week. -Obtain MRI brain with and without contrast  -MRI orbits with and without contrast. -Consulted ophthalmology, Dr. George Ina -Consulted Dr. Mendel Ryder of neurology -Follow-up MRI of the brain and MRI of Orbit -Start aspirin 81 mg daily      Status is: Observation  The patient remains OBS appropriate and will d/c before 2  midnights.  Dispo: The patient is from: Home              Anticipated d/c is to: Home              Patient currently is not medically stable to d/c.   Difficult to place patient No  DVT ppx: SQ Heparin         Code Status: Full code Family Communication: not done, no family member is at bed side.              Yes, patient's    at bed side Disposition Plan:  Anticipate discharge back to previous environment Consults called:   Dr. Candiss Norse of renal, Dr. George Ina of ophthalmology, Dr. Cheral Marker of urology are consulted. Admission status and Level of care: Progressive Cardiac:   for obs        Date of Service 03/20/2021    Ivor Costa [facility]   If 7PM-7AM, please contact night-coverage [email or web address] 03/20/2021, 6:53 PM

## 2021-03-20 NOTE — Progress Notes (Signed)
Post HD vitals 

## 2021-03-20 NOTE — Progress Notes (Signed)
HD start 

## 2021-03-20 NOTE — Progress Notes (Signed)
Pre HD RN assessment 

## 2021-03-20 NOTE — ED Notes (Signed)
Dialysis nurse at bedside setting up for dialysis.

## 2021-03-20 NOTE — Progress Notes (Signed)
HD end, pt signed off AMA 45 minutes early.

## 2021-03-20 NOTE — Progress Notes (Addendum)
Update 2000: pt was admitted on the floor with no signs of distress. Pt alert x 4. VSS. Pt was educated about safety and ascom within pt reach. Will continue to monitor.   Pt was screaming down the hall and on assessment pt was scratching all over body and states " im itching all over my body". Benadryl 25 mg IV was given at 1858 but pt is now complaining of itching. MD Mansy made aware. Will continue to monitor.  Update 2202: MD Mansy ordered Solu-Medrol 125 mg/38m IV injection 60 mg once and hydrocoisone cream 1 % topical 2 times daily. Will continue to monitor.  Update 0209: Pt called staff and reported that his still itching causing bleeding on buttocks, back and legs and head. Bleeding sites were cleansed and applied foam. All PRN medicines ordered for pt was administered but per pt it only helps for 2 hours then pt back on scratching again. MD Mansy made aware. Will continue to monitor.  Update 0235: MD Mansy ordered clobetasol cream (TEMOVATE) 0.05% topical twice daily PRN for severe itching and diphenhydramine (BENADYL) 25 mg in sodium chloride 0.9 % 50 ml IVPB every 4 hours PRN for itching. Will continue to monitor.

## 2021-03-21 ENCOUNTER — Observation Stay: Payer: Medicare Other

## 2021-03-21 DIAGNOSIS — R0602 Shortness of breath: Secondary | ICD-10-CM | POA: Diagnosis not present

## 2021-03-21 LAB — CBC
HCT: 23.3 % — ABNORMAL LOW (ref 39.0–52.0)
Hemoglobin: 7.4 g/dL — ABNORMAL LOW (ref 13.0–17.0)
MCH: 32 pg (ref 26.0–34.0)
MCHC: 31.8 g/dL (ref 30.0–36.0)
MCV: 100.9 fL — ABNORMAL HIGH (ref 80.0–100.0)
Platelets: 106 10*3/uL — ABNORMAL LOW (ref 150–400)
RBC: 2.31 MIL/uL — ABNORMAL LOW (ref 4.22–5.81)
RDW: 17.4 % — ABNORMAL HIGH (ref 11.5–15.5)
WBC: 6.5 10*3/uL (ref 4.0–10.5)
nRBC: 0 % (ref 0.0–0.2)

## 2021-03-21 LAB — BASIC METABOLIC PANEL
Anion gap: 18 — ABNORMAL HIGH (ref 5–15)
BUN: 68 mg/dL — ABNORMAL HIGH (ref 6–20)
CO2: 24 mmol/L (ref 22–32)
Calcium: 9.4 mg/dL (ref 8.9–10.3)
Chloride: 100 mmol/L (ref 98–111)
Creatinine, Ser: 12.87 mg/dL — ABNORMAL HIGH (ref 0.61–1.24)
GFR, Estimated: 4 mL/min — ABNORMAL LOW (ref >60)
Glucose, Bld: 134 mg/dL — ABNORMAL HIGH (ref 70–99)
Potassium: 4.5 mmol/L (ref 3.5–5.1)
Sodium: 142 mmol/L (ref 135–145)

## 2021-03-21 LAB — PHOSPHORUS: Phosphorus: 6.7 mg/dL — ABNORMAL HIGH (ref 2.5–4.6)

## 2021-03-21 IMAGING — MR MR HEAD W/O CM
10 of 14 series · 30 of 48 positions shown · non-contrast
Comparison: CT head [DATE].

CLINICAL DATA: Monocular left eye vision loss.

EXAM:
MRI HEAD AND ORBITS WITHOUT CONTRAST
TECHNIQUE: Multiplanar, multi-echo pulse sequences of the brain and surrounding
structures were acquired without intravenous contrast. Multiplanar,
multi-echo pulse sequences of the orbits and surrounding structures
were acquired including fat saturation techniques, without
intravenous contrast administration.

[Series 5: ax dwi_tracew · axial · 3.0mm · 0.65mm/px · 1 of 48 slices shown]
[im 1/48]
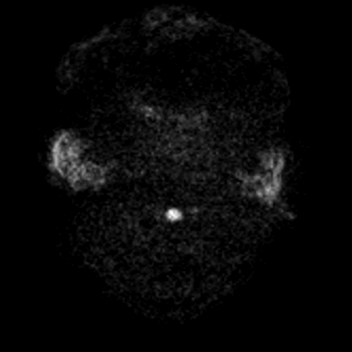

[Series 7: T1 · sagittal · 5.0mm · 0.94mm/px · 2 of 23 slices shown (1 of 4)]
[im 1/23]
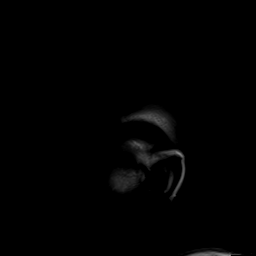
[im 23/23]
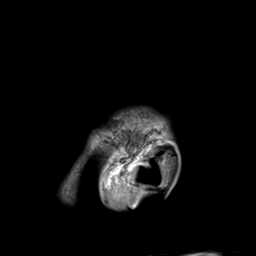

[Series 8: FLAIR · axial · 3.0mm · 0.53mm/px · z∈[-113,+48]mm · 6 of 55 slices shown]
[im 1/55]
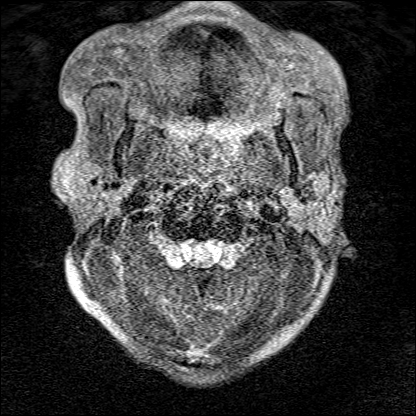
[im 11/55]
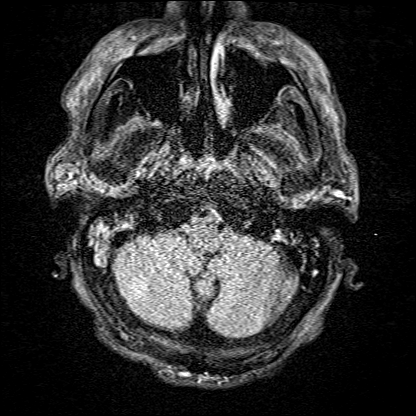
[im 22/55]
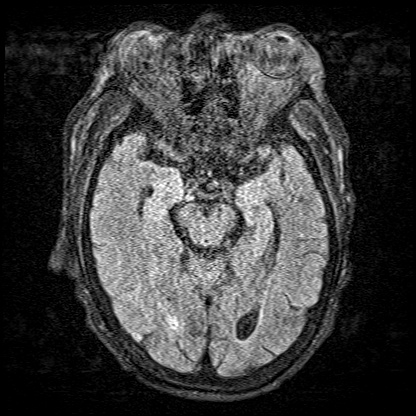
[im 33/55]
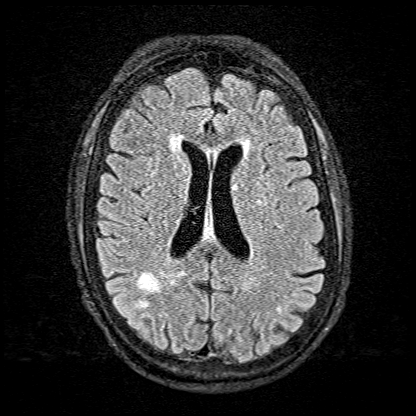
[im 44/55]
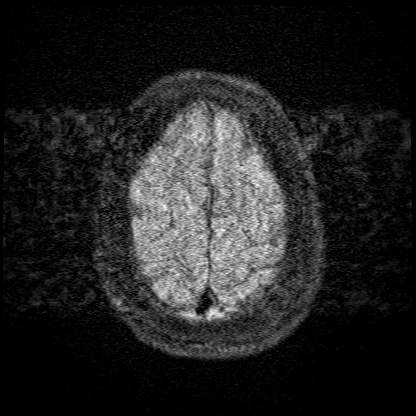
[im 55/55]
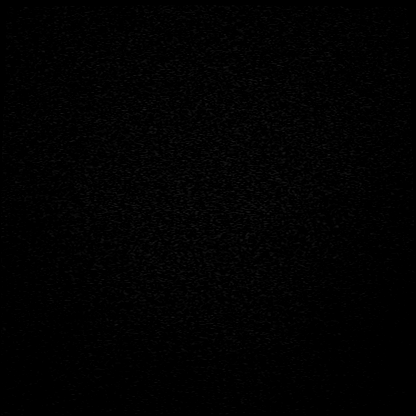

[Series 9: T1 · axial · non-contrast · 3.0mm · 0.31mm/px · z∈[-85,-20]mm · 2 of 23 slices shown (2 of 4)]
[im 1/23]
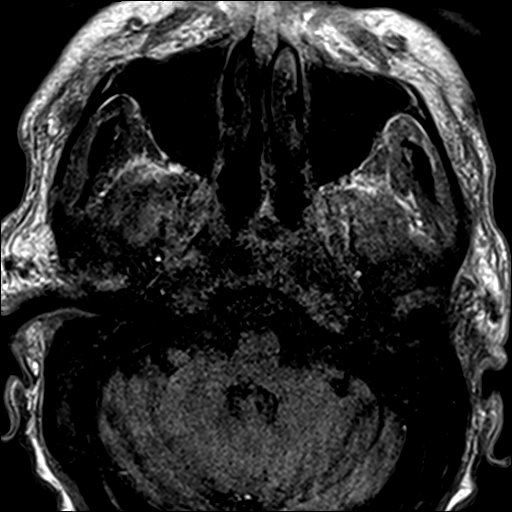
[im 23/23]
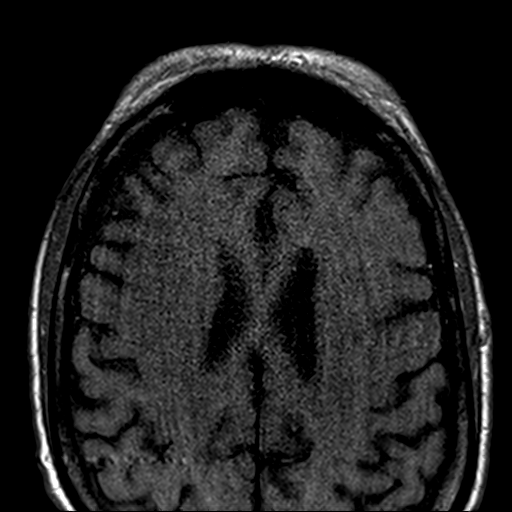

[Series 12: T1 · coronal · non-contrast · 3.0mm · 0.35mm/px · 4 of 40 slices shown (3 of 4)]
[im 1/40]
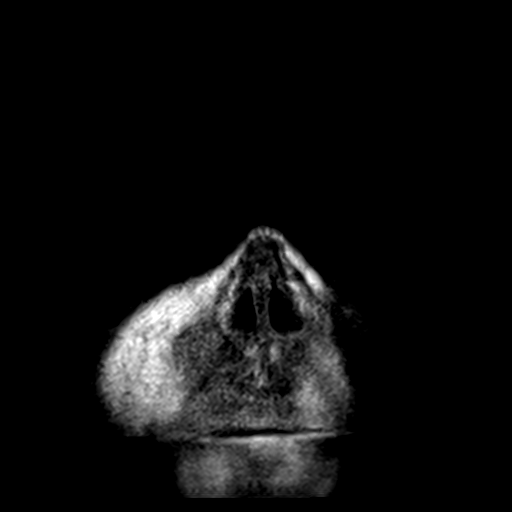
[im 14/40]
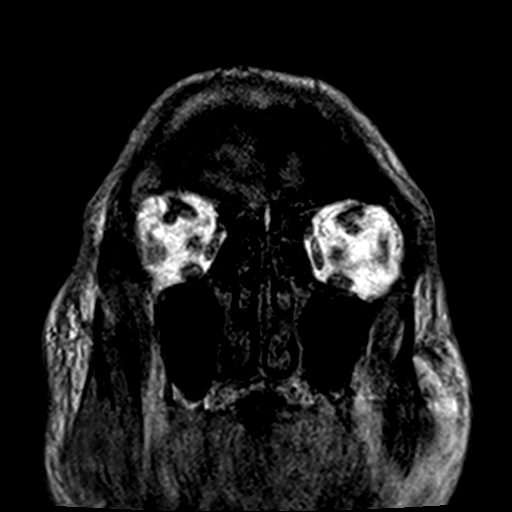
[im 27/40]
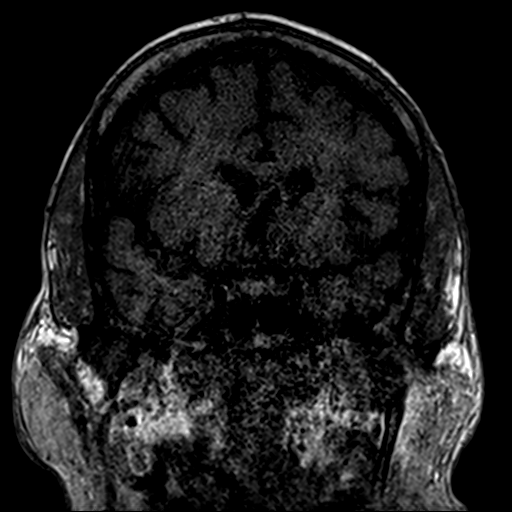
[im 40/40]
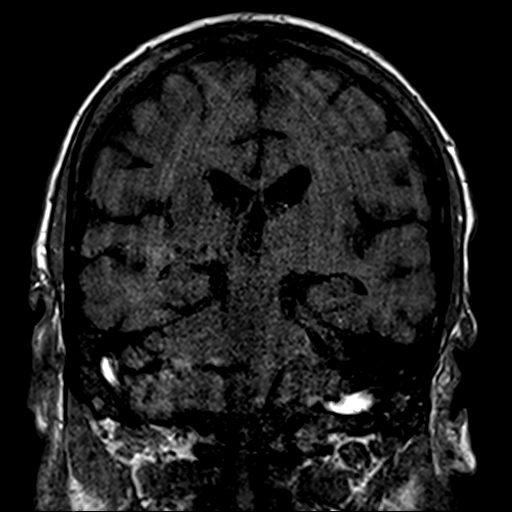

[Series 13: T2 · axial · 5.0mm · 0.45mm/px · z∈[-111,+44]mm · 3 of 27 slices shown (1 of 4)]
[im 1/27]
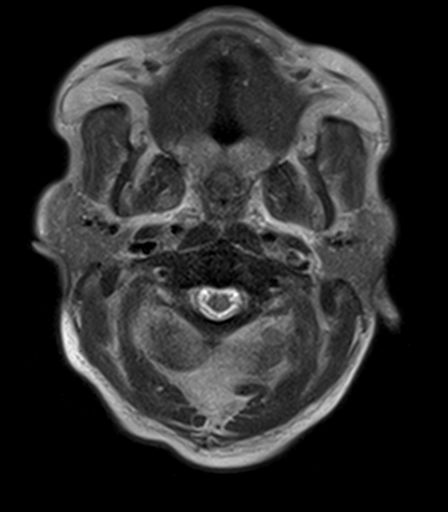
[im 14/27]
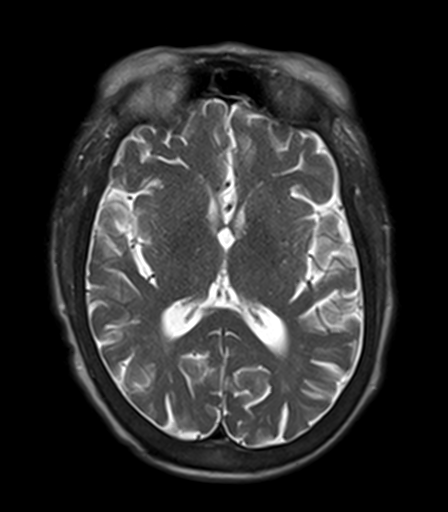
[im 27/27]
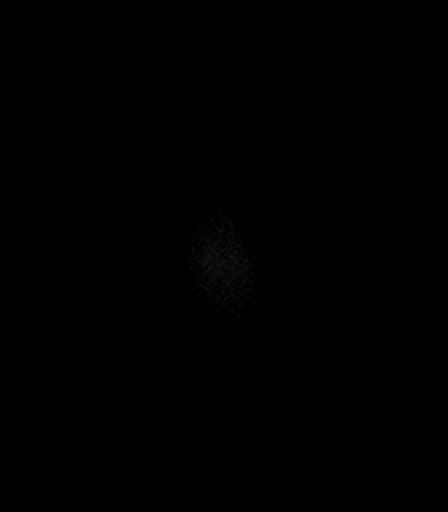

[Series 15: T1 · axial · 5.0mm · 0.90mm/px · z∈[-111,+38]mm · 3 of 26 slices shown (4 of 4)]
[im 1/26]
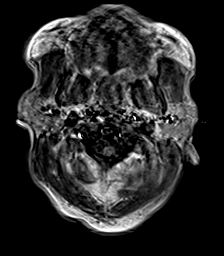
[im 13/26]
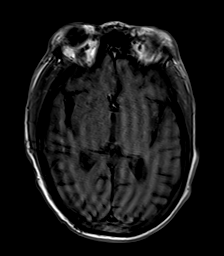
[im 26/26]
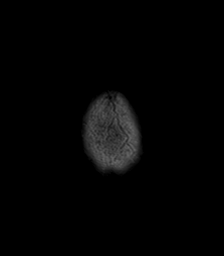

[Series 16: T2 · coronal · 5.0mm · 0.45mm/px · 3 of 31 slices shown (2 of 4)]
[im 1/31]
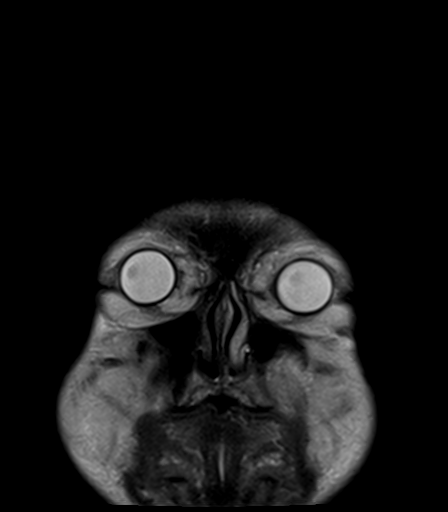
[im 16/31]
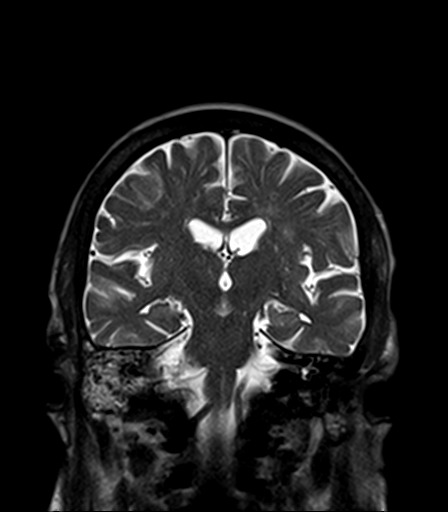
[im 31/31]
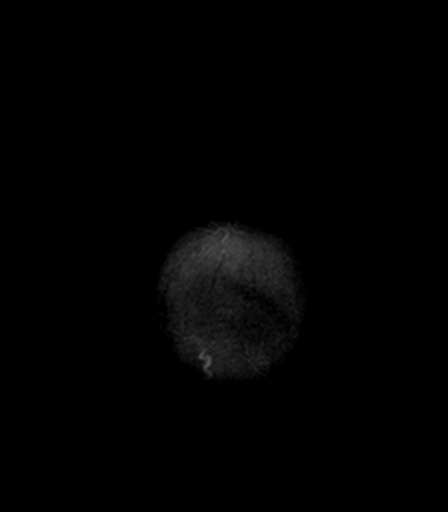

[Series 17: T2 · axial · 3.0mm · 0.78mm/px · z∈[-88,-25]mm · 2 of 17 slices shown (3 of 4)]
[im 1/17]
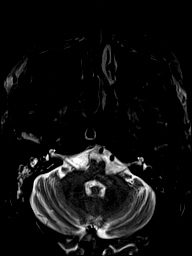
[im 17/17]
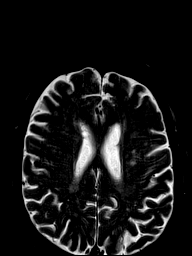

[Series 19: T2 · coronal · 3.0mm · 0.78mm/px · 4 of 35 slices shown (4 of 4)]
[im 1/35]
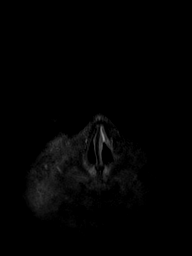
[im 12/35]
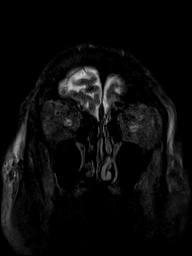
[im 23/35]
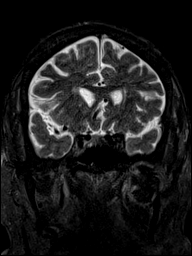
[im 35/35]
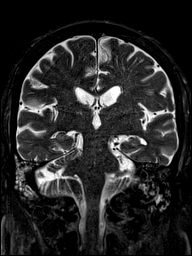

[30 of 48 positions shown; findings below may reference images not displayed]

FINDINGS: Significantly motion limited and incomplete study with patient
refusing contrast. Within this limitation:

MRI HEAD FINDINGS

Brain: No acute infarction, hemorrhage, hydrocephalus, extra-axial
collection or mass lesion. Age advanced T2/FLAIR hyperintensities in
the white matter and pons. Remote bilateral cerebellar lacunar
infarcts.

Vascular: Major arterial flow voids are maintained at the skull
base.

Skull and upper cervical spine: Diffuse T1 hypointensity of the
marrow, most likely related to the patient's known anemia.

Other: Large right and small left mastoid effusions.

MRI ORBITS FINDINGS

Orbits: Limited evaluation without obvious orbital mass or evidence
of inflammation. Grossly normal appearance of the globes, optic
nerve-sheath complexes, and lacrimal glands. Bilateral exophthalmos.
Prominent retro bulbar fat with mildly enlarged extraocular muscles
with sparing of the tendinous junction (for example see series 12,
image 24).

Visualized sinuses: Clear sinuses.

Soft tissues: Normal.
IMPRESSION: MRI Head:

1. Significantly motion limited and incomplete study with the
patient refusing contrast. Within this limitation, no evidence of
acute intracranial abnormality. No evidence of acute infarct on the
diagnostic diffusion imaging.
2. Age advanced T2/FLAIR hyperintensities within the white matter,
most likely related to chronic microvascular ischemic disease given
the patient's risk factors (including CKD and hypertension).
Demyelinating disease could have a similar appearance.
3. Remote bilateral cerebellar lacunar infarcts.
4. Large right and small left mastoid effusions.

MRI Orbits:

1. Prominent retrobulbar fat, bilateral exophthalmos, and mildly
enlarged extraocular muscles bilaterally with sparing of the
tendinous junction. Constellation of findings can be seen with
thyroid-associated orbitopathy. Recommend correlation with TSH.
2. Otherwise, no evidence of acute orbital abnormality on this
motion limited noncontrast study.

## 2021-03-21 IMAGING — MR MR ORBITS W/O CM
6 of 8 series · 29 of 48 positions shown · non-contrast
Comparison: CT head [DATE].

CLINICAL DATA: Monocular left eye vision loss.

EXAM:
MRI HEAD AND ORBITS WITHOUT CONTRAST
TECHNIQUE: Multiplanar, multi-echo pulse sequences of the brain and surrounding
structures were acquired without intravenous contrast. Multiplanar,
multi-echo pulse sequences of the orbits and surrounding structures
were acquired including fat saturation techniques, without
intravenous contrast administration.

[Series 9: T1 · axial · non-contrast · 3.0mm · 0.31mm/px · z∈[-85,-20]mm · 4 of 23 slices shown (1 of 2)]
[im 1/23]
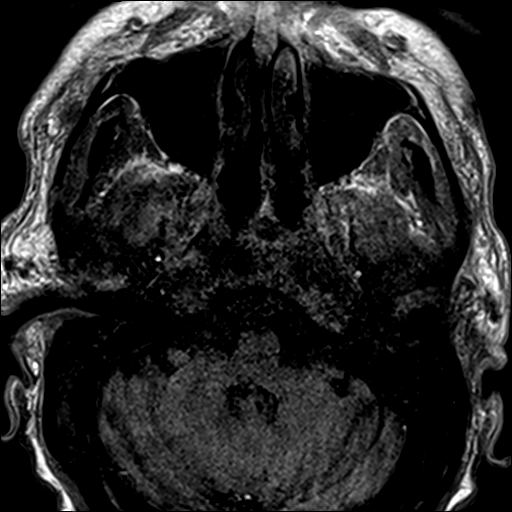
[im 8/23]
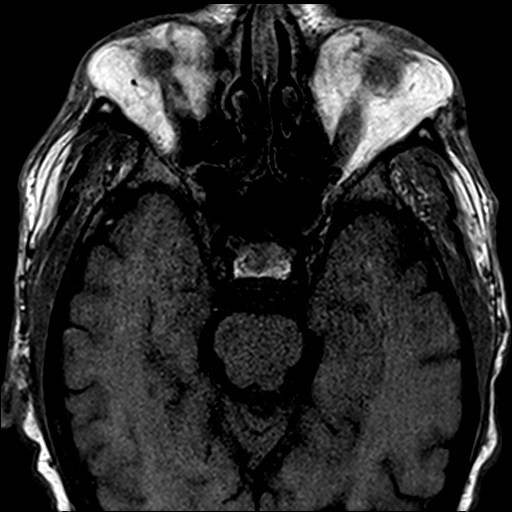
[im 15/23]
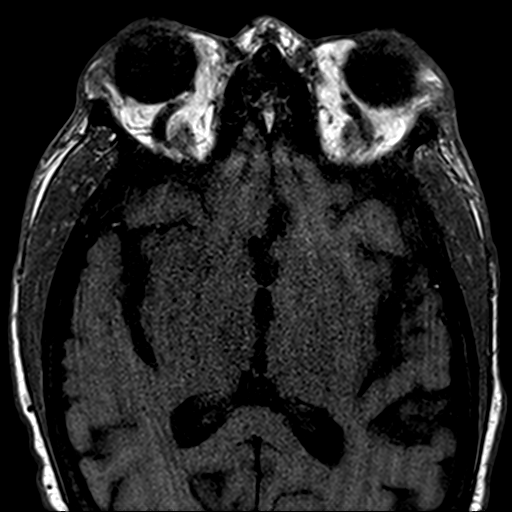
[im 23/23]
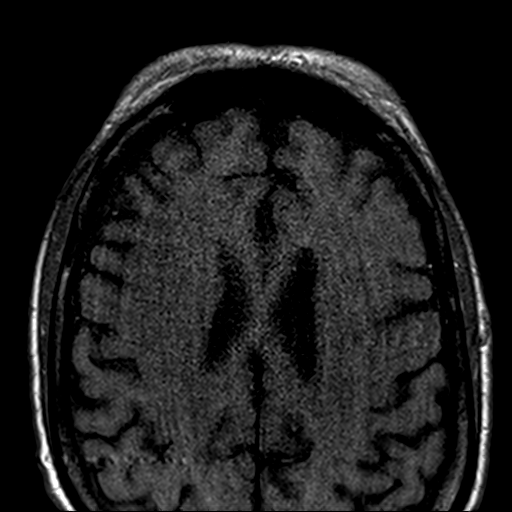

[Series 12: T1 · coronal · non-contrast · 3.0mm · 0.35mm/px · 1 of 40 slices shown (2 of 2)]
[im 1/40]
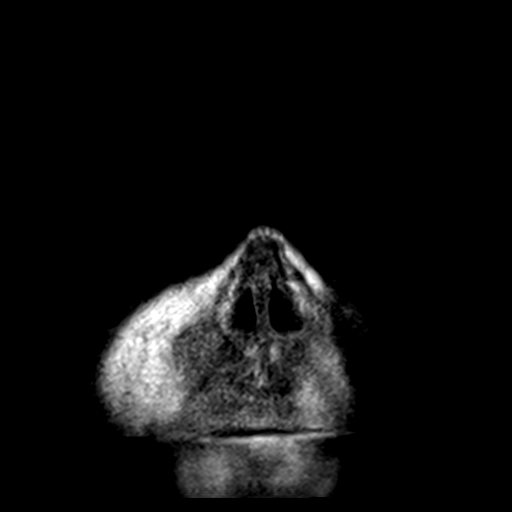

[Series 13: T2 · axial · 5.0mm · 0.45mm/px · z∈[-111,+44]mm · 6 of 27 slices shown (1 of 4)]
[im 1/27]
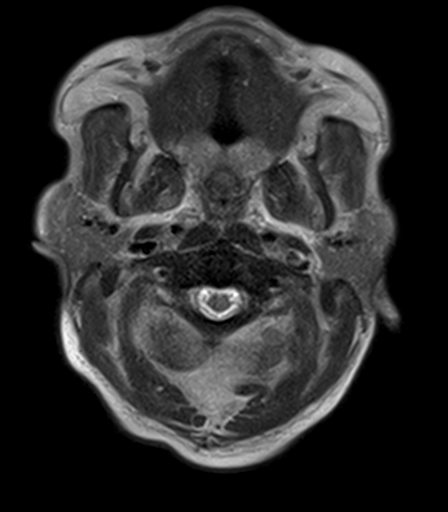
[im 6/27]
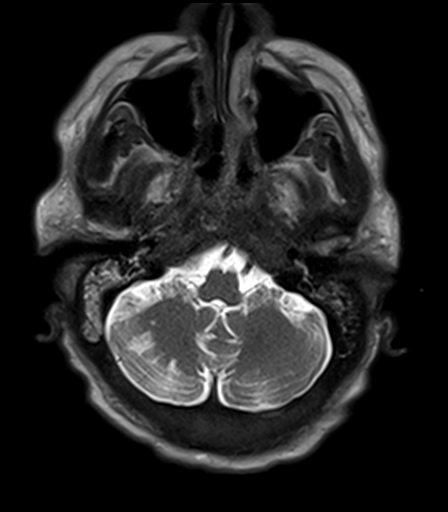
[im 11/27]
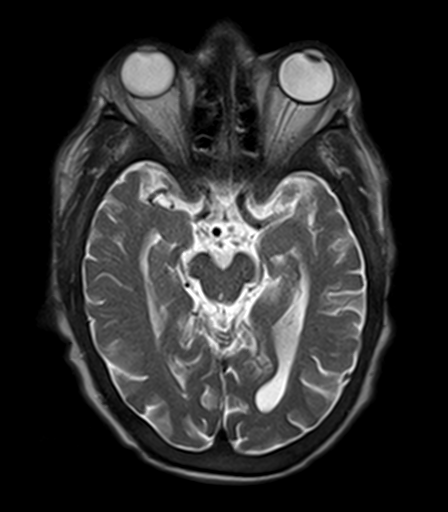
[im 16/27]
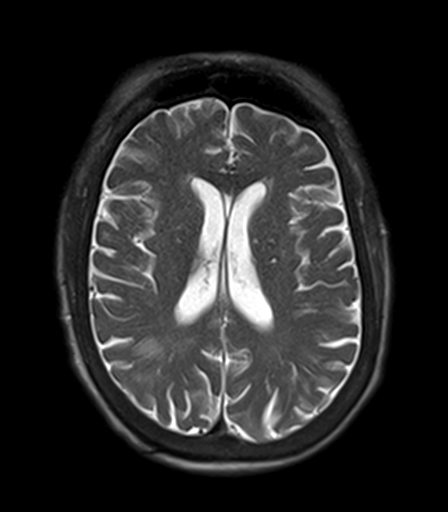
[im 21/27]
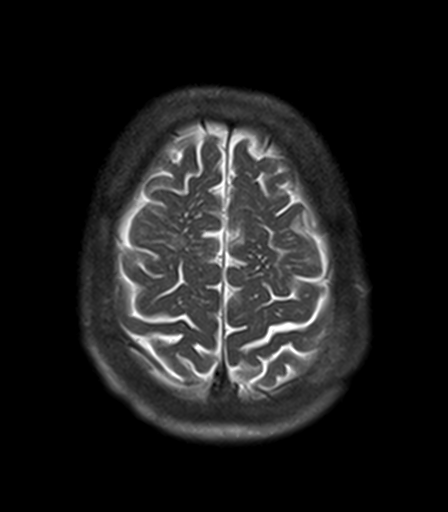
[im 27/27]
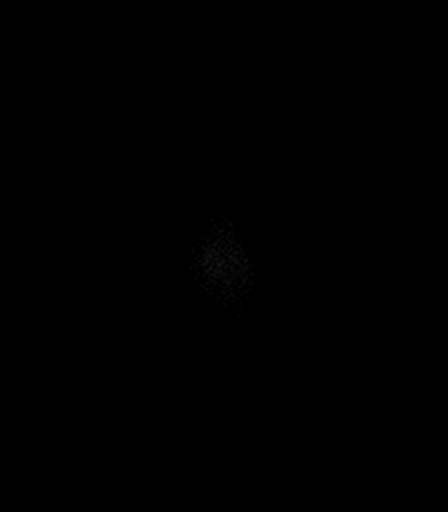

[Series 16: T2 · coronal · 5.0mm · 0.45mm/px · 7 of 31 slices shown (2 of 4)]
[im 1/31]
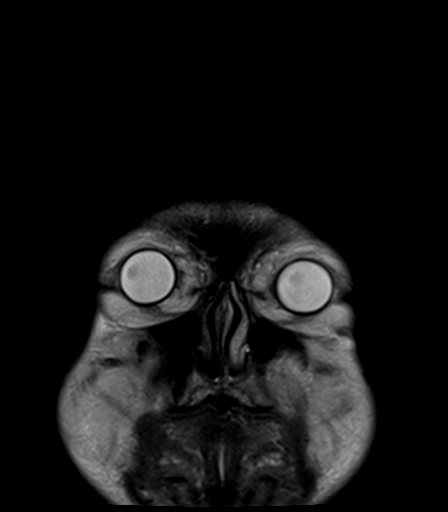
[im 6/31]
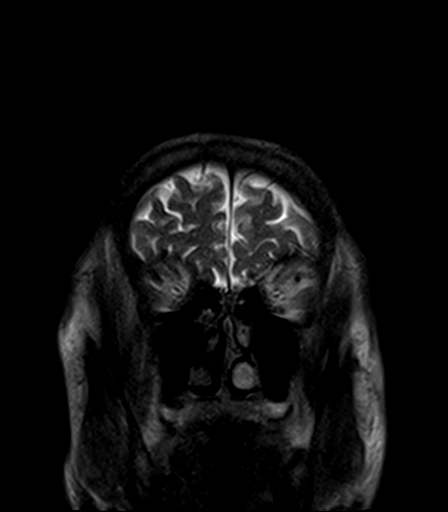
[im 11/31]
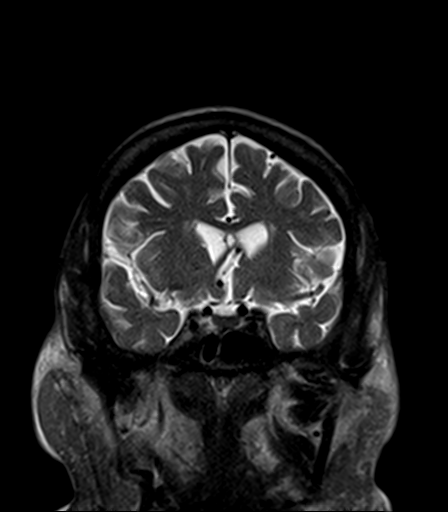
[im 16/31]
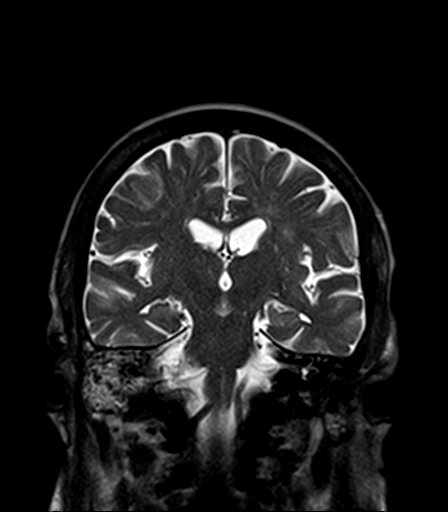
[im 21/31]
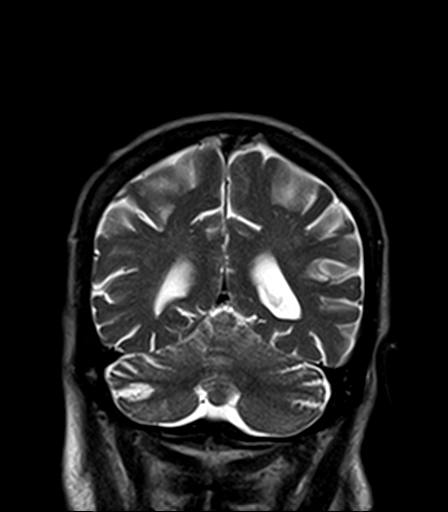
[im 26/31]
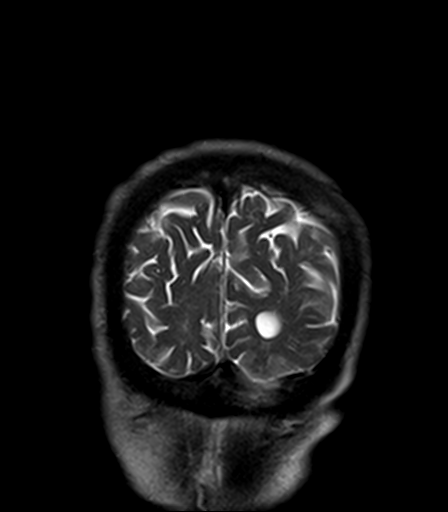
[im 31/31]
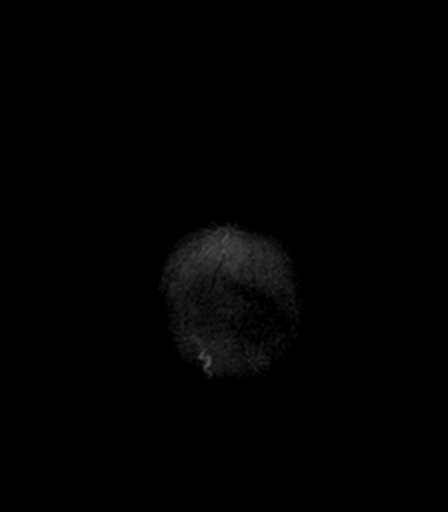

[Series 17: T2 · axial · 3.0mm · 0.78mm/px · z∈[-88,-25]mm · 4 of 17 slices shown (3 of 4)]
[im 1/17]
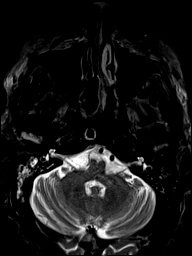
[im 6/17]
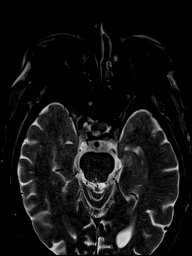
[im 11/17]
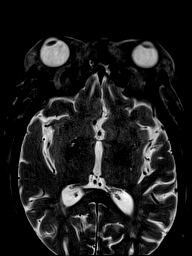
[im 17/17]
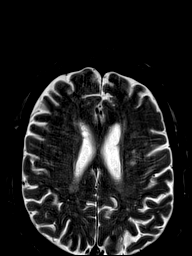

[Series 19: T2 · coronal · 3.0mm · 0.78mm/px · 7 of 35 slices shown (4 of 4)]
[im 1/35]
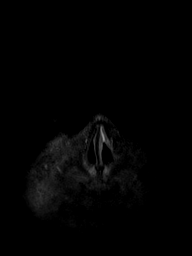
[im 6/35]
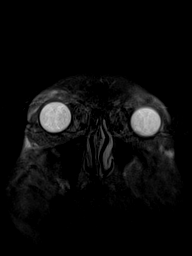
[im 12/35]
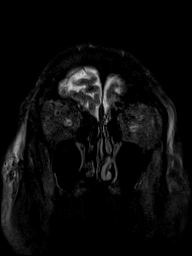
[im 18/35]
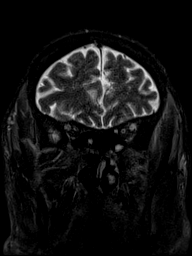
[im 23/35]
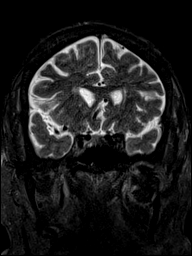
[im 29/35]
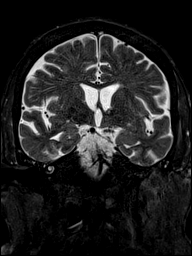
[im 35/35]
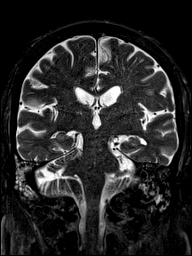

[29 of 48 positions shown; findings below may reference images not displayed]

FINDINGS: Significantly motion limited and incomplete study with patient
refusing contrast. Within this limitation:

MRI HEAD FINDINGS

Brain: No acute infarction, hemorrhage, hydrocephalus, extra-axial
collection or mass lesion. Age advanced T2/FLAIR hyperintensities in
the white matter and pons. Remote bilateral cerebellar lacunar
infarcts.

Vascular: Major arterial flow voids are maintained at the skull
base.

Skull and upper cervical spine: Diffuse T1 hypointensity of the
marrow, most likely related to the patient's known anemia.

Other: Large right and small left mastoid effusions.

MRI ORBITS FINDINGS

Orbits: Limited evaluation without obvious orbital mass or evidence
of inflammation. Grossly normal appearance of the globes, optic
nerve-sheath complexes, and lacrimal glands. Bilateral exophthalmos.
Prominent retro bulbar fat with mildly enlarged extraocular muscles
with sparing of the tendinous junction (for example see series 12,
image 24).

Visualized sinuses: Clear sinuses.

Soft tissues: Normal.
IMPRESSION: MRI Head:

1. Significantly motion limited and incomplete study with the
patient refusing contrast. Within this limitation, no evidence of
acute intracranial abnormality. No evidence of acute infarct on the
diagnostic diffusion imaging.
2. Age advanced T2/FLAIR hyperintensities within the white matter,
most likely related to chronic microvascular ischemic disease given
the patient's risk factors (including CKD and hypertension).
Demyelinating disease could have a similar appearance.
3. Remote bilateral cerebellar lacunar infarcts.
4. Large right and small left mastoid effusions.

MRI Orbits:

1. Prominent retrobulbar fat, bilateral exophthalmos, and mildly
enlarged extraocular muscles bilaterally with sparing of the
tendinous junction. Constellation of findings can be seen with
thyroid-associated orbitopathy. Recommend correlation with TSH.
2. Otherwise, no evidence of acute orbital abnormality on this
motion limited noncontrast study.

## 2021-03-21 MED ORDER — SODIUM CHLORIDE 0.9 % IV SOLN
25.0000 mg | INTRAVENOUS | Status: DC | PRN
  Filled 2021-03-21: qty 0.5

## 2021-03-21 MED ORDER — GADOBUTROL 1 MMOL/ML IV SOLN: 7.5000 mL | Freq: Once | INTRAVENOUS | Status: DC | PRN

## 2021-03-21 MED ORDER — CLOBETASOL PROPIONATE 0.05 % EX CREA
TOPICAL_CREAM | Freq: Two times a day (BID) | CUTANEOUS | Status: DC | PRN
  Filled 2021-03-21: qty 15

## 2021-03-21 NOTE — Discharge Summary (Signed)
Patient signed out [place] before I could see him.

## 2021-03-21 NOTE — Progress Notes (Signed)
Patient signed out AMA. Patient educated on what all AMA entails. Attending physician notified verbally.

## 2021-03-21 NOTE — Progress Notes (Signed)
[facility]  ROUNDING NOTE   Subjective:   Jordan [name] is a 55 y.o. male with medical concerns including hypertension, anemia, tobacco and alcohol abuse, and ESRD on dialysis. He presents to the ED complaining of shortness of breath and left eye vision loss.   We have been consulted to manage dialysis care. He receives dialysis at [facility] followed by [facility]. He received a partial treatment on Monday. He states he itches so bad that he he can't sit for his full treatment. This has been a concern for years. Patients states his shortness of breath was getting worse, but with progressive vision loss in left eye for over a week, he decided to come to ED.   Patient seen resting in bed  Alert and oriented Received dialysis yesterday, tolerated well HD terminated early at patient request Continues to complain of itching Tolerated meals and denies shortness of breath Continues to complains of limited vision in left eye    Objective:  Vital signs in last 24 hours:  Temp:  [97.7 F (36.5 C)-98.4 F (36.9 C)] 97.7 F (36.5 C) (06/30 0835) Pulse Rate:  [101-111] 108 (06/30 0835) Resp:  [18-27] 18 (06/30 0325) BP: (127-163)/(65-116) 142/97 (06/30 0835) SpO2:  [94 %-100 %] 99 % (06/30 0835) Weight:  [75.6 kg-76.1 kg] 76.1 kg (06/30 0153)  Weight change:  Filed Weights   03/20/21 0736 03/20/21 1902 03/21/21 0153  Weight: 71.7 kg 75.6 kg 76.1 kg    Intake/Output: I/O last 3 completed shifts: In: 30 [P.O.:420] Out: 1909 [Other:1909]   Intake/Output this shift:  Total I/O In: 120 [P.O.:120] Out: -   Physical Exam: General: NAD, laying on stretcher  Head: Normocephalic, atraumatic. Moist oral mucosal membranes  Eyes: Anicteric, left eye periorbital edema  Lungs:  Clear to auscultation, normal breathing effort  Heart: Regular rate and rhythm  Abdomen:  Soft, nontender,   Extremities:  2+ peripheral edema.  Neurologic: Nonfocal, moving all four  extremities  Skin: No lesions  Access: Lt AVF    Basic Metabolic Panel: Recent Labs  Lab 03/20/21 0740 03/21/21 0553  NA 146* 142  K 4.8 4.5  CL 105 100  CO2 19* 24  GLUCOSE 124* 134*  BUN 96* 68*  CREATININE 18.44* 12.87*  CALCIUM 9.5 9.4     Liver Function Tests: No results for input(s): AST, ALT, ALKPHOS, BILITOT, PROT, ALBUMIN in the last 168 hours. No results for input(s): LIPASE, AMYLASE in the last 168 hours. No results for input(s): AMMONIA in the last 168 hours.  CBC: Recent Labs  Lab 03/20/21 0740 03/21/21 0553  WBC 7.3 6.5  HGB 7.4* 7.4*  HCT 22.8* 23.3*  MCV 100.4* 100.9*  PLT 123* 106*     Cardiac Enzymes: No results for input(s): CKTOTAL, CKMB, CKMBINDEX, TROPONINI in the last 168 hours.  BNP: Invalid input(s): POCBNP  CBG: No results for input(s): GLUCAP in the last 168 hours.  Microbiology: Results for orders placed or performed during the hospital encounter of 03/20/21  Resp Panel by RT-PCR (Flu A&B, Covid) Nasopharyngeal Swab     Status: None   Collection Time: 03/20/21 10:51 AM   Specimen: Nasopharyngeal Swab; Nasopharyngeal(NP) swabs in vial transport medium  Result Value Ref Range Status   SARS Coronavirus 2 by RT PCR NEGATIVE NEGATIVE Final    Comment: (NOTE) SARS-CoV-2 target nucleic acids are NOT DETECTED.  The SARS-CoV-2 RNA is generally detectable in upper respiratory specimens during the acute phase of infection. The lowest concentration  of SARS-CoV-2 viral copies this assay can detect is 138 copies/mL. A negative result does not preclude SARS-Cov-2 infection and should not be used as the sole basis for treatment or other patient management decisions. A negative result may occur with  improper specimen collection/handling, submission of specimen other than nasopharyngeal swab, presence of viral mutation(s) within the areas targeted by this assay, and inadequate number of viral copies(<138 copies/mL). A negative result must  be combined with clinical observations, patient history, and epidemiological information. The expected result is Negative.  Fact Sheet for Patients:  EntrepreneurPulse.com.au  Fact Sheet for Healthcare Providers:  IncredibleEmployment.be  This test is no t yet approved or cleared by the [place] FDA and  has been authorized for detection and/or diagnosis of SARS-CoV-2 by FDA under an Emergency Use Authorization (EUA). This EUA will remain  in effect (meaning this test can be used) for the duration of the COVID-19 declaration under Section 564(b)(1) of the Act, 21 U.S.C.section 360bbb-3(b)(1), unless the authorization is terminated  or revoked sooner.       Influenza A by PCR NEGATIVE NEGATIVE Final   Influenza B by PCR NEGATIVE NEGATIVE Final    Comment: (NOTE) The Xpert Xpress SARS-CoV-2/FLU/RSV plus assay is intended as an aid in the diagnosis of influenza from Nasopharyngeal swab specimens and should not be used as a sole basis for treatment. Nasal washings and aspirates are unacceptable for Xpert Xpress SARS-CoV-2/FLU/RSV testing.  Fact Sheet for Patients: EntrepreneurPulse.com.au  Fact Sheet for Healthcare Providers: IncredibleEmployment.be  This test is not yet approved or cleared by the [place] FDA and has been authorized for detection and/or diagnosis of SARS-CoV-2 by FDA under an Emergency Use Authorization (EUA). This EUA will remain in effect (meaning this test can be used) for the duration of the COVID-19 declaration under Section 564(b)(1) of the Act, 21 U.S.C. section 360bbb-3(b)(1), unless the authorization is terminated or revoked.  Performed at [facility], [address] [place], [place] [address]     Coagulation Studies: No results for input(s): LABPROT, INR in the last 72 hours.  Urinalysis: No results for input(s): COLORURINE, LABSPEC, PHURINE,  GLUCOSEU, HGBUR, BILIRUBINUR, KETONESUR, PROTEINUR, UROBILINOGEN, NITRITE, LEUKOCYTESUR in the last 72 hours.  Invalid input(s): APPERANCEUR    Imaging: DG Chest 2 View  Result Date: 03/20/2021 CLINICAL DATA:  Shortness of breath. EXAM: CHEST - 2 VIEW COMPARISON:  03/10/2021 FINDINGS: Coarse lung markings are similar to the prior examinations and appears to be related to chronic changes. Stable cardiomegaly. Atherosclerotic calcifications at the aortic arch. No pleural effusions. Again noted is sclerosis of the bones and compatible with chronic renal disease. IMPRESSION: 1. Stable cardiomegaly. 2. Chronic lung findings without acute changes. Electronically Signed   By: Markus Daft M.D.   On: 03/20/2021 08:14   MR BRAIN WO CONTRAST  Result Date: 03/21/2021 CLINICAL DATA:  Monocular left eye vision loss. EXAM: MRI HEAD AND ORBITS WITHOUT CONTRAST TECHNIQUE: Multiplanar, multi-echo pulse sequences of the brain and surrounding structures were acquired without intravenous contrast. Multiplanar, multi-echo pulse sequences of the orbits and surrounding structures were acquired including fat saturation techniques, without intravenous contrast administration. COMPARISON:  CT head March 11, 2021. FINDINGS: Significantly motion limited and incomplete study with patient refusing contrast. Within this limitation: MRI HEAD FINDINGS Brain: No acute infarction, hemorrhage, hydrocephalus, extra-axial collection or mass lesion. Age advanced T2/FLAIR hyperintensities in the white matter and pons. Remote bilateral cerebellar lacunar infarcts. Vascular: Major arterial flow voids are maintained at the skull base. Skull  and upper cervical spine: Diffuse T1 hypointensity of the marrow, most likely related to the patient's known anemia. Other: Large right and small left mastoid effusions. MRI ORBITS FINDINGS Orbits: Limited evaluation without obvious orbital mass or evidence of inflammation. Grossly normal appearance of the  globes, optic nerve-sheath complexes, and lacrimal glands. Bilateral exophthalmos. Prominent retro bulbar fat with mildly enlarged extraocular muscles with sparing of the tendinous junction (for example see series 12, image 24). Visualized sinuses: Clear sinuses. Soft tissues: Normal. IMPRESSION: MRI Head: 1. Significantly motion limited and incomplete study with the patient refusing contrast. Within this limitation, no evidence of acute intracranial abnormality. No evidence of acute infarct on the diagnostic diffusion imaging. 2. Age advanced T2/FLAIR hyperintensities within the white matter, most likely related to chronic microvascular ischemic disease given the patient's risk factors (including CKD and hypertension). Demyelinating disease could have a similar appearance. 3. Remote bilateral cerebellar lacunar infarcts. 4. Large right and small left mastoid effusions. MRI Orbits: 1. Prominent retrobulbar fat, bilateral exophthalmos, and mildly enlarged extraocular muscles bilaterally with sparing of the tendinous junction. Constellation of findings can be seen with thyroid-associated orbitopathy. Recommend correlation with TSH. 2. Otherwise, no evidence of acute orbital abnormality on this motion limited noncontrast study. Electronically Signed   By: Margaretha Sheffield MD   On: 03/21/2021 12:23   MR ORBITS WO CONTRAST  Result Date: 03/21/2021 CLINICAL DATA:  Monocular left eye vision loss. EXAM: MRI HEAD AND ORBITS WITHOUT CONTRAST TECHNIQUE: Multiplanar, multi-echo pulse sequences of the brain and surrounding structures were acquired without intravenous contrast. Multiplanar, multi-echo pulse sequences of the orbits and surrounding structures were acquired including fat saturation techniques, without intravenous contrast administration. COMPARISON:  CT head March 11, 2021. FINDINGS: Significantly motion limited and incomplete study with patient refusing contrast. Within this limitation: MRI HEAD FINDINGS Brain:  No acute infarction, hemorrhage, hydrocephalus, extra-axial collection or mass lesion. Age advanced T2/FLAIR hyperintensities in the white matter and pons. Remote bilateral cerebellar lacunar infarcts. Vascular: Major arterial flow voids are maintained at the skull base. Skull and upper cervical spine: Diffuse T1 hypointensity of the marrow, most likely related to the patient's known anemia. Other: Large right and small left mastoid effusions. MRI ORBITS FINDINGS Orbits: Limited evaluation without obvious orbital mass or evidence of inflammation. Grossly normal appearance of the globes, optic nerve-sheath complexes, and lacrimal glands. Bilateral exophthalmos. Prominent retro bulbar fat with mildly enlarged extraocular muscles with sparing of the tendinous junction (for example see series 12, image 24). Visualized sinuses: Clear sinuses. Soft tissues: Normal. IMPRESSION: MRI Head: 1. Significantly motion limited and incomplete study with the patient refusing contrast. Within this limitation, no evidence of acute intracranial abnormality. No evidence of acute infarct on the diagnostic diffusion imaging. 2. Age advanced T2/FLAIR hyperintensities within the white matter, most likely related to chronic microvascular ischemic disease given the patient's risk factors (including CKD and hypertension). Demyelinating disease could have a similar appearance. 3. Remote bilateral cerebellar lacunar infarcts. 4. Large right and small left mastoid effusions. MRI Orbits: 1. Prominent retrobulbar fat, bilateral exophthalmos, and mildly enlarged extraocular muscles bilaterally with sparing of the tendinous junction. Constellation of findings can be seen with thyroid-associated orbitopathy. Recommend correlation with TSH. 2. Otherwise, no evidence of acute orbital abnormality on this motion limited noncontrast study. Electronically Signed   By: Margaretha Sheffield MD   On: 03/21/2021 12:23     Medications:    sodium chloride      sodium chloride     diphenhydrAMINE  aspirin EC  81 mg Oral Daily   calcium acetate  1,334 mg Oral TID WC   Chlorhexidine Gluconate Cloth  6 each Topical Q0600   cinacalcet  60 mg Oral Q breakfast   dextromethorphan-guaiFENesin  1 tablet Oral BID   [START ON 03/22/2021] epoetin (EPOGEN/PROCRIT) injection  10,000 Units Subcutaneous Q M,W,F-HD   [START ON 03/22/2021] ferric citrate  420 mg Oral Q M,W,F-HD   ferric citrate  420 mg Oral TID WC   heparin  5,000 Units Subcutaneous Q8H   hydrALAZINE  50 mg Oral Q8H   hydrocortisone cream   Topical BID   ipratropium-albuterol  3 mL Nebulization Q6H   nicotine  21 mg Transdermal Daily   sucroferric oxyhydroxide  500 mg Oral TID WC   thiamine  100 mg Oral Daily   sodium chloride, sodium chloride, acetaminophen, albuterol, alteplase, clobetasol cream, diphenhydrAMINE, gadobutrol, heparin, hydrALAZINE, hydrOXYzine, lidocaine (PF), lidocaine, lidocaine-prilocaine, ondansetron (ZOFRAN) IV, pentafluoroprop-tetrafluoroeth  Assessment/ Plan:  [name] is a 55 y.o.  male with medical concerns including hypertension, anemia, tobacco and alcohol abuse, and ESRD on dialysis. He presents to the ED complaining of shortness of breath and left eye vision loss.   [facility] [address]/MWF/Lt AVF  Shortness of breath  -Weaned to room air -concern about volume overload -Received dialysis yesterday -denies shortness of breath  2. End stage renal disease on dialysis Will maintain outpatient schedule while inpatient Received partial treatment on Monday Received dialysis yesterday, signed off machine early UF 1.9L achieved Next treatment scheduled for Friday Or sooner based on respiratory status  3. Anemia of chronic kidney disease   Lab Results  Component Value Date   HGB 7.4 (L) 03/21/2021   Hgb below target EPO 10000 units with treatments  4. Secondary Hyperparathyroidism:   Lab Results  Component Value Date   PTH 669 (H)  02/20/2020   CALCIUM 9.4 03/21/2021   CAION 1.26 04/07/2020   PHOS 10.0 (H) 03/11/2021   Phosphorus elevated, will order Auryxia ordered with meals Will consider phoslo if phos remains high   LOS: 0   6/30/202212:33 PM

## 2021-03-21 NOTE — Consult Note (Signed)
Subjective: Pt. Attempting to leave AMA. Pulled his IV out. Once the RN established hemostasis, the pt. Confirmed that he would stay for an eye exam. States that he lost vision OS 1 or more weeks ago upon waking. No pain.  Objective: Vital signs in last 24 hours: Temp:  [97.7 F (36.5 C)-98.4 F (36.9 C)] 97.7 F (36.5 C) (06/30 0835) Pulse Rate:  [101-111] 108 (06/30 0835) Resp:  [18-27] 18 (06/30 0325) BP: (127-163)/(65-116) 142/97 (06/30 0835) SpO2:  [94 %-100 %] 99 % (06/30 0835) Weight:  [75.6 kg-76.1 kg] 76.1 kg (06/30 0153)    Objective: BCVA 20/25 OD   HM OS                 Miotic pupils. APD not observed but pt states the exam light is dim in the left eye.               IOP 19 mmHg    15 mmHg     Mild to Mod. Exophthalmos.     No dysmotility.  Anterior segment NORMAL OU  Dilated Fundus exam: Normal OD           Diffuse retinal pallor OS with foveal " cherry red spot" .  CRAO.        Recent Labs    03/20/21 0740 03/21/21 0553  WBC 7.3 6.5  HGB 7.4* 7.4*  HCT 22.8* 23.3*  NA 146* 142  K 4.8 4.5  CL 105 100  CO2 19* 24  BUN 96* 68*  CREATININE 18.44* 12.87*    Studies/Results: DG Chest 2 View  Result Date: 03/20/2021 CLINICAL DATA:  Shortness of breath. EXAM: CHEST - 2 VIEW COMPARISON:  03/10/2021 FINDINGS: Coarse lung markings are similar to the prior examinations and appears to be related to chronic changes. Stable cardiomegaly. Atherosclerotic calcifications at the aortic arch. No pleural effusions. Again noted is sclerosis of the bones and compatible with chronic renal disease. IMPRESSION: 1. Stable cardiomegaly. 2. Chronic lung findings without acute changes. Electronically Signed   By: Markus Daft M.D.   On: 03/20/2021 08:14   MR BRAIN WO CONTRAST  Result Date: 03/21/2021 CLINICAL DATA:  Monocular left eye vision loss. EXAM: MRI HEAD AND ORBITS WITHOUT CONTRAST TECHNIQUE: Multiplanar, multi-echo pulse sequences of the brain and surrounding structures  were acquired without intravenous contrast. Multiplanar, multi-echo pulse sequences of the orbits and surrounding structures were acquired including fat saturation techniques, without intravenous contrast administration. COMPARISON:  CT head March 11, 2021. FINDINGS: Significantly motion limited and incomplete study with patient refusing contrast. Within this limitation: MRI HEAD FINDINGS Brain: No acute infarction, hemorrhage, hydrocephalus, extra-axial collection or mass lesion. Age advanced T2/FLAIR hyperintensities in the white matter and pons. Remote bilateral cerebellar lacunar infarcts. Vascular: Major arterial flow voids are maintained at the skull base. Skull and upper cervical spine: Diffuse T1 hypointensity of the marrow, most likely related to the patient's known anemia. Other: Large right and small left mastoid effusions. MRI ORBITS FINDINGS Orbits: Limited evaluation without obvious orbital mass or evidence of inflammation. Grossly normal appearance of the globes, optic nerve-sheath complexes, and lacrimal glands. Bilateral exophthalmos. Prominent retro bulbar fat with mildly enlarged extraocular muscles with sparing of the tendinous junction (for example see series 12, image 24). Visualized sinuses: Clear sinuses. Soft tissues: Normal. IMPRESSION: MRI Head: 1. Significantly motion limited and incomplete study with the patient refusing contrast. Within this limitation, no evidence of acute intracranial abnormality. No evidence of acute infarct on the diagnostic diffusion imaging.  2. Age advanced T2/FLAIR hyperintensities within the white matter, most likely related to chronic microvascular ischemic disease given the patient's risk factors (including CKD and hypertension). Demyelinating disease could have a similar appearance. 3. Remote bilateral cerebellar lacunar infarcts. 4. Large right and small left mastoid effusions. MRI Orbits: 1. Prominent retrobulbar fat, bilateral exophthalmos, and mildly  enlarged extraocular muscles bilaterally with sparing of the tendinous junction. Constellation of findings can be seen with thyroid-associated orbitopathy. Recommend correlation with TSH. 2. Otherwise, no evidence of acute orbital abnormality on this motion limited noncontrast study. Electronically Signed   By: Margaretha Sheffield MD   On: 03/21/2021 12:23   MR ORBITS WO CONTRAST  Result Date: 03/21/2021 CLINICAL DATA:  Monocular left eye vision loss. EXAM: MRI HEAD AND ORBITS WITHOUT CONTRAST TECHNIQUE: Multiplanar, multi-echo pulse sequences of the brain and surrounding structures were acquired without intravenous contrast. Multiplanar, multi-echo pulse sequences of the orbits and surrounding structures were acquired including fat saturation techniques, without intravenous contrast administration. COMPARISON:  CT head March 11, 2021. FINDINGS: Significantly motion limited and incomplete study with patient refusing contrast. Within this limitation: MRI HEAD FINDINGS Brain: No acute infarction, hemorrhage, hydrocephalus, extra-axial collection or mass lesion. Age advanced T2/FLAIR hyperintensities in the white matter and pons. Remote bilateral cerebellar lacunar infarcts. Vascular: Major arterial flow voids are maintained at the skull base. Skull and upper cervical spine: Diffuse T1 hypointensity of the marrow, most likely related to the patient's known anemia. Other: Large right and small left mastoid effusions. MRI ORBITS FINDINGS Orbits: Limited evaluation without obvious orbital mass or evidence of inflammation. Grossly normal appearance of the globes, optic nerve-sheath complexes, and lacrimal glands. Bilateral exophthalmos. Prominent retro bulbar fat with mildly enlarged extraocular muscles with sparing of the tendinous junction (for example see series 12, image 24). Visualized sinuses: Clear sinuses. Soft tissues: Normal. IMPRESSION: MRI Head: 1. Significantly motion limited and incomplete study with the  patient refusing contrast. Within this limitation, no evidence of acute intracranial abnormality. No evidence of acute infarct on the diagnostic diffusion imaging. 2. Age advanced T2/FLAIR hyperintensities within the white matter, most likely related to chronic microvascular ischemic disease given the patient's risk factors (including CKD and hypertension). Demyelinating disease could have a similar appearance. 3. Remote bilateral cerebellar lacunar infarcts. 4. Large right and small left mastoid effusions. MRI Orbits: 1. Prominent retrobulbar fat, bilateral exophthalmos, and mildly enlarged extraocular muscles bilaterally with sparing of the tendinous junction. Constellation of findings can be seen with thyroid-associated orbitopathy. Recommend correlation with TSH. 2. Otherwise, no evidence of acute orbital abnormality on this motion limited noncontrast study. 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 Pt asymptomatic. 2) mild cataract  3) CRAO OS    discussed with patient. At risk for LOV  OD, or CVA.   CT and MRI already done. No acute infarcts.   Manage CVA risk factors !  Pt walked out AMA following this exam.  LOS: 0 days   Birder Robson 6/30/202212:35 PM

## 2021-03-25 ENCOUNTER — Emergency Department (HOSPITAL_COMMUNITY): Payer: Medicare Other

## 2021-03-25 ENCOUNTER — Other Ambulatory Visit: Payer: Self-pay

## 2021-03-25 ENCOUNTER — Encounter (HOSPITAL_COMMUNITY): Payer: Self-pay

## 2021-03-25 ENCOUNTER — Observation Stay (HOSPITAL_COMMUNITY)
Admission: EM | Admit: 2021-03-25 | Discharge: 2021-03-26 | Disposition: A | Discharge status: Home / Self Care | Payer: Medicare Other | Attending: Internal Medicine | Admitting: Internal Medicine

## 2021-03-25 DIAGNOSIS — J810 Acute pulmonary edema: Secondary | ICD-10-CM | POA: Insufficient documentation

## 2021-03-25 DIAGNOSIS — I132 Hypertensive heart and chronic kidney disease with heart failure and with stage 5 chronic kidney disease, or end stage renal disease: Secondary | ICD-10-CM | POA: Insufficient documentation

## 2021-03-25 DIAGNOSIS — E875 Hyperkalemia: Secondary | ICD-10-CM | POA: Diagnosis not present

## 2021-03-25 DIAGNOSIS — Z79899 Other long term (current) drug therapy: Secondary | ICD-10-CM | POA: Diagnosis not present

## 2021-03-25 DIAGNOSIS — N186 End stage renal disease: Secondary | ICD-10-CM | POA: Insufficient documentation

## 2021-03-25 DIAGNOSIS — I10 Essential (primary) hypertension: Secondary | ICD-10-CM | POA: Diagnosis present

## 2021-03-25 DIAGNOSIS — Z20822 Contact with and (suspected) exposure to covid-19: Secondary | ICD-10-CM | POA: Insufficient documentation

## 2021-03-25 DIAGNOSIS — I5033 Acute on chronic diastolic (congestive) heart failure: Secondary | ICD-10-CM | POA: Insufficient documentation

## 2021-03-25 DIAGNOSIS — E8770 Fluid overload, unspecified: Secondary | ICD-10-CM | POA: Diagnosis not present

## 2021-03-25 DIAGNOSIS — Z992 Dependence on renal dialysis: Secondary | ICD-10-CM | POA: Diagnosis not present

## 2021-03-25 DIAGNOSIS — I480 Paroxysmal atrial fibrillation: Secondary | ICD-10-CM | POA: Insufficient documentation

## 2021-03-25 DIAGNOSIS — F17210 Nicotine dependence, cigarettes, uncomplicated: Secondary | ICD-10-CM | POA: Insufficient documentation

## 2021-03-25 DIAGNOSIS — R0602 Shortness of breath: Secondary | ICD-10-CM | POA: Diagnosis present

## 2021-03-25 DIAGNOSIS — D631 Anemia in chronic kidney disease: Secondary | ICD-10-CM | POA: Diagnosis present

## 2021-03-25 DIAGNOSIS — I509 Heart failure, unspecified: Secondary | ICD-10-CM

## 2021-03-25 LAB — BASIC METABOLIC PANEL
Anion gap: 21 — ABNORMAL HIGH (ref 5–15)
Anion gap: 22 — ABNORMAL HIGH (ref 5–15)
BUN: 124 mg/dL — ABNORMAL HIGH (ref 6–20)
BUN: 131 mg/dL — ABNORMAL HIGH (ref 6–20)
CO2: 18 mmol/L — ABNORMAL LOW (ref 22–32)
CO2: 19 mmol/L — ABNORMAL LOW (ref 22–32)
Calcium: 9.2 mg/dL (ref 8.9–10.3)
Calcium: 9.1 mg/dL (ref 8.9–10.3)
Chloride: 101 mmol/L (ref 98–111)
Chloride: 101 mmol/L (ref 98–111)
Creatinine, Ser: 18.24 mg/dL — ABNORMAL HIGH (ref 0.61–1.24)
Creatinine, Ser: 18.57 mg/dL — ABNORMAL HIGH (ref 0.61–1.24)
GFR, Estimated: 3 mL/min — ABNORMAL LOW (ref >60)
GFR, Estimated: 3 mL/min — ABNORMAL LOW (ref >60)
Glucose, Bld: 105 mg/dL — ABNORMAL HIGH (ref 70–99)
Glucose, Bld: 80 mg/dL (ref 70–99)
Potassium: 5.9 mmol/L — ABNORMAL HIGH (ref 3.5–5.1)
Potassium: 6.3 mmol/L (ref 3.5–5.1)
Sodium: 140 mmol/L (ref 135–145)
Sodium: 142 mmol/L (ref 135–145)

## 2021-03-25 LAB — CBC
HCT: 23.8 % — ABNORMAL LOW (ref 39.0–52.0)
Hemoglobin: 7.4 g/dL — ABNORMAL LOW (ref 13.0–17.0)
MCH: 33.2 pg (ref 26.0–34.0)
MCHC: 31.1 g/dL (ref 30.0–36.0)
MCV: 106.7 fL — ABNORMAL HIGH (ref 80.0–100.0)
Platelets: 148 10*3/uL — ABNORMAL LOW (ref 150–400)
RBC: 2.23 MIL/uL — ABNORMAL LOW (ref 4.22–5.81)
RDW: 18 % — ABNORMAL HIGH (ref 11.5–15.5)
WBC: 9.7 10*3/uL (ref 4.0–10.5)
nRBC: 0.5 % — ABNORMAL HIGH (ref 0.0–0.2)

## 2021-03-25 LAB — TROPONIN I (HIGH SENSITIVITY)
Troponin I (High Sensitivity): 124 ng/L (ref <18)
Troponin I (High Sensitivity): 121 ng/L (ref <18)

## 2021-03-25 LAB — RESP PANEL BY RT-PCR (FLU A&B, COVID) ARPGX2
Influenza A by PCR: NEGATIVE
Influenza B by PCR: NEGATIVE
SARS Coronavirus 2 by RT PCR: NEGATIVE

## 2021-03-25 LAB — TSH: TSH: 2.266 u[IU]/mL (ref 0.350–4.500)

## 2021-03-25 LAB — PREPARE RBC (CROSSMATCH)

## 2021-03-25 LAB — PHOSPHORUS: Phosphorus: 10.9 mg/dL — ABNORMAL HIGH (ref 2.5–4.6)

## 2021-03-25 IMAGING — CR DG CHEST 2V
2 series · 2 of 2 positions shown · non-contrast
Comparison: [DATE]

CLINICAL DATA: Chest pain for several days.

EXAM:
CHEST - 2 VIEW

[chest lat]
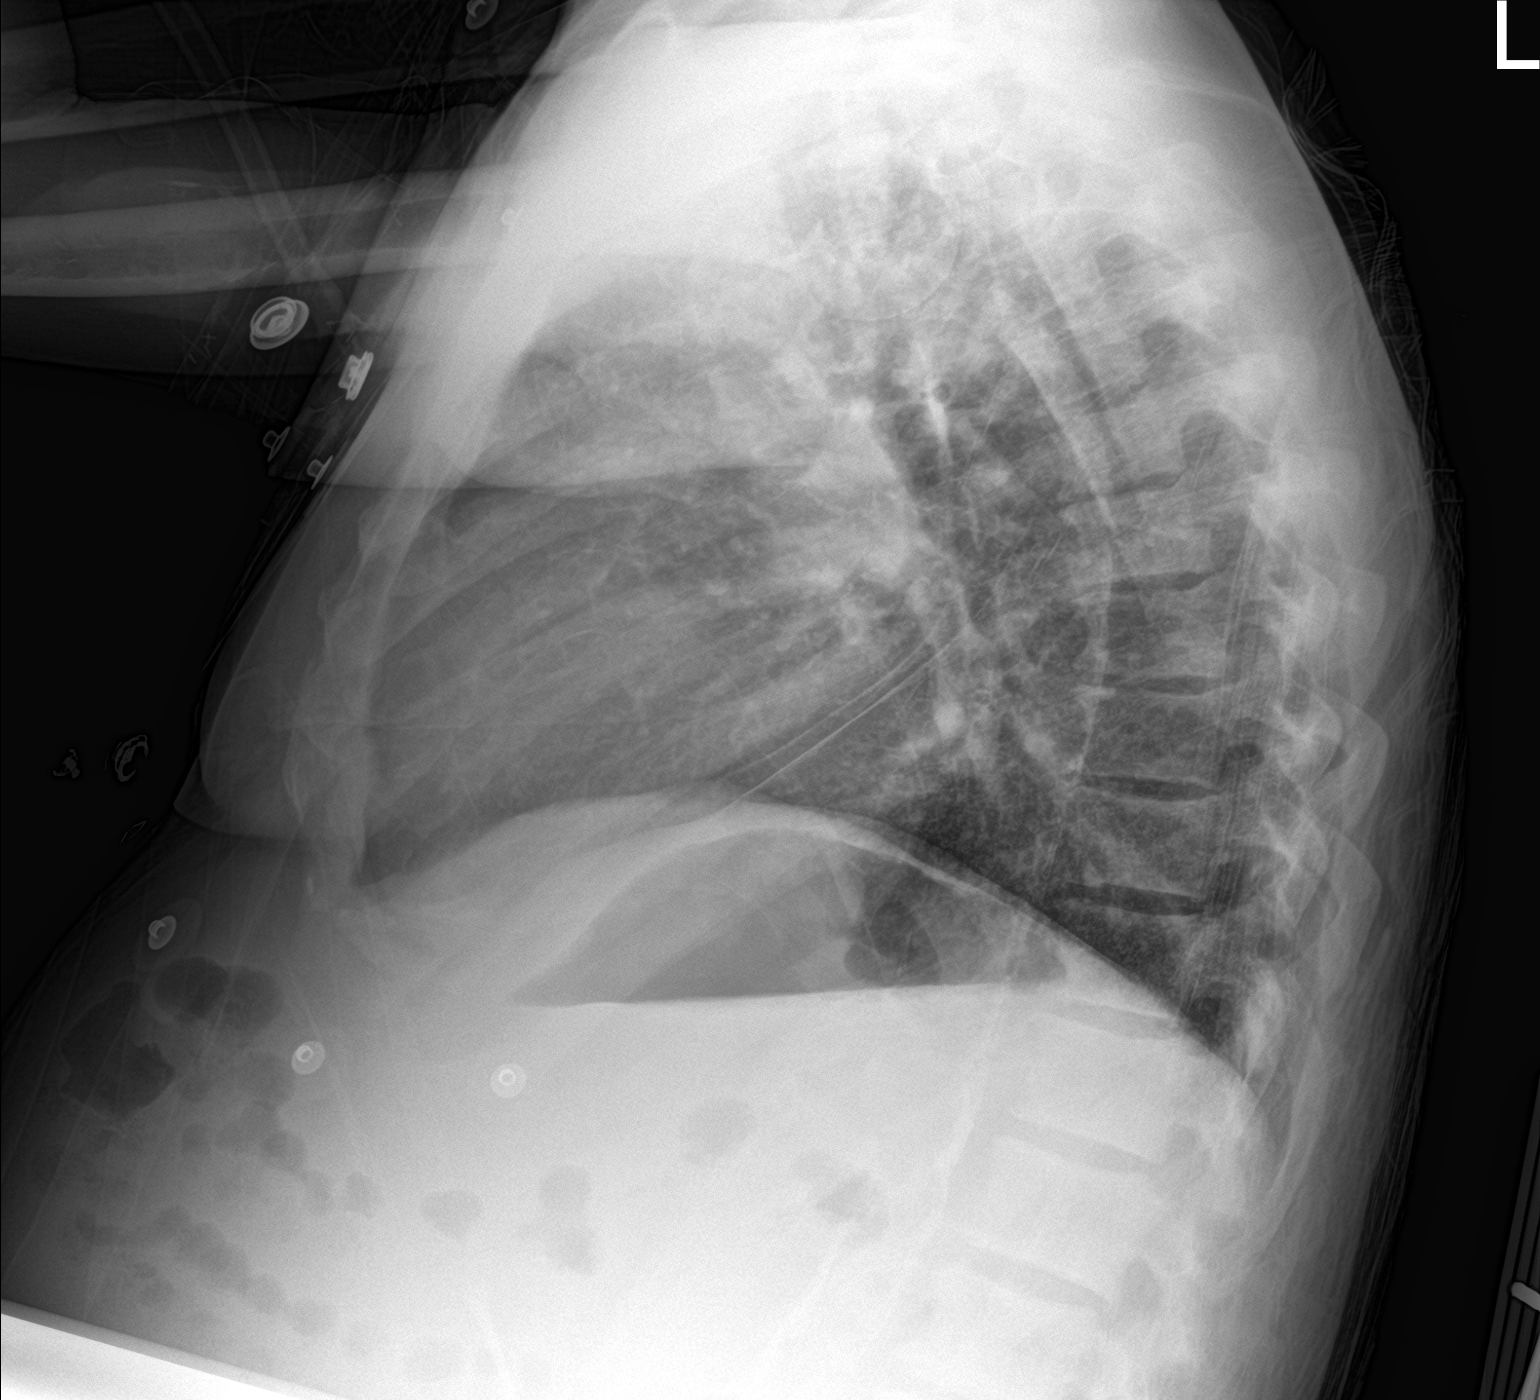

[chest ap]
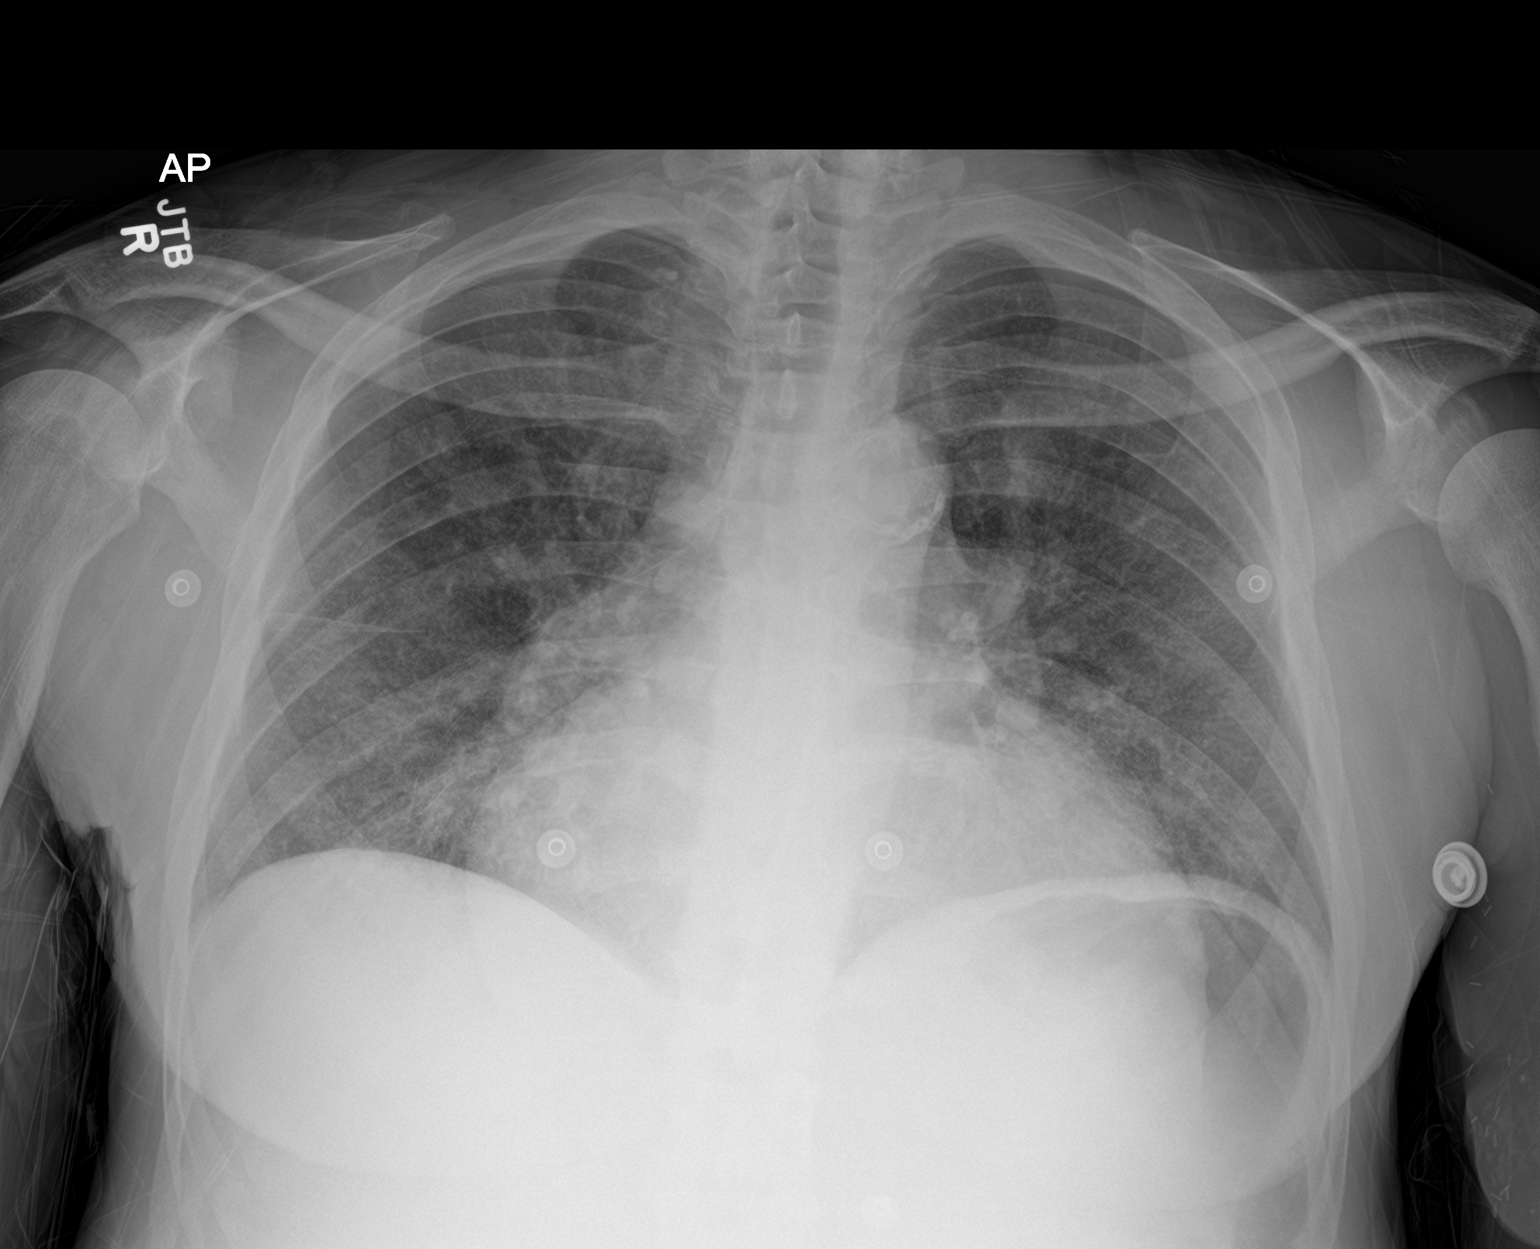

[2 of 2 positions shown; findings below may reference images not displayed]

FINDINGS: Stable large cardiac silhouette. Fine airspace disease similar to
prior. No pleural fluid. No pneumothorax. Low lung volumes.
IMPRESSION: Cardiomegaly and mild pulmonary edema pattern.

## 2021-03-25 MED ORDER — DARBEPOETIN ALFA 100 MCG/0.5ML IJ SOSY
PREFILLED_SYRINGE | INTRAMUSCULAR | Status: AC
  Administered 2021-03-25: 100 ug via INTRAVENOUS
  Filled 2021-03-25: qty 0.5

## 2021-03-25 MED ORDER — SODIUM ZIRCONIUM CYCLOSILICATE 10 G PO PACK
10.0000 g | PACK | Freq: Three times a day (TID) | ORAL | Status: DC
  Administered 2021-03-25: 10 g via ORAL
  Filled 2021-03-25: qty 1

## 2021-03-25 MED ORDER — HYDROXYZINE HCL 50 MG PO TABS
50.0000 mg | ORAL_TABLET | Freq: Three times a day (TID) | ORAL | Status: DC | PRN
  Administered 2021-03-25 – 2021-03-26 (×3): 50 mg via ORAL
  Filled 2021-03-25 (×3): qty 1

## 2021-03-25 MED ORDER — SODIUM ZIRCONIUM CYCLOSILICATE 10 G PO PACK
10.0000 g | PACK | Freq: Once | ORAL | Status: AC
  Administered 2021-03-25: 10 g via ORAL
  Filled 2021-03-25: qty 1

## 2021-03-25 MED ORDER — HYDRALAZINE HCL 25 MG PO TABS: 25.0000 mg | ORAL_TABLET | Freq: Four times a day (QID) | ORAL | Status: DC | PRN

## 2021-03-25 MED ORDER — DEXTROSE 50 % IV SOLN
50.0000 mL | Freq: Once | INTRAVENOUS | Status: AC
  Administered 2021-03-25: 50 mL via INTRAVENOUS
  Filled 2021-03-25: qty 50

## 2021-03-25 MED ORDER — FUROSEMIDE 10 MG/ML IJ SOLN: 40.0000 mg | Freq: Once | INTRAMUSCULAR | Status: DC

## 2021-03-25 MED ORDER — FERRIC CITRATE 1 GM 210 MG(FE) PO TABS: 420.0000 mg | ORAL_TABLET | ORAL | Status: DC

## 2021-03-25 MED ORDER — INSULIN ASPART 100 UNIT/ML IV SOLN
10.0000 [IU] | Freq: Once | INTRAVENOUS | Status: AC
  Administered 2021-03-25: 10 [IU] via INTRAVENOUS

## 2021-03-25 MED ORDER — SUCROFERRIC OXYHYDROXIDE 500 MG PO CHEW
500.0000 mg | CHEWABLE_TABLET | Freq: Three times a day (TID) | ORAL | Status: DC
  Administered 2021-03-25: 500 mg via ORAL
  Filled 2021-03-25: qty 1

## 2021-03-25 MED ORDER — CINACALCET HCL 30 MG PO TABS
60.0000 mg | ORAL_TABLET | Freq: Two times a day (BID) | ORAL | Status: DC
  Filled 2021-03-25 (×2): qty 2

## 2021-03-25 MED ORDER — WITCH HAZEL-GLYCERIN EX PADS
MEDICATED_PAD | CUTANEOUS | Status: DC | PRN
  Filled 2021-03-25: qty 100

## 2021-03-25 MED ORDER — SODIUM CHLORIDE 0.9 % IV SOLN: 250.0000 mL | INTRAVENOUS | Status: DC | PRN

## 2021-03-25 MED ORDER — CALCIUM ACETATE (PHOS BINDER) 667 MG PO CAPS: 1334.0000 mg | ORAL_CAPSULE | Freq: Three times a day (TID) | ORAL | Status: DC

## 2021-03-25 MED ORDER — DARBEPOETIN ALFA 100 MCG/0.5ML IJ SOSY: 100.0000 ug | PREFILLED_SYRINGE | INTRAMUSCULAR | Status: DC

## 2021-03-25 MED ORDER — HEPARIN SODIUM (PORCINE) 5000 UNIT/ML IJ SOLN
5000.0000 [IU] | Freq: Three times a day (TID) | INTRAMUSCULAR | Status: DC
  Administered 2021-03-25 – 2021-03-26 (×2): 5000 [IU] via SUBCUTANEOUS
  Filled 2021-03-25 (×2): qty 1

## 2021-03-25 MED ORDER — PHENYLEPHRINE-MINERAL OIL-PET 0.25-14-74.9 % RE OINT
1.0000 "application " | TOPICAL_OINTMENT | Freq: Two times a day (BID) | RECTAL | Status: DC | PRN
  Filled 2021-03-25: qty 57

## 2021-03-25 MED ORDER — ACETAMINOPHEN 325 MG PO TABS: 650.0000 mg | ORAL_TABLET | ORAL | Status: DC | PRN

## 2021-03-25 MED ORDER — POLYETHYLENE GLYCOL 3350 17 G PO PACK
17.0000 g | PACK | Freq: Every day | ORAL | Status: DC
  Administered 2021-03-26: 17 g via ORAL
  Filled 2021-03-25: qty 1

## 2021-03-25 MED ORDER — SODIUM CHLORIDE 0.9% FLUSH: 3.0000 mL | INTRAVENOUS | Status: DC | PRN

## 2021-03-25 MED ORDER — SODIUM CHLORIDE 0.9% FLUSH
3.0000 mL | Freq: Two times a day (BID) | INTRAVENOUS | Status: DC
  Administered 2021-03-25 – 2021-03-26 (×2): 3 mL via INTRAVENOUS

## 2021-03-25 MED ORDER — GABAPENTIN 100 MG PO CAPS
100.0000 mg | ORAL_CAPSULE | Freq: Two times a day (BID) | ORAL | Status: DC
  Administered 2021-03-25 – 2021-03-26 (×2): 100 mg via ORAL
  Filled 2021-03-25 (×2): qty 1

## 2021-03-25 MED ORDER — CARVEDILOL 3.125 MG PO TABS
3.1250 mg | ORAL_TABLET | Freq: Two times a day (BID) | ORAL | Status: DC
  Filled 2021-03-25: qty 1

## 2021-03-25 MED ORDER — THIAMINE HCL 100 MG PO TABS
100.0000 mg | ORAL_TABLET | Freq: Every day | ORAL | Status: DC
  Administered 2021-03-26: 100 mg via ORAL
  Filled 2021-03-25: qty 1

## 2021-03-25 MED ORDER — CHLORHEXIDINE GLUCONATE CLOTH 2 % EX PADS: 6.0000 | MEDICATED_PAD | Freq: Every day | CUTANEOUS | Status: DC

## 2021-03-25 MED ORDER — SENNOSIDES-DOCUSATE SODIUM 8.6-50 MG PO TABS
1.0000 | ORAL_TABLET | Freq: Every day | ORAL | Status: DC
  Administered 2021-03-25: 1 via ORAL
  Filled 2021-03-25: qty 1

## 2021-03-25 MED ORDER — SODIUM BICARBONATE 650 MG PO TABS: 650.0000 mg | ORAL_TABLET | Freq: Two times a day (BID) | ORAL | Status: DC

## 2021-03-25 MED ORDER — SODIUM CHLORIDE 0.9% IV SOLUTION: Freq: Once | INTRAVENOUS | Status: DC

## 2021-03-25 MED ORDER — SODIUM BICARBONATE 650 MG PO TABS
650.0000 mg | ORAL_TABLET | Freq: Two times a day (BID) | ORAL | Status: DC
  Administered 2021-03-25: 650 mg via ORAL
  Filled 2021-03-25: qty 1

## 2021-03-25 MED ORDER — ONDANSETRON HCL 4 MG/2ML IJ SOLN: 4.0000 mg | Freq: Four times a day (QID) | INTRAMUSCULAR | Status: DC | PRN

## 2021-03-25 MED ORDER — FLUTICASONE PROPIONATE 50 MCG/ACT NA SUSP
1.0000 | Freq: Every day | NASAL | Status: DC
  Administered 2021-03-25 – 2021-03-26 (×2): 1 via NASAL
  Filled 2021-03-25: qty 16

## 2021-03-25 NOTE — Progress Notes (Signed)
HD called for report, pt was finished with treatment and the unit of blood. This RN called ED charge and asked for the nurse that had taken care of patient. That nurse had gone home. This RN asked ED charge if any of the meds had been given and charge said to assume they haven't. MD paged to notify and ask if he wanted this RN to give any of the meds that were supposed to be administered today and MD replied "no".

## 2021-03-25 NOTE — ED Notes (Signed)
Report given Burton Apley, RN in Dialysis. Pt going to [facility]

## 2021-03-25 NOTE — Progress Notes (Signed)
Pt came to HD unit from ED  Goal was 4-5 L, over 4 hours  Pt cramping, demanding to be taken off the machine. He was on for 2 hours 43 minutes, 3397 pulled, but given 311m blood admin, & 500 rinseback, about 2.5 L altogether.  Signed AMA paperwork, MD notified  Transferring back to room with Officer at bedside

## 2021-03-25 NOTE — Consult Note (Signed)
[facility] Renal Consultation Note    Indication for Consultation:  Management of ESRD/hemodialysis, anemia, hypertension/volume, and secondary hyperparathyroidism. PCP:  HPI: Jordan [name] is a 55 y.o. male with ESRD on HD, HTN, COPD who is being admitted for dialysis.  He is chronically non-compliant with HD treatments, chronically overloaded. S/p [facility] admit 6/29-6/30 with dyspnea and L eye vision loss. Dialyzed on 6/29 and then left AMA on 6/30. 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 Brain MRI without acute CVA. Unfortunately, he was arrested on Saturday 7/2 and is currently in police custody. Per the officer with him today, the prison does not have transportation available to take him to his usual dialysis today d/t the holiday, but the prison will provide transportation to his usual dialysis treatment on non-holidays (starting 7/6 and thereafter).  Seen in ED bed today, his is grossly overloaded on exam. Denies chest or abdominal pain. No N/V/D, no fever or chills. C/o mild dyspnea, B leg pain, and chronic itching on buttocks/legs and forehead.  Dialyzes on MWF sched at [facility] ([place]. Last treatment there was on 03/18/21 of which he completed 27 minutes of 4hr scheduled run time. He uses a L AVF as his access.  Past Medical History:  Diagnosis Date   Anemia    Aortic atherosclerosis ([place]) 11/12/2019   CKD (chronic kidney disease)    Stage 5  Dialysis - M/W/F in [place], [place]   Dyspnea    tx with inhaler when sick   ED (erectile dysfunction)    Emphysema of lung ([place]) 11/12/2019   ETOH abuse    History of blood transfusion    Hypertension    Wears dentures    Past Surgical History:  Procedure Laterality Date   BASCILIC VEIN TRANSPOSITION Left 08/07/2016   Procedure: LEFT BASILIC VEIN TRANSPOSITION;  Surgeon: Angelia Mould, MD;  Location: [place];  Service: Vascular;  Laterality:  Left;   CLOSED REDUCTION NASAL FRACTURE N/A 08/11/2019   Procedure: CLOSED REDUCTION NASAL FRACTURE WITH STABILIZATION;  Surgeon: Irene Limbo, MD;  Location: [place];  Service: Plastics;  Laterality: N/A;   COLONOSCOPY N/A 08/12/2016   Procedure: COLONOSCOPY;  Surgeon: Otis Brace, MD;  Location: [place];  Service: Gastroenterology;  Laterality: N/A;   ESOPHAGOGASTRODUODENOSCOPY N/A 08/12/2016   Procedure: ESOPHAGOGASTRODUODENOSCOPY (EGD);  Surgeon: Otis Brace, MD;  Location: [place];  Service: Gastroenterology;  Laterality: N/A;   INSERTION OF DIALYSIS CATHETER N/A 08/07/2016   Procedure: INSERTION OF TUNNELED DIALYSIS CATHETER;  Surgeon: Angelia Mould, MD;  Location: [place];  Service: Vascular;  Laterality: N/A;   LIGATION OF ARTERIOVENOUS  FISTULA Left 09/12/2016   Procedure: BANDING OF LEFT  ARTERIOVENOUS  FISTULA;  Surgeon: Angelia Mould, MD;  Location: [place];  Service: Vascular;  Laterality: Left;   LOWER EXTREMITY INTERVENTION Right 12/02/2018   Procedure: LOWER EXTREMITY INTERVENTION;  Surgeon: Algernon Huxley, MD;  Location: [facility];  Service: Cardiovascular;  Laterality: Right;   Family History  Problem Relation Age of Onset   Diabetes Mother    Kidney failure Mother    Healthy Father    Kidney failure Brother    Healthy Sister    Kidney disease Daughter    Post-traumatic stress disorder Neg Hx    Bladder Cancer Neg Hx    Kidney cancer Neg Hx    Social History:  reports that he has been smoking cigarettes. He has a  20.00 pack-year smoking history. He has never used smokeless tobacco. He reports current alcohol use of about 21.0 standard drinks of alcohol per week. He reports that he does not use drugs.  ROS: As per HPI otherwise negative.  Physical Exam: Vitals:   03/25/21 1135 03/25/21 1148 03/25/21 1200 03/25/21 1215  BP:  132/82 (!) 142/90 (!) 141/92  Pulse:      Resp:  17 (!) 24 (!) 22  Temp:      SpO2:      Weight:  76 kg     Height: '5\' 4"'$  (1.626 m)        General: Well developed, well nourished, in no acute distress. Siginificant facial edema. Head: Normocephalic, atraumatic, sclera non-icteric, mucus membranes are moist. Neck: Supple without lymphadenopathy/masses.  Lungs: Clear bilaterally to auscultation anteriorly, dull B bases. Heart: RRR with normal S1, S2. No murmurs, rubs, or gallops appreciated. Abdomen: Soft, non-tender, but distended. Musculoskeletal:  Strength and tone appear normal for age. Lower extremities: 4+ BLE edema to knees Neuro: Alert and oriented X 3. Moves all extremities spontaneously. Psych:  Responds to questions appropriately with a normal affect. Dialysis Access: LUE AVF + bruit  Allergies  Allergen Reactions   Lisinopril Swelling   Prior to Admission medications   Medication Sig Start Date End Date Taking? Authorizing Provider  ALPRAZolam (XANAX) 0.25 MG tablet Take 0.25 mg by mouth 2 (two) times daily as needed. Patient not taking: Reported on 03/20/2021 02/27/21   [provider]  AURYXIA 1 GM 210 MG(Fe) tablet Take 420 mg by mouth every Monday, Wednesday, and Friday with hemodialysis. 11/13/20   [provider]  calcium acetate (PHOSLO) 667 MG capsule Take 2 capsules (1,334 mg total) by mouth 3 (three) times daily with meals. 03/11/21   Lorella Nimrod, MD  cinacalcet (SENSIPAR) 60 MG tablet SMARTSIG:2 Tablet(s) By Mouth Every Evening 03/18/21   [provider]  cinacalcet (SENSIPAR) 90 MG tablet Take 1 tablet by mouth every evening. Patient not taking: Reported on 03/20/2021 11/28/20   [provider]  epoetin alfa (EPOGEN) 4000 UNIT/ML injection Inject 1 mL (4,000 Units total) into the vein every Monday, Wednesday, and Friday with hemodialysis. 06/22/20   Enzo Bi, MD  hydrALAZINE (APRESOLINE) 50 MG tablet Take 1 tablet (50 mg total) by mouth 3 (three) times daily as needed. Patient not taking: Reported on 03/20/2021 01/15/21   Lorella Nimrod, MD  hydrOXYzine (ATARAX/VISTARIL) 25 MG tablet Take 25 mg by mouth 3 (three) times daily. Patient not taking: Reported on 03/20/2021 03/15/21   [provider]  hydrOXYzine (VISTARIL) 50 MG capsule Take 1 capsule (50 mg total) by mouth 3 (three) times daily as needed. 02/26/21   Cuthriell, Charline Bills, PA-C  Lidocaine 3 % CREA Apply 1 application topically 2 (two) times daily as needed (pain). 02/26/21   Cuthriell, Charline Bills, PA-C  sevelamer carbonate (RENVELA) 800 MG tablet Take 800 mg by mouth 3 (three) times daily with meals. Patient not taking: Reported on 03/20/2021    [provider]  thiamine 100 MG tablet Take 1 tablet (100 mg total) by mouth daily. 03/11/21   Lorella Nimrod, MD  VELPHORO 500 MG chewable tablet Chew 500 mg by mouth 3 (three) times daily. 03/15/21   [provider]   Current Facility-Administered Medications  Medication Dose Route Frequency Provider Last Rate Last Admin   sodium zirconium cyclosilicate (LOKELMA) packet 10 g  10 g Oral Once Loren Racer, PA-C  Current Outpatient Medications  Medication Sig Dispense Refill   ALPRAZolam (XANAX) 0.25 MG tablet Take 0.25 mg by mouth 2 (two) times daily as needed. (Patient not taking: Reported on 03/20/2021)     AURYXIA 1 GM 210 MG(Fe) tablet Take 420 mg by mouth every Monday, Wednesday, and Friday with hemodialysis.     calcium acetate (PHOSLO) 667 MG capsule Take 2 capsules (1,334 mg total) by mouth 3 (three) times daily with meals. 90 capsule 2   cinacalcet (SENSIPAR) 60 MG tablet SMARTSIG:2 Tablet(s) By Mouth Every Evening     cinacalcet (SENSIPAR) 90 MG tablet Take 1 tablet by mouth every evening. (Patient not taking: Reported on 03/20/2021)     epoetin alfa (EPOGEN) 4000 UNIT/ML injection Inject 1 mL (4,000 Units total) into the vein every Monday, Wednesday, and Friday with hemodialysis. 1 mL    hydrALAZINE (APRESOLINE) 50 MG tablet Take 1 tablet (50 mg total) by mouth 3 (three) times  daily as needed. (Patient not taking: Reported on 03/20/2021) 90 tablet 0   hydrOXYzine (ATARAX/VISTARIL) 25 MG tablet Take 25 mg by mouth 3 (three) times daily. (Patient not taking: Reported on 03/20/2021)     hydrOXYzine (VISTARIL) 50 MG capsule Take 1 capsule (50 mg total) by mouth 3 (three) times daily as needed. 30 capsule 0   Lidocaine 3 % CREA Apply 1 application topically 2 (two) times daily as needed (pain). 30 g 0   sevelamer carbonate (RENVELA) 800 MG tablet Take 800 mg by mouth 3 (three) times daily with meals. (Patient not taking: Reported on 03/20/2021)     thiamine 100 MG tablet Take 1 tablet (100 mg total) by mouth daily.     VELPHORO 500 MG chewable tablet Chew 500 mg by mouth 3 (three) times daily.     Labs: Basic Metabolic Panel: Recent Labs  Lab 03/20/21 0740 03/21/21 0553 03/25/21 0955  NA 146* 142 140  K 4.8 4.5 5.9*  CL 105 100 101  CO2 19* 24 18*  GLUCOSE 124* 134* 105*  BUN 96* 68* 124*  CREATININE 18.44* 12.87* 18.24*  CALCIUM 9.5 9.4 9.2  PHOS  --  6.7*  --    CBC: Recent Labs  Lab 03/20/21 0740 03/21/21 0553 03/25/21 0955  WBC 7.3 6.5 9.7  HGB 7.4* 7.4* 7.4*  HCT 22.8* 23.3* 23.8*  MCV 100.4* 100.9* 106.7*  PLT 123* 106* 148*   Studies/Results: DG Chest 2 View  Result Date: 03/25/2021 CLINICAL DATA:  Chest pain for several days. EXAM: CHEST - 2 VIEW COMPARISON:  03/20/2021 FINDINGS: Stable large cardiac silhouette. Fine airspace disease similar to prior. No pleural fluid. No pneumothorax. Low lung volumes. IMPRESSION: Cardiomegaly and mild pulmonary edema pattern. Electronically Signed   By: Suzy Bouchard M.D.   On: 03/25/2021 10:08    Dialysis Orders:  MWF at [facility] - followed by our group ([facility]) 4hr, 450/800, EDW 67kg, 2K/2Ca, LUE AVF, no heparin - Mircera 252mg Iv q 2 weeks (last got 523m on 5/27) - Calcitriol 1.44m544mPO q HD - Supposed to take PO Auryxia as binder, PO sensipar 180 QHS at home - doubt  compliance.  Assessment/Plan:  Volume/overload: Mild dyspnea, very edematous on exam. In addition to hyperkalemia and ^ BUN, needs HD today - ordered. Hyperkalemia: K 5.9 - Lokelma 10g for now, will dialyze today.  ESRD:  Usual MWF schedule -- ongoing compliance issues which he attributes to severe itching while on HD, signs off early almost every HD. Looks like last HD was  6/29, then unfortunately now arrested/in police custody. For HD today, then hopefully discharge back to prison tomorrow and can be transported to HD like usual on Wed. Due to high hospital census, dialysis will likely be very late today, possibly overnight.  Hypertension/volume: BP high - at least 10kg up per weights, chronic issue. UF as tolerated.  Anemia: Hgb 7.4 - due for ESA, will give today.  Metabolic bone disease: Ca ok, Phos pending. Resume home meds.  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 Also, concern for thyroid eye disease, will check TSH.  Veneta Penton, PA-C 03/25/2021, 12:32 PM  [facility]

## 2021-03-25 NOTE — ED Triage Notes (Signed)
Patient arrived from jail with request for dialysis. Last dialysis treatment was Friday. Patient complains of swelling and states I need dialysis today, NAD

## 2021-03-25 NOTE — H&P (Signed)
History and Physical    SALEH BLOODSAW [id number] DOB: 11-27-1965 DOA: 03/25/2021  PCP: Pcp, No (Confirm with patient/family/NH records and if not entered, this has to be entered at [facility] point of entry) Patient coming from: [place]  I have personally briefly reviewed patient's old medical records in [place]  Chief Complaint: SOB and leg swelling  HPI: Jordan [name] is a 55 y.o. male with medical history significant of ESRD on HD, chronic combined systolic diastolic CHF, chronic anemia secondary to CKD, HTN, presented with increasing shortness of breath and leg swelling.  Patient was incarcerated in prison last week, last dialysis was on Friday full session.  Through the weekend, patient started to feel shortness of breath, and increasing leg swelling.  Denies any chest pain, no cough no fever chills.  Patient was recently admitted to [facility] for acute onset of vision changes on the left eye, but left AMA.  Patient claimed that this vision problem resolved last week.  ED Course: Blood pressure elevated, x-ray showed pulmonary congestion, physical exam showed patient has fluid overload.  Blood work showed potassium 5.9, no EKG ST changes.  Patient was given 1 dose of Lokelma, nephrology consulted scheduled for HD tonight.  Review of Systems: As per HPI otherwise 14 point review of systems negative.    Past Medical History:  Diagnosis Date   Anemia    Aortic atherosclerosis ([place]) 11/12/2019   CKD (chronic kidney disease)    Stage 5  Dialysis - M/W/F in [place], [place]   Dyspnea    tx with inhaler when sick   ED (erectile dysfunction)    Emphysema of lung ([place]) 11/12/2019   ETOH abuse    History of blood transfusion    Hypertension    Wears dentures     Past Surgical History:  Procedure Laterality Date   BASCILIC VEIN TRANSPOSITION Left 08/07/2016   Procedure: LEFT BASILIC VEIN TRANSPOSITION;  Surgeon: Angelia Mould, MD;  Location: [place];  Service: Vascular;   Laterality: Left;   CLOSED REDUCTION NASAL FRACTURE N/A 08/11/2019   Procedure: CLOSED REDUCTION NASAL FRACTURE WITH STABILIZATION;  Surgeon: Irene Limbo, MD;  Location: [place];  Service: Plastics;  Laterality: N/A;   COLONOSCOPY N/A 08/12/2016   Procedure: COLONOSCOPY;  Surgeon: Otis Brace, MD;  Location: [place];  Service: Gastroenterology;  Laterality: N/A;   ESOPHAGOGASTRODUODENOSCOPY N/A 08/12/2016   Procedure: ESOPHAGOGASTRODUODENOSCOPY (EGD);  Surgeon: Otis Brace, MD;  Location: [place];  Service: Gastroenterology;  Laterality: N/A;   INSERTION OF DIALYSIS CATHETER N/A 08/07/2016   Procedure: INSERTION OF TUNNELED DIALYSIS CATHETER;  Surgeon: Angelia Mould, MD;  Location: [place];  Service: Vascular;  Laterality: N/A;   LIGATION OF ARTERIOVENOUS  FISTULA Left 09/12/2016   Procedure: BANDING OF LEFT  ARTERIOVENOUS  FISTULA;  Surgeon: Angelia Mould, MD;  Location: [place];  Service: Vascular;  Laterality: Left;   LOWER EXTREMITY INTERVENTION Right 12/02/2018   Procedure: LOWER EXTREMITY INTERVENTION;  Surgeon: Algernon Huxley, MD;  Location: [place] [facility];  Service: Cardiovascular;  Laterality: Right;     reports that he has been smoking cigarettes. He has a 20.00 pack-year smoking history. He has never used smokeless tobacco. He reports current alcohol use of about 21.0 standard drinks of alcohol per week. He reports that he does not use drugs.  Allergies  Allergen Reactions   Lisinopril Swelling    Family History  Problem Relation Age of Onset   Diabetes Mother    Kidney  failure Mother    Healthy Father    Kidney failure Brother    Healthy Sister    Kidney disease Daughter    Post-traumatic stress disorder Neg Hx    Bladder Cancer Neg Hx    Kidney cancer Neg Hx      Prior to Admission medications   Medication Sig Start Date End Date Taking? Authorizing Provider  ALPRAZolam (XANAX) 0.25 MG tablet Take 0.25 mg by mouth 2 (two) times  daily as needed. Patient not taking: Reported on 03/20/2021 02/27/21   [provider]  AURYXIA 1 GM 210 MG(Fe) tablet Take 420 mg by mouth every Monday, Wednesday, and Friday with hemodialysis. 11/13/20   [provider]  calcium acetate (PHOSLO) 667 MG capsule Take 2 capsules (1,334 mg total) by mouth 3 (three) times daily with meals. 03/11/21   Lorella Nimrod, MD  cinacalcet (SENSIPAR) 60 MG tablet SMARTSIG:2 Tablet(s) By Mouth Every Evening 03/18/21   [provider]  cinacalcet (SENSIPAR) 90 MG tablet Take 1 tablet by mouth every evening. Patient not taking: Reported on 03/20/2021 11/28/20   [provider]  epoetin alfa (EPOGEN) 4000 UNIT/ML injection Inject 1 mL (4,000 Units total) into the vein every Monday, Wednesday, and Friday with hemodialysis. 06/22/20   Enzo Bi, MD  hydrALAZINE (APRESOLINE) 50 MG tablet Take 1 tablet (50 mg total) by mouth 3 (three) times daily as needed. Patient not taking: Reported on 03/20/2021 01/15/21   Lorella Nimrod, MD  hydrOXYzine (ATARAX/VISTARIL) 25 MG tablet Take 25 mg by mouth 3 (three) times daily. Patient not taking: Reported on 03/20/2021 03/15/21   [provider]  hydrOXYzine (VISTARIL) 50 MG capsule Take 1 capsule (50 mg total) by mouth 3 (three) times daily as needed. 02/26/21   Cuthriell, Charline Bills, PA-C  Lidocaine 3 % CREA Apply 1 application topically 2 (two) times daily as needed (pain). 02/26/21   Cuthriell, Charline Bills, PA-C  sevelamer carbonate (RENVELA) 800 MG tablet Take 800 mg by mouth 3 (three) times daily with meals. Patient not taking: Reported on 03/20/2021    [provider]  thiamine 100 MG tablet Take 1 tablet (100 mg total) by mouth daily. 03/11/21   Lorella Nimrod, MD  VELPHORO 500 MG chewable tablet Chew 500 mg by mouth 3 (three) times daily. 03/15/21   [provider]    Physical Exam: Vitals:   03/25/21 1135 03/25/21 1148 03/25/21 1200 03/25/21 1215  BP:  132/82 (!) 142/90 (!)  141/92  Pulse:      Resp:  17 (!) 24 (!) 22  Temp:      SpO2:      Weight: 76 kg     Height: '5\' 4"'$  (1.626 m)       Constitutional: NAD, calm, comfortable Vitals:   03/25/21 1135 03/25/21 1148 03/25/21 1200 03/25/21 1215  BP:  132/82 (!) 142/90 (!) 141/92  Pulse:      Resp:  17 (!) 24 (!) 22  Temp:      SpO2:      Weight: 76 kg     Height: '5\' 4"'$  (1.626 m)      Eyes: PERRL, lids and conjunctivae normal ENMT: Mucous membranes are moist. Posterior pharynx clear of any exudate or lesions.Normal dentition.  Neck: normal, supple, no masses, no thyromegaly Respiratory: clear to auscultation bilaterally, no wheezing, fine crackles on B/L bases.  Increasing respiratory effort. No accessory muscle use.  Cardiovascular: Regular rate and rhythm, no murmurs / rubs / gallops. 2+ extremity edema.  2+ pedal pulses. No carotid bruits.  Abdomen: no tenderness, no masses palpated. No hepatosplenomegaly. Bowel sounds positive.  Musculoskeletal: no clubbing / cyanosis. No joint deformity upper and lower extremities. Good ROM, no contractures. Normal muscle tone.  Skin: no rashes, lesions, ulcers. No induration Neurologic: CN 2-12 grossly intact. Sensation intact, DTR normal. Strength 5/5 in all 4.  Psychiatric: Normal judgment and insight. Alert and oriented x 3. Normal mood.     Labs on Admission: I have personally reviewed following labs and imaging studies  CBC: Recent Labs  Lab 03/20/21 0740 03/21/21 0553 03/25/21 0955  WBC 7.3 6.5 9.7  HGB 7.4* 7.4* 7.4*  HCT 22.8* 23.3* 23.8*  MCV 100.4* 100.9* 106.7*  PLT 123* 106* [id number]*   Basic Metabolic Panel: Recent Labs  Lab 03/20/21 0740 03/21/21 0553 03/25/21 0955  NA 146* 142 140  K 4.8 4.5 5.9*  CL 105 100 101  CO2 19* 24 18*  GLUCOSE 124* 134* 105*  BUN 96* 68* 124*  CREATININE 18.44* 12.87* 18.24*  CALCIUM 9.5 9.4 9.2  PHOS  --  6.7*  --    GFR: Estimated Creatinine Clearance: 4.4 mL/min (A) (by C-G formula based on SCr of  18.24 mg/dL (H)). Liver Function Tests: No results for input(s): AST, ALT, ALKPHOS, BILITOT, PROT, ALBUMIN in the last 168 hours. No results for input(s): LIPASE, AMYLASE in the last 168 hours. No results for input(s): AMMONIA in the last 168 hours. Coagulation Profile: No results for input(s): INR, PROTIME in the last 168 hours. Cardiac Enzymes: No results for input(s): CKTOTAL, CKMB, CKMBINDEX, TROPONINI in the last 168 hours. BNP (last 3 results) No results for input(s): PROBNP in the last 8760 hours. HbA1C: No results for input(s): HGBA1C in the last 72 hours. CBG: No results for input(s): GLUCAP in the last 168 hours. Lipid Profile: No results for input(s): CHOL, HDL, LDLCALC, TRIG, CHOLHDL, LDLDIRECT in the last 72 hours. Thyroid Function Tests: No results for input(s): TSH, T4TOTAL, FREET4, T3FREE, THYROIDAB in the last 72 hours. Anemia Panel: No results for input(s): VITAMINB12, FOLATE, FERRITIN, TIBC, IRON, RETICCTPCT in the last 72 hours. Urine analysis:    Component Value Date/Time   COLORURINE YELLOW (A) 01/13/2021 0153   APPEARANCEUR CLEAR (A) 01/13/2021 0153   LABSPEC 1.012 01/13/2021 0153   PHURINE 9.0 (H) 01/13/2021 0153   GLUCOSEU 150 (A) 01/13/2021 0153   HGBUR NEGATIVE 01/13/2021 0153   BILIRUBINUR NEGATIVE 01/13/2021 0153   KETONESUR NEGATIVE 01/13/2021 0153   PROTEINUR >=300 (A) 01/13/2021 0153   NITRITE NEGATIVE 01/13/2021 0153   LEUKOCYTESUR NEGATIVE 01/13/2021 0153    Radiological Exams on Admission: DG Chest 2 View  Result Date: 03/25/2021 CLINICAL DATA:  Chest pain for several days. EXAM: CHEST - 2 VIEW COMPARISON:  03/20/2021 FINDINGS: Stable large cardiac silhouette. Fine airspace disease similar to prior. No pleural fluid. No pneumothorax. Low lung volumes. IMPRESSION: Cardiomegaly and mild pulmonary edema pattern. Electronically Signed   By: Suzy Bouchard M.D.   On: 03/25/2021 10:08    EKG: Independently reviewed. Sinus, no ST-T  changes.  Assessment/Plan Active Problems:   Acute CHF (congestive heart failure) (HCC)   CHF (congestive heart failure) ([place])  (please populate well all problems here in Problem List. (For example, if patient is on BP meds at home and you resume or decide to hold them, it is a problem that needs to be her. Same for CAD, COPD, HLD and so on)  Acute on chronic combined systolic and diastolic CHF decompensation -Symptoms  signs of fluid overload. -Patient reports " still makes little urine", gave 1 dose of 40 mg IV Lasix.  HD tonight as per nephrology. -Somewhat elevated, will start Coreg and as needed hydralazine.  Hyperkalemia -Received 1 dose of Lokelma, start bicarb pills.  EKG showed no ST changes, 1 dose of Lasix given, repeat potassium level 18:00  HTN -As above  Worsening of chronic anemia -Likely related to CKD.  Baseline hemoglobin> 8. -Given the patient-condition of decompensated CHF decided to give patient 1 unit PRBC.   DVT prophylaxis: Heparin subcu Code Status: Full Code Family Communication: None at bedside Disposition Plan: Expect less than 2 midnight hospital stay Consults called: Nephrology Admission status: Tele obs   Lequita Halt MD [facility] Pager [phone]  03/25/2021, 12:51 PM

## 2021-03-26 DIAGNOSIS — I509 Heart failure, unspecified: Secondary | ICD-10-CM | POA: Diagnosis not present

## 2021-03-26 DIAGNOSIS — E8770 Fluid overload, unspecified: Secondary | ICD-10-CM

## 2021-03-26 DIAGNOSIS — I5033 Acute on chronic diastolic (congestive) heart failure: Secondary | ICD-10-CM

## 2021-03-26 DIAGNOSIS — I10 Essential (primary) hypertension: Secondary | ICD-10-CM

## 2021-03-26 DIAGNOSIS — N186 End stage renal disease: Secondary | ICD-10-CM

## 2021-03-26 DIAGNOSIS — Z992 Dependence on renal dialysis: Secondary | ICD-10-CM

## 2021-03-26 LAB — BASIC METABOLIC PANEL
Anion gap: 17 — ABNORMAL HIGH (ref 5–15)
BUN: 72 mg/dL — ABNORMAL HIGH (ref 6–20)
CO2: 24 mmol/L (ref 22–32)
Calcium: 8.6 mg/dL — ABNORMAL LOW (ref 8.9–10.3)
Chloride: 99 mmol/L (ref 98–111)
Creatinine, Ser: 11.73 mg/dL — ABNORMAL HIGH (ref 0.61–1.24)
GFR, Estimated: 5 mL/min — ABNORMAL LOW (ref >60)
Glucose, Bld: 109 mg/dL — ABNORMAL HIGH (ref 70–99)
Potassium: 4.1 mmol/L (ref 3.5–5.1)
Sodium: 140 mmol/L (ref 135–145)

## 2021-03-26 MED ORDER — FLUTICASONE PROPIONATE 50 MCG/ACT NA SUSP: 1.0000 | Freq: Every day | NASAL | 0 refills | Status: DC

## 2021-03-26 MED ORDER — WITCH HAZEL-GLYCERIN EX PADS: MEDICATED_PAD | CUTANEOUS | 0 refills | Status: DC | PRN

## 2021-03-26 MED ORDER — CINACALCET HCL 30 MG PO TABS
90.0000 mg | ORAL_TABLET | Freq: Two times a day (BID) | ORAL | Status: DC
  Administered 2021-03-26: 90 mg via ORAL
  Filled 2021-03-26: qty 3

## 2021-03-26 MED ORDER — FERRIC CITRATE 1 GM 210 MG(FE) PO TABS
630.0000 mg | ORAL_TABLET | Freq: Three times a day (TID) | ORAL | Status: DC
  Administered 2021-03-26 (×2): 630 mg via ORAL
  Filled 2021-03-26 (×2): qty 3

## 2021-03-26 NOTE — Discharge Summary (Addendum)
Physician Discharge Summary  Jordan [name] [id number] DOB: [birth year] DOA: 03/25/2021  PCP: Pcp, No  Admit date: 03/25/2021 Discharge date: 03/26/2021  Admitted From: Home Disposition:  home   Recommendations for Outpatient Follow-up and new medication changes:  Follow up with primary care in 7 to 10 days. Follow up with HD outpatient unit on 03/27/21  Home Health: no   Equipment/Devices: no    Discharge Condition: stable  CODE STATUS: full  Diet recommendation: heart healthy and renal prudent,   Brief/Interim Summary: Jordan [name] was admitted to the hospital with the working diagnosis of volume overload due to ESRD in the setting of acute on chronic diastolic heart failure.   55 year old male past medical history for end-stage renal disease on hemodialysis, chronic heart failure, chronic anemia, hypertension and anemia of chronic kidney disease who presented with edema and dyspnea. Last hemodialysis 03/20/21, for the following 2 days he reported worsening dyspnea and lower extremity edema.  No chest pain. On his initial physical examination blood pressure 142/90, respiratory rate 24.  His lungs had rales bilaterally, increased work of breathing, heart S1-S2, present, rhythmic, abdomen soft, positive lower extremity edema.  Sodium 140, potassium 5.9, chloride 101, bicarb 18, glucose 105, BUN 124, creatinine 18.2, anion gap 21, high sensitive troponin 124-121, white count 9.7, hemoglobin 7.4, hematocrit 33.8, platelets 148. SARS COVID-19 was negative.  Chest radiograph with cardiomegaly, positive hilar vascular congestion, faint bibasilar atelectasis.  EKG 98 bpm, rightward axis, normal intervals, sinus rhythm, no ST segment or T wave changes.  Patient underwent hemodialysis with ultrafiltration with improvement of volume status.  Plan to continue hemodialysis as an outpatient 7/6.  Acute volume overload in the setting of end-stage renal disease on hemodialysis.   Hyperkalemia. Patient underwent hemodialysis with ultrafiltration with improvement of volume status. His discharge sodium is 140, potassium 4.1, chloride 99, bicarb 24, glucose 109, BUN 78, creatinine 11.7, calcium 8.6, anion gap 17.  Patient to continue hemodialysis 7/6 as an outpatient.  2.  Acute on chronic diastolic heart failure/ HTN, ejection fraction 50%. His volume status improved after ultrafiltration. Close follow up on blood pressure.   3.  Anemia of chronic kidney disease.  Hemoglobin remained stable 7.4, hematocrit 23.8. Continue outpatient follow-up.  4.  Metabolic bone disease.  Continue phosphate binders.  Discharge Diagnoses:  Principal Problem:   Volume overload Active Problems:   ESRD on dialysis [facility])   Essential hypertension   Anemia in ESRD (end-stage renal disease) (HCC)   Acute CHF (congestive heart failure) (HCC)   CHF (congestive heart failure) ([place])    Discharge Instructions   Allergies as of 03/26/2021       Reactions   Lisinopril Swelling        Medication List     STOP taking these medications    ALPRAZolam 0.25 MG tablet Commonly known as: XANAX   hydrALAZINE 50 MG tablet Commonly known as: APRESOLINE   hydrOXYzine 25 MG tablet Commonly known as: ATARAX/VISTARIL   sevelamer carbonate 800 MG tablet Commonly known as: RENVELA       TAKE these medications    Auryxia 1 GM 210 MG(Fe) tablet Generic drug: ferric citrate Take 420 mg by mouth every Monday, Wednesday, and Friday with hemodialysis.   calcium acetate 667 MG capsule Commonly known as: PHOSLO Take 2 capsules (1,334 mg total) by mouth 3 (three) times daily with meals.   cinacalcet 60 MG tablet Commonly known as: SENSIPAR SMARTSIG:2 Tablet(s) By Mouth Every Evening What changed: Another medication with  the same name was removed. Continue taking this medication, and follow the directions you see here.   epoetin alfa 4000 UNIT/ML injection Commonly known as:  EPOGEN Inject 1 mL (4,000 Units total) into the vein every Monday, Wednesday, and Friday with hemodialysis.   fluticasone 50 MCG/ACT nasal spray Commonly known as: FLONASE Place 1 spray into both nostrils daily for 15 days. Start taking on: March 27, 2021   hydrOXYzine 50 MG capsule Commonly known as: Vistaril Take 1 capsule (50 mg total) by mouth 3 (three) times daily as needed.   Lidocaine 3 % Crea Apply 1 application topically 2 (two) times daily as needed (pain).   thiamine 100 MG tablet Take 1 tablet (100 mg total) by mouth daily.   Velphoro 500 MG chewable tablet Generic drug: sucroferric oxyhydroxide Chew 500 mg by mouth 3 (three) times daily.   witch hazel-glycerin pad Commonly known as: TUCKS Apply topically as needed for itching, hemorrhoids or irritation.        Allergies  Allergen Reactions   Lisinopril Swelling    Consultations: Nephrology    Procedures/Studies: DG Chest 2 View  Result Date: 03/25/2021 CLINICAL DATA:  Chest pain for several days. EXAM: CHEST - 2 VIEW COMPARISON:  03/20/2021 FINDINGS: Stable large cardiac silhouette. Fine airspace disease similar to prior. No pleural fluid. No pneumothorax. Low lung volumes. IMPRESSION: Cardiomegaly and mild pulmonary edema pattern. Electronically Signed   By: Suzy Bouchard M.D.   On: 03/25/2021 10:08   DG Chest 2 View  Result Date: 03/20/2021 CLINICAL DATA:  Shortness of breath. EXAM: CHEST - 2 VIEW COMPARISON:  03/10/2021 FINDINGS: Coarse lung markings are similar to the prior examinations and appears to be related to chronic changes. Stable cardiomegaly. Atherosclerotic calcifications at the aortic arch. No pleural effusions. Again noted is sclerosis of the bones and compatible with chronic renal disease. IMPRESSION: 1. Stable cardiomegaly. 2. Chronic lung findings without acute changes. Electronically Signed   By: Markus Daft M.D.   On: 03/20/2021 08:14   DG Chest 2 View  Result Date:  03/10/2021 CLINICAL DATA:  Shortness of breath EXAM: CHEST - 2 VIEW COMPARISON:  03/01/2021 FINDINGS: Cardiac shadow remains enlarged. Aortic calcifications are again seen. Vascular congestion is again noted and stable. Interstitial changes are noted unchanged from the prior exam without focal infiltrate or sizable effusion. No bony abnormality is noted. IMPRESSION: Stable interstitial changes of a chronic nature. No acute abnormality is noted. Electronically Signed   By: Inez Catalina M.D.   On: 03/10/2021 22:02   DG Chest 2 View  Result Date: 03/01/2021 CLINICAL DATA:  Shortness of breath.  Dialysis patient. EXAM: CHEST - 2 VIEW COMPARISON:  February 27, 2019 FINDINGS: Stable cardiomegaly. The hila and mediastinum are unchanged. Increased interstitial markings in the lungs are similar since previous studies. No other abnormalities. IMPRESSION: Stable cardiomegaly. Stable increased interstitial markings in the lungs are chronic without overt acute edema or infiltrate. Electronically Signed   By: Dorise Bullion III M.D   On: 03/01/2021 21:50   DG Chest 2 View  Result Date: 02/26/2021 CLINICAL DATA:  Rash and shortness of breath. EXAM: CHEST - 2 VIEW COMPARISON:  Feb 01, 2021 FINDINGS: Mild, predominantly stable diffusely increased interstitial lung markings are seen. There is no evidence of a pleural effusion or pneumothorax. The cardiac silhouette is enlarged. Moderate severity calcification of the aortic arch is noted. The visualized skeletal structures are unremarkable. IMPRESSION: Stable exam without active cardiopulmonary disease. Electronically Signed   By: Hoover Browns  Houston M.D.   On: 02/26/2021 16:55   CT HEAD WO CONTRAST  Result Date: 03/11/2021 CLINICAL DATA:  55 y.o. male with medical history significant for End-stage renal disease on hemodialysis MWF, combined heart failure, hypertension, paroxysmal atrial fibrillation, COPD, noncompliance with dialysis medication, presents to the emergency  department for chief concerns of shortness of breath. EXAM: CT HEAD WITHOUT CONTRAST TECHNIQUE: Contiguous axial images were obtained from the base of the skull through the vertex without intravenous contrast. COMPARISON:  07/22/2019. FINDINGS: Brain: No evidence of acute infarction, hemorrhage, hydrocephalus, extra-axial collection or mass lesion/mass effect. Patchy areas of white matter hypoattenuation are noted consistent with mild chronic microvascular ischemic change, stable. Prominent sulcus, with possible small old infarct, right cerebellum, also stable. Vascular: No hyperdense vessel. Advanced carotid vascular calcification for age, extending into the middle cerebral arteries. Skull: Normal. Negative for fracture or focal lesion. Sinuses/Orbits: Globes and orbits are unremarkable. Dependent secretions in the visualized left maxillary sinus. Remaining sinuses are clear. Other: None. IMPRESSION: 1. No acute intracranial abnormalities. 2. Mild chronic microvascular ischemic change. 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 Electronically Signed   By: Lajean Manes M.D.   On: 03/11/2021 18:37   CT Angio Chest PE W and/or Wo Contrast  Result Date: 02/26/2021 CLINICAL DATA:  Shortness of breath and leg swelling. Dialysis patient. EXAM: CT ANGIOGRAPHY CHEST WITH CONTRAST TECHNIQUE: Multidetector CT imaging of the chest was performed using the standard protocol during bolus administration of intravenous contrast. Multiplanar CT image reconstructions and MIPs were obtained to evaluate the vascular anatomy. CONTRAST:  57m OMNIPAQUE IOHEXOL 350 MG/ML SOLN COMPARISON:  Chest radiographs obtained earlier today. Chest CTA dated 11/12/2019 FINDINGS: Cardiovascular: Stable enlarged heart. Interval small pericardial effusion with a maximum thickness of 7 mm. Atheromatous calcifications, including the coronary arteries and aorta. Normally opacified pulmonary arteries with no pulmonary arterial filling defects seen.  Mediastinum/Nodes: No enlarged mediastinal, hilar, or axillary lymph nodes. Thyroid gland, trachea, and esophagus demonstrate no significant findings. Lungs/Pleura: Stable diffuse prominence of the interstitial markings with mild centrilobular bullous changes, most pronounced in the right upper lobe. No interval airspace consolidation or pleural fluid. Upper Abdomen: Interval small to moderate amount of free peritoneal fluid. Musculoskeletal: Diffuse bone sclerosis without significant change. Thoracic and lower cervical spine degenerative changes. Review of the MIP images confirms the above findings. IMPRESSION: 1. No pulmonary emboli. 2. Interval small pericardial effusion. 3. Interval small to moderate amount of ascites. 4.  Calcific coronary artery and aortic atherosclerosis. 5. 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 6. 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 Aortic Atherosclerosis (ICD10-I70.0) and Emphysema (ICD10-J43.9). Electronically Signed   By: SClaudie ReveringM.D.   On: 02/26/2021 21:26   MR BRAIN WO CONTRAST  Result Date: 03/21/2021 CLINICAL DATA:  Monocular left eye vision loss. EXAM: MRI HEAD AND ORBITS WITHOUT CONTRAST TECHNIQUE: Multiplanar, multi-echo pulse sequences of the brain and surrounding structures were acquired without intravenous contrast. Multiplanar, multi-echo pulse sequences of the orbits and surrounding structures were acquired including fat saturation techniques, without intravenous contrast administration. COMPARISON:  CT head March 11, 2021. FINDINGS: Significantly motion limited and incomplete study with patient refusing contrast. Within this limitation: MRI HEAD FINDINGS Brain: No acute infarction, hemorrhage, hydrocephalus, extra-axial collection or mass lesion. Age advanced T2/FLAIR hyperintensities in the white matter and pons. Remote bilateral cerebellar lacunar infarcts. Vascular: Major arterial flow voids  are maintained at the skull base. Skull and upper cervical spine: Diffuse T1 hypointensity of the  marrow, most likely related to the patient's known anemia. Other: Large right and small left mastoid effusions. MRI ORBITS FINDINGS Orbits: Limited evaluation without obvious orbital mass or evidence of inflammation. Grossly normal appearance of the globes, optic nerve-sheath complexes, and lacrimal glands. Bilateral exophthalmos. Prominent retro bulbar fat with mildly enlarged extraocular muscles with sparing of the tendinous junction (for example see series 12, image 24). Visualized sinuses: Clear sinuses. Soft tissues: Normal. IMPRESSION: MRI Head: 1. Significantly motion limited and incomplete study with the patient refusing contrast. Within this limitation, no evidence of acute intracranial abnormality. No evidence of acute infarct on the diagnostic diffusion imaging. 2. Age advanced T2/FLAIR hyperintensities within the white matter, most likely related to chronic microvascular ischemic disease given the patient's risk factors (including CKD and hypertension). Demyelinating disease could have a similar appearance. 3. Remote bilateral cerebellar lacunar infarcts. 4. Large right and small left mastoid effusions. MRI Orbits: 1. Prominent retrobulbar fat, bilateral exophthalmos, and mildly enlarged extraocular muscles bilaterally with sparing of the tendinous junction. Constellation of findings can be seen with thyroid-associated orbitopathy. Recommend correlation with TSH. 2. Otherwise, no evidence of acute orbital abnormality on this motion limited noncontrast study. Electronically Signed   By: Margaretha Sheffield MD   On: 03/21/2021 12:23   ECHOCARDIOGRAM COMPLETE  Result Date: 03/02/2021    ECHOCARDIOGRAM REPORT   Patient Name:   Jordan [name] Date of Exam: 03/02/2021 Medical Rec #:  [id number]        Height:       64.0 in Accession #:    [id number]       Weight:       156.0 lb Date of Birth:  1966-07-29         BSA:          1.760 m Patient Age:    39 years         BP:           157/105 mmHg Patient Gender: M                HR:           91 bpm. Exam Location:  [facility] Procedure: 2D Echo Indications:     CHF I50.21  History:         Patient has prior history of Echocardiogram examinations, most                  recent 04/05/2020.  Sonographer:     Kathlen Brunswick RDCS Referring Phys:  [id number] Athena Masse Diagnosing Phys: Kate Sable MD IMPRESSIONS  1. Left ventricular ejection fraction, by estimation, is 50%. The left ventricle has low normal function. The left ventricle has no regional wall motion abnormalities. There is mild left ventricular hypertrophy. Left ventricular diastolic parameters are  consistent with Grade II diastolic dysfunction (pseudonormalization).  2. Right ventricular systolic function is mildly reduced. The right ventricular size is mildly enlarged. There is severely elevated pulmonary artery systolic pressure. The estimated right ventricular systolic pressure is 123456 mmHg.  3. Left atrial size was mildly dilated.  4. Right atrial size was mildly dilated.  5. The mitral valve is degenerative. Mild mitral valve regurgitation.  6. Tricuspid valve regurgitation is moderate.  7. The aortic valve is tricuspid. Aortic valve regurgitation is not visualized. Mild aortic valve sclerosis is present, with no evidence of aortic valve stenosis.  8. The inferior vena cava is dilated in size with <50% respiratory variability, suggesting right atrial pressure of 15 mmHg.  FINDINGS  Left Ventricle: Left ventricular ejection fraction, by estimation, is 50%. The left ventricle has low normal function. The left ventricle has no regional wall motion abnormalities. The left ventricular internal cavity size was normal in size. There is mild left ventricular hypertrophy. Left ventricular diastolic parameters are consistent with Grade II diastolic dysfunction (pseudonormalization). Right Ventricle: The right ventricular  size is mildly enlarged. No increase in right ventricular wall thickness. Right ventricular systolic function is mildly reduced. There is severely elevated pulmonary artery systolic pressure. The tricuspid regurgitant velocity is 3.68 m/s, and with an assumed right atrial pressure of 15 mmHg, the estimated right ventricular systolic pressure is 123456 mmHg. Left Atrium: Left atrial size was mildly dilated. Right Atrium: Right atrial size was mildly dilated. Pericardium: There is no evidence of pericardial effusion. Mitral Valve: The mitral valve is degenerative in appearance. Mild mitral valve regurgitation. Tricuspid Valve: The tricuspid valve is normal in structure. Tricuspid valve regurgitation is moderate. Aortic Valve: The aortic valve is tricuspid. Aortic valve regurgitation is not visualized. Mild aortic valve sclerosis is present, with no evidence of aortic valve stenosis. Aortic valve peak gradient measures 13.0 mmHg. Pulmonic Valve: The pulmonic valve was normal in structure. Pulmonic valve regurgitation is not visualized. Aorta: The aortic root is normal in size and structure. Venous: The inferior vena cava is dilated in size with less than 50% respiratory variability, suggesting right atrial pressure of 15 mmHg. IAS/Shunts: No atrial level shunt detected by color flow Doppler.  LEFT VENTRICLE PLAX 2D LVIDd:         4.97 cm  Diastology LVIDs:         3.51 cm  LV e' medial:    6.53 cm/s LV PW:         1.29 cm  LV E/e' medial:  23.3 LV IVS:        1.15 cm  LV e' lateral:   9.46 cm/s LVOT diam:     1.90 cm  LV E/e' lateral: 16.1 LV SV:         52 LV SV Index:   30 LVOT Area:     2.84 cm  RIGHT VENTRICLE RV Basal diam:  4.32 cm RV S prime:     12.00 cm/s TAPSE (M-mode): 1.4 cm LEFT ATRIUM             Index       RIGHT ATRIUM           Index LA diam:        5.20 cm 2.95 cm/m  RA Area:     19.80 cm LA Vol (A2C):   92.4 ml 52.49 ml/m RA Volume:   61.00 ml  34.65 ml/m LA Vol (A4C):   55.6 ml 31.59 ml/m LA  Biplane Vol: 71.8 ml 40.79 ml/m  AORTIC VALVE                PULMONIC VALVE AV Area (Vmax): 1.64 cm    PV Vmax:       1.10 m/s AV Vmax:        180.00 cm/s PV Peak grad:  4.8 mmHg AV Peak Grad:   13.0 mmHg LVOT Vmax:      104.00 cm/s LVOT Vmean:     70.000 cm/s LVOT VTI:       0.184 m  AORTA Ao Root diam: 2.90 cm Ao Asc diam:  2.80 cm MITRAL VALVE  TRICUSPID VALVE MV Area (PHT): 6.12 cm     TV Peak grad:   47.8 mmHg MV Decel Time: 124 msec     TV Vmax:        3.46 m/s MV E velocity: 152.00 cm/s  TR Peak grad:   54.2 mmHg MV A velocity: 64.90 cm/s   TR Vmax:        368.00 cm/s MV E/A ratio:  2.34                             SHUNTS                             Systemic VTI:  0.18 m                             Systemic Diam: 1.90 cm Kate Sable MD Electronically signed by Kate Sable MD Signature Date/Time: 03/02/2021/2:54:39 PM    Final    MR ORBITS WO CONTRAST  Result Date: 03/21/2021 CLINICAL DATA:  Monocular left eye vision loss. EXAM: MRI HEAD AND ORBITS WITHOUT CONTRAST TECHNIQUE: Multiplanar, multi-echo pulse sequences of the brain and surrounding structures were acquired without intravenous contrast. Multiplanar, multi-echo pulse sequences of the orbits and surrounding structures were acquired including fat saturation techniques, without intravenous contrast administration. COMPARISON:  CT head March 11, 2021. FINDINGS: Significantly motion limited and incomplete study with patient refusing contrast. Within this limitation: MRI HEAD FINDINGS Brain: No acute infarction, hemorrhage, hydrocephalus, extra-axial collection or mass lesion. Age advanced T2/FLAIR hyperintensities in the white matter and pons. Remote bilateral cerebellar lacunar infarcts. Vascular: Major arterial flow voids are maintained at the skull base. Skull and upper cervical spine: Diffuse T1 hypointensity of the marrow, most likely related to the patient's known anemia. Other: Large right and small left mastoid  effusions. MRI ORBITS FINDINGS Orbits: Limited evaluation without obvious orbital mass or evidence of inflammation. Grossly normal appearance of the globes, optic nerve-sheath complexes, and lacrimal glands. Bilateral exophthalmos. Prominent retro bulbar fat with mildly enlarged extraocular muscles with sparing of the tendinous junction (for example see series 12, image 24). Visualized sinuses: Clear sinuses. Soft tissues: Normal. IMPRESSION: MRI Head: 1. Significantly motion limited and incomplete study with the patient refusing contrast. Within this limitation, no evidence of acute intracranial abnormality. No evidence of acute infarct on the diagnostic diffusion imaging. 2. Age advanced T2/FLAIR hyperintensities within the white matter, most likely related to chronic microvascular ischemic disease given the patient's risk factors (including CKD and hypertension). Demyelinating disease could have a similar appearance. 3. Remote bilateral cerebellar lacunar infarcts. 4. Large right and small left mastoid effusions. MRI Orbits: 1. Prominent retrobulbar fat, bilateral exophthalmos, and mildly enlarged extraocular muscles bilaterally with sparing of the tendinous junction. Constellation of findings can be seen with thyroid-associated orbitopathy. Recommend correlation with TSH. 2. Otherwise, no evidence of acute orbital abnormality on this motion limited noncontrast study. Electronically Signed   By: Margaretha Sheffield MD   On: 03/21/2021 12:23     Subjective: Patient is feeling better, dyspnea and edema have improved, no chest pain, no nausea or vomiting.   Discharge Exam: Vitals:   03/26/21 0756 03/26/21 1118  BP: 136/88 (!) 149/98  Pulse:  91  Resp:  16  Temp: 98.1 F (36.7 C) 98.1 F (36.7 C)  SpO2:  94%   Vitals:   03/26/21  0005 03/26/21 0505 03/26/21 0756 03/26/21 1118  BP:  120/86 136/88 (!) 149/98  Pulse:  86  91  Resp:  18  16  Temp:  98.2 F (36.8 C) 98.1 F (36.7 C) 98.1 F (36.7 C)   TempSrc:  Oral Oral Oral  SpO2:  96%  94%  Weight: 75.7 kg     Height:        General: Not in pain or dyspnea,  Neurology: Awake and alert, non focal  E ENT: no pallor, no icterus, oral mucosa moist Cardiovascular: No JVD. S1-S2 present, rhythmic, no gallops, rubs, or murmurs. No lower extremity edema. Pulmonary: positive breath sounds bilaterally, adequate air movement, no wheezing, rhonchi or rales. Gastrointestinal. Abdomen soft and non tender Skin. No rashes Musculoskeletal: no joint deformities   The results of significant diagnostics from this hospitalization (including imaging, microbiology, ancillary and laboratory) are listed below for reference.     Microbiology: Recent Results (from the past 240 hour(s))  Resp Panel by RT-PCR (Flu A&B, Covid) Nasopharyngeal Swab     Status: None   Collection Time: 03/20/21 10:51 AM   Specimen: Nasopharyngeal Swab; Nasopharyngeal(NP) swabs in vial transport medium  Result Value Ref Range Status   SARS Coronavirus 2 by RT PCR NEGATIVE NEGATIVE Final    Comment: (NOTE) SARS-CoV-2 target nucleic acids are NOT DETECTED.  The SARS-CoV-2 RNA is generally detectable in upper respiratory specimens during the acute phase of infection. The lowest concentration of SARS-CoV-2 viral copies this assay can detect is 138 copies/mL. A negative result does not preclude SARS-Cov-2 infection and should not be used as the sole basis for treatment or other patient management decisions. A negative result may occur with  improper specimen collection/handling, submission of specimen other than nasopharyngeal swab, presence of viral mutation(s) within the areas targeted by this assay, and inadequate number of viral copies(<138 copies/mL). A negative result must be combined with clinical observations, patient history, and epidemiological information. The expected result is Negative.  Fact Sheet for Patients:   EntrepreneurPulse.com.au  Fact Sheet for Healthcare Providers:  IncredibleEmployment.be  This test is no t yet approved or cleared by the [place] FDA and  has been authorized for detection and/or diagnosis of SARS-CoV-2 by FDA under an Emergency Use Authorization (EUA). This EUA will remain  in effect (meaning this test can be used) for the duration of the COVID-19 declaration under Section 564(b)(1) of the Act, 21 U.S.C.section 360bbb-3(b)(1), unless the authorization is terminated  or revoked sooner.       Influenza A by PCR NEGATIVE NEGATIVE Final   Influenza B by PCR NEGATIVE NEGATIVE Final    Comment: (NOTE) The Xpert Xpress SARS-CoV-2/FLU/RSV plus assay is intended as an aid in the diagnosis of influenza from Nasopharyngeal swab specimens and should not be used as a sole basis for treatment. Nasal washings and aspirates are unacceptable for Xpert Xpress SARS-CoV-2/FLU/RSV testing.  Fact Sheet for Patients: EntrepreneurPulse.com.au  Fact Sheet for Healthcare Providers: IncredibleEmployment.be  This test is not yet approved or cleared by the [place] FDA and has been authorized for detection and/or diagnosis of SARS-CoV-2 by FDA under an Emergency Use Authorization (EUA). This EUA will remain in effect (meaning this test can be used) for the duration of the COVID-19 declaration under Section 564(b)(1) of the Act, 21 U.S.C. section 360bbb-3(b)(1), unless the authorization is terminated or revoked.  Performed at [facility], [address] [place], [place] [address]   Resp Panel by RT-PCR (Flu A&B, Covid) Nasopharyngeal  Swab     Status: None   Collection Time: 03/25/21  1:07 PM   Specimen: Nasopharyngeal Swab; Nasopharyngeal(NP) swabs in vial transport medium  Result Value Ref Range Status   SARS Coronavirus 2 by RT PCR NEGATIVE NEGATIVE Final    Comment: (NOTE) SARS-CoV-2  target nucleic acids are NOT DETECTED.  The SARS-CoV-2 RNA is generally detectable in upper respiratory specimens during the acute phase of infection. The lowest concentration of SARS-CoV-2 viral copies this assay can detect is 138 copies/mL. A negative result does not preclude SARS-Cov-2 infection and should not be used as the sole basis for treatment or other patient management decisions. A negative result may occur with  improper specimen collection/handling, submission of specimen other than nasopharyngeal swab, presence of viral mutation(s) within the areas targeted by this assay, and inadequate number of viral copies(<138 copies/mL). A negative result must be combined with clinical observations, patient history, and epidemiological information. The expected result is Negative.  Fact Sheet for Patients:  EntrepreneurPulse.com.au  Fact Sheet for Healthcare Providers:  IncredibleEmployment.be  This test is no t yet approved or cleared by the [place] FDA and  has been authorized for detection and/or diagnosis of SARS-CoV-2 by FDA under an Emergency Use Authorization (EUA). This EUA will remain  in effect (meaning this test can be used) for the duration of the COVID-19 declaration under Section 564(b)(1) of the Act, 21 U.S.C.section 360bbb-3(b)(1), unless the authorization is terminated  or revoked sooner.       Influenza A by PCR NEGATIVE NEGATIVE Final   Influenza B by PCR NEGATIVE NEGATIVE Final    Comment: (NOTE) The Xpert Xpress SARS-CoV-2/FLU/RSV plus assay is intended as an aid in the diagnosis of influenza from Nasopharyngeal swab specimens and should not be used as a sole basis for treatment. Nasal washings and aspirates are unacceptable for Xpert Xpress SARS-CoV-2/FLU/RSV testing.  Fact Sheet for Patients: EntrepreneurPulse.com.au  Fact Sheet for Healthcare  Providers: IncredibleEmployment.be  This test is not yet approved or cleared by the [place] FDA and has been authorized for detection and/or diagnosis of SARS-CoV-2 by FDA under an Emergency Use Authorization (EUA). This EUA will remain in effect (meaning this test can be used) for the duration of the COVID-19 declaration under Section 564(b)(1) of the Act, 21 U.S.C. section 360bbb-3(b)(1), unless the authorization is terminated or revoked.  Performed at [facility], [place] [address], [place], [place] [address]      Labs: BNP (last 3 results) Recent Labs    02/26/21 1700 03/10/21 2128  BNP 2,730.3* [id number]*   Basic Metabolic Panel: Recent Labs  Lab 03/20/21 0740 03/21/21 0553 03/25/21 0955 03/25/21 1933 03/26/21 0248  NA 146* 142 140 142 140  K 4.8 4.5 5.9* 6.3* 4.1  CL 105 100 101 101 99  CO2 19* 24 18* 19* 24  GLUCOSE 124* 134* 105* 80 109*  BUN 96* 68* 124* 131* 72*  CREATININE 18.44* 12.87* 18.24* 18.57* 11.73*  CALCIUM 9.5 9.4 9.2 9.1 8.6*  PHOS  --  6.7*  --  10.9*  --    Liver Function Tests: No results for input(s): AST, ALT, ALKPHOS, BILITOT, PROT, ALBUMIN in the last 168 hours. No results for input(s): LIPASE, AMYLASE in the last 168 hours. No results for input(s): AMMONIA in the last 168 hours. CBC: Recent Labs  Lab 03/20/21 0740 03/21/21 0553 03/25/21 0955  WBC 7.3 6.5 9.7  HGB 7.4* 7.4* 7.4*  HCT 22.8* 23.3* 23.8*  MCV 100.4* 100.9* 106.7*  PLT 123* 106* 148*   Cardiac Enzymes: No results for input(s): CKTOTAL, CKMB, CKMBINDEX, TROPONINI in the last 168 hours. BNP: Invalid input(s): POCBNP CBG: No results for input(s): GLUCAP in the last 168 hours. D-Dimer No results for input(s): DDIMER in the last 72 hours. Hgb A1c No results for input(s): HGBA1C in the last 72 hours. Lipid Profile No results for input(s): CHOL, HDL, LDLCALC, TRIG, CHOLHDL, LDLDIRECT in the last 72 hours. Thyroid function studies Recent  Labs    03/25/21 1355  TSH 2.266   Anemia work up No results for input(s): VITAMINB12, FOLATE, FERRITIN, TIBC, IRON, RETICCTPCT in the last 72 hours. Urinalysis    Component Value Date/Time   COLORURINE YELLOW (A) 01/13/2021 0153   APPEARANCEUR CLEAR (A) 01/13/2021 0153   LABSPEC 1.012 01/13/2021 0153   PHURINE 9.0 (H) 01/13/2021 0153   GLUCOSEU 150 (A) 01/13/2021 0153   HGBUR NEGATIVE 01/13/2021 0153   BILIRUBINUR NEGATIVE 01/13/2021 0153   KETONESUR NEGATIVE 01/13/2021 0153   PROTEINUR >=300 (A) 01/13/2021 0153   NITRITE NEGATIVE 01/13/2021 0153   LEUKOCYTESUR NEGATIVE 01/13/2021 0153   Sepsis Labs Invalid input(s): PROCALCITONIN,  WBC,  LACTICIDVEN Microbiology Recent Results (from the past 240 hour(s))  Resp Panel by RT-PCR (Flu A&B, Covid) Nasopharyngeal Swab     Status: None   Collection Time: 03/20/21 10:51 AM   Specimen: Nasopharyngeal Swab; Nasopharyngeal(NP) swabs in vial transport medium  Result Value Ref Range Status   SARS Coronavirus 2 by RT PCR NEGATIVE NEGATIVE Final    Comment: (NOTE) SARS-CoV-2 target nucleic acids are NOT DETECTED.  The SARS-CoV-2 RNA is generally detectable in upper respiratory specimens during the acute phase of infection. The lowest concentration of SARS-CoV-2 viral copies this assay can detect is 138 copies/mL. A negative result does not preclude SARS-Cov-2 infection and should not be used as the sole basis for treatment or other patient management decisions. A negative result may occur with  improper specimen collection/handling, submission of specimen other than nasopharyngeal swab, presence of viral mutation(s) within the areas targeted by this assay, and inadequate number of viral copies(<138 copies/mL). A negative result must be combined with clinical observations, patient history, and epidemiological information. The expected result is Negative.  Fact Sheet for Patients:  EntrepreneurPulse.com.au  Fact  Sheet for Healthcare Providers:  IncredibleEmployment.be  This test is no t yet approved or cleared by the [place] FDA and  has been authorized for detection and/or diagnosis of SARS-CoV-2 by FDA under an Emergency Use Authorization (EUA). This EUA will remain  in effect (meaning this test can be used) for the duration of the COVID-19 declaration under Section 564(b)(1) of the Act, 21 U.S.C.section 360bbb-3(b)(1), unless the authorization is terminated  or revoked sooner.       Influenza A by PCR NEGATIVE NEGATIVE Final   Influenza B by PCR NEGATIVE NEGATIVE Final    Comment: (NOTE) The Xpert Xpress SARS-CoV-2/FLU/RSV plus assay is intended as an aid in the diagnosis of influenza from Nasopharyngeal swab specimens and should not be used as a sole basis for treatment. Nasal washings and aspirates are unacceptable for Xpert Xpress SARS-CoV-2/FLU/RSV testing.  Fact Sheet for Patients: EntrepreneurPulse.com.au  Fact Sheet for Healthcare Providers: IncredibleEmployment.be  This test is not yet approved or cleared by the [place] FDA and has been authorized for detection and/or diagnosis of SARS-CoV-2 by FDA under an Emergency Use Authorization (EUA). This EUA will remain in effect (meaning this test can be used) for the duration of the COVID-19 declaration  under Section 564(b)(1) of the Act, 21 U.S.C. section 360bbb-3(b)(1), unless the authorization is terminated or revoked.  Performed at [facility], [address] [place], [place] [address]   Resp Panel by RT-PCR (Flu A&B, Covid) Nasopharyngeal Swab     Status: None   Collection Time: 03/25/21  1:07 PM   Specimen: Nasopharyngeal Swab; Nasopharyngeal(NP) swabs in vial transport medium  Result Value Ref Range Status   SARS Coronavirus 2 by RT PCR NEGATIVE NEGATIVE Final    Comment: (NOTE) SARS-CoV-2 target nucleic acids are NOT DETECTED.  The  SARS-CoV-2 RNA is generally detectable in upper respiratory specimens during the acute phase of infection. The lowest concentration of SARS-CoV-2 viral copies this assay can detect is 138 copies/mL. A negative result does not preclude SARS-Cov-2 infection and should not be used as the sole basis for treatment or other patient management decisions. A negative result may occur with  improper specimen collection/handling, submission of specimen other than nasopharyngeal swab, presence of viral mutation(s) within the areas targeted by this assay, and inadequate number of viral copies(<138 copies/mL). A negative result must be combined with clinical observations, patient history, and epidemiological information. The expected result is Negative.  Fact Sheet for Patients:  EntrepreneurPulse.com.au  Fact Sheet for Healthcare Providers:  IncredibleEmployment.be  This test is no t yet approved or cleared by the [place] FDA and  has been authorized for detection and/or diagnosis of SARS-CoV-2 by FDA under an Emergency Use Authorization (EUA). This EUA will remain  in effect (meaning this test can be used) for the duration of the COVID-19 declaration under Section 564(b)(1) of the Act, 21 U.S.C.section 360bbb-3(b)(1), unless the authorization is terminated  or revoked sooner.       Influenza A by PCR NEGATIVE NEGATIVE Final   Influenza B by PCR NEGATIVE NEGATIVE Final    Comment: (NOTE) The Xpert Xpress SARS-CoV-2/FLU/RSV plus assay is intended as an aid in the diagnosis of influenza from Nasopharyngeal swab specimens and should not be used as a sole basis for treatment. Nasal washings and aspirates are unacceptable for Xpert Xpress SARS-CoV-2/FLU/RSV testing.  Fact Sheet for Patients: EntrepreneurPulse.com.au  Fact Sheet for Healthcare Providers: IncredibleEmployment.be  This test is not yet approved or  cleared by the [place] FDA and has been authorized for detection and/or diagnosis of SARS-CoV-2 by FDA under an Emergency Use Authorization (EUA). This EUA will remain in effect (meaning this test can be used) for the duration of the COVID-19 declaration under Section 564(b)(1) of the Act, 21 U.S.C. section 360bbb-3(b)(1), unless the authorization is terminated or revoked.  Performed at [facility], [place] [address], [place], [place] [address]      Time coordinating discharge: 45 minutes  SIGNED:   Tawni Millers, MD  [facility] 03/26/2021, 3:14 PM

## 2021-03-26 NOTE — Consult Note (Signed)
   [facility] CM Inpatient Consult   03/26/2021  Jordan [name] 24-Jun-1966 [id number]   [place] [facility] [ACO] Patient: [employer]   Reason:  Does not have a [facility] primary Care provider, not eligible for [facility] Care Management program, was dismissed from practice noted. Confirmed patient from [facility] and to return.  Will sign off.   For additional questions or referrals please contact:    For questions, please call:  Natividad Brood, RN BSN [facility] Liaison  [phone] business mobile phone Toll free office [phone]  Fax number: [phone] [email or web address]

## 2021-03-26 NOTE — Plan of Care (Signed)

## 2021-03-26 NOTE — TOC Transition Note (Addendum)
Transition of Care [facility]) - CM/SW Discharge Note   Patient Details  Name: Jordan [name] MRN: [id number] Date of Birth: 04-07-1966  [facility] CM/SW Contact:  Zenon Mayo, RN Phone Number: 03/26/2021, 3:47 PM   Clinical Narrative:    Patient is from [facility]  He is for dc back to [facility] today.  NCM spoke with the DO , Ms Jimmye Norman.  She states to fax the dc summary to [phone].  NCM faxed information. Ms Jimmye Norman states she will have to get him set up for his dialysis tomorrow at [facility].  Per Nehphrology  Next HD 7/6, hopefully at his OP clinic. BUN much improved. D/c PO bicarb. Per Ms Jimmye Norman, we can send patient back to facility.  NCM notified Staff RN.  Final next level [facility] care: [facility] Barriers to Discharge: No Barriers Identified   Patient Goals and CMS Choice Patient states their goals for this hospitalization and ongoing recovery are:: return to [facility]   Choice offered to / list presented to : NA  Discharge Placement                       Discharge Plan and Services                  DME Agency: NA       HH Arranged: NA          Social Determinants of Health (SDOH) Interventions     Readmission Risk Interventions No flowsheet data found.

## 2021-03-26 NOTE — Progress Notes (Signed)
[facility] Progress Note   Subjective:   Seen in room - sleeping soundly, but wakes for questions. Got ~2.5L UF off with HD overnight, signed off early d/t cramping. Remains overloaded, but without overt dyspnea this morning and labs look a lot better.  Objective Vitals:   03/25/21 2357 03/26/21 0005 03/26/21 0505 03/26/21 0756  BP: 117/74  120/86 136/88  Pulse: 97  86   Resp: 18  18   Temp: 97.7 F (36.5 C)  98.2 F (36.8 C) 98.1 F (36.7 C)  TempSrc: Oral  Oral Oral  SpO2: 93%  96%   Weight:  75.7 kg    Height:       Physical Exam General: Chronically ill appearing man, NAD. + facial edema Heart: RRR; 2/6 murmur Lungs: Dull bases, intermittent rhonchi Extremities: 3+ BLE edema to knees Dialysis Access: LUE AVF + bruit  Additional Objective Labs: Basic Metabolic Panel: Recent Labs  Lab 03/21/21 0553 03/25/21 0955 03/25/21 1933 03/26/21 0248  NA 142 140 142 140  K 4.5 5.9* 6.3* 4.1  CL 100 101 101 99  CO2 24 18* 19* 24  GLUCOSE 134* 105* 80 109*  BUN 68* 124* 131* 72*  CREATININE 12.87* 18.24* 18.57* 11.73*  CALCIUM 9.4 9.2 9.1 8.6*  PHOS 6.7*  --  10.9*  --     CBC: Recent Labs  Lab 03/20/21 0740 03/21/21 0553 03/25/21 0955  WBC 7.3 6.5 9.7  HGB 7.4* 7.4* 7.4*  HCT 22.8* 23.3* 23.8*  MCV 100.4* 100.9* 106.7*  PLT 123* 106* 148*   Blood Culture    Component Value Date/Time   SDES BLOOD RIGHT ANTECUBITAL 01/13/2021 0246   SPECREQUEST  01/13/2021 0246    BOTTLES DRAWN AEROBIC AND ANAEROBIC Blood Culture adequate volume   CULT  01/13/2021 0246    NO GROWTH 5 DAYS Performed at [facility], [address] [place], [place] [address]    REPTSTATUS 01/18/2021 FINAL 01/13/2021 0246   Studies/Results: DG Chest 2 View  Result Date: 03/25/2021 CLINICAL DATA:  Chest pain for several days. EXAM: CHEST - 2 VIEW COMPARISON:  03/20/2021 FINDINGS: Stable large cardiac silhouette. Fine airspace disease similar to prior. No pleural  fluid. No pneumothorax. Low lung volumes. IMPRESSION: Cardiomegaly and mild pulmonary edema pattern. Electronically Signed   By: Suzy Bouchard M.D.   On: 03/25/2021 10:08   Medications:  sodium chloride      sodium chloride   Intravenous Once   calcium acetate  1,334 mg Oral TID WC   carvedilol  3.125 mg Oral BID WC   cinacalcet  60 mg Oral BID WC   darbepoetin (ARANESP) injection - DIALYSIS  100 mcg Intravenous Q Mon-HD   ferric citrate  420 mg Oral Q M,W,F-HD   fluticasone  1 spray Each Nare Daily   gabapentin  100 mg Oral BID   heparin  5,000 Units Subcutaneous Q8H   polyethylene glycol  17 g Oral Daily   senna-docusate  1 tablet Oral QHS   sodium bicarbonate  650 mg Oral BID   sodium chloride flush  3 mL Intravenous Q12H   sodium zirconium cyclosilicate  10 g Oral TID   sucroferric oxyhydroxide  500 mg Oral TID   thiamine  100 mg Oral Daily    Dialysis Orders: MWF at [facility] - followed by our group ([facility]) 4hr, 450/800, EDW 67kg, 2K/2Ca, LUE AVF, no heparin - Mircera 252mg Iv q 2 weeks (last got 560m on 5/27) - Calcitriol 1.84m3mPO  q HD - Supposed to take PO Auryxia as binder, PO sensipar 180 QHS at home - doubt compliance.   Assessment/Plan:  Volume/overload: Remains edematous, but dyspnea improved. Needs better HD compliance to combat this fully. Hyperkalemia: Resolved with HD and Lokelma.  ESRD:  Usual MWF schedule -- ongoing compliance issues which he attributes to severe itching while on HD, signs off early almost every HD. Next HD 7/6, hopefully at his OP clinic. BUN much improved. D/c PO bicarb. Hypertension/volume: BP much better, still with signif edema. UF as tolerated.  Anemia: Hgb 7.4 - Aranesp '100mg'$  given 7/4, follow.  Metabolic bone disease: Ca ok, Phos high -> resume home meds.  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 TSH normal.  Veneta Penton,  PA-C 03/26/2021, 9:32 AM  [facility]

## 2021-03-27 LAB — TYPE AND SCREEN
ABO/RH(D): O NEG
Antibody Screen: POSITIVE
DAT, IgG: NEGATIVE
Donor AG Type: NEGATIVE
Donor AG Type: NEGATIVE
Unit division: 0
Unit division: 0

## 2021-03-27 LAB — BPAM RBC
Blood Product Expiration Date: 202207072359
Blood Product Expiration Date: 202207162359
ISSUE DATE / TIME: 202207041736
Unit Type and Rh: 9500
Unit Type and Rh: 9500

## 2021-03-28 ENCOUNTER — Other Ambulatory Visit: Payer: Self-pay

## 2021-03-28 ENCOUNTER — Emergency Department (HOSPITAL_COMMUNITY): Payer: Medicare Other

## 2021-03-28 ENCOUNTER — Emergency Department (HOSPITAL_COMMUNITY)
Admission: EM | Admit: 2021-03-28 | Discharge: 2021-03-28 | Disposition: A | Discharge status: Home / Self Care | Payer: Medicare Other | Attending: Emergency Medicine | Admitting: Emergency Medicine

## 2021-03-28 ENCOUNTER — Encounter (HOSPITAL_COMMUNITY): Payer: Self-pay | Admitting: Emergency Medicine

## 2021-03-28 DIAGNOSIS — I5043 Acute on chronic combined systolic (congestive) and diastolic (congestive) heart failure: Secondary | ICD-10-CM | POA: Insufficient documentation

## 2021-03-28 DIAGNOSIS — M7989 Other specified soft tissue disorders: Secondary | ICD-10-CM | POA: Insufficient documentation

## 2021-03-28 DIAGNOSIS — Z20822 Contact with and (suspected) exposure to covid-19: Secondary | ICD-10-CM | POA: Diagnosis not present

## 2021-03-28 DIAGNOSIS — R1909 Other intra-abdominal and pelvic swelling, mass and lump: Secondary | ICD-10-CM | POA: Insufficient documentation

## 2021-03-28 DIAGNOSIS — N186 End stage renal disease: Secondary | ICD-10-CM | POA: Insufficient documentation

## 2021-03-28 DIAGNOSIS — I132 Hypertensive heart and chronic kidney disease with heart failure and with stage 5 chronic kidney disease, or end stage renal disease: Secondary | ICD-10-CM | POA: Insufficient documentation

## 2021-03-28 DIAGNOSIS — R059 Cough, unspecified: Secondary | ICD-10-CM | POA: Insufficient documentation

## 2021-03-28 DIAGNOSIS — Z992 Dependence on renal dialysis: Secondary | ICD-10-CM | POA: Diagnosis not present

## 2021-03-28 DIAGNOSIS — D631 Anemia in chronic kidney disease: Secondary | ICD-10-CM | POA: Diagnosis not present

## 2021-03-28 DIAGNOSIS — E8771 Transfusion associated circulatory overload: Secondary | ICD-10-CM | POA: Diagnosis not present

## 2021-03-28 DIAGNOSIS — R220 Localized swelling, mass and lump, head: Secondary | ICD-10-CM | POA: Diagnosis not present

## 2021-03-28 DIAGNOSIS — F17210 Nicotine dependence, cigarettes, uncomplicated: Secondary | ICD-10-CM | POA: Insufficient documentation

## 2021-03-28 DIAGNOSIS — R0602 Shortness of breath: Secondary | ICD-10-CM | POA: Diagnosis present

## 2021-03-28 LAB — BASIC METABOLIC PANEL
Anion gap: 20 — ABNORMAL HIGH (ref 5–15)
BUN: 93 mg/dL — ABNORMAL HIGH (ref 6–20)
CO2: 22 mmol/L (ref 22–32)
Calcium: 7.7 mg/dL — ABNORMAL LOW (ref 8.9–10.3)
Chloride: 99 mmol/L (ref 98–111)
Creatinine, Ser: 13.85 mg/dL — ABNORMAL HIGH (ref 0.61–1.24)
GFR, Estimated: 4 mL/min — ABNORMAL LOW (ref >60)
Glucose, Bld: 114 mg/dL — ABNORMAL HIGH (ref 70–99)
Potassium: 4.2 mmol/L (ref 3.5–5.1)
Sodium: 141 mmol/L (ref 135–145)

## 2021-03-28 LAB — CBC WITH DIFFERENTIAL/PLATELET
Abs Immature Granulocytes: 0.29 10*3/uL — ABNORMAL HIGH (ref 0.00–0.07)
Basophils Absolute: 0.1 10*3/uL (ref 0.0–0.1)
Basophils Relative: 0 %
Eosinophils Absolute: 0.2 10*3/uL (ref 0.0–0.5)
Eosinophils Relative: 2 %
HCT: 28.1 % — ABNORMAL LOW (ref 39.0–52.0)
Hemoglobin: 8.9 g/dL — ABNORMAL LOW (ref 13.0–17.0)
Immature Granulocytes: 2 %
Lymphocytes Relative: 5 %
Lymphs Abs: 0.6 10*3/uL — ABNORMAL LOW (ref 0.7–4.0)
MCH: 32.7 pg (ref 26.0–34.0)
MCHC: 31.7 g/dL (ref 30.0–36.0)
MCV: 103.3 fL — ABNORMAL HIGH (ref 80.0–100.0)
Monocytes Absolute: 0.9 10*3/uL (ref 0.1–1.0)
Monocytes Relative: 8 %
Neutro Abs: 10 10*3/uL — ABNORMAL HIGH (ref 1.7–7.7)
Neutrophils Relative %: 83 %
Platelets: 115 10*3/uL — ABNORMAL LOW (ref 150–400)
RBC: 2.72 MIL/uL — ABNORMAL LOW (ref 4.22–5.81)
RDW: 19.4 % — ABNORMAL HIGH (ref 11.5–15.5)
WBC: 12.1 10*3/uL — ABNORMAL HIGH (ref 4.0–10.5)
nRBC: 0.2 % (ref 0.0–0.2)

## 2021-03-28 LAB — RESP PANEL BY RT-PCR (FLU A&B, COVID) ARPGX2
Influenza A by PCR: NEGATIVE
Influenza B by PCR: NEGATIVE
SARS Coronavirus 2 by RT PCR: NEGATIVE

## 2021-03-28 IMAGING — CR DG CHEST 2V
2 series · 2 of 2 positions shown · non-contrast
Comparison: [DATE]

CLINICAL DATA: Shortness of breath

EXAM:
CHEST - 2 VIEW

[chest lat]
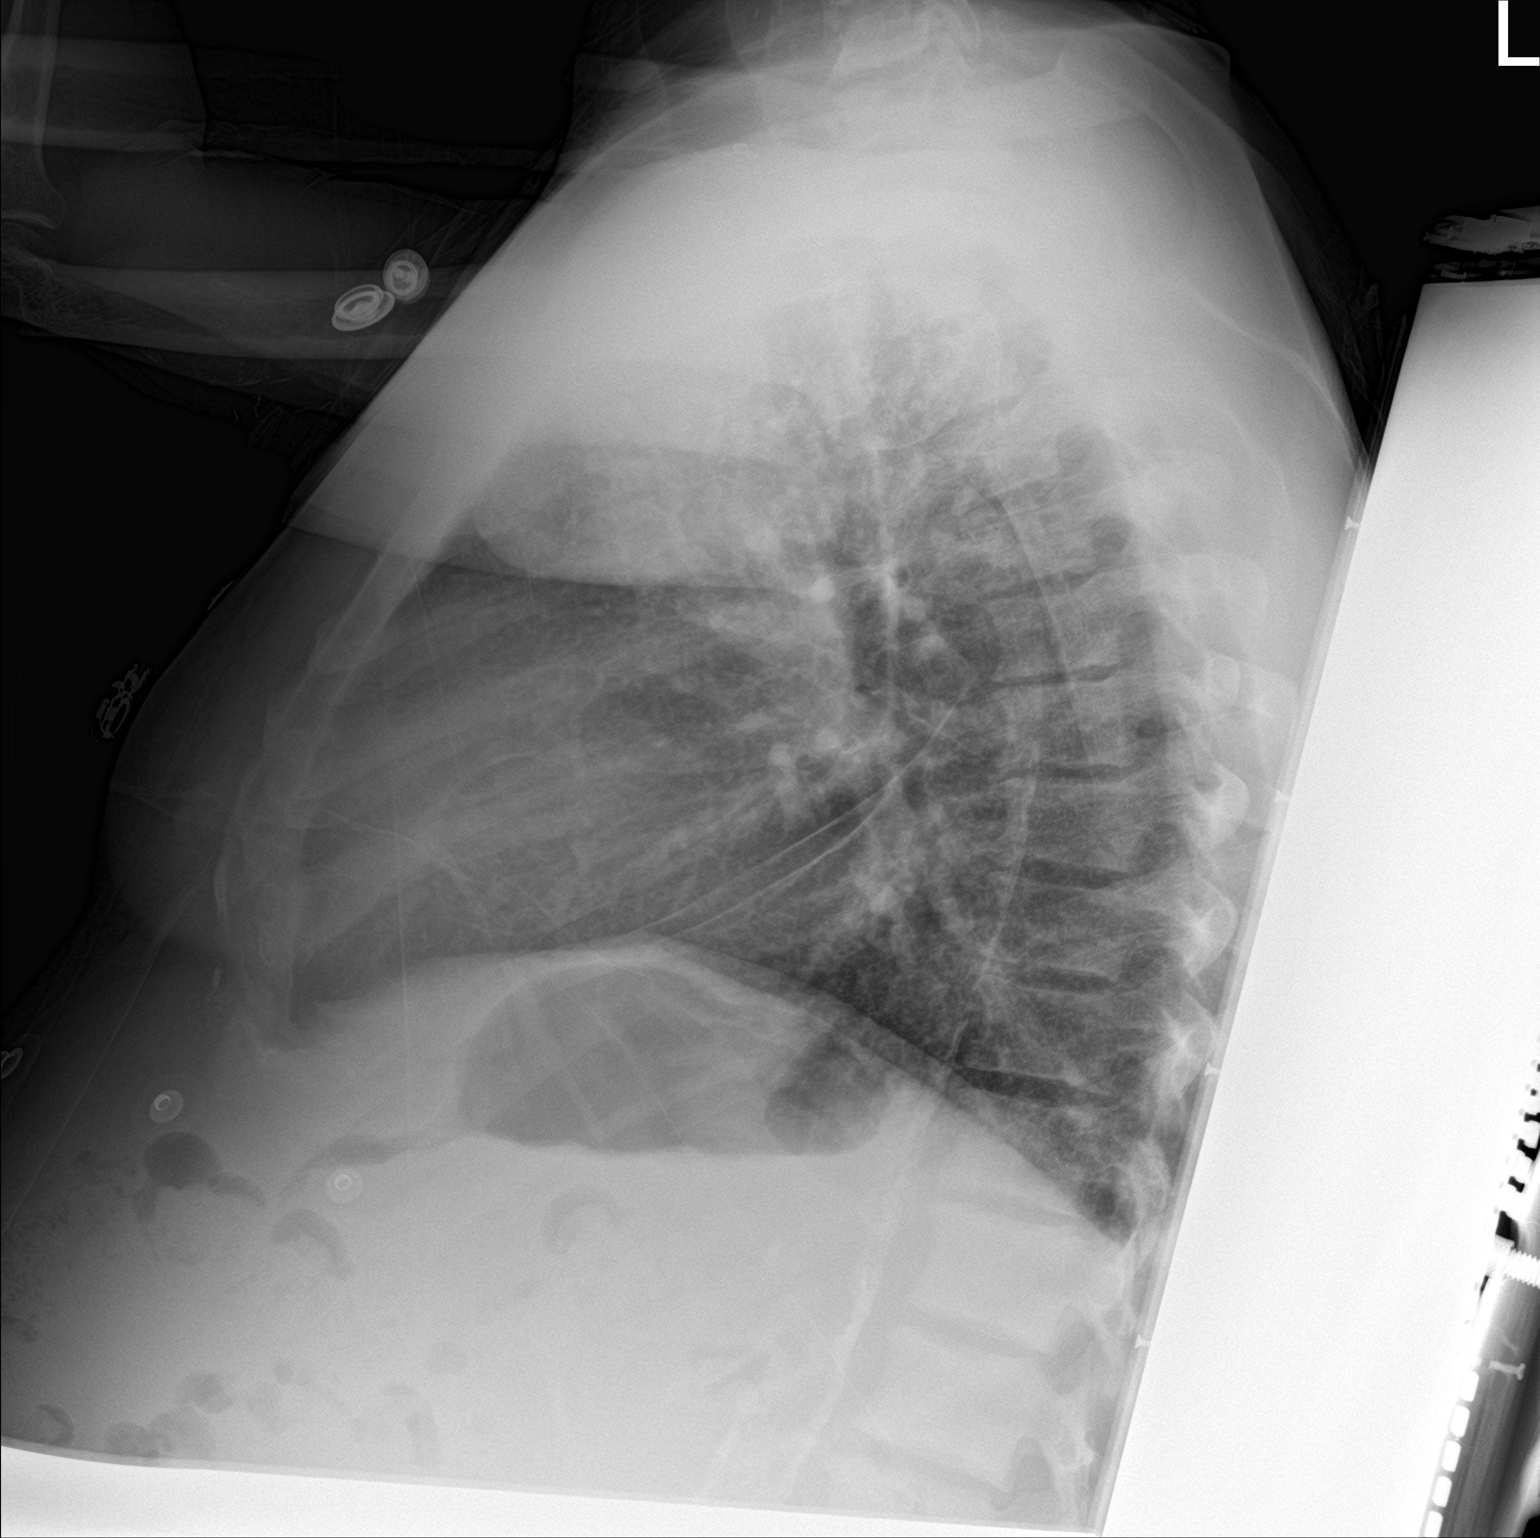

[chest ap]
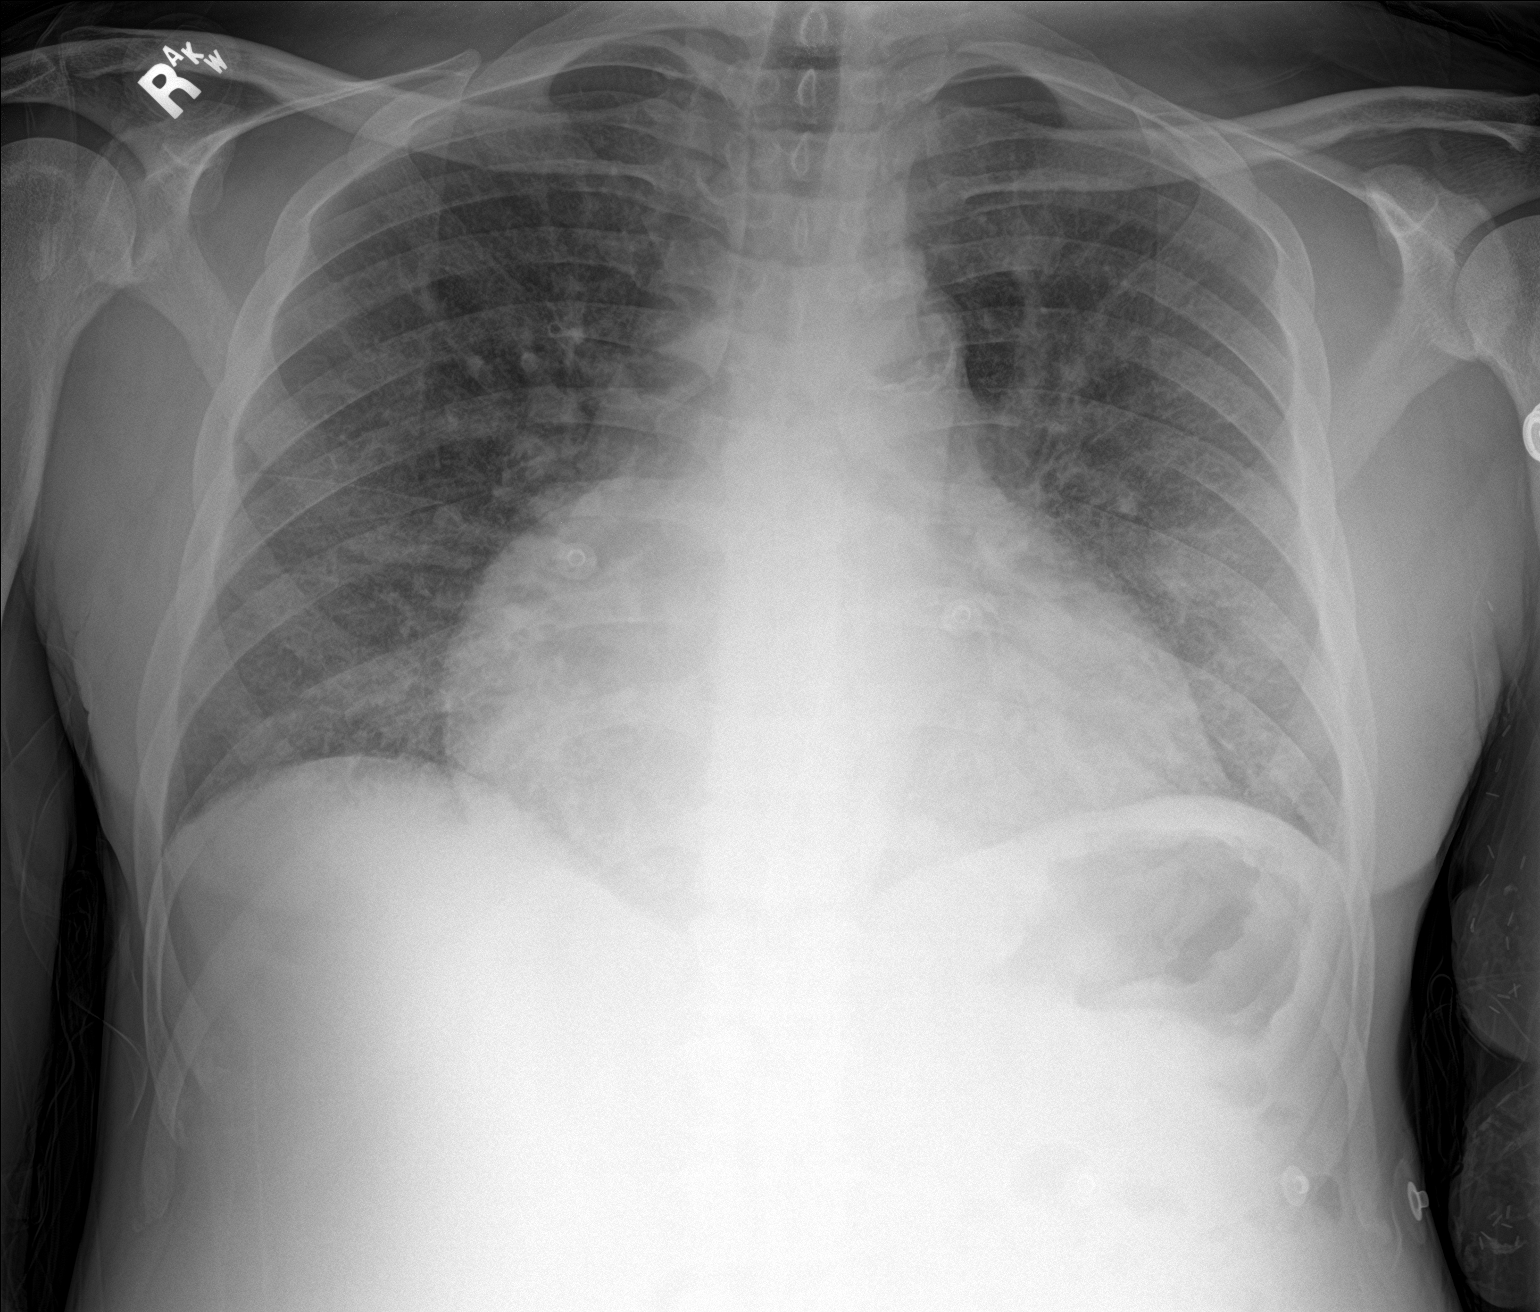

[2 of 2 positions shown; findings below may reference images not displayed]

FINDINGS: Cardiomegaly with pulmonary vascular congestion. Very mild
interstitial edema is possible, although this appearance is slightly
improved from the prior. Mild right basilar opacity, favoring
atelectasis over pneumonia. No pleural effusion or pneumothorax.

Thoracic aortic atherosclerosis.
IMPRESSION: Cardiomegaly with possible mild interstitial edema, although
improved.

Mild right basilar atelectasis.

## 2021-03-28 MED ORDER — LIDOCAINE HCL (PF) 1 % IJ SOLN: 5.0000 mL | INTRAMUSCULAR | Status: DC | PRN

## 2021-03-28 MED ORDER — LIDOCAINE-PRILOCAINE 2.5-2.5 % EX CREA
1.0000 "application " | TOPICAL_CREAM | CUTANEOUS | Status: DC | PRN
  Filled 2021-03-28: qty 5

## 2021-03-28 MED ORDER — LIDOCAINE-PRILOCAINE 2.5-2.5 % EX CREA: 1.0000 "application " | TOPICAL_CREAM | CUTANEOUS | Status: DC | PRN

## 2021-03-28 MED ORDER — SODIUM CHLORIDE 0.9 % IV SOLN: 100.0000 mL | INTRAVENOUS | Status: DC | PRN

## 2021-03-28 MED ORDER — HEPARIN SODIUM (PORCINE) 1000 UNIT/ML DIALYSIS: 20.0000 [IU]/kg | INTRAMUSCULAR | Status: DC | PRN

## 2021-03-28 MED ORDER — HEPARIN SODIUM (PORCINE) 1000 UNIT/ML DIALYSIS: 1000.0000 [IU] | INTRAMUSCULAR | Status: DC | PRN

## 2021-03-28 MED ORDER — ALTEPLASE 2 MG IJ SOLR: 2.0000 mg | Freq: Once | INTRAMUSCULAR | Status: DC | PRN

## 2021-03-28 MED ORDER — CHLORHEXIDINE GLUCONATE CLOTH 2 % EX PADS: 6.0000 | MEDICATED_PAD | Freq: Every day | CUTANEOUS | Status: DC

## 2021-03-28 MED ORDER — CAMPHOR-MENTHOL 0.5-0.5 % EX LOTN
TOPICAL_LOTION | CUTANEOUS | Status: DC | PRN
  Filled 2021-03-28: qty 222

## 2021-03-28 MED ORDER — HEPARIN SODIUM (PORCINE) 1000 UNIT/ML DIALYSIS
1000.0000 [IU] | INTRAMUSCULAR | Status: DC | PRN
  Filled 2021-03-28: qty 1

## 2021-03-28 MED ORDER — PENTAFLUOROPROP-TETRAFLUOROETH EX AERO: 1.0000 "application " | INHALATION_SPRAY | CUTANEOUS | Status: DC | PRN

## 2021-03-28 MED ORDER — PENTAFLUOROPROP-TETRAFLUOROETH EX AERO
1.0000 "application " | INHALATION_SPRAY | CUTANEOUS | Status: DC | PRN
  Filled 2021-03-28: qty 116

## 2021-03-28 NOTE — ED Provider Notes (Signed)
[facility] EMERGENCY DEPARTMENT Provider Note   CSN: [id number] Arrival date & time: 03/28/21  0008     History Chief Complaint  Patient presents with   Shortness of Breath    Jordan [name] is a 55 y.o. male with history of ESRD on HD (M/W/F), alcohol use disorder, aortic atherosclerosis, paroxysmal atrial fibrillation, chronic diastolic CHF, and COPD who presents to the emergency department accompanied by police from jail with a chief complaint of shortness of breath.  The patient ports that he has been more short of breath for the last 5 days.  He has also intermittently been having a productive cough with brown sputum.  He feels as if his face, legs, and abdomen are more swollen.  He suspects that this is because he missed dialysis today because he was incarcerated.  He was last dialyzed on Monday, but only completed 3 hours of his session.  Reports no other missed recent dialysis sessions.  Reports that he has been feeling more fatigued with nasal congestion and rhinorrhea over the last week.  He denies fever, chills, chest pain, vomiting, diarrhea, syncope, dizziness, lightheadedness.   EMS noted heart rate in the 130s with blood pressure 220/120 in route.  No tachycardia upon arrival to the emergency department.  The history is provided by the patient, medical records and the police. No language interpreter was used.      Past Medical History:  Diagnosis Date   Anemia    Aortic atherosclerosis ([place]) 11/12/2019   CKD (chronic kidney disease)    Stage 5  Dialysis - M/W/F in [place], [place]   Dyspnea    tx with inhaler when sick   ED (erectile dysfunction)    Emphysema of lung ([place]) 11/12/2019   ETOH abuse    History of blood transfusion    Hypertension    Wears dentures     Patient Active Problem List   Diagnosis Date Noted   CHF (congestive heart failure) ([place]) 03/25/2021   PAF (paroxysmal atrial fibrillation) ([place]) 03/20/2021   Chronic diastolic CHF  (congestive heart failure) ([place]) 03/20/2021   Alcohol abuse 03/20/2021   Vision loss of left eye 03/20/2021   SOB (shortness of breath) 03/20/2021   Shortness of breath 03/10/2021   Abscess of buttock 03/02/2021   Fluid overload 03/02/2021   Multifocal pneumonia 01/13/2021   Current mild episode of major depressive disorder without prior episode ([place]) 04/09/2020   Atrial fibrillation with RVR ([place]) 04/04/2020   Non-compliance with renal dialysis ([place]) 03/21/2020   Acute CHF (congestive heart failure) ([place]) 03/12/2020   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 ([place]) 02/03/2020   Acute on chronic combined systolic and diastolic CHF (congestive heart failure) ([place]) 02/03/2020   Acute respiratory failure with hypoxia ([place]) 11/12/2019   Aortic atherosclerosis ([place]) 11/12/2019   Emphysema of lung ([place]) 11/12/2019   Acute on chronic congestive heart failure ([place]) 10/22/2019   Volume overload 10/05/2019   Anemia in ESRD (end-stage renal disease) ([place]) 05/17/2018   ESRD on dialysis ([place]) 05/26/2017   Essential hypertension 05/26/2017   Moderate protein-calorie malnutrition ([place]) 08/13/2016   Secondary hyperparathyroidism of renal origin ([place]) 08/13/2016   Tobacco abuse 08/04/2016    Past Surgical History:  Procedure Laterality Date   BASCILIC VEIN TRANSPOSITION Left 08/07/2016   Procedure: LEFT BASILIC VEIN TRANSPOSITION;  Surgeon: Angelia Mould, MD;  Location: [facility];  Service: Vascular;  Laterality: Left;   CLOSED REDUCTION NASAL FRACTURE N/A 08/11/2019   Procedure:  CLOSED REDUCTION NASAL FRACTURE WITH STABILIZATION;  Surgeon: Irene Limbo, MD;  Location: [facility];  Service: Plastics;  Laterality: N/A;   COLONOSCOPY N/A 08/12/2016   Procedure: COLONOSCOPY;  Surgeon: Otis Brace, MD;  Location: [facility];  Service: Gastroenterology;  Laterality: N/A;   ESOPHAGOGASTRODUODENOSCOPY N/A 08/12/2016   Procedure: ESOPHAGOGASTRODUODENOSCOPY (EGD);  Surgeon: Otis Brace, MD;  Location: [facility];  Service: Gastroenterology;  Laterality: N/A;   INSERTION OF DIALYSIS CATHETER N/A 08/07/2016   Procedure: INSERTION OF TUNNELED DIALYSIS CATHETER;  Surgeon: Angelia Mould, MD;  Location: [facility];  Service: Vascular;  Laterality: N/A;   LIGATION OF ARTERIOVENOUS  FISTULA Left 09/12/2016   Procedure: BANDING OF LEFT  ARTERIOVENOUS  FISTULA;  Surgeon: Angelia Mould, MD;  Location: [facility];  Service: Vascular;  Laterality: Left;   LOWER EXTREMITY INTERVENTION Right 12/02/2018   Procedure: LOWER EXTREMITY INTERVENTION;  Surgeon: Algernon Huxley, MD;  Location: [facility];  Service: Cardiovascular;  Laterality: Right;       Family History  Problem Relation Age of Onset   Diabetes Mother    Kidney failure Mother    Healthy Father    Kidney failure Brother    Healthy Sister    Kidney disease Daughter    Post-traumatic stress disorder Neg Hx    Bladder Cancer Neg Hx    Kidney cancer Neg Hx     Social History   Tobacco Use   Smoking status: Every Day    Packs/day: 0.50    Years: 40.00    Pack years: 20.00    Types: Cigarettes   Smokeless tobacco: Never  Vaping Use   Vaping Use: Never used  Substance Use Topics   Alcohol use: Yes    Alcohol/week: 21.0 standard drinks    Types: 21 Cans of beer per week    Comment: last drink 6 months ago   Drug use: No    Home Medications Prior to Admission medications   Medication Sig Start Date End Date Taking? Authorizing Provider  AURYXIA 1 GM 210 MG(Fe) tablet Take 420 mg by mouth every Monday, Wednesday, and Friday with hemodialysis. 11/13/20   [provider]  calcium acetate (PHOSLO) 667 MG capsule Take 2 capsules (1,334 mg total) by mouth 3 (three) times daily with meals. 03/11/21   Lorella Nimrod, MD  cinacalcet (SENSIPAR) 60 MG tablet SMARTSIG:2 Tablet(s) By Mouth Every Evening 03/18/21   [provider]  epoetin alfa (EPOGEN) 4000 UNIT/ML injection Inject 1 mL  (4,000 Units total) into the vein every Monday, Wednesday, and Friday with hemodialysis. 06/22/20   Enzo Bi, MD  fluticasone (FLONASE) 50 MCG/ACT nasal spray Place 1 spray into both nostrils daily for 15 days. 03/27/21 04/11/21  Arrien, Jimmy Picket, MD  hydrOXYzine (VISTARIL) 50 MG capsule Take 1 capsule (50 mg total) by mouth 3 (three) times daily as needed. 02/26/21   Cuthriell, Charline Bills, PA-C  Lidocaine 3 % CREA Apply 1 application topically 2 (two) times daily as needed (pain). 02/26/21   Cuthriell, Charline Bills, PA-C  thiamine 100 MG tablet Take 1 tablet (100 mg total) by mouth daily. 03/11/21   Lorella Nimrod, MD  VELPHORO 500 MG chewable tablet Chew 500 mg by mouth 3 (three) times daily. 03/15/21   [provider]  witch hazel-glycerin (TUCKS) pad Apply topically as needed for itching, hemorrhoids or irritation. 03/26/21   Arrien, Jimmy Picket, MD    Allergies    Lisinopril  Review of Systems   Review of  Systems  Constitutional:  Positive for fatigue. Negative for appetite change, chills, diaphoresis and fever.  HENT:  Positive for congestion and rhinorrhea. Negative for ear discharge, ear pain, facial swelling, hearing loss, nosebleeds, sinus pressure, sore throat and voice change.   Eyes:  Negative for visual disturbance.  Respiratory:  Positive for cough and shortness of breath. Negative for wheezing.   Cardiovascular:  Negative for chest pain and palpitations.  Gastrointestinal:  Negative for abdominal pain, diarrhea, nausea and vomiting.  Genitourinary:  Negative for dysuria, flank pain, frequency, hematuria, penile pain, penile swelling and testicular pain.  Musculoskeletal:  Negative for back pain, myalgias, neck pain and neck stiffness.  Skin:  Negative for color change, rash and wound.  Allergic/Immunologic: Negative for immunocompromised state.  Neurological:  Negative for dizziness, seizures, syncope, speech difficulty, weakness, light-headedness and headaches.   Psychiatric/Behavioral:  Negative for confusion.    Physical Exam Updated Vital Signs BP (!) 162/108 (BP Location: Right Arm)   Pulse 97   Temp 98.4 F (36.9 C) (Oral)   Resp (!) 21   SpO2 99%   Physical Exam Vitals and nursing note reviewed.  Constitutional:      General: He is not in acute distress.    Appearance: He is well-developed. He is not ill-appearing, toxic-appearing or diaphoretic.  HENT:     Head: Normocephalic.  Eyes:     Conjunctiva/sclera: Conjunctivae normal.  Cardiovascular:     Rate and Rhythm: Normal rate and regular rhythm.     Heart sounds: No murmur heard. Pulmonary:     Effort: Pulmonary effort is normal.  Abdominal:     General: There is distension.     Palpations: Abdomen is soft. There is no mass.     Tenderness: There is no abdominal tenderness. There is no right CVA tenderness or left CVA tenderness.     Hernia: No hernia is present.     Comments: Abdomen is distended, but soft and nontender.  Musculoskeletal:     Cervical back: Neck supple.  Skin:    General: Skin is warm and dry.  Neurological:     Mental Status: He is alert.  Psychiatric:        Behavior: Behavior normal.    ED Results / Procedures / Treatments   Labs (all labs ordered are listed, but only abnormal results are displayed) Labs Reviewed  CBC WITH DIFFERENTIAL/PLATELET - Abnormal; Notable for the following components:      Result Value   WBC 12.1 (*)    RBC 2.72 (*)    Hemoglobin 8.9 (*)    HCT 28.1 (*)    MCV 103.3 (*)    RDW 19.4 (*)    Platelets 115 (*)    Neutro Abs 10.0 (*)    Lymphs Abs 0.6 (*)    Abs Immature Granulocytes 0.29 (*)    All other components within normal limits  BASIC METABOLIC PANEL - Abnormal; Notable for the following components:   Glucose, Bld 114 (*)    BUN 93 (*)    Creatinine, Ser 13.85 (*)    Calcium 7.7 (*)    GFR, Estimated 4 (*)    Anion gap 20 (*)    All other components within normal limits  RESP PANEL BY RT-PCR (FLU  A&B, COVID) [id number]    EKG None  Radiology DG Chest 2 View  Result Date: 03/28/2021 CLINICAL DATA:  Shortness of breath EXAM: CHEST - 2 VIEW COMPARISON:  03/25/2021 FINDINGS: Cardiomegaly with pulmonary vascular congestion. Very  mild interstitial edema is possible, although this appearance is slightly improved from the prior. Mild right basilar opacity, favoring atelectasis over pneumonia. No pleural effusion or pneumothorax. Thoracic aortic atherosclerosis. IMPRESSION: Cardiomegaly with possible mild interstitial edema, although improved. Mild right basilar atelectasis. Electronically Signed   By: Julian Hy M.D.   On: 03/28/2021 01:02    Procedures Procedures   Medications Ordered in ED Medications - No data to display  ED Course  I have reviewed the triage vital signs and the nursing notes.  Pertinent labs & imaging results that were available during my care of the patient were reviewed by me and considered in my medical decision making (see chart for details).    MDM Rules/Calculators/A&P                          55 year old male with history of ESRD on HD (M/W/F), alcohol use disorder, aortic atherosclerosis, paroxysmal atrial fibrillation, chronic diastolic CHF, and COPD who presents the emergency department accompanied by police from jail needing dialysis.  He is on a Monday, Wednesday, Friday schedule.  He was last dialyzed Monday at the hospital.  He is reporting worsening abdominal distention and shortness of breath.  He also is endorsing a cough with productive ground sputum, but no cough observed on exam.  No constitutional symptoms.  Vital signs are stable.  Labs and imaging been reviewed and independently interpreted by me.  Creatinine is 13.85.  Potassium is normal at 4.2.  He does have a leukocytosis of 12.1 there is some mild right basilar opacity concerning for atelectasis.  Less suspicious for pneumonia at this time.  Suspect sputum is related to volume overload.   I think it is reasonable that the patient can be dialyzed and then return to jail.  We will plan to call nephrology.  We will plan to discuss plan with ensuring that the patient can get timely dialysis if he is going to have prolonged incarceration.  Consult to nephrology.  Dr. Hollie Salk will plan to add the patient to the dialysis schedule today.  Patient seems appropriate for discharge after he has been dialyzed.  Final Clinical Impression(s) / ED Diagnoses Final diagnoses:  Transfusion associated circulatory overload    Rx / DC Orders ED Discharge Orders     None        Joanne Gavel, PA-C 03/28/21 0618    Fatima Blank, MD 03/28/21 [phone]

## 2021-03-28 NOTE — Discharge Instructions (Addendum)
ER for worsening symptoms Go to your next dialysis session as scheduled

## 2021-03-28 NOTE — Progress Notes (Signed)
Nephrology Quick Note:  S:  [name] was seen on HD - 4L UFG and tolerating so far, on nasal O2.  O: Blood pressure (!) 142/85, pulse 94, temperature 98 F (36.7 C), temperature source Oral, resp. rate 18, height '5\' 4"'$  (1.626 m), weight 75.7 kg, SpO2 99 %.  GEN: Chronically ill appearing man, diffuse facial edema CV: RRR PULM: CTA anteriorly EXTREM: 2+ LE edema ACCESS: AVF  Labs: Na 141, K 4.2, BUN 93, Cr 14, Ca 7.7, WBC 12.1, Hgb 8.9. COVID/Flu negative.  Imaging: CXR: mild/improved pulm edema  A/P: - ESRD: Spoke to Jordan [name] medial team -- transportation team there is not allowed to transport him out of [facility] for dialysis (his regular HD unit is in /[place]). They have already arranged for him to be transiently dialyzed at an alternative HD unit in [facility] on TTS sched at 7:15am. In fact, he was supposed to go there this morning but brought to ED via EMS in early AM instead. Will dialyze him here today, but plan to is to discharge afterwards and will need to be transported to his new/transient until on TTS schedule while currently incarcerated, starting there on Sat 03/30/21. - HTN/volume: He is about 8kg above EDW today -> going for 4-5L as tolerated.  Jordan Penton, PA-C Jordan [name] Pager [phone]

## 2021-03-28 NOTE — Progress Notes (Signed)
Pt received from Emergency Department to Hemodialysis unit, in stretcher, with [facility] dept officer at bedside  Pt somnolent, sonorous, on room air, with stable vitals. Cooperative, agrees to HD and goals of tx.  Consent signed, accessed w/o issues, tx begun @ 1410.  Orders include:  Goal: 3-4L Duration 4 hr DFR 600  Will continue to monitor

## 2021-03-28 NOTE — ED Notes (Signed)
Pt ambulatory to restroom and back.

## 2021-03-28 NOTE — Progress Notes (Addendum)
Pt demanding to be taken off machine, because "I feel it in my ribs, I don't want to be too tired when I get back to the jail. If I'm sick when I get there, I'm gonna get boxed."  Provider notified. He signed Refusal paperwork. He was taken off treatment.  Pt not listening to encouragement or education.  Pt also upset about not getting "medicine for itching."  Very upset.  Report given to charge RN in ED, Gretta Cool, stable for transport

## 2021-03-28 NOTE — ED Notes (Signed)
Pt to xray

## 2021-03-28 NOTE — Progress Notes (Signed)
Nephrology team aware that Jordan [name] is back in the ED with dyspnea.  Orders placed for HD today - will be early afternoon most likely.  Need to sort out why the jail did not take him to his usual dialysis center yesterday and remedy this.  Veneta Penton, PA-C [employer] Pager [phone]

## 2021-03-28 NOTE — ED Notes (Signed)
Dialysis will come get him somewhere between 1-2pm

## 2021-03-28 NOTE — ED Triage Notes (Signed)
Pt presents to ED BIB [facility] from jail. Pt c/o SOB x5d. Productive cough of brown stuff Missed dialysis today.  4L [place] - 98%, chronically on 2L HR - 130's 200/120 Facial edema CBG - 147

## 2021-04-03 NOTE — ED Provider Notes (Signed)
[facility] Provider Note   CSN: [id number] Arrival date & time: 03/25/21  0917     History No chief complaint on file.   Jordan [name] is a 55 y.o. male.  HPI    55 year old male past medical history for end-stage renal disease on hemodialysis, chronic heart failure, chronic anemia, hypertension and anemia of chronic kidney disease who presented chief complaint of shortness of breath and fluid buildup.  Patient is incarcerated.  He is supposed to get his hemodialysis on Monday, Wednesday and Friday.  Unfortunately, the prison does not have the ability to for patient to be transported to hemodialysis, and patient was complaining of chest pain and shortness of breath therefore he was brought to the ER. Past Medical History:  Diagnosis Date   Anemia    Aortic atherosclerosis ([place]) 11/12/2019   CKD (chronic kidney disease)    Stage 5  Dialysis - M/W/F in [place], [place]   Dyspnea    tx with inhaler when sick   ED (erectile dysfunction)    Emphysema of lung ([place]) 11/12/2019   ETOH abuse    History of blood transfusion    Hypertension    Wears dentures     Patient Active Problem List   Diagnosis Date Noted   CHF (congestive heart failure) ([place]) 03/25/2021   PAF (paroxysmal atrial fibrillation) ([place]) 03/20/2021   Chronic diastolic CHF (congestive heart failure) ([place]) 03/20/2021   Alcohol abuse 03/20/2021   Vision loss of left eye 03/20/2021   SOB (shortness of breath) 03/20/2021   Shortness of breath 03/10/2021   Abscess of buttock 03/02/2021   Fluid overload 03/02/2021   Multifocal pneumonia 01/13/2021   Current mild episode of major depressive disorder without prior episode ([place]) 04/09/2020   Atrial fibrillation with RVR ([place]) 04/04/2020   Non-compliance with renal dialysis ([place]) 03/21/2020   Acute CHF (congestive heart failure) ([place]) 03/12/2020   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 ([place]) 02/03/2020   Acute on chronic combined  systolic and diastolic CHF (congestive heart failure) ([place]) 02/03/2020   Acute respiratory failure with hypoxia ([place]) 11/12/2019   Aortic atherosclerosis ([place]) 11/12/2019   Emphysema of lung ([place]) 11/12/2019   Acute on chronic congestive heart failure ([place]) 10/22/2019   Volume overload 10/05/2019   Anemia in ESRD (end-stage renal disease) ([place]) 05/17/2018   ESRD on dialysis ([place]) 05/26/2017   Essential hypertension 05/26/2017   Moderate protein-calorie malnutrition ([place]) 08/13/2016   Secondary hyperparathyroidism of renal origin ([place]) 08/13/2016   Tobacco abuse 08/04/2016    Past Surgical History:  Procedure Laterality Date   BASCILIC VEIN TRANSPOSITION Left 08/07/2016   Procedure: LEFT BASILIC VEIN TRANSPOSITION;  Surgeon: Angelia Mould, MD;  Location: [facility];  Service: Vascular;  Laterality: Left;   CLOSED REDUCTION NASAL FRACTURE N/A 08/11/2019   Procedure: CLOSED REDUCTION NASAL FRACTURE WITH STABILIZATION;  Surgeon: Irene Limbo, MD;  Location: [facility];  Service: Plastics;  Laterality: N/A;   COLONOSCOPY N/A 08/12/2016   Procedure: COLONOSCOPY;  Surgeon: Otis Brace, MD;  Location: [facility];  Service: Gastroenterology;  Laterality: N/A;   ESOPHAGOGASTRODUODENOSCOPY N/A 08/12/2016   Procedure: ESOPHAGOGASTRODUODENOSCOPY (EGD);  Surgeon: Otis Brace, MD;  Location: [facility];  Service: Gastroenterology;  Laterality: N/A;   INSERTION OF DIALYSIS CATHETER N/A 08/07/2016   Procedure: INSERTION OF TUNNELED DIALYSIS CATHETER;  Surgeon: Angelia Mould, MD;  Location: [facility];  Service: Vascular;  Laterality: N/A;   LIGATION OF ARTERIOVENOUS  FISTULA Left 09/12/2016  Procedure: BANDING OF LEFT  ARTERIOVENOUS  FISTULA;  Surgeon: Angelia Mould, MD;  Location: [facility];  Service: Vascular;  Laterality: Left;   LOWER EXTREMITY INTERVENTION Right 12/02/2018   Procedure: LOWER EXTREMITY INTERVENTION;  Surgeon: Algernon Huxley, MD;  Location: [facility];   Service: Cardiovascular;  Laterality: Right;       Family History  Problem Relation Age of Onset   Diabetes Mother    Kidney failure Mother    Healthy Father    Kidney failure Brother    Healthy Sister    Kidney disease Daughter    Post-traumatic stress disorder Neg Hx    Bladder Cancer Neg Hx    Kidney cancer Neg Hx     Social History   Tobacco Use   Smoking status: Every Day    Packs/day: 0.50    Years: 40.00    Pack years: 20.00    Types: Cigarettes   Smokeless tobacco: Never  Vaping Use   Vaping Use: Never used  Substance Use Topics   Alcohol use: Yes    Alcohol/week: 21.0 standard drinks    Types: 21 Cans of beer per week    Comment: last drink 6 months ago   Drug use: No    Home Medications Prior to Admission medications   Medication Sig Start Date End Date Taking? Authorizing Provider  AURYXIA 1 GM 210 MG(Fe) tablet Take 420 mg by mouth every Monday, Wednesday, and Friday with hemodialysis. 11/13/20   [provider]  calcium acetate (PHOSLO) 667 MG capsule Take 2 capsules (1,334 mg total) by mouth 3 (three) times daily with meals. 03/11/21   Lorella Nimrod, MD  cinacalcet (SENSIPAR) 60 MG tablet SMARTSIG:2 Tablet(s) By Mouth Every Evening 03/18/21   [provider]  epoetin alfa (EPOGEN) 4000 UNIT/ML injection Inject 1 mL (4,000 Units total) into the vein every Monday, Wednesday, and Friday with hemodialysis. 06/22/20   Enzo Bi, MD  fluticasone (FLONASE) 50 MCG/ACT nasal spray Place 1 spray into both nostrils daily for 15 days. 03/27/21 04/11/21  Arrien, Jimmy Picket, MD  hydrOXYzine (VISTARIL) 50 MG capsule Take 1 capsule (50 mg total) by mouth 3 (three) times daily as needed. 02/26/21   Cuthriell, Charline Bills, PA-C  Lidocaine 3 % CREA Apply 1 application topically 2 (two) times daily as needed (pain). 02/26/21   Cuthriell, Charline Bills, PA-C  thiamine 100 MG tablet Take 1 tablet (100 mg total) by mouth daily. 03/11/21   Lorella Nimrod, MD  VELPHORO 500  MG chewable tablet Chew 500 mg by mouth 3 (three) times daily. 03/15/21   [provider]  witch hazel-glycerin (TUCKS) pad Apply topically as needed for itching, hemorrhoids or irritation. 03/26/21   Arrien, Jimmy Picket, MD    Allergies    Lisinopril  Review of Systems   Review of Systems  Constitutional:  Positive for activity change.  Respiratory:  Positive for shortness of breath.   Cardiovascular:  Positive for chest pain.  Neurological:  Negative for headaches.  All other systems reviewed and are negative.  Physical Exam Updated Vital Signs BP (!) 149/98 (BP Location: Right Arm)   Pulse 91   Temp 98.1 F (36.7 C) (Oral)   Resp 16   Ht '5\' 4"'$  (1.626 m)   Wt 75.7 kg   SpO2 94%   BMI 28.65 kg/m   Physical Exam Vitals and nursing note reviewed.  Constitutional:      Appearance: He is well-developed.  HENT:  Head: Atraumatic.  Cardiovascular:     Rate and Rhythm: Normal rate.  Pulmonary:     Effort: Pulmonary effort is normal. No respiratory distress.     Breath sounds: Rales present.  Musculoskeletal:     Cervical back: Neck supple.  Skin:    General: Skin is warm.  Neurological:     Mental Status: He is alert and oriented to person, place, and time.    ED Results / Procedures / Treatments   Labs (all labs ordered are listed, but only abnormal results are displayed) Labs Reviewed  BASIC METABOLIC PANEL - Abnormal; Notable for the following components:      Result Value   Potassium 5.9 (*)    CO2 18 (*)    Glucose, Bld 105 (*)    BUN 124 (*)    Creatinine, Ser 18.24 (*)    GFR, Estimated 3 (*)    Anion gap 21 (*)    All other components within normal limits  CBC - Abnormal; Notable for the following components:   RBC 2.23 (*)    Hemoglobin 7.4 (*)    HCT 23.8 (*)    MCV 106.7 (*)    RDW 18.0 (*)    Platelets 148 (*)    nRBC 0.5 (*)    All other components within normal limits  BASIC METABOLIC PANEL - Abnormal; Notable for the  following components:   Potassium 6.3 (*)    CO2 19 (*)    BUN 131 (*)    Creatinine, Ser 18.57 (*)    GFR, Estimated 3 (*)    Anion gap 22 (*)    All other components within normal limits  BASIC METABOLIC PANEL - Abnormal; Notable for the following components:   Glucose, Bld 109 (*)    BUN 72 (*)    Creatinine, Ser 11.73 (*)    Calcium 8.6 (*)    GFR, Estimated 5 (*)    Anion gap 17 (*)    All other components within normal limits  PHOSPHORUS - Abnormal; Notable for the following components:   Phosphorus 10.9 (*)    All other components within normal limits  TROPONIN I (HIGH SENSITIVITY) - Abnormal; Notable for the following components:   Troponin I (High Sensitivity) 124 (*)    All other components within normal limits  TROPONIN I (HIGH SENSITIVITY) - Abnormal; Notable for the following components:   Troponin I (High Sensitivity) 121 (*)    All other components within normal limits  RESP PANEL BY RT-PCR (FLU A&B, COVID) ARPGX2  TSH  TYPE AND SCREEN  PREPARE RBC (CROSSMATCH)    EKG EKG Interpretation  Date/Time:  Monday March 25 2021 09:27:18 EDT Ventricular Rate:  98 PR Interval:  146 QRS Duration: 90 QT Interval:  362 QTC Calculation: 462 R Axis:   106 Text Interpretation: Normal sinus rhythm Possible Right ventricular hypertrophy Abnormal ECG No acute changes No significant change since last tracing Confirmed by Varney Biles [phone]) on 03/25/2021 12:44:25 PM  Radiology No results found.  Procedures Procedures   Medications Ordered in ED Medications  sodium zirconium cyclosilicate (LOKELMA) packet 10 g (10 g Oral Given 03/25/21 1311)  dextrose 50 % solution 50 mL (50 mLs Intravenous Given 03/25/21 2149)  insulin aspart (novoLOG) injection 10 Units (10 Units Intravenous Given 03/25/21 2150)    ED Course  I have reviewed the triage vital signs and the nursing notes.  Pertinent labs & imaging results that were available during my care of the patient  were reviewed  by me and considered in my medical decision making (see chart for details).    MDM Rules/Calculators/A&P                           55 year old male with history of ESRD on HD comes in with chief complaint of shortness of breath.  He is due for dialysis today, but he was unfortunately incarcerated over the weekend and the prison does not have the ability to transport him for his routine HD.  I have discussed the case with nephrology service who has requested that patient be admitted to the hospital and they will dialyze him.  Final Clinical Impression(s) / ED Diagnoses Final diagnoses:  Acute pulmonary edema ([place])    Rx / DC Orders ED Discharge Orders          Ordered    fluticasone (FLONASE) 50 MCG/ACT nasal spray  Daily        03/26/21 1547    Increase activity slowly        03/26/21 1534    Diet - low sodium heart healthy        03/26/21 1534    Discharge instructions       Comments: Follow with primary care in 7 to 10 days,   03/26/21 1534    witch hazel-glycerin (TUCKS) pad  As needed        03/26/21 [place], Ladaisha Portillo, MD 04/03/21 2317

## 2021-04-08 ENCOUNTER — Observation Stay
Admission: EM | Admit: 2021-04-08 | Discharge: 2021-04-09 | Disposition: A | Discharge status: Home / Self Care | Payer: Medicare Other | Attending: Internal Medicine | Admitting: Internal Medicine

## 2021-04-08 ENCOUNTER — Other Ambulatory Visit: Payer: Self-pay

## 2021-04-08 ENCOUNTER — Emergency Department: Payer: Medicare Other

## 2021-04-08 DIAGNOSIS — Z79899 Other long term (current) drug therapy: Secondary | ICD-10-CM | POA: Insufficient documentation

## 2021-04-08 DIAGNOSIS — R079 Chest pain, unspecified: Secondary | ICD-10-CM

## 2021-04-08 DIAGNOSIS — J810 Acute pulmonary edema: Secondary | ICD-10-CM

## 2021-04-08 DIAGNOSIS — I5032 Chronic diastolic (congestive) heart failure: Secondary | ICD-10-CM | POA: Diagnosis not present

## 2021-04-08 DIAGNOSIS — R112 Nausea with vomiting, unspecified: Secondary | ICD-10-CM

## 2021-04-08 DIAGNOSIS — R778 Other specified abnormalities of plasma proteins: Secondary | ICD-10-CM | POA: Diagnosis not present

## 2021-04-08 DIAGNOSIS — D696 Thrombocytopenia, unspecified: Secondary | ICD-10-CM

## 2021-04-08 DIAGNOSIS — Z992 Dependence on renal dialysis: Secondary | ICD-10-CM | POA: Diagnosis not present

## 2021-04-08 DIAGNOSIS — R600 Localized edema: Secondary | ICD-10-CM

## 2021-04-08 DIAGNOSIS — R0602 Shortness of breath: Secondary | ICD-10-CM

## 2021-04-08 DIAGNOSIS — Z20822 Contact with and (suspected) exposure to covid-19: Secondary | ICD-10-CM | POA: Insufficient documentation

## 2021-04-08 DIAGNOSIS — I5043 Acute on chronic combined systolic (congestive) and diastolic (congestive) heart failure: Secondary | ICD-10-CM | POA: Diagnosis not present

## 2021-04-08 DIAGNOSIS — J9601 Acute respiratory failure with hypoxia: Principal | ICD-10-CM

## 2021-04-08 DIAGNOSIS — J449 Chronic obstructive pulmonary disease, unspecified: Secondary | ICD-10-CM | POA: Diagnosis present

## 2021-04-08 DIAGNOSIS — I132 Hypertensive heart and chronic kidney disease with heart failure and with stage 5 chronic kidney disease, or end stage renal disease: Secondary | ICD-10-CM | POA: Diagnosis not present

## 2021-04-08 DIAGNOSIS — E8779 Other fluid overload: Secondary | ICD-10-CM

## 2021-04-08 DIAGNOSIS — F17210 Nicotine dependence, cigarettes, uncomplicated: Secondary | ICD-10-CM | POA: Diagnosis not present

## 2021-04-08 DIAGNOSIS — I480 Paroxysmal atrial fibrillation: Secondary | ICD-10-CM | POA: Diagnosis not present

## 2021-04-08 DIAGNOSIS — D631 Anemia in chronic kidney disease: Secondary | ICD-10-CM

## 2021-04-08 DIAGNOSIS — N186 End stage renal disease: Secondary | ICD-10-CM | POA: Diagnosis not present

## 2021-04-08 DIAGNOSIS — J9621 Acute and chronic respiratory failure with hypoxia: Secondary | ICD-10-CM | POA: Diagnosis present

## 2021-04-08 DIAGNOSIS — J439 Emphysema, unspecified: Secondary | ICD-10-CM

## 2021-04-08 DIAGNOSIS — I10 Essential (primary) hypertension: Secondary | ICD-10-CM

## 2021-04-08 DIAGNOSIS — R7989 Other specified abnormal findings of blood chemistry: Secondary | ICD-10-CM | POA: Diagnosis present

## 2021-04-08 DIAGNOSIS — Z720 Tobacco use: Secondary | ICD-10-CM

## 2021-04-08 DIAGNOSIS — E8770 Fluid overload, unspecified: Secondary | ICD-10-CM

## 2021-04-08 LAB — CBC WITH DIFFERENTIAL/PLATELET
Abs Immature Granulocytes: 0.07 10*3/uL (ref 0.00–0.07)
Basophils Absolute: 0 10*3/uL (ref 0.0–0.1)
Basophils Relative: 0 %
Eosinophils Absolute: 0.2 10*3/uL (ref 0.0–0.5)
Eosinophils Relative: 2 %
HCT: 21.5 % — ABNORMAL LOW (ref 39.0–52.0)
Hemoglobin: 6.7 g/dL — ABNORMAL LOW (ref 13.0–17.0)
Immature Granulocytes: 1 %
Lymphocytes Relative: 7 %
Lymphs Abs: 0.6 10*3/uL — ABNORMAL LOW (ref 0.7–4.0)
MCH: 32.8 pg (ref 26.0–34.0)
MCHC: 31.2 g/dL (ref 30.0–36.0)
MCV: 105.4 fL — ABNORMAL HIGH (ref 80.0–100.0)
Monocytes Absolute: 0.8 10*3/uL (ref 0.1–1.0)
Monocytes Relative: 11 %
Neutro Abs: 6.2 10*3/uL (ref 1.7–7.7)
Neutrophils Relative %: 79 %
Platelets: 109 10*3/uL — ABNORMAL LOW (ref 150–400)
RBC: 2.04 MIL/uL — ABNORMAL LOW (ref 4.22–5.81)
RDW: 17.1 % — ABNORMAL HIGH (ref 11.5–15.5)
Smear Review: DECREASED
WBC: 7.8 10*3/uL (ref 4.0–10.5)
nRBC: 0 % (ref 0.0–0.2)

## 2021-04-08 LAB — COMPREHENSIVE METABOLIC PANEL
ALT: 16 U/L (ref 0–44)
AST: 22 U/L (ref 15–41)
Albumin: 3.2 g/dL — ABNORMAL LOW (ref 3.5–5.0)
Alkaline Phosphatase: 102 U/L (ref 38–126)
Anion gap: 21 — ABNORMAL HIGH (ref 5–15)
BUN: 111 mg/dL — ABNORMAL HIGH (ref 6–20)
CO2: 22 mmol/L (ref 22–32)
Calcium: 8.6 mg/dL — ABNORMAL LOW (ref 8.9–10.3)
Chloride: 99 mmol/L (ref 98–111)
Creatinine, Ser: 13.26 mg/dL — ABNORMAL HIGH (ref 0.61–1.24)
GFR, Estimated: 4 mL/min — ABNORMAL LOW (ref >60)
Glucose, Bld: 122 mg/dL — ABNORMAL HIGH (ref 70–99)
Potassium: 4.4 mmol/L (ref 3.5–5.1)
Sodium: 142 mmol/L (ref 135–145)
Total Bilirubin: 1.5 mg/dL — ABNORMAL HIGH (ref 0.3–1.2)
Total Protein: 7.1 g/dL (ref 6.5–8.1)

## 2021-04-08 LAB — TROPONIN I (HIGH SENSITIVITY): Troponin I (High Sensitivity): 250 ng/L (ref <18)

## 2021-04-08 LAB — BRAIN NATRIURETIC PEPTIDE: B Natriuretic Peptide: 2828.4 pg/mL — ABNORMAL HIGH (ref 0.0–100.0)

## 2021-04-08 LAB — RESP PANEL BY RT-PCR (FLU A&B, COVID) ARPGX2
Influenza A by PCR: NEGATIVE
Influenza B by PCR: NEGATIVE
SARS Coronavirus 2 by RT PCR: NEGATIVE

## 2021-04-08 LAB — PREPARE RBC (CROSSMATCH)

## 2021-04-08 IMAGING — DX DG CHEST 1V PORT
1 series · 1 of 1 positions shown · non-contrast
Comparison: [DATE]

CLINICAL DATA: Shortness of breath

EXAM:
PORTABLE CHEST 1 VIEW

[chest ap]
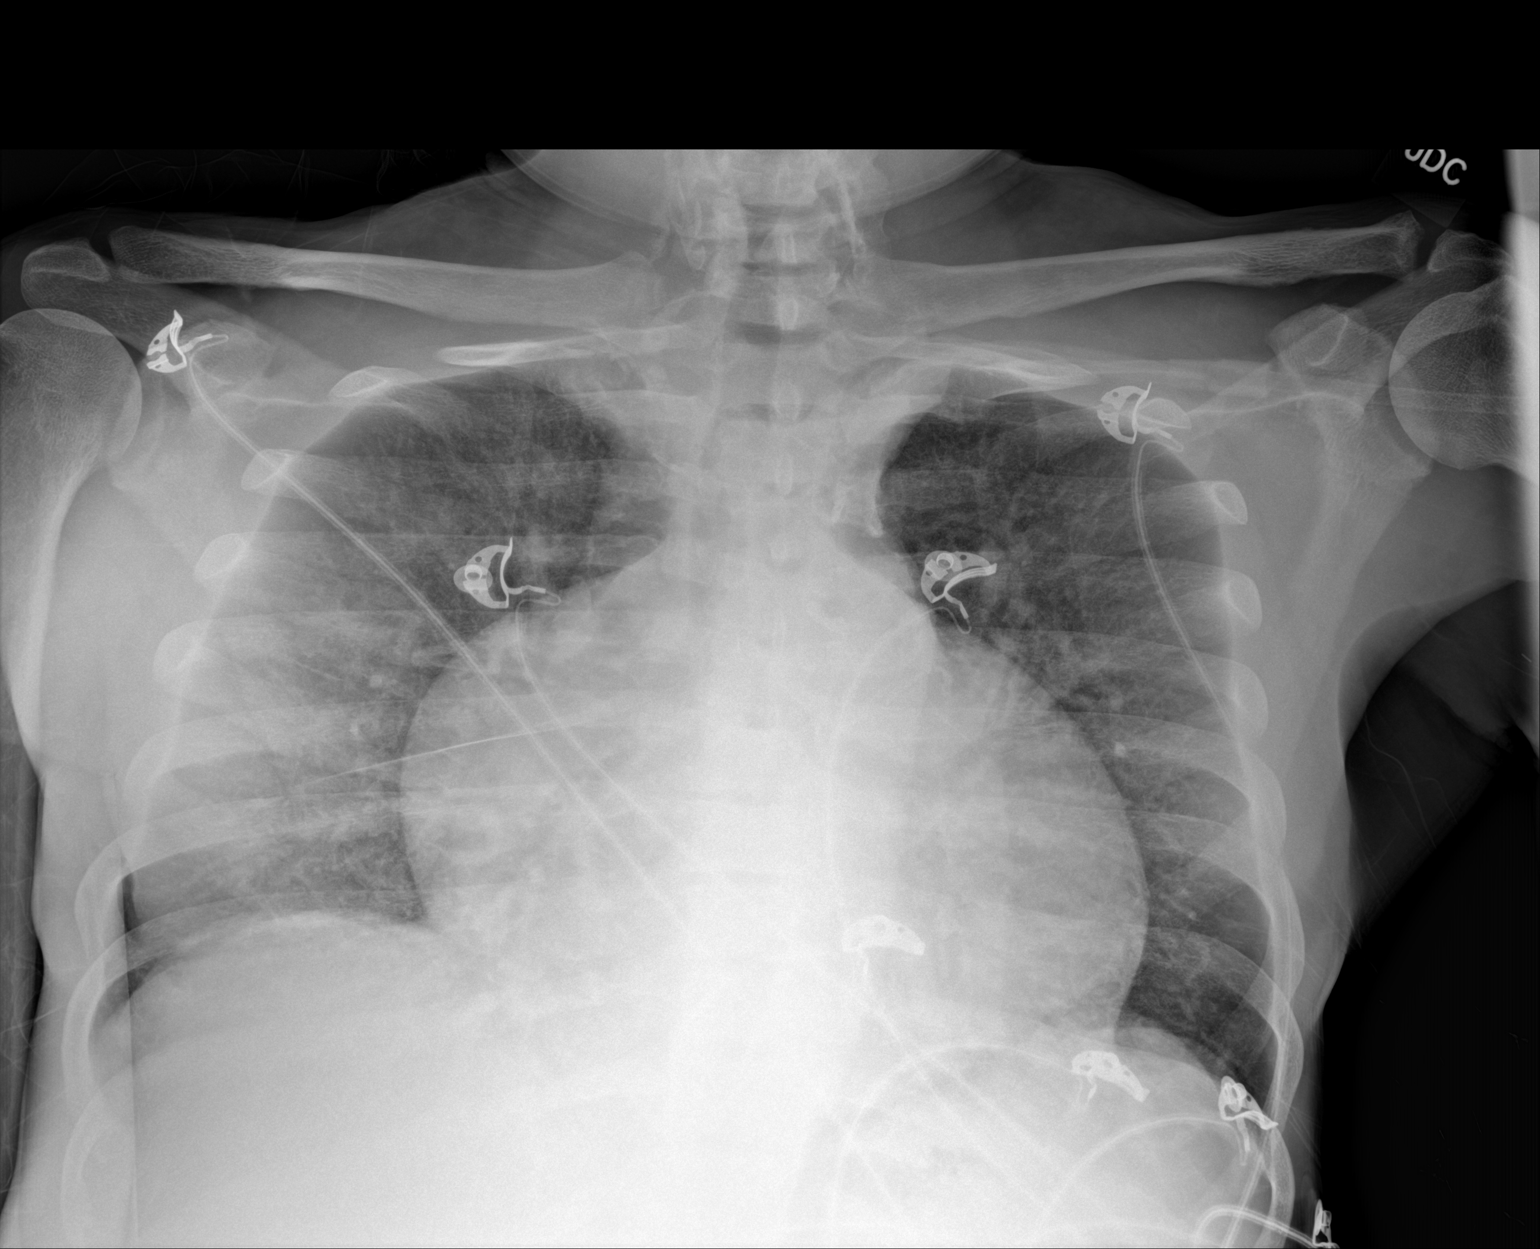

[1 of 1 positions shown; findings below may reference images not displayed]

FINDINGS: Bilateral diffuse mild interstitial thickening. No focal
consolidation. No pleural effusion or pneumothorax. Stable
cardiomegaly.

No acute osseous abnormality.
IMPRESSION: Bilateral diffuse interstitial thickening and cardiomegaly
concerning for mild pulmonary edema.

## 2021-04-08 MED ORDER — NICOTINE 21 MG/24HR TD PT24: 21.0000 mg | MEDICATED_PATCH | Freq: Every day | TRANSDERMAL | Status: DC

## 2021-04-08 MED ORDER — ALBUTEROL SULFATE (2.5 MG/3ML) 0.083% IN NEBU: 3.0000 mL | INHALATION_SOLUTION | RESPIRATORY_TRACT | Status: DC | PRN

## 2021-04-08 MED ORDER — HEPARIN SODIUM (PORCINE) 1000 UNIT/ML DIALYSIS
1000.0000 [IU] | INTRAMUSCULAR | Status: DC | PRN
  Filled 2021-04-08: qty 1

## 2021-04-08 MED ORDER — DM-GUAIFENESIN ER 30-600 MG PO TB12: 1.0000 | ORAL_TABLET | Freq: Two times a day (BID) | ORAL | Status: DC | PRN

## 2021-04-08 MED ORDER — HYDRALAZINE HCL 20 MG/ML IJ SOLN: 5.0000 mg | INTRAMUSCULAR | Status: DC | PRN

## 2021-04-08 MED ORDER — CHLORHEXIDINE GLUCONATE CLOTH 2 % EX PADS
6.0000 | MEDICATED_PAD | Freq: Every day | CUTANEOUS | Status: DC
  Filled 2021-04-08: qty 6

## 2021-04-08 MED ORDER — ACETAMINOPHEN 325 MG PO TABS: 650.0000 mg | ORAL_TABLET | Freq: Four times a day (QID) | ORAL | Status: DC | PRN

## 2021-04-08 MED ORDER — DIPHENHYDRAMINE HCL 50 MG/ML IJ SOLN
INTRAMUSCULAR | Status: AC
  Filled 2021-04-08: qty 1

## 2021-04-08 MED ORDER — DIPHENHYDRAMINE HCL 25 MG PO CAPS
25.0000 mg | ORAL_CAPSULE | Freq: Four times a day (QID) | ORAL | Status: DC | PRN
  Administered 2021-04-09: 25 mg via ORAL
  Filled 2021-04-08: qty 1

## 2021-04-08 MED ORDER — SODIUM CHLORIDE 0.9 % IV SOLN: 100.0000 mL | INTRAVENOUS | Status: DC | PRN

## 2021-04-08 MED ORDER — PENTAFLUOROPROP-TETRAFLUOROETH EX AERO
1.0000 "application " | INHALATION_SPRAY | CUTANEOUS | Status: DC | PRN
  Filled 2021-04-08: qty 30

## 2021-04-08 MED ORDER — DIPHENHYDRAMINE HCL 50 MG/ML IJ SOLN
12.5000 mg | Freq: Once | INTRAMUSCULAR | Status: AC
  Administered 2021-04-08: 12.5 mg via INTRAVENOUS

## 2021-04-08 MED ORDER — EPOETIN ALFA 10000 UNIT/ML IJ SOLN
10000.0000 [IU] | INTRAMUSCULAR | Status: DC
  Administered 2021-04-08: 10000 [IU] via INTRAVENOUS
  Filled 2021-04-08: qty 1

## 2021-04-08 MED ORDER — SODIUM CHLORIDE 0.9 % IV SOLN: 10.0000 mL/h | Freq: Once | INTRAVENOUS | Status: DC

## 2021-04-08 MED ORDER — ONDANSETRON HCL 4 MG/2ML IJ SOLN: 4.0000 mg | Freq: Three times a day (TID) | INTRAMUSCULAR | Status: DC | PRN

## 2021-04-08 MED ORDER — LIDOCAINE-PRILOCAINE 2.5-2.5 % EX CREA
1.0000 "application " | TOPICAL_CREAM | CUTANEOUS | Status: DC | PRN
  Filled 2021-04-08: qty 5

## 2021-04-08 MED ORDER — NITROGLYCERIN 0.4 MG SL SUBL: 0.4000 mg | SUBLINGUAL_TABLET | SUBLINGUAL | Status: DC | PRN

## 2021-04-08 MED ORDER — SENNOSIDES-DOCUSATE SODIUM 8.6-50 MG PO TABS: 1.0000 | ORAL_TABLET | Freq: Every evening | ORAL | Status: DC | PRN

## 2021-04-08 MED ORDER — LIDOCAINE HCL (PF) 1 % IJ SOLN
5.0000 mL | INTRAMUSCULAR | Status: DC | PRN
  Filled 2021-04-08: qty 5

## 2021-04-08 MED ORDER — ALTEPLASE 2 MG IJ SOLR
2.0000 mg | Freq: Once | INTRAMUSCULAR | Status: DC | PRN
  Filled 2021-04-08: qty 2

## 2021-04-08 MED ORDER — IPRATROPIUM BROMIDE 0.02 % IN SOLN
2.5000 mL | Freq: Four times a day (QID) | RESPIRATORY_TRACT | Status: DC
  Administered 2021-04-08: 0.5 mg via RESPIRATORY_TRACT
  Filled 2021-04-08 (×2): qty 2.5

## 2021-04-08 NOTE — H&P (Signed)
History and Physical    CACE ORLOV [id number] DOB: 1966-07-26 DOA: 04/08/2021  Referring MD/NP/PA:   PCP: Pcp, No   Patient coming from:  The patient is coming from home.  At baseline, pt is independent for most of ADL.        Chief Complaint: SOB  HPI: Jordan HEYSER is a 55 y.o. male with medical history significant of hypertension, alcohol abuse in remission, tobacco abuse, COPD on prn 2L Oxygen at home, anemia, ESRD-HD (MWF), noncompliance to dialysis, who presents with shortness of breath.  Patient has history of noncompliance to dialysis. Patient's last dialysis was on last Wednesday.  He developed shortness of breath, which has been progressively worsening.  Patient is still he has cough with brownish colored mucus production.  No chest pain, fever or chills.  Patient is constipated, denies nausea vomiting or abdominal pain. No rectal bleeding.   ED Course: pt was found to have WBC 7.8, troponin level 250, BNP 2828, hemoglobin 6.7 (8.9 on 03/28/2021), pending COVID PCR, potassium 4.4, bicarbonate 22, creatinine 13.26, BUN 111, temperature normal, oxygen saturation 80% on room air, which improved to 93% on 2 L oxygen.  Chest x-ray showed cardiomegaly and mild pulmonary edema.  Patient is placed on progressive bed for observation, Dr. Candiss Norse of nephrology is consulted for dialysis.  Review of Systems:   General: no fevers, chills, no body weight gain, has fatigue HEENT: no blurry vision, hearing changes or sore throat Respiratory: has dyspnea, coughing, no wheezing CV: no chest pain, no palpitations GI: no nausea, vomiting, abdominal pain, diarrhea, has constipation GU: no dysuria, burning on urination, increased urinary frequency, hematuria  Ext: has leg edema Neuro: no unilateral weakness, numbness, or tingling, no vision change or hearing loss Skin: has rash, no skin tear. MSK: No muscle spasm, no deformity, no limitation of range of movement in spin Heme: No easy  bruising.  Travel history: No recent long distant travel.  Allergy:  Allergies  Allergen Reactions   Lisinopril Swelling    Past Medical History:  Diagnosis Date   Anemia    Aortic atherosclerosis ([place]) 11/12/2019   CKD (chronic kidney disease)    Stage 5  Dialysis - M/W/F in [place], [place]   Dyspnea    tx with inhaler when sick   ED (erectile dysfunction)    Emphysema of lung ([place]) 11/12/2019   ETOH abuse    History of blood transfusion    Hypertension    Wears dentures     Past Surgical History:  Procedure Laterality Date   BASCILIC VEIN TRANSPOSITION Left 08/07/2016   Procedure: LEFT BASILIC VEIN TRANSPOSITION;  Surgeon: Angelia Mould, MD;  Location: [facility];  Service: Vascular;  Laterality: Left;   CLOSED REDUCTION NASAL FRACTURE N/A 08/11/2019   Procedure: CLOSED REDUCTION NASAL FRACTURE WITH STABILIZATION;  Surgeon: Irene Limbo, MD;  Location: [facility];  Service: Plastics;  Laterality: N/A;   COLONOSCOPY N/A 08/12/2016   Procedure: COLONOSCOPY;  Surgeon: Otis Brace, MD;  Location: [facility];  Service: Gastroenterology;  Laterality: N/A;   ESOPHAGOGASTRODUODENOSCOPY N/A 08/12/2016   Procedure: ESOPHAGOGASTRODUODENOSCOPY (EGD);  Surgeon: Otis Brace, MD;  Location: [facility];  Service: Gastroenterology;  Laterality: N/A;   INSERTION OF DIALYSIS CATHETER N/A 08/07/2016   Procedure: INSERTION OF TUNNELED DIALYSIS CATHETER;  Surgeon: Angelia Mould, MD;  Location: [facility];  Service: Vascular;  Laterality: N/A;   LIGATION OF ARTERIOVENOUS  FISTULA Left 09/12/2016   Procedure: BANDING OF LEFT  ARTERIOVENOUS  FISTULA;  Surgeon: Angelia Mould, MD;  Location: [facility];  Service: Vascular;  Laterality: Left;   LOWER EXTREMITY INTERVENTION Right 12/02/2018   Procedure: LOWER EXTREMITY INTERVENTION;  Surgeon: Algernon Huxley, MD;  Location: [facility];  Service: Cardiovascular;  Laterality: Right;    Social History:  reports that he has been  smoking cigarettes. He has a 20.00 pack-year smoking history. He has never used smokeless tobacco. He reports current alcohol use of about 21.0 standard drinks of alcohol per week. He reports that he does not use drugs.  Family History:  Family History  Problem Relation Age of Onset   Diabetes Mother    Kidney failure Mother    Healthy Father    Kidney failure Brother    Healthy Sister    Kidney disease Daughter    Post-traumatic stress disorder Neg Hx    Bladder Cancer Neg Hx    Kidney cancer Neg Hx      Prior to Admission medications   Medication Sig Start Date End Date Taking? Authorizing Provider  AURYXIA 1 GM 210 MG(Fe) tablet Take 420 mg by mouth every Monday, Wednesday, and Friday with hemodialysis. 11/13/20   [provider]  calcium acetate (PHOSLO) 667 MG capsule Take 2 capsules (1,334 mg total) by mouth 3 (three) times daily with meals. 03/11/21   Lorella Nimrod, MD  cinacalcet (SENSIPAR) 60 MG tablet SMARTSIG:2 Tablet(s) By Mouth Every Evening 03/18/21   [provider]  epoetin alfa (EPOGEN) 4000 UNIT/ML injection Inject 1 mL (4,000 Units total) into the vein every Monday, Wednesday, and Friday with hemodialysis. 06/22/20   Enzo Bi, MD  fluticasone (FLONASE) 50 MCG/ACT nasal spray Place 1 spray into both nostrils daily for 15 days. 03/27/21 04/11/21  Arrien, Jimmy Picket, MD  hydrOXYzine (VISTARIL) 50 MG capsule Take 1 capsule (50 mg total) by mouth 3 (three) times daily as needed. 02/26/21   Cuthriell, Charline Bills, PA-C  Lidocaine 3 % CREA Apply 1 application topically 2 (two) times daily as needed (pain). 02/26/21   Cuthriell, Charline Bills, PA-C  thiamine 100 MG tablet Take 1 tablet (100 mg total) by mouth daily. 03/11/21   Lorella Nimrod, MD  VELPHORO 500 MG chewable tablet Chew 500 mg by mouth 3 (three) times daily. 03/15/21   [provider]  witch hazel-glycerin (TUCKS) pad Apply topically as needed for itching, hemorrhoids or irritation. 03/26/21   Tawni Millers, MD    Physical Exam: Vitals:   04/08/21 1143 04/08/21 1146 04/08/21 1310 04/08/21 1330  BP:   113/77 106/61  Pulse:   92 (!) 107  Resp:   18 18  Temp: 98.8 F (37.1 C)     TempSrc: Oral     SpO2:   97% (!) 28%  Weight:  77.1 kg    Height:  '5\' 4"'$  (1.626 m)     General: Not in acute distress. Pale looking HEENT:       Eyes: PERRL, EOMI, no scleral icterus.       ENT: No discharge from the ears and nose, no pharynx injection, no tonsillar enlargement.        Neck: No JVD, no bruit, no mass felt. Heme: No neck lymph node enlargement. Cardiac: S1/S2, RRR, No murmurs, No gallops or rubs. Respiratory: has fine crackles bilaterally GI: Soft, nondistended, nontender, no rebound pain, no organomegaly, BS present. GU: No hematuria Ext: 1+ pitting leg edema bilaterally. 1+DP/PT pulse bilaterally. Musculoskeletal: No joint deformities, No joint redness or warmth, no  limitation of ROM in spin. Skin: has scattered dark color rash in legs with itches, which seems to be chronic issues Neuro: Alert, oriented X3, cranial nerves II-XII grossly intact, moves all extremities normally.  Psych: Patient is not psychotic, no suicidal or hemocidal ideation.  Labs on Admission: I have personally reviewed following labs and imaging studies  CBC: Recent Labs  Lab 04/08/21 1149  WBC 7.8  NEUTROABS 6.2  HGB 6.7*  HCT 21.5*  MCV 105.4*  PLT 0000000*   Basic Metabolic Panel: Recent Labs  Lab 04/08/21 1149  NA 142  K 4.4  CL 99  CO2 22  GLUCOSE 122*  BUN 111*  CREATININE 13.26*  CALCIUM 8.6*   GFR: Estimated Creatinine Clearance: 6.1 mL/min (A) (by C-G formula based on SCr of 13.26 mg/dL (H)). Liver Function Tests: Recent Labs  Lab 04/08/21 1149  AST 22  ALT 16  ALKPHOS 102  BILITOT 1.5*  PROT 7.1  ALBUMIN 3.2*   No results for input(s): LIPASE, AMYLASE in the last 168 hours. No results for input(s): AMMONIA in the last 168 hours. Coagulation Profile: No results  for input(s): INR, PROTIME in the last 168 hours. Cardiac Enzymes: No results for input(s): CKTOTAL, CKMB, CKMBINDEX, TROPONINI in the last 168 hours. BNP (last 3 results) No results for input(s): PROBNP in the last 8760 hours. HbA1C: No results for input(s): HGBA1C in the last 72 hours. CBG: No results for input(s): GLUCAP in the last 168 hours. Lipid Profile: No results for input(s): CHOL, HDL, LDLCALC, TRIG, CHOLHDL, LDLDIRECT in the last 72 hours. Thyroid Function Tests: No results for input(s): TSH, T4TOTAL, FREET4, T3FREE, THYROIDAB in the last 72 hours. Anemia Panel: No results for input(s): VITAMINB12, FOLATE, FERRITIN, TIBC, IRON, RETICCTPCT in the last 72 hours. Urine analysis:    Component Value Date/Time   COLORURINE YELLOW (A) 01/13/2021 0153   APPEARANCEUR CLEAR (A) 01/13/2021 0153   LABSPEC 1.012 01/13/2021 0153   PHURINE 9.0 (H) 01/13/2021 0153   GLUCOSEU 150 (A) 01/13/2021 0153   HGBUR NEGATIVE 01/13/2021 0153   BILIRUBINUR NEGATIVE 01/13/2021 0153   KETONESUR NEGATIVE 01/13/2021 0153   PROTEINUR >=300 (A) 01/13/2021 0153   NITRITE NEGATIVE 01/13/2021 0153   LEUKOCYTESUR NEGATIVE 01/13/2021 0153   Sepsis Labs: '@LABRCNTIP'$ (procalcitonin:4,lacticidven:4) )No results found for this or any previous visit (from the past 240 hour(s)).   Radiological Exams on Admission: DG Chest Port 1 View  Result Date: 04/08/2021 CLINICAL DATA:  Shortness of breath EXAM: PORTABLE CHEST 1 VIEW COMPARISON:  03/28/2021 FINDINGS: Bilateral diffuse mild interstitial thickening. No focal consolidation. No pleural effusion or pneumothorax. Stable cardiomegaly. No acute osseous abnormality. IMPRESSION: Bilateral diffuse interstitial thickening and cardiomegaly concerning for mild pulmonary edema. Electronically Signed   By: Kathreen Devoid   On: 04/08/2021 13:54     EKG: I have personally reviewed.  Sinus rhythm, QTC 489, right axis deviation, low voltage, nonspecific T wave  change  Assessment/Plan Principal Problem:   Acute respiratory failure with hypoxia (HCC) Active Problems:   Tobacco abuse   ESRD on dialysis ([place])   Essential hypertension   Elevated troponin   Anemia in ESRD (end-stage renal disease) (HCC)   Fluid overload   PAF (paroxysmal atrial fibrillation) (HCC)   Chronic diastolic CHF (congestive heart failure) (HCC)   Thrombocytopenia (HCC)   COPD (chronic obstructive pulmonary disease) (HCC)  Acute respiratory failure with hypoxia: likely multifactorial etiology, including fluid overload 2/2 noncompliance with dialysis, COPD, chronic dCHF.  He has worsening anemia which may have  contributed to his shortness of breath supportively.  Dr. Candiss Norse of renal is consulted for dialysis   -will place in progressive bed for observation -Dialysis per renal -Bronchodilators -Nasal cannula oxygen to maintain oxygen saturation above 93%   Tobacco abuse -Nicotine patch   ESRD on dialysis ([facility]): -HD per renal    Essential hypertension: pt is not taking meds now. Bp 106/61 -IV hydralazine prn -Continue oral hydralazine   Anemia in ESRD (end-stage renal disease) ([place]): Hemoglobin 8.9 on 7/7/222 --> 6.7. no active bleeding - 1U of blood is ordered for transfusion by EDP -Follow-up with CBC - continue Auryxia   Emphysema of lung/COPD -Bronchodilators   PAF (paroxysmal atrial fibrillation) [facility]): Patient is not taking anticoagulants.  Heart rate 107 -Cardiac monitor   Chronic diastolic CHF (congestive heart failure) ([place]): 2D echo on 03/02/2021 showed EF of 50% with grade 2 diastolic dysfunction.  -Volume management per renal by dialysis  Elevated troponin: Troponin 250.  Patient has chronically elevated troponin 121-220 recently.  No chest pain.  Likely due to decreased clearance secondary to ESRD. -Trend troponin -Check FLP (patient had A1c 5.0 on 04/07/2020)  Thrombocytopenia [facility]): this is a chronic issue.  Platelet of 109.  No active  bleeding. -Follow-up of a CBC     DVT ppx: SCD Code Status: Full code Family Communication: not done, no family member is at bed side.    Disposition Plan:  Anticipate discharge back to previous environment Consults called:  Dr. Candiss Norse of nephrology Admission status and Level of care: Progressive Cardiac:     for obs  Status is: Observation  The patient remains OBS appropriate and will d/c before 2 midnights.  Dispo: The patient is from: Home              Anticipated d/c is to: Home              Patient currently is not medically stable to d/c.   Difficult to place patient No            Date of Service 04/08/2021    [facility]   If 7PM-7AM, please contact night-coverage [email or web address] 04/08/2021, 3:21 PM

## 2021-04-08 NOTE — Progress Notes (Signed)
HD start 

## 2021-04-08 NOTE — Progress Notes (Signed)
Pt ended HD treatment 15 minutes early AMA. 1.7L fluid removed and did not meet UF goal of 2L. Pt irritated by lack of food options in HD. Has been restless and non compliant with HD treatment today. Report to primary RN and ccmd notified of patient upcoming departure from unit. Venous site held for extra time due to prolonged bleeding cause by patient movement and pushing clamp off.

## 2021-04-08 NOTE — Progress Notes (Signed)
HD end 

## 2021-04-08 NOTE — Progress Notes (Signed)
HD start at 1718. Pt has remained restless during hd, no c/o, moving access arm despite repeated education and laying on left side applying pressure on access arm. Bfr has been decreased to 350 from 400, due to increasing venous pressure from pt movement. Will continue to remind pt to keep access arm immobile and to not apply pressure to it during hd treatment. Otherwise, pt is stable.

## 2021-04-08 NOTE — Progress Notes (Signed)
Pre HD RN assessment 

## 2021-04-08 NOTE — Progress Notes (Signed)
Reapplied pt 02 via [place] and requested for patient to keep [place] on for remainder of HD treatment.

## 2021-04-08 NOTE — ED Provider Notes (Signed)
[facility] Emergency Department Provider Note   ____________________________________________   Event Date/Time   First MD Initiated Contact with Patient 04/08/21 1153     (approximate)  I have reviewed the triage vital signs and the nursing notes.   HISTORY  Chief Complaint Emesis    HPI Jordan [name] is a 55 y.o. male with a past medical history of end-stage renal disease on dialysis since M/W/F who presents for multiple complaints including shortness of breath, intermittent chest pain, emesis.  Patient states that the symptoms have been worsening since last Wednesday which was the last time that he went to dialysis.  Patient states that he was on his way to dialysis but unfortunately ran out of gas on the way there today.  Patient also complaining of associated bilateral lower extremity edema.  Patient currently denies any vision changes, tinnitus, difficulty speaking, facial droop, sore throat, abdominal pain, diarrhea, dysuria, or weakness/numbness/paresthesias in any extremity          Past Medical History:  Diagnosis Date   Anemia    Aortic atherosclerosis ([place]) 11/12/2019   CKD (chronic kidney disease)    Stage 5  Dialysis - M/W/F in [place], [place]   Dyspnea    tx with inhaler when sick   ED (erectile dysfunction)    Emphysema of lung ([place]) 11/12/2019   ETOH abuse    History of blood transfusion    Hypertension    Wears dentures     Patient Active Problem List   Diagnosis Date Noted   Thrombocytopenia ([place]) 04/08/2021   CHF (congestive heart failure) ([place]) 03/25/2021   PAF (paroxysmal atrial fibrillation) ([place]) 03/20/2021   Chronic diastolic CHF (congestive heart failure) ([place]) 03/20/2021   Alcohol abuse 03/20/2021   Vision loss of left eye 03/20/2021   SOB (shortness of breath) 03/20/2021   Shortness of breath 03/10/2021   Abscess of buttock 03/02/2021   Fluid overload 03/02/2021   Multifocal pneumonia 01/13/2021   Current  mild episode of major depressive disorder without prior episode ([place]) 04/09/2020   Atrial fibrillation with RVR ([place]) 04/04/2020   Non-compliance with renal dialysis ([place]) 03/21/2020   Acute CHF (congestive heart failure) ([place]) 03/12/2020   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 ([place]) 02/03/2020   Acute on chronic combined systolic and diastolic CHF (congestive heart failure) ([place]) 02/03/2020   Acute respiratory failure with hypoxia ([place]) 11/12/2019   Aortic atherosclerosis ([place]) 11/12/2019   Emphysema of lung ([place]) 11/12/2019   Acute on chronic congestive heart failure ([place]) 10/22/2019   Volume overload 10/05/2019   Elevated troponin 05/17/2018   Anemia in ESRD (end-stage renal disease) ([place]) 05/17/2018   ESRD on dialysis ([place]) 05/26/2017   Essential hypertension 05/26/2017   Moderate protein-calorie malnutrition ([place]) 08/13/2016   Secondary hyperparathyroidism of renal origin ([place]) 08/13/2016   Tobacco abuse 08/04/2016    Past Surgical History:  Procedure Laterality Date   BASCILIC VEIN TRANSPOSITION Left 08/07/2016   Procedure: LEFT BASILIC VEIN TRANSPOSITION;  Surgeon: Angelia Mould, MD;  Location: [place];  Service: Vascular;  Laterality: Left;   CLOSED REDUCTION NASAL FRACTURE N/A 08/11/2019   Procedure: CLOSED REDUCTION NASAL FRACTURE WITH STABILIZATION;  Surgeon: Irene Limbo, MD;  Location: [place];  Service: Plastics;  Laterality: N/A;   COLONOSCOPY N/A 08/12/2016   Procedure: COLONOSCOPY;  Surgeon: Otis Brace, MD;  Location: [place];  Service: Gastroenterology;  Laterality: N/A;   ESOPHAGOGASTRODUODENOSCOPY N/A 08/12/2016   Procedure: ESOPHAGOGASTRODUODENOSCOPY (EGD);  Surgeon: Otis Brace,  MD;  Location: [place];  Service: Gastroenterology;  Laterality: N/A;   INSERTION OF DIALYSIS CATHETER N/A 08/07/2016   Procedure: INSERTION OF TUNNELED DIALYSIS CATHETER;  Surgeon: Angelia Mould, MD;  Location: [place];  Service:  Vascular;  Laterality: N/A;   LIGATION OF ARTERIOVENOUS  FISTULA Left 09/12/2016   Procedure: BANDING OF LEFT  ARTERIOVENOUS  FISTULA;  Surgeon: Angelia Mould, MD;  Location: [place];  Service: Vascular;  Laterality: Left;   LOWER EXTREMITY INTERVENTION Right 12/02/2018   Procedure: LOWER EXTREMITY INTERVENTION;  Surgeon: Algernon Huxley, MD;  Location: [place] [facility];  Service: Cardiovascular;  Laterality: Right;    Prior to Admission medications   Medication Sig Start Date End Date Taking? Authorizing Provider  AURYXIA 1 GM 210 MG(Fe) tablet Take 420 mg by mouth every Monday, Wednesday, and Friday with hemodialysis. 11/13/20   [provider]  calcium acetate (PHOSLO) 667 MG capsule Take 2 capsules (1,334 mg total) by mouth 3 (three) times daily with meals. 03/11/21   Lorella Nimrod, MD  cinacalcet (SENSIPAR) 60 MG tablet SMARTSIG:2 Tablet(s) By Mouth Every Evening 03/18/21   [provider]  epoetin alfa (EPOGEN) 4000 UNIT/ML injection Inject 1 mL (4,000 Units total) into the vein every Monday, Wednesday, and Friday with hemodialysis. 06/22/20   Enzo Bi, MD  fluticasone (FLONASE) 50 MCG/ACT nasal spray Place 1 spray into both nostrils daily for 15 days. 03/27/21 04/11/21  Arrien, Jimmy Picket, MD  hydrOXYzine (VISTARIL) 50 MG capsule Take 1 capsule (50 mg total) by mouth 3 (three) times daily as needed. 02/26/21   Cuthriell, Charline Bills, PA-C  Lidocaine 3 % CREA Apply 1 application topically 2 (two) times daily as needed (pain). 02/26/21   Cuthriell, Charline Bills, PA-C  thiamine 100 MG tablet Take 1 tablet (100 mg total) by mouth daily. 03/11/21   Lorella Nimrod, MD  VELPHORO 500 MG chewable tablet Chew 500 mg by mouth 3 (three) times daily. 03/15/21   [provider]  witch hazel-glycerin (TUCKS) pad Apply topically as needed for itching, hemorrhoids or irritation. 03/26/21   Arrien, Jimmy Picket, MD    Allergies Lisinopril  Family History  Problem Relation Age of  Onset   Diabetes Mother    Kidney failure Mother    Healthy Father    Kidney failure Brother    Healthy Sister    Kidney disease Daughter    Post-traumatic stress disorder Neg Hx    Bladder Cancer Neg Hx    Kidney cancer Neg Hx     Social History Social History   Tobacco Use   Smoking status: Every Day    Packs/day: 0.50    Years: 40.00    Pack years: 20.00    Types: Cigarettes   Smokeless tobacco: Never  Vaping Use   Vaping Use: Never used  Substance Use Topics   Alcohol use: Yes    Alcohol/week: 21.0 standard drinks    Types: 21 Cans of beer per week    Comment: last drink 6 months ago   Drug use: No    Review of Systems Constitutional: No fever/chills Eyes: No visual changes. ENT: No sore throat. Cardiovascular: Endorses chest pain.  Endorses bilateral lower extremity edema Respiratory: Endorses shortness of breath. Gastrointestinal: No abdominal pain.  No nausea, no vomiting.  No diarrhea. Genitourinary: Negative for dysuria. Musculoskeletal: Negative for acute arthralgias Skin: Negative for rash. Neurological: Negative for headaches, weakness/numbness/paresthesias in any extremity Psychiatric: Negative for suicidal ideation/homicidal ideation   ____________________________________________  PHYSICAL EXAM:  VITAL SIGNS: ED Triage Vitals  Enc Vitals Group     BP --      Pulse --      Resp --      Temp 04/08/21 1143 98.8 F (37.1 C)     Temp Source 04/08/21 1143 Oral     SpO2 --      Weight 04/08/21 1146 170 lb (77.1 kg)     Height 04/08/21 1146 '5\' 4"'$  (1.626 m)     Head Circumference --      Peak Flow --      Pain Score 04/08/21 1146 0     Pain Loc --      Pain Edu? --      Excl. in [place]? --    Constitutional: Alert and oriented. Well appearing and in no acute distress. Eyes: Conjunctivae are normal. PERRL. Head: Atraumatic. Nose: No congestion/rhinnorhea. Mouth/Throat: Mucous membranes are moist. Neck: No stridor Cardiovascular: Grossly  normal heart sounds.  Good peripheral circulation.  Bilateral lower extremity edema Respiratory: Increased respiratory effort.  2 L nasal cannula in place.  No retractions. Gastrointestinal: Soft and nontender. No distention. Musculoskeletal: No obvious deformities Neurologic:  Normal speech and language. No gross focal neurologic deficits are appreciated. Skin:  Skin is warm and dry. No rash noted. Psychiatric: Mood and affect are normal. Speech and behavior are normal.  ____________________________________________   LABS (all labs ordered are listed, but only abnormal results are displayed)  Labs Reviewed  COMPREHENSIVE METABOLIC PANEL - Abnormal; Notable for the following components:      Result Value   Glucose, Bld 122 (*)    BUN 111 (*)    Creatinine, Ser 13.26 (*)    Calcium 8.6 (*)    Albumin 3.2 (*)    Total Bilirubin 1.5 (*)    GFR, Estimated 4 (*)    Anion gap 21 (*)    All other components within normal limits  BRAIN NATRIURETIC PEPTIDE - Abnormal; Notable for the following components:   B Natriuretic Peptide 2,828.4 (*)    All other components within normal limits  CBC WITH DIFFERENTIAL/PLATELET - Abnormal; Notable for the following components:   RBC 2.04 (*)    Hemoglobin 6.7 (*)    HCT 21.5 (*)    MCV 105.4 (*)    RDW 17.1 (*)    Platelets 109 (*)    Lymphs Abs 0.6 (*)    All other components within normal limits  TROPONIN I (HIGH SENSITIVITY) - Abnormal; Notable for the following components:   Troponin I (High Sensitivity) 250 (*)    All other components within normal limits  RESP PANEL BY RT-PCR (FLU A&B, COVID) ARPGX2  PREPARE RBC (CROSSMATCH)  TROPONIN I (HIGH SENSITIVITY)   ____________________________________________  EKG  ED ECG REPORT I, Naaman Plummer, the attending physician, personally viewed and interpreted this ECG.  Date: 04/08/2021 EKG Time: 1313 Rate: 94 Rhythm: normal sinus rhythm QRS Axis: Right deviation Intervals: normal ST/T  Wave abnormalities: normal Narrative Interpretation: no evidence of acute ischemia  ____________________________________________  RADIOLOGY  ED MD interpretation: One-view portable x-ray of the chest shows diffuse bilateral interstitial thickening and cardiomegaly concerning for pulmonary edema  Official radiology report(s): DG Chest Port 1 View  Result Date: 04/08/2021 CLINICAL DATA:  Shortness of breath EXAM: PORTABLE CHEST 1 VIEW COMPARISON:  03/28/2021 FINDINGS: Bilateral diffuse mild interstitial thickening. No focal consolidation. No pleural effusion or pneumothorax. Stable cardiomegaly. No acute osseous abnormality. IMPRESSION: Bilateral diffuse interstitial thickening and  cardiomegaly concerning for mild pulmonary edema. Electronically Signed   By: Kathreen Devoid   On: 04/08/2021 13:54    ____________________________________________   PROCEDURES  Procedure(s) performed (including Critical Care):  .1-3 Lead EKG Interpretation  Date/Time: 04/08/2021 2:45 PM Performed by: Naaman Plummer, MD Authorized by: Naaman Plummer, MD     Interpretation: normal     ECG rate:  78   ECG rate assessment: normal     Rhythm: sinus rhythm     Ectopy: none     Conduction: normal   .Critical Care  Date/Time: 04/08/2021 2:46 PM Performed by: Naaman Plummer, MD Authorized by: Naaman Plummer, MD   Critical care provider statement:    Critical care time (minutes):  33   Critical care time was exclusive of:  Separately billable procedures and treating other patients   Critical care was necessary to treat or prevent imminent or life-threatening deterioration of the following conditions:  Respiratory failure   Critical care was time spent personally by me on the following activities:  Discussions with consultants, evaluation of patient's response to treatment, examination of patient, ordering and performing treatments and interventions, ordering and review of laboratory studies, ordering and  review of radiographic studies, pulse oximetry, re-evaluation of patient's condition, obtaining history from patient or surrogate and review of old charts   I assumed direction of critical care for this patient from another provider in my specialty: no     Care discussed with: admitting provider     ____________________________________________   INITIAL IMPRESSION / ASSESSMENT AND PLAN / ED COURSE  As part of my medical decision making, I reviewed the following data within the electronic medical record, if available:  Nursing notes reviewed and incorporated, Labs reviewed, EKG interpreted, Old chart reviewed, Radiograph reviewed and Notes from prior ED visits reviewed and incorporated     + dyspnea +LE edema + Non adherence to medication regimen  Workup: ECG, CBC, BMP, Troponin, BNP, CXR Findings: EKG: No STEMI and no evidence of Brugadas sign, delta wave, epsilon wave, significantly prolonged QTc, or malignant arrhythmia. BNP: 2828 CXR: Pulmonary edema Based on history, exam and findings, presentation most consistent with acute on chronic heart failure. Low suspicion for PNA, ACS, tamponade, aortic dissection. Interventions: Oxygen, Dialysis  Disposition (ESRD, missed HD): Plan admission to medicine with renal consult for expedited HD.      ____________________________________________   FINAL CLINICAL IMPRESSION(S) / ED DIAGNOSES  Final diagnoses:  Non-intractable vomiting with nausea, unspecified vomiting type  Acute pulmonary edema (HCC)  Shortness of breath  Acute respiratory failure with hypoxia (HCC)  Bilateral lower extremity edema  Chest pain, unspecified type  Other hypervolemia     ED Discharge Orders     None        Note:  This document was prepared using Dragon voice recognition software and may include unintentional dictation errors.    Naaman Plummer, MD 04/08/21 [phone]

## 2021-04-08 NOTE — ED Triage Notes (Signed)
Dialysis patient; last on wednesday; missed dialysis today because car ran out of gas; has fluid retention on peripheral extremities; was coughing up brown stuff; pt was 80% on RA; was given 2L now 93%

## 2021-04-08 NOTE — Progress Notes (Signed)
Pt irritated that he cannot order food from cafeteria while in HD. Offered patient graham crackers and applesauce. Reminded him that his unit will have pt trays available in nutrition room. Pt states, "they are nasty and I will have someone bring me food." Reminded pt of confidential patient' status on chart. Pt says he does not know why he is listed as that.

## 2021-04-08 NOTE — Progress Notes (Signed)
Pre HD info 

## 2021-04-08 NOTE — Progress Notes (Signed)
[facility]  ROUNDING NOTE   Subjective:   Jordan [name] is a 55 y.o. male with medical concerns including anemia, dyspnea, hypertension, and ESRD on dialysis. He presented to the ED early this morning with complaints of shortness of breath and chest pain.   We have been consulted to manage hemodialysis during this admission. Patient currently receive dialysis at [place], [facility]. His last treatment was on last Wednesday at outpatient clinic. Patient states he missed his treatment on Friday due to being incarcerated. Currently short of breath on 2L [place]. Denies nausea, vomiting and diarrhea. Hgb 6.7, Na 142, K 4.4, BUN 111, Creatinine 13.26 and eGFR 4. Requesting meal tray. Continues to complain of loss of vision in left eye.    Objective:  Vital signs in last 24 hours:  Temp:  [98.8 F (37.1 C)] 98.8 F (37.1 C) (07/18 1143) Pulse Rate:  [92-107] 107 (07/18 1330) Resp:  [18] 18 (07/18 1330) BP: (106-113)/(61-77) 106/61 (07/18 1330) SpO2:  [28 %-97 %] 28 % (07/18 1330) Weight:  [77.1 kg] 77.1 kg (07/18 1146)  Weight change:  Filed Weights   04/08/21 1146  Weight: 77.1 kg    Intake/Output: No intake/output data recorded.   Intake/Output this shift:  No intake/output data recorded.  Physical Exam: General: NAD, resting on stretcher  Head: Normocephalic, atraumatic. Moist oral mucosal membranes  Eyes: Anicteric  Lungs:  Clear to auscultation, normal effort, 2L [place]  Heart: Regular rate and rhythm  Abdomen:  Soft, nontender  Extremities:  1+ peripheral edema.  Neurologic: Nonfocal, moving all four extremities  Skin: No lesions  Access: Lt AVF    Basic Metabolic Panel: Recent Labs  Lab 04/08/21 1149  NA 142  K 4.4  CL 99  CO2 22  GLUCOSE 122*  BUN 111*  CREATININE 13.26*  CALCIUM 8.6*    Liver Function Tests: Recent Labs  Lab 04/08/21 1149  AST 22  ALT 16  ALKPHOS 102  BILITOT 1.5*  PROT 7.1  ALBUMIN 3.2*    No results for input(s): LIPASE, AMYLASE in the last 168 hours. No results for input(s): AMMONIA in the last 168 hours.  CBC: Recent Labs  Lab 04/08/21 1149  WBC 7.8  NEUTROABS 6.2  HGB 6.7*  HCT 21.5*  MCV 105.4*  PLT 109*    Cardiac Enzymes: No results for input(s): CKTOTAL, CKMB, CKMBINDEX, TROPONINI in the last 168 hours.  BNP: Invalid input(s): POCBNP  CBG: No results for input(s): GLUCAP in the last 168 hours.  Microbiology: Results for orders placed or performed during the hospital encounter of 03/20/21  Resp Panel by RT-PCR (Flu A&B, Covid) Nasopharyngeal Swab     Status: None   Collection Time: 03/20/21 10:51 AM   Specimen: Nasopharyngeal Swab; Nasopharyngeal(NP) swabs in vial transport medium  Result Value Ref Range Status   SARS Coronavirus 2 by RT PCR NEGATIVE NEGATIVE Final    Comment: (NOTE) SARS-CoV-2 target nucleic acids are NOT DETECTED.  The SARS-CoV-2 RNA is generally detectable in upper respiratory specimens during the acute phase of infection. The lowest concentration of SARS-CoV-2 viral copies this assay can detect is 138 copies/mL. A negative result does not preclude SARS-Cov-2 infection and should not be used as the sole basis for treatment or other patient management decisions. A negative result may occur with  improper specimen collection/handling, submission of specimen other than nasopharyngeal swab, presence of viral mutation(s) within the areas targeted by this assay, and inadequate number of viral copies(<138  copies/mL). A negative result must be combined with clinical observations, patient history, and epidemiological information. The expected result is Negative.  Fact Sheet for Patients:  EntrepreneurPulse.com.au  Fact Sheet for Healthcare Providers:  IncredibleEmployment.be  This test is no t yet approved or cleared by the [place] FDA and  has been authorized for detection and/or  diagnosis of SARS-CoV-2 by FDA under an Emergency Use Authorization (EUA). This EUA will remain  in effect (meaning this test can be used) for the duration of the COVID-19 declaration under Section 564(b)(1) of the Act, 21 U.S.C.section 360bbb-3(b)(1), unless the authorization is terminated  or revoked sooner.       Influenza A by PCR NEGATIVE NEGATIVE Final   Influenza B by PCR NEGATIVE NEGATIVE Final    Comment: (NOTE) The Xpert Xpress SARS-CoV-2/FLU/RSV plus assay is intended as an aid in the diagnosis of influenza from Nasopharyngeal swab specimens and should not be used as a sole basis for treatment. Nasal washings and aspirates are unacceptable for Xpert Xpress SARS-CoV-2/FLU/RSV testing.  Fact Sheet for Patients: EntrepreneurPulse.com.au  Fact Sheet for Healthcare Providers: IncredibleEmployment.be  This test is not yet approved or cleared by the [place] FDA and has been authorized for detection and/or diagnosis of SARS-CoV-2 by FDA under an Emergency Use Authorization (EUA). This EUA will remain in effect (meaning this test can be used) for the duration of the COVID-19 declaration under Section 564(b)(1) of the Act, 21 U.S.C. section 360bbb-3(b)(1), unless the authorization is terminated or revoked.  Performed at [facility], [address] [place], [place] [address]     Coagulation Studies: No results for input(s): LABPROT, INR in the last 72 hours.  Urinalysis: No results for input(s): COLORURINE, LABSPEC, PHURINE, GLUCOSEU, HGBUR, BILIRUBINUR, KETONESUR, PROTEINUR, UROBILINOGEN, NITRITE, LEUKOCYTESUR in the last 72 hours.  Invalid input(s): APPERANCEUR    Imaging: DG Chest Port 1 View  Result Date: 04/08/2021 CLINICAL DATA:  Shortness of breath EXAM: PORTABLE CHEST 1 VIEW COMPARISON:  03/28/2021 FINDINGS: Bilateral diffuse mild interstitial thickening. No focal consolidation. No pleural effusion or  pneumothorax. Stable cardiomegaly. No acute osseous abnormality. IMPRESSION: Bilateral diffuse interstitial thickening and cardiomegaly concerning for mild pulmonary edema. Electronically Signed   By: Kathreen Devoid   On: 04/08/2021 13:54     Medications:    sodium chloride      [START ON 04/09/2021] Chlorhexidine Gluconate Cloth  6 each Topical Q0600   ipratropium  2 puff Inhalation Q6H   nicotine  21 mg Transdermal Daily   acetaminophen, albuterol, dextromethorphan-guaiFENesin, hydrALAZINE, nitroGLYCERIN, ondansetron (ZOFRAN) IV, senna-docusate  Assessment/ Plan:  [name] is a 55 y.o.  male with medical concerns including anemia, dyspnea, hypertension, and ESRD on dialysis. He presented to the ED early this morning with complaints of shortness of breath and chest pain.  [facility] [address]/MWF/Lt AVF  Anemia of chronic kidney disease  Lab Results  Component Value Date   HGB 6.7 (L) 04/08/2021  Hgb below target 1 unit RBCs ordered [place] and EPO outpatient Will continue EPO with treatments  2. End stage renal disease on dialysis Will maintain outpatient schedule during this admission Scheduled for dialysis today UF goal 2L as tolerated.  Next treatment scheduled for Wednesday  3. Secondary Hyperparathyroidism:   Lab Results  Component Value Date   PTH 669 (H) 02/20/2020   CALCIUM 8.6 (L) 04/08/2021   CAION 1.26 04/07/2020   PHOS 10.9 (H) 03/25/2021   Phosphorus and calcium not at goal [place] ordered as  outpateint      LOS: 0   7/18/20223:50 PM

## 2021-04-09 DIAGNOSIS — J9601 Acute respiratory failure with hypoxia: Secondary | ICD-10-CM | POA: Diagnosis not present

## 2021-04-09 DIAGNOSIS — J449 Chronic obstructive pulmonary disease, unspecified: Secondary | ICD-10-CM

## 2021-04-09 DIAGNOSIS — I5032 Chronic diastolic (congestive) heart failure: Secondary | ICD-10-CM | POA: Diagnosis not present

## 2021-04-09 DIAGNOSIS — N186 End stage renal disease: Secondary | ICD-10-CM | POA: Diagnosis not present

## 2021-04-09 LAB — TROPONIN I (HIGH SENSITIVITY): Troponin I (High Sensitivity): 237 ng/L (ref <18)

## 2021-04-09 LAB — HEPATITIS B SURFACE ANTIGEN: Hepatitis B Surface Ag: NONREACTIVE

## 2021-04-09 MED ORDER — METHYLPREDNISOLONE SODIUM SUCC 125 MG IJ SOLR: 60.0000 mg | Freq: Once | INTRAMUSCULAR | Status: DC

## 2021-04-09 MED ORDER — CLOBETASOL PROPIONATE 0.05 % EX CREA
TOPICAL_CREAM | Freq: Two times a day (BID) | CUTANEOUS | Status: DC
  Filled 2021-04-09: qty 15

## 2021-04-09 NOTE — Discharge Summary (Addendum)
Physician Discharge Summary  Jordan [name] [id number] DOB: 1966-02-25 DOA: 04/08/2021  PCP: Pcp, No  Admit date: 04/08/2021 Discharge date: 04/09/2021  Recommendations for Outpatient Follow-up:  None, pt left on AMA  Home Health:none Equipment/Devices: none  Discharge Condition: CODE STATUS: full Diet recommendation: renal diet  Brief/Interim Summary (HPI)  Jordan [name] is a 55 y.o. male with medical history significant of hypertension, alcohol abuse in remission, tobacco abuse, COPD on prn 2L Oxygen at home, anemia, ESRD-HD (MWF), noncompliance to dialysis, who presents with shortness of breath.   Patient has history of noncompliance to dialysis. Patient's last dialysis was on last Wednesday.  He developed shortness of breath, which has been progressively worsening.  Patient is still he has cough with brownish colored mucus production.  No chest pain, fever or chills.  Patient is constipated, denies nausea vomiting or abdominal pain. No rectal bleeding.    ED Course: pt was found to have WBC 7.8, troponin level 250, BNP 2828, hemoglobin 6.7 (8.9 on 03/28/2021), pending COVID PCR, potassium 4.4, bicarbonate 22, creatinine 13.26, BUN 111, temperature normal, oxygen saturation 80% on room air, which improved to 93% on 2 L oxygen.  Chest x-ray showed cardiomegaly and mild pulmonary edema.  Patient is placed on progressive bed for observation, Dr. Candiss Norse of nephrology is consulted for dialysis.   Subjective  -SOB  Discharge Diagnoses and Hospital Course:   Principal Problem:   Acute respiratory failure with hypoxia (HCC) Active Problems:   Tobacco abuse   ESRD on dialysis ([facility])   Essential hypertension   Elevated troponin   Anemia in ESRD (end-stage renal disease) (HCC)   Fluid overload   PAF (paroxysmal atrial fibrillation) (HCC)   Chronic diastolic CHF (congestive heart failure) (HCC)   Thrombocytopenia (HCC)   COPD (chronic obstructive pulmonary disease)  (HCC)     Acute respiratory failure with hypoxia: likely multifactorial etiology, including fluid overload 2/2 noncompliance with dialysis, COPD, chronic dCHF.  He has worsening anemia which may have contributed to his shortness of breath supportively.  Assessment and plan was made as below. Dr. Candiss Norse of renal was consulted for dialysis.  Patient was treated with bronchodilators.  Per report, "Pt ended HD treatment 15 minutes early AMA. 1.7L fluid removed and did not meet UF goal of 2L". This happened last night. I did not have chance to see pt.   Tobacco abuse -Nicotine patch   ESRD on dialysis ([facility]): -HD per renal    Essential hypertension: pt is not taking meds now. Bp 106/61 -IV hydralazine prn in hospital -Continued oral hydralazine   Anemia in ESRD (end-stage renal disease) ([facility]): Hemoglobin 8.9 on 7/7/222 --> 6.7. no active bleeding - 1U of blood is ordered for transfusion by EDP - continued Auryxia   Emphysema of lung/COPD -Bronchodilators   PAF (paroxysmal atrial fibrillation) [facility]): Patient is not taking anticoagulants.  Heart rate 107 -Cardiac monitor   Chronic diastolic CHF (congestive heart failure) ([facility]): 2D echo on 03/02/2021 showed EF of 50% with grade 2 diastolic dysfunction.  -Volume management per renal by dialysis   Elevated troponin: Troponin 250.  Patient has chronically elevated troponin 121-220 recently.  No chest pain.  Likely due to decreased clearance secondary to ESRD. -Trended  troponin -Checked FLP (patient had A1c 5.0 on 04/07/2020)   Thrombocytopenia [facility]): this is a chronic issue.  Platelet of 109.  No active bleeding. -Follow-up with CBC   Rash: Etiology is not clear, patient has itches. -Prn hydroxyzine     Discharge  Instructions   Allergies as of 04/09/2021       Reactions   Lisinopril Swelling     Med Rec must be completed prior to using this [place]       Allergies  Allergen Reactions   Lisinopril Swelling     Consultations: -Nephrology   Procedures/Studies: DG Chest 2 View  Result Date: 03/28/2021 CLINICAL DATA:  Shortness of breath EXAM: CHEST - 2 VIEW COMPARISON:  03/25/2021 FINDINGS: Cardiomegaly with pulmonary vascular congestion. Very mild interstitial edema is possible, although this appearance is slightly improved from the prior. Mild right basilar opacity, favoring atelectasis over pneumonia. No pleural effusion or pneumothorax. Thoracic aortic atherosclerosis. IMPRESSION: Cardiomegaly with possible mild interstitial edema, although improved. Mild right basilar atelectasis. Electronically Signed   By: Julian Hy M.D.   On: 03/28/2021 01:02   DG Chest 2 View  Result Date: 03/25/2021 CLINICAL DATA:  Chest pain for several days. EXAM: CHEST - 2 VIEW COMPARISON:  03/20/2021 FINDINGS: Stable large cardiac silhouette. Fine airspace disease similar to prior. No pleural fluid. No pneumothorax. Low lung volumes. IMPRESSION: Cardiomegaly and mild pulmonary edema pattern. Electronically Signed   By: Suzy Bouchard M.D.   On: 03/25/2021 10:08   DG Chest 2 View  Result Date: 03/20/2021 CLINICAL DATA:  Shortness of breath. EXAM: CHEST - 2 VIEW COMPARISON:  03/10/2021 FINDINGS: Coarse lung markings are similar to the prior examinations and appears to be related to chronic changes. Stable cardiomegaly. Atherosclerotic calcifications at the aortic arch. No pleural effusions. Again noted is sclerosis of the bones and compatible with chronic renal disease. IMPRESSION: 1. Stable cardiomegaly. 2. Chronic lung findings without acute changes. Electronically Signed   By: Markus Daft M.D.   On: 03/20/2021 08:14   DG Chest 2 View  Result Date: 03/10/2021 CLINICAL DATA:  Shortness of breath EXAM: CHEST - 2 VIEW COMPARISON:  03/01/2021 FINDINGS: Cardiac shadow remains enlarged. Aortic calcifications are again seen. Vascular congestion is again noted and stable. Interstitial changes are noted unchanged from the  prior exam without focal infiltrate or sizable effusion. No bony abnormality is noted. IMPRESSION: Stable interstitial changes of a chronic nature. No acute abnormality is noted. Electronically Signed   By: Inez Catalina M.D.   On: 03/10/2021 22:02   CT HEAD WO CONTRAST  Result Date: 03/11/2021 CLINICAL DATA:  55 y.o. male with medical history significant for End-stage renal disease on hemodialysis MWF, combined heart failure, hypertension, paroxysmal atrial fibrillation, COPD, noncompliance with dialysis medication, presents to the emergency department for chief concerns of shortness of breath. EXAM: CT HEAD WITHOUT CONTRAST TECHNIQUE: Contiguous axial images were obtained from the base of the skull through the vertex without intravenous contrast. COMPARISON:  07/22/2019. FINDINGS: Brain: No evidence of acute infarction, hemorrhage, hydrocephalus, extra-axial collection or mass lesion/mass effect. Patchy areas of white matter hypoattenuation are noted consistent with mild chronic microvascular ischemic change, stable. Prominent sulcus, with possible small old infarct, right cerebellum, also stable. Vascular: No hyperdense vessel. Advanced carotid vascular calcification for age, extending into the middle cerebral arteries. Skull: Normal. Negative for fracture or focal lesion. Sinuses/Orbits: Globes and orbits are unremarkable. Dependent secretions in the visualized left maxillary sinus. Remaining sinuses are clear. Other: None. IMPRESSION: 1. No acute intracranial abnormalities. 2. Mild chronic microvascular ischemic change. 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 Electronically Signed   By: Lajean Manes M.D.   On: 03/11/2021 18:37   MR BRAIN WO CONTRAST  Result Date: 03/21/2021 CLINICAL DATA:  Monocular left eye vision loss.  EXAM: MRI HEAD AND ORBITS WITHOUT CONTRAST TECHNIQUE: Multiplanar, multi-echo pulse sequences of the brain and surrounding structures were acquired without intravenous contrast.  Multiplanar, multi-echo pulse sequences of the orbits and surrounding structures were acquired including fat saturation techniques, without intravenous contrast administration. COMPARISON:  CT head March 11, 2021. FINDINGS: Significantly motion limited and incomplete study with patient refusing contrast. Within this limitation: MRI HEAD FINDINGS Brain: No acute infarction, hemorrhage, hydrocephalus, extra-axial collection or mass lesion. Age advanced T2/FLAIR hyperintensities in the white matter and pons. Remote bilateral cerebellar lacunar infarcts. Vascular: Major arterial flow voids are maintained at the skull base. Skull and upper cervical spine: Diffuse T1 hypointensity of the marrow, most likely related to the patient's known anemia. Other: Large right and small left mastoid effusions. MRI ORBITS FINDINGS Orbits: Limited evaluation without obvious orbital mass or evidence of inflammation. Grossly normal appearance of the globes, optic nerve-sheath complexes, and lacrimal glands. Bilateral exophthalmos. Prominent retro bulbar fat with mildly enlarged extraocular muscles with sparing of the tendinous junction (for example see series 12, image 24). Visualized sinuses: Clear sinuses. Soft tissues: Normal. IMPRESSION: MRI Head: 1. Significantly motion limited and incomplete study with the patient refusing contrast. Within this limitation, no evidence of acute intracranial abnormality. No evidence of acute infarct on the diagnostic diffusion imaging. 2. Age advanced T2/FLAIR hyperintensities within the white matter, most likely related to chronic microvascular ischemic disease given the patient's risk factors (including CKD and hypertension). Demyelinating disease could have a similar appearance. 3. Remote bilateral cerebellar lacunar infarcts. 4. Large right and small left mastoid effusions. MRI Orbits: 1. Prominent retrobulbar fat, bilateral exophthalmos, and mildly enlarged extraocular muscles bilaterally with  sparing of the tendinous junction. Constellation of findings can be seen with thyroid-associated orbitopathy. Recommend correlation with TSH. 2. Otherwise, no evidence of acute orbital abnormality on this motion limited noncontrast study. Electronically Signed   By: Margaretha Sheffield MD   On: 03/21/2021 12:23   DG Chest Port 1 View  Result Date: 04/08/2021 CLINICAL DATA:  Shortness of breath EXAM: PORTABLE CHEST 1 VIEW COMPARISON:  03/28/2021 FINDINGS: Bilateral diffuse mild interstitial thickening. No focal consolidation. No pleural effusion or pneumothorax. Stable cardiomegaly. No acute osseous abnormality. IMPRESSION: Bilateral diffuse interstitial thickening and cardiomegaly concerning for mild pulmonary edema. Electronically Signed   By: Kathreen Devoid   On: 04/08/2021 13:54   MR ORBITS WO CONTRAST  Result Date: 03/21/2021 CLINICAL DATA:  Monocular left eye vision loss. EXAM: MRI HEAD AND ORBITS WITHOUT CONTRAST TECHNIQUE: Multiplanar, multi-echo pulse sequences of the brain and surrounding structures were acquired without intravenous contrast. Multiplanar, multi-echo pulse sequences of the orbits and surrounding structures were acquired including fat saturation techniques, without intravenous contrast administration. COMPARISON:  CT head March 11, 2021. FINDINGS: Significantly motion limited and incomplete study with patient refusing contrast. Within this limitation: MRI HEAD FINDINGS Brain: No acute infarction, hemorrhage, hydrocephalus, extra-axial collection or mass lesion. Age advanced T2/FLAIR hyperintensities in the white matter and pons. Remote bilateral cerebellar lacunar infarcts. Vascular: Major arterial flow voids are maintained at the skull base. Skull and upper cervical spine: Diffuse T1 hypointensity of the marrow, most likely related to the patient's known anemia. Other: Large right and small left mastoid effusions. MRI ORBITS FINDINGS Orbits: Limited evaluation without obvious orbital mass  or evidence of inflammation. Grossly normal appearance of the globes, optic nerve-sheath complexes, and lacrimal glands. Bilateral exophthalmos. Prominent retro bulbar fat with mildly enlarged extraocular muscles with sparing of the tendinous junction (for example see series 12,  image 24). Visualized sinuses: Clear sinuses. Soft tissues: Normal. IMPRESSION: MRI Head: 1. Significantly motion limited and incomplete study with the patient refusing contrast. Within this limitation, no evidence of acute intracranial abnormality. No evidence of acute infarct on the diagnostic diffusion imaging. 2. Age advanced T2/FLAIR hyperintensities within the white matter, most likely related to chronic microvascular ischemic disease given the patient's risk factors (including CKD and hypertension). Demyelinating disease could have a similar appearance. 3. Remote bilateral cerebellar lacunar infarcts. 4. Large right and small left mastoid effusions. MRI Orbits: 1. Prominent retrobulbar fat, bilateral exophthalmos, and mildly enlarged extraocular muscles bilaterally with sparing of the tendinous junction. Constellation of findings can be seen with thyroid-associated orbitopathy. Recommend correlation with TSH. 2. Otherwise, no evidence of acute orbital abnormality on this motion limited noncontrast study. Electronically Signed   By: Margaretha Sheffield MD   On: 03/21/2021 12:23      Discharge Exam: Vitals:   04/09/21 0423 04/09/21 0717  BP: (!) 141/91 (!) 132/92  Pulse: 92 89  Resp: 18 18  Temp: 98.2 F (36.8 C) 97.7 F (36.5 C)  SpO2: 97% (!) 86%   Vitals:   04/08/21 2317 04/09/21 0209 04/09/21 0423 04/09/21 0717  BP: 109/61 121/73 (!) 141/91 (!) 132/92  Pulse: 91 89 92 89  Resp: '20 16 18 18  '$ Temp: 97.7 F (36.5 C) 97.9 F (36.6 C) 98.2 F (36.8 C) 97.7 F (36.5 C)  TempSrc: Oral (P) Oral    SpO2: 96% 100% 97% (!) 86%  Weight:      Height:       Physical examination was not done since patient left AMA, I  was not in hospital.   The results of significant diagnostics from this hospitalization (including imaging, microbiology, ancillary and laboratory) are listed below for reference.     Microbiology: Recent Results (from the past 240 hour(s))  Resp Panel by RT-PCR (Flu A&B, Covid) Nasopharyngeal Swab     Status: None   Collection Time: 04/08/21  2:58 PM   Specimen: Nasopharyngeal Swab; Nasopharyngeal(NP) swabs in vial transport medium  Result Value Ref Range Status   SARS Coronavirus 2 by RT PCR NEGATIVE NEGATIVE Final    Comment: (NOTE) SARS-CoV-2 target nucleic acids are NOT DETECTED.  The SARS-CoV-2 RNA is generally detectable in upper respiratory specimens during the acute phase of infection. The lowest concentration of SARS-CoV-2 viral copies this assay can detect is 138 copies/mL. A negative result does not preclude SARS-Cov-2 infection and should not be used as the sole basis for treatment or other patient management decisions. A negative result may occur with  improper specimen collection/handling, submission of specimen other than nasopharyngeal swab, presence of viral mutation(s) within the areas targeted by this assay, and inadequate number of viral copies(<138 copies/mL). A negative result must be combined with clinical observations, patient history, and epidemiological information. The expected result is Negative.  Fact Sheet for Patients:  EntrepreneurPulse.com.au  Fact Sheet for Healthcare Providers:  IncredibleEmployment.be  This test is no t yet approved or cleared by the [place] FDA and  has been authorized for detection and/or diagnosis of SARS-CoV-2 by FDA under an Emergency Use Authorization (EUA). This EUA will remain  in effect (meaning this test can be used) for the duration of the COVID-19 declaration under Section 564(b)(1) of the Act, 21 U.S.C.section 360bbb-3(b)(1), unless the authorization is terminated  or  revoked sooner.       Influenza A by PCR NEGATIVE NEGATIVE Final   Influenza B by  PCR NEGATIVE NEGATIVE Final    Comment: (NOTE) The Xpert Xpress SARS-CoV-2/FLU/RSV plus assay is intended as an aid in the diagnosis of influenza from Nasopharyngeal swab specimens and should not be used as a sole basis for treatment. Nasal washings and aspirates are unacceptable for Xpert Xpress SARS-CoV-2/FLU/RSV testing.  Fact Sheet for Patients: EntrepreneurPulse.com.au  Fact Sheet for Healthcare Providers: IncredibleEmployment.be  This test is not yet approved or cleared by the [place] FDA and has been authorized for detection and/or diagnosis of SARS-CoV-2 by FDA under an Emergency Use Authorization (EUA). This EUA will remain in effect (meaning this test can be used) for the duration of the COVID-19 declaration under Section 564(b)(1) of the Act, 21 U.S.C. section 360bbb-3(b)(1), unless the authorization is terminated or revoked.  Performed at [facility], [address] [place], [place] [address]      Labs: BNP (last 3 results) Recent Labs    02/26/21 1700 03/10/21 2128 04/08/21 1149  BNP 2,730.3* 2,267.3* [id number]*   Basic Metabolic Panel: Recent Labs  Lab 04/08/21 1149  NA 142  K 4.4  CL 99  CO2 22  GLUCOSE 122*  BUN 111*  CREATININE 13.26*  CALCIUM 8.6*   Liver Function Tests: Recent Labs  Lab 04/08/21 1149  AST 22  ALT 16  ALKPHOS 102  BILITOT 1.5*  PROT 7.1  ALBUMIN 3.2*   No results for input(s): LIPASE, AMYLASE in the last 168 hours. No results for input(s): AMMONIA in the last 168 hours. CBC: Recent Labs  Lab 04/08/21 1149  WBC 7.8  NEUTROABS 6.2  HGB 6.7*  HCT 21.5*  MCV 105.4*  PLT 109*   Cardiac Enzymes: No results for input(s): CKTOTAL, CKMB, CKMBINDEX, TROPONINI in the last 168 hours. BNP: Invalid input(s): POCBNP CBG: No results for input(s): GLUCAP in the last 168  hours. D-Dimer No results for input(s): DDIMER in the last 72 hours. Hgb A1c No results for input(s): HGBA1C in the last 72 hours. Lipid Profile No results for input(s): CHOL, HDL, LDLCALC, TRIG, CHOLHDL, LDLDIRECT in the last 72 hours. Thyroid function studies No results for input(s): TSH, T4TOTAL, T3FREE, THYROIDAB in the last 72 hours.  Invalid input(s): FREET3 Anemia work up No results for input(s): VITAMINB12, FOLATE, FERRITIN, TIBC, IRON, RETICCTPCT in the last 72 hours. Urinalysis    Component Value Date/Time   COLORURINE YELLOW (A) 01/13/2021 0153   APPEARANCEUR CLEAR (A) 01/13/2021 0153   LABSPEC 1.012 01/13/2021 0153   PHURINE 9.0 (H) 01/13/2021 0153   GLUCOSEU 150 (A) 01/13/2021 0153   HGBUR NEGATIVE 01/13/2021 0153   BILIRUBINUR NEGATIVE 01/13/2021 0153   KETONESUR NEGATIVE 01/13/2021 0153   PROTEINUR >=300 (A) 01/13/2021 0153   NITRITE NEGATIVE 01/13/2021 0153   LEUKOCYTESUR NEGATIVE 01/13/2021 0153   Sepsis Labs Invalid input(s): PROCALCITONIN,  WBC,  LACTICIDVEN Microbiology Recent Results (from the past 240 hour(s))  Resp Panel by RT-PCR (Flu A&B, Covid) Nasopharyngeal Swab     Status: None   Collection Time: 04/08/21  2:58 PM   Specimen: Nasopharyngeal Swab; Nasopharyngeal(NP) swabs in vial transport medium  Result Value Ref Range Status   SARS Coronavirus 2 by RT PCR NEGATIVE NEGATIVE Final    Comment: (NOTE) SARS-CoV-2 target nucleic acids are NOT DETECTED.  The SARS-CoV-2 RNA is generally detectable in upper respiratory specimens during the acute phase of infection. The lowest concentration of SARS-CoV-2 viral copies this assay can detect is 138 copies/mL. A negative result does not preclude SARS-Cov-2 infection and should not be used as  the sole basis for treatment or other patient management decisions. A negative result may occur with  improper specimen collection/handling, submission of specimen other than nasopharyngeal swab, presence of viral  mutation(s) within the areas targeted by this assay, and inadequate number of viral copies(<138 copies/mL). A negative result must be combined with clinical observations, patient history, and epidemiological information. The expected result is Negative.  Fact Sheet for Patients:  EntrepreneurPulse.com.au  Fact Sheet for Healthcare Providers:  IncredibleEmployment.be  This test is no t yet approved or cleared by the [place] FDA and  has been authorized for detection and/or diagnosis of SARS-CoV-2 by FDA under an Emergency Use Authorization (EUA). This EUA will remain  in effect (meaning this test can be used) for the duration of the COVID-19 declaration under Section 564(b)(1) of the Act, 21 U.S.C.section 360bbb-3(b)(1), unless the authorization is terminated  or revoked sooner.       Influenza A by PCR NEGATIVE NEGATIVE Final   Influenza B by PCR NEGATIVE NEGATIVE Final    Comment: (NOTE) The Xpert Xpress SARS-CoV-2/FLU/RSV plus assay is intended as an aid in the diagnosis of influenza from Nasopharyngeal swab specimens and should not be used as a sole basis for treatment. Nasal washings and aspirates are unacceptable for Xpert Xpress SARS-CoV-2/FLU/RSV testing.  Fact Sheet for Patients: EntrepreneurPulse.com.au  Fact Sheet for Healthcare Providers: IncredibleEmployment.be  This test is not yet approved or cleared by the [place] FDA and has been authorized for detection and/or diagnosis of SARS-CoV-2 by FDA under an Emergency Use Authorization (EUA). This EUA will remain in effect (meaning this test can be used) for the duration of the COVID-19 declaration under Section 564(b)(1) of the Act, 21 U.S.C. section 360bbb-3(b)(1), unless the authorization is terminated or revoked.  Performed at [facility], [address] [place], [place] [address]     Time coordinating discharge:   20 minutes.   SIGNED:  Ivor Costa, MD [facility] 04/09/2021, 8:54 AM   If 7PM-7AM, please contact night-coverage [email or web address]

## 2021-04-09 NOTE — Plan of Care (Signed)
  Problem: Nutrition: Goal: Adequate nutrition will be maintained Outcome: Progressing   

## 2021-04-09 NOTE — Progress Notes (Signed)
After giving patient one unit of RBC patient wanted to leave AMA due to his discomfort with itching. Patient has history of leaving AMA. Patient did received benadryl during the transfusion. MD Mansy notified about his AMA due to itching and orders was received. However, patient insisted to leave AMA even with a long conversion and education regarding his current condition. Patient signed AMA form IV was removed and this RN walked patient to the elevator to the first floor where he started to get SOB. Patient sat in the lobby hall chair to rest while this RN went a get a wheelchair. This RN tried again to convince the patient to return to the room for continuous care but patient still refused. When arrived to the ED lobby the ED staffs informed this RN that patient is not allowed to wait in the lobby till the 7 AM bus run. AC notified.  Finally, the patient agreed to return back to the room till after 7 am. MD notified of his his return.  Patient refused cardiac monitor and resting in the recliner. Will continue to provide the best care as he allows [place] to.

## 2021-04-09 NOTE — Progress Notes (Signed)
Patient signed out AMA on night shift but had to wait for bus to pick him up this am. He ordered breakfast and ate and then resigned AMA formed and left.

## 2021-04-11 LAB — TYPE AND SCREEN
ABO/RH(D): O NEG
Antibody Screen: POSITIVE
Donor AG Type: NEGATIVE
Unit division: 0
Unit division: 0
Unit division: 0

## 2021-04-11 LAB — BPAM RBC
Blood Product Expiration Date: 202207302359
Blood Product Expiration Date: 202208022359
Blood Product Expiration Date: 202208032359
ISSUE DATE / TIME: 202207182251
Unit Type and Rh: 9500
Unit Type and Rh: 9500
Unit Type and Rh: 9500

## 2021-04-26 ENCOUNTER — Emergency Department (HOSPITAL_COMMUNITY): Payer: Medicare Other

## 2021-04-26 ENCOUNTER — Emergency Department (HOSPITAL_COMMUNITY)
Admission: EM | Admit: 2021-04-26 | Discharge: 2021-04-26 | Disposition: A | Discharge status: Home / Self Care | Payer: Medicare Other | Source: Home / Self Care | Attending: Emergency Medicine | Admitting: Emergency Medicine

## 2021-04-26 ENCOUNTER — Other Ambulatory Visit: Payer: Self-pay

## 2021-04-26 DIAGNOSIS — Z992 Dependence on renal dialysis: Secondary | ICD-10-CM | POA: Insufficient documentation

## 2021-04-26 DIAGNOSIS — I5043 Acute on chronic combined systolic (congestive) and diastolic (congestive) heart failure: Secondary | ICD-10-CM | POA: Insufficient documentation

## 2021-04-26 DIAGNOSIS — F17210 Nicotine dependence, cigarettes, uncomplicated: Secondary | ICD-10-CM | POA: Insufficient documentation

## 2021-04-26 DIAGNOSIS — L259 Unspecified contact dermatitis, unspecified cause: Secondary | ICD-10-CM

## 2021-04-26 DIAGNOSIS — N185 Chronic kidney disease, stage 5: Secondary | ICD-10-CM | POA: Insufficient documentation

## 2021-04-26 DIAGNOSIS — K625 Hemorrhage of anus and rectum: Secondary | ICD-10-CM | POA: Insufficient documentation

## 2021-04-26 DIAGNOSIS — I132 Hypertensive heart and chronic kidney disease with heart failure and with stage 5 chronic kidney disease, or end stage renal disease: Secondary | ICD-10-CM | POA: Insufficient documentation

## 2021-04-26 DIAGNOSIS — Z7951 Long term (current) use of inhaled steroids: Secondary | ICD-10-CM | POA: Insufficient documentation

## 2021-04-26 DIAGNOSIS — R1084 Generalized abdominal pain: Secondary | ICD-10-CM | POA: Insufficient documentation

## 2021-04-26 DIAGNOSIS — J449 Chronic obstructive pulmonary disease, unspecified: Secondary | ICD-10-CM | POA: Insufficient documentation

## 2021-04-26 DIAGNOSIS — R0602 Shortness of breath: Secondary | ICD-10-CM | POA: Diagnosis not present

## 2021-04-26 LAB — CBC WITH DIFFERENTIAL/PLATELET
Abs Immature Granulocytes: 0.13 K/uL — ABNORMAL HIGH (ref 0.00–0.07)
Basophils Absolute: 0 K/uL (ref 0.0–0.1)
Basophils Relative: 1 %
Eosinophils Absolute: 0.1 K/uL (ref 0.0–0.5)
Eosinophils Relative: 2 %
HCT: 24.6 % — ABNORMAL LOW (ref 39.0–52.0)
Hemoglobin: 7.9 g/dL — ABNORMAL LOW (ref 13.0–17.0)
Immature Granulocytes: 2 %
Lymphocytes Relative: 6 %
Lymphs Abs: 0.4 K/uL — ABNORMAL LOW (ref 0.7–4.0)
MCH: 31.9 pg (ref 26.0–34.0)
MCHC: 32.1 g/dL (ref 30.0–36.0)
MCV: 99.2 fL (ref 80.0–100.0)
Monocytes Absolute: 0.5 K/uL (ref 0.1–1.0)
Monocytes Relative: 8 %
Neutro Abs: 5.3 K/uL (ref 1.7–7.7)
Neutrophils Relative %: 81 %
Platelets: 111 K/uL — ABNORMAL LOW (ref 150–400)
RBC: 2.48 MIL/uL — ABNORMAL LOW (ref 4.22–5.81)
RDW: 15.1 % (ref 11.5–15.5)
WBC: 6.5 K/uL (ref 4.0–10.5)
nRBC: 0.5 % — ABNORMAL HIGH (ref 0.0–0.2)

## 2021-04-26 LAB — COMPREHENSIVE METABOLIC PANEL WITH GFR
ALT: 14 U/L (ref 0–44)
AST: 27 U/L (ref 15–41)
Albumin: 2.9 g/dL — ABNORMAL LOW (ref 3.5–5.0)
Alkaline Phosphatase: 90 U/L (ref 38–126)
Anion gap: 19 — ABNORMAL HIGH (ref 5–15)
BUN: 106 mg/dL — ABNORMAL HIGH (ref 6–20)
CO2: 20 mmol/L — ABNORMAL LOW (ref 22–32)
Calcium: 8.4 mg/dL — ABNORMAL LOW (ref 8.9–10.3)
Chloride: 100 mmol/L (ref 98–111)
Creatinine, Ser: 18.91 mg/dL — ABNORMAL HIGH (ref 0.61–1.24)
GFR, Estimated: 3 mL/min — ABNORMAL LOW
Glucose, Bld: 95 mg/dL (ref 70–99)
Potassium: 4.8 mmol/L (ref 3.5–5.1)
Sodium: 139 mmol/L (ref 135–145)
Total Bilirubin: 1.1 mg/dL (ref 0.3–1.2)
Total Protein: 6.8 g/dL (ref 6.5–8.1)

## 2021-04-26 LAB — POC OCCULT BLOOD, ED: Fecal Occult Bld: POSITIVE — AB

## 2021-04-26 IMAGING — DX DG CHEST 1V PORT
1 series · 1 of 1 positions shown · non-contrast
Comparison: Chest x-ray [DATE].

CLINICAL DATA: 54-year-old male with history of shortness of
breath.

EXAM:
PORTABLE CHEST 1 VIEW

[chest]
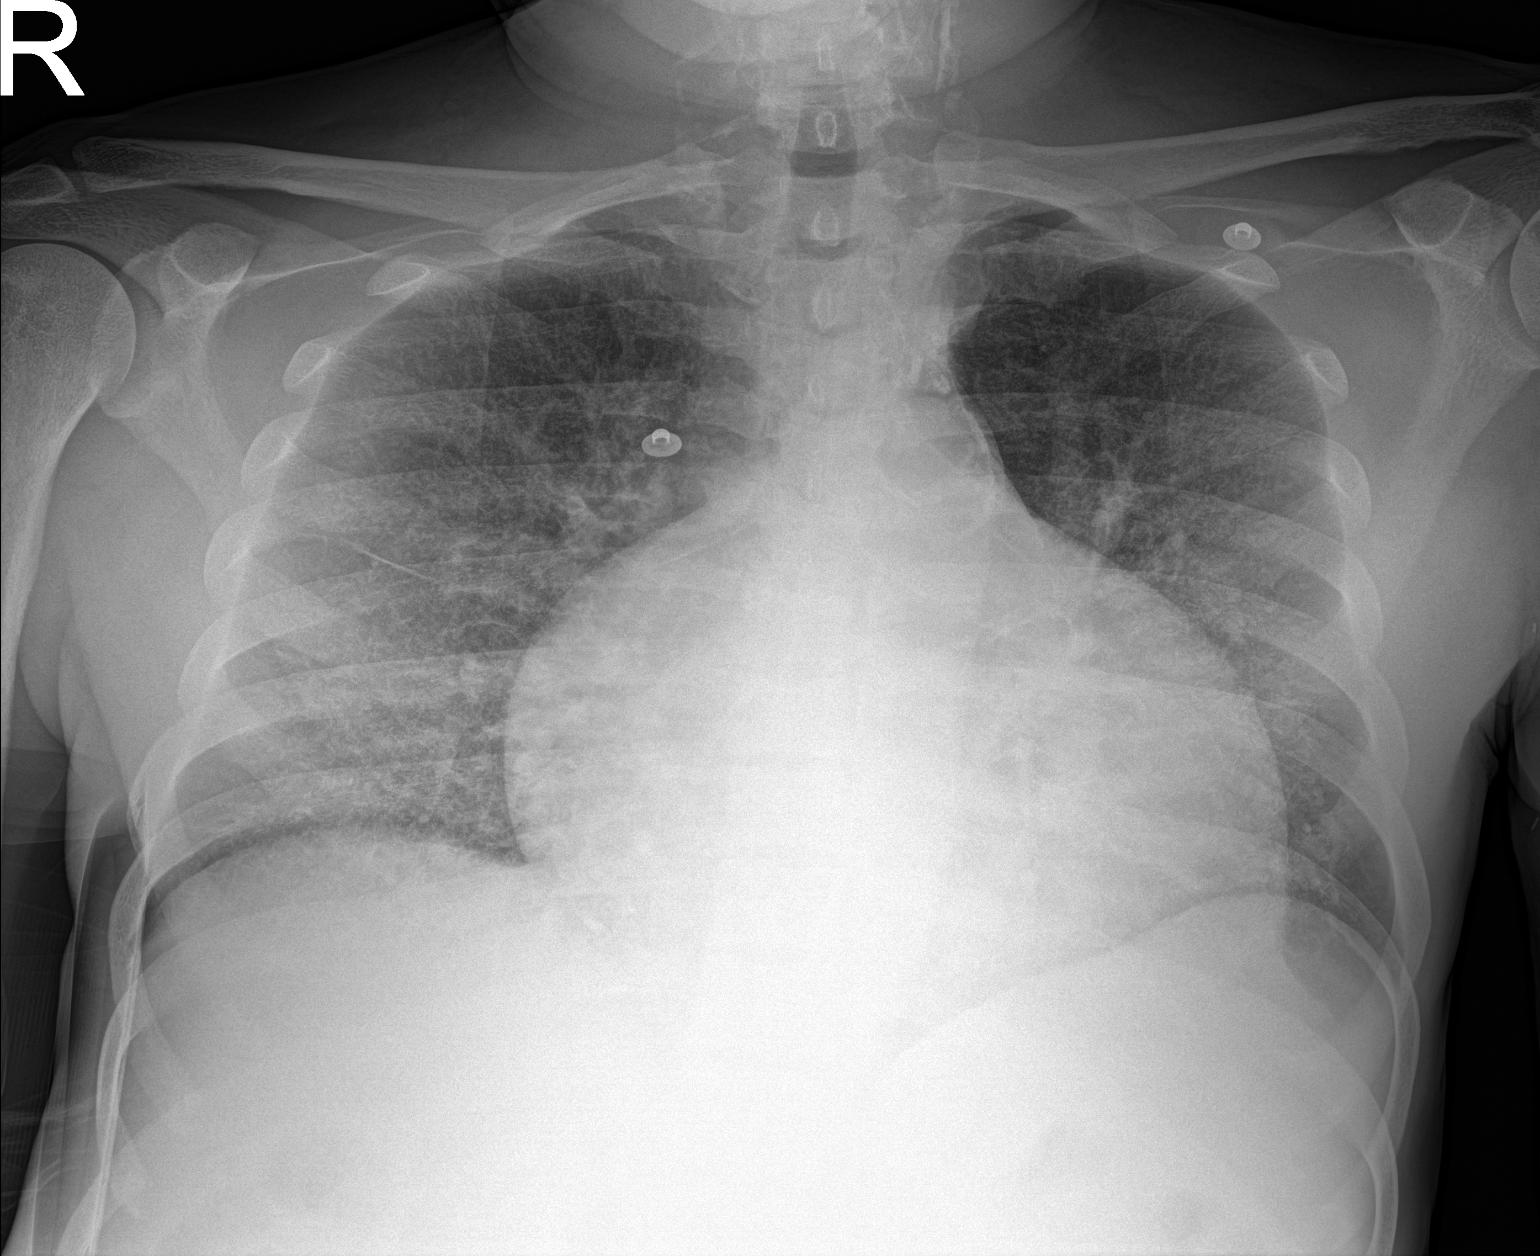

[1 of 1 positions shown; findings below may reference images not displayed]

FINDINGS: Lung volumes are normal. No confluent consolidative airspace
disease. No pleural effusions. There is cephalization of the
pulmonary vasculature and slight indistinctness of the interstitial
markings suggestive of mild pulmonary edema. Mild to moderate
cardiomegaly. Upper mediastinal contours are within normal limits.
Atherosclerotic calcifications in the thoracic aorta.
IMPRESSION: 1. The appearance the chest suggests mild congestive heart failure,
as above.

## 2021-04-26 IMAGING — CT CT ABD-PELV W/ CM
2 of 5 series · 16 of 46 positions shown, 18 images · IV contrast (omnipaque)
Comparison: [DATE]

CLINICAL DATA: Abdominal distension and pain.

EXAM:
CT ABDOMEN AND PELVIS WITH CONTRAST
TECHNIQUE: Multidetector CT imaging of the abdomen and pelvis was performed
using the standard protocol following bolus administration of
intravenous contrast.
CONTRAST:  50mL OMNIPAQUE IOHEXOL 300 MG/ML  SOLN

[Series 3: abdomen 5.0 · axial · 0.76mm/px · z∈[-451,-61]mm · 13 of 91 slices shown, 15 images]
[im 7/91  soft-tissue]
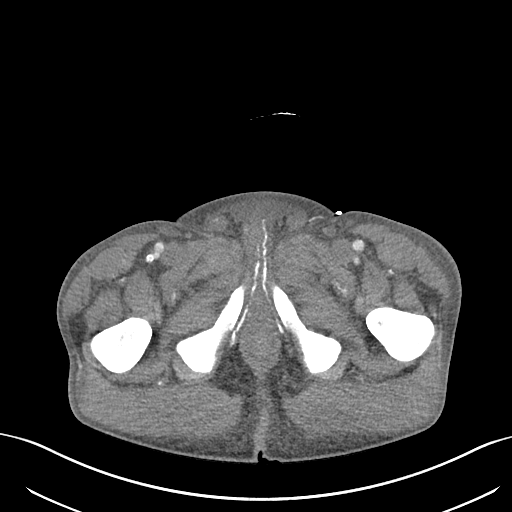
[im 7/91  bone]
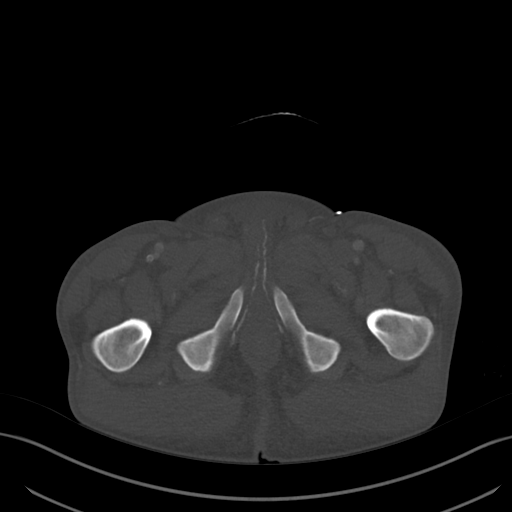
[im 13/91  soft-tissue]
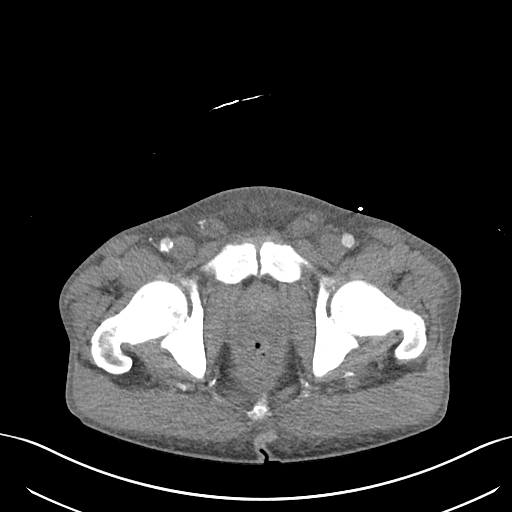
[im 19/91  soft-tissue]
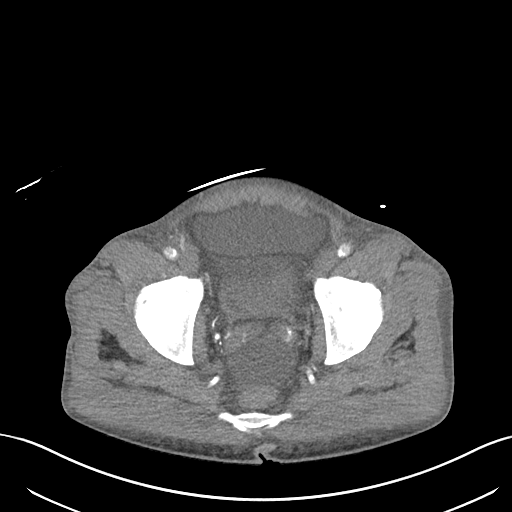
[im 25/91  soft-tissue]
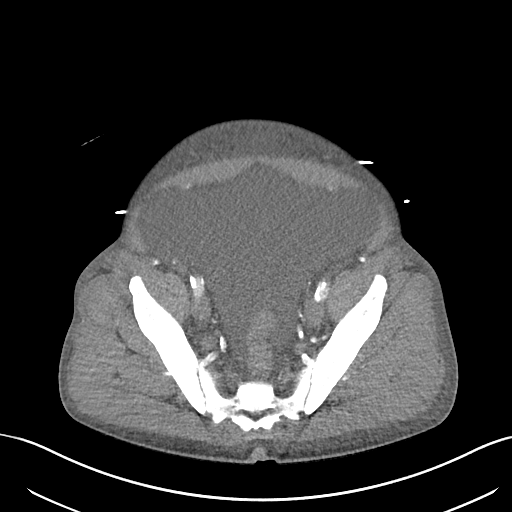
[im 31/91  soft-tissue]
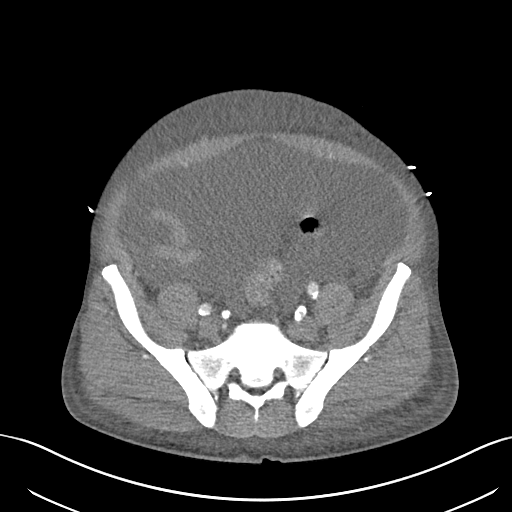
[im 37/91  soft-tissue]
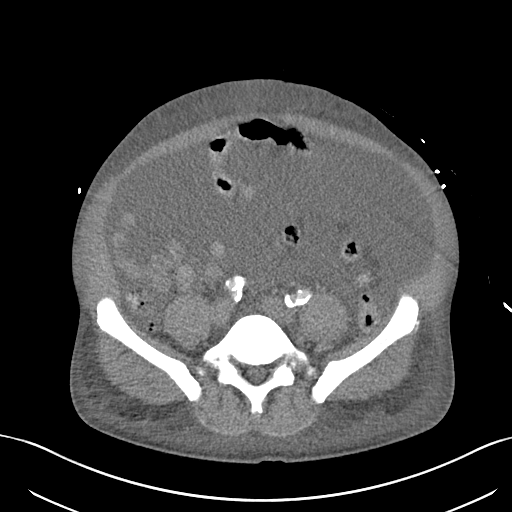
[im 49/91  soft-tissue]
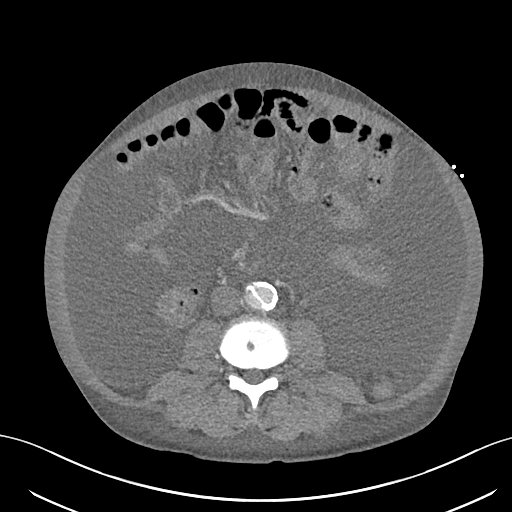
[im 55/91  soft-tissue]
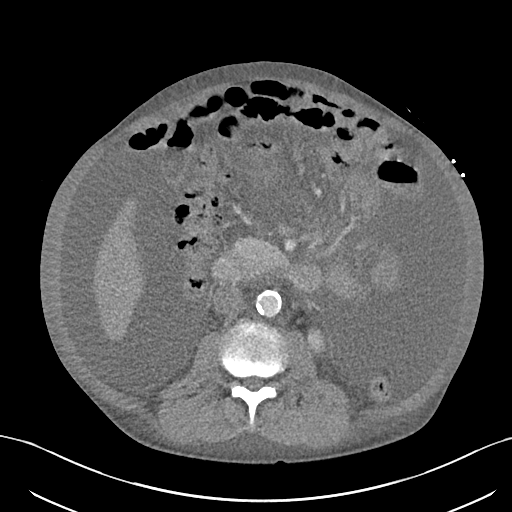
[im 61/91  soft-tissue]
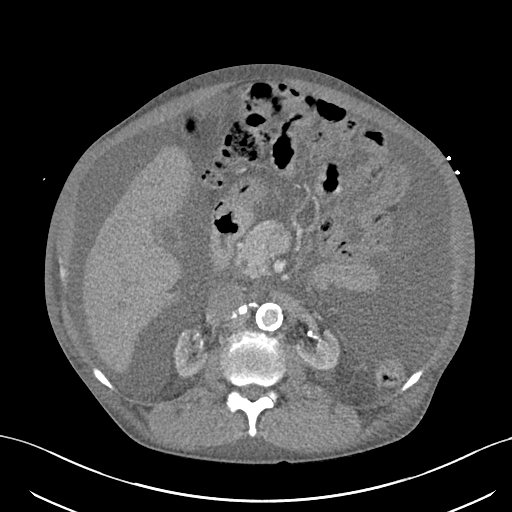
[im 61/91  bone]
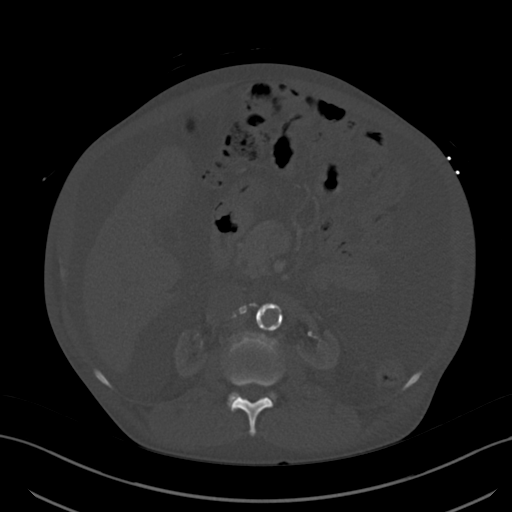
[im 67/91  soft-tissue]
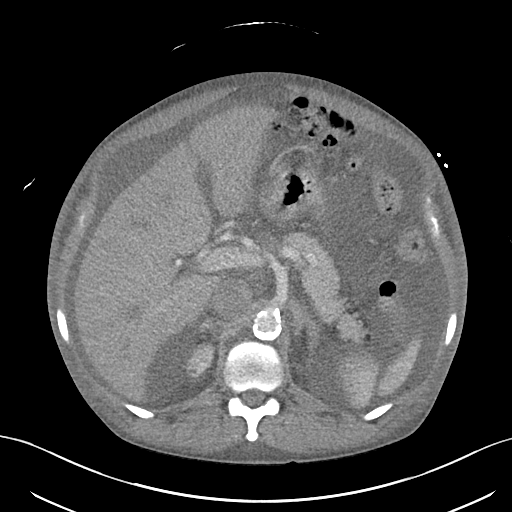
[im 73/91  soft-tissue]
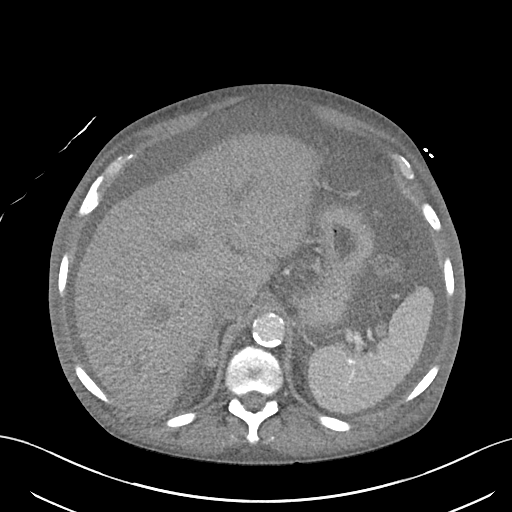
[im 79/91  soft-tissue]
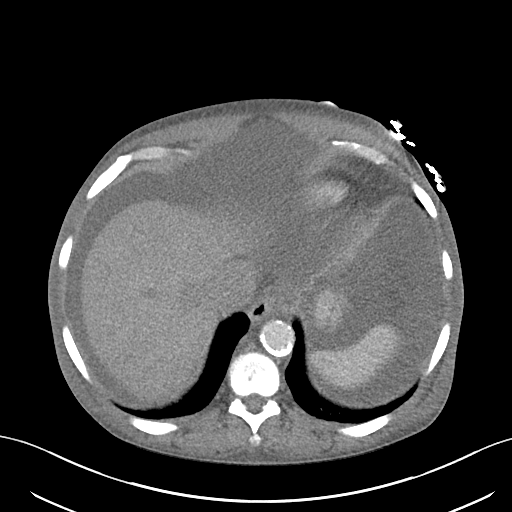
[im 85/91  soft-tissue]
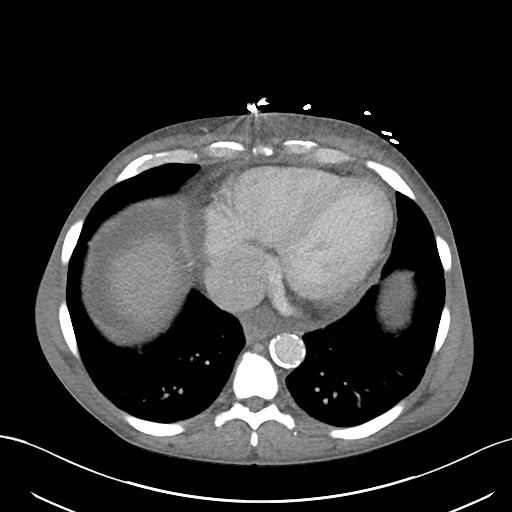

[Series 6: abdomen 3.0 mpr cor · coronal · 0.70mm/px · 3 of 103 slices shown]
[im 35/103  soft-tissue]
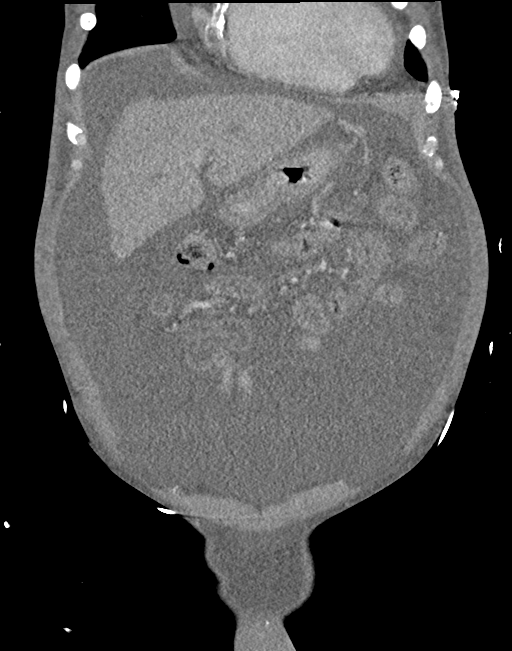
[im 46/103  soft-tissue]
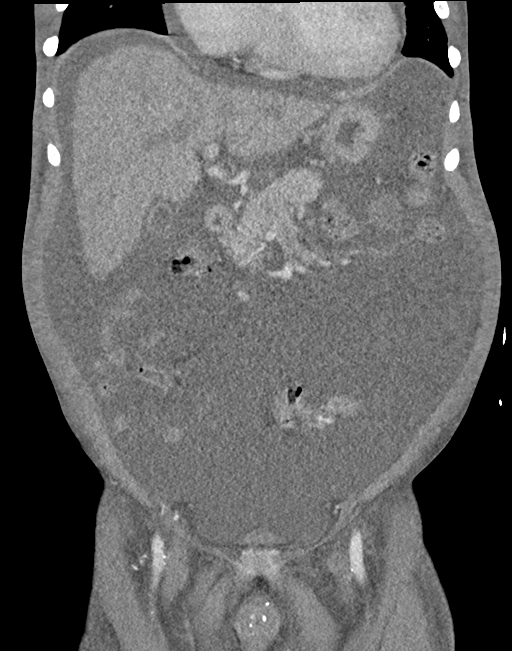
[im 57/103  soft-tissue]
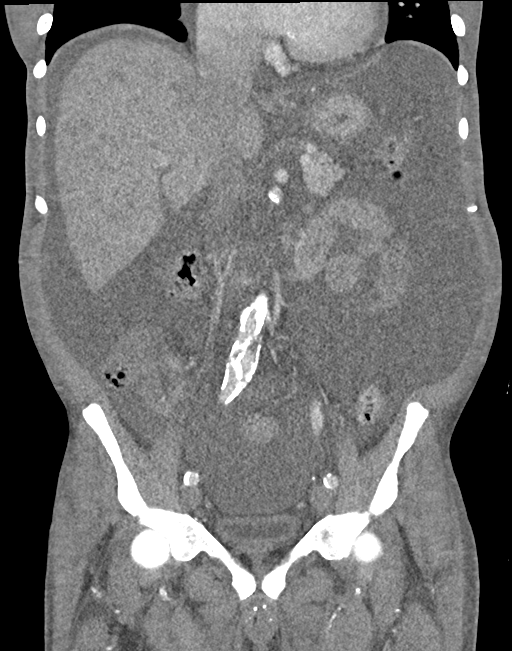

[16 of 46 positions shown; findings below may reference images not displayed]

FINDINGS: Lower chest: Small pericardial effusion, new. Mild opacification at
the bases, suspect early edema. Extensive arterial calcification.
Cardiomegaly.

Hepatobiliary: Distended hepatic veins. No focal abnormality. No
evidence of biliary obstruction or stone.

Pancreas: Unremarkable.

Spleen: Unremarkable.

Adrenals/Urinary Tract: Negative adrenals. End-stage renal disease
with symmetric advanced renal atrophy. Collapsed urinary bladder.

Stomach/Bowel:  No obstruction. No visible bowel inflammation

Vascular/Lymphatic: Chronic dissection involving the right common
iliac artery. Confluent atheromatous plaque on the aorta and
multiple branch vessels. No mass or adenopathy.

Reproductive:No acute finding

Other: Large volume ascites, increased.  Increased anasarca.

Musculoskeletal: Generalized sclerosis compatible with renal
osteodystrophy.
IMPRESSION: Worsening volume overload with large volume ascites, prominent
anasarca, and new small pericardial effusion.

## 2021-04-26 MED ORDER — HYDROXYZINE HCL 25 MG PO TABS
25.0000 mg | ORAL_TABLET | Freq: Once | ORAL | Status: AC
  Administered 2021-04-26: 25 mg via ORAL
  Filled 2021-04-26: qty 1

## 2021-04-26 MED ORDER — IOHEXOL 300 MG/ML  SOLN
50.0000 mL | Freq: Once | INTRAMUSCULAR | Status: AC | PRN
  Administered 2021-04-26: 50 mL via INTRAVENOUS

## 2021-04-26 NOTE — Discharge Planning (Signed)
RNCM consulted regarding transportation needs for pt.  RNCM provided [facility] transportation as pt has no transportation from hospital.  RNCM presented and explained [place] and Release of Liability Form.  Pt signed waiver, therefore agreeing to written terms.

## 2021-04-26 NOTE — ED Notes (Signed)
ED Provider at bedside. 

## 2021-04-26 NOTE — Discharge Instructions (Addendum)
We are getting you transportation to dialysis. Please complete your entire session of dialysis.

## 2021-04-26 NOTE — ED Triage Notes (Addendum)
Pt brought to ED by [facility] with several complaints, including abdominal distention/pain, last known BM 3 days ago, leaking clear fluid out of anus, scab on wound to top left gluteus next to sacrum that was removed by pt causing bleeding to area, and rash to outer portions of bilateral lower extremities with itching. Is hemodialysis pt M/W/F. Last dialysis Wednesday, in which only half of session was completed. Per EMS, pt declined to wear clothing to ED because "they make him itch".

## 2021-04-26 NOTE — ED Notes (Signed)
Pt ambulated with a steady gait to restroom 

## 2021-04-26 NOTE — ED Provider Notes (Signed)
[facility] EMERGENCY DEPARTMENT Provider Note   CSN: [id number] Arrival date & time: 04/26/21  0221     History Chief Complaint  Patient presents with   Abdominal Pain   Rash    Jordan [name] is a 55 y.o. male with a history of ESRD on HD (M/W/F), emphysema, alcohol use disorder who presents to the emergency department by EMS with a chief complaint of rectal bleeding.  The patient reports rectal bleeding intermittently for the last couple of days.  He reports associated abdominal pain and distention.  Although abdominal pain is diffuse, it is more prevalent on the right side.  He is unable to characterize the pain.  He states that he is feeling more constipated as he has not had a bowel movement in at least 3 to 4 days.  In addition to having a small amount of blood leaking from the rectum, he also has noticed some clear fluid leaking out of the anus.  Bleeding has not been worsening.  He has been feeling more short of breath and has been having intermittent chest pain, but none at this time.  He had a shortened dialysis session yesterday where he only completed half of his session.  He reports that his last 2 sessions on Monday and Friday were also cut short by an hour due to itching.  No fever, chills, vomiting, diarrhea, cough, leg swelling, dizziness, lightheadedness.  No treatment prior to arrival.  The history is provided by the patient and medical records. No language interpreter was used.      Past Medical History:  Diagnosis Date   Anemia    Aortic atherosclerosis ([place]) 11/12/2019   CKD (chronic kidney disease)    Stage 5  Dialysis - M/W/F in [place], [place]   Dyspnea    tx with inhaler when sick   ED (erectile dysfunction)    Emphysema of lung ([place]) 11/12/2019   ETOH abuse    History of blood transfusion    Hypertension    Wears dentures     Patient Active Problem List   Diagnosis Date Noted   Thrombocytopenia ([place]) 04/08/2021   COPD (chronic  obstructive pulmonary disease) ([place]) 04/08/2021   CHF (congestive heart failure) ([place]) 03/25/2021   PAF (paroxysmal atrial fibrillation) ([place]) 03/20/2021   Chronic diastolic CHF (congestive heart failure) ([place]) 03/20/2021   Alcohol abuse 03/20/2021   Vision loss of left eye 03/20/2021   SOB (shortness of breath) 03/20/2021   Shortness of breath 03/10/2021   Abscess of buttock 03/02/2021   Fluid overload 03/02/2021   Multifocal pneumonia 01/13/2021   Current mild episode of major depressive disorder without prior episode ([place]) 04/09/2020   Atrial fibrillation with RVR ([place]) 04/04/2020   Non-compliance with renal dialysis ([place]) 03/21/2020   Acute CHF (congestive heart failure) ([place]) 03/12/2020   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 ([place]) 02/03/2020   Acute on chronic combined systolic and diastolic CHF (congestive heart failure) ([place]) 02/03/2020   Acute respiratory failure with hypoxia ([place]) 11/12/2019   Aortic atherosclerosis ([place]) 11/12/2019   Emphysema of lung ([place]) 11/12/2019   Acute on chronic congestive heart failure ([place]) 10/22/2019   Volume overload 10/05/2019   Elevated troponin 05/17/2018   Anemia in ESRD (end-stage renal disease) ([place]) 05/17/2018   ESRD on dialysis ([place]) 05/26/2017   Essential hypertension 05/26/2017   Moderate protein-calorie malnutrition ([place]) 08/13/2016   Secondary hyperparathyroidism of renal origin ([place]) 08/13/2016   Tobacco abuse 08/04/2016    Past  Surgical History:  Procedure Laterality Date   BASCILIC VEIN TRANSPOSITION Left 08/07/2016   Procedure: LEFT BASILIC VEIN TRANSPOSITION;  Surgeon: Angelia Mould, MD;  Location: [place] [facility];  Service: Vascular;  Laterality: Left;   CLOSED REDUCTION NASAL FRACTURE N/A 08/11/2019   Procedure: CLOSED REDUCTION NASAL FRACTURE WITH STABILIZATION;  Surgeon: Irene Limbo, MD;  Location: [facility];  Service: Plastics;  Laterality: N/A;   COLONOSCOPY N/A 08/12/2016   Procedure: COLONOSCOPY;   Surgeon: Otis Brace, MD;  Location: [facility];  Service: Gastroenterology;  Laterality: N/A;   ESOPHAGOGASTRODUODENOSCOPY N/A 08/12/2016   Procedure: ESOPHAGOGASTRODUODENOSCOPY (EGD);  Surgeon: Otis Brace, MD;  Location: [facility];  Service: Gastroenterology;  Laterality: N/A;   INSERTION OF DIALYSIS CATHETER N/A 08/07/2016   Procedure: INSERTION OF TUNNELED DIALYSIS CATHETER;  Surgeon: Angelia Mould, MD;  Location: [facility];  Service: Vascular;  Laterality: N/A;   LIGATION OF ARTERIOVENOUS  FISTULA Left 09/12/2016   Procedure: BANDING OF LEFT  ARTERIOVENOUS  FISTULA;  Surgeon: Angelia Mould, MD;  Location: [facility];  Service: Vascular;  Laterality: Left;   LOWER EXTREMITY INTERVENTION Right 12/02/2018   Procedure: LOWER EXTREMITY INTERVENTION;  Surgeon: Algernon Huxley, MD;  Location: [facility];  Service: Cardiovascular;  Laterality: Right;       Family History  Problem Relation Age of Onset   Diabetes Mother    Kidney failure Mother    Healthy Father    Kidney failure Brother    Healthy Sister    Kidney disease Daughter    Post-traumatic stress disorder Neg Hx    Bladder Cancer Neg Hx    Kidney cancer Neg Hx     Social History   Tobacco Use   Smoking status: Every Day    Packs/day: 0.50    Years: 40.00    Pack years: 20.00    Types: Cigarettes   Smokeless tobacco: Never  Vaping Use   Vaping Use: Never used  Substance Use Topics   Alcohol use: Yes    Alcohol/week: 21.0 standard drinks    Types: 21 Cans of beer per week    Comment: last drink 6 months ago   Drug use: No    Home Medications Prior to Admission medications   Medication Sig Start Date End Date Taking? Authorizing Provider  AURYXIA 1 GM 210 MG(Fe) tablet Take 420 mg by mouth every Monday, Wednesday, and Friday with hemodialysis. 11/13/20   [provider]  calcium acetate (PHOSLO) 667 MG capsule Take 2 capsules (1,334 mg total) by mouth 3 (three) times daily with  meals. 03/11/21   Lorella Nimrod, MD  cinacalcet (SENSIPAR) 60 MG tablet SMARTSIG:2 Tablet(s) By Mouth Every Evening 03/18/21   [provider]  epoetin alfa (EPOGEN) 4000 UNIT/ML injection Inject 1 mL (4,000 Units total) into the vein every Monday, Wednesday, and Friday with hemodialysis. 06/22/20   Enzo Bi, MD  fluticasone (FLONASE) 50 MCG/ACT nasal spray Place 1 spray into both nostrils daily for 15 days. 03/27/21 04/11/21  Arrien, Jimmy Picket, MD  hydrOXYzine (VISTARIL) 50 MG capsule Take 1 capsule (50 mg total) by mouth 3 (three) times daily as needed. 02/26/21   Cuthriell, Charline Bills, PA-C  Lidocaine 3 % CREA Apply 1 application topically 2 (two) times daily as needed (pain). 02/26/21   Cuthriell, Charline Bills, PA-C  thiamine 100 MG tablet Take 1 tablet (100 mg total) by mouth daily. 03/11/21   Lorella Nimrod, MD  VELPHORO 500 MG chewable tablet Chew 500 mg by mouth  3 (three) times daily. 03/15/21   [provider]  witch hazel-glycerin (TUCKS) pad Apply topically as needed for itching, hemorrhoids or irritation. Patient not taking: No sig reported 03/26/21   Arrien, Jimmy Picket, MD    Allergies    Lisinopril  Review of Systems   Review of Systems  Constitutional:  Negative for appetite change, chills and fever.  HENT:  Negative for congestion and sore throat.   Eyes:  Negative for visual disturbance.  Respiratory:  Positive for shortness of breath. Negative for wheezing.   Cardiovascular:  Positive for chest pain. Negative for palpitations and leg swelling.  Gastrointestinal:  Positive for abdominal pain and anal bleeding. Negative for blood in stool, diarrhea, nausea and vomiting.  Genitourinary:  Negative for dysuria.  Musculoskeletal:  Negative for back pain, myalgias and neck pain.  Skin:  Negative for rash.       Pruritus   Allergic/Immunologic: Negative for immunocompromised state.  Neurological:  Negative for dizziness, seizures, syncope, weakness, numbness and  headaches.  Psychiatric/Behavioral:  Negative for confusion.    Physical Exam Updated Vital Signs BP 138/90   Pulse 96   Temp 98.4 F (36.9 C) (Temporal)   Resp (!) 28   SpO2 100%   Physical Exam Vitals and nursing note reviewed.  Constitutional:      Appearance: He is well-developed.  HENT:     Head: Normocephalic.  Eyes:     Conjunctiva/sclera: Conjunctivae normal.  Cardiovascular:     Rate and Rhythm: Normal rate and regular rhythm.     Heart sounds: No murmur heard. Pulmonary:     Effort: Pulmonary effort is normal.     Comments: Crackles in the bilateral bases.  No tachypnea. Abdominal:     General: There is distension.     Palpations: Abdomen is soft.     Tenderness: There is abdominal tenderness.     Comments: Mild generalized tenderness to palpation throughout the abdomen.  No focal tenderness.  No rebound or guarding.  Abdomen is mildly distended, but soft.  Normoactive bowel sounds.  Genitourinary:    Comments: Chaperoned exam.  Yellow stool noted on DRE.  Normal rectal tone.  No external hemorrhoids. Musculoskeletal:        General: No tenderness.     Cervical back: Neck supple.     Right lower leg: No edema.     Left lower leg: No edema.  Skin:    General: Skin is warm and dry.     Comments: Multiple scabs noted to the patient's trunk and extremities.  There are several scabbed lesions near the gluteal cleft that are mildly oozing with blood.  Neurological:     Mental Status: He is alert.  Psychiatric:        Behavior: Behavior normal.    ED Results / Procedures / Treatments   Labs (all labs ordered are listed, but only abnormal results are displayed) Labs Reviewed  CBC WITH DIFFERENTIAL/PLATELET  COMPREHENSIVE METABOLIC PANEL  POC OCCULT BLOOD, ED    EKG None  Radiology No results found.  Procedures Procedures   Medications Ordered in ED Medications  hydrOXYzine (ATARAX/VISTARIL) tablet 25 mg (has no administration in time range)     ED Course  I have reviewed the triage vital signs and the nursing notes.  Pertinent labs & imaging results that were available during my care of the patient were reviewed by me and considered in my medical decision making (see chart for details).    MDM Rules/Calculators/A&P  55 year old male history of ESRD on HD (M/W/F), emphysema, alcohol use disorder who presents to the emergency department with multiple complaints, but his chief complaint is rectal bleeding.  He also adds constipation, abdominal.  He has had reduced dialysis sessions over the last week and adds that he has been feeling short of breath and has been having intermittent chest pain.  Vital signs are stable.  Labs and imaging of been reviewed and independently interpreted by me.  Creatinine is 18.9.  However, potassium is normal.  He has bibasilar crackles on exam and chest x-ray demonstrates mild pulmonary edema.  Who has no hypoxia or tachypnea.  I suspect that this is secondary to reduced dialysis sessions.  No indication for emergent dialysis.  Regards to rectal bleeding, Hemoccult is positive, but there is no gross blood on DRE.  Hemoglobin is improved from previous at 7.9 and appears to be close to his baseline.  No hypertension.  Will provide the patient with referral to GI.   CT abdomen pelvis is pending.  Patient care transferred to Dr. Claud Kelp, ED resident, at the end of my shift to follow-up on CT abdomen pelvis.  If CT is reassuring, patient will need to be discharged to be dialyzed today.  Patient presentation, ED course, and plan of care discussed with review of all pertinent labs and imaging. Please see his/her note for further details regarding further ED course and disposition.   Final Clinical Impression(s) / ED Diagnoses Final diagnoses:  None    Rx / DC Orders ED Discharge Orders     None        Joanne Gavel, PA-C 04/26/21 0753    Quintella Reichert,  MD 04/30/21 1425

## 2021-04-26 NOTE — ED Provider Notes (Signed)
Rectal bleeding Has wounds near gluteal cleft HGB stable Abd pain, constipation '[ ]'$  CT scan  Physical Exam  BP (!) 141/91   Pulse 88   Temp 98.4 F (36.9 C) (Temporal)   Resp 16   SpO2 98%   ED Course/Procedures     Procedures  MDM  CT scan shows some evidence of volume overload, but otherwise no acute abnormalities.  Previous provider performed a rectal exam, there is no gross blood in the rectal vault but Hemoccult was positive.  She placed a referral to gastroenterology, but given the patient's blood count is stable, he is hemodynamically stable, no concern for a clinically significant acute GI bleed at this time per previous team.  Today is the patient scheduled dialysis today.  Overall, from an emergency perspective he is cleared for discharge but does need dialysis today.  Patient states that he has no transportation to go to his dialysis center.  I have contacted social work, they have arranged a ride for the patient to go to his kidney center in [place] for his scheduled dialysis session so that he can have volume removed.  Patient is comfortable with that plan.  Strict return precautions discussed.       Claud Kelp, MD 04/26/21 1548    Davonna Belling, MD 04/27/21 1455

## 2021-04-29 ENCOUNTER — Inpatient Hospital Stay (HOSPITAL_COMMUNITY)
Admission: EM | Admit: 2021-04-29 | Discharge: 2021-05-04 | DRG: 291 | Disposition: A | Discharge status: Home / Self Care | Payer: Medicare Other | Attending: Internal Medicine | Admitting: Internal Medicine

## 2021-04-29 ENCOUNTER — Other Ambulatory Visit: Payer: Self-pay

## 2021-04-29 ENCOUNTER — Emergency Department (HOSPITAL_COMMUNITY): Payer: Medicare Other

## 2021-04-29 DIAGNOSIS — E8770 Fluid overload, unspecified: Secondary | ICD-10-CM | POA: Diagnosis not present

## 2021-04-29 DIAGNOSIS — F1010 Alcohol abuse, uncomplicated: Secondary | ICD-10-CM | POA: Diagnosis present

## 2021-04-29 DIAGNOSIS — R0602 Shortness of breath: Secondary | ICD-10-CM | POA: Diagnosis present

## 2021-04-29 DIAGNOSIS — J810 Acute pulmonary edema: Secondary | ICD-10-CM | POA: Diagnosis not present

## 2021-04-29 DIAGNOSIS — I132 Hypertensive heart and chronic kidney disease with heart failure and with stage 5 chronic kidney disease, or end stage renal disease: Secondary | ICD-10-CM | POA: Diagnosis present

## 2021-04-29 DIAGNOSIS — N186 End stage renal disease: Secondary | ICD-10-CM

## 2021-04-29 DIAGNOSIS — L299 Pruritus, unspecified: Secondary | ICD-10-CM | POA: Diagnosis not present

## 2021-04-29 DIAGNOSIS — D72828 Other elevated white blood cell count: Secondary | ICD-10-CM | POA: Diagnosis present

## 2021-04-29 DIAGNOSIS — F1011 Alcohol abuse, in remission: Secondary | ICD-10-CM | POA: Diagnosis present

## 2021-04-29 DIAGNOSIS — I5043 Acute on chronic combined systolic (congestive) and diastolic (congestive) heart failure: Secondary | ICD-10-CM | POA: Diagnosis present

## 2021-04-29 DIAGNOSIS — Z992 Dependence on renal dialysis: Secondary | ICD-10-CM

## 2021-04-29 DIAGNOSIS — Z720 Tobacco use: Secondary | ICD-10-CM | POA: Diagnosis not present

## 2021-04-29 DIAGNOSIS — K625 Hemorrhage of anus and rectum: Secondary | ICD-10-CM | POA: Diagnosis present

## 2021-04-29 DIAGNOSIS — J449 Chronic obstructive pulmonary disease, unspecified: Secondary | ICD-10-CM | POA: Diagnosis not present

## 2021-04-29 DIAGNOSIS — Z833 Family history of diabetes mellitus: Secondary | ICD-10-CM | POA: Diagnosis not present

## 2021-04-29 DIAGNOSIS — J811 Chronic pulmonary edema: Secondary | ICD-10-CM | POA: Diagnosis present

## 2021-04-29 DIAGNOSIS — J9601 Acute respiratory failure with hypoxia: Secondary | ICD-10-CM | POA: Diagnosis present

## 2021-04-29 DIAGNOSIS — K5900 Constipation, unspecified: Secondary | ICD-10-CM | POA: Diagnosis present

## 2021-04-29 DIAGNOSIS — J9611 Chronic respiratory failure with hypoxia: Secondary | ICD-10-CM | POA: Diagnosis present

## 2021-04-29 DIAGNOSIS — D696 Thrombocytopenia, unspecified: Secondary | ICD-10-CM

## 2021-04-29 DIAGNOSIS — Z09 Encounter for follow-up examination after completed treatment for conditions other than malignant neoplasm: Secondary | ICD-10-CM

## 2021-04-29 DIAGNOSIS — I10 Essential (primary) hypertension: Secondary | ICD-10-CM | POA: Diagnosis not present

## 2021-04-29 DIAGNOSIS — D631 Anemia in chronic kidney disease: Secondary | ICD-10-CM | POA: Diagnosis present

## 2021-04-29 DIAGNOSIS — J9621 Acute and chronic respiratory failure with hypoxia: Secondary | ICD-10-CM | POA: Diagnosis present

## 2021-04-29 DIAGNOSIS — F17210 Nicotine dependence, cigarettes, uncomplicated: Secondary | ICD-10-CM | POA: Diagnosis present

## 2021-04-29 DIAGNOSIS — Z9981 Dependence on supplemental oxygen: Secondary | ICD-10-CM

## 2021-04-29 DIAGNOSIS — R609 Edema, unspecified: Secondary | ICD-10-CM | POA: Diagnosis not present

## 2021-04-29 DIAGNOSIS — D6959 Other secondary thrombocytopenia: Secondary | ICD-10-CM | POA: Diagnosis present

## 2021-04-29 DIAGNOSIS — L298 Other pruritus: Secondary | ICD-10-CM | POA: Diagnosis present

## 2021-04-29 DIAGNOSIS — Z9115 Patient's noncompliance with renal dialysis: Secondary | ICD-10-CM

## 2021-04-29 DIAGNOSIS — I480 Paroxysmal atrial fibrillation: Secondary | ICD-10-CM | POA: Diagnosis present

## 2021-04-29 DIAGNOSIS — J439 Emphysema, unspecified: Secondary | ICD-10-CM | POA: Diagnosis present

## 2021-04-29 DIAGNOSIS — U071 COVID-19: Secondary | ICD-10-CM | POA: Diagnosis present

## 2021-04-29 DIAGNOSIS — R531 Weakness: Secondary | ICD-10-CM

## 2021-04-29 LAB — COMPREHENSIVE METABOLIC PANEL
ALT: 14 U/L (ref 0–44)
AST: 21 U/L (ref 15–41)
Albumin: 2.8 g/dL — ABNORMAL LOW (ref 3.5–5.0)
Alkaline Phosphatase: 98 U/L (ref 38–126)
Anion gap: 18 — ABNORMAL HIGH (ref 5–15)
BUN: 89 mg/dL — ABNORMAL HIGH (ref 6–20)
CO2: 24 mmol/L (ref 22–32)
Calcium: 8.9 mg/dL (ref 8.9–10.3)
Chloride: 98 mmol/L (ref 98–111)
Creatinine, Ser: 16.64 mg/dL — ABNORMAL HIGH (ref 0.61–1.24)
GFR, Estimated: 3 mL/min — ABNORMAL LOW (ref >60)
Glucose, Bld: 84 mg/dL (ref 70–99)
Potassium: 4.1 mmol/L (ref 3.5–5.1)
Sodium: 140 mmol/L (ref 135–145)
Total Bilirubin: 1.2 mg/dL (ref 0.3–1.2)
Total Protein: 7.2 g/dL (ref 6.5–8.1)

## 2021-04-29 LAB — C-REACTIVE PROTEIN: CRP: 17 mg/dL — ABNORMAL HIGH (ref <1.0)

## 2021-04-29 LAB — CBC WITH DIFFERENTIAL/PLATELET
Abs Immature Granulocytes: 0.25 10*3/uL — ABNORMAL HIGH (ref 0.00–0.07)
Basophils Absolute: 0 10*3/uL (ref 0.0–0.1)
Basophils Relative: 0 %
Eosinophils Absolute: 0.2 10*3/uL (ref 0.0–0.5)
Eosinophils Relative: 1 %
HCT: 23.7 % — ABNORMAL LOW (ref 39.0–52.0)
Hemoglobin: 7.5 g/dL — ABNORMAL LOW (ref 13.0–17.0)
Immature Granulocytes: 2 %
Lymphocytes Relative: 3 %
Lymphs Abs: 0.6 10*3/uL — ABNORMAL LOW (ref 0.7–4.0)
MCH: 31.4 pg (ref 26.0–34.0)
MCHC: 31.6 g/dL (ref 30.0–36.0)
MCV: 99.2 fL (ref 80.0–100.0)
Monocytes Absolute: 0.7 10*3/uL (ref 0.1–1.0)
Monocytes Relative: 4 %
Neutro Abs: 15.2 10*3/uL — ABNORMAL HIGH (ref 1.7–7.7)
Neutrophils Relative %: 90 %
Platelets: 93 10*3/uL — ABNORMAL LOW (ref 150–400)
RBC: 2.39 MIL/uL — ABNORMAL LOW (ref 4.22–5.81)
RDW: 15.4 % (ref 11.5–15.5)
WBC: 16.8 10*3/uL — ABNORMAL HIGH (ref 4.0–10.5)
nRBC: 0 % (ref 0.0–0.2)

## 2021-04-29 LAB — TRIGLYCERIDES: Triglycerides: 105 mg/dL (ref <150)

## 2021-04-29 LAB — RESP PANEL BY RT-PCR (FLU A&B, COVID) ARPGX2
Influenza A by PCR: NEGATIVE
Influenza B by PCR: NEGATIVE
SARS Coronavirus 2 by RT PCR: POSITIVE — AB

## 2021-04-29 LAB — FERRITIN: Ferritin: 939 ng/mL — ABNORMAL HIGH (ref 24–336)

## 2021-04-29 LAB — LACTATE DEHYDROGENASE: LDH: 310 U/L — ABNORMAL HIGH (ref 98–192)

## 2021-04-29 LAB — D-DIMER, QUANTITATIVE: D-Dimer, Quant: 3.15 ug/mL-FEU — ABNORMAL HIGH (ref 0.00–0.50)

## 2021-04-29 LAB — FIBRINOGEN: Fibrinogen: 444 mg/dL (ref 210–475)

## 2021-04-29 IMAGING — CR DG CHEST 2V
2 series · 2 of 2 positions shown · non-contrast
Comparison: Chest x-ray dated [DATE]

CLINICAL DATA: Shortness of breath

EXAM:
CHEST - 2 VIEW

[chest pa]
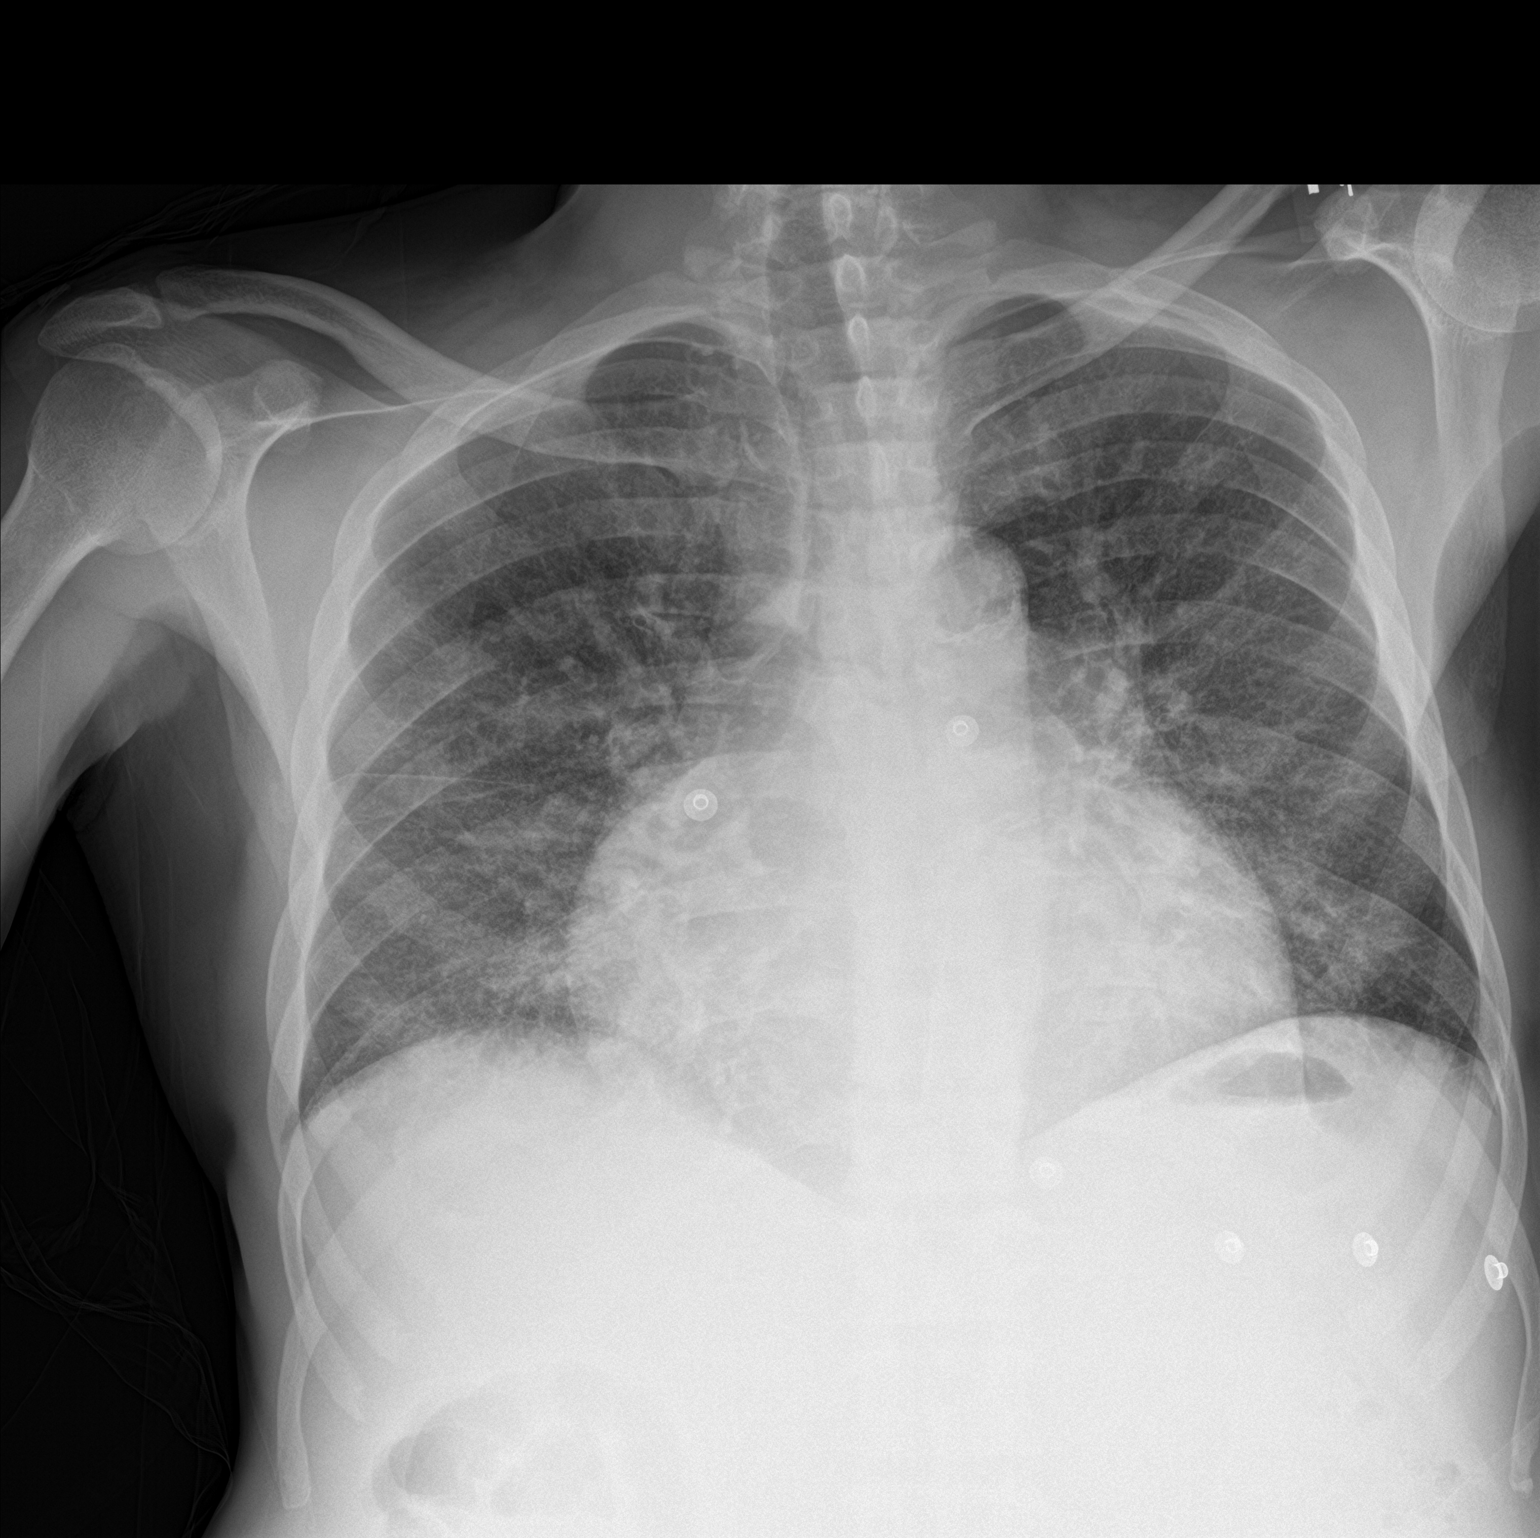

[chest lat]
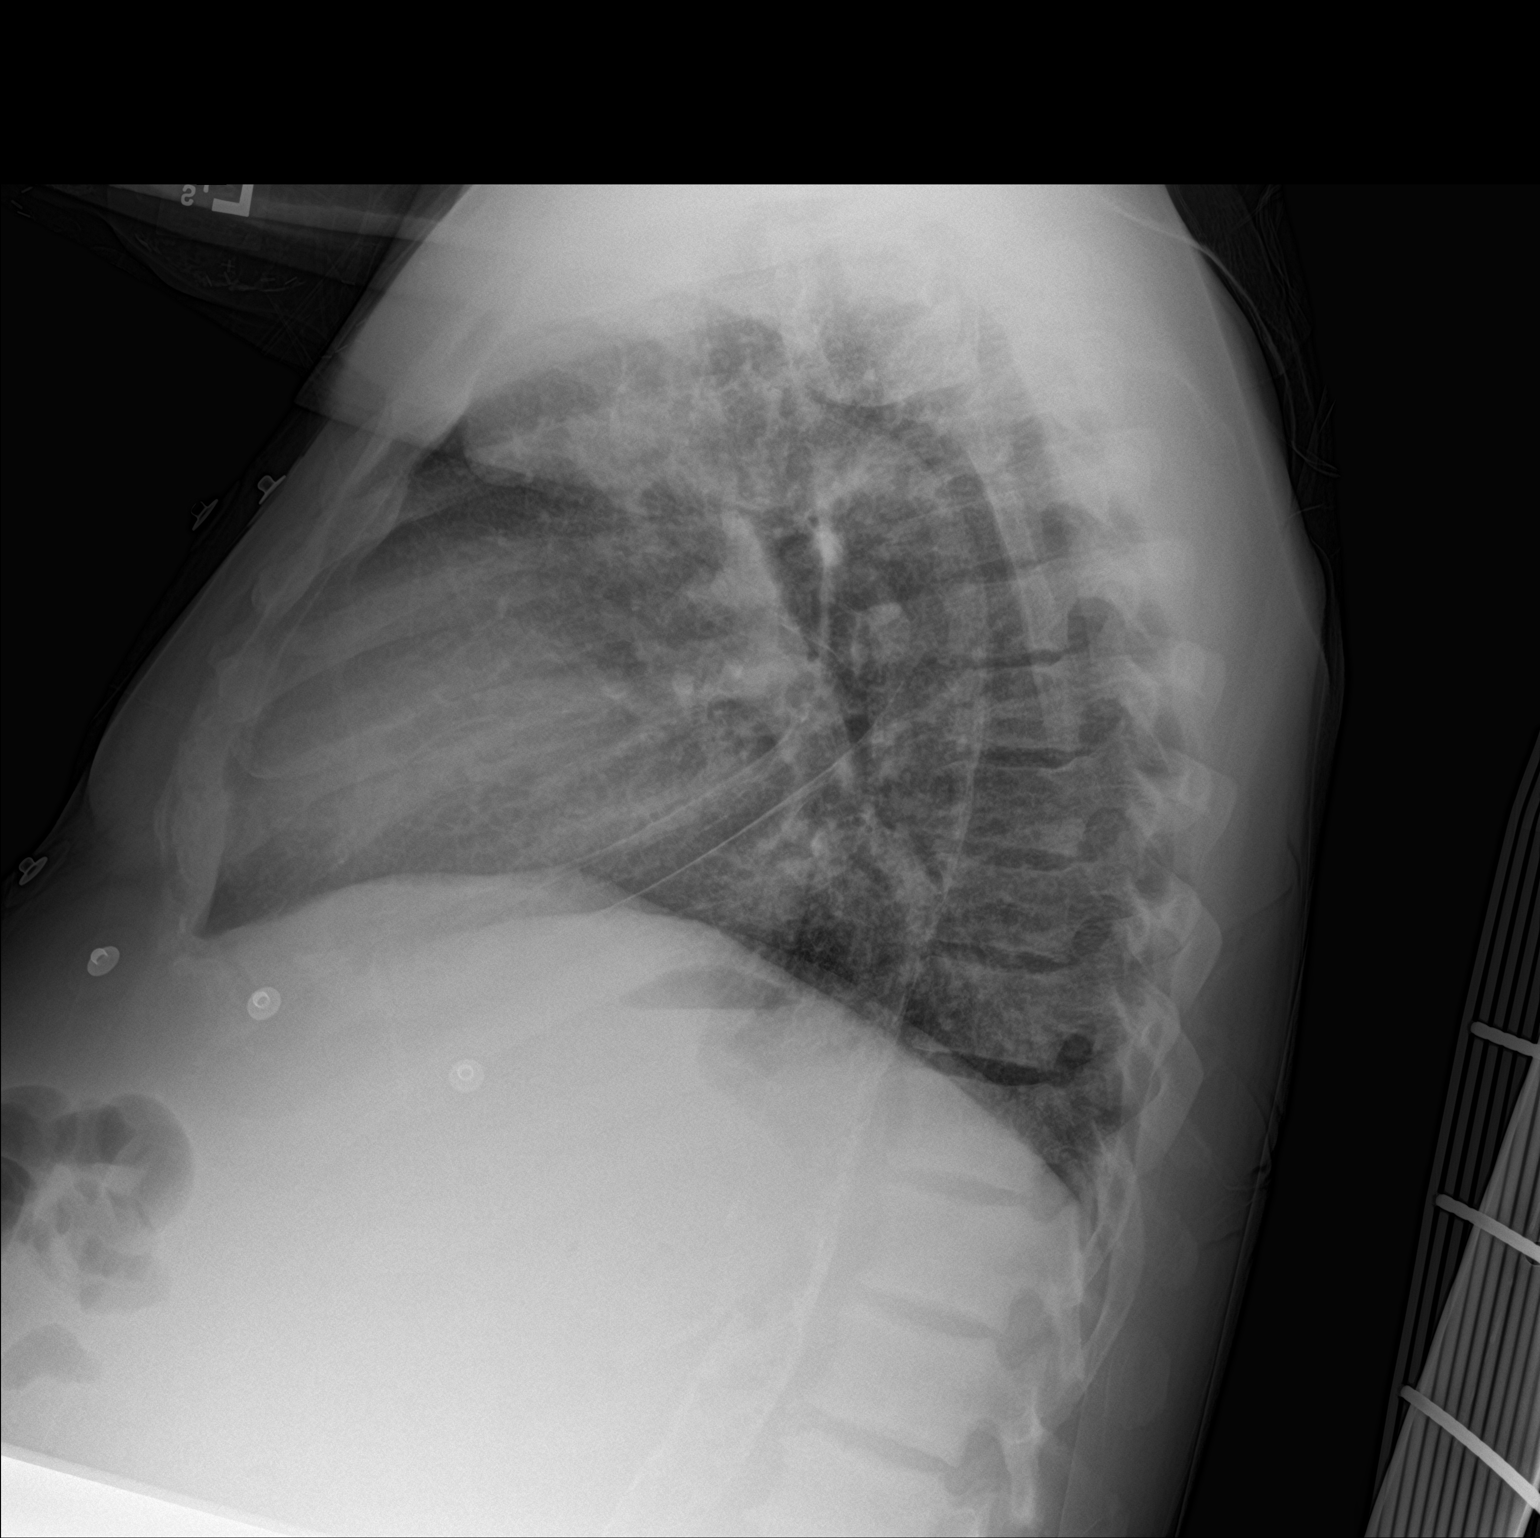

[2 of 2 positions shown; findings below may reference images not displayed]

FINDINGS: Cardiomegaly, apparent decreased size of the cardiac contours is
likely related to differences in technique. Unchanged bilateral
interstitial opacities. More focal mild bibasilar opacities are seen
likely due to atelectasis. No large pleural effusion or evidence of
pneumothorax.
IMPRESSION: Unchanged pulmonary edema.

Cardiomegaly.

## 2021-04-29 MED ORDER — SODIUM CHLORIDE 0.9 % IV SOLN: 250.0000 mL | INTRAVENOUS | Status: DC | PRN

## 2021-04-29 MED ORDER — FAMOTIDINE IN NACL 20-0.9 MG/50ML-% IV SOLN
20.0000 mg | Freq: Once | INTRAVENOUS | Status: AC | PRN
  Filled 2021-04-29: qty 50

## 2021-04-29 MED ORDER — BEBTELOVIMAB 175 MG/2 ML IV (EUA)
175.0000 mg | Freq: Once | INTRAMUSCULAR | Status: AC
  Administered 2021-04-30: 175 mg via INTRAVENOUS
  Filled 2021-04-29: qty 2

## 2021-04-29 MED ORDER — SODIUM CHLORIDE 0.9% FLUSH: 3.0000 mL | INTRAVENOUS | Status: DC | PRN

## 2021-04-29 MED ORDER — METHYLPREDNISOLONE SODIUM SUCC 125 MG IJ SOLR: 125.0000 mg | Freq: Once | INTRAMUSCULAR | Status: AC | PRN

## 2021-04-29 MED ORDER — ALBUTEROL SULFATE HFA 108 (90 BASE) MCG/ACT IN AERS: 2.0000 | INHALATION_SPRAY | Freq: Once | RESPIRATORY_TRACT | Status: DC | PRN

## 2021-04-29 MED ORDER — SODIUM CHLORIDE 0.9 % IV SOLN: INTRAVENOUS | Status: AC | PRN

## 2021-04-29 MED ORDER — EPINEPHRINE 0.3 MG/0.3ML IJ SOAJ
0.3000 mg | Freq: Once | INTRAMUSCULAR | Status: AC | PRN
  Filled 2021-04-29: qty 0.6

## 2021-04-29 MED ORDER — HYDROXYZINE HCL 25 MG PO TABS
25.0000 mg | ORAL_TABLET | Freq: Once | ORAL | Status: AC
  Administered 2021-04-29: 25 mg via ORAL
  Filled 2021-04-29: qty 1

## 2021-04-29 MED ORDER — GUAIFENESIN-DM 100-10 MG/5ML PO SYRP: 10.0000 mL | ORAL_SOLUTION | ORAL | Status: DC | PRN

## 2021-04-29 MED ORDER — HYDROCOD POLST-CPM POLST ER 10-8 MG/5ML PO SUER
5.0000 mL | Freq: Two times a day (BID) | ORAL | Status: DC | PRN
  Administered 2021-05-02 – 2021-05-04 (×2): 5 mL via ORAL
  Filled 2021-04-29 (×2): qty 5

## 2021-04-29 MED ORDER — SODIUM CHLORIDE 0.9% FLUSH
3.0000 mL | Freq: Two times a day (BID) | INTRAVENOUS | Status: DC
  Administered 2021-05-01 – 2021-05-02 (×4): 3 mL via INTRAVENOUS

## 2021-04-29 MED ORDER — ALBUTEROL SULFATE HFA 108 (90 BASE) MCG/ACT IN AERS
2.0000 | INHALATION_SPRAY | Freq: Four times a day (QID) | RESPIRATORY_TRACT | Status: DC
  Administered 2021-04-30: 2 via RESPIRATORY_TRACT
  Filled 2021-04-29: qty 6.7

## 2021-04-29 MED ORDER — DIPHENHYDRAMINE HCL 50 MG/ML IJ SOLN: 50.0000 mg | Freq: Once | INTRAMUSCULAR | Status: AC | PRN

## 2021-04-29 NOTE — ED Provider Notes (Signed)
Emergency Medicine Provider Triage Evaluation Note  Jordan [name] , a 55 y.o. male  was evaluated in triage.  Pt complains of full body itching for the past day. No rash noted with EMS. Reports hx of same several years ago for which he got a "shot." Denies any new foods, detergents, hygiene products. Dialysis patient - last dialyzed Friday. Supposed to dialyze this AM but could not make it due to his itching. Complains of abdominal swelling, SOB, and diarrhea over the last few days as well. No fevers or chills per EMS.   Review of Systems  Positive: + itching, abdominal swelling, SOB Negative: - fevers, chills, chest pain  Physical Exam  There were no vitals taken for this visit. Gen:   Awake, no distress   Resp:  Normal effort MSK:   Moves extremities without difficulty  Other:  No rash appreciated. Excoriations/open wounds noted to right thigh. Abdomen soft.   Medical Decision Making  Medically screening exam initiated at 10:19 AM.  Appropriate orders placed.  [name] was informed that the remainder of the evaluation will be completed by another provider, this initial triage assessment does not replace that evaluation, and the importance of remaining in the ED until their evaluation is complete.  Pruritis with complaints of abdominal swelling/dialysis patient. No rash appreciated.    Eustaquio Maize, PA-C 04/29/21 1025    Godfrey Pick, MD 04/29/21 [phone]

## 2021-04-29 NOTE — H&P (Signed)
Jordan [name] [id number] DOB: October 08, 1965 DOA: 04/29/2021     PCP: Merryl Hacker, No   Outpatient Specialists:    NEphrology:   Dr.Patel    Patient arrived to ER on 04/29/21 at 1008 Referred by Attending Toy Baker, MD   Patient coming from: home Lives  With family    Chief Complaint:   Chief Complaint  Patient presents with   Pruritis    HPI: Jordan [name] is a 55 y.o. male with medical history significant of ESRD oh HD (MWF), pruritis COPD, EtOH abuse, Anemia, HTN  Chronic diastolic CHF, A.fib, tobacco abuse  Presented with not feeling well  itching could not afford his Atarax missed his HD today  Reports he quit drinking EtOh months ago but still smokes  Has  t been vaccinated against COVID and boosted   Initial COVID TEST   POSITIVE,     Lab Results  Component Value Date   [place] (A) 04/29/2021   [place] NEGATIVE 04/08/2021   [place] NEGATIVE 03/28/2021   [place] NEGATIVE 03/25/2021     Regarding pertinent Chronic problems:     chronic CHF diastolic - last echo [id number] Grade II diastolic dysfunction      COPD - not  followed by pulmonology   on baseline oxygen  2L,       A. Fib -  - CHA2DS2 vas score 2   Not on anticoagulation secondary to Risk of Falls, recurrent bleeding         -  Rate control:  Currently controlled      ESRD On HD M,W, F on phoslo, sensipar, epogen  Lab Results  Component Value Date   CREATININE 16.64 (H) 04/29/2021   CREATININE 18.91 (H) 04/26/2021   CREATININE 13.26 (H) 04/08/2021    Chronic anemia - baseline hg Hemoglobin & Hematocrit  Recent Labs    04/08/21 1149 04/26/21 0430 04/29/21 1030  HGB 6.7* 7.9* 7.5*   On EPO and aurexia  While in ER: Noted to be fluid overload  CXR showing pulmonary edema K 4.1 Nephrology is aware will dialyze tomorrow     ED Triage Vitals  Enc Vitals Group     BP 04/29/21 1032 131/86     Pulse Rate 04/29/21 1032 (!) 103     Resp 04/29/21  1032 16     Temp 04/29/21 1032 97.8 F (36.6 C)     Temp Source 04/29/21 1032 Oral     SpO2 04/29/21 1032 100 %     Weight 04/29/21 1024 147 lb (66.7 kg)     Height 04/29/21 1024 '5\' 4"'$  (1.626 m)     Head Circumference --      Peak Flow --      Pain Score 04/29/21 1023 9     Pain Loc --      Pain Edu? --      Excl. in [place]? --   TMAX(24)@     _________________________________________ Significant initial  Findings: Abnormal Labs Reviewed  RESP PANEL BY RT-PCR (FLU A&B, COVID) ARPGX2 - Abnormal; Notable for the following components:      Result Value   SARS Coronavirus 2 by RT PCR POSITIVE (*)    All other components within normal limits  COMPREHENSIVE METABOLIC PANEL - Abnormal; Notable for the following components:   BUN 89 (*)    Creatinine, Ser 16.64 (*)    Albumin 2.8 (*)    GFR, Estimated 3 (*)    Anion gap 18 (*)  All other components within normal limits  CBC WITH DIFFERENTIAL/PLATELET - Abnormal; Notable for the following components:   WBC 16.8 (*)    RBC 2.39 (*)    Hemoglobin 7.5 (*)    HCT 23.7 (*)    Platelets 93 (*)    Neutro Abs 15.2 (*)    Lymphs Abs 0.6 (*)    Abs Immature Granulocytes 0.25 (*)    All other components within normal limits   ____________________________________________ Ordered    CXR -  pulmonary edema   ECG: Ordered    The recent clinical data is shown below. Vitals:   04/29/21 1431 04/29/21 1856 04/29/21 1900 04/29/21 2130  BP: 116/87 114/86 115/79   Pulse: 97 (!) 131 (!) 55 (!) 103  Resp: (!) 24 (!) 22    Temp:      TempSrc:      SpO2: 97% 100% 98% 99%  Weight:      Height:        WBC     Component Value Date/Time   WBC 16.8 (H) 04/29/2021 1030   LYMPHSABS 0.6 (L) 04/29/2021 1030   MONOABS 0.7 04/29/2021 1030   EOSABS 0.2 04/29/2021 1030   BASOSABS 0.0 04/29/2021 1030      UA  not ordered     Results for orders placed or performed during the hospital encounter of 04/29/21  Resp Panel by RT-PCR (Flu A&B, Covid)  Nasopharyngeal Swab     Status: Abnormal   Collection Time: 04/29/21  8:03 PM   Specimen: Nasopharyngeal Swab; Nasopharyngeal(NP) swabs in vial transport medium  Result Value Ref Range Status   SARS Coronavirus 2 by RT PCR POSITIVE (A) NEGATIVE Final         Influenza A by PCR NEGATIVE NEGATIVE Final      NEGATIVE Final         __________________________________________ ER Provider Called:  nephrology    Dr.Upton They Recommend admit to medicine   Will see in AM   plan to dialyze in AM _______________________________________________ Hospitalist was called for admission for volume overload in the setting HD non compliance and COVID infection  The following Work up has been ordered so far:  Orders Placed This Encounter  Procedures   Resp Panel by RT-PCR (Flu A&B, Covid) Nasopharyngeal Swab   Blood Culture (routine x 2)   DG Chest 2 View   Comprehensive metabolic panel   CBC with Differential   Lactic acid, plasma   D-dimer, quantitative   Procalcitonin   Lactate dehydrogenase   Ferritin   Triglycerides   Fibrinogen   C-reactive protein   Diet NPO time specified   Offer food (encourage patient to eat)   Cardiac monitoring   Insert peripheral IV x 2   Initiate Carrier Fluid Protocol   Place surgical mask on patient   Patient to wear surgical mask during transportation   Assess patient for ability to self-prone. If able (can move self in bed, ambulate) and stable (SpO2 and oxygen requirement):   RN/NT - Document specific oxygen requirements in [facility]   Notify EDP if new oxygen requirements escalates > 4L per minute [place]   RN to draw the following extra tubes:   Cardiac monitoring   Consult to nephrology   Consult to hospitalist   Airborne and Contact precautions   Pulse oximetry, continuous   ED EKG   ED EKG 12-Lead   Admit to Inpatient (patient's expected length of stay will be greater than 2 midnights or inpatient only  procedure)     Following Medications were ordered  in ER: Medications  sodium chloride flush (NS) 0.9 % injection 3 mL (0 mLs Intravenous Hold 04/29/21 2228)  sodium chloride flush (NS) 0.9 % injection 3 mL (has no administration in time range)  0.9 %  sodium chloride infusion (has no administration in time range)  hydrOXYzine (ATARAX/VISTARIL) tablet 25 mg (25 mg Oral Given 04/29/21 2003)        Consult Orders  (From admission, onward)           Start     Ordered   04/29/21 2149  Consult to hospitalist  Once       Provider:  (Not yet assigned)  Question Answer Comment  Place call to: [facility]   Reason for Consult Admit      04/29/21 2148              OTHER Significant initial  Findings:  labs showing:  Recent Labs  Lab 04/26/21 0430 04/29/21 1030  NA 139 140  K 4.8 4.1  CO2 20* 24  GLUCOSE 95 84  BUN 106* 89*  CREATININE 18.91* 16.64*  CALCIUM 8.4* 8.9    Cr    stable,    Lab Results  Component Value Date   CREATININE 16.64 (H) 04/29/2021   CREATININE 18.91 (H) 04/26/2021   CREATININE 13.26 (H) 04/08/2021    Recent Labs  Lab 04/26/21 0430 04/29/21 1030  AST 27 21  ALT 14 14  ALKPHOS 90 98  BILITOT 1.1 1.2  PROT 6.8 7.2  ALBUMIN 2.9* 2.8*   Lab Results  Component Value Date   CALCIUM 8.9 04/29/2021   PHOS 10.9 (H) 03/25/2021       Plt: Lab Results  Component Value Date   PLT 93 (L) 04/29/2021     COVID-19 Labs  Recent Labs    04/29/21 2159  DDIMER 3.15*  FERRITIN 939*  LDH 310*  CRP 17.0*    Lab Results  Component Value Date   SARSCOV2NAA POSITIVE (A) 04/29/2021   SARSCOV2NAA NEGATIVE 04/08/2021   SARSCOV2NAA NEGATIVE 03/28/2021   SARSCOV2NAA NEGATIVE 03/25/2021     Recent Labs  Lab 04/26/21 0430 04/29/21 1030  WBC 6.5 16.8*  NEUTROABS 5.3 15.2*  HGB 7.9* 7.5*  HCT 24.6* 23.7*  MCV 99.2 99.2  PLT 111* 93*    HG/HCT  stable,      Component Value Date/Time   HGB 7.5 (L) 04/29/2021 1030   HCT 23.7 (L) 04/29/2021 1030   MCV 99.2 04/29/2021 1030          BNP (last 3 results) Recent Labs    02/26/21 1700 03/10/21 2128 04/08/21 1149  BNP 2,730.3* 2,267.3* 2,828.4*    Cultures:    Component Value Date/Time   SDES BLOOD RIGHT ANTECUBITAL 01/13/2021 0246   SPECREQUEST  01/13/2021 0246    BOTTLES DRAWN AEROBIC AND ANAEROBIC Blood Culture adequate volume   CULT  01/13/2021 0246    NO GROWTH 5 DAYS Performed at [facility], [address] [place], [place] [address]    REPTSTATUS 01/18/2021 FINAL 01/13/2021 0246     Radiological Exams on Admission: DG Chest 2 View  Result Date: 04/29/2021 CLINICAL DATA:  Shortness of breath EXAM: CHEST - 2 VIEW COMPARISON:  Chest x-ray dated April 26, 2021 FINDINGS: Cardiomegaly, apparent decreased size of the cardiac contours is likely related to differences in technique. Unchanged bilateral interstitial opacities. More focal mild bibasilar opacities are seen likely due to atelectasis. No  large pleural effusion or evidence of pneumothorax. IMPRESSION: Unchanged pulmonary edema. Cardiomegaly. Electronically Signed   By: Yetta Glassman MD   On: 04/29/2021 10:53   _______________________________________________________________________________________________________ Latest  Blood pressure 115/79, pulse (!) 103, temperature 97.8 F (36.6 C), temperature source Oral, resp. rate (!) 22, height '5\' 4"'$  (1.626 m), weight 66.7 kg, SpO2 99 %.   Review of Systems:    Pertinent positives include:  fatigue  Constitutional:  No weight loss, night sweats, Fevers, chills, , weight loss  HEENT:  No headaches, Difficulty swallowing,Tooth/dental problems,Sore throat,  No sneezing, itching, ear ache, nasal congestion, post nasal drip,  Cardio-vascular:  No chest pain, Orthopnea, PND, anasarca, dizziness, palpitations.no Bilateral lower extremity swelling  GI:  No heartburn, indigestion, abdominal pain, nausea, vomiting, diarrhea, change in bowel habits, loss of appetite, melena, blood in stool,  hematemesis Resp:  no shortness of breath at rest. No dyspnea on exertion, No excess mucus, no productive cough, No non-productive cough, No coughing up of blood.No change in color of mucus.No wheezing. Skin:  no rash or lesions. No jaundice GU:  no dysuria, change in color of urine, no urgency or frequency. No straining to urinate.  No flank pain.  Musculoskeletal:  No joint pain or no joint swelling. No decreased range of motion. No back pain.  Psych:  No change in mood or affect. No depression or anxiety. No memory loss.  Neuro: no localizing neurological complaints, no tingling, no weakness, no double vision, no gait abnormality, no slurred speech, no confusion  All systems reviewed and apart from [place] all are negative _______________________________________________________________________________________________ Past Medical History:   Past Medical History:  Diagnosis Date   Anemia    Aortic atherosclerosis ([place]) 11/12/2019   CKD (chronic kidney disease)    Stage 5  Dialysis - M/W/F in [place], [place]   Dyspnea    tx with inhaler when sick   ED (erectile dysfunction)    Emphysema of lung ([place]) 11/12/2019   ETOH abuse    History of blood transfusion    Hypertension    Wears dentures      Past Surgical History:  Procedure Laterality Date   BASCILIC VEIN TRANSPOSITION Left 08/07/2016   Procedure: LEFT BASILIC VEIN TRANSPOSITION;  Surgeon: Angelia Mould, MD;  Location: [facility];  Service: Vascular;  Laterality: Left;   CLOSED REDUCTION NASAL FRACTURE N/A 08/11/2019   Procedure: CLOSED REDUCTION NASAL FRACTURE WITH STABILIZATION;  Surgeon: Irene Limbo, MD;  Location: [facility];  Service: Plastics;  Laterality: N/A;   COLONOSCOPY N/A 08/12/2016   Procedure: COLONOSCOPY;  Surgeon: Otis Brace, MD;  Location: [facility];  Service: Gastroenterology;  Laterality: N/A;   ESOPHAGOGASTRODUODENOSCOPY N/A 08/12/2016   Procedure: ESOPHAGOGASTRODUODENOSCOPY (EGD);  Surgeon:  Otis Brace, MD;  Location: [facility];  Service: Gastroenterology;  Laterality: N/A;   INSERTION OF DIALYSIS CATHETER N/A 08/07/2016   Procedure: INSERTION OF TUNNELED DIALYSIS CATHETER;  Surgeon: Angelia Mould, MD;  Location: [facility];  Service: Vascular;  Laterality: N/A;   LIGATION OF ARTERIOVENOUS  FISTULA Left 09/12/2016   Procedure: BANDING OF LEFT  ARTERIOVENOUS  FISTULA;  Surgeon: Angelia Mould, MD;  Location: [facility];  Service: Vascular;  Laterality: Left;   LOWER EXTREMITY INTERVENTION Right 12/02/2018   Procedure: LOWER EXTREMITY INTERVENTION;  Surgeon: Algernon Huxley, MD;  Location: [facility];  Service: Cardiovascular;  Laterality: Right;    Social History:  Ambulatory  independently       reports that he has been smoking cigarettes.  He has a 20.00 pack-year smoking history. He has never used smokeless tobacco. He reports current alcohol use of about 21.0 standard drinks of alcohol per week. He reports that he does not use drugs.     Family History:   Family History  Problem Relation Age of Onset   Diabetes Mother    Kidney failure Mother    Healthy Father    Kidney failure Brother    Healthy Sister    Kidney disease Daughter    Post-traumatic stress disorder Neg Hx    Bladder Cancer Neg Hx    Kidney cancer Neg Hx    ______________________________________________________________________________________________ Allergies: Allergies  Allergen Reactions   Lisinopril Swelling     Prior to Admission medications   Medication Sig Start Date End Date Taking? Authorizing Provider  ALPRAZolam (XANAX) 0.25 MG tablet Take 0.25 mg by mouth 2 (two) times daily as needed for anxiety (anxiety).    [provider]  AURYXIA 1 GM 210 MG(Fe) tablet Take 420 mg by mouth every Monday, Wednesday, and Friday with hemodialysis. 11/13/20   [provider]  calcium acetate (PHOSLO) 667 MG capsule Take 2 capsules (1,334 mg total) by mouth 3  (three) times daily with meals. 03/11/21   Lorella Nimrod, MD  cinacalcet (SENSIPAR) 60 MG tablet SMARTSIG:2 Tablet(s) By Mouth Every Evening 03/18/21   [provider]  epoetin alfa (EPOGEN) 4000 UNIT/ML injection Inject 1 mL (4,000 Units total) into the vein every Monday, Wednesday, and Friday with hemodialysis. 06/22/20   Enzo Bi, MD  fluticasone (FLONASE) 50 MCG/ACT nasal spray Place 1 spray into both nostrils daily for 15 days. 03/27/21 04/26/21  Arrien, Jimmy Picket, MD  hydrALAZINE (APRESOLINE) 50 MG tablet Take 50 mg by mouth 3 (three) times daily.    [provider]  hydrOXYzine (VISTARIL) 50 MG capsule Take 1 capsule (50 mg total) by mouth 3 (three) times daily as needed. 02/26/21   Cuthriell, Charline Bills, PA-C  Lidocaine 3 % CREA Apply 1 application topically 2 (two) times daily as needed (pain). 02/26/21   Cuthriell, Charline Bills, PA-C  thiamine 100 MG tablet Take 1 tablet (100 mg total) by mouth daily. 03/11/21   Lorella Nimrod, MD  triamcinolone cream (KENALOG) 0.1 % Apply 1 application topically 2 (two) times daily. 04/09/21   [provider]  VELPHORO 500 MG chewable tablet Chew 500 mg by mouth 3 (three) times daily. 03/15/21   [provider]  witch hazel-glycerin (TUCKS) pad Apply topically as needed for itching, hemorrhoids or irritation. Patient not taking: No sig reported 03/26/21   Arrien, Jimmy Picket, MD    ___________________________________________________________________________________________ Physical Exam: Vitals with BMI 04/29/2021 04/29/2021 04/29/2021  Height - - -  Weight - - -  BMI - - -  Systolic - [id number]  Diastolic - 79 86  Pulse [id number] 55 131     1. General:  in No  Acute distress    Chronically ill  -appearing 2. Psychological: Alert and  Oriented 3. Head/ENT:    Dry Mucous Membranes                          Head Non traumatic, neck supple                          Poor Dentition 4. SKIN:  decreased Skin turgor,  Skin clean Dry and  intact no rash 5. Heart: Regular rate and rhythm no  Murmur, no Rub or gallop 6. Lungs:  , no wheezes or crackles   7. Abdomen: Soft,  non-tender, Non distended   obese  bowel sounds present 8. Lower extremities: no clubbing, cyanosis, bilateral edema 9. Neurologically Grossly intact, moving all 4 extremities equally  intact 10. MSK: Normal range of motion    Chart has been reviewed  ______________________________________________________________________________________________  Assessment/Plan 55 y.o. male with medical history significant of ESRD oh HD (MWF), pruritis COPD, EtOH abuse, Anemia, HTN  Chronic diastolic CHF, A.fib, tobacco abuse   Admitted for    Present on Admission:  Pulmonary edema  Acute on chronic combined systolic and diastolic CHF (congestive heart failure) ([place]) - in the setting of HD noncompliance HD in AM     Tobacco abuse -  - Spoke about importance of quitting spent 5 minutes discussing options for treatment, prior attempts at quitting, and dangers of smoking  -At this point patient is    interested in quitting  - order nicotine patch   - nursing tobacco cessation protocol   Thrombocytopenia (HCC) - chronic stable   PAF (paroxysmal atrial fibrillation) ([place]) isolated event - not on anticoagulation rate controled, cont to monitor   Essential hypertension - resume BP meds as able to tolerate   COPD (chronic obstructive pulmonary disease) (HCC) - chronic stable Continue 2 L pt satting 100% on RA but requesting his home o2   Anemia in ESRD (end-stage renal disease) ([place]) - follow CBC and transfuse as needed cont home medes   Alcohol abuse - in remission   Leukocytosis - monitor for any evidence of bacterial infection    Pruritus - chronic continue home meds   COVID-19 virus infection - COVID infection -incidental finding patient is   vaccinated and boosted  So far mild URI symptoms    No evidence of hypoxia on home o2  Evidence of  fluid  overload   give one dose of MAB bebtelovimab  Hold off on steroids for now and reassess after HD     Obtain inflammatory markers Appear elevated likely high in the setting of poor renal clearance but will continue to monitor D.dimer elevated no CP in the setting of ESRD If SOB persists after HD may need further work up Supportive measures Avoid over aggressive fluid resuscitation Airborne precautions    Other plan as per orders.  DVT prophylaxis:   hep [place]    Code Status:    Code Status: Prior FULL CODE  e as per patient   I had personally discussed CODE STATUS with patient       Family Communication:   Family not at  Bedside    Disposition Plan:     To home once workup is complete and patient is stable   Following barriers for discharge:                            Electrolytes corrected                                                         Will need to be able to tolerate PO  Will need consultants to evaluate patient prior to discharge    Would benefit from PT/OT eval prior to DC  Ordered                                        Consults called:  nephrology is aware  Admission status:  ED Disposition     ED Disposition  [place]: [place] [[id number]]  Level of Care: Progressive [102]  Admit to Progressive based on following criteria: CARDIOVASCULAR & THORACIC of moderate stability with acute coronary syndrome symptoms/low risk myocardial infarction/hypertensive urgency/arrhythmias/heart failure potentially compromising stability and stable post cardiovascular intervention patients.  May admit patient to [facility] or [facility] if equivalent level of care is available:: No  Covid Evaluation: Confirmed COVID Positive  Diagnosis: Pulmonary edema [id number]  Admitting Physician: Toy Baker [[id number]]  Attending Physician: Toy Baker [[id number]]   Estimated length of stay: past midnight tomorrow  Certification:: I certify this patient will need inpatient services for at least 2 midnights           inpatient     I Expect 2 midnight stay secondary to severity of patient's current illness need for inpatient interventions justified by the following:    Severe lab/radiological/exam abnormalities including:    Pulmonary edema and extensive comorbidities including:  substance abuse   CHF  COPD/asthma ESRD on HD    That are currently affecting medical management.   I expect  patient to be hospitalized for 2 midnights requiring inpatient medical care.  Patient is at high risk for adverse outcome (such as loss of life or disability) if not treated.  Indication for inpatient stay as follows:   Fluid overload needing HD  Need for IV antibiotics, IV fluids, IV rate controling medications, IV antihypertensives, IV pain medications, IV anticoagulation    Level of care   progressive tele indefinitely please discontinue once patient no longer qualifies COVID-19 Labs    Lab Results  Component Value Date   SARSCOV2NAA POSITIVE (A) 04/29/2021     Precautions: admitted as  covid positive Airborne and Contact precautions     PPE: Used by the provider:   N95  eye Goggles,  Gloves     Raneen Jaffer 04/30/2021, 12:37 AM    [facility]     after 2 AM please page floor coverage PA If 7AM-7PM, please contact the day team taking care of the patient using [email or web address]   Patient was evaluated in the context of the global COVID-19 pandemic, which necessitated consideration that the patient might be at risk for infection with the SARS-CoV-2 virus that causes COVID-19. Institutional protocols and algorithms that pertain to the evaluation of patients at risk for COVID-19 are in a state of rapid change based on information released by regulatory bodies including the CDC and federal and state organizations. These policies and  algorithms were followed during the patient's care.

## 2021-04-29 NOTE — ED Notes (Signed)
Pt ambulatory to BR with steady gait.

## 2021-04-29 NOTE — ED Notes (Signed)
Pt named called again for updated vitals, no response

## 2021-04-29 NOTE — ED Triage Notes (Signed)
Pt here via EMS with complaints of all over purities. Pt missed HD today and endorses SOB, and abdominal swelling.

## 2021-04-29 NOTE — ED Provider Notes (Addendum)
[facility] DEPARTMENT Provider Note   CSN: [id number] Arrival date & time: 04/29/21  1008     History Chief Complaint  Patient presents with  . Pruritis    PUAL Jordan [name] is a 55 y.o. male.  The history is provided by the patient. No language interpreter was used.  Shortness of Breath Severity:  Moderate Onset quality:  Gradual Duration:  1 day Timing:  Constant Progression:  Worsening Chronicity:  Recurrent Relieved by:  Nothing Worsened by:  Nothing Ineffective treatments:  None tried  Pt reports he could not go to dialysis because he was feeling weak and short of breath.  Pt reports he was itching bad.  Pt has a history of uremic puritis.  Pt states he can not afford atarax because insurance does not cover.     Past Medical History:  Diagnosis Date  . Anemia   . Aortic atherosclerosis ([place]) 11/12/2019  . CKD (chronic kidney disease)    Stage 5  Dialysis - M/W/F in [place], [place]  . Dyspnea    tx with inhaler when sick  . ED (erectile dysfunction)   . Emphysema of lung ([place]) 11/12/2019  . ETOH abuse   . History of blood transfusion   . Hypertension   . Wears dentures     Patient Active Problem List   Diagnosis Date Noted  . Thrombocytopenia ([place]) 04/08/2021  . COPD (chronic obstructive pulmonary disease) ([place]) 04/08/2021  . CHF (congestive heart failure) ([place]) 03/25/2021  . PAF (paroxysmal atrial fibrillation) ([place]) 03/20/2021  . Chronic diastolic CHF (congestive heart failure) ([place]) 03/20/2021  . Alcohol abuse 03/20/2021  . Vision loss of left eye 03/20/2021  . SOB (shortness of breath) 03/20/2021  . Shortness of breath 03/10/2021  . Abscess of buttock 03/02/2021  . Fluid overload 03/02/2021  . Multifocal pneumonia 01/13/2021  . Current mild episode of major depressive disorder without prior episode ([place]) 04/09/2020  . Atrial fibrillation with RVR ([place]) 04/04/2020  . Non-compliance with renal dialysis ([place]) 03/21/2020  .  Acute CHF (congestive heart failure) ([place]) 03/12/2020  . 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 Acute on chronic combined systolic and diastolic CHF (congestive heart failure) ([place]) 02/03/2020  . Acute respiratory failure with hypoxia ([place]) 11/12/2019  . Aortic atherosclerosis ([place]) 11/12/2019  . Emphysema of lung ([place]) 11/12/2019  . Acute on chronic congestive heart failure ([place]) 10/22/2019  . Volume overload 10/05/2019  . Elevated troponin 05/17/2018  . Anemia in ESRD (end-stage renal disease) ([place]) 05/17/2018  . ESRD on dialysis ([place]) 05/26/2017  . Essential hypertension 05/26/2017  . Moderate protein-calorie malnutrition ([place]) 08/13/2016  . Secondary hyperparathyroidism of renal origin ([place]) 08/13/2016  . Tobacco abuse 08/04/2016    Past Surgical History:  Procedure Laterality Date  . BASCILIC VEIN TRANSPOSITION Left 08/07/2016   Procedure: LEFT BASILIC VEIN TRANSPOSITION;  Surgeon: Angelia Mould, MD;  Location: [facility];  Service: Vascular;  Laterality: Left;  . CLOSED REDUCTION NASAL FRACTURE N/A 08/11/2019   Procedure: CLOSED REDUCTION NASAL FRACTURE WITH STABILIZATION;  Surgeon: Irene Limbo, MD;  Location: [facility];  Service: Plastics;  Laterality: N/A;  . COLONOSCOPY N/A 08/12/2016   Procedure: COLONOSCOPY;  Surgeon: Otis Brace, MD;  Location: [facility];  Service: Gastroenterology;  Laterality: N/A;  . ESOPHAGOGASTRODUODENOSCOPY N/A 08/12/2016   Procedure: ESOPHAGOGASTRODUODENOSCOPY (EGD);  Surgeon: Otis Brace, MD;  Location: [facility];  Service: Gastroenterology;  Laterality: N/A;  . INSERTION OF DIALYSIS CATHETER N/A 08/07/2016  Procedure: INSERTION OF TUNNELED DIALYSIS CATHETER;  Surgeon: Angelia Mould, MD;  Location: [facility];  Service: Vascular;  Laterality: N/A;  . LIGATION OF ARTERIOVENOUS  FISTULA Left 09/12/2016   Procedure: BANDING OF LEFT  ARTERIOVENOUS  FISTULA;  Surgeon: Angelia Mould, MD;   Location: [facility];  Service: Vascular;  Laterality: Left;  . LOWER EXTREMITY INTERVENTION Right 12/02/2018   Procedure: LOWER EXTREMITY INTERVENTION;  Surgeon: Algernon Huxley, MD;  Location: [facility];  Service: Cardiovascular;  Laterality: Right;       Family History  Problem Relation Age of Onset  . Diabetes Mother   . Kidney failure Mother   . Healthy Father   . Kidney failure Brother   . Healthy Sister   . Kidney disease Daughter   . Post-traumatic stress disorder Neg Hx   . Bladder Cancer Neg Hx   . Kidney cancer Neg Hx     Social History   Tobacco Use  . Smoking status: Every Day    Packs/day: 0.50    Years: 40.00    Pack years: 20.00    Types: Cigarettes  . Smokeless tobacco: Never  Vaping Use  . Vaping Use: Never used  Substance Use Topics  . Alcohol use: Yes    Alcohol/week: 21.0 standard drinks    Types: 21 Cans of beer per week    Comment: last drink 6 months ago  . Drug use: No    Home Medications Prior to Admission medications   Medication Sig Start Date End Date Taking? Authorizing Provider  ALPRAZolam (XANAX) 0.25 MG tablet Take 0.25 mg by mouth 2 (two) times daily as needed for anxiety (anxiety).    [provider]  AURYXIA 1 GM 210 MG(Fe) tablet Take 420 mg by mouth every Monday, Wednesday, and Friday with hemodialysis. 11/13/20   [provider]  calcium acetate (PHOSLO) 667 MG capsule Take 2 capsules (1,334 mg total) by mouth 3 (three) times daily with meals. 03/11/21   Lorella Nimrod, MD  cinacalcet (SENSIPAR) 60 MG tablet SMARTSIG:2 Tablet(s) By Mouth Every Evening 03/18/21   [provider]  epoetin alfa (EPOGEN) 4000 UNIT/ML injection Inject 1 mL (4,000 Units total) into the vein every Monday, Wednesday, and Friday with hemodialysis. 06/22/20   Enzo Bi, MD  fluticasone (FLONASE) 50 MCG/ACT nasal spray Place 1 spray into both nostrils daily for 15 days. 03/27/21 04/26/21  Arrien, Jimmy Picket, MD  hydrALAZINE  (APRESOLINE) 50 MG tablet Take 50 mg by mouth 3 (three) times daily.    [provider]  hydrOXYzine (VISTARIL) 50 MG capsule Take 1 capsule (50 mg total) by mouth 3 (three) times daily as needed. 02/26/21   Cuthriell, Charline Bills, PA-C  Lidocaine 3 % CREA Apply 1 application topically 2 (two) times daily as needed (pain). 02/26/21   Cuthriell, Charline Bills, PA-C  thiamine 100 MG tablet Take 1 tablet (100 mg total) by mouth daily. 03/11/21   Lorella Nimrod, MD  triamcinolone cream (KENALOG) 0.1 % Apply 1 application topically 2 (two) times daily. 04/09/21   [provider]  VELPHORO 500 MG chewable tablet Chew 500 mg by mouth 3 (three) times daily. 03/15/21   [provider]  witch hazel-glycerin (TUCKS) pad Apply topically as needed for itching, hemorrhoids or irritation. Patient not taking: No sig reported 03/26/21   Arrien, Jimmy Picket, MD    Allergies    Lisinopril  Review of Systems   Review of Systems  Respiratory:  Positive for shortness  of breath.   All other systems reviewed and are negative.  Physical Exam Updated Vital Signs BP 116/87   Pulse 97   Temp 97.8 F (36.6 C) (Oral)   Resp (!) 24   Ht '5\' 4"'$  (1.626 m)   Wt 66.7 kg   SpO2 97%   BMI 25.23 kg/m   Physical Exam Vitals and nursing note reviewed.  Constitutional:      Appearance: He is well-developed.  HENT:     Head: Normocephalic and atraumatic.  Eyes:     Conjunctiva/sclera: Conjunctivae normal.  Cardiovascular:     Rate and Rhythm: Normal rate and regular rhythm.     Heart sounds: No murmur heard. Pulmonary:     Effort: Pulmonary effort is normal. No respiratory distress.     Breath sounds: Normal breath sounds.  Abdominal:     Palpations: Abdomen is soft.     Tenderness: There is no abdominal tenderness.  Musculoskeletal:     Cervical back: Neck supple.  Skin:    General: Skin is warm and dry.  Neurological:     General: No focal deficit present.     Mental Status: He is  alert.    ED Results / Procedures / Treatments   Labs (all labs ordered are listed, but only abnormal results are displayed) Labs Reviewed  COMPREHENSIVE METABOLIC PANEL - Abnormal; Notable for the following components:      Result Value   BUN 89 (*)    Creatinine, Ser 16.64 (*)    Albumin 2.8 (*)    GFR, Estimated 3 (*)    Anion gap 18 (*)    All other components within normal limits  CBC WITH DIFFERENTIAL/PLATELET - Abnormal; Notable for the following components:   WBC 16.8 (*)    RBC 2.39 (*)    Hemoglobin 7.5 (*)    HCT 23.7 (*)    Platelets 93 (*)    Neutro Abs 15.2 (*)    Lymphs Abs 0.6 (*)    Abs Immature Granulocytes 0.25 (*)    All other components within normal limits  RESP PANEL BY RT-PCR (FLU A&B, COVID) [id number]    EKG None  Radiology DG Chest 2 View  Result Date: 04/29/2021 CLINICAL DATA:  Shortness of breath EXAM: CHEST - 2 VIEW COMPARISON:  Chest x-ray dated April 26, 2021 FINDINGS: Cardiomegaly, apparent decreased size of the cardiac contours is likely related to differences in technique. Unchanged bilateral interstitial opacities. More focal mild bibasilar opacities are seen likely due to atelectasis. No large pleural effusion or evidence of pneumothorax. IMPRESSION: Unchanged pulmonary edema. Cardiomegaly. Electronically Signed   By: Yetta Glassman MD   On: 04/29/2021 10:53    Procedures Procedures   Medications Ordered in ED Medications  hydrOXYzine (ATARAX/VISTARIL) tablet 25 mg (has no administration in time range)    ED Course  I have reviewed the triage vital signs and the nursing notes.  Pertinent labs & imaging results that were available during my care of the patient were reviewed by me and considered in my medical decision making (see chart for details).    MDM Rules/Calculators/A&P                           MDM:  02 sats 96 percent  on room air.  Pt uses 2 liters of 02 at home as needed.  I discussed with Nephrology. Pt will have  dialysis tomorrow.  Hospitalist consulted for admission  Final  Clinical Impression(s) / ED Diagnoses Final diagnoses:  COVID  Pruritus  Weakness    Rx / DC Orders ED Discharge Orders     None        Fransico Meadow, PA-C 04/29/21 2310    Fransico Meadow, [place] 04/30/21 1646    Davonna Belling, MD 05/01/21 0030

## 2021-04-29 NOTE — ED Notes (Addendum)
NT attempted to hook pt up to monitor and get an EKG. Pt refusing at the moment saying "I'm too hungry. I need you to call the kitchen and get me food first". RN notified.

## 2021-04-29 NOTE — ED Notes (Signed)
Pt named called for updated vitals, no response

## 2021-04-29 NOTE — ED Notes (Signed)
Pt placed on his normal 2L that he wears PRN at home

## 2021-04-29 NOTE — ED Notes (Signed)
Attempted IV stick x 2  

## 2021-04-30 ENCOUNTER — Encounter (HOSPITAL_COMMUNITY): Payer: Self-pay | Admitting: Internal Medicine

## 2021-04-30 DIAGNOSIS — D696 Thrombocytopenia, unspecified: Secondary | ICD-10-CM

## 2021-04-30 DIAGNOSIS — N186 End stage renal disease: Secondary | ICD-10-CM

## 2021-04-30 DIAGNOSIS — D631 Anemia in chronic kidney disease: Secondary | ICD-10-CM

## 2021-04-30 DIAGNOSIS — E8770 Fluid overload, unspecified: Secondary | ICD-10-CM

## 2021-04-30 DIAGNOSIS — U071 COVID-19: Secondary | ICD-10-CM

## 2021-04-30 DIAGNOSIS — I480 Paroxysmal atrial fibrillation: Secondary | ICD-10-CM

## 2021-04-30 LAB — LACTIC ACID, PLASMA
Lactic Acid, Venous: 2.5 mmol/L (ref 0.5–1.9)
Lactic Acid, Venous: 2.2 mmol/L (ref 0.5–1.9)

## 2021-04-30 LAB — PROCALCITONIN: Procalcitonin: 21.91 ng/mL

## 2021-04-30 LAB — FERRITIN: Ferritin: 857 ng/mL — ABNORMAL HIGH (ref 24–336)

## 2021-04-30 LAB — TSH: TSH: 1.016 u[IU]/mL (ref 0.350–4.500)

## 2021-04-30 LAB — COMPREHENSIVE METABOLIC PANEL
ALT: 13 U/L (ref 0–44)
AST: 24 U/L (ref 15–41)
Albumin: 2.8 g/dL — ABNORMAL LOW (ref 3.5–5.0)
Alkaline Phosphatase: 118 U/L (ref 38–126)
Anion gap: 23 — ABNORMAL HIGH (ref 5–15)
BUN: 99 mg/dL — ABNORMAL HIGH (ref 6–20)
CO2: 16 mmol/L — ABNORMAL LOW (ref 22–32)
Calcium: 8.3 mg/dL — ABNORMAL LOW (ref 8.9–10.3)
Chloride: 98 mmol/L (ref 98–111)
Creatinine, Ser: 16.89 mg/dL — ABNORMAL HIGH (ref 0.61–1.24)
GFR, Estimated: 3 mL/min — ABNORMAL LOW (ref >60)
Glucose, Bld: 94 mg/dL (ref 70–99)
Potassium: 4.6 mmol/L (ref 3.5–5.1)
Sodium: 137 mmol/L (ref 135–145)
Total Bilirubin: 0.9 mg/dL (ref 0.3–1.2)
Total Protein: 6.7 g/dL (ref 6.5–8.1)

## 2021-04-30 LAB — MAGNESIUM: Magnesium: 2.4 mg/dL (ref 1.7–2.4)

## 2021-04-30 LAB — C-REACTIVE PROTEIN: CRP: 15.6 mg/dL — ABNORMAL HIGH (ref <1.0)

## 2021-04-30 LAB — LACTATE DEHYDROGENASE: LDH: 371 U/L — ABNORMAL HIGH (ref 98–192)

## 2021-04-30 LAB — D-DIMER, QUANTITATIVE: D-Dimer, Quant: 5.89 ug/mL-FEU — ABNORMAL HIGH (ref 0.00–0.50)

## 2021-04-30 LAB — PHOSPHORUS: Phosphorus: 8.5 mg/dL — ABNORMAL HIGH (ref 2.5–4.6)

## 2021-04-30 MED ORDER — THIAMINE HCL 100 MG PO TABS
100.0000 mg | ORAL_TABLET | Freq: Every day | ORAL | Status: DC
  Administered 2021-04-30 – 2021-05-04 (×5): 100 mg via ORAL
  Filled 2021-04-30 (×5): qty 1

## 2021-04-30 MED ORDER — GUAIFENESIN ER 600 MG PO TB12
600.0000 mg | ORAL_TABLET | Freq: Two times a day (BID) | ORAL | Status: DC
  Administered 2021-04-30 – 2021-05-04 (×9): 600 mg via ORAL
  Filled 2021-04-30 (×9): qty 1

## 2021-04-30 MED ORDER — ACETAMINOPHEN 325 MG PO TABS
650.0000 mg | ORAL_TABLET | Freq: Four times a day (QID) | ORAL | Status: DC | PRN
  Administered 2021-05-01 – 2021-05-02 (×2): 650 mg via ORAL
  Filled 2021-04-30 (×2): qty 2

## 2021-04-30 MED ORDER — HYDROXYZINE HCL 10 MG PO TABS
10.0000 mg | ORAL_TABLET | Freq: Three times a day (TID) | ORAL | Status: DC | PRN
  Administered 2021-04-30 – 2021-05-04 (×10): 10 mg via ORAL
  Filled 2021-04-30 (×11): qty 1

## 2021-04-30 MED ORDER — SODIUM CHLORIDE 0.9 % IV SOLN: 250.0000 mL | INTRAVENOUS | Status: DC | PRN

## 2021-04-30 MED ORDER — CINACALCET HCL 30 MG PO TABS
30.0000 mg | ORAL_TABLET | Freq: Every day | ORAL | Status: DC
  Administered 2021-04-30 – 2021-05-04 (×5): 30 mg via ORAL
  Filled 2021-04-30 (×5): qty 1

## 2021-04-30 MED ORDER — ALPRAZOLAM 0.25 MG PO TABS
0.2500 mg | ORAL_TABLET | Freq: Two times a day (BID) | ORAL | Status: DC | PRN
  Administered 2021-05-02 – 2021-05-04 (×3): 0.25 mg via ORAL
  Filled 2021-04-30 (×3): qty 1

## 2021-04-30 MED ORDER — ACETAMINOPHEN 650 MG RE SUPP: 650.0000 mg | Freq: Four times a day (QID) | RECTAL | Status: DC | PRN

## 2021-04-30 MED ORDER — NICOTINE 21 MG/24HR TD PT24
21.0000 mg | MEDICATED_PATCH | Freq: Every day | TRANSDERMAL | Status: DC
  Administered 2021-04-30 – 2021-05-04 (×5): 21 mg via TRANSDERMAL
  Filled 2021-04-30 (×5): qty 1

## 2021-04-30 MED ORDER — ALBUTEROL SULFATE HFA 108 (90 BASE) MCG/ACT IN AERS
2.0000 | INHALATION_SPRAY | RESPIRATORY_TRACT | Status: DC | PRN
  Administered 2021-05-01 – 2021-05-03 (×2): 2 via RESPIRATORY_TRACT

## 2021-04-30 MED ORDER — HYDRALAZINE HCL 50 MG PO TABS
50.0000 mg | ORAL_TABLET | Freq: Three times a day (TID) | ORAL | Status: DC
  Administered 2021-04-30 – 2021-05-04 (×12): 50 mg via ORAL
  Filled 2021-04-30 (×13): qty 1

## 2021-04-30 MED ORDER — CHLORHEXIDINE GLUCONATE CLOTH 2 % EX PADS
6.0000 | MEDICATED_PAD | Freq: Every day | CUTANEOUS | Status: DC
  Administered 2021-05-03 – 2021-05-04 (×2): 6 via TOPICAL

## 2021-04-30 MED ORDER — SODIUM CHLORIDE 0.9% FLUSH
3.0000 mL | Freq: Two times a day (BID) | INTRAVENOUS | Status: DC
  Administered 2021-04-30 – 2021-05-04 (×8): 3 mL via INTRAVENOUS

## 2021-04-30 MED ORDER — HYDROCORTISONE 1 % EX OINT
TOPICAL_OINTMENT | Freq: Two times a day (BID) | CUTANEOUS | Status: DC | PRN
  Filled 2021-04-30 (×2): qty 28

## 2021-04-30 MED ORDER — CALCIUM ACETATE (PHOS BINDER) 667 MG PO CAPS
1334.0000 mg | ORAL_CAPSULE | Freq: Three times a day (TID) | ORAL | Status: DC
  Administered 2021-04-30 – 2021-05-04 (×15): 1334 mg via ORAL
  Filled 2021-04-30 (×13): qty 2

## 2021-04-30 MED ORDER — SODIUM CHLORIDE 0.9% FLUSH: 3.0000 mL | INTRAVENOUS | Status: DC | PRN

## 2021-04-30 NOTE — Plan of Care (Signed)
Care plan reviewed with patient.

## 2021-04-30 NOTE — ED Notes (Signed)
Breakfast Orders Placed °

## 2021-04-30 NOTE — ED Notes (Signed)
Attempt to call report to inpatient unit, nurse not available at this time, message left to return call

## 2021-04-30 NOTE — Evaluation (Signed)
Physical Therapy Evaluation Patient Details Name: Jordan [name] MRN: [id number] DOB: May 13, 1966 Today's Date: 04/30/2021   History of Present Illness  55yo male admitted 04/29/21 with malaise, missed HD. Found to have pulmonary edema and acute on chronic CHF due to missed HD session, also incidentally covid +. PMH ESRD on HD, COPD, EtOH abuse, HTN, CHF, A-fib, tobacco abuse  Clinical Impression   Patient received sitting on edge of ED stretcher, pleasant and cooperative with therapy. Able to mobilize on an independent basis without device. Unable to get SpO2 pleth to get an accurate signal, but no physical signs of hypoxia or O2 desat noted. Left sitting at edge of stretcher with all needs met. No need for skilled PT services, PT signing off. Thank you for the opportunity to participate in his care!     Follow Up Recommendations No PT follow up    Equipment Recommendations  None recommended by PT    Recommendations for Other Services       Precautions / Restrictions Precautions Precautions: Other (comment) Precaution Comments: covid + Restrictions Weight Bearing Restrictions: No      Mobility  Bed Mobility               General bed mobility comments: sitting on edge of stretcher upon entry    Transfers Overall transfer level: Independent Equipment used: None                Ambulation/Gait Ambulation/Gait assistance: Independent Gait Distance (Feet): 60 Feet Assistive device: None Gait Pattern/deviations: WFL(Within Functional Limits);Step-through pattern Gait velocity: WNL   General Gait Details: no significant gait deviations noted, no LOB or unsteadiness  Stairs            Wheelchair Mobility    Modified Rankin (Stroke Patients Only)       Balance Overall balance assessment: Independent                                           Pertinent Vitals/Pain Pain Assessment: Faces Faces Pain Scale: Hurts little more Pain  Location: "spots I have been itching and around my rear" Pain Descriptors / Indicators: Discomfort Pain Intervention(s): Limited activity within patient's tolerance;Monitored during session    Home Living Family/patient expects to be discharged to:: Private residence Living Arrangements: Alone   Type of Home: House Home Access: Stairs to enter   Technical brewer of Steps: 4 no rails Home Layout: One level Home Equipment: Shower seat      Prior Function Level of Independence: Independent         Comments: has been pretty sick the past couple weeks, usually works as Office manager        Extremity/Trunk Assessment   Upper Extremity Assessment Upper Extremity Assessment: Overall WFL for tasks assessed    Lower Extremity Assessment Lower Extremity Assessment: Overall WFL for tasks assessed    Cervical / Trunk Assessment Cervical / Trunk Assessment: Normal  Communication   Communication: No difficulties  Cognition Arousal/Alertness: Awake/alert Behavior During Therapy: WFL for tasks assessed/performed;Flat affect Overall Cognitive Status: Within Functional Limits for tasks assessed                                        General Comments General comments (  skin integrity, edema, etc.): uanble to get accurate signal from SpO2 pleth but no SOB or other physical signs of desat    Exercises     Assessment/Plan    PT Assessment Patent does not need any further PT services  PT Problem List         PT Treatment Interventions      PT Goals (Current goals can be found in the Care Plan section)  Acute Rehab PT Goals Patient Stated Goal: feel better PT Goal Formulation: With patient Time For Goal Achievement: 05/14/21 Potential to Achieve Goals: Good    Frequency     Barriers to discharge        Co-evaluation               AM-PAC PT "6 Clicks" Mobility  Outcome Measure Help needed turning from  your back to your side while in a flat bed without using bedrails?: None Help needed moving from lying on your back to sitting on the side of a flat bed without using bedrails?: None Help needed moving to and from a bed to a chair (including a wheelchair)?: None Help needed standing up from a chair using your arms (e.g., wheelchair or bedside chair)?: None Help needed to walk in hospital room?: None Help needed climbing 3-5 steps with a railing? : None 6 Click Score: 24    End of Session   Activity Tolerance: Patient tolerated treatment well Patient left: in bed;with call bell/phone within reach Nurse Communication: Mobility status PT Visit Diagnosis: Muscle weakness (generalized) (M62.81)    Time: 6484-7207 PT Time Calculation (min) (ACUTE ONLY): 19 min   Charges:   PT Evaluation $PT Eval Low Complexity: 1 Low         Windell Norfolk, DPT, PN2   Supplemental Physical Therapist [place]    Pager [phone] Acute Rehab Office [phone]

## 2021-04-30 NOTE — Progress Notes (Signed)
PROGRESS NOTE    [name]  [id number] DOB: 1965-10-20 DOA: 04/29/2021 PCP: Pcp, No    Chief Complaint  Patient presents with   Pruritis    Brief Narrative:  Jordan [name] is a 55 y.o. male with medical history significant of ESRD oh HD (MWF), pruritis COPD, EtOH abuse, Anemia, HTN  Chronic diastolic CHF, A.fib, tobacco abuse   Presented with not feeling well  itching could not afford his Atarax missed his HD today  Reports he quit drinking EtOh months ago but still smokes  Subjective:  He is on 2liter oxygen, not in acute respiratory distress, denies chest pain, does has cough, report more DOE , no fever Reports left lower extremity was swollen for a few days  Assessment & Plan:   Active Problems:   Tobacco abuse   ESRD on dialysis [facility])   Essential hypertension   Anemia in ESRD (end-stage renal disease) (HCC)   Volume overload   Acute respiratory failure with hypoxia (HCC)   Acute on chronic combined systolic and diastolic CHF (congestive heart failure) (HCC)   PAF (paroxysmal atrial fibrillation) (HCC)   Alcohol abuse   Thrombocytopenia (HCC)   COPD (chronic obstructive pulmonary disease) (HCC)   Pulmonary edema   Pruritus   COVID-19 virus infection  Short of breath/pulmonary edema -Not in acute respiratory distress, missed dialysis -Nephrology consulted for dialysis management  COVID-19 infection Vaccine needed and boosted, received 1 dose of monoclonal antibody bebtelovimab -He is on home O2 at 2 L, currently he is on 2 L oxygen -Chest x-ray with pulmonary edema  Left lower extremity edema Venous doppler to rule out DVT  ESRD on HD Monday Wednesday Friday, noncompliance with dialysis Nephrology consulted for dialysis support   History of PAF Currently sinus rhythm Does not appear to be on anticoagulation at home Not on any rate control agent either He is not aware of this diagnosis   Thrombocytopenia Appear chronic at baseline  COPD  chronic hypoxic respiratory failure on home O2 2 L Appears stable at baseline Continue home medication  Cigarette smoking Nicotine patch provided    Body mass index is 25.23 kg/m.Marland Kitchen      Unresulted Labs (From admission, onward)     Start     Ordered   04/30/21 0500  D-dimer, quantitative  Daily,   R      04/29/21 2328   04/30/21 0500  CBC with Differential/Platelet  Daily,   R      04/29/21 2328   04/30/21 0500  Comprehensive metabolic panel  Daily,   R      04/29/21 2328   04/30/21 0500  Ferritin  Daily,   R      04/29/21 2328   04/30/21 0500  Magnesium  Daily,   R      04/29/21 2328   04/30/21 0500  Phosphorus  Daily,   R      04/29/21 2328   04/30/21 0500  CBC WITH DIFFERENTIAL  Tomorrow morning,   R        04/30/21 0316   04/30/21 0030  Urinalysis, Routine w reflex microscopic  Once,   STAT        04/30/21 0029              DVT prophylaxis: Place and maintain sequential compression device Start: 04/29/21 2350   Code Status: Full Family Communication: Patient Disposition:   Status is: Inpatient   Dispo: The patient is from: Home  Anticipated d/c is to: Home               Anticipated d/c date is: To be determined, need dialysis                Consultants:  Nephrology  Procedures:  Dialysis  Antimicrobials:   Anti-infectives (From admission, onward)    None           Objective: Vitals:   04/29/21 1900 04/29/21 2130 04/30/21 0504 04/30/21 0806  BP: 115/79  (!) 151/92 (!) 154/98  Pulse: (!) 55 (!) 103 (!) 112 90  Resp:   20 20  Temp:   (!) 97.5 F (36.4 C)   TempSrc:   Oral   SpO2: 98% 99% 100% 100%  Weight:      Height:       No intake or output data in the 24 hours ending 04/30/21 1051 Filed Weights   04/29/21 1024  Weight: 66.7 kg    Examination:  General exam: calm, NAD Respiratory system: Clear to auscultation.  Does not use accessory muscle, able to talk in full sentences Cardiovascular system: S1 & S2  heard, RRR. No JVD, no murmur, No pedal edema. Gastrointestinal system: Abdomen is nondistended, soft and nontender.  Normal bowel sounds heard. Central nervous system: Alert and oriented. No focal neurological deficits. Extremities: left lower extremity edema Skin: No rashes, lesions or ulcers Psychiatry: Judgement and insight appear normal. Mood & affect appropriate.     Data Reviewed: I have personally reviewed following labs and imaging studies  CBC: Recent Labs  Lab 04/26/21 0430 04/29/21 1030  WBC 6.5 16.8*  NEUTROABS 5.3 15.2*  HGB 7.9* 7.5*  HCT 24.6* 23.7*  MCV 99.2 99.2  PLT 111* 93*    Basic Metabolic Panel: Recent Labs  Lab 04/26/21 0430 04/29/21 1030 04/30/21 0512  NA 139 140 137  K 4.8 4.1 4.6  CL 100 98 98  CO2 20* 24 16*  GLUCOSE 95 84 94  BUN 106* 89* 99*  CREATININE 18.91* 16.64* 16.89*  CALCIUM 8.4* 8.9 8.3*  MG  --   --  2.4  PHOS  --   --  8.5*    GFR: Estimated Creatinine Clearance: 4.2 mL/min (A) (by C-G formula based on SCr of 16.89 mg/dL (H)).  Liver Function Tests: Recent Labs  Lab 04/26/21 0430 04/29/21 1030 04/30/21 0512  AST '27 21 24  '$ ALT '14 14 13  '$ ALKPHOS 90 98 118  BILITOT 1.1 1.2 0.9  PROT 6.8 7.2 6.7  ALBUMIN 2.9* 2.8* 2.8*    CBG: No results for input(s): GLUCAP in the last 168 hours.   Recent Results (from the past 240 hour(s))  Resp Panel by RT-PCR (Flu A&B, Covid) Nasopharyngeal Swab     Status: Abnormal   Collection Time: 04/29/21  8:03 PM   Specimen: Nasopharyngeal Swab; Nasopharyngeal(NP) swabs in vial transport medium  Result Value Ref Range Status   SARS Coronavirus 2 by RT PCR POSITIVE (A) NEGATIVE Final    Comment: RESULT CALLED TO, READ BACK BY AND VERIFIED WITH: J FERRAINOL,RN'@2120'$  04/29/21 [place] (NOTE) SARS-CoV-2 target nucleic acids are DETECTED.  The SARS-CoV-2 RNA is generally detectable in upper respiratory specimens during the acute phase of infection. Positive results are indicative of the  presence of the identified virus, but do not rule out bacterial infection or co-infection with other pathogens not detected by the test. Clinical correlation with patient history and other diagnostic information is necessary to determine patient infection status. The  expected result is Negative.  Fact Sheet for Patients: EntrepreneurPulse.com.au  Fact Sheet for Healthcare Providers: IncredibleEmployment.be  This test is not yet approved or cleared by the [place] FDA and  has been authorized for detection and/or diagnosis of SARS-CoV-2 by FDA under an Emergency Use Authorization (EUA).  This EUA will remain in effect (meaning this test can be u sed) for the duration of  the COVID-19 declaration under Section 564(b)(1) of the Act, 21 U.S.C. section 360bbb-3(b)(1), unless the authorization is terminated or revoked sooner.     Influenza A by PCR NEGATIVE NEGATIVE Final   Influenza B by PCR NEGATIVE NEGATIVE Final    Comment: (NOTE) The Xpert Xpress SARS-CoV-2/FLU/RSV plus assay is intended as an aid in the diagnosis of influenza from Nasopharyngeal swab specimens and should not be used as a sole basis for treatment. Nasal washings and aspirates are unacceptable for Xpert Xpress SARS-CoV-2/FLU/RSV testing.  Fact Sheet for Patients: EntrepreneurPulse.com.au  Fact Sheet for Healthcare Providers: IncredibleEmployment.be  This test is not yet approved or cleared by the [place] FDA and has been authorized for detection and/or diagnosis of SARS-CoV-2 by FDA under an Emergency Use Authorization (EUA). This EUA will remain in effect (meaning this test can be used) for the duration of the COVID-19 declaration under Section 564(b)(1) of the Act, 21 U.S.C. section 360bbb-3(b)(1), unless the authorization is terminated or revoked.  Performed at [facility], [place] [address], [place], [place] [address]    Blood Culture (routine x 2)     Status: None (Preliminary result)   Collection Time: 04/29/21 10:40 PM   Specimen: BLOOD RIGHT FOREARM  Result Value Ref Range Status   Specimen Description BLOOD RIGHT FOREARM  Final   Special Requests   Final    BOTTLES DRAWN AEROBIC AND ANAEROBIC Blood Culture results may not be optimal due to an inadequate volume of blood received in culture bottles   Culture   Final    NO GROWTH < 12 HOURS Performed at [facility], [place] [address], [place], [place] [address]    Report Status PENDING  Incomplete  Blood Culture (routine x 2)     Status: None (Preliminary result)   Collection Time: 04/29/21 10:48 PM   Specimen: BLOOD RIGHT HAND  Result Value Ref Range Status   Specimen Description BLOOD RIGHT HAND  Final   Special Requests   Final    BOTTLES DRAWN AEROBIC AND ANAEROBIC Blood Culture results may not be optimal due to an inadequate volume of blood received in culture bottles   Culture   Final    NO GROWTH < 12 HOURS Performed at [facility], [place] [address] [place], [place] [address]    Report Status PENDING  Incomplete         Radiology Studies: DG Chest 2 View  Result Date: 04/29/2021 CLINICAL DATA:  Shortness of breath EXAM: CHEST - 2 VIEW COMPARISON:  Chest x-ray dated April 26, 2021 FINDINGS: Cardiomegaly, apparent decreased size of the cardiac contours is likely related to differences in technique. Unchanged bilateral interstitial opacities. More focal mild bibasilar opacities are seen likely due to atelectasis. No large pleural effusion or evidence of pneumothorax. IMPRESSION: Unchanged pulmonary edema. Cardiomegaly. Electronically Signed   By: Yetta Glassman MD   On: 04/29/2021 10:53        Scheduled Meds:  calcium acetate  1,334 mg Oral TID WC   cinacalcet  30 mg Oral Q breakfast   hydrALAZINE  50 mg Oral TID   nicotine  21 mg Transdermal Daily   sodium chloride flush  3 mL Intravenous Q12H   sodium chloride flush   3 mL Intravenous Q12H   thiamine  100 mg Oral Daily   Continuous Infusions:  sodium chloride     sodium chloride     sodium chloride     famotidine (PEPCID) IV       LOS: 1 day   Time spent: 69mns Greater than 50% of this time was spent in counseling, explanation of diagnosis, planning of further management, and coordination of care.   Voice Recognition /Viviann Sparedictation system was used to create this note, attempts have been made to correct errors. Please contact the author with questions and/or clarifications.   FFlorencia Reasons MD PhD FACP [facility]  Available via Epic secure chat 7am-7pm for nonurgent issues Please page for urgent issues To page the attending provider between 7A-7P or the covering provider during after hours 7P-7A, please log into the web site [email or web address] and access using universal  password for that web site. If you do not have the password, please call the hospital operator.    04/30/2021, 10:51 AM

## 2021-04-30 NOTE — Progress Notes (Addendum)
Mr. Alberta was brought to the [facility] HD room on 5th floor for 3.5 hour tx. Portable RO machines and HD machine not cleaning water safely.  After multiple attempts to correct issue, pt transported back to sending unit. HD unit and 77M notified.  NO TREATMENT COMPLETED

## 2021-04-30 NOTE — Consult Note (Signed)
Renal Service Consult Note [facility]  Jordan [name] 04/30/2021 Sol Blazing, MD Requesting Physician: Dr Erlinda Hong  Reason for Consult: ESRD pt w/ SOB HPI: The patient is a 55 y.o. year-old w/ hx of ESRD on HD, HTN, COPD, PAF presented 04/29/21 with gen malaise and itching, missed HD Monday, last HD Friday. No Etoh for months. CXR showed pulm edema.  Asked to see for dialysis.   Pt seen in ED room.  Standing up in the room.  Main c/o is swollen stomach, mild SOB. No CP.      ROS - denies CP, no joint pain, no HA, no blurry vision, no rash, no diarrhea, no nausea/ vomiting, no dysuria, no difficulty voiding   Past Medical History  Past Medical History:  Diagnosis Date   Anemia    Aortic atherosclerosis ([place]) 11/12/2019   CKD (chronic kidney disease)    Stage 5  Dialysis - M/W/F in [place], [place]   Dyspnea    tx with inhaler when sick   ED (erectile dysfunction)    Emphysema of lung ([place]) 11/12/2019   ETOH abuse    History of blood transfusion    Hypertension    Wears dentures    Past Surgical History  Past Surgical History:  Procedure Laterality Date   BASCILIC VEIN TRANSPOSITION Left 08/07/2016   Procedure: LEFT BASILIC VEIN TRANSPOSITION;  Surgeon: Angelia Mould, MD;  Location: [facility];  Service: Vascular;  Laterality: Left;   CLOSED REDUCTION NASAL FRACTURE N/A 08/11/2019   Procedure: CLOSED REDUCTION NASAL FRACTURE WITH STABILIZATION;  Surgeon: Irene Limbo, MD;  Location: [facility];  Service: Plastics;  Laterality: N/A;   COLONOSCOPY N/A 08/12/2016   Procedure: COLONOSCOPY;  Surgeon: Otis Brace, MD;  Location: [facility];  Service: Gastroenterology;  Laterality: N/A;   ESOPHAGOGASTRODUODENOSCOPY N/A 08/12/2016   Procedure: ESOPHAGOGASTRODUODENOSCOPY (EGD);  Surgeon: Otis Brace, MD;  Location: [facility];  Service: Gastroenterology;  Laterality: N/A;   INSERTION OF DIALYSIS CATHETER N/A 08/07/2016   Procedure: INSERTION OF TUNNELED  DIALYSIS CATHETER;  Surgeon: Angelia Mould, MD;  Location: [facility];  Service: Vascular;  Laterality: N/A;   LIGATION OF ARTERIOVENOUS  FISTULA Left 09/12/2016   Procedure: BANDING OF LEFT  ARTERIOVENOUS  FISTULA;  Surgeon: Angelia Mould, MD;  Location: [facility];  Service: Vascular;  Laterality: Left;   LOWER EXTREMITY INTERVENTION Right 12/02/2018   Procedure: LOWER EXTREMITY INTERVENTION;  Surgeon: Algernon Huxley, MD;  Location: [facility];  Service: Cardiovascular;  Laterality: Right;   Family History  Family History  Problem Relation Age of Onset   Diabetes Mother    Kidney failure Mother    Healthy Father    Kidney failure Brother    Healthy Sister    Kidney disease Daughter    Post-traumatic stress disorder Neg Hx    Bladder Cancer Neg Hx    Kidney cancer Neg Hx    Social History  reports that he has been smoking cigarettes. He has a 20.00 pack-year smoking history. He has never used smokeless tobacco. He reports current alcohol use of about 21.0 standard drinks of alcohol per week. He reports that he does not use drugs. Allergies  Allergies  Allergen Reactions   Lisinopril Swelling   Home medications Prior to Admission medications   Medication Sig Start Date End Date Taking? Authorizing Provider  ALPRAZolam (XANAX) 0.25 MG tablet Take 0.25 mg by mouth 2 (two) times daily as needed for anxiety (anxiety).    [provider]  AURYXIA 1 GM 210 MG(Fe) tablet Take 420 mg by mouth every Monday, Wednesday, and Friday with hemodialysis. 11/13/20   [provider]  calcium acetate (PHOSLO) 667 MG capsule Take 2 capsules (1,334 mg total) by mouth 3 (three) times daily with meals. 03/11/21   Lorella Nimrod, MD  cinacalcet (SENSIPAR) 60 MG tablet SMARTSIG:2 Tablet(s) By Mouth Every Evening 03/18/21   [provider]  epoetin alfa (EPOGEN) 4000 UNIT/ML injection Inject 1 mL (4,000 Units total) into the vein every Monday, Wednesday, and Friday with  hemodialysis. 06/22/20   Enzo Bi, MD  fluticasone (FLONASE) 50 MCG/ACT nasal spray Place 1 spray into both nostrils daily for 15 days. 03/27/21 04/26/21  Arrien, Jimmy Picket, MD  hydrALAZINE (APRESOLINE) 50 MG tablet Take 50 mg by mouth 3 (three) times daily.    [provider]  hydrOXYzine (ATARAX/VISTARIL) 25 MG tablet Take 25 mg by mouth 3 (three) times daily.    [provider]  hydrOXYzine (VISTARIL) 50 MG capsule Take 1 capsule (50 mg total) by mouth 3 (three) times daily as needed. 02/26/21   Cuthriell, Charline Bills, PA-C  Lidocaine 3 % CREA Apply 1 application topically 2 (two) times daily as needed (pain). 02/26/21   Cuthriell, Charline Bills, PA-C  thiamine 100 MG tablet Take 1 tablet (100 mg total) by mouth daily. 03/11/21   Lorella Nimrod, MD  triamcinolone cream (KENALOG) 0.1 % Apply 1 application topically 2 (two) times daily. 04/09/21   [provider]  VELPHORO 500 MG chewable tablet Chew 500 mg by mouth 3 (three) times daily. 03/15/21   [provider]  witch hazel-glycerin (TUCKS) pad Apply topically as needed for itching, hemorrhoids or irritation. Patient not taking: No sig reported 03/26/21   Arrien, Jimmy Picket, MD     Vitals:   04/29/21 2130 04/30/21 0504 04/30/21 0806 04/30/21 1133  BP:  (!) 151/92 (!) 154/98 (!) 148/74  Pulse: (!) 103 (!) 112 90 92  Resp:  '20 20 20  '$ Temp:  (!) 97.5 F (36.4 C)  97.8 F (36.6 C)  TempSrc:  Oral  Oral  SpO2: 99% 100% 100% 100%  Weight:      Height:       Exam Gen alert, no distress No rash, cyanosis or gangrene Sclera anicteric, throat clear  No jvd or bruits Chest clear bilat to bases, no rales/ wheezing RRR no MRG Abd soft ntnd no mass, + possible ascites 2+ GU normal MS no joint effusions or deformity Ext 1+ LLE edema, no RLE edema, no wounds or ulcers Neuro is alert, Ox 3 , nf   LFA AVF+bruit         Home meds include xanax 0.25 bid, auryxia ac tid 2, phoslo 2 ac tid, sensipar 30 qam,  hydralazine 50 tid, thiamine, velphoro 500 tid ac     OP HD: MWF [facility]  4h  450/800   dry wt pend  2/2 bath  LUE AVF   Hep none  - pending   Assessment/ Plan: SOB/ pulm edema - looks like vol overload.  Not in distress.  ESRD - on HD  MWF.  Missed HD yesterday. Plan HD soon as we have staff available.  COVID 19 infection - mild URI sx's, given 1 dose of bebtelovimab. On home O2 w/o new O2 requirement. CXR+ edema.  PAF - per pmd HTN - resume meds Volume - as above Anemia ckd - Hb 7's here, no need tranfusion yet.  H/o etoh  abuse - in remission x several mos COPD - on home O2 possibly       Rob Cloa Bushong  MD 04/30/2021, 12:47 PM  Recent Labs  Lab 04/26/21 0430 04/29/21 1030  WBC 6.5 16.8*  HGB 7.9* 7.5*   Recent Labs  Lab 04/29/21 1030 04/30/21 0512  K 4.1 4.6  BUN 89* 99*  CREATININE 16.64* 16.89*  CALCIUM 8.9 8.3*  PHOS  --  8.5*

## 2021-05-01 ENCOUNTER — Encounter: Payer: Medicare Other | Admitting: Internal Medicine

## 2021-05-01 ENCOUNTER — Inpatient Hospital Stay (HOSPITAL_COMMUNITY): Payer: Medicare Other

## 2021-05-01 DIAGNOSIS — U071 COVID-19: Secondary | ICD-10-CM

## 2021-05-01 DIAGNOSIS — R609 Edema, unspecified: Secondary | ICD-10-CM | POA: Diagnosis not present

## 2021-05-01 DIAGNOSIS — I5043 Acute on chronic combined systolic (congestive) and diastolic (congestive) heart failure: Secondary | ICD-10-CM

## 2021-05-01 DIAGNOSIS — Z992 Dependence on renal dialysis: Secondary | ICD-10-CM

## 2021-05-01 LAB — CBC WITH DIFFERENTIAL/PLATELET
Abs Immature Granulocytes: 0.24 10*3/uL — ABNORMAL HIGH (ref 0.00–0.07)
Basophils Absolute: 0 10*3/uL (ref 0.0–0.1)
Basophils Relative: 0 %
Eosinophils Absolute: 0.2 10*3/uL (ref 0.0–0.5)
Eosinophils Relative: 2 %
HCT: 23.6 % — ABNORMAL LOW (ref 39.0–52.0)
Hemoglobin: 7.5 g/dL — ABNORMAL LOW (ref 13.0–17.0)
Immature Granulocytes: 3 %
Lymphocytes Relative: 6 %
Lymphs Abs: 0.6 10*3/uL — ABNORMAL LOW (ref 0.7–4.0)
MCH: 31.5 pg (ref 26.0–34.0)
MCHC: 31.8 g/dL (ref 30.0–36.0)
MCV: 99.2 fL (ref 80.0–100.0)
Monocytes Absolute: 0.7 10*3/uL (ref 0.1–1.0)
Monocytes Relative: 8 %
Neutro Abs: 7.2 10*3/uL (ref 1.7–7.7)
Neutrophils Relative %: 81 %
Platelets: 93 10*3/uL — ABNORMAL LOW (ref 150–400)
RBC: 2.38 MIL/uL — ABNORMAL LOW (ref 4.22–5.81)
RDW: 15.4 % (ref 11.5–15.5)
WBC: 8.9 10*3/uL (ref 4.0–10.5)
nRBC: 0 % (ref 0.0–0.2)

## 2021-05-01 LAB — COMPREHENSIVE METABOLIC PANEL
ALT: 16 U/L (ref 0–44)
AST: 23 U/L (ref 15–41)
Albumin: 3 g/dL — ABNORMAL LOW (ref 3.5–5.0)
Alkaline Phosphatase: 116 U/L (ref 38–126)
Anion gap: 20 — ABNORMAL HIGH (ref 5–15)
BUN: 103 mg/dL — ABNORMAL HIGH (ref 6–20)
CO2: 20 mmol/L — ABNORMAL LOW (ref 22–32)
Calcium: 8.2 mg/dL — ABNORMAL LOW (ref 8.9–10.3)
Chloride: 96 mmol/L — ABNORMAL LOW (ref 98–111)
Creatinine, Ser: 17.3 mg/dL — ABNORMAL HIGH (ref 0.61–1.24)
GFR, Estimated: 3 mL/min — ABNORMAL LOW (ref >60)
Glucose, Bld: 89 mg/dL (ref 70–99)
Potassium: 4.5 mmol/L (ref 3.5–5.1)
Sodium: 136 mmol/L (ref 135–145)
Total Bilirubin: 1 mg/dL (ref 0.3–1.2)
Total Protein: 7.3 g/dL (ref 6.5–8.1)

## 2021-05-01 LAB — FERRITIN: Ferritin: 989 ng/mL — ABNORMAL HIGH (ref 24–336)

## 2021-05-01 LAB — PHOSPHORUS: Phosphorus: 7.8 mg/dL — ABNORMAL HIGH (ref 2.5–4.6)

## 2021-05-01 LAB — D-DIMER, QUANTITATIVE: D-Dimer, Quant: 5.62 ug/mL-FEU — ABNORMAL HIGH (ref 0.00–0.50)

## 2021-05-01 LAB — MAGNESIUM: Magnesium: 2.4 mg/dL (ref 1.7–2.4)

## 2021-05-01 NOTE — Progress Notes (Signed)
Lower extremity venous LT study completed.   Please see CV Proc for preliminary results.   Kason Benak, RDMS, RVT  

## 2021-05-01 NOTE — Progress Notes (Signed)
Patient removing telemetry leads

## 2021-05-01 NOTE — Progress Notes (Signed)
Occupational Therapy Evaluation Patient Details Name: Jordan [name] MRN: [id number] DOB: 1966/03/13 Today's Date: 05/01/2021    History of Present Illness 55yo male admitted 04/29/21 with malaise, missed HD. Found to have pulmonary edema and acute on chronic CHF due to missed HD session, also incidentally covid +. PMH ESRD on HD, COPD, EtOH abuse, HTN, CHF, A-fib, tobacco abuse   Clinical Impression   Avion was evaluated s/p the above impairments. Upon arrival he was resting and required gentle encouragement to participate. PTA pt was indep in all ADL/IADLs however he reports feeling sick lately and just staying home. He lives alone in a one level home with 4 STE. Upon eval pt is mod I or I with all ADLs and room mobility. He reported that his main problem is his "itchy bottom" not getting rest, and going to the bathroom "every 3 minutes. Pt does not required skilled OT. Recommend d/c home with intermittent supervision for safety.      Follow Up Recommendations  No OT follow up;Supervision - Intermittent    Equipment Recommendations  None recommended by OT       Precautions / Restrictions Precautions Precautions: Other (comment) Precaution Comments: covid + Restrictions Weight Bearing Restrictions: No      Mobility Bed Mobility Overal bed mobility: Independent        Transfers Overall transfer level: Independent Equipment used: None        ADL either performed or assessed with clinical judgement   ADL Overall ADL's : Modified independent;At baseline           General ADL Comments: pt was able to put bilat socks on, groom his face while sitting EOB and ambulate to the bathroom with supervision A. Pt is likely at his baseline, with his main limiting factor being fatigue and an itchy bottom.     Vision Baseline Vision/History: No visual deficits Patient Visual Report: No change from baseline        Pertinent Vitals/Pain Pain Assessment: Faces Faces Pain Scale:  Hurts a little bit Pain Location: "spots I have been itching and around my rear" Pain Descriptors / Indicators: Discomfort Pain Intervention(s): Monitored during session     Hand Dominance Right   Extremity/Trunk Assessment Upper Extremity Assessment Upper Extremity Assessment: Generalized weakness (pt initially refused BUE ROM and MMT because he was "just too tired" however with gentle encouragement pt demonstrated WFL of ROM with generalized weakness)   Lower Extremity Assessment Lower Extremity Assessment: Defer to PT evaluation   Cervical / Trunk Assessment Cervical / Trunk Assessment: Normal   Communication Communication Communication: No difficulties   Cognition Arousal/Alertness: Awake/alert Behavior During Therapy: WFL for tasks assessed/performed;Flat affect Overall Cognitive Status: Within Functional Limits for tasks assessed         General Comments: pt agitated that "people" keep coming in and waking him up becasue he has not been sleeping at night due to getting up "every three minutes" to have "liquid" BM   General Comments  SpO2 between 96-98% with 2L nasal canula     Home Living Family/patient expects to be discharged to:: Private residence Living Arrangements: Alone   Type of Home: House Home Access: Stairs to enter [employer] of Steps: 4 no rails   Home Layout: One level     Bathroom Shower/Tub: Teacher, early years/pre: Standard     Home Equipment: Shower seat          Prior Functioning/Environment Level of Independence: Independent  Comments: has been pretty sick the past couple weeks, usually works as Engineer, agricultural Problem List: Decreased activity tolerance;Decreased safety awareness;Pain         OT Goals(Current goals can be found in the care plan section) Acute Rehab OT Goals Patient Stated Goal: feel better OT Goal Formulation: All assessment and education complete, DC  therapy   AM-PAC OT "6 Clicks" Daily Activity     Outcome Measure Help from another person eating meals?: None Help from another person taking care of personal grooming?: None Help from another person toileting, which includes using toliet, bedpan, or urinal?: None Help from another person bathing (including washing, rinsing, drying)?: None Help from another person to put on and taking off regular upper body clothing?: None Help from another person to put on and taking off regular lower body clothing?: None 6 Click Score: 24   End of Session Equipment Utilized During Treatment: Gait belt;Oxygen Nurse Communication: Mobility status;Precautions;Weight bearing status  Activity Tolerance: Patient tolerated treatment well;Treatment limited secondary to agitation Patient left: in chair;with call bell/phone within reach;with chair alarm set  OT Visit Diagnosis: Pain                Time: [id number] OT Time Calculation (min): 22 min Charges:  OT General Charges $OT Visit: 1 Visit OT Evaluation $OT Eval Low Complexity: 1 Low   Carmelita Amparo A Garren Greenman 05/01/2021, 10:37 AM

## 2021-05-01 NOTE — Progress Notes (Signed)
Patient ID: Jordan [name], male   DOB: Jun 04, 1966, 55 y.o.   MRN: [id number]  PROGRESS NOTE    ADEN GARY  [id number] DOB: 03-17-1966 DOA: 04/29/2021 PCP: Pcp, No   Brief Narrative:  55 year old male with history of end-stage renal disease on hemodialysis, COPD, alcohol abuse, anemia, hypertension, chronic diastolic CHF, tobacco abuse, A. fib presented with not feeling well, itching and not being able to afford Atarax and subsequently missed hemodialysis.  On presentation, he was found to be COVID-positive and was treated with monoclonal antibody.  He was found to have pulmonary edema.  Nephrology was consulted.  Assessment & Plan:   End-stage renal disease on hemodialysis Pulmonary edema Acute on chronic combined systolic and diastolic CHF -Patient presented with volume overload after missing his hemodialysis.  Respiratory status currently stable. -Nephrology following and planning for hemodialysis today.  If respiratory status improves after dialysis, patient might be able to be discharged home today.  COVID-19 infection Chronic hypoxic respiratory failure/COPD -Probably incidental finding.  Patient is vaccinated and basted.  Respiratory status stable and currently on 2 L oxygen by nasal cannula which is his normal home requirement -Inflammatory markers are elevated but doubt that patient needs further treatment for COVID-19.  Received a dose of monoclonal antibody on presentation. -COVID-19 Labs  Recent Labs    04/29/21 2159 04/30/21 0512 05/01/21 0338  DDIMER 3.15* 5.89* 5.62*  FERRITIN 939* 857* 989*  LDH 310* 371*  --   CRP 17.0* 15.6*  --     Lab Results  Component Value Date   SARSCOV2NAA POSITIVE (A) 04/29/2021   SARSCOV2NAA NEGATIVE 04/08/2021   [place] NEGATIVE 03/28/2021   [place] NEGATIVE 03/25/2021   -Continue isolation  Leukocytosis -Resolved  Thrombocytopenia -Chronic.  Stable.  No signs of bleeding  Anemia of chronic  disease -Hemoglobin stable.  Transfuse if hemoglobin is less than 7  Paroxysmal A. fib -Currently rate controlled.  Not on anticoagulation as an outpatient.  Essential hypertension -Blood pressure intermittently elevated.  Continue hydralazine  Alcohol abuse In remission  Pruritus -Chronic.  Continue Atarax  Chronic tobacco use--continue nicotine patch    DVT prophylaxis: SCDs Code Status: Full Family Communication: None at bedside Disposition Plan: Status is: Inpatient  Remains inpatient appropriate because:Inpatient level of care appropriate due to severity of illness  Dispo: The patient is from: Home              Anticipated d/c is to: Home              Patient currently is not medically stable to d/c.   Difficult to place patient No  Consultants: Nephrology  Procedures: None  Antimicrobials: None   Subjective: Patient seen and examined at bedside. Feels okay.  Denies worsening cough or shortness of breath.  No overnight fever or vomiting reported.  Objective: Vitals:   04/30/21 1531 04/30/21 1630 04/30/21 2023 05/01/21 0239  BP: (!) 143/96 (!) 160/77 (!) 164/121 140/87  Pulse: 100  100 95  Resp: '20 12 20 18  '$ Temp: (!) 97.4 F (36.3 C) (!) 96 F (35.6 C) 97.9 F (36.6 C) 97.6 F (36.4 C)  TempSrc: Oral Axillary Oral Oral  SpO2: 100% 100% 100% (!) 86%  Weight:  77 kg    Height:        Intake/Output Summary (Last 24 hours) at 05/01/2021 1423 Last data filed at 05/01/2021 0815 Gross per 24 hour  Intake 240 ml  Output --  Net 240 ml   [employer]  04/29/21 1024 04/30/21 1630  Weight: 66.7 kg 77 kg    Examination:  General exam: Appears calm and comfortable.  Looks chronically ill and deconditioned.  Currently on 2 L oxygen by nasal cannula Respiratory system: Bilateral decreased breath sounds at bases with scattered crackles Cardiovascular system: S1 & S2 heard, Rate controlled Gastrointestinal system: Abdomen is nondistended, soft and  nontender. Normal bowel sounds heard. Extremities: No cyanosis, clubbing; trace lower extremity edema present Central nervous system: Alert and oriented.  Slow to respond.  Poor historian.  No focal neurological deficits. Moving extremities Skin: No rashes, lesions or ulcers Psychiatry: Affect is mostly flat.  Does not participate in conversation much.   Data Reviewed: I have personally reviewed following labs and imaging studies  CBC: Recent Labs  Lab 04/26/21 0430 04/29/21 1030 05/01/21 0338  WBC 6.5 16.8* 8.9  NEUTROABS 5.3 15.2* 7.2  HGB 7.9* 7.5* 7.5*  HCT 24.6* 23.7* 23.6*  MCV 99.2 99.2 99.2  PLT 111* 93* 93*   Basic Metabolic Panel: Recent Labs  Lab 04/26/21 0430 04/29/21 1030 04/30/21 0512 05/01/21 0338  NA 139 140 137 136  K 4.8 4.1 4.6 4.5  CL 100 98 98 96*  CO2 20* 24 16* 20*  GLUCOSE 95 84 94 89  BUN 106* 89* 99* 103*  CREATININE 18.91* 16.64* 16.89* 17.30*  CALCIUM 8.4* 8.9 8.3* 8.2*  MG  --   --  2.4 2.4  PHOS  --   --  8.5* 7.8*   GFR: Estimated Creatinine Clearance: 4.6 mL/min (A) (by C-G formula based on SCr of 17.3 mg/dL (H)). Liver Function Tests: Recent Labs  Lab 04/26/21 0430 04/29/21 1030 04/30/21 0512 05/01/21 0338  AST '27 21 24 23  '$ ALT '14 14 13 16  '$ ALKPHOS 90 98 118 116  BILITOT 1.1 1.2 0.9 1.0  PROT 6.8 7.2 6.7 7.3  ALBUMIN 2.9* 2.8* 2.8* 3.0*   No results for input(s): LIPASE, AMYLASE in the last 168 hours. No results for input(s): AMMONIA in the last 168 hours. Coagulation Profile: No results for input(s): INR, PROTIME in the last 168 hours. Cardiac Enzymes: No results for input(s): CKTOTAL, CKMB, CKMBINDEX, TROPONINI in the last 168 hours. BNP (last 3 results) No results for input(s): PROBNP in the last 8760 hours. HbA1C: No results for input(s): HGBA1C in the last 72 hours. CBG: No results for input(s): GLUCAP in the last 168 hours. Lipid Profile: Recent Labs    04/29/21 2159  TRIG 105   Thyroid Function  Tests: Recent Labs    04/30/21 0512  TSH 1.016   Anemia Panel: Recent Labs    04/30/21 0512 05/01/21 0338  FERRITIN 857* 989*   Sepsis Labs: Recent Labs  Lab 04/29/21 2159 04/30/21 0512  PROCALCITON 21.91  --   LATICACIDVEN 2.5* 2.2*    Recent Results (from the past 240 hour(s))  Resp Panel by RT-PCR (Flu A&B, Covid) Nasopharyngeal Swab     Status: Abnormal   Collection Time: 04/29/21  8:03 PM   Specimen: Nasopharyngeal Swab; Nasopharyngeal(NP) swabs in vial transport medium  Result Value Ref Range Status   SARS Coronavirus 2 by RT PCR POSITIVE (A) NEGATIVE Final    Comment: RESULT CALLED TO, READ BACK BY AND VERIFIED WITH: J FERRAINOL,RN'@2120'$  04/29/21 [place] (NOTE) SARS-CoV-2 target nucleic acids are DETECTED.  The SARS-CoV-2 RNA is generally detectable in upper respiratory specimens during the acute phase of infection. Positive results are indicative of the presence of the identified virus, but do not rule out bacterial  infection or co-infection with other pathogens not detected by the test. Clinical correlation with patient history and other diagnostic information is necessary to determine patient infection status. The expected result is Negative.  Fact Sheet for Patients: EntrepreneurPulse.com.au  Fact Sheet for Healthcare Providers: IncredibleEmployment.be  This test is not yet approved or cleared by the [place] FDA and  has been authorized for detection and/or diagnosis of SARS-CoV-2 by FDA under an Emergency Use Authorization (EUA).  This EUA will remain in effect (meaning this test can be u sed) for the duration of  the COVID-19 declaration under Section 564(b)(1) of the Act, 21 U.S.C. section 360bbb-3(b)(1), unless the authorization is terminated or revoked sooner.     Influenza A by PCR NEGATIVE NEGATIVE Final   Influenza B by PCR NEGATIVE NEGATIVE Final    Comment: (NOTE) The Xpert Xpress SARS-CoV-2/FLU/RSV  plus assay is intended as an aid in the diagnosis of influenza from Nasopharyngeal swab specimens and should not be used as a sole basis for treatment. Nasal washings and aspirates are unacceptable for Xpert Xpress SARS-CoV-2/FLU/RSV testing.  Fact Sheet for Patients: EntrepreneurPulse.com.au  Fact Sheet for Healthcare Providers: IncredibleEmployment.be  This test is not yet approved or cleared by the [place] FDA and has been authorized for detection and/or diagnosis of SARS-CoV-2 by FDA under an Emergency Use Authorization (EUA). This EUA will remain in effect (meaning this test can be used) for the duration of the COVID-19 declaration under Section 564(b)(1) of the Act, 21 U.S.C. section 360bbb-3(b)(1), unless the authorization is terminated or revoked.  Performed at [facility], [place] [address] [place], [place] [address]   Blood Culture (routine x 2)     Status: None (Preliminary result)   Collection Time: 04/29/21 10:40 PM   Specimen: BLOOD RIGHT FOREARM  Result Value Ref Range Status   Specimen Description BLOOD RIGHT FOREARM  Final   Special Requests   Final    BOTTLES DRAWN AEROBIC AND ANAEROBIC Blood Culture results may not be optimal due to an inadequate volume of blood received in culture bottles   Culture   Final    NO GROWTH 2 DAYS Performed at [facility], [place] [address], [place], [place] [address]    Report Status PENDING  Incomplete  Blood Culture (routine x 2)     Status: None (Preliminary result)   Collection Time: 04/29/21 10:48 PM   Specimen: BLOOD RIGHT HAND  Result Value Ref Range Status   Specimen Description BLOOD RIGHT HAND  Final   Special Requests   Final    BOTTLES DRAWN AEROBIC AND ANAEROBIC Blood Culture results may not be optimal due to an inadequate volume of blood received in culture bottles   Culture   Final    NO GROWTH 2 DAYS Performed at [facility], [place] [address],  [place],  [address]    Report Status PENDING  Incomplete         Radiology Studies: No results found.      Scheduled Meds:  calcium acetate  1,334 mg Oral TID WC   Chlorhexidine Gluconate Cloth  6 each Topical Q0600   cinacalcet  30 mg Oral Q breakfast   guaiFENesin  600 mg Oral BID   hydrALAZINE  50 mg Oral TID   nicotine  21 mg Transdermal Daily   sodium chloride flush  3 mL Intravenous Q12H   sodium chloride flush  3 mL Intravenous Q12H   thiamine  100 mg Oral  Daily   Continuous Infusions:  sodium chloride     sodium chloride            Aline August, MD [facility] 05/01/2021, 2:23 PM

## 2021-05-01 NOTE — Progress Notes (Signed)
Nutrition Brief Note  RD consulted for "nutritional goals."  Admitting Dx: Pruritus [L29.9] Pulmonary edema [J81.1] Weakness [R53.1] COVID [U07.1] PMH:  Past Medical History:  Diagnosis Date   Anemia    Aortic atherosclerosis ([place]) 11/12/2019   CKD (chronic kidney disease)    Stage 5  Dialysis - M/W/F in [place], [place]   Dyspnea    tx with inhaler when sick   ED (erectile dysfunction)    Emphysema of lung ([place]) 11/12/2019   ETOH abuse    History of blood transfusion    Hypertension    Wears dentures    Medications:  Scheduled Meds:  calcium acetate  1,334 mg Oral TID WC   Chlorhexidine Gluconate Cloth  6 each Topical Q0600   cinacalcet  30 mg Oral Q breakfast   guaiFENesin  600 mg Oral BID   hydrALAZINE  50 mg Oral TID   nicotine  21 mg Transdermal Daily   sodium chloride flush  3 mL Intravenous Q12H   sodium chloride flush  3 mL Intravenous Q12H   thiamine  100 mg Oral Daily  Continuous Infusions:  sodium chloride     sodium chloride     Labs: Recent Labs  Lab 04/29/21 1030 04/30/21 0512 05/01/21 0338  NA 140 137 136  K 4.1 4.6 4.5  CL 98 98 96*  CO2 24 16* 20*  BUN 89* 99* 103*  CREATININE 16.64* 16.89* 17.30*  CALCIUM 8.9 8.3* 8.2*  MG  --  2.4 2.4  PHOS  --  8.5* 7.8*  GLUCOSE 84 94 89   Wt Readings from Last 15 Encounters:  04/30/21 77 kg  04/08/21 75.3 kg  03/21/21 76.1 kg  03/10/21 71.2 kg  03/01/21 70.8 kg  02/26/21 71.2 kg  02/11/21 72.6 kg  02/01/21 68 kg  01/12/21 70.3 kg  10/09/20 68 kg  08/21/20 67.1 kg  06/20/20 72.6 kg  06/06/20 (S) 68 kg  06/04/20 77.1 kg  04/11/20 72.6 kg   Body mass index is 29.14 kg/m. Patient meets criteria for overweight based on current BMI.   Current diet order is renal, patient is consuming approximately 100% of meals at this time. Pt denies N/V/D and changes in weight. Mild pitting edema noted to BLE.   No nutrition interventions warranted at this time. If nutrition issues arise, please consult RD.    Larkin Ina, MS, RD, LDN (she/her/hers) RD pager number and weekend/on-call pager number located in [place].

## 2021-05-01 NOTE — Progress Notes (Signed)
Jordan [name] Progress Note  Subjective: did not get HD yest due to equipment problems, will be done this afternoon. No new c/o's per the pt.   Vitals:   04/30/21 1531 04/30/21 1630 04/30/21 2023 05/01/21 0239  BP: (!) 143/96 (!) 160/77 (!) 164/121 140/87  Pulse: 100  100 95  Resp: '20 12 20 18  '$ Temp: (!) 97.4 F (36.3 C) (!) 96 F (35.6 C) 97.9 F (36.6 C) 97.6 F (36.4 C)  TempSrc: Oral Axillary Oral Oral  SpO2: 100% 100% 100% (!) 86%  Weight:  77 kg    Height:        Exam: Gen alert, no distress No rash, cyanosis or gangrene Sclera anicteric, throat clear No jvd or bruits Chest clear bilat to bases, no rales/ wheezing RRR no MRG Abd soft ntnd no mass, + possible ascites 2+ GU normal MS no joint effusions or deformity Ext 1+ LLE edema, no RLE edema, no wounds or ulcers Neuro is alert, Ox 3 , nf   LFA AVF+bruit       Home meds include xanax 0.25 bid, auryxia ac tid 2, phoslo 2 ac tid, sensipar 30 qam, hydralazine 50 tid, thiamine, velphoro 500 tid ac        OP HD: MWF [facility]  4h  450/800  67kg   2/2 bath  LUE AVF   Hep none  - pending     Assessment/ Plan: SOB/ pulm edema - looks like vol overload.  Not in distress. ESRD - on HD  MWF.  Missed HD Monday. Did not get HD here yesterday due to equipment issues. Plan HD today this afternoon.  Chronic vol overload/ chronic noncompliance w/ dialysis - pt signs off early and no-shows for HD regularly. In OP setting he is usually 5-10 kg over his "dry wt". Here he is 8-10 kg up w/ edema on CXR, will need to stay until vol overload is reasonably controlled.  COVID 19 infection - mild URI sx's, sp 1 dose of bebtelovimab. On home O2 w/o new O2 requirement. CXR+ edema. PAF - per pmd HTN - resume meds Anemia ckd - Hb 7's here, no need tranfusion yet. H/o etoh abuse - in remission x several mos COPD - on home O2      Jordan [name] 05/01/2021, 8:23 AM   Recent Labs  Lab 04/29/21 1030 04/30/21 0512  05/01/21 0338  K 4.1 4.6 4.5  BUN 89* 99* 103*  CREATININE 16.64* 16.89* 17.30*  CALCIUM 8.9 8.3* 8.2*  PHOS  --  8.5* 7.8*  HGB 7.5*  --  7.5*   Inpatient medications:  calcium acetate  1,334 mg Oral TID WC   Chlorhexidine Gluconate Cloth  6 each Topical Q0600   cinacalcet  30 mg Oral Q breakfast   guaiFENesin  600 mg Oral BID   hydrALAZINE  50 mg Oral TID   nicotine  21 mg Transdermal Daily   sodium chloride flush  3 mL Intravenous Q12H   sodium chloride flush  3 mL Intravenous Q12H   thiamine  100 mg Oral Daily    sodium chloride     sodium chloride     sodium chloride, sodium chloride, acetaminophen **OR** acetaminophen, albuterol, ALPRAZolam, chlorpheniramine-HYDROcodone, guaiFENesin-dextromethorphan, hydrocortisone, hydrOXYzine, sodium chloride flush, sodium chloride flush

## 2021-05-01 NOTE — Progress Notes (Deleted)
   CC: ***  HPI:Mr.Jordan [name] is a 55 y.o. male who presents for evaluation of ***. Please see individual problem based A/P for details.  Recently seen in ED 8/8 and noted to be fluid overloaded CXR showed pulmonary edema and was dialyzed by nephro.  CHF combined systolic and diastolic  ESRD on HD MWF on phoslo, sensipar, and epogen - K 4.5, mg 2.4, phos 7.8 - anemia with baseline hgb 7.5, ferritin 9989  Afib - chadsvasc score 2, not on anticoag 2/2 fall risk and bleeding  COPD w/ hx of tobacco abuse - nicotine patch helped? - 2L home o2  HTN:  COVID 19 positive Found incidentally in ED. Vaccinated and boosted.  Depression, PHQ-9: Based on the patients  score we have ***.  Past Medical History:  Diagnosis Date   Anemia    Aortic atherosclerosis ([place]) 11/12/2019   CKD (chronic kidney disease)    Stage 5  Dialysis - M/W/F in [place], [place]   Dyspnea    tx with inhaler when sick   ED (erectile dysfunction)    Emphysema of lung ([place]) 11/12/2019   ETOH abuse    History of blood transfusion    Hypertension    Wears dentures    Review of Systems:   ROS   Physical Exam: There were no vitals filed for this visit.   General: *** HEENT: Conjunctiva nl , antiicteric sclerae, moist mucous membranes, no exudate or erythema Cardiovascular: Normal rate, regular rhythm.  No murmurs, rubs, or gallops Pulmonary : Equal breath sounds, No wheezes, rales, or rhonchi Abdominal: soft, nontender,  bowel sounds present Ext: No edema in lower extremities, no tenderness to palpation of lower extremities.   Assessment & Plan:   See Encounters Tab for problem based charting.  Patient {GC/GE:[id number]::"discussed with","seen with"} Dr. QH:[id number]. Hoffman","Guilloud","Mullen","Narendra","Raines","Vincent","Williams"}

## 2021-05-02 NOTE — Progress Notes (Addendum)
[facility] Progress Note  Subjective: 3.2  L off w/ HD yesterday. Pt seen in room, SOB better.    Vitals:   05/01/21 2100 05/01/21 2126 05/01/21 2233 05/02/21 0554  BP: (!) 145/65 (!) 147/82 (!) 158/93 133/70  Pulse: 98 (!) 101 (!) 107 (!) 103  Resp:  '15 18 16  '$ Temp:  (!) 97.5 F (36.4 C) 98.4 F (36.9 C) 98.6 F (37 C)  TempSrc:  Oral  Oral  SpO2:  100% 96% 98%  Weight:  75.3 kg 75.3 kg   Height:        Exam: Gen alert, no distress No jvd or bruits Chest clear bilat to base RRR no MRG Abd soft ntnd no mass, + possible ascites 2+ GU normal MS no joint effusions or deformity Ext 1+ LLE edema, no RLE edema Neuro is alert, Ox 3 , nf   LFA AVF+bruit     Home meds include xanax 0.25 bid, auryxia ac tid 2, phoslo 2 ac tid, sensipar 30 qam, hydralazine 50 tid, thiamine, velphoro 500 tid ac        OP HD: MWF [facility]  4h  450/800  67kg   2/2 bath  LUE AVF   Hep none  - pending     Assessment/ Plan: SOB/ pulm edema - due to vol overload. 3.2 L off yest on HD. See also #3 below. Will get f/u CXR after HD tomorrow.  ESRD - on HD  MWF.  HD yesterday. Next HD tomorrow, cont to lower volume as tolerated.  Chronic vol overload/ chronic noncompliance w/ dialysis - pt signs off early and no-shows for HD regularly. In OP setting he is usually 8-10 kg over his "dry wt". Here on admission he was 8-10 kg+  w/ pulm edema on CXR. Will need to stay until vol overload is reasonably controlled. Had a long discussion w/ pt today about long-term fluid overload and the damage it will cause.  COVID 19 infection - mild URI sx's, sp 1 dose of bebtelovimab. On home O2 w/o new O2 requirement.  PAF - per pmd HTN - resume meds Anemia ckd - Hb 7's here, no need tranfusion yet. H/o etoh abuse - in remission x several mos COPD - on home O2      Rob Osbaldo Mark 05/02/2021, 9:47 AM   Recent Labs  Lab 04/29/21 1030 04/30/21 0512 05/01/21 0338  K 4.1 4.6 4.5  BUN 89* 99*  103*  CREATININE 16.64* 16.89* 17.30*  CALCIUM 8.9 8.3* 8.2*  PHOS  --  8.5* 7.8*  HGB 7.5*  --  7.5*    Inpatient medications:  calcium acetate  1,334 mg Oral TID WC   Chlorhexidine Gluconate Cloth  6 each Topical Q0600   cinacalcet  30 mg Oral Q breakfast   guaiFENesin  600 mg Oral BID   hydrALAZINE  50 mg Oral TID   nicotine  21 mg Transdermal Daily   sodium chloride flush  3 mL Intravenous Q12H   sodium chloride flush  3 mL Intravenous Q12H   thiamine  100 mg Oral Daily    sodium chloride     sodium chloride     sodium chloride, sodium chloride, acetaminophen **OR** acetaminophen, albuterol, ALPRAZolam, chlorpheniramine-HYDROcodone, guaiFENesin-dextromethorphan, hydrocortisone, hydrOXYzine, sodium chloride flush, sodium chloride flush

## 2021-05-02 NOTE — Progress Notes (Signed)
Patient ID: Jordan [name], male   DOB: 15-Apr-1966, 55 y.o.   MRN: [id number]  PROGRESS NOTE    Jordan [name]  [id number] DOB: 1966-08-27 DOA: 04/29/2021 PCP: Pcp, No   Brief Narrative:  55 year old male with history of end-stage renal disease on hemodialysis, COPD, alcohol abuse, anemia, hypertension, chronic diastolic CHF, tobacco abuse, A. fib presented with not feeling well, itching and not being able to afford Atarax and subsequently missed hemodialysis.  On presentation, he was found to be COVID-positive and was treated with monoclonal antibody.  He was found to have pulmonary edema.  Nephrology was consulted.  Assessment & Plan:   End-stage renal disease on hemodialysis Pulmonary edema Acute on chronic combined systolic and diastolic CHF -Patient presented with volume overload after missing his hemodialysis.  Respiratory status currently stable. -Nephrology following and patient had hemodialysis on 05/02/2021.  Will need at least 1 more session of dialysis in the hospital prior to discharge.  Still volume overloaded.  COVID-19 infection Chronic hypoxic respiratory failure/COPD -Probably incidental finding.  Patient is vaccinated and boosted.  Respiratory status stable  -Inflammatory markers are elevated but doubt that patient needs further treatment for COVID-19.  Received a dose of monoclonal antibody on presentation. -COVID-19 Labs  Recent Labs    04/29/21 2159 04/30/21 0512 05/01/21 0338  DDIMER 3.15* 5.89* 5.62*  FERRITIN 939* 857* 989*  LDH 310* 371*  --   CRP 17.0* 15.6*  --      Lab Results  Component Value Date   SARSCOV2NAA POSITIVE (A) 04/29/2021   SARSCOV2NAA NEGATIVE 04/08/2021   [place] NEGATIVE 03/28/2021   [place] NEGATIVE 03/25/2021   -Continue isolation  Leukocytosis -Resolved  Thrombocytopenia -Chronic.  Stable.  No signs of bleeding  Anemia of chronic disease -Hemoglobin stable.  Transfuse if hemoglobin is less than  7  Paroxysmal A. fib -Currently rate controlled.  Not on anticoagulation as an outpatient.  Essential hypertension -Blood pressure intermittently elevated.  Continue hydralazine  Alcohol abuse In remission  Pruritus -Chronic.  Continue Atarax  Chronic tobacco use--continue nicotine patch    DVT prophylaxis: SCDs Code Status: Full Family Communication: None at bedside Disposition Plan: Status is: Inpatient  Remains inpatient appropriate because:Inpatient level of care appropriate due to severity of illness  Dispo: The patient is from: Home              Anticipated d/c is to: Home              Patient currently is not medically stable to d/c.   Difficult to place patient No  Consultants: Nephrology  Procedures: None  Antimicrobials: None   Subjective: Patient seen and examined at bedside.  Poor historian.  Denies worsening cough, overnight fever or vomiting. Objective: Vitals:   05/01/21 2100 05/01/21 2126 05/01/21 2233 05/02/21 0554  BP: (!) 145/65 (!) 147/82 (!) 158/93 133/70  Pulse: 98 (!) 101 (!) 107 (!) 103  Resp:  '15 18 16  '$ Temp:  (!) 97.5 F (36.4 C) 98.4 F (36.9 C) 98.6 F (37 C)  TempSrc:  Oral  Oral  SpO2:  100% 96% 98%  Weight:  75.3 kg 75.3 kg   Height:        Intake/Output Summary (Last 24 hours) at 05/02/2021 0809 Last data filed at 05/02/2021 0600 Gross per 24 hour  Intake 1080 ml  Output 3263 ml  Net -2183 ml    Filed Weights   05/01/21 1820 05/01/21 2126 05/01/21 2233  Weight: 78.8 kg 75.3 kg 75.3  kg    Examination:  General exam: No distress.  Looks chronically ill and deconditioned.   Respiratory system: Decreased breath sounds at bases bilaterally with some crackles  cardiovascular system: Tachycardic, S1-S2 heard  gastrointestinal system: Abdomen is distended slightly, soft and nontender.  Bowel sounds are heard Extremities: Bilateral lower extremity edema present; no clubbing Central nervous system: Awake and alert.  Slow  to respond.  Poor historian.  No focal neurological deficits.  Moves extremities.   Skin: No obvious ecchymosis/lesions  psychiatry: Flat affect  Data Reviewed: I have personally reviewed following labs and imaging studies  CBC: Recent Labs  Lab 04/26/21 0430 04/29/21 1030 05/01/21 0338  WBC 6.5 16.8* 8.9  NEUTROABS 5.3 15.2* 7.2  HGB 7.9* 7.5* 7.5*  HCT 24.6* 23.7* 23.6*  MCV 99.2 99.2 99.2  PLT 111* 93* 93*    Basic Metabolic Panel: Recent Labs  Lab 04/26/21 0430 04/29/21 1030 04/30/21 0512 05/01/21 0338  NA 139 140 137 136  K 4.8 4.1 4.6 4.5  CL 100 98 98 96*  CO2 20* 24 16* 20*  GLUCOSE 95 84 94 89  BUN 106* 89* 99* 103*  CREATININE 18.91* 16.64* 16.89* 17.30*  CALCIUM 8.4* 8.9 8.3* 8.2*  MG  --   --  2.4 2.4  PHOS  --   --  8.5* 7.8*    GFR: Estimated Creatinine Clearance: 4.6 mL/min (A) (by C-G formula based on SCr of 17.3 mg/dL (H)). Liver Function Tests: Recent Labs  Lab 04/26/21 0430 04/29/21 1030 04/30/21 0512 05/01/21 0338  AST '27 21 24 23  '$ ALT '14 14 13 16  '$ ALKPHOS 90 98 118 116  BILITOT 1.1 1.2 0.9 1.0  PROT 6.8 7.2 6.7 7.3  ALBUMIN 2.9* 2.8* 2.8* 3.0*    No results for input(s): LIPASE, AMYLASE in the last 168 hours. No results for input(s): AMMONIA in the last 168 hours. Coagulation Profile: No results for input(s): INR, PROTIME in the last 168 hours. Cardiac Enzymes: No results for input(s): CKTOTAL, CKMB, CKMBINDEX, TROPONINI in the last 168 hours. BNP (last 3 results) No results for input(s): PROBNP in the last 8760 hours. HbA1C: No results for input(s): HGBA1C in the last 72 hours. CBG: No results for input(s): GLUCAP in the last 168 hours. Lipid Profile: Recent Labs    04/29/21 2159  TRIG 105    Thyroid Function Tests: Recent Labs    04/30/21 0512  TSH 1.016    Anemia Panel: Recent Labs    04/30/21 0512 05/01/21 0338  FERRITIN 857* 989*    Sepsis Labs: Recent Labs  Lab 04/29/21 2159 04/30/21 0512   PROCALCITON 21.91  --   LATICACIDVEN 2.5* 2.2*     Recent Results (from the past 240 hour(s))  Resp Panel by RT-PCR (Flu A&B, Covid) Nasopharyngeal Swab     Status: Abnormal   Collection Time: 04/29/21  8:03 PM   Specimen: Nasopharyngeal Swab; Nasopharyngeal(NP) swabs in vial transport medium  Result Value Ref Range Status   SARS Coronavirus 2 by RT PCR POSITIVE (A) NEGATIVE Final    Comment: RESULT CALLED TO, READ BACK BY AND VERIFIED WITH: J FERRAINOL,RN'@2120'$  04/29/21 [facility] [facility] (NOTE) SARS-CoV-2 target nucleic acids are DETECTED.  The SARS-CoV-2 RNA is generally detectable in upper respiratory specimens during the acute phase of infection. Positive results are indicative of the presence of the identified virus, but do not rule out bacterial infection or co-infection with other pathogens not detected by the test. Clinical correlation with patient history and other diagnostic  information is necessary to determine patient infection status. The expected result is Negative.  Fact Sheet for Patients: EntrepreneurPulse.com.au  Fact Sheet for Healthcare Providers: IncredibleEmployment.be  This test is not yet approved or cleared by the [place] FDA and  has been authorized for detection and/or diagnosis of SARS-CoV-2 by FDA under an Emergency Use Authorization (EUA).  This EUA will remain in effect (meaning this test can be u sed) for the duration of  the COVID-19 declaration under Section 564(b)(1) of the Act, 21 U.S.C. section 360bbb-3(b)(1), unless the authorization is terminated or revoked sooner.     Influenza A by PCR NEGATIVE NEGATIVE Final   Influenza B by PCR NEGATIVE NEGATIVE Final    Comment: (NOTE) The Xpert Xpress SARS-CoV-2/FLU/RSV plus assay is intended as an aid in the diagnosis of influenza from Nasopharyngeal swab specimens and should not be used as a sole basis for treatment. Nasal washings and aspirates are unacceptable for  Xpert Xpress SARS-CoV-2/FLU/RSV testing.  Fact Sheet for Patients: EntrepreneurPulse.com.au  Fact Sheet for Healthcare Providers: IncredibleEmployment.be  This test is not yet approved or cleared by the [place] FDA and has been authorized for detection and/or diagnosis of SARS-CoV-2 by FDA under an Emergency Use Authorization (EUA). This EUA will remain in effect (meaning this test can be used) for the duration of the COVID-19 declaration under Section 564(b)(1) of the Act, 21 U.S.C. section 360bbb-3(b)(1), unless the authorization is terminated or revoked.  Performed at [facility], [place] [address], [place], [place] [address]   Blood Culture (routine x 2)     Status: None (Preliminary result)   Collection Time: 04/29/21 10:40 PM   Specimen: BLOOD RIGHT FOREARM  Result Value Ref Range Status   Specimen Description BLOOD RIGHT FOREARM  Final   Special Requests   Final    BOTTLES DRAWN AEROBIC AND ANAEROBIC Blood Culture results may not be optimal due to an inadequate volume of blood received in culture bottles   Culture   Final    NO GROWTH 2 DAYS Performed at [facility], [place] [name] [place], [place] [address]    Report Status PENDING  Incomplete  Blood Culture (routine x 2)     Status: None (Preliminary result)   Collection Time: 04/29/21 10:48 PM   Specimen: BLOOD RIGHT HAND  Result Value Ref Range Status   Specimen Description BLOOD RIGHT HAND  Final   Special Requests   Final    BOTTLES DRAWN AEROBIC AND ANAEROBIC Blood Culture results may not be optimal due to an inadequate volume of blood received in culture bottles   Culture   Final    NO GROWTH 2 DAYS Performed at [facility], [place] [address], [place],  [address]    Report Status PENDING  Incomplete          Radiology Studies: VAS [place] LOWER EXTREMITY VENOUS (DVT)  Result Date: 05/01/2021  Lower Venous DVT Study Patient Name:  Jordan [name]  Date of Exam:   05/01/2021 Medical Rec #: [id number]         Accession #:    [id number] Date of Birth: [birth year]         Patient Gender: M Patient Age:   49 years Exam Location:  [facility] Procedure:      VAS [place] LOWER EXTREMITY VENOUS (DVT) Referring Phys: Annamaria Boots XU --------------------------------------------------------------------------------  Indications: Edema of left leg, Covid-19.  Limitations: Posterior acoustic shadowing due to overlying arterial calcification,  tissue properties due to subcutaneous edema, and patient positioning. Comparison Study: No prior studies. Performing Technologist: Darlin Coco RDMS,RVT  Examination Guidelines: A complete evaluation includes B-mode imaging, spectral Doppler, color Doppler, and power Doppler as needed of all accessible portions of each vessel. Bilateral testing is considered an integral part of a complete examination. Limited examinations for reoccurring indications may be performed as noted. The reflux portion of the exam is performed with the patient in reverse Trendelenburg.  +-----+---------------+---------+-----------+----------+--------------+ RIGHTCompressibilityPhasicitySpontaneityPropertiesThrombus Aging +-----+---------------+---------+-----------+----------+--------------+ CFV  Full           No       Yes                                 +-----+---------------+---------+-----------+----------+--------------+   +---------+---------------+---------+-----------+----------+--------------+ LEFT     CompressibilityPhasicitySpontaneityPropertiesThrombus Aging +---------+---------------+---------+-----------+----------+--------------+ CFV      Full           No       Yes                                 +---------+---------------+---------+-----------+----------+--------------+ SFJ      Full                                                         +---------+---------------+---------+-----------+----------+--------------+ FV Prox  Full                                                        +---------+---------------+---------+-----------+----------+--------------+ FV Mid   Full                                                        +---------+---------------+---------+-----------+----------+--------------+ FV DistalFull                                                        +---------+---------------+---------+-----------+----------+--------------+ PFV      Full                                                        +---------+---------------+---------+-----------+----------+--------------+ POP      Full           No       Yes                                 +---------+---------------+---------+-----------+----------+--------------+ PTV      Full                                                        +---------+---------------+---------+-----------+----------+--------------+  PERO     Full                                                        +---------+---------------+---------+-----------+----------+--------------+ Gastroc  Full                                                        +---------+---------------+---------+-----------+----------+--------------+    Summary: RIGHT: - No evidence of common femoral vein obstruction. -  LEFT: - There is no evidence of deep vein thrombosis in the lower extremity.  - No cystic structure found in the popliteal fossa.  - Pulsatile venous waveforms observed throughout extremity, suggestive of elevated right heart pressures.  *See table(s) above for measurements and observations. Electronically signed by Harold Barban MD on 05/01/2021 at 6:54:30 PM.    Final         Scheduled Meds:  calcium acetate  1,334 mg Oral TID WC   Chlorhexidine Gluconate Cloth  6 each Topical Q0600   cinacalcet  30 mg Oral Q breakfast   guaiFENesin  600 mg Oral BID   hydrALAZINE  50  mg Oral TID   nicotine  21 mg Transdermal Daily   sodium chloride flush  3 mL Intravenous Q12H   sodium chloride flush  3 mL Intravenous Q12H   thiamine  100 mg Oral Daily   Continuous Infusions:  sodium chloride     sodium chloride            Aline August, MD [facility] 05/02/2021, 8:09 AM

## 2021-05-03 ENCOUNTER — Inpatient Hospital Stay (HOSPITAL_COMMUNITY): Payer: Medicare Other

## 2021-05-03 LAB — COMPREHENSIVE METABOLIC PANEL
ALT: 17 U/L (ref 0–44)
AST: 24 U/L (ref 15–41)
Albumin: 2.8 g/dL — ABNORMAL LOW (ref 3.5–5.0)
Alkaline Phosphatase: 118 U/L (ref 38–126)
Anion gap: 17 — ABNORMAL HIGH (ref 5–15)
BUN: 70 mg/dL — ABNORMAL HIGH (ref 6–20)
CO2: 23 mmol/L (ref 22–32)
Calcium: 8.1 mg/dL — ABNORMAL LOW (ref 8.9–10.3)
Chloride: 96 mmol/L — ABNORMAL LOW (ref 98–111)
Creatinine, Ser: 12.27 mg/dL — ABNORMAL HIGH (ref 0.61–1.24)
GFR, Estimated: 4 mL/min — ABNORMAL LOW (ref >60)
Glucose, Bld: 81 mg/dL (ref 70–99)
Potassium: 4 mmol/L (ref 3.5–5.1)
Sodium: 136 mmol/L (ref 135–145)
Total Bilirubin: 0.8 mg/dL (ref 0.3–1.2)
Total Protein: 7.2 g/dL (ref 6.5–8.1)

## 2021-05-03 LAB — CBC WITH DIFFERENTIAL/PLATELET
Abs Immature Granulocytes: 0.36 K/uL — ABNORMAL HIGH (ref 0.00–0.07)
Basophils Absolute: 0 K/uL (ref 0.0–0.1)
Basophils Relative: 0 %
Eosinophils Absolute: 0.2 K/uL (ref 0.0–0.5)
Eosinophils Relative: 3 %
HCT: 22.6 % — ABNORMAL LOW (ref 39.0–52.0)
Hemoglobin: 7.1 g/dL — ABNORMAL LOW (ref 13.0–17.0)
Immature Granulocytes: 5 %
Lymphocytes Relative: 8 %
Lymphs Abs: 0.6 K/uL — ABNORMAL LOW (ref 0.7–4.0)
MCH: 31.1 pg (ref 26.0–34.0)
MCHC: 31.4 g/dL (ref 30.0–36.0)
MCV: 99.1 fL (ref 80.0–100.0)
Monocytes Absolute: 0.7 K/uL (ref 0.1–1.0)
Monocytes Relative: 10 %
Neutro Abs: 5.2 K/uL (ref 1.7–7.7)
Neutrophils Relative %: 74 %
Platelets: 105 K/uL — ABNORMAL LOW (ref 150–400)
RBC: 2.28 MIL/uL — ABNORMAL LOW (ref 4.22–5.81)
RDW: 15.5 % (ref 11.5–15.5)
WBC: 7 K/uL (ref 4.0–10.5)
nRBC: 0 % (ref 0.0–0.2)

## 2021-05-03 LAB — MAGNESIUM: Magnesium: 2.2 mg/dL (ref 1.7–2.4)

## 2021-05-03 LAB — D-DIMER, QUANTITATIVE: D-Dimer, Quant: 14.61 ug{FEU}/mL — ABNORMAL HIGH (ref 0.00–0.50)

## 2021-05-03 LAB — FERRITIN: Ferritin: 985 ng/mL — ABNORMAL HIGH (ref 24–336)

## 2021-05-03 IMAGING — CT CT ANGIO CHEST
2 of 6 series · 18 of 36 positions shown · IV contrast (omnipaque)
Comparison: [DATE]

CLINICAL DATA: PE suspected, elevated D-dimer, COVID

EXAM:
CT ANGIOGRAPHY CHEST WITH CONTRAST
TECHNIQUE: Multidetector CT imaging of the chest was performed using the
standard protocol during bolus administration of intravenous
contrast. Multiplanar CT image reconstructions and MIPs were
obtained to evaluate the vascular anatomy.
CONTRAST:  75mL OMNIPAQUE IOHEXOL 350 MG/ML SOLN

[Series 7: pe thins · axial · 0.87mm/px · z∈[+1050,+1308]mm · 17 of 410 slices shown]
[im 21/410  lung]
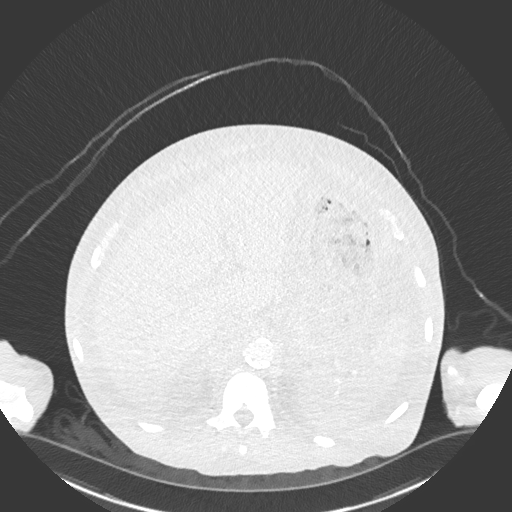
[im 41/410  mediastinal]
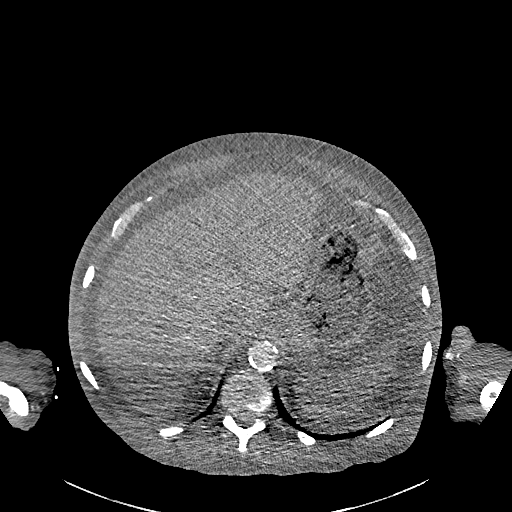
[im 62/410  lung]
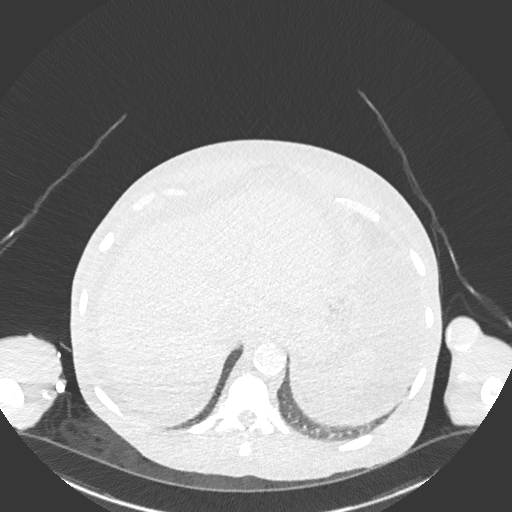
[im 82/410  mediastinal]
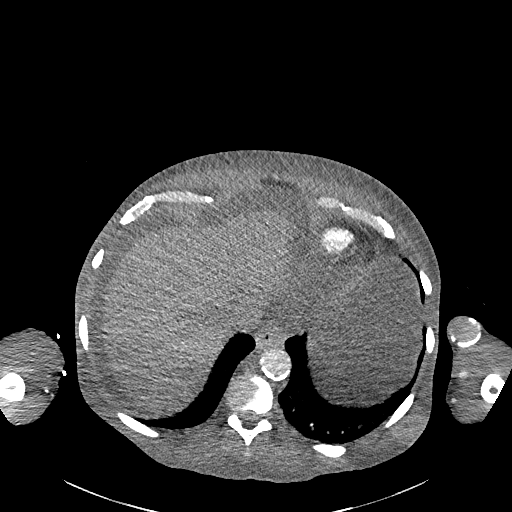
[im 123/410  lung]
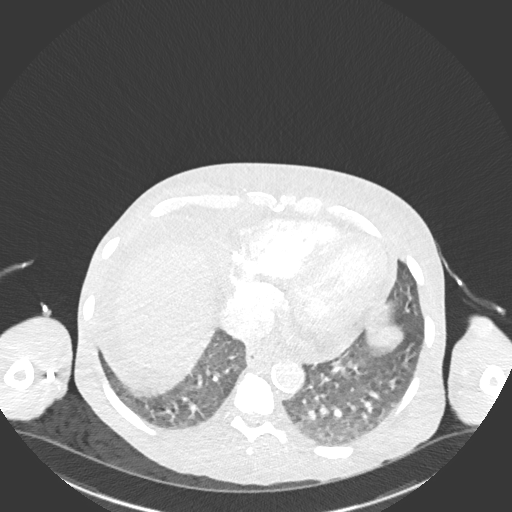
[im 144/410  mediastinal]
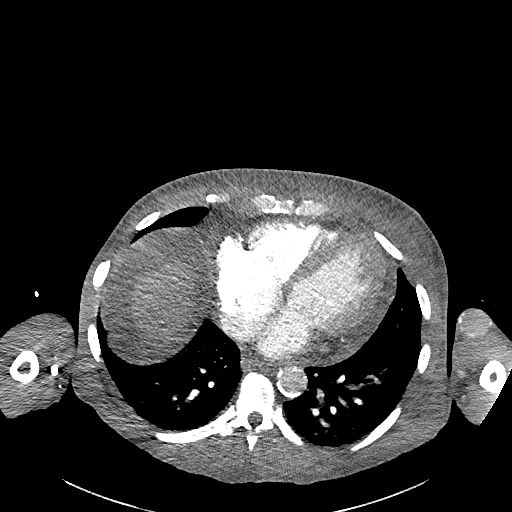
[im 164/410  lung]
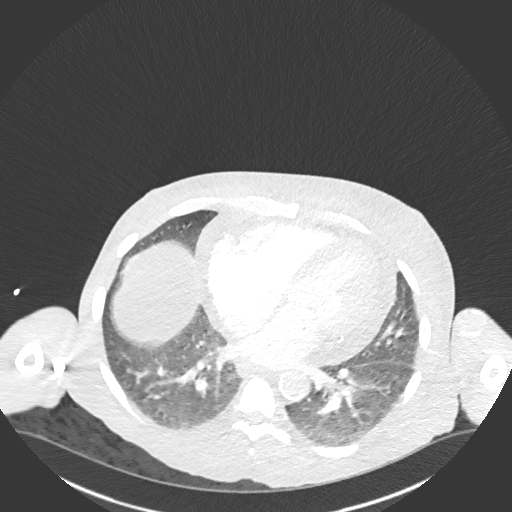
[im 185/410  mediastinal]
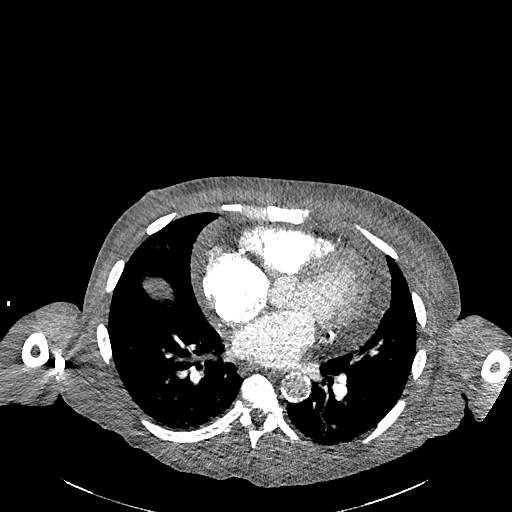
[im 205/410  lung]
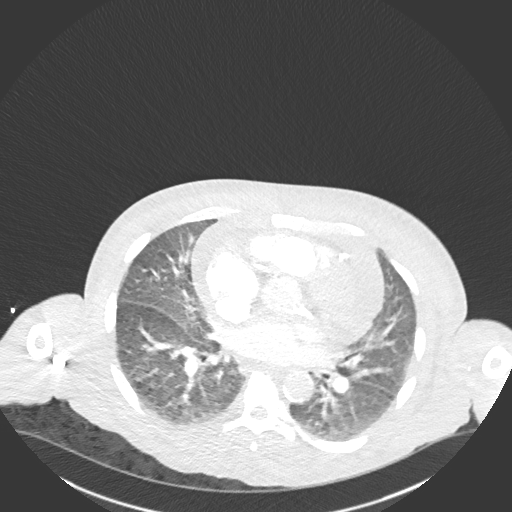
[im 225/410  mediastinal]
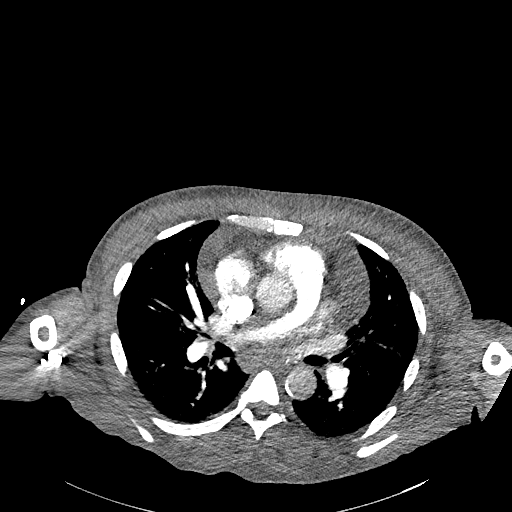
[im 246/410  lung]
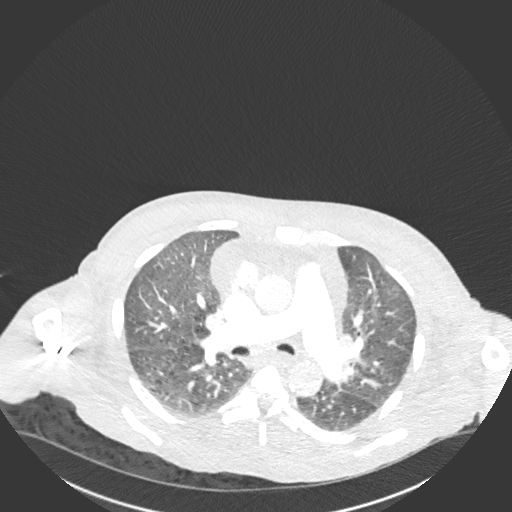
[im 266/410  mediastinal]
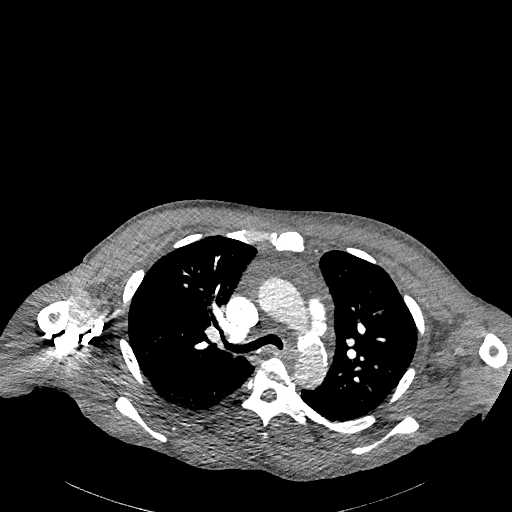
[im 287/410  lung]
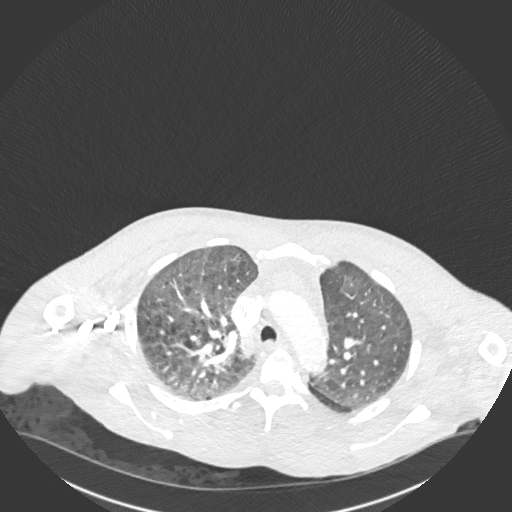
[im 328/410  mediastinal]
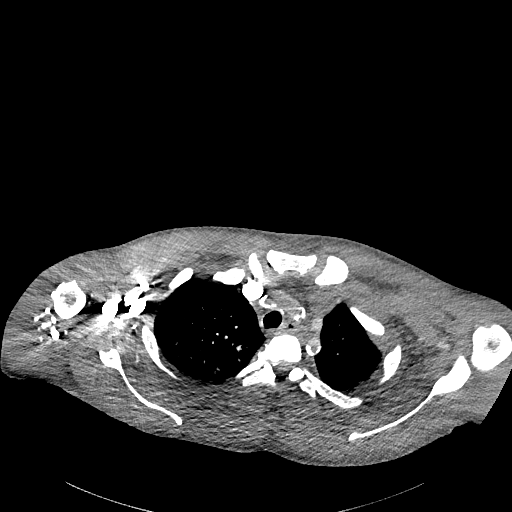
[im 348/410  lung]
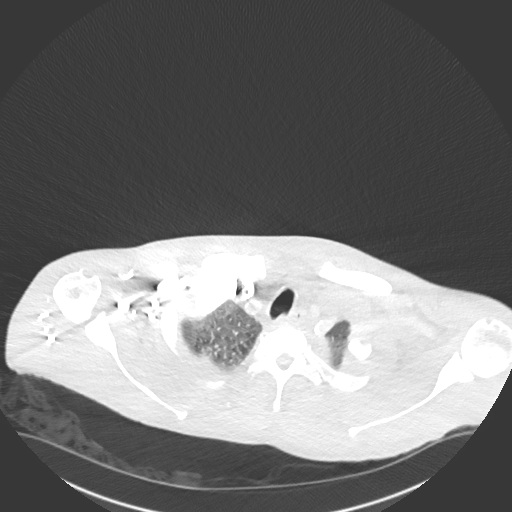
[im 369/410  mediastinal]
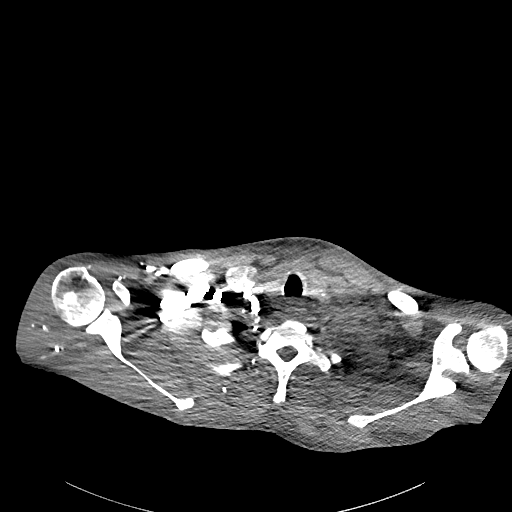
[im 389/410  lung]
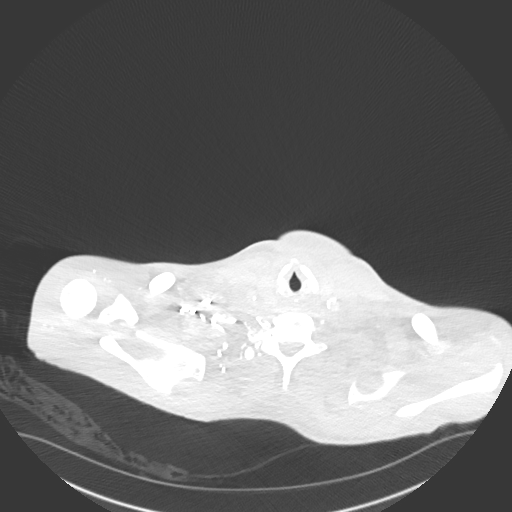

[Series 8: pe 2mm cor · coronal · 0.59mm/px · 1 of 151 slices shown]
[im 76/151  mediastinal]
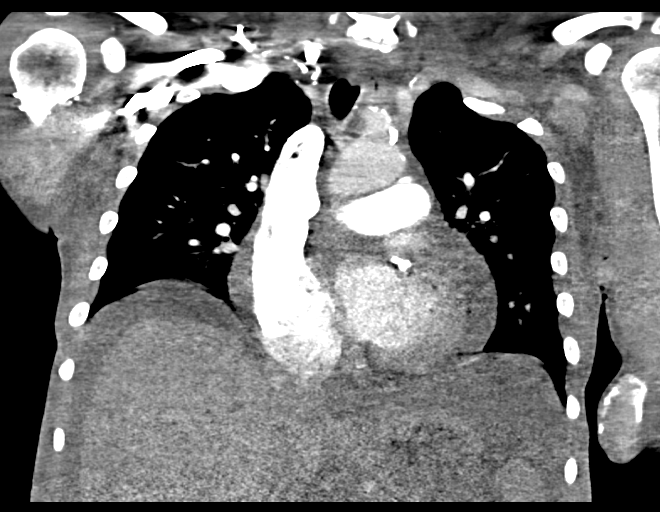

[18 of 36 positions shown; findings below may reference images not displayed]

FINDINGS: Cardiovascular: Satisfactory opacification of the pulmonary arteries
to the segmental level. No evidence of pulmonary embolism.
Cardiomegaly. Extensive 3 vessel coronary artery calcifications
and/or stents. Moderate pericardial effusion. Aortic
atherosclerosis

Mediastinum/Nodes: No enlarged mediastinal, hilar, or axillary lymph
nodes. Thyroid gland, trachea, and esophagus demonstrate no
significant findings.

Lungs/Pleura: Diffuse bilateral ground-glass airspace attenuation,
superimposed upon moderate centrilobular emphysema. No pleural
effusion or pneumothorax.

Upper Abdomen: Moderate volume ascites in the included upper
abdomen. Somewhat coarse contour of the included liver.

Musculoskeletal: No chest wall abnormality.  Renal osteodystrophy.

Review of the MIP images confirms the above findings.
IMPRESSION: 1. Negative examination for pulmonary embolism.
2. Diffuse bilateral ground-glass airspace attenuation, superimposed
upon moderate centrilobular emphysema. Although potentially in
keeping with COVID airspace disease, this appearance is generally
nonspecific and may reflect a component of pulmonary edema.
3. Cardiomegaly and coronary artery disease.
4. Moderate pericardial effusion.
5. Moderate volume ascites in the included upper abdomen.
6. Somewhat coarse contour of the included liver, suggestive of
cirrhosis.

Aortic Atherosclerosis ([EI]-[EI]) and Emphysema ([EI]-[EI]).

## 2021-05-03 MED ORDER — IOHEXOL 350 MG/ML SOLN
75.0000 mL | Freq: Once | INTRAVENOUS | Status: AC | PRN
  Administered 2021-05-03: 75 mL via INTRAVENOUS

## 2021-05-03 MED ORDER — GUAIFENESIN-DM 100-10 MG/5ML PO SYRP
5.0000 mL | ORAL_SOLUTION | ORAL | Status: DC | PRN
  Administered 2021-05-03: 5 mL via ORAL
  Filled 2021-05-03: qty 5

## 2021-05-03 NOTE — Progress Notes (Signed)
C/O cough and requesting medication to treat. Notified Dr Nevada Crane via amnion text page.

## 2021-05-03 NOTE — Progress Notes (Signed)
[facility] Progress Note  Subjective: Patient not seen directly today given COVID-19 + status, utilizing data taken from chart +/- discussions w/ providers and staff.     Vitals:   05/02/21 1744 05/02/21 2120 05/03/21 0628 05/03/21 1151  BP: (!) 124/103 140/82 117/79 (!) 151/92  Pulse: (!) 101 98 (!) 104 (!) 107  Resp: '16 17 20 18  '$ Temp: 97.7 F (36.5 C) 98.6 F (37 C) 98.3 F (36.8 C) 97.8 F (36.6 C)  TempSrc: Oral Oral Oral Oral  SpO2: 99% 99% 97% 100%  Weight:  75.3 kg    Height:        Exam: Gen alert, no distress No jvd or bruits Chest clear bilat to base RRR no MRG Abd soft ntnd no mass, prob ascites 2+ GU normal MS no joint effusions or deformity Ext 1+ LLE edema, no RLE edema Neuro is alert, Ox 3 , nf   LFA AVF+bruit     Home meds include xanax 0.25 bid, auryxia ac tid 2, phoslo 2 ac tid, sensipar 30 qam, hydralazine 50 tid, thiamine, velphoro 500 tid ac        OP HD: MWF [facility]   4h  450/800  67kg   2/2 bath  LUE AVF   Hep none  - pending     Assessment/ Plan: SOB/ pulm edema - due to vol overload. 3.2 L off on HD here 8/10. See also #3 below. Will get f/u CXR after HD today.  ESRD - on HD  MWF.  HD yesterday. Next HD today.  Chronic vol overload/ chronic noncompliance w/ dialysis - pt signs off early and no-shows for HD regularly. In OP setting he is usually 8-10 kg over his "dry wt". Here on admission he was 8-10 kg+  w/ pulm edema on CXR. Will need to stay until vol overload is reasonably controlled. Had a long discussion w/ pt 8/11 about long-term fluid overload and the damage it will cause.  COVID 19 infection - mild URI sx's, sp 1 dose of bebtelovimab. On home O2 w/o new O2 requirement.  PAF - per pmd HTN - resume meds Anemia ckd - Hb 7's here, no need tranfusion yet. H/o etoh abuse - in remission x several mos COPD - on home O2      Jordan [name] 05/03/2021, 4:27 PM   Recent Labs  Lab 04/30/21 0512 05/01/21 0338  05/03/21 0426  K 4.6 4.5 4.0  BUN 99* 103* 70*  CREATININE 16.89* 17.30* 12.27*  CALCIUM 8.3* 8.2* 8.1*  PHOS 8.5* 7.8*  --   HGB  --  7.5* 7.1*    Inpatient medications:  calcium acetate  1,334 mg Oral TID WC   Chlorhexidine Gluconate Cloth  6 each Topical Q0600   cinacalcet  30 mg Oral Q breakfast   guaiFENesin  600 mg Oral BID   hydrALAZINE  50 mg Oral TID   nicotine  21 mg Transdermal Daily   sodium chloride flush  3 mL Intravenous Q12H   thiamine  100 mg Oral Daily    sodium chloride     sodium chloride, acetaminophen **OR** acetaminophen, albuterol, ALPRAZolam, chlorpheniramine-HYDROcodone, guaiFENesin-dextromethorphan, hydrocortisone, hydrOXYzine, sodium chloride flush

## 2021-05-03 NOTE — Progress Notes (Signed)
Patient ID: Jordan [name], male   DOB: [birth year], 55 y.o.   MRN: [id number]  PROGRESS NOTE    Jordan [name]  [id number] DOB: [birth year] DOA: 04/29/2021 PCP: Pcp, No   Brief Narrative:  55 year old male with history of end-stage renal disease on hemodialysis, COPD, alcohol abuse, anemia, hypertension, chronic diastolic CHF, tobacco abuse, A. fib presented with not feeling well, itching and not being able to afford Atarax and subsequently missed hemodialysis.  On presentation, he was found to be COVID-positive and was treated with monoclonal antibody.  He was found to have pulmonary edema.  Nephrology was consulted.  Assessment & Plan:   End-stage renal disease on hemodialysis Pulmonary edema Acute on chronic combined systolic and diastolic CHF -Patient presented with volume overload after missing his hemodialysis.  Respiratory status currently stable. -Nephrology following and patient undergoing dialysis as per nephrology schedule.  Dialysis scheduled for today again.  COVID-19 infection Chronic hypoxic respiratory failure/COPD -Probably incidental finding.  Patient is vaccinated and boosted.  Respiratory status stable  -Inflammatory markers are elevated but doubt that patient needs further treatment for COVID-19.  Received a dose of monoclonal antibody on presentation. -COVID-19 Labs  Recent Labs    05/01/21 0338 05/03/21 0426  DDIMER 5.62* 14.61*  FERRITIN 989* 985*     Lab Results  Component Value Date   SARSCOV2NAA POSITIVE (A) 04/29/2021   SARSCOV2NAA NEGATIVE 04/08/2021   [place] NEGATIVE 03/28/2021   [place] NEGATIVE 03/25/2021   -Continue isolation -D-dimer has significantly elevated.  We will proceed with CTA chest.  Respiratory status still remains stable.  Leukocytosis -Resolved  Thrombocytopenia -Chronic.  Stable.  No signs of bleeding  Anemia of chronic disease -Hemoglobin 7.1 today.  Transfuse if hemoglobin is less than 7  Paroxysmal A.  fib -Currently rate controlled.  Not on anticoagulation as an outpatient.  Essential hypertension -Blood pressure intermittently elevated.  Continue hydralazine  Alcohol abuse In remission  Pruritus -Chronic.  Continue Atarax  Chronic tobacco use--continue nicotine patch    DVT prophylaxis: SCDs Code Status: Full Family Communication: None at bedside Disposition Plan: Status is: Inpatient  Remains inpatient appropriate because:Inpatient level of care appropriate due to severity of illness  Dispo: The patient is from: Home              Anticipated d/c is to: Home              Patient currently is not medically stable to d/c.   Difficult to place patient No  Consultants: Nephrology  Procedures: None  Antimicrobials: None   Subjective: Patient seen and examined at bedside.  Poor historian.  Complains of some cough.  No overnight worsening fever, vomiting, chest pain reported.   Objective: Vitals:   05/02/21 0554 05/02/21 1744 05/02/21 2120 05/03/21 0628  BP: 133/70 (!) 124/103 140/82 117/79  Pulse: (!) 103 (!) 101 98 (!) 104  Resp: '16 16 17 20  '$ Temp: 98.6 F (37 C) 97.7 F (36.5 C) 98.6 F (37 C) 98.3 F (36.8 C)  TempSrc: Oral Oral Oral Oral  SpO2: 98% 99% 99% 97%  Weight:   75.3 kg   Height:        Intake/Output Summary (Last 24 hours) at 05/03/2021 1012 Last data filed at 05/03/2021 0000 Gross per 24 hour  Intake 540 ml  Output 0 ml  Net 540 ml    Filed Weights   05/01/21 2126 05/01/21 2233 05/02/21 2120  Weight: 75.3 kg 75.3 kg 75.3 kg    Examination:  General exam: No acute distress.  Requiring 2 L oxygen by nasal cannula intermittently.  Looks chronically ill and deconditioned.   Respiratory system: Bilateral decreased breath sounds at bases with scattered crackles  cardiovascular system: S1-S2 heard; tachycardic intermittently gastrointestinal system: Abdomen is mildly distended, soft and nontender.  Normal bowel sounds heard  extremities: No  cyanosis; lower extremity edema present bilaterally Central nervous system: Sleepy, wakes up slightly, extremely slow to respond.  Poor historian.  No focal neurological deficits.  Moving extremities  skin: No obvious petechiae/rashes  psychiatry: Affect is extremely flat and hardly participates in any conversation. Data Reviewed: I have personally reviewed following labs and imaging studies  CBC: Recent Labs  Lab 04/29/21 1030 05/01/21 0338 05/03/21 0426  WBC 16.8* 8.9 7.0  NEUTROABS 15.2* 7.2 5.2  HGB 7.5* 7.5* 7.1*  HCT 23.7* 23.6* 22.6*  MCV 99.2 99.2 99.1  PLT 93* 93* 105*    Basic Metabolic Panel: Recent Labs  Lab 04/29/21 1030 04/30/21 0512 05/01/21 0338 05/03/21 0426  NA 140 137 136 136  K 4.1 4.6 4.5 4.0  CL 98 98 96* 96*  CO2 24 16* 20* 23  GLUCOSE 84 94 89 81  BUN 89* 99* 103* 70*  CREATININE 16.64* 16.89* 17.30* 12.27*  CALCIUM 8.9 8.3* 8.2* 8.1*  MG  --  2.4 2.4 2.2  PHOS  --  8.5* 7.8*  --     GFR: Estimated Creatinine Clearance: 6.5 mL/min (A) (by C-G formula based on SCr of 12.27 mg/dL (H)). Liver Function Tests: Recent Labs  Lab 04/29/21 1030 04/30/21 0512 05/01/21 0338 05/03/21 0426  AST '21 24 23 24  '$ ALT '14 13 16 17  '$ ALKPHOS 98 118 116 118  BILITOT 1.2 0.9 1.0 0.8  PROT 7.2 6.7 7.3 7.2  ALBUMIN 2.8* 2.8* 3.0* 2.8*    No results for input(s): LIPASE, AMYLASE in the last 168 hours. No results for input(s): AMMONIA in the last 168 hours. Coagulation Profile: No results for input(s): INR, PROTIME in the last 168 hours. Cardiac Enzymes: No results for input(s): CKTOTAL, CKMB, CKMBINDEX, TROPONINI in the last 168 hours. BNP (last 3 results) No results for input(s): PROBNP in the last 8760 hours. HbA1C: No results for input(s): HGBA1C in the last 72 hours. CBG: No results for input(s): GLUCAP in the last 168 hours. Lipid Profile: No results for input(s): CHOL, HDL, LDLCALC, TRIG, CHOLHDL, LDLDIRECT in the last 72 hours.  Thyroid  Function Tests: No results for input(s): TSH, T4TOTAL, FREET4, T3FREE, THYROIDAB in the last 72 hours.  Anemia Panel: Recent Labs    05/01/21 0338 05/03/21 0426  FERRITIN 989* 985*    Sepsis Labs: Recent Labs  Lab 04/29/21 2159 04/30/21 0512  PROCALCITON 21.91  --   LATICACIDVEN 2.5* 2.2*     Recent Results (from the past 240 hour(s))  Resp Panel by RT-PCR (Flu A&B, Covid) Nasopharyngeal Swab     Status: Abnormal   Collection Time: 04/29/21  8:03 PM   Specimen: Nasopharyngeal Swab; Nasopharyngeal(NP) swabs in vial transport medium  Result Value Ref Range Status   SARS Coronavirus 2 by RT PCR POSITIVE (A) NEGATIVE Final    Comment: RESULT CALLED TO, READ BACK BY AND VERIFIED WITH: J FERRAINOL,RN'@2120'$  04/29/21 [place] (NOTE) SARS-CoV-2 target nucleic acids are DETECTED.  The SARS-CoV-2 RNA is generally detectable in upper respiratory specimens during the acute phase of infection. Positive results are indicative of the presence of the identified virus, but do not rule out bacterial infection or co-infection with  other pathogens not detected by the test. Clinical correlation with patient history and other diagnostic information is necessary to determine patient infection status. The expected result is Negative.  Fact Sheet for Patients: EntrepreneurPulse.com.au  Fact Sheet for Healthcare Providers: IncredibleEmployment.be  This test is not yet approved or cleared by the [place] FDA and  has been authorized for detection and/or diagnosis of SARS-CoV-2 by FDA under an Emergency Use Authorization (EUA).  This EUA will remain in effect (meaning this test can be u sed) for the duration of  the COVID-19 declaration under Section 564(b)(1) of the Act, 21 U.S.C. section 360bbb-3(b)(1), unless the authorization is terminated or revoked sooner.     Influenza A by PCR NEGATIVE NEGATIVE Final   Influenza B by PCR NEGATIVE NEGATIVE Final     Comment: (NOTE) The Xpert Xpress SARS-CoV-2/FLU/RSV plus assay is intended as an aid in the diagnosis of influenza from Nasopharyngeal swab specimens and should not be used as a sole basis for treatment. Nasal washings and aspirates are unacceptable for Xpert Xpress SARS-CoV-2/FLU/RSV testing.  Fact Sheet for Patients: EntrepreneurPulse.com.au  Fact Sheet for Healthcare Providers: IncredibleEmployment.be  This test is not yet approved or cleared by the [place] FDA and has been authorized for detection and/or diagnosis of SARS-CoV-2 by FDA under an Emergency Use Authorization (EUA). This EUA will remain in effect (meaning this test can be used) for the duration of the COVID-19 declaration under Section 564(b)(1) of the Act, 21 U.S.C. section 360bbb-3(b)(1), unless the authorization is terminated or revoked.  Performed at [facility], [place] [address] [place], [place] [address]   Blood Culture (routine x 2)     Status: None (Preliminary result)   Collection Time: 04/29/21 10:40 PM   Specimen: BLOOD RIGHT FOREARM  Result Value Ref Range Status   Specimen Description BLOOD RIGHT FOREARM  Final   Special Requests   Final    BOTTLES DRAWN AEROBIC AND ANAEROBIC Blood Culture results may not be optimal due to an inadequate volume of blood received in culture bottles   Culture   Final    NO GROWTH 3 DAYS Performed at [facility], [place] [address] [place], [place] [address]    Report Status PENDING  Incomplete  Blood Culture (routine x 2)     Status: None (Preliminary result)   Collection Time: 04/29/21 10:48 PM   Specimen: BLOOD RIGHT HAND  Result Value Ref Range Status   Specimen Description BLOOD RIGHT HAND  Final   Special Requests   Final    BOTTLES DRAWN AEROBIC AND ANAEROBIC Blood Culture results may not be optimal due to an inadequate volume of blood received in culture bottles   Culture   Final    NO GROWTH 3  DAYS Performed at [facility], [place] [address], [place],  [address]    Report Status PENDING  Incomplete          Radiology Studies: VAS [place] LOWER EXTREMITY VENOUS (DVT)  Result Date: 05/01/2021  Lower Venous DVT Study Patient Name:  Jordan [name]  Date of Exam:   05/01/2021 Medical Rec #: [id number]         Accession #:    [id number] Date of Birth: November 07, 1965         Patient Gender: M Patient Age:   74 years Exam Location:  [facility] Procedure:      VAS [place] LOWER EXTREMITY VENOUS (DVT) Referring Phys: Annamaria Boots XU --------------------------------------------------------------------------------  Indications:  Edema of left leg, Covid-19.  Limitations: Posterior acoustic shadowing due to overlying arterial calcification, tissue properties due to subcutaneous edema, and patient positioning. Comparison Study: No prior studies. Performing Technologist: Darlin Coco RDMS,RVT  Examination Guidelines: A complete evaluation includes B-mode imaging, spectral Doppler, color Doppler, and power Doppler as needed of all accessible portions of each vessel. Bilateral testing is considered an integral part of a complete examination. Limited examinations for reoccurring indications may be performed as noted. The reflux portion of the exam is performed with the patient in reverse Trendelenburg.  +-----+---------------+---------+-----------+----------+--------------+ RIGHTCompressibilityPhasicitySpontaneityPropertiesThrombus Aging +-----+---------------+---------+-----------+----------+--------------+ CFV  Full           No       Yes                                 +-----+---------------+---------+-----------+----------+--------------+   +---------+---------------+---------+-----------+----------+--------------+ LEFT     CompressibilityPhasicitySpontaneityPropertiesThrombus Aging +---------+---------------+---------+-----------+----------+--------------+ CFV      Full           No        Yes                                 +---------+---------------+---------+-----------+----------+--------------+ SFJ      Full                                                        +---------+---------------+---------+-----------+----------+--------------+ FV Prox  Full                                                        +---------+---------------+---------+-----------+----------+--------------+ FV Mid   Full                                                        +---------+---------------+---------+-----------+----------+--------------+ FV DistalFull                                                        +---------+---------------+---------+-----------+----------+--------------+ PFV      Full                                                        +---------+---------------+---------+-----------+----------+--------------+ POP      Full           No       Yes                                 +---------+---------------+---------+-----------+----------+--------------+ PTV      Full                                                        +---------+---------------+---------+-----------+----------+--------------+  PERO     Full                                                        +---------+---------------+---------+-----------+----------+--------------+ Gastroc  Full                                                        +---------+---------------+---------+-----------+----------+--------------+    Summary: RIGHT: - No evidence of common femoral vein obstruction. -  LEFT: - There is no evidence of deep vein thrombosis in the lower extremity.  - No cystic structure found in the popliteal fossa.  - Pulsatile venous waveforms observed throughout extremity, suggestive of elevated right heart pressures.  *See table(s) above for measurements and observations. Electronically signed by Harold Barban MD on 05/01/2021 at 6:54:30 PM.    Final          Scheduled Meds:  calcium acetate  1,334 mg Oral TID WC   Chlorhexidine Gluconate Cloth  6 each Topical Q0600   cinacalcet  30 mg Oral Q breakfast   guaiFENesin  600 mg Oral BID   hydrALAZINE  50 mg Oral TID   nicotine  21 mg Transdermal Daily   sodium chloride flush  3 mL Intravenous Q12H   thiamine  100 mg Oral Daily   Continuous Infusions:  sodium chloride            Aline August, MD [facility] 05/03/2021, 10:12 AM

## 2021-05-04 ENCOUNTER — Inpatient Hospital Stay (HOSPITAL_COMMUNITY): Payer: Medicare Other

## 2021-05-04 LAB — BPAM RBC
Blood Product Expiration Date: 202208232359
Blood Product Expiration Date: 202208242359
Unit Type and Rh: 9500
Unit Type and Rh: 9500

## 2021-05-04 LAB — CULTURE, BLOOD (ROUTINE X 2)
Culture: NO GROWTH
Culture: NO GROWTH

## 2021-05-04 LAB — TYPE AND SCREEN
ABO/RH(D): O NEG
Antibody Screen: POSITIVE
DAT, IgG: POSITIVE
Donor AG Type: NEGATIVE
Donor AG Type: NEGATIVE
Unit division: 0
Unit division: 0

## 2021-05-04 IMAGING — DX DG CHEST 1V PORT
1 series · 1 of 1 positions shown · non-contrast
Comparison: Chest x-ray [DATE], CT chest [DATE]

CLINICAL DATA: 54-year-old male with cough and shortness of breath

EXAM:
PORTABLE CHEST 1 VIEW

[chest ap]
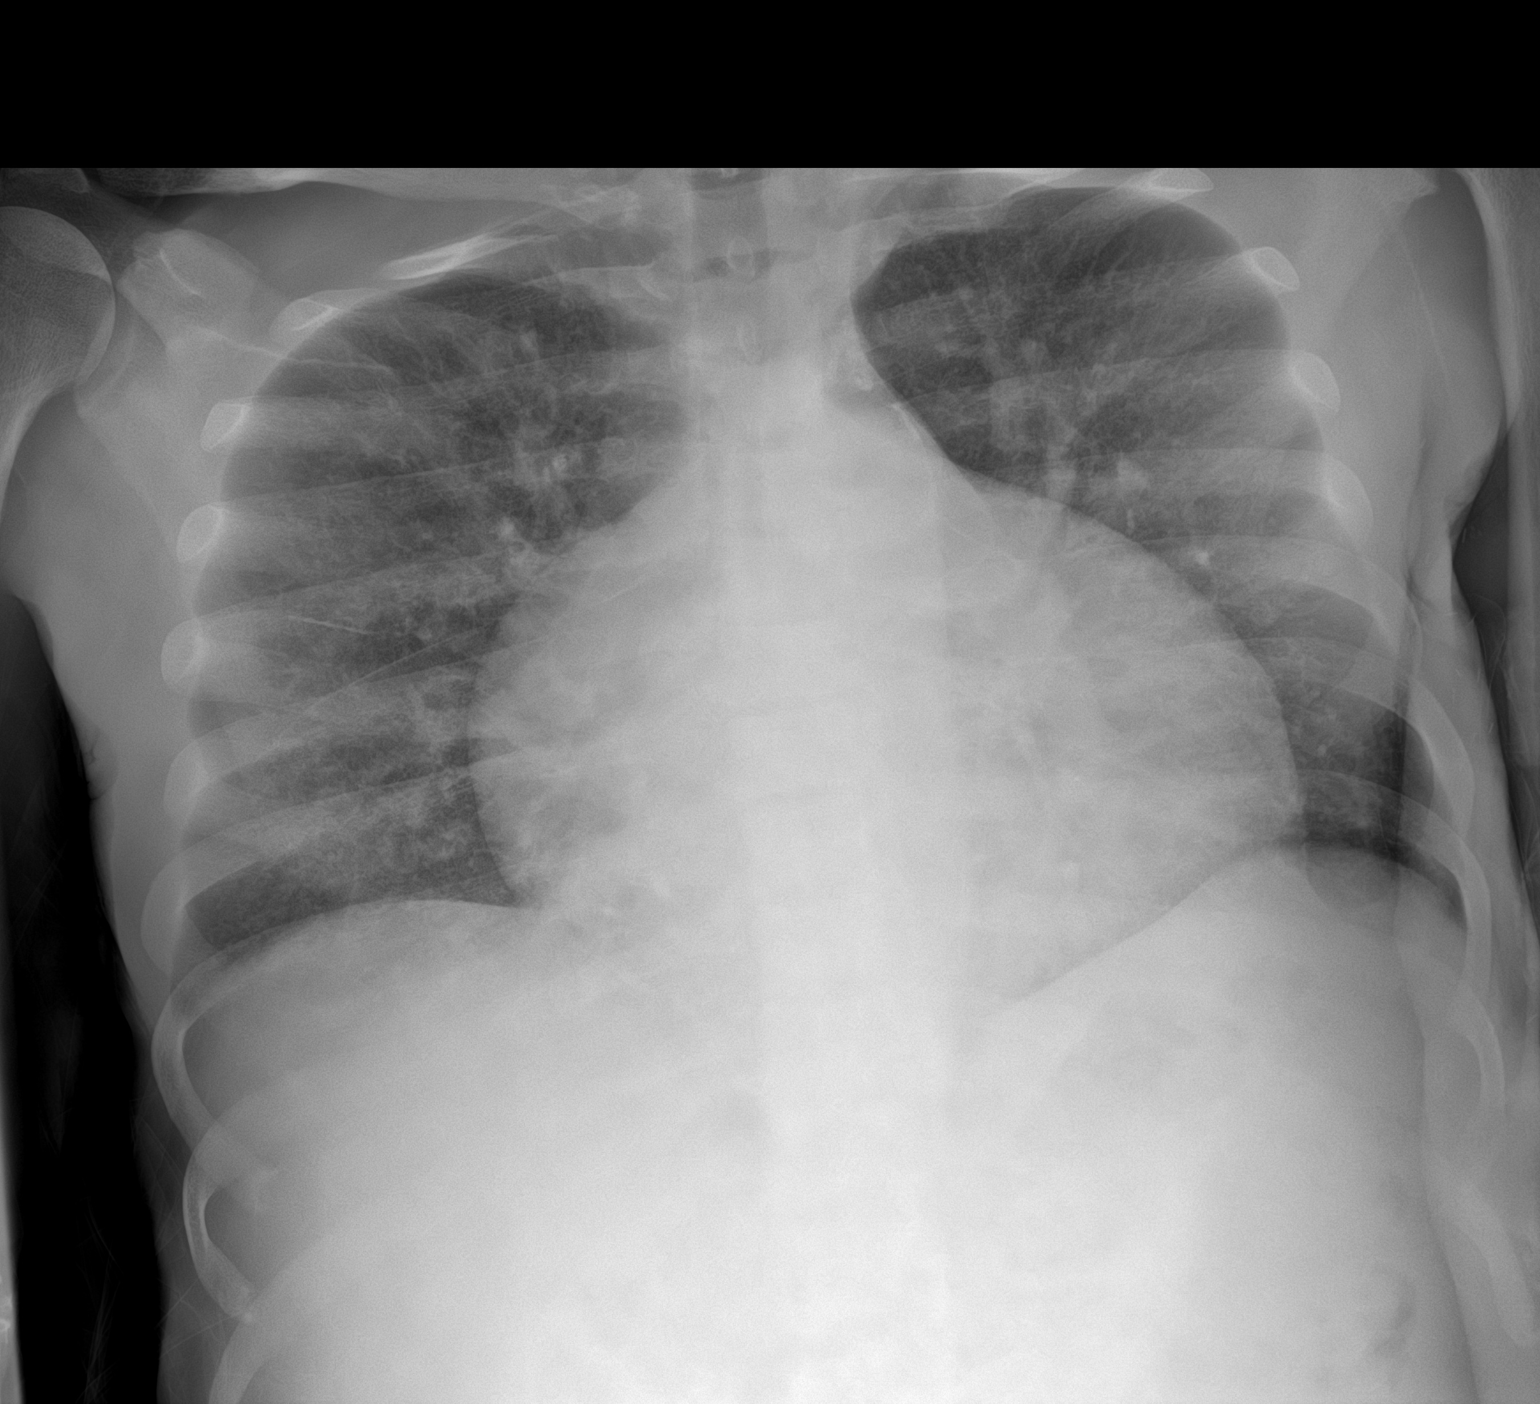

[1 of 1 positions shown; findings below may reference images not displayed]

FINDINGS: Cardiac diameter unchanged from the prior plain film, with
configuration compatible with pericardial effusion.

Thickening of the minor fissure.  No pleural effusion.

Coarsened interstitial markings, similar to the prior chest x-ray.
Minimal interlobular septal thickening. No pneumothorax.

No confluent airspace disease.

No displaced fracture
IMPRESSION: Cardiomegaly compatible with generous pericardial effusion, present
on prior CT chest.

Similar appearance of the lungs, with reticulonodular opacities,
with differential including both atypical infection as well as mild
pulmonary edema. No evidence of pleural effusion.

## 2021-05-04 MED ORDER — HYDROXYZINE HCL 25 MG PO TABS: 25.0000 mg | ORAL_TABLET | Freq: Three times a day (TID) | ORAL | 0 refills | Status: DC

## 2021-05-04 MED ORDER — ALBUTEROL SULFATE HFA 108 (90 BASE) MCG/ACT IN AERS: 2.0000 | INHALATION_SPRAY | RESPIRATORY_TRACT | 0 refills | Status: DC | PRN

## 2021-05-04 MED ORDER — CINACALCET HCL 30 MG PO TABS: 30.0000 mg | ORAL_TABLET | Freq: Every day | ORAL | 0 refills | Status: DC

## 2021-05-04 MED ORDER — GUAIFENESIN ER 600 MG PO TB12: 600.0000 mg | ORAL_TABLET | Freq: Two times a day (BID) | ORAL | 0 refills | Status: DC

## 2021-05-04 NOTE — Progress Notes (Signed)
DISCHARGE NOTE HOME Jordan [name] to be discharged Home per MD order. Discussed prescriptions and follow up appointments with the patient. Prescriptions given to patient; medication list explained in detail. Patient verbalized understanding.  Skin clean, dry and intact without evidence of skin break down, no evidence of skin tears noted. IV catheter discontinued intact. Site without signs and symptoms of complications. Dressing and pressure applied. Pt denies pain at the site currently. No complaints noted.  Patient free of lines, drains, and wounds.   An After Visit Summary (AVS) was printed and given to the patient. Patient escorted via wheelchair, and discharged home via private auto.  [place], Zenon Mayo, RN

## 2021-05-04 NOTE — Progress Notes (Signed)
Bleeding stopped from the old scab over wound that the patient picked earlier this morning.

## 2021-05-04 NOTE — Discharge Summary (Signed)
Physician Discharge Summary  Jordan [name] [id number] DOB: [birth year] DOA: 04/29/2021  PCP: Merryl Hacker, No  Admit date: 04/29/2021 Discharge date: 05/04/2021  Admitted From: Home Disposition: Home  Recommendations for Outpatient Follow-up:  Follow up with PCP in 1 week with repeat CBC/BMP Outpatient follow-up with dialysis unit as scheduled Comply with medications and follow-up Completed course of isolation for at least 10 days  follow up in ED if symptoms worsen or new appear   Home Health: No Equipment/Devices: None  Discharge Condition: Stable CODE STATUS: Full Diet recommendation: Heart healthy/renal hemodialysis diet  Brief/Interim Summary: 55 year old male with history of end-stage renal disease on hemodialysis, COPD, alcohol abuse, anemia, hypertension, chronic diastolic CHF, tobacco abuse, A. fib presented with not feeling well, itching and not being able to afford Atarax and subsequently missed hemodialysis.  On presentation, he was found to be COVID-positive and was treated with monoclonal antibody.  He was found to have pulmonary edema.  Nephrology was consulted. Nephrology following and patient had dialysis during this hospitalization. Subsequently, volume overload has improved.  Nephrology has cleared the patient for discharge home today.  He will be discharged home today with outpatient follow-up with PCP and dialysis unit as scheduled    Discharge Diagnoses:   End-stage renal disease on hemodialysis Pulmonary edema Acute on chronic combined systolic and diastolic CHF -Patient presented with volume overload after missing his hemodialysis.  Respiratory status currently stable. -Nephrology following and patient had dialysis during this hospitalization. -Subsequently, volume overload has improved.  Nephrology has cleared the patient for discharge home today.  Discharge home today.  Outpatient follow-up with dialysis as scheduled.  COVID-19 infection Chronic hypoxic  respiratory failure/COPD -Probably incidental finding.  Patient is vaccinated and boosted.  Respiratory status stable  -Inflammatory markers are elevated but doubt that patient needs further treatment for COVID-19.  Received a dose of monoclonal antibody on presentation. -COVID-19 Labs   Recent Labs (last 2 labs)        Recent Labs    04/29/21 2159 04/30/21 0512 05/01/21 0338  DDIMER 3.15* 5.89* 5.62*  FERRITIN 939* 857* 989*  LDH 310* 371*  --   CRP 17.0* 15.6*  --          Recent Labs       Lab Results  Component Value Date    SARSCOV2NAA POSITIVE (A) 04/29/2021    SARSCOV2NAA NEGATIVE 04/08/2021    [place] NEGATIVE 03/28/2021    [place] NEGATIVE 03/25/2021      -Complete at least a total of 10-day course of isolation. -CTA was negative for PE despite D-dimer being significantly elevated.  Leukocytosis -Resolved  Thrombocytopenia -Chronic.  Stable.  No signs of bleeding  Anemia of chronic disease -Hemoglobin stable.  Outpatient follow-up.  Paroxysmal A. fib -Currently rate controlled.  Not on anticoagulation as an outpatient.  Essential hypertension -Blood pressure intermittently elevated.  Outpatient follow-up with  Alcohol abuse -In remission  Pruritus -Chronic.  Continue Atarax   Chronic tobacco use--patient was counseled regarding tobacco cessation during this admission  Discharge Instructions  Discharge Instructions     Diet - low sodium heart healthy   Complete by: As directed    Increase activity slowly   Complete by: As directed       Allergies as of 05/04/2021       Reactions   Lisinopril Swelling        Medication List     STOP taking these medications    ALPRAZolam 0.25 MG tablet Commonly known as: Duanne Moron  Auryxia 1 GM 210 MG(Fe) tablet Generic drug: ferric citrate   fluticasone 50 MCG/ACT nasal spray Commonly known as: FLONASE   hydrOXYzine 50 MG capsule Commonly known as: Vistaril   Velphoro 500 MG  chewable tablet Generic drug: sucroferric oxyhydroxide   witch hazel-glycerin pad Commonly known as: TUCKS       TAKE these medications    albuterol 108 (90 Base) MCG/ACT inhaler Commonly known as: VENTOLIN HFA Inhale 2 puffs into the lungs every 4 (four) hours as needed for wheezing or shortness of breath.   calcium acetate 667 MG capsule Commonly known as: PHOSLO Take 2 capsules (1,334 mg total) by mouth 3 (three) times daily with meals.   cinacalcet 30 MG tablet Commonly known as: SENSIPAR Take 1 tablet (30 mg total) by mouth daily with breakfast. What changed:  medication strength See the new instructions.   epoetin alfa 4000 UNIT/ML injection Commonly known as: EPOGEN Inject 1 mL (4,000 Units total) into the vein every Monday, Wednesday, and Friday with hemodialysis.   guaiFENesin 600 MG 12 hr tablet Commonly known as: MUCINEX Take 1 tablet (600 mg total) by mouth 2 (two) times daily.   hydrALAZINE 50 MG tablet Commonly known as: APRESOLINE Take 50 mg by mouth 3 (three) times daily.   hydrOXYzine 25 MG tablet Commonly known as: ATARAX/VISTARIL Take 1 tablet (25 mg total) by mouth 3 (three) times daily.   Lidocaine 3 % Crea Apply 1 application topically 2 (two) times daily as needed (pain).   thiamine 100 MG tablet Take 1 tablet (100 mg total) by mouth daily.   triamcinolone cream 0.1 % Commonly known as: KENALOG Apply 1 application topically 2 (two) times daily.        Follow-up Information     PCP. Schedule an appointment as soon as possible for a visit in 1 week(s).                 Allergies  Allergen Reactions   Lisinopril Swelling    Consultations: Nephrology   Procedures/Studies: DG Chest 2 View  Result Date: 04/29/2021 CLINICAL DATA:  Shortness of breath EXAM: CHEST - 2 VIEW COMPARISON:  Chest x-ray dated April 26, 2021 FINDINGS: Cardiomegaly, apparent decreased size of the cardiac contours is likely related to differences in  technique. Unchanged bilateral interstitial opacities. More focal mild bibasilar opacities are seen likely due to atelectasis. No large pleural effusion or evidence of pneumothorax. IMPRESSION: Unchanged pulmonary edema. Cardiomegaly. Electronically Signed   By: Yetta Glassman MD   On: 04/29/2021 10:53   CT Angio Chest Pulmonary Embolism (PE) W or WO Contrast  Result Date: 05/03/2021 CLINICAL DATA:  PE suspected, elevated D-dimer, COVID EXAM: CT ANGIOGRAPHY CHEST WITH CONTRAST TECHNIQUE: Multidetector CT imaging of the chest was performed using the standard protocol during bolus administration of intravenous contrast. Multiplanar CT image reconstructions and MIPs were obtained to evaluate the vascular anatomy. CONTRAST:  58m OMNIPAQUE IOHEXOL 350 MG/ML SOLN COMPARISON:  02/26/2021 FINDINGS: Cardiovascular: Satisfactory opacification of the pulmonary arteries to the segmental level. No evidence of pulmonary embolism. Cardiomegaly. Extensive 3 vessel coronary artery calcifications and/or stents. Moderate pericardial effusion. Aortic atherosclerosis Mediastinum/Nodes: No enlarged mediastinal, hilar, or axillary lymph nodes. Thyroid gland, trachea, and esophagus demonstrate no significant findings. Lungs/Pleura: Diffuse bilateral ground-glass airspace attenuation, superimposed upon moderate centrilobular emphysema. No pleural effusion or pneumothorax. Upper Abdomen: Moderate volume ascites in the included upper abdomen. Somewhat coarse contour of the included liver. Musculoskeletal: No chest wall abnormality.  Renal osteodystrophy. Review of  the MIP images confirms the above findings. IMPRESSION: 1. Negative examination for pulmonary embolism. 2. Diffuse bilateral ground-glass airspace attenuation, superimposed upon moderate centrilobular emphysema. Although potentially in keeping with COVID airspace disease, this appearance is generally nonspecific and may reflect a component of pulmonary edema. 3. Cardiomegaly  and coronary artery disease. 4. Moderate pericardial effusion. 5. Moderate volume ascites in the included upper abdomen. 6. Somewhat coarse contour of the included liver, suggestive of cirrhosis. Aortic Atherosclerosis (ICD10-I70.0) and Emphysema (ICD10-J43.9). Electronically Signed   By: Eddie Candle M.D.   On: 05/03/2021 10:20   CT ABDOMEN PELVIS W CONTRAST  Result Date: 04/26/2021 CLINICAL DATA:  Abdominal distension and pain. EXAM: CT ABDOMEN AND PELVIS WITH CONTRAST TECHNIQUE: Multidetector CT imaging of the abdomen and pelvis was performed using the standard protocol following bolus administration of intravenous contrast. CONTRAST:  53m OMNIPAQUE IOHEXOL 300 MG/ML  SOLN COMPARISON:  01/13/2021 FINDINGS: Lower chest: Small pericardial effusion, new. Mild opacification at the bases, suspect early edema. Extensive arterial calcification. Cardiomegaly. Hepatobiliary: Distended hepatic veins. No focal abnormality. No evidence of biliary obstruction or stone. Pancreas: Unremarkable. Spleen: Unremarkable. Adrenals/Urinary Tract: Negative adrenals. End-stage renal disease with symmetric advanced renal atrophy. Collapsed urinary bladder. Stomach/Bowel:  No obstruction. 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 Confluent atheromatous plaque on the aorta and multiple branch vessels. No mass or adenopathy. Reproductive:No acute finding Other: Large volume ascites, increased.  Increased anasarca. Musculoskeletal: Generalized sclerosis compatible with renal osteodystrophy. IMPRESSION: Worsening volume overload with large volume ascites, prominent anasarca, and new small pericardial effusion. Electronically Signed   By: JMonte FantasiaM.D.   On: 04/26/2021 07:34   DG Chest Portable 1 View  Result Date: 04/26/2021 CLINICAL DATA:  55year old male with history of shortness of breath. EXAM: PORTABLE CHEST 1 VIEW COMPARISON:  Chest x-ray 04/08/2021.  FINDINGS: Lung volumes are normal. No confluent consolidative airspace disease. No pleural effusions. There is cephalization of the pulmonary vasculature and slight indistinctness of the interstitial markings suggestive of mild pulmonary edema. Mild to moderate cardiomegaly. Upper mediastinal contours are within normal limits. Atherosclerotic calcifications in the thoracic aorta. IMPRESSION: 1. The appearance the chest suggests mild congestive heart failure, as above. Electronically Signed   By: DVinnie LangtonM.D.   On: 04/26/2021 05:00   DG Chest Port 1 View  Result Date: 04/08/2021 CLINICAL DATA:  Shortness of breath EXAM: PORTABLE CHEST 1 VIEW COMPARISON:  03/28/2021 FINDINGS: Bilateral diffuse mild interstitial thickening. No focal consolidation. No pleural effusion or pneumothorax. Stable cardiomegaly. No acute osseous abnormality. IMPRESSION: Bilateral diffuse interstitial thickening and cardiomegaly concerning for mild pulmonary edema. Electronically Signed   By: HKathreen Devoid  On: 04/08/2021 13:54   VAS [place]OWER EXTREMITY VENOUS (DVT)  Result Date: 05/01/2021  Lower Venous DVT Study Patient Name:  CED ANNESS Date of Exam:   05/01/2021 Medical Rec #: [id number]        Accession #:    [id number] of Birth: 110/06/1966        Patient Gender: M Patient Age:   89years [facility]      VAS [place]OWER EXTREMITY VENOUS (DVT) Referring Phys: FAnnamaria BootsXU --------------------------------------------------------------------------------  Indications: Edema of left leg, Covid-19.  Limitations: Posterior acoustic shadowing due to overlying arterial calcification, tissue properties due to subcutaneous edema, and patient positioning. Comparison Study: No prior studies. Performing Technologist: RDarlin CocoRDMS,RVT  Examination Guidelines: A complete evaluation includes B-mode  imaging, spectral Doppler, color Doppler, and power Doppler as needed of all accessible portions of  each vessel. Bilateral testing is considered an integral part of a complete examination. Limited examinations for reoccurring indications may be performed as noted. The reflux portion of the exam is performed with the patient in reverse Trendelenburg.  +-----+---------------+---------+-----------+----------+--------------+ RIGHTCompressibilityPhasicitySpontaneityPropertiesThrombus Aging +-----+---------------+---------+-----------+----------+--------------+ CFV  Full           No       Yes                                 +-----+---------------+---------+-----------+----------+--------------+   +---------+---------------+---------+-----------+----------+--------------+ LEFT     CompressibilityPhasicitySpontaneityPropertiesThrombus Aging +---------+---------------+---------+-----------+----------+--------------+ CFV      Full           No       Yes                                 +---------+---------------+---------+-----------+----------+--------------+ SFJ      Full                                                        +---------+---------------+---------+-----------+----------+--------------+ FV Prox  Full                                                        +---------+---------------+---------+-----------+----------+--------------+ FV Mid   Full                                                        +---------+---------------+---------+-----------+----------+--------------+ FV DistalFull                                                        +---------+---------------+---------+-----------+----------+--------------+ PFV      Full                                                        +---------+---------------+---------+-----------+----------+--------------+ POP      Full           No       Yes                                 +---------+---------------+---------+-----------+----------+--------------+ PTV      Full                                                         +---------+---------------+---------+-----------+----------+--------------+  PERO     Full                                                        +---------+---------------+---------+-----------+----------+--------------+ Gastroc  Full                                                        +---------+---------------+---------+-----------+----------+--------------+    Summary: RIGHT: - No evidence of common femoral vein obstruction. -  LEFT: - There is no evidence of deep vein thrombosis in the lower extremity.  - No cystic structure found in the popliteal fossa.  - Pulsatile venous waveforms observed throughout extremity, suggestive of elevated right heart pressures.  *See table(s) above for measurements and observations. Electronically signed by Harold Barban MD on 05/01/2021 at 6:54:30 PM.    Final       Subjective: Patient seen and examined at bedside.  Poor historian.  Denies worsening shortness of breath, fever, vomiting.  He was to go home.  Discharge Exam: Vitals:   05/04/21 0558 05/04/21 0936  BP: (!) 144/87 115/78  Pulse: (!) 108 (!) 104  Resp: 20 16  Temp: 98 F (36.7 C) (!) 97.3 F (36.3 C)  SpO2: 97% 96%    General exam: On 2 L oxygen by nasal.  No distress.  Looks chronically ill and deconditioned.  Poor historian.  Flat affect. Respiratory system: Decreased breath sounds at bases bilaterally with some crackles.   Cardiovascular system: Tachycardic; S1-S2 heard  gastrointestinal system: Abdomen is distended slightly; soft and nontender.  Bowel sounds heard  extremities: Bilateral lower extremity edema present; no clubbing     The results of significant diagnostics from this hospitalization (including imaging, microbiology, ancillary and laboratory) are listed below for reference.     Microbiology: Recent Results (from the past 240 hour(s))  Resp Panel by RT-PCR (Flu A&B, Covid) Nasopharyngeal Swab     Status: Abnormal    Collection Time: 04/29/21  8:03 PM   Specimen: Nasopharyngeal Swab; Nasopharyngeal(NP) swabs in vial transport medium  Result Value Ref Range Status   SARS Coronavirus 2 by RT PCR POSITIVE (A) NEGATIVE Final    Comment: RESULT CALLED TO, READ BACK BY AND VERIFIED WITH: J FERRAINOL,RN'@2120'$  04/29/21 [place] (NOTE) SARS-CoV-2 target nucleic acids are DETECTED.  The SARS-CoV-2 RNA is generally detectable in upper respiratory specimens during the acute phase of infection. Positive results are indicative of the presence of the identified virus, but do not rule out bacterial infection or co-infection with other pathogens not detected by the test. Clinical correlation with patient history and other diagnostic information is necessary to determine patient infection status. The expected result is Negative.  Fact Sheet for Patients: EntrepreneurPulse.com.au  Fact Sheet for Healthcare Providers: IncredibleEmployment.be  This test is not yet approved or cleared by the [place] FDA and  has been authorized for detection and/or diagnosis of SARS-CoV-2 by FDA under an Emergency Use Authorization (EUA).  This EUA will remain in effect (meaning this test can be u sed) for the duration of  the COVID-19 declaration under Section 564(b)(1) of the Act, 21 U.S.C. section 360bbb-3(b)(1), unless the authorization is terminated or revoked sooner.  Influenza A by PCR NEGATIVE NEGATIVE Final   Influenza B by PCR NEGATIVE NEGATIVE Final    Comment: (NOTE) The Xpert Xpress SARS-CoV-2/FLU/RSV plus assay is intended as an aid in the diagnosis of influenza from Nasopharyngeal swab specimens and should not be used as a sole basis for treatment. Nasal washings and aspirates are unacceptable for Xpert Xpress SARS-CoV-2/FLU/RSV testing.  Fact Sheet for Patients: EntrepreneurPulse.com.au  Fact Sheet for Healthcare  Providers: IncredibleEmployment.be  This test is not yet approved or cleared by the [place] FDA and has been authorized for detection and/or diagnosis of SARS-CoV-2 by FDA under an Emergency Use Authorization (EUA). This EUA will remain in effect (meaning this test can be used) for the duration of the COVID-19 declaration under Section 564(b)(1) of the Act, 21 U.S.C. section 360bbb-3(b)(1), unless the authorization is terminated or revoked.  Performed at [facility], [place] [address], [place], [place] [address]   Blood Culture (routine x 2)     Status: None   Collection Time: 04/29/21 10:40 PM   Specimen: BLOOD RIGHT FOREARM  Result Value Ref Range Status   Specimen Description BLOOD RIGHT FOREARM  Final   Special Requests   Final    BOTTLES DRAWN AEROBIC AND ANAEROBIC Blood Culture results may not be optimal due to an inadequate volume of blood received in culture bottles   Culture   Final    NO GROWTH 5 DAYS Performed at [facility], [place] [address], [place], [place] [address]    Report Status 05/04/2021 FINAL  Final  Blood Culture (routine x 2)     Status: None   Collection Time: 04/29/21 10:48 PM   Specimen: BLOOD RIGHT HAND  Result Value Ref Range Status   Specimen Description BLOOD RIGHT HAND  Final   Special Requests   Final    BOTTLES DRAWN AEROBIC AND ANAEROBIC Blood Culture results may not be optimal due to an inadequate volume of blood received in culture bottles   Culture   Final    NO GROWTH 5 DAYS Performed at [facility], [place] [address] [place], [place] [address]    Report Status 05/04/2021 FINAL  Final     Labs: BNP (last 3 results) Recent Labs    02/26/21 1700 03/10/21 2128 04/08/21 1149  BNP 2,730.3* 2,267.3* [id number]*   Basic Metabolic Panel: Recent Labs  Lab 04/29/21 1030 04/30/21 0512 05/01/21 0338 05/03/21 0426  NA 140 137 136 136  K 4.1 4.6 4.5 4.0  CL 98 98 96* 96*  CO2 24 16* 20* 23  GLUCOSE  84 94 89 81  BUN 89* 99* 103* 70*  CREATININE 16.64* 16.89* 17.30* 12.27*  CALCIUM 8.9 8.3* 8.2* 8.1*  MG  --  2.4 2.4 2.2  PHOS  --  8.5* 7.8*  --    Liver Function Tests: Recent Labs  Lab 04/29/21 1030 04/30/21 0512 05/01/21 0338 05/03/21 0426  AST '21 24 23 24  '$ ALT '14 13 16 17  '$ ALKPHOS 98 118 116 118  BILITOT 1.2 0.9 1.0 0.8  PROT 7.2 6.7 7.3 7.2  ALBUMIN 2.8* 2.8* 3.0* 2.8*   No results for input(s): LIPASE, AMYLASE in the last 168 hours. No results for input(s): AMMONIA in the last 168 hours. CBC: Recent Labs  Lab 04/29/21 1030 05/01/21 0338 05/03/21 0426  WBC 16.8* 8.9 7.0  NEUTROABS 15.2* 7.2 5.2  HGB 7.5* 7.5* 7.1*  HCT 23.7* 23.6* 22.6*  MCV 99.2 99.2 99.1  PLT 93*  93* 105*   Cardiac Enzymes: No results for input(s): CKTOTAL, CKMB, CKMBINDEX, TROPONINI in the last 168 hours. BNP: Invalid input(s): POCBNP CBG: No results for input(s): GLUCAP in the last 168 hours. D-Dimer Recent Labs    05/03/21 0426  DDIMER 14.61*   Hgb A1c No results for input(s): HGBA1C in the last 72 hours. Lipid Profile No results for input(s): CHOL, HDL, LDLCALC, TRIG, CHOLHDL, LDLDIRECT in the last 72 hours. Thyroid function studies No results for input(s): TSH, T4TOTAL, T3FREE, THYROIDAB in the last 72 hours.  Invalid input(s): FREET3 Anemia work up Recent Labs    05/03/21 0426  FERRITIN 985*   Urinalysis    Component Value Date/Time   COLORURINE YELLOW (A) 01/13/2021 0153   APPEARANCEUR CLEAR (A) 01/13/2021 0153   LABSPEC 1.012 01/13/2021 0153   PHURINE 9.0 (H) 01/13/2021 0153   GLUCOSEU 150 (A) 01/13/2021 0153   HGBUR NEGATIVE 01/13/2021 0153   BILIRUBINUR NEGATIVE 01/13/2021 0153   KETONESUR NEGATIVE 01/13/2021 0153   PROTEINUR >=300 (A) 01/13/2021 0153   NITRITE NEGATIVE 01/13/2021 0153   LEUKOCYTESUR NEGATIVE 01/13/2021 0153   Sepsis Labs Invalid input(s): PROCALCITONIN,  WBC,  LACTICIDVEN Microbiology Recent Results (from the past 240 hour(s))  Resp  Panel by RT-PCR (Flu A&B, Covid) Nasopharyngeal Swab     Status: Abnormal   Collection Time: 04/29/21  8:03 PM   Specimen: Nasopharyngeal Swab; Nasopharyngeal(NP) swabs in vial transport medium  Result Value Ref Range Status   SARS Coronavirus 2 by RT PCR POSITIVE (A) NEGATIVE Final    Comment: RESULT CALLED TO, READ BACK BY AND VERIFIED WITH: J FERRAINOL,RN'@2120'$  04/29/21 [place] (NOTE) SARS-CoV-2 target nucleic acids are DETECTED.  The SARS-CoV-2 RNA is generally detectable in upper respiratory specimens during the acute phase of infection. Positive results are indicative of the presence of the identified virus, but do not rule out bacterial infection or co-infection with other pathogens not detected by the test. Clinical correlation with patient history and other diagnostic information is necessary to determine patient infection status. The expected result is Negative.  Fact Sheet for Patients: EntrepreneurPulse.com.au  Fact Sheet for Healthcare Providers: IncredibleEmployment.be  This test is not yet approved or cleared by the [place] FDA and  has been authorized for detection and/or diagnosis of SARS-CoV-2 by FDA under an Emergency Use Authorization (EUA).  This EUA will remain in effect (meaning this test can be u sed) for the duration of  the COVID-19 declaration under Section 564(b)(1) of the Act, 21 U.S.C. section 360bbb-3(b)(1), unless the authorization is terminated or revoked sooner.     Influenza A by PCR NEGATIVE NEGATIVE Final   Influenza B by PCR NEGATIVE NEGATIVE Final    Comment: (NOTE) The Xpert Xpress SARS-CoV-2/FLU/RSV plus assay is intended as an aid in the diagnosis of influenza from Nasopharyngeal swab specimens and should not be used as a sole basis for treatment. Nasal washings and aspirates are unacceptable for Xpert Xpress SARS-CoV-2/FLU/RSV testing.  Fact Sheet for  Patients: EntrepreneurPulse.com.au  Fact Sheet for Healthcare Providers: IncredibleEmployment.be  This test is not yet approved or cleared by the [place] FDA and has been authorized for detection and/or diagnosis of SARS-CoV-2 by FDA under an Emergency Use Authorization (EUA). This EUA will remain in effect (meaning this test can be used) for the duration of the COVID-19 declaration under Section 564(b)(1) of the Act, 21 U.S.C. section 360bbb-3(b)(1), unless the authorization is terminated or revoked.  Performed at [facility], [place] [address] [place], [place]  [address]   Blood Culture (routine x 2)     Status: None   Collection Time: 04/29/21 10:40 PM   Specimen: BLOOD RIGHT FOREARM  Result Value Ref Range Status   Specimen Description BLOOD RIGHT FOREARM  Final   Special Requests   Final    BOTTLES DRAWN AEROBIC AND ANAEROBIC Blood Culture results may not be optimal due to an inadequate volume of blood received in culture bottles   Culture   Final    NO GROWTH 5 DAYS Performed at [facility], [place] [address] [place], [place] [address]    Report Status 05/04/2021 FINAL  Final  Blood Culture (routine x 2)     Status: None   Collection Time: 04/29/21 10:48 PM   Specimen: BLOOD RIGHT HAND  Result Value Ref Range Status   Specimen Description BLOOD RIGHT HAND  Final   Special Requests   Final    BOTTLES DRAWN AEROBIC AND ANAEROBIC Blood Culture results may not be optimal due to an inadequate volume of blood received in culture bottles   Culture   Final    NO GROWTH 5 DAYS Performed at [facility], [place] [address] [place], [place] [address]    Report Status 05/04/2021 FINAL  Final     Time coordinating discharge: 35 minutes  SIGNED:   Aline August, MD  [facility] 05/04/2021, 1:28 PM

## 2021-05-04 NOTE — Progress Notes (Signed)
Patient ID: Jordan [name], male   DOB: 08/08/1966, 55 y.o.   MRN: [id number]  PROGRESS NOTE    WINDEL TENOLD  [id number] DOB: 01-17-1966 DOA: 04/29/2021 PCP: Pcp, No   Brief Narrative:  55 year old male with history of end-stage renal disease on hemodialysis, COPD, alcohol abuse, anemia, hypertension, chronic diastolic CHF, tobacco abuse, A. fib presented with not feeling well, itching and not being able to afford Atarax and subsequently missed hemodialysis.  On presentation, he was found to be COVID-positive and was treated with monoclonal antibody.  He was found to have pulmonary edema.  Nephrology was consulted.  Assessment & Plan:   End-stage renal disease on hemodialysis Pulmonary edema Acute on chronic combined systolic and diastolic CHF -Patient presented with volume overload after missing his hemodialysis.  Respiratory status currently stable. -Nephrology following and patient still requiring inpatient hemodialysis for volume overload.  COVID-19 infection Chronic hypoxic respiratory failure/COPD -Probably incidental finding.  Patient is vaccinated and boosted.  Respiratory status stable  -Inflammatory markers are elevated but doubt that patient needs further treatment for COVID-19.  Received a dose of monoclonal antibody on presentation. -COVID-19 Labs  Recent Labs    05/03/21 0426  DDIMER 14.61*  FERRITIN 985*     Lab Results  Component Value Date   SARSCOV2NAA POSITIVE (A) 04/29/2021   SARSCOV2NAA NEGATIVE 04/08/2021   [place] NEGATIVE 03/28/2021   [place] NEGATIVE 03/25/2021   -Continue isolation -CTA was negative for PE despite D-dimer being significantly elevated.  Leukocytosis -Resolved  Thrombocytopenia -Chronic.  Stable.  No signs of bleeding  Anemia of chronic disease -Hemoglobin 7.1 on 05/03/2021.  Transfuse if hemoglobin is less than 7  Paroxysmal A. fib -Currently rate controlled.  Not on anticoagulation as an  outpatient.  Essential hypertension -Blood pressure intermittently elevated.  Continue hydralazine  Alcohol abuse In remission  Pruritus -Chronic.  Continue Atarax  Chronic tobacco use--continue nicotine patch    DVT prophylaxis: SCDs Code Status: Full Family Communication: None at bedside Disposition Plan: Status is: Inpatient  Remains inpatient appropriate because:Inpatient level of care appropriate due to severity of illness  Dispo: The patient is from: Home              Anticipated d/c is to: Home              Patient currently is not medically stable to d/c.   Difficult to place patient No  Consultants: Nephrology  Procedures: None  Antimicrobials: None   Subjective: Patient seen and examined at bedside.  Poor historian.  Denies worsening shortness of breath, fever, vomiting.  Still has intermittent cough.   Objective: Vitals:   05/03/21 2330 05/04/21 0000 05/04/21 0036 05/04/21 0558  BP: 125/83 (!) 147/107 138/86 (!) 144/87  Pulse: (!) 101 (!) 101 (!) 102 (!) 108  Resp: '16 20  20  '$ Temp:  98.2 F (36.8 C) 98.6 F (37 C) 98 F (36.7 C)  TempSrc:  Oral Oral Oral  SpO2: 95% 100% 100% 97%  Weight:    71.3 kg  Height:        Intake/Output Summary (Last 24 hours) at 05/04/2021 0756 Last data filed at 05/04/2021 0201 Gross per 24 hour  Intake 240 ml  Output 4000 ml  Net -3760 ml    Filed Weights   05/02/21 2120 05/03/21 2000 05/04/21 0558  Weight: 75.3 kg 75 kg 71.3 kg    Examination:  General exam: On 2 L oxygen by nasal.  No distress.  Looks chronically ill and  deconditioned.   Respiratory system: Decreased breath sounds at bases bilaterally with some crackles.   Cardiovascular system: Tachycardic; S1-S2 heard  gastrointestinal system: Abdomen is distended slightly; soft and nontender.  Bowel sounds heard  extremities: Bilateral lower extremity edema present; no clubbing Central nervous system: Slow to respond.  Poor historian.  No focal  neurological deficits.  Moves extremities skin: No obvious ecchymosis/lesions psychiatry: Extremely flat affect   data Reviewed: I have personally reviewed following labs and imaging studies  CBC: Recent Labs  Lab 04/29/21 1030 05/01/21 0338 05/03/21 0426  WBC 16.8* 8.9 7.0  NEUTROABS 15.2* 7.2 5.2  HGB 7.5* 7.5* 7.1*  HCT 23.7* 23.6* 22.6*  MCV 99.2 99.2 99.1  PLT 93* 93* 105*    Basic Metabolic Panel: Recent Labs  Lab 04/29/21 1030 04/30/21 0512 05/01/21 0338 05/03/21 0426  NA 140 137 136 136  K 4.1 4.6 4.5 4.0  CL 98 98 96* 96*  CO2 24 16* 20* 23  GLUCOSE 84 94 89 81  BUN 89* 99* 103* 70*  CREATININE 16.64* 16.89* 17.30* 12.27*  CALCIUM 8.9 8.3* 8.2* 8.1*  MG  --  2.4 2.4 2.2  PHOS  --  8.5* 7.8*  --     GFR: Estimated Creatinine Clearance: 6.3 mL/min (A) (by C-G formula based on SCr of 12.27 mg/dL (H)). Liver Function Tests: Recent Labs  Lab 04/29/21 1030 04/30/21 0512 05/01/21 0338 05/03/21 0426  AST '21 24 23 24  '$ ALT '14 13 16 17  '$ ALKPHOS 98 118 116 118  BILITOT 1.2 0.9 1.0 0.8  PROT 7.2 6.7 7.3 7.2  ALBUMIN 2.8* 2.8* 3.0* 2.8*    No results for input(s): LIPASE, AMYLASE in the last 168 hours. No results for input(s): AMMONIA in the last 168 hours. Coagulation Profile: No results for input(s): INR, PROTIME in the last 168 hours. Cardiac Enzymes: No results for input(s): CKTOTAL, CKMB, CKMBINDEX, TROPONINI in the last 168 hours. BNP (last 3 results) No results for input(s): PROBNP in the last 8760 hours. HbA1C: No results for input(s): HGBA1C in the last 72 hours. CBG: No results for input(s): GLUCAP in the last 168 hours. Lipid Profile: No results for input(s): CHOL, HDL, LDLCALC, TRIG, CHOLHDL, LDLDIRECT in the last 72 hours.  Thyroid Function Tests: No results for input(s): TSH, T4TOTAL, FREET4, T3FREE, THYROIDAB in the last 72 hours.  Anemia Panel: Recent Labs    05/03/21 0426  FERRITIN 985*    Sepsis Labs: Recent Labs  Lab  04/29/21 2159 04/30/21 0512  PROCALCITON 21.91  --   LATICACIDVEN 2.5* 2.2*     Recent Results (from the past 240 hour(s))  Resp Panel by RT-PCR (Flu A&B, Covid) Nasopharyngeal Swab     Status: Abnormal   Collection Time: 04/29/21  8:03 PM   Specimen: Nasopharyngeal Swab; Nasopharyngeal(NP) swabs in vial transport medium  Result Value Ref Range Status   SARS Coronavirus 2 by RT PCR POSITIVE (A) NEGATIVE Final    Comment: RESULT CALLED TO, READ BACK BY AND VERIFIED WITH: J FERRAINOL,RN'@2120'$  04/29/21 [place] (NOTE) SARS-CoV-2 target nucleic acids are DETECTED.  The SARS-CoV-2 RNA is generally detectable in upper respiratory specimens during the acute phase of infection. Positive results are indicative of the presence of the identified virus, but do not rule out bacterial infection or co-infection with other pathogens not detected by the test. Clinical correlation with patient history and other diagnostic information is necessary to determine patient infection status. The expected result is Negative.  Fact Sheet for Patients: EntrepreneurPulse.com.au  Fact Sheet for Healthcare Providers: IncredibleEmployment.be  This test is not yet approved or cleared by the [place] FDA and  has been authorized for detection and/or diagnosis of SARS-CoV-2 by FDA under an Emergency Use Authorization (EUA).  This EUA will remain in effect (meaning this test can be u sed) for the duration of  the COVID-19 declaration under Section 564(b)(1) of the Act, 21 U.S.C. section 360bbb-3(b)(1), unless the authorization is terminated or revoked sooner.     Influenza A by PCR NEGATIVE NEGATIVE Final   Influenza B by PCR NEGATIVE NEGATIVE Final    Comment: (NOTE) The Xpert Xpress SARS-CoV-2/FLU/RSV plus assay is intended as an aid in the diagnosis of influenza from Nasopharyngeal swab specimens and should not be used as a sole basis for treatment. Nasal washings  and aspirates are unacceptable for Xpert Xpress SARS-CoV-2/FLU/RSV testing.  Fact Sheet for Patients: EntrepreneurPulse.com.au  Fact Sheet for Healthcare Providers: IncredibleEmployment.be  This test is not yet approved or cleared by the [place] FDA and has been authorized for detection and/or diagnosis of SARS-CoV-2 by FDA under an Emergency Use Authorization (EUA). This EUA will remain in effect (meaning this test can be used) for the duration of the COVID-19 declaration under Section 564(b)(1) of the Act, 21 U.S.C. section 360bbb-3(b)(1), unless the authorization is terminated or revoked.  Performed at [facility], [place] [address], [place], [place] [address]   Blood Culture (routine x 2)     Status: None (Preliminary result)   Collection Time: 04/29/21 10:40 PM   Specimen: BLOOD RIGHT FOREARM  Result Value Ref Range Status   Specimen Description BLOOD RIGHT FOREARM  Final   Special Requests   Final    BOTTLES DRAWN AEROBIC AND ANAEROBIC Blood Culture results may not be optimal due to an inadequate volume of blood received in culture bottles   Culture   Final    NO GROWTH 4 DAYS Performed at [facility], [place] [address], [place], [place] [address]    Report Status PENDING  Incomplete  Blood Culture (routine x 2)     Status: None (Preliminary result)   Collection Time: 04/29/21 10:48 PM   Specimen: BLOOD RIGHT HAND  Result Value Ref Range Status   Specimen Description BLOOD RIGHT HAND  Final   Special Requests   Final    BOTTLES DRAWN AEROBIC AND ANAEROBIC Blood Culture results may not be optimal due to an inadequate volume of blood received in culture bottles   Culture   Final    NO GROWTH 4 DAYS Performed at [facility], [place] [address] [place],  [address]    Report Status PENDING  Incomplete          Radiology Studies: CT Angio Chest Pulmonary Embolism (PE) W or WO Contrast  Result Date:  05/03/2021 CLINICAL DATA:  PE suspected, elevated D-dimer, COVID EXAM: CT ANGIOGRAPHY CHEST WITH CONTRAST TECHNIQUE: Multidetector CT imaging of the chest was performed using the standard protocol during bolus administration of intravenous contrast. Multiplanar CT image reconstructions and MIPs were obtained to evaluate the vascular anatomy. CONTRAST:  8m OMNIPAQUE IOHEXOL 350 MG/ML SOLN COMPARISON:  02/26/2021 FINDINGS: Cardiovascular: Satisfactory opacification of the pulmonary arteries to the segmental level. No evidence of pulmonary embolism. Cardiomegaly. Extensive 3 vessel coronary artery calcifications and/or stents. Moderate pericardial effusion. Aortic atherosclerosis Mediastinum/Nodes: No enlarged mediastinal, hilar, or axillary lymph nodes. Thyroid gland, trachea, and esophagus demonstrate no significant findings. Lungs/Pleura: Diffuse bilateral ground-glass airspace attenuation, superimposed  upon moderate centrilobular emphysema. No pleural effusion or pneumothorax. Upper Abdomen: Moderate volume ascites in the included upper abdomen. Somewhat coarse contour of the included liver. Musculoskeletal: No chest wall abnormality.  Renal osteodystrophy. Review of the MIP images confirms the above findings. IMPRESSION: 1. Negative examination for pulmonary embolism. 2. Diffuse bilateral ground-glass airspace attenuation, superimposed upon moderate centrilobular emphysema. Although potentially in keeping with COVID airspace disease, this appearance is generally nonspecific and may reflect a component of pulmonary edema. 3. Cardiomegaly and coronary artery disease. 4. Moderate pericardial effusion. 5. Moderate volume ascites in the included upper abdomen. 6. Somewhat coarse contour of the included liver, suggestive of cirrhosis. Aortic Atherosclerosis (ICD10-I70.0) and Emphysema (ICD10-J43.9). Electronically Signed   By: Eddie Candle M.D.   On: 05/03/2021 10:20        Scheduled Meds:  calcium acetate   1,334 mg Oral TID WC   Chlorhexidine Gluconate Cloth  6 each Topical Q0600   cinacalcet  30 mg Oral Q breakfast   guaiFENesin  600 mg Oral BID   hydrALAZINE  50 mg Oral TID   nicotine  21 mg Transdermal Daily   sodium chloride flush  3 mL Intravenous Q12H   thiamine  100 mg Oral Daily   Continuous Infusions:  sodium chloride            Aline August, MD [facility] 05/04/2021, 7:56 AM

## 2021-05-05 NOTE — TOC Transition Note (Signed)
Transition of care contact from inpatient facility  Date of discharge: 05/04/21 Date of contact: 05/05/21 Method: Attempted Spoke to: No Answer  Attempted to call Mr. Morua to discuss recent hospitalization but patient did not pick up the phone. Unable to leave voicemail because the mailbox is currently full.  Plan for renal team to f/u with patient during his next scheduled HD at [facility].  Tobie Poet, NP

## 2021-05-10 ENCOUNTER — Emergency Department (HOSPITAL_COMMUNITY)
Admission: EM | Admit: 2021-05-10 | Discharge: 2021-05-11 | Disposition: A | Discharge status: Home / Self Care | Payer: Medicare Other | Attending: Emergency Medicine | Admitting: Emergency Medicine

## 2021-05-10 ENCOUNTER — Emergency Department (HOSPITAL_COMMUNITY): Payer: Medicare Other

## 2021-05-10 ENCOUNTER — Other Ambulatory Visit: Payer: Self-pay

## 2021-05-10 DIAGNOSIS — R093 Abnormal sputum: Secondary | ICD-10-CM | POA: Diagnosis not present

## 2021-05-10 DIAGNOSIS — N186 End stage renal disease: Secondary | ICD-10-CM | POA: Diagnosis not present

## 2021-05-10 DIAGNOSIS — Z8616 Personal history of COVID-19: Secondary | ICD-10-CM | POA: Insufficient documentation

## 2021-05-10 DIAGNOSIS — J449 Chronic obstructive pulmonary disease, unspecified: Secondary | ICD-10-CM | POA: Diagnosis not present

## 2021-05-10 DIAGNOSIS — R072 Precordial pain: Secondary | ICD-10-CM | POA: Insufficient documentation

## 2021-05-10 DIAGNOSIS — R0602 Shortness of breath: Secondary | ICD-10-CM | POA: Insufficient documentation

## 2021-05-10 DIAGNOSIS — Z992 Dependence on renal dialysis: Secondary | ICD-10-CM | POA: Diagnosis not present

## 2021-05-10 DIAGNOSIS — R42 Dizziness and giddiness: Secondary | ICD-10-CM | POA: Insufficient documentation

## 2021-05-10 DIAGNOSIS — E8779 Other fluid overload: Secondary | ICD-10-CM

## 2021-05-10 DIAGNOSIS — R000 Tachycardia, unspecified: Secondary | ICD-10-CM | POA: Diagnosis not present

## 2021-05-10 DIAGNOSIS — K5900 Constipation, unspecified: Secondary | ICD-10-CM | POA: Insufficient documentation

## 2021-05-10 DIAGNOSIS — E8770 Fluid overload, unspecified: Secondary | ICD-10-CM | POA: Insufficient documentation

## 2021-05-10 DIAGNOSIS — I5043 Acute on chronic combined systolic (congestive) and diastolic (congestive) heart failure: Secondary | ICD-10-CM | POA: Diagnosis not present

## 2021-05-10 DIAGNOSIS — R140 Abdominal distension (gaseous): Secondary | ICD-10-CM | POA: Insufficient documentation

## 2021-05-10 DIAGNOSIS — I132 Hypertensive heart and chronic kidney disease with heart failure and with stage 5 chronic kidney disease, or end stage renal disease: Secondary | ICD-10-CM | POA: Diagnosis not present

## 2021-05-10 DIAGNOSIS — F17210 Nicotine dependence, cigarettes, uncomplicated: Secondary | ICD-10-CM | POA: Diagnosis not present

## 2021-05-10 LAB — COMPREHENSIVE METABOLIC PANEL
ALT: 16 U/L (ref 0–44)
AST: 17 U/L (ref 15–41)
Albumin: 3.3 g/dL — ABNORMAL LOW (ref 3.5–5.0)
Alkaline Phosphatase: 116 U/L (ref 38–126)
Anion gap: 26 — ABNORMAL HIGH (ref 5–15)
BUN: 92 mg/dL — ABNORMAL HIGH (ref 6–20)
CO2: 13 mmol/L — ABNORMAL LOW (ref 22–32)
Calcium: 8.9 mg/dL (ref 8.9–10.3)
Chloride: 106 mmol/L (ref 98–111)
Creatinine, Ser: 14.96 mg/dL — ABNORMAL HIGH (ref 0.61–1.24)
GFR, Estimated: 4 mL/min — ABNORMAL LOW (ref >60)
Glucose, Bld: 76 mg/dL (ref 70–99)
Potassium: 4.7 mmol/L (ref 3.5–5.1)
Sodium: 145 mmol/L (ref 135–145)
Total Bilirubin: 1.1 mg/dL (ref 0.3–1.2)
Total Protein: 8.2 g/dL — ABNORMAL HIGH (ref 6.5–8.1)

## 2021-05-10 LAB — CBC WITH DIFFERENTIAL/PLATELET
Abs Immature Granulocytes: 0.08 10*3/uL — ABNORMAL HIGH (ref 0.00–0.07)
Basophils Absolute: 0 10*3/uL (ref 0.0–0.1)
Basophils Relative: 0 %
Eosinophils Absolute: 0.1 10*3/uL (ref 0.0–0.5)
Eosinophils Relative: 1 %
HCT: 24.9 % — ABNORMAL LOW (ref 39.0–52.0)
Hemoglobin: 7.2 g/dL — ABNORMAL LOW (ref 13.0–17.0)
Immature Granulocytes: 1 %
Lymphocytes Relative: 7 %
Lymphs Abs: 0.6 10*3/uL — ABNORMAL LOW (ref 0.7–4.0)
MCH: 31.7 pg (ref 26.0–34.0)
MCHC: 28.9 g/dL — ABNORMAL LOW (ref 30.0–36.0)
MCV: 109.7 fL — ABNORMAL HIGH (ref 80.0–100.0)
Monocytes Absolute: 0.5 10*3/uL (ref 0.1–1.0)
Monocytes Relative: 6 %
Neutro Abs: 6.8 10*3/uL (ref 1.7–7.7)
Neutrophils Relative %: 85 %
Platelets: 105 10*3/uL — ABNORMAL LOW (ref 150–400)
RBC: 2.27 MIL/uL — ABNORMAL LOW (ref 4.22–5.81)
RDW: 16.4 % — ABNORMAL HIGH (ref 11.5–15.5)
Smear Review: NORMAL
WBC: 8.1 10*3/uL (ref 4.0–10.5)
nRBC: 0 % (ref 0.0–0.2)

## 2021-05-10 LAB — TROPONIN I (HIGH SENSITIVITY)
Troponin I (High Sensitivity): 62 ng/L — ABNORMAL HIGH (ref <18)
Troponin I (High Sensitivity): 74 ng/L — ABNORMAL HIGH (ref <18)

## 2021-05-10 LAB — BRAIN NATRIURETIC PEPTIDE: B Natriuretic Peptide: 3463.3 pg/mL — ABNORMAL HIGH (ref 0.0–100.0)

## 2021-05-10 IMAGING — CT CT ABD-PELV W/O CM
2 of 4 series · 16 of 46 positions shown, 18 images · non-contrast
Comparison: [DATE]

CLINICAL DATA: Abdominal distension. End-stage renal failure on
dialysis. Congestive heart failure.

EXAM:
CT ABDOMEN AND PELVIS WITHOUT CONTRAST
TECHNIQUE: Multidetector CT imaging of the abdomen and pelvis was performed
following the standard protocol without IV contrast.

[Series 3: a/p w/o 5mm · axial · non-contrast · 0.70mm/px · z∈[-853,-418]mm · 13 of 95 slices shown, 15 images]
[im 4/95  soft-tissue]
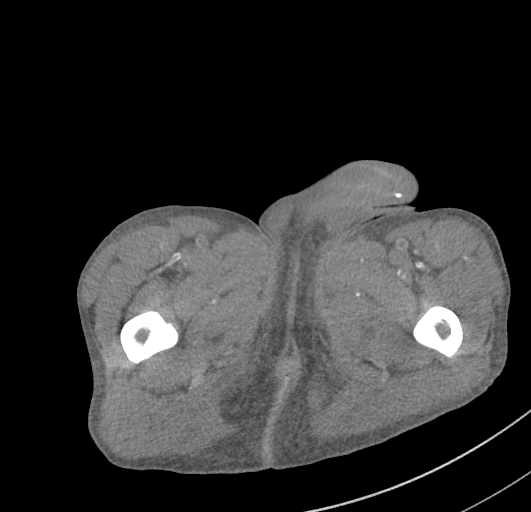
[im 4/95  bone]
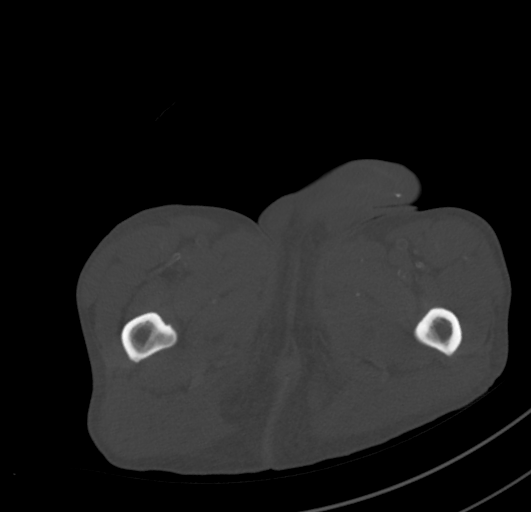
[im 12/95  soft-tissue]
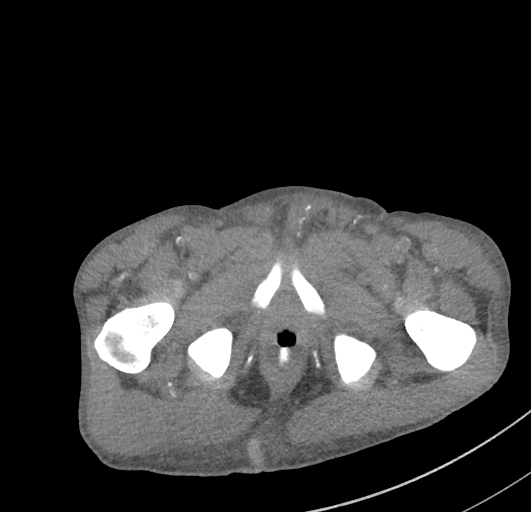
[im 19/95  soft-tissue]
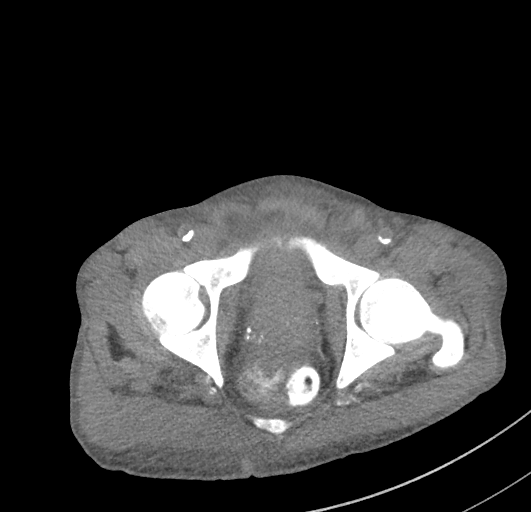
[im 27/95  soft-tissue]
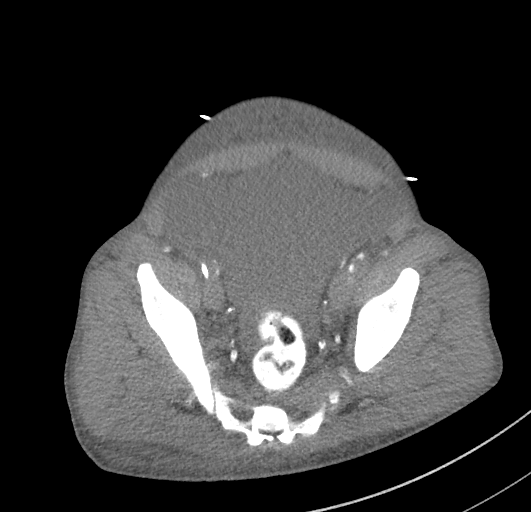
[im 34/95  soft-tissue]
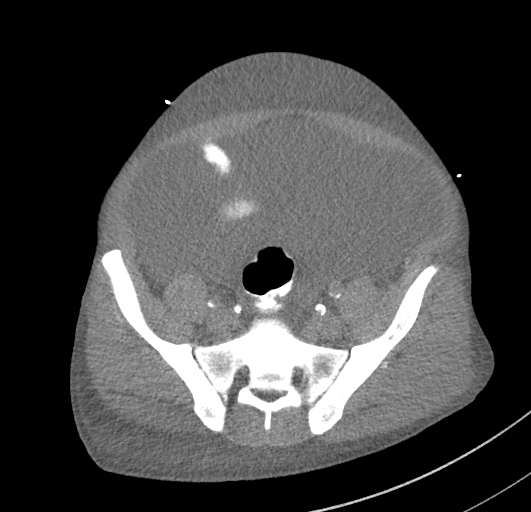
[im 42/95  soft-tissue]
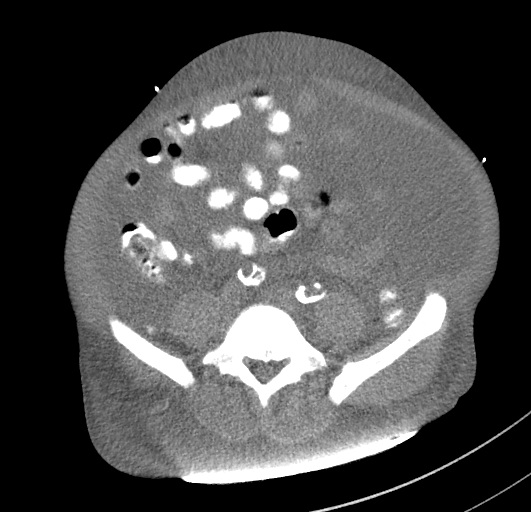
[im 49/95  soft-tissue]
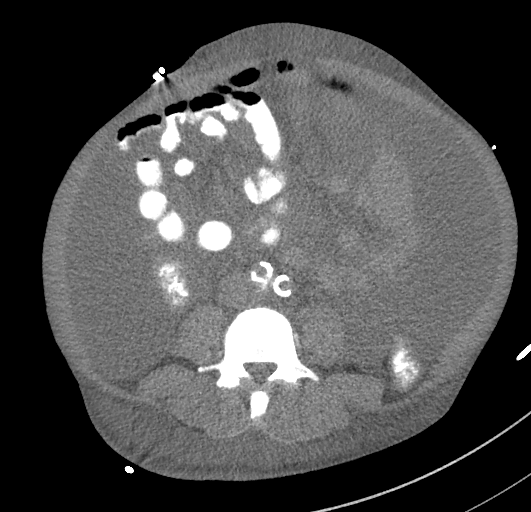
[im 53/95  soft-tissue]
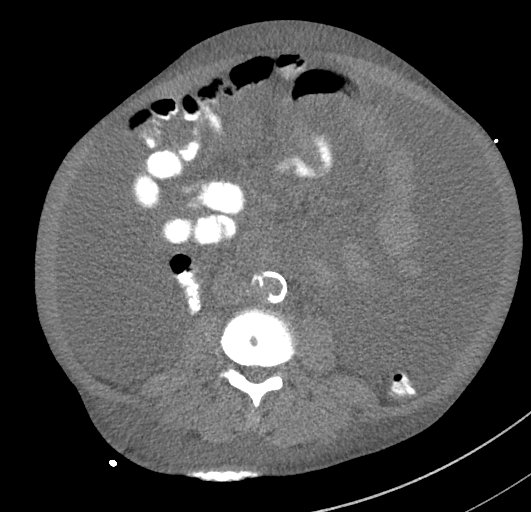
[im 61/95  soft-tissue]
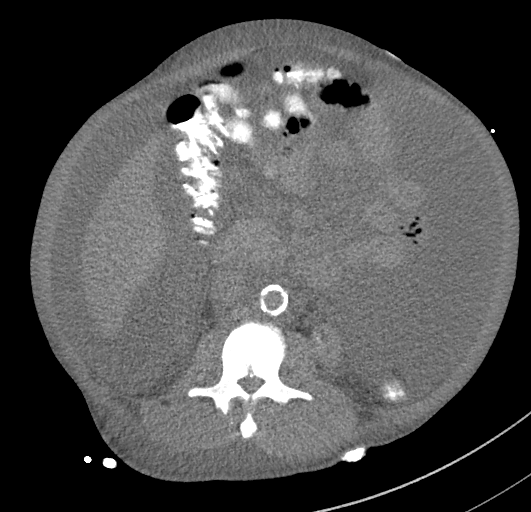
[im 61/95  bone]
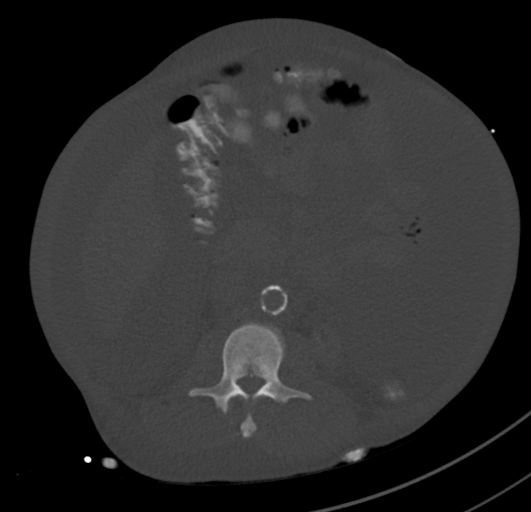
[im 68/95  soft-tissue]
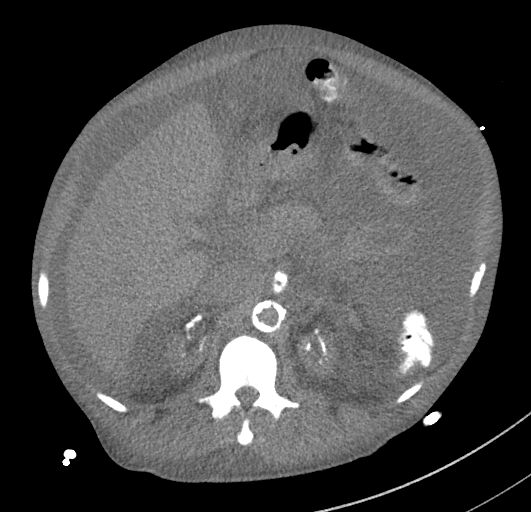
[im 76/95  soft-tissue]
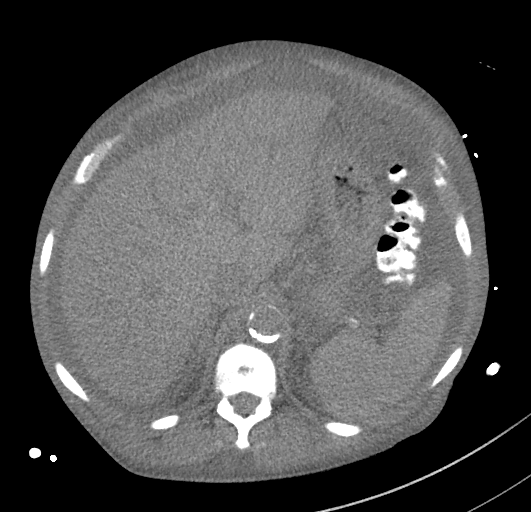
[im 83/95  soft-tissue]
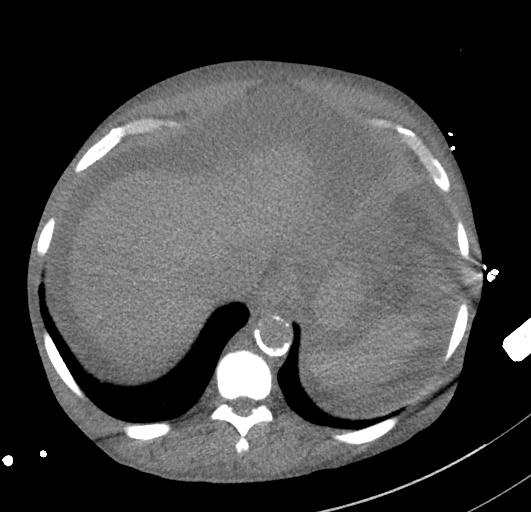
[im 91/95  soft-tissue]
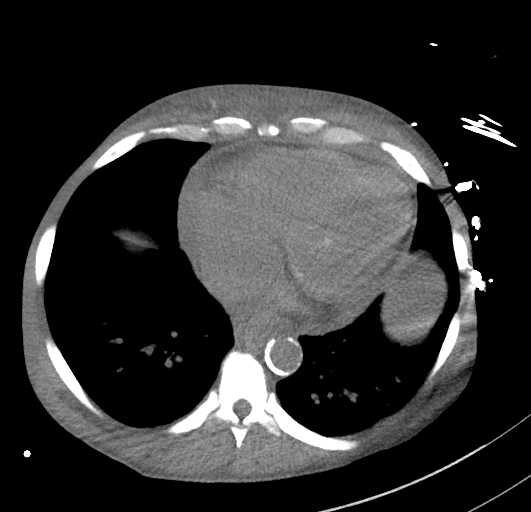

[Series 6: a/p w/o cor · coronal · non-contrast · 0.74mm/px · 3 of 177 slices shown]
[im 59/177  soft-tissue]
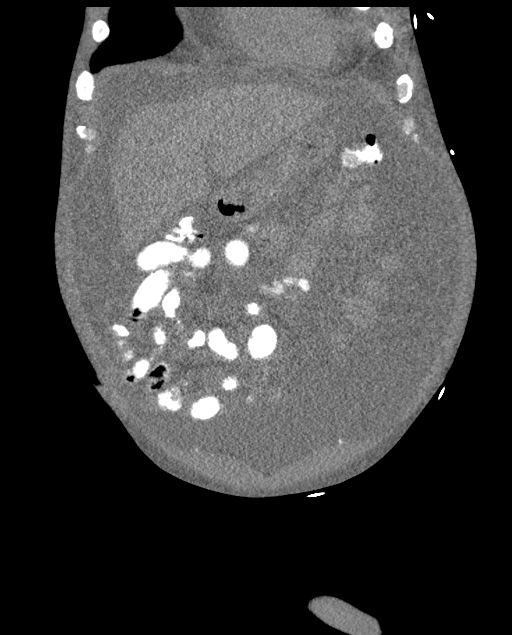
[im 79/177  soft-tissue]
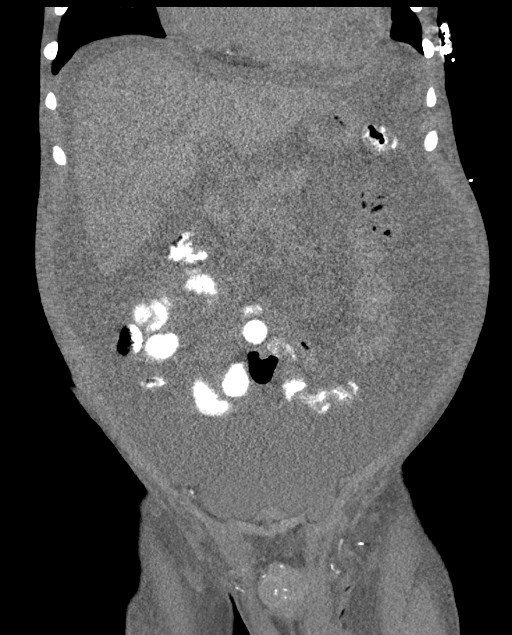
[im 98/177  soft-tissue]
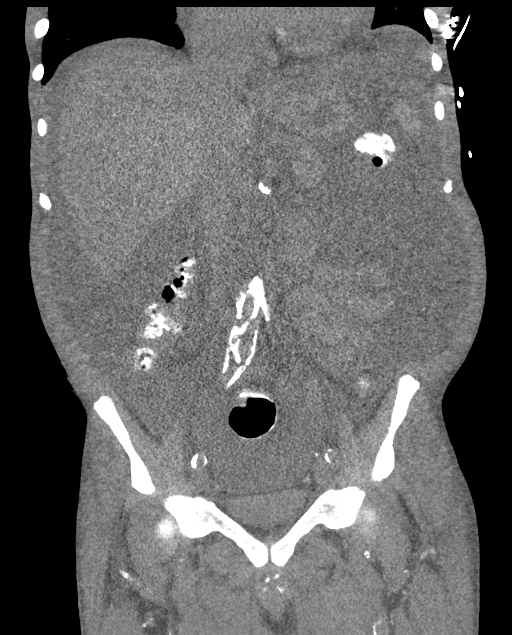

[16 of 46 positions shown; findings below may reference images not displayed]

FINDINGS: Lower chest: Stable small pericardial effusion. Mild uniform
ground-glass opacity in both lung bases is suspicious for mild
pulmonary edema.

Hepatobiliary: No mass visualized on this unenhanced exam.
Gallbladder is unremarkable. No evidence of biliary ductal
dilatation.

Pancreas: No mass or inflammatory process visualized on this
unenhanced exam.

Spleen:  Within normal limits in size.

Adrenals/Urinary tract: Severe bilateral renal atrophy again noted.
Extensive renal vascular calcification again noted. No evidence of
urolithiasis or hydronephrosis.

Stomach/Bowel: No evidence of obstruction, inflammatory process, or
abnormal fluid collections. Normal appendix visualized.

Vascular/Lymphatic: No pathologically enlarged lymph nodes
identified. No evidence of abdominal aortic aneurysm. Aortic
atherosclerotic calcification noted.

Reproductive:  No mass or other significant abnormality.

Other: Large amount of ascites shows no significant change. Severe
diffuse body wall edema has mildly increased since prior exam.

Musculoskeletal: No suspicious bone lesions identified. Diffuse
osteosclerosis again seen, consistent with renal osteodystrophy.
IMPRESSION: No acute findings.

Anasarca, with large amount of ascites, similar to prior exam.

Stable small pericardial effusion.

Stable severe bilateral renal atrophy and findings of renal
osteodystrophy. No evidence of hydronephrosis.

Aortic Atherosclerosis ([UI]-[UI]).

## 2021-05-10 IMAGING — CR DG CHEST 2V
2 series · 2 of 2 positions shown · non-contrast
Comparison: Radiograph [DATE], chest CT [DATE]

CLINICAL DATA: Dyspnea

EXAM:
CHEST - 2 VIEW

[chest lat]
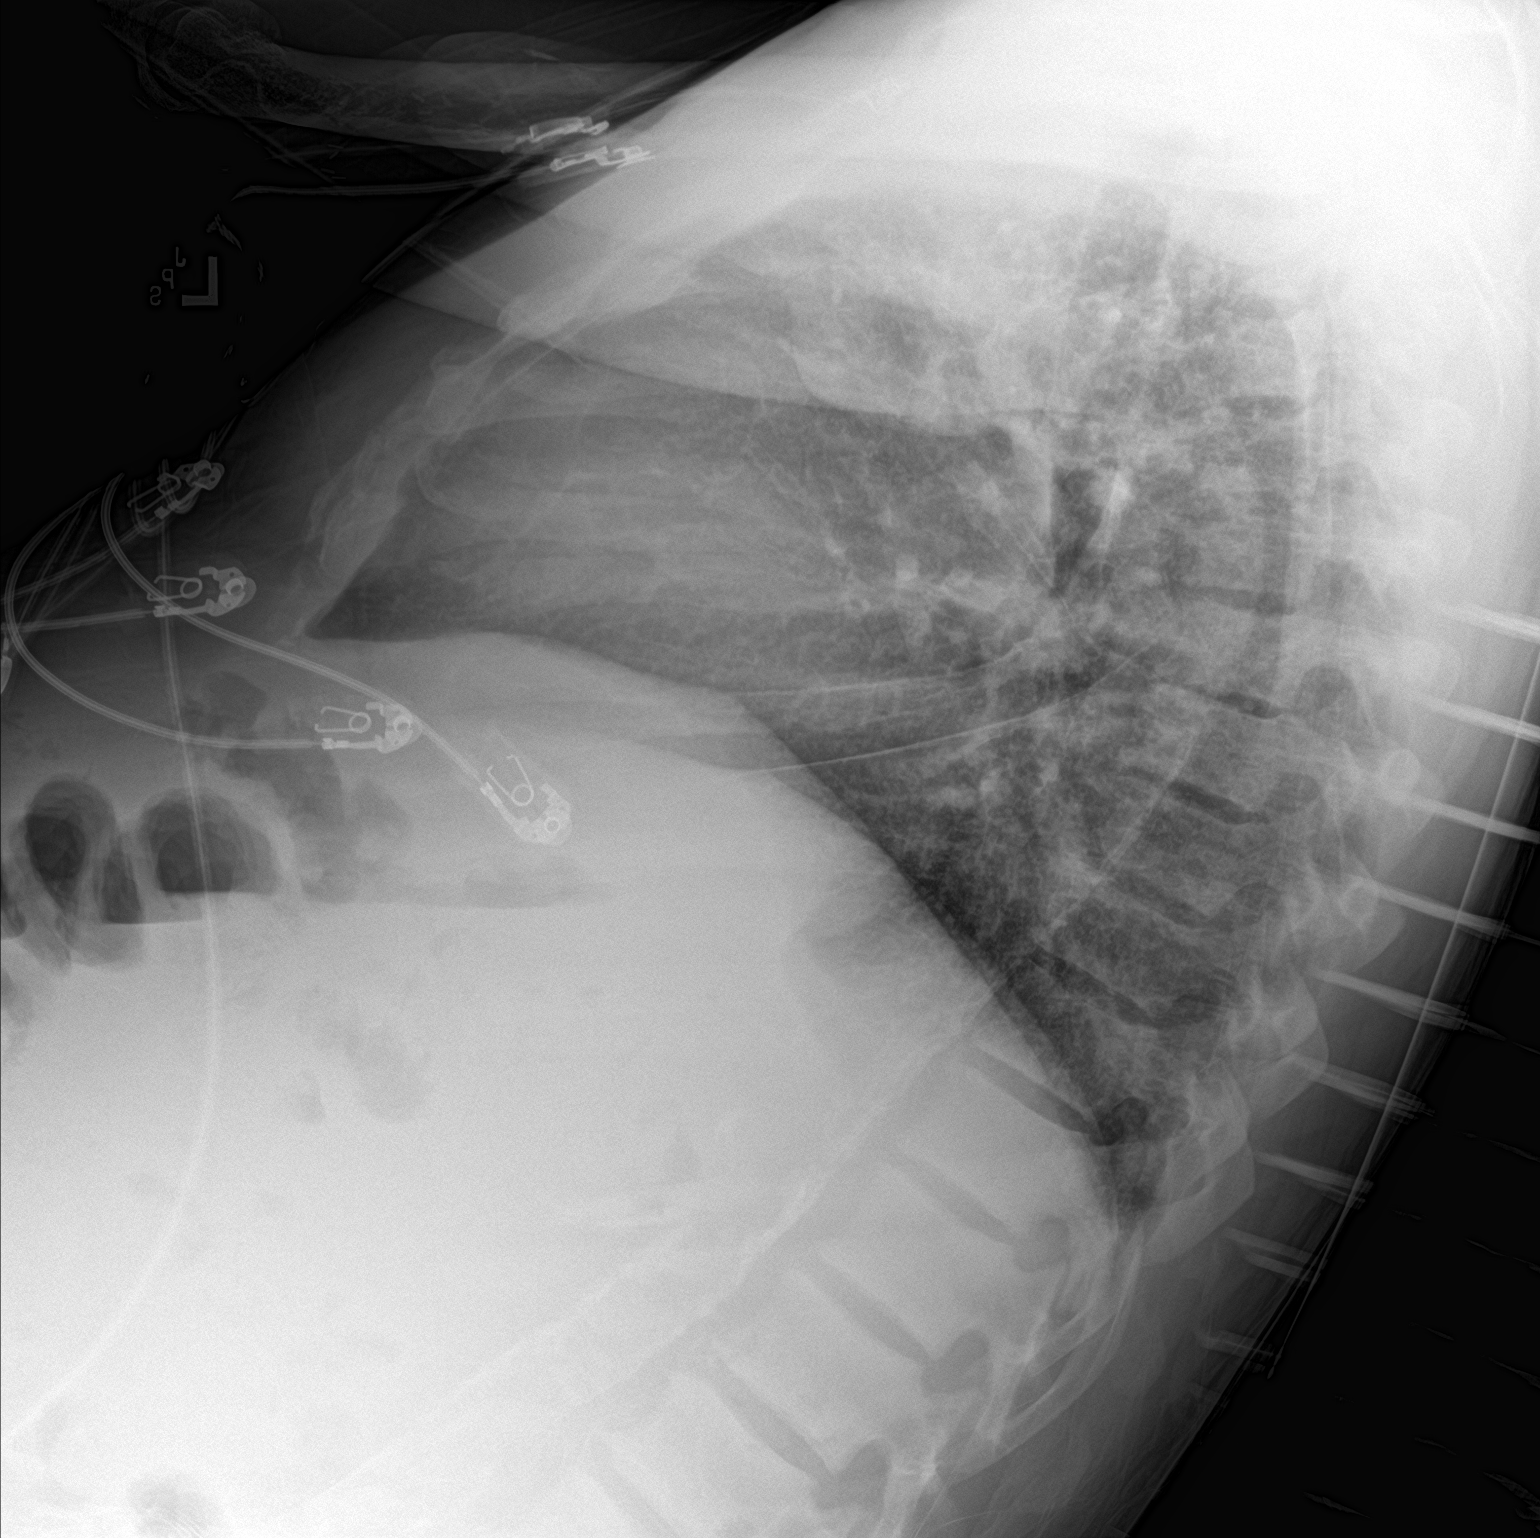

[chest ap]
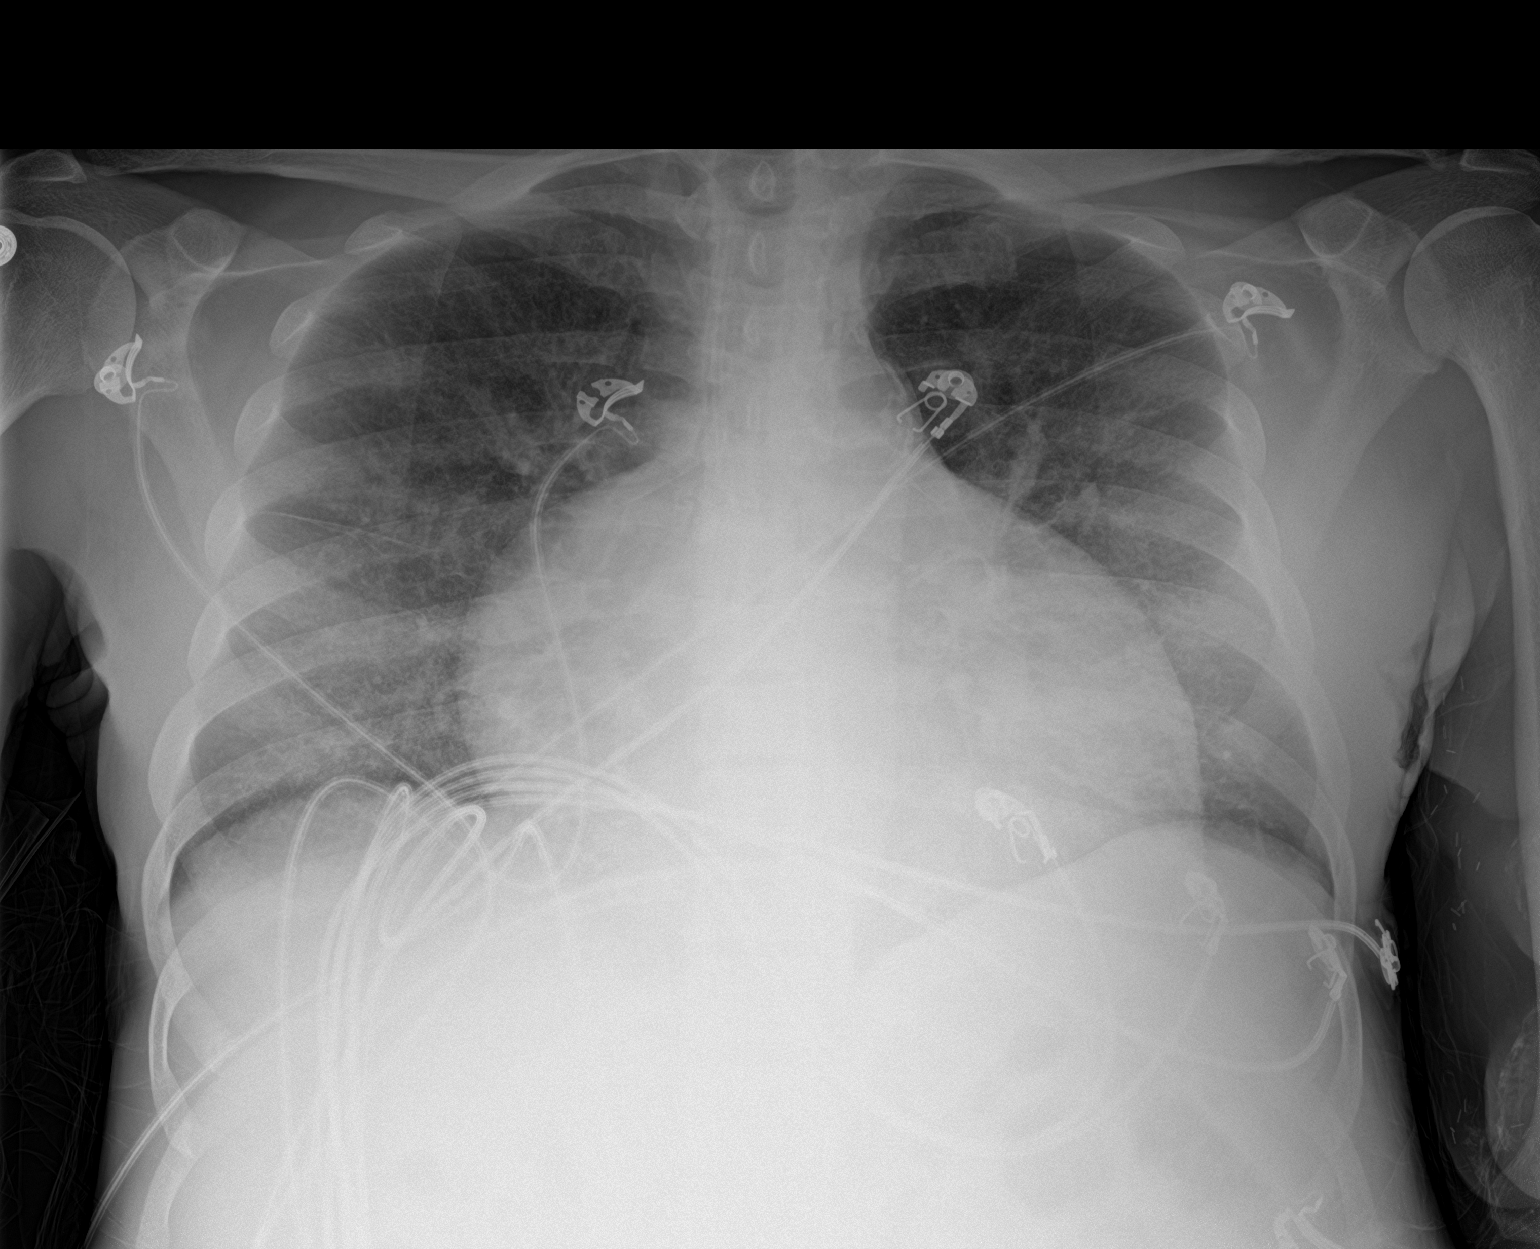

[2 of 2 positions shown; findings below may reference images not displayed]

FINDINGS: Unchanged, enlarged cardiac silhouette with a globular appearance.
Diffuse interstitial and bilateral airspace opacities. No large
pleural effusion or visible pneumothorax. No acute osseous
abnormality. Postsurgical changes involving the left upper
extremity.
IMPRESSION: Unchanged large heart with appearance compatible with pericardial
effusion.

Diffuse interstitial opacities with bilateral airspace disease
compatible with mild pulmonary edema.

## 2021-05-10 MED ORDER — DARBEPOETIN ALFA 200 MCG/0.4ML IJ SOSY: 200.0000 ug | PREFILLED_SYRINGE | INTRAMUSCULAR | Status: DC

## 2021-05-10 MED ORDER — CALCITRIOL 0.5 MCG PO CAPS: 1.5000 ug | ORAL_CAPSULE | ORAL | Status: DC

## 2021-05-10 MED ORDER — CHLORHEXIDINE GLUCONATE CLOTH 2 % EX PADS: 6.0000 | MEDICATED_PAD | Freq: Every day | CUTANEOUS | Status: DC

## 2021-05-10 NOTE — ED Notes (Signed)
Pt given second bottle of contrast to drink

## 2021-05-10 NOTE — Progress Notes (Signed)
[facility] Progress Note    Background: Jordan [name] is a 55 Y/O male with ESRD on hemodialysis MWF at [facility]. PMH: HTN, HFpEF EF-50%, G2DD, PAF, medical noncompliance, Etoh and tobacco abuse, COPD, sHPT, Anemia of ESRD. Recent admission for volume overload D/T missed HD,incidental finding of COVID 19 08/08-08/13/2022. Patient never returned to HD center. Last HD 05/03/2021. Patient says he never called transportation to resume transport to HD and was "too weak to walk to bus stop".   Patient presented to ED via EMS with C/O SOB on exertion. BP 160/90, HR 94 RR 18 O2 Sats 99% on 4L/M [place]. CXR with Unchanged large heart with appearance compatible with pericardial effusion.Diffuse interstitial opacities with bilateral airspace disease compatible with mild pulmonary edema. Labs SCr 14.96 BUN 92 K+ 4.7 Ca 8.9 WBC 8.1 HGB 7.2 PLT 105.  Seen in ED. He denies SOB at present, C/O "clear gel" from rectum. Abdomen is protuberant, possible ascites present.    Unclear of plan at this time but will order HD for today.   Subjective:     Objective Vitals:   05/10/21 1152 05/10/21 1155 05/10/21 1200  BP: (!) 147/103  (!) 145/96  Pulse: (!) 107  (!) 104  Resp: (!) 22  (!) 27  Temp: 97.8 F (36.6 C)    TempSrc: Oral    SpO2: 100% 99% 100%   Physical Exam General: Chronically ill appearing male in NAD Heart:ST on monitor. Rate low 100s. S1,S2 no M/R/G Lungs: Bilateral breath sounds with bibasilar crackles, coarse breath sounds scattered throughout lung fields. No WOB. O2 @ 4L/M.  Abdomen: protuberant, firm, ? Ascites???  Extremities: No LE edema  Dialysis Access: L AVF + T/B    Additional Objective Labs: Basic Metabolic Panel: No results for input(s): NA, K, CL, CO2, GLUCOSE, BUN, CREATININE, CALCIUM, PHOS in the last 168 hours.  Invalid input(s): ALB Liver Function Tests: No results for input(s): AST, ALT, ALKPHOS, BILITOT, PROT, ALBUMIN in the last 168  hours. No results for input(s): LIPASE, AMYLASE in the last 168 hours. CBC: No results for input(s): WBC, NEUTROABS, HGB, HCT, MCV, PLT in the last 168 hours. Blood Culture    Component Value Date/Time   SDES BLOOD RIGHT HAND 04/29/2021 2248   SPECREQUEST  04/29/2021 2248    BOTTLES DRAWN AEROBIC AND ANAEROBIC Blood Culture results may not be optimal due to an inadequate volume of blood received in culture bottles   CULT  04/29/2021 2248    NO GROWTH 5 DAYS Performed at [facility], [place] [address] [place], [place] [address]    REPTSTATUS 05/04/2021 FINAL 04/29/2021 2248    Cardiac Enzymes: No results for input(s): CKTOTAL, CKMB, CKMBINDEX, TROPONINI in the last 168 hours. CBG: No results for input(s): GLUCAP in the last 168 hours. Iron Studies: No results for input(s): IRON, TIBC, TRANSFERRIN, FERRITIN in the last 72 hours. '@lablastinr3'$ @ Studies/Results: DG Chest 2 View  Result Date: 05/10/2021 CLINICAL DATA:  Dyspnea EXAM: CHEST - 2 VIEW COMPARISON:  Radiograph 05/04/2021, chest CT 05/03/2021 FINDINGS: Unchanged, enlarged cardiac silhouette with a globular appearance. Diffuse interstitial and bilateral airspace opacities. No large pleural effusion or visible pneumothorax. No acute osseous abnormality. Postsurgical changes involving the left upper extremity. IMPRESSION: Unchanged large heart with appearance compatible with pericardial effusion. Diffuse interstitial opacities with bilateral airspace disease compatible with mild pulmonary edema. Electronically Signed   By: Maurine Simmering M.D.   On: 05/10/2021 13:02   Medications:  HD orders: [facility] MWF  4 hrs 180NRe 450/800 67kg 2.0 K/2.0 Ca AVF -No heparin  -Mircera 225 mcg IV q 2 weeks (Last dose 04/26/2021) -Venofer 50 mg IV weekly (last dose 04/26/2021) -Calcitriol 1.5 mcg PO TIW   Assessment/Plan: 1. Volume overload D/T noncompliance with HD: Mild interstitial edema /unchanged large heart compatible with pericardial  effusion. No pericardial rub heard. Probable ascites present. Will order HD for today. UF as tolerated.   2. ESRD -MWF. Hasn't been to HD since discharged from hospital 05/04/21. Chronic noncompliance with HD.  3. Anemia -HGB 7.2. Will order ESA for today. Follow trends.  4. Secondary hyperparathyroidism - Last IP PO4 7.8 C Ca 9.008/10/22. Resume binders, VDRA, sensipar.  5. HTN/volume - Volume as noted above. Hypertensive, resume hydralazine.  6. Nutrition - NPO at present.  7. COPD-per primary 8. HFpEF-volume difficult to manage D/T chronic noncompliance with HD.  9. Recent COVID 19 infection  Markeeta Scalf H. Milley Vining NP-C 05/10/2021, 1:42 PM  [employer] [phone]

## 2021-05-10 NOTE — ED Provider Notes (Signed)
Haven't been dialyzed since 8/13th.  Here with SOB, cough, cp, abd pain and loose stool.  Is clinically fluid overload.  Awaits abd/pelvis CT and delta trop.  If CT neg, then can go for dialysis, then back to ER for discharge.   Received signout from previous provider, please see her note for complete H&P.  In short this is a 55 year old male significant history of end-stage renal disease who missed 5 days of dialysis and presenting with complaints of shortness of breath.  On exam he does have some abdominal discomfort therefore an abdominal pelvis CT scan was performed which showed no acute finding.  Patient was accepted by dialysis specialist and did get dialyzed.  He is otherwise stable for discharge.  BP 102/74   Pulse 93   Temp 98.8 F (37.1 C) (Oral)   Resp 18   Wt 73.7 kg   SpO2 99%   BMI 27.89 kg/m   Results for orders placed or performed during the hospital encounter of 05/10/21  Comprehensive metabolic panel  Result Value Ref Range   Sodium 145 135 - 145 mmol/L   Potassium 4.7 3.5 - 5.1 mmol/L   Chloride 106 98 - 111 mmol/L   CO2 13 (L) 22 - 32 mmol/L   Glucose, Bld 76 70 - 99 mg/dL   BUN 92 (H) 6 - 20 mg/dL   Creatinine, Ser 14.96 (H) 0.61 - 1.24 mg/dL   Calcium 8.9 8.9 - 10.3 mg/dL   Total Protein 8.2 (H) 6.5 - 8.1 g/dL   Albumin 3.3 (L) 3.5 - 5.0 g/dL   AST 17 15 - 41 U/L   ALT 16 0 - 44 U/L   Alkaline Phosphatase 116 38 - 126 U/L   Total Bilirubin 1.1 0.3 - 1.2 mg/dL   GFR, Estimated 4 (L) >60 mL/min   Anion gap 26 (H) 5 - 15  CBC WITH DIFFERENTIAL  Result Value Ref Range   WBC 8.1 4.0 - 10.5 K/uL   RBC 2.27 (L) 4.22 - 5.81 MIL/uL   Hemoglobin 7.2 (L) 13.0 - 17.0 g/dL   HCT 24.9 (L) 39.0 - 52.0 %   MCV 109.7 (H) 80.0 - 100.0 fL   MCH 31.7 26.0 - 34.0 pg   MCHC 28.9 (L) 30.0 - 36.0 g/dL   RDW 16.4 (H) 11.5 - 15.5 %   Platelets 105 (L) 150 - 400 K/uL   nRBC 0.0 0.0 - 0.2 %   Neutrophils Relative % 85 %   Neutro Abs 6.8 1.7 - 7.7 K/uL   Lymphocytes Relative 7  %   Lymphs Abs 0.6 (L) 0.7 - 4.0 K/uL   Monocytes Relative 6 %   Monocytes Absolute 0.5 0.1 - 1.0 K/uL   Eosinophils Relative 1 %   Eosinophils Absolute 0.1 0.0 - 0.5 K/uL   Basophils Relative 0 %   Basophils Absolute 0.0 0.0 - 0.1 K/uL   WBC Morphology MORPHOLOGY UNREMARKABLE    RBC Morphology MORPHOLOGY UNREMARKABLE    Smear Review Normal platelet morphology    Immature Granulocytes 1 %   Abs Immature Granulocytes 0.08 (H) 0.00 - 0.07 K/uL  Brain natriuretic peptide  Result Value Ref Range   B Natriuretic Peptide 3,463.3 (H) 0.0 - 100.0 pg/mL  Troponin I (High Sensitivity)  Result Value Ref Range   Troponin I (High Sensitivity) 62 (H) <18 ng/L  Troponin I (High Sensitivity)  Result Value Ref Range   Troponin I (High Sensitivity) 74 (H) <18 ng/L   CT Abdomen Pelvis Wo Contrast  Result Date: 05/10/2021 CLINICAL DATA:  Abdominal distension. End-stage renal failure on dialysis. Congestive heart failure. EXAM: CT ABDOMEN AND PELVIS WITHOUT CONTRAST TECHNIQUE: Multidetector CT imaging of the abdomen and pelvis was performed following the standard protocol without IV contrast. COMPARISON:  04/26/2021 FINDINGS: Lower chest: Stable small pericardial effusion. Mild uniform ground-glass opacity in both lung bases is suspicious for mild pulmonary edema. Hepatobiliary: No mass visualized on this unenhanced exam. Gallbladder is unremarkable. No evidence of biliary ductal dilatation. Pancreas: No mass or inflammatory process visualized on this unenhanced exam. Spleen:  Within normal limits in size. Adrenals/Urinary tract: Severe bilateral renal atrophy again noted. Extensive renal vascular calcification again noted. No evidence of urolithiasis or hydronephrosis. Stomach/Bowel: No evidence of obstruction, inflammatory process, or abnormal fluid collections. Normal appendix visualized. Vascular/Lymphatic: No pathologically enlarged lymph nodes identified. No evidence of abdominal aortic aneurysm. Aortic  atherosclerotic calcification noted. Reproductive:  No mass or other significant abnormality. Other: Large amount of ascites shows no significant change. Severe diffuse body wall edema has mildly increased since prior exam. Musculoskeletal: No suspicious bone lesions identified. Diffuse osteosclerosis again seen, consistent with renal osteodystrophy. IMPRESSION: No acute findings. Anasarca, with large amount of ascites, similar to prior exam. Stable small pericardial effusion. Stable severe bilateral renal atrophy and findings of renal osteodystrophy. No evidence of hydronephrosis. Aortic Atherosclerosis (ICD10-I70.0). Electronically Signed   By: Marlaine Hind M.D.   On: 05/10/2021 18:50   DG Chest 2 View  Result Date: 05/10/2021 CLINICAL DATA:  Dyspnea EXAM: CHEST - 2 VIEW COMPARISON:  Radiograph 05/04/2021, chest CT 05/03/2021 FINDINGS: Unchanged, enlarged cardiac silhouette with a globular appearance. Diffuse interstitial and bilateral airspace opacities. No large pleural effusion or visible pneumothorax. No acute osseous abnormality. Postsurgical changes involving the left upper extremity. IMPRESSION: Unchanged large heart with appearance compatible with pericardial effusion. Diffuse interstitial opacities with bilateral airspace disease compatible with mild pulmonary edema. Electronically Signed   By: Maurine Simmering M.D.   On: 05/10/2021 13:02   DG Chest 2 View  Result Date: 04/29/2021 CLINICAL DATA:  Shortness of breath EXAM: CHEST - 2 VIEW COMPARISON:  Chest x-ray dated April 26, 2021 FINDINGS: Cardiomegaly, apparent decreased size of the cardiac contours is likely related to differences in technique. Unchanged bilateral interstitial opacities. More focal mild bibasilar opacities are seen likely due to atelectasis. No large pleural effusion or evidence of pneumothorax. IMPRESSION: Unchanged pulmonary edema. Cardiomegaly. Electronically Signed   By: Yetta Glassman MD   On: 04/29/2021 10:53   CT Angio  Chest Pulmonary Embolism (PE) W or WO Contrast  Result Date: 05/03/2021 CLINICAL DATA:  PE suspected, elevated D-dimer, COVID EXAM: CT ANGIOGRAPHY CHEST WITH CONTRAST TECHNIQUE: Multidetector CT imaging of the chest was performed using the standard protocol during bolus administration of intravenous contrast. Multiplanar CT image reconstructions and MIPs were obtained to evaluate the vascular anatomy. CONTRAST:  33m OMNIPAQUE IOHEXOL 350 MG/ML SOLN COMPARISON:  02/26/2021 FINDINGS: Cardiovascular: Satisfactory opacification of the pulmonary arteries to the segmental level. No evidence of pulmonary embolism. Cardiomegaly. Extensive 3 vessel coronary artery calcifications and/or stents. Moderate pericardial effusion. Aortic atherosclerosis Mediastinum/Nodes: No enlarged mediastinal, hilar, or axillary lymph nodes. Thyroid gland, trachea, and esophagus demonstrate no significant findings. Lungs/Pleura: Diffuse bilateral ground-glass airspace attenuation, superimposed upon moderate centrilobular emphysema. No pleural effusion or pneumothorax. Upper Abdomen: Moderate volume ascites in the included upper abdomen. Somewhat coarse contour of the included liver. Musculoskeletal: No chest wall abnormality.  Renal osteodystrophy. Review of the MIP images confirms the above findings. IMPRESSION: 1.  Negative examination for pulmonary embolism. 2. Diffuse bilateral ground-glass airspace attenuation, superimposed upon moderate centrilobular emphysema. Although potentially in keeping with COVID airspace disease, this appearance is generally nonspecific and may reflect a component of pulmonary edema. 3. Cardiomegaly and coronary artery disease. 4. Moderate pericardial effusion. 5. Moderate volume ascites in the included upper abdomen. 6. Somewhat coarse contour of the included liver, suggestive of cirrhosis. Aortic Atherosclerosis (ICD10-I70.0) and Emphysema (ICD10-J43.9). Electronically Signed   By: Eddie Candle M.D.   On:  05/03/2021 10:20   CT ABDOMEN PELVIS W CONTRAST  Result Date: 04/26/2021 CLINICAL DATA:  Abdominal distension and pain. EXAM: CT ABDOMEN AND PELVIS WITH CONTRAST TECHNIQUE: Multidetector CT imaging of the abdomen and pelvis was performed using the standard protocol following bolus administration of intravenous contrast. CONTRAST:  1m OMNIPAQUE IOHEXOL 300 MG/ML  SOLN COMPARISON:  01/13/2021 FINDINGS: Lower chest: Small pericardial effusion, new. Mild opacification at the bases, suspect early edema. Extensive arterial calcification. Cardiomegaly. Hepatobiliary: Distended hepatic veins. No focal abnormality. No evidence of biliary obstruction or stone. Pancreas: Unremarkable. Spleen: Unremarkable. Adrenals/Urinary Tract: Negative adrenals. End-stage renal disease with symmetric advanced renal atrophy. Collapsed urinary bladder. Stomach/Bowel:  No obstruction. 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 Confluent atheromatous plaque on the aorta and multiple branch vessels. No mass or adenopathy. Reproductive:No acute finding Other: Large volume ascites, increased.  Increased anasarca. Musculoskeletal: Generalized sclerosis compatible with renal osteodystrophy. IMPRESSION: Worsening volume overload with large volume ascites, prominent anasarca, and new small pericardial effusion. Electronically Signed   By: JMonte FantasiaM.D.   On: 04/26/2021 07:34   DG Chest Port 1 View  Result Date: 05/04/2021 CLINICAL DATA:  55year old male with cough and shortness of breath EXAM: PORTABLE CHEST 1 VIEW COMPARISON:  Chest x-ray 04/29/2021, CT chest 05/03/2021 FINDINGS: Cardiac diameter unchanged from the prior plain film, with configuration compatible with pericardial effusion. Thickening of the minor fissure.  No pleural effusion. Coarsened interstitial markings, similar to the prior chest x-ray. Minimal interlobular septal thickening. No pneumothorax. No  confluent airspace disease. No displaced fracture IMPRESSION: Cardiomegaly compatible with generous pericardial effusion, present on prior CT chest. Similar appearance of the lungs, with reticulonodular opacities, with differential including both atypical infection as well as mild pulmonary edema. No evidence of pleural effusion. Electronically Signed   By: JCorrie MckusickD.O.   On: 05/04/2021 15:21   DG Chest Portable 1 View  Result Date: 04/26/2021 CLINICAL DATA:  55year old male with history of shortness of breath. EXAM: PORTABLE CHEST 1 VIEW COMPARISON:  Chest x-ray 04/08/2021. FINDINGS: Lung volumes are normal. No confluent consolidative airspace disease. No pleural effusions. There is cephalization of the pulmonary vasculature and slight indistinctness of the interstitial markings suggestive of mild pulmonary edema. Mild to moderate cardiomegaly. Upper mediastinal contours are within normal limits. Atherosclerotic calcifications in the thoracic aorta. IMPRESSION: 1. The appearance the chest suggests mild congestive heart failure, as above. Electronically Signed   By: DVinnie LangtonM.D.   On: 04/26/2021 05:00   VAS [place]OWER EXTREMITY VENOUS (DVT)  Result Date: 05/01/2021  Lower Venous DVT Study Patient Name:  CSHERYL JUNG Date of Exam:   05/01/2021 Medical Rec #: [id number]        Accession #:    [id number] of Birth: 102/20/1967        Patient Gender: M Patient Age:   89years [facility]:      VAS [place]  LOWER EXTREMITY VENOUS (DVT) Referring Phys: Annamaria Boots XU --------------------------------------------------------------------------------  Indications: Edema of left leg, Covid-19.  Limitations: Posterior acoustic shadowing due to overlying arterial calcification, tissue properties due to subcutaneous edema, and patient positioning. Comparison Study: No prior studies. Performing Technologist: Darlin Coco RDMS,RVT  Examination Guidelines: A complete evaluation  includes B-mode imaging, spectral Doppler, color Doppler, and power Doppler as needed of all accessible portions of each vessel. Bilateral testing is considered an integral part of a complete examination. Limited examinations for reoccurring indications may be performed as noted. The reflux portion of the exam is performed with the patient in reverse Trendelenburg.  +-----+---------------+---------+-----------+----------+--------------+ RIGHTCompressibilityPhasicitySpontaneityPropertiesThrombus Aging +-----+---------------+---------+-----------+----------+--------------+ CFV  Full           No       Yes                                 +-----+---------------+---------+-----------+----------+--------------+   +---------+---------------+---------+-----------+----------+--------------+ LEFT     CompressibilityPhasicitySpontaneityPropertiesThrombus Aging +---------+---------------+---------+-----------+----------+--------------+ CFV      Full           No       Yes                                 +---------+---------------+---------+-----------+----------+--------------+ SFJ      Full                                                        +---------+---------------+---------+-----------+----------+--------------+ FV Prox  Full                                                        +---------+---------------+---------+-----------+----------+--------------+ FV Mid   Full                                                        +---------+---------------+---------+-----------+----------+--------------+ FV DistalFull                                                        +---------+---------------+---------+-----------+----------+--------------+ PFV      Full                                                        +---------+---------------+---------+-----------+----------+--------------+ POP      Full           No       Yes                                  +---------+---------------+---------+-----------+----------+--------------+ PTV  Full                                                        +---------+---------------+---------+-----------+----------+--------------+ PERO     Full                                                        +---------+---------------+---------+-----------+----------+--------------+ Gastroc  Full                                                        +---------+---------------+---------+-----------+----------+--------------+    Summary: RIGHT: - No evidence of common femoral vein obstruction. -  LEFT: - There is no evidence of deep vein thrombosis in the lower extremity.  - No cystic structure found in the popliteal fossa.  - Pulsatile venous waveforms observed throughout extremity, suggestive of elevated right heart pressures.  *See table(s) above for measurements and observations. Electronically signed by Harold Barban MD on 05/01/2021 at 6:54:30 PM.    Final       Domenic Moras, PA-C [id number]    Campbell Stall P, DO [id number]

## 2021-05-10 NOTE — ED Provider Notes (Signed)
[facility] EMERGENCY DEPARTMENT Provider Note   CSN: [id number] Arrival date & time: 05/10/21  1144     History Chief Complaint  Patient presents with   Shortness of Breath    Jordan [name] is a 55 y.o. male.  PMH significant for ESRD (MWF dialysis schedule), HTN, A fib CHF, COPD, previous hx of alcohol abuse, Tobacco Use, recent COVID-19 infection requiring hospitalization on 04/29/21. He presents to the ED via EMS for 3 days of worsening shortness of breath. He has associated substernal chest pain that is worse with breathing and coughing. He has a productive cough with white phlegm. Denies hemoptysis. He admits to having dizziness with standing. Denies nausea, vomiting, abdominal pain, and swelling of extremities.  Denies fever, chills. He is also complaining of pruritic skin lesions in various parts of his body that have been going on for several weeks and he has mucous coming out of his rectum. Of note, he was recently discharged on the 8/13 and has missed all three dialysis sessions following discharge.     Shortness of Breath Associated symptoms: chest pain, cough and rash   Associated symptoms: no abdominal pain, no diaphoresis, no fever, no headaches and no vomiting       Past Medical History:  Diagnosis Date   Anemia    Aortic atherosclerosis ([place]) 11/12/2019   CKD (chronic kidney disease)    Stage 5  Dialysis - M/W/F in [place], [place]   Dyspnea    tx with inhaler when sick   ED (erectile dysfunction)    Emphysema of lung ([place]) 11/12/2019   ETOH abuse    History of blood transfusion    Hypertension    Wears dentures     Patient Active Problem List   Diagnosis Date Noted   Pulmonary edema 04/29/2021   Pruritus 04/29/2021   COVID-19 virus infection 04/29/2021   Thrombocytopenia ([place]) 04/08/2021   COPD (chronic obstructive pulmonary disease) ([place]) 04/08/2021   CHF (congestive heart failure) ([place]) 03/25/2021   PAF (paroxysmal atrial  fibrillation) ([place]) 03/20/2021   Chronic diastolic CHF (congestive heart failure) ([place]) 03/20/2021   Alcohol abuse 03/20/2021   Vision loss of left eye 03/20/2021   SOB (shortness of breath) 03/20/2021   Shortness of breath 03/10/2021   Abscess of buttock 03/02/2021   Fluid overload 03/02/2021   Multifocal pneumonia 01/13/2021   Current mild episode of major depressive disorder without prior episode ([place]) 04/09/2020   Atrial fibrillation with RVR ([place]) 04/04/2020   Non-compliance with renal dialysis ([place]) 03/21/2020   Acute CHF (congestive heart failure) ([place]) 03/12/2020   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 ([place]) 02/03/2020   Acute on chronic combined systolic and diastolic CHF (congestive heart failure) ([place]) 02/03/2020   Acute respiratory failure with hypoxia ([place]) 11/12/2019   Aortic atherosclerosis ([place]) 11/12/2019   Emphysema of lung ([place]) 11/12/2019   Acute on chronic congestive heart failure ([place]) 10/22/2019   Volume overload 10/05/2019   Elevated troponin 05/17/2018   Anemia in ESRD (end-stage renal disease) ([place]) 05/17/2018   ESRD on dialysis ([place]) 05/26/2017   Essential hypertension 05/26/2017   Moderate protein-calorie malnutrition ([place]) 08/13/2016   Secondary hyperparathyroidism of renal origin ([place]) 08/13/2016   Tobacco abuse 08/04/2016    Past Surgical History:  Procedure Laterality Date   BASCILIC VEIN TRANSPOSITION Left 08/07/2016   Procedure: LEFT BASILIC VEIN TRANSPOSITION;  Surgeon: Angelia Mould, MD;  Location: [place];  Service: Vascular;  Laterality: Left;   CLOSED REDUCTION  NASAL FRACTURE N/A 08/11/2019   Procedure: CLOSED REDUCTION NASAL FRACTURE WITH STABILIZATION;  Surgeon: Irene Limbo, MD;  Location: [place];  Service: Plastics;  Laterality: N/A;   COLONOSCOPY N/A 08/12/2016   Procedure: COLONOSCOPY;  Surgeon: Otis Brace, MD;  Location: [place];  Service: Gastroenterology;  Laterality: N/A;    ESOPHAGOGASTRODUODENOSCOPY N/A 08/12/2016   Procedure: ESOPHAGOGASTRODUODENOSCOPY (EGD);  Surgeon: Otis Brace, MD;  Location: [place];  Service: Gastroenterology;  Laterality: N/A;   INSERTION OF DIALYSIS CATHETER N/A 08/07/2016   Procedure: INSERTION OF TUNNELED DIALYSIS CATHETER;  Surgeon: Angelia Mould, MD;  Location: [place];  Service: Vascular;  Laterality: N/A;   LIGATION OF ARTERIOVENOUS  FISTULA Left 09/12/2016   Procedure: BANDING OF LEFT  ARTERIOVENOUS  FISTULA;  Surgeon: Angelia Mould, MD;  Location: [place];  Service: Vascular;  Laterality: Left;   LOWER EXTREMITY INTERVENTION Right 12/02/2018   Procedure: LOWER EXTREMITY INTERVENTION;  Surgeon: Algernon Huxley, MD;  Location: [facility];  Service: Cardiovascular;  Laterality: Right;       Family History  Problem Relation Age of Onset   Diabetes Mother    Kidney failure Mother    Healthy Father    Kidney failure Brother    Healthy Sister    Kidney disease Daughter    Post-traumatic stress disorder Neg Hx    Bladder Cancer Neg Hx    Kidney cancer Neg Hx     Social History   Tobacco Use   Smoking status: Every Day    Packs/day: 0.50    Years: 40.00    Pack years: 20.00    Types: Cigarettes   Smokeless tobacco: Never  Vaping Use   Vaping Use: Never used  Substance Use Topics   Alcohol use: Yes    Alcohol/week: 21.0 standard drinks    Types: 21 Cans of beer per week    Comment: last drink 6 months ago   Drug use: No    Home Medications Prior to Admission medications   Medication Sig Start Date End Date Taking? Authorizing Provider  albuterol (VENTOLIN HFA) 108 (90 Base) MCG/ACT inhaler Inhale 2 puffs into the lungs every 4 (four) hours as needed for wheezing or shortness of breath. 05/04/21   Aline August, MD  calcium acetate (PHOSLO) 667 MG capsule Take 2 capsules (1,334 mg total) by mouth 3 (three) times daily with meals. 03/11/21   Lorella Nimrod, MD  cinacalcet (SENSIPAR) 30 MG  tablet Take 1 tablet (30 mg total) by mouth daily with breakfast. 05/04/21   Aline August, MD  epoetin alfa (EPOGEN) 4000 UNIT/ML injection Inject 1 mL (4,000 Units total) into the vein every Monday, Wednesday, and Friday with hemodialysis. 06/22/20   Enzo Bi, MD  guaiFENesin (MUCINEX) 600 MG 12 hr tablet Take 1 tablet (600 mg total) by mouth 2 (two) times daily. 05/04/21   Aline August, MD  hydrALAZINE (APRESOLINE) 50 MG tablet Take 50 mg by mouth 3 (three) times daily.    [provider]  hydrOXYzine (ATARAX/VISTARIL) 25 MG tablet Take 1 tablet (25 mg total) by mouth 3 (three) times daily. 05/04/21   Aline August, MD  Lidocaine 3 % CREA Apply 1 application topically 2 (two) times daily as needed (pain). 02/26/21   Cuthriell, Charline Bills, PA-C  thiamine 100 MG tablet Take 1 tablet (100 mg total) by mouth daily. 03/11/21   Lorella Nimrod, MD  triamcinolone cream (KENALOG) 0.1 % Apply 1 application topically 2 (two) times daily. 04/09/21   [provider]    Allergies    Lisinopril  Review of Systems   Review of Systems  Constitutional:  Negative for diaphoresis and fever.  HENT:  Negative for congestion.   Eyes:  Negative for visual disturbance.  Respiratory:  Positive for cough and shortness of breath.        No hemoptysis  Cardiovascular:  Positive for chest pain. Negative for leg swelling.  Gastrointestinal:  Positive for abdominal distention and constipation. Negative for abdominal pain, diarrhea, nausea and vomiting.       Rectal discharge  Genitourinary:  Positive for decreased urine volume.       Anuric at baseline  Musculoskeletal: Negative.   Skin:  Positive for rash.  Neurological:  Negative for dizziness, syncope, weakness, numbness and headaches.  Hematological: Negative.   Psychiatric/Behavioral: Negative.     Physical Exam Updated Vital Signs BP 123/81   Pulse 97   Temp 97.8 F (36.6 C) (Oral)   Resp 19   SpO2 98%   Physical Exam Vitals and  nursing note reviewed.  Constitutional:      General: He is not in acute distress.    Appearance: He is ill-appearing.  HENT:     Head: Normocephalic and atraumatic.  Eyes:     General: Scleral icterus present.     Conjunctiva/sclera: Conjunctivae normal.  Cardiovascular:     Rate and Rhythm: Regular rhythm. Tachycardia present.     Pulses: Normal pulses.     Heart sounds: No murmur heard.   No friction rub. No gallop.  Pulmonary:     Effort: Pulmonary effort is normal. Tachypnea present. No respiratory distress.     Breath sounds: Rhonchi present. No wheezing or rales.  Chest:     Chest wall: Tenderness present.  Abdominal:     General: Bowel sounds are normal. There is distension.     Palpations: Abdomen is soft.     Tenderness: There is abdominal tenderness. There is no guarding or rebound.  Musculoskeletal:     Right lower leg: No edema.     Left lower leg: No edema.  Skin:    General: Skin is warm and dry.     Findings: Rash present. Rash is crusting.     Comments: Rash over scapula, back, buttocks, and legs. Small <1 cm round lesions that appear excoriated. No palm/sole/mucous membrane involvement.   Neurological:     General: No focal deficit present.     Mental Status: He is alert and oriented to person, place, and time.  Psychiatric:        Mood and Affect: Mood normal.        Behavior: Behavior normal.    ED Results / Procedures / Treatments   Labs (all labs ordered are listed, but only abnormal results are displayed) Labs Reviewed  COMPREHENSIVE METABOLIC PANEL - Abnormal; Notable for the following components:      Result Value   CO2 13 (*)    BUN 92 (*)    Creatinine, Ser 14.96 (*)    Total Protein 8.2 (*)    Albumin 3.3 (*)    GFR, Estimated 4 (*)    Anion gap 26 (*)    All other components within normal limits  CBC WITH DIFFERENTIAL/PLATELET - Abnormal; Notable for the following components:   RBC 2.27 (*)    Hemoglobin 7.2 (*)    HCT 24.9 (*)     MCV 109.7 (*)    MCHC 28.9 (*)    RDW  16.4 (*)    Platelets 105 (*)    Lymphs Abs 0.6 (*)    Abs Immature Granulocytes 0.08 (*)    All other components within normal limits  BRAIN NATRIURETIC PEPTIDE - Abnormal; Notable for the following components:   B Natriuretic Peptide 3,463.3 (*)    All other components within normal limits  TROPONIN I (HIGH SENSITIVITY) - Abnormal; Notable for the following components:   Troponin I (High Sensitivity) 62 (*)    All other components within normal limits  TROPONIN I (HIGH SENSITIVITY) - Abnormal; Notable for the following components:   Troponin I (High Sensitivity) 74 (*)    All other components within normal limits    EKG EKG Interpretation  Date/Time:  Friday May 10 2021 11:54:15 EDT Ventricular Rate:  108 PR Interval:  139 QRS Duration: 87 QT Interval:  378 QTC Calculation: 507 R Axis:   102 Text Interpretation: Sinus tachycardia Right axis deviation Low voltage, precordial leads Borderline T wave abnormalities Prolonged QT interval no sig change from previous Confirmed by Charlesetta Shanks [id number]) on 05/10/2021 12:31:19 PM  Radiology DG Chest 2 View  Result Date: 05/10/2021 CLINICAL DATA:  Dyspnea EXAM: CHEST - 2 VIEW COMPARISON:  Radiograph 05/04/2021, chest CT 05/03/2021 FINDINGS: Unchanged, enlarged cardiac silhouette with a globular appearance. Diffuse interstitial and bilateral airspace opacities. No large pleural effusion or visible pneumothorax. No acute osseous abnormality. Postsurgical changes involving the left upper extremity. IMPRESSION: Unchanged large heart with appearance compatible with pericardial effusion. Diffuse interstitial opacities with bilateral airspace disease compatible with mild pulmonary edema. Electronically Signed   By: Maurine Simmering M.D.   On: 05/10/2021 13:02    Procedures Procedures   Medications Ordered in ED Medications  Chlorhexidine Gluconate Cloth 2 % PADS 6 each (has no administration in time range)   calcitRIOL (ROCALTROL) capsule 1.5 mcg (has no administration in time range)  Darbepoetin Alfa (ARANESP) injection 200 mcg (has no administration in time range)    ED Course  I have reviewed the triage vital signs and the nursing notes.  Pertinent labs & imaging results that were available during my care of the patient were reviewed by me and considered in my medical decision making (see chart for details).    MDM Rules/Calculators/A&P                          This is a patient with a history of ESRD requiring dialysis that presents with shortness of breath with associated chest pain. He was recently discharged on 8/13 for volume overload in the setting of missing dialysis and was found to be COVID positive. He has multiple hospitalizations and ED visits over the past several months.  He has not been dialyzed since his last admission. Initially on 4L of O2, this was removed- remains with SPO2 > 95% on RA.   Additional history obtained:  Additional history obtained from chart review & nursing note review.   Lab Tests:  I Ordered, reviewed, and interpreted labs which included:  Hgb 7.2 which is at baseline. Creatinine 14.6 at baseline. LFTs normal. Bicarb and high anion gap suggestive of metabolic acidosis. BNP elevated. Troponin mildly increased  EKG shows no significant change from previous.  CXR with cardiomegaly but unchanged from prior. Some mild pulmonary edema suggested.  Imaging Studies ordered:  I ordered imaging studies which included CT abdomen/chest WO, I independently reviewed, formal radiology impression shows: pending  ED Course:  On presentation to the ED,  he is slightly tachycardic and tachypnic. He is oxygenating well on RA. On exam, he looks uncomfortable but in no acute distress.  Exam notable for productive cough, rhonchi lung sounds, distended abdomen with generalized tenderness. EKG without acute ischemia compared to prior- initial troponin is mildly elevated but  is improved from prior on record for this patient. His chest pain is reproducible with coughing, deep breathing, and palpation.  Findings are suggestive of volume overload in setting of missed dialysis. Ordered CT abdomen pelvis considering significant abdominal distension and mucous rectal discharge.  Per nephrology, non IV contrast CT scan recommended. Nephrology consulted and plan to dialyze patient in ED while awaiting work up to be complete.   Sign out to Rona Ravens, PA-C who will take over for the remainder of this patient's care.   Final Clinical Impression(s) / ED Diagnoses Final diagnoses:  None    Rx / DC Orders ED Discharge Orders     None        Adolphus Birchwood, PA-C 05/10/21 1541    Charlesetta Shanks, MD 05/26/21 1437

## 2021-05-10 NOTE — ED Notes (Signed)
Pt transported to dialysis, [facility]

## 2021-05-10 NOTE — ED Notes (Signed)
Pt started first bottle of contrast

## 2021-05-10 NOTE — Discharge Instructions (Addendum)
Please resume dialysis schedule as previously scheduled.  Follow-up with your doctor for further care.  Your CT scan today did not show any new concerning finding.

## 2021-05-10 NOTE — ED Triage Notes (Signed)
Pt comes from home via EMS, EMS called out due to sob with exertion, EMS reports first responders had pt on NRB O2 was 98. NRB dc'd. Pt has hx of chf and dialysis. (M,W,F) Pt missed dialysis today, Pt reports last dialysis was "at a hospital" per EMS. Pt alert and oriented x4. BP 160/90 HR 94 RR 18 O2 99% 4L [place] BGL 126

## 2021-05-10 NOTE — ED Notes (Signed)
Pt will not keep cardiac monitor on. Pt educated. RN aware.

## 2021-05-11 DIAGNOSIS — R0602 Shortness of breath: Secondary | ICD-10-CM | POA: Diagnosis not present

## 2021-05-11 MED ORDER — DARBEPOETIN ALFA 200 MCG/0.4ML IJ SOSY
PREFILLED_SYRINGE | INTRAMUSCULAR | Status: AC
  Administered 2021-05-11: 200 ug via INTRAVENOUS
  Filled 2021-05-11: qty 0.4

## 2021-05-11 MED ORDER — CALCITRIOL 0.5 MCG PO CAPS
ORAL_CAPSULE | ORAL | Status: AC
  Administered 2021-05-11: 1.5 ug via ORAL
  Filled 2021-05-11: qty 3

## 2021-05-11 NOTE — ED Notes (Signed)
Pt to sleep out in waiting room until the am per charge.  Security and waiting room staff notified.  Pt placed in large chair and given snacks.

## 2021-05-22 ENCOUNTER — Emergency Department: Payer: Medicare Other

## 2021-05-22 ENCOUNTER — Other Ambulatory Visit: Payer: Self-pay

## 2021-05-22 ENCOUNTER — Encounter: Payer: Self-pay | Admitting: *Deleted

## 2021-05-22 DIAGNOSIS — L299 Pruritus, unspecified: Secondary | ICD-10-CM | POA: Insufficient documentation

## 2021-05-22 DIAGNOSIS — I132 Hypertensive heart and chronic kidney disease with heart failure and with stage 5 chronic kidney disease, or end stage renal disease: Secondary | ICD-10-CM | POA: Insufficient documentation

## 2021-05-22 DIAGNOSIS — Z992 Dependence on renal dialysis: Secondary | ICD-10-CM | POA: Insufficient documentation

## 2021-05-22 DIAGNOSIS — N186 End stage renal disease: Secondary | ICD-10-CM | POA: Diagnosis not present

## 2021-05-22 DIAGNOSIS — F17210 Nicotine dependence, cigarettes, uncomplicated: Secondary | ICD-10-CM | POA: Insufficient documentation

## 2021-05-22 DIAGNOSIS — R0602 Shortness of breath: Secondary | ICD-10-CM | POA: Diagnosis present

## 2021-05-22 DIAGNOSIS — J449 Chronic obstructive pulmonary disease, unspecified: Secondary | ICD-10-CM | POA: Insufficient documentation

## 2021-05-22 DIAGNOSIS — I5032 Chronic diastolic (congestive) heart failure: Secondary | ICD-10-CM | POA: Diagnosis not present

## 2021-05-22 DIAGNOSIS — Z8616 Personal history of COVID-19: Secondary | ICD-10-CM | POA: Insufficient documentation

## 2021-05-22 LAB — CBC
HCT: 23.8 % — ABNORMAL LOW (ref 39.0–52.0)
Hemoglobin: 7.6 g/dL — ABNORMAL LOW (ref 13.0–17.0)
MCH: 32.6 pg (ref 26.0–34.0)
MCHC: 31.9 g/dL (ref 30.0–36.0)
MCV: 102.1 fL — ABNORMAL HIGH (ref 80.0–100.0)
Platelets: 149 10*3/uL — ABNORMAL LOW (ref 150–400)
RBC: 2.33 MIL/uL — ABNORMAL LOW (ref 4.22–5.81)
RDW: 16.8 % — ABNORMAL HIGH (ref 11.5–15.5)
WBC: 7.8 10*3/uL (ref 4.0–10.5)
nRBC: 0.3 % — ABNORMAL HIGH (ref 0.0–0.2)

## 2021-05-22 LAB — BASIC METABOLIC PANEL
Anion gap: 22 — ABNORMAL HIGH (ref 5–15)
BUN: 109 mg/dL — ABNORMAL HIGH (ref 6–20)
CO2: 18 mmol/L — ABNORMAL LOW (ref 22–32)
Calcium: 7.8 mg/dL — ABNORMAL LOW (ref 8.9–10.3)
Chloride: 100 mmol/L (ref 98–111)
Creatinine, Ser: 19.29 mg/dL — ABNORMAL HIGH (ref 0.61–1.24)
GFR, Estimated: 3 mL/min — ABNORMAL LOW (ref >60)
Glucose, Bld: 83 mg/dL (ref 70–99)
Potassium: 4.9 mmol/L (ref 3.5–5.1)
Sodium: 140 mmol/L (ref 135–145)

## 2021-05-22 LAB — TROPONIN I (HIGH SENSITIVITY): Troponin I (High Sensitivity): 88 ng/L — ABNORMAL HIGH (ref <18)

## 2021-05-22 LAB — BRAIN NATRIURETIC PEPTIDE: B Natriuretic Peptide: >4500 pg/mL — ABNORMAL HIGH (ref 0.0–100.0)

## 2021-05-22 IMAGING — CR DG CHEST 2V
2 series · 2 of 2 positions shown · non-contrast
Comparison: Chest radiograph dated [DATE].

CLINICAL DATA: Chest pain.

EXAM:
CHEST - 2 VIEW

[chest lat]
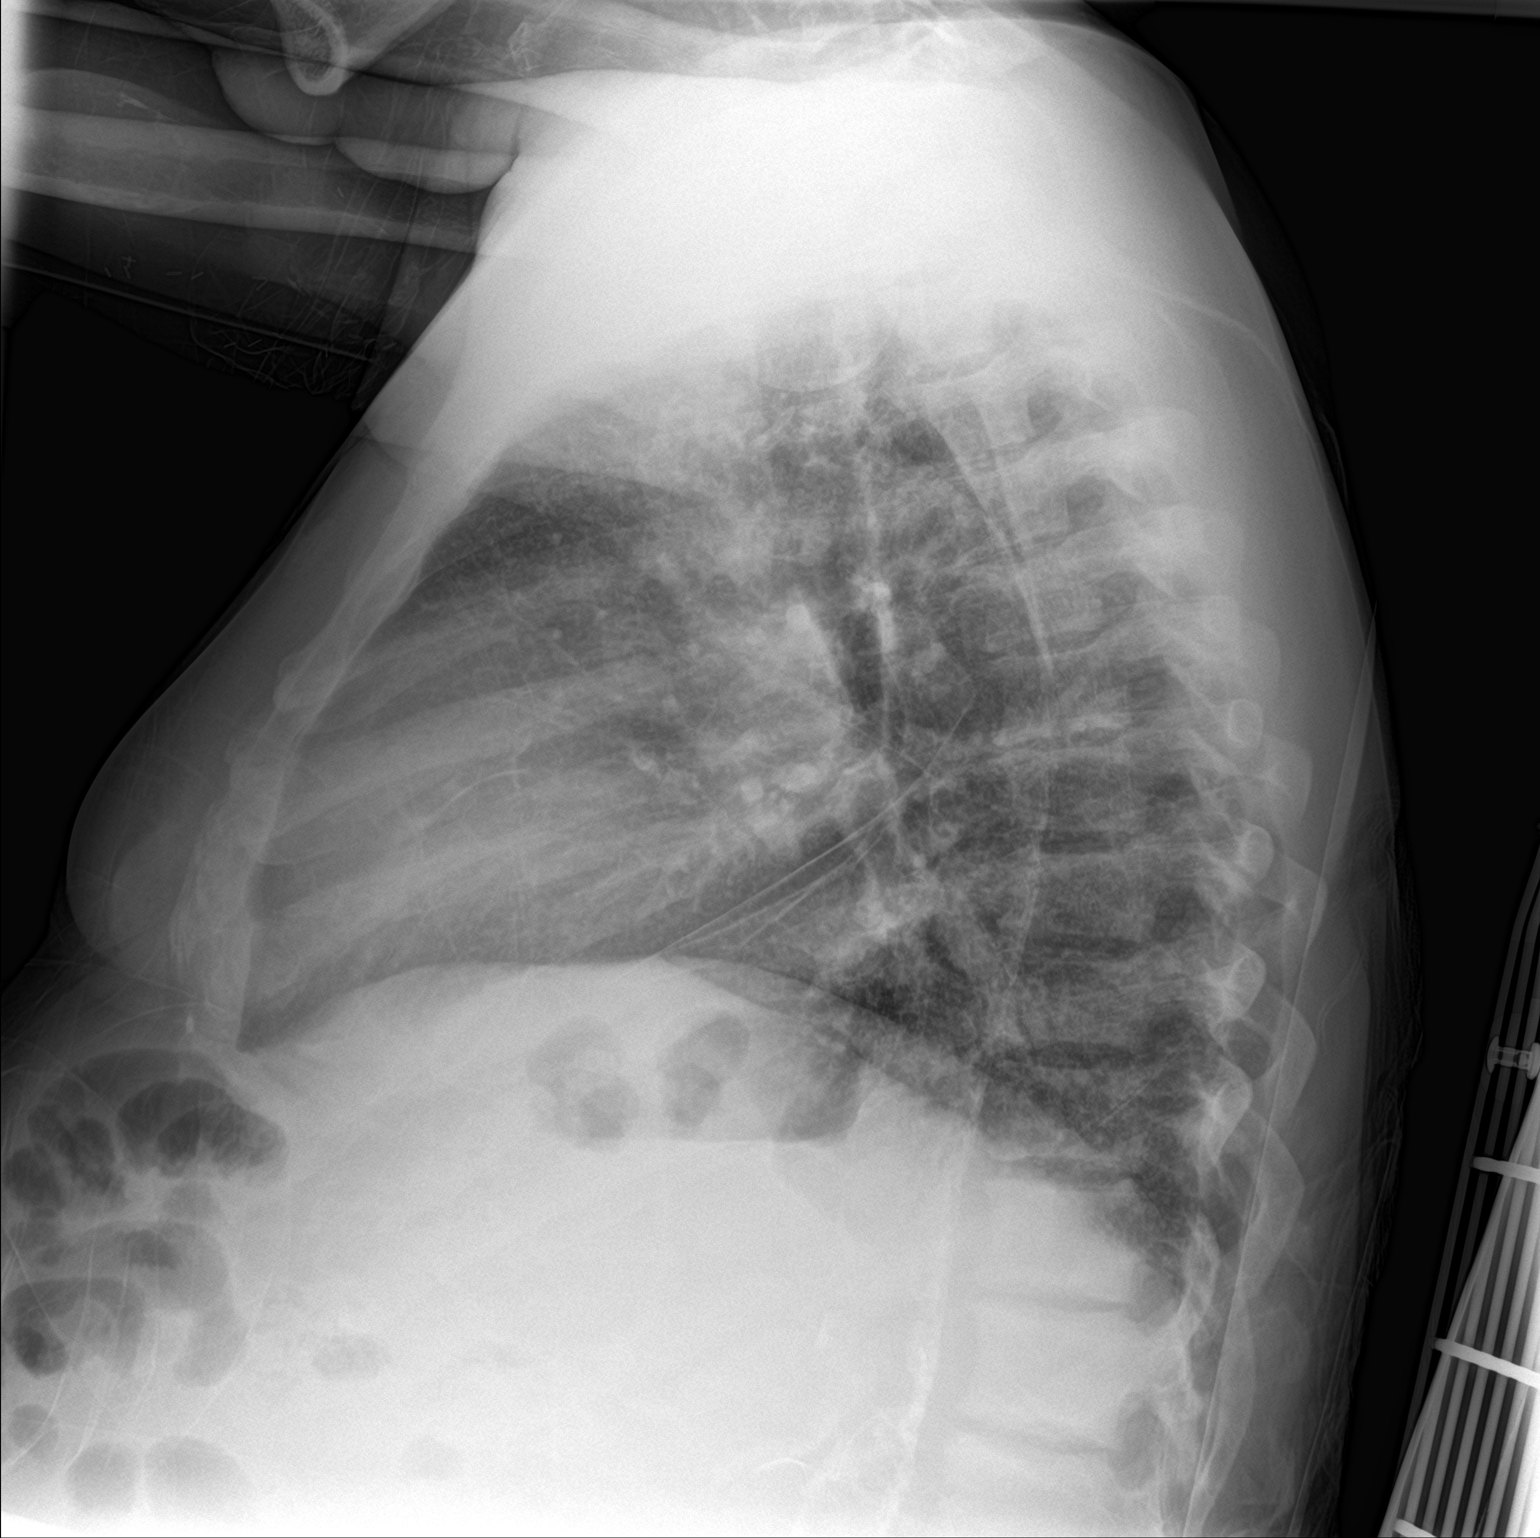

[chest ap]
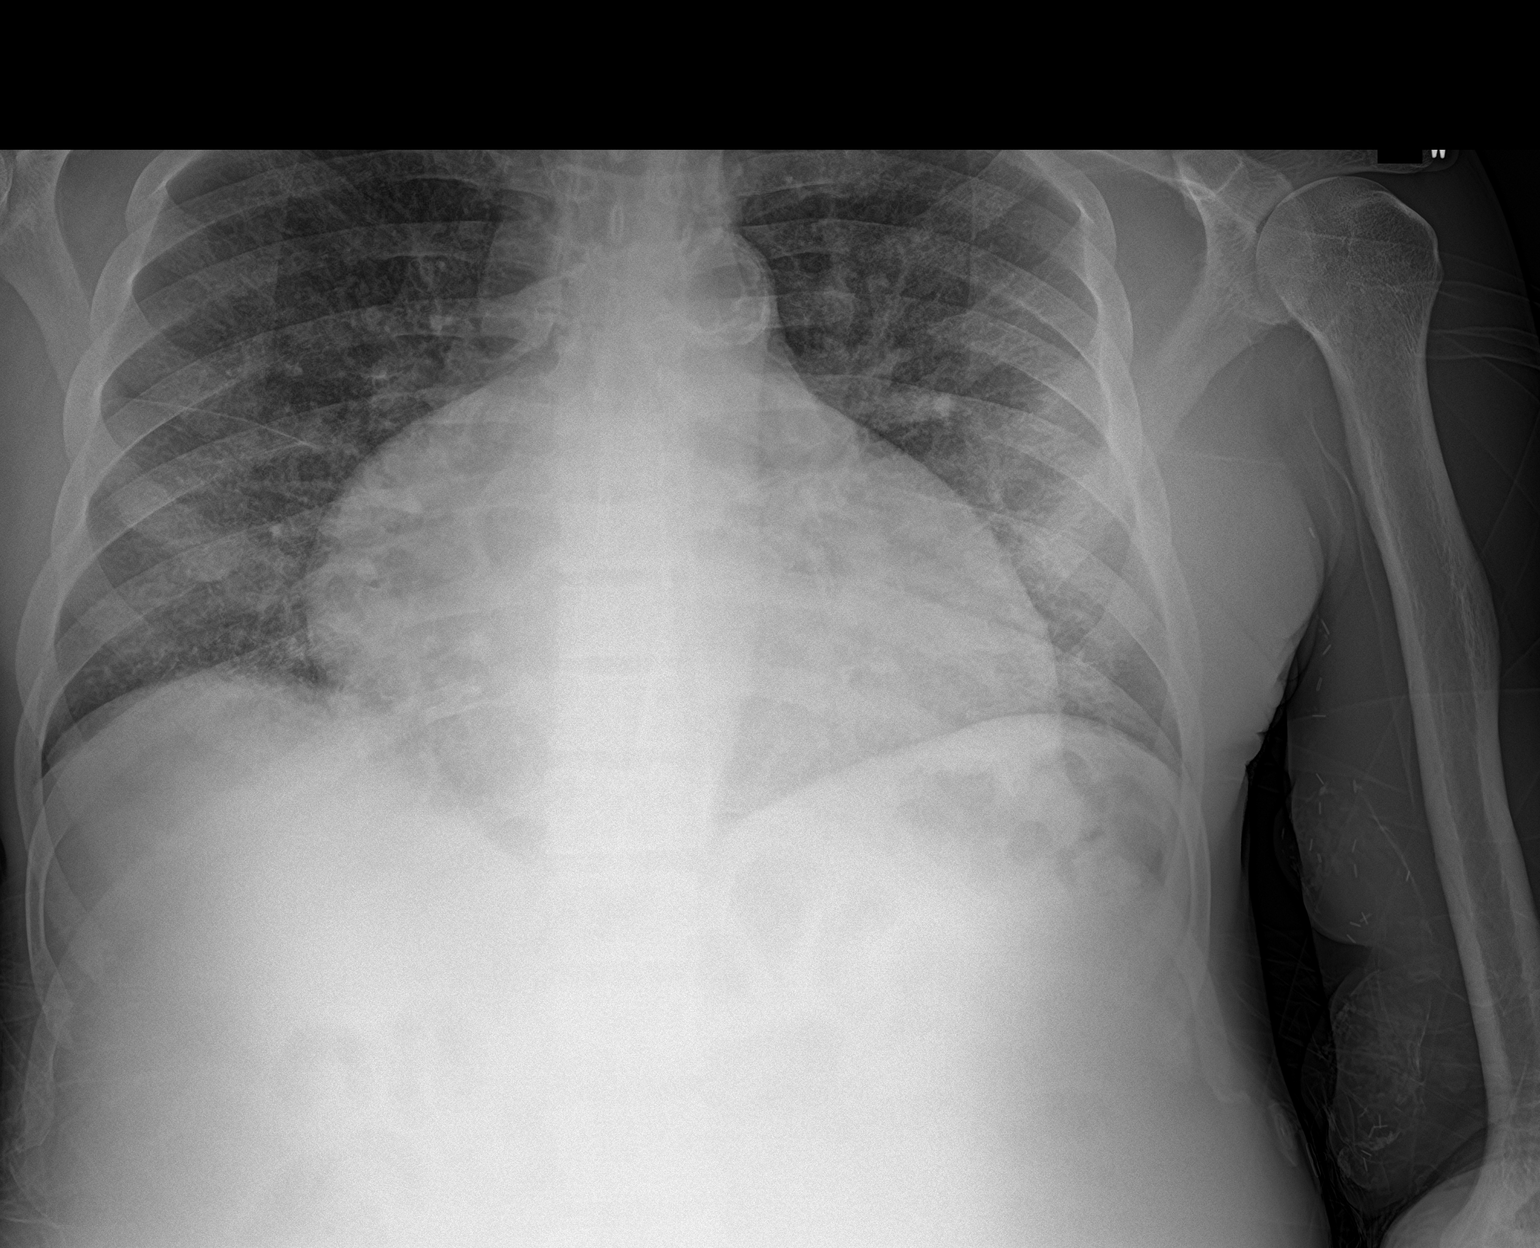

[2 of 2 positions shown; findings below may reference images not displayed]

FINDINGS: There is cardiomegaly. No focal consolidation, pleural effusion
pneumothorax. Atherosclerotic calcification the aorta. No acute
osseous pathology.
IMPRESSION: 1. No acute cardiopulmonary process.
2. Cardiomegaly.

## 2021-05-22 NOTE — ED Triage Notes (Signed)
Pt arrives via [facility] from home per report by medic with c/o Fluid rentention, itching, shortness of breath. Taking otc meds for itching. 148/96, hr 94, 96% ra, cbg 92

## 2021-05-22 NOTE — ED Triage Notes (Signed)
Pt is MWF dialysis, did go on Tuesday this week. Feels like he has a lot of fluid in his stomach and leg for about a day or two. More SOB. Coughing up yellow mucous.

## 2021-05-23 ENCOUNTER — Emergency Department
Admission: EM | Admit: 2021-05-23 | Discharge: 2021-05-23 | Disposition: A | Discharge status: Home / Self Care | Payer: Medicare Other | Attending: Emergency Medicine | Admitting: Emergency Medicine

## 2021-05-23 DIAGNOSIS — R0602 Shortness of breath: Secondary | ICD-10-CM

## 2021-05-23 DIAGNOSIS — I509 Heart failure, unspecified: Secondary | ICD-10-CM

## 2021-05-23 DIAGNOSIS — I132 Hypertensive heart and chronic kidney disease with heart failure and with stage 5 chronic kidney disease, or end stage renal disease: Secondary | ICD-10-CM | POA: Diagnosis not present

## 2021-05-23 DIAGNOSIS — L299 Pruritus, unspecified: Secondary | ICD-10-CM

## 2021-05-23 DIAGNOSIS — N186 End stage renal disease: Secondary | ICD-10-CM

## 2021-05-23 MED ORDER — LIDOCAINE 3 % EX CREA: 1.0000 "application " | TOPICAL_CREAM | Freq: Three times a day (TID) | CUTANEOUS | 0 refills | Status: DC | PRN

## 2021-05-23 MED ORDER — TRIAMCINOLONE ACETONIDE 0.1 % EX CREA: 1.0000 "application " | TOPICAL_CREAM | Freq: Two times a day (BID) | CUTANEOUS | 0 refills | Status: DC

## 2021-05-23 MED ORDER — CALCIUM ACETATE (PHOS BINDER) 667 MG PO CAPS
1334.0000 mg | ORAL_CAPSULE | Freq: Once | ORAL | Status: AC
  Administered 2021-05-23: 1334 mg via ORAL
  Filled 2021-05-23: qty 2

## 2021-05-23 MED ORDER — CALCIUM ACETATE (PHOS BINDER) 667 MG PO CAPS: 1334.0000 mg | ORAL_CAPSULE | Freq: Three times a day (TID) | ORAL | 2 refills | Status: DC

## 2021-05-23 NOTE — ED Notes (Signed)
Pt refusing 2nd blood draw at this time

## 2021-05-23 NOTE — ED Provider Notes (Signed)
[facility] Emergency Department Provider Note   ____________________________________________   Event Date/Time   First MD Initiated Contact with Patient 05/23/21 [phone]     (approximate)  I have reviewed the triage vital signs and the nursing notes.   HISTORY  Chief Complaint Shortness of Breath    HPI Jordan [name] is a 55 y.o. male who presents via EMS complaining of itching  LOCATION: Generalized DURATION: Years TIMING: Worsened over the past 3 days SEVERITY: Severe QUALITY: Itching CONTEXT: Patient is end-stage renal disease on dialysis patient MWF who states that he is unable to visit his for many of his dialysis sessions due to increased itching.  Patient has been taking over-the-counter Benadryl for this but has not been taking his home PhosLo MODIFYING FACTORS: Denies any exacerbating or relieving factors ASSOCIATED SYMPTOMS: Fluid retention and dyspnea on exertion   Per medical record review, patient has ESRD on dialysis MWF          Past Medical History:  Diagnosis Date   Anemia    Aortic atherosclerosis ([place]) 11/12/2019   CKD (chronic kidney disease)    Stage 5  Dialysis - M/W/F in [place], [place]   Dyspnea    tx with inhaler when sick   ED (erectile dysfunction)    Emphysema of lung ([place]) 11/12/2019   ETOH abuse    History of blood transfusion    Hypertension    Wears dentures     Patient Active Problem List   Diagnosis Date Noted   Pulmonary edema 04/29/2021   Pruritus 04/29/2021   COVID-19 virus infection 04/29/2021   Thrombocytopenia ([place]) 04/08/2021   COPD (chronic obstructive pulmonary disease) ([place]) 04/08/2021   CHF (congestive heart failure) ([place]) 03/25/2021   PAF (paroxysmal atrial fibrillation) ([place]) 03/20/2021   Chronic diastolic CHF (congestive heart failure) ([place]) 03/20/2021   Alcohol abuse 03/20/2021   Vision loss of left eye 03/20/2021   SOB (shortness of breath) 03/20/2021   Shortness of breath  03/10/2021   Abscess of buttock 03/02/2021   Fluid overload 03/02/2021   Multifocal pneumonia 01/13/2021   Current mild episode of major depressive disorder without prior episode ([place]) 04/09/2020   Atrial fibrillation with RVR ([place]) 04/04/2020   Non-compliance with renal dialysis ([place]) 03/21/2020   Acute CHF (congestive heart failure) ([place]) 03/12/2020   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 ([place]) 02/03/2020   Acute on chronic combined systolic and diastolic CHF (congestive heart failure) ([place]) 02/03/2020   Acute respiratory failure with hypoxia ([place]) 11/12/2019   Aortic atherosclerosis ([place]) 11/12/2019   Emphysema of lung ([place]) 11/12/2019   Acute on chronic congestive heart failure ([place]) 10/22/2019   Volume overload 10/05/2019   Elevated troponin 05/17/2018   Anemia in ESRD (end-stage renal disease) ([place]) 05/17/2018   ESRD on dialysis ([place]) 05/26/2017   Essential hypertension 05/26/2017   Moderate protein-calorie malnutrition ([place]) 08/13/2016   Secondary hyperparathyroidism of renal origin ([place]) 08/13/2016   Tobacco abuse 08/04/2016    Past Surgical History:  Procedure Laterality Date   BASCILIC VEIN TRANSPOSITION Left 08/07/2016   Procedure: LEFT BASILIC VEIN TRANSPOSITION;  Surgeon: Angelia Mould, MD;  Location: [facility];  Service: Vascular;  Laterality: Left;   CLOSED REDUCTION NASAL FRACTURE N/A 08/11/2019   Procedure: CLOSED REDUCTION NASAL FRACTURE WITH STABILIZATION;  Surgeon: Irene Limbo, MD;  Location: [facility];  Service: Plastics;  Laterality: N/A;   COLONOSCOPY N/A 08/12/2016   Procedure: COLONOSCOPY;  Surgeon: Otis Brace, MD;  Location: [facility];  Service: Gastroenterology;  Laterality: N/A;   ESOPHAGOGASTRODUODENOSCOPY N/A 08/12/2016   Procedure: ESOPHAGOGASTRODUODENOSCOPY (EGD);  Surgeon: Otis Brace, MD;  Location: [facility];  Service: Gastroenterology;  Laterality: N/A;   INSERTION OF DIALYSIS CATHETER N/A 08/07/2016    Procedure: INSERTION OF TUNNELED DIALYSIS CATHETER;  Surgeon: Angelia Mould, MD;  Location: [facility];  Service: Vascular;  Laterality: N/A;   LIGATION OF ARTERIOVENOUS  FISTULA Left 09/12/2016   Procedure: BANDING OF LEFT  ARTERIOVENOUS  FISTULA;  Surgeon: Angelia Mould, MD;  Location: [facility];  Service: Vascular;  Laterality: Left;   LOWER EXTREMITY INTERVENTION Right 12/02/2018   Procedure: LOWER EXTREMITY INTERVENTION;  Surgeon: Algernon Huxley, MD;  Location: [facility];  Service: Cardiovascular;  Laterality: Right;    Prior to Admission medications   Medication Sig Start Date End Date Taking? Authorizing Provider  albuterol (VENTOLIN HFA) 108 (90 Base) MCG/ACT inhaler Inhale 2 puffs into the lungs every 4 (four) hours as needed for wheezing or shortness of breath. 05/04/21   Aline August, MD  calcium acetate (PHOSLO) 667 MG capsule Take 2 capsules (1,334 mg total) by mouth 3 (three) times daily with meals. 05/23/21   Naaman Plummer, MD  cinacalcet (SENSIPAR) 30 MG tablet Take 1 tablet (30 mg total) by mouth daily with breakfast. 05/04/21   Aline August, MD  epoetin alfa (EPOGEN) 4000 UNIT/ML injection Inject 1 mL (4,000 Units total) into the vein every Monday, Wednesday, and Friday with hemodialysis. 06/22/20   Enzo Bi, MD  guaiFENesin (MUCINEX) 600 MG 12 hr tablet Take 1 tablet (600 mg total) by mouth 2 (two) times daily. 05/04/21   Aline August, MD  hydrALAZINE (APRESOLINE) 50 MG tablet Take 50 mg by mouth 3 (three) times daily.    [provider]  hydrOXYzine (ATARAX/VISTARIL) 25 MG tablet Take 1 tablet (25 mg total) by mouth 3 (three) times daily. 05/04/21   Aline August, MD  Lidocaine 3 % CREA Apply 1 application topically 2 (two) times daily as needed (pain). 02/26/21   Cuthriell, Charline Bills, PA-C  thiamine 100 MG tablet Take 1 tablet (100 mg total) by mouth daily. 03/11/21   Lorella Nimrod, MD  triamcinolone cream (KENALOG) 0.1 % Apply 1 application topically 2  (two) times daily. 04/09/21   [provider]    Allergies Lisinopril  Family History  Problem Relation Age of Onset   Diabetes Mother    Kidney failure Mother    Healthy Father    Kidney failure Brother    Healthy Sister    Kidney disease Daughter    Post-traumatic stress disorder Neg Hx    Bladder Cancer Neg Hx    Kidney cancer Neg Hx     Social History Social History   Tobacco Use   Smoking status: Every Day    Packs/day: 0.50    Years: 40.00    Pack years: 20.00    Types: Cigarettes   Smokeless tobacco: Never  Vaping Use   Vaping Use: Never used  Substance Use Topics   Alcohol use: Yes    Alcohol/week: 21.0 standard drinks    Types: 21 Cans of beer per week    Comment: last drink 6 months ago   Drug use: No    Review of Systems Constitutional: No fever/chills Eyes: No visual changes. ENT: No sore throat. Cardiovascular: Denies chest pain. Respiratory: Endorses shortness of breath. Gastrointestinal: No abdominal pain.  No nausea, no vomiting.  No diarrhea. Genitourinary: Negative for dysuria. Musculoskeletal: Negative  for acute arthralgias Skin: Endorses generalized itching and bilateral lower extremity edema.  Negative for rash. Neurological: Negative for headaches, weakness/numbness/paresthesias in any extremity Psychiatric: Negative for suicidal ideation/homicidal ideation   ____________________________________________   PHYSICAL EXAM:  VITAL SIGNS: ED Triage Vitals  Enc Vitals Group     BP 05/22/21 2256 (!) 147/97     Pulse Rate 05/22/21 2256 99     Resp 05/22/21 2256 (!) 22     Temp 05/22/21 2256 97.9 F (36.6 C)     Temp Source 05/22/21 2256 Oral     SpO2 05/22/21 2256 100 %     Weight --      Height 05/22/21 2255 '5\' 4"'$  (1.626 m)     Head Circumference --      Peak Flow --      Pain Score 05/22/21 2254 8     Pain Loc --      Pain Edu? --      Excl. in [place]? --    Constitutional: Alert and oriented. Well appearing and in no  acute distress. Eyes: Conjunctivae are normal. PERRL. Head: Atraumatic. Nose: No congestion/rhinnorhea. Mouth/Throat: Mucous membranes are moist. Neck: No stridor Cardiovascular: Grossly normal heart sounds.  Good peripheral circulation. Respiratory: Normal respiratory effort.  No retractions. Gastrointestinal: Soft and nontender. No distention. Musculoskeletal: No obvious deformities Neurologic:  Normal speech and language. No gross focal neurologic deficits are appreciated. Skin: Multiple areas of excoriation with 1+ pitting edema to bilateral lower extremities.  Skin is warm and dry. No rash noted. Psychiatric: Mood and affect are normal. Speech and behavior are normal.  ____________________________________________   LABS (all labs ordered are listed, but only abnormal results are displayed)  Labs Reviewed  BASIC METABOLIC PANEL - Abnormal; Notable for the following components:      Result Value   CO2 18 (*)    BUN 109 (*)    Creatinine, Ser 19.29 (*)    Calcium 7.8 (*)    GFR, Estimated 3 (*)    Anion gap 22 (*)    All other components within normal limits  CBC - Abnormal; Notable for the following components:   RBC 2.33 (*)    Hemoglobin 7.6 (*)    HCT 23.8 (*)    MCV 102.1 (*)    RDW 16.8 (*)    Platelets 149 (*)    nRBC 0.3 (*)    All other components within normal limits  BRAIN NATRIURETIC PEPTIDE - Abnormal; Notable for the following components:   B Natriuretic Peptide >4,500.0 (*)    All other components within normal limits  TROPONIN I (HIGH SENSITIVITY) - Abnormal; Notable for the following components:   Troponin I (High Sensitivity) 88 (*)    All other components within normal limits  TROPONIN I (HIGH SENSITIVITY)   ____________________________________________  EKG  ED ECG REPORT I, Naaman Plummer, the attending physician, personally viewed and interpreted this ECG.  Date: 05/23/2021 EKG Time: 2303 Rate: 98 Rhythm: normal sinus rhythm QRS Axis:  normal Intervals: normal ST/T Wave abnormalities: normal Narrative Interpretation: no evidence of acute ischemia  ____________________________________________  RADIOLOGY  ED MD interpretation: 2 view chest x-ray shows no evidence of acute abnormalities including no pneumonia, pneumothorax, or widened mediastinum.  Stable cardiomegaly  Official radiology report(s): DG Chest 2 View  Result Date: 05/22/2021 CLINICAL DATA:  Chest pain. EXAM: CHEST - 2 VIEW COMPARISON:  Chest radiograph dated 05/10/2021. FINDINGS: There is cardiomegaly. No focal consolidation, pleural effusion pneumothorax. Atherosclerotic calcification the aorta.  No acute osseous pathology. IMPRESSION: 1. No acute cardiopulmonary process. 2. Cardiomegaly. Electronically Signed   By: Anner Crete M.D.   On: 05/22/2021 23:29    ____________________________________________   PROCEDURES  Procedure(s) performed (including Critical Care):  .1-3 Lead EKG Interpretation  Date/Time: 05/23/2021 3:24 AM Performed by: Naaman Plummer, MD Authorized by: Naaman Plummer, MD     Interpretation: normal     ECG rate:  82   ECG rate assessment: normal     Rhythm: sinus rhythm     Ectopy: none     Conduction: normal     ____________________________________________   INITIAL IMPRESSION / ASSESSMENT AND PLAN / ED COURSE  As part of my medical decision making, I reviewed the following data within the electronic medical record, if available:  Nursing notes reviewed and incorporated, Labs reviewed, EKG interpreted, Old chart reviewed, Radiograph reviewed and Notes from prior ED visits reviewed and incorporated       + dyspnea +LE edema + Non adherence to medication regimen  Workup: ECG, CBC, BMP, Troponin, BNP, CXR Findings: EKG: No STEMI and no evidence of Brugadas sign, delta wave, epsilon wave, significantly prolonged QTc, or malignant arrhythmia. BNP: >4000 CXR: Cardiomegaly Based on history, exam and findings,  presentation most consistent with acute on chronic heart failure. Low suspicion for PNA, ACS, tamponade, aortic dissection. Interventions: Follows low  Reassessment: Symptoms improved in ED with oxygen and diuresis  Disposition (Stable): Discharge      ____________________________________________   FINAL CLINICAL IMPRESSION(S) / ED DIAGNOSES  Final diagnoses:  SOB (shortness of breath)  Chronic congestive heart failure, unspecified heart failure type ([place])  ESRD (end stage renal disease) on dialysis ([place])  Pruritus of skin     ED Discharge Orders          Ordered    calcium acetate (PHOSLO) 667 MG capsule  3 times daily with meals        05/23/21 0246             Note:  This document was prepared using Dragon voice recognition software and may include unintentional dictation errors.    Naaman Plummer, MD 05/23/21 [phone]

## 2021-05-23 NOTE — ED Notes (Signed)
Pt given cereal for medication and instructed on discharge. Pt to wait in lobby for cab to arrive.

## 2021-05-28 ENCOUNTER — Inpatient Hospital Stay
Admission: EM | Admit: 2021-05-28 | Discharge: 2021-05-30 | DRG: 640 | Discharge status: Against Medical Advice | Payer: Medicare Other | Attending: Internal Medicine | Admitting: Internal Medicine

## 2021-05-28 ENCOUNTER — Other Ambulatory Visit: Payer: Self-pay

## 2021-05-28 ENCOUNTER — Emergency Department: Payer: Medicare Other

## 2021-05-28 ENCOUNTER — Encounter: Payer: Self-pay | Admitting: Emergency Medicine

## 2021-05-28 DIAGNOSIS — R0602 Shortness of breath: Secondary | ICD-10-CM

## 2021-05-28 DIAGNOSIS — J441 Chronic obstructive pulmonary disease with (acute) exacerbation: Secondary | ICD-10-CM | POA: Diagnosis not present

## 2021-05-28 DIAGNOSIS — D631 Anemia in chronic kidney disease: Secondary | ICD-10-CM | POA: Diagnosis present

## 2021-05-28 DIAGNOSIS — E8770 Fluid overload, unspecified: Principal | ICD-10-CM

## 2021-05-28 DIAGNOSIS — Z992 Dependence on renal dialysis: Secondary | ICD-10-CM | POA: Diagnosis not present

## 2021-05-28 DIAGNOSIS — F17210 Nicotine dependence, cigarettes, uncomplicated: Secondary | ICD-10-CM | POA: Diagnosis present

## 2021-05-28 DIAGNOSIS — F1010 Alcohol abuse, uncomplicated: Secondary | ICD-10-CM | POA: Diagnosis present

## 2021-05-28 DIAGNOSIS — G4733 Obstructive sleep apnea (adult) (pediatric): Secondary | ICD-10-CM | POA: Diagnosis present

## 2021-05-28 DIAGNOSIS — I132 Hypertensive heart and chronic kidney disease with heart failure and with stage 5 chronic kidney disease, or end stage renal disease: Secondary | ICD-10-CM | POA: Diagnosis present

## 2021-05-28 DIAGNOSIS — Z720 Tobacco use: Secondary | ICD-10-CM | POA: Diagnosis not present

## 2021-05-28 DIAGNOSIS — J449 Chronic obstructive pulmonary disease, unspecified: Secondary | ICD-10-CM | POA: Diagnosis present

## 2021-05-28 DIAGNOSIS — D696 Thrombocytopenia, unspecified: Secondary | ICD-10-CM | POA: Diagnosis present

## 2021-05-28 DIAGNOSIS — L299 Pruritus, unspecified: Secondary | ICD-10-CM | POA: Diagnosis present

## 2021-05-28 DIAGNOSIS — R778 Other specified abnormalities of plasma proteins: Secondary | ICD-10-CM | POA: Diagnosis not present

## 2021-05-28 DIAGNOSIS — Z9115 Patient's noncompliance with renal dialysis: Secondary | ICD-10-CM | POA: Diagnosis not present

## 2021-05-28 DIAGNOSIS — Z888 Allergy status to other drugs, medicaments and biological substances status: Secondary | ICD-10-CM | POA: Diagnosis not present

## 2021-05-28 DIAGNOSIS — R0902 Hypoxemia: Secondary | ICD-10-CM

## 2021-05-28 DIAGNOSIS — Z841 Family history of disorders of kidney and ureter: Secondary | ICD-10-CM | POA: Diagnosis not present

## 2021-05-28 DIAGNOSIS — Z8616 Personal history of COVID-19: Secondary | ICD-10-CM | POA: Diagnosis not present

## 2021-05-28 DIAGNOSIS — E875 Hyperkalemia: Secondary | ICD-10-CM | POA: Diagnosis present

## 2021-05-28 DIAGNOSIS — Z79899 Other long term (current) drug therapy: Secondary | ICD-10-CM | POA: Diagnosis not present

## 2021-05-28 DIAGNOSIS — I5033 Acute on chronic diastolic (congestive) heart failure: Secondary | ICD-10-CM | POA: Diagnosis present

## 2021-05-28 DIAGNOSIS — J9621 Acute and chronic respiratory failure with hypoxia: Secondary | ICD-10-CM | POA: Diagnosis present

## 2021-05-28 DIAGNOSIS — D649 Anemia, unspecified: Secondary | ICD-10-CM

## 2021-05-28 DIAGNOSIS — I10 Essential (primary) hypertension: Secondary | ICD-10-CM

## 2021-05-28 DIAGNOSIS — J810 Acute pulmonary edema: Secondary | ICD-10-CM

## 2021-05-28 DIAGNOSIS — I480 Paroxysmal atrial fibrillation: Secondary | ICD-10-CM | POA: Diagnosis present

## 2021-05-28 DIAGNOSIS — N186 End stage renal disease: Secondary | ICD-10-CM | POA: Diagnosis present

## 2021-05-28 DIAGNOSIS — Z20822 Contact with and (suspected) exposure to covid-19: Secondary | ICD-10-CM | POA: Diagnosis present

## 2021-05-28 DIAGNOSIS — E8779 Other fluid overload: Secondary | ICD-10-CM

## 2021-05-28 DIAGNOSIS — B1910 Unspecified viral hepatitis B without hepatic coma: Secondary | ICD-10-CM | POA: Diagnosis present

## 2021-05-28 DIAGNOSIS — N2581 Secondary hyperparathyroidism of renal origin: Secondary | ICD-10-CM | POA: Diagnosis present

## 2021-05-28 DIAGNOSIS — J811 Chronic pulmonary edema: Secondary | ICD-10-CM | POA: Diagnosis present

## 2021-05-28 LAB — CBC
HCT: 22.2 % — ABNORMAL LOW (ref 39.0–52.0)
Hemoglobin: 6.9 g/dL — ABNORMAL LOW (ref 13.0–17.0)
MCH: 32.4 pg (ref 26.0–34.0)
MCHC: 31.1 g/dL (ref 30.0–36.0)
MCV: 104.2 fL — ABNORMAL HIGH (ref 80.0–100.0)
Platelets: 150 10*3/uL (ref 150–400)
RBC: 2.13 MIL/uL — ABNORMAL LOW (ref 4.22–5.81)
RDW: 15.9 % — ABNORMAL HIGH (ref 11.5–15.5)
WBC: 9.6 10*3/uL (ref 4.0–10.5)
nRBC: 0 % (ref 0.0–0.2)

## 2021-05-28 LAB — BASIC METABOLIC PANEL
Anion gap: 20 — ABNORMAL HIGH (ref 5–15)
BUN: 108 mg/dL — ABNORMAL HIGH (ref 6–20)
CO2: 16 mmol/L — ABNORMAL LOW (ref 22–32)
Calcium: 8.4 mg/dL — ABNORMAL LOW (ref 8.9–10.3)
Chloride: 106 mmol/L (ref 98–111)
Creatinine, Ser: 22.51 mg/dL — ABNORMAL HIGH (ref 0.61–1.24)
GFR, Estimated: 2 mL/min — ABNORMAL LOW (ref >60)
Glucose, Bld: 110 mg/dL — ABNORMAL HIGH (ref 70–99)
Potassium: 5.6 mmol/L — ABNORMAL HIGH (ref 3.5–5.1)
Sodium: 142 mmol/L (ref 135–145)

## 2021-05-28 LAB — BLOOD GAS, ARTERIAL
Acid-base deficit: 9.8 mmol/L — ABNORMAL HIGH (ref 0.0–2.0)
Bicarbonate: 15.7 mmol/L — ABNORMAL LOW (ref 20.0–28.0)
FIO2: 32
O2 Saturation: 92.9 %
Patient temperature: 37
pCO2 arterial: 32 mmHg (ref 32.0–48.0)
pH, Arterial: 7.3 — ABNORMAL LOW (ref 7.350–7.450)
pO2, Arterial: 74 mmHg — ABNORMAL LOW (ref 83.0–108.0)

## 2021-05-28 LAB — IRON AND TIBC
Iron: 123 ug/dL (ref 45–182)
Saturation Ratios: 37 % (ref 17.9–39.5)
TIBC: 337 ug/dL (ref 250–450)
UIBC: 214 ug/dL

## 2021-05-28 LAB — BRAIN NATRIURETIC PEPTIDE: B Natriuretic Peptide: >4500 pg/mL — ABNORMAL HIGH (ref 0.0–100.0)

## 2021-05-28 LAB — RETICULOCYTES
Immature Retic Fract: 22.8 % — ABNORMAL HIGH (ref 2.3–15.9)
RBC.: 2.24 MIL/uL — ABNORMAL LOW (ref 4.22–5.81)
Retic Count, Absolute: 61.2 10*3/uL (ref 19.0–186.0)
Retic Ct Pct: 2.7 % (ref 0.4–3.1)

## 2021-05-28 LAB — FOLATE: Folate: 13.3 ng/mL (ref >5.9)

## 2021-05-28 LAB — RESP PANEL BY RT-PCR (FLU A&B, COVID) ARPGX2
Influenza A by PCR: NEGATIVE
Influenza B by PCR: NEGATIVE
SARS Coronavirus 2 by RT PCR: NEGATIVE

## 2021-05-28 LAB — TROPONIN I (HIGH SENSITIVITY)
Troponin I (High Sensitivity): 108 ng/L (ref <18)
Troponin I (High Sensitivity): 108 ng/L (ref <18)

## 2021-05-28 LAB — FERRITIN: Ferritin: 930 ng/mL — ABNORMAL HIGH (ref 24–336)

## 2021-05-28 IMAGING — CR DG CHEST 2V
1 series · 3 of 3 positions shown · non-contrast
Comparison: [DATE]

CLINICAL DATA: Shortness of breath.

EXAM:
CHEST - 2 VIEW

[Series 1: dg chest 2 view · 0.14mm/px · 3 of 3 slices shown]
[im 1/3]
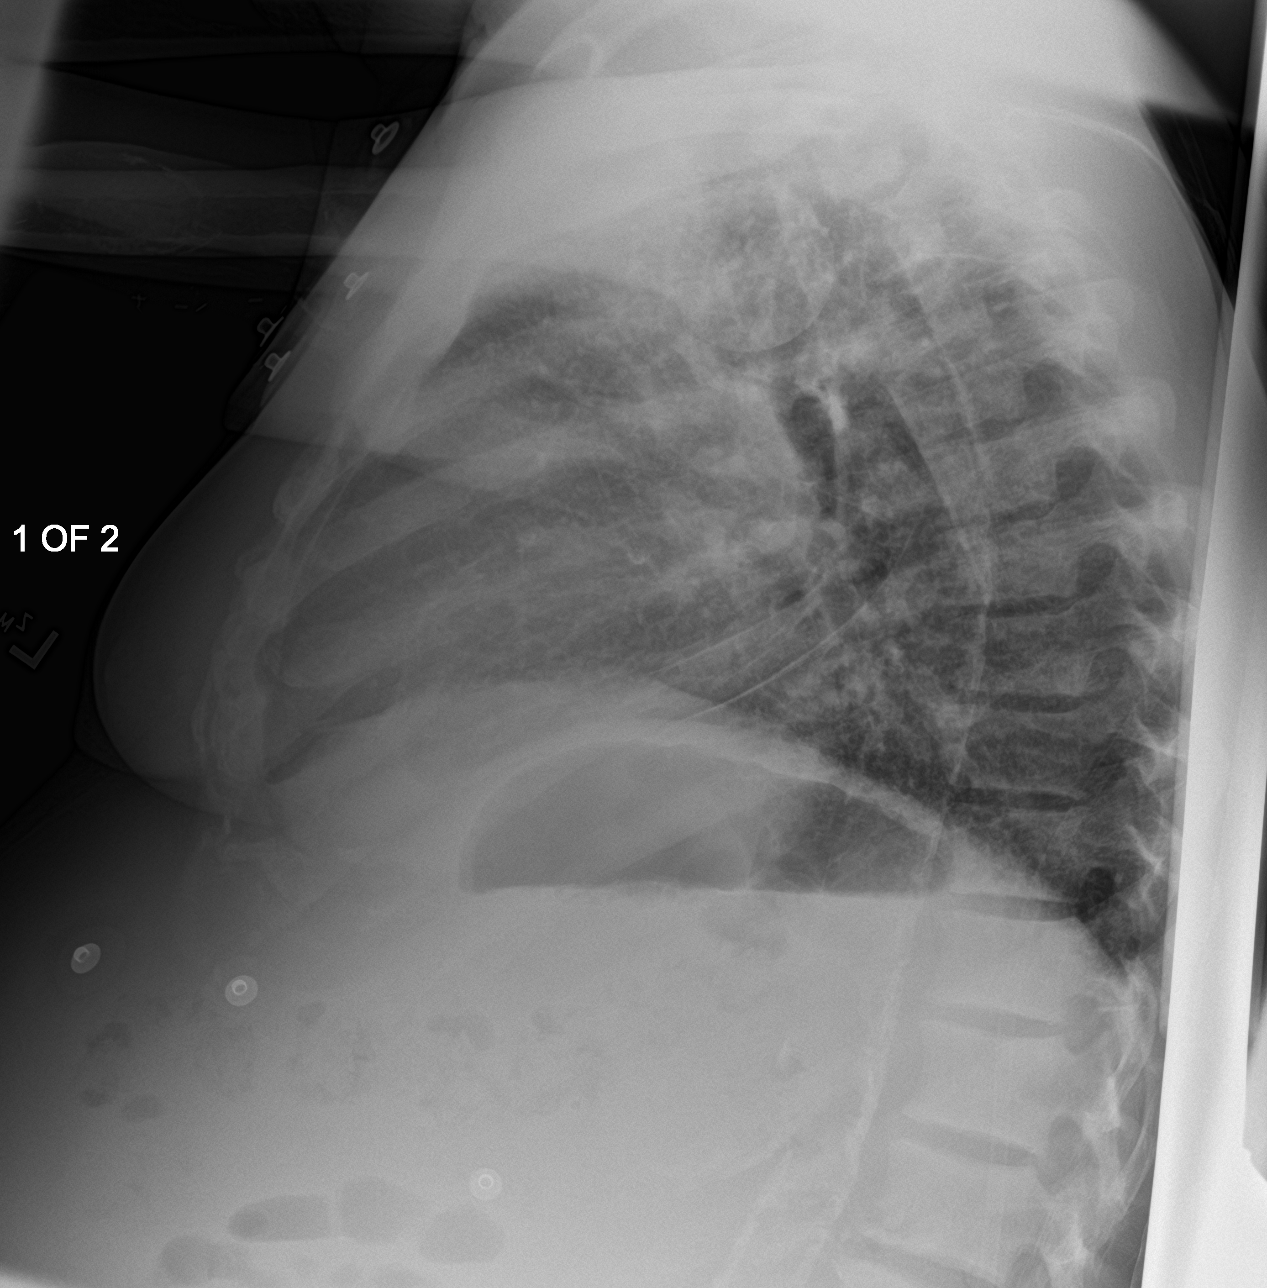
[im 2/3]
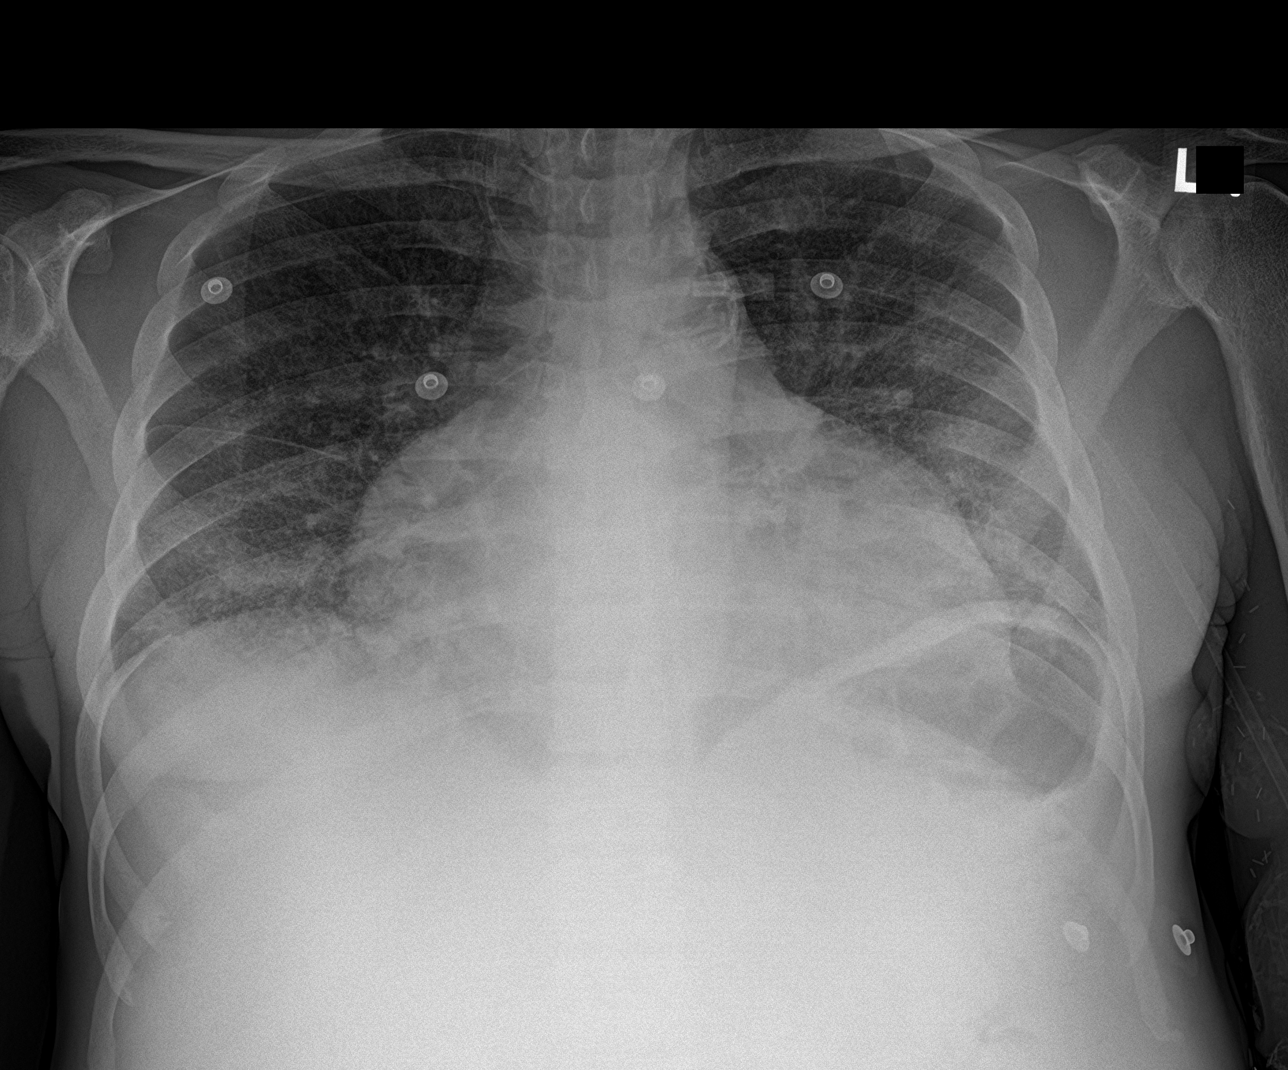
[im 3/3]
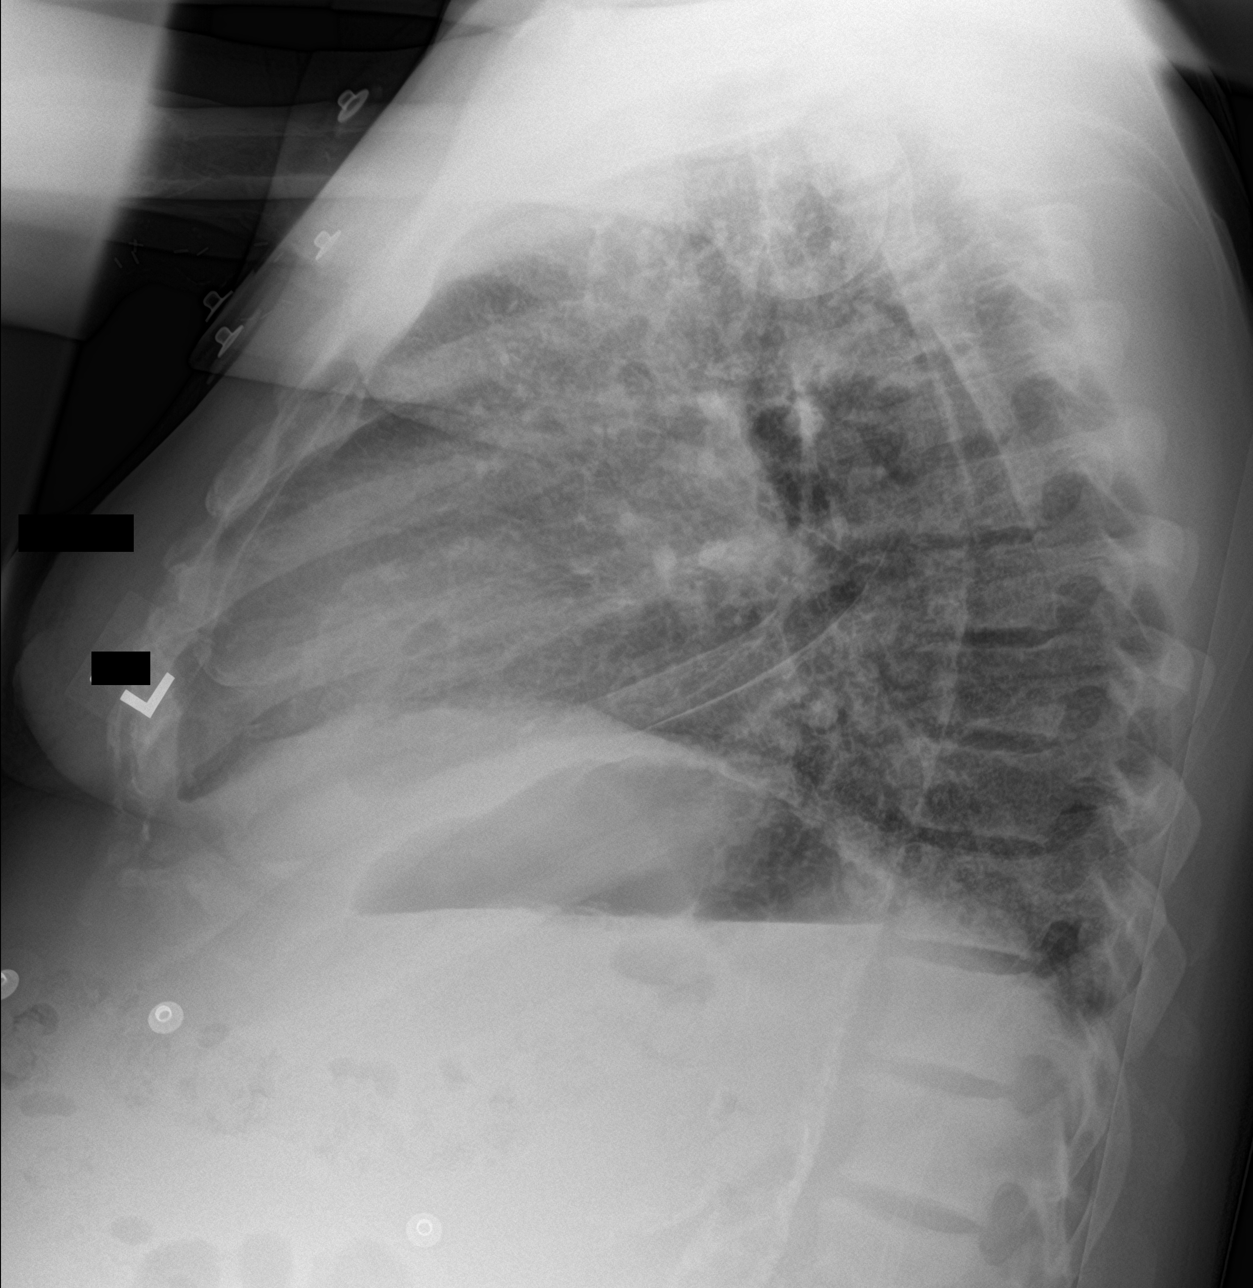

[3 of 3 positions shown; findings below may reference images not displayed]

FINDINGS: Stable cardiac enlargement. Diffuse increase interstitial markings
throughout both lungs compatible with interstitial edema. No pleural
effusion identified. No airspace consolidation.
IMPRESSION: Cardiac enlargement with diffuse increase interstitial markings
compatible with interstitial edema.

## 2021-05-28 MED ORDER — SODIUM CHLORIDE 0.9 % IV SOLN
10.0000 mL/h | Freq: Once | INTRAVENOUS | Status: AC
  Administered 2021-05-29: 10 mL/h via INTRAVENOUS

## 2021-05-28 MED ORDER — FUROSEMIDE 10 MG/ML IJ SOLN
120.0000 mg | Freq: Once | INTRAVENOUS | Status: AC
  Administered 2021-05-28: 120 mg via INTRAVENOUS
  Filled 2021-05-28: qty 12

## 2021-05-28 MED ORDER — PATIROMER SORBITEX CALCIUM 8.4 G PO PACK
16.8000 g | PACK | Freq: Once | ORAL | Status: AC
  Administered 2021-05-28: 16.8 g via ORAL
  Filled 2021-05-28 (×2): qty 2

## 2021-05-28 MED ORDER — IPRATROPIUM-ALBUTEROL 0.5-2.5 (3) MG/3ML IN SOLN
6.0000 mL | Freq: Once | RESPIRATORY_TRACT | Status: AC
  Administered 2021-05-28: 6 mL via RESPIRATORY_TRACT
  Filled 2021-05-28: qty 6

## 2021-05-28 MED ORDER — ALBUTEROL SULFATE (2.5 MG/3ML) 0.083% IN NEBU: 2.5000 mg | INHALATION_SOLUTION | RESPIRATORY_TRACT | Status: DC | PRN

## 2021-05-28 MED ORDER — METHYLPREDNISOLONE SODIUM SUCC 125 MG IJ SOLR
125.0000 mg | Freq: Once | INTRAMUSCULAR | Status: AC
  Administered 2021-05-28: 125 mg via INTRAVENOUS
  Filled 2021-05-28: qty 2

## 2021-05-28 NOTE — ED Notes (Signed)
Pt's second type and screen was sent off at this time

## 2021-05-28 NOTE — ED Notes (Signed)
Pt ambulated to the bathroom at this time.

## 2021-05-28 NOTE — ED Notes (Signed)
Lab called and stated that the type and screen sample had clotted and that a phlebotomist would be down to collect it shortly.

## 2021-05-28 NOTE — ED Provider Notes (Signed)
[facility] Emergency Department Provider Note ____________________________________________   Event Date/Time   First MD Initiated Contact with Patient 05/28/21 1920     (approximate)  I have reviewed the triage vital signs and the nursing notes.  HISTORY  Chief Complaint Shortness of Breath   HPI Jordan [name] is a 55 y.o. malewho presents to the ED for evaluation of shortness of breath.   Chart review indicates ESRD and COPD pt. chronic respiratory failure on 2 L home O2.  Patient presents to the ED for evaluation of shortness of breath "for a while."  He is a poor time describing the timeframe of his illness really much of anything.  He reports he was dialyzed "maybe once or twice" last week.  At 1 point he tells me his last dialysis session was 2 weeks ago, at other times it was this past Friday.  He reports continued cigarette smoking and baseline smoker's cough without increased sputum production, fevers, chest pain or syncopal episodes.  Reports increasing abdominal distention, lower extremity swelling and orthopnea over the past "little while."  Denies melena, hematochezia or stigmata of blood loss.  Past Medical History:  Diagnosis Date   Anemia    Aortic atherosclerosis ([place]) 11/12/2019   CKD (chronic kidney disease)    Stage 5  Dialysis - M/W/F in [place], [place]   Dyspnea    tx with inhaler when sick   ED (erectile dysfunction)    Emphysema of lung ([place]) 11/12/2019   ETOH abuse    History of blood transfusion    Hypertension    Wears dentures     Patient Active Problem List   Diagnosis Date Noted   Symptomatic anemia 05/28/2021   Acute on chronic diastolic CHF (congestive heart failure) ([place]) 05/28/2021   Pulmonary edema 04/29/2021   Pruritus 04/29/2021   COVID-19 virus infection 04/29/2021   Thrombocytopenia ([place]) 04/08/2021   COPD (chronic obstructive pulmonary disease) ([place]) 04/08/2021   CHF (congestive heart failure) ([place])  03/25/2021   PAF (paroxysmal atrial fibrillation) ([place]) 03/20/2021   Chronic diastolic CHF (congestive heart failure) ([place]) 03/20/2021   Alcohol abuse 03/20/2021   Vision loss of left eye 03/20/2021   SOB (shortness of breath) 03/20/2021   Shortness of breath 03/10/2021   Abscess of buttock 03/02/2021   Fluid overload 03/02/2021   Multifocal pneumonia 01/13/2021   Current mild episode of major depressive disorder without prior episode ([place]) 04/09/2020   Atrial fibrillation with RVR ([place]) 04/04/2020   Non-compliance with renal dialysis ([place]) 03/21/2020   Acute CHF (congestive heart failure) ([place]) 03/12/2020   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 ([place]) 02/03/2020   Acute on chronic combined systolic and diastolic CHF (congestive heart failure) ([place]) 02/03/2020   Acute respiratory failure with hypoxia ([place]) 11/12/2019   Aortic atherosclerosis ([place]) 11/12/2019   Emphysema of lung ([place]) 11/12/2019   Acute on chronic congestive heart failure ([place]) 10/22/2019   Volume overload 10/05/2019   Elevated troponin 05/17/2018   Anemia in ESRD (end-stage renal disease) ([place]) 05/17/2018   ESRD on dialysis ([place]) 05/26/2017   Essential hypertension 05/26/2017   Moderate protein-calorie malnutrition ([place]) 08/13/2016   Secondary hyperparathyroidism of renal origin ([place]) 08/13/2016   Tobacco abuse 08/04/2016    Past Surgical History:  Procedure Laterality Date   [place] Left 08/07/2016   Procedure: LEFT BASILIC VEIN TRANSPOSITION;  Surgeon: Angelia Mould, MD;  Location: [place];  Service: Vascular;  Laterality: Left;   CLOSED REDUCTION NASAL  FRACTURE N/A 08/11/2019   Procedure: CLOSED REDUCTION NASAL FRACTURE WITH STABILIZATION;  Surgeon: Irene Limbo, MD;  Location: [place];  Service: Plastics;  Laterality: N/A;   COLONOSCOPY N/A 08/12/2016   Procedure: COLONOSCOPY;  Surgeon: Otis Brace, MD;  Location: [place];  Service: Gastroenterology;   Laterality: N/A;   ESOPHAGOGASTRODUODENOSCOPY N/A 08/12/2016   Procedure: ESOPHAGOGASTRODUODENOSCOPY (EGD);  Surgeon: Otis Brace, MD;  Location: [facility];  Service: Gastroenterology;  Laterality: N/A;   INSERTION OF DIALYSIS CATHETER N/A 08/07/2016   Procedure: INSERTION OF TUNNELED DIALYSIS CATHETER;  Surgeon: Angelia Mould, MD;  Location: [facility];  Service: Vascular;  Laterality: N/A;   LIGATION OF ARTERIOVENOUS  FISTULA Left 09/12/2016   Procedure: BANDING OF LEFT  ARTERIOVENOUS  FISTULA;  Surgeon: Angelia Mould, MD;  Location: [place];  Service: Vascular;  Laterality: Left;   LOWER EXTREMITY INTERVENTION Right 12/02/2018   Procedure: LOWER EXTREMITY INTERVENTION;  Surgeon: Algernon Huxley, MD;  Location: [facility];  Service: Cardiovascular;  Laterality: Right;    Prior to Admission medications   Medication Sig Start Date End Date Taking? Authorizing Provider  albuterol (VENTOLIN HFA) 108 (90 Base) MCG/ACT inhaler Inhale 2 puffs into the lungs every 4 (four) hours as needed for wheezing or shortness of breath. 05/04/21   Aline August, MD  calcium acetate (PHOSLO) 667 MG capsule Take 2 capsules (1,334 mg total) by mouth 3 (three) times daily with meals. 05/23/21   Naaman Plummer, MD  cinacalcet (SENSIPAR) 30 MG tablet Take 1 tablet (30 mg total) by mouth daily with breakfast. 05/04/21   Aline August, MD  epoetin alfa (EPOGEN) 4000 UNIT/ML injection Inject 1 mL (4,000 Units total) into the vein every Monday, Wednesday, and Friday with hemodialysis. 06/22/20   Enzo Bi, MD  guaiFENesin (MUCINEX) 600 MG 12 hr tablet Take 1 tablet (600 mg total) by mouth 2 (two) times daily. 05/04/21   Aline August, MD  hydrALAZINE (APRESOLINE) 50 MG tablet Take 50 mg by mouth 3 (three) times daily.    [provider]  hydrOXYzine (ATARAX/VISTARIL) 25 MG tablet Take 1 tablet (25 mg total) by mouth 3 (three) times daily. 05/04/21   Aline August, MD  Lidocaine 3 % CREA Apply 1  application topically 2 (two) times daily as needed (pain). 02/26/21   Cuthriell, Charline Bills, PA-C  Lidocaine 3 % CREA Apply 1 application topically 3 (three) times daily as needed (itching). 05/23/21   Naaman Plummer, MD  thiamine 100 MG tablet Take 1 tablet (100 mg total) by mouth daily. 03/11/21   Lorella Nimrod, MD  triamcinolone cream (KENALOG) 0.1 % Apply 1 application topically 2 (two) times daily. 05/23/21   Naaman Plummer, MD    Allergies Lisinopril  Family History  Problem Relation Age of Onset   Diabetes Mother    Kidney failure Mother    Healthy Father    Kidney failure Brother    Healthy Sister    Kidney disease Daughter    Post-traumatic stress disorder Neg Hx    Bladder Cancer Neg Hx    Kidney cancer Neg Hx     Social History Social History   Tobacco Use   Smoking status: Every Day    Packs/day: 0.50    Years: 40.00    Pack years: 20.00    Types: Cigarettes   Smokeless tobacco: Never  Vaping Use   Vaping Use: Never used  Substance Use Topics   Alcohol use: Yes    Alcohol/week: 21.0  standard drinks    Types: 21 Cans of beer per week    Comment: last drink 6 months ago   Drug use: No    Review of Systems  Constitutional: No fever/chills Eyes: No visual changes. ENT: No sore throat. Cardiovascular: Denies chest pain. Respiratory: Positive for orthopnea and shortness of breath. Gastrointestinal: No abdominal pain.  No nausea, no vomiting.  No diarrhea.  No constipation. Genitourinary: Negative for dysuria. Musculoskeletal: Negative for back pain. Skin: Negative for rash. Neurological: Negative for headaches, focal weakness or numbness.  ____________________________________________   PHYSICAL EXAM:  VITAL SIGNS: Vitals:   05/28/21 2030 05/28/21 2100  BP: (!) 145/94 (!) 148/98  Pulse: 92   Resp:    Temp:    SpO2: 98%       Constitutional: Alert and oriented.  Laying on his left side and appears uncomfortable.  Frequent coughing. Eyes:  Conjunctivae are normal. PERRL. EOMI. Head: Atraumatic. Nose: No congestion/rhinnorhea. Mouth/Throat: Mucous membranes are moist.  Oropharynx non-erythematous. Neck: No stridor. No cervical spine tenderness to palpation. Cardiovascular: Normal rate, regular rhythm. Grossly normal heart sounds.  Good peripheral circulation. Respiratory: Tachypneic to the low 20s without retractions or distress.  Diffuse expiratory wheezes and slight decrease in air movement throughout.  Bibasilar crackles are present. Gastrointestinal: Soft , nondistended, nontender to palpation. No CVA tenderness. Musculoskeletal:  No joint effusions.  AV fistula to left upper extremity with palpable thrill and distally neurovascularly intact. Pitting edema to bilateral lower extremities up to the knees without overlying signs of trauma Neurologic:  No gross focal neurologic deficits are appreciated.  Skin:  Skin is warm, dry and intact. No rash noted. Psychiatric: Mood and affect are normal. Speech and behavior are normal.  ____________________________________________   LABS (all labs ordered are listed, but only abnormal results are displayed)  Labs Reviewed  BASIC METABOLIC PANEL - Abnormal; Notable for the following components:      Result Value   Potassium 5.6 (*)    CO2 16 (*)    Glucose, Bld 110 (*)    BUN 108 (*)    Creatinine, Ser 22.51 (*)    Calcium 8.4 (*)    GFR, Estimated 2 (*)    Anion gap 20 (*)    All other components within normal limits  CBC - Abnormal; Notable for the following components:   RBC 2.13 (*)    Hemoglobin 6.9 (*)    HCT 22.2 (*)    MCV 104.2 (*)    RDW 15.9 (*)    All other components within normal limits  BRAIN NATRIURETIC PEPTIDE - Abnormal; Notable for the following components:   B Natriuretic Peptide >4,500.0 (*)    All other components within normal limits  BLOOD GAS, ARTERIAL - Abnormal; Notable for the following components:   pH, Arterial 7.30 (*)    pO2, Arterial 74  (*)    Bicarbonate 15.7 (*)    Acid-base deficit 9.8 (*)    All other components within normal limits  TROPONIN I (HIGH SENSITIVITY) - Abnormal; Notable for the following components:   Troponin I (High Sensitivity) 108 (*)    All other components within normal limits  TROPONIN I (HIGH SENSITIVITY) - Abnormal; Notable for the following components:   Troponin I (High Sensitivity) 108 (*)    All other components within normal limits  RESP PANEL BY RT-PCR (FLU A&B, COVID) ARPGX2  VITAMIN B12  FOLATE  IRON AND TIBC  FERRITIN  RETICULOCYTES  TYPE AND SCREEN  TYPE AND  SCREEN  PREPARE RBC (CROSSMATCH)  TYPE AND SCREEN   ____________________________________________  12 Lead EKG  Sinus rhythm with a rate of 102 bpm.  Normal axis and intervals lateral and inferior T wave inversions without STEMI. Similar to previous from 1 week ago ____________________________________________  RADIOLOGY  ED MD interpretation: 2 view CXR reviewed by me with cardiomegaly and pulm vascular congestion.  Official radiology report(s): DG Chest 2 View  Result Date: 05/28/2021 CLINICAL DATA:  Shortness of breath. EXAM: CHEST - 2 VIEW COMPARISON:  05/22/2021 FINDINGS: Stable cardiac enlargement. Diffuse increase interstitial markings throughout both lungs compatible with interstitial edema. No pleural effusion identified. No airspace consolidation. IMPRESSION: Cardiac enlargement with diffuse increase interstitial markings compatible with interstitial edema. Electronically Signed   By: Kerby Moors M.D.   On: 05/28/2021 19:00    ____________________________________________   PROCEDURES and INTERVENTIONS  Procedure(s) performed (including Critical Care):  .1-3 Lead EKG Interpretation  Date/Time: 05/28/2021 9:26 PM Performed by: Vladimir Crofts, MD Authorized by: Vladimir Crofts, MD     Interpretation: normal     ECG rate:  90   ECG rate assessment: normal     Rhythm: sinus rhythm     Ectopy: none      Conduction: normal   .Critical Care  Date/Time: 05/28/2021 9:26 PM Performed by: Vladimir Crofts, MD Authorized by: Vladimir Crofts, MD   Critical care provider statement:    Critical care time (minutes):  45   Critical care was necessary to treat or prevent imminent or life-threatening deterioration of the following conditions:  Circulatory failure and respiratory failure   Critical care was time spent personally by me on the following activities:  Discussions with consultants, evaluation of patient's response to treatment, examination of patient, ordering and performing treatments and interventions, ordering and review of laboratory studies, ordering and review of radiographic studies, pulse oximetry, re-evaluation of patient's condition, obtaining history from patient or surrogate and review of old charts  Medications  furosemide (LASIX) 120 mg in dextrose 5 % 50 mL IVPB (120 mg Intravenous New Bag/Given 05/28/21 2053)  0.9 %  sodium chloride infusion (has no administration in time range)  ipratropium-albuterol (DUONEB) 0.5-2.5 (3) MG/3ML nebulizer solution 6 mL (6 mLs Nebulization Given 05/28/21 1946)  methylPREDNISolone sodium succinate (SOLU-MEDROL) 125 mg/2 mL injection 125 mg (125 mg Intravenous Given 05/28/21 1947)  patiromer Daryll Drown) packet 16.8 g (16.8 g Oral Given 05/28/21 2048)    ____________________________________________   MDM / ED COURSE   Noncompliant dialysis patient presents to the ED short of breath with hypoxia and evidence of COPD exacerbation with volume overload.  Hypoxic on his home 2 L O2 requiring 4 L, otherwise hemodynamically stable.  Exam with stigmata of volume overload and COPD exacerbation.  No distress or current indications for BiPAP.  Blood work with dropping hemoglobin, now below 7 requiring transfusion of packed red cells.  Further with evidence of poorly compensated ESRD with BUN greater than 100, creatinine of 11 and mild hyperkalemia.  I discussed the case with  nephrology, who plans to dialyze likely first thing in the morning.  We will provide appropriate temporizing measures and admit to medicine.  Clinical Course as of 05/28/21 2126  Tue May 28, 2021  1949 I discuss with Dr. Holley Raring.  Has some rather critical patients ahead of him at this point, may not get him dialyzed until morning.  He recommends very temporizing measures, that I order. [DS]  2124 Discussed with hospitalist, who agrees to admit [DS]  Clinical Course User Index [DS] Vladimir Crofts, MD    ____________________________________________   FINAL CLINICAL IMPRESSION(S) / ED DIAGNOSES  Final diagnoses:  COPD exacerbation ([place])  Hypoxia  Shortness of breath  Other hypervolemia  ESRD (end stage renal disease) [facility])     ED Discharge Orders     None        Joie Reamer   Note:  This document was prepared using Dragon voice recognition software and may include unintentional dictation errors.    Vladimir Crofts, MD 05/28/21 2130

## 2021-05-28 NOTE — ED Triage Notes (Signed)
Pt brought in by [facility] with c/o shortness of breath with pain all over. He has missed several of his dialysis appointments. Pt has cigarettes and lighter in his hand. Onset of this episode was 2 days ago but is chronic for him.

## 2021-05-28 NOTE — ED Notes (Signed)
Lab called and stated they are still running antibody tests for the cross match.

## 2021-05-28 NOTE — H&P (Signed)
Jordan [name] [id number] DOB: [birth year] DOA: 05/28/2021     PCP: Merryl Hacker, No   Outpatient Specialists:   NEphrology:   Patient arrived to ER on 05/28/21 at 1728 Referred by Attending Vladimir Crofts, MD   Patient coming from: home Lives alone,      Chief Complaint:   Chief Complaint  Patient presents with   Shortness of Breath    HPI: Jordan [name] is a 55 y.o. male with medical history significant of 55 y.o. male with medical history significant of ESRD on HD MVF anemia EtOH abuse, HTN, Hep B, anemia, COPD, paroxysmal atrial fibrillation, thrombocytopenia    Presented with  SOB did not go to his HD Patient has recurrent admissions secondary to missing his hemodialysis usually because he is not feeling well. In the beginning of August he was admitted with HD noncompliance tested positive for COVID and was treated with monoclonal antibody. He did not require transfusion during his last admission. continues to smoke    May have been dialized last week    Recent COVID POSITIVE   Has  been vaccinated against COVID and boosted   Initial COVID TEST  NEGATIVE   Lab Results  Component Value Date   SARSCOV2NAA NEGATIVE 05/28/2021   [place] (A) 04/29/2021   [place] NEGATIVE 04/08/2021   [place] NEGATIVE 03/28/2021     Regarding pertinent Chronic problems:     Hyperlipidemia -  not on statins Lipid Panel     Component Value Date/Time   CHOL 145 05/17/2018 0359   TRIG 105 04/29/2021 2159   HDL 34 (L) 05/17/2018 0359   CHOLHDL 4.3 05/17/2018 0359   VLDL 13 05/17/2018 0359   LDLCALC 98 05/17/2018 0359     HTN on hydralazine   chronic CHF diastolic/systolic/ combined - last echoJune 2022 EF [id number] grade 2 diastolic CHF     COPD - not  followed by pulmonology    on baseline oxygen  2L,        A. Fib -  - CHA2DS2 vas score  2   OSA states supposed to be on CPAP but does no t use  End-stage renal disease on hemodialysis Lab Results   Component Value Date   CREATININE 22.51 (H) 05/28/2021   CREATININE 19.29 (H) 05/22/2021   CREATININE 14.96 (H) 05/10/2021       Liver disease history of hep B   Chronic anemia - baseline hg Hemoglobin & Hematocrit  Recent Labs    05/10/21 1301 05/22/21 2303 05/28/21 1747  HGB 7.2* 7.6* 6.9*   Needing recurrent transfusions  While in ER:  Pt was noted in fluid overload Nephrolyg was consult will see in AM  Given elevated K     ED Triage Vitals  Enc Vitals Group     BP 05/28/21 1739 (!) 153/100     Pulse Rate 05/28/21 1739 96     Resp 05/28/21 1739 20     Temp 05/28/21 1739 98.4 F (36.9 C)     Temp Source 05/28/21 1739 Oral     SpO2 05/28/21 1739 (!) 88 %     Weight 05/28/21 1741 160 lb 15 oz (73 kg)     Height 05/28/21 1741 '5\' 4"'$  (1.626 m)     Head Circumference --      Peak Flow --      Pain Score 05/28/21 1741 8     Pain Loc --      Pain Edu? --  Excl. in [place]? --   [id number]     _________________________________________ Significant initial  Findings: Abnormal Labs Reviewed  BASIC METABOLIC PANEL - Abnormal; Notable for the following components:      Result Value   Potassium 5.6 (*)    CO2 16 (*)    Glucose, Bld 110 (*)    BUN 108 (*)    Creatinine, Ser 22.51 (*)    Calcium 8.4 (*)    GFR, Estimated 2 (*)    Anion gap 20 (*)    All other components within normal limits  CBC - Abnormal; Notable for the following components:   RBC 2.13 (*)    Hemoglobin 6.9 (*)    HCT 22.2 (*)    MCV 104.2 (*)    RDW 15.9 (*)    All other components within normal limits  BRAIN NATRIURETIC PEPTIDE - Abnormal; Notable for the following components:   B Natriuretic Peptide >4,500.0 (*)    All other components within normal limits  BLOOD GAS, ARTERIAL - Abnormal; Notable for the following components:   pH, Arterial 7.30 (*)    pO2, Arterial 74 (*)    Bicarbonate 15.7 (*)    Acid-base deficit 9.8 (*)    All other components within normal limits  TROPONIN I (HIGH  SENSITIVITY) - Abnormal; Notable for the following components:   Troponin I (High Sensitivity) 108 (*)    All other components within normal limits  TROPONIN I (HIGH SENSITIVITY) - Abnormal; Notable for the following components:   Troponin I (High Sensitivity) 108 (*)    All other components within normal limits   ____________________________________________ Ordered    CXR - interstitial edema  __________________ Troponin 100 ECG: Ordered Personally reviewed by me showing: HR : 102 Rhythm:    Sinus tachycardia   no evidence of ischemic changes QTC 411  The recent clinical data is shown below. Vitals:   05/28/21 1739 05/28/21 1741 05/28/21 2000 05/28/21 2030  BP: (!) 153/100   (!) 145/94  Pulse: 96  96 92  Resp: 20  17   Temp: 98.4 F (36.9 C)     TempSrc: Oral     SpO2: (!) 88%  100% 98%  Weight:  73 kg    Height:  '5\' 4"'$  (1.626 m)       WBC     Component Value Date/Time   WBC 9.6 05/28/2021 1747   LYMPHSABS 0.6 (L) 05/10/2021 1301   MONOABS 0.5 05/10/2021 1301   EOSABS 0.1 05/10/2021 1301   BASOSABS 0.0 05/10/2021 1301     Lactic Acid, Venous    Component Value Date/Time   LATICACIDVEN 2.2 (HH) 04/30/2021 0512    Results for orders placed or performed during the hospital encounter of 05/28/21  Resp Panel by RT-PCR (Flu A&B, Covid) Nasopharyngeal Swab     Status: None   Collection Time: 05/28/21  7:41 PM   Specimen: Nasopharyngeal Swab; Nasopharyngeal(NP) swabs in vial transport medium  Result Value Ref Range Status   SARS Coronavirus 2 by RT PCR NEGATIVE NEGATIVE Final         Influenza A by PCR NEGATIVE NEGATIVE Final   Influenza B by PCR NEGATIVE NEGATIVE Final          _______________________________________________________ ER Provider Called:   Nephrology   Dr. Holley Raring They Recommend admit to medicine   Will see in AM    _______________________________________________ Hospitalist was called for admission for fluid overlaod hyper kalemia in the  setting of hemodialysis noncompliance  The following Work up has been ordered so far:  Orders Placed This Encounter  Procedures   Resp Panel by RT-PCR (Flu A&B, Covid) Nasopharyngeal Swab   DG Chest 2 View   Basic metabolic panel   CBC   Brain natriuretic peptide   Blood gas, arterial   Document Height and Actual Weight   Consult to hospitalist   ED EKG   Type and screen [place]   Type and screen   Prepare RBC (crossmatch)   Type and screen   Saline lock IV      Following Medications were ordered in ER: Medications  furosemide (LASIX) 120 mg in dextrose 5 % 50 mL IVPB (120 mg Intravenous New Bag/Given 05/28/21 2053)  0.9 %  sodium chloride infusion (has no administration in time range)  ipratropium-albuterol (DUONEB) 0.5-2.5 (3) MG/3ML nebulizer solution 6 mL (6 mLs Nebulization Given 05/28/21 1946)  methylPREDNISolone sodium succinate (SOLU-MEDROL) 125 mg/2 mL injection 125 mg (125 mg Intravenous Given 05/28/21 1947)  patiromer Daryll Drown) packet 16.8 g (16.8 g Oral Given 05/28/21 2048)        Consult Orders  (From admission, onward)           Start     Ordered   05/28/21 2047  Consult to hospitalist  Once       Provider:  (Not yet assigned)  Question Answer Comment  Place call to: hospitalist   Reason for Consult Admit   Diagnosis/Clinical Info for Consult:  Room 4Charlsie Quest [id number]     05/28/21 2046              OTHER Significant initial  Findings:  labs showing:    Recent Labs  Lab 05/22/21 2303 05/28/21 1747  NA 140 142  K 4.9 5.6*  CO2 18* 16*  GLUCOSE 83 110*  BUN 109* 108*  CREATININE 19.29* 22.51*  CALCIUM 7.8* 8.4*    Cr     Up from baseline see below Lab Results  Component Value Date   CREATININE 22.51 (H) 05/28/2021   CREATININE 19.29 (H) 05/22/2021   CREATININE 14.96 (H) 05/10/2021    No results for input(s): AST, ALT, ALKPHOS, BILITOT, PROT, ALBUMIN in the last 168 hours. Lab Results  Component Value  Date   CALCIUM 8.4 (L) 05/28/2021   PHOS 7.8 (H) 05/01/2021    Plt: Lab Results  Component Value Date   PLT 150 05/28/2021       COVID-19 Labs  No results for input(s): DDIMER, FERRITIN, LDH, CRP in the last 72 hours.  Lab Results  Component Value Date   SARSCOV2NAA NEGATIVE 05/28/2021   SARSCOV2NAA POSITIVE (A) 04/29/2021   SARSCOV2NAA NEGATIVE 04/08/2021   [place] NEGATIVE 03/28/2021    Venous  Blood Gas result:  pH 7.30pCO2  38  ABG    Component Value Date/Time   PHART 7.30 (L) 05/28/2021 2037   PCO2ART 32 05/28/2021 2037   PO2ART 74 (L) 05/28/2021 2037   HCO3 15.7 (L) 05/28/2021 2037   TCO2 35 (H) 04/07/2020 2054   ACIDBASEDEF 9.8 (H) 05/28/2021 2037   O2SAT 92.9 05/28/2021 2037       Recent Labs  Lab 05/22/21 2303 05/28/21 1747  WBC 7.8 9.6  HGB 7.6* 6.9*  HCT 23.8* 22.2*  MCV 102.1* 104.2*  PLT 149* 150    HG/HCT    Down  from baseline see below    Component Value Date/Time   HGB 6.9 (L) 05/28/2021 1747   HCT 22.2 (L)  05/28/2021 1747   MCV 104.2 (H) 05/28/2021 1747    Cardiac Panel (last 3 results) No results for input(s): CKTOTAL, CKMB, TROPONINI, RELINDX in the last 72 hours.   BNP (last 3 results) Recent Labs    05/10/21 1301 05/22/21 2308 05/28/21 1747  BNP 3,463.3* >4,500.0* >4,500.0*         Cultures:    Component Value Date/Time   SDES BLOOD RIGHT HAND 04/29/2021 2248   SPECREQUEST  04/29/2021 2248    BOTTLES DRAWN AEROBIC AND ANAEROBIC Blood Culture results may not be optimal due to an inadequate volume of blood received in culture bottles   CULT  04/29/2021 2248    NO GROWTH 5 DAYS Performed at [facility], [place] [address] [place], [place] [address]    REPTSTATUS 05/04/2021 FINAL 04/29/2021 2248     Radiological Exams on Admission: DG Chest 2 View  Result Date: 05/28/2021 CLINICAL DATA:  Shortness of breath. EXAM: CHEST - 2 VIEW COMPARISON:  05/22/2021 FINDINGS: Stable cardiac enlargement. Diffuse increase  interstitial markings throughout both lungs compatible with interstitial edema. No pleural effusion identified. No airspace consolidation. IMPRESSION: Cardiac enlargement with diffuse increase interstitial markings compatible with interstitial edema. Electronically Signed   By: Kerby Moors M.D.   On: 05/28/2021 19:00   _______________________________________________________________________________________________________ Latest  Blood pressure (!) 145/94, pulse 92, temperature 98.4 F (36.9 C), temperature source Oral, resp. rate 17, height '5\' 4"'$  (1.626 m), weight 73 kg, SpO2 98 %.   Review of Systems:    Pertinent positives include:   fatigue, URI symptoms congestion  Constitutional:  No weight loss, night sweats, Fevers, chills,weight loss  HEENT:  No headaches, Difficulty swallowing,Tooth/dental problems,Sore throat,  No sneezing, itching, ear ache, nasal congestion, post nasal drip,  Cardio-vascular:  No chest pain, Orthopnea, PND, anasarca, dizziness, palpitations.no Bilateral lower extremity swelling  GI:  No heartburn, indigestion, abdominal pain, nausea, vomiting, diarrhea, change in bowel habits, loss of appetite, melena, blood in stool, hematemesis Resp:  no shortness of breath at rest. No dyspnea on exertion, No excess mucus, no productive cough, No non-productive cough, No coughing up of blood.No change in color of mucus.No wheezing. Skin:  no rash or lesions. No jaundice GU:  no dysuria, change in color of urine, no urgency or frequency. No straining to urinate.  No flank pain.  Musculoskeletal:  No joint pain or no joint swelling. No decreased range of motion. No back pain.  Psych:  No change in mood or affect. No depression or anxiety. No memory loss.  Neuro: no localizing neurological complaints, no tingling, no weakness, no double vision, no gait abnormality, no slurred speech, no confusion  All systems reviewed and apart from [place] all are  negative _______________________________________________________________________________________________ Past Medical History:   Past Medical History:  Diagnosis Date   Anemia    Aortic atherosclerosis ([place]) 11/12/2019   CKD (chronic kidney disease)    Stage 5  Dialysis - M/W/F in [place], [place]   Dyspnea    tx with inhaler when sick   ED (erectile dysfunction)    Emphysema of lung ([place]) 11/12/2019   ETOH abuse    History of blood transfusion    Hypertension    Wears dentures        Past Surgical History:  Procedure Laterality Date   BASCILIC VEIN TRANSPOSITION Left 08/07/2016   Procedure: LEFT BASILIC VEIN TRANSPOSITION;  Surgeon: Angelia Mould, MD;  Location: [facility];  Service: Vascular;  Laterality: Left;   CLOSED REDUCTION NASAL  FRACTURE N/A 08/11/2019   Procedure: CLOSED REDUCTION NASAL FRACTURE WITH STABILIZATION;  Surgeon: Irene Limbo, MD;  Location: [facility];  Service: Plastics;  Laterality: N/A;   COLONOSCOPY N/A 08/12/2016   Procedure: COLONOSCOPY;  Surgeon: Otis Brace, MD;  Location: [facility];  Service: Gastroenterology;  Laterality: N/A;   ESOPHAGOGASTRODUODENOSCOPY N/A 08/12/2016   Procedure: ESOPHAGOGASTRODUODENOSCOPY (EGD);  Surgeon: Otis Brace, MD;  Location: [facility];  Service: Gastroenterology;  Laterality: N/A;   INSERTION OF DIALYSIS CATHETER N/A 08/07/2016   Procedure: INSERTION OF TUNNELED DIALYSIS CATHETER;  Surgeon: Angelia Mould, MD;  Location: [facility];  Service: Vascular;  Laterality: N/A;   LIGATION OF ARTERIOVENOUS  FISTULA Left 09/12/2016   Procedure: BANDING OF LEFT  ARTERIOVENOUS  FISTULA;  Surgeon: Angelia Mould, MD;  Location: [facility];  Service: Vascular;  Laterality: Left;   LOWER EXTREMITY INTERVENTION Right 12/02/2018   Procedure: LOWER EXTREMITY INTERVENTION;  Surgeon: Algernon Huxley, MD;  Location: [facility];  Service: Cardiovascular;  Laterality: Right;    Social History:  Ambulatory    independently       reports that he has been smoking cigarettes. He has a 20.00 pack-year smoking history. He has never used smokeless tobacco. He reports current alcohol use of about 21.0 standard drinks per week. He reports that he does not use drugs.     Family History:   Family History  Problem Relation Age of Onset   Diabetes Mother    Kidney failure Mother    Healthy Father    Kidney failure Brother    Healthy Sister    Kidney disease Daughter    Post-traumatic stress disorder Neg Hx    Bladder Cancer Neg Hx    Kidney cancer Neg Hx    ______________________________________________________________________________________________ Allergies: Allergies  Allergen Reactions   Lisinopril Swelling     Prior to Admission medications   Medication Sig Start Date End Date Taking? Authorizing Provider  albuterol (VENTOLIN HFA) 108 (90 Base) MCG/ACT inhaler Inhale 2 puffs into the lungs every 4 (four) hours as needed for wheezing or shortness of breath. 05/04/21   Aline August, MD  calcium acetate (PHOSLO) 667 MG capsule Take 2 capsules (1,334 mg total) by mouth 3 (three) times daily with meals. 05/23/21   Naaman Plummer, MD  cinacalcet (SENSIPAR) 30 MG tablet Take 1 tablet (30 mg total) by mouth daily with breakfast. 05/04/21   Aline August, MD  epoetin alfa (EPOGEN) 4000 UNIT/ML injection Inject 1 mL (4,000 Units total) into the vein every Monday, Wednesday, and Friday with hemodialysis. 06/22/20   Enzo Bi, MD  guaiFENesin (MUCINEX) 600 MG 12 hr tablet Take 1 tablet (600 mg total) by mouth 2 (two) times daily. 05/04/21   Aline August, MD  hydrALAZINE (APRESOLINE) 50 MG tablet Take 50 mg by mouth 3 (three) times daily.    [provider]  hydrOXYzine (ATARAX/VISTARIL) 25 MG tablet Take 1 tablet (25 mg total) by mouth 3 (three) times daily. 05/04/21   Aline August, MD  Lidocaine 3 % CREA Apply 1 application topically 2 (two) times daily as needed (pain). 02/26/21   Cuthriell,  Charline Bills, PA-C  Lidocaine 3 % CREA Apply 1 application topically 3 (three) times daily as needed (itching). 05/23/21   Naaman Plummer, MD  thiamine 100 MG tablet Take 1 tablet (100 mg total) by mouth daily. 03/11/21   Lorella Nimrod, MD  triamcinolone cream (KENALOG) 0.1 % Apply 1 application topically 2 (two) times daily. 05/23/21  Naaman Plummer, MD    ___________________________________________________________________________________________________ Physical Exam: Vitals with BMI 05/28/2021 05/28/2021 05/28/2021  Height - - '5\' 4"'$   Weight - - 160 lbs 15 oz  BMI - - 0000000  Systolic [id number] - -  Diastolic 94 - -  Pulse 92 96 -  Some encounter information is confidential and restricted. Go to Review Flowsheets activity to see all data.     1. General:  in No  Acute distress   Chronically ill -appearing 2. Psychological: Alert and  Oriented 3. Head/ENT:   Dry Mucous Membranes                          Head Non traumatic, neck supple                         Poor Dentition 4. SKIN:normal kin turgor,  Skin clean Dry and intact  multiple excoriations 5. Heart: Regular rate and rhythm no Murmur, no Rub or gallop 6. Lungs: no wheezes  some crackles   7. Abdomen: Soft,  non-tender, Non distended   obese  bowel sounds present 8. Lower extremities: no clubbing, cyanosis,  2+ edema 9. Neurologically Grossly intact, moving all 4 extremities equally   10. MSK: Normal range of motion    Chart has been reviewed  ______________________________________________________________________________________________  Assessment/Plan 55 y.o. male with medical history significant of 55 y.o. male with medical history significant of ESRD on HD MVF anemia EtOH abuse, HTN, Hep B, anemia, COPD, paroxysmal atrial fibrillation, thrombocytopenia  Admitted for symptomatic anemia  Present on Admission:  Symptomatic anemia transfuse 1 unit and follow CBC.  Transfuse as needed.  May need Lasix for further after IV  transfusion   Volume overload secondary to noncompliance with hemodialysis Plan to hemodialyzed in the morning.  Meanwhile was given Lasix    Pulmonary edema secondary to fluid overload see above   Secondary hyperparathyroidism of renal origin ([place]) continue home medication   Pruritus chronic stable   PAF (paroxysmal atrial fibrillation) (HCC) not on anticoagulation currently rate controlled   Essential hypertension resume hydralazine   Elevated troponin -chronic stable no chest pain   COPD (chronic obstructive pulmonary disease) (HCC) -albuterol as needed currently no wheezing.  Shortness of breath more secondary to fluid overload Given a dose of steroids in the emergency department Continue low-dose steroids and monitor after pulmonary edema has been improved   Acute on chronic diastolic CHF (congestive heart failure) ([place]) -in the setting of noncompliance with hemodialysis.  Was diuresed in ER.  Defer to nephrology.  Other fluid removal   Hyperkalemia -treated in the emergency department with  [facility] nephrology aware plan for hemodialysis in a.m.  Hx of ETOH abuse  -monitor for any signs of withdrawal Patient states currently in remission  Tobacco abuse - Spoke about importance of quitting   discussing options for treatment, prior attempts at quitting, and dangers of smoking  -At this point patient is   interested in quitting  - order nicotine patch   - nursing tobacco cessation protocol  Recent covid infection treated with MAB during his last admission currently doing well continue to monitor now tested negative for COVID current acute on chronic respiratory failure more secondary to fluid overload  Acute on chronic respiratory failure oxygen requirement was up to 4 L from baseline of 2.  Most likely secondary to fluid overload.  Oxygen titrated up by EMS  Other plan as per  orders.  DVT prophylaxis:  SCD       Code Status:    Code Status: Prior FULL CODE  as per  patient   I had personally discussed CODE STATUS with patient        Family Communication:   Family not at  Bedside    Disposition Plan:       To home once workup is complete and patient is stable   Following barriers for discharge:                            Electrolytes corrected                               Anemia corrected                            able to transition to PO antibiotics                             Will need to be able to tolerate PO                            Will likely need home health, home O2, set up                           Will need consultants to evaluate patient prior to discharge                       Would benefit from PT/OT eval prior to [place] called: nephrology  Dr. Holley Raring  Admission status:  ED Disposition     ED Disposition  [place]: [place] [[id number]]  Level of Care: Progressive Cardiac [[id number]]  Admit to Progressive based on following criteria: CARDIOVASCULAR & THORACIC of moderate stability with acute coronary syndrome symptoms/low risk myocardial infarction/hypertensive urgency/arrhythmias/heart failure potentially compromising stability and stable post cardiovascular intervention patients.  Admit to Progressive based on following criteria: MULTISYSTEM THREATS such as stable sepsis, metabolic/electrolyte imbalance with or without encephalopathy that is responding to early treatment.  Covid Evaluation: Asymptomatic Screening Protocol (No Symptoms)  Diagnosis: Symptomatic anemia [id number]  Admitting Physician: Toy Baker [[id number]]  Attending Physician: Toy Baker [[id number]]  Estimated length of stay: past midnight tomorrow  Certification:: I certify this patient will need inpatient services for at least 2 midnights              inpatient     I Expect 2 midnight stay secondary to severity of patient's current illness need for  inpatient interventions justified by the following:  hemodynamic instability despite optimal treatment ( hypoxia,  )  Severe lab/radiological/exam abnormalities including:   Hyper kalemia and extensive comorbidities including:  substance abuse     CHF   COPD/asthma ESRD     That are currently affecting medical management.   I expect  patient to be hospitalized for 2 midnights requiring inpatient medical care.  Patient is at high risk for adverse outcome (such as loss of life or disability) if  not treated.  Indication for inpatient stay as follows:    Need for HD New or worsening hypoxia  Need for iv diuretics    Level of care    progressive tele indefinitely please discontinue once patient no longer qualifies COVID-19 Labs    Lab Results  Component Value Date   [place] 05/28/2021     Precautions: admitted as  Covid Negative    PPE: Used by the provider:   N95   eye Goggles,  Gloves    Aylani Spurlock 05/28/2021, 10:38 PM    [facility]     after 2 AM please page floor coverage PA If 7AM-7PM, please contact the day team taking care of the patient using [email or web address]   Patient was evaluated in the context of the global COVID-19 pandemic, which necessitated consideration that the patient might be at risk for infection with the SARS-CoV-2 virus that causes COVID-19. Institutional protocols and algorithms that pertain to the evaluation of patients at risk for COVID-19 are in a state of rapid change based on information released by regulatory bodies including the CDC and federal and state organizations. These policies and algorithms were followed during the patient's care.

## 2021-05-28 NOTE — ED Notes (Signed)
Pt provided with gram crackers and diet sprite.

## 2021-05-29 DIAGNOSIS — Z992 Dependence on renal dialysis: Secondary | ICD-10-CM

## 2021-05-29 DIAGNOSIS — I5033 Acute on chronic diastolic (congestive) heart failure: Secondary | ICD-10-CM

## 2021-05-29 LAB — CBC WITH DIFFERENTIAL/PLATELET
Abs Immature Granulocytes: 0.29 10*3/uL — ABNORMAL HIGH (ref 0.00–0.07)
Basophils Absolute: 0 10*3/uL (ref 0.0–0.1)
Basophils Relative: 0 %
Eosinophils Absolute: 0 10*3/uL (ref 0.0–0.5)
Eosinophils Relative: 0 %
HCT: 26.1 % — ABNORMAL LOW (ref 39.0–52.0)
Hemoglobin: 8.3 g/dL — ABNORMAL LOW (ref 13.0–17.0)
Immature Granulocytes: 3 %
Lymphocytes Relative: 1 %
Lymphs Abs: 0.1 10*3/uL — ABNORMAL LOW (ref 0.7–4.0)
MCH: 31.4 pg (ref 26.0–34.0)
MCHC: 31.8 g/dL (ref 30.0–36.0)
MCV: 98.9 fL (ref 80.0–100.0)
Monocytes Absolute: 0.1 10*3/uL (ref 0.1–1.0)
Monocytes Relative: 1 %
Neutro Abs: 9 10*3/uL — ABNORMAL HIGH (ref 1.7–7.7)
Neutrophils Relative %: 95 %
Platelets: 143 10*3/uL — ABNORMAL LOW (ref 150–400)
RBC: 2.64 MIL/uL — ABNORMAL LOW (ref 4.22–5.81)
RDW: 17.7 % — ABNORMAL HIGH (ref 11.5–15.5)
WBC: 9.4 10*3/uL (ref 4.0–10.5)
nRBC: 0 % (ref 0.0–0.2)

## 2021-05-29 LAB — TSH: TSH: 1.42 u[IU]/mL (ref 0.350–4.500)

## 2021-05-29 LAB — COMPREHENSIVE METABOLIC PANEL
ALT: 9 U/L (ref 0–44)
AST: 15 U/L (ref 15–41)
Albumin: 3.5 g/dL (ref 3.5–5.0)
Alkaline Phosphatase: 100 U/L (ref 38–126)
Anion gap: 21 — ABNORMAL HIGH (ref 5–15)
BUN: 132 mg/dL — ABNORMAL HIGH (ref 6–20)
CO2: 18 mmol/L — ABNORMAL LOW (ref 22–32)
Calcium: 8.8 mg/dL — ABNORMAL LOW (ref 8.9–10.3)
Chloride: 103 mmol/L (ref 98–111)
Creatinine, Ser: 22.27 mg/dL — ABNORMAL HIGH (ref 0.61–1.24)
GFR, Estimated: 2 mL/min — ABNORMAL LOW (ref >60)
Glucose, Bld: 183 mg/dL — ABNORMAL HIGH (ref 70–99)
Potassium: 5.8 mmol/L — ABNORMAL HIGH (ref 3.5–5.1)
Sodium: 142 mmol/L (ref 135–145)
Total Bilirubin: 1.4 mg/dL — ABNORMAL HIGH (ref 0.3–1.2)
Total Protein: 8.1 g/dL (ref 6.5–8.1)

## 2021-05-29 LAB — MAGNESIUM: Magnesium: 3 mg/dL — ABNORMAL HIGH (ref 1.7–2.4)

## 2021-05-29 LAB — PHOSPHORUS: Phosphorus: 9.4 mg/dL — ABNORMAL HIGH (ref 2.5–4.6)

## 2021-05-29 LAB — VITAMIN B12: Vitamin B-12: 528 pg/mL (ref 180–914)

## 2021-05-29 MED ORDER — NICOTINE 14 MG/24HR TD PT24: 14.0000 mg | MEDICATED_PATCH | Freq: Every day | TRANSDERMAL | Status: DC

## 2021-05-29 MED ORDER — SODIUM CHLORIDE 0.9 % IV SOLN: 250.0000 mL | INTRAVENOUS | Status: DC | PRN

## 2021-05-29 MED ORDER — HYDROXYZINE HCL 25 MG PO TABS
25.0000 mg | ORAL_TABLET | Freq: Three times a day (TID) | ORAL | Status: DC
  Administered 2021-05-29: 25 mg via ORAL
  Filled 2021-05-29: qty 1

## 2021-05-29 MED ORDER — DIPHENHYDRAMINE HCL 50 MG/ML IJ SOLN
INTRAMUSCULAR | Status: AC
  Filled 2021-05-29: qty 1

## 2021-05-29 MED ORDER — TRIAMCINOLONE ACETONIDE 0.1 % EX CREA
1.0000 "application " | TOPICAL_CREAM | Freq: Two times a day (BID) | CUTANEOUS | Status: DC
  Filled 2021-05-29 (×2): qty 15

## 2021-05-29 MED ORDER — CALCIUM ACETATE (PHOS BINDER) 667 MG PO CAPS
1334.0000 mg | ORAL_CAPSULE | Freq: Three times a day (TID) | ORAL | Status: DC
  Administered 2021-05-29: 1334 mg via ORAL
  Filled 2021-05-29 (×5): qty 2

## 2021-05-29 MED ORDER — GUAIFENESIN ER 600 MG PO TB12
600.0000 mg | ORAL_TABLET | Freq: Two times a day (BID) | ORAL | Status: DC
  Administered 2021-05-29: 600 mg via ORAL
  Filled 2021-05-29: qty 1

## 2021-05-29 MED ORDER — HYDROCODONE-ACETAMINOPHEN 5-325 MG PO TABS
1.0000 | ORAL_TABLET | ORAL | Status: DC | PRN
  Administered 2021-05-29: 1 via ORAL
  Filled 2021-05-29: qty 1

## 2021-05-29 MED ORDER — CHLORHEXIDINE GLUCONATE CLOTH 2 % EX PADS: 6.0000 | MEDICATED_PAD | Freq: Every day | CUTANEOUS | Status: DC

## 2021-05-29 MED ORDER — SODIUM CHLORIDE 0.9% FLUSH: 3.0000 mL | INTRAVENOUS | Status: DC | PRN

## 2021-05-29 MED ORDER — CINACALCET HCL 30 MG PO TABS
30.0000 mg | ORAL_TABLET | Freq: Every day | ORAL | Status: DC
  Filled 2021-05-29 (×2): qty 1

## 2021-05-29 MED ORDER — HYDRALAZINE HCL 50 MG PO TABS: 50.0000 mg | ORAL_TABLET | Freq: Three times a day (TID) | ORAL | Status: DC

## 2021-05-29 MED ORDER — HYDROXYZINE HCL 25 MG PO TABS
25.0000 mg | ORAL_TABLET | Freq: Once | ORAL | Status: AC
  Administered 2021-05-29: 25 mg via ORAL
  Filled 2021-05-29: qty 1

## 2021-05-29 MED ORDER — EPOETIN ALFA 10000 UNIT/ML IJ SOLN: 4000.0000 [IU] | INTRAMUSCULAR | Status: DC

## 2021-05-29 MED ORDER — ALBUTEROL SULFATE HFA 108 (90 BASE) MCG/ACT IN AERS: 2.0000 | INHALATION_SPRAY | RESPIRATORY_TRACT | Status: DC | PRN

## 2021-05-29 MED ORDER — SODIUM CHLORIDE 0.9% FLUSH
3.0000 mL | Freq: Two times a day (BID) | INTRAVENOUS | Status: DC
  Administered 2021-05-29: 3 mL via INTRAVENOUS

## 2021-05-29 MED ORDER — DIPHENHYDRAMINE HCL 50 MG/ML IJ SOLN
25.0000 mg | Freq: Once | INTRAMUSCULAR | Status: AC
  Administered 2021-05-29: 25 mg via INTRAVENOUS

## 2021-05-29 MED ORDER — ACETAMINOPHEN 650 MG RE SUPP: 650.0000 mg | Freq: Four times a day (QID) | RECTAL | Status: DC | PRN

## 2021-05-29 MED ORDER — ACETAMINOPHEN 325 MG PO TABS: 650.0000 mg | ORAL_TABLET | Freq: Four times a day (QID) | ORAL | Status: DC | PRN

## 2021-05-29 MED ORDER — THIAMINE HCL 100 MG PO TABS: 100.0000 mg | ORAL_TABLET | Freq: Every day | ORAL | Status: DC

## 2021-05-29 NOTE — Progress Notes (Addendum)
1500 Pt requesting to leave AMA, he signed the paperwork. Pt having difficulty obtaining a ride and waiting in his room. IV removed by patient. Pt is angry and yelling at staff when they enter his room. He is walking independently in the room and appears stable on his feet.   Pt left AMA at 1845. He is aware he needs to follow up with his dialysis clinic about his next appointment.

## 2021-05-29 NOTE — Progress Notes (Signed)
PT Cancellation Note  Patient Details Name: JERROD SKAHAN MRN: [id number] DOB: 1965/10/13   Cancelled Treatment:    Reason Eval/Treat Not Completed: Medical issues which prohibited therapy: Pt's recent K+ 5.8 falling outside guidelines for participation with PT services.  Will attempt to see pt at a future date/time as medically appropriate.     Linus Salmons PT, DPT 05/29/21, 8:34 AM

## 2021-05-29 NOTE — Progress Notes (Signed)
[facility]  ROUNDING NOTE   Subjective:   Jordan [name] is a 55 year old male with a past medical history of alcohol abuse, anemia, via diastolic chronic heart failure, ACS, COPD, hypertension, end-stage renal disease on hemodialysis.  He presents to the emergency room complaining of shortness of breath.  He has been admitted for Shortness of breath [R06.02] Hypoxia [R09.02] ESRD (end stage renal disease) ([place]) [N18.6] COPD exacerbation ([place]) [J44.1] Symptomatic anemia [D64.9] Other hypervolemia [E87.79]  Patient known to our clinic from previous admissions. Currently receives outpatient treatment at [facility], under the care of [facility] physicians.  According to outpatient treatment center, patient received his last treatment on April 26, 2021.  Patient is unable to verbalize when his last treatment was received.  Patient appears to be severely fluid overloaded and labs indicate hyperkalemia, 5.6.  Chest x-ray completed in the ED shows interstitial edema.  Patient seen and evaluated during hemodialysis.   HEMODIALYSIS FLOWSHEET:  Blood Flow Rate (mL/min): 400 mL/min Arterial Pressure (mmHg): -140 mmHg Venous Pressure (mmHg): 180 mmHg Transmembrane Pressure (mmHg): 80 mmHg Ultrafiltration Rate (mL/min): 830 mL/min Dialysate Flow Rate (mL/min): 500 ml/min Conductivity: Machine : 13.7 Conductivity: Machine : 13.7  Patient appears agitated during dialysis.  Unable to verbalize his concerns.  Requesting to be taken off dialysis machine early, states he just can't do it today.   Objective:  Vital signs in last 24 hours:  Temp:  [97.6 F (36.4 C)-99.4 F (37.4 C)] 97.8 F (36.6 C) (09/07 0800) Pulse Rate:  [92-141] 95 (09/07 1045) Resp:  [14-25] 24 (09/07 1115) BP: (104-156)/(69-100) 127/84 (09/07 1115) SpO2:  [88 %-100 %] 100 % (09/07 0800) Weight:  [73 kg-77.9 kg] 77.9 kg (09/07 0236)  Weight change:  Filed Weights   05/28/21 1741 05/29/21 0236  Weight:  73 kg 77.9 kg    Intake/Output: I/O last 3 completed shifts: In: 378.8 [I.V.:8.8; Blood:320; IV Piggyback:50] Out: -    Intake/Output this shift:  No intake/output data recorded.  Physical Exam: General: NAD, laying in bed, restless  Head: Normocephalic, atraumatic. Moist oral mucosal membranes  Eyes: Anicteric  Lungs:  Crackles, wheeze bilaterally, [place] O2  Heart: Tachycardia  Abdomen:  Soft, nontender  Extremities:  2+ peripheral edema.  Neurologic: Alert, moving all four extremities  Skin: No lesions  Access: Lt AVF    Basic Metabolic Panel: Recent Labs  Lab 05/22/21 2303 05/28/21 1747 05/29/21 0639  NA 140 142 142  K 4.9 5.6* 5.8*  CL 100 106 103  CO2 18* 16* 18*  GLUCOSE 83 110* 183*  BUN 109* 108* 132*  CREATININE 19.29* 22.51* 22.27*  CALCIUM 7.8* 8.4* 8.8*  MG  --   --  3.0*  PHOS  --   --  9.4*    Liver Function Tests: Recent Labs  Lab 05/29/21 0639  AST 15  ALT 9  ALKPHOS 100  BILITOT 1.4*  PROT 8.1  ALBUMIN 3.5   No results for input(s): LIPASE, AMYLASE in the last 168 hours. No results for input(s): AMMONIA in the last 168 hours.  CBC: Recent Labs  Lab 05/22/21 2303 05/28/21 1747 05/29/21 0639  WBC 7.8 9.6 9.4  NEUTROABS  --   --  9.0*  HGB 7.6* 6.9* 8.3*  HCT 23.8* 22.2* 26.1*  MCV 102.1* 104.2* 98.9  PLT 149* 150 143*    Cardiac Enzymes: No results for input(s): CKTOTAL, CKMB, CKMBINDEX, TROPONINI in the last 168 hours.  BNP: Invalid input(s): POCBNP  CBG: No results  for input(s): GLUCAP in the last 168 hours.  Microbiology: Results for orders placed or performed during the hospital encounter of 05/28/21  Resp Panel by RT-PCR (Flu A&B, Covid) Nasopharyngeal Swab     Status: None   Collection Time: 05/28/21  7:41 PM   Specimen: Nasopharyngeal Swab; Nasopharyngeal(NP) swabs in vial transport medium  Result Value Ref Range Status   SARS Coronavirus 2 by RT PCR NEGATIVE NEGATIVE Final    Comment: (NOTE) SARS-CoV-2 target  nucleic acids are NOT DETECTED.  The SARS-CoV-2 RNA is generally detectable in upper respiratory specimens during the acute phase of infection. The lowest concentration of SARS-CoV-2 viral copies this assay can detect is 138 copies/mL. A negative result does not preclude SARS-Cov-2 infection and should not be used as the sole basis for treatment or other patient management decisions. A negative result may occur with  improper specimen collection/handling, submission of specimen other than nasopharyngeal swab, presence of viral mutation(s) within the areas targeted by this assay, and inadequate number of viral copies(<138 copies/mL). A negative result must be combined with clinical observations, patient history, and epidemiological information. The expected result is Negative.  Fact Sheet for Patients:  EntrepreneurPulse.com.au  Fact Sheet for Healthcare Providers:  IncredibleEmployment.be  This test is no t yet approved or cleared by the [place] FDA and  has been authorized for detection and/or diagnosis of SARS-CoV-2 by FDA under an Emergency Use Authorization (EUA). This EUA will remain  in effect (meaning this test can be used) for the duration of the COVID-19 declaration under Section 564(b)(1) of the Act, 21 U.S.C.section 360bbb-3(b)(1), unless the authorization is terminated  or revoked sooner.       Influenza A by PCR NEGATIVE NEGATIVE Final   Influenza B by PCR NEGATIVE NEGATIVE Final    Comment: (NOTE) The Xpert Xpress SARS-CoV-2/FLU/RSV plus assay is intended as an aid in the diagnosis of influenza from Nasopharyngeal swab specimens and should not be used as a sole basis for treatment. Nasal washings and aspirates are unacceptable for Xpert Xpress SARS-CoV-2/FLU/RSV testing.  Fact Sheet for Patients: EntrepreneurPulse.com.au  Fact Sheet for Healthcare  Providers: IncredibleEmployment.be  This test is not yet approved or cleared by the [place] FDA and has been authorized for detection and/or diagnosis of SARS-CoV-2 by FDA under an Emergency Use Authorization (EUA). This EUA will remain in effect (meaning this test can be used) for the duration of the COVID-19 declaration under Section 564(b)(1) of the Act, 21 U.S.C. section 360bbb-3(b)(1), unless the authorization is terminated or revoked.  Performed at [facility], [address] [place], [place] [address]     Coagulation Studies: No results for input(s): LABPROT, INR in the last 72 hours.  Urinalysis: No results for input(s): COLORURINE, LABSPEC, PHURINE, GLUCOSEU, HGBUR, BILIRUBINUR, KETONESUR, PROTEINUR, UROBILINOGEN, NITRITE, LEUKOCYTESUR in the last 72 hours.  Invalid input(s): APPERANCEUR    Imaging: DG Chest 2 View  Result Date: 05/28/2021 CLINICAL DATA:  Shortness of breath. EXAM: CHEST - 2 VIEW COMPARISON:  05/22/2021 FINDINGS: Stable cardiac enlargement. Diffuse increase interstitial markings throughout both lungs compatible with interstitial edema. No pleural effusion identified. No airspace consolidation. IMPRESSION: Cardiac enlargement with diffuse increase interstitial markings compatible with interstitial edema. Electronically Signed   By: Kerby Moors M.D.   On: 05/28/2021 19:00     Medications:    sodium chloride      diphenhydrAMINE       calcium acetate  1,334 mg Oral TID WC   Chlorhexidine Gluconate Cloth  6 each Topical Q0600   cinacalcet  30 mg Oral Q breakfast   guaiFENesin  600 mg Oral BID   hydrALAZINE  50 mg Oral TID   hydrOXYzine  25 mg Oral TID   nicotine  14 mg Transdermal Daily   sodium chloride flush  3 mL Intravenous Q12H   thiamine  100 mg Oral Daily   triamcinolone cream  1 application Topical BID   sodium chloride, acetaminophen **OR** acetaminophen, albuterol, HYDROcodone-acetaminophen, sodium  chloride flush  Assessment/ Plan:  Jordan [name] is a 55 y.o.  male with a past medical history of alcohol abuse, anemia, via diastolic chronic heart failure, ACS, COPD, hypertension, end-stage renal disease on hemodialysis.  He presents to the emergency room complaining of shortness of breath.  He has been admitted for Shortness of breath [R06.02] Hypoxia [R09.02] ESRD (end stage renal disease) ([place]) [N18.6] COPD exacerbation ([place]) [J44.1] Symptomatic anemia [D64.9] Other hypervolemia [E87.79]  [facility] [address]/MWF/Lt AVF/67kg  End-stage renal disease with fluid overload and hyperkalemia on hemodialysis.  Will maintain outpatient schedule, if possible.  Last reported dialysis occurred on April 26, 2021.  Receive dialysis today, UF goal 2 L achieved.  Patient became very restless, agitated, yelling and screaming towards the end of his treatment.  Requested treatment be terminated today and resume tomorrow.  HDRN able to motivate patient to complete treatment today.  Based on the amount of missed treatments and fluid overload status, patient may require dialysis tomorrow. IV Furosemide '120mg'$  given once. IV Benadryl '25mg'$  given during dialysis for pruritis.   Anemia of chronic kidney disease Lab Results  Component Value Date   HGB 8.3 (L) 05/29/2021  Hgb below target, will order EPO with next treatment  3. Secondary Hyperparathyroidism:  Lab Results  Component Value Date   PTH 669 (H) 02/20/2020   CALCIUM 8.8 (L) 05/29/2021   CAION 1.26 04/07/2020   PHOS 9.4 (H) 05/29/2021    Calcium and Phosphorus not at goal Will resume calcium acetate with meals Cinacalcet daily as outpatient  4. Hypertension Home regimen includes Hydralazine.  Currently receiving Hydralazine BP 137/83     LOS: 1   9/7/202212:15 PM

## 2021-05-29 NOTE — Progress Notes (Signed)
OT Cancellation Note  Patient Details Name: Jordan [name] MRN: [id number] DOB: 1965/10/21   Cancelled Treatment:    Reason Eval/Treat Not Completed: Medical issues which prohibited therapy. Chart reviewed - pt noted to have K+ critically high at 5.8 and troponins elevated; contraindicated for exertional activity at this time. Will continue to follow and initiate services as pt medically appropriate to participate in therapy.   Dessie Coma, M.S. OTR/L  05/29/21, 8:12 AM  ascom [phone]

## 2021-05-29 NOTE — Plan of Care (Signed)

## 2021-05-29 NOTE — Progress Notes (Signed)
Patient ID: Jordan [name], male   DOB: 04-30-1966, 55 y.o.   MRN: [id number]  PROGRESS NOTE    Jordan [name]  [id number] DOB: 1965-11-17 DOA: 05/28/2021 PCP: Pcp, No   Brief Narrative:  55 year old male with history of end-stage renal disease on hemodialysis, COPD, alcohol abuse, anemia, hypertension, chronic diastolic CHF, tobacco abuse, A. fib, recent hospitalization from 04/29/2021-05/04/2021 for volume overload secondary to missed hemodialysis and incidental COVID positive treated with monoclonal antibody presented with worsening shortness of breath due to missed dialysis.  On presentation, patient was noted to be in fluid overload along with potassium of 5.6.  Nephrology was consulted.  Assessment & Plan:   Fluid overload/pulmonary edema possibly from missed hemodialysis End-stage renal disease on hemodialysis Noncompliance to hemodialysis treatments Hyperkalemia -Patient presented with worsening shortness of breath and was found to be volume overloaded on presentation because of missing his hemodialysis. -Potassium 5.6 on presentation and 5.8 this morning. -Nephrology has been consulted on admission and is planning to do hemodialysis today.  Anemia of chronic disease -From above.  Hemoglobin 6.9 on presentation.  Transfused 1 unit packed red cells.  Hemoglobin 8.3 this morning  Acute on chronic diastolic CHF -Volume managed by dialysis.  Strict input and output.  Daily weights.  Fluid restriction  Acute on chronic hypoxic respiratory failure -Possibly from fluid overload.  Uses 2 L oxygen via nasal cannula at home.  Required 4 L oxygen on presentation.  Currently down to 2 L oxygen.  Recent COVID-19 infection -Tested positive for COVID-19 during last admission in August 2022 and was treated with 1 dose of monoclonal antibody.  He has completed isolation.  COVID test negative on this admission  Paroxysmal A. fib -Currently intermittently tachycardic.  Not on anticoagulation  as an outpatient.    Essential hypertension -blood pressure intermittently elevated.  Continue hydralazine  Alcohol abuse -In remission  Pruritus Chronic.  Continue Atarax  Chronic tobacco use -Patient was counseled regarding this situation by admitting hospitalist.  Continue nicotine patch.  Generalized deconditioning -PT eval   DVT prophylaxis: SCDs Code Status: Full Family Communication: None at bedside Disposition Plan: Status is: Inpatient  Remains inpatient appropriate because:Inpatient level of care appropriate due to severity of illness  Dispo: The patient is from: Home              Anticipated d/c is to: Home              Patient currently is not medically stable to d/c.   Difficult to place patient No  Consultants: Nephrology  Procedures: None  Antimicrobials: None   Subjective: Patient seen and examined at bedside.  Poor historian.  He denies worsening shortness of breath.  Complains of itching.  No overnight fever or vomiting reported.  Objective: Vitals:   05/29/21 0200 05/29/21 0236 05/29/21 0428 05/29/21 0800  BP: (!) 148/89 138/77 104/69 (!) 155/93  Pulse: 96 (!) 103 (!) 101 (!) 111  Resp:  '20 20 18  '$ Temp:  97.6 F (36.4 C) 99.4 F (37.4 C) 97.8 F (36.6 C)  TempSrc:  Oral Oral Oral  SpO2: 99% 100% 100% 100%  Weight:  77.9 kg    Height:        Intake/Output Summary (Last 24 hours) at 05/29/2021 1010 Last data filed at 05/29/2021 0159 Gross per 24 hour  Intake 378.83 ml  Output --  Net 378.83 ml   Filed Weights   05/28/21 1741 05/29/21 0236  Weight: 73 kg 77.9 kg  Examination:  General exam: Appears calm and comfortable.  Looks chronically ill and deconditioned.  Currently on 2 L oxygen via nasal cannula Respiratory system: Bilateral decreased breath sounds at bases with scattered crackles Cardiovascular system: S1 & S2 heard, intermittently tachycardic Gastrointestinal system: Abdomen is nondistended, soft and nontender. Normal  bowel sounds heard. Extremities: No cyanosis, clubbing; trace lower extremity edema bilaterally  Central nervous system: Sleepy, wakes up slightly, slow to respond.  Poor historian.  No focal neurological deficits. Moving extremities Skin: No rashes, lesions or ulcers Psychiatry: Affect is mostly flat.  Does not participate in conversation much.   Data Reviewed: I have personally reviewed following labs and imaging studies  CBC: Recent Labs  Lab 05/22/21 2303 05/28/21 1747 05/29/21 0639  WBC 7.8 9.6 9.4  NEUTROABS  --   --  9.0*  HGB 7.6* 6.9* 8.3*  HCT 23.8* 22.2* 26.1*  MCV 102.1* 104.2* 98.9  PLT 149* 150 [id number]*   Basic Metabolic Panel: Recent Labs  Lab 05/22/21 2303 05/28/21 1747 05/29/21 0639  NA 140 142 142  K 4.9 5.6* 5.8*  CL 100 106 103  CO2 18* 16* 18*  GLUCOSE 83 110* 183*  BUN 109* 108* 132*  CREATININE 19.29* 22.51* 22.27*  CALCIUM 7.8* 8.4* 8.8*  MG  --   --  3.0*  PHOS  --   --  9.4*   GFR: Estimated Creatinine Clearance: 3.6 mL/min (A) (by C-G formula based on SCr of 22.27 mg/dL (H)). Liver Function Tests: Recent Labs  Lab 05/29/21 0639  AST 15  ALT 9  ALKPHOS 100  BILITOT 1.4*  PROT 8.1  ALBUMIN 3.5   No results for input(s): LIPASE, AMYLASE in the last 168 hours. No results for input(s): AMMONIA in the last 168 hours. Coagulation Profile: No results for input(s): INR, PROTIME in the last 168 hours. Cardiac Enzymes: No results for input(s): CKTOTAL, CKMB, CKMBINDEX, TROPONINI in the last 168 hours. BNP (last 3 results) No results for input(s): PROBNP in the last 8760 hours. HbA1C: No results for input(s): HGBA1C in the last 72 hours. CBG: No results for input(s): GLUCAP in the last 168 hours. Lipid Profile: No results for input(s): CHOL, HDL, LDLCALC, TRIG, CHOLHDL, LDLDIRECT in the last 72 hours. Thyroid Function Tests: Recent Labs    05/29/21 0639  TSH 1.420   Anemia Panel: Recent Labs    05/28/21 2115  FOLATE 13.3   FERRITIN 930*  TIBC 337  IRON 123  RETICCTPCT 2.7   Sepsis Labs: No results for input(s): PROCALCITON, LATICACIDVEN in the last 168 hours.  Recent Results (from the past 240 hour(s))  Resp Panel by RT-PCR (Flu A&B, Covid) Nasopharyngeal Swab     Status: None   Collection Time: 05/28/21  7:41 PM   Specimen: Nasopharyngeal Swab; Nasopharyngeal(NP) swabs in vial transport medium  Result Value Ref Range Status   SARS Coronavirus 2 by RT PCR NEGATIVE NEGATIVE Final    Comment: (NOTE) SARS-CoV-2 target nucleic acids are NOT DETECTED.  The SARS-CoV-2 RNA is generally detectable in upper respiratory specimens during the acute phase of infection. The lowest concentration of SARS-CoV-2 viral copies this assay can detect is 138 copies/mL. A negative result does not preclude SARS-Cov-2 infection and should not be used as the sole basis for treatment or other patient management decisions. A negative result may occur with  improper specimen collection/handling, submission of specimen other than nasopharyngeal swab, presence of viral mutation(s) within the areas targeted by this assay, and inadequate number of viral  copies(<138 copies/mL). A negative result must be combined with clinical observations, patient history, and epidemiological information. The expected result is Negative.  Fact Sheet for Patients:  EntrepreneurPulse.com.au  Fact Sheet for Healthcare Providers:  IncredibleEmployment.be  This test is no t yet approved or cleared by the [place] FDA and  has been authorized for detection and/or diagnosis of SARS-CoV-2 by FDA under an Emergency Use Authorization (EUA). This EUA will remain  in effect (meaning this test can be used) for the duration of the COVID-19 declaration under Section 564(b)(1) of the Act, 21 U.S.C.section 360bbb-3(b)(1), unless the authorization is terminated  or revoked sooner.       Influenza A by PCR NEGATIVE  NEGATIVE Final   Influenza B by PCR NEGATIVE NEGATIVE Final    Comment: (NOTE) The Xpert Xpress SARS-CoV-2/FLU/RSV plus assay is intended as an aid in the diagnosis of influenza from Nasopharyngeal swab specimens and should not be used as a sole basis for treatment. Nasal washings and aspirates are unacceptable for Xpert Xpress SARS-CoV-2/FLU/RSV testing.  Fact Sheet for Patients: EntrepreneurPulse.com.au  Fact Sheet for Healthcare Providers: IncredibleEmployment.be  This test is not yet approved or cleared by the [place] FDA and has been authorized for detection and/or diagnosis of SARS-CoV-2 by FDA under an Emergency Use Authorization (EUA). This EUA will remain in effect (meaning this test can be used) for the duration of the COVID-19 declaration under Section 564(b)(1) of the Act, 21 U.S.C. section 360bbb-3(b)(1), unless the authorization is terminated or revoked.  Performed at [facility], [address], [place], [place] [address]          Radiology Studies: DG Chest 2 View  Result Date: 05/28/2021 CLINICAL DATA:  Shortness of breath. EXAM: CHEST - 2 VIEW COMPARISON:  05/22/2021 FINDINGS: Stable cardiac enlargement. Diffuse increase interstitial markings throughout both lungs compatible with interstitial edema. No pleural effusion identified. No airspace consolidation. IMPRESSION: Cardiac enlargement with diffuse increase interstitial markings compatible with interstitial edema. Electronically Signed   By: Kerby Moors M.D.   On: 05/28/2021 19:00        Scheduled Meds:  calcium acetate  1,334 mg Oral TID WC   Chlorhexidine Gluconate Cloth  6 each Topical Q0600   cinacalcet  30 mg Oral Q breakfast   guaiFENesin  600 mg Oral BID   hydrALAZINE  50 mg Oral TID   hydrOXYzine  25 mg Oral TID   nicotine  14 mg Transdermal Daily   sodium chloride flush  3 mL Intravenous Q12H   thiamine  100 mg Oral Daily    triamcinolone cream  1 application Topical BID   Continuous Infusions:  sodium chloride            Aline August, MD [facility] 05/29/2021, 10:10 AM

## 2021-05-30 LAB — BPAM RBC
Blood Product Expiration Date: 202209142359
Blood Product Expiration Date: 202209182359
Blood Product Expiration Date: 202209192359
Blood Product Expiration Date: 202209222359
ISSUE DATE / TIME: 202209070038
Unit Type and Rh: 9500
Unit Type and Rh: 9500
Unit Type and Rh: 9500
Unit Type and Rh: 9500

## 2021-05-30 LAB — TYPE AND SCREEN
ABO/RH(D): O NEG
Antibody Screen: POSITIVE
Donor AG Type: NEGATIVE
PT AG Type: NEGATIVE
Unit division: 0
Unit division: 0
Unit division: 0
Unit division: 0

## 2021-05-30 LAB — PREPARE RBC (CROSSMATCH)

## 2021-05-30 NOTE — Discharge Summary (Signed)
[facility] Discharge Summary   Patient: Jordan [name] [id number]   PCP: Pcp, No DOB: Jan 01, 1966   Date of admission: 05/28/2021   Date of discharge: 05/29/2021    Discharge Disposition: Patient signed out AMA despite being explained the risks of doing so including worsening medical condition as well as death.   Discharge Diagnoses: Fluid overload/pulmonary edema possibly from missed hemodialysis End-stage renal disease on hemodialysis Noncompliance to hemodialysis treatments Hyperkalemia Anemia of chronic disease Acute on chronic diastolic CHF Acute on chronic hypoxic respiratory failure Recent COVID-19 infection Paroxysmal A. Fib Essential hypertension Alcohol abuse Pruritus Chronic tobacco use Generalized deconditioning  Discharge Condition: Guarded   Hospital Course:  55 year old male with history of end-stage renal disease on hemodialysis, COPD, alcohol abuse, anemia, hypertension, chronic diastolic CHF, tobacco abuse, A. fib, recent hospitalization from 04/29/2021-05/04/2021 for volume overload secondary to missed hemodialysis and incidental COVID positive treated with monoclonal antibody presented with worsening shortness of breath due to missed dialysis.  On presentation, patient was noted to be in fluid overload along with potassium of 5.6.  Nephrology was consulted.  Patient was supposed to have hemodialysis in the hospital but he did not complete the hemodialysis and signed out [place].  Consultations: Nephrology  The results of significant diagnostics from this hospitalization (including imaging, microbiology, ancillary and laboratory) are listed below for reference.    Significant Diagnostic Studies: CT Abdomen Pelvis Wo Contrast  Result Date: 05/10/2021 CLINICAL DATA:  Abdominal distension. End-stage renal failure on dialysis. Congestive heart failure. EXAM: CT ABDOMEN AND PELVIS WITHOUT CONTRAST TECHNIQUE: Multidetector CT imaging of the  abdomen and pelvis was performed following the standard protocol without IV contrast. COMPARISON:  04/26/2021 FINDINGS: Lower chest: Stable small pericardial effusion. Mild uniform ground-glass opacity in both lung bases is suspicious for mild pulmonary edema. Hepatobiliary: No mass visualized on this unenhanced exam. Gallbladder is unremarkable. No evidence of biliary ductal dilatation. Pancreas: No mass or inflammatory process visualized on this unenhanced exam. Spleen:  Within normal limits in size. Adrenals/Urinary tract: Severe bilateral renal atrophy again noted. Extensive renal vascular calcification again noted. No evidence of urolithiasis or hydronephrosis. Stomach/Bowel: No evidence of obstruction, inflammatory process, or abnormal fluid collections. Normal appendix visualized. Vascular/Lymphatic: No pathologically enlarged lymph nodes identified. No evidence of abdominal aortic aneurysm. Aortic atherosclerotic calcification noted. Reproductive:  No mass or other significant abnormality. Other: Large amount of ascites shows no significant change. Severe diffuse body wall edema has mildly increased since prior exam. Musculoskeletal: No suspicious bone lesions identified. Diffuse osteosclerosis again seen, consistent with renal osteodystrophy. IMPRESSION: No acute findings. Anasarca, with large amount of ascites, similar to prior exam. Stable small pericardial effusion. Stable severe bilateral renal atrophy and findings of renal osteodystrophy. No evidence of hydronephrosis. Aortic Atherosclerosis (ICD10-I70.0). Electronically Signed   By: Marlaine Hind M.D.   On: 05/10/2021 18:50   DG Chest 2 View  Result Date: 05/28/2021 CLINICAL DATA:  Shortness of breath. EXAM: CHEST - 2 VIEW COMPARISON:  05/22/2021 FINDINGS: Stable cardiac enlargement. Diffuse increase interstitial markings throughout both lungs compatible with interstitial edema. No pleural effusion identified. No airspace consolidation. IMPRESSION:  Cardiac enlargement with diffuse increase interstitial markings compatible with interstitial edema. Electronically Signed   By: Kerby Moors M.D.   On: 05/28/2021 19:00   DG Chest 2 View  Result Date: 05/22/2021 CLINICAL DATA:  Chest pain. EXAM: CHEST - 2 VIEW COMPARISON:  Chest radiograph dated 05/10/2021. FINDINGS: There is cardiomegaly. No focal consolidation, pleural effusion pneumothorax. Atherosclerotic calcification the  aorta. No acute osseous pathology. IMPRESSION: 1. No acute cardiopulmonary process. 2. Cardiomegaly. Electronically Signed   By: Anner Crete M.D.   On: 05/22/2021 23:29   DG Chest 2 View  Result Date: 05/10/2021 CLINICAL DATA:  Dyspnea EXAM: CHEST - 2 VIEW COMPARISON:  Radiograph 05/04/2021, chest CT 05/03/2021 FINDINGS: Unchanged, enlarged cardiac silhouette with a globular appearance. Diffuse interstitial and bilateral airspace opacities. No large pleural effusion or visible pneumothorax. No acute osseous abnormality. Postsurgical changes involving the left upper extremity. IMPRESSION: Unchanged large heart with appearance compatible with pericardial effusion. Diffuse interstitial opacities with bilateral airspace disease compatible with mild pulmonary edema. Electronically Signed   By: Maurine Simmering M.D.   On: 05/10/2021 13:02   CT Angio Chest Pulmonary Embolism (PE) W or WO Contrast  Result Date: 05/03/2021 CLINICAL DATA:  PE suspected, elevated D-dimer, COVID EXAM: CT ANGIOGRAPHY CHEST WITH CONTRAST TECHNIQUE: Multidetector CT imaging of the chest was performed using the standard protocol during bolus administration of intravenous contrast. Multiplanar CT image reconstructions and MIPs were obtained to evaluate the vascular anatomy. CONTRAST:  58m OMNIPAQUE IOHEXOL 350 MG/ML SOLN COMPARISON:  02/26/2021 FINDINGS: Cardiovascular: Satisfactory opacification of the pulmonary arteries to the segmental level. No evidence of pulmonary embolism. Cardiomegaly. Extensive 3 vessel  coronary artery calcifications and/or stents. Moderate pericardial effusion. Aortic atherosclerosis Mediastinum/Nodes: No enlarged mediastinal, hilar, or axillary lymph nodes. Thyroid gland, trachea, and esophagus demonstrate no significant findings. Lungs/Pleura: Diffuse bilateral ground-glass airspace attenuation, superimposed upon moderate centrilobular emphysema. No pleural effusion or pneumothorax. Upper Abdomen: Moderate volume ascites in the included upper abdomen. Somewhat coarse contour of the included liver. Musculoskeletal: No chest wall abnormality.  Renal osteodystrophy. Review of the MIP images confirms the above findings. IMPRESSION: 1. Negative examination for pulmonary embolism. 2. Diffuse bilateral ground-glass airspace attenuation, superimposed upon moderate centrilobular emphysema. Although potentially in keeping with COVID airspace disease, this appearance is generally nonspecific and may reflect a component of pulmonary edema. 3. Cardiomegaly and coronary artery disease. 4. Moderate pericardial effusion. 5. Moderate volume ascites in the included upper abdomen. 6. Somewhat coarse contour of the included liver, suggestive of cirrhosis. Aortic Atherosclerosis (ICD10-I70.0) and Emphysema (ICD10-J43.9). Electronically Signed   By: AEddie CandleM.D.   On: 05/03/2021 10:20   DG Chest Port 1 View  Result Date: 05/04/2021 CLINICAL DATA:  55year old male with cough and shortness of breath EXAM: PORTABLE CHEST 1 VIEW COMPARISON:  Chest x-ray 04/29/2021, CT chest 05/03/2021 FINDINGS: Cardiac diameter unchanged from the prior plain film, with configuration compatible with pericardial effusion. Thickening of the minor fissure.  No pleural effusion. Coarsened interstitial markings, similar to the prior chest x-ray. Minimal interlobular septal thickening. No pneumothorax. No confluent airspace disease. No displaced fracture IMPRESSION: Cardiomegaly compatible with generous pericardial effusion, present  on prior CT chest. Similar appearance of the lungs, with reticulonodular opacities, with differential including both atypical infection as well as mild pulmonary edema. No evidence of pleural effusion. Electronically Signed   By: JCorrie MckusickD.O.   On: 05/04/2021 15:21   VAS [place]OWER EXTREMITY VENOUS (DVT)  Result Date: 05/01/2021  Lower Venous DVT Study Patient Name:  CFONZO COMBEST Date of Exam:   05/01/2021 Medical Rec #: [id number]        Accession #:    [id number] of Birth: 110-12-1965        Patient Gender: M Patient Age:   89years [facility]      VAS [place]  LOWER EXTREMITY VENOUS (DVT) Referring Phys: Annamaria Boots XU --------------------------------------------------------------------------------  Indications: Edema of left leg, Covid-19.  Limitations: Posterior acoustic shadowing due to overlying arterial calcification, tissue properties due to subcutaneous edema, and patient positioning. Comparison Study: No prior studies. Performing Technologist: Darlin Coco RDMS,RVT  Examination Guidelines: A complete evaluation includes B-mode imaging, spectral Doppler, color Doppler, and power Doppler as needed of all accessible portions of each vessel. Bilateral testing is considered an integral part of a complete examination. Limited examinations for reoccurring indications may be performed as noted. The reflux portion of the exam is performed with the patient in reverse Trendelenburg.  +-----+---------------+---------+-----------+----------+--------------+ RIGHTCompressibilityPhasicitySpontaneityPropertiesThrombus Aging +-----+---------------+---------+-----------+----------+--------------+ CFV  Full           No       Yes                                 +-----+---------------+---------+-----------+----------+--------------+   +---------+---------------+---------+-----------+----------+--------------+ LEFT      CompressibilityPhasicitySpontaneityPropertiesThrombus Aging +---------+---------------+---------+-----------+----------+--------------+ CFV      Full           No       Yes                                 +---------+---------------+---------+-----------+----------+--------------+ SFJ      Full                                                        +---------+---------------+---------+-----------+----------+--------------+ FV Prox  Full                                                        +---------+---------------+---------+-----------+----------+--------------+ FV Mid   Full                                                        +---------+---------------+---------+-----------+----------+--------------+ FV DistalFull                                                        +---------+---------------+---------+-----------+----------+--------------+ PFV      Full                                                        +---------+---------------+---------+-----------+----------+--------------+ POP      Full           No       Yes                                 +---------+---------------+---------+-----------+----------+--------------+ PTV  Full                                                        +---------+---------------+---------+-----------+----------+--------------+ PERO     Full                                                        +---------+---------------+---------+-----------+----------+--------------+ Gastroc  Full                                                        +---------+---------------+---------+-----------+----------+--------------+    Summary: RIGHT: - No evidence of common femoral vein obstruction. -  LEFT: - There is no evidence of deep vein thrombosis in the lower extremity.  - No cystic structure found in the popliteal fossa.  - Pulsatile venous waveforms observed throughout extremity, suggestive of elevated  right heart pressures.  *See table(s) above for measurements and observations. Electronically signed by Harold Barban MD on 05/01/2021 at 6:54:30 PM.    Final     Microbiology: Recent Results (from the past 240 hour(s))  Resp Panel by RT-PCR (Flu A&B, Covid) Nasopharyngeal Swab     Status: None   Collection Time: 05/28/21  7:41 PM   Specimen: Nasopharyngeal Swab; Nasopharyngeal(NP) swabs in vial transport medium  Result Value Ref Range Status   SARS Coronavirus 2 by RT PCR NEGATIVE NEGATIVE Final    Comment: (NOTE) SARS-CoV-2 target nucleic acids are NOT DETECTED.  The SARS-CoV-2 RNA is generally detectable in upper respiratory specimens during the acute phase of infection. The lowest concentration of SARS-CoV-2 viral copies this assay can detect is 138 copies/mL. A negative result does not preclude SARS-Cov-2 infection and should not be used as the sole basis for treatment or other patient management decisions. A negative result may occur with  improper specimen collection/handling, submission of specimen other than nasopharyngeal swab, presence of viral mutation(s) within the areas targeted by this assay, and inadequate number of viral copies(<138 copies/mL). A negative result must be combined with clinical observations, patient history, and epidemiological information. The expected result is Negative.  Fact Sheet for Patients:  EntrepreneurPulse.com.au  Fact Sheet for Healthcare Providers:  IncredibleEmployment.be  This test is no t yet approved or cleared by the [place] FDA and  has been authorized for detection and/or diagnosis of SARS-CoV-2 by FDA under an Emergency Use Authorization (EUA). This EUA will remain  in effect (meaning this test can be used) for the duration of the COVID-19 declaration under Section 564(b)(1) of the Act, 21 U.S.C.section 360bbb-3(b)(1), unless the authorization is terminated  or revoked sooner.        Influenza A by PCR NEGATIVE NEGATIVE Final   Influenza B by PCR NEGATIVE NEGATIVE Final    Comment: (NOTE) The Xpert Xpress SARS-CoV-2/FLU/RSV plus assay is intended as an aid in the diagnosis of influenza from Nasopharyngeal swab specimens and should not be used as a sole basis for treatment. Nasal washings and aspirates are unacceptable for Xpert Xpress SARS-CoV-2/FLU/RSV testing.  Fact Sheet  for Patients: EntrepreneurPulse.com.au  Fact Sheet for Healthcare Providers: IncredibleEmployment.be  This test is not yet approved or cleared by the [place] FDA and has been authorized for detection and/or diagnosis of SARS-CoV-2 by FDA under an Emergency Use Authorization (EUA). This EUA will remain in effect (meaning this test can be used) for the duration of the COVID-19 declaration under Section 564(b)(1) of the Act, 21 U.S.C. section 360bbb-3(b)(1), unless the authorization is terminated or revoked.  Performed at [facility], [address] [place], [place] [address]      Labs: CBC: Recent Labs  Lab 05/28/21 1747 05/29/21 0639  WBC 9.6 9.4  NEUTROABS  --  9.0*  HGB 6.9* 8.3*  HCT 22.2* 26.1*  MCV 104.2* 98.9  PLT 150 [id number]*   Basic Metabolic Panel: Recent Labs  Lab 05/28/21 1747 05/29/21 0639  NA 142 142  K 5.6* 5.8*  CL 106 103  CO2 16* 18*  GLUCOSE 110* 183*  BUN 108* 132*  CREATININE 22.51* 22.27*  CALCIUM 8.4* 8.8*  MG  --  3.0*  PHOS  --  9.4*   Liver Function Tests: Recent Labs  Lab 05/29/21 0639  AST 15  ALT 9  ALKPHOS 100  BILITOT 1.4*  PROT 8.1  ALBUMIN 3.5   No results for input(s): LIPASE, AMYLASE in the last 168 hours. No results for input(s): AMMONIA in the last 168 hours. Cardiac Enzymes: No results for input(s): CKTOTAL, CKMB, CKMBINDEX, TROPONINI in the last 168 hours. BNP (last 3 results) Recent Labs    05/10/21 1301 05/22/21 2308 05/28/21 1747  BNP 3,463.3* >4,500.0*  >4,500.0*   CBG: No results for input(s): GLUCAP in the last 168 hours.  Signed:  Aline August  [facility] 05/30/2021, 11:50 AM

## 2021-06-02 ENCOUNTER — Inpatient Hospital Stay (HOSPITAL_COMMUNITY)
Admission: EM | Admit: 2021-06-02 | Discharge: 2021-06-05 | DRG: 640 | Disposition: A | Discharge status: Home Health | Payer: Medicare Other | Attending: Family Medicine | Admitting: Family Medicine

## 2021-06-02 ENCOUNTER — Encounter (HOSPITAL_COMMUNITY): Payer: Self-pay | Admitting: Emergency Medicine

## 2021-06-02 ENCOUNTER — Other Ambulatory Visit: Payer: Self-pay

## 2021-06-02 ENCOUNTER — Emergency Department (HOSPITAL_COMMUNITY): Payer: Medicare Other

## 2021-06-02 ENCOUNTER — Observation Stay (HOSPITAL_COMMUNITY): Payer: Medicare Other

## 2021-06-02 DIAGNOSIS — K648 Other hemorrhoids: Secondary | ICD-10-CM | POA: Diagnosis present

## 2021-06-02 DIAGNOSIS — N289 Disorder of kidney and ureter, unspecified: Secondary | ICD-10-CM

## 2021-06-02 DIAGNOSIS — K766 Portal hypertension: Secondary | ICD-10-CM | POA: Diagnosis present

## 2021-06-02 DIAGNOSIS — I313 Pericardial effusion (noninflammatory): Secondary | ICD-10-CM | POA: Diagnosis present

## 2021-06-02 DIAGNOSIS — I509 Heart failure, unspecified: Secondary | ICD-10-CM

## 2021-06-02 DIAGNOSIS — F1020 Alcohol dependence, uncomplicated: Secondary | ICD-10-CM | POA: Diagnosis present

## 2021-06-02 DIAGNOSIS — Z9981 Dependence on supplemental oxygen: Secondary | ICD-10-CM

## 2021-06-02 DIAGNOSIS — I5033 Acute on chronic diastolic (congestive) heart failure: Secondary | ICD-10-CM | POA: Diagnosis present

## 2021-06-02 DIAGNOSIS — Q782 Osteopetrosis: Secondary | ICD-10-CM

## 2021-06-02 DIAGNOSIS — E8770 Fluid overload, unspecified: Secondary | ICD-10-CM | POA: Diagnosis not present

## 2021-06-02 DIAGNOSIS — R188 Other ascites: Secondary | ICD-10-CM | POA: Diagnosis present

## 2021-06-02 DIAGNOSIS — Z888 Allergy status to other drugs, medicaments and biological substances status: Secondary | ICD-10-CM

## 2021-06-02 DIAGNOSIS — D631 Anemia in chronic kidney disease: Secondary | ICD-10-CM | POA: Diagnosis present

## 2021-06-02 DIAGNOSIS — K2980 Duodenitis without bleeding: Secondary | ICD-10-CM | POA: Diagnosis present

## 2021-06-02 DIAGNOSIS — Z9115 Patient's noncompliance with renal dialysis: Secondary | ICD-10-CM

## 2021-06-02 DIAGNOSIS — R21 Rash and other nonspecific skin eruption: Secondary | ICD-10-CM | POA: Diagnosis present

## 2021-06-02 DIAGNOSIS — K449 Diaphragmatic hernia without obstruction or gangrene: Secondary | ICD-10-CM | POA: Diagnosis present

## 2021-06-02 DIAGNOSIS — Z720 Tobacco use: Secondary | ICD-10-CM | POA: Diagnosis present

## 2021-06-02 DIAGNOSIS — Z66 Do not resuscitate: Secondary | ICD-10-CM | POA: Diagnosis not present

## 2021-06-02 DIAGNOSIS — Z841 Family history of disorders of kidney and ureter: Secondary | ICD-10-CM

## 2021-06-02 DIAGNOSIS — N2581 Secondary hyperparathyroidism of renal origin: Secondary | ICD-10-CM | POA: Diagnosis present

## 2021-06-02 DIAGNOSIS — F17210 Nicotine dependence, cigarettes, uncomplicated: Secondary | ICD-10-CM | POA: Diagnosis present

## 2021-06-02 DIAGNOSIS — N186 End stage renal disease: Secondary | ICD-10-CM | POA: Diagnosis present

## 2021-06-02 DIAGNOSIS — I132 Hypertensive heart and chronic kidney disease with heart failure and with stage 5 chronic kidney disease, or end stage renal disease: Secondary | ICD-10-CM | POA: Diagnosis present

## 2021-06-02 DIAGNOSIS — K921 Melena: Secondary | ICD-10-CM | POA: Diagnosis present

## 2021-06-02 DIAGNOSIS — E43 Unspecified severe protein-calorie malnutrition: Secondary | ICD-10-CM | POA: Insufficient documentation

## 2021-06-02 DIAGNOSIS — L299 Pruritus, unspecified: Secondary | ICD-10-CM | POA: Diagnosis present

## 2021-06-02 DIAGNOSIS — I5082 Biventricular heart failure: Secondary | ICD-10-CM | POA: Diagnosis present

## 2021-06-02 DIAGNOSIS — J9611 Chronic respiratory failure with hypoxia: Secondary | ICD-10-CM | POA: Diagnosis present

## 2021-06-02 DIAGNOSIS — Z79899 Other long term (current) drug therapy: Secondary | ICD-10-CM

## 2021-06-02 DIAGNOSIS — K2211 Ulcer of esophagus with bleeding: Secondary | ICD-10-CM | POA: Diagnosis present

## 2021-06-02 DIAGNOSIS — Z20822 Contact with and (suspected) exposure to covid-19: Secondary | ICD-10-CM | POA: Diagnosis present

## 2021-06-02 DIAGNOSIS — Z8616 Personal history of COVID-19: Secondary | ICD-10-CM

## 2021-06-02 DIAGNOSIS — K635 Polyp of colon: Secondary | ICD-10-CM | POA: Diagnosis present

## 2021-06-02 DIAGNOSIS — D62 Acute posthemorrhagic anemia: Secondary | ICD-10-CM | POA: Diagnosis not present

## 2021-06-02 DIAGNOSIS — Z992 Dependence on renal dialysis: Secondary | ICD-10-CM

## 2021-06-02 DIAGNOSIS — Z6827 Body mass index (BMI) 27.0-27.9, adult: Secondary | ICD-10-CM

## 2021-06-02 DIAGNOSIS — I480 Paroxysmal atrial fibrillation: Secondary | ICD-10-CM | POA: Diagnosis present

## 2021-06-02 DIAGNOSIS — Z833 Family history of diabetes mellitus: Secondary | ICD-10-CM

## 2021-06-02 DIAGNOSIS — D696 Thrombocytopenia, unspecified: Secondary | ICD-10-CM | POA: Diagnosis present

## 2021-06-02 DIAGNOSIS — K5730 Diverticulosis of large intestine without perforation or abscess without bleeding: Secondary | ICD-10-CM | POA: Diagnosis present

## 2021-06-02 DIAGNOSIS — J439 Emphysema, unspecified: Secondary | ICD-10-CM | POA: Diagnosis present

## 2021-06-02 DIAGNOSIS — Z9119 Patient's noncompliance with other medical treatment and regimen: Secondary | ICD-10-CM

## 2021-06-02 DIAGNOSIS — I10 Essential (primary) hypertension: Secondary | ICD-10-CM | POA: Diagnosis present

## 2021-06-02 HISTORY — DX: Dependence on renal dialysis: Z99.2

## 2021-06-02 HISTORY — DX: Dependence on renal dialysis: N18.6

## 2021-06-02 LAB — CBC WITH DIFFERENTIAL/PLATELET
Abs Immature Granulocytes: 0.11 10*3/uL — ABNORMAL HIGH (ref 0.00–0.07)
Basophils Absolute: 0 10*3/uL (ref 0.0–0.1)
Basophils Relative: 0 %
Eosinophils Absolute: 0.2 10*3/uL (ref 0.0–0.5)
Eosinophils Relative: 2 %
HCT: 24.9 % — ABNORMAL LOW (ref 39.0–52.0)
Hemoglobin: 7.7 g/dL — ABNORMAL LOW (ref 13.0–17.0)
Immature Granulocytes: 1 %
Lymphocytes Relative: 5 %
Lymphs Abs: 0.5 10*3/uL — ABNORMAL LOW (ref 0.7–4.0)
MCH: 31.4 pg (ref 26.0–34.0)
MCHC: 30.9 g/dL (ref 30.0–36.0)
MCV: 101.6 fL — ABNORMAL HIGH (ref 80.0–100.0)
Monocytes Absolute: 0.5 10*3/uL (ref 0.1–1.0)
Monocytes Relative: 6 %
Neutro Abs: 7.9 10*3/uL — ABNORMAL HIGH (ref 1.7–7.7)
Neutrophils Relative %: 86 %
Platelets: 106 10*3/uL — ABNORMAL LOW (ref 150–400)
RBC: 2.45 MIL/uL — ABNORMAL LOW (ref 4.22–5.81)
RDW: 16.3 % — ABNORMAL HIGH (ref 11.5–15.5)
WBC: 9.2 10*3/uL (ref 4.0–10.5)
nRBC: 0 % (ref 0.0–0.2)

## 2021-06-02 LAB — I-STAT CHEM 8, ED
BUN: >130 mg/dL — ABNORMAL HIGH (ref 6–20)
Calcium, Ion: 1.12 mmol/L — ABNORMAL LOW (ref 1.15–1.40)
Chloride: 103 mmol/L (ref 98–111)
Creatinine, Ser: 17.2 mg/dL — ABNORMAL HIGH (ref 0.61–1.24)
Glucose, Bld: 70 mg/dL (ref 70–99)
HCT: 25 % — ABNORMAL LOW (ref 39.0–52.0)
Hemoglobin: 8.5 g/dL — ABNORMAL LOW (ref 13.0–17.0)
Potassium: 4.9 mmol/L (ref 3.5–5.1)
Sodium: 138 mmol/L (ref 135–145)
TCO2: 19 mmol/L — ABNORMAL LOW (ref 22–32)

## 2021-06-02 LAB — COMPREHENSIVE METABOLIC PANEL
ALT: 13 U/L (ref 0–44)
AST: 17 U/L (ref 15–41)
Albumin: 3.4 g/dL — ABNORMAL LOW (ref 3.5–5.0)
Alkaline Phosphatase: 87 U/L (ref 38–126)
Anion gap: 21 — ABNORMAL HIGH (ref 5–15)
BUN: 121 mg/dL — ABNORMAL HIGH (ref 6–20)
CO2: 18 mmol/L — ABNORMAL LOW (ref 22–32)
Calcium: 9.6 mg/dL (ref 8.9–10.3)
Chloride: 99 mmol/L (ref 98–111)
Creatinine, Ser: 16.93 mg/dL — ABNORMAL HIGH (ref 0.61–1.24)
GFR, Estimated: 3 mL/min — ABNORMAL LOW (ref >60)
Glucose, Bld: 72 mg/dL (ref 70–99)
Potassium: 5.1 mmol/L (ref 3.5–5.1)
Sodium: 138 mmol/L (ref 135–145)
Total Bilirubin: 1.5 mg/dL — ABNORMAL HIGH (ref 0.3–1.2)
Total Protein: 7.6 g/dL (ref 6.5–8.1)

## 2021-06-02 LAB — ECHOCARDIOGRAM LIMITED
Area-P 1/2: 4.96 cm2
Height: 64 in
S' Lateral: 3.9 cm
Single Plane A4C EF: 42.1 %
Weight: 2624 oz

## 2021-06-02 LAB — BRAIN NATRIURETIC PEPTIDE: B Natriuretic Peptide: >4500 pg/mL — ABNORMAL HIGH (ref 0.0–100.0)

## 2021-06-02 IMAGING — DX DG CHEST 1V PORT
1 series · 1 of 1 positions shown · non-contrast
Comparison: [DATE], chest CT and [DATE], chest
x-ray.

CLINICAL DATA: Shortness of breath.

EXAM:
PORTABLE CHEST 1 VIEW

[chest ap]
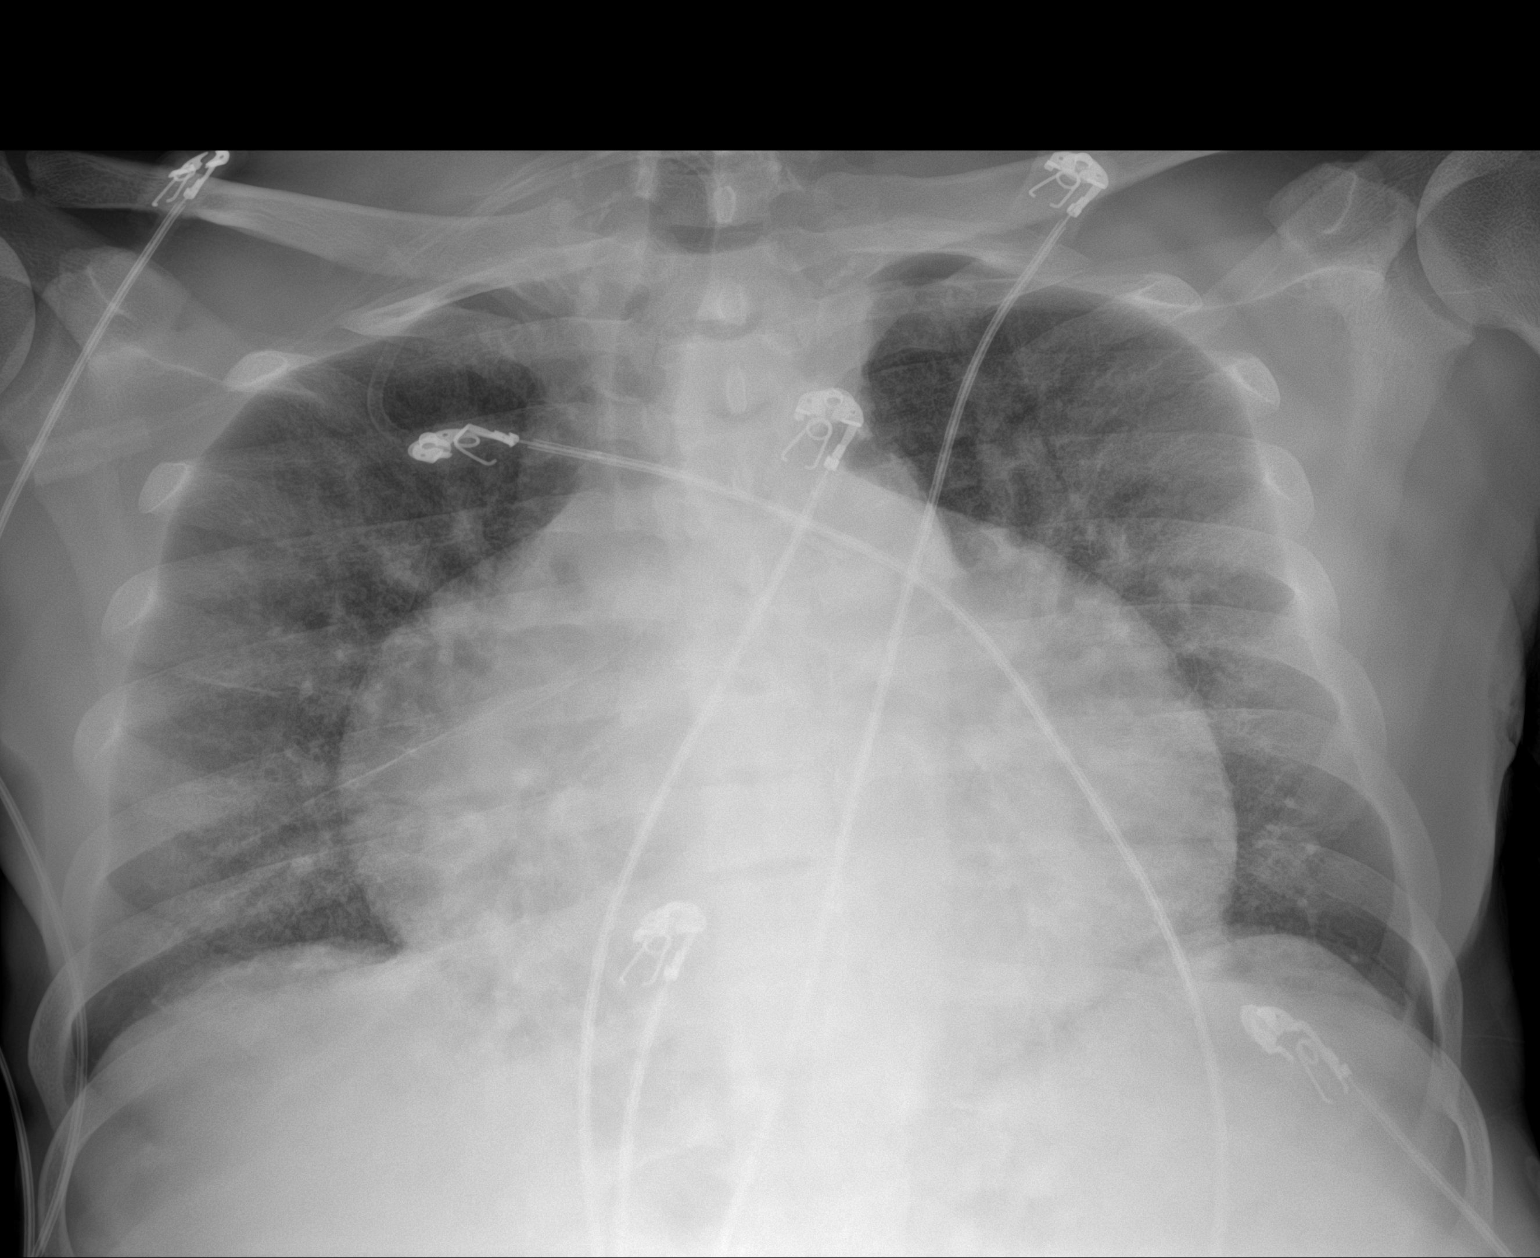

[1 of 1 positions shown; findings below may reference images not displayed]

FINDINGS: Markedly enlarged and globular cardiac silhouette.

Mild pulmonary vascular congestion.

No pleural effusion or pneumothorax seen.

Osseous structures are without acute abnormality. Soft tissues are
grossly normal.
IMPRESSION: 1. Markedly enlarged and globular cardiac silhouette. The appearance
is highly suspicious for large pericardial effusion. Please further
evaluate with cardiac echo. Cardiology consultation is also
recommended.
2. Mild pulmonary vascular congestion versus peribronchial airspace
consolidation such as seen in atypical pneumonia.

## 2021-06-02 MED ORDER — SODIUM CHLORIDE 0.9 % IV SOLN: 100.0000 mL | INTRAVENOUS | Status: DC | PRN

## 2021-06-02 MED ORDER — SODIUM CHLORIDE 0.9% FLUSH
3.0000 mL | Freq: Two times a day (BID) | INTRAVENOUS | Status: DC
  Administered 2021-06-02 – 2021-06-04 (×5): 3 mL via INTRAVENOUS

## 2021-06-02 MED ORDER — CALCIUM CARBONATE ANTACID 1250 MG/5ML PO SUSP
500.0000 mg | Freq: Four times a day (QID) | ORAL | Status: DC | PRN
  Filled 2021-06-02: qty 5

## 2021-06-02 MED ORDER — CALCIUM ACETATE (PHOS BINDER) 667 MG PO CAPS
1334.0000 mg | ORAL_CAPSULE | Freq: Three times a day (TID) | ORAL | Status: DC
  Administered 2021-06-02 – 2021-06-05 (×6): 1334 mg via ORAL
  Filled 2021-06-02 (×6): qty 2

## 2021-06-02 MED ORDER — ONDANSETRON HCL 4 MG PO TABS: 4.0000 mg | ORAL_TABLET | Freq: Four times a day (QID) | ORAL | Status: DC | PRN

## 2021-06-02 MED ORDER — NICOTINE 14 MG/24HR TD PT24
14.0000 mg | MEDICATED_PATCH | Freq: Every day | TRANSDERMAL | Status: DC
  Administered 2021-06-02 – 2021-06-05 (×4): 14 mg via TRANSDERMAL
  Filled 2021-06-02 (×4): qty 1

## 2021-06-02 MED ORDER — HYDRALAZINE HCL 50 MG PO TABS
50.0000 mg | ORAL_TABLET | Freq: Three times a day (TID) | ORAL | Status: DC
  Administered 2021-06-03 – 2021-06-05 (×7): 50 mg via ORAL
  Filled 2021-06-02 (×7): qty 1

## 2021-06-02 MED ORDER — ALBUTEROL SULFATE HFA 108 (90 BASE) MCG/ACT IN AERS
8.0000 | INHALATION_SPRAY | Freq: Once | RESPIRATORY_TRACT | Status: AC
  Administered 2021-06-02: 8 via RESPIRATORY_TRACT
  Filled 2021-06-02: qty 6.7

## 2021-06-02 MED ORDER — HYDRALAZINE HCL 20 MG/ML IJ SOLN: 5.0000 mg | INTRAMUSCULAR | Status: DC | PRN

## 2021-06-02 MED ORDER — ZOLPIDEM TARTRATE 5 MG PO TABS
5.0000 mg | ORAL_TABLET | Freq: Every evening | ORAL | Status: DC | PRN
  Administered 2021-06-03: 5 mg via ORAL
  Filled 2021-06-02: qty 1

## 2021-06-02 MED ORDER — PENTAFLUOROPROP-TETRAFLUOROETH EX AERO
1.0000 "application " | INHALATION_SPRAY | CUTANEOUS | Status: DC | PRN
  Filled 2021-06-02: qty 116

## 2021-06-02 MED ORDER — PERMETHRIN 5 % EX CREA
TOPICAL_CREAM | Freq: Once | CUTANEOUS | Status: AC
  Administered 2021-06-03: 1 via TOPICAL
  Filled 2021-06-02: qty 60

## 2021-06-02 MED ORDER — NEPRO/CARBSTEADY PO LIQD
237.0000 mL | Freq: Three times a day (TID) | ORAL | Status: DC | PRN
  Filled 2021-06-02: qty 237

## 2021-06-02 MED ORDER — LIDOCAINE HCL (PF) 1 % IJ SOLN: 5.0000 mL | INTRAMUSCULAR | Status: DC | PRN

## 2021-06-02 MED ORDER — HYDROXYZINE HCL 25 MG PO TABS
25.0000 mg | ORAL_TABLET | Freq: Three times a day (TID) | ORAL | Status: DC | PRN
  Administered 2021-06-02 – 2021-06-05 (×4): 25 mg via ORAL
  Filled 2021-06-02 (×3): qty 1

## 2021-06-02 MED ORDER — ACETAMINOPHEN 650 MG RE SUPP: 650.0000 mg | Freq: Four times a day (QID) | RECTAL | Status: DC | PRN

## 2021-06-02 MED ORDER — ALTEPLASE 2 MG IJ SOLR: 2.0000 mg | Freq: Once | INTRAMUSCULAR | Status: DC | PRN

## 2021-06-02 MED ORDER — HEPARIN SODIUM (PORCINE) 5000 UNIT/ML IJ SOLN
5000.0000 [IU] | Freq: Three times a day (TID) | INTRAMUSCULAR | Status: DC
  Administered 2021-06-02 – 2021-06-03 (×2): 5000 [IU] via SUBCUTANEOUS
  Filled 2021-06-02 (×2): qty 1

## 2021-06-02 MED ORDER — DOCUSATE SODIUM 283 MG RE ENEM
1.0000 | ENEMA | RECTAL | Status: DC | PRN
  Filled 2021-06-02: qty 1

## 2021-06-02 MED ORDER — SORBITOL 70 % SOLN
30.0000 mL | Status: DC | PRN
  Filled 2021-06-02: qty 30

## 2021-06-02 MED ORDER — ONDANSETRON HCL 4 MG/2ML IJ SOLN: 4.0000 mg | Freq: Four times a day (QID) | INTRAMUSCULAR | Status: DC | PRN

## 2021-06-02 MED ORDER — HYDROXYZINE HCL 25 MG PO TABS
25.0000 mg | ORAL_TABLET | Freq: Three times a day (TID) | ORAL | Status: DC
  Administered 2021-06-02 – 2021-06-05 (×6): 25 mg via ORAL
  Filled 2021-06-02 (×7): qty 1

## 2021-06-02 MED ORDER — CHLORHEXIDINE GLUCONATE CLOTH 2 % EX PADS
6.0000 | MEDICATED_PAD | Freq: Every day | CUTANEOUS | Status: DC
  Administered 2021-06-03: 6 via TOPICAL

## 2021-06-02 MED ORDER — GUAIFENESIN ER 600 MG PO TB12
600.0000 mg | ORAL_TABLET | Freq: Two times a day (BID) | ORAL | Status: DC
  Administered 2021-06-02 – 2021-06-05 (×7): 600 mg via ORAL
  Filled 2021-06-02 (×7): qty 1

## 2021-06-02 MED ORDER — LIDOCAINE-PRILOCAINE 2.5-2.5 % EX CREA
1.0000 "application " | TOPICAL_CREAM | CUTANEOUS | Status: DC | PRN
  Filled 2021-06-02: qty 5

## 2021-06-02 MED ORDER — HEPARIN SODIUM (PORCINE) 1000 UNIT/ML DIALYSIS
1000.0000 [IU] | INTRAMUSCULAR | Status: DC | PRN
  Filled 2021-06-02: qty 1

## 2021-06-02 MED ORDER — CAMPHOR-MENTHOL 0.5-0.5 % EX LOTN
1.0000 "application " | TOPICAL_LOTION | Freq: Three times a day (TID) | CUTANEOUS | Status: DC | PRN
  Filled 2021-06-02: qty 222

## 2021-06-02 MED ORDER — ACETAMINOPHEN 325 MG PO TABS
650.0000 mg | ORAL_TABLET | Freq: Four times a day (QID) | ORAL | Status: DC | PRN
  Administered 2021-06-03 (×2): 650 mg via ORAL
  Filled 2021-06-02 (×3): qty 2

## 2021-06-02 MED ORDER — ALBUTEROL SULFATE (2.5 MG/3ML) 0.083% IN NEBU: 3.0000 mL | INHALATION_SOLUTION | RESPIRATORY_TRACT | Status: DC | PRN

## 2021-06-02 MED ORDER — CINACALCET HCL 30 MG PO TABS
30.0000 mg | ORAL_TABLET | Freq: Every day | ORAL | Status: DC
  Administered 2021-06-03 – 2021-06-05 (×3): 30 mg via ORAL
  Filled 2021-06-02 (×3): qty 1

## 2021-06-02 NOTE — ED Provider Notes (Signed)
[facility] EMERGENCY DEPARTMENT Provider Note   CSN: [id number] Arrival date & time: 06/02/21  1056     History Chief Complaint  Patient presents with   Shortness of Breath    Jordan [name] is a 55 y.o. male.  55 yo M with a chief complaints of shortness of breath.  This has been going on the past couple days.  Feels like his swelling has gotten significantly worse over the past few days.  He is on dialysis Monday Wednesday and Friday had had missed a few days and so got a double treatment on Thursday.  He denies any chest pain has had a mild cough.  Feels like his abdomen is got distended.  Decreased bowel movements over the past week or so.  He was just admitted at [facility] regional 5 days ago.  The history is provided by the patient.  Shortness of Breath Severity:  Moderate Onset quality:  Gradual Duration:  2 days Timing:  Constant Progression:  Worsening Chronicity:  Recurrent Relieved by:  Nothing Worsened by:  Nothing Ineffective treatments:  None tried Associated symptoms: cough   Associated symptoms: no abdominal pain, no chest pain, no fever, no headaches, no rash and no vomiting       Past Medical History:  Diagnosis Date   Anemia    Aortic atherosclerosis ([place]) 11/12/2019   CKD (chronic kidney disease)    Stage 5  Dialysis - M/W/F in [place], [place]   Dyspnea    tx with inhaler when sick   ED (erectile dysfunction)    Emphysema of lung ([place]) 11/12/2019   ETOH abuse    History of blood transfusion    Hypertension    Wears dentures     Patient Active Problem List   Diagnosis Date Noted   Symptomatic anemia 05/28/2021   Acute on chronic diastolic CHF (congestive heart failure) ([place]) 05/28/2021   Hyperkalemia 05/28/2021   Pulmonary edema 04/29/2021   Pruritus 04/29/2021   COVID-19 virus infection 04/29/2021   Thrombocytopenia ([place]) 04/08/2021   COPD (chronic obstructive pulmonary disease) ([place]) 04/08/2021   CHF (congestive heart  failure) ([place]) 03/25/2021   PAF (paroxysmal atrial fibrillation) ([place]) 03/20/2021   Chronic diastolic CHF (congestive heart failure) ([place]) 03/20/2021   Alcohol abuse 03/20/2021   Vision loss of left eye 03/20/2021   SOB (shortness of breath) 03/20/2021   Shortness of breath 03/10/2021   Abscess of buttock 03/02/2021   Fluid overload 03/02/2021   Multifocal pneumonia 01/13/2021   Current mild episode of major depressive disorder without prior episode ([place]) 04/09/2020   Atrial fibrillation with RVR ([place]) 04/04/2020   Non-compliance with renal dialysis ([place]) 03/21/2020   Acute CHF (congestive heart failure) ([place]) 03/12/2020   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 ([place]) 02/03/2020   Acute on chronic combined systolic and diastolic CHF (congestive heart failure) ([place]) 02/03/2020   Acute respiratory failure with hypoxia ([place]) 11/12/2019   Aortic atherosclerosis ([place]) 11/12/2019   Emphysema of lung ([place]) 11/12/2019   Acute on chronic congestive heart failure ([place]) 10/22/2019   Volume overload 10/05/2019   Elevated troponin 05/17/2018   Anemia in ESRD (end-stage renal disease) ([place]) 05/17/2018   ESRD on dialysis ([place]) 05/26/2017   Essential hypertension 05/26/2017   Moderate protein-calorie malnutrition ([place]) 08/13/2016   Secondary hyperparathyroidism of renal origin ([place]) 08/13/2016   Tobacco abuse 08/04/2016    Past Surgical History:  Procedure Laterality Date   BASCILIC VEIN TRANSPOSITION Left 08/07/2016   Procedure: LEFT  BASILIC VEIN TRANSPOSITION;  Surgeon: Angelia Mould, MD;  Location: [place];  Service: Vascular;  Laterality: Left;   CLOSED REDUCTION NASAL FRACTURE N/A 08/11/2019   Procedure: CLOSED REDUCTION NASAL FRACTURE WITH STABILIZATION;  Surgeon: Irene Limbo, MD;  Location: [place];  Service: Plastics;  Laterality: N/A;   COLONOSCOPY N/A 08/12/2016   Procedure: COLONOSCOPY;  Surgeon: Otis Brace, MD;  Location: [place];  Service:  Gastroenterology;  Laterality: N/A;   ESOPHAGOGASTRODUODENOSCOPY N/A 08/12/2016   Procedure: ESOPHAGOGASTRODUODENOSCOPY (EGD);  Surgeon: Otis Brace, MD;  Location: [place];  Service: Gastroenterology;  Laterality: N/A;   INSERTION OF DIALYSIS CATHETER N/A 08/07/2016   Procedure: INSERTION OF TUNNELED DIALYSIS CATHETER;  Surgeon: Angelia Mould, MD;  Location: [place];  Service: Vascular;  Laterality: N/A;   LIGATION OF ARTERIOVENOUS  FISTULA Left 09/12/2016   Procedure: BANDING OF LEFT  ARTERIOVENOUS  FISTULA;  Surgeon: Angelia Mould, MD;  Location: [place];  Service: Vascular;  Laterality: Left;   LOWER EXTREMITY INTERVENTION Right 12/02/2018   Procedure: LOWER EXTREMITY INTERVENTION;  Surgeon: Algernon Huxley, MD;  Location: [facility];  Service: Cardiovascular;  Laterality: Right;       Family History  Problem Relation Age of Onset   Diabetes Mother    Kidney failure Mother    Healthy Father    Kidney failure Brother    Healthy Sister    Kidney disease Daughter    Post-traumatic stress disorder Neg Hx    Bladder Cancer Neg Hx    Kidney cancer Neg Hx     Social History   Tobacco Use   Smoking status: Every Day    Packs/day: 0.50    Years: 40.00    Pack years: 20.00    Types: Cigarettes   Smokeless tobacco: Never  Vaping Use   Vaping Use: Never used  Substance Use Topics   Alcohol use: Yes    Alcohol/week: 21.0 standard drinks    Types: 21 Cans of beer per week    Comment: last drink 6 months ago   Drug use: No    Home Medications Prior to Admission medications   Medication Sig Start Date End Date Taking? Authorizing Provider  albuterol (VENTOLIN HFA) 108 (90 Base) MCG/ACT inhaler Inhale 2 puffs into the lungs every 4 (four) hours as needed for wheezing or shortness of breath. 05/04/21   Aline August, MD  calcium acetate (PHOSLO) 667 MG capsule Take 2 capsules (1,334 mg total) by mouth 3 (three) times daily with meals. 05/23/21   Naaman Plummer, MD  cinacalcet (SENSIPAR) 30 MG tablet Take 1 tablet (30 mg total) by mouth daily with breakfast. 05/04/21   Aline August, MD  epoetin alfa (EPOGEN) 4000 UNIT/ML injection Inject 1 mL (4,000 Units total) into the vein every Monday, Wednesday, and Friday with hemodialysis. 06/22/20   Enzo Bi, MD  guaiFENesin (MUCINEX) 600 MG 12 hr tablet Take 1 tablet (600 mg total) by mouth 2 (two) times daily. 05/04/21   Aline August, MD  hydrALAZINE (APRESOLINE) 50 MG tablet Take 50 mg by mouth 3 (three) times daily.    [provider]  hydrOXYzine (ATARAX/VISTARIL) 25 MG tablet Take 1 tablet (25 mg total) by mouth 3 (three) times daily. 05/04/21   Aline August, MD  Lidocaine 3 % CREA Apply 1 application topically 2 (two) times daily as needed (pain). 02/26/21   Cuthriell, Charline Bills, PA-C  Lidocaine 3 % CREA Apply 1 application topically 3 (three) times daily as  needed (itching). 05/23/21   Naaman Plummer, MD  thiamine 100 MG tablet Take 1 tablet (100 mg total) by mouth daily. 03/11/21   Lorella Nimrod, MD  triamcinolone cream (KENALOG) 0.1 % Apply 1 application topically 2 (two) times daily. 05/23/21   Naaman Plummer, MD    Allergies    Lisinopril  Review of Systems   Review of Systems  Constitutional:  Negative for chills and fever.  HENT:  Negative for congestion and facial swelling.   Eyes:  Negative for discharge and visual disturbance.  Respiratory:  Positive for cough and shortness of breath.   Cardiovascular:  Positive for leg swelling. Negative for chest pain and palpitations.  Gastrointestinal:  Positive for abdominal distention. Negative for abdominal pain, diarrhea and vomiting.  Musculoskeletal:  Negative for arthralgias and myalgias.  Skin:  Negative for color change and rash.  Neurological:  Negative for tremors, syncope and headaches.  Psychiatric/Behavioral:  Negative for confusion and dysphoric mood.    Physical Exam Updated Vital Signs BP (!) 144/103   Pulse (!) 122    Temp 98.1 F (36.7 C) (Oral)   Resp 20   Ht '5\' 4"'$  (1.626 m)   Wt 74.4 kg   SpO2 90%   BMI 28.15 kg/m   Physical Exam Vitals and nursing note reviewed.  Constitutional:      Appearance: He is well-developed.  HENT:     Head: Normocephalic and atraumatic.  Eyes:     Pupils: Pupils are equal, round, and reactive to light.  Neck:     Vascular: JVD (up to the jaw) present.  Cardiovascular:     Rate and Rhythm: Normal rate and regular rhythm.     Heart sounds: No murmur heard.   No friction rub. No gallop.  Pulmonary:     Effort: Tachypnea present. No respiratory distress.     Breath sounds: Wheezing (diffuse) and rales (bases) present.  Abdominal:     General: There is no distension.     Tenderness: There is no abdominal tenderness. There is no guarding or rebound.     Comments: Distended abdomen without tenderness.  Tympanitic to percussion superiorly.  Musculoskeletal:        General: Normal range of motion.     Cervical back: Normal range of motion and neck supple.     Right lower leg: Edema present.     Left lower leg: Edema present.     Comments: 1+ to the knees bilaterally  Skin:    Coloration: Skin is not pale.     Findings: No rash.  Neurological:     Mental Status: He is alert and oriented to person, place, and time.  Psychiatric:        Behavior: Behavior normal.    ED Results / Procedures / Treatments   Labs (all labs ordered are listed, but only abnormal results are displayed) Labs Reviewed  CBC WITH DIFFERENTIAL/PLATELET - Abnormal; Notable for the following components:      Result Value   RBC 2.45 (*)    Hemoglobin 7.7 (*)    HCT 24.9 (*)    MCV 101.6 (*)    RDW 16.3 (*)    Platelets 106 (*)    Neutro Abs 7.9 (*)    Lymphs Abs 0.5 (*)    Abs Immature Granulocytes 0.11 (*)    All other components within normal limits  BRAIN NATRIURETIC PEPTIDE - Abnormal; Notable for the following components:   B Natriuretic Peptide >4,500.0 (*)  All other  components within normal limits  I-STAT CHEM 8, ED - Abnormal; Notable for the following components:   BUN >130 (*)    Creatinine, Ser 17.20 (*)    Calcium, Ion 1.12 (*)    TCO2 19 (*)    Hemoglobin 8.5 (*)    HCT 25.0 (*)    All other components within normal limits  COMPREHENSIVE METABOLIC PANEL    EKG EKG Interpretation  Date/Time:  'Sunday June 02 2021 11:17:03 EDT Ventricular Rate:  106 PR Interval:  124 QRS Duration: 84 QT Interval:  389 QTC Calculation: 517 R Axis:   102 Text Interpretation: Sinus tachycardia Right axis deviation Low voltage, precordial leads Borderline T wave abnormalities Prolonged QT interval No significant change since last tracing Confirmed by Erabella Kuipers ([id number]) on 06/02/2021 11:51:19 AM  Radiology DG Chest Port 1 View  Result Date: 06/02/2021 CLINICAL DATA:  Shortness of breath. EXAM: PORTABLE CHEST 1 VIEW COMPARISON:  May 03, 2021, chest CT and May 28, 2021, chest x-ray. FINDINGS: Markedly enlarged and globular cardiac silhouette. Mild pulmonary vascular congestion. No pleural effusion or pneumothorax seen. Osseous structures are without acute abnormality. Soft tissues are grossly normal. IMPRESSION: 1. Markedly enlarged and globular cardiac silhouette. The appearance is highly suspicious for large pericardial effusion. Please further evaluate with cardiac echo. Cardiology consultation is also recommended. 2. Mild pulmonary vascular congestion versus peribronchial airspace consolidation such as seen in atypical pneumonia. Electronically Signed   By: Dobrinka  Dimitrova M.D.   On: 06/02/2021 13:09    Procedures Procedures   Medications Ordered in ED Medications  albuterol (VENTOLIN HFA) 108 (90 Base) MCG/ACT inhaler 8 puff (8 puffs Inhalation Given 06/02/21 1256)    ED Course  I have reviewed the triage vital signs and the nursing notes.  Pertinent labs & imaging results that were available during my care of the patient were reviewed by  me and considered in my medical decision making (see chart for details).    MDM Rules/Calculators/A&P                           54'$  yo M with a cc of sob.  Going on for the past couple days.  Clinically fluid overloaded, sounds like he is not fully compliant with dialysis.  Some wheezes on exam.  Check labs, cxr.    Potassium is normal.  Renal function is significantly elevated but seems consistent with prior.  Chest x-ray viewed by me consistent with fluid overload.  I discussed the case with Dr. Jonnie Finner, nephrology recommended medical admission and will dialyze tonight or tomorrow.  CRITICAL CARE Performed by: Cecilio Asper   Total critical care time: 35 minutes  Critical care time was exclusive of separately billable procedures and treating other patients.  Critical care was necessary to treat or prevent imminent or life-threatening deterioration.  Critical care was time spent personally by me on the following activities: development of treatment plan with patient and/or surrogate as well as nursing, discussions with consultants, evaluation of patient's response to treatment, examination of patient, obtaining history from patient or surrogate, ordering and performing treatments and interventions, ordering and review of laboratory studies, ordering and review of radiographic studies, pulse oximetry and re-evaluation of patient's condition.  The patients results and plan were reviewed and discussed.   Any x-rays performed were independently reviewed by myself.   Differential diagnosis were considered with the presenting HPI.  Medications  albuterol (VENTOLIN HFA) 108 (90  Base) MCG/ACT inhaler 8 puff (8 puffs Inhalation Given 06/02/21 1256)    Vitals:   06/02/21 1120 06/02/21 1130 06/02/21 1253 06/02/21 1300  BP:  137/89 123/81 (!) 144/103  Pulse:    (!) 122  Resp:  '19 20 20  '$ Temp:      TempSrc:      SpO2:   96% 90%  Weight: 74.4 kg     Height: '5\' 4"'$  (1.626 m)        Final diagnoses:  Hypervolemia, unspecified hypervolemia type  Non-compliance with renal dialysis [facility])    Admission/ observation were discussed with the admitting physician, patient and/or family and they are comfortable with the plan.   Final Clinical Impression(s) / ED Diagnoses Final diagnoses:  Hypervolemia, unspecified hypervolemia type  Non-compliance with renal dialysis [facility])    Rx / DC Orders ED Discharge Orders     None        Deno Etienne, DO 06/02/21 1318

## 2021-06-02 NOTE — Progress Notes (Signed)
[facility] BRIEF PROGRESS NOTE  [id number] Jordan [name] 1966/01/11   Patient seen and examined in the ED.  Reports ongoing shortness of breath for "a long time" but no worse than usual.  Wears 2L O2 at home at baseline.  States he felt confused and disoriented this morning so decided to call EMS. Last dialysis at [facility] on Thursday, prior to leaving AMA.  Has been missing outpatient HD "because I have a lot of appointments and its just too much."  Last dialysis at outpatient unit on 04/26/21.  Other symptoms include fever/chills this AM, and abdominal bloating, feels like his stomach is filling up with fluid again.  Hx paracentesis with most recent last week per pt. States he is always tired and is sleeping all the time.  Denies headache and n/v/d.   Pertinent labs include BUN>13, SCr 17.2, K 4.9, CO2 18, BUN >4500 and CXR with cardiomegaly, mild pulmonary vascular congestion.  Physical Exam General:chronically ill appearing male in mild distress Heart:RRR, no mrg Lungs:+wheeze in bases, +rhonchi throughout, increased WOB on 3L via [place] Abdomen:soft, distended +ascites Extremities:1+ LE edema Dialysis Access: LU AVF +b/t  OP HD Orders: MWF 4hrs [facility] [place] 67kg, 2K 2Ca, 400/800 LU AVF No Heparin Venofer 50qwk Mircera 233mg q2wks   Plan: Orders written for urgent HD tonight due to volume overload and uremia. Will need COVID result prior to running.    LJen Mow PA-C [facility] Pager: [phone]

## 2021-06-02 NOTE — H&P (Signed)
History and Physical    Jordan [name] [id number] DOB: [birth year] DOA: 06/02/2021  PCP: Merryl Hacker, No Consultants:  Nephrology Patient coming from:  Home - lives alone; NOK: Daughter, [phone]  Chief Complaint: SOB  HPI: Jordan [name] is a 55 y.o. male with medical history significant of ESRD on MWF HD; COPD with ongoing tobacco dependence; ETOH dependence; HTN; afib; and chronic diastolic CHF presenting with SOB.  He was previously hospitalized from 9/6-7 and signed out [place] prior to completing HD. His breathing has been bad and he has had swelling and fluid on his stomach.  He had one episode of HD when he previously but left without repeat session.  This week he only had HD on Tuesday.    ED Course: Non-compliance with HD.  Double session Thursday but still volume overloaded.  Cannot acutely dialyze on Sunday per Dr. Jonnie Finner so maybe tonight or tomorrow.  Review of Systems: As per HPI; otherwise review of systems reviewed and negative.   Ambulatory Status:  Ambulates without assistance  COVID Vaccine Status:  First shot  Past Medical History:  Diagnosis Date   Anemia    Aortic atherosclerosis ([place]) 11/12/2019   ED (erectile dysfunction)    Emphysema of lung ([place]) 11/12/2019   ESRD on hemodialysis ([place])    M/W/F in [place], [place]   ETOH abuse    History of blood transfusion    Hypertension    Wears dentures     Past Surgical History:  Procedure Laterality Date   BASCILIC VEIN TRANSPOSITION Left 08/07/2016   Procedure: LEFT BASILIC VEIN TRANSPOSITION;  Surgeon: Angelia Mould, MD;  Location: [facility];  Service: Vascular;  Laterality: Left;   CLOSED REDUCTION NASAL FRACTURE N/A 08/11/2019   Procedure: CLOSED REDUCTION NASAL FRACTURE WITH STABILIZATION;  Surgeon: Irene Limbo, MD;  Location: [facility];  Service: Plastics;  Laterality: N/A;   COLONOSCOPY N/A 08/12/2016   Procedure: COLONOSCOPY;  Surgeon: Otis Brace, MD;  Location: [facility];  Service:  Gastroenterology;  Laterality: N/A;   ESOPHAGOGASTRODUODENOSCOPY N/A 08/12/2016   Procedure: ESOPHAGOGASTRODUODENOSCOPY (EGD);  Surgeon: Otis Brace, MD;  Location: [facility];  Service: Gastroenterology;  Laterality: N/A;   INSERTION OF DIALYSIS CATHETER N/A 08/07/2016   Procedure: INSERTION OF TUNNELED DIALYSIS CATHETER;  Surgeon: Angelia Mould, MD;  Location: [facility];  Service: Vascular;  Laterality: N/A;   LIGATION OF ARTERIOVENOUS  FISTULA Left 09/12/2016   Procedure: BANDING OF LEFT  ARTERIOVENOUS  FISTULA;  Surgeon: Angelia Mould, MD;  Location: [facility];  Service: Vascular;  Laterality: Left;   LOWER EXTREMITY INTERVENTION Right 12/02/2018   Procedure: LOWER EXTREMITY INTERVENTION;  Surgeon: Algernon Huxley, MD;  Location: [facility];  Service: Cardiovascular;  Laterality: Right;    Social History   Socioeconomic History   Marital status: Single    Spouse name: Not on file   Number of children: Not on file   Years of education: Not on file   Highest education level: Not on file  Occupational History   Occupation: disabled  Tobacco Use   Smoking status: Every Day    Packs/day: 0.50    Years: 40.00    Pack years: 20.00    Types: Cigarettes   Smokeless tobacco: Never  Vaping Use   Vaping Use: Never used  Substance and Sexual Activity   Alcohol use: Not Currently    Alcohol/week: 21.0 standard drinks    Types: 21 Cans of beer per week    Comment: last drink 6  months ago   Drug use: No   Sexual activity: Not Currently  Other Topics Concern   Not on file  Social History Narrative   Not on file   Social Determinants of Health   Financial Resource Strain: Not on file  Food Insecurity: Not on file  Transportation Needs: Not on file  Physical Activity: Not on file  Stress: Not on file  Social Connections: Not on file  Intimate Partner Violence: Not on file    Allergies  Allergen Reactions   Lisinopril Swelling    Family History  Problem  Relation Age of Onset   Diabetes Mother    Kidney failure Mother    Healthy Father    Kidney failure Brother    Healthy Sister    Kidney disease Daughter    Post-traumatic stress disorder Neg Hx    Bladder Cancer Neg Hx    Kidney cancer Neg Hx     Prior to Admission medications   Medication Sig Start Date End Date Taking? Authorizing Provider  albuterol (VENTOLIN HFA) 108 (90 Base) MCG/ACT inhaler Inhale 2 puffs into the lungs every 4 (four) hours as needed for wheezing or shortness of breath. 05/04/21   Aline August, MD  calcium acetate (PHOSLO) 667 MG capsule Take 2 capsules (1,334 mg total) by mouth 3 (three) times daily with meals. 05/23/21   Naaman Plummer, MD  cinacalcet (SENSIPAR) 30 MG tablet Take 1 tablet (30 mg total) by mouth daily with breakfast. 05/04/21   Aline August, MD  epoetin alfa (EPOGEN) 4000 UNIT/ML injection Inject 1 mL (4,000 Units total) into the vein every Monday, Wednesday, and Friday with hemodialysis. 06/22/20   Enzo Bi, MD  guaiFENesin (MUCINEX) 600 MG 12 hr tablet Take 1 tablet (600 mg total) by mouth 2 (two) times daily. 05/04/21   Aline August, MD  hydrALAZINE (APRESOLINE) 50 MG tablet Take 50 mg by mouth 3 (three) times daily.    [provider]  hydrOXYzine (ATARAX/VISTARIL) 25 MG tablet Take 1 tablet (25 mg total) by mouth 3 (three) times daily. 05/04/21   Aline August, MD  Lidocaine 3 % CREA Apply 1 application topically 2 (two) times daily as needed (pain). 02/26/21   Cuthriell, Charline Bills, PA-C  Lidocaine 3 % CREA Apply 1 application topically 3 (three) times daily as needed (itching). 05/23/21   Naaman Plummer, MD  thiamine 100 MG tablet Take 1 tablet (100 mg total) by mouth daily. 03/11/21   Lorella Nimrod, MD  triamcinolone cream (KENALOG) 0.1 % Apply 1 application topically 2 (two) times daily. 05/23/21   Naaman Plummer, MD    Physical Exam: Vitals:   06/02/21 1120 06/02/21 1130 06/02/21 1253 06/02/21 1300  BP:  137/89 123/81 (!) 144/103   Pulse:    (!) 122  Resp:  '19 20 20  '$ Temp:      TempSrc:      SpO2:   96% 90%  Weight: 74.4 kg     Height: '5\' 4"'$  (1.626 m)        General:  Appears calm and comfortable and is in NAD Eyes:   EOMI, normal lids, iris ENT:  grossly normal hearing, lips & tongue, mmm; edentulous Neck:  no LAD, masses or thyromegaly Cardiovascular:  RRR, no m/r/g. 1-2+ LE edema.  Respiratory:   Diffuse rales and wheezing.  Normal respiratory effort. Abdomen:  soft, NT, +distended Back:   normal alignment, no CVAT Skin:  maculopapular rash on primarily thighs with pustules and linear  appearance, very pruritic Musculoskeletal:  grossly normal tone BUE/BLE, good ROM, no bony abnormality Psychiatric:  grossly normal mood and affect, speech fluent and appropriate, AOx3 Neurologic:  CN 2-12 grossly intact, moves all extremities in coordinated fashion   Radiological Exams on Admission: Independently reviewed - see discussion in A/P where applicable  DG Chest Port 1 View  Result Date: 06/02/2021 CLINICAL DATA:  Shortness of breath. EXAM: PORTABLE CHEST 1 VIEW COMPARISON:  May 03, 2021, chest CT and May 28, 2021, chest x-ray. FINDINGS: Markedly enlarged and globular cardiac silhouette. Mild pulmonary vascular congestion. No pleural effusion or pneumothorax seen. Osseous structures are without acute abnormality. Soft tissues are grossly normal. IMPRESSION: 1. Markedly enlarged and globular cardiac silhouette. The appearance is highly suspicious for large pericardial effusion. Please further evaluate with cardiac echo. Cardiology consultation is also recommended. 2. Mild pulmonary vascular congestion versus peribronchial airspace consolidation such as seen in atypical pneumonia. Electronically Signed   By: Fidela Salisbury M.D.   On: 06/02/2021 13:09   ECHOCARDIOGRAM LIMITED  Result Date: 06/02/2021    ECHOCARDIOGRAM LIMITED REPORT   Patient Name:   ABDOUL BLACKMON Date of Exam: 06/02/2021 Medical Rec #:   [id number]        Height:       64.0 in Accession #:    [id number]       Weight:       164.0 lb Date of Birth:  1965-11-28        BSA:          1.798 m Patient Age:    70 years         BP:           144/103 mmHg Patient Gender: M                HR:           103 bpm. Exam Location:  Inpatient Procedure: Limited Echo, Limited Color Doppler and Cardiac Doppler STAT ECHO Indications:    pericardial effusion  History:        Patient has prior history of Echocardiogram examinations, most                 recent 03/02/2021. CHF, COPD and end stage renal disease.; Risk                 Factors:Hypertension.  Sonographer:    Johny Chess RDCS Referring Phys: [place]  1. Left ventricular ejection fraction, by estimation, is 40 to 45%. The left ventricle has mildly decreased function. The left ventricle demonstrates global hypokinesis. There is mild concentric left ventricular hypertrophy. Left ventricular diastolic parameters are consistent with Grade II diastolic dysfunction (pseudonormalization).  2. Right ventricular systolic function is normal. The right ventricular size is normal. There is severely elevated pulmonary artery systolic pressure.  3. Left atrial size was mildly dilated.  4. Right atrial size was mildly dilated.  5. Compared with the echo 02/2021, there is now a small pericardial effusion. The IVC is dilated and there is absent collapse of the IVC, suggesting RA pressure is at least 15 mmHg. However, there are no other signs of tamponade. Tamponade physiology is unlikely. a small pericardial effusion is present. The pericardial effusion is circumferential. There is no evidence of cardiac tamponade. Large pleural effusion in the left lateral region.  6. The mitral valve is degenerative. Mild to moderate mitral valve regurgitation. No evidence of mitral stenosis.  7. Tricuspid valve regurgitation is moderate.  8. The aortic valve is tricuspid. There is mild calcification of the aortic  valve. There is mild thickening of the aortic valve. Aortic valve regurgitation is not visualized. No aortic stenosis is present.  9. The inferior vena cava is dilated in size with <50% respiratory variability, suggesting right atrial pressure of 15 mmHg. FINDINGS  Left Ventricle: Left ventricular ejection fraction, by estimation, is 40 to 45%. The left ventricle has mildly decreased function. The left ventricle demonstrates global hypokinesis. The left ventricular internal cavity size was normal in size. There is  mild concentric left ventricular hypertrophy. Left ventricular diastolic parameters are consistent with Grade II diastolic dysfunction (pseudonormalization). Right Ventricle: The right ventricular size is normal. No increase in right ventricular wall thickness. Right ventricular systolic function is normal. There is severely elevated pulmonary artery systolic pressure. The tricuspid regurgitant velocity is 4.21 m/s, and with an assumed right atrial pressure of 15 mmHg, the estimated right ventricular systolic pressure is 0000000 mmHg. Left Atrium: Left atrial size was mildly dilated. Right Atrium: Right atrial size was mildly dilated. Pericardium: Compared with the echo 02/2021, there is now a small pericardial effusion. The IVC is dilated and there is absent collapse of the IVC, suggesting RA pressure is at least 15 mmHg. However, there are no other signs of tamponade. Tamponade physiology is unlikely. A small pericardial effusion is present. The pericardial effusion is circumferential. There is no evidence of cardiac tamponade. Mitral Valve: The mitral valve is degenerative in appearance. Mild to moderate mitral valve regurgitation, with posteriorly-directed jet. No evidence of mitral valve stenosis. Tricuspid Valve: The tricuspid valve is normal in structure. Tricuspid valve regurgitation is moderate . No evidence of tricuspid stenosis. Aortic Valve: The aortic valve is tricuspid. There is mild  calcification of the aortic valve. There is mild thickening of the aortic valve. Aortic valve regurgitation is not visualized. No aortic stenosis is present. Pulmonic Valve: The pulmonic valve was normal in structure. Pulmonic valve regurgitation is not visualized. No evidence of pulmonic stenosis. Aorta: The aortic root is normal in size and structure. Venous: The inferior vena cava is dilated in size with less than 50% respiratory variability, suggesting right atrial pressure of 15 mmHg. IAS/Shunts: No atrial level shunt detected by color flow Doppler. Additional Comments: There is a large pleural effusion in the left lateral region. Mild ascites is present. LEFT VENTRICLE PLAX 2D LVIDd:         4.80 cm      Diastology LVIDs:         3.90 cm      LV e' medial:    5.98 cm/s LV PW:         1.30 cm      LV E/e' medial:  24.7 LV IVS:        1.30 cm      LV e' lateral:   8.16 cm/s LVOT diam:     1.90 cm      LV E/e' lateral: 18.1 LV SV:         55 LV SV Index:   30 LVOT Area:     2.84 cm  LV Volumes (MOD) LV vol d, MOD A4C: 129.0 ml LV vol s, MOD A4C: 74.7 ml LV SV MOD A4C:     129.0 ml IVC IVC diam: 2.50 cm LEFT ATRIUM         Index LA diam:    4.70 cm 2.61 cm/m  AORTIC VALVE LVOT Vmax:   123.00 cm/s LVOT Vmean:  85.200 cm/s LVOT VTI:    0.193 m  AORTA Ao Asc diam: 3.10 cm MITRAL VALVE                TRICUSPID VALVE MV Area (PHT): 4.96 cm     TR Peak grad:   70.9 mmHg MV Decel Time: 153 msec     TR Vmax:        421.00 cm/s MV E velocity: 148.00 cm/s MV A velocity: 79.80 cm/s   SHUNTS MV E/A ratio:  1.85         Systemic VTI:  0.19 m                             Systemic Diam: 1.90 cm Skeet Latch MD Electronically signed by Skeet Latch MD Signature Date/Time: 06/02/2021/2:36:10 PM    Final     EKG: Independently reviewed.  Sinus tachycardia with rate 106; prolonged QTc 517; no evidence of acute ischemia   Labs on Admission: I have personally reviewed the available labs and imaging studies at the time of  the admission.  Pertinent labs:   CO2 18 BUN 121/Creatinine 16.93/GFR 3 Anion gap 21 BNP >4500 WBC 9.2 Hgb 7.7 - stable Platelets 106   Assessment/Plan Principal Problem:   Hypervolemia associated with renal insufficiency Active Problems:   Tobacco abuse   ESRD on dialysis [facility])   Essential hypertension   Emphysema of lung ([facility])   Acute on chronic diastolic CHF (congestive heart failure) ([facility])   Hypervolemia associated with missed HD -Suspect this is related to HD non-compliance leading to volume overload -Since he is dialysis-dependent, he was unable to clear the excess fluid -He is likely to benefit from serial HD both today and tomorrow and may be appropriate for d/c to home tomorrow after HD -Certainly, he needs HD compliance, fluid restriction, and very low salt intake on an ongoing basis  -Patient on chronic MWFHD -Nephrology prn order set utilized -Nephrology is aware that patient will need HD  -Continue Phoslo, Sensipar  Rash -Very pruritic rash along lower back and mostly upper thighs -Concerning for scabies -Will treat with one course of topical permethrin; may need additional treatments for complete resolution -Continue hydralazine, add Sarna  COPD, ongoing tobacco dependence -Tobacco Dependence: encourage cessation; this was discussed with the patient and should be reviewed on an ongoing basis.   -Continue Albuterol, Mucinex -Patch ordered at patient request.   HTN -Continue hydralazine  Acute on chronic diastolic CHF -Volume management with HD     Note: This patient was recently positive for COVID and so does not requite retesting for the novel coronavirus COVID-19. The patient has NOT been fully vaccinated against COVID-19.   Level of care: Telemetry Medical DVT prophylaxis: Heparin Code Status:  DNR - confirmed with patient Family Communication: None present Disposition Plan:  The patient is from: home  Anticipated d/c is to: home without  [facility] services   Anticipated d/c date will depend on clinical response to treatment, but possibly as early as tomorrow if she has excellent response to treatment  Patient is currently: acutely ill Consults called: Nephrology; Nutrition  Admission status:  It is my clinical opinion that referral for OBSERVATION is reasonable and necessary in this patient based on the above information provided. The aforementioned taken together are felt to place the patient at high risk for further clinical deterioration. However it is anticipated that the patient may be medically stable for discharge from  the hospital within 24 to 48 hours.    Karmen Bongo MD [facility]   How to contact the [facility] Attending or Consulting provider [place] or covering provider during after hours [place], for this patient?  Check the care team in [facility] and look for a) attending/consulting [facility] provider listed and b) the [facility] team listed Log into [email or web address] and use [place]'s universal password to access. If you do not have the password, please contact the hospital operator. Locate the [facility] provider you are looking for under [facility] and page to a number that you can be directly reached. If you still have difficulty reaching the provider, please page the [facility] (Director on Call) for the Hospitalists listed on amion for assistance.   06/02/2021, 2:47 PM

## 2021-06-02 NOTE — ED Triage Notes (Addendum)
T arrives via EMS for acute on chronic SOB. Pt is M, W, F dialysis. Last tx thursday, states he doubled up. 3 L [place] at baseline.

## 2021-06-02 NOTE — Progress Notes (Signed)
  Echocardiogram 2D Echocardiogram has been performed.  Jordan [name] 06/02/2021, 2:24 PM

## 2021-06-03 ENCOUNTER — Observation Stay (HOSPITAL_COMMUNITY): Payer: Medicare Other

## 2021-06-03 DIAGNOSIS — I313 Pericardial effusion (noninflammatory): Secondary | ICD-10-CM | POA: Diagnosis not present

## 2021-06-03 DIAGNOSIS — I5023 Acute on chronic systolic (congestive) heart failure: Secondary | ICD-10-CM

## 2021-06-03 DIAGNOSIS — E8770 Fluid overload, unspecified: Secondary | ICD-10-CM | POA: Diagnosis not present

## 2021-06-03 DIAGNOSIS — N289 Disorder of kidney and ureter, unspecified: Secondary | ICD-10-CM | POA: Diagnosis not present

## 2021-06-03 LAB — BASIC METABOLIC PANEL
Anion gap: 15 (ref 5–15)
BUN: 86 mg/dL — ABNORMAL HIGH (ref 6–20)
CO2: 25 mmol/L (ref 22–32)
Calcium: 9 mg/dL (ref 8.9–10.3)
Chloride: 102 mmol/L (ref 98–111)
Creatinine, Ser: 12.82 mg/dL — ABNORMAL HIGH (ref 0.61–1.24)
GFR, Estimated: 4 mL/min — ABNORMAL LOW (ref >60)
Glucose, Bld: 99 mg/dL (ref 70–99)
Potassium: 3.8 mmol/L (ref 3.5–5.1)
Sodium: 142 mmol/L (ref 135–145)

## 2021-06-03 LAB — ECHOCARDIOGRAM LIMITED
Calc EF: 43.8 %
Height: 64 in
Single Plane A2C EF: 37.6 %
Single Plane A4C EF: 51.8 %
Weight: 2740.76 oz

## 2021-06-03 LAB — SARS CORONAVIRUS 2 (TAT 6-24 HRS): SARS Coronavirus 2: NEGATIVE

## 2021-06-03 LAB — CBC
HCT: 21.5 % — ABNORMAL LOW (ref 39.0–52.0)
Hemoglobin: 6.7 g/dL — CL (ref 13.0–17.0)
MCH: 31 pg (ref 26.0–34.0)
MCHC: 31.2 g/dL (ref 30.0–36.0)
MCV: 99.5 fL (ref 80.0–100.0)
Platelets: 96 10*3/uL — ABNORMAL LOW (ref 150–400)
RBC: 2.16 MIL/uL — ABNORMAL LOW (ref 4.22–5.81)
RDW: 15.9 % — ABNORMAL HIGH (ref 11.5–15.5)
WBC: 8.4 10*3/uL (ref 4.0–10.5)
nRBC: 0 % (ref 0.0–0.2)

## 2021-06-03 LAB — PREPARE RBC (CROSSMATCH)

## 2021-06-03 LAB — IRON AND TIBC
Iron: 63 ug/dL (ref 45–182)
Saturation Ratios: 21 % (ref 17.9–39.5)
TIBC: 298 ug/dL (ref 250–450)
UIBC: 235 ug/dL

## 2021-06-03 LAB — FERRITIN: Ferritin: 688 ng/mL — ABNORMAL HIGH (ref 24–336)

## 2021-06-03 LAB — OCCULT BLOOD X 1 CARD TO LAB, STOOL: Fecal Occult Bld: POSITIVE — AB

## 2021-06-03 MED ORDER — PANTOPRAZOLE SODIUM 40 MG IV SOLR
40.0000 mg | Freq: Two times a day (BID) | INTRAVENOUS | Status: DC
  Administered 2021-06-03 – 2021-06-05 (×5): 40 mg via INTRAVENOUS
  Filled 2021-06-03 (×5): qty 40

## 2021-06-03 MED ORDER — CHLORHEXIDINE GLUCONATE CLOTH 2 % EX PADS
6.0000 | MEDICATED_PAD | Freq: Every day | CUTANEOUS | Status: DC
  Administered 2021-06-05: 6 via TOPICAL

## 2021-06-03 MED ORDER — SODIUM CHLORIDE 0.9% IV SOLUTION: Freq: Once | INTRAVENOUS | Status: AC

## 2021-06-03 NOTE — Progress Notes (Signed)
  Echocardiogram 2D Echocardiogram has been performed.  Jordan [name] 06/03/2021, 2:06 PM

## 2021-06-03 NOTE — Progress Notes (Signed)
Pt receives out-pt treatment at [facility] on MWF. Contacted clinic via e-mail to provide update including treatment for scabies.   Melven Sartorius  Renal Navigator [phone]

## 2021-06-03 NOTE — Progress Notes (Addendum)
PROGRESS NOTE    Jordan [name]  [id number] DOB: 10-Sep-1966 DOA: 06/02/2021 PCP: Pcp, No   Chief Complain: Shortness of breath  Brief Narrative: Patient is a 55 year old male with history of ESRD on dialysis on Monday, Wednesday, Friday, COPD with ongoing tobacco use, alcohol dependence, hypertension,chronic diastolic CHF who presented with shortness of breath from home.  He is notorious for noncompliance, misses hemodialysis.  He was last hospitalized on 9/6 till 9/7 and signed out AMA after completing hemodialysis.  He said he was not feeling good so he did not go for outpatient dialysis.  Last dialysis was about a week ago.  He was admitted for volume overload.  Nephrology consulted for dialysis.    Assessment & Plan:   Principal Problem:   Hypervolemia associated with renal insufficiency Active Problems:   Tobacco abuse   ESRD on dialysis [facility]   Essential hypertension   Emphysema of lung (HCC)   Acute on chronic diastolic CHF (congestive heart failure) (HCC)   ESRD/hypervolemia secondary to missed dialysis: Noncompliance leading to missing dialysis session resulting volume overload.  He was complaining of shortness of breath on presentation. Nephrology consulted and following.  Underwent dialysis.  Currently shortness of breath has resolved, maintaining saturation on room air. He is typically dialyzed on Monday, Wednesday, Friday.  Pericardial effusion: Chest x-ray showed significant pericardial effusion.  But echo showed confirmed small pericardial effusion, no signs of tamponade.  Patient is hemodynamically stable.    Acute on chronic normocytic anemia/suspected GI bleed: Hemoglobin dropped to 6 from 8.  Patient reports that he has been seeing black tarry stools since last few days.  Denies any abdominal pain.  We will check iron studies.  We will check FOBT.  Message has been sent to [facility] He has been transfused with a unit of PRBC today.  Check hemoglobin  tomorrow. Started on IV Protonix  Rash: He has several healing papular rash on the lower extremities, has excoriations on the belly.  He was complaining of itching.  There was concern for  scabies and he was given a dose of topical permethrin.  COPD/ongoing tobacco use: Counseled for cessation.  Currently on room air.  Continue bronchodilators as needed.  Continue nicotine patch  Acute on chronic diastolic congestive heart failure: Severe elevated BNP  from volume overload.  Volume management as per dialysis  Hypertension: Continue monitoring blood pressure.  On hydralazine           DVT prophylaxis:SCD Code Status: DNR Family Communication: None at bedside Status is: Observation  The patient remains OBS appropriate and will d/c before 2 midnights.  Dispo: The patient is from: Home              Anticipated d/c is to: Home              Patient currently is not medically stable to d/c.   Difficult to place patient No    Consultants: Nephrology, GI  Procedures: Dialysis  Antimicrobials:  Anti-infectives (From admission, onward)    None       Subjective: Patient seen and examined at the bedside this morning.  Hemodynamically stable.  Overall comfortable.  On room air.  Denies any shortness of breath during my evaluation but was noted to be tachypneic.  Complains of black tarry stools since last few days, denies abdominal pain  Objective: Vitals:   06/03/21 0112 06/03/21 0142 06/03/21 0157 06/03/21 0505  BP: (!) 139/96 (!) 143/94  (!) 145/91  Pulse:  95  (!) 105  Resp: 17 (!) 25  18  Temp:   (P) 98.8 F (37.1 C) 97.6 F (36.4 C)  TempSrc:    Oral  SpO2:  98%  100%  Weight:      Height:        Intake/Output Summary (Last 24 hours) at 06/03/2021 1107 Last data filed at 06/03/2021 0157 Gross per 24 hour  Intake --  Output 4000 ml  Net -4000 ml   Filed Weights   06/02/21 1120 06/02/21 2109  Weight: 74.4 kg 77.7 kg    Examination:  General exam: Overall  comfortable, not in distress HEENT: PERRL Respiratory system:  no wheezes or crackles  Cardiovascular system: S1 & S2 heard, RRR.  Gastrointestinal system: Abdomen is nondistended, soft and nontender. Central nervous system: Alert and oriented Extremities: No edema, no clubbing ,no cyanosis, dialysis access on the left Skin: Excoriations from itching on the belly, purpuric healing rash on bilateral lower extremities    Data Reviewed: I have personally reviewed following labs and imaging studies  CBC: Recent Labs  Lab 05/28/21 1747 05/29/21 0639 06/02/21 1125 06/02/21 1152 06/03/21 0624  WBC 9.6 9.4 9.2  --  8.4  NEUTROABS  --  9.0* 7.9*  --   --   HGB 6.9* 8.3* 7.7* 8.5* 6.7*  HCT 22.2* 26.1* 24.9* 25.0* 21.5*  MCV 104.2* 98.9 101.6*  --  99.5  PLT 150 143* 106*  --  96*   Basic Metabolic Panel: Recent Labs  Lab 05/28/21 1747 05/29/21 0639 06/02/21 1125 06/02/21 1152 06/03/21 0624  NA 142 142 138 138 142  K 5.6* 5.8* 5.1 4.9 3.8  CL 106 103 99 103 102  CO2 16* 18* 18*  --  25  GLUCOSE 110* 183* 72 70 99  BUN 108* 132* 121* >130* 86*  CREATININE 22.51* 22.27* 16.93* 17.20* 12.82*  CALCIUM 8.4* 8.8* 9.6  --  9.0  MG  --  3.0*  --   --   --   PHOS  --  9.4*  --   --   --    GFR: Estimated Creatinine Clearance: 6.3 mL/min (A) (by C-G formula based on SCr of 12.82 mg/dL (H)). Liver Function Tests: Recent Labs  Lab 05/29/21 0639 06/02/21 1125  AST 15 17  ALT 9 13  ALKPHOS 100 87  BILITOT 1.4* 1.5*  PROT 8.1 7.6  ALBUMIN 3.5 3.4*   No results for input(s): LIPASE, AMYLASE in the last 168 hours. No results for input(s): AMMONIA in the last 168 hours. Coagulation Profile: No results for input(s): INR, PROTIME in the last 168 hours. Cardiac Enzymes: No results for input(s): CKTOTAL, CKMB, CKMBINDEX, TROPONINI in the last 168 hours. BNP (last 3 results) No results for input(s): PROBNP in the last 8760 hours. HbA1C: No results for input(s): HGBA1C in the last  72 hours. CBG: No results for input(s): GLUCAP in the last 168 hours. Lipid Profile: No results for input(s): CHOL, HDL, LDLCALC, TRIG, CHOLHDL, LDLDIRECT in the last 72 hours. Thyroid Function Tests: No results for input(s): TSH, T4TOTAL, FREET4, T3FREE, THYROIDAB in the last 72 hours. Anemia Panel: No results for input(s): VITAMINB12, FOLATE, FERRITIN, TIBC, IRON, RETICCTPCT in the last 72 hours. Sepsis Labs: No results for input(s): PROCALCITON, LATICACIDVEN in the last 168 hours.  Recent Results (from the past 240 hour(s))  Resp Panel by RT-PCR (Flu A&B, Covid) Nasopharyngeal Swab     Status: None   Collection Time: 05/28/21  7:41 PM  Specimen: Nasopharyngeal Swab; Nasopharyngeal(NP) swabs in vial transport medium  Result Value Ref Range Status   SARS Coronavirus 2 by RT PCR NEGATIVE NEGATIVE Final    Comment: (NOTE) SARS-CoV-2 target nucleic acids are NOT DETECTED.  The SARS-CoV-2 RNA is generally detectable in upper respiratory specimens during the acute phase of infection. The lowest concentration of SARS-CoV-2 viral copies this assay can detect is 138 copies/mL. A negative result does not preclude SARS-Cov-2 infection and should not be used as the sole basis for treatment or other patient management decisions. A negative result may occur with  improper specimen collection/handling, submission of specimen other than nasopharyngeal swab, presence of viral mutation(s) within the areas targeted by this assay, and inadequate number of viral copies(<138 copies/mL). A negative result must be combined with clinical observations, patient history, and epidemiological information. The expected result is Negative.  Fact Sheet for Patients:  EntrepreneurPulse.com.au  Fact Sheet for Healthcare Providers:  IncredibleEmployment.be  This test is no t yet approved or cleared by the [place] FDA and  has been authorized for detection and/or  diagnosis of SARS-CoV-2 by FDA under an Emergency Use Authorization (EUA). This EUA will remain  in effect (meaning this test can be used) for the duration of the COVID-19 declaration under Section 564(b)(1) of the Act, 21 U.S.C.section 360bbb-3(b)(1), unless the authorization is terminated  or revoked sooner.       Influenza A by PCR NEGATIVE NEGATIVE Final   Influenza B by PCR NEGATIVE NEGATIVE Final    Comment: (NOTE) The Xpert Xpress SARS-CoV-2/FLU/RSV plus assay is intended as an aid in the diagnosis of influenza from Nasopharyngeal swab specimens and should not be used as a sole basis for treatment. Nasal washings and aspirates are unacceptable for Xpert Xpress SARS-CoV-2/FLU/RSV testing.  Fact Sheet for Patients: EntrepreneurPulse.com.au  Fact Sheet for Healthcare Providers: IncredibleEmployment.be  This test is not yet approved or cleared by the [place] FDA and has been authorized for detection and/or diagnosis of SARS-CoV-2 by FDA under an Emergency Use Authorization (EUA). This EUA will remain in effect (meaning this test can be used) for the duration of the COVID-19 declaration under Section 564(b)(1) of the Act, 21 U.S.C. section 360bbb-3(b)(1), unless the authorization is terminated or revoked.  Performed at [facility], [address], [place], [place] [address]          Radiology Studies: [facility] Chest [place] 1 View  Result Date: 06/02/2021 CLINICAL DATA:  Shortness of breath. EXAM: PORTABLE CHEST 1 VIEW COMPARISON:  May 03, 2021, chest CT and May 28, 2021, chest x-ray. FINDINGS: Markedly enlarged and globular cardiac silhouette. Mild pulmonary vascular congestion. No pleural effusion or pneumothorax seen. Osseous structures are without acute abnormality. Soft tissues are grossly normal. IMPRESSION: 1. Markedly enlarged and globular cardiac silhouette. The appearance is highly suspicious for large  pericardial effusion. Please further evaluate with cardiac echo. Cardiology consultation is also recommended. 2. Mild pulmonary vascular congestion versus peribronchial airspace consolidation such as seen in atypical pneumonia. Electronically Signed   By: Fidela Salisbury M.D.   On: 06/02/2021 13:09   ECHOCARDIOGRAM LIMITED  Result Date: 06/02/2021    ECHOCARDIOGRAM LIMITED REPORT   Patient Name:   Jordan [name] Date of Exam: 06/02/2021 Medical Rec #:  [id number]        Height:       64.0 in Accession #:    [id number]       Weight:       164.0 lb Date of Birth:  05/06/1966        BSA:          1.798 m Patient Age:    89 years         BP:           144/103 mmHg Patient Gender: M                HR:           103 bpm. Exam Location:  Inpatient Procedure: Limited Echo, Limited Color Doppler and Cardiac Doppler STAT ECHO Indications:    pericardial effusion  History:        Patient has prior history of Echocardiogram examinations, most                 recent 03/02/2021. CHF, COPD and end stage renal disease.; Risk                 Factors:Hypertension.  Sonographer:    Johny Chess RDCS Referring Phys: [place]  1. Left ventricular ejection fraction, by estimation, is 40 to 45%. The left ventricle has mildly decreased function. The left ventricle demonstrates global hypokinesis. There is mild concentric left ventricular hypertrophy. Left ventricular diastolic parameters are consistent with Grade II diastolic dysfunction (pseudonormalization).  2. Right ventricular systolic function is normal. The right ventricular size is normal. There is severely elevated pulmonary artery systolic pressure.  3. Left atrial size was mildly dilated.  4. Right atrial size was mildly dilated.  5. Compared with the echo 02/2021, there is now a small pericardial effusion. The IVC is dilated and there is absent collapse of the IVC, suggesting RA pressure is at least 15 mmHg. However, there are no other signs of  tamponade. Tamponade physiology is unlikely. a small pericardial effusion is present. The pericardial effusion is circumferential. There is no evidence of cardiac tamponade. Large pleural effusion in the left lateral region.  6. The mitral valve is degenerative. Mild to moderate mitral valve regurgitation. No evidence of mitral stenosis.  7. Tricuspid valve regurgitation is moderate.  8. The aortic valve is tricuspid. There is mild calcification of the aortic valve. There is mild thickening of the aortic valve. Aortic valve regurgitation is not visualized. No aortic stenosis is present.  9. The inferior vena cava is dilated in size with <50% respiratory variability, suggesting right atrial pressure of 15 mmHg. FINDINGS  Left Ventricle: Left ventricular ejection fraction, by estimation, is 40 to 45%. The left ventricle has mildly decreased function. The left ventricle demonstrates global hypokinesis. The left ventricular internal cavity size was normal in size. There is  mild concentric left ventricular hypertrophy. Left ventricular diastolic parameters are consistent with Grade II diastolic dysfunction (pseudonormalization). Right Ventricle: The right ventricular size is normal. No increase in right ventricular wall thickness. Right ventricular systolic function is normal. There is severely elevated pulmonary artery systolic pressure. The tricuspid regurgitant velocity is 4.21 m/s, and with an assumed right atrial pressure of 15 mmHg, the estimated right ventricular systolic pressure is 0000000 mmHg. Left Atrium: Left atrial size was mildly dilated. Right Atrium: Right atrial size was mildly dilated. Pericardium: Compared with the echo 02/2021, there is now a small pericardial effusion. The IVC is dilated and there is absent collapse of the IVC, suggesting RA pressure is at least 15 mmHg. However, there are no other signs of tamponade. Tamponade physiology is unlikely. A small pericardial effusion is present. The  pericardial effusion is circumferential. There is no evidence of cardiac  tamponade. Mitral Valve: The mitral valve is degenerative in appearance. Mild to moderate mitral valve regurgitation, with posteriorly-directed jet. No evidence of mitral valve stenosis. Tricuspid Valve: The tricuspid valve is normal in structure. Tricuspid valve regurgitation is moderate . No evidence of tricuspid stenosis. Aortic Valve: The aortic valve is tricuspid. There is mild calcification of the aortic valve. There is mild thickening of the aortic valve. Aortic valve regurgitation is not visualized. No aortic stenosis is present. Pulmonic Valve: The pulmonic valve was normal in structure. Pulmonic valve regurgitation is not visualized. No evidence of pulmonic stenosis. Aorta: The aortic root is normal in size and structure. Venous: The inferior vena cava is dilated in size with less than 50% respiratory variability, suggesting right atrial pressure of 15 mmHg. IAS/Shunts: No atrial level shunt detected by color flow Doppler. Additional Comments: There is a large pleural effusion in the left lateral region. Mild ascites is present. LEFT VENTRICLE PLAX 2D LVIDd:         4.80 cm      Diastology LVIDs:         3.90 cm      LV e' medial:    5.98 cm/s LV PW:         1.30 cm      LV E/e' medial:  24.7 LV IVS:        1.30 cm      LV e' lateral:   8.16 cm/s LVOT diam:     1.90 cm      LV E/e' lateral: 18.1 LV SV:         55 LV SV Index:   30 LVOT Area:     2.84 cm  LV Volumes (MOD) LV vol d, MOD A4C: 129.0 ml LV vol s, MOD A4C: 74.7 ml LV SV MOD A4C:     129.0 ml IVC IVC diam: 2.50 cm LEFT ATRIUM         Index LA diam:    4.70 cm 2.61 cm/m  AORTIC VALVE LVOT Vmax:   123.00 cm/s LVOT Vmean:  85.200 cm/s LVOT VTI:    0.193 m  AORTA Ao Asc diam: 3.10 cm MITRAL VALVE                TRICUSPID VALVE MV Area (PHT): 4.96 cm     TR Peak grad:   70.9 mmHg MV Decel Time: 153 msec     TR Vmax:        421.00 cm/s MV E velocity: 148.00 cm/s MV A velocity:  79.80 cm/s   SHUNTS MV E/A ratio:  1.85         Systemic VTI:  0.19 m                             Systemic Diam: 1.90 cm Skeet Latch MD Electronically signed by Skeet Latch MD Signature Date/Time: 06/02/2021/2:36:10 PM    Final         Scheduled Meds:  sodium chloride   Intravenous Once   calcium acetate  1,334 mg Oral TID WC   Chlorhexidine Gluconate Cloth  6 each Topical Q0600   cinacalcet  30 mg Oral Q breakfast   guaiFENesin  600 mg Oral BID   heparin  5,000 Units Subcutaneous Q8H   hydrALAZINE  50 mg Oral TID   hydrOXYzine  25 mg Oral TID   nicotine  14 mg Transdermal Daily   sodium chloride flush  3  mL Intravenous Q12H   Continuous Infusions:   LOS: 0 days    Time spent: 35 mins.More than 50% of that time was spent in counseling and/or coordination of care.      Shelly Coss, MD [facility] P9/08/2021, 11:07 AM

## 2021-06-03 NOTE — Consult Note (Signed)
CARDIOLOGY CONSULT NOTE       Patient ID: Jordan [name] MRN: [id number] DOB/AGE: 1966/06/09 55 y.o.  Admit date: 06/02/2021 Referring Physician: Tawanna [name] Primary Physician: Pcp, No Primary Cardiologist: Previously Nelson/Jordan Reason for Consultation: Pericardial effusion  Principal Problem:   Hypervolemia associated with renal insufficiency Active Problems:   Tobacco abuse   ESRD on dialysis [facility])   Essential hypertension   Emphysema of lung ([place])   Acute on chronic diastolic CHF (congestive heart failure) (HCC)   HPI:  55 y.o. history of CRF on dialysis ETOH abuse, smoking non compliance and tenuous social situation. Admitted after missing multiple dialysis with volume overload. He lives with brother who works all the time and has transportation issues Denies chest pain. Has had wheezing and dyspnea CXR with some volume overload and large cardiac silhouette Reviewed TTE and he has biventricular failure with EF 45-50% elevated PA pressures moderate RV dysfunction moderate to severe MR and moderate TR. Pericardial effusion is small except laterally where it is moderate there is no tamponade with no RV/RV collapse. He indicates breathing better since having dialysis Anemia worse with Hb 7.7 EGD/coon on hold due to respiratory status. Likely cirrhosis as well from ETOH with CT abdomen 05/10/21 showeing ascites Pericardial effusion is chronic and noted on CT 02/26/21 as well   ROS All other systems reviewed and negative except as noted above  Past Medical History:  Diagnosis Date   Anemia    Aortic atherosclerosis ([place]) 11/12/2019   ED (erectile dysfunction)    Emphysema of lung ([place]) 11/12/2019   ESRD on hemodialysis ([place])    M/W/F in [place], [place]   ETOH abuse    History of blood transfusion    Hypertension    Wears dentures     Family History  Problem Relation Age of Onset   Diabetes Mother    Kidney failure Mother    Healthy Father    Kidney failure Brother     Healthy Sister    Kidney disease Daughter    Post-traumatic stress disorder Neg Hx    Bladder Cancer Neg Hx    Kidney cancer Neg Hx     Social History   Socioeconomic History   Marital status: Single    Spouse name: Not on file   Number of children: Not on file   Years of education: Not on file   Highest education level: Not on file  Occupational History   Occupation: disabled  Tobacco Use   Smoking status: Every Day    Packs/day: 0.50    Years: 40.00    Pack years: 20.00    Types: Cigarettes   Smokeless tobacco: Never  Vaping Use   Vaping Use: Never used  Substance and Sexual Activity   Alcohol use: Not Currently    Alcohol/week: 21.0 standard drinks    Types: 21 Cans of beer per week    Comment: last drink 6 months ago   Drug use: No   Sexual activity: Not Currently  Other Topics Concern   Not on file  Social History Narrative   Not on file   Social Determinants of Health   Financial Resource Strain: Not on file  Food Insecurity: Not on file  Transportation Needs: Not on file  Physical Activity: Not on file  Stress: Not on file  Social Connections: Not on file  Intimate Partner Violence: Not on file    Past Surgical History:  Procedure Laterality Date   [place] Left 08/07/2016  Procedure: LEFT BASILIC VEIN TRANSPOSITION;  Surgeon: Angelia Mould, MD;  Location: [facility];  Service: Vascular;  Laterality: Left;   CLOSED REDUCTION NASAL FRACTURE N/A 08/11/2019   Procedure: CLOSED REDUCTION NASAL FRACTURE WITH STABILIZATION;  Surgeon: Irene Limbo, MD;  Location: [facility];  Service: Plastics;  Laterality: N/A;   COLONOSCOPY N/A 08/12/2016   Procedure: COLONOSCOPY;  Surgeon: Otis Brace, MD;  Location: [facility];  Service: Gastroenterology;  Laterality: N/A;   ESOPHAGOGASTRODUODENOSCOPY N/A 08/12/2016   Procedure: ESOPHAGOGASTRODUODENOSCOPY (EGD);  Surgeon: Otis Brace, MD;  Location: [facility];  Service:  Gastroenterology;  Laterality: N/A;   INSERTION OF DIALYSIS CATHETER N/A 08/07/2016   Procedure: INSERTION OF TUNNELED DIALYSIS CATHETER;  Surgeon: Angelia Mould, MD;  Location: [facility];  Service: Vascular;  Laterality: N/A;   LIGATION OF ARTERIOVENOUS  FISTULA Left 09/12/2016   Procedure: BANDING OF LEFT  ARTERIOVENOUS  FISTULA;  Surgeon: Angelia Mould, MD;  Location: [facility];  Service: Vascular;  Laterality: Left;   LOWER EXTREMITY INTERVENTION Right 12/02/2018   Procedure: LOWER EXTREMITY INTERVENTION;  Surgeon: Algernon Huxley, MD;  Location: [facility];  Service: Cardiovascular;  Laterality: Right;      Current Facility-Administered Medications:    acetaminophen (TYLENOL) tablet 650 mg, 650 mg, Oral, Q6H PRN, 650 mg at 06/03/21 0521 **OR** acetaminophen (TYLENOL) suppository 650 mg, 650 mg, Rectal, Q6H PRN, Karmen Bongo, MD   albuterol (PROVENTIL) (2.5 MG/3ML) 0.083% nebulizer solution 3 mL, 3 mL, Inhalation, Q4H PRN, Karmen Bongo, MD   calcium acetate (PHOSLO) capsule 1,334 mg, 1,334 mg, Oral, TID WC, Karmen Bongo, MD, 1,334 mg at 06/03/21 1223   calcium carbonate (dosed in mg elemental calcium) suspension 500 mg of elemental calcium, 500 mg of elemental calcium, Oral, Q6H PRN, Karmen Bongo, MD   camphor-menthol [facility]) lotion 1 application, 1 application, Topical, [id number] PRN **AND** hydrOXYzine (ATARAX/VISTARIL) tablet 25 mg, 25 mg, Oral, Q8H PRN, Karmen Bongo, MD, 25 mg at 06/03/21 0311   Chlorhexidine Gluconate Cloth 2 % PADS 6 each, 6 each, Topical, Q0600, Penninger, Lindsay, PA, 6 each at 06/03/21 0522   cinacalcet (SENSIPAR) tablet 30 mg, 30 mg, Oral, Q breakfast, Karmen Bongo, MD, 30 mg at 06/03/21 0900   docusate sodium (ENEMEEZ) enema 283 mg, 1 enema, Rectal, PRN, Karmen Bongo, MD   feeding supplement (NEPRO CARB STEADY) liquid 237 mL, 237 mL, Oral, TID PRN, Karmen Bongo, MD   guaiFENesin (MUCINEX) 12 hr tablet 600 mg, 600 mg, Oral, BID,  Karmen Bongo, MD, 600 mg at 06/03/21 0859   hydrALAZINE (APRESOLINE) injection 5 mg, 5 mg, Intravenous, Q4H PRN, Karmen Bongo, MD   hydrALAZINE (APRESOLINE) tablet 50 mg, 50 mg, Oral, TID, Karmen Bongo, MD, 50 mg at 06/03/21 0859   hydrOXYzine (ATARAX/VISTARIL) tablet 25 mg, 25 mg, Oral, TID, Karmen Bongo, MD, 25 mg at 06/03/21 0859   nicotine (NICODERM CQ - dosed in mg/24 hours) patch 14 mg, 14 mg, Transdermal, Daily, Karmen Bongo, MD, 14 mg at 06/03/21 0900   ondansetron (ZOFRAN) tablet 4 mg, 4 mg, Oral, Q6H PRN **OR** ondansetron (ZOFRAN) injection 4 mg, 4 mg, Intravenous, Q6H PRN, Karmen Bongo, MD   pantoprazole (PROTONIX) injection 40 mg, 40 mg, Intravenous, Q12H, Adhikari, Amrit, MD, 40 mg at 06/03/21 1223   sodium chloride flush (NS) 0.9 % injection 3 mL, 3 mL, Intravenous, Q12H, Karmen Bongo, MD, 3 mL at 06/03/21 0900   sorbitol 70 % solution 30 mL, 30 mL, Oral, PRN, Karmen Bongo, MD   zolpidem Lorrin Mais)  tablet 5 mg, 5 mg, Oral, QHS PRN, Karmen Bongo, MD  calcium acetate  1,334 mg Oral TID WC   Chlorhexidine Gluconate Cloth  6 each Topical Q0600   cinacalcet  30 mg Oral Q breakfast   guaiFENesin  600 mg Oral BID   hydrALAZINE  50 mg Oral TID   hydrOXYzine  25 mg Oral TID   nicotine  14 mg Transdermal Daily   pantoprazole (PROTONIX) IV  40 mg Intravenous Q12H   sodium chloride flush  3 mL Intravenous Q12H     Physical Exam: Blood pressure (!) 124/53, pulse 93, temperature 98.2 F (36.8 C), temperature source Oral, resp. rate 18, height '5\' 4"'$  (1.626 m), weight 77.7 kg, SpO2 94 %.    Chronically ill black male JVP elevated Wheezing  No murmur  Large fistula in LUE No edema BS positive with fluid wave   Labs:   Lab Results  Component Value Date   WBC 8.4 06/03/2021   HGB 6.7 (LL) 06/03/2021   HCT 21.5 (L) 06/03/2021   MCV 99.5 06/03/2021   PLT 96 (L) 06/03/2021    Recent Labs  Lab 06/02/21 1125 06/02/21 1152 06/03/21 0624  NA 138   < >  142  K 5.1   < > 3.8  CL 99   < > 102  CO2 18*  --  25  BUN 121*   < > 86*  CREATININE 16.93*   < > 12.82*  CALCIUM 9.6  --  9.0  PROT 7.6  --   --   BILITOT 1.5*  --   --   ALKPHOS 87  --   --   ALT 13  --   --   AST 17  --   --   GLUCOSE 72   < > 99   < > = values in this interval not displayed.   Lab Results  Component Value Date   TROPONINI 0.08 [facility]) 02/18/2019    Lab Results  Component Value Date   CHOL 145 05/17/2018   Lab Results  Component Value Date   HDL 34 (L) 05/17/2018   Lab Results  Component Value Date   LDLCALC 98 05/17/2018   Lab Results  Component Value Date   TRIG 105 04/29/2021   TRIG 67 05/17/2018   Lab Results  Component Value Date   CHOLHDL 4.3 05/17/2018   No results found for: LDLDIRECT    Radiology: CT Abdomen Pelvis Wo Contrast  Result Date: 05/10/2021 CLINICAL DATA:  Abdominal distension. End-stage renal failure on dialysis. Congestive heart failure. EXAM: CT ABDOMEN AND PELVIS WITHOUT CONTRAST TECHNIQUE: Multidetector CT imaging of the abdomen and pelvis was performed following the standard protocol without IV contrast. COMPARISON:  04/26/2021 FINDINGS: Lower chest: Stable small pericardial effusion. Mild uniform ground-glass opacity in both lung bases is suspicious for mild pulmonary edema. Hepatobiliary: No mass visualized on this unenhanced exam. Gallbladder is unremarkable. No evidence of biliary ductal dilatation. Pancreas: No mass or inflammatory process visualized on this unenhanced exam. Spleen:  Within normal limits in size. Adrenals/Urinary tract: Severe bilateral renal atrophy again noted. Extensive renal vascular calcification again noted. No evidence of urolithiasis or hydronephrosis. Stomach/Bowel: No evidence of obstruction, inflammatory process, or abnormal fluid collections. Normal appendix visualized. Vascular/Lymphatic: No pathologically enlarged lymph nodes identified. No evidence of abdominal aortic aneurysm. Aortic  atherosclerotic calcification noted. Reproductive:  No mass or other significant abnormality. Other: Large amount of ascites shows no significant change. Severe diffuse body wall edema has mildly  increased since prior exam. Musculoskeletal: No suspicious bone lesions identified. Diffuse osteosclerosis again seen, consistent with renal osteodystrophy. IMPRESSION: No acute findings. Anasarca, with large amount of ascites, similar to prior exam. Stable small pericardial effusion. Stable severe bilateral renal atrophy and findings of renal osteodystrophy. No evidence of hydronephrosis. Aortic Atherosclerosis (ICD10-I70.0). Electronically Signed   By: Marlaine Hind M.D.   On: 05/10/2021 18:50   DG Chest 2 View  Result Date: 05/28/2021 CLINICAL DATA:  Shortness of breath. EXAM: CHEST - 2 VIEW COMPARISON:  05/22/2021 FINDINGS: Stable cardiac enlargement. Diffuse increase interstitial markings throughout both lungs compatible with interstitial edema. No pleural effusion identified. No airspace consolidation. IMPRESSION: Cardiac enlargement with diffuse increase interstitial markings compatible with interstitial edema. Electronically Signed   By: Kerby Moors M.D.   On: 05/28/2021 19:00   DG Chest 2 View  Result Date: 05/22/2021 CLINICAL DATA:  Chest pain. EXAM: CHEST - 2 VIEW COMPARISON:  Chest radiograph dated 05/10/2021. FINDINGS: There is cardiomegaly. No focal consolidation, pleural effusion pneumothorax. Atherosclerotic calcification the aorta. No acute osseous pathology. IMPRESSION: 1. No acute cardiopulmonary process. 2. Cardiomegaly. Electronically Signed   By: Anner Crete M.D.   On: 05/22/2021 23:29   DG Chest 2 View  Result Date: 05/10/2021 CLINICAL DATA:  Dyspnea EXAM: CHEST - 2 VIEW COMPARISON:  Radiograph 05/04/2021, chest CT 05/03/2021 FINDINGS: Unchanged, enlarged cardiac silhouette with a globular appearance. Diffuse interstitial and bilateral airspace opacities. No large pleural effusion or  visible pneumothorax. No acute osseous abnormality. Postsurgical changes involving the left upper extremity. IMPRESSION: Unchanged large heart with appearance compatible with pericardial effusion. Diffuse interstitial opacities with bilateral airspace disease compatible with mild pulmonary edema. Electronically Signed   By: Maurine Simmering M.D.   On: 05/10/2021 13:02   DG Chest Port 1 View  Result Date: 06/02/2021 CLINICAL DATA:  Shortness of breath. EXAM: PORTABLE CHEST 1 VIEW COMPARISON:  May 03, 2021, chest CT and May 28, 2021, chest x-ray. FINDINGS: Markedly enlarged and globular cardiac silhouette. Mild pulmonary vascular congestion. No pleural effusion or pneumothorax seen. Osseous structures are without acute abnormality. Soft tissues are grossly normal. IMPRESSION: 1. Markedly enlarged and globular cardiac silhouette. The appearance is highly suspicious for large pericardial effusion. Please further evaluate with cardiac echo. Cardiology consultation is also recommended. 2. Mild pulmonary vascular congestion versus peribronchial airspace consolidation such as seen in atypical pneumonia. Electronically Signed   By: Fidela Salisbury M.D.   On: 06/02/2021 13:09   ECHOCARDIOGRAM LIMITED  Result Date: 06/03/2021    ECHOCARDIOGRAM LIMITED REPORT   Patient Name:   Jordan [name] Date of Exam: 06/03/2021 Medical Rec #:  [id number]        Height:       64.0 in Accession #:    [id number]       Weight:       171.3 lb Date of Birth:  [birth year]        BSA:          1.832 m Patient Age:    47 years         BP:           145/91 mmHg Patient Gender: M                HR:           99 bpm. Exam Location:  Inpatient Procedure: Limited Echo, Cardiac Doppler and Color Doppler STAT ECHO Indications:    I31.3 Pericardial effusion (noninflammatory)  History:  Patient has prior history of Echocardiogram examinations, most                 recent 06/02/2021. Signs/Symptoms:Chest Pain and Dyspnea; Risk                  Factors:Current Smoker. ESRD. Recent covid infection.                 Pericardial effusion. ETOH.  Sonographer:    [place] Referring Phys: [id number] AMRIT ADHIKARI IMPRESSIONS  1. Limited echo for pericardial effusion  2. Left ventricular ejection fraction, by estimation, is 45 to 50%. The left ventricle has mildly decreased function. The left ventricle demonstrates global hypokinesis. There is the interventricular septum is flattened in systole and diastole, consistent with right ventricular pressure and volume overload.  3. Pericardial effusion measures up to 1.5 cm. Moderate pericardial effusion. The pericardial effusion is circumferential. There is no evidence of cardiac tamponade.  4. The mitral valve is degenerative. Moderate to severe mitral valve regurgitation.  5. The tricuspid valve is degenerative. Tricuspid valve regurgitation is moderate.  6. The aortic valve is tricuspid.  7. Right ventricular systolic function is normal. The right ventricular size is normal. There is severely elevated pulmonary artery systolic pressure. The estimated right ventricular systolic pressure is [id number] mmHg.  8. The inferior vena cava is dilated in size with <50% respiratory variability, suggesting right atrial pressure of 15 mmHg.  9. Left atrial size was moderately dilated. 10. Right atrial size was mildly dilated. Comparison(s): Changes from prior study are noted. 06/03/2021: LVEF 40-45%, small pericardial effusion, no tamponade. The pericardial effusion appears larger today then by echo yesterday. Cannot exclude clinical tamponade given degree of pulmonary hypertension. Clinical correlation is advised. Conclusion(s)/Recommendation(s): Due to the above findings, cardiology consult is recommended. Critical findings reported to Dr. Veverly Fells and acknowledged at 2:23 pm. FINDINGS  Left Ventricle: Left ventricular ejection fraction, by estimation, is 45 to 50%. The left ventricle has mildly decreased function. The left  ventricle demonstrates global hypokinesis. The interventricular septum is flattened in systole and diastole, consistent with right ventricular pressure and volume overload. Right Ventricle: The right ventricular size is normal. No increase in right ventricular wall thickness. Right ventricular systolic function is normal. There is severely elevated pulmonary artery systolic pressure. The tricuspid regurgitant velocity is 3.48 m/s, and with an assumed right atrial pressure of 15 mmHg, the estimated right ventricular systolic pressure is [id number] mmHg. Left Atrium: Left atrial size was moderately dilated. Right Atrium: Right atrial size was mildly dilated. Pericardium: Pericardial effusion measures up to 1.5 cm. A moderately sized pericardial effusion is present. The pericardial effusion is circumferential. There is no evidence of cardiac tamponade. Mitral Valve: The mitral valve is degenerative in appearance. Moderate to severe mitral valve regurgitation, with wall-impinging jet. Tricuspid Valve: The tricuspid valve is degenerative in appearance. Tricuspid valve regurgitation is moderate. Aortic Valve: The aortic valve is tricuspid. Venous: The inferior vena cava is dilated in size with less than 50% respiratory variability, suggesting right atrial pressure of 15 mmHg. IAS/Shunts: No atrial level shunt detected by color flow Doppler.  LV Volumes (MOD) LV vol d, MOD A2C: 112.0 ml LV vol d, MOD A4C: 114.0 ml LV vol s, MOD A2C: 69.9 ml LV vol s, MOD A4C: 54.9 ml LV SV MOD A2C:     42.1 ml LV SV MOD A4C:     114.0 ml LV SV MOD BP:      50.1 ml IVC IVC diam:  2.80 cm TRICUSPID VALVE TR Peak grad:   48.4 mmHg TR Vmax:        348.00 cm/s Lyman Bishop MD Electronically signed by Lyman Bishop MD Signature Date/Time: 06/03/2021/2:32:48 PM    Final    ECHOCARDIOGRAM LIMITED  Result Date: 06/02/2021    ECHOCARDIOGRAM LIMITED REPORT   Patient Name:   Jordan [name] Date of Exam: 06/02/2021 Medical Rec #:  [id number]         Height:       64.0 in Accession #:    [id number]       Weight:       164.0 lb Date of Birth:  [birth year]        BSA:          1.798 m Patient Age:    33 years         BP:           144/103 mmHg Patient Gender: M                HR:           103 bpm. Exam Location:  Inpatient Procedure: Limited Echo, Limited Color Doppler and Cardiac Doppler STAT ECHO Indications:    pericardial effusion  History:        Patient has prior history of Echocardiogram examinations, most                 recent 03/02/2021. CHF, COPD and end stage renal disease.; Risk                 Factors:Hypertension.  Sonographer:    Johny Chess RDCS Referring Phys: [place]  1. Left ventricular ejection fraction, by estimation, is 40 to 45%. The left ventricle has mildly decreased function. The left ventricle demonstrates global hypokinesis. There is mild concentric left ventricular hypertrophy. Left ventricular diastolic parameters are consistent with Grade II diastolic dysfunction (pseudonormalization).  2. Right ventricular systolic function is normal. The right ventricular size is normal. There is severely elevated pulmonary artery systolic pressure.  3. Left atrial size was mildly dilated.  4. Right atrial size was mildly dilated.  5. Compared with the echo 02/2021, there is now a small pericardial effusion. The IVC is dilated and there is absent collapse of the IVC, suggesting RA pressure is at least 15 mmHg. However, there are no other signs of tamponade. Tamponade physiology is unlikely. a small pericardial effusion is present. The pericardial effusion is circumferential. There is no evidence of cardiac tamponade. Large pleural effusion in the left lateral region.  6. The mitral valve is degenerative. Mild to moderate mitral valve regurgitation. No evidence of mitral stenosis.  7. Tricuspid valve regurgitation is moderate.  8. The aortic valve is tricuspid. There is mild calcification of the aortic valve. There is  mild thickening of the aortic valve. Aortic valve regurgitation is not visualized. No aortic stenosis is present.  9. The inferior vena cava is dilated in size with <50% respiratory variability, suggesting right atrial pressure of 15 mmHg. FINDINGS  Left Ventricle: Left ventricular ejection fraction, by estimation, is 40 to 45%. The left ventricle has mildly decreased function. The left ventricle demonstrates global hypokinesis. The left ventricular internal cavity size was normal in size. There is  mild concentric left ventricular hypertrophy. Left ventricular diastolic parameters are consistent with Grade II diastolic dysfunction (pseudonormalization). Right Ventricle: The right ventricular size is normal. No increase in right ventricular wall thickness. Right ventricular systolic function is  normal. There is severely elevated pulmonary artery systolic pressure. The tricuspid regurgitant velocity is 4.21 m/s, and with an assumed right atrial pressure of 15 mmHg, the estimated right ventricular systolic pressure is 0000000 mmHg. Left Atrium: Left atrial size was mildly dilated. Right Atrium: Right atrial size was mildly dilated. Pericardium: Compared with the echo 02/2021, there is now a small pericardial effusion. The IVC is dilated and there is absent collapse of the IVC, suggesting RA pressure is at least 15 mmHg. However, there are no other signs of tamponade. Tamponade physiology is unlikely. A small pericardial effusion is present. The pericardial effusion is circumferential. There is no evidence of cardiac tamponade. Mitral Valve: The mitral valve is degenerative in appearance. Mild to moderate mitral valve regurgitation, with posteriorly-directed jet. No evidence of mitral valve stenosis. Tricuspid Valve: The tricuspid valve is normal in structure. Tricuspid valve regurgitation is moderate . No evidence of tricuspid stenosis. Aortic Valve: The aortic valve is tricuspid. There is mild calcification of the aortic  valve. There is mild thickening of the aortic valve. Aortic valve regurgitation is not visualized. No aortic stenosis is present. Pulmonic Valve: The pulmonic valve was normal in structure. Pulmonic valve regurgitation is not visualized. No evidence of pulmonic stenosis. Aorta: The aortic root is normal in size and structure. Venous: The inferior vena cava is dilated in size with less than 50% respiratory variability, suggesting right atrial pressure of 15 mmHg. IAS/Shunts: No atrial level shunt detected by color flow Doppler. Additional Comments: There is a large pleural effusion in the left lateral region. Mild ascites is present. LEFT VENTRICLE PLAX 2D LVIDd:         4.80 cm      Diastology LVIDs:         3.90 cm      LV e' medial:    5.98 cm/s LV PW:         1.30 cm      LV E/e' medial:  24.7 LV IVS:        1.30 cm      LV e' lateral:   8.16 cm/s LVOT diam:     1.90 cm      LV E/e' lateral: 18.1 LV SV:         55 LV SV Index:   30 LVOT Area:     2.84 cm  LV Volumes (MOD) LV vol d, MOD A4C: 129.0 ml LV vol s, MOD A4C: 74.7 ml LV SV MOD A4C:     129.0 ml IVC IVC diam: 2.50 cm LEFT ATRIUM         Index LA diam:    4.70 cm 2.61 cm/m  AORTIC VALVE LVOT Vmax:   123.00 cm/s LVOT Vmean:  85.200 cm/s LVOT VTI:    0.193 m  AORTA Ao Asc diam: 3.10 cm MITRAL VALVE                TRICUSPID VALVE MV Area (PHT): 4.96 cm     TR Peak grad:   70.9 mmHg MV Decel Time: 153 msec     TR Vmax:        421.00 cm/s MV E velocity: 148.00 cm/s MV A velocity: 79.80 cm/s   SHUNTS MV E/A ratio:  1.85         Systemic VTI:  0.19 m                             Systemic  Diam: 1.90 cm Skeet Latch MD Electronically signed by Skeet Latch MD Signature Date/Time: 06/02/2021/2:36:10 PM    Final     EKG: Sinus tachycardia no acute changes    ASSESSMENT AND PLAN:   Pericardial effusion :  related to insufficient dialysis and non compliance no tamponade moderate LV lateral. Continue with dialysis no indication for  pericardiocentesis CHF:  chronic biventricular failure smoking/ETOH non compliance fluid management with dialysis Previous CTA no PE  Not a candidate for cath advanced Rx's given non compliance Anemia: ? Bleed ? Varices with cirrhosis and renal failure GI has seen and deferred EGD/Colon until respiratory status stable  COPD: active wheezing continue albuterol d/c smokes nicoderm patch CRF:  large LUE fistula has transportation issues as outpatient Social service consult to see if this can be remedied   Signed: Jenkins Rouge 06/03/2021, 3:29 PM

## 2021-06-03 NOTE — Care Management Obs Status (Signed)
[facility] NOTIFICATION   Patient Details  Name: Jordan [name] MRN: [id number] Date of Birth: 11-13-1965   Medicare Observation Status Notification Given:  Yes    Tom-[name], Renea Ee, RN 06/03/2021, 2:52 PM

## 2021-06-03 NOTE — Consult Note (Addendum)
[facility] Consult: 11:15 AM 06/03/2021  LOS: 0 days    Referring Provider: Dr [name]  Primary Care Physician:  Pcp, No Primary Gastroenterologist:  Dr. Bonna Gains in [place].       Reason for Consultation:  Anemia in pt w ESRD   HPI: Jordan [name] is a 55 y.o. male.  PMH ESRD.  On HD MWF, frequently non-compliant.  Emphysema/COPD, still smokes.   Anemia.  Previous blood transfusion.  Heart failure, 06/02/2021 echo: LVEF 40 to [id number], grade 2 diastolic dysfunction.  Small pericardial effusion.  Atrial fibrillation.  Hypertension.  Alcohol abuse.  Incidental COVID positive in early August 2022 treated with monoclonal antibody.  Ascites, anasarca dating to at least fall 2021.    07/2016 EGD.  By Dr. Arvid Right.  For IDA, FOBT +.  Regular Z-line.  Mild gastritis, biopsied.  Normal duodenum to D2.  Path: Chronic focally active gastritis without H. pylori. 07/2016 colonoscopy.  For IDA, FOBT positive.  Poor prep.  Nonthrombosed, nonbleeding external/external hemorrhoids.  Small amount liquid stool throughout the colon interfered with visualization.  Biopsies obtained of congested, erythematous mucosa at hepatic flexure (path: mildly active colitis with ulceration, no dysplasia or malignancy.  Increased intraepithelial neutrophils with surface ulceration but no changes of idiopathic IBD.  Differential includes infectious process.)  8 mm polyp removed from distal sigmoid (TA without HGD).  Seen inpt by Dr Bonna Gains 09/20/2020 re transfusion requiring anemia, Hgb 6.7 >> 8.6 after PRBC.  FOBT not assayed.  MCV 106.  Anemia of chronic kidney dz suspected and GI rec was Hematology consult.  No immediate endoscopy given pt not iron deficient.  However outpt, polyp surveillance colonoscopy suggested.  Has not been back to GI for  outpt visits nor yet seen by hematology    Stool FOBT + on 04/26/2021.    04/26/2021 CTAP w contrast: Interval increase in both large volume ascites and anasarca.  Distended hepatic veins, no parenchymal liver disease. 05/10/2021.  CTAP w/O contrast: Abdominal distention.  This showed large ascites, anasarca, small pericardial effusion.  On noncontrast study no mass visualized in the liver.  Spleen normal.  Renal atrophy/renal osteodystrophy.  Frequent admissions.  Currently 9th [facility] admission for 2022.  Diagnoses include pneumonia, volume overload in setting of missed dialysis (multiple incidences), signs out AMA on occasion.   Admitted 9/6 - 05/29/2021 w volume overload, missed hemodialysis, hyperkalemia.  Signed out AMA prior to receiving hemodialysis.  Last HD was 9/5. Admitted once again yesterday with ongoing volume overload, dyspnea. Underwent urgent HD session ending 1 AM today.  Received PRBCs on multiple occasions in 2021, 03/2021, 05/2021  Baseline Hgb in the low to upper 7 range.  Was 8.3 on 9/7 >> 7.7 yesterday >> 6.7 this AM. MCV 99.   Iron, TIBC, iron sats normal.  Ferritin 688.  Folate and B12 normal. Thrombocytopenia with platelets in the 90s to 150.  Currently 96.    Stool FOBT positive.    T bili to 1.5, O/w normal.   CXR: Suspicious for large pericardial effusion, recommend echo.  Mild  pulmonary vascular congestion vs peribronchial airspace consolidation as seen in atypical pneumonia. 06/02/2021 2D Echo: LVEF 40 to [id number], grade 2 diastolic dysfunction.  Small peri-cardial effusion.   No signif valvular dz or regurge.  Large L pleural effusion.    GI wise patient normally has brown stools daily but has had dark, formed stool twice in the last couple of days.  Abdomen feels bloated but not painful.  No nausea, vomiting, anorexia, dysphagia, bleeding per rectum.  No excessive or unusual bruising or bleeding.  Not using NSAIDs or ASA products.  Renal note confirms that he receives  Venofer weekly and Mircera every 2 weeks.  Shortness of breath improved but unclear how much improved as he has not been out of bed today.  Oxygen sats on 2 L nasal cannula currently mid to upper 90%.   Tacky at times into the low 100s.  BPs with diastolic elevation.  Says no ETOH x 2 to 3 months.  Previously 6 pack beer daily.       Past Medical History:  Diagnosis Date   Anemia    Aortic atherosclerosis ([place]) 11/12/2019   ED (erectile dysfunction)    Emphysema of lung ([place]) 11/12/2019   ESRD on hemodialysis ([place])    M/W/F in [place], [place]   ETOH abuse    History of blood transfusion    Hypertension    Wears dentures     Past Surgical History:  Procedure Laterality Date   BASCILIC VEIN TRANSPOSITION Left 08/07/2016   Procedure: LEFT BASILIC VEIN TRANSPOSITION;  Surgeon: Angelia Mould, MD;  Location: [facility];  Service: Vascular;  Laterality: Left;   CLOSED REDUCTION NASAL FRACTURE N/A 08/11/2019   Procedure: CLOSED REDUCTION NASAL FRACTURE WITH STABILIZATION;  Surgeon: Irene Limbo, MD;  Location: [facility];  Service: Plastics;  Laterality: N/A;   COLONOSCOPY N/A 08/12/2016   Procedure: COLONOSCOPY;  Surgeon: Otis Brace, MD;  Location: [facility];  Service: Gastroenterology;  Laterality: N/A;   ESOPHAGOGASTRODUODENOSCOPY N/A 08/12/2016   Procedure: ESOPHAGOGASTRODUODENOSCOPY (EGD);  Surgeon: Otis Brace, MD;  Location: [facility];  Service: Gastroenterology;  Laterality: N/A;   INSERTION OF DIALYSIS CATHETER N/A 08/07/2016   Procedure: INSERTION OF TUNNELED DIALYSIS CATHETER;  Surgeon: Angelia Mould, MD;  Location: [facility];  Service: Vascular;  Laterality: N/A;   LIGATION OF ARTERIOVENOUS  FISTULA Left 09/12/2016   Procedure: BANDING OF LEFT  ARTERIOVENOUS  FISTULA;  Surgeon: Angelia Mould, MD;  Location: [facility];  Service: Vascular;  Laterality: Left;   LOWER EXTREMITY INTERVENTION Right 12/02/2018   Procedure: LOWER EXTREMITY INTERVENTION;   Surgeon: Algernon Huxley, MD;  Location: [facility];  Service: Cardiovascular;  Laterality: Right;    Prior to Admission medications   Medication Sig Start Date End Date Taking? Authorizing Provider  albuterol (VENTOLIN HFA) 108 (90 Base) MCG/ACT inhaler Inhale 2 puffs into the lungs every 4 (four) hours as needed for wheezing or shortness of breath. 05/04/21   Aline August, MD  calcium acetate (PHOSLO) 667 MG capsule Take 2 capsules (1,334 mg total) by mouth 3 (three) times daily with meals. 05/23/21   Naaman Plummer, MD  cinacalcet (SENSIPAR) 30 MG tablet Take 1 tablet (30 mg total) by mouth daily with breakfast. 05/04/21   Aline August, MD  epoetin alfa (EPOGEN) 4000 UNIT/ML injection Inject 1 mL (4,000 Units total) into the vein every Monday, Wednesday, and Friday with hemodialysis. 06/22/20   Enzo Bi, MD  guaiFENesin (MUCINEX) 600 MG 12 hr tablet Take  1 tablet (600 mg total) by mouth 2 (two) times daily. 05/04/21   Aline August, MD  hydrALAZINE (APRESOLINE) 50 MG tablet Take 50 mg by mouth 3 (three) times daily.    [provider]  hydrOXYzine (ATARAX/VISTARIL) 25 MG tablet Take 1 tablet (25 mg total) by mouth 3 (three) times daily. 05/04/21   Aline August, MD  Lidocaine 3 % CREA Apply 1 application topically 2 (two) times daily as needed (pain). 02/26/21   Cuthriell, Charline Bills, PA-C  Lidocaine 3 % CREA Apply 1 application topically 3 (three) times daily as needed (itching). 05/23/21   Naaman Plummer, MD  thiamine 100 MG tablet Take 1 tablet (100 mg total) by mouth daily. 03/11/21   Lorella Nimrod, MD  triamcinolone cream (KENALOG) 0.1 % Apply 1 application topically 2 (two) times daily. 05/23/21   Naaman Plummer, MD    Scheduled Meds:  sodium chloride   Intravenous Once   calcium acetate  1,334 mg Oral TID WC   Chlorhexidine Gluconate Cloth  6 each Topical Q0600   cinacalcet  30 mg Oral Q breakfast   guaiFENesin  600 mg Oral BID   hydrALAZINE  50 mg Oral TID    hydrOXYzine  25 mg Oral TID   nicotine  14 mg Transdermal Daily   pantoprazole (PROTONIX) IV  40 mg Intravenous Q12H   sodium chloride flush  3 mL Intravenous Q12H   Infusions:  PRN Meds: acetaminophen **OR** acetaminophen, albuterol, calcium carbonate (dosed in mg elemental calcium), camphor-menthol **AND** hydrOXYzine, docusate sodium, feeding supplement (NEPRO CARB STEADY), hydrALAZINE, ondansetron **OR** ondansetron (ZOFRAN) IV, sorbitol, zolpidem   Allergies as of 06/02/2021 - Review Complete 06/02/2021  Allergen Reaction Noted   Lisinopril Swelling 01/15/2021    Family History  Problem Relation Age of Onset   Diabetes Mother    Kidney failure Mother    Healthy Father    Kidney failure Brother    Healthy Sister    Kidney disease Daughter    Post-traumatic stress disorder Neg Hx    Bladder Cancer Neg Hx    Kidney cancer Neg Hx     Social History   Socioeconomic History   Marital status: Single    Spouse name: Not on file   Number of children: Not on file   Years of education: Not on file   Highest education level: Not on file  Occupational History   Occupation: disabled  Tobacco Use   Smoking status: Every Day    Packs/day: 0.50    Years: 40.00    Pack years: 20.00    Types: Cigarettes   Smokeless tobacco: Never  Vaping Use   Vaping Use: Never used  Substance and Sexual Activity   Alcohol use: Not Currently    Alcohol/week: 21.0 standard drinks    Types: 21 Cans of beer per week    Comment: last drink 6 months ago   Drug use: No   Sexual activity: Not Currently  Other Topics Concern   Not on file  Social History Narrative   Not on file   Social Determinants of Health   Financial Resource Strain: Not on file  Food Insecurity: Not on file  Transportation Needs: Not on file  Physical Activity: Not on file  Stress: Not on file  Social Connections: Not on file  Intimate Partner Violence: Not on file    REVIEW OF SYSTEMS: Constitutional: Reports  fatigue and sleeping more than usual. ENT:  No nose bleeds Pulm: Dyspnea improved after  dialysis last night. CV:  No palpitations, no angina. + For LE edema. GU:  No hematuria, no frequency GI: See HPI. Heme: Denies excessive or unusual bleeding or bruising. Transfusions: See HPI. Neuro:  No headaches, no peripheral tingling or numbness.  No syncope, no seizures. Derm:  + For pruritus. Endocrine:  No sweats or chills.  No polyuria or dysuria Immunization: Reviewed.   PHYSICAL EXAM: Vital signs in last 24 hours: Vitals:   06/03/21 0157 06/03/21 0505  BP:  (!) 145/91  Pulse:  (!) 105  Resp:  18  Temp: (P) 98.8 F (37.1 C) 97.6 F (36.4 C)  SpO2:  100%   Wt Readings from Last 3 Encounters:  06/02/21 77.7 kg  05/29/21 77.9 kg  05/11/21 73 kg    General: Patient looks chronically unwell, old for stated age but is resting comfortably on the bed. Head: Some facial swelling.  No signs of head trauma.  No facial asymmetry. Eyes: Conjunctiva pink.  No scleral icterus. Ears: Slight HOH. Nose: No congestion or discharge Mouth: Oropharynx moist, pink, clear.  Tongue midline. Neck: No JVD Lungs: Wheezing, diminished breath sounds, crackles throughout.  No labored breathing at rest.  Occasional dry cough. Heart: RRR.  No MRG.  S1, S2 present. Abdomen: Large.  Not tender.  No HSM, masses, bruits, hernias appreciated..   Rectal: Deferred. Musc/Skeltl: No joint redness, swelling or gross deformity. Extremities: Slight, 1+, pitting edema at feet. LUE AV fistula w thrill. Neurologic: Oriented x3.  Alert.  Moves all 4 limbs.  No tremors.  No gross weakness or deficits. Skin: No rash.  There is a shallow, nonbleeding ulcer covered with a bandage in region of right jaw Nodes: No cervical adenopathy Psych: Cooperative.  Rapid speech is hard to understand.  Intake/Output from previous day: 09/11 0701 - 09/12 0700 In: -  Out: 4000  Intake/Output this shift: No intake/output data  recorded.  LAB RESULTS: Recent Labs    06/02/21 1125 06/02/21 1152 06/03/21 0624  WBC 9.2  --  8.4  HGB 7.7* 8.5* 6.7*  HCT 24.9* 25.0* 21.5*  PLT 106*  --  96*   BMET Lab Results  Component Value Date   NA 142 06/03/2021   NA 138 06/02/2021   NA 138 06/02/2021   K 3.8 06/03/2021   K 4.9 06/02/2021   K 5.1 06/02/2021   CL 102 06/03/2021   CL 103 06/02/2021   CL 99 06/02/2021   CO2 25 06/03/2021   CO2 18 (L) 06/02/2021   CO2 18 (L) 05/29/2021   GLUCOSE 99 06/03/2021   GLUCOSE 70 06/02/2021   GLUCOSE 72 06/02/2021   BUN 86 (H) 06/03/2021   BUN >130 (H) 06/02/2021   BUN 121 (H) 06/02/2021   CREATININE 12.82 (H) 06/03/2021   CREATININE 17.20 (H) 06/02/2021   CREATININE 16.93 (H) 06/02/2021   CALCIUM 9.0 06/03/2021   CALCIUM 9.6 06/02/2021   CALCIUM 8.8 (L) 05/29/2021   LFT Recent Labs    06/02/21 1125  PROT 7.6  ALBUMIN 3.4*  AST 17  ALT 13  ALKPHOS 87  BILITOT 1.5*   PT/INR Lab Results  Component Value Date   INR 1.4 (H) 03/21/2020   INR 1.4 (H) 10/05/2019   INR 1.0 07/22/2019   Hepatitis Panel No results for input(s): HEPBSAG, HCVAB, HEPAIGM, HEPBIGM in the last 72 hours. C-Diff No components found for: CDIFF Lipase     Component Value Date/Time   LIPASE 45 01/13/2021 0002    Drugs of Abuse  No results found for: LABOPIA, COCAINSCRNUR, LABBENZ, AMPHETMU, THCU, LABBARB   RADIOLOGY STUDIES: DG Chest Port 1 View  Result Date: 06/02/2021 CLINICAL DATA:  Shortness of breath. EXAM: PORTABLE CHEST 1 VIEW COMPARISON:  May 03, 2021, chest CT and May 28, 2021, chest x-ray. FINDINGS: Markedly enlarged and globular cardiac silhouette. Mild pulmonary vascular congestion. No pleural effusion or pneumothorax seen. Osseous structures are without acute abnormality. Soft tissues are grossly normal. IMPRESSION: 1. Markedly enlarged and globular cardiac silhouette. The appearance is highly suspicious for large pericardial effusion. Please further evaluate  with cardiac echo. Cardiology consultation is also recommended. 2. Mild pulmonary vascular congestion versus peribronchial airspace consolidation such as seen in atypical pneumonia. Electronically Signed   By: Fidela Salisbury M.D.   On: 06/02/2021 13:09   ECHOCARDIOGRAM LIMITED  Result Date: 06/02/2021  IMPRESSIONS  1. Left ventricular ejection fraction, by estimation, is 40 to 45%. The left ventricle has mildly decreased function. The left ventricle demonstrates global hypokinesis. There is mild concentric left ventricular hypertrophy. Left ventricular diastolic parameters are consistent with Grade II diastolic dysfunction (pseudonormalization).  2. Right ventricular systolic function is normal. The right ventricular size is normal. There is severely elevated pulmonary artery systolic pressure.  3. Left atrial size was mildly dilated.  4. Right atrial size was mildly dilated.  5. Compared with the echo 02/2021, there is now a small pericardial effusion. The IVC is dilated and there is absent collapse of the IVC, suggesting RA pressure is at least 15 mmHg. However, there are no other signs of tamponade. Tamponade physiology is unlikely. a small pericardial effusion is present. The pericardial effusion is circumferential. There is no evidence of cardiac tamponade. Large pleural effusion in the left lateral region.  6. The mitral valve is degenerative. Mild to moderate mitral valve regurgitation. No evidence of mitral stenosis.  7. Tricuspid valve regurgitation is moderate.  8. The aortic valve is tricuspid. There is mild calcification of the aortic valve. There is mild thickening of the aortic valve. Aortic valve regurgitation is not visualized. No aortic stenosis is present.  9. The inferior vena cava is dilated in size with <50% respiratory variability, suggesting right atrial pressure of 15 mmHg. Electronically signed by Skeet Latch MD Signature Date/Time: 06/02/2021/2:36:10 PM    Final        IMPRESSION:     Acute on chronic anemia.  MCV normal.   Dark Stool FOBT positive.  Iron, TIBC, iron sats normal.  Ferritin 688.  Folate and B12 normal.  Renal administers q 2 week Mircera and weekly Venofer.  However, w his non attendence at HD, no Mircera since 8/5.           Adenomatous colon polyp 2017.  Mild H. pylori negative gastritis 2017.  Due repeat     surveillance colonoscopy in 07/2021.        ESRD.  Missed HD sessions resulting in volume overload, uremia.  Emergent HD overnight.       Mixed CHF.  Small pericardial effusion, large left pleural effusion.      Thrombocytopenia.       PLAN:       Colonoscopy/EGD once respiratory status improves. Also any plans for L thoracentesis?     Azucena Freed  06/03/2021, 11:15 AM Phone [phone]

## 2021-06-04 DIAGNOSIS — N186 End stage renal disease: Secondary | ICD-10-CM

## 2021-06-04 DIAGNOSIS — Z9115 Patient's noncompliance with renal dialysis: Secondary | ICD-10-CM

## 2021-06-04 DIAGNOSIS — N289 Disorder of kidney and ureter, unspecified: Secondary | ICD-10-CM

## 2021-06-04 DIAGNOSIS — J439 Emphysema, unspecified: Secondary | ICD-10-CM | POA: Diagnosis not present

## 2021-06-04 DIAGNOSIS — Z992 Dependence on renal dialysis: Secondary | ICD-10-CM

## 2021-06-04 DIAGNOSIS — I5033 Acute on chronic diastolic (congestive) heart failure: Secondary | ICD-10-CM | POA: Diagnosis not present

## 2021-06-04 DIAGNOSIS — E43 Unspecified severe protein-calorie malnutrition: Secondary | ICD-10-CM

## 2021-06-04 DIAGNOSIS — I10 Essential (primary) hypertension: Secondary | ICD-10-CM

## 2021-06-04 DIAGNOSIS — E8770 Fluid overload, unspecified: Principal | ICD-10-CM

## 2021-06-04 LAB — CBC WITH DIFFERENTIAL/PLATELET
Abs Immature Granulocytes: 0.11 10*3/uL — ABNORMAL HIGH (ref 0.00–0.07)
Basophils Absolute: 0 10*3/uL (ref 0.0–0.1)
Basophils Relative: 0 %
Eosinophils Absolute: 0.1 10*3/uL (ref 0.0–0.5)
Eosinophils Relative: 1 %
HCT: 25.9 % — ABNORMAL LOW (ref 39.0–52.0)
Hemoglobin: 8.3 g/dL — ABNORMAL LOW (ref 13.0–17.0)
Immature Granulocytes: 1 %
Lymphocytes Relative: 4 %
Lymphs Abs: 0.3 10*3/uL — ABNORMAL LOW (ref 0.7–4.0)
MCH: 31.2 pg (ref 26.0–34.0)
MCHC: 32 g/dL (ref 30.0–36.0)
MCV: 97.4 fL (ref 80.0–100.0)
Monocytes Absolute: 0.6 10*3/uL (ref 0.1–1.0)
Monocytes Relative: 6 %
Neutro Abs: 7.7 10*3/uL (ref 1.7–7.7)
Neutrophils Relative %: 88 %
Platelets: 92 10*3/uL — ABNORMAL LOW (ref 150–400)
RBC: 2.66 MIL/uL — ABNORMAL LOW (ref 4.22–5.81)
RDW: 17.4 % — ABNORMAL HIGH (ref 11.5–15.5)
WBC: 8.8 10*3/uL (ref 4.0–10.5)
nRBC: 0 % (ref 0.0–0.2)

## 2021-06-04 LAB — BASIC METABOLIC PANEL
Anion gap: 19 — ABNORMAL HIGH (ref 5–15)
BUN: 91 mg/dL — ABNORMAL HIGH (ref 6–20)
CO2: 21 mmol/L — ABNORMAL LOW (ref 22–32)
Calcium: 8.9 mg/dL (ref 8.9–10.3)
Chloride: 98 mmol/L (ref 98–111)
Creatinine, Ser: 13.06 mg/dL — ABNORMAL HIGH (ref 0.61–1.24)
GFR, Estimated: 4 mL/min — ABNORMAL LOW (ref >60)
Glucose, Bld: 77 mg/dL (ref 70–99)
Potassium: 4.6 mmol/L (ref 3.5–5.1)
Sodium: 138 mmol/L (ref 135–145)

## 2021-06-04 LAB — HEPATITIS B SURFACE ANTIBODY,QUALITATIVE: Hep B S Ab: REACTIVE — AB

## 2021-06-04 LAB — HEPATITIS B CORE ANTIBODY, TOTAL: Hep B Core Total Ab: REACTIVE — AB

## 2021-06-04 LAB — HEPATITIS B SURFACE ANTIGEN: Hepatitis B Surface Ag: NONREACTIVE

## 2021-06-04 MED ORDER — PEG-KCL-NACL-NASULF-NA ASC-C 100 G PO SOLR
0.5000 | Freq: Once | ORAL | Status: AC
  Administered 2021-06-04: 100 g via ORAL

## 2021-06-04 MED ORDER — RENA-VITE PO TABS
1.0000 | ORAL_TABLET | Freq: Every day | ORAL | Status: DC
  Administered 2021-06-04: 1 via ORAL
  Filled 2021-06-04: qty 1

## 2021-06-04 MED ORDER — PEG-KCL-NACL-NASULF-NA ASC-C 100 G PO SOLR
0.5000 | Freq: Once | ORAL | Status: DC
  Filled 2021-06-04: qty 1

## 2021-06-04 MED ORDER — PROSOURCE PLUS PO LIQD
30.0000 mL | Freq: Two times a day (BID) | ORAL | Status: DC
  Administered 2021-06-05: 30 mL via ORAL
  Filled 2021-06-04: qty 30

## 2021-06-04 MED ORDER — BOOST / RESOURCE BREEZE PO LIQD CUSTOM
1.0000 | Freq: Three times a day (TID) | ORAL | Status: DC
  Administered 2021-06-04 – 2021-06-05 (×2): 1 via ORAL

## 2021-06-04 MED ORDER — PEG-KCL-NACL-NASULF-NA ASC-C 100 G PO SOLR: 1.0000 | Freq: Once | ORAL | Status: DC

## 2021-06-04 MED ORDER — METOCLOPRAMIDE HCL 5 MG/ML IJ SOLN
10.0000 mg | Freq: Four times a day (QID) | INTRAMUSCULAR | Status: AC
  Administered 2021-06-04: 10 mg via INTRAVENOUS
  Filled 2021-06-04: qty 2

## 2021-06-04 MED ORDER — BISACODYL 5 MG PO TBEC: 20.0000 mg | DELAYED_RELEASE_TABLET | Freq: Once | ORAL | Status: DC

## 2021-06-04 NOTE — Progress Notes (Signed)
[facility] PROGRESS NOTE    Jordan [name]  QPR:[id number] DOB: May 17, 1966 DOA: 06/02/2021 PCP: Pcp, No      Brief Narrative:  [name] is a 55 y.o. M with ESRD MWF, COPD, chronic respiratory failure on 2L O2, medical noncompliance, alcohol use disorder, HTN, dCHF and ongoing smoking who presented with few days SOB since leaving [facility] AMA last week.  Patient admitted to [facility] last week for fluid overload due to missed HD.  Dialysis was recommended, but he stopped the session early and left AMA, subsequently skipped the next two HD sessions as an outpatient, and returned to the hospital with SOB.  In the ER, CXR showed pericardial effusion, edema.  Admitted for HD.         Assessment & Plan:  Anemia of chronic renal insufficiency Possible acute blood loss anemia Baseline Hgb appears to be 7-8 due to noncompliance with outpatient anemia treatments.  Here, it is down to 6.7 g/dL and he was noted to have FOB and he reported "dark stools".  Transfused 1 unit 9/12  No further bleeding - Continue IV pantoprazole - Consult GI, appreciate cares, plan for EGD colon tomorrow  ESRD - Consult Nephrology for routine HD  Nicotine dependence Cessation recommended  COPD No active flare  Chronic respiratory failure  Uses 2L O2 at home, no respiratory disease noted here.  Pericardial effusion Due to missed HD.  No clinical tamponade physiology.  Cardiology were consulted, they recommended adherence to HD schedule, no further work up.  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    Difficult to place patient No       Level of care: Med surg       MDM: The below labs and imaging reports were reviewed and summarized above.  Medication management as above.    DVT prophylaxis: Place and maintain sequential compression device Start: 06/03/21 1115  Code Status: DNR Family Communication:     Consultants:  GI Nephrology  Procedures:  9/14 EGD and colonoscopy planned  Antimicrobials:     Culture data:             Subjective: The patient is very sleepy, he mostly does not answer my questions.  He denies pain, shortness of breath.  He does not answer if he has had any bowel movements, or melena.  Objective: Vitals:   06/03/21 1839 06/03/21 2114 06/04/21 0446 06/04/21 1000  BP: (!) 161/49 129/83 (!) 125/95 (!) 138/92  Pulse: 100 100 99   Resp: _0 Temp: 97.8 F (36.6 C) 97.8 F (36.6 C) 97.6 F (36.4 C)   TempSrc: Oral Oral Oral   SpO2: 100% 98% 93%   Weight:      Height:        Intake/Output Summary (Last 24 hours) at 06/04/2021 1533 Last data filed at 06/04/2021 1300 Gross per 24 hour  Intake 823.83 ml  Output --  Net 823.83 ml   Filed Weights   06/02/21 1120 06/02/21  2109  Weight: 74.4 kg 77.7 kg    Examination: General appearance: Adult male, lying in bed, sleeping, sluggish and poorly arousable.  HEENT: Anicteric, conjunctiva pink, lids and lashes normal. No nasal deformity, discharge, epistaxis.  Lips moist, edentulous, oropharynx moist, no oral lesions.   Skin: Warm and dry.  no jaundice.  Diffuse prurigo Cardiac: RRR, nl S1-S2, no murmurs appreciated.  Capillary refill is brisk.  JVP not visible.  No LE edema.  Radial  pulses 2+ and symmetric. Respiratory: Normal respiratory rate and rhythm.  CTAB without rales or wheezes. Abdomen: Abdomen soft.  no TTP. No ascites, distension, hepatosplenomegaly.   MSK: No deformities or effusions. Neuro: Sleeping but rouses, gives a few answers, then gets tired and goes  back to sleep.  EOMI, moves all extremities with generalized weakness. Speech fluent.    Psych: Sensorium intact and responding to questions, but attention distracted, affect blunted, judgment insight appear normal.       Data Reviewed: I have personally reviewed following labs and imaging studies:  CBC: Recent Labs  Lab 05/28/21 1747 05/29/21 0639 06/02/21 1125 06/02/21 1152 06/03/21 0624 06/04/21 0338  WBC 9.6 9.4 9.2  --  8.4 8.8  NEUTROABS  --  9.0* 7.9*  --   --  7.7  HGB 6.9* 8.3* 7.7* 8.5* 6.7* 8.3*  HCT 22.2* 26.1* 24.9* 25.0* 21.5* 25.9*  MCV 104.2* 98.9 101.6*  --  99.5 97.4  PLT 150 143* 106*  --  96* 92*   Basic Metabolic Panel: Recent Labs  Lab 05/28/21 1747 05/29/21 0639 06/02/21 1125 06/02/21 1152 06/03/21 0624 06/04/21 0338  NA 142 142 138 138 142 138  K 5.6* 5.8* 5.1 4.9 3.8 4.6  CL 106 103 99 103 102 98  CO2 16* 18* 18*  --  25 21*  GLUCOSE 110* 183* 72 70 99 77  BUN 108* 132* 121* >130* 86* 91*  CREATININE 22.51* 22.27* 16.93* 17.20* 12.82* 13.06*  CALCIUM 8.4* 8.8* 9.6  --  9.0 8.9  MG  --  3.0*  --   --   --   --   PHOS  --  9.4*  --   --   --   --    GFR: Estimated Creatinine Clearance: 6.2 mL/min (A) (by C-G formula based on SCr of 13.06 mg/dL (H)). Liver Function Tests: Recent Labs  Lab 05/29/21 0639 06/02/21 1125  AST 15 17  ALT 9 13  ALKPHOS 100 87  BILITOT 1.4* 1.5*  PROT 8.1 7.6  ALBUMIN 3.5 3.4*   No results for input(s): LIPASE, AMYLASE in the last 168 hours. No results for input(s): AMMONIA in the last 168 hours. Coagulation Profile: No results for input(s): INR, PROTIME in the last 168 hours. Cardiac Enzymes: No results for input(s): CKTOTAL, CKMB, CKMBINDEX, TROPONINI in the last 168 hours. BNP (last 3 results) No results for input(s): PROBNP in the last 8760 hours. HbA1C: No results for input(s): HGBA1C in the last 72 hours. CBG: No results for input(s): GLUCAP in the last 168 hours. Lipid Profile: No results for  input(s): CHOL, HDL, LDLCALC, TRIG, CHOLHDL, LDLDIRECT in the last 72 hours. Thyroid Function Tests: No results for input(s): TSH, T4TOTAL, FREET4, T3FREE, THYROIDAB in the last 72 hours. Anemia Panel: Recent Labs    06/03/21 0624  FERRITIN 688*  TIBC 298  IRON 63   Urine analysis:    Component Value Date/Time   COLORURINE YELLOW (A) 01/13/2021 0153   APPEARANCEUR CLEAR (A) 01/13/2021 0153  LABSPEC 1.012 01/13/2021 0153   PHURINE 9.0 (H) 01/13/2021 0153   GLUCOSEU 150 (A) 01/13/2021 0153   HGBUR NEGATIVE 01/13/2021 0153   BILIRUBINUR NEGATIVE 01/13/2021 0153   KETONESUR NEGATIVE 01/13/2021 0153   PROTEINUR >=300 (A) 01/13/2021 0153   NITRITE NEGATIVE 01/13/2021 0153   LEUKOCYTESUR NEGATIVE 01/13/2021 0153   Sepsis Labs: _0 (procalcitonin:4,lacticacidven:4)  ) Recent Results (from the past 240 hour(s))  Resp Panel by RT-PCR (Flu A&B, Covid) Nasopharyngeal Swab     Status: None   Collection Time: 05/28/21  7:41 PM   Specimen: Nasopharyngeal Swab; Nasopharyngeal(NP) swabs in vial transport medium  Result Value Ref Range Status   SARS Coronavirus 2 by RT PCR NEGATIVE NEGATIVE Final    Comment: (NOTE) SARS-CoV-2 target nucleic acids are NOT DETECTED.  The SARS-CoV-2 RNA is generally detectable in upper respiratory specimens during the acute phase of infection. The lowest concentration of SARS-CoV-2 viral copies this assay can detect is 138 copies/mL. A negative result does not preclude SARS-Cov-2 infection and should not be used as the sole basis for treatment or other patient management decisions. A negative result may occur with  improper specimen collection/handling, submission of specimen other than nasopharyngeal swab, presence of viral mutation(s) within the areas targeted by this assay, and inadequate number of viral copies(<138 copies/mL). A negative result must be combined with clinical observations, patient history, and epidemiological information. The  expected result is Negative.  Fact Sheet for Patients:  EntrepreneurPulse.com.au  Fact Sheet for Healthcare Providers:  IncredibleEmployment.be  This test is no t yet approved or cleared by the [place] FDA and  has been authorized for detection and/or diagnosis of SARS-CoV-2 by FDA under an Emergency Use Authorization (EUA). This EUA will remain  in effect (meaning this test can be used) for the duration of the COVID-19 declaration under Section 564(b)(1) of the Act, 21 U.S.C.section 360bbb-3(b)(1), unless the authorization is terminated  or revoked sooner.       Influenza A by PCR NEGATIVE NEGATIVE Final   Influenza B by PCR NEGATIVE NEGATIVE Final    Comment: (NOTE) The Xpert Xpress SARS-CoV-2/FLU/RSV plus assay is intended as an aid in the diagnosis of influenza from Nasopharyngeal swab specimens and should not be used as a sole basis for treatment. Nasal washings and aspirates are unacceptable for Xpert Xpress SARS-CoV-2/FLU/RSV testing.  Fact Sheet for Patients: EntrepreneurPulse.com.au  Fact Sheet for Healthcare Providers: IncredibleEmployment.be  This test is not yet approved or cleared by the [place] FDA and has been authorized for detection and/or diagnosis of SARS-CoV-2 by FDA under an Emergency Use Authorization (EUA). This EUA will remain in effect (meaning this test can be used) for the duration of the COVID-19 declaration under Section 564(b)(1) of the Act, 21 U.S.C. section 360bbb-3(b)(1), unless the authorization is terminated or revoked.  Performed at [facility], [place], [place] [address]   SARS CORONAVIRUS 2 (TAT 6-24 HRS) Nasopharyngeal Nasopharyngeal Swab     Status: None   Collection Time: 06/03/21  1:28 PM   Specimen: Nasopharyngeal Swab  Result Value Ref Range Status   SARS Coronavirus 2 NEGATIVE NEGATIVE Final    Comment:  (NOTE) SARS-CoV-2 target nucleic acids are NOT DETECTED.  The SARS-CoV-2 RNA is generally detectable in upper and lower respiratory specimens during the acute phase of infection. Negative results do not preclude SARS-CoV-2 infection, do not rule out co-infections with other pathogens, and should not be used as the sole basis for treatment or other patient management decisions.  Negative results must be combined with clinical observations, patient history, and epidemiological information. The expected result is Negative.  Fact Sheet for Patients: SugarRoll.be  Fact Sheet for Healthcare Providers: https://www.woods-mathews.com/  This test is not yet approved or cleared by the [place] FDA and  has been authorized for detection and/or diagnosis of SARS-CoV-2 by FDA under an Emergency Use Authorization (EUA). This EUA will remain  in effect (meaning this test can be used) for the duration of the COVID-19 declaration under Se ction 564(b)(1) of the Act, 21 U.S.C. section 360bbb-3(b)(1), unless the authorization is terminated or revoked sooner.  Performed at [facility], [place] [address], [place], [place] [address]          Radiology Studies: ECHOCARDIOGRAM LIMITED  Result Date: 06/03/2021    ECHOCARDIOGRAM LIMITED REPORT   Patient Name:   JULUIS FITZSIMMONS Date of Exam: 06/03/2021 Medical Rec #:  [id number]        Height:       64.0 in Accession #:    [id number]       Weight:       171.3 lb Date of Birth:  Dec 10, 1965        BSA:          1.832 m Patient Age:    57 years         BP:           145/91 mmHg Patient Gender: M                HR:           99 bpm. Exam Location:  Inpatient Procedure: Limited Echo, Cardiac Doppler and Color Doppler STAT ECHO Indications:    I31.3 Pericardial effusion (noninflammatory)  History:        Patient has prior history of Echocardiogram examinations, most                 recent 06/02/2021.  Signs/Symptoms:Chest Pain and Dyspnea; Risk                 Factors:Current Smoker. ESRD. Recent covid infection.                 Pericardial effusion. ETOH.  Sonographer:    [place] Referring Phys: [id number] AMRIT ADHIKARI IMPRESSIONS  1. Limited echo for pericardial effusion  2. Left ventricular ejection fraction, by estimation, is 45 to 50%. The left ventricle has mildly decreased function. The left ventricle demonstrates global hypokinesis. There is the interventricular septum is flattened in systole and diastole, consistent with right ventricular pressure and volume overload.  3. Pericardial effusion measures up to 1.5 cm. Moderate pericardial effusion. The pericardial effusion is circumferential. There is no evidence of cardiac tamponade.  4. The mitral valve is degenerative. Moderate to severe mitral valve regurgitation.  5. The tricuspid valve is degenerative. Tricuspid valve regurgitation is moderate.  6. The aortic valve is tricuspid.  7. Right ventricular systolic function is normal. The right ventricular size is normal. There is severely elevated pulmonary artery systolic pressure. The estimated right ventricular systolic pressure is 79.8 mmHg.  8. The inferior vena cava is dilated in size with <50% respiratory variability, suggesting right atrial pressure of 15 mmHg.  9. Left atrial size was moderately dilated. 10. Right atrial size was mildly dilated. Comparison(s): Changes from prior study are noted. 06/03/2021: LVEF 40-45%, small pericardial effusion, no tamponade. The pericardial effusion appears larger today then by echo yesterday. Cannot exclude clinical tamponade given  degree of pulmonary hypertension. Clinical correlation is advised. Conclusion(s)/Recommendation(s): Due to the above findings, cardiology consult is recommended. Critical findings reported to Dr. Veverly Fells and acknowledged at 2:23 pm. FINDINGS  Left Ventricle: Left ventricular ejection fraction, by estimation, is 45 to 50%. The  left ventricle has mildly decreased function. The left ventricle demonstrates global hypokinesis. The interventricular septum is flattened in systole and diastole, consistent with right ventricular pressure and volume overload. Right Ventricle: The right ventricular size is normal. No increase in right ventricular wall thickness. Right ventricular systolic function is normal. There is severely elevated pulmonary artery systolic pressure. The tricuspid regurgitant velocity is 3.48 m/s, and with an assumed right atrial pressure of 15 mmHg, the estimated right ventricular systolic pressure is 74.1 mmHg. Left Atrium: Left atrial size was moderately dilated. Right Atrium: Right atrial size was mildly dilated. Pericardium: Pericardial effusion measures up to 1.5 cm. A moderately sized pericardial effusion is present. The pericardial effusion is circumferential. There is no evidence of cardiac tamponade. Mitral Valve: The mitral valve is degenerative in appearance. Moderate to severe mitral valve regurgitation, with wall-impinging jet. Tricuspid Valve: The tricuspid valve is degenerative in appearance. Tricuspid valve regurgitation is moderate. Aortic Valve: The aortic valve is tricuspid. Venous: The inferior vena cava is dilated in size with less than 50% respiratory variability, suggesting right atrial pressure of 15 mmHg. IAS/Shunts: No atrial level shunt detected by color flow Doppler.  LV Volumes (MOD) LV vol d, MOD A2C: 112.0 ml LV vol d, MOD A4C: 114.0 ml LV vol s, MOD A2C: 69.9 ml LV vol s, MOD A4C: 54.9 ml LV SV MOD A2C:     42.1 ml LV SV MOD A4C:     114.0 ml LV SV MOD BP:      50.1 ml IVC IVC diam: 2.80 cm TRICUSPID VALVE TR Peak grad:   48.4 mmHg TR Vmax:        348.00 cm/s Lyman Bishop MD Electronically signed by Lyman Bishop MD Signature Date/Time: 06/03/2021/2:32:48 PM    Final         Scheduled Meds:  bisacodyl  20 mg Oral Once   calcium acetate  1,334 mg Oral TID WC   Chlorhexidine Gluconate  Cloth  6 each Topical Q0600   Chlorhexidine Gluconate Cloth  6 each Topical Q0600   cinacalcet  30 mg Oral Q breakfast   guaiFENesin  600 mg Oral BID   hydrALAZINE  50 mg Oral TID   hydrOXYzine  25 mg Oral TID   metoCLOPramide (REGLAN) injection  10 mg Intravenous Q6H   nicotine  14 mg Transdermal Daily   pantoprazole (PROTONIX) IV  40 mg Intravenous Q12H   peg 3350 powder  0.5 kit Oral Once   And   peg 3350 powder  0.5 kit Oral Once   sodium chloride flush  3 mL Intravenous Q12H   Continuous Infusions:   LOS: 0 days    Time spent: 25 minutes    Edwin Dada, MD [facility] 06/04/2021, 3:33 PM     Please page though AMION or Epic secure chat:  For [employer], Adult nurse

## 2021-06-04 NOTE — Progress Notes (Addendum)
Daily Rounding Note  06/04/2021, 12:55 PM  LOS: 0 days   SUBJECTIVE:   Chief complaint: Anemia, FOBT positive. No complaints.  Brown stools yesterday.    OBJECTIVE:         Vital signs in last 24 hours:    Temp:  [97.6 F (36.4 C)-98.3 F (36.8 C)] 97.6 F (36.4 C) (09/13 0446) Pulse Rate:  [92-100] 99 (09/13 0446) Resp:  [18-20] 20 (09/13 0446) BP: (124-161)/(42-95) 125/95 (09/13 0446) SpO2:  [93 %-100 %] 93 % (09/13 0446)   Filed Weights   06/02/21 1120 06/02/21 2109  Weight: 74.4 kg 77.7 kg   General: Sleeping and snoring loudly when I entered the room but easily awoken.  Looks chronically ill.  Comfortable. Heart: RRR Chest: Diminished breath sounds.  No labored breathing.  No cough Abdomen: Soft without tenderness.  Active bowel sounds.  Minor distention. Extremities: No CCE Neuro/Psych: Alert.  Appropriate.  Oriented x3.  Intake/Output from previous day: 09/12 0701 - 09/13 0700 In: 523.8 [P.O.:120; I.V.:23.8; Blood:380] Out: -   Intake/Output this shift: No intake/output data recorded.  Lab Results: Recent Labs    06/02/21 1125 06/02/21 1152 06/03/21 0624 06/04/21 0338  WBC 9.2  --  8.4 8.8  HGB 7.7* 8.5* 6.7* 8.3*  HCT 24.9* 25.0* 21.5* 25.9*  PLT 106*  --  96* 92*   BMET Recent Labs    06/02/21 1125 06/02/21 1152 06/03/21 0624 06/04/21 0338  NA 138 138 142 138  K 5.1 4.9 3.8 4.6  CL 99 103 102 98  CO2 18*  --  25 21*  GLUCOSE 72 70 99 77  BUN 121* >130* 86* 91*  CREATININE 16.93* 17.20* 12.82* 13.06*  CALCIUM 9.6  --  9.0 8.9   LFT Recent Labs    06/02/21 1125  PROT 7.6  ALBUMIN 3.4*  AST 17  ALT 13  ALKPHOS 87  BILITOT 1.5*   PT/INR No results for input(s): LABPROT, INR in the last 72 hours. Hepatitis Panel Recent Labs    06/04/21 0338  HEPBSAG NON REACTIVE    Studies/Results: ECHOCARDIOGRAM LIMITED  Result Date: 06/03/2021    ECHOCARDIOGRAM LIMITED REPORT    Patient Name:   Jordan [name] Date of Exam: 06/03/2021 Medical Rec #:  [id number]        Height:       64.0 in Accession #:    [id number]       Weight:       171.3 lb Date of Birth:  [birth year]        BSA:          1.832 m Patient Age:    55 years         BP:           145/91 mmHg Patient Gender: M                HR:           99 bpm. Exam Location:  Inpatient Procedure: Limited Echo, Cardiac Doppler and Color Doppler STAT ECHO Indications:    I31.3 Pericardial effusion (noninflammatory)  History:        Patient has prior history of Echocardiogram examinations, most                 recent 06/02/2021. Signs/Symptoms:Chest Pain and Dyspnea; Risk                 Factors:Current Smoker. ESRD. Recent covid  infection.                 Pericardial effusion. ETOH.  Sonographer:    [place] Referring Phys: [id number] AMRIT ADHIKARI IMPRESSIONS  1. Limited echo for pericardial effusion  2. Left ventricular ejection fraction, by estimation, is 45 to 50%. The left ventricle has mildly decreased function. The left ventricle demonstrates global hypokinesis. There is the interventricular septum is flattened in systole and diastole, consistent with right ventricular pressure and volume overload.  3. Pericardial effusion measures up to 1.5 cm. Moderate pericardial effusion. The pericardial effusion is circumferential. There is no evidence of cardiac tamponade.  4. The mitral valve is degenerative. Moderate to severe mitral valve regurgitation.  5. The tricuspid valve is degenerative. Tricuspid valve regurgitation is moderate.  6. The aortic valve is tricuspid.  7. Right ventricular systolic function is normal. The right ventricular size is normal. There is severely elevated pulmonary artery systolic pressure. The estimated right ventricular systolic pressure is [id number] mmHg.  8. The inferior vena cava is dilated in size with <50% respiratory variability, suggesting right atrial pressure of 15 mmHg.  9. Left atrial size was moderately  dilated. 10. Right atrial size was mildly dilated. Comparison(s): Changes from prior study are noted. 06/03/2021: LVEF 40-45%, small pericardial effusion, no tamponade. The pericardial effusion appears larger today then by echo yesterday. Cannot exclude clinical tamponade given degree of pulmonary hypertension. Clinical correlation is advised. Conclusion(s)/Recommendation(s): Due to the above findings, cardiology consult is recommended. Critical findings reported to Dr. Veverly Fells and acknowledged at 2:23 pm. FINDINGS  Left Ventricle: Left ventricular ejection fraction, by estimation, is 45 to 50%. The left ventricle has mildly decreased function. The left ventricle demonstrates global hypokinesis. The interventricular septum is flattened in systole and diastole, consistent with right ventricular pressure and volume overload. Right Ventricle: The right ventricular size is normal. No increase in right ventricular wall thickness. Right ventricular systolic function is normal. There is severely elevated pulmonary artery systolic pressure. The tricuspid regurgitant velocity is 3.48 m/s, and with an assumed right atrial pressure of 15 mmHg, the estimated right ventricular systolic pressure is [id number] mmHg. Left Atrium: Left atrial size was moderately dilated. Right Atrium: Right atrial size was mildly dilated. Pericardium: Pericardial effusion measures up to 1.5 cm. A moderately sized pericardial effusion is present. The pericardial effusion is circumferential. There is no evidence of cardiac tamponade. Mitral Valve: The mitral valve is degenerative in appearance. Moderate to severe mitral valve regurgitation, with wall-impinging jet. Tricuspid Valve: The tricuspid valve is degenerative in appearance. Tricuspid valve regurgitation is moderate. Aortic Valve: The aortic valve is tricuspid. Venous: The inferior vena cava is dilated in size with less than 50% respiratory variability, suggesting right atrial pressure of 15 mmHg.  IAS/Shunts: No atrial level shunt detected by color flow Doppler.  LV Volumes (MOD) LV vol d, MOD A2C: 112.0 ml LV vol d, MOD A4C: 114.0 ml LV vol s, MOD A2C: 69.9 ml LV vol s, MOD A4C: 54.9 ml LV SV MOD A2C:     42.1 ml LV SV MOD A4C:     114.0 ml LV SV MOD BP:      50.1 ml IVC IVC diam: 2.80 cm TRICUSPID VALVE TR Peak grad:   48.4 mmHg TR Vmax:        348.00 cm/s Lyman Bishop MD Electronically signed by Lyman Bishop MD Signature Date/Time: 06/03/2021/2:32:48 PM    Final    ECHOCARDIOGRAM LIMITED  Result Date: 06/02/2021    ECHOCARDIOGRAM  LIMITED REPORT   Patient Name:   Jordan [name] Date of Exam: 06/02/2021 Medical Rec #:  [id number]        Height:       64.0 in Accession #:    [id number]       Weight:       164.0 lb Date of Birth:  04/20/1966        BSA:          1.798 m Patient Age:    43 years         BP:           144/103 mmHg Patient Gender: M                HR:           103 bpm. Exam Location:  Inpatient Procedure: Limited Echo, Limited Color Doppler and Cardiac Doppler STAT ECHO Indications:    pericardial effusion  History:        Patient has prior history of Echocardiogram examinations, most                 recent 03/02/2021. CHF, COPD and end stage renal disease.; Risk                 Factors:Hypertension.  Sonographer:    Johny Chess RDCS Referring Phys: [place]  1. Left ventricular ejection fraction, by estimation, is 40 to 45%. The left ventricle has mildly decreased function. The left ventricle demonstrates global hypokinesis. There is mild concentric left ventricular hypertrophy. Left ventricular diastolic parameters are consistent with Grade II diastolic dysfunction (pseudonormalization).  2. Right ventricular systolic function is normal. The right ventricular size is normal. There is severely elevated pulmonary artery systolic pressure.  3. Left atrial size was mildly dilated.  4. Right atrial size was mildly dilated.  5. Compared with the echo 02/2021, there is  now a small pericardial effusion. The IVC is dilated and there is absent collapse of the IVC, suggesting RA pressure is at least 15 mmHg. However, there are no other signs of tamponade. Tamponade physiology is unlikely. a small pericardial effusion is present. The pericardial effusion is circumferential. There is no evidence of cardiac tamponade. Large pleural effusion in the left lateral region.  6. The mitral valve is degenerative. Mild to moderate mitral valve regurgitation. No evidence of mitral stenosis.  7. Tricuspid valve regurgitation is moderate.  8. The aortic valve is tricuspid. There is mild calcification of the aortic valve. There is mild thickening of the aortic valve. Aortic valve regurgitation is not visualized. No aortic stenosis is present.  9. The inferior vena cava is dilated in size with <50% respiratory variability, suggesting right atrial pressure of 15 mmHg. FINDINGS  Left Ventricle: Left ventricular ejection fraction, by estimation, is 40 to 45%. The left ventricle has mildly decreased function. The left ventricle demonstrates global hypokinesis. The left ventricular internal cavity size was normal in size. There is  mild concentric left ventricular hypertrophy. Left ventricular diastolic parameters are consistent with Grade II diastolic dysfunction (pseudonormalization). Right Ventricle: The right ventricular size is normal. No increase in right ventricular wall thickness. Right ventricular systolic function is normal. There is severely elevated pulmonary artery systolic pressure. The tricuspid regurgitant velocity is 4.21 m/s, and with an assumed right atrial pressure of 15 mmHg, the estimated right ventricular systolic pressure is 0000000 mmHg. Left Atrium: Left atrial size was mildly dilated. Right Atrium: Right atrial size was mildly dilated. Pericardium: Compared  with the echo 02/2021, there is now a small pericardial effusion. The IVC is dilated and there is absent collapse of the IVC,  suggesting RA pressure is at least 15 mmHg. However, there are no other signs of tamponade. Tamponade physiology is unlikely. A small pericardial effusion is present. The pericardial effusion is circumferential. There is no evidence of cardiac tamponade. Mitral Valve: The mitral valve is degenerative in appearance. Mild to moderate mitral valve regurgitation, with posteriorly-directed jet. No evidence of mitral valve stenosis. Tricuspid Valve: The tricuspid valve is normal in structure. Tricuspid valve regurgitation is moderate . No evidence of tricuspid stenosis. Aortic Valve: The aortic valve is tricuspid. There is mild calcification of the aortic valve. There is mild thickening of the aortic valve. Aortic valve regurgitation is not visualized. No aortic stenosis is present. Pulmonic Valve: The pulmonic valve was normal in structure. Pulmonic valve regurgitation is not visualized. No evidence of pulmonic stenosis. Aorta: The aortic root is normal in size and structure. Venous: The inferior vena cava is dilated in size with less than 50% respiratory variability, suggesting right atrial pressure of 15 mmHg. IAS/Shunts: No atrial level shunt detected by color flow Doppler. Additional Comments: There is a large pleural effusion in the left lateral region. Mild ascites is present. LEFT VENTRICLE PLAX 2D LVIDd:         4.80 cm      Diastology LVIDs:         3.90 cm      LV e' medial:    5.98 cm/s LV PW:         1.30 cm      LV E/e' medial:  24.7 LV IVS:        1.30 cm      LV e' lateral:   8.16 cm/s LVOT diam:     1.90 cm      LV E/e' lateral: 18.1 LV SV:         55 LV SV Index:   30 LVOT Area:     2.84 cm  LV Volumes (MOD) LV vol d, MOD A4C: 129.0 ml LV vol s, MOD A4C: 74.7 ml LV SV MOD A4C:     129.0 ml IVC IVC diam: 2.50 cm LEFT ATRIUM         Index LA diam:    4.70 cm 2.61 cm/m  AORTIC VALVE LVOT Vmax:   123.00 cm/s LVOT Vmean:  85.200 cm/s LVOT VTI:    0.193 m  AORTA Ao Asc diam: 3.10 cm MITRAL VALVE                 TRICUSPID VALVE MV Area (PHT): 4.96 cm     TR Peak grad:   70.9 mmHg MV Decel Time: 153 msec     TR Vmax:        421.00 cm/s MV E velocity: 148.00 cm/s MV A velocity: 79.80 cm/s   SHUNTS MV E/A ratio:  1.85         Systemic VTI:  0.19 m                             Systemic Diam: 1.90 cm Skeet Latch MD Electronically signed by Skeet Latch MD Signature Date/Time: 06/02/2021/2:36:10 PM    Final     ASSESMENT:      Anemia, FOBT positive.  S/p 1 PRBC.  Hgb 6.7 >> 0.3.     ESRD on hemodialysis.  Frequent noncompliance  with hemodialysis sessions.  Admitted with volume overload, uremia.     Thrombocytopenia.  Platelets 92   PLAN   Colonoscopy and EGD set for 830 tomorrow morning.  Diet switched over to clears.  See orders for bowel prep with split dose movie prep, Dulcolax, 2 doses of Reglan.  Patient agreeable to proceed.    Azucena Freed  06/04/2021, 12:55 PM Phone [phone]

## 2021-06-04 NOTE — H&P (View-Only) (Signed)
Daily Rounding Note  06/04/2021, 12:55 PM  LOS: 0 days   SUBJECTIVE:   Chief complaint: Anemia, FOBT positive. No complaints.  Brown stools yesterday.    OBJECTIVE:         Vital signs in last 24 hours:    Temp:  [97.6 F (36.4 C)-98.3 F (36.8 C)] 97.6 F (36.4 C) (09/13 0446) Pulse Rate:  [92-100] 99 (09/13 0446) Resp:  [18-20] 20 (09/13 0446) BP: (124-161)/(42-95) 125/95 (09/13 0446) SpO2:  [93 %-100 %] 93 % (09/13 0446)   Filed Weights   06/02/21 1120 06/02/21 2109  Weight: 74.4 kg 77.7 kg   General: Sleeping and snoring loudly when I entered the room but easily awoken.  Looks chronically ill.  Comfortable. Heart: RRR Chest: Diminished breath sounds.  No labored breathing.  No cough Abdomen: Soft without tenderness.  Active bowel sounds.  Minor distention. Extremities: No CCE Neuro/Psych: Alert.  Appropriate.  Oriented x3.  Intake/Output from previous day: 09/12 0701 - 09/13 0700 In: 523.8 [P.O.:120; I.V.:23.8; Blood:380] Out: -   Intake/Output this shift: No intake/output data recorded.  Lab Results: Recent Labs    06/02/21 1125 06/02/21 1152 06/03/21 0624 06/04/21 0338  WBC 9.2  --  8.4 8.8  HGB 7.7* 8.5* 6.7* 8.3*  HCT 24.9* 25.0* 21.5* 25.9*  PLT 106*  --  96* 92*   BMET Recent Labs    06/02/21 1125 06/02/21 1152 06/03/21 0624 06/04/21 0338  NA 138 138 142 138  K 5.1 4.9 3.8 4.6  CL 99 103 102 98  CO2 18*  --  25 21*  GLUCOSE 72 70 99 77  BUN 121* >130* 86* 91*  CREATININE 16.93* 17.20* 12.82* 13.06*  CALCIUM 9.6  --  9.0 8.9   LFT Recent Labs    06/02/21 1125  PROT 7.6  ALBUMIN 3.4*  AST 17  ALT 13  ALKPHOS 87  BILITOT 1.5*   PT/INR No results for input(s): LABPROT, INR in the last 72 hours. Hepatitis Panel Recent Labs    06/04/21 0338  HEPBSAG NON REACTIVE    Studies/Results: ECHOCARDIOGRAM LIMITED  Result Date: 06/03/2021    ECHOCARDIOGRAM LIMITED REPORT    Patient Name:   Jordan [name] Date of Exam: 06/03/2021 Medical Rec #:  [id number]        Height:       64.0 in Accession #:    [id number]       Weight:       171.3 lb Date of Birth:  Jun 12, 1966        BSA:          1.832 m Patient Age:    55 years         BP:           145/91 mmHg Patient Gender: M                HR:           99 bpm. Exam Location:  Inpatient Procedure: Limited Echo, Cardiac Doppler and Color Doppler STAT ECHO Indications:    I31.3 Pericardial effusion (noninflammatory)  History:        Patient has prior history of Echocardiogram examinations, most                 recent 06/02/2021. Signs/Symptoms:Chest Pain and Dyspnea; Risk                 Factors:Current Smoker. ESRD. Recent covid  infection.                 Pericardial effusion. ETOH.  Sonographer:    [place] Referring Phys: [id number] AMRIT ADHIKARI IMPRESSIONS  1. Limited echo for pericardial effusion  2. Left ventricular ejection fraction, by estimation, is 45 to 50%. The left ventricle has mildly decreased function. The left ventricle demonstrates global hypokinesis. There is the interventricular septum is flattened in systole and diastole, consistent with right ventricular pressure and volume overload.  3. Pericardial effusion measures up to 1.5 cm. Moderate pericardial effusion. The pericardial effusion is circumferential. There is no evidence of cardiac tamponade.  4. The mitral valve is degenerative. Moderate to severe mitral valve regurgitation.  5. The tricuspid valve is degenerative. Tricuspid valve regurgitation is moderate.  6. The aortic valve is tricuspid.  7. Right ventricular systolic function is normal. The right ventricular size is normal. There is severely elevated pulmonary artery systolic pressure. The estimated right ventricular systolic pressure is [id number] mmHg.  8. The inferior vena cava is dilated in size with <50% respiratory variability, suggesting right atrial pressure of 15 mmHg.  9. Left atrial size was moderately  dilated. 10. Right atrial size was mildly dilated. Comparison(s): Changes from prior study are noted. 06/03/2021: LVEF 40-45%, small pericardial effusion, no tamponade. The pericardial effusion appears larger today then by echo yesterday. Cannot exclude clinical tamponade given degree of pulmonary hypertension. Clinical correlation is advised. Conclusion(s)/Recommendation(s): Due to the above findings, cardiology consult is recommended. Critical findings reported to Dr. Veverly Fells and acknowledged at 2:23 pm. FINDINGS  Left Ventricle: Left ventricular ejection fraction, by estimation, is 45 to 50%. The left ventricle has mildly decreased function. The left ventricle demonstrates global hypokinesis. The interventricular septum is flattened in systole and diastole, consistent with right ventricular pressure and volume overload. Right Ventricle: The right ventricular size is normal. No increase in right ventricular wall thickness. Right ventricular systolic function is normal. There is severely elevated pulmonary artery systolic pressure. The tricuspid regurgitant velocity is 3.48 m/s, and with an assumed right atrial pressure of 15 mmHg, the estimated right ventricular systolic pressure is [id number] mmHg. Left Atrium: Left atrial size was moderately dilated. Right Atrium: Right atrial size was mildly dilated. Pericardium: Pericardial effusion measures up to 1.5 cm. A moderately sized pericardial effusion is present. The pericardial effusion is circumferential. There is no evidence of cardiac tamponade. Mitral Valve: The mitral valve is degenerative in appearance. Moderate to severe mitral valve regurgitation, with wall-impinging jet. Tricuspid Valve: The tricuspid valve is degenerative in appearance. Tricuspid valve regurgitation is moderate. Aortic Valve: The aortic valve is tricuspid. Venous: The inferior vena cava is dilated in size with less than 50% respiratory variability, suggesting right atrial pressure of 15 mmHg.  IAS/Shunts: No atrial level shunt detected by color flow Doppler.  LV Volumes (MOD) LV vol d, MOD A2C: 112.0 ml LV vol d, MOD A4C: 114.0 ml LV vol s, MOD A2C: 69.9 ml LV vol s, MOD A4C: 54.9 ml LV SV MOD A2C:     42.1 ml LV SV MOD A4C:     114.0 ml LV SV MOD BP:      50.1 ml IVC IVC diam: 2.80 cm TRICUSPID VALVE TR Peak grad:   48.4 mmHg TR Vmax:        348.00 cm/s Lyman Bishop MD Electronically signed by Lyman Bishop MD Signature Date/Time: 06/03/2021/2:32:48 PM    Final    ECHOCARDIOGRAM LIMITED  Result Date: 06/02/2021    ECHOCARDIOGRAM  LIMITED REPORT   Patient Name:   Jordan [name] Date of Exam: 06/02/2021 Medical Rec #:  [id number]        Height:       64.0 in Accession #:    [id number]       Weight:       164.0 lb Date of Birth:  12-09-1965        BSA:          1.798 m Patient Age:    44 years         BP:           144/103 mmHg Patient Gender: M                HR:           103 bpm. Exam Location:  Inpatient Procedure: Limited Echo, Limited Color Doppler and Cardiac Doppler STAT ECHO Indications:    pericardial effusion  History:        Patient has prior history of Echocardiogram examinations, most                 recent 03/02/2021. CHF, COPD and end stage renal disease.; Risk                 Factors:Hypertension.  Sonographer:    Johny Chess RDCS Referring Phys: [place]  1. Left ventricular ejection fraction, by estimation, is 40 to 45%. The left ventricle has mildly decreased function. The left ventricle demonstrates global hypokinesis. There is mild concentric left ventricular hypertrophy. Left ventricular diastolic parameters are consistent with Grade II diastolic dysfunction (pseudonormalization).  2. Right ventricular systolic function is normal. The right ventricular size is normal. There is severely elevated pulmonary artery systolic pressure.  3. Left atrial size was mildly dilated.  4. Right atrial size was mildly dilated.  5. Compared with the echo 02/2021, there is  now a small pericardial effusion. The IVC is dilated and there is absent collapse of the IVC, suggesting RA pressure is at least 15 mmHg. However, there are no other signs of tamponade. Tamponade physiology is unlikely. a small pericardial effusion is present. The pericardial effusion is circumferential. There is no evidence of cardiac tamponade. Large pleural effusion in the left lateral region.  6. The mitral valve is degenerative. Mild to moderate mitral valve regurgitation. No evidence of mitral stenosis.  7. Tricuspid valve regurgitation is moderate.  8. The aortic valve is tricuspid. There is mild calcification of the aortic valve. There is mild thickening of the aortic valve. Aortic valve regurgitation is not visualized. No aortic stenosis is present.  9. The inferior vena cava is dilated in size with <50% respiratory variability, suggesting right atrial pressure of 15 mmHg. FINDINGS  Left Ventricle: Left ventricular ejection fraction, by estimation, is 40 to 45%. The left ventricle has mildly decreased function. The left ventricle demonstrates global hypokinesis. The left ventricular internal cavity size was normal in size. There is  mild concentric left ventricular hypertrophy. Left ventricular diastolic parameters are consistent with Grade II diastolic dysfunction (pseudonormalization). Right Ventricle: The right ventricular size is normal. No increase in right ventricular wall thickness. Right ventricular systolic function is normal. There is severely elevated pulmonary artery systolic pressure. The tricuspid regurgitant velocity is 4.21 m/s, and with an assumed right atrial pressure of 15 mmHg, the estimated right ventricular systolic pressure is 0000000 mmHg. Left Atrium: Left atrial size was mildly dilated. Right Atrium: Right atrial size was mildly dilated. Pericardium: Compared  with the echo 02/2021, there is now a small pericardial effusion. The IVC is dilated and there is absent collapse of the IVC,  suggesting RA pressure is at least 15 mmHg. However, there are no other signs of tamponade. Tamponade physiology is unlikely. A small pericardial effusion is present. The pericardial effusion is circumferential. There is no evidence of cardiac tamponade. Mitral Valve: The mitral valve is degenerative in appearance. Mild to moderate mitral valve regurgitation, with posteriorly-directed jet. No evidence of mitral valve stenosis. Tricuspid Valve: The tricuspid valve is normal in structure. Tricuspid valve regurgitation is moderate . No evidence of tricuspid stenosis. Aortic Valve: The aortic valve is tricuspid. There is mild calcification of the aortic valve. There is mild thickening of the aortic valve. Aortic valve regurgitation is not visualized. No aortic stenosis is present. Pulmonic Valve: The pulmonic valve was normal in structure. Pulmonic valve regurgitation is not visualized. No evidence of pulmonic stenosis. Aorta: The aortic root is normal in size and structure. Venous: The inferior vena cava is dilated in size with less than 50% respiratory variability, suggesting right atrial pressure of 15 mmHg. IAS/Shunts: No atrial level shunt detected by color flow Doppler. Additional Comments: There is a large pleural effusion in the left lateral region. Mild ascites is present. LEFT VENTRICLE PLAX 2D LVIDd:         4.80 cm      Diastology LVIDs:         3.90 cm      LV e' medial:    5.98 cm/s LV PW:         1.30 cm      LV E/e' medial:  24.7 LV IVS:        1.30 cm      LV e' lateral:   8.16 cm/s LVOT diam:     1.90 cm      LV E/e' lateral: 18.1 LV SV:         55 LV SV Index:   30 LVOT Area:     2.84 cm  LV Volumes (MOD) LV vol d, MOD A4C: 129.0 ml LV vol s, MOD A4C: 74.7 ml LV SV MOD A4C:     129.0 ml IVC IVC diam: 2.50 cm LEFT ATRIUM         Index LA diam:    4.70 cm 2.61 cm/m  AORTIC VALVE LVOT Vmax:   123.00 cm/s LVOT Vmean:  85.200 cm/s LVOT VTI:    0.193 m  AORTA Ao Asc diam: 3.10 cm MITRAL VALVE                 TRICUSPID VALVE MV Area (PHT): 4.96 cm     TR Peak grad:   70.9 mmHg MV Decel Time: 153 msec     TR Vmax:        421.00 cm/s MV E velocity: 148.00 cm/s MV A velocity: 79.80 cm/s   SHUNTS MV E/A ratio:  1.85         Systemic VTI:  0.19 m                             Systemic Diam: 1.90 cm Skeet Latch MD Electronically signed by Skeet Latch MD Signature Date/Time: 06/02/2021/2:36:10 PM    Final     ASSESMENT:      Anemia, FOBT positive.  S/p 1 PRBC.  Hgb 6.7 >> 0.3.     ESRD on hemodialysis.  Frequent noncompliance  with hemodialysis sessions.  Admitted with volume overload, uremia.     Thrombocytopenia.  Platelets 92   PLAN   Colonoscopy and EGD set for 830 tomorrow morning.  Diet switched over to clears.  See orders for bowel prep with split dose movie prep, Dulcolax, 2 doses of Reglan.  Patient agreeable to proceed.    Azucena Freed  06/04/2021, 12:55 PM Phone [phone]

## 2021-06-04 NOTE — Progress Notes (Addendum)
Pt missed 1500 dose of moviprep d/t being off unit in dialysis Pt began prep at second scheduled dose at 2306; Pt educated on the importance of drinking prep in order to be prepared for procedure. Will continue monitor pt and encourage to complete prep.   0428 update: Pt drank about 75% of prep and stated BM is clear. PT will progress to NPO at 0500.

## 2021-06-04 NOTE — TOC Initial Note (Signed)
Transition of Care [facility]) - Initial/Assessment Note    Patient Details  Name: Jordan [name] MRN: [id number] Date of Birth: 02/03/1966  Transition of Care [facility]) CM/SW Contact:    Tom-[name], Renea Ee, RN Phone Number: 06/04/2021, 10:04 AM  Clinical Narrative:                 CM spoke with patient at bedside. Stated he lives alone and his children are supportive. Uses [employer] for transportation to and from his appointments. Does not have any DME's. Smokes 2-3 sticks of cigarettes a day. Planning on quitting. Educational materials given to patient at bedside. No PT/OT orders at this time for discharge disposition. MD made aware. Will continue to follow with TOC needs.    Barriers to Discharge: Continued Medical Work up   Patient Goals and CMS Choice Patient states their goals for this hospitalization and ongoing recovery are:: To go home CMS Medicare.gov Compare Post Acute Care list provided to:: Patient Choice offered to / list presented to : Patient  Expected Discharge Plan and Services     Discharge Planning Services: CM Consult   Living arrangements for the past 2 months: Single Family Home                                      Prior Living Arrangements/Services Living arrangements for the past 2 months: Single Family Home Lives with:: Self Patient language and need for interpreter reviewed:: Yes Do you feel safe going back to the place where you live?: Yes      Need for Family Participation in Patient Care: Yes (Comment) Care giver support system in place?: Yes (comment)   Criminal Activity/Legal Involvement Pertinent to Current Situation/Hospitalization: No - Comment as needed  Activities of Daily Living   ADL Screening (condition at time of admission) Patient's cognitive ability adequate to safely complete daily activities?: Yes Is the patient deaf or have difficulty hearing?: No Does the patient have difficulty seeing, even when wearing  glasses/contacts?: No Does the patient have difficulty concentrating, remembering, or making decisions?: Yes Patient able to express need for assistance with ADLs?: Yes Does the patient have difficulty dressing or bathing?: No Independently performs ADLs?: Yes (appropriate for developmental age) Does the patient have difficulty walking or climbing stairs?: Yes Weakness of Legs: Both Weakness of Arms/Hands: None  Permission Sought/Granted Permission sought to share information with : Case Manager Permission granted to share information with : Yes, Verbal Permission Granted              Emotional Assessment Appearance:: Appears stated age Attitude/Demeanor/Rapport: Engaged Affect (typically observed): Accepting, Appropriate, Hopeful Orientation: : Oriented to Self, Oriented to Place, Oriented to  Time, Oriented to Situation Alcohol / Substance Use: Tobacco Use (Smokes 2-3 sticks of cigarettes a day.) Psych Involvement: No (comment)  Admission diagnosis:  Hypervolemia, unspecified hypervolemia type [E87.70] Non-compliance with renal dialysis ([place]) [Z91.15] Hypervolemia associated with renal insufficiency [E87.70, N28.9] Patient Active Problem List   Diagnosis Date Noted   Hypervolemia associated with renal insufficiency 06/02/2021   Symptomatic anemia 05/28/2021   Acute on chronic diastolic CHF (congestive heart failure) ([place]) 05/28/2021   Hyperkalemia 05/28/2021   Pulmonary edema 04/29/2021   Pruritus 04/29/2021   COVID-19 virus infection 04/29/2021   Thrombocytopenia ([place]) 04/08/2021   COPD (chronic obstructive pulmonary disease) ([place]) 04/08/2021   CHF (congestive heart failure) ([place]) 03/25/2021   PAF (paroxysmal atrial fibrillation) (  [place]) 03/20/2021   Chronic diastolic CHF (congestive heart failure) ([place]) 03/20/2021   Alcohol abuse 03/20/2021   Vision loss of left eye 03/20/2021   SOB (shortness of breath) 03/20/2021   Shortness of breath 03/10/2021   Abscess of buttock  03/02/2021   Fluid overload 03/02/2021   Multifocal pneumonia 01/13/2021   Current mild episode of major depressive disorder without prior episode ([place]) 04/09/2020   Atrial fibrillation with RVR ([place]) 04/04/2020   Non-compliance with renal dialysis ([place]) 03/21/2020   Acute CHF (congestive heart failure) ([place]) 03/12/2020   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 ([place]) 02/03/2020   Acute on chronic combined systolic and diastolic CHF (congestive heart failure) ([place]) 02/03/2020   Acute respiratory failure with hypoxia ([place]) 11/12/2019   Aortic atherosclerosis ([place]) 11/12/2019   Emphysema of lung ([place]) 11/12/2019   Volume overload 10/05/2019   Elevated troponin 05/17/2018   Anemia in ESRD (end-stage renal disease) ([place]) 05/17/2018   ESRD on dialysis ([place]) 05/26/2017   Essential hypertension 05/26/2017   Moderate protein-calorie malnutrition ([place]) 08/13/2016   Secondary hyperparathyroidism of renal origin ([place]) 08/13/2016   Tobacco abuse 08/04/2016   PCP:  Merryl Hacker, No Pharmacy:   [facility], [place] - [address] [place] [address] Phone: [phone] Fax: [phone]  [facility] [place], [place] - [address], [address] [place] [address] Phone: [phone] Fax: [phone]     Social Determinants of Health (SDOH) Interventions    Readmission Risk Interventions No flowsheet data found.

## 2021-06-04 NOTE — Progress Notes (Signed)
Initial Nutrition Assessment  DOCUMENTATION CODES:   Severe malnutrition in context of chronic illness  INTERVENTION:  -Boost Breeze po TID, each supplement provides 250 kcal and 9 grams of protein -PROSource PLUS PO 32ms BID, each supplement provides 100 kcals and 15 grams of protein  NUTRITION DIAGNOSIS:   Severe Malnutrition related to chronic illness as evidenced by severe fat depletion, severe muscle depletion, energy intake < or equal to 75% for > or equal to 1 month.  GOAL:   Patient will meet greater than or equal to 90% of their needs  MONITOR:   PO intake, Supplement acceptance, Diet advancement, Labs, Weight trends, I & O's  REASON FOR ASSESSMENT:   Consult Assessment of nutrition requirement/status  ASSESSMENT:   Pt with a PMH significant for ESRD on HD, COPD, chronic respiratory failure on 2L O2, medical noncompliance, EtOH use disorder, HTN, CHF, and tobacco abuse admitted with hypervolemia associated with anemia of chronic renal insufficiency and possible acute blood loss anemia.  Pt provided limited responses to RD questions. Pt endorses very poor appetite, eats ~1 meal/day at baseline. Unable to obtain more details at this time.   Pt currently on clear liquid diet in preparation for EGD/colonoscopy tomorrow. Will provide pt with oral nutrition supplements and monitor for diet advancement.    Last HD 9/12 net UF 4L EDW 67 kg Current weight: 77.7 kg  Medications: dulcolax, phoslo, sensipar, reglan, protonix Labs: BUN 91 (H), Cr 13.06 (H)  NUTRITION - FOCUSED PHYSICAL EXAM:  Flowsheet Row Most Recent Value  Orbital Region Moderate depletion  Upper Arm Region Severe depletion  Thoracic and Lumbar Region Severe depletion  Buccal Region Severe depletion  Temple Region Severe depletion  Clavicle Bone Region Severe depletion  Clavicle and Acromion Bone Region Severe depletion  Scapular Bone Region Severe depletion  Dorsal Hand Severe depletion  Patellar  Region Severe depletion  Anterior Thigh Region Severe depletion  Posterior Calf Region Severe depletion  Edema (RD Assessment) None  Hair Reviewed  Eyes Reviewed  Mouth Reviewed  Skin Reviewed  Nails Reviewed       Diet Order:   Diet Order             Diet NPO time specified  Diet effective 0500 tomorrow           Diet clear liquid Room service appropriate? Yes; Fluid consistency: Thin  Diet effective now                   EDUCATION NEEDS:   No education needs have been identified at this time  Skin:  Skin Assessment: Reviewed RN Assessment  Last BM:  PTA  Height:   Ht Readings from Last 1 Encounters:  06/02/21 '5\' 4"'$  (1.626 m)    Weight:   Wt Readings from Last 1 Encounters:  06/02/21 77.7 kg     BMI:  Body mass index is 29.4 kg/m.  Estimated Nutritional Needs:   Kcal:  1900-2100  Protein:  95-105 grams  Fluid:  1L + UOP    ALarkin Ina MS, RD, LDN (she/her/hers) RD pager number and weekend/on-call pager number located in [facility]

## 2021-06-04 NOTE — Consult Note (Addendum)
[facility] Renal Consultation Note    Indication for Consultation:  Management of ESRD/hemodialysis; anemia, hypertension/volume and secondary hyperparathyroidism  PCP:Pcp, No  HPI: Jordan [name] is a 55 y.o. male with ESRD on HD MWF at [facility]. His past medical history is significant for COPD, chronic respiratory failure (on 2L O2), tobacco use, alcohol dependence, HTN, chronic diastolic heart failure, paroxysmal atrial fibrillation, anemia, and medical non-compliance.  Patient presents back to the ED for ongoing SOB. Reviewed previous notes. Of note, patient has had several admissions c/o SOB and in volume overload secondary to missed HD treatments. Patient signed out AMA during recent hospitalization for HD non-compliance. Last outpatient HD treatment on 04/26/21 where he signed off early. He presents with a pruritic rash concerning for scabies. Hgb dropped 6.7 yesterday-s/p 1 unit PRBCs-GI following. Cardiology previously consulted for w/u pericardial effusion seen on CXR. ECHO performed which showed no evidence of tamponade. Patient received HD on admit overnight. Plan for HD today.  Past Medical History:  Diagnosis Date   Anemia    Aortic atherosclerosis ([place]) 11/12/2019   ED (erectile dysfunction)    Emphysema of lung ([place]) 11/12/2019   ESRD on hemodialysis ([place])    M/W/F in [place], [place]   ETOH abuse    History of blood transfusion    Hypertension    Wears dentures    Past Surgical History:  Procedure Laterality Date   BASCILIC VEIN TRANSPOSITION Left 08/07/2016   Procedure: LEFT BASILIC VEIN TRANSPOSITION;  Surgeon: Angelia Mould, MD;  Location: [place];  Service: Vascular;  Laterality: Left;   CLOSED REDUCTION NASAL FRACTURE N/A 08/11/2019   Procedure: CLOSED REDUCTION NASAL FRACTURE WITH STABILIZATION;  Surgeon: Irene Limbo, MD;  Location: [place];  Service: Plastics;  Laterality: N/A;   COLONOSCOPY N/A 08/12/2016   Procedure:  COLONOSCOPY;  Surgeon: Otis Brace, MD;  Location: [place];  Service: Gastroenterology;  Laterality: N/A;   ESOPHAGOGASTRODUODENOSCOPY N/A 08/12/2016   Procedure: ESOPHAGOGASTRODUODENOSCOPY (EGD);  Surgeon: Otis Brace, MD;  Location: [place];  Service: Gastroenterology;  Laterality: N/A;   INSERTION OF DIALYSIS CATHETER N/A 08/07/2016   Procedure: INSERTION OF TUNNELED DIALYSIS CATHETER;  Surgeon: Angelia Mould, MD;  Location: [place];  Service: Vascular;  Laterality: N/A;   LIGATION OF ARTERIOVENOUS  FISTULA Left 09/12/2016   Procedure: BANDING OF LEFT  ARTERIOVENOUS  FISTULA;  Surgeon: Angelia Mould, MD;  Location: [place];  Service: Vascular;  Laterality: Left;   LOWER EXTREMITY INTERVENTION Right 12/02/2018   Procedure: LOWER EXTREMITY INTERVENTION;  Surgeon: Algernon Huxley, MD;  Location: [facility];  Service: Cardiovascular;  Laterality: Right;   Family History  Problem Relation Age of Onset   Diabetes Mother    Kidney failure Mother    Healthy Father    Kidney failure Brother    Healthy Sister    Kidney disease Daughter    Post-traumatic stress disorder Neg Hx    Bladder Cancer Neg Hx    Kidney cancer Neg Hx    Social History:  reports that he has been smoking cigarettes. He has a 20.00 pack-year smoking history. He has never used smokeless tobacco. He reports that he does not currently use alcohol after a past usage of about 21.0 standard drinks per week. He reports that he does not use drugs. Allergies  Allergen Reactions   Lisinopril Swelling   Prior to Admission medications   Medication Sig Start Date End Date Taking? Authorizing Provider  albuterol (VENTOLIN HFA)  108 (90 Base) MCG/ACT inhaler Inhale 2 puffs into the lungs every 4 (four) hours as needed for wheezing or shortness of breath. 05/04/21   Aline August, MD  calcium acetate (PHOSLO) 667 MG capsule Take 2 capsules (1,334 mg total) by mouth 3 (three) times daily with meals.  05/23/21   Naaman Plummer, MD  cinacalcet (SENSIPAR) 30 MG tablet Take 1 tablet (30 mg total) by mouth daily with breakfast. 05/04/21   Aline August, MD  epoetin alfa (EPOGEN) 4000 UNIT/ML injection Inject 1 mL (4,000 Units total) into the vein every Monday, Wednesday, and Friday with hemodialysis. 06/22/20   Enzo Bi, MD  guaiFENesin (MUCINEX) 600 MG 12 hr tablet Take 1 tablet (600 mg total) by mouth 2 (two) times daily. 05/04/21   Aline August, MD  hydrALAZINE (APRESOLINE) 50 MG tablet Take 50 mg by mouth 3 (three) times daily.    [provider]  hydrOXYzine (ATARAX/VISTARIL) 25 MG tablet Take 1 tablet (25 mg total) by mouth 3 (three) times daily. 05/04/21   Aline August, MD  Lidocaine 3 % CREA Apply 1 application topically 3 (three) times daily as needed (itching). 05/23/21   Naaman Plummer, MD  thiamine 100 MG tablet Take 1 tablet (100 mg total) by mouth daily. 03/11/21   Lorella Nimrod, MD  triamcinolone cream (KENALOG) 0.1 % Apply 1 application topically 2 (two) times daily. 05/23/21   Naaman Plummer, MD   Current Facility-Administered Medications  Medication Dose Route Frequency Provider Last Rate Last Admin   acetaminophen (TYLENOL) tablet 650 mg  650 mg Oral Q6H PRN Karmen Bongo, MD   650 mg at 06/03/21 2116   Or   acetaminophen (TYLENOL) suppository 650 mg  650 mg Rectal Q6H PRN Karmen Bongo, MD       albuterol (PROVENTIL) (2.5 MG/3ML) 0.083% nebulizer solution 3 mL  3 mL Inhalation Q4H PRN Karmen Bongo, MD       bisacodyl (DULCOLAX) EC tablet 20 mg  20 mg Oral Once Gribbin, Sarah J, PA-C       calcium acetate (PHOSLO) capsule 1,334 mg  1,334 mg Oral TID WC Karmen Bongo, MD   1,334 mg at 06/03/21 1702   calcium carbonate (dosed in mg elemental calcium) suspension 500 mg of elemental calcium  500 mg of elemental calcium Oral Q6H PRN Karmen Bongo, MD       camphor-menthol [facility]) lotion 1 application  1 application Topical W0J PRN Karmen Bongo, MD       And    hydrOXYzine (ATARAX/VISTARIL) tablet 25 mg  25 mg Oral Q8H PRN Karmen Bongo, MD   25 mg at 06/03/21 2116   Chlorhexidine Gluconate Cloth 2 % PADS 6 each  6 each Topical Q0600 Penninger, Ria Comment, [place]   6 each at 06/03/21 0522   Chlorhexidine Gluconate Cloth 2 % PADS 6 each  6 each Topical Q0600 Claudia Desanctis, MD       cinacalcet [facility]) tablet 30 mg  30 mg Oral Q breakfast Karmen Bongo, MD   30 mg at 06/04/21 1010   docusate sodium (ENEMEEZ) enema 283 mg  1 enema Rectal PRN Karmen Bongo, MD       feeding supplement (NEPRO CARB STEADY) liquid 237 mL  237 mL Oral TID PRN Karmen Bongo, MD       guaiFENesin (MUCINEX) 12 hr tablet 600 mg  600 mg Oral BID Karmen Bongo, MD   600 mg at 06/04/21 1010   hydrALAZINE (APRESOLINE) injection 5 mg  5  mg Intravenous Q4H PRN Karmen Bongo, MD       hydrALAZINE (APRESOLINE) tablet 50 mg  50 mg Oral TID Karmen Bongo, MD   50 mg at 06/04/21 1010   hydrOXYzine (ATARAX/VISTARIL) tablet 25 mg  25 mg Oral TID Karmen Bongo, MD   25 mg at 06/04/21 1010   metoCLOPramide (REGLAN) injection 10 mg  10 mg Intravenous Q6H Gribbin, Sarah J, PA-C       nicotine (NICODERM CQ - dosed in mg/24 hours) patch 14 mg  14 mg Transdermal Daily Karmen Bongo, MD   14 mg at 06/04/21 1010   ondansetron (ZOFRAN) tablet 4 mg  4 mg Oral Q6H PRN Karmen Bongo, MD       Or   ondansetron [facility]) injection 4 mg  4 mg Intravenous Q6H PRN Karmen Bongo, MD       pantoprazole (PROTONIX) injection 40 mg  40 mg Intravenous Q12H Shelly Coss, MD   40 mg at 06/04/21 1010   peg 3350 powder (MOVIPREP) kit 100 g  0.5 kit Oral Once [facility]       And   peg 3350 powder (MOVIPREP) kit 100 g  0.5 kit Oral Once Mignon Pine, RPH       sodium chloride flush (NS) 0.9 % injection 3 mL  3 mL Intravenous Q12H Karmen Bongo, MD   3 mL at 06/04/21 1010   sorbitol 70 % solution 30 mL  30 mL Oral PRN Karmen Bongo, MD       zolpidem Lorrin Mais) tablet 5 mg  5 mg  Oral QHS PRN Karmen Bongo, MD   5 mg at 06/03/21 2116   Labs: Basic Metabolic Panel: Recent Labs  Lab 05/29/21 0639 06/02/21 1125 06/02/21 1152 06/03/21 0624 06/04/21 0338  NA 142 138 138 142 138  K 5.8* 5.1 4.9 3.8 4.6  CL 103 99 103 102 98  CO2 18* 18*  --  25 21*  GLUCOSE 183* 72 70 99 77  BUN 132* 121* >130* 86* 91*  CREATININE 22.27* 16.93* 17.20* 12.82* 13.06*  CALCIUM 8.8* 9.6  --  9.0 8.9  PHOS 9.4*  --   --   --   --    Liver Function Tests: Recent Labs  Lab 05/29/21 0639 06/02/21 1125  AST 15 17  ALT 9 13  ALKPHOS 100 87  BILITOT 1.4* 1.5*  PROT 8.1 7.6  ALBUMIN 3.5 3.4*   No results for input(s): LIPASE, AMYLASE in the last 168 hours. No results for input(s): AMMONIA in the last 168 hours. CBC: Recent Labs  Lab 05/28/21 1747 05/29/21 0639 06/02/21 1125 06/02/21 1152 06/03/21 0624 06/04/21 0338  WBC 9.6 9.4 9.2  --  8.4 8.8  NEUTROABS  --  9.0* 7.9*  --   --  7.7  HGB 6.9* 8.3* 7.7* 8.5* 6.7* 8.3*  HCT 22.2* 26.1* 24.9* 25.0* 21.5* 25.9*  MCV 104.2* 98.9 101.6*  --  99.5 97.4  PLT 150 143* 106*  --  96* 92*   Cardiac Enzymes: No results for input(s): CKTOTAL, CKMB, CKMBINDEX, TROPONINI in the last 168 hours. CBG: No results for input(s): GLUCAP in the last 168 hours. Iron Studies:  Recent Labs    06/03/21 0624  IRON 63  TIBC 298  FERRITIN 688*   Studies/Results: ECHOCARDIOGRAM LIMITED  Result Date: 06/03/2021    ECHOCARDIOGRAM LIMITED REPORT   Patient Name:   Jordan [name] Date of Exam: 06/03/2021 Medical Rec #:  [id number]  Height:       64.0 in Accession #:    [id number]       Weight:       171.3 lb Date of Birth:  01-08-1966        BSA:          1.832 m Patient Age:    56 years         BP:           145/91 mmHg Patient Gender: M                HR:           99 bpm. Exam Location:  Inpatient Procedure: Limited Echo, Cardiac Doppler and Color Doppler STAT ECHO Indications:    I31.3 Pericardial effusion (noninflammatory)  History:         Patient has prior history of Echocardiogram examinations, most                 recent 06/02/2021. Signs/Symptoms:Chest Pain and Dyspnea; Risk                 Factors:Current Smoker. ESRD. Recent covid infection.                 Pericardial effusion. ETOH.  Sonographer:    [place] Referring Phys: [id number] AMRIT ADHIKARI IMPRESSIONS  1. Limited echo for pericardial effusion  2. Left ventricular ejection fraction, by estimation, is 45 to 50%. The left ventricle has mildly decreased function. The left ventricle demonstrates global hypokinesis. There is the interventricular septum is flattened in systole and diastole, consistent with right ventricular pressure and volume overload.  3. Pericardial effusion measures up to 1.5 cm. Moderate pericardial effusion. The pericardial effusion is circumferential. There is no evidence of cardiac tamponade.  4. The mitral valve is degenerative. Moderate to severe mitral valve regurgitation.  5. The tricuspid valve is degenerative. Tricuspid valve regurgitation is moderate.  6. The aortic valve is tricuspid.  7. Right ventricular systolic function is normal. The right ventricular size is normal. There is severely elevated pulmonary artery systolic pressure. The estimated right ventricular systolic pressure is 75.9 mmHg.  8. The inferior vena cava is dilated in size with <50% respiratory variability, suggesting right atrial pressure of 15 mmHg.  9. Left atrial size was moderately dilated. 10. Right atrial size was mildly dilated. Comparison(s): Changes from prior study are noted. 06/03/2021: LVEF 40-45%, small pericardial effusion, no tamponade. The pericardial effusion appears larger today then by echo yesterday. Cannot exclude clinical tamponade given degree of pulmonary hypertension. Clinical correlation is advised. Conclusion(s)/Recommendation(s): Due to the above findings, cardiology consult is recommended. Critical findings reported to Dr. Veverly Fells and acknowledged at  2:23 pm. FINDINGS  Left Ventricle: Left ventricular ejection fraction, by estimation, is 45 to 50%. The left ventricle has mildly decreased function. The left ventricle demonstrates global hypokinesis. The interventricular septum is flattened in systole and diastole, consistent with right ventricular pressure and volume overload. Right Ventricle: The right ventricular size is normal. No increase in right ventricular wall thickness. Right ventricular systolic function is normal. There is severely elevated pulmonary artery systolic pressure. The tricuspid regurgitant velocity is 3.48 m/s, and with an assumed right atrial pressure of 15 mmHg, the estimated right ventricular systolic pressure is 16.3 mmHg. Left Atrium: Left atrial size was moderately dilated. Right Atrium: Right atrial size was mildly dilated. Pericardium: Pericardial effusion measures up to 1.5 cm. A moderately sized pericardial effusion is present. The pericardial effusion is circumferential.  There is no evidence of cardiac tamponade. Mitral Valve: The mitral valve is degenerative in appearance. Moderate to severe mitral valve regurgitation, with wall-impinging jet. Tricuspid Valve: The tricuspid valve is degenerative in appearance. Tricuspid valve regurgitation is moderate. Aortic Valve: The aortic valve is tricuspid. Venous: The inferior vena cava is dilated in size with less than 50% respiratory variability, suggesting right atrial pressure of 15 mmHg. IAS/Shunts: No atrial level shunt detected by color flow Doppler.  LV Volumes (MOD) LV vol d, MOD A2C: 112.0 ml LV vol d, MOD A4C: 114.0 ml LV vol s, MOD A2C: 69.9 ml LV vol s, MOD A4C: 54.9 ml LV SV MOD A2C:     42.1 ml LV SV MOD A4C:     114.0 ml LV SV MOD BP:      50.1 ml IVC IVC diam: 2.80 cm TRICUSPID VALVE TR Peak grad:   48.4 mmHg TR Vmax:        348.00 cm/s Lyman Bishop MD Electronically signed by Lyman Bishop MD Signature Date/Time: 06/03/2021/2:32:48 PM    Final     Physical  Exam: Vitals:   06/03/21 1839 06/03/21 2114 06/04/21 0446 06/04/21 1000  BP: (!) 161/49 129/83 (!) 125/95 (!) 138/92  Pulse: 100 100 99   Resp: _0 Temp: 97.8 F (36.6 C) 97.8 F (36.6 C) 97.6 F (36.4 C)   TempSrc: Oral Oral Oral   SpO2: 100% 98% 93%   Weight:      Height:         General: Ill-appearing; NAD Lungs: CTA bilaterally. No wheeze, rales or rhonchi. Breathing is unlabored. Heart: RRR. No murmur, rubs or gallops.  Abdomen: soft, nontender, active bowel sounds Lower extremities: 1+ edema noted BLLE Neuro: AAOx3. Moves all extremities spontaneously. Dialysis Access: LU AVF (+) Bruit/Thrill  Dialysis Orders:  MWF 4hrs [place] 67kg, 2K 2Ca, 400/800 LU AVF No Heparin Venofer 50qwk Mircera 23mg q2wks Last Labs: Hgb 8.3, K 4.6, Ca 8.9, Alb 3.4  Assessment/Plan: ESRD/Volume Overload 2nd Missed HD-on HD MWF. History of non-compliance with HD treatments. HD today. Ongoing discussions on importance on coming to all required treatments and monitoring fluid consumption. Pericardial Effusion-CXR showed pericardial effusion but ECHO showed (+) for small pericardial effusion no evidence of tamponade. Cardiology signed off.  Rash-patient presented with pruritic rash concerning for scabies. Permethrin previously given. Will monitor. Last PO4 level high at 9.4 (05/29/21)-Will check PO4 level in AM  Hypertension/volume  - Blood pressures controlled; doesn't appear to be in acute volume overload currently. HD today.  Anemia of CKD - Hgb up to 8.3. S/p 1 unit PRBC on 9/12. GI following-scheduled for EGD/Colonoscopy 9/14  Secondary Hyperparathyroidism -  Ca okay, last PO4 high-d/t frequent missed HD. Will resume binders once diet is resumed. PO4 in AM.  Nutrition -  CLD for now in preparation for EGD/Colonoscopy 9/14. Advance to renal diet with fluid restriction when clinically stable.  CTobie Poet NP [facility] 06/04/2021, 3:56 PM      Seen and examined independently.  Agree with note and exam as documented above by physician extender and as noted here.  General adult male in bed in no acute distress HEENT normocephalic atraumatic extraocular movements intact sclera anicteric Neck supple trachea midline Lungs clear to auscultation bilaterally normal work of breathing at rest  Heart regular rate and rhythm no rubs or gallops appreciated Abdomen soft nontender distended Extremities no edema  Psych normal mood and affect Neuro alert and oriented  x 3 provides hx and follows commands Access LUE AVF b/t  ESRD  - HD today off schedule.  Last HD here 9/11 - encouraged HD compliance   Pericardial effusion  - no tamponade  HTN optimize volume with HD  Rash - note precautions for scabies per nursing unit  Anemia CKD - PRBC on 9/12; mircera as above - not compliant so hasn't been getting. Will need to order if hasn't had recently   Secondary hyperpara Phos in am; no activated vit D listed   Claudia Desanctis, MD 06/04/2021  6:05 PM

## 2021-06-04 NOTE — Progress Notes (Signed)
   No active cardiac issues Will sign off Call for questions   Mertie Moores, MD  06/04/2021 2:56 PM    [address],  [place], [place]  [address] Phone: [phone]; Fax: [phone]

## 2021-06-05 ENCOUNTER — Observation Stay (HOSPITAL_COMMUNITY): Payer: Medicare Other | Admitting: Anesthesiology

## 2021-06-05 ENCOUNTER — Encounter (HOSPITAL_COMMUNITY)
Admission: EM | Disposition: A | Discharge status: Home Health | Payer: Self-pay | Source: Home / Self Care | Attending: Family Medicine

## 2021-06-05 ENCOUNTER — Encounter (HOSPITAL_COMMUNITY): Payer: Self-pay | Admitting: Internal Medicine

## 2021-06-05 DIAGNOSIS — K2210 Ulcer of esophagus without bleeding: Secondary | ICD-10-CM

## 2021-06-05 DIAGNOSIS — K766 Portal hypertension: Secondary | ICD-10-CM

## 2021-06-05 DIAGNOSIS — K2980 Duodenitis without bleeding: Secondary | ICD-10-CM | POA: Diagnosis not present

## 2021-06-05 DIAGNOSIS — E43 Unspecified severe protein-calorie malnutrition: Secondary | ICD-10-CM | POA: Insufficient documentation

## 2021-06-05 DIAGNOSIS — K921 Melena: Secondary | ICD-10-CM | POA: Diagnosis present

## 2021-06-05 DIAGNOSIS — Q782 Osteopetrosis: Secondary | ICD-10-CM | POA: Diagnosis not present

## 2021-06-05 DIAGNOSIS — Z992 Dependence on renal dialysis: Secondary | ICD-10-CM | POA: Diagnosis not present

## 2021-06-05 DIAGNOSIS — N2581 Secondary hyperparathyroidism of renal origin: Secondary | ICD-10-CM | POA: Diagnosis present

## 2021-06-05 DIAGNOSIS — J9611 Chronic respiratory failure with hypoxia: Secondary | ICD-10-CM | POA: Diagnosis present

## 2021-06-05 DIAGNOSIS — J439 Emphysema, unspecified: Secondary | ICD-10-CM | POA: Diagnosis present

## 2021-06-05 DIAGNOSIS — N186 End stage renal disease: Secondary | ICD-10-CM | POA: Diagnosis present

## 2021-06-05 DIAGNOSIS — D631 Anemia in chronic kidney disease: Secondary | ICD-10-CM | POA: Diagnosis present

## 2021-06-05 DIAGNOSIS — R188 Other ascites: Secondary | ICD-10-CM | POA: Diagnosis present

## 2021-06-05 DIAGNOSIS — Z8616 Personal history of COVID-19: Secondary | ICD-10-CM | POA: Diagnosis not present

## 2021-06-05 DIAGNOSIS — F1020 Alcohol dependence, uncomplicated: Secondary | ICD-10-CM | POA: Diagnosis present

## 2021-06-05 DIAGNOSIS — Z66 Do not resuscitate: Secondary | ICD-10-CM | POA: Diagnosis not present

## 2021-06-05 DIAGNOSIS — Z9115 Patient's noncompliance with renal dialysis: Secondary | ICD-10-CM | POA: Diagnosis not present

## 2021-06-05 DIAGNOSIS — I5033 Acute on chronic diastolic (congestive) heart failure: Secondary | ICD-10-CM | POA: Diagnosis present

## 2021-06-05 DIAGNOSIS — K635 Polyp of colon: Secondary | ICD-10-CM

## 2021-06-05 DIAGNOSIS — Z20822 Contact with and (suspected) exposure to covid-19: Secondary | ICD-10-CM | POA: Diagnosis present

## 2021-06-05 DIAGNOSIS — I313 Pericardial effusion (noninflammatory): Secondary | ICD-10-CM | POA: Diagnosis present

## 2021-06-05 DIAGNOSIS — E8770 Fluid overload, unspecified: Secondary | ICD-10-CM | POA: Diagnosis present

## 2021-06-05 DIAGNOSIS — D696 Thrombocytopenia, unspecified: Secondary | ICD-10-CM | POA: Diagnosis present

## 2021-06-05 DIAGNOSIS — I5082 Biventricular heart failure: Secondary | ICD-10-CM | POA: Diagnosis present

## 2021-06-05 DIAGNOSIS — D62 Acute posthemorrhagic anemia: Secondary | ICD-10-CM | POA: Diagnosis not present

## 2021-06-05 DIAGNOSIS — I132 Hypertensive heart and chronic kidney disease with heart failure and with stage 5 chronic kidney disease, or end stage renal disease: Secondary | ICD-10-CM | POA: Diagnosis present

## 2021-06-05 DIAGNOSIS — K2211 Ulcer of esophagus with bleeding: Secondary | ICD-10-CM | POA: Diagnosis present

## 2021-06-05 DIAGNOSIS — N289 Disorder of kidney and ureter, unspecified: Secondary | ICD-10-CM | POA: Diagnosis not present

## 2021-06-05 HISTORY — PX: COLONOSCOPY WITH PROPOFOL: SHX5780

## 2021-06-05 HISTORY — PX: ESOPHAGOGASTRODUODENOSCOPY (EGD) WITH PROPOFOL: SHX5813

## 2021-06-05 HISTORY — PX: POLYPECTOMY: SHX5525

## 2021-06-05 LAB — CBC
HCT: 23.9 % — ABNORMAL LOW (ref 39.0–52.0)
Hemoglobin: 7.6 g/dL — ABNORMAL LOW (ref 13.0–17.0)
MCH: 31.1 pg (ref 26.0–34.0)
MCHC: 31.8 g/dL (ref 30.0–36.0)
MCV: 98 fL (ref 80.0–100.0)
Platelets: 73 10*3/uL — ABNORMAL LOW (ref 150–400)
RBC: 2.44 MIL/uL — ABNORMAL LOW (ref 4.22–5.81)
RDW: 17 % — ABNORMAL HIGH (ref 11.5–15.5)
WBC: 9.5 10*3/uL (ref 4.0–10.5)
nRBC: 0 % (ref 0.0–0.2)

## 2021-06-05 SURGERY — COLONOSCOPY WITH PROPOFOL
Anesthesia: Monitor Anesthesia Care

## 2021-06-05 MED ORDER — DOCUSATE SODIUM 100 MG PO CAPS: 100.0000 mg | ORAL_CAPSULE | Freq: Two times a day (BID) | ORAL | Status: DC

## 2021-06-05 MED ORDER — PROPOFOL 10 MG/ML IV BOLUS
INTRAVENOUS | Status: DC | PRN
  Administered 2021-06-05: 40 mg via INTRAVENOUS

## 2021-06-05 MED ORDER — HYDRALAZINE HCL 50 MG PO TABS: 50.0000 mg | ORAL_TABLET | Freq: Three times a day (TID) | ORAL | 3 refills | Status: DC

## 2021-06-05 MED ORDER — PROPOFOL 500 MG/50ML IV EMUL
INTRAVENOUS | Status: DC | PRN
Start: 2021-06-05 — End: 2021-06-05
  Administered 2021-06-05: 100 ug/kg/min via INTRAVENOUS

## 2021-06-05 MED ORDER — ALBUTEROL SULFATE HFA 108 (90 BASE) MCG/ACT IN AERS: 2.0000 | INHALATION_SPRAY | RESPIRATORY_TRACT | 3 refills | Status: AC | PRN

## 2021-06-05 MED ORDER — PHENYLEPHRINE HCL-NACL 20-0.9 MG/250ML-% IV SOLN
INTRAVENOUS | Status: DC | PRN
  Administered 2021-06-05: 50 ug/min via INTRAVENOUS

## 2021-06-05 MED ORDER — DARBEPOETIN ALFA 200 MCG/0.4ML IJ SOSY
200.0000 ug | PREFILLED_SYRINGE | Freq: Once | INTRAMUSCULAR | Status: AC
  Administered 2021-06-05: 200 ug via SUBCUTANEOUS
  Filled 2021-06-05: qty 0.4

## 2021-06-05 MED ORDER — FERROUS SULFATE 325 (65 FE) MG PO TABS
325.0000 mg | ORAL_TABLET | Freq: Two times a day (BID) | ORAL | Status: DC
  Administered 2021-06-05: 325 mg via ORAL
  Filled 2021-06-05: qty 1

## 2021-06-05 MED ORDER — LIDOCAINE 2% (20 MG/ML) 5 ML SYRINGE
INTRAMUSCULAR | Status: DC | PRN
  Administered 2021-06-05: 80 mg via INTRAVENOUS

## 2021-06-05 MED ORDER — CALCIUM ACETATE (PHOS BINDER) 667 MG PO CAPS: 1334.0000 mg | ORAL_CAPSULE | Freq: Three times a day (TID) | ORAL | 2 refills | Status: AC

## 2021-06-05 MED ORDER — DOCUSATE SODIUM 100 MG PO CAPS: 100.0000 mg | ORAL_CAPSULE | Freq: Two times a day (BID) | ORAL | 0 refills | Status: DC

## 2021-06-05 MED ORDER — PHENYLEPHRINE HCL (PRESSORS) 10 MG/ML IV SOLN
INTRAVENOUS | Status: DC | PRN
  Administered 2021-06-05 (×2): 80 ug via INTRAVENOUS
  Administered 2021-06-05: 240 ug via INTRAVENOUS

## 2021-06-05 MED ORDER — PANTOPRAZOLE SODIUM 20 MG PO TBEC: 20.0000 mg | DELAYED_RELEASE_TABLET | Freq: Two times a day (BID) | ORAL | 1 refills | Status: DC

## 2021-06-05 MED ORDER — CINACALCET HCL 30 MG PO TABS: 30.0000 mg | ORAL_TABLET | Freq: Every day | ORAL | 2 refills | Status: DC

## 2021-06-05 MED ORDER — SODIUM CHLORIDE 0.9 % IV SOLN
250.0000 mg | Freq: Every day | INTRAVENOUS | Status: DC
  Administered 2021-06-05: 250 mg via INTRAVENOUS
  Filled 2021-06-05: qty 20

## 2021-06-05 MED ORDER — EPHEDRINE SULFATE 50 MG/ML IJ SOLN
INTRAMUSCULAR | Status: DC | PRN
  Administered 2021-06-05: 5 mg via INTRAVENOUS

## 2021-06-05 MED ORDER — SODIUM CHLORIDE 0.9 % IV SOLN
INTRAVENOUS | Status: DC
  Administered 2021-06-05: 500 mL via INTRAVENOUS

## 2021-06-05 MED ORDER — FERROUS SULFATE 325 (65 FE) MG PO TABS: 325.0000 mg | ORAL_TABLET | Freq: Two times a day (BID) | ORAL | 3 refills | Status: AC

## 2021-06-05 NOTE — Progress Notes (Signed)
PT Cancellation Note  Patient Details Name: FARDIN MALSON MRN: [id number] DOB: 25-Sep-1965   Cancelled Treatment:    Reason Eval/Treat Not Completed: Other (comment). Prior to entry for PT evaluation Dr. Loleta Books reports no need for PT evaluation at this time. Please re-consult PT services if any mobility concerns arise.   Zenaida Niece 06/05/2021, 1:45 PM

## 2021-06-05 NOTE — Progress Notes (Signed)
Discharge instructions given to patient.  Education emphasized on importance of following through doctors' appointments, HD appointments, heart healthy/low sodium diet, and signs of infection to watch for.  Reiterated appointment for HD tomorrow 9/15 and transport arranged at 9am.  Patient verbalized understanding.  Encouraged to call the doctor for concerns. Picked up by Melburn Popper arranged by CM.  Discharged home.

## 2021-06-05 NOTE — Interval H&P Note (Signed)
History and Physical Interval Note:  06/05/2021 9:04 AM  Jordan [name]  has presented today for surgery, with the diagnosis of Anemia.  FOBT positive..  The various methods of treatment have been discussed with the patient and family. After consideration of risks, benefits and other options for treatment, the patient has consented to  Procedure(s): COLONOSCOPY WITH PROPOFOL (N/A) ESOPHAGOGASTRODUODENOSCOPY (EGD) WITH PROPOFOL (N/A) as a surgical intervention.  The patient's history has been reviewed, patient examined, no change in status, stable for surgery.  I have reviewed the patient's chart and labs.  Questions were answered to the patient's satisfaction.     Sharyn Creamer

## 2021-06-05 NOTE — Anesthesia Postprocedure Evaluation (Signed)
Anesthesia Post Note  Patient: Jordan [name]  Procedure(s) Performed: COLONOSCOPY WITH PROPOFOL ESOPHAGOGASTRODUODENOSCOPY (EGD) WITH PROPOFOL POLYPECTOMY     Patient location during evaluation: PACU Anesthesia Type: MAC Level of consciousness: awake and alert Pain management: pain level controlled Vital Signs Assessment: post-procedure vital signs reviewed and stable Respiratory status: spontaneous breathing, nonlabored ventilation and respiratory function stable Cardiovascular status: blood pressure returned to baseline and stable Postop Assessment: no apparent nausea or vomiting Anesthetic complications: no   No notable events documented.  Last Vitals:  Vitals:   06/05/21 1057 06/05/21 1104  BP: (!) 126/91 112/66  Pulse: 88 87  Resp: 20 17  Temp:    SpO2: 100% 97%    Last Pain:  Vitals:   06/05/21 1038  TempSrc: Axillary  PainSc: 0-No pain                 Pervis Hocking

## 2021-06-05 NOTE — Plan of Care (Signed)
  Problem: Education: Goal: Knowledge of General Education information will improve Description: Including pain rating scale, medication(s)/side effects and non-pharmacologic comfort measures Outcome: Completed/Met   Problem: Health Behavior/Discharge Planning: Goal: Ability to manage health-related needs will improve Outcome: Completed/Met   Problem: Clinical Measurements: Goal: Ability to maintain clinical measurements within normal limits will improve Outcome: Completed/Met Goal: Will remain free from infection Outcome: Completed/Met Goal: Diagnostic test results will improve Outcome: Completed/Met Goal: Respiratory complications will improve Outcome: Completed/Met Goal: Cardiovascular complication will be avoided Outcome: Completed/Met   Problem: Activity: Goal: Risk for activity intolerance will decrease Outcome: Completed/Met   Problem: Nutrition: Goal: Adequate nutrition will be maintained Outcome: Completed/Met   Problem: Coping: Goal: Level of anxiety will decrease Outcome: Completed/Met   Problem: Elimination: Goal: Will not experience complications related to bowel motility Outcome: Completed/Met Goal: Will not experience complications related to urinary retention Outcome: Completed/Met   Problem: Pain Managment: Goal: General experience of comfort will improve Outcome: Completed/Met   Problem: Safety: Goal: Ability to remain free from injury will improve Outcome: Completed/Met   Problem: Skin Integrity: Goal: Risk for impaired skin integrity will decrease Outcome: Completed/Met   Problem: Education: Goal: Knowledge of disease and its progression will improve Outcome: Completed/Met Goal: Individualized Educational Video(s) Outcome: Completed/Met   Problem: Fluid Volume: Goal: Compliance with measures to maintain balanced fluid volume will improve Outcome: Completed/Met   Problem: Health Behavior/Discharge Planning: Goal: Ability to manage  health-related needs will improve Outcome: Completed/Met   Problem: Nutritional: Goal: Ability to make healthy dietary choices will improve Outcome: Completed/Met   Problem: Clinical Measurements: Goal: Complications related to the disease process, condition or treatment will be avoided or minimized Outcome: Completed/Met   

## 2021-06-05 NOTE — Anesthesia Preprocedure Evaluation (Addendum)
Anesthesia Evaluation  Patient identified by MRN, date of birth, ID band Patient awake    Reviewed: Allergy & Precautions, NPO status , Patient's Chart, lab work & pertinent test results  Airway Mallampati: III  TM Distance: >3 FB Neck ROM: Full    Dental  (+) Edentulous Upper, Edentulous Lower   Pulmonary shortness of breath and with exertion, COPD (2LPM [place] ),  COPD inhaler and oxygen dependent, Current Smoker and Patient abstained from smoking.,  20 pack year history  4-5cigg/d   Pulmonary exam normal breath sounds clear to auscultation       Cardiovascular hypertension, Pt. on medications pulmonary hypertension (severe pHTN)+CHF (LVEF 45-50%)  Normal cardiovascular exam+ Valvular Problems/Murmurs (mod-severe MR) MR  Rhythm:Regular Rate:Normal  Echo 06/03/21: 1. Limited echo for pericardial effusion  2. Left ventricular ejection fraction, by estimation, is 45 to 50%. The  left ventricle has mildly decreased function. The left ventricle  demonstrates global hypokinesis. There is the interventricular septum is  flattened in systole and diastole,  consistent with right ventricular pressure and volume overload.  3. Pericardial effusion measures up to 1.5 cm. Moderate pericardial  effusion. The pericardial effusion is circumferential. There is no  evidence of cardiac tamponade.  4. The mitral valve is degenerative. Moderate to severe mitral valve  regurgitation.  5. The tricuspid valve is degenerative. Tricuspid valve regurgitation is  moderate.  6. The aortic valve is tricuspid.  7. Right ventricular systolic function is normal. The right ventricular  size is normal. There is severely elevated pulmonary artery systolic  pressure. The estimated right ventricular systolic pressure is 00.4 mmHg.  8. The inferior vena cava is dilated in size with <50% respiratory  variability, suggesting right atrial pressure of 15 mmHg.  9.  Left atrial size was moderately dilated.  10. Right atrial size was mildly dilated.    Neuro/Psych PSYCHIATRIC DISORDERS Depression negative neurological ROS     GI/Hepatic (+) Cirrhosis  (plt 73)    substance abuse  alcohol use, GIB   Endo/Other  negative endocrine ROS  Renal/GU ESRF and DialysisRenal diseaseHD MWF   negative genitourinary   Musculoskeletal negative musculoskeletal ROS (+)   Abdominal   Peds  Hematology  (+) Blood dyscrasia, anemia , ABLA 2/2 GIB H/H 7.6/23.9, plt 73   Anesthesia Other Findings   Reproductive/Obstetrics negative OB ROS                           Anesthesia Physical Anesthesia Plan  ASA: 4  Anesthesia Plan: MAC   Post-op Pain Management:    Induction:   PONV Risk Score and Plan: 2 and Propofol infusion and TIVA  Airway Management Planned: Natural Airway and Simple Face Mask  Additional Equipment: None  Intra-op Plan:   Post-operative Plan:   Informed Consent: I have reviewed the patients History and Physical, chart, labs and discussed the procedure including the risks, benefits and alternatives for the proposed anesthesia with the patient or authorized representative who has indicated his/her understanding and acceptance.   Patient has DNR.  Discussed DNR with patient and Suspend DNR.   Dental advisory given  Plan Discussed with: CRNA  Anesthesia Plan Comments:        Anesthesia Quick Evaluation

## 2021-06-05 NOTE — Progress Notes (Addendum)
Attending MD has requested that pt receive out-pt HD treatment tomorrow. Pt for d/c today. Spoke to RN, Merchant navy officer, at [employer] ([place] who is going to check the schedule to see if that is possible and return call. Will await her response.Discussed case with Loma Sousa with [facility].   Melven Sartorius Renal Navigator  [phone]  Addendum at 2:49 pm: Spoke to Neoma Laming, RN, at [facility]. Pt can receive treatment tomorrow at clinic. Pt will need to arrive at 9:05 with a start time of 9:25. Deborah aware pt is supposed to d/c today and resume tomorrow. Will fax documents for continuation of care. Spoke to pt via phone to advise him of appointments for tomorrow. Pt agreeable to plan. Provided update to MD, RN, CM, and Courtney with [facility].

## 2021-06-05 NOTE — Transfer of Care (Signed)
Immediate Anesthesia Transfer of Care Note  Patient: Jordan [name]  Procedure(s) Performed: COLONOSCOPY WITH PROPOFOL ESOPHAGOGASTRODUODENOSCOPY (EGD) WITH PROPOFOL POLYPECTOMY  Patient Location: PACU  Anesthesia Type:MAC  Level of Consciousness: drowsy and patient cooperative  Airway & Oxygen Therapy: Patient Spontanous Breathing and Patient connected to face mask oxygen  Post-op Assessment: Post -op Vital signs reviewed and stable  Post vital signs: Reviewed and stable patient on 10L Face mask, and on 75 mcg/min of neo.  MD aware  Last Vitals:  Vitals Value Taken Time  BP 104/72 06/05/21 1038  Temp    Pulse    Resp 20 06/05/21 1042  SpO2    Vitals shown include unvalidated device data.  Last Pain:  Vitals:   06/05/21 0854  TempSrc: Temporal  PainSc: 0-No pain      Patients Stated Pain Goal: 0 (16/07/37 1062)  Complications: No notable events documented.

## 2021-06-05 NOTE — Discharge Summary (Signed)
Physician Discharge Summary  Jordan [name] [id number] DOB: 1966/03/04 DOA: 06/02/2021  PCP: Pcp, No  Admit date: 06/02/2021 Discharge date: 06/05/2021  Admitted From: Home  Disposition:  Home   Recommendations for Outpatient Follow-up:  Follow up with PCP in 1 week Follow up with GI Dr. Bonna Gains in 4 weeks Dr. Bonna Gains: Please follow up pending H pylori antibody and treat if positive     Home Health: Declined by patient  Equipment/Devices: None, declined by patient  Discharge Condition: Fair  CODE STATUS: FULL Diet recommendation: Renal  Brief/Interim Summary: Jordan [name] is a 55 y.o. M with ESRD MWF, COPD, chronic respiratory failure on 2L O2, medical noncompliance, alcohol use disorder, HTN, dCHF and ongoing smoking who presented with few days SOB since leaving [facility] AMA last week.  Patient admitted to [facility] last week for fluid overload due to missed HD.  Dialysis was recommended, but he stopped the session early and left AMA, subsequently skipped the next two HD sessions as an outpatient, and returned to the hospital with SOB.  In the ER, CXR showed pericardial effusion, edema.  Admitted for HD.               PRINCIPAL HOSPITAL DIAGNOSIS: Anemia    Discharge Diagnoses:   Anemia of chronic renal insufficiency Possible acute blood loss anemia Baseline Hgb appears to be 7-8 due to noncompliance with outpatient anemia treatments.  Here, it was down to 6.7 g/dL and he was noted to have FOB and he reported "dark stools".  Transfused 1 unit 9/12  EGD performed, no stigmata of recent bleeding, but noted to have esophagitis and gastritis.  H pylori Ab pending.    Take pantoprazole for 8 weeks BID then once daily.  Follow up with GI Dr. Bonna Gains in for repeat EGD in 2-3 months and colonoscopy in 1 year with 2 day prep.    Started on iron.     ESRD  Nicotine dependence Cessation recommended  COPD  Chronic respiratory failure  Uses 2L O2 at home,  no respiratory disease noted here.  Pericardial effusion Due to missed HD.  No clinical tamponade physiology.  Cardiology were consulted, they recommended adherence to HD schedule, no further work up.  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            Discharge Instructions   Allergies as of 06/05/2021       Reactions   Lisinopril Swelling        Medication List     STOP taking these medications    guaiFENesin 600 MG 12 hr tablet Commonly known as: MUCINEX   hydrOXYzine 25 MG tablet Commonly known as: ATARAX/VISTARIL   Lidocaine 3 % Crea   thiamine 100 MG tablet   triamcinolone cream 0.1 % Commonly known as: KENALOG       TAKE these medications    albuterol 108 (90 Base) MCG/ACT inhaler Commonly known as: VENTOLIN HFA Inhale 2 puffs into the lungs every 4 (four) hours as needed for wheezing or shortness of breath.   calcium acetate 667 MG capsule Commonly known as: PHOSLO Take 2 capsules (1,334 mg total) by mouth 3 (three) times daily with meals.   cinacalcet 30 MG tablet Commonly known as: SENSIPAR  Take 1 tablet (30 mg total) by mouth daily with breakfast.   docusate sodium 100 MG capsule Commonly known as: COLACE Take 1 capsule (100 mg total) by mouth 2 (two) times daily.   epoetin alfa 4000 UNIT/ML injection Commonly known as: EPOGEN Inject 1 mL (4,000 Units total) into the vein every Monday, Wednesday, and Friday with hemodialysis.   ferrous sulfate 325 (65 FE) MG tablet Take 1 tablet (325 mg total) by mouth 2 (two) times daily with a meal.   hydrALAZINE 50 MG tablet Commonly known as: APRESOLINE Take 1 tablet (50 mg total) by mouth 3 (three)  times daily.   pantoprazole 20 MG tablet Commonly known as: Protonix Take 1 tablet (20 mg total) by mouth 2 (two) times daily before a meal.               Durable Medical Equipment  (From admission, onward)           Start     Ordered   06/05/21 1402  For home use only DME Walker rolling  Once       Question Answer Comment  Walker: With [place] Wheels   Patient needs a walker to treat with the following condition Gait instability      06/05/21 1402            Follow-up Information     [facility], [facility] Follow up.   Specialty: Home Health Services Why: Someone will call you to schedule first home visit. Contact information: [place] [address] [phone]         Transportation Follow up.   Why: Transportation will be provided on your dialysis days. You will be picked up tomorrow 09/15 at 0900 for extra dialysis. Contact information: Medicaid transportation        Meals on Wheels Follow up.   Why: Contact to apply for meal delivery.               Allergies  Allergen Reactions   Lisinopril Swelling    Consultations: Nephrology Gastroenterology   Procedures/Studies: CT Abdomen Pelvis Wo Contrast  Result Date: 05/10/2021 CLINICAL DATA:  Abdominal distension. End-stage renal failure on dialysis. Congestive heart failure. EXAM: CT ABDOMEN AND PELVIS WITHOUT CONTRAST TECHNIQUE: Multidetector CT imaging of the abdomen and pelvis was performed following the standard protocol without IV contrast. COMPARISON:  04/26/2021 FINDINGS: Lower chest: Stable small pericardial effusion. Mild uniform ground-glass opacity in both lung bases is suspicious for mild pulmonary edema. Hepatobiliary: No mass visualized on this unenhanced exam. Gallbladder is unremarkable. No evidence of biliary ductal dilatation. Pancreas: No mass or inflammatory process visualized on this unenhanced exam. Spleen:  Within normal limits in size.  Adrenals/Urinary tract: Severe bilateral renal atrophy again noted. Extensive renal vascular calcification again noted. No evidence of urolithiasis or hydronephrosis. Stomach/Bowel: No evidence of obstruction, inflammatory process, or abnormal fluid collections. Normal appendix visualized. Vascular/Lymphatic: No pathologically enlarged lymph nodes identified. No evidence of abdominal aortic aneurysm. Aortic atherosclerotic calcification noted. Reproductive:  No mass or other significant abnormality. Other: Large amount of ascites shows no significant change. Severe diffuse body wall edema has mildly increased since prior exam. Musculoskeletal: No suspicious bone lesions identified. Diffuse osteosclerosis again seen, consistent with renal osteodystrophy. IMPRESSION: No acute findings. Anasarca, with large amount of ascites, similar to prior exam. Stable small pericardial effusion. Stable severe bilateral renal atrophy and findings of renal osteodystrophy. No evidence of hydronephrosis. Aortic Atherosclerosis (ICD10-I70.0). Electronically Signed   By:  Marlaine Hind M.D.   On: 05/10/2021 18:50   DG Chest 2 View  Result Date: 05/28/2021 CLINICAL DATA:  Shortness of breath. EXAM: CHEST - 2 VIEW COMPARISON:  05/22/2021 FINDINGS: Stable cardiac enlargement. Diffuse increase interstitial markings throughout both lungs compatible with interstitial edema. No pleural effusion identified. No airspace consolidation. IMPRESSION: Cardiac enlargement with diffuse increase interstitial markings compatible with interstitial edema. Electronically Signed   By: Kerby Moors M.D.   On: 05/28/2021 19:00   DG Chest 2 View  Result Date: 05/22/2021 CLINICAL DATA:  Chest pain. EXAM: CHEST - 2 VIEW COMPARISON:  Chest radiograph dated 05/10/2021. FINDINGS: There is cardiomegaly. No focal consolidation, pleural effusion pneumothorax. Atherosclerotic calcification the aorta. No acute osseous pathology. IMPRESSION: 1. No acute  cardiopulmonary process. 2. Cardiomegaly. Electronically Signed   By: Anner Crete M.D.   On: 05/22/2021 23:29   DG Chest 2 View  Result Date: 05/10/2021 CLINICAL DATA:  Dyspnea EXAM: CHEST - 2 VIEW COMPARISON:  Radiograph 05/04/2021, chest CT 05/03/2021 FINDINGS: Unchanged, enlarged cardiac silhouette with a globular appearance. Diffuse interstitial and bilateral airspace opacities. No large pleural effusion or visible pneumothorax. No acute osseous abnormality. Postsurgical changes involving the left upper extremity. IMPRESSION: Unchanged large heart with appearance compatible with pericardial effusion. Diffuse interstitial opacities with bilateral airspace disease compatible with mild pulmonary edema. Electronically Signed   By: Maurine Simmering M.D.   On: 05/10/2021 13:02   DG Chest Port 1 View  Result Date: 06/02/2021 CLINICAL DATA:  Shortness of breath. EXAM: PORTABLE CHEST 1 VIEW COMPARISON:  May 03, 2021, chest CT and May 28, 2021, chest x-ray. FINDINGS: Markedly enlarged and globular cardiac silhouette. Mild pulmonary vascular congestion. No pleural effusion or pneumothorax seen. Osseous structures are without acute abnormality. Soft tissues are grossly normal. IMPRESSION: 1. Markedly enlarged and globular cardiac silhouette. The appearance is highly suspicious for large pericardial effusion. Please further evaluate with cardiac echo. Cardiology consultation is also recommended. 2. Mild pulmonary vascular congestion versus peribronchial airspace consolidation such as seen in atypical pneumonia. Electronically Signed   By: Fidela Salisbury M.D.   On: 06/02/2021 13:09   ECHOCARDIOGRAM LIMITED  Result Date: 06/03/2021    ECHOCARDIOGRAM LIMITED REPORT   Patient Name:   Jordan [name] Date of Exam: 06/03/2021 Medical Rec #:  [id number]        Height:       64.0 in Accession #:    [id number]       Weight:       171.3 lb Date of Birth:  [birth year]        BSA:          1.832 m Patient Age:     11 years         BP:           145/91 mmHg Patient Gender: M                HR:           99 bpm. Exam Location:  Inpatient Procedure: Limited Echo, Cardiac Doppler and Color Doppler STAT ECHO Indications:    I31.3 Pericardial effusion (noninflammatory)  History:        Patient has prior history of Echocardiogram examinations, most                 recent 06/02/2021. Signs/Symptoms:Chest Pain and Dyspnea; Risk                 Factors:Current Smoker. ESRD. Recent covid infection.  Pericardial effusion. ETOH.  Sonographer:    [place] Referring Phys: [id number] AMRIT ADHIKARI IMPRESSIONS  1. Limited echo for pericardial effusion  2. Left ventricular ejection fraction, by estimation, is 45 to 50%. The left ventricle has mildly decreased function. The left ventricle demonstrates global hypokinesis. There is the interventricular septum is flattened in systole and diastole, consistent with right ventricular pressure and volume overload.  3. Pericardial effusion measures up to 1.5 cm. Moderate pericardial effusion. The pericardial effusion is circumferential. There is no evidence of cardiac tamponade.  4. The mitral valve is degenerative. Moderate to severe mitral valve regurgitation.  5. The tricuspid valve is degenerative. Tricuspid valve regurgitation is moderate.  6. The aortic valve is tricuspid.  7. Right ventricular systolic function is normal. The right ventricular size is normal. There is severely elevated pulmonary artery systolic pressure. The estimated right ventricular systolic pressure is [id number] mmHg.  8. The inferior vena cava is dilated in size with <50% respiratory variability, suggesting right atrial pressure of 15 mmHg.  9. Left atrial size was moderately dilated. 10. Right atrial size was mildly dilated. Comparison(s): Changes from prior study are noted. 06/03/2021: LVEF 40-45%, small pericardial effusion, no tamponade. The pericardial effusion appears larger today then by echo yesterday.  Cannot exclude clinical tamponade given degree of pulmonary hypertension. Clinical correlation is advised. Conclusion(s)/Recommendation(s): Due to the above findings, cardiology consult is recommended. Critical findings reported to Dr. Veverly Fells and acknowledged at 2:23 pm. FINDINGS  Left Ventricle: Left ventricular ejection fraction, by estimation, is 45 to 50%. The left ventricle has mildly decreased function. The left ventricle demonstrates global hypokinesis. The interventricular septum is flattened in systole and diastole, consistent with right ventricular pressure and volume overload. Right Ventricle: The right ventricular size is normal. No increase in right ventricular wall thickness. Right ventricular systolic function is normal. There is severely elevated pulmonary artery systolic pressure. The tricuspid regurgitant velocity is 3.48 m/s, and with an assumed right atrial pressure of 15 mmHg, the estimated right ventricular systolic pressure is [id number] mmHg. Left Atrium: Left atrial size was moderately dilated. Right Atrium: Right atrial size was mildly dilated. Pericardium: Pericardial effusion measures up to 1.5 cm. A moderately sized pericardial effusion is present. The pericardial effusion is circumferential. There is no evidence of cardiac tamponade. Mitral Valve: The mitral valve is degenerative in appearance. Moderate to severe mitral valve regurgitation, with wall-impinging jet. Tricuspid Valve: The tricuspid valve is degenerative in appearance. Tricuspid valve regurgitation is moderate. Aortic Valve: The aortic valve is tricuspid. Venous: The inferior vena cava is dilated in size with less than 50% respiratory variability, suggesting right atrial pressure of 15 mmHg. IAS/Shunts: No atrial level shunt detected by color flow Doppler.  LV Volumes (MOD) LV vol d, MOD A2C: 112.0 ml LV vol d, MOD A4C: 114.0 ml LV vol s, MOD A2C: 69.9 ml LV vol s, MOD A4C: 54.9 ml LV SV MOD A2C:     42.1 ml LV SV MOD A4C:      114.0 ml LV SV MOD BP:      50.1 ml IVC IVC diam: 2.80 cm TRICUSPID VALVE TR Peak grad:   48.4 mmHg TR Vmax:        348.00 cm/s Lyman Bishop MD Electronically signed by Lyman Bishop MD Signature Date/Time: 06/03/2021/2:32:48 PM    Final    ECHOCARDIOGRAM LIMITED  Result Date: 06/02/2021    ECHOCARDIOGRAM LIMITED REPORT   Patient Name:   Jordan [name] Date of Exam: 06/02/2021 Medical Rec #:  [id number]        Height:       64.0 in Accession #:    [id number]       Weight:       164.0 lb Date of Birth:  04/28/1966        BSA:          1.798 m Patient Age:    89 years         BP:           144/103 mmHg Patient Gender: M                HR:           103 bpm. Exam Location:  Inpatient Procedure: Limited Echo, Limited Color Doppler and Cardiac Doppler STAT ECHO Indications:    pericardial effusion  History:        Patient has prior history of Echocardiogram examinations, most                 recent 03/02/2021. CHF, COPD and end stage renal disease.; Risk                 Factors:Hypertension.  Sonographer:    Johny Chess RDCS Referring Phys: [place]  1. Left ventricular ejection fraction, by estimation, is 40 to 45%. The left ventricle has mildly decreased function. The left ventricle demonstrates global hypokinesis. There is mild concentric left ventricular hypertrophy. Left ventricular diastolic parameters are consistent with Grade II diastolic dysfunction (pseudonormalization).  2. Right ventricular systolic function is normal. The right ventricular size is normal. There is severely elevated pulmonary artery systolic pressure.  3. Left atrial size was mildly dilated.  4. Right atrial size was mildly dilated.  5. Compared with the echo 02/2021, there is now a small pericardial effusion. The IVC is dilated and there is absent collapse of the IVC, suggesting RA pressure is at least 15 mmHg. However, there are no other signs of tamponade. Tamponade physiology is unlikely. a small pericardial  effusion is present. The pericardial effusion is circumferential. There is no evidence of cardiac tamponade. Large pleural effusion in the left lateral region.  6. The mitral valve is degenerative. Mild to moderate mitral valve regurgitation. No evidence of mitral stenosis.  7. Tricuspid valve regurgitation is moderate.  8. The aortic valve is tricuspid. There is mild calcification of the aortic valve. There is mild thickening of the aortic valve. Aortic valve regurgitation is not visualized. No aortic stenosis is present.  9. The inferior vena cava is dilated in size with <50% respiratory variability, suggesting right atrial pressure of 15 mmHg. FINDINGS  Left Ventricle: Left ventricular ejection fraction, by estimation, is 40 to 45%. The left ventricle has mildly decreased function. The left ventricle demonstrates global hypokinesis. The left ventricular internal cavity size was normal in size. There is  mild concentric left ventricular hypertrophy. Left ventricular diastolic parameters are consistent with Grade II diastolic dysfunction (pseudonormalization). Right Ventricle: The right ventricular size is normal. No increase in right ventricular wall thickness. Right ventricular systolic function is normal. There is severely elevated pulmonary artery systolic pressure. The tricuspid regurgitant velocity is 4.21 m/s, and with an assumed right atrial pressure of 15 mmHg, the estimated right ventricular systolic pressure is 0000000 mmHg. Left Atrium: Left atrial size was mildly dilated. Right Atrium: Right atrial size was mildly dilated. Pericardium: Compared with the echo 02/2021, there is now a small pericardial effusion. The IVC is dilated and there is absent  collapse of the IVC, suggesting RA pressure is at least 15 mmHg. However, there are no other signs of tamponade. Tamponade physiology is unlikely. A small pericardial effusion is present. The pericardial effusion is circumferential. There is no evidence of  cardiac tamponade. Mitral Valve: The mitral valve is degenerative in appearance. Mild to moderate mitral valve regurgitation, with posteriorly-directed jet. No evidence of mitral valve stenosis. Tricuspid Valve: The tricuspid valve is normal in structure. Tricuspid valve regurgitation is moderate . No evidence of tricuspid stenosis. Aortic Valve: The aortic valve is tricuspid. There is mild calcification of the aortic valve. There is mild thickening of the aortic valve. Aortic valve regurgitation is not visualized. No aortic stenosis is present. Pulmonic Valve: The pulmonic valve was normal in structure. Pulmonic valve regurgitation is not visualized. No evidence of pulmonic stenosis. Aorta: The aortic root is normal in size and structure. Venous: The inferior vena cava is dilated in size with less than 50% respiratory variability, suggesting right atrial pressure of 15 mmHg. IAS/Shunts: No atrial level shunt detected by color flow Doppler. Additional Comments: There is a large pleural effusion in the left lateral region. Mild ascites is present. LEFT VENTRICLE PLAX 2D LVIDd:         4.80 cm      Diastology LVIDs:         3.90 cm      LV e' medial:    5.98 cm/s LV PW:         1.30 cm      LV E/e' medial:  24.7 LV IVS:        1.30 cm      LV e' lateral:   8.16 cm/s LVOT diam:     1.90 cm      LV E/e' lateral: 18.1 LV SV:         55 LV SV Index:   30 LVOT Area:     2.84 cm  LV Volumes (MOD) LV vol d, MOD A4C: 129.0 ml LV vol s, MOD A4C: 74.7 ml LV SV MOD A4C:     129.0 ml IVC IVC diam: 2.50 cm LEFT ATRIUM         Index LA diam:    4.70 cm 2.61 cm/m  AORTIC VALVE LVOT Vmax:   123.00 cm/s LVOT Vmean:  85.200 cm/s LVOT VTI:    0.193 m  AORTA Ao Asc diam: 3.10 cm MITRAL VALVE                TRICUSPID VALVE MV Area (PHT): 4.96 cm     TR Peak grad:   70.9 mmHg MV Decel Time: 153 msec     TR Vmax:        421.00 cm/s MV E velocity: 148.00 cm/s MV A velocity: 79.80 cm/s   SHUNTS MV E/A ratio:  1.85         Systemic VTI:   0.19 m                             Systemic Diam: 1.90 cm Skeet Latch MD Electronically signed by Skeet Latch MD Signature Date/Time: 06/02/2021/2:36:10 PM    Final       Subjective: Feeling tired, mild nausea, mild itching, no confusion, fever, ough, dyspnea.  Discharge Exam: Vitals:   06/05/21 1121 06/05/21 1126  BP: 118/89 (!) 143/81  Pulse: 94 91  Resp: (!) 32 17  Temp:    SpO2: 96% 98%  Vitals:   06/05/21 1057 06/05/21 1104 06/05/21 1121 06/05/21 1126  BP: (!) 126/91 112/66 118/89 (!) 143/81  Pulse: 88 87 94 91  Resp: 20 17 (!) 32 17  Temp:      TempSrc:      SpO2: 100% 97% 96% 98%  Weight:      Height:        General: Pt is alert, awake, not in acute distress, lying in bed, appears chronically ill Cardiovascular: RRR, nl S1-S2, no murmurs appreciated.   No LE edema.   Respiratory: Normal respiratory rate and rhythm.  CTAB without rales or wheezes. Abdominal: Abdomen soft and non-tender.  No distension or HSM.   Neuro/Psych: Strength symmetric in upper and lower extremities.  Judgment and insight appear normal.   The results of significant diagnostics from this hospitalization (including imaging, microbiology, ancillary and laboratory) are listed below for reference.     Microbiology: Recent Results (from the past 240 hour(s))  Resp Panel by RT-PCR (Flu A&B, Covid) Nasopharyngeal Swab     Status: None   Collection Time: 05/28/21  7:41 PM   Specimen: Nasopharyngeal Swab; Nasopharyngeal(NP) swabs in vial transport medium  Result Value Ref Range Status   SARS Coronavirus 2 by RT PCR NEGATIVE NEGATIVE Final    Comment: (NOTE) SARS-CoV-2 target nucleic acids are NOT DETECTED.  The SARS-CoV-2 RNA is generally detectable in upper respiratory specimens during the acute phase of infection. The lowest concentration of SARS-CoV-2 viral copies this assay can detect is 138 copies/mL. A negative result does not preclude SARS-Cov-2 infection and should not be used  as the sole basis for treatment or other patient management decisions. A negative result may occur with  improper specimen collection/handling, submission of specimen other than nasopharyngeal swab, presence of viral mutation(s) within the areas targeted by this assay, and inadequate number of viral copies(<138 copies/mL). A negative result must be combined with clinical observations, patient history, and epidemiological information. The expected result is Negative.  Fact Sheet for Patients:  EntrepreneurPulse.com.au  Fact Sheet for Healthcare Providers:  IncredibleEmployment.be  This test is no t yet approved or cleared by the [place] FDA and  has been authorized for detection and/or diagnosis of SARS-CoV-2 by FDA under an Emergency Use Authorization (EUA). This EUA will remain  in effect (meaning this test can be used) for the duration of the COVID-19 declaration under Section 564(b)(1) of the Act, 21 U.S.C.section 360bbb-3(b)(1), unless the authorization is terminated  or revoked sooner.       Influenza A by PCR NEGATIVE NEGATIVE Final   Influenza B by PCR NEGATIVE NEGATIVE Final    Comment: (NOTE) The Xpert Xpress SARS-CoV-2/FLU/RSV plus assay is intended as an aid in the diagnosis of influenza from Nasopharyngeal swab specimens and should not be used as a sole basis for treatment. Nasal washings and aspirates are unacceptable for Xpert Xpress SARS-CoV-2/FLU/RSV testing.  Fact Sheet for Patients: EntrepreneurPulse.com.au  Fact Sheet for Healthcare Providers: IncredibleEmployment.be  This test is not yet approved or cleared by the [place] FDA and has been authorized for detection and/or diagnosis of SARS-CoV-2 by FDA under an Emergency Use Authorization (EUA). This EUA will remain in effect (meaning this test can be used) for the duration of the COVID-19 declaration under Section 564(b)(1)  of the Act, 21 U.S.C. section 360bbb-3(b)(1), unless the authorization is terminated or revoked.  Performed at [facility], [address] [place], [place] [address]   SARS CORONAVIRUS 2 (TAT 6-24 HRS) Nasopharyngeal Nasopharyngeal  Swab     Status: None   Collection Time: 06/03/21  1:28 PM   Specimen: Nasopharyngeal Swab  Result Value Ref Range Status   SARS Coronavirus 2 NEGATIVE NEGATIVE Final    Comment: (NOTE) SARS-CoV-2 target nucleic acids are NOT DETECTED.  The SARS-CoV-2 RNA is generally detectable in upper and lower respiratory specimens during the acute phase of infection. Negative results do not preclude SARS-CoV-2 infection, do not rule out co-infections with other pathogens, and should not be used as the sole basis for treatment or other patient management decisions. Negative results must be combined with clinical observations, patient history, and epidemiological information. The expected result is Negative.  Fact Sheet for Patients: SugarRoll.be  Fact Sheet for Healthcare Providers: https://www.woods-mathews.com/  This test is not yet approved or cleared by the [place] FDA and  has been authorized for detection and/or diagnosis of SARS-CoV-2 by FDA under an Emergency Use Authorization (EUA). This EUA will remain  in effect (meaning this test can be used) for the duration of the COVID-19 declaration under Se ction 564(b)(1) of the Act, 21 U.S.C. section 360bbb-3(b)(1), unless the authorization is terminated or revoked sooner.  Performed at [facility], [place] [address] [place], [place] [address]      Labs: BNP (last 3 results) Recent Labs    05/22/21 2308 05/28/21 1747 06/02/21 1125  BNP >4,500.0* >4,500.0* [id number]*   Basic Metabolic Panel: Recent Labs  Lab 06/02/21 1125 06/02/21 1152 06/03/21 0624 06/04/21 0338  NA 138 138 142 138  K 5.1 4.9 3.8 4.6  CL 99 103 102 98  CO2 18*   --  25 21*  GLUCOSE 72 70 99 77  BUN 121* >130* 86* 91*  CREATININE 16.93* 17.20* 12.82* 13.06*  CALCIUM 9.6  --  9.0 8.9   Liver Function Tests: Recent Labs  Lab 06/02/21 1125  AST 17  ALT 13  ALKPHOS 87  BILITOT 1.5*  PROT 7.6  ALBUMIN 3.4*   No results for input(s): LIPASE, AMYLASE in the last 168 hours. No results for input(s): AMMONIA in the last 168 hours. CBC: Recent Labs  Lab 06/02/21 1125 06/02/21 1152 06/03/21 0624 06/04/21 0338 06/05/21 0403  WBC 9.2  --  8.4 8.8 9.5  NEUTROABS 7.9*  --   --  7.7  --   HGB 7.7* 8.5* 6.7* 8.3* 7.6*  HCT 24.9* 25.0* 21.5* 25.9* 23.9*  MCV 101.6*  --  99.5 97.4 98.0  PLT 106*  --  96* 92* 73*   Cardiac Enzymes: No results for input(s): CKTOTAL, CKMB, CKMBINDEX, TROPONINI in the last 168 hours. BNP: Invalid input(s): POCBNP CBG: No results for input(s): GLUCAP in the last 168 hours. D-Dimer No results for input(s): DDIMER in the last 72 hours. Hgb A1c No results for input(s): HGBA1C in the last 72 hours. Lipid Profile No results for input(s): CHOL, HDL, LDLCALC, TRIG, CHOLHDL, LDLDIRECT in the last 72 hours. Thyroid function studies No results for input(s): TSH, T4TOTAL, T3FREE, THYROIDAB in the last 72 hours.  Invalid input(s): FREET3 Anemia work up Recent Labs    06/03/21 0624  FERRITIN 688*  TIBC 298  IRON 63   Urinalysis    Component Value Date/Time   COLORURINE YELLOW (A) 01/13/2021 0153   APPEARANCEUR CLEAR (A) 01/13/2021 0153   LABSPEC 1.012 01/13/2021 0153   PHURINE 9.0 (H) 01/13/2021 0153   GLUCOSEU 150 (A) 01/13/2021 0153   HGBUR NEGATIVE 01/13/2021 0153   BILIRUBINUR NEGATIVE 01/13/2021 0153   KETONESUR NEGATIVE  01/13/2021 0153   PROTEINUR >=300 (A) 01/13/2021 0153   NITRITE NEGATIVE 01/13/2021 0153   LEUKOCYTESUR NEGATIVE 01/13/2021 0153   Sepsis Labs Invalid input(s): PROCALCITONIN,  WBC,  LACTICIDVEN Microbiology Recent Results (from the past 240 hour(s))  Resp Panel by RT-PCR (Flu A&B,  Covid) Nasopharyngeal Swab     Status: None   Collection Time: 05/28/21  7:41 PM   Specimen: Nasopharyngeal Swab; Nasopharyngeal(NP) swabs in vial transport medium  Result Value Ref Range Status   SARS Coronavirus 2 by RT PCR NEGATIVE NEGATIVE Final    Comment: (NOTE) SARS-CoV-2 target nucleic acids are NOT DETECTED.  The SARS-CoV-2 RNA is generally detectable in upper respiratory specimens during the acute phase of infection. The lowest concentration of SARS-CoV-2 viral copies this assay can detect is 138 copies/mL. A negative result does not preclude SARS-Cov-2 infection and should not be used as the sole basis for treatment or other patient management decisions. A negative result may occur with  improper specimen collection/handling, submission of specimen other than nasopharyngeal swab, presence of viral mutation(s) within the areas targeted by this assay, and inadequate number of viral copies(<138 copies/mL). A negative result must be combined with clinical observations, patient history, and epidemiological information. The expected result is Negative.  Fact Sheet for Patients:  EntrepreneurPulse.com.au  Fact Sheet for Healthcare Providers:  IncredibleEmployment.be  This test is no t yet approved or cleared by the [place] FDA and  has been authorized for detection and/or diagnosis of SARS-CoV-2 by FDA under an Emergency Use Authorization (EUA). This EUA will remain  in effect (meaning this test can be used) for the duration of the COVID-19 declaration under Section 564(b)(1) of the Act, 21 U.S.C.section 360bbb-3(b)(1), unless the authorization is terminated  or revoked sooner.       Influenza A by PCR NEGATIVE NEGATIVE Final   Influenza B by PCR NEGATIVE NEGATIVE Final    Comment: (NOTE) The Xpert Xpress SARS-CoV-2/FLU/RSV plus assay is intended as an aid in the diagnosis of influenza from Nasopharyngeal swab specimens and should  not be used as a sole basis for treatment. Nasal washings and aspirates are unacceptable for Xpert Xpress SARS-CoV-2/FLU/RSV testing.  Fact Sheet for Patients: EntrepreneurPulse.com.au  Fact Sheet for Healthcare Providers: IncredibleEmployment.be  This test is not yet approved or cleared by the [place] FDA and has been authorized for detection and/or diagnosis of SARS-CoV-2 by FDA under an Emergency Use Authorization (EUA). This EUA will remain in effect (meaning this test can be used) for the duration of the COVID-19 declaration under Section 564(b)(1) of the Act, 21 U.S.C. section 360bbb-3(b)(1), unless the authorization is terminated or revoked.  Performed at [facility], [place], [place] [address]   SARS CORONAVIRUS 2 (TAT 6-24 HRS) Nasopharyngeal Nasopharyngeal Swab     Status: None   Collection Time: 06/03/21  1:28 PM   Specimen: Nasopharyngeal Swab  Result Value Ref Range Status   SARS Coronavirus 2 NEGATIVE NEGATIVE Final    Comment: (NOTE) SARS-CoV-2 target nucleic acids are NOT DETECTED.  The SARS-CoV-2 RNA is generally detectable in upper and lower respiratory specimens during the acute phase of infection. Negative results do not preclude SARS-CoV-2 infection, do not rule out co-infections with other pathogens, and should not be used as the sole basis for treatment or other patient management decisions. Negative results must be combined with clinical observations, patient history, and epidemiological information. The expected result is Negative.  Fact Sheet for Patients: SugarRoll.be  Fact Sheet for Healthcare  Providers: https://www.woods-mathews.com/  This test is not yet approved or cleared by the [place] and  has been authorized for detection and/or diagnosis of SARS-CoV-2 by FDA under an Emergency Use Authorization (EUA). This EUA will  remain  in effect (meaning this test can be used) for the duration of the COVID-19 declaration under Se ction 564(b)(1) of the Act, 21 U.S.C. section 360bbb-3(b)(1), unless the authorization is terminated or revoked sooner.  Performed at [facility], [place] [address], [place], [place] [address]      Time coordinating discharge: 25 minutes The  controlled substances registry was reviewed for this patient      30 Day Unplanned Readmission Risk Score    Flowsheet Row ED to Hosp-Admission (Current) from 06/02/2021 in [facility]  30 Day Unplanned Readmission Risk Score (%) 50.35 Filed at 06/05/2021 1600       This score is the patient's risk of an unplanned readmission within 30 days of being discharged (0 -100%). The score is based on dignosis, age, lab data, medications, orders, and past utilization.   Low:  0-14.9   Medium: 15-21.9   High: 22-29.9   Extreme: 30 and above            SIGNED:   Edwin Dada, MD  [facility] 06/05/2021, 4:21 PM

## 2021-06-05 NOTE — Op Note (Signed)
[facility] Patient Name: Jordan [name] Procedure Date : 06/05/2021 MRN: [id number] Attending MD: Georgian Co ,  Date of Birth: 1966-04-25 CSN: [id number] Age: 55 Admit Type: Inpatient Procedure:                Upper GI endoscopy Indications:              Melena, Anemia Providers:                Adline Mango" Michaelle Birks, RN, Janie Billups,                            Arbutus Leas CRNA Referring MD:             Hospitalist Group Medicines:                Monitored Anesthesia Care Complications:            No immediate complications. Estimated Blood Loss:     Estimated blood loss was minimal. Procedure:                Pre-Anesthesia Assessment:                           - Prior to the procedure, a History and Physical                            was performed, and patient medications and                            allergies were reviewed. The patient is competent.                            The risks and benefits of the procedure and the                            sedation options and risks were discussed with the                            patient. All questions were answered and informed                            consent was obtained. Patient identification and                            proposed procedure were verified by the physician                            in the pre-procedure area. Mental Status                            Examination: normal. Prophylactic Antibiotics: The                            patient does not require prophylactic antibiotics.  Prior Anticoagulants: The patient has taken no                            previous anticoagulant or antiplatelet agents. ASA                            Grade Assessment: IV - A patient with severe                            systemic disease that is a constant threat to life.                            After reviewing the risks and benefits, the patient                             was deemed in satisfactory condition to undergo the                            procedure. The anesthesia plan was to use monitored                            anesthesia care (MAC). Immediately prior to                            administration of medications, the patient was                            re-assessed for adequacy to receive sedatives. The                            heart rate, respiratory rate, oxygen saturations,                            blood pressure, adequacy of pulmonary ventilation,                            and response to care were monitored throughout the                            procedure. The physical status of the patient was                            re-assessed after the procedure.                           After obtaining informed consent, the endoscope was                            passed under direct vision. Throughout the                            procedure, the patient's blood pressure, pulse, and  oxygen saturations were monitored continuously. The                            [id number]-[id number] ([id number]) Olympus endoscope was introduced                            through the mouth, and advanced to the second part                            of duodenum. Scope In: Scope Out: Findings:      LA Grade C (one or more mucosal breaks continuous between tops of 2 or       more mucosal folds, less than 75% circumference) esophagitis with no       bleeding was found in the distal esophagus.      One cratered esophageal ulcer was found at the gastroesophageal       junction. The lesion was 3 mm in largest dimension.      A medium-sized hiatal hernia was present.      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      Inflammation characterized by congestion (edema) was found in the       duodenal bulb. Impression:               - LA Grade C esophagitis with no  bleeding.                           - Esophageal ulcer.                           - Medium-sized hiatal hernia.                           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                           - Duodenitis.                           - No specimens collected. Recommendation:           - It is suspected that the patient's black stools                            and anemia may be due to the GE junction ulcer                            versus esophagitis that was seen on today's exam.                           - PPI BID for 8 weeks, then decrease to QD                           - Check serum H pylori antibody. Treat if positive.                           -  Repeat EGD in 2-3 months to assess for healing of                            esophagitis and GE junction ulcer, can follow up                            with Dr. Bonna Gains in [facility] for follow up EGD                            at that time                           - Perform a colonoscopy today. Procedure Code(s):        --- Professional ---                           [phone], Esophagogastroduodenoscopy, flexible,                            transoral; diagnostic, including collection of                            specimen(s) by brushing or washing, when performed                            (separate procedure) Diagnosis Code(s):        --- Professional ---                           K20.90, Esophagitis, unspecified without bleeding                           K22.10, Ulcer of esophagus without bleeding                           K44.9, Diaphragmatic hernia without obstruction or                            gangrene                           K76.6, Portal hypertension                           K31.89, Other diseases of stomach and duodenum                           K29.80, Duodenitis without bleeding                           K92.1, Melena (includes Hematochezia)                           D64.9, Anemia, unspecified CPT  copyright 2019 American Medical Association. All rights reserved. The codes documented in this report are preliminary and upon coder review may  be revised to meet current compliance requirements. [name] "[name]" [place],  06/05/2021  11:03:59 AM Number of Addenda: 0

## 2021-06-05 NOTE — Op Note (Signed)
[facility] Patient Name: Jordan [name] Procedure Date : 06/05/2021 MRN: [id number] Attending MD: Georgian Co ,  Date of Birth: 1966/08/17 CSN: [id number] Age: 55 Admit Type: Inpatient Procedure:                Colonoscopy Indications:              Anemia Providers:                Adline Mango" Michaelle Birks, RN, Cherylynn Ridges,                            Technician Referring MD:             Hospitalist group Medicines:                Monitored Anesthesia Care Complications:            No immediate complications. Estimated Blood Loss:     Estimated blood loss was minimal. Procedure:                Pre-Anesthesia Assessment:                           - Prior to the procedure, a History and Physical                            was performed, and patient medications and                            allergies were reviewed. The patient is competent.                            The risks and benefits of the procedure and the                            sedation options and risks were discussed with the                            patient. All questions were answered and informed                            consent was obtained. Patient identification and                            proposed procedure were verified by the physician                            in the pre-procedure area. Mental Status                            Examination: normal. Prophylactic Antibiotics: The                            patient does not require prophylactic antibiotics.                            Prior Anticoagulants: The patient has  taken no                            previous anticoagulant or antiplatelet agents. ASA                            Grade Assessment: IV - A patient with severe                            systemic disease that is a constant threat to life.                            After reviewing the risks and benefits, the patient                            was deemed in  satisfactory condition to undergo the                            procedure. The anesthesia plan was to use monitored                            anesthesia care (MAC). Immediately prior to                            administration of medications, the patient was                            re-assessed for adequacy to receive sedatives. The                            heart rate, respiratory rate, oxygen saturations,                            blood pressure, adequacy of pulmonary ventilation,                            and response to care were monitored throughout the                            procedure. The physical status of the patient was                            re-assessed after the procedure.                           After obtaining informed consent, the colonoscope                            was passed under direct vision. Throughout the                            procedure, the patient's blood pressure, pulse, and  oxygen saturations were monitored continuously. The                            [id number] ([id number] Olympus coloscope was                            introduced through the anus and advanced to the the                            cecum, identified by appendiceal orifice and                            ileocecal valve. The colonoscopy was performed                            without difficulty. The patient tolerated the                            procedure well. The quality of the bowel                            preparation was fair. Scope In: 9:56:07 AM Scope Out: 10:19:30 AM Scope Withdrawal Time: 0 hours 10 minutes 53 seconds  Total Procedure Duration: 0 hours 23 minutes 23 seconds  Findings:      Two sessile, non-bleeding polyps were found in the transverse colon. The       polyps were 3 to 6 mm in size. These polyps were removed with a cold       snare. Resection and retrieval were complete.      A 6 mm polyp was found in the sigmoid  colon. The polyp was pedunculated.       The polyp was removed with a hot snare. Resection and retrieval were       complete.      A few small-mouthed diverticula were found in the sigmoid colon.      Non-bleeding internal hemorrhoids were found during retroflexion. Impression:               - Preparation of the colon was fair.                           - Two 3 to 6 mm, non-bleeding polyps in the                            transverse colon, removed with a cold snare.                            Resected and retrieved.                           - One 6 mm polyp in the sigmoid colon, removed with                            a hot snare. Resected and retrieved.                           -  Diverticulosis in the sigmoid colon.                           - Non-bleeding internal hemorrhoids. Recommendation:           - Return patient to hospital ward for ongoing care.                           - Await pathology results.                           - Recommend repeat colonoscopy in 1 year for                            suboptimal prep. Would do a 2 day Golytely prep at                            that time.                           - The findings and recommendations were discussed                            with the patient and/or primary team. Procedure Code(s):        --- Professional ---                           [phone], Colonoscopy, flexible; with removal of                            tumor(s), polyp(s), or other lesion(s) by snare                            technique Diagnosis Code(s):        --- Professional ---                           K64.8, Other hemorrhoids                           K63.5, Polyp of colon                           D64.9, Anemia, unspecified                           K57.30, Diverticulosis of large intestine without                            perforation or abscess without bleeding CPT copyright 2019 American Medical Association. All rights reserved. The codes documented in  this report are preliminary and upon coder review may  be revised to meet current compliance requirements. [name] "[name],  06/05/2021 11:04:33 AM Number of Addenda: 0

## 2021-06-05 NOTE — Progress Notes (Addendum)
[facility] Progress Note   Subjective:    Patient seen and examined at bedside. S/P EGD/Colonoscopy by Dr. Lorenso Courier. Tolerated yesterday's HD with net UF 3.5L. Denies SOB and CP. Spoke to Hospitalist Dr. Loleta Books. Plan for possible discharge today.  Objective Vitals:   06/05/21 1057 06/05/21 1104 06/05/21 1121 06/05/21 1126  BP: (!) 126/91 112/66 118/89 (!) 143/81  Pulse: 88 87 94 91  Resp: 20 17 (!) 32 17  Temp:      TempSrc:      SpO2: 100% 97% 96% 98%  Weight:      Height:       Physical Exam General: Ill-appearing; NAD Heart: S1 and S2; No murmurs, gallops, or rubs Lungs: Clear throughout; No wheezing, rales, or rhonchi Abdomen: Large, distended, non-tender, active bowel sounds Extremities: No edema BLLE Dialysis Access: L AVF (+) Bruit/Thrill   Filed Weights   06/04/21 1505 06/04/21 1847 06/05/21 0854  Weight: 76.4 kg 72.6 kg 72.6 kg    Intake/Output Summary (Last 24 hours) at 06/05/2021 1357 Last data filed at 06/05/2021 1238 Gross per 24 hour  Intake 370 ml  Output 3500 ml  Net -3130 ml    Additional Objective Labs: Basic Metabolic Panel: Recent Labs  Lab 06/02/21 1125 06/02/21 1152 06/03/21 0624 06/04/21 0338  NA 138 138 142 138  K 5.1 4.9 3.8 4.6  CL 99 103 102 98  CO2 18*  --  25 21*  GLUCOSE 72 70 99 77  BUN 121* >130* 86* 91*  CREATININE 16.93* 17.20* 12.82* 13.06*  CALCIUM 9.6  --  9.0 8.9   Liver Function Tests: Recent Labs  Lab 06/02/21 1125  AST 17  ALT 13  ALKPHOS 87  BILITOT 1.5*  PROT 7.6  ALBUMIN 3.4*   No results for input(s): LIPASE, AMYLASE in the last 168 hours. CBC: Recent Labs  Lab 06/02/21 1125 06/02/21 1152 06/03/21 0624 06/04/21 0338 06/05/21 0403  WBC 9.2  --  8.4 8.8 9.5  NEUTROABS 7.9*  --   --  7.7  --   HGB 7.7*   < > 6.7* 8.3* 7.6*  HCT 24.9*   < > 21.5* 25.9* 23.9*  MCV 101.6*  --  99.5 97.4 98.0  PLT 106*  --  96* 92* 73*   < > = values in this interval not displayed.   Blood  Culture    Component Value Date/Time   SDES BLOOD RIGHT HAND 04/29/2021 2248   SPECREQUEST  04/29/2021 2248    BOTTLES DRAWN AEROBIC AND ANAEROBIC Blood Culture results may not be optimal due to an inadequate volume of blood received in culture bottles   CULT  04/29/2021 2248    NO GROWTH 5 DAYS Performed at [facility], [place] [address] [place], [place] [address]    REPTSTATUS 05/04/2021 FINAL 04/29/2021 2248    Cardiac Enzymes: No results for input(s): CKTOTAL, CKMB, CKMBINDEX, TROPONINI in the last 168 hours. CBG: No results for input(s): GLUCAP in the last 168 hours. Iron Studies:  Recent Labs    06/03/21 0624  IRON 63  TIBC 298  FERRITIN 688*   Lab Results  Component Value Date   INR 1.4 (H) 03/21/2020   INR 1.4 (H) 10/05/2019   INR 1.0 07/22/2019   Studies/Results: ECHOCARDIOGRAM LIMITED  Result Date: 06/03/2021    ECHOCARDIOGRAM LIMITED REPORT   Patient Name:   Jordan [name] Date of Exam: 06/03/2021 Medical Rec #:  [id number]  Height:       64.0 in Accession #:    [id number]       Weight:       171.3 lb Date of Birth:  10-10-1965        BSA:          1.832 m Patient Age:    55 years         BP:           145/91 mmHg Patient Gender: M                HR:           99 bpm. Exam Location:  Inpatient Procedure: Limited Echo, Cardiac Doppler and Color Doppler STAT ECHO Indications:    I31.3 Pericardial effusion (noninflammatory)  History:        Patient has prior history of Echocardiogram examinations, most                 recent 06/02/2021. Signs/Symptoms:Chest Pain and Dyspnea; Risk                 Factors:Current Smoker. ESRD. Recent covid infection.                 Pericardial effusion. ETOH.  Sonographer:    [place] Referring Phys: [id number] AMRIT ADHIKARI IMPRESSIONS  1. Limited echo for pericardial effusion  2. Left ventricular ejection fraction, by estimation, is 45 to 50%. The left ventricle has mildly decreased function. The left ventricle demonstrates  global hypokinesis. There is the interventricular septum is flattened in systole and diastole, consistent with right ventricular pressure and volume overload.  3. Pericardial effusion measures up to 1.5 cm. Moderate pericardial effusion. The pericardial effusion is circumferential. There is no evidence of cardiac tamponade.  4. The mitral valve is degenerative. Moderate to severe mitral valve regurgitation.  5. The tricuspid valve is degenerative. Tricuspid valve regurgitation is moderate.  6. The aortic valve is tricuspid.  7. Right ventricular systolic function is normal. The right ventricular size is normal. There is severely elevated pulmonary artery systolic pressure. The estimated right ventricular systolic pressure is 55.7 mmHg.  8. The inferior vena cava is dilated in size with <50% respiratory variability, suggesting right atrial pressure of 15 mmHg.  9. Left atrial size was moderately dilated. 10. Right atrial size was mildly dilated. Comparison(s): Changes from prior study are noted. 06/03/2021: LVEF 40-45%, small pericardial effusion, no tamponade. The pericardial effusion appears larger today then by echo yesterday. Cannot exclude clinical tamponade given degree of pulmonary hypertension. Clinical correlation is advised. Conclusion(s)/Recommendation(s): Due to the above findings, cardiology consult is recommended. Critical findings reported to Dr. Veverly Fells and acknowledged at 2:23 pm. FINDINGS  Left Ventricle: Left ventricular ejection fraction, by estimation, is 45 to 50%. The left ventricle has mildly decreased function. The left ventricle demonstrates global hypokinesis. The interventricular septum is flattened in systole and diastole, consistent with right ventricular pressure and volume overload. Right Ventricle: The right ventricular size is normal. No increase in right ventricular wall thickness. Right ventricular systolic function is normal. There is severely elevated pulmonary artery systolic  pressure. The tricuspid regurgitant velocity is 3.48 m/s, and with an assumed right atrial pressure of 15 mmHg, the estimated right ventricular systolic pressure is 32.2 mmHg. Left Atrium: Left atrial size was moderately dilated. Right Atrium: Right atrial size was mildly dilated. Pericardium: Pericardial effusion measures up to 1.5 cm. A moderately sized pericardial effusion is present. The pericardial effusion is circumferential.  There is no evidence of cardiac tamponade. Mitral Valve: The mitral valve is degenerative in appearance. Moderate to severe mitral valve regurgitation, with wall-impinging jet. Tricuspid Valve: The tricuspid valve is degenerative in appearance. Tricuspid valve regurgitation is moderate. Aortic Valve: The aortic valve is tricuspid. Venous: The inferior vena cava is dilated in size with less than 50% respiratory variability, suggesting right atrial pressure of 15 mmHg. IAS/Shunts: No atrial level shunt detected by color flow Doppler.  LV Volumes (MOD) LV vol d, MOD A2C: 112.0 ml LV vol d, MOD A4C: 114.0 ml LV vol s, MOD A2C: 69.9 ml LV vol s, MOD A4C: 54.9 ml LV SV MOD A2C:     42.1 ml LV SV MOD A4C:     114.0 ml LV SV MOD BP:      50.1 ml IVC IVC diam: 2.80 cm TRICUSPID VALVE TR Peak grad:   48.4 mmHg TR Vmax:        348.00 cm/s Lyman Bishop MD Electronically signed by Lyman Bishop MD Signature Date/Time: 06/03/2021/2:32:48 PM    Final     Medications:  ferric gluconate (FERRLECIT) IVPB 250 mg (06/05/21 1238)    (feeding supplement) PROSource Plus  30 mL Oral BID BM   bisacodyl  20 mg Oral Once   calcium acetate  1,334 mg Oral TID WC   Chlorhexidine Gluconate Cloth  6 each Topical Q0600   Chlorhexidine Gluconate Cloth  6 each Topical Q0600   cinacalcet  30 mg Oral Q breakfast   docusate sodium  100 mg Oral BID   feeding supplement  1 Container Oral TID BM   ferrous sulfate  325 mg Oral BID WC   guaiFENesin  600 mg Oral BID   hydrALAZINE  50 mg Oral TID   hydrOXYzine  25 mg  Oral TID   multivitamin  1 tablet Oral QHS   nicotine  14 mg Transdermal Daily   pantoprazole (PROTONIX) IV  40 mg Intravenous Q12H   peg 3350 powder  0.5 kit Oral Once   sodium chloride flush  3 mL Intravenous Q12H    Dialysis Orders: MWF 4hrs [facility] [address] 67kg, 2K 2Ca, 400/800 LU AVF No Heparin Venofer 50qwk Mircera 222mg q2wks Last Labs: Hgb 8.3, K 4.6, Ca 8.9, Alb 3.4  Assessment/Plan: ESRD/Volume Overload 2nd Missed HD-on HD MWF. History of non-compliance with HD treatments. Tolerated yesterday's HD with net UF 3.5L. Ongoing discussions on importance on coming to all required treatments and monitoring fluid consumption-both Hospitalist and myself spoke to patient again today. He reports having many things going on with his life-reports trying to do better. Pericardial Effusion-CXR showed pericardial effusion but ECHO showed (+) for small pericardial effusion no evidence of tamponade. Cardiology signed off.  Rash-patient presented with pruritic rash concerning for scabies. Permethrin previously given. Will monitor. Last PO4 level high at 9.4 (05/29/21)-Will check PO4 and manage binders regimen in outpatient.  Hypertension/volume  - Blood pressures controlled; doesn't appear to be in acute volume overload currently.   Anemia of CKD - Hgb up to 8.3. S/p 1 unit PRBC on 9/12. Hgb now 7.6 this AM. Receiving Fe load X 2 doses today. S/p EGD/Colonoscopy 9/14 by Dr. DLorenso Courier Reviewed both reports-EGD: may be d/t GE junction ulcer vs. Esophagitis. Colonoscopy: 3-6 non-bleeding polyps in transverse colon-recommend f/u in 1 year.  Secondary Hyperparathyroidism -  Ca okay, last PO4 high-d/t frequent missed HD. Will resume binders once diet is resumed. Plan to re-check PO4 and manage binder regimen at discharge.  Nutrition -  Advance to renal diet with fluid restriction when clinically stable. Disposition-Plan for possible dc today. Spoke to Dr. Loleta Books and Renal Navigator who reached out  to his OP HD center to schedule extra HD for tomorrow then resume his normal schedule on Friday.   Tobie Poet, NP [facility] 06/05/2021,1:57 PM  LOS: 0 days    Seen and examined independently.  Agree with note and exam as documented above by physician extender and as noted here.  General adult male in bed in no acute distress HEENT normocephalic atraumatic extraocular movements intact sclera anicteric Neck supple trachea midline Lungs clear to auscultation bilaterally normal work of breathing at rest  Heart S1S2 no rub Abdomen soft nontender nondistended Extremities no edema  Psych normal mood and affect Neuro - alert and oriented x 3 provides hx and follows commands Access left AVF b/t  ESRD - For HD tomorrow and then resume outpatient MWF schedule.  Discussed importance of compliance  Pericardial effusion - as above. No evidence of tamponade. Encouraged compliance with dialysis   Anemia CKD  - aranesp 200 mcg today  - not compliance with outpatient tx's and so frequently misses meds 2/2 same - s/p egd and colonoscopy as above  Secondary hyperpara - resume binders when taking PO - encourage compliance with outpt HD and meds  Disposition per primary team - may be today  Claudia Desanctis, MD 06/05/2021  2:27 PM

## 2021-06-05 NOTE — TOC Transition Note (Addendum)
Transition of Care [facility]) - CM/SW Discharge Note   Patient Details  Name: Jordan [name] MRN: [id number] Date of Birth: [birth year]  [facility] CM/SW Contact:  Tom-[name], Renea Ee, RN Phone Number: 06/05/2021, 3:47 PM   Clinical Narrative:    CM spoke with patient about discharge at bedside. States he was ready to go home. Concerned about transportation to and from dialysis and also needs help with food/meals. Patient currently lives in [place] and goes to [facility] for dialysis. Patient has Medicaid and CM called Medicaid office and spoke with Carlandra [phone]) who verifies that patient is active with their transportation services on his dialysis days which are MWF. CM notified Carlandra of MD's request for patient to have an extra dialysis day tomorrow 09/15 and should be picked up at 32 am. Collier Bullock states she has patient on the schedule for tomorrow and will continue on his dialysis days. Patient notified. CM gave patient information about Meals on wheels for him to call or apply. CM notified patient that there is a wait list. CM will give nurse the number to call for lift when patient is ready for discharge. No further TOC needs at this time.  17:24- CM called [employer] lift to transport patient home.    Final next level of care: [place] Barriers to Discharge: No Barriers Identified   Patient Goals and CMS Choice Patient states their goals for this hospitalization and ongoing recovery are:: To go home CMS Medicare.gov Compare Post Acute Care list provided to:: Patient Choice offered to / list presented to : Patient  Discharge Placement                       Discharge Plan and Services   Discharge Planning Services: CM Consult            DME Arranged: Gilford Rile rolling DME Agency: [employer] Date DME Agency Contacted: 06/05/21 Time DME Agency Contacted: [phone] Representative spoke with at DME Agency: Freda Munro HH Arranged: RN,  PT, OT [facility] Agency: [place] Date [place]: 06/05/21 Time [place]: [id number] Representative spoke with at [place]: [place]) Interventions     Readmission Risk Interventions No flowsheet data found.

## 2021-06-06 ENCOUNTER — Encounter: Payer: Self-pay | Admitting: Internal Medicine

## 2021-06-06 LAB — SURGICAL PATHOLOGY

## 2021-06-06 LAB — TYPE AND SCREEN
ABO/RH(D): O NEG
Antibody Screen: POSITIVE
DAT, IgG: POSITIVE
Donor AG Type: NEGATIVE
Donor AG Type: NEGATIVE
Unit division: 0
Unit division: 0

## 2021-06-06 LAB — H. PYLORI ANTIBODY, IGG: H Pylori IgG: 2.47 Index Value — ABNORMAL HIGH (ref 0.00–0.79)

## 2021-06-06 LAB — BPAM RBC
Blood Product Expiration Date: 202209152359
Blood Product Expiration Date: 202209222359
ISSUE DATE / TIME: 202209121515
Unit Type and Rh: 9500
Unit Type and Rh: 9500

## 2021-06-06 NOTE — TOC Transition Note (Signed)
Transition of care contact from inpatient facility  Date of discharge: 06/05/21 Date of contact: 06/06/21 Method: Attempted Spoke to: No Answer  Patient contacted to discuss recent hospitalization but he did not pick up the phone. Unable to leave voicemail because phone ringing ended with busy signal.  Patient will follow up with his outpatient HD unit on: Saturday 06/08/21 at [facility].  Tobie Poet, NP

## 2021-06-07 ENCOUNTER — Telehealth: Payer: Self-pay

## 2021-06-07 NOTE — Telephone Encounter (Signed)
-----   Message from Virgel Manifold, MD sent at 06/07/2021 10:26 AM EDT ----- Please schedule for clinic visit  ----- Message ----- From: Sharyn Creamer, MD Sent: 06/05/2021  11:13 AM EDT To: Vena Rua, PA-C, Virgel Manifold, MD  Hi Dr. Bonna Gains,   We just scoped your patient [name] since he was admitted to [facility] for SOB and volume overload, found to have a drop in his Hb. EGD showed grade C esophagitis and esophageal ulcer that are suspected to be the source of his black stools and could have contributed to his anemia. Recommend PPI BID for at least 8 weeks. Will need follow up EGD in 2-3 months, but since he follows with you, would you be able to schedule a follow up appt or EGD with him? Colonoscopy with not optimal prep but was able to remove 3 polyps and saw diverticulosis. He will need repeat colonoscopy in 1 year due to the inadequate prep. Please feel free to let me know if you have any questions.  Thanks, [name]

## 2021-06-07 NOTE — Telephone Encounter (Signed)
Called pt and error msg stated number was invalid

## 2021-06-07 NOTE — Telephone Encounter (Signed)
Tried to call pt, error msg came on saying the number is invalid

## 2021-06-07 NOTE — Telephone Encounter (Signed)
-----   Message from Virgel Manifold, MD sent at 06/07/2021 10:26 AM EDT ----- Please schedule for clinic visit  ----- Message ----- From: Sharyn Creamer, MD Sent: 06/05/2021  11:13 AM EDT To: Vena Rua, PA-C, Virgel Manifold, MD  Hi Dr. Bonna Gains,   We just scoped your patient Jordan [name] since he was admitted to [facility] for SOB and volume overload, found to have a drop in his Hb. EGD showed grade C esophagitis and esophageal ulcer that are suspected to be the source of his black stools and could have contributed to his anemia. Recommend PPI BID for at least 8 weeks. Will need follow up EGD in 2-3 months, but since he follows with you, would you be able to schedule a follow up appt or EGD with him? Colonoscopy with not optimal prep but was able to remove 3 polyps and saw diverticulosis. He will need repeat colonoscopy in 1 year due to the inadequate prep. Please feel free to let me know if you have any questions.  Thanks, [name]

## 2021-06-10 NOTE — Telephone Encounter (Signed)
Tried to call pt, error msg came on saying the number is invalid  Unable to contact letter mailed requesting pt to call the office to schedule an appointment

## 2021-06-14 ENCOUNTER — Other Ambulatory Visit: Payer: Self-pay

## 2021-06-14 ENCOUNTER — Emergency Department: Payer: Medicare Other

## 2021-06-14 ENCOUNTER — Emergency Department
Admission: EM | Admit: 2021-06-14 | Discharge: 2021-06-14 | Disposition: A | Discharge status: Home / Self Care | Payer: Medicare Other | Attending: Emergency Medicine | Admitting: Emergency Medicine

## 2021-06-14 DIAGNOSIS — N186 End stage renal disease: Secondary | ICD-10-CM | POA: Insufficient documentation

## 2021-06-14 DIAGNOSIS — Z8616 Personal history of COVID-19: Secondary | ICD-10-CM | POA: Diagnosis not present

## 2021-06-14 DIAGNOSIS — Z20822 Contact with and (suspected) exposure to covid-19: Secondary | ICD-10-CM | POA: Insufficient documentation

## 2021-06-14 DIAGNOSIS — Z79899 Other long term (current) drug therapy: Secondary | ICD-10-CM | POA: Insufficient documentation

## 2021-06-14 DIAGNOSIS — I5041 Acute combined systolic (congestive) and diastolic (congestive) heart failure: Secondary | ICD-10-CM | POA: Insufficient documentation

## 2021-06-14 DIAGNOSIS — J810 Acute pulmonary edema: Secondary | ICD-10-CM | POA: Diagnosis not present

## 2021-06-14 DIAGNOSIS — B86 Scabies: Secondary | ICD-10-CM

## 2021-06-14 DIAGNOSIS — R220 Localized swelling, mass and lump, head: Secondary | ICD-10-CM | POA: Diagnosis not present

## 2021-06-14 DIAGNOSIS — J449 Chronic obstructive pulmonary disease, unspecified: Secondary | ICD-10-CM | POA: Diagnosis not present

## 2021-06-14 DIAGNOSIS — F17210 Nicotine dependence, cigarettes, uncomplicated: Secondary | ICD-10-CM | POA: Insufficient documentation

## 2021-06-14 DIAGNOSIS — I132 Hypertensive heart and chronic kidney disease with heart failure and with stage 5 chronic kidney disease, or end stage renal disease: Secondary | ICD-10-CM | POA: Diagnosis not present

## 2021-06-14 DIAGNOSIS — Z992 Dependence on renal dialysis: Secondary | ICD-10-CM | POA: Insufficient documentation

## 2021-06-14 DIAGNOSIS — R0602 Shortness of breath: Secondary | ICD-10-CM | POA: Diagnosis present

## 2021-06-14 LAB — BASIC METABOLIC PANEL
Anion gap: 21 — ABNORMAL HIGH (ref 5–15)
BUN: 95 mg/dL — ABNORMAL HIGH (ref 6–20)
CO2: 19 mmol/L — ABNORMAL LOW (ref 22–32)
Calcium: 9.1 mg/dL (ref 8.9–10.3)
Chloride: 103 mmol/L (ref 98–111)
Creatinine, Ser: 12.91 mg/dL — ABNORMAL HIGH (ref 0.61–1.24)
GFR, Estimated: 4 mL/min — ABNORMAL LOW (ref >60)
Glucose, Bld: 120 mg/dL — ABNORMAL HIGH (ref 70–99)
Potassium: 4.9 mmol/L (ref 3.5–5.1)
Sodium: 143 mmol/L (ref 135–145)

## 2021-06-14 LAB — HEPATIC FUNCTION PANEL
ALT: 17 U/L (ref 0–44)
AST: 18 U/L (ref 15–41)
Albumin: 3.4 g/dL — ABNORMAL LOW (ref 3.5–5.0)
Alkaline Phosphatase: 93 U/L (ref 38–126)
Bilirubin, Direct: 0.3 mg/dL — ABNORMAL HIGH (ref 0.0–0.2)
Indirect Bilirubin: 1.3 mg/dL — ABNORMAL HIGH (ref 0.3–0.9)
Total Bilirubin: 1.6 mg/dL — ABNORMAL HIGH (ref 0.3–1.2)
Total Protein: 7.3 g/dL (ref 6.5–8.1)

## 2021-06-14 LAB — TROPONIN I (HIGH SENSITIVITY)
Troponin I (High Sensitivity): 136 ng/L (ref <18)
Troponin I (High Sensitivity): 126 ng/L (ref <18)

## 2021-06-14 LAB — CBC
HCT: 24.5 % — ABNORMAL LOW (ref 39.0–52.0)
Hemoglobin: 7.5 g/dL — ABNORMAL LOW (ref 13.0–17.0)
MCH: 31.9 pg (ref 26.0–34.0)
MCHC: 30.6 g/dL (ref 30.0–36.0)
MCV: 104.3 fL — ABNORMAL HIGH (ref 80.0–100.0)
Platelets: 117 10*3/uL — ABNORMAL LOW (ref 150–400)
RBC: 2.35 MIL/uL — ABNORMAL LOW (ref 4.22–5.81)
RDW: 17.8 % — ABNORMAL HIGH (ref 11.5–15.5)
WBC: 7.3 10*3/uL (ref 4.0–10.5)
nRBC: 0.3 % — ABNORMAL HIGH (ref 0.0–0.2)

## 2021-06-14 LAB — RESP PANEL BY RT-PCR (FLU A&B, COVID) ARPGX2
Influenza A by PCR: NEGATIVE
Influenza B by PCR: NEGATIVE
SARS Coronavirus 2 by RT PCR: NEGATIVE

## 2021-06-14 MED ORDER — IPRATROPIUM-ALBUTEROL 0.5-2.5 (3) MG/3ML IN SOLN
3.0000 mL | Freq: Once | RESPIRATORY_TRACT | Status: AC
  Administered 2021-06-14: 3 mL via RESPIRATORY_TRACT
  Filled 2021-06-14: qty 3

## 2021-06-14 MED ORDER — PERMETHRIN 5 % EX CREA: 1.0000 "application " | TOPICAL_CREAM | Freq: Once | CUTANEOUS | 1 refills | Status: AC

## 2021-06-14 MED ORDER — CHLORHEXIDINE GLUCONATE CLOTH 2 % EX PADS
6.0000 | MEDICATED_PAD | Freq: Every day | CUTANEOUS | Status: DC
  Filled 2021-06-14: qty 6

## 2021-06-14 NOTE — ED Notes (Signed)
Attempted IV access in right forearm. Unsuccessful. Pt tolerated well.

## 2021-06-14 NOTE — ED Triage Notes (Signed)
Pt comes into the ED via [facility] from home c/o missing dialysis due to being in jail.  Pt also states that he is leaking clear fluid from his buttock. Pt missed his dialysis on Wednesday and this morning.   96% on 3L 142/80 103 22RR

## 2021-06-14 NOTE — Progress Notes (Signed)
[facility]  ROUNDING NOTE   Subjective:   Jordan [name] is a 55 year old male with a past medical history of alcohol abuse, anemia, via diastolic chronic heart failure, ACS, COPD, hypertension, end-stage renal disease on hemodialysis.  He presents to the emergency room complaining of shortness of breath.    Patient known to our clinic from previous admissions. Currently receives outpatient treatment at [facility], under the care of [facility] physicians.  Patient reports his last dialysis was on Monday.  He missed his treatment on Wednesday because he was in jail for an outstanding warrant. His chest x-ray shows pulmonary edema Patient normally wears oxygen at 2-3 L/min at baseline. Denies any fevers, chills.  He does have a cough.  Denies chest pain.  He has been eating okay and is asking for sandwich this afternoon.   Objective:  Vital signs in last 24 hours:  Temp:  [97.7 F (36.5 C)] 97.7 F (36.5 C) (09/23 1346) Pulse Rate:  [100] 100 (09/23 1346) Resp:  [36] 36 (09/23 1346) BP: (132)/(83) 132/83 (09/23 1346) SpO2:  [100 %] 100 % (09/23 1346) Weight:  [72.6 kg] 72.6 kg (09/23 1347)  Weight change:  Filed Weights   06/14/21 1347  Weight: 72.6 kg    Intake/Output: No intake/output data recorded.   Intake/Output this shift:  No intake/output data recorded.  Physical Exam: General: NAD, laying in bed, restless  Head: Normocephalic, atraumatic. Moist oral mucosal membranes  Eyes: Anicteric  Lungs:  Crackles, wheeze bilaterally, [place] O2  Heart: Tachycardia  Abdomen:  Soft, nontender  Extremities:  2+ peripheral edema.  Neurologic: Alert, moving all four extremities  Skin: No lesions  Access: Lt upper arm AVF    Basic Metabolic Panel: Recent Labs  Lab 06/14/21 1359  NA 143  K 4.9  CL 103  CO2 19*  GLUCOSE 120*  BUN 95*  CREATININE 12.91*  CALCIUM 9.1     Liver Function Tests: Recent Labs  Lab 06/14/21 1359  AST 18  ALT 17  ALKPHOS  93  BILITOT 1.6*  PROT 7.3  ALBUMIN 3.4*    No results for input(s): LIPASE, AMYLASE in the last 168 hours. No results for input(s): AMMONIA in the last 168 hours.  CBC: Recent Labs  Lab 06/14/21 1359  WBC 7.3  HGB 7.5*  HCT 24.5*  MCV 104.3*  PLT 117*     Cardiac Enzymes: No results for input(s): CKTOTAL, CKMB, CKMBINDEX, TROPONINI in the last 168 hours.  BNP: Invalid input(s): POCBNP  CBG: No results for input(s): GLUCAP in the last 168 hours.  Microbiology: Results for orders placed or performed during the hospital encounter of 06/02/21  SARS CORONAVIRUS 2 (TAT 6-24 HRS) Nasopharyngeal Nasopharyngeal Swab     Status: None   Collection Time: 06/03/21  1:28 PM   Specimen: Nasopharyngeal Swab  Result Value Ref Range Status   SARS Coronavirus 2 NEGATIVE NEGATIVE Final    Comment: (NOTE) SARS-CoV-2 target nucleic acids are NOT DETECTED.  The SARS-CoV-2 RNA is generally detectable in upper and lower respiratory specimens during the acute phase of infection. Negative results do not preclude SARS-CoV-2 infection, do not rule out co-infections with other pathogens, and should not be used as the sole basis for treatment or other patient management decisions. Negative results must be combined with clinical observations, patient history, and epidemiological information. The expected result is Negative.  Fact Sheet for Patients: SugarRoll.be  Fact Sheet for Healthcare Providers: https://www.woods-mathews.com/  This test is not yet approved  or cleared by the [place] and  has been authorized for detection and/or diagnosis of SARS-CoV-2 by FDA under an Emergency Use Authorization (EUA). This EUA will remain  in effect (meaning this test can be used) for the duration of the COVID-19 declaration under Se ction 564(b)(1) of the Act, 21 U.S.C. section 360bbb-3(b)(1), unless the authorization is terminated or revoked  sooner.  Performed at [facility], [place] [address], [place], [place] [address]     Coagulation Studies: No results for input(s): LABPROT, INR in the last 72 hours.  Urinalysis: No results for input(s): COLORURINE, LABSPEC, PHURINE, GLUCOSEU, HGBUR, BILIRUBINUR, KETONESUR, PROTEINUR, UROBILINOGEN, NITRITE, LEUKOCYTESUR in the last 72 hours.  Invalid input(s): APPERANCEUR    Imaging: DG Chest 2 View  Result Date: 06/14/2021 CLINICAL DATA:  Missed dialysis. EXAM: CHEST - 2 VIEW COMPARISON:  Chest x-ray dated June 02, 2021. FINDINGS: Unchanged enlarged, globular cardiopericardial silhouette likely reflecting cardiomegaly and pericardial effusion. New diffuse interstitial opacity. No focal consolidation, pleural effusion, or pneumothorax. No acute osseous abnormality. IMPRESSION: 1. New diffuse interstitial pulmonary edema. Electronically Signed   By: Titus Dubin M.D.   On: 06/14/2021 14:59     Medications:      ipratropium-albuterol  3 mL Nebulization Once   REM  Assessment/ Plan:  [name] is a 55 y.o.  male with a past medical history of alcohol abuse, anemia, via diastolic chronic heart failure, ACS, COPD, hypertension, end-stage renal disease on hemodialysis.  He presents to the emergency room complaining of shortness of breath.  He has been admitted for dialysis, rectal bleeding   [facility] [address]/MWF/Lt AVF/67kg  End-stage renal disease with fluid overload and acute pulmonary edema.  Will plan for urgent dialysis today as patient has missed several treatments.  Volume removal as tolerated.  Patient sometimes requires IV Benadryl '25mg'$  during dialysis for pruritis.  Reassess after hemodialysis treatment.  Potentially could be discharged.  Anemia of chronic kidney disease Lab Results  Component Value Date   HGB 7.5 (L) 06/14/2021  Hgb below target, will order EPO with HD treatment  3. Secondary Hyperparathyroidism:  Lab Results  Component  Value Date   PTH 669 (H) 02/20/2020   CALCIUM 9.1 06/14/2021   CAION 1.12 (L) 06/02/2021   PHOS 9.4 (H) 05/29/2021    Calcium and Phosphorus not at goal Will resume calcium acetate with meals Cinacalcet daily as outpatient  4. Hypertension Home regimen includes Hydralazine.  Blood pressure 142/80.  Acceptable.     LOS: 0 Deven Audi 9/23/20223:32 PM

## 2021-06-14 NOTE — ED Triage Notes (Signed)
Pt arrives via ems, pt states that he had dialysis last on Monday, missed Wednesday due to court, pt states that he feels like he has extra fluid over his body and is sob. Pt also states that he is passing fluid from his rectum and is uncertain what type of fluid that is, pt also states that he has been having chest pain since yesterday

## 2021-06-14 NOTE — ED Notes (Signed)
Per Dialysis Rn removed 3.5 L and vitals are stable at this time.

## 2021-06-14 NOTE — ED Provider Notes (Signed)
-----------------------------------------   9:51 PM on 06/14/2021 -----------------------------------------  Blood pressure 120/76, pulse 97, temperature 97.8 F (36.6 C), resp. rate 18, height '5\' 4"'$  (1.626 m), weight 72.6 kg, SpO2 100 %.  Assuming care from Dr. Quentin Cornwall.  In short, Jordan [name] is a 55 y.o. male with a chief complaint of Facial Swelling, Shortness of Breath, and Vascular Access Problem .  Refer to the original H&P for additional details.  The current plan of care is to await return from dialysis.  Patient presented back from dialysis, states that he feels much better than upon arrival.  This time patient is stable for discharge.  Patient was also complaining of a current scabies infection, requesting medication for same.  He has no other concerns and is ready for discharge at this time.  Patient will have prescription for permethrin.  Follow-up primary care as needed    ED diagnosis:  Acute pulmonary edema, End-stage renal disease on dialysis Scabies.    Brynda Peon 06/14/21 2159    Merlyn Lot, MD 06/25/21 [phone]

## 2021-06-14 NOTE — ED Notes (Signed)
Pt transported to dialysis

## 2021-06-14 NOTE — ED Provider Notes (Signed)
HPI: Pt is a 55 y.o. male who presents with complaints of swelling  The patient p/w  swelling in body. Missed dialysis Weds. Due today.   ROS: Denies fever, chest pain, vomiting  Past Medical History:  Diagnosis Date   Anemia    Aortic atherosclerosis ([place]) 11/12/2019   ED (erectile dysfunction)    Emphysema of lung ([place]) 11/12/2019   ESRD on hemodialysis ([place])    M/W/F in [place], [place]   ETOH abuse    History of blood transfusion    Hypertension    Wears dentures    Vitals:   06/14/21 1346  BP: 132/83  Pulse: 100  Resp: (!) 36  Temp: 97.7 F (36.5 C)  SpO2: 100%    Focused Physical Exam: Gen: No acute distress Head: atraumatic, normocephalic Eyes: Extraocular movements grossly intact; conjunctiva clear CV: RRR Lung: No increased WOB, no stridor GI: ND, no obvious masses Neuro: Alert and awake Some mild swelling notes.   Medical Decision Making and Plan: Given the patient's initial medical screening exam, the following diagnostic evaluation has been ordered. The patient will be placed in the appropriate treatment space, once one is available, to complete the evaluation and treatment. I have discussed the plan of care with the patient and I have advised the patient that an ED physician or mid-level practitioner will reevaluate their condition after the test results have been received, as the results may give them additional insight into the type of treatment they may need.   Diagnostics: ekg, trop, labs   Treatments: none immediately   Vanessa Mount Carroll, MD 06/14/21 1352

## 2021-06-14 NOTE — ED Notes (Signed)
Unsuccesful IV attempt x1 in right upper arm, gauze bandage applied. Attending provider notified, lab called to collect troponin, IV team consult entered.

## 2021-06-14 NOTE — Progress Notes (Signed)
Patient completed dialysis as ordered. 3.5 Liters removed over 3 hours. Patient tolerated well. Report given to Tally Joe RN.

## 2021-06-14 NOTE — ED Notes (Signed)
IV team at bedside 

## 2021-06-14 NOTE — ED Provider Notes (Addendum)
[facility] Emergency Department Provider Note    Event Date/Time   First MD Initiated Contact with Patient 06/14/21 1414     (approximate)  I have reviewed the triage vital signs and the nursing notes.   HISTORY  Chief Complaint Facial Swelling, Shortness of Breath, and Vascular Access Problem    HPI Jordan [name] is a 55 y.o. male presents to the ER for evaluation shortness of breath after missing dialysis on Wednesday.  Patient end-stage renal disease get months day Wednesday Friday dialysis.  States he missed a session on Wednesday because he was in jail.  States he was unable to go to clinic today because he was having worsening shortness of breath and increased O2 requirement.  Typically wears 2 L at night but having to wear it throughout the day endorsing exertional dyspnea as well as swelling in his legs and up to his face.  States his typical weight is around 67 kg.  He denies any chest pain.  Past Medical History:  Diagnosis Date   Anemia    Aortic atherosclerosis ([place]) 11/12/2019   ED (erectile dysfunction)    Emphysema of lung ([place]) 11/12/2019   ESRD on hemodialysis ([place])    M/W/F in [place], [place]   ETOH abuse    History of blood transfusion    Hypertension    Wears dentures    Family History  Problem Relation Age of Onset   Diabetes Mother    Kidney failure Mother    Healthy Father    Kidney failure Brother    Healthy Sister    Kidney disease Daughter    Post-traumatic stress disorder Neg Hx    Bladder Cancer Neg Hx    Kidney cancer Neg Hx    Past Surgical History:  Procedure Laterality Date   BASCILIC VEIN TRANSPOSITION Left 08/07/2016   Procedure: LEFT BASILIC VEIN TRANSPOSITION;  Surgeon: Angelia Mould, MD;  Location: [facility];  Service: Vascular;  Laterality: Left;   CLOSED REDUCTION NASAL FRACTURE N/A 08/11/2019   Procedure: CLOSED REDUCTION NASAL FRACTURE WITH STABILIZATION;  Surgeon: Irene Limbo, MD;   Location: [facility];  Service: Plastics;  Laterality: N/A;   COLONOSCOPY N/A 08/12/2016   Procedure: COLONOSCOPY;  Surgeon: Otis Brace, MD;  Location: [facility];  Service: Gastroenterology;  Laterality: N/A;   COLONOSCOPY WITH PROPOFOL N/A 06/05/2021   Procedure: COLONOSCOPY WITH PROPOFOL;  Surgeon: Sharyn Creamer, MD;  Location: [facility];  Service: Gastroenterology;  Laterality: N/A;   ESOPHAGOGASTRODUODENOSCOPY N/A 08/12/2016   Procedure: ESOPHAGOGASTRODUODENOSCOPY (EGD);  Surgeon: Otis Brace, MD;  Location: [facility];  Service: Gastroenterology;  Laterality: N/A;   ESOPHAGOGASTRODUODENOSCOPY (EGD) WITH PROPOFOL N/A 06/05/2021   Procedure: ESOPHAGOGASTRODUODENOSCOPY (EGD) WITH PROPOFOL;  Surgeon: Sharyn Creamer, MD;  Location: [facility];  Service: Gastroenterology;  Laterality: N/A;   INSERTION OF DIALYSIS CATHETER N/A 08/07/2016   Procedure: INSERTION OF TUNNELED DIALYSIS CATHETER;  Surgeon: Angelia Mould, MD;  Location: [facility];  Service: Vascular;  Laterality: N/A;   LIGATION OF ARTERIOVENOUS  FISTULA Left 09/12/2016   Procedure: BANDING OF LEFT  ARTERIOVENOUS  FISTULA;  Surgeon: Angelia Mould, MD;  Location: [facility];  Service: Vascular;  Laterality: Left;   LOWER EXTREMITY INTERVENTION Right 12/02/2018   Procedure: LOWER EXTREMITY INTERVENTION;  Surgeon: Algernon Huxley, MD;  Location: [facility];  Service: Cardiovascular;  Laterality: Right;   POLYPECTOMY  06/05/2021   Procedure: POLYPECTOMY;  Surgeon: Sharyn Creamer, MD;  Location: [facility] ENDOSCOPY;  Service: Gastroenterology;;   Patient Active Problem List   Diagnosis Date Noted   Protein-calorie malnutrition, severe 06/05/2021   Hypervolemia associated with renal insufficiency 06/02/2021   Symptomatic anemia 05/28/2021   Acute on chronic diastolic CHF (congestive heart failure) ([place]) 05/28/2021   Hyperkalemia 05/28/2021   Pulmonary edema 04/29/2021   Pruritus 04/29/2021   COVID-19 virus infection  04/29/2021   Thrombocytopenia ([place]) 04/08/2021   COPD (chronic obstructive pulmonary disease) ([place]) 04/08/2021   CHF (congestive heart failure) ([place]) 03/25/2021   PAF (paroxysmal atrial fibrillation) ([place]) 03/20/2021   Chronic diastolic CHF (congestive heart failure) ([place]) 03/20/2021   Alcohol abuse 03/20/2021   Vision loss of left eye 03/20/2021   SOB (shortness of breath) 03/20/2021   Shortness of breath 03/10/2021   Abscess of buttock 03/02/2021   Fluid overload 03/02/2021   Multifocal pneumonia 01/13/2021   Current mild episode of major depressive disorder without prior episode (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[place]) 02/03/2020   Acute on chronic combined systolic and diastolic CHF (congestive heart failure) ([place]) 02/03/2020   Acute respiratory failure with hypoxia ([place]) 11/12/2019   Aortic atherosclerosis ([place]) 11/12/2019   Emphysema of lung ([place]) 11/12/2019   Volume overload 10/05/2019   Elevated troponin 05/17/2018   Anemia in ESRD (end-stage renal disease) ([place]) 05/17/2018   ESRD on dialysis ([place]) 05/26/2017   Essential hypertension 05/26/2017   Moderate protein-calorie malnutrition ([place]) 08/13/2016   Secondary hyperparathyroidism of renal origin ([place]) 08/13/2016   Tobacco abuse 08/04/2016      Prior to Admission medications   Medication Sig Start Date End Date Taking? Authorizing Provider  albuterol (VENTOLIN HFA) 108 (90 Base) MCG/ACT inhaler Inhale 2 puffs into the lungs every 4 (four) hours as needed for wheezing or shortness of breath. 06/05/21   Danford, Suann Larry, MD  calcium acetate (PHOSLO) 667 MG capsule Take 2 capsules (1,334 mg total) by mouth 3 (three) times daily with meals. 06/05/21   Danford, Suann Larry, MD  cinacalcet (SENSIPAR) 30 MG tablet Take 1 tablet  (30 mg total) by mouth daily with breakfast. 06/05/21   Danford, Suann Larry, MD  docusate sodium (COLACE) 100 MG capsule Take 1 capsule (100 mg total) by mouth 2 (two) times daily. 06/05/21   Edwin Dada, MD  epoetin alfa (EPOGEN) 4000 UNIT/ML injection Inject 1 mL (4,000 Units total) into the vein every Monday, Wednesday, and Friday with hemodialysis. 06/22/20   Enzo Bi, MD  ferrous sulfate 325 (65 FE) MG tablet Take 1 tablet (325 mg total) by mouth 2 (two) times daily with a meal. 06/05/21   Danford, Suann Larry, MD  hydrALAZINE (APRESOLINE) 50 MG tablet Take 1 tablet (50 mg total) by mouth 3 (three) times daily. 06/05/21   Danford, Suann Larry, MD  pantoprazole (PROTONIX) 20 MG tablet Take 1 tablet (20 mg total) by mouth 2 (two) times daily before a meal. 06/05/21 08/04/21  Danford, Suann Larry, MD    Allergies Lisinopril    Social History Social History   Tobacco Use   Smoking status: Every Day    Packs/day: 0.50    Years: 40.00    Pack years: 20.00    Types: Cigarettes   Smokeless tobacco: Never  Vaping Use   Vaping Use: Never used  Substance Use Topics   Alcohol use: Not Currently  Alcohol/week: 21.0 standard drinks    Types: 21 Cans of beer per week    Comment: last drink 6 months ago   Drug use: No    Review of Systems Patient denies headaches, rhinorrhea, blurry vision, numbness, shortness of breath, chest pain, edema, cough, abdominal pain, nausea, vomiting, diarrhea, dysuria, fevers, rashes or hallucinations unless otherwise stated above in HPI. ____________________________________________   PHYSICAL EXAM:  VITAL SIGNS: Vitals:   06/14/21 1346 06/14/21 1532  BP: 132/83 (!) 142/80  Pulse: 100 91  Resp: (!) 36 20  Temp: 97.7 F (36.5 C) 97.9 F (36.6 C)  SpO2: 100% 100%    Constitutional: Alert and oriented.  Eyes: Conjunctivae are normal.  Head: Atraumatic. Nose: No congestion/rhinnorhea. Mouth/Throat: Mucous membranes are moist.    Neck: No stridor. Painless ROM.  Cardiovascular: Normal rate, regular rhythm. Grossly normal heart sounds.  Good peripheral circulation. Respiratory: Normal respiratory effort.  No retractions. Lungs with inspiratory crackles posteriorly, scattered expiratory wheeze Gastrointestinal: Soft and nontender. No distention. No abdominal bruits. No CVA tenderness. Genitourinary:  Musculoskeletal: No lower extremity tenderness nor edema.  No joint effusions. Neurologic:  Normal speech and language. No gross focal neurologic deficits are appreciated. No facial droop Skin:  Skin is warm, dry and intact. No rash noted. Psychiatric: Mood and affect are normal. Speech and behavior are normal.  ____________________________________________   LABS (all labs ordered are listed, but only abnormal results are displayed)  Results for orders placed or performed during the hospital encounter of 06/14/21 (from the past 24 hour(s))  Basic metabolic panel     Status: Abnormal   Collection Time: 06/14/21  1:59 PM  Result Value Ref Range   Sodium 143 135 - 145 mmol/L   Potassium 4.9 3.5 - 5.1 mmol/L   Chloride 103 98 - 111 mmol/L   CO2 19 (L) 22 - 32 mmol/L   Glucose, Bld 120 (H) 70 - 99 mg/dL   BUN 95 (H) 6 - 20 mg/dL   Creatinine, Ser 12.91 (H) 0.61 - 1.24 mg/dL   Calcium 9.1 8.9 - 10.3 mg/dL   GFR, Estimated 4 (L) >60 mL/min   Anion gap 21 (H) 5 - 15  CBC     Status: Abnormal   Collection Time: 06/14/21  1:59 PM  Result Value Ref Range   WBC 7.3 4.0 - 10.5 K/uL   RBC 2.35 (L) 4.22 - 5.81 MIL/uL   Hemoglobin 7.5 (L) 13.0 - 17.0 g/dL   HCT 24.5 (L) 39.0 - 52.0 %   MCV 104.3 (H) 80.0 - 100.0 fL   MCH 31.9 26.0 - 34.0 pg   MCHC 30.6 30.0 - 36.0 g/dL   RDW 17.8 (H) 11.5 - 15.5 %   Platelets 117 (L) 150 - 400 K/uL   nRBC 0.3 (H) 0.0 - 0.2 %  Troponin I (High Sensitivity)     Status: Abnormal   Collection Time: 06/14/21  1:59 PM  Result Value Ref Range   Troponin I (High Sensitivity) 136 (HH) <18  ng/L  Hepatic function panel     Status: Abnormal   Collection Time: 06/14/21  1:59 PM  Result Value Ref Range   Total Protein 7.3 6.5 - 8.1 g/dL   Albumin 3.4 (L) 3.5 - 5.0 g/dL   AST 18 15 - 41 U/L   ALT 17 0 - 44 U/L   Alkaline Phosphatase 93 38 - 126 U/L   Total Bilirubin 1.6 (H) 0.3 - 1.2 mg/dL   Bilirubin, Direct 0.3 (H) 0.0 -  0.2 mg/dL   Indirect Bilirubin 1.3 (H) 0.3 - 0.9 mg/dL   ____________________________________________  EKG My review and personal interpretation at Time: 13:50   Indication: esrd  Rate: 95  Rhythm: sinus Axis: normal Other: normal intervals, no stemi ____________________________________________  RADIOLOGY  I personally reviewed all radiographic images ordered to evaluate for the above acute complaints and reviewed radiology reports and findings.  These findings were personally discussed with the patient.  Please see medical record for radiology report.  ____________________________________________   PROCEDURES  Procedure(s) performed:  Procedures    Critical Care performed: no ____________________________________________   INITIAL IMPRESSION / ASSESSMENT AND PLAN / ED COURSE  Pertinent labs & imaging results that were available during my care of the patient were reviewed by me and considered in my medical decision making (see chart for details).   DDX: Asthma, copd, CHF, pna, ptx, malignancy, Pe, anemia   JOZSEF BROUSE is a 55 y.o. who presents to the ED with presentation as described above.  Patient with O2 requirement pulmonary edema on chest x-ray EKG is nonischemic is not complaining of any chest pain right now, trop elevated but not consistent with acs.  More likely secondary to chf and esrd on dialysis.  Will consult nephrology for dialysis.  Clinical Course as of 06/14/21 1945  Fri Jun 14, 2021  1527 Discussed case in consultation with Dr. Candiss Norse of nephrology who has now evaluated patient at bedside.  We will plan for dialysis  and then reassessment.  Likely he will be okay for discharge after dialysis. [PR]    Clinical Course User Index [PR] Merlyn Lot, MD    The patient was evaluated in Emergency Department today for the symptoms described in the history of present illness. He/she was evaluated in the context of the global COVID-19 pandemic, which necessitated consideration that the patient might be at risk for infection with the SARS-CoV-2 virus that causes COVID-19. Institutional protocols and algorithms that pertain to the evaluation of patients at risk for COVID-19 are in a state of rapid change based on information released by regulatory bodies including the CDC and federal and state organizations. These policies and algorithms were followed during the patient's care in the ED.  As part of my medical decision making, I reviewed the following data within the [facility] notes reviewed and incorporated, Labs reviewed, notes from prior ED visits and [place] Controlled Substance Database   ____________________________________________   FINAL CLINICAL IMPRESSION(S) / ED DIAGNOSES  Final diagnoses:  Acute pulmonary edema ([place])  ESRD on dialysis ([place])      NEW MEDICATIONS STARTED DURING THIS VISIT:  New Prescriptions   No medications on file     Note:  This document was prepared using Dragon voice recognition software and may include unintentional dictation errors.     Merlyn Lot, MD 06/14/21 1946

## 2021-06-26 ENCOUNTER — Emergency Department: Payer: Medicare Other

## 2021-06-26 ENCOUNTER — Other Ambulatory Visit: Payer: Self-pay

## 2021-06-26 ENCOUNTER — Inpatient Hospital Stay
Admission: EM | Admit: 2021-06-26 | Discharge: 2021-07-05 | DRG: 291 | Disposition: A | Discharge status: Home Health | Payer: Medicare Other | Attending: Internal Medicine | Admitting: Internal Medicine

## 2021-06-26 DIAGNOSIS — Z833 Family history of diabetes mellitus: Secondary | ICD-10-CM

## 2021-06-26 DIAGNOSIS — E875 Hyperkalemia: Secondary | ICD-10-CM | POA: Diagnosis not present

## 2021-06-26 DIAGNOSIS — E162 Hypoglycemia, unspecified: Secondary | ICD-10-CM | POA: Diagnosis not present

## 2021-06-26 DIAGNOSIS — X58XXXA Exposure to other specified factors, initial encounter: Secondary | ICD-10-CM | POA: Diagnosis not present

## 2021-06-26 DIAGNOSIS — J9621 Acute and chronic respiratory failure with hypoxia: Secondary | ICD-10-CM

## 2021-06-26 DIAGNOSIS — L299 Pruritus, unspecified: Secondary | ICD-10-CM | POA: Diagnosis present

## 2021-06-26 DIAGNOSIS — K921 Melena: Secondary | ICD-10-CM | POA: Diagnosis not present

## 2021-06-26 DIAGNOSIS — Z841 Family history of disorders of kidney and ureter: Secondary | ICD-10-CM

## 2021-06-26 DIAGNOSIS — R0602 Shortness of breath: Secondary | ICD-10-CM | POA: Diagnosis present

## 2021-06-26 DIAGNOSIS — G9341 Metabolic encephalopathy: Secondary | ICD-10-CM | POA: Diagnosis present

## 2021-06-26 DIAGNOSIS — R109 Unspecified abdominal pain: Secondary | ICD-10-CM

## 2021-06-26 DIAGNOSIS — Z20822 Contact with and (suspected) exposure to covid-19: Secondary | ICD-10-CM | POA: Diagnosis present

## 2021-06-26 DIAGNOSIS — I3139 Other pericardial effusion (noninflammatory): Secondary | ICD-10-CM | POA: Diagnosis present

## 2021-06-26 DIAGNOSIS — J449 Chronic obstructive pulmonary disease, unspecified: Secondary | ICD-10-CM | POA: Diagnosis present

## 2021-06-26 DIAGNOSIS — Z992 Dependence on renal dialysis: Secondary | ICD-10-CM | POA: Diagnosis not present

## 2021-06-26 DIAGNOSIS — I700 Atherosclerosis of aorta: Secondary | ICD-10-CM | POA: Diagnosis present

## 2021-06-26 DIAGNOSIS — M79604 Pain in right leg: Secondary | ICD-10-CM

## 2021-06-26 DIAGNOSIS — I5033 Acute on chronic diastolic (congestive) heart failure: Secondary | ICD-10-CM

## 2021-06-26 DIAGNOSIS — R188 Other ascites: Secondary | ICD-10-CM

## 2021-06-26 DIAGNOSIS — R7989 Other specified abnormal findings of blood chemistry: Secondary | ICD-10-CM | POA: Diagnosis present

## 2021-06-26 DIAGNOSIS — I5043 Acute on chronic combined systolic (congestive) and diastolic (congestive) heart failure: Secondary | ICD-10-CM | POA: Diagnosis present

## 2021-06-26 DIAGNOSIS — D72829 Elevated white blood cell count, unspecified: Secondary | ICD-10-CM | POA: Diagnosis not present

## 2021-06-26 DIAGNOSIS — Z6832 Body mass index (BMI) 32.0-32.9, adult: Secondary | ICD-10-CM | POA: Diagnosis not present

## 2021-06-26 DIAGNOSIS — R778 Other specified abnormalities of plasma proteins: Secondary | ICD-10-CM | POA: Diagnosis present

## 2021-06-26 DIAGNOSIS — D6489 Other specified anemias: Secondary | ICD-10-CM | POA: Diagnosis present

## 2021-06-26 DIAGNOSIS — N529 Male erectile dysfunction, unspecified: Secondary | ICD-10-CM | POA: Diagnosis present

## 2021-06-26 DIAGNOSIS — R000 Tachycardia, unspecified: Secondary | ICD-10-CM | POA: Diagnosis present

## 2021-06-26 DIAGNOSIS — N186 End stage renal disease: Secondary | ICD-10-CM | POA: Diagnosis present

## 2021-06-26 DIAGNOSIS — Z9115 Patient's noncompliance with renal dialysis: Secondary | ICD-10-CM

## 2021-06-26 DIAGNOSIS — I10 Essential (primary) hypertension: Secondary | ICD-10-CM | POA: Diagnosis present

## 2021-06-26 DIAGNOSIS — I4891 Unspecified atrial fibrillation: Secondary | ICD-10-CM | POA: Diagnosis present

## 2021-06-26 DIAGNOSIS — E669 Obesity, unspecified: Secondary | ICD-10-CM | POA: Diagnosis present

## 2021-06-26 DIAGNOSIS — D631 Anemia in chronic kidney disease: Secondary | ICD-10-CM | POA: Diagnosis present

## 2021-06-26 DIAGNOSIS — T783XXA Angioneurotic edema, initial encounter: Secondary | ICD-10-CM | POA: Diagnosis not present

## 2021-06-26 DIAGNOSIS — I132 Hypertensive heart and chronic kidney disease with heart failure and with stage 5 chronic kidney disease, or end stage renal disease: Secondary | ICD-10-CM | POA: Diagnosis present

## 2021-06-26 DIAGNOSIS — G4733 Obstructive sleep apnea (adult) (pediatric): Secondary | ICD-10-CM | POA: Diagnosis present

## 2021-06-26 DIAGNOSIS — N2581 Secondary hyperparathyroidism of renal origin: Secondary | ICD-10-CM | POA: Diagnosis present

## 2021-06-26 DIAGNOSIS — Z66 Do not resuscitate: Secondary | ICD-10-CM | POA: Diagnosis present

## 2021-06-26 DIAGNOSIS — I160 Hypertensive urgency: Secondary | ICD-10-CM | POA: Diagnosis present

## 2021-06-26 DIAGNOSIS — I081 Rheumatic disorders of both mitral and tricuspid valves: Secondary | ICD-10-CM | POA: Diagnosis present

## 2021-06-26 DIAGNOSIS — J9622 Acute and chronic respiratory failure with hypercapnia: Secondary | ICD-10-CM

## 2021-06-26 DIAGNOSIS — J439 Emphysema, unspecified: Secondary | ICD-10-CM | POA: Diagnosis present

## 2021-06-26 DIAGNOSIS — M79605 Pain in left leg: Secondary | ICD-10-CM

## 2021-06-26 DIAGNOSIS — I480 Paroxysmal atrial fibrillation: Secondary | ICD-10-CM | POA: Diagnosis present

## 2021-06-26 DIAGNOSIS — K219 Gastro-esophageal reflux disease without esophagitis: Secondary | ICD-10-CM | POA: Diagnosis present

## 2021-06-26 DIAGNOSIS — D696 Thrombocytopenia, unspecified: Secondary | ICD-10-CM | POA: Diagnosis present

## 2021-06-26 DIAGNOSIS — E8770 Fluid overload, unspecified: Secondary | ICD-10-CM | POA: Diagnosis not present

## 2021-06-26 DIAGNOSIS — F17210 Nicotine dependence, cigarettes, uncomplicated: Secondary | ICD-10-CM | POA: Diagnosis present

## 2021-06-26 DIAGNOSIS — Z8719 Personal history of other diseases of the digestive system: Secondary | ICD-10-CM

## 2021-06-26 DIAGNOSIS — I248 Other forms of acute ischemic heart disease: Secondary | ICD-10-CM | POA: Diagnosis present

## 2021-06-26 DIAGNOSIS — M898X9 Other specified disorders of bone, unspecified site: Secondary | ICD-10-CM | POA: Diagnosis present

## 2021-06-26 DIAGNOSIS — R221 Localized swelling, mass and lump, neck: Secondary | ICD-10-CM | POA: Diagnosis not present

## 2021-06-26 DIAGNOSIS — T782XXA Anaphylactic shock, unspecified, initial encounter: Secondary | ICD-10-CM

## 2021-06-26 LAB — CBC WITH DIFFERENTIAL/PLATELET
Abs Immature Granulocytes: 0.19 10*3/uL — ABNORMAL HIGH (ref 0.00–0.07)
Basophils Absolute: 0 10*3/uL (ref 0.0–0.1)
Basophils Relative: 0 %
Eosinophils Absolute: 0.2 10*3/uL (ref 0.0–0.5)
Eosinophils Relative: 2 %
HCT: 23 % — ABNORMAL LOW (ref 39.0–52.0)
Hemoglobin: 7.1 g/dL — ABNORMAL LOW (ref 13.0–17.0)
Immature Granulocytes: 2 %
Lymphocytes Relative: 7 %
Lymphs Abs: 0.6 10*3/uL — ABNORMAL LOW (ref 0.7–4.0)
MCH: 31.4 pg (ref 26.0–34.0)
MCHC: 30.9 g/dL (ref 30.0–36.0)
MCV: 101.8 fL — ABNORMAL HIGH (ref 80.0–100.0)
Monocytes Absolute: 1 10*3/uL (ref 0.1–1.0)
Monocytes Relative: 10 %
Neutro Abs: 7.6 10*3/uL (ref 1.7–7.7)
Neutrophils Relative %: 79 %
Platelets: 150 10*3/uL (ref 150–400)
RBC: 2.26 MIL/uL — ABNORMAL LOW (ref 4.22–5.81)
RDW: 15.8 % — ABNORMAL HIGH (ref 11.5–15.5)
WBC: 9.6 10*3/uL (ref 4.0–10.5)
nRBC: 0 % (ref 0.0–0.2)

## 2021-06-26 LAB — BASIC METABOLIC PANEL
Anion gap: 16 — ABNORMAL HIGH (ref 5–15)
BUN: 72 mg/dL — ABNORMAL HIGH (ref 6–20)
CO2: 26 mmol/L (ref 22–32)
Calcium: 9.2 mg/dL (ref 8.9–10.3)
Chloride: 97 mmol/L — ABNORMAL LOW (ref 98–111)
Creatinine, Ser: 11.47 mg/dL — ABNORMAL HIGH (ref 0.61–1.24)
GFR, Estimated: 5 mL/min — ABNORMAL LOW (ref >60)
Glucose, Bld: 93 mg/dL (ref 70–99)
Potassium: 4 mmol/L (ref 3.5–5.1)
Sodium: 139 mmol/L (ref 135–145)

## 2021-06-26 LAB — BLOOD GAS, ARTERIAL
Acid-Base Excess: 2.3 mmol/L — ABNORMAL HIGH (ref 0.0–2.0)
Bicarbonate: 26.4 mmol/L (ref 20.0–28.0)
FIO2: 1
O2 Saturation: 99.5 %
Patient temperature: 37
pCO2 arterial: 38 mmHg (ref 32.0–48.0)
pH, Arterial: 7.45 (ref 7.350–7.450)
pO2, Arterial: 161 mmHg — ABNORMAL HIGH (ref 83.0–108.0)

## 2021-06-26 LAB — PHOSPHORUS: Phosphorus: 9.6 mg/dL — ABNORMAL HIGH (ref 2.5–4.6)

## 2021-06-26 LAB — COMPREHENSIVE METABOLIC PANEL
ALT: 14 U/L (ref 0–44)
AST: 20 U/L (ref 15–41)
Albumin: 3.9 g/dL (ref 3.5–5.0)
Alkaline Phosphatase: 102 U/L (ref 38–126)
Anion gap: 23 — ABNORMAL HIGH (ref 5–15)
BUN: 107 mg/dL — ABNORMAL HIGH (ref 6–20)
CO2: 20 mmol/L — ABNORMAL LOW (ref 22–32)
Calcium: 9.4 mg/dL (ref 8.9–10.3)
Chloride: 102 mmol/L (ref 98–111)
Creatinine, Ser: 18.65 mg/dL — ABNORMAL HIGH (ref 0.61–1.24)
GFR, Estimated: 3 mL/min — ABNORMAL LOW (ref >60)
Glucose, Bld: 95 mg/dL (ref 70–99)
Potassium: 6.1 mmol/L — ABNORMAL HIGH (ref 3.5–5.1)
Sodium: 145 mmol/L (ref 135–145)
Total Bilirubin: 1.6 mg/dL — ABNORMAL HIGH (ref 0.3–1.2)
Total Protein: 8.8 g/dL — ABNORMAL HIGH (ref 6.5–8.1)

## 2021-06-26 LAB — BRAIN NATRIURETIC PEPTIDE: B Natriuretic Peptide: 4471.7 pg/mL — ABNORMAL HIGH (ref 0.0–100.0)

## 2021-06-26 LAB — HEPATITIS B SURFACE ANTIBODY,QUALITATIVE: Hep B S Ab: REACTIVE — AB

## 2021-06-26 LAB — MAGNESIUM: Magnesium: 2.5 mg/dL — ABNORMAL HIGH (ref 1.7–2.4)

## 2021-06-26 LAB — HEPATITIS B SURFACE ANTIGEN: Hepatitis B Surface Ag: NONREACTIVE

## 2021-06-26 LAB — TROPONIN I (HIGH SENSITIVITY)
Troponin I (High Sensitivity): 144 ng/L (ref <18)
Troponin I (High Sensitivity): 140 ng/L (ref <18)

## 2021-06-26 LAB — RESP PANEL BY RT-PCR (FLU A&B, COVID) ARPGX2
Influenza A by PCR: NEGATIVE
Influenza B by PCR: NEGATIVE
SARS Coronavirus 2 by RT PCR: NEGATIVE

## 2021-06-26 LAB — LACTIC ACID, PLASMA: Lactic Acid, Venous: 1.6 mmol/L (ref 0.5–1.9)

## 2021-06-26 IMAGING — DX DG CHEST 1V PORT
1 series · 1 of 1 positions shown · non-contrast
Comparison: None.

CLINICAL DATA: Short of breath

EXAM:
PORTABLE CHEST 1 VIEW

[chest ap]
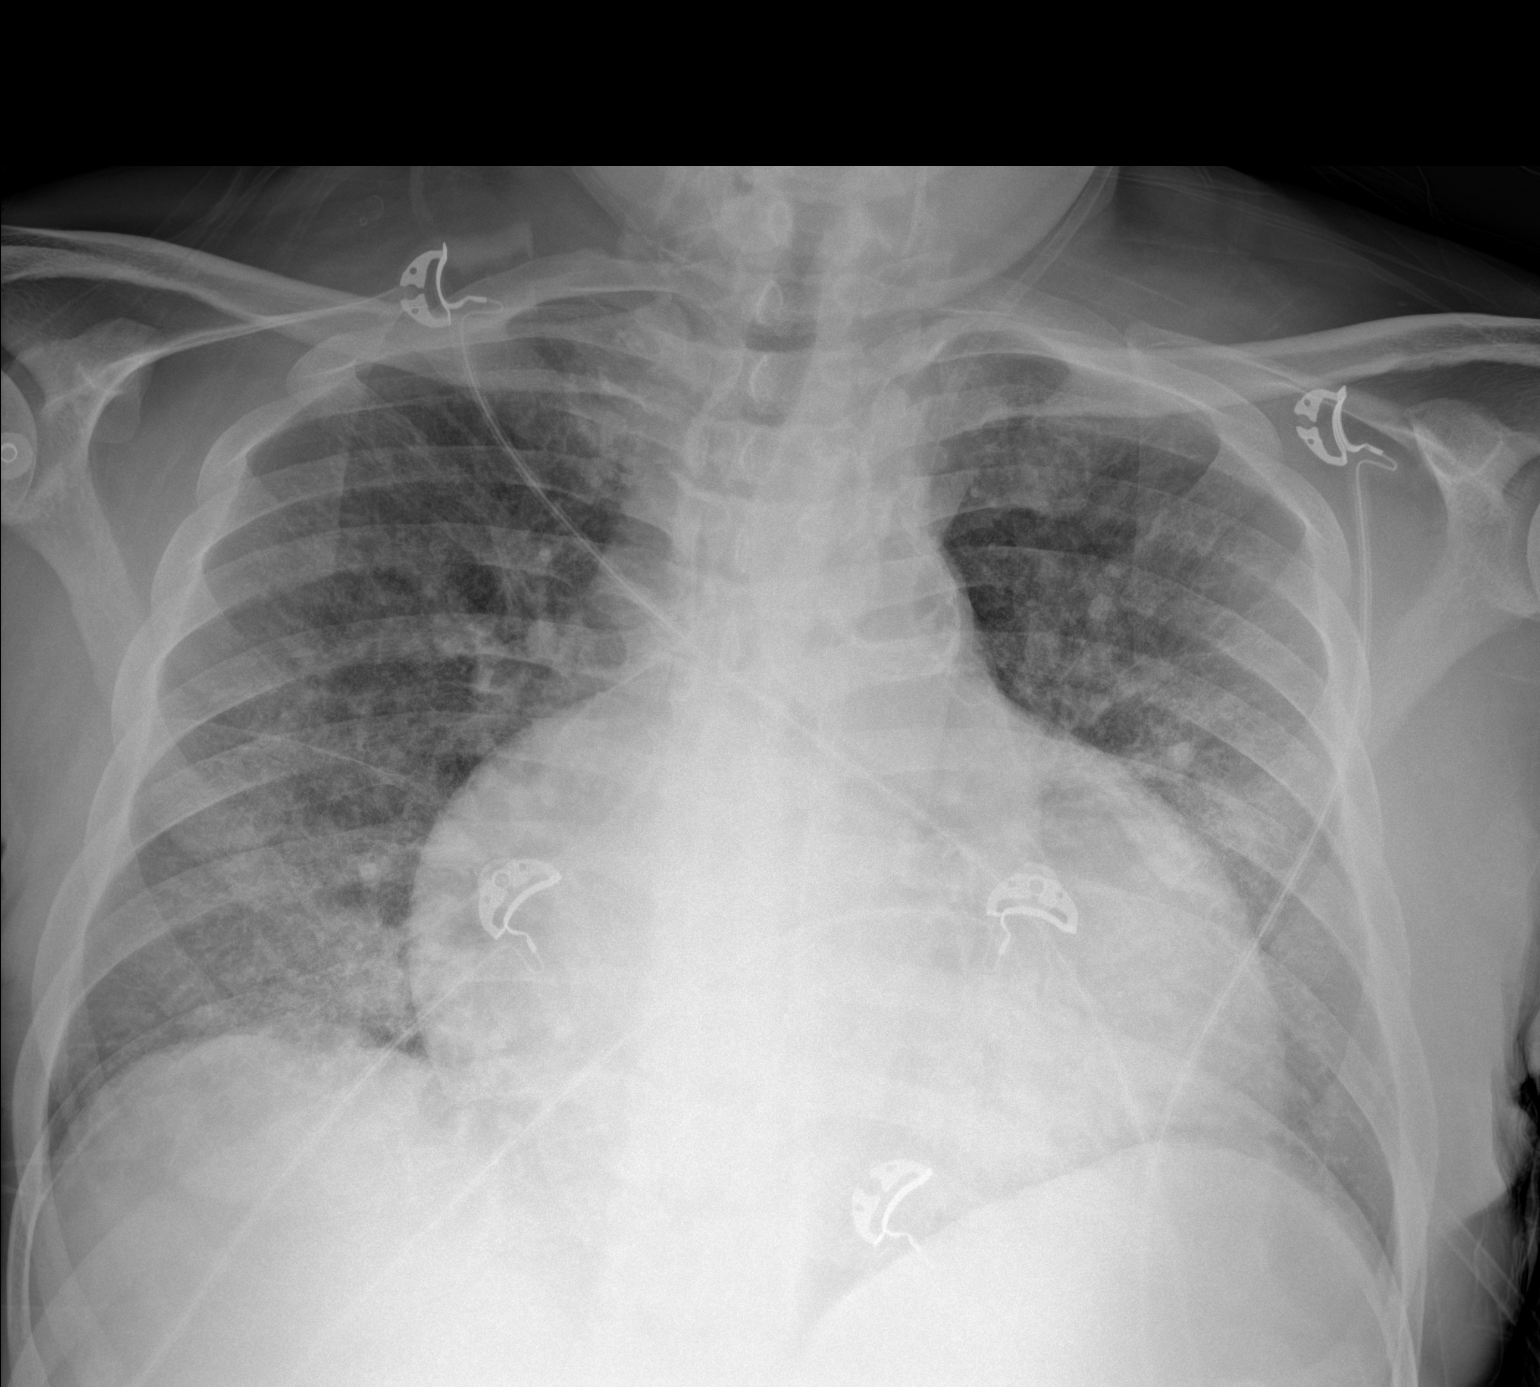

[1 of 1 positions shown; findings below may reference images not displayed]

FINDINGS: Stable enlarged cardiac silhouette. There is diffuse mild airspace
disease. No focal consolidation. Pleural fluid or pneumothorax.
IMPRESSION: Cardiomegaly and pulmonary edema pattern. Potential component of
pericardial effusion.

## 2021-06-26 MED ORDER — ACETAMINOPHEN 650 MG RE SUPP
650.0000 mg | Freq: Four times a day (QID) | RECTAL | Status: DC | PRN
  Filled 2021-06-26: qty 1

## 2021-06-26 MED ORDER — DILTIAZEM HCL-DEXTROSE 125-5 MG/125ML-% IV SOLN (PREMIX)
5.0000 mg/h | INTRAVENOUS | Status: DC
  Administered 2021-06-26: 5 mg/h via INTRAVENOUS
  Administered 2021-06-27: 15 mg/h via INTRAVENOUS
  Filled 2021-06-26 (×2): qty 125

## 2021-06-26 MED ORDER — MAGNESIUM HYDROXIDE 400 MG/5ML PO SUSP: 30.0000 mL | Freq: Every day | ORAL | Status: DC | PRN

## 2021-06-26 MED ORDER — PROMETHAZINE HCL 25 MG PO TABS
12.5000 mg | ORAL_TABLET | Freq: Four times a day (QID) | ORAL | Status: DC | PRN
  Filled 2021-06-26: qty 1

## 2021-06-26 MED ORDER — CALCIUM GLUCONATE-NACL 1-0.675 GM/50ML-% IV SOLN
1.0000 g | Freq: Once | INTRAVENOUS | Status: AC
  Administered 2021-06-26: 1000 mg via INTRAVENOUS
  Filled 2021-06-26: qty 50

## 2021-06-26 MED ORDER — CINACALCET HCL 30 MG PO TABS
30.0000 mg | ORAL_TABLET | Freq: Every day | ORAL | Status: DC
  Administered 2021-06-27: 30 mg via ORAL
  Filled 2021-06-26 (×2): qty 1

## 2021-06-26 MED ORDER — INSULIN ASPART 100 UNIT/ML IJ SOLN
7.0000 [IU] | Freq: Once | INTRAMUSCULAR | Status: AC
  Administered 2021-06-26: 7 [IU] via INTRAVENOUS
  Filled 2021-06-26: qty 1

## 2021-06-26 MED ORDER — CALCIUM ACETATE (PHOS BINDER) 667 MG PO CAPS
1334.0000 mg | ORAL_CAPSULE | Freq: Three times a day (TID) | ORAL | Status: DC
  Administered 2021-06-27 (×2): 1334 mg via ORAL
  Filled 2021-06-26 (×6): qty 2

## 2021-06-26 MED ORDER — FUROSEMIDE 10 MG/ML IJ SOLN
40.0000 mg | Freq: Two times a day (BID) | INTRAMUSCULAR | Status: DC
  Administered 2021-06-26 – 2021-06-28 (×4): 40 mg via INTRAVENOUS
  Filled 2021-06-26 (×5): qty 4

## 2021-06-26 MED ORDER — PANTOPRAZOLE SODIUM 20 MG PO TBEC
20.0000 mg | DELAYED_RELEASE_TABLET | Freq: Two times a day (BID) | ORAL | Status: DC
  Administered 2021-06-27: 20 mg via ORAL
  Filled 2021-06-26 (×3): qty 1

## 2021-06-26 MED ORDER — ALBUTEROL SULFATE HFA 108 (90 BASE) MCG/ACT IN AERS
2.0000 | INHALATION_SPRAY | RESPIRATORY_TRACT | Status: DC | PRN
  Filled 2021-06-26: qty 6.7

## 2021-06-26 MED ORDER — ACETAMINOPHEN 325 MG PO TABS
650.0000 mg | ORAL_TABLET | Freq: Four times a day (QID) | ORAL | Status: DC | PRN
  Administered 2021-07-01 – 2021-07-04 (×2): 650 mg via ORAL
  Filled 2021-06-26 (×2): qty 2

## 2021-06-26 MED ORDER — DOCUSATE SODIUM 100 MG PO CAPS
100.0000 mg | ORAL_CAPSULE | Freq: Two times a day (BID) | ORAL | Status: DC
  Administered 2021-06-26 – 2021-06-27 (×2): 100 mg via ORAL
  Filled 2021-06-26 (×3): qty 1

## 2021-06-26 MED ORDER — DILTIAZEM LOAD VIA INFUSION
10.0000 mg | Freq: Once | INTRAVENOUS | Status: AC
  Administered 2021-06-26: 10 mg via INTRAVENOUS
  Filled 2021-06-26: qty 10

## 2021-06-26 MED ORDER — DEXTROSE 50 % IV SOLN
1.0000 | Freq: Once | INTRAVENOUS | Status: AC
  Administered 2021-06-26: 50 mL via INTRAVENOUS
  Filled 2021-06-26: qty 50

## 2021-06-26 MED ORDER — CHLORHEXIDINE GLUCONATE CLOTH 2 % EX PADS
6.0000 | MEDICATED_PAD | Freq: Every day | CUTANEOUS | Status: DC
  Administered 2021-06-28 – 2021-07-04 (×6): 6 via TOPICAL
  Filled 2021-06-26 (×3): qty 6

## 2021-06-26 MED ORDER — HEPARIN SODIUM (PORCINE) 5000 UNIT/ML IJ SOLN
5000.0000 [IU] | Freq: Three times a day (TID) | INTRAMUSCULAR | Status: DC
  Administered 2021-06-26 – 2021-06-28 (×4): 5000 [IU] via SUBCUTANEOUS
  Filled 2021-06-26 (×4): qty 1

## 2021-06-26 MED ORDER — HYDRALAZINE HCL 50 MG PO TABS
50.0000 mg | ORAL_TABLET | Freq: Three times a day (TID) | ORAL | Status: DC
  Administered 2021-06-26 – 2021-06-27 (×2): 50 mg via ORAL
  Filled 2021-06-26 (×3): qty 1

## 2021-06-26 MED ORDER — FERROUS SULFATE 325 (65 FE) MG PO TABS
325.0000 mg | ORAL_TABLET | Freq: Two times a day (BID) | ORAL | Status: DC
  Administered 2021-06-27 – 2021-07-05 (×10): 325 mg via ORAL
  Filled 2021-06-26 (×10): qty 1

## 2021-06-26 MED ORDER — TRAZODONE HCL 50 MG PO TABS: 25.0000 mg | ORAL_TABLET | Freq: Every evening | ORAL | Status: DC | PRN

## 2021-06-26 NOTE — ED Notes (Signed)
Pt to dialysis.

## 2021-06-26 NOTE — H&P (Addendum)
[place]   PATIENT NAME: Jordan [name]    MR#:  [id number]  DATE OF BIRTH:  June 07, 1966  DATE OF ADMISSION:  06/26/2021  PRIMARY CARE PHYSICIAN: Pcp, No   Patient is coming from: Home  REQUESTING/REFERRING PHYSICIAN: Hulan Saas, MD  CHIEF COMPLAINT:   Chief Complaint  Patient presents with  . Chest Pain  . Shortness of Breath    HISTORY OF PRESENT ILLNESS:  Jordan [name] is a 55 y.o. African-American male with medical history significant for end-stage renal disease on hemodialysis Monday Wednesday and Fridays, hypertension, emphysema with noncompliance to hemodialysis, who presented to the emergency room with acute onset of worsening dyspnea with associated lower extremity edema, orthopnea and paroxysmal nocturnal dyspnea as well as dyspnea on exertion.  The patient has not been getting hemodialysis for about a week.  He stated that he was in jail and was just released a couple of days ago.  He was taken to hemodialysis earlier this morning and after coming back to the ER started having chest pain and palpitations and was noted to be in atrial fibrillation with RVR.  He denies any nausea or vomiting or diaphoresis.  No headache or dizziness or blurred vision.  No diarrhea or constipation.  No melena or bright red bleeding per rectum.  No other bleeding diathesis.  ED Course: Upon presenting to the ER, blood pressure was 153/108 with respiratory rate of 25 and otherwise normal vital signs.  Labs revealed a BUN of 107 and a creatinine of 18.65 with a potassium of 6.1 earlier this morning and CBC showed hemoglobin of 7.1 and hematocrit 23 close to baseline.  High-sensitivity troponin I was 144 and later 140.  Influenza antigens and COVID-19 PCR came back negative.  With an ABG with pH 7.45 and PCO2 of 38 8 and PO2 161 HCO3 26.4 with O2 sat of 99.5%. EKG as reviewed by me : Showed sinus tachycardia with rate of 108 with low voltage QRS and prolonged QT interval with QTC 519  MS. EKG later on revealed A. fib with RVR of 118 with borderline right axis deviation and prolonged QT interval. Imaging: Chest x-ray showed cardiomegaly and pulmonary edema with potential component of pericardial effusion.  The patient was given a gram of IV calcium gluconate and D50/and sliding for hyperkalemia.  When he developed atrial fibrillation this afternoon he was given a bolus of 10 mg of IV Cardizem followed by IV Cardizem drip.  He will be admitted to a progressive unit bed for further evaluation and management. PAST MEDICAL HISTORY:   Past Medical History:  Diagnosis Date  . Anemia   . Aortic atherosclerosis ([place]) 11/12/2019  . ED (erectile dysfunction)   . Emphysema of lung ([place]) 11/12/2019  . ESRD on hemodialysis ([place])    M/W/F in [place], [place]  . ETOH abuse   . History of blood transfusion   . Hypertension   . Wears dentures     PAST SURGICAL HISTORY:   Past Surgical History:  Procedure Laterality Date  . BASCILIC VEIN TRANSPOSITION Left 08/07/2016   Procedure: LEFT BASILIC VEIN TRANSPOSITION;  Surgeon: Angelia Mould, MD;  Location: [facility];  Service: Vascular;  Laterality: Left;  . CLOSED REDUCTION NASAL FRACTURE N/A 08/11/2019   Procedure: CLOSED REDUCTION NASAL FRACTURE WITH STABILIZATION;  Surgeon: Irene Limbo, MD;  Location: [facility];  Service: Plastics;  Laterality: N/A;  . COLONOSCOPY N/A 08/12/2016   Procedure: COLONOSCOPY;  Surgeon: Otis Brace, MD;  Location:  [facility];  Service: Gastroenterology;  Laterality: N/A;  . COLONOSCOPY WITH PROPOFOL N/A 06/05/2021   Procedure: COLONOSCOPY WITH PROPOFOL;  Surgeon: Sharyn Creamer, MD;  Location: [facility];  Service: Gastroenterology;  Laterality: N/A;  . ESOPHAGOGASTRODUODENOSCOPY N/A 08/12/2016   Procedure: ESOPHAGOGASTRODUODENOSCOPY (EGD);  Surgeon: Otis Brace, MD;  Location: [facility];  Service: Gastroenterology;  Laterality: N/A;  . ESOPHAGOGASTRODUODENOSCOPY (EGD) WITH PROPOFOL  N/A 06/05/2021   Procedure: ESOPHAGOGASTRODUODENOSCOPY (EGD) WITH PROPOFOL;  Surgeon: Sharyn Creamer, MD;  Location: [facility];  Service: Gastroenterology;  Laterality: N/A;  . INSERTION OF DIALYSIS CATHETER N/A 08/07/2016   Procedure: INSERTION OF TUNNELED DIALYSIS CATHETER;  Surgeon: Angelia Mould, MD;  Location: [facility];  Service: Vascular;  Laterality: N/A;  . LIGATION OF ARTERIOVENOUS  FISTULA Left 09/12/2016   Procedure: BANDING OF LEFT  ARTERIOVENOUS  FISTULA;  Surgeon: Angelia Mould, MD;  Location: [facility];  Service: Vascular;  Laterality: Left;  . LOWER EXTREMITY INTERVENTION Right 12/02/2018   Procedure: LOWER EXTREMITY INTERVENTION;  Surgeon: Algernon Huxley, MD;  Location: [facility];  Service: Cardiovascular;  Laterality: Right;  . POLYPECTOMY  06/05/2021   Procedure: POLYPECTOMY;  Surgeon: Sharyn Creamer, MD;  Location: [facility];  Service: Gastroenterology;;    SOCIAL HISTORY:   Social History   Tobacco Use  . Smoking status: Every Day    Packs/day: 0.50    Years: 40.00    Pack years: 20.00    Types: Cigarettes  . Smokeless tobacco: Never  Substance Use Topics  . Alcohol use: Not Currently    Alcohol/week: 21.0 standard drinks    Types: 21 Cans of beer per week    Comment: last drink 6 months ago    FAMILY HISTORY:   Family History  Problem Relation Age of Onset  . Diabetes Mother   . Kidney failure Mother   . Healthy Father   . Kidney failure Brother   . Healthy Sister   . Kidney disease Daughter   . Post-traumatic stress disorder Neg Hx   . Bladder Cancer Neg Hx   . Kidney cancer Neg Hx     DRUG ALLERGIES:   Allergies  Allergen Reactions  . Lisinopril Swelling    REVIEW OF SYSTEMS:   ROS As per history of present illness. All pertinent systems were reviewed above. Constitutional, HEENT, cardiovascular, respiratory, GI, GU, musculoskeletal, neuro, psychiatric, endocrine, integumentary and hematologic systems were reviewed  and are otherwise negative/unremarkable except for positive findings mentioned above in the HPI.   MEDICATIONS AT HOME:   Prior to Admission medications   Medication Sig Start Date End Date Taking? Authorizing Provider  albuterol (VENTOLIN HFA) 108 (90 Base) MCG/ACT inhaler Inhale 2 puffs into the lungs every 4 (four) hours as needed for wheezing or shortness of breath. 06/05/21   Danford, Suann Larry, MD  calcium acetate (PHOSLO) 667 MG capsule Take 2 capsules (1,334 mg total) by mouth 3 (three) times daily with meals. 06/05/21   Danford, Suann Larry, MD  cinacalcet (SENSIPAR) 30 MG tablet Take 1 tablet (30 mg total) by mouth daily with breakfast. 06/05/21   Danford, Suann Larry, MD  docusate sodium (COLACE) 100 MG capsule Take 1 capsule (100 mg total) by mouth 2 (two) times daily. 06/05/21   Edwin Dada, MD  epoetin alfa (EPOGEN) 4000 UNIT/ML injection Inject 1 mL (4,000 Units total) into the vein every Monday, Wednesday, and Friday with hemodialysis. 06/22/20   Enzo Bi, MD  ferrous sulfate 325 (65 FE)  MG tablet Take 1 tablet (325 mg total) by mouth 2 (two) times daily with a meal. 06/05/21   Danford, Suann Larry, MD  hydrALAZINE (APRESOLINE) 50 MG tablet Take 1 tablet (50 mg total) by mouth 3 (three) times daily. 06/05/21   Danford, Suann Larry, MD  pantoprazole (PROTONIX) 20 MG tablet Take 1 tablet (20 mg total) by mouth 2 (two) times daily before a meal. 06/05/21 08/04/21  Danford, Suann Larry, MD      VITAL SIGNS:  Blood pressure 118/81, pulse 100, temperature 98 F (36.7 C), temperature source Oral, resp. rate 12, height '5\' 4"'$  (1.626 m), weight 72.6 kg, SpO2 90 %.  PHYSICAL EXAMINATION:  Physical Exam  GENERAL:  55 y.o.-year-old African-American male patient lying in the bed with mild respiratory distress with conversational dyspnea.   EYES: Pupils equal, round, reactive to light and accommodation. No scleral icterus. Extraocular muscles intact.  HEENT: Head  atraumatic, normocephalic. Oropharynx and nasopharynx clear.  NECK:  Supple, no jugular venous distention. No thyroid enlargement, no tenderness.  LUNGS: Diminished bibasilar breath sounds with bibasal rales. CARDIOVASCULAR: Irregularly irregular rhythm, S1, S2 normal. No murmurs, rubs, or gallops.  ABDOMEN: Soft, mildly distended with positive shifting dullness, nontender. Bowel sounds present. No organomegaly or mass.  EXTREMITIES: 2+ bilateral lower extremity pitting edema with no cyanosis, or clubbing.  NEUROLOGIC: Cranial nerves II through XII are intact. Muscle strength 5/5 in all extremities. Sensation intact. Gait not checked.  PSYCHIATRIC: The patient is alert and oriented x 3.  Normal affect and good eye contact. SKIN: No obvious rash, lesion, or ulcer.   LABORATORY PANEL:   CBC Recent Labs  Lab 06/26/21 0804  WBC 9.6  HGB 7.1*  HCT 23.0*  PLT 150   ------------------------------------------------------------------------------------------------------------------  Chemistries  Recent Labs  Lab 06/26/21 0804 06/26/21 1725  NA 145 139  K 6.1* 4.0  CL 102 97*  CO2 20* 26  GLUCOSE 95 93  BUN 107* 72*  CREATININE 18.65* 11.47*  CALCIUM 9.4 9.2  MG 2.5*  --   AST 20  --   ALT 14  --   ALKPHOS 102  --   BILITOT 1.6*  --    ------------------------------------------------------------------------------------------------------------------  Cardiac Enzymes No results for input(s): TROPONINI in the last 168 hours. ------------------------------------------------------------------------------------------------------------------  RADIOLOGY:  DG Chest Portable 1 View  Result Date: 06/26/2021 CLINICAL DATA:  Short of breath EXAM: PORTABLE CHEST 1 VIEW COMPARISON:  None. FINDINGS: Stable enlarged cardiac silhouette. There is diffuse mild airspace disease. No focal consolidation. Pleural fluid or pneumothorax. IMPRESSION: Cardiomegaly and pulmonary edema pattern. Potential  component of pericardial effusion. Electronically Signed   By: Suzy Bouchard M.D.   On: 06/26/2021 08:35      IMPRESSION AND PLAN:  Active Problems:   Atrial fibrillation with RVR (HCC)  1.  Atrial fibrillation with rapid ventricular response. - The patient will be admitted to a progressive unit bed. - We will continue him on IV Cardizem drip. - His last 2D echo revealed EF of 45 to 50% with pericardial effusion up to 1.5 cm and moderate severe mitral valve regurgitation, moderate tricuspid regurgitation and moderate left atrial dilatation and mild right atrial dilatation. - Cardiology consult to be obtained. - I notified Dr. Corky Sox about the patient.  2.  Acute on chronic diastolic CHF with fluid overload with end-stage renal disease on hemodialysis, secondary to noncompliance with hemodialysis. - The patient received hemodialysis today and will likely require another session tomorrow as he missed about a week. -  Nephrology consult be obtained. - I notified Dr. Juleen China about the patient. - We will diurese with IV Lasix for now. - Cardiology consult to be obtained as mentioned above. - We will continue Sensipar and PhosLo.  3.  Essential hypertension. - We will continue hydralazine.  4.  GERD. - We will continue PPI therapy.  DVT prophylaxis: Subcutaneous heparin.   Code Status: The patient is DNR/DNI. Family Communication:  The plan of care was discussed in details with the patient (and family). I answered all questions. The patient agreed to proceed with the above mentioned plan. Further management will depend upon hospital course. Disposition Plan: Back to previous home environment Consults called: Cardiology and nephrology.  All the records are reviewed and case discussed with ED provider.  Status is: Inpatient  Remains inpatient appropriate because:Ongoing diagnostic testing needed not appropriate for outpatient work up, Unsafe d/c plan, IV treatments appropriate due to  intensity of illness or inability to take PO, and Inpatient level of care appropriate due to severity of illness  Dispo: The patient is from: Home              Anticipated d/c is to: Home              Patient currently is not medically stable to d/c.   Difficult to place patient No    TOTAL TIME TAKING CARE OF THIS PATIENT: 55 minutes.    Christel Mormon M.D on 06/26/2021 at 7:47 PM  [facility]   From 7 PM-7 AM, contact night-coverage [email or web address]  CC: Primary care physician; Pcp, No

## 2021-06-26 NOTE — ED Notes (Signed)
Changed pulse ox site from ear to nose.   Pleth worse on nose reading. Moved back to ear. SPO@ reading 85-89% on 6L. Changed to NRB at 10L, then 15L.

## 2021-06-26 NOTE — ED Notes (Signed)
Diltiazem will be administered once verified by pharmacy.

## 2021-06-26 NOTE — ED Notes (Signed)
Pt is not back from dialysis yet.

## 2021-06-26 NOTE — ED Notes (Signed)
Repeat EKG obtained. Dr Tamala Julian was just at bedside evaluating pt. See new orders.

## 2021-06-26 NOTE — Progress Notes (Signed)
Tx initiated, no complications cannulating. Patient stated he was unable to stand for a preweight, due to being in a stretcher a bed weight is unable to be taken. Will attempt to pull 0.5KG in treatment. Patient HR is elevated MD and RN Lucielle aware of the elevated HR. Will monitor the patient closely throughout the treatment.

## 2021-06-26 NOTE — ED Notes (Signed)
Dialysis nurse called. Pt is on way back from dialysis. 568m fluid was removed.

## 2021-06-26 NOTE — ED Notes (Signed)
RT at bedside.

## 2021-06-26 NOTE — ED Notes (Signed)
Pt up to toilet for BM. 

## 2021-06-26 NOTE — ED Provider Notes (Signed)
I sent care of this patient approximate 1500.  Patient is currently off the floor being dialyzed with plan to reassess on return.  He had presented after missing several dialysis sessions with shortness of breath and some chest tightness with concerns for acute volume overload.  On return back emergency room patient states he is feeling the same and is found to be tachycardic.  ECG shows A. fib with RVR.  He was placed on dill drip with appropriate rate control.  Repeat BMP shows improving BUN and creatinine still not exactly where expected to be.  ABG obtained shows no evidence of hypercarbic or hypoxic respiratory failure on chronic 2 L.  I will admit to medicine service for further evaluation management.  .Critical Care Performed by: Lucrezia Starch, MD Authorized by: Lucrezia Starch, MD   Critical care provider statement:    Critical care time (minutes):  45   Critical care was necessary to treat or prevent imminent or life-threatening deterioration of the following conditions:  Circulatory failure   Critical care was time spent personally by me on the following activities:  Discussions with consultants, evaluation of patient's response to treatment, examination of patient, ordering and performing treatments and interventions, ordering and review of laboratory studies, ordering and review of radiographic studies, pulse oximetry, re-evaluation of patient's condition, obtaining history from patient or surrogate and review of old charts    Lucrezia Starch, MD 06/26/21 1918

## 2021-06-26 NOTE — ED Notes (Signed)
RT on way to draw ABG.  SPO2 86% on 15L NRB.

## 2021-06-26 NOTE — ED Notes (Signed)
RT repositioned SPO2 sensor to obtain better signal. Pt has been titrated down to 2L [place]. Pt is not hypoxic but is still tachycardic.

## 2021-06-26 NOTE — ED Notes (Signed)
Provided graham crackers and applesauce and warm blankets. Pt sitting at side of bed. Pt has diet order. Was not called by lab re: potassium of 6.1. Informed ED provider face to face of this result.

## 2021-06-26 NOTE — ED Notes (Signed)
Pt. back from dialysis

## 2021-06-26 NOTE — Progress Notes (Signed)
Tx completed without incident, Patient remains to refuse a standing weight and bed weight is not available. No prolonged bleeding. Pulled 0.5KG of fluid. Patient HR remained elevated during treatment, MD is aware.

## 2021-06-26 NOTE — Progress Notes (Signed)
[facility]  ROUNDING NOTE   Subjective:   [name] was admitted to [facility] on 06/26/2021 for sob  Last hemodialysis treatment as an outpatient was 06/07/21.   Patient was in jail and did get dialysis last on 9/23.   Now with pulmonary edema, shortness of breath, increasing peripheral edema and hyperkalemia.    Objective:  Vital signs in last 24 hours:  Temp:  [97.5 F (36.4 C)] 97.5 F (36.4 C) (10/05 0758) Pulse Rate:  [91-107] 107 (10/05 0930) Resp:  [22-25] 22 (10/05 0930) BP: (122-153)/(86-108) 122/106 (10/05 0930) SpO2:  [92 %-100 %] 92 % (10/05 0930) Weight:  [72.6 kg] 72.6 kg (10/05 0759)  Weight change:  Filed Weights   06/26/21 0759  Weight: 72.6 kg    Intake/Output: No intake/output data recorded.   Intake/Output this shift:  No intake/output data recorded.  Physical Exam: General: NAD,   Head: Normocephalic, atraumatic. Moist oral mucosal membranes  Eyes: Anicteric, PERRL  Neck: Supple, trachea midline  Lungs:  Clear to auscultation  Heart: Regular rate and rhythm  Abdomen:  Soft, nontender,   Extremities:  ++ peripheral edema.  Neurologic: Nonfocal, moving all four extremities  Skin: No lesions  Access: Left AVF    Basic Metabolic Panel: Recent Labs  Lab 06/26/21 0804  NA 145  K 6.1*  CL 102  CO2 20*  GLUCOSE 95  BUN 107*  CREATININE 18.65*  CALCIUM 9.4  MG 2.5*  PHOS 9.6*    Liver Function Tests: Recent Labs  Lab 06/26/21 0804  AST 20  ALT 14  ALKPHOS 102  BILITOT 1.6*  PROT 8.8*  ALBUMIN 3.9   No results for input(s): LIPASE, AMYLASE in the last 168 hours. No results for input(s): AMMONIA in the last 168 hours.  CBC: Recent Labs  Lab 06/26/21 0804  WBC 9.6  NEUTROABS 7.6  HGB 7.1*  HCT 23.0*  MCV 101.8*  PLT 150    Cardiac Enzymes: No results for input(s): CKTOTAL, CKMB, CKMBINDEX, TROPONINI in the last 168 hours.  BNP: Invalid input(s): POCBNP  CBG: No results for input(s): GLUCAP  in the last 168 hours.  Microbiology: Results for orders placed or performed during the hospital encounter of 06/26/21  Resp Panel by RT-PCR (Flu A&B, Covid) Nasopharyngeal Swab     Status: None   Collection Time: 06/26/21  8:04 AM   Specimen: Nasopharyngeal Swab; Nasopharyngeal(NP) swabs in vial transport medium  Result Value Ref Range Status   SARS Coronavirus 2 by RT PCR NEGATIVE NEGATIVE Final    Comment: (NOTE) SARS-CoV-2 target nucleic acids are NOT DETECTED.  The SARS-CoV-2 RNA is generally detectable in upper respiratory specimens during the acute phase of infection. The lowest concentration of SARS-CoV-2 viral copies this assay can detect is 138 copies/mL. A negative result does not preclude SARS-Cov-2 infection and should not be used as the sole basis for treatment or other patient management decisions. A negative result may occur with  improper specimen collection/handling, submission of specimen other than nasopharyngeal swab, presence of viral mutation(s) within the areas targeted by this assay, and inadequate number of viral copies(<138 copies/mL). A negative result must be combined with clinical observations, patient history, and epidemiological information. The expected result is Negative.  Fact Sheet for Patients:  EntrepreneurPulse.com.au  Fact Sheet for Healthcare Providers:  IncredibleEmployment.be  This test is no t yet approved or cleared by the [place] FDA and  has been authorized for detection and/or diagnosis of SARS-CoV-2 by FDA under  an Emergency Use Authorization (EUA). This EUA will remain  in effect (meaning this test can be used) for the duration of the COVID-19 declaration under Section 564(b)(1) of the Act, 21 U.S.C.section 360bbb-3(b)(1), unless the authorization is terminated  or revoked sooner.       Influenza A by PCR NEGATIVE NEGATIVE Final   Influenza B by PCR NEGATIVE NEGATIVE Final    Comment:  (NOTE) The Xpert Xpress SARS-CoV-2/FLU/RSV plus assay is intended as an aid in the diagnosis of influenza from Nasopharyngeal swab specimens and should not be used as a sole basis for treatment. Nasal washings and aspirates are unacceptable for Xpert Xpress SARS-CoV-2/FLU/RSV testing.  Fact Sheet for Patients: EntrepreneurPulse.com.au  Fact Sheet for Healthcare Providers: IncredibleEmployment.be  This test is not yet approved or cleared by the [place] FDA and has been authorized for detection and/or diagnosis of SARS-CoV-2 by FDA under an Emergency Use Authorization (EUA). This EUA will remain in effect (meaning this test can be used) for the duration of the COVID-19 declaration under Section 564(b)(1) of the Act, 21 U.S.C. section 360bbb-3(b)(1), unless the authorization is terminated or revoked.  Performed at [facility], [address] [place], [place] [address]     Coagulation Studies: No results for input(s): LABPROT, INR in the last 72 hours.  Urinalysis: No results for input(s): COLORURINE, LABSPEC, PHURINE, GLUCOSEU, HGBUR, BILIRUBINUR, KETONESUR, PROTEINUR, UROBILINOGEN, NITRITE, LEUKOCYTESUR in the last 72 hours.  Invalid input(s): APPERANCEUR    Imaging: DG Chest Portable 1 View  Result Date: 06/26/2021 CLINICAL DATA:  Short of breath EXAM: PORTABLE CHEST 1 VIEW COMPARISON:  None. FINDINGS: Stable enlarged cardiac silhouette. There is diffuse mild airspace disease. No focal consolidation. Pleural fluid or pneumothorax. IMPRESSION: Cardiomegaly and pulmonary edema pattern. Potential component of pericardial effusion. Electronically Signed   By: Suzy Bouchard M.D.   On: 06/26/2021 08:35     Medications:     Chlorhexidine Gluconate Cloth  6 each Topical Q0600     Assessment/ Plan:  [name] is a 55 y.o. black male with end stage renal disease on hemodialysis, hypertension, COPD, EtOH abuse,  atrial fibrillation, congestive heart failure who is admitted to [facility] on [facility] M[facility] [place]. Left AVF 67kg  End Stage Renal Disease: hyperkalemia, pulmonary edema and volume overload. Will need urgent dialysis today. Orders prepared. 2K bath  Hypertension: 122/106. Volume driven. May restart home hydralazine after dialysis.   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 Recommend restarting calcium acetate and cinacalcet  Anemia with chronic kidney disease: hemoglobin 7.1. Macrocytic. Not getting outpatient ESA.    LOS: 0 Sojourner Behringer 10/5/202211:21 AM

## 2021-06-26 NOTE — ED Notes (Signed)
Spoke with dialysis nurse, Posey Pronto. Pt will be taken to dialysis shortly.

## 2021-06-26 NOTE — ED Notes (Signed)
Pt was up to toilet again for BM. Pt now in bed, resting on side. Obtaining VS.

## 2021-06-26 NOTE — ED Provider Notes (Signed)
[facility] Emergency Department Provider Note ____________________________________________   Event Date/Time   First MD Initiated Contact with Patient 06/26/21 [phone]     (approximate)  I have reviewed the triage vital signs and the nursing notes.   HISTORY  Chief Complaint Chest Pain and Shortness of Breath    HPI Jordan [name] is a 55 y.o. male with PMH as noted below including ESRD on dialysis who presents with shortness of breath, gradual onset over the last several days, associated with chest pain described as tightness, as well as a productive cough.  He also has some abdominal pain and distention over the same time period.  Per EMS, the patient missed several dialysis sessions.  The patient states that he last went either Monday (2 days ago) or last Saturday.  He denies any fever, vomiting, or diarrhea.   Past Medical History:  Diagnosis Date   Anemia    Aortic atherosclerosis ([place]) 11/12/2019   ED (erectile dysfunction)    Emphysema of lung ([place]) 11/12/2019   ESRD on hemodialysis ([place])    M/W/F in [place], [place]   ETOH abuse    History of blood transfusion    Hypertension    Wears dentures     Patient Active Problem List   Diagnosis Date Noted   Protein-calorie malnutrition, severe 06/05/2021   Hypervolemia associated with renal insufficiency 06/02/2021   Symptomatic anemia 05/28/2021   Acute on chronic diastolic CHF (congestive heart failure) ([place]) 05/28/2021   Hyperkalemia 05/28/2021   Pulmonary edema 04/29/2021   Pruritus 04/29/2021   COVID-19 virus infection 04/29/2021   Thrombocytopenia ([place]) 04/08/2021   COPD (chronic obstructive pulmonary disease) ([place]) 04/08/2021   CHF (congestive heart failure) ([place]) 03/25/2021   PAF (paroxysmal atrial fibrillation) ([place]) 03/20/2021   Chronic diastolic CHF (congestive heart failure) ([place]) 03/20/2021   Alcohol abuse 03/20/2021   Vision loss of left eye 03/20/2021   SOB (shortness of  breath) 03/20/2021   Shortness of breath 03/10/2021   Abscess of buttock 03/02/2021   Fluid overload 03/02/2021   Multifocal pneumonia 01/13/2021   Current mild episode of major depressive disorder without prior episode ([place]) 04/09/2020   Atrial fibrillation with RVR ([place]) 04/04/2020   Non-compliance with renal dialysis ([place]) 03/21/2020   Acute CHF (congestive heart failure) ([place]) 03/12/2020   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 ([place]) 02/03/2020   Acute on chronic combined systolic and diastolic CHF (congestive heart failure) ([place]) 02/03/2020   Acute respiratory failure with hypoxia ([place]) 11/12/2019   Aortic atherosclerosis ([place]) 11/12/2019   Emphysema of lung ([place]) 11/12/2019   Volume overload 10/05/2019   Elevated troponin 05/17/2018   Anemia in ESRD (end-stage renal disease) ([place]) 05/17/2018   ESRD on dialysis ([place]) 05/26/2017   Essential hypertension 05/26/2017   Moderate protein-calorie malnutrition ([place]) 08/13/2016   Secondary hyperparathyroidism of renal origin ([place]) 08/13/2016   Tobacco abuse 08/04/2016    Past Surgical History:  Procedure Laterality Date   BASCILIC VEIN TRANSPOSITION Left 08/07/2016   Procedure: LEFT BASILIC VEIN TRANSPOSITION;  Surgeon: Angelia Mould, MD;  Location: [facility];  Service: Vascular;  Laterality: Left;   CLOSED REDUCTION NASAL FRACTURE N/A 08/11/2019   Procedure: CLOSED REDUCTION NASAL FRACTURE WITH STABILIZATION;  Surgeon: Irene Limbo, MD;  Location: [facility];  Service: Plastics;  Laterality: N/A;   COLONOSCOPY N/A 08/12/2016   Procedure: COLONOSCOPY;  Surgeon: Otis Brace, MD;  Location: [facility];  Service: Gastroenterology;  Laterality: N/A;   COLONOSCOPY WITH PROPOFOL N/A  06/05/2021   Procedure: COLONOSCOPY WITH PROPOFOL;  Surgeon: Sharyn Creamer, MD;  Location: [place] [place];  Service: Gastroenterology;  Laterality: N/A;   ESOPHAGOGASTRODUODENOSCOPY N/A 08/12/2016   Procedure: ESOPHAGOGASTRODUODENOSCOPY  (EGD);  Surgeon: Otis Brace, MD;  Location: [place];  Service: Gastroenterology;  Laterality: N/A;   ESOPHAGOGASTRODUODENOSCOPY (EGD) WITH PROPOFOL N/A 06/05/2021   Procedure: ESOPHAGOGASTRODUODENOSCOPY (EGD) WITH PROPOFOL;  Surgeon: Sharyn Creamer, MD;  Location: [place];  Service: Gastroenterology;  Laterality: N/A;   INSERTION OF DIALYSIS CATHETER N/A 08/07/2016   Procedure: INSERTION OF TUNNELED DIALYSIS CATHETER;  Surgeon: Angelia Mould, MD;  Location: [place] [facility];  Service: Vascular;  Laterality: N/A;   LIGATION OF ARTERIOVENOUS  FISTULA Left 09/12/2016   Procedure: BANDING OF LEFT  ARTERIOVENOUS  FISTULA;  Surgeon: Angelia Mould, MD;  Location: [facility];  Service: Vascular;  Laterality: Left;   LOWER EXTREMITY INTERVENTION Right 12/02/2018   Procedure: LOWER EXTREMITY INTERVENTION;  Surgeon: Algernon Huxley, MD;  Location: [facility];  Service: Cardiovascular;  Laterality: Right;   POLYPECTOMY  06/05/2021   Procedure: POLYPECTOMY;  Surgeon: Sharyn Creamer, MD;  Location: [facility] ENDOSCOPY;  Service: Gastroenterology;;    Prior to Admission medications   Medication Sig Start Date End Date Taking? Authorizing Provider  albuterol (VENTOLIN HFA) 108 (90 Base) MCG/ACT inhaler Inhale 2 puffs into the lungs every 4 (four) hours as needed for wheezing or shortness of breath. 06/05/21   [place], Suann Larry, MD  calcium acetate (PHOSLO) 667 MG capsule Take 2 capsules (1,334 mg total) by mouth 3 (three) times daily with meals. 06/05/21   [place], Suann Larry, MD  cinacalcet (SENSIPAR) 30 MG tablet Take 1 tablet (30 mg total) by mouth daily with breakfast. 06/05/21   [place], Suann Larry, MD  docusate sodium (COLACE) 100 MG capsule Take 1 capsule (100 mg total) by mouth 2 (two) times daily. 06/05/21   Edwin Dada, MD  epoetin alfa (EPOGEN) 4000 UNIT/ML injection Inject 1 mL (4,000 Units total) into the vein every Monday, Wednesday, and Friday with hemodialysis.  06/22/20   Enzo Bi, MD  ferrous sulfate 325 (65 FE) MG tablet Take 1 tablet (325 mg total) by mouth 2 (two) times daily with a meal. 06/05/21   [place], Suann Larry, MD  hydrALAZINE (APRESOLINE) 50 MG tablet Take 1 tablet (50 mg total) by mouth 3 (three) times daily. 06/05/21   [place], Suann Larry, MD  pantoprazole (PROTONIX) 20 MG tablet Take 1 tablet (20 mg total) by mouth 2 (two) times daily before a meal. 06/05/21 08/04/21  [place], Suann Larry, MD    Allergies Lisinopril  Family History  Problem Relation Age of Onset   Diabetes Mother    Kidney failure Mother    Healthy Father    Kidney failure Brother    Healthy Sister    Kidney disease Daughter    Post-traumatic stress disorder Neg Hx    Bladder Cancer Neg Hx    Kidney cancer Neg Hx     Social History Social History   Tobacco Use   Smoking status: Every Day    Packs/day: 0.50    Years: 40.00    Pack years: 20.00    Types: Cigarettes   Smokeless tobacco: Never  Vaping Use   Vaping Use: Never used  Substance Use Topics   Alcohol use: Not Currently    Alcohol/week: 21.0 standard drinks    Types: 21 Cans of beer per week    Comment: last drink 6 months ago  Drug use: No    Review of Systems  Constitutional: No fever/chills Eyes: No redness. ENT: No sore throat. Cardiovascular: Positive for chest pain. Respiratory: Positive for shortness of breath. Gastrointestinal: No vomiting. Genitourinary: Negative for dysuria.  Musculoskeletal: Negative for back pain. Skin: Negative for rash. Neurological: Negative for headache.  ____________________________________________   PHYSICAL EXAM:  VITAL SIGNS: ED Triage Vitals  Enc Vitals Group     BP      Pulse      Resp      Temp      Temp src      SpO2      Weight      Height      Head Circumference      Peak Flow      Pain Score      Pain Loc      Pain Edu?      Excl. in [place]?     Constitutional: Alert and oriented.  Chronically ill-appearing but  in no acute distress. Eyes: Conjunctivae are normal.  EOMI.  PERRLA. Head: Atraumatic. Nose: No congestion/rhinnorhea. Mouth/Throat: Mucous membranes are moist.   Neck: Normal range of motion.  Cardiovascular: Tachycardic, regular rhythm. Grossly normal heart sounds.  Good peripheral circulation. Respiratory: Somewhat increased respiratory effort.  No retractions.  Diffuse rhonchi and some rales bilaterally. Gastrointestinal: Somewhat firm and distended.  Nontender. Genitourinary: No flank tenderness. Musculoskeletal: 1+ bilateral lower extremity edema.  Extremities warm and well perfused.  Neurologic:  Normal speech and language.  Motor intact in all extremities.  No gross focal neurologic deficits are appreciated.  Skin:  Skin is warm and dry. No rash noted. Psychiatric: Calm and cooperative.  ____________________________________________   LABS (all labs ordered are listed, but only abnormal results are displayed)  Labs Reviewed  COMPREHENSIVE METABOLIC PANEL - Abnormal; Notable for the following components:      Result Value   Potassium 6.1 (*)    CO2 20 (*)    BUN 107 (*)    Creatinine, Ser 18.65 (*)    Total Protein 8.8 (*)    Total Bilirubin 1.6 (*)    GFR, Estimated 3 (*)    Anion gap 23 (*)    All other components within normal limits  CBC WITH DIFFERENTIAL/PLATELET - Abnormal; Notable for the following components:   RBC 2.26 (*)    Hemoglobin 7.1 (*)    HCT 23.0 (*)    MCV 101.8 (*)    RDW 15.8 (*)    Lymphs Abs 0.6 (*)    Abs Immature Granulocytes 0.19 (*)    All other components within normal limits  BRAIN NATRIURETIC PEPTIDE - Abnormal; Notable for the following components:   B Natriuretic Peptide 4,471.7 (*)    All other components within normal limits  MAGNESIUM - Abnormal; Notable for the following components:   Magnesium 2.5 (*)    All other components within normal limits  PHOSPHORUS - Abnormal; Notable for the following components:   Phosphorus 9.6  (*)    All other components within normal limits  TROPONIN I (HIGH SENSITIVITY) - Abnormal; Notable for the following components:   Troponin I (High Sensitivity) 144 (*)    All other components within normal limits  TROPONIN I (HIGH SENSITIVITY) - Abnormal; Notable for the following components:   Troponin I (High Sensitivity) 140 (*)    All other components within normal limits  RESP PANEL BY RT-PCR (FLU A&B, COVID) [id number]  LACTIC ACID, PLASMA  HEPATITIS B SURFACE ANTIGEN  HEPATITIS B SURFACE ANTIBODY,QUALITATIVE  HEPATITIS B SURFACE ANTIBODY, QUANTITATIVE   ____________________________________________  EKG  ED ECG REPORT I, Arta Silence, the attending physician, personally viewed and interpreted this ECG.  Date: 06/26/2021 EKG Time: 0755 Rate: 108 Rhythm: normal sinus rhythm QRS Axis: normal Intervals: Prolonged QTc ST/T Wave abnormalities: normal Narrative Interpretation: no evidence of acute ischemia  ____________________________________________  RADIOLOGY  Chest x-ray interpreted by me shows cardiomegaly, pulmonary edema  ____________________________________________   PROCEDURES  Procedure(s) performed: No  Procedures  Critical Care performed: No ____________________________________________   INITIAL IMPRESSION / ASSESSMENT AND PLAN / ED COURSE  Pertinent labs & imaging results that were available during my care of the patient were reviewed by me and considered in my medical decision making (see chart for details).   55 year old male with PMH as noted above including ESRD on dialysis presents with shortness of breath, chest discomfort, and abdominal distention after apparently missing multiple dialysis sessions recently, although it is unclear from the patient's history exactly when his last dialysis session was.  I reviewed the past medical records in [place].  The patient was last seen in the ED on 9/23 with a similar presentation of shortness of breath  after missing dialysis, and was dialyzed and sent home from the ED.  He was admitted the week previously in September for dialysis again after having missed several sessions and signing out AMA from a session.  On exam currently, the patient is somewhat chronically ill-appearing but in no acute distress.  He is hypertensive and borderline tachycardic with otherwise normal vital signs.  O2 saturation is 100% on room air.  He does have some rales and rhonchi to bilateral lungs and the abdomen appears distended but is nontender, consistent with edema/ascites.  Overall presentation is consistent with fluid overload related to missed hemodialysis; differential includes acute CHF or other cardiac process, COPD exacerbation, pneumonia, COVID-19.  We will obtain a chest x-ray, lab work-up, and consult nephrology for likely dialysis.  ----------------------------------------- 10:49 AM on 06/26/2021 -----------------------------------------  Initial lab work-up is significant for a potassium of 6.1.  I have ordered calcium, insulin, and D50.  Troponin is elevated, but this is consistent with prior labs and has not increased on repeat.  There is no evidence of ACS.  Hemoglobin is 7.1, but the patient's baseline appears to be between 7 and 8.  He was transfused once recently when it was 6.  Magnesium and phosphorus are chronically elevated.  Chest x-ray shows pulmonary edema and concern for possible pericardial effusion; I reviewed the past records and the patient had an echocardiogram on 9/12 which showed a moderate pericardial effusion but no evidence of tamponade.  Clinically he continues to have no evidence of tamponade.  I consulted Dr. Juleen China from nephrology who advises that the patient can be dialyzed around midday today.  Based on the patient's clinical presentation and lab work-up, I do not see any specific reason that the patient would require admission once he is dialyzed.  At this time, the plan  will be to send the patient for dialysis and have him return to the ED for reassessment.  If he is stable and repeat labs are reassuring, he could likely go home.  ----------------------------------------- 3:17 PM on 06/26/2021 -----------------------------------------  The patient is currently in dialysis.  I have signed him out to the oncoming ED physician Dr. Tamala Julian for reassessment when he returns.  ____________________________________________   FINAL CLINICAL IMPRESSION(S) / ED DIAGNOSES  Final diagnoses:  End stage renal disease (  [place])      NEW MEDICATIONS STARTED DURING THIS VISIT:  New Prescriptions   No medications on file     Note:  This document was prepared using Dragon voice recognition software and may include unintentional dictation errors.    Arta Silence, MD 06/26/21 [phone]

## 2021-06-26 NOTE — ED Notes (Signed)
Pt up to toilet again for third BM.

## 2021-06-26 NOTE — ED Triage Notes (Signed)
Pt to ED for CP, SOB, abdominal distention  from home. Gets dialysis m w f  sob cp saw doctor last week who said to get dialysis. missed dialysis monday (today is [place]). Pt unsure when got dialysis last.  coughing up brown mucoous  L arm restricted  Pt has exertional SOB. Pt also has CHF. Face and whole body edematous  feels achiness in chest, worse with cough  EDP has seen pt.

## 2021-06-27 ENCOUNTER — Inpatient Hospital Stay: Payer: Medicare Other

## 2021-06-27 DIAGNOSIS — I4891 Unspecified atrial fibrillation: Secondary | ICD-10-CM | POA: Diagnosis not present

## 2021-06-27 LAB — MAGNESIUM: Magnesium: 2.2 mg/dL (ref 1.7–2.4)

## 2021-06-27 LAB — CBC
HCT: 23.1 % — ABNORMAL LOW (ref 39.0–52.0)
HCT: 24.1 % — ABNORMAL LOW (ref 39.0–52.0)
Hemoglobin: 7.3 g/dL — ABNORMAL LOW (ref 13.0–17.0)
Hemoglobin: 7.3 g/dL — ABNORMAL LOW (ref 13.0–17.0)
MCH: 32.4 pg (ref 26.0–34.0)
MCH: 30.4 pg (ref 26.0–34.0)
MCHC: 31.6 g/dL (ref 30.0–36.0)
MCHC: 30.3 g/dL (ref 30.0–36.0)
MCV: 102.7 fL — ABNORMAL HIGH (ref 80.0–100.0)
MCV: 100.4 fL — ABNORMAL HIGH (ref 80.0–100.0)
Platelets: 144 10*3/uL — ABNORMAL LOW (ref 150–400)
Platelets: 150 10*3/uL (ref 150–400)
RBC: 2.25 MIL/uL — ABNORMAL LOW (ref 4.22–5.81)
RBC: 2.4 MIL/uL — ABNORMAL LOW (ref 4.22–5.81)
RDW: 15.5 % (ref 11.5–15.5)
RDW: 15.3 % (ref 11.5–15.5)
WBC: 8.1 10*3/uL (ref 4.0–10.5)
WBC: 8.8 10*3/uL (ref 4.0–10.5)
nRBC: 0 % (ref 0.0–0.2)
nRBC: 0 % (ref 0.0–0.2)

## 2021-06-27 LAB — BASIC METABOLIC PANEL
Anion gap: 20 — ABNORMAL HIGH (ref 5–15)
Anion gap: 20 — ABNORMAL HIGH (ref 5–15)
BUN: 81 mg/dL — ABNORMAL HIGH (ref 6–20)
BUN: 88 mg/dL — ABNORMAL HIGH (ref 6–20)
CO2: 24 mmol/L (ref 22–32)
CO2: 23 mmol/L (ref 22–32)
Calcium: 8.9 mg/dL (ref 8.9–10.3)
Calcium: 8.6 mg/dL — ABNORMAL LOW (ref 8.9–10.3)
Chloride: 99 mmol/L (ref 98–111)
Chloride: 97 mmol/L — ABNORMAL LOW (ref 98–111)
Creatinine, Ser: 12.4 mg/dL — ABNORMAL HIGH (ref 0.61–1.24)
Creatinine, Ser: 13.08 mg/dL — ABNORMAL HIGH (ref 0.61–1.24)
GFR, Estimated: 4 mL/min — ABNORMAL LOW (ref >60)
GFR, Estimated: 4 mL/min — ABNORMAL LOW (ref >60)
Glucose, Bld: 116 mg/dL — ABNORMAL HIGH (ref 70–99)
Glucose, Bld: 122 mg/dL — ABNORMAL HIGH (ref 70–99)
Potassium: 4.3 mmol/L (ref 3.5–5.1)
Potassium: 5.1 mmol/L (ref 3.5–5.1)
Sodium: 143 mmol/L (ref 135–145)
Sodium: 140 mmol/L (ref 135–145)

## 2021-06-27 LAB — GLUCOSE, CAPILLARY: Glucose-Capillary: 121 mg/dL — ABNORMAL HIGH (ref 70–99)

## 2021-06-27 IMAGING — DX DG CHEST 1V
1 series · 1 of 1 positions shown · non-contrast
Comparison: [DATE]

CLINICAL DATA: Anaphylactic reaction

EXAM:
CHEST  1 VIEW

[chest ap]
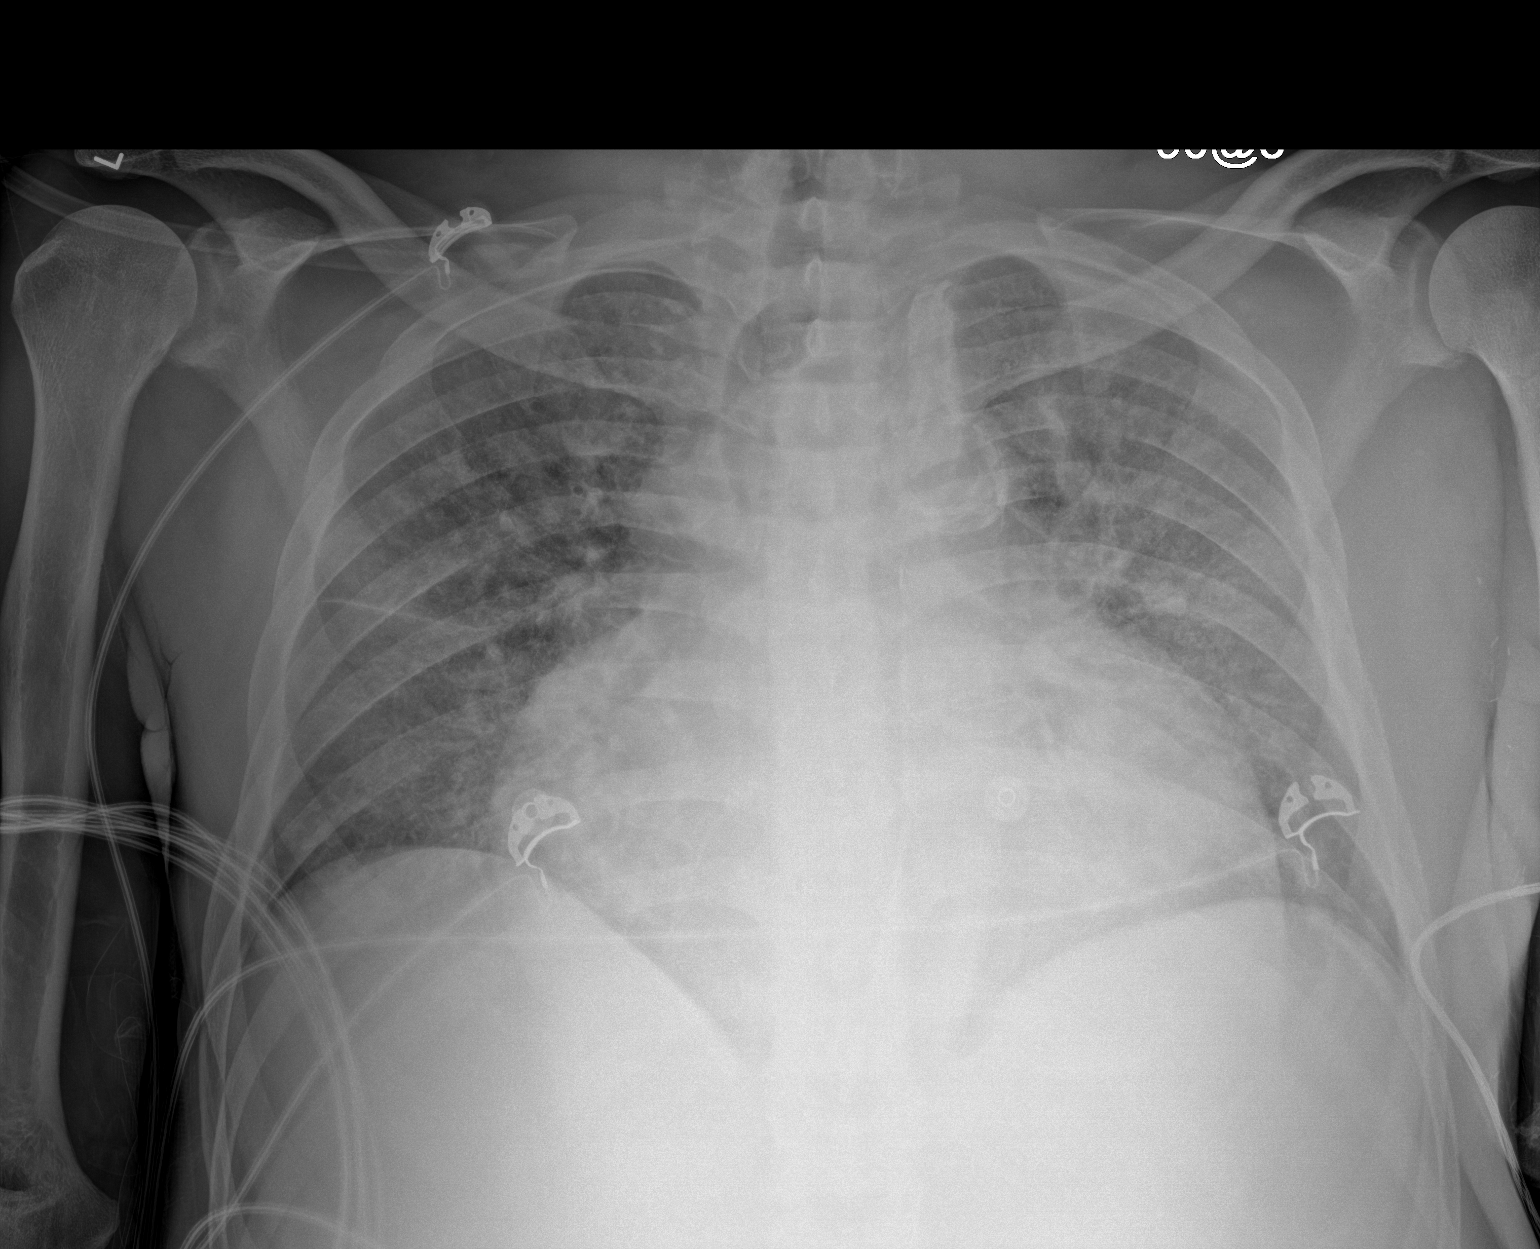

[1 of 1 positions shown; findings below may reference images not displayed]

FINDINGS: Cardiac size is moderately enlarged, similar to prior examination
and similar to remote prior examination of [DATE]. A component
of this was related to an underlying pericardial effusion better
appreciated on CT examination of [DATE]. Superimposed mild to
moderate diffuse interstitial pulmonary edema has progressed
slightly in the interval since prior examination. No pneumothorax or
pleural effusion. Pulmonary insufflation is stable, the lung volumes
are small. No acute bone abnormality.
IMPRESSION: Stable cardiomegaly, a component which likely relates to an
underlying pericardial effusion as better seen on CT examination of
[DATE].

Progressive mild to moderate pulmonary edema.

Pulmonary hypoinflation.

## 2021-06-27 MED ORDER — METHYLPREDNISOLONE SODIUM SUCC 125 MG IJ SOLR
INTRAMUSCULAR | Status: AC
  Administered 2021-06-27: 125 mg
  Filled 2021-06-27: qty 2

## 2021-06-27 MED ORDER — FAMOTIDINE IN NACL 20-0.9 MG/50ML-% IV SOLN
20.0000 mg | Freq: Once | INTRAVENOUS | Status: AC
  Administered 2021-06-27: 20 mg via INTRAVENOUS

## 2021-06-27 MED ORDER — EPINEPHRINE 0.3 MG/0.3ML IJ SOAJ
0.3000 mg | Freq: Once | INTRAMUSCULAR | Status: AC
  Administered 2021-06-27: 0.3 mg via INTRAMUSCULAR
  Filled 2021-06-27: qty 0.3

## 2021-06-27 MED ORDER — DIPHENHYDRAMINE HCL 50 MG/ML IJ SOLN
INTRAMUSCULAR | Status: AC
  Administered 2021-06-27: 50 mg
  Filled 2021-06-27: qty 1

## 2021-06-27 MED ORDER — RACEPINEPHRINE HCL 2.25 % IN NEBU
INHALATION_SOLUTION | RESPIRATORY_TRACT | Status: AC
  Administered 2021-06-27: 0.5 mL
  Filled 2021-06-27: qty 0.5

## 2021-06-27 NOTE — Progress Notes (Signed)
PROGRESS NOTE    Jordan [name]  [id number] DOB: [birth year] DOA: 06/26/2021 PCP: Pcp, No    Brief Narrative:  55 y.o. African-American male with medical history significant for end-stage renal disease on hemodialysis Monday Wednesday and Fridays, hypertension, emphysema with noncompliance to hemodialysis, who presented to the emergency room with acute onset of worsening dyspnea with associated lower extremity edema, orthopnea and paroxysmal nocturnal dyspnea as well as dyspnea on exertion.  The patient has not been getting hemodialysis for about a week.  He stated that he was in jail and was just released a couple of days ago.  He was taken to hemodialysis earlier this morning and after coming back to the ER started having chest pain and palpitations and was noted to be in atrial fibrillation with RVR.  He denies any nausea or vomiting or diaphoresis.  No headache or dizziness or blurred vision.  No diarrhea or constipation.  No melena or bright red bleeding per rectum.  No other bleeding diathesis.   Assessment & Plan:   Active Problems:   Atrial fibrillation with RVR (HCC)  Atrial fibrillation with rapid ventricular response New onset On Cardizem gtt. Plan: Cardizem gtt., wean as tolerated Patient remained stable on 5 to 10 mg/h IV Cardizem will transition to p.o. Cardiology consulted, [facility]  Acute on chronic diastolic congestive heart failure Fluid overload secondary to missed hemodialysis Patient had HD on 10/5 on admission Plan: Nephrology following for inpatient HD needs Continue Sensipar and PhosLo  Essential hypertension Hydralazine  GERD PPI    DVT prophylaxis: SQ heparin Code Status: DNR Family Communication: None today Disposition Plan: Status is: Inpatient  Remains inpatient appropriate because:Inpatient level of care appropriate due to severity of illness  Dispo: The patient is from: Home              Anticipated d/c is to: Home               Patient currently is medically stable to d/c.   Difficult to place patient No       Level of care: Progressive Cardiac  Consultants:  Cardiology  Procedures:  None  Antimicrobials: None   Subjective: Seen and examined.  Very upset about continued hospital stay.  I explained the reasons for continued hospitalization or what we are actively managing.  Objective: Vitals:   06/27/21 1300 06/27/21 1315 06/27/21 1330 06/27/21 1345  BP: 100/68  101/61   Pulse: 62 (!) 59    Resp: (!) 9 (!) '23 16 17  '$ Temp:      TempSrc:      SpO2: 97% 98%    Weight:      Height:        Intake/Output Summary (Last 24 hours) at 06/27/2021 1438 Last data filed at 06/26/2021 1647 Gross per 24 hour  Intake --  Output 500 ml  Net -500 ml   Filed Weights   06/26/21 0759  Weight: 72.6 kg    Examination:  General exam: Agitated Respiratory system: Diffuse rhonchi bilaterally.  Normal work of breathing.  2 L Cardiovascular system: Tachycardic, regular rhythm, no murmurs, no pedal edema Gastrointestinal system: Obese, nontender, nondistended, normal bowel sounds Central nervous system: Alert and oriented. No focal neurological deficits. Extremities: Symmetric 5 x 5 power. Skin: No rashes, lesions or ulcers Psychiatry: Judgement and insight appear impaired. Mood & affect agitated.     Data Reviewed: I have personally reviewed following labs and imaging studies  CBC: Recent Labs  Lab 06/26/21 0804  06/27/21 0607  WBC 9.6 8.1  NEUTROABS 7.6  --   HGB 7.1* 7.3*  HCT 23.0* 23.1*  MCV 101.8* 102.7*  PLT 150 [id number]*   Basic Metabolic Panel: Recent Labs  Lab 06/26/21 0804 06/26/21 1725 06/27/21 0607  NA 145 139 143  K 6.1* 4.0 4.3  CL 102 97* 99  CO2 20* 26 24  GLUCOSE 95 93 116*  BUN 107* 72* 81*  CREATININE 18.65* 11.47* 12.40*  CALCIUM 9.4 9.2 8.9  MG 2.5*  --   --   PHOS 9.6*  --   --    GFR: Estimated Creatinine Clearance: 6.3 mL/min (A) (by C-G formula based on SCr of  12.4 mg/dL (H)). Liver Function Tests: Recent Labs  Lab 06/26/21 0804  AST 20  ALT 14  ALKPHOS 102  BILITOT 1.6*  PROT 8.8*  ALBUMIN 3.9   No results for input(s): LIPASE, AMYLASE in the last 168 hours. No results for input(s): AMMONIA in the last 168 hours. Coagulation Profile: No results for input(s): INR, PROTIME in the last 168 hours. Cardiac Enzymes: No results for input(s): CKTOTAL, CKMB, CKMBINDEX, TROPONINI in the last 168 hours. BNP (last 3 results) No results for input(s): PROBNP in the last 8760 hours. HbA1C: No results for input(s): HGBA1C in the last 72 hours. CBG: No results for input(s): GLUCAP in the last 168 hours. Lipid Profile: No results for input(s): CHOL, HDL, LDLCALC, TRIG, CHOLHDL, LDLDIRECT in the last 72 hours. Thyroid Function Tests: No results for input(s): TSH, T4TOTAL, FREET4, T3FREE, THYROIDAB in the last 72 hours. Anemia Panel: No results for input(s): VITAMINB12, FOLATE, FERRITIN, TIBC, IRON, RETICCTPCT in the last 72 hours. Sepsis Labs: Recent Labs  Lab 06/26/21 0804  LATICACIDVEN 1.6    Recent Results (from the past 240 hour(s))  Resp Panel by RT-PCR (Flu A&B, Covid) Nasopharyngeal Swab     Status: None   Collection Time: 06/26/21  8:04 AM   Specimen: Nasopharyngeal Swab; Nasopharyngeal(NP) swabs in vial transport medium  Result Value Ref Range Status   SARS Coronavirus 2 by RT PCR NEGATIVE NEGATIVE Final    Comment: (NOTE) SARS-CoV-2 target nucleic acids are NOT DETECTED.  The SARS-CoV-2 RNA is generally detectable in upper respiratory specimens during the acute phase of infection. The lowest concentration of SARS-CoV-2 viral copies this assay can detect is 138 copies/mL. A negative result does not preclude SARS-Cov-2 infection and should not be used as the sole basis for treatment or other patient management decisions. A negative result may occur with  improper specimen collection/handling, submission of specimen other than  nasopharyngeal swab, presence of viral mutation(s) within the areas targeted by this assay, and inadequate number of viral copies(<138 copies/mL). A negative result must be combined with clinical observations, patient history, and epidemiological information. The expected result is Negative.  Fact Sheet for Patients:  EntrepreneurPulse.com.au  Fact Sheet for Healthcare Providers:  IncredibleEmployment.be  This test is no t yet approved or cleared by the [place] FDA and  has been authorized for detection and/or diagnosis of SARS-CoV-2 by FDA under an Emergency Use Authorization (EUA). This EUA will remain  in effect (meaning this test can be used) for the duration of the COVID-19 declaration under Section 564(b)(1) of the Act, 21 U.S.C.section 360bbb-3(b)(1), unless the authorization is terminated  or revoked sooner.       Influenza A by PCR NEGATIVE NEGATIVE Final   Influenza B by PCR NEGATIVE NEGATIVE Final    Comment: (NOTE) The Xpert Xpress SARS-CoV-2/FLU/RSV plus  assay is intended as an aid in the diagnosis of influenza from Nasopharyngeal swab specimens and should not be used as a sole basis for treatment. Nasal washings and aspirates are unacceptable for Xpert Xpress SARS-CoV-2/FLU/RSV testing.  Fact Sheet for Patients: EntrepreneurPulse.com.au  Fact Sheet for Healthcare Providers: IncredibleEmployment.be  This test is not yet approved or cleared by the [place] FDA and has been authorized for detection and/or diagnosis of SARS-CoV-2 by FDA under an Emergency Use Authorization (EUA). This EUA will remain in effect (meaning this test can be used) for the duration of the COVID-19 declaration under Section 564(b)(1) of the Act, 21 U.S.C. section 360bbb-3(b)(1), unless the authorization is terminated or revoked.  Performed at [facility], [address], [place], [place]  [address]          Radiology Studies: DG Chest Portable 1 View  Result Date: 06/26/2021 CLINICAL DATA:  Short of breath EXAM: PORTABLE CHEST 1 VIEW COMPARISON:  None. FINDINGS: Stable enlarged cardiac silhouette. There is diffuse mild airspace disease. No focal consolidation. Pleural fluid or pneumothorax. IMPRESSION: Cardiomegaly and pulmonary edema pattern. Potential component of pericardial effusion. Electronically Signed   By: Suzy Bouchard M.D.   On: 06/26/2021 08:35        Scheduled Meds:  calcium acetate  1,334 mg Oral TID WC   Chlorhexidine Gluconate Cloth  6 each Topical Q0600   cinacalcet  30 mg Oral Q breakfast   docusate sodium  100 mg Oral BID   ferrous sulfate  325 mg Oral BID WC   furosemide  40 mg Intravenous Q12H   heparin  5,000 Units Subcutaneous Q8H   hydrALAZINE  50 mg Oral TID   pantoprazole  20 mg Oral BID AC   Continuous Infusions:  diltiazem (CARDIZEM) infusion 10 mg/hr (06/27/21 1435)     LOS: 1 day    Time spent: 35 minutes    Sidney Ace, MD [facility]   If 7PM-7AM, please contact night-coverage  06/27/2021, 2:38 PM

## 2021-06-27 NOTE — Consult Note (Signed)
CARDIOLOGY CONSULT NOTE               Patient ID: Jordan [name] MRN: [id number] DOB/AGE: 10/16/1965 55 y.o.  Admit date: 06/26/2021 Referring Physician Richardean Chimera Primary Physician  Primary Cardiologist none Reason for Consultation atrial fibrillation rapid ventricular response  HPI: Patient is a 55 year old vascular male multiple medical problems including end-stage renal disease on dialysis emphysema hypertension shortness of breath dyspnea COPD also with some chest pain but was found to have tachycardia EKG suggested atrial fibrillation patient was brought in for further evaluation and care.  Patient also found to be hyperkalemic and chronic anemia.  Heart rate was controlled with IV Cardizem patient was hypertensive as well denies any significant previous cardiac history.  Chest pain is improved  Review of systems complete and found to be negative unless listed above     Past Medical History:  Diagnosis Date   Anemia    Aortic atherosclerosis ([place]) 11/12/2019   ED (erectile dysfunction)    Emphysema of lung ([place]) 11/12/2019   ESRD on hemodialysis ([place])    M/W/F in [place], [place]   ETOH abuse    History of blood transfusion    Hypertension    Wears dentures     Past Surgical History:  Procedure Laterality Date   BASCILIC VEIN TRANSPOSITION Left 08/07/2016   Procedure: LEFT BASILIC VEIN TRANSPOSITION;  Surgeon: Angelia Mould, MD;  Location: [facility];  Service: Vascular;  Laterality: Left;   CLOSED REDUCTION NASAL FRACTURE N/A 08/11/2019   Procedure: CLOSED REDUCTION NASAL FRACTURE WITH STABILIZATION;  Surgeon: Irene Limbo, MD;  Location: [facility];  Service: Plastics;  Laterality: N/A;   COLONOSCOPY N/A 08/12/2016   Procedure: COLONOSCOPY;  Surgeon: Otis Brace, MD;  Location: [facility];  Service: Gastroenterology;  Laterality: N/A;   COLONOSCOPY WITH PROPOFOL N/A 06/05/2021   Procedure: COLONOSCOPY WITH PROPOFOL;  Surgeon: Sharyn Creamer, MD;   Location: [facility];  Service: Gastroenterology;  Laterality: N/A;   ESOPHAGOGASTRODUODENOSCOPY N/A 08/12/2016   Procedure: ESOPHAGOGASTRODUODENOSCOPY (EGD);  Surgeon: Otis Brace, MD;  Location: [facility];  Service: Gastroenterology;  Laterality: N/A;   ESOPHAGOGASTRODUODENOSCOPY (EGD) WITH PROPOFOL N/A 06/05/2021   Procedure: ESOPHAGOGASTRODUODENOSCOPY (EGD) WITH PROPOFOL;  Surgeon: Sharyn Creamer, MD;  Location: [facility];  Service: Gastroenterology;  Laterality: N/A;   INSERTION OF DIALYSIS CATHETER N/A 08/07/2016   Procedure: INSERTION OF TUNNELED DIALYSIS CATHETER;  Surgeon: Angelia Mould, MD;  Location: [facility];  Service: Vascular;  Laterality: N/A;   LIGATION OF ARTERIOVENOUS  FISTULA Left 09/12/2016   Procedure: BANDING OF LEFT  ARTERIOVENOUS  FISTULA;  Surgeon: Angelia Mould, MD;  Location: [facility];  Service: Vascular;  Laterality: Left;   LOWER EXTREMITY INTERVENTION Right 12/02/2018   Procedure: LOWER EXTREMITY INTERVENTION;  Surgeon: Algernon Huxley, MD;  Location: [facility];  Service: Cardiovascular;  Laterality: Right;   POLYPECTOMY  06/05/2021   Procedure: POLYPECTOMY;  Surgeon: Sharyn Creamer, MD;  Location: [facility];  Service: Gastroenterology;;    (Not in a hospital admission)  Social History   Socioeconomic History   Marital status: Single    Spouse name: Not on file   Number of children: Not on file   Years of education: Not on file   Highest education level: Not on file  Occupational History   Occupation: disabled  Tobacco Use   Smoking status: Every Day    Packs/day: 0.50    Years: 40.00    Pack years: 20.00  Types: Cigarettes   Smokeless tobacco: Never  Vaping Use   Vaping Use: Never used  Substance and Sexual Activity   Alcohol use: Not Currently    Alcohol/week: 21.0 standard drinks    Types: 21 Cans of beer per week    Comment: last drink 6 months ago   Drug use: No   Sexual activity: Not Currently  Other Topics  Concern   Not on file  Social History Narrative   Not on file   Social Determinants of Health   Financial Resource Strain: Not on file  Food Insecurity: Not on file  Transportation Needs: Not on file  Physical Activity: Not on file  Stress: Not on file  Social Connections: Not on file  Intimate Partner Violence: Not on file    Family History  Problem Relation Age of Onset   Diabetes Mother    Kidney failure Mother    Healthy Father    Kidney failure Brother    Healthy Sister    Kidney disease Daughter    Post-traumatic stress disorder Neg Hx    Bladder Cancer Neg Hx    Kidney cancer Neg Hx       Review of systems complete and found to be negative unless listed above      PHYSICAL EXAM  General: Well developed, well nourished, in no acute distress HEENT:  Normocephalic and atramatic Neck:  No JVD.  Lungs: Clear bilaterally to auscultation and percussion. Heart: HRRR . Normal S1 and S2 without gallops or murmurs.  Abdomen: Bowel sounds are positive, abdomen soft and non-tender  Msk:  Back normal, normal gait. Normal strength and tone for age. Extremities: No clubbing, cyanosis or edema.   Neuro: Alert and oriented X 3. Psych:  Good affect, responds appropriately  Labs:   Lab Results  Component Value Date   WBC 8.1 06/27/2021   HGB 7.3 (L) 06/27/2021   HCT 23.1 (L) 06/27/2021   MCV 102.7 (H) 06/27/2021   PLT 144 (L) 06/27/2021    Recent Labs  Lab 06/26/21 0804 06/26/21 1725 06/27/21 0607  NA 145   < > 143  K 6.1*   < > 4.3  CL 102   < > 99  CO2 20*   < > 24  BUN 107*   < > 81*  CREATININE 18.65*   < > 12.40*  CALCIUM 9.4   < > 8.9  PROT 8.8*  --   --   BILITOT 1.6*  --   --   ALKPHOS 102  --   --   ALT 14  --   --   AST 20  --   --   GLUCOSE 95   < > 116*   < > = values in this interval not displayed.   Lab Results  Component Value Date   TROPONINI 0.08 [facility]) 02/18/2019    Lab Results  Component Value Date   CHOL 145 05/17/2018   Lab  Results  Component Value Date   HDL 34 (L) 05/17/2018   Lab Results  Component Value Date   LDLCALC 98 05/17/2018   Lab Results  Component Value Date   TRIG 105 04/29/2021   TRIG 67 05/17/2018   Lab Results  Component Value Date   CHOLHDL 4.3 05/17/2018   No results found for: LDLDIRECT    Radiology: DG Chest 2 View  Result Date: 06/14/2021 CLINICAL DATA:  Missed dialysis. EXAM: CHEST - 2 VIEW COMPARISON:  Chest x-ray dated June 02, 2021.  FINDINGS: Unchanged enlarged, globular cardiopericardial silhouette likely reflecting cardiomegaly and pericardial effusion. New diffuse interstitial opacity. No focal consolidation, pleural effusion, or pneumothorax. No acute osseous abnormality. IMPRESSION: 1. New diffuse interstitial pulmonary edema. Electronically Signed   By: Titus Dubin M.D.   On: 06/14/2021 14:59   DG Chest 2 View  Result Date: 05/28/2021 CLINICAL DATA:  Shortness of breath. EXAM: CHEST - 2 VIEW COMPARISON:  05/22/2021 FINDINGS: Stable cardiac enlargement. Diffuse increase interstitial markings throughout both lungs compatible with interstitial edema. No pleural effusion identified. No airspace consolidation. IMPRESSION: Cardiac enlargement with diffuse increase interstitial markings compatible with interstitial edema. Electronically Signed   By: Kerby Moors M.D.   On: 05/28/2021 19:00   DG Chest Portable 1 View  Result Date: 06/26/2021 CLINICAL DATA:  Short of breath EXAM: PORTABLE CHEST 1 VIEW COMPARISON:  None. FINDINGS: Stable enlarged cardiac silhouette. There is diffuse mild airspace disease. No focal consolidation. Pleural fluid or pneumothorax. IMPRESSION: Cardiomegaly and pulmonary edema pattern. Potential component of pericardial effusion. Electronically Signed   By: Suzy Bouchard M.D.   On: 06/26/2021 08:35   DG Chest Port 1 View  Result Date: 06/02/2021 CLINICAL DATA:  Shortness of breath. EXAM: PORTABLE CHEST 1 VIEW COMPARISON:  May 03, 2021,  chest CT and May 28, 2021, chest x-ray. FINDINGS: Markedly enlarged and globular cardiac silhouette. Mild pulmonary vascular congestion. No pleural effusion or pneumothorax seen. Osseous structures are without acute abnormality. Soft tissues are grossly normal. IMPRESSION: 1. Markedly enlarged and globular cardiac silhouette. The appearance is highly suspicious for large pericardial effusion. Please further evaluate with cardiac echo. Cardiology consultation is also recommended. 2. Mild pulmonary vascular congestion versus peribronchial airspace consolidation such as seen in atypical pneumonia. Electronically Signed   By: Fidela Salisbury M.D.   On: 06/02/2021 13:09   ECHOCARDIOGRAM LIMITED  Result Date: 06/03/2021    ECHOCARDIOGRAM LIMITED REPORT   Patient Name:   Jordan [name] Date of Exam: 06/03/2021 Medical Rec #:  [id number]        Height:       64.0 in Accession #:    [id number]       Weight:       171.3 lb Date of Birth:  [birth year]        BSA:          1.832 m Patient Age:    89 years         BP:           145/91 mmHg Patient Gender: M                HR:           99 bpm. Exam Location:  Inpatient Procedure: Limited Echo, Cardiac Doppler and Color Doppler STAT ECHO Indications:    I31.3 Pericardial effusion (noninflammatory)  History:        Patient has prior history of Echocardiogram examinations, most                 recent 06/02/2021. Signs/Symptoms:Chest Pain and Dyspnea; Risk                 Factors:Current Smoker. ESRD. Recent covid infection.                 Pericardial effusion. ETOH.  Sonographer:    [place] Referring Phys: [id number] AMRIT ADHIKARI IMPRESSIONS  1. Limited echo for pericardial effusion  2. Left ventricular ejection fraction, by estimation, is 45 to 50%. The left ventricle  has mildly decreased function. The left ventricle demonstrates global hypokinesis. There is the interventricular septum is flattened in systole and diastole, consistent with right ventricular  pressure and volume overload.  3. Pericardial effusion measures up to 1.5 cm. Moderate pericardial effusion. The pericardial effusion is circumferential. There is no evidence of cardiac tamponade.  4. The mitral valve is degenerative. Moderate to severe mitral valve regurgitation.  5. The tricuspid valve is degenerative. Tricuspid valve regurgitation is moderate.  6. The aortic valve is tricuspid.  7. Right ventricular systolic function is normal. The right ventricular size is normal. There is severely elevated pulmonary artery systolic pressure. The estimated right ventricular systolic pressure is [id number] mmHg.  8. The inferior vena cava is dilated in size with <50% respiratory variability, suggesting right atrial pressure of 15 mmHg.  9. Left atrial size was moderately dilated. 10. Right atrial size was mildly dilated. Comparison(s): Changes from prior study are noted. 06/03/2021: LVEF 40-45%, small pericardial effusion, no tamponade. The pericardial effusion appears larger today then by echo yesterday. Cannot exclude clinical tamponade given degree of pulmonary hypertension. Clinical correlation is advised. Conclusion(s)/Recommendation(s): Due to the above findings, cardiology consult is recommended. Critical findings reported to Dr. Veverly Fells and acknowledged at 2:23 pm. FINDINGS  Left Ventricle: Left ventricular ejection fraction, by estimation, is 45 to 50%. The left ventricle has mildly decreased function. The left ventricle demonstrates global hypokinesis. The interventricular septum is flattened in systole and diastole, consistent with right ventricular pressure and volume overload. Right Ventricle: The right ventricular size is normal. No increase in right ventricular wall thickness. Right ventricular systolic function is normal. There is severely elevated pulmonary artery systolic pressure. The tricuspid regurgitant velocity is 3.48 m/s, and with an assumed right atrial pressure of 15 mmHg, the estimated  right ventricular systolic pressure is [id number] mmHg. Left Atrium: Left atrial size was moderately dilated. Right Atrium: Right atrial size was mildly dilated. Pericardium: Pericardial effusion measures up to 1.5 cm. A moderately sized pericardial effusion is present. The pericardial effusion is circumferential. There is no evidence of cardiac tamponade. Mitral Valve: The mitral valve is degenerative in appearance. Moderate to severe mitral valve regurgitation, with wall-impinging jet. Tricuspid Valve: The tricuspid valve is degenerative in appearance. Tricuspid valve regurgitation is moderate. Aortic Valve: The aortic valve is tricuspid. Venous: The inferior vena cava is dilated in size with less than 50% respiratory variability, suggesting right atrial pressure of 15 mmHg. IAS/Shunts: No atrial level shunt detected by color flow Doppler.  LV Volumes (MOD) LV vol d, MOD A2C: 112.0 ml LV vol d, MOD A4C: 114.0 ml LV vol s, MOD A2C: 69.9 ml LV vol s, MOD A4C: 54.9 ml LV SV MOD A2C:     42.1 ml LV SV MOD A4C:     114.0 ml LV SV MOD BP:      50.1 ml IVC IVC diam: 2.80 cm TRICUSPID VALVE TR Peak grad:   48.4 mmHg TR Vmax:        348.00 cm/s Lyman Bishop MD Electronically signed by Lyman Bishop MD Signature Date/Time: 06/03/2021/2:32:48 PM    Final    ECHOCARDIOGRAM LIMITED  Result Date: 06/02/2021    ECHOCARDIOGRAM LIMITED REPORT   Patient Name:   Jordan [name] Date of Exam: 06/02/2021 Medical Rec #:  [id number]        Height:       64.0 in Accession #:    [id number]       Weight:       164.0  lb Date of Birth:  1966-04-13        BSA:          1.798 m Patient Age:    84 years         BP:           144/103 mmHg Patient Gender: M                HR:           103 bpm. Exam Location:  Inpatient Procedure: Limited Echo, Limited Color Doppler and Cardiac Doppler STAT ECHO Indications:    pericardial effusion  History:        Patient has prior history of Echocardiogram examinations, most                 recent 03/02/2021. CHF,  COPD and end stage renal disease.; Risk                 Factors:Hypertension.  Sonographer:    Johny Chess RDCS Referring Phys: [place]  1. Left ventricular ejection fraction, by estimation, is 40 to 45%. The left ventricle has mildly decreased function. The left ventricle demonstrates global hypokinesis. There is mild concentric left ventricular hypertrophy. Left ventricular diastolic parameters are consistent with Grade II diastolic dysfunction (pseudonormalization).  2. Right ventricular systolic function is normal. The right ventricular size is normal. There is severely elevated pulmonary artery systolic pressure.  3. Left atrial size was mildly dilated.  4. Right atrial size was mildly dilated.  5. Compared with the echo 02/2021, there is now a small pericardial effusion. The IVC is dilated and there is absent collapse of the IVC, suggesting RA pressure is at least 15 mmHg. However, there are no other signs of tamponade. Tamponade physiology is unlikely. a small pericardial effusion is present. The pericardial effusion is circumferential. There is no evidence of cardiac tamponade. Large pleural effusion in the left lateral region.  6. The mitral valve is degenerative. Mild to moderate mitral valve regurgitation. No evidence of mitral stenosis.  7. Tricuspid valve regurgitation is moderate.  8. The aortic valve is tricuspid. There is mild calcification of the aortic valve. There is mild thickening of the aortic valve. Aortic valve regurgitation is not visualized. No aortic stenosis is present.  9. The inferior vena cava is dilated in size with <50% respiratory variability, suggesting right atrial pressure of 15 mmHg. FINDINGS  Left Ventricle: Left ventricular ejection fraction, by estimation, is 40 to 45%. The left ventricle has mildly decreased function. The left ventricle demonstrates global hypokinesis. The left ventricular internal cavity size was normal in size. There is  mild  concentric left ventricular hypertrophy. Left ventricular diastolic parameters are consistent with Grade II diastolic dysfunction (pseudonormalization). Right Ventricle: The right ventricular size is normal. No increase in right ventricular wall thickness. Right ventricular systolic function is normal. There is severely elevated pulmonary artery systolic pressure. The tricuspid regurgitant velocity is 4.21 m/s, and with an assumed right atrial pressure of 15 mmHg, the estimated right ventricular systolic pressure is 0000000 mmHg. Left Atrium: Left atrial size was mildly dilated. Right Atrium: Right atrial size was mildly dilated. Pericardium: Compared with the echo 02/2021, there is now a small pericardial effusion. The IVC is dilated and there is absent collapse of the IVC, suggesting RA pressure is at least 15 mmHg. However, there are no other signs of tamponade. Tamponade physiology is unlikely. A small pericardial effusion is present. The pericardial effusion is circumferential. There  is no evidence of cardiac tamponade. Mitral Valve: The mitral valve is degenerative in appearance. Mild to moderate mitral valve regurgitation, with posteriorly-directed jet. No evidence of mitral valve stenosis. Tricuspid Valve: The tricuspid valve is normal in structure. Tricuspid valve regurgitation is moderate . No evidence of tricuspid stenosis. Aortic Valve: The aortic valve is tricuspid. There is mild calcification of the aortic valve. There is mild thickening of the aortic valve. Aortic valve regurgitation is not visualized. No aortic stenosis is present. Pulmonic Valve: The pulmonic valve was normal in structure. Pulmonic valve regurgitation is not visualized. No evidence of pulmonic stenosis. Aorta: The aortic root is normal in size and structure. Venous: The inferior vena cava is dilated in size with less than 50% respiratory variability, suggesting right atrial pressure of 15 mmHg. IAS/Shunts: No atrial level shunt detected  by color flow Doppler. Additional Comments: There is a large pleural effusion in the left lateral region. Mild ascites is present. LEFT VENTRICLE PLAX 2D LVIDd:         4.80 cm      Diastology LVIDs:         3.90 cm      LV e' medial:    5.98 cm/s LV PW:         1.30 cm      LV E/e' medial:  24.7 LV IVS:        1.30 cm      LV e' lateral:   8.16 cm/s LVOT diam:     1.90 cm      LV E/e' lateral: 18.1 LV SV:         55 LV SV Index:   30 LVOT Area:     2.84 cm  LV Volumes (MOD) LV vol d, MOD A4C: 129.0 ml LV vol s, MOD A4C: 74.7 ml LV SV MOD A4C:     129.0 ml IVC IVC diam: 2.50 cm LEFT ATRIUM         Index LA diam:    4.70 cm 2.61 cm/m  AORTIC VALVE LVOT Vmax:   123.00 cm/s LVOT Vmean:  85.200 cm/s LVOT VTI:    0.193 m  AORTA Ao Asc diam: 3.10 cm MITRAL VALVE                TRICUSPID VALVE MV Area (PHT): 4.96 cm     TR Peak grad:   70.9 mmHg MV Decel Time: 153 msec     TR Vmax:        421.00 cm/s MV E velocity: 148.00 cm/s MV A velocity: 79.80 cm/s   SHUNTS MV E/A ratio:  1.85         Systemic VTI:  0.19 m                             Systemic Diam: 1.90 cm Skeet Latch MD Electronically signed by Skeet Latch MD Signature Date/Time: 06/02/2021/2:36:10 PM    Final     EKG: Atrial fibrillation rapid ventricular response  ASSESSMENT AND PLAN:  A. Fib End-stage renal disease Chest pain Shortness of breath Anemia Hypertension History of alcohol abuse COPD GERD Hyperkalemia . Plan Continue rate control for atrial fibrillation Maintain  dialysis management and control Protonix therapy for reflux type symptoms Continue inhalers for COPD and respiratory support Continue hydralazine for hypertension management Continue Epogen for anemia Agree with echocardiogram which showed severe mild cardiomyopathy Consider adding nitrate with hydralazine to help with mild cardiomyopathy  Will probably avoid ARB ACE or Arni with his hyperkalemia for now Transition IV Cardizem to p.o. to maintain rate  control Long-term anticoagulation can be pursued with Eliquis   Signed: Yolonda Kida MD 06/27/2021, 7:35 AM

## 2021-06-27 NOTE — Consult Note (Addendum)
NAME:  Jordan [name], MRN:  [id number], DOB:  1966-01-07, LOS: 1 ADMISSION DATE:  06/26/2021, CONSULTATION DATE: 06/27/2021 REFERRING MD: Sharion Settler, NP, CHIEF COMPLAINT: Shortness of breath   HPI  55 y.o with significant PMH of ESRD on HD MWF although noncompliant with HD, HTN, CHF, atrial fibrillation and COPD who presented to the ED with chief complaints of acute onset worsening dyspnea, chest discomfort, and abdominal distention after missing multiple dialysis session due to being incarcerated for 1 week.  ED Course: On arrival to the ED, he was afebrile with blood pressure 153/108 mm Hg and pulse rate 95 beats/min, oxygen saturation 100% on room air.  He was noted with rales and rhonchi to bilateral lungs with abdominal distention consistent with edema/ascites.  Due to concerns of fluid overload related to missed hemodialysis chest x-ray was obtained which showed pulmonary edema and concern for possible Pericardial effusion.  Labs significant for potassium of 6.1.  Patient received calcium gluconate, insulin and D50.  Nephrology was consulted who advised the patient to be dialyzed and admitted for further management.  Following completion of his hemodialysis, patient went into A. fib with RVR and was started on diltiazem drip for rate control.  Patient was admitted under hospitalist service.  Hospital Course: During the course of admission, patient remained on Dilts drip for A. fib with RVR.  On 10/6 at around 8 PM rapid response called for signs of anaphylactic reaction.  Patient noted with orolingual angioedema without associated Hypoxemia, tachypnea, rash/hives, hypotension or bronchospasm/wheezing. On arrival to the ICU, Physical examination revealed an edematous tongue and asymmetric edema in the neck right>left with increased respiratory distress and stridor. He was treated with racemic epinephrine via nebulizer, IM epinephrine 0.3 mg, Benadryl 50 mg IV, Pepcid 20 mg IV, and Solu-Medrol  125 mg IV with immediate response. Detailed enquiry did not reveal anything ingested, contacted, inhaled or otherwise exposed to prior to incident. Patient continued to protect his airway and therefore did not require immediate airway management. PCCM consulted.  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 fib with RVR 10/6: Rapid response called for anaphylactic/hypersensitivity reaction.  Patient transferred to the ICU.  PCCM consulted  Consults:  PCCM Nephrology  Procedures:  none  Significant Diagnostic Tests:  10/6: Chest Xray>Progressive mild to moderate pulmonary edema with underlying pericardial effusion   Micro Data:  10/5: SARS-CoV-2 PCR> negative 10/5: Influenza PCR> negative  Antimicrobials:  None  OBJECTIVE  Blood pressure (!) 161/98, pulse 86, temperature (!) 97.5 F (36.4 C), temperature source Axillary, resp. rate 18, height '5\' 4"'$  (1.626 m), weight 82.5 kg, SpO2 95 %.        Intake/Output Summary (Last 24 hours) at 06/27/2021 2052 Last data filed at 06/27/2021 1805 Gross per 24 hour  Intake 240 ml  Output --  Net 240 ml   Filed Weights   06/26/21 0759 06/27/21 2020  Weight: 72.6 kg 82.5 kg   Physical Examination  GENERAL: 55 year-old critically ill patient lying in the bed in moderate respiratory distress.  EYES: Pupils equal, round, reactive to light and accommodation. No scleral icterus. Extraocular muscles intact.  HEENT: Head atraumatic, normocephalic. Orolingual angioedema noted  NECK:  Supple, moderate jugular venous  distention. Neck swelling bilaterally left>right No thyroid enlargement, no  tenderness.  LUNGS: Decreased breath sounds bilaterally, mild to moderate wheezing, rales,rhonchi.  No crepitation. mild use of accessory muscles of respiration.  CARDIOVASCULAR: S1, S2 normal. No murmurs, rubs, or gallops.  ABDOMEN: Markedly distended abdomen/ascites. Bowel sounds present. No organomegaly or mass.  EXTREMITIES: Generalized edema, cyanosis, or clubbing.  NEUROLOGIC: Cranial nerves II through XII are intact.  Moving all extremities. Sensation intact. Gait not checked.  PSYCHIATRIC: The patient is alert and oriented to self SKIN: No obvious rash, lesion, or ulcer.   Labs/imaging that I havepersonally reviewed  (right click and "Reselect all SmartList Selections" daily)     Labs   CBC: Recent Labs  Lab 06/26/21 0804 06/27/21 0607  WBC 9.6 8.1  NEUTROABS 7.6  --   HGB 7.1* 7.3*  HCT 23.0* 23.1*  MCV 101.8* 102.7*  PLT 150 144*    Basic Metabolic Panel: Recent Labs  Lab 06/26/21 0804 06/26/21 1725 06/27/21 0607  NA 145 139 143  K 6.1* 4.0 4.3  CL 102 97* 99  CO2 20* 26 24  GLUCOSE 95 93 116*  BUN 107* 72* 81*  CREATININE 18.65* 11.47* 12.40*  CALCIUM 9.4 9.2 8.9  MG 2.5*  --   --   PHOS 9.6*  --   --    GFR: Estimated Creatinine Clearance: 6.7 mL/min (A) (by C-G formula based on SCr of 12.4 mg/dL (H)). Recent Labs  Lab 06/26/21 0804 06/27/21 0607  WBC 9.6 8.1  LATICACIDVEN 1.6  --     Liver Function Tests: Recent Labs  Lab 06/26/21 0804  AST 20  ALT 14  ALKPHOS 102  BILITOT 1.6*  PROT 8.8*  ALBUMIN 3.9   No results for input(s): LIPASE, AMYLASE in the last 168 hours. No results for input(s): AMMONIA in the last 168 hours.  ABG    Component Value Date/Time   PHART 7.45 06/26/2021 1754   PCO2ART 38 06/26/2021 1754   PO2ART 161 (H) 06/26/2021 1754   HCO3 26.4 06/26/2021 1754   TCO2 19 (L) 06/02/2021 1152   ACIDBASEDEF 9.8 (H) 05/28/2021 2037   O2SAT 99.5 06/26/2021 1754     Coagulation Profile: No results for input(s): INR,  PROTIME in the last 168 hours.  Cardiac Enzymes: No results for input(s): CKTOTAL, CKMB, CKMBINDEX, TROPONINI in the last 168 hours.  HbA1C: Hgb A1c MFr Bld  Date/Time Value Ref Range Status  04/07/2020 11:59 AM 5.0 4.8 - 5.6 % Final    Comment:    (NOTE) Pre diabetes:          5.7%-6.4%  Diabetes:              >6.4%  Glycemic control for   <7.0% adults with diabetes   06/23/2019 08:40 AM 6.3 4.6 - 6.5 % Final    Comment:    Glycemic Control Guidelines for People with Diabetes:Non Diabetic:  <6%Goal of Therapy: <7%Additional Action Suggested:  >8%     CBG: Recent Labs  Lab 06/27/21 2024  GLUCAP 121*    Review of Systems:   UNABLE TO OBTAIN DUE TO PATIENT IN RESPIRATORY DISTRESS  Past Medical History  He,  has a past medical history of Anemia, Aortic atherosclerosis ([place]) (11/12/2019), ED (erectile dysfunction), Emphysema of lung ([place]) (11/12/2019), ESRD on hemodialysis ([place]), ETOH abuse, History of blood transfusion, Hypertension, and Wears dentures.   Surgical History    Past Surgical History:  Procedure Laterality Date   BASCILIC VEIN TRANSPOSITION Left 08/07/2016  Procedure: LEFT BASILIC VEIN TRANSPOSITION;  Surgeon: Angelia Mould, MD;  Location: [place];  Service: Vascular;  Laterality: Left;   CLOSED REDUCTION NASAL FRACTURE N/A 08/11/2019   Procedure: CLOSED REDUCTION NASAL FRACTURE WITH STABILIZATION;  Surgeon: Irene Limbo, MD;  Location: [place];  Service: Plastics;  Laterality: N/A;   COLONOSCOPY N/A 08/12/2016   Procedure: COLONOSCOPY;  Surgeon: Otis Brace, MD;  Location: [place];  Service: Gastroenterology;  Laterality: N/A;   COLONOSCOPY WITH PROPOFOL N/A 06/05/2021   Procedure: COLONOSCOPY WITH PROPOFOL;  Surgeon: Sharyn Creamer, MD;  Location: [place];  Service: Gastroenterology;  Laterality: N/A;   ESOPHAGOGASTRODUODENOSCOPY N/A 08/12/2016   Procedure: ESOPHAGOGASTRODUODENOSCOPY (EGD);  Surgeon: Otis Brace, MD;  Location:  [place];  Service: Gastroenterology;  Laterality: N/A;   ESOPHAGOGASTRODUODENOSCOPY (EGD) WITH PROPOFOL N/A 06/05/2021   Procedure: ESOPHAGOGASTRODUODENOSCOPY (EGD) WITH PROPOFOL;  Surgeon: Sharyn Creamer, MD;  Location: [place];  Service: Gastroenterology;  Laterality: N/A;   INSERTION OF DIALYSIS CATHETER N/A 08/07/2016   Procedure: INSERTION OF TUNNELED DIALYSIS CATHETER;  Surgeon: Angelia Mould, MD;  Location: [place];  Service: Vascular;  Laterality: N/A;   LIGATION OF ARTERIOVENOUS  FISTULA Left 09/12/2016   Procedure: BANDING OF LEFT  ARTERIOVENOUS  FISTULA;  Surgeon: Angelia Mould, MD;  Location: [facility];  Service: Vascular;  Laterality: Left;   LOWER EXTREMITY INTERVENTION Right 12/02/2018   Procedure: LOWER EXTREMITY INTERVENTION;  Surgeon: Algernon Huxley, MD;  Location: [facility];  Service: Cardiovascular;  Laterality: Right;   POLYPECTOMY  06/05/2021   Procedure: POLYPECTOMY;  Surgeon: Sharyn Creamer, MD;  Location: [facility];  Service: Gastroenterology;;     Social History   reports that he has been smoking cigarettes. He has a 20.00 pack-year smoking history. He has never used smokeless tobacco. He reports that he does not currently use alcohol after a past usage of about 21.0 standard drinks per week. He reports that he does not use drugs.   Family History   His family history includes Diabetes in his mother; Healthy in his father and sister; Kidney disease in his daughter; Kidney failure in his brother and mother. There is no history of Post-traumatic stress disorder, Bladder Cancer, or Kidney cancer.   Allergies Allergies  Allergen Reactions   Lisinopril Swelling     Home Medications  Prior to Admission medications   Medication Sig Start Date End Date Taking? Authorizing Provider  albuterol (VENTOLIN HFA) 108 (90 Base) MCG/ACT inhaler Inhale 2 puffs into the lungs every 4 (four) hours as needed for wheezing or shortness of breath. 06/05/21   Yes Danford, Suann Larry, MD  calcium acetate (PHOSLO) 667 MG capsule Take 2 capsules (1,334 mg total) by mouth 3 (three) times daily with meals. 06/05/21  Yes Danford, Suann Larry, MD  cinacalcet (SENSIPAR) 30 MG tablet Take 1 tablet (30 mg total) by mouth daily with breakfast. 06/05/21  Yes Danford, Suann Larry, MD  docusate sodium (COLACE) 100 MG capsule Take 1 capsule (100 mg total) by mouth 2 (two) times daily. 06/05/21  Yes Danford, Suann Larry, MD  epoetin alfa (EPOGEN) 4000 UNIT/ML injection Inject 1 mL (4,000 Units total) into the vein every Monday, Wednesday, and Friday with hemodialysis. 06/22/20  Yes Enzo Bi, MD  ferrous sulfate 325 (65 FE) MG tablet Take 1 tablet (325 mg total) by mouth 2 (two) times daily with a meal. 06/05/21  Yes Danford, Suann Larry, MD  hydrALAZINE (APRESOLINE) 50 MG tablet Take 1 tablet (50 mg  total) by mouth 3 (three) times daily. 06/05/21  Yes Danford, Suann Larry, MD  pantoprazole (PROTONIX) 20 MG tablet Take 1 tablet (20 mg total) by mouth 2 (two) times daily before a meal. 06/05/21 08/04/21 Yes Danford, Suann Larry, MD    Scheduled Meds:  calcium acetate  1,334 mg Oral TID WC   Chlorhexidine Gluconate Cloth  6 each Topical Q0600   cinacalcet  30 mg Oral Q breakfast   docusate sodium  100 mg Oral BID   ferrous sulfate  325 mg Oral BID WC   furosemide  40 mg Intravenous Q12H   heparin  5,000 Units Subcutaneous Q8H   hydrALAZINE  50 mg Oral TID   pantoprazole  20 mg Oral BID AC   Continuous Infusions:  diltiazem (CARDIZEM) infusion Stopped (06/27/21 2059)   PRN Meds:.acetaminophen **OR** acetaminophen, albuterol, magnesium hydroxide, promethazine, traZODone  Assessment & Plan:  Acute on Chronic respiratory failure with hypoxia due to pulmonary edema from hypervolemia exacerbated by hypersensitivity reaction  Hx: COPD, Emphysema, tobacco abuse. -Supplemental O2 as needed to maintain O2 saturations 88 to 92% -Follow intermittent ABG and chest  x-ray as needed -Repeat CXR on  -IV Lasix as blood pressure and renal function permits; currently on Lasix 40 mg IV BID -As needed bronchodilators -Encourage smoking cessation   Hypersensitivity reaction to unknown antigen Sudden development of orolingual angioedema, neck swelling, shortness of breath, stridor, hypoxia, throat tightness, and altered mental status Status post racemic epinephrine via nebulizer, IM epinephrine 0.3 mg, Benadryl 50 mg IV, Pepcid 20 mg IV, and Solu-Medrol 125 mg IV x 1 dose -High risk for intubation (Patient is a DNR but consent to intubation for airway protection if conditions worsens) -Obtain CT for further evaluation of neck swelling -Will consider  vasopressor if hypotensive keep MAP > than 65 mmHg given renal patient on HD -Monitor for biphasic reaction  Acute on chronic combined CHF Pericardial effusion likely in setting of uremia Hx of HTN Volume overload in the setting of missed HD s/p HD on admission 10/5 -Continuous cardiac monitoring -IV Lasix as blood pressure and renal function permits; currently on Lasix 40 mg IV BID -Hold BP meds in the setting of hypersensitivity reaction with potential hypotension -Cardiology consult -Repeat 2D Echocardiogram  Afib+RVR  -Continue Diltiazem 0.'25mg'$ /kg ('15mg'$ ) IV x1 then maintain 5-'15mg'$ /hr drip -Consider weaning to oral Diltiazem 30-'90mg'$  PO qid while titrating dose, then transition to SA daily formulation  # Thromboembolism Risk Management, likely not a candidate for anticoagulation given noncompliance -Cardiology consult  ESRD on HD MWF Metabolic bone disease -Monitor I&O's / urinary output -Follow BMP -Ensure adequate renal perfusion -Replace electrolytes as indicated -Renal diet -Renally dose meds -Intermittent HD per renal- planning for tomorrow  Acute metabolic encephalopathy 2nd to hypoxia, uremia. ETOH Abuse high risk for withdrawal. -may need precedex with RASS goal 0 to -1 -Monitor CMP, INR,  Daily BMP+Mg -CIWA symptom triggered PRNs -Daily Thiamine, Folate, MVI once tolerating PO -Insomnia Options: Trazodone if needed -SW consult for cessation resources -PT/OT evaluation for mobility  Anemia of critical illness and chronic disease. Hgb 7.3 appears to be at baseline -f/u CBC intermittently -transfuse If Hb < 7 or significant bleeding    Best practice:  Diet:  Oral Pain/Anxiety/Delirium protocol (if indicated): No VAP protocol (if indicated): Not indicated DVT prophylaxis: Subcutaneous Heparin GI prophylaxis: PPI Glucose control:  SSI Yes Central venous access:  N/A Arterial line:  N/A Foley:  N/A Mobility:  bed rest  PT consulted: N/A Last date  of multidisciplinary goals of care discussion [10/6] Code Status:  FULL Disposition: ICU   = Goals of Care = Code Status Order: FULL  Primary Emergency ContactAdahir, Yarosh, Home Phone: [phone] Wishes to pursue full aggressive treatment and intervention options, including CPR and intubation, but goals of care will be addressed on going with family if that should become necessary.   Critical care time: 45 minutes     Rufina Falco, DNP, CCRN, FNP-C, AGACNP-BC Acute Care Nurse Practitioner  [facility] Pager: [phone] [place] at [facility]  .

## 2021-06-27 NOTE — ED Notes (Signed)
Pt asked for peanut butter and saltine crackers. Pt was given peanut butter, saltine crackers and apple juice.

## 2021-06-27 NOTE — Progress Notes (Signed)
[facility]  ROUNDING NOTE   Subjective:   Mr. Jordan SCHERF was admitted to [facility] on 06/26/2021 for Atrial fibrillation with RVR ([place]) [I48.91] Placed on diltiazem drip.   Patient had hemodialysis treatment yesterday in the hemodialysis suite at [facility]. Tolerated treatment well. UF of 537m.   Last hemodialysis treatment as an outpatient was 06/07/21.    Objective:  Vital signs in last 24 hours:  Temp:  [97.1 F (36.2 C)-98 F (36.7 C)] 97.5 F (36.4 C) (10/06 0634) Pulse Rate:  [58-136] 130 (10/06 0943) Resp:  [12-24] 24 (10/06 0943) BP: (102-147)/(63-104) 128/85 (10/06 1000) SpO2:  [76 %-100 %] 95 % (10/06 0943)  Weight change:  Filed Weights   06/26/21 0759  Weight: 72.6 kg    Intake/Output: I/O last 3 completed shifts: In: 50 [IV Piggyback:50] Out: 500 [Other:500]   Intake/Output this shift:  No intake/output data recorded.  Physical Exam: General: NAD, laying in bed  Head: Normocephalic, atraumatic. Moist oral mucosal membranes  Eyes: Anicteric, PERRL  Neck: Supple, trachea midline  Lungs:  Clear to auscultation  Heart: Irregular and tachycardia  Abdomen:  Soft, nontender  Extremities:  + peripheral edema.  Neurologic: Nonfocal, moving all four extremities  Skin: Multiple abrasions  Access: Left AVF    Basic Metabolic Panel: Recent Labs  Lab 06/26/21 0804 06/26/21 1725 06/27/21 0607  NA 145 139 143  K 6.1* 4.0 4.3  CL 102 97* 99  CO2 20* 26 24  GLUCOSE 95 93 116*  BUN 107* 72* 81*  CREATININE 18.65* 11.47* 12.40*  CALCIUM 9.4 9.2 8.9  MG 2.5*  --   --   PHOS 9.6*  --   --      Liver Function Tests: Recent Labs  Lab 06/26/21 0804  AST 20  ALT 14  ALKPHOS 102  BILITOT 1.6*  PROT 8.8*  ALBUMIN 3.9    No results for input(s): LIPASE, AMYLASE in the last 168 hours. No results for input(s): AMMONIA in the last 168 hours.  CBC: Recent Labs  Lab 06/26/21 0804 06/27/21 0607  WBC 9.6 8.1  NEUTROABS 7.6  --   HGB 7.1*  7.3*  HCT 23.0* 23.1*  MCV 101.8* 102.7*  PLT 150 144*     Cardiac Enzymes: No results for input(s): CKTOTAL, CKMB, CKMBINDEX, TROPONINI in the last 168 hours.  BNP: Invalid input(s): POCBNP  CBG: No results for input(s): GLUCAP in the last 168 hours.  Microbiology: Results for orders placed or performed during the hospital encounter of 06/26/21  Resp Panel by RT-PCR (Flu A&B, Covid) Nasopharyngeal Swab     Status: None   Collection Time: 06/26/21  8:04 AM   Specimen: Nasopharyngeal Swab; Nasopharyngeal(NP) swabs in vial transport medium  Result Value Ref Range Status   SARS Coronavirus 2 by RT PCR NEGATIVE NEGATIVE Final    Comment: (NOTE) SARS-CoV-2 target nucleic acids are NOT DETECTED.  The SARS-CoV-2 RNA is generally detectable in upper respiratory specimens during the acute phase of infection. The lowest concentration of SARS-CoV-2 viral copies this assay can detect is 138 copies/mL. A negative result does not preclude SARS-Cov-2 infection and should not be used as the sole basis for treatment or other patient management decisions. A negative result may occur with  improper specimen collection/handling, submission of specimen other than nasopharyngeal swab, presence of viral mutation(s) within the areas targeted by this assay, and inadequate number of viral copies(<138 copies/mL). A negative result must be combined with clinical observations, patient history, and epidemiological  information. The expected result is Negative.  Fact Sheet for Patients:  EntrepreneurPulse.com.au  Fact Sheet for Healthcare Providers:  IncredibleEmployment.be  This test is no t yet approved or cleared by the [place] FDA and  has been authorized for detection and/or diagnosis of SARS-CoV-2 by FDA under an Emergency Use Authorization (EUA). This EUA will remain  in effect (meaning this test can be used) for the duration of the COVID-19  declaration under Section 564(b)(1) of the Act, 21 U.S.C.section 360bbb-3(b)(1), unless the authorization is terminated  or revoked sooner.       Influenza A by PCR NEGATIVE NEGATIVE Final   Influenza B by PCR NEGATIVE NEGATIVE Final    Comment: (NOTE) The Xpert Xpress SARS-CoV-2/FLU/RSV plus assay is intended as an aid in the diagnosis of influenza from Nasopharyngeal swab specimens and should not be used as a sole basis for treatment. Nasal washings and aspirates are unacceptable for Xpert Xpress SARS-CoV-2/FLU/RSV testing.  Fact Sheet for Patients: EntrepreneurPulse.com.au  Fact Sheet for Healthcare Providers: IncredibleEmployment.be  This test is not yet approved or cleared by the [place] FDA and has been authorized for detection and/or diagnosis of SARS-CoV-2 by FDA under an Emergency Use Authorization (EUA). This EUA will remain in effect (meaning this test can be used) for the duration of the COVID-19 declaration under Section 564(b)(1) of the Act, 21 U.S.C. section 360bbb-3(b)(1), unless the authorization is terminated or revoked.  Performed at [facility], [address] [place], [place] [address]     Coagulation Studies: No results for input(s): LABPROT, INR in the last 72 hours.  Urinalysis: No results for input(s): COLORURINE, LABSPEC, PHURINE, GLUCOSEU, HGBUR, BILIRUBINUR, KETONESUR, PROTEINUR, UROBILINOGEN, NITRITE, LEUKOCYTESUR in the last 72 hours.  Invalid input(s): APPERANCEUR    Imaging: DG Chest Portable 1 View  Result Date: 06/26/2021 CLINICAL DATA:  Short of breath EXAM: PORTABLE CHEST 1 VIEW COMPARISON:  None. FINDINGS: Stable enlarged cardiac silhouette. There is diffuse mild airspace disease. No focal consolidation. Pleural fluid or pneumothorax. IMPRESSION: Cardiomegaly and pulmonary edema pattern. Potential component of pericardial effusion. Electronically Signed   By: Suzy Bouchard M.D.    On: 06/26/2021 08:35     Medications:    diltiazem (CARDIZEM) infusion 15 mg/hr (06/27/21 0944)    calcium acetate  1,334 mg Oral TID WC   Chlorhexidine Gluconate Cloth  6 each Topical Q0600   cinacalcet  30 mg Oral Q breakfast   docusate sodium  100 mg Oral BID   ferrous sulfate  325 mg Oral BID WC   furosemide  40 mg Intravenous Q12H   heparin  5,000 Units Subcutaneous Q8H   hydrALAZINE  50 mg Oral TID   pantoprazole  20 mg Oral BID AC     Assessment/ Plan:  Mr. KHIEM HUNN is a 55 y.o. black male with end stage renal disease on hemodialysis, hypertension, COPD, EtOH abuse, atrial fibrillation, congestive heart failure who is admitted to [facility] on Atrial fibrillation with RVR ([place]) [I48.91]   [facility] MWF [place]. Left AVF 67kg  End Stage Renal Disease: hyperkalemia, pulmonary edema and volume overload on admission. Hemodialysis treatment yesterday with 2K bath.  - Schedule dialysis for tomorrow and resume MWF schedule.   Hypertension: with hypertensive urgency and atrial fibrillation with rapid ventricular response on admission. Current blood pressure of 128/85 - at goal.  - Continue hydralazine - IV furosemide - diltiazem gtt - Appreciate cardiology input.    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  - Continue calcium acetate with meals and cinacalcet  Anemia with chronic kidney disease: hemoglobin 7.3. Macrocytic. Not getting outpatient ESA. Iron studies from 9/12 are acceptable. Vitamin [id number] and folic acid levels at goal.  - ESA with next inpatient HD treatment   LOS: 1 Shereece Wellborn 10/6/202211:17 AM

## 2021-06-27 NOTE — Progress Notes (Signed)
Rapid Response Event Note   Reason for Call : Shortness of breath and episode of unresponsiveness per floor RN.  Initial Focused Assessment: Patient short of breath and seen to have tongue/airway swelling. In distress and struggling to breathe with white frothy secretions.  Interventions:  - Oxygen - EpiPen administration for swelling per NP  Plan of Care:  - Patient was transferred to ICU for closer monitoring.  Event Summary:  - Transferred to ICU 02.  MD Notified: Sharion Settler, NP Call Time: [phone] Arrival Time: [place] End Time: [place], RN

## 2021-06-27 NOTE — ED Notes (Signed)
Pt yelling in room.  Upon entering patient found to not be hooked to monitoring cords or cardizem.  Pt states he got up to use the bathroom.  Pt reconnected to all, told importance of using call bell and reason for treatments being received.  Pt states "I got all that, what can you get me to eat besides that shit?"  Will continue to monitor.

## 2021-06-27 NOTE — Progress Notes (Signed)
Contacted from ED RN, Toni Arthurs that patient HR 50s and hypotensive. This RN notified MD Sreenath via secure chat and MD ordered to stop gtt at this time. This RN secure chat ED RN to stop gtt and ED RN responded that he would stop the gtt now.

## 2021-06-27 NOTE — ED Notes (Signed)
Pt continues to press call bell over and over again stating that he is ready to go and does not care and that he is a "grown ass man I could be putting my clothes on now to go". Pt has had his medical care explained to him twice now pt does not seem to care about the Cardizem controlling his HR. Doctor notified.

## 2021-06-27 NOTE — Significant Event (Signed)
RN went to pt room due to bed alarm going off. Pt sitting on side of bed asking to get "washed up at the sink". Pt also states his neck feels swollen and states it started 1 hour ago. Resps unlabored, speaking in full sentences but says yes when RN asks if SOB. Lung sounds dim. Left side of neck obviously swollen. RN instructed pt to lay back in bed and RN paged NP to bedside.  While NP at bedside, pt slumped over in bed and needed physical stimuli to become responsive. Sats 92% RA.Marland Kitchen Pt transferred to ICU via bed per order and report given bedside to [facility], ICU RN.

## 2021-06-27 NOTE — Progress Notes (Addendum)
Cross Cover Nurse reported patient complained swelling eft side of neck. Bedside patient alert and oriented. Significant soft tissue swelling left side of neck , soft and without crepitus. Oral edema present as well. 125 Solu medrol. 50 benadryl  given.  Patient had change in mentation slumped over in the bed but came to and was conversive. SR rate 82 , oxygen sats 92 % on room air. Some sonorous respirations. Called RT to bedside and requested racemic epi. Contacted critical care NP, transferred to ICU for further airway management

## 2021-06-27 NOTE — Progress Notes (Signed)
Rapid Response called. Upon arrival, patient with noted oral and eyelid swelling. Patient had stridor present with labored respirations. SpO2 was 96% on room air. Racemic Epinephrine ordered and given. Patient transferred to ICU for further evaluation.

## 2021-06-28 ENCOUNTER — Inpatient Hospital Stay: Payer: Medicare Other

## 2021-06-28 ENCOUNTER — Inpatient Hospital Stay: Payer: Medicare Other | Admitting: Anesthesiology

## 2021-06-28 LAB — PREPARE RBC (CROSSMATCH)

## 2021-06-28 LAB — BLOOD GAS, ARTERIAL
Acid-base deficit: 1.5 mmol/L (ref 0.0–2.0)
Bicarbonate: 23.7 mmol/L (ref 20.0–28.0)
FIO2: 0.5
MECHVT: 480 mL
O2 Saturation: 95.8 %
PEEP: 5 cmH2O
Patient temperature: 37
RATE: 18 resp/min
pCO2 arterial: 41 mmHg (ref 32.0–48.0)
pH, Arterial: 7.37 (ref 7.350–7.450)
pO2, Arterial: 83 mmHg (ref 83.0–108.0)

## 2021-06-28 LAB — GLUCOSE, CAPILLARY
Glucose-Capillary: 102 mg/dL — ABNORMAL HIGH (ref 70–99)
Glucose-Capillary: 112 mg/dL — ABNORMAL HIGH (ref 70–99)
Glucose-Capillary: 118 mg/dL — ABNORMAL HIGH (ref 70–99)
Glucose-Capillary: 120 mg/dL — ABNORMAL HIGH (ref 70–99)
Glucose-Capillary: 124 mg/dL — ABNORMAL HIGH (ref 70–99)

## 2021-06-28 LAB — BASIC METABOLIC PANEL
Anion gap: 16 — ABNORMAL HIGH (ref 5–15)
Anion gap: 16 — ABNORMAL HIGH (ref 5–15)
BUN: 92 mg/dL — ABNORMAL HIGH (ref 6–20)
BUN: 55 mg/dL — ABNORMAL HIGH (ref 6–20)
CO2: 23 mmol/L (ref 22–32)
CO2: 26 mmol/L (ref 22–32)
Calcium: 8.5 mg/dL — ABNORMAL LOW (ref 8.9–10.3)
Calcium: 8.4 mg/dL — ABNORMAL LOW (ref 8.9–10.3)
Chloride: 98 mmol/L (ref 98–111)
Chloride: 94 mmol/L — ABNORMAL LOW (ref 98–111)
Creatinine, Ser: 12.9 mg/dL — ABNORMAL HIGH (ref 0.61–1.24)
Creatinine, Ser: 8.49 mg/dL — ABNORMAL HIGH (ref 0.61–1.24)
GFR, Estimated: 4 mL/min — ABNORMAL LOW (ref >60)
GFR, Estimated: 7 mL/min — ABNORMAL LOW (ref >60)
Glucose, Bld: 129 mg/dL — ABNORMAL HIGH (ref 70–99)
Glucose, Bld: 107 mg/dL — ABNORMAL HIGH (ref 70–99)
Potassium: 5.6 mmol/L — ABNORMAL HIGH (ref 3.5–5.1)
Potassium: 4.5 mmol/L (ref 3.5–5.1)
Sodium: 137 mmol/L (ref 135–145)
Sodium: 136 mmol/L (ref 135–145)

## 2021-06-28 LAB — CBC
HCT: 20 % — ABNORMAL LOW (ref 39.0–52.0)
Hemoglobin: 6.2 g/dL — ABNORMAL LOW (ref 13.0–17.0)
MCH: 30.8 pg (ref 26.0–34.0)
MCHC: 31 g/dL (ref 30.0–36.0)
MCV: 99.5 fL (ref 80.0–100.0)
Platelets: 116 10*3/uL — ABNORMAL LOW (ref 150–400)
RBC: 2.01 MIL/uL — ABNORMAL LOW (ref 4.22–5.81)
RDW: 15.3 % (ref 11.5–15.5)
WBC: 5.4 10*3/uL (ref 4.0–10.5)
nRBC: 0 % (ref 0.0–0.2)

## 2021-06-28 LAB — MAGNESIUM: Magnesium: 2 mg/dL (ref 1.7–2.4)

## 2021-06-28 LAB — HEMOGLOBIN AND HEMATOCRIT, BLOOD
HCT: 24.3 % — ABNORMAL LOW (ref 39.0–52.0)
Hemoglobin: 7.6 g/dL — ABNORMAL LOW (ref 13.0–17.0)

## 2021-06-28 LAB — HEPATITIS B SURFACE ANTIBODY, QUANTITATIVE: Hep B S AB Quant (Post): >1000 m[IU]/mL (ref >9.9)

## 2021-06-28 IMAGING — CT CT NECK W/O CM
4 of 5 series · 14 of 35 positions shown, 16 images · non-contrast
Comparison: [DATE]

CLINICAL DATA: Foreign body suspected, neck, neg xray. Left neck
swelling. Clinical concern for angioedema. Renal failure on
dialysis.

EXAM:
CT NECK WITHOUT CONTRAST
TECHNIQUE: Multidetector CT imaging of the neck was performed following the
standard protocol without intravenous contrast.

[Series 3: axial neck · axial · 0.60mm/px · z∈[+164,+218]mm · 2 of 109 slices shown]
[im 28/109  bone]
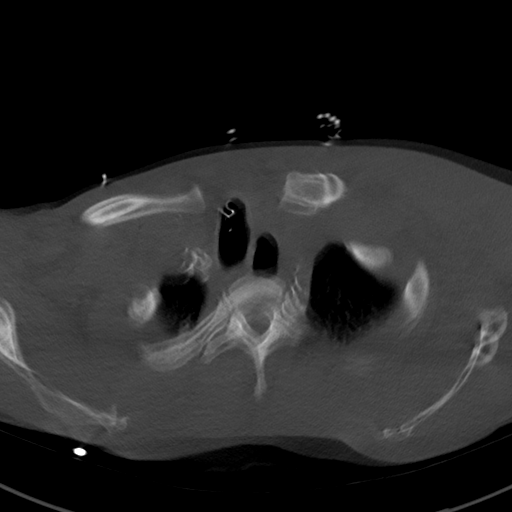
[im 55/109  bone]
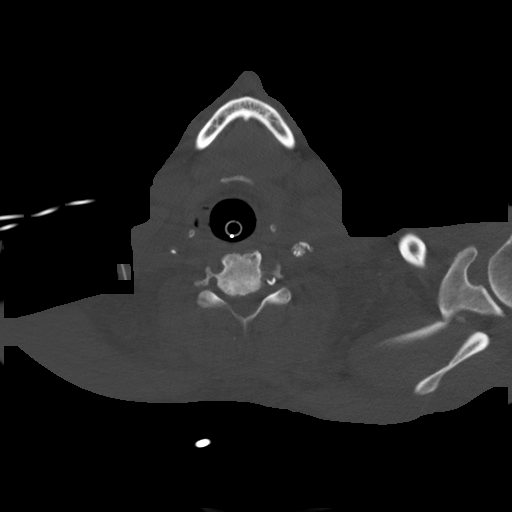

[Series 9: sag neck · sagittal · 0.52mm/px · 5 of 104 slices shown, 6 images]
[im 35/104  bone]
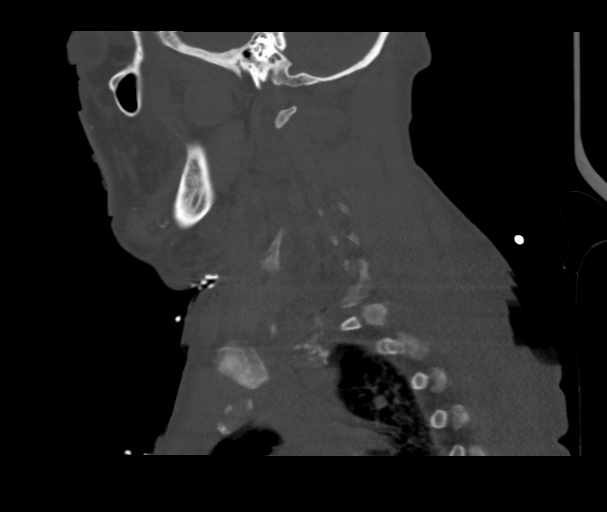
[im 43/104  bone]
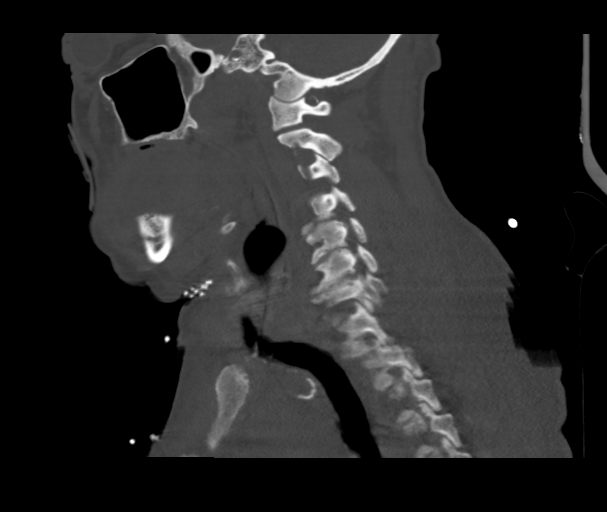
[im 52/104  soft-tissue]
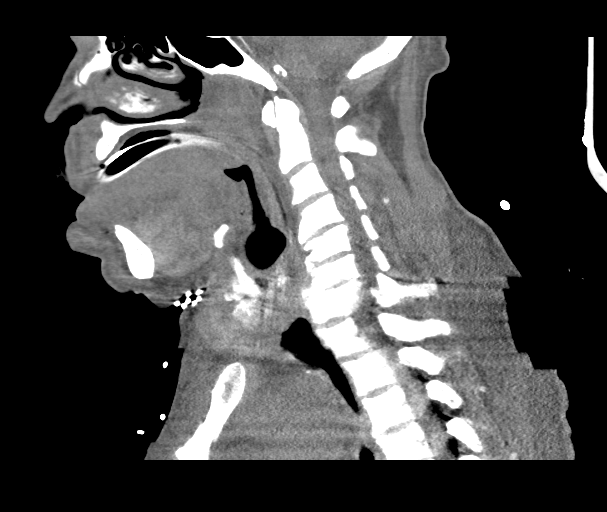
[im 52/104  bone]
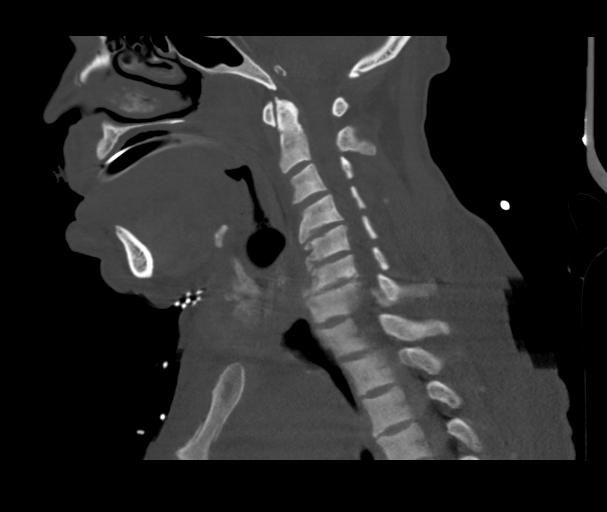
[im 61/104  bone]
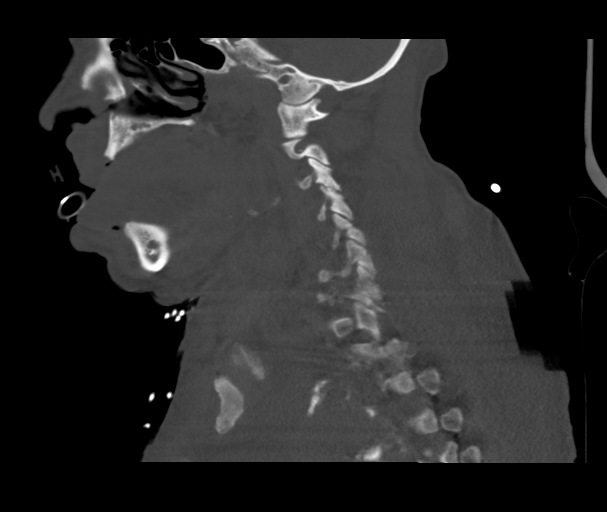
[im 69/104  bone]
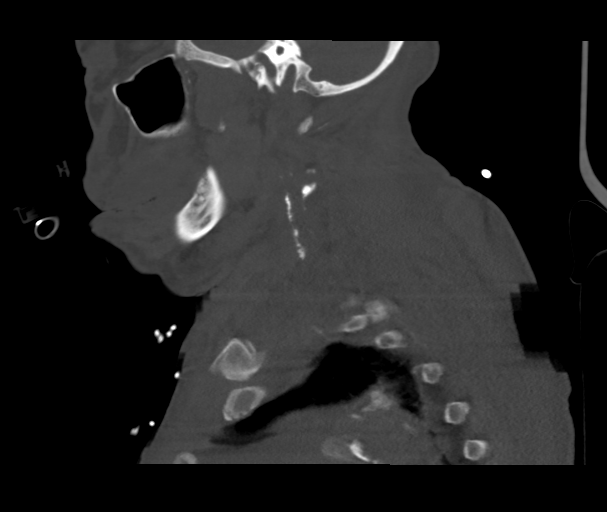

[Series 10: cor neck · coronal · 0.45mm/px · 3 of 142 slices shown]
[im 29/142  bone]
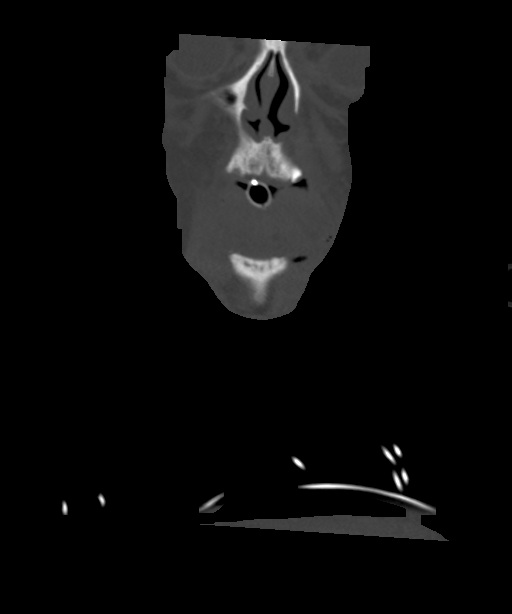
[im 57/142  bone]
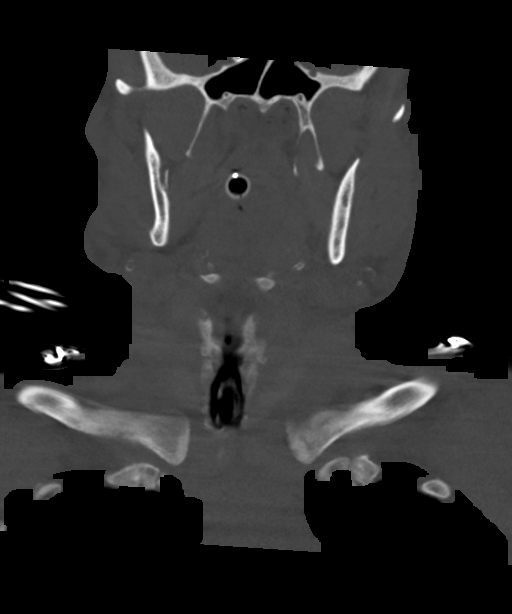
[im 85/142  bone]
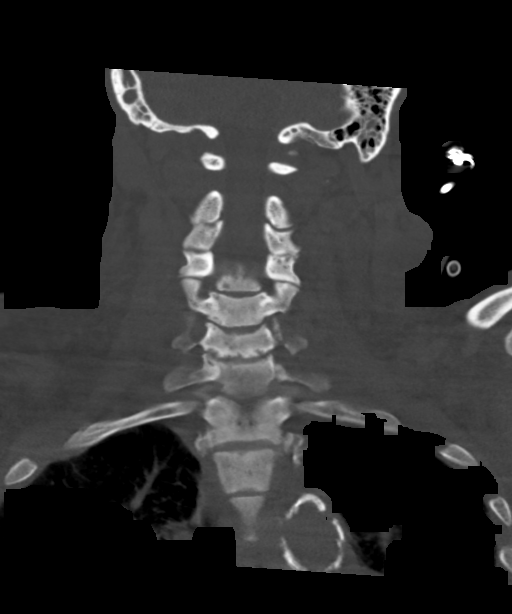

[Series 11: orthogonal ax · axial · 0.45mm/px · z∈[+107,+272]mm · 4 of 140 slices shown, 5 images]
[im 28/140  soft-tissue]
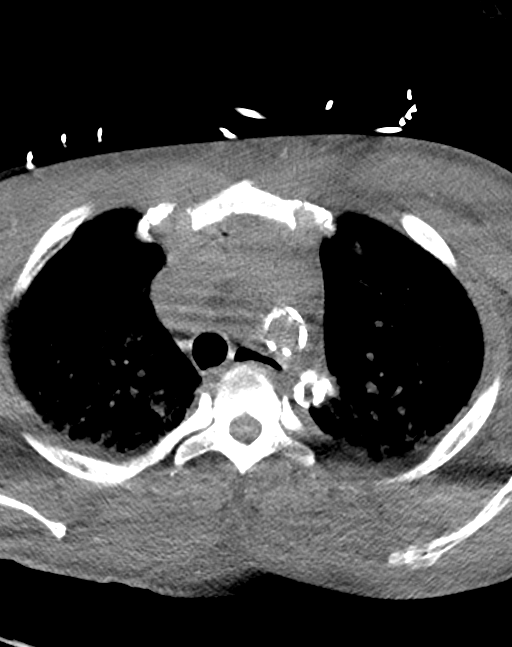
[im 28/140  bone]
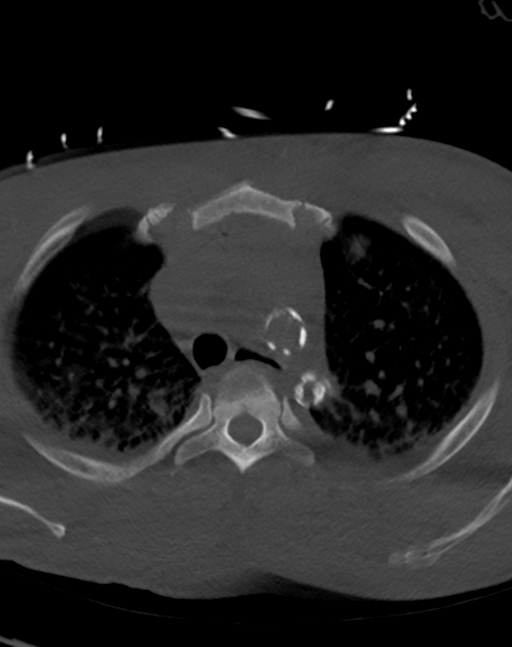
[im 56/140  bone]
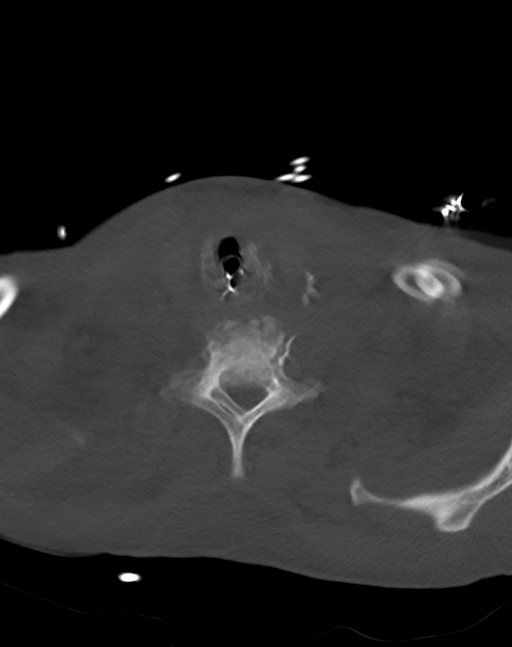
[im 84/140  bone]
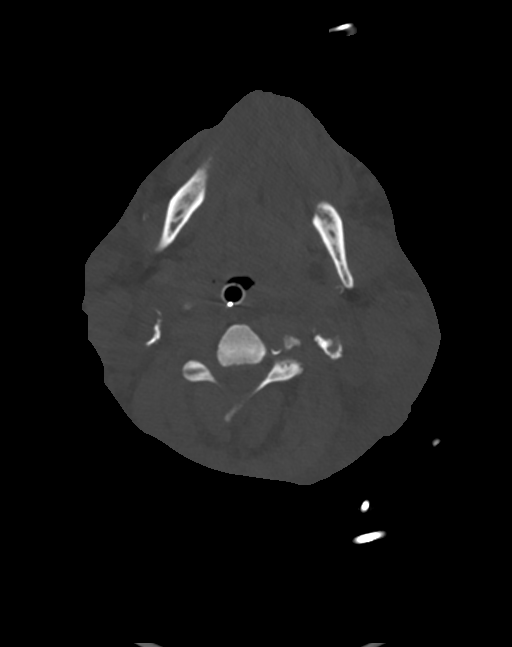
[im 112/140  bone]
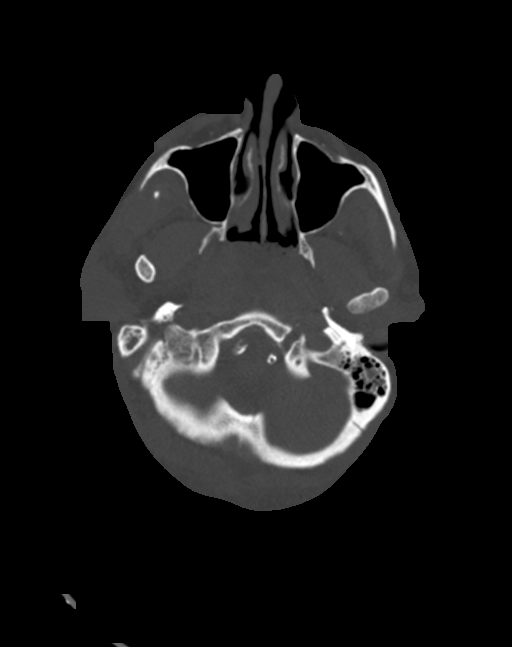

[14 of 35 positions shown; findings below may reference images not displayed]

FINDINGS: Pharynx and larynx: An endotracheal tube is present and terminates
at the level of the clavicular heads. There is motion artifact
through the larynx, improved with limited repeat imaging. No mass is
identified within limitations of noncontrast technique. Mild diffuse
edema suspected in the oral cavity and left greater than right
oropharynx. There is mild retropharyngeal edema and/or small
retropharyngeal effusion.

Salivary glands: No salivary stone or gross mass on this unenhanced
study.

Thyroid: Grossly unremarkable within limitations of motion artifact.

Lymph nodes: No grossly enlarged cervical lymph nodes within
limitations of noncontrast technique and diffuse edema.

Vascular: Widespread atherosclerotic vascular calcification.

Limited intracranial: Possible subcentimeter chronic infarct in the
right cerebellar hemisphere.

Visualized orbits: Unremarkable.

Mastoids and visualized paranasal sinuses: Right larger than left
mastoid effusions. Visualized paranasal sinuses are clear.

Skeleton: Diffusely increased density of the included skeletal
structures compatible with renal osteodystrophy. Moderate cervical
spondylosis.

Upper chest: Limited assessment due to motion. Emphysema and
dependent atelectasis bilaterally.

Other: Widespread soft tissue edema throughout the neck, overall
left greater than right. No organized fluid collection identified on
this unenhanced study.
IMPRESSION: Widespread edema throughout the soft tissues of the neck, left
greater than right. Oral cavity and likely mild oropharyngeal edema
with mild retropharyngeal edema or small retropharyngeal effusion.
These findings may reflect angioedema and generalized fluid overload
although infection/cellulitis is not excluded. No organized fluid
collection identified on this unenhanced study.

## 2021-06-28 IMAGING — DX DG CHEST 1V
1 series · 1 of 1 positions shown · non-contrast
Comparison: Portable chest [DATE].

CLINICAL DATA: 54-year-old male intubated.  Neck swelling.

EXAM:
CHEST  1 VIEW

[chest ap]
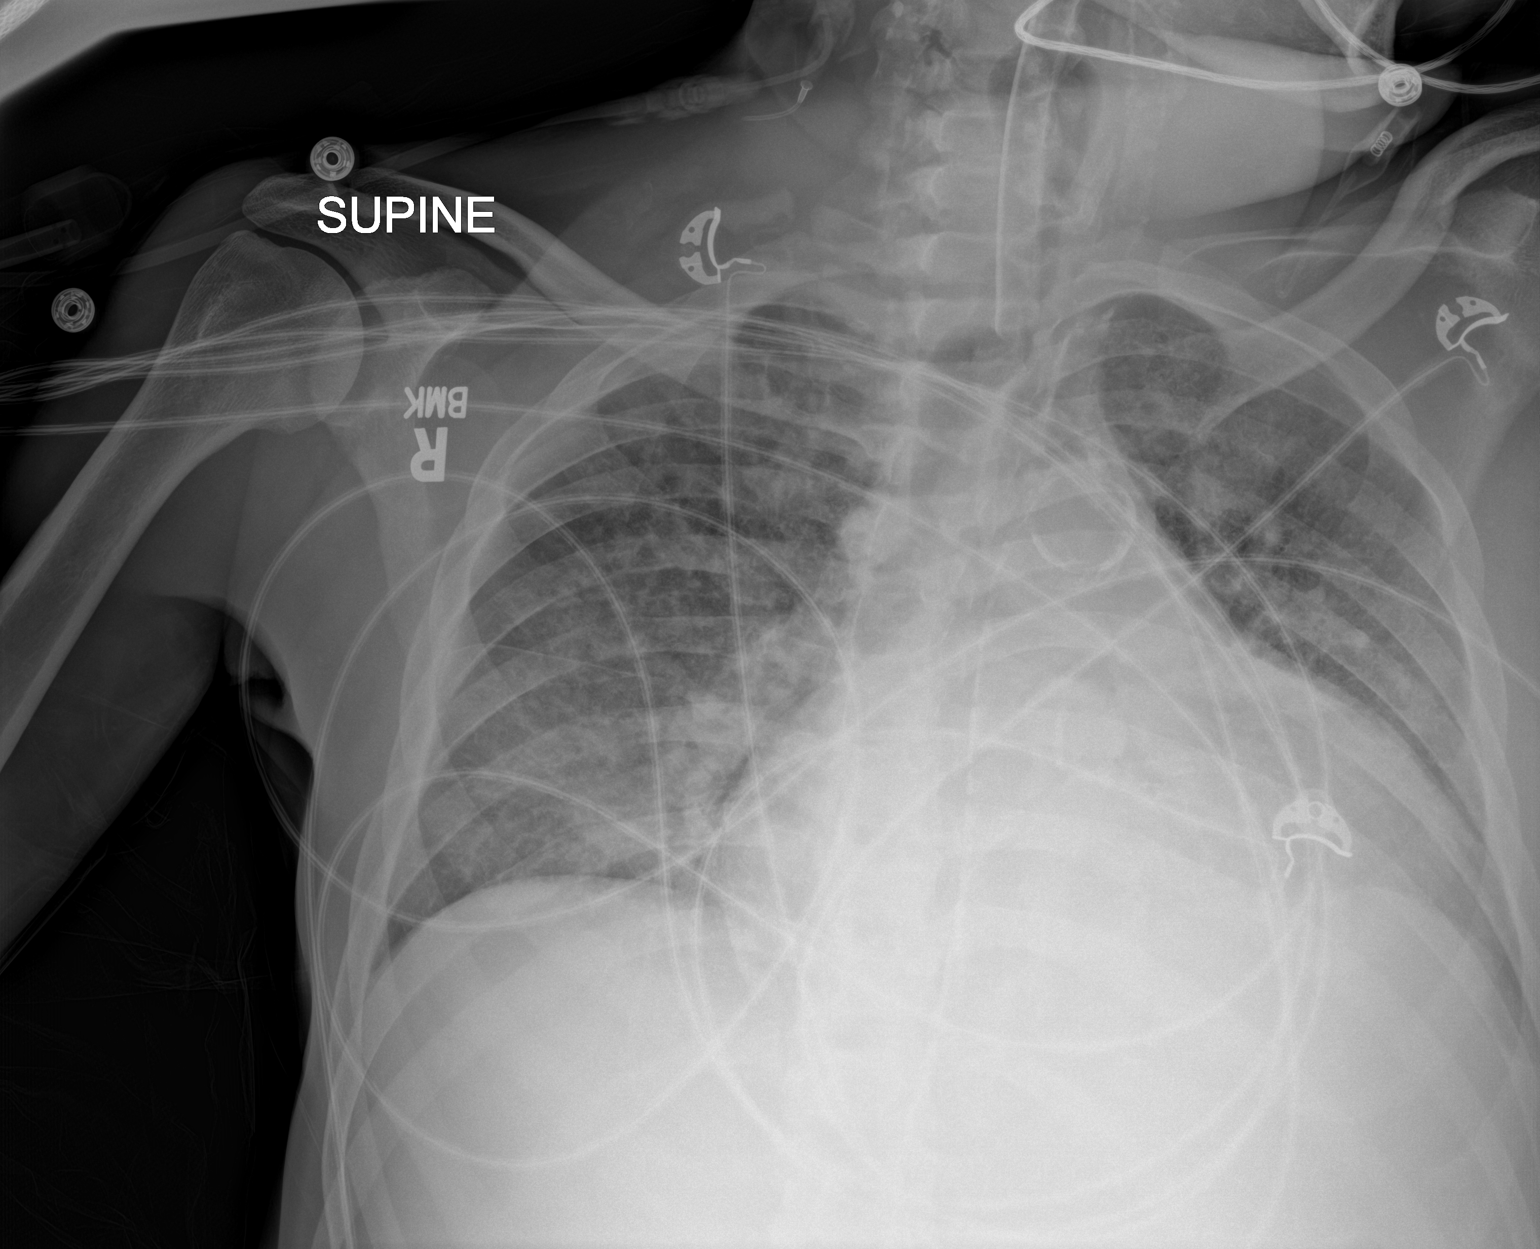

[1 of 1 positions shown; findings below may reference images not displayed]

FINDINGS: Portable AP supine view at [ZS] hours. Calcified aortic
atherosclerosis. Cardiomegaly. Other mediastinal contours are within
normal limits. Endotracheal tube tip projects just above the
clavicles over the trachea. A new right KB region intravenous access
is in place. No lower neck subcutaneous emphysema or other soft
tissue abnormality is identified.

Stable lung volumes. No pneumothorax. No definite pleural effusion
or consolidation. Widespread increased pulmonary interstitium has
mildly regressed since [DATE]. Paucity of bowel gas in the upper
abdomen. No acute osseous abnormality identified.
IMPRESSION: 1. ETT tip just above the clavicles, could be advanced 1-2 cm for
more optimal placement but otherwise satisfactory.
2. No neck subcutaneous emphysema or explanation for swelling. There
is a new superficial right KB type IV access in place.
3. Cardiomegaly and Aortic Atherosclerosis ([ZS]-[ZS]). Pulmonary
vascular congestion appears regressed since [DATE].
[DATE]. No new cardiopulmonary abnormality.

## 2021-06-28 MED ORDER — FAMOTIDINE IN NACL 20-0.9 MG/50ML-% IV SOLN: 20.0000 mg | INTRAVENOUS | Status: DC

## 2021-06-28 MED ORDER — SODIUM CHLORIDE 0.9 % IV SOLN
250.0000 mL | INTRAVENOUS | Status: DC
Start: 2021-06-28 — End: 2021-07-05

## 2021-06-28 MED ORDER — DIPHENHYDRAMINE HCL 50 MG/ML IJ SOLN
25.0000 mg | Freq: Four times a day (QID) | INTRAMUSCULAR | Status: DC
Start: 2021-06-28 — End: 2021-06-29
  Administered 2021-06-28 – 2021-06-29 (×3): 25 mg via INTRAVENOUS
  Filled 2021-06-28 (×4): qty 1

## 2021-06-28 MED ORDER — LIDOCAINE HCL (PF) 2 % IJ SOLN
INTRAMUSCULAR | Status: AC
  Filled 2021-06-28: qty 5

## 2021-06-28 MED ORDER — DEXAMETHASONE SODIUM PHOSPHATE 4 MG/ML IJ SOLN
4.0000 mg | Freq: Four times a day (QID) | INTRAMUSCULAR | Status: DC
  Administered 2021-06-28 – 2021-06-29 (×4): 4 mg via INTRAVENOUS
  Filled 2021-06-28 (×4): qty 1

## 2021-06-28 MED ORDER — PROPOFOL 1000 MG/100ML IV EMUL
INTRAVENOUS | Status: AC
  Administered 2021-06-28: 5 ug/kg/min via INTRAVENOUS
  Filled 2021-06-28: qty 100

## 2021-06-28 MED ORDER — PROPOFOL 1000 MG/100ML IV EMUL
5.0000 ug/kg/min | INTRAVENOUS | Status: DC
  Administered 2021-06-28 (×2): 40 ug/kg/min via INTRAVENOUS
  Administered 2021-06-28: 35 ug/kg/min via INTRAVENOUS
  Administered 2021-06-29: 40 ug/kg/min via INTRAVENOUS
  Filled 2021-06-28 (×5): qty 100

## 2021-06-28 MED ORDER — MIDAZOLAM HCL 5 MG/5ML IJ SOLN
INTRAMUSCULAR | Status: DC | PRN
  Administered 2021-06-28: 2 mg via INTRAVENOUS

## 2021-06-28 MED ORDER — FENTANYL CITRATE PF 50 MCG/ML IJ SOSY
50.0000 ug | PREFILLED_SYRINGE | Freq: Once | INTRAMUSCULAR | Status: AC
Start: 2021-06-28 — End: 2021-06-28

## 2021-06-28 MED ORDER — PROPOFOL 10 MG/ML IV BOLUS
INTRAVENOUS | Status: DC | PRN
  Administered 2021-06-28: 50 mg via INTRAVENOUS

## 2021-06-28 MED ORDER — ORAL CARE MOUTH RINSE
15.0000 mL | OROMUCOSAL | Status: DC
  Administered 2021-06-28 – 2021-06-29 (×13): 15 mL via OROMUCOSAL

## 2021-06-28 MED ORDER — CHLORHEXIDINE GLUCONATE 0.12% ORAL RINSE (MEDLINE KIT)
15.0000 mL | Freq: Two times a day (BID) | OROMUCOSAL | Status: DC
  Administered 2021-06-28 – 2021-06-29 (×3): 15 mL via OROMUCOSAL

## 2021-06-28 MED ORDER — FENTANYL 2500MCG IN NS 250ML (10MCG/ML) PREMIX INFUSION
50.0000 ug/h | INTRAVENOUS | Status: DC
  Administered 2021-06-28: 50 ug/h via INTRAVENOUS
  Filled 2021-06-28: qty 250

## 2021-06-28 MED ORDER — IPRATROPIUM-ALBUTEROL 0.5-2.5 (3) MG/3ML IN SOLN: 3.0000 mL | Freq: Four times a day (QID) | RESPIRATORY_TRACT | Status: DC | PRN

## 2021-06-28 MED ORDER — SODIUM CHLORIDE 0.9% IV SOLUTION: Freq: Once | INTRAVENOUS | Status: DC

## 2021-06-28 MED ORDER — ALBUTEROL SULFATE (2.5 MG/3ML) 0.083% IN NEBU: 2.5000 mg | INHALATION_SOLUTION | RESPIRATORY_TRACT | Status: DC | PRN

## 2021-06-28 MED ORDER — NOREPINEPHRINE 16 MG/250ML-% IV SOLN: 0.0000 ug/min | INTRAVENOUS | Status: DC

## 2021-06-28 MED ORDER — EPOETIN ALFA 10000 UNIT/ML IJ SOLN
10000.0000 [IU] | INTRAMUSCULAR | Status: DC
  Administered 2021-06-28 – 2021-07-03 (×2): 10000 [IU] via INTRAVENOUS
  Filled 2021-06-28: qty 1

## 2021-06-28 MED ORDER — MIDAZOLAM HCL 2 MG/2ML IJ SOLN
INTRAMUSCULAR | Status: AC
  Filled 2021-06-28: qty 2

## 2021-06-28 MED ORDER — FAMOTIDINE IN NACL 20-0.9 MG/50ML-% IV SOLN
20.0000 mg | Freq: Two times a day (BID) | INTRAVENOUS | Status: DC
  Administered 2021-06-28: 20 mg via INTRAVENOUS
  Filled 2021-06-28: qty 50

## 2021-06-28 MED ORDER — FENTANYL BOLUS VIA INFUSION
50.0000 ug | INTRAVENOUS | Status: DC | PRN
  Administered 2021-06-28: 50 ug via INTRAVENOUS
  Filled 2021-06-28: qty 100

## 2021-06-28 MED ORDER — DOCUSATE SODIUM 50 MG/5ML PO LIQD
100.0000 mg | Freq: Two times a day (BID) | ORAL | Status: DC
  Filled 2021-06-28: qty 10

## 2021-06-28 MED ORDER — NOREPINEPHRINE 4 MG/250ML-% IV SOLN
2.0000 ug/min | INTRAVENOUS | Status: DC
Start: 2021-06-28 — End: 2021-06-29
  Administered 2021-06-28: 2 ug/min via INTRAVENOUS
  Filled 2021-06-28: qty 250

## 2021-06-28 MED ORDER — PROPOFOL 10 MG/ML IV BOLUS
INTRAVENOUS | Status: AC
  Filled 2021-06-28: qty 20

## 2021-06-28 MED ORDER — LIDOCAINE HCL 4 % EX SOLN
CUTANEOUS | Status: DC | PRN
  Administered 2021-06-28: 2 mL via TOPICAL
  Administered 2021-06-28: 4 mL via TOPICAL

## 2021-06-28 MED ORDER — POLYETHYLENE GLYCOL 3350 17 G PO PACK: 17.0000 g | PACK | Freq: Every day | ORAL | Status: DC

## 2021-06-28 MED ORDER — IPRATROPIUM-ALBUTEROL 0.5-2.5 (3) MG/3ML IN SOLN
3.0000 mL | Freq: Four times a day (QID) | RESPIRATORY_TRACT | Status: DC
  Administered 2021-06-28 – 2021-07-02 (×12): 3 mL via RESPIRATORY_TRACT
  Filled 2021-06-28 (×14): qty 3

## 2021-06-28 NOTE — Progress Notes (Signed)
[facility]  ROUNDING NOTE   Subjective:   [name] was admitted to [facility] on 06/26/2021 for End stage renal disease ([place]) [N18.6] Atrial fibrillation with RVR ([place]) [I48.91] Placed on diltiazem drip.   Hemodialysis treatment on Wednesday. Patient refused extra treatment yesterday.   Patient with acute respiratory failure. Intubated and sedated. Hypotensive.    Objective:  Vital signs in last 24 hours:  Temp:  [96.3 F (35.7 C)-98.1 F (36.7 C)] 97.5 F (36.4 C) (10/07 0745) Pulse Rate:  [59-130] 94 (10/07 0147) Resp:  [9-25] 17 (10/07 0530) BP: (90-175)/(49-109) 99/69 (10/07 0745) SpO2:  [84 %-99 %] 94 % (10/07 0803) FiO2 (%):  [50 %-55 %] 55 % (10/07 0803) Weight:  [79 kg-82.5 kg] 79 kg (10/07 0500)  Weight change: 9.924 kg Filed Weights   06/26/21 0759 06/27/21 2020 06/28/21 0500  Weight: 72.6 kg 82.5 kg 79 kg    Intake/Output: I/O last 3 completed shifts: In: 0254 [P.O.:240; I.V.:1070.1; IV Piggyback:65.9] Out: -    Intake/Output this shift:  No intake/output data recorded.  Physical Exam: General: NAD, laying in bed  Head: Normocephalic, atraumatic. Moist oral mucosal membranes  Eyes: Anicteric, PERRL  Neck: Supple, trachea midline  Lungs:  Clear to auscultation  Heart: Irregular and tachycardia  Abdomen:  Soft, nontender  Extremities:  + peripheral edema.  Neurologic: Nonfocal, moving all four extremities  Skin: Multiple abrasions  Access: Left AVF    Basic Metabolic Panel: Recent Labs  Lab 06/26/21 0804 06/26/21 1725 06/27/21 0607 06/27/21 2129 06/28/21 0547  NA 145 139 143 140 137  K 6.1* 4.0 4.3 5.1 5.6*  CL 102 97* 99 97* 98  CO2 20* _0 GLUCOSE 95 93 116* 122* 129*  BUN 107* 72* 81* 88* 92*  CREATININE 18.65* 11.47* 12.40* 13.08* 12.90*  CALCIUM 9.4 9.2 8.9 8.6* 8.5*  MG 2.5*  --   --  2.2 2.0  PHOS 9.6*  --   --   --   --      Liver Function Tests: Recent Labs  Lab 06/26/21 0804  AST 20  ALT 14   ALKPHOS 102  BILITOT 1.6*  PROT 8.8*  ALBUMIN 3.9    No results for input(s): LIPASE, AMYLASE in the last 168 hours. No results for input(s): AMMONIA in the last 168 hours.  CBC: Recent Labs  Lab 06/26/21 0804 06/27/21 0607 06/27/21 2129 06/28/21 0547  WBC 9.6 8.1 8.8 5.4  NEUTROABS 7.6  --   --   --   HGB 7.1* 7.3* 7.3* 6.2*  HCT 23.0* 23.1* 24.1* 20.0*  MCV 101.8* 102.7* 100.4* 99.5  PLT 150 144* 150 116*     Cardiac Enzymes: No results for input(s): CKTOTAL, CKMB, CKMBINDEX, TROPONINI in the last 168 hours.  BNP: Invalid input(s): POCBNP  CBG: Recent Labs  Lab 06/27/21 2024 06/28/21 0722  GLUCAP 121* 124*    Microbiology: Results for orders placed or performed during the hospital encounter of 06/26/21  Resp Panel by RT-PCR (Flu A&B, Covid) Nasopharyngeal Swab     Status: None   Collection Time: 06/26/21  8:04 AM   Specimen: Nasopharyngeal Swab; Nasopharyngeal(NP) swabs in vial transport medium  Result Value Ref Range Status   SARS Coronavirus 2 by RT PCR NEGATIVE NEGATIVE Final    Comment: (NOTE) SARS-CoV-2 target nucleic acids are NOT DETECTED.  The SARS-CoV-2 RNA is generally detectable in upper respiratory specimens during the acute phase of infection. The lowest concentration of SARS-CoV-2  viral copies this assay can detect is 138 copies/mL. A negative result does not preclude SARS-Cov-2 infection and should not be used as the sole basis for treatment or other patient management decisions. A negative result may occur with  improper specimen collection/handling, submission of specimen other than nasopharyngeal swab, presence of viral mutation(s) within the areas targeted by this assay, and inadequate number of viral copies(<138 copies/mL). A negative result must be combined with clinical observations, patient history, and epidemiological information. The expected result is Negative.  Fact Sheet for Patients:   EntrepreneurPulse.com.au  Fact Sheet for Healthcare Providers:  IncredibleEmployment.be  This test is no t yet approved or cleared by the [place] FDA and  has been authorized for detection and/or diagnosis of SARS-CoV-2 by FDA under an Emergency Use Authorization (EUA). This EUA will remain  in effect (meaning this test can be used) for the duration of the COVID-19 declaration under Section 564(b)(1) of the Act, 21 U.S.C.section 360bbb-3(b)(1), unless the authorization is terminated  or revoked sooner.       Influenza A by PCR NEGATIVE NEGATIVE Final   Influenza B by PCR NEGATIVE NEGATIVE Final    Comment: (NOTE) The Xpert Xpress SARS-CoV-2/FLU/RSV plus assay is intended as an aid in the diagnosis of influenza from Nasopharyngeal swab specimens and should not be used as a sole basis for treatment. Nasal washings and aspirates are unacceptable for Xpert Xpress SARS-CoV-2/FLU/RSV testing.  Fact Sheet for Patients: EntrepreneurPulse.com.au  Fact Sheet for Healthcare Providers: IncredibleEmployment.be  This test is not yet approved or cleared by the [place] FDA and has been authorized for detection and/or diagnosis of SARS-CoV-2 by FDA under an Emergency Use Authorization (EUA). This EUA will remain in effect (meaning this test can be used) for the duration of the COVID-19 declaration under Section 564(b)(1) of the Act, 21 U.S.C. section 360bbb-3(b)(1), unless the authorization is terminated or revoked.  Performed at [facility], [address] [place], [place] [address]     Coagulation Studies: No results for input(s): LABPROT, INR in the last 72 hours.  Urinalysis: No results for input(s): COLORURINE, LABSPEC, PHURINE, GLUCOSEU, HGBUR, BILIRUBINUR, KETONESUR, PROTEINUR, UROBILINOGEN, NITRITE, LEUKOCYTESUR in the last 72 hours.  Invalid input(s): APPERANCEUR    Imaging: DG  Chest 1 View  Result Date: 06/28/2021 CLINICAL DATA:  55 year old male intubated.  Neck swelling. EXAM: CHEST  1 VIEW COMPARISON:  Portable chest 06/27/2021. FINDINGS: Portable AP supine view at 0412 hours. Calcified aortic atherosclerosis. Cardiomegaly. Other mediastinal contours are within normal limits. Endotracheal tube tip projects just above the clavicles over the trachea. A new right EJ region intravenous access is in place. No lower neck subcutaneous emphysema or other soft tissue abnormality is identified. Stable lung volumes. No pneumothorax. No definite pleural effusion or consolidation. Widespread increased pulmonary interstitium has mildly regressed since 06/26/2021. Paucity of bowel gas in the upper abdomen. No acute osseous abnormality identified. IMPRESSION: 1. ETT tip just above the clavicles, could be advanced 1-2 cm for more optimal placement but otherwise satisfactory. 2. No neck subcutaneous emphysema or explanation for swelling. There is a new superficial right EJ type IV access in place. 3. Cardiomegaly and Aortic Atherosclerosis (ICD10-I70.0). Pulmonary vascular congestion appears regressed since 06/26/2021. 4. No new cardiopulmonary abnormality. Electronically Signed   By: Genevie Ann M.D.   On: 06/28/2021 05:05   DG Chest 1 View  Result Date: 06/27/2021 CLINICAL DATA:  Anaphylactic reaction EXAM: CHEST  1 VIEW COMPARISON:  06/26/2021 FINDINGS: Cardiac size is moderately enlarged,  similar to prior examination and similar to remote prior examination of 04/26/2021. A component of this was related to an underlying pericardial effusion better appreciated on CT examination of 05/03/2021. Superimposed mild to moderate diffuse interstitial pulmonary edema has progressed slightly in the interval since prior examination. No pneumothorax or pleural effusion. Pulmonary insufflation is stable, the lung volumes are small. No acute bone abnormality. IMPRESSION: Stable cardiomegaly, a component which  likely relates to an underlying pericardial effusion as better seen on CT examination of 05/03/2021. Progressive mild to moderate pulmonary edema. Pulmonary hypoinflation. Electronically Signed   By: Fidela Salisbury M.D.   On: 06/27/2021 21:29   CT SOFT TISSUE NECK WO CONTRAST  Result Date: 06/28/2021 CLINICAL DATA:  Foreign body suspected, neck, neg xray. Left neck swelling. Clinical concern for angioedema. Renal failure on dialysis. EXAM: CT NECK WITHOUT CONTRAST TECHNIQUE: Multidetector CT imaging of the neck was performed following the standard protocol without intravenous contrast. COMPARISON:  01/14/2021 FINDINGS: Pharynx and larynx: An endotracheal tube is present and terminates at the level of the clavicular heads. There is motion artifact through the larynx, improved with limited repeat imaging. No mass is identified within limitations of noncontrast technique. Mild diffuse edema suspected in the oral cavity and left greater than right oropharynx. There is mild retropharyngeal edema and/or small retropharyngeal effusion. Salivary glands: No salivary stone or gross mass on this unenhanced study. Thyroid: Grossly unremarkable within limitations of motion artifact. Lymph nodes: No grossly enlarged cervical lymph nodes within limitations of noncontrast technique and diffuse edema. Vascular: Widespread atherosclerotic vascular calcification. Limited intracranial: Possible subcentimeter chronic infarct in the right cerebellar hemisphere. Visualized orbits: Unremarkable. Mastoids and visualized paranasal sinuses: Right larger than left mastoid effusions. Visualized paranasal sinuses are clear. Skeleton: Diffusely increased density of the included skeletal structures compatible with renal osteodystrophy. Moderate cervical spondylosis. Upper chest: Limited assessment due to motion. Emphysema and dependent atelectasis bilaterally. Other: Widespread soft tissue edema throughout the neck, overall left greater than  right. No organized fluid collection identified on this unenhanced study. IMPRESSION: Widespread edema throughout the soft tissues of the neck, left greater than right. Oral cavity and likely mild oropharyngeal edema with mild retropharyngeal edema or small retropharyngeal effusion. These findings may reflect angioedema and generalized fluid overload although infection/cellulitis is not excluded. No organized fluid collection identified on this unenhanced study. Electronically Signed   By: Logan Bores M.D.   On: 06/28/2021 08:23     Medications:    diltiazem (CARDIZEM) infusion Stopped (06/27/21 2059)   famotidine (PEPCID) IV     fentaNYL infusion INTRAVENOUS 50 mcg/hr (06/28/21 0724)   norepinephrine (LEVOPHED) Adult infusion     propofol (DIPRIVAN) infusion 40 mcg/kg/min (06/28/21 0600)    sodium chloride   Intravenous Once   chlorhexidine gluconate (MEDLINE KIT)  15 mL Mouth Rinse BID   Chlorhexidine Gluconate Cloth  6 each Topical Q0600   dexamethasone (DECADRON) injection  4 mg Intravenous Q6H   diphenhydrAMINE  25 mg Intravenous Q6H   docusate  100 mg Per Tube BID   ferrous sulfate  325 mg Oral BID WC   furosemide  40 mg Intravenous Q12H   mouth rinse  15 mL Mouth Rinse 10 times per day   polyethylene glycol  17 g Per Tube Daily     Assessment/ Plan:  [name] is a 55 y.o. black male with end stage renal disease on hemodialysis, hypertension, COPD, EtOH abuse, atrial fibrillation, congestive heart failure who is admitted to [facility] on  End stage renal disease ([place]) [N18.6] Atrial fibrillation with RVR ([place]) [I48.91]   [facility] MWF [facility] Left AVF 67kg  End Stage Renal Disease: hyperkalemia, pulmonary edema and volume overload on admission.  - emergent hemodialysis treatment this morning. UF goal for 2-3  - start norepinephrine  Hypotension: with hypertensive urgency and atrial fibrillation with rapid ventricular response on admission. Discontinued  hydralazine and IV furosemide.  - Continue diltiazem gtt - Appreciate cardiology input.   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   Anemia with chronic kidney disease: hemoglobin 6.2. Not getting outpatient ESA. Iron studies from 9/12 are acceptable.  - PRBC transfusion scheduled.  - ESA with HD treatment   LOS: 2   10/7/20228:49 AM

## 2021-06-28 NOTE — Progress Notes (Signed)
Pt transported to CT and returned to ICU 2 on the ventilator without incident. Pt remains on the vent and is tol well.

## 2021-06-28 NOTE — Progress Notes (Signed)
[facility]    SUBJECTIVE: Intubated sedated respiratory failure   Vitals:   06/28/21 2130 06/28/21 2145 06/28/21 2200 06/28/21 2215  BP: 107/72 106/71 106/72 108/73  Pulse: 71 71 71 72  Resp: '18 18 18 18  '$ Temp:      TempSrc:      SpO2: 97% 97% 97% 98%  Weight:      Height:         Intake/Output Summary (Last 24 hours) at 06/28/2021 2247 Last data filed at 06/28/2021 2200 Gross per 24 hour  Intake 1707.67 ml  Output 1000 ml  Net 707.67 ml      PHYSICAL EXAM  General: Well developed, well nourished, in no acute distress HEENT:  Normocephalic and atramatic Neck:  No JVD.  Lungs: Clear bilaterally to auscultation and percussion. Heart: HRRR . Normal S1 and S2 without gallops or murmurs.  Abdomen: Bowel sounds are positive, abdomen soft and non-tender  Msk:  Back normal, normal gait. Normal strength and tone for age. Extremities: No clubbing, cyanosis or edema.   Neuro: Alert and oriented X 3. Psych:  Good affect, responds appropriately   LABS: Basic Metabolic Panel: Recent Labs    06/26/21 0804 06/26/21 1725 06/27/21 2129 06/28/21 0547 06/28/21 1555  NA 145   < > 140 137 136  K 6.1*   < > 5.1 5.6* 4.5  CL 102   < > 97* 98 94*  CO2 20*   < > '23 23 26  '$ GLUCOSE 95   < > 122* 129* 107*  BUN 107*   < > 88* 92* 55*  CREATININE 18.65*   < > 13.08* 12.90* 8.49*  CALCIUM 9.4   < > 8.6* 8.5* 8.4*  MG 2.5*  --  2.2 2.0  --   PHOS 9.6*  --   --   --   --    < > = values in this interval not displayed.   Liver Function Tests: Recent Labs    06/26/21 0804  AST 20  ALT 14  ALKPHOS 102  BILITOT 1.6*  PROT 8.8*  ALBUMIN 3.9   No results for input(s): LIPASE, AMYLASE in the last 72 hours. CBC: Recent Labs    06/26/21 0804 06/27/21 0607 06/27/21 2129 06/28/21 0547 06/28/21 1555  WBC 9.6   < > 8.8 5.4  --   NEUTROABS 7.6  --   --   --   --   HGB 7.1*   < > 7.3* 6.2* 7.6*  HCT 23.0*   < > 24.1* 20.0* 24.3*  MCV 101.8*   < > 100.4* 99.5  --   PLT 150    < > 150 116*  --    < > = values in this interval not displayed.   Cardiac Enzymes: No results for input(s): CKTOTAL, CKMB, CKMBINDEX, TROPONINI in the last 72 hours. BNP: Invalid input(s): POCBNP D-Dimer: No results for input(s): DDIMER in the last 72 hours. Hemoglobin A1C: No results for input(s): HGBA1C in the last 72 hours. Fasting Lipid Panel: No results for input(s): CHOL, HDL, LDLCALC, TRIG, CHOLHDL, LDLDIRECT in the last 72 hours. Thyroid Function Tests: No results for input(s): TSH, T4TOTAL, T3FREE, THYROIDAB in the last 72 hours.  Invalid input(s): FREET3 Anemia Panel: No results for input(s): VITAMINB12, FOLATE, FERRITIN, TIBC, IRON, RETICCTPCT in the last 72 hours.  DG Chest 1 View  Result Date: 06/28/2021 CLINICAL DATA:  55 year old male intubated.  Neck swelling. EXAM: CHEST  1 VIEW COMPARISON:  Portable chest 06/27/2021.  FINDINGS: Portable AP supine view at 0412 hours. Calcified aortic atherosclerosis. Cardiomegaly. Other mediastinal contours are within normal limits. Endotracheal tube tip projects just above the clavicles over the trachea. A new right EJ region intravenous access is in place. No lower neck subcutaneous emphysema or other soft tissue abnormality is identified. Stable lung volumes. No pneumothorax. No definite pleural effusion or consolidation. Widespread increased pulmonary interstitium has mildly regressed since 06/26/2021. Paucity of bowel gas in the upper abdomen. No acute osseous abnormality identified. IMPRESSION: 1. ETT tip just above the clavicles, could be advanced 1-2 cm for more optimal placement but otherwise satisfactory. 2. No neck subcutaneous emphysema or explanation for swelling. There is a new superficial right EJ type IV access in place. 3. Cardiomegaly and Aortic Atherosclerosis (ICD10-I70.0). Pulmonary vascular congestion appears regressed since 06/26/2021. 4. No new cardiopulmonary abnormality. Electronically Signed   By: Genevie Ann M.D.   On:  06/28/2021 05:05   DG Chest 1 View  Result Date: 06/27/2021 CLINICAL DATA:  Anaphylactic reaction EXAM: CHEST  1 VIEW COMPARISON:  06/26/2021 FINDINGS: Cardiac size is moderately enlarged, similar to prior examination and similar to remote prior examination of 04/26/2021. A component of this was related to an underlying pericardial effusion better appreciated on CT examination of 05/03/2021. Superimposed mild to moderate diffuse interstitial pulmonary edema has progressed slightly in the interval since prior examination. No pneumothorax or pleural effusion. Pulmonary insufflation is stable, the lung volumes are small. No acute bone abnormality. IMPRESSION: Stable cardiomegaly, a component which likely relates to an underlying pericardial effusion as better seen on CT examination of 05/03/2021. Progressive mild to moderate pulmonary edema. Pulmonary hypoinflation. Electronically Signed   By: Fidela Salisbury M.D.   On: 06/27/2021 21:29   CT SOFT TISSUE NECK WO CONTRAST  Result Date: 06/28/2021 CLINICAL DATA:  Foreign body suspected, neck, neg xray. Left neck swelling. Clinical concern for angioedema. Renal failure on dialysis. EXAM: CT NECK WITHOUT CONTRAST TECHNIQUE: Multidetector CT imaging of the neck was performed following the standard protocol without intravenous contrast. COMPARISON:  01/14/2021 FINDINGS: Pharynx and larynx: An endotracheal tube is present and terminates at the level of the clavicular heads. There is motion artifact through the larynx, improved with limited repeat imaging. No mass is identified within limitations of noncontrast technique. Mild diffuse edema suspected in the oral cavity and left greater than right oropharynx. There is mild retropharyngeal edema and/or small retropharyngeal effusion. Salivary glands: No salivary stone or gross mass on this unenhanced study. Thyroid: Grossly unremarkable within limitations of motion artifact. Lymph nodes: No grossly enlarged cervical lymph  nodes within limitations of noncontrast technique and diffuse edema. Vascular: Widespread atherosclerotic vascular calcification. Limited intracranial: Possible subcentimeter chronic infarct in the right cerebellar hemisphere. Visualized orbits: Unremarkable. Mastoids and visualized paranasal sinuses: Right larger than left mastoid effusions. Visualized paranasal sinuses are clear. Skeleton: Diffusely increased density of the included skeletal structures compatible with renal osteodystrophy. Moderate cervical spondylosis. Upper chest: Limited assessment due to motion. Emphysema and dependent atelectasis bilaterally. Other: Widespread soft tissue edema throughout the neck, overall left greater than right. No organized fluid collection identified on this unenhanced study. IMPRESSION: Widespread edema throughout the soft tissues of the neck, left greater than right. Oral cavity and likely mild oropharyngeal edema with mild retropharyngeal edema or small retropharyngeal effusion. These findings may reflect angioedema and generalized fluid overload although infection/cellulitis is not excluded. No organized fluid collection identified on this unenhanced study. Electronically Signed   By: Logan Bores M.D.   On:  06/28/2021 08:23     Echo pending  TELEMETRY: Atrial fibrillation rapid ventricular response:  ASSESSMENT AND PLAN:  Active Problems:   Atrial fibrillation with RVR (HCC) Respiratory failure Hypertension COPD Pericardial effusion Swelling possibly allergic reaction Volume overload A. fib RVR History of alcohol abuse  Plan Continue respiratory support wean from vent when able Rate control for atrial fibrillation Anticoagulation for atrial fibrillation Continue medical therapy aggressively Alcohol abuse for possible withdrawal causing acute metabolic encephalopathy We agree with echocardiogram for evaluation No invasive procedures recommended at this point     Yolonda Kida,  MD 06/28/2021 10:47 PM

## 2021-06-28 NOTE — Progress Notes (Signed)
ETT advanced 2 cm and secured 24 cm at the lip.

## 2021-06-28 NOTE — Plan of Care (Signed)

## 2021-06-28 NOTE — Progress Notes (Signed)
Patient completed dialysis treatment as ordered. 1 Liter removed during treatment. Patient also received 1 unit of PRBC during dialysis. No adverse effects noted or reported. Patient tolerated treatment well. Report given to Whitney Post, RN.

## 2021-06-28 NOTE — Progress Notes (Addendum)
GOALS OF CARE     Advance Care Planning/Goals of Care discussion was performed during the course of treatment to decide on type of care right for this patient following admission to the ICU.   I discussed with patients' next of kin Jordan [name] and Jordan [name] who both confirmed patient's wishes to only intubate for airway protection and stabilization of current medical conditions. He otherwise would not wish to be resuscitated in the event of cardiac arrest including CPR, Defibrillation or life sustaining measures/ACLS drugs.    Discussed prognosis, expected outcome with or without ongoing aggressive treatments and the options for de-escalation of care.   Diagnosis(es): Hypersensitivity reaction with associated orolingual angioedema Prognosis: Guarded Code Status: Partial Code, Intubation only for reversible causes Disposition: ICU Next Steps: Family and patient understands the situation. They have consented and agreed to Intubation only and would not wish to pursue any aggressive treatment beyond the current interventions.  Family are satisfied with Plan of action and management. All questions answered     Total Time Spent Face to Face addressing advance care planning in the presence of the Patient: 25 minutes       Jordan Falco, DNP, FNP-C, AGACNP-BC Acute Care Nurse Practitioner [facility] Pager: [phone] [place] [facility]

## 2021-06-28 NOTE — Anesthesia Procedure Notes (Addendum)
Procedure Name: Intubation Date/Time: 06/28/2021 3:33 AM Performed by: Iran Ouch, MD Pre-anesthesia Checklist: Patient identified, Emergency Drugs available, Suction available and Patient being monitored Patient Re-evaluated:Patient Re-evaluated prior to induction Oxygen Delivery Method: Circle system utilized Preoxygenation: Pre-oxygenation with 100% oxygen Laryngoscope Size: 4 and Glidescope Tube type: Oral Tube size: 7.5 mm Number of attempts: 1 Airway Equipment and Method: Stylet and Oral airway Placement Confirmation: ETT inserted through vocal cords under direct vision, positive ETCO2 and breath sounds checked- equal and bilateral Secured at: 22 cm Tube secured with: Tape Dental Injury: Teeth and Oropharynx as per pre-operative assessment  Difficulty Due To: Difficulty was anticipated Future Recommendations: Recommend- induction with short-acting agent, and alternative techniques readily available Comments: Pt was in respiratory failure with tachypnea and hypoxia due to pulmonary edema and possible laryngeal edema due to volume overload. He agreed to an awake intubation. The pharynx and larynx were topicalized with mucosal lidocaine. A transtracheal block was attempted with and without ultrasound but aborted both times due to inability to access trachea. Pt's IV was checked prior to induction and possibly infiltrated once propofol was injected at the time of vocal cord access. The patient was in distress but he appeared as if his larynx was numbed adequately. Midazolam was administered as soon as an IV was placed. No complications otherwise.

## 2021-06-28 NOTE — Progress Notes (Signed)
Noted respiratory decompensation on 10/6.  Etiology unclear.  Patient with acute onset neck and oral cavity swelling, suspicious for anaphylaxis.  Emergently intubated for airway protection.  Remains on ventilator this AM.  Discussed with PCCM attending.  [facility] hospitalist service to sign off at this time.  Please call our service when patient stable for transfer to medical floor  Ralene Muskrat MD

## 2021-06-28 NOTE — Progress Notes (Signed)
NAME:  Jordan [name], MRN:  [id number], DOB:  03/13/1966, LOS: 2 ADMISSION DATE:  06/26/2021, CONSULTATION DATE: 06/27/2021 REFERRING MD: Sharion Settler, NP, CHIEF COMPLAINT: Shortness of breath   HPI  55 y.o with significant PMH of ESRD on HD MWF although noncompliant with HD, HTN, CHF, atrial fibrillation and COPD who presented to the ED with chief complaints of acute onset worsening dyspnea, chest discomfort, and abdominal distention after missing multiple dialysis session due to being incarcerated for 1 week.  ED Course: On arrival to the ED, he was afebrile with blood pressure 153/108 mm Hg and pulse rate 95 beats/min, oxygen saturation 100% on room air.  He was noted with rales and rhonchi to bilateral lungs with abdominal distention consistent with edema/ascites.  Due to concerns of fluid overload related to missed hemodialysis chest x-ray was obtained which showed pulmonary edema and concern for possible Pericardial effusion.  Labs significant for potassium of 6.1.  Patient received calcium gluconate, insulin and D50.  Nephrology was consulted who advised the patient to be dialyzed and admitted for further management.  Following completion of his hemodialysis, patient went into A. fib with RVR and was started on diltiazem drip for rate control.  Patient was admitted under hospitalist service.  Hospital Course: During the course of admission, patient remained on Dilts drip for A. fib with RVR.  On 10/6 at around 8 PM rapid response called for signs of anaphylactic reaction.  Patient noted with orolingual angioedema without associated Hypoxemia, tachypnea, rash/hives, hypotension or bronchospasm/wheezing. On arrival to the ICU, Physical examination revealed an edematous tongue and asymmetric edema in the neck right>left with increased respiratory distress and stridor. He was treated with racemic epinephrine via nebulizer, IM epinephrine 0.3 mg, Benadryl 50 mg IV, Pepcid 20 mg IV, and Solu-Medrol  125 mg IV with immediate response. Detailed enquiry did not reveal anything ingested, contacted, inhaled or otherwise exposed to prior to incident. Patient continued to protect his airway and therefore did not require immediate airway management. PCCM consulted.  06/28/21- patient is imrpoved post HD, tongue is 50% less swollen pos dialysis today.   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 fib with RVR 10/6: Rapid response called for anaphylactic/hypersensitivity reaction.  Patient transferred to the ICU.  PCCM consulted 10/7- patient on vent 70% and levophed, orolingual swelling improved post renal replacement therapy  Consults:  PCCM Nephrology  Procedures:  none  Significant Diagnostic Tests:  10/6: Chest Xray>Progressive mild to moderate pulmonary edema with underlying pericardial effusion   Micro Data:  10/5: SARS-CoV-2 PCR> negative 10/5: Influenza PCR> negative  Antimicrobials:  None  OBJECTIVE  Blood pressure 99/69, pulse 94, temperature (!) 97.5 F (36.4 C), temperature source Oral, resp. rate 17, height 5' 4" (1.626 m), weight 79 kg, SpO2 94 %.    Vent Mode: PRVC FiO2 (%):  [50 %-55 %] 55 % Set Rate:  [18 bmp] 18 bmp Vt Set:  [480 mL] 480 mL PEEP:  [5 cmH20-8 cmH20] 8 cmH20 Plateau Pressure:  [22 cmH20] 22 cmH20   Intake/Output Summary (Last 24 hours) at 06/28/2021 0930 Last data filed at 06/28/2021 0600 Gross per 24 hour  Intake 1376.02 ml  Output --  Net 1376.02 ml  Filed Weights   06/27/21 2020 06/28/21 0500 06/28/21 0920  Weight: 82.5 kg 79 kg 79 kg   Physical Examination  GENERAL: 55  year-old critically ill patient lying in the bed in moderate respiratory distress.  EYES: Pupils equal, round, reactive to light and accommodation. No scleral icterus. Extraocular muscles intact.  HEENT: Head atraumatic, normocephalic. Orolingual angioedema noted  NECK:  Supple, moderate jugular venous distention. Neck swelling bilaterally left>right No thyroid enlargement, no tenderness.  LUNGS: Decreased breath sounds bilaterally, mild to moderate wheezing, rales,rhonchi.  No crepitation. mild use of accessory muscles of respiration.  CARDIOVASCULAR: S1, S2 normal. No murmurs, rubs, or gallops.  ABDOMEN: Markedly distended abdomen/ascites. Bowel sounds present. No organomegaly or mass.  EXTREMITIES: Generalized edema, cyanosis, or clubbing.  NEUROLOGIC: Cranial nerves II through XII are intact.  Moving all extremities. Sensation intact. Gait not checked.  PSYCHIATRIC: The patient is alert and oriented to self SKIN: No obvious rash, lesion, or ulcer.   Labs/imaging that I havepersonally reviewed  (right click and "Reselect all SmartList Selections" daily)     Labs   CBC: Recent Labs  Lab 06/26/21 0804 06/27/21 0607 06/27/21 2129 06/28/21 0547  WBC 9.6 8.1 8.8 5.4  NEUTROABS 7.6  --   --   --   HGB 7.1* 7.3* 7.3* 6.2*  HCT 23.0* 23.1* 24.1* 20.0*  MCV 101.8* 102.7* 100.4* 99.5  PLT 150 144* 150 116*     Basic Metabolic Panel: Recent Labs  Lab 06/26/21 0804 06/26/21 1725 06/27/21 0607 06/27/21 2129 06/28/21 0547  NA 145 139 143 140 137  K 6.1* 4.0 4.3 5.1 5.6*  CL 102 97* 99 97* 98  CO2 20* _0 GLUCOSE 95 93 116* 122* 129*  BUN 107* 72* 81* 88* 92*  CREATININE 18.65* 11.47* 12.40* 13.08* 12.90*  CALCIUM 9.4 9.2 8.9 8.6* 8.5*  MG 2.5*  --   --  2.2 2.0  PHOS 9.6*  --   --   --   --     GFR: Estimated Creatinine Clearance: 6.3 mL/min (A) (by C-G formula based on SCr of 12.9 mg/dL (H)). Recent Labs  Lab 06/26/21 0804 06/27/21 0607 06/27/21 2129  06/28/21 0547  WBC 9.6 8.1 8.8 5.4  LATICACIDVEN 1.6  --   --   --      Liver Function Tests: Recent Labs  Lab 06/26/21 0804  AST 20  ALT 14  ALKPHOS 102  BILITOT 1.6*  PROT 8.8*  ALBUMIN 3.9    No results for input(s): LIPASE, AMYLASE in the last 168 hours. No results for input(s): AMMONIA in the last 168 hours.  ABG    Component Value Date/Time   PHART 7.37 06/28/2021 0437   PCO2ART 41 06/28/2021 0437   PO2ART 83 06/28/2021 0437   HCO3 23.7 06/28/2021 0437   TCO2 19 (L) 06/02/2021 1152   ACIDBASEDEF 1.5 06/28/2021 0437   O2SAT 95.8 06/28/2021 0437      Coagulation Profile: No results for input(s): INR, PROTIME in the last 168 hours.  Cardiac Enzymes: No results for input(s): CKTOTAL, CKMB, CKMBINDEX, TROPONINI in the last 168 hours.  HbA1C: Hgb A1c MFr Bld  Date/Time Value Ref Range Status  04/07/2020 11:59 AM 5.0 4.8 - 5.6 % Final    Comment:    (NOTE) Pre diabetes:          5.7%-6.4%  Diabetes:              >6.4%  Glycemic control for   <  7.0% adults with diabetes   06/23/2019 08:40 AM 6.3 4.6 - 6.5 % Final    Comment:    Glycemic Control Guidelines for People with Diabetes:Non Diabetic:  <6%Goal of Therapy: <7%Additional Action Suggested:  >8%     CBG: Recent Labs  Lab 06/27/21 2024 06/28/21 0722  GLUCAP 121* 124*     Review of Systems:   UNABLE TO OBTAIN DUE TO PATIENT IN RESPIRATORY DISTRESS  Past Medical History  He,  has a past medical history of Anemia, Aortic atherosclerosis ([place]) (11/12/2019), ED (erectile dysfunction), Emphysema of lung ([place]) (11/12/2019), ESRD on hemodialysis ([place]), ETOH abuse, History of blood transfusion, Hypertension, and Wears dentures.   Surgical History    Past Surgical History:  Procedure Laterality Date   BASCILIC VEIN TRANSPOSITION Left 08/07/2016   Procedure: LEFT BASILIC VEIN TRANSPOSITION;  Surgeon: Angelia Mould, MD;  Location: [place];  Service: Vascular;  Laterality: Left;   CLOSED  REDUCTION NASAL FRACTURE N/A 08/11/2019   Procedure: CLOSED REDUCTION NASAL FRACTURE WITH STABILIZATION;  Surgeon: Irene Limbo, MD;  Location: [place];  Service: Plastics;  Laterality: N/A;   COLONOSCOPY N/A 08/12/2016   Procedure: COLONOSCOPY;  Surgeon: Otis Brace, MD;  Location: [place];  Service: Gastroenterology;  Laterality: N/A;   COLONOSCOPY WITH PROPOFOL N/A 06/05/2021   Procedure: COLONOSCOPY WITH PROPOFOL;  Surgeon: Sharyn Creamer, MD;  Location: [place];  Service: Gastroenterology;  Laterality: N/A;   ESOPHAGOGASTRODUODENOSCOPY N/A 08/12/2016   Procedure: ESOPHAGOGASTRODUODENOSCOPY (EGD);  Surgeon: Otis Brace, MD;  Location: [place];  Service: Gastroenterology;  Laterality: N/A;   ESOPHAGOGASTRODUODENOSCOPY (EGD) WITH PROPOFOL N/A 06/05/2021   Procedure: ESOPHAGOGASTRODUODENOSCOPY (EGD) WITH PROPOFOL;  Surgeon: Sharyn Creamer, MD;  Location: [place];  Service: Gastroenterology;  Laterality: N/A;   INSERTION OF DIALYSIS CATHETER N/A 08/07/2016   Procedure: INSERTION OF TUNNELED DIALYSIS CATHETER;  Surgeon: Angelia Mould, MD;  Location: [place];  Service: Vascular;  Laterality: N/A;   LIGATION OF ARTERIOVENOUS  FISTULA Left 09/12/2016   Procedure: BANDING OF LEFT  ARTERIOVENOUS  FISTULA;  Surgeon: Angelia Mould, MD;  Location: [place];  Service: Vascular;  Laterality: Left;   LOWER EXTREMITY INTERVENTION Right 12/02/2018   Procedure: LOWER EXTREMITY INTERVENTION;  Surgeon: Algernon Huxley, MD;  Location: [facility];  Service: Cardiovascular;  Laterality: Right;   POLYPECTOMY  06/05/2021   Procedure: POLYPECTOMY;  Surgeon: Sharyn Creamer, MD;  Location: [facility];  Service: Gastroenterology;;     Social History   reports that he has been smoking cigarettes. He has a 20.00 pack-year smoking history. He has never used smokeless tobacco. He reports that he does not currently use alcohol after a past usage of about 21.0 standard drinks  per week. He reports that he does not use drugs.   Family History   His family history includes Diabetes in his mother; Healthy in his father and sister; Kidney disease in his daughter; Kidney failure in his brother and mother. There is no history of Post-traumatic stress disorder, Bladder Cancer, or Kidney cancer.   Allergies Allergies  Allergen Reactions   Lisinopril Swelling     Home Medications  Prior to Admission medications   Medication Sig Start Date End Date Taking? Authorizing Provider  albuterol (VENTOLIN HFA) 108 (90 Base) MCG/ACT inhaler Inhale 2 puffs into the lungs every 4 (four) hours as needed for wheezing or shortness of breath. 06/05/21  Yes Danford, Suann Larry, MD  calcium acetate (PHOSLO) 667 MG capsule Take 2 capsules (1,334  mg total) by mouth 3 (three) times daily with meals. 06/05/21  Yes Danford, Suann Larry, MD  cinacalcet (SENSIPAR) 30 MG tablet Take 1 tablet (30 mg total) by mouth daily with breakfast. 06/05/21  Yes Danford, Suann Larry, MD  docusate sodium (COLACE) 100 MG capsule Take 1 capsule (100 mg total) by mouth 2 (two) times daily. 06/05/21  Yes Danford, Suann Larry, MD  epoetin alfa (EPOGEN) 4000 UNIT/ML injection Inject 1 mL (4,000 Units total) into the vein every Monday, Wednesday, and Friday with hemodialysis. 06/22/20  Yes Enzo Bi, MD  ferrous sulfate 325 (65 FE) MG tablet Take 1 tablet (325 mg total) by mouth 2 (two) times daily with a meal. 06/05/21  Yes Danford, Suann Larry, MD  hydrALAZINE (APRESOLINE) 50 MG tablet Take 1 tablet (50 mg total) by mouth 3 (three) times daily. 06/05/21  Yes Danford, Suann Larry, MD  pantoprazole (PROTONIX) 20 MG tablet Take 1 tablet (20 mg total) by mouth 2 (two) times daily before a meal. 06/05/21 08/04/21 Yes Danford, Suann Larry, MD    Scheduled Meds:  sodium chloride   Intravenous Once   chlorhexidine gluconate (MEDLINE KIT)  15 mL Mouth Rinse BID   Chlorhexidine Gluconate Cloth  6 each Topical Q0600    dexamethasone (DECADRON) injection  4 mg Intravenous Q6H   diphenhydrAMINE  25 mg Intravenous Q6H   docusate  100 mg Per Tube BID   epoetin (EPOGEN/PROCRIT) injection  10,000 Units Intravenous Q M,W,F-HD   ferrous sulfate  325 mg Oral BID WC   furosemide  40 mg Intravenous Q12H   mouth rinse  15 mL Mouth Rinse 10 times per day   polyethylene glycol  17 g Per Tube Daily   Continuous Infusions:  sodium chloride     famotidine (PEPCID) IV     fentaNYL infusion INTRAVENOUS 50 mcg/hr (06/28/21 0724)   norepinephrine (LEVOPHED) Adult infusion 2 mcg/min (06/28/21 0911)   propofol (DIPRIVAN) infusion 40 mcg/kg/min (06/28/21 0600)   PRN Meds:.acetaminophen **OR** acetaminophen, fentaNYL, ipratropium-albuterol, magnesium hydroxide  Assessment & Plan:  Acute on Chronic respiratory failure with hypoxia due to pulmonary edema from hypervolemia exacerbated by hypersensitivity reaction  Hx: COPD, Emphysema, tobacco abuse. -Supplemental O2 as needed to maintain O2 saturations 88 to 92% -Follow intermittent ABG and chest x-ray as needed -Repeat CXR on  -IV Lasix as blood pressure and renal function permits; currently on Lasix 40 mg IV BID -As needed bronchodilators -Encourage smoking cessation   Airway compromise with Oroligual swelling  -Possible due to anasarca from missed HD vs allergic anaphylactic reaction.     - allergen profile and C1 esterase deficiency Status post racemic epinephrine via nebulizer, IM epinephrine 0.3 mg, Benadryl 50 mg IV, Pepcid 20 mg IV, and Solu-Medrol 125 mg IV x 1 dose -CT reviewed - Swelling soft tt + -improved post HD  Acute on chronic combined CHF Pericardial effusion likely in setting of uremia Hx of HTN Volume overload in the setting of missed HD s/p HD on admission 10/5 -Continuous cardiac monitoring -IV Lasix as blood pressure and renal function permits; currently on Lasix 40 mg IV BID -Hold BP meds in the setting of hypersensitivity reaction with  potential hypotension -Cardiology consult -Repeat 2D Echocardiogram  Afib+RVR  -Continue Diltiazem 0.28m/kg (171m IV x1 then maintain 5-15mg/hr drip -Consider weaning to oral Diltiazem 30-9047mO qid while titrating dose, then transition to SA daily formulation  # Thromboembolism Risk Management, likely not a candidate for anticoagulation given noncompliance -Cardiology consult  ESRD on  HD MWF Metabolic bone disease -Monitor I&O's / urinary output -Follow BMP -Ensure adequate renal perfusion -Replace electrolytes as indicated -Renal diet -Renally dose meds -Intermittent HD per renal- planning for tomorrow  Acute metabolic encephalopathy 2nd to hypoxia, uremia. ETOH Abuse high risk for withdrawal. -may need precedex with RASS goal 0 to -1 -Monitor CMP, INR, Daily BMP+Mg -CIWA symptom triggered PRNs -Daily Thiamine, Folate, MVI once tolerating PO -Insomnia Options: Trazodone if needed -SW consult for cessation resources -PT/OT evaluation for mobility  Anemia of critical illness and chronic disease. Hgb 7.3 appears to be at baseline -f/u CBC intermittently -transfuse If Hb < 7 or significant bleeding  -prbc 1 unit transfused   Best practice:  Diet:  Oral Pain/Anxiety/Delirium protocol (if indicated): No VAP protocol (if indicated): Not indicated DVT prophylaxis: Subcutaneous Heparin GI prophylaxis: PPI Glucose control:  SSI Yes Central venous access:  N/A Arterial line:  N/A Foley:  N/A Mobility:  bed rest  PT consulted: N/A Last date of multidisciplinary goals of care discussion [10/6] Code Status:  FULL Disposition: ICU   = Goals of Care = Code Status Order: FULL  Primary Emergency ContactHarl, Wiechmann, Home Phone: [phone] Wishes to pursue full aggressive treatment and intervention options, including CPR and intubation, but goals of care will be addressed on going with family if that should become necessary.   Critical care provider statement:    Total critical care time: 33 minutes   Performed by: Lanney Gins MD   Critical care time was exclusive of separately billable procedures and treating other patients.   Critical care was necessary to treat or prevent imminent or life-threatening deterioration.   Critical care was time spent personally by me on the following activities: development of treatment plan with patient and/or surrogate as well as nursing, discussions with consultants, evaluation of patient's response to treatment, examination of patient, obtaining history from patient or surrogate, ordering and performing treatments and interventions, ordering and review of laboratory studies, ordering and review of radiographic studies, pulse oximetry and re-evaluation of patient's condition.    Ottie Glazier, M.D.  Pulmonary & Critical Care Medicine      .

## 2021-06-28 NOTE — Progress Notes (Signed)
Patient alert & oriented but appeared to be in distress with additional swelling noted to left side of neck. Benjamine Mola, NP notified. After Benjamine Mola, NP consulted with Nephrology & Anesthesiology, a decision was made to intubate the patient to protect his airway while waiting for him to be dialyzed later on this morning. Patient intubated by without incident by anesthesiologist. Will continue to monitor.

## 2021-06-28 NOTE — Progress Notes (Addendum)
Notified by patient's RN of worsening respiratory distress and neck swelling that appeared to be worse on the left side.  Patient was noted to be restless and frothing at the mouth.   On bedside exam,he was afebrile with blood pressure 125/82 mm Hg and pulse rate 89 beats/min, oxygen saturation between 88 to upper 90s on 2 L of nasal cannula.  He appeared very restless and dyspneic with mild use of accessory muscle for breathing.  Given concerns for worsening volume overload, nephrology was contacted for possible HD. After further discussion and and considering that patient may not be able to protect his airway longer to tolerate the HD session, decision was made to secure his airway prior HD session.  A difficult airway was anticipated in the setting of orolingual angioedema and neck swelling.  Anesthesia Dr. Wynetta Emery was contacted to assist with securing airway.  Patient was successfully intubated without complications.   Rufina Falco, DNP, CCRN, FNP-C, AGACNP-BC Acute Care Nurse Practitioner  [facility] Pager: [phone] [place] at [facility]

## 2021-06-29 LAB — CBC WITH DIFFERENTIAL/PLATELET
Abs Immature Granulocytes: 0.11 10*3/uL — ABNORMAL HIGH (ref 0.00–0.07)
Basophils Absolute: 0 10*3/uL (ref 0.0–0.1)
Basophils Relative: 0 %
Eosinophils Absolute: 0 10*3/uL (ref 0.0–0.5)
Eosinophils Relative: 0 %
HCT: 25.3 % — ABNORMAL LOW (ref 39.0–52.0)
Hemoglobin: 8 g/dL — ABNORMAL LOW (ref 13.0–17.0)
Immature Granulocytes: 1 %
Lymphocytes Relative: 2 %
Lymphs Abs: 0.2 10*3/uL — ABNORMAL LOW (ref 0.7–4.0)
MCH: 31.1 pg (ref 26.0–34.0)
MCHC: 31.6 g/dL (ref 30.0–36.0)
MCV: 98.4 fL (ref 80.0–100.0)
Monocytes Absolute: 0.4 10*3/uL (ref 0.1–1.0)
Monocytes Relative: 4 %
Neutro Abs: 8.4 10*3/uL — ABNORMAL HIGH (ref 1.7–7.7)
Neutrophils Relative %: 93 %
Platelets: 137 10*3/uL — ABNORMAL LOW (ref 150–400)
RBC: 2.57 MIL/uL — ABNORMAL LOW (ref 4.22–5.81)
RDW: 16.8 % — ABNORMAL HIGH (ref 11.5–15.5)
WBC: 9.1 10*3/uL (ref 4.0–10.5)
nRBC: 0 % (ref 0.0–0.2)

## 2021-06-29 LAB — BASIC METABOLIC PANEL
Anion gap: 15 (ref 5–15)
BUN: 62 mg/dL — ABNORMAL HIGH (ref 6–20)
CO2: 25 mmol/L (ref 22–32)
Calcium: 8.7 mg/dL — ABNORMAL LOW (ref 8.9–10.3)
Chloride: 95 mmol/L — ABNORMAL LOW (ref 98–111)
Creatinine, Ser: 9.61 mg/dL — ABNORMAL HIGH (ref 0.61–1.24)
GFR, Estimated: 6 mL/min — ABNORMAL LOW (ref >60)
Glucose, Bld: 120 mg/dL — ABNORMAL HIGH (ref 70–99)
Potassium: 5.1 mmol/L (ref 3.5–5.1)
Sodium: 135 mmol/L (ref 135–145)

## 2021-06-29 LAB — GLUCOSE, CAPILLARY
Glucose-Capillary: 112 mg/dL — ABNORMAL HIGH (ref 70–99)
Glucose-Capillary: 115 mg/dL — ABNORMAL HIGH (ref 70–99)
Glucose-Capillary: 98 mg/dL (ref 70–99)
Glucose-Capillary: 112 mg/dL — ABNORMAL HIGH (ref 70–99)
Glucose-Capillary: 345 mg/dL — ABNORMAL HIGH (ref 70–99)

## 2021-06-29 LAB — MAGNESIUM: Magnesium: 1.9 mg/dL (ref 1.7–2.4)

## 2021-06-29 LAB — PHOSPHORUS: Phosphorus: 8.2 mg/dL — ABNORMAL HIGH (ref 2.5–4.6)

## 2021-06-29 LAB — TRIGLYCERIDES: Triglycerides: 129 mg/dL (ref <150)

## 2021-06-29 MED ORDER — DIPHENHYDRAMINE HCL 50 MG/ML IJ SOLN: 25.0000 mg | Freq: Every day | INTRAMUSCULAR | Status: DC

## 2021-06-29 MED ORDER — CHLORHEXIDINE GLUCONATE 0.12 % MT SOLN
OROMUCOSAL | Status: AC
  Filled 2021-06-29: qty 15

## 2021-06-29 MED ORDER — DEXAMETHASONE SODIUM PHOSPHATE 4 MG/ML IJ SOLN
4.0000 mg | Freq: Two times a day (BID) | INTRAMUSCULAR | Status: DC
  Administered 2021-06-29 – 2021-07-01 (×4): 4 mg via INTRAVENOUS
  Filled 2021-06-29 (×6): qty 1

## 2021-06-29 MED ORDER — BLISTEX MEDICATED EX OINT
TOPICAL_OINTMENT | CUTANEOUS | Status: DC | PRN
  Filled 2021-06-29: qty 6.3

## 2021-06-29 MED ORDER — INSULIN ASPART 100 UNIT/ML IJ SOLN
0.0000 [IU] | Freq: Three times a day (TID) | INTRAMUSCULAR | Status: DC
  Administered 2021-06-29: 20 [IU] via SUBCUTANEOUS
  Filled 2021-06-29: qty 1

## 2021-06-29 MED ORDER — DIPHENHYDRAMINE HCL 50 MG/ML IJ SOLN
25.0000 mg | Freq: Two times a day (BID) | INTRAMUSCULAR | Status: DC | PRN
  Administered 2021-06-29 – 2021-07-03 (×3): 25 mg via INTRAVENOUS
  Filled 2021-06-29 (×3): qty 1

## 2021-06-29 MED ORDER — FAMOTIDINE IN NACL 20-0.9 MG/50ML-% IV SOLN
20.0000 mg | Freq: Two times a day (BID) | INTRAVENOUS | Status: DC
  Administered 2021-06-29 – 2021-06-30 (×4): 20 mg via INTRAVENOUS
  Filled 2021-06-29 (×6): qty 50

## 2021-06-29 MED ORDER — ORAL CARE MOUTH RINSE
15.0000 mL | Freq: Two times a day (BID) | OROMUCOSAL | Status: DC
  Administered 2021-06-30 – 2021-07-04 (×6): 15 mL via OROMUCOSAL

## 2021-06-29 MED ORDER — INSULIN ASPART 100 UNIT/ML IJ SOLN: 0.0000 [IU] | INTRAMUSCULAR | Status: DC

## 2021-06-29 NOTE — Progress Notes (Signed)
[facility]  ROUNDING NOTE   Subjective:   [name] was admitted to [facility] on 06/26/2021 for End stage renal disease ([place]) [N18.6] Atrial fibrillation with RVR ([place]) [I48.91]   With acute respiratory failure. Patient intubated and sedated.   Hemodialysis treatment yesterday. UF of 1 liter. Required norepinephrine during treatment.   PRBC transfusion yesterday.    Objective:  Vital signs in last 24 hours:  Temp:  [96.6 F (35.9 C)-97.8 F (36.6 C)] 97 F (36.1 C) (10/08 0400) Pulse Rate:  [65-98] 93 (10/08 0800) Resp:  [12-20] 12 (10/08 0800) BP: (88-121)/(57-80) 112/71 (10/08 0800) SpO2:  [95 %-100 %] 96 % (10/08 0800) FiO2 (%):  [30 %-100 %] 30 % (10/08 0725) Weight:  [79.5 kg] 79.5 kg (10/08 0235)  Weight change: -3.5 kg Filed Weights   06/28/21 0500 06/28/21 0920 06/29/21 0235  Weight: 79 kg 79 kg 79.5 kg    Intake/Output: I/O last 3 completed shifts: In: 2062 [I.V.:1876.1; Blood:120; IV Piggyback:65.9] Out: 1000 [Other:1000]   Intake/Output this shift:  Total I/O In: 29.9 [I.V.:29.9] Out: -   Physical Exam: General: Critically ill  Head: ETT  Eyes: Anicteric,   Neck: trachea midline  Lungs:  PRVC FiO2 30%  Heart: regular  Abdomen:  Soft  Extremities:  no peripheral edema.  Neurologic: Intubated, sedated  Skin: Multiple abrasions  Access: Left AVF    Basic Metabolic Panel: Recent Labs  Lab 06/26/21 0804 06/26/21 1725 06/27/21 0607 06/27/21 2129 06/28/21 0547 06/28/21 1555 06/29/21 0418  NA 145   < > 143 140 137 136 135  K 6.1*   < > 4.3 5.1 5.6* 4.5 5.1  CL 102   < > 99 97* 98 94* 95*  CO2 20*   < > 24 23 23 26 25   GLUCOSE 95   < > 116* 122* 129* 107* 120*  BUN 107*   < > 81* 88* 92* 55* 62*  CREATININE 18.65*   < > 12.40* 13.08* 12.90* 8.49* 9.61*  CALCIUM 9.4   < > 8.9 8.6* 8.5* 8.4* 8.7*  MG 2.5*  --   --  2.2 2.0  --  1.9  PHOS 9.6*  --   --   --   --   --  8.2*   < > = values in this interval not displayed.      Liver Function Tests: Recent Labs  Lab 06/26/21 0804  AST 20  ALT 14  ALKPHOS 102  BILITOT 1.6*  PROT 8.8*  ALBUMIN 3.9    No results for input(s): LIPASE, AMYLASE in the last 168 hours. No results for input(s): AMMONIA in the last 168 hours.  CBC: Recent Labs  Lab 06/26/21 0804 06/27/21 0607 06/27/21 2129 06/28/21 0547 06/28/21 1555 06/29/21 0418  WBC 9.6 8.1 8.8 5.4  --  9.1  NEUTROABS 7.6  --   --   --   --  8.4*  HGB 7.1* 7.3* 7.3* 6.2* 7.6* 8.0*  HCT 23.0* 23.1* 24.1* 20.0* 24.3* 25.3*  MCV 101.8* 102.7* 100.4* 99.5  --  98.4  PLT 150 144* 150 116*  --  137*     Cardiac Enzymes: No results for input(s): CKTOTAL, CKMB, CKMBINDEX, TROPONINI in the last 168 hours.  BNP: Invalid input(s): POCBNP  CBG: Recent Labs  Lab 06/28/21 1612 06/28/21 1931 06/28/21 2336 06/29/21 0415 06/29/21 0717  GLUCAP 120* 118* 112* 112* 115*     Microbiology: Results for orders placed or performed during the hospital encounter  of 06/26/21  Resp Panel by RT-PCR (Flu A&B, Covid) Nasopharyngeal Swab     Status: None   Collection Time: 06/26/21  8:04 AM   Specimen: Nasopharyngeal Swab; Nasopharyngeal(NP) swabs in vial transport medium  Result Value Ref Range Status   SARS Coronavirus 2 by RT PCR NEGATIVE NEGATIVE Final    Comment: (NOTE) SARS-CoV-2 target nucleic acids are NOT DETECTED.  The SARS-CoV-2 RNA is generally detectable in upper respiratory specimens during the acute phase of infection. The lowest concentration of SARS-CoV-2 viral copies this assay can detect is 138 copies/mL. A negative result does not preclude SARS-Cov-2 infection and should not be used as the sole basis for treatment or other patient management decisions. A negative result may occur with  improper specimen collection/handling, submission of specimen other than nasopharyngeal swab, presence of viral mutation(s) within the areas targeted by this assay, and inadequate number of  viral copies(<138 copies/mL). A negative result must be combined with clinical observations, patient history, and epidemiological information. The expected result is Negative.  Fact Sheet for Patients:  EntrepreneurPulse.com.au  Fact Sheet for Healthcare Providers:  IncredibleEmployment.be  This test is no t yet approved or cleared by the [place] FDA and  has been authorized for detection and/or diagnosis of SARS-CoV-2 by FDA under an Emergency Use Authorization (EUA). This EUA will remain  in effect (meaning this test can be used) for the duration of the COVID-19 declaration under Section 564(b)(1) of the Act, 21 U.S.C.section 360bbb-3(b)(1), unless the authorization is terminated  or revoked sooner.       Influenza A by PCR NEGATIVE NEGATIVE Final   Influenza B by PCR NEGATIVE NEGATIVE Final    Comment: (NOTE) The Xpert Xpress SARS-CoV-2/FLU/RSV plus assay is intended as an aid in the diagnosis of influenza from Nasopharyngeal swab specimens and should not be used as a sole basis for treatment. Nasal washings and aspirates are unacceptable for Xpert Xpress SARS-CoV-2/FLU/RSV testing.  Fact Sheet for Patients: EntrepreneurPulse.com.au  Fact Sheet for Healthcare Providers: IncredibleEmployment.be  This test is not yet approved or cleared by the [place] FDA and has been authorized for detection and/or diagnosis of SARS-CoV-2 by FDA under an Emergency Use Authorization (EUA). This EUA will remain in effect (meaning this test can be used) for the duration of the COVID-19 declaration under Section 564(b)(1) of the Act, 21 U.S.C. section 360bbb-3(b)(1), unless the authorization is terminated or revoked.  Performed at [facility], [address] [place], [place] [address]   Culture, Respiratory w Gram Stain     Status: None (Preliminary result)   Collection Time: 06/28/21  4:00 PM    Specimen: Tracheal Aspirate; Respiratory  Result Value Ref Range Status   Specimen Description   Final    TRACHEAL ASPIRATE Performed at [facility], [address], [place], [place] [address]    Special Requests   Final    NONE Performed at [facility], [place], [place] [address]    Gram Stain   Final    NO ORGANISMS SEEN SQUAMOUS EPITHELIAL CELLS PRESENT ABUNDANT WBC PRESENT,BOTH PMN AND MONONUCLEAR ABUNDANT GRAM POSITIVE COCCI IN CHAINS IN CLUSTERS MODERATE GRAM NEGATIVE RODS RARE GRAM POSITIVE RODS Performed at [facility], [place] [address] [place], [place] [address]    Culture PENDING  Incomplete   Report Status PENDING  Incomplete    Coagulation Studies: No results for input(s): LABPROT, INR in the last 72 hours.  Urinalysis: No results for input(s): COLORURINE,  LABSPEC, PHURINE, GLUCOSEU, HGBUR, BILIRUBINUR, KETONESUR, PROTEINUR, UROBILINOGEN, NITRITE, LEUKOCYTESUR in the last 72 hours.  Invalid input(s): APPERANCEUR    Imaging: DG Chest 1 View  Result Date: 06/28/2021 CLINICAL DATA:  55 year old male intubated.  Neck swelling. EXAM: CHEST  1 VIEW COMPARISON:  Portable chest 06/27/2021. FINDINGS: Portable AP supine view at 0412 hours. Calcified aortic atherosclerosis. Cardiomegaly. Other mediastinal contours are within normal limits. Endotracheal tube tip projects just above the clavicles over the trachea. A new right EJ region intravenous access is in place. No lower neck subcutaneous emphysema or other soft tissue abnormality is identified. Stable lung volumes. No pneumothorax. No definite pleural effusion or consolidation. Widespread increased pulmonary interstitium has mildly regressed since 06/26/2021. Paucity of bowel gas in the upper abdomen. No acute osseous abnormality identified. IMPRESSION: 1. ETT tip just above the clavicles, could be advanced 1-2 cm for more optimal placement but otherwise satisfactory. 2. No neck  subcutaneous emphysema or explanation for swelling. There is a new superficial right EJ type IV access in place. 3. Cardiomegaly and Aortic Atherosclerosis (ICD10-I70.0). Pulmonary vascular congestion appears regressed since 06/26/2021. 4. No new cardiopulmonary abnormality. Electronically Signed   By: Genevie Ann M.D.   On: 06/28/2021 05:05   DG Chest 1 View  Result Date: 06/27/2021 CLINICAL DATA:  Anaphylactic reaction EXAM: CHEST  1 VIEW COMPARISON:  06/26/2021 FINDINGS: Cardiac size is moderately enlarged, similar to prior examination and similar to remote prior examination of 04/26/2021. A component of this was related to an underlying pericardial effusion better appreciated on CT examination of 05/03/2021. Superimposed mild to moderate diffuse interstitial pulmonary edema has progressed slightly in the interval since prior examination. No pneumothorax or pleural effusion. Pulmonary insufflation is stable, the lung volumes are small. No acute bone abnormality. IMPRESSION: Stable cardiomegaly, a component which likely relates to an underlying pericardial effusion as better seen on CT examination of 05/03/2021. Progressive mild to moderate pulmonary edema. Pulmonary hypoinflation. Electronically Signed   By: Fidela Salisbury M.D.   On: 06/27/2021 21:29   CT SOFT TISSUE NECK WO CONTRAST  Result Date: 06/28/2021 CLINICAL DATA:  Foreign body suspected, neck, neg xray. Left neck swelling. Clinical concern for angioedema. Renal failure on dialysis. EXAM: CT NECK WITHOUT CONTRAST TECHNIQUE: Multidetector CT imaging of the neck was performed following the standard protocol without intravenous contrast. COMPARISON:  01/14/2021 FINDINGS: Pharynx and larynx: An endotracheal tube is present and terminates at the level of the clavicular heads. There is motion artifact through the larynx, improved with limited repeat imaging. No mass is identified within limitations of noncontrast technique. Mild diffuse edema suspected in  the oral cavity and left greater than right oropharynx. There is mild retropharyngeal edema and/or small retropharyngeal effusion. Salivary glands: No salivary stone or gross mass on this unenhanced study. Thyroid: Grossly unremarkable within limitations of motion artifact. Lymph nodes: No grossly enlarged cervical lymph nodes within limitations of noncontrast technique and diffuse edema. Vascular: Widespread atherosclerotic vascular calcification. Limited intracranial: Possible subcentimeter chronic infarct in the right cerebellar hemisphere. Visualized orbits: Unremarkable. Mastoids and visualized paranasal sinuses: Right larger than left mastoid effusions. Visualized paranasal sinuses are clear. Skeleton: Diffusely increased density of the included skeletal structures compatible with renal osteodystrophy. Moderate cervical spondylosis. Upper chest: Limited assessment due to motion. Emphysema and dependent atelectasis bilaterally. Other: Widespread soft tissue edema throughout the neck, overall left greater than right. No organized fluid collection identified on this unenhanced study. IMPRESSION: Widespread edema throughout the soft tissues of the neck, left greater than right.  Oral cavity and likely mild oropharyngeal edema with mild retropharyngeal edema or small retropharyngeal effusion. These findings may reflect angioedema and generalized fluid overload although infection/cellulitis is not excluded. No organized fluid collection identified on this unenhanced study. Electronically Signed   By: Logan Bores M.D.   On: 06/28/2021 08:23     Medications:    sodium chloride     famotidine (PEPCID) IV     fentaNYL infusion INTRAVENOUS 50 mcg/hr (06/29/21 0800)   norepinephrine (LEVOPHED) Adult infusion 2 mcg/min (06/29/21 0800)   propofol (DIPRIVAN) infusion 35 mcg/kg/min (06/29/21 0800)    chlorhexidine       sodium chloride   Intravenous Once   chlorhexidine gluconate (MEDLINE KIT)  15 mL Mouth Rinse  BID   Chlorhexidine Gluconate Cloth  6 each Topical Q0600   dexamethasone (DECADRON) injection  4 mg Intravenous Q6H   diphenhydrAMINE  25 mg Intravenous Q6H   docusate  100 mg Per Tube BID   epoetin (EPOGEN/PROCRIT) injection  10,000 Units Intravenous Q M,W,F-HD   ferrous sulfate  325 mg Oral BID WC   ipratropium-albuterol  3 mL Nebulization Q6H   mouth rinse  15 mL Mouth Rinse 10 times per day   polyethylene glycol  17 g Per Tube Daily     Assessment/ Plan:  [name] is a 55 y.o. black male with end stage renal disease on hemodialysis, hypertension, COPD, EtOH abuse, atrial fibrillation, congestive heart failure who is admitted to [facility] on End stage renal disease ([place]) [N18.6] Atrial fibrillation with RVR ([place]) [I48.91]   [facility] M[facility]. Left AVF 67kg  End Stage Renal Disease: hyperkalemia, pulmonary edema and volume overload on admission.  - Monitor daily for dialysis need. No indication for dialysis today.  Hypotension: with hypertensive urgency with atrial fibrillation with rapid ventricular response on admission. Converted back to sinus rhythm.   - weaned off norepinephrine this morning.  - holding home blood pressure agents.   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 - restart phos binders when taking PO.   Acute respiratory failure: requiring intubation and mechanical ventilation. Unclear if patient had anaphylaxis/angioedema requiring intubated. More likely oral and lingual edema from volume overload from inadequate dialysis.  - Vent weaning scheduled for today.   Anemia with chronic kidney disease: status post PRBC transfusion on 10/7. Not getting outpatient ESA. Iron studies from 9/12 are acceptable.  - ESA with HD treatment     LOS: 3 Jimmi Sidener 10/8/20229:42 AM

## 2021-06-29 NOTE — Progress Notes (Signed)
NAME:  Jordan [name], MRN:  [id number], DOB:  [birth year], LOS: 3 ADMISSION DATE:  06/26/2021, CONSULTATION DATE: 06/27/2021 REFERRING MD: Sharion Settler, NP, CHIEF COMPLAINT: Shortness of breath   HPI  55 y.o with significant PMH of ESRD on HD MWF although noncompliant with HD, HTN, CHF, atrial fibrillation and COPD who presented to the ED with chief complaints of acute onset worsening dyspnea, chest discomfort, and abdominal distention after missing multiple dialysis session due to being incarcerated for 1 week.  ED Course: On arrival to the ED, he was afebrile with blood pressure 153/108 mm Hg and pulse rate 95 beats/min, oxygen saturation 100% on room air.  He was noted with rales and rhonchi to bilateral lungs with abdominal distention consistent with edema/ascites.  Due to concerns of fluid overload related to missed hemodialysis chest x-ray was obtained which showed pulmonary edema and concern for possible Pericardial effusion.  Labs significant for potassium of 6.1.  Patient received calcium gluconate, insulin and D50.  Nephrology was consulted who advised the patient to be dialyzed and admitted for further management.  Following completion of his hemodialysis, patient went into A. fib with RVR and was started on diltiazem drip for rate control.  Patient was admitted under hospitalist service.  Hospital Course: During the course of admission, patient remained on Dilts drip for A. fib with RVR.  On 10/6 at around 8 PM rapid response called for signs of anaphylactic reaction.  Patient noted with orolingual angioedema without associated Hypoxemia, tachypnea, rash/hives, hypotension or bronchospasm/wheezing. On arrival to the ICU, Physical examination revealed an edematous tongue and asymmetric edema in the neck right>left with increased respiratory distress and stridor. He was treated with racemic epinephrine via nebulizer, IM epinephrine 0.3 mg, Benadryl 50 mg IV, Pepcid 20 mg IV, and Solu-Medrol  125 mg IV with immediate response. Detailed enquiry did not reveal anything ingested, contacted, inhaled or otherwise exposed to prior to incident. Patient continued to protect his airway and therefore did not require immediate airway management. PCCM consulted. Significant Hospital Events   10/5: Admitted under hospitalist service for acute on chronic respiratory failure, CHF and A. fib with RVR 10/6: Rapid response called for anaphylactic/hypersensitivity reaction.  Patient transferred to the ICU.  PCCM consulted 10/7- patient on vent 70% and levophed, orolingual swelling improved post renal replacement therapy. patient is imrpoved post HD, tongue is 50% less swollen pos dialysis today.  06/29/21- Patient improved, swelling resolved, +cuff leak an dweaned to 30%FiO2 on MV, plan for SBT and liberation from vent today.   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 rate 12, height 5' 4"  (1.626 m), weight 79.5 kg, SpO2 96 %.    Vent Mode: PRVC FiO2 (%):  [30 %-100 %] 30 % Set Rate:  [18 bmp] 18 bmp Vt Set:  [480 mL] 480 mL PEEP:  [5 cmH20-10 cmH20] 5 cmH20 Plateau Pressure:  [22 cmH20] 22 cmH20   Intake/Output Summary (Last 24 hours) at 06/29/2021 [phone]  Last data filed at 06/29/2021 0800 Gross per 24 hour  Intake 955.82 ml  Output 1000 ml  Net -44.18 ml     Filed Weights   06/28/21 0500 06/28/21 0920 06/29/21 0235  Weight: 79 kg 79 kg 79.5 kg   Physical Examination  GENERAL: 55 year-old critically ill patient lying in the bed in moderate respiratory distress.  EYES: Pupils equal, round, reactive to light and accommodation. No scleral icterus. Extraocular muscles intact.  HEENT: Head atraumatic, normocephalic. Orolingual angioedema noted  NECK:  Supple, moderate jugular venous distention. Neck swelling bilaterally left>right No thyroid enlargement, no tenderness.  LUNGS: Decreased breath sounds bilaterally, mild to moderate wheezing, rales,rhonchi.  No crepitation. mild use of accessory muscles of respiration.  CARDIOVASCULAR: S1, S2 normal. No murmurs, rubs, or gallops.  ABDOMEN: Markedly distended abdomen/ascites. Bowel sounds present. No organomegaly or mass.  EXTREMITIES: Generalized edema, cyanosis, or clubbing.  NEUROLOGIC: Cranial nerves II through XII are intact.  Moving all extremities. Sensation intact. Gait not checked.  PSYCHIATRIC: The patient is alert and oriented to self SKIN: No obvious rash, lesion, or ulcer.   Labs/imaging that I havepersonally reviewed  (right click and "Reselect all SmartList Selections" daily)     Labs   CBC: Recent Labs  Lab 06/26/21 0804 06/27/21 0607 06/27/21 2129 06/28/21 0547 06/28/21 1555 06/29/21 0418  WBC 9.6 8.1 8.8 5.4  --  9.1  NEUTROABS 7.6  --   --   --   --  8.4*  HGB 7.1* 7.3* 7.3* 6.2* 7.6* 8.0*  HCT 23.0* 23.1* 24.1* 20.0* 24.3* 25.3*  MCV 101.8* 102.7* 100.4* 99.5  --  98.4  PLT 150 144* 150 116*  --  137*     Basic Metabolic Panel: Recent Labs  Lab 06/26/21 0804 06/26/21 1725 06/27/21 0607 06/27/21 2129 06/28/21 0547 06/28/21 1555 06/29/21 0418  NA 145   < > 143 140 137 136 135  K 6.1*   < > 4.3 5.1 5.6* 4.5 5.1  CL 102   < > 99 97* 98 94* 95*  CO2 20*   < > 24 23 23 26 25   GLUCOSE 95   < > 116* 122* 129* 107* 120*  BUN 107*   < > 81* 88* 92* 55* 62*   CREATININE 18.65*   < > 12.40* 13.08* 12.90* 8.49* 9.61*  CALCIUM 9.4   < > 8.9 8.6* 8.5* 8.4* 8.7*  MG 2.5*  --   --  2.2 2.0  --  1.9  PHOS 9.6*  --   --   --   --   --  8.2*   < > = values in this interval not displayed.    GFR: Estimated Creatinine Clearance: 8.5 mL/min (A) (by C-G formula based on SCr of 9.61 mg/dL (H)). Recent Labs  Lab 06/26/21 0804 06/27/21 0607 06/27/21 2129 06/28/21 0547 06/29/21 0418  WBC 9.6 8.1 8.8 5.4 9.1  LATICACIDVEN 1.6  --   --   --   --      Liver Function Tests: Recent Labs  Lab 06/26/21 0804  AST 20  ALT 14  ALKPHOS 102  BILITOT 1.6*  PROT 8.8*  ALBUMIN 3.9    No results for input(s): LIPASE, AMYLASE in the last 168 hours. No results for input(s): AMMONIA in the last 168 hours.  ABG    Component Value Date/Time   PHART 7.37 06/28/2021 0437   PCO2ART 41 06/28/2021 0437   PO2ART 83 06/28/2021 0437   HCO3 23.7 06/28/2021  0437   TCO2 19 (L) 06/02/2021 1152   ACIDBASEDEF 1.5 06/28/2021 0437   O2SAT 95.8 06/28/2021 0437      Coagulation Profile: No results for input(s): INR, PROTIME in the last 168 hours.  Cardiac Enzymes: No results for input(s): CKTOTAL, CKMB, CKMBINDEX, TROPONINI in the last 168 hours.  HbA1C: Hgb A1c MFr Bld  Date/Time Value Ref Range Status  04/07/2020 11:59 AM 5.0 4.8 - 5.6 % Final    Comment:    (NOTE) Pre diabetes:          5.7%-6.4%  Diabetes:              >6.4%  Glycemic control for   <7.0% adults with diabetes   06/23/2019 08:40 AM 6.3 4.6 - 6.5 % Final    Comment:    Glycemic Control Guidelines for People with Diabetes:Non Diabetic:  <6%Goal of Therapy: <7%Additional Action Suggested:  >8%     CBG: Recent Labs  Lab 06/28/21 1612 06/28/21 1931 06/28/21 2336 06/29/21 0415 06/29/21 0717  GLUCAP 120* 118* 112* 112* 115*     Review of Systems:   UNABLE TO OBTAIN DUE TO PATIENT IN RESPIRATORY DISTRESS  Past Medical History  He,  has a past medical history of Anemia, Aortic  atherosclerosis ([place]) (11/12/2019), ED (erectile dysfunction), Emphysema of lung ([place]) (11/12/2019), ESRD on hemodialysis ([place]), ETOH abuse, History of blood transfusion, Hypertension, and Wears dentures.   Surgical History    Past Surgical History:  Procedure Laterality Date   BASCILIC VEIN TRANSPOSITION Left 08/07/2016   Procedure: LEFT BASILIC VEIN TRANSPOSITION;  Surgeon: Angelia Mould, MD;  Location: [place];  Service: Vascular;  Laterality: Left;   CLOSED REDUCTION NASAL FRACTURE N/A 08/11/2019   Procedure: CLOSED REDUCTION NASAL FRACTURE WITH STABILIZATION;  Surgeon: Irene Limbo, MD;  Location: [place];  Service: Plastics;  Laterality: N/A;   COLONOSCOPY N/A 08/12/2016   Procedure: COLONOSCOPY;  Surgeon: Otis Brace, MD;  Location: [place];  Service: Gastroenterology;  Laterality: N/A;   COLONOSCOPY WITH PROPOFOL N/A 06/05/2021   Procedure: COLONOSCOPY WITH PROPOFOL;  Surgeon: Sharyn Creamer, MD;  Location: [place];  Service: Gastroenterology;  Laterality: N/A;   ESOPHAGOGASTRODUODENOSCOPY N/A 08/12/2016   Procedure: ESOPHAGOGASTRODUODENOSCOPY (EGD);  Surgeon: Otis Brace, MD;  Location: [place];  Service: Gastroenterology;  Laterality: N/A;   ESOPHAGOGASTRODUODENOSCOPY (EGD) WITH PROPOFOL N/A 06/05/2021   Procedure: ESOPHAGOGASTRODUODENOSCOPY (EGD) WITH PROPOFOL;  Surgeon: Sharyn Creamer, MD;  Location: [place];  Service: Gastroenterology;  Laterality: N/A;   INSERTION OF DIALYSIS CATHETER N/A 08/07/2016   Procedure: INSERTION OF TUNNELED DIALYSIS CATHETER;  Surgeon: Angelia Mould, MD;  Location: [place];  Service: Vascular;  Laterality: N/A;   LIGATION OF ARTERIOVENOUS  FISTULA Left 09/12/2016   Procedure: BANDING OF LEFT  ARTERIOVENOUS  FISTULA;  Surgeon: Angelia Mould, MD;  Location: [place];  Service: Vascular;  Laterality: Left;   LOWER EXTREMITY INTERVENTION Right 12/02/2018   Procedure: LOWER EXTREMITY INTERVENTION;  Surgeon: Algernon Huxley, MD;  Location: [place] [facility];  Service: Cardiovascular;  Laterality: Right;   POLYPECTOMY  06/05/2021   Procedure: POLYPECTOMY;  Surgeon: Sharyn Creamer, MD;  Location: [facility] ENDOSCOPY;  Service: Gastroenterology;;     Social History   reports that he has been smoking cigarettes. He has a 20.00 pack-year smoking history. He has never used smokeless tobacco. He reports that he does not currently use alcohol after a past usage of about 21.0 standard drinks per week. He reports that he  does not use drugs.   Family History   His family history includes Diabetes in his mother; Healthy in his father and sister; Kidney disease in his daughter; Kidney failure in his brother and mother. There is no history of Post-traumatic stress disorder, Bladder Cancer, or Kidney cancer.   Allergies Allergies  Allergen Reactions   Lisinopril Swelling     Home Medications  Prior to Admission medications   Medication Sig Start Date End Date Taking? Authorizing Provider  albuterol (VENTOLIN HFA) 108 (90 Base) MCG/ACT inhaler Inhale 2 puffs into the lungs every 4 (four) hours as needed for wheezing or shortness of breath. 06/05/21  Yes Danford, Suann Larry, MD  calcium acetate (PHOSLO) 667 MG capsule Take 2 capsules (1,334 mg total) by mouth 3 (three) times daily with meals. 06/05/21  Yes Danford, Suann Larry, MD  cinacalcet (SENSIPAR) 30 MG tablet Take 1 tablet (30 mg total) by mouth daily with breakfast. 06/05/21  Yes Danford, Suann Larry, MD  docusate sodium (COLACE) 100 MG capsule Take 1 capsule (100 mg total) by mouth 2 (two) times daily. 06/05/21  Yes Danford, Suann Larry, MD  epoetin alfa (EPOGEN) 4000 UNIT/ML injection Inject 1 mL (4,000 Units total) into the vein every Monday, Wednesday, and Friday with hemodialysis. 06/22/20  Yes Enzo Bi, MD  ferrous sulfate 325 (65 FE) MG tablet Take 1 tablet (325 mg total) by mouth 2 (two) times daily with a meal. 06/05/21  Yes Danford, Suann Larry, MD   hydrALAZINE (APRESOLINE) 50 MG tablet Take 1 tablet (50 mg total) by mouth 3 (three) times daily. 06/05/21  Yes Danford, Suann Larry, MD  pantoprazole (PROTONIX) 20 MG tablet Take 1 tablet (20 mg total) by mouth 2 (two) times daily before a meal. 06/05/21 08/04/21 Yes Danford, Suann Larry, MD    Scheduled Meds:  chlorhexidine       sodium chloride   Intravenous Once   chlorhexidine gluconate (MEDLINE KIT)  15 mL Mouth Rinse BID   Chlorhexidine Gluconate Cloth  6 each Topical Q0600   dexamethasone (DECADRON) injection  4 mg Intravenous Q6H   diphenhydrAMINE  25 mg Intravenous Q6H   docusate  100 mg Per Tube BID   epoetin (EPOGEN/PROCRIT) injection  10,000 Units Intravenous Q M,W,F-HD   ferrous sulfate  325 mg Oral BID WC   ipratropium-albuterol  3 mL Nebulization Q6H   mouth rinse  15 mL Mouth Rinse 10 times per day   polyethylene glycol  17 g Per Tube Daily   Continuous Infusions:  sodium chloride     famotidine (PEPCID) IV     fentaNYL infusion INTRAVENOUS 50 mcg/hr (06/29/21 0800)   norepinephrine (LEVOPHED) Adult infusion 2 mcg/min (06/29/21 0800)   propofol (DIPRIVAN) infusion 35 mcg/kg/min (06/29/21 0800)   PRN Meds:.acetaminophen **OR** acetaminophen, albuterol, fentaNYL, magnesium hydroxide  Assessment & Plan:   Acute on Chronic respiratory failure with hypoxia due to pulmonary edema from hypervolemia exacerbated by hypersensitivity reaction  Hx: COPD, Emphysema, tobacco abuse. -weaning from MV today with SBT - currently on PRVC 30%FiO2 and good cuff leak   Airway compromise with Oroligual swelling  -Possible due to anasarca from missed HD vs allergic anaphylactic reaction.     - allergen profile and C1 esterase deficiency Status post racemic epinephrine via nebulizer, IM epinephrine 0.3 mg, Benadryl 50 mg IV, Pepcid 20 mg IV, and Solu-Medrol 125 mg IV x 1 dose -CT reviewed - Swelling soft tt + -improved post HD-swelling resolved now cuff leak + on ETT  Acute  on  chronic combined CHF Pericardial effusion likely in setting of uremia Hx of HTN Volume overload in the setting of missed HD s/p HD on admission 10/5 -Continuous cardiac monitoring -IV Lasix as blood pressure and renal function permits; currently on Lasix 40 mg IV BID -Hold BP meds in the setting of hypersensitivity reaction with potential hypotension -Cardiology consult -Repeat 2D Echocardiogram  Afib+RVR  -follow cardio team recommendatiosn -Consider weaning to oral Diltiazem 30-43m PO qid while titrating dose, then transition to SA daily formulation  # Thromboembolism Risk Management, likely not a candidate for anticoagulation given noncompliance -Cardiology consult  ESRD on HD MWF Metabolic bone disease -Monitor I&O's / urinary output -Follow BMP -Ensure adequate renal perfusion -Replace electrolytes as indicated -Renal diet -Renally dose meds -Intermittent HD per renal- planning for tomorrow  Acute metabolic encephalopathy 2nd to hypoxia, uremia. ETOH Abuse high risk for withdrawal. -may need precedex with RASS goal 0 to -1 -Monitor CMP, INR, Daily BMP+Mg -CIWA symptom triggered PRNs -Daily Thiamine, Folate, MVI once tolerating PO -Insomnia Options: Trazodone if needed -SW consult for cessation resources -PT/OT evaluation for mobility  Anemia of critical illness and chronic disease. Hgb 7.3 appears to be at baseline -f/u CBC intermittently -transfuse If Hb < 7 or significant bleeding  -prbc 1 unit transfused   Best practice:  Diet:  Oral Pain/Anxiety/Delirium protocol (if indicated): No VAP protocol (if indicated): Not indicated DVT prophylaxis: Subcutaneous Heparin GI prophylaxis: PPI Glucose control:  SSI Yes Central venous access:  N/A Arterial line:  N/A Foley:  N/A Mobility:  bed rest  PT consulted: N/A Last date of multidisciplinary goals of care discussion [10/6] Code Status:  FULL Disposition: ICU   = Goals of Care = Code Status Order: FULL   Primary Emergency Contact:Dmarius, Reeder Home Phone: [phone] to pursue full aggressive treatment and intervention options, including CPR and intubation, but goals of care will be addressed on going with family if that should become necessary.   Critical care provider statement:   Total critical care time: 33 minutes   Performed by: ALanney GinsMD   Critical care time was exclusive of separately billable procedures and treating other patients.   Critical care was necessary to treat or prevent imminent or life-threatening deterioration.   Critical care was time spent personally by me on the following activities: development of treatment plan with patient and/or surrogate as well as nursing, discussions with consultants, evaluation of patient's response to treatment, examination of patient, obtaining history from patient or surrogate, ordering and performing treatments and interventions, ordering and review of laboratory studies, ordering and review of radiographic studies, pulse oximetry and re-evaluation of patient's condition.    FOttie Glazier M.D.  Pulmonary & Critical Care Medicine      .

## 2021-06-29 NOTE — Progress Notes (Signed)
SUBJECTIVE: Patient remains intubated unable to get much history.   Vitals:   06/29/21 0935 06/29/21 1000 06/29/21 1100 06/29/21 1111  BP:  110/68 125/81   Pulse: 82 82    Resp: '18 15 19   '$ Temp:      TempSrc:      SpO2: 97% 91%  97%  Weight:      Height:        Intake/Output Summary (Last 24 hours) at 06/29/2021 1145 Last data filed at 06/29/2021 0800 Gross per 24 hour  Intake 955.82 ml  Output 1000 ml  Net -44.18 ml    LABS: Basic Metabolic Panel: Recent Labs    06/28/21 0547 06/28/21 1555 06/29/21 0418  NA 137 136 135  K 5.6* 4.5 5.1  CL 98 94* 95*  CO2 '23 26 25  '$ GLUCOSE 129* 107* 120*  BUN 92* 55* 62*  CREATININE 12.90* 8.49* 9.61*  CALCIUM 8.5* 8.4* 8.7*  MG 2.0  --  1.9  PHOS  --   --  8.2*   Liver Function Tests: No results for input(s): AST, ALT, ALKPHOS, BILITOT, PROT, ALBUMIN in the last 72 hours. No results for input(s): LIPASE, AMYLASE in the last 72 hours. CBC: Recent Labs    06/28/21 0547 06/28/21 1555 06/29/21 0418  WBC 5.4  --  9.1  NEUTROABS  --   --  8.4*  HGB 6.2* 7.6* 8.0*  HCT 20.0* 24.3* 25.3*  MCV 99.5  --  98.4  PLT 116*  --  137*   Cardiac Enzymes: No results for input(s): CKTOTAL, CKMB, CKMBINDEX, TROPONINI in the last 72 hours. BNP: Invalid input(s): POCBNP D-Dimer: No results for input(s): DDIMER in the last 72 hours. Hemoglobin A1C: No results for input(s): HGBA1C in the last 72 hours. Fasting Lipid Panel: Recent Labs    06/29/21 0418  TRIG 129   Thyroid Function Tests: No results for input(s): TSH, T4TOTAL, T3FREE, THYROIDAB in the last 72 hours.  Invalid input(s): FREET3 Anemia Panel: No results for input(s): VITAMINB12, FOLATE, FERRITIN, TIBC, IRON, RETICCTPCT in the last 72 hours.   PHYSICAL EXAM General: Well developed, well nourished, in no acute distress HEENT:  Normocephalic and atramatic Neck:  No JVD.  Lungs: Clear bilaterally to auscultation and percussion. Heart: HRRR . Normal S1 and S2 without  gallops or murmurs.  Abdomen: Bowel sounds are positive, abdomen soft and non-tender  Msk:  Back normal, normal gait. Normal strength and tone for age. Extremities: No clubbing, cyanosis or edema.   Neuro: Alert and oriented X 3. Psych:  Good affect, responds appropriately  TELEMETRY: Sinus rhythm approximately 100 bpm  ASSESSMENT AND PLAN: Atrial fibrillation with rapid ventricular response rate and hypotension and end-stage renal disease.  Respiratory failure currently intubated.  Patient right now in sinus rhythm and we will continue to watch the patient closely.  Active Problems:   Atrial fibrillation with RVR (HCC)    Neoma Laming A, MD, [facility] 06/29/2021 11:45 AM

## 2021-06-30 DIAGNOSIS — J9621 Acute and chronic respiratory failure with hypoxia: Secondary | ICD-10-CM

## 2021-06-30 DIAGNOSIS — I5043 Acute on chronic combined systolic (congestive) and diastolic (congestive) heart failure: Secondary | ICD-10-CM | POA: Diagnosis not present

## 2021-06-30 DIAGNOSIS — N186 End stage renal disease: Secondary | ICD-10-CM | POA: Diagnosis not present

## 2021-06-30 DIAGNOSIS — Z992 Dependence on renal dialysis: Secondary | ICD-10-CM | POA: Diagnosis not present

## 2021-06-30 LAB — GLUCOSE, CAPILLARY
Glucose-Capillary: 37 mg/dL — CL (ref 70–99)
Glucose-Capillary: 80 mg/dL (ref 70–99)
Glucose-Capillary: 129 mg/dL — ABNORMAL HIGH (ref 70–99)

## 2021-06-30 LAB — RENAL FUNCTION PANEL
Albumin: 3.3 g/dL — ABNORMAL LOW (ref 3.5–5.0)
Anion gap: 17 — ABNORMAL HIGH (ref 5–15)
BUN: 89 mg/dL — ABNORMAL HIGH (ref 6–20)
CO2: 25 mmol/L (ref 22–32)
Calcium: 8.2 mg/dL — ABNORMAL LOW (ref 8.9–10.3)
Chloride: 97 mmol/L — ABNORMAL LOW (ref 98–111)
Creatinine, Ser: 10.53 mg/dL — ABNORMAL HIGH (ref 0.61–1.24)
GFR, Estimated: 5 mL/min — ABNORMAL LOW (ref >60)
Glucose, Bld: 41 mg/dL — CL (ref 70–99)
Phosphorus: 8.9 mg/dL — ABNORMAL HIGH (ref 2.5–4.6)
Potassium: 5.5 mmol/L — ABNORMAL HIGH (ref 3.5–5.1)
Sodium: 139 mmol/L (ref 135–145)

## 2021-06-30 LAB — CBC
HCT: 25.8 % — ABNORMAL LOW (ref 39.0–52.0)
Hemoglobin: 8 g/dL — ABNORMAL LOW (ref 13.0–17.0)
MCH: 30 pg (ref 26.0–34.0)
MCHC: 31 g/dL (ref 30.0–36.0)
MCV: 96.6 fL (ref 80.0–100.0)
Platelets: 130 10*3/uL — ABNORMAL LOW (ref 150–400)
RBC: 2.67 MIL/uL — ABNORMAL LOW (ref 4.22–5.81)
RDW: 15.9 % — ABNORMAL HIGH (ref 11.5–15.5)
WBC: 18 10*3/uL — ABNORMAL HIGH (ref 4.0–10.5)
nRBC: 0.1 % (ref 0.0–0.2)

## 2021-06-30 LAB — MAGNESIUM: Magnesium: 2.1 mg/dL (ref 1.7–2.4)

## 2021-06-30 LAB — CULTURE, RESPIRATORY W GRAM STAIN
Culture: NORMAL
Gram Stain: NONE SEEN

## 2021-06-30 MED ORDER — METOPROLOL TARTRATE 50 MG PO TABS
50.0000 mg | ORAL_TABLET | Freq: Two times a day (BID) | ORAL | Status: DC
  Administered 2021-06-30 – 2021-07-04 (×8): 50 mg via ORAL
  Filled 2021-06-30 (×9): qty 1

## 2021-06-30 MED ORDER — DEXTROSE 50 % IV SOLN
INTRAVENOUS | Status: AC
  Administered 2021-06-30: 50 mL
  Filled 2021-06-30: qty 50

## 2021-06-30 MED ORDER — CINACALCET HCL 30 MG PO TABS
30.0000 mg | ORAL_TABLET | Freq: Every day | ORAL | Status: DC
  Administered 2021-07-01 – 2021-07-05 (×5): 30 mg via ORAL
  Filled 2021-06-30 (×5): qty 1

## 2021-06-30 MED ORDER — HEPARIN SODIUM (PORCINE) 5000 UNIT/ML IJ SOLN
5000.0000 [IU] | Freq: Three times a day (TID) | INTRAMUSCULAR | Status: DC
  Administered 2021-06-30 – 2021-07-03 (×8): 5000 [IU] via SUBCUTANEOUS
  Filled 2021-06-30 (×8): qty 1

## 2021-06-30 MED ORDER — GLUCOSE 40 % PO GEL: 1.0000 | ORAL | Status: AC

## 2021-06-30 MED ORDER — CALCIUM ACETATE (PHOS BINDER) 667 MG PO CAPS
2001.0000 mg | ORAL_CAPSULE | Freq: Three times a day (TID) | ORAL | Status: DC
  Administered 2021-06-30 – 2021-07-05 (×7): 2001 mg via ORAL
  Filled 2021-06-30 (×16): qty 3

## 2021-06-30 NOTE — Progress Notes (Signed)
Swallow eval done at 2000 on 10/08, he was able to swallow 3 full ounces of water with no difficulty. Notified NP and received order for diet

## 2021-06-30 NOTE — Progress Notes (Signed)
CSW received a message back from Coram from [place] who stated she may be able to take patients insurance for home health but will not know until tomorrow 07/01/21 after 10 AM.

## 2021-06-30 NOTE — Progress Notes (Signed)
CSW was notified by [facility] home health who stated they currently do not have anyone for PT due to the person resigning. CSW was told they can take patient for only nursing. CSW reached out to PT to see if this was possible.

## 2021-06-30 NOTE — Progress Notes (Signed)
NAME:  Jordan [name], MRN:  [id number], DOB:  07-16-1966, LOS: 4 ADMISSION DATE:  06/26/2021, CONSULTATION DATE: 06/27/2021 REFERRING MD: Sharion Settler, NP, CHIEF COMPLAINT: Shortness of breath   HPI  55 y.o with significant PMH of ESRD on HD MWF although noncompliant with HD, HTN, CHF, atrial fibrillation and COPD who presented to the ED with chief complaints of acute onset worsening dyspnea, chest discomfort, and abdominal distention after missing multiple dialysis session due to being incarcerated for 1 week.  ED Course: On arrival to the ED, he was afebrile with blood pressure 153/108 mm Hg and pulse rate 95 beats/min, oxygen saturation 100% on room air.  He was noted with rales and rhonchi to bilateral lungs with abdominal distention consistent with edema/ascites.  Due to concerns of fluid overload related to missed hemodialysis chest x-ray was obtained which showed pulmonary edema and concern for possible Pericardial effusion.  Labs significant for potassium of 6.1.  Patient received calcium gluconate, insulin and D50.  Nephrology was consulted who advised the patient to be dialyzed and admitted for further management.  Following completion of his hemodialysis, patient went into A. fib with RVR and was started on diltiazem drip for rate control.  Patient was admitted under hospitalist service.  Hospital Course: During the course of admission, patient remained on Dilts drip for A. fib with RVR.  On 10/6 at around 8 PM rapid response called for signs of anaphylactic reaction.  Patient noted with orolingual angioedema without associated Hypoxemia, tachypnea, rash/hives, hypotension or bronchospasm/wheezing. On arrival to the ICU, Physical examination revealed an edematous tongue and asymmetric edema in the neck right>left with increased respiratory distress and stridor. He was treated with racemic epinephrine via nebulizer, IM epinephrine 0.3 mg, Benadryl 50 mg IV, Pepcid 20 mg IV, and Solu-Medrol  125 mg IV with immediate response. Detailed enquiry did not reveal anything ingested, contacted, inhaled or otherwise exposed to prior to incident. Patient continued to protect his airway and therefore did not require immediate airway management. PCCM consulted. Significant Hospital Events   10/5: Admitted under hospitalist service for acute on chronic respiratory failure, CHF and A. fib with RVR 10/6: Rapid response called for anaphylactic/hypersensitivity reaction.  Patient transferred to the ICU.  PCCM consulted 10/7- patient on vent 70% and levophed, orolingual swelling improved post renal replacement therapy. patient is imrpoved post HD, tongue is 50% less swollen pos dialysis today.  06/29/21- Patient improved, swelling resolved, +cuff leak an dweaned to 30%FiO2 on MV, plan for SBT and liberation from vent today.   06/30/21-patient improved back complains of feeling unwell.  Denies pain and has been stable throughout.  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 rate 16, height '5\' 4"'$  (1.626 m), weight 79.5 kg, SpO2 92 %.    Vent Mode: PRVC FiO2 (%):  [30 %] 30 % Set Rate:  [18 bmp] 18 bmp Vt Set:  [480 mL]  480 mL PEEP:  [5 cmH20] 5 cmH20   Intake/Output Summary (Last 24 hours) at 06/30/2021 1031 Last data filed  at 06/29/2021 2009 Gross per 24 hour  Intake 37.38 ml  Output --  Net 37.38 ml    Filed Weights   06/28/21 0500 06/28/21 0920 06/29/21 0235  Weight: 79 kg 79 kg 79.5 kg   Physical Examination  GENERAL: 55 year-old in no acute distress EYES: Pupils equal, round, reactive to light and accommodation. No scleral icterus. Extraocular muscles intact.  HEENT: Head atraumatic, normocephalic.  No swelling noted NECK:  Supple, No thyroid enlargement, no tenderness.  LUNGS: Mild rhonchorous breath sounds CARDIOVASCULAR: S1, S2 normal. No murmurs, rubs, or gallops.  ABDOMEN: Mildly distended abdomen/ascites. Bowel sounds present. No organomegaly or mass.  EXTREMITIES: Generalized edema, cyanosis, or clubbing.  NEUROLOGIC: Cranial nerves II through XII are intact.  Moving all extremities. Sensation intact. Gait not checked.  PSYCHIATRIC: The patient is alert and oriented to self SKIN: No obvious rash, lesion, or ulcer.   Labs/imaging that I havepersonally reviewed  (right click and "Reselect all SmartList Selections" daily)     Labs   CBC: Recent Labs  Lab 06/26/21 0804 06/27/21 0607 06/27/21 2129 06/28/21 0547 06/28/21 1555 06/29/21 0418 06/30/21 0537  WBC 9.6 8.1 8.8 5.4  --  9.1 18.0*  NEUTROABS 7.6  --   --   --   --  8.4*  --   HGB 7.1* 7.3* 7.3* 6.2* 7.6* 8.0* 8.0*  HCT 23.0* 23.1* 24.1* 20.0* 24.3* 25.3* 25.8*  MCV 101.8* 102.7* 100.4* 99.5  --  98.4 96.6  PLT 150 144* 150 116*  --  137* 130*     Basic Metabolic Panel: Recent Labs  Lab 06/26/21 0804 06/26/21 1725 06/27/21 2129 06/28/21 0547 06/28/21 1555 06/29/21 0418 06/30/21 0537  NA 145   < > 140 137 136 135 139  K 6.1*   < > 5.1 5.6* 4.5 5.1 5.5*  CL 102   < > 97* 98 94* 95* 97*  CO2 20*   < > '23 23 26 25 25  '$ GLUCOSE 95   < > 122* 129* 107* 120* 41*  BUN 107*   < > 88* 92* 55* 62* 89*  CREATININE 18.65*   < > 13.08* 12.90* 8.49* 9.61* 10.53*  CALCIUM 9.4   < > 8.6* 8.5* 8.4* 8.7* 8.2*  MG 2.5*  --  2.2  2.0  --  1.9 2.1  PHOS 9.6*  --   --   --   --  8.2* 8.9*   < > = values in this interval not displayed.    GFR: Estimated Creatinine Clearance: 7.7 mL/min (A) (by C-G formula based on SCr of 10.53 mg/dL (H)). Recent Labs  Lab 06/26/21 0804 06/27/21 0607 06/27/21 2129 06/28/21 0547 06/29/21 0418 06/30/21 0537  WBC 9.6   < > 8.8 5.4 9.1 18.0*  LATICACIDVEN 1.6  --   --   --   --   --    < > = values in this interval not displayed.     Liver Function Tests: Recent Labs  Lab 06/26/21 0804 06/30/21 0537  AST 20  --   ALT 14  --   ALKPHOS 102  --   BILITOT 1.6*  --   PROT 8.8*  --   ALBUMIN 3.9 3.3*    No results for input(s): LIPASE, AMYLASE in the last 168 hours. No results for input(s): AMMONIA in the last 168 hours.  ABG  Component Value Date/Time   PHART 7.37 06/28/2021 0437   PCO2ART 41 06/28/2021 0437   PO2ART 83 06/28/2021 0437   HCO3 23.7 06/28/2021 0437   TCO2 19 (L) 06/02/2021 1152   ACIDBASEDEF 1.5 06/28/2021 0437   O2SAT 95.8 06/28/2021 0437      Coagulation Profile: No results for input(s): INR, PROTIME in the last 168 hours.  Cardiac Enzymes: No results for input(s): CKTOTAL, CKMB, CKMBINDEX, TROPONINI in the last 168 hours.  HbA1C: Hgb A1c MFr Bld  Date/Time Value Ref Range Status  04/07/2020 11:59 AM 5.0 4.8 - 5.6 % Final    Comment:    (NOTE) Pre diabetes:          5.7%-6.4%  Diabetes:              >6.4%  Glycemic control for   <7.0% adults with diabetes   06/23/2019 08:40 AM 6.3 4.6 - 6.5 % Final    Comment:    Glycemic Control Guidelines for People with Diabetes:Non Diabetic:  <6%Goal of Therapy: <7%Additional Action Suggested:  >8%     CBG: Recent Labs  Lab 06/29/21 1138 06/29/21 1553 06/29/21 2042 06/30/21 0710 06/30/21 0854  GLUCAP 98 112* 345* 37* 80     Review of Systems:   10 point ROS conducted and is negative except for feeling unwell with generalized fatigue   Surgical History    Past Surgical  History:  Procedure Laterality Date   BASCILIC VEIN TRANSPOSITION Left 08/07/2016   Procedure: LEFT BASILIC VEIN TRANSPOSITION;  Surgeon: Angelia Mould, MD;  Location: [place];  Service: Vascular;  Laterality: Left;   CLOSED REDUCTION NASAL FRACTURE N/A 08/11/2019   Procedure: CLOSED REDUCTION NASAL FRACTURE WITH STABILIZATION;  Surgeon: Irene Limbo, MD;  Location: [facility];  Service: Plastics;  Laterality: N/A;   COLONOSCOPY N/A 08/12/2016   Procedure: COLONOSCOPY;  Surgeon: Otis Brace, MD;  Location: [place];  Service: Gastroenterology;  Laterality: N/A;   COLONOSCOPY WITH PROPOFOL N/A 06/05/2021   Procedure: COLONOSCOPY WITH PROPOFOL;  Surgeon: Sharyn Creamer, MD;  Location: [place];  Service: Gastroenterology;  Laterality: N/A;   ESOPHAGOGASTRODUODENOSCOPY N/A 08/12/2016   Procedure: ESOPHAGOGASTRODUODENOSCOPY (EGD);  Surgeon: Otis Brace, MD;  Location: [place];  Service: Gastroenterology;  Laterality: N/A;   ESOPHAGOGASTRODUODENOSCOPY (EGD) WITH PROPOFOL N/A 06/05/2021   Procedure: ESOPHAGOGASTRODUODENOSCOPY (EGD) WITH PROPOFOL;  Surgeon: Sharyn Creamer, MD;  Location: [place];  Service: Gastroenterology;  Laterality: N/A;   INSERTION OF DIALYSIS CATHETER N/A 08/07/2016   Procedure: INSERTION OF TUNNELED DIALYSIS CATHETER;  Surgeon: Angelia Mould, MD;  Location: [place];  Service: Vascular;  Laterality: N/A;   LIGATION OF ARTERIOVENOUS  FISTULA Left 09/12/2016   Procedure: BANDING OF LEFT  ARTERIOVENOUS  FISTULA;  Surgeon: Angelia Mould, MD;  Location: [place];  Service: Vascular;  Laterality: Left;   LOWER EXTREMITY INTERVENTION Right 12/02/2018   Procedure: LOWER EXTREMITY INTERVENTION;  Surgeon: Algernon Huxley, MD;  Location: [facility];  Service: Cardiovascular;  Laterality: Right;   POLYPECTOMY  06/05/2021   Procedure: POLYPECTOMY;  Surgeon: Sharyn Creamer, MD;  Location: [facility] ENDOSCOPY;  Service: Gastroenterology;;     Social  History   reports that he has been smoking cigarettes. He has a 20.00 pack-year smoking history. He has never used smokeless tobacco. He reports that he does not currently use alcohol after a past usage of about 21.0 standard drinks per week. He reports that he does not use drugs.   Family History  His family history includes Diabetes in his mother; Healthy in his father and sister; Kidney disease in his daughter; Kidney failure in his brother and mother. There is no history of Post-traumatic stress disorder, Bladder Cancer, or Kidney cancer.   Allergies Allergies  Allergen Reactions   Lisinopril Swelling     Home Medications  Prior to Admission medications   Medication Sig Start Date End Date Taking? Authorizing Provider  albuterol (VENTOLIN HFA) 108 (90 Base) MCG/ACT inhaler Inhale 2 puffs into the lungs every 4 (four) hours as needed for wheezing or shortness of breath. 06/05/21  Yes Danford, Suann Larry, MD  calcium acetate (PHOSLO) 667 MG capsule Take 2 capsules (1,334 mg total) by mouth 3 (three) times daily with meals. 06/05/21  Yes Danford, Suann Larry, MD  cinacalcet (SENSIPAR) 30 MG tablet Take 1 tablet (30 mg total) by mouth daily with breakfast. 06/05/21  Yes Danford, Suann Larry, MD  docusate sodium (COLACE) 100 MG capsule Take 1 capsule (100 mg total) by mouth 2 (two) times daily. 06/05/21  Yes Danford, Suann Larry, MD  epoetin alfa (EPOGEN) 4000 UNIT/ML injection Inject 1 mL (4,000 Units total) into the vein every Monday, Wednesday, and Friday with hemodialysis. 06/22/20  Yes Enzo Bi, MD  ferrous sulfate 325 (65 FE) MG tablet Take 1 tablet (325 mg total) by mouth 2 (two) times daily with a meal. 06/05/21  Yes Danford, Suann Larry, MD  hydrALAZINE (APRESOLINE) 50 MG tablet Take 1 tablet (50 mg total) by mouth 3 (three) times daily. 06/05/21  Yes Danford, Suann Larry, MD  pantoprazole (PROTONIX) 20 MG tablet Take 1 tablet (20 mg total) by mouth 2 (two) times daily before a  meal. 06/05/21 08/04/21 Yes Danford, Suann Larry, MD    Scheduled Meds:  sodium chloride   Intravenous Once   Chlorhexidine Gluconate Cloth  6 each Topical Q0600   dexamethasone (DECADRON) injection  4 mg Intravenous Q12H   dextrose  1 Tube Oral STAT   epoetin (EPOGEN/PROCRIT) injection  10,000 Units Intravenous Q M,W,F-HD   ferrous sulfate  325 mg Oral BID WC   heparin  5,000 Units Subcutaneous Q8H   ipratropium-albuterol  3 mL Nebulization Q6H   mouth rinse  15 mL Mouth Rinse BID   metoprolol tartrate  50 mg Oral BID   Continuous Infusions:  sodium chloride     famotidine (PEPCID) IV 20 mg (06/29/21 2121)   PRN Meds:.acetaminophen **OR** acetaminophen, albuterol, diphenhydrAMINE, lip balm  Assessment & Plan:   Acute on Chronic respiratory failure with hypoxia due to pulmonary edema from hypervolemia exacerbated by hypersensitivity reaction  Hx: COPD, Emphysema, tobacco abuse. -Extubated yesterday doing well on room air   Airway compromise with Oroligual swelling-resolved  - this was likely due to missing dialysis as things have substantially improved post renal replacement   Acute on chronic combined CHF Pericardial effusion likely in setting of uremia Hx of HTN Volume overload in the setting of missed HD s/p HD on admission 10/5 -Continuous cardiac monitoring -IV Lasix as blood pressure and renal function permits; currently on Lasix 40 mg IV BID -Hold BP meds in the setting of hypersensitivity reaction with potential hypotension -Cardiology consult -Repeat 2D Echocardiogram  Afib+RVR resolved -follow cardio team recommendatiosn -Consider weaning to oral Diltiazem 30-'90mg'$  PO qid while titrating dose, then transition to SA daily formulation  # Thromboembolism Risk Management, likely not a candidate for anticoagulation given noncompliance -Cardiology consult   ESRD on HD MWF Metabolic bone disease -Monitor I&O's / urinary output -  Follow BMP -Ensure adequate renal  perfusion -Replace electrolytes as indicated -Renal diet -Renally dose meds -Intermittent HD per renal- planning for tomorrow  Acute metabolic encephalopathy 2nd to hypoxia, uremia. ETOH Abuse high risk for withdrawal. -may need precedex with RASS goal 0 to -1 -Monitor CMP, INR, Daily BMP+Mg -CIWA symptom triggered PRNs -Daily Thiamine, Folate, MVI once tolerating PO -Insomnia Options: Trazodone if needed -SW consult for cessation resources -PT/OT evaluation for mobility  Anemia of critical illness and chronic disease. Hgb 7.3 appears to be at baseline -f/u CBC intermittently -transfuse If Hb < 7 or significant bleeding  -prbc 1 unit transfused   Best practice:  Diet:  Oral Pain/Anxiety/Delirium protocol (if indicated): No VAP protocol (if indicated): Not indicated DVT prophylaxis: Subcutaneous Heparin GI prophylaxis: PPI Glucose control:  SSI Yes Central venous access:  N/A Arterial line:  N/A Foley:  N/A Mobility:  bed rest  PT consulted: N/A Last date of multidisciplinary goals of care discussion [10/6] Code Status:  FULL Disposition: ICU   = Goals of Care = Code Status Order: FULL  Primary Emergency ContactPrecious, [place], Home Phone: [phone] Wishes to pursue full aggressive treatment and intervention options, including CPR and intubation, but goals of care will be addressed on going with family if that should become necessary.   Critical care provider statement:   Total critical care time: 33 minutes   Performed by: Lanney Gins MD   Critical care time was exclusive of separately billable procedures and treating other patients.   Critical care was necessary to treat or prevent imminent or life-threatening deterioration.   Critical care was time spent personally by me on the following activities: development of treatment plan with patient and/or surrogate as well as nursing, discussions with consultants, evaluation of patient's response to treatment,  examination of patient, obtaining history from patient or surrogate, ordering and performing treatments and interventions, ordering and review of laboratory studies, ordering and review of radiographic studies, pulse oximetry and re-evaluation of patient's condition.    Ottie Glazier, M.D.  Pulmonary & Critical Care Medicine      .

## 2021-06-30 NOTE — Progress Notes (Signed)
CSW spoke with patient to find out his living arrangements. Patient stated he lives by himself. CSW asked patient if he had any family or friends who can assist him with his care. Patient stated yes and he would have to ask his family. PT consult recommendations are pending.

## 2021-06-30 NOTE — Progress Notes (Signed)
Patient sleeping in chair. Has stated earlier he doesn't want nothing from respiratory tonight. Remains on 4 liters of oxygen.

## 2021-06-30 NOTE — Progress Notes (Signed)
SUBJECTIVE: Patient denies any chest pain or shortness of breath   Vitals:   06/30/21 0630 06/30/21 0645 06/30/21 0700 06/30/21 0816  BP: 111/74  113/73   Pulse: (!) 104     Resp: (!) 24 (!) 31 16   Temp:   98 F (36.7 C)   TempSrc:      SpO2: 92%   92%  Weight:      Height:        Intake/Output Summary (Last 24 hours) at 06/30/2021 1128 Last data filed at 06/29/2021 2009 Gross per 24 hour  Intake 37.38 ml  Output --  Net 37.38 ml    LABS: Basic Metabolic Panel: Recent Labs    06/29/21 0418 06/30/21 0537  NA 135 139  K 5.1 5.5*  CL 95* 97*  CO2 25 25  GLUCOSE 120* 41*  BUN 62* 89*  CREATININE 9.61* 10.53*  CALCIUM 8.7* 8.2*  MG 1.9 2.1  PHOS 8.2* 8.9*   Liver Function Tests: Recent Labs    06/30/21 0537  ALBUMIN 3.3*   No results for input(s): LIPASE, AMYLASE in the last 72 hours. CBC: Recent Labs    06/29/21 0418 06/30/21 0537  WBC 9.1 18.0*  NEUTROABS 8.4*  --   HGB 8.0* 8.0*  HCT 25.3* 25.8*  MCV 98.4 96.6  PLT 137* 130*   Cardiac Enzymes: No results for input(s): CKTOTAL, CKMB, CKMBINDEX, TROPONINI in the last 72 hours. BNP: Invalid input(s): POCBNP D-Dimer: No results for input(s): DDIMER in the last 72 hours. Hemoglobin A1C: No results for input(s): HGBA1C in the last 72 hours. Fasting Lipid Panel: Recent Labs    06/29/21 0418  TRIG 129   Thyroid Function Tests: No results for input(s): TSH, T4TOTAL, T3FREE, THYROIDAB in the last 72 hours.  Invalid input(s): FREET3 Anemia Panel: No results for input(s): VITAMINB12, FOLATE, FERRITIN, TIBC, IRON, RETICCTPCT in the last 72 hours.   PHYSICAL EXAM General: Well developed, well nourished, in no acute distress HEENT:  Normocephalic and atramatic Neck:  No JVD.  Lungs: Clear bilaterally to auscultation and percussion. Heart: HRRR . Normal S1 and S2 without gallops or murmurs.  Abdomen: Bowel sounds are positive, abdomen soft and non-tender  Msk:  Back normal, normal gait. Normal  strength and tone for age. Extremities: No clubbing, cyanosis or edema.   Neuro: Alert and oriented X 3. Psych:  Good affect, responds appropriately  TELEMETRY: Sinus tachycardia 105/min  ASSESSMENT AND PLAN: Paroxysmal atrial fibrillation with rapid ventricular response rate but currently in sinus sinus rhythm with sinus tachycardia.  Patient is extubated and feeling much better.  Elevated troponin due to demand ischemia and possibly due to renal failure.  Patient denies any chest pain.  Continue conservative therapy.  Active Problems:   ESRD on dialysis [facility])   Essential hypertension   Elevated troponin   Anemia in ESRD (end-stage renal disease) (HCC)   Acute on chronic respiratory failure with hypoxia (HCC)   Acute on chronic combined systolic and diastolic CHF (congestive heart failure) (HCC)   Atrial fibrillation with RVR (HCC)   Thrombocytopenia (HCC)   COPD (chronic obstructive pulmonary disease) (HCC)    Avalene Sealy A, MD, [facility] 06/30/2021 11:28 AM

## 2021-06-30 NOTE — Progress Notes (Signed)
CSW left a message for Angie at [facility] to see if she would be willing to accept patient for home health services. Due to patients insurance it will be difficult to find a home health provider.

## 2021-06-30 NOTE — Progress Notes (Signed)
Message sent to [facility] to find out if they are taking [employer] patients.

## 2021-06-30 NOTE — Evaluation (Signed)
Physical Therapy Evaluation Patient Details Name: Jordan [name] MRN: [id number] DOB: 1966/05/17 Today's Date: 06/30/2021  History of Present Illness  Patient is a 55 y.o with significant PMH of ESRD on HD MWF although noncompliant with HD, HTN, CHF, atrial fibrillation and COPD who presented to the ED with chief complaints of acute onset worsening dyspnea, chest discomfort, and abdominal distention after missing multiple dialysis session due to being incarcerated for 1 week. Admitted under hospitalist service for acute on chronic respiratory failure, CHF and A. fib with RVR. Patient required intubation but is now extubated.  Clinical Impression  Patient agreeable to PT, laying in bed watching TV. Patient reports he is independent with activity at baseline and lives alone.  Patient is demonstrating high level of mobility overall. He required Min guard assistance initially with ambulation progressing to supervision with increased ambulation distance. Mild shortness of breath and fatigue noted with activity. Recommend PT follow up to maximize independence and return to prior level of function. Recommend HHPT at discharge and intermittent supervision (patient states family can check on him).       Recommendations for follow up therapy are one component of a multi-disciplinary discharge planning process, led by the attending physician.  Recommendations may be updated based on patient status, additional functional criteria and insurance authorization.  Follow Up Recommendations Home health PT;Supervision - Intermittent    Equipment Recommendations  None recommended by PT    Recommendations for Other Services       Precautions / Restrictions Precautions Precautions: Fall Restrictions Weight Bearing Restrictions: No      Mobility  Bed Mobility Overal bed mobility: Modified Independent                  Transfers Overall transfer level: Needs assistance Equipment used:  None Transfers: Sit to/from Stand Sit to Stand: Supervision         General transfer comment: supervision for safety. 3 bouts of standing performed. no physical assistance required for lifting  Ambulation/Gait Ambulation/Gait assistance: Supervision;Min guard Gait Distance (Feet): 50 Feet Assistive device: None Gait Pattern/deviations: Narrow base of support Gait velocity: decreased   General Gait Details: mild unsteadiness initially with walking that improved with cues for safety. mild dizziness reported initially with standing. heart rate around 110 with walking  Stairs            Wheelchair Mobility    Modified Rankin (Stroke Patients Only)       Balance Overall balance assessment: Needs assistance Sitting-balance support: Feet supported Sitting balance-Leahy Scale: Good     Standing balance support: No upper extremity supported Standing balance-Leahy Scale: Fair                               Pertinent Vitals/Pain Pain Assessment: Faces Faces Pain Scale: Hurts a little bit Pain Location: sore throat Pain Descriptors / Indicators: Discomfort Pain Intervention(s): Limited activity within patient's tolerance    Home Living Family/patient expects to be discharged to:: Private residence Living Arrangements: Alone Available Help at Discharge: Family;Available PRN/intermittently Type of Home: House Home Access:  (patient reports no steps at home)     Home Layout: One level        Prior Function Level of Independence: Independent               Hand Dominance        Extremity/Trunk Assessment   Upper Extremity Assessment Upper Extremity Assessment: Overall WFL for  tasks assessed    Lower Extremity Assessment Lower Extremity Assessment: Overall WFL for tasks assessed       Communication   Communication: No difficulties  Cognition Arousal/Alertness: Awake/alert Behavior During Therapy: WFL for tasks  assessed/performed Overall Cognitive Status: Within Functional Limits for tasks assessed                                        General Comments General comments (skin integrity, edema, etc.): educated patient on building activity slowly    Exercises     Assessment/Plan    PT Assessment Patient needs continued PT services  PT Problem List Decreased activity tolerance;Decreased balance;Decreased mobility;Decreased strength       PT Treatment Interventions DME instruction;Gait training;Stair training;Functional mobility training;Therapeutic activities;Therapeutic exercise;Neuromuscular re-education;Balance training;Patient/family education;Cognitive remediation    PT Goals (Current goals can be found in the Care Plan section)  Acute Rehab PT Goals Patient Stated Goal: to go home PT Goal Formulation: With patient Time For Goal Achievement: 07/14/21 Potential to Achieve Goals: Good    Frequency Min 2X/week   Barriers to discharge        Co-evaluation               AM-PAC PT "6 Clicks" Mobility  Outcome Measure Help needed turning from your back to your side while in a flat bed without using bedrails?: None Help needed moving from lying on your back to sitting on the side of a flat bed without using bedrails?: A Little Help needed moving to and from a bed to a chair (including a wheelchair)?: A Little Help needed standing up from a chair using your arms (e.g., wheelchair or bedside chair)?: A Little Help needed to walk in hospital room?: A Little Help needed climbing 3-5 steps with a railing? : A Little 6 Click Score: 19    End of Session   Activity Tolerance: Patient tolerated treatment well Patient left: in bed;with call bell/phone within reach;with bed alarm set Nurse Communication: Mobility status PT Visit Diagnosis: Unsteadiness on feet (R26.81);Muscle weakness (generalized) (M62.81)    Time: [id number] PT Time Calculation (min) (ACUTE ONLY):  15 min   Charges:   PT Evaluation $PT Eval Low Complexity: 1 Low PT Treatments $Therapeutic Activity: 8-22 mins        [name], PT, MPT   [name] 06/30/2021, 11:44 AM

## 2021-06-30 NOTE — Progress Notes (Signed)
[facility]  ROUNDING NOTE   Subjective:   [name] was admitted to [facility] on 06/26/2021 for End stage renal disease ([place]) [N18.6] Atrial fibrillation with RVR ([place]) [I48.91]  With acute respiratory failure requiring intubated and sedated. Extubated yesterday. Now on 4 liters nasal canula.   Weaned off norepinephrine.  Patient complains of itching.   WBC elevated this morning  Objective:  Vital signs in last 24 hours:  Temp:  [97.8 F (36.6 C)-98 F (36.7 C)] 98 F (36.7 C) (10/09 0700) Pulse Rate:  [82-109] 104 (10/09 0630) Resp:  [14-31] 16 (10/09 0700) BP: (108-138)/(64-93) 113/73 (10/09 0700) SpO2:  [90 %-99 %] 92 % (10/09 0816) FiO2 (%):  [30 %] 30 % (10/08 1340)  Weight change:  Filed Weights   06/28/21 0500 06/28/21 0920 06/29/21 0235  Weight: 79 kg 79 kg 79.5 kg    Intake/Output: I/O last 3 completed shifts: In: 493.8 [I.V.:493.8] Out: -    Intake/Output this shift:  No intake/output data recorded.  Physical Exam: General: ill  Head: ETT  Eyes: Anicteric,   Neck: trachea midline  Lungs:  Diminished bilaterally, 4L [place] O2  Heart: +tachycardia  Abdomen:  Soft  Extremities:  no peripheral edema.  Neurologic: Alert and oriented  Skin: Multiple abrasions  Access: Left AVF    Basic Metabolic Panel: Recent Labs  Lab 06/26/21 0804 06/26/21 1725 06/27/21 2129 06/28/21 0547 06/28/21 1555 06/29/21 0418 06/30/21 0537  NA 145   < > 140 137 136 135 139  K 6.1*   < > 5.1 5.6* 4.5 5.1 5.5*  CL 102   < > 97* 98 94* 95* 97*  CO2 20*   < > '23 23 26 25 25  '$ GLUCOSE 95   < > 122* 129* 107* 120* 41*  BUN 107*   < > 88* 92* 55* 62* 89*  CREATININE 18.65*   < > 13.08* 12.90* 8.49* 9.61* 10.53*  CALCIUM 9.4   < > 8.6* 8.5* 8.4* 8.7* 8.2*  MG 2.5*  --  2.2 2.0  --  1.9 2.1  PHOS 9.6*  --   --   --   --  8.2* 8.9*   < > = values in this interval not displayed.     Liver Function Tests: Recent Labs  Lab 06/26/21 0804 06/30/21 0537   AST 20  --   ALT 14  --   ALKPHOS 102  --   BILITOT 1.6*  --   PROT 8.8*  --   ALBUMIN 3.9 3.3*    No results for input(s): LIPASE, AMYLASE in the last 168 hours. No results for input(s): AMMONIA in the last 168 hours.  CBC: Recent Labs  Lab 06/26/21 0804 06/27/21 0607 06/27/21 2129 06/28/21 0547 06/28/21 1555 06/29/21 0418 06/30/21 0537  WBC 9.6 8.1 8.8 5.4  --  9.1 18.0*  NEUTROABS 7.6  --   --   --   --  8.4*  --   HGB 7.1* 7.3* 7.3* 6.2* 7.6* 8.0* 8.0*  HCT 23.0* 23.1* 24.1* 20.0* 24.3* 25.3* 25.8*  MCV 101.8* 102.7* 100.4* 99.5  --  98.4 96.6  PLT 150 144* 150 116*  --  137* 130*     Cardiac Enzymes: No results for input(s): CKTOTAL, CKMB, CKMBINDEX, TROPONINI in the last 168 hours.  BNP: Invalid input(s): POCBNP  CBG: Recent Labs  Lab 06/29/21 1138 06/29/21 1553 06/29/21 2042 06/30/21 0710 06/30/21 0854  GLUCAP 98 112* 345* 37* 80  Microbiology: Results for orders placed or performed during the hospital encounter of 06/26/21  Resp Panel by RT-PCR (Flu A&B, Covid) Nasopharyngeal Swab     Status: None   Collection Time: 06/26/21  8:04 AM   Specimen: Nasopharyngeal Swab; Nasopharyngeal(NP) swabs in vial transport medium  Result Value Ref Range Status   SARS Coronavirus 2 by RT PCR NEGATIVE NEGATIVE Final    Comment: (NOTE) SARS-CoV-2 target nucleic acids are NOT DETECTED.  The SARS-CoV-2 RNA is generally detectable in upper respiratory specimens during the acute phase of infection. The lowest concentration of SARS-CoV-2 viral copies this assay can detect is 138 copies/mL. A negative result does not preclude SARS-Cov-2 infection and should not be used as the sole basis for treatment or other patient management decisions. A negative result may occur with  improper specimen collection/handling, submission of specimen other than nasopharyngeal swab, presence of viral mutation(s) within the areas targeted by this assay, and inadequate number of  viral copies(<138 copies/mL). A negative result must be combined with clinical observations, patient history, and epidemiological information. The expected result is Negative.  Fact Sheet for Patients:  EntrepreneurPulse.com.au  Fact Sheet for Healthcare Providers:  IncredibleEmployment.be  This test is no t yet approved or cleared by the [place] FDA and  has been authorized for detection and/or diagnosis of SARS-CoV-2 by FDA under an Emergency Use Authorization (EUA). This EUA will remain  in effect (meaning this test can be used) for the duration of the COVID-19 declaration under Section 564(b)(1) of the Act, 21 U.S.C.section 360bbb-3(b)(1), unless the authorization is terminated  or revoked sooner.       Influenza A by PCR NEGATIVE NEGATIVE Final   Influenza B by PCR NEGATIVE NEGATIVE Final    Comment: (NOTE) The Xpert Xpress SARS-CoV-2/FLU/RSV plus assay is intended as an aid in the diagnosis of influenza from Nasopharyngeal swab specimens and should not be used as a sole basis for treatment. Nasal washings and aspirates are unacceptable for Xpert Xpress SARS-CoV-2/FLU/RSV testing.  Fact Sheet for Patients: EntrepreneurPulse.com.au  Fact Sheet for Healthcare Providers: IncredibleEmployment.be  This test is not yet approved or cleared by the [place] FDA and has been authorized for detection and/or diagnosis of SARS-CoV-2 by FDA under an Emergency Use Authorization (EUA). This EUA will remain in effect (meaning this test can be used) for the duration of the COVID-19 declaration under Section 564(b)(1) of the Act, 21 U.S.C. section 360bbb-3(b)(1), unless the authorization is terminated or revoked.  Performed at [facility], [address] [place], [place] [address]   Culture, Respiratory w Gram Stain     Status: None (Preliminary result)   Collection Time: 06/28/21  4:00 PM    Specimen: Tracheal Aspirate; Respiratory  Result Value Ref Range Status   Specimen Description   Final    TRACHEAL ASPIRATE Performed at [facility], [address], [place], [place] [address]    Special Requests   Final    NONE Performed at [facility], [address] [place], [place] [address]    Gram Stain   Final    NO SQUAMOUS EPITHELIAL CELLS SEEN ABUNDANT WBC PRESENT,BOTH PMN AND MONONUCLEAR ABUNDANT GRAM POSITIVE COCCI IN CHAINS IN CLUSTERS MODERATE GRAM NEGATIVE RODS RARE GRAM POSITIVE RODS    Culture   Final    TOO YOUNG TO READ Performed at [facility], [place] [address], [place], [place] [address]    Report Status PENDING  Incomplete    Coagulation Studies: No  results for input(s): LABPROT, INR in the last 72 hours.  Urinalysis: No results for input(s): COLORURINE, LABSPEC, PHURINE, GLUCOSEU, HGBUR, BILIRUBINUR, KETONESUR, PROTEINUR, UROBILINOGEN, NITRITE, LEUKOCYTESUR in the last 72 hours.  Invalid input(s): APPERANCEUR    Imaging: No results found.   Medications:    sodium chloride     famotidine (PEPCID) IV 20 mg (06/29/21 2121)    sodium chloride   Intravenous Once   Chlorhexidine Gluconate Cloth  6 each Topical Q0600   dexamethasone (DECADRON) injection  4 mg Intravenous Q12H   dextrose  1 Tube Oral STAT   epoetin (EPOGEN/PROCRIT) injection  10,000 Units Intravenous Q M,W,F-HD   ferrous sulfate  325 mg Oral BID WC   insulin aspart  0-20 Units Subcutaneous TID AC & HS   ipratropium-albuterol  3 mL Nebulization Q6H   mouth rinse  15 mL Mouth Rinse BID     Assessment/ Plan:  [name] is a 55 y.o. black male with end stage renal disease on hemodialysis, hypertension, COPD, EtOH abuse, atrial fibrillation, congestive heart failure who is admitted to [facility] on End stage renal disease ([place]) [N18.6] Atrial fibrillation with RVR ([place]) [I48.91]   [facility] MWF [facility]. Left AVF 67kg  End Stage  Renal Disease: hyperkalemia, pulmonary edema and volume overload on admission.  - Monitor daily for dialysis need. No indication for dialysis today. Dialysis for tomorrow and continue MWF schedule.   Hypotension: with hypertensive urgency with atrial fibrillation with rapid ventricular response on admission. Converted back to sinus rhythm.   - weaned off norepinephrine - holding home blood pressure agents.   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 - restart phos binders: calcium acetate - Restart cinacalcet.   Acute respiratory failure: requiring intubation and mechanical ventilation. Secondary to pulmonary edema. Improved with dialysis.   Anemia with chronic kidney disease: hemoglobin 8. status post PRBC transfusion on 10/7. Not getting outpatient ESA. Iron studies from 9/12 are acceptable.  - ESA with HD treatment     LOS: 4 Alley Neils 10/9/20229:34 AM

## 2021-06-30 NOTE — Progress Notes (Signed)
CSW left a message with Lauren with [facility] requesting a call back

## 2021-06-30 NOTE — Progress Notes (Signed)
PROGRESS NOTE    Jordan [name]  [id number] DOB: 1965/09/26 DOA: 06/26/2021 PCP: Pcp, No   Chief complaint.  Shortness of breath. Brief Narrative:  55 y.o with significant PMH of ESRD on HD MWF although noncompliant with HD, HTN, CHF, atrial fibrillation and COPD who presented to the ED with chief complaints of acute onset worsening dyspnea, chest discomfort, and abdominal distention after missing multiple dialysis session due to being incarcerated for 1 week. 10/5: Admitted under hospitalist service for acute on chronic respiratory failure, CHF and A. fib with RVR 10/6: Rapid response called for anaphylactic/hypersensitivity reaction.  Patient transferred to the ICU.  PCCM consulted 10/7- patient on vent 70% and levophed, orolingual swelling improved post renal replacement therapy. patient is imrpoved post HD, tongue is 50% less swollen pos dialysis today.  06/29/21- Patient improved, swelling resolved, +cuff leak an dweaned to 30%FiO2 on MV, plan for SBT and liberation from vent today.   Assessment & Plan:   Active Problems:   Atrial fibrillation with RVR (HCC)  Acute on chronic respiratory failure with hypoxemia secondary to pulmonary edema. Acute on chronic combined systolic and diastolic congestive heart failure. Patient condition appears to be back to baseline, he is currently on 4 L oxygen. Patient is seen by nephrology, receiving hemodialysis. Patient is also followed by cardiology.  Paroxysmal atrial fibrillation with RVR. Heart rate still fast at this point, added metoprolol 50 mg twice a day.  Patient blood pressure has been within normal limits.  Hypoglycemia. Patient had glucose of 37 this morning, will discontinue sliding scale insulin.  Continue to follow.  Acute metabolic encephalopathy. Alcohol abuse. Condition seem to have improved. Continue to follow for withdrawal.  Anemia of chronic kidney disease. End-stage renal disease with hyperkalemia Patient  required 1 unit PRBC, continue to follow. Potassium has normalized after dialysis.  DVT prophylaxis: Heparin Code Status: Partial Family Communication:  Disposition Plan:    Status is: Inpatient  Remains inpatient appropriate because:Inpatient level of care appropriate due to severity of illness  Dispo: The patient is from: Home              Anticipated d/c is to: Home              Patient currently is not medically stable to d/c.   Difficult to place patient No   Will obtain PT, TOC consult to prepare for discharge tomorrow.     I/O last 3 completed shifts: In: 493.8 [I.V.:493.8] Out: -  No intake/output data recorded.     Consultants:  Nephrology  Procedures: HD  Antimicrobials: None  Subjective: Patient has some soft stool this morning, not watery. No abdominal pain or nausea vomiting. No short of breath or cough, on 4 L oxygen. No fever or chills. No dysuria hematuria.  Objective: Vitals:   06/30/21 0630 06/30/21 0645 06/30/21 0700 06/30/21 0816  BP: 111/74  113/73   Pulse: (!) 104     Resp: (!) 24 (!) 31 16   Temp:   98 F (36.7 C)   TempSrc:      SpO2: 92%   92%  Weight:      Height:        Intake/Output Summary (Last 24 hours) at 06/30/2021 0944 Last data filed at 06/29/2021 2009 Gross per 24 hour  Intake 37.38 ml  Output --  Net 37.38 ml   Filed Weights   06/28/21 0500 06/28/21 0920 06/29/21 0235  Weight: 79 kg 79 kg 79.5 kg    Examination:  General exam: Appears calm and comfortable  Respiratory system: Clear to auscultation. Respiratory effort normal. Cardiovascular system: S1 & S2 heard, RRR. No JVD, murmurs, rubs, gallops or clicks. No pedal edema. Gastrointestinal system: Abdomen is nondistended, soft and nontender. No organomegaly or masses felt. Normal bowel sounds heard. Central nervous system: Alert and oriented x3. No focal neurological deficits. Extremities: Symmetric 5 x 5 power. Skin: No rashes, lesions or  ulcers Psychiatry:  Mood & affect appropriate.     Data Reviewed: I have personally reviewed following labs and imaging studies  CBC: Recent Labs  Lab 06/26/21 0804 06/27/21 0607 06/27/21 2129 06/28/21 0547 06/28/21 1555 06/29/21 0418 06/30/21 0537  WBC 9.6 8.1 8.8 5.4  --  9.1 18.0*  NEUTROABS 7.6  --   --   --   --  8.4*  --   HGB 7.1* 7.3* 7.3* 6.2* 7.6* 8.0* 8.0*  HCT 23.0* 23.1* 24.1* 20.0* 24.3* 25.3* 25.8*  MCV 101.8* 102.7* 100.4* 99.5  --  98.4 96.6  PLT 150 144* 150 116*  --  137* [id number]*   Basic Metabolic Panel: Recent Labs  Lab 06/26/21 0804 06/26/21 1725 06/27/21 2129 06/28/21 0547 06/28/21 1555 06/29/21 0418 06/30/21 0537  NA 145   < > 140 137 136 135 139  K 6.1*   < > 5.1 5.6* 4.5 5.1 5.5*  CL 102   < > 97* 98 94* 95* 97*  CO2 20*   < > '23 23 26 25 25  '$ GLUCOSE 95   < > 122* 129* 107* 120* 41*  BUN 107*   < > 88* 92* 55* 62* 89*  CREATININE 18.65*   < > 13.08* 12.90* 8.49* 9.61* 10.53*  CALCIUM 9.4   < > 8.6* 8.5* 8.4* 8.7* 8.2*  MG 2.5*  --  2.2 2.0  --  1.9 2.1  PHOS 9.6*  --   --   --   --  8.2* 8.9*   < > = values in this interval not displayed.   GFR: Estimated Creatinine Clearance: 7.7 mL/min (A) (by C-G formula based on SCr of 10.53 mg/dL (H)). Liver Function Tests: Recent Labs  Lab 06/26/21 0804 06/30/21 0537  AST 20  --   ALT 14  --   ALKPHOS 102  --   BILITOT 1.6*  --   PROT 8.8*  --   ALBUMIN 3.9 3.3*   No results for input(s): LIPASE, AMYLASE in the last 168 hours. No results for input(s): AMMONIA in the last 168 hours. Coagulation Profile: No results for input(s): INR, PROTIME in the last 168 hours. Cardiac Enzymes: No results for input(s): CKTOTAL, CKMB, CKMBINDEX, TROPONINI in the last 168 hours. BNP (last 3 results) No results for input(s): PROBNP in the last 8760 hours. HbA1C: No results for input(s): HGBA1C in the last 72 hours. CBG: Recent Labs  Lab 06/29/21 1138 06/29/21 1553 06/29/21 2042 06/30/21 0710  06/30/21 0854  GLUCAP 98 112* 345* 37* 80   Lipid Profile: Recent Labs    06/29/21 0418  TRIG 129   Thyroid Function Tests: No results for input(s): TSH, T4TOTAL, FREET4, T3FREE, THYROIDAB in the last 72 hours. Anemia Panel: No results for input(s): VITAMINB12, FOLATE, FERRITIN, TIBC, IRON, RETICCTPCT in the last 72 hours. Sepsis Labs: Recent Labs  Lab 06/26/21 0804  LATICACIDVEN 1.6    Recent Results (from the past 240 hour(s))  Resp Panel by RT-PCR (Flu A&B, Covid) Nasopharyngeal Swab     Status: None   Collection Time: 06/26/21  8:04 AM   Specimen: Nasopharyngeal Swab; Nasopharyngeal(NP) swabs in vial transport medium  Result Value Ref Range Status   SARS Coronavirus 2 by RT PCR NEGATIVE NEGATIVE Final    Comment: (NOTE) SARS-CoV-2 target nucleic acids are NOT DETECTED.  The SARS-CoV-2 RNA is generally detectable in upper respiratory specimens during the acute phase of infection. The lowest concentration of SARS-CoV-2 viral copies this assay can detect is 138 copies/mL. A negative result does not preclude SARS-Cov-2 infection and should not be used as the sole basis for treatment or other patient management decisions. A negative result may occur with  improper specimen collection/handling, submission of specimen other than nasopharyngeal swab, presence of viral mutation(s) within the areas targeted by this assay, and inadequate number of viral copies(<138 copies/mL). A negative result must be combined with clinical observations, patient history, and epidemiological information. The expected result is Negative.  Fact Sheet for Patients:  EntrepreneurPulse.com.au  Fact Sheet for Healthcare Providers:  IncredibleEmployment.be  This test is no t yet approved or cleared by the [place] FDA and  has been authorized for detection and/or diagnosis of SARS-CoV-2 by FDA under an Emergency Use Authorization (EUA). This EUA will remain   in effect (meaning this test can be used) for the duration of the COVID-19 declaration under Section 564(b)(1) of the Act, 21 U.S.C.section 360bbb-3(b)(1), unless the authorization is terminated  or revoked sooner.       Influenza A by PCR NEGATIVE NEGATIVE Final   Influenza B by PCR NEGATIVE NEGATIVE Final    Comment: (NOTE) The Xpert Xpress SARS-CoV-2/FLU/RSV plus assay is intended as an aid in the diagnosis of influenza from Nasopharyngeal swab specimens and should not be used as a sole basis for treatment. Nasal washings and aspirates are unacceptable for Xpert Xpress SARS-CoV-2/FLU/RSV testing.  Fact Sheet for Patients: EntrepreneurPulse.com.au  Fact Sheet for Healthcare Providers: IncredibleEmployment.be  This test is not yet approved or cleared by the [place] FDA and has been authorized for detection and/or diagnosis of SARS-CoV-2 by FDA under an Emergency Use Authorization (EUA). This EUA will remain in effect (meaning this test can be used) for the duration of the COVID-19 declaration under Section 564(b)(1) of the Act, 21 U.S.C. section 360bbb-3(b)(1), unless the authorization is terminated or revoked.  Performed at [facility], [address] [place], [place] [address]   Culture, Respiratory w Gram Stain     Status: None (Preliminary result)   Collection Time: 06/28/21  4:00 PM   Specimen: Tracheal Aspirate; Respiratory  Result Value Ref Range Status   Specimen Description   Final    TRACHEAL ASPIRATE Performed at [facility], [address] [place], [place] [address]    Special Requests   Final    NONE Performed at [facility], [address] [place], [place] [address]    Gram Stain   Final    NO SQUAMOUS EPITHELIAL CELLS SEEN ABUNDANT WBC PRESENT,BOTH PMN AND MONONUCLEAR ABUNDANT GRAM POSITIVE COCCI IN CHAINS IN CLUSTERS MODERATE GRAM NEGATIVE RODS RARE GRAM POSITIVE RODS     Culture   Final    TOO YOUNG TO READ Performed at [facility], [place] [address] [place], [place] [address]    Report Status PENDING  Incomplete         Radiology Studies: No results found.      Scheduled Meds:  sodium chloride   Intravenous Once   Chlorhexidine Gluconate Cloth  6 each Topical Q0600   dexamethasone (DECADRON)  injection  4 mg Intravenous Q12H   dextrose  1 Tube Oral STAT   epoetin (EPOGEN/PROCRIT) injection  10,000 Units Intravenous Q M,W,F-HD   ferrous sulfate  325 mg Oral BID WC   insulin aspart  0-20 Units Subcutaneous TID AC & HS   ipratropium-albuterol  3 mL Nebulization Q6H   mouth rinse  15 mL Mouth Rinse BID   Continuous Infusions:  sodium chloride     famotidine (PEPCID) IV 20 mg (06/29/21 2121)     LOS: 4 days    Time spent: 32 minutes    Sharen Hones, MD [facility]   To contact the attending provider between 7A-7P or the covering provider during after hours 7P-7A, please log into the web site [email or web address] and access using universal [place] password for that web site. If you do not have the password, please call the hospital operator.  06/30/2021, 9:44 AM

## 2021-07-01 DIAGNOSIS — D631 Anemia in chronic kidney disease: Secondary | ICD-10-CM

## 2021-07-01 DIAGNOSIS — E875 Hyperkalemia: Secondary | ICD-10-CM | POA: Diagnosis not present

## 2021-07-01 DIAGNOSIS — I5043 Acute on chronic combined systolic (congestive) and diastolic (congestive) heart failure: Secondary | ICD-10-CM | POA: Diagnosis not present

## 2021-07-01 DIAGNOSIS — N186 End stage renal disease: Secondary | ICD-10-CM | POA: Diagnosis not present

## 2021-07-01 LAB — CBC WITH DIFFERENTIAL/PLATELET
Abs Immature Granulocytes: 0.37 10*3/uL — ABNORMAL HIGH (ref 0.00–0.07)
Basophils Absolute: 0 10*3/uL (ref 0.0–0.1)
Basophils Relative: 0 %
Eosinophils Absolute: 0 10*3/uL (ref 0.0–0.5)
Eosinophils Relative: 0 %
HCT: 26.8 % — ABNORMAL LOW (ref 39.0–52.0)
Hemoglobin: 8.9 g/dL — ABNORMAL LOW (ref 13.0–17.0)
Immature Granulocytes: 3 %
Lymphocytes Relative: 2 %
Lymphs Abs: 0.3 10*3/uL — ABNORMAL LOW (ref 0.7–4.0)
MCH: 31.2 pg (ref 26.0–34.0)
MCHC: 33.2 g/dL (ref 30.0–36.0)
MCV: 94 fL (ref 80.0–100.0)
Monocytes Absolute: 0.7 10*3/uL (ref 0.1–1.0)
Monocytes Relative: 5 %
Neutro Abs: 13.3 10*3/uL — ABNORMAL HIGH (ref 1.7–7.7)
Neutrophils Relative %: 90 %
Platelets: 155 10*3/uL (ref 150–400)
RBC: 2.85 MIL/uL — ABNORMAL LOW (ref 4.22–5.81)
RDW: 15.7 % — ABNORMAL HIGH (ref 11.5–15.5)
WBC: 14.8 10*3/uL — ABNORMAL HIGH (ref 4.0–10.5)
nRBC: 0.2 % (ref 0.0–0.2)

## 2021-07-01 LAB — BASIC METABOLIC PANEL
Anion gap: 24 — ABNORMAL HIGH (ref 5–15)
BUN: 99 mg/dL — ABNORMAL HIGH (ref 6–20)
CO2: 17 mmol/L — ABNORMAL LOW (ref 22–32)
Calcium: 8.4 mg/dL — ABNORMAL LOW (ref 8.9–10.3)
Chloride: 98 mmol/L (ref 98–111)
Creatinine, Ser: 11.83 mg/dL — ABNORMAL HIGH (ref 0.61–1.24)
GFR, Estimated: 5 mL/min — ABNORMAL LOW (ref >60)
Glucose, Bld: 111 mg/dL — ABNORMAL HIGH (ref 70–99)
Potassium: 6.3 mmol/L (ref 3.5–5.1)
Sodium: 139 mmol/L (ref 135–145)

## 2021-07-01 LAB — POTASSIUM: Potassium: 4.1 mmol/L (ref 3.5–5.1)

## 2021-07-01 LAB — GLUCOSE, CAPILLARY
Glucose-Capillary: 91 mg/dL (ref 70–99)
Glucose-Capillary: 95 mg/dL (ref 70–99)
Glucose-Capillary: 115 mg/dL — ABNORMAL HIGH (ref 70–99)
Glucose-Capillary: 95 mg/dL (ref 70–99)

## 2021-07-01 LAB — C1 ESTERASE INHIBITOR: C1INH SerPl-mCnc: 42 mg/dL — ABNORMAL HIGH (ref 21–39)

## 2021-07-01 MED ORDER — DIPHENHYDRAMINE HCL 25 MG PO CAPS
25.0000 mg | ORAL_CAPSULE | Freq: Four times a day (QID) | ORAL | Status: DC | PRN
  Administered 2021-07-01 – 2021-07-05 (×2): 25 mg via ORAL
  Filled 2021-07-01 (×3): qty 1

## 2021-07-01 MED ORDER — HYDROCORTISONE (PERIANAL) 2.5 % EX CREA
TOPICAL_CREAM | Freq: Three times a day (TID) | CUTANEOUS | Status: DC
  Filled 2021-07-01: qty 28.35

## 2021-07-01 NOTE — Progress Notes (Signed)
Pt states hes finsihed with his iv and purposefully removed it,,unwilling to allow replacement

## 2021-07-01 NOTE — Progress Notes (Signed)
Pt. with shorten tx; 69 fld removal; AVF functioned well maintained BFR at prescribed rate. Patient with urgent request to defecate,climbs out of bed and defecates on floor. Patient assisted back to bed, noted blood in bedpan and on linen. NP in suite and exams. Patient removed all monitoring devices, terminated session.Cleaned returned to ICU.

## 2021-07-01 NOTE — Progress Notes (Signed)
Physical Therapy Treatment Patient Details Name: Jordan [name] MRN: [id number] DOB: Sep 07, 1966 Today's Date: 07/01/2021   History of Present Illness Patient is a 55 y.o with significant PMH of ESRD on HD MWF although noncompliant with HD, HTN, CHF, atrial fibrillation and COPD who presented to the ED with chief complaints of acute onset worsening dyspnea, chest discomfort, and abdominal distention after missing multiple dialysis session due to being incarcerated for 1 week. Admitted under hospitalist service for acute on chronic respiratory failure, CHF and A. fib with RVR. Patient required intubation but is now extubated.    PT Comments    Patient attempting to get out of bed on arrival to room, taking off his oxygen and requesting to eat lunch. Patient encouraged to call for assistance for line management and fall prevention. Patient was agreeable to get out of bed to chair for lunch. He ambulated a short distance in the room with supervision, heart rate in the 80's. Further activity limited as patient requesting to eat and is fatigued with minimal activity. Recommend HHPT at discharge if possible. PT will continue to follow to maximize independence and facilitate return to prior level of function.    Recommendations for follow up therapy are one component of a multi-disciplinary discharge planning process, led by the attending physician.  Recommendations may be updated based on patient status, additional functional criteria and insurance authorization.  Follow Up Recommendations  Home health PT;Supervision - Intermittent     Equipment Recommendations  None recommended by PT    Recommendations for Other Services       Precautions / Restrictions Precautions Precautions: Fall Restrictions Weight Bearing Restrictions: No     Mobility  Bed Mobility Overal bed mobility: Modified Independent             General bed mobility comments: patient attempting to get out of bed on  arrival to room, taking oxygen off, telemetry lines coming off. patient reports he was trying to get to his lunch (was on the other side of the bed). cues provided for safety and need to call for assistance before mobilizing for fall prevention and management of lines.    Transfers Overall transfer level: Needs assistance Equipment used: None Transfers: Sit to/from Stand Sit to Stand: Supervision         General transfer comment: again, no physical assistance is required for lifting with transfers. patient needs supervision for safety and is mildly impulsive with activity  Ambulation/Gait Ambulation/Gait assistance: Supervision Gait Distance (Feet): 2 Feet Assistive device: None Gait Pattern/deviations: Step-through pattern     General Gait Details: no loss of balance while ambulating a short distance in room. heart rate in the 80's with activity. further activity limited as patient wants to eat lunch. unable to get Sp02 reading with activity. patient does appear fatigued with minimal activity and states he does not feel as well as he did yesterday   Stairs             Wheelchair Mobility    Modified Rankin (Stroke Patients Only)       Balance Overall balance assessment: Needs assistance Sitting-balance support: Feet supported Sitting balance-Leahy Scale: Good     Standing balance support: No upper extremity supported Standing balance-Leahy Scale: Fair                              Cognition Arousal/Alertness: Awake/alert Behavior During Therapy: Impulsive Overall Cognitive Status: Within Functional Limits for  tasks assessed                                 General Comments: patient impulsive at times with activity.      Exercises      General Comments General comments (skin integrity, edema, etc.): patient set-up with lunch and feeding himself at end of session in the recliner chair. educated patient to call for assistance before  getting up with call bell in reach      Pertinent Vitals/Pain Pain Assessment: No/denies pain    Home Living                      Prior Function            PT Goals (current goals can now be found in the care plan section) Acute Rehab PT Goals Patient Stated Goal: to go home PT Goal Formulation: With patient Time For Goal Achievement: 07/14/21 Potential to Achieve Goals: Good Progress towards PT goals: Progressing toward goals    Frequency    Min 2X/week      PT Plan Current plan remains appropriate    Co-evaluation              AM-PAC PT "6 Clicks" Mobility   Outcome Measure  Help needed turning from your back to your side while in a flat bed without using bedrails?: None Help needed moving from lying on your back to sitting on the side of a flat bed without using bedrails?: A Little Help needed moving to and from a bed to a chair (including a wheelchair)?: A Little Help needed standing up from a chair using your arms (e.g., wheelchair or bedside chair)?: A Little Help needed to walk in hospital room?: A Little Help needed climbing 3-5 steps with a railing? : A Little 6 Click Score: 19    End of Session Equipment Utilized During Treatment: Oxygen Activity Tolerance: Patient limited by fatigue Patient left: in chair;with call bell/phone within reach Nurse Communication: Mobility status PT Visit Diagnosis: Unsteadiness on feet (R26.81);Muscle weakness (generalized) (M62.81)     Time: [id number] PT Time Calculation (min) (ACUTE ONLY): 12 min  Charges:  $Therapeutic Activity: 8-22 mins                     Jordan [name], PT, MPT    Jordan [name] 07/01/2021, 3:11 PM

## 2021-07-01 NOTE — Progress Notes (Signed)
SUBJECTIVE: Patient denies any chest pain or shortness of breath   Vitals:   07/01/21 0700 07/01/21 0800 07/01/21 0900 07/01/21 0904  BP: (!) 133/92 127/83 (!) 124/95   Pulse: 85  93   Resp: 16 20 (!) 21 (!) 36  Temp:  98.2 F (36.8 C)    TempSrc:      SpO2: 100%  100%   Weight:      Height:       No intake or output data in the 24 hours ending 07/01/21 0928  LABS: Basic Metabolic Panel: Recent Labs    06/29/21 0418 06/30/21 0537 07/01/21 0535  NA 135 139 139  K 5.1 5.5* 6.3*  CL 95* 97* 98  CO2 25 25 17*  GLUCOSE 120* 41* 111*  BUN 62* 89* 99*  CREATININE 9.61* 10.53* 11.83*  CALCIUM 8.7* 8.2* 8.4*  MG 1.9 2.1  --   PHOS 8.2* 8.9*  --    Liver Function Tests: Recent Labs    06/30/21 0537  ALBUMIN 3.3*   No results for input(s): LIPASE, AMYLASE in the last 72 hours. CBC: Recent Labs    06/29/21 0418 06/30/21 0537 07/01/21 0535  WBC 9.1 18.0* 14.8*  NEUTROABS 8.4*  --  13.3*  HGB 8.0* 8.0* 8.9*  HCT 25.3* 25.8* 26.8*  MCV 98.4 96.6 94.0  PLT 137* 130* 155   Cardiac Enzymes: No results for input(s): CKTOTAL, CKMB, CKMBINDEX, TROPONINI in the last 72 hours. BNP: Invalid input(s): POCBNP D-Dimer: No results for input(s): DDIMER in the last 72 hours. Hemoglobin A1C: No results for input(s): HGBA1C in the last 72 hours. Fasting Lipid Panel: Recent Labs    06/29/21 0418  TRIG 129   Thyroid Function Tests: No results for input(s): TSH, T4TOTAL, T3FREE, THYROIDAB in the last 72 hours.  Invalid input(s): FREET3 Anemia Panel: No results for input(s): VITAMINB12, FOLATE, FERRITIN, TIBC, IRON, RETICCTPCT in the last 72 hours.   PHYSICAL EXAM General: Well developed, well nourished, in no acute distress HEENT:  Normocephalic and atramatic Neck:  No JVD.  Lungs: Clear bilaterally to auscultation and percussion. Heart: HRRR . Normal S1 and S2 without gallops or murmurs.  Abdomen: Bowel sounds are positive, abdomen soft and non-tender  Msk:  Back  normal, normal gait. Normal strength and tone for age. Extremities: No clubbing, cyanosis or edema.   Neuro: Alert and oriented X 3. Psych:  Good affect, responds appropriately  TELEMETRY: Sinus tachycardia about 100 bpm  ASSESSMENT AND PLAN: Paroxysmal atrial fibrillation currently in sinus rhythm with history of elevated troponin most likely due to demand ischemia.  Cardiac point of view patient can be discharged with follow-up in the office 1 to 2 weeks.  Active Problems:   ESRD on dialysis [facility])   Essential hypertension   Elevated troponin   Anemia in ESRD (end-stage renal disease) (HCC)   Acute on chronic respiratory failure with hypoxia (HCC)   Acute on chronic combined systolic and diastolic CHF (congestive heart failure) (HCC)   Atrial fibrillation with RVR (HCC)   Thrombocytopenia (HCC)   COPD (chronic obstructive pulmonary disease) (HCC)    Jordan [name] A, MD, [facility] 07/01/2021 9:28 AM

## 2021-07-01 NOTE — Progress Notes (Addendum)
PROGRESS NOTE    Jordan [name]  [id number] DOB: August 22, 1966 DOA: 06/26/2021 PCP: Pcp, No   Chief complaint.  Shortness of breath. Brief Narrative:   55 y.o with significant PMH of ESRD on HD MWF although noncompliant with HD, HTN, CHF, atrial fibrillation and COPD who presented to the ED with chief complaints of acute onset worsening dyspnea, chest discomfort, and abdominal distention after missing multiple dialysis session due to being incarcerated for 1 week. 10/5: Admitted under hospitalist service for acute on chronic respiratory failure, CHF and A. fib with RVR 10/6: Rapid response called for anaphylactic/hypersensitivity reaction.  Patient transferred to the ICU.  PCCM consulted 10/7- patient on vent 70% and levophed, orolingual swelling improved post renal replacement therapy. patient is imrpoved post HD, tongue is 50% less swollen pos dialysis today.  06/29/21- Patient improved, swelling resolved, +cuff leak an dweaned to 30%FiO2 on MV, plan for SBT and liberation from vent today.   Assessment & Plan:   Active Problems:   ESRD on dialysis [facility])   Essential hypertension   Elevated troponin   Anemia in ESRD (end-stage renal disease) (HCC)   Acute on chronic respiratory failure with hypoxia (HCC)   Acute on chronic combined systolic and diastolic CHF (congestive heart failure) (HCC)   Atrial fibrillation with RVR (HCC)   Thrombocytopenia (HCC)   COPD (chronic obstructive pulmonary disease) (HCC)  Acute on chronic respiratory failure with hypoxemia secondary to pulmonary edema. Acute on chronic combined systolic and diastolic congestive heart failure. Condition has been stabilized.  Patient will be dialyzed again by nephrology today, currently on 3 and half liters oxygen.  At home, he was on 2 L oxygen.  Anemia of chronic kidney disease. End-stage renal disease with hyperkalemia Status post 1 unit PRBC transfusion. Hemoglobin has been stable since then. Patient developed  significant hyperkalemia today with potassium 6.5, patient currently in dialysis. Due to have significant hyperkalemia, I will keep patient in hospital for another day, recheck BMP tomorrow.  Paroxysmal atrial fibrillation with RVR. Heart rate better after giving beta-blocker.  Blood pressure still stable.  Acute metabolic encephalopathy. Alcohol abuse. No evidence of alcohol withdrawal.  Hypoglycemia. Condition seem improved today.  Rectal bleeding. Patient had an episode of fresh red blood per rectum witnessed by nephrology.  Spoke with Dr. Haig Prophet, patient had a colonoscopy about 1 month ago, showed internal hemorrhoids.  We will start steroids per rectum.  DVT prophylaxis: Heparin Code Status: Partial Family Communication:  Disposition Plan:      Status is: Inpatient   Remains inpatient appropriate because:Inpatient level of care appropriate due to severity of illness   Dispo: The patient is from: Home              Anticipated d/c is to: Home              Patient currently is not medically stable to d/c.              Difficult to place patient No   Will discharge to home tomorrow if potassium is better.    I/O last 3 completed shifts: In: 37.4 [I.V.:37.4] Out: -  No intake/output data recorded.    Consultants:  Nephrology, cardiology   Procedures: HD   Antimicrobials: None    Subjective: Patient doing well today, request to go home today.  Not able to discharge due to significant hyperkalemia. Short of breath had improved, no cough.  Still on 3.5 L oxygen. No fever or chills. No dysuria or hematuria.  Objective: Vitals:   07/01/21 0800 07/01/21 0900 07/01/21 0904 07/01/21 1000  BP: 127/83 (!) 124/95  135/86  Pulse:  93  92  Resp: 20 (!) 21 (!) 26 19  Temp: 98.2 F (36.8 C)   98 F (36.7 C)  TempSrc:    Axillary  SpO2:  100%    Weight:      Height:       No intake or output data in the 24 hours ending 07/01/21 1004 Filed Weights   06/28/21  0920 06/29/21 0235 07/01/21 0500  Weight: 79 kg 79.5 kg 77.4 kg    Examination:  General exam: Appears calm and comfortable  Respiratory system: Clear to auscultation. Respiratory effort normal. Cardiovascular system: Irregular.  No JVD, murmurs, rubs, gallops or clicks. No pedal edema. Gastrointestinal system: Abdomen is nondistended, soft and nontender. No organomegaly or masses felt. Normal bowel sounds heard. Central nervous system: Alert and oriented. No focal neurological deficits. Extremities: Symmetric  Skin: No rashes, lesions or ulcers Psychiatry: Judgement and insight appear normal. Mood & affect appropriate.     Data Reviewed: I have personally reviewed following labs and imaging studies  CBC: Recent Labs  Lab 06/26/21 0804 06/27/21 0607 06/27/21 2129 06/28/21 0547 06/28/21 1555 06/29/21 0418 06/30/21 0537 07/01/21 0535  WBC 9.6   < > 8.8 5.4  --  9.1 18.0* 14.8*  NEUTROABS 7.6  --   --   --   --  8.4*  --  13.3*  HGB 7.1*   < > 7.3* 6.2* 7.6* 8.0* 8.0* 8.9*  HCT 23.0*   < > 24.1* 20.0* 24.3* 25.3* 25.8* 26.8*  MCV 101.8*   < > 100.4* 99.5  --  98.4 96.6 94.0  PLT 150   < > 150 116*  --  137* 130* 155   < > = values in this interval not displayed.   Basic Metabolic Panel: Recent Labs  Lab 06/26/21 0804 06/26/21 1725 06/27/21 2129 06/28/21 0547 06/28/21 1555 06/29/21 0418 06/30/21 0537 07/01/21 0535  NA 145   < > 140 137 136 135 139 139  K 6.1*   < > 5.1 5.6* 4.5 5.1 5.5* 6.3*  CL 102   < > 97* 98 94* 95* 97* 98  CO2 20*   < > '23 23 26 25 25 '$ 17*  GLUCOSE 95   < > 122* 129* 107* 120* 41* 111*  BUN 107*   < > 88* 92* 55* 62* 89* 99*  CREATININE 18.65*   < > 13.08* 12.90* 8.49* 9.61* 10.53* 11.83*  CALCIUM 9.4   < > 8.6* 8.5* 8.4* 8.7* 8.2* 8.4*  MG 2.5*  --  2.2 2.0  --  1.9 2.1  --   PHOS 9.6*  --   --   --   --  8.2* 8.9*  --    < > = values in this interval not displayed.   GFR: Estimated Creatinine Clearance: 6.8 mL/min (A) (by C-G formula  based on SCr of 11.83 mg/dL (H)). Liver Function Tests: Recent Labs  Lab 06/26/21 0804 06/30/21 0537  AST 20  --   ALT 14  --   ALKPHOS 102  --   BILITOT 1.6*  --   PROT 8.8*  --   ALBUMIN 3.9 3.3*   No results for input(s): LIPASE, AMYLASE in the last 168 hours. No results for input(s): AMMONIA in the last 168 hours. Coagulation Profile: No results for input(s): INR, PROTIME in the last 168 hours. Cardiac Enzymes:  No results for input(s): CKTOTAL, CKMB, CKMBINDEX, TROPONINI in the last 168 hours. BNP (last 3 results) No results for input(s): PROBNP in the last 8760 hours. HbA1C: No results for input(s): HGBA1C in the last 72 hours. CBG: Recent Labs  Lab 06/30/21 0710 06/30/21 0854 06/30/21 1225 06/30/21 2238 07/01/21 0739  GLUCAP 37* 80 129* 91 95   Lipid Profile: Recent Labs    06/29/21 0418  TRIG 129   Thyroid Function Tests: No results for input(s): TSH, T4TOTAL, FREET4, T3FREE, THYROIDAB in the last 72 hours. Anemia Panel: No results for input(s): VITAMINB12, FOLATE, FERRITIN, TIBC, IRON, RETICCTPCT in the last 72 hours. Sepsis Labs: Recent Labs  Lab 06/26/21 0804  LATICACIDVEN 1.6    Recent Results (from the past 240 hour(s))  Resp Panel by RT-PCR (Flu A&B, Covid) Nasopharyngeal Swab     Status: None   Collection Time: 06/26/21  8:04 AM   Specimen: Nasopharyngeal Swab; Nasopharyngeal(NP) swabs in vial transport medium  Result Value Ref Range Status   SARS Coronavirus 2 by RT PCR NEGATIVE NEGATIVE Final    Comment: (NOTE) SARS-CoV-2 target nucleic acids are NOT DETECTED.  The SARS-CoV-2 RNA is generally detectable in upper respiratory specimens during the acute phase of infection. The lowest concentration of SARS-CoV-2 viral copies this assay can detect is 138 copies/mL. A negative result does not preclude SARS-Cov-2 infection and should not be used as the sole basis for treatment or other patient management decisions. A negative result may occur  with  improper specimen collection/handling, submission of specimen other than nasopharyngeal swab, presence of viral mutation(s) within the areas targeted by this assay, and inadequate number of viral copies(<138 copies/mL). A negative result must be combined with clinical observations, patient history, and epidemiological information. The expected result is Negative.  Fact Sheet for Patients:  EntrepreneurPulse.com.au  Fact Sheet for Healthcare Providers:  IncredibleEmployment.be  This test is no t yet approved or cleared by the [place] FDA and  has been authorized for detection and/or diagnosis of SARS-CoV-2 by FDA under an Emergency Use Authorization (EUA). This EUA will remain  in effect (meaning this test can be used) for the duration of the COVID-19 declaration under Section 564(b)(1) of the Act, 21 U.S.C.section 360bbb-3(b)(1), unless the authorization is terminated  or revoked sooner.       Influenza A by PCR NEGATIVE NEGATIVE Final   Influenza B by PCR NEGATIVE NEGATIVE Final    Comment: (NOTE) The Xpert Xpress SARS-CoV-2/FLU/RSV plus assay is intended as an aid in the diagnosis of influenza from Nasopharyngeal swab specimens and should not be used as a sole basis for treatment. Nasal washings and aspirates are unacceptable for Xpert Xpress SARS-CoV-2/FLU/RSV testing.  Fact Sheet for Patients: EntrepreneurPulse.com.au  Fact Sheet for Healthcare Providers: IncredibleEmployment.be  This test is not yet approved or cleared by the [place] FDA and has been authorized for detection and/or diagnosis of SARS-CoV-2 by FDA under an Emergency Use Authorization (EUA). This EUA will remain in effect (meaning this test can be used) for the duration of the COVID-19 declaration under Section 564(b)(1) of the Act, 21 U.S.C. section 360bbb-3(b)(1), unless the authorization is terminated  or revoked.  Performed at [facility], [address] [place], [place] [address]   Culture, Respiratory w Gram Stain     Status: None   Collection Time: 06/28/21  4:00 PM   Specimen: Tracheal Aspirate; Respiratory  Result Value Ref Range Status   Specimen Description   Final    TRACHEAL  ASPIRATE Performed at [facility], [address], [place], [place] [address]    Special Requests   Final    NONE Performed at [facility], [place], [place] [address]    Gram Stain   Final    NO SQUAMOUS EPITHELIAL CELLS SEEN ABUNDANT WBC PRESENT,BOTH PMN AND MONONUCLEAR ABUNDANT GRAM POSITIVE COCCI IN CHAINS IN CLUSTERS MODERATE GRAM NEGATIVE RODS RARE GRAM POSITIVE RODS    Culture   Final    MODERATE Normal respiratory flora-no Staph aureus or Pseudomonas seen Performed at [facility], [place] [address], [place], [place] [address]    Report Status 06/30/2021 FINAL  Final         Radiology Studies: No results found.      Scheduled Meds:  sodium chloride   Intravenous Once   calcium acetate  2,001 mg Oral TID WC   Chlorhexidine Gluconate Cloth  6 each Topical Q0600   cinacalcet  30 mg Oral Q breakfast   dexamethasone (DECADRON) injection  4 mg Intravenous Q12H   epoetin (EPOGEN/PROCRIT) injection  10,000 Units Intravenous Q M,W,F-HD   ferrous sulfate  325 mg Oral BID WC   heparin  5,000 Units Subcutaneous Q8H   ipratropium-albuterol  3 mL Nebulization Q6H   mouth rinse  15 mL Mouth Rinse BID   metoprolol tartrate  50 mg Oral BID   Continuous Infusions:  sodium chloride     famotidine (PEPCID) IV 20 mg (06/30/21 2127)     LOS: 5 days    Time spent: 27 minutes    Sharen Hones, MD [facility]   To contact the attending provider between 7A-7P or the covering provider during after hours 7P-7A, please log into the web site [email or web address] and access using universal [place] password for that web site. If you  do not have the password, please call the hospital operator.  07/01/2021, 10:04 AM

## 2021-07-01 NOTE — Progress Notes (Signed)
[facility]  ROUNDING NOTE   Subjective:   [name] was admitted to [facility] on 06/26/2021 for End stage renal disease ([place]) [N18.6] Atrial fibrillation with RVR ([place]) [I48.91]  With acute respiratory failure requiring intubated  Now extubated K high this AM Was able to eat a good breakfast   Objective:  Vital signs in last 24 hours:  Temp:  [98.1 F (36.7 C)-98.6 F (37 C)] 98.4 F (36.9 C) (10/10 0000) Pulse Rate:  [50-85] 85 (10/10 0700) Resp:  [12-29] 20 (10/10 0800) BP: (103-140)/(69-93) 127/83 (10/10 0800) SpO2:  [93 %-100 %] 100 % (10/10 0700) Weight:  [77.4 kg] 77.4 kg (10/10 0500)  Weight change:  Filed Weights   06/28/21 0920 06/29/21 0235 07/01/21 0500  Weight: 79 kg 79.5 kg 77.4 kg    Intake/Output: I/O last 3 completed shifts: In: 37.4 [I.V.:37.4] Out: -    Intake/Output this shift:  No intake/output data recorded.  Physical Exam: General: Chronically ill appearing  Head: Moist oral mucus membranes  Eyes: Anicteric,   Lungs:  Diminished bilaterally, 4L [place] O2  Heart: +tachycardia, irregular  Abdomen:  Soft  Extremities:  no peripheral edema.  Neurologic: Alert and oriented  Skin: Multiple abrasions  Access: Left aneurysmal AVF    Basic Metabolic Panel: Recent Labs  Lab 06/26/21 0804 06/26/21 1725 06/27/21 2129 06/28/21 0547 06/28/21 1555 06/29/21 0418 06/30/21 0537 07/01/21 0535  NA 145   < > 140 137 136 135 139 139  K 6.1*   < > 5.1 5.6* 4.5 5.1 5.5* 6.3*  CL 102   < > 97* 98 94* 95* 97* 98  CO2 20*   < > '23 23 26 25 25 '$ 17*  GLUCOSE 95   < > 122* 129* 107* 120* 41* 111*  BUN 107*   < > 88* 92* 55* 62* 89* 99*  CREATININE 18.65*   < > 13.08* 12.90* 8.49* 9.61* 10.53* 11.83*  CALCIUM 9.4   < > 8.6* 8.5* 8.4* 8.7* 8.2* 8.4*  MG 2.5*  --  2.2 2.0  --  1.9 2.1  --   PHOS 9.6*  --   --   --   --  8.2* 8.9*  --    < > = values in this interval not displayed.     Liver Function Tests: Recent Labs  Lab  06/26/21 0804 06/30/21 0537  AST 20  --   ALT 14  --   ALKPHOS 102  --   BILITOT 1.6*  --   PROT 8.8*  --   ALBUMIN 3.9 3.3*    No results for input(s): LIPASE, AMYLASE in the last 168 hours. No results for input(s): AMMONIA in the last 168 hours.  CBC: Recent Labs  Lab 06/26/21 0804 06/27/21 0607 06/27/21 2129 06/28/21 0547 06/28/21 1555 06/29/21 0418 06/30/21 0537 07/01/21 0535  WBC 9.6   < > 8.8 5.4  --  9.1 18.0* 14.8*  NEUTROABS 7.6  --   --   --   --  8.4*  --  13.3*  HGB 7.1*   < > 7.3* 6.2* 7.6* 8.0* 8.0* 8.9*  HCT 23.0*   < > 24.1* 20.0* 24.3* 25.3* 25.8* 26.8*  MCV 101.8*   < > 100.4* 99.5  --  98.4 96.6 94.0  PLT 150   < > 150 116*  --  137* 130* 155   < > = values in this interval not displayed.     Cardiac Enzymes: No results for  input(s): CKTOTAL, CKMB, CKMBINDEX, TROPONINI in the last 168 hours.  BNP: Invalid input(s): POCBNP  CBG: Recent Labs  Lab 06/30/21 0710 06/30/21 0854 06/30/21 1225 06/30/21 2238 07/01/21 0739  GLUCAP 37* 80 129* 91 95     Microbiology: Results for orders placed or performed during the hospital encounter of 06/26/21  Resp Panel by RT-PCR (Flu A&B, Covid) Nasopharyngeal Swab     Status: None   Collection Time: 06/26/21  8:04 AM   Specimen: Nasopharyngeal Swab; Nasopharyngeal(NP) swabs in vial transport medium  Result Value Ref Range Status   SARS Coronavirus 2 by RT PCR NEGATIVE NEGATIVE Final    Comment: (NOTE) SARS-CoV-2 target nucleic acids are NOT DETECTED.  The SARS-CoV-2 RNA is generally detectable in upper respiratory specimens during the acute phase of infection. The lowest concentration of SARS-CoV-2 viral copies this assay can detect is 138 copies/mL. A negative result does not preclude SARS-Cov-2 infection and should not be used as the sole basis for treatment or other patient management decisions. A negative result may occur with  improper specimen collection/handling, submission of specimen  other than nasopharyngeal swab, presence of viral mutation(s) within the areas targeted by this assay, and inadequate number of viral copies(<138 copies/mL). A negative result must be combined with clinical observations, patient history, and epidemiological information. The expected result is Negative.  Fact Sheet for Patients:  EntrepreneurPulse.com.au  Fact Sheet for Healthcare Providers:  IncredibleEmployment.be  This test is no t yet approved or cleared by the [place] FDA and  has been authorized for detection and/or diagnosis of SARS-CoV-2 by FDA under an Emergency Use Authorization (EUA). This EUA will remain  in effect (meaning this test can be used) for the duration of the COVID-19 declaration under Section 564(b)(1) of the Act, 21 U.S.C.section 360bbb-3(b)(1), unless the authorization is terminated  or revoked sooner.       Influenza A by PCR NEGATIVE NEGATIVE Final   Influenza B by PCR NEGATIVE NEGATIVE Final    Comment: (NOTE) The Xpert Xpress SARS-CoV-2/FLU/RSV plus assay is intended as an aid in the diagnosis of influenza from Nasopharyngeal swab specimens and should not be used as a sole basis for treatment. Nasal washings and aspirates are unacceptable for Xpert Xpress SARS-CoV-2/FLU/RSV testing.  Fact Sheet for Patients: EntrepreneurPulse.com.au  Fact Sheet for Healthcare Providers: IncredibleEmployment.be  This test is not yet approved or cleared by the [place] FDA and has been authorized for detection and/or diagnosis of SARS-CoV-2 by FDA under an Emergency Use Authorization (EUA). This EUA will remain in effect (meaning this test can be used) for the duration of the COVID-19 declaration under Section 564(b)(1) of the Act, 21 U.S.C. section 360bbb-3(b)(1), unless the authorization is terminated or revoked.  Performed at [facility], [address]  [place], [place] [address]   Culture, Respiratory w Gram Stain     Status: None   Collection Time: 06/28/21  4:00 PM   Specimen: Tracheal Aspirate; Respiratory  Result Value Ref Range Status   Specimen Description   Final    TRACHEAL ASPIRATE Performed at [facility], [address] [place], [place] [address]    Special Requests   Final    NONE Performed at [place], [place] [address]    Gram Stain   Final    NO SQUAMOUS EPITHELIAL CELLS SEEN ABUNDANT WBC PRESENT,BOTH PMN AND MONONUCLEAR ABUNDANT GRAM POSITIVE COCCI IN CHAINS IN CLUSTERS MODERATE GRAM NEGATIVE RODS RARE [place]  Culture   Final    MODERATE Normal respiratory flora-no Staph aureus or Pseudomonas seen Performed at [facility] [address], [place], [place] [address]    Report Status 06/30/2021 FINAL  Final    Coagulation Studies: No results for input(s): LABPROT, INR in the last 72 hours.  Urinalysis: No results for input(s): COLORURINE, LABSPEC, PHURINE, GLUCOSEU, HGBUR, BILIRUBINUR, KETONESUR, PROTEINUR, UROBILINOGEN, NITRITE, LEUKOCYTESUR in the last 72 hours.  Invalid input(s): APPERANCEUR    Imaging: No results found.   Medications:    sodium chloride     famotidine (PEPCID) IV 20 mg (06/30/21 2127)    sodium chloride   Intravenous Once   calcium acetate  2,001 mg Oral TID WC   Chlorhexidine Gluconate Cloth  6 each Topical Q0600   cinacalcet  30 mg Oral Q breakfast   dexamethasone (DECADRON) injection  4 mg Intravenous Q12H   epoetin (EPOGEN/PROCRIT) injection  10,000 Units Intravenous Q M,W,F-HD   ferrous sulfate  325 mg Oral BID WC   heparin  5,000 Units Subcutaneous Q8H   ipratropium-albuterol  3 mL Nebulization Q6H   mouth rinse  15 mL Mouth Rinse BID   metoprolol tartrate  50 mg Oral BID     Assessment/ Plan:  Mr. NICHOLES HUML is a 55 y.o. black male with end stage renal disease on hemodialysis, hypertension,  COPD, EtOH abuse, atrial fibrillation, congestive heart failure who is admitted to [facility] on End stage renal disease ([place]) [N18.6] Atrial fibrillation with RVR ([place]) [I48.91]   [facility] MWF [facility]. Left AVF 67kg  End Stage Renal Disease: hyperkalemia, pulmonary edema and volume overload on admission.  - Monitor daily for dialysis need. continue MWF schedule.  - HD today with 1 K bath for part of the treatment  Hypotension: with hypertensive urgency with atrial fibrillation with rapid ventricular response on admission.  - weaned off norepinephrine - holding home blood pressure agents.   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 - restart phos binders: calcium acetate - continue cinacalcet.   Acute respiratory failure: requiring intubation and mechanical ventilation. Secondary to pulmonary edema. Improved with dialysis.   Anemia with chronic kidney disease:  status post PRBC transfusion on 10/7.  getting outpatient Mircera as ESA.  - ESA with HD treatment     LOS: 5 Jordan [name] 10/10/20228:29 AM

## 2021-07-01 NOTE — Consult Note (Signed)
Consultation  Referring Provider:    Dr. Roosevelt Locks Admit date: 10/5 Consult date      10/10   Reason for Consultation:     Hematochezia         HPI:   Jordan [name] is a 55 y.o. gentleman with history of ESRD, hypertension, COPD, and other medical comorbidities who was intubated for respiratory failure due to missing dialysis sessions and has been extubated. Has an episode of hematochezia with dialysis but none since. Patient is difficult historian so information mainly gathered from chart. Patient had EGD/Colonoscopy only a couple of weeks ago.  Past Medical History:  Diagnosis Date  . Anemia   . Aortic atherosclerosis ([place]) 11/12/2019  . ED (erectile dysfunction)   . Emphysema of lung ([place]) 11/12/2019  . ESRD on hemodialysis ([place])    M/W/F in [place], [place]  . ETOH abuse   . History of blood transfusion   . Hypertension   . Wears dentures     Past Surgical History:  Procedure Laterality Date  . BASCILIC VEIN TRANSPOSITION Left 08/07/2016   Procedure: LEFT BASILIC VEIN TRANSPOSITION;  Surgeon: Angelia Mould, MD;  Location: [facility];  Service: Vascular;  Laterality: Left;  . CLOSED REDUCTION NASAL FRACTURE N/A 08/11/2019   Procedure: CLOSED REDUCTION NASAL FRACTURE WITH STABILIZATION;  Surgeon: Irene Limbo, MD;  Location: [facility];  Service: Plastics;  Laterality: N/A;  . COLONOSCOPY N/A 08/12/2016   Procedure: COLONOSCOPY;  Surgeon: Otis Brace, MD;  Location: [facility];  Service: Gastroenterology;  Laterality: N/A;  . COLONOSCOPY WITH PROPOFOL N/A 06/05/2021   Procedure: COLONOSCOPY WITH PROPOFOL;  Surgeon: Sharyn Creamer, MD;  Location: [facility];  Service: Gastroenterology;  Laterality: N/A;  . ESOPHAGOGASTRODUODENOSCOPY N/A 08/12/2016   Procedure: ESOPHAGOGASTRODUODENOSCOPY (EGD);  Surgeon: Otis Brace, MD;  Location: [facility];  Service: Gastroenterology;  Laterality: N/A;  . ESOPHAGOGASTRODUODENOSCOPY (EGD) WITH PROPOFOL N/A 06/05/2021   Procedure:  ESOPHAGOGASTRODUODENOSCOPY (EGD) WITH PROPOFOL;  Surgeon: Sharyn Creamer, MD;  Location: [facility];  Service: Gastroenterology;  Laterality: N/A;  . INSERTION OF DIALYSIS CATHETER N/A 08/07/2016   Procedure: INSERTION OF TUNNELED DIALYSIS CATHETER;  Surgeon: Angelia Mould, MD;  Location: [facility];  Service: Vascular;  Laterality: N/A;  . LIGATION OF ARTERIOVENOUS  FISTULA Left 09/12/2016   Procedure: BANDING OF LEFT  ARTERIOVENOUS  FISTULA;  Surgeon: Angelia Mould, MD;  Location: [facility];  Service: Vascular;  Laterality: Left;  . LOWER EXTREMITY INTERVENTION Right 12/02/2018   Procedure: LOWER EXTREMITY INTERVENTION;  Surgeon: Algernon Huxley, MD;  Location: [facility];  Service: Cardiovascular;  Laterality: Right;  . POLYPECTOMY  06/05/2021   Procedure: POLYPECTOMY;  Surgeon: Sharyn Creamer, MD;  Location: [facility];  Service: Gastroenterology;;    Family History  Problem Relation Age of Onset  . Diabetes Mother   . Kidney failure Mother   . Healthy Father   . Kidney failure Brother   . Healthy Sister   . Kidney disease Daughter   . Post-traumatic stress disorder Neg Hx   . Bladder Cancer Neg Hx   . Kidney cancer Neg Hx     Social History   Tobacco Use  . Smoking status: Every Day    Packs/day: 0.50    Years: 40.00    Pack years: 20.00    Types: Cigarettes  . Smokeless tobacco: Never  Vaping Use  . Vaping Use: Never used  Substance Use Topics  . Alcohol use: Not Currently    Alcohol/week: 21.0  standard drinks    Types: 21 Cans of beer per week    Comment: last drink 6 months ago  . Drug use: No    Prior to Admission medications   Medication Sig Start Date End Date Taking? Authorizing Provider  albuterol (VENTOLIN HFA) 108 (90 Base) MCG/ACT inhaler Inhale 2 puffs into the lungs every 4 (four) hours as needed for wheezing or shortness of breath. 06/05/21  Yes Danford, Suann Larry, MD  calcium acetate (PHOSLO) 667 MG capsule Take 2 capsules (1,334 mg  total) by mouth 3 (three) times daily with meals. 06/05/21  Yes Danford, Suann Larry, MD  cinacalcet (SENSIPAR) 30 MG tablet Take 1 tablet (30 mg total) by mouth daily with breakfast. 06/05/21  Yes Danford, Suann Larry, MD  docusate sodium (COLACE) 100 MG capsule Take 1 capsule (100 mg total) by mouth 2 (two) times daily. 06/05/21  Yes Danford, Suann Larry, MD  epoetin alfa (EPOGEN) 4000 UNIT/ML injection Inject 1 mL (4,000 Units total) into the vein every Monday, Wednesday, and Friday with hemodialysis. 06/22/20  Yes Enzo Bi, MD  ferrous sulfate 325 (65 FE) MG tablet Take 1 tablet (325 mg total) by mouth 2 (two) times daily with a meal. 06/05/21  Yes Danford, Suann Larry, MD  hydrALAZINE (APRESOLINE) 50 MG tablet Take 1 tablet (50 mg total) by mouth 3 (three) times daily. 06/05/21  Yes Danford, Suann Larry, MD  pantoprazole (PROTONIX) 20 MG tablet Take 1 tablet (20 mg total) by mouth 2 (two) times daily before a meal. 06/05/21 08/04/21 Yes Danford, Suann Larry, MD    Current Facility-Administered Medications  Medication Dose Route Frequency Provider Last Rate Last Admin  . 0.9 %  sodium chloride infusion (Manually program via Guardrails IV Fluids)   Intravenous Once Lang Snow, NP      . 0.9 %  sodium chloride infusion  250 mL Intravenous Continuous Ottie Glazier, MD      . acetaminophen (TYLENOL) tablet 650 mg  650 mg Oral Q6H PRN Mansy, Jan A, MD       Or  . acetaminophen (TYLENOL) suppository 650 mg  650 mg Rectal Q6H PRN Mansy, Jan A, MD      . albuterol (PROVENTIL) (2.5 MG/3ML) 0.083% nebulizer solution 2.5 mg  2.5 mg Nebulization Q4H PRN Ottie Glazier, MD      . calcium acetate (PHOSLO) capsule 2,001 mg  2,001 mg Oral TID WC Kolluru, Sarath, MD   2,001 mg at 07/01/21 0921  . Chlorhexidine Gluconate Cloth 2 % PADS 6 each  6 each Topical Q0600 Lavonia Dana, MD   6 each at 07/01/21 [phone]  . cinacalcet (SENSIPAR) tablet 30 mg  30 mg Oral Q breakfast Kolluru, Sarath, MD    30 mg at 07/01/21 [id number]  . dexamethasone (DECADRON) injection 4 mg  4 mg Intravenous Q12H Ottie Glazier, MD   4 mg at 07/01/21 [id number]  . diphenhydrAMINE (BENADRYL) injection 25 mg  25 mg Intravenous BID PRN Rust-Chester, Britton L, NP   25 mg at 07/01/21 0148  . epoetin alfa (EPOGEN) injection 10,000 Units  10,000 Units Intravenous Q M,W,F-HD Kolluru, Sarath, MD   10,000 Units at 06/28/21 1106  . famotidine (PEPCID) IVPB 20 mg premix  20 mg Intravenous Q12H Ottie Glazier, MD 100 mL/hr at 06/30/21 2127 20 mg at 06/30/21 2127  . ferrous sulfate tablet 325 mg  325 mg Oral BID WC Mansy, Jan A, MD   325 mg at 07/01/21 0921  . heparin injection 5,000 Units  5,000 Units Subcutaneous Q8H Sharen Hones, MD   5,000 Units at 07/01/21 [phone]  . hydrocortisone (ANUSOL-HC) 2.5 % rectal cream   Rectal TID Sharen Hones, MD      . ipratropium-albuterol (DUONEB) 0.5-2.5 (3) MG/3ML nebulizer solution 3 mL  3 mL Nebulization Q6H Ottie Glazier, MD   3 mL at 06/30/21 1940  . lip balm (BLISTEX) ointment   Topical PRN Rust-Chester, Britton L, NP      . MEDLINE mouth rinse  15 mL Mouth Rinse BID Ottie Glazier, MD   15 mL at 07/01/21 0922  . metoprolol tartrate (LOPRESSOR) tablet 50 mg  50 mg Oral BID Sharen Hones, MD   50 mg at 07/01/21 [id number]    Allergies as of 06/26/2021 - Review Complete 06/26/2021  Allergen Reaction Noted  . Lisinopril Swelling 01/15/2021     Review of Systems:    All systems reviewed and negative except where noted in HPI.  Unable to adequately assess due to mental status    Physical Exam:  Vital signs in last 24 hours: Temp:  [98 F (36.7 C)-98.4 F (36.9 C)] 98.1 F (36.7 C) (10/10 1315) Pulse Rate:  [50-98] 82 (10/10 1600) Resp:  [11-26] 18 (10/10 1600) BP: (103-141)/(69-126) 130/89 (10/10 1600) SpO2:  [92 %-100 %] 92 % (10/10 1600) Weight:  [77.4 kg-79.5 kg] 79.5 kg (10/10 1000) Last BM Date: 07/01/21 General:   In no acute distress Head:  Normocephalic and  atraumatic. Eyes:   No icterus.   Conjunctiva pink. Mouth: Mucosa pink moist, no lesions. Neck:  Supple; no masses felt Lungs:  Coarse breath sounds Heart:  RRR Abdomen:   non-tender, non-distended Msk:   No clubbing or cyanosis. Strength 5/5. Symmetrical without gross deformities. Neurologic:  No focal deficits Skin:  Warm, dry, pink without significant lesions or rashes. Psych:  Alert. Flat affect.  LAB RESULTS: Recent Labs    06/29/21 0418 06/30/21 0537 07/01/21 0535  WBC 9.1 18.0* 14.8*  HGB 8.0* 8.0* 8.9*  HCT 25.3* 25.8* 26.8*  PLT 137* 130* 155   BMET Recent Labs    06/29/21 0418 06/30/21 0537 07/01/21 0535 07/01/21 1120  NA 135 139 139  --   K 5.1 5.5* 6.3* 4.1  CL 95* 97* 98  --   CO2 25 25 17*  --   GLUCOSE 120* 41* 111*  --   BUN 62* 89* 99*  --   CREATININE 9.61* 10.53* 11.83*  --   CALCIUM 8.7* 8.2* 8.4*  --    LFT Recent Labs    06/30/21 0537  ALBUMIN 3.3*   PT/INR No results for input(s): LABPROT, INR in the last 72 hours.  STUDIES: No results found.     Impression / Plan:   55 y/o gentleman with many medical comorbidities who was recently extubated who had episode of hematochezia during dialysis but unremarkable colonoscopy three weeks ago. Recommend monitoring for any further bleeding  - can start steroid suppositories for hemorrhoids - monitor for any overt GI bleeding  We will follow peripherally. Please call with any questions or concerns.  Raylene Miyamoto MD, MPH [facility]

## 2021-07-01 NOTE — Progress Notes (Signed)
Patient resting in bed at this time with eyes closed. Initially he slept in the recliner at the bedside by choice. He has been up and down to bedside commode multiple times during this shift. Each time, he disconnects himself from the monitors. He states that there is no way that he can stay connected on the bedside commode. Was given PRN antihistamine for itching, which is just becoming effective at this time. Potassium is 6.2 and hospitalist notified. He's has been bathed twice. Call bell in reach. Bed alarm is on. Will continue to monitor.

## 2021-07-01 NOTE — Progress Notes (Signed)
Returns from Dialysis

## 2021-07-02 ENCOUNTER — Inpatient Hospital Stay: Payer: Medicare Other

## 2021-07-02 DIAGNOSIS — J9621 Acute and chronic respiratory failure with hypoxia: Secondary | ICD-10-CM | POA: Diagnosis not present

## 2021-07-02 DIAGNOSIS — I5043 Acute on chronic combined systolic (congestive) and diastolic (congestive) heart failure: Secondary | ICD-10-CM | POA: Diagnosis not present

## 2021-07-02 DIAGNOSIS — N186 End stage renal disease: Secondary | ICD-10-CM | POA: Diagnosis not present

## 2021-07-02 DIAGNOSIS — D631 Anemia in chronic kidney disease: Secondary | ICD-10-CM | POA: Diagnosis not present

## 2021-07-02 LAB — BPAM RBC
Blood Product Expiration Date: 202210112359
Blood Product Expiration Date: 202210242359
Blood Product Expiration Date: 202210252359
ISSUE DATE / TIME: 202210071111
Unit Type and Rh: 9500
Unit Type and Rh: 9500
Unit Type and Rh: 9500

## 2021-07-02 LAB — TYPE AND SCREEN
ABO/RH(D): O NEG
Antibody Screen: POSITIVE
Donor AG Type: NEGATIVE
Unit division: 0
Unit division: 0
Unit division: 0

## 2021-07-02 LAB — GLUCOSE, CAPILLARY
Glucose-Capillary: 100 mg/dL — ABNORMAL HIGH (ref 70–99)
Glucose-Capillary: 72 mg/dL (ref 70–99)
Glucose-Capillary: 110 mg/dL — ABNORMAL HIGH (ref 70–99)

## 2021-07-02 LAB — BASIC METABOLIC PANEL
Anion gap: 23 — ABNORMAL HIGH (ref 5–15)
BUN: 90 mg/dL — ABNORMAL HIGH (ref 6–20)
CO2: 19 mmol/L — ABNORMAL LOW (ref 22–32)
Calcium: 8.3 mg/dL — ABNORMAL LOW (ref 8.9–10.3)
Chloride: 95 mmol/L — ABNORMAL LOW (ref 98–111)
Creatinine, Ser: 9.68 mg/dL — ABNORMAL HIGH (ref 0.61–1.24)
GFR, Estimated: 6 mL/min — ABNORMAL LOW (ref >60)
Glucose, Bld: 81 mg/dL (ref 70–99)
Potassium: 5.4 mmol/L — ABNORMAL HIGH (ref 3.5–5.1)
Sodium: 137 mmol/L (ref 135–145)

## 2021-07-02 LAB — BLOOD GAS, ARTERIAL
Acid-Base Excess: 0.6 mmol/L (ref 0.0–2.0)
Bicarbonate: 26.5 mmol/L (ref 20.0–28.0)
O2 Saturation: 33.8 %
Patient temperature: 37
pCO2 arterial: 48 mmHg (ref 32.0–48.0)
pH, Arterial: 7.35 (ref 7.350–7.450)
pO2, Arterial: <31 mmHg — CL (ref 83.0–108.0)

## 2021-07-02 IMAGING — CR DG CHEST 2V
2 series · 2 of 2 positions shown · non-contrast
Comparison: [DATE]

CLINICAL DATA: Respiratory failure.

EXAM:
CHEST - 2 VIEW

[chest lat]
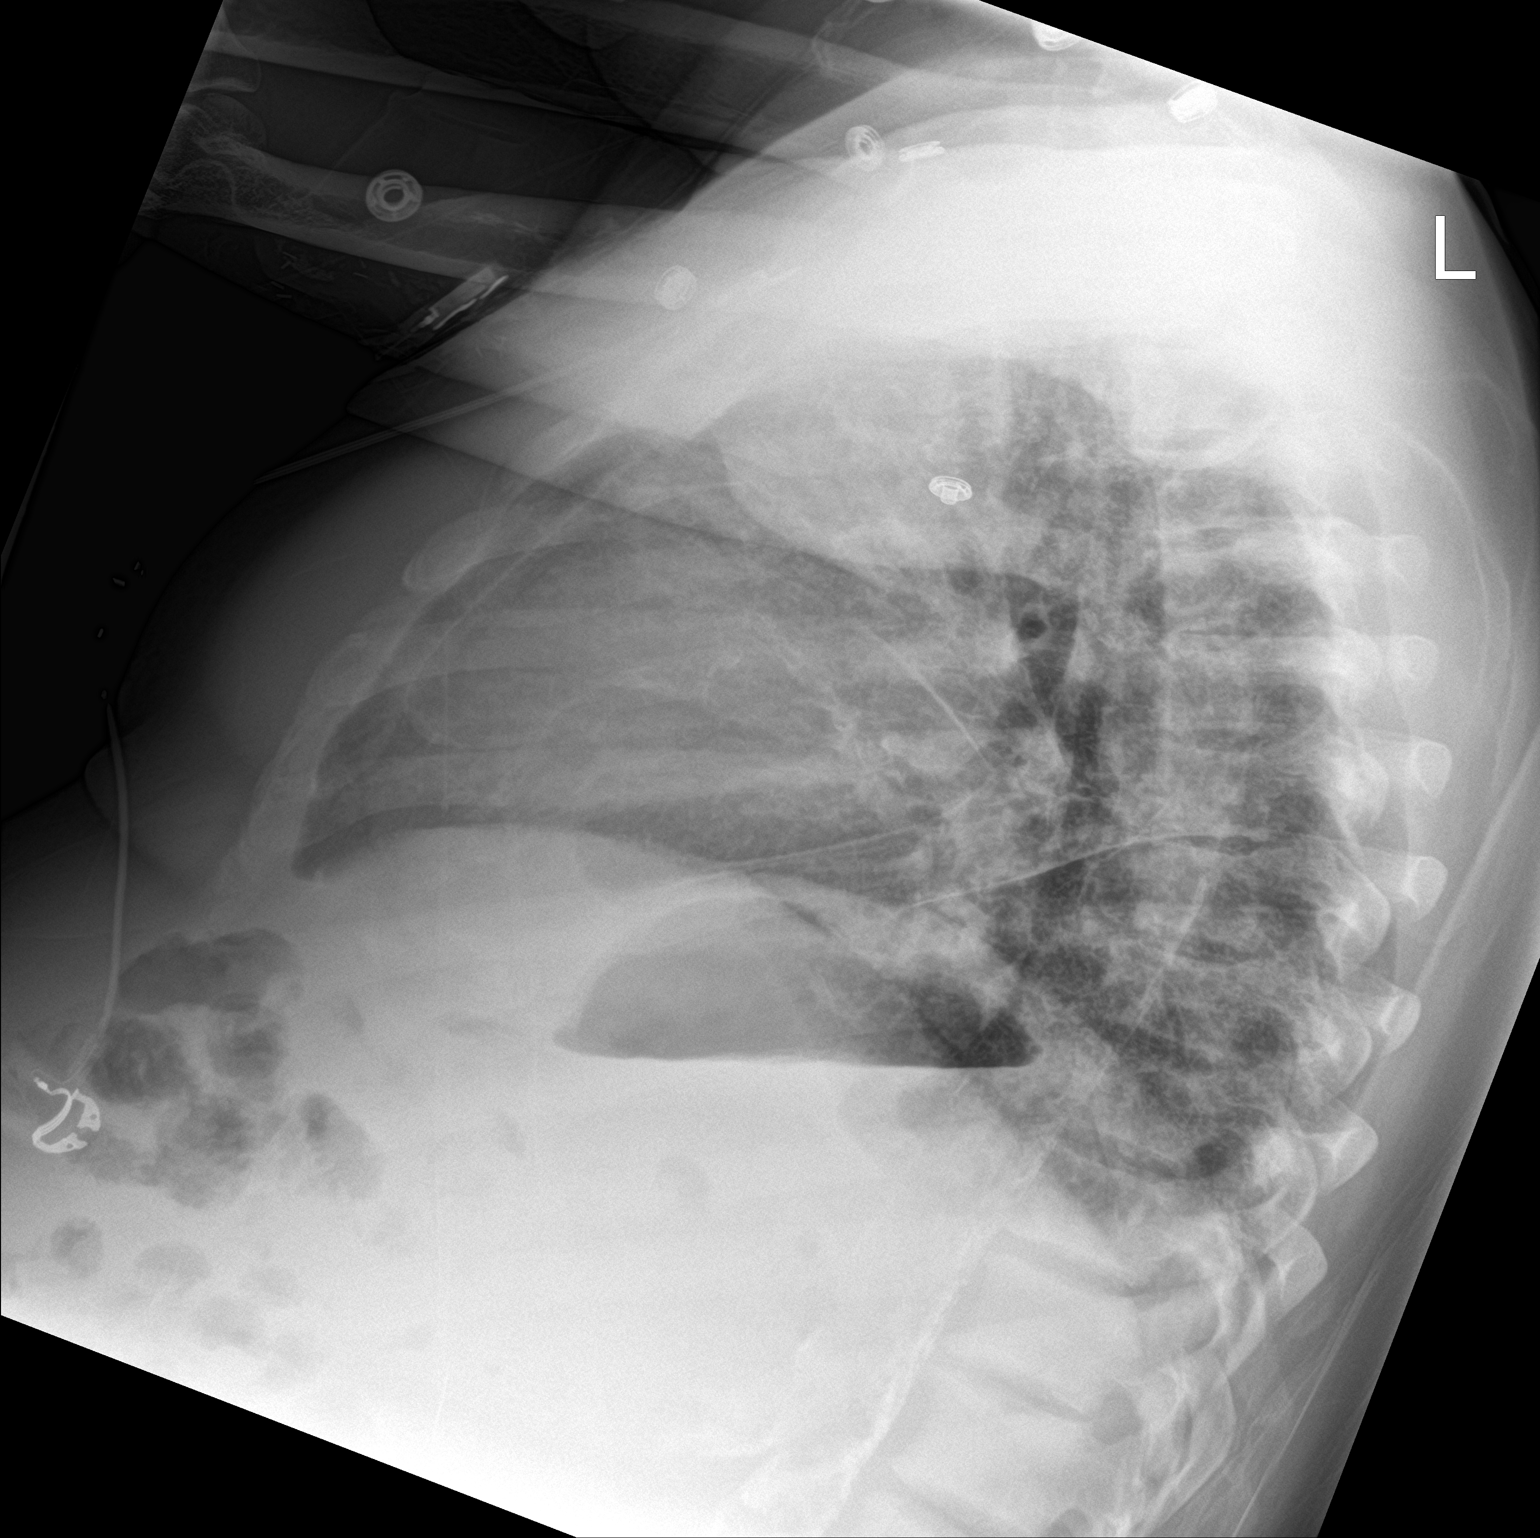

[chest ap]
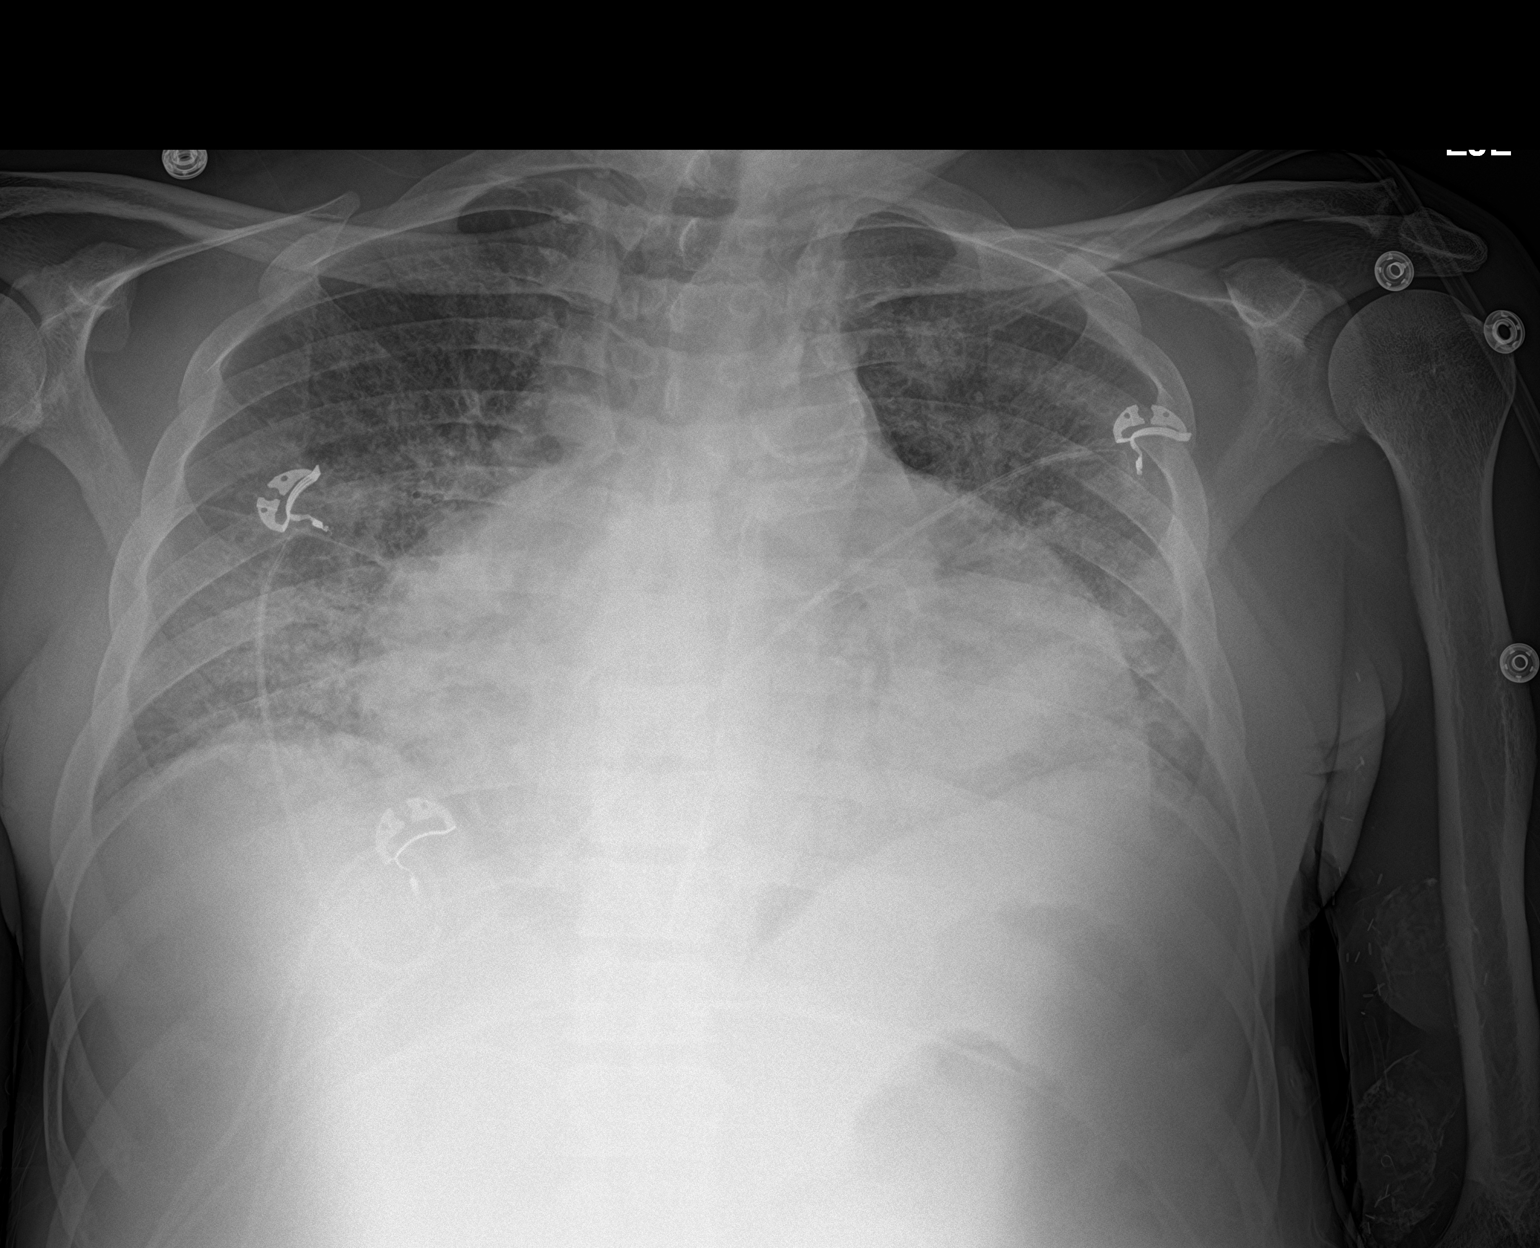

[2 of 2 positions shown; findings below may reference images not displayed]

FINDINGS: [H4] hours. Interval extubation. Low volume film. The cardio
pericardial silhouette is enlarged. Diffuse interstitial and
parahilar alveolar opacity suggests edema. No substantial pleural
effusion. Telemetry leads overlie the chest.
IMPRESSION: Low volume film with cardiomegaly and pulmonary edema.

## 2021-07-02 MED ORDER — PATIROMER SORBITEX CALCIUM 8.4 G PO PACK
16.8000 g | PACK | Freq: Once | ORAL | Status: AC
  Administered 2021-07-02: 16.8 g via ORAL
  Filled 2021-07-02: qty 2

## 2021-07-02 MED ORDER — IPRATROPIUM-ALBUTEROL 0.5-2.5 (3) MG/3ML IN SOLN
3.0000 mL | Freq: Two times a day (BID) | RESPIRATORY_TRACT | Status: DC
  Administered 2021-07-02 – 2021-07-03 (×3): 3 mL via RESPIRATORY_TRACT
  Filled 2021-07-02 (×4): qty 3

## 2021-07-02 MED ORDER — FAMOTIDINE 20 MG PO TABS
20.0000 mg | ORAL_TABLET | Freq: Two times a day (BID) | ORAL | Status: DC
  Administered 2021-07-03 – 2021-07-05 (×4): 20 mg via ORAL
  Filled 2021-07-02 (×5): qty 1

## 2021-07-02 NOTE — Care Management Important Message (Signed)
Important Message  Patient Details  Name: TEDD BACHMANN MRN: [id number] Date of Birth: [birth year]   Medicare Important Message Given:  Yes     Dannette Barbara 07/02/2021, 3:56 PM

## 2021-07-02 NOTE — Progress Notes (Signed)
[facility]  ROUNDING NOTE   Subjective:   Jordan [name] was admitted to [facility] on 06/26/2021 for End stage renal disease ([place]) [N18.6] Atrial fibrillation with RVR ([place]) [I48.91]  With acute respiratory failure requiring intubated   Patient laying in bed Complains of weakness and fatigue Potassium elevated at 5.4 Received call from primary for patient having increased oxygen requirements and shortness of breath, requesting dialysis   Objective:  Vital signs in last 24 hours:  Temp:  [97.6 F (36.4 C)-98.6 F (37 C)] 98.4 F (36.9 C) (10/11 1314) Pulse Rate:  [36-82] 68 (10/11 1140) Resp:  [17-23] 22 (10/11 1314) BP: (101-133)/(65-105) 120/80 (10/11 1140) SpO2:  [86 %-100 %] 100 % (10/11 1314) FiO2 (%):  [28 %] 28 % (10/11 0256) Weight:  [77.1 kg] 77.1 kg (10/11 0210)  Weight change: 2.1 kg Filed Weights   07/01/21 0500 07/01/21 1000 07/02/21 0210  Weight: 77.4 kg 79.5 kg 77.1 kg    Intake/Output: I/O last 3 completed shifts: In: 158 [I.V.:8; IV Piggyback:150] Out: [id number] [Other:432]   Intake/Output this shift:  Total I/O In: 240 [P.O.:240] Out: -   Physical Exam: General: ill appearing  Head: Moist oral mucus membranes  Eyes: Anicteric  Lungs:  Diminished bilaterally, 4L [place] O2  Heart: Regular rate and rhythm   Abdomen:  Soft, nontender  Extremities:  no peripheral edema.  Neurologic: Alert and oriented  Skin: Multiple abrasions  Access: Left aneurysmal AVF    Basic Metabolic Panel: Recent Labs  Lab 06/26/21 0804 06/26/21 1725 06/27/21 2129 06/28/21 0547 06/28/21 1555 06/29/21 0418 06/30/21 0537 07/01/21 0535 07/01/21 1120 07/02/21 0551  NA 145   < > 140 137 136 135 139 139  --  137  K 6.1*   < > 5.1 5.6* 4.5 5.1 5.5* 6.3* 4.1 5.4*  CL 102   < > 97* 98 94* 95* 97* 98  --  95*  CO2 20*   < > '23 23 26 25 25 '$ 17*  --  19*  GLUCOSE 95   < > 122* 129* 107* 120* 41* 111*  --  81  BUN 107*   < > 88* 92* 55* 62* 89* 99*  --  90*   CREATININE 18.65*   < > 13.08* 12.90* 8.49* 9.61* 10.53* 11.83*  --  9.68*  CALCIUM 9.4   < > 8.6* 8.5* 8.4* 8.7* 8.2* 8.4*  --  8.3*  MG 2.5*  --  2.2 2.0  --  1.9 2.1  --   --   --   PHOS 9.6*  --   --   --   --  8.2* 8.9*  --   --   --    < > = values in this interval not displayed.     Liver Function Tests: Recent Labs  Lab 06/26/21 0804 06/30/21 0537  AST 20  --   ALT 14  --   ALKPHOS 102  --   BILITOT 1.6*  --   PROT 8.8*  --   ALBUMIN 3.9 3.3*    No results for input(s): LIPASE, AMYLASE in the last 168 hours. No results for input(s): AMMONIA in the last 168 hours.  CBC: Recent Labs  Lab 06/26/21 0804 06/27/21 0607 06/27/21 2129 06/28/21 0547 06/28/21 1555 06/29/21 0418 06/30/21 0537 07/01/21 0535  WBC 9.6   < > 8.8 5.4  --  9.1 18.0* 14.8*  NEUTROABS 7.6  --   --   --   --  8.4*  --  13.3*  HGB 7.1*   < > 7.3* 6.2* 7.6* 8.0* 8.0* 8.9*  HCT 23.0*   < > 24.1* 20.0* 24.3* 25.3* 25.8* 26.8*  MCV 101.8*   < > 100.4* 99.5  --  98.4 96.6 94.0  PLT 150   < > 150 116*  --  137* 130* 155   < > = values in this interval not displayed.     Cardiac Enzymes: No results for input(s): CKTOTAL, CKMB, CKMBINDEX, TROPONINI in the last 168 hours.  BNP: Invalid input(s): POCBNP  CBG: Recent Labs  Lab 07/01/21 0739 07/01/21 1518 07/01/21 2005 07/02/21 0813 07/02/21 1149  GLUCAP 95 115* 95 100* 72     Microbiology: Results for orders placed or performed during the hospital encounter of 06/26/21  Resp Panel by RT-PCR (Flu A&B, Covid) Nasopharyngeal Swab     Status: None   Collection Time: 06/26/21  8:04 AM   Specimen: Nasopharyngeal Swab; Nasopharyngeal(NP) swabs in vial transport medium  Result Value Ref Range Status   SARS Coronavirus 2 by RT PCR NEGATIVE NEGATIVE Final    Comment: (NOTE) SARS-CoV-2 target nucleic acids are NOT DETECTED.  The SARS-CoV-2 RNA is generally detectable in upper respiratory specimens during the acute phase of infection. The  lowest concentration of SARS-CoV-2 viral copies this assay can detect is 138 copies/mL. A negative result does not preclude SARS-Cov-2 infection and should not be used as the sole basis for treatment or other patient management decisions. A negative result may occur with  improper specimen collection/handling, submission of specimen other than nasopharyngeal swab, presence of viral mutation(s) within the areas targeted by this assay, and inadequate number of viral copies(<138 copies/mL). A negative result must be combined with clinical observations, patient history, and epidemiological information. The expected result is Negative.  Fact Sheet for Patients:  EntrepreneurPulse.com.au  Fact Sheet for Healthcare Providers:  IncredibleEmployment.be  This test is no t yet approved or cleared by the [place] FDA and  has been authorized for detection and/or diagnosis of SARS-CoV-2 by FDA under an Emergency Use Authorization (EUA). This EUA will remain  in effect (meaning this test can be used) for the duration of the COVID-19 declaration under Section 564(b)(1) of the Act, 21 U.S.C.section 360bbb-3(b)(1), unless the authorization is terminated  or revoked sooner.       Influenza A by PCR NEGATIVE NEGATIVE Final   Influenza B by PCR NEGATIVE NEGATIVE Final    Comment: (NOTE) The Xpert Xpress SARS-CoV-2/FLU/RSV plus assay is intended as an aid in the diagnosis of influenza from Nasopharyngeal swab specimens and should not be used as a sole basis for treatment. Nasal washings and aspirates are unacceptable for Xpert Xpress SARS-CoV-2/FLU/RSV testing.  Fact Sheet for Patients: EntrepreneurPulse.com.au  Fact Sheet for Healthcare Providers: IncredibleEmployment.be  This test is not yet approved or cleared by the [place] FDA and has been authorized for detection and/or diagnosis of SARS-CoV-2 by FDA under  an Emergency Use Authorization (EUA). This EUA will remain in effect (meaning this test can be used) for the duration of the COVID-19 declaration under Section 564(b)(1) of the Act, 21 U.S.C. section 360bbb-3(b)(1), unless the authorization is terminated or revoked.  Performed at [facility], [address] [place], [place] [address]   Culture, Respiratory w Gram Stain     Status: None   Collection Time: 06/28/21  4:00 PM   Specimen: Tracheal Aspirate; Respiratory  Result Value Ref Range Status   Specimen Description  Final    TRACHEAL ASPIRATE Performed at [facility], [address] [place], [place] [address]    Special Requests   Final    NONE Performed at [facility], [address] [place], [place] [address]    Gram Stain   Final    NO SQUAMOUS EPITHELIAL CELLS SEEN ABUNDANT WBC PRESENT,BOTH PMN AND MONONUCLEAR ABUNDANT GRAM POSITIVE COCCI IN CHAINS IN CLUSTERS MODERATE GRAM NEGATIVE RODS RARE GRAM POSITIVE RODS    Culture   Final    MODERATE Normal respiratory flora-no Staph aureus or Pseudomonas seen Performed at [facility], [place] [address], [place], [place] [address]    Report Status 06/30/2021 FINAL  Final    Coagulation Studies: No results for input(s): LABPROT, INR in the last 72 hours.  Urinalysis: No results for input(s): COLORURINE, LABSPEC, PHURINE, GLUCOSEU, HGBUR, BILIRUBINUR, KETONESUR, PROTEINUR, UROBILINOGEN, NITRITE, LEUKOCYTESUR in the last 72 hours.  Invalid input(s): APPERANCEUR    Imaging: DG Chest 2 View  Result Date: 07/02/2021 CLINICAL DATA:  Respiratory failure. EXAM: CHEST - 2 VIEW COMPARISON:  06/28/2021 FINDINGS: 0832 hours. Interval extubation. Low volume film. The cardio pericardial silhouette is enlarged. Diffuse interstitial and parahilar alveolar opacity suggests edema. No substantial pleural effusion. Telemetry leads overlie the chest. IMPRESSION: Low volume film with cardiomegaly and  pulmonary edema. Electronically Signed   By: Misty Stanley M.D.   On: 07/02/2021 11:35     Medications:    sodium chloride      sodium chloride   Intravenous Once   calcium acetate  2,001 mg Oral TID WC   Chlorhexidine Gluconate Cloth  6 each Topical Q0600   cinacalcet  30 mg Oral Q breakfast   epoetin (EPOGEN/PROCRIT) injection  10,000 Units Intravenous Q M,W,F-HD   famotidine  20 mg Oral BID   ferrous sulfate  325 mg Oral BID WC   heparin  5,000 Units Subcutaneous Q8H   hydrocortisone   Rectal TID   ipratropium-albuterol  3 mL Nebulization Q6H   mouth rinse  15 mL Mouth Rinse BID   metoprolol tartrate  50 mg Oral BID     Assessment/ Plan:  Mr. SHYMIR LAHRMAN is a 55 y.o. black male with end stage renal disease on hemodialysis, hypertension, COPD, EtOH abuse, atrial fibrillation, congestive heart failure who is admitted to [facility] on End stage renal disease ([place]) [N18.6] Atrial fibrillation with RVR ([place]) [I48.91]   [facility] MWF [facility]. Left AVF 67kg  End Stage Renal Disease: hyperkalemia, pulmonary edema and volume overload on admission.  - Received dialysis yesterday, tolerated well. Treatment was terminated early when patient was found standing at bedside, needles in place, dialysis machine still circulating.  Patient was demanding to go to bathroom.  Patient convinced to return to his bed and be provided with a bedpan.  Patient returned to bed entangled in dialysis lines, needles becoming dislodged.  It was determined at that time to continue treatment would be unsafe therefore treatment was terminated.  Patient had bowel movement in bedpan, NP notified of bloody bowel movement.  Primary team notified. - Primary team requesting extra dialysis treatment today due to complaints of shortness of breath and increased oxygen requirement.  Will offer patient 2-hour dialysis treatment today -Potassium 5.4, ordered Vetassa 16.'8mg'$  once - Next treatment scheduled for  tomorrow to maintain outpatient schedule  Hypotension: with hypertensive urgency with atrial fibrillation with rapid ventricular response on admission.  - weaned off norepinephrine - holding home blood pressure  agents.   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 - Continue calcium acetate with meals - continue cinacalcet daily  Acute respiratory failure: requiring intubation and mechanical ventilation. Secondary to pulmonary edema. Extubated and stable since dialysis.    Anemia with chronic kidney disease:  status post PRBC transfusion on 10/7.  getting outpatient Mircera as ESA.  - ESA with HD treatment    LOS: 6 [place] 10/11/20222:08 PM

## 2021-07-02 NOTE — Progress Notes (Signed)
Pt attempting to roll onto stomach with avf accessed, pt informed that he will need to stay on back if possible for rest of treatment because he is impeding the flow to his access and preventing his treatment from running and risks infiltration. Pt begins to cuss at this rn "fuck you, get that machine running, i've been doing this for eight years, just take me off". Pt then rolled onto back, no s/s of infiltration noted to avf, pt asked if he would like to continue treatment, pt said "yeah, damn it".

## 2021-07-02 NOTE — Progress Notes (Signed)
Patient refused to take phoslo pills or drink remaining veltessa this AM.  He had approx one sip of veltessa before he refused to drink anymore.

## 2021-07-02 NOTE — Progress Notes (Signed)
SUBJECTIVE: 55 y.o with significant PMH of ESRD on HD MWF although noncompliant with HD, HTN, CHF, atrial fibrillation and COPD who presented to the ED with chief complaints of acute onset worsening dyspnea, chest discomfort, and abdominal distention after missing multiple dialysis session due to being incarcerated for 1 week.  Patient sitting up in bed eating breakfast this morning. Denies chest pain, shortness of breath.    Vitals:   07/02/21 0350 07/02/21 0510 07/02/21 0558 07/02/21 0809  BP:  (!) 123/93  115/80  Pulse:  71  73  Resp: '18 18 17   '$ Temp:  98.6 F (37 C)  97.6 F (36.4 C)  TempSrc:    Oral  SpO2: 94% 90% 94% 100%  Weight:      Height:        Intake/Output Summary (Last 24 hours) at 07/02/2021 0847 Last data filed at 07/01/2021 2334 Gross per 24 hour  Intake 157.96 ml  Output 432 ml  Net -274.04 ml    LABS: Basic Metabolic Panel: Recent Labs    06/30/21 0537 07/01/21 0535 07/01/21 1120 07/02/21 0551  NA 139 139  --  137  K 5.5* 6.3* 4.1 5.4*  CL 97* 98  --  95*  CO2 25 17*  --  19*  GLUCOSE 41* 111*  --  81  BUN 89* 99*  --  90*  CREATININE 10.53* 11.83*  --  9.68*  CALCIUM 8.2* 8.4*  --  8.3*  MG 2.1  --   --   --   PHOS 8.9*  --   --   --    Liver Function Tests: Recent Labs    06/30/21 0537  ALBUMIN 3.3*   No results for input(s): LIPASE, AMYLASE in the last 72 hours. CBC: Recent Labs    06/30/21 0537 07/01/21 0535  WBC 18.0* 14.8*  NEUTROABS  --  13.3*  HGB 8.0* 8.9*  HCT 25.8* 26.8*  MCV 96.6 94.0  PLT 130* 155   Cardiac Enzymes: No results for input(s): CKTOTAL, CKMB, CKMBINDEX, TROPONINI in the last 72 hours. BNP: Invalid input(s): POCBNP D-Dimer: No results for input(s): DDIMER in the last 72 hours. Hemoglobin A1C: No results for input(s): HGBA1C in the last 72 hours. Fasting Lipid Panel: No results for input(s): CHOL, HDL, LDLCALC, TRIG, CHOLHDL, LDLDIRECT in the last 72 hours. Thyroid Function Tests: No results for  input(s): TSH, T4TOTAL, T3FREE, THYROIDAB in the last 72 hours.  Invalid input(s): FREET3 Anemia Panel: No results for input(s): VITAMINB12, FOLATE, FERRITIN, TIBC, IRON, RETICCTPCT in the last 72 hours.   PHYSICAL EXAM General: Well developed, well nourished, in no acute distress HEENT:  Normocephalic and atramatic Neck:  No JVD.  Lungs: Clear bilaterally to auscultation and percussion. Heart: HRRR . Normal S1 and S2 without gallops or murmurs.  Abdomen: Bowel sounds are positive, abdomen soft and non-tender  Msk:  Back normal, normal gait. Normal strength and tone for age. Extremities: No clubbing, cyanosis or edema.   Neuro: Alert and oriented X 3. Psych:  Good affect, responds appropriately  TELEMETRY: NSR, HR 76 bpm  ASSESSMENT AND PLAN: Paroxysmal atrial fibrillation currently in sinus rhythm with history of elevated troponin most likely due to demand ischemia.  Cardiac point of view patient can be discharged with follow-up in the office 1 to 2 weeks.  Active Problems:   ESRD on dialysis [facility])   Essential hypertension   Elevated troponin   Anemia in ESRD (end-stage renal disease) (HCC)   Acute on chronic  respiratory failure with hypoxia (HCC)   Acute on chronic combined systolic and diastolic CHF (congestive heart failure) (HCC)   Atrial fibrillation with RVR (HCC)   Thrombocytopenia (HCC)   COPD (chronic obstructive pulmonary disease) (HCC)   Hyperkalemia    Alicya Bena, FNP-C 07/02/2021 8:47 AM

## 2021-07-02 NOTE — Progress Notes (Signed)
Pt will not stop covering access with blanket, multiple time told pt that will need access visible at all time, pt cussing at this RN. Blanket removed, pt immediately covers up again. Pt told that cannot continue dialysis if access not visible. Pt states "you sure are fucking special" and uncovered access.

## 2021-07-02 NOTE — Progress Notes (Signed)
Pt again attempting to roll onto stomach, pt again told to please leave access arm still, that he is impeding the blood flow of his dialysis and risking infiltration if he does not refrain from keeping his arm out from under him. Pt again cussing, rolling eyes, but compromise made to allow pt onto his side with arm extended where I can see access at all times.

## 2021-07-02 NOTE — Progress Notes (Signed)
Pt will not leave arm still while getting blood pressure, will retake.

## 2021-07-02 NOTE — Progress Notes (Signed)
Pt admitted to 2A from ICU. Arrived via w/c. Able to stand and transfer self to bed independently. Placed back on O2 3Lnc, then RT at bedside to place on bipap. Oriented pt to staff and room.

## 2021-07-02 NOTE — Progress Notes (Signed)
Data obtained for weight at 1348

## 2021-07-02 NOTE — Progress Notes (Signed)
PROGRESS NOTE    RIHAN THOMSEN  [id number] DOB: 04-20-1966 DOA: 06/26/2021 PCP: Pcp, No   Chief complaint.  Shortness of breath. Brief Narrative:  55 y.o with significant PMH of ESRD on HD MWF although noncompliant with HD, HTN, CHF, atrial fibrillation and COPD who presented to the ED with chief complaints of acute onset worsening dyspnea, chest discomfort, and abdominal distention after missing multiple dialysis session due to being incarcerated for 1 week. 10/5: Admitted under hospitalist service for acute on chronic respiratory failure, CHF and A. fib with RVR 10/6: Rapid response called for anaphylactic/hypersensitivity reaction.  Patient transferred to the ICU.  PCCM consulted 10/7- patient on vent 70% and levophed, orolingual swelling improved post renal replacement therapy. patient is imrpoved post HD, tongue is 50% less swollen pos dialysis today.  06/29/21- Patient improved, swelling resolved, +cuff leak an dweaned to 30%FiO2 on MV, plan for SBT and liberation from vent today.    Assessment & Plan:   Active Problems:   ESRD on dialysis [facility])   Essential hypertension   Elevated troponin   Anemia in ESRD (end-stage renal disease) (HCC)   Acute on chronic respiratory failure with hypoxia (HCC)   Acute on chronic combined systolic and diastolic CHF (congestive heart failure) (HCC)   Atrial fibrillation with RVR (HCC)   Thrombocytopenia (HCC)   COPD (chronic obstructive pulmonary disease) (HCC)   Hyperkalemia  Acute on chronic respiratory failure with hypoxemia secondary to pulmonary edema. Acute on chronic combined systolic and diastolic congestive heart failure. Pericardial effusion. Moderate mitral regurgitation. Patient had a worsening hypoxia this morning, his oxygen saturations dropped to 40% with 2 L oxygen on.  He is placed on 4 L oxygen.  Discussed with nephrology, planning for short session of dialysis today. Checks x-ray seem to have worsening vascular  congestion. We will monitor patient closely in progressive unit. Had moderate amount of pericardial effusion on 06/03/2021, no evidence of cardiac tamponade.   Anemia of chronic kidney disease. End-stage renal disease with hyperkalemia Patient received 1 unit PRBC. Hemoglobin has been stable since then. Patient also had a severe hyperkalemia of 6.5, better after dialyzed yesterday.  Today's potassium still 5.4, will be dialyzed again today.  Paroxysmal atrial fibrillation with RVR. Heart rate is better controlled.  Hypoglycemia Glucose better today.  Obstructive sleep apnea. Continue CPAP while asleep.  Rectal bleeding secondary to internal hemorrhoids. Seen by Dr. Haig Prophet, patient had a colonoscopy 1 month ago, and it showed internal hemorrhoids.  Started steroids per rectum.   DVT prophylaxis: Heparin Code Status: Partial Family Communication:  Disposition Plan:      Status is: Inpatient   Remains inpatient appropriate because:Inpatient level of care appropriate due to severity of illness   Dispo: The patient is from: Home              Anticipated d/c is to: Home              Patient currently is not medically stable to d/c.              Difficult to place patient No            I/O last 3 completed shifts: In: 158 [I.V.:8; IV Piggyback:150] Out: [id number] [Other:432] Total I/O In: 240 [P.O.:240] Out: -    Consultants:  Nephrology, cardiology   Procedures: HD   Antimicrobials: None       Subjective: Patient developed significant desaturation last night, oxygen saturation dropped down to 40%.  He was on  2 L oxygen, increased to 4 L. Patient also wanted his CPAP partially last night. He feels better this morning after giving 4 L oxygen.  Still short of breath with exertion. Cough, nonproductive. No fever chills pain No dysuria hematuria  No chest pain or palpitation.  Objective: Vitals:   07/02/21 0350 07/02/21 0510 07/02/21 0558 07/02/21 0809   BP:  (!) 123/93  115/80  Pulse:  71  73  Resp: '18 18 17   '$ Temp:  98.6 F (37 C)  97.6 F (36.4 C)  TempSrc:    Oral  SpO2: 94% 90% 94% 100%  Weight:      Height:        Intake/Output Summary (Last 24 hours) at 07/02/2021 1132 Last data filed at 07/02/2021 0940 Gross per 24 hour  Intake 397.96 ml  Output 432 ml  Net -34.04 ml   Filed Weights   07/01/21 0500 07/01/21 1000 07/02/21 0210  Weight: 77.4 kg 79.5 kg 77.1 kg    Examination:  General exam: Appears calm and comfortable  Respiratory system: Decreased breathing sounds with a few crackles in the base.  Respiratory effort normal. Cardiovascular system: S1 & S2 heard, RRR. No JVD, murmurs, rubs, gallops or clicks.  Gastrointestinal system: Abdomen is nondistended, soft and nontender. No organomegaly or masses felt. Normal bowel sounds heard. Central nervous system: Alert and oriented. No focal neurological deficits. Extremities: Symmetric 5 x 5 power. Skin: No rashes, lesions or ulcers Psychiatry:  Mood & affect appropriate.     Data Reviewed: I have personally reviewed following labs and imaging studies  CBC: Recent Labs  Lab 06/26/21 0804 06/27/21 0607 06/27/21 2129 06/28/21 0547 06/28/21 1555 06/29/21 0418 06/30/21 0537 07/01/21 0535  WBC 9.6   < > 8.8 5.4  --  9.1 18.0* 14.8*  NEUTROABS 7.6  --   --   --   --  8.4*  --  13.3*  HGB 7.1*   < > 7.3* 6.2* 7.6* 8.0* 8.0* 8.9*  HCT 23.0*   < > 24.1* 20.0* 24.3* 25.3* 25.8* 26.8*  MCV 101.8*   < > 100.4* 99.5  --  98.4 96.6 94.0  PLT 150   < > 150 116*  --  137* 130* 155   < > = values in this interval not displayed.   Basic Metabolic Panel: Recent Labs  Lab 06/26/21 0804 06/26/21 1725 06/27/21 2129 06/28/21 0547 06/28/21 1555 06/29/21 0418 06/30/21 0537 07/01/21 0535 07/01/21 1120 07/02/21 0551  NA 145   < > 140 137 136 135 139 139  --  137  K 6.1*   < > 5.1 5.6* 4.5 5.1 5.5* 6.3* 4.1 5.4*  CL 102   < > 97* 98 94* 95* 97* 98  --  95*  CO2 20*    < > '23 23 26 25 25 '$ 17*  --  19*  GLUCOSE 95   < > 122* 129* 107* 120* 41* 111*  --  81  BUN 107*   < > 88* 92* 55* 62* 89* 99*  --  90*  CREATININE 18.65*   < > 13.08* 12.90* 8.49* 9.61* 10.53* 11.83*  --  9.68*  CALCIUM 9.4   < > 8.6* 8.5* 8.4* 8.7* 8.2* 8.4*  --  8.3*  MG 2.5*  --  2.2 2.0  --  1.9 2.1  --   --   --   PHOS 9.6*  --   --   --   --  8.2* 8.9*  --   --   --    < > =  values in this interval not displayed.   GFR: Estimated Creatinine Clearance: 8.3 mL/min (A) (by C-G formula based on SCr of 9.68 mg/dL (H)). Liver Function Tests: Recent Labs  Lab 06/26/21 0804 06/30/21 0537  AST 20  --   ALT 14  --   ALKPHOS 102  --   BILITOT 1.6*  --   PROT 8.8*  --   ALBUMIN 3.9 3.3*   No results for input(s): LIPASE, AMYLASE in the last 168 hours. No results for input(s): AMMONIA in the last 168 hours. Coagulation Profile: No results for input(s): INR, PROTIME in the last 168 hours. Cardiac Enzymes: No results for input(s): CKTOTAL, CKMB, CKMBINDEX, TROPONINI in the last 168 hours. BNP (last 3 results) No results for input(s): PROBNP in the last 8760 hours. HbA1C: No results for input(s): HGBA1C in the last 72 hours. CBG: Recent Labs  Lab 06/30/21 2238 07/01/21 0739 07/01/21 1518 07/01/21 2005 07/02/21 0813  GLUCAP 91 95 115* 95 100*   Lipid Profile: No results for input(s): CHOL, HDL, LDLCALC, TRIG, CHOLHDL, LDLDIRECT in the last 72 hours. Thyroid Function Tests: No results for input(s): TSH, T4TOTAL, FREET4, T3FREE, THYROIDAB in the last 72 hours. Anemia Panel: No results for input(s): VITAMINB12, FOLATE, FERRITIN, TIBC, IRON, RETICCTPCT in the last 72 hours. Sepsis Labs: Recent Labs  Lab 06/26/21 0804  LATICACIDVEN 1.6    Recent Results (from the past 240 hour(s))  Resp Panel by RT-PCR (Flu A&B, Covid) Nasopharyngeal Swab     Status: None   Collection Time: 06/26/21  8:04 AM   Specimen: Nasopharyngeal Swab; Nasopharyngeal(NP) swabs in vial transport  medium  Result Value Ref Range Status   SARS Coronavirus 2 by RT PCR NEGATIVE NEGATIVE Final    Comment: (NOTE) SARS-CoV-2 target nucleic acids are NOT DETECTED.  The SARS-CoV-2 RNA is generally detectable in upper respiratory specimens during the acute phase of infection. The lowest concentration of SARS-CoV-2 viral copies this assay can detect is 138 copies/mL. A negative result does not preclude SARS-Cov-2 infection and should not be used as the sole basis for treatment or other patient management decisions. A negative result may occur with  improper specimen collection/handling, submission of specimen other than nasopharyngeal swab, presence of viral mutation(s) within the areas targeted by this assay, and inadequate number of viral copies(<138 copies/mL). A negative result must be combined with clinical observations, patient history, and epidemiological information. The expected result is Negative.  Fact Sheet for Patients:  EntrepreneurPulse.com.au  Fact Sheet for Healthcare Providers:  IncredibleEmployment.be  This test is no t yet approved or cleared by the [place] FDA and  has been authorized for detection and/or diagnosis of SARS-CoV-2 by FDA under an Emergency Use Authorization (EUA). This EUA will remain  in effect (meaning this test can be used) for the duration of the COVID-19 declaration under Section 564(b)(1) of the Act, 21 U.S.C.section 360bbb-3(b)(1), unless the authorization is terminated  or revoked sooner.       Influenza A by PCR NEGATIVE NEGATIVE Final   Influenza B by PCR NEGATIVE NEGATIVE Final    Comment: (NOTE) The Xpert Xpress SARS-CoV-2/FLU/RSV plus assay is intended as an aid in the diagnosis of influenza from Nasopharyngeal swab specimens and should not be used as a sole basis for treatment. Nasal washings and aspirates are unacceptable for Xpert Xpress SARS-CoV-2/FLU/RSV testing.  Fact Sheet for  Patients: EntrepreneurPulse.com.au  Fact Sheet for Healthcare Providers: IncredibleEmployment.be  This test is not yet approved or cleared by the [place] FDA  and has been authorized for detection and/or diagnosis of SARS-CoV-2 by FDA under an Emergency Use Authorization (EUA). This EUA will remain in effect (meaning this test can be used) for the duration of the COVID-19 declaration under Section 564(b)(1) of the Act, 21 U.S.C. section 360bbb-3(b)(1), unless the authorization is terminated or revoked.  Performed at [facility], [address] [place], [place] [address]   Culture, Respiratory w Gram Stain     Status: None   Collection Time: 06/28/21  4:00 PM   Specimen: Tracheal Aspirate; Respiratory  Result Value Ref Range Status   Specimen Description   Final    TRACHEAL ASPIRATE Performed at [facility], [address], [place], [place] [address]    Special Requests   Final    NONE Performed at [facility], [address] [place], [place] [address]    Gram Stain   Final    NO SQUAMOUS EPITHELIAL CELLS SEEN ABUNDANT WBC PRESENT,BOTH PMN AND MONONUCLEAR ABUNDANT GRAM POSITIVE COCCI IN CHAINS IN CLUSTERS MODERATE GRAM NEGATIVE RODS RARE GRAM POSITIVE RODS    Culture   Final    MODERATE Normal respiratory flora-no Staph aureus or Pseudomonas seen Performed at [facility], [place] [address], [place],  [address]    Report Status 06/30/2021 FINAL  Final         Radiology Studies: No results found.      Scheduled Meds:  sodium chloride   Intravenous Once   calcium acetate  2,001 mg Oral TID WC   Chlorhexidine Gluconate Cloth  6 each Topical Q0600   cinacalcet  30 mg Oral Q breakfast   epoetin (EPOGEN/PROCRIT) injection  10,000 Units Intravenous Q M,W,F-HD   famotidine  20 mg Oral BID   ferrous sulfate  325 mg Oral BID WC   heparin  5,000 Units Subcutaneous Q8H    hydrocortisone   Rectal TID   ipratropium-albuterol  3 mL Nebulization Q6H   mouth rinse  15 mL Mouth Rinse BID   metoprolol tartrate  50 mg Oral BID   Continuous Infusions:  sodium chloride       LOS: 6 days    Time spent: 32 minutes    Sharen Hones, MD [facility]   To contact the attending provider between 7A-7P or the covering provider during after hours 7P-7A, please log into the web site [email or web address] and access using universal [place] password for that web site. If you do not have the password, please call the hospital operator.  07/02/2021, 11:32 AM

## 2021-07-02 NOTE — Progress Notes (Signed)
Assumed pt care from [facility]

## 2021-07-02 NOTE — Progress Notes (Signed)
Pt was initially started HD on machine 7, after approx 1 min on machine, machine had TMP failure. Pt rinsed back and will restart on different machine.

## 2021-07-02 NOTE — Progress Notes (Signed)
Data obtained at 1426, entered time in error.

## 2021-07-03 ENCOUNTER — Inpatient Hospital Stay: Payer: Medicare Other

## 2021-07-03 ENCOUNTER — Encounter: Payer: Self-pay | Admitting: Family Medicine

## 2021-07-03 DIAGNOSIS — N186 End stage renal disease: Secondary | ICD-10-CM | POA: Diagnosis not present

## 2021-07-03 DIAGNOSIS — J9621 Acute and chronic respiratory failure with hypoxia: Secondary | ICD-10-CM | POA: Diagnosis not present

## 2021-07-03 DIAGNOSIS — I5043 Acute on chronic combined systolic (congestive) and diastolic (congestive) heart failure: Secondary | ICD-10-CM | POA: Diagnosis not present

## 2021-07-03 DIAGNOSIS — D631 Anemia in chronic kidney disease: Secondary | ICD-10-CM | POA: Diagnosis not present

## 2021-07-03 LAB — CBC WITH DIFFERENTIAL/PLATELET
Abs Immature Granulocytes: 0.16 10*3/uL — ABNORMAL HIGH (ref 0.00–0.07)
Basophils Absolute: 0 10*3/uL (ref 0.0–0.1)
Basophils Relative: 0 %
Eosinophils Absolute: 0.1 10*3/uL (ref 0.0–0.5)
Eosinophils Relative: 0 %
HCT: 27.1 % — ABNORMAL LOW (ref 39.0–52.0)
Hemoglobin: 8.3 g/dL — ABNORMAL LOW (ref 13.0–17.0)
Immature Granulocytes: 1 %
Lymphocytes Relative: 3 %
Lymphs Abs: 0.5 10*3/uL — ABNORMAL LOW (ref 0.7–4.0)
MCH: 31.8 pg (ref 26.0–34.0)
MCHC: 30.6 g/dL (ref 30.0–36.0)
MCV: 103.8 fL — ABNORMAL HIGH (ref 80.0–100.0)
Monocytes Absolute: 0.8 10*3/uL (ref 0.1–1.0)
Monocytes Relative: 5 %
Neutro Abs: 15 10*3/uL — ABNORMAL HIGH (ref 1.7–7.7)
Neutrophils Relative %: 91 %
Platelets: 135 10*3/uL — ABNORMAL LOW (ref 150–400)
RBC: 2.61 MIL/uL — ABNORMAL LOW (ref 4.22–5.81)
RDW: 15.8 % — ABNORMAL HIGH (ref 11.5–15.5)
WBC: 16.4 10*3/uL — ABNORMAL HIGH (ref 4.0–10.5)
nRBC: 0.2 % (ref 0.0–0.2)

## 2021-07-03 LAB — GLUCOSE, CAPILLARY
Glucose-Capillary: 65 mg/dL — ABNORMAL LOW (ref 70–99)
Glucose-Capillary: 93 mg/dL (ref 70–99)
Glucose-Capillary: 68 mg/dL — ABNORMAL LOW (ref 70–99)

## 2021-07-03 IMAGING — CR DG ABDOMEN 1V
1 series · 1 of 1 positions shown · non-contrast
Comparison: None.

CLINICAL DATA: Abdominal pain

EXAM:
ABDOMEN - 1 VIEW

[dg abd 1 view]
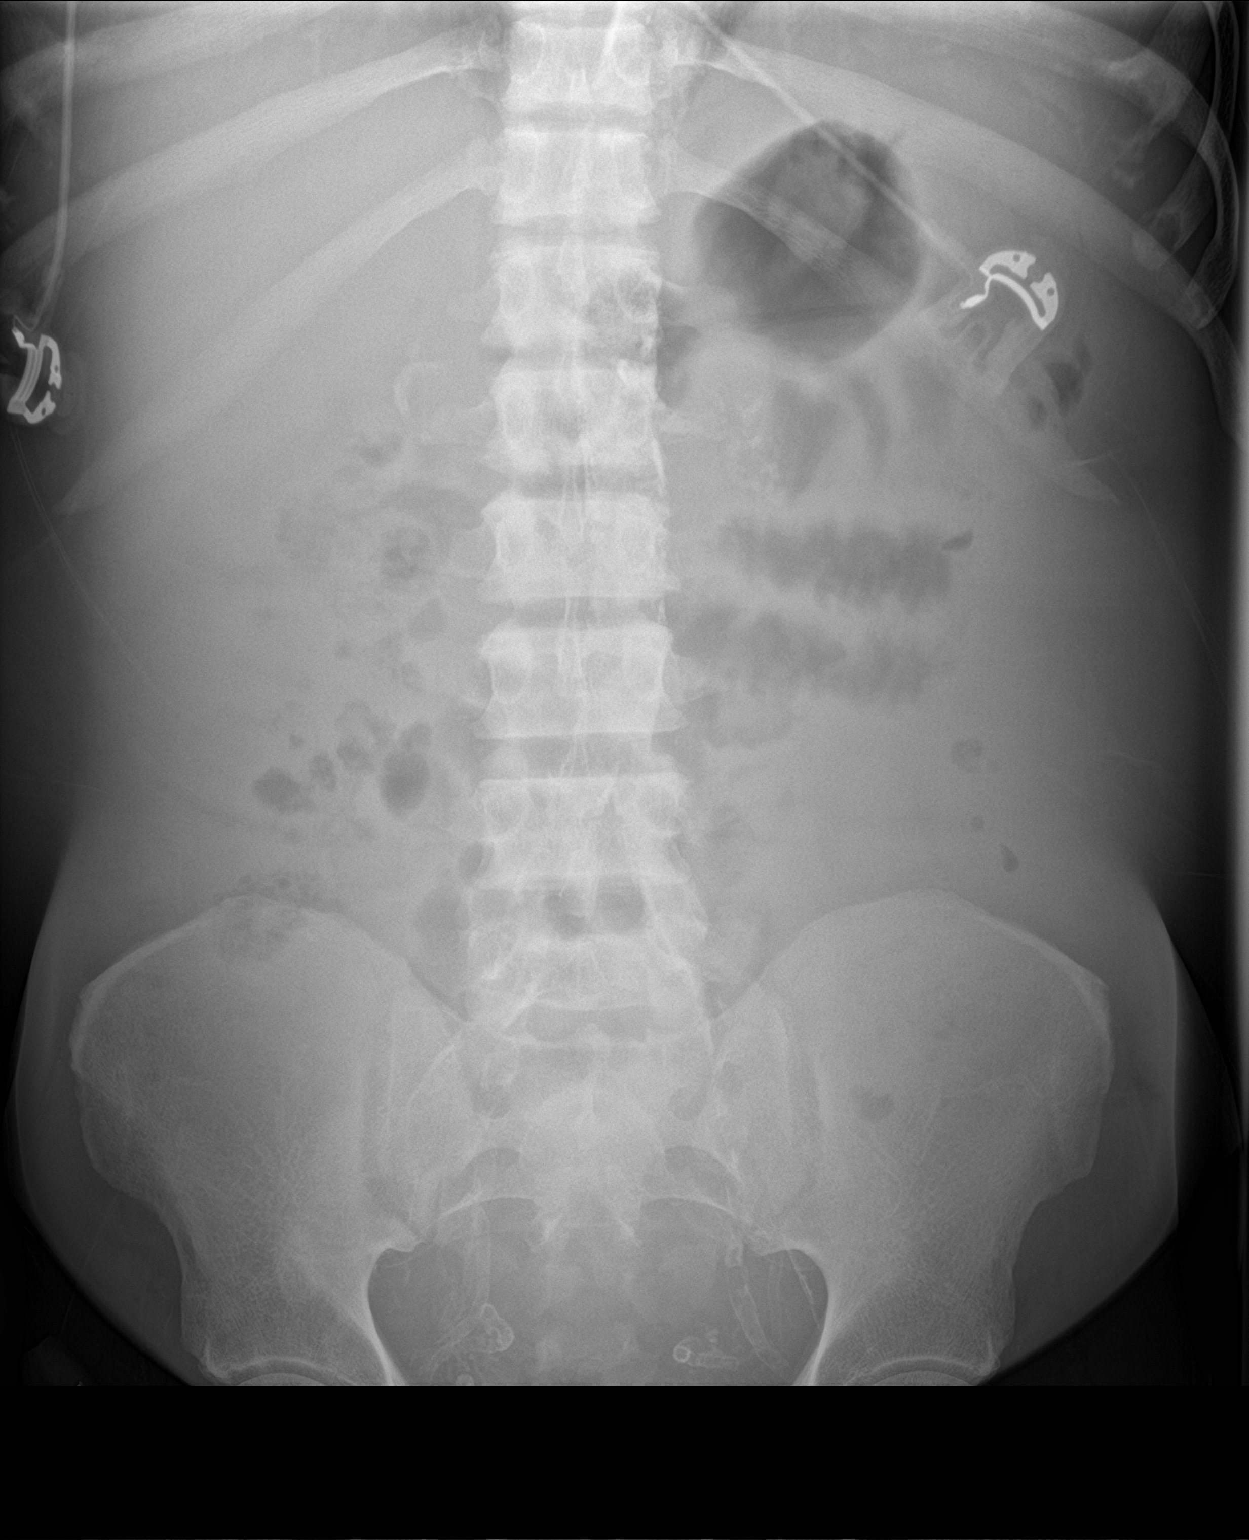

[1 of 1 positions shown; findings below may reference images not displayed]

FINDINGS: There is loss of the properitoneal fat flank stripes and central
crowding of aerated loops of bowel suggesting underlying ascites.
Underpenetration precludes evaluation of the visceral shadows.
Nonobstructive bowel gas pattern. Calcifications within the mid
abdomen correspond to renovascular calcifications. Advanced vascular
calcifications noted within the pelvis. No gross free
intraperitoneal gas.
IMPRESSION: Suspected at least moderate ascites.

Nonobstructive bowel gas pattern.

.

## 2021-07-03 MED ORDER — EPOETIN ALFA 10000 UNIT/ML IJ SOLN
INTRAMUSCULAR | Status: AC
  Filled 2021-07-03: qty 1

## 2021-07-03 MED ORDER — HYDROCERIN EX CREA
TOPICAL_CREAM | Freq: Two times a day (BID) | CUTANEOUS | Status: DC
  Filled 2021-07-03: qty 113

## 2021-07-03 NOTE — Progress Notes (Addendum)
Progress Note    GRAVIEL [name]  [id number] DOB: 03-23-1966  DOA: 06/26/2021 PCP: Pcp, No      Brief Narrative:    Medical records reviewed and are as summarized below:  Jordan [name] is a 55 y.o. male with medical history significant for ESRD, hypertension, COPD, chronic systolic and diastolic CHF, who presented to the hospital because of shortness of breath, chest discomfort and abdominal distention after missing multiple dialysis sessions.  He said he had missed dialysis because he had been incarcerated for a week.      Assessment/Plan:   Active Problems:   ESRD on dialysis [facility])   Essential hypertension   Elevated troponin   Anemia in ESRD (end-stage renal disease) (HCC)   Acute on chronic respiratory failure with hypoxia (HCC)   Acute on chronic combined systolic and diastolic CHF (congestive heart failure) (HCC)   Atrial fibrillation with RVR (HCC)   Thrombocytopenia (HCC)   COPD (chronic obstructive pulmonary disease) (HCC)   Hyperkalemia    Body mass index is 32.2 kg/m.  (Obesity)  Acute on chronic systolic and diastolic CHF: He is on hemodialysis for fluid management.  ESRD with volume overload, hyperkalemia: Edema and has improved but he has fluctuating potassium levels.  He had hemodialysis today.  Repeat BMP tomorrow.  Follow-up with nephrologist for hemodialysis.  Acute on chronic hypoxemic respiratory failure: He has been slowly weaned down from 9 L/min to 2 L/min oxygen via nasal cannula.  Anemia of chronic kidney disease: S/p transfusion with 1 unit of packed red blood cells.  H&H is stable.  Paroxysmal atrial fibrillation with RVR: Heart rate is better.  Continue metoprolol.  Oral lingual swelling: Likely from anasarca/fluid overload.  Improved.  Leukocytosis: Probably reactive.  Repeat CBC tomorrow.  Hypoglycemia: Improved  COPD: Continue bronchodilators  Rectal bleeding, internal hemorrhoids: He had colonoscopy about 1 month  ago.  Outpatient follow-up with gastroenterologist.  Possible discharge to home tomorrow if he remains stable.  Diet Order             Diet renal/carb modified with fluid restriction Diet-HS Snack? Nothing; Fluid restriction: 1200 mL Fluid; Room service appropriate? Yes; Fluid consistency: Thin  Diet effective now                      Consultants: Nephrologist, cardiologist, intensivist  Procedures: None    Medications:    sodium chloride   Intravenous Once   calcium acetate  2,001 mg Oral TID WC   Chlorhexidine Gluconate Cloth  6 each Topical Q0600   cinacalcet  30 mg Oral Q breakfast   epoetin alfa       epoetin (EPOGEN/PROCRIT) injection  10,000 Units Intravenous Q M,W,F-HD   famotidine  20 mg Oral BID   ferrous sulfate  325 mg Oral BID WC   heparin  5,000 Units Subcutaneous Q8H   hydrocerin   Topical BID   hydrocortisone   Rectal TID   ipratropium-albuterol  3 mL Nebulization BID   mouth rinse  15 mL Mouth Rinse BID   metoprolol tartrate  50 mg Oral BID   Continuous Infusions:  sodium chloride       Anti-infectives (From admission, onward)    None              Family Communication/Anticipated D/C date and plan/Code Status   DVT prophylaxis: heparin injection 5,000 Units Start: 06/30/21 1400 SCDs Start: 06/30/21 0954     Code Status: Partial  Code  Family Communication: None Disposition Plan:    Status is: Inpatient  Remains inpatient appropriate because:Inpatient level of care appropriate due to severity of illness  Dispo: The patient is from: Home              Anticipated d/c is to: Home              Patient currently is not medically stable to d/c.   Difficult to place patient No           Subjective:   Interval events noted.  No shortness of breath or chest pain.  Objective:    Vitals:   07/03/21 1145 07/03/21 1200 07/03/21 1215 07/03/21 1428  BP: (!) 154/94 140/87 (!) 123/107 125/67  Pulse:    88  Resp: (!)  23 (!) 21 (!) 21 17  Temp:    98.3 F (36.8 C)  TempSrc:      SpO2:    97%  Weight:      Height:       No data found.   Intake/Output Summary (Last 24 hours) at 07/03/2021 1643 Last data filed at 07/03/2021 1200 Gross per 24 hour  Intake 120 ml  Output 2002 ml  Net -1882 ml   Filed Weights   07/03/21 0452 07/03/21 0752 07/03/21 0823  Weight: 78 kg 78 kg 85.1 kg    Exam:  GEN: NAD SKIN: Warm and dry EYES: EOMI ENT: MMM CV: RRR PULM: CTA B ABD: soft, ND, NT, +BS CNS: AAO x 3, non focal EXT: No edema or tenderness        Data Reviewed:   I have personally reviewed following labs and imaging studies:  Labs: Labs show the following:   Basic Metabolic Panel: Recent Labs  Lab 06/27/21 2129 06/28/21 0547 06/28/21 1555 06/29/21 0418 06/30/21 0537 07/01/21 0535 07/01/21 1120 07/02/21 0551  NA 140 137 136 135 139 139  --  137  K 5.1 5.6* 4.5 5.1 5.5* 6.3* 4.1 5.4*  CL 97* 98 94* 95* 97* 98  --  95*  CO2 '23 23 26 25 25 '$ 17*  --  19*  GLUCOSE 122* 129* 107* 120* 41* 111*  --  81  BUN 88* 92* 55* 62* 89* 99*  --  90*  CREATININE 13.08* 12.90* 8.49* 9.61* 10.53* 11.83*  --  9.68*  CALCIUM 8.6* 8.5* 8.4* 8.7* 8.2* 8.4*  --  8.3*  MG 2.2 2.0  --  1.9 2.1  --   --   --   PHOS  --   --   --  8.2* 8.9*  --   --   --    GFR Estimated Creatinine Clearance: 8.7 mL/min (A) (by C-G formula based on SCr of 9.68 mg/dL (H)). Liver Function Tests: Recent Labs  Lab 06/30/21 0537  ALBUMIN 3.3*   No results for input(s): LIPASE, AMYLASE in the last 168 hours. No results for input(s): AMMONIA in the last 168 hours. Coagulation profile No results for input(s): INR, PROTIME in the last 168 hours.  CBC: Recent Labs  Lab 06/28/21 0547 06/28/21 1555 06/29/21 0418 06/30/21 0537 07/01/21 0535 07/03/21 0430  WBC 5.4  --  9.1 18.0* 14.8* 16.4*  NEUTROABS  --   --  8.4*  --  13.3* 15.0*  HGB 6.2* 7.6* 8.0* 8.0* 8.9* 8.3*  HCT 20.0* 24.3* 25.3* 25.8* 26.8* 27.1*  MCV  99.5  --  98.4 96.6 94.0 103.8*  PLT 116*  --  137* 130* 155  135*   Cardiac Enzymes: No results for input(s): CKTOTAL, CKMB, CKMBINDEX, TROPONINI in the last 168 hours. BNP (last 3 results) No results for input(s): PROBNP in the last 8760 hours. CBG: Recent Labs  Lab 07/01/21 2005 07/02/21 0813 07/02/21 1149 07/02/21 2214 07/03/21 0723  GLUCAP 95 100* 72 110* 65*   D-Dimer: No results for input(s): DDIMER in the last 72 hours. Hgb A1c: No results for input(s): HGBA1C in the last 72 hours. Lipid Profile: No results for input(s): CHOL, HDL, LDLCALC, TRIG, CHOLHDL, LDLDIRECT in the last 72 hours. Thyroid function studies: No results for input(s): TSH, T4TOTAL, T3FREE, THYROIDAB in the last 72 hours.  Invalid input(s): FREET3 Anemia work up: No results for input(s): VITAMINB12, FOLATE, FERRITIN, TIBC, IRON, RETICCTPCT in the last 72 hours. Sepsis Labs: Recent Labs  Lab 06/29/21 0418 06/30/21 0537 07/01/21 0535 07/03/21 0430  WBC 9.1 18.0* 14.8* 16.4*    Microbiology Recent Results (from the past 240 hour(s))  Resp Panel by RT-PCR (Flu A&B, Covid) Nasopharyngeal Swab     Status: None   Collection Time: 06/26/21  8:04 AM   Specimen: Nasopharyngeal Swab; Nasopharyngeal(NP) swabs in vial transport medium  Result Value Ref Range Status   SARS Coronavirus 2 by RT PCR NEGATIVE NEGATIVE Final    Comment: (NOTE) SARS-CoV-2 target nucleic acids are NOT DETECTED.  The SARS-CoV-2 RNA is generally detectable in upper respiratory specimens during the acute phase of infection. The lowest concentration of SARS-CoV-2 viral copies this assay can detect is 138 copies/mL. A negative result does not preclude SARS-Cov-2 infection and should not be used as the sole basis for treatment or other patient management decisions. A negative result may occur with  improper specimen collection/handling, submission of specimen other than nasopharyngeal swab, presence of viral mutation(s) within  the areas targeted by this assay, and inadequate number of viral copies(<138 copies/mL). A negative result must be combined with clinical observations, patient history, and epidemiological information. The expected result is Negative.  Fact Sheet for Patients:  EntrepreneurPulse.com.au  Fact Sheet for Healthcare Providers:  IncredibleEmployment.be  This test is no t yet approved or cleared by the [place] FDA and  has been authorized for detection and/or diagnosis of SARS-CoV-2 by FDA under an Emergency Use Authorization (EUA). This EUA will remain  in effect (meaning this test can be used) for the duration of the COVID-19 declaration under Section 564(b)(1) of the Act, 21 U.S.C.section 360bbb-3(b)(1), unless the authorization is terminated  or revoked sooner.       Influenza A by PCR NEGATIVE NEGATIVE Final   Influenza B by PCR NEGATIVE NEGATIVE Final    Comment: (NOTE) The Xpert Xpress SARS-CoV-2/FLU/RSV plus assay is intended as an aid in the diagnosis of influenza from Nasopharyngeal swab specimens and should not be used as a sole basis for treatment. Nasal washings and aspirates are unacceptable for Xpert Xpress SARS-CoV-2/FLU/RSV testing.  Fact Sheet for Patients: EntrepreneurPulse.com.au  Fact Sheet for Healthcare Providers: IncredibleEmployment.be  This test is not yet approved or cleared by the [place] FDA and has been authorized for detection and/or diagnosis of SARS-CoV-2 by FDA under an Emergency Use Authorization (EUA). This EUA will remain in effect (meaning this test can be used) for the duration of the COVID-19 declaration under Section 564(b)(1) of the Act, 21 U.S.C. section 360bbb-3(b)(1), unless the authorization is terminated or revoked.  Performed at [facility], [address], [place], [place] [address]   Culture, Respiratory w Gram Stain  Status: None    Collection Time: 06/28/21  4:00 PM   Specimen: Tracheal Aspirate; Respiratory  Result Value Ref Range Status   Specimen Description   Final    TRACHEAL ASPIRATE Performed at [facility], [address] [place], [place] [address]    Special Requests   Final    NONE Performed at [facility], [address] [place], [place] [address]    Gram Stain   Final    NO SQUAMOUS EPITHELIAL CELLS SEEN ABUNDANT WBC PRESENT,BOTH PMN AND MONONUCLEAR ABUNDANT GRAM POSITIVE COCCI IN CHAINS IN CLUSTERS MODERATE GRAM NEGATIVE RODS RARE GRAM POSITIVE RODS    Culture   Final    MODERATE Normal respiratory flora-no Staph aureus or Pseudomonas seen Performed at [facility], [place] [address], [place], [place] [address]    Report Status 06/30/2021 FINAL  Final    Procedures and diagnostic studies:  DG Chest 2 View  Result Date: 07/02/2021 CLINICAL DATA:  Respiratory failure. EXAM: CHEST - 2 VIEW COMPARISON:  06/28/2021 FINDINGS: 0832 hours. Interval extubation. Low volume film. The cardio pericardial silhouette is enlarged. Diffuse interstitial and parahilar alveolar opacity suggests edema. No substantial pleural effusion. Telemetry leads overlie the chest. IMPRESSION: Low volume film with cardiomegaly and pulmonary edema. Electronically Signed   By: Misty Stanley M.D.   On: 07/02/2021 11:35               LOS: 7 days   Tamkia Temples  [facility]   Pager on [email or web address]. If 7PM-7AM, please contact night-coverage at [email or web address]     07/03/2021, 4:43 PM

## 2021-07-03 NOTE — Progress Notes (Signed)
Pt on continuous pulse ox but around 2200, difficult to obtain O2 sat, with finger or ear probe, finally able to get reading with nasal clip and 84% on 4Lnc. Pt denies SOB. Alert but states month March and unsure of year but pt irritated at RN asking questions. Raising voice at RN, keeps removing O2, refusing meds stating "leave me alone, I just want to sleep". Refusing bipap.   NP notified and at bedside. Pt more calm and states year and month correctly for NP. ABG ordered and placed on high flow cannula per verbal order - titrated as high as 9 liters to keep sats >90. Refused bipap. Pt later more cooperative but still refusing evening meds.  Pt slept well overnight and as night progressed pt more cooperative, less irritable, O2 weaned back down to 4L and sats >90%.

## 2021-07-03 NOTE — Progress Notes (Signed)
PT Cancellation Note  Patient Details Name: Jordan [name] MRN: [id number] DOB: 05-22-1966   Cancelled Treatment:     PT attempt. Pt off floor in HD. Will return at later time date when pt is available to participate.   Willette Pa 07/03/2021, 10:40 AM

## 2021-07-03 NOTE — Progress Notes (Signed)
Physical Therapy Treatment Patient Details Name: Jordan [name] MRN: [id number] DOB: 1966/03/25 Today's Date: 07/03/2021   History of Present Illness Patient is a 55 y.o with significant PMH of ESRD on HD MWF although noncompliant with HD, HTN, CHF, atrial fibrillation and COPD who presented to the ED with chief complaints of acute onset worsening dyspnea, chest discomfort, and abdominal distention after missing multiple dialysis session due to being incarcerated for 1 week. Admitted under hospitalist service for acute on chronic respiratory failure, CHF and A. fib with RVR. Patient required intubation but is now extubated.    PT Comments    Pt was standing peeing in [facility] upon arriving. He had no O2 on with sao2 84%. Applied 3 L o2 throughout the remainder of session with sao2 >90 %. He was easily able to stand and ambulate without AD. Does endorse fatigue from HD earlier but overall tolerated session well. Recommend DC home with HHPT to address deficits with activity tolerance, higher level balance, and overall endurance concerns.    Recommendations for follow up therapy are one component of a multi-disciplinary discharge planning process, led by the attending physician.  Recommendations may be updated based on patient status, additional functional criteria and insurance authorization.  Follow Up Recommendations  Home health PT;Supervision - Intermittent     Equipment Recommendations  None recommended by PT       Precautions / Restrictions Precautions Precautions: Fall Precaution Comments: O2 Restrictions Weight Bearing Restrictions: No     Mobility  Bed Mobility Overal bed mobility: Modified Independent   Transfers Overall transfer level: Needs assistance Equipment used: None Transfers: Sit to/from Stand Sit to Stand: Supervision   Ambulation/Gait Ambulation/Gait assistance: Supervision Gait Distance (Feet): 120 Feet Assistive device: None Gait Pattern/deviations:  Step-through pattern Gait velocity: decreased   General Gait Details: distance limited by fatigue. required 3 L o2 to maintain > 90% sao2   Balance Overall balance assessment: Needs assistance Sitting-balance support: Feet supported Sitting balance-Leahy Scale: Good     Standing balance support: No upper extremity supported Standing balance-Leahy Scale: Good Standing balance comment: no LOB during all standing activity without UE support or AD      Cognition Arousal/Alertness: Lethargic (fatigued form HD and activities throughout the day.) Behavior During Therapy: Impulsive Overall Cognitive Status: Within Functional Limits for tasks assessed    General Comments: Pt is A and oriented but impulsive. per RN," He is going to do what he wants."             Pertinent Vitals/Pain Pain Assessment: 0-10 Pain Score: 3  Faces Pain Scale: Hurts a little bit Pain Location:  (stomach) Pain Descriptors / Indicators: Discomfort Pain Intervention(s): Limited activity within patient's tolerance;Monitored during session;Repositioned     PT Goals (current goals can now be found in the care plan section) Acute Rehab PT Goals Patient Stated Goal: to go home Progress towards PT goals: Progressing toward goals    Frequency    Min 2X/week      PT Plan Current plan remains appropriate       AM-PAC PT "6 Clicks" Mobility   Outcome Measure  Help needed turning from your back to your side while in a flat bed without using bedrails?: None Help needed moving from lying on your back to sitting on the side of a flat bed without using bedrails?: A Little Help needed moving to and from a bed to a chair (including a wheelchair)?: A Little Help needed standing up from a chair using  your arms (e.g., wheelchair or bedside chair)?: A Little Help needed to walk in hospital room?: A Little Help needed climbing 3-5 steps with a railing? : A Little 6 Click Score: 19    End of Session Equipment  Utilized During Treatment: Oxygen (3L) Activity Tolerance: Patient limited by fatigue Patient left: in bed;with call bell/phone within reach;with bed alarm set Nurse Communication: Mobility status PT Visit Diagnosis: Unsteadiness on feet (R26.81);Muscle weakness (generalized) (M62.81)     Time: [id number] PT Time Calculation (min) (ACUTE ONLY): 13 min  Charges:  $Gait Training: 8-22 mins                    Julaine Fusi PTA 07/03/21, 4:48 PM

## 2021-07-03 NOTE — Progress Notes (Signed)
[facility]  ROUNDING NOTE   Subjective:   Jordan [name] was admitted to [facility] on 06/26/2021 for End stage renal disease ([place]) [N18.6] Atrial fibrillation with RVR ([place]) [I48.91]  With acute respiratory failure requiring intubated   Patient seen and evaluated during dialysis   HEMODIALYSIS FLOWSHEET:  Blood Flow Rate (mL/min): 400 mL/min Arterial Pressure (mmHg): -190 mmHg Venous Pressure (mmHg): 220 mmHg Transmembrane Pressure (mmHg): 70 mmHg Ultrafiltration Rate (mL/min): 830 mL/min Dialysate Flow Rate (mL/min): 500 ml/min Conductivity: Machine : 13.5 Conductivity: Machine : 13.5 Dialysis Fluid Bolus: Normal Saline Bolus Amount (mL): 250 mL  No complaints at this time   Objective:  Vital signs in last 24 hours:  Temp:  [97.6 F (36.4 C)-98.8 F (37.1 C)] 98.2 F (36.8 C) (10/12 0823) Pulse Rate:  [70-77] 77 (10/12 0722) Resp:  [10-28] 21 (10/12 1215) BP: (102-168)/(36-111) 123/107 (10/12 1215) SpO2:  [84 %-100 %] 98 % (10/12 0722) Weight:  [78 kg-85.1 kg] 85.1 kg (10/12 0823)  Weight change: -0.5 kg Filed Weights   07/03/21 0452 07/03/21 0752 07/03/21 0823  Weight: 78 kg 78 kg 85.1 kg    Intake/Output: I/O last 3 completed shifts: In: 518 [P.O.:360; I.V.:8; IV Piggyback:150] Out: 251 [Other:251]   Intake/Output this shift:  Total I/O In: -  Out: 2002 [Other:2002]  Physical Exam: General: NAD  Head: Moist oral mucus membranes  Eyes: Anicteric  Lungs:  Diminished bilaterally, 4L [place] O2  Heart: Regular rate and rhythm   Abdomen:  Soft, nontender  Extremities:  no peripheral edema.  Neurologic: Alert and oriented  Skin: Multiple abrasions  Access: Left aneurysmal AVF    Basic Metabolic Panel: Recent Labs  Lab 06/27/21 2129 06/28/21 0547 06/28/21 1555 06/29/21 0418 06/30/21 0537 07/01/21 0535 07/01/21 1120 07/02/21 0551  NA 140 137 136 135 139 139  --  137  K 5.1 5.6* 4.5 5.1 5.5* 6.3* 4.1 5.4*  CL 97* 98 94* 95* 97* 98   --  95*  CO2 '23 23 26 25 25 '$ 17*  --  19*  GLUCOSE 122* 129* 107* 120* 41* 111*  --  81  BUN 88* 92* 55* 62* 89* 99*  --  90*  CREATININE 13.08* 12.90* 8.49* 9.61* 10.53* 11.83*  --  9.68*  CALCIUM 8.6* 8.5* 8.4* 8.7* 8.2* 8.4*  --  8.3*  MG 2.2 2.0  --  1.9 2.1  --   --   --   PHOS  --   --   --  8.2* 8.9*  --   --   --      Liver Function Tests: Recent Labs  Lab 06/30/21 0537  ALBUMIN 3.3*    No results for input(s): LIPASE, AMYLASE in the last 168 hours. No results for input(s): AMMONIA in the last 168 hours.  CBC: Recent Labs  Lab 06/28/21 0547 06/28/21 1555 06/29/21 0418 06/30/21 0537 07/01/21 0535 07/03/21 0430  WBC 5.4  --  9.1 18.0* 14.8* 16.4*  NEUTROABS  --   --  8.4*  --  13.3* 15.0*  HGB 6.2* 7.6* 8.0* 8.0* 8.9* 8.3*  HCT 20.0* 24.3* 25.3* 25.8* 26.8* 27.1*  MCV 99.5  --  98.4 96.6 94.0 103.8*  PLT 116*  --  137* 130* 155 135*     Cardiac Enzymes: No results for input(s): CKTOTAL, CKMB, CKMBINDEX, TROPONINI in the last 168 hours.  BNP: Invalid input(s): POCBNP  CBG: Recent Labs  Lab 07/01/21 2005 07/02/21 0813 07/02/21 1149 07/02/21 2214 07/03/21 [id number]  GLUCAP 95 100* 72 110* 65*     Microbiology: Results for orders placed or performed during the hospital encounter of 06/26/21  Resp Panel by RT-PCR (Flu A&B, Covid) Nasopharyngeal Swab     Status: None   Collection Time: 06/26/21  8:04 AM   Specimen: Nasopharyngeal Swab; Nasopharyngeal(NP) swabs in vial transport medium  Result Value Ref Range Status   SARS Coronavirus 2 by RT PCR NEGATIVE NEGATIVE Final    Comment: (NOTE) SARS-CoV-2 target nucleic acids are NOT DETECTED.  The SARS-CoV-2 RNA is generally detectable in upper respiratory specimens during the acute phase of infection. The lowest concentration of SARS-CoV-2 viral copies this assay can detect is 138 copies/mL. A negative result does not preclude SARS-Cov-2 infection and should not be used as the sole basis for treatment  or other patient management decisions. A negative result may occur with  improper specimen collection/handling, submission of specimen other than nasopharyngeal swab, presence of viral mutation(s) within the areas targeted by this assay, and inadequate number of viral copies(<138 copies/mL). A negative result must be combined with clinical observations, patient history, and epidemiological information. The expected result is Negative.  Fact Sheet for Patients:  EntrepreneurPulse.com.au  Fact Sheet for Healthcare Providers:  IncredibleEmployment.be  This test is no t yet approved or cleared by the [place] FDA and  has been authorized for detection and/or diagnosis of SARS-CoV-2 by FDA under an Emergency Use Authorization (EUA). This EUA will remain  in effect (meaning this test can be used) for the duration of the COVID-19 declaration under Section 564(b)(1) of the Act, 21 U.S.C.section 360bbb-3(b)(1), unless the authorization is terminated  or revoked sooner.       Influenza A by PCR NEGATIVE NEGATIVE Final   Influenza B by PCR NEGATIVE NEGATIVE Final    Comment: (NOTE) The Xpert Xpress SARS-CoV-2/FLU/RSV plus assay is intended as an aid in the diagnosis of influenza from Nasopharyngeal swab specimens and should not be used as a sole basis for treatment. Nasal washings and aspirates are unacceptable for Xpert Xpress SARS-CoV-2/FLU/RSV testing.  Fact Sheet for Patients: EntrepreneurPulse.com.au  Fact Sheet for Healthcare Providers: IncredibleEmployment.be  This test is not yet approved or cleared by the [place] FDA and has been authorized for detection and/or diagnosis of SARS-CoV-2 by FDA under an Emergency Use Authorization (EUA). This EUA will remain in effect (meaning this test can be used) for the duration of the COVID-19 declaration under Section 564(b)(1) of the Act, 21 U.S.C. section  360bbb-3(b)(1), unless the authorization is terminated or revoked.  Performed at [facility], [address] [place], [place] [address]   Culture, Respiratory w Gram Stain     Status: None   Collection Time: 06/28/21  4:00 PM   Specimen: Tracheal Aspirate; Respiratory  Result Value Ref Range Status   Specimen Description   Final    TRACHEAL ASPIRATE Performed at [facility], [address], [place], [place] [address]    Special Requests   Final    NONE Performed at [facility], [address] [place], [place] [address]    Gram Stain   Final    NO SQUAMOUS EPITHELIAL CELLS SEEN ABUNDANT WBC PRESENT,BOTH PMN AND MONONUCLEAR ABUNDANT GRAM POSITIVE COCCI IN CHAINS IN CLUSTERS MODERATE GRAM NEGATIVE RODS RARE GRAM POSITIVE RODS    Culture   Final    MODERATE Normal respiratory flora-no Staph aureus or Pseudomonas seen Performed at [facility], [place] [address] [place], [place] [address]  Report Status 06/30/2021 FINAL  Final    Coagulation Studies: No results for input(s): LABPROT, INR in the last 72 hours.  Urinalysis: No results for input(s): COLORURINE, LABSPEC, PHURINE, GLUCOSEU, HGBUR, BILIRUBINUR, KETONESUR, PROTEINUR, UROBILINOGEN, NITRITE, LEUKOCYTESUR in the last 72 hours.  Invalid input(s): APPERANCEUR    Imaging: DG Chest 2 View  Result Date: 07/02/2021 CLINICAL DATA:  Respiratory failure. EXAM: CHEST - 2 VIEW COMPARISON:  06/28/2021 FINDINGS: 0832 hours. Interval extubation. Low volume film. The cardio pericardial silhouette is enlarged. Diffuse interstitial and parahilar alveolar opacity suggests edema. No substantial pleural effusion. Telemetry leads overlie the chest. IMPRESSION: Low volume film with cardiomegaly and pulmonary edema. Electronically Signed   By: Misty Stanley M.D.   On: 07/02/2021 11:35     Medications:    sodium chloride      sodium chloride   Intravenous Once   calcium acetate  2,001 mg Oral  TID WC   Chlorhexidine Gluconate Cloth  6 each Topical Q0600   cinacalcet  30 mg Oral Q breakfast   epoetin alfa       epoetin (EPOGEN/PROCRIT) injection  10,000 Units Intravenous Q M,W,F-HD   famotidine  20 mg Oral BID   ferrous sulfate  325 mg Oral BID WC   heparin  5,000 Units Subcutaneous Q8H   hydrocortisone   Rectal TID   ipratropium-albuterol  3 mL Nebulization BID   mouth rinse  15 mL Mouth Rinse BID   metoprolol tartrate  50 mg Oral BID     Assessment/ Plan:  Jordan [name] is a 55 y.o. black male with end stage renal disease on hemodialysis, hypertension, COPD, EtOH abuse, atrial fibrillation, congestive heart failure who is admitted to [facility] on End stage renal disease ([place]) [N18.6] Atrial fibrillation with RVR ([place]) [I48.91]   [facility] MWF [facility]. Left AVF 67kg  End Stage Renal Disease: hyperkalemia, pulmonary edema and volume overload on admission.   -Receiving dialysis treatment, tolerating well at this time.  UF goal 2 L as tolerated. - Next treatment scheduled for Friday  Hypotension: with hypertensive urgency with atrial fibrillation with rapid ventricular response on admission. Weaned off norepinephrine - holding home blood pressure agents.  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 - Continue calcium acetate with meals - continue cinacalcet daily  Acute respiratory failure: requiring intubation and mechanical ventilation. Secondary to pulmonary edema. Extubated and stable since dialysis.    Anemia with chronic kidney disease:  status post PRBC transfusion on 10/7.  getting outpatient Mircera as ESA.  - ESA with HD treatment    LOS: 7 [place] 10/12/202212:53 PM

## 2021-07-03 NOTE — Progress Notes (Signed)
Pt with LUA AVF, maintained BFR 400, with continual repositioning. Targeted UF met. No concerns.

## 2021-07-04 ENCOUNTER — Inpatient Hospital Stay: Payer: Medicare Other

## 2021-07-04 DIAGNOSIS — N186 End stage renal disease: Secondary | ICD-10-CM | POA: Diagnosis not present

## 2021-07-04 DIAGNOSIS — I5043 Acute on chronic combined systolic (congestive) and diastolic (congestive) heart failure: Secondary | ICD-10-CM | POA: Diagnosis not present

## 2021-07-04 DIAGNOSIS — R188 Other ascites: Secondary | ICD-10-CM | POA: Diagnosis not present

## 2021-07-04 DIAGNOSIS — I4891 Unspecified atrial fibrillation: Secondary | ICD-10-CM | POA: Diagnosis not present

## 2021-07-04 LAB — BASIC METABOLIC PANEL
Anion gap: 18 — ABNORMAL HIGH (ref 5–15)
BUN: 50 mg/dL — ABNORMAL HIGH (ref 6–20)
CO2: 24 mmol/L (ref 22–32)
Calcium: 7.1 mg/dL — ABNORMAL LOW (ref 8.9–10.3)
Chloride: 94 mmol/L — ABNORMAL LOW (ref 98–111)
Creatinine, Ser: 6.43 mg/dL — ABNORMAL HIGH (ref 0.61–1.24)
GFR, Estimated: 10 mL/min — ABNORMAL LOW (ref >60)
Glucose, Bld: 77 mg/dL (ref 70–99)
Potassium: 3.9 mmol/L (ref 3.5–5.1)
Sodium: 136 mmol/L (ref 135–145)

## 2021-07-04 LAB — GLUCOSE, CAPILLARY
Glucose-Capillary: 94 mg/dL (ref 70–99)
Glucose-Capillary: 91 mg/dL (ref 70–99)
Glucose-Capillary: 32 mg/dL — CL (ref 70–99)
Glucose-Capillary: 89 mg/dL (ref 70–99)

## 2021-07-04 LAB — CBC WITH DIFFERENTIAL/PLATELET
Abs Immature Granulocytes: 0.32 10*3/uL — ABNORMAL HIGH (ref 0.00–0.07)
Basophils Absolute: 0 10*3/uL (ref 0.0–0.1)
Basophils Relative: 0 %
Eosinophils Absolute: 0.1 10*3/uL (ref 0.0–0.5)
Eosinophils Relative: 1 %
HCT: 26.4 % — ABNORMAL LOW (ref 39.0–52.0)
Hemoglobin: 8.3 g/dL — ABNORMAL LOW (ref 13.0–17.0)
Immature Granulocytes: 2 %
Lymphocytes Relative: 4 %
Lymphs Abs: 0.6 10*3/uL — ABNORMAL LOW (ref 0.7–4.0)
MCH: 32.3 pg (ref 26.0–34.0)
MCHC: 31.4 g/dL (ref 30.0–36.0)
MCV: 102.7 fL — ABNORMAL HIGH (ref 80.0–100.0)
Monocytes Absolute: 1 10*3/uL (ref 0.1–1.0)
Monocytes Relative: 7 %
Neutro Abs: 12.4 10*3/uL — ABNORMAL HIGH (ref 1.7–7.7)
Neutrophils Relative %: 86 %
Platelets: 125 10*3/uL — ABNORMAL LOW (ref 150–400)
RBC: 2.57 MIL/uL — ABNORMAL LOW (ref 4.22–5.81)
RDW: 16.6 % — ABNORMAL HIGH (ref 11.5–15.5)
WBC: 14.4 10*3/uL — ABNORMAL HIGH (ref 4.0–10.5)
nRBC: 0.4 % — ABNORMAL HIGH (ref 0.0–0.2)

## 2021-07-04 LAB — BODY FLUID CELL COUNT WITH DIFFERENTIAL
Eos, Fluid: 0 %
Lymphs, Fluid: 13 %
Monocyte-Macrophage-Serous Fluid: 32 %
Neutrophil Count, Fluid: 55 %
Total Nucleated Cell Count, Fluid: 53 cu mm

## 2021-07-04 IMAGING — US US PARACENTESIS
1 series · 13 of 13 positions shown · non-contrast
Comparison: none

INDICATION: Ascites, request for paracentesis.

[Series 1: us paracentesis · 0.26mm/px · 13 of 13 slices shown]
[im 1/13]
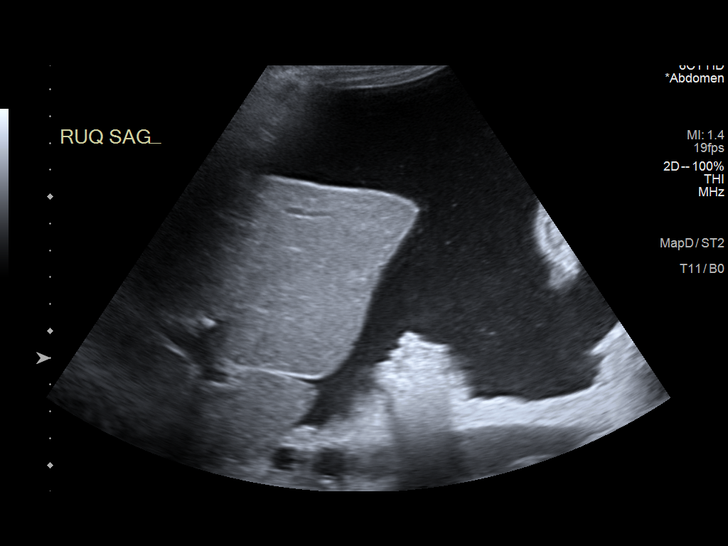
[im 2/13]
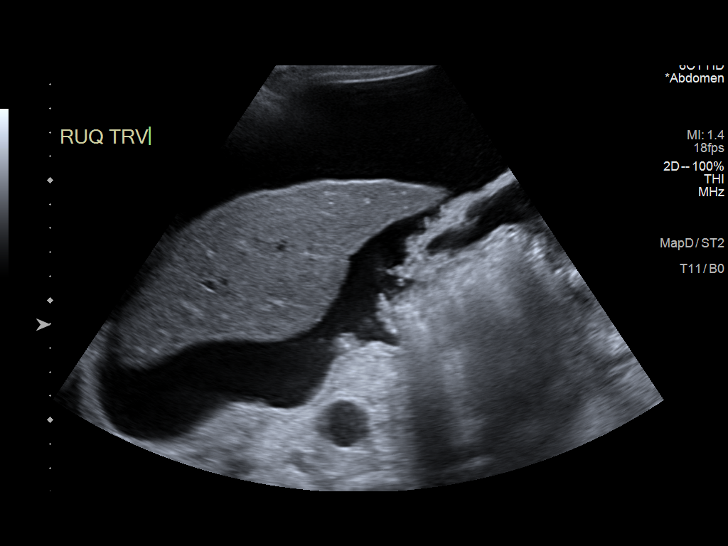
[im 3/13]
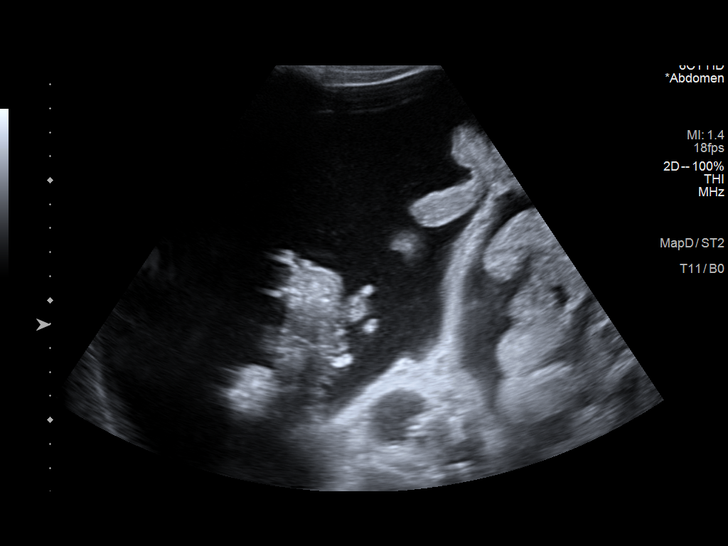
[im 4/13]
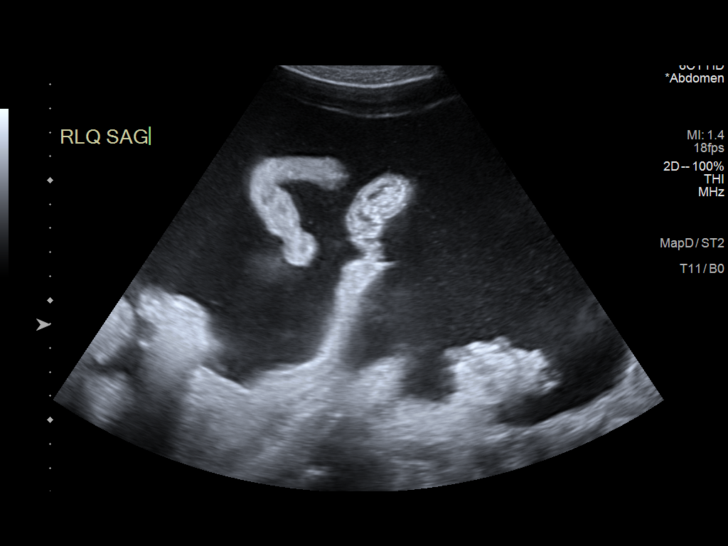
[im 5/13]
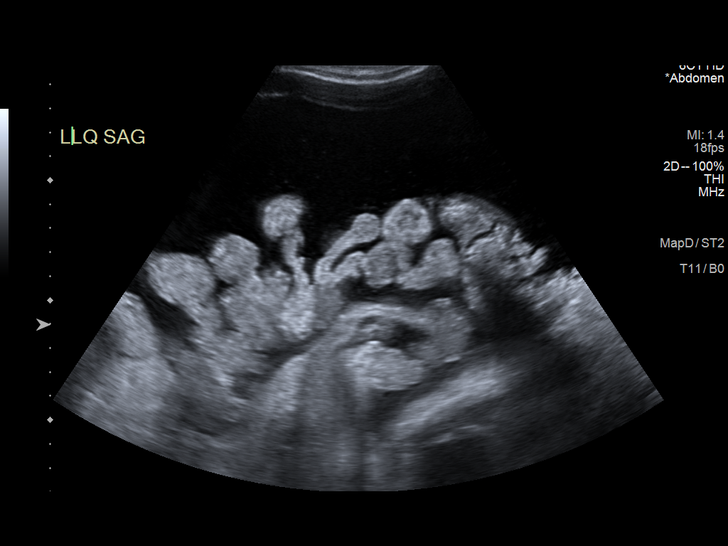
[im 6/13]
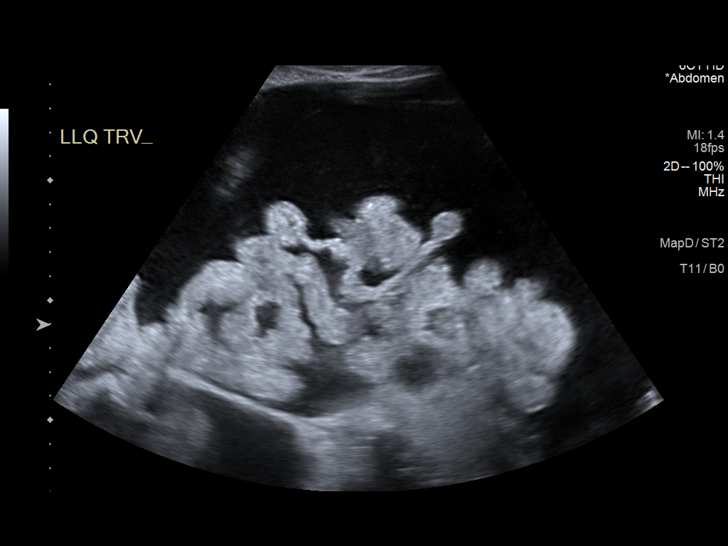
[im 7/13]
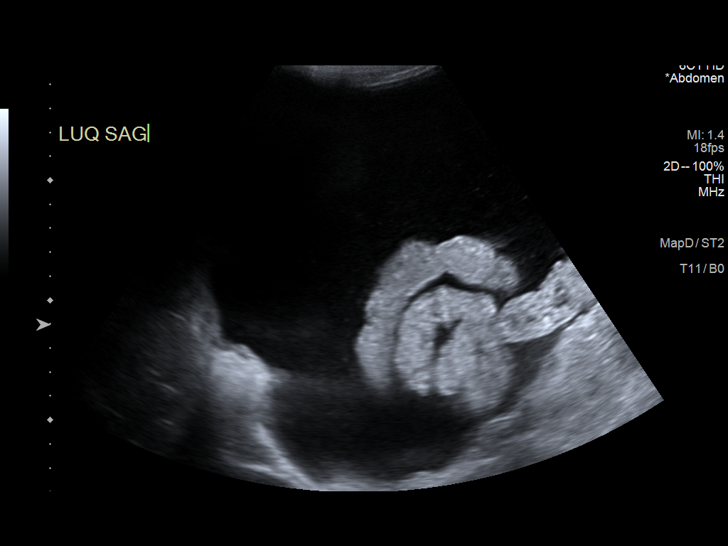
[im 8/13]
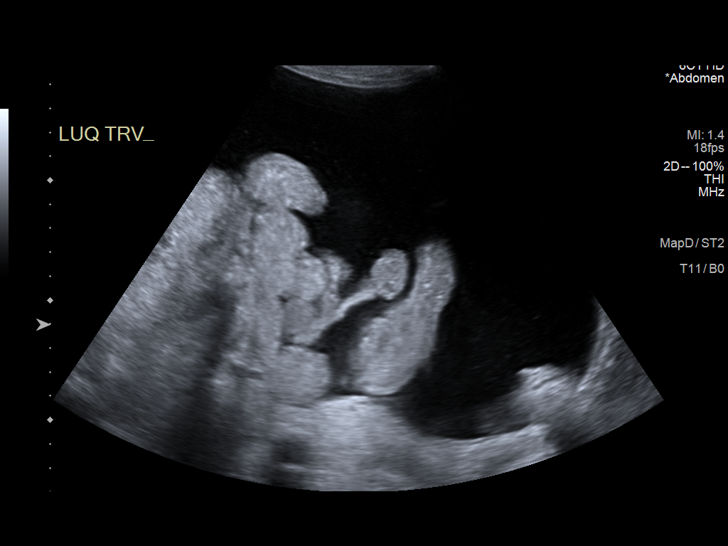
[im 9/13]
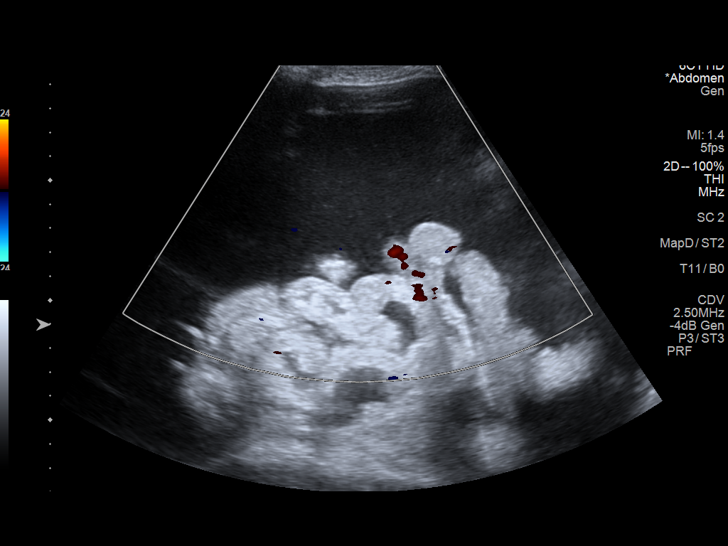
[im 10/13]
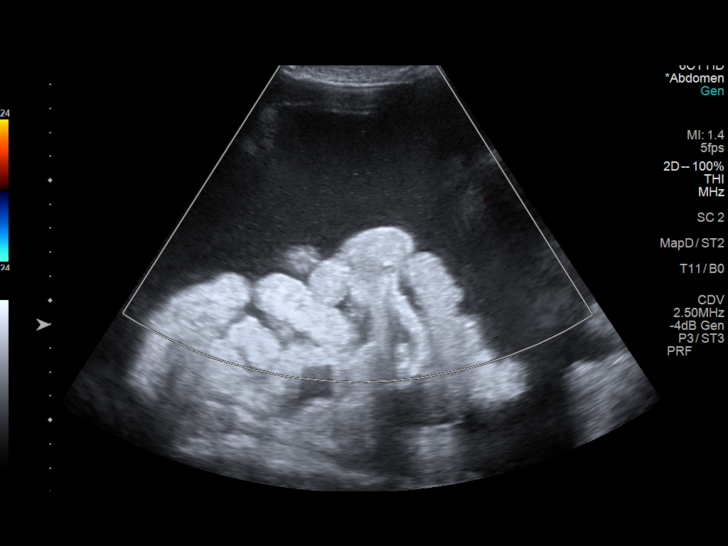
[im 11/13]
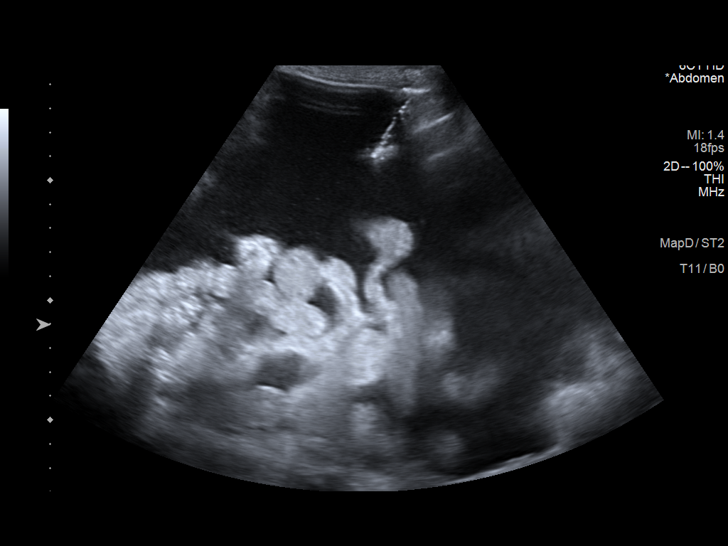
[im 12/13]
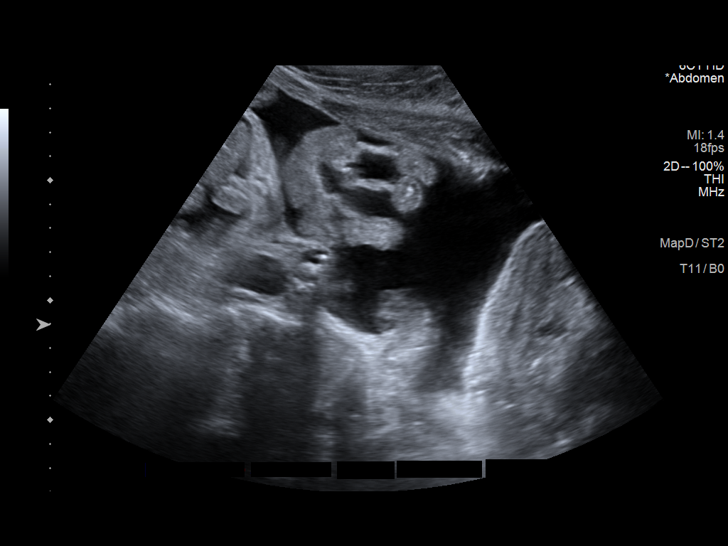
[im 13/13]
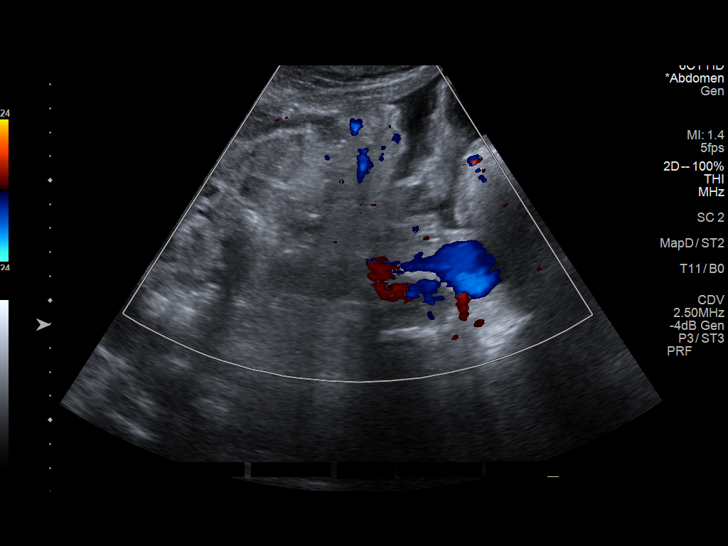

[13 of 13 positions shown; findings below may reference images not displayed]

EXAM:
ULTRASOUND GUIDED  PARACENTESIS

MEDICATIONS:
Lidocaine 1% only.

COMPLICATIONS:
None immediate.

PROCEDURE:
Informed written consent was obtained from the patient after a
discussion of the risks, benefits and alternatives to treatment. A
timeout was performed prior to the initiation of the procedure.

Initial ultrasound scanning demonstrates a large amount of ascites
within the right lower abdominal quadrant. The right lower abdomen
was prepped and draped in the usual sterile fashion. 1% lidocaine
was used for local anesthesia.

Following this, a 6 Fr Safe-T-Centesis catheter was introduced. An
ultrasound image was saved for documentation purposes. The
paracentesis was performed. The catheter was removed and a dressing
was applied. The patient tolerated the procedure well without
immediate post procedural complication.
FINDINGS: A total of approximately 5 L of clear yellow fluid was removed.
Samples were sent to the laboratory as requested by the clinical
team.
IMPRESSION: Successful ultrasound-guided paracentesis yielding 5 liters of
peritoneal fluid.

## 2021-07-04 MED ORDER — IPRATROPIUM-ALBUTEROL 0.5-2.5 (3) MG/3ML IN SOLN: 3.0000 mL | RESPIRATORY_TRACT | Status: DC | PRN

## 2021-07-04 MED ORDER — METOPROLOL TARTRATE 50 MG PO TABS: 50.0000 mg | ORAL_TABLET | Freq: Two times a day (BID) | ORAL | 0 refills | Status: DC

## 2021-07-04 NOTE — TOC Progression Note (Signed)
Transition of Care [facility]) - Progression Note    Patient Details  Name: DANNEY HANNEY MRN: [id number] Date of Birth: 1966/08/13  Transition of Care [facility]) CM/SW [place], RN Phone Number: 07/04/2021, 8:25 PM  Clinical Narrative:    Notified after hours patient was in need of transportation assistance and nobody was answering at [employer]. Attempted to arrange [employer] and unsuccesful  due to no active [employer] account. Attempted to contact [employer] out of [place] and they had no drivers available.   Notified nurse to continue to try and call [employer] to set up transportation.    Expected Discharge Plan: [place] Barriers to Discharge: No Barriers Identified  Expected Discharge Plan and Services Expected Discharge Plan: [place] Choice: [place] arrangements for the past 2 months: Single Family Home Expected Discharge Date: 07/04/21                         HH Arranged: PT, RN [place] Agency: [place] Date [place]: 07/04/21 Time [place]: 1210 Representative spoke with at [place]: [place] Determinants of Health ([place]) Interventions    Readmission Risk Interventions No flowsheet data found.

## 2021-07-04 NOTE — Progress Notes (Signed)
[facility]  ROUNDING NOTE   Subjective:   Mr. CARLTON MACER was admitted to [facility] on 06/26/2021 for End stage renal disease ([place]) [N18.6] Atrial fibrillation with RVR ([place]) [I48.91]  With acute respiratory failure requiring intubated   Patient seen ambulating in the room Alert and oriented Denies nausea and vomiting Denies shortness of breath Denies abdominal pain   Objective:  Vital signs in last 24 hours:  Temp:  [97.7 F (36.5 C)-98.6 F (37 C)] 97.7 F (36.5 C) (10/13 1120) Pulse Rate:  [65-84] 72 (10/13 1120) Resp:  [17-18] 18 (10/13 1120) BP: (104-119)/(68-76) 107/76 (10/13 1120) SpO2:  [88 %-100 %] 96 % (10/13 1120)  Weight change: -1 kg Filed Weights   07/03/21 0452 07/03/21 0752 07/03/21 0823  Weight: 78 kg 78 kg 85.1 kg    Intake/Output: I/O last 3 completed shifts: In: 120 [P.O.:120] Out: 2002 [Other:2002]   Intake/Output this shift:  Total I/O In: 120 [P.O.:120] Out: -   Physical Exam: General: NAD  Head: Moist oral mucus membranes  Eyes: Anicteric  Lungs:  Diminished bilaterally, 4L [place] O2  Heart: Regular rate and rhythm   Abdomen:  Soft, nontender, distended  Extremities:  no peripheral edema.  Neurologic: Alert and oriented  Skin: Multiple abrasions  Access: Left aneurysmal AVF    Basic Metabolic Panel: Recent Labs  Lab 06/27/21 2129 06/28/21 0547 06/28/21 1555 06/29/21 0418 06/30/21 0537 07/01/21 0535 07/01/21 1120 07/02/21 0551 07/04/21 0428  NA 140 137   < > 135 139 139  --  137 136  K 5.1 5.6*   < > 5.1 5.5* 6.3* 4.1 5.4* 3.9  CL 97* 98   < > 95* 97* 98  --  95* 94*  CO2 23 23   < > 25 25 17*  --  19* 24  GLUCOSE 122* 129*   < > 120* 41* 111*  --  81 77  BUN 88* 92*   < > 62* 89* 99*  --  90* 50*  CREATININE 13.08* 12.90*   < > 9.61* 10.53* 11.83*  --  9.68* 6.43*  CALCIUM 8.6* 8.5*   < > 8.7* 8.2* 8.4*  --  8.3* 7.1*  MG 2.2 2.0  --  1.9 2.1  --   --   --   --   PHOS  --   --   --  8.2* 8.9*  --   --   --    --    < > = values in this interval not displayed.     Liver Function Tests: Recent Labs  Lab 06/30/21 0537  ALBUMIN 3.3*    No results for input(s): LIPASE, AMYLASE in the last 168 hours. No results for input(s): AMMONIA in the last 168 hours.  CBC: Recent Labs  Lab 06/29/21 0418 06/30/21 0537 07/01/21 0535 07/03/21 0430 07/04/21 0428  WBC 9.1 18.0* 14.8* 16.4* 14.4*  NEUTROABS 8.4*  --  13.3* 15.0* 12.4*  HGB 8.0* 8.0* 8.9* 8.3* 8.3*  HCT 25.3* 25.8* 26.8* 27.1* 26.4*  MCV 98.4 96.6 94.0 103.8* 102.7*  PLT 137* 130* 155 135* 125*     Cardiac Enzymes: No results for input(s): CKTOTAL, CKMB, CKMBINDEX, TROPONINI in the last 168 hours.  BNP: Invalid input(s): POCBNP  CBG: Recent Labs  Lab 07/03/21 0723 07/03/21 1649 07/03/21 2045 07/04/21 0729 07/04/21 1126  GLUCAP 65* 93 68* 94 91     Microbiology: Results for orders placed or performed during the hospital encounter of 06/26/21  Resp Panel by RT-PCR (Flu A&B, Covid) Nasopharyngeal Swab     Status: None   Collection Time: 06/26/21  8:04 AM   Specimen: Nasopharyngeal Swab; Nasopharyngeal(NP) swabs in vial transport medium  Result Value Ref Range Status   SARS Coronavirus 2 by RT PCR NEGATIVE NEGATIVE Final    Comment: (NOTE) SARS-CoV-2 target nucleic acids are NOT DETECTED.  The SARS-CoV-2 RNA is generally detectable in upper respiratory specimens during the acute phase of infection. The lowest concentration of SARS-CoV-2 viral copies this assay can detect is 138 copies/mL. A negative result does not preclude SARS-Cov-2 infection and should not be used as the sole basis for treatment or other patient management decisions. A negative result may occur with  improper specimen collection/handling, submission of specimen other than nasopharyngeal swab, presence of viral mutation(s) within the areas targeted by this assay, and inadequate number of viral copies(<138 copies/mL). A negative result must be  combined with clinical observations, patient history, and epidemiological information. The expected result is Negative.  Fact Sheet for Patients:  EntrepreneurPulse.com.au  Fact Sheet for Healthcare Providers:  IncredibleEmployment.be  This test is no t yet approved or cleared by the [place] FDA and  has been authorized for detection and/or diagnosis of SARS-CoV-2 by FDA under an Emergency Use Authorization (EUA). This EUA will remain  in effect (meaning this test can be used) for the duration of the COVID-19 declaration under Section 564(b)(1) of the Act, 21 U.S.C.section 360bbb-3(b)(1), unless the authorization is terminated  or revoked sooner.       Influenza A by PCR NEGATIVE NEGATIVE Final   Influenza B by PCR NEGATIVE NEGATIVE Final    Comment: (NOTE) The Xpert Xpress SARS-CoV-2/FLU/RSV plus assay is intended as an aid in the diagnosis of influenza from Nasopharyngeal swab specimens and should not be used as a sole basis for treatment. Nasal washings and aspirates are unacceptable for Xpert Xpress SARS-CoV-2/FLU/RSV testing.  Fact Sheet for Patients: EntrepreneurPulse.com.au  Fact Sheet for Healthcare Providers: IncredibleEmployment.be  This test is not yet approved or cleared by the [place] FDA and has been authorized for detection and/or diagnosis of SARS-CoV-2 by FDA under an Emergency Use Authorization (EUA). This EUA will remain in effect (meaning this test can be used) for the duration of the COVID-19 declaration under Section 564(b)(1) of the Act, 21 U.S.C. section 360bbb-3(b)(1), unless the authorization is terminated or revoked.  Performed at [facility], [address] [place], [place] [address]   Culture, Respiratory w Gram Stain     Status: None   Collection Time: 06/28/21  4:00 PM   Specimen: Tracheal Aspirate; Respiratory  Result Value Ref Range Status    Specimen Description   Final    TRACHEAL ASPIRATE Performed at [facility], [address], [place], [place] [address]    Special Requests   Final    NONE Performed at [facility], [address] [place], [place] [address]    Gram Stain   Final    NO SQUAMOUS EPITHELIAL CELLS SEEN ABUNDANT WBC PRESENT,BOTH PMN AND MONONUCLEAR ABUNDANT GRAM POSITIVE COCCI IN CHAINS IN CLUSTERS MODERATE GRAM NEGATIVE RODS RARE GRAM POSITIVE RODS    Culture   Final    MODERATE Normal respiratory flora-no Staph aureus or Pseudomonas seen Performed at [facility], [place] [address], [place],  [address]    Report Status 06/30/2021 FINAL  Final    Coagulation Studies: No results for input(s): LABPROT, INR in the last 72 hours.  Urinalysis: No results for input(s): COLORURINE, LABSPEC, PHURINE, GLUCOSEU, HGBUR, BILIRUBINUR, KETONESUR, PROTEINUR, UROBILINOGEN, NITRITE, LEUKOCYTESUR in the last 72 hours.  Invalid input(s): APPERANCEUR    Imaging: DG Abd 1 View  Result Date: 07/03/2021 CLINICAL DATA:  Abdominal pain EXAM: ABDOMEN - 1 VIEW COMPARISON:  None. FINDINGS: There is loss of the properitoneal fat flank stripes and central crowding of aerated loops of bowel suggesting underlying ascites. Underpenetration precludes evaluation of the visceral shadows. Nonobstructive bowel gas pattern. Calcifications within the mid abdomen correspond to renovascular calcifications. Advanced vascular calcifications noted within the pelvis. No gross free intraperitoneal gas. IMPRESSION: Suspected at least moderate ascites. Nonobstructive bowel gas pattern. . Electronically Signed   By: Fidela Salisbury M.D.   On: 07/03/2021 22:56     Medications:    sodium chloride      sodium chloride   Intravenous Once   calcium acetate  2,001 mg Oral TID WC   Chlorhexidine Gluconate Cloth  6 each Topical Q0600   cinacalcet  30 mg Oral Q breakfast   epoetin (EPOGEN/PROCRIT) injection  10,000  Units Intravenous Q M,W,F-HD   famotidine  20 mg Oral BID   ferrous sulfate  325 mg Oral BID WC   heparin  5,000 Units Subcutaneous Q8H   hydrocerin   Topical BID   hydrocortisone   Rectal TID   mouth rinse  15 mL Mouth Rinse BID   metoprolol tartrate  50 mg Oral BID     Assessment/ Plan:  Mr. ODARIUS BAUERMEISTER is a 55 y.o. black male with end stage renal disease on hemodialysis, hypertension, COPD, EtOH abuse, atrial fibrillation, congestive heart failure who is admitted to [facility] on End stage renal disease ([place]) [N18.6] Atrial fibrillation with RVR ([place]) [I48.91]   [facility] MWF [facility]. Left AVF 67kg  End Stage Renal Disease: hyperkalemia, pulmonary edema and volume overload on admission.   - Stable potassium - Next treatment scheduled for Friday  Hypotension: with hypertensive urgency with atrial fibrillation with rapid ventricular response on admission. Weaned off norepinephrine - holding home blood pressure agents.  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 - Continue calcium acetate with meals - continue cinacalcet daily  Acute respiratory failure: requiring intubation and mechanical ventilation. Secondary to pulmonary edema. Extubated and stable since dialysis.    Anemia with chronic kidney disease:  status post PRBC transfusion on 10/7.  getting outpatient Mircera as ESA.  - ESA with HD treatment    LOS: 8 [place] 10/13/20222:36 PM

## 2021-07-04 NOTE — Procedures (Signed)
PROCEDURE SUMMARY:  Successful US guided paracentesis from RLQ.  Yielded 5 L of clear yellow fluid.  No immediate complications.  Pt tolerated well.   Specimen was sent for labs.  EBL < 21m  MRockney Ghee10/13/2022 4:31 PM

## 2021-07-04 NOTE — TOC Initial Note (Addendum)
Transition of Care [facility]) - Initial/Assessment Note    Patient Details  Name: Jordan [name] MRN: [id number] Date of Birth: 07-31-1966  Transition of Care [facility]) CM/SW Contact:    Alberteen Sam, LCSW Phone Number: 07/04/2021, 12:11 PM  Clinical Narrative:                  Patient agreeable to home health services, sent referral to [place] with [facility] accepted patient for PT and RN.   Patient reports PCP is Webb Silversmith, NP at [facility].   No other discharge needs identified at this time.   Expected Discharge Plan: [place] Barriers to Discharge: No Barriers Identified   Patient Goals and CMS Choice Patient states their goals for this hospitalization and ongoing recovery are:: to go home CMS Medicare.gov Compare Post Acute Care list provided to:: Patient Choice offered to / list presented to : Patient  Expected Discharge Plan and Services Expected Discharge Plan: [place] Acute Care Choice: [place] arrangements for the past 2 months: Single Family Home                           HH Arranged: PT, RN [place] Agency: [place] Date [facility] Agency Contacted: 07/04/21 Time HH Agency Contacted: 1210 Representative spoke with at [place]: [place]  Prior Living Arrangements/Services Living arrangements for the past 2 months: [place] with:: Self   Do you feel safe going back to the place where you live?: Yes               Activities of Daily Living Home Assistive Devices/Equipment: None ADL Screening (condition at time of admission) Patient's cognitive ability adequate to safely complete daily activities?: Yes Is the patient deaf or have difficulty hearing?: No Does the patient have difficulty seeing, even when wearing glasses/contacts?: No Does the patient have difficulty concentrating, remembering, or making decisions?: Yes Patient able to express need  for assistance with ADLs?: Yes Does the patient have difficulty dressing or bathing?: No Independently performs ADLs?: Yes (appropriate for developmental age) Does the patient have difficulty walking or climbing stairs?: Yes Weakness of Legs: Both Weakness of Arms/Hands: None  Permission Sought/Granted                  Emotional Assessment Appearance:: Appears stated age Attitude/Demeanor/Rapport: Gracious Affect (typically observed): Calm Orientation: : Oriented to Self, Oriented to Place, Oriented to  Time, Oriented to Situation Alcohol / Substance Use: Not Applicable Psych Involvement: No (comment)  Admission diagnosis:  End stage renal disease ([place]) [N18.6] Atrial fibrillation with RVR ([place]) [I48.91] Patient Active Problem List   Diagnosis Date Noted   Protein-calorie malnutrition, severe 06/05/2021   Hypervolemia associated with renal insufficiency 06/02/2021   Symptomatic anemia 05/28/2021   Acute on chronic diastolic CHF (congestive heart failure) ([place]) 05/28/2021   Hyperkalemia 05/28/2021   Pulmonary edema 04/29/2021   Pruritus 04/29/2021   COVID-19 virus infection 04/29/2021   Thrombocytopenia ([place]) 04/08/2021   COPD (chronic obstructive pulmonary disease) ([place]) 04/08/2021   CHF (congestive heart failure) ([place]) 03/25/2021   PAF (paroxysmal atrial fibrillation) ([place]) 03/20/2021   Chronic diastolic CHF (congestive heart failure) ([place]) 03/20/2021   Alcohol abuse 03/20/2021   Vision loss of left eye 03/20/2021   SOB (shortness of breath) 03/20/2021   Shortness of breath 03/10/2021   Abscess of buttock 03/02/2021  Fluid overload 03/02/2021   Multifocal pneumonia 01/13/2021   Current mild episode of major depressive disorder without prior episode ([place]) 04/09/2020   Atrial fibrillation with RVR ([place]) 04/04/2020   Non-compliance with renal dialysis ([place]) 03/21/2020   Acute CHF (congestive heart failure) ([place]) 03/12/2020   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 ([place]) 02/03/2020   Acute on chronic combined systolic and diastolic CHF (congestive heart failure) ([place]) 02/03/2020   Acute on chronic respiratory failure with hypoxia ([place]) 11/12/2019   Aortic atherosclerosis ([place]) 11/12/2019   Emphysema of lung ([place]) 11/12/2019   Volume overload 10/05/2019   Elevated troponin 05/17/2018   Anemia in ESRD (end-stage renal disease) ([place]) 05/17/2018   ESRD on dialysis ([place]) 05/26/2017   Essential hypertension 05/26/2017   Moderate protein-calorie malnutrition ([place]) 08/13/2016   Secondary hyperparathyroidism of renal origin ([place]) 08/13/2016   Tobacco abuse 08/04/2016   PCP:  Merryl Hacker, No Pharmacy:   [facility], [place] - [address] [place] [address] Phone: [phone] Fax: [phone]  [facility] [place], [place] - [address] [employer], [address] [place] [address] Phone: [phone] Fax: [phone]     Social Determinants of Health (SDOH) Interventions    Readmission Risk Interventions No flowsheet data found.

## 2021-07-04 NOTE — Progress Notes (Signed)
Cross cover Patient complained of significant abdominal pain early last evening.  Abdomen ws distended and sllightly firm but not more so than day prior.  Did habe bowel movement in lst 24 hours. KUB shows significant ascites.  May need paracentesis if discomfort from ascites fluid worsens

## 2021-07-04 NOTE — Discharge Summary (Signed)
Physician Discharge Summary  Jordan [name] [id number] DOB: 1965-11-07 DOA: 06/26/2021  PCP: Pcp, No  Admit date: 06/26/2021 Discharge date: 07/04/2021  Discharge disposition: Home   Recommendations for Outpatient Follow-Up:   Follow up with PCP in 1 week. Continue outpatient hemodialysis on Mondays, Wednesdays and Fridays   Discharge Diagnosis:   Active Problems:   ESRD on dialysis [facility])   Essential hypertension   Elevated troponin   Anemia in ESRD (end-stage renal disease) ([place])   Acute on chronic respiratory failure with hypoxia (HCC)   Acute on chronic combined systolic and diastolic CHF (congestive heart failure) (HCC)   Atrial fibrillation with RVR (HCC)   Thrombocytopenia (HCC)   COPD (chronic obstructive pulmonary disease) (HCC)   Hyperkalemia    Discharge Condition: Stable.  Diet recommendation:  Diet Order             Diet renal 60/70-10-24-1198           Diet renal/carb modified with fluid restriction Diet-HS Snack? Nothing; Fluid restriction: 1200 mL Fluid; Room service appropriate? Yes; Fluid consistency: Thin  Diet effective now                     Code Status: Partial Code     Hospital Course:   [name] is a 55 y.o. male with medical history significant for ESRD, hypertension, COPD, chronic systolic and diastolic CHF, GI bleeding, internal hemorrhoids, who presented to the hospital because of shortness of breath, chest discomfort, leg swelling and abdominal distention after missing multiple dialysis sessions.  He said he had missed dialysis because he had been incarcerated for a week.  He was admitted to the hospital for acute on chronic diastolic CHF, ESRD with fluid overload, atrial fibrillation with rapid ventricular response and acute on chronic hypoxemic respiratory failure.  He was treated with IV Lasix, IV Cardizem drip and supplemental oxygen.  He was seen by the nephrologist and he underwent hemodialysis for fluid  overload.  During hospitalization, patient was noted to have significant neck swelling and oral/tongue swelling.  Anaphylactic reaction was suspected so he was treated with IM epinephrine, IV Benadryl and IV steroids steroids.  He was intubated and placed on mechanical ventilation and also required Levophed for hypotension. Swelling improved after hemodialysis.  Orofacial swelling was later attributed to fluid overload rather than anaphylactic reaction.  He was also found to have ascites.  Paracentesis yielded 5 L of ascitic fluid.  He was evaluated with a gastroenterologist for rectal bleeding.  This was thought to be from hemorrhoids.  He was treated with steroid suppositories.  His condition has improved and he is deemed stable for discharge to home.    Medical Consultants:   Cardiologist   Discharge Exam:    Vitals:   07/04/21 1120 07/04/21 1440 07/04/21 1545 07/04/21 1622  BP: 107/76 109/69 111/79 107/74  Pulse: 72 67 73 65  Resp: 18 18  (!) 22  Temp: 97.7 F (36.5 C) (!) 97.4 F (36.3 C)    TempSrc: Oral Oral    SpO2: 96% 91% (!) 84% 96%  Weight:      Height:         GEN: NAD SKIN: Warm and dry EYES: EOMI ENT: MMM CV: RRR PULM: CTA B ABD: soft, distended (prior to paracentesis), NT, +BS CNS: AAO x 3, non focal EXT: No edema or tenderness   The results of significant diagnostics from this hospitalization (including imaging, microbiology, ancillary and laboratory) are  listed below for reference.     Procedures and Diagnostic Studies:   DG Chest 1 View  Result Date: 06/27/2021 CLINICAL DATA:  Anaphylactic reaction EXAM: CHEST  1 VIEW COMPARISON:  06/26/2021 FINDINGS: Cardiac size is moderately enlarged, similar to prior examination and similar to remote prior examination of 04/26/2021. A component of this was related to an underlying pericardial effusion better appreciated on CT examination of 05/03/2021. Superimposed mild to moderate diffuse interstitial  pulmonary edema has progressed slightly in the interval since prior examination. No pneumothorax or pleural effusion. Pulmonary insufflation is stable, the lung volumes are small. No acute bone abnormality. IMPRESSION: Stable cardiomegaly, a component which likely relates to an underlying pericardial effusion as better seen on CT examination of 05/03/2021. Progressive mild to moderate pulmonary edema. Pulmonary hypoinflation. Electronically Signed   By: Fidela Salisbury M.D.   On: 06/27/2021 21:29   DG Chest Portable 1 View  Result Date: 06/26/2021 CLINICAL DATA:  Short of breath EXAM: PORTABLE CHEST 1 VIEW COMPARISON:  None. FINDINGS: Stable enlarged cardiac silhouette. There is diffuse mild airspace disease. No focal consolidation. Pleural fluid or pneumothorax. IMPRESSION: Cardiomegaly and pulmonary edema pattern. Potential component of pericardial effusion. Electronically Signed   By: Suzy Bouchard M.D.   On: 06/26/2021 08:35     Labs:   Basic Metabolic Panel: Recent Labs  Lab 06/27/21 2129 06/28/21 0547 06/28/21 1555 06/29/21 0418 06/30/21 0537 07/01/21 0535 07/01/21 1120 07/02/21 0551 07/04/21 0428  NA 140 137   < > 135 139 139  --  137 136  K 5.1 5.6*   < > 5.1 5.5* 6.3*   < > 5.4* 3.9  CL 97* 98   < > 95* 97* 98  --  95* 94*  CO2 23 23   < > 25 25 17*  --  19* 24  GLUCOSE 122* 129*   < > 120* 41* 111*  --  81 77  BUN 88* 92*   < > 62* 89* 99*  --  90* 50*  CREATININE 13.08* 12.90*   < > 9.61* 10.53* 11.83*  --  9.68* 6.43*  CALCIUM 8.6* 8.5*   < > 8.7* 8.2* 8.4*  --  8.3* 7.1*  MG 2.2 2.0  --  1.9 2.1  --   --   --   --   PHOS  --   --   --  8.2* 8.9*  --   --   --   --    < > = values in this interval not displayed.   GFR Estimated Creatinine Clearance: 13.1 mL/min (A) (by C-G formula based on SCr of 6.43 mg/dL (H)). Liver Function Tests: Recent Labs  Lab 06/30/21 0537  ALBUMIN 3.3*   No results for input(s): LIPASE, AMYLASE in the last 168 hours. No results for  input(s): AMMONIA in the last 168 hours. Coagulation profile No results for input(s): INR, PROTIME in the last 168 hours.  CBC: Recent Labs  Lab 06/29/21 0418 06/30/21 0537 07/01/21 0535 07/03/21 0430 07/04/21 0428  WBC 9.1 18.0* 14.8* 16.4* 14.4*  NEUTROABS 8.4*  --  13.3* 15.0* 12.4*  HGB 8.0* 8.0* 8.9* 8.3* 8.3*  HCT 25.3* 25.8* 26.8* 27.1* 26.4*  MCV 98.4 96.6 94.0 103.8* 102.7*  PLT 137* 130* 155 135* 125*   Cardiac Enzymes: No results for input(s): CKTOTAL, CKMB, CKMBINDEX, TROPONINI in the last 168 hours. BNP: Invalid input(s): POCBNP CBG: Recent Labs  Lab 07/03/21 0723 07/03/21 1649 07/03/21 2045 07/04/21 0729 07/04/21 1126  GLUCAP 65* 93 68* 94 91   D-Dimer No results for input(s): DDIMER in the last 72 hours. Hgb A1c No results for input(s): HGBA1C in the last 72 hours. Lipid Profile No results for input(s): CHOL, HDL, LDLCALC, TRIG, CHOLHDL, LDLDIRECT in the last 72 hours. Thyroid function studies No results for input(s): TSH, T4TOTAL, T3FREE, THYROIDAB in the last 72 hours.  Invalid input(s): FREET3 Anemia work up No results for input(s): VITAMINB12, FOLATE, FERRITIN, TIBC, IRON, RETICCTPCT in the last 72 hours. Microbiology Recent Results (from the past 240 hour(s))  Resp Panel by RT-PCR (Flu A&B, Covid) Nasopharyngeal Swab     Status: None   Collection Time: 06/26/21  8:04 AM   Specimen: Nasopharyngeal Swab; Nasopharyngeal(NP) swabs in vial transport medium  Result Value Ref Range Status   SARS Coronavirus 2 by RT PCR NEGATIVE NEGATIVE Final    Comment: (NOTE) SARS-CoV-2 target nucleic acids are NOT DETECTED.  The SARS-CoV-2 RNA is generally detectable in upper respiratory specimens during the acute phase of infection. The lowest concentration of SARS-CoV-2 viral copies this assay can detect is 138 copies/mL. A negative result does not preclude SARS-Cov-2 infection and should not be used as the sole basis for treatment or other patient  management decisions. A negative result may occur with  improper specimen collection/handling, submission of specimen other than nasopharyngeal swab, presence of viral mutation(s) within the areas targeted by this assay, and inadequate number of viral copies(<138 copies/mL). A negative result must be combined with clinical observations, patient history, and epidemiological information. The expected result is Negative.  Fact Sheet for Patients:  EntrepreneurPulse.com.au  Fact Sheet for Healthcare Providers:  IncredibleEmployment.be  This test is no t yet approved or cleared by the [place] FDA and  has been authorized for detection and/or diagnosis of SARS-CoV-2 by FDA under an Emergency Use Authorization (EUA). This EUA will remain  in effect (meaning this test can be used) for the duration of the COVID-19 declaration under Section 564(b)(1) of the Act, 21 U.S.C.section 360bbb-3(b)(1), unless the authorization is terminated  or revoked sooner.       Influenza A by PCR NEGATIVE NEGATIVE Final   Influenza B by PCR NEGATIVE NEGATIVE Final    Comment: (NOTE) The Xpert Xpress SARS-CoV-2/FLU/RSV plus assay is intended as an aid in the diagnosis of influenza from Nasopharyngeal swab specimens and should not be used as a sole basis for treatment. Nasal washings and aspirates are unacceptable for Xpert Xpress SARS-CoV-2/FLU/RSV testing.  Fact Sheet for Patients: EntrepreneurPulse.com.au  Fact Sheet for Healthcare Providers: IncredibleEmployment.be  This test is not yet approved or cleared by the [place] FDA and has been authorized for detection and/or diagnosis of SARS-CoV-2 by FDA under an Emergency Use Authorization (EUA). This EUA will remain in effect (meaning this test can be used) for the duration of the COVID-19 declaration under Section 564(b)(1) of the Act, 21 U.S.C. section 360bbb-3(b)(1),  unless the authorization is terminated or revoked.  Performed at [facility], [address] [place], [place] [address]   Culture, Respiratory w Gram Stain     Status: None   Collection Time: 06/28/21  4:00 PM   Specimen: Tracheal Aspirate; Respiratory  Result Value Ref Range Status   Specimen Description   Final    TRACHEAL ASPIRATE Performed at [facility], [address], [place], [place] [address]    Special Requests   Final    NONE Performed at [facility], [address] [place], [place] [address]  Gram Stain   Final    NO SQUAMOUS EPITHELIAL CELLS SEEN ABUNDANT WBC PRESENT,BOTH PMN AND MONONUCLEAR ABUNDANT GRAM POSITIVE COCCI IN CHAINS IN CLUSTERS MODERATE GRAM NEGATIVE RODS RARE GRAM POSITIVE RODS    Culture   Final    MODERATE Normal respiratory flora-no Staph aureus or Pseudomonas seen Performed at [facility], [address] [place], [place] [address]    Report Status 06/30/2021 FINAL  Final     Discharge Instructions:   Discharge Instructions     Diet renal 60/70-10-24-1198   Complete by: As directed    Discharge instructions   Complete by: As directed    Avoid alcohol, tobacco or illicit drugs. Go to the outpatient hemodialysis center on Mondays, Wednesdays and Fridays for hemodialysis.   Increase activity slowly   Complete by: As directed       Allergies as of 07/04/2021       Reactions   Lisinopril Swelling        Medication List     STOP taking these medications    hydrALAZINE 50 MG tablet Commonly known as: APRESOLINE       TAKE these medications    albuterol 108 (90 Base) MCG/ACT inhaler Commonly known as: VENTOLIN HFA Inhale 2 puffs into the lungs every 4 (four) hours as needed for wheezing or shortness of breath.   calcium acetate 667 MG capsule Commonly known as: PHOSLO Take 2 capsules (1,334 mg total) by mouth 3 (three) times daily with meals.   cinacalcet 30 MG  tablet Commonly known as: SENSIPAR Take 1 tablet (30 mg total) by mouth daily with breakfast.   docusate sodium 100 MG capsule Commonly known as: COLACE Take 1 capsule (100 mg total) by mouth 2 (two) times daily.   epoetin alfa 4000 UNIT/ML injection Commonly known as: EPOGEN Inject 1 mL (4,000 Units total) into the vein every Monday, Wednesday, and Friday with hemodialysis.   ferrous sulfate 325 (65 FE) MG tablet Take 1 tablet (325 mg total) by mouth 2 (two) times daily with a meal.   metoprolol tartrate 50 MG tablet Commonly known as: LOPRESSOR Take 1 tablet (50 mg total) by mouth 2 (two) times daily.   pantoprazole 20 MG tablet Commonly known as: Protonix Take 1 tablet (20 mg total) by mouth 2 (two) times daily before a meal.           If you experience worsening of your admission symptoms, develop shortness of breath, life threatening emergency, suicidal or homicidal thoughts you must seek medical attention immediately by calling 911 or calling your MD immediately  if symptoms less severe.   You must read complete instructions/literature along with all the possible adverse reactions/side effects for all the medicines you take and that have been prescribed to you. Take any new medicines after you have completely understood and accept all the possible adverse reactions/side effects.    Please note   You were cared for by a hospitalist during your hospital stay. If you have any questions about your discharge medications or the care you received while you were in the hospital after you are discharged, you can call the unit and asked to speak with the hospitalist on call if the hospitalist that took care of you is not available. Once you are discharged, your primary care physician will handle any further medical issues. Please note that NO REFILLS for any discharge medications will be authorized once you are discharged, as it is imperative that you return to  your primary care  physician (or establish a relationship with a primary care physician if you do not have one) for your aftercare needs so that they can reassess your need for medications and monitor your lab values.       Time coordinating discharge: 33 minutes  Signed:  Silas Sedam  [facility] 07/04/2021, 5:05 PM   Pager on [email or web address]. If 7PM-7AM, please contact night-coverage at [email or web address]

## 2021-07-04 NOTE — Progress Notes (Signed)
The patient states that he has not been successful in contacting family members to assist with d/c transport needs. This Probation officer has been in communication with the case Freight forwarder, Camera operator, and nursing supervisor in regards to not being able to contact [employer]. Informed Sharion Settler, NP  about the unsuccessful attempts for d/c transportation.

## 2021-07-05 ENCOUNTER — Inpatient Hospital Stay: Payer: Medicare Other

## 2021-07-05 DIAGNOSIS — I4891 Unspecified atrial fibrillation: Secondary | ICD-10-CM | POA: Diagnosis not present

## 2021-07-05 DIAGNOSIS — R188 Other ascites: Secondary | ICD-10-CM

## 2021-07-05 DIAGNOSIS — N186 End stage renal disease: Secondary | ICD-10-CM | POA: Diagnosis not present

## 2021-07-05 DIAGNOSIS — I5043 Acute on chronic combined systolic (congestive) and diastolic (congestive) heart failure: Secondary | ICD-10-CM | POA: Diagnosis not present

## 2021-07-05 DIAGNOSIS — Z992 Dependence on renal dialysis: Secondary | ICD-10-CM | POA: Diagnosis not present

## 2021-07-05 LAB — ALLERGENS(10) FOODS
Allergen Corn, IgE: 2.35 kU/L — AB
F045-IgE Yeast: 0.47 kU/L — AB
F081-IgE Cheese, Cheddar Type: <0.1 kU/L
F082-IgE Cheese, Mold Type: 0.25 kU/L — AB
F245-Ige Egg, Whole: <0.1 kU/L
Malt: 5.04 kU/L — AB
Milk IgE: 1.1 kU/L — AB
Peanut IgE: 6.25 kU/L — AB
Soybean IgE: 3.41 kU/L — AB
Wheat IgE: 3.74 kU/L — AB

## 2021-07-05 LAB — GLUCOSE, CAPILLARY
Glucose-Capillary: 111 mg/dL — ABNORMAL HIGH (ref 70–99)
Glucose-Capillary: 94 mg/dL (ref 70–99)

## 2021-07-05 IMAGING — US US EXTREM LOW VENOUS
1 series · 13 of 24 positions shown · non-contrast
Comparison: None.

CLINICAL DATA: 54-year-old male with a history bilateral leg pain



[Series 1: us venous img lower bilat (dvt) · portal-venous · 13 of 58 slices shown]
[im 1/58]
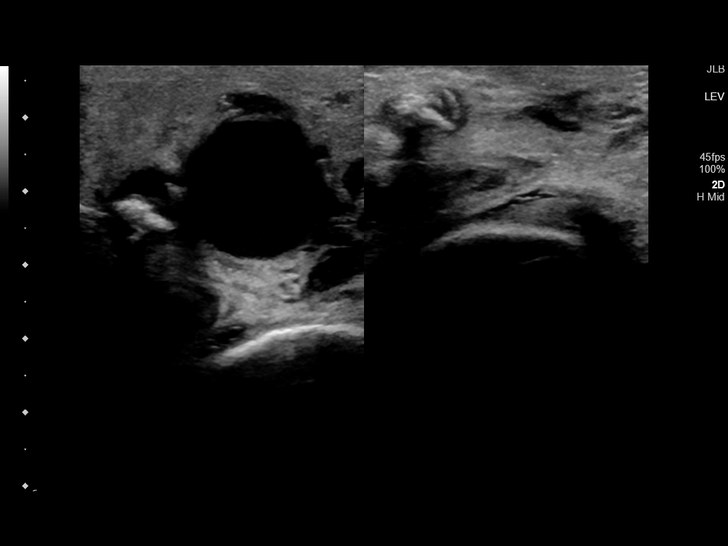
[im 5/58]
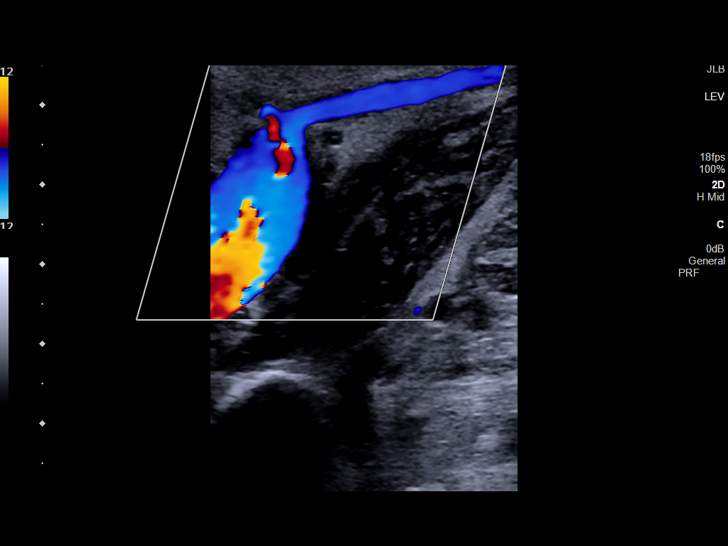
[im 10/58]
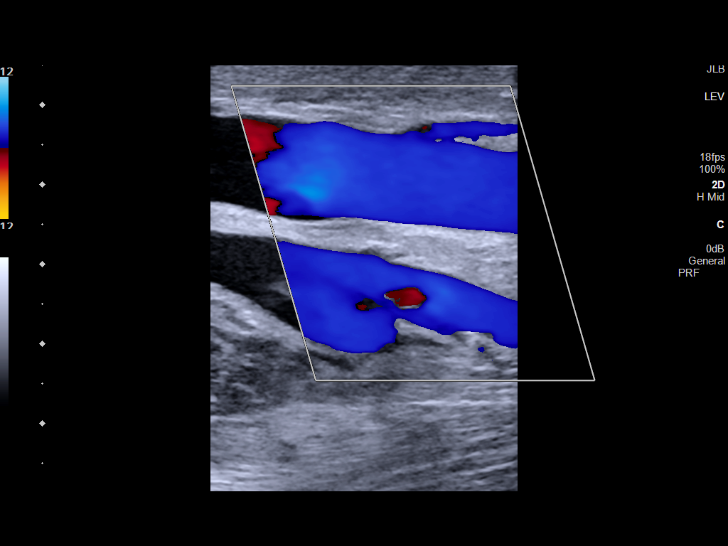
[im 15/58]
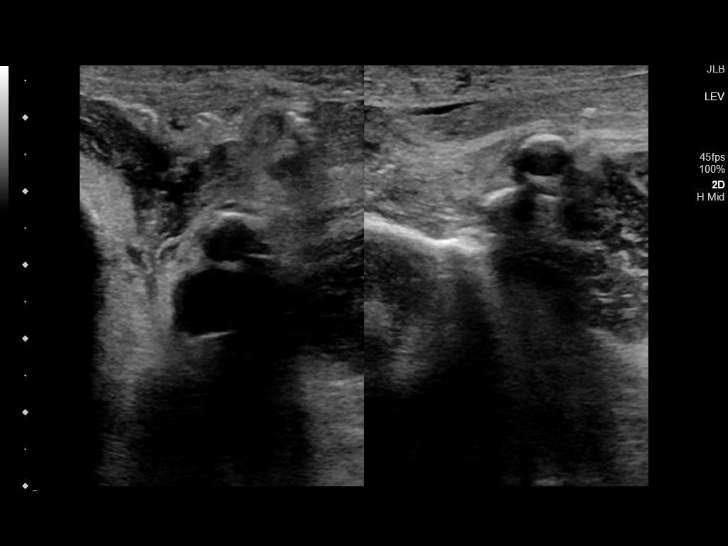
[im 20/58]
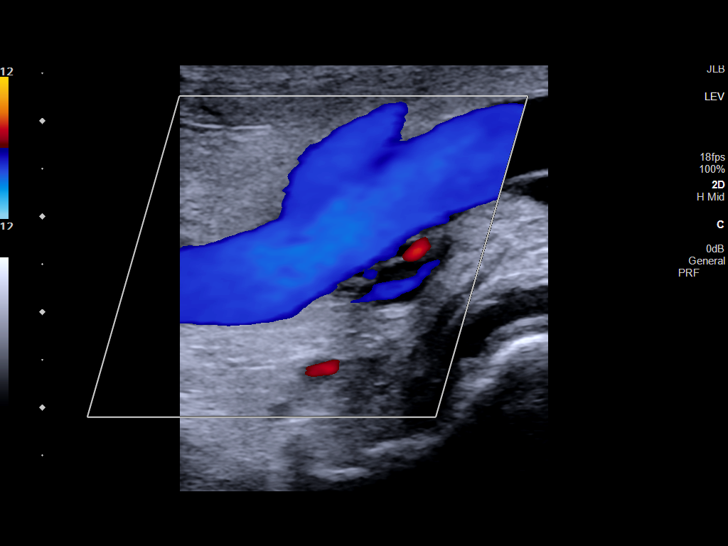
[im 25/58]
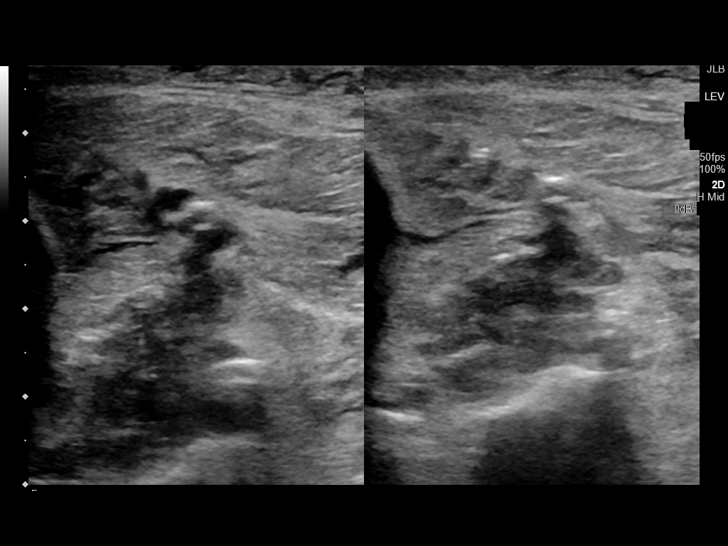
[im 30/58]
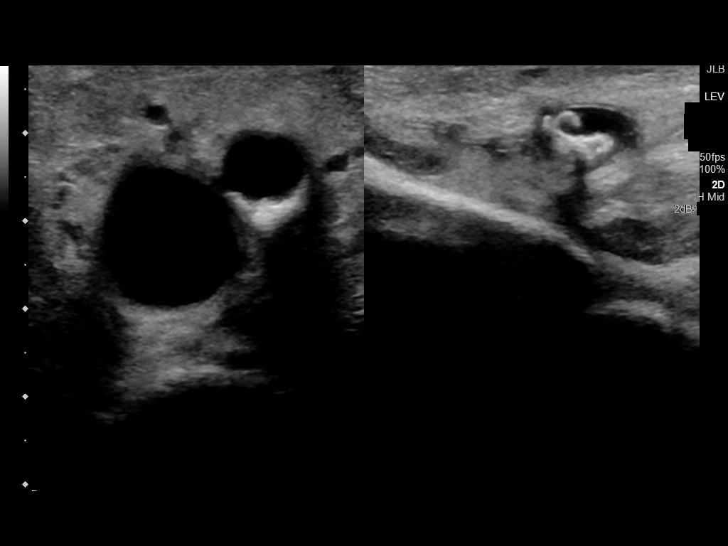
[im 33/58]
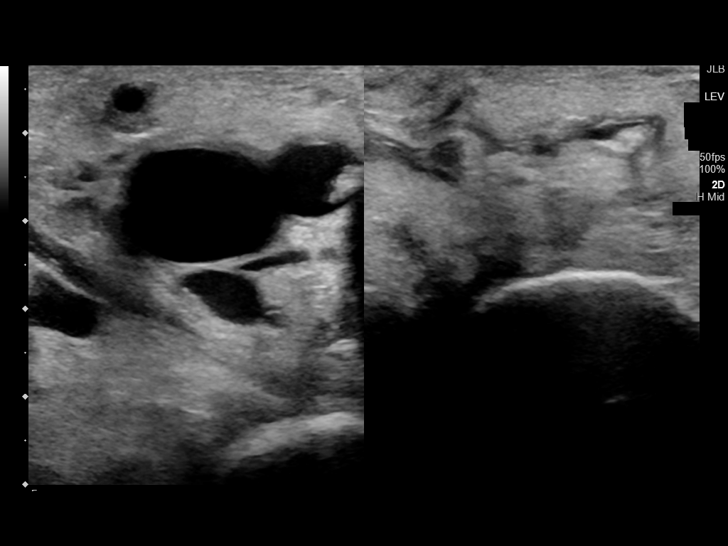
[im 38/58]
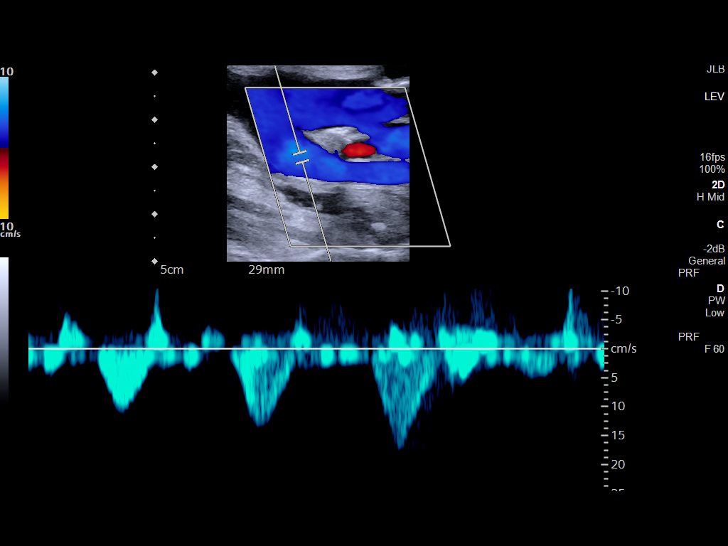
[im 43/58]
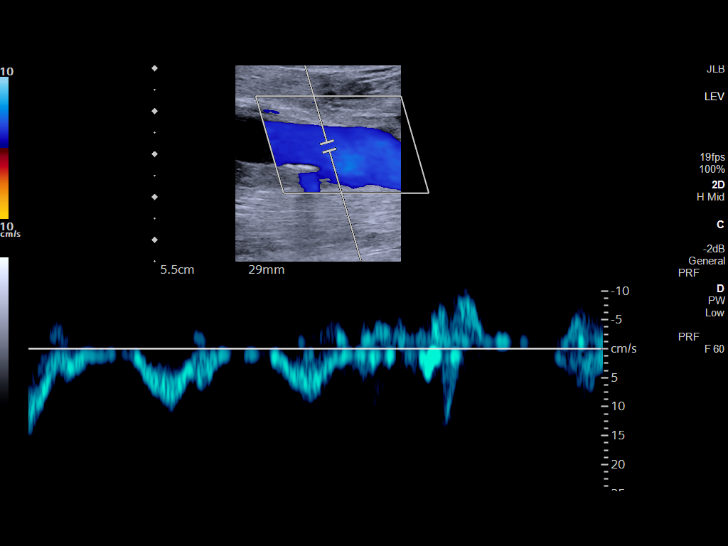
[im 48/58]
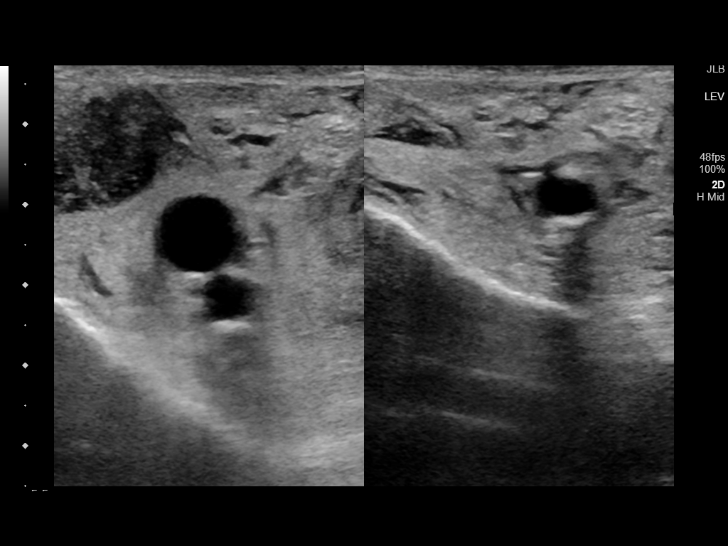
[im 53/58]
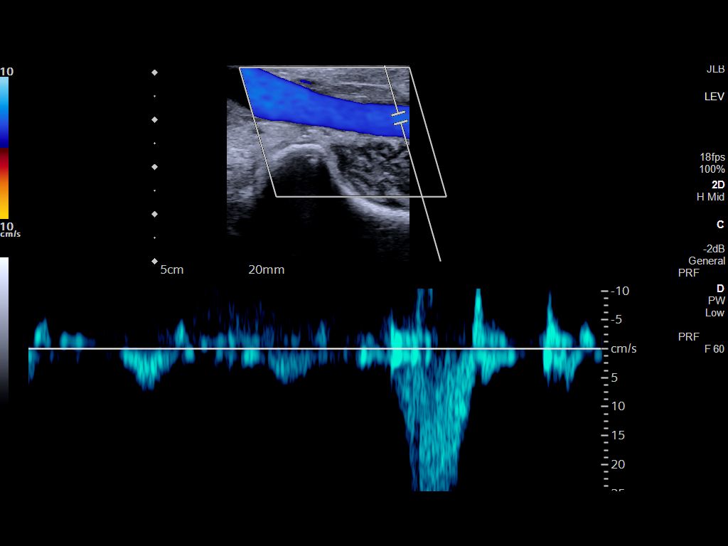
[im 58/58]
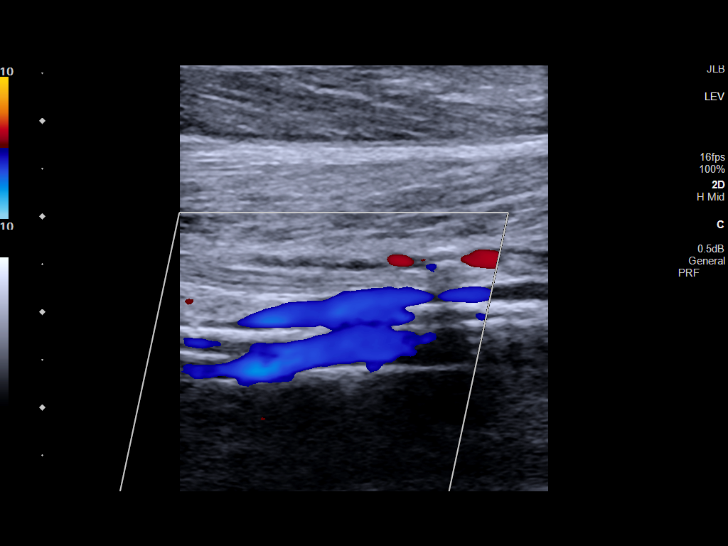

[13 of 24 positions shown; findings below may reference images not displayed]

FINDINGS: RIGHT LOWER EXTREMITY

Common Femoral Vein: No evidence of thrombus. Normal
compressibility, respiratory phasicity and response to augmentation.

Saphenofemoral Junction: No evidence of thrombus. Normal
compressibility and flow on color Doppler imaging.

Profunda Femoral Vein: No evidence of thrombus. Normal
compressibility and flow on color Doppler imaging.

Femoral Vein: No evidence of thrombus. Normal compressibility,
respiratory phasicity and response to augmentation.

Popliteal Vein: No evidence of thrombus. Normal compressibility,
respiratory phasicity and response to augmentation.

Calf Veins: No evidence of thrombus. Normal compressibility and flow
on color Doppler imaging.

Superficial Great Saphenous Vein: No evidence of thrombus. Normal
compressibility and flow on color Doppler imaging.

Other Findings:  None.

LEFT LOWER EXTREMITY

Common Femoral Vein: No evidence of thrombus. Normal
compressibility, respiratory phasicity and response to augmentation.

Saphenofemoral Junction: No evidence of thrombus. Normal
compressibility and flow on color Doppler imaging.

Profunda Femoral Vein: No evidence of thrombus. Normal
compressibility and flow on color Doppler imaging.

Femoral Vein: No evidence of thrombus. Normal compressibility,
respiratory phasicity and response to augmentation.

Popliteal Vein: No evidence of thrombus. Normal compressibility,
respiratory phasicity and response to augmentation.

Calf Veins: No evidence of thrombus. Normal compressibility and flow
on color Doppler imaging.

Superficial Great Saphenous Vein: No evidence of thrombus. Normal
compressibility and flow on color Doppler imaging.

Other Findings:  None.
IMPRESSION: Sonographic survey of the bilateral lower extremities negative for
DVT

## 2021-07-05 NOTE — Progress Notes (Signed)
Physical Therapy Treatment Patient Details Name: Jordan [name] MRN: [id number] DOB: 1966/04/22 Today's Date: 07/05/2021   History of Present Illness Patient is a 55 y.o with significant PMH of ESRD on HD MWF although noncompliant with HD, HTN, CHF, atrial fibrillation and COPD who presented to the ED with chief complaints of acute onset worsening dyspnea, chest discomfort, and abdominal distention after missing multiple dialysis session due to being incarcerated for 1 week. Admitted under hospitalist service for acute on chronic respiratory failure, CHF and A. fib with RVR. Patient required intubation but is now extubated.    PT Comments    Pt resting in bed upon PT arrival; agreeable to ambulation but declined stairs trial during session.  Modified independent with bed mobility; independent with transfers; and SBA with ambulation 200 feet (no AD).  Pt steady throughout sessions activities (no loss of balance noted).  O2 sats 88% post ambulation on 2 L O2 but 93% or greater at rest on 2 L O2 via nasal cannula during session.  Pt progressing well with functional mobility; anticipate no further PT needs upon hospital discharge--TOC notified.   Recommendations for follow up therapy are one component of a multi-disciplinary discharge planning process, led by the attending physician.  Recommendations may be updated based on patient status, additional functional criteria and insurance authorization.  Follow Up Recommendations  No PT follow up;Supervision - Intermittent     Equipment Recommendations  None recommended by PT    Recommendations for Other Services       Precautions / Restrictions Precautions Precautions: Fall Precaution Comments: O2 Restrictions Weight Bearing Restrictions: No     Mobility  Bed Mobility Overal bed mobility: Modified Independent             General bed mobility comments: Semi-supine to/from sitting with HOB elevated; no difficulties noted     Transfers Overall transfer level: Independent Equipment used: None Transfers: Sit to/from Stand Sit to Stand: Independent         General transfer comment: steady safe transfers noted  Ambulation/Gait Ambulation/Gait assistance: Supervision Gait Distance (Feet): 200 Feet Assistive device: None Gait Pattern/deviations: Step-through pattern Gait velocity: mildly decreased   General Gait Details: steady ambulation   Stairs Stairs:  (pt declined to trial stairs during session)           Wheelchair Mobility    Modified Rankin (Stroke Patients Only)       Balance Overall balance assessment: Needs assistance Sitting-balance support: No upper extremity supported;Feet supported Sitting balance-Leahy Scale: Normal Sitting balance - Comments: steady sitting putting on socks   Standing balance support: No upper extremity supported;During functional activity Standing balance-Leahy Scale: Normal Standing balance comment: no loss of balance noted during standing/ambulation during session                            Cognition Arousal/Alertness: Awake/alert Behavior During Therapy: WFL for tasks assessed/performed Overall Cognitive Status: Within Functional Limits for tasks assessed                                        Exercises      General Comments  Nursing cleared pt for participation in physical therapy.  Pt agreeable to PT session.      Pertinent Vitals/Pain Pain Assessment: Faces Faces Pain Scale: Hurts little more Pain Location: LE "bumps" burning Pain Descriptors /  Indicators: Burning Pain Intervention(s): Limited activity within patient's tolerance;Monitored during session;Repositioned;Other (comment) (MD Ayiku and nurse notified) HR WFL during sessions activities.    Home Living                      Prior Function            PT Goals (current goals can now be found in the care plan section) Acute Rehab PT  Goals Patient Stated Goal: to go home PT Goal Formulation: With patient Time For Goal Achievement: 07/14/21 Potential to Achieve Goals: Good Progress towards PT goals: Progressing toward goals    Frequency    Min 2X/week      PT Plan Discharge plan needs to be updated (TOC notified)    Co-evaluation              AM-PAC PT "6 Clicks" Mobility   Outcome Measure  Help needed turning from your back to your side while in a flat bed without using bedrails?: None Help needed moving from lying on your back to sitting on the side of a flat bed without using bedrails?: None Help needed moving to and from a bed to a chair (including a wheelchair)?: None Help needed standing up from a chair using your arms (e.g., wheelchair or bedside chair)?: None Help needed to walk in hospital room?: A Little Help needed climbing 3-5 steps with a railing? : A Little 6 Click Score: 22    End of Session Equipment Utilized During Treatment: Oxygen Activity Tolerance: Patient tolerated treatment well Patient left: in bed;with call bell/phone within reach Nurse Communication: Mobility status;Precautions PT Visit Diagnosis: Unsteadiness on feet (R26.81);Muscle weakness (generalized) (M62.81)     Time: [id number] PT Time Calculation (min) (ACUTE ONLY): 23 min  Charges:  $Therapeutic Exercise: 8-22 mins $Therapeutic Activity: 8-22 mins                    Leitha Bleak, PT 07/05/21, 9:38 AM

## 2021-07-05 NOTE — Progress Notes (Signed)
[facility]  ROUNDING NOTE   Subjective:   [name] was admitted to [facility] on 06/26/2021 for End stage renal disease ([place]) [N18.6] Atrial fibrillation with RVR ([place]) [I48.91]  With acute respiratory failure requiring intubated   Patient seen laying in bed, alert Denies abdominal pain, states he is ready to go home Oxygen weaned to 3 L  Objective:  Vital signs in last 24 hours:  Temp:  [97.4 F (36.3 C)-98.2 F (36.8 C)] 98.2 F (36.8 C) (10/14 1109) Pulse Rate:  [60-73] 71 (10/14 1109) Resp:  [16-22] 16 (10/14 1109) BP: (107-141)/(61-90) 141/90 (10/14 1109) SpO2:  [53 %-100 %] 100 % (10/14 1109) Weight:  [67.9 kg] 67.9 kg (10/13 2358)  Weight change: -10.1 kg Filed Weights   07/03/21 0752 07/03/21 0823 07/04/21 2358  Weight: 78 kg 85.1 kg 67.9 kg    Intake/Output: I/O last 3 completed shifts: In: 240 [P.O.:240] Out: -    Intake/Output this shift:  No intake/output data recorded.  Physical Exam: General: NAD  Head: Moist oral mucus membranes  Eyes: Anicteric  Lungs:  Remains diminished bilaterally, 3L [place] O2  Heart: Regular rate and rhythm   Abdomen:  Soft, nontender, distended  Extremities:  no peripheral edema.  Neurologic: Alert and oriented  Skin: Multiple abrasions  Access: Left aneurysmal AVF    Basic Metabolic Panel: Recent Labs  Lab 06/29/21 0418 06/30/21 0537 07/01/21 0535 07/01/21 1120 07/02/21 0551 07/04/21 0428  NA 135 139 139  --  137 136  K 5.1 5.5* 6.3* 4.1 5.4* 3.9  CL 95* 97* 98  --  95* 94*  CO2 25 25 17*  --  19* 24  GLUCOSE 120* 41* 111*  --  81 77  BUN 62* 89* 99*  --  90* 50*  CREATININE 9.61* 10.53* 11.83*  --  9.68* 6.43*  CALCIUM 8.7* 8.2* 8.4*  --  8.3* 7.1*  MG 1.9 2.1  --   --   --   --   PHOS 8.2* 8.9*  --   --   --   --      Liver Function Tests: Recent Labs  Lab 06/30/21 0537  ALBUMIN 3.3*    No results for input(s): LIPASE, AMYLASE in the last 168 hours. No results for input(s):  AMMONIA in the last 168 hours.  CBC: Recent Labs  Lab 06/29/21 0418 06/30/21 0537 07/01/21 0535 07/03/21 0430 07/04/21 0428  WBC 9.1 18.0* 14.8* 16.4* 14.4*  NEUTROABS 8.4*  --  13.3* 15.0* 12.4*  HGB 8.0* 8.0* 8.9* 8.3* 8.3*  HCT 25.3* 25.8* 26.8* 27.1* 26.4*  MCV 98.4 96.6 94.0 103.8* 102.7*  PLT 137* 130* 155 135* 125*     Cardiac Enzymes: No results for input(s): CKTOTAL, CKMB, CKMBINDEX, TROPONINI in the last 168 hours.  BNP: Invalid input(s): POCBNP  CBG: Recent Labs  Lab 07/04/21 1126 07/04/21 2040 07/04/21 2122 07/05/21 0734 07/05/21 1109  GLUCAP 91 32* 89 111* 94     Microbiology: Results for orders placed or performed during the hospital encounter of 06/26/21  Resp Panel by RT-PCR (Flu A&B, Covid) Nasopharyngeal Swab     Status: None   Collection Time: 06/26/21  8:04 AM   Specimen: Nasopharyngeal Swab; Nasopharyngeal(NP) swabs in vial transport medium  Result Value Ref Range Status   SARS Coronavirus 2 by RT PCR NEGATIVE NEGATIVE Final    Comment: (NOTE) SARS-CoV-2 target nucleic acids are NOT DETECTED.  The SARS-CoV-2 RNA is generally detectable in upper respiratory specimens  during the acute phase of infection. The lowest concentration of SARS-CoV-2 viral copies this assay can detect is 138 copies/mL. A negative result does not preclude SARS-Cov-2 infection and should not be used as the sole basis for treatment or other patient management decisions. A negative result may occur with  improper specimen collection/handling, submission of specimen other than nasopharyngeal swab, presence of viral mutation(s) within the areas targeted by this assay, and inadequate number of viral copies(<138 copies/mL). A negative result must be combined with clinical observations, patient history, and epidemiological information. The expected result is Negative.  Fact Sheet for Patients:  EntrepreneurPulse.com.au  Fact Sheet for Healthcare  Providers:  IncredibleEmployment.be  This test is no t yet approved or cleared by the [place] FDA and  has been authorized for detection and/or diagnosis of SARS-CoV-2 by FDA under an Emergency Use Authorization (EUA). This EUA will remain  in effect (meaning this test can be used) for the duration of the COVID-19 declaration under Section 564(b)(1) of the Act, 21 U.S.C.section 360bbb-3(b)(1), unless the authorization is terminated  or revoked sooner.       Influenza A by PCR NEGATIVE NEGATIVE Final   Influenza B by PCR NEGATIVE NEGATIVE Final    Comment: (NOTE) The Xpert Xpress SARS-CoV-2/FLU/RSV plus assay is intended as an aid in the diagnosis of influenza from Nasopharyngeal swab specimens and should not be used as a sole basis for treatment. Nasal washings and aspirates are unacceptable for Xpert Xpress SARS-CoV-2/FLU/RSV testing.  Fact Sheet for Patients: EntrepreneurPulse.com.au  Fact Sheet for Healthcare Providers: IncredibleEmployment.be  This test is not yet approved or cleared by the [place] FDA and has been authorized for detection and/or diagnosis of SARS-CoV-2 by FDA under an Emergency Use Authorization (EUA). This EUA will remain in effect (meaning this test can be used) for the duration of the COVID-19 declaration under Section 564(b)(1) of the Act, 21 U.S.C. section 360bbb-3(b)(1), unless the authorization is terminated or revoked.  Performed at [facility], [address] [place], [place] [address]   Culture, Respiratory w Gram Stain     Status: None   Collection Time: 06/28/21  4:00 PM   Specimen: Tracheal Aspirate; Respiratory  Result Value Ref Range Status   Specimen Description   Final    TRACHEAL ASPIRATE Performed at [facility], [address] [place], [place] [address]    Special Requests   Final    NONE Performed at [facility], [address] [place], [place] [address]    Gram Stain   Final    NO SQUAMOUS EPITHELIAL CELLS SEEN ABUNDANT WBC PRESENT,BOTH PMN AND MONONUCLEAR ABUNDANT GRAM POSITIVE COCCI IN CHAINS IN CLUSTERS MODERATE GRAM NEGATIVE RODS RARE GRAM POSITIVE RODS    Culture   Final    MODERATE Normal respiratory flora-no Staph aureus or Pseudomonas seen Performed at [facility], [place] [address], [place], [place] [address]    Report Status 06/30/2021 FINAL  Final  Body fluid culture w Gram Stain     Status: None (Preliminary result)   Collection Time: 07/04/21  4:07 PM   Specimen: PATH Cytology Peritoneal fluid  Result Value Ref Range Status   Specimen Description   Final    PERITONEAL Performed at [facility], [address], [place],  [address]    Special Requests   Final    PERITONEAL Performed at [facility], [place], [place] [address]    Gram Stain NO WBC  SEEN NO ORGANISMS SEEN   Final   Culture   Final    NO GROWTH < 24 HOURS Performed at [facility], [place] [address], [place], [place] [address]    Report Status PENDING  Incomplete    Coagulation Studies: No results for input(s): LABPROT, INR in the last 72 hours.  Urinalysis: No results for input(s): COLORURINE, LABSPEC, PHURINE, GLUCOSEU, HGBUR, BILIRUBINUR, KETONESUR, PROTEINUR, UROBILINOGEN, NITRITE, LEUKOCYTESUR in the last 72 hours.  Invalid input(s): APPERANCEUR    Imaging: DG Abd 1 View  Result Date: 07/03/2021 CLINICAL DATA:  Abdominal pain EXAM: ABDOMEN - 1 VIEW COMPARISON:  None. FINDINGS: There is loss of the properitoneal fat flank stripes and central crowding of aerated loops of bowel suggesting underlying ascites. Underpenetration precludes evaluation of the visceral shadows. Nonobstructive bowel gas pattern. Calcifications within the mid abdomen correspond to renovascular calcifications. Advanced vascular calcifications noted within the pelvis. No gross free  intraperitoneal gas. IMPRESSION: Suspected at least moderate ascites. Nonobstructive bowel gas pattern. . Electronically Signed   By: Fidela Salisbury M.D.   On: 07/03/2021 22:56   US Venous Img Lower Bilateral (DVT)  Result Date: 07/05/2021 CLINICAL DATA:  55 year old male with a history bilateral leg pain EXAM: BILATERAL LOWER EXTREMITY VENOUS DOPPLER ULTRASOUND TECHNIQUE: Gray-scale sonography with graded compression, as well as color Doppler and duplex ultrasound were performed to evaluate the lower extremity deep venous systems from the level of the common femoral vein and including the common femoral, femoral, profunda femoral, popliteal and calf veins including the posterior tibial, peroneal and gastrocnemius veins when visible. The superficial great saphenous vein was also interrogated. Spectral Doppler was utilized to evaluate flow at rest and with distal augmentation maneuvers in the common femoral, femoral and popliteal veins. COMPARISON:  None. FINDINGS: RIGHT LOWER EXTREMITY Common Femoral Vein: No evidence of thrombus. Normal compressibility, respiratory phasicity and response to augmentation. Saphenofemoral Junction: No evidence of thrombus. Normal compressibility and flow on color Doppler imaging. Profunda Femoral Vein: No evidence of thrombus. Normal compressibility and flow on color Doppler imaging. Femoral Vein: No evidence of thrombus. Normal compressibility, respiratory phasicity and response to augmentation. Popliteal Vein: No evidence of thrombus. Normal compressibility, respiratory phasicity and response to augmentation. Calf Veins: No evidence of thrombus. Normal compressibility and flow on color Doppler imaging. Superficial Great Saphenous Vein: No evidence of thrombus. Normal compressibility and flow on color Doppler imaging. Other Findings:  None. LEFT LOWER EXTREMITY Common Femoral Vein: No evidence of thrombus. Normal compressibility, respiratory phasicity and response to  augmentation. Saphenofemoral Junction: No evidence of thrombus. Normal compressibility and flow on color Doppler imaging. Profunda Femoral Vein: No evidence of thrombus. Normal compressibility and flow on color Doppler imaging. Femoral Vein: No evidence of thrombus. Normal compressibility, respiratory phasicity and response to augmentation. Popliteal Vein: No evidence of thrombus. Normal compressibility, respiratory phasicity and response to augmentation. Calf Veins: No evidence of thrombus. Normal compressibility and flow on color Doppler imaging. Superficial Great Saphenous Vein: No evidence of thrombus. Normal compressibility and flow on color Doppler imaging. Other Findings:  None. IMPRESSION: Sonographic survey of the bilateral lower extremities negative for DVT Electronically Signed   By: Corrie Mckusick D.O.   On: 07/05/2021 11:18   US Paracentesis  Result Date: 07/04/2021 INDICATION: Ascites, request for paracentesis. EXAM: ULTRASOUND GUIDED  PARACENTESIS MEDICATIONS: Lidocaine 1% only. COMPLICATIONS: None immediate. PROCEDURE: Informed written consent was obtained from the patient after a discussion of the risks, benefits and alternatives to treatment. A timeout was performed prior  to the initiation of the procedure. Initial ultrasound scanning demonstrates a large amount of ascites within the right lower abdominal quadrant. The right lower abdomen was prepped and draped in the usual sterile fashion. 1% lidocaine was used for local anesthesia. Following this, a 6 Fr Safe-T-Centesis catheter was introduced. An ultrasound image was saved for documentation purposes. The paracentesis was performed. The catheter was removed and a dressing was applied. The patient tolerated the procedure well without immediate post procedural complication. FINDINGS: A total of approximately 5 L of clear yellow fluid was removed. Samples were sent to the laboratory as requested by the clinical team. IMPRESSION: Successful  ultrasound-guided paracentesis yielding 5 liters of peritoneal fluid. Read By: Tsosie Billing PA-C Electronically Signed   By: Markus Daft M.D.   On: 07/04/2021 16:41     Medications:    sodium chloride      sodium chloride   Intravenous Once   calcium acetate  2,001 mg Oral TID WC   Chlorhexidine Gluconate Cloth  6 each Topical Q0600   cinacalcet  30 mg Oral Q breakfast   epoetin (EPOGEN/PROCRIT) injection  10,000 Units Intravenous Q M,W,F-HD   famotidine  20 mg Oral BID   ferrous sulfate  325 mg Oral BID WC   heparin  5,000 Units Subcutaneous Q8H   hydrocerin   Topical BID   hydrocortisone   Rectal TID   mouth rinse  15 mL Mouth Rinse BID   metoprolol tartrate  50 mg Oral BID     Assessment/ Plan:  [name] is a 55 y.o. black male with end stage renal disease on hemodialysis, hypertension, COPD, EtOH abuse, atrial fibrillation, congestive heart failure who is admitted to [facility] on End stage renal disease ([place]) [N18.6] Atrial fibrillation with RVR ([facility]) [I48.91]   [facility] MWF [facility]. Left AVF 67kg  End Stage Renal Disease: hyperkalemia, pulmonary edema and volume overload on admission.   - Stable potassium -Scheduled for dialysis today, plan to discharge and receive dialysis at outpatient clinic.  Hypotension: with hypertensive urgency with atrial fibrillation with rapid ventricular response on admission. Weaned off norepinephrine - holding home blood pressure agents.  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 - Continue calcium acetate with meals - continue cinacalcet daily  Acute respiratory failure: requiring intubation and mechanical ventilation. Secondary to pulmonary edema. Extubated and stable since dialysis.    Anemia with chronic kidney disease:  status post PRBC transfusion on 10/7.  getting outpatient Mircera as ESA.  - ESA with HD treatment    LOS: 9 [place] 10/14/20221:47 PM

## 2021-07-05 NOTE — Discharge Summary (Signed)
Physician Discharge Summary  Jordan [name] [id number] DOB: [birth year] DOA: 06/26/2021  PCP: Pcp, No  Admit date: 06/26/2021 Discharge date: 07/05/2021  Discharge disposition: Home   Recommendations for Outpatient Follow-Up:   Follow up with PCP in 1 week. Continue outpatient hemodialysis on Mondays, Wednesdays and Fridays   Discharge Diagnosis:   Active Problems:   ESRD on dialysis [facility])   Essential hypertension   Elevated troponin   Anemia in ESRD (end-stage renal disease) ([place])   Acute on chronic respiratory failure with hypoxia (HCC)   Acute on chronic combined systolic and diastolic CHF (congestive heart failure) (HCC)   Atrial fibrillation with RVR (HCC)   Thrombocytopenia (HCC)   COPD (chronic obstructive pulmonary disease) (HCC)   Hyperkalemia    Discharge Condition: Stable.  Diet recommendation:  Diet Order             Diet renal 60/70-10-24-1198           Diet renal/carb modified with fluid restriction Diet-HS Snack? Nothing; Fluid restriction: 1200 mL Fluid; Room service appropriate? Yes; Fluid consistency: Thin  Diet effective now                     Code Status: Partial Code     Hospital Course:   [name] is a 55 y.o. male with medical history significant for ESRD, hypertension, COPD, chronic systolic and diastolic CHF, GI bleeding, internal hemorrhoids, who presented to the hospital because of shortness of breath, chest discomfort, leg swelling and abdominal distention after missing multiple dialysis sessions.  He said he had missed dialysis because he had been incarcerated for a week.   He was admitted to the hospital for acute on chronic diastolic CHF, ESRD with fluid overload, atrial fibrillation with rapid ventricular response and acute on chronic hypoxemic respiratory failure.  He was treated with IV Lasix, IV Cardizem drip and supplemental oxygen.  He was seen by the nephrologist and he underwent hemodialysis for fluid  overload.   During hospitalization, patient was noted to have significant neck swelling and oral/tongue swelling.  Anaphylactic reaction was suspected so he was treated with IM epinephrine, IV Benadryl and IV steroids steroids.  He was intubated and placed on mechanical ventilation and also required Levophed for hypotension. Swelling improved after hemodialysis.  Orofacial swelling was later attributed to fluid overload rather than anaphylactic reaction.  He was also found to have ascites.  Paracentesis yielded 5 L of ascitic fluid.   He was evaluated with a gastroenterologist for rectal bleeding.  This was thought to be from hemorrhoids.  He was treated with steroid suppositories.   His condition has improved and he is deemed stable for discharge to home.  He was initially discharged on 07/04/2021.  However, he could not get transportation home.  The case manager's attempt to arrange transportation was unsuccessful.  He stayed in the hospital another night and was able to go home the following day.  Of note, patient complained of pain in the back of his legs (cough) on the day of discharge.  Venous duplex of the lower extremity was negative for DVT.      Medical Consultants:   Cardiologist Nephrologist Gastroenterologist Intensivist   Discharge Exam:    Vitals:   07/04/21 2358 07/05/21 0522 07/05/21 0733 07/05/21 1109  BP: 114/77  109/61 (!) 141/90  Pulse: 60  73 71  Resp: '16  16 16  '$ Temp: 98 F (36.7 C)  97.9 F (36.6 C) 98.2  F (36.8 C)  TempSrc: Oral     SpO2: 92% 95% 100% 100%  Weight: 67.9 kg     Height:         GEN: NAD SKIN: Warm and dry EYES: No pallor or icterus ENT: MMM CV: RRR PULM: CTA B ABD: soft, ND, NT, +BS CNS: AAO x 3, non focal EXT: Bilateral posterior leg tenderness but no edema or erythema   The results of significant diagnostics from this hospitalization (including imaging, microbiology, ancillary and laboratory) are listed below for reference.      Procedures and Diagnostic Studies:   DG Chest 1 View  Result Date: 06/27/2021 CLINICAL DATA:  Anaphylactic reaction EXAM: CHEST  1 VIEW COMPARISON:  06/26/2021 FINDINGS: Cardiac size is moderately enlarged, similar to prior examination and similar to remote prior examination of 04/26/2021. A component of this was related to an underlying pericardial effusion better appreciated on CT examination of 05/03/2021. Superimposed mild to moderate diffuse interstitial pulmonary edema has progressed slightly in the interval since prior examination. No pneumothorax or pleural effusion. Pulmonary insufflation is stable, the lung volumes are small. No acute bone abnormality. IMPRESSION: Stable cardiomegaly, a component which likely relates to an underlying pericardial effusion as better seen on CT examination of 05/03/2021. Progressive mild to moderate pulmonary edema. Pulmonary hypoinflation. Electronically Signed   By: Fidela Salisbury M.D.   On: 06/27/2021 21:29   DG Chest Portable 1 View  Result Date: 06/26/2021 CLINICAL DATA:  Short of breath EXAM: PORTABLE CHEST 1 VIEW COMPARISON:  None. FINDINGS: Stable enlarged cardiac silhouette. There is diffuse mild airspace disease. No focal consolidation. Pleural fluid or pneumothorax. IMPRESSION: Cardiomegaly and pulmonary edema pattern. Potential component of pericardial effusion. Electronically Signed   By: Suzy Bouchard M.D.   On: 06/26/2021 08:35     Labs:   Basic Metabolic Panel: Recent Labs  Lab 06/29/21 0418 06/30/21 0537 07/01/21 0535 07/01/21 1120 07/02/21 0551 07/04/21 0428  NA 135 139 139  --  137 136  K 5.1 5.5* 6.3*   < > 5.4* 3.9  CL 95* 97* 98  --  95* 94*  CO2 25 25 17*  --  19* 24  GLUCOSE 120* 41* 111*  --  81 77  BUN 62* 89* 99*  --  90* 50*  CREATININE 9.61* 10.53* 11.83*  --  9.68* 6.43*  CALCIUM 8.7* 8.2* 8.4*  --  8.3* 7.1*  MG 1.9 2.1  --   --   --   --   PHOS 8.2* 8.9*  --   --   --   --    < > = values in this  interval not displayed.   GFR Estimated Creatinine Clearance: 11.1 mL/min (A) (by C-G formula based on SCr of 6.43 mg/dL (H)). Liver Function Tests: Recent Labs  Lab 06/30/21 0537  ALBUMIN 3.3*   No results for input(s): LIPASE, AMYLASE in the last 168 hours. No results for input(s): AMMONIA in the last 168 hours. Coagulation profile No results for input(s): INR, PROTIME in the last 168 hours.  CBC: Recent Labs  Lab 06/29/21 0418 06/30/21 0537 07/01/21 0535 07/03/21 0430 07/04/21 0428  WBC 9.1 18.0* 14.8* 16.4* 14.4*  NEUTROABS 8.4*  --  13.3* 15.0* 12.4*  HGB 8.0* 8.0* 8.9* 8.3* 8.3*  HCT 25.3* 25.8* 26.8* 27.1* 26.4*  MCV 98.4 96.6 94.0 103.8* 102.7*  PLT 137* 130* 155 135* 125*   Cardiac Enzymes: No results for input(s): CKTOTAL, CKMB, CKMBINDEX, TROPONINI in the last 168  hours. BNP: Invalid input(s): POCBNP CBG: Recent Labs  Lab 07/04/21 1126 07/04/21 2040 07/04/21 2122 07/05/21 0734 07/05/21 1109  GLUCAP 91 32* 89 111* 94   D-Dimer No results for input(s): DDIMER in the last 72 hours. Hgb A1c No results for input(s): HGBA1C in the last 72 hours. Lipid Profile No results for input(s): CHOL, HDL, LDLCALC, TRIG, CHOLHDL, LDLDIRECT in the last 72 hours. Thyroid function studies No results for input(s): TSH, T4TOTAL, T3FREE, THYROIDAB in the last 72 hours.  Invalid input(s): FREET3 Anemia work up No results for input(s): VITAMINB12, FOLATE, FERRITIN, TIBC, IRON, RETICCTPCT in the last 72 hours. Microbiology Recent Results (from the past 240 hour(s))  Resp Panel by RT-PCR (Flu A&B, Covid) Nasopharyngeal Swab     Status: None   Collection Time: 06/26/21  8:04 AM   Specimen: Nasopharyngeal Swab; Nasopharyngeal(NP) swabs in vial transport medium  Result Value Ref Range Status   SARS Coronavirus 2 by RT PCR NEGATIVE NEGATIVE Final    Comment: (NOTE) SARS-CoV-2 target nucleic acids are NOT DETECTED.  The SARS-CoV-2 RNA is generally detectable in upper  respiratory specimens during the acute phase of infection. The lowest concentration of SARS-CoV-2 viral copies this assay can detect is 138 copies/mL. A negative result does not preclude SARS-Cov-2 infection and should not be used as the sole basis for treatment or other patient management decisions. A negative result may occur with  improper specimen collection/handling, submission of specimen other than nasopharyngeal swab, presence of viral mutation(s) within the areas targeted by this assay, and inadequate number of viral copies(<138 copies/mL). A negative result must be combined with clinical observations, patient history, and epidemiological information. The expected result is Negative.  Fact Sheet for Patients:  EntrepreneurPulse.com.au  Fact Sheet for Healthcare Providers:  IncredibleEmployment.be  This test is no t yet approved or cleared by the [place] FDA and  has been authorized for detection and/or diagnosis of SARS-CoV-2 by FDA under an Emergency Use Authorization (EUA). This EUA will remain  in effect (meaning this test can be used) for the duration of the COVID-19 declaration under Section 564(b)(1) of the Act, 21 U.S.C.section 360bbb-3(b)(1), unless the authorization is terminated  or revoked sooner.       Influenza A by PCR NEGATIVE NEGATIVE Final   Influenza B by PCR NEGATIVE NEGATIVE Final    Comment: (NOTE) The Xpert Xpress SARS-CoV-2/FLU/RSV plus assay is intended as an aid in the diagnosis of influenza from Nasopharyngeal swab specimens and should not be used as a sole basis for treatment. Nasal washings and aspirates are unacceptable for Xpert Xpress SARS-CoV-2/FLU/RSV testing.  Fact Sheet for Patients: EntrepreneurPulse.com.au  Fact Sheet for Healthcare Providers: IncredibleEmployment.be  This test is not yet approved or cleared by the [place] FDA and has been  authorized for detection and/or diagnosis of SARS-CoV-2 by FDA under an Emergency Use Authorization (EUA). This EUA will remain in effect (meaning this test can be used) for the duration of the COVID-19 declaration under Section 564(b)(1) of the Act, 21 U.S.C. section 360bbb-3(b)(1), unless the authorization is terminated or revoked.  Performed at [facility], [address] [place], [place] [address]   Culture, Respiratory w Gram Stain     Status: None   Collection Time: 06/28/21  4:00 PM   Specimen: Tracheal Aspirate; Respiratory  Result Value Ref Range Status   Specimen Description   Final    TRACHEAL ASPIRATE Performed at [facility], [address], [place], [place] [address]  Special Requests   Final    NONE Performed at [facility], [address] [place], [place] [address]    Gram Stain   Final    NO SQUAMOUS EPITHELIAL CELLS SEEN ABUNDANT WBC PRESENT,BOTH PMN AND MONONUCLEAR ABUNDANT GRAM POSITIVE COCCI IN CHAINS IN CLUSTERS MODERATE GRAM NEGATIVE RODS RARE GRAM POSITIVE RODS    Culture   Final    MODERATE Normal respiratory flora-no Staph aureus or Pseudomonas seen Performed at [facility], [place] [address], [place], [place] [address]    Report Status 06/30/2021 FINAL  Final  Body fluid culture w Gram Stain     Status: None (Preliminary result)   Collection Time: 07/04/21  4:07 PM   Specimen: PATH Cytology Peritoneal fluid  Result Value Ref Range Status   Specimen Description   Final    PERITONEAL Performed at [facility], [address] [place], [place] [address]    Special Requests   Final    PERITONEAL Performed at [facility], [place], [place] [address]    Gram Stain NO WBC SEEN NO ORGANISMS SEEN   Final   Culture   Final    NO GROWTH < 24 HOURS Performed at [facility], [place] [address] [place], [place] [address]    Report Status PENDING  Incomplete      Discharge Instructions:   Discharge Instructions     Diet renal 60/70-10-24-1198   Complete by: As directed    Discharge instructions   Complete by: As directed    Avoid alcohol, tobacco or illicit drugs. Go to the outpatient hemodialysis center on Mondays, Wednesdays and Fridays for hemodialysis.   Increase activity slowly   Complete by: As directed       Allergies as of 07/05/2021       Reactions   Lisinopril Swelling        Medication List     STOP taking these medications    hydrALAZINE 50 MG tablet Commonly known as: APRESOLINE       TAKE these medications    albuterol 108 (90 Base) MCG/ACT inhaler Commonly known as: VENTOLIN HFA Inhale 2 puffs into the lungs every 4 (four) hours as needed for wheezing or shortness of breath.   calcium acetate 667 MG capsule Commonly known as: PHOSLO Take 2 capsules (1,334 mg total) by mouth 3 (three) times daily with meals.   cinacalcet 30 MG tablet Commonly known as: SENSIPAR Take 1 tablet (30 mg total) by mouth daily with breakfast.   docusate sodium 100 MG capsule Commonly known as: COLACE Take 1 capsule (100 mg total) by mouth 2 (two) times daily.   epoetin alfa 4000 UNIT/ML injection Commonly known as: EPOGEN Inject 1 mL (4,000 Units total) into the vein every Monday, Wednesday, and Friday with hemodialysis.   ferrous sulfate 325 (65 FE) MG tablet Take 1 tablet (325 mg total) by mouth 2 (two) times daily with a meal.   metoprolol tartrate 50 MG tablet Commonly known as: LOPRESSOR Take 1 tablet (50 mg total) by mouth 2 (two) times daily.   pantoprazole 20 MG tablet Commonly known as: Protonix Take 1 tablet (20 mg total) by mouth 2 (two) times daily before a meal.           If you experience worsening of your admission symptoms, develop shortness of breath, life threatening emergency, suicidal or homicidal thoughts you must seek medical attention immediately by calling 911 or calling your MD  immediately  if symptoms less severe.   You must read complete instructions/literature along with all the possible adverse reactions/side effects for all the medicines you take and that have been prescribed to you. Take any new medicines after you have completely understood and accept all the possible adverse reactions/side effects.    Please note   You were cared for by a hospitalist during your hospital stay. If you have any questions about your discharge medications or the care you received while you were in the hospital after you are discharged, you can call the unit and asked to speak with the hospitalist on call if the hospitalist that took care of you is not available. Once you are discharged, your primary care physician will handle any further medical issues. Please note that NO REFILLS for any discharge medications will be authorized once you are discharged, as it is imperative that you return to your primary care physician (or establish a relationship with a primary care physician if you do not have one) for your aftercare needs so that they can reassess your need for medications and monitor your lab values.       Time coordinating discharge: 34 minutes  Signed:  Kaevion Sinclair  [facility] 07/05/2021, 5:11 PM   Pager on [email or web address]. If 7PM-7AM, please contact night-coverage at [email or web address]

## 2021-07-05 NOTE — TOC Progression Note (Signed)
Transition of Care [facility]) - Progression Note    Patient Details  Name: ARBAAZ DECLERCQ MRN: [id number] Date of Birth: June 20, 1966  Transition of Care [facility]) CM/SW [place], [place] Phone Number: 07/05/2021, 9:52 AM  Clinical Narrative:     Per PT following session this morning, patient no longer in need of home health services. [facility] informed.   CSW called [facility] at [phone] they report they are able to pick patient up from [facility] main entrance at 11:00 am, for him to be transported to his HD center at [facility] by 11:50 for his sit time of 12:10.    HD coordinator [place] updated.   No other needs identified at this time.    Expected Discharge Plan: [place] Barriers to Discharge: No Barriers Identified  Expected Discharge Plan and Services Expected Discharge Plan: [place] Choice: [place] arrangements for the past 2 months: Single Family Home Expected Discharge Date: 07/04/21                         HH Arranged: PT, RN [place] Agency: [place] Date [place]: 07/04/21 Time [place]: 1210 Representative spoke with at [place]: [place] Determinants of Health ([place]) Interventions    Readmission Risk Interventions No flowsheet data found.

## 2021-07-08 LAB — BODY FLUID CULTURE W GRAM STAIN
Culture: NO GROWTH
Gram Stain: NONE SEEN

## 2021-07-08 LAB — CYTOLOGY - NON PAP

## 2021-07-12 ENCOUNTER — Other Ambulatory Visit: Payer: Self-pay

## 2021-07-12 ENCOUNTER — Emergency Department: Payer: Medicare Other

## 2021-07-12 ENCOUNTER — Encounter: Payer: Self-pay | Admitting: Radiology

## 2021-07-12 ENCOUNTER — Inpatient Hospital Stay
Admission: EM | Admit: 2021-07-12 | Discharge: 2021-07-16 | DRG: 314 | Discharge status: Against Medical Advice | Payer: Medicare Other | Attending: Internal Medicine | Admitting: Internal Medicine

## 2021-07-12 DIAGNOSIS — N2581 Secondary hyperparathyroidism of renal origin: Secondary | ICD-10-CM | POA: Diagnosis not present

## 2021-07-12 DIAGNOSIS — G9341 Metabolic encephalopathy: Secondary | ICD-10-CM | POA: Diagnosis present

## 2021-07-12 DIAGNOSIS — Y832 Surgical operation with anastomosis, bypass or graft as the cause of abnormal reaction of the patient, or of later complication, without mention of misadventure at the time of the procedure: Secondary | ICD-10-CM | POA: Diagnosis not present

## 2021-07-12 DIAGNOSIS — J811 Chronic pulmonary edema: Secondary | ICD-10-CM | POA: Diagnosis present

## 2021-07-12 DIAGNOSIS — F1010 Alcohol abuse, uncomplicated: Secondary | ICD-10-CM | POA: Diagnosis present

## 2021-07-12 DIAGNOSIS — N186 End stage renal disease: Secondary | ICD-10-CM | POA: Diagnosis present

## 2021-07-12 DIAGNOSIS — L8990 Pressure ulcer of unspecified site, unspecified stage: Secondary | ICD-10-CM | POA: Insufficient documentation

## 2021-07-12 DIAGNOSIS — J9621 Acute and chronic respiratory failure with hypoxia: Secondary | ICD-10-CM | POA: Diagnosis present

## 2021-07-12 DIAGNOSIS — T82898A Other specified complication of vascular prosthetic devices, implants and grafts, initial encounter: Secondary | ICD-10-CM

## 2021-07-12 DIAGNOSIS — R7881 Bacteremia: Secondary | ICD-10-CM | POA: Diagnosis present

## 2021-07-12 DIAGNOSIS — I480 Paroxysmal atrial fibrillation: Secondary | ICD-10-CM | POA: Diagnosis present

## 2021-07-12 DIAGNOSIS — E440 Moderate protein-calorie malnutrition: Secondary | ICD-10-CM | POA: Diagnosis present

## 2021-07-12 DIAGNOSIS — D631 Anemia in chronic kidney disease: Secondary | ICD-10-CM | POA: Diagnosis present

## 2021-07-12 DIAGNOSIS — Z9114 Patient's other noncompliance with medication regimen: Secondary | ICD-10-CM

## 2021-07-12 DIAGNOSIS — Z888 Allergy status to other drugs, medicaments and biological substances status: Secondary | ICD-10-CM

## 2021-07-12 DIAGNOSIS — R0989 Other specified symptoms and signs involving the circulatory and respiratory systems: Secondary | ICD-10-CM

## 2021-07-12 DIAGNOSIS — T827XXA Infection and inflammatory reaction due to other cardiac and vascular devices, implants and grafts, initial encounter: Secondary | ICD-10-CM | POA: Diagnosis present

## 2021-07-12 DIAGNOSIS — Z20822 Contact with and (suspected) exposure to covid-19: Secondary | ICD-10-CM | POA: Diagnosis present

## 2021-07-12 DIAGNOSIS — B86 Scabies: Secondary | ICD-10-CM | POA: Diagnosis present

## 2021-07-12 DIAGNOSIS — E8770 Fluid overload, unspecified: Secondary | ICD-10-CM | POA: Diagnosis not present

## 2021-07-12 DIAGNOSIS — I2510 Atherosclerotic heart disease of native coronary artery without angina pectoris: Secondary | ICD-10-CM | POA: Diagnosis present

## 2021-07-12 DIAGNOSIS — Z833 Family history of diabetes mellitus: Secondary | ICD-10-CM

## 2021-07-12 DIAGNOSIS — I700 Atherosclerosis of aorta: Secondary | ICD-10-CM | POA: Diagnosis present

## 2021-07-12 DIAGNOSIS — J9601 Acute respiratory failure with hypoxia: Secondary | ICD-10-CM | POA: Diagnosis present

## 2021-07-12 DIAGNOSIS — I5043 Acute on chronic combined systolic (congestive) and diastolic (congestive) heart failure: Secondary | ICD-10-CM | POA: Diagnosis present

## 2021-07-12 DIAGNOSIS — D62 Acute posthemorrhagic anemia: Secondary | ICD-10-CM | POA: Diagnosis not present

## 2021-07-12 DIAGNOSIS — J449 Chronic obstructive pulmonary disease, unspecified: Secondary | ICD-10-CM | POA: Diagnosis present

## 2021-07-12 DIAGNOSIS — R000 Tachycardia, unspecified: Secondary | ICD-10-CM | POA: Diagnosis present

## 2021-07-12 DIAGNOSIS — N185 Chronic kidney disease, stage 5: Secondary | ICD-10-CM | POA: Diagnosis not present

## 2021-07-12 DIAGNOSIS — Z79899 Other long term (current) drug therapy: Secondary | ICD-10-CM

## 2021-07-12 DIAGNOSIS — Y712 Prosthetic and other implants, materials and accessory cardiovascular devices associated with adverse incidents: Secondary | ICD-10-CM | POA: Diagnosis present

## 2021-07-12 DIAGNOSIS — Z992 Dependence on renal dialysis: Secondary | ICD-10-CM | POA: Diagnosis not present

## 2021-07-12 DIAGNOSIS — I132 Hypertensive heart and chronic kidney disease with heart failure and with stage 5 chronic kidney disease, or end stage renal disease: Secondary | ICD-10-CM | POA: Diagnosis present

## 2021-07-12 DIAGNOSIS — T82868A Thrombosis of vascular prosthetic devices, implants and grafts, initial encounter: Principal | ICD-10-CM | POA: Diagnosis present

## 2021-07-12 DIAGNOSIS — B9689 Other specified bacterial agents as the cause of diseases classified elsewhere: Secondary | ICD-10-CM | POA: Diagnosis present

## 2021-07-12 DIAGNOSIS — Z7189 Other specified counseling: Secondary | ICD-10-CM | POA: Diagnosis not present

## 2021-07-12 DIAGNOSIS — I10 Essential (primary) hypertension: Secondary | ICD-10-CM | POA: Diagnosis present

## 2021-07-12 DIAGNOSIS — Z5329 Procedure and treatment not carried out because of patient's decision for other reasons: Secondary | ICD-10-CM | POA: Diagnosis not present

## 2021-07-12 DIAGNOSIS — T148XXA Other injury of unspecified body region, initial encounter: Secondary | ICD-10-CM | POA: Diagnosis not present

## 2021-07-12 DIAGNOSIS — F17210 Nicotine dependence, cigarettes, uncomplicated: Secondary | ICD-10-CM | POA: Diagnosis present

## 2021-07-12 DIAGNOSIS — Z5982 Transportation insecurity: Secondary | ICD-10-CM

## 2021-07-12 DIAGNOSIS — I1311 Hypertensive heart and chronic kidney disease without heart failure, with stage 5 chronic kidney disease, or end stage renal disease: Secondary | ICD-10-CM | POA: Diagnosis not present

## 2021-07-12 DIAGNOSIS — R0602 Shortness of breath: Secondary | ICD-10-CM | POA: Diagnosis not present

## 2021-07-12 DIAGNOSIS — E43 Unspecified severe protein-calorie malnutrition: Secondary | ICD-10-CM | POA: Diagnosis not present

## 2021-07-12 DIAGNOSIS — J439 Emphysema, unspecified: Secondary | ICD-10-CM | POA: Diagnosis present

## 2021-07-12 DIAGNOSIS — Z9115 Patient's noncompliance with renal dialysis: Secondary | ICD-10-CM | POA: Diagnosis not present

## 2021-07-12 DIAGNOSIS — T82590A Other mechanical complication of surgically created arteriovenous fistula, initial encounter: Secondary | ICD-10-CM | POA: Diagnosis present

## 2021-07-12 DIAGNOSIS — I959 Hypotension, unspecified: Secondary | ICD-10-CM | POA: Diagnosis not present

## 2021-07-12 DIAGNOSIS — L089 Local infection of the skin and subcutaneous tissue, unspecified: Secondary | ICD-10-CM | POA: Diagnosis not present

## 2021-07-12 DIAGNOSIS — R079 Chest pain, unspecified: Secondary | ICD-10-CM

## 2021-07-12 DIAGNOSIS — Z720 Tobacco use: Secondary | ICD-10-CM | POA: Diagnosis present

## 2021-07-12 DIAGNOSIS — Z6828 Body mass index (BMI) 28.0-28.9, adult: Secondary | ICD-10-CM

## 2021-07-12 DIAGNOSIS — Z841 Family history of disorders of kidney and ureter: Secondary | ICD-10-CM

## 2021-07-12 DIAGNOSIS — T82868D Thrombosis of vascular prosthetic devices, implants and grafts, subsequent encounter: Secondary | ICD-10-CM | POA: Diagnosis not present

## 2021-07-12 DIAGNOSIS — D72829 Elevated white blood cell count, unspecified: Secondary | ICD-10-CM | POA: Diagnosis present

## 2021-07-12 LAB — CBC WITH DIFFERENTIAL/PLATELET
Abs Immature Granulocytes: 0.09 10*3/uL — ABNORMAL HIGH (ref 0.00–0.07)
Basophils Absolute: 0 10*3/uL (ref 0.0–0.1)
Basophils Relative: 0 %
Eosinophils Absolute: 0.1 10*3/uL (ref 0.0–0.5)
Eosinophils Relative: 0 %
HCT: 25.7 % — ABNORMAL LOW (ref 39.0–52.0)
Hemoglobin: 8.2 g/dL — ABNORMAL LOW (ref 13.0–17.0)
Immature Granulocytes: 1 %
Lymphocytes Relative: 4 %
Lymphs Abs: 0.5 10*3/uL — ABNORMAL LOW (ref 0.7–4.0)
MCH: 31.3 pg (ref 26.0–34.0)
MCHC: 31.9 g/dL (ref 30.0–36.0)
MCV: 98.1 fL (ref 80.0–100.0)
Monocytes Absolute: 0.9 10*3/uL (ref 0.1–1.0)
Monocytes Relative: 6 %
Neutro Abs: 12.3 10*3/uL — ABNORMAL HIGH (ref 1.7–7.7)
Neutrophils Relative %: 89 %
Platelets: 141 10*3/uL — ABNORMAL LOW (ref 150–400)
RBC: 2.62 MIL/uL — ABNORMAL LOW (ref 4.22–5.81)
RDW: 16.3 % — ABNORMAL HIGH (ref 11.5–15.5)
WBC: 13.9 10*3/uL — ABNORMAL HIGH (ref 4.0–10.5)
nRBC: 0 % (ref 0.0–0.2)

## 2021-07-12 LAB — COMPREHENSIVE METABOLIC PANEL
ALT: 16 U/L (ref 0–44)
AST: 16 U/L (ref 15–41)
Albumin: 3.2 g/dL — ABNORMAL LOW (ref 3.5–5.0)
Alkaline Phosphatase: 108 U/L (ref 38–126)
Anion gap: 20 — ABNORMAL HIGH (ref 5–15)
BUN: 110 mg/dL — ABNORMAL HIGH (ref 6–20)
CO2: 20 mmol/L — ABNORMAL LOW (ref 22–32)
Calcium: 9.1 mg/dL (ref 8.9–10.3)
Chloride: 102 mmol/L (ref 98–111)
Creatinine, Ser: 14.11 mg/dL — ABNORMAL HIGH (ref 0.61–1.24)
GFR, Estimated: 4 mL/min — ABNORMAL LOW (ref >60)
Glucose, Bld: 149 mg/dL — ABNORMAL HIGH (ref 70–99)
Potassium: 4.9 mmol/L (ref 3.5–5.1)
Sodium: 142 mmol/L (ref 135–145)
Total Bilirubin: 1.5 mg/dL — ABNORMAL HIGH (ref 0.3–1.2)
Total Protein: 8 g/dL (ref 6.5–8.1)

## 2021-07-12 LAB — APTT: aPTT: 33 seconds (ref 24–36)

## 2021-07-12 LAB — PROTIME-INR
INR: 1.4 — ABNORMAL HIGH (ref 0.8–1.2)
Prothrombin Time: 16.9 seconds — ABNORMAL HIGH (ref 11.4–15.2)

## 2021-07-12 LAB — LIPASE, BLOOD: Lipase: 34 U/L (ref 11–51)

## 2021-07-12 LAB — RESP PANEL BY RT-PCR (FLU A&B, COVID) ARPGX2
Influenza A by PCR: NEGATIVE
Influenza B by PCR: NEGATIVE
SARS Coronavirus 2 by RT PCR: NEGATIVE

## 2021-07-12 LAB — MAGNESIUM: Magnesium: 2.6 mg/dL — ABNORMAL HIGH (ref 1.7–2.4)

## 2021-07-12 LAB — TROPONIN I (HIGH SENSITIVITY): Troponin I (High Sensitivity): 141 ng/L (ref <18)

## 2021-07-12 IMAGING — DX DG CHEST 1V PORT
1 series · 1 of 1 positions shown · non-contrast
Comparison: [DATE] [DATE], [DATE] ([DATE] a.m.)

CLINICAL DATA: Shortness of breath x2 days.

EXAM:
PORTABLE CHEST 1 VIEW

[chest ap]
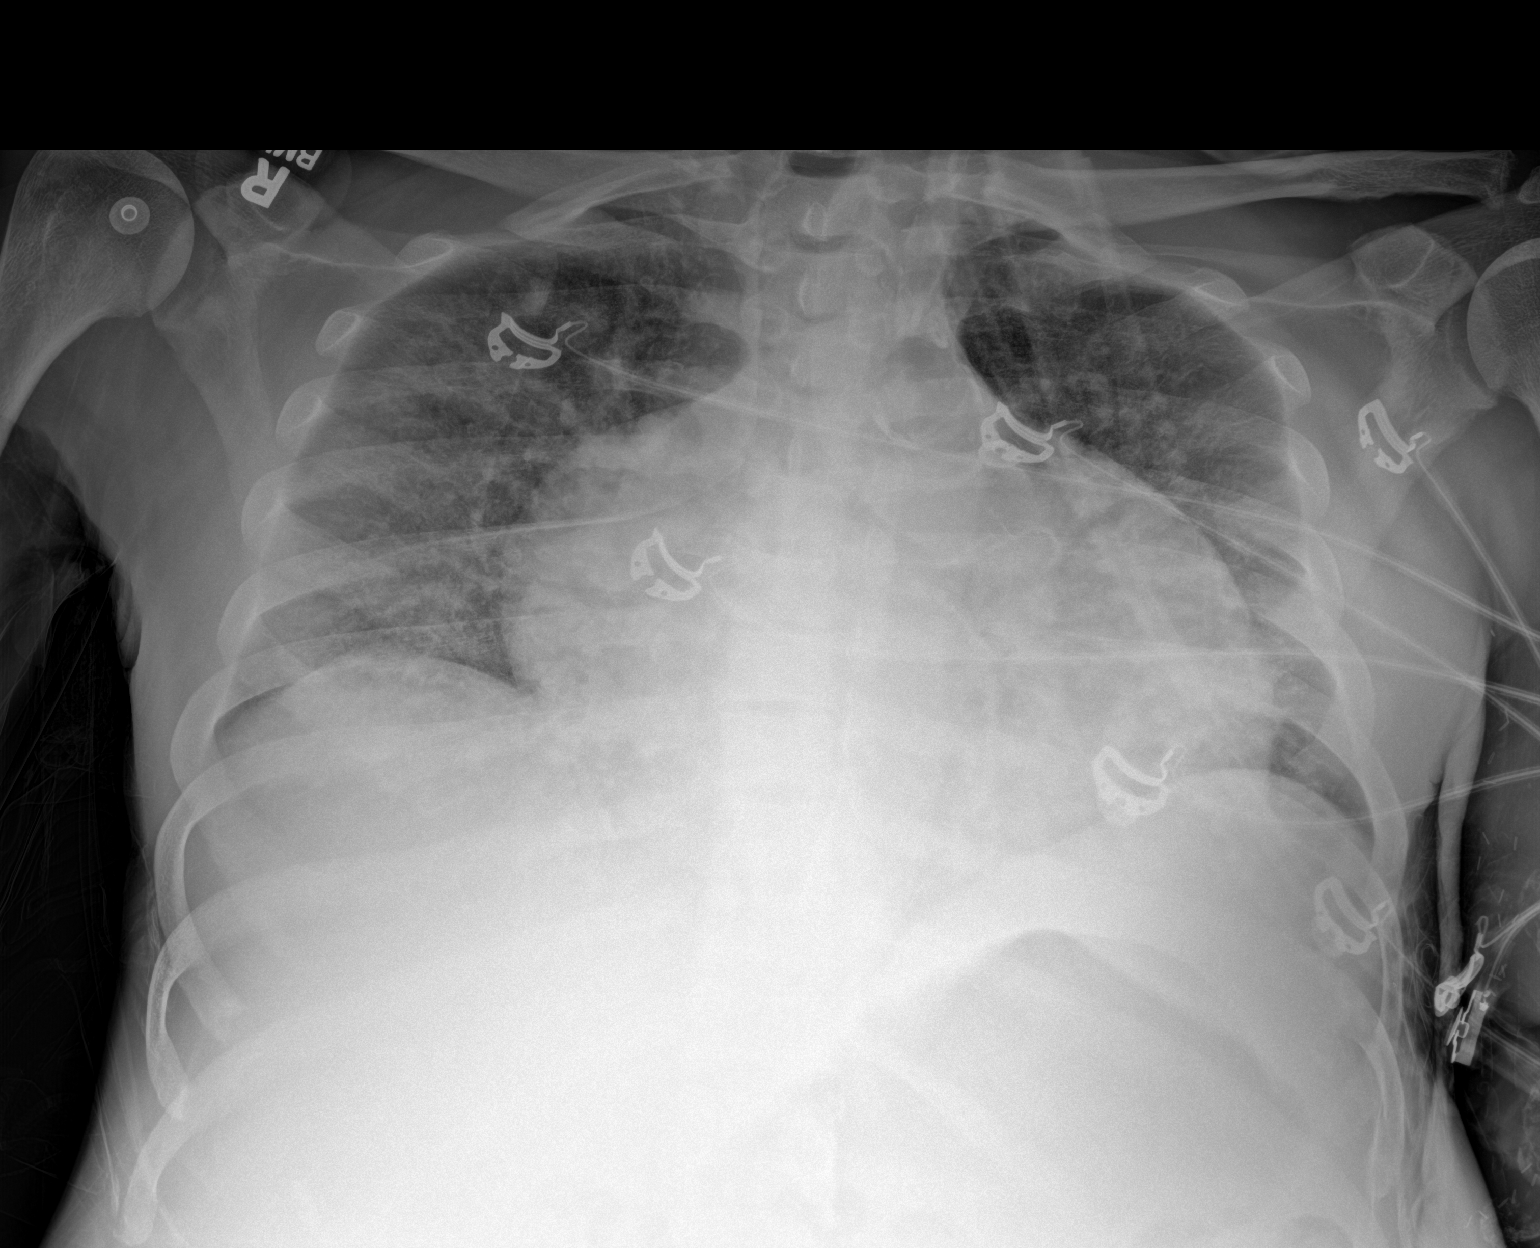

[1 of 1 positions shown; findings below may reference images not displayed]

FINDINGS: Low lung volumes are seen. Mild, diffusely increased interstitial
lung markings are noted. This is stable in appearance when compared
to the prior study. There is no evidence of a pleural effusion or
pneumothorax. The cardiac silhouette is markedly enlarged. There is
marked severity calcification of the thoracic aorta. The visualized
skeletal structures are unremarkable.
IMPRESSION: 1. Stable cardiomegaly with mild pulmonary edema.

## 2021-07-12 IMAGING — US US EXTREM UP ARTERIAL SEG MULTIPLE*L*
1 series · 14 of 22 positions shown · non-contrast
Comparison: None.

CLINICAL DATA: Left upper extremity AV fistula for hemodialysis
access. Absent rule with tenderness. Evaluate for thrombosis

EXAM:
LEFT UPPER EXTREMITY ARTERIAL DUPLEX SCAN
TECHNIQUE: Gray-scale sonography as well as color Doppler and duplex ultrasound
was performed to evaluate the hemodialysis AV fistula.

[Series 1: us upper extremity duplex left (non-wbi) · 14 of 22 slices shown]
[im 1/22]
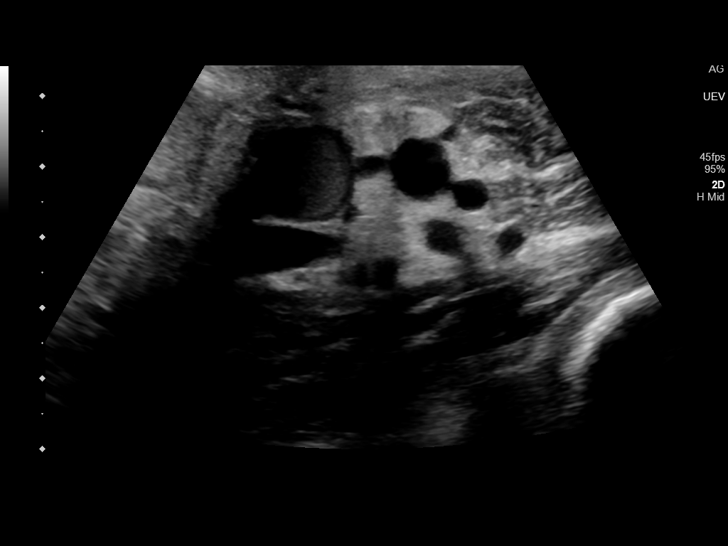
[im 3/22]
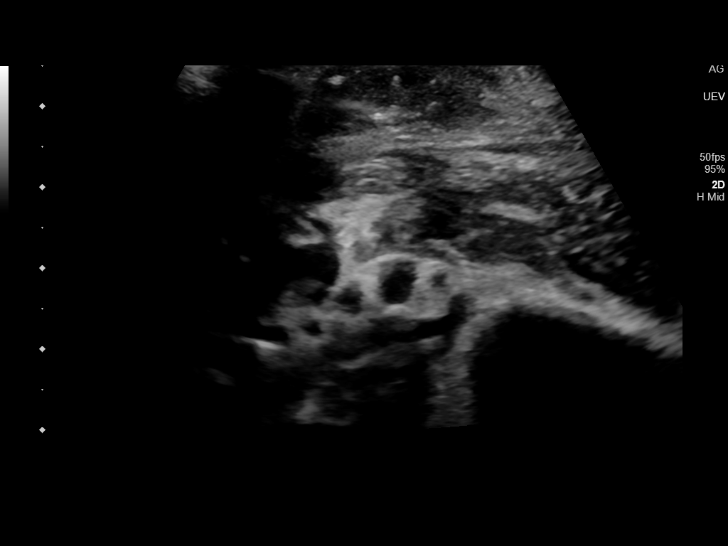
[im 4/22]
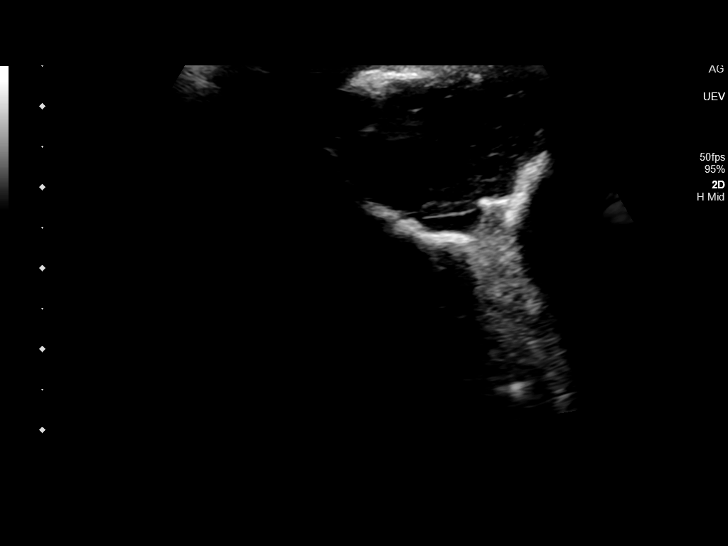
[im 6/22]
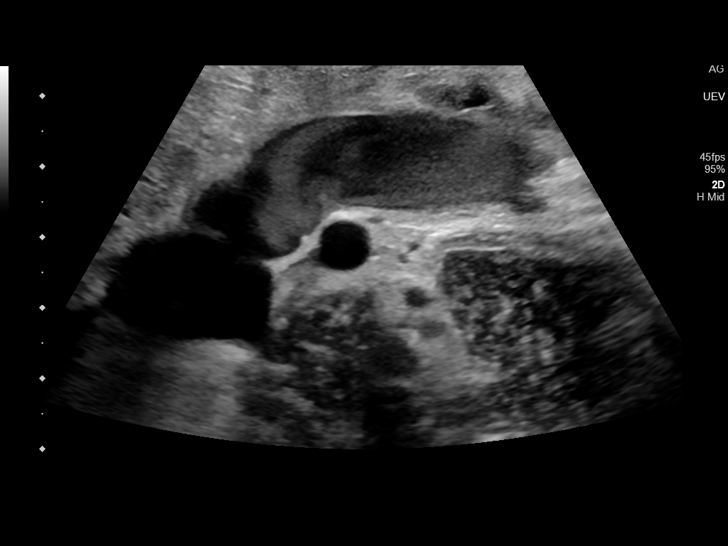
[im 8/22]
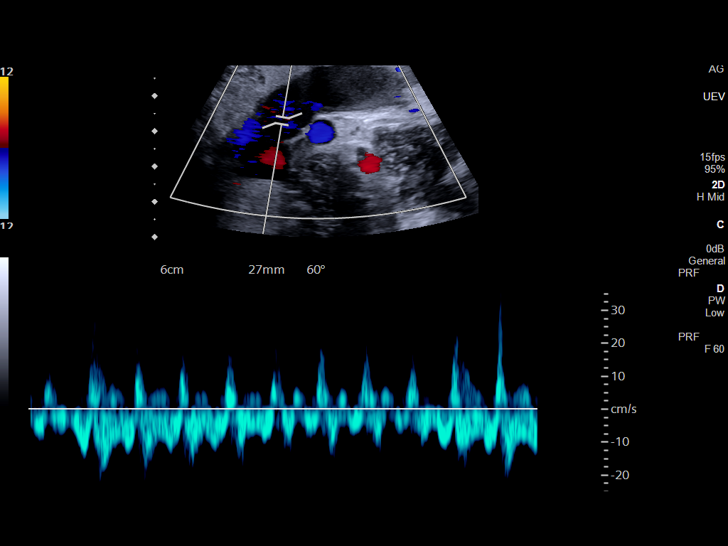
[im 9/22]
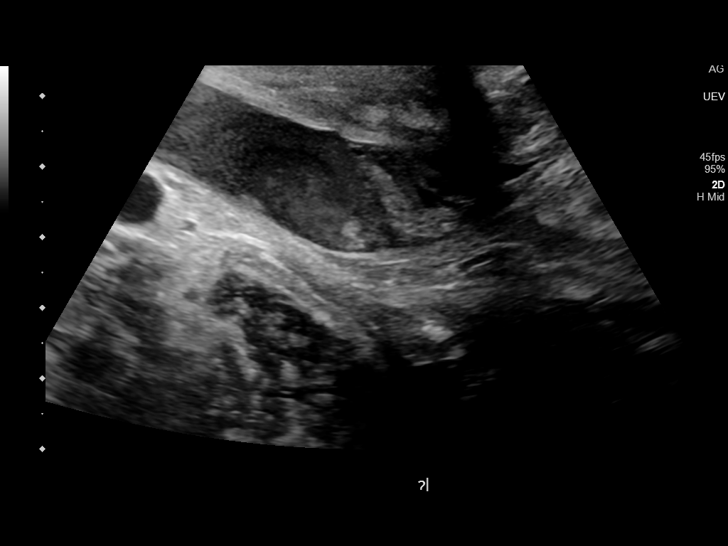
[im 11/22]
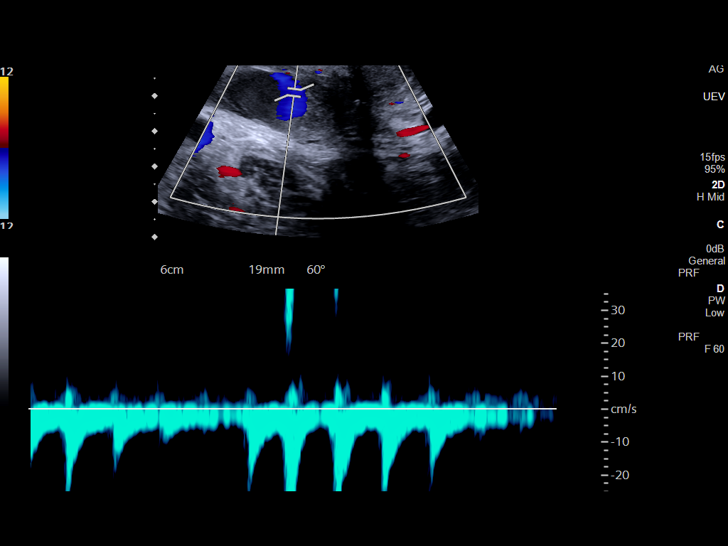
[im 12/22]
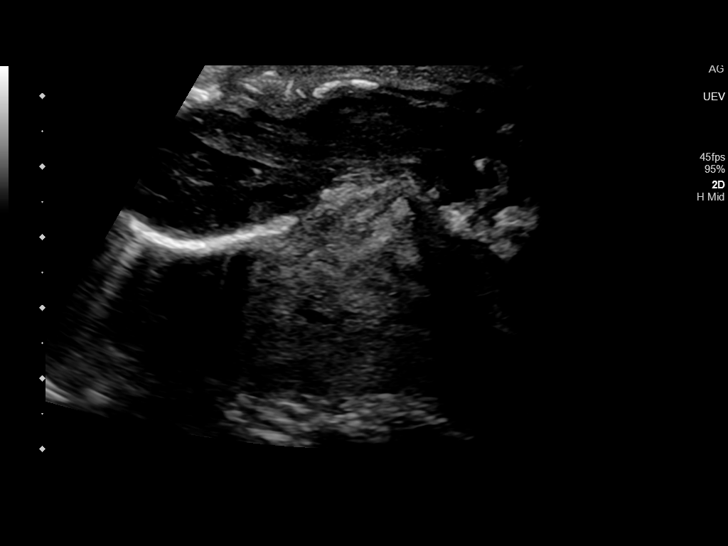
[im 14/22]
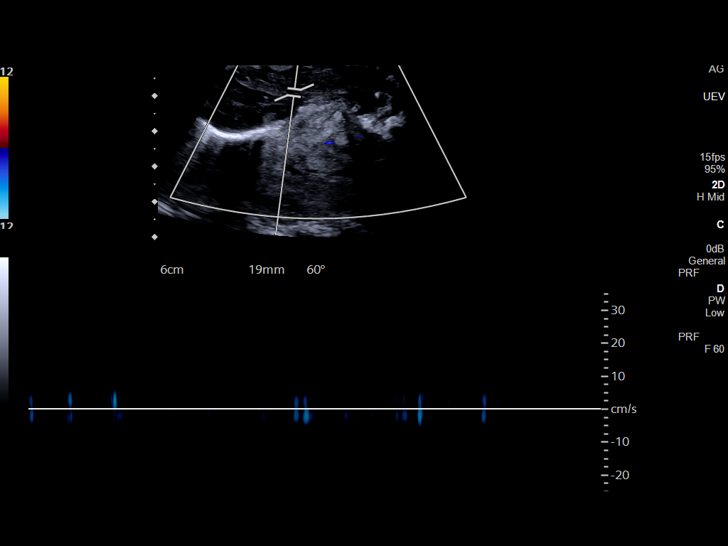
[im 15/22]
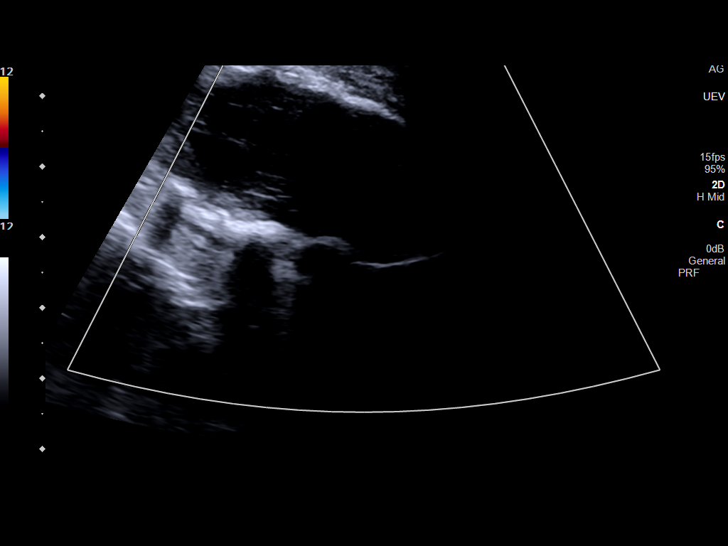
[im 17/22]
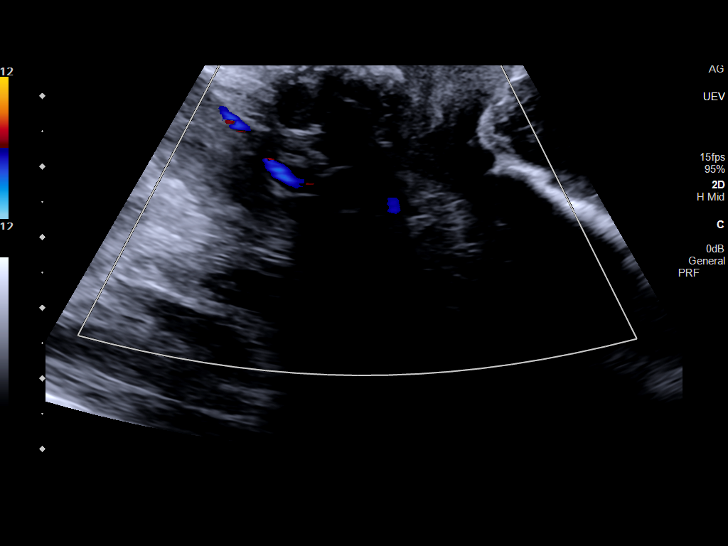
[im 19/22]
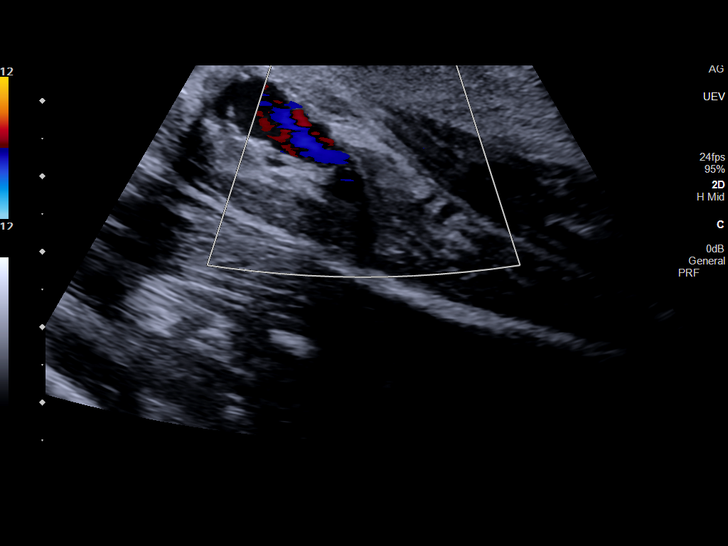
[im 20/22]
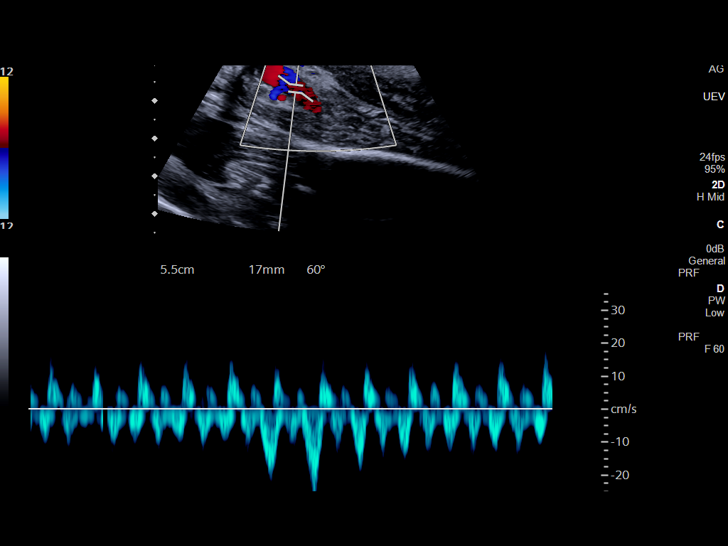
[im 22/22]
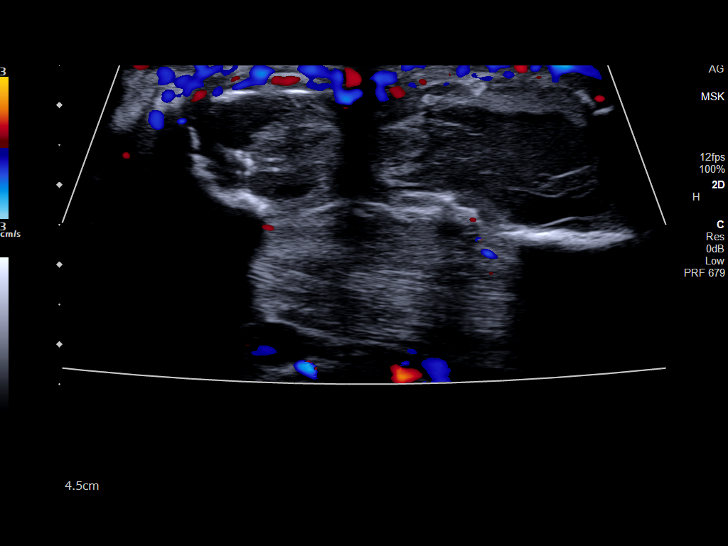

[14 of 22 positions shown; findings below may reference images not displayed]

FINDINGS: Targeted interrogation of the patient's arteriovenous fistula at the
elbow. The fistula is thrombosed with near occlusive clot. There is
no internal blood flow. No focal fluid collection is seen.

The remainder of the arteries in the left upper extremity were not
interrogated.
IMPRESSION: Thrombosed AV fistula in the left arm.

These results were called by telephone at the time of interpretation
on [DATE] at [DATE] to provider TOMANI , who verbally
acknowledged these results.

## 2021-07-12 MED ORDER — IOHEXOL 350 MG/ML SOLN: 75.0000 mL | Freq: Once | INTRAVENOUS | Status: DC | PRN

## 2021-07-12 MED ORDER — NICOTINE 21 MG/24HR TD PT24
21.0000 mg | MEDICATED_PATCH | Freq: Every day | TRANSDERMAL | Status: DC
  Administered 2021-07-13 – 2021-07-15 (×3): 21 mg via TRANSDERMAL
  Filled 2021-07-12 (×3): qty 1

## 2021-07-12 MED ORDER — DILTIAZEM HCL 25 MG/5ML IV SOLN
15.0000 mg | Freq: Once | INTRAVENOUS | Status: AC
  Administered 2021-07-12: 15 mg via INTRAVENOUS

## 2021-07-12 MED ORDER — DILTIAZEM HCL 25 MG/5ML IV SOLN
INTRAVENOUS | Status: AC
  Filled 2021-07-12: qty 5

## 2021-07-12 MED ORDER — PERMETHRIN 5 % EX CREA
TOPICAL_CREAM | Freq: Once | CUTANEOUS | Status: AC
  Administered 2021-07-13: 1 via TOPICAL
  Filled 2021-07-12: qty 60

## 2021-07-12 NOTE — ED Notes (Signed)
US at bedside

## 2021-07-12 NOTE — ED Notes (Addendum)
RT and this RN escorting pt. To CT with Bipap and cont. Cardiac monitoring.

## 2021-07-12 NOTE — ED Notes (Signed)
MD aware of pt's labile HR. Will continue to monitor.

## 2021-07-12 NOTE — H&P (Signed)
History and Physical   Jordan [name] XBJ:[id number] DOB: 1966-01-23 DOA: 07/12/2021  Referring MD/NP/PA: Dr. Joni Fears  PCP: Pcp, No   Outpatient Specialists: Nephrology  Patient coming from: Home  Chief Complaint: Shortness of breath  HPI: Jordan [name] is a 55 y.o. male with medical history significant of end-stage renal disease on hemodialysis Mondays Wednesdays Fridays, alcohol abuse, tobacco abuse, medication noncompliance, COPD, essential hypertension who apparently has been missing hemodialysis quite a lot leading to his fluid overload and being admitted to the hospital.  He did a similar thing earlier this month on October 9.  Patient is here again today after missing hemodialysis for a week.  Extremely agitated hypoxic and tachypneic.  He is currently on BiPAP.  Patient is determined to be fluid overloaded from missing hemodialysis.  Also corresponding findings suggestive of possible other causes of hypoxia.  He is therefore being admitted for emergent hemodialysis and treatment.  Patient is currently unable to give any adequate history.  He is struggling to breathe..  ED Course: Temperature 98.2, blood pressure 211/187, pulse 175, respirate 27 oxygen sat 91% on BiPAP.  White count is 13.9 hemoglobin 8.2 platelets 141.  Sodium 132 potassium 4.9 chloride 102 CO2 20 BUN 110 creatinine 14 point calcium 9.1.  Troponin 141 total bili 1.5 magnesium 2.6 gap 26.  COVID-19 and other viral screens are negative chest x-ray is negative chest x-ray showed evidence of fluid overload  Review of Systems: As per HPI otherwise 10 point review of systems negative.    Past Medical History:  Diagnosis Date   Anemia    Aortic atherosclerosis ([place]) 11/12/2019   ED (erectile dysfunction)    Emphysema of lung ([place]) 11/12/2019   ESRD on hemodialysis ([place])    M/W/F in [place], [place]   ETOH abuse    History of blood transfusion    Hypertension    Wears dentures     Past Surgical History:   Procedure Laterality Date   BASCILIC VEIN TRANSPOSITION Left 08/07/2016   Procedure: LEFT BASILIC VEIN TRANSPOSITION;  Surgeon: Angelia Mould, MD;  Location: [facility];  Service: Vascular;  Laterality: Left;   CLOSED REDUCTION NASAL FRACTURE N/A 08/11/2019   Procedure: CLOSED REDUCTION NASAL FRACTURE WITH STABILIZATION;  Surgeon: Irene Limbo, MD;  Location: [facility];  Service: Plastics;  Laterality: N/A;   COLONOSCOPY N/A 08/12/2016   Procedure: COLONOSCOPY;  Surgeon: Otis Brace, MD;  Location: [facility];  Service: Gastroenterology;  Laterality: N/A;   COLONOSCOPY WITH PROPOFOL N/A 06/05/2021   Procedure: COLONOSCOPY WITH PROPOFOL;  Surgeon: Sharyn Creamer, MD;  Location: [facility];  Service: Gastroenterology;  Laterality: N/A;   ESOPHAGOGASTRODUODENOSCOPY N/A 08/12/2016   Procedure: ESOPHAGOGASTRODUODENOSCOPY (EGD);  Surgeon: Otis Brace, MD;  Location: [facility];  Service: Gastroenterology;  Laterality: N/A;   ESOPHAGOGASTRODUODENOSCOPY (EGD) WITH PROPOFOL N/A 06/05/2021   Procedure: ESOPHAGOGASTRODUODENOSCOPY (EGD) WITH PROPOFOL;  Surgeon: Sharyn Creamer, MD;  Location: [facility];  Service: Gastroenterology;  Laterality: N/A;   INSERTION OF DIALYSIS CATHETER N/A 08/07/2016   Procedure: INSERTION OF TUNNELED DIALYSIS CATHETER;  Surgeon: Angelia Mould, MD;  Location: [facility];  Service: Vascular;  Laterality: N/A;   LIGATION OF ARTERIOVENOUS  FISTULA Left 09/12/2016   Procedure: BANDING OF LEFT  ARTERIOVENOUS  FISTULA;  Surgeon: Angelia Mould, MD;  Location: [facility];  Service: Vascular;  Laterality: Left;   LOWER EXTREMITY INTERVENTION Right 12/02/2018   Procedure: LOWER EXTREMITY INTERVENTION;  Surgeon: Algernon Huxley, MD;  Location: [facility];  Service: Cardiovascular;  Laterality: Right;   POLYPECTOMY  06/05/2021   Procedure: POLYPECTOMY;  Surgeon: Sharyn Creamer, MD;  Location: [facility];  Service: Gastroenterology;;     reports that he  has been smoking cigarettes. He has a 20.00 pack-year smoking history. He has never used smokeless tobacco. He reports that he does not currently use alcohol after a past usage of about 21.0 standard drinks per week. He reports that he does not use drugs.  Allergies  Allergen Reactions   Lisinopril Swelling    Family History  Problem Relation Age of Onset   Diabetes Mother    Kidney failure Mother    Healthy Father    Kidney failure Brother    Healthy Sister    Kidney disease Daughter    Post-traumatic stress disorder Neg Hx    Bladder Cancer Neg Hx    Kidney cancer Neg Hx      Prior to Admission medications   Medication Sig Start Date End Date Taking? Authorizing Provider  albuterol (VENTOLIN HFA) 108 (90 Base) MCG/ACT inhaler Inhale 2 puffs into the lungs every 4 (four) hours as needed for wheezing or shortness of breath. 06/05/21   Danford, Suann Larry, MD  calcium acetate (PHOSLO) 667 MG capsule Take 2 capsules (1,334 mg total) by mouth 3 (three) times daily with meals. 06/05/21   Danford, Suann Larry, MD  cinacalcet (SENSIPAR) 30 MG tablet Take 1 tablet (30 mg total) by mouth daily with breakfast. 06/05/21   Danford, Suann Larry, MD  docusate sodium (COLACE) 100 MG capsule Take 1 capsule (100 mg total) by mouth 2 (two) times daily. 06/05/21   Edwin Dada, MD  epoetin alfa (EPOGEN) 4000 UNIT/ML injection Inject 1 mL (4,000 Units total) into the vein every Monday, Wednesday, and Friday with hemodialysis. 06/22/20   Enzo Bi, MD  ferrous sulfate 325 (65 FE) MG tablet Take 1 tablet (325 mg total) by mouth 2 (two) times daily with a meal. 06/05/21   Danford, Suann Larry, MD  metoprolol tartrate (LOPRESSOR) 50 MG tablet Take 1 tablet (50 mg total) by mouth 2 (two) times daily. 07/04/21   Jennye Boroughs, MD  pantoprazole (PROTONIX) 20 MG tablet Take 1 tablet (20 mg total) by mouth 2 (two) times daily before a meal. 06/05/21 08/04/21  Edwin Dada, MD    Physical  Exam: Vitals:   07/12/21 2218 07/12/21 2225 07/12/21 2230 07/12/21 2245  BP:      Pulse:   (!) 169   Resp: (!) 24 (!) 27 (!) 24 (!) 26  Temp:      TempSrc:      SpO2:   100%   Weight:      Height:          Constitutional: Tachypneic, acute respiratory distress Vitals:   07/12/21 2218 07/12/21 2225 07/12/21 2230 07/12/21 2245  BP:      Pulse:   (!) 169   Resp: (!) 24 (!) 27 (!) 24 (!) 26  Temp:      TempSrc:      SpO2:   100%   Weight:      Height:       Eyes: PERRL, lids and conjunctivae normal ENMT: Mucous membranes are dry. Posterior pharynx clear of any exudate or lesions.Normal dentition.  Neck: normal, supple, no masses, no thyromegaly Respiratory: Decreased air entry bilaterally with tachypnea, increased respiratory drive. Cardiovascular: Tachycardia with irregular rhythm no murmurs / rubs / gallops.  1+ extremity edema. 2+  pedal pulses. No carotid bruits.  Abdomen: no tenderness, no masses palpated. No hepatosplenomegaly. Bowel sounds positive.  Musculoskeletal: no clubbing / cyanosis. No joint deformity upper and lower extremities. Good ROM, no contractures. Normal muscle tone.  Skin: Skin lesions worrisome for possible scabies no rashes, lesions, ulcers. No induration Neurologic: CN 2-12 grossly intact. Sensation intact, DTR normal. Strength 5/5 in all 4.  Psychiatric: Normal judgment and insight. Alert and oriented x 3.  Anxious mood.     Labs on Admission: I have personally reviewed following labs and imaging studies  CBC: Recent Labs  Lab 07/12/21 1851  WBC 13.9*  NEUTROABS 12.3*  HGB 8.2*  HCT 25.7*  MCV 98.1  PLT 427*   Basic Metabolic Panel: Recent Labs  Lab 07/12/21 1851  NA 142  K 4.9  CL 102  CO2 20*  GLUCOSE 149*  BUN 110*  CREATININE 14.11*  CALCIUM 9.1  MG 2.6*   GFR: Estimated Creatinine Clearance: 6.1 mL/min (A) (by C-G formula based on SCr of 14.11 mg/dL (H)). Liver Function Tests: Recent Labs  Lab 07/12/21 1851  AST 16   ALT 16  ALKPHOS 108  BILITOT 1.5*  PROT 8.0  ALBUMIN 3.2*   Recent Labs  Lab 07/12/21 1851  LIPASE 34   No results for input(s): AMMONIA in the last 168 hours. Coagulation Profile: Recent Labs  Lab 07/12/21 1851  INR 1.4*   Cardiac Enzymes: No results for input(s): CKTOTAL, CKMB, CKMBINDEX, TROPONINI in the last 168 hours. BNP (last 3 results) No results for input(s): PROBNP in the last 8760 hours. HbA1C: No results for input(s): HGBA1C in the last 72 hours. CBG: No results for input(s): GLUCAP in the last 168 hours. Lipid Profile: No results for input(s): CHOL, HDL, LDLCALC, TRIG, CHOLHDL, LDLDIRECT in the last 72 hours. Thyroid Function Tests: No results for input(s): TSH, T4TOTAL, FREET4, T3FREE, THYROIDAB in the last 72 hours. Anemia Panel: No results for input(s): VITAMINB12, FOLATE, FERRITIN, TIBC, IRON, RETICCTPCT in the last 72 hours. Urine analysis:    Component Value Date/Time   COLORURINE YELLOW (A) 01/13/2021 0153   APPEARANCEUR CLEAR (A) 01/13/2021 0153   LABSPEC 1.012 01/13/2021 0153   PHURINE 9.0 (H) 01/13/2021 0153   GLUCOSEU 150 (A) 01/13/2021 0153   HGBUR NEGATIVE 01/13/2021 0153   BILIRUBINUR NEGATIVE 01/13/2021 0153   KETONESUR NEGATIVE 01/13/2021 0153   PROTEINUR >=300 (A) 01/13/2021 0153   NITRITE NEGATIVE 01/13/2021 0153   LEUKOCYTESUR NEGATIVE 01/13/2021 0153   Sepsis Labs: @LABRCNTIP (procalcitonin:4,lacticidven:4) ) Recent Results (from the past 240 hour(s))  Body fluid culture w Gram Stain     Status: None   Collection Time: 07/04/21  4:07 PM   Specimen: PATH Cytology Peritoneal fluid  Result Value Ref Range Status   Specimen Description   Final    PERITONEAL Performed at [facility], [address] [place], [place] [address]    Special Requests   Final    PERITONEAL Performed at [facility], [address] [place], [place] [address]    Gram Stain NO WBC SEEN NO ORGANISMS SEEN   Final   Culture    Final    NO GROWTH 3 DAYS Performed at [facility], [place] [address], [place], [place] [address]    Report Status 07/08/2021 FINAL  Final  Resp Panel by RT-PCR (Flu A&B, Covid) Nasopharyngeal Swab     Status: None   Collection Time: 07/12/21  6:51 PM   Specimen: Nasopharyngeal Swab; Nasopharyngeal(NP) swabs in vial  transport medium  Result Value Ref Range Status   SARS Coronavirus 2 by RT PCR NEGATIVE NEGATIVE Final    Comment: (NOTE) SARS-CoV-2 target nucleic acids are NOT DETECTED.  The SARS-CoV-2 RNA is generally detectable in upper respiratory specimens during the acute phase of infection. The lowest concentration of SARS-CoV-2 viral copies this assay can detect is 138 copies/mL. A negative result does not preclude SARS-Cov-2 infection and should not be used as the sole basis for treatment or other patient management decisions. A negative result may occur with  improper specimen collection/handling, submission of specimen other than nasopharyngeal swab, presence of viral mutation(s) within the areas targeted by this assay, and inadequate number of viral copies(<138 copies/mL). A negative result must be combined with clinical observations, patient history, and epidemiological information. The expected result is Negative.  Fact Sheet for Patients:  EntrepreneurPulse.com.au  Fact Sheet for Healthcare Providers:  IncredibleEmployment.be  This test is no t yet approved or cleared by the [place] FDA and  has been authorized for detection and/or diagnosis of SARS-CoV-2 by FDA under an Emergency Use Authorization (EUA). This EUA will remain  in effect (meaning this test can be used) for the duration of the COVID-19 declaration under Section 564(b)(1) of the Act, 21 U.S.C.section 360bbb-3(b)(1), unless the authorization is terminated  or revoked sooner.       Influenza A by PCR NEGATIVE NEGATIVE Final   Influenza B by PCR NEGATIVE  NEGATIVE Final    Comment: (NOTE) The Xpert Xpress SARS-CoV-2/FLU/RSV plus assay is intended as an aid in the diagnosis of influenza from Nasopharyngeal swab specimens and should not be used as a sole basis for treatment. Nasal washings and aspirates are unacceptable for Xpert Xpress SARS-CoV-2/FLU/RSV testing.  Fact Sheet for Patients: EntrepreneurPulse.com.au  Fact Sheet for Healthcare Providers: IncredibleEmployment.be  This test is not yet approved or cleared by the [place] FDA and has been authorized for detection and/or diagnosis of SARS-CoV-2 by FDA under an Emergency Use Authorization (EUA). This EUA will remain in effect (meaning this test can be used) for the duration of the COVID-19 declaration under Section 564(b)(1) of the Act, 21 U.S.C. section 360bbb-3(b)(1), unless the authorization is terminated or revoked.  Performed at [facility], [address], [place], [place] [address]      Radiological Exams on Admission: [place] Upper Ext Art Left  Result Date: 07/12/2021 CLINICAL DATA:  Left upper extremity AV fistula for hemodialysis access. Absent rule with tenderness. Evaluate for thrombosis EXAM: LEFT UPPER EXTREMITY ARTERIAL DUPLEX SCAN TECHNIQUE: Gray-scale sonography as well as color Doppler and duplex ultrasound was performed to evaluate the hemodialysis AV fistula. COMPARISON:  None. FINDINGS: Targeted interrogation of the patient's arteriovenous fistula at the elbow. The fistula is thrombosed with near occlusive clot. There is no internal blood flow. No focal fluid collection is seen. The remainder of the arteries in the left upper extremity were not interrogated. IMPRESSION: Thrombosed AV fistula in the left arm. These results were called by telephone at the time of interpretation on 07/12/2021 at 7:40 pm to provider PHILLIP STAFFORD , who verbally acknowledged these results. Electronically Signed   By: Keith Rake M.D.   On: 07/12/2021 19:41   DG Chest Portable 1 View  Result Date: 07/12/2021 CLINICAL DATA:  Shortness of breath x2 days. EXAM: PORTABLE CHEST 1 VIEW COMPARISON:  July 02, 2021 (8:32 a.m.) FINDINGS: Low lung volumes are seen. Mild, diffusely increased interstitial lung markings are noted. This is stable in appearance when  compared to the prior study. There is no evidence of a pleural effusion or pneumothorax. The cardiac silhouette is markedly enlarged. There is marked severity calcification of the thoracic aorta. The visualized skeletal structures are unremarkable. IMPRESSION: 1. Stable cardiomegaly with mild pulmonary edema. Electronically Signed   By: Virgina Norfolk M.D.   On: 07/12/2021 19:20    EKG: Independently reviewed.  Sinus tachycardia  Assessment/Plan Principal Problem:   Acute on chronic respiratory failure with hypoxemia (HCC) Active Problems:   Tobacco abuse   ESRD on dialysis ([place])   Essential hypertension   Anemia in ESRD (end-stage renal disease) (HCC)   Moderate protein-calorie malnutrition (HCC)   Acute on chronic combined systolic and diastolic CHF (congestive heart failure) (HCC)   PAF (paroxysmal atrial fibrillation) (HCC)   Alcohol abuse   COPD (chronic obstructive pulmonary disease) ([place])     #1 acute on chronic respiratory failure with hypoxemia: Secondary to missing hemodialysis.  Patient currently on BiPAP.  We will continue respiratory support until he is hemodialyzed emergently.  Nephrology consulted by ER.  Also worrisome for possible PE as the degree of lung findings does not correlate with his hypoxia.  #2 end-stage renal disease: Again he gets hemodialyzed Mondays Wednesdays Fridays.  Patient to get dialysis today.  #3 paroxysmal atrial fibrillation: Currently in RVR.  Must of been triggered by his respiratory distress.  Continue to monitor.  #4 essential hypertension: Continue current blood pressure medications.  #5 COPD: No acute  exacerbation.  #6 history of alcohol abuse: Denied recent alcohol use.  Watch for DTs.  #7 tobacco abuse: Add nicotine patch  #8 skin rashes on lesions: Suspicious for scabies.  Permethrin has been ordered.  We will follow   DVT prophylaxis: Heparin Code Status: Partial code Family Communication: No family at bedside Disposition Plan: Home Consults called: Nephrology Admission status: Inpatient  Severity of Illness: The appropriate patient status for this patient is INPATIENT. Inpatient status is judged to be reasonable and necessary in order to provide the required intensity of service to ensure the patient's safety. The patient's presenting symptoms, physical exam findings, and initial radiographic and laboratory data in the context of their chronic comorbidities is felt to place them at high risk for further clinical deterioration. Furthermore, it is not anticipated that the patient will be medically stable for discharge from the hospital within 2 midnights of admission.   * I certify that at the point of admission it is my clinical judgment that the patient will require inpatient hospital care spanning beyond 2 midnights from the point of admission due to high intensity of service, high risk for further deterioration and high frequency of surveillance required.Barbette Merino MD [facility] Pager [phone]  If 7PM-7AM, please contact night-coverage [email or web address] Password [email or web address]  07/12/2021, 11:22 PM

## 2021-07-12 NOTE — ED Triage Notes (Signed)
C/o SOB x 2 days. Pt. States he has missed 3 dialysis treatments. Was seen in ED approx 2 weeks pta for similar.

## 2021-07-12 NOTE — ED Notes (Signed)
With pt's permission, This RN called Bari Mantis, [phone] to update her on pt. Condition and POC.

## 2021-07-12 NOTE — ED Notes (Signed)
Pt. Refusing to keep bipap on. States he can breath on his own. Pt. Yelling at staff, "I wont put that thing back on!" This RN attempted to explain importance of bipap to pt's breathing etc, but pt. Yelling over this RN's explanation. MD notfied. Will continue to monitor.

## 2021-07-12 NOTE — ED Notes (Signed)
MD at bedside for US IV start

## 2021-07-12 NOTE — ED Notes (Signed)
While in CT, pt's RUE IV discovered to be infiltrated. Pt. Transported back to ED rm. 3, Dr. Joni Fears notified. [place] and IV supplies at bedside for Dr. Joni Fears.

## 2021-07-12 NOTE — ED Notes (Signed)
Critical troponin:141 MD notified.

## 2021-07-12 NOTE — ED Provider Notes (Addendum)
[facility] Emergency Department Provider Note  ____________________________________________  Time seen: Approximately 6:48 PM  I have reviewed the triage vital signs and the nursing notes.   HISTORY  Chief Complaint Shortness of Breath (C/o SOB x 2 days. Pt. States he has missed 3 dialysis treatments. Was seen in ED approx 2 weeks pta for similar.)    HPI Jordan [name] is a 55 y.o. male with a history of ESRD on hemodialysis Monday Wednesday Friday, alcohol abuse, hypertension, COPD who comes ED complaining of shortness of breath for the past 2 days, constant, gradually worsening, severe.  EMS report that his oxygen saturation was 74% on room air, improved to 90% with 15 L nonrebreather oxygen.  Patient denies pain.  He does state that he has missed dialysis for the past week, unable to state why.  He also complains of pain in his left arm at his AV fistula that started yesterday.  He feels like the area is getting swollen.  When asked if the dialysis center has trouble accessing his fistula when he does go, he says they have not had any problem with fistula function.    Past Medical History:  Diagnosis Date   Anemia    Aortic atherosclerosis ([place]) 11/12/2019   ED (erectile dysfunction)    Emphysema of lung ([place]) 11/12/2019   ESRD on hemodialysis ([place])    M/W/F in [place], [place]   ETOH abuse    History of blood transfusion    Hypertension    Wears dentures      Patient Active Problem List   Diagnosis Date Noted   Ascites    Protein-calorie malnutrition, severe 06/05/2021   Hypervolemia associated with renal insufficiency 06/02/2021   Symptomatic anemia 05/28/2021   Acute on chronic diastolic CHF (congestive heart failure) ([place]) 05/28/2021   Hyperkalemia 05/28/2021   Pulmonary edema 04/29/2021   Pruritus 04/29/2021   COVID-19 virus infection 04/29/2021   Thrombocytopenia ([place]) 04/08/2021   COPD (chronic obstructive pulmonary disease) ([place])  04/08/2021   CHF (congestive heart failure) ([place]) 03/25/2021   PAF (paroxysmal atrial fibrillation) ([place]) 03/20/2021   Chronic diastolic CHF (congestive heart failure) ([place]) 03/20/2021   Alcohol abuse 03/20/2021   Vision loss of left eye 03/20/2021   SOB (shortness of breath) 03/20/2021   Shortness of breath 03/10/2021   Abscess of buttock 03/02/2021   Fluid overload 03/02/2021   Multifocal pneumonia 01/13/2021   Current mild episode of major depressive disorder without prior episode ([place]) 04/09/2020   Atrial fibrillation with RVR ([place]) 04/04/2020   Non-compliance with renal dialysis ([place]) 03/21/2020   Acute CHF (congestive heart failure) ([place]) 03/12/2020   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 ([place]) 02/03/2020   Acute on chronic combined systolic and diastolic CHF (congestive heart failure) ([place]) 02/03/2020   Acute on chronic respiratory failure with hypoxia ([place]) 11/12/2019   Aortic atherosclerosis ([place]) 11/12/2019   Emphysema of lung ([place]) 11/12/2019   Volume overload 10/05/2019   Elevated troponin 05/17/2018   Anemia in ESRD (end-stage renal disease) ([place]) 05/17/2018   ESRD on dialysis ([place]) 05/26/2017   Essential hypertension 05/26/2017   Moderate protein-calorie malnutrition ([place]) 08/13/2016   Secondary hyperparathyroidism of renal origin ([place]) 08/13/2016   Tobacco abuse 08/04/2016     Past Surgical History:  Procedure Laterality Date   BASCILIC VEIN TRANSPOSITION Left 08/07/2016   Procedure: LEFT BASILIC VEIN TRANSPOSITION;  Surgeon: Angelia Mould, MD;  Location: [place];  Service: Vascular;  Laterality: Left;   CLOSED  REDUCTION NASAL FRACTURE N/A 08/11/2019   Procedure: CLOSED REDUCTION NASAL FRACTURE WITH STABILIZATION;  Surgeon: Irene Limbo, MD;  Location: [place];  Service: Plastics;  Laterality: N/A;   COLONOSCOPY N/A 08/12/2016   Procedure: COLONOSCOPY;  Surgeon: Otis Brace, MD;  Location: [place];  Service: Gastroenterology;   Laterality: N/A;   COLONOSCOPY WITH PROPOFOL N/A 06/05/2021   Procedure: COLONOSCOPY WITH PROPOFOL;  Surgeon: Sharyn Creamer, MD;  Location: [place];  Service: Gastroenterology;  Laterality: N/A;   ESOPHAGOGASTRODUODENOSCOPY N/A 08/12/2016   Procedure: ESOPHAGOGASTRODUODENOSCOPY (EGD);  Surgeon: Otis Brace, MD;  Location: [place];  Service: Gastroenterology;  Laterality: N/A;   ESOPHAGOGASTRODUODENOSCOPY (EGD) WITH PROPOFOL N/A 06/05/2021   Procedure: ESOPHAGOGASTRODUODENOSCOPY (EGD) WITH PROPOFOL;  Surgeon: Sharyn Creamer, MD;  Location: [place];  Service: Gastroenterology;  Laterality: N/A;   INSERTION OF DIALYSIS CATHETER N/A 08/07/2016   Procedure: INSERTION OF TUNNELED DIALYSIS CATHETER;  Surgeon: Angelia Mould, MD;  Location: [place];  Service: Vascular;  Laterality: N/A;   LIGATION OF ARTERIOVENOUS  FISTULA Left 09/12/2016   Procedure: BANDING OF LEFT  ARTERIOVENOUS  FISTULA;  Surgeon: Angelia Mould, MD;  Location: [place];  Service: Vascular;  Laterality: Left;   LOWER EXTREMITY INTERVENTION Right 12/02/2018   Procedure: LOWER EXTREMITY INTERVENTION;  Surgeon: Algernon Huxley, MD;  Location: [place] [facility];  Service: Cardiovascular;  Laterality: Right;   POLYPECTOMY  06/05/2021   Procedure: POLYPECTOMY;  Surgeon: Sharyn Creamer, MD;  Location: [facility] ENDOSCOPY;  Service: Gastroenterology;;     Prior to Admission medications   Medication Sig Start Date End Date Taking? Authorizing Provider  albuterol (VENTOLIN HFA) 108 (90 Base) MCG/ACT inhaler Inhale 2 puffs into the lungs every 4 (four) hours as needed for wheezing or shortness of breath. 06/05/21   Danford, Suann Larry, MD  calcium acetate (PHOSLO) 667 MG capsule Take 2 capsules (1,334 mg total) by mouth 3 (three) times daily with meals. 06/05/21   Danford, Suann Larry, MD  cinacalcet (SENSIPAR) 30 MG tablet Take 1 tablet (30 mg total) by mouth daily with breakfast. 06/05/21   Danford, Suann Larry, MD   docusate sodium (COLACE) 100 MG capsule Take 1 capsule (100 mg total) by mouth 2 (two) times daily. 06/05/21   Edwin Dada, MD  epoetin alfa (EPOGEN) 4000 UNIT/ML injection Inject 1 mL (4,000 Units total) into the vein every Monday, Wednesday, and Friday with hemodialysis. 06/22/20   Enzo Bi, MD  ferrous sulfate 325 (65 FE) MG tablet Take 1 tablet (325 mg total) by mouth 2 (two) times daily with a meal. 06/05/21   Danford, Suann Larry, MD  metoprolol tartrate (LOPRESSOR) 50 MG tablet Take 1 tablet (50 mg total) by mouth 2 (two) times daily. 07/04/21   Jennye Boroughs, MD  pantoprazole (PROTONIX) 20 MG tablet Take 1 tablet (20 mg total) by mouth 2 (two) times daily before a meal. 06/05/21 08/04/21  Danford, Suann Larry, MD     Allergies Lisinopril   Family History  Problem Relation Age of Onset   Diabetes Mother    Kidney failure Mother    Healthy Father    Kidney failure Brother    Healthy Sister    Kidney disease Daughter    Post-traumatic stress disorder Neg Hx    Bladder Cancer Neg Hx    Kidney cancer Neg Hx     Social History Social History   Tobacco Use   Smoking status: Every Day    Packs/day: 0.50    Years:  40.00    Pack years: 20.00    Types: Cigarettes   Smokeless tobacco: Never  Vaping Use   Vaping Use: Never used  Substance Use Topics   Alcohol use: Not Currently    Alcohol/week: 21.0 standard drinks    Types: 21 Cans of beer per week    Comment: last drink 6 months ago   Drug use: No    Review of Systems  Constitutional:   No fever or chills.  ENT:   No sore throat. No rhinorrhea. Cardiovascular:   No chest pain or syncope. Respiratory: Positive shortness of breath without cough Gastrointestinal:   Positive abdominal distention Musculoskeletal: Positive bilateral lower leg swelling.  All other systems reviewed and are negative except as documented above in ROS and HPI.  ____________________________________________   PHYSICAL  EXAM:  VITAL SIGNS: ED Triage Vitals [07/12/21 1845]  Enc Vitals Group     BP      Pulse Rate (!) 170     Resp (!) 23     Temp 98.2 F (36.8 C)     Temp Source Axillary     SpO2 100 %     Weight      Height      Head Circumference      Peak Flow      Pain Score      Pain Loc      Pain Edu?      Excl. in [place]?     Vital signs reviewed, nursing assessments reviewed.   Constitutional:   Alert and oriented.  Respiratory distress Eyes:   Conjunctivae are normal. EOMI. PERRL. ENT      Head:   Normocephalic and atraumatic.      Nose:   Normal.      Mouth/Throat:   Dry mucous membranes      Neck:   No meningismus. Full ROM.  Elevated JVP.  No subcutaneous emphysema.  Trachea midline Hematological/Lymphatic/Immunilogical:   No cervical lymphadenopathy. Cardiovascular:   Tachycardia heart rate 170. Symmetric bilateral radial and DP pulses.  No murmurs. Cap refill less than 2 seconds. Respiratory:   Respiratory distress with tachypnea, accessory muscle use, diminished breath sounds bilateral bases.  Symmetric air movement.  No crackles or rhonchi. Gastrointestinal:   Soft and nontender.  Moderately distended. There is no CVA tenderness.  No rebound, rigidity, or guarding. Genitourinary:   deferred Musculoskeletal:   Normal range of motion in all extremities. No joint effusions.  No lower extremity tenderness.  2+ pitting edema bilateral lower extremities, symmetric. Neurologic:   Normal speech and language.  Motor grossly intact. No acute focal neurologic deficits are appreciated.  Skin:    Skin is warm, dry and intact. No rash noted.  No petechiae, purpura, or bullae.  ____________________________________________    LABS (pertinent positives/negatives) (all labs ordered are listed, but only abnormal results are displayed) Labs Reviewed  COMPREHENSIVE METABOLIC PANEL - Abnormal; Notable for the following components:      Result Value   CO2 20 (*)    Glucose, Bld 149 (*)     BUN 110 (*)    Creatinine, Ser 14.11 (*)    Albumin 3.2 (*)    Total Bilirubin 1.5 (*)    GFR, Estimated 4 (*)    Anion gap 20 (*)    All other components within normal limits  CBC WITH DIFFERENTIAL/PLATELET - Abnormal; Notable for the following components:   WBC 13.9 (*)    RBC 2.62 (*)    Hemoglobin  8.2 (*)    HCT 25.7 (*)    RDW 16.3 (*)    Platelets 141 (*)    Neutro Abs 12.3 (*)    Lymphs Abs 0.5 (*)    Abs Immature Granulocytes 0.09 (*)    All other components within normal limits  PROTIME-INR - Abnormal; Notable for the following components:   Prothrombin Time 16.9 (*)    INR 1.4 (*)    All other components within normal limits  MAGNESIUM - Abnormal; Notable for the following components:   Magnesium 2.6 (*)    All other components within normal limits  TROPONIN I (HIGH SENSITIVITY) - Abnormal; Notable for the following components:   Troponin I (High Sensitivity) 141 (*)    All other components within normal limits  RESP PANEL BY RT-PCR (FLU A&B, COVID) ARPGX2  LIPASE, BLOOD  APTT  TROPONIN I (HIGH SENSITIVITY)   ____________________________________________   EKG  Interpreted by me SVT rate of 170.  Right axis, no acute ischemic changes.  Repeat EKG after IV diltiazem shows atrial fibrillation, rate of 101.  Right axis, normal intervals.  No acute ischemic changes.  ____________________________________________    RADIOLOGY  [place] Upper Ext Art Left  Result Date: 07/12/2021 CLINICAL DATA:  Left upper extremity AV fistula for hemodialysis access. Absent rule with tenderness. Evaluate for thrombosis EXAM: LEFT UPPER EXTREMITY ARTERIAL DUPLEX SCAN TECHNIQUE: Gray-scale sonography as well as color Doppler and duplex ultrasound was performed to evaluate the hemodialysis AV fistula. COMPARISON:  None. FINDINGS: Targeted interrogation of the patient's arteriovenous fistula at the elbow. The fistula is thrombosed with near occlusive clot. There is no internal blood flow.  No focal fluid collection is seen. The remainder of the arteries in the left upper extremity were not interrogated. IMPRESSION: Thrombosed AV fistula in the left arm. These results were called by telephone at the time of interpretation on 07/12/2021 at 7:40 pm to provider Makyi Ledo , who verbally acknowledged these results. Electronically Signed   By: Keith Rake M.D.   On: 07/12/2021 19:41   DG Chest Portable 1 View  Result Date: 07/12/2021 CLINICAL DATA:  Shortness of breath x2 days. EXAM: PORTABLE CHEST 1 VIEW COMPARISON:  July 02, 2021 (8:32 a.m.) FINDINGS: Low lung volumes are seen. Mild, diffusely increased interstitial lung markings are noted. This is stable in appearance when compared to the prior study. There is no evidence of a pleural effusion or pneumothorax. The cardiac silhouette is markedly enlarged. There is marked severity calcification of the thoracic aorta. The visualized skeletal structures are unremarkable. IMPRESSION: 1. Stable cardiomegaly with mild pulmonary edema. Electronically Signed   By: Virgina Norfolk M.D.   On: 07/12/2021 19:20    ____________________________________________   PROCEDURES .Critical Care Performed by: Carrie Mew, MD Authorized by: Carrie Mew, MD   Critical care provider statement:    Critical care time (minutes):  35   Critical care time was exclusive of:  Separately billable procedures and treating other patients   Critical care was necessary to treat or prevent imminent or life-threatening deterioration of the following conditions:  Respiratory failure, metabolic crisis, renal failure, circulatory failure and cardiac failure   Critical care was time spent personally by me on the following activities:  Development of treatment plan with patient or surrogate, discussions with consultants, evaluation of patient's response to treatment, examination of patient, obtaining history from patient or surrogate, ordering and  performing treatments and interventions, ordering and review of laboratory studies, ordering and review of radiographic studies,  pulse oximetry, re-evaluation of patient's condition and review of old charts Comments:        Angiocath insertion  Date/Time: 07/12/2021 7:41 PM Performed by: Carrie Mew, MD Authorized by: Carrie Mew, MD  Consent: Verbal consent obtained. Consent given by: patient Patient identity confirmed: verbally with patient Time out: Immediately prior to procedure a "time out" was called to verify the correct patient, procedure, equipment, support staff and site/side marked as required. Preparation: Patient was prepped and draped in the usual sterile fashion. Local anesthesia used: no  Anesthesia: Local anesthesia used: no  Sedation: Patient sedated: no  Patient tolerance: patient tolerated the procedure well with no immediate complications Comments: Continuous [place] visualization. 20g R upper arm. 2 attempts. EBL 0. Good draw and flush.     ____________________________________________  DIFFERENTIAL DIAGNOSIS   Pulmonary edema, pleural effusion, pericardial effusion, electrolyte abnormality  CLINICAL IMPRESSION / ASSESSMENT AND PLAN / ED COURSE  Medications ordered in the ED: Medications  diltiazem (CARDIZEM) 25 MG/5ML injection (  Not Given 07/12/21 1947)  iohexol (OMNIPAQUE) 350 MG/ML injection 75 mL (has no administration in time range)  permethrin (ELIMITE) 5 % cream (has no administration in time range)  diltiazem (CARDIZEM) injection 15 mg (15 mg Intravenous Given 07/12/21 1906)    Pertinent labs & imaging results that were available during my care of the patient were reviewed by me and considered in my medical decision making (see chart for details).  Jordan [name] was evaluated in Emergency Department on 07/12/2021 for the symptoms described in the history of present illness. He was evaluated in the context of the global COVID-19  pandemic, which necessitated consideration that the patient might be at risk for infection with the SARS-CoV-2 virus that causes COVID-19. Institutional protocols and algorithms that pertain to the evaluation of patients at risk for COVID-19 are in a state of rapid change based on information released by regulatory bodies including the CDC and federal and state organizations. These policies and algorithms were followed during the patient's care in the ED.   Patient presents with shortness of breath in the setting of dialysis noncompliance and clinically apparent volume overload.  Doubt infection or sepsis.  Chest x-ray viewed and interpreted by me, shows some mild pulmonary edema changes but no effusion.  Patient is tachycardic, rhythm appears to be SVT.  After confirming blood pressure is acceptable, I will give a dose of diltiazem and follow-up labs.  Clinical Course as of 07/12/21 2300  Fri Jul 12, 2021  1937 After 50 mg of IV diltiazem, heart rate is improved to 98-110.  Rhythm appears to be atrial fibrillation.  Awaiting chemistries.  Chest x-ray viewed by me, shows some mild pulmonary edema but not commensurate with the degree of respiratory distress and tachycardia.  Ultrasound of his left upper extremity AV fistula demonstrates fistula thrombosis.  Will need CT angiogram of the chest to evaluate for PE. [PS]  1941 Ultrasound discussed with the radiologist Dr. Joelyn Oms who confirms that fistula is thrombosed. [PS]  2236 Case discussed with vascular Dr. Feliberto Gottron and nephrology Dr. Holley Raring coordinate care for dialysis access and hemodialysis tomorrow. [PS]    Clinical Course User Index [PS] Carrie Mew, MD     ____________________________________________   FINAL CLINICAL IMPRESSION(S) / ED DIAGNOSES    Final diagnoses:  Acute respiratory failure with hypoxia ([place])  ESRD on hemodialysis ([place])  AV fistula occlusion, initial encounter [facility])     ED Discharge Orders     None  Portions of this note were generated with dragon dictation software. Dictation errors may occur despite best attempts at proofreading.    Carrie Mew, MD 07/12/21 1941    Carrie Mew, MD 07/12/21 2300

## 2021-07-13 DIAGNOSIS — J9621 Acute and chronic respiratory failure with hypoxia: Secondary | ICD-10-CM | POA: Diagnosis not present

## 2021-07-13 LAB — CBC
HCT: 22.5 % — ABNORMAL LOW (ref 39.0–52.0)
HCT: 22.6 % — ABNORMAL LOW (ref 39.0–52.0)
Hemoglobin: 7.2 g/dL — ABNORMAL LOW (ref 13.0–17.0)
Hemoglobin: 6.8 g/dL — ABNORMAL LOW (ref 13.0–17.0)
MCH: 31.3 pg (ref 26.0–34.0)
MCH: 29.7 pg (ref 26.0–34.0)
MCHC: 32 g/dL (ref 30.0–36.0)
MCHC: 30.1 g/dL (ref 30.0–36.0)
MCV: 97.8 fL (ref 80.0–100.0)
MCV: 98.7 fL (ref 80.0–100.0)
Platelets: 123 10*3/uL — ABNORMAL LOW (ref 150–400)
Platelets: 134 10*3/uL — ABNORMAL LOW (ref 150–400)
RBC: 2.3 MIL/uL — ABNORMAL LOW (ref 4.22–5.81)
RBC: 2.29 MIL/uL — ABNORMAL LOW (ref 4.22–5.81)
RDW: 16.2 % — ABNORMAL HIGH (ref 11.5–15.5)
RDW: 16.3 % — ABNORMAL HIGH (ref 11.5–15.5)
WBC: 13.1 10*3/uL — ABNORMAL HIGH (ref 4.0–10.5)
WBC: 12.4 10*3/uL — ABNORMAL HIGH (ref 4.0–10.5)
nRBC: 0 % (ref 0.0–0.2)
nRBC: 0 % (ref 0.0–0.2)

## 2021-07-13 LAB — COMPREHENSIVE METABOLIC PANEL
ALT: 14 U/L (ref 0–44)
AST: 14 U/L — ABNORMAL LOW (ref 15–41)
Albumin: 2.8 g/dL — ABNORMAL LOW (ref 3.5–5.0)
Alkaline Phosphatase: 106 U/L (ref 38–126)
Anion gap: 19 — ABNORMAL HIGH (ref 5–15)
BUN: 108 mg/dL — ABNORMAL HIGH (ref 6–20)
CO2: 21 mmol/L — ABNORMAL LOW (ref 22–32)
Calcium: 9.1 mg/dL (ref 8.9–10.3)
Chloride: 103 mmol/L (ref 98–111)
Creatinine, Ser: 14.43 mg/dL — ABNORMAL HIGH (ref 0.61–1.24)
GFR, Estimated: 4 mL/min — ABNORMAL LOW (ref >60)
Glucose, Bld: 126 mg/dL — ABNORMAL HIGH (ref 70–99)
Potassium: 4.9 mmol/L (ref 3.5–5.1)
Sodium: 143 mmol/L (ref 135–145)
Total Bilirubin: 1.1 mg/dL (ref 0.3–1.2)
Total Protein: 6.6 g/dL (ref 6.5–8.1)

## 2021-07-13 LAB — RENAL FUNCTION PANEL
Albumin: 2.7 g/dL — ABNORMAL LOW (ref 3.5–5.0)
Anion gap: 20 — ABNORMAL HIGH (ref 5–15)
BUN: 133 mg/dL — ABNORMAL HIGH (ref 6–20)
CO2: 23 mmol/L (ref 22–32)
Calcium: 8.9 mg/dL (ref 8.9–10.3)
Chloride: 101 mmol/L (ref 98–111)
Creatinine, Ser: 14.84 mg/dL — ABNORMAL HIGH (ref 0.61–1.24)
GFR, Estimated: 4 mL/min — ABNORMAL LOW (ref >60)
Glucose, Bld: 105 mg/dL — ABNORMAL HIGH (ref 70–99)
Phosphorus: 9.6 mg/dL — ABNORMAL HIGH (ref 2.5–4.6)
Potassium: 5.6 mmol/L — ABNORMAL HIGH (ref 3.5–5.1)
Sodium: 144 mmol/L (ref 135–145)

## 2021-07-13 LAB — HEPATITIS B SURFACE ANTIBODY,QUALITATIVE: Hep B S Ab: REACTIVE — AB

## 2021-07-13 LAB — HEPATITIS B SURFACE ANTIGEN: Hepatitis B Surface Ag: NONREACTIVE

## 2021-07-13 LAB — CREATININE, SERUM
Creatinine, Ser: 14.6 mg/dL — ABNORMAL HIGH (ref 0.61–1.24)
GFR, Estimated: 4 mL/min — ABNORMAL LOW (ref >60)

## 2021-07-13 LAB — GLUCOSE, CAPILLARY: Glucose-Capillary: 91 mg/dL (ref 70–99)

## 2021-07-13 LAB — TROPONIN I (HIGH SENSITIVITY): Troponin I (High Sensitivity): 175 ng/L (ref <18)

## 2021-07-13 MED ORDER — ONDANSETRON HCL 4 MG PO TABS: 4.0000 mg | ORAL_TABLET | Freq: Four times a day (QID) | ORAL | Status: DC | PRN

## 2021-07-13 MED ORDER — LORAZEPAM 2 MG/ML IJ SOLN
INTRAMUSCULAR | Status: AC
  Administered 2021-07-13: 2 mg
  Filled 2021-07-13: qty 1

## 2021-07-13 MED ORDER — ZOLPIDEM TARTRATE 5 MG PO TABS: 5.0000 mg | ORAL_TABLET | Freq: Every evening | ORAL | Status: DC | PRN

## 2021-07-13 MED ORDER — HYDROXYZINE HCL 25 MG PO TABS
25.0000 mg | ORAL_TABLET | Freq: Three times a day (TID) | ORAL | Status: DC | PRN
  Administered 2021-07-13 – 2021-07-15 (×3): 25 mg via ORAL
  Filled 2021-07-13 (×5): qty 1

## 2021-07-13 MED ORDER — ACETAMINOPHEN 325 MG PO TABS
650.0000 mg | ORAL_TABLET | Freq: Four times a day (QID) | ORAL | Status: DC | PRN
  Filled 2021-07-13: qty 2

## 2021-07-13 MED ORDER — SORBITOL 70 % SOLN
30.0000 mL | Status: DC | PRN
  Filled 2021-07-13: qty 30

## 2021-07-13 MED ORDER — CHLORHEXIDINE GLUCONATE CLOTH 2 % EX PADS
6.0000 | MEDICATED_PAD | Freq: Every day | CUTANEOUS | Status: DC
  Administered 2021-07-14 – 2021-07-15 (×2): 6 via TOPICAL

## 2021-07-13 MED ORDER — ALBUMIN HUMAN 25 % IV SOLN
INTRAVENOUS | Status: AC
  Administered 2021-07-13: 25 g via INTRAVENOUS
  Filled 2021-07-13: qty 100

## 2021-07-13 MED ORDER — LORAZEPAM 2 MG/ML IJ SOLN
2.0000 mg | Freq: Once | INTRAMUSCULAR | Status: AC
  Administered 2021-07-13: 2 mg via INTRAVENOUS

## 2021-07-13 MED ORDER — ACETAMINOPHEN 650 MG RE SUPP: 650.0000 mg | Freq: Four times a day (QID) | RECTAL | Status: DC | PRN

## 2021-07-13 MED ORDER — SODIUM CHLORIDE 0.9 % IV SOLN: 100.0000 mL | INTRAVENOUS | Status: DC | PRN

## 2021-07-13 MED ORDER — CAMPHOR-MENTHOL 0.5-0.5 % EX LOTN
1.0000 "application " | TOPICAL_LOTION | Freq: Three times a day (TID) | CUTANEOUS | Status: DC | PRN
  Administered 2021-07-13 – 2021-07-15 (×2): 1 via TOPICAL
  Filled 2021-07-13 (×2): qty 222

## 2021-07-13 MED ORDER — LIDOCAINE-PRILOCAINE 2.5-2.5 % EX CREA
1.0000 "application " | TOPICAL_CREAM | CUTANEOUS | Status: DC | PRN
  Filled 2021-07-13: qty 5

## 2021-07-13 MED ORDER — DEXMEDETOMIDINE HCL IN NACL 400 MCG/100ML IV SOLN
0.4000 ug/kg/h | INTRAVENOUS | Status: DC
  Administered 2021-07-13: 1 ug/kg/h via INTRAVENOUS

## 2021-07-13 MED ORDER — NOREPINEPHRINE 4 MG/250ML-% IV SOLN
INTRAVENOUS | Status: AC
  Administered 2021-07-13: 5 ug/min via INTRAVENOUS
  Filled 2021-07-13: qty 250

## 2021-07-13 MED ORDER — HEPARIN SODIUM (PORCINE) 1000 UNIT/ML DIALYSIS
100.0000 [IU]/kg | INTRAMUSCULAR | Status: DC | PRN
  Filled 2021-07-13: qty 8

## 2021-07-13 MED ORDER — NEPRO/CARBSTEADY PO LIQD: 237.0000 mL | Freq: Three times a day (TID) | ORAL | Status: DC | PRN

## 2021-07-13 MED ORDER — LIDOCAINE HCL (PF) 1 % IJ SOLN
5.0000 mL | INTRAMUSCULAR | Status: DC | PRN
  Filled 2021-07-13: qty 5

## 2021-07-13 MED ORDER — ALTEPLASE 2 MG IJ SOLR
2.0000 mg | Freq: Once | INTRAMUSCULAR | Status: DC | PRN
  Filled 2021-07-13: qty 2

## 2021-07-13 MED ORDER — PENTAFLUOROPROP-TETRAFLUOROETH EX AERO
1.0000 "application " | INHALATION_SPRAY | CUTANEOUS | Status: DC | PRN
  Filled 2021-07-13: qty 30

## 2021-07-13 MED ORDER — HEPARIN SODIUM (PORCINE) 5000 UNIT/ML IJ SOLN
5000.0000 [IU] | Freq: Three times a day (TID) | INTRAMUSCULAR | Status: DC
  Administered 2021-07-13 – 2021-07-15 (×6): 5000 [IU] via SUBCUTANEOUS
  Filled 2021-07-13 (×9): qty 1

## 2021-07-13 MED ORDER — CHLORHEXIDINE GLUCONATE CLOTH 2 % EX PADS
6.0000 | MEDICATED_PAD | Freq: Every day | CUTANEOUS | Status: DC
  Administered 2021-07-16: 6 via TOPICAL

## 2021-07-13 MED ORDER — ONDANSETRON HCL 4 MG/2ML IJ SOLN: 4.0000 mg | Freq: Four times a day (QID) | INTRAMUSCULAR | Status: DC | PRN

## 2021-07-13 MED ORDER — HEPARIN SODIUM (PORCINE) 1000 UNIT/ML DIALYSIS
1000.0000 [IU] | INTRAMUSCULAR | Status: DC | PRN
  Filled 2021-07-13: qty 1

## 2021-07-13 MED ORDER — CALCIUM CARBONATE ANTACID 1250 MG/5ML PO SUSP
500.0000 mg | Freq: Four times a day (QID) | ORAL | Status: DC | PRN
Start: 2021-07-13 — End: 2021-07-16
  Filled 2021-07-13: qty 5

## 2021-07-13 MED ORDER — DOCUSATE SODIUM 283 MG RE ENEM
1.0000 | ENEMA | RECTAL | Status: DC | PRN
  Filled 2021-07-13 (×2): qty 1

## 2021-07-13 MED ORDER — METOPROLOL TARTRATE 5 MG/5ML IV SOLN
5.0000 mg | Freq: Once | INTRAVENOUS | Status: AC
  Administered 2021-07-13: 5 mg via INTRAVENOUS
  Filled 2021-07-13: qty 5

## 2021-07-13 MED ORDER — NOREPINEPHRINE 4 MG/250ML-% IV SOLN
0.0000 ug/min | INTRAVENOUS | Status: DC
  Filled 2021-07-13: qty 250

## 2021-07-13 MED ORDER — ALBUMIN HUMAN 25 % IV SOLN: 25.0000 g | Freq: Once | INTRAVENOUS | Status: AC

## 2021-07-13 NOTE — Progress Notes (Signed)
Patient transported to ICU from ED on [facility] without  complication. Will continue to monitor.

## 2021-07-13 NOTE — Consult Note (Signed)
Reason for Consult: Fluid overload due to end-stage renal disease and noncompliance to hemodialysis/nonfunctioning left AV fistula Referring Physician: Dr. [name] is an 55 y.o. male.  HPI: This is a 55 year old male patient with end-stage renal disease and nonfunctioning left AV fistula with history of noncompliance to recommendations/medical therapy.  Past Medical History:  Diagnosis Date   Anemia    Aortic atherosclerosis ([place]) 11/12/2019   ED (erectile dysfunction)    Emphysema of lung ([place]) 11/12/2019   ESRD on hemodialysis ([place])    M/W/F in [place], [place]   ETOH abuse    History of blood transfusion    Hypertension    Wears dentures     Past Surgical History:  Procedure Laterality Date   BASCILIC VEIN TRANSPOSITION Left 08/07/2016   Procedure: LEFT BASILIC VEIN TRANSPOSITION;  Surgeon: Jordan Mould, MD;  Location: [facility];  Service: Vascular;  Laterality: Left;   CLOSED REDUCTION NASAL FRACTURE N/A 08/11/2019   Procedure: CLOSED REDUCTION NASAL FRACTURE WITH STABILIZATION;  Surgeon: Jordan Limbo, MD;  Location: [facility];  Service: Plastics;  Laterality: N/A;   COLONOSCOPY N/A 08/12/2016   Procedure: COLONOSCOPY;  Surgeon: Jordan Brace, MD;  Location: [facility];  Service: Gastroenterology;  Laterality: N/A;   COLONOSCOPY WITH PROPOFOL N/A 06/05/2021   Procedure: COLONOSCOPY WITH PROPOFOL;  Surgeon: Jordan Creamer, MD;  Location: [facility];  Service: Gastroenterology;  Laterality: N/A;   ESOPHAGOGASTRODUODENOSCOPY N/A 08/12/2016   Procedure: ESOPHAGOGASTRODUODENOSCOPY (EGD);  Surgeon: Jordan Brace, MD;  Location: [facility];  Service: Gastroenterology;  Laterality: N/A;   ESOPHAGOGASTRODUODENOSCOPY (EGD) WITH PROPOFOL N/A 06/05/2021   Procedure: ESOPHAGOGASTRODUODENOSCOPY (EGD) WITH PROPOFOL;  Surgeon: Jordan Creamer, MD;  Location: [facility];  Service: Gastroenterology;  Laterality: N/A;   INSERTION OF DIALYSIS CATHETER N/A  08/07/2016   Procedure: INSERTION OF TUNNELED DIALYSIS CATHETER;  Surgeon: Jordan Mould, MD;  Location: [facility];  Service: Vascular;  Laterality: N/A;   LIGATION OF ARTERIOVENOUS  FISTULA Left 09/12/2016   Procedure: BANDING OF LEFT  ARTERIOVENOUS  FISTULA;  Surgeon: Jordan Mould, MD;  Location: [facility];  Service: Vascular;  Laterality: Left;   LOWER EXTREMITY INTERVENTION Right 12/02/2018   Procedure: LOWER EXTREMITY INTERVENTION;  Surgeon: Jordan Huxley, MD;  Location: [facility];  Service: Cardiovascular;  Laterality: Right;   POLYPECTOMY  06/05/2021   Procedure: POLYPECTOMY;  Surgeon: Jordan Creamer, MD;  Location: [facility];  Service: Gastroenterology;;    Family History  Problem Relation Age of Onset   Diabetes Mother    Kidney failure Mother    Healthy Father    Kidney failure Brother    Healthy Sister    Kidney disease Daughter    Post-traumatic stress disorder Neg Hx    Bladder Cancer Neg Hx    Kidney cancer Neg Hx     Social History:  reports that he has been smoking cigarettes. He has a 20.00 pack-year smoking history. He has never used smokeless tobacco. He reports that he does not currently use alcohol after a past usage of about 21.0 standard drinks per week. He reports that he does not use drugs.  Allergies:  Allergies  Allergen Reactions   Lisinopril Swelling    Medications: I have reviewed the patient's current medications. Prior to Admission:  Medications Prior to Admission  Medication Sig Dispense Refill Last Dose   albuterol (VENTOLIN HFA) 108 (90 Base) MCG/ACT inhaler Inhale 2 puffs into the lungs every 4 (four) hours as needed  for wheezing or shortness of breath. 1 each 3 Unknown at PRN   calcium acetate (PHOSLO) 667 MG capsule Take 2 capsules (1,334 mg total) by mouth 3 (three) times daily with meals. 90 capsule 2 Unknown at Unknown   cinacalcet (SENSIPAR) 30 MG tablet Take 1 tablet (30 mg total) by mouth daily with breakfast. 30  tablet 2 Unknown at Unknown   docusate sodium (COLACE) 100 MG capsule Take 1 capsule (100 mg total) by mouth 2 (two) times daily. 10 capsule 0 Unknown at Unknown   epoetin alfa (EPOGEN) 4000 UNIT/ML injection Inject 1 mL (4,000 Units total) into the vein every Monday, Wednesday, and Friday with hemodialysis. 1 mL     ferrous sulfate 325 (65 FE) MG tablet Take 1 tablet (325 mg total) by mouth 2 (two) times daily with a meal. 60 tablet 3 Unknown at Unknown   metoprolol tartrate (LOPRESSOR) 50 MG tablet Take 1 tablet (50 mg total) by mouth 2 (two) times daily. 60 tablet 0 Unknown at Unknown   pantoprazole (PROTONIX) 20 MG tablet Take 1 tablet (20 mg total) by mouth 2 (two) times daily before a meal. 60 tablet 1 Unknown at Unknown   Continuous:  dexmedetomidine (PRECEDEX) IV infusion 1 mcg/kg/hr (07/13/21 1123)   Anti-infectives (From admission, onward)    None       Results for orders placed or performed during the hospital encounter of 07/12/21 (from the past 48 hour(s))  Comprehensive metabolic panel     Status: Abnormal   Collection Time: 07/12/21  6:51 PM  Result Value Ref Range   Sodium 142 135 - 145 mmol/L   Potassium 4.9 3.5 - 5.1 mmol/L   Chloride 102 98 - 111 mmol/L   CO2 20 (L) 22 - 32 mmol/L   Glucose, Bld 149 (H) 70 - 99 mg/dL    Comment: Glucose reference range applies only to samples taken after fasting for at least 8 hours.   BUN 110 (H) 6 - 20 mg/dL    Comment: RESULT CONFIRMED BY MANUAL DILUTION DLB   Creatinine, Ser 14.11 (H) 0.61 - 1.24 mg/dL   Calcium 9.1 8.9 - 10.3 mg/dL   Total Protein 8.0 6.5 - 8.1 g/dL   Albumin 3.2 (L) 3.5 - 5.0 g/dL   AST 16 15 - 41 U/L   ALT 16 0 - 44 U/L   Alkaline Phosphatase 108 38 - 126 U/L   Total Bilirubin 1.5 (H) 0.3 - 1.2 mg/dL   GFR, Estimated 4 (L) >60 mL/min    Comment: (NOTE) Calculated using the CKD-EPI Creatinine Equation (2021)    Anion gap 20 (H) 5 - 15    Comment: Performed at [facility], [address] [place], [place] [address]  Lipase, blood     Status: None   Collection Time: 07/12/21  6:51 PM  Result Value Ref Range   Lipase 34 11 - 51 U/L    Comment: Performed at [facility], [address] [place], [place] [address]  Troponin I (High Sensitivity)     Status: Abnormal   Collection Time: 07/12/21  6:51 PM  Result Value Ref Range   Troponin I (High Sensitivity) 141 (HH) <18 ng/L    Comment: CRITICAL RESULT CALLED TO, READ BACK BY AND VERIFIED WITH ELANA GABORIAULT AT 1937 07/12/2021 DLB (NOTE) Elevated high sensitivity troponin I (hsTnI) values and significant  changes across serial measurements may suggest ACS but many other  chronic and acute conditions are known to elevate  hsTnI results.  Refer to the Links section for chest pain algorithms and additional  guidance. Performed at [facility], [address] [place], [place] [address]   CBC with Differential     Status: Abnormal   Collection Time: 07/12/21  6:51 PM  Result Value Ref Range   WBC 13.9 (H) 4.0 - 10.5 K/uL   RBC 2.62 (L) 4.22 - 5.81 MIL/uL   Hemoglobin 8.2 (L) 13.0 - 17.0 g/dL   HCT 25.7 (L) 39.0 - 52.0 %   MCV 98.1 80.0 - 100.0 fL   MCH 31.3 26.0 - 34.0 pg   MCHC 31.9 30.0 - 36.0 g/dL   RDW 16.3 (H) 11.5 - 15.5 %   Platelets 141 (L) 150 - 400 K/uL   nRBC 0.0 0.0 - 0.2 %   Neutrophils Relative % 89 %   Neutro Abs 12.3 (H) 1.7 - 7.7 K/uL   Lymphocytes Relative 4 %   Lymphs Abs 0.5 (L) 0.7 - 4.0 K/uL   Monocytes Relative 6 %   Monocytes Absolute 0.9 0.1 - 1.0 K/uL   Eosinophils Relative 0 %   Eosinophils Absolute 0.1 0.0 - 0.5 K/uL   Basophils Relative 0 %   Basophils Absolute 0.0 0.0 - 0.1 K/uL   Immature Granulocytes 1 %   Abs Immature Granulocytes 0.09 (H) 0.00 - 0.07 K/uL    Comment: Performed at [facility], [address] [place], [place] [address]  Resp Panel by RT-PCR (Flu A&B, Covid) Nasopharyngeal Swab     Status: None   Collection Time: 07/12/21  6:51 PM    Specimen: Nasopharyngeal Swab; Nasopharyngeal(NP) swabs in vial transport medium  Result Value Ref Range   SARS Coronavirus 2 by RT PCR NEGATIVE NEGATIVE    Comment: (NOTE) SARS-CoV-2 target nucleic acids are NOT DETECTED.  The SARS-CoV-2 RNA is generally detectable in upper respiratory specimens during the acute phase of infection. The lowest concentration of SARS-CoV-2 viral copies this assay can detect is 138 copies/mL. A negative result does not preclude SARS-Cov-2 infection and should not be used as the sole basis for treatment or other patient management decisions. A negative result may occur with  improper specimen collection/handling, submission of specimen other than nasopharyngeal swab, presence of viral mutation(s) within the areas targeted by this assay, and inadequate number of viral copies(<138 copies/mL). A negative result must be combined with clinical observations, patient history, and epidemiological information. The expected result is Negative.  Fact Sheet for Patients:  EntrepreneurPulse.com.au  Fact Sheet for Healthcare Providers:  IncredibleEmployment.be  This test is no t yet approved or cleared by the [place] FDA and  has been authorized for detection and/or diagnosis of SARS-CoV-2 by FDA under an Emergency Use Authorization (EUA). This EUA will remain  in effect (meaning this test can be used) for the duration of the COVID-19 declaration under Section 564(b)(1) of the Act, 21 U.S.C.section 360bbb-3(b)(1), unless the authorization is terminated  or revoked sooner.       Influenza A by PCR NEGATIVE NEGATIVE   Influenza B by PCR NEGATIVE NEGATIVE    Comment: (NOTE) The Xpert Xpress SARS-CoV-2/FLU/RSV plus assay is intended as an aid in the diagnosis of influenza from Nasopharyngeal swab specimens and should not be used as a sole basis for treatment. Nasal washings and aspirates are unacceptable for Xpert Xpress  SARS-CoV-2/FLU/RSV testing.  Fact Sheet for Patients: EntrepreneurPulse.com.au  Fact Sheet for Healthcare Providers: IncredibleEmployment.be  This test is not yet approved or cleared by the  [place] Architectural technologist and has been authorized for detection and/or diagnosis of SARS-CoV-2 by FDA under an Print production planner (EUA). This EUA will remain in effect (meaning this test can be used) for the duration of the COVID-19 declaration under Section 564(b)(1) of the Act, 21 U.S.C. section 360bbb-3(b)(1), unless the authorization is terminated or revoked.  Performed at [facility], [address] [place], [place] [address]   Protime-INR     Status: Abnormal   Collection Time: 07/12/21  6:51 PM  Result Value Ref Range   Prothrombin Time 16.9 (H) 11.4 - 15.2 seconds   INR 1.4 (H) 0.8 - 1.2    Comment: (NOTE) INR goal varies based on device and disease states. Performed at [facility], [address] [place], [place] [address]   APTT     Status: None   Collection Time: 07/12/21  6:51 PM  Result Value Ref Range   aPTT 33 24 - 36 seconds    Comment: Performed at [facility], [address] [place], [place] [address]  Magnesium     Status: Abnormal   Collection Time: 07/12/21  6:51 PM  Result Value Ref Range   Magnesium 2.6 (H) 1.7 - 2.4 mg/dL    Comment: Performed at [facility], [address] [place], [place] [address]  Glucose, capillary     Status: None   Collection Time: 07/13/21  1:44 AM  Result Value Ref Range   Glucose-Capillary 91 70 - 99 mg/dL    Comment: Glucose reference range applies only to samples taken after fasting for at least 8 hours.  Troponin I (High Sensitivity)     Status: Abnormal   Collection Time: 07/13/21  1:55 AM  Result Value Ref Range   Troponin I (High Sensitivity) 175 (HH) <18 ng/L    Comment: CRITICAL VALUE NOTED. VALUE IS CONSISTENT WITH PREVIOUSLY  REPORTED/CALLED VALUE DLB (NOTE) Elevated high sensitivity troponin I (hsTnI) values and significant  changes across serial measurements may suggest ACS but many other  chronic and acute conditions are known to elevate hsTnI results.  Refer to the "Links" section for chest pain algorithms and additional  guidance. Performed at [facility], [address] [place], [place] [address]   Creatinine, serum     Status: Abnormal   Collection Time: 07/13/21  1:55 AM  Result Value Ref Range   Creatinine, Ser 14.60 (H) 0.61 - 1.24 mg/dL   GFR, Estimated 4 (L) >60 mL/min    Comment: (NOTE) Calculated using the CKD-EPI Creatinine Equation (2021) Performed at [facility], [address] [place], [place] [address]   Comprehensive metabolic panel     Status: Abnormal   Collection Time: 07/13/21  3:11 AM  Result Value Ref Range   Sodium 143 135 - 145 mmol/L   Potassium 4.9 3.5 - 5.1 mmol/L   Chloride 103 98 - 111 mmol/L   CO2 21 (L) 22 - 32 mmol/L   Glucose, Bld 126 (H) 70 - 99 mg/dL    Comment: Glucose reference range applies only to samples taken after fasting for at least 8 hours.   BUN 108 (H) 6 - 20 mg/dL    Comment: RESULT CONFIRMED BY MANUAL DILUTION DLB   Creatinine, Ser 14.43 (H) 0.61 - 1.24 mg/dL   Calcium 9.1 8.9 - 10.3 mg/dL   Total Protein 6.6 6.5 - 8.1 g/dL   Albumin 2.8 (L) 3.5 - 5.0 g/dL   AST 14 (L) 15 - 41 U/L  ALT 14 0 - 44 U/L   Alkaline Phosphatase 106 38 - 126 U/L   Total Bilirubin 1.1 0.3 - 1.2 mg/dL   GFR, Estimated 4 (L) >60 mL/min    Comment: (NOTE) Calculated using the CKD-EPI Creatinine Equation (2021)    Anion gap 19 (H) 5 - 15    Comment: Performed at [facility], [address] [place], [place] [address]  CBC     Status: Abnormal   Collection Time: 07/13/21  3:11 AM  Result Value Ref Range   WBC 13.1 (H) 4.0 - 10.5 K/uL   RBC 2.30 (L) 4.22 - 5.81 MIL/uL   Hemoglobin 7.2 (L) 13.0 - 17.0 g/dL   HCT 22.5 (L) 39.0 -  52.0 %   MCV 97.8 80.0 - 100.0 fL   MCH 31.3 26.0 - 34.0 pg   MCHC 32.0 30.0 - 36.0 g/dL   RDW 16.2 (H) 11.5 - 15.5 %   Platelets 123 (L) 150 - 400 K/uL   nRBC 0.0 0.0 - 0.2 %    Comment: Performed at [facility], [address], [place], [place] [address]    [place] Upper Ext Art Left  Result Date: 07/12/2021 CLINICAL DATA:  Left upper extremity AV fistula for hemodialysis access. Absent rule with tenderness. Evaluate for thrombosis EXAM: LEFT UPPER EXTREMITY ARTERIAL DUPLEX SCAN TECHNIQUE: Gray-scale sonography as well as color Doppler and duplex ultrasound was performed to evaluate the hemodialysis AV fistula. COMPARISON:  None. FINDINGS: Targeted interrogation of the patient's arteriovenous fistula at the elbow. The fistula is thrombosed with near occlusive clot. There is no internal blood flow. No focal fluid collection is seen. The remainder of the arteries in the left upper extremity were not interrogated. IMPRESSION: Thrombosed AV fistula in the left arm. These results were called by telephone at the time of interpretation on 07/12/2021 at 7:40 pm to provider PHILLIP STAFFORD , who verbally acknowledged these results. Electronically Signed   By: Jordan Rake M.D.   On: 07/12/2021 19:41   DG Chest Portable 1 View  Result Date: 07/12/2021 CLINICAL DATA:  Shortness of breath x2 days. EXAM: PORTABLE CHEST 1 VIEW COMPARISON:  July 02, 2021 (8:32 a.m.) FINDINGS: Low lung volumes are seen. Mild, diffusely increased interstitial lung markings are noted. This is stable in appearance when compared to the prior study. There is no evidence of a pleural effusion or pneumothorax. The cardiac silhouette is markedly enlarged. There is marked severity calcification of the thoracic aorta. The visualized skeletal structures are unremarkable. IMPRESSION: 1. Stable cardiomegaly with mild pulmonary edema. Electronically Signed   By: Jordan Norfolk M.D.   On: 07/12/2021 19:20    Review of  Systems Blood pressure 109/70, pulse (!) 40, temperature 98.2 F (36.8 C), temperature source Axillary, resp. rate (!) 33, height 5\' 4"  (1.626 m), weight 75.2 kg, SpO2 (!) 82 %. Physical Exam Constitutional:      General: He is in acute distress.     Appearance: He is obese. He is ill-appearing and toxic-appearing.  HENT:     Head: Normocephalic and atraumatic.  Eyes:     Extraocular Movements: Extraocular movements intact.     Pupils: Pupils are equal, round, and reactive to light.  Cardiovascular:     Rate and Rhythm: Tachycardia present.  Pulmonary:     Effort: Tachypnea present.  Abdominal:     General: Bowel sounds are normal.  Musculoskeletal:        General: Normal range of motion.  Cervical back: Normal range of motion and neck supple.  Skin:    General: Skin is warm.     Capillary Refill: Capillary refill takes less than 2 seconds.  Neurological:     Mental Status: He is disoriented.  Psychiatric:        Behavior: Behavior is agitated.    Assessment/Plan: Fluid overload with end-stage renal disease. Her plan to put in a emergent right femoral temporary HD catheter.  An attempt was made to notify the family without any response.  Consent was signed and witnessed and deemed to be emergent.  Jordan [name] 07/13/2021, 12:08 PM

## 2021-07-13 NOTE — Consult Note (Signed)
Referring Provider: No ref. provider found Primary Care Physician:  Pcp, No Primary Nephrologist:  Dr.   Luiz Iron for Consultation:    HPI: 55 year old male with history of hypertension, coronary artery disease, smoking, COPD, congestive heart failure, end-stage renal disease on dialysis now comes into the emergency room last night after missing 1 week of dialysis.  He is presently in the intensive care unit on isolation for possible scabies.  He was initially on BiPAP until this morning presently off of it.  He denies any chest pain.  Patient had an AV fistula on the left arm which does not have a bruit or thrill.  Patient also complains of tenderness over the access site.  Past Medical History:  Diagnosis Date   Anemia    Aortic atherosclerosis ([place]) 11/12/2019   ED (erectile dysfunction)    Emphysema of lung ([place]) 11/12/2019   ESRD on hemodialysis ([place])    M/W/F in [place], [place]   ETOH abuse    History of blood transfusion    Hypertension    Wears dentures     Past Surgical History:  Procedure Laterality Date   BASCILIC VEIN TRANSPOSITION Left 08/07/2016   Procedure: LEFT BASILIC VEIN TRANSPOSITION;  Surgeon: Angelia Mould, MD;  Location: [facility];  Service: Vascular;  Laterality: Left;   CLOSED REDUCTION NASAL FRACTURE N/A 08/11/2019   Procedure: CLOSED REDUCTION NASAL FRACTURE WITH STABILIZATION;  Surgeon: Irene Limbo, MD;  Location: [facility];  Service: Plastics;  Laterality: N/A;   COLONOSCOPY N/A 08/12/2016   Procedure: COLONOSCOPY;  Surgeon: Otis Brace, MD;  Location: [facility];  Service: Gastroenterology;  Laterality: N/A;   COLONOSCOPY WITH PROPOFOL N/A 06/05/2021   Procedure: COLONOSCOPY WITH PROPOFOL;  Surgeon: Sharyn Creamer, MD;  Location: [facility];  Service: Gastroenterology;  Laterality: N/A;   ESOPHAGOGASTRODUODENOSCOPY N/A 08/12/2016   Procedure: ESOPHAGOGASTRODUODENOSCOPY (EGD);  Surgeon: Otis Brace, MD;  Location: [facility];  Service:  Gastroenterology;  Laterality: N/A;   ESOPHAGOGASTRODUODENOSCOPY (EGD) WITH PROPOFOL N/A 06/05/2021   Procedure: ESOPHAGOGASTRODUODENOSCOPY (EGD) WITH PROPOFOL;  Surgeon: Sharyn Creamer, MD;  Location: [facility];  Service: Gastroenterology;  Laterality: N/A;   INSERTION OF DIALYSIS CATHETER N/A 08/07/2016   Procedure: INSERTION OF TUNNELED DIALYSIS CATHETER;  Surgeon: Angelia Mould, MD;  Location: [facility];  Service: Vascular;  Laterality: N/A;   LIGATION OF ARTERIOVENOUS  FISTULA Left 09/12/2016   Procedure: BANDING OF LEFT  ARTERIOVENOUS  FISTULA;  Surgeon: Angelia Mould, MD;  Location: [facility];  Service: Vascular;  Laterality: Left;   LOWER EXTREMITY INTERVENTION Right 12/02/2018   Procedure: LOWER EXTREMITY INTERVENTION;  Surgeon: Algernon Huxley, MD;  Location: [facility];  Service: Cardiovascular;  Laterality: Right;   POLYPECTOMY  06/05/2021   Procedure: POLYPECTOMY;  Surgeon: Sharyn Creamer, MD;  Location: [name];  Service: Gastroenterology;;    Prior to Admission medications   Medication Sig Start Date End Date Taking? Authorizing Provider  albuterol (VENTOLIN HFA) 108 (90 Base) MCG/ACT inhaler Inhale 2 puffs into the lungs every 4 (four) hours as needed for wheezing or shortness of breath. 06/05/21  Yes Danford, Suann Larry, MD  calcium acetate (PHOSLO) 667 MG capsule Take 2 capsules (1,334 mg total) by mouth 3 (three) times daily with meals. 06/05/21  Yes Danford, Suann Larry, MD  cinacalcet (SENSIPAR) 30 MG tablet Take 1 tablet (30 mg total) by mouth daily with breakfast. 06/05/21  Yes Danford, Suann Larry, MD  docusate sodium (COLACE) 100 MG capsule Take 1 capsule (100  mg total) by mouth 2 (two) times daily. 06/05/21  Yes Danford, Suann Larry, MD  epoetin alfa (EPOGEN) 4000 UNIT/ML injection Inject 1 mL (4,000 Units total) into the vein every Monday, Wednesday, and Friday with hemodialysis. 06/22/20  Yes Enzo Bi, MD  ferrous sulfate 325 (65 FE) MG tablet  Take 1 tablet (325 mg total) by mouth 2 (two) times daily with a meal. 06/05/21  Yes Danford, Suann Larry, MD  metoprolol tartrate (LOPRESSOR) 50 MG tablet Take 1 tablet (50 mg total) by mouth 2 (two) times daily. 07/04/21  Yes Jennye Boroughs, MD  pantoprazole (PROTONIX) 20 MG tablet Take 1 tablet (20 mg total) by mouth 2 (two) times daily before a meal. 06/05/21 08/04/21 Yes Danford, Suann Larry, MD    Current Facility-Administered Medications  Medication Dose Route Frequency Provider Last Rate Last Admin   acetaminophen (TYLENOL) tablet 650 mg  650 mg Oral Q6H PRN Elwyn Reach, MD       Or   acetaminophen (TYLENOL) suppository 650 mg  650 mg Rectal Q6H PRN Elwyn Reach, MD       calcium carbonate (dosed in mg elemental calcium) suspension 500 mg of elemental calcium  500 mg of elemental calcium Oral Q6H PRN Elwyn Reach, MD       camphor-menthol (SARNA) lotion 1 application  1 application Topical K9X PRN Elwyn Reach, MD   1 application at 83/38/25 0539   And   hydrOXYzine (ATARAX/VISTARIL) tablet 25 mg  25 mg Oral Q8H PRN Elwyn Reach, MD   25 mg at 07/13/21 0650   docusate sodium (ENEMEEZ) enema 283 mg  1 enema Rectal PRN Elwyn Reach, MD       feeding supplement (NEPRO CARB STEADY) liquid 237 mL  237 mL Oral TID PRN Elwyn Reach, MD       heparin injection 5,000 Units  5,000 Units Subcutaneous Q8H Elwyn Reach, MD   5,000 Units at 07/13/21 0531   iohexol (OMNIPAQUE) 350 MG/ML injection 75 mL  75 mL Intravenous Once PRN Carrie Mew, MD       nicotine (NICODERM CQ - dosed in mg/24 hours) patch 21 mg  21 mg Transdermal Daily Gala Romney L, MD   21 mg at 07/13/21 1001   ondansetron (ZOFRAN) tablet 4 mg  4 mg Oral Q6H PRN Elwyn Reach, MD       Or   ondansetron (ZOFRAN) injection 4 mg  4 mg Intravenous Q6H PRN Elwyn Reach, MD       sorbitol 70 % solution 30 mL  30 mL Oral PRN Elwyn Reach, MD       zolpidem (AMBIEN) tablet 5  mg  5 mg Oral QHS PRN Elwyn Reach, MD        Allergies as of 07/12/2021 - Review Complete 07/12/2021  Allergen Reaction Noted   Lisinopril Swelling 01/15/2021    Family History  Problem Relation Age of Onset   Diabetes Mother    Kidney failure Mother    Healthy Father    Kidney failure Brother    Healthy Sister    Kidney disease Daughter    Post-traumatic stress disorder Neg Hx    Bladder Cancer Neg Hx    Kidney cancer Neg Hx     Social History   Socioeconomic History   Marital status: Single    Spouse name: Not on file   Number of children: Not on file   Years of education: Not  on file   Highest education level: Not on file  Occupational History   Occupation: disabled  Tobacco Use   Smoking status: Every Day    Packs/day: 0.50    Years: 40.00    Pack years: 20.00    Types: Cigarettes   Smokeless tobacco: Never  Vaping Use   Vaping Use: Never used  Substance and Sexual Activity   Alcohol use: Not Currently    Alcohol/week: 21.0 standard drinks    Types: 21 Cans of beer per week    Comment: last drink 6 months ago   Drug use: No   Sexual activity: Not Currently  Other Topics Concern   Not on file  Social History Narrative   Not on file   Social Determinants of Health   Financial Resource Strain: Not on file  Food Insecurity: Not on file  Transportation Needs: Not on file  Physical Activity: Not on file  Stress: Not on file  Social Connections: Not on file  Intimate Partner Violence: Not on file    Review of Systems: Gen: Denies any fever, chills, sweats, anorexia, fatigue, weakness, malaise, weight loss, and sleep disorder HEENT: No visual complaints, No history of Retinopathy. Normal external appearance No Epistaxis or Sore throat. No sinusitis.   CV: Denies chest pain, angina, palpitations, syncope, orthopnea, PND, peripheral edema, and claudication. Resp: Denies dyspnea at rest, dyspnea with exercise, cough, sputum, wheezing, coughing up  blood, and pleurisy. GI: Denies vomiting blood, jaundice, and fecal incontinence.   Denies dysphagia or odynophagia. GU : Denies urinary burning, blood in urine, urinary frequency, urinary hesitancy, nocturnal urination, and urinary incontinence.  No renal calculi. MS: Denies joint pain, limitation of movement, and swelling, stiffness, low back pain, extremity pain. Denies muscle weakness, cramps, atrophy.  No use of non steroidal antiinflammatory drugs. Derm: Denies rash, itching, dry skin, hives, moles, warts, or unhealing ulcers.  Psych: Denies depression, anxiety, memory loss, suicidal ideation, hallucinations, paranoia, and confusion. Heme: Denies bruising, bleeding, and enlarged lymph nodes. Neuro: No headache.  No diplopia. No dysarthria.  No dysphasia.  No history of CVA.  No Seizures. No paresthesias.  No weakness. Endocrine No DM.  No Thyroid disease.  No Adrenal disease.  Physical Exam: Vital signs in last 24 hours: Temp:  [98.2 F (36.8 C)] 98.2 F (36.8 C) (10/22 0134) Pulse Rate:  [29-175] 91 (10/22 0600) Resp:  [8-27] 20 (10/22 0600) BP: (100-211)/(62-187) 138/96 (10/22 0600) SpO2:  [88 %-100 %] 92 % (10/22 0600) Weight:  [75.2 kg-88 kg] 75.2 kg (10/22 0500)   General:   Alert,  Well-developed, well-nourished, pleasant and cooperative in NAD Head:  Normocephalic and atraumatic. Eyes:  Sclera clear, no icterus.   Conjunctiva pink. Ears:  Normal auditory acuity. Nose:  No deformity, discharge,  or lesions. Mouth:  No deformity or lesions, dentition normal. Neck:  Supple; no masses or thyromegaly. JVP not elevated Lungs:  Clear throughout to auscultation.   No wheezes, crackles, or rhonchi. No acute distress. Heart:  Regular rate and rhythm; no murmurs, clicks, rubs,  or gallops. Abdomen:  Soft, nontender and nondistended. No masses, hepatosplenomegaly or hernias noted. Normal bowel sounds, without guarding, and without rebound.   Msk:  Symmetrical without gross  deformities. Normal posture. Pulses:  No carotid, renal, femoral bruits. DP and PT symmetrical and equal Extremities:  Without clubbing or edema. Neurologic:  Alert and  oriented x4;  grossly normal neurologically. Skin:  Intact without significant lesions or rashes. Cervical Nodes:  No significant cervical  adenopathy. Psych:  Alert and cooperative. Normal mood and affect.  Intake/Output from previous day: 10/21 0701 - 10/22 0700 In: 480 [P.O.:480] Out: -  Intake/Output this shift: No intake/output data recorded.  Lab Results: Recent Labs    07/12/21 1851 07/13/21 0311  WBC 13.9* 13.1*  HGB 8.2* 7.2*  HCT 25.7* 22.5*  PLT 141* 123*   BMET Recent Labs    07/12/21 1851 07/13/21 0155 07/13/21 0311  NA 142  --  143  K 4.9  --  4.9  CL 102  --  103  CO2 20*  --  21*  GLUCOSE 149*  --  126*  BUN 110*  --  108*  CREATININE 14.11* 14.60* 14.43*  CALCIUM 9.1  --  9.1   LFT Recent Labs    07/13/21 0311  PROT 6.6  ALBUMIN 2.8*  AST 14*  ALT 14  ALKPHOS 106  BILITOT 1.1   PT/INR Recent Labs    07/12/21 1851  LABPROT 16.9*  INR 1.4*   Hepatitis Panel No results for input(s): HEPBSAG, HCVAB, HEPAIGM, HEPBIGM in the last 72 hours.  Studies/Results: [place] Upper Ext Art Left  Result Date: 07/12/2021 CLINICAL DATA:  Left upper extremity AV fistula for hemodialysis access. Absent rule with tenderness. Evaluate for thrombosis EXAM: LEFT UPPER EXTREMITY ARTERIAL DUPLEX SCAN TECHNIQUE: Gray-scale sonography as well as color Doppler and duplex ultrasound was performed to evaluate the hemodialysis AV fistula. COMPARISON:  None. FINDINGS: Targeted interrogation of the patient's arteriovenous fistula at the elbow. The fistula is thrombosed with near occlusive clot. There is no internal blood flow. No focal fluid collection is seen. The remainder of the arteries in the left upper extremity were not interrogated. IMPRESSION: Thrombosed AV fistula in the left arm. These results were  called by telephone at the time of interpretation on 07/12/2021 at 7:40 pm to provider PHILLIP STAFFORD , who verbally acknowledged these results. Electronically Signed   By: Keith Rake M.D.   On: 07/12/2021 19:41   DG Chest Portable 1 View  Result Date: 07/12/2021 CLINICAL DATA:  Shortness of breath x2 days. EXAM: PORTABLE CHEST 1 VIEW COMPARISON:  July 02, 2021 (8:32 a.m.) FINDINGS: Low lung volumes are seen. Mild, diffusely increased interstitial lung markings are noted. This is stable in appearance when compared to the prior study. There is no evidence of a pleural effusion or pneumothorax. The cardiac silhouette is markedly enlarged. There is marked severity calcification of the thoracic aorta. The visualized skeletal structures are unremarkable. IMPRESSION: 1. Stable cardiomegaly with mild pulmonary edema. Electronically Signed   By: Virgina Norfolk M.D.   On: 07/12/2021 19:20    Assessment/Plan: ESRD-missed dialysis for 1 week.  Nonfunctioning AV fistula.  Vascular surgery called for evaluation and management.  He probably needs a temporary catheter for dialysis today. ANEMIA-we will continue to monitor and follow the anemia protocols while in the hospital. MBD we will check PTH, calcium and phosphorus levels.  Continue Cinacalcet and PhosLo. HTN/VOL will monitor closely. ACCESS nonfunctional AV fistula of the left arm. OTHER respiratory failure with hypoxemia on and off BiPAP.  Case discussed with intensive care staff and will continue to monitor closely. Dialysis orders written on dialysis unit notified.   LOS: [place], MD [facility] @TODAY @10 :27 AM

## 2021-07-13 NOTE — Progress Notes (Signed)
Jordan [name] at [place] NAME: Jordan [name]    MR#:  [id number]  DATE OF BIRTH:  01-25-1966  SUBJECTIVE:  patient has been noncompliant with his dialysis due to transportation issues according to patient's family Belinda. Patient came in with three days of shortness of breath. He missed dialysis a week ago. He has HDK fistula which is nonfunctioning. Got our stat temporary dialysis catheter placed by Dr.antezzana patient very belligerent and using cuss words. He is removing his oxygen. Wants to get out of bed. Confused intermittently becomes hypoxic. He was on BiPAP overnight does not want to place it keeps removing the mask.  REVIEW OF SYSTEMS:   Review of Systems  Unable to perform ROS: Severe respiratory distress  Tolerating Diet: Tolerating PT:   DRUG ALLERGIES:   Allergies  Allergen Reactions   Lisinopril Swelling    VITALS:  Blood pressure 129/86, pulse (!) 112, temperature 98.2 F (36.8 C), temperature source Axillary, resp. rate (!) 25, height 5\' 4"  (1.626 m), weight 75.2 kg, SpO2 100 %.  PHYSICAL EXAMINATION:   Physical Exam  GENERAL:  55 y.o.-year-old patient lying in the bed with  acute distress. Chronically ill very restless HEENT: Head atraumatic, normocephalic. Oropharynx and nasopharynx clear. Puffy face and eyelids LUNGS: decreased breath sounds bilaterally, no wheezing, rales, rhonchi. No use of accessory muscles of respiration.  CARDIOVASCULAR: S1, S2 normal. No murmurs, rubs, or gallops.  ABDOMEN: Soft, nontender, nondistended. Bowel sounds present. No organomegaly or mass.  EXTREMITIES: 1+edema b/l.   Upper extremity AV fistula NEUROLOGIC: moves all extremities well. Very restless agitated  PSYCHIATRIC:  patient is restless and agitated  SKIN: No obvious rash, lesion, or ulcer.   LABORATORY PANEL:  CBC Recent Labs  Lab 07/13/21 0311  WBC 13.1*  HGB 7.2*  HCT 22.5*  PLT 123*    Chemistries  Recent Labs   Lab 07/12/21 1851 07/13/21 0155 07/13/21 0311  NA 142  --  143  K 4.9  --  4.9  CL 102  --  103  CO2 20*  --  21*  GLUCOSE 149*  --  126*  BUN 110*  --  108*  CREATININE 14.11*   < > 14.43*  CALCIUM 9.1  --  9.1  MG 2.6*  --   --   AST 16  --  14*  ALT 16  --  14  ALKPHOS 108  --  106  BILITOT 1.5*  --  1.1   < > = values in this interval not displayed.   Cardiac Enzymes No results for input(s): TROPONINI in the last 168 hours. RADIOLOGY:  [place] Upper Ext Art Left  Result Date: 07/12/2021 CLINICAL DATA:  Left upper extremity AV fistula for hemodialysis access. Absent rule with tenderness. Evaluate for thrombosis EXAM: LEFT UPPER EXTREMITY ARTERIAL DUPLEX SCAN TECHNIQUE: Gray-scale sonography as well as color Doppler and duplex ultrasound was performed to evaluate the hemodialysis AV fistula. COMPARISON:  None. FINDINGS: Targeted interrogation of the patient's arteriovenous fistula at the elbow. The fistula is thrombosed with near occlusive clot. There is no internal blood flow. No focal fluid collection is seen. The remainder of the arteries in the left upper extremity were not interrogated. IMPRESSION: Thrombosed AV fistula in the left arm. These results were called by telephone at the time of interpretation on 07/12/2021 at 7:40 pm to provider Jordan [name] , who verbally acknowledged these results. Electronically Signed   By: Jordan [name].D.  On: 07/12/2021 19:41   DG Chest Portable 1 View  Result Date: 07/12/2021 CLINICAL DATA:  Shortness of breath x2 days. EXAM: PORTABLE CHEST 1 VIEW COMPARISON:  July 02, 2021 (8:32 a.m.) FINDINGS: Low lung volumes are seen. Mild, diffusely increased interstitial lung markings are noted. This is stable in appearance when compared to the prior study. There is no evidence of a pleural effusion or pneumothorax. The cardiac silhouette is markedly enlarged. There is marked severity calcification of the thoracic aorta. The visualized skeletal  structures are unremarkable. IMPRESSION: 1. Stable cardiomegaly with mild pulmonary edema. Electronically Signed   By: Jordan [name] M.D.   On: 07/12/2021 19:20   ASSESSMENT AND PLAN:  Jordan [name] is a 55 y.o. male with medical history significant of end-stage renal disease on hemodialysis Mondays Wednesdays Fridays, alcohol abuse, tobacco abuse, medication noncompliance, COPD, essential hypertension who apparently has been missing hemodialysis quite a lot leading to his fluid overload and being admitted to the hospital.  He did a similar thing earlier this month on October 9.  Patient is here again today after missing hemodialysis for a week.  Extremely agitated hypoxic and tachypneic.    Acute on chronic respiratory failure with hypoxemia suspected due to pulmonary edema from missing dialysis for one week acute metabolic encephalopathy/altered mental status in the setting of hypoxia and uremia due to missing dialysis -- patient now started on prosthetics drip -- very agitated. Received Ativan earlier -- to get dialysis today -- if patient continues to remain agitated and hypoxic will consider mechanical ventilator and intubation to protect airway and get urgent hemodialysis  end-stage renal disease on hemodialysis with noncompliance left AV graft thrombosed per [place] -- patient got temporary HD catheter by vascular surgery to -- nephrology to initiate dialysis  sinus tachycardia suspected of agitation -- continue to monitor  history of tobacco abuse -- nicotine patch  skin lesions suspicious for scabies -- night MD ordered permethrin  Procedures: HD temporary dialysis catheter Family communication : Belinda family. Try to leave message for brother voicemail not set up Consults : nephrology, vascular surgery CODE STATUS:  DVT Prophylaxis : heparin Level of care: Stepdown Status is: Inpatient  Remains inpatient appropriate because: needs urgent dialysis for acute hypoxic  respiratory failure secondary pulmonary edema        TOTAL TIME TAKING CARE OF THIS PATIENT: 35 minutes.  >50% time spent on counselling and coordination of care  Note: This dictation was prepared with Dragon dictation along with smaller phrase technology. Any transcriptional errors that result from this process are unintentional.  Fritzi Mandes M.D    [facility]   CC: Primary care physician; Pcp, No Patient ID: Jordan [name], male   DOB: 20-Dec-1965, 55 y.o.   MRN: [id number]

## 2021-07-13 NOTE — Procedures (Signed)
Central Venous Catheter Insertion Procedure Note  Jordan [name]  [id number]  24-Mar-1966  Date:07/13/21  Time:12:12 PM   Provider Performing:Zenaya Ulatowski Feliberto Gottron   Procedure: Insertion of Non-tunneled Central Venous Catheter(36556)with US guidance (47841)    Indication(s) Hemodialysis  Consent Unable to obtain consent due to emergent nature of procedure.  Anesthesia Topical only with 1% lidocaine   Timeout Verified patient identification, verified procedure, site/side was marked, verified correct patient position, special equipment/implants available, medications/allergies/relevant history reviewed, required imaging and test results available.  Sterile Technique Maximal sterile technique including full sterile barrier drape, hand hygiene, sterile gown, sterile gloves, mask, hair covering, sterile ultrasound probe cover (if used).  Procedure Description Area of catheter insertion was cleaned with chlorhexidine and draped in sterile fashion.   With real-time ultrasound guidance a HD catheter was placed into the right femoral vein.  Nonpulsatile blood flow and easy flushing noted in all ports.  The catheter was sutured in place and sterile dressing applied.  Complications/Tolerance None; patient tolerated the procedure well. Chest X-ray is ordered to verify placement for internal jugular or subclavian cannulation.  Chest x-ray is not ordered for femoral cannulation.  EBL Minimal  Specimen(s) None

## 2021-07-13 NOTE — Progress Notes (Signed)
Hemodialysis:  Patient completed 3.5hours of treatment with 3liters net removal vial temporary right femoral triple lumen catheter. Patient was placed on Levophed prior to start of dialysis due to blood pressure in 70s, primary nurse was able to titrate off prior to completion of dialysis. Patient remained on Precedex drip prior,during, and post ending of dialysis treatment. Albumin was given at start of treatment for blood pressure support, otherwise uneventful treatment.

## 2021-07-13 NOTE — Progress Notes (Signed)
Pt belligerent, cursing at the nurse, making statements such as "I don't care if I die"  Pt keeps pulling off his oxygen.  Nurse continues to put oxygen back on patient and explaining to the patient the importance of wearing his oxygen.

## 2021-07-14 DIAGNOSIS — J9621 Acute and chronic respiratory failure with hypoxia: Secondary | ICD-10-CM | POA: Diagnosis not present

## 2021-07-14 MED ORDER — METOPROLOL TARTRATE 50 MG PO TABS
50.0000 mg | ORAL_TABLET | Freq: Two times a day (BID) | ORAL | Status: DC
  Administered 2021-07-14 – 2021-07-15 (×3): 50 mg via ORAL
  Filled 2021-07-14 (×5): qty 1

## 2021-07-14 MED ORDER — PATIROMER SORBITEX CALCIUM 8.4 G PO PACK
16.8000 g | PACK | Freq: Once | ORAL | Status: AC
  Administered 2021-07-14: 16.8 g via ORAL
  Filled 2021-07-14: qty 2

## 2021-07-14 MED ORDER — DOCUSATE SODIUM 100 MG PO CAPS
100.0000 mg | ORAL_CAPSULE | Freq: Two times a day (BID) | ORAL | Status: DC
  Administered 2021-07-14 – 2021-07-15 (×3): 100 mg via ORAL
  Filled 2021-07-14 (×4): qty 1

## 2021-07-14 MED ORDER — PANTOPRAZOLE SODIUM 20 MG PO TBEC
20.0000 mg | DELAYED_RELEASE_TABLET | Freq: Two times a day (BID) | ORAL | Status: DC
  Administered 2021-07-14: 20 mg via ORAL
  Filled 2021-07-14 (×5): qty 1

## 2021-07-14 MED ORDER — HYDROCOD POLST-CPM POLST ER 10-8 MG/5ML PO SUER
5.0000 mL | Freq: Two times a day (BID) | ORAL | Status: DC | PRN
  Administered 2021-07-14: 5 mL via ORAL
  Filled 2021-07-14: qty 5

## 2021-07-14 MED ORDER — ALBUTEROL SULFATE (2.5 MG/3ML) 0.083% IN NEBU
3.0000 mL | INHALATION_SOLUTION | RESPIRATORY_TRACT | Status: DC | PRN
  Administered 2021-07-15: 3 mL via RESPIRATORY_TRACT
  Filled 2021-07-14: qty 3

## 2021-07-14 MED ORDER — CINACALCET HCL 30 MG PO TABS
30.0000 mg | ORAL_TABLET | Freq: Every day | ORAL | Status: DC
  Filled 2021-07-14 (×2): qty 1

## 2021-07-14 MED ORDER — FERROUS SULFATE 325 (65 FE) MG PO TABS
325.0000 mg | ORAL_TABLET | Freq: Two times a day (BID) | ORAL | Status: DC
  Administered 2021-07-14: 325 mg via ORAL
  Filled 2021-07-14 (×2): qty 1

## 2021-07-14 MED ORDER — GUAIFENESIN ER 600 MG PO TB12
600.0000 mg | ORAL_TABLET | Freq: Two times a day (BID) | ORAL | Status: DC
  Administered 2021-07-14 – 2021-07-15 (×3): 600 mg via ORAL
  Filled 2021-07-14 (×4): qty 1

## 2021-07-14 MED ORDER — CALCIUM ACETATE (PHOS BINDER) 667 MG PO CAPS
1334.0000 mg | ORAL_CAPSULE | Freq: Three times a day (TID) | ORAL | Status: DC
  Administered 2021-07-14 (×2): 1334 mg via ORAL
  Filled 2021-07-14 (×7): qty 2

## 2021-07-14 NOTE — H&P (View-Only) (Signed)
Subjective/Chief Complaint: Patient with angry affect and scratching his lower extremity legs due to severe itching.  He states wanting to be transferred out of the intensive care unit.   Objective: Vital signs in last 24 hours: Temp:  [96.5 F (35.8 C)-97.9 F (36.6 C)] 97.7 F (36.5 C) (10/23 1357) Pulse Rate:  [67-125] 91 (10/23 1357) Resp:  [14-28] 20 (10/23 1357) BP: (101-154)/(70-108) 101/73 (10/23 1357) SpO2:  [84 %-100 %] 100 % (10/23 1215) Weight:  [73.1 kg] 73.1 kg (10/23 0500) Last BM Date: 07/13/21  Intake/Output from previous day: 10/22 0701 - 10/23 0700 In: 521 [P.O.:240; I.V.:281] Out: 3000  Intake/Output this shift: No intake/output data recorded.  General appearance: alert, cooperative, appears older than stated age, fatigued, no distress, mildly obese, and seems to have angry affect Resp: clear to auscultation bilaterally Cardio: regular rate and rhythm, S1, S2 normal, no murmur, click, rub or gallop Extremities: extremities normal, atraumatic, no cyanosis or edema and right femoral dialysis catheter in place and intact. Left arm AV fistula with aneurysmal dilatations without a thrill or bruit.  Lab Results:  Recent Labs    07/13/21 0311 07/13/21 1317  WBC 13.1* 12.4*  HGB 7.2* 6.8*  HCT 22.5* 22.6*  PLT 123* 134*   BMET Recent Labs    07/13/21 0311 07/13/21 1317  NA 143 144  K 4.9 5.6*  CL 103 101  CO2 21* 23  GLUCOSE 126* 105*  BUN 108* 133*  CREATININE 14.43* 14.84*  CALCIUM 9.1 8.9   PT/INR Recent Labs    07/12/21 1851  LABPROT 16.9*  INR 1.4*   ABG No results for input(s): PHART, HCO3 in the last 72 hours.  Invalid input(s): PCO2, PO2  Studies/Results: [place] Upper Ext Art Left  Result Date: 07/12/2021 CLINICAL DATA:  Left upper extremity AV fistula for hemodialysis access. Absent rule with tenderness. Evaluate for thrombosis EXAM: LEFT UPPER EXTREMITY ARTERIAL DUPLEX SCAN TECHNIQUE: Gray-scale sonography as well as color  Doppler and duplex ultrasound was performed to evaluate the hemodialysis AV fistula. COMPARISON:  None. FINDINGS: Targeted interrogation of the patient's arteriovenous fistula at the elbow. The fistula is thrombosed with near occlusive clot. There is no internal blood flow. No focal fluid collection is seen. The remainder of the arteries in the left upper extremity were not interrogated. IMPRESSION: Thrombosed AV fistula in the left arm. These results were called by telephone at the time of interpretation on 07/12/2021 at 7:40 pm to provider PHILLIP STAFFORD , who verbally acknowledged these results. Electronically Signed   By: Keith Rake M.D.   On: 07/12/2021 19:41   DG Chest Portable 1 View  Result Date: 07/12/2021 CLINICAL DATA:  Shortness of breath x2 days. EXAM: PORTABLE CHEST 1 VIEW COMPARISON:  July 02, 2021 (8:32 a.m.) FINDINGS: Low lung volumes are seen. Mild, diffusely increased interstitial lung markings are noted. This is stable in appearance when compared to the prior study. There is no evidence of a pleural effusion or pneumothorax. The cardiac silhouette is markedly enlarged. There is marked severity calcification of the thoracic aorta. The visualized skeletal structures are unremarkable. IMPRESSION: 1. Stable cardiomegaly with mild pulmonary edema. Electronically Signed   By: Virgina Norfolk M.D.   On: 07/12/2021 19:20    Anti-infectives: Anti-infectives (From admission, onward)    None       Assessment/Plan: s/p * No surgery found * I will schedule him for a left arm AV fistulogram possible lysis and stenting. The patient understands all the benefits and  risks of this procedure and wishes to proceed.  LOS: 2 days    Elmore Guise 07/14/2021

## 2021-07-14 NOTE — Progress Notes (Signed)
Subjective/Chief Complaint: Patient with angry affect and scratching his lower extremity legs due to severe itching.  He states wanting to be transferred out of the intensive care unit.   Objective: Vital signs in last 24 hours: Temp:  [96.5 F (35.8 C)-97.9 F (36.6 C)] 97.7 F (36.5 C) (10/23 1357) Pulse Rate:  [67-125] 91 (10/23 1357) Resp:  [14-28] 20 (10/23 1357) BP: (101-154)/(70-108) 101/73 (10/23 1357) SpO2:  [84 %-100 %] 100 % (10/23 1215) Weight:  [73.1 kg] 73.1 kg (10/23 0500) Last BM Date: 07/13/21  Intake/Output from previous day: 10/22 0701 - 10/23 0700 In: 521 [P.O.:240; I.V.:281] Out: 3000  Intake/Output this shift: No intake/output data recorded.  General appearance: alert, cooperative, appears older than stated age, fatigued, no distress, mildly obese, and seems to have angry affect Resp: clear to auscultation bilaterally Cardio: regular rate and rhythm, S1, S2 normal, no murmur, click, rub or gallop Extremities: extremities normal, atraumatic, no cyanosis or edema and right femoral dialysis catheter in place and intact. Left arm AV fistula with aneurysmal dilatations without a thrill or bruit.  Lab Results:  Recent Labs    07/13/21 0311 07/13/21 1317  WBC 13.1* 12.4*  HGB 7.2* 6.8*  HCT 22.5* 22.6*  PLT 123* 134*   BMET Recent Labs    07/13/21 0311 07/13/21 1317  NA 143 144  K 4.9 5.6*  CL 103 101  CO2 21* 23  GLUCOSE 126* 105*  BUN 108* 133*  CREATININE 14.43* 14.84*  CALCIUM 9.1 8.9   PT/INR Recent Labs    07/12/21 1851  LABPROT 16.9*  INR 1.4*   ABG No results for input(s): PHART, HCO3 in the last 72 hours.  Invalid input(s): PCO2, PO2  Studies/Results: [place] Upper Ext Art Left  Result Date: 07/12/2021 CLINICAL DATA:  Left upper extremity AV fistula for hemodialysis access. Absent rule with tenderness. Evaluate for thrombosis EXAM: LEFT UPPER EXTREMITY ARTERIAL DUPLEX SCAN TECHNIQUE: Gray-scale sonography as well as color  Doppler and duplex ultrasound was performed to evaluate the hemodialysis AV fistula. COMPARISON:  None. FINDINGS: Targeted interrogation of the patient's arteriovenous fistula at the elbow. The fistula is thrombosed with near occlusive clot. There is no internal blood flow. No focal fluid collection is seen. The remainder of the arteries in the left upper extremity were not interrogated. IMPRESSION: Thrombosed AV fistula in the left arm. These results were called by telephone at the time of interpretation on 07/12/2021 at 7:40 pm to provider PHILLIP STAFFORD , who verbally acknowledged these results. Electronically Signed   By: Keith Rake M.D.   On: 07/12/2021 19:41   DG Chest Portable 1 View  Result Date: 07/12/2021 CLINICAL DATA:  Shortness of breath x2 days. EXAM: PORTABLE CHEST 1 VIEW COMPARISON:  July 02, 2021 (8:32 a.m.) FINDINGS: Low lung volumes are seen. Mild, diffusely increased interstitial lung markings are noted. This is stable in appearance when compared to the prior study. There is no evidence of a pleural effusion or pneumothorax. The cardiac silhouette is markedly enlarged. There is marked severity calcification of the thoracic aorta. The visualized skeletal structures are unremarkable. IMPRESSION: 1. Stable cardiomegaly with mild pulmonary edema. Electronically Signed   By: Virgina Norfolk M.D.   On: 07/12/2021 19:20    Anti-infectives: Anti-infectives (From admission, onward)    None       Assessment/Plan: s/p * No surgery found * I will schedule him for a left arm AV fistulogram possible lysis and stenting. The patient understands all the benefits and  risks of this procedure and wishes to proceed.  LOS: 2 days    Elmore Guise 07/14/2021

## 2021-07-14 NOTE — Progress Notes (Signed)
Jordan [name] at [place] NAME: Jordan [name]    MR#:  [id number]  DATE OF BIRTH:  1966/09/09  SUBJECTIVE:  patient has been noncompliant with his dialysis due to transportation issues according to patient's family Jordan [name]. Patient got HD yesterday with ultrafiltration of 3 L. Much stable today. REVIEW OF SYSTEMS:   Review of Systems  Unable to perform ROS: Severe respiratory distress  Tolerating Diet: Tolerating PT:   DRUG ALLERGIES:   Allergies  Allergen Reactions   Lisinopril Swelling    VITALS:  Blood pressure 109/78, pulse 87, temperature 97.7 F (36.5 C), temperature source Oral, resp. rate (!) 24, height 5\' 4"  (1.626 m), weight 73.1 kg, SpO2 100 %.  PHYSICAL EXAMINATION:   Physical Exam  GENERAL:  55 y.o.-year-old patient lying in the bed with  acute distress. Chronically ill very restless HEENT: Head atraumatic, normocephalic. Oropharynx and nasopharynx clear. Puffy face and eyelids LUNGS: decreased breath sounds bilaterally, no wheezing, rales, rhonchi. No use of accessory muscles of respiration.  CARDIOVASCULAR: S1, S2 normal. No murmurs, rubs, or gallops.  ABDOMEN: Soft, nontender, nondistended. Bowel sounds present. No organomegaly or mass.  EXTREMITIES: 1+edema b/l.   Upper extremity AV fistula NEUROLOGIC: moves all extremities well. Very restless agitated  PSYCHIATRIC:  patient is restless and agitated  SKIN: No obvious rash, lesion, or ulcer.   LABORATORY PANEL:  CBC Recent Labs  Lab 07/13/21 1317  WBC 12.4*  HGB 6.8*  HCT 22.6*  PLT 134*     Chemistries  Recent Labs  Lab 07/12/21 1851 07/13/21 0155 07/13/21 0311 07/13/21 1317  NA 142  --  143 144  K 4.9  --  4.9 5.6*  CL 102  --  103 101  CO2 20*  --  21* 23  GLUCOSE 149*  --  126* 105*  BUN 110*  --  108* 133*  CREATININE 14.11*   < > 14.43* 14.84*  CALCIUM 9.1  --  9.1 8.9  MG 2.6*  --   --   --   AST 16  --  14*  --   ALT 16  --  14  --    ALKPHOS 108  --  106  --   BILITOT 1.5*  --  1.1  --    < > = values in this interval not displayed.    Cardiac Enzymes No results for input(s): TROPONINI in the last 168 hours. RADIOLOGY:  [place] Upper Ext Art Left  Result Date: 07/12/2021 CLINICAL DATA:  Left upper extremity AV fistula for hemodialysis access. Absent rule with tenderness. Evaluate for thrombosis EXAM: LEFT UPPER EXTREMITY ARTERIAL DUPLEX SCAN TECHNIQUE: Gray-scale sonography as well as color Doppler and duplex ultrasound was performed to evaluate the hemodialysis AV fistula. COMPARISON:  None. FINDINGS: Targeted interrogation of the patient's arteriovenous fistula at the elbow. The fistula is thrombosed with near occlusive clot. There is no internal blood flow. No focal fluid collection is seen. The remainder of the arteries in the left upper extremity were not interrogated. IMPRESSION: Thrombosed AV fistula in the left arm. These results were called by telephone at the time of interpretation on 07/12/2021 at 7:40 pm to provider Jordan [name] , who verbally acknowledged these results. Electronically Signed   By: Jordan [name] M.D.   On: 07/12/2021 19:41   DG Chest Portable 1 View  Result Date: 07/12/2021 CLINICAL DATA:  Shortness of breath x2 days. EXAM: PORTABLE CHEST 1 VIEW COMPARISON:  July 02, 2021 (  8:32 a.m.) FINDINGS: Low lung volumes are seen. Mild, diffusely increased interstitial lung markings are noted. This is stable in appearance when compared to the prior study. There is no evidence of a pleural effusion or pneumothorax. The cardiac silhouette is markedly enlarged. There is marked severity calcification of the thoracic aorta. The visualized skeletal structures are unremarkable. IMPRESSION: 1. Stable cardiomegaly with mild pulmonary edema. Electronically Signed   By: Jordan [name] M.D.   On: 07/12/2021 19:20   ASSESSMENT AND PLAN:  Jordan [name] is a 55 y.o. male with medical history significant of  end-stage renal disease on hemodialysis Mondays Wednesdays Fridays, alcohol abuse, tobacco abuse, medication noncompliance, COPD, essential hypertension who apparently has been missing hemodialysis quite a lot leading to his fluid overload and being admitted to the hospital.  He did a similar thing earlier this month on October 9.  Patient is here again today after missing hemodialysis for a week.  Extremely agitated hypoxic and tachypneic.    Acute on chronic respiratory failure with hypoxemia suspected due to pulmonary edema from missing dialysis for one week acute metabolic encephalopathy/altered mental status in the setting of hypoxia and uremia due to missing dialysis -- patient now started on prosthetics drip -- very agitated. Received Ativan earlier -- to get dialysis today -- if patient continues to remain agitated and hypoxic will consider mechanical ventilator and intubation to protect airway and get urgent hemodialysis --10/23--HD with UF 3 liters yday --K 5.6--give veltassa.   Anemia of chronic disease in the setting of end-stage renal disease -- no active bleeding. Hemoglobin 6.8 billion. Will recheck hemoglobin tomorrow and transfuse a dialysis if needed  end-stage renal disease on hemodialysis with noncompliance left AV graft thrombosed per [place] -- patient got temporary HD catheter by vascular surgery to -- nephrology to initiate dialysis  sinus tachycardia suspected of agitation -- continue to monitor  history of tobacco abuse -- nicotine patch  skin lesions suspicious for scabies -- treated with permethrin 10/21  Procedures: HD temporary dialysis catheter Family communication : Jordan [name] family. Try to leave message for brother voicemail not set up Consults : nephrology, vascular surgery CODE STATUS:  DVT Prophylaxis : heparin Level of care: Med-Surg Status is: Inpatient  Remains inpatient appropriate because: needs to fix the dialysis access prior to discharge       TOTAL TIME TAKING CARE OF THIS PATIENT: 25 minutes.  >50% time spent on counselling and coordination of care  Note: This dictation was prepared with Dragon dictation along with smaller phrase technology. Any transcriptional errors that result from this process are unintentional.  Jordan Mandes M.D    [facility]   CC: Primary care physician; Pcp, No Patient ID: Jordan [name], male   DOB: 02/08/1966, 55 y.o.   MRN: [id number]

## 2021-07-14 NOTE — Progress Notes (Signed)
Patient is refusing telemetry.

## 2021-07-14 NOTE — Progress Notes (Signed)
[facility]  PROGRESS NOTE   Subjective:   Patient seen at bedside.  Able to answer questions.  Denies any chest pain or shortness of breath  Objective:  Vital signs in last 24 hours:  Temp:  [96.5 F (35.8 C)-98.2 F (36.8 C)] 97.9 F (36.6 C) (10/23 0400) Pulse Rate:  [25-125] 109 (10/23 0915) Resp:  [14-28] 19 (10/23 0915) BP: (77-154)/(53-108) 111/78 (10/23 0915) SpO2:  [87 %-100 %] 99 % (10/23 0915) Weight:  [73.1 kg-75.2 kg] 73.1 kg (10/23 0500)  Weight change: -12.8 kg Filed Weights   07/13/21 0500 07/13/21 1300 07/14/21 0500  Weight: 75.2 kg 75.2 kg 73.1 kg    Intake/Output: I/O last 3 completed shifts: In: 1001 [P.O.:720; I.V.:281] Out: 3000 [Other:3000]   Intake/Output this shift:  No intake/output data recorded.  Physical Exam: General:  No acute distress  Head:  Normocephalic, atraumatic. Moist oral mucosal membranes  Eyes:  Anicteric  Neck:  Supple  Lungs:   Clear to auscultation, normal effort  Heart:  S1S2 no rubs  Abdomen:   Soft, nontender, bowel sounds present  Extremities:  peripheral edema.  Neurologic:  Awake, alert, following commands  Skin:  No lesions  Access:     Basic Metabolic Panel: Recent Labs  Lab 07/12/21 1851 07/13/21 0155 07/13/21 0311 07/13/21 1317  NA 142  --  143 144  K 4.9  --  4.9 5.6*  CL 102  --  103 101  CO2 20*  --  21* 23  GLUCOSE 149*  --  126* 105*  BUN 110*  --  108* 133*  CREATININE 14.11* 14.60* 14.43* 14.84*  CALCIUM 9.1  --  9.1 8.9  MG 2.6*  --   --   --   PHOS  --   --   --  9.6*    CBC: Recent Labs  Lab 07/12/21 1851 07/13/21 0311 07/13/21 1317  WBC 13.9* 13.1* 12.4*  NEUTROABS 12.3*  --   --   HGB 8.2* 7.2* 6.8*  HCT 25.7* 22.5* 22.6*  MCV 98.1 97.8 98.7  PLT 141* 123* 134*     Urinalysis: No results for input(s): COLORURINE, LABSPEC, PHURINE, GLUCOSEU, HGBUR, BILIRUBINUR, KETONESUR, PROTEINUR, UROBILINOGEN, NITRITE, LEUKOCYTESUR in the last 72 hours.  Invalid  input(s): APPERANCEUR    Imaging: [place] Upper Ext Art Left  Result Date: 07/12/2021 CLINICAL DATA:  Left upper extremity AV fistula for hemodialysis access. Absent rule with tenderness. Evaluate for thrombosis EXAM: LEFT UPPER EXTREMITY ARTERIAL DUPLEX SCAN TECHNIQUE: Gray-scale sonography as well as color Doppler and duplex ultrasound was performed to evaluate the hemodialysis AV fistula. COMPARISON:  None. FINDINGS: Targeted interrogation of the patient's arteriovenous fistula at the elbow. The fistula is thrombosed with near occlusive clot. There is no internal blood flow. No focal fluid collection is seen. The remainder of the arteries in the left upper extremity were not interrogated. IMPRESSION: Thrombosed AV fistula in the left arm. These results were called by telephone at the time of interpretation on 07/12/2021 at 7:40 pm to provider PHILLIP STAFFORD , who verbally acknowledged these results. Electronically Signed   By: Keith Rake M.D.   On: 07/12/2021 19:41   DG Chest Portable 1 View  Result Date: 07/12/2021 CLINICAL DATA:  Shortness of breath x2 days. EXAM: PORTABLE CHEST 1 VIEW COMPARISON:  July 02, 2021 (8:32 a.m.) FINDINGS: Low lung volumes are seen. Mild, diffusely increased interstitial lung markings are noted. This is stable in appearance when compared to the prior study. There is  no evidence of a pleural effusion or pneumothorax. The cardiac silhouette is markedly enlarged. There is marked severity calcification of the thoracic aorta. The visualized skeletal structures are unremarkable. IMPRESSION: 1. Stable cardiomegaly with mild pulmonary edema. Electronically Signed   By: Virgina Norfolk M.D.   On: 07/12/2021 19:20     Medications:    sodium chloride     sodium chloride      calcium acetate  1,334 mg Oral TID WC   Chlorhexidine Gluconate Cloth  6 each Topical Q0600   Chlorhexidine Gluconate Cloth  6 each Topical Daily   [START ON 07/15/2021] cinacalcet  30 mg Oral  Q breakfast   docusate sodium  100 mg Oral BID   ferrous sulfate  325 mg Oral BID WC   guaiFENesin  600 mg Oral BID   heparin  5,000 Units Subcutaneous Q8H   metoprolol tartrate  50 mg Oral BID   nicotine  21 mg Transdermal Daily   pantoprazole  20 mg Oral BID AC    Assessment/ Plan:     Principal Problem:   Acute on chronic respiratory failure with hypoxemia (HCC) Active Problems:   Tobacco abuse   ESRD on dialysis ([place])   Essential hypertension   Anemia in ESRD (end-stage renal disease) (HCC)   Moderate protein-calorie malnutrition (HCC)   Acute on chronic combined systolic and diastolic CHF (congestive heart failure) (HCC)   PAF (paroxysmal atrial fibrillation) (HCC)   Alcohol abuse   COPD (chronic obstructive pulmonary disease) ([place])  55 year old male with history of hypertension, coronary artery disease, smoking, COPD, congestive heart failure, end-stage renal disease on dialysis now comes into the emergency room last night after missing 1 week of dialysis.     #1: End-stage renal disease: Patient with missed dialysis treatment.  Clotted AV fistula.  Now has a temporary catheter.  We will schedule for dialysis again tomorrow.  #2: Hypertension/congestive heart failure: Patient is advised on the importance of fluid restriction.  #3: Anemia: We will repeat blood work today.  If  the hemoglobin is lower he may need blood transfusion.  #4: Hemodialysis access: Spoke to vascular regarding possible fistulogram versus placement of permacath.  We will continue to follow.   LOS: [place], MD [facility] 10/23/202212:32 PM

## 2021-07-15 ENCOUNTER — Inpatient Hospital Stay: Payer: Medicare Other

## 2021-07-15 ENCOUNTER — Other Ambulatory Visit (INDEPENDENT_AMBULATORY_CARE_PROVIDER_SITE_OTHER): Payer: Self-pay | Admitting: Vascular Surgery

## 2021-07-15 ENCOUNTER — Encounter
Admission: EM | Discharge status: Against Medical Advice | Payer: Self-pay | Source: Home / Self Care | Attending: Internal Medicine

## 2021-07-15 DIAGNOSIS — T82868A Thrombosis of vascular prosthetic devices, implants and grafts, initial encounter: Secondary | ICD-10-CM

## 2021-07-15 DIAGNOSIS — N186 End stage renal disease: Secondary | ICD-10-CM

## 2021-07-15 DIAGNOSIS — J9621 Acute and chronic respiratory failure with hypoxia: Secondary | ICD-10-CM | POA: Diagnosis not present

## 2021-07-15 DIAGNOSIS — L8990 Pressure ulcer of unspecified site, unspecified stage: Secondary | ICD-10-CM | POA: Insufficient documentation

## 2021-07-15 DIAGNOSIS — Y832 Surgical operation with anastomosis, bypass or graft as the cause of abnormal reaction of the patient, or of later complication, without mention of misadventure at the time of the procedure: Secondary | ICD-10-CM

## 2021-07-15 HISTORY — PX: A/V FISTULAGRAM: CATH118298

## 2021-07-15 LAB — BASIC METABOLIC PANEL
Anion gap: 21 — ABNORMAL HIGH (ref 5–15)
BUN: 85 mg/dL — ABNORMAL HIGH (ref 6–20)
CO2: 18 mmol/L — ABNORMAL LOW (ref 22–32)
Calcium: 8.6 mg/dL — ABNORMAL LOW (ref 8.9–10.3)
Chloride: 100 mmol/L (ref 98–111)
Creatinine, Ser: 10.01 mg/dL — ABNORMAL HIGH (ref 0.61–1.24)
GFR, Estimated: 6 mL/min — ABNORMAL LOW (ref >60)
Glucose, Bld: 61 mg/dL — ABNORMAL LOW (ref 70–99)
Potassium: 4.3 mmol/L (ref 3.5–5.1)
Sodium: 139 mmol/L (ref 135–145)

## 2021-07-15 LAB — RENAL FUNCTION PANEL
Albumin: 2.7 g/dL — ABNORMAL LOW (ref 3.5–5.0)
Anion gap: 19 — ABNORMAL HIGH (ref 5–15)
BUN: 88 mg/dL — ABNORMAL HIGH (ref 6–20)
CO2: 20 mmol/L — ABNORMAL LOW (ref 22–32)
Calcium: 8.6 mg/dL — ABNORMAL LOW (ref 8.9–10.3)
Chloride: 101 mmol/L (ref 98–111)
Creatinine, Ser: 10.25 mg/dL — ABNORMAL HIGH (ref 0.61–1.24)
GFR, Estimated: 6 mL/min — ABNORMAL LOW (ref >60)
Glucose, Bld: 71 mg/dL (ref 70–99)
Phosphorus: 6.6 mg/dL — ABNORMAL HIGH (ref 2.5–4.6)
Potassium: 4.1 mmol/L (ref 3.5–5.1)
Sodium: 140 mmol/L (ref 135–145)

## 2021-07-15 LAB — CBC
HCT: 26.8 % — ABNORMAL LOW (ref 39.0–52.0)
Hemoglobin: 8.4 g/dL — ABNORMAL LOW (ref 13.0–17.0)
MCH: 31.6 pg (ref 26.0–34.0)
MCHC: 31.3 g/dL (ref 30.0–36.0)
MCV: 100.8 fL — ABNORMAL HIGH (ref 80.0–100.0)
Platelets: 133 10*3/uL — ABNORMAL LOW (ref 150–400)
RBC: 2.66 MIL/uL — ABNORMAL LOW (ref 4.22–5.81)
RDW: 16.3 % — ABNORMAL HIGH (ref 11.5–15.5)
WBC: 16.8 10*3/uL — ABNORMAL HIGH (ref 4.0–10.5)
nRBC: 0 % (ref 0.0–0.2)

## 2021-07-15 LAB — HEMOGLOBIN: Hemoglobin: 7 g/dL — ABNORMAL LOW (ref 13.0–17.0)

## 2021-07-15 LAB — TROPONIN I (HIGH SENSITIVITY)
Troponin I (High Sensitivity): 167 ng/L (ref <18)
Troponin I (High Sensitivity): 178 ng/L (ref <18)

## 2021-07-15 LAB — SURGICAL PCR SCREEN
MRSA, PCR: NEGATIVE
Staphylococcus aureus: NEGATIVE

## 2021-07-15 IMAGING — DX DG CHEST 1V PORT
1 series · 1 of 1 positions shown · non-contrast
Comparison: [DATE], CT [DATE], chest x-ray [DATE],
[DATE], [DATE], [DATE]

CLINICAL DATA: Chest pain

EXAM:
PORTABLE CHEST 1 VIEW

[chest ap]
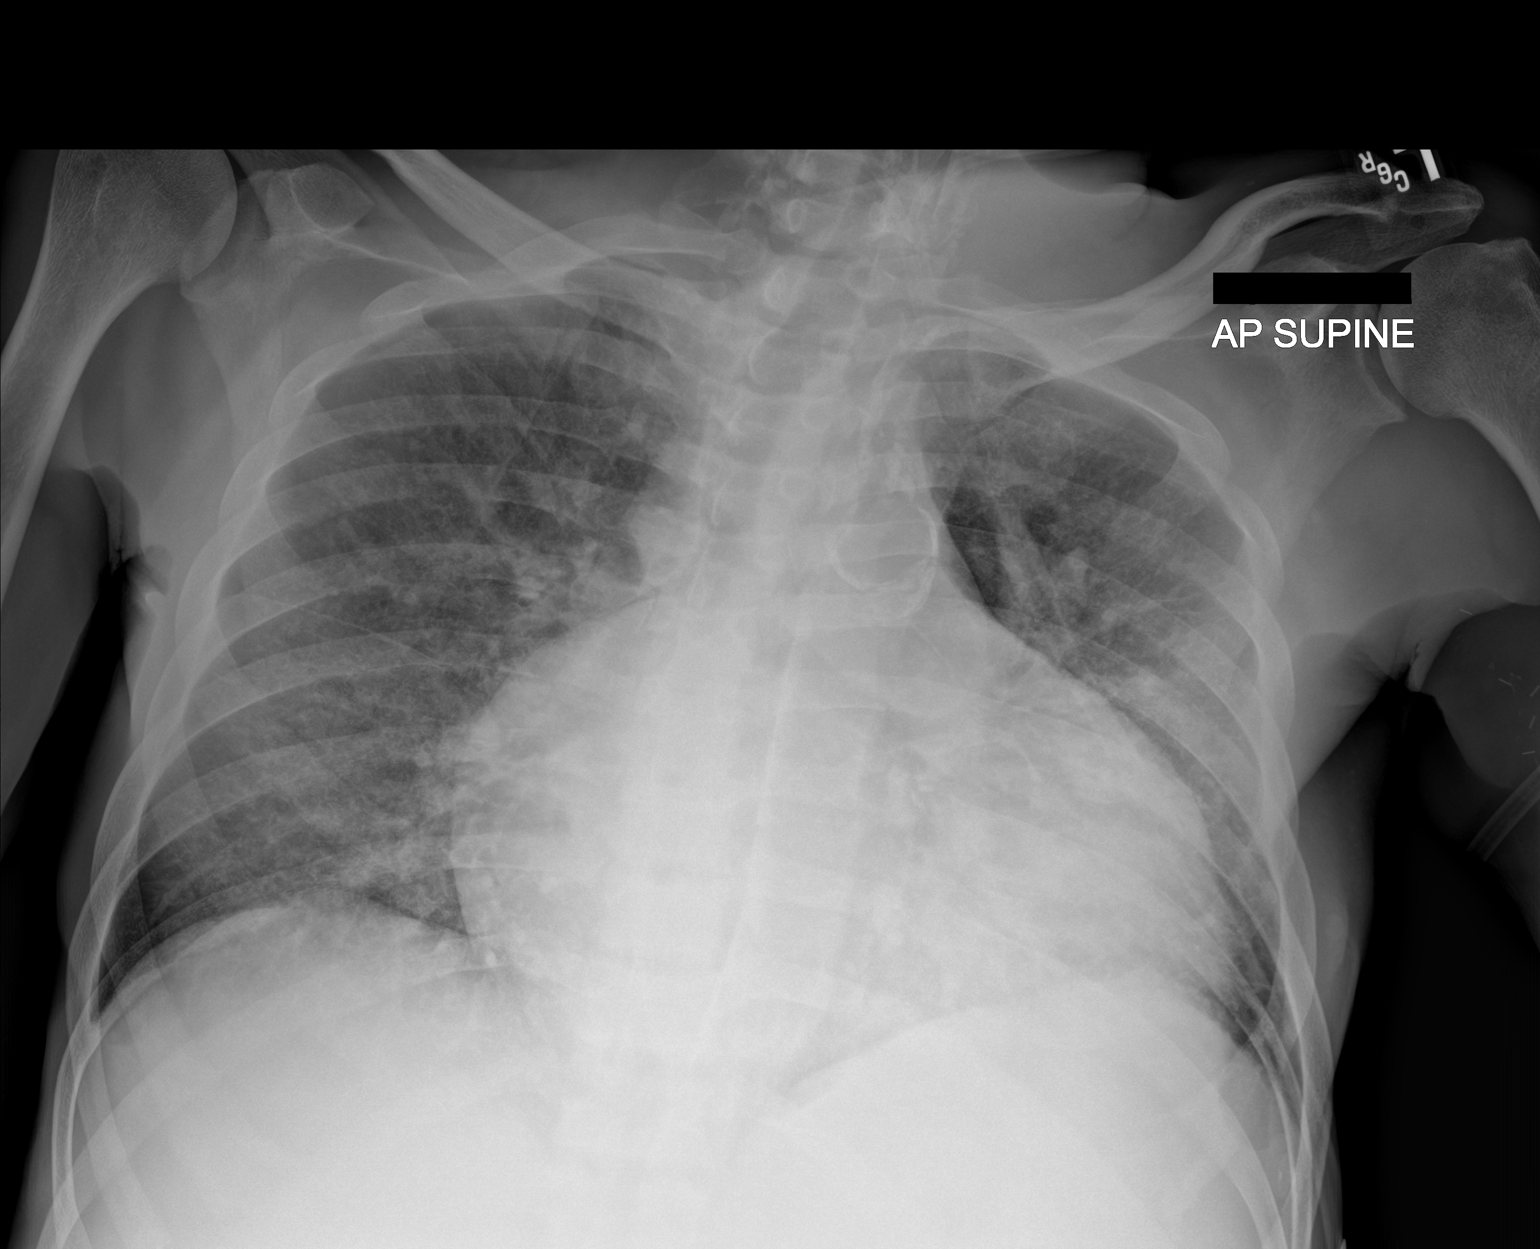

[1 of 1 positions shown; findings below may reference images not displayed]

FINDINGS: Cardiomegaly with globular cardiac configuration. Aortic
atherosclerosis. Diffuse bilateral ground-glass opacity. No pleural
effusion or pneumothorax.
IMPRESSION: 1. Cardiomegaly with globular cardiac configuration which may be due
to combination of chamber enlargement and pericardial effusion.
2. Diffuse bilateral ground-glass opacity, suspect underlying
component of chronic lung disease but difficult to exclude mild
superimposed edema

## 2021-07-15 SURGERY — A/V FISTULAGRAM
Anesthesia: Moderate Sedation | Laterality: Left

## 2021-07-15 MED ORDER — CEFAZOLIN SODIUM-DEXTROSE 1-4 GM/50ML-% IV SOLN
INTRAVENOUS | Status: AC
  Administered 2021-07-15: 1 g via INTRAVENOUS
  Filled 2021-07-15: qty 50

## 2021-07-15 MED ORDER — DIPHENHYDRAMINE HCL 50 MG/ML IJ SOLN: 50.0000 mg | Freq: Once | INTRAMUSCULAR | Status: DC | PRN

## 2021-07-15 MED ORDER — CEFAZOLIN SODIUM-DEXTROSE 1-4 GM/50ML-% IV SOLN: 1.0000 g | Freq: Once | INTRAVENOUS | Status: AC

## 2021-07-15 MED ORDER — FENTANYL CITRATE PF 50 MCG/ML IJ SOSY
PREFILLED_SYRINGE | INTRAMUSCULAR | Status: AC
  Filled 2021-07-15: qty 2

## 2021-07-15 MED ORDER — LIDOCAINE HCL (PF) 1 % IJ SOLN
5.0000 mL | INTRAMUSCULAR | Status: DC | PRN
  Filled 2021-07-15: qty 5

## 2021-07-15 MED ORDER — METHYLPREDNISOLONE SODIUM SUCC 125 MG IJ SOLR: 125.0000 mg | Freq: Once | INTRAMUSCULAR | Status: DC | PRN

## 2021-07-15 MED ORDER — FAMOTIDINE 20 MG PO TABS: 40.0000 mg | ORAL_TABLET | Freq: Once | ORAL | Status: DC | PRN

## 2021-07-15 MED ORDER — MIDAZOLAM HCL 2 MG/ML PO SYRP: 8.0000 mg | ORAL_SOLUTION | Freq: Once | ORAL | Status: DC | PRN

## 2021-07-15 MED ORDER — HEPARIN SODIUM (PORCINE) 1000 UNIT/ML DIALYSIS
1000.0000 [IU] | INTRAMUSCULAR | Status: DC | PRN
  Filled 2021-07-15: qty 1

## 2021-07-15 MED ORDER — ONDANSETRON HCL 4 MG/2ML IJ SOLN: 4.0000 mg | Freq: Four times a day (QID) | INTRAMUSCULAR | Status: DC | PRN

## 2021-07-15 MED ORDER — SODIUM CHLORIDE 0.9 % IV SOLN: 100.0000 mL | INTRAVENOUS | Status: DC | PRN

## 2021-07-15 MED ORDER — LIDOCAINE-PRILOCAINE 2.5-2.5 % EX CREA
1.0000 "application " | TOPICAL_CREAM | CUTANEOUS | Status: DC | PRN
  Filled 2021-07-15: qty 5

## 2021-07-15 MED ORDER — SODIUM CHLORIDE 0.9 % IV SOLN: INTRAVENOUS | Status: DC

## 2021-07-15 MED ORDER — HEPARIN SODIUM (PORCINE) 1000 UNIT/ML IJ SOLN
INTRAMUSCULAR | Status: AC
  Filled 2021-07-15: qty 1

## 2021-07-15 MED ORDER — MIDAZOLAM HCL 2 MG/2ML IJ SOLN
INTRAMUSCULAR | Status: DC | PRN
  Administered 2021-07-15: 2 mg via INTRAVENOUS

## 2021-07-15 MED ORDER — PENTAFLUOROPROP-TETRAFLUOROETH EX AERO
1.0000 "application " | INHALATION_SPRAY | CUTANEOUS | Status: DC | PRN
  Filled 2021-07-15: qty 30

## 2021-07-15 MED ORDER — FENTANYL CITRATE (PF) 100 MCG/2ML IJ SOLN
INTRAMUSCULAR | Status: DC | PRN
  Administered 2021-07-15: 50 ug via INTRAVENOUS

## 2021-07-15 MED ORDER — HYDROMORPHONE HCL 1 MG/ML IJ SOLN
1.0000 mg | Freq: Once | INTRAMUSCULAR | Status: DC | PRN
Start: 2021-07-15 — End: 2021-07-16

## 2021-07-15 MED ORDER — ALTEPLASE 2 MG IJ SOLR
2.0000 mg | Freq: Once | INTRAMUSCULAR | Status: DC | PRN
Start: 2021-07-15 — End: 2021-07-16
  Filled 2021-07-15: qty 2

## 2021-07-15 MED ORDER — MIDAZOLAM HCL 5 MG/5ML IJ SOLN
INTRAMUSCULAR | Status: AC
  Filled 2021-07-15: qty 5

## 2021-07-15 MED ORDER — CHLORHEXIDINE GLUCONATE CLOTH 2 % EX PADS: 6.0000 | MEDICATED_PAD | Freq: Every day | CUTANEOUS | Status: DC

## 2021-07-15 MED ORDER — SODIUM CHLORIDE 0.9 % IV SOLN
100.0000 mL | INTRAVENOUS | Status: DC | PRN
Start: 2021-07-15 — End: 2021-07-16

## 2021-07-15 SURGICAL SUPPLY — 5 items
CANNULA 5F STIFF (CANNULA) ×1 IMPLANT
DRAPE BRACHIAL (DRAPES) ×1 IMPLANT
PACK ANGIOGRAPHY (CUSTOM PROCEDURE TRAY) ×2 IMPLANT
SHEATH BRITE TIP 6FRX5.5 (SHEATH) ×1 IMPLANT
SUT MNCRL AB 4-0 PS2 18 (SUTURE) ×1 IMPLANT

## 2021-07-15 NOTE — Progress Notes (Signed)
Patient is refusing to have 0400 vital signs taken. Attempts were made x 3 by myself and the tech, [place]. Also refusing to have MRSA screening completed. Dr. Sidney Ace informed.

## 2021-07-15 NOTE — Progress Notes (Signed)
Hemodialysis patient known at [facility] MWF 11:50. Patient used to ride [facility] however due to no call no show patient was discharged from their services. Per [place] patient is known to miss several treatments. Last treatment was in September, regardless clinic has not discharged patient and willing to accept back upon discharge. Please contact me with any dialysis placement concerns.  Elvera Bicker Dialysis Coordinator [phone]

## 2021-07-15 NOTE — Progress Notes (Signed)
[facility]  ROUNDING NOTE   Subjective:   [name] was admitted to [facility] : [facility] on 07/12/2021 for Acute respiratory failure with hypoxia ([place]) [J96.01] ESRD on hemodialysis ([place]) [N18.6, Z99.2] AV fistula occlusion, initial encounter ([place]) [T82.898A] Acute on chronic respiratory failure with hypoxemia ([place]) [J96.21]   Patient seen prior to initiating dialysis Alert Room air Left upper access arm edematous, painful Unable to perform dialysis  Objective:  Vital signs in last 24 hours:  Temp:  [97.7 F (36.5 C)-98.2 F (36.8 C)] 98.2 F (36.8 C) (10/24 0840) Pulse Rate:  [81-99] 94 (10/24 1100) Resp:  [13-24] 20 (10/24 1100) BP: (79-131)/(49-86) 94/66 (10/24 1100) SpO2:  [90 %-100 %] 100 % (10/24 1100) Weight:  [70.6 kg] 70.6 kg (10/24 0958)  Weight change:  Filed Weights   07/13/21 1300 07/14/21 0500 07/15/21 0958  Weight: 75.2 kg 73.1 kg 70.6 kg    Intake/Output: I/O last 3 completed shifts: In: 508.5 [P.O.:240; I.V.:268.5] Out: 0    Intake/Output this shift:  No intake/output data recorded.  Physical Exam: General: NAD  Head: Moist oral mucus membranes  Eyes: Anicteric  Lungs:  Remains diminished bilaterally, 3L [place] O2  Heart: Regular rate and rhythm   Abdomen:  Soft, nontender, distended  Extremities:  no peripheral edema.  Neurologic: Alert and oriented  Skin: Left upper arm edematous, painful to touch, erythema  Access: Left aneurysmal AVF    Basic Metabolic Panel: Recent Labs  Lab 07/12/21 1851 07/13/21 0155 07/13/21 0311 07/13/21 1317  NA 142  --  143 144  K 4.9  --  4.9 5.6*  CL 102  --  103 101  CO2 20*  --  21* 23  GLUCOSE 149*  --  126* 105*  BUN 110*  --  108* 133*  CREATININE 14.11* 14.60* 14.43* 14.84*  CALCIUM 9.1  --  9.1 8.9  MG 2.6*  --   --   --   PHOS  --   --   --  9.6*     Liver Function Tests: Recent Labs  Lab 07/12/21 1851 07/13/21 0311 07/13/21 1317  AST 16 14*  --   ALT 16 14  --   ALKPHOS 108  106  --   BILITOT 1.5* 1.1  --   PROT 8.0 6.6  --   ALBUMIN 3.2* 2.8* 2.7*    Recent Labs  Lab 07/12/21 1851  LIPASE 34   No results for input(s): AMMONIA in the last 168 hours.  CBC: Recent Labs  Lab 07/12/21 1851 07/13/21 0311 07/13/21 1317  WBC 13.9* 13.1* 12.4*  NEUTROABS 12.3*  --   --   HGB 8.2* 7.2* 6.8*  HCT 25.7* 22.5* 22.6*  MCV 98.1 97.8 98.7  PLT 141* 123* 134*     Cardiac Enzymes: No results for input(s): CKTOTAL, CKMB, CKMBINDEX, TROPONINI in the last 168 hours.  BNP: Invalid input(s): POCBNP  CBG: Recent Labs  Lab 07/13/21 0144  GLUCAP 91     Microbiology: Results for orders placed or performed during the hospital encounter of 07/12/21  Resp Panel by RT-PCR (Flu A&B, Covid) Nasopharyngeal Swab     Status: None   Collection Time: 07/12/21  6:51 PM   Specimen: Nasopharyngeal Swab; Nasopharyngeal(NP) swabs in vial transport medium  Result Value Ref Range Status   SARS Coronavirus 2 by RT PCR NEGATIVE NEGATIVE Final    Comment: (NOTE) SARS-CoV-2 target nucleic acids are NOT DETECTED.  The SARS-CoV-2 RNA is generally detectable in upper respiratory  specimens during the acute phase of infection. The lowest concentration of SARS-CoV-2 viral copies this assay can detect is 138 copies/mL. A negative result does not preclude SARS-Cov-2 infection and should not be used as the sole basis for treatment or other patient management decisions. A negative result may occur with  improper specimen collection/handling, submission of specimen other than nasopharyngeal swab, presence of viral mutation(s) within the areas targeted by this assay, and inadequate number of viral copies(<138 copies/mL). A negative result must be combined with clinical observations, patient history, and epidemiological information. The expected result is Negative.  Fact Sheet for Patients:  EntrepreneurPulse.com.au  Fact Sheet for Healthcare Providers:   IncredibleEmployment.be  This test is no t yet approved or cleared by the [place] FDA and  has been authorized for detection and/or diagnosis of SARS-CoV-2 by FDA under an Emergency Use Authorization (EUA). This EUA will remain  in effect (meaning this test can be used) for the duration of the COVID-19 declaration under Section 564(b)(1) of the Act, 21 U.S.C.section 360bbb-3(b)(1), unless the authorization is terminated  or revoked sooner.       Influenza A by PCR NEGATIVE NEGATIVE Final   Influenza B by PCR NEGATIVE NEGATIVE Final    Comment: (NOTE) The Xpert Xpress SARS-CoV-2/FLU/RSV plus assay is intended as an aid in the diagnosis of influenza from Nasopharyngeal swab specimens and should not be used as a sole basis for treatment. Nasal washings and aspirates are unacceptable for Xpert Xpress SARS-CoV-2/FLU/RSV testing.  Fact Sheet for Patients: EntrepreneurPulse.com.au  Fact Sheet for Healthcare Providers: IncredibleEmployment.be  This test is not yet approved or cleared by the [place] FDA and has been authorized for detection and/or diagnosis of SARS-CoV-2 by FDA under an Emergency Use Authorization (EUA). This EUA will remain in effect (meaning this test can be used) for the duration of the COVID-19 declaration under Section 564(b)(1) of the Act, 21 U.S.C. section 360bbb-3(b)(1), unless the authorization is terminated or revoked.  Performed at [facility], [address], [place], [place] [address]   Surgical pcr screen     Status: None   Collection Time: 07/15/21  8:06 AM   Specimen: Nasal Mucosa; Nasal Swab  Result Value Ref Range Status   MRSA, PCR NEGATIVE NEGATIVE Final   Staphylococcus aureus NEGATIVE NEGATIVE Final    Comment: (NOTE) The Xpert SA Assay (FDA approved for NASAL specimens in patients 29 years of age and older), is one component of a comprehensive surveillance  program. It is not intended to diagnose infection nor to guide or monitor treatment. Performed at [facility], [address] [place],  [address]     Coagulation Studies: Recent Labs    07/12/21 1851  LABPROT 16.9*  INR 1.4*    Urinalysis: No results for input(s): COLORURINE, LABSPEC, PHURINE, GLUCOSEU, HGBUR, BILIRUBINUR, KETONESUR, PROTEINUR, UROBILINOGEN, NITRITE, LEUKOCYTESUR in the last 72 hours.  Invalid input(s): APPERANCEUR    Imaging: No results found.   Medications:      calcium acetate  1,334 mg Oral TID WC   Chlorhexidine Gluconate Cloth  6 each Topical Q0600   Chlorhexidine Gluconate Cloth  6 each Topical Daily   cinacalcet  30 mg Oral Q breakfast   docusate sodium  100 mg Oral BID   ferrous sulfate  325 mg Oral BID WC   guaiFENesin  600 mg Oral BID   heparin  5,000 Units Subcutaneous Q8H   metoprolol tartrate  50 mg Oral BID   nicotine  21  mg Transdermal Daily   pantoprazole  20 mg Oral BID AC     Assessment/ Plan:  Mr. RYANE CANAVAN is a 55 y.o. black male with end stage renal disease on hemodialysis, hypertension, COPD, EtOH abuse, atrial fibrillation, congestive heart failure who is admitted to [facility] on Acute respiratory failure with hypoxia ([place]) [J96.01] ESRD on hemodialysis ([place]) [N18.6, Z99.2] AV fistula occlusion, initial encounter ([place]) [T82.898A] Acute on chronic respiratory failure with hypoxemia ([place]) [J96.21]   [facility] [place]. Left AVF 67kg  End Stage Renal Disease on dialysis with fluid volume overload  - Scheduled for dialysis today, unable to treat due to appearance of LUE AVF. Contacted vascular with access concerns of increased swelling of aneurysm and pain. Will dialyze patient tomorrow after evaluation.   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  -  Calcium at goal, phosphorus elevated - Continue calcium acetate with meals - continue cinacalcet daily  Anemia with chronic kidney disease: getting outpatient Mircera as ESA.  - Awaiting updated labs    LOS: 3   10/24/20221:04 PM

## 2021-07-15 NOTE — Progress Notes (Addendum)
Spoke with Radiology CT regarding pt's CT angio PE of the chest procedure. Per Radiology CT, pt's external jugular IV site cannot be used for this procedure. This nurse attempted to obtain a peripheral IV. Pt strongly verbalized "leave me alone. I am tired." This nurse explained reasoning to the need for an IV site. This nurse asked pt to lay flat on his back in order to observe sites before obtaining an IV insertion. PT not cooperative at this time. Pt flapping his right arm back and forth as pt rest on his left side and refusing IV insertion. This nurse then  placed IV consult on MAR order.

## 2021-07-15 NOTE — Progress Notes (Signed)
Patient refused head to toe assessment and medications. Report given to [place], RN

## 2021-07-15 NOTE — Progress Notes (Signed)
Patient scheduled for procedure, will not receive dialysis treatment today.

## 2021-07-15 NOTE — Progress Notes (Signed)
[place] at [place] NAME: Jordan [name]    MR#:  [id number]  DATE OF BIRTH:  02/17/1966  SUBJECTIVE:  patient has been noncompliant with his dialysis due to transportation issues according to patient's family Belinda. Patient got HD yesterday with ultrafiltration of 3 L. Left arm fistula is swollen and tender. Vascular surgery to see. Patient is frustrated wants to eat food he is NPO for fistulogram REVIEW OF SYSTEMS:   Review of Systems  Constitutional:  Negative for chills, fever and weight loss.  HENT:  Negative for ear discharge, ear pain and nosebleeds.   Eyes:  Negative for blurred vision, pain and discharge.  Respiratory:  Negative for sputum production, shortness of breath, wheezing and stridor.   Cardiovascular:  Negative for chest pain, palpitations, orthopnea and PND.  Gastrointestinal:  Negative for abdominal pain, diarrhea, nausea and vomiting.  Genitourinary:  Negative for frequency and urgency.  Musculoskeletal:  Negative for back pain and joint pain.  Neurological:  Negative for sensory change, speech change, focal weakness and weakness.  Psychiatric/Behavioral:  Negative for depression and hallucinations. The patient is not nervous/anxious.   Tolerating Diet:npo today Tolerating PT: ambulatory  DRUG ALLERGIES:   Allergies  Allergen Reactions   Lisinopril Swelling    VITALS:  Blood pressure 94/66, pulse 94, temperature 98.2 F (36.8 C), resp. rate 20, height 5\' 4"  (1.626 m), weight 70.6 kg, SpO2 100 %.  PHYSICAL EXAMINATION:   Physical Exam  GENERAL:  55 y.o.-year-old patient lying in the bed with  acute distress. Chronically ill very restless HEENT: Head atraumatic, normocephalic. Oropharynx and nasopharynx clear. Puffy face and eyelids LUNGS: decreased breath sounds bilaterally, no wheezing, rales, rhonchi. No use of accessory muscles of respiration.  CARDIOVASCULAR: S1, S2 normal. No murmurs, rubs, or gallops.   ABDOMEN: Soft, nontender, nondistended. Bowel sounds present. No organomegaly or mass.  EXTREMITIES: 1+edema b/l.   Upper extremity AV fistula swollen and tender NEUROLOGIC: moves all extremities well.  PSYCHIATRIC:  patient is awake and alert  SKIN:  Pressure Injury 07/14/21 Buttocks Mid Stage 2 -  Partial thickness loss of dermis presenting as a shallow open injury with a red, pink wound bed without slough. open area (Active)  07/14/21 1402  Location: Buttocks  Location Orientation: Mid  Staging: Stage 2 -  Partial thickness loss of dermis presenting as a shallow open injury with a red, pink wound bed without slough.  Wound Description (Comments): open area  Present on Admission: Yes        LABORATORY PANEL:  CBC Recent Labs  Lab 07/13/21 1317  WBC 12.4*  HGB 6.8*  HCT 22.6*  PLT 134*     Chemistries  Recent Labs  Lab 07/12/21 1851 07/13/21 0155 07/13/21 0311 07/13/21 1317 07/15/21 1250  NA 142  --  143   < > 139  K 4.9  --  4.9   < > 4.3  CL 102  --  103   < > 100  CO2 20*  --  21*   < > 18*  GLUCOSE 149*  --  126*   < > 61*  BUN 110*  --  108*   < > 85*  CREATININE 14.11*   < > 14.43*   < > 10.01*  CALCIUM 9.1  --  9.1   < > 8.6*  MG 2.6*  --   --   --   --   AST 16  --  14*  --   --  ALT 16  --  14  --   --   ALKPHOS 108  --  106  --   --   BILITOT 1.5*  --  1.1  --   --    < > = values in this interval not displayed.    Cardiac Enzymes No results for input(s): TROPONINI in the last 168 hours. RADIOLOGY:  No results found. ASSESSMENT AND PLAN:  [name] is a 55 y.o. male with medical history significant of end-stage renal disease on hemodialysis Mondays Wednesdays Fridays, alcohol abuse, tobacco abuse, medication noncompliance, COPD, essential hypertension who apparently has been missing hemodialysis quite a lot leading to his fluid overload and being admitted to the hospital.  He did a similar thing earlier this month on October 9.  Patient  is here again today after missing hemodialysis for a week.  Extremely agitated hypoxic and tachypneic.    Acute on chronic respiratory failure with hypoxemia suspected due to pulmonary edema from missing dialysis for one week acute metabolic encephalopathy/altered mental status in the setting of hypoxia and uremia due to missing dialysis -- patient now started on prosthetics drip -- very agitated. Received Ativan earlier -- to get dialysis today -- if patient continues to remain agitated and hypoxic will consider mechanical ventilator and intubation to protect airway and get urgent hemodialysis --10/23--HD with UF 3 liters yday --K 5.6--give veltassa.--4.3  Anemia of chronic disease in the setting of end-stage renal disease -- no active bleeding. Hemoglobin 6.8 --transfuse adt HD  end-stage renal disease on hemodialysis with noncompliance left AV graft thrombosed per [place] -- patient got temporary HD catheter by vascular surgery to initiate dialysis --vascular sx to perform Left UE fistulogram  sinus tachycardia suspected due to agitation -- continue to monitor  history of tobacco abuse -- nicotine patch  skin lesions suspicious for scabies -- treated with permethrin 10/21  Procedures: HD temporary dialysis catheter Family communication : Belinda family. Trted to leave message for brother voicemail not set up Consults : nephrology, vascular surgery CODE STATUS:  DVT Prophylaxis : heparin Level of care: Med-Surg Status is: Inpatient  Remains inpatient appropriate because: needs to fix the dialysis access prior to discharge      TOTAL TIME TAKING CARE OF THIS PATIENT: 25 minutes.  >50% time spent on counselling and coordination of care  Note: This dictation was prepared with Dragon dictation along with smaller phrase technology. Any transcriptional errors that result from this process are unintentional.  Fritzi Mandes M.D    [facility]   CC: Primary care  physician; Pcp, No Patient ID: Jordan [name], male   DOB: 03-22-1966, 55 y.o.   MRN: [id number]

## 2021-07-15 NOTE — Op Note (Signed)
[facility]    OPERATIVE NOTE   PROCEDURE: 1.   Left arteriovenous fistula cannulation under ultrasound guidance 2.   Left arm fistulagram    PRE-OPERATIVE DIAGNOSIS: 1. ESRD 2. Poorly functional markedly aneurysmal AVF  POST-OPERATIVE DIAGNOSIS: same as above with fistula thrombosis that was clearly not salvageable  SURGEON: Leotis Pain, MD  ANESTHESIA: local with MCS  ESTIMATED BLOOD LOSS: Minimal  FINDING(S): Markedly aneurysmal thrombosed left brachiocephalic AV fistula which is nonsalvageable  SPECIMEN(S):  None  CONTRAST: 5 cc  FLUORO TIME: Less than 1 minutes  MODERATE CONSCIOUS SEDATION TIME: Approximately 5 minutes with 2 mg of Versed and 50 mcg of Fentanyl   INDICATIONS: Jordan [name] is a 55 y.o. male who presents with malfunctioning left arteriovenous fistula.  The patient is scheduled for left arm fistulagram.  The patient is aware the risks include but are not limited to: bleeding, infection, thrombosis of the cannulated access, and possible anaphylactic reaction to the contrast.  The patient is aware of the risks of the procedure and elects to proceed forward.  DESCRIPTION: After full informed written consent was obtained, the patient was brought back to the angiography suite and placed supine upon the angiography table.  The patient was connected to monitoring equipment. Moderate conscious sedation was administered with a face to face encounter with the patient throughout the procedure with my supervision of the RN administering medicines and monitoring the patient's vital signs and mental status throughout from the start of the procedure until the patient was taken to the recovery room. The left was prepped and draped in the standard fashion for a percutaneous access intervention.  Under ultrasound guidance, the left brachiocephalic arteriovenous fistula was cannulated with a micropuncture needle under direct ultrasound guidance where it  was patent and a permanent image was performed.  The microwire was advanced into the fistula and the needle was exchanged for the a microsheath.  I then upsized to a 6 Fr Sheath and imaging was performed.  Hand injections were completed to image the access including the central venous system. This demonstrated a markedly aneurysmal fistula which is thrombosed and clearly not salvageable.    Based on the completion imaging, no further intervention is necessary.  The sheath was removed and pressure was held.  A sterile bandage was applied to the puncture site.  COMPLICATIONS: None  CONDITION: Stable   Leotis Pain  07/15/2021 4:23 PM   This note was created with Dragon Medical transcription system. Any errors in dictation are purely unintentional.

## 2021-07-15 NOTE — Progress Notes (Signed)
Walked into patients room to give Nicotine patch. I asked the patient if it was ok that I was removing his old patch and giving him new one. He shook his head yes. I then proceeded to ask if he had anymore bowel movements over night and when was the last time he urinated. He stated "give me the fucking patch and get out my room". I placed his patch and let him know if he needed anything my number was on the board or he could use his call bell. He then stated "leave now".

## 2021-07-15 NOTE — Progress Notes (Signed)
Patient is requesting pain medicine for BLE pain, but is currently NPO. Dr. Sidney Ace notified. States that patient can have PO meds with sips.

## 2021-07-15 NOTE — Interval H&P Note (Signed)
History and Physical Interval Note:  07/15/2021 3:53 PM  Jordan [name]  has presented today for surgery, with the diagnosis of ESRD.  The various methods of treatment have been discussed with the patient and family. After consideration of risks, benefits and other options for treatment, the patient has consented to  Procedure(s): A/V FISTULAGRAM (Left) as a surgical intervention.  The patient's history has been reviewed, patient examined, no change in status, stable for surgery.  I have reviewed the patient's chart and labs.  Questions were answered to the patient's satisfaction.     Leotis Pain

## 2021-07-16 ENCOUNTER — Inpatient Hospital Stay
Admission: EM | Admit: 2021-07-16 | Discharge: 2021-07-22 | DRG: 252 | Discharge status: Against Medical Advice | Payer: Medicare Other | Attending: Student | Admitting: Student

## 2021-07-16 ENCOUNTER — Encounter: Payer: Self-pay | Admitting: Vascular Surgery

## 2021-07-16 ENCOUNTER — Encounter
Admission: EM | Discharge status: Against Medical Advice | Payer: Self-pay | Source: Home / Self Care | Attending: Student

## 2021-07-16 DIAGNOSIS — A4153 Sepsis due to Serratia: Secondary | ICD-10-CM | POA: Diagnosis present

## 2021-07-16 DIAGNOSIS — F17210 Nicotine dependence, cigarettes, uncomplicated: Secondary | ICD-10-CM | POA: Diagnosis present

## 2021-07-16 DIAGNOSIS — T82868A Thrombosis of vascular prosthetic devices, implants and grafts, initial encounter: Secondary | ICD-10-CM | POA: Diagnosis present

## 2021-07-16 DIAGNOSIS — J9621 Acute and chronic respiratory failure with hypoxia: Secondary | ICD-10-CM

## 2021-07-16 DIAGNOSIS — L89312 Pressure ulcer of right buttock, stage 2: Secondary | ICD-10-CM | POA: Diagnosis present

## 2021-07-16 DIAGNOSIS — D631 Anemia in chronic kidney disease: Secondary | ICD-10-CM | POA: Diagnosis present

## 2021-07-16 DIAGNOSIS — Z833 Family history of diabetes mellitus: Secondary | ICD-10-CM

## 2021-07-16 DIAGNOSIS — T82868D Thrombosis of vascular prosthetic devices, implants and grafts, subsequent encounter: Secondary | ICD-10-CM

## 2021-07-16 DIAGNOSIS — D62 Acute posthemorrhagic anemia: Secondary | ICD-10-CM | POA: Diagnosis present

## 2021-07-16 DIAGNOSIS — Y832 Surgical operation with anastomosis, bypass or graft as the cause of abnormal reaction of the patient, or of later complication, without mention of misadventure at the time of the procedure: Secondary | ICD-10-CM | POA: Diagnosis present

## 2021-07-16 DIAGNOSIS — Z992 Dependence on renal dialysis: Secondary | ICD-10-CM

## 2021-07-16 DIAGNOSIS — I5032 Chronic diastolic (congestive) heart failure: Secondary | ICD-10-CM | POA: Diagnosis present

## 2021-07-16 DIAGNOSIS — R6521 Severe sepsis with septic shock: Secondary | ICD-10-CM | POA: Diagnosis not present

## 2021-07-16 DIAGNOSIS — D649 Anemia, unspecified: Secondary | ICD-10-CM

## 2021-07-16 DIAGNOSIS — I132 Hypertensive heart and chronic kidney disease with heart failure and with stage 5 chronic kidney disease, or end stage renal disease: Secondary | ICD-10-CM | POA: Diagnosis present

## 2021-07-16 DIAGNOSIS — I5043 Acute on chronic combined systolic (congestive) and diastolic (congestive) heart failure: Secondary | ICD-10-CM

## 2021-07-16 DIAGNOSIS — R7881 Bacteremia: Secondary | ICD-10-CM | POA: Diagnosis not present

## 2021-07-16 DIAGNOSIS — Z9115 Patient's noncompliance with renal dialysis: Secondary | ICD-10-CM

## 2021-07-16 DIAGNOSIS — N186 End stage renal disease: Secondary | ICD-10-CM | POA: Diagnosis present

## 2021-07-16 DIAGNOSIS — E440 Moderate protein-calorie malnutrition: Secondary | ICD-10-CM | POA: Diagnosis present

## 2021-07-16 DIAGNOSIS — Z7189 Other specified counseling: Secondary | ICD-10-CM | POA: Diagnosis not present

## 2021-07-16 DIAGNOSIS — T827XXD Infection and inflammatory reaction due to other cardiac and vascular devices, implants and grafts, subsequent encounter: Secondary | ICD-10-CM

## 2021-07-16 DIAGNOSIS — Z452 Encounter for adjustment and management of vascular access device: Secondary | ICD-10-CM

## 2021-07-16 DIAGNOSIS — Z841 Family history of disorders of kidney and ureter: Secondary | ICD-10-CM

## 2021-07-16 DIAGNOSIS — Z79899 Other long term (current) drug therapy: Secondary | ICD-10-CM

## 2021-07-16 DIAGNOSIS — G9341 Metabolic encephalopathy: Secondary | ICD-10-CM | POA: Diagnosis present

## 2021-07-16 DIAGNOSIS — L089 Local infection of the skin and subcutaneous tissue, unspecified: Secondary | ICD-10-CM

## 2021-07-16 DIAGNOSIS — L299 Pruritus, unspecified: Secondary | ICD-10-CM | POA: Diagnosis present

## 2021-07-16 DIAGNOSIS — J449 Chronic obstructive pulmonary disease, unspecified: Secondary | ICD-10-CM | POA: Diagnosis present

## 2021-07-16 DIAGNOSIS — E872 Acidosis, unspecified: Secondary | ICD-10-CM | POA: Diagnosis present

## 2021-07-16 DIAGNOSIS — L03114 Cellulitis of left upper limb: Secondary | ICD-10-CM | POA: Diagnosis present

## 2021-07-16 DIAGNOSIS — T827XXA Infection and inflammatory reaction due to other cardiac and vascular devices, implants and grafts, initial encounter: Secondary | ICD-10-CM | POA: Diagnosis not present

## 2021-07-16 DIAGNOSIS — B86 Scabies: Secondary | ICD-10-CM | POA: Diagnosis present

## 2021-07-16 DIAGNOSIS — T82838A Hemorrhage of vascular prosthetic devices, implants and grafts, initial encounter: Secondary | ICD-10-CM | POA: Diagnosis present

## 2021-07-16 DIAGNOSIS — Z20822 Contact with and (suspected) exposure to covid-19: Secondary | ICD-10-CM | POA: Diagnosis present

## 2021-07-16 DIAGNOSIS — Z6827 Body mass index (BMI) 27.0-27.9, adult: Secondary | ICD-10-CM

## 2021-07-16 DIAGNOSIS — N2581 Secondary hyperparathyroidism of renal origin: Secondary | ICD-10-CM | POA: Diagnosis present

## 2021-07-16 DIAGNOSIS — R001 Bradycardia, unspecified: Secondary | ICD-10-CM | POA: Diagnosis not present

## 2021-07-16 DIAGNOSIS — J95821 Acute postprocedural respiratory failure: Secondary | ICD-10-CM | POA: Diagnosis present

## 2021-07-16 DIAGNOSIS — Z66 Do not resuscitate: Secondary | ICD-10-CM | POA: Diagnosis present

## 2021-07-16 DIAGNOSIS — Z5329 Procedure and treatment not carried out because of patient's decision for other reasons: Secondary | ICD-10-CM | POA: Diagnosis present

## 2021-07-16 DIAGNOSIS — J9690 Respiratory failure, unspecified, unspecified whether with hypoxia or hypercapnia: Secondary | ICD-10-CM

## 2021-07-16 DIAGNOSIS — L89322 Pressure ulcer of left buttock, stage 2: Secondary | ICD-10-CM | POA: Diagnosis present

## 2021-07-16 DIAGNOSIS — E876 Hypokalemia: Secondary | ICD-10-CM | POA: Diagnosis not present

## 2021-07-16 LAB — BLOOD CULTURE ID PANEL (REFLEXED) - BCID2

## 2021-07-16 LAB — HEPATITIS B SURFACE ANTIBODY, QUANTITATIVE: Hep B S AB Quant (Post): 821.7 m[IU]/mL (ref >9.9)

## 2021-07-16 SURGERY — DIALYSIS/PERMA CATHETER INSERTION
Anesthesia: Moderate Sedation

## 2021-07-16 MED ORDER — PERMETHRIN 5 % EX CREA
TOPICAL_CREAM | Freq: Once | CUTANEOUS | Status: DC
  Filled 2021-07-16: qty 60

## 2021-07-16 MED ORDER — SODIUM CHLORIDE 0.9 % IV SOLN
2.0000 g | INTRAVENOUS | Status: AC
  Administered 2021-07-16: 2 g via INTRAVENOUS
  Filled 2021-07-16: qty 2

## 2021-07-16 NOTE — Progress Notes (Addendum)
Patient transferred to 114 from 1A in stable condition. Oriented to room and call bell. PM medications given. Patient requested bath, set up for bath at sink, did refuse staff to stay in room with him. Observed ambulating to bathroom with steady gait for bowel movement, did not wear O2 while ambulating but replaced once back at the sink. Educated regarding fall precautions, declined bed alarm and socks at this time. Stated he would call when done to be placed back on telemetry.    0500 - refused morning vitals, scheduled heparin, and did not allow staff to obtain weight.

## 2021-07-16 NOTE — Consult Note (Signed)
NAME: Jordan [name]  DOB: 1966-04-17  MRN: [id number]  Date/Time: 07/16/2021 12:58 PM  REQUESTING PROVIDER: Dr.Patel Subjective:  REASON FOR CONSULT: serratia bacteremia ? Jordan [name] is a 55 y.o. with a history of end-stage renal disease, on dialysis, anemia, emphysema hypertension, CHF, A. fib presented with shortness of breath on 07/12/2021 after missing 3 dialysis sessions.  In the ED he was found to be extremely agitated, hypoxic and tachypneic.  Temperature 98.2, BP 211/187, heart rate 175, respiratory rate 27 and oxygen sats of 91%.  He was placed on BiPAP.  White count was 13.9, hemoglobin 8.2 and platelets 141.  BUN was 110 and creatinine 1.14.  Sodium 132.  Troponin 141 and bilirubin was 1.5.  COVID-19 viral screens were negative.  Chest x-ray showed evidence of fluid overload. No blood culture was sent on admission.  An ultrasound of the left upper extremity showed thrombosed AV fistula in the left arm with near occlusive clot. He was noted by nephrologist on 07/13/2021 to have tenderness over the AV fistula and as it was nonfunctioning temporary dialysis catheter was placed on 07/13/2021.  I am asked to see patient for serratia bacteremia Pt is wanting to leave AMA- says he has a 54 year old and he needs to get her from his grandmother to his cousin who will take care . He says he will come back tomorrow. When asked why he has been missing dialysis , he says he does not get a ride always to go to the center  Was recently in the hospital between 06/26/2021 until 07/05/2021 for shortness of breath due to acute on chronic diastolic CHF, atrial fibrillation with rapid RVR and acute on chronic hypoxic respiratory failure.  T he was noncompliant with dialysis.  During that hospitalization he was noted to have significant neck swelling and oral and tongue swelling anaphylactic reaction was suspected and he was treated with IV Benadryl, IM epinephrine and IV steroids.  He also initially was  intubated and placed on mechanical ventilation and required Levophed for hypotension.He was treated with IV Lasix, IV Cardizem and supplemental oxygen.  Extra dialysis was done to remove the excess fluid load.    Swelling improved after hemodialysis.  Orofacial swelling was later attributed to fluid overload rather than anaphylactic reaction.  He also was found to have ascites and a paracentesis yielded 5 L of ascitic fluid.   Past Medical History:  Diagnosis Date   Anemia    Aortic atherosclerosis ([place]) 11/12/2019   ED (erectile dysfunction)    Emphysema of lung ([place]) 11/12/2019   ESRD on hemodialysis ([place])    M/W/F in [place], [place]   ETOH abuse    History of blood transfusion    Hypertension    Wears dentures     Past Surgical History:  Procedure Laterality Date   A/V FISTULAGRAM Left 07/15/2021   Procedure: A/V FISTULAGRAM;  Surgeon: Algernon Huxley, MD;  Location: [facility];  Service: Cardiovascular;  Laterality: Left;   BASCILIC VEIN TRANSPOSITION Left 08/07/2016   Procedure: LEFT BASILIC VEIN TRANSPOSITION;  Surgeon: Angelia Mould, MD;  Location: [facility];  Service: Vascular;  Laterality: Left;   CLOSED REDUCTION NASAL FRACTURE N/A 08/11/2019   Procedure: CLOSED REDUCTION NASAL FRACTURE WITH STABILIZATION;  Surgeon: Irene Limbo, MD;  Location: [facility];  Service: Plastics;  Laterality: N/A;   COLONOSCOPY N/A 08/12/2016   Procedure: COLONOSCOPY;  Surgeon: Otis Brace, MD;  Location: [facility];  Service: Gastroenterology;  Laterality: N/A;  COLONOSCOPY WITH PROPOFOL N/A 06/05/2021   Procedure: COLONOSCOPY WITH PROPOFOL;  Surgeon: Sharyn Creamer, MD;  Location: [facility];  Service: Gastroenterology;  Laterality: N/A;   ESOPHAGOGASTRODUODENOSCOPY N/A 08/12/2016   Procedure: ESOPHAGOGASTRODUODENOSCOPY (EGD);  Surgeon: Otis Brace, MD;  Location: [facility];  Service: Gastroenterology;  Laterality: N/A;   ESOPHAGOGASTRODUODENOSCOPY (EGD) WITH PROPOFOL  N/A 06/05/2021   Procedure: ESOPHAGOGASTRODUODENOSCOPY (EGD) WITH PROPOFOL;  Surgeon: Sharyn Creamer, MD;  Location: [facility];  Service: Gastroenterology;  Laterality: N/A;   INSERTION OF DIALYSIS CATHETER N/A 08/07/2016   Procedure: INSERTION OF TUNNELED DIALYSIS CATHETER;  Surgeon: Angelia Mould, MD;  Location: [facility];  Service: Vascular;  Laterality: N/A;   LIGATION OF ARTERIOVENOUS  FISTULA Left 09/12/2016   Procedure: BANDING OF LEFT  ARTERIOVENOUS  FISTULA;  Surgeon: Angelia Mould, MD;  Location: [facility];  Service: Vascular;  Laterality: Left;   LOWER EXTREMITY INTERVENTION Right 12/02/2018   Procedure: LOWER EXTREMITY INTERVENTION;  Surgeon: Algernon Huxley, MD;  Location: [facility];  Service: Cardiovascular;  Laterality: Right;   POLYPECTOMY  06/05/2021   Procedure: POLYPECTOMY;  Surgeon: Sharyn Creamer, MD;  Location: [facility];  Service: Gastroenterology;;    Social History   Socioeconomic History   Marital status: Single    Spouse name: Not on file   Number of children: Not on file   Years of education: Not on file   Highest education level: Not on file  Occupational History   Occupation: disabled  Tobacco Use   Smoking status: Every Day    Packs/day: 0.50    Years: 40.00    Pack years: 20.00    Types: Cigarettes   Smokeless tobacco: Never  Vaping Use   Vaping Use: Never used  Substance and Sexual Activity   Alcohol use: Not Currently    Alcohol/week: 21.0 standard drinks    Types: 21 Cans of beer per week    Comment: last drink 6 months ago   Drug use: No   Sexual activity: Not Currently  Other Topics Concern   Not on file  Social History Narrative   Not on file   Social Determinants of Health   Financial Resource Strain: Not on file  Food Insecurity: Not on file  Transportation Needs: Not on file  Physical Activity: Not on file  Stress: Not on file  Social Connections: Not on file  Intimate Partner Violence: Not on file     Family History  Problem Relation Age of Onset   Diabetes Mother    Kidney failure Mother    Healthy Father    Kidney failure Brother    Healthy Sister    Kidney disease Daughter    Post-traumatic stress disorder Neg Hx    Bladder Cancer Neg Hx    Kidney cancer Neg Hx    Allergies  Allergen Reactions   Lisinopril Swelling   I? Current Facility-Administered Medications  Medication Dose Route Frequency Provider Last Rate Last Admin   acetaminophen (TYLENOL) tablet 650 mg  650 mg Oral Q6H PRN Algernon Huxley, MD       Or   acetaminophen (TYLENOL) suppository 650 mg  650 mg Rectal Q6H PRN Algernon Huxley, MD       albuterol (PROVENTIL) (2.5 MG/3ML) 0.083% nebulizer solution 3 mL  3 mL Inhalation Q4H PRN Algernon Huxley, MD   3 mL at 07/15/21 0513   calcium acetate (PHOSLO) capsule 1,334 mg  1,334 mg Oral TID WC Algernon Huxley,  MD   1,334 mg at 07/14/21 1839   calcium carbonate (dosed in mg elemental calcium) suspension 500 mg of elemental calcium  500 mg of elemental calcium Oral Q6H PRN Algernon Huxley, MD       camphor-menthol [facility]) lotion 1 application  1 application Topical B3A PRN Algernon Huxley, MD   1 application at 19/37/90 2258   And   hydrOXYzine (ATARAX/VISTARIL) tablet 25 mg  25 mg Oral Q8H PRN Algernon Huxley, MD   25 mg at 07/15/21 2257   Chlorhexidine Gluconate Cloth 2 % PADS 6 each  6 each Topical Daily Algernon Huxley, MD   6 each at 07/15/21 0915   chlorpheniramine-HYDROcodone (TUSSIONEX) 10-8 MG/5ML suspension 5 mL  5 mL Oral Q12H PRN Algernon Huxley, MD   5 mL at 07/14/21 2409   cinacalcet (SENSIPAR) tablet 30 mg  30 mg Oral Q breakfast Dew, Erskine Squibb, MD       docusate sodium (COLACE) capsule 100 mg  100 mg Oral BID Algernon Huxley, MD   100 mg at 07/15/21 2256   docusate sodium (ENEMEEZ) enema 283 mg  1 enema Rectal PRN Algernon Huxley, MD       feeding supplement (NEPRO CARB STEADY) liquid 237 mL  237 mL Oral TID PRN Algernon Huxley, MD       ferrous sulfate tablet 325 mg  325 mg Oral BID WC Algernon Huxley, MD   325 mg at 07/14/21 1839   guaiFENesin (MUCINEX) 12 hr tablet 600 mg  600 mg Oral BID Algernon Huxley, MD   600 mg at 07/15/21 2256   heparin injection 5,000 Units  5,000 Units Subcutaneous Q8H Algernon Huxley, MD   5,000 Units at 07/15/21 2256   HYDROmorphone (DILAUDID) injection 1 mg  1 mg Intravenous Once PRN Algernon Huxley, MD       iohexol (OMNIPAQUE) 350 MG/ML injection 75 mL  75 mL Intravenous Once PRN Algernon Huxley, MD       metoprolol tartrate (LOPRESSOR) tablet 50 mg  50 mg Oral BID Algernon Huxley, MD   50 mg at 07/15/21 2256   nicotine (NICODERM CQ - dosed in mg/24 hours) patch 21 mg  21 mg Transdermal Daily Algernon Huxley, MD   21 mg at 07/15/21 0908   ondansetron (ZOFRAN) tablet 4 mg  4 mg Oral Q6H PRN Algernon Huxley, MD       Or   ondansetron (ZOFRAN) injection 4 mg  4 mg Intravenous Q6H PRN Algernon Huxley, MD       ondansetron (ZOFRAN) injection 4 mg  4 mg Intravenous Q6H PRN Algernon Huxley, MD       pantoprazole (PROTONIX) EC tablet 20 mg  20 mg Oral BID AC Algernon Huxley, MD   20 mg at 07/14/21 1839   [START ON 07/18/2021] permethrin (ELIMITE) 5 % cream   Topical Once Fritzi Mandes, MD       sorbitol 70 % solution 30 mL  30 mL Oral PRN Algernon Huxley, MD       zolpidem (AMBIEN) tablet 5 mg  5 mg Oral QHS PRN Algernon Huxley, MD         Abtx:  Anti-infectives (From admission, onward)    Start     Dose/Rate Route Frequency Ordered Stop   07/16/21 1100  ceFEPIme (MAXIPIME) 2 g in sodium chloride 0.9 % 100 mL IVPB  2 g 200 mL/hr over 30 Minutes Intravenous To Hemodialysis 07/16/21 0904 07/16/21 1138   07/16/21 0000  ceFAZolin (ANCEF) IVPB 1 g/50 mL premix       Note to Pharmacy: To be given in specials   1 g 100 mL/hr over 30 Minutes Intravenous  Once 07/15/21 1532 07/15/21 2246       REVIEW OF SYSTEMS:  Const: negative fever, negative chills, negative weight loss Eyes: negative diplopia or visual changes, negative eye pain ENT: negative coryza, negative sore throat Resp:   cough, , dyspnea Cards: negative for chest pain, has palpitations, lower extremity edema GU: negative for frequency, dysuria and hematuria GI: Negative for abdominal pain, diarrhea, bleeding, constipation Skin: negative for rash and pruritus Heme: negative for easy bruising and gum/nose bleeding MS: generalized weakness Neurolo:negative for headaches, has dizziness, vertigo, memory problems  Psych: negative for feelings of anxiety, depression  Endocrine: negative for thyroid, diabetes Allergy/Immunology- negative for any medication or food allergies ?  Objective:  VITALS:  BP 112/73   Pulse (!) 183   Temp 98.3 F (36.8 C) (Oral)   Resp (!) 25   Ht 5\' 4"  (1.626 m)   Wt 74.9 kg   SpO2 (!) 31%   BMI 28.34 kg/m  PHYSICAL EXAM:  General: Alert, puffy face Chronically ill edentulous Head: Normocephalic, without obvious abnormality, atraumatic. Eyes: Conjunctivae clear, anicteric sclerae. Pupils are equal ENT Nares normal. No drainage or sinus tenderness. Lips, mucosa, and tongue normal. No Thrush Back: No CVA tenderness. Lungs: b/l air entry- crepts bases Heart: irregular Abdomen: Soft,  distended.  Extremities: rt femoral catheter is being removed by the vascular nurse Left arm AVF covered with dressing- strike thru with old blood  Skin: scratch marks Lymph: Cervical, supraclavicular normal. Neurologic: Grossly non-focal Pertinent Labs Lab Results CBC    Component Value Date/Time   WBC 16.8 (H) 07/15/2021 1928   RBC 2.66 (L) 07/15/2021 1928   HGB 8.4 (L) 07/15/2021 1928   HCT 26.8 (L) 07/15/2021 1928   PLT 133 (L) 07/15/2021 1928   MCV 100.8 (H) 07/15/2021 1928   MCH 31.6 07/15/2021 1928   MCHC 31.3 07/15/2021 1928   RDW 16.3 (H) 07/15/2021 1928   LYMPHSABS 0.5 (L) 07/12/2021 1851   MONOABS 0.9 07/12/2021 1851   EOSABS 0.1 07/12/2021 1851   BASOSABS 0.0 07/12/2021 1851    CMP Latest Ref Rng & Units 07/15/2021 07/15/2021 07/13/2021  Glucose 70 - 99 mg/dL 71  61(L) 105(H)  BUN 6 - 20 mg/dL 88(H) 85(H) 133(H)  Creatinine 0.61 - 1.24 mg/dL 10.25(H) 10.01(H) 14.84(H)  Sodium 135 - 145 mmol/L 140 139 144  Potassium 3.5 - 5.1 mmol/L 4.1 4.3 5.6(H)  Chloride 98 - 111 mmol/L 101 100 101  CO2 22 - 32 mmol/L 20(L) 18(L) 23  Calcium 8.9 - 10.3 mg/dL 8.6(L) 8.6(L) 8.9  Total Protein 6.5 - 8.1 g/dL - - -  Total Bilirubin 0.3 - 1.2 mg/dL - - -  Alkaline Phos 38 - 126 U/L - - -  AST 15 - 41 U/L - - -  ALT 0 - 44 U/L - - -      Microbiology: Recent Results (from the past 240 hour(s))  Resp Panel by RT-PCR (Flu A&B, Covid) Nasopharyngeal Swab     Status: None   Collection Time: 07/12/21  6:51 PM   Specimen: Nasopharyngeal Swab; Nasopharyngeal(NP) swabs in vial transport medium  Result Value Ref Range Status   SARS Coronavirus 2 by RT PCR NEGATIVE NEGATIVE Final  Comment: (NOTE) SARS-CoV-2 target nucleic acids are NOT DETECTED.  The SARS-CoV-2 RNA is generally detectable in upper respiratory specimens during the acute phase of infection. The lowest concentration of SARS-CoV-2 viral copies this assay can detect is 138 copies/mL. A negative result does not preclude SARS-Cov-2 infection and should not be used as the sole basis for treatment or other patient management decisions. A negative result may occur with  improper specimen collection/handling, submission of specimen other than nasopharyngeal swab, presence of viral mutation(s) within the areas targeted by this assay, and inadequate number of viral copies(<138 copies/mL). A negative result must be combined with clinical observations, patient history, and epidemiological information. The expected result is Negative.  Fact Sheet for Patients:  EntrepreneurPulse.com.au  Fact Sheet for Healthcare Providers:  IncredibleEmployment.be  This test is no t yet approved or cleared by the [place] FDA and  has been authorized for detection and/or diagnosis of  SARS-CoV-2 by FDA under an Emergency Use Authorization (EUA). This EUA will remain  in effect (meaning this test can be used) for the duration of the COVID-19 declaration under Section 564(b)(1) of the Act, 21 U.S.C.section 360bbb-3(b)(1), unless the authorization is terminated  or revoked sooner.       Influenza A by PCR NEGATIVE NEGATIVE Final   Influenza B by PCR NEGATIVE NEGATIVE Final    Comment: (NOTE) The Xpert Xpress SARS-CoV-2/FLU/RSV plus assay is intended as an aid in the diagnosis of influenza from Nasopharyngeal swab specimens and should not be used as a sole basis for treatment. Nasal washings and aspirates are unacceptable for Xpert Xpress SARS-CoV-2/FLU/RSV testing.  Fact Sheet for Patients: EntrepreneurPulse.com.au  Fact Sheet for Healthcare Providers: IncredibleEmployment.be  This test is not yet approved or cleared by the [place] FDA and has been authorized for detection and/or diagnosis of SARS-CoV-2 by FDA under an Emergency Use Authorization (EUA). This EUA will remain in effect (meaning this test can be used) for the duration of the COVID-19 declaration under Section 564(b)(1) of the Act, 21 U.S.C. section 360bbb-3(b)(1), unless the authorization is terminated or revoked.  Performed at [facility], [address] [place], [place] [address]   Surgical pcr screen     Status: None   Collection Time: 07/15/21  8:06 AM   Specimen: Nasal Mucosa; Nasal Swab  Result Value Ref Range Status   MRSA, PCR NEGATIVE NEGATIVE Final   Staphylococcus aureus NEGATIVE NEGATIVE Final    Comment: (NOTE) The Xpert SA Assay (FDA approved for NASAL specimens in patients 5 years of age and older), is one component of a comprehensive surveillance program. It is not intended to diagnose infection nor to guide or monitor treatment. Performed at [facility], [place], [place] [address]   CULTURE,  BLOOD (ROUTINE X 2) w Reflex to ID Panel     Status: None (Preliminary result)   Collection Time: 07/15/21 12:21 PM   Specimen: BLOOD  Result Value Ref Range Status   Specimen Description BLOOD RIGHT ANTECUBITAL  Final   Special Requests   Final    BOTTLES DRAWN AEROBIC ONLY Blood Culture adequate volume   Culture  Setup Time   Final    AEROBIC BOTTLE ONLY GRAM NEGATIVE RODS CRITICAL VALUE NOTED.  VALUE IS CONSISTENT WITH PREVIOUSLY REPORTED AND CALLED VALUE. Performed at [facility], [address] [place], [place] [address]    Culture GRAM NEGATIVE RODS  Final   Report Status PENDING  Incomplete  CULTURE, BLOOD (ROUTINE X 2)  w Reflex to ID Panel     Status: None (Preliminary result)   Collection Time: 07/15/21 12:50 PM   Specimen: BLOOD  Result Value Ref Range Status   Specimen Description BLOOD BLOOD RIGHT HAND  Final   Special Requests   Final    BOTTLES DRAWN AEROBIC ONLY Blood Culture results may not be optimal due to an inadequate volume of blood received in culture bottles   Culture  Setup Time   Final    GRAM NEGATIVE RODS AEROBIC BOTTLE ONLY Organism ID to follow CRITICAL RESULT CALLED TO, READ BACK BY AND VERIFIED WITH: Eleonore Chiquito 07/16/21 0737 KLW Performed at [facility], [address] [place], [place] [address]    Culture GRAM NEGATIVE RODS  Final   Report Status PENDING  Incomplete  Blood Culture ID Panel (Reflexed)     Status: Abnormal   Collection Time: 07/15/21 12:50 PM  Result Value Ref Range Status   Enterococcus faecalis NOT DETECTED NOT DETECTED Final   Enterococcus Faecium NOT DETECTED NOT DETECTED Final   Listeria monocytogenes NOT DETECTED NOT DETECTED Final   Staphylococcus species NOT DETECTED NOT DETECTED Final   Staphylococcus aureus (BCID) NOT DETECTED NOT DETECTED Final   Staphylococcus epidermidis NOT DETECTED NOT DETECTED Final   Staphylococcus lugdunensis NOT DETECTED NOT DETECTED Final   Streptococcus species NOT  DETECTED NOT DETECTED Final   Streptococcus agalactiae NOT DETECTED NOT DETECTED Final   Streptococcus pneumoniae NOT DETECTED NOT DETECTED Final   Streptococcus pyogenes NOT DETECTED NOT DETECTED Final   A.calcoaceticus-baumannii NOT DETECTED NOT DETECTED Final   Bacteroides fragilis NOT DETECTED NOT DETECTED Final   Enterobacterales DETECTED (A) NOT DETECTED Final    Comment: Enterobacterales represent a large order of gram negative bacteria, not a single organism. CRITICAL RESULT CALLED TO, READ BACK BY AND VERIFIED WITH: Winfield Rast PATEL 07/16/21 0737 KLW    Enterobacter cloacae complex NOT DETECTED NOT DETECTED Final   Escherichia coli NOT DETECTED NOT DETECTED Final   Klebsiella aerogenes NOT DETECTED NOT DETECTED Final   Klebsiella oxytoca NOT DETECTED NOT DETECTED Final   Klebsiella pneumoniae NOT DETECTED NOT DETECTED Final   Proteus species NOT DETECTED NOT DETECTED Final   Salmonella species NOT DETECTED NOT DETECTED Final   Serratia marcescens DETECTED (A) NOT DETECTED Final    Comment: CRITICAL RESULT CALLED TO, READ BACK BY AND VERIFIED WITH: Winfield Rast PATEL 07/16/21 0737 KLW    Haemophilus influenzae NOT DETECTED NOT DETECTED Final   Neisseria meningitidis NOT DETECTED NOT DETECTED Final   Pseudomonas aeruginosa NOT DETECTED NOT DETECTED Final   Stenotrophomonas maltophilia NOT DETECTED NOT DETECTED Final   Candida albicans NOT DETECTED NOT DETECTED Final   Candida auris NOT DETECTED NOT DETECTED Final   Candida glabrata NOT DETECTED NOT DETECTED Final   Candida krusei NOT DETECTED NOT DETECTED Final   Candida parapsilosis NOT DETECTED NOT DETECTED Final   Candida tropicalis NOT DETECTED NOT DETECTED Final   Cryptococcus neoformans/gattii NOT DETECTED NOT DETECTED Final   CTX-M ESBL NOT DETECTED NOT DETECTED Final   Carbapenem resistance IMP NOT DETECTED NOT DETECTED Final   Carbapenem resistance KPC NOT DETECTED NOT DETECTED Final   Carbapenem resistance NDM NOT DETECTED  NOT DETECTED Final   Carbapenem resist OXA 48 LIKE NOT DETECTED NOT DETECTED Final   Carbapenem resistance VIM NOT DETECTED NOT DETECTED Final    Comment: Performed at [facility], [address] [place], [place] [address]    IMAGING RESULTS: I have personally reviewed  the films ? Impression/Recommendation ? 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 Pt is on cefepime Repeat blood culture until clearance of bacteremia Will need 2 d echo to look at the valves   ESRD on dialysis- left AV fistula infected  Diastolic CHF with fluid overload due to missing dialysis sessions  Anemia ___________________________________________________ Discussed with patient, requesting provider Note:  This document was prepared using Dragon voice recognition software and may include unintentional dictation errors.

## 2021-07-16 NOTE — Consult Note (Addendum)
Consultation Note Date: 07/16/2021   Patient Name: Jordan [name]  DOB: October 21, 1965  MRN: [id number]  Age / Sex: 55 y.o., male  PCP: Pcp, No Referring Physician: No att. providers found  Reason for Consultation: Establishing goals of care  HPI/Patient Profile: DELLA HOMAN is a 55 y.o. male with a history of ESRD on hemodialysis Monday Wednesday Friday, alcohol abuse, hypertension, COPD who comes ED complaining of shortness of breath for the past 2 days, constant, gradually worsening, severe.  EMS report that his oxygen saturation was 74% on room air, improved to 90% with 15 L nonrebreather oxygen.  Patient denies pain.  Clinical Assessment and Goals of Care: Patient is resting in bed in dialysis. He states he has a son named Ethelene Hal [phone]. He tells me his brother has died recently and this has been hard on him.  He states he has missed dialysis because of difficulty with his transport agency.   He states he understands he must do dialysis to live, and states he would like to continue dialysis. He voices awareness that he would be a hospice facility candidate if he chose to stop dialysis.  He begins asking about calling a ride to go home. Nephrology is aware.     SUMMARY OF RECOMMENDATIONS   Patient wants to continue dialysis. Could be followed by palliative outpatient if he is amenable.        Primary Diagnoses: Present on Admission:  Acute on chronic combined systolic and diastolic CHF (congestive heart failure) (HCC)  Alcohol abuse  Anemia in ESRD (end-stage renal disease) (HCC)  COPD (chronic obstructive pulmonary disease) (HCC)  Essential hypertension  Moderate protein-calorie malnutrition (HCC)  PAF (paroxysmal atrial fibrillation) (HCC)  Tobacco abuse  Acute on chronic respiratory failure with hypoxemia ([place])   I have reviewed the medical record, interviewed the patient and  family, and examined the patient. The following aspects are pertinent.  Past Medical History:  Diagnosis Date   Anemia    Aortic atherosclerosis ([place]) 11/12/2019   ED (erectile dysfunction)    Emphysema of lung ([place]) 11/12/2019   ESRD on hemodialysis ([place])    M/W/F in [place], [place]   ETOH abuse    History of blood transfusion    Hypertension    Wears dentures    Social History   Socioeconomic History   Marital status: Single    Spouse name: Not on file   Number of children: Not on file   Years of education: Not on file   Highest education level: Not on file  Occupational History   Occupation: disabled  Tobacco Use   Smoking status: Every Day    Packs/day: 0.50    Years: 40.00    Pack years: 20.00    Types: Cigarettes   Smokeless tobacco: Never  Vaping Use   Vaping Use: Never used  Substance and Sexual Activity   Alcohol use: Not Currently    Alcohol/week: 21.0 standard drinks    Types: 21 Cans of beer per week  Comment: last drink 6 months ago   Drug use: No   Sexual activity: Not Currently  Other Topics Concern   Not on file  Social History Narrative   Not on file   Social Determinants of Health   Financial Resource Strain: Not on file  Food Insecurity: Not on file  Transportation Needs: Not on file  Physical Activity: Not on file  Stress: Not on file  Social Connections: Not on file   Family History  Problem Relation Age of Onset   Diabetes Mother    Kidney failure Mother    Healthy Father    Kidney failure Brother    Healthy Sister    Kidney disease Daughter    Post-traumatic stress disorder Neg Hx    Bladder Cancer Neg Hx    Kidney cancer Neg Hx    Scheduled Meds:  calcium acetate  1,334 mg Oral TID WC   Chlorhexidine Gluconate Cloth  6 each Topical Daily   cinacalcet  30 mg Oral Q breakfast   docusate sodium  100 mg Oral BID   ferrous sulfate  325 mg Oral BID WC   guaiFENesin  600 mg Oral BID   heparin  5,000 Units Subcutaneous Q8H    metoprolol tartrate  50 mg Oral BID   nicotine  21 mg Transdermal Daily   pantoprazole  20 mg Oral BID AC   [START ON 07/18/2021] permethrin   Topical Once   Continuous Infusions: PRN Meds:.acetaminophen **OR** acetaminophen, albuterol, calcium carbonate (dosed in mg elemental calcium), camphor-menthol **AND** hydrOXYzine, chlorpheniramine-HYDROcodone, docusate sodium, feeding supplement (NEPRO CARB STEADY), HYDROmorphone (DILAUDID) injection, iohexol, ondansetron **OR** ondansetron (ZOFRAN) IV, ondansetron (ZOFRAN) IV, sorbitol, zolpidem Medications Prior to Admission:  Prior to Admission medications   Medication Sig Start Date End Date Taking? Authorizing Provider  albuterol (VENTOLIN HFA) 108 (90 Base) MCG/ACT inhaler Inhale 2 puffs into the lungs every 4 (four) hours as needed for wheezing or shortness of breath. 06/05/21  Yes Danford, Suann Larry, MD  calcium acetate (PHOSLO) 667 MG capsule Take 2 capsules (1,334 mg total) by mouth 3 (three) times daily with meals. 06/05/21  Yes Danford, Suann Larry, MD  cinacalcet (SENSIPAR) 30 MG tablet Take 1 tablet (30 mg total) by mouth daily with breakfast. 06/05/21  Yes Danford, Suann Larry, MD  docusate sodium (COLACE) 100 MG capsule Take 1 capsule (100 mg total) by mouth 2 (two) times daily. 06/05/21  Yes Danford, Suann Larry, MD  epoetin alfa (EPOGEN) 4000 UNIT/ML injection Inject 1 mL (4,000 Units total) into the vein every Monday, Wednesday, and Friday with hemodialysis. 06/22/20  Yes Enzo Bi, MD  ferrous sulfate 325 (65 FE) MG tablet Take 1 tablet (325 mg total) by mouth 2 (two) times daily with a meal. 06/05/21  Yes Danford, Suann Larry, MD  metoprolol tartrate (LOPRESSOR) 50 MG tablet Take 1 tablet (50 mg total) by mouth 2 (two) times daily. 07/04/21  Yes Jennye Boroughs, MD  pantoprazole (PROTONIX) 20 MG tablet Take 1 tablet (20 mg total) by mouth 2 (two) times daily before a meal. 06/05/21 08/04/21 Yes Danford, Suann Larry, MD    Allergies  Allergen Reactions   Lisinopril Swelling   Review of Systems  All other systems reviewed and are negative.  Physical Exam Pulmonary:     Effort: Pulmonary effort is normal.  Neurological:     Mental Status: He is alert.    Vital Signs: BP 134/87 (BP Location: Right Arm)   Pulse 92   Temp 98.9 F (  37.2 C) (Oral)   Resp 16   Ht 5\' 4"  (1.626 m)   Wt 74.9 kg   SpO2 98%   BMI 28.34 kg/m  Pain Scale: 0-10 POSS *See Group Information*: 1-Acceptable,Awake and alert Pain Score: 0-No pain   SpO2: SpO2: 98 % O2 Device:SpO2: 98 % O2 Flow Rate: .O2 Flow Rate (L/min): 4 L/min  IO: Intake/output summary:  Intake/Output Summary (Last 24 hours) at 07/16/2021 1559 Last data filed at 07/16/2021 1150 Gross per 24 hour  Intake 686 ml  Output 3001 ml  Net -2315 ml    LBM: Last BM Date: 07/14/21 Baseline Weight: Weight: 88 kg Most recent weight: Weight: 74.9 kg        Time In: 11:00 Time Out: 11:30 Time Total: 30 min Greater than 50%  of this time was spent counseling and coordinating care related to the above assessment and plan.  Signed by: Asencion Gowda, NP   Please contact Palliative Medicine Team phone at [phone] for questions and concerns.  For individual provider: See Shea Evans

## 2021-07-16 NOTE — Progress Notes (Signed)
TX initiated without incident, patient uf goal is 3KG. Will monitor the patient closely.

## 2021-07-16 NOTE — Progress Notes (Signed)
Tx completed without incident, was able to remove 3KG from patient.

## 2021-07-16 NOTE — Progress Notes (Signed)
Patient completes dialysis session, without incident, CVC functioned well, maintained BFR. Fluid removed 3 liters, received Maxipime at end of treatment. Patient calm, cooperative, with improved mentation. Patient will return to floor.

## 2021-07-16 NOTE — ED Triage Notes (Addendum)
Pt arrived from home by EMS due to his left arm dialysis fistula bleeding x1 hour. Pt went to dialysis today, no issue during or post until tonight when pt noticed bleeding. EMS reports fire applied pressure dressing and bleeding continued through. EMS applied new dressing with no bleeding through on arrival to ED. Pt on 3L oxygen chronically. Pt was recently released from hospital post new fistula placement in groin due to infection to the current left arm fistula. Pt due for dialysis in AM.

## 2021-07-16 NOTE — Progress Notes (Signed)
PHARMACY - PHYSICIAN COMMUNICATION CRITICAL VALUE ALERT - BLOOD CULTURE IDENTIFICATION (BCID)  Jordan [name] is an 55 y.o. male who presented to [facility] on 07/12/2021 with a chief complaint of shortness of breath.  Assessment: Presented 10/21 with shortness of breath after missing three HD treatments. PMH includes ESRD on HD MWF, alcohol abuse and COPD. Recent admit 10/9 for f/o after missing HD. WBC 16.8, though afebrile. Per nephrology, pain over AVF with erythema and induration. BCID detected Enterobacterales (CTX-M ESBL Not Detected) and Serratia marcescens. Gram stain growing 1/2 GNR.   Recommendation: Gave cefepime 2 grams IV x 1 before patient left AMA. Appropriate frequency of cefepime would have been either every 24 hours or with HD.  Name of physician (or Provider) Contacted: Dr. Posey Pronto  Current antibiotics: None  Changes to prescribed antibiotics recommended:  Recommendations accepted by provider  Results for orders placed or performed during the hospital encounter of 07/12/21  Blood Culture ID Panel (Reflexed) (Collected: 07/15/2021 12:50 PM)  Result Value Ref Range   Enterococcus faecalis NOT DETECTED NOT DETECTED   Enterococcus Faecium NOT DETECTED NOT DETECTED   Listeria monocytogenes NOT DETECTED NOT DETECTED   Staphylococcus species NOT DETECTED NOT DETECTED   Staphylococcus aureus (BCID) NOT DETECTED NOT DETECTED   Staphylococcus epidermidis NOT DETECTED NOT DETECTED   Staphylococcus lugdunensis NOT DETECTED NOT DETECTED   Streptococcus species NOT DETECTED NOT DETECTED   Streptococcus agalactiae NOT DETECTED NOT DETECTED   Streptococcus pneumoniae NOT DETECTED NOT DETECTED   Streptococcus pyogenes NOT DETECTED NOT DETECTED   A.calcoaceticus-baumannii NOT DETECTED NOT DETECTED   Bacteroides fragilis NOT DETECTED NOT DETECTED   Enterobacterales DETECTED (A) NOT DETECTED   Enterobacter cloacae complex NOT DETECTED NOT DETECTED   Escherichia coli NOT DETECTED  NOT DETECTED   Klebsiella aerogenes NOT DETECTED NOT DETECTED   Klebsiella oxytoca NOT DETECTED NOT DETECTED   Klebsiella pneumoniae NOT DETECTED NOT DETECTED   Proteus species NOT DETECTED NOT DETECTED   Salmonella species NOT DETECTED NOT DETECTED   Serratia marcescens DETECTED (A) NOT DETECTED   Haemophilus influenzae NOT DETECTED NOT DETECTED   Neisseria meningitidis NOT DETECTED NOT DETECTED   Pseudomonas aeruginosa NOT DETECTED NOT DETECTED   Stenotrophomonas maltophilia NOT DETECTED NOT DETECTED   Candida albicans NOT DETECTED NOT DETECTED   Candida auris NOT DETECTED NOT DETECTED   Candida glabrata NOT DETECTED NOT DETECTED   Candida krusei NOT DETECTED NOT DETECTED   Candida parapsilosis NOT DETECTED NOT DETECTED   Candida tropicalis NOT DETECTED NOT DETECTED   Cryptococcus neoformans/gattii NOT DETECTED NOT DETECTED   CTX-M ESBL NOT DETECTED NOT DETECTED   Carbapenem resistance IMP NOT DETECTED NOT DETECTED   Carbapenem resistance KPC NOT DETECTED NOT DETECTED   Carbapenem resistance NDM NOT DETECTED NOT DETECTED   Carbapenem resist OXA 48 LIKE NOT DETECTED NOT DETECTED   Carbapenem resistance VIM NOT DETECTED NOT [place], PharmD Pharmacy Resident  07/16/2021 5:36 PM

## 2021-07-16 NOTE — Progress Notes (Signed)
[facility]  ROUNDING NOTE   Subjective:   Jordan [name] was admitted to [facility] on 07/12/2021 for Acute respiratory failure with hypoxia ([place]) [J96.01] ESRD on hemodialysis ([place]) [N18.6, Z99.2] AV fistula occlusion, initial encounter ([place]) [T82.898A] Acute on chronic respiratory failure with hypoxemia ([place]) [J96.21]   Patient seen and evaluated during dialysis   HEMODIALYSIS FLOWSHEET:  Blood Flow Rate (mL/min): 200 mL/min Arterial Pressure (mmHg): -80 mmHg Venous Pressure (mmHg): 100 mmHg Transmembrane Pressure (mmHg): 80 mmHg Ultrafiltration Rate (mL/min): 70 mL/min Dialysate Flow Rate (mL/min): 500 ml/min Conductivity: Machine : 13.5 Conductivity: Machine : 13.5 Dialysis Fluid Bolus: Normal Saline Bolus Amount (mL): 200 mL  No complaints at this time  Objective:  Vital signs in last 24 hours:  Temp:  [97.6 F (36.4 C)-99.3 F (37.4 C)] 98.3 F (36.8 C) (10/25 1145) Pulse Rate:  [36-183] 183 (10/25 1215) Resp:  [12-31] 25 (10/25 1215) BP: (104-143)/(68-115) 112/73 (10/25 1145) SpO2:  [31 %-100 %] 31 % (10/25 1215) Weight:  [74.9 kg-77.4 kg] 74.9 kg (10/25 1150)  Weight change:  Filed Weights   07/15/21 0958 07/16/21 0842 07/16/21 1150  Weight: 70.6 kg 77.4 kg 74.9 kg    Intake/Output: I/O last 3 completed shifts: In: 177 [P.O.:636; IV Piggyback:50] Out: 1 [Urine:1]   Intake/Output this shift:  Total I/O In: -  Out: 3000 [Other:3000]  Physical Exam: General: NAD  Head: Moist oral mucus membranes  Eyes: Anicteric  Lungs:  Remains diminished bilaterally, 4L [place] O2  Heart: Regular rate and rhythm   Abdomen:  Soft, nontender, distended  Extremities:  no peripheral edema.  Neurologic: Alert and oriented  Skin: Left upper arm edematous, painful to touch, erythema  Access: Left aneurysmal AVF, Rt femoral temp cath placed on 93/90/30    Basic Metabolic Panel: Recent Labs  Lab 07/12/21 1851 07/13/21 0155 07/13/21 0311 07/13/21 1317  07/15/21 1250 07/15/21 1503  NA 142  --  143 144 139 140  K 4.9  --  4.9 5.6* 4.3 4.1  CL 102  --  103 101 100 101  CO2 20*  --  21* 23 18* 20*  GLUCOSE 149*  --  126* 105* 61* 71  BUN 110*  --  108* 133* 85* 88*  CREATININE 14.11* 14.60* 14.43* 14.84* 10.01* 10.25*  CALCIUM 9.1  --  9.1 8.9 8.6* 8.6*  MG 2.6*  --   --   --   --   --   PHOS  --   --   --  9.6*  --  6.6*     Liver Function Tests: Recent Labs  Lab 07/12/21 1851 07/13/21 0311 07/13/21 1317 07/15/21 1503  AST 16 14*  --   --   ALT 16 14  --   --   ALKPHOS 108 106  --   --   BILITOT 1.5* 1.1  --   --   PROT 8.0 6.6  --   --   ALBUMIN 3.2* 2.8* 2.7* 2.7*    Recent Labs  Lab 07/12/21 1851  LIPASE 34    No results for input(s): AMMONIA in the last 168 hours.  CBC: Recent Labs  Lab 07/12/21 1851 07/13/21 0311 07/13/21 1317 07/15/21 1503 07/15/21 1928  WBC 13.9* 13.1* 12.4*  --  16.8*  NEUTROABS 12.3*  --   --   --   --   HGB 8.2* 7.2* 6.8* 7.0* 8.4*  HCT 25.7* 22.5* 22.6*  --  26.8*  MCV 98.1 97.8 98.7  --  100.8*  PLT 141* 123* 134*  --  133*     Cardiac Enzymes: No results for input(s): CKTOTAL, CKMB, CKMBINDEX, TROPONINI in the last 168 hours.  BNP: Invalid input(s): POCBNP  CBG: Recent Labs  Lab 07/13/21 0144  GLUCAP 91     Microbiology: Results for orders placed or performed during the hospital encounter of 07/12/21  Resp Panel by RT-PCR (Flu A&B, Covid) Nasopharyngeal Swab     Status: None   Collection Time: 07/12/21  6:51 PM   Specimen: Nasopharyngeal Swab; Nasopharyngeal(NP) swabs in vial transport medium  Result Value Ref Range Status   SARS Coronavirus 2 by RT PCR NEGATIVE NEGATIVE Final    Comment: (NOTE) SARS-CoV-2 target nucleic acids are NOT DETECTED.  The SARS-CoV-2 RNA is generally detectable in upper respiratory specimens during the acute phase of infection. The lowest concentration of SARS-CoV-2 viral copies this assay can detect is 138 copies/mL. A negative  result does not preclude SARS-Cov-2 infection and should not be used as the sole basis for treatment or other patient management decisions. A negative result may occur with  improper specimen collection/handling, submission of specimen other than nasopharyngeal swab, presence of viral mutation(s) within the areas targeted by this assay, and inadequate number of viral copies(<138 copies/mL). A negative result must be combined with clinical observations, patient history, and epidemiological information. The expected result is Negative.  Fact Sheet for Patients:  EntrepreneurPulse.com.au  Fact Sheet for Healthcare Providers:  IncredibleEmployment.be  This test is no t yet approved or cleared by the [place] FDA and  has been authorized for detection and/or diagnosis of SARS-CoV-2 by FDA under an Emergency Use Authorization (EUA). This EUA will remain  in effect (meaning this test can be used) for the duration of the COVID-19 declaration under Section 564(b)(1) of the Act, 21 U.S.C.section 360bbb-3(b)(1), unless the authorization is terminated  or revoked sooner.       Influenza A by PCR NEGATIVE NEGATIVE Final   Influenza B by PCR NEGATIVE NEGATIVE Final    Comment: (NOTE) The Xpert Xpress SARS-CoV-2/FLU/RSV plus assay is intended as an aid in the diagnosis of influenza from Nasopharyngeal swab specimens and should not be used as a sole basis for treatment. Nasal washings and aspirates are unacceptable for Xpert Xpress SARS-CoV-2/FLU/RSV testing.  Fact Sheet for Patients: EntrepreneurPulse.com.au  Fact Sheet for Healthcare Providers: IncredibleEmployment.be  This test is not yet approved or cleared by the [place] FDA and has been authorized for detection and/or diagnosis of SARS-CoV-2 by FDA under an Emergency Use Authorization (EUA). This EUA will remain in effect (meaning this test can be used)  for the duration of the COVID-19 declaration under Section 564(b)(1) of the Act, 21 U.S.C. section 360bbb-3(b)(1), unless the authorization is terminated or revoked.  Performed at [facility], [address], [place], [place] [address]   Surgical pcr screen     Status: None   Collection Time: 07/15/21  8:06 AM   Specimen: Nasal Mucosa; Nasal Swab  Result Value Ref Range Status   MRSA, PCR NEGATIVE NEGATIVE Final   Staphylococcus aureus NEGATIVE NEGATIVE Final    Comment: (NOTE) The Xpert SA Assay (FDA approved for NASAL specimens in patients 12 years of age and older), is one component of a comprehensive surveillance program. It is not intended to diagnose infection nor to guide or monitor treatment. Performed at [facility], [place], [place] [address]   CULTURE, BLOOD (ROUTINE X 2) w Reflex to ID Panel  Status: None (Preliminary result)   Collection Time: 07/15/21 12:21 PM   Specimen: BLOOD  Result Value Ref Range Status   Specimen Description BLOOD RIGHT ANTECUBITAL  Final   Special Requests   Final    BOTTLES DRAWN AEROBIC ONLY Blood Culture adequate volume   Culture  Setup Time   Final    AEROBIC BOTTLE ONLY GRAM NEGATIVE RODS CRITICAL VALUE NOTED.  VALUE IS CONSISTENT WITH PREVIOUSLY REPORTED AND CALLED VALUE. Performed at [facility], [address] [place], [place] [address]    Culture GRAM NEGATIVE RODS  Final   Report Status PENDING  Incomplete  CULTURE, BLOOD (ROUTINE X 2) w Reflex to ID Panel     Status: None (Preliminary result)   Collection Time: 07/15/21 12:50 PM   Specimen: BLOOD  Result Value Ref Range Status   Specimen Description BLOOD BLOOD RIGHT HAND  Final   Special Requests   Final    BOTTLES DRAWN AEROBIC ONLY Blood Culture results may not be optimal due to an inadequate volume of blood received in culture bottles   Culture  Setup Time   Final    GRAM NEGATIVE RODS AEROBIC BOTTLE ONLY Organism  ID to follow CRITICAL RESULT CALLED TO, READ BACK BY AND VERIFIED WITH: Eleonore Chiquito 07/16/21 [place] Performed at [facility], [address] [place], [place] [address]    Culture GRAM NEGATIVE RODS  Final   Report Status PENDING  Incomplete  Blood Culture ID Panel (Reflexed)     Status: Abnormal   Collection Time: 07/15/21 12:50 PM  Result Value Ref Range Status   Enterococcus faecalis NOT DETECTED NOT DETECTED Final   Enterococcus Faecium NOT DETECTED NOT DETECTED Final   Listeria monocytogenes NOT DETECTED NOT DETECTED Final   Staphylococcus species NOT DETECTED NOT DETECTED Final   Staphylococcus aureus (BCID) NOT DETECTED NOT DETECTED Final   Staphylococcus epidermidis NOT DETECTED NOT DETECTED Final   Staphylococcus lugdunensis NOT DETECTED NOT DETECTED Final   Streptococcus species NOT DETECTED NOT DETECTED Final   Streptococcus agalactiae NOT DETECTED NOT DETECTED Final   Streptococcus pneumoniae NOT DETECTED NOT DETECTED Final   Streptococcus pyogenes NOT DETECTED NOT DETECTED Final   A.calcoaceticus-baumannii NOT DETECTED NOT DETECTED Final   Bacteroides fragilis NOT DETECTED NOT DETECTED Final   Enterobacterales DETECTED (A) NOT DETECTED Final    Comment: Enterobacterales represent a large order of gram negative bacteria, not a single organism. CRITICAL RESULT CALLED TO, READ BACK BY AND VERIFIED WITH: Winfield Rast PATEL 07/16/21 0737 KLW    Enterobacter cloacae complex NOT DETECTED NOT DETECTED Final   Escherichia coli NOT DETECTED NOT DETECTED Final   Klebsiella aerogenes NOT DETECTED NOT DETECTED Final   Klebsiella oxytoca NOT DETECTED NOT DETECTED Final   Klebsiella pneumoniae NOT DETECTED NOT DETECTED Final   Proteus species NOT DETECTED NOT DETECTED Final   Salmonella species NOT DETECTED NOT DETECTED Final   Serratia marcescens DETECTED (A) NOT DETECTED Final    Comment: CRITICAL RESULT CALLED TO, READ BACK BY AND VERIFIED WITH: Winfield Rast PATEL 07/16/21 0737  KLW    Haemophilus influenzae NOT DETECTED NOT DETECTED Final   Neisseria meningitidis NOT DETECTED NOT DETECTED Final   Pseudomonas aeruginosa NOT DETECTED NOT DETECTED Final   Stenotrophomonas maltophilia NOT DETECTED NOT DETECTED Final   Candida albicans NOT DETECTED NOT DETECTED Final   Candida auris NOT DETECTED NOT DETECTED Final   Candida glabrata NOT DETECTED NOT DETECTED Final   Candida krusei NOT DETECTED NOT DETECTED Final  Candida parapsilosis NOT DETECTED NOT DETECTED Final   Candida tropicalis NOT DETECTED NOT DETECTED Final   Cryptococcus neoformans/gattii NOT DETECTED NOT DETECTED Final   CTX-M ESBL NOT DETECTED NOT DETECTED Final   Carbapenem resistance IMP NOT DETECTED NOT DETECTED Final   Carbapenem resistance KPC NOT DETECTED NOT DETECTED Final   Carbapenem resistance NDM NOT DETECTED NOT DETECTED Final   Carbapenem resist OXA 48 LIKE NOT DETECTED NOT DETECTED Final   Carbapenem resistance VIM NOT DETECTED NOT DETECTED Final    Comment: Performed at [facility], [address] [place], [place] [address]    Coagulation Studies: No results for input(s): LABPROT, INR in the last 72 hours.   Urinalysis: No results for input(s): COLORURINE, LABSPEC, PHURINE, GLUCOSEU, HGBUR, BILIRUBINUR, KETONESUR, PROTEINUR, UROBILINOGEN, NITRITE, LEUKOCYTESUR in the last 72 hours.  Invalid input(s): APPERANCEUR    Imaging: PERIPHERAL VASCULAR CATHETERIZATION  Result Date: 07/15/2021 See surgical note for result.  DG Chest Port 1 View  Result Date: 07/15/2021 CLINICAL DATA:  Chest pain EXAM: PORTABLE CHEST 1 VIEW COMPARISON:  07/12/2021, CT 05/03/2021, chest x-ray 06/14/2021, 05/28/2021, 03/20/2021, 10/01/2019 FINDINGS: Cardiomegaly with globular cardiac configuration. Aortic atherosclerosis. Diffuse bilateral ground-glass opacity. No pleural effusion or pneumothorax. IMPRESSION: 1. Cardiomegaly with globular cardiac configuration which may be due to combination  of chamber enlargement and pericardial effusion. 2. Diffuse bilateral ground-glass opacity, suspect underlying component of chronic lung disease but difficult to exclude mild superimposed edema Electronically Signed   By: Donavan Foil M.D.   On: 07/15/2021 16:03     Medications:    sodium chloride     sodium chloride       calcium acetate  1,334 mg Oral TID WC   Chlorhexidine Gluconate Cloth  6 each Topical Daily   cinacalcet  30 mg Oral Q breakfast   docusate sodium  100 mg Oral BID   ferrous sulfate  325 mg Oral BID WC   guaiFENesin  600 mg Oral BID   heparin  5,000 Units Subcutaneous Q8H   metoprolol tartrate  50 mg Oral BID   nicotine  21 mg Transdermal Daily   pantoprazole  20 mg Oral BID AC   [START ON 07/18/2021] permethrin   Topical Once     Assessment/ Plan:  Jordan [name] is a 55 y.o. black male with end stage renal disease on hemodialysis, hypertension, COPD, EtOH abuse, atrial fibrillation, congestive heart failure who is admitted to [facility] on Acute respiratory failure with hypoxia ([place]) [J96.01] ESRD on hemodialysis ([place]) [N18.6, Z99.2] AV fistula occlusion, initial encounter ([place]) [T82.898A] Acute on chronic respiratory failure with hypoxemia ([place]) [J96.21]   [facility] M[facility] [place]. Left AVF 67kg  End Stage Renal Disease on dialysis with fluid volume overload  - Received dialysis today, tolerated well. UF goal 3L achieved  - Appreciate vascular surgery performing Left arm fistulogram yesterday. Determined fistula was unsalvageable. Will evaluate alternate access later in week. Next treatment scheduled for Wednesday.  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  - Calcium below goal, phosphorus elevated - Continue calcium acetate with meals - continue cinacalcet daily  Anemia with chronic kidney disease:  getting outpatient Mircera as ESA.  - Awaiting updated labs    LOS: [place] 10/25/202212:28 PM

## 2021-07-17 ENCOUNTER — Emergency Department: Payer: Medicare Other

## 2021-07-17 ENCOUNTER — Encounter
Admission: EM | Discharge status: Against Medical Advice | Payer: Self-pay | Source: Home / Self Care | Attending: Student

## 2021-07-17 ENCOUNTER — Inpatient Hospital Stay: Payer: Medicare Other | Admitting: Anesthesiology

## 2021-07-17 ENCOUNTER — Other Ambulatory Visit: Payer: Self-pay

## 2021-07-17 ENCOUNTER — Encounter: Payer: Self-pay | Admitting: Internal Medicine

## 2021-07-17 ENCOUNTER — Inpatient Hospital Stay: Payer: Medicare Other

## 2021-07-17 DIAGNOSIS — R6521 Severe sepsis with septic shock: Secondary | ICD-10-CM | POA: Diagnosis not present

## 2021-07-17 DIAGNOSIS — A4153 Sepsis due to Serratia: Secondary | ICD-10-CM | POA: Diagnosis present

## 2021-07-17 DIAGNOSIS — L89312 Pressure ulcer of right buttock, stage 2: Secondary | ICD-10-CM | POA: Diagnosis present

## 2021-07-17 DIAGNOSIS — T82868D Thrombosis of vascular prosthetic devices, implants and grafts, subsequent encounter: Secondary | ICD-10-CM

## 2021-07-17 DIAGNOSIS — Z66 Do not resuscitate: Secondary | ICD-10-CM | POA: Diagnosis present

## 2021-07-17 DIAGNOSIS — T148XXA Other injury of unspecified body region, initial encounter: Secondary | ICD-10-CM | POA: Diagnosis not present

## 2021-07-17 DIAGNOSIS — E440 Moderate protein-calorie malnutrition: Secondary | ICD-10-CM | POA: Diagnosis present

## 2021-07-17 DIAGNOSIS — L89322 Pressure ulcer of left buttock, stage 2: Secondary | ICD-10-CM | POA: Diagnosis present

## 2021-07-17 DIAGNOSIS — T82838A Hemorrhage of vascular prosthetic devices, implants and grafts, initial encounter: Secondary | ICD-10-CM

## 2021-07-17 DIAGNOSIS — N186 End stage renal disease: Secondary | ICD-10-CM | POA: Diagnosis present

## 2021-07-17 DIAGNOSIS — D631 Anemia in chronic kidney disease: Secondary | ICD-10-CM | POA: Diagnosis present

## 2021-07-17 DIAGNOSIS — R7881 Bacteremia: Secondary | ICD-10-CM | POA: Diagnosis present

## 2021-07-17 DIAGNOSIS — D62 Acute posthemorrhagic anemia: Secondary | ICD-10-CM | POA: Diagnosis present

## 2021-07-17 DIAGNOSIS — I132 Hypertensive heart and chronic kidney disease with heart failure and with stage 5 chronic kidney disease, or end stage renal disease: Secondary | ICD-10-CM | POA: Diagnosis present

## 2021-07-17 DIAGNOSIS — N2581 Secondary hyperparathyroidism of renal origin: Secondary | ICD-10-CM | POA: Diagnosis present

## 2021-07-17 DIAGNOSIS — E872 Acidosis, unspecified: Secondary | ICD-10-CM | POA: Diagnosis present

## 2021-07-17 DIAGNOSIS — I5032 Chronic diastolic (congestive) heart failure: Secondary | ICD-10-CM | POA: Diagnosis present

## 2021-07-17 DIAGNOSIS — T827XXA Infection and inflammatory reaction due to other cardiac and vascular devices, implants and grafts, initial encounter: Secondary | ICD-10-CM | POA: Diagnosis present

## 2021-07-17 DIAGNOSIS — J449 Chronic obstructive pulmonary disease, unspecified: Secondary | ICD-10-CM | POA: Diagnosis present

## 2021-07-17 DIAGNOSIS — Y832 Surgical operation with anastomosis, bypass or graft as the cause of abnormal reaction of the patient, or of later complication, without mention of misadventure at the time of the procedure: Secondary | ICD-10-CM | POA: Diagnosis present

## 2021-07-17 DIAGNOSIS — G9341 Metabolic encephalopathy: Secondary | ICD-10-CM | POA: Diagnosis present

## 2021-07-17 DIAGNOSIS — L03114 Cellulitis of left upper limb: Secondary | ICD-10-CM | POA: Diagnosis present

## 2021-07-17 DIAGNOSIS — T827XXD Infection and inflammatory reaction due to other cardiac and vascular devices, implants and grafts, subsequent encounter: Secondary | ICD-10-CM

## 2021-07-17 DIAGNOSIS — Z992 Dependence on renal dialysis: Secondary | ICD-10-CM

## 2021-07-17 DIAGNOSIS — J95821 Acute postprocedural respiratory failure: Secondary | ICD-10-CM | POA: Diagnosis present

## 2021-07-17 DIAGNOSIS — Z9115 Patient's noncompliance with renal dialysis: Secondary | ICD-10-CM | POA: Diagnosis not present

## 2021-07-17 DIAGNOSIS — J9601 Acute respiratory failure with hypoxia: Secondary | ICD-10-CM | POA: Diagnosis not present

## 2021-07-17 DIAGNOSIS — Z20822 Contact with and (suspected) exposure to covid-19: Secondary | ICD-10-CM | POA: Diagnosis present

## 2021-07-17 HISTORY — PX: TEMPORARY DIALYSIS CATHETER: CATH118312

## 2021-07-17 HISTORY — PX: LIGATION OF ARTERIOVENOUS  FISTULA: SHX5948

## 2021-07-17 LAB — BLOOD GAS, ARTERIAL
Acid-base deficit: 4.9 mmol/L — ABNORMAL HIGH (ref 0.0–2.0)
Bicarbonate: 22 mmol/L (ref 20.0–28.0)
FIO2: 0.6
MECHVT: 450 mL
O2 Saturation: 87.9 %
PEEP: 8 cmH2O
Patient temperature: 37
RATE: 16 resp/min
pCO2 arterial: 49 mmHg — ABNORMAL HIGH (ref 32.0–48.0)
pH, Arterial: 7.26 — ABNORMAL LOW (ref 7.350–7.450)
pO2, Arterial: 63 mmHg — ABNORMAL LOW (ref 83.0–108.0)

## 2021-07-17 LAB — COMPREHENSIVE METABOLIC PANEL
ALT: 12 U/L (ref 0–44)
AST: 16 U/L (ref 15–41)
Albumin: 2.6 g/dL — ABNORMAL LOW (ref 3.5–5.0)
Alkaline Phosphatase: 117 U/L (ref 38–126)
Anion gap: 12 (ref 5–15)
BUN: 57 mg/dL — ABNORMAL HIGH (ref 6–20)
CO2: 26 mmol/L (ref 22–32)
Calcium: 8.3 mg/dL — ABNORMAL LOW (ref 8.9–10.3)
Chloride: 97 mmol/L — ABNORMAL LOW (ref 98–111)
Creatinine, Ser: 7.4 mg/dL — ABNORMAL HIGH (ref 0.61–1.24)
GFR, Estimated: 8 mL/min — ABNORMAL LOW (ref >60)
Glucose, Bld: 101 mg/dL — ABNORMAL HIGH (ref 70–99)
Potassium: 3.5 mmol/L (ref 3.5–5.1)
Sodium: 135 mmol/L (ref 135–145)
Total Bilirubin: 0.9 mg/dL (ref 0.3–1.2)
Total Protein: 6.6 g/dL (ref 6.5–8.1)

## 2021-07-17 LAB — CBC WITH DIFFERENTIAL/PLATELET
Abs Immature Granulocytes: 0.12 10*3/uL — ABNORMAL HIGH (ref 0.00–0.07)
Basophils Absolute: 0 10*3/uL (ref 0.0–0.1)
Basophils Relative: 0 %
Eosinophils Absolute: 0.1 10*3/uL (ref 0.0–0.5)
Eosinophils Relative: 1 %
HCT: 20.2 % — ABNORMAL LOW (ref 39.0–52.0)
Hemoglobin: 6.5 g/dL — ABNORMAL LOW (ref 13.0–17.0)
Immature Granulocytes: 1 %
Lymphocytes Relative: 4 %
Lymphs Abs: 0.4 10*3/uL — ABNORMAL LOW (ref 0.7–4.0)
MCH: 30.5 pg (ref 26.0–34.0)
MCHC: 32.2 g/dL (ref 30.0–36.0)
MCV: 94.8 fL (ref 80.0–100.0)
Monocytes Absolute: 1 10*3/uL (ref 0.1–1.0)
Monocytes Relative: 11 %
Neutro Abs: 7.5 10*3/uL (ref 1.7–7.7)
Neutrophils Relative %: 83 %
Platelets: 130 10*3/uL — ABNORMAL LOW (ref 150–400)
RBC: 2.13 MIL/uL — ABNORMAL LOW (ref 4.22–5.81)
RDW: 16.2 % — ABNORMAL HIGH (ref 11.5–15.5)
WBC: 9.1 10*3/uL (ref 4.0–10.5)
nRBC: 0 % (ref 0.0–0.2)

## 2021-07-17 LAB — LACTIC ACID, PLASMA
Lactic Acid, Venous: 1.5 mmol/L (ref 0.5–1.9)
Lactic Acid, Venous: 1.2 mmol/L (ref 0.5–1.9)

## 2021-07-17 LAB — PROTIME-INR
INR: 1.4 — ABNORMAL HIGH (ref 0.8–1.2)
Prothrombin Time: 17.1 seconds — ABNORMAL HIGH (ref 11.4–15.2)

## 2021-07-17 LAB — RESP PANEL BY RT-PCR (FLU A&B, COVID) ARPGX2
Influenza A by PCR: NEGATIVE
Influenza B by PCR: NEGATIVE
SARS Coronavirus 2 by RT PCR: NEGATIVE

## 2021-07-17 LAB — PHOSPHORUS: Phosphorus: 5.2 mg/dL — ABNORMAL HIGH (ref 2.5–4.6)

## 2021-07-17 LAB — HEMOGLOBIN AND HEMATOCRIT, BLOOD
HCT: 22.5 % — ABNORMAL LOW (ref 39.0–52.0)
Hemoglobin: 7.3 g/dL — ABNORMAL LOW (ref 13.0–17.0)

## 2021-07-17 LAB — APTT: aPTT: 39 seconds — ABNORMAL HIGH (ref 24–36)

## 2021-07-17 LAB — PREPARE RBC (CROSSMATCH)

## 2021-07-17 LAB — GLUCOSE, CAPILLARY: Glucose-Capillary: 80 mg/dL (ref 70–99)

## 2021-07-17 LAB — MAGNESIUM: Magnesium: 1.8 mg/dL (ref 1.7–2.4)

## 2021-07-17 IMAGING — DX DG CHEST 1V PORT
1 series · 1 of 1 positions shown · non-contrast
Comparison: Chest radiograph dated [DATE].

CLINICAL DATA: Respiratory failure

EXAM:
PORTABLE CHEST 1 VIEW

[chest ap]
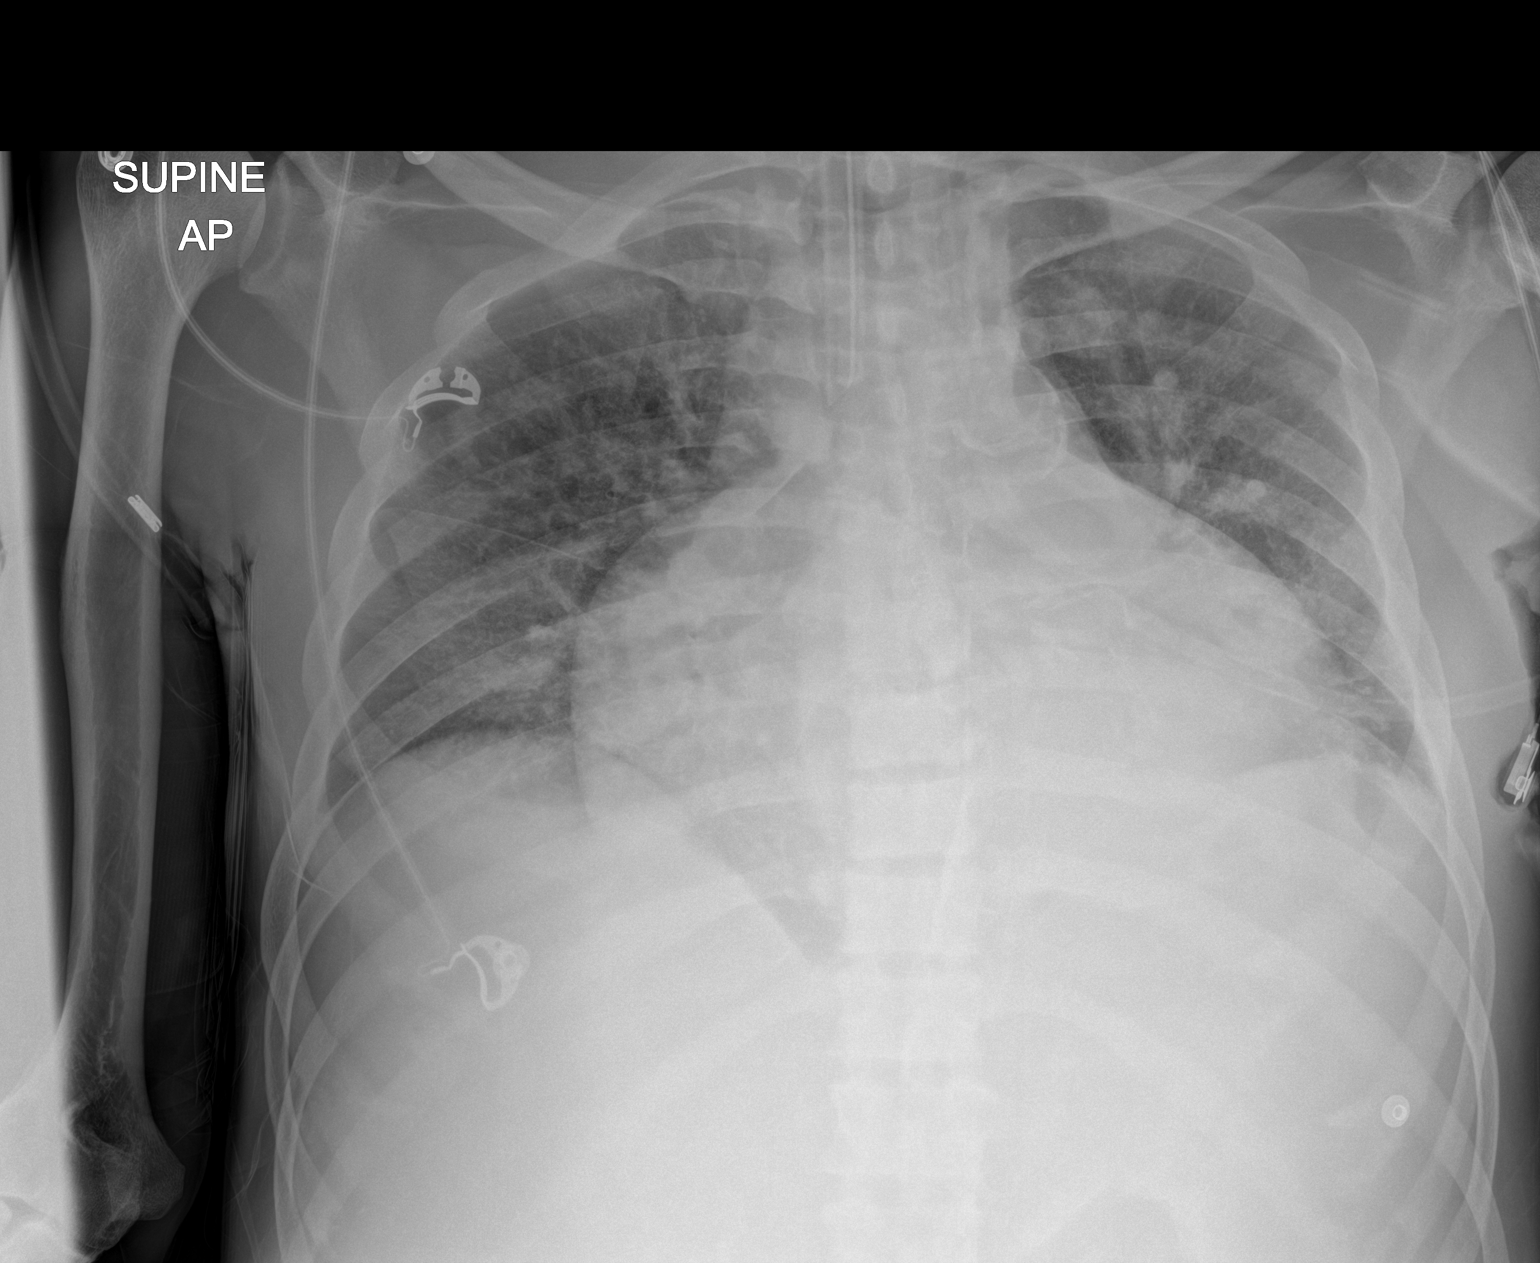

[1 of 1 positions shown; findings below may reference images not displayed]

FINDINGS: Endotracheal tube with tip approximately 2.5 cm above the carina.
There is cardiomegaly with vascular congestion. No focal
consolidation, pleural effusion or pneumothorax. Atherosclerotic
calcification of the aorta. No acute osseous pathology.
IMPRESSION: Cardiomegaly with vascular congestion. No focal consolidation.

## 2021-07-17 IMAGING — DX DG CHEST 1V PORT
1 series · 1 of 1 positions shown · non-contrast
Comparison: [DATE]

CLINICAL DATA: Bleeding of left arm dialysis fistula.

EXAM:
PORTABLE CHEST 1 VIEW

[chest ap]
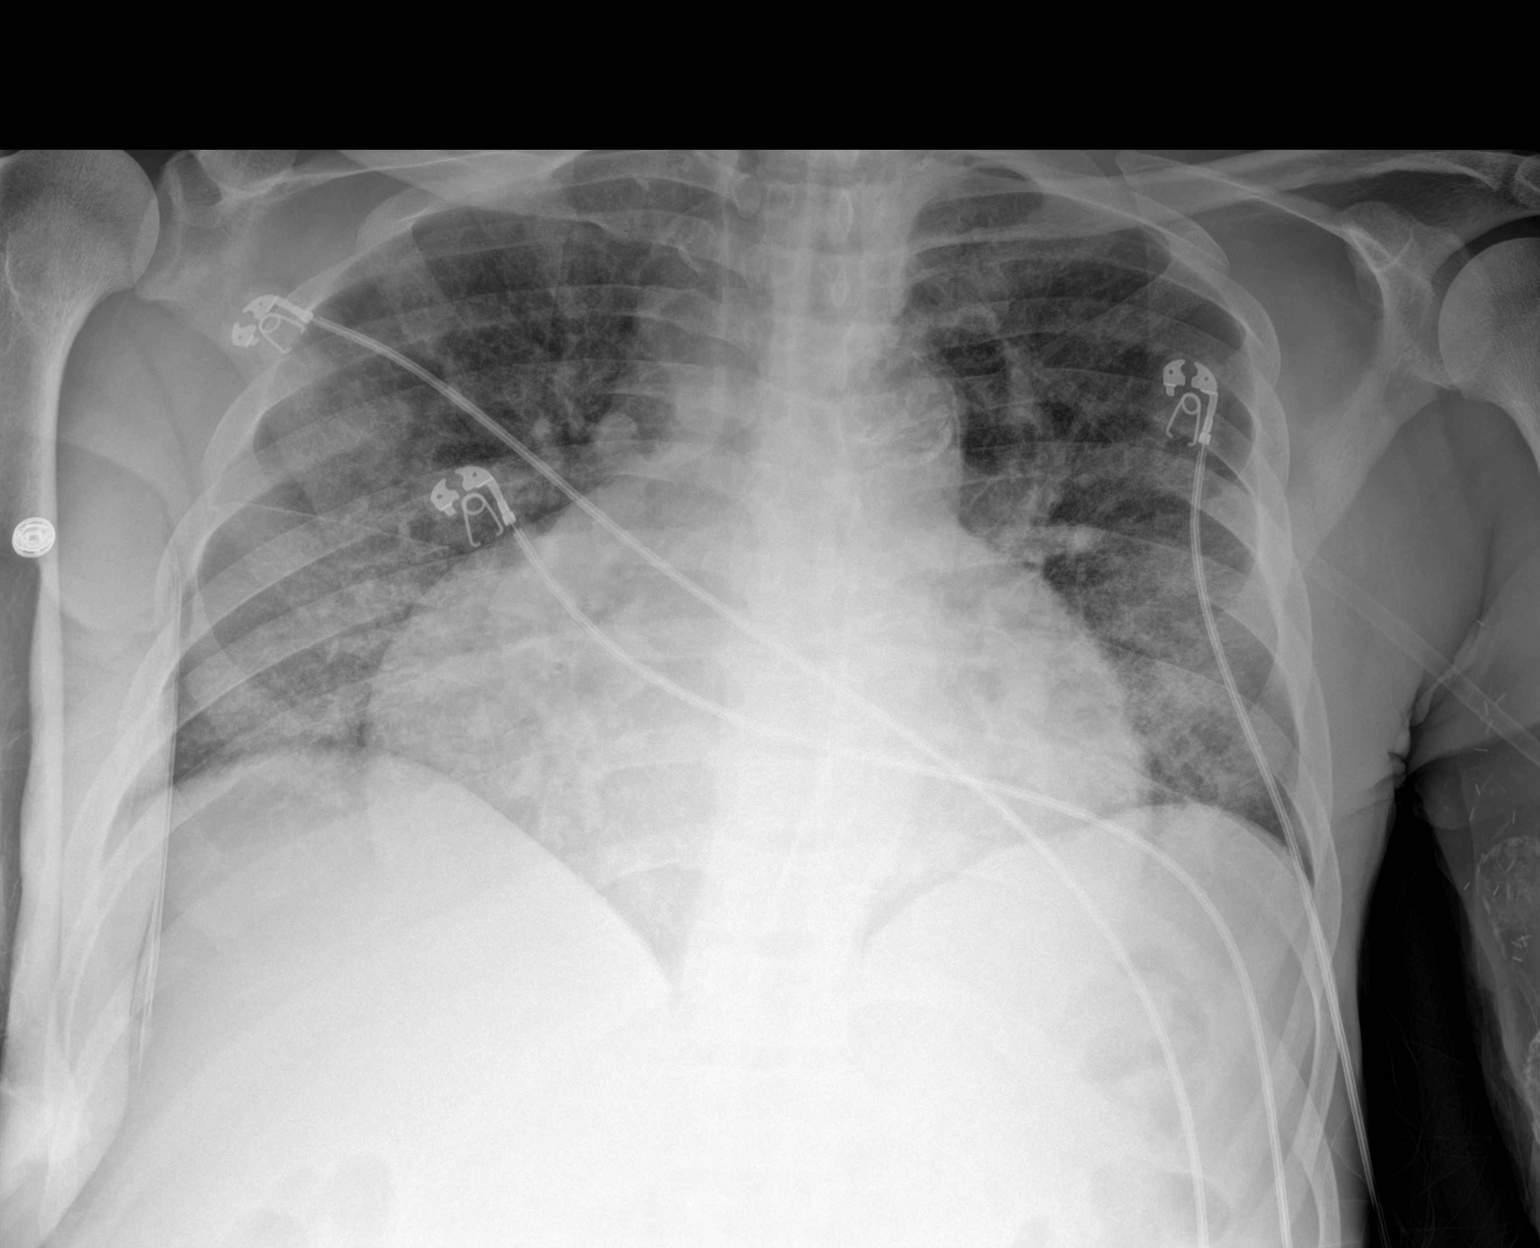

[1 of 1 positions shown; findings below may reference images not displayed]

FINDINGS: Mild, stable diffusely increased interstitial lung markings are
seen. There is no evidence of a pleural effusion or pneumothorax.
The cardiac silhouette is markedly enlarged and unchanged in size.
Marked severity calcification of the thoracic aorta is noted. The
visualized skeletal structures are unremarkable.
IMPRESSION: 1. Stable cardiomegaly.
2. Mild, stable, diffusely increased interstitial lung markings,
which are likely, in part, chronic in nature. A mild superimposed
component of interstitial edema cannot be excluded.

## 2021-07-17 SURGERY — LIGATION OF ARTERIOVENOUS  FISTULA
Anesthesia: General | Site: Groin | Laterality: Left

## 2021-07-17 MED ORDER — ONDANSETRON HCL 4 MG/2ML IJ SOLN: 4.0000 mg | Freq: Four times a day (QID) | INTRAMUSCULAR | Status: DC | PRN

## 2021-07-17 MED ORDER — FENTANYL CITRATE (PF) 100 MCG/2ML IJ SOLN
INTRAMUSCULAR | Status: DC | PRN
  Administered 2021-07-17 (×2): 50 ug via INTRAVENOUS

## 2021-07-17 MED ORDER — PROPOFOL 10 MG/ML IV BOLUS
INTRAVENOUS | Status: AC
  Filled 2021-07-17: qty 20

## 2021-07-17 MED ORDER — DEXMEDETOMIDINE HCL IN NACL 400 MCG/100ML IV SOLN
0.0000 ug/kg/h | INTRAVENOUS | Status: DC
  Administered 2021-07-17: 0.4 ug/kg/h via INTRAVENOUS
  Administered 2021-07-18: 0.3 ug/kg/h via INTRAVENOUS
  Administered 2021-07-18 – 2021-07-19 (×2): 0.8 ug/kg/h via INTRAVENOUS
  Administered 2021-07-19: 1 ug/kg/h via INTRAVENOUS
  Filled 2021-07-17 (×6): qty 100

## 2021-07-17 MED ORDER — DEXMEDETOMIDINE (PRECEDEX) IN NS 20 MCG/5ML (4 MCG/ML) IV SYRINGE
PREFILLED_SYRINGE | INTRAVENOUS | Status: DC | PRN
  Administered 2021-07-17 (×2): 4 ug via INTRAVENOUS

## 2021-07-17 MED ORDER — BUPIVACAINE LIPOSOME 1.3 % IJ SUSP
INTRAMUSCULAR | Status: DC | PRN
  Administered 2021-07-17: 35 mL via SURGICAL_CAVITY

## 2021-07-17 MED ORDER — BUPIVACAINE LIPOSOME 1.3 % IJ SUSP
INTRAMUSCULAR | Status: AC
  Filled 2021-07-17: qty 20

## 2021-07-17 MED ORDER — POLYETHYLENE GLYCOL 3350 17 G PO PACK
17.0000 g | PACK | Freq: Every day | ORAL | Status: DC
  Administered 2021-07-20: 17 g
  Filled 2021-07-17 (×3): qty 1

## 2021-07-17 MED ORDER — ACETAMINOPHEN 650 MG RE SUPP
650.0000 mg | Freq: Four times a day (QID) | RECTAL | Status: DC | PRN
  Filled 2021-07-17: qty 1

## 2021-07-17 MED ORDER — VANCOMYCIN HCL 1750 MG/350ML IV SOLN
1750.0000 mg | Freq: Once | INTRAVENOUS | Status: AC
  Administered 2021-07-17: 1750 mg via INTRAVENOUS
  Filled 2021-07-17: qty 350

## 2021-07-17 MED ORDER — DEXAMETHASONE SODIUM PHOSPHATE 10 MG/ML IJ SOLN
INTRAMUSCULAR | Status: DC | PRN
  Administered 2021-07-17: 5 mg via INTRAVENOUS

## 2021-07-17 MED ORDER — SUCCINYLCHOLINE CHLORIDE 200 MG/10ML IV SOSY
PREFILLED_SYRINGE | INTRAVENOUS | Status: DC | PRN
  Administered 2021-07-17: 100 mg via INTRAVENOUS
  Administered 2021-07-17: 80 mg via INTRAVENOUS

## 2021-07-17 MED ORDER — FENTANYL CITRATE (PF) 100 MCG/2ML IJ SOLN
INTRAMUSCULAR | Status: AC
  Filled 2021-07-17: qty 2

## 2021-07-17 MED ORDER — HEPARIN SODIUM (PORCINE) 5000 UNIT/ML IJ SOLN
INTRAMUSCULAR | Status: AC
  Filled 2021-07-17: qty 1

## 2021-07-17 MED ORDER — ROCURONIUM BROMIDE 100 MG/10ML IV SOLN
INTRAVENOUS | Status: DC | PRN
  Administered 2021-07-17: 20 mg via INTRAVENOUS

## 2021-07-17 MED ORDER — OXYCODONE HCL 5 MG PO TABS
5.0000 mg | ORAL_TABLET | Freq: Once | ORAL | Status: DC | PRN
Start: 2021-07-17 — End: 2021-07-17

## 2021-07-17 MED ORDER — ATROPINE SULFATE 1 MG/10ML IJ SOSY
PREFILLED_SYRINGE | INTRAMUSCULAR | Status: AC
  Filled 2021-07-17: qty 10

## 2021-07-17 MED ORDER — ALBUMIN HUMAN 5 % IV SOLN
INTRAVENOUS | Status: AC
  Filled 2021-07-17: qty 500

## 2021-07-17 MED ORDER — FENTANYL BOLUS VIA INFUSION
50.0000 ug | INTRAVENOUS | Status: DC | PRN
  Administered 2021-07-17: 50 ug via INTRAVENOUS
  Administered 2021-07-19: 100 ug via INTRAVENOUS
  Filled 2021-07-17: qty 100

## 2021-07-17 MED ORDER — ONDANSETRON HCL 4 MG PO TABS: 4.0000 mg | ORAL_TABLET | Freq: Four times a day (QID) | ORAL | Status: DC | PRN

## 2021-07-17 MED ORDER — OXYCODONE HCL 5 MG/5ML PO SOLN: 5.0000 mg | Freq: Once | ORAL | Status: DC | PRN

## 2021-07-17 MED ORDER — PROPOFOL 10 MG/ML IV BOLUS
INTRAVENOUS | Status: DC | PRN
  Administered 2021-07-17: 140 mg via INTRAVENOUS
  Administered 2021-07-17: 50 mg via INTRAVENOUS
  Administered 2021-07-17: 30 mg via INTRAVENOUS

## 2021-07-17 MED ORDER — OXYCODONE-ACETAMINOPHEN 5-325 MG PO TABS
1.0000 | ORAL_TABLET | Freq: Four times a day (QID) | ORAL | Status: DC | PRN
  Administered 2021-07-20 (×2): 1 via ORAL
  Administered 2021-07-21 (×3): 2 via ORAL
  Filled 2021-07-17 (×2): qty 2
  Filled 2021-07-17: qty 1
  Filled 2021-07-17: qty 2
  Filled 2021-07-17: qty 1
  Filled 2021-07-17 (×2): qty 2

## 2021-07-17 MED ORDER — ACETAMINOPHEN 10 MG/ML IV SOLN: 1000.0000 mg | Freq: Once | INTRAVENOUS | Status: DC | PRN

## 2021-07-17 MED ORDER — EPHEDRINE SULFATE 50 MG/ML IJ SOLN
INTRAMUSCULAR | Status: DC | PRN
  Administered 2021-07-17 (×5): 5 mg via INTRAVENOUS

## 2021-07-17 MED ORDER — ONDANSETRON HCL 4 MG/2ML IJ SOLN: 4.0000 mg | Freq: Once | INTRAMUSCULAR | Status: DC | PRN

## 2021-07-17 MED ORDER — LACTATED RINGERS IV SOLN: INTRAVENOUS | Status: DC

## 2021-07-17 MED ORDER — SUGAMMADEX SODIUM 200 MG/2ML IV SOLN
INTRAVENOUS | Status: DC | PRN
  Administered 2021-07-17: 200 mg via INTRAVENOUS

## 2021-07-17 MED ORDER — SODIUM CHLORIDE 0.9 % IV SOLN
1.0000 g | INTRAVENOUS | Status: DC
  Filled 2021-07-17: qty 1

## 2021-07-17 MED ORDER — HEPARIN SODIUM (PORCINE) 1000 UNIT/ML IJ SOLN
INTRAMUSCULAR | Status: AC
  Filled 2021-07-17: qty 1

## 2021-07-17 MED ORDER — CETIRIZINE HCL 10 MG PO TABS
5.0000 mg | ORAL_TABLET | Freq: Once | ORAL | Status: AC
  Administered 2021-07-17: 5 mg via ORAL
  Filled 2021-07-17: qty 1

## 2021-07-17 MED ORDER — DEXAMETHASONE SODIUM PHOSPHATE 10 MG/ML IJ SOLN
INTRAMUSCULAR | Status: AC
  Filled 2021-07-17: qty 1

## 2021-07-17 MED ORDER — PHENYLEPHRINE HCL-NACL 20-0.9 MG/250ML-% IV SOLN
INTRAVENOUS | Status: AC
  Filled 2021-07-17: qty 250

## 2021-07-17 MED ORDER — DOCUSATE SODIUM 50 MG/5ML PO LIQD
100.0000 mg | Freq: Two times a day (BID) | ORAL | Status: DC
  Administered 2021-07-20 – 2021-07-21 (×2): 100 mg
  Filled 2021-07-17 (×7): qty 10

## 2021-07-17 MED ORDER — PHENYLEPHRINE HCL-NACL 20-0.9 MG/250ML-% IV SOLN
0.0000 ug/min | INTRAVENOUS | Status: DC
  Administered 2021-07-17: 100 ug/min via INTRAVENOUS

## 2021-07-17 MED ORDER — SODIUM CHLORIDE 0.9 % IV SOLN: INTRAVENOUS | Status: DC | PRN

## 2021-07-17 MED ORDER — FENTANYL 2500MCG IN NS 250ML (10MCG/ML) PREMIX INFUSION
50.0000 ug/h | INTRAVENOUS | Status: DC
  Administered 2021-07-17: 50 ug/h via INTRAVENOUS
  Administered 2021-07-19: 125 ug/h via INTRAVENOUS
  Filled 2021-07-17 (×2): qty 250

## 2021-07-17 MED ORDER — EPHEDRINE 5 MG/ML INJ
INTRAVENOUS | Status: AC
  Filled 2021-07-17: qty 5

## 2021-07-17 MED ORDER — LIDOCAINE HCL (CARDIAC) PF 100 MG/5ML IV SOSY
PREFILLED_SYRINGE | INTRAVENOUS | Status: DC | PRN
  Administered 2021-07-17: 80 mg via INTRAVENOUS

## 2021-07-17 MED ORDER — VASOPRESSIN 20 UNIT/ML IV SOLN
INTRAVENOUS | Status: DC | PRN
  Administered 2021-07-17: 2 [IU] via INTRAVENOUS
  Administered 2021-07-17: 1 [IU] via INTRAVENOUS
  Administered 2021-07-17: 2 [IU] via INTRAVENOUS
  Administered 2021-07-17: 1 [IU] via INTRAVENOUS
  Administered 2021-07-17 (×3): 2 [IU] via INTRAVENOUS
  Administered 2021-07-17 (×2): 1 [IU] via INTRAVENOUS
  Administered 2021-07-17: 2 [IU] via INTRAVENOUS
  Administered 2021-07-17: 1 [IU] via INTRAVENOUS
  Administered 2021-07-17: .5 [IU] via INTRAVENOUS
  Administered 2021-07-17: 2 [IU] via INTRAVENOUS

## 2021-07-17 MED ORDER — PHENYLEPHRINE HCL-NACL 20-0.9 MG/250ML-% IV SOLN
INTRAVENOUS | Status: DC | PRN
  Administered 2021-07-17: 50 ug/min via INTRAVENOUS

## 2021-07-17 MED ORDER — FENTANYL CITRATE PF 50 MCG/ML IJ SOSY
50.0000 ug | PREFILLED_SYRINGE | Freq: Once | INTRAMUSCULAR | Status: AC
Start: 2021-07-17 — End: 2021-07-17
  Administered 2021-07-17: 50 ug via INTRAVENOUS
  Filled 2021-07-17: qty 1

## 2021-07-17 MED ORDER — ROCURONIUM BROMIDE 10 MG/ML (PF) SYRINGE
PREFILLED_SYRINGE | INTRAVENOUS | Status: AC
  Filled 2021-07-17: qty 10

## 2021-07-17 MED ORDER — KETAMINE HCL 50 MG/5ML IJ SOSY
PREFILLED_SYRINGE | INTRAMUSCULAR | Status: AC
  Filled 2021-07-17: qty 5

## 2021-07-17 MED ORDER — 0.9 % SODIUM CHLORIDE (POUR BTL) OPTIME: TOPICAL | Status: DC | PRN

## 2021-07-17 MED ORDER — SODIUM CHLORIDE 0.9 % IR SOLN
Status: DC | PRN
  Administered 2021-07-17: 400 mL

## 2021-07-17 MED ORDER — ACETAMINOPHEN 325 MG PO TABS: 650.0000 mg | ORAL_TABLET | Freq: Four times a day (QID) | ORAL | Status: DC | PRN

## 2021-07-17 MED ORDER — HYDROXYZINE HCL 25 MG PO TABS
25.0000 mg | ORAL_TABLET | Freq: Three times a day (TID) | ORAL | Status: DC | PRN
  Administered 2021-07-17 – 2021-07-22 (×7): 25 mg via ORAL
  Filled 2021-07-17 (×8): qty 1

## 2021-07-17 MED ORDER — ONDANSETRON HCL 4 MG/2ML IJ SOLN
INTRAMUSCULAR | Status: DC | PRN
Start: 2021-07-17 — End: 2021-07-17
  Administered 2021-07-17: 4 mg via INTRAVENOUS

## 2021-07-17 MED ORDER — ONDANSETRON HCL 4 MG/2ML IJ SOLN
INTRAMUSCULAR | Status: AC
  Filled 2021-07-17: qty 2

## 2021-07-17 MED ORDER — LIDOCAINE HCL (PF) 2 % IJ SOLN
INTRAMUSCULAR | Status: AC
  Filled 2021-07-17: qty 5

## 2021-07-17 MED ORDER — SODIUM CHLORIDE 0.9 % IV SOLN
2.0000 g | INTRAVENOUS | Status: AC
  Administered 2021-07-17: 2 g via INTRAVENOUS
  Filled 2021-07-17: qty 2

## 2021-07-17 MED ORDER — NOREPINEPHRINE 4 MG/250ML-% IV SOLN
0.0000 ug/min | INTRAVENOUS | Status: DC
  Administered 2021-07-17: 2 ug/min via INTRAVENOUS
  Filled 2021-07-17: qty 250

## 2021-07-17 MED ORDER — BUPIVACAINE HCL (PF) 0.5 % IJ SOLN
INTRAMUSCULAR | Status: AC
  Filled 2021-07-17: qty 30

## 2021-07-17 MED ORDER — MIDAZOLAM HCL 2 MG/2ML IJ SOLN
INTRAMUSCULAR | Status: AC
  Filled 2021-07-17: qty 2

## 2021-07-17 MED ORDER — FENTANYL CITRATE (PF) 100 MCG/2ML IJ SOLN: 25.0000 ug | INTRAMUSCULAR | Status: DC | PRN

## 2021-07-17 MED ORDER — CHLORHEXIDINE GLUCONATE CLOTH 2 % EX PADS
6.0000 | MEDICATED_PAD | Freq: Every day | CUTANEOUS | Status: DC
  Administered 2021-07-18 – 2021-07-21 (×2): 6 via TOPICAL

## 2021-07-17 MED ORDER — PHENYLEPHRINE HCL (PRESSORS) 10 MG/ML IV SOLN
INTRAVENOUS | Status: AC
  Filled 2021-07-17: qty 1

## 2021-07-17 MED ORDER — DEXMEDETOMIDINE (PRECEDEX) IN NS 20 MCG/5ML (4 MCG/ML) IV SYRINGE
PREFILLED_SYRINGE | INTRAVENOUS | Status: AC
  Filled 2021-07-17: qty 5

## 2021-07-17 MED ORDER — MORPHINE SULFATE (PF) 2 MG/ML IV SOLN: 2.0000 mg | INTRAVENOUS | Status: DC | PRN

## 2021-07-17 SURGICAL SUPPLY — 54 items
ADH SKN CLS APL DERMABOND .7 (GAUZE/BANDAGES/DRESSINGS) ×2
BAG DECANTER FOR FLEXI CONT (MISCELLANEOUS) ×3 IMPLANT
BIOPATCH WHT 1IN DISK W/4.0 H (GAUZE/BANDAGES/DRESSINGS) ×1 IMPLANT
BLADE SURG SZ11 CARB STEEL (BLADE) ×3 IMPLANT
BNDG GAUZE ELAST 4 BULKY (GAUZE/BANDAGES/DRESSINGS) ×1 IMPLANT
BOOT SUTURE AID YELLOW STND (SUTURE) ×1 IMPLANT
CNTNR SPEC 2.5X3XGRAD LEK (MISCELLANEOUS) ×2
CONT SPEC 4OZ STER OR WHT (MISCELLANEOUS) ×1
CONT SPEC 4OZ STRL OR WHT (MISCELLANEOUS) ×2
CONTAINER SPEC 2.5X3XGRAD LEK (MISCELLANEOUS) IMPLANT
COVER PROBE FLX POLY STRL (MISCELLANEOUS) ×1 IMPLANT
CUP MEDICINE 2OZ PLAST GRAD ST (MISCELLANEOUS) ×1 IMPLANT
DERMABOND ADVANCED (GAUZE/BANDAGES/DRESSINGS) ×1
DERMABOND ADVANCED .7 DNX12 (GAUZE/BANDAGES/DRESSINGS) ×2 IMPLANT
DRESSING SURGICEL FIBRLLR 1X2 (HEMOSTASIS) ×2 IMPLANT
DRSG SURGICEL FIBRILLAR 1X2 (HEMOSTASIS) ×3
ELECT CAUTERY BLADE 6.4 (BLADE) ×3 IMPLANT
ELECT REM PT RETURN 9FT ADLT (ELECTROSURGICAL) ×3
ELECTRODE REM PT RTRN 9FT ADLT (ELECTROSURGICAL) ×2 IMPLANT
GAUZE 4X4 16PLY ~~LOC~~+RFID DBL (SPONGE) ×3 IMPLANT
GAUZE PACKING IODOFORM 1/2 (PACKING) ×1 IMPLANT
GAUZE SPONGE 4X4 12PLY STRL (GAUZE/BANDAGES/DRESSINGS) ×2 IMPLANT
GLOVE SURG SYN 8.0 (GLOVE) ×3 IMPLANT
GLOVE SURG SYN 8.0 PF PI (GLOVE) ×2 IMPLANT
GOWN STRL REUS W/ TWL LRG LVL3 (GOWN DISPOSABLE) ×2 IMPLANT
GOWN STRL REUS W/ TWL XL LVL3 (GOWN DISPOSABLE) ×4 IMPLANT
GOWN STRL REUS W/TWL LRG LVL3 (GOWN DISPOSABLE) ×3
GOWN STRL REUS W/TWL XL LVL3 (GOWN DISPOSABLE) ×6
IV NS 500ML (IV SOLUTION) ×3
IV NS 500ML BAXH (IV SOLUTION) ×2 IMPLANT
KIT DIALYSIS CATH TRI 30X13 (CATHETERS) ×1 IMPLANT
KIT TURNOVER KIT A (KITS) ×3 IMPLANT
LABEL OR SOLS (LABEL) ×3 IMPLANT
MANIFOLD NEPTUNE II (INSTRUMENTS) ×3 IMPLANT
NDL FILTER BLUNT 18X1 1/2 (NEEDLE) ×2 IMPLANT
NEEDLE FILTER BLUNT 18X 1/2SAF (NEEDLE) ×1
NEEDLE FILTER BLUNT 18X1 1/2 (NEEDLE) ×2 IMPLANT
PACK EXTREMITY ARMC (MISCELLANEOUS) ×3 IMPLANT
PAD PREP 24X41 OB/GYN DISP (PERSONAL CARE ITEMS) ×3 IMPLANT
SOL PREP PVP 2OZ (MISCELLANEOUS) ×6
SOLUTION PREP PVP 2OZ (MISCELLANEOUS) IMPLANT
SPONGE T-LAP 18X18 ~~LOC~~+RFID (SPONGE) ×2 IMPLANT
STOCKINETTE 48X4 2 PLY STRL (GAUZE/BANDAGES/DRESSINGS) ×2 IMPLANT
STOCKINETTE STRL 4IN 9604848 (GAUZE/BANDAGES/DRESSINGS) ×3 IMPLANT
SUT ETHILON 3 0 PS 1 (SUTURE) ×5 IMPLANT
SUT MNCRL+ 5-0 UNDYED PC-3 (SUTURE) ×2 IMPLANT
SUT MONOCRYL 5-0 (SUTURE) ×3
SUT PROLENE 5 0 RB 1 DA (SUTURE) ×2 IMPLANT
SUT VIC AB 3-0 SH 27 (SUTURE) ×6
SUT VIC AB 3-0 SH 27X BRD (SUTURE) ×2 IMPLANT
SWAB CULTURE AMIES ANAERIB BLU (MISCELLANEOUS) ×1 IMPLANT
SYR 20ML LL LF (SYRINGE) ×3 IMPLANT
SYR 3ML LL SCALE MARK (SYRINGE) ×3 IMPLANT
WATER STERILE IRR 500ML POUR (IV SOLUTION) ×3 IMPLANT

## 2021-07-17 NOTE — Progress Notes (Signed)
eLink Physician-Brief Progress Note Patient Name: Jordan [name] DOB: [birth year] MRN: [id number]   Date of Service  07/17/2021  HPI/Events of Note  55 year old man with ESRD and now admitted to ICU following OR procedure for AV fistula. ON pressors and also with bacteremia. PCCM note reviewed, they placed orders.   eICU Interventions  CXR reviewed and ETT is in good position. Nephro per HD. Continue vent support, sedation while on vent and pressors/antibiotics as ordered. Call E link if needed.      Intervention Category Major Interventions: Respiratory failure - evaluation and management;Sepsis - evaluation and management Evaluation Type: New Patient Evaluation  Margaretmary Lombard 07/17/2021, 9:05 PM

## 2021-07-17 NOTE — ED Notes (Signed)
Pt provided bedside toilet

## 2021-07-17 NOTE — Discharge Summary (Signed)
Pt was admitted with Left HD access malfunctioning and pulmonary edema from issing HD for >1 week. Temp HD access was obtained and pt was dialyzed Pt needed to new HD access however he decided to leaved AMA even after explaining him the need to stay for IV abxs and to get new HD access.

## 2021-07-17 NOTE — Transfer of Care (Signed)
Immediate Anesthesia Transfer of Care Note  Patient: Jordan [name]  Procedure(s) Performed: LIGATION OF ARTERIOVENOUS  FISTULA (Left) TEMPORARY DIALYSIS CATHETER (Left: Groin)  Patient Location: ICU  Anesthesia Type:General  Level of Consciousness: unresponsive and Patient remains intubated per anesthesia plan  Airway & Oxygen Therapy: Patient remains intubated per anesthesia plan and Patient placed on Ventilator (see vital sign flow sheet for setting)  Post-op Assessment: Report given to RN and Post -op Vital signs reviewed and stable  Post vital signs: Reviewed and stable  Last Vitals:  Vitals Value Taken Time  BP 79/61 07/17/21 1915  Temp 36.4 C 07/17/21 1855  Pulse 75 07/17/21 1919  Resp 16 07/17/21 1919  SpO2 91 % 07/17/21 1919  Vitals shown include unvalidated device data.  Last Pain:  Vitals:   07/17/21 1855  TempSrc: Axillary  PainSc:          Complications: No notable events documented.

## 2021-07-17 NOTE — Progress Notes (Signed)
PHARMACY -  BRIEF ANTIBIOTIC NOTE   Pharmacy has received consult(s) for Vancomycin from an ED provider.  The patient's profile has been reviewed for ht/wt/allergies/indication/available labs.    One time order(s) placed for Vancomycin 1750 mg IV X 1  Further antibiotics/pharmacy consults should be ordered by admitting physician if indicated.                       Thank you, Nawaal Alling D 07/17/2021  12:22 AM

## 2021-07-17 NOTE — ED Provider Notes (Signed)
[facility] Emergency Department Provider Note  ____________________________________________   Event Date/Time   First MD Initiated Contact with Patient 07/16/21 2337     (approximate)  I have reviewed the triage vital signs and the nursing notes.   HISTORY  Chief Complaint Vascular Access Problem  Level 5 caveat:  history/ROS limited by acute/critical illness and the patient being a poor historian.  HPI Jordan [name] is a 55 y.o. male with extensive chronic medical issues as listed below who yesterday signed himself out from the hospital [place].  He presents tonight by EMS for evaluation of bleeding from his left arm dialysis fistula.  The patient is a poor historian and cannot provide almost no details about the recent history except confirming that he signed himself out from the hospital, got dialysis this morning, and then his arm started bleeding after he took a nap.  I reviewed the medical record indicates that during his recent prolonged hospitalization, during which time he had to be intubated due to extreme volume overload, and during which time he had a procedure by vascular surgery to determine that his left arm fistula is nonfunctional; a temporary right femoral access site was established.  He also had blood cultures that tested positive for Enterobacterales (CTX-M ESBL Not Detected) and Serratia marcescens.  A note in the computer from nephrology indicates that the temporary femoral site should be removed if the patient leaves, but apparently this did not happen.  He received cefepime 2 g IV prior to discharge.  He was strongly cautioned against leaving.    He states that when he went to dialysis this morning, the dialysis clinic use the site in his right leg, but then they removed it when he left.  He was told to return in the morning for his usual Wednesday dialysis, but it is unclear how he was going to receive the dialysis  since he has no functional access sites.  He said he has not had any trauma to his arm.  It hurts a little bit but it always hurts.  He denies recent any new numbness or tingling.  He has shortness of breath but states he always has shortness of breath.  He describes the bleeding as severe but after having a pressure dressing placed by EMS he is no longer bleeding.     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) 07/17/2021   AV fistula infection, 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/17/2021   Bacteremia 07/17/2021   AV fistula thrombosis, 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/17/2021   Pressure injury of skin 07/15/2021   Acute on chronic respiratory failure with hypoxemia ([place]) 07/12/2021   Ascites    Protein-calorie malnutrition, severe 06/05/2021   Hypervolemia associated with renal insufficiency 06/02/2021   Symptomatic anemia 05/28/2021   Acute on chronic diastolic CHF (congestive heart failure) ([place]) 05/28/2021   Hyperkalemia 05/28/2021   Pulmonary edema 04/29/2021   Pruritus 04/29/2021   COVID-19 virus infection 04/29/2021   Thrombocytopenia ([place]) 04/08/2021   COPD (  chronic obstructive pulmonary disease) ([place]) 04/08/2021   CHF (congestive heart failure) ([place]) 03/25/2021   PAF (paroxysmal atrial fibrillation) ([place]) 03/20/2021   Chronic diastolic CHF (congestive heart failure) ([place]) 03/20/2021   Alcohol abuse 03/20/2021   Vision loss of left eye 03/20/2021   SOB (shortness of breath) 03/20/2021   Shortness of breath 03/10/2021   Abscess of buttock 03/02/2021   Fluid overload  03/02/2021   Multifocal pneumonia 01/13/2021   Current mild episode of major depressive disorder without prior episode ([place]) 04/09/2020   Atrial fibrillation with RVR ([place]) 04/04/2020   Non-compliance with renal dialysis ([place]) 03/21/2020   Acute CHF (congestive heart failure) ([place]) 03/12/2020   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 ([place]) 02/03/2020   Acute on chronic combined systolic and diastolic CHF (congestive heart failure) ([place]) 02/03/2020   Acute on chronic respiratory failure with hypoxia ([place]) 11/12/2019   Aortic atherosclerosis ([place]) 11/12/2019   Emphysema of lung ([place]) 11/12/2019   Volume overload 10/05/2019   Elevated troponin 05/17/2018   Anemia in ESRD (end-stage renal disease) ([place]) 05/17/2018   ESRD on dialysis ([place]) 05/26/2017   Essential hypertension 05/26/2017   Moderate protein-calorie malnutrition ([place]) 08/13/2016   Secondary hyperparathyroidism of renal origin ([place]) 08/13/2016   Tobacco abuse 08/04/2016    Past Surgical History:  Procedure Laterality Date   A/V FISTULAGRAM Left 07/15/2021   Procedure: A/V FISTULAGRAM;  Surgeon: Algernon Huxley, MD;  Location: [facility];  Service: Cardiovascular;  Laterality: Left;   BASCILIC VEIN TRANSPOSITION Left 08/07/2016   Procedure: LEFT BASILIC VEIN TRANSPOSITION;  Surgeon: Angelia Mould, MD;  Location: [facility];  Service: Vascular;  Laterality: Left;   CLOSED REDUCTION NASAL FRACTURE N/A 08/11/2019   Procedure: CLOSED REDUCTION NASAL FRACTURE WITH STABILIZATION;  Surgeon: Irene Limbo, MD;  Location: [facility];  Service: Plastics;  Laterality: N/A;   COLONOSCOPY N/A 08/12/2016   Procedure: COLONOSCOPY;  Surgeon: Otis Brace, MD;  Location: [facility];  Service: Gastroenterology;  Laterality: N/A;   COLONOSCOPY WITH PROPOFOL N/A 06/05/2021   Procedure: COLONOSCOPY WITH PROPOFOL;  Surgeon: Sharyn Creamer, MD;  Location: [facility];  Service: Gastroenterology;  Laterality: N/A;    ESOPHAGOGASTRODUODENOSCOPY N/A 08/12/2016   Procedure: ESOPHAGOGASTRODUODENOSCOPY (EGD);  Surgeon: Otis Brace, MD;  Location: [facility];  Service: Gastroenterology;  Laterality: N/A;   ESOPHAGOGASTRODUODENOSCOPY (EGD) WITH PROPOFOL N/A 06/05/2021   Procedure: ESOPHAGOGASTRODUODENOSCOPY (EGD) WITH PROPOFOL;  Surgeon: Sharyn Creamer, MD;  Location: [facility];  Service: Gastroenterology;  Laterality: N/A;   INSERTION OF DIALYSIS CATHETER N/A 08/07/2016   Procedure: INSERTION OF TUNNELED DIALYSIS CATHETER;  Surgeon: Angelia Mould, MD;  Location: [facility];  Service: Vascular;  Laterality: N/A;   LIGATION OF ARTERIOVENOUS  FISTULA Left 09/12/2016   Procedure: BANDING OF LEFT  ARTERIOVENOUS  FISTULA;  Surgeon: Angelia Mould, MD;  Location: [facility];  Service: Vascular;  Laterality: Left;   LOWER EXTREMITY INTERVENTION Right 12/02/2018   Procedure: LOWER EXTREMITY INTERVENTION;  Surgeon: Algernon Huxley, MD;  Location: [facility];  Service: Cardiovascular;  Laterality: Right;   POLYPECTOMY  06/05/2021   Procedure: POLYPECTOMY;  Surgeon: Sharyn Creamer, MD;  [facility];  Service: Gastroenterology;;    Prior to Admission medications   Medication Sig Start Date End Date Taking? Authorizing Provider  albuterol (VENTOLIN HFA) 108 (90 Base) MCG/ACT inhaler Inhale 2 puffs into the lungs every 4 (four) hours as needed for wheezing or shortness of breath. 06/05/21  Yes Danford, Suann Larry,  MD  calcium acetate (PHOSLO) 667 MG capsule Take 2 capsules (1,334 mg total) by mouth 3 (three) times daily with meals. 06/05/21  Yes Danford, Suann Larry, MD  cinacalcet (SENSIPAR) 30 MG tablet Take 1 tablet (30 mg total) by mouth daily with breakfast. 06/05/21  Yes Danford, Suann Larry, MD  docusate sodium (COLACE) 100 MG capsule Take 1 capsule (100 mg total) by mouth 2 (two) times daily. 06/05/21  Yes Danford, Suann Larry, MD  epoetin alfa (EPOGEN) 4000 UNIT/ML injection Inject 1 mL  (4,000 Units total) into the vein every Monday, Wednesday, and Friday with hemodialysis. 06/22/20  Yes Enzo Bi, MD  ferrous sulfate 325 (65 FE) MG tablet Take 1 tablet (325 mg total) by mouth 2 (two) times daily with a meal. 06/05/21  Yes Danford, Suann Larry, MD  metoprolol tartrate (LOPRESSOR) 50 MG tablet Take 1 tablet (50 mg total) by mouth 2 (two) times daily. 07/04/21  Yes Jennye Boroughs, MD  pantoprazole (PROTONIX) 20 MG tablet Take 1 tablet (20 mg total) by mouth 2 (two) times daily before a meal. 06/05/21 08/04/21 Yes Danford, Suann Larry, MD    Allergies Lisinopril  Family History  Problem Relation Age of Onset   Diabetes Mother    Kidney failure Mother    Healthy Father    Kidney failure Brother    Healthy Sister    Kidney disease Daughter    Post-traumatic stress disorder Neg Hx    Bladder Cancer Neg Hx    Kidney cancer Neg Hx     Social History Social History   Tobacco Use   Smoking status: Every Day    Packs/day: 0.50    Years: 40.00    Pack years: 20.00    Types: Cigarettes   Smokeless tobacco: Never  Vaping Use   Vaping Use: Never used  Substance Use Topics   Alcohol use: Not Currently    Alcohol/week: 21.0 standard drinks    Types: 21 Cans of beer per week    Comment: last drink 6 months ago   Drug use: No    Review of Systems Level 5 caveat:  history/ROS limited by acute/critical illness and the patient being a poor historian  ____________________________________________   PHYSICAL EXAM:  VITAL SIGNS: ED Triage Vitals [07/16/21 2352]  Enc Vitals Group     BP 117/80     Pulse Rate 91     Resp 15     Temp 98.6 F (37 C)     Temp Source Oral     SpO2 100 %     Weight      Height      Head Circumference      Peak Flow      Pain Score      Pain Loc      Pain Edu?      Excl. in [place]?     Constitutional: Alert and oriented.  Appears chronically ill but is not currently in distress. Eyes: Conjunctivae are normal.  Head:  Atraumatic. Nose: No congestion/rhinnorhea. Mouth/Throat: Patient is wearing a mask. Neck: No stridor.  No meningeal signs.   Cardiovascular: Normal rate, regular rhythm.  Patient has a large indurated area on his left antecubital fossa consistent with a history of dialysis fistula.  However I suspect there is also a hematoma and/or abscess.  There is an open wound on the superior aspect of the raised area that is weeping blood and what appears to be purulent material.  There is also drainage of  bloody pus towards the bottom of the wound and I can express additional material from it.  There is no palpable thrill.  Left hand is warm and capillary refill is normal. Respiratory: Increased respiratory effort but this may be baseline.  Coarse breath sounds throughout.  Frequent cough. Gastrointestinal: Soft and nontender. No distention.  Musculoskeletal: No lower extremity tenderness nor edema. No gross deformities of extremities. Neurologic:  Normal speech and language. No gross focal neurologic deficits are appreciated.  Skin:  Skin is warm, dry and intact except as described above. Psychiatric: Mood and affect are generally calm and cooperative, but patient has very limited insight into his disease processes.  ____________________________________________   LABS (all labs ordered are listed, but only abnormal results are displayed)  Labs Reviewed  COMPREHENSIVE METABOLIC PANEL - Abnormal; Notable for the following components:      Result Value   Chloride 97 (*)    Glucose, Bld 101 (*)    BUN 57 (*)    Creatinine, Ser 7.40 (*)    Calcium 8.3 (*)    Albumin 2.6 (*)    GFR, Estimated 8 (*)    All other components within normal limits  CBC WITH DIFFERENTIAL/PLATELET - Abnormal; Notable for the following components:   RBC 2.13 (*)    Hemoglobin 6.5 (*)    HCT 20.2 (*)    RDW 16.2 (*)    Platelets 130 (*)    Lymphs Abs 0.4 (*)    Abs Immature Granulocytes 0.12 (*)    All other components  within normal limits  PROTIME-INR - Abnormal; Notable for the following components:   Prothrombin Time 17.1 (*)    INR 1.4 (*)    All other components within normal limits  APTT - Abnormal; Notable for the following components:   aPTT 39 (*)    All other components within normal limits  PHOSPHORUS - Abnormal; Notable for the following components:   Phosphorus 5.2 (*)    All other components within normal limits  RESP PANEL BY RT-PCR (FLU A&B, COVID) ARPGX2  AEROBIC/ANAEROBIC CULTURE W GRAM STAIN (SURGICAL/DEEP WOUND)  CULTURE, BLOOD (ROUTINE X 2)  CULTURE, BLOOD (ROUTINE X 2)  LACTIC ACID, PLASMA  MAGNESIUM  LACTIC ACID, PLASMA  HEMOGLOBIN  TYPE AND SCREEN   ____________________________________________   RADIOLOGY I, Hinda Kehr, personally viewed and evaluated these images (plain radiographs) as part of my medical decision making, as well as reviewing the written report by the radiologist.  ED MD interpretation: Possible interstitial edema, chronic changes.  Official radiology report(s): DG Chest Port 1 View  Result Date: 07/17/2021 CLINICAL DATA:  Bleeding of left arm dialysis fistula. EXAM: PORTABLE CHEST 1 VIEW COMPARISON:  July 15, 2021 FINDINGS: Mild, stable diffusely increased interstitial lung markings are seen. There is no evidence of a pleural effusion or pneumothorax. The cardiac silhouette is markedly enlarged and unchanged in size. Marked severity calcification of the thoracic aorta is noted. The visualized skeletal structures are unremarkable. IMPRESSION: 1. Stable cardiomegaly. 2. Mild, stable, diffusely increased interstitial lung markings, which are likely, in part, chronic in nature. A mild superimposed component of interstitial edema cannot be excluded. Electronically Signed   By: Virgina Norfolk M.D.   On: 07/17/2021 00:42    ____________________________________________   PROCEDURES   Procedure(s) performed (including Critical Care):  .1-3 Lead EKG  Interpretation Performed by: Hinda Kehr, MD Authorized by: Hinda Kehr, MD     Interpretation: normal     ECG rate:  92   ECG  rate assessment: normal     Rhythm: sinus rhythm     Ectopy: none     Conduction: normal     ____________________________________________   INITIAL IMPRESSION / MDM / ASSESSMENT AND PLAN / ED COURSE  As part of my medical decision making, I reviewed the following data within the [place] notes reviewed and incorporated, Labs reviewed , Old chart reviewed, Radiograph reviewed , Discussed with admitting physician , and Notes from prior ED visits   Differential diagnosis includes, but is not limited to, Bacteremia, wound infection, bleeding from AV fistula, sepsis, CHF exacerbation, Electrolyte abnormality.  The patient is on the cardiac monitor to evaluate for evidence of arrhythmia and/or significant heart rate changes.       Clinical Course as of 07/17/21 9767  Wed Jul 17, 2021  0208 [facility] Chest [place] 1 View I personally reviewed the patient's imaging and agree with the radiologist's interpretation that chest x-ray is stable without any obvious acute abnormality, though there may be an element of developing interstitial edema. [CF]  0208 Comprehensive metabolic panel(!) Stable comprehensive metabolic panel.  Slightly elevated phosphorus.  Normal magnesium. [CF]  0209 Blood cultures pending.  CBC is normal other than anemia, and his hemoglobin has dropped 2 points from 2 days ago.  I will check a type and screen but will hold off on transfusion as this can be done during dialysis which he may need tomorrow. [CF]  0229 The patient's vital signs are stable.  He does not meet sepsis criteria.  However, he has known bacteremia and left AGAINST MEDICAL ADVICE just over 24 hours ago.  He now has no dialysis access.  He also has having acute bleeding from at least one if not 2 wounds on his left arm fistula which now appears to also be  acutely infected with purulent material draining from the wound.  I am covering with cefepime 2 g IV and vancomycin per pharmacy consult.  Labs pending including additional blood cultures.  I personally obtained a wound culture specimen from the draining purulence from his left arm wound.  He will need admission and consults by vascular surgery and nephrology but he does not need emergent dialysis at this time.  I stressed to the patient the importance of staying in the hospital this time.  I have verified that he is DNR.  He was intubated last time as a stopgap measure until he can get dialysis to protect his airway but he does have advanced care directives. [CF]  [phone] Discussed case by phone with Dr. Damita Dunnings with the hospitalist service.  She will admit.  I also sent an in basket message to Dr. Juleen China with nephrology so that he would find a message first thing in the morning.  I also discussed with Dr. Damita Dunnings my recommendation for consulting vascular surgery in the morning. [CF]    Clinical Course User Index [CF] Hinda Kehr, MD     ____________________________________________  FINAL CLINICAL IMPRESSION(S) / ED DIAGNOSES  Final diagnoses:  Wound infection  Bacteremia  ESRD on hemodialysis ([place])  Acute on chronic anemia     MEDICATIONS GIVEN DURING THIS VISIT:  Medications  hydrOXYzine (ATARAX/VISTARIL) tablet 25 mg (25 mg Oral Given 07/17/21 0503)  acetaminophen (TYLENOL) tablet 650 mg (has no administration in time range)    Or  acetaminophen (TYLENOL) suppository 650 mg (has no administration in time range)  ondansetron (ZOFRAN) tablet 4 mg (has no administration in time range)    Or  ondansetron (ZOFRAN) injection 4 mg (has no administration in time range)  ceFEPIme (MAXIPIME) 2 g in sodium chloride 0.9 % 100 mL IVPB (0 g Intravenous Stopped 07/17/21 0200)  vancomycin (VANCOREADY) IVPB 1750 mg/350 mL (0 mg Intravenous Stopped 07/17/21 0400)  cetirizine (ZYRTEC) tablet 5 mg  (5 mg Oral Given 07/17/21 0330)     ED Discharge Orders     None        Note:  This document was prepared using Dragon voice recognition software and may include unintentional dictation errors.   Hinda Kehr, MD 07/17/21 [phone]

## 2021-07-17 NOTE — ED Notes (Signed)
Pt O2 tank replaced  

## 2021-07-17 NOTE — ED Notes (Signed)
O2 tank replaced

## 2021-07-17 NOTE — Op Note (Signed)
OPERATIVE NOTE   PROCEDURE: Removal of infected left brachial basilic AV fistula. Insertion of trialysis catheter left femoral vein with ultrasound guidance   PRE-OPERATIVE DIAGNOSIS: Infected left brachiobasilic AV fistula; end-stage renal disease on hemodialysis  POST-OPERATIVE DIAGNOSIS: Same  SURGEON: Katha Cabal, M.D. ASSISTANT(S): None  ANESTHESIA: general  ESTIMATED BLOOD LOSS: 100 cc  FINDING(S): 1.  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  A deep wound tissue culture was sent for microbiology.  A swab culture of the purulent material encountered within the center of the aneurysmal segment also sent for microbiology  INDICATIONS:   Jordan [name] is a 55 y.o. male who presents with an infected AV access.  Given the aneurysmal degeneration in association with the necrotic material this will require excision. The risks and benefits of been reviewed all questions are answered patient has agreed to proceed.  DESCRIPTION: After obtaining full informed written consent, the patient was brought back to the operating room and placed supine upon the operating table.  The patient received IV antibiotics prior to induction.  After obtaining adequate anesthesia, the patient's left groin was prepped and draped in the standard fashion appropriate time out is called.  Ultrasound is then delivered onto the field and used to identify the left common femoral vein.  Left common femoral vein is echolucent and compressible indicating patency.  Under direct ultrasound visualization after an image is recorded a Seldinger needle was inserted the femoral vein J-wire was advanced without difficulty small counterincision was then created dilators passed over the  wire and then the try Alysis catheter is inserted.  All 3 lm aspirated and flushed easily.  Catheter secured to the skin of the thigh with 2-0 nylon sutures Biopatch and sterile dressing is applied.  Attention is then turned to the left arm which is prepped and draped in a sterile fashion.  Appropriate timeout is once again performed.  The previous incisional scar overlying the arterial anastomosis is then re-incised and the dissection is carried down to expose the arterial anastomosis. The native artery is then dissected circumferentially proximally and distally and the suture line is located. In similar fashion the venous confluence in the axilla is exposed.  A profunda femoris clamp was then placed just above the level of the brachial artery.  Elliptical incision is then created surrounding the aneurysmal segments of the graft and the incision is extended along the basilic vein to the confluence with the axillary vein.  Again a profunda clamp was placed across the axillary vein.  The vein is then transected approximately 4 mm from the profunda clamp and in a similar fashion it is transected approximately 4 mm above the profunda femoris clamp at the level of the arterial anastomosis.  There is thrombus noted in the vein and given the infection the clamp is removed and the thrombus is extracted until backbleeding is obtained.  The vein is then reclamped and oversewn with a 5-0 Prolene in a horizontal mattress followed by a continuous running fashion.  The suture line is checked for hemostasis and the arterial end is addressed in a similar fashion with a 5-0 Prolene used to create a horizontal mattress followed by a continuous running suture.  The specimen is passed off the field.  It is opened on  the back table and pus is encountered and this is cultured.  Deep tissue was also debrided and passed off as a second culture specimen.  The remaining en bloc specimen is passed off and placed in formalin.  The  wound is then irrigated hemostasis is achieved with Bovie cautery the 2 suture lines were then oversewn with 3-0 Vicryl to ensure that they are covered.  The wound itself is then closed using interrupted vertical mattress sutures with 3-0 nylon.  Large gaps are left through the midportion which were then packed with iodoform gauze.  4 x 4's and Curlex are then applied.   COMPLICATIONS: None  CONDITION: Jordan [name], M.D. [facility] Office: [phone]   07/17/2021, 6:33 PM

## 2021-07-17 NOTE — Progress Notes (Signed)
Pharmacy Antibiotic Note  Jordan [name] is a 55 y.o. male admitted on 07/16/2021 with  AV fistula infection .  PMH includes ESRD on HD MWF. Pharmacy has been consulted for cefepime dosing.  Patient re-admitted after leaving AMA on 10/25. Upon readmission, given additional cefepime 2 gram dose, which was within 24 hours and prior to next HD session.Cefepime 70-85% dialyzable assuming 3.5-4 hour HD session. Per nephro, next HD treatment scheduled for tomorrow 10/27.    Plan: Give cefepime 1 gram dose after next HD session. Follow up 10/27, to ensure HD scheduled. Will plan to order cefepime dose at that time. Monitor renal function, neuro status, HD plans and LOT.      Temp (24hrs), Avg:98.6 F (37 C), Min:98.3 F (36.8 C), Max:98.9 F (37.2 C)  Recent Labs  Lab 07/12/21 1851 07/13/21 0155 07/13/21 0311 07/13/21 1317 07/15/21 1250 07/15/21 1503 07/15/21 1928 07/17/21 0110  WBC 13.9*  --  13.1* 12.4*  --   --  16.8* 9.1  CREATININE 14.11*   < > 14.43* 14.84* 10.01* 10.25*  --  7.40*  LATICACIDVEN  --   --   --   --   --   --   --  1.5   < > = values in this interval not displayed.    Estimated Creatinine Clearance: 10.7 mL/min (A) (by C-G formula based on SCr of 7.4 mg/dL (H)).    Allergies  Allergen Reactions   Lisinopril Swelling    Antimicrobials this admission: cefepime 10/25 >>  vancomycin 10/26 >> 10/26  Dose adjustments this admission: Cefepime 2 grams qHD > given additional cefepime 2 gram dose > decrease to cefepime 1 gram x 1  Microbiology results: 10/24 BCx: Serratia marcescens 10/26 BCx: NG < 12 HRs  10/26 Left antecubital: rare GNR    Thank you for allowing pharmacy to be a part of this patient's care.    Wynelle Cleveland, PharmD Pharmacy Resident  07/17/2021 3:32 PM

## 2021-07-17 NOTE — Anesthesia Preprocedure Evaluation (Addendum)
Anesthesia Evaluation  Patient identified by MRN, date of birth, ID band Patient awake    Reviewed: Allergy & Precautions, NPO status , Patient's Chart, lab work & pertinent test results  History of Anesthesia Complications Negative for: history of anesthetic complications  Airway Mallampati: IV   Neck ROM: Full    Dental  (+) Edentulous Upper, Edentulous Lower   Pulmonary COPD,  oxygen dependent, Current Smoker (2-3 cigarettes per day) and Patient abstained from smoking.,  COVID+ 04/29/21   Pulmonary exam normal breath sounds clear to auscultation       Cardiovascular hypertension, pulmonary hypertension+CHF  + Valvular Problems/Murmurs MR  Rhythm:Irregular Rate:Normal  ECG 07/12/21:  Atrial fibrillation Ventricular premature complex Right axis deviation Nonspecific T abnrm, anterolateral leads Borderline prolonged QT interval   Neuro/Psych PSYCHIATRIC DISORDERS Depression Alcohol use disorder, last intake 2-3 months ago    GI/Hepatic negative GI ROS,   Endo/Other  negative endocrine ROS  Renal/GU ESRF and DialysisRenal disease (last HD 07/15/21)     Musculoskeletal   Abdominal   Peds  Hematology  (+) Blood dyscrasia, anemia ,   Anesthesia Other Findings   Reproductive/Obstetrics                            Anesthesia Physical Anesthesia Plan  ASA: 4 and emergent  Anesthesia Plan: General   Post-op Pain Management:    Induction: Intravenous  PONV Risk Score and Plan: 1 and Ondansetron, Dexamethasone and Treatment may vary due to age or medical condition  Airway Management Planned: Oral ETT  Additional Equipment:   Intra-op Plan:   Post-operative Plan: Extubation in OR  Informed Consent: I have reviewed the patients History and Physical, chart, labs and discussed the procedure including the risks, benefits and alternatives for the proposed anesthesia with the patient or  authorized representative who has indicated his/her understanding and acceptance.   Patient has DNR.  Discussed DNR with patient and Suspend DNR.     Plan Discussed with: CRNA  Anesthesia Plan Comments:       Anesthesia Quick Evaluation

## 2021-07-17 NOTE — Consult Note (Signed)
NAME:  Jordan [name], MRN:  [id number], DOB:  1966-03-26, LOS: 0 ADMISSION DATE:  07/16/2021, CONSULTATION DATE:  07/17/2021 REFERRING MD:  Darrin Nipper, MD CHIEF COMPLAINT:  Respiratory Failure   History of Present Illness:  Jordan [name] is a 55 year old male, daily smoker with history of end-stage renal disease on intermittent hemodialysis, hypertension alcohol abuse, and COPD who was admitted 10/25 due to bleeding from his AV fistula.  He received dialysis on 10/25 prior to admission. He was previously admitted 10/21 for volume overload with pulmonary edema requiring mechanical intubation due to missed dialysis session. He underwent fistulogram on 10/24 and found to have thrombosed AV fistula. He was started on IV heparin  and signed out AMA later on 10/24. Patient's blood cultures on from 10/24 shoed enterobacterales and serratia. ID is following and patient has been treated with cefepime. He was taken to the OR 10/26 by Vascular Surgery for removal of infected left brachial basilic AV fistula and insertion of trialysis catheter of the left femoral vein. He was not successfully extubated after the procedure and was re-intubated and transferred to the ICU.   PCCM has been consulted for further management.  Pertinent  Medical History   Past Medical History:  Diagnosis Date   Anemia    Aortic atherosclerosis ([place]) 11/12/2019   ED (erectile dysfunction)    Emphysema of lung ([place]) 11/12/2019   ESRD on hemodialysis ([place])    M/W/F in [place], [place]   ETOH abuse    History of blood transfusion    Hypertension    Wears dentures    Significant Hospital Events: Including procedures, antibiotic start and stop dates in addition to other pertinent events   10/25 admitted 10/26 s/p removal of infected left brachial basilic AV fistula and insertion of trialysis catheter of the left femoral vein. Admitted to ICU post-operatively.  Interim History / Subjective:   Patient is unresponsive and  intubated.   ABG pH 7.26, pCO2 49, pO2 63 on Vt 442mL, PEEP 8, RR 16, Fio2 60%  Objective   Blood pressure 103/73, pulse 86, temperature 97.6 F (36.4 C), temperature source Axillary, resp. rate 16, height 5\' 4"  (1.626 m), weight 74.7 kg, SpO2 93 %.    Vent Mode: PRVC FiO2 (%):  [60 %] 60 % Set Rate:  [16 bmp] 16 bmp Vt Set:  [450 mL] 450 mL PEEP:  [8 cmH20] 8 cmH20   Intake/Output Summary (Last 24 hours) at 07/17/2021 1911 Last data filed at 07/17/2021 1801 Gross per 24 hour  Intake 670 ml  Output 100 ml  Net 570 ml   Filed Weights   07/17/21 1539 07/17/21 1855  Weight: 74.9 kg 74.7 kg    Examination: General: sedated, no acute distress, chronically ill appearing male HENT: [place]/AT, PERRL, sclera anicteric, moist mucous membranes Lungs: clear to auscultation, no wheezing or rhonchi Cardiovascular: rrr, no murmurs Abdomen: soft, non-distended, bowel sounds present Extremities: warm, no edema Neuro: sedated GU: no foley  Resolved Hospital Problem list     Assessment & Plan:  Acute Hypoxemic and Hypercapnic Respiratory Failure in post-op setting - continue mechanical ventilatory support - Will increase PEEP to 10 and RR to 20 based on ABG - Check Chest radiograph - PAD pathway ordered, RASS goal of -2, fentanyl and precedex ordered - May require dialysis prior to extubation  Septic Shock Bacteremia due to Serratia marcescens and enterobacterales - Continue cefepime per ID recommendations - Repeat blood cultures today pending - Follow up culture of  AV fistula thrombectomy - stop neosynephrine and start levophed.  AV Fistula Thrombectomy S/p removal of fistula by vascular surgery 10/26 - further recs/management per vascular surgery  Encephalopathy In setting of shock and respiratory failure - check ammonia level due to EtOH abuse - limit sedatives  Acute Blood Loss Anemia Pseduoaneurysm of left brachiocephalic AV Fistula - s/p 2 units PRBCs 10/26 with  post-transfusion hemoglobin 7.3g/dL - monitor H/H - transfuse for hemoglobin less than 7g/dL  ESRD on HD - nephrology following for dialysis needs - May require dialysis prior to extubation  Chronic Diastolic Heart Failure - will monitor for volume overload  COPD - not acutely exacerbated  Best Practice (right click and "Reselect all SmartList Selections" daily)   Diet/type: NPO DVT prophylaxis: prophylactic heparin  GI prophylaxis: PPI Lines: Dialysis Catheter Foley:  Yes, and it is still needed Code Status:  full code Last date of multidisciplinary goals of care discussion [n/a]  Labs   CBC: Recent Labs  Lab 07/12/21 1851 07/13/21 0311 07/13/21 1317 07/15/21 1503 07/15/21 1928 07/17/21 0110 07/17/21 1232  WBC 13.9* 13.1* 12.4*  --  16.8* 9.1  --   NEUTROABS 12.3*  --   --   --   --  7.5  --   HGB 8.2* 7.2* 6.8* 7.0* 8.4* 6.5* 7.3*  HCT 25.7* 22.5* 22.6*  --  26.8* 20.2* 22.5*  MCV 98.1 97.8 98.7  --  100.8* 94.8  --   PLT 141* 123* 134*  --  133* 130*  --     Basic Metabolic Panel: Recent Labs  Lab 07/12/21 1851 07/13/21 0155 07/13/21 0311 07/13/21 1317 07/15/21 1250 07/15/21 1503 07/17/21 0110  NA 142  --  143 144 139 140 135  K 4.9  --  4.9 5.6* 4.3 4.1 3.5  CL 102  --  103 101 100 101 97*  CO2 20*  --  21* 23 18* 20* 26  GLUCOSE 149*  --  126* 105* 61* 71 101*  BUN 110*  --  108* 133* 85* 88* 57*  CREATININE 14.11*   < > 14.43* 14.84* 10.01* 10.25* 7.40*  CALCIUM 9.1  --  9.1 8.9 8.6* 8.6* 8.3*  MG 2.6*  --   --   --   --   --  1.8  PHOS  --   --   --  9.6*  --  6.6* 5.2*   < > = values in this interval not displayed.   GFR: Estimated Creatinine Clearance: 10.7 mL/min (A) (by C-G formula based on SCr of 7.4 mg/dL (H)). Recent Labs  Lab 07/13/21 0311 07/13/21 1317 07/15/21 1928 07/17/21 0110 07/17/21 1232  WBC 13.1* 12.4* 16.8* 9.1  --   LATICACIDVEN  --   --   --  1.5 1.2    Liver Function Tests: Recent Labs  Lab 07/12/21 1851  07/13/21 0311 07/13/21 1317 07/15/21 1503 07/17/21 0110  AST 16 14*  --   --  16  ALT 16 14  --   --  12  ALKPHOS 108 106  --   --  117  BILITOT 1.5* 1.1  --   --  0.9  PROT 8.0 6.6  --   --  6.6  ALBUMIN 3.2* 2.8* 2.7* 2.7* 2.6*   Recent Labs  Lab 07/12/21 1851  LIPASE 34   No results for input(s): AMMONIA in the last 168 hours.  ABG    Component Value Date/Time   PHART 7.35 07/02/2021 2330   PCO2ART  48 07/02/2021 2330   PO2ART <31.0 (LL) 07/02/2021 2330   HCO3 26.5 07/02/2021 2330   TCO2 19 (L) 06/02/2021 1152   ACIDBASEDEF 1.5 06/28/2021 0437   O2SAT 33.8 07/02/2021 2330     Coagulation Profile: Recent Labs  Lab 07/12/21 1851 07/17/21 0110  INR 1.4* 1.4*    Cardiac Enzymes: No results for input(s): CKTOTAL, CKMB, CKMBINDEX, TROPONINI in the last 168 hours.  HbA1C: Hgb A1c MFr Bld  Date/Time Value Ref Range Status  04/07/2020 11:59 AM 5.0 4.8 - 5.6 % Final    Comment:    (NOTE) Pre diabetes:          5.7%-6.4%  Diabetes:              >6.4%  Glycemic control for   <7.0% adults with diabetes   06/23/2019 08:40 AM 6.3 4.6 - 6.5 % Final    Comment:    Glycemic Control Guidelines for People with Diabetes:Non Diabetic:  <6%Goal of Therapy: <7%Additional Action Suggested:  >8%     CBG: Recent Labs  Lab 07/13/21 0144  GLUCAP 91    Review of Systems:   Unable to perform review of systems due to mental status.  Past Medical History:  He,  has a past medical history of Anemia, Aortic atherosclerosis ([place]) (11/12/2019), ED (erectile dysfunction), Emphysema of lung ([place]) (11/12/2019), ESRD on hemodialysis ([place]), ETOH abuse, History of blood transfusion, Hypertension, and Wears dentures.   Surgical History:   Past Surgical History:  Procedure Laterality Date   A/V FISTULAGRAM Left 07/15/2021   Procedure: A/V FISTULAGRAM;  Surgeon: Algernon Huxley, MD;  Location: [facility];  Service: Cardiovascular;  Laterality: Left;   BASCILIC VEIN  TRANSPOSITION Left 08/07/2016   Procedure: LEFT BASILIC VEIN TRANSPOSITION;  Surgeon: Angelia Mould, MD;  Location: [facility];  Service: Vascular;  Laterality: Left;   CLOSED REDUCTION NASAL FRACTURE N/A 08/11/2019   Procedure: CLOSED REDUCTION NASAL FRACTURE WITH STABILIZATION;  Surgeon: Irene Limbo, MD;  Location: [facility];  Service: Plastics;  Laterality: N/A;   COLONOSCOPY N/A 08/12/2016   Procedure: COLONOSCOPY;  Surgeon: Otis Brace, MD;  Location: [facility];  Service: Gastroenterology;  Laterality: N/A;   COLONOSCOPY WITH PROPOFOL N/A 06/05/2021   Procedure: COLONOSCOPY WITH PROPOFOL;  Surgeon: Sharyn Creamer, MD;  Location: [facility];  Service: Gastroenterology;  Laterality: N/A;   ESOPHAGOGASTRODUODENOSCOPY N/A 08/12/2016   Procedure: ESOPHAGOGASTRODUODENOSCOPY (EGD);  Surgeon: Otis Brace, MD;  Location: [facility];  Service: Gastroenterology;  Laterality: N/A;   ESOPHAGOGASTRODUODENOSCOPY (EGD) WITH PROPOFOL N/A 06/05/2021   Procedure: ESOPHAGOGASTRODUODENOSCOPY (EGD) WITH PROPOFOL;  Surgeon: Sharyn Creamer, MD;  Location: [facility];  Service: Gastroenterology;  Laterality: N/A;   INSERTION OF DIALYSIS CATHETER N/A 08/07/2016   Procedure: INSERTION OF TUNNELED DIALYSIS CATHETER;  Surgeon: Angelia Mould, MD;  Location: [facility];  Service: Vascular;  Laterality: N/A;   LIGATION OF ARTERIOVENOUS  FISTULA Left 09/12/2016   Procedure: BANDING OF LEFT  ARTERIOVENOUS  FISTULA;  Surgeon: Angelia Mould, MD;  Location: [facility];  Service: Vascular;  Laterality: Left;   LOWER EXTREMITY INTERVENTION Right 12/02/2018   Procedure: LOWER EXTREMITY INTERVENTION;  Surgeon: Algernon Huxley, MD;  Location: [facility];  Service: Cardiovascular;  Laterality: Right;   POLYPECTOMY  06/05/2021   Procedure: POLYPECTOMY;  Surgeon: Sharyn Creamer, MD;  Location: [facility];  Service: Gastroenterology;;     Social History:   reports that he has been smoking  cigarettes. He has a 20.00  pack-year smoking history. He has never used smokeless tobacco. He reports that he does not currently use alcohol after a past usage of about 21.0 standard drinks per week. He reports that he does not use drugs.   Family History:  His family history includes Diabetes in his mother; Healthy in his father and sister; Kidney disease in his daughter; Kidney failure in his brother and mother. There is no history of Post-traumatic stress disorder, Bladder Cancer, or Kidney cancer.   Allergies Allergies  Allergen Reactions   Lisinopril Swelling     Home Medications  Prior to Admission medications   Medication Sig Start Date End Date Taking? Authorizing Provider  albuterol (VENTOLIN HFA) 108 (90 Base) MCG/ACT inhaler Inhale 2 puffs into the lungs every 4 (four) hours as needed for wheezing or shortness of breath. 06/05/21  Yes Danford, Suann Larry, MD  calcium acetate (PHOSLO) 667 MG capsule Take 2 capsules (1,334 mg total) by mouth 3 (three) times daily with meals. 06/05/21  Yes Danford, Suann Larry, MD  cinacalcet (SENSIPAR) 30 MG tablet Take 1 tablet (30 mg total) by mouth daily with breakfast. 06/05/21  Yes Danford, Suann Larry, MD  docusate sodium (COLACE) 100 MG capsule Take 1 capsule (100 mg total) by mouth 2 (two) times daily. 06/05/21  Yes Danford, Suann Larry, MD  epoetin alfa (EPOGEN) 4000 UNIT/ML injection Inject 1 mL (4,000 Units total) into the vein every Monday, Wednesday, and Friday with hemodialysis. 06/22/20  Yes Enzo Bi, MD  ferrous sulfate 325 (65 FE) MG tablet Take 1 tablet (325 mg total) by mouth 2 (two) times daily with a meal. 06/05/21  Yes Danford, Suann Larry, MD  metoprolol tartrate (LOPRESSOR) 50 MG tablet Take 1 tablet (50 mg total) by mouth 2 (two) times daily. 07/04/21  Yes Jennye Boroughs, MD  pantoprazole (PROTONIX) 20 MG tablet Take 1 tablet (20 mg total) by mouth 2 (two) times daily before a meal. 06/05/21 08/04/21 Yes Danford,  Suann Larry, MD     Critical care time: 45 minutes    Freda Jackson, MD [place] Office: [phone]   See Amion for personal pager PCCM on call pager [phone] until 7pm. Please call El[facility] 7p-7a. [phone]

## 2021-07-17 NOTE — Consult Note (Signed)
@LOGO @   MRN : [id number]  Jordan [name] is a 55 y.o. ([birth year]) male who presents with chief complaint of infected left arm av access.  History of Present Illness:   I am asked to evaluate the patient by Dr. Damita Dunnings.  The patient is a 55 year old gentleman with a long history of end-stage renal disease on hemodialysis.  He was recently admitted to [facility] on July 12, 2021.  His admission was secondary to sepsis and associated problems with his access.  Approximately 48 hours ago blood cultures grew [place]err[place]ia.  Approximately 24 hours ago the patient signed out AMA.  He returned to the ER last night with increasing septic complications.  Prior to signing out AMA his temporary dialysis catheter was removed.  Current Meds  Medication Sig   albuterol (VENTOLIN HFA) 108 (90 Base) MCG/ACT inhaler Inhale 2 puffs into the lungs every 4 (four) hours as needed for wheezing or shortness of breath.   calcium acetate (PHOSLO) 667 MG capsule Take 2 capsules (1,334 mg total) by mouth 3 (three) times daily with meals.   cinacalcet (SENSIPAR) 30 MG tablet Take 1 tablet (30 mg total) by mouth daily with breakfast.   docusate sodium (COLACE) 100 MG capsule Take 1 capsule (100 mg total) by mouth 2 (two) times daily.   epoetin alfa (EPOGEN) 4000 UNIT/ML injection Inject 1 mL (4,000 Units total) into the vein every Monday, Wednesday, and Friday with hemodialysis.   ferrous sulfate 325 (65 FE) MG tablet Take 1 tablet (325 mg total) by mouth 2 (two) times daily with a meal.   metoprolol tartrate (LOPRESSOR) 50 MG tablet Take 1 tablet (50 mg total) by mouth 2 (two) times daily.   pantoprazole (PROTONIX) 20 MG tablet Take 1 tablet (20 mg total) by mouth 2 (two) times daily before a meal.    Past Medical History:  Diagnosis Date   Anemia    Aortic atherosclerosis ([place]) 11/12/2019   ED (erectile dysfunction)    Emphysema of lung ([place]) 11/12/2019   ESRD on hemodialysis ([place])    M/W/F in [place],  [place]   ETOH abuse    History of blood transfusion    Hypertension    Wears dentures     Past Surgical History:  Procedure Laterality Date   A/V FISTULAGRAM Left 07/15/2021   Procedure: A/V FISTULAGRAM;  Surgeon: Algernon Huxley, MD;  Location: [place] [facility];  Service: Cardiovascular;  Laterality: Left;   BASCILIC VEIN TRANSPOSITION Left 08/07/2016   Procedure: LEFT BASILIC VEIN TRANSPOSITION;  Surgeon: Angelia Mould, MD;  Location: [place];  Service: Vascular;  Laterality: Left;   CLOSED REDUCTION NASAL FRACTURE N/A 08/11/2019   Procedure: CLOSED REDUCTION NASAL FRACTURE WITH STABILIZATION;  Surgeon: Irene Limbo, MD;  Location: [place];  Service: Plastics;  Laterality: N/A;   COLONOSCOPY N/A 08/12/2016   Procedure: COLONOSCOPY;  Surgeon: Otis Brace, MD;  Location: [place];  Service: Gastroenterology;  Laterality: N/A;   COLONOSCOPY WITH PROPOFOL N/A 06/05/2021   Procedure: COLONOSCOPY WITH PROPOFOL;  Surgeon: Sharyn Creamer, MD;  Location: [place];  Service: Gastroenterology;  Laterality: N/A;   ESOPHAGOGASTRODUODENOSCOPY N/A 08/12/2016   Procedure: ESOPHAGOGASTRODUODENOSCOPY (EGD);  Surgeon: Otis Brace, MD;  Location: [place] [facility];  Service: Gastroenterology;  Laterality: N/A;   ESOPHAGOGASTRODUODENOSCOPY (EGD) WITH PROPOFOL N/A 06/05/2021   Procedure: ESOPHAGOGASTRODUODENOSCOPY (EGD) WITH PROPOFOL;  Surgeon: Sharyn Creamer, MD;  Location: [facility];  Service: Gastroenterology;  Laterality: N/A;   INSERTION OF DIALYSIS CATHETER N/A 08/07/2016  Procedure: INSERTION OF TUNNELED DIALYSIS CATHETER;  Surgeon: Angelia Mould, MD;  Location: [facility];  Service: Vascular;  Laterality: N/A;   LIGATION OF ARTERIOVENOUS  FISTULA Left 09/12/2016   Procedure: BANDING OF LEFT  ARTERIOVENOUS  FISTULA;  Surgeon: Angelia Mould, MD;  Location: [place];  Service: Vascular;  Laterality: Left;   LOWER EXTREMITY INTERVENTION Right 12/02/2018   Procedure: LOWER  EXTREMITY INTERVENTION;  Surgeon: Algernon Huxley, MD;  Location: [place] [facility];  Service: Cardiovascular;  Laterality: Right;   POLYPECTOMY  06/05/2021   Procedure: POLYPECTOMY;  Surgeon: Sharyn Creamer, MD;  Location: [facility];  Service: Gastroenterology;;    Social History Social History   Tobacco Use   Smoking status: Every Day    Packs/day: 0.50    Years: 40.00    Pack years: 20.00    Types: Cigarettes   Smokeless tobacco: Never  Vaping Use   Vaping Use: Never used  Substance Use Topics   Alcohol use: Not Currently    Alcohol/week: 21.0 standard drinks    Types: 21 Cans of beer per week    Comment: last drink 6 months ago   Drug use: No    Family History Family History  Problem Relation Age of Onset   Diabetes Mother    Kidney failure Mother    Healthy Father    Kidney failure Brother    Healthy Sister    Kidney disease Daughter    Post-traumatic stress disorder Neg Hx    Bladder Cancer Neg Hx    Kidney cancer Neg Hx     Allergies  Allergen Reactions   Lisinopril Swelling     REVIEW OF SYSTEMS (Negative unless checked)  Constitutional: [] Weight loss  [] Fever  [] Chills Cardiac: [] Chest pain   [] Chest pressure   [] Palpitations   [] Shortness of breath when laying flat   [] Shortness of breath with exertion. Vascular:  [] Pain in legs with walking   [] Pain in legs at rest  [] History of DVT   [] Phlebitis   [] Swelling in legs   [] Varicose veins   [] Non-healing ulcers Pulmonary:   [] Uses home oxygen   [] Productive cough   [] Hemoptysis   [] Wheeze  [] COPD   [] Asthma Neurologic:  [] Dizziness   [] Seizures   [] History of stroke   [] History of TIA  [] Aphasia   [] Vissual changes   [] Weakness or numbness in arm   [] Weakness or numbness in leg Musculoskeletal:   [] Joint swelling   [] Joint pain   [] Low back pain Hematologic:  [] Easy bruising  [] Easy bleeding   [] Hypercoagulable state   [] Anemic Gastrointestinal:  [] Diarrhea   [] Vomiting  [] Gastroesophageal  reflux/heartburn   [] Difficulty swallowing. Genitourinary:  [x] Chronic kidney disease   [] Difficult urination  [] Frequent urination   [] Blood in urine Skin:  [] Rashes   [] Ulcers  Psychological:  [] History of anxiety   []  History of major depression.  Physical Examination  Vitals:   07/16/21 2352 07/17/21 0200 07/17/21 0237 07/17/21 0505  BP: 117/80 105/77 110/68 123/79  Pulse: 91 88 89 81  Resp: 15 18 17 16   Temp: 98.6 F (37 C)     TempSrc: Oral     SpO2: 100% 99% 98% 99%   There is no height or weight on file to calculate BMI. Gen: WD/WN, NAD Head: [place]/AT, No temporalis wasting.  Ear/Nose/Throat: Hearing grossly intact, nares w/o erythema or drainage Eyes: PER, EOMI, sclera nonicteric.  Neck: Supple, no gross masses or lesions.  No JVD.  Pulmonary:  Good  air movement, no audible wheezing, no use of accessory muscles.  Cardiac: RRR, precordium non-hyperdynamic. Vascular:   Left arm brachiocephalic fistula markedly aneurysmal no thrill no bruit mildly tender to the touch Vessel Right Left  Radial Palpable Palpable  Brachial Palpable Palpable  Gastrointestinal: soft, non-distended. No guarding/no peritoneal signs.  Musculoskeletal: M/S 5/5 throughout.  No deformity.  Neurologic: CN 2-12 intact. Pain and light touch intact in extremities.  Symmetrical.  Speech is fluent. Motor exam as listed above. Psychiatric: Judgment intact, Mood & affect appropriate for pt's clinical situation. Dermatologic: Numerous ulcers noted consistent with calciphylaxis.  Changes consistent with cellulitis left arm.   CBC Lab Results  Component Value Date   WBC 9.1 07/17/2021   HGB 6.5 (L) 07/17/2021   HCT 20.2 (L) 07/17/2021   MCV 94.8 07/17/2021   PLT 130 (L) 07/17/2021    BMET    Component Value Date/Time   NA 135 07/17/2021 0110   K 3.5 07/17/2021 0110   CL 97 (L) 07/17/2021 0110   CO2 26 07/17/2021 0110   GLUCOSE 101 (H) 07/17/2021 0110   BUN 57 (H) 07/17/2021 0110   CREATININE 7.40  (H) 07/17/2021 0110   CALCIUM 8.3 (L) 07/17/2021 0110   GFRNONAA 8 (L) 07/17/2021 0110   GFRAA 4 (L) 06/20/2020 0414   Estimated Creatinine Clearance: 10.7 mL/min (A) (by C-G formula based on SCr of 7.4 mg/dL (H)).  COAG Lab Results  Component Value Date   INR 1.4 (H) 07/17/2021   INR 1.4 (H) 07/12/2021   INR 1.4 (H) 03/21/2020    Radiology DG Chest 1 View  Result Date: 06/28/2021 CLINICAL DATA:  55 year old male intubated.  Neck swelling. EXAM: CHEST  1 VIEW COMPARISON:  Portable chest 06/27/2021. FINDINGS: Portable AP supine view at 0412 hours. Calcified aortic atherosclerosis. Cardiomegaly. Other mediastinal contours are within normal limits. Endotracheal tube tip projects just above the clavicles over the trachea. A new right EJ region intravenous access is in place. No lower neck subcutaneous emphysema or other soft tissue abnormality is identified. Stable lung volumes. No pneumothorax. No definite pleural effusion or consolidation. Widespread increased pulmonary interstitium has mildly regressed since 06/26/2021. Paucity of bowel gas in the upper abdomen. No acute osseous abnormality identified. IMPRESSION: 1. ETT tip just above the clavicles, could be advanced 1-2 cm for more optimal placement but otherwise satisfactory. 2. No neck subcutaneous emphysema or explanation for swelling. There is a new superficial right EJ type IV access in place. 3. Cardiomegaly and Aortic Atherosclerosis (ICD10-I70.0). Pulmonary vascular congestion appears regressed since 06/26/2021. 4. No new cardiopulmonary abnormality. Electronically Signed   By: Genevie Ann M.D.   On: 06/28/2021 05:05   DG Chest 1 View  Result Date: 06/27/2021 CLINICAL DATA:  Anaphylactic reaction EXAM: CHEST  1 VIEW COMPARISON:  06/26/2021 FINDINGS: Cardiac size is moderately enlarged, similar to prior examination and similar to remote prior examination of 04/26/2021. A component of this was related to an underlying pericardial effusion  better appreciated on CT examination of 05/03/2021. Superimposed mild to moderate diffuse interstitial pulmonary edema has progressed slightly in the interval since prior examination. No pneumothorax or pleural effusion. Pulmonary insufflation is stable, the lung volumes are small. No acute bone abnormality. IMPRESSION: Stable cardiomegaly, a component which likely relates to an underlying pericardial effusion as better seen on CT examination of 05/03/2021. Progressive mild to moderate pulmonary edema. Pulmonary hypoinflation. Electronically Signed   By: Fidela Salisbury M.D.   On: 06/27/2021 21:29   DG Chest 2 View  Result Date: 07/02/2021 CLINICAL DATA:  Respiratory failure. EXAM: CHEST - 2 VIEW COMPARISON:  06/28/2021 FINDINGS: 0832 hours. Interval extubation. Low volume film. The cardio pericardial silhouette is enlarged. Diffuse interstitial and parahilar alveolar opacity suggests edema. No substantial pleural effusion. Telemetry leads overlie the chest. IMPRESSION: Low volume film with cardiomegaly and pulmonary edema. Electronically Signed   By: Misty Stanley M.D.   On: 07/02/2021 11:35   DG Abd 1 View  Result Date: 07/03/2021 CLINICAL DATA:  Abdominal pain EXAM: ABDOMEN - 1 VIEW COMPARISON:  None. FINDINGS: There is loss of the properitoneal fat flank stripes and central crowding of aerated loops of bowel suggesting underlying ascites. Underpenetration precludes evaluation of the visceral shadows. Nonobstructive bowel gas pattern. Calcifications within the mid abdomen correspond to renovascular calcifications. Advanced vascular calcifications noted within the pelvis. No gross free intraperitoneal gas. IMPRESSION: Suspected at least moderate ascites. Nonobstructive bowel gas pattern. . Electronically Signed   By: Fidela Salisbury M.D.   On: 07/03/2021 22:56   CT SOFT TISSUE NECK WO CONTRAST  Result Date: 06/28/2021 CLINICAL DATA:  Foreign body suspected, neck, neg xray. Left neck swelling. Clinical  concern for angioedema. Renal failure on dialysis. EXAM: CT NECK WITHOUT CONTRAST TECHNIQUE: Multidetector CT imaging of the neck was performed following the standard protocol without intravenous contrast. COMPARISON:  01/14/2021 FINDINGS: Pharynx and larynx: An endotracheal tube is present and terminates at the level of the clavicular heads. There is motion artifact through the larynx, improved with limited repeat imaging. No mass is identified within limitations of noncontrast technique. Mild diffuse edema suspected in the oral cavity and left greater than right oropharynx. There is mild retropharyngeal edema and/or small retropharyngeal effusion. Salivary glands: No salivary stone or gross mass on this unenhanced study. Thyroid: Grossly unremarkable within limitations of motion artifact. Lymph nodes: No grossly enlarged cervical lymph nodes within limitations of noncontrast technique and diffuse edema. Vascular: Widespread atherosclerotic vascular calcification. Limited intracranial: Possible subcentimeter chronic infarct in the right cerebellar hemisphere. Visualized orbits: Unremarkable. Mastoids and visualized paranasal sinuses: Right larger than left mastoid effusions. Visualized paranasal sinuses are clear. Skeleton: Diffusely increased density of the included skeletal structures compatible with renal osteodystrophy. Moderate cervical spondylosis. Upper chest: Limited assessment due to motion. Emphysema and dependent atelectasis bilaterally. Other: Widespread soft tissue edema throughout the neck, overall left greater than right. No organized fluid collection identified on this unenhanced study. IMPRESSION: Widespread edema throughout the soft tissues of the neck, left greater than right. Oral cavity and likely mild oropharyngeal edema with mild retropharyngeal edema or small retropharyngeal effusion. These findings may reflect angioedema and generalized fluid overload although infection/cellulitis is not  excluded. No organized fluid collection identified on this unenhanced study. Electronically Signed   By: Logan Bores M.D.   On: 06/28/2021 08:23   PERIPHERAL VASCULAR CATHETERIZATION  Result Date: 07/15/2021 See surgical note for result.  US Venous Img Lower Bilateral (DVT)  Result Date: 07/05/2021 CLINICAL DATA:  55 year old male with a history bilateral leg pain EXAM: BILATERAL LOWER EXTREMITY VENOUS DOPPLER ULTRASOUND TECHNIQUE: Gray-scale sonography with graded compression, as well as color Doppler and duplex ultrasound were performed to evaluate the lower extremity deep venous systems from the level of the common femoral vein and including the common femoral, femoral, profunda femoral, popliteal and calf veins including the posterior tibial, peroneal and gastrocnemius veins when visible. The superficial great saphenous vein was also interrogated. Spectral Doppler was utilized to evaluate flow at rest and with distal augmentation maneuvers in the common  femoral, femoral and popliteal veins. COMPARISON:  None. FINDINGS: RIGHT LOWER EXTREMITY Common Femoral Vein: No evidence of thrombus. Normal compressibility, respiratory phasicity and response to augmentation. Saphenofemoral Junction: No evidence of thrombus. Normal compressibility and flow on color Doppler imaging. Profunda Femoral Vein: No evidence of thrombus. Normal compressibility and flow on color Doppler imaging. Femoral Vein: No evidence of thrombus. Normal compressibility, respiratory phasicity and response to augmentation. Popliteal Vein: No evidence of thrombus. Normal compressibility, respiratory phasicity and response to augmentation. Calf Veins: No evidence of thrombus. Normal compressibility and flow on color Doppler imaging. Superficial Great Saphenous Vein: No evidence of thrombus. Normal compressibility and flow on color Doppler imaging. Other Findings:  None. LEFT LOWER EXTREMITY Common Femoral Vein: No evidence of thrombus. Normal  compressibility, respiratory phasicity and response to augmentation. Saphenofemoral Junction: No evidence of thrombus. Normal compressibility and flow on color Doppler imaging. Profunda Femoral Vein: No evidence of thrombus. Normal compressibility and flow on color Doppler imaging. Femoral Vein: No evidence of thrombus. Normal compressibility, respiratory phasicity and response to augmentation. Popliteal Vein: No evidence of thrombus. Normal compressibility, respiratory phasicity and response to augmentation. Calf Veins: No evidence of thrombus. Normal compressibility and flow on color Doppler imaging. Superficial Great Saphenous Vein: No evidence of thrombus. Normal compressibility and flow on color Doppler imaging. Other Findings:  None. IMPRESSION: Sonographic survey of the bilateral lower extremities negative for DVT Electronically Signed   By: Corrie Mckusick D.O.   On: 07/05/2021 11:18   [place] Upper Ext Art Left  Result Date: 07/12/2021 CLINICAL DATA:  Left upper extremity AV fistula for hemodialysis access. Absent rule with tenderness. Evaluate for thrombosis EXAM: LEFT UPPER EXTREMITY ARTERIAL DUPLEX SCAN TECHNIQUE: Gray-scale sonography as well as color Doppler and duplex ultrasound was performed to evaluate the hemodialysis AV fistula. COMPARISON:  None. FINDINGS: Targeted interrogation of the patient's arteriovenous fistula at the elbow. The fistula is thrombosed with near occlusive clot. There is no internal blood flow. No focal fluid collection is seen. The remainder of the arteries in the left upper extremity were not interrogated. IMPRESSION: Thrombosed AV fistula in the left arm. These results were called by telephone at the time of interpretation on 07/12/2021 at 7:40 pm to provider PHILLIP STAFFORD , who verbally acknowledged these results. Electronically Signed   By: Keith Rake M.D.   On: 07/12/2021 19:41   US Paracentesis  Result Date: 07/04/2021 INDICATION: Ascites, request for  paracentesis. EXAM: ULTRASOUND GUIDED  PARACENTESIS MEDICATIONS: Lidocaine 1% only. COMPLICATIONS: None immediate. PROCEDURE: Informed written consent was obtained from the patient after a discussion of the risks, benefits and alternatives to treatment. A timeout was performed prior to the initiation of the procedure. Initial ultrasound scanning demonstrates a large amount of ascites within the right lower abdominal quadrant. The right lower abdomen was prepped and draped in the usual sterile fashion. 1% lidocaine was used for local anesthesia. Following this, a 6 Fr Safe-T-Centesis catheter was introduced. An ultrasound image was saved for documentation purposes. The paracentesis was performed. The catheter was removed and a dressing was applied. The patient tolerated the procedure well without immediate post procedural complication. FINDINGS: A total of approximately 5 L of clear yellow fluid was removed. Samples were sent to the laboratory as requested by the clinical team. IMPRESSION: Successful ultrasound-guided paracentesis yielding 5 liters of peritoneal fluid. Read By: Tsosie Billing PA-C Electronically Signed   By: Markus Daft M.D.   On: 07/04/2021 16:41   DG Chest Port 1 View  Result Date:  07/17/2021 CLINICAL DATA:  Bleeding of left arm dialysis fistula. EXAM: PORTABLE CHEST 1 VIEW COMPARISON:  July 15, 2021 FINDINGS: Mild, stable diffusely increased interstitial lung markings are seen. There is no evidence of a pleural effusion or pneumothorax. The cardiac silhouette is markedly enlarged and unchanged in size. Marked severity calcification of the thoracic aorta is noted. The visualized skeletal structures are unremarkable. IMPRESSION: 1. Stable cardiomegaly. 2. Mild, stable, diffusely increased interstitial lung markings, which are likely, in part, chronic in nature. A mild superimposed component of interstitial edema cannot be excluded. Electronically Signed   By: Virgina Norfolk M.D.   On:  07/17/2021 00:42   DG Chest Port 1 View  Result Date: 07/15/2021 CLINICAL DATA:  Chest pain EXAM: PORTABLE CHEST 1 VIEW COMPARISON:  07/12/2021, CT 05/03/2021, chest x-ray 06/14/2021, 05/28/2021, 03/20/2021, 10/01/2019 FINDINGS: Cardiomegaly with globular cardiac configuration. Aortic atherosclerosis. Diffuse bilateral ground-glass opacity. No pleural effusion or pneumothorax. IMPRESSION: 1. Cardiomegaly with globular cardiac configuration which may be due to combination of chamber enlargement and pericardial effusion. 2. Diffuse bilateral ground-glass opacity, suspect underlying component of chronic lung disease but difficult to exclude mild superimposed edema Electronically Signed   By: Donavan Foil M.D.   On: 07/15/2021 16:03   DG Chest Portable 1 View  Result Date: 07/12/2021 CLINICAL DATA:  Shortness of breath x2 days. EXAM: PORTABLE CHEST 1 VIEW COMPARISON:  July 02, 2021 (8:32 a.m.) FINDINGS: Low lung volumes are seen. Mild, diffusely increased interstitial lung markings are noted. This is stable in appearance when compared to the prior study. There is no evidence of a pleural effusion or pneumothorax. The cardiac silhouette is markedly enlarged. There is marked severity calcification of the thoracic aorta. The visualized skeletal structures are unremarkable. IMPRESSION: 1. Stable cardiomegaly with mild pulmonary edema. Electronically Signed   By: Virgina Norfolk M.D.   On: 07/12/2021 19:20   DG Chest Portable 1 View  Result Date: 06/26/2021 CLINICAL DATA:  Short of breath EXAM: PORTABLE CHEST 1 VIEW COMPARISON:  None. FINDINGS: Stable enlarged cardiac silhouette. There is diffuse mild airspace disease. No focal consolidation. Pleural fluid or pneumothorax. IMPRESSION: Cardiomegaly and pulmonary edema pattern. Potential component of pericardial effusion. Electronically Signed   By: Suzy Bouchard M.D.   On: 06/26/2021 08:35     Assessment/Plan 1.  Complication dialysis access with  aneurysmal deterioration and probable infection: The patient will require excision of his dialysis access given the aneurysmal nature there is essentially no chance of clearing his infection if it has affected the thrombus within the aneurysms.  This will require surgical removal of his access.  This will be performed under general anesthesia and at that time a temporary femoral catheter will be placed.  I discussed this with the patient.  I have explained the risks and benefits.  The patient has agreed at this point in time to proceed.  2.  End-stage renal disease: Once he has a temporary catheter his dialysis treatments can be reinitiated and he can get caught up on his missed sessions.  Once his blood cultures have been cleared the plans will be for placement of a tunneled dialysis catheter.  He can undergo work-up for placing a access as an outpatient.  3.  Calciphylaxis: Supportive care    Hortencia Pilar, MD  07/17/2021 10:14 AM

## 2021-07-17 NOTE — Progress Notes (Signed)
[facility] Progress Note  Patient: Jordan [name]    HWE:[id number]  DOA: 07/16/2021     Date of Service: the patient was seen and examined on 07/17/2021  Chief Complaint  Patient presents with   Vascular Access Problem   Brief hospital course: Jordan [name] is a 55 y.o. male with medical history significant for ESRD on HD MWF, alcohol abuse, COPD, medication and dialysis noncompliance with frequent hospitalizations for fluid overload, who presents to the hospital by EMS due to bleeding from his left arm dialysis fistula for the past 1 hour that has continued and spite of pressure dressings applied by EMS.,  Patient was recently hospitalized on 10/21 for acute pulmonary edema secondary to missing dialysis having to be intubated for severe respiratory and uremic encephalopathy.  He underwent fistulogram on 10/24 and found to have a thrombosed AV fistula and started on a heparin infusion.  He signed out [place] on 10/24 with a femoral catheter in situ.  He was apparently last dialyzed on 10/25 following which the femoral catheter was pulled.  On the same night he returns to the ED due to bleeding from the left AV fistula. He is denying shortness of breath or chest pain, fever or chills. Of note patient did have positive blood cultures from 10/24 that showed Enterobacterales and Serratia marcescens .  He was seen by ID who recommended cefepime and repeating blood cultures until clearance of bacteremia   ED course: Upon arrival, AV graft had stopped bleeding, per ED provider.  Vitals were stable with BP 117/80, pulse 91 O2 sat 100% and temp 98.6 Blood work: Hemoglobin 6.5, down from 8.4 on 10/24 platelets 130 which is above baseline WBC 9.1 with lactic acid 1.5   EKG, personally viewed and interpreted: Sinus at 92 with no acute ST-T wave changes   Chest x-ray: Mild, stable, diffusely increased interstitial lung markings which are likely, in part chronic in nature.  A mild superimposed  component of interstitial edema cannot be excluded   Patient given cefepime and vancomycin in the ED.  Blood cultures done.  Hospitalist consulted for admission.   Assessment and Plan: Assessment/Plan 55 year old male ESRD on HD MWF, alcohol abuse, COPD, medication and dialysis noncompliance with frequent hospitalizations for fluid overload, with recent AV fistula thrombosis and infection, signing out [place] on 10/24 who returns with bleeding AV fistula   # ABLA (acute blood loss anemia)   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 (Center) - Patient presents with bleeding of left AV fistula with hemoglobin 6.5, down from 8.4 in the setting of recent heparin infusion for thrombosed fistula - Hemostasis achieved after arrival in the ED - Type and cross.  To transfuse during dialysis - SCDs for DVT prophylaxis - Consult vascular surgery  Posttransfusion hemoglobin 7.3    AV fistula infection, subsequent encounter Serratia bacteremia from infected thrombosis - Blood culture from 10/21 with Serratia  - Patient was seen by ID on 10/24 - Continue cefepime - Follow repeat blood cultures     AV fistula thrombosis, subsequent encounter - Vascular consult to place dialysis access to resume dialysis - Patient had femoral dialysis access that was apparently pulled following dialysis on 10/25     ESRD on dialysis [facility]) - Nephrology consult for continuation of dialysis once access is placed - Vascular consulted for access   Suspect scabies - Patient with multiple excoriation marks on extremities - Consider ID consult for confirmation - Contact precautions - Antipruritics  Chronic diastolic CHF (congestive heart failure) (HCC) - Patient does not appear acutely overloaded at this time and chest x-ray appears stable - Continue dialysis     COPD (chronic obstructive pulmonary disease) ([place]) - Not acutely exacerbated   Body mass index is 28.34 kg/m.  Interventions:    Pressure Injury 07/14/21 Buttocks Mid Stage 2 -  Partial thickness loss of dermis presenting as a shallow open injury with a red, pink wound bed without slough. open area (Active)  07/14/21 1402  Location: Buttocks  Location Orientation: Mid  Staging: Stage 2 -  Partial thickness loss of dermis presenting as a shallow open injury with a red, pink wound bed without slough.  Wound Description (Comments): open area  Present on Admission: Yes     Diet: NPO for now, resume dialysis diet when procedure is done DVT Prophylaxis: SCD, pharmacological prophylaxis contraindicated due to active bleeding    Advance goals of care discussion: Full code  Family Communication: family was NOT present at bedside, at the time of interview.  The pt provided permission to discuss medical plan with the family. Opportunity was given to ask question and all questions were answered satisfactorily.   Disposition:  Pt is from Home, admitted with bleeding from AV fistula, bacteremia and needs hemodialysis, still has bacteremia, needs IV antibiotics and hemodialysis, which precludes a safe discharge. Discharge to Home, when stable.  Subjective: Patient was admitted overnight due to bleeding from AV fistula which has been stopped, patient was having mild shortness of breath, no chest pain or palpitations, no any other active issues. Patient agreed for dialysis catheter insertion.  Nephrology and vascular surgery were consulted.  Physical Exam: General:  alert oriented to time, place, and person.  Appear in mild distress, affect appropriate Eyes: PERRLA ENT: Oral Mucosa Clear, moist  Neck: no JVD,  Cardiovascular: S1 and S2 Present, no Murmur,  Respiratory: good respiratory effort, Bilateral Air entry equal and Decreased, mild bibasilar crackles, no significant wheezes Abdomen: Bowel Sound present, Soft and no tenderness,  Skin: no rashes Extremities: 2-3+ Pedal edema, no calf tenderness Neurologic: without any  new focal findings Gait not checked due to patient safety concerns  Vitals:   07/17/21 0237 07/17/21 0505 07/17/21 1154 07/17/21 1539  BP: 110/68 123/79 107/67 (!) 136/92  Pulse: 89 81 87 87  Resp: 17 16 20 20   Temp:    (!) 97.1 F (36.2 C)  TempSrc:    Temporal  SpO2: 98% 99% 98% 99%  Weight:    74.9 kg  Height:    5\' 4"  (1.626 m)   No intake or output data in the 24 hours ending 07/17/21 1712 Filed Weights   07/17/21 1539  Weight: 74.9 kg    Data Reviewed: I have personally reviewed and interpreted daily labs, tele strips, imagings as discussed above. I reviewed all nursing notes, pharmacy notes, vitals, pertinent old records I have discussed plan of care as described above with RN and patient/family.  CBC: Recent Labs  Lab 07/12/21 1851 07/13/21 0311 07/13/21 1317 07/15/21 1503 07/15/21 1928 07/17/21 0110 07/17/21 1232  WBC 13.9* 13.1* 12.4*  --  16.8* 9.1  --   NEUTROABS 12.3*  --   --   --   --  7.5  --   HGB 8.2* 7.2* 6.8* 7.0* 8.4* 6.5* 7.3*  HCT 25.7* 22.5* 22.6*  --  26.8* 20.2* 22.5*  MCV 98.1 97.8 98.7  --  100.8* 94.8  --   PLT 141* 123* 134*  --  133* 130*  --    Basic Metabolic Panel: Recent Labs  Lab 07/12/21 1851 07/13/21 0155 07/13/21 0311 07/13/21 1317 07/15/21 1250 07/15/21 1503 07/17/21 0110  NA 142  --  143 144 139 140 135  K 4.9  --  4.9 5.6* 4.3 4.1 3.5  CL 102  --  103 101 100 101 97*  CO2 20*  --  21* 23 18* 20* 26  GLUCOSE 149*  --  126* 105* 61* 71 101*  BUN 110*  --  108* 133* 85* 88* 57*  CREATININE 14.11*   < > 14.43* 14.84* 10.01* 10.25* 7.40*  CALCIUM 9.1  --  9.1 8.9 8.6* 8.6* 8.3*  MG 2.6*  --   --   --   --   --  1.8  PHOS  --   --   --  9.6*  --  6.6* 5.2*   < > = values in this interval not displayed.    Studies: DG Chest Port 1 View  Result Date: 07/17/2021 CLINICAL DATA:  Bleeding of left arm dialysis fistula. EXAM: PORTABLE CHEST 1 VIEW COMPARISON:  July 15, 2021 FINDINGS: Mild, stable diffusely increased  interstitial lung markings are seen. There is no evidence of a pleural effusion or pneumothorax. The cardiac silhouette is markedly enlarged and unchanged in size. Marked severity calcification of the thoracic aorta is noted. The visualized skeletal structures are unremarkable. IMPRESSION: 1. Stable cardiomegaly. 2. Mild, stable, diffusely increased interstitial lung markings, which are likely, in part, chronic in nature. A mild superimposed component of interstitial edema cannot be excluded. Electronically Signed   By: Virgina Norfolk M.D.   On: 07/17/2021 00:42    Scheduled Meds:  [MAR Hold] Chlorhexidine Gluconate Cloth  6 each Topical Q0600   Continuous Infusions: PRN Meds: [MAR Hold] acetaminophen **OR** [MAR Hold] acetaminophen, [MAR Hold] hydrOXYzine, [MAR Hold] ondansetron **OR** [MAR Hold] ondansetron (ZOFRAN) IV  Time spent: 35 minutes  Author: Val Riles. MD [facility] 07/17/2021 5:12 PM  To reach On-call, see care teams to locate the attending and reach out to them via [email or web address]. If 7PM-7AM, please contact night-coverage If you still have difficulty reaching the attending provider, please page the [facility] (Director on Call) for [facility] on amion for assistance.

## 2021-07-17 NOTE — Anesthesia Postprocedure Evaluation (Signed)
Anesthesia Post Note  Patient: Jordan [name]  Procedure(s) Performed: LIGATION OF ARTERIOVENOUS  FISTULA (Left) TEMPORARY DIALYSIS CATHETER (Left: Groin)  Patient location during evaluation: ICU Anesthesia Type: General Level of consciousness: sedated Pain management: pain level controlled Vital Signs Assessment: vitals unstable Respiratory status: patient re-intubated Cardiovascular status: hypotensive requiring pressors. Anesthetic complications: no Comments: Pt re-intubated in PACU for respiratory failure.  See anesthesia record for detailed notes, but briefly, pt desaturated in PACU with transient systolic BP to 45V and HR to 30s.  BP quickly returned to 136U systolic and HR to 59V after treatment with phenylephrine and vasopressin.  Pt not ventilating adequately on facemask, so bag mask ventilation done with oral airway in place.  O2 sats in 60s despite bag mask, so re-intubated and sats returned to 90s.  Transported to ICU intubated on monitors.  Report given to ICU MD.   No notable events documented.   Last Vitals:  Vitals:   07/17/21 1855 07/17/21 1916  BP: 103/73   Pulse: 86   Resp: 16   Temp: 36.4 C   SpO2: 93% 92%    Last Pain:  Vitals:   07/17/21 1855  TempSrc: Axillary  PainSc:                  Darrin Nipper

## 2021-07-17 NOTE — H&P (Signed)
History and Physical    Jordan [name] DGL:[id number] DOB: [birth year] DOA: 07/16/2021  PCP: [facility], [facility], MD   Patient coming from: home  I have personally briefly reviewed patient's old medical records in [place]  Chief Complaint: Bleeding AV fistula  HPI: Jordan [name] is a 55 y.o. male with medical history significant for ESRD on HD MWF, alcohol abuse, COPD, medication and dialysis noncompliance with frequent hospitalizations for fluid overload, who presents to the hospital by EMS due to bleeding from his left arm dialysis fistula for the past 1 hour that has continued and spite of pressure dressings applied by EMS.,  Patient was recently hospitalized on 10/21 for acute pulmonary edema secondary to missing dialysis having to be intubated for severe respiratory and uremic encephalopathy.  He underwent fistulogram on 10/24 and found to have a thrombosed AV fistula and started on a heparin infusion.  He signed out [place] on 10/24 with a femoral catheter in situ.  He was apparently last dialyzed on 10/25 following which the femoral catheter was pulled.  On the same night he returns to the ED due to bleeding from the left AV fistula. He is denying shortness of breath or chest pain, fever or chills. Of note patient did have positive blood cultures from 10/24 that showed Enterobacterales and Serratia marcescens .  He was seen by ID who recommended cefepime and repeating blood cultures until clearance of bacteremia  ED course: Upon arrival, AV graft had stopped bleeding, per ED provider.  Vitals were stable with BP 117/80, pulse 91 O2 sat 100% and temp 98.6 Blood work: Hemoglobin 6.5, down from 8.4 on 10/24 platelets 130 which is above baseline WBC 9.1 with lactic acid 1.5  EKG, personally viewed and interpreted: Sinus at 92 with no acute ST-T wave changes  Chest x-ray: Mild, stable, diffusely increased interstitial lung markings which are likely, in part chronic in nature.  A mild  superimposed component of interstitial edema cannot be excluded  Patient given cefepime and vancomycin in the ED.  Blood cultures done.  Hospitalist consulted for admission.  Review of Systems: As per HPI otherwise all other systems on review of systems negative.    Past Medical History:  Diagnosis Date   Anemia    Aortic atherosclerosis ([place]) 11/12/2019   ED (erectile dysfunction)    Emphysema of lung ([place]) 11/12/2019   ESRD on hemodialysis ([place])    M/W/F in [place], [place]   ETOH abuse    History of blood transfusion    Hypertension    Wears dentures     Past Surgical History:  Procedure Laterality Date   A/V FISTULAGRAM Left 07/15/2021   Procedure: A/V FISTULAGRAM;  Surgeon: Algernon Huxley, MD;  Location: [facility];  Service: Cardiovascular;  Laterality: Left;   BASCILIC VEIN TRANSPOSITION Left 08/07/2016   Procedure: LEFT BASILIC VEIN TRANSPOSITION;  Surgeon: Angelia Mould, MD;  Location: [facility];  Service: Vascular;  Laterality: Left;   CLOSED REDUCTION NASAL FRACTURE N/A 08/11/2019   Procedure: CLOSED REDUCTION NASAL FRACTURE WITH STABILIZATION;  Surgeon: Irene Limbo, MD;  Location: [facility];  Service: Plastics;  Laterality: N/A;   COLONOSCOPY N/A 08/12/2016   Procedure: COLONOSCOPY;  Surgeon: Otis Brace, MD;  Location: [facility];  Service: Gastroenterology;  Laterality: N/A;   COLONOSCOPY WITH PROPOFOL N/A 06/05/2021   Procedure: COLONOSCOPY WITH PROPOFOL;  Surgeon: Sharyn Creamer, MD;  Location: [facility];  Service: Gastroenterology;  Laterality: N/A;   ESOPHAGOGASTRODUODENOSCOPY N/A 08/12/2016  Procedure: ESOPHAGOGASTRODUODENOSCOPY (EGD);  Surgeon: Otis Brace, MD;  Location: [facility];  Service: Gastroenterology;  Laterality: N/A;   ESOPHAGOGASTRODUODENOSCOPY (EGD) WITH PROPOFOL N/A 06/05/2021   Procedure: ESOPHAGOGASTRODUODENOSCOPY (EGD) WITH PROPOFOL;  Surgeon: Sharyn Creamer, MD;  Location: [facility];  Service: Gastroenterology;   Laterality: N/A;   INSERTION OF DIALYSIS CATHETER N/A 08/07/2016   Procedure: INSERTION OF TUNNELED DIALYSIS CATHETER;  Surgeon: Angelia Mould, MD;  Location: [facility];  Service: Vascular;  Laterality: N/A;   LIGATION OF ARTERIOVENOUS  FISTULA Left 09/12/2016   Procedure: BANDING OF LEFT  ARTERIOVENOUS  FISTULA;  Surgeon: Angelia Mould, MD;  Location: [facility];  Service: Vascular;  Laterality: Left;   LOWER EXTREMITY INTERVENTION Right 12/02/2018   Procedure: LOWER EXTREMITY INTERVENTION;  Surgeon: Algernon Huxley, MD;  Location: [facility];  Service: Cardiovascular;  Laterality: Right;   POLYPECTOMY  06/05/2021   Procedure: POLYPECTOMY;  Surgeon: Sharyn Creamer, MD;  Location: [facility];  Service: Gastroenterology;;     reports that he has been smoking cigarettes. He has a 20.00 pack-year smoking history. He has never used smokeless tobacco. He reports that he does not currently use alcohol after a past usage of about 21.0 standard drinks per week. He reports that he does not use drugs.  Allergies  Allergen Reactions   Lisinopril Swelling    Family History  Problem Relation Age of Onset   Diabetes Mother    Kidney failure Mother    Healthy Father    Kidney failure Brother    Healthy Sister    Kidney disease Daughter    Post-traumatic stress disorder Neg Hx    Bladder Cancer Neg Hx    Kidney cancer Neg Hx       Prior to Admission medications   Medication Sig Start Date End Date Taking? Authorizing Provider  albuterol (VENTOLIN HFA) 108 (90 Base) MCG/ACT inhaler Inhale 2 puffs into the lungs every 4 (four) hours as needed for wheezing or shortness of breath. 06/05/21   Danford, Suann Larry, MD  calcium acetate (PHOSLO) 667 MG capsule Take 2 capsules (1,334 mg total) by mouth 3 (three) times daily with meals. 06/05/21   Danford, Suann Larry, MD  cinacalcet (SENSIPAR) 30 MG tablet Take 1 tablet (30 mg total) by mouth daily with breakfast. 06/05/21   Danford,  Suann Larry, MD  docusate sodium (COLACE) 100 MG capsule Take 1 capsule (100 mg total) by mouth 2 (two) times daily. 06/05/21   Edwin Dada, MD  epoetin alfa (EPOGEN) 4000 UNIT/ML injection Inject 1 mL (4,000 Units total) into the vein every Monday, Wednesday, and Friday with hemodialysis. 06/22/20   Enzo Bi, MD  ferrous sulfate 325 (65 FE) MG tablet Take 1 tablet (325 mg total) by mouth 2 (two) times daily with a meal. 06/05/21   Danford, Suann Larry, MD  metoprolol tartrate (LOPRESSOR) 50 MG tablet Take 1 tablet (50 mg total) by mouth 2 (two) times daily. 07/04/21   Jennye Boroughs, MD  pantoprazole (PROTONIX) 20 MG tablet Take 1 tablet (20 mg total) by mouth 2 (two) times daily before a meal. 06/05/21 08/04/21  Edwin Dada, MD    Physical Exam: Vitals:   07/16/21 2352 07/17/21 0200 07/17/21 0237  BP: 117/80 105/77 110/68  Pulse: 91 88 89  Resp: 15 18 17   Temp: 98.6 F (37 C)    TempSrc: Oral    SpO2: 100% 99% 98%     Vitals:   07/16/21 2352 07/17/21 0200  07/17/21 0237  BP: 117/80 105/77 110/68  Pulse: 91 88 89  Resp: 15 18 17   Temp: 98.6 F (37 C)    TempSrc: Oral    SpO2: 100% 99% 98%      Constitutional: Chronically ill-appearing male alert and oriented x 3 . Not in any apparent distress HEENT:      Head: Normocephalic and atraumatic.         Eyes: PERLA, EOMI, Conjunctivae are normal. Sclera is non-icteric.       Mouth/Throat: Mucous membranes are moist.       Neck: Supple with no signs of meningismus. Cardiovascular: Regular rate and rhythm. No murmurs, gallops, or rubs. 2+ symmetrical distal pulses are present . No JVD. No LE edema Respiratory: Respiratory effort normal .Lungs sounds clear bilaterally. No wheezes, crackles, or rhonchi.  Gastrointestinal: Soft, non tender, and non distended with positive bowel sounds.  Genitourinary: No CVA tenderness. Musculoskeletal: Nontender with normal range of motion in all extremities. No cyanosis, or  erythema of extremities. Neurologic:  Face is symmetric. Moving all extremities. No gross focal neurologic deficits . Skin: Skin is warm, dry.  Multiple superficial ulcers and excoriation marks seen mostly on extremities psychiatric: Mood and affect are normal    Labs on Admission: I have personally reviewed following labs and imaging studies  CBC: Recent Labs  Lab 07/12/21 1851 07/13/21 0311 07/13/21 1317 07/15/21 1503 07/15/21 1928 07/17/21 0110  WBC 13.9* 13.1* 12.4*  --  16.8* 9.1  NEUTROABS 12.3*  --   --   --   --  7.5  HGB 8.2* 7.2* 6.8* 7.0* 8.4* 6.5*  HCT 25.7* 22.5* 22.6*  --  26.8* 20.2*  MCV 98.1 97.8 98.7  --  100.8* 94.8  PLT 141* 123* 134*  --  133* 381*   Basic Metabolic Panel: Recent Labs  Lab 07/12/21 1851 07/13/21 0155 07/13/21 0311 07/13/21 1317 07/15/21 1250 07/15/21 1503 07/17/21 0110  NA 142  --  143 144 139 140 135  K 4.9  --  4.9 5.6* 4.3 4.1 3.5  CL 102  --  103 101 100 101 97*  CO2 20*  --  21* 23 18* 20* 26  GLUCOSE 149*  --  126* 105* 61* 71 101*  BUN 110*  --  108* 133* 85* 88* 57*  CREATININE 14.11*   < > 14.43* 14.84* 10.01* 10.25* 7.40*  CALCIUM 9.1  --  9.1 8.9 8.6* 8.6* 8.3*  MG 2.6*  --   --   --   --   --  1.8  PHOS  --   --   --  9.6*  --  6.6* 5.2*   < > = values in this interval not displayed.   GFR: Estimated Creatinine Clearance: 10.7 mL/min (A) (by C-G formula based on SCr of 7.4 mg/dL (H)). Liver Function Tests: Recent Labs  Lab 07/12/21 1851 07/13/21 0311 07/13/21 1317 07/15/21 1503 07/17/21 0110  AST 16 14*  --   --  16  ALT 16 14  --   --  12  ALKPHOS 108 106  --   --  117  BILITOT 1.5* 1.1  --   --  0.9  PROT 8.0 6.6  --   --  6.6  ALBUMIN 3.2* 2.8* 2.7* 2.7* 2.6*   Recent Labs  Lab 07/12/21 1851  LIPASE 34   No results for input(s): AMMONIA in the last 168 hours. Coagulation Profile: Recent Labs  Lab 07/12/21 1851 07/17/21 0110  INR  1.4* 1.4*   Cardiac Enzymes: No results for input(s):  CKTOTAL, CKMB, CKMBINDEX, TROPONINI in the last 168 hours. BNP (last 3 results) No results for input(s): PROBNP in the last 8760 hours. HbA1C: No results for input(s): HGBA1C in the last 72 hours. CBG: Recent Labs  Lab 07/13/21 0144  GLUCAP 91   Lipid Profile: No results for input(s): CHOL, HDL, LDLCALC, TRIG, CHOLHDL, LDLDIRECT in the last 72 hours. Thyroid Function Tests: No results for input(s): TSH, T4TOTAL, FREET4, T3FREE, THYROIDAB in the last 72 hours. Anemia Panel: No results for input(s): VITAMINB12, FOLATE, FERRITIN, TIBC, IRON, RETICCTPCT in the last 72 hours. Urine analysis:    Component Value Date/Time   COLORURINE YELLOW (A) 01/13/2021 0153   APPEARANCEUR CLEAR (A) 01/13/2021 0153   LABSPEC 1.012 01/13/2021 0153   PHURINE 9.0 (H) 01/13/2021 0153   GLUCOSEU 150 (A) 01/13/2021 0153   HGBUR NEGATIVE 01/13/2021 0153   BILIRUBINUR NEGATIVE 01/13/2021 0153   KETONESUR NEGATIVE 01/13/2021 0153   PROTEINUR >=300 (A) 01/13/2021 0153   NITRITE NEGATIVE 01/13/2021 0153   LEUKOCYTESUR NEGATIVE 01/13/2021 0153    Radiological Exams on Admission: PERIPHERAL VASCULAR CATHETERIZATION  Result Date: 07/15/2021 See surgical note for result.  DG Chest Port 1 View  Result Date: 07/17/2021 CLINICAL DATA:  Bleeding of left arm dialysis fistula. EXAM: PORTABLE CHEST 1 VIEW COMPARISON:  July 15, 2021 FINDINGS: Mild, stable diffusely increased interstitial lung markings are seen. There is no evidence of a pleural effusion or pneumothorax. The cardiac silhouette is markedly enlarged and unchanged in size. Marked severity calcification of the thoracic aorta is noted. The visualized skeletal structures are unremarkable. IMPRESSION: 1. Stable cardiomegaly. 2. Mild, stable, diffusely increased interstitial lung markings, which are likely, in part, chronic in nature. A mild superimposed component of interstitial edema cannot be excluded. Electronically Signed   By: Virgina Norfolk M.D.    On: 07/17/2021 00:42   DG Chest Port 1 View  Result Date: 07/15/2021 CLINICAL DATA:  Chest pain EXAM: PORTABLE CHEST 1 VIEW COMPARISON:  07/12/2021, CT 05/03/2021, chest x-ray 06/14/2021, 05/28/2021, 03/20/2021, 10/01/2019 FINDINGS: Cardiomegaly with globular cardiac configuration. Aortic atherosclerosis. Diffuse bilateral ground-glass opacity. No pleural effusion or pneumothorax. IMPRESSION: 1. Cardiomegaly with globular cardiac configuration which may be due to combination of chamber enlargement and pericardial effusion. 2. Diffuse bilateral ground-glass opacity, suspect underlying component of chronic lung disease but difficult to exclude mild superimposed edema Electronically Signed   By: Donavan Foil M.D.   On: 07/15/2021 16:03     Assessment/Plan 55 year old male ESRD on HD MWF, alcohol abuse, COPD, medication and dialysis noncompliance with frequent hospitalizations for fluid overload, with recent AV fistula thrombosis and infection, signing out [place] on 10/24 who returns with bleeding AV fistula    ABLA (acute blood loss anemia)   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 ([place]) - Patient presents with bleeding of left AV fistula with hemoglobin 6.5, down from 8.4 in the setting of recent heparin infusion for thrombosed fistula - Hemostasis achieved after arrival in the ED - Type and cross.  To transfuse during dialysis - SCDs for DVT prophylaxis - Consult vascular - We will keep n.p.o.    AV fistula infection, subsequent encounter Serratia bacteremia from infected thrombosis - Blood culture from 10/21 with Serratia  - Patient was seen by ID on 10/24 - Continue cefepime - Follow repeat blood cultures    AV fistula thrombosis, subsequent encounter - Vascular consult to place dialysis access to resume dialysis - Patient had femoral  dialysis access that was apparently pulled following dialysis on 10/25    ESRD on dialysis [facility]) - Nephrology consult for continuation  of dialysis once access is placed - Vascular consulted for access  Suspect scabies - Patient with multiple excoriation marks on extremities - Consider ID consult for confirmation - Contact precautions - Antipruritics    Chronic diastolic CHF (congestive heart failure) ([place]) - Patient does not appear acutely overloaded at this time and chest x-ray appears stable - Continue dialysis    COPD (chronic obstructive pulmonary disease) ([place]) - Not acutely exacerbated    DVT prophylaxis: SCDs given bleeding Code Status: full code  Family Communication:  none  Disposition Plan: Back to previous home environment Consults called: Vascular and renal Status:At the time of admission, it appears that the appropriate admission status for this patient is INPATIENT. This is judged to be reasonable and necessary in order to provide the required intensity of service to ensure the patient's safety given the presenting symptoms, physical exam findings, and initial radiographic and laboratory data in the context of their  Comorbid conditions.   Patient requires inpatient status due to high intensity of service, high risk for further deterioration and high frequency of surveillance required.   I certify that at the point of admission it is my clinical judgment that the patient will require inpatient hospital care spanning beyond [place] MD [facility]     07/17/2021, 3:15 AM

## 2021-07-17 NOTE — ED Notes (Signed)
Lab called to collect Blood work

## 2021-07-17 NOTE — Progress Notes (Signed)
[facility]  ROUNDING NOTE   Subjective:   [name] is a 55 year old male with a past medical history including alcohol abuse, atrial fibrillation, COPD, and end-stage renal disease on hemodialysis with noncompliance of treatment.  Patient presents to the emergency room with bleeding of left upper arm dialysis fistula.  He was admitted to [facility] on 07/16/2021 for ABLA (acute blood loss anemia) [D62]   Patient is known to our clinic from multiple previous admissions.  He receives outpatient dialysis at [facility] supervised by [facility] physicians.  He has not received outpatient treatment at this clinic in quite some time.  Patient was recently admitted and left AMA late yesterday evening.  When asked about this he states he had to pick his son up.  He states he began bleeding from his access and return to emergency department when bleeding what night stopped.  During previous admission patient also found to have bloodstream infection requiring antibiotics and said access was found to be thrombosed, unsalvageable, and patient would need temporary access to provide dialysis treatments.  Patient denies fever and cough.  Denies shortness of breath, though currently on oxygen 3L.  we have been consulted to provide dialysis during this admission.    Objective:  Vital signs in last 24 hours:  Temp:  [98.6 F (37 C)-98.9 F (37.2 C)] 98.6 F (37 C) (10/25 2352) Pulse Rate:  [81-111] 87 (10/26 1154) Resp:  [15-20] 20 (10/26 1154) BP: (105-134)/(67-87) 107/67 (10/26 1154) SpO2:  [98 %-100 %] 98 % (10/26 1154)  Weight change:  There were no vitals filed for this visit.   Intake/Output: No intake/output data recorded.   Intake/Output this shift:  No intake/output data recorded.  Physical Exam: General: NAD  Head: Moist oral mucus membranes  Eyes: Anicteric  Lungs:  Clear bilaterally, 3L [place] O2  Heart: Regular rate and rhythm   Abdomen:  Soft, nontender,  distended  Extremities:  no peripheral edema.  Neurologic: Alert and oriented  Skin: Left upper arm edematous, painful to touch, erythema  Access: No usable HD access    Basic Metabolic Panel: Recent Labs  Lab 07/12/21 1851 07/13/21 0155 07/13/21 0311 07/13/21 1317 07/15/21 1250 07/15/21 1503 07/17/21 0110  NA 142  --  143 144 139 140 135  K 4.9  --  4.9 5.6* 4.3 4.1 3.5  CL 102  --  103 101 100 101 97*  CO2 20*  --  21* 23 18* 20* 26  GLUCOSE 149*  --  126* 105* 61* 71 101*  BUN 110*  --  108* 133* 85* 88* 57*  CREATININE 14.11*   < > 14.43* 14.84* 10.01* 10.25* 7.40*  CALCIUM 9.1  --  9.1 8.9 8.6* 8.6* 8.3*  MG 2.6*  --   --   --   --   --  1.8  PHOS  --   --   --  9.6*  --  6.6* 5.2*   < > = values in this interval not displayed.     Liver Function Tests: Recent Labs  Lab 07/12/21 1851 07/13/21 0311 07/13/21 1317 07/15/21 1503 07/17/21 0110  AST 16 14*  --   --  16  ALT 16 14  --   --  12  ALKPHOS 108 106  --   --  117  BILITOT 1.5* 1.1  --   --  0.9  PROT 8.0 6.6  --   --  6.6  ALBUMIN 3.2* 2.8* 2.7* 2.7*  2.6*    Recent Labs  Lab 07/12/21 1851  LIPASE 34    No results for input(s): AMMONIA in the last 168 hours.  CBC: Recent Labs  Lab 07/12/21 1851 07/13/21 0311 07/13/21 1317 07/15/21 1503 07/15/21 1928 07/17/21 0110 07/17/21 1232  WBC 13.9* 13.1* 12.4*  --  16.8* 9.1  --   NEUTROABS 12.3*  --   --   --   --  7.5  --   HGB 8.2* 7.2* 6.8* 7.0* 8.4* 6.5* 7.3*  HCT 25.7* 22.5* 22.6*  --  26.8* 20.2* 22.5*  MCV 98.1 97.8 98.7  --  100.8* 94.8  --   PLT 141* 123* 134*  --  133* 130*  --      Cardiac Enzymes: No results for input(s): CKTOTAL, CKMB, CKMBINDEX, TROPONINI in the last 168 hours.  BNP: Invalid input(s): POCBNP  CBG: Recent Labs  Lab 07/13/21 0144  GLUCAP 2     Microbiology: Results for orders placed or performed during the hospital encounter of 07/16/21  Blood Culture (routine x 2)     Status: None (Preliminary  result)   Collection Time: 07/17/21  1:10 AM   Specimen: BLOOD  Result Value Ref Range Status   Specimen Description BLOOD RIGHT HAND  Final   Special Requests   Final    BOTTLES DRAWN AEROBIC AND ANAEROBIC Blood Culture results may not be optimal due to an excessive volume of blood received in culture bottles   Culture   Final    NO GROWTH < 12 HOURS Performed at [facility], [address] [place], [place] [address]    Report Status PENDING  Incomplete  Blood Culture (routine x 2)     Status: None (Preliminary result)   Collection Time: 07/17/21  1:10 AM   Specimen: BLOOD  Result Value Ref Range Status   Specimen Description BLOOD RIGHT ASSIST CONTROL  Final   Special Requests   Final    BOTTLES DRAWN AEROBIC AND ANAEROBIC Blood Culture adequate volume   Culture   Final    NO GROWTH < 12 HOURS Performed at [facility], [address], [place],  [address]    Report Status PENDING  Incomplete  Resp Panel by RT-PCR (Flu A&B, Covid) Nasopharyngeal Swab     Status: None   Collection Time: 07/17/21  1:10 AM   Specimen: Nasopharyngeal Swab; Nasopharyngeal(NP) swabs in vial transport medium  Result Value Ref Range Status   SARS Coronavirus 2 by RT PCR NEGATIVE NEGATIVE Final    Comment: (NOTE) SARS-CoV-2 target nucleic acids are NOT DETECTED.  The SARS-CoV-2 RNA is generally detectable in upper respiratory specimens during the acute phase of infection. The lowest concentration of SARS-CoV-2 viral copies this assay can detect is 138 copies/mL. A negative result does not preclude SARS-Cov-2 infection and should not be used as the sole basis for treatment or other patient management decisions. A negative result may occur with  improper specimen collection/handling, submission of specimen other than nasopharyngeal swab, presence of viral mutation(s) within the areas targeted by this assay, and inadequate number of viral copies(<138 copies/mL). A negative  result must be combined with clinical observations, patient history, and epidemiological information. The expected result is Negative.  Fact Sheet for Patients:  EntrepreneurPulse.com.au  Fact Sheet for Healthcare Providers:  IncredibleEmployment.be  This test is no t yet approved or cleared by the [place] FDA and  has been authorized for detection and/or diagnosis of SARS-CoV-2 by FDA under an Emergency  Use Authorization (EUA). This EUA will remain  in effect (meaning this test can be used) for the duration of the COVID-19 declaration under Section 564(b)(1) of the Act, 21 U.S.C.section 360bbb-3(b)(1), unless the authorization is terminated  or revoked sooner.       Influenza A by PCR NEGATIVE NEGATIVE Final   Influenza B by PCR NEGATIVE NEGATIVE Final    Comment: (NOTE) The Xpert Xpress SARS-CoV-2/FLU/RSV plus assay is intended as an aid in the diagnosis of influenza from Nasopharyngeal swab specimens and should not be used as a sole basis for treatment. Nasal washings and aspirates are unacceptable for Xpert Xpress SARS-CoV-2/FLU/RSV testing.  Fact Sheet for Patients: EntrepreneurPulse.com.au  Fact Sheet for Healthcare Providers: IncredibleEmployment.be  This test is not yet approved or cleared by the [place] FDA and has been authorized for detection and/or diagnosis of SARS-CoV-2 by FDA under an Emergency Use Authorization (EUA). This EUA will remain in effect (meaning this test can be used) for the duration of the COVID-19 declaration under Section 564(b)(1) of the Act, 21 U.S.C. section 360bbb-3(b)(1), unless the authorization is terminated or revoked.  Performed at [facility], [address] [place], [place] [address]   Aerobic/Anaerobic Culture w Gram Stain (surgical/deep wound)     Status: None (Preliminary result)   Collection Time: 07/17/21  1:10 AM   Specimen:  Left Antecubital; Wound  Result Value Ref Range Status   Specimen Description   Final    LEFT ANTECUBITAL Performed at [facility], [address], [place], [place] [address]    Special Requests   Final    NONE Performed at [facility], [place], [place] [address]    Gram Stain   Final    NO SQUAMOUS EPITHELIAL CELLS SEEN MODERATE WBC SEEN RARE GRAM NEGATIVE RODS Performed at [facility], [place] [address] [place], [place] [address]    Culture PENDING  Incomplete   Report Status PENDING  Incomplete    Coagulation Studies: Recent Labs    07/17/21 0110  LABPROT 17.1*  INR 1.4*     Urinalysis: No results for input(s): COLORURINE, LABSPEC, PHURINE, GLUCOSEU, HGBUR, BILIRUBINUR, KETONESUR, PROTEINUR, UROBILINOGEN, NITRITE, LEUKOCYTESUR in the last 72 hours.  Invalid input(s): APPERANCEUR    Imaging: PERIPHERAL VASCULAR CATHETERIZATION  Result Date: 07/15/2021 See surgical note for result.  DG Chest Port 1 View  Result Date: 07/17/2021 CLINICAL DATA:  Bleeding of left arm dialysis fistula. EXAM: PORTABLE CHEST 1 VIEW COMPARISON:  July 15, 2021 FINDINGS: Mild, stable diffusely increased interstitial lung markings are seen. There is no evidence of a pleural effusion or pneumothorax. The cardiac silhouette is markedly enlarged and unchanged in size. Marked severity calcification of the thoracic aorta is noted. The visualized skeletal structures are unremarkable. IMPRESSION: 1. Stable cardiomegaly. 2. Mild, stable, diffusely increased interstitial lung markings, which are likely, in part, chronic in nature. A mild superimposed component of interstitial edema cannot be excluded. Electronically Signed   By: Virgina Norfolk M.D.   On: 07/17/2021 00:42   DG Chest Port 1 View  Result Date: 07/15/2021 CLINICAL DATA:  Chest pain EXAM: PORTABLE CHEST 1 VIEW COMPARISON:  07/12/2021, CT 05/03/2021, chest x-ray 06/14/2021, 05/28/2021,  03/20/2021, 10/01/2019 FINDINGS: Cardiomegaly with globular cardiac configuration. Aortic atherosclerosis. Diffuse bilateral ground-glass opacity. No pleural effusion or pneumothorax. IMPRESSION: 1. Cardiomegaly with globular cardiac configuration which may be due to combination of chamber enlargement and pericardial effusion. 2. Diffuse bilateral ground-glass opacity, suspect underlying component of chronic  lung disease but difficult to exclude mild superimposed edema Electronically Signed   By: Donavan Foil M.D.   On: 07/15/2021 16:03     Medications:          Assessment/ Plan:  [name] is a 55 y.o. black male with end stage renal disease on hemodialysis, hypertension, COPD, EtOH abuse, atrial fibrillation, congestive heart failure who is admitted to [facility] on ABLA (acute blood loss anemia) [D62]   [facility]. Left AVF 67kg  End Stage Renal Disease on dialysis with fluid volume overload  -Unable to perform dialysis today due to lack of HD access.  Plan for vascular to place temp cath under general anesthesia.  Next treatment scheduled for tomorrow.  Hypotension: Home regimen includes metoprolol.  Held at this time.  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  -We will monitor bone minerals during this admission - Cinacalcet daily and calcium acetate with meals outpatient   Anemia with chronic kidney disease: getting outpatient Mircera as ESA.  -Hemoglobin on arrival 6.5.  Recently increased to 7.3.  5.  Serratia bacteremia positive blood cultures on 07/15/2021.  Followed by ID during previous admission.  Recommendations were repeat blood cultures, 2D echo, and cefepime.   LOS: 0   10/26/202212:53 PM

## 2021-07-17 NOTE — Anesthesia Procedure Notes (Signed)
Procedure Name: Intubation Date/Time: 07/17/2021 4:20 PM Performed by: Juliene Pina, CRNA Pre-anesthesia Checklist: Patient identified, Patient being monitored, Timeout performed, Emergency Drugs available and Suction available Patient Re-evaluated:Patient Re-evaluated prior to induction Oxygen Delivery Method: Circle system utilized Preoxygenation: Pre-oxygenation with 100% oxygen Induction Type: IV induction Ventilation: Mask ventilation without difficulty Laryngoscope Size: McGraph and 4 Grade View: Grade I Tube type: Oral Tube size: 7.5 mm Number of attempts: 1 Airway Equipment and Method: Stylet Placement Confirmation: ETT inserted through vocal cords under direct vision, positive ETCO2 and breath sounds checked- equal and bilateral Secured at: 21 cm Tube secured with: Tape Dental Injury: Teeth and Oropharynx as per pre-operative assessment

## 2021-07-18 ENCOUNTER — Encounter: Payer: Self-pay | Admitting: Vascular Surgery

## 2021-07-18 DIAGNOSIS — R7881 Bacteremia: Secondary | ICD-10-CM

## 2021-07-18 DIAGNOSIS — T827XXA Infection and inflammatory reaction due to other cardiac and vascular devices, implants and grafts, initial encounter: Secondary | ICD-10-CM | POA: Diagnosis not present

## 2021-07-18 DIAGNOSIS — D62 Acute posthemorrhagic anemia: Secondary | ICD-10-CM | POA: Diagnosis not present

## 2021-07-18 DIAGNOSIS — T82868D Thrombosis of vascular prosthetic devices, implants and grafts, subsequent encounter: Secondary | ICD-10-CM | POA: Diagnosis not present

## 2021-07-18 LAB — COMPREHENSIVE METABOLIC PANEL
ALT: 10 U/L (ref 0–44)
AST: 13 U/L — ABNORMAL LOW (ref 15–41)
Albumin: 2.4 g/dL — ABNORMAL LOW (ref 3.5–5.0)
Alkaline Phosphatase: 102 U/L (ref 38–126)
Anion gap: 17 — ABNORMAL HIGH (ref 5–15)
BUN: 64 mg/dL — ABNORMAL HIGH (ref 6–20)
CO2: 22 mmol/L (ref 22–32)
Calcium: 8.3 mg/dL — ABNORMAL LOW (ref 8.9–10.3)
Chloride: 98 mmol/L (ref 98–111)
Creatinine, Ser: 7.94 mg/dL — ABNORMAL HIGH (ref 0.61–1.24)
GFR, Estimated: 7 mL/min — ABNORMAL LOW (ref >60)
Glucose, Bld: 95 mg/dL (ref 70–99)
Potassium: 4.3 mmol/L (ref 3.5–5.1)
Sodium: 137 mmol/L (ref 135–145)
Total Bilirubin: 1.3 mg/dL — ABNORMAL HIGH (ref 0.3–1.2)
Total Protein: 6.2 g/dL — ABNORMAL LOW (ref 6.5–8.1)

## 2021-07-18 LAB — BASIC METABOLIC PANEL
Anion gap: 18 — ABNORMAL HIGH (ref 5–15)
BUN: 67 mg/dL — ABNORMAL HIGH (ref 6–20)
CO2: 22 mmol/L (ref 22–32)
Calcium: 8.4 mg/dL — ABNORMAL LOW (ref 8.9–10.3)
Chloride: 98 mmol/L (ref 98–111)
Creatinine, Ser: 8.28 mg/dL — ABNORMAL HIGH (ref 0.61–1.24)
GFR, Estimated: 7 mL/min — ABNORMAL LOW (ref >60)
Glucose, Bld: 100 mg/dL — ABNORMAL HIGH (ref 70–99)
Potassium: 4.7 mmol/L (ref 3.5–5.1)
Sodium: 138 mmol/L (ref 135–145)

## 2021-07-18 LAB — CBC
HCT: 24 % — ABNORMAL LOW (ref 39.0–52.0)
HCT: 25.8 % — ABNORMAL LOW (ref 39.0–52.0)
Hemoglobin: 7.6 g/dL — ABNORMAL LOW (ref 13.0–17.0)
Hemoglobin: 8.2 g/dL — ABNORMAL LOW (ref 13.0–17.0)
MCH: 29.3 pg (ref 26.0–34.0)
MCH: 29.9 pg (ref 26.0–34.0)
MCHC: 31.7 g/dL (ref 30.0–36.0)
MCHC: 31.8 g/dL (ref 30.0–36.0)
MCV: 92.7 fL (ref 80.0–100.0)
MCV: 94.2 fL (ref 80.0–100.0)
Platelets: 120 10*3/uL — ABNORMAL LOW (ref 150–400)
Platelets: 121 10*3/uL — ABNORMAL LOW (ref 150–400)
RBC: 2.59 MIL/uL — ABNORMAL LOW (ref 4.22–5.81)
RBC: 2.74 MIL/uL — ABNORMAL LOW (ref 4.22–5.81)
RDW: 17.7 % — ABNORMAL HIGH (ref 11.5–15.5)
RDW: 17.7 % — ABNORMAL HIGH (ref 11.5–15.5)
WBC: 6.5 10*3/uL (ref 4.0–10.5)
WBC: 7 10*3/uL (ref 4.0–10.5)
nRBC: 0 % (ref 0.0–0.2)
nRBC: 0 % (ref 0.0–0.2)

## 2021-07-18 LAB — CULTURE, BLOOD (ROUTINE X 2): Special Requests: ADEQUATE

## 2021-07-18 LAB — PHOSPHORUS: Phosphorus: 8.5 mg/dL — ABNORMAL HIGH (ref 2.5–4.6)

## 2021-07-18 LAB — AMMONIA: Ammonia: <10 umol/L (ref 9–35)

## 2021-07-18 LAB — MAGNESIUM: Magnesium: 2 mg/dL (ref 1.7–2.4)

## 2021-07-18 MED ORDER — LIDOCAINE-PRILOCAINE 2.5-2.5 % EX CREA
1.0000 "application " | TOPICAL_CREAM | CUTANEOUS | Status: DC | PRN
  Filled 2021-07-18: qty 5

## 2021-07-18 MED ORDER — ALTEPLASE 2 MG IJ SOLR: 2.0000 mg | Freq: Once | INTRAMUSCULAR | Status: DC | PRN

## 2021-07-18 MED ORDER — SODIUM CHLORIDE 0.9 % IV SOLN: 1.0000 g | INTRAVENOUS | Status: DC

## 2021-07-18 MED ORDER — HEPARIN SODIUM (PORCINE) 1000 UNIT/ML IJ SOLN
INTRAMUSCULAR | Status: AC
  Filled 2021-07-18: qty 1

## 2021-07-18 MED ORDER — PENTAFLUOROPROP-TETRAFLUOROETH EX AERO
1.0000 "application " | INHALATION_SPRAY | CUTANEOUS | Status: DC | PRN
  Filled 2021-07-18: qty 30

## 2021-07-18 MED ORDER — ORAL CARE MOUTH RINSE
15.0000 mL | OROMUCOSAL | Status: DC
  Administered 2021-07-18 – 2021-07-19 (×10): 15 mL via OROMUCOSAL

## 2021-07-18 MED ORDER — SODIUM CHLORIDE 0.9 % IV SOLN: 100.0000 mL | INTRAVENOUS | Status: DC | PRN

## 2021-07-18 MED ORDER — LIDOCAINE HCL (PF) 1 % IJ SOLN
5.0000 mL | INTRAMUSCULAR | Status: DC | PRN
  Filled 2021-07-18: qty 5

## 2021-07-18 MED ORDER — CHLORHEXIDINE GLUCONATE 0.12% ORAL RINSE (MEDLINE KIT)
15.0000 mL | Freq: Two times a day (BID) | OROMUCOSAL | Status: DC
  Administered 2021-07-18 – 2021-07-19 (×3): 15 mL via OROMUCOSAL

## 2021-07-18 MED ORDER — HEPARIN SODIUM (PORCINE) 1000 UNIT/ML DIALYSIS: 1000.0000 [IU] | INTRAMUSCULAR | Status: DC | PRN

## 2021-07-18 MED ORDER — SODIUM CHLORIDE 0.9 % IV SOLN
1.0000 g | INTRAVENOUS | Status: DC
  Administered 2021-07-18 – 2021-07-21 (×4): 1 g via INTRAVENOUS
  Filled 2021-07-18 (×6): qty 1

## 2021-07-18 NOTE — Progress Notes (Signed)
Initial Nutrition Assessment  DOCUMENTATION CODES:   Non-severe (moderate) malnutrition in context of chronic illness  INTERVENTION:   If tube feeds initiated, recommend:   Vital 1.2 @60ml /hr- Initiate at 41ml/hr and increase by 76ml/hr q 8 hours until goal rate is reached.   Free water flushes 65ml q4 hours to maintain tube patency   Regimen provides 1728kcal/day, 108g/day protein and 1338ml/day of free water.   Rena-vite daily via tube   Juven Fruit Punch BID via tube, each serving provides 95kcal and 2.5g of protein (amino acids glutamine and arginine)  Pt at high refeed risk; recommend monitor potassium, magnesium and phosphorus labs daily until stable  NUTRITION DIAGNOSIS:   Moderate Malnutrition related to chronic illness (ESRD on HD, COPD, etoh abuse) as evidenced by moderate fat depletion, severe fat depletion, moderate muscle depletion, severe muscle depletion.  GOAL:   Provide needs based on ASPEN/SCCM guidelines  MONITOR:   Vent status, Labs, Weight trends, Skin, I & O's  REASON FOR ASSESSMENT:   Ventilator    ASSESSMENT:   55 y/o male with h/o ESRD on HD, CHF, COPD, etoh abuse, COVID 19 (04/2021) and HTN who is admitted with infected AV fistula.  Pt sedated and ventilated. No OGT in place. Plan is for possible extubation after HD today. Will plan to initiate tube feeds if pt does not extubate. Pt is likely at refeed risk. Pt with good appetite and oral intake during his last admission; pt documented to have eaten 100% of meals. Of note, pt wears full dentures. Per chart, pt appears fairly weight stable at baseline.   Medications reviewed and include: colace, miralax, cefepime, precedex  Labs reviewed: BUN 67(H), creat 8.28(H), P 8.5(H), Mg 2.0 wnl Hgb 8.2(L), Hct 25.8(L)  Patient is currently intubated on ventilator support MV: 8.9 L/min Temp (24hrs), Avg:97.6 F (36.4 C), Min:97.1 F (36.2 C), Max:97.8 F (36.6 C)  Propofol: none   MAP- >67mmHg    UOP- 0  NUTRITION - FOCUSED PHYSICAL EXAM:  Flowsheet Row Most Recent Value  Orbital Region No depletion  Upper Arm Region Severe depletion  Thoracic and Lumbar Region Moderate depletion  Buccal Region No depletion  Temple Region Mild depletion  Clavicle Bone Region Severe depletion  Clavicle and Acromion Bone Region Severe depletion  Scapular Bone Region Mild depletion  Dorsal Hand Unable to assess  Patellar Region Moderate depletion  Anterior Thigh Region Moderate depletion  Posterior Calf Region Moderate depletion  Edema (RD Assessment) Mild  Hair Reviewed  Eyes Reviewed  Mouth Reviewed  Skin Reviewed  Nails Reviewed   Diet Order:   Diet Order     None      EDUCATION NEEDS:   No education needs have been identified at this time  Skin:  Skin Assessment: Reviewed RN Assessment (ecchymosis, incision groin and arm, Stage II buttocks)  Last BM:  10/26- type 6  Height:   Ht Readings from Last 1 Encounters:  07/17/21 5\' 4"  (1.626 m)    Weight:   Wt Readings from Last 1 Encounters:  07/17/21 74.7 kg    Ideal Body Weight:  59 kg  BMI:  Body mass index is 28.27 kg/m.  Estimated Nutritional Needs:   Kcal:  1622kcal/day  Protein:  100-115g/day  Fluid:  UOP +1L  Koleen Distance MS, RD, LDN Please refer to [facility] for RD and/or RD on-call/weekend/after hours pager

## 2021-07-18 NOTE — Progress Notes (Signed)
[facility]  Daily Progress Note   Subjective  -   Patient remains intubated and sedated.  Had surgery last night to resect infected left arm AV fistula.  Hemodynamics better.  Weaning off pressors now.  Objective Vitals:   07/18/21 0305 07/18/21 0400 07/18/21 0500 07/18/21 0600  BP:  115/82 106/74 108/76  Pulse:  (!) 58 (!) 57 (!) 58  Resp:  20 20 20   Temp:  97.7 F (36.5 C)    TempSrc:  Axillary    SpO2: 98% 98% 98% 99%  Weight:      Height:        Intake/Output Summary (Last 24 hours) at 07/18/2021 0843 Last data filed at 07/18/2021 0600 Gross per 24 hour  Intake 864.15 ml  Output 100 ml  Net 764.15 ml    PULM  CTAB CV  RRR VASC  left arm with mild amount of serosanguineous drainage on the bandage which is intact.  Laboratory CBC    Component Value Date/Time   WBC 6.5 07/18/2021 0423   HGB 8.2 (L) 07/18/2021 0423   HCT 25.8 (L) 07/18/2021 0423   PLT 120 (L) 07/18/2021 0423    BMET    Component Value Date/Time   NA 138 07/18/2021 0423   K 4.7 07/18/2021 0423   CL 98 07/18/2021 0423   CO2 22 07/18/2021 0423   GLUCOSE 100 (H) 07/18/2021 0423   BUN 67 (H) 07/18/2021 0423   CREATININE 8.28 (H) 07/18/2021 0423   CALCIUM 8.4 (L) 07/18/2021 0423   GFRNONAA 7 (L) 07/18/2021 0423   GFRAA 4 (L) 06/20/2020 0414    Assessment/Planning: POD #1 s/p excision of infected left arm AV fistula  Patient remains intubated Hopeful for weaning to extubation today. Leave arm dressing on today and can remove tomorrow unless it saturates   Leotis Pain  07/18/2021, 8:43 AM

## 2021-07-18 NOTE — Progress Notes (Signed)
Date of Admission:  07/16/2021      ID: Jordan [name] is a 55 y.o. 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 After surgery he was extubated but then had to be reintubated for shortness of breath and he is in the ICU.  Subjective:  None available as he is intubated Medications:   chlorhexidine gluconate (MEDLINE KIT)  15 mL Mouth Rinse BID   Chlorhexidine Gluconate Cloth  6 each Topical Q0600   docusate  100 mg Per Tube BID   mouth rinse  15 mL Mouth Rinse 10 times per day   polyethylene glycol  17 g Per Tube Daily    Objective: Vital signs in last 24 hours: Temp:  [97.1 F (36.2 C)-97.8 F (36.6 C)] 97.7 F (36.5 C) (10/27 0900) Pulse Rate:  [51-87] 62 (10/27 1200) Resp:  [12-22] 12 (10/27 1200) BP: (71-136)/(53-92) 109/75 (10/27 1100) SpO2:  [87 %-100 %] 100 % (10/27 1200) FiO2 (%):  [30 %-60 %] 30 % (10/27 1222) Weight:  [74.7 kg-74.9 kg] 74.7 kg (10/26 1855)  PHYSICAL EXAM:  General: Awake, intubated, no distress getting dialysis. Head: Normocephalic, without obvious abnormality, atraumatic. Lungs: Bilateral air entry.  Decreased in the bases. Heart: Irregular Abdomen: Soft, non-tender,not distended. Bowel sounds normal. No masses Extremities: Edema of the ankle Skin: No rashes or lesions. Or bruising Lymph: Cervical, supraclavicular normal. Neurologic: Cannot be assessed. LDAs Left femoral hemodialysis catheter Lab Results Recent Labs    07/17/21 2353 07/18/21 0423  WBC 7.0 6.5  HGB 7.6* 8.2*  HCT 24.0* 25.8*  NA 137 138  K 4.3 4.7  CL 98 98  CO2 22 22  BUN  64* 67*  CREATININE 7.94* 8.28*   Liver Panel Recent Labs    07/17/21 0110 07/17/21 2353  PROT 6.6 6.2*  ALBUMIN 2.6* 2.4*  AST 16 13*  ALT 12 10  ALKPHOS 117 102  BILITOT 0.9 1.3*   Sedimentation Rate No results for input(s): ESRSEDRATE in the last 72 hours. C-Reactive Protein No results for input(s): CRP in the last 72 hours.  Microbiology: 07/15/2021 blood culture Serratia 07/17/2021 blood culture no growth so far 07/17/2021 wound culture from the left AV fistula area is Serratia  Studies/Results: DG Chest Port 1 View  Result Date: 07/17/2021 CLINICAL DATA:  Respiratory failure EXAM: PORTABLE CHEST 1 VIEW COMPARISON:  Chest radiograph dated 07/17/2021. FINDINGS: Endotracheal tube with tip approximately 2.5 cm above the carina. There is cardiomegaly with vascular congestion. No focal consolidation, pleural effusion or pneumothorax. Atherosclerotic calcification of the aorta. No acute osseous pathology. IMPRESSION: Cardiomegaly with vascular congestion. No focal consolidation. Electronically Signed   By: Anner Crete M.D.   On: 07/17/2021 20:47   DG Chest Port 1 View  Result Date: 07/17/2021 CLINICAL DATA:  Bleeding of left arm dialysis fistula. EXAM: PORTABLE CHEST 1 VIEW COMPARISON:  July 15, 2021 FINDINGS: Mild, stable diffusely increased interstitial lung markings are seen. There is no evidence of a pleural effusion or pneumothorax. The cardiac silhouette is markedly enlarged and unchanged in size. Marked severity calcification of the thoracic aorta is noted. The visualized skeletal structures are unremarkable. IMPRESSION:  1. Stable cardiomegaly. 2. Mild, stable, diffusely increased interstitial lung markings, which are likely, in part, chronic in nature. A mild superimposed component of interstitial edema cannot be excluded. Electronically Signed   By: Virgina Norfolk M.D.   On: 07/17/2021 00:42     Assessment/Plan: Serratia bacteremia due to infected Avfistula  which was resected yesterday and culture sent.  The culture is growing Serratia as well. Continue cefepime  Will need 2d echo/TEE to look for endocarditis  ESRD -non compliant with dialysis  Diastolic CHF  Anemia  Acute hypoxic respiratory failure has been intubated.   Discussed the management with the care team

## 2021-07-18 NOTE — Progress Notes (Signed)
[facility]  ROUNDING NOTE   Subjective:   [name] is a 55 year old male with end stage renal disease on hemodialysis, alcohol abuse, atrial fibrillation, COPD.   Patient left AMA on 10/25. Now returns with worsening left AVF infection. Patient underwent left AVF removal by Dr. [name] on 10/26. Currently intubated. Weaned off vasopressors this morning.   PRBC transfusion  yesterday.   Patient receives outpatient dialysis at [facility] supervised by [facility]) Dr. Posey [name].   Objective:  Vital signs in last 24 hours:  Temp:  [97.1 F (36.2 C)-97.8 F (36.6 C)] 97.7 F (36.5 C) (10/27 0400) Pulse Rate:  [57-87] 58 (10/27 0600) Resp:  [16-22] 20 (10/27 0600) BP: (71-136)/(53-92) 108/76 (10/27 0600) SpO2:  [87 %-99 %] 99 % (10/27 0600) FiO2 (%):  [60 %] 60 % (10/27 0305) Weight:  [74.7 kg-74.9 kg] 74.7 kg (10/26 1855)  Weight change:  Filed Weights   07/17/21 1539 07/17/21 1855  Weight: 74.9 kg 74.7 kg     Intake/Output: I/O last 3 completed shifts: In: 864.2 [I.V.:494.2; Blood:370] Out: 100 [Blood:100]   Intake/Output this shift:  No intake/output data recorded.  Physical Exam: General: Critically ill  Head: ETT  Eyes: Anicteric  Lungs:  PRVC FiO2 60%  Heart: bradycardia  Abdomen:  Soft, nontender, distended  Extremities:  no peripheral edema.  Neurologic: Intubated and sedated.   Skin: Left arm dressings  Access: Left femoral temp HD catheter 10/26 Dr. [name]    Basic Metabolic Panel: Recent Labs  Lab 07/12/21 1851 07/13/21 0155 07/13/21 1317 07/15/21 1250 07/15/21 1503 07/17/21 0110 07/17/21 2353 07/18/21 0423  NA 142   < > 144 139 140 135 137 138  K 4.9   < > 5.6* 4.3 4.1 3.5 4.3 4.7  CL 102   < > 101 100 101 97* 98 98  CO2 20*   < > 23 18* 20* 26 22 22   GLUCOSE 149*   < > 105* 61* 71 101* 95 100*  BUN 110*   < > 133* 85* 88* 57* 64* 67*  CREATININE 14.11*   < > 14.84* 10.01* 10.25* 7.40*  7.94* 8.28*  CALCIUM 9.1   < > 8.9 8.6* 8.6* 8.3* 8.3* 8.4*  MG 2.6*  --   --   --   --  1.8  --  2.0  PHOS  --   --  9.6*  --  6.6* 5.2*  --  8.5*   < > = values in this interval not displayed.     Liver Function Tests: Recent Labs  Lab 07/12/21 1851 07/13/21 0311 07/13/21 1317 07/15/21 1503 07/17/21 0110 07/17/21 2353  AST 16 14*  --   --  16 13*  ALT 16 14  --   --  12 10  ALKPHOS 108 106  --   --  117 102  BILITOT 1.5* 1.1  --   --  0.9 1.3*  PROT 8.0 6.6  --   --  6.6 6.2*  ALBUMIN 3.2* 2.8* 2.7* 2.7* 2.6* 2.4*    Recent Labs  Lab 07/12/21 1851  LIPASE 34    Recent Labs  Lab 07/17/21 2353  AMMONIA <10    CBC: Recent Labs  Lab 07/12/21 1851 07/13/21 0311 07/13/21 1317 07/15/21 1503 07/15/21 1928 07/17/21 0110 07/17/21 1232 07/17/21 2353 07/18/21 0423  WBC 13.9*   < > 12.4*  --  16.8* 9.1  --  7.0 6.5  NEUTROABS 12.3*  --   --   --   --  7.5  --   --   --   HGB 8.2*   < > 6.8*   < > 8.4* 6.5* 7.3* 7.6* 8.2*  HCT 25.7*   < > 22.6*  --  26.8* 20.2* 22.5* 24.0* 25.8*  MCV 98.1   < > 98.7  --  100.8* 94.8  --  92.7 94.2  PLT 141*   < > 134*  --  133* 130*  --  121* 120*   < > = values in this interval not displayed.     Cardiac Enzymes: No results for input(s): CKTOTAL, CKMB, CKMBINDEX, TROPONINI in the last 168 hours.  BNP: Invalid input(s): POCBNP  CBG: Recent Labs  Lab 07/13/21 0144 07/17/21 2014  GLUCAP 91 80     Microbiology: Results for orders placed or performed during the hospital encounter of 07/16/21  Blood Culture (routine x 2)     Status: None (Preliminary result)   Collection Time: 07/17/21  1:10 AM   Specimen: BLOOD  Result Value Ref Range Status   Specimen Description BLOOD RIGHT HAND  Final   Special Requests   Final    BOTTLES DRAWN AEROBIC AND ANAEROBIC Blood Culture results may not be optimal due to an excessive volume of blood received in culture bottles   Culture   Final    NO GROWTH 1 DAY Performed at [facility], [address], [place], [place] [address]    Report Status PENDING  Incomplete  Blood Culture (routine x 2)     Status: None (Preliminary result)   Collection Time: 07/17/21  1:10 AM   Specimen: BLOOD  Result Value Ref Range Status   Specimen Description BLOOD RIGHT ASSIST CONTROL  Final   Special Requests   Final    BOTTLES DRAWN AEROBIC AND ANAEROBIC Blood Culture adequate volume   Culture   Final    NO GROWTH 1 DAY Performed at [facility], [address] [place], [place] [address]    Report Status PENDING  Incomplete  Resp Panel by RT-PCR (Flu A&B, Covid) Nasopharyngeal Swab     Status: None   Collection Time: 07/17/21  1:10 AM   Specimen: Nasopharyngeal Swab; Nasopharyngeal(NP) swabs in vial transport medium  Result Value Ref Range Status   SARS Coronavirus 2 by RT PCR NEGATIVE NEGATIVE Final    Comment: (NOTE) SARS-CoV-2 target nucleic acids are NOT DETECTED.  The SARS-CoV-2 RNA is generally detectable in upper respiratory specimens during the acute phase of infection. The lowest concentration of SARS-CoV-2 viral copies this assay can detect is 138 copies/mL. A negative result does not preclude SARS-Cov-2 infection and should not be used as the sole basis for treatment or other patient management decisions. A negative result may occur with  improper specimen collection/handling, submission of specimen other than nasopharyngeal swab, presence of viral mutation(s) within the areas targeted by this assay, and inadequate number of viral copies(<138 copies/mL). A negative result must be combined with clinical observations, patient history, and epidemiological information. The expected result is Negative.  Fact Sheet for Patients:  EntrepreneurPulse.com.au  Fact Sheet for Healthcare Providers:  IncredibleEmployment.be  This test is no t yet approved or cleared by the [place] FDA and  has been authorized  for detection and/or diagnosis of SARS-CoV-2 by FDA under an Emergency Use Authorization (EUA). This EUA will remain  in effect (meaning this test can be used) for the duration of the COVID-19 declaration under Section 564(b)(1) of the Act, 21 U.S.C.section 360bbb-3(b)(1), unless  the authorization is terminated  or revoked sooner.       Influenza A by PCR NEGATIVE NEGATIVE Final   Influenza B by PCR NEGATIVE NEGATIVE Final    Comment: (NOTE) The Xpert Xpress SARS-CoV-2/FLU/RSV plus assay is intended as an aid in the diagnosis of influenza from Nasopharyngeal swab specimens and should not be used as a sole basis for treatment. Nasal washings and aspirates are unacceptable for Xpert Xpress SARS-CoV-2/FLU/RSV testing.  Fact Sheet for Patients: EntrepreneurPulse.com.au  Fact Sheet for Healthcare Providers: IncredibleEmployment.be  This test is not yet approved or cleared by the [place] FDA and has been authorized for detection and/or diagnosis of SARS-CoV-2 by FDA under an Emergency Use Authorization (EUA). This EUA will remain in effect (meaning this test can be used) for the duration of the COVID-19 declaration under Section 564(b)(1) of the Act, 21 U.S.C. section 360bbb-3(b)(1), unless the authorization is terminated or revoked.  Performed at [facility], [address] [place], [place] [address]   Aerobic/Anaerobic Culture w Gram Stain (surgical/deep wound)     Status: None (Preliminary result)   Collection Time: 07/17/21  1:10 AM   Specimen: Left Antecubital; Wound  Result Value Ref Range Status   Specimen Description   Final    LEFT ANTECUBITAL Performed at [facility], [address] [place], [place] [address]    Special Requests   Final    NONE Performed at [facility], [place], [place] [address]    Gram Stain   Final    NO SQUAMOUS EPITHELIAL CELLS SEEN MODERATE WBC  SEEN RARE GRAM NEGATIVE RODS Performed at [facility], [place] [address], [place], [place] [address]    Culture PENDING  Incomplete   Report Status PENDING  Incomplete  Aerobic/Anaerobic Culture w Gram Stain (surgical/deep wound)     Status: None (Preliminary result)   Collection Time: 07/17/21  5:30 PM   Specimen: PATH Other; Tissue  Result Value Ref Range Status   Specimen Description   Final    WOUND Performed at [facility], [address], [place], [place] [address]    Special Requests   Final    LEFT UPPER ARM Performed at [facility] [address] [place], [place] [address]    Gram Stain   Final    RARE WBC PRESENT, PREDOMINANTLY PMN NO ORGANISMS SEEN Performed at [facility], [place] [address], [place], [place] [address]    Culture PENDING  Incomplete   Report Status PENDING  Incomplete  Aerobic/Anaerobic Culture w Gram Stain (surgical/deep wound)     Status: None (Preliminary result)   Collection Time: 07/17/21  5:50 PM   Specimen: PATH Other; Tissue  Result Value Ref Range Status   Specimen Description   Final    WOUND Performed at [facility], [address] [place], [place] [address]    Special Requests   Final    LEFT UPPER ARM Performed at [facility], [address] [place], [place] [address]    Gram Stain   Final    FEW WBC PRESENT, PREDOMINANTLY PMN FEW GRAM NEGATIVE RODS Performed at [facility], [place] [address] [place], [place] [address]    Culture PENDING  Incomplete   Report Status PENDING  Incomplete    Coagulation Studies: Recent Labs    07/17/21 0110  LABPROT 17.1*  INR 1.4*     Urinalysis: No results for input(s): COLORURINE, LABSPEC, PHURINE,  GLUCOSEU, HGBUR, BILIRUBINUR, KETONESUR, PROTEINUR, UROBILINOGEN, NITRITE, LEUKOCYTESUR in the last 72 hours.  Invalid input(s): APPERANCEUR    Imaging: DG Chest Port 1 View  Result Date: 07/17/2021 CLINICAL DATA:   Respiratory failure EXAM: PORTABLE CHEST 1 VIEW COMPARISON:  Chest radiograph dated 07/17/2021. FINDINGS: Endotracheal tube with tip approximately 2.5 cm above the carina. There is cardiomegaly with vascular congestion. No focal consolidation, pleural effusion or pneumothorax. Atherosclerotic calcification of the aorta. No acute osseous pathology. IMPRESSION: Cardiomegaly with vascular congestion. No focal consolidation. Electronically Signed   By: Anner Crete M.D.   On: 07/17/2021 20:47   DG Chest Port 1 View  Result Date: 07/17/2021 CLINICAL DATA:  Bleeding of left arm dialysis fistula. EXAM: PORTABLE CHEST 1 VIEW COMPARISON:  July 15, 2021 FINDINGS: Mild, stable diffusely increased interstitial lung markings are seen. There is no evidence of a pleural effusion or pneumothorax. The cardiac silhouette is markedly enlarged and unchanged in size. Marked severity calcification of the thoracic aorta is noted. The visualized skeletal structures are unremarkable. IMPRESSION: 1. Stable cardiomegaly. 2. Mild, stable, diffusely increased interstitial lung markings, which are likely, in part, chronic in nature. A mild superimposed component of interstitial edema cannot be excluded. Electronically Signed   By: Virgina Norfolk M.D.   On: 07/17/2021 00:42     Medications:    ceFEPime (MAXIPIME) IV     dexmedetomidine (PRECEDEX) IV infusion 0.3 mcg/kg/hr (07/18/21 0752)   fentaNYL infusion INTRAVENOUS 50 mcg/hr (07/18/21 0600)   norepinephrine (LEVOPHED) Adult infusion Stopped (07/17/21 2305)      chlorhexidine gluconate (MEDLINE KIT)  15 mL Mouth Rinse BID   Chlorhexidine Gluconate Cloth  6 each Topical Q0600   docusate  100 mg Per Tube BID   mouth rinse  15 mL Mouth Rinse 10 times per day   polyethylene glycol  17 g Per Tube Daily      Assessment/ Plan:  [name] is a 55 y.o. black male with end stage renal disease on hemodialysis, hypertension, COPD, EtOH abuse, atrial  fibrillation, congestive heart failure who is admitted to [facility] on 07/16/2021 for Bacteremia [R78.81] ESRD (end stage renal disease) ([place]) [N18.6] Wound infection [T14.8XXA, L08.9] ESRD on hemodialysis ([place]) [N18.6, Z99.2] Acute on chronic anemia [D64.9] ABLA (acute blood loss anemia) [D62]   [facility] MWF [address] [address] Left AVF 67kg   End Stage Renal Disease on dialysis with fluid volume overload. With complication of dialysis device. Status post removal of infected left arm AVF on 10/26 by Dr. [name]. Dialysis scheduled for today with femoral dialysis catheter. Orders prepared. Anticipate dialysis again tomorrow to return to MWF schedule.   Hypotension: secondary to blood loss, bacteremia and hypotension. Weaned off norepinephrine. Holding metoprolol due to bradycardia.   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 If patient survives this admission, will need to be placed on sodium thiosulfate.   Anemia with chronic kidney disease: with acute blood loss anemia. Status post PRBC transfusion on 10/26. Initiate EPO with MWF HD treatments.   5.  Sepsis with bacteremia: blood cultures on 10/24 with serratia species. Source seems to be AVF with superimposed cellulitis. Now removed and placed on cefepime. Appreciate ID, vascular, and critical care input.    LOS: 1 Ralphie Lovelady 10/27/20228:19 AM

## 2021-07-18 NOTE — Consult Note (Signed)
NAME:  Jordan [name], MRN:  [id number], DOB:  12/17/1965, LOS: 1 ADMISSION DATE:  07/16/2021, CONSULTATION DATE:  07/17/2021 REFERRING MD:  Darrin Nipper, MD CHIEF COMPLAINT:  Respiratory Failure   History of Present Illness:  Jordan [name] is a 55 year old male, daily smoker with history of end-stage renal disease on intermittent hemodialysis, hypertension alcohol abuse, and COPD who was admitted 10/25 due to bleeding from his AV fistula.  He received dialysis on 10/25 prior to admission. He was previously admitted 10/21 for volume overload with pulmonary edema requiring mechanical intubation due to missed dialysis session. He underwent fistulogram on 10/24 and found to have thrombosed AV fistula. He was started on IV heparin  and signed out AMA later on 10/24. Patient's blood cultures on from 10/24 shoed enterobacterales and serratia. ID is following and patient has been treated with cefepime. He was taken to the OR 10/26 by Vascular Surgery for removal of infected left brachial basilic AV fistula and insertion of trialysis catheter of the left femoral vein. He was not successfully extubated after the procedure and was re-intubated and transferred to the ICU.   PCCM has been consulted for further management.  Pertinent  Medical History   Past Medical History:  Diagnosis Date   Anemia    Aortic atherosclerosis ([place]) 11/12/2019   ED (erectile dysfunction)    Emphysema of lung ([place]) 11/12/2019   ESRD on hemodialysis ([place])    M/W/F in [place], [place]   ETOH abuse    History of blood transfusion    Hypertension    Wears dentures    Significant Hospital Events: Including procedures, antibiotic start and stop dates in addition to other pertinent events   10/25 admitted 10/26 s/p removal of infected left brachial basilic AV fistula and insertion of trialysis catheter of the left femoral vein. Admitted to ICU post-operatively.  Interim History / Subjective:   No acute issues overnight.  Plan for HD today. Seems to indicate that he's hungry.   Objective   Blood pressure 108/76, pulse (!) 58, temperature 97.7 F (36.5 C), temperature source Axillary, resp. rate 20, height 5\' 4"  (1.626 m), weight 74.7 kg, SpO2 99 %.    Vent Mode: PRVC FiO2 (%):  [60 %] 60 % Set Rate:  [16 bmp-20 bmp] 20 bmp Vt Set:  [450 mL] 450 mL PEEP:  [8 cmH20-10 cmH20] 10 cmH20 Plateau Pressure:  [20 INO67-67 cmH20] 20 cmH20   Intake/Output Summary (Last 24 hours) at 07/18/2021 2094 Last data filed at 07/18/2021 0600 Gross per 24 hour  Intake 864.15 ml  Output 100 ml  Net 764.15 ml   Filed Weights   07/17/21 1539 07/17/21 1855  Weight: 74.9 kg 74.7 kg    Examination: General: intubated, sedated HENT: [place]/AT, PERRL, sclera anicteric, tracking Lungs: mech breath sounds bl, no wheezing or rhonchi Cardiovascular: RRR, no murmur Abdomen: soft, non-distended, +BS Extremities: warm, trace edema Neuro: wakes to loud voice, follows commands   Labs reviewed   CBC stable Mild AGMA NH3 36  Path from left upper arm with GNR   No new imaging  Resolved Hospital Problem list     Assessment & Plan:  Acute Hypoxemic and Hypercapnic Respiratory Failure in post-op setting - continue mechanical ventilatory support - Will increase PEEP to 10 and RR to 20 based on ABG - PAD pathway ordered, RASS goal of -2, fentanyl and precedex ordered - SAT/SBT after HD if on 8 of peep, =<50% FiO2 - May require dialysis prior to  extubation  Septic Shock Bacteremia due to Serratia marcescens and enterobacterales - Cefepime per ID recs - f/u repeat BCx - Follow up culture of AV fistula thrombectomy - levo weaned off, may be required during HD  AV Fistula Thrombectomy S/p removal of fistula by vascular surgery 10/26 - further recs/management per vascular surgery  Encephalopathy In setting of shock and respiratory failure - keep RASS 0 to -1 - full vent support as above  Acute Blood Loss  Anemia Pseduoaneurysm of left brachiocephalic AV Fistula S/p 2 units PRBCs 10/26 with post-transfusion hemoglobin 7.3g/dL - monitor H/H - transfuse for hemoglobin less than 7g/dL  ESRD on HD - nephrology following for dialysis needs - HD before SAT/SBT   Chronic Diastolic Heart Failure - will monitor for volume overload  COPD - not acutely exacerbated  Best Practice (right click and "Reselect all SmartList Selections" daily)   Diet/type: NPO DVT prophylaxis: prophylactic heparin  GI prophylaxis: PPI Lines: Dialysis Catheter Foley:  Yes, and it is still needed Code Status:  full code Last date of multidisciplinary goals of care discussion [n/a]  Critical care time: 2 minutes    [place] for personal pager PCCM on call pager [phone] until 7pm. Please call Elink 7p-7a. [phone]

## 2021-07-18 NOTE — Progress Notes (Addendum)
Pharmacy Antibiotic Note  Jordan [name] is a 55 y.o. male admitted on 07/16/2021 with  AV fistula infection . PMH includes ESRD on HD MWF. Pharmacy has been consulted for cefepime dosing.  Patient re-admitted after leaving AMA on 10/25. Upon readmission, given additional cefepime 2 gram dose, which was within 24 hours and prior to next HD session.Cefepime 70-85% dialyzable assuming 3.5-4 hour HD session.   Intubated in post-op after removal of AV fistula and insertion of trialysis catheter left femoral vein. Plan per nephro for dialysis on 10/28. Will start cefepime for tomorrow.  Update: Patient currently on HD for planned duration of 3 hours.   Plan: Give cefepime 1 gram every 24 hours starting tonight @1800 . Monitor renal function, neuro status, HD plans and LOT.   Height: 5\' 4"  (162.6 cm) Weight: 77.1 kg (169 lb 15.6 oz) IBW/kg (Calculated) : 59.2  Temp (24hrs), Avg:97.6 F (36.4 C), Min:97.1 F (36.2 C), Max:97.8 F (36.6 C)  Recent Labs  Lab 07/13/21 1317 07/15/21 1250 07/15/21 1503 07/15/21 1928 07/17/21 0110 07/17/21 1232 07/17/21 2353 07/18/21 0423  WBC 12.4*  --   --  16.8* 9.1  --  7.0 6.5  CREATININE 14.84* 10.01* 10.25*  --  7.40*  --  7.94* 8.28*  LATICACIDVEN  --   --   --   --  1.5 1.2  --   --      Estimated Creatinine Clearance: 9.7 mL/min (A) (by C-G formula based on SCr of 8.28 mg/dL (H)).    Allergies  Allergen Reactions   Lisinopril Swelling    Antimicrobials this admission: cefepime 10/25 >>  vancomycin 10/26 >> 10/26  Dose adjustments this admission: Cefepime 2 grams qHD > given additional cefepime 2 gram dose > decrease to cefepime 1 gram x 1  Microbiology results: 10/24 BCx: Serratia marcescens (R cefazolin) 10/26 BCx: NG < 12 HRs  10/26 Left antecubital: rare GNR    Thank you for allowing pharmacy to be a part of this patient's care.   Wynelle Cleveland, PharmD Pharmacy Resident  07/18/2021 2:58 PM

## 2021-07-19 ENCOUNTER — Inpatient Hospital Stay
Admit: 2021-07-19 | Discharge: 2021-07-19 | Disposition: A | Discharge status: Home / Self Care | Payer: Medicare Other | Attending: Infectious Diseases | Admitting: Infectious Diseases

## 2021-07-19 DIAGNOSIS — T148XXA Other injury of unspecified body region, initial encounter: Secondary | ICD-10-CM

## 2021-07-19 DIAGNOSIS — N186 End stage renal disease: Secondary | ICD-10-CM | POA: Diagnosis not present

## 2021-07-19 DIAGNOSIS — T827XXA Infection and inflammatory reaction due to other cardiac and vascular devices, implants and grafts, initial encounter: Secondary | ICD-10-CM | POA: Diagnosis not present

## 2021-07-19 DIAGNOSIS — R7881 Bacteremia: Secondary | ICD-10-CM | POA: Diagnosis not present

## 2021-07-19 DIAGNOSIS — J9601 Acute respiratory failure with hypoxia: Secondary | ICD-10-CM

## 2021-07-19 DIAGNOSIS — Z992 Dependence on renal dialysis: Secondary | ICD-10-CM

## 2021-07-19 DIAGNOSIS — L089 Local infection of the skin and subcutaneous tissue, unspecified: Secondary | ICD-10-CM

## 2021-07-19 LAB — BASIC METABOLIC PANEL
Anion gap: 11 (ref 5–15)
BUN: 43 mg/dL — ABNORMAL HIGH (ref 6–20)
CO2: 27 mmol/L (ref 22–32)
Calcium: 8.1 mg/dL — ABNORMAL LOW (ref 8.9–10.3)
Chloride: 96 mmol/L — ABNORMAL LOW (ref 98–111)
Creatinine, Ser: 5.81 mg/dL — ABNORMAL HIGH (ref 0.61–1.24)
GFR, Estimated: 11 mL/min — ABNORMAL LOW (ref >60)
Glucose, Bld: 101 mg/dL — ABNORMAL HIGH (ref 70–99)
Potassium: 4 mmol/L (ref 3.5–5.1)
Sodium: 134 mmol/L — ABNORMAL LOW (ref 135–145)

## 2021-07-19 LAB — CBC
HCT: 26.2 % — ABNORMAL LOW (ref 39.0–52.0)
Hemoglobin: 8.8 g/dL — ABNORMAL LOW (ref 13.0–17.0)
MCH: 30.9 pg (ref 26.0–34.0)
MCHC: 33.6 g/dL (ref 30.0–36.0)
MCV: 91.9 fL (ref 80.0–100.0)
Platelets: 145 10*3/uL — ABNORMAL LOW (ref 150–400)
RBC: 2.85 MIL/uL — ABNORMAL LOW (ref 4.22–5.81)
RDW: 17.1 % — ABNORMAL HIGH (ref 11.5–15.5)
WBC: 8.5 10*3/uL (ref 4.0–10.5)
nRBC: 0 % (ref 0.0–0.2)

## 2021-07-19 LAB — SURGICAL PATHOLOGY

## 2021-07-19 LAB — PHOSPHORUS: Phosphorus: 6.7 mg/dL — ABNORMAL HIGH (ref 2.5–4.6)

## 2021-07-19 LAB — MAGNESIUM
Magnesium: 1.7 mg/dL (ref 1.7–2.4)
Magnesium: 2 mg/dL (ref 1.7–2.4)

## 2021-07-19 MED ORDER — HEPARIN SODIUM (PORCINE) 1000 UNIT/ML IJ SOLN
INTRAMUSCULAR | Status: AC
  Filled 2021-07-19: qty 1

## 2021-07-19 MED ORDER — IPRATROPIUM-ALBUTEROL 0.5-2.5 (3) MG/3ML IN SOLN
3.0000 mL | Freq: Four times a day (QID) | RESPIRATORY_TRACT | Status: DC
  Administered 2021-07-19 – 2021-07-21 (×7): 3 mL via RESPIRATORY_TRACT
  Filled 2021-07-19 (×8): qty 3

## 2021-07-19 MED ORDER — ORAL CARE MOUTH RINSE
15.0000 mL | Freq: Two times a day (BID) | OROMUCOSAL | Status: DC
  Administered 2021-07-20 (×2): 15 mL via OROMUCOSAL

## 2021-07-19 MED ORDER — DIPHENHYDRAMINE-ZINC ACETATE 2-0.1 % EX CREA
TOPICAL_CREAM | CUTANEOUS | Status: DC | PRN
  Filled 2021-07-19 (×3): qty 28

## 2021-07-19 MED ORDER — FAMOTIDINE 40 MG/5ML PO SUSR
20.0000 mg | Freq: Two times a day (BID) | ORAL | Status: DC
  Administered 2021-07-20: 20 mg via ORAL
  Filled 2021-07-19 (×4): qty 2.5

## 2021-07-19 MED ORDER — CAMPHOR-MENTHOL 0.5-0.5 % EX LOTN
TOPICAL_LOTION | CUTANEOUS | Status: DC | PRN
  Administered 2021-07-20: 1 via TOPICAL
  Filled 2021-07-19: qty 222

## 2021-07-19 NOTE — Progress Notes (Signed)
ID Pt is just getting extubated Awake   Patient Vitals for the past 24 hrs:  BP Temp Temp src Pulse Resp SpO2 Weight  07/19/21 1515 (!) 88/58 -- -- 84 19 97 % --  07/19/21 1500 (!) 86/62 -- -- 80 14 (!) 81 % --  07/19/21 1445 (!) 92/58 -- -- (!) 55 15 91 % --  07/19/21 1430 (!) 86/59 -- -- 77 16 90 % --  07/19/21 1415 (!) 86/60 -- -- 74 19 93 % --  07/19/21 1400 (!) 82/52 -- -- (!) 54 18 94 % --  07/19/21 1332 -- -- -- -- -- 95 % --  07/19/21 1331 -- -- -- -- -- 95 % --  07/19/21 1330 (!) 89/55 -- -- 72 14 92 % --  07/19/21 1315 113/76 -- -- 83 11 (!) 89 % --  07/19/21 1302 -- -- -- 84 12 90 % --  07/19/21 1301 -- -- -- 79 13 90 % 76.3 kg  07/19/21 1300 119/82 98.2 F (36.8 C) Axillary 80 13 90 % --  07/19/21 1255 -- -- -- 79 15 95 % --  07/19/21 1250 112/76 -- -- 89 13 (!) 88 % --  07/19/21 1245 -- -- -- 81 20 95 % --  07/19/21 1230 114/82 -- -- 79 15 (!) 87 % --  07/19/21 1215 (!) 126/92 -- -- 78 13 96 % --  07/19/21 1200 121/82 -- -- 79 14 94 % --  07/19/21 1145 130/87 -- -- 79 14 95 % --  07/19/21 1130 138/90 -- -- 89 12 93 % --  07/19/21 1115 130/84 -- -- 78 13 95 % --  07/19/21 1100 97/68 -- -- 79 14 92 % --  07/19/21 1045 134/90 -- -- 77 13 95 % --  07/19/21 1030 133/89 -- -- 78 11 94 % --  07/19/21 1015 (!) 128/91 -- -- 82 12 92 % --  07/19/21 1000 125/79 -- -- 74 13 94 % --  07/19/21 0945 120/79 -- -- 80 11 94 % --  07/19/21 0939 -- 97.9 F (36.6 C) Axillary -- -- -- --  07/19/21 0930 122/80 -- -- 80 13 91 % --  07/19/21 0915 116/74 -- -- 83 14 92 % --  07/19/21 0855 -- -- -- -- -- -- 77.4 kg  07/19/21 0827 -- -- -- -- -- 95 % --  07/19/21 0745 -- 97.9 F (36.6 C) Oral -- -- -- --  07/19/21 0700 111/74 -- -- (!) 59 20 98 % --  07/19/21 0600 105/73 -- -- (!) 58 20 97 % --  07/19/21 0500 102/71 -- -- 64 (!) 22 98 % --  07/19/21 0400 110/74 (!) 97.3 F (36.3 C) Axillary (!) 58 20 97 % --  07/19/21 0300 108/74 -- -- (!) 59 20 97 % --  07/19/21 0253 -- -- -- -- --  97 % --  07/19/21 0200 106/73 -- -- (!) 58 (!) 21 97 % --  07/19/21 0100 108/72 -- -- (!) 59 20 97 % --  07/19/21 0000 106/74 -- -- 61 20 97 % --  07/18/21 2300 106/72 -- -- 60 20 97 % --  07/18/21 2200 106/75 -- -- (!) 55 20 98 % --  07/18/21 2100 106/73 -- -- (!) 59 20 98 % --  07/18/21 2000 101/71 (!) 97.2 F (36.2 C) Axillary 61 20 98 % --  07/18/21 1918 -- -- -- -- -- 100 % --  07/18/21 1830 111/80 -- --  65 (!) 21 100 % --  07/18/21 1824 -- 97.8 F (36.6 C) Oral -- -- -- --  07/18/21 1823 -- -- -- -- -- -- 77 kg  07/18/21 1815 108/79 -- -- 65 20 100 % --  07/18/21 1800 140/80 -- -- 64 20 97 % --  07/18/21 1745 100/77 -- -- 62 20 99 % --  07/18/21 1735 (!) 85/67 -- -- 62 20 97 % --  07/18/21 1730 (!) 87/66 -- -- 61 20 98 % --  07/18/21 1715 96/71 -- -- 60 (!) 21 99 % --  07/18/21 1700 110/77 -- -- 68 (!) 21 100 % --  07/18/21 1645 99/74 -- -- 62 (!) 21 97 % --   Chest b/l air entry- crepts Hs irregular Abd soft Edema extremities Left arm AV fistula resected- site covered with dressing CNS non focal Left femoral HD cath   Labs CBC Latest Ref Rng & Units 07/19/2021 07/18/2021 07/17/2021  WBC 4.0 - 10.5 K/uL 8.5 6.5 7.0  Hemoglobin 13.0 - 17.0 g/dL 8.8(L) 8.2(L) 7.6(L)  Hematocrit 39.0 - 52.0 % 26.2(L) 25.8(L) 24.0(L)  Platelets 150 - 400 K/uL 145(L) 120(L) 121(L)    CMP Latest Ref Rng & Units 07/19/2021 07/18/2021 07/17/2021  Glucose 70 - 99 mg/dL 101(H) 100(H) 95  BUN 6 - 20 mg/dL 43(H) 67(H) 64(H)  Creatinine 0.61 - 1.24 mg/dL 5.81(H) 8.28(H) 7.94(H)  Sodium 135 - 145 mmol/L 134(L) 138 137  Potassium 3.5 - 5.1 mmol/L 4.0 4.7 4.3  Chloride 98 - 111 mmol/L 96(L) 98 98  CO2 22 - 32 mmol/L 27 22 22   Calcium 8.9 - 10.3 mg/dL 8.1(L) 8.4(L) 8.3(L)  Total Protein 6.5 - 8.1 g/dL - - 6.2(L)  Total Bilirubin 0.3 - 1.2 mg/dL - - 1.3(H)  Alkaline Phos 38 - 126 U/L - - 102  AST 15 - 41 U/L - - 13(L)  ALT 0 - 44 U/L - - 10     Micro 10/24 BC- serratia 10/26 wound  serratia 10/26 BC NG 07/19/21 BC- NG   Impression/recommendation Serratia bacteremia due to infected AV fistula which has been resected Repeat blood culture neg, pt on cefepime 2 d echo done - result pending May need TEE  ESRD on dialysis Has a left femoral cath which was placed when he was bacteremic- need toremove the line when stable and after 48 hrs of drug holiday will have to place another line  Anemia- had some blood loss from the infected fistula  Acute hypoxic resp failure post surgery- was intubated and has bene extubated today  ID will follow him peripherally this weekend- call if needed

## 2021-07-19 NOTE — Progress Notes (Signed)
[facility]  ROUNDING NOTE   Subjective:   [name] is a 55 year old male with end stage renal disease on hemodialysis, alcohol abuse, atrial fibrillation, COPD.   Patient left AMA on 10/25. Now returns with worsening left AVF infection. Patient underwent left AVF removal by Dr. Delana Meyer on 10/26. Remains intubated. Weaned off vasopressors.  Hemodialysis yesterday. No ultrafiltration.   Patient receives outpatient dialysis at [facility] supervised by [facility]) Dr. Posey Pronto.   Objective:  Vital signs in last 24 hours:  Temp:  [97.2 F (36.2 C)-97.9 F (36.6 C)] 97.9 F (36.6 C) (10/28 0745) Pulse Rate:  [51-68] 59 (10/28 0700) Resp:  [12-22] 20 (10/28 0700) BP: (85-140)/(66-81) 111/74 (10/28 0700) SpO2:  [97 %-100 %] 98 % (10/28 0700) FiO2 (%):  [30 %-40 %] 40 % (10/28 0745) Weight:  [77 kg-77.1 kg] 77 kg (10/27 1823)  Weight change: 2.2 kg Filed Weights   07/17/21 1855 07/18/21 1443 07/18/21 1823  Weight: 74.7 kg 77.1 kg 77 kg     Intake/Output: I/O last 3 completed shifts: In: 862.4 [I.V.:762.4; IV Piggyback:100] Out: -72    Intake/Output this shift:  No intake/output data recorded.  Physical Exam: General: Critically ill  Head: ETT  Eyes: Anicteric  Lungs:  PRVC FiO2 40%  Heart: bradycardia  Abdomen:  Soft, nontender, distended  Extremities:  no peripheral edema.  Neurologic: Intubated and sedated.   Skin: Left arm dressings  Access: Left femoral temp HD catheter 10/26 Dr. Delana Meyer    Basic Metabolic Panel: Recent Labs  Lab 07/12/21 1851 07/13/21 0155 07/13/21 1317 07/15/21 1250 07/15/21 1503 07/17/21 0110 07/17/21 2353 07/18/21 0423 07/19/21 0430  NA 142   < > 144   < > 140 135 137 138 134*  K 4.9   < > 5.6*   < > 4.1 3.5 4.3 4.7 4.0  CL 102   < > 101   < > 101 97* 98 98 96*  CO2 20*   < > 23   < > 20* _0 GLUCOSE 149*   < > 105*   < > 71 101* 95 100* 101*  BUN 110*   < > 133*   < >  88* 57* 64* 67* 43*  CREATININE 14.11*   < > 14.84*   < > 10.25* 7.40* 7.94* 8.28* 5.81*  CALCIUM 9.1   < > 8.9   < > 8.6* 8.3* 8.3* 8.4* 8.1*  MG 2.6*  --   --   --   --  1.8  --  2.0 1.7  PHOS  --   --  9.6*  --  6.6* 5.2*  --  8.5* 6.7*   < > = values in this interval not displayed.     Liver Function Tests: Recent Labs  Lab 07/12/21 1851 07/13/21 0311 07/13/21 1317 07/15/21 1503 07/17/21 0110 07/17/21 2353  AST 16 14*  --   --  16 13*  ALT 16 14  --   --  12 10  ALKPHOS 108 106  --   --  117 102  BILITOT 1.5* 1.1  --   --  0.9 1.3*  PROT 8.0 6.6  --   --  6.6 6.2*  ALBUMIN 3.2* 2.8* 2.7* 2.7* 2.6* 2.4*    Recent Labs  Lab 07/12/21 1851  LIPASE 34    Recent Labs  Lab 07/17/21 2353  AMMONIA <10     CBC: Recent Labs  Lab 07/12/21  1851 07/13/21 0311 07/15/21 1928 07/17/21 0110 07/17/21 1232 07/17/21 2353 07/18/21 0423 07/19/21 0430  WBC 13.9*   < > 16.8* 9.1  --  7.0 6.5 8.5  NEUTROABS 12.3*  --   --  7.5  --   --   --   --   HGB 8.2*   < > 8.4* 6.5* 7.3* 7.6* 8.2* 8.8*  HCT 25.7*   < > 26.8* 20.2* 22.5* 24.0* 25.8* 26.2*  MCV 98.1   < > 100.8* 94.8  --  92.7 94.2 91.9  PLT 141*   < > 133* 130*  --  121* 120* 145*   < > = values in this interval not displayed.     Cardiac Enzymes: No results for input(s): CKTOTAL, CKMB, CKMBINDEX, TROPONINI in the last 168 hours.  BNP: Invalid input(s): POCBNP  CBG: Recent Labs  Lab 07/13/21 0144 07/17/21 2014  GLUCAP 91 80     Microbiology: Results for orders placed or performed during the hospital encounter of 07/16/21  Blood Culture (routine x 2)     Status: None (Preliminary result)   Collection Time: 07/17/21  1:10 AM   Specimen: BLOOD  Result Value Ref Range Status   Specimen Description BLOOD RIGHT HAND  Final   Special Requests   Final    BOTTLES DRAWN AEROBIC AND ANAEROBIC Blood Culture results may not be optimal due to an excessive volume of blood received in culture bottles   Culture    Final    NO GROWTH 2 DAYS Performed at [facility], [address] [place], [place] [address]    Report Status PENDING  Incomplete  Blood Culture (routine x 2)     Status: None (Preliminary result)   Collection Time: 07/17/21  1:10 AM   Specimen: BLOOD  Result Value Ref Range Status   Specimen Description BLOOD RIGHT ASSIST CONTROL  Final   Special Requests   Final    BOTTLES DRAWN AEROBIC AND ANAEROBIC Blood Culture adequate volume   Culture   Final    NO GROWTH 2 DAYS Performed at [facility], [address], [place], [place] [address]    Report Status PENDING  Incomplete  Resp Panel by RT-PCR (Flu A&B, Covid) Nasopharyngeal Swab     Status: None   Collection Time: 07/17/21  1:10 AM   Specimen: Nasopharyngeal Swab; Nasopharyngeal(NP) swabs in vial transport medium  Result Value Ref Range Status   SARS Coronavirus 2 by RT PCR NEGATIVE NEGATIVE Final    Comment: (NOTE) SARS-CoV-2 target nucleic acids are NOT DETECTED.  The SARS-CoV-2 RNA is generally detectable in upper respiratory specimens during the acute phase of infection. The lowest concentration of SARS-CoV-2 viral copies this assay can detect is 138 copies/mL. A negative result does not preclude SARS-Cov-2 infection and should not be used as the sole basis for treatment or other patient management decisions. A negative result may occur with  improper specimen collection/handling, submission of specimen other than nasopharyngeal swab, presence of viral mutation(s) within the areas targeted by this assay, and inadequate number of viral copies(<138 copies/mL). A negative result must be combined with clinical observations, patient history, and epidemiological information. The expected result is Negative.  Fact Sheet for Patients:  EntrepreneurPulse.com.au  Fact Sheet for Healthcare Providers:  IncredibleEmployment.be  This test is no t yet approved or cleared by  the [place] FDA and  has been authorized for detection and/or diagnosis of SARS-CoV-2 by FDA under an Emergency Use Authorization (EUA). This  EUA will remain  in effect (meaning this test can be used) for the duration of the COVID-19 declaration under Section 564(b)(1) of the Act, 21 U.S.C.section 360bbb-3(b)(1), unless the authorization is terminated  or revoked sooner.       Influenza A by PCR NEGATIVE NEGATIVE Final   Influenza B by PCR NEGATIVE NEGATIVE Final    Comment: (NOTE) The Xpert Xpress SARS-CoV-2/FLU/RSV plus assay is intended as an aid in the diagnosis of influenza from Nasopharyngeal swab specimens and should not be used as a sole basis for treatment. Nasal washings and aspirates are unacceptable for Xpert Xpress SARS-CoV-2/FLU/RSV testing.  Fact Sheet for Patients: EntrepreneurPulse.com.au  Fact Sheet for Healthcare Providers: IncredibleEmployment.be  This test is not yet approved or cleared by the [place] FDA and has been authorized for detection and/or diagnosis of SARS-CoV-2 by FDA under an Emergency Use Authorization (EUA). This EUA will remain in effect (meaning this test can be used) for the duration of the COVID-19 declaration under Section 564(b)(1) of the Act, 21 U.S.C. section 360bbb-3(b)(1), unless the authorization is terminated or revoked.  Performed at [facility], [address], [place], [place] [address]   Aerobic/Anaerobic Culture w Gram Stain (surgical/deep wound)     Status: None (Preliminary result)   Collection Time: 07/17/21  1:10 AM   Specimen: Left Antecubital; Wound  Result Value Ref Range Status   Specimen Description   Final    LEFT ANTECUBITAL Performed at [facility], [address], [place], [place] [address]    Special Requests   Final    NONE Performed at [facility], [place], [place] [address]    Gram Stain   Final    NO  SQUAMOUS EPITHELIAL CELLS SEEN MODERATE WBC SEEN RARE GRAM NEGATIVE RODS Performed at [facility], [place] [address], [place], [place] [address]    Culture MODERATE SERRATIA MARCESCENS  Final   Report Status PENDING  Incomplete  Aerobic/Anaerobic Culture w Gram Stain (surgical/deep wound)     Status: None (Preliminary result)   Collection Time: 07/17/21  5:30 PM   Specimen: PATH Other; Tissue  Result Value Ref Range Status   Specimen Description   Final    WOUND Performed at [facility], [address] [place], [place] [address]    Special Requests   Final    LEFT UPPER ARM Performed at [facility], [address] [place], [place] [address]    Gram Stain   Final    RARE WBC PRESENT, PREDOMINANTLY PMN NO ORGANISMS SEEN Performed at [facility], [place] [address] [place], [place] [address]    Culture PENDING  Incomplete   Report Status PENDING  Incomplete  Aerobic/Anaerobic Culture w Gram Stain (surgical/deep wound)     Status: None (Preliminary result)   Collection Time: 07/17/21  5:50 PM   Specimen: PATH Other; Tissue  Result Value Ref Range Status   Specimen Description   Final    WOUND Performed at [facility], [address] [place], [place] [address]    Special Requests   Final    LEFT UPPER ARM Performed at [facility], [address] [place],  [address]    Gram Stain   Final    FEW WBC PRESENT, PREDOMINANTLY PMN FEW GRAM NEGATIVE RODS    Culture   Final    MODERATE GRAM NEGATIVE RODS CULTURE REINCUBATED FOR BETTER GROWTH Performed at [facility], [address] [place], [place] [address]    Report Status PENDING  Incomplete    Coagulation Studies: Recent Labs    07/17/21 0110  LABPROT 17.1*  INR 1.4*     Urinalysis: No results for input(s): COLORURINE, LABSPEC, PHURINE, GLUCOSEU, HGBUR, BILIRUBINUR, KETONESUR, PROTEINUR, UROBILINOGEN, NITRITE, LEUKOCYTESUR in the last 72  hours.  Invalid input(s): APPERANCEUR    Imaging: DG Chest Port 1 View  Result Date: 07/17/2021 CLINICAL DATA:  Respiratory failure EXAM: PORTABLE CHEST 1 VIEW COMPARISON:  Chest radiograph dated 07/17/2021. FINDINGS: Endotracheal tube with tip approximately 2.5 cm above the carina. There is cardiomegaly with vascular congestion. No focal consolidation, pleural effusion or pneumothorax. Atherosclerotic calcification of the aorta. No acute osseous pathology. IMPRESSION: Cardiomegaly with vascular congestion. No focal consolidation. Electronically Signed   By: Anner Crete M.D.   On: 07/17/2021 20:47     Medications:    sodium chloride     sodium chloride     ceFEPime (MAXIPIME) IV Stopped (07/18/21 1936)   dexmedetomidine (PRECEDEX) IV infusion 0.8 mcg/kg/hr (07/19/21 0543)   fentaNYL infusion INTRAVENOUS 100 mcg/hr (07/19/21 0543)   norepinephrine (LEVOPHED) Adult infusion Stopped (07/18/21 1822)      chlorhexidine gluconate (MEDLINE KIT)  15 mL Mouth Rinse BID   Chlorhexidine Gluconate Cloth  6 each Topical Q0600   docusate  100 mg Per Tube BID   ipratropium-albuterol  3 mL Nebulization Q6H   mouth rinse  15 mL Mouth Rinse 10 times per day   polyethylene glycol  17 g Per Tube Daily      Assessment/ Plan:  [name] is a 55 y.o. black male with end stage renal disease on hemodialysis, hypertension, COPD, EtOH abuse, atrial fibrillation, congestive heart failure who is admitted to [facility] on 07/16/2021 for Bacteremia [R78.81] ESRD (end stage renal disease) ([place]) [N18.6] Wound infection [T14.8XXA, L08.9] ESRD on hemodialysis ([place]) [N18.6, Z99.2] Acute on chronic anemia [D64.9] ABLA (acute blood loss anemia) [D62]    Kidney MWF [facility] [address] Left AVF 67kg   End Stage Renal Disease on dialysis with fluid volume overload. With complication of dialysis device. Status post removal of infected left arm AVF on 10/26 by Dr. Delana Meyer. Dialysis scheduled for  today with femoral dialysis catheter. Orders prepared. Patient will need tunneled catheter when more stable.   Hypotension: secondary to blood loss, bacteremia and hypotension. Weaned off norepinephrine. Holding metoprolol due to bradycardia.   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 Long term plan for sodium thiosulfate.   Anemia with chronic kidney disease: with acute blood loss anemia. Hemoglobin 8.8. Status post PRBC transfusion on 10/26. Initiate EPO with MWF HD treatments.   5.  Sepsis with bacteremia: blood cultures on 10/24 with serratia species. Source seems to be AVF with superimposed cellulitis. Now removed and placed on cefepime. Appreciate ID, vascular, and critical care input.    LOS: 2 Vishnu Moeller 10/28/20228:19 AM

## 2021-07-19 NOTE — Final Progress Note (Signed)
Accepted from PCCM.  TRH will assume attending role on 10/29 at 7 AM.

## 2021-07-19 NOTE — Progress Notes (Addendum)
NAME:  Jordan [name], MRN:  [id number], DOB:  Oct 26, 1965, LOS: 2 ADMISSION DATE:  07/16/2021, CONSULTATION DATE:  07/17/2021 REFERRING MD:  Darrin Nipper, MD CHIEF COMPLAINT:  Respiratory Failure   History of Present Illness:  Jordan [name] is a 55 year old male, daily smoker with history of end-stage renal disease on intermittent hemodialysis, hypertension alcohol abuse, and COPD who was admitted 10/25 due to bleeding from his AV fistula.  He received dialysis on 10/25 prior to admission. He was previously admitted 10/21 for volume overload with pulmonary edema requiring mechanical intubation due to missed dialysis session. He underwent fistulogram on 10/24 and found to have thrombosed AV fistula. He was started on IV heparin  and signed out AMA later on 10/24. Patient's blood cultures on from 10/24 shoed enterobacterales and serratia. ID is following and patient has been treated with cefepime. He was taken to the OR 10/26 by Vascular Surgery for removal of infected left brachial basilic AV fistula and insertion of trialysis catheter of the left femoral vein. He was not successfully extubated after the procedure and was re-intubated and transferred to the ICU.   PCCM has been consulted for further management.  Pertinent  Medical History   Past Medical History:  Diagnosis Date   Anemia    Aortic atherosclerosis ([place]) 11/12/2019   ED (erectile dysfunction)    Emphysema of lung ([place]) 11/12/2019   ESRD on hemodialysis ([place])    M/W/F in [place], [place]   ETOH abuse    History of blood transfusion    Hypertension    Wears dentures    Significant Hospital Events: Including procedures, antibiotic start and stop dates in addition to other pertinent events   10/25 admitted 10/26 s/p removal of infected left brachial basilic AV fistula and insertion of trialysis catheter of the left femoral vein. Admitted to ICU post-operatively. 10/27 remained intubated  Interim History / Subjective:  Off  Levophed. RASS -1 this morning on fentanyl, Precedex. Ventilator weaning ongoing after failed SBT yesterday. On cefepime for Serratia bacteremia. Awaiting TTE.  Objective   Blood pressure 111/74, pulse (!) 59, temperature 97.9 F (36.6 C), temperature source Oral, resp. rate 20, height 5\' 4"  (1.626 m), weight 77 kg, SpO2 98 %.    Vent Mode: PRVC FiO2 (%):  [30 %-40 %] 40 % Set Rate:  [20 bmp] 20 bmp Vt Set:  [450 mL] 450 mL PEEP:  [5 cmH20-10 cmH20] 10 cmH20 Plateau Pressure:  [19 cmH20-21 cmH20] 21 cmH20   Intake/Output Summary (Last 24 hours) at 07/19/2021 0755 Last data filed at 07/19/2021 0543 Gross per 24 hour  Intake 668.27 ml  Output -72 ml  Net 740.27 ml   Filed Weights   07/17/21 1855 07/18/21 1443 07/18/21 1823  Weight: 74.7 kg 77.1 kg 77 kg    Examination: General: intubated, sedated HENT: [place]/AT, PERRL, sclera anicteric, tracking Lungs: mech breath sounds bl, no wheezing or rhonchi Cardiovascular: RRR, 3/6 systolic murmur at apex Abdomen: soft, non-distended, +BS Extremities: warm, trace edema Neuro: wakes to voice, follows commands  Labs reviewed   CBC stable BMP stable NH3 36  Serratia from AV fistula  Resolved Hospital Problem list   Encephalopathy  Assessment & Plan:  Acute Hypoxemic and Hypercapnic Respiratory Failure in post-op setting - continue mechanical ventilatory support - repeat VBG this AM - PAD pathway ordered, RASS goal of 0, wean fentanyl and precedex - SAT/SBT today; extubate if able  Septic Shock Bacteremia due to Serratia marcescens and enterobacterales - Cefepime  per ID recs; spp sensitive to CTX, cipro -- will defer deescalation to ID - f/u repeat BCx - Follow up culture of AV fistula thrombectomy - Awaiting TTE to r/o endocarditis - levo weaned off  AV Fistula Thrombectomy ESRD on HD S/p removal of fistula by vascular surgery 10/26 - further recs/management per vascular surgery - nephrology following for dialysis  needs - will need PermCath placement; discussing with Vacular on timing  Acute Blood Loss Anemia Pseduoaneurysm of left brachiocephalic AV Fistula S/p 2 units PRBCs 10/26 with post-transfusion hemoglobin 7.3g/dL - monitor H/H - transfuse for hemoglobin less than 7g/dL  Chronic Diastolic Heart Failure - will monitor for volume overload  COPD - not acutely exacerbated - add scheduled nebs  Best Practice (right click and "Reselect all SmartList Selections" daily)   Diet/type: NPO, start tube feeds if not extubating DVT prophylaxis: prophylactic heparin  GI prophylaxis: PPI Lines: Dialysis Catheter Foley:  N/A Code Status:  full code Last date of multidisciplinary goals of care discussion [n/a]  Critical care time: 35 minutes    Bennie Pierini, MD 07/19/21 7:55 AM    See Shea Evans for personal pager PCCM on call pager [phone] until 7pm. Please call Elink 7p-7a. [phone]

## 2021-07-19 NOTE — Procedures (Signed)
Extubation Procedure Note  Patient Details:   Name: AVIEN TAHA DOB: 02-18-1966 MRN: [id number]   Airway Documentation:    Vent end date: 07/19/21 Vent end time: 1510   Evaluation  O2 sats: transiently fell during during procedure Complications: No apparent complications Patient did tolerate procedure well. Bilateral Breath Sounds: Clear   Yes.  Extubated to room air, but desaturated.  Spo2 stable on 4lpm [place].  Able to cough and speak.  Cuff leak present prior to extuabtion.  Rayne Du Jen Eppinger 07/19/2021, 3:51 PM

## 2021-07-19 NOTE — Progress Notes (Signed)
[facility] Daily Progress Note  07/17/21: Removal of infected left brachial basilic AV fistula. Insertion of trialysis catheter left femoral vein with ultrasound guidance  Subjective: Sedated and vented.  Objective: Vitals:   07/19/21 0745 07/19/21 0827 07/19/21 0855 07/19/21 0939  BP:      Pulse:      Resp:      Temp: 97.9 F (36.6 C)   97.9 F (36.6 C)  TempSrc: Oral   Axillary  SpO2:  95%    Weight:   77.4 kg   Height:        Intake/Output Summary (Last 24 hours) at 07/19/2021 0947 Last data filed at 07/19/2021 0543 Gross per 24 hour  Intake 668.27 ml  Output -72 ml  Net 740.27 ml   Physical Exam: Vented and sedated CV: RRR Pulmonary: Decreased bilaterally bilaterally Abdomen: Soft, Nontender, Nondistended Vascular:  Left upper extremity: Dressing with small amount of drainage.  Extremity is soft but edematous.  Hand is warm.   Laboratory: CBC    Component Value Date/Time   WBC 8.5 07/19/2021 0430   HGB 8.8 (L) 07/19/2021 0430   HCT 26.2 (L) 07/19/2021 0430   PLT 145 (L) 07/19/2021 0430   BMET    Component Value Date/Time   NA 134 (L) 07/19/2021 0430   K 4.0 07/19/2021 0430   CL 96 (L) 07/19/2021 0430   CO2 27 07/19/2021 0430   GLUCOSE 101 (H) 07/19/2021 0430   BUN 43 (H) 07/19/2021 0430   CREATININE 5.81 (H) 07/19/2021 0430   CALCIUM 8.1 (L) 07/19/2021 0430   GFRNONAA 11 (L) 07/19/2021 0430   GFRAA 4 (L) 06/20/2020 0414   Assessment/Planning: The patient is a 55 year old male who presented with an infected dialysis access status post excision - POD#2  1) vented and sedated 2) excision site with minimal drainage on dressing. 3) hemoglobin stable 4) care as per primary team  Discussed with Dr. Eber Hong [facility] PA-C 07/19/2021 9:47 AM

## 2021-07-20 ENCOUNTER — Inpatient Hospital Stay: Payer: Medicare Other

## 2021-07-20 ENCOUNTER — Inpatient Hospital Stay (HOSPITAL_COMMUNITY)
Admit: 2021-07-20 | Discharge: 2021-07-20 | Disposition: A | Discharge status: Home / Self Care | Payer: Medicare Other | Attending: Infectious Diseases | Admitting: Infectious Diseases

## 2021-07-20 DIAGNOSIS — R7881 Bacteremia: Secondary | ICD-10-CM | POA: Diagnosis not present

## 2021-07-20 DIAGNOSIS — D62 Acute posthemorrhagic anemia: Secondary | ICD-10-CM | POA: Diagnosis not present

## 2021-07-20 LAB — ECHOCARDIOGRAM COMPLETE
AR max vel: 1.91 cm2
AV Peak grad: 8.5 mmHg
Ao pk vel: 1.46 m/s
Area-P 1/2: 5.54 cm2
Calc EF: 38 %
Height: 64 in
S' Lateral: 3.7 cm
Single Plane A2C EF: 39 %
Single Plane A4C EF: 41.7 %
Weight: 2691.38 oz

## 2021-07-20 LAB — BASIC METABOLIC PANEL
Anion gap: 13 (ref 5–15)
BUN: 32 mg/dL — ABNORMAL HIGH (ref 6–20)
CO2: 27 mmol/L (ref 22–32)
Calcium: 7.7 mg/dL — ABNORMAL LOW (ref 8.9–10.3)
Chloride: 94 mmol/L — ABNORMAL LOW (ref 98–111)
Creatinine, Ser: 4.81 mg/dL — ABNORMAL HIGH (ref 0.61–1.24)
GFR, Estimated: 14 mL/min — ABNORMAL LOW (ref >60)
Glucose, Bld: 105 mg/dL — ABNORMAL HIGH (ref 70–99)
Potassium: 3 mmol/L — ABNORMAL LOW (ref 3.5–5.1)
Sodium: 134 mmol/L — ABNORMAL LOW (ref 135–145)

## 2021-07-20 LAB — CBC
HCT: 23.5 % — ABNORMAL LOW (ref 39.0–52.0)
Hemoglobin: 7.8 g/dL — ABNORMAL LOW (ref 13.0–17.0)
MCH: 30.7 pg (ref 26.0–34.0)
MCHC: 33.2 g/dL (ref 30.0–36.0)
MCV: 92.5 fL (ref 80.0–100.0)
Platelets: 148 10*3/uL — ABNORMAL LOW (ref 150–400)
RBC: 2.54 MIL/uL — ABNORMAL LOW (ref 4.22–5.81)
RDW: 16.7 % — ABNORMAL HIGH (ref 11.5–15.5)
WBC: 9.2 10*3/uL (ref 4.0–10.5)
nRBC: 0 % (ref 0.0–0.2)

## 2021-07-20 LAB — PHOSPHORUS: Phosphorus: 4.3 mg/dL (ref 2.5–4.6)

## 2021-07-20 LAB — MAGNESIUM: Magnesium: 1.6 mg/dL — ABNORMAL LOW (ref 1.7–2.4)

## 2021-07-20 IMAGING — DX DG CHEST 1V PORT
1 series · 2 of 2 positions shown · non-contrast
Comparison: Radiograph [DATE].  CT [DATE]

CLINICAL DATA: Central line placement.

EXAM:
PORTABLE CHEST 1 VIEW

[Series 1: chest ap · 0.14mm/px · 2 of 2 slices shown]
[im 1/2]
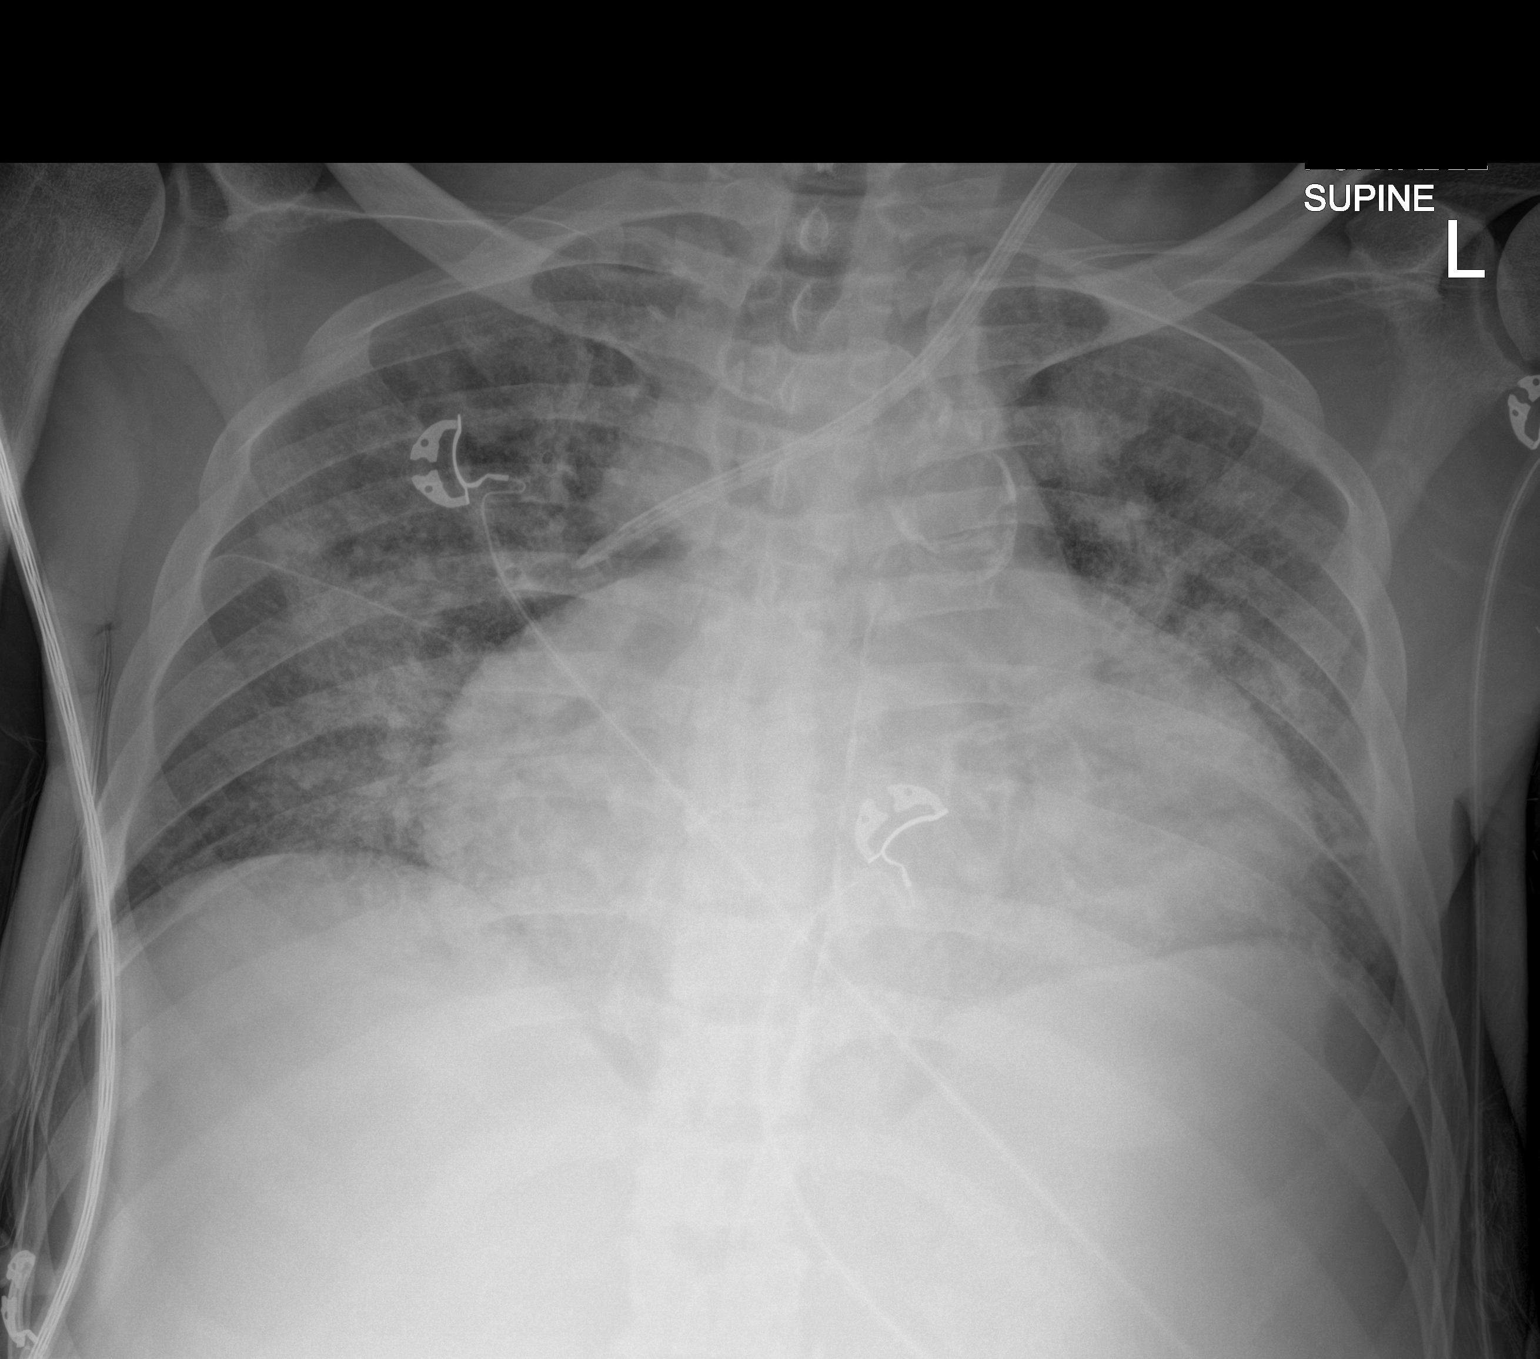
[im 2/2]
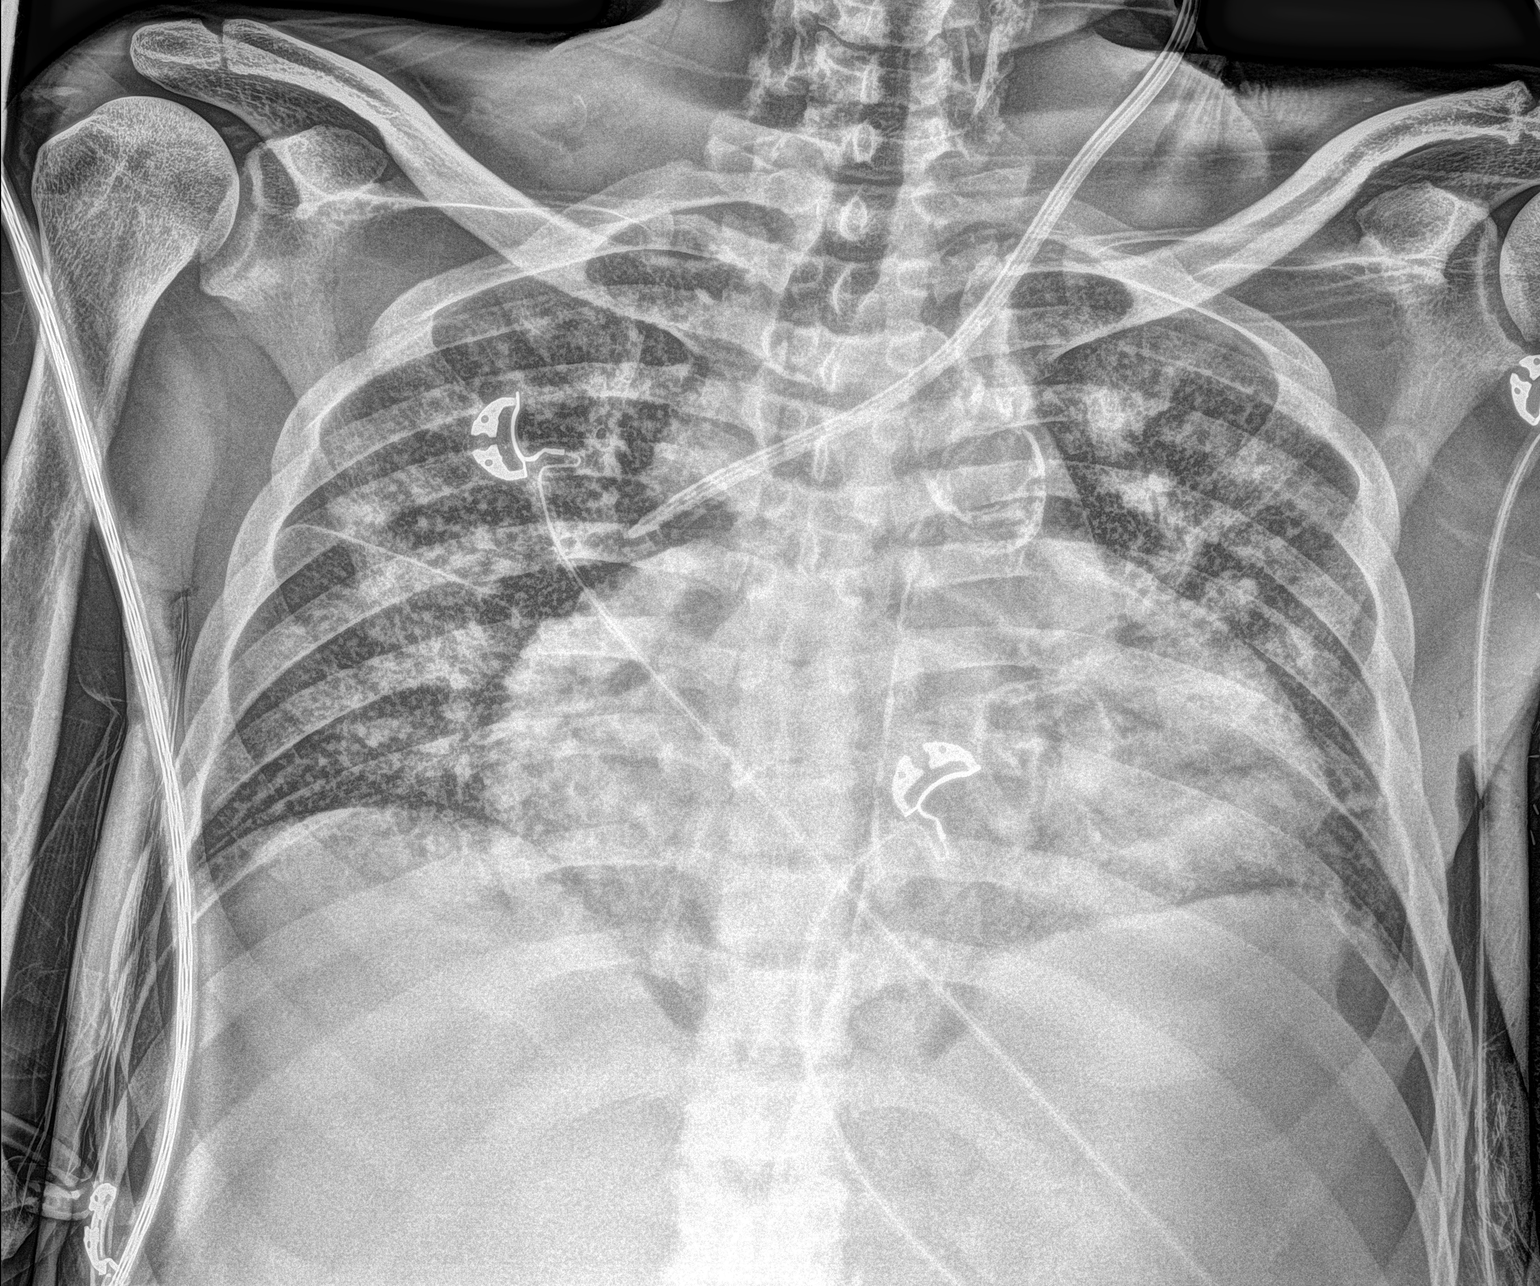

[2 of 2 positions shown; findings below may reference images not displayed]

FINDINGS: Interval extubation. Left internal jugular central venous catheter
tip projects over the upper SVC. Unchanged enlarged
cardiopericardial silhouette. Advanced aortic atherosclerosis.
Development of pulmonary edema from prior exam. There may be trace
left pleural effusion. No pneumothorax.
IMPRESSION: 1. Left internal jugular central venous catheter with tip over the
upper SVC. No pneumothorax.
2. Unchanged cardiomegaly/enlargement of the cardiopericardial
silhouette. Development of pulmonary edema from prior exam.

## 2021-07-20 MED ORDER — MIDAZOLAM HCL 2 MG/2ML IJ SOLN
2.0000 mg | Freq: Once | INTRAMUSCULAR | Status: AC
  Administered 2021-07-20: 2 mg via INTRAVENOUS
  Filled 2021-07-20: qty 2

## 2021-07-20 MED ORDER — MIDAZOLAM HCL 2 MG/2ML IJ SOLN
1.0000 mg | Freq: Once | INTRAMUSCULAR | Status: AC
  Administered 2021-07-20: 1 mg via INTRAVENOUS

## 2021-07-20 MED ORDER — POTASSIUM CHLORIDE CRYS ER 20 MEQ PO TBCR
40.0000 meq | EXTENDED_RELEASE_TABLET | Freq: Four times a day (QID) | ORAL | Status: AC
  Administered 2021-07-20 (×2): 40 meq via ORAL
  Filled 2021-07-20 (×2): qty 2

## 2021-07-20 MED ORDER — TRIAMCINOLONE ACETONIDE 0.1 % EX CREA
TOPICAL_CREAM | Freq: Two times a day (BID) | CUTANEOUS | Status: DC
  Filled 2021-07-20 (×4): qty 15

## 2021-07-20 MED ORDER — MAGNESIUM OXIDE -MG SUPPLEMENT 400 (240 MG) MG PO TABS
400.0000 mg | ORAL_TABLET | Freq: Two times a day (BID) | ORAL | Status: DC
  Administered 2021-07-20 – 2021-07-21 (×4): 400 mg via ORAL
  Filled 2021-07-20 (×5): qty 1

## 2021-07-20 MED ORDER — DIPHENHYDRAMINE HCL 25 MG PO CAPS
50.0000 mg | ORAL_CAPSULE | Freq: Four times a day (QID) | ORAL | Status: DC | PRN
  Administered 2021-07-20 – 2021-07-22 (×7): 50 mg via ORAL
  Filled 2021-07-20 (×10): qty 2

## 2021-07-20 MED ORDER — FENTANYL CITRATE PF 50 MCG/ML IJ SOSY
50.0000 ug | PREFILLED_SYRINGE | Freq: Once | INTRAMUSCULAR | Status: AC
  Administered 2021-07-20: 50 ug via INTRAVENOUS
  Filled 2021-07-20: qty 1

## 2021-07-20 MED ORDER — ALBUMIN HUMAN 25 % IV SOLN
25.0000 g | Freq: Once | INTRAVENOUS | Status: AC
  Administered 2021-07-20: 25 g via INTRAVENOUS
  Filled 2021-07-20: qty 100

## 2021-07-20 MED ORDER — FENTANYL CITRATE PF 50 MCG/ML IJ SOSY
PREFILLED_SYRINGE | INTRAMUSCULAR | Status: AC
  Filled 2021-07-20: qty 1

## 2021-07-20 MED ORDER — MIDAZOLAM HCL 2 MG/2ML IJ SOLN
INTRAMUSCULAR | Status: AC
  Filled 2021-07-20: qty 2

## 2021-07-20 MED ORDER — MELATONIN 5 MG PO TABS
5.0000 mg | ORAL_TABLET | Freq: Every day | ORAL | Status: DC
  Administered 2021-07-21 (×2): 5 mg via ORAL
  Filled 2021-07-20 (×2): qty 1

## 2021-07-20 MED ORDER — FAMOTIDINE 40 MG/5ML PO SUSR
20.0000 mg | Freq: Every day | ORAL | Status: DC
  Administered 2021-07-21: 20 mg via ORAL
  Filled 2021-07-20 (×2): qty 2.5

## 2021-07-20 MED ORDER — HYDROCORTISONE 1 % EX CREA: TOPICAL_CREAM | Freq: Two times a day (BID) | CUTANEOUS | Status: DC

## 2021-07-20 MED ORDER — PERMETHRIN 5 % EX CREA
TOPICAL_CREAM | Freq: Once | CUTANEOUS | Status: AC
  Filled 2021-07-20 (×2): qty 60

## 2021-07-20 NOTE — Progress Notes (Signed)
      Daily Progress Note   Assessment/Planning:   POD #3 s/p L BVT excision, L fem temp dialysis catheter  Dressing changes BID Still pus in wound.  Hold off on tunneled dialysis catheter  More confused today   Subjective  - 3 Days Post-Op   Pt confused, c/o itching in L arm and pain   Objective   Vitals:   07/20/21 0100 07/20/21 0700 07/20/21 0759 07/20/21 0838  BP: (!) 86/63 (!) 132/96    Pulse: (!) 121 (!) 129    Resp: (!) 22 (!) 25 (!) 26   Temp:   98.5 F (36.9 C)   TempSrc:   Oral   SpO2:    98%  Weight:      Height:         Intake/Output Summary (Last 24 hours) at 07/20/2021 0907 Last data filed at 07/19/2021 1838 Gross per 24 hour  Intake 423.04 ml  Output 500 ml  Net -76.96 ml    VASC Open incision in LUA: iodoform removed, some pus still in wound, no ascending cellulitis, forearm somewhat swollen, palpable brachial artery, faintly palpable radial pulse  NEURO Confused, not cooperative    Laboratory   CBC CBC Latest Ref Rng & Units 07/20/2021 07/19/2021 07/18/2021  WBC 4.0 - 10.5 K/uL 9.2 8.5 6.5  Hemoglobin 13.0 - 17.0 g/dL 7.8(L) 8.8(L) 8.2(L)  Hematocrit 39.0 - 52.0 % 23.5(L) 26.2(L) 25.8(L)  Platelets 150 - 400 K/uL 148(L) 145(L) 120(L)    BMET    Component Value Date/Time   NA 134 (L) 07/20/2021 0537   K 3.0 (L) 07/20/2021 0537   CL 94 (L) 07/20/2021 0537   CO2 27 07/20/2021 0537   GLUCOSE 105 (H) 07/20/2021 0537   BUN 32 (H) 07/20/2021 0537   CREATININE 4.81 (H) 07/20/2021 0537   CALCIUM 7.7 (L) 07/20/2021 0537   GFRNONAA 14 (L) 07/20/2021 0537   GFRAA 4 (L) 06/20/2020 0414     Adele Barthel, MD, FACS, FSVS Covering for [facility]: [phone]  07/20/2021, 9:07 AM

## 2021-07-20 NOTE — Procedures (Signed)
Central Venous Catheter Insertion Procedure Note  Jordan [name]  [id number]  1966-05-28  Date:07/20/21  Time:1:50 PM   Provider Performing:Augusto Deckman D Dewaine Conger   Procedure: Insertion of Non-tunneled Central Venous Catheter(36556)with US guidance (07867)    Indication(s) Hemodialysis  Consent Risks of the procedure as well as the alternatives and risks of each were explained to the patient and/or caregiver.  Consent for the procedure was obtained and is signed in the bedside chart  Anesthesia Topical only with 1% lidocaine   Timeout Verified patient identification, verified procedure, site/side was marked, verified correct patient position, special equipment/implants available, medications/allergies/relevant history reviewed, required imaging and test results available.  Sterile Technique Maximal sterile technique including full sterile barrier drape, hand hygiene, sterile gown, sterile gloves, mask, hair covering, sterile ultrasound probe cover (if used).  Procedure Description Area of catheter insertion was cleaned with chlorhexidine and draped in sterile fashion.   With real-time ultrasound guidance a HD catheter was placed into the left internal jugular vein.  Nonpulsatile blood flow and easy flushing noted in all ports.  The catheter was sutured in place and sterile dressing applied.  Complications/Tolerance None; patient tolerated the procedure well. Chest X-ray is ordered to verify placement for internal jugular or subclavian cannulation.  Chest x-ray is not ordered for femoral cannulation.  EBL Minimal  Specimen(s) None   Trialysis Catheter secured at 19 cm mark.    BIOPATCH applied to the insertion site.     Darel Hong, AGACNP-BC [facility] Prefer epic messenger for cross cover needs If after hours, please call E-link

## 2021-07-20 NOTE — Progress Notes (Signed)
*  PRELIMINARY RESULTS* Echocardiogram 2D Echocardiogram has been performed.  Jordan [name] 07/20/2021, 11:42 AM

## 2021-07-20 NOTE — Progress Notes (Signed)
Pt intermittently confused, but pleasant. Pulling off pulse ox, blood pressure cuff due to uncontrolled itching. Patient has been given prn lotion, cream, and PO medication and pt has had no relief of itching. Patient creating scattered skin tears on body from itching. ST on monitor, on 4 LNC.   Surgical dressing reinforced for vascular to do first dressing change Post op.

## 2021-07-20 NOTE — Progress Notes (Signed)
[facility] Progress Note  Patient: Jordan [name]    CHY:[id number]  DOA: 07/16/2021     Date of Service: the patient was seen and examined on 07/20/2021  Chief Complaint  Patient presents with   Vascular Access Problem   Brief hospital course: Jordan [name] is a 55 y.o. male with medical history significant for ESRD on HD MWF, alcohol abuse, COPD, medication and dialysis noncompliance with frequent hospitalizations for fluid overload, who presents to the hospital by EMS due to bleeding from his left arm dialysis fistula for the past 1 hour that has continued and spite of pressure dressings applied by EMS.,  Patient was recently hospitalized on 10/21 for acute pulmonary edema secondary to missing dialysis having to be intubated for severe respiratory and uremic encephalopathy.  He underwent fistulogram on 10/24 and found to have a thrombosed AV fistula and started on a heparin infusion.  He signed out [place] on 10/24 with a femoral catheter in situ.  He was apparently last dialyzed on 10/25 following which the femoral catheter was pulled.  On the same night he returns to the ED due to bleeding from the left AV fistula. He is denying shortness of breath or chest pain, fever or chills. Of note patient did have positive blood cultures from 10/24 that showed Enterobacterales and Serratia marcescens .  He was seen by ID who recommended cefepime and repeating blood cultures until clearance of bacteremia   ED course: Upon arrival, AV graft had stopped bleeding, per ED provider.  Vitals were stable with BP 117/80, pulse 91 O2 sat 100% and temp 98.6 Blood work: Hemoglobin 6.5, down from 8.4 on 10/24 platelets 130 which is above baseline WBC 9.1 with lactic acid 1.5   EKG, personally viewed and interpreted: Sinus at 92 with no acute ST-T wave changes   Chest x-ray: Mild, stable, diffusely increased interstitial lung markings which are likely, in part chronic in nature.  A mild superimposed  component of interstitial edema cannot be excluded   Patient given cefepime and vancomycin in the ED.  Blood cultures done.  Hospitalist consulted for admission.  Patient was seen by vascular surgery, patient was taken to the OR on 10/26 for removal of infected left brachial basilic AV fistula and insertion of dialysis catheter after left femoral vein.  Patient was not successfully extubated after the procedure and he was reintubated and transferred to ICU.   Patient was extubated on 10/28 and transferred under [facility] hospitalist service   Assessment and Plan:   # Respiratory failure s/p intubation after the procedure of AV fistula removal, unsuccessful extubation, patient was reintubated and remained in the ICU, extubated on 10/28, Currently patient is saturating well on room air   # ABLA (acute blood loss anemia)   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 ([place]) - Patient presents with bleeding of left AV fistula with hemoglobin 6.5, down from 8.4 in the setting of recent heparin infusion for thrombosed fistula - Hemostasis achieved after arrival in the ED, patient received 1 unit of PRBC in the ED. Continue monitor H&H and transfuse if hemoglobin less than 7 Vascular surgery consulted, s/p removal of infected AV fistula, left femoral dialysis catheter was inserted for dialysis.  It was removed on 10/28 after dialysis.     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 - Blood culture from 10/21 with Serratia  - Patient was seen by ID on 10/24,  so reconsulted ID for antibiotics and duration of treatment -  Continue cefepime - Follow repeat blood cultures     AV fistula thrombosis, S/p removal of AV fistula, dialysis catheter was inserted left femoral vein which was removed after dialysis on 10/28 - Vascular surgery consulted - Patient had femoral dialysis access that was apparently pulled following dialysis on 10/25      ESRD on dialysis  -  Nephrology consult for continuation of dialysis once access is placed - Vascular consulted for access   Hypokalemia, potassium repleted.  Hypomagnesemia, mag repleted.   Suspect scabies - Patient with multiple excoriation marks on extremities -- Patient was given permethrin topical application x1 dose -  ID consulted - Contact precautions - Antipruritics     Chronic diastolic CHF (congestive heart failure) ([place]) - Patient does not appear acutely overloaded at this time and chest x-ray appears stable - Continue dialysis     COPD (chronic obstructive pulmonary disease) ([place]) - Not acutely exacerbated   Body mass index is 28.34 kg/m.  Interventions:   Pressure Injury 07/14/21 Buttocks Mid Stage 2 -  Partial thickness loss of dermis presenting as a shallow open injury with a red, pink wound bed without slough. open area (Active)  07/14/21 1402  Location: Buttocks  Location Orientation: Mid  Staging: Stage 2 -  Partial thickness loss of dermis presenting as a shallow open injury with a red, pink wound bed without slough.  Wound Description (Comments): open area  Present on Admission: Yes     Diet: NPO for now, resume dialysis diet when procedure is done DVT Prophylaxis: SCD, pharmacological prophylaxis contraindicated due to active bleeding    Advance goals of care discussion: Full code  Family Communication: family was NOT present at bedside, at the time of interview.  The pt provided permission to discuss medical plan with the family. Opportunity was given to ask question and all questions were answered satisfactorily.   Disposition:  Pt is from Home, admitted with bleeding from AV fistula, bacteremia and needs hemodialysis, still has bacteremia, needs IV antibiotics and hemodialysis, which precludes a safe discharge. Discharge to Home, when stable.  Subjective:  No significant overnight events, in the morning time patient was complaining of generalized itching and  patient was insisting to get hemodialysis. Patient was given permethrin topical cream, Kenalog cream and antipruritic medications. Patient denies any other active issues, no chest pain palpitations, no abdominal pain Physical Exam: General:  alert oriented to time, place, and person.  Appear in mild distress, affect appropriate Eyes: PERRLA ENT: Oral Mucosa Clear, moist  Neck: no JVD,  Cardiovascular: S1 and S2 Present, no Murmur,  Respiratory: good respiratory effort, Bilateral Air entry equal and Decreased, mild bibasilar crackles, no significant wheezes Abdomen: Bowel Sound present, Soft and no tenderness,  Skin: Bilateral upper and lower extremity dry scabs, healing well.   Extremities: mild Pedal edema, no calf tenderness Neurologic: without any new focal findings Gait not checked due to patient safety concerns  Vitals:   07/20/21 0838 07/20/21 0900 07/20/21 1000 07/20/21 1427  BP:  111/72 128/74   Pulse:  98 (!) 107   Resp:  (!) 24    Temp:      TempSrc:      SpO2: 98% 90% 96% 95%  Weight:      Height:        Intake/Output Summary (Last 24 hours) at 07/20/2021 1511 Last data filed at 07/20/2021 0800 Gross per 24 hour  Intake 903.04 ml  Output --  Net 903.04 ml   [employer]  07/18/21 1823 07/19/21 0855 07/19/21 1301  Weight: 77 kg 77.4 kg 76.3 kg    Data Reviewed: I have personally reviewed and interpreted daily labs, tele strips, imagings as discussed above. I reviewed all nursing notes, pharmacy notes, vitals, pertinent old records I have discussed plan of care as described above with RN and patient/family.  CBC: Recent Labs  Lab 07/17/21 0110 07/17/21 1232 07/17/21 2353 07/18/21 0423 07/19/21 0430 07/20/21 0537  WBC 9.1  --  7.0 6.5 8.5 9.2  NEUTROABS 7.5  --   --   --   --   --   HGB 6.5* 7.3* 7.6* 8.2* 8.8* 7.8*  HCT 20.2* 22.5* 24.0* 25.8* 26.2* 23.5*  MCV 94.8  --  92.7 94.2 91.9 92.5  PLT 130*  --  121* 120* 145* 638*   Basic Metabolic  Panel: Recent Labs  Lab 07/15/21 1503 07/17/21 0110 07/17/21 2353 07/18/21 0423 07/19/21 0430 07/19/21 1644 07/20/21 0537  NA 140 135 137 138 134*  --  134*  K 4.1 3.5 4.3 4.7 4.0  --  3.0*  CL 101 97* 98 98 96*  --  94*  CO2 20* _0 --  27  GLUCOSE 71 101* 95 100* 101*  --  105*  BUN 88* 57* 64* 67* 43*  --  32*  CREATININE 10.25* 7.40* 7.94* 8.28* 5.81*  --  4.81*  CALCIUM 8.6* 8.3* 8.3* 8.4* 8.1*  --  7.7*  MG  --  1.8  --  2.0 1.7 2.0 1.6*  PHOS 6.6* 5.2*  --  8.5* 6.7*  --  4.3    Studies: ECHOCARDIOGRAM COMPLETE  Result Date: 07/20/2021    ECHOCARDIOGRAM REPORT   Patient Name:   Jordan [name] Date of Exam: 07/20/2021 Medical Rec #:  [id number]        Height:       64.0 in Accession #:    [id number]       Weight:       168.2 lb Date of Birth:  1966/03/11        BSA:          1.818 m Patient Age:    15 years         BP:           117/74 mmHg Patient Gender: M                HR:           59 bpm. Exam Location:  [facility] Procedure: 2D Echo, Cardiac Doppler and Color Doppler Indications:     Bacteremia R78.81  History:         Patient has prior history of Echocardiogram examinations. Risk                  Factors:Hypertension.  Sonographer:     Alyse Low Roar Referring Phys:  [place] Diagnosing Phys: Skeet Latch MD IMPRESSIONS  1. Left ventricular ejection fraction, by estimation, is 45 to 50%. The left ventricle has mildly decreased function. The left ventricle has no regional wall motion abnormalities. There is mild concentric left ventricular hypertrophy. Left ventricular diastolic parameters are consistent with Grade II diastolic dysfunction (pseudonormalization). Elevated left ventricular end-diastolic pressure.  2. Right ventricular systolic function is normal. The right ventricular size is mildly enlarged. There is severely elevated pulmonary artery systolic pressure.  3. Left atrial size was severely dilated.  4. Right atrial size was severely  dilated.  5. There  is no evidence of cardiac tamponade.  6. The mitral valve is degenerative. Mild to moderate mitral valve regurgitation. No evidence of mitral stenosis. Moderate mitral annular calcification.  7. Tricuspid valve regurgitation is severe.  8. The aortic valve is tricuspid. There is mild calcification of the aortic valve. There is mild thickening of the aortic valve. Aortic valve regurgitation is not visualized. No aortic stenosis is present.  9. The inferior vena cava is dilated in size with >50% respiratory variability, suggesting right atrial pressure of 8 mmHg. Comparison(s): Compared with the echo 06/03/21, the pericardial effusion is larger. There is no tamponade. FINDINGS  Left Ventricle: Left ventricular ejection fraction, by estimation, is 45 to 50%. The left ventricle has mildly decreased function. The left ventricle has no regional wall motion abnormalities. The left ventricular internal cavity size was normal in size. There is mild concentric left ventricular hypertrophy. Left ventricular diastolic parameters are consistent with Grade II diastolic dysfunction (pseudonormalization). Elevated left ventricular end-diastolic pressure. Right Ventricle: The right ventricular size is mildly enlarged. No increase in right ventricular wall thickness. Right ventricular systolic function is normal. There is severely elevated pulmonary artery systolic pressure. The tricuspid regurgitant velocity is 4.10 m/s, and with an assumed right atrial pressure of 8 mmHg, the estimated right ventricular systolic pressure is 54.6 mmHg. Left Atrium: Left atrial size was severely dilated. Right Atrium: Right atrial size was severely dilated. Pericardium: Trivial pericardial effusion is present. There is no evidence of cardiac tamponade. Mitral Valve: The mitral valve is degenerative in appearance. There is moderate thickening of the mitral valve leaflet(s). There is moderate calcification of the mitral valve  leaflet(s). Moderate mitral annular calcification. Mild to moderate mitral valve regurgitation. No evidence of mitral valve stenosis. Tricuspid Valve: The tricuspid valve is normal in structure. Tricuspid valve regurgitation is severe. No evidence of tricuspid stenosis. Aortic Valve: The aortic valve is tricuspid. There is mild calcification of the aortic valve. There is mild thickening of the aortic valve. Aortic valve regurgitation is not visualized. No aortic stenosis is present. Aortic valve peak gradient measures 8.5 mmHg. Pulmonic Valve: The pulmonic valve was normal in structure. Pulmonic valve regurgitation is trivial. No evidence of pulmonic stenosis. Aorta: The aortic root is normal in size and structure. Venous: The inferior vena cava is dilated in size with greater than 50% respiratory variability, suggesting right atrial pressure of 8 mmHg. IAS/Shunts: No atrial level shunt detected by color flow Doppler. Additional Comments: There is pleural effusion in the right lateral region.  LEFT VENTRICLE PLAX 2D LVIDd:         4.70 cm      Diastology LVIDs:         3.70 cm      LV e' medial:    5.11 cm/s LV PW:         1.30 cm      LV E/e' medial:  23.9 LV IVS:        1.30 cm      LV e' lateral:   9.25 cm/s LVOT diam:     1.70 cm      LV E/e' lateral: 13.2 LVOT Area:     2.27 cm  LV Volumes (MOD) LV vol d, MOD A2C: 105.0 ml LV vol d, MOD A4C: 113.0 ml LV vol s, MOD A2C: 64.0 ml LV vol s, MOD A4C: 65.9 ml LV SV MOD A2C:     41.0 ml LV SV MOD A4C:     113.0 ml LV SV  MOD BP:      41.7 ml RIGHT VENTRICLE RV Basal diam:  4.70 cm RV Mid diam:    3.70 cm RV S prime:     8.70 cm/s TAPSE (M-mode): 1.0 cm LEFT ATRIUM             Index        RIGHT ATRIUM           Index LA diam:        4.40 cm 2.42 cm/m   RA Area:     24.50 cm LA Vol (A2C):   88.8 ml 48.86 ml/m  RA Volume:   85.70 ml  47.15 ml/m LA Vol (A4C):   60.4 ml 33.23 ml/m LA Biplane Vol: 73.0 ml 40.16 ml/m  AORTIC VALVE                 PULMONIC VALVE AV  Area (Vmax): 1.91 cm     PV Vmax:          0.96 m/s AV Vmax:        146.00 cm/s  PV Peak grad:     3.7 mmHg AV Peak Grad:   8.5 mmHg     PR End Diast Vel: 13.25 msec LVOT Vmax:      123.00 cm/s  RVOT Peak grad:   1 mmHg  AORTA Ao Root diam: 2.90 cm MITRAL VALVE                TRICUSPID VALVE MV Area (PHT): 5.54 cm     TR Peak grad:   67.2 mmHg MV Decel Time: 137 msec     TR Vmax:        410.00 cm/s MV E velocity: 122.00 cm/s MV A velocity: 93.80 cm/s   SHUNTS MV E/A ratio:  1.30         Systemic Diam: 1.70 cm MV A Prime:    6.7 cm/s Skeet Latch MD Electronically signed by Skeet Latch MD Signature Date/Time: 07/20/2021/3:09:33 PM    Final     Scheduled Meds:  chlorhexidine gluconate (MEDLINE KIT)  15 mL Mouth Rinse BID   Chlorhexidine Gluconate Cloth  6 each Topical Q0600   docusate  100 mg Per Tube BID   [START ON 07/21/2021] famotidine  20 mg Oral Daily   ipratropium-albuterol  3 mL Nebulization Q6H   magnesium oxide  400 mg Oral BID   mouth rinse  15 mL Mouth Rinse BID   permethrin   Topical Once   polyethylene glycol  17 g Per Tube Daily   potassium chloride  40 mEq Oral Q6H   triamcinolone cream   Topical BID   Continuous Infusions:  sodium chloride     sodium chloride     ceFEPime (MAXIPIME) IV Stopped (07/19/21 1743)   dexmedetomidine (PRECEDEX) IV infusion Stopped (07/19/21 1508)   fentaNYL infusion INTRAVENOUS Stopped (07/19/21 1508)   PRN Meds: sodium chloride, sodium chloride, acetaminophen **OR** acetaminophen, alteplase, camphor-menthol, diphenhydrAMINE, diphenhydrAMINE-zinc acetate, fentaNYL, heparin, hydrOXYzine, lidocaine (PF), lidocaine-prilocaine, ondansetron **OR** ondansetron (ZOFRAN) IV, oxyCODONE-acetaminophen, pentafluoroprop-tetrafluoroeth  Time spent: 35 minutes  Author: Val Riles. MD [facility] 07/20/2021 3:11 PM  To reach On-call, see care teams to locate the attending and reach out to them via [email or web address]. If 7PM-7AM, please contact  night-coverage If you still have difficulty reaching the attending provider, please page the [facility] (Director on Call) for [facility] on amion for assistance.

## 2021-07-20 NOTE — Progress Notes (Signed)
[facility]  ROUNDING NOTE   Subjective:   [name] is a 55 year old male with end stage renal disease on hemodialysis, alcohol abuse, atrial fibrillation, COPD.   Patient left AMA on 10/25,returned with worsening left AVF infection. Patient underwent left AVF removal by Dr. Delana Meyer on 10/26.   Patient received HD yesterday with UF 500 ml.Left femoral catheter discontinued after HD, allowing catheter free period.  Objective:  Vital signs in last 24 hours:  Temp:  [98.2 F (36.8 C)-99.3 F (37.4 C)] 98.5 F (36.9 C) (10/29 0759) Pulse Rate:  [54-129] 107 (10/29 1000) Resp:  [11-28] 24 (10/29 0900) BP: (82-132)/(52-96) 128/74 (10/29 1000) SpO2:  [65 %-98 %] 96 % (10/29 1000) FiO2 (%):  [30 %] 30 % (10/28 1332) Weight:  [76.3 kg] 76.3 kg (10/28 1301)  Weight change: 0.3 kg Filed Weights   07/18/21 1823 07/19/21 0855 07/19/21 1301  Weight: 77 kg 77.4 kg 76.3 kg     Intake/Output: I/O last 3 completed shifts: In: 1091.3 [I.V.:891.3; IV Piggyback:200] Out: 500 [Other:500]   Intake/Output this shift:  Total I/O In: 480 [P.O.:480] Out: -   Physical Exam: General: Awake, alert,appears uncomfortable with itching  Head: Normocephalic,atraumatic  Lungs:  Diminished at the bases, respirations even,unlabored,on 5L O2  Heart: Tachycardic on the monitor HR 100' to 110  Abdomen:  Soft, nontender, distended  Extremities:  Trace peripheral edema.  Neurologic: Able to answer simple questions, appears forgetful  Skin: Left arm dressings,scratch marks on arms and legs  Access: No HD access    Basic Metabolic Panel: Recent Labs  Lab 07/15/21 1503 07/17/21 0110 07/17/21 2353 07/18/21 0423 07/19/21 0430 07/19/21 1644 07/20/21 0537  NA 140 135 137 138 134*  --  134*  K 4.1 3.5 4.3 4.7 4.0  --  3.0*  CL 101 97* 98 98 96*  --  94*  CO2 20* 26 22 22 27   --  27  GLUCOSE 71 101* 95 100* 101*  --  105*  BUN 88* 57* 64* 67* 43*  --  32*  CREATININE 10.25*  7.40* 7.94* 8.28* 5.81*  --  4.81*  CALCIUM 8.6* 8.3* 8.3* 8.4* 8.1*  --  7.7*  MG  --  1.8  --  2.0 1.7 2.0 1.6*  PHOS 6.6* 5.2*  --  8.5* 6.7*  --  4.3     Liver Function Tests: Recent Labs  Lab 07/13/21 1317 07/15/21 1503 07/17/21 0110 07/17/21 2353  AST  --   --  16 13*  ALT  --   --  12 10  ALKPHOS  --   --  117 102  BILITOT  --   --  0.9 1.3*  PROT  --   --  6.6 6.2*  ALBUMIN 2.7* 2.7* 2.6* 2.4*    No results for input(s): LIPASE, AMYLASE in the last 168 hours.  Recent Labs  Lab 07/17/21 2353  AMMONIA <10     CBC: Recent Labs  Lab 07/17/21 0110 07/17/21 1232 07/17/21 2353 07/18/21 0423 07/19/21 0430 07/20/21 0537  WBC 9.1  --  7.0 6.5 8.5 9.2  NEUTROABS 7.5  --   --   --   --   --   HGB 6.5* 7.3* 7.6* 8.2* 8.8* 7.8*  HCT 20.2* 22.5* 24.0* 25.8* 26.2* 23.5*  MCV 94.8  --  92.7 94.2 91.9 92.5  PLT 130*  --  121* 120* 145* 148*     Cardiac Enzymes: No results for input(s): CKTOTAL, CKMB,  CKMBINDEX, TROPONINI in the last 168 hours.  BNP: Invalid input(s): POCBNP  CBG: Recent Labs  Lab 07/17/21 2014  GLUCAP 81     Microbiology: Results for orders placed or performed during the hospital encounter of 07/16/21  Blood Culture (routine x 2)     Status: None (Preliminary result)   Collection Time: 07/17/21  1:10 AM   Specimen: BLOOD  Result Value Ref Range Status   Specimen Description BLOOD RIGHT HAND  Final   Special Requests   Final    BOTTLES DRAWN AEROBIC AND ANAEROBIC Blood Culture results may not be optimal due to an excessive volume of blood received in culture bottles   Culture   Final    NO GROWTH 3 DAYS Performed at [facility], [address] [place], [place] [address]    Report Status PENDING  Incomplete  Blood Culture (routine x 2)     Status: None (Preliminary result)   Collection Time: 07/17/21  1:10 AM   Specimen: BLOOD  Result Value Ref Range Status   Specimen Description BLOOD RIGHT ASSIST CONTROL  Final    Special Requests   Final    BOTTLES DRAWN AEROBIC AND ANAEROBIC Blood Culture adequate volume   Culture   Final    NO GROWTH 3 DAYS Performed at [name], [address], [place], [place] [address]    Report Status PENDING  Incomplete  Resp Panel by RT-PCR (Flu A&B, Covid) Nasopharyngeal Swab     Status: None   Collection Time: 07/17/21  1:10 AM   Specimen: Nasopharyngeal Swab; Nasopharyngeal(NP) swabs in vial transport medium  Result Value Ref Range Status   SARS Coronavirus 2 by RT PCR NEGATIVE NEGATIVE Final    Comment: (NOTE) SARS-CoV-2 target nucleic acids are NOT DETECTED.  The SARS-CoV-2 RNA is generally detectable in upper respiratory specimens during the acute phase of infection. The lowest concentration of SARS-CoV-2 viral copies this assay can detect is 138 copies/mL. A negative result does not preclude SARS-Cov-2 infection and should not be used as the sole basis for treatment or other patient management decisions. A negative result may occur with  improper specimen collection/handling, submission of specimen other than nasopharyngeal swab, presence of viral mutation(s) within the areas targeted by this assay, and inadequate number of viral copies(<138 copies/mL). A negative result must be combined with clinical observations, patient history, and epidemiological information. The expected result is Negative.  Fact Sheet for Patients:  EntrepreneurPulse.com.au  Fact Sheet for Healthcare Providers:  IncredibleEmployment.be  This test is no t yet approved or cleared by the [place] FDA and  has been authorized for detection and/or diagnosis of SARS-CoV-2 by FDA under an Emergency Use Authorization (EUA). This EUA will remain  in effect (meaning this test can be used) for the duration of the COVID-19 declaration under Section 564(b)(1) of the Act, 21 U.S.C.section 360bbb-3(b)(1), unless the authorization is terminated   or revoked sooner.       Influenza A by PCR NEGATIVE NEGATIVE Final   Influenza B by PCR NEGATIVE NEGATIVE Final    Comment: (NOTE) The Xpert Xpress SARS-CoV-2/FLU/RSV plus assay is intended as an aid in the diagnosis of influenza from Nasopharyngeal swab specimens and should not be used as a sole basis for treatment. Nasal washings and aspirates are unacceptable for Xpert Xpress SARS-CoV-2/FLU/RSV testing.  Fact Sheet for Patients: EntrepreneurPulse.com.au  Fact Sheet for Healthcare Providers: IncredibleEmployment.be  This test is not yet approved or cleared by the [place] and  has been authorized for detection and/or diagnosis of SARS-CoV-2 by FDA under an Emergency Use Authorization (EUA). This EUA will remain in effect (meaning this test can be used) for the duration of the COVID-19 declaration under Section 564(b)(1) of the Act, 21 U.S.C. section 360bbb-3(b)(1), unless the authorization is terminated or revoked.  Performed at [facility], [address], [place], [place] [address]   Aerobic/Anaerobic Culture w Gram Stain (surgical/deep wound)     Status: None (Preliminary result)   Collection Time: 07/17/21  1:10 AM   Specimen: Left Antecubital; Wound  Result Value Ref Range Status   Specimen Description   Final    LEFT ANTECUBITAL Performed at [facility], [address] [place], [place] [address]    Special Requests   Final    NONE Performed at [facility], [place], [place] [address]    Gram Stain   Final    NO SQUAMOUS EPITHELIAL CELLS SEEN MODERATE WBC SEEN RARE GRAM NEGATIVE RODS Performed at [facility], [place] [address] [place], [place] [address]    Culture   Final    MODERATE SERRATIA MARCESCENS NO ANAEROBES ISOLATED; CULTURE IN PROGRESS FOR 5 DAYS    Report Status PENDING  Incomplete   Organism ID, Bacteria SERRATIA MARCESCENS  Final       Susceptibility   Serratia marcescens - MIC*    CEFAZOLIN >=64 RESISTANT Resistant     CEFEPIME <=0.12 SENSITIVE Sensitive     CEFTAZIDIME <=1 SENSITIVE Sensitive     CEFTRIAXONE <=0.25 SENSITIVE Sensitive     CIPROFLOXACIN <=0.25 SENSITIVE Sensitive     GENTAMICIN <=1 SENSITIVE Sensitive     TRIMETH/SULFA <=20 SENSITIVE Sensitive     * MODERATE SERRATIA MARCESCENS  Aerobic/Anaerobic Culture w Gram Stain (surgical/deep wound)     Status: None (Preliminary result)   Collection Time: 07/17/21  5:30 PM   Specimen: PATH Other; Tissue  Result Value Ref Range Status   Specimen Description WOUND  Final   Special Requests LEFT UPPER ARM  Final   Gram Stain   Final    RARE WBC PRESENT, PREDOMINANTLY PMN NO ORGANISMS SEEN    Culture MODERATE SERRATIA MARCESCENS  Final   Report Status PENDING  Incomplete   Organism ID, Bacteria SERRATIA MARCESCENS  Final      Susceptibility   Serratia marcescens - MIC*    CEFAZOLIN >=64 RESISTANT Resistant     CEFEPIME <=0.12 SENSITIVE Sensitive     CEFTAZIDIME <=1 SENSITIVE Sensitive     CEFTRIAXONE <=0.25 SENSITIVE Sensitive     CIPROFLOXACIN <=0.25 SENSITIVE Sensitive     GENTAMICIN <=1 SENSITIVE Sensitive     TRIMETH/SULFA Value in next row Sensitive      <=20 SENSITIVEPerformed at [facility], [address] [place], [place] [address]    * MODERATE SERRATIA MARCESCENS  Aerobic/Anaerobic Culture w Gram Stain (surgical/deep wound)     Status: None (Preliminary result)   Collection Time: 07/17/21  5:50 PM   Specimen: PATH Other; Tissue  Result Value Ref Range Status   Specimen Description WOUND  Final   Special Requests LEFT UPPER ARM  Final   Gram Stain   Final    FEW WBC PRESENT, PREDOMINANTLY PMN FEW GRAM NEGATIVE RODS    Culture   Final    MODERATE SERRATIA MARCESCENS NO ANAEROBES ISOLATED; CULTURE IN PROGRESS FOR 5 DAYS    Report Status PENDING  Incomplete   Organism ID, Bacteria SERRATIA MARCESCENS  Final      Susceptibility    Serratia marcescens - MIC*    CEFAZOLIN >=64 RESISTANT Resistant     CEFEPIME <=0.12 SENSITIVE Sensitive     CEFTAZIDIME <=1 SENSITIVE Sensitive     CEFTRIAXONE <=0.25 SENSITIVE Sensitive     CIPROFLOXACIN <=0.25 SENSITIVE Sensitive     GENTAMICIN <=1 SENSITIVE Sensitive     TRIMETH/SULFA Value in next row Sensitive      <=20 SENSITIVEPerformed at [facility] [address] [place], [place] [address]    * MODERATE SERRATIA MARCESCENS  CULTURE, BLOOD (ROUTINE X 2) w Reflex to ID Panel     Status: None (Preliminary result)   Collection Time: 07/19/21  1:02 PM   Specimen: BLOOD  Result Value Ref Range Status   Specimen Description BLOOD BLOOD RIGHT HAND  Final   Special Requests   Final    BOTTLES DRAWN AEROBIC AND ANAEROBIC Blood Culture adequate volume   Culture   Final    NO GROWTH < 24 HOURS Performed at [facility], [address] [place], [place] [address]    Report Status PENDING  Incomplete  CULTURE, BLOOD (ROUTINE X 2) w Reflex to ID Panel     Status: None (Preliminary result)   Collection Time: 07/19/21  2:11 PM   Specimen: BLOOD  Result Value Ref Range Status   Specimen Description BLOOD BLOOD RIGHT HAND  Final   Special Requests   Final    BOTTLES DRAWN AEROBIC ONLY Blood Culture results may not be optimal due to an inadequate volume of blood received in culture bottles   Culture   Final    NO GROWTH < 24 HOURS Performed at [facility], [address] [place], [place] [address]    Report Status PENDING  Incomplete  CULTURE, BLOOD (ROUTINE X 2) w Reflex to ID Panel     Status: None (Preliminary result)   Collection Time: 07/19/21  7:03 PM   Specimen: BLOOD  Result Value Ref Range Status   Specimen Description BLOOD RFOA  Final   Special Requests   Final    BOTTLES DRAWN AEROBIC AND ANAEROBIC Blood Culture adequate volume   Culture   Final    NO GROWTH < 12 HOURS Performed at [facility], [address] [place],  [place] [address]    Report Status PENDING  Incomplete  CULTURE, BLOOD (ROUTINE X 2) w Reflex to ID Panel     Status: None (Preliminary result)   Collection Time: 07/19/21  7:09 PM   Specimen: BLOOD  Result Value Ref Range Status   Specimen Description BLOOD BLRA  Final   Special Requests   Final    BOTTLES DRAWN AEROBIC AND ANAEROBIC Blood Culture results may not be optimal due to an excessive volume of blood received in culture bottles   Culture   Final    NO GROWTH < 12 HOURS Performed at [facility], [address] [place], [place] [address]    Report Status PENDING  Incomplete    Coagulation Studies: No results for input(s): LABPROT, INR in the last 72 hours.   Urinalysis: No results for input(s): COLORURINE, LABSPEC, PHURINE, GLUCOSEU, HGBUR, BILIRUBINUR, KETONESUR, PROTEINUR, UROBILINOGEN, NITRITE, LEUKOCYTESUR in the last 72 hours.  Invalid input(s): APPERANCEUR    Imaging: No results found.   Medications:    sodium chloride     sodium chloride     ceFEPime (MAXIPIME) IV Stopped (07/19/21 1743)   dexmedetomidine (PRECEDEX) IV  infusion Stopped (07/19/21 1508)   fentaNYL infusion INTRAVENOUS Stopped (07/19/21 1508)      chlorhexidine gluconate (MEDLINE KIT)  15 mL Mouth Rinse BID   Chlorhexidine Gluconate Cloth  6 each Topical Q0600   docusate  100 mg Per Tube BID   famotidine  20 mg Oral BID   ipratropium-albuterol  3 mL Nebulization Q6H   magnesium oxide  400 mg Oral BID   mouth rinse  15 mL Mouth Rinse BID   permethrin   Topical Once   polyethylene glycol  17 g Per Tube Daily   potassium chloride  40 mEq Oral Q6H   triamcinolone cream   Topical BID      Assessment/ Plan:  [name] is a 55 y.o. black male with end stage renal disease on hemodialysis, hypertension, COPD, EtOH abuse, atrial fibrillation, congestive heart failure who is admitted to [facility] on 07/16/2021 for Bacteremia [R78.81] ESRD (end stage renal disease) ([place])  [N18.6] Wound infection [T14.8XXA, L08.9] ESRD on hemodialysis ([place]) [N18.6, Z99.2] Acute on chronic anemia [D64.9] ABLA (acute blood loss anemia) [D62]   [facility] MWF [facility] [address] Left AVF 67kg   #End Stage Renal Disease on dialysis with fluid volume overload. With complication of dialysis device. Status post removal of infected left arm AVF on 10/26 by Dr. Delana Meyer.  HD yesterday with UF of 500 ml  #Hypotension:  BP within acceptable range today  #Secondary Hyperparathyroidism:  Lab Results  Component Value Date   PTH 669 (H) 02/20/2020   CALCIUM 7.7 (L) 07/20/2021   CAION 1.12 (L) 06/02/2021   PHOS 4.3 07/20/2021   Will continue monitoring bone mineral metabolism parameters closely  #Anemia with chronic kidney disease: with acute blood loss anemia. Status post PRBC transfusion on 10/26.  Lab Results  Component Value Date   HGB 7.8 (L) 07/20/2021   Will plan for  EPOGEN with MWF HD treatments.   # Sepsis with bacteremia: blood cultures on 10/24 with serratia species. Source seems to be AVF with superimposed cellulitis.  Allowing a catheter free period currently, ID team following Patient is on Cefepime    LOS: 3 [place] 10/29/202212:15 PM

## 2021-07-21 DIAGNOSIS — D62 Acute posthemorrhagic anemia: Secondary | ICD-10-CM | POA: Diagnosis not present

## 2021-07-21 LAB — BPAM RBC
Blood Product Expiration Date: 202211202359
Blood Product Expiration Date: 202211202359
Blood Product Expiration Date: 202211202359
Blood Product Expiration Date: 202211202359
ISSUE DATE / TIME: 202210261715
ISSUE DATE / TIME: 202210261715
Unit Type and Rh: 9500
Unit Type and Rh: 9500
Unit Type and Rh: 9500
Unit Type and Rh: 9500

## 2021-07-21 LAB — TYPE AND SCREEN
ABO/RH(D): O NEG
Antibody Screen: POSITIVE
Donor AG Type: NEGATIVE
Unit division: 0
Unit division: 0
Unit division: 0
Unit division: 0

## 2021-07-21 LAB — PHOSPHORUS: Phosphorus: 2.9 mg/dL (ref 2.5–4.6)

## 2021-07-21 LAB — BASIC METABOLIC PANEL
Anion gap: 11 (ref 5–15)
BUN: 24 mg/dL — ABNORMAL HIGH (ref 6–20)
CO2: 28 mmol/L (ref 22–32)
Calcium: 8.2 mg/dL — ABNORMAL LOW (ref 8.9–10.3)
Chloride: 97 mmol/L — ABNORMAL LOW (ref 98–111)
Creatinine, Ser: 3.78 mg/dL — ABNORMAL HIGH (ref 0.61–1.24)
GFR, Estimated: 18 mL/min — ABNORMAL LOW (ref >60)
Glucose, Bld: 120 mg/dL — ABNORMAL HIGH (ref 70–99)
Potassium: 3.7 mmol/L (ref 3.5–5.1)
Sodium: 136 mmol/L (ref 135–145)

## 2021-07-21 LAB — CBC
HCT: 20.7 % — ABNORMAL LOW (ref 39.0–52.0)
Hemoglobin: 6.9 g/dL — ABNORMAL LOW (ref 13.0–17.0)
MCH: 30.5 pg (ref 26.0–34.0)
MCHC: 33.3 g/dL (ref 30.0–36.0)
MCV: 91.6 fL (ref 80.0–100.0)
Platelets: 133 10*3/uL — ABNORMAL LOW (ref 150–400)
RBC: 2.26 MIL/uL — ABNORMAL LOW (ref 4.22–5.81)
RDW: 16.6 % — ABNORMAL HIGH (ref 11.5–15.5)
WBC: 8.7 10*3/uL (ref 4.0–10.5)
nRBC: 0 % (ref 0.0–0.2)

## 2021-07-21 LAB — MAGNESIUM: Magnesium: 1.8 mg/dL (ref 1.7–2.4)

## 2021-07-21 MED ORDER — MENTHOL 3 MG MT LOZG
1.0000 | LOZENGE | OROMUCOSAL | Status: DC | PRN
  Administered 2021-07-21: 3 mg via ORAL
  Filled 2021-07-21 (×2): qty 9

## 2021-07-21 MED ORDER — ALBUMIN HUMAN 25 % IV SOLN
25.0000 g | Freq: Once | INTRAVENOUS | Status: AC
  Administered 2021-07-22: 25 g via INTRAVENOUS
  Filled 2021-07-21: qty 100

## 2021-07-21 MED ORDER — JUVEN PO PACK: 1.0000 | PACK | Freq: Two times a day (BID) | ORAL | Status: DC

## 2021-07-21 MED ORDER — IPRATROPIUM-ALBUTEROL 0.5-2.5 (3) MG/3ML IN SOLN
3.0000 mL | Freq: Three times a day (TID) | RESPIRATORY_TRACT | Status: DC
  Administered 2021-07-21 – 2021-07-22 (×3): 3 mL via RESPIRATORY_TRACT
  Filled 2021-07-21 (×3): qty 3

## 2021-07-21 MED ORDER — PROSOURCE PLUS PO LIQD
30.0000 mL | Freq: Two times a day (BID) | ORAL | Status: DC
  Filled 2021-07-21 (×4): qty 30

## 2021-07-21 MED ORDER — BENZONATATE 100 MG PO CAPS: 100.0000 mg | ORAL_CAPSULE | Freq: Three times a day (TID) | ORAL | Status: DC | PRN

## 2021-07-21 MED ORDER — GUAIFENESIN-DM 100-10 MG/5ML PO SYRP: 10.0000 mL | ORAL_SOLUTION | Freq: Four times a day (QID) | ORAL | Status: DC | PRN

## 2021-07-21 MED ORDER — RENA-VITE PO TABS
1.0000 | ORAL_TABLET | Freq: Every day | ORAL | Status: DC
  Administered 2021-07-21: 1 via ORAL
  Filled 2021-07-21: qty 1

## 2021-07-21 MED ORDER — SODIUM CHLORIDE 0.9% IV SOLUTION: Freq: Once | INTRAVENOUS | Status: DC

## 2021-07-21 NOTE — Progress Notes (Signed)
Hgb dropped from 7.8 yesterday to 6.9 today. Jordan [name] requested that I inform day care team. Also, the patients friend,  Jordan [name],  said that the patient had blood in his urine yesterday a.m., 07/20/21 while in the ICU and wanted to make sure that the nurse passed this on to you because she was worried he had kidney stones. He did not void any on night shift

## 2021-07-21 NOTE — Progress Notes (Signed)
Nutrition Follow-up  DOCUMENTATION CODES:   Non-severe (moderate) malnutrition in context of chronic illness  INTERVENTION:   -30 ml Prosource Plus BID, each supplement provides 100 kcals and 15 grams protein -1 packet Juven BID, each packet provides 95 calories, 2.5 grams of protein (collagen), and 9.8 grams of carbohydrate (3 grams sugar); also contains 7 grams of L-arginine and L-glutamine, 300 mg vitamin C, 15 mg vitamin E, 1.2 mcg vitamin B-12, 9.5 mg zinc, 200 mg calcium, and 1.5 g  Calcium Beta-hydroxy-Beta-methylbutyrate to support wound healing  -Renal MVI daily  NUTRITION DIAGNOSIS:   Moderate Malnutrition related to chronic illness (ESRD on HD, COPD, etoh abuse) as evidenced by moderate fat depletion, severe fat depletion, moderate muscle depletion, severe muscle depletion.  Ongoing  GOAL:   Patient will meet greater than or equal to 90% of their needs  Progressing   MONITOR:   PO intake, Supplement acceptance, Labs, Weight trends, Skin, I & O's  REASON FOR ASSESSMENT:   Ventilator    ASSESSMENT:   55 y/o male with h/o ESRD on HD, CHF, COPD, etoh abuse, COVID 19 (04/2021) and HTN who is admitted with infected AV fistula.  10/26- s/p insertion of trialysis catheter to lt femoral vein; infected AV fistula removed 10/28- extubated, advanced to renal diet   Reviewed I/O's: -550 ml x 24 hours and +878 ml since admission  Pt unavailable at time of visit. Attempted to speak with pt via call to hospital room phone, however, unable to reach.   Per chart review, pt with confusion.   Pt with good appetite. Noted meal completions 100%.   Medications reviewed and include colace, magnesium oxide, melatonin, and miralax.  Labs reviewed.   Diet Order:   Diet Order             Diet renal with fluid restriction Fluid restriction: 1200 mL Fluid; Room service appropriate? Yes; Fluid consistency: Thin  Diet effective now                   EDUCATION NEEDS:   No  education needs have been identified at this time  Skin:  Skin Assessment: Skin Integrity Issues: Skin Integrity Issues:: Incisions, Stage II Stage II: buttocks Incisions: lt arm s/p AV fistula removal, lt femoral trialysis catheter  Last BM:  07/20/21  Height:   Ht Readings from Last 1 Encounters:  07/17/21 5\' 4"  (1.626 m)    Weight:   Wt Readings from Last 1 Encounters:  07/20/21 76.3 kg    Ideal Body Weight:  59 kg  BMI:  Body mass index is 28.87 kg/m.  Estimated Nutritional Needs:   Kcal:  1900-2100  Protein:  105-120 grams  Fluid:  1000 ml + UOP    Loistine Chance, RD, LDN, CDCES Registered Dietitian II Certified Diabetes Care and Education Specialist Please refer to [facility] for RD and/or RD on-call/weekend/after hours pager

## 2021-07-21 NOTE — H&P (View-Only) (Signed)
      Daily Progress Note   Assessment/Planning:   POD #4 s/p L BVT excision, L fem temp dialysis for infected BVT, sepsis  New temp dialysis catheter placed yesterday by PCCM Pt remains somewhat confused Continued drop in H/H: doubt is related to L arm Continue BID dressing changes to L arm Dr. Ronalee Belts will resume care tomorrow   Subjective  - 4 Days Post-Op   confused   Objective   Vitals:   07/20/21 2241 07/21/21 0127 07/21/21 0239 07/21/21 0800  BP: 135/84  121/84   Pulse: 84   (!) 104  Resp: (!) 22  20 20   Temp: 97.8 F (36.6 C)  97.8 F (36.6 C)   TempSrc: Axillary  Axillary   SpO2:  96% 98% 95%  Weight:      Height:         Intake/Output Summary (Last 24 hours) at 07/21/2021 0814 Last data filed at 07/21/2021 0456 Gross per 24 hour  Intake 470 ml  Output 1500 ml  Net -1030 ml    GEN confused  VASC L arm: no active bleeding, no bleed through, left forearm swelling improved, moving L hand spontaneous, faintly palpable radial pulse, pt refused dsg change    Laboratory   CBC CBC Latest Ref Rng & Units 07/21/2021 07/20/2021 07/19/2021  WBC 4.0 - 10.5 K/uL 8.7 9.2 8.5  Hemoglobin 13.0 - 17.0 g/dL 6.9(L) 7.8(L) 8.8(L)  Hematocrit 39.0 - 52.0 % 20.7(L) 23.5(L) 26.2(L)  Platelets 150 - 400 K/uL 133(L) 148(L) 145(L)    BMET    Component Value Date/Time   NA 136 07/21/2021 0430   K 3.7 07/21/2021 0430   CL 97 (L) 07/21/2021 0430   CO2 28 07/21/2021 0430   GLUCOSE 120 (H) 07/21/2021 0430   BUN 24 (H) 07/21/2021 0430   CREATININE 3.78 (H) 07/21/2021 0430   CALCIUM 8.2 (L) 07/21/2021 0430   GFRNONAA 18 (L) 07/21/2021 0430   GFRAA 4 (L) 06/20/2020 0414     Adele Barthel, MD, FACS, FSVS Covering for [facility]: [phone]  07/21/2021, 8:14 AM

## 2021-07-21 NOTE — Progress Notes (Signed)
[facility]  ROUNDING NOTE   Subjective:   [name] is a 55 year old male with end stage renal disease on hemodialysis, alcohol abuse, atrial fibrillation, COPD.   Patient left AMA on 10/25,returned with worsening left AVF infection. Patient underwent left AVF removal by Dr. Delana Meyer on 10/26.   Patient received HD yesterday with ultrafiltration of 1500 cc Does not feel any significant improvement with regards to itching despite dialysis   Objective:  Vital signs in last 24 hours:  Temp:  [97.5 F (36.4 C)-97.9 F (36.6 C)] 97.8 F (36.6 C) (10/30 0239) Pulse Rate:  [78-106] 104 (10/30 0800) Resp:  [18-22] 20 (10/30 0800) BP: (121-152)/(73-98) 121/84 (10/30 0239) SpO2:  [95 %-100 %] 95 % (10/30 0800) Weight:  [76.3 kg-77.6 kg] 76.3 kg (10/29 2135)  Weight change: 0.2 kg Filed Weights   07/19/21 1301 07/20/21 1808 07/20/21 2135  Weight: 76.3 kg 77.6 kg 76.3 kg     Intake/Output: I/O last 3 completed shifts: In: 950 [P.O.:720; Other:100; IV Piggyback:130] Out: 1500 [Other:1500]   Intake/Output this shift:  No intake/output data recorded.  Physical Exam: General: Awake, alert,appears uncomfortable with itching  Head: Normocephalic,atraumatic  Lungs:  Diminished at the bases, respirations even,unlabored,on O2  Heart: Tachycardic    Abdomen:  Soft, nontender, distended  Extremities:  + Dependent peripheral edema  Neurologic: Able to answer simple questions, appears forgetful  Skin: Left arm dressings,scratch marks on arms and legs  Access: No HD access    Basic Metabolic Panel: Recent Labs  Lab 07/17/21 0110 07/17/21 2353 07/18/21 0423 07/19/21 0430 07/19/21 1644 07/20/21 0537 07/21/21 0430  NA 135 137 138 134*  --  134* 136  K 3.5 4.3 4.7 4.0  --  3.0* 3.7  CL 97* 98 98 96*  --  94* 97*  CO2 26 22 22 27   --  27 28  GLUCOSE 101* 95 100* 101*  --  105* 120*  BUN 57* 64* 67* 43*  --  32* 24*  CREATININE 7.40* 7.94* 8.28* 5.81*  --   4.81* 3.78*  CALCIUM 8.3* 8.3* 8.4* 8.1*  --  7.7* 8.2*  MG 1.8  --  2.0 1.7 2.0 1.6* 1.8  PHOS 5.2*  --  8.5* 6.7*  --  4.3 2.9     Liver Function Tests: Recent Labs  Lab 07/15/21 1503 07/17/21 0110 07/17/21 2353  AST  --  16 13*  ALT  --  12 10  ALKPHOS  --  117 102  BILITOT  --  0.9 1.3*  PROT  --  6.6 6.2*  ALBUMIN 2.7* 2.6* 2.4*    No results for input(s): LIPASE, AMYLASE in the last 168 hours.  Recent Labs  Lab 07/17/21 2353  AMMONIA <10     CBC: Recent Labs  Lab 07/17/21 0110 07/17/21 1232 07/17/21 2353 07/18/21 0423 07/19/21 0430 07/20/21 0537 07/21/21 0430  WBC 9.1  --  7.0 6.5 8.5 9.2 8.7  NEUTROABS 7.5  --   --   --   --   --   --   HGB 6.5*   < > 7.6* 8.2* 8.8* 7.8* 6.9*  HCT 20.2*   < > 24.0* 25.8* 26.2* 23.5* 20.7*  MCV 94.8  --  92.7 94.2 91.9 92.5 91.6  PLT 130*  --  121* 120* 145* 148* 133*   < > = values in this interval not displayed.     Cardiac Enzymes: No results for input(s): CKTOTAL, CKMB, CKMBINDEX, TROPONINI in the  last 168 hours.  BNP: Invalid input(s): POCBNP  CBG: Recent Labs  Lab 07/17/21 2014  GLUCAP 65     Microbiology: Results for orders placed or performed during the hospital encounter of 07/16/21  Blood Culture (routine x 2)     Status: None (Preliminary result)   Collection Time: 07/17/21  1:10 AM   Specimen: BLOOD  Result Value Ref Range Status   Specimen Description BLOOD RIGHT HAND  Final   Special Requests   Final    BOTTLES DRAWN AEROBIC AND ANAEROBIC Blood Culture results may not be optimal due to an excessive volume of blood received in culture bottles   Culture   Final    NO GROWTH 4 DAYS Performed at [facility], [address] [place], [place] [address]    Report Status PENDING  Incomplete  Blood Culture (routine x 2)     Status: None (Preliminary result)   Collection Time: 07/17/21  1:10 AM   Specimen: BLOOD  Result Value Ref Range Status   Specimen Description BLOOD RIGHT  ASSIST CONTROL  Final   Special Requests   Final    BOTTLES DRAWN AEROBIC AND ANAEROBIC Blood Culture adequate volume   Culture   Final    NO GROWTH 4 DAYS Performed at [facility], [address], [place], [place] [address]    Report Status PENDING  Incomplete  Resp Panel by RT-PCR (Flu A&B, Covid) Nasopharyngeal Swab     Status: None   Collection Time: 07/17/21  1:10 AM   Specimen: Nasopharyngeal Swab; Nasopharyngeal(NP) swabs in vial transport medium  Result Value Ref Range Status   SARS Coronavirus 2 by RT PCR NEGATIVE NEGATIVE Final    Comment: (NOTE) SARS-CoV-2 target nucleic acids are NOT DETECTED.  The SARS-CoV-2 RNA is generally detectable in upper respiratory specimens during the acute phase of infection. The lowest concentration of SARS-CoV-2 viral copies this assay can detect is 138 copies/mL. A negative result does not preclude SARS-Cov-2 infection and should not be used as the sole basis for treatment or other patient management decisions. A negative result may occur with  improper specimen collection/handling, submission of specimen other than nasopharyngeal swab, presence of viral mutation(s) within the areas targeted by this assay, and inadequate number of viral copies(<138 copies/mL). A negative result must be combined with clinical observations, patient history, and epidemiological information. The expected result is Negative.  Fact Sheet for Patients:  EntrepreneurPulse.com.au  Fact Sheet for Healthcare Providers:  IncredibleEmployment.be  This test is no t yet approved or cleared by the [place] FDA and  has been authorized for detection and/or diagnosis of SARS-CoV-2 by FDA under an Emergency Use Authorization (EUA). This EUA will remain  in effect (meaning this test can be used) for the duration of the COVID-19 declaration under Section 564(b)(1) of the Act, 21 U.S.C.section 360bbb-3(b)(1), unless the  authorization is terminated  or revoked sooner.       Influenza A by PCR NEGATIVE NEGATIVE Final   Influenza B by PCR NEGATIVE NEGATIVE Final    Comment: (NOTE) The Xpert Xpress SARS-CoV-2/FLU/RSV plus assay is intended as an aid in the diagnosis of influenza from Nasopharyngeal swab specimens and should not be used as a sole basis for treatment. Nasal washings and aspirates are unacceptable for Xpert Xpress SARS-CoV-2/FLU/RSV testing.  Fact Sheet for Patients: EntrepreneurPulse.com.au  Fact Sheet for Healthcare Providers: IncredibleEmployment.be  This test is not yet approved or cleared by the [place] and has been authorized for  detection and/or diagnosis of SARS-CoV-2 by FDA under an Emergency Use Authorization (EUA). This EUA will remain in effect (meaning this test can be used) for the duration of the COVID-19 declaration under Section 564(b)(1) of the Act, 21 U.S.C. section 360bbb-3(b)(1), unless the authorization is terminated or revoked.  Performed at [facility], [address], [place], [place] [address]   Aerobic/Anaerobic Culture w Gram Stain (surgical/deep wound)     Status: None (Preliminary result)   Collection Time: 07/17/21  1:10 AM   Specimen: Left Antecubital; Wound  Result Value Ref Range Status   Specimen Description   Final    LEFT ANTECUBITAL Performed at [facility], [address], [place], [place] [address]    Special Requests   Final    NONE Performed at [facility], [place], [place] [address]    Gram Stain   Final    NO SQUAMOUS EPITHELIAL CELLS SEEN MODERATE WBC SEEN RARE GRAM NEGATIVE RODS Performed at [facility], [place] [address] [place], [place] [address]    Culture   Final    MODERATE SERRATIA MARCESCENS NO ANAEROBES ISOLATED; CULTURE IN PROGRESS FOR 5 DAYS    Report Status PENDING  Incomplete   Organism ID, Bacteria SERRATIA  MARCESCENS  Final      Susceptibility   Serratia marcescens - MIC*    CEFAZOLIN >=64 RESISTANT Resistant     CEFEPIME <=0.12 SENSITIVE Sensitive     CEFTAZIDIME <=1 SENSITIVE Sensitive     CEFTRIAXONE <=0.25 SENSITIVE Sensitive     CIPROFLOXACIN <=0.25 SENSITIVE Sensitive     GENTAMICIN <=1 SENSITIVE Sensitive     TRIMETH/SULFA <=20 SENSITIVE Sensitive     * MODERATE SERRATIA MARCESCENS  Aerobic/Anaerobic Culture w Gram Stain (surgical/deep wound)     Status: None (Preliminary result)   Collection Time: 07/17/21  5:30 PM   Specimen: PATH Other; Tissue  Result Value Ref Range Status   Specimen Description   Final    WOUND Performed at [facility], [address], [place], [place] [address]    Special Requests   Final    LEFT UPPER ARM Performed at [facility], [address] [place], [place] [address]    Gram Stain   Final    RARE WBC PRESENT, PREDOMINANTLY PMN NO ORGANISMS SEEN Performed at [facility], [place] [address], [place], [place] [address]    Culture   Final    MODERATE SERRATIA MARCESCENS NO ANAEROBES ISOLATED; CULTURE IN PROGRESS FOR 5 DAYS    Report Status PENDING  Incomplete   Organism ID, Bacteria SERRATIA MARCESCENS  Final      Susceptibility   Serratia marcescens - MIC*    CEFAZOLIN >=64 RESISTANT Resistant     CEFEPIME <=0.12 SENSITIVE Sensitive     CEFTAZIDIME <=1 SENSITIVE Sensitive     CEFTRIAXONE <=0.25 SENSITIVE Sensitive     CIPROFLOXACIN <=0.25 SENSITIVE Sensitive     GENTAMICIN <=1 SENSITIVE Sensitive     TRIMETH/SULFA <=20 SENSITIVE Sensitive     * MODERATE SERRATIA MARCESCENS  Aerobic/Anaerobic Culture w Gram Stain (surgical/deep wound)     Status: None (Preliminary result)   Collection Time: 07/17/21  5:50 PM   Specimen: PATH Other; Tissue  Result Value Ref Range Status   Specimen Description WOUND  Final   Special Requests LEFT UPPER ARM  Final   Gram Stain   Final    FEW WBC PRESENT, PREDOMINANTLY PMN FEW  GRAM NEGATIVE  RODS    Culture   Final    MODERATE SERRATIA MARCESCENS NO ANAEROBES ISOLATED; CULTURE IN PROGRESS FOR 5 DAYS    Report Status PENDING  Incomplete   Organism ID, Bacteria SERRATIA MARCESCENS  Final      Susceptibility   Serratia marcescens - MIC*    CEFAZOLIN >=64 RESISTANT Resistant     CEFEPIME <=0.12 SENSITIVE Sensitive     CEFTAZIDIME <=1 SENSITIVE Sensitive     CEFTRIAXONE <=0.25 SENSITIVE Sensitive     CIPROFLOXACIN <=0.25 SENSITIVE Sensitive     GENTAMICIN <=1 SENSITIVE Sensitive     TRIMETH/SULFA Value in next row Sensitive      <=20 SENSITIVEPerformed at [facility], [address] [place], [place] [address]    * MODERATE SERRATIA MARCESCENS  CULTURE, BLOOD (ROUTINE X 2) w Reflex to ID Panel     Status: None (Preliminary result)   Collection Time: 07/19/21  1:02 PM   Specimen: BLOOD  Result Value Ref Range Status   Specimen Description BLOOD BLOOD RIGHT HAND  Final   Special Requests   Final    BOTTLES DRAWN AEROBIC AND ANAEROBIC Blood Culture adequate volume   Culture   Final    NO GROWTH 2 DAYS Performed at [facility], [address] [place], [place] [address]    Report Status PENDING  Incomplete  CULTURE, BLOOD (ROUTINE X 2) w Reflex to ID Panel     Status: None (Preliminary result)   Collection Time: 07/19/21  2:11 PM   Specimen: BLOOD  Result Value Ref Range Status   Specimen Description BLOOD BLOOD RIGHT HAND  Final   Special Requests   Final    BOTTLES DRAWN AEROBIC ONLY Blood Culture results may not be optimal due to an inadequate volume of blood received in culture bottles   Culture   Final    NO GROWTH 2 DAYS Performed at [facility], [address] [place], [place] [address]    Report Status PENDING  Incomplete  Cath Tip Culture     Status: None (Preliminary result)   Collection Time: 07/19/21  5:57 PM   Specimen: Catheter Tip; Other  Result Value Ref Range Status   Specimen Description   Final    CATH  TIP Performed at [facility], [address], [place], [place] [address]    Special Requests   Final    NONE Performed at [facility], [address], [place], [place] [address]    Culture   Final    TOO YOUNG TO READ Performed at [facility], [place] [address] [place], [place] [address]    Report Status PENDING  Incomplete  CULTURE, BLOOD (ROUTINE X 2) w Reflex to ID Panel     Status: None (Preliminary result)   Collection Time: 07/19/21  7:03 PM   Specimen: BLOOD  Result Value Ref Range Status   Specimen Description BLOOD RFOA  Final   Special Requests   Final    BOTTLES DRAWN AEROBIC AND ANAEROBIC Blood Culture adequate volume   Culture   Final    NO GROWTH 2 DAYS Performed at [facility], [address] [place], [place] [address]    Report Status PENDING  Incomplete  CULTURE, BLOOD (ROUTINE X 2) w Reflex to ID Panel     Status: None (Preliminary result)   Collection Time: 07/19/21  7:09 PM   Specimen: BLOOD  Result Value Ref Range Status   Specimen Description  BLOOD BLRA  Final   Special Requests   Final    BOTTLES DRAWN AEROBIC AND ANAEROBIC Blood Culture results may not be optimal due to an excessive volume of blood received in culture bottles   Culture   Final    NO GROWTH 2 DAYS Performed at [facility], [address] [place], [place] [address]    Report Status PENDING  Incomplete    Coagulation Studies: No results for input(s): LABPROT, INR in the last 72 hours.   Urinalysis: No results for input(s): COLORURINE, LABSPEC, PHURINE, GLUCOSEU, HGBUR, BILIRUBINUR, KETONESUR, PROTEINUR, UROBILINOGEN, NITRITE, LEUKOCYTESUR in the last 72 hours.  Invalid input(s): APPERANCEUR    Imaging: DG Chest Port 1 View  Result Date: 07/20/2021 CLINICAL DATA:  Central line placement. EXAM: PORTABLE CHEST 1 VIEW COMPARISON:  Radiograph 07/17/2021.  CT 05/03/2021 FINDINGS: Interval extubation. Left internal jugular central  venous catheter tip projects over the upper SVC. Unchanged enlarged cardiopericardial silhouette. Advanced aortic atherosclerosis. Development of pulmonary edema from prior exam. There may be trace left pleural effusion. No pneumothorax. IMPRESSION: 1. Left internal jugular central venous catheter with tip over the upper SVC. No pneumothorax. 2. Unchanged cardiomegaly/enlargement of the cardiopericardial silhouette. Development of pulmonary edema from prior exam. Electronically Signed   By: Keith Rake M.D.   On: 07/20/2021 15:50   ECHOCARDIOGRAM COMPLETE  Result Date: 07/20/2021    ECHOCARDIOGRAM REPORT   Patient Name:   Jordan [name] Date of Exam: 07/20/2021 Medical Rec #:  [id number]        Height:       64.0 in Accession #:    [id number]       Weight:       168.2 lb Date of Birth:  November 09, 1965        BSA:          1.818 m Patient Age:    16 years         BP:           117/74 mmHg Patient Gender: M                HR:           59 bpm. Exam Location:  [facility] Procedure: 2D Echo, Cardiac Doppler and Color Doppler Indications:     Bacteremia R78.81  History:         Patient has prior history of Echocardiogram examinations. Risk                  Factors:Hypertension.  Sonographer:     Alyse Low Roar Referring Phys:  [place] Diagnosing Phys: Skeet Latch MD IMPRESSIONS  1. Left ventricular ejection fraction, by estimation, is 45 to 50%. The left ventricle has mildly decreased function. The left ventricle has no regional wall motion abnormalities. There is mild concentric left ventricular hypertrophy. Left ventricular diastolic parameters are consistent with Grade II diastolic dysfunction (pseudonormalization). Elevated left ventricular end-diastolic pressure.  2. Right ventricular systolic function is normal. The right ventricular size is mildly enlarged. There is severely elevated pulmonary artery systolic pressure.  3. Left atrial size was severely dilated.  4. Right atrial size was  severely dilated.  5. There is no evidence of cardiac tamponade.  6. The mitral valve is degenerative. Mild to moderate mitral valve regurgitation. No evidence of mitral stenosis. Moderate mitral annular calcification.  7. Tricuspid valve regurgitation is severe.  8. The aortic valve is tricuspid. There is mild calcification of the aortic valve. There is  mild thickening of the aortic valve. Aortic valve regurgitation is not visualized. No aortic stenosis is present.  9. The inferior vena cava is dilated in size with >50% respiratory variability, suggesting right atrial pressure of 8 mmHg. Comparison(s): Compared with the echo 06/03/21, the pericardial effusion is larger. There is no tamponade. FINDINGS  Left Ventricle: Left ventricular ejection fraction, by estimation, is 45 to 50%. The left ventricle has mildly decreased function. The left ventricle has no regional wall motion abnormalities. The left ventricular internal cavity size was normal in size. There is mild concentric left ventricular hypertrophy. Left ventricular diastolic parameters are consistent with Grade II diastolic dysfunction (pseudonormalization). Elevated left ventricular end-diastolic pressure. Right Ventricle: The right ventricular size is mildly enlarged. No increase in right ventricular wall thickness. Right ventricular systolic function is normal. There is severely elevated pulmonary artery systolic pressure. The tricuspid regurgitant velocity is 4.10 m/s, and with an assumed right atrial pressure of 8 mmHg, the estimated right ventricular systolic pressure is 60.4 mmHg. Left Atrium: Left atrial size was severely dilated. Right Atrium: Right atrial size was severely dilated. Pericardium: Trivial pericardial effusion is present. There is no evidence of cardiac tamponade. Mitral Valve: The mitral valve is degenerative in appearance. There is moderate thickening of the mitral valve leaflet(s). There is moderate calcification of the mitral valve  leaflet(s). Moderate mitral annular calcification. Mild to moderate mitral valve regurgitation. No evidence of mitral valve stenosis. Tricuspid Valve: The tricuspid valve is normal in structure. Tricuspid valve regurgitation is severe. No evidence of tricuspid stenosis. Aortic Valve: The aortic valve is tricuspid. There is mild calcification of the aortic valve. There is mild thickening of the aortic valve. Aortic valve regurgitation is not visualized. No aortic stenosis is present. Aortic valve peak gradient measures 8.5 mmHg. Pulmonic Valve: The pulmonic valve was normal in structure. Pulmonic valve regurgitation is trivial. No evidence of pulmonic stenosis. Aorta: The aortic root is normal in size and structure. Venous: The inferior vena cava is dilated in size with greater than 50% respiratory variability, suggesting right atrial pressure of 8 mmHg. IAS/Shunts: No atrial level shunt detected by color flow Doppler. Additional Comments: There is pleural effusion in the right lateral region.  LEFT VENTRICLE PLAX 2D LVIDd:         4.70 cm      Diastology LVIDs:         3.70 cm      LV e' medial:    5.11 cm/s LV PW:         1.30 cm      LV E/e' medial:  23.9 LV IVS:        1.30 cm      LV e' lateral:   9.25 cm/s LVOT diam:     1.70 cm      LV E/e' lateral: 13.2 LVOT Area:     2.27 cm  LV Volumes (MOD) LV vol d, MOD A2C: 105.0 ml LV vol d, MOD A4C: 113.0 ml LV vol s, MOD A2C: 64.0 ml LV vol s, MOD A4C: 65.9 ml LV SV MOD A2C:     41.0 ml LV SV MOD A4C:     113.0 ml LV SV MOD BP:      41.7 ml RIGHT VENTRICLE RV Basal diam:  4.70 cm RV Mid diam:    3.70 cm RV S prime:     8.70 cm/s TAPSE (M-mode): 1.0 cm LEFT ATRIUM  Index        RIGHT ATRIUM           Index LA diam:        4.40 cm 2.42 cm/m   RA Area:     24.50 cm LA Vol (A2C):   88.8 ml 48.86 ml/m  RA Volume:   85.70 ml  47.15 ml/m LA Vol (A4C):   60.4 ml 33.23 ml/m LA Biplane Vol: 73.0 ml 40.16 ml/m  AORTIC VALVE                 PULMONIC VALVE AV  Area (Vmax): 1.91 cm     PV Vmax:          0.96 m/s AV Vmax:        146.00 cm/s  PV Peak grad:     3.7 mmHg AV Peak Grad:   8.5 mmHg     PR End Diast Vel: 13.25 msec LVOT Vmax:      123.00 cm/s  RVOT Peak grad:   1 mmHg  AORTA Ao Root diam: 2.90 cm MITRAL VALVE                TRICUSPID VALVE MV Area (PHT): 5.54 cm     TR Peak grad:   67.2 mmHg MV Decel Time: 137 msec     TR Vmax:        410.00 cm/s MV E velocity: 122.00 cm/s MV A velocity: 93.80 cm/s   SHUNTS MV E/A ratio:  1.30         Systemic Diam: 1.70 cm MV A Prime:    6.7 cm/s Skeet Latch MD Electronically signed by Skeet Latch MD Signature Date/Time: 07/20/2021/3:09:33 PM    Final      Medications:    sodium chloride     sodium chloride     ceFEPime (MAXIPIME) IV Stopped (07/20/21 2321)   fentaNYL infusion INTRAVENOUS Stopped (07/19/21 1508)      (feeding supplement) PROSource Plus  30 mL Oral BID BM   sodium chloride   Intravenous Once   Chlorhexidine Gluconate Cloth  6 each Topical Q0600   docusate  100 mg Per Tube BID   famotidine  20 mg Oral Daily   ipratropium-albuterol  3 mL Nebulization TID   magnesium oxide  400 mg Oral BID   melatonin  5 mg Oral QHS   multivitamin  1 tablet Oral QHS   nutrition supplement (JUVEN)  1 packet Oral BID BM   polyethylene glycol  17 g Per Tube Daily   triamcinolone cream   Topical BID      Assessment/ Plan:  [name] is a 55 y.o. black male with end stage renal disease on hemodialysis, hypertension, COPD, EtOH abuse, atrial fibrillation, congestive heart failure who is admitted to [facility] on 07/16/2021 for Bacteremia [R78.81] ESRD (end stage renal disease) ([place]) [N18.6] Wound infection [T14.8XXA, L08.9] ESRD on hemodialysis ([place]) [N18.6, Z99.2] Acute on chronic anemia [D64.9] ABLA (acute blood loss anemia) [D62]   [facility] MWF [facility] [address] Left AVF 67kg   #End Stage Renal Disease on dialysis with fluid volume overload. With complication of dialysis  device. Status post removal of infected left arm AVF on 10/26 by Dr. Delana Meyer.  HD yesterday with UF of 1500 ml Volume removal with hemodialysis as tolerated, with IV albumin support   #Secondary Hyperparathyroidism:  Lab Results  Component Value Date   PTH 669 (H) 02/20/2020   CALCIUM 8.2 (L) 07/21/2021   CAION  1.12 (L) 06/02/2021   PHOS 2.9 07/21/2021   Will continue monitoring bone mineral metabolism parameters closely  #Anemia with chronic kidney disease: with acute blood loss anemia. Status post PRBC transfusion on 10/26.  Lab Results  Component Value Date   HGB 6.9 (L) 07/21/2021   Will plan for  EPOGEN with MWF HD treatments.   # Sepsis with bacteremia: blood cultures on 10/24 with serratia species. Source seems to be AVF with superimposed cellulitis.  Patient is on Cefepime  WBC now in normal range   LOS: [place] 10/30/202211:27 AM

## 2021-07-21 NOTE — Progress Notes (Signed)
Patient has been unable to sleep. He has been very restless since the start of my shift (07/20/21 at 1900). He has yet to get good relief from the itching all over. I have applied several creams liberally. I have also given him atarax and oral benadryl. Patient asked for sleeping medication. Sharion Settler NP on call ordered melatonin. Patient did have an episode of jerking his head back and forth for about 15 seconds. Hassan Rowan was informed of this.

## 2021-07-21 NOTE — Progress Notes (Addendum)
      Daily Progress Note   Assessment/Planning:   POD #4 s/p L BVT excision, L fem temp dialysis for infected BVT, sepsis  New temp dialysis catheter placed yesterday by PCCM Pt remains somewhat confused Continued drop in H/H: doubt is related to L arm Continue BID dressing changes to L arm Dr. Ronalee Belts will resume care tomorrow   Subjective  - 4 Days Post-Op   confused   Objective   Vitals:   07/20/21 2241 07/21/21 0127 07/21/21 0239 07/21/21 0800  BP: 135/84  121/84   Pulse: 84   (!) 104  Resp: (!) 22  20 20   Temp: 97.8 F (36.6 C)  97.8 F (36.6 C)   TempSrc: Axillary  Axillary   SpO2:  96% 98% 95%  Weight:      Height:         Intake/Output Summary (Last 24 hours) at 07/21/2021 0814 Last data filed at 07/21/2021 0456 Gross per 24 hour  Intake 470 ml  Output 1500 ml  Net -1030 ml    GEN confused  VASC L arm: no active bleeding, no bleed through, left forearm swelling improved, moving L hand spontaneous, faintly palpable radial pulse, pt refused dsg change    Laboratory   CBC CBC Latest Ref Rng & Units 07/21/2021 07/20/2021 07/19/2021  WBC 4.0 - 10.5 K/uL 8.7 9.2 8.5  Hemoglobin 13.0 - 17.0 g/dL 6.9(L) 7.8(L) 8.8(L)  Hematocrit 39.0 - 52.0 % 20.7(L) 23.5(L) 26.2(L)  Platelets 150 - 400 K/uL 133(L) 148(L) 145(L)    BMET    Component Value Date/Time   NA 136 07/21/2021 0430   K 3.7 07/21/2021 0430   CL 97 (L) 07/21/2021 0430   CO2 28 07/21/2021 0430   GLUCOSE 120 (H) 07/21/2021 0430   BUN 24 (H) 07/21/2021 0430   CREATININE 3.78 (H) 07/21/2021 0430   CALCIUM 8.2 (L) 07/21/2021 0430   GFRNONAA 18 (L) 07/21/2021 0430   GFRAA 4 (L) 06/20/2020 0414     Adele Barthel, MD, FACS, FSVS Covering for  Vascular and Vein Surgery: [phone]  07/21/2021, 8:14 AM

## 2021-07-21 NOTE — Progress Notes (Signed)
[facility] Progress Note  Patient: Jordan [name]    WTU:[id number]  DOA: 07/16/2021     Date of Service: the patient was seen and examined on 07/21/2021  Chief Complaint  Patient presents with   Vascular Access Problem   Brief hospital course: Jordan [name] is a 55 y.o. male with medical history significant for ESRD on HD MWF, alcohol abuse, COPD, medication and dialysis noncompliance with frequent hospitalizations for fluid overload, who presents to the hospital by EMS due to bleeding from his left arm dialysis fistula for the past 1 hour that has continued and spite of pressure dressings applied by EMS.,  Patient was recently hospitalized on 10/21 for acute pulmonary edema secondary to missing dialysis having to be intubated for severe respiratory and uremic encephalopathy.  He underwent fistulogram on 10/24 and found to have a thrombosed AV fistula and started on a heparin infusion.  He signed out [place] on 10/24 with a femoral catheter in situ.  He was apparently last dialyzed on 10/25 following which the femoral catheter was pulled.  On the same night he returns to the ED due to bleeding from the left AV fistula. He is denying shortness of breath or chest pain, fever or chills. Of note patient did have positive blood cultures from 10/24 that showed Enterobacterales and Serratia marcescens .  He was seen by ID who recommended cefepime and repeating blood cultures until clearance of bacteremia   ED course: Upon arrival, AV graft had stopped bleeding, per ED provider.  Vitals were stable with BP 117/80, pulse 91 O2 sat 100% and temp 98.6 Blood work: Hemoglobin 6.5, down from 8.4 on 10/24 platelets 130 which is above baseline WBC 9.1 with lactic acid 1.5   EKG, personally viewed and interpreted: Sinus at 92 with no acute ST-T wave changes   Chest x-ray: Mild, stable, diffusely increased interstitial lung markings which are likely, in part chronic in nature.  A mild superimposed  component of interstitial edema cannot be excluded   Patient given cefepime and vancomycin in the ED.  Blood cultures done.  Hospitalist consulted for admission.  Patient was seen by vascular surgery, patient was taken to the OR on 10/26 for removal of infected left brachial basilic AV fistula and insertion of dialysis catheter after left femoral vein.  Patient was not successfully extubated after the procedure and he was reintubated and transferred to ICU.   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 Continue monitor H&H and transfuse if hemoglobin less than 7 Vascular surgery consulted, s/p removal of infected AV fistula, left femoral dialysis catheter was inserted for dialysis.  It was removed on 10/28 after dialysis. 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 - Blood culture from 10/21 with Serratia  - Patient was seen by ID on 10/24,  so reconsulted ID for antibiotics and duration of treatment - Continue cefepime - Follow repeat blood cultures     AV fistula thrombosis, S/p removal of AV fistula, dialysis catheter was inserted left femoral vein which was removed after dialysis on 10/28 - Vascular surgery consulted - Patient had femoral dialysis access that was apparently pulled  following dialysis on 10/25      ESRD on dialysis  - Nephrology consult for continuation of dialysis once access is placed - Vascular consulted for access   Hypokalemia, potassium repleted.  Hypomagnesemia, mag repleted.   Suspect scabies - Patient with multiple excoriation marks on extremities -- Patient was given permethrin topical application x1 dose -  ID consulted - Contact precautions - Antipruritics     Chronic diastolic CHF (congestive heart failure) ([place]) - Patient does not appear acutely overloaded at this time and chest x-ray appears stable - Continue dialysis     COPD (chronic obstructive pulmonary disease) ([place]) - Not acutely exacerbated Continue symptomatic treatment for cough Continue oral care, patient was complaining of dry mouth   Body mass index is 28.34 kg/m.  Interventions:   Pressure Injury 07/14/21 Buttocks Mid Stage 2 -  Partial thickness loss of dermis presenting as a shallow open injury with a red, pink wound bed without slough. open area (Active)  07/14/21 1402  Location: Buttocks  Location Orientation: Mid  Staging: Stage 2 -  Partial thickness loss of dermis presenting as a shallow open injury with a red, pink wound bed without slough.  Wound Description (Comments): open area  Present on Admission: Yes     Diet: NPO for now, resume dialysis diet when procedure is done DVT Prophylaxis: SCD, pharmacological prophylaxis contraindicated due to active bleeding    Advance goals of care discussion: Full code  Family Communication: family was NOT present at bedside, at the time of interview.  The pt provided permission to discuss medical plan with the family. Opportunity was given to ask question and all questions were answered satisfactorily.   Disposition:  Pt is from Home, admitted with bleeding from AV fistula, bacteremia and needs hemodialysis, still has bacteremia, needs IV antibiotics and hemodialysis, which precludes a safe  discharge. Discharge to Home, when stable.  Subjective:  No significant overnight events, patient still has generalized itching, does not feel any improvement.  Patient was complaining of cough and having dry throat. Denied any chest palpitations, no any other active issues.   Physical Exam: General:  alert oriented to time, place, and person.  Appear in mild distress, affect appropriate Eyes: PERRLA ENT: Oral Mucosa Clear, moist  Neck: no JVD,  Cardiovascular: S1 and S2 Present, no Murmur,  Respiratory: good respiratory effort, Bilateral Air entry equal and Decreased, mild bibasilar crackles, no significant wheezes Abdomen: Bowel Sound present, Soft and no tenderness,  Skin: Bilateral upper and lower extremity dry scabs, healing well.   Extremities: mild Pedal edema, no calf tenderness Neurologic: without any new focal findings Gait not checked due to patient safety concerns  Vitals:   07/21/21 0239 07/21/21 0800 07/21/21 0830 07/21/21 1254  BP: 121/84  (!) 125/55   Pulse:  (!) 104 98 97  Resp: 20 20 18    Temp: 97.8 F (36.6 C)  97.8 F (36.6 C)   TempSrc: Axillary  Axillary   SpO2: 98% 95% 94% 100%  Weight:      Height:        Intake/Output Summary (Last 24 hours) at 07/21/2021 1309 Last data filed at 07/21/2021  4196 Gross per 24 hour  Intake 470 ml  Output 1500 ml  Net -1030 ml   Filed Weights   07/19/21 1301 07/20/21 1808 07/20/21 2135  Weight: 76.3 kg 77.6 kg 76.3 kg    Data Reviewed: I have personally reviewed and interpreted daily labs, tele strips, imagings as discussed above. I reviewed all nursing notes, pharmacy notes, vitals, pertinent old records I have discussed plan of care as described above with RN and patient/family.  CBC: Recent Labs  Lab 07/17/21 0110 07/17/21 1232 07/17/21 2353 07/18/21 0423 07/19/21 0430 07/20/21 0537 07/21/21 0430  WBC 9.1  --  7.0 6.5 8.5 9.2 8.7  NEUTROABS 7.5  --   --   --   --   --   --   HGB 6.5*   < > 7.6*  8.2* 8.8* 7.8* 6.9*  HCT 20.2*   < > 24.0* 25.8* 26.2* 23.5* 20.7*  MCV 94.8  --  92.7 94.2 91.9 92.5 91.6  PLT 130*  --  121* 120* 145* 148* 133*   < > = values in this interval not displayed.   Basic Metabolic Panel: Recent Labs  Lab 07/17/21 0110 07/17/21 2353 07/18/21 0423 07/19/21 0430 07/19/21 1644 07/20/21 0537 07/21/21 0430  NA 135 137 138 134*  --  134* 136  K 3.5 4.3 4.7 4.0  --  3.0* 3.7  CL 97* 98 98 96*  --  94* 97*  CO2 26 22 22 27   --  27 28  GLUCOSE 101* 95 100* 101*  --  105* 120*  BUN 57* 64* 67* 43*  --  32* 24*  CREATININE 7.40* 7.94* 8.28* 5.81*  --  4.81* 3.78*  CALCIUM 8.3* 8.3* 8.4* 8.1*  --  7.7* 8.2*  MG 1.8  --  2.0 1.7 2.0 1.6* 1.8  PHOS 5.2*  --  8.5* 6.7*  --  4.3 2.9    Studies: DG Chest Port 1 View  Result Date: 07/20/2021 CLINICAL DATA:  Central line placement. EXAM: PORTABLE CHEST 1 VIEW COMPARISON:  Radiograph 07/17/2021.  CT 05/03/2021 FINDINGS: Interval extubation. Left internal jugular central venous catheter tip projects over the upper SVC. Unchanged enlarged cardiopericardial silhouette. Advanced aortic atherosclerosis. Development of pulmonary edema from prior exam. There may be trace left pleural effusion. No pneumothorax. IMPRESSION: 1. Left internal jugular central venous catheter with tip over the upper SVC. No pneumothorax. 2. Unchanged cardiomegaly/enlargement of the cardiopericardial silhouette. Development of pulmonary edema from prior exam. Electronically Signed   By: Keith Rake M.D.   On: 07/20/2021 15:50    Scheduled Meds:  (feeding supplement) PROSource Plus  30 mL Oral BID BM   sodium chloride   Intravenous Once   Chlorhexidine Gluconate Cloth  6 each Topical Q0600   docusate  100 mg Per Tube BID   famotidine  20 mg Oral Daily   ipratropium-albuterol  3 mL Nebulization TID   magnesium oxide  400 mg Oral BID   melatonin  5 mg Oral QHS   multivitamin  1 tablet Oral QHS   nutrition supplement (JUVEN)  1 packet Oral  BID BM   polyethylene glycol  17 g Per Tube Daily   triamcinolone cream   Topical BID   Continuous Infusions:  sodium chloride     sodium chloride     [START ON 07/22/2021] albumin human     ceFEPime (MAXIPIME) IV Stopped (07/20/21 2321)   fentaNYL infusion INTRAVENOUS Stopped (07/19/21 1508)   PRN Meds: sodium chloride, sodium chloride,  acetaminophen **OR** acetaminophen, alteplase, benzonatate, camphor-menthol, diphenhydrAMINE, diphenhydrAMINE-zinc acetate, fentaNYL, guaiFENesin-dextromethorphan, heparin, hydrOXYzine, lidocaine (PF), lidocaine-prilocaine, menthol-cetylpyridinium, ondansetron **OR** ondansetron (ZOFRAN) IV, oxyCODONE-acetaminophen, pentafluoroprop-tetrafluoroeth  Time spent: 35 minutes  Author: Val Riles. MD [facility] 07/21/2021 1:09 PM  To reach On-call, see care teams to locate the attending and reach out to them via [email or web address]. If 7PM-7AM, please contact night-coverage If you still have difficulty reaching the attending provider, please page the [facility] (Director on Call) for [facility] on amion for assistance.

## 2021-07-22 LAB — CBC
HCT: 24.6 % — ABNORMAL LOW (ref 39.0–52.0)
Hemoglobin: 8.2 g/dL — ABNORMAL LOW (ref 13.0–17.0)
MCH: 30.5 pg (ref 26.0–34.0)
MCHC: 33.3 g/dL (ref 30.0–36.0)
MCV: 91.4 fL (ref 80.0–100.0)
Platelets: 141 10*3/uL — ABNORMAL LOW (ref 150–400)
RBC: 2.69 MIL/uL — ABNORMAL LOW (ref 4.22–5.81)
RDW: 17.2 % — ABNORMAL HIGH (ref 11.5–15.5)
WBC: 10.2 10*3/uL (ref 4.0–10.5)
nRBC: 0 % (ref 0.0–0.2)

## 2021-07-22 LAB — BASIC METABOLIC PANEL
Anion gap: 11 (ref 5–15)
BUN: 34 mg/dL — ABNORMAL HIGH (ref 6–20)
CO2: 29 mmol/L (ref 22–32)
Calcium: 8.6 mg/dL — ABNORMAL LOW (ref 8.9–10.3)
Chloride: 97 mmol/L — ABNORMAL LOW (ref 98–111)
Creatinine, Ser: 5.05 mg/dL — ABNORMAL HIGH (ref 0.61–1.24)
GFR, Estimated: 13 mL/min — ABNORMAL LOW (ref >60)
Glucose, Bld: 89 mg/dL (ref 70–99)
Potassium: 3.8 mmol/L (ref 3.5–5.1)
Sodium: 137 mmol/L (ref 135–145)

## 2021-07-22 LAB — AEROBIC/ANAEROBIC CULTURE W GRAM STAIN (SURGICAL/DEEP WOUND): Gram Stain: NONE SEEN

## 2021-07-22 LAB — CULTURE, BLOOD (ROUTINE X 2)
Culture: NO GROWTH
Culture: NO GROWTH
Special Requests: ADEQUATE

## 2021-07-22 LAB — CATH TIP CULTURE: Culture: 30000 — AB

## 2021-07-22 LAB — PHOSPHORUS: Phosphorus: 3.9 mg/dL (ref 2.5–4.6)

## 2021-07-22 LAB — MAGNESIUM: Magnesium: 2 mg/dL (ref 1.7–2.4)

## 2021-07-22 MED ORDER — DIPHENHYDRAMINE HCL 50 MG/ML IJ SOLN
INTRAMUSCULAR | Status: AC
  Filled 2021-07-22: qty 1

## 2021-07-22 MED ORDER — HEPARIN SODIUM (PORCINE) 1000 UNIT/ML IJ SOLN
INTRAMUSCULAR | Status: AC
  Filled 2021-07-22: qty 1

## 2021-07-22 MED ORDER — DIPHENHYDRAMINE HCL 50 MG/ML IJ SOLN
50.0000 mg | Freq: Once | INTRAMUSCULAR | Status: AC
  Administered 2021-07-22: 50 mg via INTRAVENOUS

## 2021-07-22 MED ORDER — PERMETHRIN 5 % EX CREA
TOPICAL_CREAM | Freq: Every day | CUTANEOUS | Status: DC
  Filled 2021-07-22: qty 60

## 2021-07-22 MED ORDER — ALBUMIN HUMAN 25 % IV SOLN
INTRAVENOUS | Status: AC
  Filled 2021-07-22: qty 100

## 2021-07-22 NOTE — Progress Notes (Signed)
[facility] Progress Note  Patient: Jordan [name]    FGH:[id number]  DOA: 07/16/2021     Date of Service: the patient was seen and examined on 07/22/2021  Chief Complaint  Patient presents with   Vascular Access Problem   Brief hospital course: [name] is a 55 y.o. male with medical history significant for ESRD on HD MWF, alcohol abuse, COPD, medication and dialysis noncompliance with frequent hospitalizations for fluid overload, who presents to the hospital by EMS due to bleeding from his left arm dialysis fistula for the past 1 hour that has continued and spite of pressure dressings applied by EMS.,  Patient was recently hospitalized on 10/21 for acute pulmonary edema secondary to missing dialysis having to be intubated for severe respiratory and uremic encephalopathy.  He underwent fistulogram on 10/24 and found to have a thrombosed AV fistula and started on a heparin infusion.  He signed out [place] on 10/24 with a femoral catheter in situ.  He was apparently last dialyzed on 10/25 following which the femoral catheter was pulled.  On the same night he returns to the ED due to bleeding from the left AV fistula. He is denying shortness of breath or chest pain, fever or chills. Of note patient did have positive blood cultures from 10/24 that showed Enterobacterales and Serratia marcescens .  He was seen by ID who recommended cefepime and repeating blood cultures until clearance of bacteremia   ED course: Upon arrival, AV graft had stopped bleeding, per ED provider.  Vitals were stable with BP 117/80, pulse 91 O2 sat 100% and temp 98.6 Blood work: Hemoglobin 6.5, down from 8.4 on 10/24 platelets 130 which is above baseline WBC 9.1 with lactic acid 1.5   EKG, personally viewed and interpreted: Sinus at 92 with no acute ST-T wave changes   Chest x-ray: Mild, stable, diffusely increased interstitial lung markings which are likely, in part chronic in nature.  A mild superimposed  component of interstitial edema cannot be excluded   Patient given cefepime and vancomycin in the ED.  Blood cultures done.  Hospitalist consulted for admission.  Patient was seen by vascular surgery, patient was taken to the OR on 10/26 for removal of infected left brachial basilic AV fistula and insertion of dialysis catheter after left femoral vein.  Patient was not successfully extubated after the procedure and he was reintubated and transferred to ICU.   Patient was extubated on 10/28 and transferred under [facility] hospitalist service   Assessment and Plan:   # Respiratory failure s/p intubation after the procedure of AV fistula removal, unsuccessful extubation, patient was reintubated and remained in the ICU, extubated on 10/28, Currently patient is saturating well on room air   # ABLA (acute blood loss anemia)   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 ([place]) - Patient presents with bleeding of left AV fistula with hemoglobin 6.5, down from 8.4 in the setting of recent heparin infusion for thrombosed fistula - Hemostasis achieved after arrival in the ED, patient received 1 unit of PRBC in the ED. Continue monitor H&H and transfuse if hemoglobin less than 7 Vascular surgery consulted, s/p removal of infected AV fistula, left femoral dialysis catheter was inserted for dialysis.  It was removed on 10/28 after dialysis. 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 - Follow repeat blood cultures NGTD     AV fistula thrombosis, S/p removal of AV fistula, dialysis catheter was inserted left femoral vein which was removed after dialysis on 10/28 - Vascular surgery consulted - Patient had femoral  dialysis access that was apparently pulled following dialysis on 10/25      ESRD on dialysis  - Nephrology consult for continuation of dialysis once access is placed - Vascular consulted for access   Hypokalemia, potassium repleted.  Hypomagnesemia, mag repleted.   Suspect scabies - Patient with multiple excoriation marks on extremities -- Patient was given permethrin topical application x1 dose -  ID consulted - Contact precautions - Antipruritics 10/31 patient is still having significant pruritus, does not feel any improvement so started permethrin 5% topical daily application for 7 days.  Patient needs to be cleaned before each application    Chronic diastolic CHF (congestive heart failure) - Patient does not appear acutely overloaded at this time and chest x-ray appears stable - Continue dialysis   COPD (chronic obstructive pulmonary disease)  - Not acutely exacerbated Continue symptomatic treatment for cough Continue oral care, patient was complaining of dry mouth   Body mass index is 28.34 kg/m.  Interventions:   Pressure Injury 07/14/21 Buttocks Mid Stage 2 -  Partial thickness loss of dermis presenting as a shallow open injury with a red, pink wound bed without slough. open area (Active)  07/14/21 1402  Location: Buttocks  Location Orientation: Mid  Staging: Stage 2 -  Partial thickness loss of dermis presenting as a shallow open injury with a red, pink wound bed without slough.  Wound Description (Comments): open area  Present on Admission: Yes     Diet: NPO for now, resume dialysis diet when procedure is done DVT Prophylaxis: SCD, pharmacological prophylaxis contraindicated due to active bleeding    Advance goals of care discussion: Full code  Family Communication: family was NOT present at bedside, at the time of interview.  The pt provided permission to discuss medical plan with the family. Opportunity was given to ask question and all questions were  answered satisfactorily.   Disposition:  Pt is from Home, admitted with bleeding from AV fistula, bacteremia and needs hemodialysis, still has bacteremia, needs IV antibiotics and hemodialysis, which precludes a safe discharge. Discharge to Home, when stable. Patient will need tunneled permacath and ID recommendation for antibiotics.  Subjective:  No significant overnight events, patient still has generalized itching, does not feel any improvement.   Patient was seen during hemodialysis, patient was explained that he will need permacath that may be inserted tomorrow or Wednesday and then we have to get ID clearance for disposition plan.    Physical Exam: General:  alert oriented to time, place, and person.  Appear in mild distress, affect appropriate Eyes: PERRLA ENT: Oral Mucosa Clear, moist  Neck: no JVD,  Cardiovascular: S1 and S2 Present, no Murmur,  Respiratory: good respiratory effort, Bilateral Air entry equal and Decreased, mild bibasilar crackles, no significant wheezes Abdomen: Bowel Sound present, Soft and no tenderness,  Skin: Bilateral upper and lower extremity dry scabs, healing well.   Extremities: mild Pedal edema, no calf tenderness Neurologic: without any new focal findings Gait not checked due to patient safety concerns  Vitals:   07/22/21 1300 07/22/21 1315 07/22/21 1330 07/22/21 1345  BP: 112/66 133/83 131/87 131/79  Pulse: (!) 110 (!)  119  (!) 104  Resp:      Temp:      TempSrc:      SpO2: (!) 72% 98%  (!) 81%  Weight:      Height:        Intake/Output Summary (Last 24 hours) at 07/22/2021 1456 Last data filed at 07/22/2021 0005 Gross per 24 hour  Intake 718.33 ml  Output --  Net 718.33 ml   Filed Weights   07/20/21 1808 07/20/21 2135 07/22/21 1045  Weight: 77.6 kg 76.3 kg 73.9 kg    Data Reviewed: I have personally reviewed and interpreted daily labs, tele strips, imagings as discussed above. I reviewed all nursing notes, pharmacy notes,  vitals, pertinent old records I have discussed plan of care as described above with RN and patient/family.  CBC: Recent Labs  Lab 07/17/21 0110 07/17/21 1232 07/18/21 0423 07/19/21 0430 07/20/21 0537 07/21/21 0430 07/22/21 0432  WBC 9.1   < > 6.5 8.5 9.2 8.7 10.2  NEUTROABS 7.5  --   --   --   --   --   --   HGB 6.5*   < > 8.2* 8.8* 7.8* 6.9* 8.2*  HCT 20.2*   < > 25.8* 26.2* 23.5* 20.7* 24.6*  MCV 94.8   < > 94.2 91.9 92.5 91.6 91.4  PLT 130*   < > 120* 145* 148* 133* 141*   < > = values in this interval not displayed.   Basic Metabolic Panel: Recent Labs  Lab 07/18/21 0423 07/19/21 0430 07/19/21 1644 07/20/21 0537 07/21/21 0430 07/22/21 0432  NA 138 134*  --  134* 136 137  K 4.7 4.0  --  3.0* 3.7 3.8  CL 98 96*  --  94* 97* 97*  CO2 22 27  --  27 28 29   GLUCOSE 100* 101*  --  105* 120* 89  BUN 67* 43*  --  32* 24* 34*  CREATININE 8.28* 5.81*  --  4.81* 3.78* 5.05*  CALCIUM 8.4* 8.1*  --  7.7* 8.2* 8.6*  MG 2.0 1.7 2.0 1.6* 1.8 2.0  PHOS 8.5* 6.7*  --  4.3 2.9 3.9    Studies: No results found.  Scheduled Meds:  (feeding supplement) PROSource Plus  30 mL Oral BID BM   sodium chloride   Intravenous Once   Chlorhexidine Gluconate Cloth  6 each Topical Q0600   diphenhydrAMINE       docusate  100 mg Per Tube BID   famotidine  20 mg Oral Daily   ipratropium-albuterol  3 mL Nebulization TID   magnesium oxide  400 mg Oral BID   melatonin  5 mg Oral QHS   multivitamin  1 tablet Oral QHS   nutrition supplement (JUVEN)  1 packet Oral BID BM   permethrin   Topical Daily   polyethylene glycol  17 g Per Tube Daily   triamcinolone cream   Topical BID   Continuous Infusions:  sodium chloride     sodium chloride     albumin human     ceFEPime (MAXIPIME) IV 1 g (07/21/21 2158)   PRN Meds: sodium chloride, sodium chloride, acetaminophen **OR** acetaminophen, alteplase, benzonatate, camphor-menthol, diphenhydrAMINE, diphenhydrAMINE-zinc acetate, fentaNYL,  guaiFENesin-dextromethorphan, heparin, hydrOXYzine, lidocaine (PF), lidocaine-prilocaine, menthol-cetylpyridinium, ondansetron **OR** ondansetron (ZOFRAN) IV, oxyCODONE-acetaminophen, pentafluoroprop-tetrafluoroeth  Time spent: 35 minutes  Author: Val Riles. MD [facility] 07/22/2021 2:56 PM  To reach On-call, see care teams to locate the attending and reach out to them via [email or web address]. If 7PM-7AM, please  contact night-coverage If you still have difficulty reaching the attending provider, please page the [facility] (Director on Call) for [facility] on amion for assistance.

## 2021-07-22 NOTE — Progress Notes (Signed)
Patient informed charge RN of family emergency and he needed to leave. Dr. Dwyane Dee notified of patient desire to leave against medical advise. MD came to patient bedside to discuss the importance of having his temporary HD cath access removed prior to leaving. IV team order placed for HD cath removal. Patient signed AMA document. Patient currently awaiting personal transportation.

## 2021-07-22 NOTE — Progress Notes (Signed)
Patient with scheduled 3.5 hr. dialysis session via left neck CVC, tolerated session well. Maintained prescribed BFR and DFR. reached targeted UF without side effects of cramping or hypotension. Dressing changed although patient less than cooperative, may need reinforcement. Patient adamant about leaving, was encouraged to commit to staying this hospitalization to complete testing and proper vascular access placement. Patient was returned to his assigned room.

## 2021-07-22 NOTE — Progress Notes (Signed)
[facility]  ROUNDING NOTE   Subjective:   Jordan [name] is a 55 year old male with end stage renal disease on hemodialysis, alcohol abuse, atrial fibrillation, COPD.   Patient left AMA on 10/25,returned with worsening left AVF infection. Patient underwent left AVF removal by Dr. Delana Meyer on 10/26.   Patient seen and evaluated during dialysis   HEMODIALYSIS FLOWSHEET:  Blood Flow Rate (mL/min): 400 mL/min Arterial Pressure (mmHg): -130 mmHg Venous Pressure (mmHg): 160 mmHg Transmembrane Pressure (mmHg): 70 mmHg Ultrafiltration Rate (mL/min): 670 mL/min Dialysate Flow Rate (mL/min): 500 ml/min Conductivity: Machine : 13.9 Conductivity: Machine : 13.9  Complains of pain and discomfort on left side   Objective:  Vital signs in last 24 hours:  Temp:  [97.8 F (36.6 C)-98.6 F (37 C)] 98.6 F (37 C) (10/31 0732) Pulse Rate:  [88-108] 97 (10/31 0812) Resp:  [18-20] 18 (10/31 0812) BP: (138-158)/(91-131) 150/107 (10/31 0734) SpO2:  [81 %-100 %] 100 % (10/31 0812)  Weight change:  Filed Weights   07/19/21 1301 07/20/21 1808 07/20/21 2135  Weight: 76.3 kg 77.6 kg 76.3 kg     Intake/Output: I/O last 3 completed shifts: In: 848.3 [P.O.:360; Blood:358.3; IV Piggyback:130] Out: 1500 [Other:1500]   Intake/Output this shift:  No intake/output data recorded.  Physical Exam: General: Awake, alert, NAD  Head: Normocephalic,atraumatic  Lungs:  Diminished at the bases, respirations even,unlabored,on O2  Heart: Tachycardic    Abdomen:  Soft, nontender, distended  Extremities:  + Dependent peripheral edema  Neurologic: Able to answer simple questions  Skin: Left arm dressings,scratch marks on arms and legs  Access: Lt IJ temp cath placed on 89/37/34    Basic Metabolic Panel: Recent Labs  Lab 07/18/21 0423 07/19/21 0430 07/19/21 1644 07/20/21 0537 07/21/21 0430 07/22/21 0432  NA 138 134*  --  134* 136 137  K 4.7 4.0  --  3.0* 3.7 3.8  CL 98 96*   --  94* 97* 97*  CO2 22 27  --  27 28 29   GLUCOSE 100* 101*  --  105* 120* 89  BUN 67* 43*  --  32* 24* 34*  CREATININE 8.28* 5.81*  --  4.81* 3.78* 5.05*  CALCIUM 8.4* 8.1*  --  7.7* 8.2* 8.6*  MG 2.0 1.7 2.0 1.6* 1.8 2.0  PHOS 8.5* 6.7*  --  4.3 2.9 3.9     Liver Function Tests: Recent Labs  Lab 07/15/21 1503 07/17/21 0110 07/17/21 2353  AST  --  16 13*  ALT  --  12 10  ALKPHOS  --  117 102  BILITOT  --  0.9 1.3*  PROT  --  6.6 6.2*  ALBUMIN 2.7* 2.6* 2.4*    No results for input(s): LIPASE, AMYLASE in the last 168 hours.  Recent Labs  Lab 07/17/21 2353  AMMONIA <10     CBC: Recent Labs  Lab 07/17/21 0110 07/17/21 1232 07/18/21 0423 07/19/21 0430 07/20/21 0537 07/21/21 0430 07/22/21 0432  WBC 9.1   < > 6.5 8.5 9.2 8.7 10.2  NEUTROABS 7.5  --   --   --   --   --   --   HGB 6.5*   < > 8.2* 8.8* 7.8* 6.9* 8.2*  HCT 20.2*   < > 25.8* 26.2* 23.5* 20.7* 24.6*  MCV 94.8   < > 94.2 91.9 92.5 91.6 91.4  PLT 130*   < > 120* 145* 148* 133* 141*   < > = values in this interval not displayed.  Cardiac Enzymes: No results for input(s): CKTOTAL, CKMB, CKMBINDEX, TROPONINI in the last 168 hours.  BNP: Invalid input(s): POCBNP  CBG: Recent Labs  Lab 07/17/21 2014  GLUCAP 76     Microbiology: Results for orders placed or performed during the hospital encounter of 07/16/21  Blood Culture (routine x 2)     Status: None   Collection Time: 07/17/21  1:10 AM   Specimen: BLOOD  Result Value Ref Range Status   Specimen Description BLOOD RIGHT HAND  Final   Special Requests   Final    BOTTLES DRAWN AEROBIC AND ANAEROBIC Blood Culture results may not be optimal due to an excessive volume of blood received in culture bottles   Culture   Final    NO GROWTH 5 DAYS Performed at [facility], [address] [place], [place] [address]    Report Status 07/22/2021 FINAL  Final  Blood Culture (routine x 2)     Status: None   Collection Time: 07/17/21   1:10 AM   Specimen: BLOOD  Result Value Ref Range Status   Specimen Description BLOOD RIGHT ASSIST CONTROL  Final   Special Requests   Final    BOTTLES DRAWN AEROBIC AND ANAEROBIC Blood Culture adequate volume   Culture   Final    NO GROWTH 5 DAYS Performed at [facility], [address] [place], [place] [address]    Report Status 07/22/2021 FINAL  Final  Resp Panel by RT-PCR (Flu A&B, Covid) Nasopharyngeal Swab     Status: None   Collection Time: 07/17/21  1:10 AM   Specimen: Nasopharyngeal Swab; Nasopharyngeal(NP) swabs in vial transport medium  Result Value Ref Range Status   SARS Coronavirus 2 by RT PCR NEGATIVE NEGATIVE Final    Comment: (NOTE) SARS-CoV-2 target nucleic acids are NOT DETECTED.  The SARS-CoV-2 RNA is generally detectable in upper respiratory specimens during the acute phase of infection. The lowest concentration of SARS-CoV-2 viral copies this assay can detect is 138 copies/mL. A negative result does not preclude SARS-Cov-2 infection and should not be used as the sole basis for treatment or other patient management decisions. A negative result may occur with  improper specimen collection/handling, submission of specimen other than nasopharyngeal swab, presence of viral mutation(s) within the areas targeted by this assay, and inadequate number of viral copies(<138 copies/mL). A negative result must be combined with clinical observations, patient history, and epidemiological information. The expected result is Negative.  Fact Sheet for Patients:  EntrepreneurPulse.com.au  Fact Sheet for Healthcare Providers:  IncredibleEmployment.be  This test is no t yet approved or cleared by the [place] FDA and  has been authorized for detection and/or diagnosis of SARS-CoV-2 by FDA under an Emergency Use Authorization (EUA). This EUA will remain  in effect (meaning this test can be used) for the duration of  the COVID-19 declaration under Section 564(b)(1) of the Act, 21 U.S.C.section 360bbb-3(b)(1), unless the authorization is terminated  or revoked sooner.       Influenza A by PCR NEGATIVE NEGATIVE Final   Influenza B by PCR NEGATIVE NEGATIVE Final    Comment: (NOTE) The Xpert Xpress SARS-CoV-2/FLU/RSV plus assay is intended as an aid in the diagnosis of influenza from Nasopharyngeal swab specimens and should not be used as a sole basis for treatment. Nasal washings and aspirates are unacceptable for Xpert Xpress SARS-CoV-2/FLU/RSV testing.  Fact Sheet for Patients: EntrepreneurPulse.com.au  Fact Sheet for Healthcare Providers: IncredibleEmployment.be  This test is not yet approved or cleared  by the [place] and has been authorized for detection and/or diagnosis of SARS-CoV-2 by FDA under an Emergency Use Authorization (EUA). This EUA will remain in effect (meaning this test can be used) for the duration of the COVID-19 declaration under Section 564(b)(1) of the Act, 21 U.S.C. section 360bbb-3(b)(1), unless the authorization is terminated or revoked.  Performed at [facility], [address], [place], [place] [address]   Aerobic/Anaerobic Culture w Gram Stain (surgical/deep wound)     Status: None (Preliminary result)   Collection Time: 07/17/21  1:10 AM   Specimen: Left Antecubital; Wound  Result Value Ref Range Status   Specimen Description   Final    LEFT ANTECUBITAL Performed at [facility], [address], [place], [place] [address]    Special Requests   Final    NONE Performed at [facility], [place], [place] [address]    Gram Stain   Final    NO SQUAMOUS EPITHELIAL CELLS SEEN MODERATE WBC SEEN RARE GRAM NEGATIVE RODS Performed at [facility], [place] [address] [place], [place] [address]    Culture   Final    MODERATE SERRATIA MARCESCENS NO ANAEROBES ISOLATED;  CULTURE IN PROGRESS FOR 5 DAYS    Report Status PENDING  Incomplete   Organism ID, Bacteria SERRATIA MARCESCENS  Final      Susceptibility   Serratia marcescens - MIC*    CEFAZOLIN >=64 RESISTANT Resistant     CEFEPIME <=0.12 SENSITIVE Sensitive     CEFTAZIDIME <=1 SENSITIVE Sensitive     CEFTRIAXONE <=0.25 SENSITIVE Sensitive     CIPROFLOXACIN <=0.25 SENSITIVE Sensitive     GENTAMICIN <=1 SENSITIVE Sensitive     TRIMETH/SULFA <=20 SENSITIVE Sensitive     * MODERATE SERRATIA MARCESCENS  Aerobic/Anaerobic Culture w Gram Stain (surgical/deep wound)     Status: None (Preliminary result)   Collection Time: 07/17/21  5:30 PM   Specimen: PATH Other; Tissue  Result Value Ref Range Status   Specimen Description   Final    WOUND Performed at [facility], [address] [place], [place] [address]    Special Requests   Final    LEFT UPPER ARM Performed at [facility], [address] [place], [place] [address]    Gram Stain   Final    RARE WBC PRESENT, PREDOMINANTLY PMN NO ORGANISMS SEEN Performed at [facility], [place] [address], [place],  [address]    Culture   Final    MODERATE SERRATIA MARCESCENS NO ANAEROBES ISOLATED; CULTURE IN PROGRESS FOR 5 DAYS    Report Status PENDING  Incomplete   Organism ID, Bacteria SERRATIA MARCESCENS  Final      Susceptibility   Serratia marcescens - MIC*    CEFAZOLIN >=64 RESISTANT Resistant     CEFEPIME <=0.12 SENSITIVE Sensitive     CEFTAZIDIME <=1 SENSITIVE Sensitive     CEFTRIAXONE <=0.25 SENSITIVE Sensitive     CIPROFLOXACIN <=0.25 SENSITIVE Sensitive     GENTAMICIN <=1 SENSITIVE Sensitive     TRIMETH/SULFA <=20 SENSITIVE Sensitive     * MODERATE SERRATIA MARCESCENS  Aerobic/Anaerobic Culture w Gram Stain (surgical/deep wound)     Status: None (Preliminary result)   Collection Time: 07/17/21  5:50 PM   Specimen: PATH Other; Tissue  Result Value Ref Range Status   Specimen Description WOUND  Final    Special Requests LEFT UPPER ARM  Final   Gram Stain   Final  FEW WBC PRESENT, PREDOMINANTLY PMN FEW GRAM NEGATIVE RODS    Culture   Final    MODERATE SERRATIA MARCESCENS NO ANAEROBES ISOLATED; CULTURE IN PROGRESS FOR 5 DAYS    Report Status PENDING  Incomplete   Organism ID, Bacteria SERRATIA MARCESCENS  Final      Susceptibility   Serratia marcescens - MIC*    CEFAZOLIN >=64 RESISTANT Resistant     CEFEPIME <=0.12 SENSITIVE Sensitive     CEFTAZIDIME <=1 SENSITIVE Sensitive     CEFTRIAXONE <=0.25 SENSITIVE Sensitive     CIPROFLOXACIN <=0.25 SENSITIVE Sensitive     GENTAMICIN <=1 SENSITIVE Sensitive     TRIMETH/SULFA Value in next row Sensitive      <=20 SENSITIVEPerformed at [facility] [address], [place], [place] [address]    * MODERATE SERRATIA MARCESCENS  CULTURE, BLOOD (ROUTINE X 2) w Reflex to ID Panel     Status: None (Preliminary result)   Collection Time: 07/19/21  1:02 PM   Specimen: BLOOD  Result Value Ref Range Status   Specimen Description BLOOD BLOOD RIGHT HAND  Final   Special Requests   Final    BOTTLES DRAWN AEROBIC AND ANAEROBIC Blood Culture adequate volume   Culture   Final    NO GROWTH 3 DAYS Performed at [facility], [address], [place], [place] [address]    Report Status PENDING  Incomplete  CULTURE, BLOOD (ROUTINE X 2) w Reflex to ID Panel     Status: None (Preliminary result)   Collection Time: 07/19/21  2:11 PM   Specimen: BLOOD  Result Value Ref Range Status   Specimen Description BLOOD BLOOD RIGHT HAND  Final   Special Requests   Final    BOTTLES DRAWN AEROBIC ONLY Blood Culture results may not be optimal due to an inadequate volume of blood received in culture bottles   Culture   Final    NO GROWTH 3 DAYS Performed at [facility], [address], [place], [place] [address]    Report Status PENDING  Incomplete  Cath Tip Culture     Status: Abnormal (Preliminary result)   Collection Time: 07/19/21  5:57  PM   Specimen: Catheter Tip; Other  Result Value Ref Range Status   Specimen Description   Final    CATH TIP Performed at [facility], [address], [place], [place] [address]    Special Requests   Final    NONE Performed at [facility], [address] [place], [place] [address]    Culture (A)  Final    30,000 COLONIES/mL STAPHYLOCOCCUS EPIDERMIDIS SUSCEPTIBILITIES TO FOLLOW Performed at [facility], [place] [address], [place], [place] [address]    Report Status PENDING  Incomplete  CULTURE, BLOOD (ROUTINE X 2) w Reflex to ID Panel     Status: None (Preliminary result)   Collection Time: 07/19/21  7:03 PM   Specimen: BLOOD  Result Value Ref Range Status   Specimen Description BLOOD RFOA  Final   Special Requests   Final    BOTTLES DRAWN AEROBIC AND ANAEROBIC Blood Culture adequate volume   Culture   Final    NO GROWTH 3 DAYS Performed at [facility], [address] [place], [place] [address]    Report Status PENDING  Incomplete  CULTURE, BLOOD (ROUTINE X 2) w Reflex to ID Panel     Status: None (Preliminary result)   Collection Time: 07/19/21  7:09 PM   Specimen:  BLOOD  Result Value Ref Range Status   Specimen Description BLOOD BLRA  Final   Special Requests   Final    BOTTLES DRAWN AEROBIC AND ANAEROBIC Blood Culture results may not be optimal due to an excessive volume of blood received in culture bottles   Culture   Final    NO GROWTH 3 DAYS Performed at [facility], [address] [place], [place] [address]    Report Status PENDING  Incomplete    Coagulation Studies: No results for input(s): LABPROT, INR in the last 72 hours.   Urinalysis: No results for input(s): COLORURINE, LABSPEC, PHURINE, GLUCOSEU, HGBUR, BILIRUBINUR, KETONESUR, PROTEINUR, UROBILINOGEN, NITRITE, LEUKOCYTESUR in the last 72 hours.  Invalid input(s): APPERANCEUR    Imaging: DG Chest Port 1 View  Result Date: 07/20/2021 CLINICAL DATA:   Central line placement. EXAM: PORTABLE CHEST 1 VIEW COMPARISON:  Radiograph 07/17/2021.  CT 05/03/2021 FINDINGS: Interval extubation. Left internal jugular central venous catheter tip projects over the upper SVC. Unchanged enlarged cardiopericardial silhouette. Advanced aortic atherosclerosis. Development of pulmonary edema from prior exam. There may be trace left pleural effusion. No pneumothorax. IMPRESSION: 1. Left internal jugular central venous catheter with tip over the upper SVC. No pneumothorax. 2. Unchanged cardiomegaly/enlargement of the cardiopericardial silhouette. Development of pulmonary edema from prior exam. Electronically Signed   By: Keith Rake M.D.   On: 07/20/2021 15:50   ECHOCARDIOGRAM COMPLETE  Result Date: 07/20/2021    ECHOCARDIOGRAM REPORT   Patient Name:   JALEAL SCHLIEP Date of Exam: 07/20/2021 Medical Rec #:  [id number]        Height:       64.0 in Accession #:    [id number]       Weight:       168.2 lb Date of Birth:  Nov 09, 1965        BSA:          1.818 m Patient Age:    76 years         BP:           117/74 mmHg Patient Gender: M                HR:           59 bpm. Exam Location:  [facility] Procedure: 2D Echo, Cardiac Doppler and Color Doppler Indications:     Bacteremia R78.81  History:         Patient has prior history of Echocardiogram examinations. Risk                  Factors:Hypertension.  Sonographer:     Alyse Low Roar Referring Phys:  [place] Diagnosing Phys: Skeet Latch MD IMPRESSIONS  1. Left ventricular ejection fraction, by estimation, is 45 to 50%. The left ventricle has mildly decreased function. The left ventricle has no regional wall motion abnormalities. There is mild concentric left ventricular hypertrophy. Left ventricular diastolic parameters are consistent with Grade II diastolic dysfunction (pseudonormalization). Elevated left ventricular end-diastolic pressure.  2. Right ventricular systolic function is normal. The right  ventricular size is mildly enlarged. There is severely elevated pulmonary artery systolic pressure.  3. Left atrial size was severely dilated.  4. Right atrial size was severely dilated.  5. There is no evidence of cardiac tamponade.  6. The mitral valve is degenerative. Mild to moderate mitral valve regurgitation. No evidence of mitral stenosis. Moderate mitral annular calcification.  7. Tricuspid valve regurgitation is severe.  8. The aortic valve is  tricuspid. There is mild calcification of the aortic valve. There is mild thickening of the aortic valve. Aortic valve regurgitation is not visualized. No aortic stenosis is present.  9. The inferior vena cava is dilated in size with >50% respiratory variability, suggesting right atrial pressure of 8 mmHg. Comparison(s): Compared with the echo 06/03/21, the pericardial effusion is larger. There is no tamponade. FINDINGS  Left Ventricle: Left ventricular ejection fraction, by estimation, is 45 to 50%. The left ventricle has mildly decreased function. The left ventricle has no regional wall motion abnormalities. The left ventricular internal cavity size was normal in size. There is mild concentric left ventricular hypertrophy. Left ventricular diastolic parameters are consistent with Grade II diastolic dysfunction (pseudonormalization). Elevated left ventricular end-diastolic pressure. Right Ventricle: The right ventricular size is mildly enlarged. No increase in right ventricular wall thickness. Right ventricular systolic function is normal. There is severely elevated pulmonary artery systolic pressure. The tricuspid regurgitant velocity is 4.10 m/s, and with an assumed right atrial pressure of 8 mmHg, the estimated right ventricular systolic pressure is 68.0 mmHg. Left Atrium: Left atrial size was severely dilated. Right Atrium: Right atrial size was severely dilated. Pericardium: Trivial pericardial effusion is present. There is no evidence of cardiac tamponade.  Mitral Valve: The mitral valve is degenerative in appearance. There is moderate thickening of the mitral valve leaflet(s). There is moderate calcification of the mitral valve leaflet(s). Moderate mitral annular calcification. Mild to moderate mitral valve regurgitation. No evidence of mitral valve stenosis. Tricuspid Valve: The tricuspid valve is normal in structure. Tricuspid valve regurgitation is severe. No evidence of tricuspid stenosis. Aortic Valve: The aortic valve is tricuspid. There is mild calcification of the aortic valve. There is mild thickening of the aortic valve. Aortic valve regurgitation is not visualized. No aortic stenosis is present. Aortic valve peak gradient measures 8.5 mmHg. Pulmonic Valve: The pulmonic valve was normal in structure. Pulmonic valve regurgitation is trivial. No evidence of pulmonic stenosis. Aorta: The aortic root is normal in size and structure. Venous: The inferior vena cava is dilated in size with greater than 50% respiratory variability, suggesting right atrial pressure of 8 mmHg. IAS/Shunts: No atrial level shunt detected by color flow Doppler. Additional Comments: There is pleural effusion in the right lateral region.  LEFT VENTRICLE PLAX 2D LVIDd:         4.70 cm      Diastology LVIDs:         3.70 cm      LV e' medial:    5.11 cm/s LV PW:         1.30 cm      LV E/e' medial:  23.9 LV IVS:        1.30 cm      LV e' lateral:   9.25 cm/s LVOT diam:     1.70 cm      LV E/e' lateral: 13.2 LVOT Area:     2.27 cm  LV Volumes (MOD) LV vol d, MOD A2C: 105.0 ml LV vol d, MOD A4C: 113.0 ml LV vol s, MOD A2C: 64.0 ml LV vol s, MOD A4C: 65.9 ml LV SV MOD A2C:     41.0 ml LV SV MOD A4C:     113.0 ml LV SV MOD BP:      41.7 ml RIGHT VENTRICLE RV Basal diam:  4.70 cm RV Mid diam:    3.70 cm RV S prime:     8.70 cm/s TAPSE (M-mode): 1.0 cm LEFT ATRIUM  Index        RIGHT ATRIUM           Index LA diam:        4.40 cm 2.42 cm/m   RA Area:     24.50 cm LA Vol (A2C):   88.8  ml 48.86 ml/m  RA Volume:   85.70 ml  47.15 ml/m LA Vol (A4C):   60.4 ml 33.23 ml/m LA Biplane Vol: 73.0 ml 40.16 ml/m  AORTIC VALVE                 PULMONIC VALVE AV Area (Vmax): 1.91 cm     PV Vmax:          0.96 m/s AV Vmax:        146.00 cm/s  PV Peak grad:     3.7 mmHg AV Peak Grad:   8.5 mmHg     PR End Diast Vel: 13.25 msec LVOT Vmax:      123.00 cm/s  RVOT Peak grad:   1 mmHg  AORTA Ao Root diam: 2.90 cm MITRAL VALVE                TRICUSPID VALVE MV Area (PHT): 5.54 cm     TR Peak grad:   67.2 mmHg MV Decel Time: 137 msec     TR Vmax:        410.00 cm/s MV E velocity: 122.00 cm/s MV A velocity: 93.80 cm/s   SHUNTS MV E/A ratio:  1.30         Systemic Diam: 1.70 cm MV A Prime:    6.7 cm/s Skeet Latch MD Electronically signed by Skeet Latch MD Signature Date/Time: 07/20/2021/3:09:33 PM    Final      Medications:    sodium chloride     sodium chloride     albumin human     ceFEPime (MAXIPIME) IV 1 g (07/21/21 2158)      (feeding supplement) PROSource Plus  30 mL Oral BID BM   sodium chloride   Intravenous Once   Chlorhexidine Gluconate Cloth  6 each Topical Q0600   docusate  100 mg Per Tube BID   famotidine  20 mg Oral Daily   ipratropium-albuterol  3 mL Nebulization TID   magnesium oxide  400 mg Oral BID   melatonin  5 mg Oral QHS   multivitamin  1 tablet Oral QHS   nutrition supplement (JUVEN)  1 packet Oral BID BM   polyethylene glycol  17 g Per Tube Daily   triamcinolone cream   Topical BID      Assessment/ Plan:  [name] is a 55 y.o. black male with end stage renal disease on hemodialysis, hypertension, COPD, EtOH abuse, atrial fibrillation, congestive heart failure who is admitted to [facility] on 07/16/2021 for Bacteremia [R78.81] ESRD (end stage renal disease) ([place]) [N18.6] Wound infection [T14.8XXA, L08.9] ESRD on hemodialysis ([place]) [N18.6, Z99.2] Acute on chronic anemia [D64.9] ABLA (acute blood loss anemia) [D62]   [facility] MWF  [address] Left AVF 67kg   #End Stage Renal Disease on dialysis with fluid volume overload. With complication of dialysis device. Status post removal of infected left arm AVF on 10/26 by Dr. Delana Meyer.  Receiving dialysis today, tolerating well. UF goal 2.5L as tolerated Albumin given during treatment Next treatment scheduled for Wednesday  #Secondary Hyperparathyroidism:  Lab Results  Component Value Date   PTH 669 (H) 02/20/2020   CALCIUM 8.6 (L) 07/22/2021   CAION  1.12 (L) 06/02/2021   PHOS 3.9 07/22/2021   Will continue monitoring bone mineral metabolism parameters closely Phosphorus at goal and calcium slightly below target  #Anemia with chronic kidney disease: with acute blood loss anemia. Status post PRBC transfusion on 10/26.  Lab Results  Component Value Date   HGB 8.2 (L) 07/22/2021  EPOGEN with MWF HD treatments.   # Sepsis with bacteremia: blood cultures on 10/24 with serratia species. Source seems to be AVF with superimposed cellulitis.  Continue Cefepime. ID following    LOS: 5   10/31/20229:38 AM

## 2021-07-22 NOTE — TOC Initial Note (Signed)
Transition of Care [facility]) - Initial/Assessment Note    Patient Details  Name: Jordan [name] MRN: [id number] Date of Birth: 25-Jul-1966  Transition of Care [facility]) CM/SW Contact:    Jordan Chroman, LCSW Phone Number: 07/22/2021, 1:54 PM  Clinical Narrative:  Readmission prevention screen complete. CSW met with patient. No supports at bedside. CSW introduced role and explained that discharge planning would be discussed. Patient PCP was previous Jordan Silversmith, NP at [facility] but she is no longer a provider there. They were able to transfer him to Jordan Garret, NP. He will have to be seen at their office at [facility] in [place] until the construction at [facility] is complete at the end of December/beginning of January. Patient does not have transportation to appointments and dialysis. Emailed referral to [facility]. This will be at no cost to patient but requires compliance. Patient goes to [facility]. No issues obtaining medications. No home health or DME use other than prn oxygen prior to admission. Patient gets his oxygen through [employer]. Patient asked about assistance with his rent and utilities. Got phone number to apply for Energy/Water Assistance through [employer]. Will give to patient when he returns from HD. No further concerns. CSW encouraged patient to contact CSW as needed. CSW will continue to follow patient for support and facilitate return home when stable.  Expected Discharge Plan: Home/Self Care Barriers to Discharge: Continued Medical Work up   Patient Goals and CMS Choice        Expected Discharge Plan and Services Expected Discharge Plan: Home/Self Care     Post Acute Care Choice: NA Living arrangements for the past 2 months: Single Family Home                                      Prior Living Arrangements/Services Living arrangements for the past 2 months: Single Family Home Lives with::  Self Patient language and need for interpreter reviewed:: Yes Do you feel safe going back to the place where you live?: Yes      Need for Family Participation in Patient Care: Yes (Comment) Care giver support system in place?: Yes (comment)   Criminal Activity/Legal Involvement Pertinent to Current Situation/Hospitalization: No - Comment as needed  Activities of Daily Living Home Assistive Devices/Equipment: None ADL Screening (condition at time of admission) Patient's cognitive ability adequate to safely complete daily activities?: Yes Is the patient deaf or have difficulty hearing?: No Does the patient have difficulty seeing, even when wearing glasses/contacts?: No Does the patient have difficulty concentrating, remembering, or making decisions?: No Patient able to express need for assistance with ADLs?: Yes Does the patient have difficulty dressing or bathing?: No Independently performs ADLs?: Yes (appropriate for developmental age) Does the patient have difficulty walking or climbing stairs?: No Weakness of Legs: None Weakness of Arms/Hands: None  Permission Sought/Granted                  Emotional Assessment Appearance:: Appears stated age Attitude/Demeanor/Rapport: Engaged, Gracious Affect (typically observed): Accepting, Appropriate, Calm, Pleasant Orientation: : Oriented to Self, Oriented to Place, Oriented to  Time, Oriented to Situation Alcohol / Substance Use: Not Applicable Psych Involvement: No (comment)  Admission diagnosis:  Bacteremia [R78.81] ESRD (end stage renal disease) ([place]) [N18.6] Wound infection [T14.8XXA, L08.9] ESRD on hemodialysis ([place]) [N18.6, Z99.2] Acute on chronic anemia [D64.9] ABLA (acute blood loss anemia) [  D62] Patient Active Problem List   Diagnosis Date Noted   ABLA (acute blood loss anemia) 07/17/2021   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 ([place]) 07/17/2021   AV fistula infection, subsequent encounter  07/17/2021   Bacteremia 07/17/2021   AV fistula thrombosis, 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/17/2021   Pressure injury of skin 07/15/2021   Acute on chronic respiratory failure with hypoxemia ([place]) 07/12/2021   Ascites    Protein-calorie malnutrition, severe 06/05/2021   Hypervolemia associated with renal insufficiency 06/02/2021   Symptomatic anemia 05/28/2021   Acute on chronic diastolic CHF (congestive heart failure) ([place]) 05/28/2021   Hyperkalemia 05/28/2021   Pulmonary edema 04/29/2021   Pruritus 04/29/2021   COVID-19 virus infection 04/29/2021   Thrombocytopenia ([place]) 04/08/2021   COPD (chronic obstructive pulmonary disease) ([place]) 04/08/2021   CHF (congestive heart failure) ([place]) 03/25/2021   PAF (paroxysmal atrial fibrillation) ([place]) 03/20/2021   Chronic diastolic CHF (congestive heart failure) ([place]) 03/20/2021   Alcohol abuse 03/20/2021   Vision loss of left eye 03/20/2021   SOB (shortness of breath) 03/20/2021   Shortness of breath 03/10/2021   Abscess of buttock 03/02/2021   Fluid overload 03/02/2021   Multifocal pneumonia 01/13/2021   Current mild episode of major depressive disorder without prior episode ([place]) 04/09/2020   Atrial fibrillation with RVR ([place]) 04/04/2020   Non-compliance with renal dialysis ([place]) 03/21/2020   Acute CHF (congestive heart failure) ([place]) 03/12/2020   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 ([place]) 02/03/2020   Acute on chronic combined systolic and diastolic CHF (congestive heart failure) ([place]) 02/03/2020   Acute on chronic respiratory failure with hypoxia ([place]) 11/12/2019   Aortic atherosclerosis ([place]) 11/12/2019   Emphysema of lung ([place]) 11/12/2019   Volume overload 10/05/2019   Elevated troponin 05/17/2018   Anemia in ESRD (end-stage renal disease) ([place]) 05/17/2018   ESRD on dialysis ([place]) 05/26/2017   Essential hypertension 05/26/2017   Moderate protein-calorie malnutrition ([place]) 08/13/2016   Secondary hyperparathyroidism of  renal origin ([place]) 08/13/2016   Tobacco abuse 08/04/2016   PCP:  Hoy Register, MD Pharmacy:   [facility] [place], [place] - [address] [place] [address] Phone: [phone] Fax: [phone]  [facility] [place], [place] - [address], [address] [place] [address] Phone: [phone] Fax: [phone]     Social Determinants of Health (SDOH) Interventions    Readmission Risk Interventions Readmission Risk Prevention Plan 07/22/2021  Transportation Screening Complete  Medication Review ([place]) Complete  PCP or Specialist appointment within 3-5 days of discharge Complete  SW Recovery Care/Counseling Consult Complete  [place] Not Applicable  Some recent data might be hidden

## 2021-07-22 NOTE — Discharge Summary (Signed)
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 Go on 08/13/2021.   Specialty: Behavioral Health Why: New primary care appointment with [name], NP at 9:20 am. They are seeing patients in [place] until end of December/beginning of January when [facility] is complete. Contact information: [address] [place]  [id number] [phone]               Diet recommendation: Renal diet  Activity: The patient is advised to gradually reintroduce usual activities, as tolerated  Discharge Condition: stable  Code Status: Full code   History of present illness: As per the H and P dictated on admission  Hospital Course:  Jordan [name] is a 55 y.o. male with medical history significant for ESRD on HD MWF, alcohol abuse, COPD, medication and dialysis noncompliance with frequent hospitalizations for fluid overload, who presents to the hospital by EMS due to bleeding from his left arm dialysis fistula for the past 1 hour that has continued and spite of pressure dressings applied by EMS.,  Patient was recently hospitalized on 10/21 for acute pulmonary edema  secondary to missing dialysis having to be intubated for severe respiratory and uremic encephalopathy.  He underwent fistulogram on 10/24 and found to have a thrombosed AV fistula and started on a heparin infusion.  He signed out [place] on 10/24 with a femoral catheter in situ.  He was apparently last dialyzed on 10/25 following which the femoral catheter was pulled.  On the same night he returns to the ED due to bleeding from the left AV fistula. He is denying shortness of breath or chest pain, fever or chills. Of note patient did have positive blood cultures from 10/24 that showed Enterobacterales and Serratia marcescens .  He was seen by ID who recommended cefepime and repeating blood cultures until clearance of bacteremia   ED course: Upon arrival, AV graft had stopped bleeding, per ED provider.  Vitals were stable with BP 117/80, pulse 91 O2 sat 100% and temp 98.6 Blood work: Hemoglobin 6.5, down from 8.4 on 10/24 platelets 130 which is above baseline WBC 9.1 with lactic acid 1.5   EKG, personally viewed and interpreted: Sinus at 92 with no acute ST-T wave changes   Chest x-ray: Mild, stable, diffusely increased interstitial lung markings which are likely, in part chronic in nature.  A mild superimposed component of interstitial edema cannot be excluded   Patient given cefepime and vancomycin in the ED.  Blood cultures done.  Hospitalist consulted for admission.   Patient was seen by vascular surgery, patient was taken to the OR on 10/26 for removal of infected left brachial basilic AV fistula and insertion of dialysis catheter  after left femoral vein.  Patient was not successfully extubated after the procedure and he was reintubated and transferred to ICU.   Patient was extubated on 10/28 and transferred under [facility] hospitalist service     Assessment and Plan:   # Respiratory failure s/p intubation after the procedure of AV fistula removal, unsuccessful extubation, patient was reintubated and  remained in the ICU, extubated on 10/28, Currently patient is saturating well on room air     # ABLA (acute blood loss anemia)   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 ([place]) - Patient presents with bleeding of left AV fistula with hemoglobin 6.5, down from 8.4 in the setting of recent heparin infusion for thrombosed fistula - Hemostasis achieved after arrival in the ED, patient received 1 unit of PRBC in the ED. Continue monitor H&H and transfuse if hemoglobin less than 7 Vascular surgery consulted, s/p removal of infected AV fistula, left femoral dialysis catheter was inserted for dialysis.  It was removed on 10/28 after dialysis. 10/30 Hb 6.9, patient was given 1 unit of PRBC transfusion       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 - Blood culture from 10/21 with Serratia  - Patient was seen by ID on 10/24,  so reconsulted ID for antibiotics and duration of treatment - Continue cefepime as per ID, need to know duration ?? - repeat blood cultures NGTD       AV fistula thrombosis, S/p removal of AV fistula, dialysis catheter was inserted left femoral vein which was removed after dialysis on 10/28 - Vascular surgery consulted - Patient had femoral dialysis access that was apparently pulled following dialysis on 10/25       ESRD on dialysis  - Nephrology consult for continuation of dialysis once access is placed - Vascular consulted for access   Hypokalemia, potassium repleted.   Hypomagnesemia, mag repleted.     Suspect scabies - Patient with multiple excoriation marks on extremities -- Patient was given permethrin topical application x1 dose -  ID consulted - Contact precautions - Antipruritics 10/31 patient is still having significant pruritus, does not feel any improvement so started permethrin 5% topical daily application for 7 days.  Patient needs to be cleaned before each application    Chronic diastolic CHF (congestive  heart failure) - Patient does not appear acutely overloaded at this time and chest x-ray appears stable - Continue dialysis   COPD (chronic obstructive pulmonary disease)  - Not acutely exacerbated Continue symptomatic treatment for cough Continue oral care, patient was complaining of dry mouth     10/31 patient left AMA Patient was managed as above.  Patient decided to leave after hemodialysis due to some family emergency.  Nephrologist aware, recommended to remove temporary dialysis catheter before discharge.  RN was advised to remove dialysis catheter. Patient left AMA.  Body mass index is 27.97 kg/m.  Nutrition Problem: Moderate Malnutrition Etiology: chronic illness (ESRD on HD, COPD, etoh abuse) Nutrition Interventions: Interventions: MVI, Prostat, Juven  Pressure Injury 07/14/21 Buttocks Mid Stage 2 -  Partial thickness loss of dermis presenting as a shallow open injury with a red, pink wound bed without slough. open area (Active)  07/14/21 1402  Location: Buttocks  Location Orientation: Mid  Staging: Stage 2 -  Partial thickness loss of dermis presenting as a shallow open injury with a red, pink wound bed without slough.  Wound Description (Comments): open area  Present on Admission: Yes     Patient was ambulatory without  any assistance.  Consultants: Nephrology, vascular surgery, ID, pulmonary critical care Procedures: s/p AV fistula graft removal, s/p temporary dialysis catheter  S/p intubation and extubated in the ICU  Discharge Exam: General: Appear in no distress, b/l upper and lower extremity crusted/rash; Oral Mucosa Clear, moist. Cardiovascular: S1 and S2 Present, no Murmur, Respiratory: normal respiratory effort, Bilateral Air entry present and no Crackles, no wheezes Abdomen: Bowel Sound present, Soft and no tenderness, no hernia Extremities: no Pedal edema, no calf tenderness Neurology: alert and oriented to time, place, and person affect  appropriate.  Filed Weights   07/20/21 1808 07/20/21 2135 07/22/21 1045  Weight: 77.6 kg 76.3 kg 73.9 kg   Vitals:   07/22/21 1500 07/22/21 1515  BP: (!) 134/92 123/83  Pulse:    Resp:    Temp:    SpO2:      DISCHARGE MEDICATION:  Patient left AMA  Allergies  Allergen Reactions   Lisinopril Swelling     The results of significant diagnostics from this hospitalization (including imaging, microbiology, ancillary and laboratory) are listed below for reference.    Significant Diagnostic Studies: DG Chest 1 View  Result Date: 06/28/2021 CLINICAL DATA:  55 year old male intubated.  Neck swelling. EXAM: CHEST  1 VIEW COMPARISON:  Portable chest 06/27/2021. FINDINGS: Portable AP supine view at 0412 hours. Calcified aortic atherosclerosis. Cardiomegaly. Other mediastinal contours are within normal limits. Endotracheal tube tip projects just above the clavicles over the trachea. A new right EJ region intravenous access is in place. No lower neck subcutaneous emphysema or other soft tissue abnormality is identified. Stable lung volumes. No pneumothorax. No definite pleural effusion or consolidation. Widespread increased pulmonary interstitium has mildly regressed since 06/26/2021. Paucity of bowel gas in the upper abdomen. No acute osseous abnormality identified. IMPRESSION: 1. ETT tip just above the clavicles, could be advanced 1-2 cm for more optimal placement but otherwise satisfactory. 2. No neck subcutaneous emphysema or explanation for swelling. There is a new superficial right EJ type IV access in place. 3. Cardiomegaly and Aortic Atherosclerosis (ICD10-I70.0). Pulmonary vascular congestion appears regressed since 06/26/2021. 4. No new cardiopulmonary abnormality. Electronically Signed   By: Genevie Ann M.D.   On: 06/28/2021 05:05   DG Chest 1 View  Result Date: 06/27/2021 CLINICAL DATA:  Anaphylactic reaction EXAM: CHEST  1 VIEW COMPARISON:  06/26/2021 FINDINGS: Cardiac size is moderately  enlarged, similar to prior examination and similar to remote prior examination of 04/26/2021. A component of this was related to an underlying pericardial effusion better appreciated on CT examination of 05/03/2021. Superimposed mild to moderate diffuse interstitial pulmonary edema has progressed slightly in the interval since prior examination. No pneumothorax or pleural effusion. Pulmonary insufflation is stable, the lung volumes are small. No acute bone abnormality. IMPRESSION: Stable cardiomegaly, a component which likely relates to an underlying pericardial effusion as better seen on CT examination of 05/03/2021. Progressive mild to moderate pulmonary edema. Pulmonary hypoinflation. Electronically Signed   By: Fidela Salisbury M.D.   On: 06/27/2021 21:29   DG Chest 2 View  Result Date: 07/02/2021 CLINICAL DATA:  Respiratory failure. EXAM: CHEST - 2 VIEW COMPARISON:  06/28/2021 FINDINGS: 0832 hours. Interval extubation. Low volume film. The cardio pericardial silhouette is enlarged. Diffuse interstitial and parahilar alveolar opacity suggests edema. No substantial pleural effusion. Telemetry leads overlie the chest. IMPRESSION: Low volume film with cardiomegaly and pulmonary edema. Electronically Signed   By: Misty Stanley M.D.   On: 07/02/2021 11:35   DG Abd 1 View  Result  Date: 07/03/2021 CLINICAL DATA:  Abdominal pain EXAM: ABDOMEN - 1 VIEW COMPARISON:  None. FINDINGS: There is loss of the properitoneal fat flank stripes and central crowding of aerated loops of bowel suggesting underlying ascites. Underpenetration precludes evaluation of the visceral shadows. Nonobstructive bowel gas pattern. Calcifications within the mid abdomen correspond to renovascular calcifications. Advanced vascular calcifications noted within the pelvis. No gross free intraperitoneal gas. IMPRESSION: Suspected at least moderate ascites. Nonobstructive bowel gas pattern. . Electronically Signed   By: Fidela Salisbury M.D.   On:  07/03/2021 22:56   CT SOFT TISSUE NECK WO CONTRAST  Result Date: 06/28/2021 CLINICAL DATA:  Foreign body suspected, neck, neg xray. Left neck swelling. Clinical concern for angioedema. Renal failure on dialysis. EXAM: CT NECK WITHOUT CONTRAST TECHNIQUE: Multidetector CT imaging of the neck was performed following the standard protocol without intravenous contrast. COMPARISON:  01/14/2021 FINDINGS: Pharynx and larynx: An endotracheal tube is present and terminates at the level of the clavicular heads. There is motion artifact through the larynx, improved with limited repeat imaging. No mass is identified within limitations of noncontrast technique. Mild diffuse edema suspected in the oral cavity and left greater than right oropharynx. There is mild retropharyngeal edema and/or small retropharyngeal effusion. Salivary glands: No salivary stone or gross mass on this unenhanced study. Thyroid: Grossly unremarkable within limitations of motion artifact. Lymph nodes: No grossly enlarged cervical lymph nodes within limitations of noncontrast technique and diffuse edema. Vascular: Widespread atherosclerotic vascular calcification. Limited intracranial: Possible subcentimeter chronic infarct in the right cerebellar hemisphere. Visualized orbits: Unremarkable. Mastoids and visualized paranasal sinuses: Right larger than left mastoid effusions. Visualized paranasal sinuses are clear. Skeleton: Diffusely increased density of the included skeletal structures compatible with renal osteodystrophy. Moderate cervical spondylosis. Upper chest: Limited assessment due to motion. Emphysema and dependent atelectasis bilaterally. Other: Widespread soft tissue edema throughout the neck, overall left greater than right. No organized fluid collection identified on this unenhanced study. IMPRESSION: Widespread edema throughout the soft tissues of the neck, left greater than right. Oral cavity and likely mild oropharyngeal edema with mild  retropharyngeal edema or small retropharyngeal effusion. These findings may reflect angioedema and generalized fluid overload although infection/cellulitis is not excluded. No organized fluid collection identified on this unenhanced study. Electronically Signed   By: Logan Bores M.D.   On: 06/28/2021 08:23   PERIPHERAL VASCULAR CATHETERIZATION  Result Date: 07/15/2021 See surgical note for result.  US Venous Img Lower Bilateral (DVT)  Result Date: 07/05/2021 CLINICAL DATA:  55 year old male with a history bilateral leg pain EXAM: BILATERAL LOWER EXTREMITY VENOUS DOPPLER ULTRASOUND TECHNIQUE: Gray-scale sonography with graded compression, as well as color Doppler and duplex ultrasound were performed to evaluate the lower extremity deep venous systems from the level of the common femoral vein and including the common femoral, femoral, profunda femoral, popliteal and calf veins including the posterior tibial, peroneal and gastrocnemius veins when visible. The superficial great saphenous vein was also interrogated. Spectral Doppler was utilized to evaluate flow at rest and with distal augmentation maneuvers in the common femoral, femoral and popliteal veins. COMPARISON:  None. FINDINGS: RIGHT LOWER EXTREMITY Common Femoral Vein: No evidence of thrombus. Normal compressibility, respiratory phasicity and response to augmentation. Saphenofemoral Junction: No evidence of thrombus. Normal compressibility and flow on color Doppler imaging. Profunda Femoral Vein: No evidence of thrombus. Normal compressibility and flow on color Doppler imaging. Femoral Vein: No evidence of thrombus. Normal compressibility, respiratory phasicity and response to augmentation. Popliteal Vein: No evidence of thrombus. Normal compressibility,  respiratory phasicity and response to augmentation. Calf Veins: No evidence of thrombus. Normal compressibility and flow on color Doppler imaging. Superficial Great Saphenous Vein: No evidence of  thrombus. Normal compressibility and flow on color Doppler imaging. Other Findings:  None. LEFT LOWER EXTREMITY Common Femoral Vein: No evidence of thrombus. Normal compressibility, respiratory phasicity and response to augmentation. Saphenofemoral Junction: No evidence of thrombus. Normal compressibility and flow on color Doppler imaging. Profunda Femoral Vein: No evidence of thrombus. Normal compressibility and flow on color Doppler imaging. Femoral Vein: No evidence of thrombus. Normal compressibility, respiratory phasicity and response to augmentation. Popliteal Vein: No evidence of thrombus. Normal compressibility, respiratory phasicity and response to augmentation. Calf Veins: No evidence of thrombus. Normal compressibility and flow on color Doppler imaging. Superficial Great Saphenous Vein: No evidence of thrombus. Normal compressibility and flow on color Doppler imaging. Other Findings:  None. IMPRESSION: Sonographic survey of the bilateral lower extremities negative for DVT Electronically Signed   By: Corrie Mckusick D.O.   On: 07/05/2021 11:18   [place] Upper Ext Art Left  Result Date: 07/12/2021 CLINICAL DATA:  Left upper extremity AV fistula for hemodialysis access. Absent rule with tenderness. Evaluate for thrombosis EXAM: LEFT UPPER EXTREMITY ARTERIAL DUPLEX SCAN TECHNIQUE: Gray-scale sonography as well as color Doppler and duplex ultrasound was performed to evaluate the hemodialysis AV fistula. COMPARISON:  None. FINDINGS: Targeted interrogation of the patient's arteriovenous fistula at the elbow. The fistula is thrombosed with near occlusive clot. There is no internal blood flow. No focal fluid collection is seen. The remainder of the arteries in the left upper extremity were not interrogated. IMPRESSION: Thrombosed AV fistula in the left arm. These results were called by telephone at the time of interpretation on 07/12/2021 at 7:40 pm to provider PHILLIP STAFFORD , who verbally acknowledged these  results. Electronically Signed   By: Keith Rake M.D.   On: 07/12/2021 19:41   US Paracentesis  Result Date: 07/04/2021 INDICATION: Ascites, request for paracentesis. EXAM: ULTRASOUND GUIDED  PARACENTESIS MEDICATIONS: Lidocaine 1% only. COMPLICATIONS: None immediate. PROCEDURE: Informed written consent was obtained from the patient after a discussion of the risks, benefits and alternatives to treatment. A timeout was performed prior to the initiation of the procedure. Initial ultrasound scanning demonstrates a large amount of ascites within the right lower abdominal quadrant. The right lower abdomen was prepped and draped in the usual sterile fashion. 1% lidocaine was used for local anesthesia. Following this, a 6 Fr Safe-T-Centesis catheter was introduced. An ultrasound image was saved for documentation purposes. The paracentesis was performed. The catheter was removed and a dressing was applied. The patient tolerated the procedure well without immediate post procedural complication. FINDINGS: A total of approximately 5 L of clear yellow fluid was removed. Samples were sent to the laboratory as requested by the clinical team. IMPRESSION: Successful ultrasound-guided paracentesis yielding 5 liters of peritoneal fluid. Read By: Tsosie Billing PA-C Electronically Signed   By: Markus Daft M.D.   On: 07/04/2021 16:41   DG Chest Port 1 View  Result Date: 07/20/2021 CLINICAL DATA:  Central line placement. EXAM: PORTABLE CHEST 1 VIEW COMPARISON:  Radiograph 07/17/2021.  CT 05/03/2021 FINDINGS: Interval extubation. Left internal jugular central venous catheter tip projects over the upper SVC. Unchanged enlarged cardiopericardial silhouette. Advanced aortic atherosclerosis. Development of pulmonary edema from prior exam. There may be trace left pleural effusion. No pneumothorax. IMPRESSION: 1. Left internal jugular central venous catheter with tip over the upper SVC. No pneumothorax. 2. Unchanged  cardiomegaly/enlargement of  the cardiopericardial silhouette. Development of pulmonary edema from prior exam. Electronically Signed   By: Keith Rake M.D.   On: 07/20/2021 15:50   DG Chest Port 1 View  Result Date: 07/17/2021 CLINICAL DATA:  Respiratory failure EXAM: PORTABLE CHEST 1 VIEW COMPARISON:  Chest radiograph dated 07/17/2021. FINDINGS: Endotracheal tube with tip approximately 2.5 cm above the carina. There is cardiomegaly with vascular congestion. No focal consolidation, pleural effusion or pneumothorax. Atherosclerotic calcification of the aorta. No acute osseous pathology. IMPRESSION: Cardiomegaly with vascular congestion. No focal consolidation. Electronically Signed   By: Anner Crete M.D.   On: 07/17/2021 20:47   DG Chest Port 1 View  Result Date: 07/17/2021 CLINICAL DATA:  Bleeding of left arm dialysis fistula. EXAM: PORTABLE CHEST 1 VIEW COMPARISON:  July 15, 2021 FINDINGS: Mild, stable diffusely increased interstitial lung markings are seen. There is no evidence of a pleural effusion or pneumothorax. The cardiac silhouette is markedly enlarged and unchanged in size. Marked severity calcification of the thoracic aorta is noted. The visualized skeletal structures are unremarkable. IMPRESSION: 1. Stable cardiomegaly. 2. Mild, stable, diffusely increased interstitial lung markings, which are likely, in part, chronic in nature. A mild superimposed component of interstitial edema cannot be excluded. Electronically Signed   By: Virgina Norfolk M.D.   On: 07/17/2021 00:42   DG Chest Port 1 View  Result Date: 07/15/2021 CLINICAL DATA:  Chest pain EXAM: PORTABLE CHEST 1 VIEW COMPARISON:  07/12/2021, CT 05/03/2021, chest x-ray 06/14/2021, 05/28/2021, 03/20/2021, 10/01/2019 FINDINGS: Cardiomegaly with globular cardiac configuration. Aortic atherosclerosis. Diffuse bilateral ground-glass opacity. No pleural effusion or pneumothorax. IMPRESSION: 1. Cardiomegaly with globular cardiac  configuration which may be due to combination of chamber enlargement and pericardial effusion. 2. Diffuse bilateral ground-glass opacity, suspect underlying component of chronic lung disease but difficult to exclude mild superimposed edema Electronically Signed   By: Donavan Foil M.D.   On: 07/15/2021 16:03   DG Chest Portable 1 View  Result Date: 07/12/2021 CLINICAL DATA:  Shortness of breath x2 days. EXAM: PORTABLE CHEST 1 VIEW COMPARISON:  July 02, 2021 (8:32 a.m.) FINDINGS: Low lung volumes are seen. Mild, diffusely increased interstitial lung markings are noted. This is stable in appearance when compared to the prior study. There is no evidence of a pleural effusion or pneumothorax. The cardiac silhouette is markedly enlarged. There is marked severity calcification of the thoracic aorta. The visualized skeletal structures are unremarkable. IMPRESSION: 1. Stable cardiomegaly with mild pulmonary edema. Electronically Signed   By: Virgina Norfolk M.D.   On: 07/12/2021 19:20   DG Chest Portable 1 View  Result Date: 06/26/2021 CLINICAL DATA:  Short of breath EXAM: PORTABLE CHEST 1 VIEW COMPARISON:  None. FINDINGS: Stable enlarged cardiac silhouette. There is diffuse mild airspace disease. No focal consolidation. Pleural fluid or pneumothorax. IMPRESSION: Cardiomegaly and pulmonary edema pattern. Potential component of pericardial effusion. Electronically Signed   By: Suzy Bouchard M.D.   On: 06/26/2021 08:35   ECHOCARDIOGRAM COMPLETE  Result Date: 07/20/2021    ECHOCARDIOGRAM REPORT   Patient Name:   BORA BRONER Date of Exam: 07/20/2021 Medical Rec #:  [id number]        Height:       64.0 in Accession #:    [id number]       Weight:       168.2 lb Date of Birth:  01-Mar-1966        BSA:          1.818 m Patient Age:  54 years         BP:           117/74 mmHg Patient Gender: M                HR:           59 bpm. Exam Location:  [facility] Procedure: 2D Echo, Cardiac Doppler and Color Doppler  Indications:     Bacteremia R78.81  History:         Patient has prior history of Echocardiogram examinations. Risk                  Factors:Hypertension.  Sonographer:     Alyse Low Roar Referring Phys:  [place] Diagnosing Phys: Skeet Latch MD IMPRESSIONS  1. Left ventricular ejection fraction, by estimation, is 45 to 50%. The left ventricle has mildly decreased function. The left ventricle has no regional wall motion abnormalities. There is mild concentric left ventricular hypertrophy. Left ventricular diastolic parameters are consistent with Grade II diastolic dysfunction (pseudonormalization). Elevated left ventricular end-diastolic pressure.  2. Right ventricular systolic function is normal. The right ventricular size is mildly enlarged. There is severely elevated pulmonary artery systolic pressure.  3. Left atrial size was severely dilated.  4. Right atrial size was severely dilated.  5. There is no evidence of cardiac tamponade.  6. The mitral valve is degenerative. Mild to moderate mitral valve regurgitation. No evidence of mitral stenosis. Moderate mitral annular calcification.  7. Tricuspid valve regurgitation is severe.  8. The aortic valve is tricuspid. There is mild calcification of the aortic valve. There is mild thickening of the aortic valve. Aortic valve regurgitation is not visualized. No aortic stenosis is present.  9. The inferior vena cava is dilated in size with >50% respiratory variability, suggesting right atrial pressure of 8 mmHg. Comparison(s): Compared with the echo 06/03/21, the pericardial effusion is larger. There is no tamponade. FINDINGS  Left Ventricle: Left ventricular ejection fraction, by estimation, is 45 to 50%. The left ventricle has mildly decreased function. The left ventricle has no regional wall motion abnormalities. The left ventricular internal cavity size was normal in size. There is mild concentric left ventricular hypertrophy. Left ventricular  diastolic parameters are consistent with Grade II diastolic dysfunction (pseudonormalization). Elevated left ventricular end-diastolic pressure. Right Ventricle: The right ventricular size is mildly enlarged. No increase in right ventricular wall thickness. Right ventricular systolic function is normal. There is severely elevated pulmonary artery systolic pressure. The tricuspid regurgitant velocity is 4.10 m/s, and with an assumed right atrial pressure of 8 mmHg, the estimated right ventricular systolic pressure is 33.0 mmHg. Left Atrium: Left atrial size was severely dilated. Right Atrium: Right atrial size was severely dilated. Pericardium: Trivial pericardial effusion is present. There is no evidence of cardiac tamponade. Mitral Valve: The mitral valve is degenerative in appearance. There is moderate thickening of the mitral valve leaflet(s). There is moderate calcification of the mitral valve leaflet(s). Moderate mitral annular calcification. Mild to moderate mitral valve regurgitation. No evidence of mitral valve stenosis. Tricuspid Valve: The tricuspid valve is normal in structure. Tricuspid valve regurgitation is severe. No evidence of tricuspid stenosis. Aortic Valve: The aortic valve is tricuspid. There is mild calcification of the aortic valve. There is mild thickening of the aortic valve. Aortic valve regurgitation is not visualized. No aortic stenosis is present. Aortic valve peak gradient measures 8.5 mmHg. Pulmonic Valve: The pulmonic valve was normal in structure. Pulmonic valve regurgitation is trivial. No evidence of pulmonic  stenosis. Aorta: The aortic root is normal in size and structure. Venous: The inferior vena cava is dilated in size with greater than 50% respiratory variability, suggesting right atrial pressure of 8 mmHg. IAS/Shunts: No atrial level shunt detected by color flow Doppler. Additional Comments: There is pleural effusion in the right lateral region.  LEFT VENTRICLE PLAX 2D LVIDd:          4.70 cm      Diastology LVIDs:         3.70 cm      LV e' medial:    5.11 cm/s LV PW:         1.30 cm      LV E/e' medial:  23.9 LV IVS:        1.30 cm      LV e' lateral:   9.25 cm/s LVOT diam:     1.70 cm      LV E/e' lateral: 13.2 LVOT Area:     2.27 cm  LV Volumes (MOD) LV vol d, MOD A2C: 105.0 ml LV vol d, MOD A4C: 113.0 ml LV vol s, MOD A2C: 64.0 ml LV vol s, MOD A4C: 65.9 ml LV SV MOD A2C:     41.0 ml LV SV MOD A4C:     113.0 ml LV SV MOD BP:      41.7 ml RIGHT VENTRICLE RV Basal diam:  4.70 cm RV Mid diam:    3.70 cm RV S prime:     8.70 cm/s TAPSE (M-mode): 1.0 cm LEFT ATRIUM             Index        RIGHT ATRIUM           Index LA diam:        4.40 cm 2.42 cm/m   RA Area:     24.50 cm LA Vol (A2C):   88.8 ml 48.86 ml/m  RA Volume:   85.70 ml  47.15 ml/m LA Vol (A4C):   60.4 ml 33.23 ml/m LA Biplane Vol: 73.0 ml 40.16 ml/m  AORTIC VALVE                 PULMONIC VALVE AV Area (Vmax): 1.91 cm     PV Vmax:          0.96 m/s AV Vmax:        146.00 cm/s  PV Peak grad:     3.7 mmHg AV Peak Grad:   8.5 mmHg     PR End Diast Vel: 13.25 msec LVOT Vmax:      123.00 cm/s  RVOT Peak grad:   1 mmHg  AORTA Ao Root diam: 2.90 cm MITRAL VALVE                TRICUSPID VALVE MV Area (PHT): 5.54 cm     TR Peak grad:   67.2 mmHg MV Decel Time: 137 msec     TR Vmax:        410.00 cm/s MV E velocity: 122.00 cm/s MV A velocity: 93.80 cm/s   SHUNTS MV E/A ratio:  1.30         Systemic Diam: 1.70 cm MV A Prime:    6.7 cm/s Skeet Latch MD Electronically signed by Skeet Latch MD Signature Date/Time: 07/20/2021/3:09:33 PM    Final     Microbiology: Recent Results (from the past 240 hour(s))  Resp Panel by RT-PCR (Flu A&B, Covid) Nasopharyngeal Swab     Status: None  Collection Time: 07/12/21  6:51 PM   Specimen: Nasopharyngeal Swab; Nasopharyngeal(NP) swabs in vial transport medium  Result Value Ref Range Status   SARS Coronavirus 2 by RT PCR NEGATIVE NEGATIVE Final    Comment: (NOTE) SARS-CoV-2  target nucleic acids are NOT DETECTED.  The SARS-CoV-2 RNA is generally detectable in upper respiratory specimens during the acute phase of infection. The lowest concentration of SARS-CoV-2 viral copies this assay can detect is 138 copies/mL. A negative result does not preclude SARS-Cov-2 infection and should not be used as the sole basis for treatment or other patient management decisions. A negative result may occur with  improper specimen collection/handling, submission of specimen other than nasopharyngeal swab, presence of viral mutation(s) within the areas targeted by this assay, and inadequate number of viral copies(<138 copies/mL). A negative result must be combined with clinical observations, patient history, and epidemiological information. The expected result is Negative.  Fact Sheet for Patients:  EntrepreneurPulse.com.au  Fact Sheet for Healthcare Providers:  IncredibleEmployment.be  This test is no t yet approved or cleared by the [place] FDA and  has been authorized for detection and/or diagnosis of SARS-CoV-2 by FDA under an Emergency Use Authorization (EUA). This EUA will remain  in effect (meaning this test can be used) for the duration of the COVID-19 declaration under Section 564(b)(1) of the Act, 21 U.S.C.section 360bbb-3(b)(1), unless the authorization is terminated  or revoked sooner.       Influenza A by PCR NEGATIVE NEGATIVE Final   Influenza B by PCR NEGATIVE NEGATIVE Final    Comment: (NOTE) The Xpert Xpress SARS-CoV-2/FLU/RSV plus assay is intended as an aid in the diagnosis of influenza from Nasopharyngeal swab specimens and should not be used as a sole basis for treatment. Nasal washings and aspirates are unacceptable for Xpert Xpress SARS-CoV-2/FLU/RSV testing.  Fact Sheet for Patients: EntrepreneurPulse.com.au  Fact Sheet for Healthcare  Providers: IncredibleEmployment.be  This test is not yet approved or cleared by the [place] FDA and has been authorized for detection and/or diagnosis of SARS-CoV-2 by FDA under an Emergency Use Authorization (EUA). This EUA will remain in effect (meaning this test can be used) for the duration of the COVID-19 declaration under Section 564(b)(1) of the Act, 21 U.S.C. section 360bbb-3(b)(1), unless the authorization is terminated or revoked.  Performed at [facility], [address] [place], [place] [address]   Surgical pcr screen     Status: None   Collection Time: 07/15/21  8:06 AM   Specimen: Nasal Mucosa; Nasal Swab  Result Value Ref Range Status   MRSA, PCR NEGATIVE NEGATIVE Final   Staphylococcus aureus NEGATIVE NEGATIVE Final    Comment: (NOTE) The Xpert SA Assay (FDA approved for NASAL specimens in patients 18 years of age and older), is one component of a comprehensive surveillance program. It is not intended to diagnose infection nor to guide or monitor treatment. Performed at [facility], [address] [place], [place] [address]   CULTURE, BLOOD (ROUTINE X 2) w Reflex to ID Panel     Status: Abnormal   Collection Time: 07/15/21 12:21 PM   Specimen: BLOOD  Result Value Ref Range Status   Specimen Description   Final    BLOOD RIGHT ANTECUBITAL Performed at [facility], [address], [place], [place] [address]    Special Requests   Final    BOTTLES DRAWN AEROBIC ONLY Blood Culture adequate volume Performed at [facility], [address] [place], [place] [address]  Culture  Setup Time   Final    AEROBIC BOTTLE ONLY GRAM NEGATIVE RODS CRITICAL VALUE NOTED.  VALUE IS CONSISTENT WITH PREVIOUSLY REPORTED AND CALLED VALUE. Performed at [facility], [address] [place], [place] [address]    Culture (A)  Final    SERRATIA MARCESCENS SUSCEPTIBILITIES PERFORMED ON PREVIOUS CULTURE  WITHIN THE LAST 5 DAYS. Performed at [facility], [place] [address] [place], [place] [address]    Report Status 07/18/2021 FINAL  Final  CULTURE, BLOOD (ROUTINE X 2) w Reflex to ID Panel     Status: Abnormal   Collection Time: 07/15/21 12:50 PM   Specimen: BLOOD  Result Value Ref Range Status   Specimen Description   Final    BLOOD BLOOD RIGHT HAND Performed at [facility], [address] [place], [place] [address]    Special Requests   Final    BOTTLES DRAWN AEROBIC ONLY Blood Culture results may not be optimal due to an inadequate volume of blood received in culture bottles Performed at [facility], [address], [place], [place] [address]    Culture  Setup Time   Final    GRAM NEGATIVE RODS AEROBIC BOTTLE ONLY Organism ID to follow CRITICAL RESULT CALLED TO, READ BACK BY AND VERIFIED WITH: Winfield Rast PATEL 07/16/21 [place] Performed at [facility], [address] [place], [place] [address]    Culture SERRATIA MARCESCENS (A)  Final   Report Status 07/18/2021 FINAL  Final   Organism ID, Bacteria SERRATIA MARCESCENS  Final      Susceptibility   Serratia marcescens - MIC*    CEFAZOLIN >=64 RESISTANT Resistant     CEFEPIME <=0.12 SENSITIVE Sensitive     CEFTAZIDIME <=1 SENSITIVE Sensitive     CEFTRIAXONE <=0.25 SENSITIVE Sensitive     CIPROFLOXACIN <=0.25 SENSITIVE Sensitive     GENTAMICIN <=1 SENSITIVE Sensitive     TRIMETH/SULFA <=20 SENSITIVE Sensitive     * SERRATIA MARCESCENS  Blood Culture ID Panel (Reflexed)     Status: Abnormal   Collection Time: 07/15/21 12:50 PM  Result Value Ref Range Status   Enterococcus faecalis NOT DETECTED NOT DETECTED Final   Enterococcus Faecium NOT DETECTED NOT DETECTED Final   Listeria monocytogenes NOT DETECTED NOT DETECTED Final   Staphylococcus species NOT DETECTED NOT DETECTED Final   Staphylococcus aureus (BCID) NOT DETECTED NOT DETECTED Final   Staphylococcus epidermidis NOT DETECTED NOT  DETECTED Final   Staphylococcus lugdunensis NOT DETECTED NOT DETECTED Final   Streptococcus species NOT DETECTED NOT DETECTED Final   Streptococcus agalactiae NOT DETECTED NOT DETECTED Final   Streptococcus pneumoniae NOT DETECTED NOT DETECTED Final   Streptococcus pyogenes NOT DETECTED NOT DETECTED Final   A.calcoaceticus-baumannii NOT DETECTED NOT DETECTED Final   Bacteroides fragilis NOT DETECTED NOT DETECTED Final   Enterobacterales DETECTED (A) NOT DETECTED Final    Comment: Enterobacterales represent a large order of gram negative bacteria, not a single organism. CRITICAL RESULT CALLED TO, READ BACK BY AND VERIFIED WITH: Winfield Rast PATEL 07/16/21 0737 KLW    Enterobacter cloacae complex NOT DETECTED NOT DETECTED Final   Escherichia coli NOT DETECTED NOT DETECTED Final   Klebsiella aerogenes NOT DETECTED NOT DETECTED Final   Klebsiella oxytoca NOT DETECTED NOT DETECTED Final   Klebsiella pneumoniae NOT DETECTED NOT DETECTED Final   Proteus species NOT DETECTED NOT DETECTED Final   Salmonella species NOT DETECTED NOT DETECTED Final   Serratia marcescens DETECTED (A) NOT DETECTED Final  Comment: CRITICAL RESULT CALLED TO, READ BACK BY AND VERIFIED WITH: Winfield Rast PATEL 07/16/21 0737 KLW    Haemophilus influenzae NOT DETECTED NOT DETECTED Final   Neisseria meningitidis NOT DETECTED NOT DETECTED Final   Pseudomonas aeruginosa NOT DETECTED NOT DETECTED Final   Stenotrophomonas maltophilia NOT DETECTED NOT DETECTED Final   Candida albicans NOT DETECTED NOT DETECTED Final   Candida auris NOT DETECTED NOT DETECTED Final   Candida glabrata NOT DETECTED NOT DETECTED Final   Candida krusei NOT DETECTED NOT DETECTED Final   Candida parapsilosis NOT DETECTED NOT DETECTED Final   Candida tropicalis NOT DETECTED NOT DETECTED Final   Cryptococcus neoformans/gattii NOT DETECTED NOT DETECTED Final   CTX-M ESBL NOT DETECTED NOT DETECTED Final   Carbapenem resistance IMP NOT DETECTED NOT DETECTED  Final   Carbapenem resistance KPC NOT DETECTED NOT DETECTED Final   Carbapenem resistance NDM NOT DETECTED NOT DETECTED Final   Carbapenem resist OXA 48 LIKE NOT DETECTED NOT DETECTED Final   Carbapenem resistance VIM NOT DETECTED NOT DETECTED Final    Comment: Performed at [facility], [address] [place], [place] [address]  Blood Culture (routine x 2)     Status: None   Collection Time: 07/17/21  1:10 AM   Specimen: BLOOD  Result Value Ref Range Status   Specimen Description BLOOD RIGHT HAND  Final   Special Requests   Final    BOTTLES DRAWN AEROBIC AND ANAEROBIC Blood Culture results may not be optimal due to an excessive volume of blood received in culture bottles   Culture   Final    NO GROWTH 5 DAYS Performed at [facility], [address] [place], [place] [address]    Report Status 07/22/2021 FINAL  Final  Blood Culture (routine x 2)     Status: None   Collection Time: 07/17/21  1:10 AM   Specimen: BLOOD  Result Value Ref Range Status   Specimen Description BLOOD RIGHT ASSIST CONTROL  Final   Special Requests   Final    BOTTLES DRAWN AEROBIC AND ANAEROBIC Blood Culture adequate volume   Culture   Final    NO GROWTH 5 DAYS Performed at [facility], [address] [place], [place] [address]    Report Status 07/22/2021 FINAL  Final  Resp Panel by RT-PCR (Flu A&B, Covid) Nasopharyngeal Swab     Status: None   Collection Time: 07/17/21  1:10 AM   Specimen: Nasopharyngeal Swab; Nasopharyngeal(NP) swabs in vial transport medium  Result Value Ref Range Status   SARS Coronavirus 2 by RT PCR NEGATIVE NEGATIVE Final    Comment: (NOTE) SARS-CoV-2 target nucleic acids are NOT DETECTED.  The SARS-CoV-2 RNA is generally detectable in upper respiratory specimens during the acute phase of infection. The lowest concentration of SARS-CoV-2 viral copies this assay can detect is 138 copies/mL. A negative result does not preclude SARS-Cov-2 infection  and should not be used as the sole basis for treatment or other patient management decisions. A negative result may occur with  improper specimen collection/handling, submission of specimen other than nasopharyngeal swab, presence of viral mutation(s) within the areas targeted by this assay, and inadequate number of viral copies(<138 copies/mL). A negative result must be combined with clinical observations, patient history, and epidemiological information. The expected result is Negative.  Fact Sheet for Patients:  EntrepreneurPulse.com.au  Fact Sheet for Healthcare Providers:  IncredibleEmployment.be  This test is no t yet approved or cleared by the [place] FDA and  has  been authorized for detection and/or diagnosis of SARS-CoV-2 by FDA under an Emergency Use Authorization (EUA). This EUA will remain  in effect (meaning this test can be used) for the duration of the COVID-19 declaration under Section 564(b)(1) of the Act, 21 U.S.C.section 360bbb-3(b)(1), unless the authorization is terminated  or revoked sooner.       Influenza A by PCR NEGATIVE NEGATIVE Final   Influenza B by PCR NEGATIVE NEGATIVE Final    Comment: (NOTE) The Xpert Xpress SARS-CoV-2/FLU/RSV plus assay is intended as an aid in the diagnosis of influenza from Nasopharyngeal swab specimens and should not be used as a sole basis for treatment. Nasal washings and aspirates are unacceptable for Xpert Xpress SARS-CoV-2/FLU/RSV testing.  Fact Sheet for Patients: EntrepreneurPulse.com.au  Fact Sheet for Healthcare Providers: IncredibleEmployment.be  This test is not yet approved or cleared by the [place] FDA and has been authorized for detection and/or diagnosis of SARS-CoV-2 by FDA under an Emergency Use Authorization (EUA). This EUA will remain in effect (meaning this test can be used) for the duration of the COVID-19 declaration  under Section 564(b)(1) of the Act, 21 U.S.C. section 360bbb-3(b)(1), unless the authorization is terminated or revoked.  Performed at [facility], [address] [place], [place] [address]   Aerobic/Anaerobic Culture w Gram Stain (surgical/deep wound)     Status: None   Collection Time: 07/17/21  1:10 AM   Specimen: Left Antecubital; Wound  Result Value Ref Range Status   Specimen Description   Final    LEFT ANTECUBITAL Performed at [facility], [address], [place], [place] [address]    Special Requests   Final    NONE Performed at [facility], [address] [place], [place] [address]    Gram Stain   Final    NO SQUAMOUS EPITHELIAL CELLS SEEN MODERATE WBC SEEN RARE GRAM NEGATIVE RODS    Culture   Final    MODERATE SERRATIA MARCESCENS NO ANAEROBES ISOLATED Performed at [facility], [place] [address] [place], [place] [address]    Report Status 07/22/2021 FINAL  Final   Organism ID, Bacteria SERRATIA MARCESCENS  Final      Susceptibility   Serratia marcescens - MIC*    CEFAZOLIN >=64 RESISTANT Resistant     CEFEPIME <=0.12 SENSITIVE Sensitive     CEFTAZIDIME <=1 SENSITIVE Sensitive     CEFTRIAXONE <=0.25 SENSITIVE Sensitive     CIPROFLOXACIN <=0.25 SENSITIVE Sensitive     GENTAMICIN <=1 SENSITIVE Sensitive     TRIMETH/SULFA <=20 SENSITIVE Sensitive     * MODERATE SERRATIA MARCESCENS  Aerobic/Anaerobic Culture w Gram Stain (surgical/deep wound)     Status: None (Preliminary result)   Collection Time: 07/17/21  5:30 PM   Specimen: PATH Other; Tissue  Result Value Ref Range Status   Specimen Description   Final    WOUND Performed at [facility], [address] [place], [place] [address]    Special Requests   Final    LEFT UPPER ARM Performed at [facility], [address] [place], [place] [address]    Gram Stain   Final    RARE WBC PRESENT, PREDOMINANTLY PMN NO ORGANISMS SEEN Performed at [facility], [place] [address], [place], [place] [address]    Culture   Final    MODERATE SERRATIA MARCESCENS NO ANAEROBES ISOLATED; CULTURE IN PROGRESS FOR 5 DAYS    Report Status PENDING  Incomplete   Organism ID, Bacteria SERRATIA  MARCESCENS  Final      Susceptibility   Serratia marcescens - MIC*    CEFAZOLIN >=64 RESISTANT Resistant     CEFEPIME <=0.12 SENSITIVE Sensitive     CEFTAZIDIME <=1 SENSITIVE Sensitive     CEFTRIAXONE <=0.25 SENSITIVE Sensitive     CIPROFLOXACIN <=0.25 SENSITIVE Sensitive     GENTAMICIN <=1 SENSITIVE Sensitive     TRIMETH/SULFA <=20 SENSITIVE Sensitive     * MODERATE SERRATIA MARCESCENS  Aerobic/Anaerobic Culture w Gram Stain (surgical/deep wound)     Status: None   Collection Time: 07/17/21  5:50 PM   Specimen: PATH Other; Tissue  Result Value Ref Range Status   Specimen Description   Final    WOUND Performed at [facility], [address] [place], [place] [address]    Special Requests   Final    LEFT UPPER ARM Performed at [facility], [address] [place], [place] [address]    Gram Stain   Final    FEW WBC PRESENT, PREDOMINANTLY PMN FEW GRAM NEGATIVE RODS    Culture   Final    MODERATE SERRATIA MARCESCENS NO ANAEROBES ISOLATED Performed at [facility], [place] [address] [place], [place] [address]    Report Status 07/22/2021 FINAL  Final   Organism ID, Bacteria SERRATIA MARCESCENS  Final      Susceptibility   Serratia marcescens - MIC*    CEFAZOLIN >=64 RESISTANT Resistant     CEFEPIME <=0.12 SENSITIVE Sensitive     CEFTAZIDIME <=1 SENSITIVE Sensitive     CEFTRIAXONE <=0.25 SENSITIVE Sensitive     CIPROFLOXACIN <=0.25 SENSITIVE Sensitive     GENTAMICIN <=1 SENSITIVE Sensitive     TRIMETH/SULFA <=20 SENSITIVE Sensitive     * MODERATE SERRATIA MARCESCENS  CULTURE, BLOOD (ROUTINE X 2) w Reflex to ID Panel     Status: None (Preliminary result)   Collection Time: 07/19/21  1:02 PM   Specimen: BLOOD  Result  Value Ref Range Status   Specimen Description BLOOD BLOOD RIGHT HAND  Final   Special Requests   Final    BOTTLES DRAWN AEROBIC AND ANAEROBIC Blood Culture adequate volume   Culture   Final    NO GROWTH 3 DAYS Performed at [facility], [address] [place], [place] [address]    Report Status PENDING  Incomplete  CULTURE, BLOOD (ROUTINE X 2) w Reflex to ID Panel     Status: None (Preliminary result)   Collection Time: 07/19/21  2:11 PM   Specimen: BLOOD  Result Value Ref Range Status   Specimen Description BLOOD BLOOD RIGHT HAND  Final   Special Requests   Final    BOTTLES DRAWN AEROBIC ONLY Blood Culture results may not be optimal due to an inadequate volume of blood received in culture bottles   Culture   Final    NO GROWTH 3 DAYS Performed at [facility], [address], [place], [place] [address]    Report Status PENDING  Incomplete  Cath Tip Culture     Status: Abnormal   Collection Time: 07/19/21  5:57 PM   Specimen: Catheter Tip; Other  Result Value Ref Range Status   Specimen Description   Final    CATH TIP Performed at [facility], [address] [place], [place] [address]    Special Requests   Final    NONE Performed at [facility], [place],  [address]    Culture 30,000 COLONIES/mL STAPHYLOCOCCUS  EPIDERMIDIS (A)  Final   Report Status 07/22/2021 FINAL  Final   Organism ID, Bacteria STAPHYLOCOCCUS EPIDERMIDIS (A)  Final      Susceptibility   Staphylococcus epidermidis - MIC*    CIPROFLOXACIN >=8 RESISTANT Resistant     ERYTHROMYCIN >=8 RESISTANT Resistant     GENTAMICIN 4 SENSITIVE Sensitive     OXACILLIN >=4 RESISTANT Resistant     TETRACYCLINE 2 SENSITIVE Sensitive     VANCOMYCIN 1 SENSITIVE Sensitive     TRIMETH/SULFA 80 RESISTANT Resistant     CLINDAMYCIN >=8 RESISTANT Resistant     RIFAMPIN <=0.5 SENSITIVE Sensitive     Inducible Clindamycin NEGATIVE Sensitive     * 30,000 COLONIES/mL  STAPHYLOCOCCUS EPIDERMIDIS  CULTURE, BLOOD (ROUTINE X 2) w Reflex to ID Panel     Status: None (Preliminary result)   Collection Time: 07/19/21  7:03 PM   Specimen: BLOOD  Result Value Ref Range Status   Specimen Description BLOOD RFOA  Final   Special Requests   Final    BOTTLES DRAWN AEROBIC AND ANAEROBIC Blood Culture adequate volume   Culture   Final    NO GROWTH 3 DAYS Performed at [facility], [address] [place], [place] [address]    Report Status PENDING  Incomplete  CULTURE, BLOOD (ROUTINE X 2) w Reflex to ID Panel     Status: None (Preliminary result)   Collection Time: 07/19/21  7:09 PM   Specimen: BLOOD  Result Value Ref Range Status   Specimen Description BLOOD BLRA  Final   Special Requests   Final    BOTTLES DRAWN AEROBIC AND ANAEROBIC Blood Culture results may not be optimal due to an excessive volume of blood received in culture bottles   Culture   Final    NO GROWTH 3 DAYS Performed at [facility], [address] [place], [place] [address]    Report Status PENDING  Incomplete     Labs: CBC: Recent Labs  Lab 07/17/21 0110 07/17/21 1232 07/18/21 0423 07/19/21 0430 07/20/21 0537 07/21/21 0430 07/22/21 0432  WBC 9.1   < > 6.5 8.5 9.2 8.7 10.2  NEUTROABS 7.5  --   --   --   --   --   --   HGB 6.5*   < > 8.2* 8.8* 7.8* 6.9* 8.2*  HCT 20.2*   < > 25.8* 26.2* 23.5* 20.7* 24.6*  MCV 94.8   < > 94.2 91.9 92.5 91.6 91.4  PLT 130*   < > 120* 145* 148* 133* 141*   < > = values in this interval not displayed.   Basic Metabolic Panel: Recent Labs  Lab 07/18/21 0423 07/19/21 0430 07/19/21 1644 07/20/21 0537 07/21/21 0430 07/22/21 0432  NA 138 134*  --  134* 136 137  K 4.7 4.0  --  3.0* 3.7 3.8  CL 98 96*  --  94* 97* 97*  CO2 22 27  --  27 28 29   GLUCOSE 100* 101*  --  105* 120* 89  BUN 67* 43*  --  32* 24* 34*  CREATININE 8.28* 5.81*  --  4.81* 3.78* 5.05*  CALCIUM 8.4* 8.1*  --  7.7* 8.2* 8.6*  MG 2.0 1.7 2.0 1.6* 1.8 2.0   PHOS 8.5* 6.7*  --  4.3 2.9 3.9   Liver Function Tests: Recent Labs  Lab 07/17/21 0110 07/17/21 2353  AST 16 13*  ALT 12 10  ALKPHOS 117 102  BILITOT 0.9 1.3*  PROT 6.6 6.2*  ALBUMIN 2.6* 2.4*   No results for input(s): LIPASE, AMYLASE in the last 168 hours. Recent Labs  Lab 07/17/21 2353  AMMONIA <10   Cardiac Enzymes: No results for input(s): CKTOTAL, CKMB, CKMBINDEX, TROPONINI in the last 168 hours. BNP (last 3 results) Recent Labs    05/28/21 1747 06/02/21 1125 06/26/21 0804  BNP >4,500.0* >4,500.0* 4,471.7*   CBG: Recent Labs  Lab 07/17/21 2014  GLUCAP 80    Time spent: 35 minutes  Signed:  Val Riles  [facility]  07/22/2021 4:49 PM

## 2021-07-22 NOTE — Progress Notes (Signed)
Dressing to left arm changed as per md's orders, moderate amt of bloody drainage noted, patient instructed not to remove dressing.

## 2021-07-23 ENCOUNTER — Emergency Department: Payer: Medicare Other

## 2021-07-23 ENCOUNTER — Telehealth (INDEPENDENT_AMBULATORY_CARE_PROVIDER_SITE_OTHER): Payer: Self-pay

## 2021-07-23 ENCOUNTER — Emergency Department
Admission: EM | Admit: 2021-07-23 | Discharge: 2021-07-23 | Disposition: A | Discharge status: Home / Self Care | Payer: Medicare Other | Attending: Emergency Medicine | Admitting: Emergency Medicine

## 2021-07-23 ENCOUNTER — Other Ambulatory Visit: Payer: Self-pay

## 2021-07-23 DIAGNOSIS — Z8616 Personal history of COVID-19: Secondary | ICD-10-CM | POA: Diagnosis not present

## 2021-07-23 DIAGNOSIS — I5043 Acute on chronic combined systolic (congestive) and diastolic (congestive) heart failure: Secondary | ICD-10-CM | POA: Diagnosis not present

## 2021-07-23 DIAGNOSIS — R140 Abdominal distension (gaseous): Secondary | ICD-10-CM | POA: Insufficient documentation

## 2021-07-23 DIAGNOSIS — Z79899 Other long term (current) drug therapy: Secondary | ICD-10-CM | POA: Insufficient documentation

## 2021-07-23 DIAGNOSIS — T82838A Hemorrhage of vascular prosthetic devices, implants and grafts, initial encounter: Secondary | ICD-10-CM | POA: Insufficient documentation

## 2021-07-23 DIAGNOSIS — N186 End stage renal disease: Secondary | ICD-10-CM | POA: Diagnosis not present

## 2021-07-23 DIAGNOSIS — Y848 Other medical procedures as the cause of abnormal reaction of the patient, or of later complication, without mention of misadventure at the time of the procedure: Secondary | ICD-10-CM | POA: Diagnosis not present

## 2021-07-23 DIAGNOSIS — Z20822 Contact with and (suspected) exposure to covid-19: Secondary | ICD-10-CM | POA: Insufficient documentation

## 2021-07-23 DIAGNOSIS — F17210 Nicotine dependence, cigarettes, uncomplicated: Secondary | ICD-10-CM | POA: Diagnosis not present

## 2021-07-23 DIAGNOSIS — Z992 Dependence on renal dialysis: Secondary | ICD-10-CM | POA: Insufficient documentation

## 2021-07-23 DIAGNOSIS — J449 Chronic obstructive pulmonary disease, unspecified: Secondary | ICD-10-CM | POA: Insufficient documentation

## 2021-07-23 DIAGNOSIS — I132 Hypertensive heart and chronic kidney disease with heart failure and with stage 5 chronic kidney disease, or end stage renal disease: Secondary | ICD-10-CM | POA: Diagnosis not present

## 2021-07-23 LAB — CBC WITH DIFFERENTIAL/PLATELET
Abs Immature Granulocytes: 0.34 10*3/uL — ABNORMAL HIGH (ref 0.00–0.07)
Basophils Absolute: 0.1 10*3/uL (ref 0.0–0.1)
Basophils Relative: 1 %
Eosinophils Absolute: 0.2 10*3/uL (ref 0.0–0.5)
Eosinophils Relative: 2 %
HCT: 23.6 % — ABNORMAL LOW (ref 39.0–52.0)
Hemoglobin: 7.8 g/dL — ABNORMAL LOW (ref 13.0–17.0)
Immature Granulocytes: 3 %
Lymphocytes Relative: 8 %
Lymphs Abs: 0.9 10*3/uL (ref 0.7–4.0)
MCH: 30.7 pg (ref 26.0–34.0)
MCHC: 33.1 g/dL (ref 30.0–36.0)
MCV: 92.9 fL (ref 80.0–100.0)
Monocytes Absolute: 0.9 10*3/uL (ref 0.1–1.0)
Monocytes Relative: 9 %
Neutro Abs: 8.5 10*3/uL — ABNORMAL HIGH (ref 1.7–7.7)
Neutrophils Relative %: 77 %
Platelets: 156 10*3/uL (ref 150–400)
RBC: 2.54 MIL/uL — ABNORMAL LOW (ref 4.22–5.81)
RDW: 17.3 % — ABNORMAL HIGH (ref 11.5–15.5)
WBC: 10.8 10*3/uL — ABNORMAL HIGH (ref 4.0–10.5)
nRBC: 0 % (ref 0.0–0.2)

## 2021-07-23 LAB — COMPREHENSIVE METABOLIC PANEL
ALT: 13 U/L (ref 0–44)
AST: 31 U/L (ref 15–41)
Albumin: 3.2 g/dL — ABNORMAL LOW (ref 3.5–5.0)
Alkaline Phosphatase: 125 U/L (ref 38–126)
Anion gap: 14 (ref 5–15)
BUN: 22 mg/dL — ABNORMAL HIGH (ref 6–20)
CO2: 24 mmol/L (ref 22–32)
Calcium: 9.2 mg/dL (ref 8.9–10.3)
Chloride: 100 mmol/L (ref 98–111)
Creatinine, Ser: 3.73 mg/dL — ABNORMAL HIGH (ref 0.61–1.24)
GFR, Estimated: 19 mL/min — ABNORMAL LOW (ref >60)
Glucose, Bld: 90 mg/dL (ref 70–99)
Potassium: 4.1 mmol/L (ref 3.5–5.1)
Sodium: 138 mmol/L (ref 135–145)
Total Bilirubin: 1.5 mg/dL — ABNORMAL HIGH (ref 0.3–1.2)
Total Protein: 7.5 g/dL (ref 6.5–8.1)

## 2021-07-23 LAB — AEROBIC/ANAEROBIC CULTURE W GRAM STAIN (SURGICAL/DEEP WOUND)

## 2021-07-23 LAB — RESP PANEL BY RT-PCR (FLU A&B, COVID) ARPGX2
Influenza A by PCR: NEGATIVE
Influenza B by PCR: NEGATIVE
SARS Coronavirus 2 by RT PCR: NEGATIVE

## 2021-07-23 LAB — PHOSPHORUS: Phosphorus: 3 mg/dL (ref 2.5–4.6)

## 2021-07-23 LAB — BRAIN NATRIURETIC PEPTIDE: B Natriuretic Peptide: >4500 pg/mL — ABNORMAL HIGH (ref 0.0–100.0)

## 2021-07-23 LAB — MAGNESIUM: Magnesium: 2.1 mg/dL (ref 1.7–2.4)

## 2021-07-23 IMAGING — DX DG CHEST 1V PORT
2 series · 2 of 2 positions shown · non-contrast
Comparison: Portable chest [DATE] and earlier.

CLINICAL DATA: 54-year-old male status post dialysis fistula
resection yesterday, wound seeping blood.

EXAM:
PORTABLE CHEST 1 VIEW

[chest ap (1 of 2)]
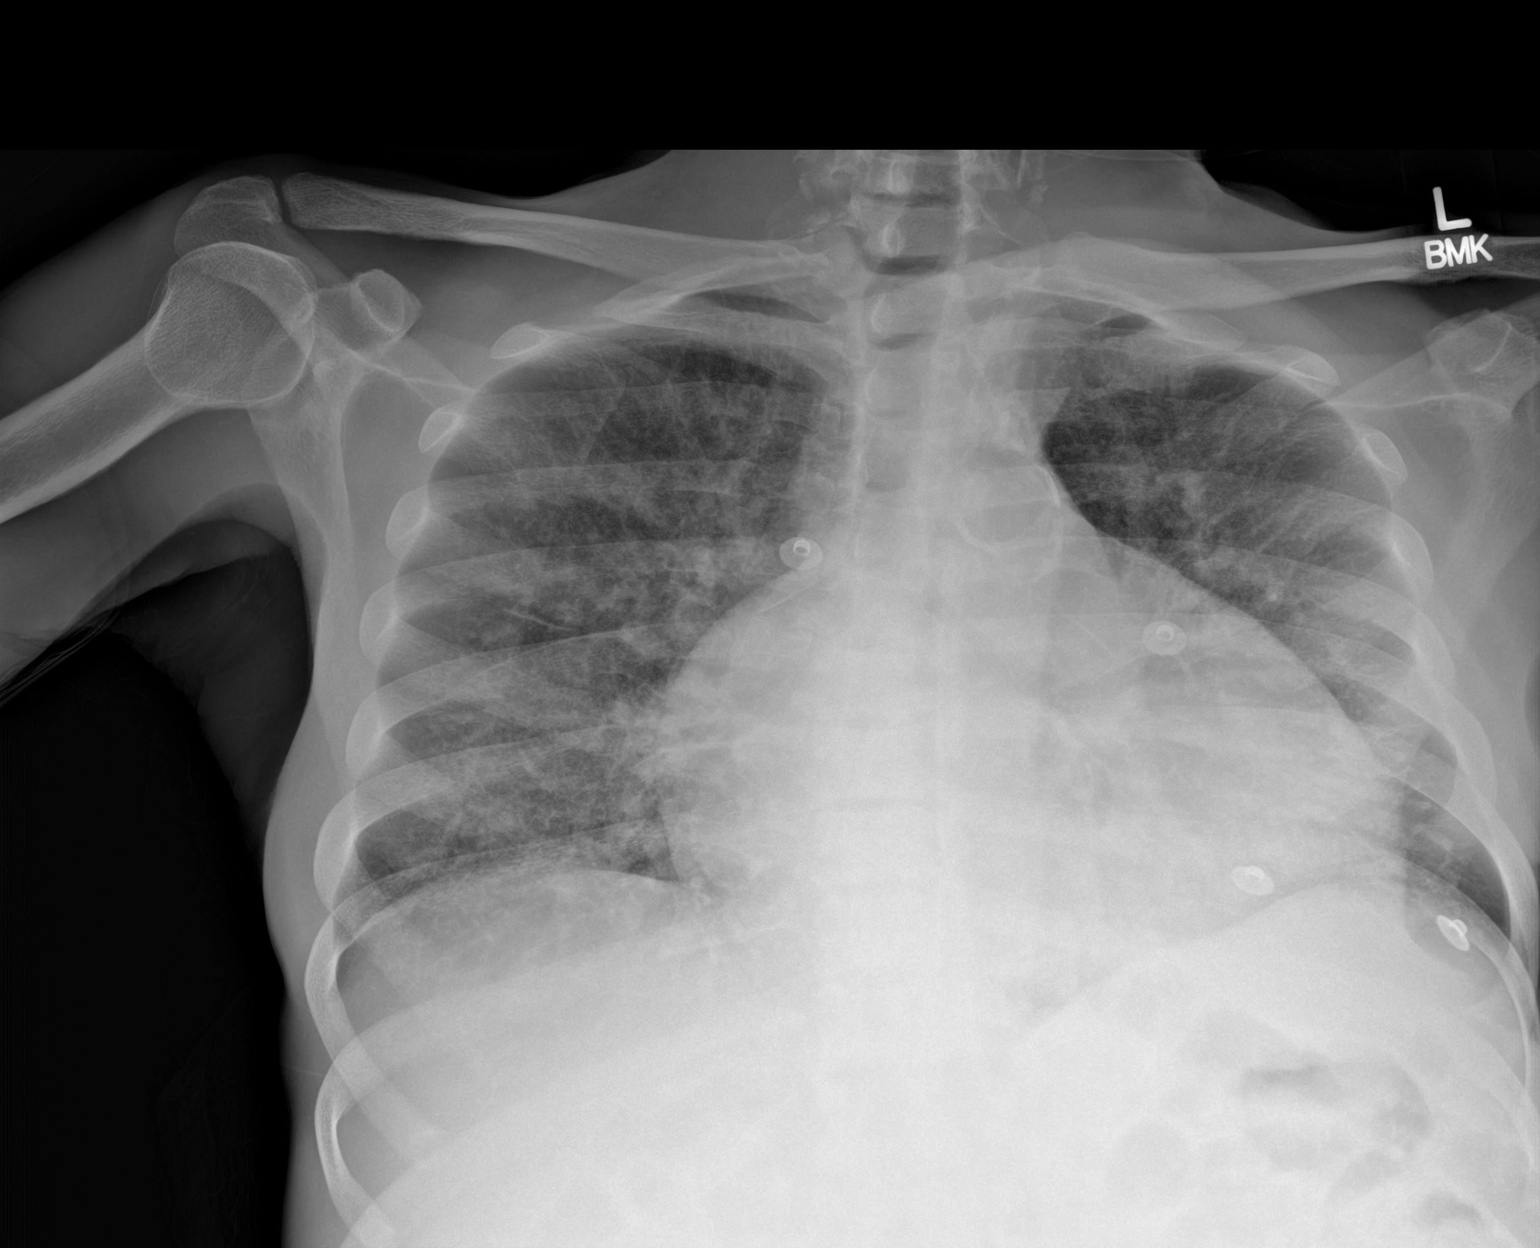

[chest ap (2 of 2)]
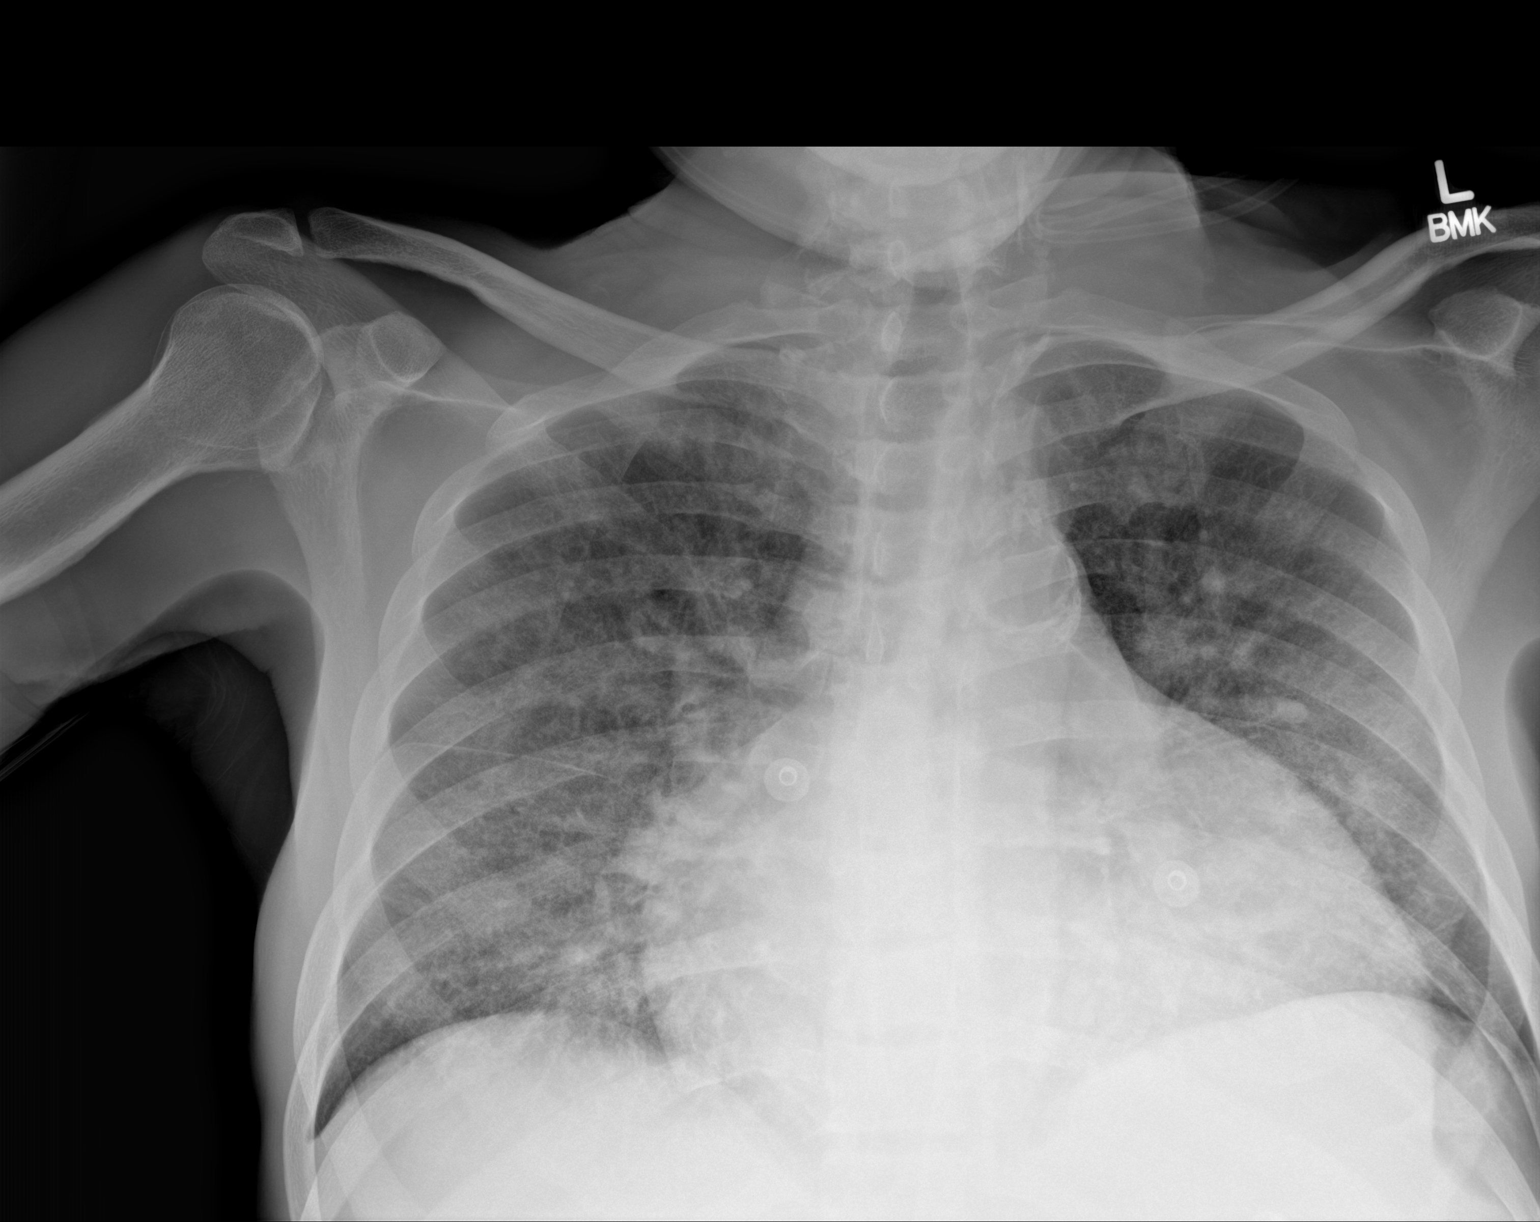

[2 of 2 positions shown; findings below may reference images not displayed]

FINDINGS: Portable AP upright view at [JY] hours. Left IJ dialysis catheter
removed. Stable cardiomegaly. Calcified aortic atherosclerosis.
Visualized tracheal air column is within normal limits. Somewhat low
lung volumes with decreased pulmonary vascularity since last month.
No overt edema. No pneumothorax, pleural effusion or consolidation.
No acute osseous abnormality identified. Negative visible bowel gas.
IMPRESSION: 1. Left IJ dialysis catheter removed.
2. Cardiomegaly with interval regression of pulmonary vascular
congestion. No overt edema.
3.  Aortic Atherosclerosis ([JY]-[JY]).

## 2021-07-23 NOTE — Discharge Instructions (Signed)
As we discussed you will need to have a permanent dialysis catheter placed.  Vascular surgery is aware and should be calling you later today to inform you of the place/time to have your catheter placed.  Please return to the emergency department for any trouble breathing, any recurrence of bleeding, or any other symptom personally concerning to yourself.

## 2021-07-23 NOTE — ED Provider Notes (Signed)
[facility]  ____________________________________________   Event Date/Time   First MD Initiated Contact with Patient 07/23/21 0503     (approximate)  I have reviewed the triage vital signs and the nursing notes.   HISTORY  Chief Complaint Wound Check (Fistula removed yesterday. Suture line bleeding.)    HPI ULICE FOLLETT is a 55 y.o. male ESRD on HD MWF, alcohol abuse, COPD, medication and dialysis noncompliance with frequent hospitalizations for fluid overload who presents with bleeding from a left AV fistula graft.  Patient notes that he was discharged from hospital last night.  He thinks that there was a plan for him to return to have dialysis today.  He noticed some bleeding from the left arm incision.  He does endorse shortness of breath denies cough or chest pain Patient was recently hospitalized and found to have a left upper extremity fistula thrombosis.  He was also found to have positive blood cultures for Serratia and Enterobacter and was treated with IV cefepime.  Ultimately left AMA but then returned to the ED for bleeding of the AV graft.  He was then readmitted and taken to the OR by vascular surgery on 10/26 for removal of the infected left brachial basilic AV fistula.  Per records looks like he was getting dialysis through a left temporary IJ cath placed on 10/30.  Ultimately left AMA yesterday.  He is last dialysis session was yesterday.  Patient currently has no dialysis access.       Past Medical History:  Diagnosis Date   Anemia    Aortic atherosclerosis ([place]) 11/12/2019   ED (erectile dysfunction)    Emphysema of lung ([place]) 11/12/2019   ESRD on hemodialysis ([place])    M/W/F in [place], [place]   ETOH abuse    History of blood transfusion    Hypertension    Wears dentures     Patient Active Problem List   Diagnosis Date Noted   ABLA (acute blood loss anemia) 07/17/2021   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  ([place]) 07/17/2021   AV fistula infection, 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/17/2021   Bacteremia 07/17/2021   AV fistula thrombosis, 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/17/2021   Pressure injury of skin 07/15/2021   Acute on chronic respiratory failure with hypoxemia () 07/12/2021   Ascites    Protein-calorie malnutrition, severe 06/05/2021   Hypervolemia associated with renal insufficiency 06/02/2021   Symptomatic anemia 05/28/2021   Acute on chronic diastolic CHF (congestive heart failure) ([place]) 05/28/2021   Hyperkalemia 05/28/2021   Pulmonary edema 04/29/2021   Pruritus 04/29/2021   COVID-19 virus infection 04/29/2021   Thrombocytopenia ([place]) 04/08/2021   COPD (chronic obstructive pulmonary disease) ([place]) 04/08/2021   CHF (congestive heart failure) ([place]) 03/25/2021   PAF (paroxysmal atrial fibrillation) ([place]) 03/20/2021   Chronic diastolic CHF (congestive heart failure) ([place]) 03/20/2021   Alcohol abuse 03/20/2021   Vision loss of left eye 03/20/2021   SOB (shortness of breath) 03/20/2021   Shortness of breath 03/10/2021   Abscess of buttock 03/02/2021   Fluid overload 03/02/2021   Multifocal pneumonia 01/13/2021   Current mild episode of major depressive disorder without prior episode ([place]) 04/09/2020   Atrial fibrillation with RVR ([place]) 04/04/2020   Non-compliance with renal dialysis ([place]) 03/21/2020   Acute CHF (congestive heart failure) ([place]) 03/12/2020   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 ([place]) 02/03/2020   Acute on chronic combined systolic and diastolic CHF (congestive heart failure) ([place]) 02/03/2020   Acute on chronic respiratory  failure with hypoxia ([place]) 11/12/2019   Aortic atherosclerosis ([place]) 11/12/2019   Emphysema of lung ([place]) 11/12/2019   Volume overload 10/05/2019   Elevated troponin 05/17/2018   Anemia in ESRD (end-stage renal disease) ([place]) 05/17/2018   ESRD on dialysis ([place]) 05/26/2017   Essential hypertension 05/26/2017   Moderate protein-calorie  malnutrition ([place]) 08/13/2016   Secondary hyperparathyroidism of renal origin ([place]) 08/13/2016   Tobacco abuse 08/04/2016    Past Surgical History:  Procedure Laterality Date   A/V FISTULAGRAM Left 07/15/2021   Procedure: A/V FISTULAGRAM;  Surgeon: Algernon Huxley, MD;  Location: [facility];  Service: Cardiovascular;  Laterality: Left;   BASCILIC VEIN TRANSPOSITION Left 08/07/2016   Procedure: LEFT BASILIC VEIN TRANSPOSITION;  Surgeon: Angelia Mould, MD;  Location: [place];  Service: Vascular;  Laterality: Left;   CLOSED REDUCTION NASAL FRACTURE N/A 08/11/2019   Procedure: CLOSED REDUCTION NASAL FRACTURE WITH STABILIZATION;  Surgeon: Irene Limbo, MD;  Location: [place];  Service: Plastics;  Laterality: N/A;   COLONOSCOPY N/A 08/12/2016   Procedure: COLONOSCOPY;  Surgeon: Otis Brace, MD;  Location: [employer];  Service: Gastroenterology;  Laterality: N/A;   COLONOSCOPY WITH PROPOFOL N/A 06/05/2021   Procedure: COLONOSCOPY WITH PROPOFOL;  Surgeon: Sharyn Creamer, MD;  Location: [place];  Service: Gastroenterology;  Laterality: N/A;   ESOPHAGOGASTRODUODENOSCOPY N/A 08/12/2016   Procedure: ESOPHAGOGASTRODUODENOSCOPY (EGD);  Surgeon: Otis Brace, MD;  Location: [place];  Service: Gastroenterology;  Laterality: N/A;   ESOPHAGOGASTRODUODENOSCOPY (EGD) WITH PROPOFOL N/A 06/05/2021   Procedure: ESOPHAGOGASTRODUODENOSCOPY (EGD) WITH PROPOFOL;  Surgeon: Sharyn Creamer, MD;  Location: [place];  Service: Gastroenterology;  Laterality: N/A;   INSERTION OF DIALYSIS CATHETER N/A 08/07/2016   Procedure: INSERTION OF TUNNELED DIALYSIS CATHETER;  Surgeon: Angelia Mould, MD;  Location: [place];  Service: Vascular;  Laterality: N/A;   LIGATION OF ARTERIOVENOUS  FISTULA Left 09/12/2016   Procedure: BANDING OF LEFT  ARTERIOVENOUS  FISTULA;  Surgeon: Angelia Mould, MD;  Location: [facility];  Service: Vascular;  Laterality: Left;   LIGATION OF ARTERIOVENOUS   FISTULA Left 07/17/2021   Procedure: LIGATION OF ARTERIOVENOUS  FISTULA;  Surgeon: Katha Cabal, MD;  Location: [facility];  Service: Vascular;  Laterality: Left;  also include placement of temporary dialysis catheter right or left groin   LOWER EXTREMITY INTERVENTION Right 12/02/2018   Procedure: LOWER EXTREMITY INTERVENTION;  Surgeon: Algernon Huxley, MD;  Location: [facility] [place] [facility];  Service: Cardiovascular;  Laterality: Right;   POLYPECTOMY  06/05/2021   Procedure: POLYPECTOMY;  Surgeon: Sharyn Creamer, MD;  Location: [facility];  Service: Gastroenterology;;   TEMPORARY DIALYSIS CATHETER Left 07/17/2021   Procedure: TEMPORARY DIALYSIS CATHETER;  Surgeon: Katha Cabal, MD;  Location: [facility];  Service: Vascular;  Laterality: Left;    Prior to Admission medications   Medication Sig Start Date End Date Taking? Authorizing Provider  albuterol (VENTOLIN HFA) 108 (90 Base) MCG/ACT inhaler Inhale 2 puffs into the lungs every 4 (four) hours as needed for wheezing or shortness of breath. 06/05/21   Danford, Suann Larry, MD  calcium acetate (PHOSLO) 667 MG capsule Take 2 capsules (1,334 mg total) by mouth 3 (three) times daily with meals. 06/05/21   Danford, Suann Larry, MD  cinacalcet (SENSIPAR) 30 MG tablet Take 1 tablet (30 mg total) by mouth daily with breakfast. 06/05/21   Danford, Suann Larry, MD  docusate sodium (COLACE) 100 MG capsule Take 1 capsule (100 mg total) by mouth 2 (two) times daily.  06/05/21   Edwin Dada, MD  epoetin alfa (EPOGEN) 4000 UNIT/ML injection Inject 1 mL (4,000 Units total) into the vein every Monday, Wednesday, and Friday with hemodialysis. 06/22/20   Enzo Bi, MD  ferrous sulfate 325 (65 FE) MG tablet Take 1 tablet (325 mg total) by mouth 2 (two) times daily with a meal. 06/05/21   Danford, Suann Larry, MD  metoprolol tartrate (LOPRESSOR) 50 MG tablet Take 1 tablet (50 mg total) by mouth 2 (two) times daily. 07/04/21   Jennye Boroughs, MD   pantoprazole (PROTONIX) 20 MG tablet Take 1 tablet (20 mg total) by mouth 2 (two) times daily before a meal. 06/05/21 08/04/21  Danford, Suann Larry, MD    Allergies Lisinopril  Family History  Problem Relation Age of Onset   Diabetes Mother    Kidney failure Mother    Healthy Father    Kidney failure Brother    Healthy Sister    Kidney disease Daughter    Post-traumatic stress disorder Neg Hx    Bladder Cancer Neg Hx    Kidney cancer Neg Hx     Social History Social History   Tobacco Use   Smoking status: Every Day    Packs/day: 0.50    Years: 40.00    Pack years: 20.00    Types: Cigarettes   Smokeless tobacco: Never  Vaping Use   Vaping Use: Never used  Substance Use Topics   Alcohol use: Not Currently    Alcohol/week: 21.0 standard drinks    Types: 21 Cans of beer per week    Comment: last drink 6 months ago   Drug use: No    Review of Systems   Review of Systems  Constitutional:  Negative for chills and fever.  Respiratory:  Positive for cough and shortness of breath.   Cardiovascular:  Negative for chest pain.  Gastrointestinal:  Negative for abdominal pain, nausea and vomiting.  Skin:  Positive for wound.  All other systems reviewed and are negative.  Physical Exam Updated Vital Signs BP (!) 152/107   Pulse (!) 108   Temp 98 F (36.7 C) (Oral)   Resp 18   SpO2 98%   Physical Exam Vitals and nursing note reviewed.  Constitutional:      General: He is not in acute distress.    Appearance: Normal appearance.  HENT:     Head: Normocephalic and atraumatic.  Eyes:     General: No scleral icterus.    Conjunctiva/sclera: Conjunctivae normal.  Pulmonary:     Effort: Pulmonary effort is normal. No respiratory distress.     Breath sounds: No wheezing.     Comments: Patient is tachypneic Abdominal:     General: There is distension.     Tenderness: There is no abdominal tenderness. There is no guarding.  Musculoskeletal:        General: No  deformity or signs of injury.     Cervical back: Normal range of motion.  Skin:    Coloration: Skin is not jaundiced or pale.     Comments: Left upper extremity previous left upper extremity graft site with sutures in place, scant bleeding between 2 of the suture areas, no active arterial bleeding  Neurological:     General: No focal deficit present.     Mental Status: He is alert and oriented to person, place, and time. Mental status is at baseline.  Psychiatric:        Mood and Affect: Mood normal.  Behavior: Behavior normal.     LABS (all labs ordered are listed, but only abnormal results are displayed)  Labs Reviewed  COMPREHENSIVE METABOLIC PANEL - Abnormal; Notable for the following components:      Result Value   BUN 22 (*)    Creatinine, Ser 3.73 (*)    Albumin 3.2 (*)    Total Bilirubin 1.5 (*)    GFR, Estimated 19 (*)    All other components within normal limits  CBC WITH DIFFERENTIAL/PLATELET - Abnormal; Notable for the following components:   WBC 10.8 (*)    RBC 2.54 (*)    Hemoglobin 7.8 (*)    HCT 23.6 (*)    RDW 17.3 (*)    Neutro Abs 8.5 (*)    Abs Immature Granulocytes 0.34 (*)    All other components within normal limits  BRAIN NATRIURETIC PEPTIDE - Abnormal; Notable for the following components:   B Natriuretic Peptide >4,500.0 (*)    All other components within normal limits  RESP PANEL BY RT-PCR (FLU A&B, COVID) ARPGX2  MAGNESIUM  PHOSPHORUS   ____________________________________________  EKG  Sinus tachycardia, normal axis, normal intervals, no acute ischemic changes ____________________________________________  RADIOLOGY I, Madelin Headings, personally viewed and evaluated these images (plain radiographs) as part of my medical decision making, as well as reviewing the written report by the radiologist.  ED MD interpretation: I reviewed the chest x-ray which shows cardiomegaly and pulmonary edema which is rather stable from  prior    ____________________________________________   PROCEDURES  Procedure(s) performed (including Critical Care):  Procedures   ____________________________________________   INITIAL IMPRESSION / ASSESSMENT AND PLAN / ED COURSE     Patient is a 55 year old male who is dialysis dependent and frequently noncompliant who presents with some bleeding from a site of a recently removed AV graft.  On arrival the bandaging was taken down and there was scant bleeding from the suture sites from where the graft was recently removed and closed.  There is no arterial bleeding.  Patient is notably somewhat tachypneic and saturating in the mid 80s on room air so was placed on 3 L nasal cannula.  Tells me that he is sometimes on oxygen at home.  Patient currently does not have any dialysis access.  Tells me that he was supposed to come to the hospital today to have a graft placed.  On looking through his last hospitalization patient ultimately left AMA and his temporary dialysis catheter was removed prior to him leaving.  He was last dialyzed yesterday.  His labs are rather reassuring, no hyperkalemia.  However he does have ongoing pulmonary edema on his chest x-ray which she always has but now has a new oxygen requirement.  I am concerned that he has no plan for dialysis.  Will discuss with nephrology.  Discussed with Dr. Candiss Norse who notes that the patient was supposed to have a PermCath placed prior to leaving.  He recommends that we get the PermCath placed now and then the patient can be discharged.  The time of signout he is pending vascular consult.    Clinical Course as of 07/23/21 0711  Tue Jul 23, 2021  0539 Hemoglobin(!): 7.8 [KM]    Clinical Course User Index [KM] Rada Hay, MD     ____________________________________________   FINAL CLINICAL IMPRESSION(S) / ED DIAGNOSES  Final diagnoses:  ESRD (end stage renal disease) [facility])     ED Discharge Orders     None  Note:  This document was prepared using Dragon voice recognition software and may include unintentional dictation errors.    Rada Hay, MD 07/23/21 [phone]

## 2021-07-23 NOTE — ED Provider Notes (Signed)
-----------------------------------------   7:23 AM on 07/23/2021 ----------------------------------------- I spoke with vascular surgery (Dr.Dew).  They will arrange for an outpatient PermCath to be placed either today or tomorrow for the patient.  Lab work is reassuring, potassium 4.1 does not need emergent dialysis.  Patient's bleeding from his prior catheter is hemostatic.  I believe the patient could be safely discharged home with outpatient vascular follow-up.   Harvest Dark, MD 07/23/21 [phone]

## 2021-07-23 NOTE — Telephone Encounter (Signed)
I attempted to contact the patient to schedule a permcath insertion and was unable to make contact with the patient due to his phone being disconnected or number changed. I attempted to contact the brother and was unable to leave a message, I then called the sister and left a message with her to have the patient to call to schedule his procedure.

## 2021-07-23 NOTE — ED Triage Notes (Signed)
Pt had fistula taken out yesterday. Has suture line on l arm. Called ems after blood began to seep through and he could not stop it.

## 2021-07-23 NOTE — ED Notes (Signed)
Pt will be discharged. Pt states has no one to call "they're all at work" for ride home and does not know how to take city bus. Address in [place] verified. Taxi voucher obtained from CN to facilitate transport home.

## 2021-07-23 NOTE — ED Notes (Signed)
[name] taxi service called and on way.  Pt states only wears oxygen "on and off" and will not need continuous oxygen while waiting for taxi to arrive.

## 2021-07-24 LAB — BPAM RBC
Blood Product Expiration Date: 202211202359
Blood Product Expiration Date: 202211202359
Blood Product Expiration Date: 202211202359
ISSUE DATE / TIME: 202210261715
ISSUE DATE / TIME: 202210301424
Unit Type and Rh: 9500
Unit Type and Rh: 9500
Unit Type and Rh: 9500

## 2021-07-24 LAB — CULTURE, BLOOD (ROUTINE X 2)
Culture: NO GROWTH
Culture: NO GROWTH
Culture: NO GROWTH
Culture: NO GROWTH
Special Requests: ADEQUATE
Special Requests: ADEQUATE

## 2021-07-24 LAB — TYPE AND SCREEN
ABO/RH(D): O NEG
Antibody Screen: POSITIVE
Donor AG Type: NEGATIVE
Unit division: 0
Unit division: 0
Unit division: 0

## 2021-07-24 LAB — PREPARE RBC (CROSSMATCH)

## 2021-07-25 NOTE — Telephone Encounter (Addendum)
I have attempted to contact the patient to schedule him for a permcath insertions with Dr.  Lucky Cowboy on 07/26/21 with a 6:45 am arrival time to the MM. A message has been left for a return call with the numbers he gave to contact him.

## 2021-07-26 ENCOUNTER — Ambulatory Visit
Admission: RE | Admit: 2021-07-26 | Discharge: 2021-07-26 | Disposition: A | Discharge status: Home / Self Care | Payer: Medicare Other | Attending: Vascular Surgery | Admitting: Vascular Surgery

## 2021-07-26 ENCOUNTER — Encounter: Payer: Self-pay | Admitting: Vascular Surgery

## 2021-07-26 ENCOUNTER — Encounter
Admission: RE | Disposition: A | Discharge status: Home / Self Care | Payer: Self-pay | Source: Home / Self Care | Attending: Vascular Surgery

## 2021-07-26 ENCOUNTER — Other Ambulatory Visit (INDEPENDENT_AMBULATORY_CARE_PROVIDER_SITE_OTHER): Payer: Self-pay | Admitting: Nurse Practitioner

## 2021-07-26 DIAGNOSIS — N186 End stage renal disease: Secondary | ICD-10-CM | POA: Diagnosis present

## 2021-07-26 DIAGNOSIS — Z992 Dependence on renal dialysis: Secondary | ICD-10-CM | POA: Diagnosis not present

## 2021-07-26 HISTORY — PX: DIALYSIS/PERMA CATHETER INSERTION: CATH118288

## 2021-07-26 LAB — CBC
HCT: 25.1 % — ABNORMAL LOW (ref 39.0–52.0)
Hemoglobin: 7.7 g/dL — ABNORMAL LOW (ref 13.0–17.0)
MCH: 30.2 pg (ref 26.0–34.0)
MCHC: 30.7 g/dL (ref 30.0–36.0)
MCV: 98.4 fL (ref 80.0–100.0)
Platelets: 173 10*3/uL (ref 150–400)
RBC: 2.55 MIL/uL — ABNORMAL LOW (ref 4.22–5.81)
RDW: 17.5 % — ABNORMAL HIGH (ref 11.5–15.5)
WBC: 11.6 10*3/uL — ABNORMAL HIGH (ref 4.0–10.5)
nRBC: 0 % (ref 0.0–0.2)

## 2021-07-26 LAB — POTASSIUM (ARMC VASCULAR LAB ONLY): Potassium (ARMC vascular lab): 5.1 (ref 3.5–5.1)

## 2021-07-26 SURGERY — DIALYSIS/PERMA CATHETER INSERTION
Anesthesia: Moderate Sedation

## 2021-07-26 MED ORDER — FENTANYL CITRATE (PF) 100 MCG/2ML IJ SOLN
INTRAMUSCULAR | Status: AC
  Filled 2021-07-26: qty 2

## 2021-07-26 MED ORDER — DIPHENHYDRAMINE HCL 50 MG/ML IJ SOLN: 50.0000 mg | Freq: Once | INTRAMUSCULAR | Status: DC | PRN

## 2021-07-26 MED ORDER — SODIUM CHLORIDE 0.9 % IV SOLN
INTRAVENOUS | Status: DC
  Administered 2021-07-26: 10 mL via INTRAVENOUS

## 2021-07-26 MED ORDER — CHLORHEXIDINE GLUCONATE CLOTH 2 % EX PADS
6.0000 | MEDICATED_PAD | Freq: Every day | CUTANEOUS | Status: DC
  Administered 2021-07-26: 6 via TOPICAL

## 2021-07-26 MED ORDER — MIDAZOLAM HCL 2 MG/2ML IJ SOLN
INTRAMUSCULAR | Status: DC | PRN
  Administered 2021-07-26: 2 mg via INTRAVENOUS

## 2021-07-26 MED ORDER — ONDANSETRON HCL 4 MG/2ML IJ SOLN: 4.0000 mg | Freq: Four times a day (QID) | INTRAMUSCULAR | Status: DC | PRN

## 2021-07-26 MED ORDER — METHYLPREDNISOLONE SODIUM SUCC 125 MG IJ SOLR: 125.0000 mg | Freq: Once | INTRAMUSCULAR | Status: DC | PRN

## 2021-07-26 MED ORDER — FENTANYL CITRATE (PF) 100 MCG/2ML IJ SOLN
INTRAMUSCULAR | Status: DC | PRN
  Administered 2021-07-26: 50 ug via INTRAVENOUS

## 2021-07-26 MED ORDER — CEFAZOLIN SODIUM-DEXTROSE 1-4 GM/50ML-% IV SOLN
1.0000 g | Freq: Once | INTRAVENOUS | Status: AC
  Administered 2021-07-26: 1 g via INTRAVENOUS

## 2021-07-26 MED ORDER — MIDAZOLAM HCL 2 MG/ML PO SYRP: 8.0000 mg | ORAL_SOLUTION | Freq: Once | ORAL | Status: DC | PRN

## 2021-07-26 MED ORDER — MIDAZOLAM HCL 2 MG/2ML IJ SOLN
INTRAMUSCULAR | Status: AC
  Filled 2021-07-26: qty 2

## 2021-07-26 MED ORDER — FAMOTIDINE 20 MG PO TABS: 40.0000 mg | ORAL_TABLET | Freq: Once | ORAL | Status: DC | PRN

## 2021-07-26 MED ORDER — HYDROMORPHONE HCL 1 MG/ML IJ SOLN: 1.0000 mg | Freq: Once | INTRAMUSCULAR | Status: DC | PRN

## 2021-07-26 SURGICAL SUPPLY — 9 items
ADH SKN CLS APL DERMABOND .7 (GAUZE/BANDAGES/DRESSINGS) ×1
CATH CANNON HEMO 15FR 19 (HEMODIALYSIS SUPPLIES) ×1 IMPLANT
COVER PROBE U/S 5X48 (MISCELLANEOUS) ×1 IMPLANT
DERMABOND ADVANCED (GAUZE/BANDAGES/DRESSINGS) ×1
DERMABOND ADVANCED .7 DNX12 (GAUZE/BANDAGES/DRESSINGS) IMPLANT
PACK ANGIOGRAPHY (CUSTOM PROCEDURE TRAY) ×1 IMPLANT
SUT MNCRL AB 4-0 PS2 18 (SUTURE) ×1 IMPLANT
SUT PROLENE 0 CT 1 30 (SUTURE) ×1 IMPLANT
TOWEL OR 17X26 4PK STRL BLUE (TOWEL DISPOSABLE) ×1 IMPLANT

## 2021-07-26 NOTE — Interval H&P Note (Signed)
History and Physical Interval Note:  07/26/2021 8:24 AM  Jordan [name]  has presented today for surgery, with the diagnosis of Perma Cath Insertion   End Stage Renal.  The various methods of treatment have been discussed with the patient and family. After consideration of risks, benefits and other options for treatment, the patient has consented to  Procedure(s): DIALYSIS/PERMA CATHETER INSERTION (N/A) as a surgical intervention.  The patient's history has been reviewed, patient examined, no change in status, stable for surgery.  I have reviewed the patient's chart and labs.  Questions were answered to the patient's satisfaction.     Leotis Pain

## 2021-07-26 NOTE — Op Note (Signed)
OPERATIVE NOTE    PRE-OPERATIVE DIAGNOSIS: 1. ESRD 2. Recent removal of infected arm access  POST-OPERATIVE DIAGNOSIS: same as above  PROCEDURE: Ultrasound guidance for vascular access to the right internal jugular vein Fluoroscopic guidance for placement of catheter Placement of a 19 cm tip to cuff tunneled hemodialysis catheter via the right internal jugular vein  SURGEON: Leotis Pain, MD  ANESTHESIA:  Local with Moderate conscious sedation for approximately 15 minutes using 2 mg of Versed and 50 mcg of Fentanyl  ESTIMATED BLOOD LOSS: 10 cc  FLUORO TIME: less than one minute  CONTRAST: none  FINDING(S): 1.  Patent right internal jugular vein  SPECIMEN(S):  None  INDICATIONS:   Jordan [name] is a 55 y.o.male who presents with renal failure and recent removal of his extremity access.  The patient needs long term dialysis access for their ESRD, and a Permcath is necessary.  Risks and benefits are discussed and informed consent is obtained.    DESCRIPTION: After obtaining full informed written consent, the patient was brought back to the vascular suited. The patient's right neck and chest were sterilely prepped and draped in a sterile surgical field was created. Moderate conscious sedation was administered during a face to face encounter with the patient throughout the procedure with my supervision of the RN administering medicines and monitoring the patient's vital signs, pulse oximetry, telemetry and mental status throughout from the start of the procedure until the patient was taken to the recovery room.  The right internal jugular vein was visualized with ultrasound and found to be patent. It was then accessed under direct ultrasound guidance and a permanent image was recorded. A wire was placed. After skin nick and dilatation, the peel-away sheath was placed over the wire. I then turned my attention to an area under the clavicle. Approximately 1-2 fingerbreadths below the  clavicle a small counterincision was created and tunneled from the subclavicular incision to the access site. Using fluoroscopic guidance, a 19 centimeter tip to cuff tunneled hemodialysis catheter was selected, and tunneled from the subclavicular incision to the access site. It was then placed through the peel-away sheath and the peel-away sheath was removed. Using fluoroscopic guidance the catheter tips were parked in the right atrium. The appropriate distal connectors were placed. It withdrew blood well and flushed easily with heparinized saline and a concentrated heparin solution was then placed. It was secured to the chest wall with 2 Prolene sutures. The access incision was closed single 4-0 Monocryl. A 4-0 Monocryl pursestring suture was placed around the exit site. Sterile dressings were placed. The patient tolerated the procedure well and was taken to the recovery room in stable condition.  COMPLICATIONS: None  CONDITION: Stable  Leotis Pain, MD 07/26/2021 8:57 AM   This note was created with Dragon Medical transcription system. Any errors in dictation are purely unintentional.

## 2021-07-30 ENCOUNTER — Emergency Department: Payer: Medicare Other

## 2021-07-30 ENCOUNTER — Encounter: Payer: Self-pay | Admitting: Vascular Surgery

## 2021-07-30 ENCOUNTER — Other Ambulatory Visit: Payer: Self-pay

## 2021-07-30 ENCOUNTER — Inpatient Hospital Stay
Admission: EM | Admit: 2021-07-30 | Discharge: 2021-08-01 | DRG: 640 | Discharge status: Against Medical Advice | Payer: Medicare Other | Attending: Obstetrics and Gynecology | Admitting: Obstetrics and Gynecology

## 2021-07-30 DIAGNOSIS — J189 Pneumonia, unspecified organism: Secondary | ICD-10-CM | POA: Diagnosis present

## 2021-07-30 DIAGNOSIS — I480 Paroxysmal atrial fibrillation: Secondary | ICD-10-CM | POA: Diagnosis present

## 2021-07-30 DIAGNOSIS — I132 Hypertensive heart and chronic kidney disease with heart failure and with stage 5 chronic kidney disease, or end stage renal disease: Secondary | ICD-10-CM | POA: Diagnosis present

## 2021-07-30 DIAGNOSIS — R0602 Shortness of breath: Secondary | ICD-10-CM | POA: Diagnosis present

## 2021-07-30 DIAGNOSIS — D696 Thrombocytopenia, unspecified: Secondary | ICD-10-CM | POA: Diagnosis present

## 2021-07-30 DIAGNOSIS — Z79899 Other long term (current) drug therapy: Secondary | ICD-10-CM

## 2021-07-30 DIAGNOSIS — I5042 Chronic combined systolic (congestive) and diastolic (congestive) heart failure: Secondary | ICD-10-CM | POA: Diagnosis present

## 2021-07-30 DIAGNOSIS — D631 Anemia in chronic kidney disease: Secondary | ICD-10-CM | POA: Diagnosis present

## 2021-07-30 DIAGNOSIS — Z6825 Body mass index (BMI) 25.0-25.9, adult: Secondary | ICD-10-CM

## 2021-07-30 DIAGNOSIS — L508 Other urticaria: Secondary | ICD-10-CM | POA: Diagnosis present

## 2021-07-30 DIAGNOSIS — Z9115 Patient's noncompliance with renal dialysis: Secondary | ICD-10-CM

## 2021-07-30 DIAGNOSIS — R7881 Bacteremia: Secondary | ICD-10-CM | POA: Diagnosis present

## 2021-07-30 DIAGNOSIS — J9601 Acute respiratory failure with hypoxia: Secondary | ICD-10-CM | POA: Diagnosis present

## 2021-07-30 DIAGNOSIS — W57XXXA Bitten or stung by nonvenomous insect and other nonvenomous arthropods, initial encounter: Secondary | ICD-10-CM | POA: Diagnosis present

## 2021-07-30 DIAGNOSIS — E8779 Other fluid overload: Principal | ICD-10-CM | POA: Diagnosis present

## 2021-07-30 DIAGNOSIS — T8130XA Disruption of wound, unspecified, initial encounter: Secondary | ICD-10-CM | POA: Diagnosis present

## 2021-07-30 DIAGNOSIS — N2581 Secondary hyperparathyroidism of renal origin: Secondary | ICD-10-CM | POA: Diagnosis present

## 2021-07-30 DIAGNOSIS — F17210 Nicotine dependence, cigarettes, uncomplicated: Secondary | ICD-10-CM | POA: Diagnosis present

## 2021-07-30 DIAGNOSIS — Z20822 Contact with and (suspected) exposure to covid-19: Secondary | ICD-10-CM | POA: Diagnosis present

## 2021-07-30 DIAGNOSIS — Z634 Disappearance and death of family member: Secondary | ICD-10-CM

## 2021-07-30 DIAGNOSIS — R300 Dysuria: Secondary | ICD-10-CM | POA: Diagnosis present

## 2021-07-30 DIAGNOSIS — K219 Gastro-esophageal reflux disease without esophagitis: Secondary | ICD-10-CM | POA: Diagnosis present

## 2021-07-30 DIAGNOSIS — F1010 Alcohol abuse, uncomplicated: Secondary | ICD-10-CM | POA: Diagnosis present

## 2021-07-30 DIAGNOSIS — Y832 Surgical operation with anastomosis, bypass or graft as the cause of abnormal reaction of the patient, or of later complication, without mention of misadventure at the time of the procedure: Secondary | ICD-10-CM | POA: Diagnosis present

## 2021-07-30 DIAGNOSIS — Z841 Family history of disorders of kidney and ureter: Secondary | ICD-10-CM

## 2021-07-30 DIAGNOSIS — Z992 Dependence on renal dialysis: Secondary | ICD-10-CM

## 2021-07-30 DIAGNOSIS — Z91158 Patient's noncompliance with renal dialysis for other reason: Secondary | ICD-10-CM

## 2021-07-30 DIAGNOSIS — Z5329 Procedure and treatment not carried out because of patient's decision for other reasons: Secondary | ICD-10-CM | POA: Diagnosis not present

## 2021-07-30 DIAGNOSIS — N186 End stage renal disease: Secondary | ICD-10-CM

## 2021-07-30 DIAGNOSIS — E43 Unspecified severe protein-calorie malnutrition: Secondary | ICD-10-CM | POA: Diagnosis present

## 2021-07-30 DIAGNOSIS — N185 Chronic kidney disease, stage 5: Secondary | ICD-10-CM

## 2021-07-30 DIAGNOSIS — E8770 Fluid overload, unspecified: Secondary | ICD-10-CM | POA: Diagnosis present

## 2021-07-30 DIAGNOSIS — I1311 Hypertensive heart and chronic kidney disease without heart failure, with stage 5 chronic kidney disease, or end stage renal disease: Secondary | ICD-10-CM | POA: Diagnosis present

## 2021-07-30 DIAGNOSIS — J439 Emphysema, unspecified: Secondary | ICD-10-CM | POA: Diagnosis present

## 2021-07-30 DIAGNOSIS — Z720 Tobacco use: Secondary | ICD-10-CM | POA: Diagnosis present

## 2021-07-30 DIAGNOSIS — Z833 Family history of diabetes mellitus: Secondary | ICD-10-CM

## 2021-07-30 DIAGNOSIS — T827XXA Infection and inflammatory reaction due to other cardiac and vascular devices, implants and grafts, initial encounter: Secondary | ICD-10-CM | POA: Diagnosis present

## 2021-07-30 DIAGNOSIS — I509 Heart failure, unspecified: Secondary | ICD-10-CM

## 2021-07-30 DIAGNOSIS — I5043 Acute on chronic combined systolic (congestive) and diastolic (congestive) heart failure: Secondary | ICD-10-CM

## 2021-07-30 LAB — CBC WITH DIFFERENTIAL/PLATELET
Abs Immature Granulocytes: 0.07 10*3/uL (ref 0.00–0.07)
Basophils Absolute: 0.1 10*3/uL (ref 0.0–0.1)
Basophils Relative: 1 %
Eosinophils Absolute: 0.1 10*3/uL (ref 0.0–0.5)
Eosinophils Relative: 1 %
HCT: 22.9 % — ABNORMAL LOW (ref 39.0–52.0)
Hemoglobin: 6.9 g/dL — ABNORMAL LOW (ref 13.0–17.0)
Immature Granulocytes: 1 %
Lymphocytes Relative: 7 %
Lymphs Abs: 0.6 10*3/uL — ABNORMAL LOW (ref 0.7–4.0)
MCH: 30.1 pg (ref 26.0–34.0)
MCHC: 30.1 g/dL (ref 30.0–36.0)
MCV: 100 fL (ref 80.0–100.0)
Monocytes Absolute: 0.6 10*3/uL (ref 0.1–1.0)
Monocytes Relative: 7 %
Neutro Abs: 6.9 10*3/uL (ref 1.7–7.7)
Neutrophils Relative %: 83 %
Platelets: 185 10*3/uL (ref 150–400)
RBC: 2.29 MIL/uL — ABNORMAL LOW (ref 4.22–5.81)
RDW: 17.6 % — ABNORMAL HIGH (ref 11.5–15.5)
WBC: 8.4 10*3/uL (ref 4.0–10.5)
nRBC: 0 % (ref 0.0–0.2)

## 2021-07-30 LAB — COMPREHENSIVE METABOLIC PANEL
ALT: 7 U/L (ref 0–44)
AST: 22 U/L (ref 15–41)
Albumin: 3 g/dL — ABNORMAL LOW (ref 3.5–5.0)
Alkaline Phosphatase: 112 U/L (ref 38–126)
Anion gap: 15 (ref 5–15)
BUN: 52 mg/dL — ABNORMAL HIGH (ref 6–20)
CO2: 28 mmol/L (ref 22–32)
Calcium: 9.1 mg/dL (ref 8.9–10.3)
Chloride: 105 mmol/L (ref 98–111)
Creatinine, Ser: 9.62 mg/dL — ABNORMAL HIGH (ref 0.61–1.24)
GFR, Estimated: 6 mL/min — ABNORMAL LOW (ref >60)
Glucose, Bld: 110 mg/dL — ABNORMAL HIGH (ref 70–99)
Potassium: 4.8 mmol/L (ref 3.5–5.1)
Sodium: 148 mmol/L — ABNORMAL HIGH (ref 135–145)
Total Bilirubin: 1.1 mg/dL (ref 0.3–1.2)
Total Protein: 7.3 g/dL (ref 6.5–8.1)

## 2021-07-30 LAB — PROTIME-INR
INR: 1.3 — ABNORMAL HIGH (ref 0.8–1.2)
Prothrombin Time: 16.1 seconds — ABNORMAL HIGH (ref 11.4–15.2)

## 2021-07-30 LAB — RESP PANEL BY RT-PCR (FLU A&B, COVID) ARPGX2
Influenza A by PCR: NEGATIVE
Influenza B by PCR: NEGATIVE
SARS Coronavirus 2 by RT PCR: NEGATIVE

## 2021-07-30 LAB — PROCALCITONIN: Procalcitonin: 2.69 ng/mL

## 2021-07-30 IMAGING — DX DG CHEST 1V PORT
1 series · 1 of 1 positions shown · non-contrast
Comparison: None.
COMPARISON: None.

Addendum:
CLINICAL DATA: Chest pain

EXAM:
PORTABLE CHEST 1 VIEW

[chest ap]
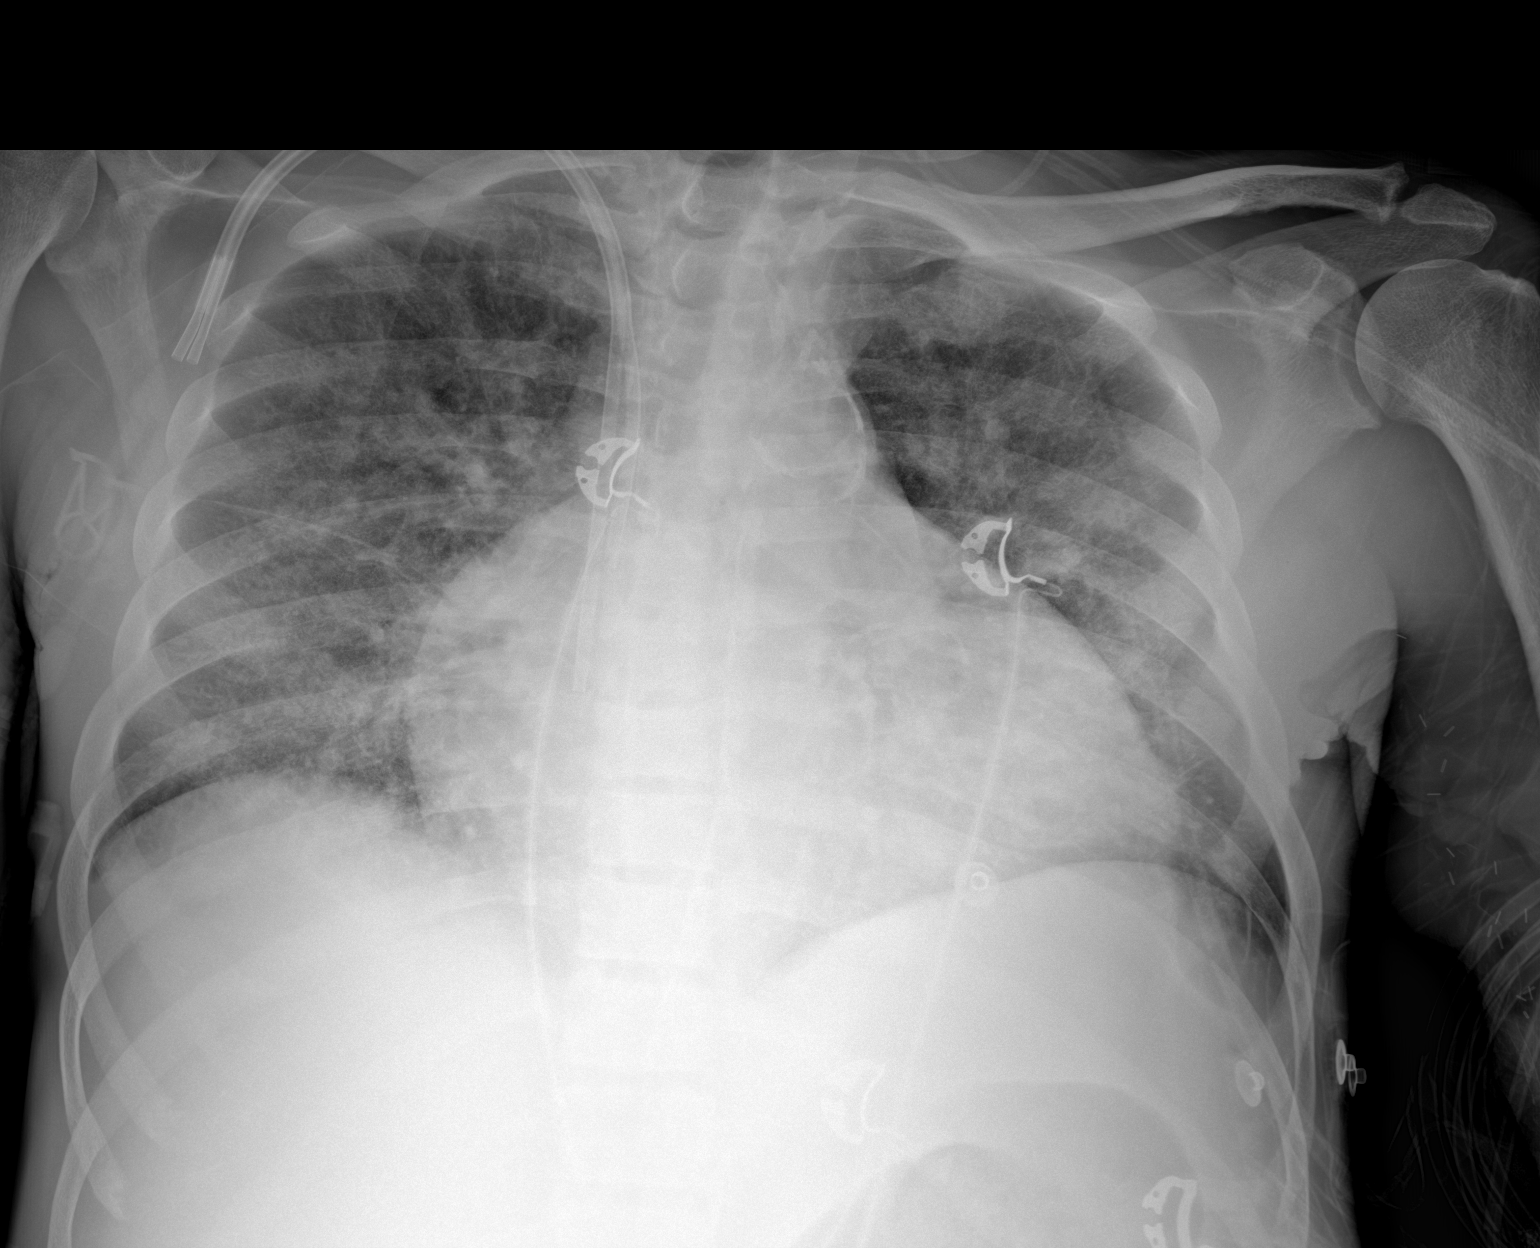

[1 of 1 positions shown; findings below may reference images not displayed]

FINDINGS: The heart and mediastinal contours are within normal limits.
Vascular clips in sternotomy wires overlie the mediastinum.

No focal consolidation. No pulmonary edema. No pleural effusion. No
pneumothorax.

No acute osseous abnormality.
IMPRESSION: No active disease.

ADDENDUM:
ADDENDUM
PLEASE NOTE INCORRECT DICTATION PRIOR.

CORRECT COMPARISON:
Chest x-ray [DATE]

CORRECT FINDINGS:
CORRECT FINDINGS
Right chest wall dialysis catheter with tip overlying the right
atrium.

Cardiomegaly. The heart and mediastinal contours are unchanged.
Aortic calcification.

No diffuse patchy airspace and interstitial opacities. No pleural
effusion. No pneumothorax.

No acute osseous abnormality.

CORRECT IMPRESSION:
Cardiomegaly with pulmonary edema. No pleural effusions. Underlying
pericardial effusion is not excluded.

*** End of Addendum ***
FINDINGS: The heart and mediastinal contours are within normal limits.
Vascular clips in sternotomy wires overlie the mediastinum.

No focal consolidation. No pulmonary edema. No pleural effusion. No
pneumothorax.

No acute osseous abnormality.
IMPRESSION: No active disease.

## 2021-07-30 MED ORDER — FUROSEMIDE 10 MG/ML IJ SOLN
80.0000 mg | Freq: Once | INTRAMUSCULAR | Status: AC
  Administered 2021-07-30: 80 mg via INTRAVENOUS
  Filled 2021-07-30: qty 8

## 2021-07-30 MED ORDER — DIPHENHYDRAMINE HCL 50 MG/ML IJ SOLN
12.5000 mg | Freq: Four times a day (QID) | INTRAMUSCULAR | Status: AC | PRN
  Administered 2021-07-30 – 2021-07-31 (×2): 12.5 mg via INTRAVENOUS
  Filled 2021-07-30 (×2): qty 1

## 2021-07-30 MED ORDER — ACETAMINOPHEN 650 MG RE SUPP
650.0000 mg | Freq: Four times a day (QID) | RECTAL | Status: DC | PRN
  Filled 2021-07-30: qty 1

## 2021-07-30 MED ORDER — ONDANSETRON HCL 4 MG/2ML IJ SOLN
4.0000 mg | Freq: Four times a day (QID) | INTRAMUSCULAR | Status: DC | PRN
  Administered 2021-07-30: 4 mg via INTRAVENOUS
  Filled 2021-07-30: qty 2

## 2021-07-30 MED ORDER — METOPROLOL TARTRATE 50 MG PO TABS
50.0000 mg | ORAL_TABLET | Freq: Two times a day (BID) | ORAL | Status: DC
  Administered 2021-07-30 – 2021-07-31 (×2): 50 mg via ORAL
  Filled 2021-07-30 (×3): qty 1

## 2021-07-30 MED ORDER — FERROUS SULFATE 325 (65 FE) MG PO TABS
325.0000 mg | ORAL_TABLET | Freq: Two times a day (BID) | ORAL | Status: DC
  Administered 2021-07-30 – 2021-07-31 (×3): 325 mg via ORAL
  Filled 2021-07-30 (×4): qty 1

## 2021-07-30 MED ORDER — PANTOPRAZOLE SODIUM 20 MG PO TBEC: 20.0000 mg | DELAYED_RELEASE_TABLET | Freq: Two times a day (BID) | ORAL | Status: DC

## 2021-07-30 MED ORDER — ACETAMINOPHEN 325 MG PO TABS: 650.0000 mg | ORAL_TABLET | Freq: Four times a day (QID) | ORAL | Status: DC | PRN

## 2021-07-30 MED ORDER — DIPHENHYDRAMINE-ZINC ACETATE 2-0.1 % EX CREA
TOPICAL_CREAM | Freq: Every day | CUTANEOUS | Status: DC | PRN
  Filled 2021-07-30: qty 28

## 2021-07-30 MED ORDER — PANTOPRAZOLE SODIUM 40 MG PO TBEC
40.0000 mg | DELAYED_RELEASE_TABLET | Freq: Every day | ORAL | Status: DC
  Administered 2021-07-30 – 2021-07-31 (×2): 40 mg via ORAL
  Filled 2021-07-30 (×3): qty 1

## 2021-07-30 MED ORDER — SODIUM CHLORIDE 0.9 % IV SOLN: 1.0000 g | INTRAVENOUS | Status: DC

## 2021-07-30 MED ORDER — HEPARIN SODIUM (PORCINE) 5000 UNIT/ML IJ SOLN
5000.0000 [IU] | Freq: Three times a day (TID) | INTRAMUSCULAR | Status: DC
  Administered 2021-07-30 – 2021-07-31 (×3): 5000 [IU] via SUBCUTANEOUS
  Filled 2021-07-30 (×4): qty 1

## 2021-07-30 MED ORDER — ALBUTEROL SULFATE (2.5 MG/3ML) 0.083% IN NEBU: 2.5000 mg | INHALATION_SOLUTION | RESPIRATORY_TRACT | Status: DC | PRN

## 2021-07-30 MED ORDER — CALCIUM ACETATE (PHOS BINDER) 667 MG PO CAPS
1334.0000 mg | ORAL_CAPSULE | Freq: Three times a day (TID) | ORAL | Status: DC
  Administered 2021-07-31 (×2): 1334 mg via ORAL
  Filled 2021-07-30 (×5): qty 2

## 2021-07-30 MED ORDER — SODIUM CHLORIDE 0.9 % IV SOLN
500.0000 mg | INTRAVENOUS | Status: DC
  Administered 2021-07-30: 500 mg via INTRAVENOUS
  Filled 2021-07-30: qty 500

## 2021-07-30 MED ORDER — DOCUSATE SODIUM 100 MG PO CAPS
100.0000 mg | ORAL_CAPSULE | Freq: Two times a day (BID) | ORAL | Status: DC
  Administered 2021-07-31 (×2): 100 mg via ORAL
  Filled 2021-07-30 (×3): qty 1

## 2021-07-30 MED ORDER — ONDANSETRON HCL 4 MG PO TABS: 4.0000 mg | ORAL_TABLET | Freq: Four times a day (QID) | ORAL | Status: DC | PRN

## 2021-07-30 MED ORDER — ALBUTEROL SULFATE HFA 108 (90 BASE) MCG/ACT IN AERS: 2.0000 | INHALATION_SPRAY | RESPIRATORY_TRACT | Status: DC | PRN

## 2021-07-30 MED ORDER — SODIUM CHLORIDE 0.9 % IV SOLN
2.0000 g | INTRAVENOUS | Status: DC
  Administered 2021-07-30: 2 g via INTRAVENOUS
  Filled 2021-07-30: qty 20

## 2021-07-30 MED ORDER — CINACALCET HCL 30 MG PO TABS
30.0000 mg | ORAL_TABLET | Freq: Every day | ORAL | Status: DC
  Administered 2021-07-31: 30 mg via ORAL
  Filled 2021-07-30 (×4): qty 1

## 2021-07-30 NOTE — ED Notes (Signed)
Patient given 1 milk and 1 apple juice.  2 sets of graham crackers and peanut butter.  Patient's RN notified.

## 2021-07-30 NOTE — ED Provider Notes (Signed)
[facility] Emergency Department Provider Note  ____________________________________________  Time seen: Approximately 1:00 PM  I have reviewed the triage vital signs and the nursing notes.   HISTORY  Chief Complaint Shortness of Breath    HPI Jordan [name] is a 55 y.o. male With a history of hypertension, ESRD on hemodialysis Tuesday Thursday Saturday, emphysema who comes ED complaining of shortness of breath is been gradual onset and worsening for the past 2 days.  Reports last dialysis was 3 days ago on Saturday.  Given nitroglycerin spray by EMS without relief.  Placed on nonrebreather oxygen by EMS due to hypoxia of 78%.    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 ([place]) 07/17/2021   AV fistula infection, 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/17/2021   Bacteremia 07/17/2021   AV fistula thrombosis, 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/17/2021   Pressure injury of skin 07/15/2021   Acute on chronic respiratory failure with hypoxemia ([place]) 07/12/2021   Ascites    Protein-calorie malnutrition, severe 06/05/2021   Hypervolemia associated with renal insufficiency 06/02/2021   Symptomatic anemia 05/28/2021   Acute on chronic diastolic CHF (congestive heart failure) ([place]) 05/28/2021   Hyperkalemia 05/28/2021   Pulmonary edema 04/29/2021   Pruritus 04/29/2021   COVID-19 virus infection 04/29/2021   Thrombocytopenia ([place]) 04/08/2021   COPD (chronic obstructive pulmonary disease) ([place]) 04/08/2021   CHF (congestive heart failure) ([place]) 03/25/2021   PAF (paroxysmal  atrial fibrillation) ([place]) 03/20/2021   Chronic diastolic CHF (congestive heart failure) ([place]) 03/20/2021   Alcohol abuse 03/20/2021   Vision loss of left eye 03/20/2021   SOB (shortness of breath) 03/20/2021   Shortness of breath 03/10/2021   Abscess of buttock 03/02/2021   Fluid overload 03/02/2021   Multifocal pneumonia 01/13/2021   Current mild episode of major depressive disorder without prior episode ([place]) 04/09/2020   Atrial fibrillation with RVR ([place]) 04/04/2020   Non-compliance with renal dialysis ([place]) 03/21/2020   Acute CHF (congestive heart failure) ([place]) 03/12/2020   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 ([place]) 02/03/2020   Acute on chronic combined systolic and diastolic CHF (congestive heart failure) ([place]) 02/03/2020   Acute on chronic respiratory failure with hypoxia ([place]) 11/12/2019   Aortic atherosclerosis ([place]) 11/12/2019   Emphysema of lung ([place]) 11/12/2019   Volume overload 10/05/2019   Elevated troponin 05/17/2018   Anemia in ESRD (end-stage renal disease) ([place]) 05/17/2018   ESRD on dialysis ([place]) 05/26/2017   Essential hypertension 05/26/2017   Moderate protein-calorie malnutrition ([place]) 08/13/2016   Secondary hyperparathyroidism of renal origin ([place]) 08/13/2016   Tobacco abuse 08/04/2016     Past Surgical History:  Procedure Laterality Date   A/V FISTULAGRAM Left 07/15/2021   Procedure: A/V FISTULAGRAM;  Surgeon: Algernon Huxley, MD;  Location: [place] [facility];  Service: Cardiovascular;  Laterality: Left;   BASCILIC VEIN TRANSPOSITION Left 08/07/2016   Procedure: LEFT BASILIC VEIN TRANSPOSITION;  Surgeon: Angelia Mould, MD;  Location: [place];  Service: Vascular;  Laterality: Left;   CLOSED REDUCTION  NASAL FRACTURE N/A 08/11/2019   Procedure: CLOSED REDUCTION NASAL FRACTURE WITH STABILIZATION;  Surgeon: Irene Limbo, MD;  Location: [place];  Service: Plastics;  Laterality: N/A;   COLONOSCOPY N/A 08/12/2016   Procedure:  COLONOSCOPY;  Surgeon: Otis Brace, MD;  Location: [place];  Service: Gastroenterology;  Laterality: N/A;   COLONOSCOPY WITH PROPOFOL N/A 06/05/2021   Procedure: COLONOSCOPY WITH PROPOFOL;  Surgeon: Sharyn Creamer, MD;  Location: [place];  Service: Gastroenterology;  Laterality: N/A;   DIALYSIS/PERMA CATHETER INSERTION N/A 07/26/2021   Procedure: DIALYSIS/PERMA CATHETER INSERTION;  Surgeon: Algernon Huxley, MD;  Location: [place] [facility];  Service: Cardiovascular;  Laterality: N/A;   ESOPHAGOGASTRODUODENOSCOPY N/A 08/12/2016   Procedure: ESOPHAGOGASTRODUODENOSCOPY (EGD);  Surgeon: Otis Brace, MD;  Location: [place];  Service: Gastroenterology;  Laterality: N/A;   ESOPHAGOGASTRODUODENOSCOPY (EGD) WITH PROPOFOL N/A 06/05/2021   Procedure: ESOPHAGOGASTRODUODENOSCOPY (EGD) WITH PROPOFOL;  Surgeon: Sharyn Creamer, MD;  Location: [place];  Service: Gastroenterology;  Laterality: N/A;   INSERTION OF DIALYSIS CATHETER N/A 08/07/2016   Procedure: INSERTION OF TUNNELED DIALYSIS CATHETER;  Surgeon: Angelia Mould, MD;  Location: [facility];  Service: Vascular;  Laterality: N/A;   LIGATION OF ARTERIOVENOUS  FISTULA Left 09/12/2016   Procedure: BANDING OF LEFT  ARTERIOVENOUS  FISTULA;  Surgeon: Angelia Mould, MD;  Location: [place];  Service: Vascular;  Laterality: Left;   LIGATION OF ARTERIOVENOUS  FISTULA Left 07/17/2021   Procedure: LIGATION OF ARTERIOVENOUS  FISTULA;  Surgeon: Katha Cabal, MD;  Location: [facility];  Service: Vascular;  Laterality: Left;  also include placement of temporary dialysis catheter right or left groin   LOWER EXTREMITY INTERVENTION Right 12/02/2018   Procedure: LOWER EXTREMITY INTERVENTION;  Surgeon: Algernon Huxley, MD;  Location: [place] [facility];  Service: Cardiovascular;  Laterality: Right;   POLYPECTOMY  06/05/2021   Procedure: POLYPECTOMY;  Surgeon: Sharyn Creamer, MD;  Location: [facility];  Service: Gastroenterology;;    TEMPORARY DIALYSIS CATHETER Left 07/17/2021   Procedure: TEMPORARY DIALYSIS CATHETER;  Surgeon: Katha Cabal, MD;  Location: [facility];  Service: Vascular;  Laterality: Left;     Prior to Admission medications   Medication Sig Start Date End Date Taking? Authorizing Provider  albuterol (VENTOLIN HFA) 108 (90 Base) MCG/ACT inhaler Inhale 2 puffs into the lungs every 4 (four) hours as needed for wheezing or shortness of breath. 06/05/21   Danford, Suann Larry, MD  calcium acetate (PHOSLO) 667 MG capsule Take 2 capsules (1,334 mg total) by mouth 3 (three) times daily with meals. 06/05/21   Danford, Suann Larry, MD  cinacalcet (SENSIPAR) 30 MG tablet Take 1 tablet (30 mg total) by mouth daily with breakfast. 06/05/21   Danford, Suann Larry, MD  docusate sodium (COLACE) 100 MG capsule Take 1 capsule (100 mg total) by mouth 2 (two) times daily. 06/05/21   Edwin Dada, MD  epoetin alfa (EPOGEN) 4000 UNIT/ML injection Inject 1 mL (4,000 Units total) into the vein every Monday, Wednesday, and Friday with hemodialysis. 06/22/20   Enzo Bi, MD  ferrous sulfate 325 (65 FE) MG tablet Take 1 tablet (325 mg total) by mouth 2 (two) times daily with a meal. 06/05/21   Danford, Suann Larry, MD  metoprolol tartrate (LOPRESSOR) 50 MG tablet Take 1 tablet (50 mg total) by mouth 2 (two) times daily. 07/04/21   Jennye Boroughs, MD  pantoprazole (PROTONIX) 20 MG tablet Take 1 tablet (20 mg total) by mouth 2 (two) times daily before a meal.  06/05/21 08/04/21  DanfordSuann Larry, MD     Allergies Lisinopril   Family History  Problem Relation Age of Onset   Diabetes Mother    Kidney failure Mother    Healthy Father    Kidney failure Brother    Healthy Sister    Kidney disease Daughter    Post-traumatic stress disorder Neg Hx    Bladder Cancer Neg Hx    Kidney cancer Neg Hx     Social History Social History   Tobacco Use   Smoking status: Every Day    Packs/day: 0.50    Years: 40.00     Pack years: 20.00    Types: Cigarettes   Smokeless tobacco: Never  Vaping Use   Vaping Use: Never used  Substance Use Topics   Alcohol use: Not Currently    Alcohol/week: 21.0 standard drinks    Types: 21 Cans of beer per week    Comment: last drink 6 months ago   Drug use: No    Review of Systems  Constitutional:   No fever or chills.  ENT:   No sore throat. No rhinorrhea. Cardiovascular:   No chest pain or syncope. Respiratory:   Positive shortness of breath without cough. Gastrointestinal:   Negative for abdominal pain, vomiting and diarrhea.  Musculoskeletal:   Negative for focal pain or swelling All other systems reviewed and are negative except as documented above in ROS and HPI.  ____________________________________________   PHYSICAL EXAM:  VITAL SIGNS: ED Triage Vitals  Enc Vitals Group     BP      Pulse      Resp      Temp      Temp src      SpO2      Weight      Height      Head Circumference      Peak Flow      Pain Score      Pain Loc      Pain Edu?      Excl. in [place]?     Vital signs reviewed, nursing assessments reviewed. Pulse ox 99% on 10 L nonrebreather.  Constitutional:   Alert and oriented. Non-toxic appearance. Eyes:   Conjunctivae are normal. EOMI. PERRL. ENT      Head:   Normocephalic and atraumatic.      Nose:   Wearing a mask.      Mouth/Throat:   Wearing a mask.      Neck:   No meningismus. Full ROM. Hematological/Lymphatic/Immunilogical:   No cervical lymphadenopathy. Cardiovascular:   Tachycardia heart rate 110. Symmetric bilateral radial and DP pulses.  No murmurs. Cap refill less than 2 seconds.  Tunneled right IJ dialysis catheter present on the right upper chest. Respiratory:   Tachypnea with a respiratory rate of 24.  Diffuse crackles bilaterally. Gastrointestinal:   Soft and nontender. Non distended. There is no CVA tenderness.  No rebound, rigidity, or guarding. Genitourinary:   deferred Musculoskeletal:   Normal range  of motion in all extremities. No joint effusions.  No lower extremity tenderness.  1+ pitting edema bilateral lower extremities.  There is an open wound from dehisced surgical incision on the left upper arm overlying recently ligated and resected AV fistula.  No purulent drainage or inflammatory changes of the soft tissues.  No crepitus, mass, or fluctuance. Neurologic:   Normal speech and language.  Motor grossly intact. No acute focal neurologic deficits are appreciated.  Skin:    Skin  is warm, dry with left upper extremity wound as above.. No rash noted.  No petechiae, purpura, or bullae.  ____________________________________________    LABS (pertinent positives/negatives) (all labs ordered are listed, but only abnormal results are displayed) Labs Reviewed  COMPREHENSIVE METABOLIC PANEL - Abnormal; Notable for the following components:      Result Value   Sodium 148 (*)    Glucose, Bld 110 (*)    BUN 52 (*)    Creatinine, Ser 9.62 (*)    Albumin 3.0 (*)    GFR, Estimated 6 (*)    All other components within normal limits  CBC WITH DIFFERENTIAL/PLATELET - Abnormal; Notable for the following components:   RBC 2.29 (*)    Hemoglobin 6.9 (*)    HCT 22.9 (*)    RDW 17.6 (*)    Lymphs Abs 0.6 (*)    All other components within normal limits  PROTIME-INR - Abnormal; Notable for the following components:   Prothrombin Time 16.1 (*)    INR 1.3 (*)    All other components within normal limits  RESP PANEL BY RT-PCR (FLU A&B, COVID) [id number]  PROCALCITONIN   ____________________________________________   EKG  Interpreted by me Sinus tachycardia rate 109.  Normal axis.  Prolonged QTC of 520 ms.  Normal QRS ST segments and T waves.  ____________________________________________    RADIOLOGY  No results found.  ____________________________________________   PROCEDURES .Critical Care Performed by: Carrie Mew, MD Authorized by: Carrie Mew, MD   Critical care  provider statement:    Critical care time (minutes):  35   Critical care time was exclusive of:  Separately billable procedures and treating other patients   Critical care was necessary to treat or prevent imminent or life-threatening deterioration of the following conditions:  Respiratory failure, renal failure and cardiac failure   Critical care was time spent personally by me on the following activities:  Development of treatment plan with patient or surrogate, discussions with consultants, evaluation of patient's response to treatment, examination of patient, obtaining history from patient or surrogate, ordering and performing treatments and interventions, ordering and review of laboratory studies, ordering and review of radiographic studies, pulse oximetry, re-evaluation of patient's condition and review of old charts  ____________________________________________  DIFFERENTIAL DIAGNOSIS   Volume overload, pulmonary edema, pneumonia, hyperkalemia/electrolyte derangement  CLINICAL IMPRESSION / ASSESSMENT AND PLAN / ED COURSE  Medications ordered in the ED: Medications - No data to display  Pertinent labs & imaging results that were available during my care of the patient were reviewed by me and considered in my medical decision making (see chart for details).  KATHLEEN LIKINS was evaluated in Emergency Department on 07/30/2021 for the symptoms described in the history of present illness. He was evaluated in the context of the global COVID-19 pandemic, which necessitated consideration that the patient might be at risk for infection with the SARS-CoV-2 virus that causes COVID-19. Institutional protocols and algorithms that pertain to the evaluation of patients at risk for COVID-19 are in a state of rapid change based on information released by regulatory bodies including the CDC and federal and state organizations. These policies and algorithms were followed during the patient's care in the ED.    Patient presents with hypoxia and shortness of breath in the setting of hemodialysis dependent end-stage renal disease.  He has crackles and peripheral edema suggestive of volume overload and pulmonary edema.  Will obtain chest x-ray and labs.  Continue nonrebreather oxygen support to maintain satisfactory oxygenation.  Clinical Course as of 07/30/21 1508  Tue Jul 30, 2021  1504 Chest x-ray consistent with pulmonary edema.  Will admit for oxygen support pending hemodialysis. [PS]    Clinical Course User Index [PS] Carrie Mew, MD     ____________________________________________   FINAL CLINICAL IMPRESSION(S) / ED DIAGNOSES    Final diagnoses:  Acute respiratory failure with hypoxia ([place])  ESRD on hemodialysis [facility])     ED Discharge Orders     None       Portions of this note were generated with dragon dictation software. Dictation errors may occur despite best attempts at proofreading.    Carrie Mew, MD 07/30/21 [phone]

## 2021-07-30 NOTE — H&P (Addendum)
History and Physical   CONSTANTINOS KREMPASKY XNA:[id number] DOB: [birth year] DOA: 07/30/2021  PCP: Hoy Register, MD  Outpatient Specialists: [facility] Patient coming from: Home Via EMS  I have personally briefly reviewed patient's old medical records in [place].  Chief Concern: Shortness of breath  HPI: Jordan [name] is a 55 y.o. male with medical history significant for hypertension, end-stage renal disease on hemodialysis Monday Wednesday Friday, noncompliance with dialysis, combined heart failure, paroxysmal atrial fibrillation, history of COPD, who presents emergency department for chief concerns of shortness of breath.  He denies chest pain.  He reports that he does not know why he missed dialysis on Monday.  He reports he had a full 3-1/2 dialysis session on Saturday.  He endorses dysuria that has been ongoing for about 3 days.  He denies hematuria.  He denies nausea, vomiting, abdominal pain, dysphagia, fever, sick contacts.  He also endorses a cough that is productive of yellow phlegm.  He reports this is new for him.  Social history: He lives at home by himself.  He infrequently smokes cigarettes.  He denies EtOH and recreational drug use.  He is disabled and formerly worked as a Engineer, building services.  Vaccination history: He has received 1 dose of COVID-19 vaccination  ROS: Constitutional: no weight change, no fever ENT/Mouth: no sore throat, no rhinorrhea Eyes: no eye pain, no vision changes Cardiovascular: no chest pain, no dyspnea,  no edema, no palpitations Respiratory: no cough, no sputum, no wheezing Gastrointestinal: no nausea, no vomiting, no diarrhea, no constipation Genitourinary: no urinary incontinence, + dysuria, no hematuria Musculoskeletal: no arthralgias, no myalgias Skin: no skin lesions, no pruritus, Neuro: + weakness, no loss of consciousness, no syncope Psych: no anxiety, no depression, + decrease appetite Heme/Lymph: no bruising, no  bleeding  ED Course: Discussed with emergency medicine provider, patient require hospitalization for chief concerns of volume overload.  He missed his dialysis sessions for unknown reason.  Vitals in the emergency department was remarkable for temperature of 98, respiration rate of 16, initial heart rate of 115, blood pressure 160/117, improved to 113/94, SPO2 is 100% on 10 L nasal cannula.  Labs in the emergency department was remarkable for sodium 148, potassium 4.8, chloride 105, bicarb 28, BUN of 52, serum creatinine of 9.62, nonfasting blood glucose 110, WBC 8.4, hemoglobin 6.9, platelets 185.  COVID/influenza A/influenza B PCR were negative.  ED provider called nephrology, Dr. Holley Raring who will plan for hemodialysis.  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)   Non-compliance with renal dialysis ([place])   Alcohol abuse   SOB (shortness of breath)   CHF (congestive heart failure) (HCC)   Thrombocytopenia (HCC)   Protein-calorie malnutrition, severe   # Acute hypoxic respiratory failure secondary to multifactorial including volume overload and community-acquired pneumonia # Shortness of breath secondary to volume overload presumed secondary to missing hemodialysis session - Nephrology has been consulted we appreciate further interventions - Patient is minimally oliguric currently; one time dose of furosemide 80 mg IV once ordered - Check BNP, procalcitonin - BiPAP nightly ordered - BMP in a.m.  # Cough with new productive yellow phlegm-high suspicion for community-acquired pneumonia - Check procalcitonin, resulted as being elevated - Azithromycin and ceftriaxone 2 g IV to complete 5-day course ordered  # Dysuria-query UTI present on admission, UA  ordered - Ceftriaxone   # End-stage renal disease on  hemodialysis MWF - N.p.o. -Resumed home dosing of Sensipar 30 mg p.o. daily, PhosLo  # History of COPD-appears compensated at this time - Resume albuterol nebulizer every 4 hours as needed for wheezing and shortness of breath  # History of hypertension-takes metoprolol tartrate 50 mg twice daily  # Moderate to severe protein malnutrition-present admission; dietitian has been consulted  # Chronic thrombocytopenia-at baseline  # Chronic anemia secondary to chronic kidney disease  # GERD-PPI  # Urticaria in the right lower extremity that appears to be secondary to bug bites - But bites of unclear origin - Benadryl cream and IV as needed ordered  Chart reviewed.   Complete echo on 07/20/2021: Left ventricular ejection fraction was estimated at 45 to 50%.  The left ventricle has no regional wall motion abnormalities.  Mild concentric left ventricular hypertrophy.  Left ventricular diastolic parameters are consistent with grade 2 diastolic dysfunction.  DVT prophylaxis: Heparin 5000 units subcutaneous every 8 hours Code Status: Full code Diet: N.p.o. Family Communication: No Disposition Plan: Pending clinical course Consults called: Nephrology Admission status: Medsurg-cardiac, observation, telemetry ordered  Past Medical History:  Diagnosis Date   Anemia    Aortic atherosclerosis ([place]) 11/12/2019   ED (erectile dysfunction)    Emphysema of lung ([place]) 11/12/2019   ESRD on hemodialysis ([place])    M/W/F in [place], [place]   ETOH abuse    History of blood transfusion    Hypertension    Wears dentures    Past Surgical History:  Procedure Laterality Date   A/V FISTULAGRAM Left 07/15/2021   Procedure: A/V FISTULAGRAM;  Surgeon: Algernon Huxley, MD;  Location: [facility];  Service: Cardiovascular;  Laterality: Left;   BASCILIC VEIN TRANSPOSITION Left 08/07/2016   Procedure: LEFT BASILIC VEIN TRANSPOSITION;  Surgeon: Angelia Mould, MD;  Location: [place];  Service:  Vascular;  Laterality: Left;   CLOSED REDUCTION NASAL FRACTURE N/A 08/11/2019   Procedure: CLOSED REDUCTION NASAL FRACTURE WITH STABILIZATION;  Surgeon: Irene Limbo, MD;  Location: [place];  Service: Plastics;  Laterality: N/A;   COLONOSCOPY N/A 08/12/2016   Procedure: COLONOSCOPY;  Surgeon: Otis Brace, MD;  Location: [facility];  Service: Gastroenterology;  Laterality: N/A;   COLONOSCOPY WITH PROPOFOL N/A 06/05/2021   Procedure: COLONOSCOPY WITH PROPOFOL;  Surgeon: Sharyn Creamer, MD;  Location: [place];  Service: Gastroenterology;  Laterality: N/A;   DIALYSIS/PERMA CATHETER INSERTION N/A 07/26/2021   Procedure: DIALYSIS/PERMA CATHETER INSERTION;  Surgeon: Algernon Huxley, MD;  Location: [facility];  Service: Cardiovascular;  Laterality: N/A;   ESOPHAGOGASTRODUODENOSCOPY N/A 08/12/2016   Procedure: ESOPHAGOGASTRODUODENOSCOPY (EGD);  Surgeon: Otis Brace, MD;  Location: [place];  Service: Gastroenterology;  Laterality: N/A;   ESOPHAGOGASTRODUODENOSCOPY (EGD) WITH PROPOFOL N/A 06/05/2021   Procedure: ESOPHAGOGASTRODUODENOSCOPY (EGD) WITH PROPOFOL;  Surgeon: Sharyn Creamer, MD;  Location: [place];  Service: Gastroenterology;  Laterality: N/A;   INSERTION OF DIALYSIS CATHETER N/A 08/07/2016   Procedure: INSERTION OF TUNNELED DIALYSIS CATHETER;  Surgeon: Angelia Mould, MD;  Location: [facility];  Service: Vascular;  Laterality: N/A;   LIGATION OF ARTERIOVENOUS  FISTULA Left 09/12/2016   Procedure: BANDING OF LEFT  ARTERIOVENOUS  FISTULA;  Surgeon: Angelia Mould, MD;  Location: [place];  Service: Vascular;  Laterality: Left;   LIGATION OF ARTERIOVENOUS  FISTULA Left 07/17/2021   Procedure: LIGATION OF ARTERIOVENOUS  FISTULA;  Surgeon: Katha Cabal, MD;  Location: [facility];  Service:  Vascular;  Laterality: Left;  also include placement of temporary dialysis catheter right or left groin   LOWER EXTREMITY INTERVENTION Right 12/02/2018   Procedure: LOWER  EXTREMITY INTERVENTION;  Surgeon: Algernon Huxley, MD;  Location: [facility];  Service: Cardiovascular;  Laterality: Right;   POLYPECTOMY  06/05/2021   Procedure: POLYPECTOMY;  Surgeon: Sharyn Creamer, MD;  Location: [facility];  Service: Gastroenterology;;   TEMPORARY DIALYSIS CATHETER Left 07/17/2021   Procedure: TEMPORARY DIALYSIS CATHETER;  Surgeon: Katha Cabal, MD;  Location: [facility];  Service: Vascular;  Laterality: Left;   Social History:  reports that he has been smoking cigarettes. He has a 20.00 pack-year smoking history. He has never used smokeless tobacco. He reports that he does not currently use alcohol after a past usage of about 21.0 standard drinks per week. He reports that he does not use drugs.  Allergies  Allergen Reactions   Lisinopril Swelling   Family History  Problem Relation Age of Onset   Diabetes Mother    Kidney failure Mother    Healthy Father    Kidney failure Brother    Healthy Sister    Kidney disease Daughter    Post-traumatic stress disorder Neg Hx    Bladder Cancer Neg Hx    Kidney cancer Neg Hx    Family history: Family history reviewed and not pertinent  Prior to Admission medications   Medication Sig Start Date End Date Taking? Authorizing Provider  albuterol (VENTOLIN HFA) 108 (90 Base) MCG/ACT inhaler Inhale 2 puffs into the lungs every 4 (four) hours as needed for wheezing or shortness of breath. 06/05/21   Danford, Suann Larry, MD  calcium acetate (PHOSLO) 667 MG capsule Take 2 capsules (1,334 mg total) by mouth 3 (three) times daily with meals. 06/05/21   Danford, Suann Larry, MD  cinacalcet (SENSIPAR) 30 MG tablet Take 1 tablet (30 mg total) by mouth daily with breakfast. 06/05/21   Danford, Suann Larry, MD  docusate sodium (COLACE) 100 MG capsule Take 1 capsule (100 mg total) by mouth 2 (two) times daily. 06/05/21   Edwin Dada, MD  epoetin alfa (EPOGEN) 4000 UNIT/ML injection Inject 1 mL (4,000 Units total)  into the vein every Monday, Wednesday, and Friday with hemodialysis. 06/22/20   Enzo Bi, MD  ferrous sulfate 325 (65 FE) MG tablet Take 1 tablet (325 mg total) by mouth 2 (two) times daily with a meal. 06/05/21   Danford, Suann Larry, MD  metoprolol tartrate (LOPRESSOR) 50 MG tablet Take 1 tablet (50 mg total) by mouth 2 (two) times daily. 07/04/21   Jennye Boroughs, MD  pantoprazole (PROTONIX) 20 MG tablet Take 1 tablet (20 mg total) by mouth 2 (two) times daily before a meal. 06/05/21 08/04/21  Edwin Dada, MD   Physical Exam: Vitals:   07/30/21 1312 07/30/21 1330 07/30/21 1400 07/30/21 1500  BP: (!) 167/118 (!) 176/145 (!) 160/117 (!) 113/94  Pulse:   (!) 112 100  Resp:  (!) 30 (!) 45 (!) 26  Temp:      TempSrc:      SpO2:   98% 98%  Weight:      Height:       Constitutional: appears older than chronological age, frail, cachectic, NAD, calm, comfortable Eyes: PERRL, lids and conjunctivae normal ENMT: Mucous membranes are moist. Posterior pharynx clear of any exudate or lesions. Age-appropriate dentition. Hearing appropriate Neck: normal, supple, no masses, no thyromegaly Respiratory: Diffuse lung sounds, no wheezing, no crackles.  Increased respiratory effort.  Mild to moderate accessory muscle use.  Cardiovascular: Regular rate and rhythm, no murmurs / rubs / gallops.  Bilateral lower extremity 3+ pitting edema. 2+ pedal pulses. No carotid bruits.  Abdomen: Obese abdomen no tenderness, no masses palpated, no hepatosplenomegaly. Bowel sounds positive.  Musculoskeletal: no clubbing / cyanosis. No joint deformity upper and lower extremities. Good ROM, no contractures, no atrophy. Normal muscle tone.  Skin: no rashes, lesions, ulcers. No induration Neurologic: Sensation intact. Strength 5/5 in all 4.  Psychiatric: Normal judgment and insight. Alert and oriented x 3. Normal mood.   EKG: independently reviewed, showing sinus tachycardia with rate of 109, QTc 519  Chest x-ray  on Admission: I personally reviewed and appreciate bilateral pulmonary vascular edema.  No pneumothorax.  No abnormal bone structures appreciated.  Right chest wall dialysis catheter with tip in right atrium.  DG Chest Portable 1 View  Addendum Date: 07/30/2021   ADDENDUM REPORT: 07/30/2021 15:31 ADDENDUM: ADDENDUM PLEASE NOTE INCORRECT DICTATION PRIOR. CORRECT COMPARISON: Chest x-ray 07/23/2021 CORRECT FINDINGS: CORRECT FINDINGS Right chest wall dialysis catheter with tip overlying the right atrium. Cardiomegaly. The heart and mediastinal contours are unchanged. Aortic calcification. No diffuse patchy airspace and interstitial opacities. No pleural effusion. No pneumothorax. No acute osseous abnormality. CORRECT IMPRESSION: Cardiomegaly with pulmonary edema. No pleural effusions. Underlying pericardial effusion is not excluded. Electronically Signed   By: Iven Finn M.D.   On: 07/30/2021 15:31   Result Date: 07/30/2021 CLINICAL DATA:  Chest pain EXAM: PORTABLE CHEST 1 VIEW COMPARISON:  None. FINDINGS: The heart and mediastinal contours are within normal limits. Vascular clips in sternotomy wires overlie the mediastinum. No focal consolidation. No pulmonary edema. No pleural effusion. No pneumothorax. No acute osseous abnormality. IMPRESSION: No active disease. Electronically Signed: By: Iven Finn M.D. On: 07/30/2021 15:25    Labs on Admission: I have personally reviewed following labs CBC: Recent Labs  Lab 07/26/21 0741 07/30/21 1308  WBC 11.6* 8.4  NEUTROABS  --  6.9  HGB 7.7* 6.9*  HCT 25.1* 22.9*  MCV 98.4 100.0  PLT 173 580   Basic Metabolic Panel: Recent Labs  Lab 07/30/21 1308  NA 148*  K 4.8  CL 105  CO2 28  GLUCOSE 110*  BUN 52*  CREATININE 9.62*  CALCIUM 9.1   GFR: Estimated Creatinine Clearance: 8.1 mL/min (A) (by C-G formula based on SCr of 9.62 mg/dL (H)).  Liver Function Tests: Recent Labs  Lab 07/30/21 1308  AST 22  ALT 7  ALKPHOS 112  BILITOT 1.1   PROT 7.3  ALBUMIN 3.0*   Coagulation Profile: Recent Labs  Lab 07/30/21 1308  INR 1.3*   Urine analysis:    Component Value Date/Time   COLORURINE YELLOW (A) 01/13/2021 0153   APPEARANCEUR CLEAR (A) 01/13/2021 0153   LABSPEC 1.012 01/13/2021 0153   PHURINE 9.0 (H) 01/13/2021 0153   GLUCOSEU 150 (A) 01/13/2021 0153   HGBUR NEGATIVE 01/13/2021 0153   BILIRUBINUR NEGATIVE 01/13/2021 0153   KETONESUR NEGATIVE 01/13/2021 0153   PROTEINUR >=300 (A) 01/13/2021 0153   NITRITE NEGATIVE 01/13/2021 0153   LEUKOCYTESUR NEGATIVE 01/13/2021 0153   CRITICAL CARE Performed by: Briant Cedar Kymia Simi  Total critical care time: 35 minutes  Critical care time was exclusive of separately billable procedures and treating other patients.  Critical care was necessary to treat or prevent imminent or life-threatening deterioration.  Critical care was time spent personally by me on the following activities: development of treatment plan with patient and/or surrogate  as well as nursing, discussions with consultants, evaluation of patient's response to treatment, examination of patient, obtaining history from patient or surrogate, ordering and performing treatments and interventions, ordering and review of laboratory studies, ordering and review of radiographic studies, pulse oximetry and re-evaluation of patient's condition.  Dr. Tobie Poet [facility]  If 7PM-7AM, please contact overnight-coverage provider If 7AM-7PM, please contact day coverage provider [email or web address]  07/30/2021, 4:17 PM

## 2021-07-30 NOTE — ED Notes (Signed)
Pt c/o nausea. - Will medicate per MAR.

## 2021-07-30 NOTE — ED Notes (Addendum)
IV RN called and advised she got IV started and left blood at bedside.

## 2021-07-30 NOTE — ED Notes (Addendum)
First contact: Pt requesting to have BM. Able to walk to bathroom & have BM sans complications. Full bed change, noted to continue to have scabies tracks on legs. C/o itching. Changed into gown, given fresh blankets. Will medicate per MAR.   Pt found to have BP cuff on fistula arm - moved to other arm. Fistula wound noted to be dehisced with guaze over opening.    Call light in reach, TV on to channel of choice.

## 2021-07-30 NOTE — ED Triage Notes (Signed)
[facility] for SOB. Top right vhest new  shunt placed Sat/Tuess/thurs missed dialysis yesterday.  Rales upper lobes initial. lower lobes dimisihed.  NRB  CPAP  NTG. 1 spray NTG on chest  110/117 110 BGL 253 initial 78% on NRB

## 2021-07-30 NOTE — ED Notes (Signed)
Pt with one episode of bowel incontinence. Appropriate pericare provided by 2RN's. This RN & RN Joellen Jersey note stage 2 pressure ulcer on coccyx.   Linen change performed. Pt now asleep in semi fowlers position, call light in reach. Denies further needs at this time.

## 2021-07-31 ENCOUNTER — Encounter: Payer: Self-pay | Admitting: Obstetrics and Gynecology

## 2021-07-31 DIAGNOSIS — Z5329 Procedure and treatment not carried out because of patient's decision for other reasons: Secondary | ICD-10-CM | POA: Diagnosis not present

## 2021-07-31 DIAGNOSIS — E8770 Fluid overload, unspecified: Secondary | ICD-10-CM | POA: Diagnosis present

## 2021-07-31 DIAGNOSIS — Z634 Disappearance and death of family member: Secondary | ICD-10-CM | POA: Diagnosis not present

## 2021-07-31 DIAGNOSIS — E43 Unspecified severe protein-calorie malnutrition: Secondary | ICD-10-CM | POA: Diagnosis present

## 2021-07-31 DIAGNOSIS — I5042 Chronic combined systolic (congestive) and diastolic (congestive) heart failure: Secondary | ICD-10-CM | POA: Diagnosis present

## 2021-07-31 DIAGNOSIS — F17210 Nicotine dependence, cigarettes, uncomplicated: Secondary | ICD-10-CM | POA: Diagnosis present

## 2021-07-31 DIAGNOSIS — R7881 Bacteremia: Secondary | ICD-10-CM | POA: Diagnosis present

## 2021-07-31 DIAGNOSIS — J439 Emphysema, unspecified: Secondary | ICD-10-CM | POA: Diagnosis present

## 2021-07-31 DIAGNOSIS — D696 Thrombocytopenia, unspecified: Secondary | ICD-10-CM | POA: Diagnosis present

## 2021-07-31 DIAGNOSIS — N186 End stage renal disease: Secondary | ICD-10-CM | POA: Diagnosis present

## 2021-07-31 DIAGNOSIS — J9601 Acute respiratory failure with hypoxia: Secondary | ICD-10-CM | POA: Diagnosis present

## 2021-07-31 DIAGNOSIS — N2581 Secondary hyperparathyroidism of renal origin: Secondary | ICD-10-CM | POA: Diagnosis present

## 2021-07-31 DIAGNOSIS — I480 Paroxysmal atrial fibrillation: Secondary | ICD-10-CM | POA: Diagnosis present

## 2021-07-31 DIAGNOSIS — K219 Gastro-esophageal reflux disease without esophagitis: Secondary | ICD-10-CM | POA: Diagnosis present

## 2021-07-31 DIAGNOSIS — D631 Anemia in chronic kidney disease: Secondary | ICD-10-CM | POA: Diagnosis present

## 2021-07-31 DIAGNOSIS — F1010 Alcohol abuse, uncomplicated: Secondary | ICD-10-CM | POA: Diagnosis present

## 2021-07-31 DIAGNOSIS — I132 Hypertensive heart and chronic kidney disease with heart failure and with stage 5 chronic kidney disease, or end stage renal disease: Secondary | ICD-10-CM | POA: Diagnosis present

## 2021-07-31 DIAGNOSIS — T827XXA Infection and inflammatory reaction due to other cardiac and vascular devices, implants and grafts, initial encounter: Secondary | ICD-10-CM | POA: Diagnosis present

## 2021-07-31 DIAGNOSIS — T8130XA Disruption of wound, unspecified, initial encounter: Secondary | ICD-10-CM | POA: Diagnosis present

## 2021-07-31 DIAGNOSIS — J189 Pneumonia, unspecified organism: Secondary | ICD-10-CM | POA: Diagnosis present

## 2021-07-31 DIAGNOSIS — Z20822 Contact with and (suspected) exposure to covid-19: Secondary | ICD-10-CM | POA: Diagnosis present

## 2021-07-31 DIAGNOSIS — Y832 Surgical operation with anastomosis, bypass or graft as the cause of abnormal reaction of the patient, or of later complication, without mention of misadventure at the time of the procedure: Secondary | ICD-10-CM | POA: Diagnosis present

## 2021-07-31 DIAGNOSIS — Z992 Dependence on renal dialysis: Secondary | ICD-10-CM | POA: Diagnosis not present

## 2021-07-31 DIAGNOSIS — Z9115 Patient's noncompliance with renal dialysis: Secondary | ICD-10-CM | POA: Diagnosis not present

## 2021-07-31 DIAGNOSIS — R300 Dysuria: Secondary | ICD-10-CM | POA: Diagnosis present

## 2021-07-31 DIAGNOSIS — W57XXXA Bitten or stung by nonvenomous insect and other nonvenomous arthropods, initial encounter: Secondary | ICD-10-CM | POA: Diagnosis present

## 2021-07-31 DIAGNOSIS — E8779 Other fluid overload: Secondary | ICD-10-CM | POA: Diagnosis present

## 2021-07-31 LAB — CBC
HCT: 23.9 % — ABNORMAL LOW (ref 39.0–52.0)
Hemoglobin: 7.1 g/dL — ABNORMAL LOW (ref 13.0–17.0)
MCH: 30.3 pg (ref 26.0–34.0)
MCHC: 29.7 g/dL — ABNORMAL LOW (ref 30.0–36.0)
MCV: 102.1 fL — ABNORMAL HIGH (ref 80.0–100.0)
Platelets: 163 10*3/uL (ref 150–400)
RBC: 2.34 MIL/uL — ABNORMAL LOW (ref 4.22–5.81)
RDW: 17.2 % — ABNORMAL HIGH (ref 11.5–15.5)
WBC: 8.9 10*3/uL (ref 4.0–10.5)
nRBC: 0 % (ref 0.0–0.2)

## 2021-07-31 LAB — BASIC METABOLIC PANEL
Anion gap: 12 (ref 5–15)
BUN: 59 mg/dL — ABNORMAL HIGH (ref 6–20)
CO2: 28 mmol/L (ref 22–32)
Calcium: 9.1 mg/dL (ref 8.9–10.3)
Chloride: 106 mmol/L (ref 98–111)
Creatinine, Ser: 10.08 mg/dL — ABNORMAL HIGH (ref 0.61–1.24)
GFR, Estimated: 6 mL/min — ABNORMAL LOW (ref >60)
Glucose, Bld: 86 mg/dL (ref 70–99)
Potassium: 5.4 mmol/L — ABNORMAL HIGH (ref 3.5–5.1)
Sodium: 146 mmol/L — ABNORMAL HIGH (ref 135–145)

## 2021-07-31 LAB — BRAIN NATRIURETIC PEPTIDE: B Natriuretic Peptide: >4500 pg/mL — ABNORMAL HIGH (ref 0.0–100.0)

## 2021-07-31 MED ORDER — SODIUM CHLORIDE 0.9 % IV SOLN
2.0000 g | INTRAVENOUS | Status: DC
  Administered 2021-07-31: 2 g via INTRAVENOUS
  Filled 2021-07-31 (×2): qty 2

## 2021-07-31 MED ORDER — SODIUM CHLORIDE 0.9 % IV SOLN: 100.0000 mL | INTRAVENOUS | Status: DC | PRN

## 2021-07-31 MED ORDER — CHLORHEXIDINE GLUCONATE CLOTH 2 % EX PADS
6.0000 | MEDICATED_PAD | Freq: Every day | CUTANEOUS | Status: DC
  Filled 2021-07-31: qty 6

## 2021-07-31 MED ORDER — LIDOCAINE HCL (PF) 1 % IJ SOLN
5.0000 mL | INTRAMUSCULAR | Status: DC | PRN
  Filled 2021-07-31: qty 5

## 2021-07-31 MED ORDER — ORAL CARE MOUTH RINSE: 15.0000 mL | Freq: Two times a day (BID) | OROMUCOSAL | Status: DC

## 2021-07-31 MED ORDER — ALTEPLASE 2 MG IJ SOLR
2.0000 mg | Freq: Once | INTRAMUSCULAR | Status: DC | PRN
  Filled 2021-07-31: qty 2

## 2021-07-31 MED ORDER — LIDOCAINE-PRILOCAINE 2.5-2.5 % EX CREA: 1.0000 "application " | TOPICAL_CREAM | CUTANEOUS | Status: DC | PRN

## 2021-07-31 MED ORDER — PENTAFLUOROPROP-TETRAFLUOROETH EX AERO
1.0000 "application " | INHALATION_SPRAY | CUTANEOUS | Status: DC | PRN
  Filled 2021-07-31: qty 30

## 2021-07-31 MED ORDER — HEPARIN SODIUM (PORCINE) 1000 UNIT/ML DIALYSIS
1000.0000 [IU] | INTRAMUSCULAR | Status: DC | PRN
  Filled 2021-07-31: qty 1

## 2021-07-31 MED ORDER — HEPARIN SODIUM (PORCINE) 1000 UNIT/ML IJ SOLN
INTRAMUSCULAR | Status: AC
  Filled 2021-07-31: qty 1

## 2021-07-31 NOTE — Progress Notes (Signed)
[facility] Daily Progress Note  Subjective: Seen and examined during dialysis this afternoon.  She without complaint.  Dialyzing from his PermCath without issue.  Objective: Vitals:   07/31/21 1245 07/31/21 1300 07/31/21 1315 07/31/21 1330  BP: (!) 153/100 124/64 124/90 (P) 101/77  Pulse: 83 82 83 (P) 87  Resp: 20 20 (!) 25 (P) 17  Temp:      TempSrc:      SpO2: 92%  99% (P) 95%  Weight:      Height:        Intake/Output Summary (Last 24 hours) at 07/31/2021 1544 Last data filed at 07/30/2021 2038 Gross per 24 hour  Intake 600 ml  Output --  Net 600 ml   Physical Exam: A&Ox3, NAD CV: RRR Pulmonary: CTA Bilaterally Abdomen: Soft, Nontender, Nondistended Vascular:  Left upper extremity: About a half an inch of dehiscence to the wound on exam.  Mattress sutures are intact can continue to hold the rest of the wound closed.  Fibrinous exudate noted within the wound bed.  Regulation tissue.  Weaning copious amounts of clear fluid.   Laboratory: CBC    Component Value Date/Time   WBC 8.9 07/31/2021 0521   HGB 7.1 (L) 07/31/2021 0521   HCT 23.9 (L) 07/31/2021 0521   PLT 163 07/31/2021 0521   BMET    Component Value Date/Time   NA 146 (H) 07/31/2021 0521   K 5.4 (H) 07/31/2021 0521   CL 106 07/31/2021 0521   CO2 28 07/31/2021 0521   GLUCOSE 86 07/31/2021 0521   BUN 59 (H) 07/31/2021 0521   CREATININE 10.08 (H) 07/31/2021 0521   CALCIUM 9.1 07/31/2021 0521   GFRNONAA 6 (L) 07/31/2021 0521   GFRAA 4 (L) 06/20/2020 0414   Assessment/Planning: The patient is a 55 year old male with multiple chronic issues including known end-stage renal disease currently maintained on hemodialysis  1) End-Stage Renal Disease: Patient presented to the [facility] emergency department with a possible wound infection.  Patient is currently maintained via PermCath on hemodialysis.  Seen and examined during the dialysis seems to be functioning  well.  There has been a dehiscence to the left upper extremity incision.  Sutures are still intact and holding the wound together however it is draining copious amounts of clear fluid.  Wound bed is 100% fibrinous exudate.  In an attempt to optimize the patient's tissue to heal recommend he undergo a wound debridement with VAC placement.  We will plan on this Friday with Dr. Delana Meyer  Discussed with Dr. Eber Hong [facility] PA-C 07/31/2021 3:44 PM

## 2021-07-31 NOTE — ED Notes (Signed)
ED TO INPATIENT HANDOFF REPORT  ED Nurse Name and Phone #: Darnelle Maffucci [phone]  J Name/Age/Gender [name] 55 y.o. male Room/Bed: ED19A/ED19A  Code Status   Code Status: Full Code  Home/SNF/Other Home Patient oriented to: self and place Is this baseline? Yes   Triage Complete: Triage complete  Chief Complaint Volume overload [E87.70]  Triage Note [facility] for SOB. Top right vhest new  shunt placed Sat/Tuess/thurs missed dialysis yesterday.  Rales upper lobes initial. lower lobes dimisihed.  NRB  CPAP  NTG. 1 spray NTG on chest  110/117 110 BGL 253 initial 78% on NRB    Allergies Allergies  Allergen Reactions   Lisinopril Swelling    Level of Care/Admitting Diagnosis ED Disposition     ED Disposition  Admit   Condition  --   Comment  Hospital Area: [facility] [[id number]]  Level of Care: Telemetry Medical [104]  Covid Evaluation: Confirmed COVID Negative  Diagnosis: Volume overload [191478]  Admitting Physician: Eston Esters  Attending Physician: Gwynne Edinger [[id number]]  Estimated length of stay: past midnight tomorrow  Certification:: I certify this patient will need inpatient services for at least 2 midnights          B Medical/Surgery History Past Medical History:  Diagnosis Date   Anemia    Aortic atherosclerosis ([place]) 11/12/2019   ED (erectile dysfunction)    Emphysema of lung ([place]) 11/12/2019   ESRD on hemodialysis ([place])    M/W/F in [place], [place]   ETOH abuse    History of blood transfusion    Hypertension    Wears dentures    Past Surgical History:  Procedure Laterality Date   A/V FISTULAGRAM Left 07/15/2021   Procedure: A/V FISTULAGRAM;  Surgeon: Algernon Huxley, MD;  Location: [facility];  Service: Cardiovascular;  Laterality: Left;   BASCILIC VEIN TRANSPOSITION Left 08/07/2016   Procedure: LEFT BASILIC VEIN TRANSPOSITION;  Surgeon: Angelia Mould, MD;   Location: [facility];  Service: Vascular;  Laterality: Left;   CLOSED REDUCTION NASAL FRACTURE N/A 08/11/2019   Procedure: CLOSED REDUCTION NASAL FRACTURE WITH STABILIZATION;  Surgeon: Irene Limbo, MD;  Location: [facility];  Service: Plastics;  Laterality: N/A;   COLONOSCOPY N/A 08/12/2016   Procedure: COLONOSCOPY;  Surgeon: Otis Brace, MD;  Location: [facility];  Service: Gastroenterology;  Laterality: N/A;   COLONOSCOPY WITH PROPOFOL N/A 06/05/2021   Procedure: COLONOSCOPY WITH PROPOFOL;  Surgeon: Sharyn Creamer, MD;  Location: [place];  Service: Gastroenterology;  Laterality: N/A;   DIALYSIS/PERMA CATHETER INSERTION N/A 07/26/2021   Procedure: DIALYSIS/PERMA CATHETER INSERTION;  Surgeon: Algernon Huxley, MD;  Location: [facility] [place] [facility];  Service: Cardiovascular;  Laterality: N/A;   ESOPHAGOGASTRODUODENOSCOPY N/A 08/12/2016   Procedure: ESOPHAGOGASTRODUODENOSCOPY (EGD);  Surgeon: Otis Brace, MD;  Location: [facility];  Service: Gastroenterology;  Laterality: N/A;   ESOPHAGOGASTRODUODENOSCOPY (EGD) WITH PROPOFOL N/A 06/05/2021   Procedure: ESOPHAGOGASTRODUODENOSCOPY (EGD) WITH PROPOFOL;  Surgeon: Sharyn Creamer, MD;  Location: [facility];  Service: Gastroenterology;  Laterality: N/A;   INSERTION OF DIALYSIS CATHETER N/A 08/07/2016   Procedure: INSERTION OF TUNNELED DIALYSIS CATHETER;  Surgeon: Angelia Mould, MD;  Location: [facility];  Service: Vascular;  Laterality: N/A;   LIGATION OF ARTERIOVENOUS  FISTULA Left 09/12/2016   Procedure: BANDING OF LEFT  ARTERIOVENOUS  FISTULA;  Surgeon: Angelia Mould, MD;  Location: [place];  Service: Vascular;  Laterality: Left;   LIGATION OF ARTERIOVENOUS  FISTULA Left 07/17/2021  Procedure: LIGATION OF ARTERIOVENOUS  FISTULA;  Surgeon: Katha Cabal, MD;  Location: [facility];  Service: Vascular;  Laterality: Left;  also include placement of temporary dialysis catheter right or left groin   LOWER EXTREMITY INTERVENTION Right  12/02/2018   Procedure: LOWER EXTREMITY INTERVENTION;  Surgeon: Algernon Huxley, MD;  Location: [place] [facility];  Service: Cardiovascular;  Laterality: Right;   POLYPECTOMY  06/05/2021   Procedure: POLYPECTOMY;  Surgeon: Sharyn Creamer, MD;  Location: [facility];  Service: Gastroenterology;;   TEMPORARY DIALYSIS CATHETER Left 07/17/2021   Procedure: TEMPORARY DIALYSIS CATHETER;  Surgeon: Katha Cabal, MD;  Location: [facility];  Service: Vascular;  Laterality: Left;     A IV Location/Drains/Wounds Patient Lines/Drains/Airways Status     Active Line/Drains/Airways     Name Placement date Placement time Site Days   Peripheral IV 07/30/21 20 G 2.5" Right;Upper Arm 07/30/21  1351  Arm  1   Hemodialysis Catheter Right Subclavian Double lumen Permanent (Tunneled) 07/26/21  0855  Subclavian  5   Incision (Closed) 07/17/21 Groin Left 07/17/21  1718  -- 14   Incision (Closed) 07/17/21 Arm Left 07/17/21  1718  -- 14   Pressure Injury 07/14/21 Buttocks Mid Stage 2 -  Partial thickness loss of dermis presenting as a shallow open injury with a red, pink wound bed without slough. open area 07/14/21  1402  -- 17            Intake/Output Last 24 hours  Intake/Output Summary (Last 24 hours) at 07/31/2021 1055 Last data filed at 07/30/2021 2038 Gross per 24 hour  Intake 600 ml  Output --  Net 600 ml    Labs/Imaging Results for orders placed or performed during the hospital encounter of 07/30/21 (from the past 48 hour(s))  Comprehensive metabolic panel     Status: Abnormal   Collection Time: 07/30/21  1:08 PM  Result Value Ref Range   Sodium 148 (H) 135 - 145 mmol/L   Potassium 4.8 3.5 - 5.1 mmol/L    Comment: HEMOLYSIS AT THIS LEVEL MAY AFFECT RESULT   Chloride 105 98 - 111 mmol/L   CO2 28 22 - 32 mmol/L   Glucose, Bld 110 (H) 70 - 99 mg/dL    Comment: Glucose reference range applies only to samples taken after fasting for at least 8 hours.   BUN 52 (H) 6 - 20 mg/dL   Creatinine,  Ser 9.62 (H) 0.61 - 1.24 mg/dL   Calcium 9.1 8.9 - 10.3 mg/dL   Total Protein 7.3 6.5 - 8.1 g/dL   Albumin 3.0 (L) 3.5 - 5.0 g/dL   AST 22 15 - 41 U/L   ALT 7 0 - 44 U/L   Alkaline Phosphatase 112 38 - 126 U/L   Total Bilirubin 1.1 0.3 - 1.2 mg/dL   GFR, Estimated 6 (L) >60 mL/min    Comment: (NOTE) Calculated using the CKD-EPI Creatinine Equation (2021)    Anion gap 15 5 - 15    Comment: Performed at [facility], [address] [place], [place] [address]  CBC with Differential     Status: Abnormal   Collection Time: 07/30/21  1:08 PM  Result Value Ref Range   WBC 8.4 4.0 - 10.5 K/uL   RBC 2.29 (L) 4.22 - 5.81 MIL/uL   Hemoglobin 6.9 (L) 13.0 - 17.0 g/dL   HCT 22.9 (L) 39.0 - 52.0 %   MCV 100.0 80.0 - 100.0 fL   MCH 30.1 26.0 -  34.0 pg   MCHC 30.1 30.0 - 36.0 g/dL   RDW 17.6 (H) 11.5 - 15.5 %   Platelets 185 150 - 400 K/uL   nRBC 0.0 0.0 - 0.2 %   Neutrophils Relative % 83 %   Neutro Abs 6.9 1.7 - 7.7 K/uL   Lymphocytes Relative 7 %   Lymphs Abs 0.6 (L) 0.7 - 4.0 K/uL   Monocytes Relative 7 %   Monocytes Absolute 0.6 0.1 - 1.0 K/uL   Eosinophils Relative 1 %   Eosinophils Absolute 0.1 0.0 - 0.5 K/uL   Basophils Relative 1 %   Basophils Absolute 0.1 0.0 - 0.1 K/uL   Immature Granulocytes 1 %   Abs Immature Granulocytes 0.07 0.00 - 0.07 K/uL    Comment: Performed at [facility], [address] [place], [place] [address]  Protime-INR     Status: Abnormal   Collection Time: 07/30/21  1:08 PM  Result Value Ref Range   Prothrombin Time 16.1 (H) 11.4 - 15.2 seconds   INR 1.3 (H) 0.8 - 1.2    Comment: (NOTE) INR goal varies based on device and disease states. Performed at [facility], [address] [place], [place] [address]   Resp Panel by RT-PCR (Flu A&B, Covid) Nasopharyngeal Swab     Status: None   Collection Time: 07/30/21  1:08 PM   Specimen: Nasopharyngeal Swab; Nasopharyngeal(NP) swabs in vial transport medium  Result Value Ref  Range   SARS Coronavirus 2 by RT PCR NEGATIVE NEGATIVE    Comment: (NOTE) SARS-CoV-2 target nucleic acids are NOT DETECTED.  The SARS-CoV-2 RNA is generally detectable in upper respiratory specimens during the acute phase of infection. The lowest concentration of SARS-CoV-2 viral copies this assay can detect is 138 copies/mL. A negative result does not preclude SARS-Cov-2 infection and should not be used as the sole basis for treatment or other patient management decisions. A negative result may occur with  improper specimen collection/handling, submission of specimen other than nasopharyngeal swab, presence of viral mutation(s) within the areas targeted by this assay, and inadequate number of viral copies(<138 copies/mL). A negative result must be combined with clinical observations, patient history, and epidemiological information. The expected result is Negative.  Fact Sheet for Patients:  EntrepreneurPulse.com.au  Fact Sheet for Healthcare Providers:  IncredibleEmployment.be  This test is no t yet approved or cleared by the [place] FDA and  has been authorized for detection and/or diagnosis of SARS-CoV-2 by FDA under an Emergency Use Authorization (EUA). This EUA will remain  in effect (meaning this test can be used) for the duration of the COVID-19 declaration under Section 564(b)(1) of the Act, 21 U.S.C.section 360bbb-3(b)(1), unless the authorization is terminated  or revoked sooner.       Influenza A by PCR NEGATIVE NEGATIVE   Influenza B by PCR NEGATIVE NEGATIVE    Comment: (NOTE) The Xpert Xpress SARS-CoV-2/FLU/RSV plus assay is intended as an aid in the diagnosis of influenza from Nasopharyngeal swab specimens and should not be used as a sole basis for treatment. Nasal washings and aspirates are unacceptable for Xpert Xpress SARS-CoV-2/FLU/RSV testing.  Fact Sheet for  Patients: EntrepreneurPulse.com.au  Fact Sheet for Healthcare Providers: IncredibleEmployment.be  This test is not yet approved or cleared by the [place] FDA and has been authorized for detection and/or diagnosis of SARS-CoV-2 by FDA under an Emergency Use Authorization (EUA). This EUA will remain in effect (meaning this test can be used) for the duration of the COVID-19  declaration under Section 564(b)(1) of the Act, 21 U.S.C. section 360bbb-3(b)(1), unless the authorization is terminated or revoked.  Performed at [facility], [address] [place], [place] [address]   Procalcitonin - Baseline     Status: None   Collection Time: 07/30/21  1:08 PM  Result Value Ref Range   Procalcitonin 2.69 ng/mL    Comment:        Interpretation: PCT > 2 ng/mL: Systemic infection (sepsis) is likely, unless other causes are known. (NOTE)       Sepsis PCT Algorithm           Lower Respiratory Tract                                      Infection PCT Algorithm    ----------------------------     ----------------------------         PCT < 0.25 ng/mL                PCT < 0.10 ng/mL          Strongly encourage             Strongly discourage   discontinuation of antibiotics    initiation of antibiotics    ----------------------------     -----------------------------       PCT 0.25 - 0.50 ng/mL            PCT 0.10 - 0.25 ng/mL               OR       >80% decrease in PCT            Discourage initiation of                                            antibiotics      Encourage discontinuation           of antibiotics    ----------------------------     -----------------------------         PCT >= 0.50 ng/mL              PCT 0.26 - 0.50 ng/mL               AND       <80% decrease in PCT              Encourage initiation of                                             antibiotics       Encourage continuation           of antibiotics     ----------------------------     -----------------------------        PCT >= 0.50 ng/mL                  PCT > 0.50 ng/mL               AND         increase in PCT                  Strongly encourage  initiation of antibiotics    Strongly encourage escalation           of antibiotics                                     -----------------------------                                           PCT <= 0.25 ng/mL                                                 OR                                        > 80% decrease in PCT                                      Discontinue / Do not initiate                                             antibiotics  Performed at [facility], [address] [place], [place] [address]   Basic metabolic panel     Status: Abnormal   Collection Time: 07/31/21  5:21 AM  Result Value Ref Range   Sodium 146 (H) 135 - 145 mmol/L   Potassium 5.4 (H) 3.5 - 5.1 mmol/L   Chloride 106 98 - 111 mmol/L   CO2 28 22 - 32 mmol/L   Glucose, Bld 86 70 - 99 mg/dL    Comment: Glucose reference range applies only to samples taken after fasting for at least 8 hours.   BUN 59 (H) 6 - 20 mg/dL   Creatinine, Ser 10.08 (H) 0.61 - 1.24 mg/dL   Calcium 9.1 8.9 - 10.3 mg/dL   GFR, Estimated 6 (L) >60 mL/min    Comment: (NOTE) Calculated using the CKD-EPI Creatinine Equation (2021)    Anion gap 12 5 - 15    Comment: Performed at [facility], [address] [place], [place] [address]  CBC     Status: Abnormal   Collection Time: 07/31/21  5:21 AM  Result Value Ref Range   WBC 8.9 4.0 - 10.5 K/uL   RBC 2.34 (L) 4.22 - 5.81 MIL/uL   Hemoglobin 7.1 (L) 13.0 - 17.0 g/dL   HCT 23.9 (L) 39.0 - 52.0 %   MCV 102.1 (H) 80.0 - 100.0 fL   MCH 30.3 26.0 - 34.0 pg   MCHC 29.7 (L) 30.0 - 36.0 g/dL   RDW 17.2 (H) 11.5 - 15.5 %   Platelets 163 150 - 400 K/uL   nRBC 0.0 0.0 - 0.2 %    Comment: Performed at [facility], [address], [place], [place] [address]  Brain natriuretic peptide     Status: Abnormal   Collection Time: 07/31/21  5:21 AM  Result Value  Ref Range   B Natriuretic Peptide >4,500.0 (H) 0.0 - 100.0 pg/mL    Comment: Performed at [facility], [address] [place], [place] [address]   DG Chest Portable 1 View  Addendum Date: 07/30/2021   ADDENDUM REPORT: 07/30/2021 15:31 ADDENDUM: ADDENDUM PLEASE NOTE INCORRECT DICTATION PRIOR. CORRECT COMPARISON: Chest x-ray 07/23/2021 CORRECT FINDINGS: CORRECT FINDINGS Right chest wall dialysis catheter with tip overlying the right atrium. Cardiomegaly. The heart and mediastinal contours are unchanged. Aortic calcification. No diffuse patchy airspace and interstitial opacities. No pleural effusion. No pneumothorax. No acute osseous abnormality. CORRECT IMPRESSION: Cardiomegaly with pulmonary edema. No pleural effusions. Underlying pericardial effusion is not excluded. Electronically Signed   By: Iven Finn M.D.   On: 07/30/2021 15:31   Result Date: 07/30/2021 CLINICAL DATA:  Chest pain EXAM: PORTABLE CHEST 1 VIEW COMPARISON:  None. FINDINGS: The heart and mediastinal contours are within normal limits. Vascular clips in sternotomy wires overlie the mediastinum. No focal consolidation. No pulmonary edema. No pleural effusion. No pneumothorax. No acute osseous abnormality. IMPRESSION: No active disease. Electronically Signed: By: Iven Finn M.D. On: 07/30/2021 15:25    Pending Labs Unresulted Labs (From admission, onward)     Start     Ordered   08/01/21 0500  CBC  Daily,   STAT     Question:  Specimen collection method  Answer:  IV Team=IV Team collect   07/31/21 0841   08/01/21 0500  Renal function panel  Daily,   STAT     Question:  Specimen collection method  Answer:  IV Team=IV Team collect   07/31/21 0841   Signed and Held  Hepatitis B surface antigen  (New Admission Hemo Labs (Hepatitis B))  Once,   R       Question:  Specimen  collection method  Answer:  IV Team=IV Team collect   Signed and Held   Signed and Held  Hepatitis B surface antibody  (New Admission [facility] Labs (Hepatitis B))  Once,   R       Question:  Specimen collection method  Answer:  IV Team=IV Team collect   Signed and Held   Signed and Held  Hepatitis B surface antibody,quantitative  (New Admission [facility] Labs (Hepatitis B))  Once,   R       Question:  Specimen collection method  Answer:  IV Team=IV Team collect   Signed and Held            Vitals/Pain Today's Vitals   07/31/21 0931 07/31/21 0946 07/31/21 1043 07/31/21 1044  BP: (!) 128/116  127/78   Pulse: 80 82 79 81  Resp: 15 (!) 27 (!) 24 20  Temp:   97.6 F (36.4 C)   TempSrc:   Oral   SpO2: 100% 100% 94% 100%  Weight:      Height:      PainSc:   0-No pain     Isolation Precautions No active isolations  Medications Medications  ferrous sulfate tablet 325 mg (325 mg Oral Given 07/31/21 0758)  acetaminophen (TYLENOL) tablet 650 mg (has no administration in time range)    Or  acetaminophen (TYLENOL) suppository 650 mg (has no administration in time range)  ondansetron (ZOFRAN) tablet 4 mg ( Oral See Alternative 07/30/21 1952)    Or  ondansetron (ZOFRAN) injection 4 mg (4 mg Intravenous Given 07/30/21 1952)  heparin injection 5,000 Units (5,000 Units Subcutaneous Given 07/31/21 0522)  pantoprazole (PROTONIX) EC tablet 40 mg (40 mg Oral Given 07/31/21  1039)  albuterol (PROVENTIL) (2.5 MG/3ML) 0.083% nebulizer solution 2.5 mg (has no administration in time range)  diphenhydrAMINE-zinc acetate (BENADRYL) 2-0.1 % cream (has no administration in time range)  metoprolol tartrate (LOPRESSOR) tablet 50 mg (0 mg Oral Hold 07/31/21 1040)  cinacalcet (SENSIPAR) tablet 30 mg (30 mg Oral Given 07/31/21 0827)  calcium acetate (PHOSLO) capsule 1,334 mg (1,334 mg Oral Given 07/31/21 0758)  docusate sodium (COLACE) capsule 100 mg (100 mg Oral Given 07/31/21 1039)  Chlorhexidine Gluconate Cloth 2 %  PADS 6 each (has no administration in time range)  ceFEPIme (MAXIPIME) 2 g in sodium chloride 0.9 % 100 mL IVPB (has no administration in time range)  furosemide (LASIX) injection 80 mg (80 mg Intravenous Given 07/30/21 1736)  diphenhydrAMINE (BENADRYL) injection 12.5 mg (12.5 mg Intravenous Given 07/31/21 0151)    Mobility walks Low fall risk      R Recommendations: See Admitting Provider Note  Report given to:   Additional Notes: MWF dialysis, going to dialysis at this time

## 2021-07-31 NOTE — Progress Notes (Signed)
Pt declining telemetry at this time. Pt states he wants to be "left alone". Laurey Arrow, MD notified. Will continue to monitor.

## 2021-07-31 NOTE — Progress Notes (Signed)
Orders for Bipap at night. Patient refuses Bipap/CPAP at this time.

## 2021-07-31 NOTE — Progress Notes (Signed)
Clear drainage noted to LUE at fistula site. Sutures in place. New gauze applied. Pt tolerated well. Will continue to monitor.

## 2021-07-31 NOTE — Progress Notes (Addendum)
Pharmacy Antibiotic Note  Jordan [name] is a 55 y.o. 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  Cultures from the AVF site also grew serratia.  Plan was to give cefepime 2gm IV qHD MWF as outpatient, Patient left AMA but case discussed with nephrology who communicated with HD center orders for cefepime with HD .  He had permacath placed as outpatient on 11/4. Pharmacy has been consulted for cefepime dosing. Patient currently on ceftriaxone and azithromycin.  This admission he presents with fluid overload d/t missed HD on Monday.    Patient received ceftriaxone 2gm IV 11/8 (and azithromycin)  Plan: Plan to give Cefepime 2gm qHD on MWF. 1st dose today after HD  Height: 5\' 4"  (162.6 cm) Weight: 73 kg (161 lb) IBW/kg (Calculated) : 59.2  Temp (24hrs), Avg:97.8 F (36.6 C), Min:97.6 F (36.4 C), Max:98 F (36.7 C)  Recent Labs  Lab 07/26/21 0741 07/30/21 1308 07/31/21 0521  WBC 11.6* 8.4 8.9  CREATININE  --  9.62* 10.08*    Estimated Creatinine Clearance: 7.8 mL/min (A) (by C-G formula based on SCr of 10.08 mg/dL (H)).    Allergies  Allergen Reactions   Lisinopril Swelling     Thank you for allowing pharmacy to be a part of this patient's care.  Doreene Eland, PharmD, BCPS.   Work Cell: [phone] 07/31/2021 10:42 AM

## 2021-07-31 NOTE — Progress Notes (Addendum)
PROGRESS NOTE    WACEY ZIEGER  XTG:[id number] DOB: [birth year] DOA: 07/30/2021 PCP: Hoy Register, MD  Outpatient Specialists: nephrology    Brief Narrative:   From admission h and p Jordan [name] is a 55 y.o. male with medical history significant for hypertension, end-stage renal disease on hemodialysis Monday Wednesday Friday, noncompliance with dialysis, combined heart failure, paroxysmal atrial fibrillation, history of COPD, who presents emergency department for chief concerns of shortness of breath.   He denies chest pain.  He reports that he does not know why he missed dialysis on Monday.  He reports he had a full 3-1/2 dialysis session on Saturday.   He endorses dysuria that has been ongoing for about 3 days.  He denies hematuria.  He denies nausea, vomiting, abdominal pain, dysphagia, fever, sick contacts.  He also endorses a cough that is productive of yellow phlegm.  He reports this is new for him.   Social history: He lives at home by himself.  He infrequently smokes cigarettes.  He denies EtOH and recreational drug use.  He is disabled and formerly worked as a Engineer, building services.     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 No increased WOB but currently requiring NRB. - nephrology consulted, plan for dialysis w/ ultrafiltration today - continue O2, wean as able  # ESRD on mwf hemodialysis - nephrology consulted  # Bacteremia Recent hospitalization where blood cultures  grew serratia, infected left AV graft removed by vascular surgery on 10/26. Left AMA last hospitalization. - continue cefepime w/ dialysis - ID consult tomorrow  # Surgical wound S/p av graft removal by vascular surgery last month. Poor healing - vascular surgery has seen, plan for Friday OR for debridement and wound vac  # HTN - home meds  # Anemia of chronic kidney disease Hgb 7.1 which is stable and at baseline - epo w/ dialysis   # Hx etoh Patient says no recent alcohol or drug use - monitor for withdrawal  # history a fib Here in sinus rhythm. Not anticoagualted presume secondary to bleeding risk or noncompliance - home metoprolol  # COPD Quiescent - home albuterol prn   DVT prophylaxis: heparin Code Status: full Family Communication: none @ bedside  Level of care: Telemetry Cardiac Status is: inpt  The patient will require care spanning > 2 midnights and should be moved to inpatient because: severity of illness       Consultants:  nephrology  Procedures: none  Antimicrobials:  Cefepime ordered    Subjective: This morning says breathing is OK. Has appetite. No chest pain or fevers  Objective: Vitals:   07/31/21 0430 07/31/21 0530 07/31/21 0600 07/31/21 0752  BP: (!) 112/54 (!) 142/95 (!) 147/97 (!) 142/93  Pulse: 87 80 79 78  Resp: (!) 24 16 (!) 25 (!) 26  Temp:    97.6 F (36.4 C)  TempSrc:    Oral  SpO2:  100% 100% 90%  Weight:      Height:  Intake/Output Summary (Last 24 hours) at 07/31/2021 0830 Last data filed at 07/30/2021 2038 Gross per 24 hour  Intake 600 ml  Output --  Net 600 ml   Filed Weights   07/30/21 1302  Weight: 73 kg    Examination:  General exam: Appears calm and comfortable. Chronically ill appearing Respiratory system: Clear to auscultation save for rales at bases Cardiovascular system: S1 & S2 heard, RRR. No JVD, , rubs, gallops or clicks. Mild systolic murmur Gastrointestinal system: Abdomen is  nondistended, soft and nontender. No organomegaly or masses felt. Normal bowel sounds heard. Central nervous system: Alert and oriented. No focal neurological deficits. Extremities: Symmetric 5 x 5 power. Skin: left antecubital fossa sutured incision with serous drainage, subcutaneous tissue visible Psychiatry: calm    Data Reviewed: I have personally reviewed following labs and imaging studies  CBC: Recent Labs  Lab 07/26/21 0741 07/30/21 1308 07/31/21 0521  WBC 11.6* 8.4 8.9  NEUTROABS  --  6.9  --   HGB 7.7* 6.9* 7.1*  HCT 25.1* 22.9* 23.9*  MCV 98.4 100.0 102.1*  PLT 173 185 280   Basic Metabolic Panel: Recent Labs  Lab 07/30/21 1308 07/31/21 0521  NA 148* 146*  K 4.8 5.4*  CL 105 106  CO2 28 28  GLUCOSE 110* 86  BUN 52* 59*  CREATININE 9.62* 10.08*  CALCIUM 9.1 9.1   GFR: Estimated Creatinine Clearance: 7.8 mL/min (A) (by C-G formula based on SCr of 10.08 mg/dL (H)). Liver Function Tests: Recent Labs  Lab 07/30/21 1308  AST 22  ALT 7  ALKPHOS 112  BILITOT 1.1  PROT 7.3  ALBUMIN 3.0*   No results for input(s): LIPASE, AMYLASE in the last 168 hours. No results for input(s): AMMONIA in the last 168 hours. Coagulation Profile: Recent Labs  Lab 07/30/21 1308  INR 1.3*   Cardiac Enzymes: No results for input(s): CKTOTAL, CKMB, CKMBINDEX, TROPONINI in the last 168 hours. BNP (last 3 results) No results for input(s): PROBNP in the last 8760 hours. HbA1C: No results for input(s): HGBA1C in the last 72 hours. CBG: No results for input(s): GLUCAP in the last 168 hours. Lipid Profile: No results for input(s): CHOL, HDL, LDLCALC, TRIG, CHOLHDL, LDLDIRECT in the last 72 hours. Thyroid Function Tests: No results for input(s): TSH, T4TOTAL, FREET4, T3FREE, THYROIDAB in the last 72 hours. Anemia Panel: No results for input(s): VITAMINB12, FOLATE, FERRITIN, TIBC, IRON, RETICCTPCT in the last 72 hours. Urine analysis:    Component Value Date/Time    COLORURINE YELLOW (A) 01/13/2021 0153   APPEARANCEUR CLEAR (A) 01/13/2021 0153   LABSPEC 1.012 01/13/2021 0153   PHURINE 9.0 (H) 01/13/2021 0153   GLUCOSEU 150 (A) 01/13/2021 0153   HGBUR NEGATIVE 01/13/2021 0153   BILIRUBINUR NEGATIVE 01/13/2021 0153   KETONESUR NEGATIVE 01/13/2021 0153   PROTEINUR >=300 (A) 01/13/2021 0153   NITRITE NEGATIVE 01/13/2021 0153   LEUKOCYTESUR NEGATIVE 01/13/2021 0153   Sepsis Labs: @LABRCNTIP (procalcitonin:4,lacticidven:4)  ) Recent Results (from the past 240 hour(s))  Resp Panel by RT-PCR (Flu A&B, Covid) Nasopharyngeal Swab     Status: None   Collection Time: 07/23/21  6:00 AM   Specimen: Nasopharyngeal Swab; Nasopharyngeal(NP) swabs in vial transport medium  Result Value Ref Range Status   SARS Coronavirus 2 by RT PCR NEGATIVE NEGATIVE Final    Comment: (NOTE) SARS-CoV-2 target nucleic acids are NOT DETECTED.  The SARS-CoV-2 RNA is generally detectable in upper respiratory specimens during the acute phase of infection. The lowest concentration of SARS-CoV-2 viral  copies this assay can detect is 138 copies/mL. A negative result does not preclude SARS-Cov-2 infection and should not be used as the sole basis for treatment or other patient management decisions. A negative result may occur with  improper specimen collection/handling, submission of specimen other than nasopharyngeal swab, presence of viral mutation(s) within the areas targeted by this assay, and inadequate number of viral copies(<138 copies/mL). A negative result must be combined with clinical observations, patient history, and epidemiological information. The expected result is Negative.  Fact Sheet for Patients:  EntrepreneurPulse.com.au  Fact Sheet for Healthcare Providers:  IncredibleEmployment.be  This test is no t yet approved or cleared by the [place] FDA and  has been authorized for detection and/or diagnosis of SARS-CoV-2  by FDA under an Emergency Use Authorization (EUA). This EUA will remain  in effect (meaning this test can be used) for the duration of the COVID-19 declaration under Section 564(b)(1) of the Act, 21 U.S.C.section 360bbb-3(b)(1), unless the authorization is terminated  or revoked sooner.       Influenza A by PCR NEGATIVE NEGATIVE Final   Influenza B by PCR NEGATIVE NEGATIVE Final    Comment: (NOTE) The Xpert Xpress SARS-CoV-2/FLU/RSV plus assay is intended as an aid in the diagnosis of influenza from Nasopharyngeal swab specimens and should not be used as a sole basis for treatment. Nasal washings and aspirates are unacceptable for Xpert Xpress SARS-CoV-2/FLU/RSV testing.  Fact Sheet for Patients: EntrepreneurPulse.com.au  Fact Sheet for Healthcare Providers: IncredibleEmployment.be  This test is not yet approved or cleared by the [place] FDA and has been authorized for detection and/or diagnosis of SARS-CoV-2 by FDA under an Emergency Use Authorization (EUA). This EUA will remain in effect (meaning this test can be used) for the duration of the COVID-19 declaration under Section 564(b)(1) of the Act, 21 U.S.C. section 360bbb-3(b)(1), unless the authorization is terminated or revoked.  Performed at [facility], [address] [place],  [address]   Resp Panel by RT-PCR (Flu A&B, Covid) Nasopharyngeal Swab     Status: None   Collection Time: 07/30/21  1:08 PM   Specimen: Nasopharyngeal Swab; Nasopharyngeal(NP) swabs in vial transport medium  Result Value Ref Range Status   SARS Coronavirus 2 by RT PCR NEGATIVE NEGATIVE Final    Comment: (NOTE) SARS-CoV-2 target nucleic acids are NOT DETECTED.  The SARS-CoV-2 RNA is generally detectable in upper respiratory specimens during the acute phase of infection. The lowest concentration of SARS-CoV-2 viral copies this assay can detect is 138 copies/mL. A negative result does  not preclude SARS-Cov-2 infection and should not be used as the sole basis for treatment or other patient management decisions. A negative result may occur with  improper specimen collection/handling, submission of specimen other than nasopharyngeal swab, presence of viral mutation(s) within the areas targeted by this assay, and inadequate number of viral copies(<138 copies/mL). A negative result must be combined with clinical observations, patient history, and epidemiological information. The expected result is Negative.  Fact Sheet for Patients:  EntrepreneurPulse.com.au  Fact Sheet for Healthcare Providers:  IncredibleEmployment.be  This test is no t yet approved or cleared by the [place] FDA and  has been authorized for detection and/or diagnosis of SARS-CoV-2 by FDA under an Emergency Use Authorization (EUA). This EUA will remain  in effect (meaning this test can be used) for the duration of the COVID-19 declaration under Section 564(b)(1) of the Act, 21 U.S.C.section 360bbb-3(b)(1), unless the authorization is terminated  or revoked sooner.  Influenza A by PCR NEGATIVE NEGATIVE Final   Influenza B by PCR NEGATIVE NEGATIVE Final    Comment: (NOTE) The Xpert Xpress SARS-CoV-2/FLU/RSV plus assay is intended as an aid in the diagnosis of influenza from Nasopharyngeal swab specimens and should not be used as a sole basis for treatment. Nasal washings and aspirates are unacceptable for Xpert Xpress SARS-CoV-2/FLU/RSV testing.  Fact Sheet for Patients: EntrepreneurPulse.com.au  Fact Sheet for Healthcare Providers: IncredibleEmployment.be  This test is not yet approved or cleared by the [place] FDA and has been authorized for detection and/or diagnosis of SARS-CoV-2 by FDA under an Emergency Use Authorization (EUA). This EUA will remain in effect (meaning this test can be used) for the  duration of the COVID-19 declaration under Section 564(b)(1) of the Act, 21 U.S.C. section 360bbb-3(b)(1), unless the authorization is terminated or revoked.  Performed at [facility], [address] [place], [place] [address]          Radiology Studies: DG Chest Portable 1 View  Addendum Date: 07/30/2021   ADDENDUM REPORT: 07/30/2021 15:31 ADDENDUM: ADDENDUM PLEASE NOTE INCORRECT DICTATION PRIOR. CORRECT COMPARISON: Chest x-ray 07/23/2021 CORRECT FINDINGS: CORRECT FINDINGS Right chest wall dialysis catheter with tip overlying the right atrium. Cardiomegaly. The heart and mediastinal contours are unchanged. Aortic calcification. No diffuse patchy airspace and interstitial opacities. No pleural effusion. No pneumothorax. No acute osseous abnormality. CORRECT IMPRESSION: Cardiomegaly with pulmonary edema. No pleural effusions. Underlying pericardial effusion is not excluded. Electronically Signed   By: Iven Finn M.D.   On: 07/30/2021 15:31   Result Date: 07/30/2021 CLINICAL DATA:  Chest pain EXAM: PORTABLE CHEST 1 VIEW COMPARISON:  None. FINDINGS: The heart and mediastinal contours are within normal limits. Vascular clips in sternotomy wires overlie the mediastinum. No focal consolidation. No pulmonary edema. No pleural effusion. No pneumothorax. No acute osseous abnormality. IMPRESSION: No active disease. Electronically Signed: By: Iven Finn M.D. On: 07/30/2021 15:25        Scheduled Meds:  calcium acetate  1,334 mg Oral TID WC   cinacalcet  30 mg Oral Q breakfast   docusate sodium  100 mg Oral BID   ferrous sulfate  325 mg Oral BID WC   heparin  5,000 Units Subcutaneous Q8H   metoprolol tartrate  50 mg Oral BID   pantoprazole  40 mg Oral Daily   Continuous Infusions:  azithromycin Stopped (07/30/21 2038)   cefTRIAXone (ROCEPHIN)  IV Stopped (07/30/21 1842)     LOS: 0 days    Time spent: 40 min    Desma Maxim, MD [facility]   If  7PM-7AM, please contact night-coverage [email or web address] Password [email or web address] 07/31/2021, 8:30 AM

## 2021-07-31 NOTE — Progress Notes (Signed)
Pt arrived to room 228 via stretcher from dialysis. Received report from [place], [place]. See assessment. Will continue to monitor.

## 2021-07-31 NOTE — ED Notes (Signed)
Gave patient food tray with drink.

## 2021-07-31 NOTE — ED Notes (Signed)
Pt c/o itching - medicated per [facility]

## 2021-07-31 NOTE — Progress Notes (Signed)
[facility]  ROUNDING NOTE   Subjective:    [facility]  ROUNDING NOTE   Subjective:   Jordan [name] is a 55 year old male with a past medical history of alcohol abuse, anemia, via diastolic chronic heart failure, ACS, COPD, hypertension, end-stage renal disease on hemodialysis.  He presents to the emergency room with complaints of shortness of breath.  He has been admitted for Volume overload [E87.70]  Patient is known to our clinic from previous hospitalizations.  He currently receives outpatient treatments at [facility] supervised by [facility] physicians.  He has a history of multiple missed treatments at outpatient clinic.  He reports his last dialysis treatment was completed during his last hospitalization which appears to be on 07/22/2021.  He was treated during that hospitalization with a line free holiday due to a blood infection.  He returned on July 26, 2021 to have PermCath placed and was released to return to outpatient clinic for dialysis treatments.  Patient arrived to ED was placed on oxygen and given one-time dose of furosemide 80 mg IV.  Scheduled for dialysis today.  Patient seen and evaluated during dialysis treatment   HEMODIALYSIS FLOWSHEET:  Blood Flow Rate (mL/min): (P) 400 mL/min Arterial Pressure (mmHg): (P) -130 mmHg Venous Pressure (mmHg): (P) 120 mmHg Transmembrane Pressure (mmHg): (P) 80 mmHg Ultrafiltration Rate (mL/min): (P) 1170 mL/min Dialysate Flow Rate (mL/min): (P) 500 ml/min Conductivity: Machine : (P) 13.8 Conductivity: Machine : (P) 13.8    Objective:  Vital signs in last 24 hours:  Temp:  [97.6 F (36.4 C)] 97.6 F (36.4 C) (11/09 1100) Pulse Rate:  [72-139] (P) 87 (11/09 1330) Resp:  [9-29] (P) 17 (11/09 1330) BP: (100-170)/(54-135) (P) 101/77 (11/09 1330) SpO2:  [90 %-100 %] (P) 95 % (11/09 1330)  Weight change:  Filed Weights   07/30/21 1302  Weight: 73 kg    Intake/Output: I/O last 3  completed shifts: In: 600 [P.O.:350; IV Piggyback:250] Out: -    Intake/Output this shift:  No intake/output data recorded.  Physical Exam: General: NAD, resting quietly in bed  Head: Normocephalic, atraumatic. Moist oral mucosal membranes  Eyes: Anicteric  Lungs:  Basilar Rales, O2   Heart: Regular rate and rhythm  Abdomen:  Soft, nontender, nondistended  Extremities: No peripheral edema.  Neurologic: Nonfocal, moving all four extremities  Skin: Left upper arm wound  Access: Right IJ PermCath    Basic Metabolic Panel: Recent Labs  Lab 07/30/21 1308 07/31/21 0521  NA 148* 146*  K 4.8 5.4*  CL 105 106  CO2 28 28  GLUCOSE 110* 86  BUN 52* 59*  CREATININE 9.62* 10.08*  CALCIUM 9.1 9.1    Liver Function Tests: Recent Labs  Lab 07/30/21 1308  AST 22  ALT 7  ALKPHOS 112  BILITOT 1.1  PROT 7.3  ALBUMIN 3.0*   No results for input(s): LIPASE, AMYLASE in the last 168 hours. No results for input(s): AMMONIA in the last 168 hours.  CBC: Recent Labs  Lab 07/26/21 0741 07/30/21 1308 07/31/21 0521  WBC 11.6* 8.4 8.9  NEUTROABS  --  6.9  --   HGB 7.7* 6.9* 7.1*  HCT 25.1* 22.9* 23.9*  MCV 98.4 100.0 102.1*  PLT 173 185 163    Cardiac Enzymes: No results for input(s): CKTOTAL, CKMB, CKMBINDEX, TROPONINI in the last 168 hours.  BNP: Invalid input(s): POCBNP  CBG: No results for input(s): GLUCAP in the last 168 hours.  Microbiology: Results for orders placed or performed during  the hospital encounter of 07/30/21  Resp Panel by RT-PCR (Flu A&B, Covid) Nasopharyngeal Swab     Status: None   Collection Time: 07/30/21  1:08 PM   Specimen: Nasopharyngeal Swab; Nasopharyngeal(NP) swabs in vial transport medium  Result Value Ref Range Status   SARS Coronavirus 2 by RT PCR NEGATIVE NEGATIVE Final    Comment: (NOTE) SARS-CoV-2 target nucleic acids are NOT DETECTED.  The SARS-CoV-2 RNA is generally detectable in upper respiratory specimens during the acute  phase of infection. The lowest concentration of SARS-CoV-2 viral copies this assay can detect is 138 copies/mL. A negative result does not preclude SARS-Cov-2 infection and should not be used as the sole basis for treatment or other patient management decisions. A negative result may occur with  improper specimen collection/handling, submission of specimen other than nasopharyngeal swab, presence of viral mutation(s) within the areas targeted by this assay, and inadequate number of viral copies(<138 copies/mL). A negative result must be combined with clinical observations, patient history, and epidemiological information. The expected result is Negative.  Fact Sheet for Patients:  EntrepreneurPulse.com.au  Fact Sheet for Healthcare Providers:  IncredibleEmployment.be  This test is no t yet approved or cleared by the [place] FDA and  has been authorized for detection and/or diagnosis of SARS-CoV-2 by FDA under an Emergency Use Authorization (EUA). This EUA will remain  in effect (meaning this test can be used) for the duration of the COVID-19 declaration under Section 564(b)(1) of the Act, 21 U.S.C.section 360bbb-3(b)(1), unless the authorization is terminated  or revoked sooner.       Influenza A by PCR NEGATIVE NEGATIVE Final   Influenza B by PCR NEGATIVE NEGATIVE Final    Comment: (NOTE) The Xpert Xpress SARS-CoV-2/FLU/RSV plus assay is intended as an aid in the diagnosis of influenza from Nasopharyngeal swab specimens and should not be used as a sole basis for treatment. Nasal washings and aspirates are unacceptable for Xpert Xpress SARS-CoV-2/FLU/RSV testing.  Fact Sheet for Patients: EntrepreneurPulse.com.au  Fact Sheet for Healthcare Providers: IncredibleEmployment.be  This test is not yet approved or cleared by the [place] FDA and has been authorized for detection and/or diagnosis of  SARS-CoV-2 by FDA under an Emergency Use Authorization (EUA). This EUA will remain in effect (meaning this test can be used) for the duration of the COVID-19 declaration under Section 564(b)(1) of the Act, 21 U.S.C. section 360bbb-3(b)(1), unless the authorization is terminated or revoked.  Performed at [facility], [address] [place], [place] [address]     Coagulation Studies: Recent Labs    07/30/21 1308  LABPROT 16.1*  INR 1.3*    Urinalysis: No results for input(s): COLORURINE, LABSPEC, PHURINE, GLUCOSEU, HGBUR, BILIRUBINUR, KETONESUR, PROTEINUR, UROBILINOGEN, NITRITE, LEUKOCYTESUR in the last 72 hours.  Invalid input(s): APPERANCEUR    Imaging: DG Chest Portable 1 View  Addendum Date: 07/30/2021   ADDENDUM REPORT: 07/30/2021 15:31 ADDENDUM: ADDENDUM PLEASE NOTE INCORRECT DICTATION PRIOR. CORRECT COMPARISON: Chest x-ray 07/23/2021 CORRECT FINDINGS: CORRECT FINDINGS Right chest wall dialysis catheter with tip overlying the right atrium. Cardiomegaly. The heart and mediastinal contours are unchanged. Aortic calcification. No diffuse patchy airspace and interstitial opacities. No pleural effusion. No pneumothorax. No acute osseous abnormality. CORRECT IMPRESSION: Cardiomegaly with pulmonary edema. No pleural effusions. Underlying pericardial effusion is not excluded. Electronically Signed   By: Iven Finn M.D.   On: 07/30/2021 15:31   Result Date: 07/30/2021 CLINICAL DATA:  Chest pain EXAM: PORTABLE CHEST 1 VIEW COMPARISON:  None. FINDINGS: The heart and  mediastinal contours are within normal limits. Vascular clips in sternotomy wires overlie the mediastinum. No focal consolidation. No pulmonary edema. No pleural effusion. No pneumothorax. No acute osseous abnormality. IMPRESSION: No active disease. Electronically Signed: By: Iven Finn M.D. On: 07/30/2021 15:25     Medications:    sodium chloride     sodium chloride     ceFEPime (MAXIPIME) IV       calcium acetate  1,334 mg Oral TID WC   Chlorhexidine Gluconate Cloth  6 each Topical Q0600   cinacalcet  30 mg Oral Q breakfast   docusate sodium  100 mg Oral BID   ferrous sulfate  325 mg Oral BID WC   heparin  5,000 Units Subcutaneous Q8H   metoprolol tartrate  50 mg Oral BID   pantoprazole  40 mg Oral Daily   sodium chloride, sodium chloride, acetaminophen **OR** acetaminophen, albuterol, alteplase, diphenhydrAMINE-zinc acetate, heparin, lidocaine (PF), lidocaine-prilocaine, ondansetron **OR** ondansetron (ZOFRAN) IV, pentafluoroprop-tetrafluoroeth  Assessment/ Plan:  Jordan [name] is a 55 y.o.  male with a past medical history of alcohol abuse, anemia, diastolic chronic heart failure, ACS, COPD, hypertension, end-stage renal disease on hemodialysis.  He presents to the emergency room with complaints of shortness of breath.  He has been admitted for Volume overload [E87.70]  [facility] [address]/MWF/Lt AVF/67kg  End-stage renal disease with fluid overload on hemodialysis.  Will maintain outpatient schedule if possible.  Last dialysis treatment completed on 07/22/2021, during hospitalization.  Patient complains of shortness of breath.  Dialysis treatment received and completed.  UF goal 3 L achieved.  Would benefit from additional treatment tomorrow.    2. Anemia of chronic kidney disease Lab Results  Component Value Date   HGB 7.1 (L) 07/31/2021  Hemoglobin suboptimal at this time.  We will continue EPO 10,000 units IV with dialysis treatments.  3. Secondary Hyperparathyroidism: Lab Results  Component Value Date   PTH 669 (H) 02/20/2020   CALCIUM 9.1 07/31/2021   CAION 1.12 (L) 06/02/2021   PHOS 3.0 07/23/2021  Calcium at goal.  We will obtain updated phosphorus in a.m.  Continue calcium acetate with meals  4.  Chronic systolic heart failure.  Echo from 07/20/2021 indicates EF 45 to 50%.   LOS: 0   11/9/20223:10 PM

## 2021-07-31 NOTE — ED Notes (Signed)
Pt repositioned in bed. Clean chux placed under pt. Call light within reach. Pt has no further needs at this time.

## 2021-07-31 NOTE — ED Notes (Signed)
Pt transported to dialysis on monitor, pt satting well on 6L via [place] at this time.

## 2021-07-31 NOTE — ED Notes (Addendum)
Pt in bed, pt on 6L via [place], pt satting 90%, replaced NRB, pt arousable to verbal stim, pt oriented to self, and place re oriented pt

## 2021-07-31 NOTE — Plan of Care (Signed)
  Problem: Education: Goal: Knowledge of General Education information will improve Description: Including pain rating scale, medication(s)/side effects and non-pharmacologic comfort measures Outcome: Progressing   Problem: Health Behavior/Discharge Planning: Goal: Ability to manage health-related needs will improve Outcome: Progressing   Problem: Clinical Measurements: Goal: Ability to maintain clinical measurements within normal limits will improve Outcome: Progressing Goal: Respiratory complications will improve Outcome: Progressing   Problem: Nutrition: Goal: Adequate nutrition will be maintained Outcome: Progressing   Problem: Pain Managment: Goal: General experience of comfort will improve Outcome: Progressing   Problem: Safety: Goal: Ability to remain free from injury will improve Outcome: Progressing   Problem: Skin Integrity: Goal: Risk for impaired skin integrity will decrease Outcome: Progressing

## 2021-07-31 NOTE — Progress Notes (Signed)
Pt tolerated HD session. Met targeted UF, removed 3 liters. CVC to right chest functioned without difficulty, maintained prescribed BFR. Pt. calm and cooperative throughout session.

## 2021-08-01 ENCOUNTER — Other Ambulatory Visit (INDEPENDENT_AMBULATORY_CARE_PROVIDER_SITE_OTHER): Payer: Self-pay | Admitting: Vascular Surgery

## 2021-08-01 DIAGNOSIS — T8131XA Disruption of external operation (surgical) wound, not elsewhere classified, initial encounter: Secondary | ICD-10-CM

## 2021-08-01 DIAGNOSIS — Y832 Surgical operation with anastomosis, bypass or graft as the cause of abnormal reaction of the patient, or of later complication, without mention of misadventure at the time of the procedure: Secondary | ICD-10-CM

## 2021-08-01 DIAGNOSIS — Z720 Tobacco use: Secondary | ICD-10-CM

## 2021-08-01 LAB — RENAL FUNCTION PANEL
Albumin: 2.8 g/dL — ABNORMAL LOW (ref 3.5–5.0)
Anion gap: 11 (ref 5–15)
BUN: 36 mg/dL — ABNORMAL HIGH (ref 6–20)
CO2: 28 mmol/L (ref 22–32)
Calcium: 8.3 mg/dL — ABNORMAL LOW (ref 8.9–10.3)
Chloride: 102 mmol/L (ref 98–111)
Creatinine, Ser: 6.95 mg/dL — ABNORMAL HIGH (ref 0.61–1.24)
GFR, Estimated: 9 mL/min — ABNORMAL LOW (ref >60)
Glucose, Bld: 92 mg/dL (ref 70–99)
Phosphorus: 5.5 mg/dL — ABNORMAL HIGH (ref 2.5–4.6)
Potassium: 4.8 mmol/L (ref 3.5–5.1)
Sodium: 141 mmol/L (ref 135–145)

## 2021-08-01 LAB — CBC
HCT: 23.1 % — ABNORMAL LOW (ref 39.0–52.0)
Hemoglobin: 6.9 g/dL — ABNORMAL LOW (ref 13.0–17.0)
MCH: 30.3 pg (ref 26.0–34.0)
MCHC: 29.9 g/dL — ABNORMAL LOW (ref 30.0–36.0)
MCV: 101.3 fL — ABNORMAL HIGH (ref 80.0–100.0)
Platelets: 164 10*3/uL (ref 150–400)
RBC: 2.28 MIL/uL — ABNORMAL LOW (ref 4.22–5.81)
RDW: 16.6 % — ABNORMAL HIGH (ref 11.5–15.5)
WBC: 7.5 10*3/uL (ref 4.0–10.5)
nRBC: 0 % (ref 0.0–0.2)

## 2021-08-01 LAB — HEPATITIS B SURFACE ANTIBODY,QUALITATIVE: Hep B S Ab: REACTIVE — AB

## 2021-08-01 LAB — HEPATITIS B SURFACE ANTIGEN: Hepatitis B Surface Ag: NONREACTIVE

## 2021-08-01 NOTE — Progress Notes (Signed)
**Jordan Jordan via Obfuscation** Jordan Jordan  Subjective: Patient without complaint this AM.  No acute issues overnight.  Tolerated dialysis yesterday without issue.  Objective: Vitals:   07/31/21 1608 07/31/21 1922 08/01/21 0321 08/01/21 0715  BP: (!) 130/91 (!) 105/93 101/65 108/84  Pulse: 73 73 (!) 59 62  Resp: 18 16 18 18   Temp: 98.3 F (36.8 C)   (!) 97.4 F (36.3 C)  TempSrc: Axillary   Oral  SpO2: 100% 100% 96% 100%  Weight: 66.9 kg     Height: 5\' 4"  (1.626 m)       Intake/Output Summary (Last 24 hours) at 08/01/2021 1057 Last data filed at 08/01/2021 1024 Gross per 24 hour  Intake 340 ml  Output 3007 ml  Net -2667 ml   Physical Exam: A&Ox3, NAD CV: RRR Pulmonary: CTA Bilaterally Abdomen: Soft, Nontender, Nondistended Vascular:             Left upper extremity: About a half an inch of dehiscence to the wound on exam.  Mattress sutures are intact can continue to hold the rest of the wound closed.  Fibrinous exudate noted within the wound bed.  Regulation tissue.  Weaning copious amounts of clear fluid.   Laboratory: CBC    Component Value Date/Time   WBC 7.5 08/01/2021 0510   HGB 6.9 (L) 08/01/2021 0510   HCT 23.1 (L) 08/01/2021 0510   PLT 164 08/01/2021 0510   BMET    Component Value Date/Time   NA 141 08/01/2021 0510   K 4.8 08/01/2021 0510   CL 102 08/01/2021 0510   CO2 28 08/01/2021 0510   GLUCOSE 92 08/01/2021 0510   BUN 36 (H) 08/01/2021 0510   CREATININE 6.95 (H) 08/01/2021 0510   CALCIUM 8.3 (L) 08/01/2021 0510   GFRNONAA 9 (L) 08/01/2021 0510   GFRAA 4 (L) 06/20/2020 0414   Assessment/Planning: The patient is a 55 year old male with known history of end-stage renal disease who presents with left upper extremity wound s/p resection of graft  1) Left upper extremity wound dehiscence / infection: Patient with known history of end-stage renal disease currently maintained via a PermCath on hemodialysis. Patient is status post a graft  excision to the left upper extremity due to infection. Patient presents with wound dehiscence and possible infection and therefore 3 recommend undergoing a wound debridement with possible VAC placement. Procedure, risks and benefits were explained to the patient.  All questions were answered.  The patient wishes to proceed.  We will plan on this tomorrow AM with Dr. Delana Jordan.  2) End-stage renal disease: Patient is currently maintained via a right IJ PermCath. Patient seen and examined yesterday on dialysis and seems that his PermCath is functioning appropriately.  3) Tobacco abuse: We had a discussion for approximately three minutes regarding the absolute need for smoking cessation due to the deleterious nature of tobacco on the vascular system. We discussed the tobacco use would diminish patency of any intervention, and likely significantly worsen progressio of disease. The patient voices their understanding of the importance of smoking cessation.   Discussed with Dr. Eber Hong Sumayah Bearse PA-C 08/01/2021 10:57 AM

## 2021-08-01 NOTE — Progress Notes (Signed)
Attempted several times to administer medications. Patient refused and yelled "Leave me alone".

## 2021-08-01 NOTE — Progress Notes (Signed)
Patient stated that he is leaving the hospital. I Informed him that he does not have a discharge order. He verbalized understanding that he does not have a discharge order and would leave against medical advise. Patient has signed AMA paper and waiting for his brother to pick him up. MD made aware via secure chat.

## 2021-08-01 NOTE — Progress Notes (Signed)
[name]  ROUNDING NOTE   Subjective:    [facility]  ROUNDING NOTE   Subjective:   Jordan [name] is a 55 year old male with a past medical history of alcohol abuse, anemia, via diastolic chronic heart failure, ACS, COPD, hypertension, end-stage renal disease on hemodialysis.  He presents to the emergency room with complaints of shortness of breath.  He has been admitted for Volume overload [E87.70] Acute respiratory failure with hypoxia ([place]) [J96.01] ESRD on hemodialysis ([place]) [N18.6, Z99.2]  Patient is known to our clinic from previous hospitalizations.  He currently receives outpatient treatments at [facility] supervised by [facility] physicians.    Patient seen resting in bed this morning, alert and oriented States he feels better this morning and has some stuff he needs to take care of at home.  Denies shortness of breath and refusing additional treatment dialysis treatment today  Objective:  Vital signs in last 24 hours:  Temp:  [97.4 F (36.3 C)-98.3 F (36.8 C)] 97.4 F (36.3 C) (11/10 0715) Pulse Rate:  [59-87] 62 (11/10 0715) Resp:  [16-18] 18 (11/10 0715) BP: (101-131)/(65-93) 108/84 (11/10 0715) SpO2:  [95 %-100 %] 100 % (11/10 0715) Weight:  [66.9 kg] 66.9 kg (11/09 1608)  Weight change: -6.129 kg Filed Weights   07/30/21 1302 07/31/21 1608  Weight: 73 kg 66.9 kg    Intake/Output: I/O last 3 completed shifts: In: 590 [P.O.:240; IV Piggyback:350] Out: 3007 [NUUVO:5366]   Intake/Output this shift:  No intake/output data recorded.  Physical Exam: General: NAD, resting quietly in bed  Head: Normocephalic, atraumatic. Moist oral mucosal membranes  Eyes: Anicteric  Lungs:  Left basilar [place], O2 [place]  Heart: Regular rate and rhythm  Abdomen:  Soft, nontender, nondistended  Extremities: No peripheral edema.  Neurologic: Nonfocal, moving all four extremities  Skin: Left upper arm wound  Access: Right IJ PermCath     Basic Metabolic Panel: Recent Labs  Lab 07/30/21 1308 07/31/21 0521 08/01/21 0510  NA 148* 146* 141  K 4.8 5.4* 4.8  CL 105 106 102  CO2 28 28 28   GLUCOSE 110* 86 92  BUN 52* 59* 36*  CREATININE 9.62* 10.08* 6.95*  CALCIUM 9.1 9.1 8.3*  PHOS  --   --  5.5*     Liver Function Tests: Recent Labs  Lab 07/30/21 1308 08/01/21 0510  AST 22  --   ALT 7  --   ALKPHOS 112  --   BILITOT 1.1  --   PROT 7.3  --   ALBUMIN 3.0* 2.8*    No results for input(s): LIPASE, AMYLASE in the last 168 hours. No results for input(s): AMMONIA in the last 168 hours.  CBC: Recent Labs  Lab 07/26/21 0741 07/30/21 1308 07/31/21 0521 08/01/21 0510  WBC 11.6* 8.4 8.9 7.5  NEUTROABS  --  6.9  --   --   HGB 7.7* 6.9* 7.1* 6.9*  HCT 25.1* 22.9* 23.9* 23.1*  MCV 98.4 100.0 102.1* 101.3*  PLT 173 185 163 164     Cardiac Enzymes: No results for input(s): CKTOTAL, CKMB, CKMBINDEX, TROPONINI in the last 168 hours.  BNP: Invalid input(s): POCBNP  CBG: No results for input(s): GLUCAP in the last 168 hours.  Microbiology: Results for orders placed or performed during the hospital encounter of 07/30/21  Resp Panel by RT-PCR (Flu A&B, Covid) Nasopharyngeal Swab     Status: None   Collection Time: 07/30/21  1:08 PM   Specimen: Nasopharyngeal Swab; Nasopharyngeal(NP) swabs in  vial transport medium  Result Value Ref Range Status   SARS Coronavirus 2 by RT PCR NEGATIVE NEGATIVE Final    Comment: (NOTE) SARS-CoV-2 target nucleic acids are NOT DETECTED.  The SARS-CoV-2 RNA is generally detectable in upper respiratory specimens during the acute phase of infection. The lowest concentration of SARS-CoV-2 viral copies this assay can detect is 138 copies/mL. A negative result does not preclude SARS-Cov-2 infection and should not be used as the sole basis for treatment or other patient management decisions. A negative result may occur with  improper specimen collection/handling, submission of  specimen other than nasopharyngeal swab, presence of viral mutation(s) within the areas targeted by this assay, and inadequate number of viral copies(<138 copies/mL). A negative result must be combined with clinical observations, patient history, and epidemiological information. The expected result is Negative.  Fact Sheet for Patients:  EntrepreneurPulse.com.au  Fact Sheet for Healthcare Providers:  IncredibleEmployment.be  This test is no t yet approved or cleared by the [place] FDA and  has been authorized for detection and/or diagnosis of SARS-CoV-2 by FDA under an Emergency Use Authorization (EUA). This EUA will remain  in effect (meaning this test can be used) for the duration of the COVID-19 declaration under Section 564(b)(1) of the Act, 21 U.S.C.section 360bbb-3(b)(1), unless the authorization is terminated  or revoked sooner.       Influenza A by PCR NEGATIVE NEGATIVE Final   Influenza B by PCR NEGATIVE NEGATIVE Final    Comment: (NOTE) The Xpert Xpress SARS-CoV-2/FLU/RSV plus assay is intended as an aid in the diagnosis of influenza from Nasopharyngeal swab specimens and should not be used as a sole basis for treatment. Nasal washings and aspirates are unacceptable for Xpert Xpress SARS-CoV-2/FLU/RSV testing.  Fact Sheet for Patients: EntrepreneurPulse.com.au  Fact Sheet for Healthcare Providers: IncredibleEmployment.be  This test is not yet approved or cleared by the [place] FDA and has been authorized for detection and/or diagnosis of SARS-CoV-2 by FDA under an Emergency Use Authorization (EUA). This EUA will remain in effect (meaning this test can be used) for the duration of the COVID-19 declaration under Section 564(b)(1) of the Act, 21 U.S.C. section 360bbb-3(b)(1), unless the authorization is terminated or revoked.  Performed at [facility], [address] [place], [place] [address]     Coagulation Studies: Recent Labs    07/30/21 1308  LABPROT 16.1*  INR 1.3*     Urinalysis: No results for input(s): COLORURINE, LABSPEC, PHURINE, GLUCOSEU, HGBUR, BILIRUBINUR, KETONESUR, PROTEINUR, UROBILINOGEN, NITRITE, LEUKOCYTESUR in the last 72 hours.  Invalid input(s): APPERANCEUR    Imaging: DG Chest Portable 1 View  Addendum Date: 07/30/2021   ADDENDUM REPORT: 07/30/2021 15:31 ADDENDUM: ADDENDUM PLEASE NOTE INCORRECT DICTATION PRIOR. CORRECT COMPARISON: Chest x-ray 07/23/2021 CORRECT FINDINGS: CORRECT FINDINGS Right chest wall dialysis catheter with tip overlying the right atrium. Cardiomegaly. The heart and mediastinal contours are unchanged. Aortic calcification. No diffuse patchy airspace and interstitial opacities. No pleural effusion. No pneumothorax. No acute osseous abnormality. CORRECT IMPRESSION: Cardiomegaly with pulmonary edema. No pleural effusions. Underlying pericardial effusion is not excluded. Electronically Signed   By: Iven Finn M.D.   On: 07/30/2021 15:31   Result Date: 07/30/2021 CLINICAL DATA:  Chest pain EXAM: PORTABLE CHEST 1 VIEW COMPARISON:  None. FINDINGS: The heart and mediastinal contours are within normal limits. Vascular clips in sternotomy wires overlie the mediastinum. No focal consolidation. No pulmonary edema. No pleural effusion. No pneumothorax. No acute osseous abnormality. IMPRESSION: No active disease. Electronically Signed: By:  Iven Finn M.D. On: 07/30/2021 15:25     Medications:    sodium chloride     sodium chloride     ceFEPime (MAXIPIME) IV Stopped (07/31/21 1740)    calcium acetate  1,334 mg Oral TID WC   Chlorhexidine Gluconate Cloth  6 each Topical Q0600   cinacalcet  30 mg Oral Q breakfast   docusate sodium  100 mg Oral BID   ferrous sulfate  325 mg Oral BID WC   heparin  5,000 Units Subcutaneous Q8H   mouth rinse  15 mL Mouth Rinse BID   metoprolol tartrate  50 mg Oral BID    pantoprazole  40 mg Oral Daily   sodium chloride, sodium chloride, acetaminophen **OR** acetaminophen, albuterol, alteplase, diphenhydrAMINE-zinc acetate, heparin, lidocaine (PF), lidocaine-prilocaine, ondansetron **OR** ondansetron (ZOFRAN) IV, pentafluoroprop-tetrafluoroeth  Assessment/ Plan:  [name] is a 55 y.o.  male with a past medical history of alcohol abuse, anemia, diastolic chronic heart failure, ACS, COPD, hypertension, end-stage renal disease on hemodialysis.  He presents to the emergency room with complaints of shortness of breath.  He has been admitted for Volume overload [E87.70] Acute respiratory failure with hypoxia ([place]) [J96.01] ESRD on hemodialysis ([place]) [N18.6, Z99.2]  [facility] [address]/MWF/Lt AVF/67kg  End-stage renal disease with fluid overload on hemodialysis.  Will maintain outpatient schedule if possible.  Received dialysis treatment yesterday, UF goal 3 L achieved.  Would benefit from additional treatment and fluid removal today, patient refuses this treatment.  States he will receive treatment tomorrow.  Discussed with patient his wishes to continue versus terminate further dialysis treatments.  Patient mentions that he knows he has given up since the death of his mother and brother.  He states tearfully that he has no other family.  Patient has 2 grown daughters who he does not have a good relationship with, per him.  Patient unable to definitively state if he wanted to continue or terminate dialysis at this time.  Explained to patient his need for antibiotics for his bloodstream infection found during a previous admission.  He states his understanding.  Also reminded patient that LUE wound may need surgical intervention later this week.  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  Next dialysis treatment tomorrow if patient remains inpatient.  2. Anemia of chronic kidney disease Lab Results  Component Value Date   HGB 6.9 (L)  08/01/2021  Hemoglobin not at goal.  EPO ordered with dialysis treatments.  Will defer blood transfusion to primary team  3. Secondary Hyperparathyroidism: Lab Results  Component Value Date   PTH 669 (H) 02/20/2020   CALCIUM 8.3 (L) 08/01/2021   CAION 1.12 (L) 06/02/2021   PHOS 5.5 (H) 08/01/2021  Calcium and phosphorus not at goal.  Calcium acetate with meals  4.  Chronic systolic heart failure.  Echo from 07/20/2021 indicates EF 45 to 50%.   LOS: 1   11/10/20221:17 PM

## 2021-08-01 NOTE — TOC Initial Note (Signed)
Transition of Care [facility]) - Initial/Assessment Note    Patient Details  Name: Jordan [name] MRN: [id number] Date of Birth: 11-Mar-1966  [facility]) CM/SW Contact:    Beverly Sessions, RN Phone Number: 08/01/2021, 10:50 AM  Clinical Narrative:                  Patient was assessed by [facility] previous admission 10/31 See note below " Readmission prevention screen complete. CSW met with patient. No supports at bedside. CSW introduced role and explained that discharge planning would be discussed. Patient PCP was previous Webb Silversmith, NP at [facility] but she is no longer a provider there. They were able to transfer him to [name], NP. He will have to be seen at their office at [facility] in [place] until the construction at [facility] is complete at the end of December/beginning of January. Patient does not have transportation to appointments and dialysis. Emailed referral to [facility] [facility]. This will be at no cost to patient but requires compliance. Patient goes to [facility]. No issues obtaining medications. No home health or DME use other than prn oxygen prior to admission. Patient gets his oxygen through [employer]. Patient asked about assistance with his rent and utilities. Got phone number to apply for Energy/Water Assistance through [employer]. Will give to patient when he returns from HD. No further concerns. CSW encouraged patient to contact CSW as needed. CSW will continue to follow patient for support and facilitate return home when stable.   Attempted to follow up with patient to determine if he has been using [facility] to HD, however patient has signed AMA paper work for this admission        Patient Goals and CMS Choice        Expected Discharge Plan and Services                                                Prior Living Arrangements/Services                       Activities of  Daily Living Home Assistive Devices/Equipment: Dentures (specify type), Walker (specify type) ADL Screening (condition at time of admission) Patient's cognitive ability adequate to safely complete daily activities?: Yes Is the patient deaf or have difficulty hearing?: No Does the patient have difficulty seeing, even when wearing glasses/contacts?: No Does the patient have difficulty concentrating, remembering, or making decisions?: No Patient able to express need for assistance with ADLs?: Yes Does the patient have difficulty dressing or bathing?: No Independently performs ADLs?: Yes (appropriate for developmental age) Does the patient have difficulty walking or climbing stairs?: No Weakness of Legs: Both Weakness of Arms/Hands: None  Permission Sought/Granted                  Emotional Assessment              Admission diagnosis:  Volume overload [E87.70] Acute respiratory failure with hypoxia ([place]) [J96.01] ESRD on hemodialysis ([place]) [N18.6, Z99.2] Patient Active Problem List   Diagnosis Date Noted   CAP (community acquired pneumonia) 07/30/2021   ABLA (acute blood loss anemia) 07/17/2021   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 ([place]) 07/17/2021   AV fistula infection, 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/17/2021   Bacteremia 07/17/2021  AV fistula thrombosis, 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/17/2021   Pressure injury of skin 07/15/2021   Acute on chronic respiratory failure with hypoxemia ([place]) 07/12/2021   Ascites    Protein-calorie malnutrition, severe 06/05/2021   Hypervolemia associated with renal insufficiency 06/02/2021   Symptomatic anemia 05/28/2021   Acute on chronic diastolic CHF (congestive heart failure) ([place]) 05/28/2021   Hyperkalemia 05/28/2021   Pulmonary edema 04/29/2021   Pruritus 04/29/2021   COVID-19 virus infection 04/29/2021   Thrombocytopenia ([place]) 04/08/2021   COPD (chronic obstructive pulmonary disease) ([place]) 04/08/2021   CHF (congestive  heart failure) ([place]) 03/25/2021   PAF (paroxysmal atrial fibrillation) ([place]) 03/20/2021   Chronic diastolic CHF (congestive heart failure) ([place]) 03/20/2021   Alcohol abuse 03/20/2021   Vision loss of left eye 03/20/2021   SOB (shortness of breath) 03/20/2021   Shortness of breath 03/10/2021   Abscess of buttock 03/02/2021   Fluid overload 03/02/2021   Multifocal pneumonia 01/13/2021   Current mild episode of major depressive disorder without prior episode ([place]) 04/09/2020   Atrial fibrillation with RVR ([place]) 04/04/2020   Non-compliance with renal dialysis ([place]) 03/21/2020   Acute CHF (congestive heart failure) ([place]) 03/12/2020   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 ([place]) 02/03/2020   Acute on chronic combined systolic and diastolic CHF (congestive heart failure) ([place]) 02/03/2020   Acute on chronic respiratory failure with hypoxia ([place]) 11/12/2019   Aortic atherosclerosis ([place]) 11/12/2019   Emphysema of lung ([place]) 11/12/2019   Volume overload 10/05/2019   Elevated troponin 05/17/2018   Anemia in ESRD (end-stage renal disease) ([place]) 05/17/2018   ESRD on dialysis ([place]) 05/26/2017   Essential hypertension 05/26/2017   Moderate protein-calorie malnutrition ([place]) 08/13/2016   Secondary hyperparathyroidism of renal origin ([place]) 08/13/2016   Tobacco abuse 08/04/2016   PCP:  Hoy Register, MD Pharmacy:   [place], [place] - [address] [place] [address] Phone: [phone] Fax: [phone]  [facility] [place] [place] - [address], [place] [address] Phone: [phone] Fax: [phone]     Social Determinants of Health (SDOH) Interventions    Readmission Risk Interventions Readmission Risk Prevention Plan 07/22/2021  Transportation Screening Complete  Medication Review ([place]) Complete  PCP or Specialist appointment within  3-5 days of discharge Complete  SW Recovery Care/Counseling Consult Complete  [place] Not Applicable  Some recent data might be hidden

## 2021-08-01 NOTE — Discharge Summary (Signed)
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 male with medical history significant for hypertension, end-stage renal disease on hemodialysis Monday Wednesday Friday, noncompliance with dialysis, combined heart failure, paroxysmal atrial fibrillation, history of COPD, who presents emergency department for chief concerns of shortness of breath.   He denies chest pain.  He reports that he does not know why he missed dialysis on Monday.  He reports he had a full 3-1/2 dialysis session on Saturday.   He endorses dysuria that has been ongoing for about 3 days.  He denies hematuria.  He denies nausea, vomiting, abdominal pain, dysphagia, fever, sick contacts.  He also endorses a cough that is productive of yellow phlegm.  He reports this is new for him.  Hospital Course:   Note patient left AMA. He did so before I was able to meet with him.  # Acute hypoxic respiratory failure # ESRD on hemodialysis Secondary to volume overload from missed dialysis. Dialyzed 11/9 w/  ultrafiltration   # ESRD on mwf hemodialysis - nephrology consulted   # Bacteremia Recent hospitalization where blood cultures grew serratia, infected left AV graft removed by vascular surgery on 10/26. Left AMA last hospitalization and again this hospitalization. Receiving cefepime with dialysis. - needs to continue cefepime w/ dialysis and needs f/u with infectious disease   # Surgical wound S/p av graft removal by vascular surgery last month. Poor healing. Vascular surgery consulted, their plan was to the OR tomorrow for debridement and placement of wound vac.    # Anemia of chronic kidney disease Hgb 7.1 which is stable and at baseline - epo w/ dialysis    # Hx etoh   # history a fib Here in sinus rhythm. Not anticoagualted presume secondary to bleeding risk or noncompliance   # COPD Quiescent  Procedures: hemodialysis   Consultations: Nephrology, vascular surgery  Discharge Exam: Vitals:   08/01/21 0321 08/01/21 0715  BP: 101/65 108/84  Pulse: (!) 59 62  Resp: 18 18  Temp:  (!) 97.4 F (36.3 C)  SpO2: 96% 100%    No exam today as left prior to my evaluation  Discharge Instructions    Allergies as of 08/01/2021       Reactions   Lisinopril Swelling        Medication List     TAKE these medications    albuterol 108 (90 Base) MCG/ACT inhaler Commonly known as: VENTOLIN HFA Inhale 2 puffs into the lungs every 4 (four) hours as needed for wheezing or shortness of breath.   calcium acetate 667 MG capsule Commonly  known as: PHOSLO Take 2 capsules (1,334 mg total) by mouth 3 (three) times daily with meals.   cinacalcet 30 MG tablet Commonly known as: SENSIPAR Take 1 tablet (30 mg total) by mouth daily with breakfast.   docusate sodium 100 MG capsule Commonly known as: COLACE Take 1 capsule (100 mg total) by mouth 2 (two) times daily.   epoetin alfa 4000 UNIT/ML injection Commonly known as: EPOGEN Inject 1 mL (4,000 Units total) into the vein  every Monday, Wednesday, and Friday with hemodialysis.   ferrous sulfate 325 (65 FE) MG tablet Take 1 tablet (325 mg total) by mouth 2 (two) times daily with a meal.   metoprolol tartrate 50 MG tablet Commonly known as: LOPRESSOR Take 1 tablet (50 mg total) by mouth 2 (two) times daily.   pantoprazole 20 MG tablet Commonly known as: Protonix Take 1 tablet (20 mg total) by mouth 2 (two) times daily before a meal.       Allergies  Allergen Reactions   Lisinopril Swelling      The results of significant diagnostics from this hospitalization (including imaging, microbiology, ancillary and laboratory) are listed below for reference.    Significant Diagnostic Studies: DG Abd 1 View  Result Date: 07/03/2021 CLINICAL DATA:  Abdominal pain EXAM: ABDOMEN - 1 VIEW COMPARISON:  None. FINDINGS: There is loss of the properitoneal fat flank stripes and central crowding of aerated loops of bowel suggesting underlying ascites. Underpenetration precludes evaluation of the visceral shadows. Nonobstructive bowel gas pattern. Calcifications within the mid abdomen correspond to renovascular calcifications. Advanced vascular calcifications noted within the pelvis. No gross free intraperitoneal gas. IMPRESSION: Suspected at least moderate ascites. Nonobstructive bowel gas pattern. . Electronically Signed   By: Fidela Salisbury M.D.   On: 07/03/2021 22:56   PERIPHERAL VASCULAR CATHETERIZATION  Result Date: 07/26/2021 See surgical note for result.  PERIPHERAL VASCULAR CATHETERIZATION  Result Date: 07/15/2021 See surgical note for result.  US Venous Img Lower Bilateral (DVT)  Result Date: 07/05/2021 CLINICAL DATA:  55 year old male with a history bilateral leg pain EXAM: BILATERAL LOWER EXTREMITY VENOUS DOPPLER ULTRASOUND TECHNIQUE: Gray-scale sonography with graded compression, as well as color Doppler and duplex ultrasound were performed to evaluate the lower extremity deep venous systems from the  level of the common femoral vein and including the common femoral, femoral, profunda femoral, popliteal and calf veins including the posterior tibial, peroneal and gastrocnemius veins when visible. The superficial great saphenous vein was also interrogated. Spectral Doppler was utilized to evaluate flow at rest and with distal augmentation maneuvers in the common femoral, femoral and popliteal veins. COMPARISON:  None. FINDINGS: RIGHT LOWER EXTREMITY Common Femoral Vein: No evidence of thrombus. Normal compressibility, respiratory phasicity and response to augmentation. Saphenofemoral Junction: No evidence of thrombus. Normal compressibility and flow on color Doppler imaging. Profunda Femoral Vein: No evidence of thrombus. Normal compressibility and flow on color Doppler imaging. Femoral Vein: No evidence of thrombus. Normal compressibility, respiratory phasicity and response to augmentation. Popliteal Vein: No evidence of thrombus. Normal compressibility, respiratory phasicity and response to augmentation. Calf Veins: No evidence of thrombus. Normal compressibility and flow on color Doppler imaging. Superficial Great Saphenous Vein: No evidence of thrombus. Normal compressibility and flow on color Doppler imaging. Other Findings:  None. LEFT LOWER EXTREMITY Common Femoral Vein: No evidence of thrombus. Normal compressibility, respiratory phasicity and response to augmentation. Saphenofemoral Junction: No evidence of thrombus. Normal compressibility and flow on color Doppler imaging. Profunda Femoral Vein: No evidence of thrombus. Normal compressibility and  flow on color Doppler imaging. Femoral Vein: No evidence of thrombus. Normal compressibility, respiratory phasicity and response to augmentation. Popliteal Vein: No evidence of thrombus. Normal compressibility, respiratory phasicity and response to augmentation. Calf Veins: No evidence of thrombus. Normal compressibility and flow on color Doppler imaging.  Superficial Great Saphenous Vein: No evidence of thrombus. Normal compressibility and flow on color Doppler imaging. Other Findings:  None. IMPRESSION: Sonographic survey of the bilateral lower extremities negative for DVT Electronically Signed   By: Corrie Mckusick D.O.   On: 07/05/2021 11:18   [place] Upper Ext Art Left  Result Date: 07/12/2021 CLINICAL DATA:  Left upper extremity AV fistula for hemodialysis access. Absent rule with tenderness. Evaluate for thrombosis EXAM: LEFT UPPER EXTREMITY ARTERIAL DUPLEX SCAN TECHNIQUE: Gray-scale sonography as well as color Doppler and duplex ultrasound was performed to evaluate the hemodialysis AV fistula. COMPARISON:  None. FINDINGS: Targeted interrogation of the patient's arteriovenous fistula at the elbow. The fistula is thrombosed with near occlusive clot. There is no internal blood flow. No focal fluid collection is seen. The remainder of the arteries in the left upper extremity were not interrogated. IMPRESSION: Thrombosed AV fistula in the left arm. These results were called by telephone at the time of interpretation on 07/12/2021 at 7:40 pm to provider PHILLIP STAFFORD , who verbally acknowledged these results. Electronically Signed   By: Keith Rake M.D.   On: 07/12/2021 19:41   US Paracentesis  Result Date: 07/04/2021 INDICATION: Ascites, request for paracentesis. EXAM: ULTRASOUND GUIDED  PARACENTESIS MEDICATIONS: Lidocaine 1% only. COMPLICATIONS: None immediate. PROCEDURE: Informed written consent was obtained from the patient after a discussion of the risks, benefits and alternatives to treatment. A timeout was performed prior to the initiation of the procedure. Initial ultrasound scanning demonstrates a large amount of ascites within the right lower abdominal quadrant. The right lower abdomen was prepped and draped in the usual sterile fashion. 1% lidocaine was used for local anesthesia. Following this, a 6 Fr Safe-T-Centesis catheter was introduced.  An ultrasound image was saved for documentation purposes. The paracentesis was performed. The catheter was removed and a dressing was applied. The patient tolerated the procedure well without immediate post procedural complication. FINDINGS: A total of approximately 5 L of clear yellow fluid was removed. Samples were sent to the laboratory as requested by the clinical team. IMPRESSION: Successful ultrasound-guided paracentesis yielding 5 liters of peritoneal fluid. Read By: Tsosie Billing PA-C Electronically Signed   By: Markus Daft M.D.   On: 07/04/2021 16:41   DG Chest Portable 1 View  Addendum Date: 07/30/2021   ADDENDUM REPORT: 07/30/2021 15:31 ADDENDUM: ADDENDUM PLEASE NOTE INCORRECT DICTATION PRIOR. CORRECT COMPARISON: Chest x-ray 07/23/2021 CORRECT FINDINGS: CORRECT FINDINGS Right chest wall dialysis catheter with tip overlying the right atrium. Cardiomegaly. The heart and mediastinal contours are unchanged. Aortic calcification. No diffuse patchy airspace and interstitial opacities. No pleural effusion. No pneumothorax. No acute osseous abnormality. CORRECT IMPRESSION: Cardiomegaly with pulmonary edema. No pleural effusions. Underlying pericardial effusion is not excluded. Electronically Signed   By: Iven Finn M.D.   On: 07/30/2021 15:31   Result Date: 07/30/2021 CLINICAL DATA:  Chest pain EXAM: PORTABLE CHEST 1 VIEW COMPARISON:  None. FINDINGS: The heart and mediastinal contours are within normal limits. Vascular clips in sternotomy wires overlie the mediastinum. No focal consolidation. No pulmonary edema. No pleural effusion. No pneumothorax. No acute osseous abnormality. IMPRESSION: No active disease. Electronically Signed: By: Iven Finn M.D. On: 07/30/2021 15:25   DG Chest Portable 1  View  Result Date: 07/23/2021 CLINICAL DATA:  55 year old male status post dialysis fistula resection yesterday, wound seeping blood. EXAM: PORTABLE CHEST 1 VIEW COMPARISON:  Portable chest 07/20/2021 and  earlier. FINDINGS: Portable AP upright view at 0524 hours. Left IJ dialysis catheter removed. Stable cardiomegaly. Calcified aortic atherosclerosis. Visualized tracheal air column is within normal limits. Somewhat low lung volumes with decreased pulmonary vascularity since last month. No overt edema. No pneumothorax, pleural effusion or consolidation. No acute osseous abnormality identified. Negative visible bowel gas. IMPRESSION: 1. Left IJ dialysis catheter removed. 2. Cardiomegaly with interval regression of pulmonary vascular congestion. No overt edema. 3.  Aortic Atherosclerosis (ICD10-I70.0). Electronically Signed   By: Genevie Ann M.D.   On: 07/23/2021 06:10   DG Chest Port 1 View  Result Date: 07/20/2021 CLINICAL DATA:  Central line placement. EXAM: PORTABLE CHEST 1 VIEW COMPARISON:  Radiograph 07/17/2021.  CT 05/03/2021 FINDINGS: Interval extubation. Left internal jugular central venous catheter tip projects over the upper SVC. Unchanged enlarged cardiopericardial silhouette. Advanced aortic atherosclerosis. Development of pulmonary edema from prior exam. There may be trace left pleural effusion. No pneumothorax. IMPRESSION: 1. Left internal jugular central venous catheter with tip over the upper SVC. No pneumothorax. 2. Unchanged cardiomegaly/enlargement of the cardiopericardial silhouette. Development of pulmonary edema from prior exam. Electronically Signed   By: Keith Rake M.D.   On: 07/20/2021 15:50   DG Chest Port 1 View  Result Date: 07/17/2021 CLINICAL DATA:  Respiratory failure EXAM: PORTABLE CHEST 1 VIEW COMPARISON:  Chest radiograph dated 07/17/2021. FINDINGS: Endotracheal tube with tip approximately 2.5 cm above the carina. There is cardiomegaly with vascular congestion. No focal consolidation, pleural effusion or pneumothorax. Atherosclerotic calcification of the aorta. No acute osseous pathology. IMPRESSION: Cardiomegaly with vascular congestion. No focal consolidation.  Electronically Signed   By: Anner Crete M.D.   On: 07/17/2021 20:47   DG Chest Port 1 View  Result Date: 07/17/2021 CLINICAL DATA:  Bleeding of left arm dialysis fistula. EXAM: PORTABLE CHEST 1 VIEW COMPARISON:  July 15, 2021 FINDINGS: Mild, stable diffusely increased interstitial lung markings are seen. There is no evidence of a pleural effusion or pneumothorax. The cardiac silhouette is markedly enlarged and unchanged in size. Marked severity calcification of the thoracic aorta is noted. The visualized skeletal structures are unremarkable. IMPRESSION: 1. Stable cardiomegaly. 2. Mild, stable, diffusely increased interstitial lung markings, which are likely, in part, chronic in nature. A mild superimposed component of interstitial edema cannot be excluded. Electronically Signed   By: Virgina Norfolk M.D.   On: 07/17/2021 00:42   DG Chest Port 1 View  Result Date: 07/15/2021 CLINICAL DATA:  Chest pain EXAM: PORTABLE CHEST 1 VIEW COMPARISON:  07/12/2021, CT 05/03/2021, chest x-ray 06/14/2021, 05/28/2021, 03/20/2021, 10/01/2019 FINDINGS: Cardiomegaly with globular cardiac configuration. Aortic atherosclerosis. Diffuse bilateral ground-glass opacity. No pleural effusion or pneumothorax. IMPRESSION: 1. Cardiomegaly with globular cardiac configuration which may be due to combination of chamber enlargement and pericardial effusion. 2. Diffuse bilateral ground-glass opacity, suspect underlying component of chronic lung disease but difficult to exclude mild superimposed edema Electronically Signed   By: Donavan Foil M.D.   On: 07/15/2021 16:03   DG Chest Portable 1 View  Result Date: 07/12/2021 CLINICAL DATA:  Shortness of breath x2 days. EXAM: PORTABLE CHEST 1 VIEW COMPARISON:  July 02, 2021 (8:32 a.m.) FINDINGS: Low lung volumes are seen. Mild, diffusely increased interstitial lung markings are noted. This is stable in appearance when compared to the prior study. There is no evidence of  a  pleural effusion or pneumothorax. The cardiac silhouette is markedly enlarged. There is marked severity calcification of the thoracic aorta. The visualized skeletal structures are unremarkable. IMPRESSION: 1. Stable cardiomegaly with mild pulmonary edema. Electronically Signed   By: Virgina Norfolk M.D.   On: 07/12/2021 19:20   ECHOCARDIOGRAM COMPLETE  Result Date: 07/20/2021    ECHOCARDIOGRAM REPORT   Patient Name:   DORSE LOCY Date of Exam: 07/20/2021 Medical Rec #:  [id number]        Height:       64.0 in Accession #:    [id number]       Weight:       168.2 lb Date of Birth:  1966-06-04        BSA:          1.818 m Patient Age:    47 years         BP:           117/74 mmHg Patient Gender: M                HR:           59 bpm. Exam Location:  [facility] Procedure: 2D Echo, Cardiac Doppler and Color Doppler Indications:     Bacteremia R78.81  History:         Patient has prior history of Echocardiogram examinations. Risk                  Factors:Hypertension.  Sonographer:     Alyse Low Roar Referring Phys:  [place] Diagnosing Phys: Skeet Latch MD IMPRESSIONS  1. Left ventricular ejection fraction, by estimation, is 45 to 50%. The left ventricle has mildly decreased function. The left ventricle has no regional wall motion abnormalities. There is mild concentric left ventricular hypertrophy. Left ventricular diastolic parameters are consistent with Grade II diastolic dysfunction (pseudonormalization). Elevated left ventricular end-diastolic pressure.  2. Right ventricular systolic function is normal. The right ventricular size is mildly enlarged. There is severely elevated pulmonary artery systolic pressure.  3. Left atrial size was severely dilated.  4. Right atrial size was severely dilated.  5. There is no evidence of cardiac tamponade.  6. The mitral valve is degenerative. Mild to moderate mitral valve regurgitation. No evidence of mitral stenosis. Moderate mitral annular  calcification.  7. Tricuspid valve regurgitation is severe.  8. The aortic valve is tricuspid. There is mild calcification of the aortic valve. There is mild thickening of the aortic valve. Aortic valve regurgitation is not visualized. No aortic stenosis is present.  9. The inferior vena cava is dilated in size with >50% respiratory variability, suggesting right atrial pressure of 8 mmHg. Comparison(s): Compared with the echo 06/03/21, the pericardial effusion is larger. There is no tamponade. FINDINGS  Left Ventricle: Left ventricular ejection fraction, by estimation, is 45 to 50%. The left ventricle has mildly decreased function. The left ventricle has no regional wall motion abnormalities. The left ventricular internal cavity size was normal in size. There is mild concentric left ventricular hypertrophy. Left ventricular diastolic parameters are consistent with Grade II diastolic dysfunction (pseudonormalization). Elevated left ventricular end-diastolic pressure. Right Ventricle: The right ventricular size is mildly enlarged. No increase in right ventricular wall thickness. Right ventricular systolic function is normal. There is severely elevated pulmonary artery systolic pressure. The tricuspid regurgitant velocity is 4.10 m/s, and with an assumed right atrial pressure of 8 mmHg, the estimated right ventricular systolic pressure is 40.1 mmHg. Left Atrium: Left atrial size  was severely dilated. Right Atrium: Right atrial size was severely dilated. Pericardium: Trivial pericardial effusion is present. There is no evidence of cardiac tamponade. Mitral Valve: The mitral valve is degenerative in appearance. There is moderate thickening of the mitral valve leaflet(s). There is moderate calcification of the mitral valve leaflet(s). Moderate mitral annular calcification. Mild to moderate mitral valve regurgitation. No evidence of mitral valve stenosis. Tricuspid Valve: The tricuspid valve is normal in structure.  Tricuspid valve regurgitation is severe. No evidence of tricuspid stenosis. Aortic Valve: The aortic valve is tricuspid. There is mild calcification of the aortic valve. There is mild thickening of the aortic valve. Aortic valve regurgitation is not visualized. No aortic stenosis is present. Aortic valve peak gradient measures 8.5 mmHg. Pulmonic Valve: The pulmonic valve was normal in structure. Pulmonic valve regurgitation is trivial. No evidence of pulmonic stenosis. Aorta: The aortic root is normal in size and structure. Venous: The inferior vena cava is dilated in size with greater than 50% respiratory variability, suggesting right atrial pressure of 8 mmHg. IAS/Shunts: No atrial level shunt detected by color flow Doppler. Additional Comments: There is pleural effusion in the right lateral region.  LEFT VENTRICLE PLAX 2D LVIDd:         4.70 cm      Diastology LVIDs:         3.70 cm      LV e' medial:    5.11 cm/s LV PW:         1.30 cm      LV E/e' medial:  23.9 LV IVS:        1.30 cm      LV e' lateral:   9.25 cm/s LVOT diam:     1.70 cm      LV E/e' lateral: 13.2 LVOT Area:     2.27 cm  LV Volumes (MOD) LV vol d, MOD A2C: 105.0 ml LV vol d, MOD A4C: 113.0 ml LV vol s, MOD A2C: 64.0 ml LV vol s, MOD A4C: 65.9 ml LV SV MOD A2C:     41.0 ml LV SV MOD A4C:     113.0 ml LV SV MOD BP:      41.7 ml RIGHT VENTRICLE RV Basal diam:  4.70 cm RV Mid diam:    3.70 cm RV S prime:     8.70 cm/s TAPSE (M-mode): 1.0 cm LEFT ATRIUM             Index        RIGHT ATRIUM           Index LA diam:        4.40 cm 2.42 cm/m   RA Area:     24.50 cm LA Vol (A2C):   88.8 ml 48.86 ml/m  RA Volume:   85.70 ml  47.15 ml/m LA Vol (A4C):   60.4 ml 33.23 ml/m LA Biplane Vol: 73.0 ml 40.16 ml/m  AORTIC VALVE                 PULMONIC VALVE AV Area (Vmax): 1.91 cm     PV Vmax:          0.96 m/s AV Vmax:        146.00 cm/s  PV Peak grad:     3.7 mmHg AV Peak Grad:   8.5 mmHg     PR End Diast Vel: 13.25 msec LVOT Vmax:      123.00 cm/s   RVOT Peak grad:   1 mmHg  AORTA Ao Root diam: 2.90 cm MITRAL VALVE                TRICUSPID VALVE MV Area (PHT): 5.54 cm     TR Peak grad:   67.2 mmHg MV Decel Time: 137 msec     TR Vmax:        410.00 cm/s MV E velocity: 122.00 cm/s MV A velocity: 93.80 cm/s   SHUNTS MV E/A ratio:  1.30         Systemic Diam: 1.70 cm MV A Prime:    6.7 cm/s Skeet Latch MD Electronically signed by Skeet Latch MD Signature Date/Time: 07/20/2021/3:09:33 PM    Final     Microbiology: Recent Results (from the past 240 hour(s))  Resp Panel by RT-PCR (Flu A&B, Covid) Nasopharyngeal Swab     Status: None   Collection Time: 07/23/21  6:00 AM   Specimen: Nasopharyngeal Swab; Nasopharyngeal(NP) swabs in vial transport medium  Result Value Ref Range Status   SARS Coronavirus 2 by RT PCR NEGATIVE NEGATIVE Final    Comment: (NOTE) SARS-CoV-2 target nucleic acids are NOT DETECTED.  The SARS-CoV-2 RNA is generally detectable in upper respiratory specimens during the acute phase of infection. The lowest concentration of SARS-CoV-2 viral copies this assay can detect is 138 copies/mL. A negative result does not preclude SARS-Cov-2 infection and should not be used as the sole basis for treatment or other patient management decisions. A negative result may occur with  improper specimen collection/handling, submission of specimen other than nasopharyngeal swab, presence of viral mutation(s) within the areas targeted by this assay, and inadequate number of viral copies(<138 copies/mL). A negative result must be combined with clinical observations, patient history, and epidemiological information. The expected result is Negative.  Fact Sheet for Patients:  EntrepreneurPulse.com.au  Fact Sheet for Healthcare Providers:  IncredibleEmployment.be  This test is no t yet approved or cleared by the [place] FDA and  has been authorized for detection and/or diagnosis of SARS-CoV-2  by FDA under an Emergency Use Authorization (EUA). This EUA will remain  in effect (meaning this test can be used) for the duration of the COVID-19 declaration under Section 564(b)(1) of the Act, 21 U.S.C.section 360bbb-3(b)(1), unless the authorization is terminated  or revoked sooner.       Influenza A by PCR NEGATIVE NEGATIVE Final   Influenza B by PCR NEGATIVE NEGATIVE Final    Comment: (NOTE) The Xpert Xpress SARS-CoV-2/FLU/RSV plus assay is intended as an aid in the diagnosis of influenza from Nasopharyngeal swab specimens and should not be used as a sole basis for treatment. Nasal washings and aspirates are unacceptable for Xpert Xpress SARS-CoV-2/FLU/RSV testing.  Fact Sheet for Patients: EntrepreneurPulse.com.au  Fact Sheet for Healthcare Providers: IncredibleEmployment.be  This test is not yet approved or cleared by the [place] FDA and has been authorized for detection and/or diagnosis of SARS-CoV-2 by FDA under an Emergency Use Authorization (EUA). This EUA will remain in effect (meaning this test can be used) for the duration of the COVID-19 declaration under Section 564(b)(1) of the Act, 21 U.S.C. section 360bbb-3(b)(1), unless the authorization is terminated or revoked.  Performed at [facility], [address] [place], [place] [address]   Resp Panel by RT-PCR (Flu A&B, Covid) Nasopharyngeal Swab     Status: None   Collection Time: 07/30/21  1:08 PM   Specimen: Nasopharyngeal Swab; Nasopharyngeal(NP) swabs in vial transport medium  Result Value Ref Range Status   SARS Coronavirus 2 by  RT PCR NEGATIVE NEGATIVE Final    Comment: (NOTE) SARS-CoV-2 target nucleic acids are NOT DETECTED.  The SARS-CoV-2 RNA is generally detectable in upper respiratory specimens during the acute phase of infection. The lowest concentration of SARS-CoV-2 viral copies this assay can detect is 138 copies/mL. A negative result does  not preclude SARS-Cov-2 infection and should not be used as the sole basis for treatment or other patient management decisions. A negative result may occur with  improper specimen collection/handling, submission of specimen other than nasopharyngeal swab, presence of viral mutation(s) within the areas targeted by this assay, and inadequate number of viral copies(<138 copies/mL). A negative result must be combined with clinical observations, patient history, and epidemiological information. The expected result is Negative.  Fact Sheet for Patients:  EntrepreneurPulse.com.au  Fact Sheet for Healthcare Providers:  IncredibleEmployment.be  This test is no t yet approved or cleared by the [place] FDA and  has been authorized for detection and/or diagnosis of SARS-CoV-2 by FDA under an Emergency Use Authorization (EUA). This EUA will remain  in effect (meaning this test can be used) for the duration of the COVID-19 declaration under Section 564(b)(1) of the Act, 21 U.S.C.section 360bbb-3(b)(1), unless the authorization is terminated  or revoked sooner.       Influenza A by PCR NEGATIVE NEGATIVE Final   Influenza B by PCR NEGATIVE NEGATIVE Final    Comment: (NOTE) The Xpert Xpress SARS-CoV-2/FLU/RSV plus assay is intended as an aid in the diagnosis of influenza from Nasopharyngeal swab specimens and should not be used as a sole basis for treatment. Nasal washings and aspirates are unacceptable for Xpert Xpress SARS-CoV-2/FLU/RSV testing.  Fact Sheet for Patients: EntrepreneurPulse.com.au  Fact Sheet for Healthcare Providers: IncredibleEmployment.be  This test is not yet approved or cleared by the [place] FDA and has been authorized for detection and/or diagnosis of SARS-CoV-2 by FDA under an Emergency Use Authorization (EUA). This EUA will remain in effect (meaning this test can be used) for the  duration of the COVID-19 declaration under Section 564(b)(1) of the Act, 21 U.S.C. section 360bbb-3(b)(1), unless the authorization is terminated or revoked.  Performed at [facility], [address] [place], [place] [address]      Labs: Basic Metabolic Panel: Recent Labs  Lab 07/30/21 1308 07/31/21 0521 08/01/21 0510  NA 148* 146* 141  K 4.8 5.4* 4.8  CL 105 106 102  CO2 28 28 28   GLUCOSE 110* 86 92  BUN 52* 59* 36*  CREATININE 9.62* 10.08* 6.95*  CALCIUM 9.1 9.1 8.3*  PHOS  --   --  5.5*   Liver Function Tests: Recent Labs  Lab 07/30/21 1308 08/01/21 0510  AST 22  --   ALT 7  --   ALKPHOS 112  --   BILITOT 1.1  --   PROT 7.3  --   ALBUMIN 3.0* 2.8*   No results for input(s): LIPASE, AMYLASE in the last 168 hours. No results for input(s): AMMONIA in the last 168 hours. CBC: Recent Labs  Lab 07/26/21 0741 07/30/21 1308 07/31/21 0521 08/01/21 0510  WBC 11.6* 8.4 8.9 7.5  NEUTROABS  --  6.9  --   --   HGB 7.7* 6.9* 7.1* 6.9*  HCT 25.1* 22.9* 23.9* 23.1*  MCV 98.4 100.0 102.1* 101.3*  PLT 173 185 163 164   Cardiac Enzymes: No results for input(s): CKTOTAL, CKMB, CKMBINDEX, TROPONINI in the last 168 hours. BNP: BNP (last 3 results) Recent Labs    06/26/21 0804  07/23/21 0500 07/31/21 0521  BNP 4,471.7* >4,500.0* >4,500.0*    ProBNP (last 3 results) No results for input(s): PROBNP in the last 8760 hours.  CBG: No results for input(s): GLUCAP in the last 168 hours.     Signed:  Desma Maxim MD.  [facility] 08/01/2021, 1:13 PM

## 2021-08-02 ENCOUNTER — Ambulatory Visit (INDEPENDENT_AMBULATORY_CARE_PROVIDER_SITE_OTHER): Payer: Medicare Other | Admitting: Nurse Practitioner

## 2021-08-02 ENCOUNTER — Other Ambulatory Visit (INDEPENDENT_AMBULATORY_CARE_PROVIDER_SITE_OTHER): Payer: Self-pay | Admitting: Nurse Practitioner

## 2021-08-02 ENCOUNTER — Ambulatory Visit: Admit: 2021-08-02 | Payer: Medicare Other | Admitting: Vascular Surgery

## 2021-08-02 ENCOUNTER — Encounter (INDEPENDENT_AMBULATORY_CARE_PROVIDER_SITE_OTHER): Payer: Self-pay | Admitting: Nurse Practitioner

## 2021-08-02 ENCOUNTER — Other Ambulatory Visit: Payer: Self-pay

## 2021-08-02 VITALS — BP 124/79 | HR 79 | Resp 16 | Wt 149.8 lb

## 2021-08-02 DIAGNOSIS — Z992 Dependence on renal dialysis: Secondary | ICD-10-CM

## 2021-08-02 DIAGNOSIS — I10 Essential (primary) hypertension: Secondary | ICD-10-CM

## 2021-08-02 DIAGNOSIS — N186 End stage renal disease: Secondary | ICD-10-CM

## 2021-08-02 SURGERY — DEBRIDEMENT, WOUND
Anesthesia: General | Laterality: Left

## 2021-08-03 ENCOUNTER — Encounter (INDEPENDENT_AMBULATORY_CARE_PROVIDER_SITE_OTHER): Payer: Self-pay | Admitting: Nurse Practitioner

## 2021-08-03 LAB — HEPATITIS B SURFACE ANTIBODY, QUANTITATIVE: Hep B S AB Quant (Post): 970.4 m[IU]/mL (ref >9.9)

## 2021-08-03 NOTE — Progress Notes (Signed)
Subjective:    Patient ID: Jordan [name], male    DOB: [birth year], 55 y.o.   MRN: [id number] Chief Complaint  Patient presents with   Follow-up    1wk post wound recheck    Jordan [name] is a 55 year old male that presents today for approximately 3 weeks after infected graft removal.  Patient was recently hospitalized due to infection of this area.  He was scheduled to have surgery today for debridement with wound VAC placement however the patient left AMA.  He noted that he had a family emergency that he needed to attend to.  Today is the incision has dehisced with the worse opening near the proximal portion.  It has significant drainage, but not foul-smelling.   Review of Systems  Cardiovascular:        Arm swelling  Skin:  Positive for wound.  All other systems reviewed and are negative.     Objective:   Physical Exam Vitals reviewed.  HENT:     Head: Normocephalic.  Cardiovascular:     Rate and Rhythm: Normal rate.     Pulses: Normal pulses.  Pulmonary:     Effort: Pulmonary effort is normal.  Skin:    General: Skin is warm and dry.     Findings: Wound present.  Neurological:     Mental Status: He is alert and oriented to person, place, and time.  Psychiatric:        Mood and Affect: Mood normal.        Behavior: Behavior normal.        Thought Content: Thought content normal.        Judgment: Judgment normal.    BP 124/79 (BP Location: Right Arm)   Pulse 79   Resp 16   Wt 149 lb 12.8 oz (67.9 kg)   BMI 25.71 kg/m   Past Medical History:  Diagnosis Date   Anemia    Aortic atherosclerosis ([place]) 11/12/2019   ED (erectile dysfunction)    Emphysema of lung ([place]) 11/12/2019   ESRD on hemodialysis ([place])    M/W/F in [place], [place]   ETOH abuse    History of blood transfusion    Hypertension    Wears dentures     Social History   Socioeconomic History   Marital status: Single    Spouse name: Not on file   Number of children: Not on file   Years of  education: Not on file   Highest education level: Not on file  Occupational History   Occupation: disabled  Tobacco Use   Smoking status: Every Day    Packs/day: 0.50    Years: 40.00    Pack years: 20.00    Types: Cigarettes   Smokeless tobacco: Never  Vaping Use   Vaping Use: Never used  Substance and Sexual Activity   Alcohol use: Not Currently    Alcohol/week: 21.0 standard drinks    Types: 21 Cans of beer per week    Comment: last drink 6 months ago   Drug use: No   Sexual activity: Not Currently  Other Topics Concern   Not on file  Social History Narrative   Not on file   Social Determinants of Health   Financial Resource Strain: Not on file  Food Insecurity: Not on file  Transportation Needs: Not on file  Physical Activity: Not on file  Stress: Not on file  Social Connections: Not on file  Intimate Partner Violence: Not on file    Past  Surgical History:  Procedure Laterality Date   A/V FISTULAGRAM Left 07/15/2021   Procedure: A/V FISTULAGRAM;  Surgeon: Algernon Huxley, MD;  Location: [facility];  Service: Cardiovascular;  Laterality: Left;   BASCILIC VEIN TRANSPOSITION Left 08/07/2016   Procedure: LEFT BASILIC VEIN TRANSPOSITION;  Surgeon: Angelia Mould, MD;  Location: [place];  Service: Vascular;  Laterality: Left;   CLOSED REDUCTION NASAL FRACTURE N/A 08/11/2019   Procedure: CLOSED REDUCTION NASAL FRACTURE WITH STABILIZATION;  Surgeon: Irene Limbo, MD;  Location: [place];  Service: Plastics;  Laterality: N/A;   COLONOSCOPY N/A 08/12/2016   Procedure: COLONOSCOPY;  Surgeon: Otis Brace, MD;  Location: [facility];  Service: Gastroenterology;  Laterality: N/A;   COLONOSCOPY WITH PROPOFOL N/A 06/05/2021   Procedure: COLONOSCOPY WITH PROPOFOL;  Surgeon: Sharyn Creamer, MD;  Location: [place];  Service: Gastroenterology;  Laterality: N/A;   DIALYSIS/PERMA CATHETER INSERTION N/A 07/26/2021   Procedure: DIALYSIS/PERMA CATHETER INSERTION;   Surgeon: Algernon Huxley, MD;  Location: [facility];  Service: Cardiovascular;  Laterality: N/A;   ESOPHAGOGASTRODUODENOSCOPY N/A 08/12/2016   Procedure: ESOPHAGOGASTRODUODENOSCOPY (EGD);  Surgeon: Otis Brace, MD;  Location: [facility];  Service: Gastroenterology;  Laterality: N/A;   ESOPHAGOGASTRODUODENOSCOPY (EGD) WITH PROPOFOL N/A 06/05/2021   Procedure: ESOPHAGOGASTRODUODENOSCOPY (EGD) WITH PROPOFOL;  Surgeon: Sharyn Creamer, MD;  Location: [place];  Service: Gastroenterology;  Laterality: N/A;   INSERTION OF DIALYSIS CATHETER N/A 08/07/2016   Procedure: INSERTION OF TUNNELED DIALYSIS CATHETER;  Surgeon: Angelia Mould, MD;  Location: [place];  Service: Vascular;  Laterality: N/A;   LIGATION OF ARTERIOVENOUS  FISTULA Left 09/12/2016   Procedure: BANDING OF LEFT  ARTERIOVENOUS  FISTULA;  Surgeon: Angelia Mould, MD;  Location: [place];  Service: Vascular;  Laterality: Left;   LIGATION OF ARTERIOVENOUS  FISTULA Left 07/17/2021   Procedure: LIGATION OF ARTERIOVENOUS  FISTULA;  Surgeon: Katha Cabal, MD;  Location: [facility];  Service: Vascular;  Laterality: Left;  also include placement of temporary dialysis catheter right or left groin   LOWER EXTREMITY INTERVENTION Right 12/02/2018   Procedure: LOWER EXTREMITY INTERVENTION;  Surgeon: Algernon Huxley, MD;  Location: [facility];  Service: Cardiovascular;  Laterality: Right;   POLYPECTOMY  06/05/2021   Procedure: POLYPECTOMY;  Surgeon: Sharyn Creamer, MD;  Location: [facility];  Service: Gastroenterology;;   TEMPORARY DIALYSIS CATHETER Left 07/17/2021   Procedure: TEMPORARY DIALYSIS CATHETER;  Surgeon: Katha Cabal, MD;  Location: [facility];  Service: Vascular;  Laterality: Left;    Family History  Problem Relation Age of Onset   Diabetes Mother    Kidney failure Mother    Healthy Father    Kidney failure Brother    Healthy Sister    Kidney disease Daughter    Post-traumatic stress disorder Neg  Hx    Bladder Cancer Neg Hx    Kidney cancer Neg Hx     Allergies  Allergen Reactions   Lisinopril Swelling    CBC Latest Ref Rng & Units 08/01/2021 07/31/2021 07/30/2021  WBC 4.0 - 10.5 K/uL 7.5 8.9 8.4  Hemoglobin 13.0 - 17.0 g/dL 6.9(L) 7.1(L) 6.9(L)  Hematocrit 39.0 - 52.0 % 23.1(L) 23.9(L) 22.9(L)  Platelets 150 - 400 K/uL 164 163 185      CMP     Component Value Date/Time   NA 141 08/01/2021 0510   K 4.8 08/01/2021 0510   CL 102 08/01/2021 0510   CO2 28 08/01/2021 0510   GLUCOSE 92 08/01/2021  0510   BUN 36 (H) 08/01/2021 0510   CREATININE 6.95 (H) 08/01/2021 0510   CALCIUM 8.3 (L) 08/01/2021 0510   PROT 7.3 07/30/2021 1308   ALBUMIN 2.8 (L) 08/01/2021 0510   AST 22 07/30/2021 1308   ALT 7 07/30/2021 1308   ALKPHOS 112 07/30/2021 1308   BILITOT 1.1 07/30/2021 1308   GFRNONAA 9 (L) 08/01/2021 0510   GFRAA 4 (L) 06/20/2020 0414     No results found.     Assessment & Plan:   1. ESRD on dialysis [facility]) The patient was previously in the hospital and was on the schedule to have surgery today for his wound.  We will place the patient back on the schedule for a debridement with wound VAC placement.  We will leave the sutures in place to try to maintain the integrity of the wound until that time.  The patient is still on antibiotics prescribed from the hospital.  We will try to work to obtain home health for the patient for dressing changes.  I discussed the procedure with the patient in addition to the risk, benefits and alternatives and he agrees to proceed.  2. Essential hypertension Continue antihypertensive medications as already ordered, these medications have been reviewed and there are no changes at this time.    Current Outpatient Medications on File Prior to Visit  Medication Sig Dispense Refill   albuterol (VENTOLIN HFA) 108 (90 Base) MCG/ACT inhaler Inhale 2 puffs into the lungs every 4 (four) hours as needed for wheezing or shortness of breath. 1 each 3    calcium acetate (PHOSLO) 667 MG capsule Take 2 capsules (1,334 mg total) by mouth 3 (three) times daily with meals. 90 capsule 2   cinacalcet (SENSIPAR) 30 MG tablet Take 1 tablet (30 mg total) by mouth daily with breakfast. 30 tablet 2   docusate sodium (COLACE) 100 MG capsule Take 1 capsule (100 mg total) by mouth 2 (two) times daily. 10 capsule 0   epoetin alfa (EPOGEN) 4000 UNIT/ML injection Inject 1 mL (4,000 Units total) into the vein every Monday, Wednesday, and Friday with hemodialysis. 1 mL    ferrous sulfate 325 (65 FE) MG tablet Take 1 tablet (325 mg total) by mouth 2 (two) times daily with a meal. 60 tablet 3   metoprolol tartrate (LOPRESSOR) 50 MG tablet Take 1 tablet (50 mg total) by mouth 2 (two) times daily. 60 tablet 0   pantoprazole (PROTONIX) 20 MG tablet Take 1 tablet (20 mg total) by mouth 2 (two) times daily before a meal. 60 tablet 1   No current facility-administered medications on file prior to visit.    There are no Patient Instructions on file for this visit. No follow-ups on file.   Kris Hartmann, NP

## 2021-08-05 ENCOUNTER — Telehealth (INDEPENDENT_AMBULATORY_CARE_PROVIDER_SITE_OTHER): Payer: Self-pay

## 2021-08-05 NOTE — Telephone Encounter (Signed)
I attempted to contact the patient and a message was left for a return call. I then contacted his brother to ask him to have the patient to call. Patient's brother stated he would let him know tonight to call me.

## 2021-08-06 ENCOUNTER — Emergency Department: Payer: Medicare Other

## 2021-08-06 ENCOUNTER — Inpatient Hospital Stay
Admission: EM | Admit: 2021-08-06 | Discharge: 2021-08-07 | DRG: 862 | Discharge status: Against Medical Advice | Payer: Medicare Other | Attending: Family Medicine | Admitting: Family Medicine

## 2021-08-06 ENCOUNTER — Other Ambulatory Visit: Payer: Self-pay

## 2021-08-06 DIAGNOSIS — T8141XA Infection following a procedure, superficial incisional surgical site, initial encounter: Principal | ICD-10-CM | POA: Diagnosis present

## 2021-08-06 DIAGNOSIS — I10 Essential (primary) hypertension: Secondary | ICD-10-CM | POA: Diagnosis present

## 2021-08-06 DIAGNOSIS — Z5329 Procedure and treatment not carried out because of patient's decision for other reasons: Secondary | ICD-10-CM | POA: Diagnosis present

## 2021-08-06 DIAGNOSIS — L89312 Pressure ulcer of right buttock, stage 2: Secondary | ICD-10-CM | POA: Diagnosis present

## 2021-08-06 DIAGNOSIS — I5033 Acute on chronic diastolic (congestive) heart failure: Secondary | ICD-10-CM | POA: Diagnosis present

## 2021-08-06 DIAGNOSIS — Z992 Dependence on renal dialysis: Secondary | ICD-10-CM

## 2021-08-06 DIAGNOSIS — T8149XA Infection following a procedure, other surgical site, initial encounter: Secondary | ICD-10-CM

## 2021-08-06 DIAGNOSIS — I480 Paroxysmal atrial fibrillation: Secondary | ICD-10-CM | POA: Diagnosis present

## 2021-08-06 DIAGNOSIS — I700 Atherosclerosis of aorta: Secondary | ICD-10-CM | POA: Diagnosis present

## 2021-08-06 DIAGNOSIS — T827XXA Infection and inflammatory reaction due to other cardiac and vascular devices, implants and grafts, initial encounter: Secondary | ICD-10-CM

## 2021-08-06 DIAGNOSIS — J449 Chronic obstructive pulmonary disease, unspecified: Secondary | ICD-10-CM | POA: Diagnosis present

## 2021-08-06 DIAGNOSIS — N186 End stage renal disease: Secondary | ICD-10-CM | POA: Diagnosis present

## 2021-08-06 DIAGNOSIS — Z9115 Patient's noncompliance with renal dialysis: Secondary | ICD-10-CM

## 2021-08-06 DIAGNOSIS — A488 Other specified bacterial diseases: Secondary | ICD-10-CM

## 2021-08-06 DIAGNOSIS — Y832 Surgical operation with anastomosis, bypass or graft as the cause of abnormal reaction of the patient, or of later complication, without mention of misadventure at the time of the procedure: Secondary | ICD-10-CM | POA: Diagnosis present

## 2021-08-06 DIAGNOSIS — Z833 Family history of diabetes mellitus: Secondary | ICD-10-CM

## 2021-08-06 DIAGNOSIS — Z841 Family history of disorders of kidney and ureter: Secondary | ICD-10-CM

## 2021-08-06 DIAGNOSIS — D631 Anemia in chronic kidney disease: Secondary | ICD-10-CM | POA: Diagnosis present

## 2021-08-06 DIAGNOSIS — T827XXD Infection and inflammatory reaction due to other cardiac and vascular devices, implants and grafts, subsequent encounter: Secondary | ICD-10-CM

## 2021-08-06 DIAGNOSIS — T8130XA Disruption of wound, unspecified, initial encounter: Secondary | ICD-10-CM

## 2021-08-06 DIAGNOSIS — L89322 Pressure ulcer of left buttock, stage 2: Secondary | ICD-10-CM | POA: Diagnosis present

## 2021-08-06 DIAGNOSIS — F17210 Nicotine dependence, cigarettes, uncomplicated: Secondary | ICD-10-CM | POA: Diagnosis present

## 2021-08-06 DIAGNOSIS — I132 Hypertensive heart and chronic kidney disease with heart failure and with stage 5 chronic kidney disease, or end stage renal disease: Secondary | ICD-10-CM | POA: Diagnosis present

## 2021-08-06 DIAGNOSIS — J439 Emphysema, unspecified: Secondary | ICD-10-CM | POA: Diagnosis present

## 2021-08-06 DIAGNOSIS — Z6829 Body mass index (BMI) 29.0-29.9, adult: Secondary | ICD-10-CM

## 2021-08-06 LAB — CBC WITH DIFFERENTIAL/PLATELET
Abs Immature Granulocytes: 0.06 10*3/uL (ref 0.00–0.07)
Basophils Absolute: 0.1 10*3/uL (ref 0.0–0.1)
Basophils Relative: 1 %
Eosinophils Absolute: 0.1 10*3/uL (ref 0.0–0.5)
Eosinophils Relative: 2 %
HCT: 23.7 % — ABNORMAL LOW (ref 39.0–52.0)
Hemoglobin: 7.4 g/dL — ABNORMAL LOW (ref 13.0–17.0)
Immature Granulocytes: 1 %
Lymphocytes Relative: 12 %
Lymphs Abs: 0.9 10*3/uL (ref 0.7–4.0)
MCH: 31 pg (ref 26.0–34.0)
MCHC: 31.2 g/dL (ref 30.0–36.0)
MCV: 99.2 fL (ref 80.0–100.0)
Monocytes Absolute: 0.9 10*3/uL (ref 0.1–1.0)
Monocytes Relative: 12 %
Neutro Abs: 5.3 10*3/uL (ref 1.7–7.7)
Neutrophils Relative %: 72 %
Platelets: 195 10*3/uL (ref 150–400)
RBC: 2.39 MIL/uL — ABNORMAL LOW (ref 4.22–5.81)
RDW: 18.2 % — ABNORMAL HIGH (ref 11.5–15.5)
WBC: 7.3 10*3/uL (ref 4.0–10.5)
nRBC: 0 % (ref 0.0–0.2)

## 2021-08-06 LAB — COMPREHENSIVE METABOLIC PANEL
ALT: 9 U/L (ref 0–44)
AST: 17 U/L (ref 15–41)
Albumin: 3.2 g/dL — ABNORMAL LOW (ref 3.5–5.0)
Alkaline Phosphatase: 102 U/L (ref 38–126)
Anion gap: 15 (ref 5–15)
BUN: 51 mg/dL — ABNORMAL HIGH (ref 6–20)
CO2: 27 mmol/L (ref 22–32)
Calcium: 9.2 mg/dL (ref 8.9–10.3)
Chloride: 101 mmol/L (ref 98–111)
Creatinine, Ser: 9.76 mg/dL — ABNORMAL HIGH (ref 0.61–1.24)
GFR, Estimated: 6 mL/min — ABNORMAL LOW (ref >60)
Glucose, Bld: 81 mg/dL (ref 70–99)
Potassium: 4.5 mmol/L (ref 3.5–5.1)
Sodium: 143 mmol/L (ref 135–145)
Total Bilirubin: 1.1 mg/dL (ref 0.3–1.2)
Total Protein: 7.7 g/dL (ref 6.5–8.1)

## 2021-08-06 LAB — SEDIMENTATION RATE: Sed Rate: 52 mm/hr — ABNORMAL HIGH (ref 0–20)

## 2021-08-06 LAB — LACTIC ACID, PLASMA: Lactic Acid, Venous: 2.1 mmol/L (ref 0.5–1.9)

## 2021-08-06 MED ORDER — HYDROXYZINE HCL 25 MG PO TABS
25.0000 mg | ORAL_TABLET | Freq: Once | ORAL | Status: AC
  Administered 2021-08-06: 25 mg via ORAL
  Filled 2021-08-06: qty 1

## 2021-08-06 MED ORDER — FENTANYL CITRATE PF 50 MCG/ML IJ SOSY
50.0000 ug | PREFILLED_SYRINGE | Freq: Once | INTRAMUSCULAR | Status: AC
  Administered 2021-08-06: 50 ug via INTRAVENOUS
  Filled 2021-08-06: qty 1

## 2021-08-06 NOTE — ED Provider Notes (Signed)
[facility] Emergency Department Provider Note  ____________________________________________   Event Date/Time   First MD Initiated Contact with Patient 08/06/21 2133     (approximate)  I have reviewed the triage vital signs and the nursing notes.   HISTORY  Chief Complaint Vascular Access Problem    HPI Jordan [name] is a 55 y.o. male  with history as below, including ESRD, chronic nonadherence, known infected L AVF with wound, here with multiple complaints. Pt primary complaint is increasing pain, drainage at his L AVF site. This is a known issue and he was scheduled to have this operated on as an outpt, but reportedly "slept in" and also left AMA prior to surgery in the recent past. Reports that he has also had increasing pain along sores on his buttocks and b/l legs, along areas of calciphylaxis. Reports he did go to HD yesterday. Denies any current SOB. Pain is aching, throbbing, worse w/ movement and palpation. No alleviating factors.        Past Medical History:  Diagnosis Date   Anemia    Aortic atherosclerosis ([place]) 11/12/2019   ED (erectile dysfunction)    Emphysema of lung ([place]) 11/12/2019   ESRD on hemodialysis ([place])    M/W/F in [place], [place]   ETOH abuse    History of blood transfusion    Hypertension    Wears dentures     Patient Active Problem List   Diagnosis Date Noted   CAP (community acquired pneumonia) 07/30/2021   ABLA (acute blood loss anemia) 07/17/2021   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 ([place]) 07/17/2021   AV fistula infection, 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/17/2021   Bacteremia 07/17/2021   AV fistula thrombosis, 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/17/2021   Pressure injury of skin 07/15/2021   Acute on chronic respiratory failure with hypoxemia ([place]) 07/12/2021   Ascites    Protein-calorie malnutrition, severe 06/05/2021   Hypervolemia associated with renal insufficiency 06/02/2021   Symptomatic anemia  05/28/2021   Acute on chronic diastolic CHF (congestive heart failure) ([place]) 05/28/2021   Hyperkalemia 05/28/2021   Pulmonary edema 04/29/2021   Pruritus 04/29/2021   COVID-19 virus infection 04/29/2021   Thrombocytopenia ([place]) 04/08/2021   COPD (chronic obstructive pulmonary disease) ([place]) 04/08/2021   CHF (congestive heart failure) ([place]) 03/25/2021   PAF (paroxysmal atrial fibrillation) ([place]) 03/20/2021   Chronic diastolic CHF (congestive heart failure) ([place]) 03/20/2021   Alcohol abuse 03/20/2021   Vision loss of left eye 03/20/2021   SOB (shortness of breath) 03/20/2021   Shortness of breath 03/10/2021   Abscess of buttock 03/02/2021   Fluid overload 03/02/2021   Multifocal pneumonia 01/13/2021   Current mild episode of major depressive disorder without prior episode ([place]) 04/09/2020   Atrial fibrillation with RVR ([place]) 04/04/2020   Non-compliance with renal dialysis ([place]) 03/21/2020   Acute CHF (congestive heart failure) ([place]) 03/12/2020   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 ([place]) 02/03/2020   Acute on chronic combined systolic and diastolic CHF (congestive heart failure) ([place]) 02/03/2020   Acute on chronic respiratory failure with hypoxia ([place]) 11/12/2019   Aortic atherosclerosis ([place]) 11/12/2019   Emphysema of lung ([place]) 11/12/2019   Volume overload 10/05/2019   Elevated troponin 05/17/2018   Anemia in ESRD (end-stage renal disease) ([place]) 05/17/2018   ESRD on dialysis ([place]) 05/26/2017   Essential hypertension 05/26/2017   Moderate protein-calorie malnutrition ([place]) 08/13/2016   Secondary hyperparathyroidism of renal origin ([place]) 08/13/2016   Tobacco abuse 08/04/2016    Past Surgical  History:  Procedure Laterality Date   A/V FISTULAGRAM Left 07/15/2021   Procedure: A/V FISTULAGRAM;  Surgeon: Algernon Huxley, MD;  Location: [place] [facility];  Service: Cardiovascular;  Laterality: Left;   BASCILIC VEIN TRANSPOSITION Left 08/07/2016   Procedure: LEFT  BASILIC VEIN TRANSPOSITION;  Surgeon: Angelia Mould, MD;  Location: [place];  Service: Vascular;  Laterality: Left;   CLOSED REDUCTION NASAL FRACTURE N/A 08/11/2019   Procedure: CLOSED REDUCTION NASAL FRACTURE WITH STABILIZATION;  Surgeon: Irene Limbo, MD;  Location: [facility];  Service: Plastics;  Laterality: N/A;   COLONOSCOPY N/A 08/12/2016   Procedure: COLONOSCOPY;  Surgeon: Otis Brace, MD;  Location: [facility];  Service: Gastroenterology;  Laterality: N/A;   COLONOSCOPY WITH PROPOFOL N/A 06/05/2021   Procedure: COLONOSCOPY WITH PROPOFOL;  Surgeon: Sharyn Creamer, MD;  Location: [place];  Service: Gastroenterology;  Laterality: N/A;   DIALYSIS/PERMA CATHETER INSERTION N/A 07/26/2021   Procedure: DIALYSIS/PERMA CATHETER INSERTION;  Surgeon: Algernon Huxley, MD;  Location: [place] [facility] [facility];  Service: Cardiovascular;  Laterality: N/A;   ESOPHAGOGASTRODUODENOSCOPY N/A 08/12/2016   Procedure: ESOPHAGOGASTRODUODENOSCOPY (EGD);  Surgeon: Otis Brace, MD;  Location: [facility];  Service: Gastroenterology;  Laterality: N/A;   ESOPHAGOGASTRODUODENOSCOPY (EGD) WITH PROPOFOL N/A 06/05/2021   Procedure: ESOPHAGOGASTRODUODENOSCOPY (EGD) WITH PROPOFOL;  Surgeon: Sharyn Creamer, MD;  Location: [facility];  Service: Gastroenterology;  Laterality: N/A;   INSERTION OF DIALYSIS CATHETER N/A 08/07/2016   Procedure: INSERTION OF TUNNELED DIALYSIS CATHETER;  Surgeon: Angelia Mould, MD;  Location: [facility];  Service: Vascular;  Laterality: N/A;   LIGATION OF ARTERIOVENOUS  FISTULA Left 09/12/2016   Procedure: BANDING OF LEFT  ARTERIOVENOUS  FISTULA;  Surgeon: Angelia Mould, MD;  Location: [place];  Service: Vascular;  Laterality: Left;   LIGATION OF ARTERIOVENOUS  FISTULA Left 07/17/2021   Procedure: LIGATION OF ARTERIOVENOUS  FISTULA;  Surgeon: Katha Cabal, MD;  Location: [facility];  Service: Vascular;  Laterality: Left;  also include placement of temporary dialysis  catheter right or left groin   LOWER EXTREMITY INTERVENTION Right 12/02/2018   Procedure: LOWER EXTREMITY INTERVENTION;  Surgeon: Algernon Huxley, MD;  Location: [facility];  Service: Cardiovascular;  Laterality: Right;   POLYPECTOMY  06/05/2021   Procedure: POLYPECTOMY;  Surgeon: Sharyn Creamer, MD;  Location: [facility];  Service: Gastroenterology;;   TEMPORARY DIALYSIS CATHETER Left 07/17/2021   Procedure: TEMPORARY DIALYSIS CATHETER;  Surgeon: Katha Cabal, MD;  Location: [facility];  Service: Vascular;  Laterality: Left;    Prior to Admission medications   Medication Sig Start Date End Date Taking? Authorizing Provider  albuterol (VENTOLIN HFA) 108 (90 Base) MCG/ACT inhaler Inhale 2 puffs into the lungs every 4 (four) hours as needed for wheezing or shortness of breath. 06/05/21   Danford, Suann Larry, MD  calcium acetate (PHOSLO) 667 MG capsule Take 2 capsules (1,334 mg total) by mouth 3 (three) times daily with meals. 06/05/21   Danford, Suann Larry, MD  cinacalcet (SENSIPAR) 30 MG tablet Take 1 tablet (30 mg total) by mouth daily with breakfast. 06/05/21   Danford, Suann Larry, MD  docusate sodium (COLACE) 100 MG capsule Take 1 capsule (100 mg total) by mouth 2 (two) times daily. 06/05/21   Edwin Dada, MD  epoetin alfa (EPOGEN) 4000 UNIT/ML injection Inject 1 mL (4,000 Units total) into the vein every Monday, Wednesday, and Friday with hemodialysis. 06/22/20   Enzo Bi, MD  ferrous sulfate 325 (65 FE) MG tablet Take  1 tablet (325 mg total) by mouth 2 (two) times daily with a meal. 06/05/21   Danford, Suann Larry, MD  metoprolol tartrate (LOPRESSOR) 50 MG tablet Take 1 tablet (50 mg total) by mouth 2 (two) times daily. 07/04/21   Jennye Boroughs, MD  pantoprazole (PROTONIX) 20 MG tablet Take 1 tablet (20 mg total) by mouth 2 (two) times daily before a meal. 06/05/21 08/04/21  Danford, Suann Larry, MD    Allergies Lisinopril  Family History  Problem Relation  Age of Onset   Diabetes Mother    Kidney failure Mother    Healthy Father    Kidney failure Brother    Healthy Sister    Kidney disease Daughter    Post-traumatic stress disorder Neg Hx    Bladder Cancer Neg Hx    Kidney cancer Neg Hx     Social History Social History   Tobacco Use   Smoking status: Every Day    Packs/day: 0.50    Years: 40.00    Pack years: 20.00    Types: Cigarettes   Smokeless tobacco: Never  Vaping Use   Vaping Use: Never used  Substance Use Topics   Alcohol use: Not Currently    Alcohol/week: 21.0 standard drinks    Types: 21 Cans of beer per week    Comment: last drink 6 months ago   Drug use: No    Review of Systems  Review of Systems  Constitutional:  Positive for chills and fatigue. Negative for fever.  HENT:  Negative for sore throat.   Respiratory:  Negative for shortness of breath.   Cardiovascular:  Negative for chest pain.  Gastrointestinal:  Negative for abdominal pain.  Genitourinary:  Negative for flank pain.  Musculoskeletal:  Positive for arthralgias. Negative for neck pain.  Skin:  Positive for wound. Negative for rash.  Allergic/Immunologic: Negative for immunocompromised state.  Neurological:  Positive for weakness. Negative for numbness.  Hematological:  Does not bruise/bleed easily.  All other systems reviewed and are negative.   ____________________________________________  PHYSICAL EXAM:      VITAL SIGNS: ED Triage Vitals  Enc Vitals Group     BP 08/06/21 2124 (!) 144/95     Pulse Rate 08/06/21 2124 (!) 103     Resp 08/06/21 2124 18     Temp 08/06/21 2124 98.3 F (36.8 C)     Temp Source 08/06/21 2124 Oral     SpO2 08/06/21 2124 (!) 84 %     Weight 08/06/21 2124 165 lb (74.8 kg)     Height 08/06/21 2124 5\' 3"  (1.6 m)     Head Circumference --      Peak Flow --      Pain Score 08/06/21 2131 0     Pain Loc --      Pain Edu? --      Excl. in [place]? --      Physical Exam Vitals and nursing note reviewed.   Constitutional:      General: He is not in acute distress.    Appearance: He is well-developed.  HENT:     Head: Normocephalic and atraumatic.     Mouth/Throat:     Mouth: Mucous membranes are moist.  Eyes:     Conjunctiva/sclera: Conjunctivae normal.  Cardiovascular:     Rate and Rhythm: Normal rate and regular rhythm.     Heart sounds: Normal heart sounds.  Pulmonary:     Effort: Pulmonary effort is normal. No respiratory distress.  Breath sounds: No wheezing.  Abdominal:     General: There is no distension.  Musculoskeletal:     Cervical back: Neck supple.  Skin:    General: Skin is warm.     Capillary Refill: Capillary refill takes less than 2 seconds.     Findings: No rash.     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  Neurological:     Mental Status: He is alert and oriented to person, place, and time.     Motor: No abnormal muscle tone.     LUE: Exposed wounds along L AVF, with moderate warmth surrounding area, slight induration, esp along inferior area of AVF. No expressible purulence. No bleeding.  ____________________________________________   LABS (all labs ordered are listed, but only abnormal results are displayed)  Labs Reviewed  CULTURE, BLOOD (ROUTINE X 2)  CULTURE, BLOOD (ROUTINE X 2)  CBC WITH DIFFERENTIAL/PLATELET  COMPREHENSIVE METABOLIC PANEL  LACTIC ACID, PLASMA  LACTIC ACID, PLASMA  SEDIMENTATION RATE  C-REACTIVE PROTEIN    ____________________________________________  EKG:  ________________________________________  RADIOLOGY All imaging, including plain films, CT scans, and ultrasounds, independently reviewed by me, and interpretations confirmed via formal radiology reads.  ED MD interpretation:   CXR: Mild vascular congestion, likely related to fluid overload  Official radiology report(s): DG Chest Portable 1 View  Result Date: 08/06/2021 CLINICAL DATA:  Shortness of breath EXAM: PORTABLE CHEST 1  VIEW COMPARISON:  07/30/2021 FINDINGS: Cardiac shadow is enlarged but stable. Dialysis catheter is again seen and stable. Aortic calcifications are noted. Mild vascular congestion is noted although improved when compared with the prior exam. No bony abnormality is seen. IMPRESSION: Mild vascular congestion likely related to fluid overload. No acute abnormality noted. Electronically Signed   By: Inez Catalina M.D.   On: 08/06/2021 22:01    ____________________________________________  PROCEDURES   Procedure(s) performed (including Critical Care):  Procedures  ____________________________________________  INITIAL IMPRESSION / MDM / [place] / ED COURSE  As part of my medical decision making, I reviewed the following data within the [place] notes reviewed and incorporated, Old chart reviewed, Notes from prior ED visits, and [place] Controlled Substance Database       *HORALD BIRKY was evaluated in Emergency Department on 08/06/2021 for the symptoms described in the history of present illness. He was evaluated in the context of the global COVID-19 pandemic, which necessitated consideration that the patient might be at risk for infection with the SARS-CoV-2 virus that causes COVID-19. Institutional protocols and algorithms that pertain to the evaluation of patients at risk for COVID-19 are in a state of rapid change based on information released by regulatory bodies including the CDC and federal and state organizations. These policies and algorithms were followed during the patient's care in the ED.  Some ED evaluations and interventions may be delayed as a result of limited staffing during the pandemic.*     Medical Decision Making:  55 yo M here with PMHx ESRD here with reported increasing pain, drainage from L AVF site. Pt has a known, infected L AVF for which he is receiving Cefepime with dialysis sessions. Last session was yesterday. Pt does have open,  draining wound on exam with surrounding warmth. Reportedly, he was febrile with EMS and given APAP - T 99 here rectally. Will check labs, cultures, plan to likely admit given known infection and need for debridement/wound vac. He is amenable to this at this time. Fentanyl given for pain.  Signed out to Dr. Alfred Levins while awaiting labs, remainder of work-up.  ____________________________________________  FINAL CLINICAL IMPRESSION(S) / ED DIAGNOSES  Final diagnoses:  Wound dehiscence  Infection of arteriovenous fistula, initial encounter ([place])     MEDICATIONS GIVEN DURING THIS VISIT:  Medications  fentaNYL (SUBLIMAZE) injection 50 mcg (50 mcg Intravenous Given 08/06/21 2315)  hydrOXYzine (ATARAX/VISTARIL) tablet 25 mg (25 mg Oral Given 08/06/21 2315)     ED Discharge Orders     None        Note:  This document was prepared using Dragon voice recognition software and may include unintentional dictation errors.   Duffy Bruce, MD 08/06/21 [phone]

## 2021-08-06 NOTE — ED Notes (Addendum)
Dr. Ellender Hose in room to eval pt, pt states he missed his surgery because he over slept, pt c/o pain on his butt. Pt had dialysis last yesterday. Completed full treatment.  Pt has known infection to L graft. Supposed to have rescheduled surgery on 11/23

## 2021-08-06 NOTE — Telephone Encounter (Signed)
Patient returned my call and is now scheduled with Dr. Delana Meyer for a wound debridement and vac placement on 08/14/21 at the [facility]. Pre-op is on 08/12/21 phone call between 8-1 pm. Pre-surgical instructions were discussed and will be mailed.

## 2021-08-06 NOTE — ED Triage Notes (Signed)
Pt to ED via EMS from home, pt state that he thinks his dialysis access graft is leaking, Pt states that he has itching around the site and throughout his body. Pt denies pain. Pts 02 on room air in the 80s, pt placed on 2L states eh uses it at home intermittently.

## 2021-08-07 DIAGNOSIS — Z9115 Patient's noncompliance with renal dialysis: Secondary | ICD-10-CM | POA: Diagnosis not present

## 2021-08-07 DIAGNOSIS — J439 Emphysema, unspecified: Secondary | ICD-10-CM | POA: Diagnosis present

## 2021-08-07 DIAGNOSIS — Z833 Family history of diabetes mellitus: Secondary | ICD-10-CM | POA: Diagnosis not present

## 2021-08-07 DIAGNOSIS — L89322 Pressure ulcer of left buttock, stage 2: Secondary | ICD-10-CM | POA: Diagnosis present

## 2021-08-07 DIAGNOSIS — Z5329 Procedure and treatment not carried out because of patient's decision for other reasons: Secondary | ICD-10-CM | POA: Diagnosis present

## 2021-08-07 DIAGNOSIS — T827XXA Infection and inflammatory reaction due to other cardiac and vascular devices, implants and grafts, initial encounter: Secondary | ICD-10-CM

## 2021-08-07 DIAGNOSIS — D631 Anemia in chronic kidney disease: Secondary | ICD-10-CM | POA: Diagnosis present

## 2021-08-07 DIAGNOSIS — T8149XA Infection following a procedure, other surgical site, initial encounter: Secondary | ICD-10-CM | POA: Diagnosis not present

## 2021-08-07 DIAGNOSIS — Z992 Dependence on renal dialysis: Secondary | ICD-10-CM

## 2021-08-07 DIAGNOSIS — I480 Paroxysmal atrial fibrillation: Secondary | ICD-10-CM | POA: Diagnosis present

## 2021-08-07 DIAGNOSIS — I700 Atherosclerosis of aorta: Secondary | ICD-10-CM | POA: Diagnosis present

## 2021-08-07 DIAGNOSIS — Z6829 Body mass index (BMI) 29.0-29.9, adult: Secondary | ICD-10-CM | POA: Diagnosis not present

## 2021-08-07 DIAGNOSIS — T8141XA Infection following a procedure, superficial incisional surgical site, initial encounter: Secondary | ICD-10-CM | POA: Diagnosis present

## 2021-08-07 DIAGNOSIS — N186 End stage renal disease: Secondary | ICD-10-CM | POA: Diagnosis present

## 2021-08-07 DIAGNOSIS — T8130XA Disruption of wound, unspecified, initial encounter: Secondary | ICD-10-CM | POA: Diagnosis present

## 2021-08-07 DIAGNOSIS — Z841 Family history of disorders of kidney and ureter: Secondary | ICD-10-CM | POA: Diagnosis not present

## 2021-08-07 DIAGNOSIS — Y832 Surgical operation with anastomosis, bypass or graft as the cause of abnormal reaction of the patient, or of later complication, without mention of misadventure at the time of the procedure: Secondary | ICD-10-CM | POA: Diagnosis present

## 2021-08-07 DIAGNOSIS — I5033 Acute on chronic diastolic (congestive) heart failure: Secondary | ICD-10-CM | POA: Diagnosis present

## 2021-08-07 DIAGNOSIS — A488 Other specified bacterial diseases: Secondary | ICD-10-CM

## 2021-08-07 DIAGNOSIS — I132 Hypertensive heart and chronic kidney disease with heart failure and with stage 5 chronic kidney disease, or end stage renal disease: Secondary | ICD-10-CM | POA: Diagnosis present

## 2021-08-07 DIAGNOSIS — L89312 Pressure ulcer of right buttock, stage 2: Secondary | ICD-10-CM | POA: Diagnosis present

## 2021-08-07 DIAGNOSIS — F17210 Nicotine dependence, cigarettes, uncomplicated: Secondary | ICD-10-CM | POA: Diagnosis present

## 2021-08-07 LAB — BPAM RBC
Blood Product Expiration Date: 202211202359
Blood Product Expiration Date: 202211202359
Blood Product Expiration Date: 202211212359
ISSUE DATE / TIME: 202210261715
Unit Type and Rh: 9500
Unit Type and Rh: 9500
Unit Type and Rh: 9500

## 2021-08-07 LAB — C-REACTIVE PROTEIN: CRP: 1.9 mg/dL — ABNORMAL HIGH (ref <1.0)

## 2021-08-07 LAB — TYPE AND SCREEN
ABO/RH(D): O NEG
Antibody Screen: POSITIVE
Unit division: 0
Unit division: 0
Unit division: 0

## 2021-08-07 LAB — CBC WITH DIFFERENTIAL/PLATELET
Abs Immature Granulocytes: 0.06 10*3/uL (ref 0.00–0.07)
Basophils Absolute: 0.1 10*3/uL (ref 0.0–0.1)
Basophils Relative: 1 %
Eosinophils Absolute: 0.2 10*3/uL (ref 0.0–0.5)
Eosinophils Relative: 2 %
HCT: 21.5 % — ABNORMAL LOW (ref 39.0–52.0)
Hemoglobin: 6.7 g/dL — ABNORMAL LOW (ref 13.0–17.0)
Immature Granulocytes: 1 %
Lymphocytes Relative: 13 %
Lymphs Abs: 0.8 10*3/uL (ref 0.7–4.0)
MCH: 30 pg (ref 26.0–34.0)
MCHC: 31.2 g/dL (ref 30.0–36.0)
MCV: 96.4 fL (ref 80.0–100.0)
Monocytes Absolute: 0.8 10*3/uL (ref 0.1–1.0)
Monocytes Relative: 13 %
Neutro Abs: 4.6 10*3/uL (ref 1.7–7.7)
Neutrophils Relative %: 70 %
Platelets: 168 10*3/uL (ref 150–400)
RBC: 2.23 MIL/uL — ABNORMAL LOW (ref 4.22–5.81)
RDW: 18.1 % — ABNORMAL HIGH (ref 11.5–15.5)
WBC: 6.6 10*3/uL (ref 4.0–10.5)
nRBC: 0 % (ref 0.0–0.2)

## 2021-08-07 LAB — LACTIC ACID, PLASMA: Lactic Acid, Venous: 1.4 mmol/L (ref 0.5–1.9)

## 2021-08-07 LAB — PREPARE RBC (CROSSMATCH)

## 2021-08-07 MED ORDER — CALCIUM ACETATE (PHOS BINDER) 667 MG PO CAPS
1334.0000 mg | ORAL_CAPSULE | Freq: Three times a day (TID) | ORAL | Status: DC
  Administered 2021-08-07: 1334 mg via ORAL
  Filled 2021-08-07 (×3): qty 2

## 2021-08-07 MED ORDER — ACETAMINOPHEN 325 MG PO TABS: 650.0000 mg | ORAL_TABLET | Freq: Four times a day (QID) | ORAL | Status: DC | PRN

## 2021-08-07 MED ORDER — ACETAMINOPHEN 325 MG RE SUPP: 650.0000 mg | Freq: Four times a day (QID) | RECTAL | Status: DC | PRN

## 2021-08-07 MED ORDER — EPOETIN ALFA 10000 UNIT/ML IJ SOLN
10000.0000 [IU] | INTRAMUSCULAR | Status: DC
  Administered 2021-08-07: 10000 [IU] via INTRAVENOUS
  Filled 2021-08-07 (×2): qty 1

## 2021-08-07 MED ORDER — ONDANSETRON HCL 4 MG PO TABS: 4.0000 mg | ORAL_TABLET | Freq: Four times a day (QID) | ORAL | Status: DC | PRN

## 2021-08-07 MED ORDER — SODIUM CHLORIDE 0.9 % IV SOLN
1.0000 g | Freq: Once | INTRAVENOUS | Status: AC
  Administered 2021-08-07: 1 g via INTRAVENOUS
  Filled 2021-08-07: qty 1

## 2021-08-07 MED ORDER — METOPROLOL TARTRATE 50 MG PO TABS: 50.0000 mg | ORAL_TABLET | Freq: Two times a day (BID) | ORAL | Status: DC

## 2021-08-07 MED ORDER — CHLORHEXIDINE GLUCONATE CLOTH 2 % EX PADS
6.0000 | MEDICATED_PAD | Freq: Every day | CUTANEOUS | Status: DC
  Filled 2021-08-07: qty 6

## 2021-08-07 MED ORDER — SODIUM CHLORIDE 0.9% IV SOLUTION
Freq: Once | INTRAVENOUS | Status: DC
  Filled 2021-08-07: qty 250

## 2021-08-07 MED ORDER — PANTOPRAZOLE SODIUM 20 MG PO TBEC
20.0000 mg | DELAYED_RELEASE_TABLET | Freq: Two times a day (BID) | ORAL | Status: DC
  Administered 2021-08-07: 20 mg via ORAL
  Filled 2021-08-07 (×2): qty 1

## 2021-08-07 MED ORDER — CINACALCET HCL 30 MG PO TABS
30.0000 mg | ORAL_TABLET | Freq: Every day | ORAL | Status: DC
  Administered 2021-08-07: 30 mg via ORAL
  Filled 2021-08-07: qty 1

## 2021-08-07 MED ORDER — ALBUTEROL SULFATE HFA 108 (90 BASE) MCG/ACT IN AERS
2.0000 | INHALATION_SPRAY | RESPIRATORY_TRACT | Status: DC | PRN
  Filled 2021-08-07: qty 6.7

## 2021-08-07 MED ORDER — SODIUM CHLORIDE 0.9 % IV SOLN: 1.0000 g | INTRAVENOUS | Status: DC

## 2021-08-07 MED ORDER — HYDROXYZINE HCL 10 MG PO TABS
10.0000 mg | ORAL_TABLET | Freq: Three times a day (TID) | ORAL | Status: DC | PRN
  Administered 2021-08-07: 10 mg via ORAL
  Filled 2021-08-07 (×3): qty 1

## 2021-08-07 MED ORDER — DIPHENHYDRAMINE HCL 50 MG/ML IJ SOLN
INTRAMUSCULAR | Status: AC
  Filled 2021-08-07: qty 1

## 2021-08-07 MED ORDER — ONDANSETRON HCL 4 MG/2ML IJ SOLN: 4.0000 mg | Freq: Four times a day (QID) | INTRAMUSCULAR | Status: DC | PRN

## 2021-08-07 MED ORDER — DIPHENHYDRAMINE HCL 50 MG/ML IJ SOLN
25.0000 mg | Freq: Once | INTRAMUSCULAR | Status: AC
  Administered 2021-08-07: 25 mg via INTRAVENOUS

## 2021-08-07 MED ORDER — HEPARIN SODIUM (PORCINE) 1000 UNIT/ML IJ SOLN
INTRAMUSCULAR | Status: AC
  Filled 2021-08-07: qty 1

## 2021-08-07 MED ORDER — DIPHENHYDRAMINE HCL 25 MG PO CAPS: 25.0000 mg | ORAL_CAPSULE | Freq: Once | ORAL | Status: DC

## 2021-08-07 MED ORDER — HYDRALAZINE HCL 20 MG/ML IJ SOLN: 10.0000 mg | INTRAMUSCULAR | Status: DC | PRN

## 2021-08-07 MED ORDER — DOCUSATE SODIUM 100 MG PO CAPS: 100.0000 mg | ORAL_CAPSULE | Freq: Two times a day (BID) | ORAL | Status: DC

## 2021-08-07 NOTE — ED Notes (Signed)
Pt to dialysis.

## 2021-08-07 NOTE — Progress Notes (Signed)
[facility] Daily Progress Note  Subjective: Seen and examined during dialysis.  Patient denies any discomfort to the left upper extremity.  Patient was on the operating room schedule last week however decided to leave AMA.  Presents today for dialysis.  Objective: Vitals:   08/07/21 1115 08/07/21 1145 08/07/21 1200 08/07/21 1215  BP: (!) 167/112 (!) 163/111 126/85 128/83  Pulse: 97 (!) 104 (!) 104 (!) 103  Resp:      Temp:      TempSrc:      SpO2:  100%    Weight:      Height:       No intake or output data in the 24 hours ending 08/07/21 1253  Physical Exam: A&Ox3, NAD CV: RRR Pulmonary: CTA Bilaterally Abdomen: Soft, Nontender, Nondistended Vascular:  Left Upper Extremity: Wound now with granulation tissue in the area of dehiscence.  No further dehiscence noted as the sutures continue to hold.  No drainage.  Nontender to palpation.  Hand is warm.  Motor/sensory is intact.   Laboratory: CBC    Component Value Date/Time   WBC 6.6 08/07/2021 0706   HGB 6.7 (L) 08/07/2021 0706   HCT 21.5 (L) 08/07/2021 0706   PLT 168 08/07/2021 0706   BMET    Component Value Date/Time   NA 143 08/06/2021 2300   K 4.5 08/06/2021 2300   CL 101 08/06/2021 2300   CO2 27 08/06/2021 2300   GLUCOSE 81 08/06/2021 2300   BUN 51 (H) 08/06/2021 2300   CREATININE 9.76 (H) 08/06/2021 2300   CALCIUM 9.2 08/06/2021 2300   GFRNONAA 6 (L) 08/06/2021 2300   GFRAA 4 (L) 06/20/2020 0414   Assessment/Planning: The patient is a 55 year old male with noncompliance and a known history of end-stage renal disease status post resection of the left upper extremity graft due to infection currently maintained via a PermCath for hemodialysis  1) Left Upper Extremity Wound: Patient was on the operating room schedule last week for wound debridement and possible VAC placement however he left AMA.  Actually on physical exam today the wound looks a lot better with improvement.  There is no  further drainage.  There is now granulation tissue in the area of dehiscence.  No further dehiscence and sutures are still intact.  Nontender to palpation.  There is no vascular compromise to the extremity at this time.  Would favor continued local wound care and will see the patient in one week.  2) ESRD: Continue to dialyze via PermCath for hemodialysis. Once wound has healed we will plan on creation of a new upper extremity access.  Discussed with Dr. Eber Hong Pranika Finks PA-C 08/07/2021 12:53 PM

## 2021-08-07 NOTE — H&P (Signed)
History and Physical    Jordan [name] ZSW:[id number] DOB: 04/16/1966 DOA: 08/06/2021  PCP: [facility], [facility], MD   Patient coming from: home  I have personally briefly reviewed patient's relevant medical records in [place]  Chief Complaint: pain at wound site.  HPI: Jordan [name] is a 55 y.o. 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  The pain is severe and aching and unresolved with home meds.. Patient is currently scheduled for outpatient wound debridement on 11/23 by vascular.  He denies cough, chest pain, shortness of breath.  EMS recorded a temperature of 101 for which he received Tylenol.  He has generalized malaise.  Denies nausea, vomiting or abdominal pain or diarrhea and denies cough and shortness of breath  ED course: By arrival he was afebrile but tachycardic at 101, BP 144/95 Blood work: WBC 7300, hemoglobin 7.4 which is his baseline.  Lactic acid 2.1 and sed rate 52  Chest x-ray with mild vascular congestion likely related to fluid overload  Patient treated with fentanyl for pain.  Hospitalist consulted for admission.    Review of Systems: As per HPI otherwise all other systems on review of systems negative.    Past Medical History:  Diagnosis Date   Anemia    Aortic atherosclerosis ([place]) 11/12/2019   ED (erectile dysfunction)    Emphysema of lung ([place]) 11/12/2019   ESRD on hemodialysis ([place])    M/W/F in [place], [place]   ETOH abuse    History of blood transfusion    Hypertension    Wears  dentures     Past Surgical History:  Procedure Laterality Date   A/V FISTULAGRAM Left 07/15/2021   Procedure: A/V FISTULAGRAM;  Surgeon: Algernon Huxley, MD;  Location: [facility];  Service: Cardiovascular;  Laterality: Left;   BASCILIC VEIN TRANSPOSITION Left 08/07/2016   Procedure: LEFT BASILIC VEIN TRANSPOSITION;  Surgeon: Angelia Mould, MD;  Location: [facility];  Service: Vascular;  Laterality: Left;   CLOSED REDUCTION NASAL FRACTURE N/A 08/11/2019   Procedure: CLOSED REDUCTION NASAL FRACTURE WITH STABILIZATION;  Surgeon: Irene Limbo, MD;  Location: [facility];  Service: Plastics;  Laterality: N/A;   COLONOSCOPY N/A 08/12/2016   Procedure: COLONOSCOPY;  Surgeon: Otis Brace, MD;  Location: [facility];  Service: Gastroenterology;  Laterality: N/A;   COLONOSCOPY WITH PROPOFOL N/A 06/05/2021   Procedure: COLONOSCOPY WITH PROPOFOL;  Surgeon: Sharyn Creamer, MD;  Location: [facility];  Service: Gastroenterology;  Laterality: N/A;   DIALYSIS/PERMA CATHETER INSERTION N/A 07/26/2021   Procedure: DIALYSIS/PERMA CATHETER INSERTION;  Surgeon: Algernon Huxley, MD;  Location: [facility];  Service: Cardiovascular;  Laterality: N/A;   ESOPHAGOGASTRODUODENOSCOPY N/A 08/12/2016   Procedure: ESOPHAGOGASTRODUODENOSCOPY (EGD);  Surgeon: Otis Brace, MD;  Location: [facility];  Service: Gastroenterology;  Laterality: N/A;   ESOPHAGOGASTRODUODENOSCOPY (EGD) WITH PROPOFOL N/A 06/05/2021   Procedure: ESOPHAGOGASTRODUODENOSCOPY (EGD) WITH PROPOFOL;  Surgeon: Sharyn Creamer, MD;  Location: [facility];  Service: Gastroenterology;  Laterality: N/A;   INSERTION OF DIALYSIS CATHETER N/A 08/07/2016   Procedure: INSERTION OF TUNNELED DIALYSIS CATHETER;  Surgeon: Judeth Cornfield  Scot Dock, MD;  Location: [facility];  Service: Vascular;  Laterality: N/A;   LIGATION OF ARTERIOVENOUS  FISTULA Left 09/12/2016   Procedure: BANDING OF LEFT  ARTERIOVENOUS  FISTULA;  Surgeon: Angelia Mould, MD;   Location: [facility];  Service: Vascular;  Laterality: Left;   LIGATION OF ARTERIOVENOUS  FISTULA Left 07/17/2021   Procedure: LIGATION OF ARTERIOVENOUS  FISTULA;  Surgeon: Katha Cabal, MD;  Location: [facility];  Service: Vascular;  Laterality: Left;  also include placement of temporary dialysis catheter right or left groin   LOWER EXTREMITY INTERVENTION Right 12/02/2018   Procedure: LOWER EXTREMITY INTERVENTION;  Surgeon: Algernon Huxley, MD;  Location: [facility];  Service: Cardiovascular;  Laterality: Right;   POLYPECTOMY  06/05/2021   Procedure: POLYPECTOMY;  Surgeon: Sharyn Creamer, MD;  Location: [facility];  Service: Gastroenterology;;   TEMPORARY DIALYSIS CATHETER Left 07/17/2021   Procedure: TEMPORARY DIALYSIS CATHETER;  Surgeon: Katha Cabal, MD;  Location: [facility];  Service: Vascular;  Laterality: Left;     reports that he has been smoking cigarettes. He has a 20.00 pack-year smoking history. He has never used smokeless tobacco. He reports that he does not currently use alcohol after a past usage of about 21.0 standard drinks per week. He reports that he does not use drugs.  Allergies  Allergen Reactions   Lisinopril Swelling    Family History  Problem Relation Age of Onset   Diabetes Mother    Kidney failure Mother    Healthy Father    Kidney failure Brother    Healthy Sister    Kidney disease Daughter    Post-traumatic stress disorder Neg Hx    Bladder Cancer Neg Hx    Kidney cancer Neg Hx       Prior to Admission medications   Medication Sig Start Date End Date Taking? Authorizing Provider  albuterol (VENTOLIN HFA) 108 (90 Base) MCG/ACT inhaler Inhale 2 puffs into the lungs every 4 (four) hours as needed for wheezing or shortness of breath. 06/05/21   Danford, Suann Larry, MD  calcium acetate (PHOSLO) 667 MG capsule Take 2 capsules (1,334 mg total) by mouth 3 (three) times daily with meals. 06/05/21   Danford, Suann Larry, MD  cinacalcet (SENSIPAR)  30 MG tablet Take 1 tablet (30 mg total) by mouth daily with breakfast. 06/05/21   Danford, Suann Larry, MD  docusate sodium (COLACE) 100 MG capsule Take 1 capsule (100 mg total) by mouth 2 (two) times daily. 06/05/21   Edwin Dada, MD  epoetin alfa (EPOGEN) 4000 UNIT/ML injection Inject 1 mL (4,000 Units total) into the vein every Monday, Wednesday, and Friday with hemodialysis. 06/22/20   Enzo Bi, MD  ferrous sulfate 325 (65 FE) MG tablet Take 1 tablet (325 mg total) by mouth 2 (two) times daily with a meal. 06/05/21   Danford, Suann Larry, MD  metoprolol tartrate (LOPRESSOR) 50 MG tablet Take 1 tablet (50 mg total) by mouth 2 (two) times daily. 07/04/21   Jennye Boroughs, MD  pantoprazole (PROTONIX) 20 MG tablet Take 1 tablet (20 mg total) by mouth 2 (two) times daily before a meal. 06/05/21 08/04/21  Edwin Dada, MD    Physical Exam: Vitals:   08/06/21 2132 08/06/21 2138 08/06/21 2312 08/06/21 2330  BP:    (!) 134/95  Pulse:    (!) 103  Resp:    19  Temp:   99.1 F (37.3 C)   TempSrc:   Rectal   SpO2:  96% 98%  99%  Weight:      Height:        Constitutional: Chronically ill-appearing male alert and oriented x 3 . Not in any apparent distress HEENT:      Head: Normocephalic and atraumatic.         Eyes: PERLA, EOMI, Conjunctivae are normal. Sclera is non-icteric.       Mouth/Throat: Mucous membranes are moist.       Neck: Supple with no signs of meningismus. Cardiovascular: Regular rate and rhythm. No murmurs, gallops, or rubs. 2+ symmetrical distal pulses are present . No JVD. No LE edema Respiratory: Respiratory effort normal .Lungs sounds clear bilaterally. No wheezes, crackles, or rhonchi.  Gastrointestinal: Soft, non tender, and non distended with positive bowel sounds.  Genitourinary: No CVA tenderness. Musculoskeletal: Nontender with normal range of motion in all extremities.  LUE: Exposed wounds along L AVF, with moderate warmth surrounding area, . No   purulence. No bleeding Neurologic:  Face is symmetric. Moving all extremities. No gross focal neurologic deficits . Skin: Skin is warm, dry.  Multiple superficial ulcers and excoriation marks seen mostly on extremities psychiatric: Mood and affect are normal  Labs on Admission: I have personally reviewed following labs and imaging studies  CBC: Recent Labs  Lab 07/31/21 0521 08/01/21 0510 08/06/21 2300  WBC 8.9 7.5 7.3  NEUTROABS  --   --  5.3  HGB 7.1* 6.9* 7.4*  HCT 23.9* 23.1* 23.7*  MCV 102.1* 101.3* 99.2  PLT 163 164 599   Basic Metabolic Panel: Recent Labs  Lab 07/31/21 0521 08/01/21 0510 08/06/21 2300  NA 146* 141 143  K 5.4* 4.8 4.5  CL 106 102 101  CO2 28 28 27   GLUCOSE 86 92 81  BUN 59* 36* 51*  CREATININE 10.08* 6.95* 9.76*  CALCIUM 9.1 8.3* 9.2  PHOS  --  5.5*  --    GFR: Estimated Creatinine Clearance: 7.9 mL/min (A) (by C-G formula based on SCr of 9.76 mg/dL (H)). Liver Function Tests: Recent Labs  Lab 08/01/21 0510 08/06/21 2300  AST  --  17  ALT  --  9  ALKPHOS  --  102  BILITOT  --  1.1  PROT  --  7.7  ALBUMIN 2.8* 3.2*   No results for input(s): LIPASE, AMYLASE in the last 168 hours. No results for input(s): AMMONIA in the last 168 hours. Coagulation Profile: No results for input(s): INR, PROTIME in the last 168 hours. Cardiac Enzymes: No results for input(s): CKTOTAL, CKMB, CKMBINDEX, TROPONINI in the last 168 hours. BNP (last 3 results) No results for input(s): PROBNP in the last 8760 hours. HbA1C: No results for input(s): HGBA1C in the last 72 hours. CBG: No results for input(s): GLUCAP in the last 168 hours. Lipid Profile: No results for input(s): CHOL, HDL, LDLCALC, TRIG, CHOLHDL, LDLDIRECT in the last 72 hours. Thyroid Function Tests: No results for input(s): TSH, T4TOTAL, FREET4, T3FREE, THYROIDAB in the last 72 hours. Anemia Panel: No results for input(s): VITAMINB12, FOLATE, FERRITIN, TIBC, IRON, RETICCTPCT in the last 72  hours. Urine analysis:    Component Value Date/Time   COLORURINE YELLOW (A) 01/13/2021 0153   APPEARANCEUR CLEAR (A) 01/13/2021 0153   LABSPEC 1.012 01/13/2021 0153   PHURINE 9.0 (H) 01/13/2021 0153   GLUCOSEU 150 (A) 01/13/2021 0153   HGBUR NEGATIVE 01/13/2021 0153   BILIRUBINUR NEGATIVE 01/13/2021 0153   KETONESUR NEGATIVE 01/13/2021 0153   PROTEINUR >=300 (A) 01/13/2021 0153   NITRITE NEGATIVE 01/13/2021  [place] 01/13/2021 0153    Radiological Exams on Admission: DG Chest Portable 1 View  Result Date: 08/06/2021 CLINICAL DATA:  Shortness of breath EXAM: PORTABLE CHEST 1 VIEW COMPARISON:  07/30/2021 FINDINGS: Cardiac shadow is enlarged but stable. Dialysis catheter is again seen and stable. Aortic calcifications are noted. Mild vascular congestion is noted although improved when compared with the prior exam. No bony abnormality is seen. IMPRESSION: Mild vascular congestion likely related to fluid overload. No acute abnormality noted. Electronically Signed   By: Inez Catalina M.D.   On: 08/06/2021 22:01    Assessment/Plan    Surgical wound infection left upper extremity   S/p removal of infected AV fistula 10/26 -Continue cefepime given presenting blood cultures from 10/26 for Serratia - Vascular consult for debridement and wound VAC (previously scheduled for 11/23) - Wound care - Pain management - We will keep n.p.o. tonight in case of or procedure in the a.m.  Bacteremia due to Serratia 10/26 - Continue cefepime follow repeat blood cultures  Chronic diastolic heart failure with possible mild exacerbation - Chest x-ray with mild vascular congestion likely related to fluid overload - History of missed dialysis but last session was the day prior on 11/15    Essential hypertension - Continue metoprolol    Anemia in ESRD (   ESRD on dialysis ([place]) - Hemoglobin at baseline at 7.4 - Continue to monitor - Nephrology consult for continuation of  dialysis    PAF (paroxysmal atrial fibrillation) ([place]) - Not currently systemic anticoagulation - Has had leading complications from AV fistula in the past - Continue metoprolol for rate control    COPD (chronic obstructive pulmonary disease) ([facility]) - Albuterol as needed  History of noncompliance with medical treatment and leaving AMA discharges - Counseling done on importance of going to the treatment    DVT prophylaxis: SCD Code Status: full code  Family Communication:  none  Disposition Plan: Back to previous home environment Consults called: vascular and nephrology  Status:At the time of admission, it appears that the appropriate admission status for this patient is INPATIENT. This is judged to be reasonable and necessary in order to provide the required intensity of service to ensure the patient's safety given the presenting symptoms, physical exam findings, and initial radiographic and laboratory data in the context of their  Comorbid conditions.   Patient requires inpatient status due to high intensity of service, high risk for further deterioration and high frequency of surveillance required.   I certify that at the point of admission it is my clinical judgment that the patient will require inpatient hospital care spanning beyond [place] MD [facility]   08/07/2021, 12:50 AM

## 2021-08-07 NOTE — ED Notes (Signed)
EDP at bedside  

## 2021-08-07 NOTE — Progress Notes (Signed)
Pharmacy Antibiotic Note  Jordan [name] is a 55 y.o. male w/ ESRD on HD MWF admitted on 08/06/2021 with bacteremia.  Pharmacy has been consulted for Cefepime dosing.  Plan: Ordered Cefepime 1 gm once now. Ordered Cefepime 1 gm q24hr @ 2200.  Pharmacy will continue to follow.  Height: 5\' 3"  (160 cm) Weight: 74.8 kg (165 lb) IBW/kg (Calculated) : 56.9  Temp (24hrs), Avg:98.7 F (37.1 C), Min:98.3 F (36.8 C), Max:99.1 F (37.3 C)  Recent Labs  Lab 07/31/21 0521 08/01/21 0510 08/06/21 2300  WBC 8.9 7.5 7.3  CREATININE 10.08* 6.95* 9.76*  LATICACIDVEN  --   --  2.1*    Estimated Creatinine Clearance: 7.9 mL/min (A) (by C-G formula based on SCr of 9.76 mg/dL (H)).    Allergies  Allergen Reactions   Lisinopril Swelling    Antimicrobials this admission: 11/16 Cefepime >>   Microbiology results: 11/15 BCx: Pending  Thank you for allowing pharmacy to be a part of this patient's care.  Renda Rolls, PharmD, [facility] 08/07/2021 5:10 AM

## 2021-08-07 NOTE — Progress Notes (Signed)
[facility]  ROUNDING NOTE   Subjective:   [name] was admitted to [facility] on 08/06/2021 for Wound infection after surgery [T81.49XA]  Last hemodialysis treatment was Monday. He only completed 2 hours of treatment before signing off.   Objective:  Vital signs in last 24 hours:  Temp:  [98.3 F (36.8 C)-99.1 F (37.3 C)] 99.1 F (37.3 C) (11/15 2312) Pulse Rate:  [70-113] 94 (11/16 1300) Resp:  [18-22] 18 (11/16 0758) BP: (117-176)/(72-118) 119/72 (11/16 1300) SpO2:  [84 %-100 %] 100 % (11/16 1145) Weight:  [74.8 kg] 74.8 kg (11/15 2124)  Weight change:  Filed Weights   08/06/21 2124  Weight: 74.8 kg    Intake/Output: No intake/output data recorded.   Intake/Output this shift:  No intake/output data recorded.  Physical Exam: General: NAD,   Head: Normocephalic, atraumatic. Moist oral mucosal membranes  Eyes: Anicteric, PERRL  Neck: Supple, trachea midline  Lungs:  Clear to auscultation  Heart: Regular rate and rhythm  Abdomen:  Soft, nontender,   Extremities:  Left arm with dressings - not clean  Neurologic: Nonfocal, moving all four extremities  Skin: No lesions  Access: RIJ permcath    Basic Metabolic Panel: Recent Labs  Lab 08/01/21 0510 08/06/21 2300  NA 141 143  K 4.8 4.5  CL 102 101  CO2 28 27  GLUCOSE 92 81  BUN 36* 51*  CREATININE 6.95* 9.76*  CALCIUM 8.3* 9.2  PHOS 5.5*  --     Liver Function Tests: Recent Labs  Lab 08/01/21 0510 08/06/21 2300  AST  --  17  ALT  --  9  ALKPHOS  --  102  BILITOT  --  1.1  PROT  --  7.7  ALBUMIN 2.8* 3.2*   No results for input(s): LIPASE, AMYLASE in the last 168 hours. No results for input(s): AMMONIA in the last 168 hours.  CBC: Recent Labs  Lab 08/01/21 0510 08/06/21 2300 08/07/21 0706  WBC 7.5 7.3 6.6  NEUTROABS  --  5.3 4.6  HGB 6.9* 7.4* 6.7*  HCT 23.1* 23.7* 21.5*  MCV 101.3* 99.2 96.4  PLT 164 195 168    Cardiac Enzymes: No results for input(s): CKTOTAL,  CKMB, CKMBINDEX, TROPONINI in the last 168 hours.  BNP: Invalid input(s): POCBNP  CBG: No results for input(s): GLUCAP in the last 168 hours.  Microbiology: Results for orders placed or performed during the hospital encounter of 08/06/21  Blood culture (routine x 2)     Status: None (Preliminary result)   Collection Time: 08/06/21 11:22 PM   Specimen: BLOOD  Result Value Ref Range Status   Specimen Description BLOOD RIGHT ASSIST CONTROL  Final   Special Requests   Final    BOTTLES DRAWN AEROBIC AND ANAEROBIC Blood Culture adequate volume   Culture   Final    NO GROWTH < 12 HOURS Performed at [facility], [address] [place], [place] [address]    Report Status PENDING  Incomplete  Blood culture (routine x 2)     Status: None (Preliminary result)   Collection Time: 08/06/21 11:22 PM   Specimen: BLOOD  Result Value Ref Range Status   Specimen Description BLOOD LEFT ASSIST CONTROL  Final   Special Requests   Final    BOTTLES DRAWN AEROBIC AND ANAEROBIC Blood Culture adequate volume   Culture   Final    NO GROWTH < 12 HOURS Performed at [facility], [address], [place], [place] [address]  Report Status PENDING  Incomplete    Coagulation Studies: No results for input(s): LABPROT, INR in the last 72 hours.  Urinalysis: No results for input(s): COLORURINE, LABSPEC, PHURINE, GLUCOSEU, HGBUR, BILIRUBINUR, KETONESUR, PROTEINUR, UROBILINOGEN, NITRITE, LEUKOCYTESUR in the last 72 hours.  Invalid input(s): APPERANCEUR    Imaging: DG Chest Portable 1 View  Result Date: 08/06/2021 CLINICAL DATA:  Shortness of breath EXAM: PORTABLE CHEST 1 VIEW COMPARISON:  07/30/2021 FINDINGS: Cardiac shadow is enlarged but stable. Dialysis catheter is again seen and stable. Aortic calcifications are noted. Mild vascular congestion is noted although improved when compared with the prior exam. No bony abnormality is seen. IMPRESSION: Mild vascular congestion likely  related to fluid overload. No acute abnormality noted. Electronically Signed   By: Inez Catalina M.D.   On: 08/06/2021 22:01     Medications:    ceFEPime (MAXIPIME) IV      sodium chloride   Intravenous Once   calcium acetate  1,334 mg Oral TID WC   Chlorhexidine Gluconate Cloth  6 each Topical Q0600   cinacalcet  30 mg Oral Q breakfast   diphenhydrAMINE       docusate sodium  100 mg Oral BID   epoetin (EPOGEN/PROCRIT) injection  10,000 Units Intravenous Q M,W,F-HD   heparin sodium (porcine)       metoprolol tartrate  50 mg Oral BID   pantoprazole  20 mg Oral BID AC   acetaminophen **OR** acetaminophen, albuterol, hydrALAZINE, hydrOXYzine, ondansetron **OR** ondansetron (ZOFRAN) IV  Assessment/ Plan:  [name] is a 55 y.o. black male end stage renal disease on hemodialysis, hypertension, infection of his AVG, alcohol abuse, diastolic chronic heart failure, who is admitted to [facility] on 08/06/2021 for Wound infection after surgery [T81.49XA]  [facility]) [facility] RIJ permcath 67kg  End stage renal disease: on hemodialysis. Emergent hemodialysis treatment this morning for pulmonary edema and hypertension. Patient told to comply with his dialysis prescription.   Anemia with chronic kidney disease: hemoglobin 6.7. Patient may benefit from PRBC transfusion. EPO with HD treatments.   Complication of dialysis device with Fever. Pending blood cultures.  - Empiric antibiotics: cefepime  - Appreciate vascular input.   Hypertension: improved with hemodialysis treatment.   - restart metoprolol    LOS: 0 Torsten Weniger 11/16/20222:33 PM

## 2021-08-07 NOTE — ED Notes (Signed)
Pt still has IV in place but removed monitoring leads and is sitting on bed with jacket on. EDP talked with pt and hospitalist will come talk with pt about staying overnight. Called lab to come draw labs.

## 2021-08-07 NOTE — ED Notes (Signed)
Apple juice, graham crackers and peanut butter given at this time per pt request.

## 2021-08-07 NOTE — Progress Notes (Signed)
Pt. tolerated treatment. CVC functioned well, maintained prescribed BFR, targeted UF met 2.5 liter removed. Pt. uncooperative with care, removing dressing to left arm, yelling , and kicking. Pt. Encouraged to attend dialysis sessions at dialysis center.

## 2021-08-07 NOTE — ED Notes (Signed)
Pt back from dialysis. According to nurse sitting near pt room, pt removed IV and took all monitoring leads off and stated he was going to leave. Informed CN and ED provider. This nurse and preceptor currently with other critical pt.

## 2021-08-07 NOTE — ED Notes (Addendum)
Pt at nurses station asking how he can get out of here. Pt stating he is being discharged. This RN explaining to pt that this RN is not aware of pt situation due to not being primary RN. RN able to direct pt back in room where monitoring cords, IV fluids laying on the floor but IV still in place. Pt refusing to get back in bed at this time. Pt primary RN and MD aware.

## 2021-08-07 NOTE — ED Notes (Signed)
Pt refuses to stay to allow hospitalist to talk to him about staying overnight for blood culture results. Pt allowed nurse to obtain VS and remove IV. Pt walked out with steady gait.  ED provider aware.

## 2021-08-07 NOTE — ED Notes (Signed)
Spoke with Lorriane Shire, dialysis RN. They will come for pt shortly.

## 2021-08-07 NOTE — Progress Notes (Signed)
PROGRESS NOTE    Jordan [name]  PPI:[id number] DOB: [birth year] DOA: 08/06/2021 PCP: [facility], MD   Brief Narrative:  HPI: Jordan [name] is a 55 y.o. 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  The pain is severe and aching and unresolved with home meds.. Patient is currently scheduled for outpatient wound debridement on 11/23 by vascular.  He denies cough, chest pain, shortness of breath.  EMS recorded a temperature of 101 for which he received Tylenol.  He has generalized malaise.  Denies nausea, vomiting or abdominal pain or diarrhea and denies cough and shortness of breath   ED course: By arrival he was afebrile but tachycardic at 101, BP 144/95 Blood work: WBC 7300, hemoglobin 7.4 which is his baseline.  Lactic acid 2.1 and sed rate 52   Chest x-ray with mild vascular congestion likely related to fluid overload   Patient treated with fentanyl for pain.  Hospitalist consulted for admission.     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  Continue wound care and pain management.  He is n.p.o. for potential procedure today.  I have sent secure chat message to Dr. Lucky Cowboy as well to confirm that he is aware of the consult as mentioned in H&P.  Chronic diastolic heart failure with possible mild exacerbation/ESRD on HD - Chest x-ray with mild vascular congestion likely related to fluid overload - History of missed dialysis but last session was on 11/15.  Nephrology consulted.  I have also sent secure chat message to Dr. Juleen China to confirm that he is aware of the consult that was placed by admitting hospitalist.     Essential hypertension Blood pressure elevated, continue metoprolol, add as needed hydralazine.   Anemia of chronic disease: Hemoglobin down to 6.7.  Will transfuse 1 unit of PRBC.    PAF (paroxysmal atrial fibrillation) (HCC) - Not currently on systemic anticoagulation - Has had leading complications from AV fistula in the past - Continue metoprolol for rate control     COPD (chronic obstructive pulmonary disease) (HCC) - Albuterol as needed   History of noncompliance with medical treatment and leaving AMA discharges - Counseling done on importance of going to the treatment   Behavioral issues: Patient very grumpy and upset for unknown reasons.  He would not talk to me straight.  To any question I would ask, he would just talk about his itching and how bad the hospital is.  Tells me that he has been having itching for 30 minutes  and has called the nurse but he has not received any response.  He is not coming up with any specific symptoms for me to deal with.  I tried to reassure him that in order to treat his symptoms, we need to ask more history.  The more I tried to asked the history, the more upset he became.  At the end, he stopped talking to me.  I  counseled him how important it is for [place] to know his symptoms and for him to get the treatment and not to leave [place] this time as he has done in the past.   DVT prophylaxis: SCDs Start: 08/07/21 0445   Code Status: Full Code  Family Communication:  None present at bedside.    Status is: Inpatient  Remains inpatient appropriate because: Needs further medical management of his acute issues  Estimated body mass index is 29.23 kg/m as calculated from the following:   Height as of this encounter: 5\' 3"  (1.6 m).   Weight as of this encounter: 74.8 kg.  Pressure Injury 07/14/21 Buttocks Mid Stage 2 -  Partial thickness loss of dermis presenting as a shallow open injury with a red, pink wound bed without slough. open area (Active)  07/14/21 1402  Location: Buttocks  Location Orientation: Mid  Staging: Stage 2 -  Partial thickness loss of dermis presenting as a shallow open injury with a red, pink wound bed without slough.  Wound Description (Comments): open area  Present on Admission: Yes    Nutritional Assessment: Body mass index is 29.23 kg/m.Marland Kitchen Seen by dietician.  I agree with the assessment and plan as outlined below: Nutrition Status:        .  Skin Assessment: I have examined the patient's skin and I agree with the wound assessment as performed by the wound care RN as outlined below: Pressure Injury 07/14/21 Buttocks Mid Stage 2 -  Partial thickness loss of dermis presenting as a shallow open injury with a red, pink wound bed without slough. open area (Active)  07/14/21 1402  Location: Buttocks  Location Orientation: Mid  Staging: Stage 2 -  Partial thickness loss of dermis presenting as a shallow open injury with a red, pink wound bed without slough.  Wound Description (Comments): open area  Present on Admission: Yes    Consultants:  Nephrology Vascular  Procedures:  None  Antimicrobials:  Anti-infectives (From admission, onward)    Start      Dose/Rate Route Frequency Ordered Stop   08/07/21 2200  ceFEPIme (MAXIPIME) 1 g in sodium chloride 0.9 % 100 mL IVPB        1 g 200 mL/hr over 30 Minutes Intravenous Every 24 hours 08/07/21 0506     08/07/21 0515  ceFEPIme (MAXIPIME) 1 g in sodium chloride 0.9 % 100 mL IVPB        1 g 200 mL/hr over 30 Minutes Intravenous  Once 08/07/21 0506 08/07/21 0820          Subjective: Patient seen and examined.  He complains of itching.  Nothing else as mentioned above.  Very upset.  Objective: Vitals:   08/07/21 0930 08/07/21 0945 08/07/21 1000 08/07/21 1015  BP: (!) 156/104 (!) 155/109 (!) 158/110 (!) 176/104  Pulse: (!) 103 90 70 99  Resp:      Temp:      TempSrc:      SpO2: 100%  98%   Weight:      Height:  No intake or output data in the 24 hours ending 08/07/21 1055 Filed Weights   08/06/21 2124  Weight: 74.8 kg    Examination:  General exam: Appears upset Respiratory system: Faint crackles at bases bilaterally.  Poor inspiratory effort. Cardiovascular system: S1 & S2 heard, RRR. No JVD, murmurs, rubs, gallops or clicks.  Trace pitting edema bilateral lower extremity. Gastrointestinal system: Abdomen is nondistended, soft and nontender. No organomegaly or masses felt. Normal bowel sounds heard. Central nervous system: Alert and oriented. No focal neurological deficits. Extremities: Symmetric 5 x 5 power. Skin: No rashes, lesions or ulcers Psychiatry: Judgement and insight appear poor.   Data Reviewed: I have personally reviewed following labs and imaging studies  CBC: Recent Labs  Lab 08/01/21 0510 08/06/21 2300 08/07/21 0706  WBC 7.5 7.3 6.6  NEUTROABS  --  5.3 4.6  HGB 6.9* 7.4* 6.7*  HCT 23.1* 23.7* 21.5*  MCV 101.3* 99.2 96.4  PLT 164 195 637   Basic Metabolic Panel: Recent Labs  Lab 08/01/21 0510 08/06/21 2300  NA 141 143  K 4.8 4.5  CL 102 101  CO2 28 27  GLUCOSE 92 81  BUN 36* 51*  CREATININE 6.95* 9.76*  CALCIUM 8.3* 9.2  PHOS 5.5*   --    GFR: Estimated Creatinine Clearance: 7.9 mL/min (A) (by C-G formula based on SCr of 9.76 mg/dL (H)). Liver Function Tests: Recent Labs  Lab 08/01/21 0510 08/06/21 2300  AST  --  17  ALT  --  9  ALKPHOS  --  102  BILITOT  --  1.1  PROT  --  7.7  ALBUMIN 2.8* 3.2*   No results for input(s): LIPASE, AMYLASE in the last 168 hours. No results for input(s): AMMONIA in the last 168 hours. Coagulation Profile: No results for input(s): INR, PROTIME in the last 168 hours. Cardiac Enzymes: No results for input(s): CKTOTAL, CKMB, CKMBINDEX, TROPONINI in the last 168 hours. BNP (last 3 results) No results for input(s): PROBNP in the last 8760 hours. HbA1C: No results for input(s): HGBA1C in the last 72 hours. CBG: No results for input(s): GLUCAP in the last 168 hours. Lipid Profile: No results for input(s): CHOL, HDL, LDLCALC, TRIG, CHOLHDL, LDLDIRECT in the last 72 hours. Thyroid Function Tests: No results for input(s): TSH, T4TOTAL, FREET4, T3FREE, THYROIDAB in the last 72 hours. Anemia Panel: No results for input(s): VITAMINB12, FOLATE, FERRITIN, TIBC, IRON, RETICCTPCT in the last 72 hours. Sepsis Labs: Recent Labs  Lab 08/06/21 2300 08/07/21 0706  LATICACIDVEN 2.1* 1.4    Recent Results (from the past 240 hour(s))  Resp Panel by RT-PCR (Flu A&B, Covid) Nasopharyngeal Swab     Status: None   Collection Time: 07/30/21  1:08 PM   Specimen: Nasopharyngeal Swab; Nasopharyngeal(NP) swabs in vial transport medium  Result Value Ref Range Status   SARS Coronavirus 2 by RT PCR NEGATIVE NEGATIVE Final    Comment: (NOTE) SARS-CoV-2 target nucleic acids are NOT DETECTED.  The SARS-CoV-2 RNA is generally detectable in upper respiratory specimens during the acute phase of infection. The lowest concentration of SARS-CoV-2 viral copies this assay can detect is 138 copies/mL. A negative result does not preclude SARS-Cov-2 infection and should not be used as the sole basis for  treatment or other patient management decisions. A negative result may occur with  improper specimen collection/handling, submission of specimen other than nasopharyngeal swab, presence of viral mutation(s) within the areas targeted by this assay, and inadequate number of viral copies(<138 copies/mL). A negative  result must be combined with clinical observations, patient history, and epidemiological information. The expected result is Negative.  Fact Sheet for Patients:  EntrepreneurPulse.com.au  Fact Sheet for Healthcare Providers:  IncredibleEmployment.be  This test is no t yet approved or cleared by the [place] FDA and  has been authorized for detection and/or diagnosis of SARS-CoV-2 by FDA under an Emergency Use Authorization (EUA). This EUA will remain  in effect (meaning this test can be used) for the duration of the COVID-19 declaration under Section 564(b)(1) of the Act, 21 U.S.C.section 360bbb-3(b)(1), unless the authorization is terminated  or revoked sooner.       Influenza A by PCR NEGATIVE NEGATIVE Final   Influenza B by PCR NEGATIVE NEGATIVE Final    Comment: (NOTE) The Xpert Xpress SARS-CoV-2/FLU/RSV plus assay is intended as an aid in the diagnosis of influenza from Nasopharyngeal swab specimens and should not be used as a sole basis for treatment. Nasal washings and aspirates are unacceptable for Xpert Xpress SARS-CoV-2/FLU/RSV testing.  Fact Sheet for Patients: EntrepreneurPulse.com.au  Fact Sheet for Healthcare Providers: IncredibleEmployment.be  This test is not yet approved or cleared by the [place] FDA and has been authorized for detection and/or diagnosis of SARS-CoV-2 by FDA under an Emergency Use Authorization (EUA). This EUA will remain in effect (meaning this test can be used) for the duration of the COVID-19 declaration under Section 564(b)(1) of the Act, 21  U.S.C. section 360bbb-3(b)(1), unless the authorization is terminated or revoked.  Performed at [facility], [address] [place], [place] [address]   Aerobic culture     Status: None (Preliminary result)   Collection Time: 08/02/21 11:08 AM  Result Value Ref Range Status   Aerobic Bacterial Culture WILL FOLLOW  Preliminary  Blood culture (routine x 2)     Status: None (Preliminary result)   Collection Time: 08/06/21 11:22 PM   Specimen: BLOOD  Result Value Ref Range Status   Specimen Description BLOOD RIGHT ASSIST CONTROL  Final   Special Requests   Final    BOTTLES DRAWN AEROBIC AND ANAEROBIC Blood Culture adequate volume   Culture   Final    NO GROWTH < 12 HOURS Performed at [facility], [address], [place], [place] [address]    Report Status PENDING  Incomplete  Blood culture (routine x 2)     Status: None (Preliminary result)   Collection Time: 08/06/21 11:22 PM   Specimen: BLOOD  Result Value Ref Range Status   Specimen Description BLOOD LEFT ASSIST CONTROL  Final   Special Requests   Final    BOTTLES DRAWN AEROBIC AND ANAEROBIC Blood Culture adequate volume   Culture   Final    NO GROWTH < 12 HOURS Performed at [facility], [address] [place], [place] [address]    Report Status PENDING  Incomplete      Radiology Studies: DG Chest Portable 1 View  Result Date: 08/06/2021 CLINICAL DATA:  Shortness of breath EXAM: PORTABLE CHEST 1 VIEW COMPARISON:  07/30/2021 FINDINGS: Cardiac shadow is enlarged but stable. Dialysis catheter is again seen and stable. Aortic calcifications are noted. Mild vascular congestion is noted although improved when compared with the prior exam. No bony abnormality is seen. IMPRESSION: Mild vascular congestion likely related to fluid overload. No acute abnormality noted. Electronically Signed   By: Inez Catalina M.D.   On: 08/06/2021 22:01    Scheduled Meds:  calcium acetate  1,334 mg Oral TID WC  Chlorhexidine Gluconate Cloth  6 each Topical Q0600   cinacalcet  30 mg Oral Q breakfast   diphenhydrAMINE       docusate sodium  100 mg Oral BID   epoetin (EPOGEN/PROCRIT) injection  10,000 Units Intravenous Q M,W,F-HD   heparin sodium (porcine)       metoprolol tartrate  50 mg Oral BID   pantoprazole  20 mg Oral BID AC   Continuous Infusions:  ceFEPime (MAXIPIME) IV       LOS: 0 days   Time spent: 35 minutes   Darliss Cheney, MD [facility]  08/07/2021, 10:55 AM  Please page via [facility] and do not message via secure chat for anything urgent. Secure chat can be used for anything non urgent.  How to contact the [facility] Attending or Consulting provider [place] or covering provider during after hours Startup, for this patient?  Check the care team in [facility] and look for a) attending/consulting TR[facility] provider listed and b) the [facility] team listed. Page or secure chat [phone]. Log into [email or web address] and use [place]'s universal password to access. If you do not have the password, please contact the hospital operator. Locate the [facility] provider you are looking for under [facility] and page to a number that you can be directly reached. If you still have difficulty reaching the provider, please page the [facility] (Director on Call) for the Hospitalists listed on amion for assistance.

## 2021-08-08 LAB — AEROBIC CULTURE

## 2021-08-08 LAB — SPECIMEN STATUS REPORT

## 2021-08-08 SURGERY — DEBRIDEMENT, WOUND
Anesthesia: General | Laterality: Left

## 2021-08-08 NOTE — Discharge Summary (Signed)
Physician Discharge Summary  Jordan [name] WUJ:[id number] DOB: [birth year] DOA: 08/06/2021  PCP: Hoy Register, MD  Admit date: 08/06/2021 Discharge date: 08/07/2021 30 Day Unplanned Readmission Risk Score    Flowsheet Row ED from 08/06/2021 in [facility]  30 Day Unplanned Readmission Risk Score (%) 74.91 Filed at 08/07/2021 1200       This score is the patient's risk of an unplanned readmission within 30 days of being discharged (0 -100%). The score is based on dignosis, age, lab data, medications, orders, and past utilization.   Low:  0-14.9   Medium: 15-21.9   High: 22-29.9   Extreme: 30 and above          Admitted From: Home Disposition: Left AGAINST MEDICAL ADVICE.  Recommendations for Outpatient Follow-up:  Follow up with PCP in 1-2 weeks Please obtain BMP/CBC in one week Please follow up with your PCP on the following pending results: Unresulted Labs (From admission, onward)    None       Discharge Condition: Unknown, since I was not even informed about patient's decision to leave [place].  Subjective: Patient was seen and examined earlier in the morning on 08/07/2021.  Patient was aggressive as mentioned in my progress note.  I had a lengthy discussion with him and counseled not to leave [place] at this time as he has done in the past.  However, later on he decided to leave [place].  Interestingly, I was not been informed about patient's decision to leave [place].  I found out about this this morning when I reviewed patient's chart.  Following HPI is copied from my colleague admitting hospitalist Dr. Josefine Class H&P. HPI: Jordan [name] is a 55 y.o. male with medical history significant for ESRD on HD MWF, COPD, depression, history of noncompliance with dialysis, frequent hospitalizations for fluid overload and recent AMA discharges, with 2 recent  hospitalizations for AV graft infection and Serratia bacteremia s/p removal of infected AV graft on 10/26, receiving cefepime and dialysis, last dialyzed on 11/15 who presents to the ED, with a complaint of open nonhealing wound of site of fistula removal associated with pain, redness and purulent drainage.  The pain is severe and aching and unresolved with home meds.. Patient is currently scheduled for outpatient wound debridement on 11/23 by vascular.  He denies cough, chest pain, shortness of breath.  EMS recorded a temperature of 101 for which he received Tylenol.  He has generalized malaise.  Denies nausea, vomiting or abdominal pain or diarrhea and denies cough and shortness of breath   ED course: By arrival he was afebrile but tachycardic at 101, BP 144/95 Blood work: WBC 7300, hemoglobin 7.4 which is his baseline.  Lactic acid 2.1 and sed rate 52   Chest x-ray with mild vascular congestion likely related to fluid overload   Patient treated with fentanyl for pain.  Hospitalist consulted for admission.     Brief/Interim Summary: Patient was admitted due to presumed infected left upper extremity wound, previous site of AV fistula.  Detailed hospitalization course as below.   Surgical wound infection left upper extremity/ S/p removal of infected AV fistula 10/26/bacteremia due to Serratia Patient was continued on cefepime due to positive blood cultures from 10/26 for Serratia.  He was previously scheduled for debridement 11/23.  Vascular was reconsulted.  They assessed him.  They opined that his mood and affect looked better than before so they did not recommend any  intervention during inpatient.  They recommended follow-up in a week as outpatient.  Chronic diastolic heart failure with possible mild exacerbation/ESRD on HD - Chest x-ray with mild vascular congestion likely related to fluid overload - History of missed dialysis but last session was on 11/15.  Nephrology consulted.  Patient  underwent dialysis.     Essential hypertension Blood pressure elevated, all home medications were continued.   Anemia of chronic disease: Hemoglobin down to 6.7.  1 unit of PRBC was ordered for transfusion however patient left AGAINST MEDICAL ADVICE before transfusion was started.     PAF (paroxysmal atrial fibrillation) (HCC) - Not currently on systemic anticoagulation - Has had leading complications from AV fistula in the past - Continue metoprolol for rate control     COPD (chronic obstructive pulmonary disease) (HCC) - Albuterol as needed   History of noncompliance with medical treatment and leaving AMA discharges  Behavioral issues: Patient was very grumpy and upset for unknown reasons.  He would not talk to me straight.  To any question I would ask, he would just talk about his itching and how bad the hospital is.  Tells me that he has been having itching for 30 minutes and has called the nurse but he has not received any response.  He did not up with any specific symptoms for me to deal with.  I tried to reassure him that in order to treat his symptoms, we need to ask more history.  The more I tried to ask the history, the more upset he became.  At the end, he stopped talking to me.  I counseled him how important it is for [place] to know his symptoms and for him to get the treatment and not to leave [place] this time as he has done in the past.  Nonetheless, he ended up leaving [place].  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   calcium acetate 667 MG capsule Commonly known as: PHOSLO Take 2 capsules (1,334 mg total) by mouth 3 (three) times daily with meals.   cinacalcet 30 MG tablet Commonly known as: SENSIPAR Take 1 tablet (30 mg total) by mouth daily with breakfast.   docusate sodium 100 MG capsule Commonly known as: COLACE Take 1 capsule (100 mg total) by mouth 2 (two) times daily.   epoetin alfa 4000 UNIT/ML injection Commonly known as: EPOGEN Inject 1 mL (4,000 Units total) into the vein every Monday, Wednesday, and Friday with hemodialysis.   ferrous sulfate 325 (65 FE) MG tablet Take 1 tablet (325 mg total) by mouth 2 (two) times daily with a meal.   metoprolol tartrate 50 MG tablet Commonly known as: LOPRESSOR Take 1 tablet (50 mg total) by  mouth 2 (two) times daily.   pantoprazole 20 MG tablet Commonly known as: Protonix Take 1 tablet (20 mg total) by mouth 2 (two) times daily before a meal.        Follow-up Information     Kris Hartmann, NP Follow up in 1 week(s).   Specialty: Vascular Surgery Why: For wound check.  No studies. Contact information: [place] [address] [phone]                Allergies  Allergen Reactions   Lisinopril Swelling    Consultations: Vascular surgery and nephrology   Procedures/Studies: PERIPHERAL VASCULAR CATHETERIZATION  Result Date: 07/26/2021 See surgical note for result.  PERIPHERAL VASCULAR CATHETERIZATION  Result Date: 07/15/2021 See surgical note for result.  [place] Upper Ext Art Left  Result Date: 07/12/2021 CLINICAL DATA:  Left upper extremity AV fistula for hemodialysis access. Absent rule with tenderness. Evaluate for  thrombosis EXAM: LEFT UPPER EXTREMITY ARTERIAL DUPLEX SCAN TECHNIQUE: Gray-scale sonography as well as color Doppler and duplex ultrasound was performed to evaluate the hemodialysis AV fistula. COMPARISON:  None. FINDINGS: Targeted interrogation of the patient's arteriovenous fistula at the elbow. The fistula is thrombosed with near occlusive clot. There is no internal blood flow. No focal fluid collection is seen. The remainder of the arteries in the left upper extremity were not interrogated. IMPRESSION: Thrombosed AV fistula in the left arm. These results were called by telephone at the time of interpretation on 07/12/2021 at 7:40 pm to provider PHILLIP STAFFORD , who verbally acknowledged these results. Electronically Signed   By: Keith Rake M.D.   On: 07/12/2021 19:41   DG Chest Portable 1 View  Result Date: 08/06/2021 CLINICAL DATA:  Shortness of breath EXAM: PORTABLE CHEST 1 VIEW COMPARISON:  07/30/2021 FINDINGS: Cardiac shadow is enlarged but stable. Dialysis catheter is again seen and stable. Aortic calcifications are noted. Mild vascular congestion is noted although improved when compared with the prior exam. No bony abnormality is seen. IMPRESSION: Mild vascular congestion likely related to fluid overload. No acute abnormality noted. Electronically Signed   By: Inez Catalina M.D.   On: 08/06/2021 22:01   DG Chest Portable 1 View  Addendum Date: 07/30/2021   ADDENDUM REPORT: 07/30/2021 15:31 ADDENDUM: ADDENDUM PLEASE NOTE INCORRECT DICTATION PRIOR. CORRECT COMPARISON: Chest x-ray 07/23/2021 CORRECT FINDINGS: CORRECT FINDINGS Right chest wall dialysis catheter with tip overlying the right atrium. Cardiomegaly. The heart and mediastinal contours are unchanged. Aortic calcification. No diffuse patchy airspace and interstitial opacities. No pleural effusion. No pneumothorax. No acute osseous abnormality. CORRECT IMPRESSION: Cardiomegaly with pulmonary edema. No pleural effusions. Underlying  pericardial effusion is not excluded. Electronically Signed   By: Iven Finn M.D.   On: 07/30/2021 15:31   Result Date: 07/30/2021 CLINICAL DATA:  Chest pain EXAM: PORTABLE CHEST 1 VIEW COMPARISON:  None. FINDINGS: The heart and mediastinal contours are within normal limits. Vascular clips in sternotomy wires overlie the mediastinum. No focal consolidation. No pulmonary edema. No pleural effusion. No pneumothorax. No acute osseous abnormality. IMPRESSION: No active disease. Electronically Signed: By: Iven Finn M.D. On: 07/30/2021 15:25   DG Chest Portable 1 View  Result Date: 07/23/2021 CLINICAL DATA:  55 year old male status post dialysis fistula resection yesterday, wound seeping blood. EXAM: PORTABLE CHEST 1 VIEW COMPARISON:  Portable chest 07/20/2021 and earlier. FINDINGS: Portable AP upright view at 0524 hours. Left IJ dialysis catheter removed. Stable cardiomegaly. Calcified aortic atherosclerosis. Visualized tracheal air column is within normal limits. Somewhat low  lung volumes with decreased pulmonary vascularity since last month. No overt edema. No pneumothorax, pleural effusion or consolidation. No acute osseous abnormality identified. Negative visible bowel gas. IMPRESSION: 1. Left IJ dialysis catheter removed. 2. Cardiomegaly with interval regression of pulmonary vascular congestion. No overt edema. 3.  Aortic Atherosclerosis (ICD10-I70.0). Electronically Signed   By: Genevie Ann M.D.   On: 07/23/2021 06:10   DG Chest Port 1 View  Result Date: 07/20/2021 CLINICAL DATA:  Central line placement. EXAM: PORTABLE CHEST 1 VIEW COMPARISON:  Radiograph 07/17/2021.  CT 05/03/2021 FINDINGS: Interval extubation. Left internal jugular central venous catheter tip projects over the upper SVC. Unchanged enlarged cardiopericardial silhouette. Advanced aortic atherosclerosis. Development of pulmonary edema from prior exam. There may be trace left pleural effusion. No pneumothorax. IMPRESSION: 1. Left  internal jugular central venous catheter with tip over the upper SVC. No pneumothorax. 2. Unchanged cardiomegaly/enlargement of the cardiopericardial silhouette. Development of pulmonary edema from prior exam. Electronically Signed   By: Keith Rake M.D.   On: 07/20/2021 15:50   DG Chest Port 1 View  Result Date: 07/17/2021 CLINICAL DATA:  Respiratory failure EXAM: PORTABLE CHEST 1 VIEW COMPARISON:  Chest radiograph dated 07/17/2021. FINDINGS: Endotracheal tube with tip approximately 2.5 cm above the carina. There is cardiomegaly with vascular congestion. No focal consolidation, pleural effusion or pneumothorax. Atherosclerotic calcification of the aorta. No acute osseous pathology. IMPRESSION: Cardiomegaly with vascular congestion. No focal consolidation. Electronically Signed   By: Anner Crete M.D.   On: 07/17/2021 20:47   DG Chest Port 1 View  Result Date: 07/17/2021 CLINICAL DATA:  Bleeding of left arm dialysis fistula. EXAM: PORTABLE CHEST 1 VIEW COMPARISON:  July 15, 2021 FINDINGS: Mild, stable diffusely increased interstitial lung markings are seen. There is no evidence of a pleural effusion or pneumothorax. The cardiac silhouette is markedly enlarged and unchanged in size. Marked severity calcification of the thoracic aorta is noted. The visualized skeletal structures are unremarkable. IMPRESSION: 1. Stable cardiomegaly. 2. Mild, stable, diffusely increased interstitial lung markings, which are likely, in part, chronic in nature. A mild superimposed component of interstitial edema cannot be excluded. Electronically Signed   By: Virgina Norfolk M.D.   On: 07/17/2021 00:42   DG Chest Port 1 View  Result Date: 07/15/2021 CLINICAL DATA:  Chest pain EXAM: PORTABLE CHEST 1 VIEW COMPARISON:  07/12/2021, CT 05/03/2021, chest x-ray 06/14/2021, 05/28/2021, 03/20/2021, 10/01/2019 FINDINGS: Cardiomegaly with globular cardiac configuration. Aortic atherosclerosis. Diffuse bilateral  ground-glass opacity. No pleural effusion or pneumothorax. IMPRESSION: 1. Cardiomegaly with globular cardiac configuration which may be due to combination of chamber enlargement and pericardial effusion. 2. Diffuse bilateral ground-glass opacity, suspect underlying component of chronic lung disease but difficult to exclude mild superimposed edema Electronically Signed   By: Donavan Foil M.D.   On: 07/15/2021 16:03   DG Chest Portable 1 View  Result Date: 07/12/2021 CLINICAL DATA:  Shortness of breath x2 days. EXAM: PORTABLE CHEST 1 VIEW COMPARISON:  July 02, 2021 (8:32 a.m.) FINDINGS: Low lung volumes are seen. Mild, diffusely increased interstitial lung markings are noted. This is stable in appearance when compared to the prior study. There is no evidence of a pleural effusion or pneumothorax. The cardiac silhouette is markedly enlarged. There is marked severity calcification of the thoracic aorta. The visualized skeletal structures are unremarkable. IMPRESSION: 1. Stable cardiomegaly with mild pulmonary edema. Electronically Signed   By: Virgina Norfolk M.D.   On: 07/12/2021 19:20   ECHOCARDIOGRAM COMPLETE  Result Date: 07/20/2021  ECHOCARDIOGRAM REPORT   Patient Name:   BAYLEY HURN Date of Exam: 07/20/2021 Medical Rec #:  [id number]        Height:       64.0 in Accession #:    [id number]       Weight:       168.2 lb Date of Birth:  July 13, 1966        BSA:          1.818 m Patient Age:    67 years         BP:           117/74 mmHg Patient Gender: M                HR:           59 bpm. Exam Location:  [facility] Procedure: 2D Echo, Cardiac Doppler and Color Doppler Indications:     Bacteremia R78.81  History:         Patient has prior history of Echocardiogram examinations. Risk                  Factors:Hypertension.  Sonographer:     Alyse Low Roar Referring Phys:  [place] Diagnosing Phys: Skeet Latch MD IMPRESSIONS  1. Left ventricular ejection fraction, by estimation, is  45 to 50%. The left ventricle has mildly decreased function. The left ventricle has no regional wall motion abnormalities. There is mild concentric left ventricular hypertrophy. Left ventricular diastolic parameters are consistent with Grade II diastolic dysfunction (pseudonormalization). Elevated left ventricular end-diastolic pressure.  2. Right ventricular systolic function is normal. The right ventricular size is mildly enlarged. There is severely elevated pulmonary artery systolic pressure.  3. Left atrial size was severely dilated.  4. Right atrial size was severely dilated.  5. There is no evidence of cardiac tamponade.  6. The mitral valve is degenerative. Mild to moderate mitral valve regurgitation. No evidence of mitral stenosis. Moderate mitral annular calcification.  7. Tricuspid valve regurgitation is severe.  8. The aortic valve is tricuspid. There is mild calcification of the aortic valve. There is mild thickening of the aortic valve. Aortic valve regurgitation is not visualized. No aortic stenosis is present.  9. The inferior vena cava is dilated in size with >50% respiratory variability, suggesting right atrial pressure of 8 mmHg. Comparison(s): Compared with the echo 06/03/21, the pericardial effusion is larger. There is no tamponade. FINDINGS  Left Ventricle: Left ventricular ejection fraction, by estimation, is 45 to 50%. The left ventricle has mildly decreased function. The left ventricle has no regional wall motion abnormalities. The left ventricular internal cavity size was normal in size. There is mild concentric left ventricular hypertrophy. Left ventricular diastolic parameters are consistent with Grade II diastolic dysfunction (pseudonormalization). Elevated left ventricular end-diastolic pressure. Right Ventricle: The right ventricular size is mildly enlarged. No increase in right ventricular wall thickness. Right ventricular systolic function is normal. There is severely elevated  pulmonary artery systolic pressure. The tricuspid regurgitant velocity is 4.10 m/s, and with an assumed right atrial pressure of 8 mmHg, the estimated right ventricular systolic pressure is 68.0 mmHg. Left Atrium: Left atrial size was severely dilated. Right Atrium: Right atrial size was severely dilated. Pericardium: Trivial pericardial effusion is present. There is no evidence of cardiac tamponade. Mitral Valve: The mitral valve is degenerative in appearance. There is moderate thickening of the mitral valve leaflet(s). There is moderate calcification of the mitral valve leaflet(s). Moderate mitral annular calcification. Mild to moderate mitral  valve regurgitation. No evidence of mitral valve stenosis. Tricuspid Valve: The tricuspid valve is normal in structure. Tricuspid valve regurgitation is severe. No evidence of tricuspid stenosis. Aortic Valve: The aortic valve is tricuspid. There is mild calcification of the aortic valve. There is mild thickening of the aortic valve. Aortic valve regurgitation is not visualized. No aortic stenosis is present. Aortic valve peak gradient measures 8.5 mmHg. Pulmonic Valve: The pulmonic valve was normal in structure. Pulmonic valve regurgitation is trivial. No evidence of pulmonic stenosis. Aorta: The aortic root is normal in size and structure. Venous: The inferior vena cava is dilated in size with greater than 50% respiratory variability, suggesting right atrial pressure of 8 mmHg. IAS/Shunts: No atrial level shunt detected by color flow Doppler. Additional Comments: There is pleural effusion in the right lateral region.  LEFT VENTRICLE PLAX 2D LVIDd:         4.70 cm      Diastology LVIDs:         3.70 cm      LV e' medial:    5.11 cm/s LV PW:         1.30 cm      LV E/e' medial:  23.9 LV IVS:        1.30 cm      LV e' lateral:   9.25 cm/s LVOT diam:     1.70 cm      LV E/e' lateral: 13.2 LVOT Area:     2.27 cm  LV Volumes (MOD) LV vol d, MOD A2C: 105.0 ml LV vol d, MOD A4C:  113.0 ml LV vol s, MOD A2C: 64.0 ml LV vol s, MOD A4C: 65.9 ml LV SV MOD A2C:     41.0 ml LV SV MOD A4C:     113.0 ml LV SV MOD BP:      41.7 ml RIGHT VENTRICLE RV Basal diam:  4.70 cm RV Mid diam:    3.70 cm RV S prime:     8.70 cm/s TAPSE (M-mode): 1.0 cm LEFT ATRIUM             Index        RIGHT ATRIUM           Index LA diam:        4.40 cm 2.42 cm/m   RA Area:     24.50 cm LA Vol (A2C):   88.8 ml 48.86 ml/m  RA Volume:   85.70 ml  47.15 ml/m LA Vol (A4C):   60.4 ml 33.23 ml/m LA Biplane Vol: 73.0 ml 40.16 ml/m  AORTIC VALVE                 PULMONIC VALVE AV Area (Vmax): 1.91 cm     PV Vmax:          0.96 m/s AV Vmax:        146.00 cm/s  PV Peak grad:     3.7 mmHg AV Peak Grad:   8.5 mmHg     PR End Diast Vel: 13.25 msec LVOT Vmax:      123.00 cm/s  RVOT Peak grad:   1 mmHg  AORTA Ao Root diam: 2.90 cm MITRAL VALVE                TRICUSPID VALVE MV Area (PHT): 5.54 cm     TR Peak grad:   67.2 mmHg MV Decel Time: 137 msec     TR Vmax:  410.00 cm/s MV E velocity: 122.00 cm/s MV A velocity: 93.80 cm/s   SHUNTS MV E/A ratio:  1.30         Systemic Diam: 1.70 cm MV A Prime:    6.7 cm/s Skeet Latch MD Electronically signed by Skeet Latch MD Signature Date/Time: 07/20/2021/3:09:33 PM    Final      Discharge Exam: Vitals:   08/07/21 1300 08/07/21 1502  BP: 119/72 (!) 144/100  Pulse: 94 90  Resp:  (!) 22  Temp:    SpO2:  98%   Vitals:   08/07/21 1230 08/07/21 1245 08/07/21 1300 08/07/21 1502  BP: 128/90 119/84 119/72 (!) 144/100  Pulse: (!) 103 (!) 102 94 90  Resp:    (!) 22  Temp:      TempSrc:      SpO2:    98%  Weight:      Height:       I was not informed about patient leaving, please refer to my examination in my progress note on 08/07/2021.   The results of significant diagnostics from this hospitalization (including imaging, microbiology, ancillary and laboratory) are listed below for reference.     Microbiology: Recent Results (from the past 240 hour(s))   Resp Panel by RT-PCR (Flu A&B, Covid) Nasopharyngeal Swab     Status: None   Collection Time: 07/30/21  1:08 PM   Specimen: Nasopharyngeal Swab; Nasopharyngeal(NP) swabs in vial transport medium  Result Value Ref Range Status   SARS Coronavirus 2 by RT PCR NEGATIVE NEGATIVE Final    Comment: (NOTE) SARS-CoV-2 target nucleic acids are NOT DETECTED.  The SARS-CoV-2 RNA is generally detectable in upper respiratory specimens during the acute phase of infection. The lowest concentration of SARS-CoV-2 viral copies this assay can detect is 138 copies/mL. A negative result does not preclude SARS-Cov-2 infection and should not be used as the sole basis for treatment or other patient management decisions. A negative result may occur with  improper specimen collection/handling, submission of specimen other than nasopharyngeal swab, presence of viral mutation(s) within the areas targeted by this assay, and inadequate number of viral copies(<138 copies/mL). A negative result must be combined with clinical observations, patient history, and epidemiological information. The expected result is Negative.  Fact Sheet for Patients:  EntrepreneurPulse.com.au  Fact Sheet for Healthcare Providers:  IncredibleEmployment.be  This test is no t yet approved or cleared by the [place] FDA and  has been authorized for detection and/or diagnosis of SARS-CoV-2 by FDA under an Emergency Use Authorization (EUA). This EUA will remain  in effect (meaning this test can be used) for the duration of the COVID-19 declaration under Section 564(b)(1) of the Act, 21 U.S.C.section 360bbb-3(b)(1), unless the authorization is terminated  or revoked sooner.       Influenza A by PCR NEGATIVE NEGATIVE Final   Influenza B by PCR NEGATIVE NEGATIVE Final    Comment: (NOTE) The Xpert Xpress SARS-CoV-2/FLU/RSV plus assay is intended as an aid in the diagnosis of influenza from  Nasopharyngeal swab specimens and should not be used as a sole basis for treatment. Nasal washings and aspirates are unacceptable for Xpert Xpress SARS-CoV-2/FLU/RSV testing.  Fact Sheet for Patients: EntrepreneurPulse.com.au  Fact Sheet for Healthcare Providers: IncredibleEmployment.be  This test is not yet approved or cleared by the [place] FDA and has been authorized for detection and/or diagnosis of SARS-CoV-2 by FDA under an Emergency Use Authorization (EUA). This EUA will remain in effect (meaning this test can be used) for  the duration of the COVID-19 declaration under Section 564(b)(1) of the Act, 21 U.S.C. section 360bbb-3(b)(1), unless the authorization is terminated or revoked.  Performed at [facility], [address] [place], [place] [address]   Aerobic culture     Status: None (Preliminary result)   Collection Time: 08/02/21 11:08 AM  Result Value Ref Range Status   Aerobic Bacterial Culture WILL FOLLOW  Preliminary  Blood culture (routine x 2)     Status: None (Preliminary result)   Collection Time: 08/06/21 11:22 PM   Specimen: BLOOD  Result Value Ref Range Status   Specimen Description BLOOD RIGHT ASSIST CONTROL  Final   Special Requests   Final    BOTTLES DRAWN AEROBIC AND ANAEROBIC Blood Culture adequate volume   Culture   Final    NO GROWTH 2 DAYS Performed at [facility], [address], [place], [place] [address]    Report Status PENDING  Incomplete  Blood culture (routine x 2)     Status: None (Preliminary result)   Collection Time: 08/06/21 11:22 PM   Specimen: BLOOD  Result Value Ref Range Status   Specimen Description BLOOD LEFT ASSIST CONTROL  Final   Special Requests   Final    BOTTLES DRAWN AEROBIC AND ANAEROBIC Blood Culture adequate volume   Culture   Final    NO GROWTH 2 DAYS Performed at [facility], [address], [place], [place] [address]    Report Status  PENDING  Incomplete     Labs: BNP (last 3 results) Recent Labs    06/26/21 0804 07/23/21 0500 07/31/21 0521  BNP 4,471.7* >4,500.0* >6,160.7*   Basic Metabolic Panel: Recent Labs  Lab 08/06/21 2300  NA 143  K 4.5  CL 101  CO2 27  GLUCOSE 81  BUN 51*  CREATININE 9.76*  CALCIUM 9.2   Liver Function Tests: Recent Labs  Lab 08/06/21 2300  AST 17  ALT 9  ALKPHOS 102  BILITOT 1.1  PROT 7.7  ALBUMIN 3.2*   No results for input(s): LIPASE, AMYLASE in the last 168 hours. No results for input(s): AMMONIA in the last 168 hours. CBC: Recent Labs  Lab 08/06/21 2300 08/07/21 0706  WBC 7.3 6.6  NEUTROABS 5.3 4.6  HGB 7.4* 6.7*  HCT 23.7* 21.5*  MCV 99.2 96.4  PLT 195 168   Cardiac Enzymes: No results for input(s): CKTOTAL, CKMB, CKMBINDEX, TROPONINI in the last 168 hours. BNP: Invalid input(s): POCBNP CBG: No results for input(s): GLUCAP in the last 168 hours. D-Dimer No results for input(s): DDIMER in the last 72 hours. Hgb A1c No results for input(s): HGBA1C in the last 72 hours. Lipid Profile No results for input(s): CHOL, HDL, LDLCALC, TRIG, CHOLHDL, LDLDIRECT in the last 72 hours. Thyroid function studies No results for input(s): TSH, T4TOTAL, T3FREE, THYROIDAB in the last 72 hours.  Invalid input(s): FREET3 Anemia work up No results for input(s): VITAMINB12, FOLATE, FERRITIN, TIBC, IRON, RETICCTPCT in the last 72 hours. Urinalysis    Component Value Date/Time   COLORURINE YELLOW (A) 01/13/2021 0153   APPEARANCEUR CLEAR (A) 01/13/2021 0153   LABSPEC 1.012 01/13/2021 0153   PHURINE 9.0 (H) 01/13/2021 0153   GLUCOSEU 150 (A) 01/13/2021 0153   HGBUR NEGATIVE 01/13/2021 0153   BILIRUBINUR NEGATIVE 01/13/2021 0153   KETONESUR NEGATIVE 01/13/2021 0153   PROTEINUR >=300 (A) 01/13/2021 0153   NITRITE NEGATIVE 01/13/2021 0153   LEUKOCYTESUR NEGATIVE 01/13/2021 0153   Sepsis Labs Invalid input(s): PROCALCITONIN,  WBC,  [place] Microbiology Recent  Results (from the past 240 hour(s))  Resp Panel by RT-PCR (Flu A&B, Covid) Nasopharyngeal Swab     Status: None   Collection Time: 07/30/21  1:08 PM   Specimen: Nasopharyngeal Swab; Nasopharyngeal(NP) swabs in vial transport medium  Result Value Ref Range Status   SARS Coronavirus 2 by RT PCR NEGATIVE NEGATIVE Final    Comment: (NOTE) SARS-CoV-2 target nucleic acids are NOT DETECTED.  The SARS-CoV-2 RNA is generally detectable in upper respiratory specimens during the acute phase of infection. The lowest concentration of SARS-CoV-2 viral copies this assay can detect is 138 copies/mL. A negative result does not preclude SARS-Cov-2 infection and should not be used as the sole basis for treatment or other patient management decisions. A negative result may occur with  improper specimen collection/handling, submission of specimen other than nasopharyngeal swab, presence of viral mutation(s) within the areas targeted by this assay, and inadequate number of viral copies(<138 copies/mL). A negative result must be combined with clinical observations, patient history, and epidemiological information. The expected result is Negative.  Fact Sheet for Patients:  EntrepreneurPulse.com.au  Fact Sheet for Healthcare Providers:  IncredibleEmployment.be  This test is no t yet approved or cleared by the [place] FDA and  has been authorized for detection and/or diagnosis of SARS-CoV-2 by FDA under an Emergency Use Authorization (EUA). This EUA will remain  in effect (meaning this test can be used) for the duration of the COVID-19 declaration under Section 564(b)(1) of the Act, 21 U.S.C.section 360bbb-3(b)(1), unless the authorization is terminated  or revoked sooner.       Influenza A by PCR NEGATIVE NEGATIVE Final   Influenza B by PCR NEGATIVE NEGATIVE Final    Comment: (NOTE) The Xpert Xpress SARS-CoV-2/FLU/RSV plus assay is intended as an aid in the  diagnosis of influenza from Nasopharyngeal swab specimens and should not be used as a sole basis for treatment. Nasal washings and aspirates are unacceptable for Xpert Xpress SARS-CoV-2/FLU/RSV testing.  Fact Sheet for Patients: EntrepreneurPulse.com.au  Fact Sheet for Healthcare Providers: IncredibleEmployment.be  This test is not yet approved or cleared by the [place] FDA and has been authorized for detection and/or diagnosis of SARS-CoV-2 by FDA under an Emergency Use Authorization (EUA). This EUA will remain in effect (meaning this test can be used) for the duration of the COVID-19 declaration under Section 564(b)(1) of the Act, 21 U.S.C. section 360bbb-3(b)(1), unless the authorization is terminated or revoked.  Performed at [facility], [address] [place], [place] [address]   Aerobic culture     Status: None (Preliminary result)   Collection Time: 08/02/21 11:08 AM  Result Value Ref Range Status   Aerobic Bacterial Culture WILL FOLLOW  Preliminary  Blood culture (routine x 2)     Status: None (Preliminary result)   Collection Time: 08/06/21 11:22 PM   Specimen: BLOOD  Result Value Ref Range Status   Specimen Description BLOOD RIGHT ASSIST CONTROL  Final   Special Requests   Final    BOTTLES DRAWN AEROBIC AND ANAEROBIC Blood Culture adequate volume   Culture   Final    NO GROWTH 2 DAYS Performed at [facility], [address] [place], [place] [address]    Report Status PENDING  Incomplete  Blood culture (routine x 2)     Status: None (Preliminary result)   Collection Time: 08/06/21 11:22 PM   Specimen: BLOOD  Result Value Ref Range Status   Specimen Description BLOOD LEFT ASSIST CONTROL  Final   Special Requests   Final    BOTTLES DRAWN AEROBIC AND ANAEROBIC Blood Culture adequate volume   Culture   Final    NO GROWTH 2 DAYS Performed at [facility], [place], [place]  [address]    Report Status PENDING  Incomplete     Time coordinating discharge: Over 30 minutes  SIGNED:   Darliss Cheney, MD  [facility] 08/08/2021, 1:09 PM  If 7PM-7AM, please contact night-coverage [email or web address]

## 2021-08-09 ENCOUNTER — Telehealth: Payer: Self-pay

## 2021-08-10 ENCOUNTER — Emergency Department (HOSPITAL_COMMUNITY): Payer: Medicare Other

## 2021-08-10 ENCOUNTER — Other Ambulatory Visit: Payer: Self-pay

## 2021-08-10 ENCOUNTER — Inpatient Hospital Stay (HOSPITAL_COMMUNITY)
Admission: EM | Admit: 2021-08-10 | Discharge: 2021-08-11 | DRG: 862 | Discharge status: Against Medical Advice | Payer: Medicare Other | Attending: Family Medicine | Admitting: Family Medicine

## 2021-08-10 ENCOUNTER — Inpatient Hospital Stay (HOSPITAL_COMMUNITY): Payer: Medicare Other

## 2021-08-10 ENCOUNTER — Encounter (HOSPITAL_COMMUNITY): Payer: Self-pay

## 2021-08-10 DIAGNOSIS — L03114 Cellulitis of left upper limb: Secondary | ICD-10-CM | POA: Diagnosis present

## 2021-08-10 DIAGNOSIS — B86 Scabies: Secondary | ICD-10-CM | POA: Diagnosis present

## 2021-08-10 DIAGNOSIS — R188 Other ascites: Secondary | ICD-10-CM | POA: Diagnosis present

## 2021-08-10 DIAGNOSIS — I700 Atherosclerosis of aorta: Secondary | ICD-10-CM | POA: Diagnosis present

## 2021-08-10 DIAGNOSIS — D631 Anemia in chronic kidney disease: Secondary | ICD-10-CM | POA: Diagnosis present

## 2021-08-10 DIAGNOSIS — Z833 Family history of diabetes mellitus: Secondary | ICD-10-CM | POA: Diagnosis not present

## 2021-08-10 DIAGNOSIS — L299 Pruritus, unspecified: Secondary | ICD-10-CM | POA: Diagnosis present

## 2021-08-10 DIAGNOSIS — T8141XA Infection following a procedure, superficial incisional surgical site, initial encounter: Secondary | ICD-10-CM | POA: Diagnosis present

## 2021-08-10 DIAGNOSIS — N186 End stage renal disease: Secondary | ICD-10-CM

## 2021-08-10 DIAGNOSIS — R109 Unspecified abdominal pain: Secondary | ICD-10-CM

## 2021-08-10 DIAGNOSIS — T8149XA Infection following a procedure, other surgical site, initial encounter: Secondary | ICD-10-CM | POA: Diagnosis present

## 2021-08-10 DIAGNOSIS — Z888 Allergy status to other drugs, medicaments and biological substances status: Secondary | ICD-10-CM

## 2021-08-10 DIAGNOSIS — L89322 Pressure ulcer of left buttock, stage 2: Secondary | ICD-10-CM | POA: Diagnosis present

## 2021-08-10 DIAGNOSIS — Z20822 Contact with and (suspected) exposure to covid-19: Secondary | ICD-10-CM | POA: Diagnosis present

## 2021-08-10 DIAGNOSIS — I480 Paroxysmal atrial fibrillation: Secondary | ICD-10-CM | POA: Diagnosis present

## 2021-08-10 DIAGNOSIS — Z992 Dependence on renal dialysis: Secondary | ICD-10-CM | POA: Diagnosis not present

## 2021-08-10 DIAGNOSIS — Y832 Surgical operation with anastomosis, bypass or graft as the cause of abnormal reaction of the patient, or of later complication, without mention of misadventure at the time of the procedure: Secondary | ICD-10-CM | POA: Diagnosis present

## 2021-08-10 DIAGNOSIS — I5043 Acute on chronic combined systolic (congestive) and diastolic (congestive) heart failure: Secondary | ICD-10-CM | POA: Diagnosis present

## 2021-08-10 DIAGNOSIS — F17210 Nicotine dependence, cigarettes, uncomplicated: Secondary | ICD-10-CM | POA: Diagnosis present

## 2021-08-10 DIAGNOSIS — Z91199 Patient's noncompliance with other medical treatment and regimen due to unspecified reason: Secondary | ICD-10-CM

## 2021-08-10 DIAGNOSIS — Z79899 Other long term (current) drug therapy: Secondary | ICD-10-CM

## 2021-08-10 DIAGNOSIS — T8130XA Disruption of wound, unspecified, initial encounter: Secondary | ICD-10-CM | POA: Diagnosis present

## 2021-08-10 DIAGNOSIS — E875 Hyperkalemia: Secondary | ICD-10-CM | POA: Diagnosis present

## 2021-08-10 DIAGNOSIS — Z5329 Procedure and treatment not carried out because of patient's decision for other reasons: Secondary | ICD-10-CM | POA: Diagnosis present

## 2021-08-10 DIAGNOSIS — J449 Chronic obstructive pulmonary disease, unspecified: Secondary | ICD-10-CM | POA: Diagnosis present

## 2021-08-10 DIAGNOSIS — J439 Emphysema, unspecified: Secondary | ICD-10-CM | POA: Diagnosis present

## 2021-08-10 DIAGNOSIS — L89312 Pressure ulcer of right buttock, stage 2: Secondary | ICD-10-CM | POA: Diagnosis present

## 2021-08-10 DIAGNOSIS — N2581 Secondary hyperparathyroidism of renal origin: Secondary | ICD-10-CM | POA: Diagnosis present

## 2021-08-10 DIAGNOSIS — Z9115 Patient's noncompliance with renal dialysis: Secondary | ICD-10-CM | POA: Diagnosis not present

## 2021-08-10 DIAGNOSIS — T827XXA Infection and inflammatory reaction due to other cardiac and vascular devices, implants and grafts, initial encounter: Secondary | ICD-10-CM | POA: Diagnosis present

## 2021-08-10 DIAGNOSIS — Z841 Family history of disorders of kidney and ureter: Secondary | ICD-10-CM

## 2021-08-10 DIAGNOSIS — I132 Hypertensive heart and chronic kidney disease with heart failure and with stage 5 chronic kidney disease, or end stage renal disease: Secondary | ICD-10-CM | POA: Diagnosis present

## 2021-08-10 LAB — CBC WITH DIFFERENTIAL/PLATELET
Abs Immature Granulocytes: 0.1 K/uL — ABNORMAL HIGH (ref 0.00–0.07)
Basophils Absolute: 0 K/uL (ref 0.0–0.1)
Basophils Relative: 1 %
Eosinophils Absolute: 0.2 K/uL (ref 0.0–0.5)
Eosinophils Relative: 3 %
HCT: 25.4 % — ABNORMAL LOW (ref 39.0–52.0)
Hemoglobin: 7.7 g/dL — ABNORMAL LOW (ref 13.0–17.0)
Immature Granulocytes: 1 %
Lymphocytes Relative: 11 %
Lymphs Abs: 1 K/uL (ref 0.7–4.0)
MCH: 30.9 pg (ref 26.0–34.0)
MCHC: 30.3 g/dL (ref 30.0–36.0)
MCV: 102 fL — ABNORMAL HIGH (ref 80.0–100.0)
Monocytes Absolute: 0.8 K/uL (ref 0.1–1.0)
Monocytes Relative: 9 %
Neutro Abs: 6.4 K/uL (ref 1.7–7.7)
Neutrophils Relative %: 75 %
Platelets: 161 K/uL (ref 150–400)
RBC: 2.49 MIL/uL — ABNORMAL LOW (ref 4.22–5.81)
RDW: 18.2 % — ABNORMAL HIGH (ref 11.5–15.5)
WBC: 8.6 K/uL (ref 4.0–10.5)
nRBC: 0 % (ref 0.0–0.2)

## 2021-08-10 LAB — COMPREHENSIVE METABOLIC PANEL WITH GFR
ALT: 12 U/L (ref 0–44)
AST: 18 U/L (ref 15–41)
Albumin: 3.3 g/dL — ABNORMAL LOW (ref 3.5–5.0)
Alkaline Phosphatase: 121 U/L (ref 38–126)
Anion gap: 16 — ABNORMAL HIGH (ref 5–15)
BUN: 46 mg/dL — ABNORMAL HIGH (ref 6–20)
CO2: 26 mmol/L (ref 22–32)
Calcium: 9.9 mg/dL (ref 8.9–10.3)
Chloride: 101 mmol/L (ref 98–111)
Creatinine, Ser: 9.15 mg/dL — ABNORMAL HIGH (ref 0.61–1.24)
GFR, Estimated: 6 mL/min — ABNORMAL LOW
Glucose, Bld: 91 mg/dL (ref 70–99)
Potassium: 5.5 mmol/L — ABNORMAL HIGH (ref 3.5–5.1)
Sodium: 143 mmol/L (ref 135–145)
Total Bilirubin: 1 mg/dL (ref 0.3–1.2)
Total Protein: 7.6 g/dL (ref 6.5–8.1)

## 2021-08-10 LAB — PROTIME-INR
INR: 1.3 — ABNORMAL HIGH (ref 0.8–1.2)
Prothrombin Time: 16.3 s — ABNORMAL HIGH (ref 11.4–15.2)

## 2021-08-10 LAB — RESP PANEL BY RT-PCR (FLU A&B, COVID) ARPGX2
Influenza A by PCR: NEGATIVE
Influenza B by PCR: NEGATIVE
SARS Coronavirus 2 by RT PCR: NEGATIVE

## 2021-08-10 LAB — APTT: aPTT: 34 s (ref 24–36)

## 2021-08-10 LAB — LACTIC ACID, PLASMA: Lactic Acid, Venous: 1.4 mmol/L (ref 0.5–1.9)

## 2021-08-10 IMAGING — DX DG CHEST 1V PORT
1 series · 1 of 1 positions shown · non-contrast
Comparison: Portable chest [DATE].

CLINICAL DATA: Questionable sepsis.

EXAM:
PORTABLE CHEST 1 VIEW

[chest]
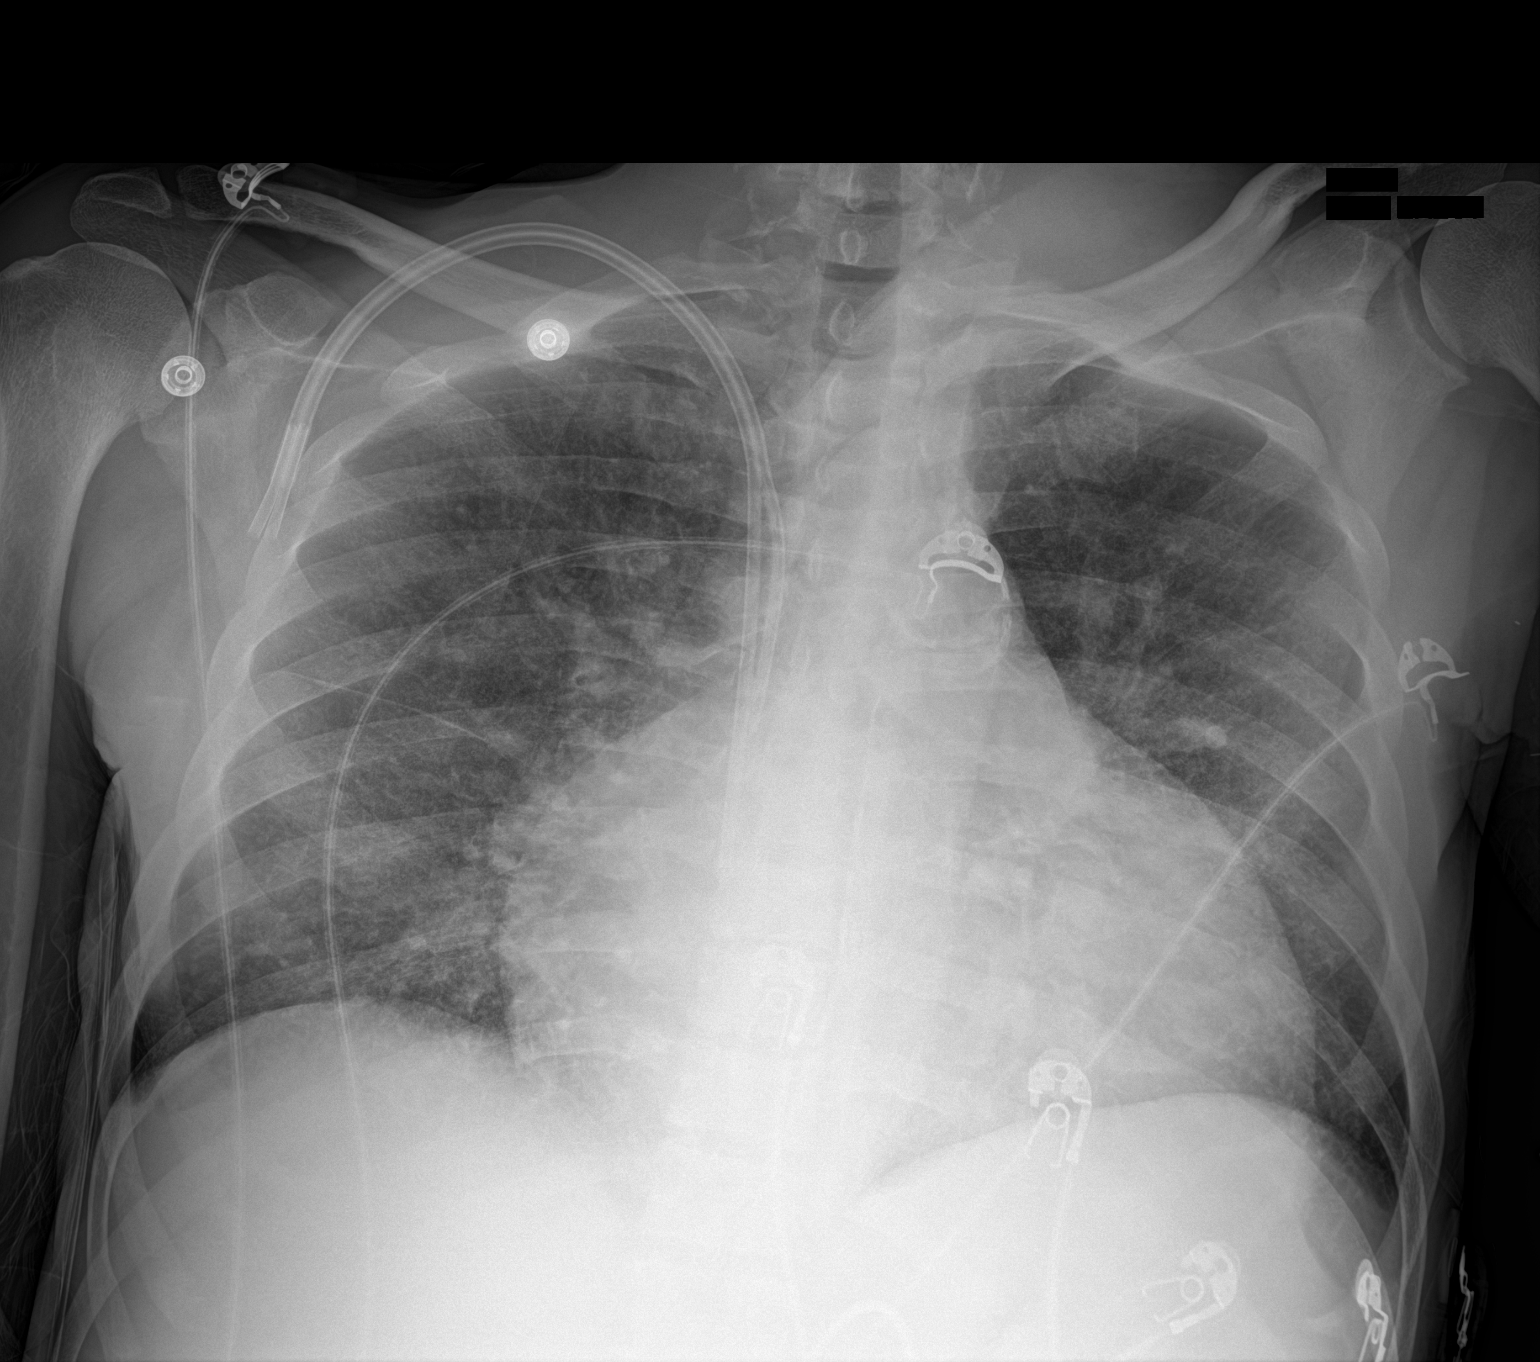

[1 of 1 positions shown; findings below may reference images not displayed]

FINDINGS: Right IJ dialysis catheter again has its tip about the cavoatrial
junction. The heart is enlarged. There is mild perihilar vascular
congestion, mild perihilar interstitial edema and minimal pleural
effusions.

No focal lung consolidation is seen. The aorta is heavily calcified.
Similar findings were noted previously but the patchy infiltrates of
the mid and lower lung fields on the prior study are not seen today.
IMPRESSION: Cardiomegaly with mild perihilar vascular congestion and central
edema, with airspace infiltrates on the prior study not seen today.

## 2021-08-10 IMAGING — DX DG ABDOMEN 1V
1 series · 1 of 1 positions shown · non-contrast
Comparison: [DATE]

CLINICAL DATA: Abdominal pain, fever

EXAM:
ABDOMEN - 1 VIEW

[abdomen]
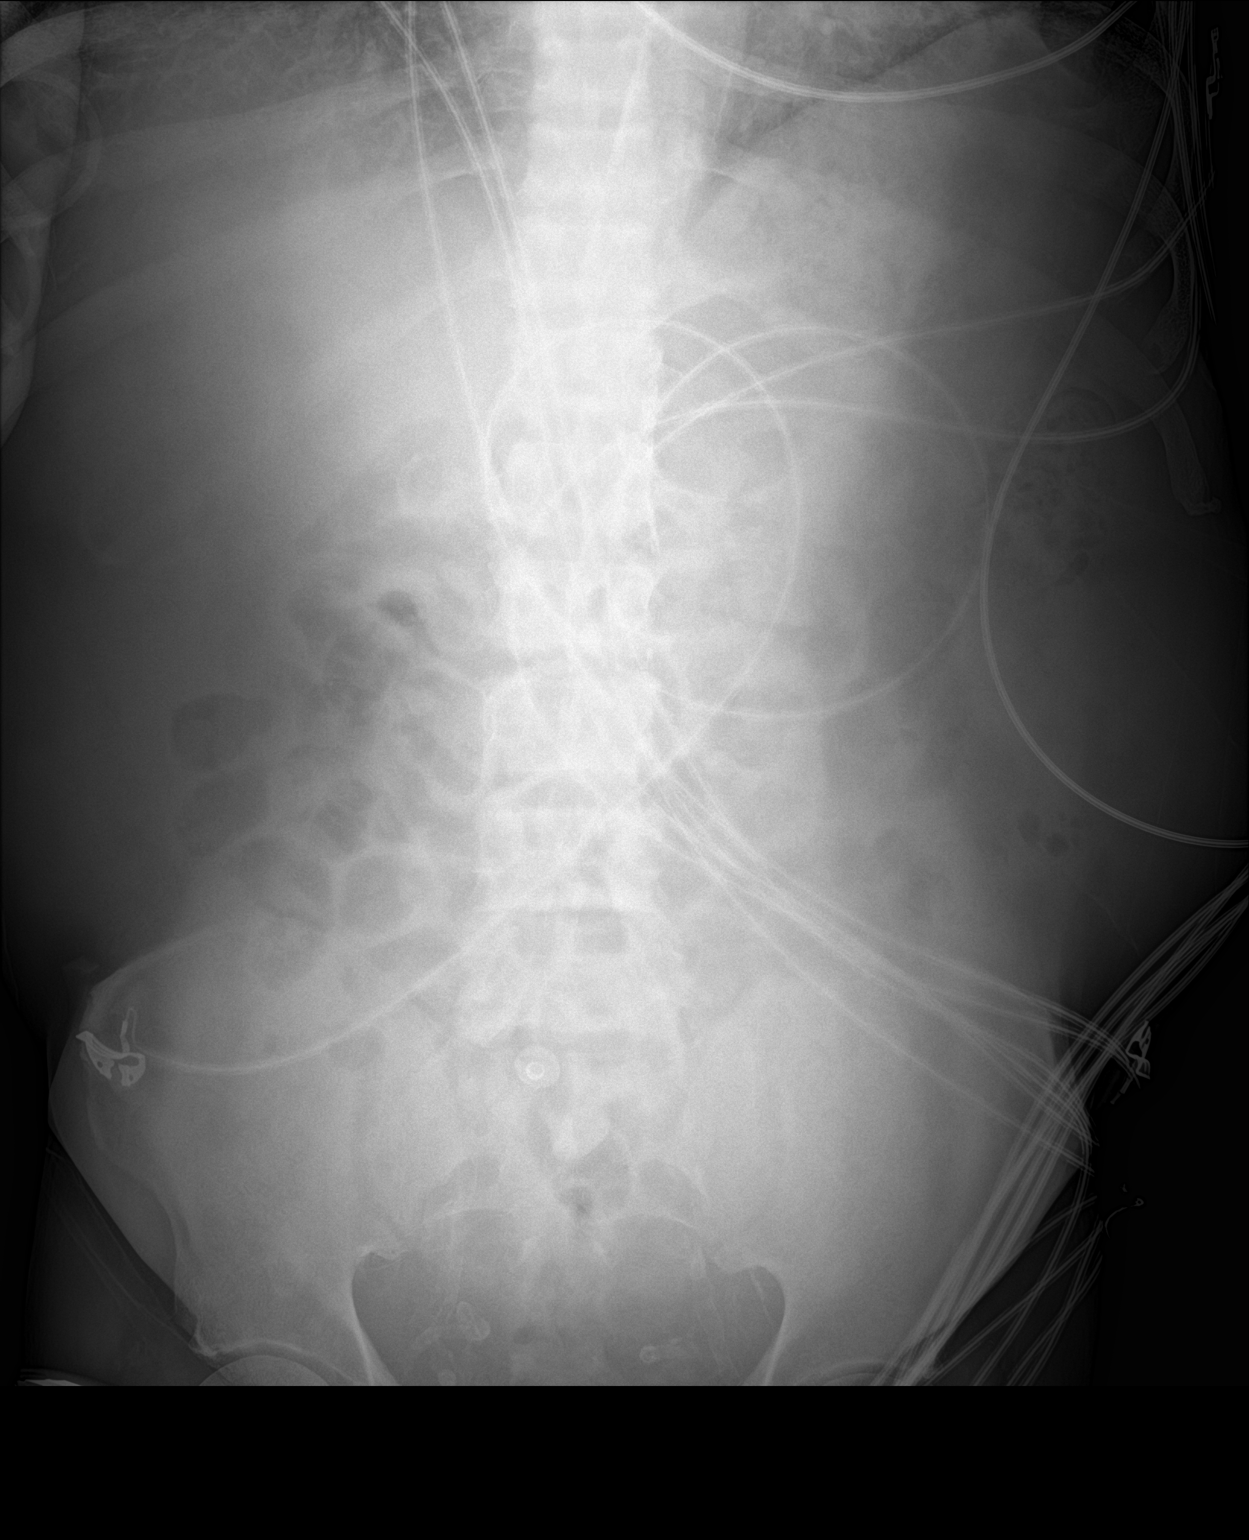

[1 of 1 positions shown; findings below may reference images not displayed]

FINDINGS: Supine frontal view of the abdomen and pelvis excludes the right
hemidiaphragm and bilateral flanks by collimation. Centralization of
gas-filled bowel within the central abdomen may reflect underlying
ascites. No evidence of bowel obstruction or ileus. No masses or
abnormal calcifications. No acute bony abnormalities.
IMPRESSION: 1. No bowel obstruction or ileus.
2. Centralization of gas-filled loops of bowel within the central
abdomen may signify underlying ascites.

## 2021-08-10 MED ORDER — PERMETHRIN 5 % EX CREA
TOPICAL_CREAM | Freq: Every day | CUTANEOUS | Status: DC
  Filled 2021-08-10: qty 60

## 2021-08-10 MED ORDER — SODIUM CHLORIDE 0.9 % IV SOLN
2.0000 g | Freq: Once | INTRAVENOUS | Status: AC
  Administered 2021-08-10: 2 g via INTRAVENOUS
  Filled 2021-08-10: qty 2

## 2021-08-10 MED ORDER — SODIUM ZIRCONIUM CYCLOSILICATE 5 G PO PACK
5.0000 g | PACK | Freq: Once | ORAL | Status: AC
  Administered 2021-08-11: 5 g via ORAL
  Filled 2021-08-10: qty 1

## 2021-08-10 MED ORDER — DIPHENHYDRAMINE HCL 50 MG/ML IJ SOLN
12.5000 mg | Freq: Once | INTRAMUSCULAR | Status: AC
  Administered 2021-08-11: 12.5 mg via INTRAVENOUS
  Filled 2021-08-10: qty 1

## 2021-08-10 NOTE — ED Triage Notes (Signed)
Ems was called out for wound check.  Pt old HD site was taken out 3 weeks ago and new one placed in upper right chest the old site per EMS is draining pus.  Fever 101 RR 24 BP 158/96  HR 106 98 3L which is home o2

## 2021-08-10 NOTE — H&P (Signed)
History and Physical    Jordan [name]:[id number] DOB: March 11, 1966 DOA: 08/10/2021  PCP: [facility], [facility], MD  Patient coming from: Home  I have personally briefly reviewed patient's old medical records in [place]  Chief Complaint: Worsening left AV fistula wound  HPI: Jordan [name] is a 55 y.o. 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  Later found to have fistula infection with Serratia bacteremia and had removal of infected AV fistula by vascular surgery on 10/26.Started on IV cefepime but left AMA.  Had perm cath placed for access by vascular on 11/4.  Readmitted again on 11/9 with acute hypoxic respiratory failure from missed dialysis and also planned debridement and placement of wound VAC on AV graft wound.  However left AMA again the following day.  11/16- admission at [facility] for worsening wound infection and left AMA once again   Now here with again worsening drainage and pain from wound. Reports last dialysis yesterday but unable to finish full session due to pruritic skin. He is unsure whether he has been receiving his IV cefepime with dialysis.  Also notes chills and nausea.  Has been having abdominal distention with diffuse pain.  Some mild diarrhea  ED Course: He was afebrile, tachycardic and tachypneic requiring home 2.5 L.  No leukocytosis, hemoglobin of 7.7 with a baseline around 69. Sodium 143, K of 5.5, creatinine of 9.15 and anion gap of 16.   Review of Systems: Constitutional: No Weight  Change, No Fever ENT/Mouth: No sore throat, No Rhinorrhea Eyes: No Eye Pain, No Vision Changes Cardiovascular: No Chest Pain, + SOB Respiratory: + Cough, No Sputum, No Wheezing, + Dyspnea  Gastrointestinal: + Nausea, No Vomiting, No Diarrhea, No Constipation, No Pain Genitourinary: no Urinary Incontinence Musculoskeletal: No Arthralgias, No Myalgias Skin: No Skin Lesions, No Pruritus, Neuro: no Weakness, No Numbness Psych: No Anxiety/Panic, No Depression, no decrease appetite Heme/Lymph: No Bruising, No Bleeding  Past Medical History:  Diagnosis Date   Anemia    Aortic atherosclerosis ([place]) 11/12/2019   ED (erectile dysfunction)    Emphysema of lung ([place]) 11/12/2019   ESRD on hemodialysis ([place])    M/W/F in [place], [place]   ETOH abuse    History of blood transfusion    Hypertension    Wears dentures     Past Surgical History:  Procedure Laterality Date   A/V FISTULAGRAM Left 07/15/2021   Procedure: A/V FISTULAGRAM;  Surgeon: Algernon Huxley, MD;  Location: [place] [facility];  Service: Cardiovascular;  Laterality: Left;   BASCILIC VEIN TRANSPOSITION Left 08/07/2016   Procedure: LEFT BASILIC VEIN TRANSPOSITION;  Surgeon: Angelia Mould, MD;  Location: [place];  Service: Vascular;  Laterality: Left;   CLOSED REDUCTION NASAL FRACTURE N/A 08/11/2019   Procedure: CLOSED REDUCTION NASAL FRACTURE WITH STABILIZATION;  Surgeon: Irene Limbo, MD;  Location: [place];  Service: Plastics;  Laterality: N/A;   COLONOSCOPY N/A 08/12/2016   Procedure: COLONOSCOPY;  Surgeon: Otis Brace, MD;  Location: [place];  Service: Gastroenterology;  Laterality: N/A;   COLONOSCOPY WITH PROPOFOL N/A 06/05/2021  Procedure: COLONOSCOPY WITH PROPOFOL;  Surgeon: Sharyn Creamer, MD;  Location: [place];  Service: Gastroenterology;  Laterality: N/A;   DIALYSIS/PERMA CATHETER INSERTION N/A 07/26/2021   Procedure: DIALYSIS/PERMA CATHETER INSERTION;  Surgeon: Algernon Huxley, MD;  Location: [place] [facility];  Service: Cardiovascular;  Laterality: N/A;   ESOPHAGOGASTRODUODENOSCOPY N/A 08/12/2016   Procedure: ESOPHAGOGASTRODUODENOSCOPY (EGD);  Surgeon: Otis Brace, MD;  Location: [place];  Service: Gastroenterology;  Laterality: N/A;   ESOPHAGOGASTRODUODENOSCOPY (EGD) WITH PROPOFOL N/A 06/05/2021   Procedure: ESOPHAGOGASTRODUODENOSCOPY (EGD) WITH PROPOFOL;  Surgeon: Sharyn Creamer, MD;  Location: [place];  Service: Gastroenterology;  Laterality: N/A;   INSERTION OF DIALYSIS CATHETER N/A 08/07/2016   Procedure: INSERTION OF TUNNELED DIALYSIS CATHETER;  Surgeon: Angelia Mould, MD;  Location: [place];  Service: Vascular;  Laterality: N/A;   LIGATION OF ARTERIOVENOUS  FISTULA Left 09/12/2016   Procedure: BANDING OF LEFT  ARTERIOVENOUS  FISTULA;  Surgeon: Angelia Mould, MD;  Location: [place];  Service: Vascular;  Laterality: Left;   LIGATION OF ARTERIOVENOUS  FISTULA Left 07/17/2021   Procedure: LIGATION OF ARTERIOVENOUS  FISTULA;  Surgeon: Katha Cabal, MD;  Location: [facility];  Service: Vascular;  Laterality: Left;  also include placement of temporary dialysis catheter right or left groin   LOWER EXTREMITY INTERVENTION Right 12/02/2018   Procedure: LOWER EXTREMITY INTERVENTION;  Surgeon: Algernon Huxley, MD;  Location: [place] [facility];  Service: Cardiovascular;  Laterality: Right;   POLYPECTOMY  06/05/2021   Procedure: POLYPECTOMY;  Surgeon: Sharyn Creamer, MD;  Location: [facility];  Service: Gastroenterology;;   TEMPORARY DIALYSIS CATHETER Left 07/17/2021   Procedure: TEMPORARY DIALYSIS CATHETER;  Surgeon: Katha Cabal, MD;  Location: [facility];  Service: Vascular;  Laterality: Left;     reports that he has been smoking cigarettes. He has a 20.00 pack-year smoking history. He has never used smokeless tobacco. He reports that he does not currently use alcohol after a past usage of about 21.0 standard drinks per week. He reports that he does not use  drugs. Social History  Allergies  Allergen Reactions   Lisinopril Swelling    Family History  Problem Relation Age of Onset   Diabetes Mother    Kidney failure Mother    Healthy Father    Kidney failure Brother    Healthy Sister    Kidney disease Daughter    Post-traumatic stress disorder Neg Hx    Bladder Cancer Neg Hx    Kidney cancer Neg Hx      Prior to Admission medications   Medication Sig Start Date End Date Taking? Authorizing Provider  albuterol (VENTOLIN HFA) 108 (90 Base) MCG/ACT inhaler Inhale 2 puffs into the lungs every 4 (four) hours as needed for wheezing or shortness of breath. 06/05/21   Danford, Suann Larry, MD  calcium acetate (PHOSLO) 667 MG capsule Take 2 capsules (1,334 mg total) by mouth 3 (three) times daily with meals. 06/05/21   Danford, Suann Larry, MD  cinacalcet (SENSIPAR) 30 MG tablet Take 1 tablet (30 mg total) by mouth daily with breakfast. 06/05/21   Danford, Suann Larry, MD  docusate sodium (COLACE) 100 MG capsule Take 1 capsule (100 mg total) by mouth 2 (two) times daily. 06/05/21   Edwin Dada, MD  epoetin alfa (EPOGEN) 4000 UNIT/ML injection Inject 1 mL (4,000 Units total) into the vein every Monday, Wednesday, and Friday with hemodialysis. 06/22/20   Enzo Bi, MD  ferrous sulfate 325 (65 FE) MG tablet  Take 1 tablet (325 mg total) by mouth 2 (two) times daily with a meal. 06/05/21   Danford, Suann Larry, MD  metoprolol tartrate (LOPRESSOR) 50 MG tablet Take 1 tablet (50 mg total) by mouth 2 (two) times daily. 07/04/21   Jennye Boroughs, MD  pantoprazole (PROTONIX) 20 MG tablet Take 1 tablet (20 mg total) by mouth 2 (two) times daily before a meal. 06/05/21 08/07/21  Edwin Dada, MD    Physical Exam: Vitals:   08/10/21 2100 08/10/21 2115 08/10/21 2130 08/10/21 2200  BP: (!) 154/104 (!) 167/127 (!) 165/106 (!) 174/105  Pulse: (!) 101 (!) 105 (!) 106 (!) 102  Resp: (!) 24 (!) 23 (!) 24 (!) 24  Temp:      TempSrc:       SpO2: 93% 95% 93% 90%  Weight:      Height:        Constitutional: NAD, calm, nontoxic appearing male laying flat in bed Vitals:   08/10/21 2100 08/10/21 2115 08/10/21 2130 08/10/21 2200  BP: (!) 154/104 (!) 167/127 (!) 165/106 (!) 174/105  Pulse: (!) 101 (!) 105 (!) 106 (!) 102  Resp: (!) 24 (!) 23 (!) 24 (!) 24  Temp:      TempSrc:      SpO2: 93% 95% 93% 90%  Weight:      Height:       Eyes: PERRL, lids and conjunctivae normal ENMT: Mucous membranes are moist.  Neck: normal, supple Respiratory: clear to auscultation bilaterally, no wheezing, no crackles.  Frequent deep cough with labored respiration requiring placement on 2.5 L nasal cannula Cardiovascular: Regular rate and rhythm, no murmurs / rubs / gallops. No extremity edema.  Abdomen: no tenderness, no masses palpated. No hepatosplenomegaly. Bowel sounds positive.  Musculoskeletal: no clubbing / cyanosis. No joint deformity upper and lower extremities.  Skin: Large dehiscent left AV fistula wound with serous fluid drainage. Scattered circular crusted skin lesions throughout lower extremity with pruritus Neurologic: CN 2-12 grossly intact. Sensation intact, Strength 5/5 in all 4.  Psychiatric: Normal judgment and insight. Alert and oriented x 3. Normal mood.    Labs on Admission: I have personally reviewed following labs and imaging studies  CBC: Recent Labs  Lab 08/06/21 2300 08/07/21 0706 08/10/21 1931  WBC 7.3 6.6 8.6  NEUTROABS 5.3 4.6 6.4  HGB 7.4* 6.7* 7.7*  HCT 23.7* 21.5* 25.4*  MCV 99.2 96.4 102.0*  PLT 195 168 272   Basic Metabolic Panel: Recent Labs  Lab 08/06/21 2300 08/10/21 1931  NA 143 143  K 4.5 5.5*  CL 101 101  CO2 27 26  GLUCOSE 81 91  BUN 51* 46*  CREATININE 9.76* 9.15*  CALCIUM 9.2 9.9   GFR: Estimated Creatinine Clearance: 8.4 mL/min (A) (by C-G formula based on SCr of 9.15 mg/dL (H)). Liver Function Tests: Recent Labs  Lab 08/06/21 2300 08/10/21 1931  AST 17 18  ALT 9  12  ALKPHOS 102 121  BILITOT 1.1 1.0  PROT 7.7 7.6  ALBUMIN 3.2* 3.3*   No results for input(s): LIPASE, AMYLASE in the last 168 hours. No results for input(s): AMMONIA in the last 168 hours. Coagulation Profile: Recent Labs  Lab 08/10/21 1931  INR 1.3*   Cardiac Enzymes: No results for input(s): CKTOTAL, CKMB, CKMBINDEX, TROPONINI in the last 168 hours. BNP (last 3 results) No results for input(s): PROBNP in the last 8760 hours. HbA1C: No results for input(s): HGBA1C in the last 72 hours. CBG: No results for input(s):  GLUCAP in the last 168 hours. Lipid Profile: No results for input(s): CHOL, HDL, LDLCALC, TRIG, CHOLHDL, LDLDIRECT in the last 72 hours. Thyroid Function Tests: No results for input(s): TSH, T4TOTAL, FREET4, T3FREE, THYROIDAB in the last 72 hours. Anemia Panel: No results for input(s): VITAMINB12, FOLATE, FERRITIN, TIBC, IRON, RETICCTPCT in the last 72 hours. Urine analysis:    Component Value Date/Time   COLORURINE YELLOW (A) 01/13/2021 0153   APPEARANCEUR CLEAR (A) 01/13/2021 0153   LABSPEC 1.012 01/13/2021 0153   PHURINE 9.0 (H) 01/13/2021 0153   GLUCOSEU 150 (A) 01/13/2021 0153   HGBUR NEGATIVE 01/13/2021 0153   BILIRUBINUR NEGATIVE 01/13/2021 0153   KETONESUR NEGATIVE 01/13/2021 0153   PROTEINUR >=300 (A) 01/13/2021 0153   NITRITE NEGATIVE 01/13/2021 0153   LEUKOCYTESUR NEGATIVE 01/13/2021 0153    Radiological Exams on Admission: DG Chest Port 1 View  Result Date: 08/10/2021 CLINICAL DATA:  Questionable sepsis. EXAM: PORTABLE CHEST 1 VIEW COMPARISON:  Portable chest 08/06/2021. FINDINGS: Right IJ dialysis catheter again has its tip about the cavoatrial junction. The heart is enlarged. There is mild perihilar vascular congestion, mild perihilar interstitial edema and minimal pleural effusions. No focal lung consolidation is seen. The aorta is heavily calcified. Similar findings were noted previously but the patchy infiltrates of the mid and lower  lung fields on the prior study are not seen today. IMPRESSION: Cardiomegaly with mild perihilar vascular congestion and central edema, with airspace infiltrates on the prior study not seen today. Electronically Signed   By: Telford Nab M.D.   On: 08/10/2021 20:24      Assessment/Plan  Left AV fistula wound dehiscence/infection S/p removal of infected AV fistula on 10/20 with Serratia bacteremia -Previously receiving IV cefepime with dialysis.  Continue antibiotics here.  Need ID consult in the morning to assist with duration of treatment. -Need vascular surgery consult.  There has been plans for debridement in past admissions but patient  repeatedly left AMA before procedure can be done  Hyperkalemia -K of 5.5. No EKG changes. -give Lokelma   ESRD on HD MWF Fluid overload/Ascites -Reports last dialysis Friday but did not complete full session due to complaints of pruritus - Needs nephrology consult for HD  Chronic diastolic and systolic heart failure with possible mild CHF exacerbation -Chest x-ray showing mild vascular congestion.  Also having increased abdominal distention and dyspnea -Did not complete full dialysis session yesterday. 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         Orene Desanctis DO [facility]   If 7PM-7AM, please contact  night-coverage [email or web address]   08/10/2021, 10:53 PM

## 2021-08-10 NOTE — ED Provider Notes (Signed)
Pt seen in conjunction with E Conklin, PA-C. Please see her note for full history, physical, exam.  In brief, patient presenting for evaluation of worsening left upper arm pain and generalized weakness.  He states he had the AV fistula removed, and has had issues with infection since.  Per chart review, patient has been seen multiple times for this, unfortunately frequently leaves AMA and is noncompliant with medications at home.  Labs do not show any evidence of sepsis today, however in the setting of worsening symptoms, concern for worsening infection.  On exam, he has copious serous fluid draining from the wound, the area is tender and skin is mildly indurated.  As I am concerned about his ability to properly manage this at home, and he has worsening symptoms, will call for admission.  Discussed with Dr. Flossie Buffy from [facility], patient be admitted.     Franchot Heidelberg, PA-C 08/10/21 2228    Dorie Rank, MD 08/11/21 1314

## 2021-08-10 NOTE — ED Provider Notes (Signed)
[facility] EMERGENCY DEPARTMENT Provider Note   CSN: [id number] Arrival date & time: 08/10/21  1756     History No chief complaint on file.   Jordan [name] is a 55 y.o. male with medical history significant for ESRD on HD MWF, COPD, history of noncompliance with dialysis, frequent hospitalizations for fluid overload and several recent AMA discharges who presents again today for evaluation of a known left AV graft infection.  Patient was seen and admitted to the hospital for the presumed infection of the wound on 08/06/2021, however patient left AMA on 08/07/2021.  Patient has been on cefepime with his dialysis since 07/17/2021 following his positive blood cultures of the site.  When he was admitted here 3 days ago, vascular reassessed the area and recommended that patient follow-up in 1 week as outpatient.  Today patient comes back for evaluation of his wound.  Reports that he feels that it is getting worse with increased drainage.  Brought in by EMS who reported a temperature of 101. Temp 98 F with oral thermometer in the ED.  Patient states he is on a Monday Wednesday Friday dialysis regimen, however states that his last dialysis was Thursday, 08/08/2021.  Cannot provide clear reason why he was on an alternate schedule.  Per chart review he has a history of not attending his dialysis appointments.   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 08/07/2021   Wound infection after surgery 08/07/2021   CAP (community acquired pneumonia) 07/30/2021   ABLA (acute blood loss  anemia) 07/17/2021   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 ([place]) 07/17/2021   AV fistula infection, 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/17/2021   AV fistula thrombosis, 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/17/2021   Pressure injury of skin 07/15/2021   Acute on chronic respiratory failure with hypoxemia ([place]) 07/12/2021   Ascites    Protein-calorie malnutrition, severe 06/05/2021   Hypervolemia associated with renal insufficiency 06/02/2021   Symptomatic anemia 05/28/2021   Acute on chronic diastolic CHF (congestive heart failure) ([place]) 05/28/2021   Hyperkalemia 05/28/2021   Pulmonary edema 04/29/2021   Pruritus 04/29/2021   COVID-19 virus infection 04/29/2021   Thrombocytopenia ([place]) 04/08/2021   COPD (chronic obstructive pulmonary disease) ([place]) 04/08/2021   CHF (congestive heart failure) ([place]) 03/25/2021   PAF (paroxysmal atrial fibrillation) ([place]) 03/20/2021   Chronic diastolic CHF (congestive heart failure) ([place]) 03/20/2021   Alcohol abuse 03/20/2021   Vision loss of left eye 03/20/2021   SOB (shortness of breath) 03/20/2021   Shortness of breath 03/10/2021   Abscess of buttock 03/02/2021   Fluid overload 03/02/2021   Multifocal pneumonia 01/13/2021   Current mild episode of major depressive disorder without prior episode ([place]) 04/09/2020   Atrial fibrillation with RVR ([place]) 04/04/2020   Non-compliance with renal dialysis ([place]) 03/21/2020   Acute CHF (congestive heart failure) ([place]) 03/12/2020   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 ([place]) 02/03/2020   Acute on chronic combined systolic and diastolic CHF (congestive heart failure) ([place]) 02/03/2020  Acute on chronic respiratory failure with hypoxia ([facility]) 11/12/2019   Aortic atherosclerosis ([place]) 11/12/2019   Emphysema of lung ([place]) 11/12/2019   Volume overload 10/05/2019   Elevated troponin 05/17/2018   Anemia in ESRD (end-stage renal disease) ([place]) 05/17/2018   ESRD on dialysis ([place]) 05/26/2017   Essential  hypertension 05/26/2017   Moderate protein-calorie malnutrition ([place]) 08/13/2016   Secondary hyperparathyroidism of renal origin ([place]) 08/13/2016   Tobacco abuse 08/04/2016    Past Surgical History:  Procedure Laterality Date   A/V FISTULAGRAM Left 07/15/2021   Procedure: A/V FISTULAGRAM;  Surgeon: Algernon Huxley, MD;  Location: [facility];  Service: Cardiovascular;  Laterality: Left;   BASCILIC VEIN TRANSPOSITION Left 08/07/2016   Procedure: LEFT BASILIC VEIN TRANSPOSITION;  Surgeon: Angelia Mould, MD;  Location: [facility];  Service: Vascular;  Laterality: Left;   CLOSED REDUCTION NASAL FRACTURE N/A 08/11/2019   Procedure: CLOSED REDUCTION NASAL FRACTURE WITH STABILIZATION;  Surgeon: Irene Limbo, MD;  Location: [facility];  Service: Plastics;  Laterality: N/A;   COLONOSCOPY N/A 08/12/2016   Procedure: COLONOSCOPY;  Surgeon: Otis Brace, MD;  Location: [facility];  Service: Gastroenterology;  Laterality: N/A;   COLONOSCOPY WITH PROPOFOL N/A 06/05/2021   Procedure: COLONOSCOPY WITH PROPOFOL;  Surgeon: Sharyn Creamer, MD;  Location: [facility];  Service: Gastroenterology;  Laterality: N/A;   DIALYSIS/PERMA CATHETER INSERTION N/A 07/26/2021   Procedure: DIALYSIS/PERMA CATHETER INSERTION;  Surgeon: Algernon Huxley, MD;  Location: [facility];  Service: Cardiovascular;  Laterality: N/A;   ESOPHAGOGASTRODUODENOSCOPY N/A 08/12/2016   Procedure: ESOPHAGOGASTRODUODENOSCOPY (EGD);  Surgeon: Otis Brace, MD;  Location: [facility];  Service: Gastroenterology;  Laterality: N/A;   ESOPHAGOGASTRODUODENOSCOPY (EGD) WITH PROPOFOL N/A 06/05/2021   Procedure: ESOPHAGOGASTRODUODENOSCOPY (EGD) WITH PROPOFOL;  Surgeon: Sharyn Creamer, MD;  Location: [facility];  Service: Gastroenterology;  Laterality: N/A;   INSERTION OF DIALYSIS CATHETER N/A 08/07/2016   Procedure: INSERTION OF TUNNELED DIALYSIS CATHETER;  Surgeon: Angelia Mould, MD;  Location: [facility];  Service: Vascular;   Laterality: N/A;   LIGATION OF ARTERIOVENOUS  FISTULA Left 09/12/2016   Procedure: BANDING OF LEFT  ARTERIOVENOUS  FISTULA;  Surgeon: Angelia Mould, MD;  Location: [facility];  Service: Vascular;  Laterality: Left;   LIGATION OF ARTERIOVENOUS  FISTULA Left 07/17/2021   Procedure: LIGATION OF ARTERIOVENOUS  FISTULA;  Surgeon: Katha Cabal, MD;  Location: [facility];  Service: Vascular;  Laterality: Left;  also include placement of temporary dialysis catheter right or left groin   LOWER EXTREMITY INTERVENTION Right 12/02/2018   Procedure: LOWER EXTREMITY INTERVENTION;  Surgeon: Algernon Huxley, MD;  Location: [facility];  Service: Cardiovascular;  Laterality: Right;   POLYPECTOMY  06/05/2021   Procedure: POLYPECTOMY;  Surgeon: Sharyn Creamer, MD;  Location: [facility];  Service: Gastroenterology;;   TEMPORARY DIALYSIS CATHETER Left 07/17/2021   Procedure: TEMPORARY DIALYSIS CATHETER;  Surgeon: Katha Cabal, MD;  Location: [facility];  Service: Vascular;  Laterality: Left;       Family History  Problem Relation Age of Onset   Diabetes Mother    Kidney failure Mother    Healthy Father    Kidney failure Brother    Healthy Sister    Kidney disease Daughter    Post-traumatic stress disorder Neg Hx    Bladder Cancer Neg Hx    Kidney cancer Neg Hx     Social History   Tobacco Use   Smoking status: Every Day    Packs/day:  0.50    Years: 40.00    Pack years: 20.00    Types: Cigarettes   Smokeless tobacco: Never  Vaping Use   Vaping Use: Never used  Substance Use Topics   Alcohol use: Not Currently    Alcohol/week: 21.0 standard drinks    Types: 21 Cans of beer per week    Comment: last drink 6 months ago   Drug use: No    Home Medications Prior to Admission medications   Medication Sig Start Date End Date Taking? Authorizing Provider  albuterol (VENTOLIN HFA) 108 (90 Base) MCG/ACT inhaler Inhale 2 puffs into the lungs every 4 (four) hours as needed for  wheezing or shortness of breath. 06/05/21   Danford, Suann Larry, MD  calcium acetate (PHOSLO) 667 MG capsule Take 2 capsules (1,334 mg total) by mouth 3 (three) times daily with meals. 06/05/21   Danford, Suann Larry, MD  cinacalcet (SENSIPAR) 30 MG tablet Take 1 tablet (30 mg total) by mouth daily with breakfast. 06/05/21   Danford, Suann Larry, MD  docusate sodium (COLACE) 100 MG capsule Take 1 capsule (100 mg total) by mouth 2 (two) times daily. 06/05/21   Edwin Dada, MD  epoetin alfa (EPOGEN) 4000 UNIT/ML injection Inject 1 mL (4,000 Units total) into the vein every Monday, Wednesday, and Friday with hemodialysis. 06/22/20   Enzo Bi, MD  ferrous sulfate 325 (65 FE) MG tablet Take 1 tablet (325 mg total) by mouth 2 (two) times daily with a meal. 06/05/21   Danford, Suann Larry, MD  metoprolol tartrate (LOPRESSOR) 50 MG tablet Take 1 tablet (50 mg total) by mouth 2 (two) times daily. 07/04/21   Jennye Boroughs, MD  pantoprazole (PROTONIX) 20 MG tablet Take 1 tablet (20 mg total) by mouth 2 (two) times daily before a meal. 06/05/21 08/07/21  Danford, Suann Larry, MD    Allergies    Lisinopril  Review of Systems   Review of Systems  Constitutional:  Negative for fever.  HENT: Negative.    Eyes: Negative.   Respiratory:  Positive for cough. Negative for shortness of breath.   Cardiovascular: Negative.   Gastrointestinal:  Negative for abdominal pain and vomiting.  Endocrine: Negative.   Genitourinary: Negative.   Musculoskeletal: Negative.   Skin:  Positive for wound. Negative for rash.  Neurological:  Negative for headaches.  All other systems reviewed and are negative.  Physical Exam Updated Vital Signs BP (!) 154/104   Pulse (!) 101   Temp 98 F (36.7 C) (Oral)   Resp (!) 24   Ht 5\' 3"  (1.6 m)   Wt 74.4 kg   SpO2 93%   BMI 29.05 kg/m   Physical Exam Vitals and nursing note reviewed.  Constitutional:      General: He is not in acute distress.     Appearance: He is ill-appearing.  HENT:     Head: Atraumatic.  Eyes:     Conjunctiva/sclera: Conjunctivae normal.  Cardiovascular:     Rate and Rhythm: Regular rhythm. Tachycardia present.     Pulses: Normal pulses.     Heart sounds: No murmur heard. Pulmonary:     Effort: Pulmonary effort is normal. No respiratory distress.     Breath sounds: Normal breath sounds.     Comments: Distant breath sounds bilaterally Abdominal:     General: Abdomen is protuberant. There is distension.     Palpations: Abdomen is soft.     Tenderness: There is no abdominal tenderness.     Comments:  Abdomen is distended with fluid   Musculoskeletal:        General: Normal range of motion.       Arms:     Cervical back: Normal range of motion.     Comments: Patient has dehisced wound from AVF removal on the left upper arm.  Skin:    General: Skin is warm and dry.     Capillary Refill: Capillary refill takes less than 2 seconds.     Findings: Wound present.          Comments: Long poorly healing dehisced wound on the left upper arm secondary to known infected AVF removal.  Serous drainage.  Area not particularly warm to touch.  No active bleeding  Neurological:     General: No focal deficit present.     Mental Status: He is alert.  Psychiatric:        Mood and Affect: Mood normal.    ED Results / Procedures / Treatments   Labs (all labs ordered are listed, but only abnormal results are displayed) Labs Reviewed  COMPREHENSIVE METABOLIC PANEL - Abnormal; Notable for the following components:      Result Value   Potassium 5.5 (*)    BUN 46 (*)    Creatinine, Ser 9.15 (*)    Albumin 3.3 (*)    GFR, Estimated 6 (*)    Anion gap 16 (*)    All other components within normal limits  CBC WITH DIFFERENTIAL/PLATELET - Abnormal; Notable for the following components:   RBC 2.49 (*)    Hemoglobin 7.7 (*)    HCT 25.4 (*)    MCV 102.0 (*)    RDW 18.2 (*)    Abs Immature Granulocytes 0.10 (*)    All  other components within normal limits  PROTIME-INR - Abnormal; Notable for the following components:   Prothrombin Time 16.3 (*)    INR 1.3 (*)    All other components within normal limits  CULTURE, BLOOD (ROUTINE X 2)  CULTURE, BLOOD (ROUTINE X 2)  URINE CULTURE  RESP PANEL BY RT-PCR (FLU A&B, COVID) ARPGX2  LACTIC ACID, PLASMA  APTT  LACTIC ACID, PLASMA  URINALYSIS, ROUTINE W REFLEX MICROSCOPIC    EKG None  Radiology DG Chest Port 1 View  Result Date: 08/10/2021 CLINICAL DATA:  Questionable sepsis. EXAM: PORTABLE CHEST 1 VIEW COMPARISON:  Portable chest 08/06/2021. FINDINGS: Right IJ dialysis catheter again has its tip about the cavoatrial junction. The heart is enlarged. There is mild perihilar vascular congestion, mild perihilar interstitial edema and minimal pleural effusions. No focal lung consolidation is seen. The aorta is heavily calcified. Similar findings were noted previously but the patchy infiltrates of the mid and lower lung fields on the prior study are not seen today. IMPRESSION: Cardiomegaly with mild perihilar vascular congestion and central edema, with airspace infiltrates on the prior study not seen today. Electronically Signed   By: Telford Nab M.D.   On: 08/10/2021 20:24    Procedures Procedures   Medications Ordered in ED Medications - No data to display  ED Course  I have reviewed the triage vital signs and the nursing notes.  Pertinent labs & imaging results that were available during my care of the patient were reviewed by me and considered in my medical decision making (see chart for details).    MDM Rules/Calculators/A&P                         This  is a 55 year old male with known ESRD and known infection of his previous aVF site presents today after leaving AMA on 08/07/2021 for reevaluation of his wound.  EMS reports temperature of 101 F.  Temperature was 98 F in ED.  Patient has been on cefepime with his dialysis that he normally has MWF.   Patient has history of noncompliance and leaving hospital AMA.  On exam patient is an ill-appearing man who appears well above his stated age.  He has a dehisced wound from previous AV fistula site that leaks serous fluid whenever he moves his arm.  Patient has protuberant fluid distended abdomen along with some vascular congestion seen chest x-ray - relatively unchanged from x-ray 4 days ago.  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  Lactic acid and white blood cell count normal. Kidney function at patient's baseline.   Patient's wound clearly requires advanced medical or surgical attention, and given patient's history with noncompliance to appointments and follow ups, admission may be the best option for him as it is doubtful that he will return for outpatient follow up. Sophia Caccavale spoke with Dr. Flossie Buffy who agrees to admit patient for management and will ultimately decide patient's treatment plan and disposition. He was given cefepime 2g while in ED for his wound infection.     Final Clinical Impression(s) / ED Diagnoses Final diagnoses:  None    Rx / DC Orders ED Discharge Orders     None        Rodena Piety 08/10/21 2211    Dorie Rank, MD 08/11/21 1314

## 2021-08-11 DIAGNOSIS — B86 Scabies: Secondary | ICD-10-CM

## 2021-08-11 DIAGNOSIS — T8149XA Infection following a procedure, other surgical site, initial encounter: Secondary | ICD-10-CM

## 2021-08-11 LAB — BASIC METABOLIC PANEL
Anion gap: 14 (ref 5–15)
BUN: 48 mg/dL — ABNORMAL HIGH (ref 6–20)
CO2: 24 mmol/L (ref 22–32)
Calcium: 9.4 mg/dL (ref 8.9–10.3)
Chloride: 104 mmol/L (ref 98–111)
Creatinine, Ser: 9.38 mg/dL — ABNORMAL HIGH (ref 0.61–1.24)
GFR, Estimated: 6 mL/min — ABNORMAL LOW (ref >60)
Glucose, Bld: 112 mg/dL — ABNORMAL HIGH (ref 70–99)
Potassium: 6.1 mmol/L — ABNORMAL HIGH (ref 3.5–5.1)
Sodium: 142 mmol/L (ref 135–145)

## 2021-08-11 LAB — CULTURE, BLOOD (ROUTINE X 2)
Culture: NO GROWTH
Culture: NO GROWTH
Special Requests: ADEQUATE
Special Requests: ADEQUATE

## 2021-08-11 MED ORDER — CHLORHEXIDINE GLUCONATE CLOTH 2 % EX PADS: 6.0000 | MEDICATED_PAD | Freq: Every day | CUTANEOUS | Status: DC

## 2021-08-11 MED ORDER — FERROUS SULFATE 325 (65 FE) MG PO TABS: 325.0000 mg | ORAL_TABLET | Freq: Two times a day (BID) | ORAL | Status: DC

## 2021-08-11 MED ORDER — METOPROLOL TARTRATE 50 MG PO TABS
50.0000 mg | ORAL_TABLET | Freq: Two times a day (BID) | ORAL | Status: DC
  Administered 2021-08-11: 50 mg via ORAL
  Filled 2021-08-11: qty 2

## 2021-08-11 MED ORDER — SUCROFERRIC OXYHYDROXIDE 500 MG PO CHEW: 500.0000 mg | CHEWABLE_TABLET | Freq: Three times a day (TID) | ORAL | Status: DC

## 2021-08-11 MED ORDER — ALBUTEROL SULFATE (2.5 MG/3ML) 0.083% IN NEBU: 2.5000 mg | INHALATION_SOLUTION | RESPIRATORY_TRACT | Status: DC | PRN

## 2021-08-11 MED ORDER — SODIUM ZIRCONIUM CYCLOSILICATE 5 G PO PACK
5.0000 g | PACK | Freq: Once | ORAL | Status: DC
  Filled 2021-08-11: qty 1

## 2021-08-11 MED ORDER — CALCIUM ACETATE (PHOS BINDER) 667 MG PO CAPS: 1334.0000 mg | ORAL_CAPSULE | Freq: Three times a day (TID) | ORAL | Status: DC

## 2021-08-11 MED ORDER — PANTOPRAZOLE SODIUM 20 MG PO TBEC
20.0000 mg | DELAYED_RELEASE_TABLET | Freq: Two times a day (BID) | ORAL | Status: DC
  Filled 2021-08-11 (×2): qty 1

## 2021-08-11 MED ORDER — CALCITRIOL 0.5 MCG PO CAPS: 1.5000 ug | ORAL_CAPSULE | Freq: Every day | ORAL | Status: DC

## 2021-08-11 MED ORDER — CINACALCET HCL 30 MG PO TABS: 30.0000 mg | ORAL_TABLET | Freq: Every day | ORAL | Status: DC

## 2021-08-11 MED ORDER — DARBEPOETIN ALFA 150 MCG/0.3ML IJ SOSY: 150.0000 ug | PREFILLED_SYRINGE | Freq: Once | INTRAMUSCULAR | Status: DC

## 2021-08-11 MED ORDER — DOCUSATE SODIUM 100 MG PO CAPS: 100.0000 mg | ORAL_CAPSULE | Freq: Two times a day (BID) | ORAL | Status: DC

## 2021-08-11 NOTE — Progress Notes (Signed)
PROGRESS NOTE    Jordan [name]  ZWC:[id number] DOB: 06-May-1966 DOA: 08/10/2021 PCP: Hoy Register, MD    No chief complaint on file.   Brief Narrative:  Jordan [name] is a 55 y.o. 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  Later found to have fistula infection with Serratia bacteremia and had removal of infected AV fistula by vascular surgery on 10/26.Started on IV cefepime but left AMA.  Had perm cath placed for access by vascular on 11/4.   Readmitted again on 11/9 with acute hypoxic respiratory failure from missed dialysis and also planned debridement and placement of wound VAC on AV graft wound.  However left AMA again the following day.   11/16- admission at [facility] for worsening wound infection and left AMA once again    Now here with again worsening drainage and pain from wound. Reports last dialysis yesterday but unable to finish full session due to pruritic skin. He is unsure whether he has been receiving his IV cefepime with dialysis.  Also notes chills and nausea.  Has been having abdominal distention with diffuse pain.  Some mild diarrhea  Subjective:  Patient is seen  at the dialysis unit He reports drainage from left arm wound, denies pain , no fevr Ab is distended, denies ab pain Reports lives by himself, drives and go out to eat  Assessment & Plan:   Principal Problem:   Cellulitis, wound, post-operative Active Problems:   ESRD on dialysis ([place])   Acute on chronic combined systolic and diastolic CHF  (congestive heart failure) (H[facility])   COPD (chronic obstructive pulmonary disease) ([place])   Hyperkalemia   Scabies   Left AV fistula wound dehiscence/infection S/p removal of infected AV fistula on 10/20 with Serratia bacteremia -Previously receiving IV cefepime with dialysis.  Continue antibiotics here. Case discussed with  ID Dr Candiss Norse who will see patient in consult  Also will benefit from vascular surgery consult.  There has been plans for debridement in past admissions but patient  repeatedly left AMA before procedure can be done   Hyperkalemia -K of 5.5. No EKG changes. -give Lokelma  Got HD   ESRD on HD MWF Fluid overload/Ascites -Reports last dialysis Friday but did not complete full session due to complaints of pruritus - Needs nephrology consult for HD   Chronic diastolic and systolic heart failure with possible mild CHF exacerbation -Chest x-ray showing mild vascular congestion.  Also having increased abdominal distention and dyspnea -Did not complete full dialysis session yesterday. nephrology consulted for dialysis   Ascites, abdominal distention, though denies ab pain, does not appear to have significant symptom   Paroxysmal atrial fibrillation - Not on systemic coagulation - Continue metoprolol for rate control   COPD -has PRN 2.5L  -Not in acute exacerbation   Suspected crusted scabies -daily permethrin cream applied to entire body leave on for 8 hours before removal with showering   History of noncompliance with medical treatment - Patient has left AMA in the last 3 hospitalization before his AV fistula wound could be fully treated  Nutritional Assessment:  The patient's BMI is: Body mass index is 29.05 kg/m.Marland Kitchen  Seen by dietician.  I agree with the assessment and plan as outlined below:  Nutrition Status:        .     Skin Assessment:  I have examined the patient's skin and I agree with the wound assessment as performed by the wound care RN  as outlined below:  Pressure Injury 07/14/21 Buttocks Mid Stage 2 -  Partial thickness loss of dermis presenting as a shallow open injury with a red, pink wound bed without slough. open area (Active)  07/14/21 1402  Location: Buttocks  Location Orientation: Mid  Staging: Stage 2 -  Partial thickness loss of dermis presenting as a shallow open injury with a red, pink wound bed without slough.  Wound Description (Comments): open area  Present on Admission: Yes    Unresulted Labs (From admission, onward)     Start     Ordered   08/10/21 1931  Blood Culture (routine x 2)  (Undifferentiated presentation (screening labs and basic nursing orders))  BLOOD CULTURE X 2,   STAT      08/10/21 1931   08/10/21 1931  Urinalysis, Routine w reflex microscopic  (Undifferentiated presentation (screening labs and basic nursing orders))  ONCE - STAT,   STAT        08/10/21 1931   08/10/21 1931  Urine Culture  (Undifferentiated presentation (screening labs and basic nursing orders))  ONCE - STAT,   STAT       Question:  Indication  Answer:  Sepsis   08/10/21 1931   Signed and Held  Hepatitis B surface antigen  (New Admission [facility] Labs (Hepatitis B))  Once,   R        Signed and Held   Signed and Held  Hepatitis B surface antibody  (New Admission [facility] Labs (Hepatitis B))  Once,   R        Signed and Held   Signed and Held  Hepatitis B surface antibody,quantitative  (New Admission Hemo Labs (Hepatitis B))  Once,   R        Signed and Held              DVT prophylaxis: SCDs Start: 08/10/21 2252   Code Status:full Family Communication: patient Disposition:   Status is: Inpatient  Dispo: The patient is from: home              Anticipated d/c is to: TBD              Anticipated d/c date is: TBD                Consultants:  Nephrology ID  Procedures:  HD  Antimicrobials:    Anti-infectives (From admission, onward)    Start     Dose/Rate Route Frequency Ordered Stop   08/10/21 2145   ceFEPIme (MAXIPIME) 2 g in sodium chloride 0.9 % 100 mL IVPB        2 g 200 mL/hr over 30 Minutes Intravenous  Once 08/10/21 2144 08/10/21 2308          Objective: Vitals:   08/11/21 1000 08/11/21 1030 08/11/21 1100 08/11/21 1150  BP: (!) 167/77 (!) 121/93 (!) 157/84 (!) 151/99  Pulse:      Resp: (!) 25 (!) 21 (!) 22 (!) 22  Temp:    97.6 F (36.4 C)  TempSrc:    Oral  SpO2:    96%  Weight:  Height:        Intake/Output Summary (Last 24 hours) at 08/11/2021 1235 Last data filed at 08/11/2021 1130 Gross per 24 hour  Intake --  Output 2000 ml  Net -2000 ml   Filed Weights   08/10/21 1816  Weight: 74.4 kg    Examination:  General exam: alert, awake, communicative,calm, NAD, chronically ill appearing Respiratory system: diminished at bases, no wheezing, no rales, no rhonchi. Respiratory effort normal. Cardiovascular system:  RRR.  Gastrointestinal system: Abdomen distended, though not tight , nontender.  Normal bowel sounds heard. Central nervous system: Alert and oriented.x3, No focal neurological deficits. Extremities:  no edema Skin: No rashes, lesions or ulcers Psychiatry: Judgement and insight appear normal. Mood & affect appropriate.     Data Reviewed: I have personally reviewed following labs and imaging studies  CBC: Recent Labs  Lab 08/06/21 2300 08/07/21 0706 08/10/21 1931  WBC 7.3 6.6 8.6  NEUTROABS 5.3 4.6 6.4  HGB 7.4* 6.7* 7.7*  HCT 23.7* 21.5* 25.4*  MCV 99.2 96.4 102.0*  PLT 195 168 627    Basic Metabolic Panel: Recent Labs  Lab 08/06/21 2300 08/10/21 1931 08/11/21 0305  NA 143 143 142  K 4.5 5.5* 6.1*  CL 101 101 104  CO2 27 26 24   GLUCOSE 81 91 112*  BUN 51* 46* 48*  CREATININE 9.76* 9.15* 9.38*  CALCIUM 9.2 9.9 9.4    GFR: Estimated Creatinine Clearance: 8.2 mL/min (A) (by C-G formula based on SCr of 9.38 mg/dL (H)).  Liver Function Tests: Recent Labs  Lab 08/06/21 2300 08/10/21 1931  AST 17 18  ALT 9 12   ALKPHOS 102 121  BILITOT 1.1 1.0  PROT 7.7 7.6  ALBUMIN 3.2* 3.3*    CBG: No results for input(s): GLUCAP in the last 168 hours.   Recent Results (from the past 240 hour(s))  Aerobic culture     Status: None   Collection Time: 08/02/21 11:08 AM  Result Value Ref Range Status   Aerobic Bacterial Culture Final report  Final   Organism ID, Bacteria Comment  Final    Comment: Mixed skin flora including multiple gram negative rods. Scant growth   Blood culture (routine x 2)     Status: None   Collection Time: 08/06/21 11:22 PM   Specimen: BLOOD  Result Value Ref Range Status   Specimen Description BLOOD RIGHT ASSIST CONTROL  Final   Special Requests   Final    BOTTLES DRAWN AEROBIC AND ANAEROBIC Blood Culture adequate volume   Culture   Final    NO GROWTH 5 DAYS Performed at [facility], [address] [place], [place] [address]    Report Status 08/11/2021 FINAL  Final  Blood culture (routine x 2)     Status: None   Collection Time: 08/06/21 11:22 PM   Specimen: BLOOD  Result Value Ref Range Status   Specimen Description BLOOD LEFT ASSIST CONTROL  Final   Special Requests   Final    BOTTLES DRAWN AEROBIC AND ANAEROBIC Blood Culture adequate volume   Culture   Final    NO GROWTH 5 DAYS Performed at [facility], [address] [place], [place] [address]    Report Status 08/11/2021 FINAL  Final  Resp Panel by RT-PCR (Flu A&B, Covid) Nasopharyngeal Swab     Status: None   Collection Time: 08/10/21  9:01 PM   Specimen: Nasopharyngeal Swab; Nasopharyngeal(NP) swabs in vial transport medium  Result Value Ref Range Status  SARS Coronavirus 2 by RT PCR NEGATIVE NEGATIVE Final    Comment: (NOTE) SARS-CoV-2 target nucleic acids are NOT DETECTED.  The SARS-CoV-2 RNA is generally detectable in upper respiratory specimens during the acute phase of infection. The lowest concentration of SARS-CoV-2 viral copies this assay can detect is 138 copies/mL. A  negative result does not preclude SARS-Cov-2 infection and should not be used as the sole basis for treatment or other patient management decisions. A negative result may occur with  improper specimen collection/handling, submission of specimen other than nasopharyngeal swab, presence of viral mutation(s) within the areas targeted by this assay, and inadequate number of viral copies(<138 copies/mL). A negative result must be combined with clinical observations, patient history, and epidemiological information. The expected result is Negative.  Fact Sheet for Patients:  EntrepreneurPulse.com.au  Fact Sheet for Healthcare Providers:  IncredibleEmployment.be  This test is no t yet approved or cleared by the [place] FDA and  has been authorized for detection and/or diagnosis of SARS-CoV-2 by FDA under an Emergency Use Authorization (EUA). This EUA will remain  in effect (meaning this test can be used) for the duration of the COVID-19 declaration under Section 564(b)(1) of the Act, 21 U.S.C.section 360bbb-3(b)(1), unless the authorization is terminated  or revoked sooner.       Influenza A by PCR NEGATIVE NEGATIVE Final   Influenza B by PCR NEGATIVE NEGATIVE Final    Comment: (NOTE) The Xpert Xpress SARS-CoV-2/FLU/RSV plus assay is intended as an aid in the diagnosis of influenza from Nasopharyngeal swab specimens and should not be used as a sole basis for treatment. Nasal washings and aspirates are unacceptable for Xpert Xpress SARS-CoV-2/FLU/RSV testing.  Fact Sheet for Patients: EntrepreneurPulse.com.au  Fact Sheet for Healthcare Providers: IncredibleEmployment.be  This test is not yet approved or cleared by the [place] FDA and has been authorized for detection and/or diagnosis of SARS-CoV-2 by FDA under an Emergency Use Authorization (EUA). This EUA will remain in effect (meaning this test can  be used) for the duration of the COVID-19 declaration under Section 564(b)(1) of the Act, 21 U.S.C. section 360bbb-3(b)(1), unless the authorization is terminated or revoked.  Performed at [facility], [place] [address] [place], [place] [address]          Radiology Studies: DG Abd 1 View  Result Date: 08/10/2021 CLINICAL DATA:  Abdominal pain, fever EXAM: ABDOMEN - 1 VIEW COMPARISON:  07/03/2021 FINDINGS: Supine frontal view of the abdomen and pelvis excludes the right hemidiaphragm and bilateral flanks by collimation. Centralization of gas-filled bowel within the central abdomen may reflect underlying ascites. No evidence of bowel obstruction or ileus. No masses or abnormal calcifications. No acute bony abnormalities. IMPRESSION: 1. No bowel obstruction or ileus. 2. Centralization of gas-filled loops of bowel within the central abdomen may signify underlying ascites. Electronically Signed   By: Randa Ngo M.D.   On: 08/10/2021 23:24   DG Chest Port 1 View  Result Date: 08/10/2021 CLINICAL DATA:  Questionable sepsis. EXAM: PORTABLE CHEST 1 VIEW COMPARISON:  Portable chest 08/06/2021. FINDINGS: Right IJ dialysis catheter again has its tip about the cavoatrial junction. The heart is enlarged. There is mild perihilar vascular congestion, mild perihilar interstitial edema and minimal pleural effusions. No focal lung consolidation is seen. The aorta is heavily calcified. Similar findings were noted previously but the patchy infiltrates of the mid and lower lung fields on the prior study are not seen today. IMPRESSION: Cardiomegaly with mild perihilar vascular congestion and central edema,  with airspace infiltrates on the prior study not seen today. Electronically Signed   By: Telford Nab M.D.   On: 08/10/2021 20:24        Scheduled Meds:  calcitRIOL  1.5 mcg Oral Daily   calcium acetate  1,334 mg Oral TID WC   Chlorhexidine Gluconate Cloth  6 each Topical Q0600   cinacalcet   30 mg Oral Q breakfast   darbepoetin (ARANESP) injection - DIALYSIS  150 mcg Intravenous Once   docusate sodium  100 mg Oral BID   ferrous sulfate  325 mg Oral BID WC   metoprolol tartrate  50 mg Oral BID   pantoprazole  20 mg Oral BID AC   permethrin   Topical Daily   sodium zirconium cyclosilicate  5 g Oral Once   sucroferric oxyhydroxide  500 mg Oral TID WC   Continuous Infusions:   LOS: 1 day   Time spent: 21mins Greater than 50% of this time was spent in counseling, explanation of diagnosis, planning of further management, and coordination of care.   Voice Recognition Viviann Spare dictation system was used to create this note, attempts have been made to correct errors. Please contact the author with questions and/or clarifications.   Florencia Reasons, MD PhD FACP [facility]  Available via Epic secure chat 7am-7pm for nonurgent issues Please page for urgent issues To page the attending provider between 7A-7P or the covering provider during after hours 7P-7A, please log into the web site [email or web address] and access using universal  password for that web site. If you do not have the password, please call the hospital operator.    08/11/2021, 12:35 PM

## 2021-08-11 NOTE — Progress Notes (Addendum)
Patient arrived to floor from dialysis and immediately said was not staying, wanted to leave.  Paged provider, gave AMA paperwork to patient to sign.  Refused to have any assessments or vitals.

## 2021-08-11 NOTE — Plan of Care (Signed)
CHANGE IN DIALYSIS SCHEDULE DUE TO Jordan [name]  Jordan [name] [birth year] [id number]  Patient dialysis schedule for the week of 08/11/21-08/18/21 varies from their normal schedule due to Thanksgiving Holiday.  Unfortunately due to unforeseen circumstances schedule while admitted to [facility] will be different from outpatient dialysis schedule.  Need to keep in mind for discharge planning.  Both schedules are as follows:  Normal HD Schedule: Monday Wednesday Friday Inpatient HD schedule at [facility]: Sunday, Tuesday, Friday Outpatient HD schedule: Monday, Wednesday, Saturday  They will resume their normal schedule on 08/19/21.    Jen Mow, PA-C [facility] Pager: [phone]

## 2021-08-11 NOTE — Discharge Summary (Signed)
Discharge Summary  Jordan [name] HXT:[id number] DOB: 1965-12-06  PCP: Hoy Register, MD  Admit date: 08/10/2021 Discharge date: 08/11/2021  Patient left hospital against medical advise.   He is advised to stay in the hospital, as he needs specialty care including ID/vascular surgery /nephrology . He is advised he could die from untreated infection, he expressed understanding, but he does not want to stay in the hospital to continue receive treatment, he signed out [place] again  H/o signing out AMA repeatedly  Unresulted Labs (From admission, onward)     Start     Ordered   08/10/21 1931  Blood Culture (routine x 2)  (Undifferentiated presentation (screening labs and basic nursing orders))  BLOOD CULTURE X 2,   STAT      08/10/21 1931   08/10/21 1931  Urinalysis, Routine w reflex microscopic  (Undifferentiated presentation (screening labs and basic nursing orders))  ONCE - STAT,   STAT        08/10/21 1931   08/10/21 1931  Urine Culture  (Undifferentiated presentation (screening labs and basic nursing orders))  ONCE - STAT,   STAT       Question:  Indication  Answer:  Sepsis   08/10/21 1931   Signed and Held  Hepatitis B surface antigen  (New Admission [facility] (Hepatitis B))  Once,   R        Signed and Held   Signed and Held  Hepatitis B surface antibody  (New Admission [facility] (Hepatitis B))  Once,   R        Signed and Held   Signed and Held  Hepatitis B surface antibody,quantitative  (New Admission [facility] (Hepatitis B))  Once,   R        Signed and Held             Discharge Diagnoses:  Active Hospital Problems   Diagnosis Date Noted   Cellulitis, wound, post-operative 08/10/2021   Scabies 08/11/2021   Hyperkalemia 05/28/2021   COPD (chronic obstructive pulmonary disease) ([place]) 04/08/2021   Acute on chronic combined systolic and diastolic CHF (congestive heart failure) ([place]) 02/03/2020   ESRD on dialysis [facility]) 05/26/2017    Resolved Hospital Problems   No resolved problems to display.    Discharge Condition: stable  Diet recommendation: renal diet  Filed Weights   08/10/21 1816  Weight: 74.4 kg    History of present illness:  Jordan [name] is a 55 y.o. 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  Later found to have fistula infection with Serratia bacteremia and had removal of infected AV fistula by vascular surgery on 10/26.Started on IV cefepime but left AMA.  Had perm cath placed for access by vascular on 11/4.   Readmitted again on 11/9 with acute hypoxic respiratory failure from missed dialysis and also planned debridement and placement of wound VAC on AV graft wound.  However left AMA again the following day.   11/16- admission at [facility] for worsening wound infection and left AMA once again    Now here  with again worsening drainage and pain from wound. Reports last dialysis yesterday but unable to finish full session due to pruritic skin. He is unsure whether he has been receiving his IV cefepime with dialysis.  Also notes chills and nausea.  Has been having abdominal distention with diffuse pain.  Some mild diarrhea   ED Course: He was afebrile, tachycardic and tachypneic requiring home 2.5 L.  No leukocytosis, hemoglobin of 7.7 with a baseline around 69. Sodium 143, K of 5.5, creatinine of 9.15 and anion gap of 16.      Hospital Course:  Principal Problem:   Cellulitis, wound, post-operative Active Problems:   ESRD on dialysis ([place])   Acute on chronic combined  systolic and diastolic CHF (congestive heart failure) ([facility])   COPD (chronic obstructive pulmonary disease) ([facility])   Hyperkalemia   Scabies   Procedures: HD  Consultations: ID nephrology  Discharge Exam: BP (!) 151/99 (BP Location: Right Wrist)   Pulse 79   Temp 97.6 F (36.4 C) (Oral)   Resp (!) 22   Ht 5\' 3"  (1.6 m)   Wt 74.4 kg   SpO2 96%   BMI 29.05 kg/m   General: NAD, AAOX3  Discharge Instructions You were cared for by a hospitalist during your hospital stay. If you have any questions about your discharge medications or the care you received while you were in the hospital after you are discharged, you can call the unit and asked to speak with the hospitalist on call if the hospitalist that took care of you is not available. Once you are discharged, your primary care physician will handle any further medical issues. Please note that NO REFILLS for any discharge medications will be authorized once you are discharged, as it is imperative that you return to your primary care physician (or establish a relationship with a primary care physician if you do not have one) for your aftercare needs so that they can reassess your need for medications and monitor your lab values.   Allergies as of 08/11/2021       Reactions   Lisinopril Swelling         Allergies  Allergen Reactions   Lisinopril Swelling      The results of significant diagnostics from this hospitalization (including imaging, microbiology, ancillary and laboratory) are listed below for reference.    Significant Diagnostic Studies: DG Abd 1 View  Result Date: 08/10/2021 CLINICAL DATA:  Abdominal pain, fever EXAM: ABDOMEN - 1 VIEW COMPARISON:  07/03/2021 FINDINGS: Supine frontal view of the abdomen and pelvis excludes the right hemidiaphragm and bilateral flanks by collimation. Centralization of gas-filled bowel within the central abdomen may reflect underlying ascites. No evidence of bowel obstruction or  ileus. No masses or abnormal calcifications. No acute bony abnormalities. IMPRESSION: 1. No bowel obstruction or ileus. 2. Centralization of gas-filled loops of bowel within the central abdomen may signify underlying ascites. Electronically Signed   By: Randa Ngo M.D.   On: 08/10/2021 23:24   PERIPHERAL VASCULAR CATHETERIZATION  Result Date: 07/26/2021 See surgical note for result.  PERIPHERAL VASCULAR CATHETERIZATION  Result Date: 07/15/2021 See surgical note for result.  [place] Upper Ext Art Left  Result Date: 07/12/2021 CLINICAL DATA:  Left upper extremity AV fistula for hemodialysis access. Absent rule with tenderness. Evaluate for thrombosis EXAM: LEFT UPPER EXTREMITY ARTERIAL DUPLEX SCAN TECHNIQUE: Gray-scale sonography as well as color Doppler and duplex ultrasound was performed to evaluate the hemodialysis AV fistula. COMPARISON:  None. FINDINGS: Targeted interrogation of the  patient's arteriovenous fistula at the elbow. The fistula is thrombosed with near occlusive clot. There is no internal blood flow. No focal fluid collection is seen. The remainder of the arteries in the left upper extremity were not interrogated. IMPRESSION: Thrombosed AV fistula in the left arm. These results were called by telephone at the time of interpretation on 07/12/2021 at 7:40 pm to provider PHILLIP STAFFORD , who verbally acknowledged these results. Electronically Signed   By: Keith Rake M.D.   On: 07/12/2021 19:41   DG Chest Port 1 View  Result Date: 08/10/2021 CLINICAL DATA:  Questionable sepsis. EXAM: PORTABLE CHEST 1 VIEW COMPARISON:  Portable chest 08/06/2021. FINDINGS: Right IJ dialysis catheter again has its tip about the cavoatrial junction. The heart is enlarged. There is mild perihilar vascular congestion, mild perihilar interstitial edema and minimal pleural effusions. No focal lung consolidation is seen. The aorta is heavily calcified. Similar findings were noted previously but the patchy  infiltrates of the mid and lower lung fields on the prior study are not seen today. IMPRESSION: Cardiomegaly with mild perihilar vascular congestion and central edema, with airspace infiltrates on the prior study not seen today. Electronically Signed   By: Telford Nab M.D.   On: 08/10/2021 20:24   DG Chest Portable 1 View  Result Date: 08/06/2021 CLINICAL DATA:  Shortness of breath EXAM: PORTABLE CHEST 1 VIEW COMPARISON:  07/30/2021 FINDINGS: Cardiac shadow is enlarged but stable. Dialysis catheter is again seen and stable. Aortic calcifications are noted. Mild vascular congestion is noted although improved when compared with the prior exam. No bony abnormality is seen. IMPRESSION: Mild vascular congestion likely related to fluid overload. No acute abnormality noted. Electronically Signed   By: Inez Catalina M.D.   On: 08/06/2021 22:01   DG Chest Portable 1 View  Addendum Date: 07/30/2021   ADDENDUM REPORT: 07/30/2021 15:31 ADDENDUM: ADDENDUM PLEASE NOTE INCORRECT DICTATION PRIOR. CORRECT COMPARISON: Chest x-ray 07/23/2021 CORRECT FINDINGS: CORRECT FINDINGS Right chest wall dialysis catheter with tip overlying the right atrium. Cardiomegaly. The heart and mediastinal contours are unchanged. Aortic calcification. No diffuse patchy airspace and interstitial opacities. No pleural effusion. No pneumothorax. No acute osseous abnormality. CORRECT IMPRESSION: Cardiomegaly with pulmonary edema. No pleural effusions. Underlying pericardial effusion is not excluded. Electronically Signed   By: Iven Finn M.D.   On: 07/30/2021 15:31   Result Date: 07/30/2021 CLINICAL DATA:  Chest pain EXAM: PORTABLE CHEST 1 VIEW COMPARISON:  None. FINDINGS: The heart and mediastinal contours are within normal limits. Vascular clips in sternotomy wires overlie the mediastinum. No focal consolidation. No pulmonary edema. No pleural effusion. No pneumothorax. No acute osseous abnormality. IMPRESSION: No active disease.  Electronically Signed: By: Iven Finn M.D. On: 07/30/2021 15:25   DG Chest Portable 1 View  Result Date: 07/23/2021 CLINICAL DATA:  55 year old male status post dialysis fistula resection yesterday, wound seeping blood. EXAM: PORTABLE CHEST 1 VIEW COMPARISON:  Portable chest 07/20/2021 and earlier. FINDINGS: Portable AP upright view at 0524 hours. Left IJ dialysis catheter removed. Stable cardiomegaly. Calcified aortic atherosclerosis. Visualized tracheal air column is within normal limits. Somewhat low lung volumes with decreased pulmonary vascularity since last month. No overt edema. No pneumothorax, pleural effusion or consolidation. No acute osseous abnormality identified. Negative visible bowel gas. IMPRESSION: 1. Left IJ dialysis catheter removed. 2. Cardiomegaly with interval regression of pulmonary vascular congestion. No overt edema. 3.  Aortic Atherosclerosis (ICD10-I70.0). Electronically Signed   By: Genevie Ann M.D.   On: 07/23/2021 06:10   DG  Chest Port 1 View  Result Date: 07/20/2021 CLINICAL DATA:  Central line placement. EXAM: PORTABLE CHEST 1 VIEW COMPARISON:  Radiograph 07/17/2021.  CT 05/03/2021 FINDINGS: Interval extubation. Left internal jugular central venous catheter tip projects over the upper SVC. Unchanged enlarged cardiopericardial silhouette. Advanced aortic atherosclerosis. Development of pulmonary edema from prior exam. There may be trace left pleural effusion. No pneumothorax. IMPRESSION: 1. Left internal jugular central venous catheter with tip over the upper SVC. No pneumothorax. 2. Unchanged cardiomegaly/enlargement of the cardiopericardial silhouette. Development of pulmonary edema from prior exam. Electronically Signed   By: Keith Rake M.D.   On: 07/20/2021 15:50   DG Chest Port 1 View  Result Date: 07/17/2021 CLINICAL DATA:  Respiratory failure EXAM: PORTABLE CHEST 1 VIEW COMPARISON:  Chest radiograph dated 07/17/2021. FINDINGS: Endotracheal tube with tip  approximately 2.5 cm above the carina. There is cardiomegaly with vascular congestion. No focal consolidation, pleural effusion or pneumothorax. Atherosclerotic calcification of the aorta. No acute osseous pathology. IMPRESSION: Cardiomegaly with vascular congestion. No focal consolidation. Electronically Signed   By: Anner Crete M.D.   On: 07/17/2021 20:47   DG Chest Port 1 View  Result Date: 07/17/2021 CLINICAL DATA:  Bleeding of left arm dialysis fistula. EXAM: PORTABLE CHEST 1 VIEW COMPARISON:  July 15, 2021 FINDINGS: Mild, stable diffusely increased interstitial lung markings are seen. There is no evidence of a pleural effusion or pneumothorax. The cardiac silhouette is markedly enlarged and unchanged in size. Marked severity calcification of the thoracic aorta is noted. The visualized skeletal structures are unremarkable. IMPRESSION: 1. Stable cardiomegaly. 2. Mild, stable, diffusely increased interstitial lung markings, which are likely, in part, chronic in nature. A mild superimposed component of interstitial edema cannot be excluded. Electronically Signed   By: Virgina Norfolk M.D.   On: 07/17/2021 00:42   DG Chest Port 1 View  Result Date: 07/15/2021 CLINICAL DATA:  Chest pain EXAM: PORTABLE CHEST 1 VIEW COMPARISON:  07/12/2021, CT 05/03/2021, chest x-ray 06/14/2021, 05/28/2021, 03/20/2021, 10/01/2019 FINDINGS: Cardiomegaly with globular cardiac configuration. Aortic atherosclerosis. Diffuse bilateral ground-glass opacity. No pleural effusion or pneumothorax. IMPRESSION: 1. Cardiomegaly with globular cardiac configuration which may be due to combination of chamber enlargement and pericardial effusion. 2. Diffuse bilateral ground-glass opacity, suspect underlying component of chronic lung disease but difficult to exclude mild superimposed edema Electronically Signed   By: Donavan Foil M.D.   On: 07/15/2021 16:03   DG Chest Portable 1 View  Result Date: 07/12/2021 CLINICAL DATA:   Shortness of breath x2 days. EXAM: PORTABLE CHEST 1 VIEW COMPARISON:  July 02, 2021 (8:32 a.m.) FINDINGS: Low lung volumes are seen. Mild, diffusely increased interstitial lung markings are noted. This is stable in appearance when compared to the prior study. There is no evidence of a pleural effusion or pneumothorax. The cardiac silhouette is markedly enlarged. There is marked severity calcification of the thoracic aorta. The visualized skeletal structures are unremarkable. IMPRESSION: 1. Stable cardiomegaly with mild pulmonary edema. Electronically Signed   By: Virgina Norfolk M.D.   On: 07/12/2021 19:20   ECHOCARDIOGRAM COMPLETE  Result Date: 07/20/2021    ECHOCARDIOGRAM REPORT   Patient Name:   BRODERIC BARA Date of Exam: 07/20/2021 Medical Rec #:  [id number]        Height:       64.0 in Accession #:    [id number]       Weight:       168.2 lb Date of Birth:  22-Mar-1966  BSA:          1.818 m Patient Age:    77 years         BP:           117/74 mmHg Patient Gender: M                HR:           59 bpm. Exam Location:  [facility] Procedure: 2D Echo, Cardiac Doppler and Color Doppler Indications:     Bacteremia R78.81  History:         Patient has prior history of Echocardiogram examinations. Risk                  Factors:Hypertension.  Sonographer:     Alyse Low Roar Referring Phys:  Iva Diagnosing Phys: Skeet Latch MD IMPRESSIONS  1. Left ventricular ejection fraction, by estimation, is 45 to 50%. The left ventricle has mildly decreased function. The left ventricle has no regional wall motion abnormalities. There is mild concentric left ventricular hypertrophy. Left ventricular diastolic parameters are consistent with Grade II diastolic dysfunction (pseudonormalization). Elevated left ventricular end-diastolic pressure.  2. Right ventricular systolic function is normal. The right ventricular size is mildly enlarged. There is severely elevated pulmonary artery systolic  pressure.  3. Left atrial size was severely dilated.  4. Right atrial size was severely dilated.  5. There is no evidence of cardiac tamponade.  6. The mitral valve is degenerative. Mild to moderate mitral valve regurgitation. No evidence of mitral stenosis. Moderate mitral annular calcification.  7. Tricuspid valve regurgitation is severe.  8. The aortic valve is tricuspid. There is mild calcification of the aortic valve. There is mild thickening of the aortic valve. Aortic valve regurgitation is not visualized. No aortic stenosis is present.  9. The inferior vena cava is dilated in size with >50% respiratory variability, suggesting right atrial pressure of 8 mmHg. Comparison(s): Compared with the echo 06/03/21, the pericardial effusion is larger. There is no tamponade. FINDINGS  Left Ventricle: Left ventricular ejection fraction, by estimation, is 45 to 50%. The left ventricle has mildly decreased function. The left ventricle has no regional wall motion abnormalities. The left ventricular internal cavity size was normal in size. There is mild concentric left ventricular hypertrophy. Left ventricular diastolic parameters are consistent with Grade II diastolic dysfunction (pseudonormalization). Elevated left ventricular end-diastolic pressure. Right Ventricle: The right ventricular size is mildly enlarged. No increase in right ventricular wall thickness. Right ventricular systolic function is normal. There is severely elevated pulmonary artery systolic pressure. The tricuspid regurgitant velocity is 4.10 m/s, and with an assumed right atrial pressure of 8 mmHg, the estimated right ventricular systolic pressure is 60.7 mmHg. Left Atrium: Left atrial size was severely dilated. Right Atrium: Right atrial size was severely dilated. Pericardium: Trivial pericardial effusion is present. There is no evidence of cardiac tamponade. Mitral Valve: The mitral valve is degenerative in appearance. There is moderate thickening of  the mitral valve leaflet(s). There is moderate calcification of the mitral valve leaflet(s). Moderate mitral annular calcification. Mild to moderate mitral valve regurgitation. No evidence of mitral valve stenosis. Tricuspid Valve: The tricuspid valve is normal in structure. Tricuspid valve regurgitation is severe. No evidence of tricuspid stenosis. Aortic Valve: The aortic valve is tricuspid. There is mild calcification of the aortic valve. There is mild thickening of the aortic valve. Aortic valve regurgitation is not visualized. No aortic stenosis is present. Aortic valve peak gradient measures 8.5 mmHg. Pulmonic  Valve: The pulmonic valve was normal in structure. Pulmonic valve regurgitation is trivial. No evidence of pulmonic stenosis. Aorta: The aortic root is normal in size and structure. Venous: The inferior vena cava is dilated in size with greater than 50% respiratory variability, suggesting right atrial pressure of 8 mmHg. IAS/Shunts: No atrial level shunt detected by color flow Doppler. Additional Comments: There is pleural effusion in the right lateral region.  LEFT VENTRICLE PLAX 2D LVIDd:         4.70 cm      Diastology LVIDs:         3.70 cm      LV e' medial:    5.11 cm/s LV PW:         1.30 cm      LV E/e' medial:  23.9 LV IVS:        1.30 cm      LV e' lateral:   9.25 cm/s LVOT diam:     1.70 cm      LV E/e' lateral: 13.2 LVOT Area:     2.27 cm  LV Volumes (MOD) LV vol d, MOD A2C: 105.0 ml LV vol d, MOD A4C: 113.0 ml LV vol s, MOD A2C: 64.0 ml LV vol s, MOD A4C: 65.9 ml LV SV MOD A2C:     41.0 ml LV SV MOD A4C:     113.0 ml LV SV MOD BP:      41.7 ml RIGHT VENTRICLE RV Basal diam:  4.70 cm RV Mid diam:    3.70 cm RV S prime:     8.70 cm/s TAPSE (M-mode): 1.0 cm LEFT ATRIUM             Index        RIGHT ATRIUM           Index LA diam:        4.40 cm 2.42 cm/m   RA Area:     24.50 cm LA Vol (A2C):   88.8 ml 48.86 ml/m  RA Volume:   85.70 ml  47.15 ml/m LA Vol (A4C):   60.4 ml 33.23 ml/m LA  Biplane Vol: 73.0 ml 40.16 ml/m  AORTIC VALVE                 PULMONIC VALVE AV Area (Vmax): 1.91 cm     PV Vmax:          0.96 m/s AV Vmax:        146.00 cm/s  PV Peak grad:     3.7 mmHg AV Peak Grad:   8.5 mmHg     PR End Diast Vel: 13.25 msec LVOT Vmax:      123.00 cm/s  RVOT Peak grad:   1 mmHg  AORTA Ao Root diam: 2.90 cm MITRAL VALVE                TRICUSPID VALVE MV Area (PHT): 5.54 cm     TR Peak grad:   67.2 mmHg MV Decel Time: 137 msec     TR Vmax:        410.00 cm/s MV E velocity: 122.00 cm/s MV A velocity: 93.80 cm/s   SHUNTS MV E/A ratio:  1.30         Systemic Diam: 1.70 cm MV A Prime:    6.7 cm/s Skeet Latch MD Electronically signed by Skeet Latch MD Signature Date/Time: 07/20/2021/3:09:33 PM    Final     Microbiology: Recent Results (from the past 240 hour(s))  Aerobic culture     Status: None   Collection Time: 08/02/21 11:08 AM  Result Value Ref Range Status   Aerobic Bacterial Culture Final report  Final   Organism ID, Bacteria Comment  Final    Comment: Mixed skin flora including multiple gram negative rods. Scant growth   Blood culture (routine x 2)     Status: None   Collection Time: 08/06/21 11:22 PM   Specimen: BLOOD  Result Value Ref Range Status   Specimen Description BLOOD RIGHT ASSIST CONTROL  Final   Special Requests   Final    BOTTLES DRAWN AEROBIC AND ANAEROBIC Blood Culture adequate volume   Culture   Final    NO GROWTH 5 DAYS Performed at [facility], [address] [place], [place] [address]    Report Status 08/11/2021 FINAL  Final  Blood culture (routine x 2)     Status: None   Collection Time: 08/06/21 11:22 PM   Specimen: BLOOD  Result Value Ref Range Status   Specimen Description BLOOD LEFT ASSIST CONTROL  Final   Special Requests   Final    BOTTLES DRAWN AEROBIC AND ANAEROBIC Blood Culture adequate volume   Culture   Final    NO GROWTH 5 DAYS Performed at [facility], [address] [place], [place]  [address]    Report Status 08/11/2021 FINAL  Final  Resp Panel by RT-PCR (Flu A&B, Covid) Nasopharyngeal Swab     Status: None   Collection Time: 08/10/21  9:01 PM   Specimen: Nasopharyngeal Swab; Nasopharyngeal(NP) swabs in vial transport medium  Result Value Ref Range Status   SARS Coronavirus 2 by RT PCR NEGATIVE NEGATIVE Final    Comment: (NOTE) SARS-CoV-2 target nucleic acids are NOT DETECTED.  The SARS-CoV-2 RNA is generally detectable in upper respiratory specimens during the acute phase of infection. The lowest concentration of SARS-CoV-2 viral copies this assay can detect is 138 copies/mL. A negative result does not preclude SARS-Cov-2 infection and should not be used as the sole basis for treatment or other patient management decisions. A negative result may occur with  improper specimen collection/handling, submission of specimen other than nasopharyngeal swab, presence of viral mutation(s) within the areas targeted by this assay, and inadequate number of viral copies(<138 copies/mL). A negative result must be combined with clinical observations, patient history, and epidemiological information. The expected result is Negative.  Fact Sheet for Patients:  EntrepreneurPulse.com.au  Fact Sheet for Healthcare Providers:  IncredibleEmployment.be  This test is no t yet approved or cleared by the [place] FDA and  has been authorized for detection and/or diagnosis of SARS-CoV-2 by FDA under an Emergency Use Authorization (EUA). This EUA will remain  in effect (meaning this test can be used) for the duration of the COVID-19 declaration under Section 564(b)(1) of the Act, 21 U.S.C.section 360bbb-3(b)(1), unless the authorization is terminated  or revoked sooner.       Influenza A by PCR NEGATIVE NEGATIVE Final   Influenza B by PCR NEGATIVE NEGATIVE Final    Comment: (NOTE) The Xpert Xpress SARS-CoV-2/FLU/RSV plus assay is intended as an  aid in the diagnosis of influenza from Nasopharyngeal swab specimens and should not be used as a sole basis for treatment. Nasal washings and aspirates are unacceptable for Xpert Xpress SARS-CoV-2/FLU/RSV testing.  Fact Sheet for Patients: EntrepreneurPulse.com.au  Fact Sheet for Healthcare Providers: IncredibleEmployment.be  This test is not yet approved or cleared by the [place] and has been authorized for  detection and/or diagnosis of SARS-CoV-2 by FDA under an Emergency Use Authorization (EUA). This EUA will remain in effect (meaning this test can be used) for the duration of the COVID-19 declaration under Section 564(b)(1) of the Act, 21 U.S.C. section 360bbb-3(b)(1), unless the authorization is terminated or revoked.  Performed at [facility], [place] [address] [place], [place] [address]      Labs: Basic Metabolic Panel: Recent Labs  Lab 08/06/21 2300 08/10/21 1931 08/11/21 0305  NA 143 143 142  K 4.5 5.5* 6.1*  CL 101 101 104  CO2 27 26 24   GLUCOSE 81 91 112*  BUN 51* 46* 48*  CREATININE 9.76* 9.15* 9.38*  CALCIUM 9.2 9.9 9.4   Liver Function Tests: Recent Labs  Lab 08/06/21 2300 08/10/21 1931  AST 17 18  ALT 9 12  ALKPHOS 102 121  BILITOT 1.1 1.0  PROT 7.7 7.6  ALBUMIN 3.2* 3.3*   No results for input(s): LIPASE, AMYLASE in the last 168 hours. No results for input(s): AMMONIA in the last 168 hours. CBC: Recent Labs  Lab 08/06/21 2300 08/07/21 0706 08/10/21 1931  WBC 7.3 6.6 8.6  NEUTROABS 5.3 4.6 6.4  HGB 7.4* 6.7* 7.7*  HCT 23.7* 21.5* 25.4*  MCV 99.2 96.4 102.0*  PLT 195 168 161   Cardiac Enzymes: No results for input(s): CKTOTAL, CKMB, CKMBINDEX, TROPONINI in the last 168 hours. BNP: BNP (last 3 results) Recent Labs    06/26/21 0804 07/23/21 0500 07/31/21 0521  BNP 4,471.7* >4,500.0* >4,500.0*    ProBNP (last 3 results) No results for input(s): PROBNP in the last 8760  hours.  CBG: No results for input(s): GLUCAP in the last 168 hours.     Signed:  Florencia Reasons MD, PhD, FACP  [facility] 08/11/2021, 12:47 PM

## 2021-08-11 NOTE — Consult Note (Signed)
[facility] Renal Consultation Note    Indication for Consultation:  Management of ESRD/hemodialysis; anemia, hypertension/volume and secondary hyperparathyroidism  QVZ:DGLO, [facility], MD  HPI: Jordan [name] is a 55 y.o. male with ESRD on HD MWF at [facility].  Past medical history significant for HTN, A fib, chronic systolic and diastolic HF, COPD on 7.5I O2 prn, polysubstance abuse and chronic medical non compliance.  Of note patient has been to outpatient dialysis 4 times since April 22, 2021 for a total of almost 10 hours duration.  It appears he mostly gets dialysis in the hospital.  He has had multiple hospitalizations, most of which he has left AMA, with 3 of the most recent being due to AVG infection s/p excision on 10/26 and serratia bacteremia with IV antibiotics prescribed on discharge.  He was scheduled to have additional procedures by [facility]VS including debridement and wound vac placement but signed out AMA prior to having it completed.  Patient did not receive antibiotics as outpatient due to non compliance.   Today, he presents to the ED due to drainage and wound pain.  Last dialysis completed at [facility] on 11/16.  Seen and examined at bedside during dialysis.  Admits to pain in L arm with drainage for several days, SOB with exertion, weakness of lower extremities, nausea, diarrhea and chills.  Denies fever, abdominal pain, vomiting, cough and edema.  Admits he did not go to dialysis because he was too tired.  Pertinent findings during work up include hypertension, tachypnea, tachycardia, negative respiratory panel, abdominal xray with no acute findings, possible ascites; CXR with pulmonary edema, K 6.1, SCr 9.38 and Hgb 7.7. Patient admitted for further evaluation and management.   Past Medical History:  Diagnosis Date   Anemia    Aortic atherosclerosis ([place]) 11/12/2019   ED (erectile dysfunction)    Emphysema of lung ([place]) 11/12/2019   ESRD on hemodialysis ([place])     M/W/F in [place], [place]   ETOH abuse    History of blood transfusion    Hypertension    Wears dentures    Past Surgical History:  Procedure Laterality Date   A/V FISTULAGRAM Left 07/15/2021   Procedure: A/V FISTULAGRAM;  Surgeon: Algernon Huxley, MD;  Location: [place] [facility];  Service: Cardiovascular;  Laterality: Left;   BASCILIC VEIN TRANSPOSITION Left 08/07/2016   Procedure: LEFT BASILIC VEIN TRANSPOSITION;  Surgeon: Angelia Mould, MD;  Location: [place];  Service: Vascular;  Laterality: Left;   CLOSED REDUCTION NASAL FRACTURE N/A 08/11/2019   Procedure: CLOSED REDUCTION NASAL FRACTURE WITH STABILIZATION;  Surgeon: Irene Limbo, MD;  Location: [place];  Service: Plastics;  Laterality: N/A;   COLONOSCOPY N/A 08/12/2016   Procedure: COLONOSCOPY;  Surgeon: Otis Brace, MD;  Location: [place];  Service: Gastroenterology;  Laterality: N/A;   COLONOSCOPY WITH PROPOFOL N/A 06/05/2021   Procedure: COLONOSCOPY WITH PROPOFOL;  Surgeon: Sharyn Creamer, MD;  Location: [place];  Service: Gastroenterology;  Laterality: N/A;   DIALYSIS/PERMA CATHETER INSERTION N/A 07/26/2021   Procedure: DIALYSIS/PERMA CATHETER INSERTION;  Surgeon: Algernon Huxley, MD;  Location: [place] [facility];  Service: Cardiovascular;  Laterality: N/A;   ESOPHAGOGASTRODUODENOSCOPY N/A 08/12/2016   Procedure: ESOPHAGOGASTRODUODENOSCOPY (EGD);  Surgeon: Otis Brace, MD;  Location: [place];  Service: Gastroenterology;  Laterality: N/A;   ESOPHAGOGASTRODUODENOSCOPY (EGD) WITH PROPOFOL N/A 06/05/2021   Procedure: ESOPHAGOGASTRODUODENOSCOPY (EGD) WITH PROPOFOL;  Surgeon: Sharyn Creamer, MD;  Location: [place];  Service: Gastroenterology;  Laterality: N/A;   INSERTION OF  DIALYSIS CATHETER N/A 08/07/2016   Procedure: INSERTION OF TUNNELED DIALYSIS CATHETER;  Surgeon: Angelia Mould, MD;  Location: [place];  Service: Vascular;  Laterality: N/A;   LIGATION OF ARTERIOVENOUS  FISTULA Left  09/12/2016   Procedure: BANDING OF LEFT  ARTERIOVENOUS  FISTULA;  Surgeon: Angelia Mould, MD;  Location: [place];  Service: Vascular;  Laterality: Left;   LIGATION OF ARTERIOVENOUS  FISTULA Left 07/17/2021   Procedure: LIGATION OF ARTERIOVENOUS  FISTULA;  Surgeon: Katha Cabal, MD;  Location: [facility];  Service: Vascular;  Laterality: Left;  also include placement of temporary dialysis catheter right or left groin   LOWER EXTREMITY INTERVENTION Right 12/02/2018   Procedure: LOWER EXTREMITY INTERVENTION;  Surgeon: Algernon Huxley, MD;  Location: [place] [facility];  Service: Cardiovascular;  Laterality: Right;   POLYPECTOMY  06/05/2021   Procedure: POLYPECTOMY;  Surgeon: Sharyn Creamer, MD;  Location: [facility];  Service: Gastroenterology;;   TEMPORARY DIALYSIS CATHETER Left 07/17/2021   Procedure: TEMPORARY DIALYSIS CATHETER;  Surgeon: Katha Cabal, MD;  Location: [facility];  Service: Vascular;  Laterality: Left;   Family History  Problem Relation Age of Onset   Diabetes Mother    Kidney failure Mother    Healthy Father    Kidney failure Brother    Healthy Sister    Kidney disease Daughter    Post-traumatic stress disorder Neg Hx    Bladder Cancer Neg Hx    Kidney cancer Neg Hx    Social History:  reports that he has been smoking cigarettes. He has a 20.00 pack-year smoking history. He has never used smokeless tobacco. He reports that he does not currently use alcohol after a past usage of about 21.0 standard drinks per week. He reports that he does not use drugs. Allergies  Allergen Reactions   Lisinopril Swelling   Prior to Admission medications   Medication Sig Start Date End Date Taking? Authorizing Provider  albuterol (VENTOLIN HFA) 108 (90 Base) MCG/ACT inhaler Inhale 2 puffs into the lungs every 4 (four) hours as needed for wheezing or shortness of breath. 06/05/21  Yes Danford, Suann Larry, MD  calcium acetate (PHOSLO) 667 MG capsule Take 2 capsules (1,334 mg  total) by mouth 3 (three) times daily with meals. 06/05/21  Yes Danford, Suann Larry, MD  cinacalcet (SENSIPAR) 30 MG tablet Take 1 tablet (30 mg total) by mouth daily with breakfast. 06/05/21  Yes Danford, Suann Larry, MD  docusate sodium (COLACE) 100 MG capsule Take 1 capsule (100 mg total) by mouth 2 (two) times daily. 06/05/21  Yes Danford, Suann Larry, MD  epoetin alfa (EPOGEN) 4000 UNIT/ML injection Inject 1 mL (4,000 Units total) into the vein every Monday, Wednesday, and Friday with hemodialysis. 06/22/20  Yes Enzo Bi, MD  ferrous sulfate 325 (65 FE) MG tablet Take 1 tablet (325 mg total) by mouth 2 (two) times daily with a meal. 06/05/21  Yes Danford, Suann Larry, MD  metoprolol tartrate (LOPRESSOR) 50 MG tablet Take 1 tablet (50 mg total) by mouth 2 (two) times daily. 07/04/21  Yes Jennye Boroughs, MD  pantoprazole (PROTONIX) 20 MG tablet Take 1 tablet (20 mg total) by mouth 2 (two) times daily before a meal. 06/05/21 08/07/21  Danford, Suann Larry, MD   Current Facility-Administered Medications  Medication Dose Route Frequency Provider Last Rate Last Admin   albuterol (PROVENTIL) (2.5 MG/3ML) 0.083% nebulizer solution 2.5 mg  2.5 mg Inhalation Q4H PRN Tu, Ching T, DO  calcium acetate (PHOSLO) capsule 1,334 mg  1,334 mg Oral TID WC Tu, Ching T, DO       Chlorhexidine Gluconate Cloth 2 % PADS 6 each  6 each Topical Q0600 Edra Riccardi, Ria Comment, PA       cinacalcet (SENSIPAR) tablet 30 mg  30 mg Oral Q breakfast Tu, Ching T, DO       docusate sodium (COLACE) capsule 100 mg  100 mg Oral BID Tu, Ching T, DO       ferrous sulfate tablet 325 mg  325 mg Oral BID WC Tu, Ching T, DO       metoprolol tartrate (LOPRESSOR) tablet 50 mg  50 mg Oral BID Tu, Ching T, DO   50 mg at 08/11/21 0112   pantoprazole (PROTONIX) EC tablet 20 mg  20 mg Oral BID AC Tu, Ching T, DO       permethrin (ELIMITE) 5 % cream   Topical Daily Tu, Ching T, DO       sodium zirconium cyclosilicate (LOKELMA) packet 5 g   5 g Oral Once Jen Mow, [place]       Labs: Basic Metabolic Panel: Recent Labs  Lab 08/06/21 2300 08/10/21 1931 08/11/21 0305  NA 143 143 142  K 4.5 5.5* 6.1*  CL 101 101 104  CO2 27 26 24   GLUCOSE 81 91 112*  BUN 51* 46* 48*  CREATININE 9.76* 9.15* 9.38*  CALCIUM 9.2 9.9 9.4   Liver Function Tests: Recent Labs  Lab 08/06/21 2300 08/10/21 1931  AST 17 18  ALT 9 12  ALKPHOS 102 121  BILITOT 1.1 1.0  PROT 7.7 7.6  ALBUMIN 3.2* 3.3*   CBC: Recent Labs  Lab 08/06/21 2300 08/07/21 0706 08/10/21 1931  WBC 7.3 6.6 8.6  NEUTROABS 5.3 4.6 6.4  HGB 7.4* 6.7* 7.7*  HCT 23.7* 21.5* 25.4*  MCV 99.2 96.4 102.0*  PLT 195 168 161   Studies/Results: DG Abd 1 View  Result Date: 08/10/2021 CLINICAL DATA:  Abdominal pain, fever EXAM: ABDOMEN - 1 VIEW COMPARISON:  07/03/2021 FINDINGS: Supine frontal view of the abdomen and pelvis excludes the right hemidiaphragm and bilateral flanks by collimation. Centralization of gas-filled bowel within the central abdomen may reflect underlying ascites. No evidence of bowel obstruction or ileus. No masses or abnormal calcifications. No acute bony abnormalities. IMPRESSION: 1. No bowel obstruction or ileus. 2. Centralization of gas-filled loops of bowel within the central abdomen may signify underlying ascites. Electronically Signed   By: Randa Ngo M.D.   On: 08/10/2021 23:24   DG Chest Port 1 View  Result Date: 08/10/2021 CLINICAL DATA:  Questionable sepsis. EXAM: PORTABLE CHEST 1 VIEW COMPARISON:  Portable chest 08/06/2021. FINDINGS: Right IJ dialysis catheter again has its tip about the cavoatrial junction. The heart is enlarged. There is mild perihilar vascular congestion, mild perihilar interstitial edema and minimal pleural effusions. No focal lung consolidation is seen. The aorta is heavily calcified. Similar findings were noted previously but the patchy infiltrates of the mid and lower lung fields on the prior study are not seen  today. IMPRESSION: Cardiomegaly with mild perihilar vascular congestion and central edema, with airspace infiltrates on the prior study not seen today. Electronically Signed   By: Telford Nab M.D.   On: 08/10/2021 20:24    ROS: All others negative except those listed in HPI.  Physical Exam: Vitals:   08/11/21 0930 08/11/21 1000 08/11/21 1030 08/11/21 1100  BP: 130/69 (!) 167/77 (!) 121/93 (!) 157/84  Pulse:  Resp: 18 (!) 25 (!) 21 (!) 22  Temp:      TempSrc:      SpO2:      Weight:      Height:         General: chronically ill appearing male in NAD Head: NCAT +facial/periorbital edema, MMM Neck: Supple. No lymphadenopathy Lungs: breath sounds decreased, +scattered wheezing, no crackles Heart: RRR. No murmur, rubs or gallops.  Abdomen: soft, nontender, +BS Lower extremities:trace LE edema Psych:  Responds to questions appropriately with a normal affect. Dialysis Access: [facility]  Dialysis Orders:  MWF - [facility]  4hrs, BFR 450, DFR 800,  EDW 67kg, 2K/ 2Ca  Access: TDC  Heparin none Calcitriol 1.15mcg PO qHD   Assessment/Plan:  Surgical wound infection- s/p AVG removal by [facility] 10/26.  Scheduled for debridement but left prior to procedure.  Was scheduled again for 10/23 but now cancelled.  Vascular consulted.  Bacteremia - BC +serratia on previous admission.  Planned for cefepime on d/c but did not receive as outpatient due to non compliance with HD. ABX per PMD. Non compliance - Chronic issue. Discussed importance of compliance with dialysis especially when scheduled to receive antibiotics.  As well as compliance with medical care.  Continue to educate.  Hyperkalemia - due to non compliance with HD.  K 6.1.  HD today. Volume overload - chronic issue due to non compliance. HD today.   ESRD -  on HD MWF.  HD today. Due to Thanksgiving holiday patient will run on alternate schedule this week.  Inpatient and outpatient holiday schedules differ due to unforeseen circumstances  and are as follows: Normal HD Schedule: Monday Wednesday Friday Inpatient HD schedule at [facility]: Sunday, Tuesday, Friday Outpatient HD schedule: Monday, Wednesday, Saturday They will resume their normal schedule on 08/19/21.  Hypertension - BP elevated, likely due to volume overload.   Anemia of CKD - Hgb 7.7.  Will order ESA.    Secondary Hyperparathyroidism -  Ca in goal. Check phos. Continue binders and VDRA.   Nutrition - Renal diet w/fluid restrictions. COPD - on 2L O2 prn at baseline A fib  Jen Mow, PA-C [employer] 08/11/2021, 11:17 AM

## 2021-08-11 NOTE — Procedures (Signed)
   I was present at this dialysis session, have reviewed the session itself and made  appropriate changes Kelly Splinter MD [place] pager [phone]   08/11/2021, 3:17 PM

## 2021-08-11 NOTE — Progress Notes (Addendum)
Patient has left AMA paperwork is signd

## 2021-08-12 ENCOUNTER — Inpatient Hospital Stay: Admission: RE | Admit: 2021-08-12 | Payer: Medicare Other | Source: Ambulatory Visit

## 2021-08-13 ENCOUNTER — Encounter: Payer: Medicare Other | Admitting: Nurse Practitioner

## 2021-08-14 ENCOUNTER — Encounter: Admission: RE | Payer: Self-pay | Source: Home / Self Care

## 2021-08-14 ENCOUNTER — Ambulatory Visit: Admission: RE | Admit: 2021-08-14 | Payer: Medicare Other | Source: Home / Self Care | Admitting: Vascular Surgery

## 2021-08-14 SURGERY — DEBRIDEMENT, WOUND
Anesthesia: General | Laterality: Left

## 2021-08-15 LAB — CULTURE, BLOOD (ROUTINE X 2)
Culture: NO GROWTH
Culture: NO GROWTH
Special Requests: ADEQUATE
Special Requests: ADEQUATE

## 2021-08-20 ENCOUNTER — Observation Stay
Admission: EM | Admit: 2021-08-20 | Discharge: 2021-08-22 | Disposition: A | Discharge status: Home / Self Care | Payer: Medicare Other | Attending: Internal Medicine | Admitting: Internal Medicine

## 2021-08-20 ENCOUNTER — Emergency Department: Payer: Medicare Other

## 2021-08-20 ENCOUNTER — Other Ambulatory Visit: Payer: Self-pay

## 2021-08-20 DIAGNOSIS — Z79899 Other long term (current) drug therapy: Secondary | ICD-10-CM | POA: Insufficient documentation

## 2021-08-20 DIAGNOSIS — I5041 Acute combined systolic (congestive) and diastolic (congestive) heart failure: Secondary | ICD-10-CM | POA: Insufficient documentation

## 2021-08-20 DIAGNOSIS — J449 Chronic obstructive pulmonary disease, unspecified: Secondary | ICD-10-CM | POA: Insufficient documentation

## 2021-08-20 DIAGNOSIS — I160 Hypertensive urgency: Secondary | ICD-10-CM | POA: Insufficient documentation

## 2021-08-20 DIAGNOSIS — I480 Paroxysmal atrial fibrillation: Secondary | ICD-10-CM | POA: Insufficient documentation

## 2021-08-20 DIAGNOSIS — Z20822 Contact with and (suspected) exposure to covid-19: Secondary | ICD-10-CM | POA: Insufficient documentation

## 2021-08-20 DIAGNOSIS — I161 Hypertensive emergency: Secondary | ICD-10-CM

## 2021-08-20 DIAGNOSIS — F17210 Nicotine dependence, cigarettes, uncomplicated: Secondary | ICD-10-CM | POA: Diagnosis not present

## 2021-08-20 DIAGNOSIS — D631 Anemia in chronic kidney disease: Secondary | ICD-10-CM | POA: Diagnosis not present

## 2021-08-20 DIAGNOSIS — N186 End stage renal disease: Secondary | ICD-10-CM | POA: Diagnosis present

## 2021-08-20 DIAGNOSIS — E875 Hyperkalemia: Secondary | ICD-10-CM | POA: Diagnosis not present

## 2021-08-20 DIAGNOSIS — Z992 Dependence on renal dialysis: Secondary | ICD-10-CM | POA: Insufficient documentation

## 2021-08-20 DIAGNOSIS — Z8616 Personal history of COVID-19: Secondary | ICD-10-CM | POA: Insufficient documentation

## 2021-08-20 DIAGNOSIS — R109 Unspecified abdominal pain: Secondary | ICD-10-CM | POA: Diagnosis present

## 2021-08-20 DIAGNOSIS — R7989 Other specified abnormal findings of blood chemistry: Secondary | ICD-10-CM | POA: Diagnosis present

## 2021-08-20 DIAGNOSIS — R188 Other ascites: Secondary | ICD-10-CM

## 2021-08-20 DIAGNOSIS — T827XXD Infection and inflammatory reaction due to other cardiac and vascular devices, implants and grafts, subsequent encounter: Secondary | ICD-10-CM

## 2021-08-20 DIAGNOSIS — E8770 Fluid overload, unspecified: Secondary | ICD-10-CM | POA: Diagnosis present

## 2021-08-20 DIAGNOSIS — Z91158 Patient's noncompliance with renal dialysis for other reason: Secondary | ICD-10-CM

## 2021-08-20 DIAGNOSIS — S41101S Unspecified open wound of right upper arm, sequela: Secondary | ICD-10-CM

## 2021-08-20 DIAGNOSIS — I5043 Acute on chronic combined systolic (congestive) and diastolic (congestive) heart failure: Secondary | ICD-10-CM | POA: Diagnosis not present

## 2021-08-20 DIAGNOSIS — Z9115 Patient's noncompliance with renal dialysis: Secondary | ICD-10-CM

## 2021-08-20 DIAGNOSIS — R778 Other specified abnormalities of plasma proteins: Secondary | ICD-10-CM | POA: Diagnosis present

## 2021-08-20 DIAGNOSIS — S41102S Unspecified open wound of left upper arm, sequela: Secondary | ICD-10-CM

## 2021-08-20 LAB — CBC WITH DIFFERENTIAL/PLATELET
Abs Immature Granulocytes: 0.11 10*3/uL — ABNORMAL HIGH (ref 0.00–0.07)
Basophils Absolute: 0.1 10*3/uL (ref 0.0–0.1)
Basophils Relative: 1 %
Eosinophils Absolute: 0.1 10*3/uL (ref 0.0–0.5)
Eosinophils Relative: 1 %
HCT: 25.1 % — ABNORMAL LOW (ref 39.0–52.0)
Hemoglobin: 7.6 g/dL — ABNORMAL LOW (ref 13.0–17.0)
Immature Granulocytes: 1 %
Lymphocytes Relative: 8 %
Lymphs Abs: 0.9 10*3/uL (ref 0.7–4.0)
MCH: 31.1 pg (ref 26.0–34.0)
MCHC: 30.3 g/dL (ref 30.0–36.0)
MCV: 102.9 fL — ABNORMAL HIGH (ref 80.0–100.0)
Monocytes Absolute: 0.7 10*3/uL (ref 0.1–1.0)
Monocytes Relative: 6 %
Neutro Abs: 8.9 10*3/uL — ABNORMAL HIGH (ref 1.7–7.7)
Neutrophils Relative %: 83 %
Platelets: 152 10*3/uL (ref 150–400)
RBC: 2.44 MIL/uL — ABNORMAL LOW (ref 4.22–5.81)
RDW: 18.2 % — ABNORMAL HIGH (ref 11.5–15.5)
WBC: 10.7 10*3/uL — ABNORMAL HIGH (ref 4.0–10.5)
nRBC: 0.2 % (ref 0.0–0.2)

## 2021-08-20 LAB — COMPREHENSIVE METABOLIC PANEL
ALT: 12 U/L (ref 0–44)
AST: 16 U/L (ref 15–41)
Albumin: 3.6 g/dL (ref 3.5–5.0)
Alkaline Phosphatase: 106 U/L (ref 38–126)
Anion gap: 19 — ABNORMAL HIGH (ref 5–15)
BUN: 104 mg/dL — ABNORMAL HIGH (ref 6–20)
CO2: 20 mmol/L — ABNORMAL LOW (ref 22–32)
Calcium: 9.6 mg/dL (ref 8.9–10.3)
Chloride: 103 mmol/L (ref 98–111)
Creatinine, Ser: 12.97 mg/dL — ABNORMAL HIGH (ref 0.61–1.24)
GFR, Estimated: 4 mL/min — ABNORMAL LOW (ref >60)
Glucose, Bld: 74 mg/dL (ref 70–99)
Potassium: 6.2 mmol/L — ABNORMAL HIGH (ref 3.5–5.1)
Sodium: 142 mmol/L (ref 135–145)
Total Bilirubin: 1.3 mg/dL — ABNORMAL HIGH (ref 0.3–1.2)
Total Protein: 8.2 g/dL — ABNORMAL HIGH (ref 6.5–8.1)

## 2021-08-20 LAB — TROPONIN I (HIGH SENSITIVITY): Troponin I (High Sensitivity): 79 ng/L — ABNORMAL HIGH (ref <18)

## 2021-08-20 LAB — PHOSPHORUS: Phosphorus: 8.2 mg/dL — ABNORMAL HIGH (ref 2.5–4.6)

## 2021-08-20 LAB — LACTIC ACID, PLASMA: Lactic Acid, Venous: 1.4 mmol/L (ref 0.5–1.9)

## 2021-08-20 LAB — MAGNESIUM: Magnesium: 2.8 mg/dL — ABNORMAL HIGH (ref 1.7–2.4)

## 2021-08-20 IMAGING — CT CT ABD-PELV W/O CM
2 of 4 series · 16 of 46 positions shown, 18 images · non-contrast
Comparison: [DATE] and [DATE]

CLINICAL DATA: Abdominal pain and distention. End-stage renal
disease on dialysis.

EXAM:
CT ABDOMEN AND PELVIS WITHOUT CONTRAST
TECHNIQUE: Multidetector CT imaging of the abdomen and pelvis was performed
following the standard protocol without IV contrast.

[Series 2: routine abd/pel wo · axial · 0.76mm/px · z∈[-1143,-693]mm · 13 of 101 slices shown, 15 images]
[im 6/101  soft-tissue]
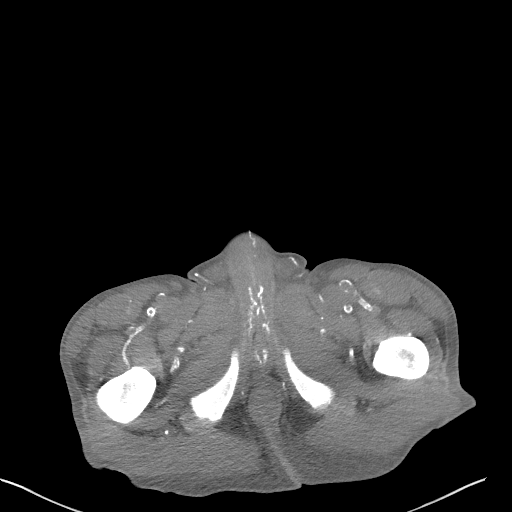
[im 6/101  bone]
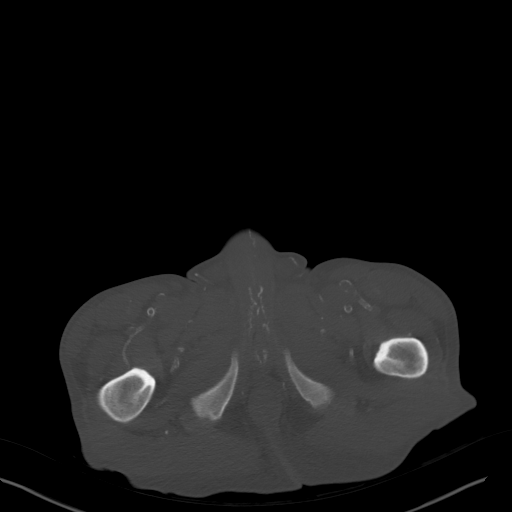
[im 16/101  soft-tissue]
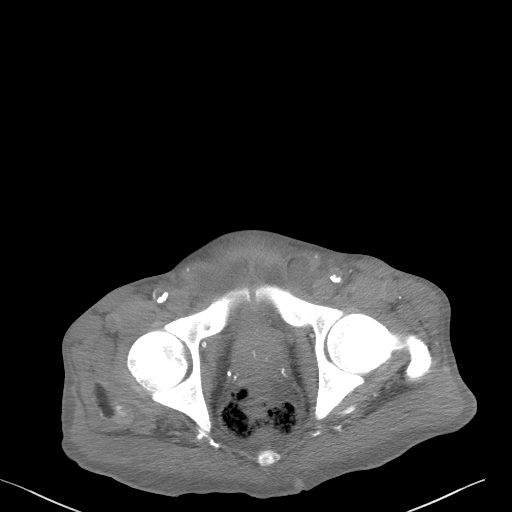
[im 21/101  soft-tissue]
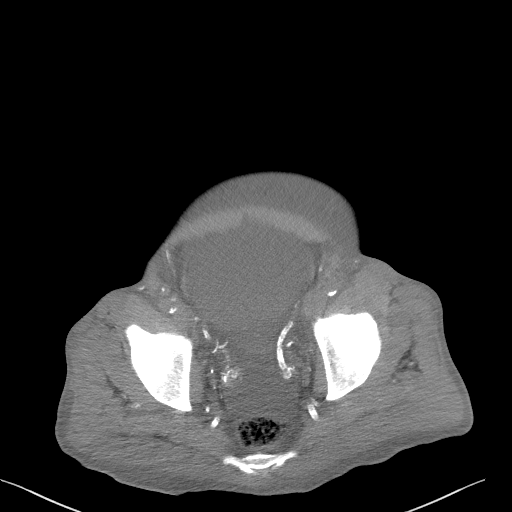
[im 31/101  soft-tissue]
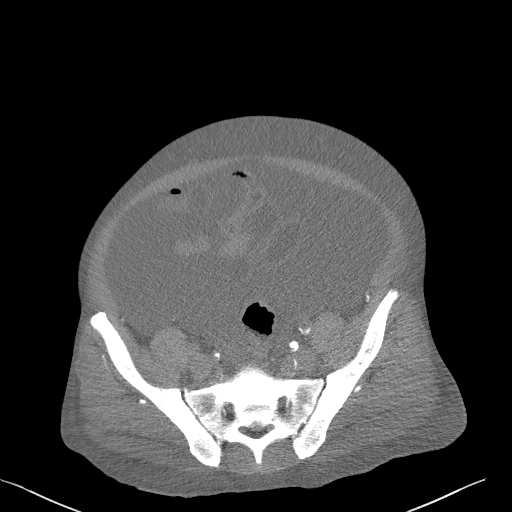
[im 36/101  soft-tissue]
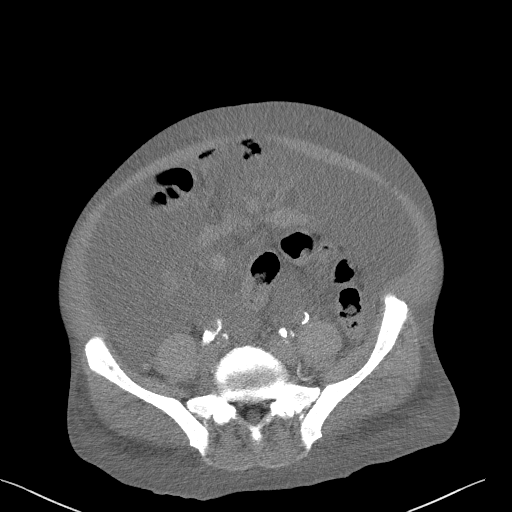
[im 46/101  soft-tissue]
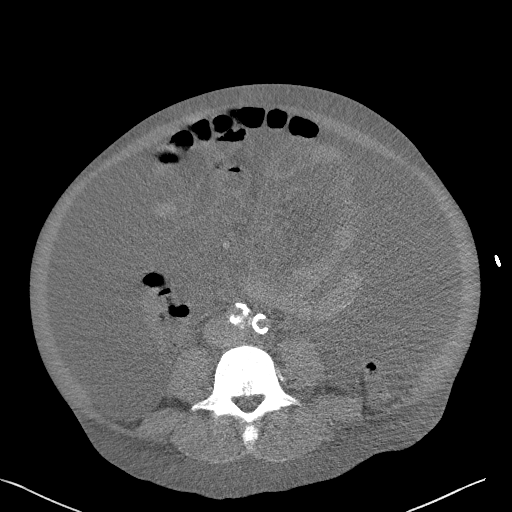
[im 51/101  soft-tissue]
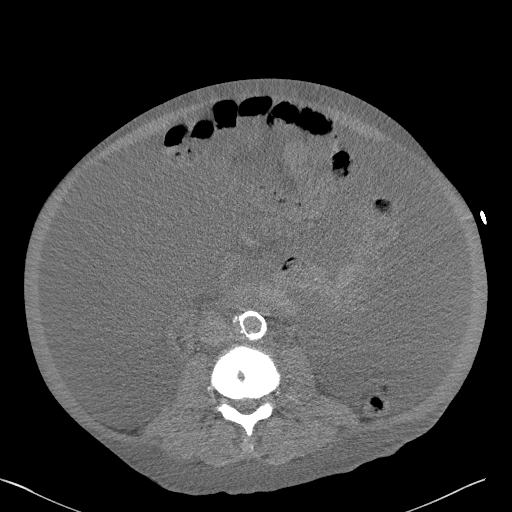
[im 56/101  soft-tissue]
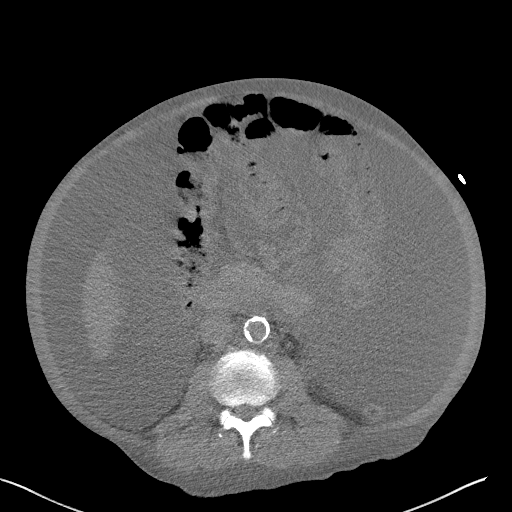
[im 66/101  soft-tissue]
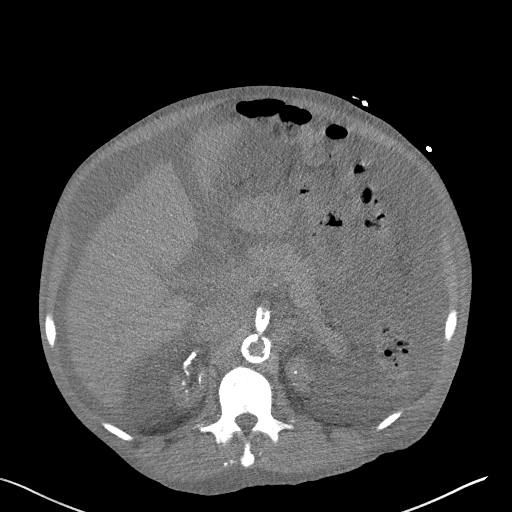
[im 66/101  bone]
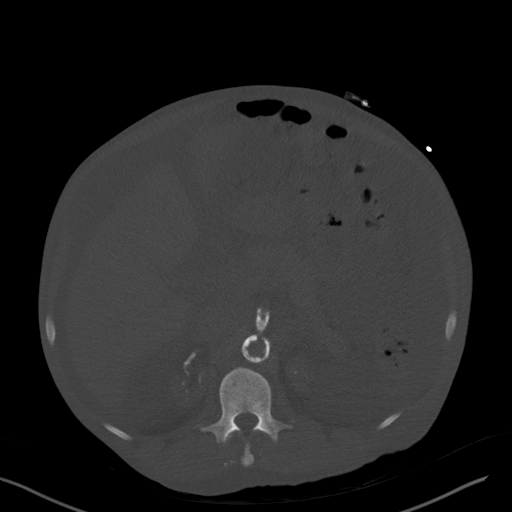
[im 71/101  soft-tissue]
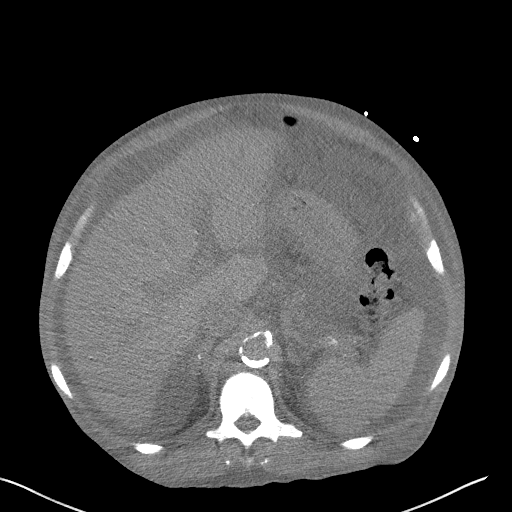
[im 81/101  soft-tissue]
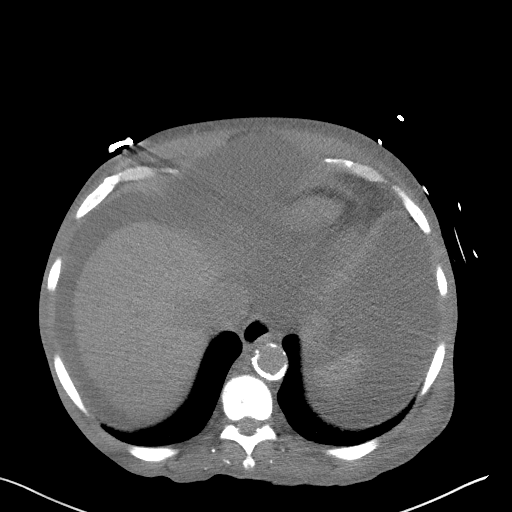
[im 86/101  soft-tissue]
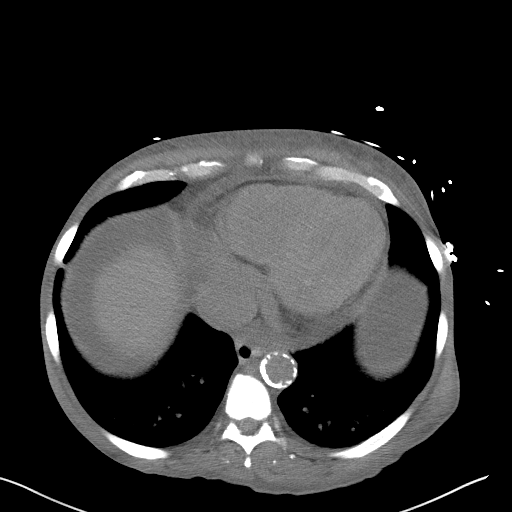
[im 96/101  soft-tissue]
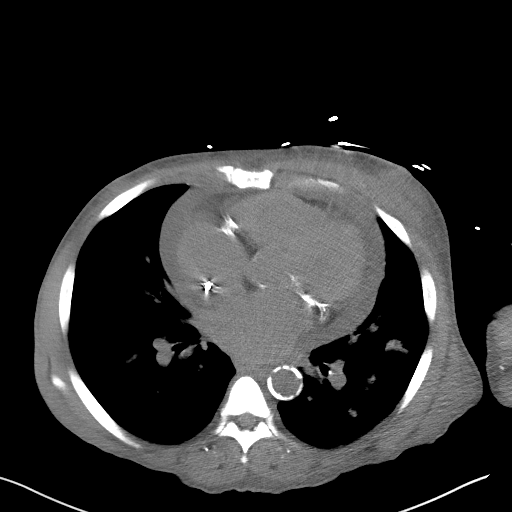

[Series 5: coronal st · coronal · 0.83mm/px · 3 of 107 slices shown]
[im 36/107  soft-tissue]
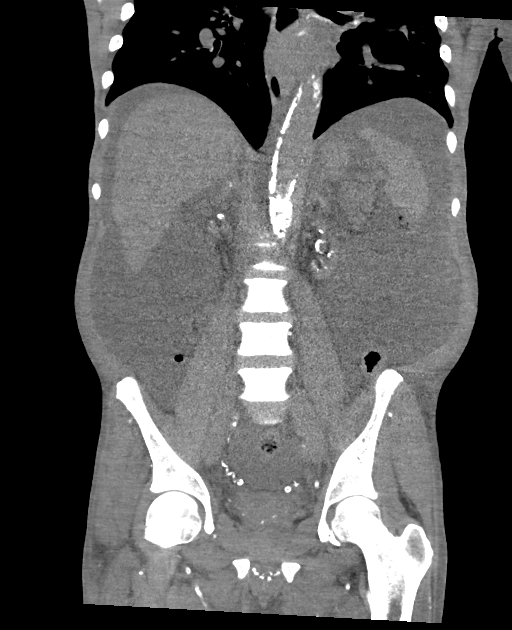
[im 48/107  soft-tissue]
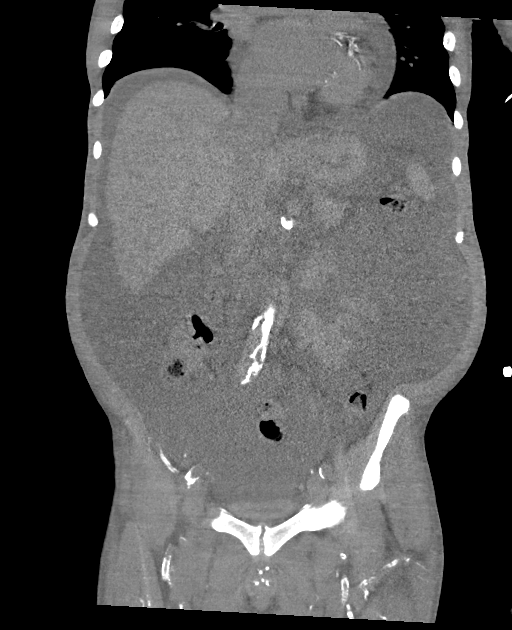
[im 59/107  soft-tissue]
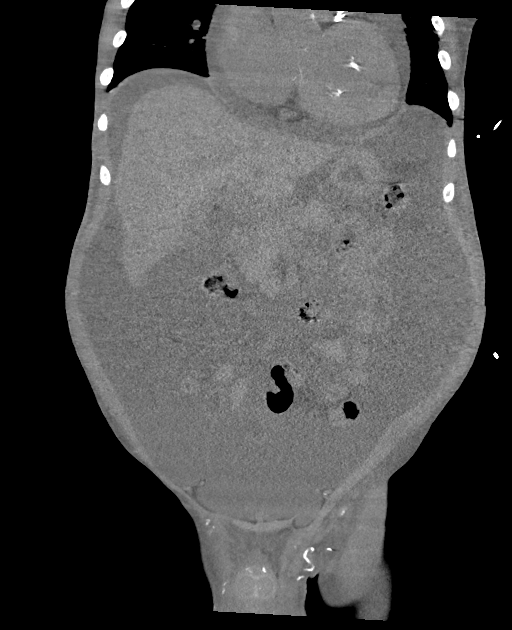

[16 of 46 positions shown; findings below may reference images not displayed]

FINDINGS: Lower chest: Multiple ill-defined nodular opacities are seen in the
superior left lower lobe and lingula, which are new since previous
studies several months ago, and most likely inflammatory or
infectious in etiology. A moderate pericardial effusion shows no
significant change.

Hepatobiliary: No mass visualized on this unenhanced exam.
Gallbladder is unremarkable no evidence of biliary ductal
dilatation.

Pancreas: No mass or inflammatory process visualized on this
unenhanced exam.

Spleen:  Within normal limits in size.

Adrenals/Urinary tract: Severe diffuse bilateral renal parenchymal
atrophy is consistent with chronic end-stage renal disease. No
evidence of hydronephrosis. Urinary bladder is nearly completely
empty.

Stomach/Bowel: Unremarkable.

Vascular/Lymphatic: No pathologically enlarged lymph nodes
identified. No evidence of abdominal aortic aneurysm. Aortic
atherosclerotic calcification noted. Mild dilatation of the right
common iliac artery is again seen measuring 1.9 cm. Old calcified
dissection is again seen within the distal abdominal aorta and right
common iliac artery.

Reproductive:  Stable mild to moderately enlarged prostate.

Other: Large amount of ascites and diffuse mesenteric and body wall
edema show no significant change.

Musculoskeletal: No suspicious bone lesions identified. Diffuse
osteosclerosis, consistent with renal osteodystrophy.
IMPRESSION: No significant change in large amount of ascites, and diffuse
mesenteric and body wall edema.

Stable moderate pericardial effusion.

New ill-defined nodular opacities in superior left lower lobe and
lingula, most likely inflammatory or infectious in etiology.
Continued follow-up by chest CT is recommended in 2-3 months.

Findings of end-stage renal disease and renal osteodystrophy.

## 2021-08-20 MED ORDER — CALCIUM GLUCONATE-NACL 1-0.675 GM/50ML-% IV SOLN
INTRAVENOUS | Status: AC
  Administered 2021-08-20: 1000 mg via INTRAVENOUS
  Filled 2021-08-20: qty 50

## 2021-08-20 MED ORDER — ACETAMINOPHEN 325 MG PO TABS: 650.0000 mg | ORAL_TABLET | ORAL | Status: DC | PRN

## 2021-08-20 MED ORDER — PANTOPRAZOLE SODIUM 20 MG PO TBEC
20.0000 mg | DELAYED_RELEASE_TABLET | Freq: Two times a day (BID) | ORAL | Status: DC
  Administered 2021-08-22 (×2): 20 mg via ORAL
  Filled 2021-08-20 (×6): qty 1

## 2021-08-20 MED ORDER — INSULIN ASPART 100 UNIT/ML IJ SOLN
INTRAMUSCULAR | Status: AC
  Administered 2021-08-20: 5 [IU] via INTRAVENOUS
  Filled 2021-08-20: qty 1

## 2021-08-20 MED ORDER — PATIROMER SORBITEX CALCIUM 8.4 G PO PACK
16.8000 g | PACK | Freq: Every day | ORAL | Status: DC
  Administered 2021-08-20 – 2021-08-22 (×2): 16.8 g via ORAL
  Filled 2021-08-20 (×3): qty 2

## 2021-08-20 MED ORDER — CALCIUM ACETATE (PHOS BINDER) 667 MG PO CAPS
1334.0000 mg | ORAL_CAPSULE | Freq: Three times a day (TID) | ORAL | Status: DC
  Administered 2021-08-22 (×2): 1334 mg via ORAL
  Filled 2021-08-20 (×8): qty 2

## 2021-08-20 MED ORDER — DEXTROSE 50 % IV SOLN: 25.0000 g | Freq: Once | INTRAVENOUS | Status: AC

## 2021-08-20 MED ORDER — FUROSEMIDE 10 MG/ML IJ SOLN: 80.0000 mg | Freq: Once | INTRAMUSCULAR | Status: AC

## 2021-08-20 MED ORDER — METOPROLOL TARTRATE 50 MG PO TABS
50.0000 mg | ORAL_TABLET | Freq: Two times a day (BID) | ORAL | Status: DC
  Administered 2021-08-21 – 2021-08-22 (×2): 50 mg via ORAL
  Filled 2021-08-20 (×3): qty 1

## 2021-08-20 MED ORDER — SODIUM CHLORIDE 0.9% FLUSH
3.0000 mL | Freq: Two times a day (BID) | INTRAVENOUS | Status: DC
  Administered 2021-08-20 – 2021-08-22 (×2): 3 mL via INTRAVENOUS

## 2021-08-20 MED ORDER — CINACALCET HCL 30 MG PO TABS
30.0000 mg | ORAL_TABLET | Freq: Every day | ORAL | Status: DC
  Administered 2021-08-22: 30 mg via ORAL
  Filled 2021-08-20 (×3): qty 1

## 2021-08-20 MED ORDER — INSULIN ASPART 100 UNIT/ML IV SOLN
5.0000 [IU] | Freq: Once | INTRAVENOUS | Status: AC
  Filled 2021-08-20: qty 0.05

## 2021-08-20 MED ORDER — FUROSEMIDE 10 MG/ML IJ SOLN
40.0000 mg | Freq: Two times a day (BID) | INTRAMUSCULAR | Status: DC
  Administered 2021-08-21 – 2021-08-22 (×3): 40 mg via INTRAVENOUS
  Filled 2021-08-20 (×3): qty 4

## 2021-08-20 MED ORDER — ONDANSETRON HCL 4 MG/2ML IJ SOLN
4.0000 mg | Freq: Four times a day (QID) | INTRAMUSCULAR | Status: DC | PRN
  Administered 2021-08-21: 4 mg via INTRAVENOUS
  Filled 2021-08-20: qty 2

## 2021-08-20 MED ORDER — SODIUM CHLORIDE 0.9 % IV SOLN: 250.0000 mL | INTRAVENOUS | Status: DC | PRN

## 2021-08-20 MED ORDER — FUROSEMIDE 10 MG/ML IJ SOLN
INTRAMUSCULAR | Status: AC
  Administered 2021-08-20: 80 mg via INTRAVENOUS
  Filled 2021-08-20: qty 8

## 2021-08-20 MED ORDER — CALCIUM GLUCONATE-NACL 1-0.675 GM/50ML-% IV SOLN: 1.0000 g | Freq: Once | INTRAVENOUS | Status: AC

## 2021-08-20 MED ORDER — DEXTROSE 50 % IV SOLN
INTRAVENOUS | Status: AC
  Administered 2021-08-20: 25 g via INTRAVENOUS
  Filled 2021-08-20: qty 50

## 2021-08-20 MED ORDER — SODIUM CHLORIDE 0.9% FLUSH: 3.0000 mL | INTRAVENOUS | Status: DC | PRN

## 2021-08-20 NOTE — ED Notes (Signed)
Pt cleaned at this time and placed on male purewick. Pt able to adjust self in bed.

## 2021-08-20 NOTE — ED Notes (Signed)
IV team at bedside 

## 2021-08-20 NOTE — ED Provider Notes (Signed)
[facility]  ____________________________________________   Event Date/Time   First MD Initiated Contact with Patient 08/20/21 1917     (approximate)  I have reviewed the triage vital signs and the nursing notes.   HISTORY  Chief Complaint Abdominal Pain and Vascular Access Problem    HPI Jordan [name] is a 55 y.o. 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  Patient is not exactly sure when his last dialysis was.  Thinks it may have been on Wednesday but cannot say for sure.  Has had difficulty with transport.  Patient endorses diffuse abdominal pain shortness of breath and generally just feeling unwell.  He also complains of bright red blood per rectum and as well as blood in his urine.  Denies fevers or chills.  Denies chest pain.         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 08/07/2021   Wound infection after surgery 08/07/2021   CAP (community acquired pneumonia) 07/30/2021   ABLA (acute blood loss anemia) 07/17/2021   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 ([place]) 07/17/2021   AV fistula infection, 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/17/2021   AV fistula thrombosis, 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/17/2021   Pressure injury of skin 07/15/2021   Acute on chronic respiratory failure with hypoxemia ([place])  07/12/2021   Ascites    Protein-calorie malnutrition, severe 06/05/2021   Hypervolemia associated with renal insufficiency 06/02/2021   Symptomatic anemia 05/28/2021   Acute on chronic diastolic CHF (congestive heart failure) ([place]) 05/28/2021   Hyperkalemia 05/28/2021   Pulmonary edema 04/29/2021   Pruritus 04/29/2021   COVID-19 virus infection 04/29/2021   Thrombocytopenia ([place]) 04/08/2021   COPD (chronic obstructive pulmonary disease) ([place]) 04/08/2021   CHF (congestive heart failure) ([place]) 03/25/2021   PAF (paroxysmal atrial fibrillation) ([place]) 03/20/2021   Chronic diastolic CHF (congestive heart failure) ([place]) 03/20/2021   Alcohol abuse 03/20/2021   Vision loss of left eye 03/20/2021   SOB (shortness of breath) 03/20/2021   Shortness of breath 03/10/2021   Abscess of buttock 03/02/2021   Fluid overload 03/02/2021   Multifocal pneumonia 01/13/2021   Abdominal pain 06/19/2020   Current mild episode of major depressive disorder without prior episode ([place]) 04/09/2020   Atrial fibrillation with RVR ([place]) 04/04/2020   Non-compliance with renal dialysis ([place]) 03/21/2020   Acute CHF (congestive heart failure) ([place]) 03/12/2020   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 ([place]) 02/03/2020   Acute on chronic combined systolic and diastolic CHF (congestive heart failure) ([place]) 02/03/2020   Acute on chronic respiratory failure with hypoxia ([place]) 11/12/2019   Aortic atherosclerosis ([place]) 11/12/2019   Emphysema of lung ([place]) 11/12/2019   Volume overload 10/05/2019   Elevated troponin 05/17/2018   Anemia in ESRD (end-stage renal disease) ([place]) 05/17/2018   ESRD on dialysis ([place])  05/26/2017   Essential hypertension 05/26/2017   Moderate protein-calorie malnutrition ([place]) 08/13/2016   Secondary hyperparathyroidism of renal origin ([place]) 08/13/2016   Tobacco abuse 08/04/2016   Hypertensive emergency     Past Surgical History:  Procedure Laterality Date   A/V FISTULAGRAM Left  07/15/2021   Procedure: A/V FISTULAGRAM;  Surgeon: Algernon Huxley, MD;  Location: [facility];  Service: Cardiovascular;  Laterality: Left;   BASCILIC VEIN TRANSPOSITION Left 08/07/2016   Procedure: LEFT BASILIC VEIN TRANSPOSITION;  Surgeon: Angelia Mould, MD;  Location: [facility];  Service: Vascular;  Laterality: Left;   CLOSED REDUCTION NASAL FRACTURE N/A 08/11/2019   Procedure: CLOSED REDUCTION NASAL FRACTURE WITH STABILIZATION;  Surgeon: Irene Limbo, MD;  Location: [facility];  Service: Plastics;  Laterality: N/A;   COLONOSCOPY N/A 08/12/2016   Procedure: COLONOSCOPY;  Surgeon: Otis Brace, MD;  Location: [facility];  Service: Gastroenterology;  Laterality: N/A;   COLONOSCOPY WITH PROPOFOL N/A 06/05/2021   Procedure: COLONOSCOPY WITH PROPOFOL;  Surgeon: Sharyn Creamer, MD;  Location: [facility];  Service: Gastroenterology;  Laterality: N/A;   DIALYSIS/PERMA CATHETER INSERTION N/A 07/26/2021   Procedure: DIALYSIS/PERMA CATHETER INSERTION;  Surgeon: Algernon Huxley, MD;  Location: [facility];  Service: Cardiovascular;  Laterality: N/A;   ESOPHAGOGASTRODUODENOSCOPY N/A 08/12/2016   Procedure: ESOPHAGOGASTRODUODENOSCOPY (EGD);  Surgeon: Otis Brace, MD;  Location: [facility];  Service: Gastroenterology;  Laterality: N/A;   ESOPHAGOGASTRODUODENOSCOPY (EGD) WITH PROPOFOL N/A 06/05/2021   Procedure: ESOPHAGOGASTRODUODENOSCOPY (EGD) WITH PROPOFOL;  Surgeon: Sharyn Creamer, MD;  Location: [facility];  Service: Gastroenterology;  Laterality: N/A;   INSERTION OF DIALYSIS CATHETER N/A 08/07/2016   Procedure: INSERTION OF TUNNELED DIALYSIS CATHETER;  Surgeon: Angelia Mould, MD;  Location: [facility];  Service: Vascular;  Laterality: N/A;   LIGATION OF ARTERIOVENOUS  FISTULA Left 09/12/2016   Procedure: BANDING OF LEFT  ARTERIOVENOUS  FISTULA;  Surgeon: Angelia Mould, MD;  Location: [facility];  Service: Vascular;  Laterality: Left;   LIGATION OF ARTERIOVENOUS   FISTULA Left 07/17/2021   Procedure: LIGATION OF ARTERIOVENOUS  FISTULA;  Surgeon: Katha Cabal, MD;  Location: [facility];  Service: Vascular;  Laterality: Left;  also include placement of temporary dialysis catheter right or left groin   LOWER EXTREMITY INTERVENTION Right 12/02/2018   Procedure: LOWER EXTREMITY INTERVENTION;  Surgeon: Algernon Huxley, MD;  Location: [facility];  Service: Cardiovascular;  Laterality: Right;   POLYPECTOMY  06/05/2021   Procedure: POLYPECTOMY;  Surgeon: Sharyn Creamer, MD;  [facility];  Service: Gastroenterology;;   TEMPORARY DIALYSIS CATHETER Left 07/17/2021   Procedure: TEMPORARY DIALYSIS CATHETER;  Surgeon: Katha Cabal, MD;  Location: [facility];  Service: Vascular;  Laterality: Left;    Prior to Admission medications   Medication Sig Start Date End Date Taking? Authorizing Provider  albuterol (VENTOLIN HFA) 108 (90 Base) MCG/ACT inhaler Inhale 2 puffs into the lungs every 4 (four) hours as needed for wheezing or shortness of breath. 06/05/21   Danford, Suann Larry, MD  calcium acetate (PHOSLO) 667 MG capsule Take 2 capsules (1,334 mg total) by mouth 3 (three) times daily with meals. 06/05/21   Danford, Suann Larry, MD  cinacalcet (SENSIPAR) 30 MG tablet Take 1 tablet (30 mg total) by mouth daily with breakfast. 06/05/21   Danford, Suann Larry, MD  docusate sodium (COLACE) 100 MG capsule Take 1 capsule (100 mg total) by mouth 2 (two) times daily. 06/05/21   Danford, Suann Larry, MD  epoetin alfa (EPOGEN) 4000 UNIT/ML injection Inject 1 mL (4,000 Units total) into the vein every Monday, Wednesday, and Friday with hemodialysis. 06/22/20   Enzo Bi, MD  ferrous sulfate 325 (65 FE) MG tablet Take 1 tablet (325 mg total) by mouth 2 (two) times daily with a meal. 06/05/21   Danford, Suann Larry, MD  metoprolol tartrate (LOPRESSOR) 50 MG tablet Take 1 tablet (50 mg total) by mouth 2 (two) times daily. 07/04/21   Jennye Boroughs, MD   pantoprazole (PROTONIX) 20 MG tablet Take 1 tablet (20 mg total) by mouth 2 (two) times daily before a meal. 06/05/21 08/07/21  Danford, Suann Larry, MD    Allergies Lisinopril  Family History  Problem Relation Age of Onset   Diabetes Mother    Kidney failure Mother    Healthy Father    Kidney failure Brother    Healthy Sister    Kidney disease Daughter    Post-traumatic stress disorder Neg Hx    Bladder Cancer Neg Hx    Kidney cancer Neg Hx     Social History Social History   Tobacco Use   Smoking status: Every Day    Packs/day: 0.50    Years: 40.00    Pack years: 20.00    Types: Cigarettes   Smokeless tobacco: Never  Vaping Use   Vaping Use: Never used  Substance Use Topics   Alcohol use: Not Currently    Alcohol/week: 21.0 standard drinks    Types: 21 Cans of beer per week    Comment: last drink 6 months ago   Drug use: No    Review of Systems   Review of Systems  Constitutional:  Negative for chills and fever.  Respiratory:  Positive for shortness of breath. Negative for chest tightness.   Cardiovascular:  Negative for chest pain.  Gastrointestinal:  Positive for abdominal distention, abdominal pain, blood in stool, constipation, nausea and vomiting.  Genitourinary:  Positive for hematuria. Negative for dysuria.  All other systems reviewed and are negative.  Physical Exam Updated Vital Signs BP (!) 174/102   Pulse (!) 109   Temp 97.7 F (36.5 C) (Oral)   Resp 15   SpO2 96%   Physical Exam Vitals and nursing note reviewed.  Constitutional:      General: He is not in acute distress.    Appearance: Normal appearance.     Comments: Chronically ill-appearing  HENT:     Head: Normocephalic and atraumatic.  Eyes:     General: No scleral icterus.    Conjunctiva/sclera: Conjunctivae normal.  Cardiovascular:     Rate and Rhythm: Normal rate and regular rhythm.  Pulmonary:     Effort: Pulmonary effort is normal. No respiratory distress.     Breath  sounds: Normal breath sounds. No wheezing.  Abdominal:     General: Abdomen is protuberant. There is distension.     Palpations: Abdomen is soft.     Tenderness: There is abdominal tenderness.     Comments: Tenderness to palpation diffusely but no guarding  Genitourinary:    Comments: Brown stool on rectal exam Musculoskeletal:        General: No deformity or signs of injury.     Cervical back: Normal range of motion.  Skin:    Coloration: Skin is not jaundiced or pale.  Neurological:     General: No focal deficit present.     Mental Status: He is alert and oriented to person, place, and time. Mental status is at baseline.  Psychiatric:        Mood and Affect: Mood normal.        Behavior: Behavior normal.     LABS (all labs ordered are listed, but only abnormal results are displayed)  Labs Reviewed  COMPREHENSIVE METABOLIC PANEL - Abnormal; Notable for the following components:      Result Value   Potassium 6.2 (*)    CO2 20 (*)    BUN 104 (*)    Creatinine, Ser 12.97 (*)    Total Protein 8.2 (*)    Total Bilirubin 1.3 (*)    GFR, Estimated 4 (*)    Anion gap 19 (*)    All other components within normal limits  CBC WITH DIFFERENTIAL/PLATELET - Abnormal; Notable for the following components:   WBC 10.7 (*)    RBC 2.44 (*)    Hemoglobin 7.6 (*)    HCT 25.1 (*)    MCV 102.9 (*)    RDW 18.2 (*)    Neutro Abs 8.9 (*)    Abs Immature Granulocytes 0.11 (*)    All other components within normal limits  LACTIC ACID, PLASMA - Abnormal; Notable for the following components:   Lactic Acid, Venous 2.1 (*)    All other components within normal limits  MAGNESIUM - Abnormal; Notable for the following components:   Magnesium 2.8 (*)    All other components within normal limits  PHOSPHORUS - Abnormal; Notable for the following components:   Phosphorus 8.2 (*)    All other components within normal limits  TROPONIN I (HIGH SENSITIVITY) - Abnormal; Notable for the following  components:   Troponin I (High Sensitivity) 79 (*)    All other components within normal limits  TROPONIN I (HIGH SENSITIVITY) - Abnormal; Notable for the following components:   Troponin I (High Sensitivity) 85 (*)    All other components within normal limits  RESP PANEL BY RT-PCR (FLU A&B, COVID) ARPGX2  LACTIC ACID, PLASMA  BASIC METABOLIC PANEL   ____________________________________________  EKG Normal sinus rhythm, right axis deviation, normal intervals, no acute ischemic changes ____________________________________________  RADIOLOGY I, Madelin Headings, personally viewed and evaluated these images (plain radiographs) as part of my medical decision making, as well as reviewing the written report by the radiologist.  ED MD interpretation: Pulmonary edema, unchanged from prior    ____________________________________________   PROCEDURES  Procedure(s) performed (including Critical Care):  Procedures   ____________________________________________   INITIAL IMPRESSION / ASSESSMENT AND PLAN / ED COURSE     Patient is a 55 year old male with multiple comorbidities who is often noncompliant with dialysis who presents with abdominal pain generalized malaise and missed dialysis sessions.  Patient is hypertensive and somewhat tachycardic.  Appears chronically ill but not toxic.  His abdomen is distended he has known ascites and he is somewhat tender throughout but overall his exam is benign.  Patient had complained of bright red blood per rectum however he has brown stool on my rectal exam.  He is somewhat unclear about his last dialysis sessions and has clearly missed several.  EKG does not show any findings of hyperkalemia.  If his potassium is 6.2, BUN over 100.  His hemoglobin is stably low at 7.6.  His troponin is also elevated at 79, it is chronically elevated as this actually lower than it has been.  CT abdomen pelvis was obtained which shows the ascites but does not show  any acute findings.  Discussed with Dr. Zollie Scale nephrology on-call who recommends giving him  a potassium binder.  He was also given calcium, insulin, dextrose and Lasix.  He will be admitted to the hospitalist service for dialysis tomorrow morning.      ____________________________________________   FINAL CLINICAL IMPRESSION(S) / ED DIAGNOSES  Final diagnoses:  Hyperkalemia     ED Discharge Orders     None        Note:  This document was prepared using Dragon voice recognition software and may include unintentional dictation errors.    Rada Hay, MD 08/21/21 [phone]

## 2021-08-20 NOTE — ED Notes (Signed)
Patient returned from CT at this time.

## 2021-08-20 NOTE — ED Notes (Signed)
IV team requested.  RN x 2 attempted IV start and were unsuccessful.  Patient made aware of plan of care

## 2021-08-20 NOTE — H&P (Addendum)
History and Physical    Jordan [name] QMG:[id number] DOB: [birth year] DOA: 08/20/2021  PCP: [facility], [facility], MD   Patient coming from: home  I have personally briefly reviewed patient's relevant medical records in [place]  Chief Complaint: abdominal pain  HPI: Jordan [name] is a 55 y.o. male with medical history significant for ESRD on HD MWF, history of noncompliance with dialysis, chronic combined CHF, anemia of CKD, COPD on PRN 2.5L, with 4 recent hospitalizations for Serratia bacteremia, AV graft infection/graft removal/wound infection on IV cefepime on dialysis days but with AMA discharges each admission,,who now presents with Abdominal pain associated with distention and generalized malaise.  Pain is generalized, severe, nonradiating, crampy.  He has associated nausea without vomiting and denies diarrhea.  Denies dysuria.  Denies fever or chills.  Patient unsure whether he completed his last dialysis session.  He has mild shortness of breath but denies cough.  Denies chest pain. By the time of my evaluation.  Chest pain had improved but he was complaining mostly of generalized itching.  ED course: BP 167/121 with pulse 109.  Afebrile Blood work: WBC 10,000 with lactic acid 1.4.  Hemoglobin 7.6 which is baseline.  Potassium 6.2 and bicarb 28.  Anion gap 19.  EKG, personally reviewed and interpreted: Sinus tach at 103 with no acute ST-T wave changes  Chest x-ray with mild interstitial edema  CT abdomen and pelvis shows no significant change in large amount of ascites, diffuse mesenteric and body wall edema, stable moderate pericardial effusion, new ill-defined nodular opacities superior LLL and lingula most likely inflammatory or infectious with recommendation for repeat CT in 2 to 3 months  Patient treated with IV Lasix, dextrose and insulin and Veltassa The ED provider spoke with nephrologist Dr. Zollie Scale who will dialyze patient in the a.m.  Review of Systems: As per  HPI otherwise all other systems on review of systems negative.   Assessment/Plan Principal Problem:   Abdominal pain Active Problems:   Volume overload   Acute on chronic combined systolic and diastolic CHF (congestive heart failure) (HCC)   Hypertensive emergency   Non-compliance with renal dialysis (HCC)   Hyperkalemia   Elevated troponin   Anemia in ESRD (end-stage renal disease) (HCC)    Hyperkalemia -Potassium 6.2.  No EKG changes - Treated with dextrose and insulin and Veltassa in the ED - Continue to monitor   Abdominal pain  Stable Ascites, large  -- IR consult for paracentesis   Hypertensive urgency - BP control  ESRD on HD MWF -Nephrology consulted from the ED and they recommend IV Lasix, treat hyperkalemia and will dialyze in the a.m.   Acute on chronic diastolic and systolic heart failure  -Chest x-ray showing mild vascular congestion. - Expecting improvement with dialysis - Continue IV Lasix.  Patient still produces urine  Left AV fistula wound dehiscence/infection --S/p removal of infected AV fistula on 10/20 with Serratia bacteremia, complicated by wound dehiscence - Continue IV cefepime during dialysis - Treatment has been interrupted due to several AMA discharges and poor compliance with dialysis   Paroxysmal atrial fibrillation - Not on systemic coagulation - Continue metoprolol for rate control   COPD -has PRN 2.5L  -Not in acute exacerbation   Suspected crusted scabies -daily permethrin cream applied to entire body leave on for 8 hours before removal with showering   History of noncompliance with medical treatment - Patient has left AMA in the last 4 hospitalization before his AV fistula wound could be fully  treated   DVT prophylaxis: Lovenox  Code Status: full code  Family Communication:  none  Disposition Plan: Back to previous home environment Consults called: nephrology  Status:observation    Physical Exam: Vitals:   08/20/21  2000 08/20/21 2005 08/20/21 2030 08/20/21 2100  BP:  (!) 169/119 (!) 161/114 (!) 167/121  Pulse: 98 98 (!) 109 (!) 104  Resp: 19 19 18 19   Temp:      TempSrc:      SpO2: 100% 100% 100% 98%   Eyes: PERRL, lids and conjunctivae normal ENMT: Mucous membranes are moist.  Neck: normal, supple Respiratory: clear to auscultation bilaterally, no wheezing, no crackles.  Cardiovascular: Regular rate and rhythm, no murmurs / rubs / gallops. No extremity edema.  Abdomen: no tenderness, mild distention, no masses palpated. No hepatosplenomegaly. Bowel sounds positive.  Musculoskeletal: no clubbing / cyanosis. No joint deformity upper and lower extremities.  Skin:  Scattered circular crusted skin lesions throughout lower extremity with pruritus Neurologic: CN 2-12 grossly intact. Sensation intact, Strength 5/5 in all 4.  Psychiatric: Normal judgment and insight. Alert and oriented x 3. Normal mood.    Past Medical History:  Diagnosis Date   Anemia    Aortic atherosclerosis ([place]) 11/12/2019   ED (erectile dysfunction)    Emphysema of lung ([place]) 11/12/2019   ESRD on hemodialysis ([place])    M/W/F in [place], [place]   ETOH abuse    History of blood transfusion    Hypertension    Wears dentures     Past Surgical History:  Procedure Laterality Date   A/V FISTULAGRAM Left 07/15/2021   Procedure: A/V FISTULAGRAM;  Surgeon: Algernon Huxley, MD;  Location: [facility];  Service: Cardiovascular;  Laterality: Left;   BASCILIC VEIN TRANSPOSITION Left 08/07/2016   Procedure: LEFT BASILIC VEIN TRANSPOSITION;  Surgeon: Angelia Mould, MD;  Location: [facility];  Service: Vascular;  Laterality: Left;   CLOSED REDUCTION NASAL FRACTURE N/A 08/11/2019   Procedure: CLOSED REDUCTION NASAL FRACTURE WITH STABILIZATION;  Surgeon: Irene Limbo, MD;  Location: [facility];  Service: Plastics;  Laterality: N/A;   COLONOSCOPY N/A 08/12/2016   Procedure: COLONOSCOPY;  Surgeon: Otis Brace, MD;  Location: [facility];  Service: Gastroenterology;  Laterality: N/A;   COLONOSCOPY WITH PROPOFOL N/A 06/05/2021   Procedure: COLONOSCOPY WITH PROPOFOL;  Surgeon: Sharyn Creamer, MD;  Location: [facility];  Service: Gastroenterology;  Laterality: N/A;   DIALYSIS/PERMA CATHETER INSERTION N/A 07/26/2021   Procedure: DIALYSIS/PERMA CATHETER INSERTION;  Surgeon: Algernon Huxley, MD;  Location: [facility];  Service: Cardiovascular;  Laterality: N/A;   ESOPHAGOGASTRODUODENOSCOPY N/A 08/12/2016   Procedure: ESOPHAGOGASTRODUODENOSCOPY (EGD);  Surgeon: Otis Brace, MD;  Location: [facility];  Service: Gastroenterology;  Laterality: N/A;   ESOPHAGOGASTRODUODENOSCOPY (EGD) WITH PROPOFOL N/A 06/05/2021   Procedure: ESOPHAGOGASTRODUODENOSCOPY (EGD) WITH PROPOFOL;  Surgeon: Sharyn Creamer, MD;  Location: [facility];  Service: Gastroenterology;  Laterality: N/A;   INSERTION OF DIALYSIS CATHETER N/A 08/07/2016   Procedure: INSERTION OF TUNNELED DIALYSIS CATHETER;  Surgeon: Angelia Mould, MD;  Location: [facility];  Service: Vascular;  Laterality: N/A;   LIGATION OF ARTERIOVENOUS  FISTULA Left 09/12/2016   Procedure: BANDING OF LEFT  ARTERIOVENOUS  FISTULA;  Surgeon: Angelia Mould, MD;  Location: [facility];  Service: Vascular;  Laterality: Left;   LIGATION OF ARTERIOVENOUS  FISTULA Left 07/17/2021   Procedure: LIGATION OF ARTERIOVENOUS  FISTULA;  Surgeon: Katha Cabal, MD;  Location: [facility];  Service: Vascular;  Laterality:  Left;  also include placement of temporary dialysis catheter right or left groin   LOWER EXTREMITY INTERVENTION Right 12/02/2018   Procedure: LOWER EXTREMITY INTERVENTION;  Surgeon: Algernon Huxley, MD;  Location: [facility];  Service: Cardiovascular;  Laterality: Right;   POLYPECTOMY  06/05/2021   Procedure: POLYPECTOMY;  Surgeon: Sharyn Creamer, MD;  Location: [facility];  Service: Gastroenterology;;   TEMPORARY DIALYSIS CATHETER Left 07/17/2021   Procedure: TEMPORARY  DIALYSIS CATHETER;  Surgeon: Katha Cabal, MD;  Location: [facility];  Service: Vascular;  Laterality: Left;     reports that he has been smoking cigarettes. He has a 20.00 pack-year smoking history. He has never used smokeless tobacco. He reports that he does not currently use alcohol after a past usage of about 21.0 standard drinks per week. He reports that he does not use drugs.  Allergies  Allergen Reactions   Lisinopril Swelling    Family History  Problem Relation Age of Onset   Diabetes Mother    Kidney failure Mother    Healthy Father    Kidney failure Brother    Healthy Sister    Kidney disease Daughter    Post-traumatic stress disorder Neg Hx    Bladder Cancer Neg Hx    Kidney cancer Neg Hx       Prior to Admission medications   Medication Sig Start Date End Date Taking? Authorizing Provider  albuterol (VENTOLIN HFA) 108 (90 Base) MCG/ACT inhaler Inhale 2 puffs into the lungs every 4 (four) hours as needed for wheezing or shortness of breath. 06/05/21   Danford, Suann Larry, MD  calcium acetate (PHOSLO) 667 MG capsule Take 2 capsules (1,334 mg total) by mouth 3 (three) times daily with meals. 06/05/21   Danford, Suann Larry, MD  cinacalcet (SENSIPAR) 30 MG tablet Take 1 tablet (30 mg total) by mouth daily with breakfast. 06/05/21   Danford, Suann Larry, MD  docusate sodium (COLACE) 100 MG capsule Take 1 capsule (100 mg total) by mouth 2 (two) times daily. 06/05/21   Edwin Dada, MD  epoetin alfa (EPOGEN) 4000 UNIT/ML injection Inject 1 mL (4,000 Units total) into the vein every Monday, Wednesday, and Friday with hemodialysis. 06/22/20   Enzo Bi, MD  ferrous sulfate 325 (65 FE) MG tablet Take 1 tablet (325 mg total) by mouth 2 (two) times daily with a meal. 06/05/21   Danford, Suann Larry, MD  metoprolol tartrate (LOPRESSOR) 50 MG tablet Take 1 tablet (50 mg total) by mouth 2 (two) times daily. 07/04/21   Jennye Boroughs, MD  pantoprazole (PROTONIX) 20 MG  tablet Take 1 tablet (20 mg total) by mouth 2 (two) times daily before a meal. 06/05/21 08/07/21  Danford, Suann Larry, MD      Labs on Admission: I have personally reviewed following labs and imaging studies  CBC: Recent Labs  Lab 08/20/21 2006  WBC 10.7*  NEUTROABS 8.9*  HGB 7.6*  HCT 25.1*  MCV 102.9*  PLT 254   Basic Metabolic Panel: Recent Labs  Lab 08/20/21 2006  NA 142  K 6.2*  CL 103  CO2 20*  GLUCOSE 74  BUN 104*  CREATININE 12.97*  CALCIUM 9.6   GFR: Estimated Creatinine Clearance: 6 mL/min (A) (by C-G formula based on SCr of 12.97 mg/dL (H)). Liver Function Tests: Recent Labs  Lab 08/20/21 2006  AST 16  ALT 12  ALKPHOS 106  BILITOT 1.3*  PROT 8.2*  ALBUMIN 3.6   No results for input(s): LIPASE, AMYLASE  in the last 168 hours. No results for input(s): AMMONIA in the last 168 hours. Coagulation Profile: No results for input(s): INR, PROTIME in the last 168 hours. Cardiac Enzymes: No results for input(s): CKTOTAL, CKMB, CKMBINDEX, TROPONINI in the last 168 hours. BNP (last 3 results) No results for input(s): PROBNP in the last 8760 hours. HbA1C: No results for input(s): HGBA1C in the last 72 hours. CBG: No results for input(s): GLUCAP in the last 168 hours. Lipid Profile: No results for input(s): CHOL, HDL, LDLCALC, TRIG, CHOLHDL, LDLDIRECT in the last 72 hours. Thyroid Function Tests: No results for input(s): TSH, T4TOTAL, FREET4, T3FREE, THYROIDAB in the last 72 hours. Anemia Panel: No results for input(s): VITAMINB12, FOLATE, FERRITIN, TIBC, IRON, RETICCTPCT in the last 72 hours. Urine analysis:    Component Value Date/Time   COLORURINE YELLOW (A) 01/13/2021 0153   APPEARANCEUR CLEAR (A) 01/13/2021 0153   LABSPEC 1.012 01/13/2021 0153   PHURINE 9.0 (H) 01/13/2021 0153   GLUCOSEU 150 (A) 01/13/2021 0153   HGBUR NEGATIVE 01/13/2021 0153   BILIRUBINUR NEGATIVE 01/13/2021 0153   KETONESUR NEGATIVE 01/13/2021 0153   PROTEINUR >=300 (A)  01/13/2021 0153   NITRITE NEGATIVE 01/13/2021 0153   LEUKOCYTESUR NEGATIVE 01/13/2021 0153    Radiological Exams on Admission: CT ABDOMEN PELVIS WO CONTRAST  Result Date: 08/20/2021 CLINICAL DATA:  Abdominal pain and distention. End-stage renal disease on dialysis. EXAM: CT ABDOMEN AND PELVIS WITHOUT CONTRAST TECHNIQUE: Multidetector CT imaging of the abdomen and pelvis was performed following the standard protocol without IV contrast. COMPARISON:  05/10/2021 and 05/03/2021 FINDINGS: Lower chest: Multiple ill-defined nodular opacities are seen in the superior left lower lobe and lingula, which are new since previous studies several months ago, and most likely inflammatory or infectious in etiology. A moderate pericardial effusion shows no significant change. Hepatobiliary: No mass visualized on this unenhanced exam. Gallbladder is unremarkable no evidence of biliary ductal dilatation. Pancreas: No mass or inflammatory process visualized on this unenhanced exam. Spleen:  Within normal limits in size. Adrenals/Urinary tract: Severe diffuse bilateral renal parenchymal atrophy is consistent with chronic end-stage renal disease. No evidence of hydronephrosis. Urinary bladder is nearly completely empty. Stomach/Bowel: Unremarkable. Vascular/Lymphatic: No pathologically enlarged lymph nodes identified. No evidence of abdominal aortic aneurysm. Aortic atherosclerotic calcification noted. Mild dilatation of the right common iliac artery is again seen measuring 1.9 cm. 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 Reproductive:  Stable mild to moderately enlarged prostate. Other: Large amount of ascites and diffuse mesenteric and body wall edema show no significant change. Musculoskeletal: No suspicious bone lesions identified. Diffuse osteosclerosis, consistent with renal osteodystrophy. IMPRESSION: No significant change in large amount of ascites, and  diffuse mesenteric and body wall edema. Stable moderate pericardial effusion. New ill-defined nodular opacities in superior left lower lobe and lingula, most likely inflammatory or infectious in etiology. Continued follow-up by chest CT is recommended in 2-3 months. 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 Electronically Signed   By: Marlaine Hind M.D.   On: 08/20/2021 20:20   DG Chest Portable 1 View  Result Date: 08/20/2021 CLINICAL DATA:  Chest pain EXAM: PORTABLE CHEST 1 VIEW COMPARISON:  08/10/2021 FINDINGS: Moderate cardiomegaly. Unchanged position of right chest wall dual lumen catheter. Mild interstitial pulmonary edema is unchanged. No focal consolidation. No pleural effusion or pneumothorax. IMPRESSION: Cardiomegaly and mild interstitial pulmonary edema, unchanged. Electronically Signed   By: Ulyses Jarred M.D.   On: 08/20/2021 19:45  Athena Masse MD [facility]   08/20/2021, 9:34 PM

## 2021-08-20 NOTE — ED Triage Notes (Signed)
Patient BIB EMS for evaluation of abdominal pain and distention, has been dialysis x unknown about of days, dialysis catheter to R chest is uncovered, and has drainage to L upper arm surgery site.  No reports of fever.  States he has been unable to take care of himself.

## 2021-08-20 NOTE — ED Notes (Signed)
Patient L upper arm wound draining per patient.  Wound care completed and arm wrapped

## 2021-08-21 ENCOUNTER — Observation Stay: Payer: Medicare Other

## 2021-08-21 DIAGNOSIS — D631 Anemia in chronic kidney disease: Secondary | ICD-10-CM

## 2021-08-21 DIAGNOSIS — R109 Unspecified abdominal pain: Secondary | ICD-10-CM | POA: Diagnosis not present

## 2021-08-21 DIAGNOSIS — I5043 Acute on chronic combined systolic (congestive) and diastolic (congestive) heart failure: Secondary | ICD-10-CM | POA: Diagnosis not present

## 2021-08-21 DIAGNOSIS — N186 End stage renal disease: Secondary | ICD-10-CM | POA: Diagnosis not present

## 2021-08-21 DIAGNOSIS — R1084 Generalized abdominal pain: Secondary | ICD-10-CM | POA: Diagnosis not present

## 2021-08-21 LAB — BASIC METABOLIC PANEL
Anion gap: 13 (ref 5–15)
BUN: 55 mg/dL — ABNORMAL HIGH (ref 6–20)
CO2: 24 mmol/L (ref 22–32)
Calcium: 9.1 mg/dL (ref 8.9–10.3)
Chloride: 98 mmol/L (ref 98–111)
Creatinine, Ser: 7.68 mg/dL — ABNORMAL HIGH (ref 0.61–1.24)
GFR, Estimated: 8 mL/min — ABNORMAL LOW (ref >60)
Glucose, Bld: 42 mg/dL — CL (ref 70–99)
Potassium: 4.3 mmol/L (ref 3.5–5.1)
Sodium: 135 mmol/L (ref 135–145)

## 2021-08-21 LAB — BODY FLUID CELL COUNT WITH DIFFERENTIAL
Eos, Fluid: 0 %
Lymphs, Fluid: 23 %
Monocyte-Macrophage-Serous Fluid: 70 %
Neutrophil Count, Fluid: 7 %
Total Nucleated Cell Count, Fluid: 32 cu mm

## 2021-08-21 LAB — RESP PANEL BY RT-PCR (FLU A&B, COVID) ARPGX2
Influenza A by PCR: NEGATIVE
Influenza B by PCR: NEGATIVE
SARS Coronavirus 2 by RT PCR: NEGATIVE

## 2021-08-21 LAB — LACTIC ACID, PLASMA: Lactic Acid, Venous: 2.1 mmol/L (ref 0.5–1.9)

## 2021-08-21 LAB — GLUCOSE, CAPILLARY: Glucose-Capillary: 98 mg/dL (ref 70–99)

## 2021-08-21 LAB — TROPONIN I (HIGH SENSITIVITY): Troponin I (High Sensitivity): 85 ng/L — ABNORMAL HIGH (ref <18)

## 2021-08-21 LAB — CBG MONITORING, ED: Glucose-Capillary: 66 mg/dL — ABNORMAL LOW (ref 70–99)

## 2021-08-21 MED ORDER — GLUCOSE 4 G PO CHEW
CHEWABLE_TABLET | ORAL | Status: AC
  Filled 2021-08-21: qty 1

## 2021-08-21 MED ORDER — CHLORHEXIDINE GLUCONATE CLOTH 2 % EX PADS
6.0000 | MEDICATED_PAD | Freq: Every day | CUTANEOUS | Status: DC
  Administered 2021-08-21 – 2021-08-22 (×2): 6 via TOPICAL
  Filled 2021-08-21 (×2): qty 6

## 2021-08-21 MED ORDER — HYDROXYZINE HCL 25 MG PO TABS
25.0000 mg | ORAL_TABLET | Freq: Four times a day (QID) | ORAL | Status: DC | PRN
  Administered 2021-08-21: 25 mg via ORAL
  Filled 2021-08-21: qty 1

## 2021-08-21 MED ORDER — OXYCODONE-ACETAMINOPHEN 5-325 MG PO TABS: 1.0000 | ORAL_TABLET | ORAL | Status: DC | PRN

## 2021-08-21 MED ORDER — HEPARIN SODIUM (PORCINE) 1000 UNIT/ML IJ SOLN
INTRAMUSCULAR | Status: AC
  Filled 2021-08-21: qty 10

## 2021-08-21 MED ORDER — DEXTROSE 50 % IV SOLN
50.0000 mL | Freq: Once | INTRAVENOUS | Status: AC
  Administered 2021-08-21: 50 mL via INTRAVENOUS
  Filled 2021-08-21: qty 50

## 2021-08-21 NOTE — ED Notes (Signed)
Admitting MD at bedside.

## 2021-08-21 NOTE — Progress Notes (Signed)
PROGRESS NOTE    Jordan [name]  MGQ:[id number] DOB: 1965-12-07 DOA: 08/20/2021 PCP: [facility], [facility], MD    Brief Narrative:  Jordan [name] is a 55 y.o. male with medical history significant for ESRD on HD MWF, history of noncompliance with dialysis, chronic combined CHF, anemia of CKD, COPD on PRN 2.5L, with 4 recent hospitalizations for Serratia bacteremia, AV graft infection/graft removal/wound infection on IV cefepime on dialysis days but with AMA discharges each admission,,who now presents with Abdominal pain associated with distention and generalized malaise.    Assessment & Plan:   Principal Problem:   Abdominal pain Active Problems:   Hypertensive emergency   Elevated troponin   Anemia in ESRD (end-stage renal disease) (HCC)   Volume overload   Acute on chronic combined systolic and diastolic CHF (congestive heart failure) (HCC)   Non-compliance with renal dialysis (HCC)   Hyperkalemia  End-stage renal disease. Hyperkalemia  Anemia of chronic kidney disease Patient has not been compliant with dialysis. Patient will be seen by nephrology, received Veltassa, insulin and D50. Will be dialyzed again today.  Abdominal pain. Large ascites. Patient abdominal pain seem to be improved since had a bowel movement last night.  No abdominal tenderness.  No evidence of peritonitis. Will have paracentesis scheduled for today.  Acute on chronic diastolic and systolic congestive heart failure. Patient will be dialyzed again today.  Left AV fistula wound dehiscence/infection. Patient still on IV cefepime.  Paroxysmal atrial fibrillation. Continue metoprolol.  COPD. Chronic hypoxemic respiratory failure. Continue oxygen treatment as needed.    DVT prophylaxis: Lovenox Code Status: full Family Communication:  Disposition Plan:    Status is: Observation         I/O last 3 completed shifts: In: 37 [IV Piggyback:50] Out: -  Total I/O In: -  Out: -267       Consultants:  nephrology  Procedures: HD  Antimicrobials: Cefepime.  Subjective: Patient feels tired, n.p.o. pending paracentesis. Also pending hemodialysis. Abdominal pain has resolved since last night after bowel movement.  No nausea vomiting. No fever chills No short of breath.  Objective: Vitals:   08/21/21 1230 08/21/21 1245 08/21/21 1300 08/21/21 1400  BP: (!) 136/97 (!) 152/96 (!) 138/101 (!) 148/99  Pulse: 81 80 84 78  Resp: 17 19 15 18   Temp:      TempSrc:      SpO2:    90%    Intake/Output Summary (Last 24 hours) at 08/21/2021 1459 Last data filed at 08/21/2021 1315 Gross per 24 hour  Intake 50 ml  Output -497 ml  Net 547 ml   There were no vitals filed for this visit.  Examination:  General exam: Appears calm and comfortable  Respiratory system: Clear to auscultation. Respiratory effort normal. Cardiovascular system: S1 & S2 heard, RRR. No JVD, murmurs, rubs, gallops or clicks. No pedal edema. Gastrointestinal system: Abdomen is nondistended, soft and nontender. No organomegaly or masses felt. Normal bowel sounds heard. Central nervous system: Alert and oriented. No focal neurological deficits. Extremities: Symmetric 5 x 5 power. Skin: No rashes, lesions or ulcers Psychiatry: Mood & affect appropriate.     Data Reviewed: I have personally reviewed following labs and imaging studies  CBC: Recent Labs  Lab 08/20/21 2006  WBC 10.7*  NEUTROABS 8.9*  HGB 7.6*  HCT 25.1*  MCV 102.9*  PLT 124   Basic Metabolic Panel: Recent Labs  Lab 08/20/21 2006  NA 142  K 6.2*  CL 103  CO2 20*  GLUCOSE 74  BUN 104*  CREATININE 12.97*  CALCIUM 9.6  MG 2.8*  PHOS 8.2*   GFR: Estimated Creatinine Clearance: 6 mL/min (A) (by C-G formula based on SCr of 12.97 mg/dL (H)). Liver Function Tests: Recent Labs  Lab 08/20/21 2006  AST 16  ALT 12  ALKPHOS 106  BILITOT 1.3*  PROT 8.2*  ALBUMIN 3.6   No results for input(s): LIPASE, AMYLASE in the  last 168 hours. No results for input(s): AMMONIA in the last 168 hours. Coagulation Profile: No results for input(s): INR, PROTIME in the last 168 hours. Cardiac Enzymes: No results for input(s): CKTOTAL, CKMB, CKMBINDEX, TROPONINI in the last 168 hours. BNP (last 3 results) No results for input(s): PROBNP in the last 8760 hours. HbA1C: No results for input(s): HGBA1C in the last 72 hours. CBG: No results for input(s): GLUCAP in the last 168 hours. Lipid Profile: No results for input(s): CHOL, HDL, LDLCALC, TRIG, CHOLHDL, LDLDIRECT in the last 72 hours. Thyroid Function Tests: No results for input(s): TSH, T4TOTAL, FREET4, T3FREE, THYROIDAB in the last 72 hours. Anemia Panel: No results for input(s): VITAMINB12, FOLATE, FERRITIN, TIBC, IRON, RETICCTPCT in the last 72 hours. Sepsis Labs: Recent Labs  Lab 08/20/21 2006 08/20/21 2237  LATICACIDVEN 1.4 2.1*    Recent Results (from the past 240 hour(s))  Resp Panel by RT-PCR (Flu A&B, Covid) Nasopharyngeal Swab     Status: None   Collection Time: 08/20/21 10:37 PM   Specimen: Nasopharyngeal Swab; Nasopharyngeal(NP) swabs in vial transport medium  Result Value Ref Range Status   SARS Coronavirus 2 by RT PCR NEGATIVE NEGATIVE Final    Comment: (NOTE) SARS-CoV-2 target nucleic acids are NOT DETECTED.  The SARS-CoV-2 RNA is generally detectable in upper respiratory specimens during the acute phase of infection. The lowest concentration of SARS-CoV-2 viral copies this assay can detect is 138 copies/mL. A negative result does not preclude SARS-Cov-2 infection and should not be used as the sole basis for treatment or other patient management decisions. A negative result may occur with  improper specimen collection/handling, submission of specimen other than nasopharyngeal swab, presence of viral mutation(s) within the areas targeted by this assay, and inadequate number of viral copies(<138 copies/mL). A negative result must be  combined with clinical observations, patient history, and epidemiological information. The expected result is Negative.  Fact Sheet for Patients:  EntrepreneurPulse.com.au  Fact Sheet for Healthcare Providers:  IncredibleEmployment.be  This test is no t yet approved or cleared by the [place] FDA and  has been authorized for detection and/or diagnosis of SARS-CoV-2 by FDA under an Emergency Use Authorization (EUA). This EUA will remain  in effect (meaning this test can be used) for the duration of the COVID-19 declaration under Section 564(b)(1) of the Act, 21 U.S.C.section 360bbb-3(b)(1), unless the authorization is terminated  or revoked sooner.       Influenza A by PCR NEGATIVE NEGATIVE Final   Influenza B by PCR NEGATIVE NEGATIVE Final    Comment: (NOTE) The Xpert Xpress SARS-CoV-2/FLU/RSV plus assay is intended as an aid in the diagnosis of influenza from Nasopharyngeal swab specimens and should not be used as a sole basis for treatment. Nasal washings and aspirates are unacceptable for Xpert Xpress SARS-CoV-2/FLU/RSV testing.  Fact Sheet for Patients: EntrepreneurPulse.com.au  Fact Sheet for Healthcare Providers: IncredibleEmployment.be  This test is not yet approved or cleared by the [place] FDA and has been authorized for detection and/or diagnosis of SARS-CoV-2 by FDA under an Emergency Use Authorization (EUA). This EUA will  remain in effect (meaning this test can be used) for the duration of the COVID-19 declaration under Section 564(b)(1) of the Act, 21 U.S.C. section 360bbb-3(b)(1), unless the authorization is terminated or revoked.  Performed at [facility], [address] [place], [place] [address]          Radiology Studies: CT ABDOMEN PELVIS WO CONTRAST  Result Date: 08/20/2021 CLINICAL DATA:  Abdominal pain and distention. End-stage renal disease on  dialysis. EXAM: CT ABDOMEN AND PELVIS WITHOUT CONTRAST TECHNIQUE: Multidetector CT imaging of the abdomen and pelvis was performed following the standard protocol without IV contrast. COMPARISON:  05/10/2021 and 05/03/2021 FINDINGS: Lower chest: Multiple ill-defined nodular opacities are seen in the superior left lower lobe and lingula, which are new since previous studies several months ago, and most likely inflammatory or infectious in etiology. A moderate pericardial effusion shows no significant change. Hepatobiliary: No mass visualized on this unenhanced exam. Gallbladder is unremarkable no evidence of biliary ductal dilatation. Pancreas: No mass or inflammatory process visualized on this unenhanced exam. Spleen:  Within normal limits in size. Adrenals/Urinary tract: Severe diffuse bilateral renal parenchymal atrophy is consistent with chronic end-stage renal disease. No evidence of hydronephrosis. Urinary bladder is nearly completely empty. Stomach/Bowel: Unremarkable. Vascular/Lymphatic: No pathologically enlarged lymph nodes identified. No evidence of abdominal aortic aneurysm. Aortic atherosclerotic calcification noted. Mild dilatation of the right common iliac artery is again seen measuring 1.9 cm. 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 Reproductive:  Stable mild to moderately enlarged prostate. Other: Large amount of ascites and diffuse mesenteric and body wall edema show no significant change. Musculoskeletal: No suspicious bone lesions identified. Diffuse osteosclerosis, consistent with renal osteodystrophy. IMPRESSION: No significant change in large amount of ascites, and diffuse mesenteric and body wall edema. Stable moderate pericardial effusion. New ill-defined nodular opacities in superior left lower lobe and lingula, most likely inflammatory or infectious in etiology. Continued follow-up by chest CT is recommended in 2-3 months.  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 Electronically Signed   By: Marlaine Hind M.D.   On: 08/20/2021 20:20   DG Chest Portable 1 View  Result Date: 08/20/2021 CLINICAL DATA:  Chest pain EXAM: PORTABLE CHEST 1 VIEW COMPARISON:  08/10/2021 FINDINGS: Moderate cardiomegaly. Unchanged position of right chest wall dual lumen catheter. Mild interstitial pulmonary edema is unchanged. No focal consolidation. No pleural effusion or pneumothorax. IMPRESSION: Cardiomegaly and mild interstitial pulmonary edema, unchanged. Electronically Signed   By: Ulyses Jarred M.D.   On: 08/20/2021 19:45        Scheduled Meds:  calcium acetate  1,334 mg Oral TID WC   Chlorhexidine Gluconate Cloth  6 each Topical Q0600   cinacalcet  30 mg Oral Q breakfast   furosemide  40 mg Intravenous BID   heparin sodium (porcine)       metoprolol tartrate  50 mg Oral BID   pantoprazole  20 mg Oral BID AC   patiromer  16.8 g Oral Daily   sodium chloride flush  3 mL Intravenous Q12H   Continuous Infusions:  sodium chloride       LOS: 0 days    Time spent: 28 minutes    Sharen Hones, MD [facility]   To contact the attending provider between 7A-7P or the covering provider during after hours 7P-7A, please log into the web site [email or web address] and access using universal [place] password for that web site. If you do not have  the password, please call the hospital operator.  08/21/2021, 2:59 PM

## 2021-08-21 NOTE — Progress Notes (Signed)
Hypoglycemic Event  CBG: 51  Treatment: 8 oz juice/soda  Symptoms: None  Follow-up CBG: Time:1818 CBG Result:98  Possible Reasons for Event: Other: Patient NPO overnight until this evening for procedure.  Comments/MD notified: Roosevelt Locks, MD notified.     Anda Kraft

## 2021-08-21 NOTE — TOC Initial Note (Signed)
Transition of Care [facility]) - Initial/Assessment Note    Patient Details  Name: Jordan [name] MRN: [id number] Date of Birth: Dec 30, 1965  Transition of Care [facility]) CM/SW Contact:    Ova Freshwater Phone Number: [phone] 08/21/2021, 5:10 PM  Clinical Narrative:                  Patient presents to [facility] from home due to abdominal pain and distention, hx of non compliance with dialysis. Patient lives at home and states he is unable to care for himself at home.  CSW attempted to speak with patient but unable to.  CSW requested ED RN give patient my contact number for him to call me. Patient did not contact patient.  CSW updated Attending and ED RN, of the possible discharge plan, including home health and PT evaluation for SNF placement, and possibility of ALF placement.  Expected Discharge Plan: [place] Barriers to Discharge: Continued Medical Work up   Patient Goals and CMS Choice        Expected Discharge Plan and Services Expected Discharge Plan: [place] In-house Referral: Clinical Social Work   Post Acute Care Choice: [place] arrangements for the past 2 months: [place]                                      Prior Living Arrangements/Services Living arrangements for the past 2 months: Single Family Home Lives with:: Self Patient language and need for interpreter reviewed:: Yes Do you feel safe going back to the place where you live?: No   [name] lives alone, and states he is unabel to care for himself.  Patient has three siblings.  Need for Family Participation in Patient Care: Yes (Comment) Care giver support system in place?: Yes (comment)   Criminal Activity/Legal Involvement Pertinent to Current Situation/Hospitalization: No - Comment as needed  Activities of Daily Living      Permission Sought/Granted Permission sought to share information with : Family Supports Permission granted to  share information with : Yes, Verbal Permission Granted  Share Information with NAME: Cleatis, Fandrich (Brother)   [phone] (Mobile)           Emotional Assessment Appearance:: Appears older than stated age Attitude/Demeanor/Rapport: Engaged Affect (typically observed): Stable Orientation: : Oriented to Self, Oriented to Place, Oriented to  Time, Oriented to Situation Alcohol / Substance Use: Not Applicable Psych Involvement: No (comment)  Admission diagnosis:  Hyperkalemia [E87.5] Patient Active Problem List   Diagnosis Date Noted   Scabies 08/11/2021   Cellulitis, wound, post-operative 08/10/2021   Surgical wound infection 08/07/2021   Infection due to Serratia marcescens 08/07/2021   Wound infection after surgery 08/07/2021   CAP (community acquired pneumonia) 07/30/2021   ABLA (acute blood loss anemia) 07/17/2021   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 ([place]) 07/17/2021   AV fistula infection, 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/17/2021   AV fistula thrombosis, 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/17/2021   Pressure injury of skin 07/15/2021   Acute on chronic respiratory failure with hypoxemia ([place]) 07/12/2021   Ascites    Protein-calorie malnutrition, severe 06/05/2021   Hypervolemia associated with renal insufficiency 06/02/2021   Symptomatic anemia 05/28/2021   Acute on chronic diastolic CHF (congestive heart failure) ([place]) 05/28/2021   Hyperkalemia 05/28/2021   Pulmonary edema 04/29/2021   Pruritus 04/29/2021   COVID-19 virus infection 04/29/2021   Thrombocytopenia ([place])  04/08/2021   COPD (chronic obstructive pulmonary disease) ([place]) 04/08/2021   CHF (congestive heart failure) ([place]) 03/25/2021   PAF (paroxysmal atrial fibrillation) ([place]) 03/20/2021   Chronic diastolic CHF (congestive heart failure) ([place]) 03/20/2021   Alcohol abuse 03/20/2021   Vision loss of left eye 03/20/2021   SOB (shortness of breath) 03/20/2021   Shortness of breath 03/10/2021   Abscess of  buttock 03/02/2021   Fluid overload 03/02/2021   Multifocal pneumonia 01/13/2021   Abdominal pain 06/19/2020   Current mild episode of major depressive disorder without prior episode ([place]) 04/09/2020   Atrial fibrillation with RVR ([place]) 04/04/2020   Non-compliance with renal dialysis ([place]) 03/21/2020   Acute CHF (congestive heart failure) ([place]) 03/12/2020   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 ([place]) 02/03/2020   Acute on chronic combined systolic and diastolic CHF (congestive heart failure) ([place]) 02/03/2020   Acute on chronic respiratory failure with hypoxia ([place]) 11/12/2019   Aortic atherosclerosis ([place]) 11/12/2019   Emphysema of lung ([place]) 11/12/2019   Volume overload 10/05/2019   Elevated troponin 05/17/2018   Anemia in ESRD (end-stage renal disease) ([place]) 05/17/2018   ESRD on dialysis ([place]) 05/26/2017   Essential hypertension 05/26/2017   Moderate protein-calorie malnutrition ([place]) 08/13/2016   Secondary hyperparathyroidism of renal origin ([place]) 08/13/2016   Tobacco abuse 08/04/2016   Hypertensive emergency    PCP:  Hoy Register, MD Pharmacy:   [facility], [place] - [address] [place] [address] Phone: [phone] Fax: [phone]  [facility], [place] - [address], [address] [address] Phone: [phone] Fax: [phone]     Social Determinants of Health (SDOH) Interventions    Readmission Risk Interventions Readmission Risk Prevention Plan 07/22/2021  Transportation Screening Complete  Medication Review ([place]) Complete  PCP or Specialist appointment within 3-5 days of discharge Complete  SW Recovery Care/Counseling Consult Complete  [place] Not Applicable  Some recent data might be hidden

## 2021-08-21 NOTE — ED Notes (Signed)
Report to dialysis RN

## 2021-08-21 NOTE — Procedures (Signed)
  Procedure: US paracentesis RLQ EBL:   minimal Complications:  none immediate  See full dictation in [facility].  Dillard Cannon MD Main # [phone] Pager  [phone] Mobile [phone]

## 2021-08-21 NOTE — ED Notes (Signed)
Two attempts to draw morning labs that were unsuccessful.

## 2021-08-21 NOTE — ED Notes (Signed)
Lab called and states that pt is on the list for morning lab draws.

## 2021-08-21 NOTE — ED Notes (Signed)
Pt to dialysis.

## 2021-08-21 NOTE — ED Notes (Signed)
Pt reports feeling nauseated and having abdominal pain.

## 2021-08-21 NOTE — ED Notes (Signed)
Pt peri care provided, linens and brief changed from BM. Dr Marko Stai regarding concern for pt d/c d/t inability to toilet self and care for self. States lives home alone and no family to help

## 2021-08-21 NOTE — Progress Notes (Signed)
[facility]  ROUNDING NOTE   Subjective:   Jordan [name] is a 55 year old male with past medical history including COPD, chronic combined CHF, and end-stage renal disease with known noncompliance with dialysis regimen.  Patient presents to the emergency department via EMS with complaints of abdominal pain, nausea, vomiting and generalized malaise.  Patient is currently admitted under observation status.  Patient is known to our office from multiple previous admissions.  His assigned outpatient dialysis clinic is [facility] on a Monday Wednesday Friday schedule.  His last dialysis treatment was during his last admission.  He is currently complaining of abdominal pain and nausea.  Denies diarrhea, fever, and chills.  States he has shortness of breath with dry cough.  Labs on admission include sodium 142, potassium 6.2, BUN 104, creatinine 12.97 with GFR for, phosphorus 8.2, magnesium 2.8, and hemoglobin 7.6 with a white blood cell 10.7.  Chest x-ray shows mild pulmonary edema  Patient seen and evaluated later by supervising physician during dialysis   HEMODIALYSIS FLOWSHEET:  Blood Flow Rate (mL/min): 400 mL/min Arterial Pressure (mmHg): -130 mmHg Venous Pressure (mmHg): 150 mmHg Transmembrane Pressure (mmHg): 80 mmHg Ultrafiltration Rate (mL/min): 1000 mL/min Dialysate Flow Rate (mL/min): 500 ml/min Conductivity: Machine : 13.4 Conductivity: Machine : 13.4    Objective:  Vital signs in last 24 hours:  Temp:  [97.7 F (36.5 C)] 97.7 F (36.5 C) (11/30 1000) Pulse Rate:  [66-109] 78 (11/30 1400) Resp:  [14-22] 18 (11/30 1400) BP: (120-174)/(74-122) 148/99 (11/30 1400) SpO2:  [77 %-100 %] 90 % (11/30 1400)  Weight change:  There were no vitals filed for this visit.  Intake/Output: I/O last 3 completed shifts: In: 49 [IV Piggyback:50] Out: -    Intake/Output this shift:  Total I/O In: -  Out: -497   Physical Exam: General: NAD, resting  comfortably  Head: Normocephalic, atraumatic. Moist oral mucosal membranes  Eyes: Anicteric  Lungs:  Mild basilar crackles, normal effort, 4 L O2  Heart: Regular rate and rhythm  Abdomen:  Soft, tender, mild distention  Extremities: No peripheral edema.  Neurologic: Nonfocal, moving all four extremities  Skin: Generalized abrasions  Access: Right IJ PermCath    Basic Metabolic Panel: Recent Labs  Lab 08/20/21 2006  NA 142  K 6.2*  CL 103  CO2 20*  GLUCOSE 74  BUN 104*  CREATININE 12.97*  CALCIUM 9.6  MG 2.8*  PHOS 8.2*    Liver Function Tests: Recent Labs  Lab 08/20/21 2006  AST 16  ALT 12  ALKPHOS 106  BILITOT 1.3*  PROT 8.2*  ALBUMIN 3.6   No results for input(s): LIPASE, AMYLASE in the last 168 hours. No results for input(s): AMMONIA in the last 168 hours.  CBC: Recent Labs  Lab 08/20/21 2006  WBC 10.7*  NEUTROABS 8.9*  HGB 7.6*  HCT 25.1*  MCV 102.9*  PLT 152    Cardiac Enzymes: No results for input(s): CKTOTAL, CKMB, CKMBINDEX, TROPONINI in the last 168 hours.  BNP: Invalid input(s): POCBNP  CBG: No results for input(s): GLUCAP in the last 168 hours.  Microbiology: Results for orders placed or performed during the hospital encounter of 08/20/21  Resp Panel by RT-PCR (Flu A&B, Covid) Nasopharyngeal Swab     Status: None   Collection Time: 08/20/21 10:37 PM   Specimen: Nasopharyngeal Swab; Nasopharyngeal(NP) swabs in vial transport medium  Result Value Ref Range Status   SARS Coronavirus 2 by RT PCR NEGATIVE NEGATIVE Final    Comment: (NOTE) SARS-CoV-2  target nucleic acids are NOT DETECTED.  The SARS-CoV-2 RNA is generally detectable in upper respiratory specimens during the acute phase of infection. The lowest concentration of SARS-CoV-2 viral copies this assay can detect is 138 copies/mL. A negative result does not preclude SARS-Cov-2 infection and should not be used as the sole basis for treatment or other patient management  decisions. A negative result may occur with  improper specimen collection/handling, submission of specimen other than nasopharyngeal swab, presence of viral mutation(s) within the areas targeted by this assay, and inadequate number of viral copies(<138 copies/mL). A negative result must be combined with clinical observations, patient history, and epidemiological information. The expected result is Negative.  Fact Sheet for Patients:  EntrepreneurPulse.com.au  Fact Sheet for Healthcare Providers:  IncredibleEmployment.be  This test is no t yet approved or cleared by the [place] FDA and  has been authorized for detection and/or diagnosis of SARS-CoV-2 by FDA under an Emergency Use Authorization (EUA). This EUA will remain  in effect (meaning this test can be used) for the duration of the COVID-19 declaration under Section 564(b)(1) of the Act, 21 U.S.C.section 360bbb-3(b)(1), unless the authorization is terminated  or revoked sooner.       Influenza A by PCR NEGATIVE NEGATIVE Final   Influenza B by PCR NEGATIVE NEGATIVE Final    Comment: (NOTE) The Xpert Xpress SARS-CoV-2/FLU/RSV plus assay is intended as an aid in the diagnosis of influenza from Nasopharyngeal swab specimens and should not be used as a sole basis for treatment. Nasal washings and aspirates are unacceptable for Xpert Xpress SARS-CoV-2/FLU/RSV testing.  Fact Sheet for Patients: EntrepreneurPulse.com.au  Fact Sheet for Healthcare Providers: IncredibleEmployment.be  This test is not yet approved or cleared by the [place] FDA and has been authorized for detection and/or diagnosis of SARS-CoV-2 by FDA under an Emergency Use Authorization (EUA). This EUA will remain in effect (meaning this test can be used) for the duration of the COVID-19 declaration under Section 564(b)(1) of the Act, 21 U.S.C. section 360bbb-3(b)(1), unless the  authorization is terminated or revoked.  Performed at [facility], [address] [place], [place] [address]     Coagulation Studies: No results for input(s): LABPROT, INR in the last 72 hours.  Urinalysis: No results for input(s): COLORURINE, LABSPEC, PHURINE, GLUCOSEU, HGBUR, BILIRUBINUR, KETONESUR, PROTEINUR, UROBILINOGEN, NITRITE, LEUKOCYTESUR in the last 72 hours.  Invalid input(s): APPERANCEUR    Imaging: CT ABDOMEN PELVIS WO CONTRAST  Result Date: 08/20/2021 CLINICAL DATA:  Abdominal pain and distention. End-stage renal disease on dialysis. EXAM: CT ABDOMEN AND PELVIS WITHOUT CONTRAST TECHNIQUE: Multidetector CT imaging of the abdomen and pelvis was performed following the standard protocol without IV contrast. COMPARISON:  05/10/2021 and 05/03/2021 FINDINGS: Lower chest: Multiple ill-defined nodular opacities are seen in the superior left lower lobe and lingula, which are new since previous studies several months ago, and most likely inflammatory or infectious in etiology. A moderate pericardial effusion shows no significant change. Hepatobiliary: No mass visualized on this unenhanced exam. Gallbladder is unremarkable no evidence of biliary ductal dilatation. Pancreas: No mass or inflammatory process visualized on this unenhanced exam. Spleen:  Within normal limits in size. Adrenals/Urinary tract: Severe diffuse bilateral renal parenchymal atrophy is consistent with chronic end-stage renal disease. No evidence of hydronephrosis. Urinary bladder is nearly completely empty. Stomach/Bowel: Unremarkable. Vascular/Lymphatic: No pathologically enlarged lymph nodes identified. No evidence of abdominal aortic aneurysm. Aortic atherosclerotic calcification noted. Mild dilatation of the right common iliac artery is again seen measuring 1.9 cm.  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 Reproductive:  Stable mild to moderately  enlarged prostate. Other: Large amount of ascites and diffuse mesenteric and body wall edema show no significant change. Musculoskeletal: No suspicious bone lesions identified. Diffuse osteosclerosis, consistent with renal osteodystrophy. IMPRESSION: No significant change in large amount of ascites, and diffuse mesenteric and body wall edema. Stable moderate pericardial effusion. New ill-defined nodular opacities in superior left lower lobe and lingula, most likely inflammatory or infectious in etiology. Continued follow-up by chest CT is recommended in 2-3 months. 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 Electronically Signed   By: Marlaine Hind M.D.   On: 08/20/2021 20:20   DG Chest Portable 1 View  Result Date: 08/20/2021 CLINICAL DATA:  Chest pain EXAM: PORTABLE CHEST 1 VIEW COMPARISON:  08/10/2021 FINDINGS: Moderate cardiomegaly. Unchanged position of right chest wall dual lumen catheter. Mild interstitial pulmonary edema is unchanged. No focal consolidation. No pleural effusion or pneumothorax. IMPRESSION: Cardiomegaly and mild interstitial pulmonary edema, unchanged. Electronically Signed   By: Ulyses Jarred M.D.   On: 08/20/2021 19:45     Medications:    sodium chloride      calcium acetate  1,334 mg Oral TID WC   Chlorhexidine Gluconate Cloth  6 each Topical Q0600   cinacalcet  30 mg Oral Q breakfast   furosemide  40 mg Intravenous BID   heparin sodium (porcine)       metoprolol tartrate  50 mg Oral BID   pantoprazole  20 mg Oral BID AC   patiromer  16.8 g Oral Daily   sodium chloride flush  3 mL Intravenous Q12H   sodium chloride, acetaminophen, hydrOXYzine, ondansetron (ZOFRAN) IV, oxyCODONE-acetaminophen, sodium chloride flush  Assessment/ Plan:  Mr. NAZIER NEYHART is a 55 y.o.  male with past medical history including COPD, chronic combined CHF, and end-stage renal disease with known noncompliance with dialysis regimen.  Hyperkalemia due to missed  dialysis treatments.  Potassium on arrival 6.2.  Last dialysis received 1 to 2 weeks ago.  Will provide urgent dialysis.  2.  End-stage renal disease on dialysis.  Dialysis performed today, tolerated well.  May need additional treatment tomorrow.  3. Anemia of chronic kidney disease Lab Results  Component Value Date   HGB 7.6 (L) 08/20/2021  Hemoglobin not at target, will order EPO 10,000 units with dialysis  4. Secondary Hyperparathyroidism:  Lab Results  Component Value Date   PTH 669 (H) 02/20/2020   CALCIUM 9.6 08/20/2021   CAION 1.12 (L) 06/02/2021   PHOS 8.2 (H) 08/20/2021    Calcium within range, but phosphorus elevated. Continue calcium acetate with meals  5.  Pulmonary edema seen on chest x-ray.  Likely due to fluid overload from missed dialysis treatments.  We will manage fluid volume during dialysis.   LOS: 0   11/30/20222:44 PM

## 2021-08-21 NOTE — ED Notes (Signed)
Pt yelling at this RN while trying to administer medications. RN informed pt inappropriate behavior. Call bell in reach. Stretcher locked in lowest position. Will attempt medication administration at another time.

## 2021-08-22 DIAGNOSIS — E875 Hyperkalemia: Secondary | ICD-10-CM

## 2021-08-22 DIAGNOSIS — I5043 Acute on chronic combined systolic (congestive) and diastolic (congestive) heart failure: Secondary | ICD-10-CM | POA: Diagnosis not present

## 2021-08-22 DIAGNOSIS — R109 Unspecified abdominal pain: Secondary | ICD-10-CM | POA: Diagnosis not present

## 2021-08-22 DIAGNOSIS — R1084 Generalized abdominal pain: Secondary | ICD-10-CM | POA: Diagnosis not present

## 2021-08-22 DIAGNOSIS — N186 End stage renal disease: Secondary | ICD-10-CM | POA: Diagnosis not present

## 2021-08-22 LAB — CBC WITH DIFFERENTIAL/PLATELET
Abs Immature Granulocytes: 0.16 10*3/uL — ABNORMAL HIGH (ref 0.00–0.07)
Basophils Absolute: 0.1 10*3/uL (ref 0.0–0.1)
Basophils Relative: 1 %
Eosinophils Absolute: 0.1 10*3/uL (ref 0.0–0.5)
Eosinophils Relative: 1 %
HCT: 24.5 % — ABNORMAL LOW (ref 39.0–52.0)
Hemoglobin: 7.7 g/dL — ABNORMAL LOW (ref 13.0–17.0)
Immature Granulocytes: 2 %
Lymphocytes Relative: 13 %
Lymphs Abs: 1.1 10*3/uL (ref 0.7–4.0)
MCH: 31.8 pg (ref 26.0–34.0)
MCHC: 31.4 g/dL (ref 30.0–36.0)
MCV: 101.2 fL — ABNORMAL HIGH (ref 80.0–100.0)
Monocytes Absolute: 1 10*3/uL (ref 0.1–1.0)
Monocytes Relative: 12 %
Neutro Abs: 6 10*3/uL (ref 1.7–7.7)
Neutrophils Relative %: 71 %
Platelets: 137 10*3/uL — ABNORMAL LOW (ref 150–400)
RBC: 2.42 MIL/uL — ABNORMAL LOW (ref 4.22–5.81)
RDW: 17.1 % — ABNORMAL HIGH (ref 11.5–15.5)
WBC: 8.4 10*3/uL (ref 4.0–10.5)
nRBC: 0 % (ref 0.0–0.2)

## 2021-08-22 LAB — BASIC METABOLIC PANEL
Anion gap: 18 — ABNORMAL HIGH (ref 5–15)
BUN: 68 mg/dL — ABNORMAL HIGH (ref 6–20)
CO2: 22 mmol/L (ref 22–32)
Calcium: 9.1 mg/dL (ref 8.9–10.3)
Chloride: 97 mmol/L — ABNORMAL LOW (ref 98–111)
Creatinine, Ser: 9.3 mg/dL — ABNORMAL HIGH (ref 0.61–1.24)
GFR, Estimated: 6 mL/min — ABNORMAL LOW (ref >60)
Glucose, Bld: 53 mg/dL — ABNORMAL LOW (ref 70–99)
Potassium: 5.6 mmol/L — ABNORMAL HIGH (ref 3.5–5.1)
Sodium: 137 mmol/L (ref 135–145)

## 2021-08-22 LAB — PATHOLOGIST SMEAR REVIEW

## 2021-08-22 LAB — GLUCOSE, CAPILLARY: Glucose-Capillary: 51 mg/dL — ABNORMAL LOW (ref 70–99)

## 2021-08-22 LAB — PHOSPHORUS: Phosphorus: 7.7 mg/dL — ABNORMAL HIGH (ref 2.5–4.6)

## 2021-08-22 MED ORDER — PATIROMER SORBITEX CALCIUM 8.4 G PO PACK: 16.8000 g | PACK | Freq: Every day | ORAL | 0 refills | Status: DC

## 2021-08-22 MED ORDER — HEPARIN SODIUM (PORCINE) 1000 UNIT/ML IJ SOLN
INTRAMUSCULAR | Status: AC
  Filled 2021-08-22: qty 10

## 2021-08-22 NOTE — Discharge Summary (Signed)
Physician Discharge Summary  Patient ID: Jordan [name] MRN: [id number] DOB/AGE: 28-Apr-1966 55 y.o.  Admit date: 08/20/2021 Discharge date: 08/22/2021  Admission Diagnoses:  Discharge Diagnoses:  Principal Problem:   Abdominal pain Active Problems:   Hypertensive emergency   Elevated troponin   Anemia in ESRD (end-stage renal disease) (HCC)   Volume overload   Acute on chronic combined systolic and diastolic CHF (congestive heart failure) (HCC)   Non-compliance with renal dialysis ([place])   Hyperkalemia   Discharged Condition: fair  Hospital Course:   Jordan [name] is a 55 y.o. male with medical history significant for ESRD on HD MWF, history of noncompliance with dialysis, chronic combined CHF, anemia of CKD, COPD on PRN 2.5L, with 4 recent hospitalizations for Serratia bacteremia, AV graft infection/graft removal/wound infection on IV cefepime on dialysis days but with AMA discharges each admission,,who now presents with Abdominal pain associated with distention and generalized malaise.   End-stage renal disease. Hyperkalemia  Anemia of chronic kidney disease Patient has not been compliant with dialysis. Patient will be seen by nephrology, received Veltassa, insulin and D50. Patient was dialyzed yesterday, will be dialyzed again today. Discussed with nephrology, patient can be discharged home today after dialysis.  We will continue Veltassa daily and followed by nephrology in the future.  Abdominal pain. Large ascites. Patient abdominal pain seem to be improved since had a bowel movement last night.  No abdominal tenderness.  No evidence of peritonitis. Patient had a paracentesis with 4 L of fluid removed.  Study does not show evidence of peritonitis.  Acute on chronic diastolic and systolic congestive heart failure. Condition improved after dialysis.  Left AV fistula wound dehiscence/infection. Examined patient left arm, the wound does not appear to be infected.  I  will leave the decision for antibiotic use to the nephrologist who previously see him. Referred to outpatient wound care.   Paroxysmal atrial fibrillation. Continue metoprolol.  COPD. Chronic hypoxemic respiratory failure. Continue oxygen treatment as needed.    Consults: nephrology  Significant Diagnostic Studies:   Treatments:   Discharge Exam: Blood pressure (!) 86/55, pulse 66, temperature 98.1 F (36.7 C), temperature source Oral, resp. rate (!) 0, weight 65.7 kg, SpO2 94 %. General appearance: alert and cooperative Resp: clear to auscultation bilaterally Cardio: regular rate and rhythm, S1, S2 normal, no murmur, click, rub or gallop GI: soft, non-tender; bowel sounds normal; no masses,  no organomegaly Extremities: extremities normal, atraumatic, no cyanosis or edema Left arm wound at previous AV fistula site. No redness, no draining.  Disposition: Discharge disposition: 01-Home or Self Care       Discharge Instructions     Diet general   Complete by: As directed    Resume renal diet   Discharge wound care:   Complete by: As directed    Home care to follow   Increase activity slowly   Complete by: As directed       Allergies as of 08/22/2021       Reactions   Lisinopril Swelling        Medication List     TAKE these medications    albuterol 108 (90 Base) MCG/ACT inhaler Commonly known as: VENTOLIN HFA Inhale 2 puffs into the lungs every 4 (four) hours as needed for wheezing or shortness of breath.   calcium acetate 667 MG capsule Commonly known as: PHOSLO Take 2 capsules (1,334 mg total) by mouth 3 (three) times daily with meals.   cinacalcet 30 MG tablet Commonly known  as: SENSIPAR Take 1 tablet (30 mg total) by mouth daily with breakfast.   docusate sodium 100 MG capsule Commonly known as: COLACE Take 1 capsule (100 mg total) by mouth 2 (two) times daily.   epoetin alfa 4000 UNIT/ML injection Commonly known as: EPOGEN Inject 1 mL  (4,000 Units total) into the vein every Monday, Wednesday, and Friday with hemodialysis.   ferrous sulfate 325 (65 FE) MG tablet Take 1 tablet (325 mg total) by mouth 2 (two) times daily with a meal.   metoprolol tartrate 50 MG tablet Commonly known as: LOPRESSOR Take 1 tablet (50 mg total) by mouth 2 (two) times daily.   pantoprazole 20 MG tablet Commonly known as: Protonix Take 1 tablet (20 mg total) by mouth 2 (two) times daily before a meal.   patiromer 8.4 g packet Commonly known as: VELTASSA Take 2 packets (16.8 g total) by mouth daily. Start taking on: August 23, 2021               Discharge Care Instructions  (From admission, onward)           Start     Ordered   08/22/21 0000  Discharge wound care:       Comments: Home care to follow   08/22/21 1007             Signed: Donne Robillard 08/22/2021, 10:07 AM

## 2021-08-22 NOTE — Progress Notes (Signed)
[facility]  ROUNDING NOTE   Subjective:   Jordan [name] is a 55 year old male with past medical history including COPD, chronic combined CHF, and end-stage renal disease with known noncompliance with dialysis regimen.  Patient presents to the emergency department via EMS with complaints of abdominal pain, nausea, vomiting and generalized malaise.  Patient is currently admitted under observation status.  Patient is known to our office from multiple previous admissions.  His assigned outpatient dialysis clinic is [facility] on a Monday Wednesday Friday schedule.  His last dialysis treatment was during his last admission.    Patient seen and evaluated during dialysis   HEMODIALYSIS FLOWSHEET:  Blood Flow Rate (mL/min): 400 mL/min Arterial Pressure (mmHg): -140 mmHg Venous Pressure (mmHg): 120 mmHg Transmembrane Pressure (mmHg): 60 mmHg Ultrafiltration Rate (mL/min): 340 mL/min Dialysate Flow Rate (mL/min): 500 ml/min Conductivity: Machine : 13.7 Conductivity: Machine : 13.7  Resting quietly at this time   Objective:  Vital signs in last 24 hours:  Temp:  [97.5 F (36.4 C)-98.1 F (36.7 C)] 98.1 F (36.7 C) (12/01 0911) Pulse Rate:  [39-84] 39 (12/01 1200) Resp:  [0-24] 24 (12/01 1200) BP: (84-152)/(52-101) 94/62 (12/01 1145) SpO2:  [65 %-100 %] 100 % (12/01 1200) Weight:  [64 kg-65.7 kg] 65.7 kg (12/01 0917)  Weight change:  Filed Weights   08/21/21 1720 08/22/21 0500 08/22/21 0917  Weight: 64.1 kg 64 kg 65.7 kg    Intake/Output: I/O last 3 completed shifts: In: 200 [P.O.:150; IV Piggyback:50] Out: -497    Intake/Output this shift:  No intake/output data recorded.  Physical Exam: General: NAD, resting comfortably  Head: Normocephalic, atraumatic. Moist oral mucosal membranes  Eyes: Anicteric  Lungs:  Mild basilar crackles, normal effort, 4 L O2  Heart: Regular rate and rhythm  Abdomen:  Soft, tender, mild distention  Extremities: No  peripheral edema.  Neurologic: Nonfocal, moving all four extremities  Skin: Generalized abrasions  Access: Right IJ PermCath    Basic Metabolic Panel: Recent Labs  Lab 08/20/21 2006 08/21/21 1427 08/22/21 0605  NA 142 135 137  K 6.2* 4.3 5.6*  CL 103 98 97*  CO2 20* 24 22  GLUCOSE 74 42* 53*  BUN 104* 55* 68*  CREATININE 12.97* 7.68* 9.30*  CALCIUM 9.6 9.1 9.1  MG 2.8*  --   --   PHOS 8.2*  --   --      Liver Function Tests: Recent Labs  Lab 08/20/21 2006  AST 16  ALT 12  ALKPHOS 106  BILITOT 1.3*  PROT 8.2*  ALBUMIN 3.6    No results for input(s): LIPASE, AMYLASE in the last 168 hours. No results for input(s): AMMONIA in the last 168 hours.  CBC: Recent Labs  Lab 08/20/21 2006 08/22/21 0605  WBC 10.7* 8.4  NEUTROABS 8.9* 6.0  HGB 7.6* 7.7*  HCT 25.1* 24.5*  MCV 102.9* 101.2*  PLT 152 137*     Cardiac Enzymes: No results for input(s): CKTOTAL, CKMB, CKMBINDEX, TROPONINI in the last 168 hours.  BNP: Invalid input(s): POCBNP  CBG: Recent Labs  Lab 08/21/21 1651 08/21/21 1726 08/21/21 1818  GLUCAP 66* 51* 98    Microbiology: Results for orders placed or performed during the hospital encounter of 08/20/21  Resp Panel by RT-PCR (Flu A&B, Covid) Nasopharyngeal Swab     Status: None   Collection Time: 08/20/21 10:37 PM   Specimen: Nasopharyngeal Swab; Nasopharyngeal(NP) swabs in vial transport medium  Result Value Ref Range Status   SARS Coronavirus 2  by RT PCR NEGATIVE NEGATIVE Final    Comment: (NOTE) SARS-CoV-2 target nucleic acids are NOT DETECTED.  The SARS-CoV-2 RNA is generally detectable in upper respiratory specimens during the acute phase of infection. The lowest concentration of SARS-CoV-2 viral copies this assay can detect is 138 copies/mL. A negative result does not preclude SARS-Cov-2 infection and should not be used as the sole basis for treatment or other patient management decisions. A negative result may occur with   improper specimen collection/handling, submission of specimen other than nasopharyngeal swab, presence of viral mutation(s) within the areas targeted by this assay, and inadequate number of viral copies(<138 copies/mL). A negative result must be combined with clinical observations, patient history, and epidemiological information. The expected result is Negative.  Fact Sheet for Patients:  EntrepreneurPulse.com.au  Fact Sheet for Healthcare Providers:  IncredibleEmployment.be  This test is no t yet approved or cleared by the [place] FDA and  has been authorized for detection and/or diagnosis of SARS-CoV-2 by FDA under an Emergency Use Authorization (EUA). This EUA will remain  in effect (meaning this test can be used) for the duration of the COVID-19 declaration under Section 564(b)(1) of the Act, 21 U.S.C.section 360bbb-3(b)(1), unless the authorization is terminated  or revoked sooner.       Influenza A by PCR NEGATIVE NEGATIVE Final   Influenza B by PCR NEGATIVE NEGATIVE Final    Comment: (NOTE) The Xpert Xpress SARS-CoV-2/FLU/RSV plus assay is intended as an aid in the diagnosis of influenza from Nasopharyngeal swab specimens and should not be used as a sole basis for treatment. Nasal washings and aspirates are unacceptable for Xpert Xpress SARS-CoV-2/FLU/RSV testing.  Fact Sheet for Patients: EntrepreneurPulse.com.au  Fact Sheet for Healthcare Providers: IncredibleEmployment.be  This test is not yet approved or cleared by the [place] FDA and has been authorized for detection and/or diagnosis of SARS-CoV-2 by FDA under an Emergency Use Authorization (EUA). This EUA will remain in effect (meaning this test can be used) for the duration of the COVID-19 declaration under Section 564(b)(1) of the Act, 21 U.S.C. section 360bbb-3(b)(1), unless the authorization is terminated  or revoked.  Performed at [facility], [address] [place], [place] [address]   Body fluid culture w Gram Stain     Status: None (Preliminary result)   Collection Time: 08/21/21  4:15 PM   Specimen: PATH Cytology Peritoneal fluid  Result Value Ref Range Status   Specimen Description   Final    PERITONEAL Performed at [facility], [address], [place], [place] [address]    Special Requests   Final    NONE Performed at [facility], [address] [place], [place] [address]    Gram Stain   Final    NO SQUAMOUS EPITHELIAL CELLS SEEN FEW WBC SEEN NO ORGANISMS SEEN    Culture   Final    NO GROWTH < 12 HOURS Performed at [facility], [place] [address] [place], [place] [address]    Report Status PENDING  Incomplete    Coagulation Studies: No results for input(s): LABPROT, INR in the last 72 hours.  Urinalysis: No results for input(s): COLORURINE, LABSPEC, PHURINE, GLUCOSEU, HGBUR, BILIRUBINUR, KETONESUR, PROTEINUR, UROBILINOGEN, NITRITE, LEUKOCYTESUR in the last 72 hours.  Invalid input(s): APPERANCEUR    Imaging: CT ABDOMEN PELVIS WO CONTRAST  Result Date: 08/20/2021 CLINICAL DATA:  Abdominal pain and distention. End-stage renal disease on dialysis. EXAM: CT ABDOMEN AND PELVIS WITHOUT CONTRAST TECHNIQUE: Multidetector CT imaging of  the abdomen and pelvis was performed following the standard protocol without IV contrast. COMPARISON:  05/10/2021 and 05/03/2021 FINDINGS: Lower chest: Multiple ill-defined nodular opacities are seen in the superior left lower lobe and lingula, which are new since previous studies several months ago, and most likely inflammatory or infectious in etiology. A moderate pericardial effusion shows no significant change. Hepatobiliary: No mass visualized on this unenhanced exam. Gallbladder is unremarkable no evidence of biliary ductal dilatation. Pancreas: No mass or inflammatory process visualized on this  unenhanced exam. Spleen:  Within normal limits in size. Adrenals/Urinary tract: Severe diffuse bilateral renal parenchymal atrophy is consistent with chronic end-stage renal disease. No evidence of hydronephrosis. Urinary bladder is nearly completely empty. Stomach/Bowel: Unremarkable. Vascular/Lymphatic: No pathologically enlarged lymph nodes identified. No evidence of abdominal aortic aneurysm. Aortic atherosclerotic calcification noted. Mild dilatation of the right common iliac artery is again seen measuring 1.9 cm. 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 Reproductive:  Stable mild to moderately enlarged prostate. Other: Large amount of ascites and diffuse mesenteric and body wall edema show no significant change. Musculoskeletal: No suspicious bone lesions identified. Diffuse osteosclerosis, consistent with renal osteodystrophy. IMPRESSION: No significant change in large amount of ascites, and diffuse mesenteric and body wall edema. Stable moderate pericardial effusion. New ill-defined nodular opacities in superior left lower lobe and lingula, most likely inflammatory or infectious in etiology. Continued follow-up by chest CT is recommended in 2-3 months. 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 Electronically Signed   By: Marlaine Hind M.D.   On: 08/20/2021 20:20   US Paracentesis  Result Date: 08/22/2021 INDICATION: Renal failure on hemodialysis.  Abdominal distension and ascites. EXAM: ULTRASOUND GUIDED  PARACENTESIS MEDICATIONS: Lidocaine 1% subcutaneous COMPLICATIONS: None immediate. PROCEDURE: Informed written consent was obtained from the patient after a discussion of the risks, benefits and alternatives to treatment. A timeout was performed prior to the initiation of the procedure. Initial ultrasound scanning demonstrates a large amount of ascites within the right lower abdominal quadrant. The right lower abdomen was  prepped and draped in the usual sterile fashion. 1% lidocaine was used for local anesthesia. Following this, a 6 Fr Safe-T-Centesis catheter was introduced. An ultrasound image was saved for documentation purposes. The paracentesis was performed. The catheter was removed and a dressing was applied. The patient tolerated the procedure well without immediate post procedural complication. Patient received post-procedure intravenous albumin; see nursing notes for details. FINDINGS: A total of approximately 4 L of clear yellow fluid was removed. Samples were sent to the laboratory as requested by the clinical team. IMPRESSION: Successful ultrasound-guided paracentesis yielding 4 liters of peritoneal fluid. Electronically Signed   By: Lucrezia Europe M.D.   On: 08/22/2021 08:05   DG Chest Portable 1 View  Result Date: 08/20/2021 CLINICAL DATA:  Chest pain EXAM: PORTABLE CHEST 1 VIEW COMPARISON:  08/10/2021 FINDINGS: Moderate cardiomegaly. Unchanged position of right chest wall dual lumen catheter. Mild interstitial pulmonary edema is unchanged. No focal consolidation. No pleural effusion or pneumothorax. IMPRESSION: Cardiomegaly and mild interstitial pulmonary edema, unchanged. Electronically Signed   By: Ulyses Jarred M.D.   On: 08/20/2021 19:45     Medications:    sodium chloride      calcium acetate  1,334 mg Oral TID WC   Chlorhexidine Gluconate Cloth  6 each Topical Q0600   cinacalcet  30 mg Oral Q breakfast   furosemide  40 mg Intravenous BID   heparin sodium (porcine)  metoprolol tartrate  50 mg Oral BID   pantoprazole  20 mg Oral BID AC   patiromer  16.8 g Oral Daily   sodium chloride flush  3 mL Intravenous Q12H   sodium chloride, acetaminophen, hydrOXYzine, ondansetron (ZOFRAN) IV, oxyCODONE-acetaminophen, sodium chloride flush  Assessment/ Plan:  Jordan [name] is a 55 y.o.  male with past medical history including COPD, chronic combined CHF, and end-stage renal disease with known  noncompliance with dialysis regimen.  Hyperkalemia due to missed dialysis treatments.  Potassium elevated on arrival and received urgent dialysis yesterday. Potassium recovered to 4.3 after treatment but is elevated today to 5.6. Patient receiving additional treatment for management of this.   2.  End-stage renal disease on dialysis.  Currently receiving dialysis. UF goal reduced to 513ml due to soft BP. Next treatment scheduled tomorrow.  3. Anemia of chronic kidney disease Lab Results  Component Value Date   HGB 7.7 (L) 08/22/2021  Continue EPO 10,000 units with dialysis  4. Secondary Hyperparathyroidism:  Lab Results  Component Value Date   PTH 669 (H) 02/20/2020   CALCIUM 9.1 08/22/2021   CAION 1.12 (L) 06/02/2021   PHOS 8.2 (H) 08/20/2021  Will recheck phosphorus in am Continue calcium acetate with meals  5.  Pulmonary edema seen on chest x-ray.  Likely due to fluid overload from missed dialysis treatments.  Improved with dialysis    LOS: 0   12/1/202212:28 PM

## 2021-08-25 LAB — BODY FLUID CULTURE W GRAM STAIN
Culture: NO GROWTH
Gram Stain: NONE SEEN

## 2021-08-26 LAB — BLOOD GAS, VENOUS
Acid-base deficit: 1.9 mmol/L (ref 0.0–2.0)
Bicarbonate: 25 mmol/L (ref 20.0–28.0)
FIO2: 30
MECHVT: 450 mL
O2 Saturation: 80.1 %
PEEP: 8 cmH2O
Patient temperature: 37
RATE: 12 resp/min
pCO2, Ven: 52 mmHg (ref 44.0–60.0)
pH, Ven: 7.29 (ref 7.250–7.430)
pO2, Ven: 50 mmHg — ABNORMAL HIGH (ref 32.0–45.0)

## 2021-08-27 NOTE — Telephone Encounter (Signed)
Na

## 2021-08-29 ENCOUNTER — Other Ambulatory Visit: Payer: Self-pay | Admitting: Family Medicine

## 2021-08-29 DIAGNOSIS — E875 Hyperkalemia: Secondary | ICD-10-CM

## 2021-08-29 MED ORDER — LOKELMA 5 G PO PACK: PACK | ORAL | 0 refills | Status: DC

## 2021-08-29 NOTE — Progress Notes (Signed)
Patient's insurance does not cover [place].  Will replace with [facility], which is covered.  Low dose, to be titrated by Nephrology as an ouaptient.  To be taken on days he does not do dialysis.

## 2021-09-02 ENCOUNTER — Other Ambulatory Visit: Payer: Self-pay

## 2021-09-02 ENCOUNTER — Emergency Department: Payer: Medicare Other

## 2021-09-02 DIAGNOSIS — I132 Hypertensive heart and chronic kidney disease with heart failure and with stage 5 chronic kidney disease, or end stage renal disease: Secondary | ICD-10-CM | POA: Insufficient documentation

## 2021-09-02 DIAGNOSIS — E875 Hyperkalemia: Secondary | ICD-10-CM | POA: Diagnosis not present

## 2021-09-02 DIAGNOSIS — F17210 Nicotine dependence, cigarettes, uncomplicated: Secondary | ICD-10-CM | POA: Diagnosis not present

## 2021-09-02 DIAGNOSIS — R7989 Other specified abnormal findings of blood chemistry: Secondary | ICD-10-CM | POA: Diagnosis not present

## 2021-09-02 DIAGNOSIS — R2243 Localized swelling, mass and lump, lower limb, bilateral: Secondary | ICD-10-CM | POA: Insufficient documentation

## 2021-09-02 DIAGNOSIS — Z992 Dependence on renal dialysis: Secondary | ICD-10-CM | POA: Insufficient documentation

## 2021-09-02 DIAGNOSIS — J449 Chronic obstructive pulmonary disease, unspecified: Secondary | ICD-10-CM | POA: Diagnosis not present

## 2021-09-02 DIAGNOSIS — Z8616 Personal history of COVID-19: Secondary | ICD-10-CM | POA: Insufficient documentation

## 2021-09-02 DIAGNOSIS — Z79899 Other long term (current) drug therapy: Secondary | ICD-10-CM | POA: Insufficient documentation

## 2021-09-02 DIAGNOSIS — N186 End stage renal disease: Secondary | ICD-10-CM | POA: Diagnosis not present

## 2021-09-02 DIAGNOSIS — I5043 Acute on chronic combined systolic (congestive) and diastolic (congestive) heart failure: Secondary | ICD-10-CM | POA: Diagnosis not present

## 2021-09-02 DIAGNOSIS — R531 Weakness: Secondary | ICD-10-CM | POA: Diagnosis present

## 2021-09-02 LAB — TROPONIN I (HIGH SENSITIVITY): Troponin I (High Sensitivity): 89 ng/L — ABNORMAL HIGH (ref <18)

## 2021-09-02 LAB — BASIC METABOLIC PANEL
Anion gap: 20 — ABNORMAL HIGH (ref 5–15)
BUN: 96 mg/dL — ABNORMAL HIGH (ref 6–20)
CO2: 18 mmol/L — ABNORMAL LOW (ref 22–32)
Calcium: 8.9 mg/dL (ref 8.9–10.3)
Chloride: 106 mmol/L (ref 98–111)
Creatinine, Ser: 14.82 mg/dL — ABNORMAL HIGH (ref 0.61–1.24)
GFR, Estimated: 4 mL/min — ABNORMAL LOW (ref >60)
Glucose, Bld: 116 mg/dL — ABNORMAL HIGH (ref 70–99)
Potassium: 6.1 mmol/L — ABNORMAL HIGH (ref 3.5–5.1)
Sodium: 144 mmol/L (ref 135–145)

## 2021-09-02 LAB — CBC
HCT: 24 % — ABNORMAL LOW (ref 39.0–52.0)
Hemoglobin: 7.6 g/dL — ABNORMAL LOW (ref 13.0–17.0)
MCH: 32.9 pg (ref 26.0–34.0)
MCHC: 31.7 g/dL (ref 30.0–36.0)
MCV: 103.9 fL — ABNORMAL HIGH (ref 80.0–100.0)
Platelets: 145 10*3/uL — ABNORMAL LOW (ref 150–400)
RBC: 2.31 MIL/uL — ABNORMAL LOW (ref 4.22–5.81)
RDW: 18 % — ABNORMAL HIGH (ref 11.5–15.5)
WBC: 8 10*3/uL (ref 4.0–10.5)
nRBC: 0 % (ref 0.0–0.2)

## 2021-09-02 LAB — LACTIC ACID, PLASMA: Lactic Acid, Venous: 1.6 mmol/L (ref 0.5–1.9)

## 2021-09-02 IMAGING — CR DG CHEST 2V
2 series · 2 of 2 positions shown · non-contrast
Comparison: [DATE]

CLINICAL DATA: Shortness of breath

EXAM:
CHEST - 2 VIEW

[chest lat]
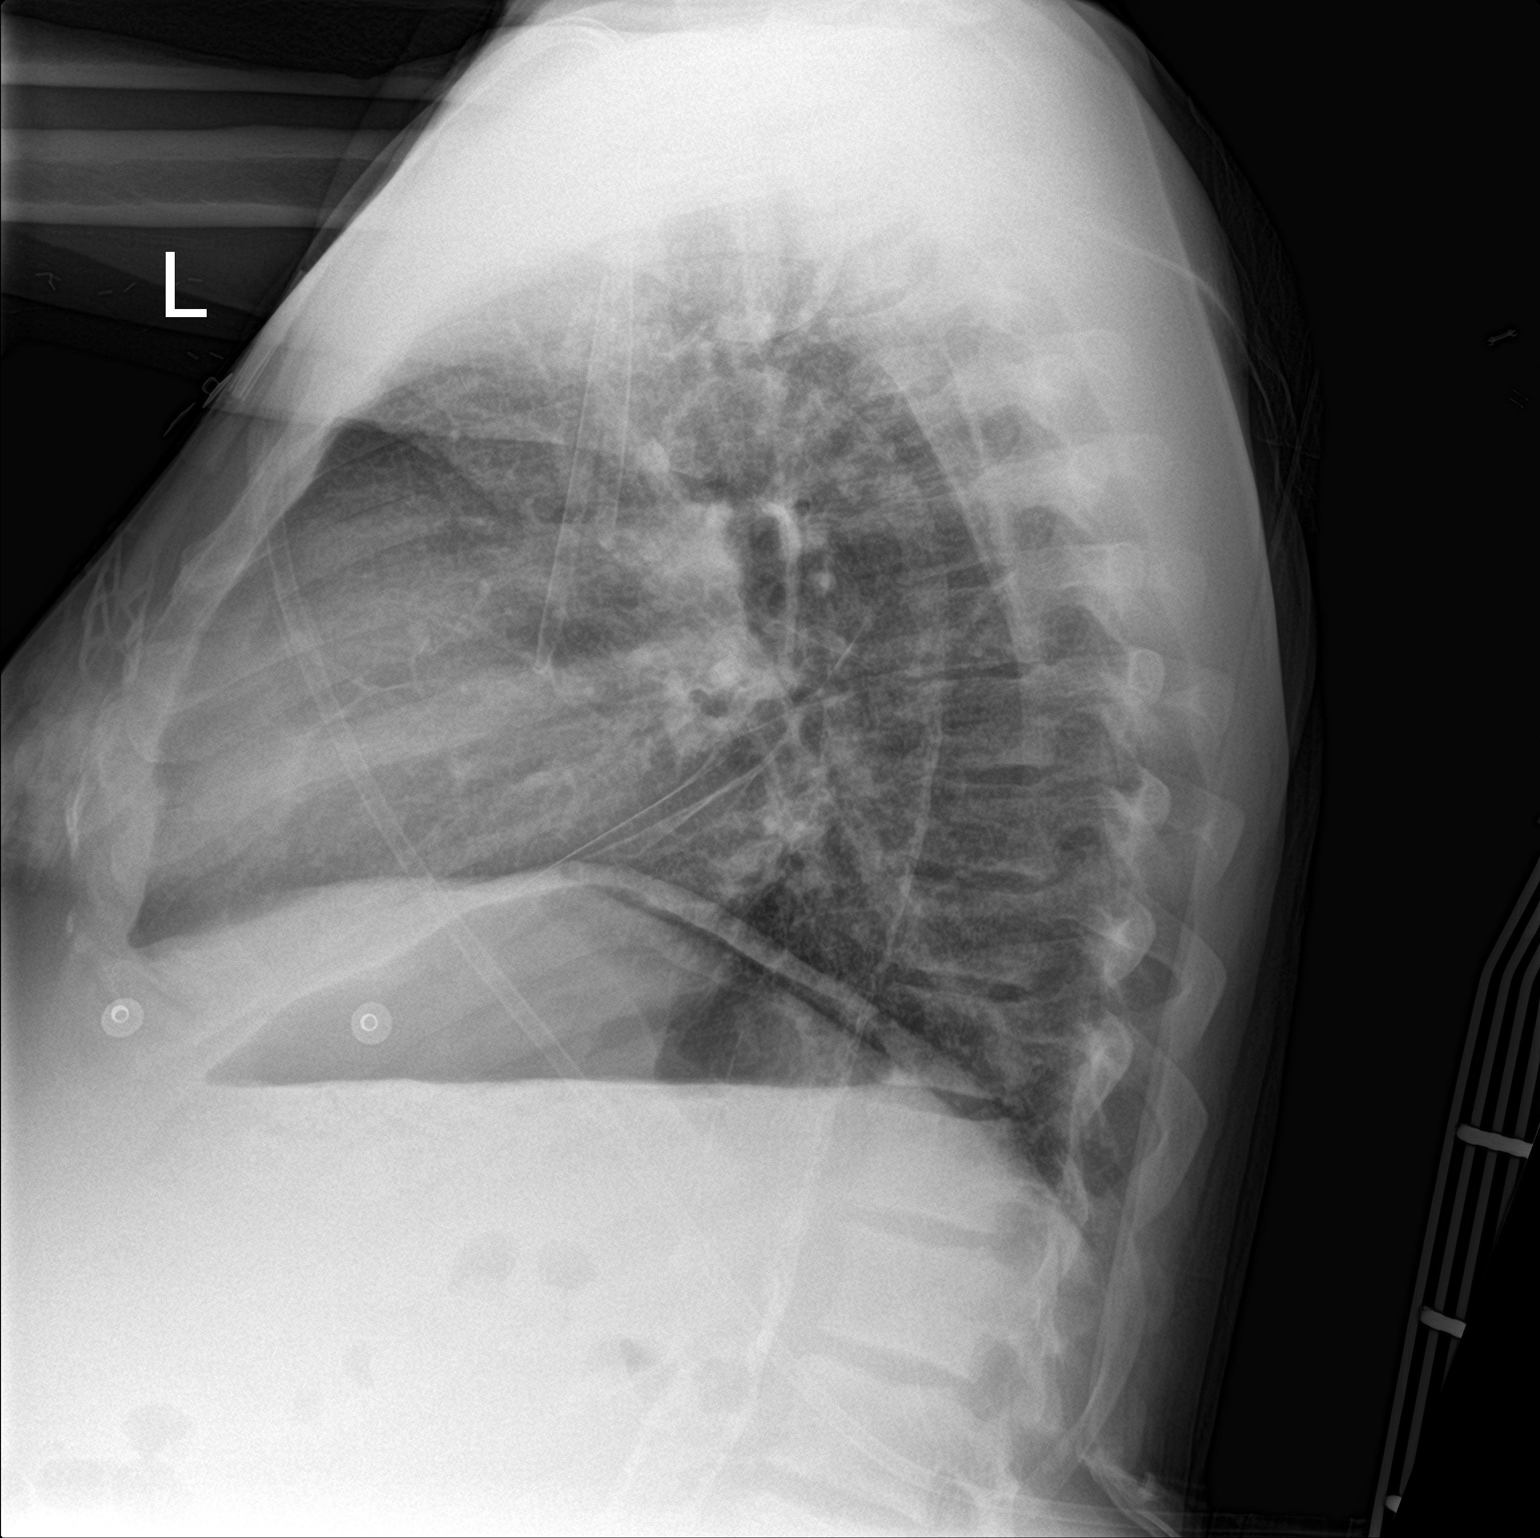

[chest ap]
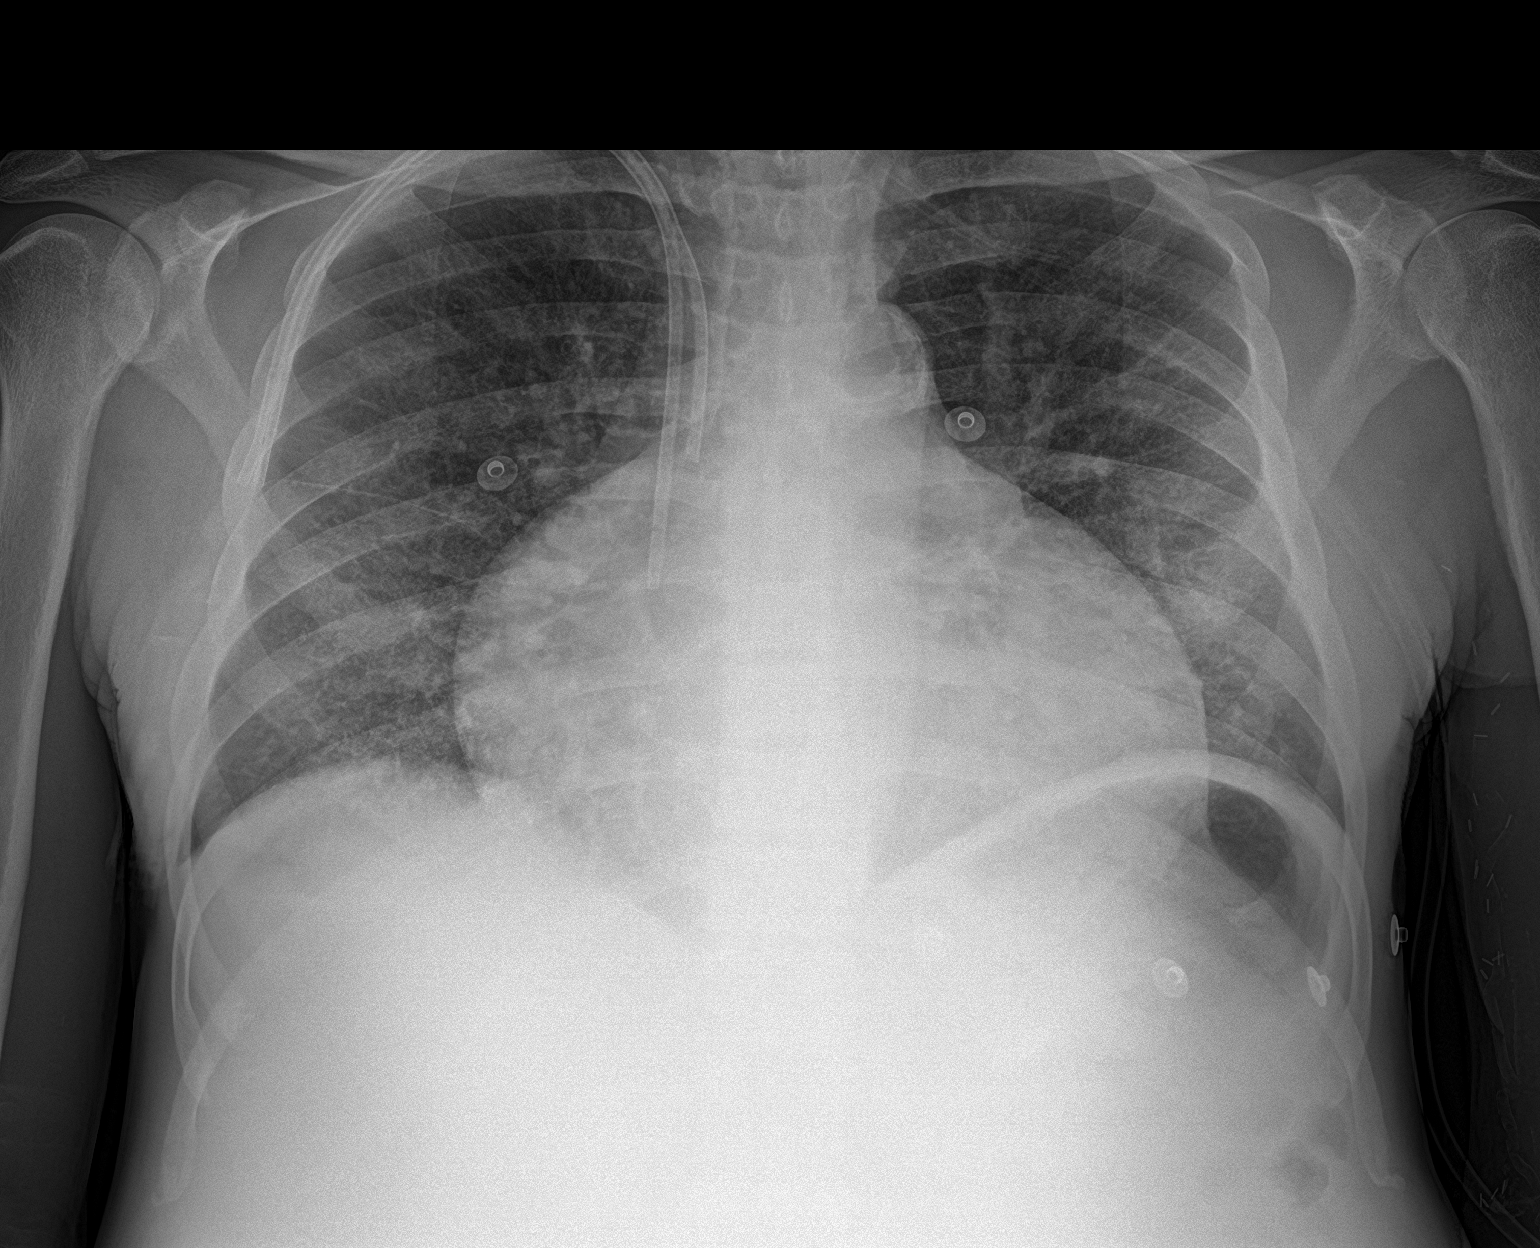

[2 of 2 positions shown; findings below may reference images not displayed]

FINDINGS: Right dialysis catheter remains in place, unchanged. Cardiomegaly.
No confluent opacities, effusions or edema. No acute bony
abnormality. Aortic atherosclerosis.
IMPRESSION: Cardiomegaly.  No active disease.

## 2021-09-02 NOTE — ED Provider Notes (Signed)
Emergency Medicine Provider Triage Evaluation Note  Jordan [name] , a 55 y.o. male  was evaluated in triage.  Pt complains of presents to the emergency department with shortness of breath and generalized weakness.  Patient was supposed to have his dialysis and missed his appointment today.  Patient is not on supplemental oxygen at home but was found to be hypoxic by EMS and is currently on 100% on 4 L.  He is endorsing some chest tightness.  Review of Systems  Positive: Patient has shortness of breath and chest tightness. Negative: No abdominal pain  Physical Exam  There were no vitals taken for this visit. Gen:   Awake Resp:  Patient has increased work of breathing. MSK:   Moves extremities without difficulty  Other:    Medical Decision Making  Medically screening exam initiated at 9:35 PM.  Appropriate orders placed.  [name] was informed that the remainder of the evaluation will be completed by another provider, this initial triage assessment does not replace that evaluation, and the importance of remaining in the ED until their evaluation is complete.     Vallarie Mare Gaffney, Hershal Coria 09/02/21 2136    Nena Polio, MD 09/02/21 2226

## 2021-09-02 NOTE — ED Triage Notes (Addendum)
Patient coming [facility] for generalized weakness, SOB. Patient missed dialysis today. Patient on 4L [place] to maintain oxgyen 100%, 90% on RA; patient not on oxygen at baseline. EMS vitals. BP 162/100, CBG 154, afebrile.   IV started in during transport.   Patient also c/o of left arm pain at fistula site. Area raised, leaking clear fluid, sutures in place, but incision not approximated.

## 2021-09-03 ENCOUNTER — Other Ambulatory Visit: Payer: Self-pay

## 2021-09-03 ENCOUNTER — Emergency Department
Admission: EM | Admit: 2021-09-03 | Discharge: 2021-09-03 | Disposition: A | Discharge status: Home / Self Care | Payer: Medicare Other | Attending: Emergency Medicine | Admitting: Emergency Medicine

## 2021-09-03 DIAGNOSIS — N186 End stage renal disease: Secondary | ICD-10-CM

## 2021-09-03 DIAGNOSIS — R079 Chest pain, unspecified: Secondary | ICD-10-CM

## 2021-09-03 DIAGNOSIS — E875 Hyperkalemia: Secondary | ICD-10-CM

## 2021-09-03 LAB — POTASSIUM: Potassium: 3.8 mmol/L (ref 3.5–5.1)

## 2021-09-03 LAB — TROPONIN I (HIGH SENSITIVITY): Troponin I (High Sensitivity): 84 ng/L — ABNORMAL HIGH (ref <18)

## 2021-09-03 MED ORDER — FAMOTIDINE IN NACL 20-0.9 MG/50ML-% IV SOLN
20.0000 mg | Freq: Once | INTRAVENOUS | Status: AC
  Administered 2021-09-03: 20 mg via INTRAVENOUS
  Filled 2021-09-03: qty 50

## 2021-09-03 MED ORDER — HEPARIN SODIUM (PORCINE) 1000 UNIT/ML IJ SOLN
INTRAMUSCULAR | Status: AC
  Filled 2021-09-03: qty 10

## 2021-09-03 MED ORDER — SODIUM CHLORIDE 0.9 % IV SOLN: 100.0000 mL | INTRAVENOUS | Status: DC | PRN

## 2021-09-03 MED ORDER — LIDOCAINE-PRILOCAINE 2.5-2.5 % EX CREA: 1.0000 "application " | TOPICAL_CREAM | CUTANEOUS | Status: DC | PRN

## 2021-09-03 MED ORDER — ALTEPLASE 2 MG IJ SOLR
2.0000 mg | Freq: Once | INTRAMUSCULAR | Status: DC | PRN
  Filled 2021-09-03: qty 2

## 2021-09-03 MED ORDER — MORPHINE SULFATE (PF) 2 MG/ML IV SOLN
2.0000 mg | Freq: Once | INTRAVENOUS | Status: DC
  Filled 2021-09-03: qty 1

## 2021-09-03 MED ORDER — SODIUM ZIRCONIUM CYCLOSILICATE 10 G PO PACK
10.0000 g | PACK | Freq: Once | ORAL | Status: AC
Start: 2021-09-03 — End: 2021-09-03
  Administered 2021-09-03: 10 g via ORAL
  Filled 2021-09-03: qty 1

## 2021-09-03 MED ORDER — DEXTROSE 50 % IV SOLN
25.0000 g | Freq: Once | INTRAVENOUS | Status: AC
  Administered 2021-09-03: 25 g via INTRAVENOUS
  Filled 2021-09-03: qty 50

## 2021-09-03 MED ORDER — INSULIN ASPART 100 UNIT/ML IV SOLN
10.0000 [IU] | Freq: Once | INTRAVENOUS | Status: AC
  Administered 2021-09-03: 10 [IU] via INTRAVENOUS
  Filled 2021-09-03: qty 0.1

## 2021-09-03 MED ORDER — HEPARIN SODIUM (PORCINE) 1000 UNIT/ML DIALYSIS
1000.0000 [IU] | INTRAMUSCULAR | Status: DC | PRN
  Filled 2021-09-03: qty 1

## 2021-09-03 MED ORDER — LIDOCAINE HCL (PF) 1 % IJ SOLN: 5.0000 mL | INTRAMUSCULAR | Status: DC | PRN

## 2021-09-03 MED ORDER — CHLORHEXIDINE GLUCONATE CLOTH 2 % EX PADS
6.0000 | MEDICATED_PAD | Freq: Every day | CUTANEOUS | Status: DC
  Filled 2021-09-03: qty 6

## 2021-09-03 MED ORDER — PENTAFLUOROPROP-TETRAFLUOROETH EX AERO
1.0000 "application " | INHALATION_SPRAY | CUTANEOUS | Status: DC | PRN
  Filled 2021-09-03: qty 30

## 2021-09-03 NOTE — ED Notes (Signed)
To dialysis via stretcher.

## 2021-09-03 NOTE — ED Notes (Signed)
Consent obtained for dialysis and report given to dialysis nurse.

## 2021-09-03 NOTE — ED Notes (Signed)
Transported to dialysis via transport tech

## 2021-09-03 NOTE — ED Notes (Signed)
MD at the bedside  

## 2021-09-03 NOTE — ED Provider Notes (Addendum)
----------------------------------------- °  12:36 PM on 09/03/2021 -----------------------------------------  I took over care of this patient from Dr. Starleen Blue.  I was called to the dialysis unit because the patient started to develop chest pain about 15 minutes ago.  He reports that it is sharp.  He is mildly tender in the chest.  His vital signs are stable.  He states he has not had pain like this before.  The patient already had an EKG and troponins that were at his baseline earlier.  I have a low suspicion for acute cardiac cause.  I have given verbal orders for EKG, analgesia, and Pepcid.  We will reevaluate when patient returns to the ED.   ----------------------------------------- 2:37 PM on 09/03/2021 -----------------------------------------  The patient was brought back to the ED since he was done with dialysis.  EKG is unchanged from prior except that he was in atrial fibrillation when it was taken.  He is now back in sinus rhythm.  The repeat troponin is at baseline.  The patient immediately fell asleep after returning to the ED and his chest pain resolved.  He has now eaten.  He states that his belly was uncomfortable after eating but it is not bothering him now and he wants to go home.  He does not demonstrate any respiratory distress.  His vital signs are stable.  He is appropriate for discharge home at this time.  I counseled him on the results of the work-up and plan of care.  Return precautions given, and he expresses understanding.  ED ECG REPORT I, Arta Silence, the attending physician, personally viewed and interpreted this ECG.  Date: 09/03/2021 EKG Time: 1259 Rate: 112 Rhythm: Atrial fibrillation QRS Axis: normal Intervals: Prolonged QTc ST/T Wave abnormalities: Nonspecific T wave abnormalities Narrative Interpretation: no evidence of acute ischemia; no significant change when compared to EKG of 2143 yesterday        Arta Silence, MD 09/03/21 1454

## 2021-09-03 NOTE — ED Notes (Signed)
Resting with eyes closed. No signs of distress. No complaints

## 2021-09-03 NOTE — Progress Notes (Signed)
[facility]  ROUNDING NOTE   Subjective:   Jordan [name] is a 55 year old male with multiple admissions for shortness of breath and missed dialysis treatments. He has presented to the ED with complaints of shortness of breath. He states his last dialysis treatment was on Friday. Denies nausea, vomiting and diarrhea. Continues to complain of shortness of breath but states it has improved since dialysis started.   Patient seen and evaluated on dialysis   HEMODIALYSIS FLOWSHEET:  Blood Flow Rate (mL/min): 400 mL/min Arterial Pressure (mmHg): -120 mmHg Venous Pressure (mmHg): 120 mmHg Transmembrane Pressure (mmHg): 70 mmHg Ultrafiltration Rate (mL/min): 0 mL/min Dialysate Flow Rate (mL/min): 500 ml/min Conductivity: Machine : 13.9 Conductivity: Machine : 13.9 Dialysate Change: 2K  Complaining of hunger   Objective:  Vital signs in last 24 hours:  Temp:  [97.7 F (36.5 C)-98.6 F (37 C)] 97.7 F (36.5 C) (12/13 0933) Pulse Rate:  [70-141] 81 (12/13 1115) Resp:  [11-24] 11 (12/13 1115) BP: (104-156)/(68-107) 104/68 (12/13 1115) SpO2:  [90 %-100 %] 95 % (12/13 1115) Weight:  [72.6 kg] 72.6 kg (12/12 2136)  Weight change:  Filed Weights   09/02/21 2136  Weight: 72.6 kg    Intake/Output: No intake/output data recorded.   Intake/Output this shift:  No intake/output data recorded.  Physical Exam: General: NAD, resting comfortable  Head: Normocephalic, atraumatic. Moist oral mucosal membranes  Eyes: Anicteric  Lungs:  Basilar crackles bilaterally, normal effort, O2 2L  Heart: Regular rate and rhythm  Abdomen:  Soft, nontender  Extremities: no peripheral edema.  Neurologic: Nonfocal, moving all four extremities  Skin: No lesions  Access: Rt Permcath    Basic Metabolic Panel: Recent Labs  Lab 09/02/21 2148  NA 144  K 6.1*  CL 106  CO2 18*  GLUCOSE 116*  BUN 96*  CREATININE 14.82*  CALCIUM 8.9    Liver Function Tests: No results for  input(s): AST, ALT, ALKPHOS, BILITOT, PROT, ALBUMIN in the last 168 hours. No results for input(s): LIPASE, AMYLASE in the last 168 hours. No results for input(s): AMMONIA in the last 168 hours.  CBC: Recent Labs  Lab 09/02/21 2148  WBC 8.0  HGB 7.6*  HCT 24.0*  MCV 103.9*  PLT 145*    Cardiac Enzymes: No results for input(s): CKTOTAL, CKMB, CKMBINDEX, TROPONINI in the last 168 hours.  BNP: Invalid input(s): POCBNP  CBG: No results for input(s): GLUCAP in the last 168 hours.  Microbiology: Results for orders placed or performed during the hospital encounter of 08/20/21  Resp Panel by RT-PCR (Flu A&B, Covid) Nasopharyngeal Swab     Status: None   Collection Time: 08/20/21 10:37 PM   Specimen: Nasopharyngeal Swab; Nasopharyngeal(NP) swabs in vial transport medium  Result Value Ref Range Status   SARS Coronavirus 2 by RT PCR NEGATIVE NEGATIVE Final    Comment: (NOTE) SARS-CoV-2 target nucleic acids are NOT DETECTED.  The SARS-CoV-2 RNA is generally detectable in upper respiratory specimens during the acute phase of infection. The lowest concentration of SARS-CoV-2 viral copies this assay can detect is 138 copies/mL. A negative result does not preclude SARS-Cov-2 infection and should not be used as the sole basis for treatment or other patient management decisions. A negative result may occur with  improper specimen collection/handling, submission of specimen other than nasopharyngeal swab, presence of viral mutation(s) within the areas targeted by this assay, and inadequate number of viral copies(<138 copies/mL). A negative result must be combined with clinical observations, patient history, and epidemiological information.  The expected result is Negative.  Fact Sheet for Patients:  EntrepreneurPulse.com.au  Fact Sheet for Healthcare Providers:  IncredibleEmployment.be  This test is no t yet approved or cleared by the [place]  FDA and  has been authorized for detection and/or diagnosis of SARS-CoV-2 by FDA under an Emergency Use Authorization (EUA). This EUA will remain  in effect (meaning this test can be used) for the duration of the COVID-19 declaration under Section 564(b)(1) of the Act, 21 U.S.C.section 360bbb-3(b)(1), unless the authorization is terminated  or revoked sooner.       Influenza A by PCR NEGATIVE NEGATIVE Final   Influenza B by PCR NEGATIVE NEGATIVE Final    Comment: (NOTE) The Xpert Xpress SARS-CoV-2/FLU/RSV plus assay is intended as an aid in the diagnosis of influenza from Nasopharyngeal swab specimens and should not be used as a sole basis for treatment. Nasal washings and aspirates are unacceptable for Xpert Xpress SARS-CoV-2/FLU/RSV testing.  Fact Sheet for Patients: EntrepreneurPulse.com.au  Fact Sheet for Healthcare Providers: IncredibleEmployment.be  This test is not yet approved or cleared by the [place] FDA and has been authorized for detection and/or diagnosis of SARS-CoV-2 by FDA under an Emergency Use Authorization (EUA). This EUA will remain in effect (meaning this test can be used) for the duration of the COVID-19 declaration under Section 564(b)(1) of the Act, 21 U.S.C. section 360bbb-3(b)(1), unless the authorization is terminated or revoked.  Performed at [facility], [address], [place], [place] [address]   Body fluid culture w Gram Stain     Status: None   Collection Time: 08/21/21  4:15 PM   Specimen: PATH Cytology Peritoneal fluid  Result Value Ref Range Status   Specimen Description   Final    PERITONEAL Performed at [facility], [address] [place], [place] [address]    Special Requests   Final    NONE Performed at [facility], [address] [place], [place] [address]    Gram Stain   Final    NO SQUAMOUS EPITHELIAL CELLS SEEN FEW WBC SEEN NO ORGANISMS SEEN     Culture   Final    NO GROWTH 3 DAYS Performed at [facility], [place] [address] [place], [place] [address]    Report Status 08/25/2021 FINAL  Final    Coagulation Studies: No results for input(s): LABPROT, INR in the last 72 hours.  Urinalysis: No results for input(s): COLORURINE, LABSPEC, PHURINE, GLUCOSEU, HGBUR, BILIRUBINUR, KETONESUR, PROTEINUR, UROBILINOGEN, NITRITE, LEUKOCYTESUR in the last 72 hours.  Invalid input(s): APPERANCEUR    Imaging: DG Chest 2 View  Result Date: 09/02/2021 CLINICAL DATA:  Shortness of breath EXAM: CHEST - 2 VIEW COMPARISON:  08/20/2021 FINDINGS: Right dialysis catheter remains in place, unchanged. Cardiomegaly. No confluent opacities, effusions or edema. No acute bony abnormality. Aortic atherosclerosis. IMPRESSION: Cardiomegaly.  No active disease. Electronically Signed   By: Rolm Baptise M.D.   On: 09/02/2021 22:06     Medications:     heparin sodium (porcine)       Chlorhexidine Gluconate Cloth  6 each Topical Q0600     Assessment/ Plan:  Jordan [name] is a 55 y.o.  male with multiple admissions for shortness of breath and missed dialysis treatments. He has presented to the ED with complaints of shortness of breath.   [facility] [address]/MWF/Rt Permcath  Shortness of breath with hyperkalemia due to missed dialysis treatment. Potassium 6.1. Last dialysis treatment received on Friday. Patient will  receive urgent dialysis today on 1K bath to reduce potassium. UF goal 2.5L as tolerated.  -Patient became tachycardic during treatment 130-150. Asymptomatic. UF reduced and NS bolus 278ml given. HR remained elevated. Turned UF off and lowered BFR to 350. Hr began to slowly decrease. Patient then complained of chest pain, mid-sternal with no radiation. Stat EKG ordered. No changes on telemetry. HR sustained at 130. ED MD evaluated patient at bedside and complete further workup in ED. Dialysis treatment terminated 1 hour early with 1.1L fluid  removal.   2. Anemia of chronic kidney disease Lab Results  Component Value Date   HGB 7.6 (L) 09/02/2021    Hgb below target. Will order EPO with future treatments  3. Secondary Hyperparathyroidism:  Lab Results  Component Value Date   PTH 669 (H) 02/20/2020   CALCIUM 8.9 09/02/2021   CAION 1.12 (L) 06/02/2021   PHOS 7.7 (H) 08/22/2021    Calcium at goal and phosphorus elevated.     LOS: 0   12/13/202212:22 PM

## 2021-09-03 NOTE — ED Provider Notes (Addendum)
[facility]  ____________________________________________   Event Date/Time   First MD Initiated Contact with Patient 09/03/21 0148     (approximate)  I have reviewed the triage vital signs and the nursing notes.   HISTORY  Chief Complaint Weakness    HPI Jordan [name] is a 55 y.o. male pmh ESRD on HD MWF, history of noncompliance with dialysis, chronic combined CHF, anemia of CKD, COPD on PRN 2.5L who presents with generalized weakness.  Initial triage complaint was for generalized weakness and missing dialysis.  Patient tells me that he needs dialysis and also complains of itching.  He denies chest pain shortness of breath.  Was last dialyzed on Friday.  Did not go to dialysis yesterday because he was feeling fatigued.  No fevers chills or abdominal pain.         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 08/07/2021   Wound infection after surgery 08/07/2021   CAP (community acquired pneumonia) 07/30/2021   ABLA (acute blood loss anemia) 07/17/2021   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 ([place]) 07/17/2021   AV fistula infection, 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/17/2021   AV fistula thrombosis, 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/17/2021   Pressure injury of skin 07/15/2021   Acute on chronic respiratory failure with hypoxemia ([place]) 07/12/2021   Ascites    Protein-calorie malnutrition, severe 06/05/2021   Hypervolemia associated with renal insufficiency 06/02/2021   Symptomatic anemia  05/28/2021   Acute on chronic diastolic CHF (congestive heart failure) ([place]) 05/28/2021   Hyperkalemia 05/28/2021   Pulmonary edema 04/29/2021   Pruritus 04/29/2021   COVID-19 virus infection 04/29/2021   Thrombocytopenia ([place]) 04/08/2021   COPD (chronic obstructive pulmonary disease) ([place]) 04/08/2021   CHF (congestive heart failure) ([place]) 03/25/2021   PAF (paroxysmal atrial fibrillation) ([place]) 03/20/2021   Chronic diastolic CHF (congestive heart failure) ([place]) 03/20/2021   Alcohol abuse 03/20/2021   Vision loss of left eye 03/20/2021   SOB (shortness of breath) 03/20/2021   Shortness of breath 03/10/2021   Abscess of buttock 03/02/2021   Fluid overload 03/02/2021   Multifocal pneumonia 01/13/2021   Abdominal pain 06/19/2020   Current mild episode of major depressive disorder without prior episode ([place]) 04/09/2020   Atrial fibrillation with RVR ([place]) 04/04/2020   Non-compliance with renal dialysis ([place]) 03/21/2020   Acute CHF (congestive heart failure) ([place]) 03/12/2020   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 ([place]) 02/03/2020   Acute on chronic combined systolic and diastolic CHF (congestive heart failure) ([place]) 02/03/2020   Acute on chronic respiratory failure with hypoxia ([place]) 11/12/2019   Aortic atherosclerosis ([place]) 11/12/2019   Emphysema of lung ([place]) 11/12/2019   Volume overload 10/05/2019   Elevated troponin 05/17/2018   Anemia in ESRD (end-stage renal disease) ([place]) 05/17/2018   ESRD on dialysis ([place]) 05/26/2017   Essential hypertension 05/26/2017   Moderate protein-calorie malnutrition ([place]) 08/13/2016   Secondary hyperparathyroidism of renal origin ([place]) 08/13/2016   Tobacco abuse 08/04/2016  Hypertensive emergency     Past Surgical History:  Procedure Laterality Date   A/V FISTULAGRAM Left 07/15/2021   Procedure: A/V FISTULAGRAM;  Surgeon: Algernon Huxley, MD;  Location: [place] [facility];  Service: Cardiovascular;  Laterality: Left;   BASCILIC  VEIN TRANSPOSITION Left 08/07/2016   Procedure: LEFT BASILIC VEIN TRANSPOSITION;  Surgeon: Angelia Mould, MD;  Location: [facility];  Service: Vascular;  Laterality: Left;   CLOSED REDUCTION NASAL FRACTURE N/A 08/11/2019   Procedure: CLOSED REDUCTION NASAL FRACTURE WITH STABILIZATION;  Surgeon: Irene Limbo, MD;  Location: [facility];  Service: Plastics;  Laterality: N/A;   COLONOSCOPY N/A 08/12/2016   Procedure: COLONOSCOPY;  Surgeon: Otis Brace, MD;  Location: [facility];  Service: Gastroenterology;  Laterality: N/A;   COLONOSCOPY WITH PROPOFOL N/A 06/05/2021   Procedure: COLONOSCOPY WITH PROPOFOL;  Surgeon: Sharyn Creamer, MD;  Location: [facility];  Service: Gastroenterology;  Laterality: N/A;   DIALYSIS/PERMA CATHETER INSERTION N/A 07/26/2021   Procedure: DIALYSIS/PERMA CATHETER INSERTION;  Surgeon: Algernon Huxley, MD;  Location: [facility];  Service: Cardiovascular;  Laterality: N/A;   ESOPHAGOGASTRODUODENOSCOPY N/A 08/12/2016   Procedure: ESOPHAGOGASTRODUODENOSCOPY (EGD);  Surgeon: Otis Brace, MD;  Location: [place];  Service: Gastroenterology;  Laterality: N/A;   ESOPHAGOGASTRODUODENOSCOPY (EGD) WITH PROPOFOL N/A 06/05/2021   Procedure: ESOPHAGOGASTRODUODENOSCOPY (EGD) WITH PROPOFOL;  Surgeon: Sharyn Creamer, MD;  Location: [facility];  Service: Gastroenterology;  Laterality: N/A;   INSERTION OF DIALYSIS CATHETER N/A 08/07/2016   Procedure: INSERTION OF TUNNELED DIALYSIS CATHETER;  Surgeon: Angelia Mould, MD;  Location: [place] [facility];  Service: Vascular;  Laterality: N/A;   LIGATION OF ARTERIOVENOUS  FISTULA Left 09/12/2016   Procedure: BANDING OF LEFT  ARTERIOVENOUS  FISTULA;  Surgeon: Angelia Mould, MD;  Location: [facility];  Service: Vascular;  Laterality: Left;   LIGATION OF ARTERIOVENOUS  FISTULA Left 07/17/2021   Procedure: LIGATION OF ARTERIOVENOUS  FISTULA;  Surgeon: Katha Cabal, MD;  Location: [facility];  Service: Vascular;  Laterality:  Left;  also include placement of temporary dialysis catheter right or left groin   LOWER EXTREMITY INTERVENTION Right 12/02/2018   Procedure: LOWER EXTREMITY INTERVENTION;  Surgeon: Algernon Huxley, MD;  Location: [facility];  Service: Cardiovascular;  Laterality: Right;   POLYPECTOMY  06/05/2021   Procedure: POLYPECTOMY;  Surgeon: Sharyn Creamer, MD;  Location: [facility];  Service: Gastroenterology;;   TEMPORARY DIALYSIS CATHETER Left 07/17/2021   Procedure: TEMPORARY DIALYSIS CATHETER;  Surgeon: Katha Cabal, MD;  Location: [facility];  Service: Vascular;  Laterality: Left;    Prior to Admission medications   Medication Sig Start Date End Date Taking? Authorizing Provider  albuterol (VENTOLIN HFA) 108 (90 Base) MCG/ACT inhaler Inhale 2 puffs into the lungs every 4 (four) hours as needed for wheezing or shortness of breath. 06/05/21   Danford, Suann Larry, MD  calcium acetate (PHOSLO) 667 MG capsule Take 2 capsules (1,334 mg total) by mouth 3 (three) times daily with meals. 06/05/21   Danford, Suann Larry, MD  cinacalcet (SENSIPAR) 30 MG tablet Take 1 tablet (30 mg total) by mouth daily with breakfast. 06/05/21   Danford, Suann Larry, MD  docusate sodium (COLACE) 100 MG capsule Take 1 capsule (100 mg total) by mouth 2 (two) times daily. 06/05/21   Edwin Dada, MD  epoetin alfa (EPOGEN) 4000 UNIT/ML injection Inject 1 mL (4,000 Units total) into the vein every Monday, Wednesday, and Friday with hemodialysis. 06/22/20   Enzo Bi, MD  ferrous sulfate 325 (65 FE) MG tablet Take 1 tablet (325 mg total) by mouth 2 (two) times daily with a meal. 06/05/21   Danford, Suann Larry, MD  metoprolol tartrate (LOPRESSOR) 50 MG tablet Take 1 tablet (50 mg total) by mouth 2 (two) times daily. 07/04/21   Jennye Boroughs, MD  pantoprazole (PROTONIX) 20 MG tablet Take 1 tablet (20 mg total) by mouth 2 (two) times daily before a meal. 06/05/21 08/07/21  Danford, Suann Larry, MD  sodium  zirconium cyclosilicate (LOKELMA) 5 g packet Take 5 g (one pack) once daily on days that you do not do dialysis.  Follow up with your kidney doctor. 08/29/21   Danford, Suann Larry, MD    Allergies Lisinopril  Family History  Problem Relation Age of Onset   Diabetes Mother    Kidney failure Mother    Healthy Father    Kidney failure Brother    Healthy Sister    Kidney disease Daughter    Post-traumatic stress disorder Neg Hx    Bladder Cancer Neg Hx    Kidney cancer Neg Hx     Social History Social History   Tobacco Use   Smoking status: Every Day    Packs/day: 0.50    Years: 40.00    Pack years: 20.00    Types: Cigarettes   Smokeless tobacco: Never  Vaping Use   Vaping Use: Never used  Substance Use Topics   Alcohol use: Not Currently    Alcohol/week: 21.0 standard drinks    Types: 21 Cans of beer per week    Comment: last drink 6 months ago   Drug use: No    Review of Systems   Review of Systems  Constitutional:  Negative for chills and fever.  Respiratory:  Negative for shortness of breath.   Cardiovascular:  Negative for chest pain.  Gastrointestinal:  Negative for abdominal pain, nausea and vomiting.  Skin:  Positive for rash.  All other systems reviewed and are negative.  Physical Exam Updated Vital Signs BP (!) 145/107   Pulse 70   Temp 98.6 F (37 C) (Oral)   Resp 17   Ht 5\' 4"  (1.626 m)   Wt 72.6 kg   SpO2 95%   BMI 27.46 kg/m   Physical Exam Vitals and nursing note reviewed.  Constitutional:      General: He is not in acute distress.    Appearance: Normal appearance.  HENT:     Head: Normocephalic and atraumatic.  Eyes:     General: No scleral icterus.    Conjunctiva/sclera: Conjunctivae normal.  Cardiovascular:     Rate and Rhythm: Normal rate and regular rhythm.  Pulmonary:     Effort: Pulmonary effort is normal. No respiratory distress.     Breath sounds: Normal breath sounds. No wheezing.  Musculoskeletal:        General: No  deformity or signs of injury.     Cervical back: Normal range of motion.  Skin:    Coloration: Skin is not jaundiced or pale.  Neurological:     General: No focal deficit present.     Mental Status: He is alert and oriented to person, place, and time. Mental status is at baseline.  Psychiatric:        Mood and Affect: Mood normal.        Behavior: Behavior normal.     LABS (all labs ordered are listed, but only abnormal results are displayed)  Labs Reviewed  BASIC METABOLIC PANEL -  Abnormal; Notable for the following components:      Result Value   Potassium 6.1 (*)    CO2 18 (*)    Glucose, Bld 116 (*)    BUN 96 (*)    Creatinine, Ser 14.82 (*)    GFR, Estimated 4 (*)    Anion gap 20 (*)    All other components within normal limits  CBC - Abnormal; Notable for the following components:   RBC 2.31 (*)    Hemoglobin 7.6 (*)    HCT 24.0 (*)    MCV 103.9 (*)    RDW 18.0 (*)    Platelets 145 (*)    All other components within normal limits  TROPONIN I (HIGH SENSITIVITY) - Abnormal; Notable for the following components:   Troponin I (High Sensitivity) 89 (*)    All other components within normal limits  LACTIC ACID, PLASMA   ____________________________________________  EKG  NSR, nml axis, nml intervals, no acute ischemic changes  ____________________________________________  RADIOLOGY I, Madelin Headings, personally viewed and evaluated these images (plain radiographs) as part of my medical decision making, as well as reviewing the written report by the radiologist.  ED MD interpretation: I reviewed the chest x-ray which shows chronic interstitial edema and cardiomegaly, no acute process    ____________________________________________   PROCEDURES  Procedure(s) performed (including Critical Care):  Procedures   ____________________________________________   INITIAL IMPRESSION / ASSESSMENT AND PLAN / ED COURSE   Patient is a 55 year old male with  history of end-stage renal disease on dialysis with frequent dialysis noncompliance who presents with generalized weakness.  He tells me that he needs dialysis and complains of itching.  Last dialyzed on Friday missed dialysis yesterday.  Vital signs notable for hypertension but otherwise within normal limits.  Patient is resting comfortably on my exam.  He does have some nodules on the lower extremities, question whether this is calciphylaxis.  He has no obvious rash on the back which is where he is itching currently.  Labs are notable for for a K of 6.1, no EKG changes.  BUN also elevated and he has an anion gap.  Lactate is normal.  Will discuss with nephrology as he will need to be dialyzed.  Discussed with Dr. Candiss Norse with nephrology who will add the patient on to dialysis this morning.  He can be discharged afterward.      ____________________________________________   FINAL CLINICAL IMPRESSION(S) / ED DIAGNOSES  Final diagnoses:  Hyperkalemia     ED Discharge Orders     None        Note:  This document was prepared using Dragon voice recognition software and may include unintentional dictation errors.    Rada Hay, MD 09/03/21 0440    Rada Hay, MD 09/03/21 [phone]

## 2021-09-03 NOTE — Discharge Instructions (Addendum)
All of your normal medications as prescribed.  Follow-up with your regular doctor and go to your next dialysis as scheduled.  Return to the ER for new, worsening, or persistent severe chest pain, difficulty breathing, abdominal pain, fever, weakness or numbness, or any other new or worsening symptoms that concern you.

## 2021-09-09 ENCOUNTER — Other Ambulatory Visit: Payer: Self-pay

## 2021-09-09 ENCOUNTER — Inpatient Hospital Stay
Admission: EM | Admit: 2021-09-09 | Discharge: 2021-09-10 | DRG: 640 | Disposition: A | Discharge status: Home / Self Care | Payer: Medicare Other | Attending: Internal Medicine | Admitting: Internal Medicine

## 2021-09-09 ENCOUNTER — Emergency Department: Payer: Medicare Other

## 2021-09-09 DIAGNOSIS — F17210 Nicotine dependence, cigarettes, uncomplicated: Secondary | ICD-10-CM | POA: Diagnosis present

## 2021-09-09 DIAGNOSIS — J9621 Acute and chronic respiratory failure with hypoxia: Secondary | ICD-10-CM | POA: Diagnosis present

## 2021-09-09 DIAGNOSIS — Z888 Allergy status to other drugs, medicaments and biological substances status: Secondary | ICD-10-CM | POA: Diagnosis not present

## 2021-09-09 DIAGNOSIS — Z79899 Other long term (current) drug therapy: Secondary | ICD-10-CM | POA: Diagnosis not present

## 2021-09-09 DIAGNOSIS — R188 Other ascites: Secondary | ICD-10-CM | POA: Diagnosis present

## 2021-09-09 DIAGNOSIS — Z9981 Dependence on supplemental oxygen: Secondary | ICD-10-CM

## 2021-09-09 DIAGNOSIS — I10 Essential (primary) hypertension: Secondary | ICD-10-CM

## 2021-09-09 DIAGNOSIS — E8770 Fluid overload, unspecified: Secondary | ICD-10-CM | POA: Diagnosis present

## 2021-09-09 DIAGNOSIS — Z841 Family history of disorders of kidney and ureter: Secondary | ICD-10-CM

## 2021-09-09 DIAGNOSIS — F1011 Alcohol abuse, in remission: Secondary | ICD-10-CM | POA: Diagnosis present

## 2021-09-09 DIAGNOSIS — Z720 Tobacco use: Secondary | ICD-10-CM | POA: Diagnosis not present

## 2021-09-09 DIAGNOSIS — E875 Hyperkalemia: Secondary | ICD-10-CM | POA: Diagnosis not present

## 2021-09-09 DIAGNOSIS — R0902 Hypoxemia: Secondary | ICD-10-CM

## 2021-09-09 DIAGNOSIS — I480 Paroxysmal atrial fibrillation: Secondary | ICD-10-CM | POA: Diagnosis not present

## 2021-09-09 DIAGNOSIS — Z992 Dependence on renal dialysis: Secondary | ICD-10-CM | POA: Diagnosis not present

## 2021-09-09 DIAGNOSIS — R778 Other specified abnormalities of plasma proteins: Secondary | ICD-10-CM

## 2021-09-09 DIAGNOSIS — Z5329 Procedure and treatment not carried out because of patient's decision for other reasons: Secondary | ICD-10-CM | POA: Diagnosis present

## 2021-09-09 DIAGNOSIS — Z20822 Contact with and (suspected) exposure to covid-19: Secondary | ICD-10-CM | POA: Diagnosis present

## 2021-09-09 DIAGNOSIS — I132 Hypertensive heart and chronic kidney disease with heart failure and with stage 5 chronic kidney disease, or end stage renal disease: Secondary | ICD-10-CM | POA: Diagnosis present

## 2021-09-09 DIAGNOSIS — I3139 Other pericardial effusion (noninflammatory): Secondary | ICD-10-CM | POA: Diagnosis present

## 2021-09-09 DIAGNOSIS — I5043 Acute on chronic combined systolic (congestive) and diastolic (congestive) heart failure: Secondary | ICD-10-CM | POA: Diagnosis present

## 2021-09-09 DIAGNOSIS — Z9115 Patient's noncompliance with renal dialysis: Secondary | ICD-10-CM | POA: Diagnosis not present

## 2021-09-09 DIAGNOSIS — D631 Anemia in chronic kidney disease: Secondary | ICD-10-CM | POA: Diagnosis not present

## 2021-09-09 DIAGNOSIS — Z833 Family history of diabetes mellitus: Secondary | ICD-10-CM | POA: Diagnosis not present

## 2021-09-09 DIAGNOSIS — N186 End stage renal disease: Secondary | ICD-10-CM | POA: Diagnosis present

## 2021-09-09 DIAGNOSIS — R0602 Shortness of breath: Secondary | ICD-10-CM

## 2021-09-09 DIAGNOSIS — N2581 Secondary hyperparathyroidism of renal origin: Secondary | ICD-10-CM | POA: Diagnosis present

## 2021-09-09 DIAGNOSIS — J449 Chronic obstructive pulmonary disease, unspecified: Secondary | ICD-10-CM | POA: Diagnosis present

## 2021-09-09 LAB — COMPREHENSIVE METABOLIC PANEL
ALT: 11 U/L (ref 0–44)
AST: 23 U/L (ref 15–41)
Albumin: 3.7 g/dL (ref 3.5–5.0)
Alkaline Phosphatase: 121 U/L (ref 38–126)
Anion gap: 19 — ABNORMAL HIGH (ref 5–15)
BUN: 119 mg/dL — ABNORMAL HIGH (ref 6–20)
CO2: 17 mmol/L — ABNORMAL LOW (ref 22–32)
Calcium: 9.3 mg/dL (ref 8.9–10.3)
Chloride: 104 mmol/L (ref 98–111)
Creatinine, Ser: 14.9 mg/dL — ABNORMAL HIGH (ref 0.61–1.24)
GFR, Estimated: 3 mL/min — ABNORMAL LOW (ref >60)
Glucose, Bld: 130 mg/dL — ABNORMAL HIGH (ref 70–99)
Potassium: 7 mmol/L (ref 3.5–5.1)
Sodium: 140 mmol/L (ref 135–145)
Total Bilirubin: 1.3 mg/dL — ABNORMAL HIGH (ref 0.3–1.2)
Total Protein: 8.9 g/dL — ABNORMAL HIGH (ref 6.5–8.1)

## 2021-09-09 LAB — TROPONIN I (HIGH SENSITIVITY)
Troponin I (High Sensitivity): 182 ng/L (ref <18)
Troponin I (High Sensitivity): 166 ng/L (ref <18)
Troponin I (High Sensitivity): 160 ng/L (ref <18)

## 2021-09-09 LAB — CBC
HCT: 24.3 % — ABNORMAL LOW (ref 39.0–52.0)
Hemoglobin: 7.5 g/dL — ABNORMAL LOW (ref 13.0–17.0)
MCH: 31.6 pg (ref 26.0–34.0)
MCHC: 30.9 g/dL (ref 30.0–36.0)
MCV: 102.5 fL — ABNORMAL HIGH (ref 80.0–100.0)
Platelets: 143 10*3/uL — ABNORMAL LOW (ref 150–400)
RBC: 2.37 MIL/uL — ABNORMAL LOW (ref 4.22–5.81)
RDW: 17.3 % — ABNORMAL HIGH (ref 11.5–15.5)
WBC: 9 10*3/uL (ref 4.0–10.5)
nRBC: 0 % (ref 0.0–0.2)

## 2021-09-09 LAB — LACTATE DEHYDROGENASE: LDH: 252 U/L — ABNORMAL HIGH (ref 98–192)

## 2021-09-09 LAB — BRAIN NATRIURETIC PEPTIDE: B Natriuretic Peptide: >4500 pg/mL — ABNORMAL HIGH (ref 0.0–100.0)

## 2021-09-09 LAB — POTASSIUM
Potassium: 5.7 mmol/L — ABNORMAL HIGH (ref 3.5–5.1)
Potassium: 4.5 mmol/L (ref 3.5–5.1)

## 2021-09-09 IMAGING — DX DG CHEST 1V PORT
1 series · 1 of 1 positions shown · non-contrast
Comparison: Chest x-ray [DATE]

CLINICAL DATA: Shortness of breath

EXAM:
PORTABLE CHEST 1 VIEW

[chest ap]
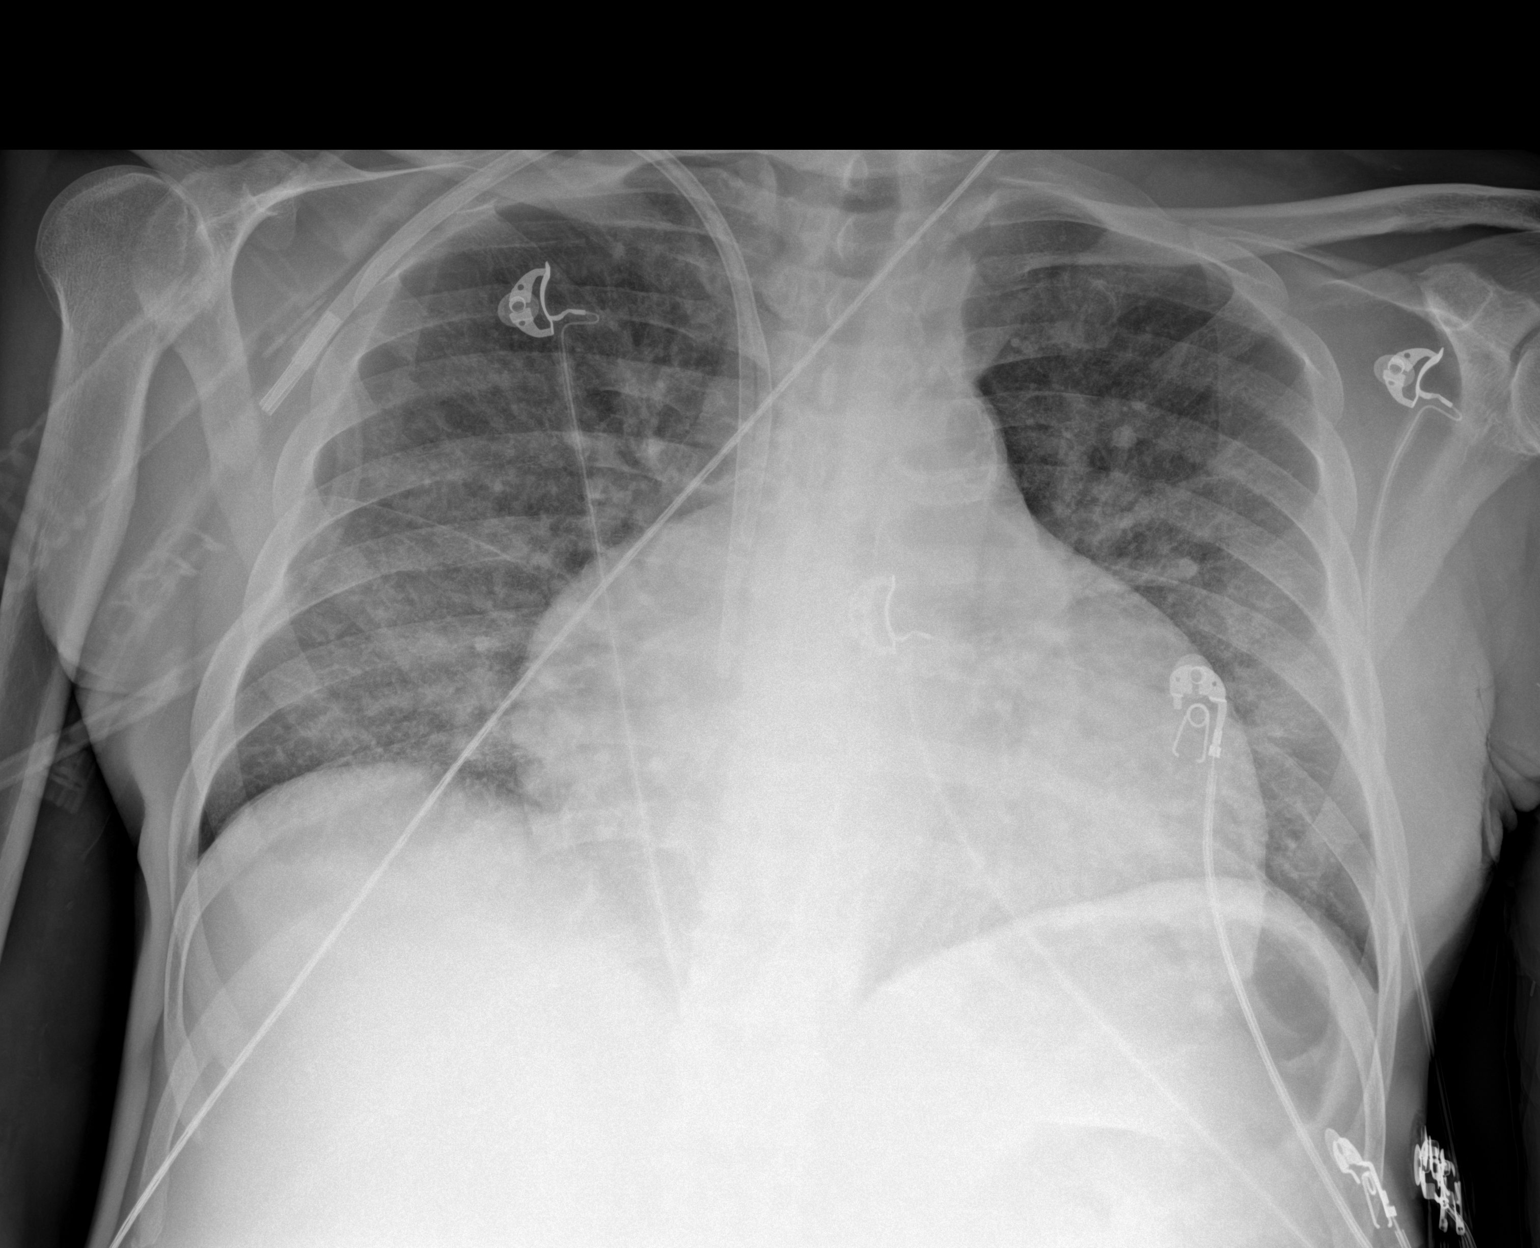

[1 of 1 positions shown; findings below may reference images not displayed]

FINDINGS: Cardiac silhouette is enlarged similar to previous study. Calcified
plaques in the aortic arch. Right-sided central line stable.
Pulmonary vascular congestion with no focal consolidation. No
pleural effusion or pneumothorax identified.
IMPRESSION: Grossly stable cardiomegaly and pericardial effusion as seen on
prior CT. Pulmonary vascular congestion.

## 2021-09-09 MED ORDER — LIDOCAINE HCL (PF) 1 % IJ SOLN: 5.0000 mL | INTRAMUSCULAR | Status: DC | PRN

## 2021-09-09 MED ORDER — INSULIN ASPART 100 UNIT/ML IV SOLN
5.0000 [IU] | Freq: Once | INTRAVENOUS | Status: AC
  Administered 2021-09-09: 17:00:00 5 [IU] via INTRAVENOUS
  Filled 2021-09-09: qty 0.05

## 2021-09-09 MED ORDER — LIDOCAINE-PRILOCAINE 2.5-2.5 % EX CREA: 1.0000 "application " | TOPICAL_CREAM | CUTANEOUS | Status: DC | PRN

## 2021-09-09 MED ORDER — SODIUM CHLORIDE 0.9 % IV SOLN: 100.0000 mL | INTRAVENOUS | Status: DC | PRN

## 2021-09-09 MED ORDER — ALTEPLASE 2 MG IJ SOLR
2.0000 mg | Freq: Once | INTRAMUSCULAR | Status: DC | PRN
  Filled 2021-09-09: qty 2

## 2021-09-09 MED ORDER — HYDRALAZINE HCL 20 MG/ML IJ SOLN: 5.0000 mg | INTRAMUSCULAR | Status: DC | PRN

## 2021-09-09 MED ORDER — CALCIUM GLUCONATE-NACL 1-0.675 GM/50ML-% IV SOLN
1.0000 g | Freq: Once | INTRAVENOUS | Status: AC
  Administered 2021-09-09: 17:00:00 1000 mg via INTRAVENOUS
  Filled 2021-09-09: qty 50

## 2021-09-09 MED ORDER — CALCIUM ACETATE (PHOS BINDER) 667 MG PO CAPS
1334.0000 mg | ORAL_CAPSULE | Freq: Three times a day (TID) | ORAL | Status: DC
  Filled 2021-09-09 (×3): qty 2

## 2021-09-09 MED ORDER — ACETAMINOPHEN 160 MG/5ML PO SOLN
650.0000 mg | Freq: Four times a day (QID) | ORAL | Status: DC | PRN
  Filled 2021-09-09: qty 20.3

## 2021-09-09 MED ORDER — CINACALCET HCL 30 MG PO TABS
30.0000 mg | ORAL_TABLET | Freq: Every day | ORAL | Status: DC
  Filled 2021-09-09: qty 1

## 2021-09-09 MED ORDER — ALBUTEROL SULFATE (2.5 MG/3ML) 0.083% IN NEBU: 2.5000 mg | INHALATION_SOLUTION | RESPIRATORY_TRACT | Status: DC | PRN

## 2021-09-09 MED ORDER — CHLORHEXIDINE GLUCONATE CLOTH 2 % EX PADS
6.0000 | MEDICATED_PAD | Freq: Every day | CUTANEOUS | Status: DC
  Filled 2021-09-09: qty 6

## 2021-09-09 MED ORDER — PATIROMER SORBITEX CALCIUM 8.4 G PO PACK
16.8000 g | PACK | Freq: Every day | ORAL | Status: DC
  Administered 2021-09-09: 17:00:00 16.8 g via ORAL
  Filled 2021-09-09 (×2): qty 2

## 2021-09-09 MED ORDER — HEPARIN SODIUM (PORCINE) 1000 UNIT/ML DIALYSIS
1000.0000 [IU] | INTRAMUSCULAR | Status: DC | PRN
  Filled 2021-09-09: qty 1

## 2021-09-09 MED ORDER — DM-GUAIFENESIN ER 30-600 MG PO TB12: 1.0000 | ORAL_TABLET | Freq: Two times a day (BID) | ORAL | Status: DC | PRN

## 2021-09-09 MED ORDER — HEPARIN SODIUM (PORCINE) 5000 UNIT/ML IJ SOLN
5000.0000 [IU] | Freq: Three times a day (TID) | INTRAMUSCULAR | Status: DC
  Filled 2021-09-09 (×2): qty 1

## 2021-09-09 MED ORDER — PENTAFLUOROPROP-TETRAFLUOROETH EX AERO
1.0000 "application " | INHALATION_SPRAY | CUTANEOUS | Status: DC | PRN
  Filled 2021-09-09: qty 30

## 2021-09-09 MED ORDER — DEXTROSE 50 % IV SOLN
50.0000 mL | Freq: Once | INTRAVENOUS | Status: AC
  Administered 2021-09-09: 17:00:00 50 mL via INTRAVENOUS
  Filled 2021-09-09: qty 50

## 2021-09-09 MED ORDER — ASPIRIN EC 81 MG PO TBEC
81.0000 mg | DELAYED_RELEASE_TABLET | Freq: Every day | ORAL | Status: DC
  Administered 2021-09-09: 19:00:00 81 mg via ORAL
  Filled 2021-09-09 (×2): qty 1

## 2021-09-09 MED ORDER — FERROUS SULFATE 325 (65 FE) MG PO TABS
325.0000 mg | ORAL_TABLET | Freq: Two times a day (BID) | ORAL | Status: DC
  Filled 2021-09-09: qty 1

## 2021-09-09 MED ORDER — NICOTINE 21 MG/24HR TD PT24
21.0000 mg | MEDICATED_PATCH | Freq: Every day | TRANSDERMAL | Status: DC
  Filled 2021-09-09 (×2): qty 1

## 2021-09-09 MED ORDER — METOPROLOL TARTRATE 50 MG PO TABS
50.0000 mg | ORAL_TABLET | Freq: Two times a day (BID) | ORAL | Status: DC
  Filled 2021-09-09 (×2): qty 1

## 2021-09-09 MED ORDER — LABETALOL HCL 5 MG/ML IV SOLN
10.0000 mg | Freq: Once | INTRAVENOUS | Status: AC
  Administered 2021-09-09: 14:00:00 10 mg via INTRAVENOUS
  Filled 2021-09-09: qty 4

## 2021-09-09 NOTE — H&P (Signed)
History and Physical    Jordan [name] OPF:[id number] DOB: 11-21-1965 DOA: 09/09/2021  Referring MD/NP/PA:   PCP: [facility], [facility], MD   Patient coming from:  The patient is coming from home.  At baseline, pt is independent for most of ADL.        Chief Complaint: SOB  HPI: Jordan [name] is a 55 y.o. male with medical history significant of hypertension, alcohol abuse in remission, tobacco abuse, COPD on prn 2L Oxygen at home, anemia, ESRD-HD (MWF), noncompliance to dialysis, who presents with shortness of breath.  Pt has missed hemodialysis for about 1 week.  He developed shortness of breath, which has been progressively worsening in the past several days.  Patient has dry cough, denies chest pain, fever or chills.  Patient was found to have severe respiratory distress with oxygen desaturated to 70%, BiPAP is started in the ED. Patient has severe abdominal distention, but denies abdominal pain, nausea, vomiting or diarrhea.  ED physician did bedside paracentesis with 4L of peritoneal fluid removed.  Patient feels better after paracentesis.  ED Course: pt was found to have potassium of 7.0, troponin level 182, BNP > 40 500, pending COVID-19 PCR, bicarbonate 17, creatinine 14.9, BUN 119.  Temperature normal, blood pressure 154/113, heart rate 102, 88, RR 20.  Chest x-ray showed cardiomegaly and vascular congestion and pericardial effusion.  Patient is admitted to progressive bed as inpatient.  Dr. Holley Raring of nephrology is consulted for urgent dialysis   Review of Systems:   General: no fevers, chills, no body weight gain, has poor appetite, has fatigue HEENT: no blurry vision, hearing changes or sore throat Respiratory: has dyspnea, coughing, no wheezing CV: no chest pain, no palpitations GI: no nausea, vomiting, abdominal pain, diarrhea, constipation. Has nominal distention GU: no dysuria, burning on urination, increased urinary frequency, hematuria  Ext: has mild leg  edema Neuro: no unilateral weakness, numbness, or tingling, no vision change or hearing loss Skin: no rash, no skin tear. MSK: No muscle spasm, no deformity, no limitation of range of movement in spin Heme: No easy bruising.  Travel history: No recent long distant travel.  Allergy:  Allergies  Allergen Reactions   Lisinopril Swelling    Past Medical History:  Diagnosis Date   Anemia    Aortic atherosclerosis ([place]) 11/12/2019   ED (erectile dysfunction)    Emphysema of lung ([place]) 11/12/2019   ESRD on hemodialysis ([place])    M/W/F in [place], [place]   ETOH abuse    History of blood transfusion    Hypertension    Wears dentures     Past Surgical History:  Procedure Laterality Date   A/V FISTULAGRAM Left 07/15/2021   Procedure: A/V FISTULAGRAM;  Surgeon: Algernon Huxley, MD;  Location: [facility];  Service: Cardiovascular;  Laterality: Left;   BASCILIC VEIN TRANSPOSITION Left 08/07/2016   Procedure: LEFT BASILIC VEIN TRANSPOSITION;  Surgeon: Angelia Mould, MD;  Location: [facility];  Service: Vascular;  Laterality: Left;   CLOSED REDUCTION NASAL FRACTURE N/A 08/11/2019   Procedure: CLOSED REDUCTION NASAL FRACTURE WITH STABILIZATION;  Surgeon: Irene Limbo, MD;  Location: [facility];  Service: Plastics;  Laterality: N/A;   COLONOSCOPY N/A 08/12/2016   Procedure: COLONOSCOPY;  Surgeon: Otis Brace, MD;  Location: [facility];  Service: Gastroenterology;  Laterality: N/A;   COLONOSCOPY WITH PROPOFOL N/A 06/05/2021   Procedure: COLONOSCOPY WITH PROPOFOL;  Surgeon: Sharyn Creamer, MD;  Location: [facility];  Service: Gastroenterology;  Laterality: N/A;  DIALYSIS/PERMA CATHETER INSERTION N/A 07/26/2021   Procedure: DIALYSIS/PERMA CATHETER INSERTION;  Surgeon: Algernon Huxley, MD;  Location: [facility];  Service: Cardiovascular;  Laterality: N/A;   ESOPHAGOGASTRODUODENOSCOPY N/A 08/12/2016   Procedure: ESOPHAGOGASTRODUODENOSCOPY (EGD);  Surgeon: Otis Brace,  MD;  Location: [facility];  Service: Gastroenterology;  Laterality: N/A;   ESOPHAGOGASTRODUODENOSCOPY (EGD) WITH PROPOFOL N/A 06/05/2021   Procedure: ESOPHAGOGASTRODUODENOSCOPY (EGD) WITH PROPOFOL;  Surgeon: Sharyn Creamer, MD;  Location: [facility];  Service: Gastroenterology;  Laterality: N/A;   INSERTION OF DIALYSIS CATHETER N/A 08/07/2016   Procedure: INSERTION OF TUNNELED DIALYSIS CATHETER;  Surgeon: Angelia Mould, MD;  Location: [facility];  Service: Vascular;  Laterality: N/A;   LIGATION OF ARTERIOVENOUS  FISTULA Left 09/12/2016   Procedure: BANDING OF LEFT  ARTERIOVENOUS  FISTULA;  Surgeon: Angelia Mould, MD;  Location: [facility];  Service: Vascular;  Laterality: Left;   LIGATION OF ARTERIOVENOUS  FISTULA Left 07/17/2021   Procedure: LIGATION OF ARTERIOVENOUS  FISTULA;  Surgeon: Katha Cabal, MD;  Location: [facility];  Service: Vascular;  Laterality: Left;  also include placement of temporary dialysis catheter right or left groin   LOWER EXTREMITY INTERVENTION Right 12/02/2018   Procedure: LOWER EXTREMITY INTERVENTION;  Surgeon: Algernon Huxley, MD;  Location: [facility];  Service: Cardiovascular;  Laterality: Right;   POLYPECTOMY  06/05/2021   Procedure: POLYPECTOMY;  Surgeon: Sharyn Creamer, MD;  Location: [facility];  Service: Gastroenterology;;   TEMPORARY DIALYSIS CATHETER Left 07/17/2021   Procedure: TEMPORARY DIALYSIS CATHETER;  Surgeon: Katha Cabal, MD;  Location: [facility];  Service: Vascular;  Laterality: Left;    Social History:  reports that he has been smoking cigarettes. He has a 20.00 pack-year smoking history. He has never used smokeless tobacco. He reports that he does not currently use alcohol after a past usage of about 21.0 standard drinks per week. He reports that he does not use drugs.  Family History:  Family History  Problem Relation Age of Onset   Diabetes Mother    Kidney failure Mother    Healthy Father    Kidney failure Brother     Healthy Sister    Kidney disease Daughter    Post-traumatic stress disorder Neg Hx    Bladder Cancer Neg Hx    Kidney cancer Neg Hx      Prior to Admission medications   Medication Sig Start Date End Date Taking? Authorizing Provider  albuterol (VENTOLIN HFA) 108 (90 Base) MCG/ACT inhaler Inhale 2 puffs into the lungs every 4 (four) hours as needed for wheezing or shortness of breath. 06/05/21   Danford, Suann Larry, MD  calcium acetate (PHOSLO) 667 MG capsule Take 2 capsules (1,334 mg total) by mouth 3 (three) times daily with meals. 06/05/21   Danford, Suann Larry, MD  cinacalcet (SENSIPAR) 30 MG tablet Take 1 tablet (30 mg total) by mouth daily with breakfast. 06/05/21   Danford, Suann Larry, MD  docusate sodium (COLACE) 100 MG capsule Take 1 capsule (100 mg total) by mouth 2 (two) times daily. 06/05/21   Edwin Dada, MD  epoetin alfa (EPOGEN) 4000 UNIT/ML injection Inject 1 mL (4,000 Units total) into the vein every Monday, Wednesday, and Friday with hemodialysis. 06/22/20   Enzo Bi, MD  ferrous sulfate 325 (65 FE) MG tablet Take 1 tablet (325 mg total) by mouth 2 (two) times daily with a meal. 06/05/21   Danford, Suann Larry, MD  metoprolol tartrate (LOPRESSOR) 50 MG tablet Take  1 tablet (50 mg total) by mouth 2 (two) times daily. 07/04/21   Jennye Boroughs, MD  pantoprazole (PROTONIX) 20 MG tablet Take 1 tablet (20 mg total) by mouth 2 (two) times daily before a meal. 06/05/21 08/07/21  Danford, Suann Larry, MD  sodium zirconium cyclosilicate (LOKELMA) 5 g packet Take 5 g (one pack) once daily on days that you do not do dialysis.  Follow up with your kidney doctor. 08/29/21   Edwin Dada, MD    Physical Exam: Vitals:   09/09/21 1540 09/09/21 1614 09/09/21 1620 09/09/21 1621  BP: (!) 167/115  (!) 140/111   Pulse: 88  100 100  Resp: 16  15 19   Temp:      TempSrc:      SpO2: 99% 100% (!) 89% 94%  Weight:      Height:       General: Not in acute  distress HEENT:       Eyes: PERRL, EOMI, no scleral icterus.       ENT: No discharge from the ears and nose, no pharynx injection, no tonsillar enlargement.        Neck: Has positive JVD, no bruit, no mass felt. Heme: No neck lymph node enlargement. Cardiac: S1/S2, RRR, No murmurs, No gallops or rubs. Respiratory: Has fine crackles bilaterally GI: Soft, distended, nontender, no rebound pain, no organomegaly, BS present. GU: No hematuria Ext: has trace leg edema bilaterally. 1+DP/PT pulse bilaterally. Musculoskeletal: No joint deformities, No joint redness or warmth, no limitation of ROM in spin. Skin: No rashes.  Neuro: Alert, oriented X3, cranial nerves II-XII grossly intact, moves all extremities normally. Psych: Patient is not psychotic, no suicidal or hemocidal ideation.  Labs on Admission: I have personally reviewed following labs and imaging studies  CBC: Recent Labs  Lab 09/02/21 2148 09/09/21 1412  WBC 8.0 9.0  HGB 7.6* 7.5*  HCT 24.0* 24.3*  MCV 103.9* 102.5*  PLT 145* 194*   Basic Metabolic Panel: Recent Labs  Lab 09/02/21 2148 09/03/21 1201 09/09/21 1412  NA 144  --  140  K 6.1* 3.8 7.0*  CL 106  --  104  CO2 18*  --  17*  GLUCOSE 116*  --  130*  BUN 96*  --  119*  CREATININE 14.82*  --  14.90*  CALCIUM 8.9  --  9.3   GFR: Estimated Creatinine Clearance: 5.2 mL/min (A) (by C-G formula based on SCr of 14.9 mg/dL (H)). Liver Function Tests: Recent Labs  Lab 09/09/21 1412  AST 23  ALT 11  ALKPHOS 121  BILITOT 1.3*  PROT 8.9*  ALBUMIN 3.7   No results for input(s): LIPASE, AMYLASE in the last 168 hours. No results for input(s): AMMONIA in the last 168 hours. Coagulation Profile: No results for input(s): INR, PROTIME in the last 168 hours. Cardiac Enzymes: No results for input(s): CKTOTAL, CKMB, CKMBINDEX, TROPONINI in the last 168 hours. BNP (last 3 results) No results for input(s): PROBNP in the last 8760 hours. HbA1C: No results for input(s):  HGBA1C in the last 72 hours. CBG: No results for input(s): GLUCAP in the last 168 hours. Lipid Profile: No results for input(s): CHOL, HDL, LDLCALC, TRIG, CHOLHDL, LDLDIRECT in the last 72 hours. Thyroid Function Tests: No results for input(s): TSH, T4TOTAL, FREET4, T3FREE, THYROIDAB in the last 72 hours. Anemia Panel: No results for input(s): VITAMINB12, FOLATE, FERRITIN, TIBC, IRON, RETICCTPCT in the last 72 hours. Urine analysis:    Component Value Date/Time   COLORURINE YELLOW (  A) 01/13/2021 0153   APPEARANCEUR CLEAR (A) 01/13/2021 0153   LABSPEC 1.012 01/13/2021 0153   PHURINE 9.0 (H) 01/13/2021 0153   GLUCOSEU 150 (A) 01/13/2021 0153   HGBUR NEGATIVE 01/13/2021 0153   BILIRUBINUR NEGATIVE 01/13/2021 0153   KETONESUR NEGATIVE 01/13/2021 0153   PROTEINUR >=300 (A) 01/13/2021 0153   NITRITE NEGATIVE 01/13/2021 0153   LEUKOCYTESUR NEGATIVE 01/13/2021 0153   Sepsis Labs: @LABRCNTIP (procalcitonin:4,lacticidven:4) )No results found for this or any previous visit (from the past 240 hour(s)).   Radiological Exams on Admission: DG Chest Portable 1 View  Result Date: 09/09/2021 CLINICAL DATA:  Shortness of breath EXAM: PORTABLE CHEST 1 VIEW COMPARISON:  Chest x-ray 09/02/2021 FINDINGS: Cardiac silhouette is enlarged similar to previous study. Calcified plaques in the aortic arch. Right-sided central line stable. Pulmonary vascular congestion with no focal consolidation. No pleural effusion or pneumothorax identified. IMPRESSION: Grossly stable cardiomegaly and pericardial effusion as seen on prior CT. Pulmonary vascular congestion. Electronically Signed   By: Ofilia Neas M.D.   On: 09/09/2021 14:24     EKG: I have personally reviewed.  Sinus rhythm, QTC 571, low voltage, early R wave progression, no T wave peaking.   Assessment/Plan Principal Problem:   Fluid overload Active Problems:   Tobacco abuse   Essential hypertension   Elevated troponin   Anemia in ESRD (end-stage  renal disease) (HCC)   Acute on chronic respiratory failure with hypoxia (HCC)   Acute on chronic combined systolic and diastolic CHF (congestive heart failure) (HCC)   PAF (paroxysmal atrial fibrillation) (HCC)   COPD (chronic obstructive pulmonary disease) (HCC)   Hyperkalemia   Ascites   Acute on chronic respiratory failure with hypoxia due to fluid overload and acute on chronic combined systolic and diastolic CHF: 2D echo on 45/36/4680 showed EF of 45-50% with grade 2 diastolic dysfunction.  Now patient has fluid overloaded due to dialysis noncompliance.  Dr. Holley Raring of renal is consulted for urgent dialysis  -Will admit to progressive unit as inpatient -Daily weights -strict I/O's -Low salt diet and renal diet -Fluid restriction -Obtain REDs Vest reading -prn albuterol and mucinex  Tobacco abuse -Nicotine patch  Essential hypertension -Continue metoprolol -IV hydralazine as needed  Elevated troponin: Troponin 182, no chest pain, likely due to demand ischemia -Trend troponin -Check A1c, FLP -Aspirin 81 mg daily  Anemia in ESRD (end-stage renal disease) ([place]): Hemoglobin stable 7.5 (7.6 on 09/02/2021) -Follow-up with CBC  PAF (paroxysmal atrial fibrillation) ([place]): Heart rate 102, 88.  Patient is not taking anticoagulants currently -Continue metoprolol  COPD (chronic obstructive pulmonary disease) ([place]): No wheezing or rhonchi on auscultation -Bronchodilators  Hyperkalemia: Potassium 7.0.  No T wave peaking on EKG -Patient was treated with 5 units of NovoLog, D50, 1 g of calcium gluconate -1 dose of Veltassa ordered -Patient will be dialyzed by renal  Ascites: ED physician did paracentesis, 4 L of fluid were removed.  Patient does not have abdominal pain. -Follow-up fluid analysis    DVT ppx: SQ Heparin Code Status: Full code Family Communication: not done, no family member is at bed side.     Disposition Plan:  Anticipate discharge back to previous  environment Consults called:  Dr. Holley Raring of nephrology Admission status and Level of care: Progressive:  as inpt       Status is: Inpatient  Remains inpatient appropriate because: Patient has multiple comorbidities including ESRD-HD.  Patient has noncompliance to dialysis.  He missed dialysis for about 1 week.  He developed acute on chronic  respiratory failure due to fluid overload.  Patient also has severe hyperkalemia with potassium of 7.0 and elevated troponin.  His presentation is highly complicated.  Patient is at higher risk of deteriorating.  Need to be treated in hospital for at least 2 days           Date of Service 09/09/2021    [facility]   If 7PM-7AM, please contact night-coverage [email or web address] 09/09/2021, 6:12 PM

## 2021-09-09 NOTE — Progress Notes (Signed)
Bipap on sb

## 2021-09-09 NOTE — ED Notes (Signed)
Dialysis at bedside at this time

## 2021-09-09 NOTE — Progress Notes (Signed)
[facility]  ROUNDING NOTE   Subjective:   Jordan [name] is a 55 year old male with multiple admissions for shortness of breath and missed dialysis treatments. He has presented to the ED with complaints of shortness of breath for a couple days. He will be admitted for Fluid overload [E87.70]  Patient is well-known to our practice from numerous previous admissions.  Patient receives outpatient dialysis treatments at [facility], though is noncompliant with the scheduled treatments.  He states his last dialysis treatment was during his last hospitalization, which was last Tuesday.. Denies nausea, vomiting and diarrhea. Denies fever and chills.  Patient currently seen lying on stretcher, currently on BiPAP.  Patient also with potassium 7.0.  Order Jordan [name] by ED physician.  Paracentesis performed during ED, 4 L amber-colored fluid removed.   Objective:  Vital signs in last 24 hours:  Temp:  [97.5 F (36.4 C)] 97.5 F (36.4 C) (12/19 1359) Pulse Rate:  [86-102] 100 (12/19 1621) Resp:  [11-20] 19 (12/19 1621) BP: (140-177)/(111-131) 140/111 (12/19 1620) SpO2:  [83 %-100 %] 94 % (12/19 1621) Weight:  [72.6 kg] 72.6 kg (12/19 1402)  Weight change:  Filed Weights   09/09/21 1402  Weight: 72.6 kg    Intake/Output: No intake/output data recorded.   Intake/Output this shift:  No intake/output data recorded.  Physical Exam: General: Ill-appearing  Head: Normocephalic, atraumatic. Moist oral mucosal membranes  Eyes: Anicteric  Lungs:  Unable to auscultate due to BiPAP  Heart: Regular rate and rhythm  Abdomen:  Soft, nontender  Extremities: no peripheral edema.  Angioedema  Neurologic: Nonfocal, moving all four extremities  Skin: No lesions  Access: Rt Permcath    Basic Metabolic Panel: Recent Labs  Lab 09/02/21 2148 09/03/21 1201 09/09/21 1412  NA 144  --  140  K 6.1* 3.8 7.0*  CL 106  --  104  CO2 18*  --  17*  GLUCOSE 116*  --  130*  BUN 96*   --  119*  CREATININE 14.82*  --  14.90*  CALCIUM 8.9  --  9.3     Liver Function Tests: Recent Labs  Lab 09/09/21 1412  AST 23  ALT 11  ALKPHOS 121  BILITOT 1.3*  PROT 8.9*  ALBUMIN 3.7   No results for input(s): LIPASE, AMYLASE in the last 168 hours. No results for input(s): AMMONIA in the last 168 hours.  CBC: Recent Labs  Lab 09/02/21 2148 09/09/21 1412  WBC 8.0 9.0  HGB 7.6* 7.5*  HCT 24.0* 24.3*  MCV 103.9* 102.5*  PLT 145* 143*     Cardiac Enzymes: No results for input(s): CKTOTAL, CKMB, CKMBINDEX, TROPONINI in the last 168 hours.  BNP: Invalid input(s): POCBNP  CBG: No results for input(s): GLUCAP in the last 168 hours.  Microbiology: Results for orders placed or performed during the hospital encounter of 08/20/21  Resp Panel by RT-PCR (Flu A&B, Covid) Nasopharyngeal Swab     Status: None   Collection Time: 08/20/21 10:37 PM   Specimen: Nasopharyngeal Swab; Nasopharyngeal(NP) swabs in vial transport medium  Result Value Ref Range Status   SARS Coronavirus 2 by RT PCR NEGATIVE NEGATIVE Final    Comment: (NOTE) SARS-CoV-2 target nucleic acids are NOT DETECTED.  The SARS-CoV-2 RNA is generally detectable in upper respiratory specimens during the acute phase of infection. The lowest concentration of SARS-CoV-2 viral copies this assay can detect is 138 copies/mL. A negative result does not preclude SARS-Cov-2 infection and should not be used as the sole  basis for treatment or other patient management decisions. A negative result may occur with  improper specimen collection/handling, submission of specimen other than nasopharyngeal swab, presence of viral mutation(s) within the areas targeted by this assay, and inadequate number of viral copies(<138 copies/mL). A negative result must be combined with clinical observations, patient history, and epidemiological information. The expected result is Negative.  Fact Sheet for Patients:   EntrepreneurPulse.com.au  Fact Sheet for Healthcare Providers:  IncredibleEmployment.be  This test is no t yet approved or cleared by the [place] FDA and  has been authorized for detection and/or diagnosis of SARS-CoV-2 by FDA under an Emergency Use Authorization (EUA). This EUA will remain  in effect (meaning this test can be used) for the duration of the COVID-19 declaration under Section 564(b)(1) of the Act, 21 U.S.C.section 360bbb-3(b)(1), unless the authorization is terminated  or revoked sooner.       Influenza A by PCR NEGATIVE NEGATIVE Final   Influenza B by PCR NEGATIVE NEGATIVE Final    Comment: (NOTE) The Xpert Xpress SARS-CoV-2/FLU/RSV plus assay is intended as an aid in the diagnosis of influenza from Nasopharyngeal swab specimens and should not be used as a sole basis for treatment. Nasal washings and aspirates are unacceptable for Xpert Xpress SARS-CoV-2/FLU/RSV testing.  Fact Sheet for Patients: EntrepreneurPulse.com.au  Fact Sheet for Healthcare Providers: IncredibleEmployment.be  This test is not yet approved or cleared by the [place] FDA and has been authorized for detection and/or diagnosis of SARS-CoV-2 by FDA under an Emergency Use Authorization (EUA). This EUA will remain in effect (meaning this test can be used) for the duration of the COVID-19 declaration under Section 564(b)(1) of the Act, 21 U.S.C. section 360bbb-3(b)(1), unless the authorization is terminated or revoked.  Performed at [facility], [address], [place], [place] [address]   Body fluid culture w Gram Stain     Status: None   Collection Time: 08/21/21  4:15 PM   Specimen: PATH Cytology Peritoneal fluid  Result Value Ref Range Status   Specimen Description   Final    PERITONEAL Performed at [facility], [address] [place], [place] [address]    Special Requests    Final    NONE Performed at [facility], [address] [place], [place] [address]    Gram Stain   Final    NO SQUAMOUS EPITHELIAL CELLS SEEN FEW WBC SEEN NO ORGANISMS SEEN    Culture   Final    NO GROWTH 3 DAYS Performed at [facility], [place] [address], [place], [place] [address]    Report Status 08/25/2021 FINAL  Final    Coagulation Studies: No results for input(s): LABPROT, INR in the last 72 hours.  Urinalysis: No results for input(s): COLORURINE, LABSPEC, PHURINE, GLUCOSEU, HGBUR, BILIRUBINUR, KETONESUR, PROTEINUR, UROBILINOGEN, NITRITE, LEUKOCYTESUR in the last 72 hours.  Invalid input(s): APPERANCEUR    Imaging: DG Chest Portable 1 View  Result Date: 09/09/2021 CLINICAL DATA:  Shortness of breath EXAM: PORTABLE CHEST 1 VIEW COMPARISON:  Chest x-ray 09/02/2021 FINDINGS: Cardiac silhouette is enlarged similar to previous study. Calcified plaques in the aortic arch. Right-sided central line stable. Pulmonary vascular congestion with no focal consolidation. No pleural effusion or pneumothorax identified. IMPRESSION: Grossly stable cardiomegaly and pericardial effusion as seen on prior CT. Pulmonary vascular congestion. Electronically Signed   By: Ofilia Neas M.D.   On: 09/09/2021 14:24     Medications:     [START ON 09/10/2021] Chlorhexidine Gluconate  Cloth  6 each Topical Q0600   heparin  5,000 Units Subcutaneous Q8H   nicotine  21 mg Transdermal Daily   patiromer  16.8 g Oral Daily     Assessment/ Plan:  [name] is a 55 y.o.  male with multiple admissions for shortness of breath and missed dialysis treatments. He has presented to the ED with complaints of shortness of breath.   [facility] [address]/MWF/Rt Permcath  Acute respiratory distress with volume overload due to missed dialysis treatment.  Paracentesis performed with 4 L amber-colored fluid removed.   2.  End-stage renal disease with hyperkalemia requiring dialysis.    Potassium 7.0.  Given Veltassa 16.8 g once. We will perform urgent dialysis on 1K bath with hourly potassium checks.  Orders to change to 2K bath once potassium less than 5.  May require dialysis tomorrow.  3. Anemia of chronic kidney disease Lab Results  Component Value Date   HGB 7.5 (L) 09/09/2021    Hemoglobin not within range.  Will order EPO with treatment  4. Secondary Hyperparathyroidism:  Lab Results  Component Value Date   PTH 669 (H) 02/20/2020   CALCIUM 9.3 09/09/2021   CAION 1.12 (L) 06/02/2021   PHOS 7.7 (H) 08/22/2021     Calcium at desired goal.  We will obtain updated phosphorus with a.m. labs.     LOS: 0   12/19/20225:46 PM

## 2021-09-09 NOTE — ED Provider Notes (Signed)
[facility] Emergency Department Provider Note  Time seen: 3:12 PM  I have reviewed the triage vital signs and the nursing notes.   HISTORY  Chief Complaint Shortness of Breath (Pt feeling SOB x 2 days. Missed dialysis today (went Friday). Hx CHF and HTN. Pt was 90% room air on medic arrival but felt like he could not get a good breath in. 100% on cpap on arrival. Abdominal distention noted. )   HPI Jordan [name] is a 55 y.o. male with a past medical history of anemia, ESRD on HD Monday/Wednesday/Friday, alcohol abuse, hypertension, presents to the emergency department for shortness of breath.  According to the patient he became acutely short of breath overnight and into today.  Patient states he missed dialysis today but thinks he went on Friday.  Patient arrives on CPAP via emergency traffic, pulse ox readings in the 70s although the patient is awake alert and mentating well.  Patient switched to BiPAP upon arrival.  Patient denies any chest pain does state shortness of breath and states abdominal distention.  Patient appears edematous with significant abdominal distention as well as periorbital edema, mild shortness of breath.   Past Medical History:  Diagnosis Date   Anemia    Aortic atherosclerosis ([place]) 11/12/2019   ED (erectile dysfunction)    Emphysema of lung ([place]) 11/12/2019   ESRD on hemodialysis ([place])    M/W/F in [place], [place]   ETOH abuse    History of blood transfusion    Hypertension    Wears dentures     Patient Active Problem List   Diagnosis Date Noted   Scabies 08/11/2021   Cellulitis, wound, post-operative 08/10/2021   Surgical wound infection 08/07/2021   Infection due to Serratia marcescens 08/07/2021   Wound infection after surgery 08/07/2021   CAP (community acquired pneumonia) 07/30/2021   ABLA (acute blood loss anemia) 07/17/2021   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 ([place]) 07/17/2021   AV fistula  infection, 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/17/2021   AV fistula thrombosis, 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/17/2021   Pressure injury of skin 07/15/2021   Acute on chronic respiratory failure with hypoxemia ([place]) 07/12/2021   Ascites    Protein-calorie malnutrition, severe 06/05/2021   Hypervolemia associated with renal insufficiency 06/02/2021   Symptomatic anemia 05/28/2021   Acute on chronic diastolic CHF (congestive heart failure) ([place]) 05/28/2021   Hyperkalemia 05/28/2021   Pulmonary edema 04/29/2021   Pruritus 04/29/2021   COVID-19 virus infection 04/29/2021   Thrombocytopenia ([place]) 04/08/2021   COPD (chronic obstructive pulmonary disease) ([place]) 04/08/2021   CHF (congestive heart failure) ([place]) 03/25/2021   PAF (paroxysmal atrial fibrillation) (HCC) 03/20/2021   Chronic diastolic CHF (congestive heart failure) ([place]) 03/20/2021   Alcohol abuse 03/20/2021   Vision loss of left eye 03/20/2021   SOB (shortness of breath) 03/20/2021   Shortness of breath 03/10/2021   Abscess of buttock 03/02/2021   Fluid overload 03/02/2021   Multifocal pneumonia 01/13/2021   Abdominal pain 06/19/2020   Current mild episode of major depressive disorder without prior episode ([place]) 04/09/2020   Atrial fibrillation with RVR ([place]) 04/04/2020   Non-compliance with renal dialysis ([place]) 03/21/2020   Acute CHF (congestive heart failure) ([place]) 03/12/2020   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 ([place]) 02/03/2020   Acute on chronic combined systolic and diastolic CHF (congestive heart failure) ([place]) 02/03/2020   Acute on chronic respiratory failure with hypoxia ([place]) 11/12/2019   Aortic atherosclerosis ([place]) 11/12/2019   Emphysema of lung ([place])  11/12/2019   Volume overload 10/05/2019   Elevated troponin 05/17/2018   Anemia in ESRD (end-stage renal disease) ([place]) 05/17/2018   ESRD on dialysis ([place]) 05/26/2017   Essential hypertension 05/26/2017   Moderate protein-calorie malnutrition ([place]) 08/13/2016    Secondary hyperparathyroidism of renal origin ([place]) 08/13/2016   Tobacco abuse 08/04/2016   Hypertensive emergency     Past Surgical History:  Procedure Laterality Date   A/V FISTULAGRAM Left 07/15/2021   Procedure: A/V FISTULAGRAM;  Surgeon: Algernon Huxley, MD;  Location: [facility];  Service: Cardiovascular;  Laterality: Left;   BASCILIC VEIN TRANSPOSITION Left 08/07/2016   Procedure: LEFT BASILIC VEIN TRANSPOSITION;  Surgeon: Angelia Mould, MD;  Location: [facility];  Service: Vascular;  Laterality: Left;   CLOSED REDUCTION NASAL FRACTURE N/A 08/11/2019   Procedure: CLOSED REDUCTION NASAL FRACTURE WITH STABILIZATION;  Surgeon: Irene Limbo, MD;  Location: [facility];  Service: Plastics;  Laterality: N/A;   COLONOSCOPY N/A 08/12/2016   Procedure: COLONOSCOPY;  Surgeon: Otis Brace, MD;  Location: [facility];  Service: Gastroenterology;  Laterality: N/A;   COLONOSCOPY WITH PROPOFOL N/A 06/05/2021   Procedure: COLONOSCOPY WITH PROPOFOL;  Surgeon: Sharyn Creamer, MD;  Location: [facility];  Service: Gastroenterology;  Laterality: N/A;   DIALYSIS/PERMA CATHETER INSERTION N/A 07/26/2021   Procedure: DIALYSIS/PERMA CATHETER INSERTION;  Surgeon: Algernon Huxley, MD;  Location: [facility];  Service: Cardiovascular;  Laterality: N/A;   ESOPHAGOGASTRODUODENOSCOPY N/A 08/12/2016   Procedure: ESOPHAGOGASTRODUODENOSCOPY (EGD);  Surgeon: Otis Brace, MD;  Location: [facility];  Service: Gastroenterology;  Laterality: N/A;   ESOPHAGOGASTRODUODENOSCOPY (EGD) WITH PROPOFOL N/A 06/05/2021   Procedure: ESOPHAGOGASTRODUODENOSCOPY (EGD) WITH PROPOFOL;  Surgeon: Sharyn Creamer, MD;  Location: [facility];  Service: Gastroenterology;  Laterality: N/A;   INSERTION OF DIALYSIS CATHETER N/A 08/07/2016   Procedure: INSERTION OF TUNNELED DIALYSIS CATHETER;  Surgeon: Angelia Mould, MD;  Location: [place] [place];  Service: Vascular;  Laterality: N/A;   LIGATION OF ARTERIOVENOUS  FISTULA  Left 09/12/2016   Procedure: BANDING OF LEFT  ARTERIOVENOUS  FISTULA;  Surgeon: Angelia Mould, MD;  Location: [facility];  Service: Vascular;  Laterality: Left;   LIGATION OF ARTERIOVENOUS  FISTULA Left 07/17/2021   Procedure: LIGATION OF ARTERIOVENOUS  FISTULA;  Surgeon: Katha Cabal, MD;  Location: [facility];  Service: Vascular;  Laterality: Left;  also include placement of temporary dialysis catheter right or left groin   LOWER EXTREMITY INTERVENTION Right 12/02/2018   Procedure: LOWER EXTREMITY INTERVENTION;  Surgeon: Algernon Huxley, MD;  Location: [facility];  Service: Cardiovascular;  Laterality: Right;   POLYPECTOMY  06/05/2021   Procedure: POLYPECTOMY;  Surgeon: Sharyn Creamer, MD;  Location: [facility];  Service: Gastroenterology;;   TEMPORARY DIALYSIS CATHETER Left 07/17/2021   Procedure: TEMPORARY DIALYSIS CATHETER;  Surgeon: Katha Cabal, MD;  Location: [facility];  Service: Vascular;  Laterality: Left;    Prior to Admission medications   Medication Sig Start Date End Date Taking? Authorizing Provider  albuterol (VENTOLIN HFA) 108 (90 Base) MCG/ACT inhaler Inhale 2 puffs into the lungs every 4 (four) hours as needed for wheezing or shortness of breath. 06/05/21   Danford, Suann Larry, MD  calcium acetate (PHOSLO) 667 MG capsule Take 2 capsules (1,334 mg total) by mouth 3 (three) times daily with meals. 06/05/21   Danford, Suann Larry, MD  cinacalcet (SENSIPAR) 30 MG tablet Take 1 tablet (30 mg total) by mouth daily with breakfast. 06/05/21   Danford, Suann Larry, MD  docusate sodium (COLACE) 100 MG capsule Take 1 capsule (100 mg total) by mouth 2 (two) times daily. 06/05/21   Edwin Dada, MD  epoetin alfa (EPOGEN) 4000 UNIT/ML injection Inject 1 mL (4,000 Units total) into the vein every Monday, Wednesday, and Friday with hemodialysis. 06/22/20   Enzo Bi, MD  ferrous sulfate 325 (65 FE) MG tablet Take 1 tablet (325 mg total) by mouth 2 (two) times  daily with a meal. 06/05/21   Danford, Suann Larry, MD  metoprolol tartrate (LOPRESSOR) 50 MG tablet Take 1 tablet (50 mg total) by mouth 2 (two) times daily. 07/04/21   Jennye Boroughs, MD  pantoprazole (PROTONIX) 20 MG tablet Take 1 tablet (20 mg total) by mouth 2 (two) times daily before a meal. 06/05/21 08/07/21  Danford, Suann Larry, MD  sodium zirconium cyclosilicate (LOKELMA) 5 g packet Take 5 g (one pack) once daily on days that you do not do dialysis.  Follow up with your kidney doctor. 08/29/21   Danford, Suann Larry, MD    Allergies  Allergen Reactions   Lisinopril Swelling    Family History  Problem Relation Age of Onset   Diabetes Mother    Kidney failure Mother    Healthy Father    Kidney failure Brother    Healthy Sister    Kidney disease Daughter    Post-traumatic stress disorder Neg Hx    Bladder Cancer Neg Hx    Kidney cancer Neg Hx     Social History Social History   Tobacco Use   Smoking status: Every Day    Packs/day: 0.50    Years: 40.00    Pack years: 20.00    Types: Cigarettes   Smokeless tobacco: Never  Vaping Use   Vaping Use: Never used  Substance Use Topics   Alcohol use: Not Currently    Alcohol/week: 21.0 standard drinks    Types: 21 Cans of beer per week    Comment: last drink 6 months ago   Drug use: No    Review of Systems Constitutional: Negative for fever. Cardiovascular: Negative for chest pain. Respiratory: Positive shortness of breath worse since this morning. Gastrointestinal: Negative for abdominal pain, vomiting  Genitourinary: On hemodialysis, does not urinate. Musculoskeletal: Negative for musculoskeletal complaints Neurological: Negative for headache All other ROS negative  ____________________________________________   PHYSICAL EXAM:  VITAL SIGNS: ED Triage Vitals  Enc Vitals Group     BP 09/09/21 1359 (!) 163/131     Pulse Rate 09/09/21 1359 (!) 102     Resp 09/09/21 1359 20     Temp 09/09/21 1359 (!)  97.5 F (36.4 C)     Temp Source 09/09/21 1359 Axillary     SpO2 09/09/21 1407 (!) 83 %     Weight 09/09/21 1402 160 lb 0.9 oz (72.6 kg)     Height 09/09/21 1402 5\' 4"  (1.626 m)     Head Circumference --      Peak Flow --      Pain Score 09/09/21 1402 0     Pain Loc --      Pain Edu? --      Excl. in [place]? --    Constitutional: Patient is awake alert arrives on CPAP transition to BiPAP.  Patient is in mild respiratory distress.  Satting in the 70s. Eyes: Normal exam ENT      Head: Normocephalic and atraumatic.      Mouth/Throat: Mucous membranes are moist. Cardiovascular: Normal rate, regular rhythm.  Respiratory: Normal  respiratory effort without tachypnea nor retractions. Breath sounds are clear Gastrointestinal: Patient has significant abdominal distention.  Bedside ultrasound shows likely ascites.  Nontender to palpation. Musculoskeletal: Nontender with normal range of motion in all extremities.  Neurologic:  Normal speech and language. No gross focal neurologic deficits  Skin:  Skin is warm, dry and intact.  Psychiatric: Mood and affect are normal.  ____________________________________________    EKG  EKG viewed and interpreted by myself shows sinus tachycardia 101 bpm with a narrow QRS, normal axis, slight QTC prolongation otherwise largely normal intervals with nonspecific but no concerning ST changes.  ____________________________________________    RADIOLOGY  Chest x-ray shows cardiomegaly with pericardial effusion and vascular congestion  ____________________________________________   INITIAL IMPRESSION / ASSESSMENT AND PLAN / ED COURSE  Pertinent labs & imaging results that were available during my care of the patient were reviewed by me and considered in my medical decision making (see chart for details).   Patient presents to the emergency department for shortness of breath, placed on BiPAP.  Patient appears edematous with periorbital edema, significant  abdominal distention nontender, dull percussion ultrasound confirms significant ascites.  Difficult to get the patient's pulse ox above 85% on 100% oxygen on BiPAP.  I spoke to Dr. Holley Raring of nephrology who is going to arrange for emergent dialysis.  Patient's blood pressure elevated, given nitroglycerin ointment by EMS and 10 mg of IV labetalol upon arrival to the emergency department.  Given the patient's significant abdominal distention and low pulse oximetry I consented the patient for bedside paracentesis.  Bedside paracentesis removed 4 L of fluid, pulse ox has increased to greater than 90%.  Lab work shows hyperkalemia at 7.0, we will dose 16.8 g of V[place].  Remainder the lab work is largely at the patient's baseline.  Troponin is elevated to 182.  No chest pain.  Patient is doing much better continues to be on BiPAP but is satting in the mid 90s now on a lower oxygen percentage.  Nephrology is aware and will be arranging emergent dialysis soon.  We will admit to the hospital service for further work-up and treatment.  Jordan [name] was evaluated in Emergency Department on 09/09/2021 for the symptoms described in the history of present illness. He was evaluated in the context of the global COVID-19 pandemic, which necessitated consideration that the patient might be at risk for infection with the SARS-CoV-2 virus that causes COVID-19. Institutional protocols and algorithms that pertain to the evaluation of patients at risk for COVID-19 are in a state of rapid change based on information released by regulatory bodies including the CDC and federal and state organizations. These policies and algorithms were followed during the patient's care in the ED.  CRITICAL CARE Performed by: Harvest Dark   Total critical care time: 30 minutes  Critical care time was exclusive of separately billable procedures and treating other patients.  Critical care was necessary to treat or prevent imminent or  life-threatening deterioration.  Critical care was time spent personally by me on the following activities: development of treatment plan with patient and/or surrogate as well as nursing, discussions with consultants, evaluation of patient's response to treatment, examination of patient, obtaining history from patient or surrogate, ordering and performing treatments and interventions, ordering and review of laboratory studies, ordering and review of radiographic studies, pulse oximetry and re-evaluation of patient's condition.  .Paracentesis  Date/Time: 09/09/2021 3:18 PM Performed by: Harvest Dark, MD Authorized by: Harvest Dark, MD   Consent:  Consent obtained:  Verbal   Consent given by:  Patient   Risks, benefits, and alternatives were discussed: yes     Risks discussed:  Bleeding, bowel perforation and pain   Alternatives discussed:  No treatment, delayed treatment and observation Universal protocol:    Procedure explained and questions answered to patient or proxy's satisfaction: yes     Test results available: yes     Imaging studies available: yes     Required blood products, implants, devices, and special equipment available: yes     Site/side marked: yes     Immediately prior to procedure, a time out was called: yes     Patient identity confirmed:  Arm band Pre-procedure details:    Procedure purpose:  Therapeutic   Preparation: Patient was prepped and draped in usual sterile fashion   Anesthesia:    Anesthesia method:  Local infiltration   Local anesthetic:  Lidocaine 1% w/o epi Procedure details:    Needle gauge:  18   Ultrasound guidance: yes     Puncture site:  R lower quadrant   Fluid removed amount:  4 liters   Fluid appearance:  Amber   Dressing:  4x4 sterile gauze and adhesive bandage Post-procedure details:    Procedure completion:  Tolerated well, no immediate complications  ____________________________________________   FINAL CLINICAL  IMPRESSION(S) / ED DIAGNOSES  Dyspnea Hypoxia Fluid overload Hyperkalemia   Harvest Dark, MD 09/09/21 1520

## 2021-09-10 ENCOUNTER — Telehealth: Payer: Self-pay

## 2021-09-10 DIAGNOSIS — J9621 Acute and chronic respiratory failure with hypoxia: Secondary | ICD-10-CM

## 2021-09-10 LAB — HEPATITIS B SURFACE ANTIBODY,QUALITATIVE: Hep B S Ab: REACTIVE — AB

## 2021-09-10 LAB — BASIC METABOLIC PANEL
Anion gap: 15 (ref 5–15)
BUN: 64 mg/dL — ABNORMAL HIGH (ref 6–20)
CO2: 25 mmol/L (ref 22–32)
Calcium: 9.2 mg/dL (ref 8.9–10.3)
Chloride: 101 mmol/L (ref 98–111)
Creatinine, Ser: 9.21 mg/dL — ABNORMAL HIGH (ref 0.61–1.24)
GFR, Estimated: 6 mL/min — ABNORMAL LOW (ref >60)
Glucose, Bld: 130 mg/dL — ABNORMAL HIGH (ref 70–99)
Potassium: 4.1 mmol/L (ref 3.5–5.1)
Sodium: 141 mmol/L (ref 135–145)

## 2021-09-10 LAB — RESP PANEL BY RT-PCR (FLU A&B, COVID) ARPGX2
Influenza A by PCR: NEGATIVE
Influenza B by PCR: NEGATIVE
SARS Coronavirus 2 by RT PCR: NEGATIVE

## 2021-09-10 LAB — LIPID PANEL
Cholesterol: 114 mg/dL (ref 0–200)
HDL: 49 mg/dL (ref >40)
LDL Cholesterol: 55 mg/dL (ref 0–99)
Total CHOL/HDL Ratio: 2.3 RATIO
Triglycerides: 51 mg/dL (ref <150)
VLDL: 10 mg/dL (ref 0–40)

## 2021-09-10 LAB — HEPATITIS B SURFACE ANTIGEN: Hepatitis B Surface Ag: NONREACTIVE

## 2021-09-10 MED ORDER — DIPHENHYDRAMINE HCL 25 MG PO CAPS
25.0000 mg | ORAL_CAPSULE | Freq: Once | ORAL | Status: AC
  Administered 2021-09-10: 10:00:00 25 mg via ORAL
  Filled 2021-09-10: qty 1

## 2021-09-10 NOTE — Telephone Encounter (Signed)
Tried to LVM but mailbox is full

## 2021-09-10 NOTE — ED Notes (Signed)
Patient walked out of room and sat in wheelchair in hallway. Security called to come push patient to the lobby. Patient refused vital signs during this RNs shift that started at 7am

## 2021-09-10 NOTE — Discharge Summary (Signed)
°  Jordan [name] is a 55 y.o. male with medical history significant of hypertension, alcohol abuse in remission, tobacco abuse, COPD on prn 2L Oxygen at home, anemia, ESRD-HD (MWF), noncompliance to dialysis, who presents with shortness of breath.  Patient underwent hemodialysis. He has had multiple ER visits and multiple admissions for similar noncompliance issues. Patient left AMA before I saw him.

## 2021-09-10 NOTE — ED Notes (Signed)
Pt states wants to leave but  not willing to sign AMA, states  will wait for the doctor  and will wait for his breakfast meal

## 2021-09-10 NOTE — ED Notes (Addendum)
HD done for this pt , 4L fluid removed per HD,RN...  pt appears comfortable, AAOx4, respi even-unlabored. in NAD    Dr Delanna Ahmadi made aware Pt verbalizing wants to leave  as  HD has been done and according to patient nothing else to do.   Recommends , pt to leave AMA

## 2021-09-10 NOTE — ED Notes (Addendum)
Patient set up with breakfast tray and given cranberry juice. Patient up and ambulating around room asking to leave. Patient debating leaving AMA. Reports his legs are burning. This RN encouraged patient to stay and see MD this morning. Patient reported at this time he will wait to see doctor. Refuses to stay hooked up to monitor

## 2021-09-10 NOTE — ED Notes (Signed)
Secure chat message sent to MD Fritzi Mandes "I went in to give him the benadryl and he was cussing/yelling/verbally aggressive to me. So once I gave him the benadryl, he says he is leaving. I pull up the form for him to sign AMA and ask him to come to the computer and sign. He starts yelling and cussing at me and throwing things. Then once he signed the signature pad he threw the pen right next to me. He has been walking around the room all morning non stop. I told him he can walk himself out and that I was not getting him a wheelchair after he yelled at me to push him out in a wheelchair after throwing things"

## 2021-09-11 ENCOUNTER — Telehealth: Payer: Self-pay

## 2021-09-11 ENCOUNTER — Other Ambulatory Visit: Payer: Self-pay

## 2021-09-11 ENCOUNTER — Encounter: Payer: Self-pay | Admitting: Emergency Medicine

## 2021-09-11 ENCOUNTER — Emergency Department: Payer: Medicare Other

## 2021-09-11 DIAGNOSIS — N5089 Other specified disorders of the male genital organs: Principal | ICD-10-CM | POA: Insufficient documentation

## 2021-09-11 DIAGNOSIS — J9621 Acute and chronic respiratory failure with hypoxia: Secondary | ICD-10-CM | POA: Insufficient documentation

## 2021-09-11 DIAGNOSIS — I132 Hypertensive heart and chronic kidney disease with heart failure and with stage 5 chronic kidney disease, or end stage renal disease: Secondary | ICD-10-CM | POA: Insufficient documentation

## 2021-09-11 DIAGNOSIS — Z20822 Contact with and (suspected) exposure to covid-19: Secondary | ICD-10-CM | POA: Diagnosis not present

## 2021-09-11 DIAGNOSIS — Z8616 Personal history of COVID-19: Secondary | ICD-10-CM | POA: Insufficient documentation

## 2021-09-11 DIAGNOSIS — Z992 Dependence on renal dialysis: Secondary | ICD-10-CM | POA: Insufficient documentation

## 2021-09-11 DIAGNOSIS — I5043 Acute on chronic combined systolic (congestive) and diastolic (congestive) heart failure: Secondary | ICD-10-CM | POA: Insufficient documentation

## 2021-09-11 DIAGNOSIS — F17210 Nicotine dependence, cigarettes, uncomplicated: Secondary | ICD-10-CM | POA: Diagnosis not present

## 2021-09-11 DIAGNOSIS — J449 Chronic obstructive pulmonary disease, unspecified: Secondary | ICD-10-CM | POA: Insufficient documentation

## 2021-09-11 DIAGNOSIS — R000 Tachycardia, unspecified: Secondary | ICD-10-CM | POA: Diagnosis not present

## 2021-09-11 DIAGNOSIS — I480 Paroxysmal atrial fibrillation: Secondary | ICD-10-CM | POA: Diagnosis not present

## 2021-09-11 DIAGNOSIS — N186 End stage renal disease: Secondary | ICD-10-CM | POA: Insufficient documentation

## 2021-09-11 DIAGNOSIS — E8770 Fluid overload, unspecified: Secondary | ICD-10-CM | POA: Diagnosis not present

## 2021-09-11 LAB — CBC WITH DIFFERENTIAL/PLATELET
Abs Immature Granulocytes: 0.12 10*3/uL — ABNORMAL HIGH (ref 0.00–0.07)
Basophils Absolute: 0 10*3/uL (ref 0.0–0.1)
Basophils Relative: 1 %
Eosinophils Absolute: 0.2 10*3/uL (ref 0.0–0.5)
Eosinophils Relative: 2 %
HCT: 23.8 % — ABNORMAL LOW (ref 39.0–52.0)
Hemoglobin: 7.4 g/dL — ABNORMAL LOW (ref 13.0–17.0)
Immature Granulocytes: 2 %
Lymphocytes Relative: 10 %
Lymphs Abs: 0.7 10*3/uL (ref 0.7–4.0)
MCH: 32 pg (ref 26.0–34.0)
MCHC: 31.1 g/dL (ref 30.0–36.0)
MCV: 103 fL — ABNORMAL HIGH (ref 80.0–100.0)
Monocytes Absolute: 0.8 10*3/uL (ref 0.1–1.0)
Monocytes Relative: 11 %
Neutro Abs: 5.4 10*3/uL (ref 1.7–7.7)
Neutrophils Relative %: 74 %
Platelets: 134 10*3/uL — ABNORMAL LOW (ref 150–400)
RBC: 2.31 MIL/uL — ABNORMAL LOW (ref 4.22–5.81)
RDW: 16.7 % — ABNORMAL HIGH (ref 11.5–15.5)
WBC: 7.2 10*3/uL (ref 4.0–10.5)
nRBC: 0 % (ref 0.0–0.2)

## 2021-09-11 LAB — COMPREHENSIVE METABOLIC PANEL
ALT: 15 U/L (ref 0–44)
AST: 26 U/L (ref 15–41)
Albumin: 3.4 g/dL — ABNORMAL LOW (ref 3.5–5.0)
Alkaline Phosphatase: 120 U/L (ref 38–126)
Anion gap: 16 — ABNORMAL HIGH (ref 5–15)
BUN: 80 mg/dL — ABNORMAL HIGH (ref 6–20)
CO2: 24 mmol/L (ref 22–32)
Calcium: 9.3 mg/dL (ref 8.9–10.3)
Chloride: 100 mmol/L (ref 98–111)
Creatinine, Ser: 10.84 mg/dL — ABNORMAL HIGH (ref 0.61–1.24)
GFR, Estimated: 5 mL/min — ABNORMAL LOW (ref >60)
Glucose, Bld: 94 mg/dL (ref 70–99)
Potassium: 4.5 mmol/L (ref 3.5–5.1)
Sodium: 140 mmol/L (ref 135–145)
Total Bilirubin: 1 mg/dL (ref 0.3–1.2)
Total Protein: 7.8 g/dL (ref 6.5–8.1)

## 2021-09-11 LAB — HEPATITIS B SURFACE ANTIBODY, QUANTITATIVE: Hep B S AB Quant (Post): >1000 m[IU]/mL (ref >9.9)

## 2021-09-11 IMAGING — US US SCROTUM W/ DOPPLER COMPLETE
1 series · 14 of 25 positions shown · non-contrast
Comparison: None.

CLINICAL DATA: Scrotal swelling for 1 day

EXAM:
SCROTAL ULTRASOUND
DOPPLER ULTRASOUND OF THE TESTICLES
TECHNIQUE: Complete ultrasound examination of the testicles, epididymis, and
other scrotal structures was performed. Color and spectral Doppler
ultrasound were also utilized to evaluate blood flow to the
testicles.

[Series 1: us scrotum w/doppler · 57 acquisitions, 14 frames shown]
[im 1/57]
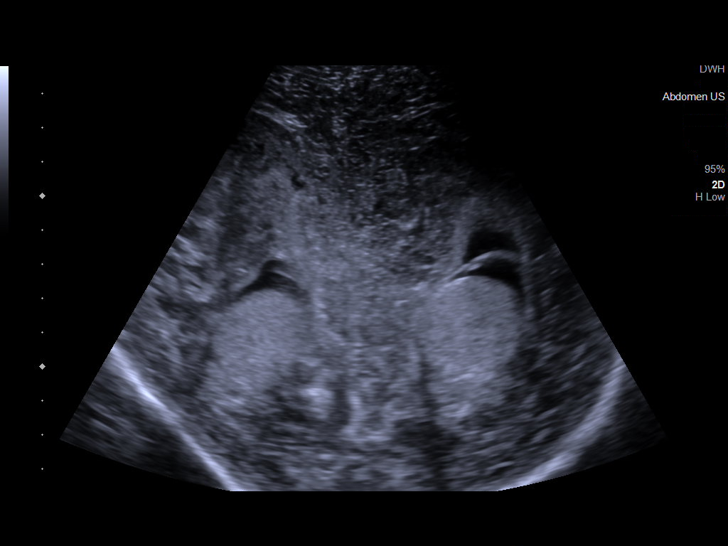
[im 5/57]
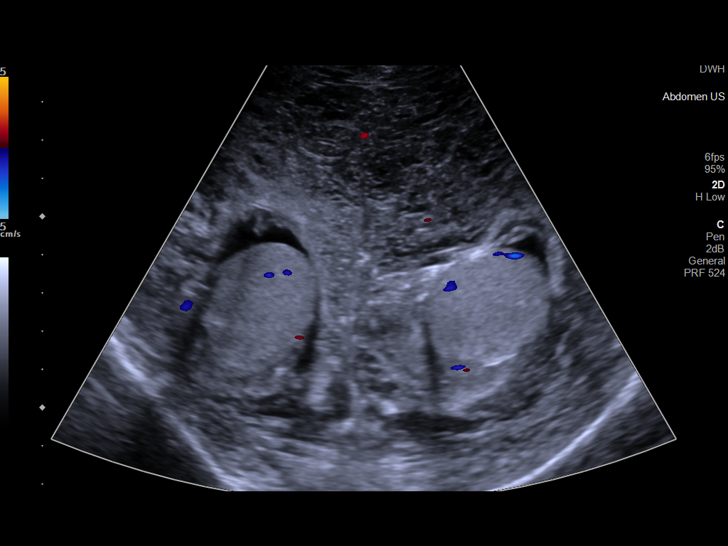
[im 10/57]
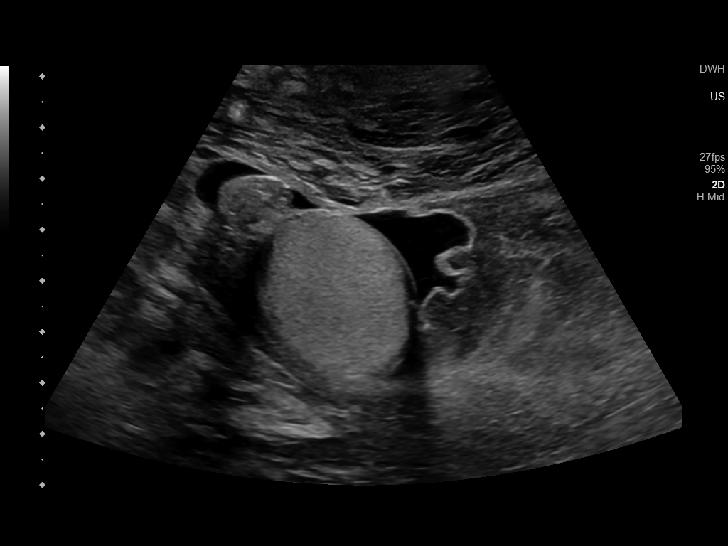
[im 15/57]
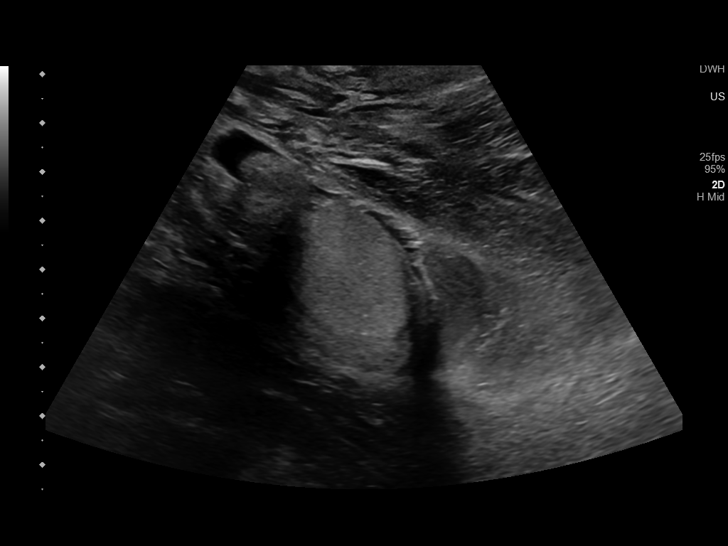
[im 19/57]
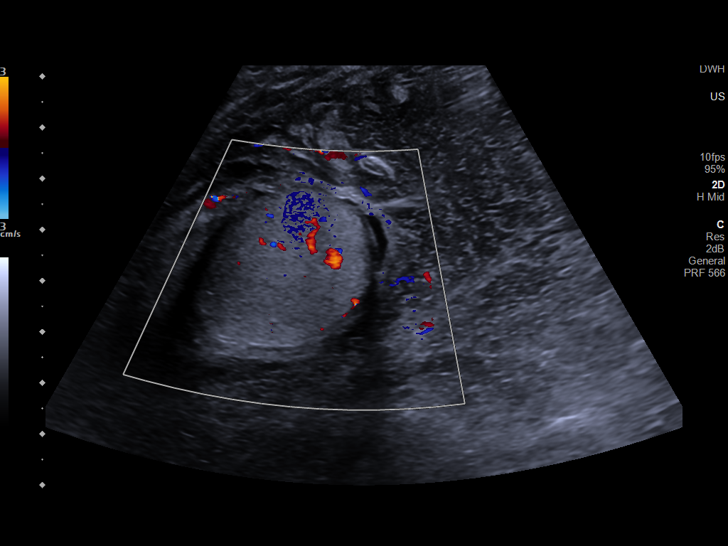
[im 22/57]
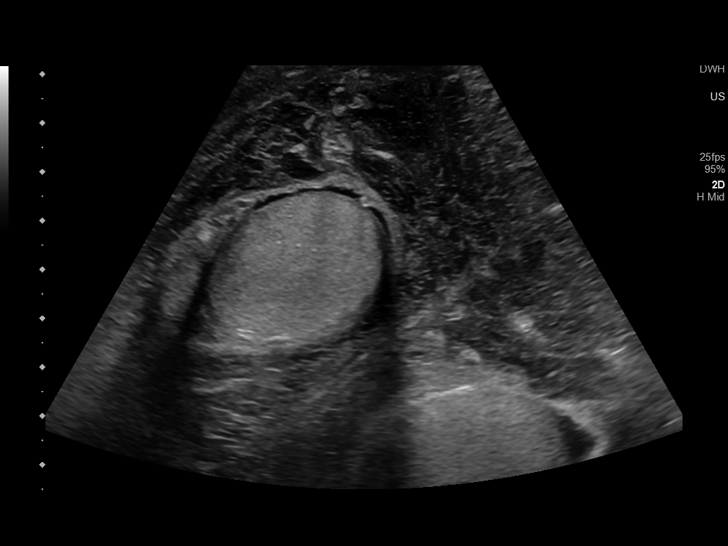
[im 26/57]
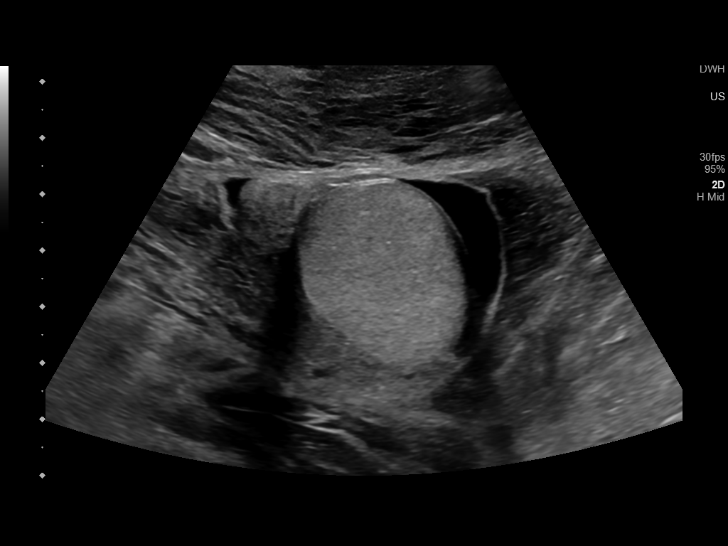
[im 31/57]
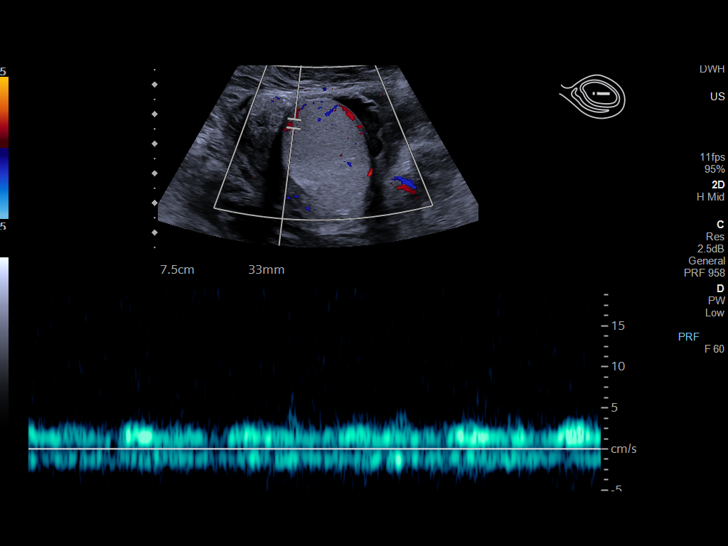
[im 36/57]
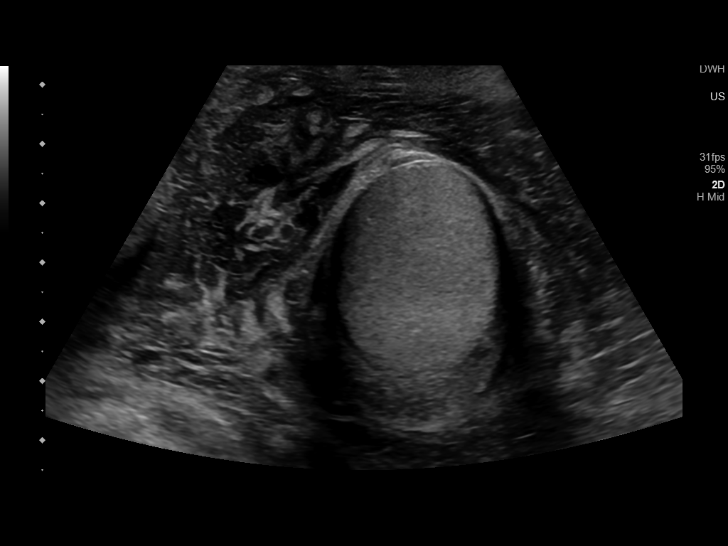
[im 38/57]
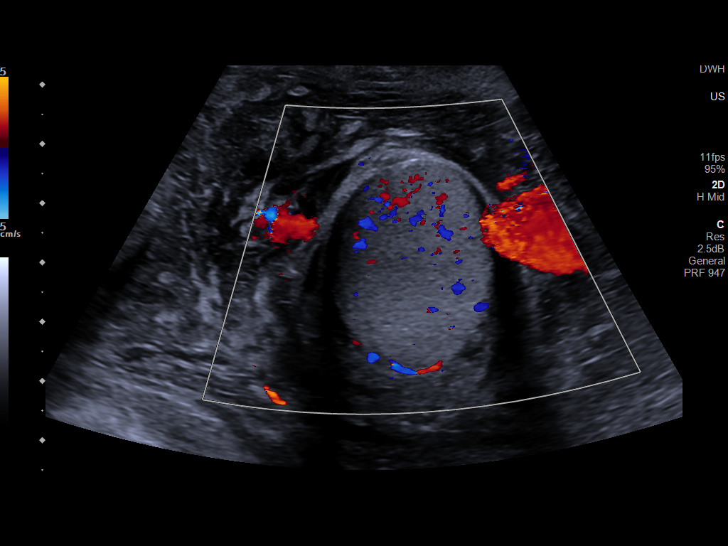
[im 43/57]
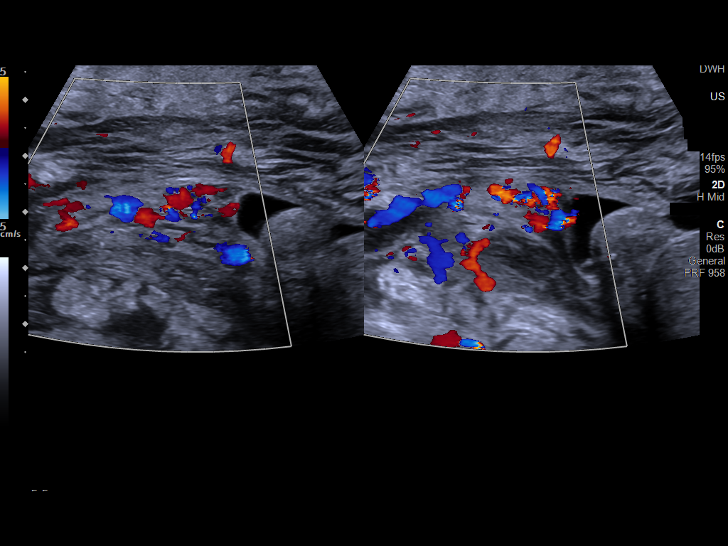
[im 47/57]
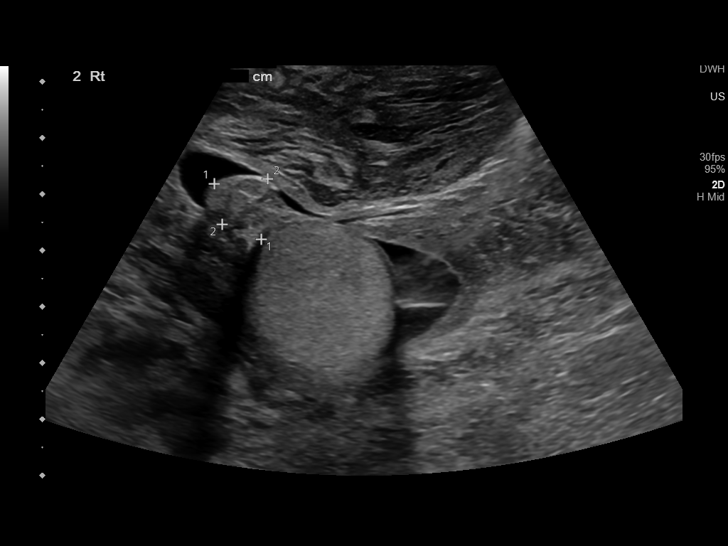
[im 52/57]
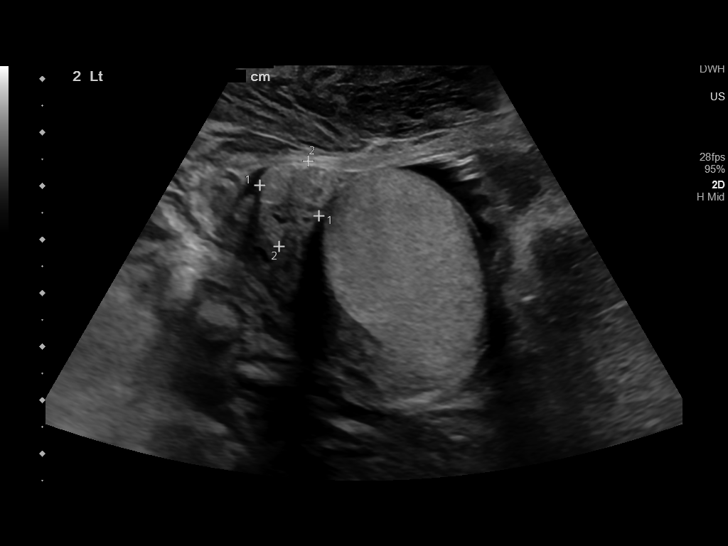
[im 57/57]
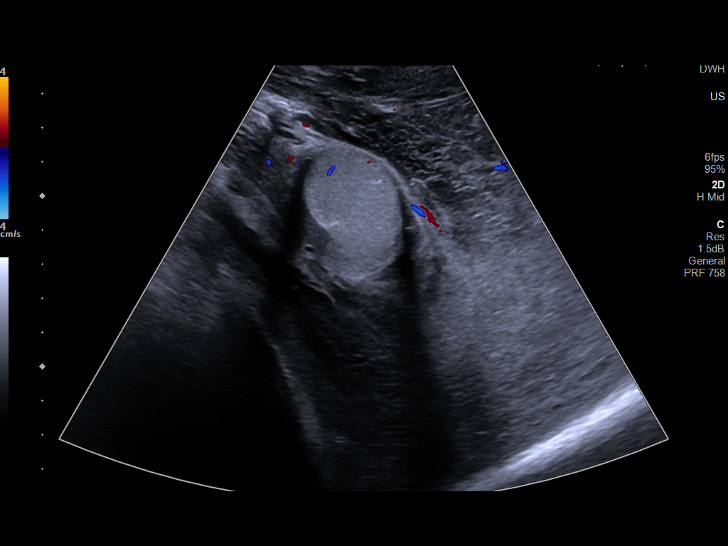

[14 of 25 positions shown; findings below may reference images not displayed]

FINDINGS: Right testicle

Measurements: 3.4 x 2.8 x 3.2 cm. No mass or microlithiasis
visualized.

Left testicle

Measurements: 3.5 x 2.8 x 2.7 cm. No mass or microlithiasis
visualized.

Right epididymis:  Normal in size and appearance.

Left epididymis:  Normal in size and appearance.

Hydrocele:  Small bilateral hydroceles are noted.

Varicocele:  None visualized.

Pulsed Doppler interrogation of both testes demonstrates normal low
resistance arterial and venous waveforms bilaterally.

Note is made of diffuse scrotal edema.
IMPRESSION: Normal-appearing testicles.

Diffuse scrotal edema

Is noted.

## 2021-09-11 NOTE — ED Triage Notes (Signed)
Pt c/o scrotal swelling; dialysis due tomorrow

## 2021-09-11 NOTE — ED Notes (Signed)
Notified MD of pt's assessment findings.

## 2021-09-11 NOTE — ED Notes (Signed)
On assessment, pt's scrotum and penis are grossly swollen and distended. No redness or discharge noted. MD notified.

## 2021-09-11 NOTE — ED Triage Notes (Addendum)
Pt via POV reporting scrotal swelling today with no known injury or prior hernias. Pain currently rated 10/10 nonradiating. No urinary symptoms reported. ESRD patient with M/W/F dialysis schedule, states he was dialyzed yesterday while at the hospital.

## 2021-09-11 NOTE — Telephone Encounter (Signed)
Could not leave VM, X 2,  mailbox full

## 2021-09-12 ENCOUNTER — Observation Stay
Admission: EM | Admit: 2021-09-12 | Discharge: 2021-09-12 | Disposition: A | Discharge status: Home / Self Care | Payer: Medicare Other | Attending: Internal Medicine | Admitting: Internal Medicine

## 2021-09-12 ENCOUNTER — Other Ambulatory Visit: Payer: Self-pay

## 2021-09-12 ENCOUNTER — Emergency Department: Payer: Medicare Other

## 2021-09-12 DIAGNOSIS — E8770 Fluid overload, unspecified: Secondary | ICD-10-CM

## 2021-09-12 DIAGNOSIS — I10 Essential (primary) hypertension: Secondary | ICD-10-CM | POA: Diagnosis present

## 2021-09-12 DIAGNOSIS — N186 End stage renal disease: Secondary | ICD-10-CM

## 2021-09-12 DIAGNOSIS — R609 Edema, unspecified: Secondary | ICD-10-CM

## 2021-09-12 DIAGNOSIS — J449 Chronic obstructive pulmonary disease, unspecified: Secondary | ICD-10-CM | POA: Diagnosis present

## 2021-09-12 DIAGNOSIS — D631 Anemia in chronic kidney disease: Secondary | ICD-10-CM | POA: Diagnosis present

## 2021-09-12 DIAGNOSIS — N5089 Other specified disorders of the male genital organs: Secondary | ICD-10-CM

## 2021-09-12 DIAGNOSIS — J9621 Acute and chronic respiratory failure with hypoxia: Secondary | ICD-10-CM

## 2021-09-12 LAB — RESP PANEL BY RT-PCR (FLU A&B, COVID) ARPGX2
Influenza A by PCR: NEGATIVE
Influenza B by PCR: NEGATIVE
SARS Coronavirus 2 by RT PCR: NEGATIVE

## 2021-09-12 LAB — BRAIN NATRIURETIC PEPTIDE: B Natriuretic Peptide: >4500 pg/mL — ABNORMAL HIGH (ref 0.0–100.0)

## 2021-09-12 IMAGING — CR DG CHEST 1V
1 series · 1 of 1 positions shown · non-contrast
Comparison: Prior radiograph from [DATE].

CLINICAL DATA: Initial evaluation for pulmonary edema.

EXAM:
CHEST  1 VIEW

[chest ap]
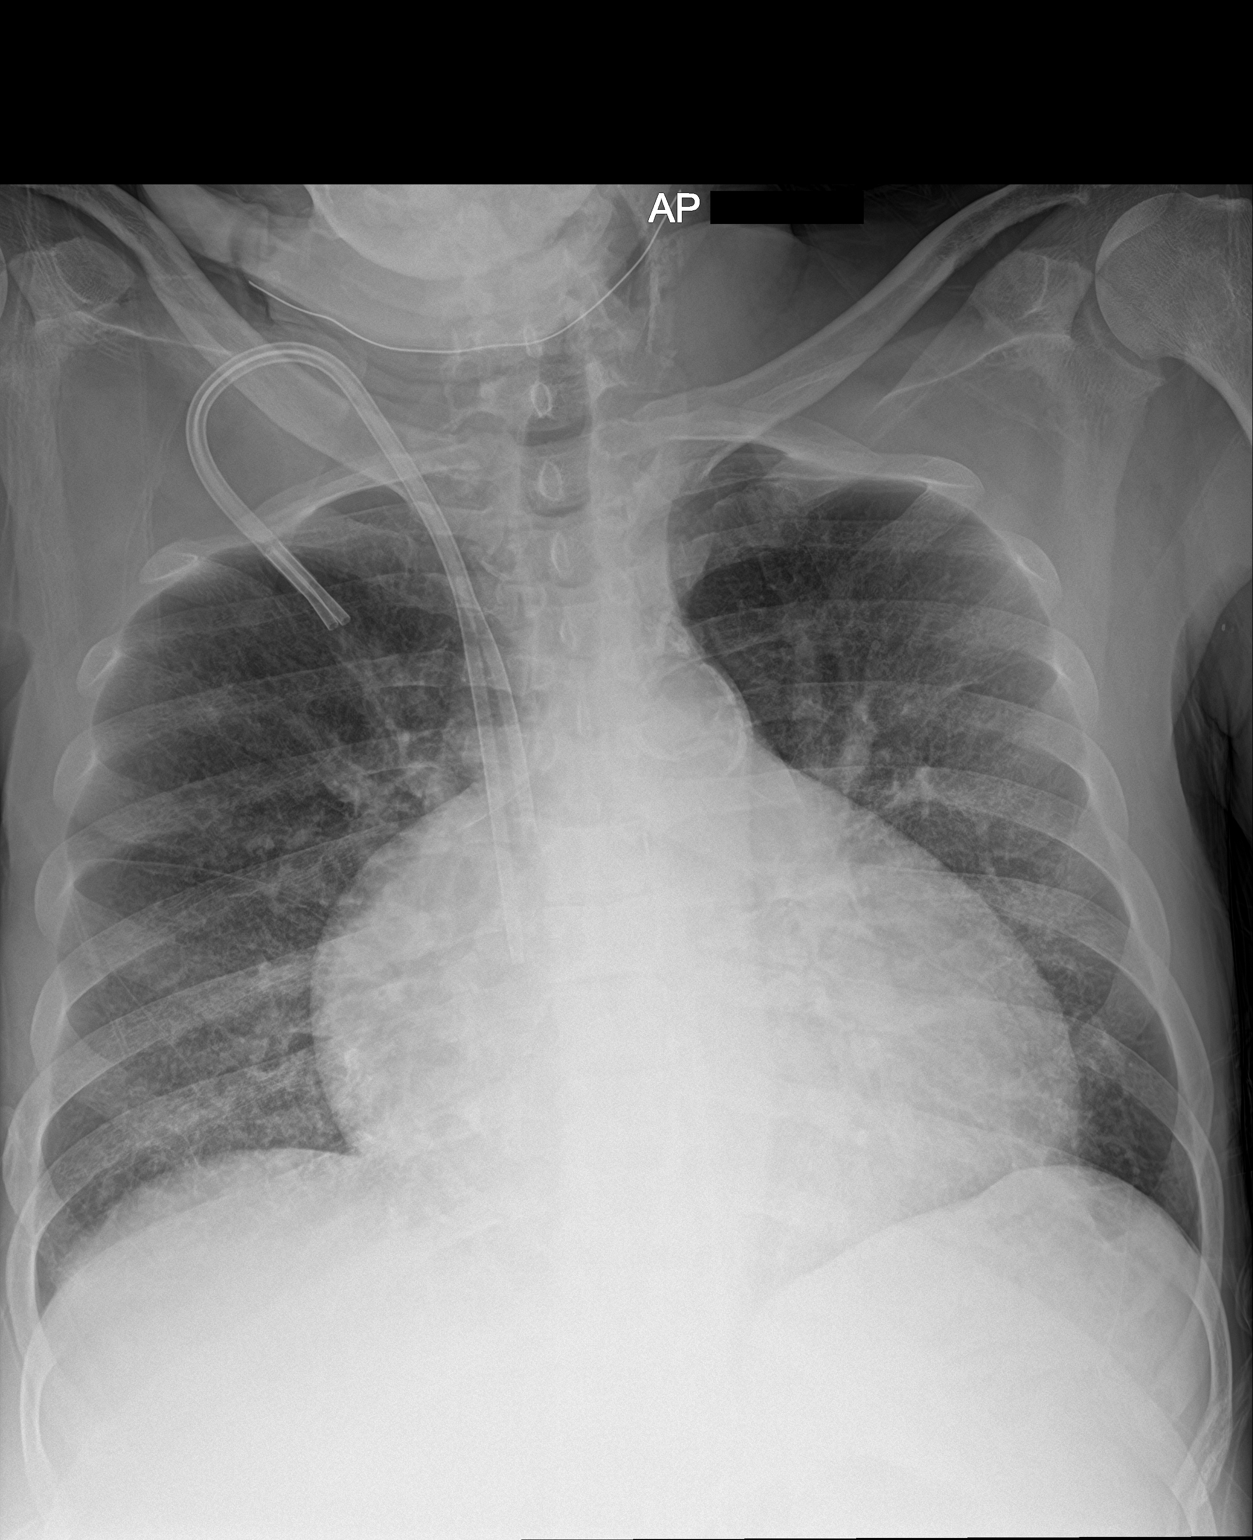

[1 of 1 positions shown; findings below may reference images not displayed]

FINDINGS: Dual-lumen right IJ approach central venous catheter remains in
place with tips overlying the cavoatrial junction. Moderate to
advanced cardiomegaly with globular contour of the cardiac
silhouette, similar to previous, and likely reflecting persistent
pericardial effusion. Mediastinal silhouette normal. Aortic
atherosclerosis.

Lungs mildly hypoinflated. Diffuse vascular and interstitial
prominence consistent with mild diffuse pulmonary interstitial
edema. Overall appearance is improved as compared to [DATE]. No
visible pleural effusion. No focal infiltrates. No pneumothorax.

Osseous structures are unchanged.
IMPRESSION: 1. Mild diffuse pulmonary interstitial edema, mildly improved as
compared to [DATE].
2. Cardiomegaly with persistent globular contour of the cardiac
silhouette, likely reflecting persistent pericardial effusion as
previously seen.
3.  Aortic Atherosclerosis ([TF]-[TF]).

## 2021-09-12 MED ORDER — ONDANSETRON HCL 4 MG PO TABS
4.0000 mg | ORAL_TABLET | Freq: Four times a day (QID) | ORAL | Status: DC | PRN
  Administered 2021-09-12: 11:00:00 4 mg via ORAL
  Filled 2021-09-12: qty 1

## 2021-09-12 MED ORDER — CHLORHEXIDINE GLUCONATE CLOTH 2 % EX PADS
6.0000 | MEDICATED_PAD | Freq: Every day | CUTANEOUS | Status: DC
  Filled 2021-09-12 (×2): qty 6

## 2021-09-12 MED ORDER — ALTEPLASE 2 MG IJ SOLR
2.0000 mg | Freq: Once | INTRAMUSCULAR | Status: DC | PRN
  Filled 2021-09-12: qty 2

## 2021-09-12 MED ORDER — FUROSEMIDE 10 MG/ML IJ SOLN
80.0000 mg | Freq: Once | INTRAMUSCULAR | Status: AC
  Administered 2021-09-12: 06:00:00 80 mg via INTRAVENOUS
  Filled 2021-09-12: qty 8

## 2021-09-12 MED ORDER — SODIUM CHLORIDE 0.9 % IV SOLN: 100.0000 mL | INTRAVENOUS | Status: DC | PRN

## 2021-09-12 MED ORDER — ACETAMINOPHEN 500 MG PO TABS
1000.0000 mg | ORAL_TABLET | Freq: Once | ORAL | Status: AC
  Administered 2021-09-12: 05:00:00 1000 mg via ORAL
  Filled 2021-09-12: qty 2

## 2021-09-12 MED ORDER — LIDOCAINE-PRILOCAINE 2.5-2.5 % EX CREA: 1.0000 "application " | TOPICAL_CREAM | CUTANEOUS | Status: DC | PRN

## 2021-09-12 MED ORDER — ACETAMINOPHEN 325 MG PO TABS
650.0000 mg | ORAL_TABLET | Freq: Four times a day (QID) | ORAL | Status: DC | PRN
  Administered 2021-09-12: 11:00:00 650 mg via ORAL
  Filled 2021-09-12: qty 2

## 2021-09-12 MED ORDER — PENTAFLUOROPROP-TETRAFLUOROETH EX AERO
1.0000 "application " | INHALATION_SPRAY | CUTANEOUS | Status: DC | PRN
  Filled 2021-09-12: qty 30

## 2021-09-12 MED ORDER — HEPARIN SODIUM (PORCINE) 1000 UNIT/ML IJ SOLN
INTRAMUSCULAR | Status: AC
  Filled 2021-09-12: qty 10

## 2021-09-12 MED ORDER — METOPROLOL TARTRATE 50 MG PO TABS
50.0000 mg | ORAL_TABLET | Freq: Two times a day (BID) | ORAL | Status: DC
  Administered 2021-09-12: 10:00:00 50 mg via ORAL
  Filled 2021-09-12: qty 1

## 2021-09-12 MED ORDER — HEPARIN SODIUM (PORCINE) 1000 UNIT/ML DIALYSIS
1000.0000 [IU] | INTRAMUSCULAR | Status: DC | PRN
  Filled 2021-09-12: qty 1

## 2021-09-12 MED ORDER — CINACALCET HCL 30 MG PO TABS
30.0000 mg | ORAL_TABLET | Freq: Every day | ORAL | Status: DC
  Administered 2021-09-12: 08:00:00 30 mg via ORAL
  Filled 2021-09-12 (×2): qty 1

## 2021-09-12 MED ORDER — ONDANSETRON HCL 4 MG/2ML IJ SOLN: 4.0000 mg | Freq: Four times a day (QID) | INTRAMUSCULAR | Status: DC | PRN

## 2021-09-12 MED ORDER — LIDOCAINE HCL (PF) 1 % IJ SOLN: 5.0000 mL | INTRAMUSCULAR | Status: DC | PRN

## 2021-09-12 MED ORDER — ACETAMINOPHEN 650 MG RE SUPP: 650.0000 mg | Freq: Four times a day (QID) | RECTAL | Status: DC | PRN

## 2021-09-12 NOTE — ED Notes (Signed)
Report given to Dialysis Center.

## 2021-09-12 NOTE — Care Management Obs Status (Signed)
[place] NOTIFICATION   Patient Details  Name: BRENNON OTTERNESS MRN: [id number] Date of Birth: [birth year]   Medicare Observation Status Notification Given:  Yes    Anselm Pancoast, RN 09/12/2021, 4:05 PM

## 2021-09-12 NOTE — Discharge Summary (Signed)
Physician Discharge Summary  Jordan [name] GYJ:[id number] DOB: April 30, 1966 DOA: 09/12/2021  PCP: Hoy Register, MD  Admit date: 09/12/2021 Discharge date: 09/12/2021  Admitted From: Home Disposition:  AMA  Recommendations for Outpatient Follow-up:  Follow up with PCP in 1-2 weeks Please obtain BMP/CBC in one week Please follow up on the following pending results:  Home Health: NO Equipment/Devices:None   Discharge Condition:Stable CODE STATUS: FULL Diet recommendation: Renal diet     Brief/Interim Summary: Jordan [name] is a 55 y.o. male with medical history significant for end-stage renal disease on hemodialysis (dialysis days are M/W/F), anemia of chronic kidney disease, COPD chronic respiratory failure on 2 L, hypertension who presents to the emergency room for evaluation of scrotal swelling patient states that he has had for about 2 days.  He rated his pain a 10 x 10 in intensity at its worst and it is nonradiating.  He admitted to being noncompliant with his scheduled dialysis treatments and missed his session 1 day prior to this admission.   He denied having any shortness of breath, no chest pain, no nausea, no vomiting, no diaphoresis, no palpitations, no fever, no chills, no abdominal pain or any changes in his bowel habits. Sodium 140, potassium 4.5, chloride 100, bicarb 24, glucose 94, BUN 80, creatinine 10.4, calcium 9.3, alkaline phosphatase 120, albumin 3.4, AST 26, ALT 15, total protein 7.8, BNP greater than 4500, white count 7.2, hemoglobin 7.4, hematocrit 23.8, MCV 103, RDW 16.7, platelet count 134 Respiratory viral panel was negative Chest x-ray reviewed by me shows mild diffuse pulmonary interstitial edema.  Cardiomegaly with persistent globular contour of the cardiac silhouette, likely reflecting persistent pericardial effusion. Twelve-lead EKG reviewed by me shows sinus tachycardia with PACs.  Patient had his dialysis session and feels better and has  requested to be discharged home. Patient noted to be hypotensive and advised to stay back in the hospital but he left AGAINST MEDICAL ADVICE  Discharge Diagnoses:  Principal Problem:   Scrotal swelling Active Problems:   ESRD on dialysis [facility])   Essential hypertension   Anemia in ESRD (end-stage renal disease) ([place])   COPD (chronic obstructive pulmonary disease) ([place])    Discharge Instructions  Discharge Instructions     Increase activity slowly   Complete by: As directed    No wound care   Complete by: As directed       Allergies as of 09/12/2021       Reactions   Lisinopril Swelling        Medication List     STOP taking these medications    docusate sodium 100 MG capsule Commonly known as: COLACE   Lokelma 5 g packet Generic drug: sodium zirconium cyclosilicate   pantoprazole 20 MG tablet Commonly known as: Protonix       TAKE these medications    albuterol 108 (90 Base) MCG/ACT inhaler Commonly known as: VENTOLIN HFA Inhale 2 puffs into the lungs every 4 (four) hours as needed for wheezing or shortness of breath.   calcium acetate 667 MG capsule Commonly known as: PHOSLO Take 2 capsules (1,334 mg total) by mouth 3 (three) times daily with meals.   cinacalcet 30 MG tablet Commonly known as: SENSIPAR Take 1 tablet (30 mg total) by mouth daily with breakfast.   epoetin alfa 4000 UNIT/ML injection Commonly known as: EPOGEN Inject 1 mL (4,000 Units total) into the vein every Monday, Wednesday, and Friday with hemodialysis.   ferrous sulfate 325 (65 FE) MG tablet Take 1  tablet (325 mg total) by mouth 2 (two) times daily with a meal.   metoprolol tartrate 50 MG tablet Commonly known as: LOPRESSOR Take 1 tablet (50 mg total) by mouth 2 (two) times daily.        Allergies  Allergen Reactions   Lisinopril Swelling    Consultations: Nephrology   Procedures/Studies: CT ABDOMEN PELVIS WO CONTRAST  Result Date: 08/20/2021 CLINICAL  DATA:  Abdominal pain and distention. End-stage renal disease on dialysis. EXAM: CT ABDOMEN AND PELVIS WITHOUT CONTRAST TECHNIQUE: Multidetector CT imaging of the abdomen and pelvis was performed following the standard protocol without IV contrast. COMPARISON:  05/10/2021 and 05/03/2021 FINDINGS: Lower chest: Multiple ill-defined nodular opacities are seen in the superior left lower lobe and lingula, which are new since previous studies several months ago, and most likely inflammatory or infectious in etiology. A moderate pericardial effusion shows no significant change. Hepatobiliary: No mass visualized on this unenhanced exam. Gallbladder is unremarkable no evidence of biliary ductal dilatation. Pancreas: No mass or inflammatory process visualized on this unenhanced exam. Spleen:  Within normal limits in size. Adrenals/Urinary tract: Severe diffuse bilateral renal parenchymal atrophy is consistent with chronic end-stage renal disease. No evidence of hydronephrosis. Urinary bladder is nearly completely empty. Stomach/Bowel: Unremarkable. Vascular/Lymphatic: No pathologically enlarged lymph nodes identified. No evidence of abdominal aortic aneurysm. Aortic atherosclerotic calcification noted. Mild dilatation of the right common iliac artery is again seen measuring 1.9 cm. 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 Reproductive:  Stable mild to moderately enlarged prostate. Other: Large amount of ascites and diffuse mesenteric and body wall edema show no significant change. Musculoskeletal: No suspicious bone lesions identified. Diffuse osteosclerosis, consistent with renal osteodystrophy. IMPRESSION: No significant change in large amount of ascites, and diffuse mesenteric and body wall edema. Stable moderate pericardial effusion. New ill-defined nodular opacities in superior left lower lobe and lingula, most likely inflammatory or infectious in etiology.  Continued follow-up by chest CT is recommended in 2-3 months. 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 Electronically Signed   By: Marlaine Hind M.D.   On: 08/20/2021 20:20   DG Chest 1 View  Result Date: 09/12/2021 CLINICAL DATA:  Initial evaluation for pulmonary edema. EXAM: CHEST  1 VIEW COMPARISON:  Prior radiograph from 09/09/2021. FINDINGS: Dual-lumen right IJ approach central venous catheter remains in place with tips overlying the cavoatrial junction. Moderate to advanced cardiomegaly with globular contour of the cardiac silhouette, similar to previous, and likely reflecting persistent pericardial effusion. Mediastinal silhouette normal. Aortic atherosclerosis. Lungs mildly hypoinflated. Diffuse vascular and interstitial prominence consistent with mild diffuse pulmonary interstitial edema. Overall appearance is improved as compared to 09/09/2021. No visible pleural effusion. No focal infiltrates. No pneumothorax. Osseous structures are unchanged. IMPRESSION: 1. Mild diffuse pulmonary interstitial edema, mildly improved as compared to 09/09/2021. 2. Cardiomegaly with persistent globular contour of the cardiac silhouette, likely reflecting persistent pericardial effusion as previously seen. 3.  Aortic Atherosclerosis (ICD10-I70.0). Electronically Signed   By: Jeannine Boga M.D.   On: 09/12/2021 02:48   DG Chest 2 View  Result Date: 09/02/2021 CLINICAL DATA:  Shortness of breath EXAM: CHEST - 2 VIEW COMPARISON:  08/20/2021 FINDINGS: Right dialysis catheter remains in place, unchanged. Cardiomegaly. No confluent opacities, effusions or edema. No acute bony abnormality. Aortic atherosclerosis. IMPRESSION: Cardiomegaly.  No active disease. Electronically Signed   By: Rolm Baptise M.D.   On: 09/02/2021 22:06   US Paracentesis  Result Date: 08/22/2021 INDICATION:  Renal failure on hemodialysis.  Abdominal distension and ascites. EXAM: ULTRASOUND GUIDED  PARACENTESIS  MEDICATIONS: Lidocaine 1% subcutaneous COMPLICATIONS: None immediate. PROCEDURE: Informed written consent was obtained from the patient after a discussion of the risks, benefits and alternatives to treatment. A timeout was performed prior to the initiation of the procedure. Initial ultrasound scanning demonstrates a large amount of ascites within the right lower abdominal quadrant. The right lower abdomen was prepped and draped in the usual sterile fashion. 1% lidocaine was used for local anesthesia. Following this, a 6 Fr Safe-T-Centesis catheter was introduced. An ultrasound image was saved for documentation purposes. The paracentesis was performed. The catheter was removed and a dressing was applied. The patient tolerated the procedure well without immediate post procedural complication. Patient received post-procedure intravenous albumin; see nursing notes for details. FINDINGS: A total of approximately 4 L of clear yellow fluid was removed. Samples were sent to the laboratory as requested by the clinical team. IMPRESSION: Successful ultrasound-guided paracentesis yielding 4 liters of peritoneal fluid. Electronically Signed   By: Lucrezia Europe M.D.   On: 08/22/2021 08:05   DG Chest Portable 1 View  Result Date: 09/09/2021 CLINICAL DATA:  Shortness of breath EXAM: PORTABLE CHEST 1 VIEW COMPARISON:  Chest x-ray 09/02/2021 FINDINGS: Cardiac silhouette is enlarged similar to previous study. Calcified plaques in the aortic arch. Right-sided central line stable. Pulmonary vascular congestion with no focal consolidation. No pleural effusion or pneumothorax identified. IMPRESSION: Grossly stable cardiomegaly and pericardial effusion as seen on prior CT. Pulmonary vascular congestion. Electronically Signed   By: Ofilia Neas M.D.   On: 09/09/2021 14:24   DG Chest Portable 1 View  Result Date: 08/20/2021 CLINICAL DATA:  Chest pain EXAM: PORTABLE CHEST 1 VIEW COMPARISON:  08/10/2021 FINDINGS: Moderate  cardiomegaly. Unchanged position of right chest wall dual lumen catheter. Mild interstitial pulmonary edema is unchanged. No focal consolidation. No pleural effusion or pneumothorax. IMPRESSION: Cardiomegaly and mild interstitial pulmonary edema, unchanged. Electronically Signed   By: Ulyses Jarred M.D.   On: 08/20/2021 19:45   US SCROTUM W/DOPPLER  Result Date: 09/11/2021 CLINICAL DATA:  Scrotal swelling for 1 day EXAM: SCROTAL ULTRASOUND DOPPLER ULTRASOUND OF THE TESTICLES TECHNIQUE: Complete ultrasound examination of the testicles, epididymis, and other scrotal structures was performed. Color and spectral Doppler ultrasound were also utilized to evaluate blood flow to the testicles. COMPARISON:  None. FINDINGS: Right testicle Measurements: 3.4 x 2.8 x 3.2 cm. No mass or microlithiasis visualized. Left testicle Measurements: 3.5 x 2.8 x 2.7 cm. No mass or microlithiasis visualized. Right epididymis:  Normal in size and appearance. Left epididymis:  Normal in size and appearance. Hydrocele:  Small bilateral hydroceles are noted. Varicocele:  None visualized. Pulsed Doppler interrogation of both testes demonstrates normal low resistance arterial and venous waveforms bilaterally. Note is made of diffuse scrotal edema. IMPRESSION: Normal-appearing testicles. Diffuse scrotal edema Is noted. Electronically Signed   By: Inez Catalina M.D.   On: 09/11/2021 22:53   (Echo, Carotid, EGD, Colonoscopy, ERCP)    Subjective:   Discharge Exam: Vitals:   09/12/21 1445 09/12/21 1500  BP: (!) 66/49 (!) 72/53  Pulse: 73 (!) 103  Resp: 17   Temp:    SpO2:     Vitals:   09/12/21 1415 09/12/21 1430 09/12/21 1445 09/12/21 1500  BP: (!) 84/61 (!) 92/55 (!) 66/49 (!) 72/53  Pulse: 70 71 73 (!) 103  Resp: (!) 23 18 17    Temp:      TempSrc:  SpO2:      Weight:      Height:        General: Pt is alert, awake, not in acute distress Cardiovascular: RRR, S1/S2 +, no rubs, no gallops Respiratory: CTA  bilaterally, no wheezing, no rhonchi Abdominal: Soft, NT, ND, bowel sounds + Extremities: no edema, no cyanosis    The results of significant diagnostics from this hospitalization (including imaging, microbiology, ancillary and laboratory) are listed below for reference.     Microbiology: Recent Results (from the past 240 hour(s))  Resp Panel by RT-PCR (Flu A&B, Covid) Nasopharyngeal Swab     Status: None   Collection Time: 09/10/21  7:40 AM   Specimen: Nasopharyngeal Swab; Nasopharyngeal(NP) swabs in vial transport medium  Result Value Ref Range Status   SARS Coronavirus 2 by RT PCR NEGATIVE NEGATIVE Final    Comment: (NOTE) SARS-CoV-2 target nucleic acids are NOT DETECTED.  The SARS-CoV-2 RNA is generally detectable in upper respiratory specimens during the acute phase of infection. The lowest concentration of SARS-CoV-2 viral copies this assay can detect is 138 copies/mL. A negative result does not preclude SARS-Cov-2 infection and should not be used as the sole basis for treatment or other patient management decisions. A negative result may occur with  improper specimen collection/handling, submission of specimen other than nasopharyngeal swab, presence of viral mutation(s) within the areas targeted by this assay, and inadequate number of viral copies(<138 copies/mL). A negative result must be combined with clinical observations, patient history, and epidemiological information. The expected result is Negative.  Fact Sheet for Patients:  EntrepreneurPulse.com.au  Fact Sheet for Healthcare Providers:  IncredibleEmployment.be  This test is no t yet approved or cleared by the [place] FDA and  has been authorized for detection and/or diagnosis of SARS-CoV-2 by FDA under an Emergency Use Authorization (EUA). This EUA will remain  in effect (meaning this test can be used) for the duration of the COVID-19 declaration under Section  564(b)(1) of the Act, 21 U.S.C.section 360bbb-3(b)(1), unless the authorization is terminated  or revoked sooner.       Influenza A by PCR NEGATIVE NEGATIVE Final   Influenza B by PCR NEGATIVE NEGATIVE Final    Comment: (NOTE) The Xpert Xpress SARS-CoV-2/FLU/RSV plus assay is intended as an aid in the diagnosis of influenza from Nasopharyngeal swab specimens and should not be used as a sole basis for treatment. Nasal washings and aspirates are unacceptable for Xpert Xpress SARS-CoV-2/FLU/RSV testing.  Fact Sheet for Patients: EntrepreneurPulse.com.au  Fact Sheet for Healthcare Providers: IncredibleEmployment.be  This test is not yet approved or cleared by the [place] FDA and has been authorized for detection and/or diagnosis of SARS-CoV-2 by FDA under an Emergency Use Authorization (EUA). This EUA will remain in effect (meaning this test can be used) for the duration of the COVID-19 declaration under Section 564(b)(1) of the Act, 21 U.S.C. section 360bbb-3(b)(1), unless the authorization is terminated or revoked.  Performed at [facility], [address] [place], [place] [address]   Resp Panel by RT-PCR (Flu A&B, Covid) Nasopharyngeal Swab     Status: None   Collection Time: 09/12/21  7:10 AM   Specimen: Nasopharyngeal Swab; Nasopharyngeal(NP) swabs in vial transport medium  Result Value Ref Range Status   SARS Coronavirus 2 by RT PCR NEGATIVE NEGATIVE Final    Comment: (NOTE) SARS-CoV-2 target nucleic acids are NOT DETECTED.  The SARS-CoV-2 RNA is generally detectable in upper respiratory specimens during the acute phase of infection. The lowest concentration  of SARS-CoV-2 viral copies this assay can detect is 138 copies/mL. A negative result does not preclude SARS-Cov-2 infection and should not be used as the sole basis for treatment or other patient management decisions. A negative result may occur with  improper  specimen collection/handling, submission of specimen other than nasopharyngeal swab, presence of viral mutation(s) within the areas targeted by this assay, and inadequate number of viral copies(<138 copies/mL). A negative result must be combined with clinical observations, patient history, and epidemiological information. The expected result is Negative.  Fact Sheet for Patients:  EntrepreneurPulse.com.au  Fact Sheet for Healthcare Providers:  IncredibleEmployment.be  This test is no t yet approved or cleared by the [place] FDA and  has been authorized for detection and/or diagnosis of SARS-CoV-2 by FDA under an Emergency Use Authorization (EUA). This EUA will remain  in effect (meaning this test can be used) for the duration of the COVID-19 declaration under Section 564(b)(1) of the Act, 21 U.S.C.section 360bbb-3(b)(1), unless the authorization is terminated  or revoked sooner.       Influenza A by PCR NEGATIVE NEGATIVE Final   Influenza B by PCR NEGATIVE NEGATIVE Final    Comment: (NOTE) The Xpert Xpress SARS-CoV-2/FLU/RSV plus assay is intended as an aid in the diagnosis of influenza from Nasopharyngeal swab specimens and should not be used as a sole basis for treatment. Nasal washings and aspirates are unacceptable for Xpert Xpress SARS-CoV-2/FLU/RSV testing.  Fact Sheet for Patients: EntrepreneurPulse.com.au  Fact Sheet for Healthcare Providers: IncredibleEmployment.be  This test is not yet approved or cleared by the [place] FDA and has been authorized for detection and/or diagnosis of SARS-CoV-2 by FDA under an Emergency Use Authorization (EUA). This EUA will remain in effect (meaning this test can be used) for the duration of the COVID-19 declaration under Section 564(b)(1) of the Act, 21 U.S.C. section 360bbb-3(b)(1), unless the authorization is terminated or revoked.  Performed at  [facility], [address] [place], [place] [address]      Labs: BNP (last 3 results) Recent Labs    07/31/21 0521 09/09/21 1412 09/11/21 2028  BNP >4,500.0* >4,500.0* >6,237.6*   Basic Metabolic Panel: Recent Labs  Lab 09/09/21 1412 09/09/21 2100 09/09/21 2203 09/10/21 0658 09/11/21 2028  NA 140  --   --  141 140  K 7.0* 5.7* 4.5 4.1 4.5  CL 104  --   --  101 100  CO2 17*  --   --  25 24  GLUCOSE 130*  --   --  130* 94  BUN 119*  --   --  64* 80*  CREATININE 14.90*  --   --  9.21* 10.84*  CALCIUM 9.3  --   --  9.2 9.3   Liver Function Tests: Recent Labs  Lab 09/09/21 1412 09/11/21 2028  AST 23 26  ALT 11 15  ALKPHOS 121 120  BILITOT 1.3* 1.0  PROT 8.9* 7.8  ALBUMIN 3.7 3.4*   No results for input(s): LIPASE, AMYLASE in the last 168 hours. No results for input(s): AMMONIA in the last 168 hours. CBC: Recent Labs  Lab 09/09/21 1412 09/11/21 2028  WBC 9.0 7.2  NEUTROABS  --  5.4  HGB 7.5* 7.4*  HCT 24.3* 23.8*  MCV 102.5* 103.0*  PLT 143* 134*   Cardiac Enzymes: No results for input(s): CKTOTAL, CKMB, CKMBINDEX, TROPONINI in the last 168 hours. BNP: Invalid input(s): POCBNP CBG: No results for input(s): GLUCAP in the last 168 hours. D-Dimer No results  for input(s): DDIMER in the last 72 hours. Hgb A1c No results for input(s): HGBA1C in the last 72 hours. Lipid Profile Recent Labs    09/10/21 0658  CHOL 114  HDL 49  LDLCALC 55  TRIG 51  CHOLHDL 2.3   Thyroid function studies No results for input(s): TSH, T4TOTAL, T3FREE, THYROIDAB in the last 72 hours.  Invalid input(s): FREET3 Anemia work up No results for input(s): VITAMINB12, FOLATE, FERRITIN, TIBC, IRON, RETICCTPCT in the last 72 hours. Urinalysis    Component Value Date/Time   COLORURINE YELLOW (A) 01/13/2021 0153   APPEARANCEUR CLEAR (A) 01/13/2021 0153   LABSPEC 1.012 01/13/2021 0153   PHURINE 9.0 (H) 01/13/2021 0153   GLUCOSEU 150 (A) 01/13/2021 0153   HGBUR  NEGATIVE 01/13/2021 0153   BILIRUBINUR NEGATIVE 01/13/2021 0153   KETONESUR NEGATIVE 01/13/2021 0153   PROTEINUR >=300 (A) 01/13/2021 0153   NITRITE NEGATIVE 01/13/2021 0153   LEUKOCYTESUR NEGATIVE 01/13/2021 0153   Sepsis Labs Invalid input(s): PROCALCITONIN,  WBC,  LACTICIDVEN Microbiology Recent Results (from the past 240 hour(s))  Resp Panel by RT-PCR (Flu A&B, Covid) Nasopharyngeal Swab     Status: None   Collection Time: 09/10/21  7:40 AM   Specimen: Nasopharyngeal Swab; Nasopharyngeal(NP) swabs in vial transport medium  Result Value Ref Range Status   SARS Coronavirus 2 by RT PCR NEGATIVE NEGATIVE Final    Comment: (NOTE) SARS-CoV-2 target nucleic acids are NOT DETECTED.  The SARS-CoV-2 RNA is generally detectable in upper respiratory specimens during the acute phase of infection. The lowest concentration of SARS-CoV-2 viral copies this assay can detect is 138 copies/mL. A negative result does not preclude SARS-Cov-2 infection and should not be used as the sole basis for treatment or other patient management decisions. A negative result may occur with  improper specimen collection/handling, submission of specimen other than nasopharyngeal swab, presence of viral mutation(s) within the areas targeted by this assay, and inadequate number of viral copies(<138 copies/mL). A negative result must be combined with clinical observations, patient history, and epidemiological information. The expected result is Negative.  Fact Sheet for Patients:  EntrepreneurPulse.com.au  Fact Sheet for Healthcare Providers:  IncredibleEmployment.be  This test is no t yet approved or cleared by the [place] FDA and  has been authorized for detection and/or diagnosis of SARS-CoV-2 by FDA under an Emergency Use Authorization (EUA). This EUA will remain  in effect (meaning this test can be used) for the duration of the COVID-19 declaration under Section  564(b)(1) of the Act, 21 U.S.C.section 360bbb-3(b)(1), unless the authorization is terminated  or revoked sooner.       Influenza A by PCR NEGATIVE NEGATIVE Final   Influenza B by PCR NEGATIVE NEGATIVE Final    Comment: (NOTE) The Xpert Xpress SARS-CoV-2/FLU/RSV plus assay is intended as an aid in the diagnosis of influenza from Nasopharyngeal swab specimens and should not be used as a sole basis for treatment. Nasal washings and aspirates are unacceptable for Xpert Xpress SARS-CoV-2/FLU/RSV testing.  Fact Sheet for Patients: EntrepreneurPulse.com.au  Fact Sheet for Healthcare Providers: IncredibleEmployment.be  This test is not yet approved or cleared by the [place] FDA and has been authorized for detection and/or diagnosis of SARS-CoV-2 by FDA under an Emergency Use Authorization (EUA). This EUA will remain in effect (meaning this test can be used) for the duration of the COVID-19 declaration under Section 564(b)(1) of the Act, 21 U.S.C. section 360bbb-3(b)(1), unless the authorization is terminated or revoked.  Performed at [facility], [id number]  [address] [place], [place] [address]   Resp Panel by RT-PCR (Flu A&B, Covid) Nasopharyngeal Swab     Status: None   Collection Time: 09/12/21  7:10 AM   Specimen: Nasopharyngeal Swab; Nasopharyngeal(NP) swabs in vial transport medium  Result Value Ref Range Status   SARS Coronavirus 2 by RT PCR NEGATIVE NEGATIVE Final    Comment: (NOTE) SARS-CoV-2 target nucleic acids are NOT DETECTED.  The SARS-CoV-2 RNA is generally detectable in upper respiratory specimens during the acute phase of infection. The lowest concentration of SARS-CoV-2 viral copies this assay can detect is 138 copies/mL. A negative result does not preclude SARS-Cov-2 infection and should not be used as the sole basis for treatment or other patient management decisions. A negative result may occur with  improper  specimen collection/handling, submission of specimen other than nasopharyngeal swab, presence of viral mutation(s) within the areas targeted by this assay, and inadequate number of viral copies(<138 copies/mL). A negative result must be combined with clinical observations, patient history, and epidemiological information. The expected result is Negative.  Fact Sheet for Patients:  EntrepreneurPulse.com.au  Fact Sheet for Healthcare Providers:  IncredibleEmployment.be  This test is no t yet approved or cleared by the [place] FDA and  has been authorized for detection and/or diagnosis of SARS-CoV-2 by FDA under an Emergency Use Authorization (EUA). This EUA will remain  in effect (meaning this test can be used) for the duration of the COVID-19 declaration under Section 564(b)(1) of the Act, 21 U.S.C.section 360bbb-3(b)(1), unless the authorization is terminated  or revoked sooner.       Influenza A by PCR NEGATIVE NEGATIVE Final   Influenza B by PCR NEGATIVE NEGATIVE Final    Comment: (NOTE) The Xpert Xpress SARS-CoV-2/FLU/RSV plus assay is intended as an aid in the diagnosis of influenza from Nasopharyngeal swab specimens and should not be used as a sole basis for treatment. Nasal washings and aspirates are unacceptable for Xpert Xpress SARS-CoV-2/FLU/RSV testing.  Fact Sheet for Patients: EntrepreneurPulse.com.au  Fact Sheet for Healthcare Providers: IncredibleEmployment.be  This test is not yet approved or cleared by the [place] FDA and has been authorized for detection and/or diagnosis of SARS-CoV-2 by FDA under an Emergency Use Authorization (EUA). This EUA will remain in effect (meaning this test can be used) for the duration of the COVID-19 declaration under Section 564(b)(1) of the Act, 21 U.S.C. section 360bbb-3(b)(1), unless the authorization is terminated or revoked.  Performed at  [facility], [address] [place], [place] [address]      Time coordinating discharge: Over 30 minutes  SIGNED:   Collier Bullock, MD  [facility] 09/12/2021, 4:15 PM Pager   If 7PM-7AM, please contact night-coverage [email or web address] Password [email or web address]

## 2021-09-12 NOTE — ED Notes (Signed)
This rn Enters pt room to recheck BP and sees patient standing naked in room with stool on self, bed and floor. Pt endorsing that they "Couldn't hold it" This RN cleaned patient and assisted with dressing. Pt asking to leave and informed that they cannot be dc'd due to their BP. Pt agreeable to leave AMA. PT signed AMA form and assisted to lobby,

## 2021-09-12 NOTE — Discharge Instructions (Signed)
Keep scheduled hemodialysis appointment (M/W/F) Take medication as recommended Follow-up with your primary care provider

## 2021-09-12 NOTE — H&P (Signed)
History and Physical    Jordan [name] LZJ:[id number] DOB: [birth year] DOA: 09/12/2021  PCP: Pccm, [facility], MD   Patient coming from: Home  I have personally briefly reviewed patient's old medical records in [place]  Chief Complaint: Scrotal swelling  HPI: Jordan [name] is a 55 y.o. male with medical history significant for end-stage renal disease on hemodialysis (dialysis days are M/W/F), anemia of chronic kidney disease, COPD chronic respiratory failure on 2 L, hypertension who presents to the emergency room for evaluation of scrotal swelling patient states that he has had for about 2 days.  He rates his pain a 10 x 10 in intensity at its worst and it is nonradiating.  He admits to being noncompliant with his scheduled dialysis treatments and missed his session 1 day prior to this admission.   He denies having any shortness of breath, no chest pain, no nausea, no vomiting, no diaphoresis, no palpitations, no fever, no chills, no abdominal pain or any changes in his bowel habits. Sodium 140, potassium 4.5, chloride 100, bicarb 24, glucose 94, BUN 80, creatinine 10.4, calcium 9.3, alkaline phosphatase 120, albumin 3.4, AST 26, ALT 15, total protein 7.8, BNP greater than 4500, white count 7.2, hemoglobin 7.4, hematocrit 23.8, MCV 103, RDW 16.7, platelet count 134 Respiratory viral panel is negative Chest x-ray reviewed by me shows mild diffuse pulmonary interstitial edema.  Cardiomegaly with persistent globular contour of the cardiac silhouette, likely reflecting persistent pericardial effusion. Twelve-lead EKG reviewed by me shows sinus tachycardia with PACs.    ED Course: Patient is a 55 year old male who presents to the ER for evaluation of scrotal swelling which patient states started 2 days prior to his presentation.  He has a history of end-stage renal disease and is on hemodialysis but is noncompliant with his treatments. He admits to missing his dialysis treatment on  Wednesday 09/11/21 and his symptoms appear to be related to fluid overload. He will be referred to observation status for further evaluation.  Review of Systems: As per HPI otherwise all other systems reviewed and negative.    Past Medical History:  Diagnosis Date   Anemia    Aortic atherosclerosis ([place]) 11/12/2019   ED (erectile dysfunction)    Emphysema of lung ([place]) 11/12/2019   ESRD on hemodialysis ([place])    M/W/F in [place], [place]   ETOH abuse    History of blood transfusion    Hypertension    Wears dentures     Past Surgical History:  Procedure Laterality Date   A/V FISTULAGRAM Left 07/15/2021   Procedure: A/V FISTULAGRAM;  Surgeon: Algernon Huxley, MD;  Location: [facility];  Service: Cardiovascular;  Laterality: Left;   BASCILIC VEIN TRANSPOSITION Left 08/07/2016   Procedure: LEFT BASILIC VEIN TRANSPOSITION;  Surgeon: Angelia Mould, MD;  Location: [facility];  Service: Vascular;  Laterality: Left;   CLOSED REDUCTION NASAL FRACTURE N/A 08/11/2019   Procedure: CLOSED REDUCTION NASAL FRACTURE WITH STABILIZATION;  Surgeon: Irene Limbo, MD;  Location: [facility];  Service: Plastics;  Laterality: N/A;   COLONOSCOPY N/A 08/12/2016   Procedure: COLONOSCOPY;  Surgeon: Otis Brace, MD;  Location: [facility];  Service: Gastroenterology;  Laterality: N/A;   COLONOSCOPY WITH PROPOFOL N/A 06/05/2021   Procedure: COLONOSCOPY WITH PROPOFOL;  Surgeon: Sharyn Creamer, MD;  Location: [facility];  Service: Gastroenterology;  Laterality: N/A;   DIALYSIS/PERMA CATHETER INSERTION N/A 07/26/2021   Procedure: DIALYSIS/PERMA CATHETER INSERTION;  Surgeon: Algernon Huxley, MD;  Location: [facility];  Service: Cardiovascular;  Laterality: N/A;   ESOPHAGOGASTRODUODENOSCOPY N/A 08/12/2016   Procedure: ESOPHAGOGASTRODUODENOSCOPY (EGD);  Surgeon: Otis Brace, MD;  Location: [facility];  Service: Gastroenterology;  Laterality: N/A;   ESOPHAGOGASTRODUODENOSCOPY (EGD) WITH  PROPOFOL N/A 06/05/2021   Procedure: ESOPHAGOGASTRODUODENOSCOPY (EGD) WITH PROPOFOL;  Surgeon: Sharyn Creamer, MD;  Location: [facility];  Service: Gastroenterology;  Laterality: N/A;   INSERTION OF DIALYSIS CATHETER N/A 08/07/2016   Procedure: INSERTION OF TUNNELED DIALYSIS CATHETER;  Surgeon: Angelia Mould, MD;  Location: [facility];  Service: Vascular;  Laterality: N/A;   LIGATION OF ARTERIOVENOUS  FISTULA Left 09/12/2016   Procedure: BANDING OF LEFT  ARTERIOVENOUS  FISTULA;  Surgeon: Angelia Mould, MD;  Location: [facility];  Service: Vascular;  Laterality: Left;   LIGATION OF ARTERIOVENOUS  FISTULA Left 07/17/2021   Procedure: LIGATION OF ARTERIOVENOUS  FISTULA;  Surgeon: Katha Cabal, MD;  Location: [facility];  Service: Vascular;  Laterality: Left;  also include placement of temporary dialysis catheter right or left groin   LOWER EXTREMITY INTERVENTION Right 12/02/2018   Procedure: LOWER EXTREMITY INTERVENTION;  Surgeon: Algernon Huxley, MD;  Location: [facility];  Service: Cardiovascular;  Laterality: Right;   POLYPECTOMY  06/05/2021   Procedure: POLYPECTOMY;  Surgeon: Sharyn Creamer, MD;  Location[facility];  Service: Gastroenterology;;   TEMPORARY DIALYSIS CATHETER Left 07/17/2021   Procedure: TEMPORARY DIALYSIS CATHETER;  Surgeon: Katha Cabal, MD;  Location: [facility];  Service: Vascular;  Laterality: Left;     reports that he has been smoking cigarettes. He has a 20.00 pack-year smoking history. He has never used smokeless tobacco. He reports that he does not currently use alcohol after a past usage of about 21.0 standard drinks per week. He reports that he does not use drugs.  Allergies  Allergen Reactions   Lisinopril Swelling    Family History  Problem Relation Age of Onset   Diabetes Mother    Kidney failure Mother    Healthy Father    Kidney failure Brother    Healthy Sister    Kidney disease Daughter    Post-traumatic stress disorder Neg Hx     Bladder Cancer Neg Hx    Kidney cancer Neg Hx       Prior to Admission medications   Medication Sig Start Date End Date Taking? Authorizing Provider  albuterol (VENTOLIN HFA) 108 (90 Base) MCG/ACT inhaler Inhale 2 puffs into the lungs every 4 (four) hours as needed for wheezing or shortness of breath. 06/05/21  Yes Danford, Suann Larry, MD  calcium acetate (PHOSLO) 667 MG capsule Take 2 capsules (1,334 mg total) by mouth 3 (three) times daily with meals. 06/05/21  Yes Danford, Suann Larry, MD  cinacalcet (SENSIPAR) 30 MG tablet Take 1 tablet (30 mg total) by mouth daily with breakfast. 06/05/21  Yes Danford, Suann Larry, MD  epoetin alfa (EPOGEN) 4000 UNIT/ML injection Inject 1 mL (4,000 Units total) into the vein every Monday, Wednesday, and Friday with hemodialysis. 06/22/20  Yes Enzo Bi, MD  ferrous sulfate 325 (65 FE) MG tablet Take 1 tablet (325 mg total) by mouth 2 (two) times daily with a meal. 06/05/21  Yes Danford, Suann Larry, MD  docusate sodium (COLACE) 100 MG capsule Take 1 capsule (100 mg total) by mouth 2 (two) times daily. Patient not taking: Reported on 09/09/2021 06/05/21   Edwin Dada, MD  metoprolol tartrate (LOPRESSOR) 50 MG tablet Take 1 tablet (50 mg total) by mouth 2 (two)  times daily. Patient not taking: Reported on 09/09/2021 07/04/21   Jennye Boroughs, MD  pantoprazole (PROTONIX) 20 MG tablet Take 1 tablet (20 mg total) by mouth 2 (two) times daily before a meal. Patient not taking: Reported on 09/09/2021 06/05/21 08/07/21  Edwin Dada, MD  sodium zirconium cyclosilicate (LOKELMA) 5 g packet Take 5 g (one pack) once daily on days that you do not do dialysis.  Follow up with your kidney doctor. Patient not taking: Reported on 09/09/2021 08/29/21   Edwin Dada, MD    Physical Exam: Vitals:   09/12/21 0630 09/12/21 0825 09/12/21 0830 09/12/21 0900  BP: 138/63 (!) 150/96 (!) 143/98 (!) 130/100  Pulse: (!) 108 (!) 106 (!) 102 95   Resp: 18 18 (!) 29 (!) 23  Temp:      TempSrc:      SpO2: 91% 97% 96% 97%  Weight:      Height:         Vitals:   09/12/21 0630 09/12/21 0825 09/12/21 0830 09/12/21 0900  BP: 138/63 (!) 150/96 (!) 143/98 (!) 130/100  Pulse: (!) 108 (!) 106 (!) 102 95  Resp: 18 18 (!) 29 (!) 23  Temp:      TempSrc:      SpO2: 91% 97% 96% 97%  Weight:      Height:          Constitutional: Sleeping but arouses easily. Not in any apparent distress HEENT:      Head: Normocephalic and atraumatic.         Eyes: PERLA, EOMI, Conjunctivae are normal. Sclera is non-icteric.       Mouth/Throat: Mucous membranes are moist.       Neck: Supple with no signs of meningismus. Cardiovascular: Tachycardic. No murmurs, gallops, or rubs. 2+ symmetrical distal pulses are present . No JVD. 2+ LE edema Respiratory: Respiratory effort normal .crackles at the bases bilaterally. No wheezes or rhonchi.  Gastrointestinal: Soft, non tender, and non distended with positive bowel sounds.  Genitourinary: Massive scrotal swelling with areas of excoriation Musculoskeletal: Nontender with normal range of motion in all extremities. No cyanosis, or erythema of extremities. Neurologic:  Face is symmetric. Moving all extremities. No gross focal neurologic deficits . Skin: Skin is warm, dry.  No rash or ulcers Psychiatric: Mood and affect are normal    Labs on Admission: I have personally reviewed following labs and imaging studies  CBC: Recent Labs  Lab 09/09/21 1412 09/11/21 2028  WBC 9.0 7.2  NEUTROABS  --  5.4  HGB 7.5* 7.4*  HCT 24.3* 23.8*  MCV 102.5* 103.0*  PLT 143* 326*   Basic Metabolic Panel: Recent Labs  Lab 09/09/21 1412 09/09/21 2100 09/09/21 2203 09/10/21 0658 09/11/21 2028  NA 140  --   --  141 140  K 7.0* 5.7* 4.5 4.1 4.5  CL 104  --   --  101 100  CO2 17*  --   --  25 24  GLUCOSE 130*  --   --  130* 94  BUN 119*  --   --  64* 80*  CREATININE 14.90*  --   --  9.21* 10.84*  CALCIUM 9.3   --   --  9.2 9.3   GFR: Estimated Creatinine Clearance: 7.1 mL/min (A) (by C-G formula based on SCr of 10.84 mg/dL (H)). Liver Function Tests: Recent Labs  Lab 09/09/21 1412 09/11/21 2028  AST 23 26  ALT 11 15  ALKPHOS 121 120  BILITOT 1.3*  1.0  PROT 8.9* 7.8  ALBUMIN 3.7 3.4*   No results for input(s): LIPASE, AMYLASE in the last 168 hours. No results for input(s): AMMONIA in the last 168 hours. Coagulation Profile: No results for input(s): INR, PROTIME in the last 168 hours. Cardiac Enzymes: No results for input(s): CKTOTAL, CKMB, CKMBINDEX, TROPONINI in the last 168 hours. BNP (last 3 results) No results for input(s): PROBNP in the last 8760 hours. HbA1C: No results for input(s): HGBA1C in the last 72 hours. CBG: No results for input(s): GLUCAP in the last 168 hours. Lipid Profile: Recent Labs    09/10/21 0658  CHOL 114  HDL 49  LDLCALC 55  TRIG 51  CHOLHDL 2.3   Thyroid Function Tests: No results for input(s): TSH, T4TOTAL, FREET4, T3FREE, THYROIDAB in the last 72 hours. Anemia Panel: No results for input(s): VITAMINB12, FOLATE, FERRITIN, TIBC, IRON, RETICCTPCT in the last 72 hours. Urine analysis:    Component Value Date/Time   COLORURINE YELLOW (A) 01/13/2021 0153   APPEARANCEUR CLEAR (A) 01/13/2021 0153   LABSPEC 1.012 01/13/2021 0153   PHURINE 9.0 (H) 01/13/2021 0153   GLUCOSEU 150 (A) 01/13/2021 0153   HGBUR NEGATIVE 01/13/2021 0153   BILIRUBINUR NEGATIVE 01/13/2021 0153   KETONESUR NEGATIVE 01/13/2021 0153   PROTEINUR >=300 (A) 01/13/2021 0153   NITRITE NEGATIVE 01/13/2021 0153   LEUKOCYTESUR NEGATIVE 01/13/2021 0153    Radiological Exams on Admission: DG Chest 1 View  Result Date: 09/12/2021 CLINICAL DATA:  Initial evaluation for pulmonary edema. EXAM: CHEST  1 VIEW COMPARISON:  Prior radiograph from 09/09/2021. FINDINGS: Dual-lumen right IJ approach central venous catheter remains in place with tips overlying the cavoatrial junction. Moderate  to advanced cardiomegaly with globular contour of the cardiac silhouette, similar to previous, and likely reflecting persistent pericardial effusion. Mediastinal silhouette normal. Aortic atherosclerosis. Lungs mildly hypoinflated. Diffuse vascular and interstitial prominence consistent with mild diffuse pulmonary interstitial edema. Overall appearance is improved as compared to 09/09/2021. No visible pleural effusion. No focal infiltrates. No pneumothorax. Osseous structures are unchanged. IMPRESSION: 1. Mild diffuse pulmonary interstitial edema, mildly improved as compared to 09/09/2021. 2. Cardiomegaly with persistent globular contour of the cardiac silhouette, likely reflecting persistent pericardial effusion as previously seen. 3.  Aortic Atherosclerosis (ICD10-I70.0). Electronically Signed   By: Jeannine Boga M.D.   On: 09/12/2021 02:48   US SCROTUM W/DOPPLER  Result Date: 09/11/2021 CLINICAL DATA:  Scrotal swelling for 1 day EXAM: SCROTAL ULTRASOUND DOPPLER ULTRASOUND OF THE TESTICLES TECHNIQUE: Complete ultrasound examination of the testicles, epididymis, and other scrotal structures was performed. Color and spectral Doppler ultrasound were also utilized to evaluate blood flow to the testicles. COMPARISON:  None. FINDINGS: Right testicle Measurements: 3.4 x 2.8 x 3.2 cm. No mass or microlithiasis visualized. Left testicle Measurements: 3.5 x 2.8 x 2.7 cm. No mass or microlithiasis visualized. Right epididymis:  Normal in size and appearance. Left epididymis:  Normal in size and appearance. Hydrocele:  Small bilateral hydroceles are noted. Varicocele:  None visualized. Pulsed Doppler interrogation of both testes demonstrates normal low resistance arterial and venous waveforms bilaterally. Note is made of diffuse scrotal edema. IMPRESSION: Normal-appearing testicles. Diffuse scrotal edema Is noted. Electronically Signed   By: Inez Catalina M.D.   On: 09/11/2021 22:53      Assessment/Plan Principal Problem:   Scrotal swelling Active Problems:   ESRD on dialysis [facility])   Essential hypertension   Anemia in ESRD (end-stage renal disease) ([facility])   COPD (chronic obstructive pulmonary disease) ([place])  Patient is a 55 year old male who presents to the ER for evaluation of scrotal swelling.  He has a history of end-stage renal disease and is on hemodialysis.    Scrotal swelling/fluid overload/ESRD Scrotal support Nephrology consult for renal replacement therapy Patient's dialysis days are M/W/F but he has been noncompliant with his schedule    Anemia of end-stage renal disease Continue EPO during dialysis    COPD with chronic respiratory failure Continue as needed bronchodilator therapy Continue oxygen supplementation to maintain pulse oximetry greater than 92%     Hypertension Blood pressure stable Continue metoprolol    GERD Continue Protonix    DVT prophylaxis: SCD Code Status: full code  Family Communication: Greater than 50% of time was spent discussing patient's condition and plan of care with him at the bedside.  All questions and concerns have been addressed.  He verbalizes understanding and agrees with the plan. Disposition Plan: Back to previous home environment Consults called: Nephrology Status: Observation    Jordan Ibe MD [facility]     09/12/2021, 9:21 AM

## 2021-09-12 NOTE — ED Notes (Signed)
Dietary notified of diet order and tray being sent.

## 2021-09-12 NOTE — ED Notes (Signed)
Dialysis came to get Pt, but he refused to go until he eats.  No diet has been ordered.  Will reach out to Hospitalist.

## 2021-09-12 NOTE — ED Notes (Signed)
Pt requesting additional syrup and milk.  Pt educated he is on fluid restrictions.

## 2021-09-12 NOTE — ED Notes (Addendum)
Pt transported to dialysis.  Transporter left belongings in room.  Ariel RN to take them to dialysis.

## 2021-09-12 NOTE — TOC Initial Note (Signed)
Transition of Care [facility]) - Initial/Assessment Note    Patient Details  Name: Jordan [name] MRN: [id number] Date of Birth: 04/04/1966  [facility]) CM/SW Contact:    Anselm Pancoast, RN Phone Number: 09/12/2021, 4:08 PM  Clinical Narrative:                 Called and delivered Code 30 to Cedric-patient cousin who reported he was coming to pick up patient now. No other needs or concerns.         Patient Goals and CMS Choice        Expected Discharge Plan and Services                                                Prior Living Arrangements/Services                       Activities of Daily Living      Permission Sought/Granted                  Emotional Assessment              Admission diagnosis:  Scrotal swelling [N50.89] Patient Active Problem List   Diagnosis Date Noted   Scrotal swelling 09/12/2021   Scabies 08/11/2021   Cellulitis, wound, post-operative 08/10/2021   Surgical wound infection 08/07/2021   Infection due to Serratia marcescens 08/07/2021   Wound infection after surgery 08/07/2021   CAP (community acquired pneumonia) 07/30/2021   ABLA (acute blood loss anemia) 07/17/2021   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 ([place]) 07/17/2021   AV fistula infection, 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/17/2021   AV fistula thrombosis, 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/17/2021   Pressure injury of skin 07/15/2021   Acute on chronic respiratory failure with hypoxemia ([place]) 07/12/2021   Ascites    Protein-calorie malnutrition, severe 06/05/2021   Hypervolemia associated with renal insufficiency 06/02/2021   Symptomatic anemia 05/28/2021   Acute on chronic diastolic CHF (congestive heart failure) ([place]) 05/28/2021   Hyperkalemia 05/28/2021   Pulmonary edema 04/29/2021   Pruritus 04/29/2021   COVID-19 virus infection 04/29/2021   Thrombocytopenia ([place]) 04/08/2021   COPD (chronic obstructive pulmonary disease) ([place])  04/08/2021   CHF (congestive heart failure) ([place]) 03/25/2021   PAF (paroxysmal atrial fibrillation) ([place]) 03/20/2021   Chronic diastolic CHF (congestive heart failure) ([place]) 03/20/2021   Alcohol abuse 03/20/2021   Vision loss of left eye 03/20/2021   SOB (shortness of breath) 03/20/2021   Shortness of breath 03/10/2021   Abscess of buttock 03/02/2021   Fluid overload 03/02/2021   Multifocal pneumonia 01/13/2021   Abdominal pain 06/19/2020   Current mild episode of major depressive disorder without prior episode ([place]) 04/09/2020   Atrial fibrillation with RVR ([place]) 04/04/2020   Non-compliance with renal dialysis ([place]) 03/21/2020   Acute CHF (congestive heart failure) ([place]) 03/12/2020   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 ([place]) 02/03/2020   Acute on chronic combined systolic and diastolic CHF (congestive heart failure) ([place]) 02/03/2020   Acute on chronic respiratory failure with hypoxia ([place]) 11/12/2019   Aortic atherosclerosis ([place]) 11/12/2019   Emphysema of lung ([place]) 11/12/2019   Volume overload 10/05/2019   Elevated troponin 05/17/2018   Anemia in ESRD (end-stage renal disease) ([place]) 05/17/2018   ESRD on dialysis ([place]) 05/26/2017   Essential hypertension 05/26/2017  Moderate protein-calorie malnutrition ([place]) 08/13/2016   Secondary hyperparathyroidism of renal origin ([place]) 08/13/2016   Tobacco abuse 08/04/2016   Hypertensive emergency    PCP:  Pccm, Ander Gaster, MD Pharmacy:   [place], [place] [address] [place] [address] Phone: [phone] Fax: [phone]  [facility] [place], [place] - [address] [employer], [place] [address] Phone: [phone] Fax: [phone]     Social Determinants of Health (SDOH) Interventions    Readmission Risk Interventions Readmission Risk Prevention Plan 07/22/2021  Transportation Screening Complete   Medication Review ([place]) Complete  PCP or Specialist appointment within 3-5 days of discharge Complete  SW Recovery Care/Counseling Consult Complete  [place] Not Applicable  Some recent data might be hidden

## 2021-09-12 NOTE — ED Notes (Signed)
Per direction of NP, folded towel placed in a pillow case and placed under scrotum for support.

## 2021-09-12 NOTE — ED Notes (Signed)
Pt asking to be dc'd hospitalist messaged.

## 2021-09-12 NOTE — Care Management CC44 (Signed)
Condition Code 44 Documentation Completed  Patient Details  Name: Jordan [name] MRN: [id number] Date of Birth: 01-13-1966   Condition Code 44 given:  Yes Patient signature on Condition Code 44 notice:  Yes Documentation of 2 MD's agreement:  Yes Code 44 added to claim:  Yes    Anselm Pancoast, RN 09/12/2021, 4:05 PM

## 2021-09-12 NOTE — ED Notes (Signed)
ED Provider at bedside. 

## 2021-09-12 NOTE — ED Notes (Signed)
Dialysis made aware the Pt is finishing his tray.

## 2021-09-12 NOTE — ED Provider Notes (Addendum)
[facility] Department Provider Note  ____________________________________________   Event Date/Time   First MD Initiated Contact with Patient 09/12/21 0422     (approximate)  I have reviewed the triage vital signs and the nursing notes.   HISTORY  Chief Complaint Groin Swelling   HPI Jordan [name] is a 55 y.o. male  with medical history significant of hypertension, alcohol abuse in remission, tobacco abuse, COPD on prn 2L Oxygen at home, anemia, ESRD-HD (MWF), and noncompliance with dialysis most recently discharged AMA on 12/20 after having been dialyzed today per patient who presents for assessment of some increasing scrotal swelling and discomfort over the last 2 days.  Patient denies any trauma or injuries.  He states he has been wearing 2 L and that his chronic shortness of breath and cough is not any worse today than usual.  He denies any new chest pain, fevers, abdominal pain, vomiting, diarrhea, burning with urination or falls or injuries.  States that since leaving hospital he does not use any alcohol or illegal drugs.  States he still makes a very small amount of urine but has not recently.  He had a similar episode last year with swelling of his scrotum and testicles do seem to resolved with fluid removal on dialysis.  He notes he is having significant transportation issues while he supposed to get dialysis tomorrow he does not have any way to get there and does not think he can do it.  No other acute concerns at this time.         Past Medical History:  Diagnosis Date   Anemia    Aortic atherosclerosis ([place]) 11/12/2019   ED (erectile dysfunction)    Emphysema of lung ([place]) 11/12/2019   ESRD on hemodialysis ([place])    M/W/F in [place], [place]   ETOH abuse    History of blood transfusion    Hypertension    Wears dentures     Patient Active Problem List   Diagnosis Date Noted   Scabies 08/11/2021   Cellulitis, wound,  post-operative 08/10/2021   Surgical wound infection 08/07/2021   Infection due to Serratia marcescens 08/07/2021   Wound infection after surgery 08/07/2021   CAP (community acquired pneumonia) 07/30/2021   ABLA (acute blood loss anemia) 07/17/2021   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 ([place]) 07/17/2021   AV fistula infection, 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/17/2021   AV fistula thrombosis, 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/17/2021   Pressure injury of skin 07/15/2021   Acute on chronic respiratory failure with hypoxemia ([place]) 07/12/2021   Ascites    Protein-calorie malnutrition, severe 06/05/2021   Hypervolemia associated with renal insufficiency 06/02/2021   Symptomatic anemia 05/28/2021   Acute on chronic diastolic CHF (congestive heart failure) ([place]) 05/28/2021   Hyperkalemia 05/28/2021   Pulmonary edema 04/29/2021   Pruritus 04/29/2021   COVID-19 virus infection 04/29/2021   Thrombocytopenia ([place]) 04/08/2021   COPD (chronic obstructive pulmonary disease) ([place]) 04/08/2021   CHF (congestive heart failure) ([place]) 03/25/2021   PAF (paroxysmal atrial fibrillation) ([place]) 03/20/2021   Chronic diastolic CHF (congestive heart failure) ([place]) 03/20/2021   Alcohol abuse 03/20/2021   Vision loss of left eye 03/20/2021   SOB (shortness of breath) 03/20/2021   Shortness of breath 03/10/2021   Abscess of buttock 03/02/2021   Fluid overload 03/02/2021   Multifocal pneumonia 01/13/2021   Abdominal pain 06/19/2020   Current mild episode of major depressive disorder without prior episode ([place]) 04/09/2020   Atrial fibrillation with RVR ([place])  04/04/2020   Non-compliance with renal dialysis ([place]) 03/21/2020   Acute CHF (congestive heart failure) ([place]) 03/12/2020   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) 05/17/2018   ESRD on dialysis ([place]) 05/26/2017   Essential hypertension 05/26/2017   Moderate protein-calorie malnutrition ([place]) 08/13/2016   Secondary hyperparathyroidism of renal origin ([place]) 08/13/2016   Tobacco abuse 08/04/2016   Hypertensive emergency     Past Surgical History:  Procedure Laterality Date   A/V FISTULAGRAM Left 07/15/2021   Procedure: A/V FISTULAGRAM;  Surgeon: Algernon Huxley, MD;  Location: [facility];  Service: Cardiovascular;  Laterality: Left;   BASCILIC VEIN TRANSPOSITION Left 08/07/2016   Procedure: LEFT BASILIC VEIN TRANSPOSITION;  Surgeon: Angelia Mould, MD;  Location: [facility];  Service: Vascular;  Laterality: Left;   CLOSED REDUCTION NASAL FRACTURE N/A 08/11/2019   Procedure: CLOSED REDUCTION NASAL FRACTURE WITH STABILIZATION;  Surgeon: Irene Limbo, MD;  Location: [facility];  Service: Plastics;  Laterality: N/A;   COLONOSCOPY N/A 08/12/2016   Procedure: COLONOSCOPY;  Surgeon: Otis Brace, MD;  Location: [facility];  Service: Gastroenterology;  Laterality: N/A;   COLONOSCOPY WITH PROPOFOL N/A 06/05/2021   Procedure: COLONOSCOPY WITH PROPOFOL;  Surgeon: Sharyn Creamer, MD;  Location: [facility];  Service: Gastroenterology;  Laterality: N/A;   DIALYSIS/PERMA CATHETER INSERTION N/A 07/26/2021   Procedure: DIALYSIS/PERMA CATHETER INSERTION;  Surgeon: Algernon Huxley, MD;  Location: [facility];  Service: Cardiovascular;  Laterality: N/A;   ESOPHAGOGASTRODUODENOSCOPY N/A 08/12/2016   Procedure: ESOPHAGOGASTRODUODENOSCOPY (EGD);  Surgeon: Otis Brace, MD;  Location: [facility];  Service: Gastroenterology;  Laterality: N/A;   ESOPHAGOGASTRODUODENOSCOPY (EGD) WITH PROPOFOL N/A 06/05/2021   Procedure: ESOPHAGOGASTRODUODENOSCOPY (EGD)  WITH PROPOFOL;  Surgeon: Sharyn Creamer, MD;  Location: [facility];  Service: Gastroenterology;  Laterality: N/A;   INSERTION OF DIALYSIS CATHETER N/A 08/07/2016   Procedure: INSERTION OF TUNNELED DIALYSIS CATHETER;  Surgeon: Angelia Mould, MD;  Location: [facility];  Service: Vascular;  Laterality: N/A;   LIGATION OF ARTERIOVENOUS  FISTULA Left 09/12/2016   Procedure: BANDING OF LEFT  ARTERIOVENOUS  FISTULA;  Surgeon: Angelia Mould, MD;  Location: [facility];  Service: Vascular;  Laterality: Left;   LIGATION OF ARTERIOVENOUS  FISTULA Left 07/17/2021   Procedure: LIGATION OF ARTERIOVENOUS  FISTULA;  Surgeon: Katha Cabal, MD;  Location: [facility];  Service: Vascular;  Laterality: Left;  also include placement of temporary dialysis catheter right or left groin   LOWER EXTREMITY INTERVENTION Right 12/02/2018   Procedure: LOWER EXTREMITY INTERVENTION;  Surgeon: Algernon Huxley, MD;  Location: [facility];  Service: Cardiovascular;  Laterality: Right;   POLYPECTOMY  06/05/2021   Procedure: POLYPECTOMY;  Surgeon: Sharyn Creamer, MD;  Location: [facility];  Service: Gastroenterology;;   TEMPORARY DIALYSIS CATHETER Left 07/17/2021   Procedure: TEMPORARY DIALYSIS CATHETER;  Surgeon: Katha Cabal, MD;  Location: [facility];  Service: Vascular;  Laterality: Left;    Prior to Admission medications   Medication Sig Start Date End Date Taking? Authorizing Provider  albuterol (VENTOLIN HFA) 108 (90 Base) MCG/ACT inhaler Inhale 2 puffs into the lungs every 4 (four)  hours as needed for wheezing or shortness of breath. 06/05/21  Yes Danford, Suann Larry, MD  calcium acetate (PHOSLO) 667 MG capsule Take 2 capsules (1,334 mg total) by mouth 3 (three) times daily with meals. 06/05/21  Yes Danford, Suann Larry, MD  cinacalcet (SENSIPAR) 30 MG tablet Take 1 tablet (30 mg total) by mouth daily with breakfast. 06/05/21  Yes Danford, Suann Larry, MD  epoetin alfa (EPOGEN) 4000 UNIT/ML injection  Inject 1 mL (4,000 Units total) into the vein every Monday, Wednesday, and Friday with hemodialysis. 06/22/20  Yes Enzo Bi, MD  ferrous sulfate 325 (65 FE) MG tablet Take 1 tablet (325 mg total) by mouth 2 (two) times daily with a meal. 06/05/21  Yes Danford, Suann Larry, MD  docusate sodium (COLACE) 100 MG capsule Take 1 capsule (100 mg total) by mouth 2 (two) times daily. Patient not taking: Reported on 09/09/2021 06/05/21   Edwin Dada, MD  metoprolol tartrate (LOPRESSOR) 50 MG tablet Take 1 tablet (50 mg total) by mouth 2 (two) times daily. Patient not taking: Reported on 09/09/2021 07/04/21   Jennye Boroughs, MD  pantoprazole (PROTONIX) 20 MG tablet Take 1 tablet (20 mg total) by mouth 2 (two) times daily before a meal. Patient not taking: Reported on 09/09/2021 06/05/21 08/07/21  Edwin Dada, MD  sodium zirconium cyclosilicate (LOKELMA) 5 g packet Take 5 g (one pack) once daily on days that you do not do dialysis.  Follow up with your kidney doctor. Patient not taking: Reported on 09/09/2021 08/29/21   Edwin Dada, MD    Allergies Lisinopril  Family History  Problem Relation Age of Onset   Diabetes Mother    Kidney failure Mother    Healthy Father    Kidney failure Brother    Healthy Sister    Kidney disease Daughter    Post-traumatic stress disorder Neg Hx    Bladder Cancer Neg Hx    Kidney cancer Neg Hx     Social History Social History   Tobacco Use   Smoking status: Every Day    Packs/day: 0.50    Years: 40.00    Pack years: 20.00    Types: Cigarettes   Smokeless tobacco: Never  Vaping Use   Vaping Use: Never used  Substance Use Topics   Alcohol use: Not Currently    Alcohol/week: 21.0 standard drinks    Types: 21 Cans of beer per week    Comment: last drink 6 months ago   Drug use: No    Review of Systems  Review of Systems  Constitutional:  Negative for chills and fever.  HENT:  Negative for sore throat.   Eyes:  Negative  for pain.  Respiratory:  Positive for cough (chronic) and shortness of breath (chronic). Negative for stridor.   Cardiovascular:  Negative for chest pain.  Gastrointestinal:  Negative for vomiting.  Skin:  Negative for rash.  Neurological:  Negative for seizures, loss of consciousness and headaches.  Psychiatric/Behavioral:  Negative for suicidal ideas.   All other systems reviewed and are negative.    ____________________________________________   PHYSICAL EXAM:  VITAL SIGNS: ED Triage Vitals  Enc Vitals Group     BP 09/11/21 2020 136/87     Pulse Rate 09/11/21 2020 (!) 108     Resp 09/11/21 2020 18     Temp 09/11/21 2020 98.3 F (36.8 C)     Temp Source 09/11/21 2020 Oral     SpO2 09/11/21 2020 96 %  Weight 09/11/21 2021 160 lb (72.6 kg)     Height 09/11/21 2021 5\' 4"  (1.626 m)     Head Circumference --      Peak Flow --      Pain Score 09/11/21 2020 10     Pain Loc --      Pain Edu? --      Excl. in [place]? --    Vitals:   09/12/21 0530 09/12/21 0630  BP: 135/89 138/63  Pulse: (!) 104 (!) 108  Resp: 18 18  Temp:    SpO2: 99% 91%   Physical Exam Vitals and nursing note reviewed. Exam conducted with a chaperone present.  Constitutional:      General: He is not in acute distress.    Appearance: He is well-developed. He is ill-appearing.  HENT:     Head: Normocephalic and atraumatic.     Right Ear: External ear normal.     Left Ear: External ear normal.  Eyes:     Conjunctiva/sclera: Conjunctivae normal.  Cardiovascular:     Rate and Rhythm: Regular rhythm. Tachycardia present.     Heart sounds: No murmur heard. Pulmonary:     Effort: Pulmonary effort is normal. No respiratory distress.     Breath sounds: Rales present.  Abdominal:     Palpations: Abdomen is soft.     Tenderness: There is no abdominal tenderness.  Musculoskeletal:        General: No swelling.     Cervical back: Neck supple.     Right lower leg: Edema present.  Skin:    General: Skin is  warm and dry.     Capillary Refill: Capillary refill takes less than 2 seconds.  Neurological:     Mental Status: He is alert and oriented to person, place, and time.  Psychiatric:        Mood and Affect: Mood normal.    Patient's penile shaft and scrotum is diffusely swollen with significant edema and mild tenderness.  There is no induration or erythema or warmth.  ____________________________________________   LABS (all labs ordered are listed, but only abnormal results are displayed)  Labs Reviewed  COMPREHENSIVE METABOLIC PANEL - Abnormal; Notable for the following components:      Result Value   BUN 80 (*)    Creatinine, Ser 10.84 (*)    Albumin 3.4 (*)    GFR, Estimated 5 (*)    Anion gap 16 (*)    All other components within normal limits  CBC WITH DIFFERENTIAL/PLATELET - Abnormal; Notable for the following components:   RBC 2.31 (*)    Hemoglobin 7.4 (*)    HCT 23.8 (*)    MCV 103.0 (*)    RDW 16.7 (*)    Platelets 134 (*)    Abs Immature Granulocytes 0.12 (*)    All other components within normal limits  RESP PANEL BY RT-PCR (FLU A&B, COVID) [id number]  URINALYSIS, ROUTINE W REFLEX MICROSCOPIC  BRAIN NATRIURETIC PEPTIDE   ____________________________________________  EKG  ____________________________________________  RADIOLOGY  ED MD interpretation:    Chest x-ray shows diffuse bilateral interstitial pulmonary edema improved from x-ray obtained on 12/19.  There is cardiomegaly globular contours cardiac silhouette possibly representing persistent pericardial effusion.  There is no new focal consolidation, effusion, pneumothorax or other clear acute thoracic process.  Scrotal ultrasound shows diffuse scrotal edema without evidence of epididymitis, torsion or other acute process.  Official radiology report(s): DG Chest 1 View  Result Date: 09/12/2021 CLINICAL DATA:  Initial  evaluation for pulmonary edema. EXAM: CHEST  1 VIEW COMPARISON:  Prior radiograph from  09/09/2021. FINDINGS: Dual-lumen right IJ approach central venous catheter remains in place with tips overlying the cavoatrial junction. Moderate to advanced cardiomegaly with globular contour of the cardiac silhouette, similar to previous, and likely reflecting persistent pericardial effusion. Mediastinal silhouette normal. Aortic atherosclerosis. Lungs mildly hypoinflated. Diffuse vascular and interstitial prominence consistent with mild diffuse pulmonary interstitial edema. Overall appearance is improved as compared to 09/09/2021. No visible pleural effusion. No focal infiltrates. No pneumothorax. Osseous structures are unchanged. IMPRESSION: 1. Mild diffuse pulmonary interstitial edema, mildly improved as compared to 09/09/2021. 2. Cardiomegaly with persistent globular contour of the cardiac silhouette, likely reflecting persistent pericardial effusion as previously seen. 3.  Aortic Atherosclerosis (ICD10-I70.0). Electronically Signed   By: Jeannine Boga M.D.   On: 09/12/2021 02:48   US SCROTUM W/DOPPLER  Result Date: 09/11/2021 CLINICAL DATA:  Scrotal swelling for 1 day EXAM: SCROTAL ULTRASOUND DOPPLER ULTRASOUND OF THE TESTICLES TECHNIQUE: Complete ultrasound examination of the testicles, epididymis, and other scrotal structures was performed. Color and spectral Doppler ultrasound were also utilized to evaluate blood flow to the testicles. COMPARISON:  None. FINDINGS: Right testicle Measurements: 3.4 x 2.8 x 3.2 cm. No mass or microlithiasis visualized. Left testicle Measurements: 3.5 x 2.8 x 2.7 cm. No mass or microlithiasis visualized. Right epididymis:  Normal in size and appearance. Left epididymis:  Normal in size and appearance. Hydrocele:  Small bilateral hydroceles are noted. Varicocele:  None visualized. Pulsed Doppler interrogation of both testes demonstrates normal low resistance arterial and venous waveforms bilaterally. Note is made of diffuse scrotal edema. IMPRESSION:  Normal-appearing testicles. Diffuse scrotal edema Is noted. Electronically Signed   By: Inez Catalina M.D.   On: 09/11/2021 22:53    ____________________________________________   PROCEDURES  Procedure(s) performed (including Critical Care):  .Critical Care Performed by: Lucrezia Starch, MD Authorized by: Lucrezia Starch, MD   Critical care provider statement:    Critical care time (minutes):  30   Critical care was necessary to treat or prevent imminent or life-threatening deterioration of the following conditions:  Respiratory failure   Critical care was time spent personally by me on the following activities:  Development of treatment plan with patient or surrogate, discussions with consultants, evaluation of patient's response to treatment, examination of patient, ordering and review of laboratory studies, ordering and review of radiographic studies, ordering and performing treatments and interventions, pulse oximetry, re-evaluation of patient's condition and review of old charts   ____________________________________________   INITIAL IMPRESSION / [place] / ED COURSE      Patient presents with above-stated history exam for assessment of increased scrotal swelling and discomfort over the last couple days.  On arrival patient is tachycardic at 115 with otherwise stable vital signs on 2 L.  His scrotum is diffusely swollen slightly tender but there is no evidence of cellulitis.  He does appear grossly volume overloaded with some rales on respiratory lung exam and bilateral pitting edema.  He denies any new respiratory symptoms and states his cough and shortness of breath are at baseline.    With guarded patient's scrotal swelling and edema I suspect this is secondary to volume overload.  There is no evidence of a cellulitis.Scrotal ultrasound shows diffuse scrotal edema without evidence of epididymitis, torsion or other acute process.  Patient is unable provide a urine  sample emergency room which he states is typical as he only makes it very small amount.  Low suspicion for cystitis or urethritis.  Patient states respiratory symptoms are at baseline although he does appear grossly volume overloaded on exam.  I suspect this is probably the etiology of his tachycardia in addition to some discomfort from the scrotal edema.   Chest x-ray shows diffuse bilateral interstitial pulmonary edema improved from x-ray obtained on 12/19.  There is cardiomegaly globular contours cardiac silhouette possibly representing persistent pericardial effusion.  There is no new focal consolidation, effusion, pneumothorax or other clear acute thoracic process.  CMP shows a BUN of 80 and a creatinine of 10.84 without other significant electrolyte or metabolic derangements.  CBC shows no leukocytosis and stable anemia with a hemoglobin of 7.4 compared to 7.5 to 3 days ago.  At this time I have low suspicion for acute infectious process.  It seems patient has fairly significant issues with noncompliance with dialysis and states that he does not even think he can go to get his treatment outpatient tomorrow because he has no transportation.  While he does not have an indication for emergent dialysis i.e. acute on chronic hypoxic restaurant failure, significant dose of acidosis, hyperkalemia he is on the border and certainly could develop respiratory failure if not dialyzed in the next 24 hours I suspect dialysis with volume removal will significantly improve his scrotal symptoms.  I discussed with on-call nephrologist Dr. Holley Raring who states that patient can remain in emergency room until this morning we will have the dialysis social worker see if they can arrange a ride for the patient to get dialyzed today.   Unfortunately while waiting for dialysis coordinator patient's SPO2 was noted to decline.  On reassessment his SPO2 is 90% which increased back up to 95% on 3 L.  Given there is mild component  of acute on chronic hypoxic respiratory failure which I think is related to his volume overload I will admit to medicine service for observation as I think he would likely require multiple dialysis sessions to remove excess volume.  Admitted to hospitalist service for further evaluation and management.     ____________________________________________   FINAL CLINICAL IMPRESSION(S) / ED DIAGNOSES  Final diagnoses:  Swelling  Acute on chronic respiratory failure with hypoxia (HCC)  Scrotal edema  Hypervolemia, unspecified hypervolemia type  ESRD (end stage renal disease) (HCC)    Medications  cinacalcet (SENSIPAR) tablet 30 mg (has no administration in time range)  metoprolol tartrate (LOPRESSOR) tablet 50 mg (has no administration in time range)  acetaminophen (TYLENOL) tablet 1,000 mg (1,000 mg Oral Given 09/12/21 0506)  furosemide (LASIX) injection 80 mg (80 mg Intravenous Given 09/12/21 0554)     ED Discharge Orders     None        Note:  This document was prepared using Dragon voice recognition software and may include unintentional dictation errors.    Lucrezia Starch, MD 09/12/21 5170    Lucrezia Starch, MD 09/12/21 [phone]

## 2021-09-12 NOTE — ED Notes (Signed)
Pt called this Probation officer into the room.  C/o increased pressure on stomach and nausea.  Pt readjusted in bed and PRN medications released.

## 2021-09-12 NOTE — Progress Notes (Signed)
[facility]  ROUNDING NOTE   Subjective:   Jordan [name] is a 55 year old male with multiple admissions for shortness of breath and missed dialysis treatments. He has presented to the ED with complaints of swollen genitals. He will be admitted for Scrotal swelling [N50.89]  Patient is known to our practice from previous admissions. He is setup to receive outpatient dialysis treatments at [facility], but is noncompliant with those treatments.  Patient states overnight his scrotum became swollen.  Denies any recent trauma.  Last dialysis treatment received on Monday, during last admission.  Complains of discomfort from weight of scrotum.  Denies cough and shortness of breath.  Labs acceptable for this patient.  We have been consulted to provide dialysis.   Objective:  Vital signs in last 24 hours:  Temp:  [97.6 F (36.4 C)-98.3 F (36.8 C)] 97.6 F (36.4 C) (12/22 1130) Pulse Rate:  [61-115] 61 (12/22 1345) Resp:  [12-29] 12 (12/22 1300) BP: (92-150)/(57-103) 94/57 (12/22 1345) SpO2:  [91 %-99 %] 97 % (12/22 1145) Weight:  [72.6 kg] 72.6 kg (12/22 0930)  Weight change:  Filed Weights   09/11/21 2021 09/12/21 0930  Weight: 72.6 kg 72.6 kg    Intake/Output: No intake/output data recorded.   Intake/Output this shift:  No intake/output data recorded.  Physical Exam: General: NAD, resting comfortably  Head: Normocephalic, atraumatic. Moist oral mucosal membranes  Eyes: Anicteric  Lungs:  Basilar Rales, normal effort, 2 L O2  Heart: Regular rate and rhythm  Abdomen:  Soft, nontender  Extremities: no peripheral edema.  Angioedema  Neurologic: Nonfocal, moving all four extremities  Skin: No lesions  Access: Rt Permcath  Genitals-edematous penis and scrotum  Basic Metabolic Panel: Recent Labs  Lab 09/09/21 1412 09/09/21 2100 09/09/21 2203 09/10/21 0658 09/11/21 2028  NA 140  --   --  141 140  K 7.0* 5.7* 4.5 4.1 4.5  CL 104  --   --  101  100  CO2 17*  --   --  25 24  GLUCOSE 130*  --   --  130* 94  BUN 119*  --   --  64* 80*  CREATININE 14.90*  --   --  9.21* 10.84*  CALCIUM 9.3  --   --  9.2 9.3     Liver Function Tests: Recent Labs  Lab 09/09/21 1412 09/11/21 2028  AST 23 26  ALT 11 15  ALKPHOS 121 120  BILITOT 1.3* 1.0  PROT 8.9* 7.8  ALBUMIN 3.7 3.4*    No results for input(s): LIPASE, AMYLASE in the last 168 hours. No results for input(s): AMMONIA in the last 168 hours.  CBC: Recent Labs  Lab 09/09/21 1412 09/11/21 2028  WBC 9.0 7.2  NEUTROABS  --  5.4  HGB 7.5* 7.4*  HCT 24.3* 23.8*  MCV 102.5* 103.0*  PLT 143* 134*     Cardiac Enzymes: No results for input(s): CKTOTAL, CKMB, CKMBINDEX, TROPONINI in the last 168 hours.  BNP: Invalid input(s): POCBNP  CBG: No results for input(s): GLUCAP in the last 168 hours.  Microbiology: Results for orders placed or performed during the hospital encounter of 09/12/21  Resp Panel by RT-PCR (Flu A&B, Covid) Nasopharyngeal Swab     Status: None   Collection Time: 09/12/21  7:10 AM   Specimen: Nasopharyngeal Swab; Nasopharyngeal(NP) swabs in vial transport medium  Result Value Ref Range Status   SARS Coronavirus 2 by RT PCR NEGATIVE NEGATIVE Final    Comment: (NOTE) SARS-CoV-2  target nucleic acids are NOT DETECTED.  The SARS-CoV-2 RNA is generally detectable in upper respiratory specimens during the acute phase of infection. The lowest concentration of SARS-CoV-2 viral copies this assay can detect is 138 copies/mL. A negative result does not preclude SARS-Cov-2 infection and should not be used as the sole basis for treatment or other patient management decisions. A negative result may occur with  improper specimen collection/handling, submission of specimen other than nasopharyngeal swab, presence of viral mutation(s) within the areas targeted by this assay, and inadequate number of viral copies(<138 copies/mL). A negative result must be  combined with clinical observations, patient history, and epidemiological information. The expected result is Negative.  Fact Sheet for Patients:  EntrepreneurPulse.com.au  Fact Sheet for Healthcare Providers:  IncredibleEmployment.be  This test is no t yet approved or cleared by the [place] FDA and  has been authorized for detection and/or diagnosis of SARS-CoV-2 by FDA under an Emergency Use Authorization (EUA). This EUA will remain  in effect (meaning this test can be used) for the duration of the COVID-19 declaration under Section 564(b)(1) of the Act, 21 U.S.C.section 360bbb-3(b)(1), unless the authorization is terminated  or revoked sooner.       Influenza A by PCR NEGATIVE NEGATIVE Final   Influenza B by PCR NEGATIVE NEGATIVE Final    Comment: (NOTE) The Xpert Xpress SARS-CoV-2/FLU/RSV plus assay is intended as an aid in the diagnosis of influenza from Nasopharyngeal swab specimens and should not be used as a sole basis for treatment. Nasal washings and aspirates are unacceptable for Xpert Xpress SARS-CoV-2/FLU/RSV testing.  Fact Sheet for Patients: EntrepreneurPulse.com.au  Fact Sheet for Healthcare Providers: IncredibleEmployment.be  This test is not yet approved or cleared by the [place] FDA and has been authorized for detection and/or diagnosis of SARS-CoV-2 by FDA under an Emergency Use Authorization (EUA). This EUA will remain in effect (meaning this test can be used) for the duration of the COVID-19 declaration under Section 564(b)(1) of the Act, 21 U.S.C. section 360bbb-3(b)(1), unless the authorization is terminated or revoked.  Performed at [facility], [address] [place], [place] [address]     Coagulation Studies: No results for input(s): LABPROT, INR in the last 72 hours.  Urinalysis: No results for input(s): COLORURINE, LABSPEC, PHURINE, GLUCOSEU,  HGBUR, BILIRUBINUR, KETONESUR, PROTEINUR, UROBILINOGEN, NITRITE, LEUKOCYTESUR in the last 72 hours.  Invalid input(s): APPERANCEUR    Imaging: DG Chest 1 View  Result Date: 09/12/2021 CLINICAL DATA:  Initial evaluation for pulmonary edema. EXAM: CHEST  1 VIEW COMPARISON:  Prior radiograph from 09/09/2021. FINDINGS: Dual-lumen right IJ approach central venous catheter remains in place with tips overlying the cavoatrial junction. Moderate to advanced cardiomegaly with globular contour of the cardiac silhouette, similar to previous, and likely reflecting persistent pericardial effusion. Mediastinal silhouette normal. Aortic atherosclerosis. Lungs mildly hypoinflated. Diffuse vascular and interstitial prominence consistent with mild diffuse pulmonary interstitial edema. Overall appearance is improved as compared to 09/09/2021. No visible pleural effusion. No focal infiltrates. No pneumothorax. Osseous structures are unchanged. IMPRESSION: 1. Mild diffuse pulmonary interstitial edema, mildly improved as compared to 09/09/2021. 2. Cardiomegaly with persistent globular contour of the cardiac silhouette, likely reflecting persistent pericardial effusion as previously seen. 3.  Aortic Atherosclerosis (ICD10-I70.0). Electronically Signed   By: Jeannine Boga M.D.   On: 09/12/2021 02:48   US SCROTUM W/DOPPLER  Result Date: 09/11/2021 CLINICAL DATA:  Scrotal swelling for 1 day EXAM: SCROTAL ULTRASOUND DOPPLER ULTRASOUND OF THE TESTICLES TECHNIQUE: Complete ultrasound examination of the  testicles, epididymis, and other scrotal structures was performed. Color and spectral Doppler ultrasound were also utilized to evaluate blood flow to the testicles. COMPARISON:  None. FINDINGS: Right testicle Measurements: 3.4 x 2.8 x 3.2 cm. No mass or microlithiasis visualized. Left testicle Measurements: 3.5 x 2.8 x 2.7 cm. No mass or microlithiasis visualized. Right epididymis:  Normal in size and appearance. Left  epididymis:  Normal in size and appearance. Hydrocele:  Small bilateral hydroceles are noted. Varicocele:  None visualized. Pulsed Doppler interrogation of both testes demonstrates normal low resistance arterial and venous waveforms bilaterally. Note is made of diffuse scrotal edema. IMPRESSION: Normal-appearing testicles. Diffuse scrotal edema Is noted. Electronically Signed   By: Inez Catalina M.D.   On: 09/11/2021 22:53     Medications:    sodium chloride     sodium chloride      Chlorhexidine Gluconate Cloth  6 each Topical Q0600   cinacalcet  30 mg Oral Q breakfast   metoprolol tartrate  50 mg Oral BID     Assessment/ Plan:  Mr. ELFEGO GIAMMARINO is a 55 y.o.  male with multiple admissions for shortness of breath and missed dialysis treatments. He has presented to the ED with complaints of scrotal swelling.  [facility] [address]/MWF/Rt Permcath  1.  End-stage renal disease requiring dialysis.  Plan for dialysis, UF goal 2L as tolerated.  UF goal reduced due to decreased blood pressures.  UF achieved 1.1 L.  Next treatment scheduled for Saturday.  2. Anemia of chronic kidney disease Lab Results  Component Value Date   HGB 7.4 (L) 09/11/2021    Will order EPO with next treatment.  3. Secondary Hyperparathyroidism:  Lab Results  Component Value Date   PTH 669 (H) 02/20/2020   CALCIUM 9.3 09/11/2021   CAION 1.12 (L) 06/02/2021   PHOS 7.7 (H) 08/22/2021     Calcium at desired goal.  We will obtain updated phosphorus with a.m. labs.     LOS: 0   12/22/20222:31 PM

## 2021-09-15 ENCOUNTER — Encounter (HOSPITAL_COMMUNITY): Payer: Self-pay

## 2021-09-15 ENCOUNTER — Inpatient Hospital Stay (HOSPITAL_COMMUNITY)
Admission: EM | Admit: 2021-09-15 | Discharge: 2021-09-19 | DRG: 640 | Disposition: A | Discharge status: Home / Self Care | Payer: Medicare Other | Attending: Internal Medicine | Admitting: Internal Medicine

## 2021-09-15 ENCOUNTER — Inpatient Hospital Stay (HOSPITAL_COMMUNITY): Payer: Medicare Other

## 2021-09-15 ENCOUNTER — Other Ambulatory Visit: Payer: Self-pay

## 2021-09-15 ENCOUNTER — Emergency Department (HOSPITAL_COMMUNITY): Payer: Medicare Other

## 2021-09-15 DIAGNOSIS — J439 Emphysema, unspecified: Secondary | ICD-10-CM | POA: Diagnosis present

## 2021-09-15 DIAGNOSIS — R7989 Other specified abnormal findings of blood chemistry: Secondary | ICD-10-CM

## 2021-09-15 DIAGNOSIS — I214 Non-ST elevation (NSTEMI) myocardial infarction: Secondary | ICD-10-CM

## 2021-09-15 DIAGNOSIS — Z79899 Other long term (current) drug therapy: Secondary | ICD-10-CM

## 2021-09-15 DIAGNOSIS — Z9115 Patient's noncompliance with renal dialysis: Secondary | ICD-10-CM

## 2021-09-15 DIAGNOSIS — I5043 Acute on chronic combined systolic (congestive) and diastolic (congestive) heart failure: Secondary | ICD-10-CM | POA: Diagnosis present

## 2021-09-15 DIAGNOSIS — R0609 Other forms of dyspnea: Secondary | ICD-10-CM

## 2021-09-15 DIAGNOSIS — E8770 Fluid overload, unspecified: Principal | ICD-10-CM | POA: Diagnosis present

## 2021-09-15 DIAGNOSIS — I340 Nonrheumatic mitral (valve) insufficiency: Secondary | ICD-10-CM | POA: Diagnosis present

## 2021-09-15 DIAGNOSIS — I272 Pulmonary hypertension, unspecified: Secondary | ICD-10-CM | POA: Diagnosis present

## 2021-09-15 DIAGNOSIS — N5089 Other specified disorders of the male genital organs: Secondary | ICD-10-CM | POA: Diagnosis not present

## 2021-09-15 DIAGNOSIS — I132 Hypertensive heart and chronic kidney disease with heart failure and with stage 5 chronic kidney disease, or end stage renal disease: Secondary | ICD-10-CM | POA: Diagnosis present

## 2021-09-15 DIAGNOSIS — Z992 Dependence on renal dialysis: Secondary | ICD-10-CM | POA: Diagnosis not present

## 2021-09-15 DIAGNOSIS — D631 Anemia in chronic kidney disease: Secondary | ICD-10-CM | POA: Diagnosis present

## 2021-09-15 DIAGNOSIS — I3139 Other pericardial effusion (noninflammatory): Secondary | ICD-10-CM | POA: Diagnosis present

## 2021-09-15 DIAGNOSIS — Z91199 Patient's noncompliance with other medical treatment and regimen due to unspecified reason: Secondary | ICD-10-CM

## 2021-09-15 DIAGNOSIS — Z833 Family history of diabetes mellitus: Secondary | ICD-10-CM | POA: Diagnosis not present

## 2021-09-15 DIAGNOSIS — Z20822 Contact with and (suspected) exposure to covid-19: Secondary | ICD-10-CM | POA: Diagnosis present

## 2021-09-15 DIAGNOSIS — Z9981 Dependence on supplemental oxygen: Secondary | ICD-10-CM | POA: Diagnosis not present

## 2021-09-15 DIAGNOSIS — J9610 Chronic respiratory failure, unspecified whether with hypoxia or hypercapnia: Secondary | ICD-10-CM | POA: Diagnosis present

## 2021-09-15 DIAGNOSIS — I493 Ventricular premature depolarization: Secondary | ICD-10-CM | POA: Diagnosis not present

## 2021-09-15 DIAGNOSIS — K219 Gastro-esophageal reflux disease without esophagitis: Secondary | ICD-10-CM | POA: Diagnosis present

## 2021-09-15 DIAGNOSIS — Z841 Family history of disorders of kidney and ureter: Secondary | ICD-10-CM | POA: Diagnosis not present

## 2021-09-15 DIAGNOSIS — R601 Generalized edema: Secondary | ICD-10-CM | POA: Diagnosis not present

## 2021-09-15 DIAGNOSIS — M898X9 Other specified disorders of bone, unspecified site: Secondary | ICD-10-CM | POA: Diagnosis present

## 2021-09-15 DIAGNOSIS — N186 End stage renal disease: Secondary | ICD-10-CM | POA: Diagnosis present

## 2021-09-15 DIAGNOSIS — I429 Cardiomyopathy, unspecified: Secondary | ICD-10-CM | POA: Diagnosis present

## 2021-09-15 DIAGNOSIS — F17210 Nicotine dependence, cigarettes, uncomplicated: Secondary | ICD-10-CM | POA: Diagnosis present

## 2021-09-15 DIAGNOSIS — T82898A Other specified complication of vascular prosthetic devices, implants and grafts, initial encounter: Secondary | ICD-10-CM | POA: Diagnosis not present

## 2021-09-15 DIAGNOSIS — I480 Paroxysmal atrial fibrillation: Secondary | ICD-10-CM | POA: Diagnosis present

## 2021-09-15 DIAGNOSIS — Z91119 Patient's noncompliance with dietary regimen due to unspecified reason: Secondary | ICD-10-CM

## 2021-09-15 DIAGNOSIS — L299 Pruritus, unspecified: Secondary | ICD-10-CM | POA: Diagnosis present

## 2021-09-15 DIAGNOSIS — R778 Other specified abnormalities of plasma proteins: Secondary | ICD-10-CM | POA: Diagnosis present

## 2021-09-15 DIAGNOSIS — Z888 Allergy status to other drugs, medicaments and biological substances status: Secondary | ICD-10-CM

## 2021-09-15 LAB — CBC WITH DIFFERENTIAL/PLATELET
Abs Immature Granulocytes: 0.36 10*3/uL — ABNORMAL HIGH (ref 0.00–0.07)
Basophils Absolute: 0.1 10*3/uL (ref 0.0–0.1)
Basophils Relative: 1 %
Eosinophils Absolute: 0.1 10*3/uL (ref 0.0–0.5)
Eosinophils Relative: 1 %
HCT: 28.5 % — ABNORMAL LOW (ref 39.0–52.0)
Hemoglobin: 8.6 g/dL — ABNORMAL LOW (ref 13.0–17.0)
Immature Granulocytes: 3 %
Lymphocytes Relative: 6 %
Lymphs Abs: 0.8 10*3/uL (ref 0.7–4.0)
MCH: 31.6 pg (ref 26.0–34.0)
MCHC: 30.2 g/dL (ref 30.0–36.0)
MCV: 104.8 fL — ABNORMAL HIGH (ref 80.0–100.0)
Monocytes Absolute: 1.5 10*3/uL — ABNORMAL HIGH (ref 0.1–1.0)
Monocytes Relative: 12 %
Neutro Abs: 9.5 10*3/uL — ABNORMAL HIGH (ref 1.7–7.7)
Neutrophils Relative %: 77 %
Platelets: 187 10*3/uL (ref 150–400)
RBC: 2.72 MIL/uL — ABNORMAL LOW (ref 4.22–5.81)
RDW: 16.2 % — ABNORMAL HIGH (ref 11.5–15.5)
WBC: 12.3 10*3/uL — ABNORMAL HIGH (ref 4.0–10.5)
nRBC: 0.9 % — ABNORMAL HIGH (ref 0.0–0.2)

## 2021-09-15 LAB — I-STAT CHEM 8, ED
BUN: 89 mg/dL — ABNORMAL HIGH (ref 6–20)
Calcium, Ion: 0.98 mmol/L — ABNORMAL LOW (ref 1.15–1.40)
Chloride: 104 mmol/L (ref 98–111)
Creatinine, Ser: 10.6 mg/dL — ABNORMAL HIGH (ref 0.61–1.24)
Glucose, Bld: 99 mg/dL (ref 70–99)
HCT: 26 % — ABNORMAL LOW (ref 39.0–52.0)
Hemoglobin: 8.8 g/dL — ABNORMAL LOW (ref 13.0–17.0)
Potassium: 5 mmol/L (ref 3.5–5.1)
Sodium: 139 mmol/L (ref 135–145)
TCO2: 23 mmol/L (ref 22–32)

## 2021-09-15 LAB — BASIC METABOLIC PANEL
Anion gap: 23 — ABNORMAL HIGH (ref 5–15)
BUN: 78 mg/dL — ABNORMAL HIGH (ref 6–20)
CO2: 18 mmol/L — ABNORMAL LOW (ref 22–32)
Calcium: 9.1 mg/dL (ref 8.9–10.3)
Chloride: 99 mmol/L (ref 98–111)
Creatinine, Ser: 10.61 mg/dL — ABNORMAL HIGH (ref 0.61–1.24)
GFR, Estimated: 5 mL/min — ABNORMAL LOW (ref >60)
Glucose, Bld: 96 mg/dL (ref 70–99)
Potassium: 5.1 mmol/L (ref 3.5–5.1)
Sodium: 140 mmol/L (ref 135–145)

## 2021-09-15 LAB — ECHOCARDIOGRAM COMPLETE
Height: 64 in
S' Lateral: 3.9 cm
Weight: 2560.86 oz

## 2021-09-15 LAB — RESP PANEL BY RT-PCR (FLU A&B, COVID) ARPGX2
Influenza A by PCR: NEGATIVE
Influenza B by PCR: NEGATIVE
SARS Coronavirus 2 by RT PCR: NEGATIVE

## 2021-09-15 LAB — LIPID PANEL
Cholesterol: 98 mg/dL (ref 0–200)
HDL: 30 mg/dL — ABNORMAL LOW (ref >40)
LDL Cholesterol: 54 mg/dL (ref 0–99)
Total CHOL/HDL Ratio: 3.3 RATIO
Triglycerides: 72 mg/dL (ref <150)
VLDL: 14 mg/dL (ref 0–40)

## 2021-09-15 LAB — TROPONIN I (HIGH SENSITIVITY)
Troponin I (High Sensitivity): 4714 ng/L (ref <18)
Troponin I (High Sensitivity): 3471 ng/L (ref <18)

## 2021-09-15 LAB — MRSA NEXT GEN BY PCR, NASAL: MRSA by PCR Next Gen: NOT DETECTED

## 2021-09-15 LAB — BRAIN NATRIURETIC PEPTIDE: B Natriuretic Peptide: 3856.3 pg/mL — ABNORMAL HIGH (ref 0.0–100.0)

## 2021-09-15 IMAGING — DX DG CHEST 1V PORT
1 series · 1 of 1 positions shown · non-contrast
Comparison: [DATE]

CLINICAL DATA: Shortness of breath.  Testicular swelling

EXAM:
PORTABLE CHEST 1 VIEW

[chest]
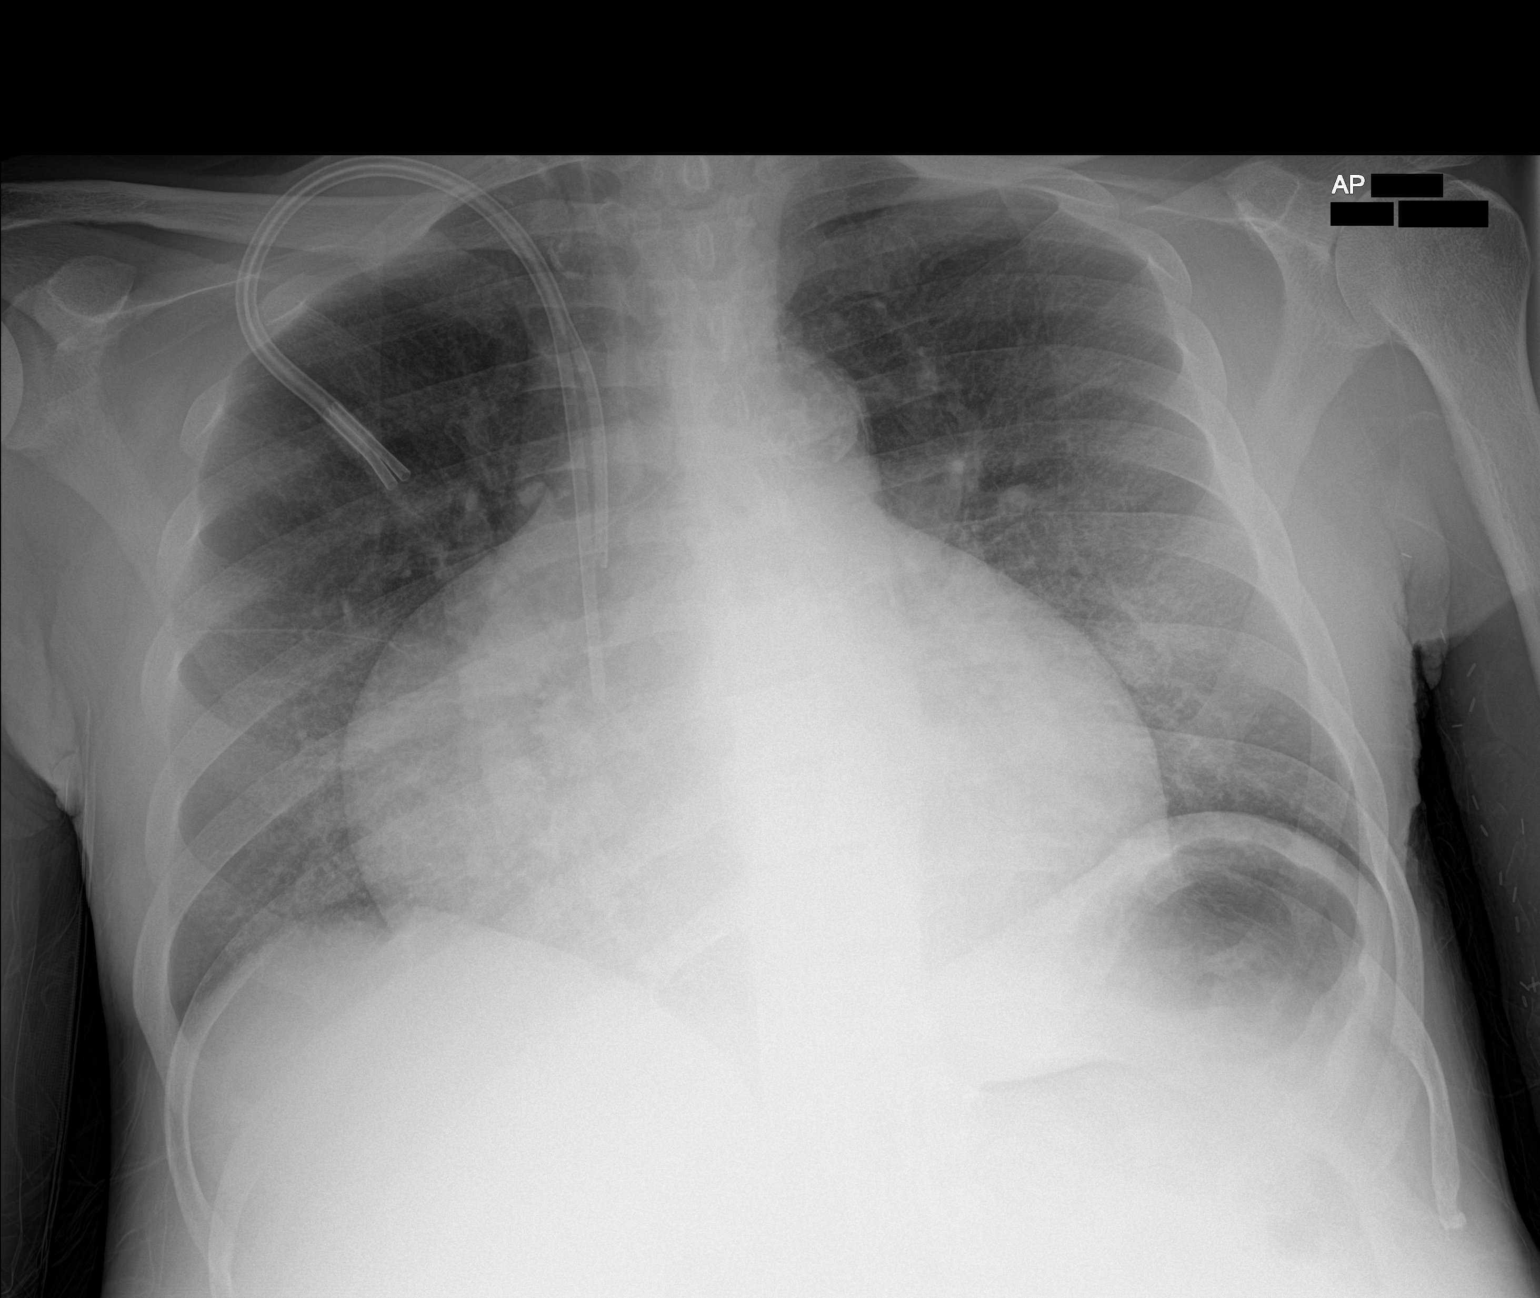

[1 of 1 positions shown; findings below may reference images not displayed]

FINDINGS: Right dialysis catheter remains in place, unchanged. Enlargement of
the cardiopericardial silhouette. Appearance suggest the possibility
of pericardial effusion. No confluent airspace opacities, effusions
or edema. No acute bony abnormality.
IMPRESSION: Enlargement of the cardiopericardial silhouette, similar to prior
study.

No acute cardiopulmonary disease.

## 2021-09-15 MED ORDER — FENTANYL CITRATE PF 50 MCG/ML IJ SOSY
50.0000 ug | PREFILLED_SYRINGE | Freq: Once | INTRAMUSCULAR | Status: AC
  Administered 2021-09-15: 10:00:00 50 ug via INTRAVENOUS
  Filled 2021-09-15: qty 1

## 2021-09-15 MED ORDER — SENNOSIDES-DOCUSATE SODIUM 8.6-50 MG PO TABS
1.0000 | ORAL_TABLET | Freq: Every evening | ORAL | Status: DC | PRN
  Administered 2021-09-19: 1 via ORAL
  Filled 2021-09-15: qty 1

## 2021-09-15 MED ORDER — CALCITRIOL 0.5 MCG PO CAPS: 1.5000 ug | ORAL_CAPSULE | ORAL | Status: DC

## 2021-09-15 MED ORDER — DARBEPOETIN ALFA 100 MCG/0.5ML IJ SOSY
100.0000 ug | PREFILLED_SYRINGE | INTRAMUSCULAR | Status: DC
  Filled 2021-09-15: qty 0.5

## 2021-09-15 MED ORDER — ALBUTEROL SULFATE (2.5 MG/3ML) 0.083% IN NEBU
2.5000 mg | INHALATION_SOLUTION | RESPIRATORY_TRACT | Status: DC | PRN
  Administered 2021-09-18: 2.5 mg via RESPIRATORY_TRACT
  Filled 2021-09-15 (×2): qty 3

## 2021-09-15 MED ORDER — ONDANSETRON HCL 4 MG PO TABS: 4.0000 mg | ORAL_TABLET | Freq: Four times a day (QID) | ORAL | Status: DC | PRN

## 2021-09-15 MED ORDER — CHLORHEXIDINE GLUCONATE CLOTH 2 % EX PADS
6.0000 | MEDICATED_PAD | Freq: Every day | CUTANEOUS | Status: DC
  Administered 2021-09-16 – 2021-09-19 (×4): 6 via TOPICAL

## 2021-09-15 MED ORDER — FERROUS SULFATE 325 (65 FE) MG PO TABS
325.0000 mg | ORAL_TABLET | Freq: Two times a day (BID) | ORAL | Status: DC
  Administered 2021-09-15 – 2021-09-19 (×8): 325 mg via ORAL
  Filled 2021-09-15 (×8): qty 1

## 2021-09-15 MED ORDER — ASPIRIN 81 MG PO CHEW
81.0000 mg | CHEWABLE_TABLET | Freq: Every day | ORAL | Status: DC
  Administered 2021-09-15 – 2021-09-19 (×6): 81 mg via ORAL
  Filled 2021-09-15 (×6): qty 1

## 2021-09-15 MED ORDER — ONDANSETRON HCL 4 MG/2ML IJ SOLN: 4.0000 mg | Freq: Four times a day (QID) | INTRAMUSCULAR | Status: DC | PRN

## 2021-09-15 MED ORDER — HYDROXYZINE HCL 25 MG PO TABS
25.0000 mg | ORAL_TABLET | Freq: Once | ORAL | Status: AC
  Administered 2021-09-15: 08:00:00 25 mg via ORAL
  Filled 2021-09-15: qty 1

## 2021-09-15 MED ORDER — ACETAMINOPHEN 650 MG RE SUPP: 650.0000 mg | Freq: Four times a day (QID) | RECTAL | Status: DC | PRN

## 2021-09-15 MED ORDER — CARVEDILOL 3.125 MG PO TABS
3.1250 mg | ORAL_TABLET | Freq: Two times a day (BID) | ORAL | Status: DC
  Administered 2021-09-15 – 2021-09-19 (×7): 3.125 mg via ORAL
  Filled 2021-09-15 (×7): qty 1

## 2021-09-15 MED ORDER — CALCIUM ACETATE (PHOS BINDER) 667 MG PO CAPS
1334.0000 mg | ORAL_CAPSULE | Freq: Three times a day (TID) | ORAL | Status: DC
  Administered 2021-09-15 – 2021-09-19 (×10): 1334 mg via ORAL
  Filled 2021-09-15 (×11): qty 2

## 2021-09-15 MED ORDER — ACETAMINOPHEN 325 MG PO TABS
650.0000 mg | ORAL_TABLET | Freq: Four times a day (QID) | ORAL | Status: DC | PRN
  Administered 2021-09-15 – 2021-09-18 (×2): 650 mg via ORAL
  Filled 2021-09-15 (×2): qty 2

## 2021-09-15 MED ORDER — HYDROXYZINE HCL 25 MG PO TABS
25.0000 mg | ORAL_TABLET | Freq: Three times a day (TID) | ORAL | Status: DC | PRN
  Administered 2021-09-15 – 2021-09-19 (×11): 25 mg via ORAL
  Filled 2021-09-15 (×14): qty 1

## 2021-09-15 MED ORDER — ROSUVASTATIN CALCIUM 5 MG PO TABS
10.0000 mg | ORAL_TABLET | Freq: Every day | ORAL | Status: DC
  Administered 2021-09-15 – 2021-09-19 (×6): 10 mg via ORAL
  Filled 2021-09-15 (×6): qty 2

## 2021-09-15 MED ORDER — CINACALCET HCL 30 MG PO TABS
30.0000 mg | ORAL_TABLET | Freq: Every day | ORAL | Status: DC
  Administered 2021-09-16 – 2021-09-19 (×4): 30 mg via ORAL
  Filled 2021-09-15 (×5): qty 1

## 2021-09-15 MED ORDER — HEPARIN SODIUM (PORCINE) 5000 UNIT/ML IJ SOLN
5000.0000 [IU] | Freq: Three times a day (TID) | INTRAMUSCULAR | Status: DC
  Administered 2021-09-15 – 2021-09-19 (×12): 5000 [IU] via SUBCUTANEOUS
  Filled 2021-09-15 (×11): qty 1

## 2021-09-15 MED ORDER — SODIUM CHLORIDE 0.9% FLUSH
3.0000 mL | Freq: Two times a day (BID) | INTRAVENOUS | Status: DC
  Administered 2021-09-15 – 2021-09-19 (×8): 3 mL via INTRAVENOUS

## 2021-09-15 MED ORDER — ALBUTEROL SULFATE HFA 108 (90 BASE) MCG/ACT IN AERS: 2.0000 | INHALATION_SPRAY | RESPIRATORY_TRACT | Status: DC | PRN

## 2021-09-15 NOTE — Consult Note (Addendum)
Cardiology Consultation:   Patient ID: Jordan [name] MRN: [id number]; DOB: 01/22/1966  Admit date: 09/15/2021 Date of Consult: 09/15/2021  PCP:  Hoy Register, MD   [facility] Providers Cardiologist:  None  Electrophysiologist:  Audrena Talaga Meredith Leeds, MD       Patient Profile:   Jordan [name] is a 55 y.o. male with a hx of ESRD on HD w/ compliance issues, HTN, hx ETOH abuse, who is being seen 09/15/2021 for the evaluation of elevated troponin at the request of Dr Gilford Raid.  History of Present Illness:   Jordan [name] has been to the hospital several times in December and late November.  Prior to that he had been hospitalized 4 times for Serratia bacteremia.  Each time, he had left AMA.  He was hospitalized 11/29-12/09/2020 for abdominal pain and distention after missing dialysis. Cr 12.97, K+ 6.2.  He was dialyzed twice before discharge.  Troponin 85  ER visit 12/12-08/2012, he had missed dialysis.  He was dialyzed developed chest pain during dialysis that was sharp. Troponin 89, ECG nonacute.  His chest pain resolved after dialysis and after he ate.  Admitted 12/19-12/20 with shortness of breath having missed dialysis, he was dialyzed and left AMA.  He was was hospitalized 12/22 for scrotal swelling, having missed HD, left AMA after dialysis.  At that time, his BNP was 4500, troponin 182.  He was hypotensive after dialysis, but refused to stay and left AMA.  He returned to the hospital early this a.m. still complaining of scrotal swelling and pain and itching.  He is also complaining of chest pain that is in the middle of his chest and goes around his right side.  This is at the lower sternal area.  He has chest wall tenderness in these areas.  He is not sure how long the pain has been going on.  It is a 10/10.  Creatinine 10.61, K+ 5.1, BNP 3856, troponin 4714.   Past Medical History:  Diagnosis Date   Anemia    Aortic atherosclerosis ([place]) 11/12/2019   ED  (erectile dysfunction)    Emphysema of lung ([place]) 11/12/2019   ESRD on hemodialysis ([place])    M/W/F in [place], [place]   ETOH abuse    History of blood transfusion    Hypertension    Wears dentures     Past Surgical History:  Procedure Laterality Date   A/V FISTULAGRAM Left 07/15/2021   Procedure: A/V FISTULAGRAM;  Surgeon: Algernon Huxley, MD;  Location: [place] [facility];  Service: Cardiovascular;  Laterality: Left;   BASCILIC VEIN TRANSPOSITION Left 08/07/2016   Procedure: LEFT BASILIC VEIN TRANSPOSITION;  Surgeon: Angelia Mould, MD;  Location: [place];  Service: Vascular;  Laterality: Left;   CLOSED REDUCTION NASAL FRACTURE N/A 08/11/2019   Procedure: CLOSED REDUCTION NASAL FRACTURE WITH STABILIZATION;  Surgeon: Irene Limbo, MD;  Location: [place];  Service: Plastics;  Laterality: N/A;   COLONOSCOPY N/A 08/12/2016   Procedure: COLONOSCOPY;  Surgeon: Otis Brace, MD;  Location: [place];  Service: Gastroenterology;  Laterality: N/A;   COLONOSCOPY WITH PROPOFOL N/A 06/05/2021   Procedure: COLONOSCOPY WITH PROPOFOL;  Surgeon: Sharyn Creamer, MD;  Location: [place];  Service: Gastroenterology;  Laterality: N/A;   DIALYSIS/PERMA CATHETER INSERTION N/A 07/26/2021   Procedure: DIALYSIS/PERMA CATHETER INSERTION;  Surgeon: Algernon Huxley, MD;  Location: [place] [facility];  Service: Cardiovascular;  Laterality: N/A;   ESOPHAGOGASTRODUODENOSCOPY N/A 08/12/2016   Procedure: ESOPHAGOGASTRODUODENOSCOPY (EGD);  Surgeon: Otis Brace, MD;  Location: [facility];  Service: Gastroenterology;  Laterality: N/A;   ESOPHAGOGASTRODUODENOSCOPY (EGD) WITH PROPOFOL N/A 06/05/2021   Procedure: ESOPHAGOGASTRODUODENOSCOPY (EGD) WITH PROPOFOL;  Surgeon: Sharyn Creamer, MD;  Location: [place];  Service: Gastroenterology;  Laterality: N/A;   INSERTION OF DIALYSIS CATHETER N/A 08/07/2016   Procedure: INSERTION OF TUNNELED DIALYSIS CATHETER;  Surgeon: Angelia Mould, MD;   Location: [place];  Service: Vascular;  Laterality: N/A;   LIGATION OF ARTERIOVENOUS  FISTULA Left 09/12/2016   Procedure: BANDING OF LEFT  ARTERIOVENOUS  FISTULA;  Surgeon: Angelia Mould, MD;  Location: [place];  Service: Vascular;  Laterality: Left;   LIGATION OF ARTERIOVENOUS  FISTULA Left 07/17/2021   Procedure: LIGATION OF ARTERIOVENOUS  FISTULA;  Surgeon: Katha Cabal, MD;  Location: [facility];  Service: Vascular;  Laterality: Left;  also include placement of temporary dialysis catheter right or left groin   LOWER EXTREMITY INTERVENTION Right 12/02/2018   Procedure: LOWER EXTREMITY INTERVENTION;  Surgeon: Algernon Huxley, MD;  Location: [place] [facility];  Service: Cardiovascular;  Laterality: Right;   POLYPECTOMY  06/05/2021   Procedure: POLYPECTOMY;  Surgeon: Sharyn Creamer, MD;  Location: [facility];  Service: Gastroenterology;;   TEMPORARY DIALYSIS CATHETER Left 07/17/2021   Procedure: TEMPORARY DIALYSIS CATHETER;  Surgeon: Katha Cabal, MD;  Location: [facility];  Service: Vascular;  Laterality: Left;     Home Medications:  Prior to Admission medications   Medication Sig Start Date End Date Taking? Authorizing Provider  calcium acetate (PHOSLO) 667 MG capsule Take 2 capsules (1,334 mg total) by mouth 3 (three) times daily with meals. 06/05/21  Yes Danford, Suann Larry, MD  epoetin alfa (EPOGEN) 4000 UNIT/ML injection Inject 1 mL (4,000 Units total) into the vein every Monday, Wednesday, and Friday with hemodialysis. 06/22/20  Yes Enzo Bi, MD  albuterol (VENTOLIN HFA) 108 (90 Base) MCG/ACT inhaler Inhale 2 puffs into the lungs every 4 (four) hours as needed for wheezing or shortness of breath. Patient not taking: Reported on 09/15/2021 06/05/21   Edwin Dada, MD  cinacalcet (SENSIPAR) 30 MG tablet Take 1 tablet (30 mg total) by mouth daily with breakfast. Patient not taking: Reported on 09/15/2021 06/05/21   Edwin Dada, MD  ferrous sulfate 325 (65  FE) MG tablet Take 1 tablet (325 mg total) by mouth 2 (two) times daily with a meal. Patient not taking: Reported on 09/15/2021 06/05/21   Edwin Dada, MD    Inpatient Medications: Scheduled Meds:  Continuous Infusions:  PRN Meds:   Allergies:    Allergies  Allergen Reactions   Lisinopril Swelling    Social History:   Social History   Socioeconomic History   Marital status: Single    Spouse name: Not on file   Number of children: Not on file   Years of education: Not on file   Highest education level: Not on file  Occupational History   Occupation: disabled  Tobacco Use   Smoking status: Every Day    Packs/day: 0.50    Years: 40.00    Pack years: 20.00    Types: Cigarettes   Smokeless tobacco: Never  Vaping Use   Vaping Use: Never used  Substance and Sexual Activity   Alcohol use: Not Currently    Alcohol/week: 21.0 standard drinks    Types: 21 Cans of beer per week    Comment: last drink 6 months ago   Drug use: No   Sexual activity: Not Currently  Other Topics  Concern   Not on file  Social History Narrative   Not on file   Social Determinants of Health   Financial Resource Strain: Not on file  Food Insecurity: Not on file  Transportation Needs: Not on file  Physical Activity: Not on file  Stress: Not on file  Social Connections: Not on file  Intimate Partner Violence: Not on file    Family History:   Family History  Problem Relation Age of Onset   Diabetes Mother    Kidney failure Mother    Healthy Father    Kidney failure Brother    Healthy Sister    Kidney disease Daughter    Post-traumatic stress disorder Neg Hx    Bladder Cancer Neg Hx    Kidney cancer Neg Hx      ROS:  Please see the history of present illness.  All other ROS reviewed and negative.     Physical Exam/Data:   Vitals:   09/15/21 0845 09/15/21 0846 09/15/21 0915 09/15/21 0948  BP: (!) 145/98  122/90   Pulse: 93  72   Resp: 14  (!) 22   Temp:    98.2 F  (36.8 C)  TempSrc:    Oral  SpO2: 99% (!) 2% 99%   Weight:      Height:       No intake or output data in the 24 hours ending 09/15/21 1022 Last 3 Weights 09/15/2021 09/12/2021 09/11/2021  Weight (lbs) 160 lb 0.9 oz 160 lb 0.9 oz 160 lb  Weight (kg) 72.6 kg 72.6 kg 72.576 kg  Some encounter information is confidential and restricted. Go to Review Flowsheets activity to see all data.     Body mass index is 27.47 kg/m.  General:  Well nourished, well developed, in  acute distress HEENT: normal Neck: JVD elevated, but patient is not at 30 degrees and does not want to move Vascular: No carotid bruits; Distal radial pulses 2+ bilaterally-pedal pulses difficult to palpate due to lower extremity edema Cardiac:  normal S1, S2; RRR; no murmur  Lungs: Coarse Rales to auscultation bilaterally, no wheezing, rhonchi  Abd: soft, nontender, no hepatomegaly  Ext: 1+ lower extremity edema, 2+ pedal edema Musculoskeletal:  No deformities, BUE and BLE strength normal and equal Skin: warm and dry  Neuro:  CNs 2-12 intact, no focal abnormalities noted Psych:  Normal affect   EKG:  The EKG was personally reviewed and demonstrates:  sinus tach, rate 114, no acute ischemic changes, variable P wave morphology, this was not seen on 12/22 ECG Telemetry:  Telemetry was personally reviewed and demonstrates: Sinus rhythm  Relevant CV Studies:  ECHO: 07/20/2021  1. Left ventricular ejection fraction, by estimation, is 45 to 50%. The  left ventricle has mildly decreased function. The left ventricle has no  regional wall motion abnormalities. There is mild concentric left  ventricular hypertrophy. Left ventricular diastolic parameters are consistent with Grade II diastolic dysfunction (pseudonormalization). Elevated left ventricular end-diastolic pressure.   2. Right ventricular systolic function is normal. The right ventricular  size is mildly enlarged. There is severely elevated pulmonary artery  systolic  pressure.   3. Left atrial size was severely dilated.   4. Right atrial size was severely dilated.   5. There is no evidence of cardiac tamponade.   6. The mitral valve is degenerative. Mild to moderate mitral valve  regurgitation. No evidence of mitral stenosis. Moderate mitral annular  calcification.   7. Tricuspid valve regurgitation is severe.  8. The aortic valve is tricuspid. There is mild calcification of the  aortic valve. There is mild thickening of the aortic valve. Aortic valve  regurgitation is not visualized. No aortic stenosis is present.   9. The inferior vena cava is dilated in size with >50% respiratory  variability, suggesting right atrial pressure of 8 mmHg.   Comparison(s): Compared with the echo 06/03/21, the pericardial effusion is larger. There is no tamponade.   Laboratory Data:  High Sensitivity Troponin:   Recent Labs  Lab 09/03/21 1322 09/09/21 1412 09/09/21 1641 09/09/21 2150 09/15/21 0650  TROPONINIHS 84* 182* 166* 160* 4,714*     Chemistry Recent Labs  Lab 09/10/21 0658 09/11/21 2028 09/15/21 0650 09/15/21 0914  NA 141 140 140 139  K 4.1 4.5 5.1 5.0  CL 101 100 99 104  CO2 25 24 18*  --   GLUCOSE 130* 94 96 99  BUN 64* 80* 78* 89*  CREATININE 9.21* 10.84* 10.61* 10.60*  CALCIUM 9.2 9.3 9.1  --   GFRNONAA 6* 5* 5*  --   ANIONGAP 15 16* 23*  --     Recent Labs  Lab 09/09/21 1412 09/11/21 2028  PROT 8.9* 7.8  ALBUMIN 3.7 3.4*  AST 23 26  ALT 11 15  ALKPHOS 121 120  BILITOT 1.3* 1.0   Lipids  Recent Labs  Lab 09/10/21 0658  CHOL 114  TRIG 51  HDL 49  LDLCALC 55  CHOLHDL 2.3    Hematology Recent Labs  Lab 09/09/21 1412 09/11/21 2028 09/15/21 0650 09/15/21 0914  WBC 9.0 7.2 12.3*  --   RBC 2.37* 2.31* 2.72*  --   HGB 7.5* 7.4* 8.6* 8.8*  HCT 24.3* 23.8* 28.5* 26.0*  MCV 102.5* 103.0* 104.8*  --   MCH 31.6 32.0 31.6  --   MCHC 30.9 31.1 30.2  --   RDW 17.3* 16.7* 16.2*  --   PLT 143* 134* 187  --    BNP Recent  Labs  Lab 09/09/21 1412 09/11/21 2028 09/15/21 0650  BNP >4,500.0* >4,500.0* 2,035.5*    DDimer No results for input(s): DDIMER in the last 168 hours. Lab Results  Component Value Date   CHOL 114 09/10/2021   HDL 49 09/10/2021   LDLCALC 55 09/10/2021   TRIG 51 09/10/2021   CHOLHDL 2.3 09/10/2021   Lab Results  Component Value Date   TSH 1.420 05/29/2021   Lab Results  Component Value Date   HGBA1C 5.0 04/07/2020   Drugs of Abuse  No results found for: LABOPIA, COCAINSCRNUR, LABBENZ, AMPHETMU, THCU, LABBARB    Radiology/Studies:  DG Chest 1 View  Result Date: 09/12/2021 CLINICAL DATA:  Initial evaluation for pulmonary edema. EXAM: CHEST  1 VIEW COMPARISON:  Prior radiograph from 09/09/2021. FINDINGS: Dual-lumen right IJ approach central venous catheter remains in place with tips overlying the cavoatrial junction. Moderate to advanced cardiomegaly with globular contour of the cardiac silhouette, similar to previous, and likely reflecting persistent pericardial effusion. Mediastinal silhouette normal. Aortic atherosclerosis. Lungs mildly hypoinflated. Diffuse vascular and interstitial prominence consistent with mild diffuse pulmonary interstitial edema. Overall appearance is improved as compared to 09/09/2021. No visible pleural effusion. No focal infiltrates. No pneumothorax. Osseous structures are unchanged. IMPRESSION: 1. Mild diffuse pulmonary interstitial edema, mildly improved as compared to 09/09/2021. 2. Cardiomegaly with persistent globular contour of the cardiac silhouette, likely reflecting persistent pericardial effusion as previously seen. 3.  Aortic Atherosclerosis (ICD10-I70.0). Electronically Signed   By: Jeannine Boga M.D.   On: 09/12/2021  02:48   DG Chest Portable 1 View  Result Date: 09/15/2021 CLINICAL DATA:  Shortness of breath.  Testicular swelling EXAM: PORTABLE CHEST 1 VIEW COMPARISON:  09/12/2021 FINDINGS: Right dialysis catheter remains in place,  unchanged. Enlargement of the cardiopericardial silhouette. Appearance suggest the possibility of pericardial effusion. No confluent airspace opacities, effusions or edema. No acute bony abnormality. IMPRESSION: Enlargement of the cardiopericardial silhouette, similar to prior study. No acute cardiopulmonary disease. Electronically Signed   By: Rolm Baptise M.D.   On: 09/15/2021 06:56   US SCROTUM W/DOPPLER  Result Date: 09/11/2021 CLINICAL DATA:  Scrotal swelling for 1 day EXAM: SCROTAL ULTRASOUND DOPPLER ULTRASOUND OF THE TESTICLES TECHNIQUE: Complete ultrasound examination of the testicles, epididymis, and other scrotal structures was performed. Color and spectral Doppler ultrasound were also utilized to evaluate blood flow to the testicles. COMPARISON:  None. FINDINGS: Right testicle Measurements: 3.4 x 2.8 x 3.2 cm. No mass or microlithiasis visualized. Left testicle Measurements: 3.5 x 2.8 x 2.7 cm. No mass or microlithiasis visualized. Right epididymis:  Normal in size and appearance. Left epididymis:  Normal in size and appearance. Hydrocele:  Small bilateral hydroceles are noted. Varicocele:  None visualized. Pulsed Doppler interrogation of both testes demonstrates normal low resistance arterial and venous waveforms bilaterally. Note is made of diffuse scrotal edema. IMPRESSION: Normal-appearing testicles. Diffuse scrotal edema Is noted. Electronically Signed   By: Inez Catalina M.D.   On: 09/11/2021 22:53     Assessment and Plan:   NSTEMI -It is unclear if this is demand ischemia versus true NSTEMI. - His chest pain is atypical and his ECG is without ischemic changes - Stat echo has been ordered because of his history of a pericardial effusion. -The complexes on his ECG have decreased amplitude, but that has been there for a while, in fact, the 08/31 ECG complexes were even smaller - he Olson Lucarelli need an ischemic eval at some point, prior to d/c - a final decision on the type of eval (MV, CT,  cath), can be made once the pt's volume status is improved and echo reviewed. - w/ hx med/HD noncompliance, would be reluctant to do cath/PCI  2. Arrhythmia - ECGs reviewed w/ Dr Curt Bears - NO Afib seen - he has runs of PACs and occ PVCs, influenced by electrolyte imbalances caused by missing HD.   Otherwise, per IM, Neprhology    Risk Assessment/Risk Scores:     TIMI Risk Score for Unstable Angina or Non-ST Elevation MI:   The patient's TIMI risk score is 2, which indicates a 8% risk of all cause mortality, new or recurrent myocardial infarction or need for urgent revascularization in the next 14 days.   For questions or updates, please contact [place] Please consult [email or web address] for contact info under    Signed, Rosaria Ferries, PA-C  09/15/2021 10:22 AM  I have seen and examined this patient with Rosaria Ferries.  Agree with above, note added to reflect my findings.  Patient presented to the emergency room with scrotal swelling.  Troponin was checked and was found to be elevated.  Patient endorses chest pain, but this is reproducible to palpation.  He does state that he has been having chest pain on and off, though he is a poor historian.  He also states that he has been short of breath.  It appears he has missed multiple sessions of dialysis.  GEN: Well nourished, well developed, in no acute distress  HEENT: normal  Neck: no JVD, carotid bruits, or masses  Cardiac: RRR; no murmurs, rubs, or gallops,no edema  Respiratory:  clear to auscultation bilaterally, normal work of breathing GI: soft, nontender, nondistended, + BS MS: no deformity or atrophy  Skin: warm and dry Neuro:  Strength and sensation are intact Psych: euthymic mood, full affect   Elevated troponin: Could potentially be due to demand ischemia with his multiple missed dialysis sessions.  He does have a history of pericardial effusion.  We Emrick Hensch plan for transthoracic echo.  He Kemari Mares likely need an ischemic  eval at some point prior to discharge, possibly Myoview versus CT.  Would hold off on anticoagulation at this time unless troponin continues to rise. PACs: Noted on telemetry.  No evidence of atrial fibrillation.  We Harlen Danford continue with current management.  Ruger Saxer M. Kathe Wirick MD 09/15/2021 10:42 AM

## 2021-09-15 NOTE — ED Provider Notes (Signed)
[facility] EMERGENCY DEPARTMENT Provider Note   CSN: [id number] Arrival date & time: 09/15/21  0548     History Chief Complaint  Patient presents with   Groin Swelling    Jordan [name] is a 55 y.o. male who presents with edema of the genitals.  He has a past medical history of alcohol abuse, COPD with chronic respiratory failure on 2 L at baseline, hypertension end-stage renal disease on hemodialysis Monday Wednesday and Friday.  The patient's last dialysis session was 4 days ago.  Patient was unable to dialyze this past Friday due to transportation issues.  Patient complains of severe swelling to his scrotum and penis.  He states that he is able to make urine but is unable to urinate at this time.  He denies any shortness of breath, chest pain or swelling elsewhere.  Patient was admitted to the hospital for the same condition on 12/22.  Although the patient reports that he got dialysis on the 21st review of EMR shows that the patient missed his session that day.  It appears he did have dialysis on the 22nd while hospitalized but then left AMA.  Patient denies a history of scrotal swelling however review of EMR shows that he has a history of scrotal swelling.  He denies fevers, chills.  HPI     Past Medical History:  Diagnosis Date   Anemia    Aortic atherosclerosis ([place]) 11/12/2019   ED (erectile dysfunction)    Emphysema of lung ([place]) 11/12/2019   ESRD on hemodialysis ([place])    M/W/F in [place], [place]   ETOH abuse    History of blood transfusion    Hypertension    Wears dentures     Patient Active Problem List   Diagnosis Date Noted   Scrotal swelling 09/12/2021   Scabies 08/11/2021   Cellulitis, wound, post-operative 08/10/2021   Surgical wound infection 08/07/2021   Infection due to Serratia marcescens 08/07/2021   Wound infection after surgery 08/07/2021   CAP (community acquired pneumonia) 07/30/2021   ABLA (acute blood loss anemia) 07/17/2021    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 ([place]) 07/17/2021   AV fistula infection, 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/17/2021   AV fistula thrombosis, 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/17/2021   Pressure injury of skin 07/15/2021   Acute on chronic respiratory failure with hypoxemia ([place]) 07/12/2021   Ascites    Protein-calorie malnutrition, severe 06/05/2021   Hypervolemia associated with renal insufficiency 06/02/2021   Symptomatic anemia 05/28/2021   Acute on chronic diastolic CHF (congestive heart failure) ([place]) 05/28/2021   Hyperkalemia 05/28/2021   Pulmonary edema 04/29/2021   Pruritus 04/29/2021   COVID-19 virus infection 04/29/2021   Thrombocytopenia ([place]) 04/08/2021   COPD (chronic obstructive pulmonary disease) ([place]) 04/08/2021   CHF (congestive heart failure) ([place]) 03/25/2021   PAF (paroxysmal atrial fibrillation) ([place]) 03/20/2021   Chronic diastolic CHF (congestive heart failure) ([place]) 03/20/2021   Alcohol abuse 03/20/2021   Vision loss of left eye 03/20/2021   SOB (shortness of breath) 03/20/2021   Shortness of breath 03/10/2021   Abscess of buttock 03/02/2021   Fluid overload 03/02/2021   Multifocal pneumonia 01/13/2021   Abdominal pain 06/19/2020   Current mild episode of major depressive disorder without prior episode ([place]) 04/09/2020   Atrial fibrillation with RVR ([place]) 04/04/2020   Non-compliance with renal dialysis ([place]) 03/21/2020   Acute CHF (congestive heart failure) ([place]) 03/12/2020   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 ([place]) 02/03/2020   Acute on chronic  combined systolic and diastolic CHF (congestive heart failure) ([place]) 02/03/2020   Acute on chronic respiratory failure with hypoxia ([place]) 11/12/2019   Aortic atherosclerosis ([place]) 11/12/2019   Emphysema of lung ([place]) 11/12/2019   Volume overload 10/05/2019   Elevated troponin 05/17/2018   Anemia in ESRD (end-stage renal disease) ([place]) 05/17/2018   ESRD on dialysis ([place]) 05/26/2017    Essential hypertension 05/26/2017   Moderate protein-calorie malnutrition ([place]) 08/13/2016   Secondary hyperparathyroidism of renal origin ([place]) 08/13/2016   Tobacco abuse 08/04/2016   Hypertensive emergency     Past Surgical History:  Procedure Laterality Date   A/V FISTULAGRAM Left 07/15/2021   Procedure: A/V FISTULAGRAM;  Surgeon: Algernon Huxley, MD;  Location: [facility];  Service: Cardiovascular;  Laterality: Left;   BASCILIC VEIN TRANSPOSITION Left 08/07/2016   Procedure: LEFT BASILIC VEIN TRANSPOSITION;  Surgeon: Angelia Mould, MD;  Location: [facility];  Service: Vascular;  Laterality: Left;   CLOSED REDUCTION NASAL FRACTURE N/A 08/11/2019   Procedure: CLOSED REDUCTION NASAL FRACTURE WITH STABILIZATION;  Surgeon: Irene Limbo, MD;  Location: [facility];  Service: Plastics;  Laterality: N/A;   COLONOSCOPY N/A 08/12/2016   Procedure: COLONOSCOPY;  Surgeon: Otis Brace, MD;  Location: [facility];  Service: Gastroenterology;  Laterality: N/A;   COLONOSCOPY WITH PROPOFOL N/A 06/05/2021   Procedure: COLONOSCOPY WITH PROPOFOL;  Surgeon: Sharyn Creamer, MD;  Location: [facility];  Service: Gastroenterology;  Laterality: N/A;   DIALYSIS/PERMA CATHETER INSERTION N/A 07/26/2021   Procedure: DIALYSIS/PERMA CATHETER INSERTION;  Surgeon: Algernon Huxley, MD;  Location: [facility];  Service: Cardiovascular;  Laterality: N/A;   ESOPHAGOGASTRODUODENOSCOPY N/A 08/12/2016   Procedure: ESOPHAGOGASTRODUODENOSCOPY (EGD);  Surgeon: Otis Brace, MD;  Location: [facility];  Service: Gastroenterology;  Laterality: N/A;   ESOPHAGOGASTRODUODENOSCOPY (EGD) WITH PROPOFOL N/A 06/05/2021   Procedure: ESOPHAGOGASTRODUODENOSCOPY (EGD) WITH PROPOFOL;  Surgeon: Sharyn Creamer, MD;  Location: [facility];  Service: Gastroenterology;  Laterality: N/A;   INSERTION OF DIALYSIS CATHETER N/A 08/07/2016   Procedure: INSERTION OF TUNNELED DIALYSIS CATHETER;  Surgeon: Angelia Mould, MD;   Location: [facility];  Service: Vascular;  Laterality: N/A;   LIGATION OF ARTERIOVENOUS  FISTULA Left 09/12/2016   Procedure: BANDING OF LEFT  ARTERIOVENOUS  FISTULA;  Surgeon: Angelia Mould, MD;  Location: [facility];  Service: Vascular;  Laterality: Left;   LIGATION OF ARTERIOVENOUS  FISTULA Left 07/17/2021   Procedure: LIGATION OF ARTERIOVENOUS  FISTULA;  Surgeon: Katha Cabal, MD;  Location: [facility];  Service: Vascular;  Laterality: Left;  also include placement of temporary dialysis catheter right or left groin   LOWER EXTREMITY INTERVENTION Right 12/02/2018   Procedure: LOWER EXTREMITY INTERVENTION;  Surgeon: Algernon Huxley, MD;  Location: [facility];  Service: Cardiovascular;  Laterality: Right;   POLYPECTOMY  06/05/2021   Procedure: POLYPECTOMY;  Surgeon: Sharyn Creamer, MD;  Location[facility];  Service: Gastroenterology;;   TEMPORARY DIALYSIS CATHETER Left 07/17/2021   Procedure: TEMPORARY DIALYSIS CATHETER;  Surgeon: Katha Cabal, MD;  Location: [facility];  Service: Vascular;  Laterality: Left;       Family History  Problem Relation Age of Onset   Diabetes Mother    Kidney failure Mother    Healthy Father    Kidney failure Brother    Healthy Sister    Kidney disease Daughter    Post-traumatic stress disorder Neg Hx    Bladder Cancer Neg Hx    Kidney cancer Neg Hx  Social History   Tobacco Use   Smoking status: Every Day    Packs/day: 0.50    Years: 40.00    Pack years: 20.00    Types: Cigarettes   Smokeless tobacco: Never  Vaping Use   Vaping Use: Never used  Substance Use Topics   Alcohol use: Not Currently    Alcohol/week: 21.0 standard drinks    Types: 21 Cans of beer per week    Comment: last drink 6 months ago   Drug use: No    Home Medications Prior to Admission medications   Medication Sig Start Date End Date Taking? Authorizing Provider  albuterol (VENTOLIN HFA) 108 (90 Base) MCG/ACT inhaler Inhale 2 puffs into the lungs  every 4 (four) hours as needed for wheezing or shortness of breath. 06/05/21   Danford, Suann Larry, MD  calcium acetate (PHOSLO) 667 MG capsule Take 2 capsules (1,334 mg total) by mouth 3 (three) times daily with meals. 06/05/21   Danford, Suann Larry, MD  cinacalcet (SENSIPAR) 30 MG tablet Take 1 tablet (30 mg total) by mouth daily with breakfast. 06/05/21   Danford, Suann Larry, MD  epoetin alfa (EPOGEN) 4000 UNIT/ML injection Inject 1 mL (4,000 Units total) into the vein every Monday, Wednesday, and Friday with hemodialysis. 06/22/20   Enzo Bi, MD  ferrous sulfate 325 (65 FE) MG tablet Take 1 tablet (325 mg total) by mouth 2 (two) times daily with a meal. 06/05/21   Danford, Suann Larry, MD    Allergies    Lisinopril  Review of Systems   Review of Systems Ten systems reviewed and are negative for acute change, except as noted in the HPI.   Physical Exam Updated Vital Signs BP (!) 130/101 (BP Location: Right Arm)    Pulse 77    Temp 98.5 F (36.9 C) (Oral)    Resp (!) 22    Ht 5\' 4"  (1.626 m)    Wt 72.6 kg    SpO2 92%    BMI 27.47 kg/m   Physical Exam Vitals and nursing note reviewed. Exam conducted with a chaperone present Addison Naegeli).  Constitutional:      General: He is not in acute distress.    Appearance: He is well-developed. He is not diaphoretic.  HENT:     Head: Normocephalic and atraumatic.  Eyes:     General: No scleral icterus.    Conjunctiva/sclera: Conjunctivae normal.  Cardiovascular:     Rate and Rhythm: Normal rate and regular rhythm.     Heart sounds: Normal heart sounds.     Comments: Pitting edema in the legs BL, venous distension across the patient's forehead Pulmonary:     Effort: Pulmonary effort is normal. No respiratory distress.     Breath sounds: Normal breath sounds.  Chest:     Comments: Tunneled dialysis catheter present in the Left subclavian Abdominal:     Palpations: Abdomen is soft.     Tenderness: There is no abdominal  tenderness.  Genitourinary:    Comments: Massive scrotal and Penial edema. Minimal tenderness. Unable to palpate testicles due to amount of swelling.  Unable to retract redundant, swollen skin on the penis to assist the glans.  No erythema or signs of cellulitis. Musculoskeletal:     Cervical back: Normal range of motion and neck supple.  Skin:    General: Skin is warm and dry.  Neurological:     Mental Status: He is alert.  Psychiatric:        Behavior: Behavior  normal.    ED Results / Procedures / Treatments   Labs (all labs ordered are listed, but only abnormal results are displayed) Labs Reviewed  CBC WITH DIFFERENTIAL/PLATELET  BASIC METABOLIC PANEL  BRAIN NATRIURETIC PEPTIDE  TROPONIN I (HIGH SENSITIVITY)    EKG None  Radiology DG Chest Portable 1 View  Result Date: 09/15/2021 CLINICAL DATA:  Shortness of breath.  Testicular swelling EXAM: PORTABLE CHEST 1 VIEW COMPARISON:  09/12/2021 FINDINGS: Right dialysis catheter remains in place, unchanged. Enlargement of the cardiopericardial silhouette. Appearance suggest the possibility of pericardial effusion. No confluent airspace opacities, effusions or edema. No acute bony abnormality. IMPRESSION: Enlargement of the cardiopericardial silhouette, similar to prior study. No acute cardiopulmonary disease. Electronically Signed   By: Rolm Baptise M.D.   On: 09/15/2021 06:56    Procedures Procedures   Medications Ordered in ED Medications - No data to display  ED Course  I have reviewed the triage vital signs and the nursing notes.  Pertinent labs & imaging results that were available during my care of the patient were reviewed by me and considered in my medical decision making (see chart for details).  Clinical Course as of 09/15/21 0950  Sun Sep 15, 2021  0715 EKG shows sinus tachycardia at a rate of 104 with prolonged QT interval [AH]  0854 Patient with Markedly elevated Troponins.  He denies any chest pain, nausea or  shortness of breath.  Patient's BMP is pending which gives me concern for potential hyperkalemia.  I have ordered a stat Chem-8 and a repeat EKG. [AH]  4709 I performed an informal bedside ultrasound.  There is a trace pericardial effusion without evidence of any cardiac compromise.  Patient continues to deny chest pain.  I consulted with cardiology who will see the patient. [AH]    Clinical Course User Index [AH] Margarita Mail, PA-C   MDM Rules/Calculators/A&P                         GG:[id number] swelling VS:  Vitals:   09/15/21 0845 09/15/21 0846 09/15/21 0915 09/15/21 0948  BP: (!) 145/98  122/90   Pulse: 93  72   Resp: 14  (!) 22   Temp:    98.2 F (36.8 C)  TempSrc:    Oral  SpO2: 99% (!) 2% 99%   Weight:      Height:        ML:YYTKPTW is gathered by [employer] and patient. Previous records obtained and reviewed. DDX:The patient's complaint of genital swelling involves an extensive number of diagnostic and treatment options, and is a complaint that carries with it a high risk of complications, morbidity, and potential mortality. Given the large differential diagnosis, medical decision making is of high complexity.  Given the patient's recent admission for the same thing and recent scrotal ultrasound which showed no evidence of acute abnormality except for edema this is most likely due to volume overload in the setting of end-stage renal disease and noncompliance with hemodialysis.  Patient does not appear to have Fournier's gangrene, cellulitis.  I have extremely low suspicion for epididymitis, testicular torsion, orchitis. Labs: I ordered reviewed and interpreted labs which include  Imaging: I ordered and reviewed images which included 1 view chest x-ray. I independently visualized and interpreted all imaging.There are no acute, significant findings on today's images.  I performed an informal bedside ultrasound to evaluate for potential pericardial effusion.  Patient has a trace  pericardial effusion but no evidence of  significant cardiac compromise EKG: EKG shows prolonged QT as above Consults: Case discussed with cardiology and nephrology.  Patient will be admitted to the hospitalist service.  Discussed with Dr. Candiss Norse MDM: Patient here with significant scrotal swelling And volume overload.  He has elevated troponins of unknown etiology.  Patient will be dialyzed by nephrology likely tomorrow due to caseload. Patient disposition:The patient appears reasonably stabilized for admission considering the current resources, flow, and capabilities available in the ED at this time, and I doubt any other [facility] requiring further screening and/or treatment in the ED prior to admission.        Final Clinical Impression(s) / ED Diagnoses Final diagnoses:  None    Rx / DC Orders ED Discharge Orders     None        Margarita Mail, PA-C 09/15/21 1543    Isla Pence, MD 09/25/21 [phone]

## 2021-09-15 NOTE — ED Notes (Signed)
Pt was provided supplies to take a bed bath. New linens, gown, and socks changed. Three warm blankets provided.

## 2021-09-15 NOTE — ED Triage Notes (Signed)
Pt brought to ED by [facility] with c/o testicular swelling x 2 days. Pt is hemodialysis pt  MWF. States last hemodialysis treatment was Monday. Also endorses SOB.

## 2021-09-15 NOTE — ED Notes (Signed)
Assisted pt with elevating scrotum on pillow. Pt scrotum is swollen and approximately the size of a grapefruit.

## 2021-09-15 NOTE — ED Notes (Signed)
Site cleaned and dressing reapplied with biopatch over dialysis site.

## 2021-09-15 NOTE — ED Notes (Signed)
ED hx pt states he does not have COPD or HTN

## 2021-09-15 NOTE — Progress Notes (Signed)
°  Echocardiogram 2D Echocardiogram has been performed.  Jordan [name] 09/15/2021, 10:44 AM

## 2021-09-15 NOTE — ED Notes (Signed)
Pt ate his full lunch tray.

## 2021-09-15 NOTE — ED Notes (Signed)
Pt stated he still is itching and 10/10 pain. Notified EDP

## 2021-09-15 NOTE — Consult Note (Signed)
[facility] Renal Consultation Note  Requesting MD:  Lala Lund, MD Indication for Consultation:  ESRD and overload  Chief complaint: scrotal swelling   HPI: Jordan [name] is a 55 y.o. male with a history of ESRD, hypertension, alcohol abuse who presented to the hospital with significant scrotal swelling.  He was recently admitted to [facility] for the same and had signed out AMA within the past few days.  He was noted to have massive scrotal edema.  Nephrology is consulted for assistance with management.  He is at [facility] but is noncompliant per charting.  He has left AMA multiple times in the past.  He tells me that his left UE AVF was taken out due to infection and that he has been using a tunneled catheter.  Per outside vascular charting s/p graft excision LUE due to infection. Hx of wound dehiscence to that wound.   PMHx:   Past Medical History:  Diagnosis Date   Anemia    Aortic atherosclerosis ([place]) 11/12/2019   ED (erectile dysfunction)    Emphysema of lung ([place]) 11/12/2019   ESRD on hemodialysis ([place])    M/W/F in [place], [place]   ETOH abuse    History of blood transfusion    Hypertension    Wears dentures     Past Surgical History:  Procedure Laterality Date   A/V FISTULAGRAM Left 07/15/2021   Procedure: A/V FISTULAGRAM;  Surgeon: Algernon Huxley, MD;  Location: [facility];  Service: Cardiovascular;  Laterality: Left;   BASCILIC VEIN TRANSPOSITION Left 08/07/2016   Procedure: LEFT BASILIC VEIN TRANSPOSITION;  Surgeon: Angelia Mould, MD;  Location: [facility];  Service: Vascular;  Laterality: Left;   CLOSED REDUCTION NASAL FRACTURE N/A 08/11/2019   Procedure: CLOSED REDUCTION NASAL FRACTURE WITH STABILIZATION;  Surgeon: Irene Limbo, MD;  Location: [facility];  Service: Plastics;  Laterality: N/A;   COLONOSCOPY N/A 08/12/2016   Procedure: COLONOSCOPY;  Surgeon: Otis Brace, MD;  Location: [facility];  Service: Gastroenterology;   Laterality: N/A;   COLONOSCOPY WITH PROPOFOL N/A 06/05/2021   Procedure: COLONOSCOPY WITH PROPOFOL;  Surgeon: Sharyn Creamer, MD;  Location: [facility];  Service: Gastroenterology;  Laterality: N/A;   DIALYSIS/PERMA CATHETER INSERTION N/A 07/26/2021   Procedure: DIALYSIS/PERMA CATHETER INSERTION;  Surgeon: Algernon Huxley, MD;  Location: [place] [facility];  Service: Cardiovascular;  Laterality: N/A;   ESOPHAGOGASTRODUODENOSCOPY N/A 08/12/2016   Procedure: ESOPHAGOGASTRODUODENOSCOPY (EGD);  Surgeon: Otis Brace, MD;  Location: [facility];  Service: Gastroenterology;  Laterality: N/A;   ESOPHAGOGASTRODUODENOSCOPY (EGD) WITH PROPOFOL N/A 06/05/2021   Procedure: ESOPHAGOGASTRODUODENOSCOPY (EGD) WITH PROPOFOL;  Surgeon: Sharyn Creamer, MD;  Location: [place];  Service: Gastroenterology;  Laterality: N/A;   INSERTION OF DIALYSIS CATHETER N/A 08/07/2016   Procedure: INSERTION OF TUNNELED DIALYSIS CATHETER;  Surgeon: Angelia Mould, MD;  Location: [facility];  Service: Vascular;  Laterality: N/A;   LIGATION OF ARTERIOVENOUS  FISTULA Left 09/12/2016   Procedure: BANDING OF LEFT  ARTERIOVENOUS  FISTULA;  Surgeon: Angelia Mould, MD;  Location: [place];  Service: Vascular;  Laterality: Left;   LIGATION OF ARTERIOVENOUS  FISTULA Left 07/17/2021   Procedure: LIGATION OF ARTERIOVENOUS  FISTULA;  Surgeon: Katha Cabal, MD;  Location: [facility];  Service: Vascular;  Laterality: Left;  also include placement of temporary dialysis catheter right or left groin   LOWER EXTREMITY INTERVENTION Right 12/02/2018   Procedure: LOWER EXTREMITY INTERVENTION;  Surgeon: Algernon Huxley, MD;  Location: [facility] [facility] CV  LAB;  Service: Cardiovascular;  Laterality: Right;   POLYPECTOMY  06/05/2021   Procedure: POLYPECTOMY;  Surgeon: Sharyn Creamer, MD;  [facility];  Service: Gastroenterology;;   TEMPORARY DIALYSIS CATHETER Left 07/17/2021   Procedure: TEMPORARY DIALYSIS CATHETER;  Surgeon: Katha Cabal, MD;  Location: [facility];  Service: Vascular;  Laterality: Left;    Family Hx:  Family History  Problem Relation Age of Onset   Diabetes Mother    Kidney failure Mother    Healthy Father    Kidney failure Brother    Healthy Sister    Kidney disease Daughter    Post-traumatic stress disorder Neg Hx    Bladder Cancer Neg Hx    Kidney cancer Neg Hx     Social History:  reports that he has been smoking cigarettes. He has a 20.00 pack-year smoking history. He has never used smokeless tobacco. He reports that he does not currently use alcohol after a past usage of about 21.0 standard drinks per week. He reports that he does not use drugs.  Allergies:  Allergies  Allergen Reactions   Lisinopril Swelling    Medications: Prior to Admission medications   Medication Sig Start Date End Date Taking? Authorizing Provider  calcium acetate (PHOSLO) 667 MG capsule Take 2 capsules (1,334 mg total) by mouth 3 (three) times daily with meals. 06/05/21  Yes Danford, Suann Larry, MD  epoetin alfa (EPOGEN) 4000 UNIT/ML injection Inject 1 mL (4,000 Units total) into the vein every Monday, Wednesday, and Friday with hemodialysis. 06/22/20  Yes Enzo Bi, MD  albuterol (VENTOLIN HFA) 108 (90 Base) MCG/ACT inhaler Inhale 2 puffs into the lungs every 4 (four) hours as needed for wheezing or shortness of breath. Patient not taking: Reported on 09/15/2021 06/05/21   Edwin Dada, MD  cinacalcet (SENSIPAR) 30 MG tablet Take 1 tablet (30 mg total) by mouth daily with breakfast. Patient not taking: Reported on 09/15/2021 06/05/21   Edwin Dada, MD  ferrous sulfate 325 (65 FE) MG tablet Take 1 tablet (325 mg total) by mouth 2 (two) times daily with a meal. Patient not taking: Reported on 09/15/2021 06/05/21   Edwin Dada, MD   I have reviewed the patient's current and reported prior to admission medications.  Labs:  BMP Latest Ref Rng & Units 09/15/2021 09/15/2021  09/11/2021  Glucose 70 - 99 mg/dL 99 96 94  BUN 6 - 20 mg/dL 89(H) 78(H) 80(H)  Creatinine 0.61 - 1.24 mg/dL 10.60(H) 10.61(H) 10.84(H)  Sodium 135 - 145 mmol/L 139 140 140  Potassium 3.5 - 5.1 mmol/L 5.0 5.1 4.5  Chloride 98 - 111 mmol/L 104 99 100  CO2 22 - 32 mmol/L - 18(L) 24  Calcium 8.9 - 10.3 mg/dL - 9.1 9.3    ROS:  Pertinent items noted in HPI and remainder of comprehensive ROS otherwise negative.  Physical Exam: Vitals:   09/15/21 0948 09/15/21 1200  BP:  133/89  Pulse:  (!) 122  Resp:  17  Temp: 98.2 F (36.8 C)   SpO2:  (!) 52%     General: adult male in bed in NAD HEENT: NCAT Eyes: EOMI sclera anicteric Neck: supple trachea midline  Heart: S1S2 no rub  Lungs: crackles on auscultation; on 2 liters oxygen; unlabored at rest Abdomen: soft/nt/obese habitus Extremities: 1+ edema lower extremities; left arm is wrapped loosely Skin: no rash on extremities exposed Psych normal mood and affect Neuro: alert and oriented x 3 provides hx and follows commands GU  massive scrotal edema Access RIJ tunneled catheter in place  Outpatient HD orders:  [facility] patient)  4 hours  450 BF, 800 DF MWF 2K/2 Ca bath EDW listed as 67 kg however he has not been to treatment there since 12/5 with a post weight of 68.6 kg (has been both noncompliant and intermittently at [facility]) Calcitriol 1.5 mcg three times a week with HD No ESA is ordered (however has been admitted and noncompliant)  Assessment/Plan:  # ESRD on HD - HD per MWF schedule and as we are able to offer extra treatments to optimize volume we will.  He is best served by going to his outpatient dialysis facility for his regular treatments  # Anasarca with massive scrotal edema - note primary team has contacted urology and they have recommended scrotal elevation and fluid removal   # Chronic combined systolic and diastolic CHF  - optimize volume status with HD  # Anemia CKD  - likely  recently dosed inpatient at an outside hospital  - for aranesp 100 mcg on mondays for now    # Metabolic bone disease  - continue phoslo and sensipar.  Will resume calcitriol   # Noncompliance  - complicates his care and is the reason for the anasarca and scrotal edema; we thoughtfully discussed this    # COPD  - noted pt on 2 liters oxygen at home as needed   Disposition - per primary team   Claudia Desanctis 09/15/2021, 3:49 PM

## 2021-09-15 NOTE — ED Notes (Signed)
Attempted report x 2 

## 2021-09-15 NOTE — H&P (Signed)
TRH H&P   Patient Demographics:    Jordan [name], is a 55 y.o. male  MRN: [id number]   DOB - [birth year]  Admit Date - 09/15/2021  Outpatient Primary MD for the patient is [facility], [facility], MD  Patient coming from: [facility] ER  Chief Complaint  Patient presents with   Groin Swelling      HPI:    Jordan [name]  is a 55 y.o. male, Jordan [name] is a 55 y.o. male with medical history significant for end-stage renal disease on hemodialysis (dialysis days are M/W/F), anemia of chronic kidney disease, COPD chronic respiratory failure on 2 L, hypertension who has recently been admitted multiple times to [facility] hospital and signed out AMA now comes to the hospital with continued scrotal swelling.  In [facility], ER he was found to have massive fluid overload, anasarca with scrotal swelling, troponin was checked in the ER as a routine and it was found to be quite elevated.  Nephrology and cardiology were consulted and I was requested to admit the patient for massive fluid overload and incidental elevation of troponin.  Patient denies any fever chills, no headache, no chest pain or palpitations, no shortness of breath, no abdominal pain, no focal weakness, no nausea vomiting, no blood in stool or urine.  He does have lot of scrotal ache and edema.   Review of systems:    A full 10 point Review of Systems was done, except as stated above, all other Review of Systems were negative.   With Past History of the following :    Past Medical History:  Diagnosis Date   Anemia    Aortic atherosclerosis ([place]) 11/12/2019   ED (erectile dysfunction)    Emphysema of lung ([place]) 11/12/2019   ESRD on hemodialysis ([place])    M/W/F in  [place], [place]   ETOH abuse    History of blood transfusion    Hypertension    Wears dentures       Past Surgical History:  Procedure Laterality Date   A/V FISTULAGRAM Left 07/15/2021   Procedure: A/V FISTULAGRAM;  Surgeon: Algernon Huxley, MD;  Location: [place] [facility];  Service: Cardiovascular;  Laterality: Left;   BASCILIC VEIN TRANSPOSITION Left 08/07/2016   Procedure: LEFT BASILIC VEIN TRANSPOSITION;  Surgeon: Angelia Mould, MD;  Location: [place];  Service: Vascular;  Laterality: Left;   CLOSED REDUCTION NASAL FRACTURE N/A 08/11/2019   Procedure: CLOSED REDUCTION NASAL FRACTURE WITH STABILIZATION;  Surgeon: Irene Limbo, MD;  Location: [place];  Service: Plastics;  Laterality: N/A;   COLONOSCOPY N/A 08/12/2016   Procedure: COLONOSCOPY;  Surgeon: Otis Brace, MD;  Location: [place];  Service: Gastroenterology;  Laterality: N/A;   COLONOSCOPY WITH PROPOFOL N/A 06/05/2021   Procedure: COLONOSCOPY WITH PROPOFOL;  Surgeon: Sharyn Creamer, MD;  Location: [place];  Service: Gastroenterology;  Laterality: N/A;   DIALYSIS/PERMA CATHETER INSERTION N/A 07/26/2021   Procedure: DIALYSIS/PERMA CATHETER INSERTION;  Surgeon: Algernon Huxley, MD;  Location: [place] [facility];  Service: Cardiovascular;  Laterality: N/A;   ESOPHAGOGASTRODUODENOSCOPY N/A 08/12/2016   Procedure: ESOPHAGOGASTRODUODENOSCOPY (EGD);  Surgeon: Otis Brace, MD;  Location: [place];  Service: Gastroenterology;  Laterality: N/A;   ESOPHAGOGASTRODUODENOSCOPY (EGD) WITH PROPOFOL N/A 06/05/2021   Procedure: ESOPHAGOGASTRODUODENOSCOPY (EGD) WITH PROPOFOL;  Surgeon: Sharyn Creamer, MD;  Location: [place];  Service: Gastroenterology;  Laterality: N/A;   INSERTION OF DIALYSIS CATHETER N/A 08/07/2016   Procedure: INSERTION OF TUNNELED DIALYSIS CATHETER;  Surgeon: Angelia Mould, MD;  Location: [place];  Service: Vascular;  Laterality: N/A;   LIGATION OF ARTERIOVENOUS  FISTULA Left 09/12/2016    Procedure: BANDING OF LEFT  ARTERIOVENOUS  FISTULA;  Surgeon: Angelia Mould, MD;  Location: [place];  Service: Vascular;  Laterality: Left;   LIGATION OF ARTERIOVENOUS  FISTULA Left 07/17/2021   Procedure: LIGATION OF ARTERIOVENOUS  FISTULA;  Surgeon: Katha Cabal, MD;  Location: [facility];  Service: Vascular;  Laterality: Left;  also include placement of temporary dialysis catheter right or left groin   LOWER EXTREMITY INTERVENTION Right 12/02/2018   Procedure: LOWER EXTREMITY INTERVENTION;  Surgeon: Algernon Huxley, MD;  Location: [place] [facility];  Service: Cardiovascular;  Laterality: Right;   POLYPECTOMY  06/05/2021   Procedure: POLYPECTOMY;  Surgeon: Sharyn Creamer, MD;  Location: [facility] ENDOSCOPY;  Service: Gastroenterology;;   TEMPORARY DIALYSIS CATHETER Left 07/17/2021   Procedure: TEMPORARY DIALYSIS CATHETER;  Surgeon: Katha Cabal, MD;  Location: [facility];  Service: Vascular;  Laterality: Left;      Social History:     Social History   Tobacco Use   Smoking status: Every Day    Packs/day: 0.50    Years: 40.00    Pack years: 20.00    Types: Cigarettes   Smokeless tobacco: Never  Substance Use Topics   Alcohol use: Not Currently    Alcohol/week: 21.0 standard drinks    Types: 21 Cans of beer per week    Comment: last drink 6 months ago         Family History :     Family History  Problem Relation Age of Onset   Diabetes Mother    Kidney failure Mother    Healthy Father    Kidney failure Brother    Healthy Sister    Kidney disease Daughter    Post-traumatic stress disorder Neg Hx    Bladder Cancer Neg Hx    Kidney cancer Neg Hx        Home Medications:   Prior to Admission medications   Medication Sig Start Date End Date Taking? Authorizing Provider  calcium acetate (PHOSLO) 667 MG capsule Take 2 capsules (1,334 mg total) by mouth 3 (three) times daily with meals. 06/05/21  Yes Danford, Suann Larry, MD  epoetin alfa (EPOGEN) 4000 UNIT/ML  injection Inject 1 mL (4,000  Units total) into the vein every Monday, Wednesday, and Friday with hemodialysis. 06/22/20  Yes Enzo Bi, MD  albuterol (VENTOLIN HFA) 108 (90 Base) MCG/ACT inhaler Inhale 2 puffs into the lungs every 4 (four) hours as needed for wheezing or shortness of breath. Patient not taking: Reported on 09/15/2021 06/05/21   Edwin Dada, MD  cinacalcet (SENSIPAR) 30 MG tablet Take 1 tablet (30 mg total) by mouth daily with breakfast. Patient not taking: Reported on 09/15/2021 06/05/21   Edwin Dada, MD  ferrous sulfate 325 (65 FE) MG tablet Take 1 tablet (325 mg total) by mouth 2 (two) times daily with a meal. Patient not taking: Reported on 09/15/2021 06/05/21   Edwin Dada, MD     Allergies:     Allergies  Allergen Reactions   Lisinopril Swelling     Physical Exam:   Vitals  Blood pressure 122/90, pulse 72, temperature 98.2 F (36.8 C), temperature source Oral, resp. rate (!) 22, height 5\' 4"  (1.626 m), weight 72.6 kg, SpO2 99 %.   1. General -patient African-American gentleman laying in hospital bed in no distress whatsoever.  2. Normal affect and insight, Not Suicidal or Homicidal, Awake Alert,   3. No F.N deficits, ALL C.Nerves Intact, Strength 5/5 all 4 extremities, Sensation intact all 4 extremities, Plantars down going.  4. Ears and Eyes appear Normal, Conjunctivae clear, PERRLA. Moist Oral Mucosa.  5. Supple Neck, No JVD, No cervical lymphadenopathy appriciated, No Carotid Bruits.  6. Symmetrical Chest wall movement, Good air movement bilaterally, CTAB.  7. RRR, No Gallops, Rubs or Murmurs, No Parasternal Heave.  8. Positive Bowel Sounds, Abdomen Soft, No tenderness, No organomegaly appriciated,No rebound -guarding or rigidity.  9.  No Cyanosis, Normal Skin Turgor, No Skin Rash or Bruise.  5+ scrotal edema, 3+ abdominal wall, 2+ leg edema, no signs of scrotal cellulitis, scrotal exam itself is nontender  10. Good  muscle tone,  joints appear normal , no effusions, Normal ROM.  11. No Palpable Lymph Nodes in Neck or Axillae      Data Review:    CBC Recent Labs  Lab 09/09/21 1412 09/11/21 2028 09/15/21 0650 09/15/21 0914  WBC 9.0 7.2 12.3*  --   HGB 7.5* 7.4* 8.6* 8.8*  HCT 24.3* 23.8* 28.5* 26.0*  PLT 143* 134* 187  --   MCV 102.5* 103.0* 104.8*  --   MCH 31.6 32.0 31.6  --   MCHC 30.9 31.1 30.2  --   RDW 17.3* 16.7* 16.2*  --   LYMPHSABS  --  0.7 0.8  --   MONOABS  --  0.8 1.5*  --   EOSABS  --  0.2 0.1  --   BASOSABS  --  0.0 0.1  --    ------------------------------------------------------------------------------------------------------------------  Chemistries  Recent Labs  Lab 09/09/21 1412 09/09/21 2100 09/09/21 2203 09/10/21 0658 09/11/21 2028 09/15/21 0650 09/15/21 0914  NA 140  --   --  141 140 140 139  K 7.0*   < > 4.5 4.1 4.5 5.1 5.0  CL 104  --   --  101 100 99 104  CO2 17*  --   --  25 24 18*  --   GLUCOSE 130*  --   --  130* 94 96 99  BUN 119*  --   --  64* 80* 78* 89*  CREATININE 14.90*  --   --  9.21* 10.84* 10.61* 10.60*  CALCIUM 9.3  --   --  9.2 9.3  9.1  --   AST 23  --   --   --  26  --   --   ALT 11  --   --   --  15  --   --   ALKPHOS 121  --   --   --  120  --   --   BILITOT 1.3*  --   --   --  1.0  --   --    < > = values in this interval not displayed.   ------------------------------------------------------------------------------------------------------------------ estimated creatinine clearance is 7.3 mL/min (A) (by C-G formula based on SCr of 10.6 mg/dL (H)). ------------------------------------------------------------------------------------------------------------------ No results for input(s): TSH, T4TOTAL, T3FREE, THYROIDAB in the last 72 hours.  Invalid input(s): FREET3  Coagulation profile No results for input(s): INR, PROTIME in the last 168  hours. ------------------------------------------------------------------------------------------------------------------- No results for input(s): DDIMER in the last 72 hours. -------------------------------------------------------------------------------------------------------------------  Cardiac Enzymes No results for input(s): CKMB, TROPONINI, MYOGLOBIN in the last 168 hours.  Invalid input(s): CK ------------------------------------------------------------------------------------------------------------------    Component Value Date/Time   BNP 3,856.3 (H) 09/15/2021 0650     ---------------------------------------------------------------------------------------------------------------  Urinalysis    Component Value Date/Time   COLORURINE YELLOW (A) 01/13/2021 0153   APPEARANCEUR CLEAR (A) 01/13/2021 0153   LABSPEC 1.012 01/13/2021 0153   PHURINE 9.0 (H) 01/13/2021 0153   GLUCOSEU 150 (A) 01/13/2021 0153   HGBUR NEGATIVE 01/13/2021 0153   BILIRUBINUR NEGATIVE 01/13/2021 0153   KETONESUR NEGATIVE 01/13/2021 0153   PROTEINUR >=300 (A) 01/13/2021 0153   NITRITE NEGATIVE 01/13/2021 0153   LEUKOCYTESUR NEGATIVE 01/13/2021 0153    ----------------------------------------------------------------------------------------------------------------   Imaging Results:    DG Chest Portable 1 View  Result Date: 09/15/2021 CLINICAL DATA:  Shortness of breath.  Testicular swelling EXAM: PORTABLE CHEST 1 VIEW COMPARISON:  09/12/2021 FINDINGS: Right dialysis catheter remains in place, unchanged. Enlargement of the cardiopericardial silhouette. Appearance suggest the possibility of pericardial effusion. No confluent airspace opacities, effusions or edema. No acute bony abnormality. IMPRESSION: Enlargement of the cardiopericardial silhouette, similar to prior study. No acute cardiopulmonary disease. Electronically Signed   By: Rolm Baptise M.D.   On: 09/15/2021 06:56   ECHOCARDIOGRAM  COMPLETE  Result Date: 09/15/2021    ECHOCARDIOGRAM REPORT   Patient Name:   WINNIE BARSKY Date of Exam: 09/15/2021 Medical Rec #:  [id number]        Height:       64.0 in Accession #:    [id number]       Weight:       160.1 lb Date of Birth:  Sep 11, 1966        BSA:          1.780 m Patient Age:    74 years         BP:           122/90 mmHg Patient Gender: M                HR:           86 bpm. Exam Location:  Inpatient Procedure: 2D Echo STAT ECHO Indications:     dyspnea  History:         Patient has prior history of Echocardiogram examinations, most                  recent 07/20/2021. CHF, end stage renal disease; Risk                  Factors:Hypertension and Current Smoker.  Sonographer:     Johny Chess RDCS Referring Phys:  [id number] JGGEZM G BARRETT Diagnosing Phys: Oswaldo Milian MD IMPRESSIONS  1. Left ventricular ejection fraction, by estimation, is 45 to 50%. The left ventricle has mildly decreased function. The left ventricle demonstrates global hypokinesis. There is mild left ventricular hypertrophy. Left ventricular diastolic parameters are indeterminate.  2. Right ventricular systolic function is moderately reduced. The right ventricular size is moderately enlarged. There is severely elevated pulmonary artery systolic pressure. The estimated right ventricular systolic pressure is 62.9 mmHg.  3. Left atrial size was moderately dilated.  4. Right atrial size was mildly dilated.  5. Moderate pericardial effusion, measures 1.1 cm adjacent to LV lateral wall. No RA/RV collapse seen to suggest tamponade  6. The mitral valve is degenerative. Moderate mitral valve regurgitation. Eccentric posterior directed jet, may be underestimating MR due to eccentric jet  7. The tricuspid valve is abnormal. Tricuspid valve regurgitation is severe.  8. The aortic valve was not well visualized. Aortic valve regurgitation is not visualized. Aortic valve sclerosis/calcification is present, without any evidence of  aortic stenosis.  9. The inferior vena cava is dilated in size with <50% respiratory variability, suggesting right atrial pressure of 15 mmHg. FINDINGS  Left Ventricle: Left ventricular ejection fraction, by estimation, is 45 to 50%. The left ventricle has mildly decreased function. The left ventricle demonstrates global hypokinesis. The left ventricular internal cavity size was normal in size. There is  mild left ventricular hypertrophy. Left ventricular diastolic parameters are indeterminate. Right Ventricle: The right ventricular size is moderately enlarged. Right vetricular wall thickness was not well visualized. Right ventricular systolic function is moderately reduced. There is severely elevated pulmonary artery systolic pressure. The tricuspid regurgitant velocity is 4.33 m/s, and with an assumed right atrial pressure of 15 mmHg, the estimated right ventricular systolic pressure is 47.6 mmHg. Left Atrium: Left atrial size was moderately dilated. Right Atrium: Right atrial size was mildly dilated. Pericardium: A moderately sized pericardial effusion is present. There is no evidence of cardiac tamponade. Mitral Valve: The mitral valve is degenerative in appearance. Moderate mitral valve regurgitation. Tricuspid Valve: The tricuspid valve is abnormal. Tricuspid valve regurgitation is severe. Aortic Valve: The aortic valve was not well visualized. Aortic valve regurgitation is not visualized. Aortic valve sclerosis/calcification is present, without any evidence of aortic stenosis. Pulmonic Valve: The pulmonic valve was grossly normal. Pulmonic valve regurgitation is not visualized. Aorta: The aortic root and ascending aorta are structurally normal, with no evidence of dilitation. Venous: The inferior vena cava is dilated in size with less than 50% respiratory variability, suggesting right atrial pressure of 15 mmHg. IAS/Shunts: The interatrial septum was not well visualized.  LEFT VENTRICLE PLAX 2D LVIDd:          5.00 cm LVIDs:         3.90 cm LV PW:         1.20 cm LV IVS:        1.20 cm LVOT diam:     1.90 cm LVOT Area:     2.84 cm  RIGHT VENTRICLE            IVC RV S prime:     6.31 cm/s  IVC diam: 2.80 cm TAPSE (M-mode): 0.7 cm LEFT ATRIUM             Index        RIGHT ATRIUM           Index LA diam:  4.80 cm 2.70 cm/m   RA Area:     21.20 cm LA Vol (A2C):   83.2 ml 46.75 ml/m  RA Volume:   68.00 ml  38.21 ml/m LA Vol (A4C):   75.2 ml 42.26 ml/m LA Biplane Vol: 85.9 ml 48.27 ml/m   AORTA Ao Root diam: 3.00 cm Ao Asc diam:  3.10 cm TRICUSPID VALVE TR Peak grad:   75.0 mmHg TR Vmax:        433.00 cm/s  SHUNTS Systemic Diam: 1.90 cm Oswaldo Milian MD Electronically signed by Oswaldo Milian MD Signature Date/Time: 09/15/2021/11:19:20 AM    Final (Updated)     My personal review of EKG: Rhythm NSR, nonacute EKG changes   Assessment & Plan:     1.  ESRD with history of noncompliance with HD treatments, multiple AMA discharges from [facility] hospital.  He has evidence of massive fluid overload, nephrology has been consulted and will undergo dialysis treatment per nephrology schedule.  2.  Anasarca with massive scrotal edema.  Fluid removal as in #1, scrotal exam is benign no signs of infection recent ultrasound from few days ago noted.  Case discussed with urologist [name] on 09/15/2021.  Continue scrotal elevation and fluid removal.  3.  Anemia of chronic disease.  No acute issues iron supplementation in April.  4.  COPD.  On 2 L as needed oxygen at home.  Supportive care no acute issues  5.  GERD.  On PPI  6.  Incidental elevation in troponin with no chest pain.  EKG nonacute, seen by cardiology underwent stat echocardiogram, EF is 45% with global hypokinesis which is largely unchanged from echocardiogram done in September 2022, he is chest pain-free, have placed him on low-dose aspirin, Coreg and Crestor.  Further management per cardiology.  7.  Chronic combined systolic and  diastolic heart failure.  Fluid removal through HD, kindly see #6 above.   DVT Prophylaxis Heparin    AM Labs Ordered, also please review Full Orders  Family Communication: Admission, patients condition and plan of care including tests being ordered have been discussed with the patient who indicates understanding and agree with the plan and Code Status.  Code Status Full  Likely DC to  Home  Condition GUARDED    Consults called: Cards, Renal, Urology DR Milford Cage over the phone    Admission status: Inpt  Time spent in minutes : 45   Lala Lund M.D on 09/15/2021 at 12:46 PM  To page go to [email or web address] - password [facility]

## 2021-09-15 NOTE — ED Notes (Signed)
Pt requested something to eat. Informed pt he will have to wait until admitting provider approves a diet.

## 2021-09-15 NOTE — Plan of Care (Signed)
°  Problem: Activity: Goal: Risk for activity intolerance will decrease Outcome: Progressing   Problem: Clinical Measurements: Goal: Respiratory complications will improve Outcome: Not Progressing   Problem: Pain Managment: Goal: General experience of comfort will improve Outcome: Not Progressing

## 2021-09-16 DIAGNOSIS — R778 Other specified abnormalities of plasma proteins: Secondary | ICD-10-CM

## 2021-09-16 DIAGNOSIS — N186 End stage renal disease: Secondary | ICD-10-CM

## 2021-09-16 MED ORDER — DARBEPOETIN ALFA 100 MCG/0.5ML IJ SOSY
100.0000 ug | PREFILLED_SYRINGE | INTRAMUSCULAR | Status: DC
  Administered 2021-09-18: 100 ug via INTRAVENOUS
  Filled 2021-09-16: qty 0.5

## 2021-09-16 MED ORDER — ISOSORB DINITRATE-HYDRALAZINE 20-37.5 MG PO TABS
1.0000 | ORAL_TABLET | Freq: Three times a day (TID) | ORAL | Status: DC
  Administered 2021-09-16 – 2021-09-19 (×9): 1 via ORAL
  Filled 2021-09-16 (×9): qty 1

## 2021-09-16 NOTE — Evaluation (Signed)
Physical Therapy Evaluation Patient Details Name: Jordan [name] MRN: [id number] DOB: 1966-01-23 Today's Date: 09/16/2021  History of Present Illness  Pt is a 55 y.o. M who presents 09/15/2021 with continued scrotal swelling, incidental elevation in troponin with no chest pain. Pt has been admittted multiple times to [facility] hospital and signed out AMA. Significant PMH: ESRD, anemia of CKD, COPD on 2L O2, hypertension.  Clinical Impression  Pt admitted with above. He is very restless upon arrival; reporting the "sheets," were making him itchy (RN notified). Pt also reporting BLE achiness and discomfort due to scrotal swelling. Pt ambulating room distances with no assistive device without physical difficulty; pt declining hallway ambulation. Provided with towel roll for scrotal elevation. May benefit from OT consult for scrotal sling; orders placed. Will follow acutely, but don't anticipate need for PT follow up.      Recommendations for follow up therapy are one component of a multi-disciplinary discharge planning process, led by the attending physician.  Recommendations may be updated based on patient status, additional functional criteria and insurance authorization.  Follow Up Recommendations No PT follow up    Assistance Recommended at Discharge None  Functional Status Assessment Patient has had a recent decline in their functional status and demonstrates the ability to make significant improvements in function in a reasonable and predictable amount of time.  Equipment Recommendations  None recommended by PT    Recommendations for Other Services       Precautions / Restrictions Precautions Precautions: None Restrictions Weight Bearing Restrictions: No      Mobility  Bed Mobility Overal bed mobility: Independent                  Transfers Overall transfer level: Independent Equipment used: None                    Ambulation/Gait Ambulation/Gait assistance:  Independent Gait Distance (Feet): 10 Feet Assistive device: None Gait Pattern/deviations: WFL(Within Functional Limits)       General Gait Details: Pt ambulating to sink and back with no gross imbalance noted, refusing further distance due to discomfort in scrotum and BLE's  Stairs            Wheelchair Mobility    Modified Rankin (Stroke Patients Only)       Balance Overall balance assessment: No apparent balance deficits (not formally assessed)                                           Pertinent Vitals/Pain Pain Assessment: Faces Faces Pain Scale: Hurts even more Pain Location: BLEs, scrotum Pain Descriptors / Indicators: Aching Pain Intervention(s): Monitored during session;Other (comment) (RN notified)    Home Living Family/patient expects to be discharged to:: Private residence Living Arrangements: Alone Available Help at Discharge: Family;Available PRN/intermittently Type of Home: House Home Access: Stairs to enter Entrance Stairs-Rails: None Entrance Stairs-Number of Steps: 4   Home Layout: One level Home Equipment: Shower seat      Prior Function Prior Level of Function : Independent/Modified Independent             Mobility Comments: uses [facility] for transportation to/from dialysis       Hand Dominance   Dominant Hand: Right    Extremity/Trunk Assessment   Upper Extremity Assessment Upper Extremity Assessment: Overall WFL for tasks assessed    Lower Extremity Assessment Lower Extremity  Assessment: Overall WFL for tasks assessed    Cervical / Trunk Assessment Cervical / Trunk Assessment: Normal  Communication   Communication: No difficulties  Cognition Arousal/Alertness: Awake/alert Behavior During Therapy: Restless Overall Cognitive Status: Within Functional Limits for tasks assessed                                          General Comments      Exercises     Assessment/Plan    PT  Assessment Patient needs continued PT services  PT Problem List Decreased mobility;Pain       PT Treatment Interventions Gait training;Stair training;Functional mobility training;Therapeutic activities;Therapeutic exercise;Balance training;Patient/family education    PT Goals (Current goals can be found in the Care Plan section)  Acute Rehab PT Goals Patient Stated Goal: improved scrotal swelling PT Goal Formulation: With patient Time For Goal Achievement: 09/30/21 Potential to Achieve Goals: Good    Frequency Min 3X/week   Barriers to discharge        Co-evaluation               AM-PAC PT "6 Clicks" Mobility  Outcome Measure Help needed turning from your back to your side while in a flat bed without using bedrails?: None Help needed moving from lying on your back to sitting on the side of a flat bed without using bedrails?: None Help needed moving to and from a bed to a chair (including a wheelchair)?: None Help needed standing up from a chair using your arms (e.g., wheelchair or bedside chair)?: None Help needed to walk in hospital room?: None Help needed climbing 3-5 steps with a railing? : A Little 6 Click Score: 23    End of Session   Activity Tolerance: Patient limited by pain Patient left: in bed;with call bell/phone within reach Nurse Communication: Mobility status;Other (comment) (pt itching) PT Visit Diagnosis: Difficulty in walking, not elsewhere classified (R26.2);Pain Pain - part of body:  (scrotum, BLE's)    Time: 2122-4825 PT Time Calculation (min) (ACUTE ONLY): 19 min   Charges:   PT Evaluation $PT Eval Low Complexity: [place], PT, DPT Acute Rehabilitation Services Pager [phone] Office [phone]   Deno Etienne 09/16/2021, 10:01 AM

## 2021-09-16 NOTE — Progress Notes (Signed)
[facility] Progress Note  Subjective: pt seen in HD  Vitals:   09/16/21 1024 09/16/21 1037 09/16/21 1100 09/16/21 1130  BP: 129/84 (!) 133/91 123/75 116/72  Pulse: 86 87 86 83  Resp: 16 19 (!) 22 20  Temp: (!) 97.4 F (36.3 C)     TempSrc: Oral     SpO2: 92%     Weight: 72.8 kg     Height:        Exam:  alert, nad   no jvd  Chest basilar rales, no wheezing   Cor reg no RG  Abd soft ntnd no ascites   Ext 1-2+ scrotal and LE edema   Alert, NF, ox3    RIJ TDC     OP HD: [place] ([facility]) MWF  4h  450/800  2/2 bath  67kg  TDC  Hep ?    - calcitriol 1.5ug tiw po   Assessment/ Plan: Anasarca/ volume overload - chronic vol overload issue die to noncompliance w/ his diet and HD sessions. HD today, max UF as tolerated.  COPD - on home O2 ESRD - on HD MWF. HD today.  Anemia ckd - give darbe 100ug on mondays here MBD ckd - cont phoslo, sensipar, vdra Chronic combined CHF      Rob Judy Goodenow 09/16/2021, 3:09 PM   Recent Labs  Lab 09/11/21 2028 09/15/21 0650 09/15/21 0914  K 4.5 5.1 5.0  BUN 80* 78* 89*  CREATININE 10.84* 10.61* 10.60*  CALCIUM 9.3 9.1  --   HGB 7.4* 8.6* 8.8*   Inpatient medications:  aspirin  81 mg Oral Daily   calcium acetate  1,334 mg Oral TID WC   carvedilol  3.125 mg Oral BID WC   Chlorhexidine Gluconate Cloth  6 each Topical Q0600   cinacalcet  30 mg Oral Q breakfast   [START ON 09/18/2021] darbepoetin (ARANESP) injection - DIALYSIS  100 mcg Intravenous Q Wed-HD   ferrous sulfate  325 mg Oral BID WC   heparin  5,000 Units Subcutaneous Q8H   isosorbide-hydrALAZINE  1 tablet Oral TID   rosuvastatin  10 mg Oral Daily   sodium chloride flush  3 mL Intravenous Q12H    acetaminophen **OR** acetaminophen, albuterol, hydrOXYzine, ondansetron **OR** ondansetron (ZOFRAN) IV, senna-docusate

## 2021-09-16 NOTE — Progress Notes (Signed)
PROGRESS NOTE                                                                                                                                                                                                             Patient Demographics:    Jordan [name], is a 55 y.o. male, DOB - [birth year], ONG:[id number]  Outpatient Primary MD for the patient is [facility], [facility], MD    LOS - 1  Admit date - 09/15/2021    Chief Complaint  Patient presents with   Groin Swelling       Brief Narrative (HPI from H&P) -  Regginald Pask  is a 55 y.o. male, BROOK MALL is a 55 y.o. male with medical history significant for end-stage renal disease on hemodialysis (dialysis days are M/W/F), anemia of chronic kidney disease, COPD chronic respiratory failure on 2 L, hypertension who has recently been admitted multiple times to [facility] hospital and signed out AMA now comes to the hospital with continued scrotal swelling.  In [facility], ER he was found to have massive fluid overload, anasarca with scrotal swelling, troponin was checked in the ER as a routine and it was found to be quite elevated.  Nephrology and cardiology were consulted and I was requested to admit the patient for massive fluid overload and incidental elevation of troponin.   Subjective:    Alla German today has, No headache, No chest pain, No abdominal pain - No Nausea, No new weakness tingling or numbness, no SOB.   Assessment  & Plan :    1.  ESRD with history of noncompliance with HD treatments, multiple AMA discharges from [facility] hospital.  He has evidence of massive fluid overload, nephrology has been consulted and will undergo dialysis treatment per nephrology schedule.  2.  Anasarca with massive scrotal edema.  Fluid removal as in #1, scrotal exam is benign no signs of infection recent ultrasound from few days ago noted.  Case discussed with urologist [name]  on 09/15/2021.  Continue scrotal elevation and fluid removal.  3.  Anemia of chronic disease.  No acute issues iron supplementation in April.  4.  COPD.  On 2 L as needed oxygen at home.  Supportive care no acute issues  5.  GERD.  On PPI  6.  Incidental elevation in troponin with no chest pain.  EKG nonacute, seen by cardiology underwent stat echocardiogram, EF is 45% with global hypokinesis which is largely unchanged from echocardiogram done in September 2022, he is chest pain-free, have placed him on low-dose aspirin, Coreg and Crestor.  Further management per cardiology.  7.  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  Fluid removal through HD, kindly see #6 above.  8.  Hypertension.  On Coreg.  Add BiDil.       Condition - Fair  Family Communication  :  none present  Code Status :  Full  Consults  :  Cards, Renal, Urology -phone  PUD Prophylaxis :     Procedures  :     TTE - 1. Left ventricular ejection fraction, by estimation, is 45 to 50%. The left ventricle has mildly decreased function. The left ventricle demonstrates global hypokinesis. There is mild left ventricular hypertrophy. Left ventricular diastolic parameters are indeterminate.  2. Right ventricular systolic function is moderately reduced. The right ventricular size is moderately enlarged. There is severely elevated pulmonary artery systolic pressure. The estimated right ventricular systolic pressure is 02.5 mmHg.  3. Left atrial size was moderately dilated.  4. Right atrial size was mildly dilated.  5. Moderate pericardial effusion, measures 1.1 cm adjacent to LV lateral wall. No RA/RV collapse seen to suggest tamponade  6. The mitral valve is degenerative. Moderate mitral valve regurgitation. Eccentric posterior directed jet, may be underestimating MR due to eccentric jet  7. The tricuspid valve is abnormal. Tricuspid valve regurgitation is severe.  8. The aortic valve was not well visualized.  Aortic valve regurgitation is not visualized. Aortic valve sclerosis/calcification is present, without any evidence of aortic stenosis.  9. The inferior vena cava is dilated in size with <50% respiratory variability, suggesting right atrial pressure of 15 mmHg  CT -  No significant change in large amount of ascites, and diffuse mesenteric and body wall edema. Stable moderate pericardial effusion. New ill-defined nodular opacities in superior left lower lobe and lingula, most likely inflammatory or infectious in etiology. Continued follow-up by chest CT is recommended in 2-3 months. 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   Scrotal US - [facility] -   Normal-appearing testicles. Diffuse scrotal edema Is noted.      Disposition Plan  :    Status is: Inpatient  Remains inpatient appropriate because: Anasarca with massive scrotal edema  DVT Prophylaxis  :    heparin injection 5,000 Units Start: 09/15/21 1400  Lab Results  Component Value Date   PLT 187 09/15/2021    Diet :  Diet Order             Diet renal with fluid restriction Fluid restriction: 1200 mL Fluid; Room service appropriate? Yes; Fluid consistency: Thin  Diet effective now                    Inpatient Medications  Scheduled Meds:  aspirin  81 mg Oral Daily   calcitRIOL  1.5 mcg Oral Q M,W,F-HD   calcium acetate  1,334 mg Oral TID WC   carvedilol  3.125 mg Oral BID WC   Chlorhexidine Gluconate Cloth  6 each Topical Q0600   cinacalcet  30 mg Oral Q breakfast   darbepoetin (ARANESP) injection - DIALYSIS  100  mcg Intravenous Q Mon-HD   ferrous sulfate  325 mg Oral BID WC   heparin  5,000 Units Subcutaneous Q8H   rosuvastatin  10 mg Oral Daily   sodium chloride flush  3 mL Intravenous Q12H   Continuous Infusions: PRN Meds:.acetaminophen **OR** acetaminophen, albuterol, hydrOXYzine, ondansetron **OR** ondansetron (ZOFRAN) IV, senna-docusate  Antibiotics  :    Anti-infectives (From admission,  onward)    None        Time Spent in minutes  30   Lala Lund M.D on 09/16/2021 at 9:40 AM  To page go to [email or web address]   [facility] -  Office  [phone]  See all Orders from today for further details    Objective:   Vitals:   09/15/21 0948 09/15/21 1200 09/15/21 1603 09/15/21 2016  BP:  133/89 125/62 (!) 147/97  Pulse:  (!) 122 97 98  Resp:  17 16 20   Temp: 98.2 F (36.8 C)  97.8 F (36.6 C) 97.8 F (36.6 C)  TempSrc: Oral  Oral   SpO2:  (!) 52% 100% 96%  Weight:      Height:        Wt Readings from Last 3 Encounters:  09/15/21 72.6 kg  09/12/21 72.6 kg  09/09/21 72.6 kg     Intake/Output Summary (Last 24 hours) at 09/16/2021 0940 Last data filed at 09/16/2021 0800 Gross per 24 hour  Intake 240 ml  Output --  Net 240 ml     Physical Exam  Awake Alert, No new F.N deficits, Normal affect [place].AT,PERRAL Supple Neck,No JVD, No cervical lymphadenopathy appriciated.  Symmetrical Chest wall movement, Good air movement bilaterally, CTAB RRR,No Gallops,Rubs or new Murmurs, No Parasternal Heave +ve B.Sounds, Abd Soft, No tenderness, No organomegaly appriciated, No rebound - guarding or rigidity. No Cyanosis, 2+ edema, 3+ scrotal edema     RN pressure injury documentation: Pressure Injury 07/14/21 Buttocks Mid Stage 2 -  Partial thickness loss of dermis presenting as a shallow open injury with a red, pink wound bed without slough. open area (Active)  07/14/21 1402  Location: Buttocks  Location Orientation: Mid  Staging: Stage 2 -  Partial thickness loss of dermis presenting as a shallow open injury with a red, pink wound bed without slough.  Wound Description (Comments): open area  Present on Admission: Yes    Data Review:    CBC Recent Labs  Lab 09/09/21 1412 09/11/21 2028 09/15/21 0650 09/15/21 0914  WBC 9.0 7.2 12.3*  --   HGB 7.5* 7.4* 8.6* 8.8*  HCT 24.3* 23.8* 28.5* 26.0*  PLT 143* 134* 187  --   MCV 102.5* 103.0* 104.8*   --   MCH 31.6 32.0 31.6  --   MCHC 30.9 31.1 30.2  --   RDW 17.3* 16.7* 16.2*  --   LYMPHSABS  --  0.7 0.8  --   MONOABS  --  0.8 1.5*  --   EOSABS  --  0.2 0.1  --   BASOSABS  --  0.0 0.1  --     Recent Labs  Lab 09/09/21 1412 09/09/21 2100 09/09/21 2203 09/10/21 0658 09/11/21 2028 09/15/21 0650 09/15/21 0914  NA 140  --   --  141 140 140 139  K 7.0*   < > 4.5 4.1 4.5 5.1 5.0  CL 104  --   --  101 100 99 104  CO2 17*  --   --  25 24 18*  --   GLUCOSE 130*  --   --  130* 94 96 99  BUN 119*  --   --  64* 80* 78* 89*  CREATININE 14.90*  --   --  9.21* 10.84* 10.61* 10.60*  CALCIUM 9.3  --   --  9.2 9.3 9.1  --   AST 23  --   --   --  26  --   --   ALT 11  --   --   --  15  --   --   ALKPHOS 121  --   --   --  120  --   --   BILITOT 1.3*  --   --   --  1.0  --   --   ALBUMIN 3.7  --   --   --  3.4*  --   --   BNP >4,500.0*  --   --   --  >4,500.0* 3,856.3*  --    < > = values in this interval not displayed.    ------------------------------------------------------------------------------------------------------------------ Recent Labs    09/15/21 1935  CHOL 98  HDL 30*  LDLCALC 54  TRIG 72  CHOLHDL 3.3    Lab Results  Component Value Date   HGBA1C 5.0 04/07/2020   ------------------------------------------------------------------------------------------------------------------ No results for input(s): TSH, T4TOTAL, T3FREE, THYROIDAB in the last 72 hours.  Invalid input(s): FREET3  Cardiac Enzymes No results for input(s): CKMB, TROPONINI, MYOGLOBIN in the last 168 hours.  Invalid input(s): CK ------------------------------------------------------------------------------------------------------------------    Component Value Date/Time   BNP 3,856.3 (H) 09/15/2021 0650    Radiology Reports CT ABDOMEN PELVIS WO CONTRAST  Result Date: 08/20/2021 CLINICAL DATA:  Abdominal pain and distention. End-stage renal disease on dialysis. EXAM: CT ABDOMEN AND PELVIS  WITHOUT CONTRAST TECHNIQUE: Multidetector CT imaging of the abdomen and pelvis was performed following the standard protocol without IV contrast. COMPARISON:  05/10/2021 and 05/03/2021 FINDINGS: Lower chest: Multiple ill-defined nodular opacities are seen in the superior left lower lobe and lingula, which are new since previous studies several months ago, and most likely inflammatory or infectious in etiology. A moderate pericardial effusion shows no significant change. Hepatobiliary: No mass visualized on this unenhanced exam. Gallbladder is unremarkable no evidence of biliary ductal dilatation. Pancreas: No mass or inflammatory process visualized on this unenhanced exam. Spleen:  Within normal limits in size. Adrenals/Urinary tract: Severe diffuse bilateral renal parenchymal atrophy is consistent with chronic end-stage renal disease. No evidence of hydronephrosis. Urinary bladder is nearly completely empty. Stomach/Bowel: Unremarkable. Vascular/Lymphatic: No pathologically enlarged lymph nodes identified. No evidence of abdominal aortic aneurysm. Aortic atherosclerotic calcification noted. Mild dilatation of the right common iliac artery is again seen measuring 1.9 cm. 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 Reproductive:  Stable mild to moderately enlarged prostate. Other: Large amount of ascites and diffuse mesenteric and body wall edema show no significant change. Musculoskeletal: No suspicious bone lesions identified. Diffuse osteosclerosis, consistent with renal osteodystrophy. IMPRESSION: No significant change in large amount of ascites, and diffuse mesenteric and body wall edema. Stable moderate pericardial effusion. New ill-defined nodular opacities in superior left lower lobe and lingula, most likely inflammatory or infectious in etiology. Continued follow-up by chest CT is recommended in 2-3 months. Findings of end-stage renal disease and  renal osteodystrophy. Electronically Signed   By: Marlaine Hind M.D.   On: 08/20/2021 20:20   DG Chest 1 View  Result Date: 09/12/2021 CLINICAL DATA:  Initial evaluation for pulmonary edema. EXAM: CHEST  1 VIEW COMPARISON:  Prior radiograph from 09/09/2021. FINDINGS: Dual-lumen right IJ approach central venous catheter remains in place with tips overlying the cavoatrial junction. Moderate to advanced cardiomegaly with globular contour of the cardiac silhouette, similar to previous, and likely reflecting persistent pericardial effusion. Mediastinal silhouette normal. Aortic atherosclerosis. Lungs mildly hypoinflated. Diffuse vascular and interstitial prominence consistent with mild diffuse pulmonary interstitial edema. Overall appearance is improved as compared to 09/09/2021. No visible pleural effusion. No focal infiltrates. No pneumothorax. Osseous structures are unchanged. IMPRESSION: 1. Mild diffuse pulmonary interstitial edema, mildly improved as compared to 09/09/2021. 2. Cardiomegaly with persistent globular contour of the cardiac silhouette, likely reflecting persistent pericardial effusion as previously seen. 3.  Aortic Atherosclerosis (ICD10-I70.0). Electronically Signed   By: Jeannine Boga M.D.   On: 09/12/2021 02:48   DG Chest 2 View  Result Date: 09/02/2021 CLINICAL DATA:  Shortness of breath EXAM: CHEST - 2 VIEW COMPARISON:  08/20/2021 FINDINGS: Right dialysis catheter remains in place, unchanged. Cardiomegaly. No confluent opacities, effusions or edema. No acute bony abnormality. Aortic atherosclerosis. IMPRESSION: Cardiomegaly.  No active disease. Electronically Signed   By: Rolm Baptise M.D.   On: 09/02/2021 22:06   US Paracentesis  Result Date: 08/22/2021 INDICATION: Renal failure on hemodialysis.  Abdominal distension and ascites. EXAM: ULTRASOUND GUIDED  PARACENTESIS MEDICATIONS: Lidocaine 1% subcutaneous COMPLICATIONS: None immediate. PROCEDURE: Informed written consent was  obtained from the patient after a discussion of the risks, benefits and alternatives to treatment. A timeout was performed prior to the initiation of the procedure. Initial ultrasound scanning demonstrates a large amount of ascites within the right lower abdominal quadrant. The right lower abdomen was prepped and draped in the usual sterile fashion. 1% lidocaine was used for local anesthesia. Following this, a 6 Fr Safe-T-Centesis catheter was introduced. An ultrasound image was saved for documentation purposes. The paracentesis was performed. The catheter was removed and a dressing was applied. The patient tolerated the procedure well without immediate post procedural complication. Patient received post-procedure intravenous albumin; see nursing notes for details. FINDINGS: A total of approximately 4 L of clear yellow fluid was removed. Samples were sent to the laboratory as requested by the clinical team. IMPRESSION: Successful ultrasound-guided paracentesis yielding 4 liters of peritoneal fluid. Electronically Signed   By: Lucrezia Europe M.D.   On: 08/22/2021 08:05   DG Chest Portable 1 View  Result Date: 09/15/2021 CLINICAL DATA:  Shortness of breath.  Testicular swelling EXAM: PORTABLE CHEST 1 VIEW COMPARISON:  09/12/2021 FINDINGS: Right dialysis catheter remains in place, unchanged. Enlargement of the cardiopericardial silhouette. Appearance suggest the possibility of pericardial effusion. No confluent airspace opacities, effusions or edema. No acute bony abnormality. IMPRESSION: Enlargement of the cardiopericardial silhouette, similar to prior study. No acute cardiopulmonary disease. Electronically Signed   By: Rolm Baptise M.D.   On: 09/15/2021 06:56   DG Chest Portable 1 View  Result Date: 09/09/2021 CLINICAL DATA:  Shortness of breath EXAM: PORTABLE CHEST 1 VIEW COMPARISON:  Chest x-ray 09/02/2021 FINDINGS: Cardiac silhouette is enlarged similar to previous study. Calcified plaques in the aortic  arch. Right-sided central line stable. Pulmonary vascular congestion with no focal consolidation. No pleural effusion or pneumothorax identified. IMPRESSION: Grossly stable cardiomegaly and pericardial effusion as seen on prior CT. Pulmonary vascular congestion. Electronically Signed   By: Ofilia Neas M.D.   On: 09/09/2021 14:24   DG Chest Portable 1 View  Result Date: 08/20/2021 CLINICAL DATA:  Chest pain EXAM: PORTABLE CHEST 1 VIEW COMPARISON:  08/10/2021 FINDINGS: Moderate cardiomegaly. Unchanged position of  right chest wall dual lumen catheter. Mild interstitial pulmonary edema is unchanged. No focal consolidation. No pleural effusion or pneumothorax. IMPRESSION: Cardiomegaly and mild interstitial pulmonary edema, unchanged. Electronically Signed   By: Ulyses Jarred M.D.   On: 08/20/2021 19:45   ECHOCARDIOGRAM COMPLETE  Result Date: 09/15/2021    ECHOCARDIOGRAM REPORT   Patient Name:   TYLIQUE AULL Date of Exam: 09/15/2021 Medical Rec #:  [id number]        Height:       64.0 in Accession #:    [id number]       Weight:       160.1 lb Date of Birth:  December 16, 1965        BSA:          1.780 m Patient Age:    32 years         BP:           122/90 mmHg Patient Gender: M                HR:           86 bpm. Exam Location:  Inpatient Procedure: 2D Echo STAT ECHO Indications:     dyspnea  History:         Patient has prior history of Echocardiogram examinations, most                  recent 07/20/2021. CHF, end stage renal disease; Risk                  Factors:Hypertension and Current Smoker.  Sonographer:     Johny Chess RDCS Referring Phys:  [id number] PYKDXI G BARRETT Diagnosing Phys: Oswaldo Milian MD IMPRESSIONS  1. Left ventricular ejection fraction, by estimation, is 45 to 50%. The left ventricle has mildly decreased function. The left ventricle demonstrates global hypokinesis. There is mild left ventricular hypertrophy. Left ventricular diastolic parameters are indeterminate.  2. Right  ventricular systolic function is moderately reduced. The right ventricular size is moderately enlarged. There is severely elevated pulmonary artery systolic pressure. The estimated right ventricular systolic pressure is 33.8 mmHg.  3. Left atrial size was moderately dilated.  4. Right atrial size was mildly dilated.  5. Moderate pericardial effusion, measures 1.1 cm adjacent to LV lateral wall. No RA/RV collapse seen to suggest tamponade  6. The mitral valve is degenerative. Moderate mitral valve regurgitation. Eccentric posterior directed jet, may be underestimating MR due to eccentric jet  7. The tricuspid valve is abnormal. Tricuspid valve regurgitation is severe.  8. The aortic valve was not well visualized. Aortic valve regurgitation is not visualized. Aortic valve sclerosis/calcification is present, without any evidence of aortic stenosis.  9. The inferior vena cava is dilated in size with <50% respiratory variability, suggesting right atrial pressure of 15 mmHg. FINDINGS  Left Ventricle: Left ventricular ejection fraction, by estimation, is 45 to 50%. The left ventricle has mildly decreased function. The left ventricle demonstrates global hypokinesis. The left ventricular internal cavity size was normal in size. There is  mild left ventricular hypertrophy. Left ventricular diastolic parameters are indeterminate. Right Ventricle: The right ventricular size is moderately enlarged. Right vetricular wall thickness was not well visualized. Right ventricular systolic function is moderately reduced. There is severely elevated pulmonary artery systolic pressure. The tricuspid regurgitant velocity is 4.33 m/s, and with an assumed right atrial pressure of 15 mmHg, the estimated right ventricular systolic pressure is 25.0 mmHg. Left Atrium: Left atrial size was moderately dilated. Right  Atrium: Right atrial size was mildly dilated. Pericardium: A moderately sized pericardial effusion is present. There is no evidence of  cardiac tamponade. Mitral Valve: The mitral valve is degenerative in appearance. Moderate mitral valve regurgitation. Tricuspid Valve: The tricuspid valve is abnormal. Tricuspid valve regurgitation is severe. Aortic Valve: The aortic valve was not well visualized. Aortic valve regurgitation is not visualized. Aortic valve sclerosis/calcification is present, without any evidence of aortic stenosis. Pulmonic Valve: The pulmonic valve was grossly normal. Pulmonic valve regurgitation is not visualized. Aorta: The aortic root and ascending aorta are structurally normal, with no evidence of dilitation. Venous: The inferior vena cava is dilated in size with less than 50% respiratory variability, suggesting right atrial pressure of 15 mmHg. IAS/Shunts: The interatrial septum was not well visualized.  LEFT VENTRICLE PLAX 2D LVIDd:         5.00 cm LVIDs:         3.90 cm LV PW:         1.20 cm LV IVS:        1.20 cm LVOT diam:     1.90 cm LVOT Area:     2.84 cm  RIGHT VENTRICLE            IVC RV S prime:     6.31 cm/s  IVC diam: 2.80 cm TAPSE (M-mode): 0.7 cm LEFT ATRIUM             Index        RIGHT ATRIUM           Index LA diam:        4.80 cm 2.70 cm/m   RA Area:     21.20 cm LA Vol (A2C):   83.2 ml 46.75 ml/m  RA Volume:   68.00 ml  38.21 ml/m LA Vol (A4C):   75.2 ml 42.26 ml/m LA Biplane Vol: 85.9 ml 48.27 ml/m   AORTA Ao Root diam: 3.00 cm Ao Asc diam:  3.10 cm TRICUSPID VALVE TR Peak grad:   75.0 mmHg TR Vmax:        433.00 cm/s  SHUNTS Systemic Diam: 1.90 cm Oswaldo Milian MD Electronically signed by Oswaldo Milian MD Signature Date/Time: 09/15/2021/11:19:20 AM    Final (Updated)    US SCROTUM W/DOPPLER  Result Date: 09/11/2021 CLINICAL DATA:  Scrotal swelling for 1 day EXAM: SCROTAL ULTRASOUND DOPPLER ULTRASOUND OF THE TESTICLES TECHNIQUE: Complete ultrasound examination of the testicles, epididymis, and other scrotal structures was performed. Color and spectral Doppler ultrasound were also  utilized to evaluate blood flow to the testicles. COMPARISON:  None. FINDINGS: Right testicle Measurements: 3.4 x 2.8 x 3.2 cm. No mass or microlithiasis visualized. Left testicle Measurements: 3.5 x 2.8 x 2.7 cm. No mass or microlithiasis visualized. Right epididymis:  Normal in size and appearance. Left epididymis:  Normal in size and appearance. Hydrocele:  Small bilateral hydroceles are noted. Varicocele:  None visualized. Pulsed Doppler interrogation of both testes demonstrates normal low resistance arterial and venous waveforms bilaterally. Note is made of diffuse scrotal edema. IMPRESSION: Normal-appearing testicles. Diffuse scrotal edema Is noted. Electronically Signed   By: Inez Catalina M.D.   On: 09/11/2021 22:53

## 2021-09-16 NOTE — Progress Notes (Addendum)
Progress Note  Patient Name: Jordan [name] Date of Encounter: 09/17/2021  [facility] Cardiologist: Last seen by Dr. Neoma Laming 06/2021 with plan for OP follow-up  Subjective   Overnight notes reviewed regarding patient aggression.  Also was threatening to leave AMA. He's currently lying at 20 degrees in bed without dyspnea, overall poor historian, when asked how he feels, "same old same old." No chest pain. Has had ongoing issues with scrotal edema.  This is his 14th admission since August.  Inpatient Medications    Scheduled Meds:  aspirin  81 mg Oral Daily   calcium acetate  1,334 mg Oral TID WC   carvedilol  3.125 mg Oral BID WC   Chlorhexidine Gluconate Cloth  6 each Topical Q0600   cinacalcet  30 mg Oral Q breakfast   [START ON 09/18/2021] darbepoetin (ARANESP) injection - DIALYSIS  100 mcg Intravenous Q Wed-HD   ferrous sulfate  325 mg Oral BID WC   heparin  5,000 Units Subcutaneous Q8H   isosorbide-hydrALAZINE  1 tablet Oral TID   rosuvastatin  10 mg Oral Daily   sodium chloride flush  3 mL Intravenous Q12H   Continuous Infusions:  PRN Meds: acetaminophen **OR** acetaminophen, albuterol, camphor-menthol, hydrOXYzine, ondansetron **OR** ondansetron (ZOFRAN) IV, oxyCODONE-acetaminophen, senna-docusate   Vital Signs    Vitals:   09/16/21 1130 09/16/21 2208 09/17/21 0439 09/17/21 0939  BP: 116/72 110/75 118/90 137/83  Pulse: 83 88 92 87  Resp: 20  18 20   Temp:  98.5 F (36.9 C) 98.2 F (36.8 C) 98.4 F (36.9 C)  TempSrc:    Oral  SpO2:  94% 91% 98%  Weight:      Height:        Intake/Output Summary (Last 24 hours) at 09/17/2021 1043 Last data filed at 09/17/2021 0910 Gross per 24 hour  Intake 1180 ml  Output --  Net 1180 ml   Last 3 Weights 09/16/2021 09/15/2021 09/12/2021  Weight (lbs) 160 lb 7.9 oz 160 lb 0.9 oz 160 lb 0.9 oz  Weight (kg) 72.8 kg 72.6 kg 72.6 kg  Some encounter information is confidential and restricted. Go to Review  Flowsheets activity to see all data.      Telemetry    N/A - Personally Reviewed  Physical Exam   GEN: No acute distress.   Neck: No JVD Cardiac: RRR, no murmurs, rubs, or gallops.  Respiratory: Mild intermittent wheezing, otherwise clear to auscultation bilaterally. GI: Soft, nontender, non-distended  MS: No edema; No deformity. Neuro:  Nonfocal  Psych: Normal affect   Labs    High Sensitivity Troponin:   Recent Labs  Lab 09/09/21 1412 09/09/21 1641 09/09/21 2150 09/15/21 0650 09/15/21 0843  TROPONINIHS 182* 166* 160* 4,714* 3,471*     Chemistry Recent Labs  Lab 09/11/21 2028 09/15/21 0650 09/15/21 0914  NA 140 140 139  K 4.5 5.1 5.0  CL 100 99 104  CO2 24 18*  --   GLUCOSE 94 96 99  BUN 80* 78* 89*  CREATININE 10.84* 10.61* 10.60*  CALCIUM 9.3 9.1  --   PROT 7.8  --   --   ALBUMIN 3.4*  --   --   AST 26  --   --   ALT 15  --   --   ALKPHOS 120  --   --   BILITOT 1.0  --   --   GFRNONAA 5* 5*  --   ANIONGAP 16* 23*  --     Lipids  Recent Labs  Lab 09/15/21 1935  CHOL 98  TRIG 72  HDL 30*  LDLCALC 54  CHOLHDL 3.3    Hematology Recent Labs  Lab 09/11/21 2028 09/15/21 0650 09/15/21 0914  WBC 7.2 12.3*  --   RBC 2.31* 2.72*  --   HGB 7.4* 8.6* 8.8*  HCT 23.8* 28.5* 26.0*  MCV 103.0* 104.8*  --   MCH 32.0 31.6  --   MCHC 31.1 30.2  --   RDW 16.7* 16.2*  --   PLT 134* 187  --    Thyroid No results for input(s): TSH, FREET4 in the last 168 hours.  BNP Recent Labs  Lab 09/11/21 2028 09/15/21 0650  BNP >4,500.0* 3,856.3*    DDimer No results for input(s): DDIMER in the last 168 hours.   Radiology    ECHOCARDIOGRAM COMPLETE  Result Date: 09/15/2021    ECHOCARDIOGRAM REPORT   Patient Name:   Jordan [name] Date of Exam: 09/15/2021 Medical Rec #:  [id number]        Height:       64.0 in Accession #:    [id number]       Weight:       160.1 lb Date of Birth:  Feb 14, 1966        BSA:          1.780 m Patient Age:    55 years         BP:            122/90 mmHg Patient Gender: M                HR:           86 bpm. Exam Location:  Inpatient Procedure: 2D Echo STAT ECHO Indications:     dyspnea  History:         Patient has prior history of Echocardiogram examinations, most                  recent 07/20/2021. CHF, end stage renal disease; Risk                  Factors:Hypertension and Current Smoker.  Sonographer:     Johny Chess RDCS Referring Phys:  [id number] G BARRETT Diagnosing Phys: Oswaldo Milian MD IMPRESSIONS  1. Left ventricular ejection fraction, by estimation, is 45 to 50%. The left ventricle has mildly decreased function. The left ventricle demonstrates global hypokinesis. There is mild left ventricular hypertrophy. Left ventricular diastolic parameters are indeterminate.  2. Right ventricular systolic function is moderately reduced. The right ventricular size is moderately enlarged. There is severely elevated pulmonary artery systolic pressure. The estimated right ventricular systolic pressure is 42.5 mmHg.  3. Left atrial size was moderately dilated.  4. Right atrial size was mildly dilated.  5. Moderate pericardial effusion, measures 1.1 cm adjacent to LV lateral wall. No RA/RV collapse seen to suggest tamponade  6. The mitral valve is degenerative. Moderate mitral valve regurgitation. Eccentric posterior directed jet, may be underestimating MR due to eccentric jet  7. The tricuspid valve is abnormal. Tricuspid valve regurgitation is severe.  8. The aortic valve was not well visualized. Aortic valve regurgitation is not visualized. Aortic valve sclerosis/calcification is present, without any evidence of aortic stenosis.  9. The inferior vena cava is dilated in size with <50% respiratory variability, suggesting right atrial pressure of 15 mmHg. FINDINGS  Left Ventricle: Left ventricular ejection fraction, by estimation, is 45 to 50%. The left ventricle has mildly  decreased function. The left ventricle demonstrates global  hypokinesis. The left ventricular internal cavity size was normal in size. There is  mild left ventricular hypertrophy. Left ventricular diastolic parameters are indeterminate. Right Ventricle: The right ventricular size is moderately enlarged. Right vetricular wall thickness was not well visualized. Right ventricular systolic function is moderately reduced. There is severely elevated pulmonary artery systolic pressure. The tricuspid regurgitant velocity is 4.33 m/s, and with an assumed right atrial pressure of 15 mmHg, the estimated right ventricular systolic pressure is 09.8 mmHg. Left Atrium: Left atrial size was moderately dilated. Right Atrium: Right atrial size was mildly dilated. Pericardium: A moderately sized pericardial effusion is present. There is no evidence of cardiac tamponade. Mitral Valve: The mitral valve is degenerative in appearance. Moderate mitral valve regurgitation. Tricuspid Valve: The tricuspid valve is abnormal. Tricuspid valve regurgitation is severe. Aortic Valve: The aortic valve was not well visualized. Aortic valve regurgitation is not visualized. Aortic valve sclerosis/calcification is present, without any evidence of aortic stenosis. Pulmonic Valve: The pulmonic valve was grossly normal. Pulmonic valve regurgitation is not visualized. Aorta: The aortic root and ascending aorta are structurally normal, with no evidence of dilitation. Venous: The inferior vena cava is dilated in size with less than 50% respiratory variability, suggesting right atrial pressure of 15 mmHg. IAS/Shunts: The interatrial septum was not well visualized.  LEFT VENTRICLE PLAX 2D LVIDd:         5.00 cm LVIDs:         3.90 cm LV PW:         1.20 cm LV IVS:        1.20 cm LVOT diam:     1.90 cm LVOT Area:     2.84 cm  RIGHT VENTRICLE            IVC RV S prime:     6.31 cm/s  IVC diam: 2.80 cm TAPSE (M-mode): 0.7 cm LEFT ATRIUM             Index        RIGHT ATRIUM           Index LA diam:        4.80 cm 2.70  cm/m   RA Area:     21.20 cm LA Vol (A2C):   83.2 ml 46.75 ml/m  RA Volume:   68.00 ml  38.21 ml/m LA Vol (A4C):   75.2 ml 42.26 ml/m LA Biplane Vol: 85.9 ml 48.27 ml/m   AORTA Ao Root diam: 3.00 cm Ao Asc diam:  3.10 cm TRICUSPID VALVE TR Peak grad:   75.0 mmHg TR Vmax:        433.00 cm/s  SHUNTS Systemic Diam: 1.90 cm Oswaldo Milian MD Electronically signed by Oswaldo Milian MD Signature Date/Time: 09/15/2021/11:19:20 AM    Final (Updated)     Cardiac Studies   TTE 09/15/21: IMPRESSIONS     1. Left ventricular ejection fraction, by estimation, is 45 to 50%. The  left ventricle has mildly decreased function. The left ventricle  demonstrates global hypokinesis. There is mild left ventricular  hypertrophy. Left ventricular diastolic parameters  are indeterminate.   2. Right ventricular systolic function is moderately reduced. The right  ventricular size is moderately enlarged. There is severely elevated  pulmonary artery systolic pressure. The estimated right ventricular  systolic pressure is 11.9 mmHg.   3. Left atrial size was moderately dilated.   4. Right atrial size was mildly dilated.   5. Moderate pericardial effusion, measures 1.1  cm adjacent to LV lateral  wall. No RA/RV collapse seen to suggest tamponade   6. The mitral valve is degenerative. Moderate mitral valve regurgitation.  Eccentric posterior directed jet, may be underestimating MR due to  eccentric jet   7. The tricuspid valve is abnormal. Tricuspid valve regurgitation is  severe.   8. The aortic valve was not well visualized. Aortic valve regurgitation  is not visualized. Aortic valve sclerosis/calcification is present,  without any evidence of aortic stenosis.   9. The inferior vena cava is dilated in size with <50% respiratory  variability, suggesting right atrial pressure of 15 mmHg.   Patient Profile     55 y.o. male with history of ESRD on HD, HTN, emphysema, ETOH abuse, PAF (diagnosed  03/2020), anemia of chronic disease, cardiomyopathy and noncompliance with multiple occurrences leaving AMA. Patient has very frequent readmissions for medical issues and volume overload/elevated troponin in the context of missing dialysis and leaving AMA early. He presented with abdominal pain and distension after missing HD found to have elevated troponin for which Cardiology was consulted.    Assessment & Plan    1. Anasarca with volume overload likely predominantly due to ESRD with noncompliance with HD treatments: multiple AMA discharges - fluid removal per nephrology team  2. Elevated troponin to 4,714 - patient has had longstanding mild LV dysfunction by prior echoes but no prior ischemic evaluation - troponin is chronically elevated but not usually to this degree - this is higher than expected for demand ischemia this admission - cannot exclude underlying ischemic heart disease however agree with prior assessment that given his noncompliance he seems to be a poor candidate for invasive evaluation given concern that he will not comply with procedural instructions for safety or required post-cath medications - patient has been agitated and threatening to leave again today - no chest pain - will review final thoughts with MD as this may need to be revisited with primary cardiologist in the outpatient setting if the patient demonstrates a desire to be an active participant in his health and medical compliance - he states he has no opinion on whether to get heart procedures done - continue ASA, statin, BB  3. Acute on Chronic Combined Systolic and Diastolic Heart Failure: - volume management is via HD, so strict compliance is of utmosed importance - TTE with LVEF 45-50% with global hypokinesis, moderate RV dysfunction, severe pHTN - continue BB, Bidil. Avoid ACEI/ARB/ARNI/spiro due to ESRD on noncompliance with HD which would risk hyperkalemia  4. Severe PHTN - PASP 110mmHg - suspect driven by  underdialyzed state - Consider repeating as outpatient when more euvolemic, this will also re-evaluate his pericardial effusion  5. Pericardial Effusion - Moderate effusion without tamponade - Recommend close f/u with Dr. Humphrey Rolls to re-evaluate as OP With serial TEE   6. Moderate MR - follow clinically as outpatient - continue volume management with HD  7. HTN - improved, continue Bidil and carvedilol - can split Bidil into individual components if cost is an issue  8. Paroxysmal atrial fib (dx 03/2020, episodically noted in occasional admissions) - not on [place] due to noncompliance with medical plans -> although this was referenced by [facility] Team in 06/2021, do not see he was on anticoagulation at that time and patient left AMA without further rx - initial EKG was initially suspicious for AF but there is intermittent P wave activity - not currently on telemetry, but exam with RRR - continue beta blocker - recommend follow up  as OP  For questions or updates, please contact [place] Please consult [email or web address] for contact info under        Signed, Charlie Pitter, PA-C  09/17/2021, 10:43 AM     Patient seen and examined and agree with Melina Copa, PA-C as detailed above.  In brief, the patient is a 55 y.o. male with history of ESRD on HD, HTN, emphysema, ETOH abuse, PAF (diagnosed 03/2020), anemia of chronic disease, cardiomyopathy and noncompliance with multiple hospitalizations where he leaves AMA. Specifically, the patient has very frequent readmissions for volume overload/elevated troponin in the context of missing dialysis and leaving AMA early. On this admission, he presented with abdominal pain and distension after missing HD found to have elevated troponin for which Cardiology was consulted.    Trop elevated to 4700 which is above that during previous admission. BNP also elevated at 4300. TTE with LVEF 45-50%, global hypokinesis, moderate RV dysfunction, moderate pericardial  effusion, moderate MR which is unchanged from prior. Currently, the patient denies chest pain or exertional symptoms. While patient would likely benefit from ischemic evaluation due to mildly reduced EF and significant elevation in troponin, he is not a candidate for invasive procedures/PCI due to significant noncompliance. Would recommend continued medical management at this time. If in the future he becomes more compliant, can consider ischemic evaluation at that time.  GEN: No acute distress.   Neck: Supple Cardiac: RRR, 2/6 systolic murmur Respiratory: rhonchorous GI: Soft, nontender, non-distended  MS: No edema; No deformity. Neuro:  Nonfocal  Psych: Normal affect    Plan: -Patient is not a candidate for invasive procedures/PCI due to significant noncompliance and frequent readmissions where he leaves AMA -If the patient becomes more compliant in the future, can consider ischemic evaluation at that time -Continue ASA 81mg , crestor 10mg  and coreg 3.125mg  BID -Not on ACEI/ARB/ARNI/spiro due to ESRD and noncompliance -Continue bidil 20-37.5mg  BID  -Volume management with HD -Will need follow-up TTE as out-patient for monitoring pericardial effusion/MR -Not on [facility] for pAFib due to compliance issues; recommend follow-up as out-patient and if becomes more compliant, can consider at that [facility]  Cardiology will sign-off. Please call with questions or concerns.   Gwyndolyn Kaufman, MD

## 2021-09-16 NOTE — Progress Notes (Signed)
Progress Note  Patient Name: Jordan [name] Date of Encounter: 09/16/2021  [facility] Cardiologist: None Will M Camnitz  Subjective   Breathing is improved.  Chart reviewed.  Tends to leave [place].  Under dialyzed over the past month.  This is related to the patient's poor compliance.  Inpatient Medications    Scheduled Meds:  aspirin  81 mg Oral Daily   calcitRIOL  1.5 mcg Oral Q M,W,F-HD   calcium acetate  1,334 mg Oral TID WC   carvedilol  3.125 mg Oral BID WC   Chlorhexidine Gluconate Cloth  6 each Topical Q0600   cinacalcet  30 mg Oral Q breakfast   darbepoetin (ARANESP) injection - DIALYSIS  100 mcg Intravenous Q Mon-HD   ferrous sulfate  325 mg Oral BID WC   heparin  5,000 Units Subcutaneous Q8H   isosorbide-hydrALAZINE  1 tablet Oral TID   rosuvastatin  10 mg Oral Daily   sodium chloride flush  3 mL Intravenous Q12H   Continuous Infusions:  PRN Meds: acetaminophen **OR** acetaminophen, albuterol, hydrOXYzine, ondansetron **OR** ondansetron (ZOFRAN) IV, senna-docusate   Vital Signs    Vitals:   09/16/21 1024 09/16/21 1037 09/16/21 1100 09/16/21 1130  BP: 129/84 (!) 133/91 123/75 116/72  Pulse: 86 87 86 83  Resp: 16 19 (!) 22 20  Temp: (!) 97.4 F (36.3 C)     TempSrc: Oral     SpO2: 92%     Weight: 72.8 kg     Height:        Intake/Output Summary (Last 24 hours) at 09/16/2021 1143 Last data filed at 09/16/2021 0800 Gross per 24 hour  Intake 240 ml  Output --  Net 240 ml   Last 3 Weights 09/16/2021 09/15/2021 09/12/2021  Weight (lbs) 160 lb 7.9 oz 160 lb 0.9 oz 160 lb 0.9 oz  Weight (kg) 72.8 kg 72.6 kg 72.6 kg  Some encounter information is confidential and restricted. Go to Review Flowsheets activity to see all data.      Telemetry    Sinus rhythm with PACs and occasional PVCs.- Personally Reviewed  ECG    From the tracing on 09/15/2021, no acute ST-T wave changes are noted.  PACs are noted.  Vertical to slight  rightward axis is noted.- Personally Reviewed  Physical Exam  Currently in dialysis.  Not examined.   Labs    High Sensitivity Troponin:   Recent Labs  Lab 09/09/21 1412 09/09/21 1641 09/09/21 2150 09/15/21 0650 09/15/21 0843  TROPONINIHS 182* 166* 160* 4,714* 3,471*     Chemistry Recent Labs  Lab 09/09/21 1412 09/09/21 2100 09/10/21 0658 09/11/21 2028 09/15/21 0650 09/15/21 0914  NA 140  --  141 140 140 139  K 7.0*   < > 4.1 4.5 5.1 5.0  CL 104  --  101 100 99 104  CO2 17*  --  25 24 18*  --   GLUCOSE 130*  --  130* 94 96 99  BUN 119*  --  64* 80* 78* 89*  CREATININE 14.90*  --  9.21* 10.84* 10.61* 10.60*  CALCIUM 9.3  --  9.2 9.3 9.1  --   PROT 8.9*  --   --  7.8  --   --   ALBUMIN 3.7  --   --  3.4*  --   --   AST 23  --   --  26  --   --   ALT 11  --   --  15  --   --   ALKPHOS 121  --   --  120  --   --   BILITOT 1.3*  --   --  1.0  --   --   GFRNONAA 3*  --  6* 5* 5*  --   ANIONGAP 19*  --  15 16* 23*  --    < > = values in this interval not displayed.    Lipids  Recent Labs  Lab 09/15/21 1935  CHOL 98  TRIG 72  HDL 30*  LDLCALC 54  CHOLHDL 3.3    Hematology Recent Labs  Lab 09/09/21 1412 09/11/21 2028 09/15/21 0650 09/15/21 0914  WBC 9.0 7.2 12.3*  --   RBC 2.37* 2.31* 2.72*  --   HGB 7.5* 7.4* 8.6* 8.8*  HCT 24.3* 23.8* 28.5* 26.0*  MCV 102.5* 103.0* 104.8*  --   MCH 31.6 32.0 31.6  --   MCHC 30.9 31.1 30.2  --   RDW 17.3* 16.7* 16.2*  --   PLT 143* 134* 187  --    Thyroid No results for input(s): TSH, FREET4 in the last 168 hours.  BNP Recent Labs  Lab 09/09/21 1412 09/11/21 2028 09/15/21 0650  BNP >4,500.0* >4,500.0* 3,856.3*    DDimer No results for input(s): DDIMER in the last 168 hours.   Radiology    DG Chest Portable 1 View  Result Date: 09/15/2021 CLINICAL DATA:  Shortness of breath.  Testicular swelling EXAM: PORTABLE CHEST 1 VIEW COMPARISON:  09/12/2021 FINDINGS: Right dialysis catheter remains in place,  unchanged. Enlargement of the cardiopericardial silhouette. Appearance suggest the possibility of pericardial effusion. No confluent airspace opacities, effusions or edema. No acute bony abnormality. IMPRESSION: Enlargement of the cardiopericardial silhouette, similar to prior study. No acute cardiopulmonary disease. Electronically Signed   By: Rolm Baptise M.D.   On: 09/15/2021 06:56   ECHOCARDIOGRAM COMPLETE  Result Date: 09/15/2021    ECHOCARDIOGRAM REPORT   Patient Name:   Jordan [name] Date of Exam: 09/15/2021 Medical Rec #:  [id number]        Height:       64.0 in Accession #:    [id number]       Weight:       160.1 lb Date of Birth:  1966/04/24        BSA:          1.780 m Patient Age:    55 years         BP:           122/90 mmHg Patient Gender: M                HR:           86 bpm. Exam Location:  Inpatient Procedure: 2D Echo STAT ECHO Indications:     dyspnea  History:         Patient has prior history of Echocardiogram examinations, most                  recent 07/20/2021. CHF, end stage renal disease; Risk                  Factors:Hypertension and Current Smoker.  Sonographer:     Johny Chess RDCS Referring Phys:  [id number] G BARRETT Diagnosing Phys: Oswaldo Milian MD IMPRESSIONS  1. Left ventricular ejection fraction, by estimation, is 45 to 50%. The left ventricle has mildly decreased function. The left ventricle demonstrates global hypokinesis. There is  mild left ventricular hypertrophy. Left ventricular diastolic parameters are indeterminate.  2. Right ventricular systolic function is moderately reduced. The right ventricular size is moderately enlarged. There is severely elevated pulmonary artery systolic pressure. The estimated right ventricular systolic pressure is 40.3 mmHg.  3. Left atrial size was moderately dilated.  4. Right atrial size was mildly dilated.  5. Moderate pericardial effusion, measures 1.1 cm adjacent to LV lateral wall. No RA/RV collapse seen to suggest  tamponade  6. The mitral valve is degenerative. Moderate mitral valve regurgitation. Eccentric posterior directed jet, may be underestimating MR due to eccentric jet  7. The tricuspid valve is abnormal. Tricuspid valve regurgitation is severe.  8. The aortic valve was not well visualized. Aortic valve regurgitation is not visualized. Aortic valve sclerosis/calcification is present, without any evidence of aortic stenosis.  9. The inferior vena cava is dilated in size with <50% respiratory variability, suggesting right atrial pressure of 15 mmHg. FINDINGS  Left Ventricle: Left ventricular ejection fraction, by estimation, is 45 to 50%. The left ventricle has mildly decreased function. The left ventricle demonstrates global hypokinesis. The left ventricular internal cavity size was normal in size. There is  mild left ventricular hypertrophy. Left ventricular diastolic parameters are indeterminate. Right Ventricle: The right ventricular size is moderately enlarged. Right vetricular wall thickness was not well visualized. Right ventricular systolic function is moderately reduced. There is severely elevated pulmonary artery systolic pressure. The tricuspid regurgitant velocity is 4.33 m/s, and with an assumed right atrial pressure of 15 mmHg, the estimated right ventricular systolic pressure is 47.4 mmHg. Left Atrium: Left atrial size was moderately dilated. Right Atrium: Right atrial size was mildly dilated. Pericardium: A moderately sized pericardial effusion is present. There is no evidence of cardiac tamponade. Mitral Valve: The mitral valve is degenerative in appearance. Moderate mitral valve regurgitation. Tricuspid Valve: The tricuspid valve is abnormal. Tricuspid valve regurgitation is severe. Aortic Valve: The aortic valve was not well visualized. Aortic valve regurgitation is not visualized. Aortic valve sclerosis/calcification is present, without any evidence of aortic stenosis. Pulmonic Valve: The pulmonic  valve was grossly normal. Pulmonic valve regurgitation is not visualized. Aorta: The aortic root and ascending aorta are structurally normal, with no evidence of dilitation. Venous: The inferior vena cava is dilated in size with less than 50% respiratory variability, suggesting right atrial pressure of 15 mmHg. IAS/Shunts: The interatrial septum was not well visualized.  LEFT VENTRICLE PLAX 2D LVIDd:         5.00 cm LVIDs:         3.90 cm LV PW:         1.20 cm LV IVS:        1.20 cm LVOT diam:     1.90 cm LVOT Area:     2.84 cm  RIGHT VENTRICLE            IVC RV S prime:     6.31 cm/s  IVC diam: 2.80 cm TAPSE (M-mode): 0.7 cm LEFT ATRIUM             Index        RIGHT ATRIUM           Index LA diam:        4.80 cm 2.70 cm/m   RA Area:     21.20 cm LA Vol (A2C):   83.2 ml 46.75 ml/m  RA Volume:   68.00 ml  38.21 ml/m LA Vol (A4C):   75.2 ml 42.26 ml/m LA Biplane Vol:  85.9 ml 48.27 ml/m   AORTA Ao Root diam: 3.00 cm Ao Asc diam:  3.10 cm TRICUSPID VALVE TR Peak grad:   75.0 mmHg TR Vmax:        433.00 cm/s  SHUNTS Systemic Diam: 1.90 cm Oswaldo Milian MD Electronically signed by Oswaldo Milian MD Signature Date/Time: 09/15/2021/11:19:20 AM    Final (Updated)     Cardiac Studies   Surprisingly preserved LV function.  Patient Profile     55 y.o. male  a hx of ESRD on HD w/ compliance issues, HTN, hx ETOH abuse, who is being seen 09/15/2021 for the evaluation of elevated troponin at the request of Dr Gilford Raid.  Assessment & Plan    Elevated troponin: Rule out CAD related versus demand ischemia in setting of end-stage renal disease with volume overload.  Coronary angiography should be considered given multiple risk factors including smoking, end-stage kidney disease, and primary hypertension.  However, management strategy that includes stenting would be a nightmare as 1 would be concerned about the possibility of compliance with dual antiplatelet therapy.  Cath would however rule out the  possibility of surgical coronary disease. Acute on chronic combined systolic and diastolic heart failure: Volume overload in setting of end-stage kidney disease.  Rule out ischemically mediated heart failure.  I would recommend ischemic evaluation if the patient settles down and understands his disease process and has a desire to do better relative to his health.  We should speak to the patient tomorrow if he is still around.  For questions or updates, please contact [place] Please consult [email or web address] for contact info under        Signed, Sinclair Grooms, MD  09/16/2021, 11:43 AM

## 2021-09-17 DIAGNOSIS — N186 End stage renal disease: Secondary | ICD-10-CM

## 2021-09-17 DIAGNOSIS — R778 Other specified abnormalities of plasma proteins: Secondary | ICD-10-CM | POA: Diagnosis not present

## 2021-09-17 DIAGNOSIS — Z992 Dependence on renal dialysis: Secondary | ICD-10-CM

## 2021-09-17 DIAGNOSIS — T82898A Other specified complication of vascular prosthetic devices, implants and grafts, initial encounter: Secondary | ICD-10-CM

## 2021-09-17 DIAGNOSIS — Z9115 Patient's noncompliance with renal dialysis: Secondary | ICD-10-CM

## 2021-09-17 LAB — COMPREHENSIVE METABOLIC PANEL
ALT: 70 U/L — ABNORMAL HIGH (ref 0–44)
AST: 55 U/L — ABNORMAL HIGH (ref 15–41)
Albumin: 2.7 g/dL — ABNORMAL LOW (ref 3.5–5.0)
Alkaline Phosphatase: 106 U/L (ref 38–126)
Anion gap: 15 (ref 5–15)
BUN: 68 mg/dL — ABNORMAL HIGH (ref 6–20)
CO2: 24 mmol/L (ref 22–32)
Calcium: 8 mg/dL — ABNORMAL LOW (ref 8.9–10.3)
Chloride: 101 mmol/L (ref 98–111)
Creatinine, Ser: 10.13 mg/dL — ABNORMAL HIGH (ref 0.61–1.24)
GFR, Estimated: 6 mL/min — ABNORMAL LOW (ref >60)
Glucose, Bld: 123 mg/dL — ABNORMAL HIGH (ref 70–99)
Potassium: 5 mmol/L (ref 3.5–5.1)
Sodium: 140 mmol/L (ref 135–145)
Total Bilirubin: 0.7 mg/dL (ref 0.3–1.2)
Total Protein: 6.5 g/dL (ref 6.5–8.1)

## 2021-09-17 LAB — FERRITIN: Ferritin: 552 ng/mL — ABNORMAL HIGH (ref 24–336)

## 2021-09-17 LAB — CBC WITH DIFFERENTIAL/PLATELET
Abs Immature Granulocytes: 0.11 10*3/uL — ABNORMAL HIGH (ref 0.00–0.07)
Basophils Absolute: 0 10*3/uL (ref 0.0–0.1)
Basophils Relative: 0 %
Eosinophils Absolute: 0.1 10*3/uL (ref 0.0–0.5)
Eosinophils Relative: 1 %
HCT: 20.5 % — ABNORMAL LOW (ref 39.0–52.0)
Hemoglobin: 6.7 g/dL — CL (ref 13.0–17.0)
Immature Granulocytes: 1 %
Lymphocytes Relative: 6 %
Lymphs Abs: 0.6 10*3/uL — ABNORMAL LOW (ref 0.7–4.0)
MCH: 33.5 pg (ref 26.0–34.0)
MCHC: 32.7 g/dL (ref 30.0–36.0)
MCV: 102.5 fL — ABNORMAL HIGH (ref 80.0–100.0)
Monocytes Absolute: 1.1 10*3/uL — ABNORMAL HIGH (ref 0.1–1.0)
Monocytes Relative: 10 %
Neutro Abs: 8.5 10*3/uL — ABNORMAL HIGH (ref 1.7–7.7)
Neutrophils Relative %: 82 %
Platelets: 129 10*3/uL — ABNORMAL LOW (ref 150–400)
RBC: 2 MIL/uL — ABNORMAL LOW (ref 4.22–5.81)
RDW: 17 % — ABNORMAL HIGH (ref 11.5–15.5)
WBC: 10.5 10*3/uL (ref 4.0–10.5)
nRBC: 0 % (ref 0.0–0.2)

## 2021-09-17 LAB — IRON AND TIBC
Iron: 37 ug/dL — ABNORMAL LOW (ref 45–182)
Saturation Ratios: 12 % — ABNORMAL LOW (ref 17.9–39.5)
TIBC: 297 ug/dL (ref 250–450)
UIBC: 260 ug/dL

## 2021-09-17 LAB — RETICULOCYTES
Immature Retic Fract: 29.4 % — ABNORMAL HIGH (ref 2.3–15.9)
RBC.: 2.08 MIL/uL — ABNORMAL LOW (ref 4.22–5.81)
Retic Count, Absolute: 88 10*3/uL (ref 19.0–186.0)
Retic Ct Pct: 4.2 % — ABNORMAL HIGH (ref 0.4–3.1)

## 2021-09-17 LAB — VITAMIN B12: Vitamin B-12: 651 pg/mL (ref 180–914)

## 2021-09-17 LAB — PREPARE RBC (CROSSMATCH)

## 2021-09-17 LAB — BRAIN NATRIURETIC PEPTIDE: B Natriuretic Peptide: 4376.3 pg/mL — ABNORMAL HIGH (ref 0.0–100.0)

## 2021-09-17 LAB — FOLATE: Folate: 12.5 ng/mL (ref >5.9)

## 2021-09-17 LAB — MAGNESIUM: Magnesium: 2.1 mg/dL (ref 1.7–2.4)

## 2021-09-17 MED ORDER — PENTAFLUOROPROP-TETRAFLUOROETH EX AERO: 1.0000 "application " | INHALATION_SPRAY | CUTANEOUS | Status: DC | PRN

## 2021-09-17 MED ORDER — SODIUM CHLORIDE 0.9% IV SOLUTION: Freq: Once | INTRAVENOUS | Status: DC

## 2021-09-17 MED ORDER — CAMPHOR-MENTHOL 0.5-0.5 % EX LOTN
TOPICAL_LOTION | CUTANEOUS | Status: DC | PRN
  Filled 2021-09-17: qty 222

## 2021-09-17 MED ORDER — SODIUM CHLORIDE 0.9 % IV SOLN: 100.0000 mL | INTRAVENOUS | Status: DC | PRN

## 2021-09-17 MED ORDER — ALTEPLASE 2 MG IJ SOLR: 2.0000 mg | Freq: Once | INTRAMUSCULAR | Status: DC | PRN

## 2021-09-17 MED ORDER — LIDOCAINE-PRILOCAINE 2.5-2.5 % EX CREA: 1.0000 "application " | TOPICAL_CREAM | CUTANEOUS | Status: DC | PRN

## 2021-09-17 MED ORDER — OXYCODONE-ACETAMINOPHEN 5-325 MG PO TABS
1.0000 | ORAL_TABLET | Freq: Four times a day (QID) | ORAL | Status: DC | PRN
  Administered 2021-09-17 – 2021-09-19 (×6): 1 via ORAL
  Filled 2021-09-17 (×6): qty 1

## 2021-09-17 MED ORDER — BACITRACIN ZINC 500 UNIT/GM EX OINT
TOPICAL_OINTMENT | Freq: Every day | CUTANEOUS | Status: DC
  Filled 2021-09-17: qty 28.4

## 2021-09-17 MED ORDER — MUPIROCIN 2 % EX OINT
TOPICAL_OINTMENT | CUTANEOUS | Status: AC
  Filled 2021-09-17: qty 22

## 2021-09-17 MED ORDER — LIDOCAINE HCL (PF) 1 % IJ SOLN: 5.0000 mL | INTRAMUSCULAR | Status: DC | PRN

## 2021-09-17 MED ORDER — HYDROCODONE-ACETAMINOPHEN 5-325 MG PO TABS: 1.0000 | ORAL_TABLET | Freq: Four times a day (QID) | ORAL | Status: DC | PRN

## 2021-09-17 MED ORDER — HEPARIN SODIUM (PORCINE) 1000 UNIT/ML DIALYSIS
1000.0000 [IU] | INTRAMUSCULAR | Status: DC | PRN
  Filled 2021-09-17: qty 1

## 2021-09-17 NOTE — Evaluation (Signed)
Occupational Therapy Evaluation Patient Details Name: Jordan [name] MRN: [id number] DOB: 1966-03-23 Today's Date: 09/17/2021   History of Present Illness Pt is a 55 y.o. M who presents 09/15/2021 with continued scrotal swelling, incidental elevation in troponin with no chest pain. Pt has been admittted multiple times to [facility] hospital and signed out AMA. Significant PMH: ESRD, anemia of CKD, COPD on 2L O2, hypertension.   Clinical Impression   Pt not agreeable to any mobility however per PT note, pt independent with mobility. Pt agreeable to managing scrotal edema. Scrotum elevated and ice provided. Will plan to fabricate scrotal sling tomorrow. Pt in agreement with plan.       Recommendations for follow up therapy are one component of a multi-disciplinary discharge planning process, led by the attending physician.  Recommendations may be updated based on patient status, additional functional criteria and insurance authorization.   Follow Up Recommendations  No OT follow up    Assistance Recommended at Discharge Set up Supervision/Assistance  Functional Status Assessment  Patient has had a recent decline in their functional status and/or demonstrates limited ability to make significant improvements in function in a reasonable and predictable amount of time  Equipment Recommendations       Recommendations for Other Services       Precautions / Restrictions Precautions Precautions: None Restrictions Weight Bearing Restrictions: No      Mobility Bed Mobility Overal bed mobility: Independent             General bed mobility comments: per PT note    Transfers Overall transfer level: Independent Equipment used: None                      Balance Overall balance assessment: No apparent balance deficits (not formally assessed)                                         ADL either performed or assessed with clinical judgement   ADL                                          General ADL Comments: significant scrotal edema     Vision         Perception     Praxis      Pertinent Vitals/Pain Pain Assessment: Faces Faces Pain Scale: Hurts even more Breathing: normal Negative Vocalization: occasional moan/groan, low speech, negative/disapproving quality Pain Location: scrotum Pain Descriptors / Indicators: Aching Pain Intervention(s): Limited activity within patient's tolerance     Hand Dominance Right   Extremity/Trunk Assessment         Cervical / Trunk Assessment Cervical / Trunk Assessment: Normal   Communication Communication Communication: No difficulties   Cognition Arousal/Alertness: Awake/alert Behavior During Therapy: Restless;Flat affect;Agitated Overall Cognitive Status: No family/caregiver present to determine baseline cognitive functioning                                 General Comments: most likely at baseline     General Comments  elevated scrotum on 2 towels adn ice pack positioned; pt would not allow use of pillow case as he states it causes itching; possible breakdown anterior area of scrotum - nsg notified  Exercises     Shoulder Instructions      Home Living Family/patient expects to be discharged to:: Private residence Living Arrangements: Alone Available Help at Discharge: Family;Available PRN/intermittently Type of Home: House Home Access: Stairs to enter [employer] of Steps: 4 Entrance Stairs-Rails: None Home Layout: One level     Bathroom Shower/Tub: Teacher, early years/pre: Standard     Home Equipment: Shower seat          Prior Functioning/Environment Prior Level of Function : Independent/Modified Independent             Mobility Comments: uses [facility] for transportation to/from dialysis          OT Problem List: Pain      OT Treatment/Interventions: Self-care/ADL training;Therapeutic  activities;Patient/family education    OT Goals(Current goals can be found in the care plan section) Acute Rehab OT Goals Patient Stated Goal: to sleep OT Goal Formulation: With patient Time For Goal Achievement: 10/01/21 Potential to Achieve Goals: Good  OT Frequency: Min 2X/week   Barriers to D/C:            Co-evaluation              AM-PAC OT "6 Clicks" Daily Activity     Outcome Measure Help from another person eating meals?: None Help from another person taking care of personal grooming?: None Help from another person toileting, which includes using toliet, bedpan, or urinal?: None Help from another person bathing (including washing, rinsing, drying)?: None Help from another person to put on and taking off regular upper body clothing?: None Help from another person to put on and taking off regular lower body clothing?: None 6 Click Score: 24   End of Session Nurse Communication: Other (comment) (plan to address scrotal edema)  Activity Tolerance: Other (comment) (pt limiting participation) Patient left: in bed;with call bell/phone within reach  OT Visit Diagnosis: Pain Pain - part of body:  (scrotum)                Time: 3244-0102 OT Time Calculation (min): 14 min Charges:  OT General Charges $OT Visit: 1 Visit OT Evaluation $OT Eval Moderate Complexity: [place], OT/L   Acute OT Clinical Specialist Acute Rehabilitation Services Pager [phone] Office [phone]   [facility] 09/17/2021, 2:59 PM

## 2021-09-17 NOTE — Progress Notes (Signed)
[facility] Progress Note   Subjective:    Patient seen and examined at bedside. Currently c/o itching throughout body. Also reports small drainage coming from what appears to be a L UE wound. 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 Current Hgb 6.7-ordered 1 unit PRBCs today. Denies SOB, CP, and N/V. He reports tolerating yesterday's HD. Plan for HD 09/18/21.  Objective Vitals:   09/16/21 1130 09/16/21 2208 09/17/21 0439 09/17/21 0939  BP: 116/72 110/75 118/90 137/83  Pulse: 83 88 92 87  Resp: 20  18 20   Temp:  98.5 F (36.9 C) 98.2 F (36.8 C) 98.4 F (36.9 C)  TempSrc:    Oral  SpO2:  94% 91% 98%  Weight:      Height:       Physical Exam General: Older male; ill-appearing; NAD Heart: S1 and S2; No murmurs, gallops, or rubs Lungs: Diminished at bases; No wheezing, rales, or rhonchi Abdomen: Soft and non-tender Extremities:No edema BLLE; noted multiple scabs BLLE Dialysis Access: R perm cath. Of note, patient with L UE wound from removal of infected L AVF. Sutures noted. With small amount clear drainage. No evidence of foul odor, erythema, swelling, or purulent drainage.  Filed Weights   09/15/21 0602 09/16/21 1024  Weight: 72.6 kg 72.8 kg    Intake/Output Summary (Last 24 hours) at 09/17/2021 1258 Last data filed at 09/17/2021 0910 Gross per 24 hour  Intake 1180 ml  Output --  Net 1180 ml    Additional Objective Labs: Basic Metabolic Panel: Recent Labs  Lab 09/11/21 2028 09/15/21 0650 09/15/21 0914 09/17/21 1119  NA 140 140 139 140  K 4.5 5.1 5.0 5.0  CL 100 99 104 101  CO2 24 18*  --  24  GLUCOSE 94 96 99 123*  BUN 80* 78* 89* 68*  CREATININE 10.84* 10.61* 10.60* 10.13*  CALCIUM 9.3 9.1  --  8.0*   Liver Function Tests: Recent Labs  Lab 09/11/21 2028 09/17/21 1119  AST 26 55*  ALT 15 70*  ALKPHOS 120 106  BILITOT 1.0 0.7  PROT 7.8 6.5  ALBUMIN 3.4* 2.7*   No results for input(s): LIPASE, AMYLASE in  the last 168 hours. CBC: Recent Labs  Lab 09/11/21 2028 09/15/21 0650 09/15/21 0914 09/17/21 1119  WBC 7.2 12.3*  --  10.5  NEUTROABS 5.4 9.5*  --  8.5*  HGB 7.4* 8.6* 8.8* 6.7*  HCT 23.8* 28.5* 26.0* 20.5*  MCV 103.0* 104.8*  --  102.5*  PLT 134* 187  --  129*   Blood Culture    Component Value Date/Time   SDES  08/21/2021 1615    PERITONEAL Performed at [facility] [address] [place], [place] [address]    [facility]  08/21/2021 1615    NONE Performed at [facility], [address] [place], [place] [address]    CULT  08/21/2021 1615    NO GROWTH 3 DAYS Performed at [facility], [place] [address] [place],  [address]    REPTSTATUS 08/25/2021 FINAL 08/21/2021 1615    Cardiac Enzymes: No results for input(s): CKTOTAL, CKMB, CKMBINDEX, TROPONINI in the last 168 hours. CBG: No results for input(s): GLUCAP in the last 168 hours. Iron Studies: No results for input(s): IRON, TIBC, TRANSFERRIN, FERRITIN in the last 72 hours. Lab Results  Component Value Date   INR 1.3 (H) 08/10/2021   INR 1.3 (H) 07/30/2021   INR 1.4 (H) 07/17/2021  Studies/Results: No results found.  Medications:   sodium chloride   Intravenous Once   aspirin  81 mg Oral Daily   calcium acetate  1,334 mg Oral TID WC   carvedilol  3.125 mg Oral BID WC   Chlorhexidine Gluconate Cloth  6 each Topical Q0600   cinacalcet  30 mg Oral Q breakfast   [START ON 09/18/2021] darbepoetin (ARANESP) injection - DIALYSIS  100 mcg Intravenous Q Wed-HD   ferrous sulfate  325 mg Oral BID WC   heparin  5,000 Units Subcutaneous Q8H   isosorbide-hydrALAZINE  1 tablet Oral TID   rosuvastatin  10 mg Oral Daily   sodium chloride flush  3 mL Intravenous Q12H    Dialysis Orders: [facility] MWF 4h  450/800  2/2 bath  67kg  TDC  Hep ?  - calcitriol 1.5ug tiw po  Assessment/Plan: Anasarca/ volume overload - chronic vol overload issue die to noncompliance w/ his diet and  HD sessions. HD 09/18/21; max UF as tolerated.  COPD - on home O2 ESRD - on HD MWF. HD 09/18/21.  L UE wound-pt reports clear drainage from wound. Pt followed by [facility]. 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 Delana Meyer. Pt supposed to be scheduled for debridement with wound vac but patient left AMA (according to note). Patient with chronic history of non-compliance with HD. Spoke with Gwenette Greet PA from [facility]. Per [facility], order to remove sutures and monitor wound closely. Wound does not appear infected and ABX not indicated at this time. [facility] to follow during hospitalization. Will need outpatient follow-up with [facility] upon discharge. Anemia ckd - give darbe 100ug on mondays here MBD ckd - cont phoslo, sensipar, vdra Chronic combined CHF   Jordan Poet, NP [facility] 09/17/2021,12:58 PM  LOS: 2 days

## 2021-09-17 NOTE — Plan of Care (Signed)
°  Problem: Pain Managment: Goal: General experience of comfort will improve Outcome: Progressing   Problem: Activity: Goal: Risk for activity intolerance will decrease Outcome: Progressing   Problem: Clinical Measurements: Goal: Ability to maintain clinical measurements within normal limits will improve Outcome: Not Progressing   Problem: Health Behavior/Discharge Planning: Goal: Ability to manage health-related needs will improve Outcome: Not Progressing

## 2021-09-17 NOTE — Progress Notes (Addendum)
PROGRESS NOTE                                                                                                                                                                                                             Patient Demographics:    Jordan [name], is a 55 y.o. male, DOB - 06-02-1966, RKY:[id number]  Outpatient Primary MD for the patient is [facility], [facility], MD    LOS - 2  Admit date - 09/15/2021    Chief Complaint  Patient presents with   Groin Swelling       Brief Narrative (HPI from H&P) -  Jordan [name]  is a 55 y.o. male, Jordan [name] is a 55 y.o. male with medical history significant for end-stage renal disease on hemodialysis (dialysis days are M/W/F), anemia of chronic kidney disease, COPD chronic respiratory failure on 2 L, hypertension who has recently been admitted multiple times to [facility] hospital and signed out AMA now comes to the hospital with continued scrotal swelling.  In [facility], ER he was found to have massive fluid overload, anasarca with scrotal swelling, troponin was checked in the ER as a routine and it was found to be quite elevated.  Nephrology and cardiology were consulted and I was requested to admit the patient for massive fluid overload and incidental elevation of troponin.   Subjective:   Patient in bed, appears comfortable, denies any headache, no fever, no chest pain or pressure, no shortness of breath , no abdominal pain. No new focal weakness.  Agitated this morning threatened to leave AMA counseled to stay.   Assessment  & Plan :    1.  ESRD with history of noncompliance with HD treatments, multiple AMA discharges from [facility] hospital.  He has evidence of massive fluid overload, nephrology has been consulted and undergoing dialysis for fluid removal per nephrology team.  2.  Anasarca with massive scrotal edema.  Fluid removal as in #1, scrotal exam is benign no signs of  infection recent ultrasound from few days ago noted.  Case discussed with urologist [name] on 09/15/2021.  Continue scrotal elevation and fluid removal.  3.  Anemia of chronic disease.  No bleed, some drop on 09/17/21 ( late Labs), anemia panel, 1 unit PRBC with next HD, renal informed.  4.  COPD.  On 2 L as needed oxygen at home.  Supportive care no acute issues  5.  GERD.  On PPI  6.  Incidental elevation in troponin with no chest pain.  EKG nonacute, seen by cardiology underwent stat echocardiogram, EF is 45% with global hypokinesis which is largely unchanged from echocardiogram done in September 2022, he is chest pain-free, have placed him on low-dose aspirin, Coreg and Crestor.  Further management per cardiology, ? Cath per cardiology.  7.  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  Fluid removal through HD, kindly see #6 above.  8.  Hypertension.  Currently on Coreg and BiDil combination.      Condition - Fair  Family Communication  :  none present  Code Status :  Full  Consults  :  Cards, Renal, Urology Dr Milford Cage - phone  PUD Prophylaxis :     Procedures  :     TTE - 1. Left ventricular ejection fraction, by estimation, is 45 to 50%. The left ventricle has mildly decreased function. The left ventricle demonstrates global hypokinesis. There is mild left ventricular hypertrophy. Left ventricular diastolic parameters are indeterminate.  2. Right ventricular systolic function is moderately reduced. The right ventricular size is moderately enlarged. There is severely elevated pulmonary artery systolic pressure. The estimated right ventricular systolic pressure is 75.1 mmHg.  3. Left atrial size was moderately dilated.  4. Right atrial size was mildly dilated.  5. Moderate pericardial effusion, measures 1.1 cm adjacent to LV lateral wall. No RA/RV collapse seen to suggest tamponade  6. The mitral valve is degenerative. Moderate mitral valve regurgitation.  Eccentric posterior directed jet, may be underestimating MR due to eccentric jet  7. The tricuspid valve is abnormal. Tricuspid valve regurgitation is severe.  8. The aortic valve was not well visualized. Aortic valve regurgitation is not visualized. Aortic valve sclerosis/calcification is present, without any evidence of aortic stenosis.  9. The inferior vena cava is dilated in size with <50% respiratory variability, suggesting right atrial pressure of 15 mmHg  CT -  No significant change in large amount of ascites, and diffuse mesenteric and body wall edema. Stable moderate pericardial effusion. New ill-defined nodular opacities in superior left lower lobe and lingula, most likely inflammatory or infectious in etiology. Continued follow-up by chest CT is recommended in 2-3 months. 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   Scrotal US - [facility] -   Normal-appearing testicles. Diffuse scrotal edema Is noted.      Disposition Plan  :    Status is: Inpatient  Remains inpatient appropriate because: Anasarca with massive scrotal edema  DVT Prophylaxis  :    heparin injection 5,000 Units Start: 09/15/21 1400  Lab Results  Component Value Date   PLT 187 09/15/2021    Diet :  Diet Order             Diet renal with fluid restriction Fluid restriction: 1200 mL Fluid; Room service appropriate? Yes; Fluid consistency: Thin  Diet effective now                    Inpatient Medications  Scheduled Meds:  aspirin  81 mg Oral Daily   calcium acetate  1,334 mg Oral TID WC   carvedilol  3.125 mg Oral BID WC   Chlorhexidine  Gluconate Cloth  6 each Topical Q0600   cinacalcet  30 mg Oral Q breakfast   [START ON 09/18/2021] darbepoetin (ARANESP) injection - DIALYSIS  100 mcg Intravenous Q Wed-HD   ferrous sulfate  325 mg Oral BID WC   heparin  5,000 Units Subcutaneous Q8H   isosorbide-hydrALAZINE  1 tablet Oral TID   rosuvastatin  10 mg Oral Daily   sodium chloride flush   3 mL Intravenous Q12H   Continuous Infusions: PRN Meds:.acetaminophen **OR** acetaminophen, albuterol, camphor-menthol, hydrOXYzine, ondansetron **OR** ondansetron (ZOFRAN) IV, oxyCODONE-acetaminophen, senna-docusate  Antibiotics  :    Anti-infectives (From admission, onward)    None        Time Spent in minutes  30   Lala Lund M.D on 09/17/2021 at 10:00 AM  To page go to [email or web address]   [facility] -  Office  [phone]  See all Orders from today for further details    Objective:   Vitals:   09/16/21 1130 09/16/21 2208 09/17/21 0439 09/17/21 0939  BP: 116/72 110/75 118/90 137/83  Pulse: 83 88 92 87  Resp: 20  18 20   Temp:  98.5 F (36.9 C) 98.2 F (36.8 C) 98.4 F (36.9 C)  TempSrc:    Oral  SpO2:  94% 91% 98%  Weight:      Height:        Wt Readings from Last 3 Encounters:  09/16/21 72.8 kg  09/12/21 72.6 kg  09/09/21 72.6 kg     Intake/Output Summary (Last 24 hours) at 09/17/2021 1000 Last data filed at 09/17/2021 0800 Gross per 24 hour  Intake 940 ml  Output --  Net 940 ml     Physical Exam  Awake Alert, No new F.N deficits, agitated affect [place].AT,PERRAL Supple Neck, No JVD,   Symmetrical Chest wall movement, Good air movement bilaterally, CTAB RRR,No Gallops, Rubs or new Murmurs,  +ve B.Sounds, Abd Soft, No tenderness,    2+ body edema, 5+ scrotal edema     RN pressure injury documentation: Pressure Injury 07/14/21 Buttocks Mid Stage 2 -  Partial thickness loss of dermis presenting as a shallow open injury with a red, pink wound bed without slough. open area (Active)  07/14/21 1402  Location: Buttocks  Location Orientation: Mid  Staging: Stage 2 -  Partial thickness loss of dermis presenting as a shallow open injury with a red, pink wound bed without slough.  Wound Description (Comments): open area  Present on Admission: Yes    Data Review:    CBC Recent Labs  Lab 09/11/21 2028 09/15/21 0650 09/15/21 0914  WBC  7.2 12.3*  --   HGB 7.4* 8.6* 8.8*  HCT 23.8* 28.5* 26.0*  PLT 134* 187  --   MCV 103.0* 104.8*  --   MCH 32.0 31.6  --   MCHC 31.1 30.2  --   RDW 16.7* 16.2*  --   LYMPHSABS 0.7 0.8  --   MONOABS 0.8 1.5*  --   EOSABS 0.2 0.1  --   BASOSABS 0.0 0.1  --     Recent Labs  Lab 09/11/21 2028 09/15/21 0650 09/15/21 0914  NA 140 140 139  K 4.5 5.1 5.0  CL 100 99 104  CO2 24 18*  --   GLUCOSE 94 96 99  BUN 80* 78* 89*  CREATININE 10.84* 10.61* 10.60*  CALCIUM 9.3 9.1  --   AST 26  --   --   ALT 15  --   --   [facility]  120  --   --   BILITOT 1.0  --   --   ALBUMIN 3.4*  --   --   BNP >4,500.0* 3,856.3*  --     ------------------------------------------------------------------------------------------------------------------ Recent Labs    09/15/21 1935  CHOL 98  HDL 30*  LDLCALC 54  TRIG 72  CHOLHDL 3.3    Lab Results  Component Value Date   HGBA1C 5.0 04/07/2020   ------------------------------------------------------------------------------------------------------------------ No results for input(s): TSH, T4TOTAL, T3FREE, THYROIDAB in the last 72 hours.  Invalid input(s): FREET3  Cardiac Enzymes No results for input(s): CKMB, TROPONINI, MYOGLOBIN in the last 168 hours.  Invalid input(s): CK ------------------------------------------------------------------------------------------------------------------    Component Value Date/Time   BNP 3,856.3 (H) 09/15/2021 0650    Radiology Reports CT ABDOMEN PELVIS WO CONTRAST  Result Date: 08/20/2021 CLINICAL DATA:  Abdominal pain and distention. End-stage renal disease on dialysis. EXAM: CT ABDOMEN AND PELVIS WITHOUT CONTRAST TECHNIQUE: Multidetector CT imaging of the abdomen and pelvis was performed following the standard protocol without IV contrast. COMPARISON:  05/10/2021 and 05/03/2021 FINDINGS: Lower chest: Multiple ill-defined nodular opacities are seen in the superior left lower lobe and lingula, which are  new since previous studies several months ago, and most likely inflammatory or infectious in etiology. A moderate pericardial effusion shows no significant change. Hepatobiliary: No mass visualized on this unenhanced exam. Gallbladder is unremarkable no evidence of biliary ductal dilatation. Pancreas: No mass or inflammatory process visualized on this unenhanced exam. Spleen:  Within normal limits in size. Adrenals/Urinary tract: Severe diffuse bilateral renal parenchymal atrophy is consistent with chronic end-stage renal disease. No evidence of hydronephrosis. Urinary bladder is nearly completely empty. Stomach/Bowel: Unremarkable. Vascular/Lymphatic: No pathologically enlarged lymph nodes identified. No evidence of abdominal aortic aneurysm. Aortic atherosclerotic calcification noted. Mild dilatation of the right common iliac artery is again seen measuring 1.9 cm. 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 Reproductive:  Stable mild to moderately enlarged prostate. Other: Large amount of ascites and diffuse mesenteric and body wall edema show no significant change. Musculoskeletal: No suspicious bone lesions identified. Diffuse osteosclerosis, consistent with renal osteodystrophy. IMPRESSION: No significant change in large amount of ascites, and diffuse mesenteric and body wall edema. Stable moderate pericardial effusion. New ill-defined nodular opacities in superior left lower lobe and lingula, most likely inflammatory or infectious in etiology. Continued follow-up by chest CT is recommended in 2-3 months. 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 Electronically Signed   By: Marlaine Hind M.D.   On: 08/20/2021 20:20   DG Chest 1 View  Result Date: 09/12/2021 CLINICAL DATA:  Initial evaluation for pulmonary edema. EXAM: CHEST  1 VIEW COMPARISON:  Prior radiograph from 09/09/2021. FINDINGS: Dual-lumen right IJ approach central  venous catheter remains in place with tips overlying the cavoatrial junction. Moderate to advanced cardiomegaly with globular contour of the cardiac silhouette, similar to previous, and likely reflecting persistent pericardial effusion. Mediastinal silhouette normal. Aortic atherosclerosis. Lungs mildly hypoinflated. Diffuse vascular and interstitial prominence consistent with mild diffuse pulmonary interstitial edema. Overall appearance is improved as compared to 09/09/2021. No visible pleural effusion. No focal infiltrates. No pneumothorax. Osseous structures are unchanged. IMPRESSION: 1. Mild diffuse pulmonary interstitial edema, mildly improved as compared to 09/09/2021. 2. Cardiomegaly with persistent globular contour of the cardiac silhouette, likely reflecting persistent pericardial effusion as previously seen. 3.  Aortic Atherosclerosis (ICD10-I70.0). Electronically Signed   By: Jeannine Boga M.D.   On: 09/12/2021 02:48   DG  Chest 2 View  Result Date: 09/02/2021 CLINICAL DATA:  Shortness of breath EXAM: CHEST - 2 VIEW COMPARISON:  08/20/2021 FINDINGS: Right dialysis catheter remains in place, unchanged. Cardiomegaly. No confluent opacities, effusions or edema. No acute bony abnormality. Aortic atherosclerosis. IMPRESSION: Cardiomegaly.  No active disease. Electronically Signed   By: Rolm Baptise M.D.   On: 09/02/2021 22:06   US Paracentesis  Result Date: 08/22/2021 INDICATION: Renal failure on hemodialysis.  Abdominal distension and ascites. EXAM: ULTRASOUND GUIDED  PARACENTESIS MEDICATIONS: Lidocaine 1% subcutaneous COMPLICATIONS: None immediate. PROCEDURE: Informed written consent was obtained from the patient after a discussion of the risks, benefits and alternatives to treatment. A timeout was performed prior to the initiation of the procedure. Initial ultrasound scanning demonstrates a large amount of ascites within the right lower abdominal quadrant. The right lower abdomen was prepped  and draped in the usual sterile fashion. 1% lidocaine was used for local anesthesia. Following this, a 6 Fr Safe-T-Centesis catheter was introduced. An ultrasound image was saved for documentation purposes. The paracentesis was performed. The catheter was removed and a dressing was applied. The patient tolerated the procedure well without immediate post procedural complication. Patient received post-procedure intravenous albumin; see nursing notes for details. FINDINGS: A total of approximately 4 L of clear yellow fluid was removed. Samples were sent to the laboratory as requested by the clinical team. IMPRESSION: Successful ultrasound-guided paracentesis yielding 4 liters of peritoneal fluid. Electronically Signed   By: Lucrezia Europe M.D.   On: 08/22/2021 08:05   DG Chest Portable 1 View  Result Date: 09/15/2021 CLINICAL DATA:  Shortness of breath.  Testicular swelling EXAM: PORTABLE CHEST 1 VIEW COMPARISON:  09/12/2021 FINDINGS: Right dialysis catheter remains in place, unchanged. Enlargement of the cardiopericardial silhouette. Appearance suggest the possibility of pericardial effusion. No confluent airspace opacities, effusions or edema. No acute bony abnormality. IMPRESSION: Enlargement of the cardiopericardial silhouette, similar to prior study. No acute cardiopulmonary disease. Electronically Signed   By: Rolm Baptise M.D.   On: 09/15/2021 06:56   DG Chest Portable 1 View  Result Date: 09/09/2021 CLINICAL DATA:  Shortness of breath EXAM: PORTABLE CHEST 1 VIEW COMPARISON:  Chest x-ray 09/02/2021 FINDINGS: Cardiac silhouette is enlarged similar to previous study. Calcified plaques in the aortic arch. Right-sided central line stable. Pulmonary vascular congestion with no focal consolidation. No pleural effusion or pneumothorax identified. IMPRESSION: Grossly stable cardiomegaly and pericardial effusion as seen on prior CT. Pulmonary vascular congestion. Electronically Signed   By: Ofilia Neas M.D.    On: 09/09/2021 14:24   DG Chest Portable 1 View  Result Date: 08/20/2021 CLINICAL DATA:  Chest pain EXAM: PORTABLE CHEST 1 VIEW COMPARISON:  08/10/2021 FINDINGS: Moderate cardiomegaly. Unchanged position of right chest wall dual lumen catheter. Mild interstitial pulmonary edema is unchanged. No focal consolidation. No pleural effusion or pneumothorax. IMPRESSION: Cardiomegaly and mild interstitial pulmonary edema, unchanged. Electronically Signed   By: Ulyses Jarred M.D.   On: 08/20/2021 19:45   ECHOCARDIOGRAM COMPLETE  Result Date: 09/15/2021    ECHOCARDIOGRAM REPORT   Patient Name:   LAMARIUS DIRR Date of Exam: 09/15/2021 Medical Rec #:  [id number]        Height:       64.0 in Accession #:    [id number]       Weight:       160.1 lb Date of Birth:  13-Jun-1966        BSA:          1.780 m Patient  Age:    66 years         BP:           122/90 mmHg Patient Gender: M                HR:           86 bpm. Exam Location:  Inpatient Procedure: 2D Echo STAT ECHO Indications:     dyspnea  History:         Patient has prior history of Echocardiogram examinations, most                  recent 07/20/2021. CHF, end stage renal disease; Risk                  Factors:Hypertension and Current Smoker.  Sonographer:     Johny Chess RDCS Referring Phys:  [id number] GYJEHU G BARRETT Diagnosing Phys: Oswaldo Milian MD IMPRESSIONS  1. Left ventricular ejection fraction, by estimation, is 45 to 50%. The left ventricle has mildly decreased function. The left ventricle demonstrates global hypokinesis. There is mild left ventricular hypertrophy. Left ventricular diastolic parameters are indeterminate.  2. Right ventricular systolic function is moderately reduced. The right ventricular size is moderately enlarged. There is severely elevated pulmonary artery systolic pressure. The estimated right ventricular systolic pressure is 31.4 mmHg.  3. Left atrial size was moderately dilated.  4. Right atrial size was mildly dilated.   5. Moderate pericardial effusion, measures 1.1 cm adjacent to LV lateral wall. No RA/RV collapse seen to suggest tamponade  6. The mitral valve is degenerative. Moderate mitral valve regurgitation. Eccentric posterior directed jet, may be underestimating MR due to eccentric jet  7. The tricuspid valve is abnormal. Tricuspid valve regurgitation is severe.  8. The aortic valve was not well visualized. Aortic valve regurgitation is not visualized. Aortic valve sclerosis/calcification is present, without any evidence of aortic stenosis.  9. The inferior vena cava is dilated in size with <50% respiratory variability, suggesting right atrial pressure of 15 mmHg. FINDINGS  Left Ventricle: Left ventricular ejection fraction, by estimation, is 45 to 50%. The left ventricle has mildly decreased function. The left ventricle demonstrates global hypokinesis. The left ventricular internal cavity size was normal in size. There is  mild left ventricular hypertrophy. Left ventricular diastolic parameters are indeterminate. Right Ventricle: The right ventricular size is moderately enlarged. Right vetricular wall thickness was not well visualized. Right ventricular systolic function is moderately reduced. There is severely elevated pulmonary artery systolic pressure. The tricuspid regurgitant velocity is 4.33 m/s, and with an assumed right atrial pressure of 15 mmHg, the estimated right ventricular systolic pressure is 97.0 mmHg. Left Atrium: Left atrial size was moderately dilated. Right Atrium: Right atrial size was mildly dilated. Pericardium: A moderately sized pericardial effusion is present. There is no evidence of cardiac tamponade. Mitral Valve: The mitral valve is degenerative in appearance. Moderate mitral valve regurgitation. Tricuspid Valve: The tricuspid valve is abnormal. Tricuspid valve regurgitation is severe. Aortic Valve: The aortic valve was not well visualized. Aortic valve regurgitation is not visualized. Aortic  valve sclerosis/calcification is present, without any evidence of aortic stenosis. Pulmonic Valve: The pulmonic valve was grossly normal. Pulmonic valve regurgitation is not visualized. Aorta: The aortic root and ascending aorta are structurally normal, with no evidence of dilitation. Venous: The inferior vena cava is dilated in size with less than 50% respiratory variability, suggesting right atrial pressure of 15 mmHg. IAS/Shunts: The interatrial septum was not well visualized.  LEFT  VENTRICLE PLAX 2D LVIDd:         5.00 cm LVIDs:         3.90 cm LV PW:         1.20 cm LV IVS:        1.20 cm LVOT diam:     1.90 cm LVOT Area:     2.84 cm  RIGHT VENTRICLE            IVC RV S prime:     6.31 cm/s  IVC diam: 2.80 cm TAPSE (M-mode): 0.7 cm LEFT ATRIUM             Index        RIGHT ATRIUM           Index LA diam:        4.80 cm 2.70 cm/m   RA Area:     21.20 cm LA Vol (A2C):   83.2 ml 46.75 ml/m  RA Volume:   68.00 ml  38.21 ml/m LA Vol (A4C):   75.2 ml 42.26 ml/m LA Biplane Vol: 85.9 ml 48.27 ml/m   AORTA Ao Root diam: 3.00 cm Ao Asc diam:  3.10 cm TRICUSPID VALVE TR Peak grad:   75.0 mmHg TR Vmax:        433.00 cm/s  SHUNTS Systemic Diam: 1.90 cm Oswaldo Milian MD Electronically signed by Oswaldo Milian MD Signature Date/Time: 09/15/2021/11:19:20 AM    Final (Updated)    US SCROTUM W/DOPPLER  Result Date: 09/11/2021 CLINICAL DATA:  Scrotal swelling for 1 day EXAM: SCROTAL ULTRASOUND DOPPLER ULTRASOUND OF THE TESTICLES TECHNIQUE: Complete ultrasound examination of the testicles, epididymis, and other scrotal structures was performed. Color and spectral Doppler ultrasound were also utilized to evaluate blood flow to the testicles. COMPARISON:  None. FINDINGS: Right testicle Measurements: 3.4 x 2.8 x 3.2 cm. No mass or microlithiasis visualized. Left testicle Measurements: 3.5 x 2.8 x 2.7 cm. No mass or microlithiasis visualized. Right epididymis:  Normal in size and appearance. Left epididymis:   Normal in size and appearance. Hydrocele:  Small bilateral hydroceles are noted. Varicocele:  None visualized. Pulsed Doppler interrogation of both testes demonstrates normal low resistance arterial and venous waveforms bilaterally. Note is made of diffuse scrotal edema. IMPRESSION: Normal-appearing testicles. Diffuse scrotal edema Is noted. Electronically Signed   By: Inez Catalina M.D.   On: 09/11/2021 22:53

## 2021-09-17 NOTE — Consult Note (Addendum)
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  The fistula was removed and right TDC was placed by Dr. Delana Meyer on 07/17/21.  The patient was lost to follow up.  He has retained nylon sutures with distal area of clear drainage.   He is at [employer] but is noncompliant per charting.   We have been asked to examine the incision for possible infection.  The patient denise erythema or edema.  He has a working right [place].     He reported to [facility] ED with a CC of scrotal edema.     Current Facility-Administered Medications  Medication Dose Route Frequency Provider Last Rate Last Admin   0.9 %  sodium chloride infusion (Manually program via Guardrails IV Fluids)   Intravenous Once Adelfa Koh, NP       acetaminophen (TYLENOL) tablet 650 mg  650 mg Oral Q6H PRN Thurnell Lose, MD   650 mg at 09/15/21 1831   Or   acetaminophen (TYLENOL) suppository 650 mg  650 mg Rectal Q6H PRN Thurnell Lose, MD       albuterol (PROVENTIL) (2.5 MG/3ML) 0.083% nebulizer solution 2.5 mg  2.5 mg Nebulization Q4H PRN Thurnell Lose, MD       aspirin chewable tablet 81 mg  81 mg Oral Daily Thurnell Lose, MD   81 mg at 09/17/21 4431   bacitracin ointment   Topical Daily Thurnell Lose, MD       calcium acetate (PHOSLO) capsule 1,334 mg  1,334 mg Oral TID WC Thurnell Lose, MD   1,334 mg at 09/17/21 1151   camphor-menthol (SARNA) lotion   Topical PRN Etta Quill, DO       carvedilol (COREG) tablet 3.125 mg  3.125 mg Oral BID WC Thurnell Lose, MD   3.125 mg at 09/17/21 5400   Chlorhexidine Gluconate Cloth 2 % PADS 6 each  6 each Topical  Q0600 Claudia Desanctis, MD   6 each at 09/17/21 [phone]   cinacalcet (SENSIPAR) tablet 30 mg  30 mg Oral Q breakfast Thurnell Lose, MD   30 mg at 09/17/21 0824   [START ON 09/18/2021] Darbepoetin Alfa (ARANESP) injection 100 mcg  100 mcg Intravenous Q Wed-HD Roney Jaffe, MD       ferrous sulfate tablet 325 mg  325 mg Oral BID WC Thurnell Lose, MD   325 mg at 09/17/21 0823   heparin injection 5,000 Units  5,000 Units Subcutaneous Q8H Thurnell Lose, MD   5,000 Units at 09/17/21 1542   hydrOXYzine (ATARAX) tablet 25 mg  25 mg Oral TID PRN Thurnell Lose, MD   25 mg at 09/17/21 1151   isosorbide-hydrALAZINE (BIDIL) 20-37.5 MG per tablet 1 tablet  1 tablet Oral TID Thurnell Lose, MD   1 tablet at 09/17/21 1542   ondansetron (ZOFRAN) tablet 4 mg  4 mg Oral Q6H PRN Thurnell Lose, MD       Or   ondansetron (ZOFRAN) injection 4 mg  4 mg Intravenous Q6H PRN Thurnell Lose, MD       oxyCODONE-acetaminophen (PERCOCET/ROXICET) 5-325 MG  per tablet 1 tablet  1 tablet Oral Q6H PRN Etta Quill, DO   1 tablet at 09/17/21 1151   rosuvastatin (CRESTOR) tablet 10 mg  10 mg Oral Daily Thurnell Lose, MD   10 mg at 09/17/21 5732   senna-docusate (Senokot-S) tablet 1 tablet  1 tablet Oral QHS PRN Thurnell Lose, MD       sodium chloride flush (NS) 0.9 % injection 3 mL  3 mL Intravenous Q12H Thurnell Lose, MD   3 mL at 09/16/21 0907    Pt meds include: Statin :No Betablocker: No ASA: Yes Other anticoagulants/antiplatelets: none  Past Medical History:  Diagnosis Date   Anemia    Aortic atherosclerosis ([place]) 11/12/2019   ED (erectile dysfunction)    Emphysema of lung ([place]) 11/12/2019   ESRD on hemodialysis ([place])    M/W/F in [place], [place]   ETOH abuse    History of blood transfusion    Hypertension    Wears dentures     Past Surgical History:  Procedure Laterality Date   A/V FISTULAGRAM Left 07/15/2021   Procedure: A/V FISTULAGRAM;  Surgeon: Algernon Huxley, MD;   Location: [place] [facility];  Service: Cardiovascular;  Laterality: Left;   BASCILIC VEIN TRANSPOSITION Left 08/07/2016   Procedure: LEFT BASILIC VEIN TRANSPOSITION;  Surgeon: Angelia Mould, MD;  Location: [place];  Service: Vascular;  Laterality: Left;   CLOSED REDUCTION NASAL FRACTURE N/A 08/11/2019   Procedure: CLOSED REDUCTION NASAL FRACTURE WITH STABILIZATION;  Surgeon: Irene Limbo, MD;  Location: [place];  Service: Plastics;  Laterality: N/A;   COLONOSCOPY N/A 08/12/2016   Procedure: COLONOSCOPY;  Surgeon: Otis Brace, MD;  Location: [place];  Service: Gastroenterology;  Laterality: N/A;   COLONOSCOPY WITH PROPOFOL N/A 06/05/2021   Procedure: COLONOSCOPY WITH PROPOFOL;  Surgeon: Sharyn Creamer, MD;  Location: [place];  Service: Gastroenterology;  Laterality: N/A;   DIALYSIS/PERMA CATHETER INSERTION N/A 07/26/2021   Procedure: DIALYSIS/PERMA CATHETER INSERTION;  Surgeon: Algernon Huxley, MD;  Location: [place] [facility];  Service: Cardiovascular;  Laterality: N/A;   ESOPHAGOGASTRODUODENOSCOPY N/A 08/12/2016   Procedure: ESOPHAGOGASTRODUODENOSCOPY (EGD);  Surgeon: Otis Brace, MD;  Location: [place];  Service: Gastroenterology;  Laterality: N/A;   ESOPHAGOGASTRODUODENOSCOPY (EGD) WITH PROPOFOL N/A 06/05/2021   Procedure: ESOPHAGOGASTRODUODENOSCOPY (EGD) WITH PROPOFOL;  Surgeon: Sharyn Creamer, MD;  Location: [place];  Service: Gastroenterology;  Laterality: N/A;   INSERTION OF DIALYSIS CATHETER N/A 08/07/2016   Procedure: INSERTION OF TUNNELED DIALYSIS CATHETER;  Surgeon: Angelia Mould, MD;  Location: [place];  Service: Vascular;  Laterality: N/A;   LIGATION OF ARTERIOVENOUS  FISTULA Left 09/12/2016   Procedure: BANDING OF LEFT  ARTERIOVENOUS  FISTULA;  Surgeon: Angelia Mould, MD;  Location: [place];  Service: Vascular;  Laterality: Left;   LIGATION OF ARTERIOVENOUS  FISTULA Left 07/17/2021   Procedure: LIGATION OF ARTERIOVENOUS  FISTULA;   Surgeon: Katha Cabal, MD;  Location: [facility];  Service: Vascular;  Laterality: Left;  also include placement of temporary dialysis catheter right or left groin   LOWER EXTREMITY INTERVENTION Right 12/02/2018   Procedure: LOWER EXTREMITY INTERVENTION;  Surgeon: Algernon Huxley, MD;  Location: [place] [facility];  Service: Cardiovascular;  Laterality: Right;   POLYPECTOMY  06/05/2021   Procedure: POLYPECTOMY;  Surgeon: Sharyn Creamer, MD;  Location: [facility];  Service: Gastroenterology;;   TEMPORARY DIALYSIS CATHETER Left 07/17/2021   Procedure: TEMPORARY DIALYSIS CATHETER;  Surgeon: Katha Cabal, MD;  Location:  [facility];  Service: Vascular;  Laterality: Left;    Social History Social History   Tobacco Use   Smoking status: Every Day    Packs/day: 0.50    Years: 40.00    Pack years: 20.00    Types: Cigarettes   Smokeless tobacco: Never  Vaping Use   Vaping Use: Never used  Substance Use Topics   Alcohol use: Not Currently    Alcohol/week: 21.0 standard drinks    Types: 21 Cans of beer per week    Comment: last drink 6 months ago   Drug use: No    Family History Family History  Problem Relation Age of Onset   Diabetes Mother    Kidney failure Mother    Healthy Father    Kidney failure Brother    Healthy Sister    Kidney disease Daughter    Post-traumatic stress disorder Neg Hx    Bladder Cancer Neg Hx    Kidney cancer Neg Hx     Allergies  Allergen Reactions   Lisinopril Swelling     REVIEW OF SYSTEMS  General: [ ]  Weight loss, [ ]  Fever, [ ]  chills Neurologic: [ ]  Dizziness, [ ]  Blackouts, [ ]  Seizure [ ]  Stroke, [ ]  "Mini stroke", [ ]  Slurred speech, [ ]  Temporary blindness; [ ]  weakness in arms or legs, [ ]  Hoarseness [ ]  Dysphagia Cardiac: [ ]  Chest pain/pressure, [ ]  Shortness of breath at rest [ ]  Shortness of breath with exertion, [ ]  Atrial fibrillation or irregular heartbeat  Vascular: [ ]  Pain in legs with walking, [ ]  Pain in legs at rest,  [ ]  Pain in legs at night,  [ ]  Non-healing ulcer, [ ]  Blood clot in vein/DVT,   Pulmonary: [ ]  Home oxygen, [ ]  Productive cough, [ ]  Coughing up blood, [ ]  Asthma,  [ ]  Wheezing [ ]  COPD Musculoskeletal:  [ ]  Arthritis, [ ]  Low back pain, [ ]  Joint pain Hematologic: [ ]  Easy Bruising, [x ] Anemia; [ ]  Hepatitis Gastrointestinal: [ ]  Blood in stool, [ ]  Gastroesophageal Reflux/heartburn, Urinary: [ ]  chronic Kidney disease, [x ] on HD - [ ]  MWF or [x ] TTHS, [ ]  Burning with urination, [ ]  Difficulty urinating Skin: [ ]  Rashes, [ ]  Wounds Psychological: [ ]  Anxiety, [ ]  Depression  Physical Examination Vitals:   09/16/21 1130 09/16/21 2208 09/17/21 0439 09/17/21 0939  BP: 116/72 110/75 118/90 137/83  Pulse: 83 88 92 87  Resp: 20  18 20   Temp:  98.5 F (36.9 C) 98.2 F (36.8 C) 98.4 F (36.9 C)  TempSrc:    Oral  SpO2:  94% 91% 98%  Weight:      Height:       Body mass index is 27.55 kg/m.  General:  WDWN in NAD HENT: WNL Eyes: Pupils equal Pulmonary: normal non-labored breathing , without Rales, rhonchi,  wheezing Cardiac: RRR, without  Murmurs, rubs or gallops; No carotid bruits Abdomen: soft, NT, no masses Skin: no rashes, ulcers noted;  no Gangrene , no cellulitis; no open wounds;   Vascular Exam/Pulses:palpable radial pulses B UE. Left UE with retained nylon sutures, no erythema, no edema.  Manipulation or the incision produced clear fluid expression.  No purulence.     Musculoskeletal: no muscle wasting or atrophy; no edema  Neurologic: A&O X 3; Appropriate Affect ;  SENSATION: normal; MOTOR FUNCTION: 5/5 Symmetric Speech is fluent/normal   Significant Diagnostic Studies: CBC Lab Results  Component  Value Date   WBC 10.5 09/17/2021   HGB 6.7 (LL) 09/17/2021   HCT 20.5 (L) 09/17/2021   MCV 102.5 (H) 09/17/2021   PLT 129 (L) 09/17/2021    BMET    Component Value Date/Time   NA 140 09/17/2021 1119   K 5.0 09/17/2021 1119   CL 101 09/17/2021 1119    CO2 24 09/17/2021 1119   GLUCOSE 123 (H) 09/17/2021 1119   BUN 68 (H) 09/17/2021 1119   CREATININE 10.13 (H) 09/17/2021 1119   CALCIUM 8.0 (L) 09/17/2021 1119   GFRNONAA 6 (L) 09/17/2021 1119   GFRAA 4 (L) 06/20/2020 0414   Estimated Creatinine Clearance: 7.6 mL/min (A) (by C-G formula based on SCr of 10.13 mg/dL (H)).  COAG Lab Results  Component Value Date   INR 1.3 (H) 08/10/2021   INR 1.3 (H) 07/30/2021   INR 1.4 (H) 07/17/2021       ASSESSMENT/PLAN:  ESRD reported non compliant S/P left UE fistula with infected ulcer removed by Dr. Delana Meyer 07/17/21 I have asked his RN to remove the sutures and place a dry dressing PRN.  He has a working [facility].  There doesn't appear to be any sign of active infection in the left UE.  No vascular intervention is indicated at this time.     Roxy Horseman 09/17/2021 4:15 PM  I agree with the above.  I have seen and evaluated the patient.  No obvious infection from the prior surgical site in the left arm.  Sutures will be removed today.  Annamarie Major

## 2021-09-17 NOTE — TOC Initial Note (Signed)
Transition of Care [facility]) - Initial/Assessment Note    Patient Details  Name: Jordan [name] MRN: [id number] Date of Birth: [birth year]  Transition of Care [facility]) CM/SW Contact:    Tom-[name], Renea Ee, RN Phone Number: 09/17/2021, 4:22 PM  Clinical Narrative:                  Transition of Care (TOC) Screening Note   Patient Details  Name: Jordan [name] Date of Birth: [birth year]   Transition of Care [facility]) CM/SW Contact:    Tom-[name], Renea Ee, RN Phone Number: 09/17/2021, 4:22 PM  Transition of Care Department (TOC) has reviewed patient and no TOC needs have been identified at this time, No followup from PT/OT. We will continue to monitor patient advancement through interdisciplinary progression rounds. If new patient transition needs arise, please place a TOC consult.    Expected Discharge Plan: Home/Self Care Barriers to Discharge: Continued Medical Work up   Patient Goals and CMS Choice Patient states their goals for this hospitalization and ongoing recovery are:: To go home CMS Medicare.gov Compare Post Acute Care list provided to:: Patient Choice offered to / list presented to : NA  Expected Discharge Plan and Services Expected Discharge Plan: Home/Self Care   Discharge Planning Services: CM Consult   Living arrangements for the past 2 months: Single Family Home                           HH Arranged: NA [place] Agency: NA        Prior Living Arrangements/Services Living arrangements for the past 2 months: Single Family Home   Patient language and need for interpreter reviewed:: Yes Do you feel safe going back to the place where you live?: Yes      Need for Family Participation in Patient Care: Yes (Comment) Care giver support system in place?: Yes (comment)   Criminal Activity/Legal Involvement Pertinent to Current Situation/Hospitalization: No - Comment as needed  Activities of Daily Living Home Assistive Devices/Equipment:  None ADL Screening (condition at time of admission) Patient's cognitive ability adequate to safely complete daily activities?: Yes Is the patient deaf or have difficulty hearing?: No Does the patient have difficulty seeing, even when wearing glasses/contacts?: No Does the patient have difficulty concentrating, remembering, or making decisions?: No Patient able to express need for assistance with ADLs?: Yes Does the patient have difficulty dressing or bathing?: No Independently performs ADLs?: Yes (appropriate for developmental age) Does the patient have difficulty walking or climbing stairs?: No Weakness of Legs: None Weakness of Arms/Hands: None  Permission Sought/Granted Permission sought to share information with : Case Manager, Family Supports Permission granted to share information with : Yes, Verbal Permission Granted              Emotional Assessment Appearance:: Appears stated age Attitude/Demeanor/Rapport: Engaged, Gracious Affect (typically observed): Appropriate, Calm, Hopeful, Accepting Orientation: : Oriented to Self, Oriented to Place, Oriented to  Time, Oriented to Situation Alcohol / Substance Use: Not Applicable Psych Involvement: No (comment)  Admission diagnosis:  ESRD (end stage renal disease) ([place]) [N18.6] Patient Active Problem List   Diagnosis Date Noted   ESRD (end stage renal disease) ([place]) 09/15/2021   Scrotal swelling 09/12/2021   Scabies 08/11/2021   Cellulitis, wound, post-operative 08/10/2021   Surgical wound infection 08/07/2021   Infection due to Serratia marcescens 08/07/2021   Wound infection after surgery 08/07/2021   CAP (community acquired pneumonia) 07/30/2021   ABLA (acute  blood loss anemia) 07/17/2021   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 ([place]) 07/17/2021   AV fistula infection, 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/17/2021   AV fistula thrombosis, 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/17/2021   Pressure injury of skin 07/15/2021   Acute  on chronic respiratory failure with hypoxemia ([place]) 07/12/2021   Ascites    Protein-calorie malnutrition, severe 06/05/2021   Hypervolemia associated with renal insufficiency 06/02/2021   Symptomatic anemia 05/28/2021   Acute on chronic diastolic CHF (congestive heart failure) ([place]) 05/28/2021   Hyperkalemia 05/28/2021   Pulmonary edema 04/29/2021   Pruritus 04/29/2021   COVID-19 virus infection 04/29/2021   Thrombocytopenia ([place]) 04/08/2021   COPD (chronic obstructive pulmonary disease) ([place]) 04/08/2021   CHF (congestive heart failure) ([place]) 03/25/2021   PAF (paroxysmal atrial fibrillation) ([place]) 03/20/2021   Chronic diastolic CHF (congestive heart failure) ([place]) 03/20/2021   Alcohol abuse 03/20/2021   Vision loss of left eye 03/20/2021   SOB (shortness of breath) 03/20/2021   Shortness of breath 03/10/2021   Abscess of buttock 03/02/2021   Fluid overload 03/02/2021   Multifocal pneumonia 01/13/2021   Abdominal pain 06/19/2020   Current mild episode of major depressive disorder without prior episode ([place]) 04/09/2020   Atrial fibrillation with RVR ([place]) 04/04/2020   Non-compliance with renal dialysis ([place]) 03/21/2020   Acute CHF (congestive heart failure) ([place]) 03/12/2020   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 ([place]) 02/03/2020   Acute on chronic combined systolic and diastolic CHF (congestive heart failure) ([place]) 02/03/2020   Acute on chronic respiratory failure with hypoxia ([place]) 11/12/2019   Aortic atherosclerosis ([place]) 11/12/2019   Emphysema of lung ([place]) 11/12/2019   Volume overload 10/05/2019   Elevated troponin 05/17/2018   Anemia in ESRD (end-stage renal disease) ([place]) 05/17/2018   ESRD on dialysis ([place]) 05/26/2017   Essential hypertension 05/26/2017   Moderate protein-calorie malnutrition ([place]) 08/13/2016   Secondary hyperparathyroidism of renal origin ([place]) 08/13/2016   Tobacco abuse 08/04/2016   Hypertensive emergency    PCP:  Hoy Register,  MD Pharmacy:   [facility], [place] - [address] [place] [address] Phone: [phone] Fax: [phone]  [facility], [place] - [address] [employer] [address], [address] [place] [address] Phone: [phone] Fax: [phone]     Social Determinants of Health (SDOH) Interventions    Readmission Risk Interventions Readmission Risk Prevention Plan 07/22/2021  Transportation Screening Complete  Medication Review ([place]) Complete  PCP or Specialist appointment within 3-5 days of discharge Complete  SW Recovery Care/Counseling Consult Complete  [place] Not Applicable  Some recent data might be hidden

## 2021-09-17 NOTE — Progress Notes (Signed)
Patient called this RN to room. Upon entering,seen pt. sitting in the chair, asked what he needs, patient  unprovoked started cussing/ verbally aggressive, throwing tele monitor/ room phone on the floor, complaining of scrotal pain, staff not doing enough for his scrotal swelling, wants to leave AMA and wants to see the doctor asap. Patient become more aggressive while trying to explain and educate patient. This RN called CN Nikki for safety and helped with de-escalating situation. Secure chat night oncall provider for pain med and made him aware.  after further explanation, patient agreeable to stay in, PRN pain medication, getting back to bed and elevate scrotum with towels. Also given snacks and warm blanket.

## 2021-09-17 NOTE — Plan of Care (Signed)
°  Problem: Fluid Volume: Goal: Compliance with measures to maintain balanced fluid volume will improve Outcome: Progressing   Problem: Health Behavior/Discharge Planning: Goal: Ability to manage health-related needs will improve Outcome: Progressing   Problem: Nutritional: Goal: Ability to make healthy dietary choices will improve Outcome: Progressing   Problem: Clinical Measurements: Goal: Complications related to the disease process, condition or treatment will be avoided or minimized Outcome: Progressing   Problem: Education: Goal: Knowledge of disease and its progression will improve Outcome: Progressing   Problem: Fluid Volume: Goal: Compliance with measures to maintain balanced fluid volume will improve Outcome: Progressing   Problem: Nutritional: Goal: Ability to make healthy dietary choices will improve Outcome: Progressing   Problem: Clinical Measurements: Goal: Complications related to the disease process, condition or treatment will be avoided or minimized Outcome: Progressing

## 2021-09-17 NOTE — Progress Notes (Signed)
Patient refused to have lab drawn for type and screen. Explained to patient importance of receiving blood but not agreeable.

## 2021-09-17 NOTE — Progress Notes (Signed)
Patient would like to have his Blood transfusion during HD tomorrow. oncall MD Alcario Drought made aware. Patient also refused VS per NT.agreeable to take his HS medication

## 2021-09-17 NOTE — Progress Notes (Signed)
CRITICAL LAB VALUE: Hgb 6.7 MD notified and aware. To transfuse PRBC next HD.

## 2021-09-17 NOTE — Care Management Important Message (Signed)
Important Message  Patient Details  Name: Jordan [name] MRN: [id number] Date of Birth: [birth year]   Medicare Important Message Given:  Yes     Orbie Pyo 09/17/2021, 3:26 PM

## 2021-09-18 LAB — CBC WITH DIFFERENTIAL/PLATELET
Abs Immature Granulocytes: 0.12 10*3/uL — ABNORMAL HIGH (ref 0.00–0.07)
Basophils Absolute: 0 10*3/uL (ref 0.0–0.1)
Basophils Relative: 0 %
Eosinophils Absolute: 0.2 10*3/uL (ref 0.0–0.5)
Eosinophils Relative: 1 %
HCT: 20.4 % — ABNORMAL LOW (ref 39.0–52.0)
Hemoglobin: 6.6 g/dL — CL (ref 13.0–17.0)
Immature Granulocytes: 1 %
Lymphocytes Relative: 7 %
Lymphs Abs: 0.7 10*3/uL (ref 0.7–4.0)
MCH: 33.3 pg (ref 26.0–34.0)
MCHC: 32.4 g/dL (ref 30.0–36.0)
MCV: 103 fL — ABNORMAL HIGH (ref 80.0–100.0)
Monocytes Absolute: 1.3 10*3/uL — ABNORMAL HIGH (ref 0.1–1.0)
Monocytes Relative: 12 %
Neutro Abs: 8.7 10*3/uL — ABNORMAL HIGH (ref 1.7–7.7)
Neutrophils Relative %: 79 %
Platelets: 128 10*3/uL — ABNORMAL LOW (ref 150–400)
RBC: 1.98 MIL/uL — ABNORMAL LOW (ref 4.22–5.81)
RDW: 17 % — ABNORMAL HIGH (ref 11.5–15.5)
WBC: 11 10*3/uL — ABNORMAL HIGH (ref 4.0–10.5)
nRBC: 0 % (ref 0.0–0.2)

## 2021-09-18 LAB — COMPREHENSIVE METABOLIC PANEL
ALT: 57 U/L — ABNORMAL HIGH (ref 0–44)
AST: 40 U/L (ref 15–41)
Albumin: 2.7 g/dL — ABNORMAL LOW (ref 3.5–5.0)
Alkaline Phosphatase: 98 U/L (ref 38–126)
Anion gap: 14 (ref 5–15)
BUN: 80 mg/dL — ABNORMAL HIGH (ref 6–20)
CO2: 23 mmol/L (ref 22–32)
Calcium: 7.4 mg/dL — ABNORMAL LOW (ref 8.9–10.3)
Chloride: 103 mmol/L (ref 98–111)
Creatinine, Ser: 11.13 mg/dL — ABNORMAL HIGH (ref 0.61–1.24)
GFR, Estimated: 5 mL/min — ABNORMAL LOW (ref >60)
Glucose, Bld: 91 mg/dL (ref 70–99)
Potassium: 5.1 mmol/L (ref 3.5–5.1)
Sodium: 140 mmol/L (ref 135–145)
Total Bilirubin: 0.9 mg/dL (ref 0.3–1.2)
Total Protein: 6.4 g/dL — ABNORMAL LOW (ref 6.5–8.1)

## 2021-09-18 LAB — BRAIN NATRIURETIC PEPTIDE: B Natriuretic Peptide: 3818.1 pg/mL — ABNORMAL HIGH (ref 0.0–100.0)

## 2021-09-18 LAB — PHOSPHORUS: Phosphorus: 6.4 mg/dL — ABNORMAL HIGH (ref 2.5–4.6)

## 2021-09-18 LAB — MAGNESIUM: Magnesium: 2.2 mg/dL (ref 1.7–2.4)

## 2021-09-18 NOTE — Progress Notes (Signed)
PROGRESS NOTE    Jordan [name]  BWG:[id number] DOB: Jun 23, 1966 DOA: 09/15/2021 PCP: Hoy Register, MD   Brief Narrative: 55 year old with past medical history significant for ESRD on hemodialysis Monday Wednesday and Friday, anemia of chronic kidney disease, COPD on chronic 2 L of oxygen, hypertension, who presents with massive fluid overload, anasarca with scrotal swelling, mild troponin elevation.  Nephrology consulted and cardiology were consulted and patient was admitted for management of volume overload.   Assessment & Plan:   Active Problems:   ESRD (end stage renal disease) ([place])   1-ESRD on hemodialysis, history of noncompliance with treatments, multiple AMA discharge from [facility] hospital. Admitted with volume overload, underwent hemodialysis today.  2-anasarca with massive scrotal edema: Continue with volume management with hemodialysis.  Continue with scrotal elevation and fluid removal.  3-anemia of chronic disease: Patient received 1 unit of packed red blood cell today with hemodialysis plan to repeat hemoglobin tomorrow  COPD chronic 2 L of oxygen continue with supportive care  GERD: Continue with PPI Incidental elevation of troponin, no chest pain.  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 open area (Active)  07/14/21 1402  Location: Buttocks  Location Orientation: Mid  Staging: Stage 2 -  Partial thickness loss of dermis presenting as a shallow open injury with a red, pink wound bed without slough.  Wound Description (Comments): open area  Present on Admission: Yes      Estimated body mass index is 26.34 kg/m as  calculated from the following:   Height as of this encounter: 5\' 4"  (1.626 m).   Weight as of this encounter: 69.6 kg.   DVT prophylaxis: SCDs Heparin Code Status:  Family care discussed with patient communication: Disposition Plan:  Status is: Inpatient  Remains inpatient appropriate because: Patient admitted with volume overload requiring hemodialysis        Consultants:  Nephrology   Procedures:  HD  Antimicrobials:    Subjective: Seen in HD, denies worsening cough   Objective: Vitals:   09/18/21 1200 09/18/21 1230 09/18/21 1244 09/18/21 1251  BP: 100/74 123/66 123/68 123/87  Pulse: 95 78 79 80  Resp: 15  15   Temp:   97.7 F (36.5 C)   TempSrc:   Oral   SpO2:   98%   Weight:   69.6 kg   Height:        Intake/Output Summary (Last 24 hours) at 09/18/2021 1438 Last data filed at 09/18/2021 1244 Gross per 24 hour  Intake 1215 ml  Output 2822 ml  Net -1607 ml   Filed Weights   09/16/21 1024 09/18/21 0926 09/18/21 1244  Weight: 72.8 kg 71.1 kg 69.6 kg    Examination:  General exam: Appears calm and comfortable  Respiratory system: Clear to auscultation. Respiratory effort normal. Cardiovascular system: S1 & S2 heard, RRR.  Gastrointestinal system: Abdomen is nondistended, soft and nontender. No organomegaly or masses felt. Normal bowel sounds heard. Central nervous system: Alert and oriented.  Extremities: Symmetric 5 x 5 power.    Data Reviewed: I have personally reviewed following labs and imaging studies  CBC: Recent Labs  Lab 09/11/21 2028 09/15/21 0650 09/15/21 0914 09/17/21 1119  09/18/21 0836  WBC 7.2 12.3*  --  10.5 11.0*  NEUTROABS 5.4 9.5*  --  8.5* 8.7*  HGB 7.4* 8.6* 8.8* 6.7* 6.6*  HCT 23.8* 28.5* 26.0* 20.5* 20.4*  MCV 103.0* 104.8*  --  102.5* 103.0*  PLT 134* 187  --  129* 759*   Basic Metabolic Panel: Recent Labs  Lab 09/11/21 2028 09/15/21 0650 09/15/21 0914 09/17/21 1119 09/18/21 0836  NA 140 140 139 140 140   K 4.5 5.1 5.0 5.0 5.1  CL 100 99 104 101 103  CO2 24 18*  --  24 23  GLUCOSE 94 96 99 123* 91  BUN 80* 78* 89* 68* 80*  CREATININE 10.84* 10.61* 10.60* 10.13* 11.13*  CALCIUM 9.3 9.1  --  8.0* 7.4*  MG  --   --   --  2.1 2.2  PHOS  --   --   --   --  6.4*   GFR: Estimated Creatinine Clearance: 6.4 mL/min (A) (by C-G formula based on SCr of 11.13 mg/dL (H)). Liver Function Tests: Recent Labs  Lab 09/11/21 2028 09/17/21 1119 09/18/21 0836  AST 26 55* 40  ALT 15 70* 57*  ALKPHOS 120 106 98  BILITOT 1.0 0.7 0.9  PROT 7.8 6.5 6.4*  ALBUMIN 3.4* 2.7* 2.7*   No results for input(s): LIPASE, AMYLASE in the last 168 hours. No results for input(s): AMMONIA in the last 168 hours. Coagulation Profile: No results for input(s): INR, PROTIME in the last 168 hours. Cardiac Enzymes: No results for input(s): CKTOTAL, CKMB, CKMBINDEX, TROPONINI in the last 168 hours. BNP (last 3 results) No results for input(s): PROBNP in the last 8760 hours. HbA1C: No results for input(s): HGBA1C in the last 72 hours. CBG: No results for input(s): GLUCAP in the last 168 hours. Lipid Profile: Recent Labs    09/15/21 1935  CHOL 98  HDL 30*  LDLCALC 54  TRIG 72  CHOLHDL 3.3   Thyroid Function Tests: No results for input(s): TSH, T4TOTAL, FREET4, T3FREE, THYROIDAB in the last 72 hours. Anemia Panel: Recent Labs    09/17/21 1119  VITAMINB12 651  FOLATE 12.5  FERRITIN 552*  TIBC 297  IRON 37*  RETICCTPCT 4.2*   Sepsis Labs: No results for input(s): PROCALCITON, LATICACIDVEN in the last 168 hours.  Recent Results (from the past 240 hour(s))  Resp Panel by RT-PCR (Flu A&B, Covid) Nasopharyngeal Swab     Status: None   Collection Time: 09/10/21  7:40 AM   Specimen: Nasopharyngeal Swab; Nasopharyngeal(NP) swabs in vial transport medium  Result Value Ref Range Status   SARS Coronavirus 2 by RT PCR NEGATIVE NEGATIVE Final    Comment: (NOTE) SARS-CoV-2 target nucleic acids are NOT  DETECTED.  The SARS-CoV-2 RNA is generally detectable in upper respiratory specimens during the acute phase of infection. The lowest concentration of SARS-CoV-2 viral copies this assay can detect is 138 copies/mL. A negative result does not preclude SARS-Cov-2 infection and should not be used as the sole basis for treatment or other patient management decisions. A negative result may occur with  improper specimen collection/handling, submission of specimen other than nasopharyngeal swab, presence of viral mutation(s) within the areas targeted by this assay, and inadequate number of viral copies(<138 copies/mL). A negative result must be combined with clinical observations, patient history, and epidemiological information. The expected result is Negative.  Fact Sheet for Patients:  EntrepreneurPulse.com.au  Fact Sheet for Healthcare Providers:  IncredibleEmployment.be  This test is no t yet approved or  cleared by the [place] and  has been authorized for detection and/or diagnosis of SARS-CoV-2 by FDA under an Emergency Use Authorization (EUA). This EUA will remain  in effect (meaning this test can be used) for the duration of the COVID-19 declaration under Section 564(b)(1) of the Act, 21 U.S.C.section 360bbb-3(b)(1), unless the authorization is terminated  or revoked sooner.       Influenza A by PCR NEGATIVE NEGATIVE Final   Influenza B by PCR NEGATIVE NEGATIVE Final    Comment: (NOTE) The Xpert Xpress SARS-CoV-2/FLU/RSV plus assay is intended as an aid in the diagnosis of influenza from Nasopharyngeal swab specimens and should not be used as a sole basis for treatment. Nasal washings and aspirates are unacceptable for Xpert Xpress SARS-CoV-2/FLU/RSV testing.  Fact Sheet for Patients: EntrepreneurPulse.com.au  Fact Sheet for Healthcare Providers: IncredibleEmployment.be  This test is not yet  approved or cleared by the [place] FDA and has been authorized for detection and/or diagnosis of SARS-CoV-2 by FDA under an Emergency Use Authorization (EUA). This EUA will remain in effect (meaning this test can be used) for the duration of the COVID-19 declaration under Section 564(b)(1) of the Act, 21 U.S.C. section 360bbb-3(b)(1), unless the authorization is terminated or revoked.  Performed at [facility], [address] [place], [place] [address]   Resp Panel by RT-PCR (Flu A&B, Covid) Nasopharyngeal Swab     Status: None   Collection Time: 09/12/21  7:10 AM   Specimen: Nasopharyngeal Swab; Nasopharyngeal(NP) swabs in vial transport medium  Result Value Ref Range Status   SARS Coronavirus 2 by RT PCR NEGATIVE NEGATIVE Final    Comment: (NOTE) SARS-CoV-2 target nucleic acids are NOT DETECTED.  The SARS-CoV-2 RNA is generally detectable in upper respiratory specimens during the acute phase of infection. The lowest concentration of SARS-CoV-2 viral copies this assay can detect is 138 copies/mL. A negative result does not preclude SARS-Cov-2 infection and should not be used as the sole basis for treatment or other patient management decisions. A negative result may occur with  improper specimen collection/handling, submission of specimen other than nasopharyngeal swab, presence of viral mutation(s) within the areas targeted by this assay, and inadequate number of viral copies(<138 copies/mL). A negative result must be combined with clinical observations, patient history, and epidemiological information. The expected result is Negative.  Fact Sheet for Patients:  EntrepreneurPulse.com.au  Fact Sheet for Healthcare Providers:  IncredibleEmployment.be  This test is no t yet approved or cleared by the [place] FDA and  has been authorized for detection and/or diagnosis of SARS-CoV-2 by FDA under an Emergency Use  Authorization (EUA). This EUA will remain  in effect (meaning this test can be used) for the duration of the COVID-19 declaration under Section 564(b)(1) of the Act, 21 U.S.C.section 360bbb-3(b)(1), unless the authorization is terminated  or revoked sooner.       Influenza A by PCR NEGATIVE NEGATIVE Final   Influenza B by PCR NEGATIVE NEGATIVE Final    Comment: (NOTE) The Xpert Xpress SARS-CoV-2/FLU/RSV plus assay is intended as an aid in the diagnosis of influenza from Nasopharyngeal swab specimens and should not be used as a sole basis for treatment. Nasal washings and aspirates are unacceptable for Xpert Xpress SARS-CoV-2/FLU/RSV testing.  Fact Sheet for Patients: EntrepreneurPulse.com.au  Fact Sheet for Healthcare Providers: IncredibleEmployment.be  This test is not yet approved or cleared by the [place] FDA and has been authorized for detection and/or diagnosis of SARS-CoV-2 by FDA under an Emergency  Use Authorization (EUA). This EUA will remain in effect (meaning this test can be used) for the duration of the COVID-19 declaration under Section 564(b)(1) of the Act, 21 U.S.C. section 360bbb-3(b)(1), unless the authorization is terminated or revoked.  Performed at [facility], [address] [place], [place] [address]   Resp Panel by RT-PCR (Flu A&B, Covid) Nasopharyngeal Swab     Status: None   Collection Time: 09/15/21  9:06 AM   Specimen: Nasopharyngeal Swab; Nasopharyngeal(NP) swabs in vial transport medium  Result Value Ref Range Status   SARS Coronavirus 2 by RT PCR NEGATIVE NEGATIVE Final    Comment: (NOTE) SARS-CoV-2 target nucleic acids are NOT DETECTED.  The SARS-CoV-2 RNA is generally detectable in upper respiratory specimens during the acute phase of infection. The lowest concentration of SARS-CoV-2 viral copies this assay can detect is 138 copies/mL. A negative result does not preclude  SARS-Cov-2 infection and should not be used as the sole basis for treatment or other patient management decisions. A negative result may occur with  improper specimen collection/handling, submission of specimen other than nasopharyngeal swab, presence of viral mutation(s) within the areas targeted by this assay, and inadequate number of viral copies(<138 copies/mL). A negative result must be combined with clinical observations, patient history, and epidemiological information. The expected result is Negative.  Fact Sheet for Patients:  EntrepreneurPulse.com.au  Fact Sheet for Healthcare Providers:  IncredibleEmployment.be  This test is no t yet approved or cleared by the [place] FDA and  has been authorized for detection and/or diagnosis of SARS-CoV-2 by FDA under an Emergency Use Authorization (EUA). This EUA will remain  in effect (meaning this test can be used) for the duration of the COVID-19 declaration under Section 564(b)(1) of the Act, 21 U.S.C.section 360bbb-3(b)(1), unless the authorization is terminated  or revoked sooner.       Influenza A by PCR NEGATIVE NEGATIVE Final   Influenza B by PCR NEGATIVE NEGATIVE Final    Comment: (NOTE) The Xpert Xpress SARS-CoV-2/FLU/RSV plus assay is intended as an aid in the diagnosis of influenza from Nasopharyngeal swab specimens and should not be used as a sole basis for treatment. Nasal washings and aspirates are unacceptable for Xpert Xpress SARS-CoV-2/FLU/RSV testing.  Fact Sheet for Patients: EntrepreneurPulse.com.au  Fact Sheet for Healthcare Providers: IncredibleEmployment.be  This test is not yet approved or cleared by the [place] FDA and has been authorized for detection and/or diagnosis of SARS-CoV-2 by FDA under an Emergency Use Authorization (EUA). This EUA will remain in effect (meaning this test can be used) for the duration of  the COVID-19 declaration under Section 564(b)(1) of the Act, 21 U.S.C. section 360bbb-3(b)(1), unless the authorization is terminated or revoked.  Performed at [facility], [place] [address] [place], [place] [address]   MRSA Next Gen by PCR, Nasal     Status: None   Collection Time: 09/15/21  3:30 PM   Specimen: Nasal Mucosa; Nasal Swab  Result Value Ref Range Status   MRSA by PCR Next Gen NOT DETECTED NOT DETECTED Final    Comment: (NOTE) The GeneXpert MRSA Assay (FDA approved for NASAL specimens only), is one component of a comprehensive MRSA colonization surveillance program. It is not intended to diagnose MRSA infection nor to guide or monitor treatment for MRSA infections. Test performance is not FDA approved in patients less than 38 years old. Performed at [facility], [place] [address] [place],  [address]  Radiology Studies: No results found.      Scheduled Meds:  sodium chloride   Intravenous Once   aspirin  81 mg Oral Daily   bacitracin   Topical Daily   calcium acetate  1,334 mg Oral TID WC   carvedilol  3.125 mg Oral BID WC   Chlorhexidine Gluconate Cloth  6 each Topical Q0600   cinacalcet  30 mg Oral Q breakfast   ferrous sulfate  325 mg Oral BID WC   heparin  5,000 Units Subcutaneous Q8H   isosorbide-hydrALAZINE  1 tablet Oral TID   rosuvastatin  10 mg Oral Daily   sodium chloride flush  3 mL Intravenous Q12H   Continuous Infusions:   LOS: 3 days    Time spent: 35 miutes    Candid Bovey A Aliana Kreischer, MD [facility]   If 7PM-7AM, please contact night-coverage [email or web address]  09/18/2021, 2:38 PM

## 2021-09-18 NOTE — Progress Notes (Signed)
Occupational Therapy Progress Note  Pt seen for fabrication of scrotal sling.  He tolerated it well and initially stated it felt supportive and decreased his pain.  However, pt with c/o intense itching upon OT arrival, which he attributed to bed linens, but he then was insistent that the ace wrap used to support scrotal sling was causing the itching so requested its removal.  Will reattempt as pt tolerates.    09/18/21 1000  OT Visit Information  Last OT Received On 09/18/21  Assistance Needed +1  History of Present Illness Pt is a 55 y.o. M who presents 09/15/2021 with continued scrotal swelling, incidental elevation in troponin with no chest pain. Pt has been admittted multiple times to [facility] hospital and signed out AMA. Significant PMH: ESRD, anemia of CKD, COPD on 2L O2, hypertension.  Precautions  Precautions None  Pain Assessment  Pain Assessment Faces  Faces Pain Scale 4  Pain Location scrotum  Pain Descriptors / Indicators Discomfort;Grimacing;Guarding  Pain Intervention(s) Monitored during session;Limited activity within patient's tolerance  Cognition  Arousal/Alertness Awake/alert  Behavior During Therapy Restless;Agitated;Impulsive (behaviors vacillate between appropriate and agitated yelling at staff.  He is very impulsive)  Overall Cognitive Status No family/caregiver present to determine baseline cognitive functioning  General Comments Pt demonstrates difficulty processing info provided and difficulty following multi step commands.   He is internally distracted by itching and other discomforts.  He demands things be done immediately, and if there is a hesitation escalates to yelling, but also is fairly easily redirectable.  He demonstrates difficulty generalizing info from one situation to another and requires max cues for problem solving novel info  Upper Extremity Assessment  Upper Extremity Assessment Overall WFL for tasks assessed  Lower Extremity Assessment  Lower  Extremity Assessment Overall WFL for tasks assessed  ADL  General ADL Comments Pt is able to complete ADLs mod I.  Instructed him on how to don/doff scrotal sling, but he was unable to follow those instructions without max cuing being provided  Bed Mobility  Overal bed mobility Independent  Transfers  Overall transfer level Independent  Balance  Overall balance assessment No apparent balance deficits (not formally assessed)  Exercises  Exercises Other exercises  Other Exercises  Other Exercises Pt instructed how to elevate scrotum on a folded towel  Other Exercises scrotal sling was fabricated with pt initially indicating it worked well and provided good support.  He however, was complaining of itching that he attributed to the bed linens (RN notified and provided meds).  Pt later attributed his LE itching to the ace wrap around his waist which was suspending the scrotal sling so it was removed.  OT - End of Session  Activity Tolerance Other (comment) (itching)  Patient left in chair;with call bell/phone within reach  Nurse Communication Mobility status  OT Assessment/Plan  OT Plan Discharge plan remains appropriate  OT Visit Diagnosis Pain  Pain - part of body  (scrotum)  OT Frequency (ACUTE ONLY) Min 2X/week  Follow Up Recommendations No OT follow up  Assistance recommended at discharge Set up [place] None recommended by OT  AM-PAC OT "6 Clicks" Daily Activity Outcome Measure (Version 2)  Help from another person eating meals? 4  Help from another person taking care of personal grooming? 4  Help from another person toileting, which includes using toliet, bedpan, or urinal? 4  Help from another person bathing (including washing, rinsing, drying)? 4  Help from another person to put on and taking off  regular upper body clothing? 4  Help from another person to put on and taking off regular lower body clothing? 4  6 Click Score 24  Progressive Mobility   What is the highest level of mobility based on the progressive mobility assessment? Level 6 (Walks independently in room and hall) - Balance while walking in room without assist - Complete  Mobility Ambulated independently in room;Out of bed to chair with meals;Out of bed for toileting  OT Goal Progression  Progress towards OT goals Progressing toward goals  OT Time Calculation  OT Start Time (ACUTE ONLY) 0848  OT Stop Time (ACUTE ONLY) 0914  OT Time Calculation (min) 26 min  OT General Charges  $OT Visit 1 Visit  OT Treatments  $Self Care/Home Management  23-37 mins   Nilsa Nutting., OTR/L Acute Rehabilitation Services Pager [phone] Office [phone]

## 2021-09-18 NOTE — Progress Notes (Signed)
[facility] Progress Note   Subjective:    Patient seen and examined at bedside. Reports continuous itching. Patient with longstanding history of non-compliance with HD. Recent Hgb 6.7-he refused blood transfusion and lab draws ordered from yesterday-plan for him to receive 1 unit PRBCs and obtain labs with HD today.   Objective Vitals:   09/16/21 2208 09/17/21 0439 09/17/21 0939 09/18/21 0853  BP:  118/90 137/83 119/85  Pulse: 88 92 87 77  Resp:  18 20 20   Temp:  98.2 F (36.8 C) 98.4 F (36.9 C) 98.4 F (36.9 C)  TempSrc:   Oral Oral  SpO2: 94% 91% 98% 99%  Weight:      Height:       Physical Exam General: Older male; ill-appearing; NAD Heart: S1 and S2; No murmurs, gallops, or rubs Lungs: Diminished at bases; No wheezing, rales, or rhonchi Abdomen: Soft and non-tender Extremities:No edema BLLE; noted multiple scabs BLLE GU: Scrotal edema noted Dialysis Access: R perm cath. Of note, patient with L UE wound from removal of infected L AVF. Per [facility]: sutures removed. Wrapped with dry dressing- CDI.    Filed Weights   09/15/21 0602 09/16/21 1024  Weight: 72.6 kg 72.8 kg    Intake/Output Summary (Last 24 hours) at 09/18/2021 0914 Last data filed at 09/18/2021 0600 Gross per 24 hour  Intake 1260 ml  Output --  Net 1260 ml    Additional Objective Labs: Basic Metabolic Panel: Recent Labs  Lab 09/11/21 2028 09/15/21 0650 09/15/21 0914 09/17/21 1119  NA 140 140 139 140  K 4.5 5.1 5.0 5.0  CL 100 99 104 101  CO2 24 18*  --  24  GLUCOSE 94 96 99 123*  BUN 80* 78* 89* 68*  CREATININE 10.84* 10.61* 10.60* 10.13*  CALCIUM 9.3 9.1  --  8.0*   Liver Function Tests: Recent Labs  Lab 09/11/21 2028 09/17/21 1119  AST 26 55*  ALT 15 70*  ALKPHOS 120 106  BILITOT 1.0 0.7  PROT 7.8 6.5  ALBUMIN 3.4* 2.7*   No results for input(s): LIPASE, AMYLASE in the last 168 hours. CBC: Recent Labs  Lab 09/11/21 2028 09/15/21 0650 09/15/21 0914  09/17/21 1119  WBC 7.2 12.3*  --  10.5  NEUTROABS 5.4 9.5*  --  8.5*  HGB 7.4* 8.6* 8.8* 6.7*  HCT 23.8* 28.5* 26.0* 20.5*  MCV 103.0* 104.8*  --  102.5*  PLT 134* 187  --  129*   Blood Culture    Component Value Date/Time   SDES  08/21/2021 1615    PERITONEAL Performed at [facility], [address] Madelaine Bhat [place], [place] [address]    [facility]  08/21/2021 1615    NONE Performed at [facility], [address], [place], [place] [address]    CULT  08/21/2021 1615    NO GROWTH 3 DAYS Performed at [facility], [place] [address] [place], [place] [address]    REPTSTATUS 08/25/2021 FINAL 08/21/2021 1615    Cardiac Enzymes: No results for input(s): CKTOTAL, CKMB, CKMBINDEX, TROPONINI in the last 168 hours. CBG: No results for input(s): GLUCAP in the last 168 hours. Iron Studies:  Recent Labs    09/17/21 1119  IRON 37*  TIBC 297  FERRITIN 552*   Lab Results  Component Value Date   INR 1.3 (H) 08/10/2021   INR 1.3 (H) 07/30/2021   INR 1.4 (H) 07/17/2021   Studies/Results: No results found.  Medications:  sodium chloride  sodium chloride      sodium chloride   Intravenous Once   aspirin  81 mg Oral Daily   bacitracin   Topical Daily   calcium acetate  1,334 mg Oral TID WC   carvedilol  3.125 mg Oral BID WC   Chlorhexidine Gluconate Cloth  6 each Topical Q0600   cinacalcet  30 mg Oral Q breakfast   darbepoetin (ARANESP) injection - DIALYSIS  100 mcg Intravenous Q Wed-HD   ferrous sulfate  325 mg Oral BID WC   heparin  5,000 Units Subcutaneous Q8H   isosorbide-hydrALAZINE  1 tablet Oral TID   rosuvastatin  10 mg Oral Daily   sodium chloride flush  3 mL Intravenous Q12H    Dialysis Orders: [facility] MWF 4h  450/800  2/2 bath  67kg  TDC  Hep ?  - calcitriol 1.5ug tiw po  Assessment/Plan: Anasarca/ volume overload - chronic vol overload issue die to noncompliance w/ his diet and HD sessions. HD today; max UF as tolerated.   COPD - on home O2 ESRD - on HD MWF. HD today.  Ongoing pruritus-more likely uremia d/t non-compliance with HD; suspect hyperphosphatemia as well-will obtain labs with HD today to check PO4. Most important thing for him is to go to all required HD treatments outpatient. L UE wound-pt reports clear drainage from wound. Pt followed by [place]. 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 Delana Meyer. Pt supposed to be scheduled for debridement with wound vac but patient left AMA (according to note). Patient with chronic history of non-compliance with HD. Spoke with Gwenette Greet PA from [facility]. Per [facility], order to remove sutures and monitor wound closely. Wound does not appear infected and ABX not indicated at this time. [facility] to follow during hospitalization. Will need outpatient follow-up with [facility] upon discharge. [facility] will be following while patient is here. Anemia ckd - Recent Hgb 6.7; patient refused blood transfusion yesterday-plan to receive 1 unit PRBCs with HD today. On ESA 100ug on mondays here MBD ckd - cont phoslo, sensipar, vdra Chronic combined CHF   Tobie Poet, NP [facility] 09/18/2021,9:14 AM  LOS: 3 days

## 2021-09-18 NOTE — Progress Notes (Signed)
PT Cancellation Note  Patient Details Name: KYREL LEIGHTON MRN: [id number] DOB: March 31, 1966   Cancelled Treatment:    Reason Eval/Treat Not Completed: Patient declined, no reason specified. Pt declines PT, reports he may be willing to try later in the afternoon. PT will attempt to follow up as time allows.   Zenaida Niece 09/18/2021, 3:57 PM

## 2021-09-19 MED ORDER — ASPIRIN 81 MG PO CHEW: 81.0000 mg | CHEWABLE_TABLET | Freq: Every day | ORAL | 0 refills | Status: AC

## 2021-09-19 MED ORDER — CINACALCET HCL 30 MG PO TABS: 30.0000 mg | ORAL_TABLET | Freq: Every day | ORAL | 2 refills | Status: AC

## 2021-09-19 MED ORDER — ALBUTEROL SULFATE HFA 108 (90 BASE) MCG/ACT IN AERS
1.0000 | INHALATION_SPRAY | Freq: Four times a day (QID) | RESPIRATORY_TRACT | Status: DC
  Filled 2021-09-19: qty 6.7

## 2021-09-19 MED ORDER — ALBUTEROL SULFATE HFA 108 (90 BASE) MCG/ACT IN AERS: 1.0000 | INHALATION_SPRAY | Freq: Four times a day (QID) | RESPIRATORY_TRACT | 0 refills | Status: AC

## 2021-09-19 MED ORDER — ISOSORB DINITRATE-HYDRALAZINE 20-37.5 MG PO TABS: 1.0000 | ORAL_TABLET | Freq: Three times a day (TID) | ORAL | 3 refills | Status: AC

## 2021-09-19 MED ORDER — CARVEDILOL 3.125 MG PO TABS: 3.1250 mg | ORAL_TABLET | Freq: Two times a day (BID) | ORAL | 2 refills | Status: AC

## 2021-09-19 MED ORDER — ROSUVASTATIN CALCIUM 10 MG PO TABS: 10.0000 mg | ORAL_TABLET | Freq: Every day | ORAL | 3 refills | Status: AC

## 2021-09-19 NOTE — Progress Notes (Signed)
Pt. refused labs, agreed to reschedule lab work for [place].

## 2021-09-19 NOTE — Progress Notes (Signed)
Pt. refusing telemetry. MD aware. Will continue to monitor.

## 2021-09-19 NOTE — Progress Notes (Signed)
PT Cancellation Note  Patient Details Name: Jordan [name] MRN: [id number] DOB: [birth year]   Cancelled Treatment:    Reason Eval/Treat Not Completed: PT screened, no needs identified, will sign off Pt up, dressed, and moving in room independently. Reports he is leaving and denies PT needs.  Will sign off. Abran Richard, PT Acute Rehab Services Pager [phone] [facility] [place] 09/19/2021, 10:16 AM

## 2021-09-19 NOTE — Progress Notes (Signed)
DISCHARGE NOTE HOME OSTEN JANEK to be discharged Home per MD order. Discussed prescriptions and follow up appointments with the patient. Prescriptions given to patient; medication list explained in detail. Patient verbalized understanding.  Skin clean, dry and intact without evidence of skin break down, no evidence of skin tears noted. IV catheter discontinued intact. Site without signs and symptoms of complications. Dressing and pressure applied. Pt denies pain at the site currently. No complaints noted.  Patient free of lines, drains, and wounds.   An After Visit Summary (AVS) was printed and given to the patient. Patient escorted via wheelchair, and discharged home via private auto.  Dolores Hoose, RN

## 2021-09-19 NOTE — Discharge Summary (Addendum)
Physician Discharge Summary  Jordan [name] TKZ:[id number] DOB: 03-24-1966 DOA: 09/15/2021  PCP: Hoy Register, MD  Admit date: 09/15/2021 Discharge date: 09/19/2021  Admitted From: Home  Disposition: Home   Recommendations for Outpatient Follow-up:  Follow up with PCP in 1-2 weeks Please obtain BMP/CBC in one week Encourage compliance with HD    Discharge Condition: Stable.  CODE STATUS: Full code Diet recommendation: Heart Healthy  Brief/Interim Summary: 55 year old with past medical history significant for ESRD on hemodialysis Monday Wednesday and Friday, anemia of chronic kidney disease, COPD on chronic 2 L of oxygen, hypertension, who presents with massive fluid overload, anasarca with scrotal swelling, mild troponin elevation.  Nephrology consulted and cardiology were consulted and patient was admitted for management of volume overload.  1-ESRD on hemodialysis, history of noncompliance with treatments, multiple AMA discharge from [facility] hospital. Admitted with volume overload, underwent hemodialysis 12/28.    2-anasarca with massive scrotal edema: Continue with volume management with hemodialysis.  Continue with scrotal elevation and fluid removal. Improved.    3-anemia of chronic disease: Patient received 1 unit of packed red blood cell today with hemodialysis plan to repeat hemoglobin tomorrow Per nephrology they will check HB tomorrow at HD.   COPD chronic 2 L of oxygen continue with supportive care   GERD: Continue with PPI Incidental elevation of troponin, no chest pain.  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%: Volume managed with hemodialysis HTN: Continue with Coreg and BiDil    Discharge Diagnoses:  Active Problems:   ESRD (end stage renal disease) [facility])    Discharge Instructions  Discharge Instructions     Diet - low sodium heart  healthy   Complete by: As directed    Discharge wound care:   Complete by: As directed    See above   Increase activity slowly   Complete by: As directed       Allergies as of 09/19/2021       Reactions   Lisinopril Swelling        Medication List     TAKE these medications    albuterol 108 (90 Base) MCG/ACT inhaler Commonly known as: VENTOLIN HFA Inhale 2 puffs into the lungs every 4 (four) hours as needed for wheezing or shortness of breath. What changed: Another medication with the same name was added. Make sure you understand how and when to take each.   albuterol 108 (90 Base) MCG/ACT inhaler Commonly known as: VENTOLIN HFA Inhale 1 puff into the lungs every 6 (six) hours. What changed: You were already taking a medication with the same name, and this prescription was added. Make sure you understand how and when to take each.   aspirin 81 MG chewable tablet Chew 1 tablet (81 mg total) by mouth daily. Start taking on: September 20, 2021   calcium acetate 667 MG capsule Commonly known as: PHOSLO Take 2 capsules (1,334 mg total) by mouth 3 (three) times daily with meals.   carvedilol 3.125 MG tablet Commonly known as: COREG Take 1 tablet (3.125 mg total) by mouth 2 (two) times daily with a meal.   cinacalcet 30 MG tablet Commonly known as: SENSIPAR Take 1 tablet (30 mg total) by mouth daily with breakfast.   epoetin alfa 4000 UNIT/ML injection Commonly known as: EPOGEN Inject 1 mL (4,000 Units total) into the vein every Monday, Wednesday, and Friday with hemodialysis.   ferrous sulfate 325 (  65 FE) MG tablet Take 1 tablet (325 mg total) by mouth 2 (two) times daily with a meal.   isosorbide-hydrALAZINE 20-37.5 MG tablet Commonly known as: BIDIL Take 1 tablet by mouth 3 (three) times daily.   rosuvastatin 10 MG tablet Commonly known as: CRESTOR Take 1 tablet (10 mg total) by mouth daily. Start taking on: September 20, 2021               Discharge  Care Instructions  (From admission, onward)           Start     Ordered   09/19/21 0000  Discharge wound care:       Comments: See above   09/19/21 0942            Allergies  Allergen Reactions   Lisinopril Swelling    Consultations: Nephrology Cardiology    Procedures/Studies: CT ABDOMEN PELVIS WO CONTRAST  Result Date: 08/20/2021 CLINICAL DATA:  Abdominal pain and distention. End-stage renal disease on dialysis. EXAM: CT ABDOMEN AND PELVIS WITHOUT CONTRAST TECHNIQUE: Multidetector CT imaging of the abdomen and pelvis was performed following the standard protocol without IV contrast. COMPARISON:  05/10/2021 and 05/03/2021 FINDINGS: Lower chest: Multiple ill-defined nodular opacities are seen in the superior left lower lobe and lingula, which are new since previous studies several months ago, and most likely inflammatory or infectious in etiology. A moderate pericardial effusion shows no significant change. Hepatobiliary: No mass visualized on this unenhanced exam. Gallbladder is unremarkable no evidence of biliary ductal dilatation. Pancreas: No mass or inflammatory process visualized on this unenhanced exam. Spleen:  Within normal limits in size. Adrenals/Urinary tract: Severe diffuse bilateral renal parenchymal atrophy is consistent with chronic end-stage renal disease. No evidence of hydronephrosis. Urinary bladder is nearly completely empty. Stomach/Bowel: Unremarkable. Vascular/Lymphatic: No pathologically enlarged lymph nodes identified. No evidence of abdominal aortic aneurysm. Aortic atherosclerotic calcification noted. Mild dilatation of the right common iliac artery is again seen measuring 1.9 cm. 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 Reproductive:  Stable mild to moderately enlarged prostate. Other: Large amount of ascites and diffuse mesenteric and body wall edema show no significant change.  Musculoskeletal: No suspicious bone lesions identified. Diffuse osteosclerosis, consistent with renal osteodystrophy. IMPRESSION: No significant change in large amount of ascites, and diffuse mesenteric and body wall edema. Stable moderate pericardial effusion. New ill-defined nodular opacities in superior left lower lobe and lingula, most likely inflammatory or infectious in etiology. Continued follow-up by chest CT is recommended in 2-3 months. 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 Electronically Signed   By: Marlaine Hind M.D.   On: 08/20/2021 20:20   DG Chest 1 View  Result Date: 09/12/2021 CLINICAL DATA:  Initial evaluation for pulmonary edema. EXAM: CHEST  1 VIEW COMPARISON:  Prior radiograph from 09/09/2021. FINDINGS: Dual-lumen right IJ approach central venous catheter remains in place with tips overlying the cavoatrial junction. Moderate to advanced cardiomegaly with globular contour of the cardiac silhouette, similar to previous, and likely reflecting persistent pericardial effusion. Mediastinal silhouette normal. Aortic atherosclerosis. Lungs mildly hypoinflated. Diffuse vascular and interstitial prominence consistent with mild diffuse pulmonary interstitial edema. Overall appearance is improved as compared to 09/09/2021. No visible pleural effusion. No focal infiltrates. No pneumothorax. Osseous structures are unchanged. IMPRESSION: 1. Mild diffuse pulmonary interstitial edema, mildly improved as compared to 09/09/2021. 2. Cardiomegaly with persistent globular contour of the cardiac silhouette, likely reflecting persistent pericardial effusion as previously seen. 3.  Aortic Atherosclerosis (ICD10-I70.0). Electronically Signed   By: Jeannine Boga M.D.   On: 09/12/2021 02:48   DG Chest 2 View  Result Date: 09/02/2021 CLINICAL DATA:  Shortness of breath EXAM: CHEST - 2 VIEW COMPARISON:  08/20/2021 FINDINGS: Right dialysis catheter remains in place, unchanged.  Cardiomegaly. No confluent opacities, effusions or edema. No acute bony abnormality. Aortic atherosclerosis. IMPRESSION: Cardiomegaly.  No active disease. Electronically Signed   By: Rolm Baptise M.D.   On: 09/02/2021 22:06   US Paracentesis  Result Date: 08/22/2021 INDICATION: Renal failure on hemodialysis.  Abdominal distension and ascites. EXAM: ULTRASOUND GUIDED  PARACENTESIS MEDICATIONS: Lidocaine 1% subcutaneous COMPLICATIONS: None immediate. PROCEDURE: Informed written consent was obtained from the patient after a discussion of the risks, benefits and alternatives to treatment. A timeout was performed prior to the initiation of the procedure. Initial ultrasound scanning demonstrates a large amount of ascites within the right lower abdominal quadrant. The right lower abdomen was prepped and draped in the usual sterile fashion. 1% lidocaine was used for local anesthesia. Following this, a 6 Fr Safe-T-Centesis catheter was introduced. An ultrasound image was saved for documentation purposes. The paracentesis was performed. The catheter was removed and a dressing was applied. The patient tolerated the procedure well without immediate post procedural complication. Patient received post-procedure intravenous albumin; see nursing notes for details. FINDINGS: A total of approximately 4 L of clear yellow fluid was removed. Samples were sent to the laboratory as requested by the clinical team. IMPRESSION: Successful ultrasound-guided paracentesis yielding 4 liters of peritoneal fluid. Electronically Signed   By: Lucrezia Europe M.D.   On: 08/22/2021 08:05   DG Chest Portable 1 View  Result Date: 09/15/2021 CLINICAL DATA:  Shortness of breath.  Testicular swelling EXAM: PORTABLE CHEST 1 VIEW COMPARISON:  09/12/2021 FINDINGS: Right dialysis catheter remains in place, unchanged. Enlargement of the cardiopericardial silhouette. Appearance suggest the possibility of pericardial effusion. No confluent airspace opacities,  effusions or edema. No acute bony abnormality. IMPRESSION: Enlargement of the cardiopericardial silhouette, similar to prior study. No acute cardiopulmonary disease. Electronically Signed   By: Rolm Baptise M.D.   On: 09/15/2021 06:56   DG Chest Portable 1 View  Result Date: 09/09/2021 CLINICAL DATA:  Shortness of breath EXAM: PORTABLE CHEST 1 VIEW COMPARISON:  Chest x-ray 09/02/2021 FINDINGS: Cardiac silhouette is enlarged similar to previous study. Calcified plaques in the aortic arch. Right-sided central line stable. Pulmonary vascular congestion with no focal consolidation. No pleural effusion or pneumothorax identified. IMPRESSION: Grossly stable cardiomegaly and pericardial effusion as seen on prior CT. Pulmonary vascular congestion. Electronically Signed   By: Ofilia Neas M.D.   On: 09/09/2021 14:24   DG Chest Portable 1 View  Result Date: 08/20/2021 CLINICAL DATA:  Chest pain EXAM: PORTABLE CHEST 1 VIEW COMPARISON:  08/10/2021 FINDINGS: Moderate cardiomegaly. Unchanged position of right chest wall dual lumen catheter. Mild interstitial pulmonary edema is unchanged. No focal consolidation. No pleural effusion or pneumothorax. IMPRESSION: Cardiomegaly and mild interstitial pulmonary edema, unchanged. Electronically Signed   By: Ulyses Jarred M.D.   On: 08/20/2021 19:45   ECHOCARDIOGRAM COMPLETE  Result Date: 09/15/2021    ECHOCARDIOGRAM REPORT   Patient Name:   YEHUDAH STANDING Date of Exam: 09/15/2021 Medical Rec #:  [id number]        Height:       64.0 in Accession #:    [id number]       Weight:       160.1 lb Date of Birth:  16-May-1966  BSA:          1.780 m Patient Age:    55 years         BP:           122/90 mmHg Patient Gender: M                HR:           86 bpm. Exam Location:  Inpatient Procedure: 2D Echo STAT ECHO Indications:     dyspnea  History:         Patient has prior history of Echocardiogram examinations, most                  recent 07/20/2021. CHF, end stage  renal disease; Risk                  Factors:Hypertension and Current Smoker.  Sonographer:     Johny Chess RDCS Referring Phys:  [id number] G BARRETT Diagnosing Phys: Oswaldo Milian MD IMPRESSIONS  1. Left ventricular ejection fraction, by estimation, is 45 to 50%. The left ventricle has mildly decreased function. The left ventricle demonstrates global hypokinesis. There is mild left ventricular hypertrophy. Left ventricular diastolic parameters are indeterminate.  2. Right ventricular systolic function is moderately reduced. The right ventricular size is moderately enlarged. There is severely elevated pulmonary artery systolic pressure. The estimated right ventricular systolic pressure is 00.8 mmHg.  3. Left atrial size was moderately dilated.  4. Right atrial size was mildly dilated.  5. Moderate pericardial effusion, measures 1.1 cm adjacent to LV lateral wall. No RA/RV collapse seen to suggest tamponade  6. The mitral valve is degenerative. Moderate mitral valve regurgitation. Eccentric posterior directed jet, may be underestimating MR due to eccentric jet  7. The tricuspid valve is abnormal. Tricuspid valve regurgitation is severe.  8. The aortic valve was not well visualized. Aortic valve regurgitation is not visualized. Aortic valve sclerosis/calcification is present, without any evidence of aortic stenosis.  9. The inferior vena cava is dilated in size with <50% respiratory variability, suggesting right atrial pressure of 15 mmHg. FINDINGS  Left Ventricle: Left ventricular ejection fraction, by estimation, is 45 to 50%. The left ventricle has mildly decreased function. The left ventricle demonstrates global hypokinesis. The left ventricular internal cavity size was normal in size. There is  mild left ventricular hypertrophy. Left ventricular diastolic parameters are indeterminate. Right Ventricle: The right ventricular size is moderately enlarged. Right vetricular wall thickness was not well  visualized. Right ventricular systolic function is moderately reduced. There is severely elevated pulmonary artery systolic pressure. The tricuspid regurgitant velocity is 4.33 m/s, and with an assumed right atrial pressure of 15 mmHg, the estimated right ventricular systolic pressure is 67.6 mmHg. Left Atrium: Left atrial size was moderately dilated. Right Atrium: Right atrial size was mildly dilated. Pericardium: A moderately sized pericardial effusion is present. There is no evidence of cardiac tamponade. Mitral Valve: The mitral valve is degenerative in appearance. Moderate mitral valve regurgitation. Tricuspid Valve: The tricuspid valve is abnormal. Tricuspid valve regurgitation is severe. Aortic Valve: The aortic valve was not well visualized. Aortic valve regurgitation is not visualized. Aortic valve sclerosis/calcification is present, without any evidence of aortic stenosis. Pulmonic Valve: The pulmonic valve was grossly normal. Pulmonic valve regurgitation is not visualized. Aorta: The aortic root and ascending aorta are structurally normal, with no evidence of dilitation. Venous: The inferior vena cava is dilated in size with less than 50% respiratory variability, suggesting right atrial pressure  of 15 mmHg. IAS/Shunts: The interatrial septum was not well visualized.  LEFT VENTRICLE PLAX 2D LVIDd:         5.00 cm LVIDs:         3.90 cm LV PW:         1.20 cm LV IVS:        1.20 cm LVOT diam:     1.90 cm LVOT Area:     2.84 cm  RIGHT VENTRICLE            IVC RV S prime:     6.31 cm/s  IVC diam: 2.80 cm TAPSE (M-mode): 0.7 cm LEFT ATRIUM             Index        RIGHT ATRIUM           Index LA diam:        4.80 cm 2.70 cm/m   RA Area:     21.20 cm LA Vol (A2C):   83.2 ml 46.75 ml/m  RA Volume:   68.00 ml  38.21 ml/m LA Vol (A4C):   75.2 ml 42.26 ml/m LA Biplane Vol: 85.9 ml 48.27 ml/m   AORTA Ao Root diam: 3.00 cm Ao Asc diam:  3.10 cm TRICUSPID VALVE TR Peak grad:   75.0 mmHg TR Vmax:        433.00  cm/s  SHUNTS Systemic Diam: 1.90 cm Oswaldo Milian MD Electronically signed by Oswaldo Milian MD Signature Date/Time: 09/15/2021/11:19:20 AM    Final (Updated)    US SCROTUM W/DOPPLER  Result Date: 09/11/2021 CLINICAL DATA:  Scrotal swelling for 1 day EXAM: SCROTAL ULTRASOUND DOPPLER ULTRASOUND OF THE TESTICLES TECHNIQUE: Complete ultrasound examination of the testicles, epididymis, and other scrotal structures was performed. Color and spectral Doppler ultrasound were also utilized to evaluate blood flow to the testicles. COMPARISON:  None. FINDINGS: Right testicle Measurements: 3.4 x 2.8 x 3.2 cm. No mass or microlithiasis visualized. Left testicle Measurements: 3.5 x 2.8 x 2.7 cm. No mass or microlithiasis visualized. Right epididymis:  Normal in size and appearance. Left epididymis:  Normal in size and appearance. Hydrocele:  Small bilateral hydroceles are noted. Varicocele:  None visualized. Pulsed Doppler interrogation of both testes demonstrates normal low resistance arterial and venous waveforms bilaterally. Note is made of diffuse scrotal edema. IMPRESSION: Normal-appearing testicles. Diffuse scrotal edema Is noted. Electronically Signed   By: Inez Catalina M.D.   On: 09/11/2021 22:53     Subjective: He is alert, breathing better,.   Discharge Exam: Vitals:   09/18/21 1251 09/19/21 0929  BP: 123/87 (!) 131/91  Pulse: 80 87  Resp:  17  Temp:  97.9 F (36.6 C)  SpO2:  100%     General: Pt is alert, awake, not in acute distress Cardiovascular: RRR, S1/S2 +, no rubs, no gallops Respiratory: CTA bilaterally, no wheezing, no rhonchi Abdominal: Soft, NT, ND, bowel sounds + Extremities: no edema, no cyanosis    The results of significant diagnostics from this hospitalization (including imaging, microbiology, ancillary and laboratory) are listed below for reference.     Microbiology: Recent Results (from the past 240 hour(s))  Resp Panel by RT-PCR (Flu A&B, Covid)  Nasopharyngeal Swab     Status: None   Collection Time: 09/10/21  7:40 AM   Specimen: Nasopharyngeal Swab; Nasopharyngeal(NP) swabs in vial transport medium  Result Value Ref Range Status   SARS Coronavirus 2 by RT PCR NEGATIVE NEGATIVE Final    Comment: (NOTE) SARS-CoV-2 target nucleic  acids are NOT DETECTED.  The SARS-CoV-2 RNA is generally detectable in upper respiratory specimens during the acute phase of infection. The lowest concentration of SARS-CoV-2 viral copies this assay can detect is 138 copies/mL. A negative result does not preclude SARS-Cov-2 infection and should not be used as the sole basis for treatment or other patient management decisions. A negative result may occur with  improper specimen collection/handling, submission of specimen other than nasopharyngeal swab, presence of viral mutation(s) within the areas targeted by this assay, and inadequate number of viral copies(<138 copies/mL). A negative result must be combined with clinical observations, patient history, and epidemiological information. The expected result is Negative.  Fact Sheet for Patients:  EntrepreneurPulse.com.au  Fact Sheet for Healthcare Providers:  IncredibleEmployment.be  This test is no t yet approved or cleared by the [place] FDA and  has been authorized for detection and/or diagnosis of SARS-CoV-2 by FDA under an Emergency Use Authorization (EUA). This EUA will remain  in effect (meaning this test can be used) for the duration of the COVID-19 declaration under Section 564(b)(1) of the Act, 21 U.S.C.section 360bbb-3(b)(1), unless the authorization is terminated  or revoked sooner.       Influenza A by PCR NEGATIVE NEGATIVE Final   Influenza B by PCR NEGATIVE NEGATIVE Final    Comment: (NOTE) The Xpert Xpress SARS-CoV-2/FLU/RSV plus assay is intended as an aid in the diagnosis of influenza from Nasopharyngeal swab specimens and should not be  used as a sole basis for treatment. Nasal washings and aspirates are unacceptable for Xpert Xpress SARS-CoV-2/FLU/RSV testing.  Fact Sheet for Patients: EntrepreneurPulse.com.au  Fact Sheet for Healthcare Providers: IncredibleEmployment.be  This test is not yet approved or cleared by the [place] FDA and has been authorized for detection and/or diagnosis of SARS-CoV-2 by FDA under an Emergency Use Authorization (EUA). This EUA will remain in effect (meaning this test can be used) for the duration of the COVID-19 declaration under Section 564(b)(1) of the Act, 21 U.S.C. section 360bbb-3(b)(1), unless the authorization is terminated or revoked.  Performed at [facility], [address] [place], [place] [address]   Resp Panel by RT-PCR (Flu A&B, Covid) Nasopharyngeal Swab     Status: None   Collection Time: 09/12/21  7:10 AM   Specimen: Nasopharyngeal Swab; Nasopharyngeal(NP) swabs in vial transport medium  Result Value Ref Range Status   SARS Coronavirus 2 by RT PCR NEGATIVE NEGATIVE Final    Comment: (NOTE) SARS-CoV-2 target nucleic acids are NOT DETECTED.  The SARS-CoV-2 RNA is generally detectable in upper respiratory specimens during the acute phase of infection. The lowest concentration of SARS-CoV-2 viral copies this assay can detect is 138 copies/mL. A negative result does not preclude SARS-Cov-2 infection and should not be used as the sole basis for treatment or other patient management decisions. A negative result may occur with  improper specimen collection/handling, submission of specimen other than nasopharyngeal swab, presence of viral mutation(s) within the areas targeted by this assay, and inadequate number of viral copies(<138 copies/mL). A negative result must be combined with clinical observations, patient history, and epidemiological information. The expected result is Negative.  Fact Sheet for Patients:   EntrepreneurPulse.com.au  Fact Sheet for Healthcare Providers:  IncredibleEmployment.be  This test is no t yet approved or cleared by the [place] FDA and  has been authorized for detection and/or diagnosis of SARS-CoV-2 by FDA under an Emergency Use Authorization (EUA). This EUA will remain  in effect (meaning this test can be  used) for the duration of the COVID-19 declaration under Section 564(b)(1) of the Act, 21 U.S.C.section 360bbb-3(b)(1), unless the authorization is terminated  or revoked sooner.       Influenza A by PCR NEGATIVE NEGATIVE Final   Influenza B by PCR NEGATIVE NEGATIVE Final    Comment: (NOTE) The Xpert Xpress SARS-CoV-2/FLU/RSV plus assay is intended as an aid in the diagnosis of influenza from Nasopharyngeal swab specimens and should not be used as a sole basis for treatment. Nasal washings and aspirates are unacceptable for Xpert Xpress SARS-CoV-2/FLU/RSV testing.  Fact Sheet for Patients: EntrepreneurPulse.com.au  Fact Sheet for Healthcare Providers: IncredibleEmployment.be  This test is not yet approved or cleared by the [place] FDA and has been authorized for detection and/or diagnosis of SARS-CoV-2 by FDA under an Emergency Use Authorization (EUA). This EUA will remain in effect (meaning this test can be used) for the duration of the COVID-19 declaration under Section 564(b)(1) of the Act, 21 U.S.C. section 360bbb-3(b)(1), unless the authorization is terminated or revoked.  Performed at [facility], [address] [place], [place] [address]   Resp Panel by RT-PCR (Flu A&B, Covid) Nasopharyngeal Swab     Status: None   Collection Time: 09/15/21  9:06 AM   Specimen: Nasopharyngeal Swab; Nasopharyngeal(NP) swabs in vial transport medium  Result Value Ref Range Status   SARS Coronavirus 2 by RT PCR NEGATIVE NEGATIVE Final    Comment: (NOTE) SARS-CoV-2  target nucleic acids are NOT DETECTED.  The SARS-CoV-2 RNA is generally detectable in upper respiratory specimens during the acute phase of infection. The lowest concentration of SARS-CoV-2 viral copies this assay can detect is 138 copies/mL. A negative result does not preclude SARS-Cov-2 infection and should not be used as the sole basis for treatment or other patient management decisions. A negative result may occur with  improper specimen collection/handling, submission of specimen other than nasopharyngeal swab, presence of viral mutation(s) within the areas targeted by this assay, and inadequate number of viral copies(<138 copies/mL). A negative result must be combined with clinical observations, patient history, and epidemiological information. The expected result is Negative.  Fact Sheet for Patients:  EntrepreneurPulse.com.au  Fact Sheet for Healthcare Providers:  IncredibleEmployment.be  This test is no t yet approved or cleared by the [place] FDA and  has been authorized for detection and/or diagnosis of SARS-CoV-2 by FDA under an Emergency Use Authorization (EUA). This EUA will remain  in effect (meaning this test can be used) for the duration of the COVID-19 declaration under Section 564(b)(1) of the Act, 21 U.S.C.section 360bbb-3(b)(1), unless the authorization is terminated  or revoked sooner.       Influenza A by PCR NEGATIVE NEGATIVE Final   Influenza B by PCR NEGATIVE NEGATIVE Final    Comment: (NOTE) The Xpert Xpress SARS-CoV-2/FLU/RSV plus assay is intended as an aid in the diagnosis of influenza from Nasopharyngeal swab specimens and should not be used as a sole basis for treatment. Nasal washings and aspirates are unacceptable for Xpert Xpress SARS-CoV-2/FLU/RSV testing.  Fact Sheet for Patients: EntrepreneurPulse.com.au  Fact Sheet for Healthcare  Providers: IncredibleEmployment.be  This test is not yet approved or cleared by the [place] FDA and has been authorized for detection and/or diagnosis of SARS-CoV-2 by FDA under an Emergency Use Authorization (EUA). This EUA will remain in effect (meaning this test can be used) for the duration of the COVID-19 declaration under Section 564(b)(1) of the Act, 21 U.S.C. section 360bbb-3(b)(1), unless the authorization is terminated  or revoked.  Performed at [facility], [place] [address], [place], [place] [address]   MRSA Next Gen by PCR, Nasal     Status: None   Collection Time: 09/15/21  3:30 PM   Specimen: Nasal Mucosa; Nasal Swab  Result Value Ref Range Status   MRSA by PCR Next Gen NOT DETECTED NOT DETECTED Final    Comment: (NOTE) The GeneXpert MRSA Assay (FDA approved for NASAL specimens only), is one component of a comprehensive MRSA colonization surveillance program. It is not intended to diagnose MRSA infection nor to guide or monitor treatment for MRSA infections. Test performance is not FDA approved in patients less than 60 years old. Performed at [facility], [place] [address] [place], [place] [address]      Labs: BNP (last 3 results) Recent Labs    09/15/21 0650 09/17/21 1119 09/18/21 0836  BNP 3,856.3* 4,376.3* 5,852.7*   Basic Metabolic Panel: Recent Labs  Lab 09/15/21 0650 09/15/21 0914 09/17/21 1119 09/18/21 0836  NA 140 139 140 140  K 5.1 5.0 5.0 5.1  CL 99 104 101 103  CO2 18*  --  24 23  GLUCOSE 96 99 123* 91  BUN 78* 89* 68* 80*  CREATININE 10.61* 10.60* 10.13* 11.13*  CALCIUM 9.1  --  8.0* 7.4*  MG  --   --  2.1 2.2  PHOS  --   --   --  6.4*   Liver Function Tests: Recent Labs  Lab 09/17/21 1119 09/18/21 0836  AST 55* 40  ALT 70* 57*  ALKPHOS 106 98  BILITOT 0.7 0.9  PROT 6.5 6.4*  ALBUMIN 2.7* 2.7*   No results for input(s): LIPASE, AMYLASE in the last 168 hours. No results for input(s):  AMMONIA in the last 168 hours. CBC: Recent Labs  Lab 09/15/21 0650 09/15/21 0914 09/17/21 1119 09/18/21 0836  WBC 12.3*  --  10.5 11.0*  NEUTROABS 9.5*  --  8.5* 8.7*  HGB 8.6* 8.8* 6.7* 6.6*  HCT 28.5* 26.0* 20.5* 20.4*  MCV 104.8*  --  102.5* 103.0*  PLT 187  --  129* 128*   Cardiac Enzymes: No results for input(s): CKTOTAL, CKMB, CKMBINDEX, TROPONINI in the last 168 hours. BNP: Invalid input(s): POCBNP CBG: No results for input(s): GLUCAP in the last 168 hours. D-Dimer No results for input(s): DDIMER in the last 72 hours. Hgb A1c No results for input(s): HGBA1C in the last 72 hours. Lipid Profile No results for input(s): CHOL, HDL, LDLCALC, TRIG, CHOLHDL, LDLDIRECT in the last 72 hours. Thyroid function studies No results for input(s): TSH, T4TOTAL, T3FREE, THYROIDAB in the last 72 hours.  Invalid input(s): FREET3 Anemia work up Recent Labs    09/17/21 1119  VITAMINB12 651  FOLATE 12.5  FERRITIN 552*  TIBC 297  IRON 37*  RETICCTPCT 4.2*   Urinalysis    Component Value Date/Time   COLORURINE YELLOW (A) 01/13/2021 0153   APPEARANCEUR CLEAR (A) 01/13/2021 0153   LABSPEC 1.012 01/13/2021 0153   PHURINE 9.0 (H) 01/13/2021 0153   GLUCOSEU 150 (A) 01/13/2021 0153   HGBUR NEGATIVE 01/13/2021 0153   BILIRUBINUR NEGATIVE 01/13/2021 0153   KETONESUR NEGATIVE 01/13/2021 0153   PROTEINUR >=300 (A) 01/13/2021 0153   NITRITE NEGATIVE 01/13/2021 0153   LEUKOCYTESUR NEGATIVE 01/13/2021 0153   Sepsis Labs Invalid input(s): PROCALCITONIN,  WBC,  LACTICIDVEN Microbiology Recent Results (from the past 240 hour(s))  Resp Panel by RT-PCR (Flu A&B, Covid) Nasopharyngeal Swab     Status:  None   Collection Time: 09/10/21  7:40 AM   Specimen: Nasopharyngeal Swab; Nasopharyngeal(NP) swabs in vial transport medium  Result Value Ref Range Status   SARS Coronavirus 2 by RT PCR NEGATIVE NEGATIVE Final    Comment: (NOTE) SARS-CoV-2 target nucleic acids are NOT DETECTED.  The  SARS-CoV-2 RNA is generally detectable in upper respiratory specimens during the acute phase of infection. The lowest concentration of SARS-CoV-2 viral copies this assay can detect is 138 copies/mL. A negative result does not preclude SARS-Cov-2 infection and should not be used as the sole basis for treatment or other patient management decisions. A negative result may occur with  improper specimen collection/handling, submission of specimen other than nasopharyngeal swab, presence of viral mutation(s) within the areas targeted by this assay, and inadequate number of viral copies(<138 copies/mL). A negative result must be combined with clinical observations, patient history, and epidemiological information. The expected result is Negative.  Fact Sheet for Patients:  EntrepreneurPulse.com.au  Fact Sheet for Healthcare Providers:  IncredibleEmployment.be  This test is no t yet approved or cleared by the [place] FDA and  has been authorized for detection and/or diagnosis of SARS-CoV-2 by FDA under an Emergency Use Authorization (EUA). This EUA will remain  in effect (meaning this test can be used) for the duration of the COVID-19 declaration under Section 564(b)(1) of the Act, 21 U.S.C.section 360bbb-3(b)(1), unless the authorization is terminated  or revoked sooner.       Influenza A by PCR NEGATIVE NEGATIVE Final   Influenza B by PCR NEGATIVE NEGATIVE Final    Comment: (NOTE) The Xpert Xpress SARS-CoV-2/FLU/RSV plus assay is intended as an aid in the diagnosis of influenza from Nasopharyngeal swab specimens and should not be used as a sole basis for treatment. Nasal washings and aspirates are unacceptable for Xpert Xpress SARS-CoV-2/FLU/RSV testing.  Fact Sheet for Patients: EntrepreneurPulse.com.au  Fact Sheet for Healthcare Providers: IncredibleEmployment.be  This test is not yet approved or  cleared by the [place] FDA and has been authorized for detection and/or diagnosis of SARS-CoV-2 by FDA under an Emergency Use Authorization (EUA). This EUA will remain in effect (meaning this test can be used) for the duration of the COVID-19 declaration under Section 564(b)(1) of the Act, 21 U.S.C. section 360bbb-3(b)(1), unless the authorization is terminated or revoked.  Performed at [facility], [place] [place], [place] [address]   Resp Panel by RT-PCR (Flu A&B, Covid) Nasopharyngeal Swab     Status: None   Collection Time: 09/12/21  7:10 AM   Specimen: Nasopharyngeal Swab; Nasopharyngeal(NP) swabs in vial transport medium  Result Value Ref Range Status   SARS Coronavirus 2 by RT PCR NEGATIVE NEGATIVE Final    Comment: (NOTE) SARS-CoV-2 target nucleic acids are NOT DETECTED.  The SARS-CoV-2 RNA is generally detectable in upper respiratory specimens during the acute phase of infection. The lowest concentration of SARS-CoV-2 viral copies this assay can detect is 138 copies/mL. A negative result does not preclude SARS-Cov-2 infection and should not be used as the sole basis for treatment or other patient management decisions. A negative result may occur with  improper specimen collection/handling, submission of specimen other than nasopharyngeal swab, presence of viral mutation(s) within the areas targeted by this assay, and inadequate number of viral copies(<138 copies/mL). A negative result must be combined with clinical observations, patient history, and epidemiological information. The expected result is Negative.  Fact Sheet for Patients:  EntrepreneurPulse.com.au  Fact Sheet for Healthcare Providers:  IncredibleEmployment.be  This test is no t yet approved or cleared by the [place] and  has been authorized for detection and/or diagnosis of SARS-CoV-2 by FDA under an Emergency Use Authorization (EUA).  This EUA will remain  in effect (meaning this test can be used) for the duration of the COVID-19 declaration under Section 564(b)(1) of the Act, 21 U.S.C.section 360bbb-3(b)(1), unless the authorization is terminated  or revoked sooner.       Influenza A by PCR NEGATIVE NEGATIVE Final   Influenza B by PCR NEGATIVE NEGATIVE Final    Comment: (NOTE) The Xpert Xpress SARS-CoV-2/FLU/RSV plus assay is intended as an aid in the diagnosis of influenza from Nasopharyngeal swab specimens and should not be used as a sole basis for treatment. Nasal washings and aspirates are unacceptable for Xpert Xpress SARS-CoV-2/FLU/RSV testing.  Fact Sheet for Patients: EntrepreneurPulse.com.au  Fact Sheet for Healthcare Providers: IncredibleEmployment.be  This test is not yet approved or cleared by the [place] FDA and has been authorized for detection and/or diagnosis of SARS-CoV-2 by FDA under an Emergency Use Authorization (EUA). This EUA will remain in effect (meaning this test can be used) for the duration of the COVID-19 declaration under Section 564(b)(1) of the Act, 21 U.S.C. section 360bbb-3(b)(1), unless the authorization is terminated or revoked.  Performed at [facility], [place] [place], [place] [address]   Resp Panel by RT-PCR (Flu A&B, Covid) Nasopharyngeal Swab     Status: None   Collection Time: 09/15/21  9:06 AM   Specimen: Nasopharyngeal Swab; Nasopharyngeal(NP) swabs in vial transport medium  Result Value Ref Range Status   SARS Coronavirus 2 by RT PCR NEGATIVE NEGATIVE Final    Comment: (NOTE) SARS-CoV-2 target nucleic acids are NOT DETECTED.  The SARS-CoV-2 RNA is generally detectable in upper respiratory specimens during the acute phase of infection. The lowest concentration of SARS-CoV-2 viral copies this assay can detect is 138 copies/mL. A negative result does not preclude SARS-Cov-2 infection and should not  be used as the sole basis for treatment or other patient management decisions. A negative result may occur with  improper specimen collection/handling, submission of specimen other than nasopharyngeal swab, presence of viral mutation(s) within the areas targeted by this assay, and inadequate number of viral copies(<138 copies/mL). A negative result must be combined with clinical observations, patient history, and epidemiological information. The expected result is Negative.  Fact Sheet for Patients:  EntrepreneurPulse.com.au  Fact Sheet for Healthcare Providers:  IncredibleEmployment.be  This test is no t yet approved or cleared by the [place] FDA and  has been authorized for detection and/or diagnosis of SARS-CoV-2 by FDA under an Emergency Use Authorization (EUA). This EUA will remain  in effect (meaning this test can be used) for the duration of the COVID-19 declaration under Section 564(b)(1) of the Act, 21 U.S.C.section 360bbb-3(b)(1), unless the authorization is terminated  or revoked sooner.       Influenza A by PCR NEGATIVE NEGATIVE Final   Influenza B by PCR NEGATIVE NEGATIVE Final    Comment: (NOTE) The Xpert Xpress SARS-CoV-2/FLU/RSV plus assay is intended as an aid in the diagnosis of influenza from Nasopharyngeal swab specimens and should not be used as a sole basis for treatment. Nasal washings and aspirates are unacceptable for Xpert Xpress SARS-CoV-2/FLU/RSV testing.  Fact Sheet for Patients: EntrepreneurPulse.com.au  Fact Sheet for Healthcare Providers: IncredibleEmployment.be  This test is not yet approved or cleared by the [place] FDA and has been authorized for detection and/or  diagnosis of SARS-CoV-2 by FDA under an Emergency Use Authorization (EUA). This EUA will remain in effect (meaning this test can be used) for the duration of the COVID-19 declaration under Section  564(b)(1) of the Act, 21 U.S.C. section 360bbb-3(b)(1), unless the authorization is terminated or revoked.  Performed at [facility], [place] [address], [place], [place] [address]   MRSA Next Gen by PCR, Nasal     Status: None   Collection Time: 09/15/21  3:30 PM   Specimen: Nasal Mucosa; Nasal Swab  Result Value Ref Range Status   MRSA by PCR Next Gen NOT DETECTED NOT DETECTED Final    Comment: (NOTE) The GeneXpert MRSA Assay (FDA approved for NASAL specimens only), is one component of a comprehensive MRSA colonization surveillance program. It is not intended to diagnose MRSA infection nor to guide or monitor treatment for MRSA infections. Test performance is not FDA approved in patients less than 30 years old. Performed at [facility], [place] [address], [place], [place] [address]      Time coordinating discharge: 40 minutes  SIGNED:   Elmarie Shiley, MD  [facility]

## 2021-09-19 NOTE — Plan of Care (Signed)
°  Problem: Health Behavior/Discharge Planning: Goal: Ability to manage health-related needs will improve Outcome: Adequate for Discharge   Problem: Clinical Measurements: Goal: Ability to maintain clinical measurements within normal limits will improve Outcome: Adequate for Discharge Goal: Respiratory complications will improve Outcome: Adequate for Discharge   Problem: Activity: Goal: Risk for activity intolerance will decrease Outcome: Adequate for Discharge   Problem: Coping: Goal: Level of anxiety will decrease Outcome: Adequate for Discharge   Problem: Elimination: Goal: Will not experience complications related to bowel motility Outcome: Adequate for Discharge   Problem: Pain Managment: Goal: General experience of comfort will improve Outcome: Adequate for Discharge   Problem: Skin Integrity: Goal: Risk for impaired skin integrity will decrease Outcome: Adequate for Discharge   Problem: Education: Goal: Knowledge of disease and its progression will improve 09/19/2021 1051 by Dolores Hoose, RN Outcome: Adequate for Discharge 09/19/2021 0810 by Dolores Hoose, RN Outcome: Progressing Goal: Individualized Educational Video(s) Outcome: Adequate for Discharge   Problem: Fluid Volume: Goal: Compliance with measures to maintain balanced fluid volume will improve 09/19/2021 1051 by Dolores Hoose, RN Outcome: Adequate for Discharge 09/19/2021 0810 by Dolores Hoose, RN Outcome: Progressing   Problem: Health Behavior/Discharge Planning: Goal: Ability to manage health-related needs will improve 09/19/2021 1051 by Dolores Hoose, RN Outcome: Adequate for Discharge 09/19/2021 0810 by Dolores Hoose, RN Outcome: Progressing   Problem: Nutritional: Goal: Ability to make healthy dietary choices will improve 09/19/2021 1051 by Dolores Hoose, RN Outcome: Adequate for Discharge 09/19/2021 0810 by Dolores Hoose, RN Outcome: Progressing    Problem: Clinical Measurements: Goal: Complications related to the disease process, condition or treatment will be avoided or minimized 09/19/2021 1051 by Dolores Hoose, RN Outcome: Adequate for Discharge 09/19/2021 0810 by Dolores Hoose, RN Outcome: Progressing   Problem: Education: Goal: Knowledge of disease and its progression will improve 09/19/2021 1051 by Dolores Hoose, RN Outcome: Adequate for Discharge 09/19/2021 0810 by Dolores Hoose, RN Outcome: Progressing Goal: Individualized Educational Video(s) Outcome: Adequate for Discharge   Problem: Fluid Volume: Goal: Compliance with measures to maintain balanced fluid volume will improve 09/19/2021 1051 by Dolores Hoose, RN Outcome: Adequate for Discharge 09/19/2021 0810 by Dolores Hoose, RN Outcome: Progressing   Problem: Health Behavior/Discharge Planning: Goal: Ability to manage health-related needs will improve Outcome: Adequate for Discharge   Problem: Nutritional: Goal: Ability to make healthy dietary choices will improve Outcome: Adequate for Discharge   Problem: Clinical Measurements: Goal: Complications related to the disease process, condition or treatment will be avoided or minimized Outcome: Adequate for Discharge   Problem: Acute Rehab OT Goals (only OT should resolve) Goal: OT Additional ADL Goal #1 Outcome: Adequate for Discharge   Problem: Education: Goal: Knowledge of disease and its progression will improve Outcome: Adequate for Discharge Goal: Individualized Educational Video(s) Outcome: Adequate for Discharge   Problem: Fluid Volume: Goal: Compliance with measures to maintain balanced fluid volume will improve Outcome: Adequate for Discharge   Problem: Health Behavior/Discharge Planning: Goal: Ability to manage health-related needs will improve Outcome: Adequate for Discharge   Problem: Nutritional: Goal: Ability to make healthy dietary choices will  improve Outcome: Adequate for Discharge   Problem: Clinical Measurements: Goal: Complications related to the disease process, condition or treatment will be avoided or minimized Outcome: Adequate for Discharge

## 2021-09-19 NOTE — Progress Notes (Signed)
D/C order noted. Contacted [facility] to advise staff that pt will d/c today and to resume care tomorrow. Pt has a MWF schedule at out--pt clinic (person I spoke to is unsure of pt's chair time). Will fax d/c summary to clinic for continuation of care once completed.   Melven Sartorius Renal Navigator [phone]

## 2021-09-19 NOTE — Plan of Care (Signed)
°  Problem: Education: Goal: Knowledge of disease and its progression will improve Outcome: Progressing   Problem: Fluid Volume: Goal: Compliance with measures to maintain balanced fluid volume will improve Outcome: Progressing   Problem: Health Behavior/Discharge Planning: Goal: Ability to manage health-related needs will improve Outcome: Progressing   Problem: Nutritional: Goal: Ability to make healthy dietary choices will improve Outcome: Progressing   Problem: Clinical Measurements: Goal: Complications related to the disease process, condition or treatment will be avoided or minimized Outcome: Progressing   Problem: Education: Goal: Knowledge of disease and its progression will improve Outcome: Progressing   Problem: Fluid Volume: Goal: Compliance with measures to maintain balanced fluid volume will improve Outcome: Progressing

## 2021-09-19 NOTE — TOC Transition Note (Signed)
Transition of Care [facility]) - CM/SW Discharge Note   Patient Details  Name: Jordan [name] MRN: [id number] Date of Birth: May 15, 1966  [facility] of [facility]) CM/SW Contact:  Tom-[name], Renea Ee, RN Phone Number: 09/19/2021, 12:12 PM   Clinical Narrative:     Transition of Care [facility]) Screening Note   Patient Details  Name: Jordan [name] Date of Birth: October 14, 1965   Transition of Care [facility]) CM/SW Contact:    Tom-[name], Renea Ee, RN Phone Number: 09/19/2021, 12:14 PM  Patient is scheduled for discharge today. Patient requesting transportation. Voucher for [employer] given with approval from Supervisor. Denies any other needs. No further TOC needs noted.  Final next level of care: Home/Self Care Barriers to Discharge: Barriers Resolved   Patient Goals and CMS Choice Patient states their goals for this hospitalization and ongoing recovery are:: To go home CMS Medicare.gov Compare Post Acute Care list provided to:: Patient Choice offered to / list presented to : NA  Discharge Placement                       Discharge Plan and Services   Discharge Planning Services: CM Consult            DME Arranged: N/A DME Agency: NA       HH Arranged: NA HH Agency: NA        Social Determinants of Health (SDOH) Interventions     Readmission Risk Interventions Readmission Risk Prevention Plan 07/22/2021  Transportation Screening Complete  Medication Review [employer]) Complete  PCP or Specialist appointment within 3-5 days of discharge Complete  SW Recovery Care/Counseling Consult Complete  [place] Not Applicable  Some recent data might be hidden

## 2021-09-19 NOTE — Progress Notes (Signed)
[facility] Progress Note   Subjective:    Patient scheduled for discharge today. Saw Jordan [name] standing at the nurses station waiting for his paperwork. He is eager to go home. Tolerated yesterday's HD with net UF 2.8L. S/p 1 unit PRBCs during yesterday's HD for recent Hgb 6.6. Plan to re-check Hgb at next HD tomorrow in outpatient.   Objective Vitals:   09/18/21 1230 09/18/21 1244 09/18/21 1251 09/19/21 0929  BP: 123/66 123/68 123/87 (!) 131/91  Pulse: 78 79 80 87  Resp:  15  17  Temp:  97.7 F (36.5 C)  97.9 F (36.6 C)  TempSrc:  Oral    SpO2:  98%  100%  Weight:  69.6 kg    Height:       Physical Exam General: Older male; NAD Extremities: No edema BLLE; multiple scabs BLLE Dialysis Access:  R perm cath. Of note, patient with L UE wound from removal of infected L AVF. Wrapped with dry dressing- CDI  Filed Weights   09/16/21 1024 09/18/21 0926 09/18/21 1244  Weight: 72.8 kg 71.1 kg 69.6 kg    Intake/Output Summary (Last 24 hours) at 09/19/2021 1340 Last data filed at 09/19/2021 0800 Gross per 24 hour  Intake 1431 ml  Output --  Net 1431 ml    Additional Objective Labs: Basic Metabolic Panel: Recent Labs  Lab 09/15/21 0650 09/15/21 0914 09/17/21 1119 09/18/21 0836  NA 140 139 140 140  K 5.1 5.0 5.0 5.1  CL 99 104 101 103  CO2 18*  --  24 23  GLUCOSE 96 99 123* 91  BUN 78* 89* 68* 80*  CREATININE 10.61* 10.60* 10.13* 11.13*  CALCIUM 9.1  --  8.0* 7.4*  PHOS  --   --   --  6.4*   Liver Function Tests: Recent Labs  Lab 09/17/21 1119 09/18/21 0836  AST 55* 40  ALT 70* 57*  ALKPHOS 106 98  BILITOT 0.7 0.9  PROT 6.5 6.4*  ALBUMIN 2.7* 2.7*   No results for input(s): LIPASE, AMYLASE in the last 168 hours. CBC: Recent Labs  Lab 09/15/21 0650 09/15/21 0914 09/17/21 1119 09/18/21 0836  WBC 12.3*  --  10.5 11.0*  NEUTROABS 9.5*  --  8.5* 8.7*  HGB 8.6* 8.8* 6.7* 6.6*  HCT 28.5* 26.0* 20.5* 20.4*  MCV 104.8*  --  102.5* 103.0*   PLT 187  --  129* 128*   Blood Culture    Component Value Date/Time   SDES  08/21/2021 1615    PERITONEAL Performed at [facility], [address] [place], [place] [address]    [facility]  08/21/2021 1615    NONE Performed at [facility], [address], [place], [place] [address]    CULT  08/21/2021 1615    NO GROWTH 3 DAYS Performed at [facility], [place] [address] [place], [place] [address]    REPTSTATUS 08/25/2021 FINAL 08/21/2021 1615    Cardiac Enzymes: No results for input(s): CKTOTAL, CKMB, CKMBINDEX, TROPONINI in the last 168 hours. CBG: No results for input(s): GLUCAP in the last 168 hours. Iron Studies:  Recent Labs    09/17/21 1119  IRON 37*  TIBC 297  FERRITIN 552*   Lab Results  Component Value Date   INR 1.3 (H) 08/10/2021   INR 1.3 (H) 07/30/2021   INR 1.4 (H) 07/17/2021   Studies/Results: No results found.  Medications:   sodium chloride   Intravenous Once   albuterol  1  puff Inhalation Q6H   aspirin  81 mg Oral Daily   bacitracin   Topical Daily   calcium acetate  1,334 mg Oral TID WC   carvedilol  3.125 mg Oral BID WC   Chlorhexidine Gluconate Cloth  6 each Topical Q0600   cinacalcet  30 mg Oral Q breakfast   ferrous sulfate  325 mg Oral BID WC   heparin  5,000 Units Subcutaneous Q8H   isosorbide-hydrALAZINE  1 tablet Oral TID   rosuvastatin  10 mg Oral Daily   sodium chloride flush  3 mL Intravenous Q12H    Dialysis Orders: [facility] ([facility]) MWF 4h  450/800  2/2 bath  67kg  TDC  Hep ?  - calcitriol 1.5ug tiw po  Assessment/Plan: Anasarca/ volume overload - chronic vol overload issue die to noncompliance w/ his diet and HD sessions. HD today; max UF as tolerated.  COPD - on home O2 ESRD - on HD MWF. Resume HD tomorrow in outpatient.  Ongoing pruritus-more likely uremia d/t non-compliance with HD; PO4 elevated-continue binder management in outpatient. Most important thing for him is to go to all  required HD treatments outpatient. L UE wound-pt reports clear drainage from wound. Pt followed by [facility]. 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 Delana Meyer. Pt supposed to be scheduled for debridement with wound vac but patient left AMA (according to note). Patient with chronic history of non-compliance with HD. Spoke with Gwenette Greet PA from [facility]. Per [facility], order to remove sutures and monitor wound closely. Wound does not appear infected and ABX not indicated at this time. [facility] to follow during hospitalization. Will need outpatient follow-up with [facility] upon discharge. [facility] will be following while patient is here. Anemia ckd - Recent Hgb 6.6; s/p 1 unit PRBC with HD 12/28. Will re-check CBC tomorrow in outpatient. MBD ckd - cont phoslo, sensipar, vdra Chronic combined CHF  Disposition: Okay for discharge from renal standpoint.  Tobie Poet, NP [facility] 09/19/2021,1:40 PM  LOS: 4 days

## 2021-09-20 NOTE — Progress Notes (Signed)
Late Entry Note: D/C summary faxed to [facility] for continuation of care.  Melven Sartorius Renal Navigator [phone]

## 2021-09-21 ENCOUNTER — Telehealth: Payer: Self-pay | Admitting: Physician Assistant

## 2021-09-21 LAB — BPAM RBC
Blood Product Expiration Date: 202301032359
Blood Product Expiration Date: 202301042359
ISSUE DATE / TIME: 202212281032
Unit Type and Rh: 9500
Unit Type and Rh: 9500

## 2021-09-21 LAB — TYPE AND SCREEN
ABO/RH(D): O NEG
Antibody Screen: POSITIVE
DAT, IgG: POSITIVE
Donor AG Type: NEGATIVE
Donor AG Type: NEGATIVE
Unit division: 0
Unit division: 0

## 2021-09-21 NOTE — Telephone Encounter (Signed)
Transition of care contact from inpatient facility  Date of Discharge: 09/19/21 Date of Contact: 09/21/21 Method of contact: Phone  Attempted to contact patient to discuss transition of care from inpatient admission. Patient did not answer the phone. Mailbox full, unable to LM. Will plan to follow up with him at dialysis.   Anice Paganini, PA-C 09/21/2021, 11:07 AM  [facility] Pager: [phone]

## 2021-09-26 ENCOUNTER — Encounter: Payer: Self-pay | Admitting: *Deleted

## 2021-09-26 ENCOUNTER — Other Ambulatory Visit: Payer: Self-pay

## 2021-09-26 ENCOUNTER — Emergency Department
Admission: EM | Admit: 2021-09-26 | Discharge: 2021-09-26 | Disposition: A | Discharge status: Home / Self Care | Payer: Medicare Other | Attending: Physician Assistant | Admitting: Physician Assistant

## 2021-09-26 DIAGNOSIS — Z5321 Procedure and treatment not carried out due to patient leaving prior to being seen by health care provider: Secondary | ICD-10-CM | POA: Insufficient documentation

## 2021-09-26 DIAGNOSIS — N5089 Other specified disorders of the male genital organs: Secondary | ICD-10-CM | POA: Diagnosis not present

## 2021-09-26 LAB — HEPATIC FUNCTION PANEL
ALT: 20 U/L (ref 0–44)
AST: 22 U/L (ref 15–41)
Albumin: 3.2 g/dL — ABNORMAL LOW (ref 3.5–5.0)
Alkaline Phosphatase: 118 U/L (ref 38–126)
Bilirubin, Direct: 0.3 mg/dL — ABNORMAL HIGH (ref 0.0–0.2)
Indirect Bilirubin: 0.8 mg/dL (ref 0.3–0.9)
Total Bilirubin: 1.1 mg/dL (ref 0.3–1.2)
Total Protein: 8.1 g/dL (ref 6.5–8.1)

## 2021-09-26 LAB — CBC
HCT: 28.7 % — ABNORMAL LOW (ref 39.0–52.0)
Hemoglobin: 8.9 g/dL — ABNORMAL LOW (ref 13.0–17.0)
MCH: 30.7 pg (ref 26.0–34.0)
MCHC: 31 g/dL (ref 30.0–36.0)
MCV: 99 fL (ref 80.0–100.0)
Platelets: 201 10*3/uL (ref 150–400)
RBC: 2.9 MIL/uL — ABNORMAL LOW (ref 4.22–5.81)
RDW: 16.5 % — ABNORMAL HIGH (ref 11.5–15.5)
WBC: 10.5 10*3/uL (ref 4.0–10.5)
nRBC: 0 % (ref 0.0–0.2)

## 2021-09-26 LAB — BASIC METABOLIC PANEL
Anion gap: 25 — ABNORMAL HIGH (ref 5–15)
BUN: 93 mg/dL — ABNORMAL HIGH (ref 6–20)
CO2: 13 mmol/L — ABNORMAL LOW (ref 22–32)
Calcium: 8.7 mg/dL — ABNORMAL LOW (ref 8.9–10.3)
Chloride: 103 mmol/L (ref 98–111)
Creatinine, Ser: 16.01 mg/dL — ABNORMAL HIGH (ref 0.61–1.24)
GFR, Estimated: 3 mL/min — ABNORMAL LOW (ref >60)
Glucose, Bld: 94 mg/dL (ref 70–99)
Potassium: 5.8 mmol/L — ABNORMAL HIGH (ref 3.5–5.1)
Sodium: 141 mmol/L (ref 135–145)

## 2021-09-26 NOTE — ED Notes (Addendum)
Blood work collected and sent to lab.   Lab called d/t pt being hard stick in case more blood work was needed.

## 2021-09-26 NOTE — ED Provider Triage Note (Addendum)
Emergency Medicine Provider Triage Evaluation Note  Jordan [name] , a 56 y.o. male  was evaluated in triage.  Pt complains of weakness, pain.  Patient is dialysis patient and has not had dialysis since Monday.  States that he could not complete dialysis due to the pain..  Patient does state he has a large amount of swelling in the scrotal area which is causing blisters.  Review of Systems  Positive: Dialysis patient, pain, weakness Negative: Fever, chills, COVID exposure  Physical Exam  BP 117/86 (BP Location: Left Arm)    Pulse 97    Temp (!) 97.5 F (36.4 C) (Oral)    Resp 20    Ht 5\' 4"  (1.626 m)    Wt 74.8 kg    SpO2 94%    BMI 28.32 kg/m  Gen:   Awake, no distress   Resp:  Normal effort  MSK:   Moves extremities without difficulty  Other:    Medical Decision Making  Medically screening exam initiated at 3:54 PM.  Appropriate orders placed.  [name] was informed that the remainder of the evaluation will be completed by another provider, this initial triage assessment does not replace that evaluation, and the importance of remaining in the ED until their evaluation is complete.     Versie Starks, PA-C 09/26/21 1555    Versie Starks, PA-C 09/26/21 1556

## 2021-09-26 NOTE — ED Triage Notes (Addendum)
Pt brought in via ems [facility].  Pt has swelling of the scrotum and blisters on the penis..  Last dialysis was 3 days and was not complete.  Pt alert   speech clear.   Pt also has a sore area to right forehead area.  No known injury.

## 2021-09-26 NOTE — ED Triage Notes (Signed)
Pt comes into the ED via EMS from a neighbors house with c/o swelling in testicles and blisters around his perineal area for several months,   [facility]

## 2021-09-27 ENCOUNTER — Emergency Department (HOSPITAL_COMMUNITY)
Admission: EM | Admit: 2021-09-27 | Discharge: 2021-09-27 | Disposition: A | Discharge status: Home / Self Care | Payer: Medicare Other | Attending: Emergency Medicine | Admitting: Emergency Medicine

## 2021-09-27 ENCOUNTER — Other Ambulatory Visit: Payer: Self-pay

## 2021-09-27 ENCOUNTER — Emergency Department (HOSPITAL_COMMUNITY): Payer: Medicare Other

## 2021-09-27 ENCOUNTER — Encounter (HOSPITAL_COMMUNITY): Payer: Self-pay | Admitting: Emergency Medicine

## 2021-09-27 DIAGNOSIS — Z992 Dependence on renal dialysis: Secondary | ICD-10-CM | POA: Insufficient documentation

## 2021-09-27 DIAGNOSIS — Z79899 Other long term (current) drug therapy: Secondary | ICD-10-CM | POA: Insufficient documentation

## 2021-09-27 DIAGNOSIS — Z20822 Contact with and (suspected) exposure to covid-19: Secondary | ICD-10-CM | POA: Insufficient documentation

## 2021-09-27 DIAGNOSIS — N492 Inflammatory disorders of scrotum: Secondary | ICD-10-CM | POA: Diagnosis present

## 2021-09-27 DIAGNOSIS — Z7982 Long term (current) use of aspirin: Secondary | ICD-10-CM | POA: Diagnosis not present

## 2021-09-27 DIAGNOSIS — N186 End stage renal disease: Secondary | ICD-10-CM | POA: Diagnosis not present

## 2021-09-27 DIAGNOSIS — E875 Hyperkalemia: Secondary | ICD-10-CM | POA: Diagnosis not present

## 2021-09-27 DIAGNOSIS — N5089 Other specified disorders of the male genital organs: Secondary | ICD-10-CM

## 2021-09-27 LAB — RESP PANEL BY RT-PCR (FLU A&B, COVID) ARPGX2
Influenza A by PCR: NEGATIVE
Influenza B by PCR: NEGATIVE
SARS Coronavirus 2 by RT PCR: NEGATIVE

## 2021-09-27 LAB — I-STAT CHEM 8, ED
BUN: >130 mg/dL — ABNORMAL HIGH (ref 6–20)
Calcium, Ion: 1.05 mmol/L — ABNORMAL LOW (ref 1.15–1.40)
Chloride: 109 mmol/L (ref 98–111)
Creatinine, Ser: 18 mg/dL — ABNORMAL HIGH (ref 0.61–1.24)
Glucose, Bld: 98 mg/dL (ref 70–99)
HCT: 25 % — ABNORMAL LOW (ref 39.0–52.0)
Hemoglobin: 8.5 g/dL — ABNORMAL LOW (ref 13.0–17.0)
Potassium: 5.7 mmol/L — ABNORMAL HIGH (ref 3.5–5.1)
Sodium: 140 mmol/L (ref 135–145)
TCO2: 19 mmol/L — ABNORMAL LOW (ref 22–32)

## 2021-09-27 LAB — CBC WITH DIFFERENTIAL/PLATELET
Abs Immature Granulocytes: 0.19 10*3/uL — ABNORMAL HIGH (ref 0.00–0.07)
Basophils Absolute: 0 10*3/uL (ref 0.0–0.1)
Basophils Relative: 0 %
Eosinophils Absolute: 0.1 10*3/uL (ref 0.0–0.5)
Eosinophils Relative: 1 %
HCT: 26 % — ABNORMAL LOW (ref 39.0–52.0)
Hemoglobin: 8 g/dL — ABNORMAL LOW (ref 13.0–17.0)
Immature Granulocytes: 2 %
Lymphocytes Relative: 4 %
Lymphs Abs: 0.5 10*3/uL — ABNORMAL LOW (ref 0.7–4.0)
MCH: 31.6 pg (ref 26.0–34.0)
MCHC: 30.8 g/dL (ref 30.0–36.0)
MCV: 102.8 fL — ABNORMAL HIGH (ref 80.0–100.0)
Monocytes Absolute: 0.8 10*3/uL (ref 0.1–1.0)
Monocytes Relative: 7 %
Neutro Abs: 9.8 10*3/uL — ABNORMAL HIGH (ref 1.7–7.7)
Neutrophils Relative %: 86 %
Platelets: 186 10*3/uL (ref 150–400)
RBC: 2.53 MIL/uL — ABNORMAL LOW (ref 4.22–5.81)
RDW: 16.6 % — ABNORMAL HIGH (ref 11.5–15.5)
WBC: 11.5 10*3/uL — ABNORMAL HIGH (ref 4.0–10.5)
nRBC: 0 % (ref 0.0–0.2)

## 2021-09-27 LAB — COMPREHENSIVE METABOLIC PANEL
ALT: 16 U/L (ref 0–44)
AST: 16 U/L (ref 15–41)
Albumin: 3.1 g/dL — ABNORMAL LOW (ref 3.5–5.0)
Alkaline Phosphatase: 121 U/L (ref 38–126)
Anion gap: 20 — ABNORMAL HIGH (ref 5–15)
BUN: 138 mg/dL — ABNORMAL HIGH (ref 6–20)
CO2: 17 mmol/L — ABNORMAL LOW (ref 22–32)
Calcium: 8.4 mg/dL — ABNORMAL LOW (ref 8.9–10.3)
Chloride: 104 mmol/L (ref 98–111)
Creatinine, Ser: 16.75 mg/dL — ABNORMAL HIGH (ref 0.61–1.24)
GFR, Estimated: 3 mL/min — ABNORMAL LOW (ref >60)
Glucose, Bld: 105 mg/dL — ABNORMAL HIGH (ref 70–99)
Potassium: 5.8 mmol/L — ABNORMAL HIGH (ref 3.5–5.1)
Sodium: 141 mmol/L (ref 135–145)
Total Bilirubin: 0.9 mg/dL (ref 0.3–1.2)
Total Protein: 7.7 g/dL (ref 6.5–8.1)

## 2021-09-27 IMAGING — DX DG CHEST 1V PORT
1 series · 1 of 1 positions shown · non-contrast
Comparison: [DATE].

CLINICAL DATA: SOB

EXAM:
PORTABLE CHEST 1 VIEW

[chest ap]
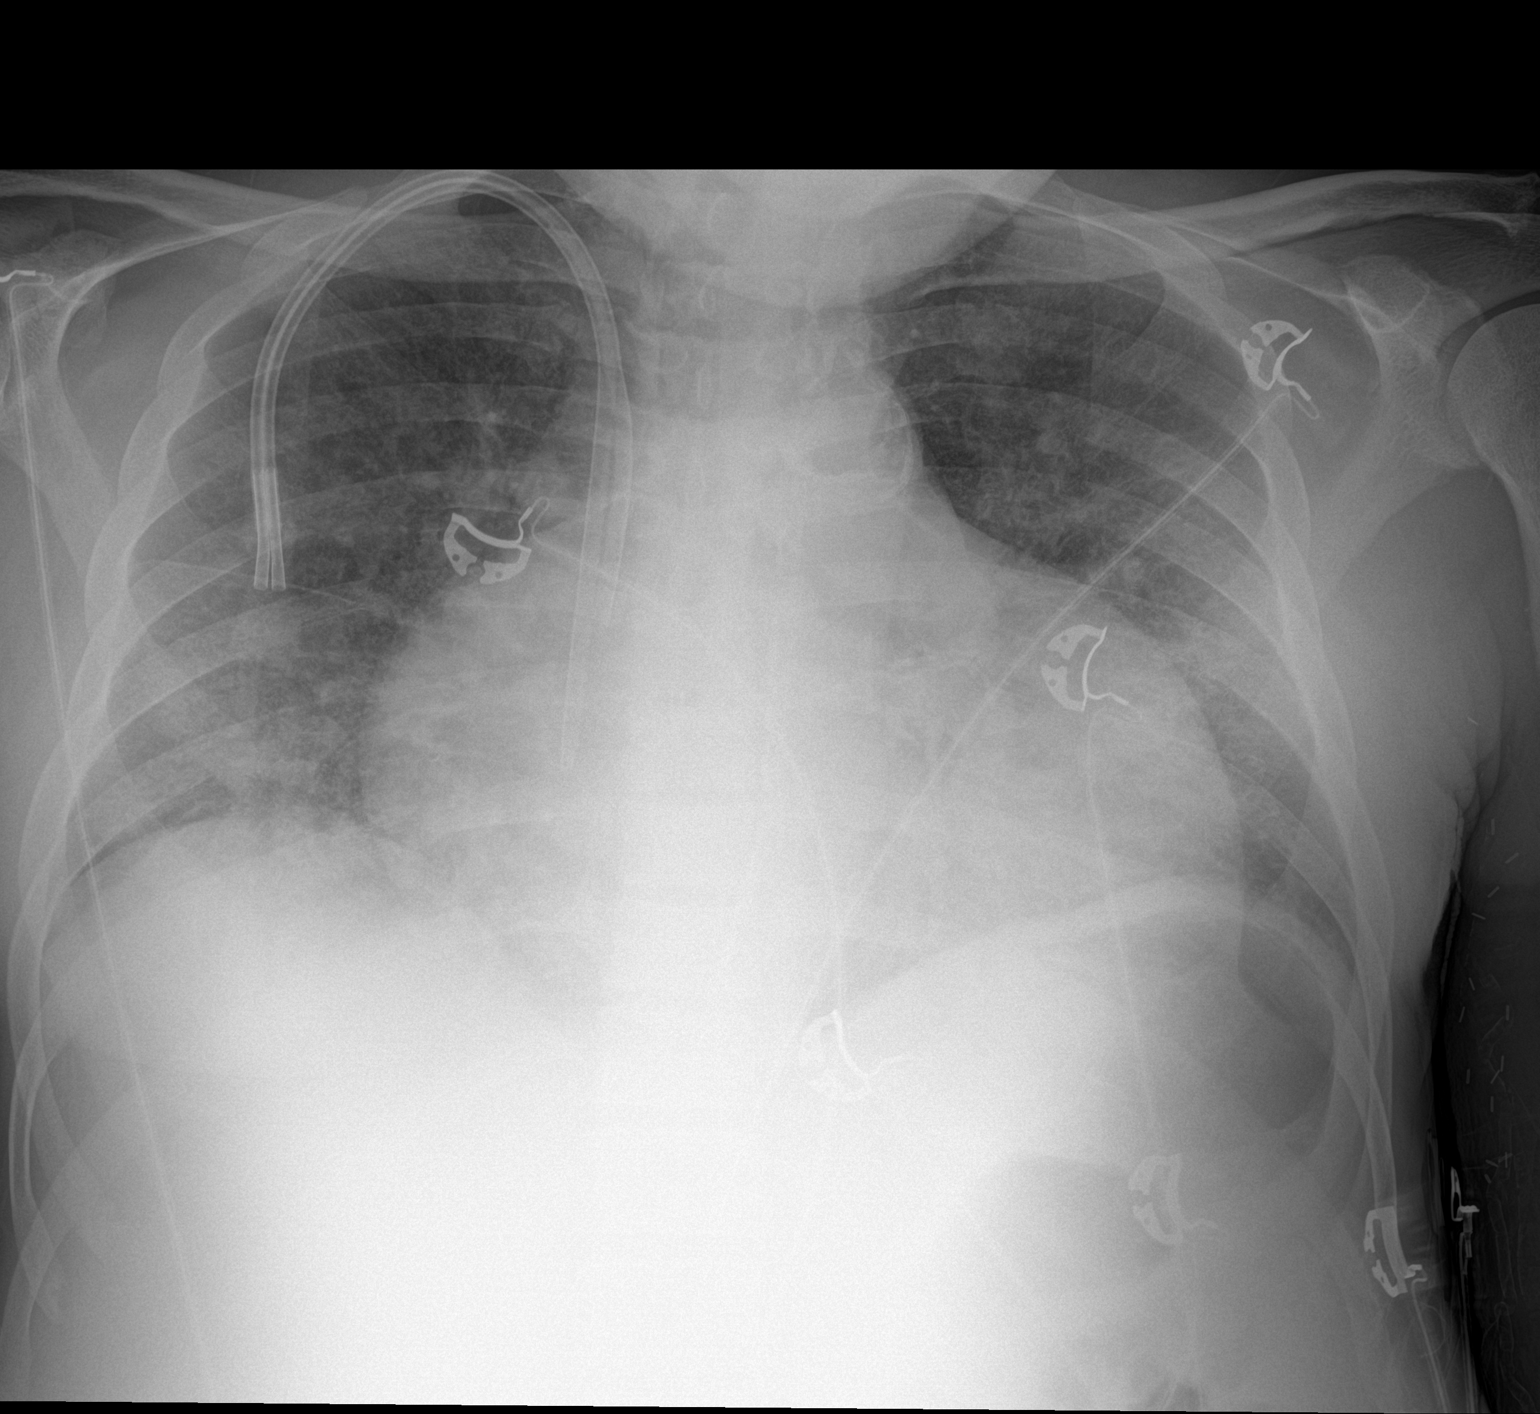

[1 of 1 positions shown; findings below may reference images not displayed]

FINDINGS: Enlargement of the cardiac silhouette, similar. Right IJ dual lumen
central venous catheter with the tip projecting at the right atrium.
Mild diffuse interstitial opacities. No confluent consolidation. No
visible pleural effusions or pneumothorax on this semi erect
radiograph.
IMPRESSION: 1. Mild diffuse interstitial opacities, potentially the sequela of
recurrent bouts of CHF or mild interstitial edema.
2. Chronic cardiomegaly.

## 2021-09-27 MED ORDER — HYDROXYZINE HCL 10 MG PO TABS
10.0000 mg | ORAL_TABLET | Freq: Once | ORAL | Status: AC
Start: 2021-09-27 — End: 2021-09-27
  Administered 2021-09-27: 10 mg via ORAL
  Filled 2021-09-27: qty 1

## 2021-09-27 MED ORDER — SODIUM BICARBONATE 8.4 % IV SOLN
50.0000 meq | Freq: Once | INTRAVENOUS | Status: AC
  Administered 2021-09-27: 50 meq via INTRAVENOUS
  Filled 2021-09-27: qty 50

## 2021-09-27 MED ORDER — SODIUM ZIRCONIUM CYCLOSILICATE 10 G PO PACK
10.0000 g | PACK | Freq: Once | ORAL | Status: AC
  Administered 2021-09-27: 10 g via ORAL
  Filled 2021-09-27: qty 1

## 2021-09-27 MED ORDER — CALCIUM GLUCONATE-NACL 1-0.675 GM/50ML-% IV SOLN
1.0000 g | Freq: Once | INTRAVENOUS | Status: AC
  Administered 2021-09-27: 1000 mg via INTRAVENOUS
  Filled 2021-09-27: qty 50

## 2021-09-27 NOTE — ED Provider Notes (Signed)
[facility] DEPT Provider Note   CSN: [id number] Arrival date & time: 09/27/21  1644     History  No chief complaint on file.   Jordan [name] is a 56 y.o. male history of ESRD on dialysis, who presented with scrotal swelling.  Patient has scrotal swelling and blisters on his legs.  He is a dialysis patient and missed 3 sessions this week.  Patient was told yesterday he has gonorrhea chlamydia [place] he was confused so came here for second opinion.  I reviewed the chart from yesterday at [facility], patient actually did not had GC chlamydia sent and there was nodiscussion about STDs.  Patient however did have chemistry sent and his potassium is 5.8.  Patient states that he just feels weak all over.  He states that his scrotum is very swollen.  He states that he actually had scrotal ultrasound recently and was in fact admitted about a week ago and had dialysis when he was in the hospital  The history is provided by the patient.      Home Medications Prior to Admission medications   Medication Sig Start Date End Date Taking? Authorizing Provider  albuterol (VENTOLIN HFA) 108 (90 Base) MCG/ACT inhaler Inhale 2 puffs into the lungs every 4 (four) hours as needed for wheezing or shortness of breath. Patient not taking: Reported on 09/15/2021 06/05/21   Edwin Dada, MD  albuterol (VENTOLIN HFA) 108 (90 Base) MCG/ACT inhaler Inhale 1 puff into the lungs every 6 (six) hours. 09/19/21   Regalado, Jerald Kief A, MD  aspirin 81 MG chewable tablet Chew 1 tablet (81 mg total) by mouth daily. 09/20/21   Regalado, Belkys A, MD  calcium acetate (PHOSLO) 667 MG capsule Take 2 capsules (1,334 mg total) by mouth 3 (three) times daily with meals. 06/05/21   Danford, Suann Larry, MD  carvedilol (COREG) 3.125 MG tablet Take 1 tablet (3.125 mg total) by mouth 2 (two) times daily with a meal. 09/19/21   Regalado, Belkys A, MD  cinacalcet (SENSIPAR) 30 MG tablet Take 1 tablet (30  mg total) by mouth daily with breakfast. 09/19/21   Regalado, Jerald Kief A, MD  epoetin alfa (EPOGEN) 4000 UNIT/ML injection Inject 1 mL (4,000 Units total) into the vein every Monday, Wednesday, and Friday with hemodialysis. 06/22/20   Enzo Bi, MD  ferrous sulfate 325 (65 FE) MG tablet Take 1 tablet (325 mg total) by mouth 2 (two) times daily with a meal. Patient not taking: Reported on 09/15/2021 06/05/21   Edwin Dada, MD  isosorbide-hydrALAZINE (BIDIL) 20-37.5 MG tablet Take 1 tablet by mouth 3 (three) times daily. 09/19/21   Regalado, Belkys A, MD  rosuvastatin (CRESTOR) 10 MG tablet Take 1 tablet (10 mg total) by mouth daily. 09/20/21   Regalado, Cassie Freer, MD      Allergies    Lisinopril    Review of Systems   Review of Systems  Genitourinary:  Positive for scrotal swelling.  All other systems reviewed and are negative.  Physical Exam Updated Vital Signs There were no vitals taken for this visit. Physical Exam Vitals and nursing note reviewed.  Constitutional:      Comments: Chronically ill-appearing  HENT:     Head: Normocephalic.     Nose: Nose normal.     Mouth/Throat:     Mouth: Mucous membranes are moist.  Eyes:     Extraocular Movements: Extraocular movements intact.     Pupils: Pupils are equal, round, and reactive to light.  Cardiovascular:     Rate and Rhythm: Normal rate and regular rhythm.     Pulses: Normal pulses.     Heart sounds: Normal heart sounds.  Pulmonary:     Effort: Pulmonary effort is normal.     Breath sounds: Normal breath sounds.  Abdominal:     General: Abdomen is flat.     Palpations: Abdomen is soft.  Genitourinary:    Comments: Scrotum is swollen but no obvious erythema.  No obvious penile discharge. Musculoskeletal:        General: Normal range of motion.     Cervical back: Normal range of motion and neck supple.  Skin:    Capillary Refill: Capillary refill takes less than 2 seconds.     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  No obvious bite marks or signs of cellulitis  Neurological:     General: No focal deficit present.     Mental Status: He is oriented to person, place, and time.  Psychiatric:        Mood and Affect: Mood normal.        Behavior: Behavior normal.    ED Results / Procedures / Treatments   Labs (all labs ordered are listed, but only abnormal results are displayed) Labs Reviewed  CBC WITH DIFFERENTIAL/PLATELET  COMPREHENSIVE METABOLIC PANEL  I-STAT CHEM 8, ED    EKG None  Radiology No results found.  Procedures Procedures    Angiocath insertion Performed by: Wandra Arthurs  Consent: Verbal consent obtained. Risks and benefits: risks, benefits and alternatives were discussed Time out: Immediately prior to procedure a "time out" was called to verify the correct patient, procedure, equipment, support staff and site/side marked as required.  Preparation: Patient was prepped and draped in the usual sterile fashion.  Vein Location: R brachial  Ultrasound Guided  Gauge: 20 long   Normal blood return and flush without difficulty Patient tolerance: Patient tolerated the procedure well with no immediate complications.    Medications Ordered in ED Medications - No data to display  ED Course/ Medical Decision Making/ A&P                           Medical Decision Making Jordan [name] is a 56 y.o. male hx of ESRD on HD (missed 3 sessions, last HD was in the hospital 7 days ago), here presenting with scrotal swelling.  Patient has no obvious signs of scrotal cellulitis.  I think scrotal swelling likely secondary to fluid overload from not getting dialysis.  We will get labs and will likely need to get patient dialyzed.  Patient's lesions on his legs appear chronic and has no obvious cellulitis and I doubt STDs.    Amount and/or Complexity of Data Reviewed Independent Historian: parent External Data Reviewed: labs and  radiology. Labs: ordered. Decision-making details documented in ED Course.    Details: Potassium is 5.7 Radiology: ordered and independent interpretation performed. ECG/medicine tests: ordered and independent interpretation performed. Discussion of management or test interpretation with external provider(s): 7:59 PM Patient's potassium is 5.7. I discussed with Dr. Royce Macadamia from nephrology.  Initial plan was to try to admit the patient for dialysis.  However patient will require transfer to [facility] and there is no room for dialysis tonight at [facility].  The fastest they can dialyze him would be tomorrow or Sunday.  Nephrology was able to contact his outpatient dialysis center to get him in  for dialysis tomorrow. Patient was given bicarb and calcium and Lokelma for hyperkalemia.  Case management was able to get him a cab voucher to go home.    ssion(s) / ED Diagnoses Final diagnoses:  None    Rx / DC Orders ED Discharge Orders     None         Drenda Freeze, MD 09/27/21 2002

## 2021-09-27 NOTE — Progress Notes (Signed)
CSW spoke with Dr. Darl Householder who is requesting assistance with patient's transportation needs.  CSW spoke with ED secretary Lattie Haw to request she assist with calling patient a cab when he is ready for discharge and she was agreeable.  Madilyn Fireman, MSW, LCSW Transitions of Care   Clinical Social Worker II [phone]

## 2021-09-27 NOTE — ED Triage Notes (Signed)
Patient arrived via EMS with concerns of blisters on his scrotum. He was recently seen at [facility] for this same concern. He presented here today for a second opinion. He reported to EMS that he believes he hasn't engaged in sexual intercourse in a while.

## 2021-09-27 NOTE — Discharge Instructions (Addendum)
Your potassium is high.  You were given treatment for it today but you need to go to dialysis tomorrow. The nephrologist contacted your dialysis center and you should be able to go tomorrow   See your doctor.   Return to ER if you have worse scrotal swelling, worse rash, chest pain, shortness of breath

## 2021-10-23 DEATH — deceased

## 2021-12-10 ENCOUNTER — Encounter (HOSPITAL_COMMUNITY): Payer: Self-pay | Admitting: Radiology
